# Patient Record
Sex: Male | Born: 1950 | Race: White | Hispanic: No | Smoking: Current some day smoker
Health system: Southern US, Community
[De-identification: ages and names within clinical notes are randomized; demographics above are authoritative.]

## PROBLEM LIST (undated history)

## (undated) DIAGNOSIS — I255 Ischemic cardiomyopathy: Secondary | ICD-10-CM

## (undated) DIAGNOSIS — F419 Anxiety disorder, unspecified: Secondary | ICD-10-CM

## (undated) DIAGNOSIS — E119 Type 2 diabetes mellitus without complications: Secondary | ICD-10-CM

## (undated) DIAGNOSIS — K420 Umbilical hernia with obstruction, without gangrene: Secondary | ICD-10-CM

## (undated) DIAGNOSIS — IMO0001 Reserved for inherently not codable concepts without codable children: Secondary | ICD-10-CM

## (undated) DIAGNOSIS — I252 Old myocardial infarction: Secondary | ICD-10-CM

## (undated) DIAGNOSIS — I2510 Atherosclerotic heart disease of native coronary artery without angina pectoris: Secondary | ICD-10-CM

## (undated) DIAGNOSIS — K08109 Complete loss of teeth, unspecified cause, unspecified class: Secondary | ICD-10-CM

## (undated) DIAGNOSIS — J449 Chronic obstructive pulmonary disease, unspecified: Secondary | ICD-10-CM

## (undated) DIAGNOSIS — I509 Heart failure, unspecified: Secondary | ICD-10-CM

## (undated) DIAGNOSIS — I480 Paroxysmal atrial fibrillation: Secondary | ICD-10-CM

## (undated) DIAGNOSIS — L03116 Cellulitis of left lower limb: Secondary | ICD-10-CM

## (undated) DIAGNOSIS — Z972 Presence of dental prosthetic device (complete) (partial): Secondary | ICD-10-CM

## (undated) DIAGNOSIS — E7800 Pure hypercholesterolemia, unspecified: Secondary | ICD-10-CM

## (undated) DIAGNOSIS — K4090 Unilateral inguinal hernia, without obstruction or gangrene, not specified as recurrent: Secondary | ICD-10-CM

## (undated) DIAGNOSIS — U071 COVID-19: Secondary | ICD-10-CM

## (undated) DIAGNOSIS — I10 Essential (primary) hypertension: Secondary | ICD-10-CM

## (undated) DIAGNOSIS — M069 Rheumatoid arthritis, unspecified: Secondary | ICD-10-CM

## (undated) DIAGNOSIS — Z794 Long term (current) use of insulin: Secondary | ICD-10-CM

## (undated) DIAGNOSIS — K219 Gastro-esophageal reflux disease without esophagitis: Secondary | ICD-10-CM

## (undated) DIAGNOSIS — M359 Systemic involvement of connective tissue, unspecified: Secondary | ICD-10-CM

## (undated) HISTORY — PX: LUMBAR LAMINECTOMY: SHX95

## (undated) HISTORY — PX: CORONARY ANGIOPLASTY: SHX604

## (undated) HISTORY — PX: CARDIAC CATHETERIZATION: SHX172

## (undated) HISTORY — PX: LAPAROSCOPIC CHOLECYSTECTOMY: SUR755

## (undated) HISTORY — DX: Ischemic cardiomyopathy: I25.5

---

## 2002-09-01 ENCOUNTER — Encounter: Payer: Self-pay | Admitting: Emergency Medicine

## 2002-09-01 ENCOUNTER — Emergency Department (HOSPITAL_COMMUNITY): Admission: EM | Admit: 2002-09-01 | Discharge: 2002-09-01 | Payer: Self-pay | Admitting: Emergency Medicine

## 2002-09-05 ENCOUNTER — Ambulatory Visit (HOSPITAL_COMMUNITY): Admission: RE | Admit: 2002-09-05 | Discharge: 2002-09-05 | Payer: Self-pay | Admitting: Cardiovascular Disease

## 2003-03-07 ENCOUNTER — Encounter: Payer: Self-pay | Admitting: *Deleted

## 2003-03-07 ENCOUNTER — Inpatient Hospital Stay (HOSPITAL_COMMUNITY): Admission: EM | Admit: 2003-03-07 | Discharge: 2003-03-10 | Payer: Self-pay | Admitting: *Deleted

## 2003-03-08 ENCOUNTER — Encounter: Payer: Self-pay | Admitting: *Deleted

## 2003-03-27 ENCOUNTER — Ambulatory Visit (HOSPITAL_COMMUNITY): Admission: RE | Admit: 2003-03-27 | Discharge: 2003-03-27 | Payer: Self-pay | Admitting: Cardiology

## 2005-04-10 ENCOUNTER — Ambulatory Visit (HOSPITAL_COMMUNITY): Admission: RE | Admit: 2005-04-10 | Discharge: 2005-04-10 | Payer: Self-pay | Admitting: Cardiology

## 2005-06-01 ENCOUNTER — Encounter: Payer: Self-pay | Admitting: Emergency Medicine

## 2005-06-02 ENCOUNTER — Inpatient Hospital Stay (HOSPITAL_COMMUNITY): Admission: AD | Admit: 2005-06-02 | Discharge: 2005-06-04 | Payer: Self-pay | Admitting: Cardiology

## 2006-04-17 ENCOUNTER — Ambulatory Visit: Payer: Self-pay | Admitting: Internal Medicine

## 2006-04-20 ENCOUNTER — Ambulatory Visit (HOSPITAL_COMMUNITY): Admission: RE | Admit: 2006-04-20 | Discharge: 2006-04-20 | Payer: Self-pay | Admitting: Cardiology

## 2006-05-09 ENCOUNTER — Inpatient Hospital Stay (HOSPITAL_COMMUNITY): Admission: RE | Admit: 2006-05-09 | Discharge: 2006-05-10 | Payer: Self-pay | Admitting: Cardiology

## 2006-09-11 HISTORY — PX: COLONOSCOPY: SHX174

## 2006-12-28 ENCOUNTER — Ambulatory Visit (HOSPITAL_COMMUNITY): Admission: RE | Admit: 2006-12-28 | Discharge: 2006-12-28 | Payer: Self-pay | Admitting: Family Medicine

## 2007-02-09 ENCOUNTER — Inpatient Hospital Stay (HOSPITAL_COMMUNITY): Admission: EM | Admit: 2007-02-09 | Discharge: 2007-02-11 | Payer: Self-pay | Admitting: Emergency Medicine

## 2007-02-26 ENCOUNTER — Ambulatory Visit (HOSPITAL_COMMUNITY): Admission: RE | Admit: 2007-02-26 | Discharge: 2007-02-26 | Payer: Self-pay | Admitting: *Deleted

## 2007-03-01 ENCOUNTER — Ambulatory Visit (HOSPITAL_COMMUNITY): Admission: RE | Admit: 2007-03-01 | Discharge: 2007-03-01 | Payer: Self-pay | Admitting: Cardiology

## 2007-03-12 ENCOUNTER — Encounter (HOSPITAL_COMMUNITY): Admission: RE | Admit: 2007-03-12 | Discharge: 2007-04-11 | Payer: Self-pay | Admitting: Cardiology

## 2007-04-12 ENCOUNTER — Encounter (HOSPITAL_COMMUNITY): Admission: RE | Admit: 2007-04-12 | Discharge: 2007-05-12 | Payer: Self-pay | Admitting: Cardiology

## 2007-04-29 ENCOUNTER — Ambulatory Visit (HOSPITAL_COMMUNITY): Admission: RE | Admit: 2007-04-29 | Discharge: 2007-04-29 | Payer: Self-pay | Admitting: Internal Medicine

## 2007-04-29 ENCOUNTER — Ambulatory Visit: Payer: Self-pay | Admitting: Internal Medicine

## 2007-04-29 ENCOUNTER — Encounter: Payer: Self-pay | Admitting: Internal Medicine

## 2009-08-30 HISTORY — PX: NM MYOCAR PERF WALL MOTION: HXRAD629

## 2010-01-27 ENCOUNTER — Ambulatory Visit (HOSPITAL_COMMUNITY): Admission: RE | Admit: 2010-01-27 | Discharge: 2010-01-27 | Payer: Self-pay | Admitting: Cardiology

## 2010-03-16 ENCOUNTER — Ambulatory Visit: Payer: Self-pay | Admitting: Orthopedic Surgery

## 2010-03-16 DIAGNOSIS — M67919 Unspecified disorder of synovium and tendon, unspecified shoulder: Secondary | ICD-10-CM

## 2010-03-16 DIAGNOSIS — M719 Bursopathy, unspecified: Secondary | ICD-10-CM

## 2010-04-04 ENCOUNTER — Encounter: Payer: Self-pay | Admitting: Orthopedic Surgery

## 2010-10-11 NOTE — Assessment & Plan Note (Signed)
Summary: left shoulder pain needs xr/medicaare/mcgough/bsf   Vital Signs:  Patient profile:   60 year old male Height:      73 inches Weight:      231 pounds Pulse rate:   78 / minute Resp:     16 per minute  Vitals Entered By: Fuller Canada MD (March 16, 2010 9:19 AM)  Visit Type:  new patient Referring Provider:  Dr. Regino Schultze Primary Provider:  Dr. Sherwood Gambler  CC:  left shoulder pain.  History of Present Illness: I saw Robert [name] in the office today for an initial visit.  He is a 60 years old man with the complaint of:  left shoulder pain.  xrays today in our office.  Meds: Metformin, Ticlopidine, Quinapril, Niaspan, Simvastatin, Meloxicam, Cilostazol, Metoprolol, Actos, Glipizide, Hydro 10/650, Nitro as needed, Omega 3, ASA.  Patient today complains of 3-4 months of pain in his LEFT shoulder with no major radiation below the elbow and associated weakness and pain with forward elevation.  His pain is unrelieved by hydrocodone 10 mg.  He denies any trauma.    Allergies (verified): 1)  ! * Ivp Dye  Past History:  Past Medical History: cardiovascular disease diabetes COPD cholesterol  Past Surgical History: L3-5 lumbar circumcision gallbladder 3 heart stents  Family History: FH of Cancer:  Family History of Diabetes Family History Coronary Heart Disease male < 76 Family History of Arthritis Hx, family, chronic respiratory condition  Social History: Patient is married.  disabled smokes 1ppd no alcohol 1 cup of coffee per day  Review of Systems Constitutional:  Denies weight loss, weight gain, fever, chills, and fatigue. Cardiovascular:  Complains of chest pain; denies palpitations, fainting, and murmurs. Respiratory:  Complains of short of breath, wheezing, couch, and snoring; denies tightness, pain on inspiration, and snoring . Gastrointestinal:  Complains of heartburn and diarrhea; denies nausea, vomiting, constipation, and blood in your  stools. Genitourinary:  Denies frequency, urgency, difficulty urinating, painful urination, flank pain, and bleeding in urine. Neurologic:  Complains of numbness, tingling, and unsteady gait; denies dizziness, tremors, and seizure. Musculoskeletal:  Complains of joint pain, instability, and stiffness; denies swelling, redness, heat, and muscle pain. Endocrine:  Denies excessive thirst, exessive urination, and heat or cold intolerance. Psychiatric:  Denies nervousness, depression, anxiety, and hallucinations. Skin:  Denies changes in the skin, poor healing, rash, itching, and redness. HEENT:  Denies blurred or double vision, eye pain, redness, and watering. Immunology:  Complains of seasonal allergies; denies sinus problems and allergic to bee stings. Hemoatologic:  Denies easy bleeding and brusing.  Physical Exam  Skin:  intact without lesions or rashes Cervical Nodes:  no significant adenopathy Psych:  alert and cooperative; normal mood and affect; normal attention span and concentration   Shoulder/Elbow Exam  General:    Well-developed, well-nourished, normal body habitus; no deformities, normal grooming.    Vascular:    Radial, ulnar, brachial, and axillary pulses 2+ and symmetric; capillary refill less than 2 seconds; no evidence of ischemia, clubbing, or cyanosis.    Motor:    Normal strength in the upper extremities.    Reflexes:    Normal reflexes in the upper extremities.    Shoulder Exam:    the patient has approximately 4 elevation of the RIGHT upper extremity and painful elevation of the LEFT upper extremity at the shoulder joint at 80 his passive range of motion is 150 and is painful arc is from 80 up to 150 has a positive impingement sign with Neer  negative with Hawkins strength is normal shoulder is stable no apprehension   Impression & Recommendations: shoulder x-rays LEFT shoulder AP lateral type I acromion normal glenohumeral joint normal arch between the  scapula and the proximal humerus  Shoulder injection LEFT shoulder subacromial space Verbal consent obtained/The shoulder was injected with depomedrol 40mg /cc and sensorcaine .25% . There were no complications  Other Orders: New Patient Level III (16109) Joint Aspirate / Injection, Large (20610) Depo- Medrol 40mg  (J1030) Shoulder x-ray,  minimum 2 views (60454)  Patient Instructions: 1)  You have received an injection of cortisone today. You may experience increased pain at the injection site. Apply ice pack to the area for 20 minutes every 2 hours and take 2 xtra strength tylenol every 8 hours. This increased pain will usually resolve in 24 hours. The injection will take effect in 3-10 days.

## 2010-10-11 NOTE — Letter (Signed)
Summary: No show for appointment  No show for appointment   Imported By: Cammie Sickle 04/12/2010 17:13:09  _____________________________________________________________________  External Attachment:    Type:   Image     Comment:   External Document

## 2010-10-11 NOTE — Letter (Signed)
Summary: History form  History form   Imported By: Jacklynn Ganong 03/18/2010 07:36:31  _____________________________________________________________________  External Attachment:    Type:   Image     Comment:   External Document

## 2011-01-24 NOTE — Cardiovascular Report (Signed)
NAMEDEMONE, LYLES NO.:  [id number]   MEDICAL RECORD NO.:  [id number]          PATIENT TYPE:  OIB   LOCATION:  2856                         FACILITY:  [facility]   PHYSICIAN:  Madaline Savage, M.D.DATE OF BIRTH:  05-06-1951   DATE OF PROCEDURE:  DATE OF DISCHARGE:                            CARDIAC CATHETERIZATION   PROCEDURES PERFORMED:  1. Selective coronary angiography by Judkins technique.  2. Retrograde left heart catheterization.  3. Left ventricular angiography.   COMPLICATIONS:  None.   ENTRY SITE:  Right femoral.   DYE USED:  Omnipaque.   PATIENT PROFILE:  The patient is a 60 year old gentleman who has had  previous coronary artery stenting of the circumflex coronary artery and  has an allergy to PLAVIX and he therefore takes ticlopidine.  He had  emergency cardiac catheterization on 02/09/2007 during acute myocardial  infarction and had acute thrombosis of the mid left circumflex coronary  artery which was in-stent.  Thrombolysis was performed and he was  ultimately discharged from the hospital improve in improved condition.  I saw him in the [place] office, [facility] a few days ago and he was complaining about more right-sided  chest pain.  The patient has a history of smoking.  He has been on  Chantix for his tobacco use and he has  about quit.  The patient is  brought to the cath lab today electively to re-study his coronary  anatomy in view of the recent of the recent problems he has had with  chest pain.  This case went well.  There were no complications and the  patient ultimately left the cath lab with stable vital signs to go to  the holding unit.   RESULTS:  Pressures:  Left ventricular pressure was 110/4.  End  diastolic pressure 10.  Central aortic pressure 105/60, mean of 85, no  aortic valve gradient by pullback technique.   ANGIOGRAPHIC RESULTS:  The left circumflex and left anterior descending  coronary arteries arise from different ostia.  The left Judkins catheter  fits into the circumflex well and selective visualization of that  circumflex is then noted.  There is an old patent stent in the proximal  circumflex before obtuse marginal branch #1.  There is a second stent in  the mid RCA between OM-1 and OM-2 that was the site of the patient's  recent thrombolysis and new stent deployment 2 weeks ago.  That area  looks pristine.  The distal vessel appears basically normal except for  the largest of 3 obtuse marginal branches which is the second branch  showing about a 50% ostial stenosis.  There is collateral flow from the  proximal circumflex to the distal right coronary artery.  There is also  collateralization of the RCA by way of LAD to RCA collaterals.   The left anterior descending coronary artery arising via separate ostia  can also be intubated with a Judkins catheter oriented superiorly and  rotated in a different axis.  The LAD fills well.  I do not see any  significant lesions.  There  is some haziness proximally which I do not  think is significant.  There is one major diagonal branch which appears  normal and the LAD itself looks very normal with large septal perforator  branches.   The right coronary artery is a severely diseased vessel.  The vessel is  essentially occluded at the ostium with homocollaterals that then fill  bits  and pieces of the distal RCA, pulmonary conus and acute marginal.  There is not much significant antegrade flow into the distal RCA in a  antegrade fashion, however.   Left ventricular angiography shows good contractility of all wall  segments, a very, very mild mid inferior wall hypokinesis area and no  evidence of mitral regurgitation.   FINAL IMPRESSIONS:  1. Two-vessel coronary disease      a.     50% obtuse marginal branch #2 stenosis proximally with       patent proximal circumflex and mid circumflex stents.      b.     100%  occluded nondominant right coronary artery at the       ostium with homocollateral flow to the distal RCA, collateral flow       from circumflex to RCA collaterals and also LAD to distal RCA       collaterals d      c.     Patent LAD and diagonal.  2. Mildly depressed LV systolic ejection fraction 50-60%.  3. Acute myocardial infarction 2 weeks ago already showing improvement      in ejection fraction.   PLAN:  Continued medical therapy, continued attempts to try to get the  patient completely off tobacco.  The patient is apparently in the  process of pursuing Social Security disability benefits.  We will see  the patient back in our office July 10 in [place],  [place].           ______________________________  Madaline Savage, M.D.     WHG/MEDQ  D:  03/01/2007  T:  03/01/2007  Job:  [id number]   cc:   in [facility]  [place], [facility],

## 2011-01-24 NOTE — Op Note (Signed)
NAMEJAMARIO, COLINA                ACCOUNT NO.:  [id number]   MEDICAL RECORD NO.:  [id number]          PATIENT TYPE:  AMB   LOCATION:  DAY                           FACILITY:  [facility]   PHYSICIAN:  R. Roetta Sessions, M.D. DATE OF BIRTH:  23-Jun-1951   DATE OF PROCEDURE:  04/29/2007  DATE OF DISCHARGE:                               OPERATIVE REPORT   PROCEDURE:  High-risk screening colonoscopy, colonoscopy with biopsy.   INDICATIONS FOR PROCEDURE:  A 60 year old gentleman with no lower GI  tract symptoms.  He comes for high-risk screening colonoscopy.  He has  never had his lower GI tract imaged, positive family history in both a  brother and mother (brother was diagnosed at age 33).  Colonoscopy is  now being done.  This approach has been discussed the patient at length.  The potential risks, benefits and alternatives have been reviewed,  questions answered.  Please see the documentation in the medical record.   PROCEDURE NOTE:  O2 saturation, blood pressure, pulse and respirations  were monitored throughout the entire procedure.   CONSCIOUS SEDATION:  Demerol 100 mg IV, Versed 5 mg IV in divided doses.   INSTRUMENT:  Pentax video chip system.   FINDINGS:  Digital rectal exam revealed no abnormalities.   ENDOSCOPIC FINDINGS:  The prep was good.   COLON:  The colonic mucosa was surveyed from the rectosigmoid junction  through the left transverse and right colon to the area of the  appendiceal orifice, ileocecal valve and cecum.  These structures were  well seen and photographed for the record.  From this level, the scope  was slowly withdrawn.  All previously mentioned mucosal surfaces were  again seen. The patient was noted to have a few scattered pan colonic  diverticula.  The remainder of the colonic mucosa appeared normal aside  from two diminutive rectosigmoid polyps which were cold  biopsied/removed. The scope was pulled down in the rectum where a  thorough examination of the  rectal mucosa including a retroflexed view  of the anal verge demonstrated no abnormalities.  The patient tolerated  the procedure well and was reacted in endoscopy.   IMPRESSION:  1. Normal rectum, two diminutive polyps of the rectosigmoid  junction (distal sigmoid) status post cold biopsy removal.  1. A few scattered pan colonic diverticula.  Colon mucosa appeared      normal.   RECOMMENDATIONS:  1. Resume Ticlid and aspirin today.  2. Follow-up on path.  3. Diverticulosis literature provided to Mr. Salemi.  4. Would consider family members undergo genetic testing to see if      there is some hereditary colon cancer syndrome gene lurking in the      family tree.  5. Will make further recommendations once the path becomes available.      Jonathon Bellows, M.D.  Electronically Signed     RMR/MEDQ  D:  04/29/2007  T:  04/29/2007  Job:  [id number]   cc:   Madaline Savage, M.D.  Fax: [phone]   Madelin Rear. Sherwood Gambler, MD  Fax: [phone]

## 2011-01-24 NOTE — Cardiovascular Report (Signed)
Robert [name], Robert [name] NO.:  [id number]   MEDICAL RECORD NO.:  [id number]          PATIENT TYPE:  INP   LOCATION:  1828                         FACILITY:  [facility]   PHYSICIAN:  Madaline Savage, M.D.DATE OF BIRTH:  05/16/1951   DATE OF PROCEDURE:  02/09/2007  DATE OF DISCHARGE:                            CARDIAC CATHETERIZATION   PROCEDURES PERFORMED:  1. Selective coronary angiography by Judkins technique.  2. Retrograde left heart catheterization.  3. Left ventricular angiography.  4. Acute thrombolysis with Fetch device.  5. Balloon angioplasty of mid-circumflex coronary artery for in-stent      thrombolysis, right percutaneous femoral artery Angio-Seal closure.   COMPLICATIONS:  None.   ENTRY SITE:  Right femoral arterial.   MEDICATIONS GIVEN:  Fentanyl 50 mg, Versed one, AngioMax infusion with  ACT of greater than 350 seconds.   PATIENT PROFILE:  Robert [name] is a 60 year old white married gentleman  who has had previous intracoronary artery stenting of his circumflex  coronary artery.  The patient has a bona fide allergy to Plavix and  takes Ticlid instead, but omitted taking his last dose of Ticlid.  He  also has continued to smoke and his diet is very poor.  He had his first  episode of chest discomfort around midnight and ended up coming to the  emergency room in the time frame between 4 and 5 a.m. and when  presenting to the emergency room, had acute ST-segment elevation MI with  a 12-lead showing to 3 mm of ST-segment elevations in the inferior  leads, 2-3 mm of ST-segment reciprocal depressions V2-V6 and a heart  rate of 60 in sinus rhythm.  His pain at that time was rated somewhere  between 6 and 7 on a scale from 1-10.  Dr. Doug Sou saw him in the  emergency room and called [place] and we began acute STEMI cath lab protocols  as we usually do.  No complications occurred.   FINDINGS:   PRESSURES:  Left ventricular pressure was 150/18,  end-diastolic pressure  31.  Central aortic pressure was 140/75 with a mean of 100.  There was  no significant aortic valve gradient by pullback technique.  It was  approximately 8 mm which is not considered a clinically significant  gradient.   ANGIOGRAPHIC RESULTS:  The patient has a short left main coronary artery  and the standard Judkins catheters fit the LAD well, but a left Amplatz  configuration catheter with a 1 cm tip was required to get selectively  into the circumflex.  Anatomically, the patient was shown to have a  normal left main with some mild calcification just prior to the first  diagonal branch.  There were lumpy bumpy irregularities throughout this  vessel, but nothing high grade in the LAD or diagonal.  There were 2  major septal perforator branches and one major diagonal branch.  Only  luminal irregularities were seen.   There was a radio-opaque stent in the proximal circumflex followed by a  skip area and then a second radio-opaque stent in the mid-portion of the  circumflex.  The  circumflex showed very, very poor filling with a convex  looking area of 100% occlusion at the inflow portion of the second stent  in the mid-portion of the vessel.  This was felt to be an acute  thrombosis in-stent of what was known to be a Cypher stent that was  approximately 3.5 to 4 mm in diameter.  There was TIMI zero flow beyond  that point.  There was retrograde collateral flow into a right coronary  artery by way of atrial circumflex branches coming off of the proximal  circumflex.   The right coronary artery was a 100% occluded vessel that was  recanalized with reconstitution of the pulmonary conus branch and sinus  node branch and very, very sluggish distal flow into the distal vessel.  There was as previously noted pretty impressive retrograde  collateralization of the distal RCA by way of LAD and circumflex  collaterals.   Left ventricular angiogram showed the lower one  half of the anterior  wall was severely hypokinetic as was the apex and the inferoapical  segment.  Along 2/3 of the inferior wall and along the anterobasal  segments, there was hyperdynamic wall motion and I would estimate  ejection fraction at 45%.  I saw no evidence of mitral regurgitation and  I saw no evidence of LV thrombus at the apex.   Percutaneous coronary intervention into the occluded second stent in the  circumflex was accomplished using an Amplatz left #1 guide catheter, a  Prowater wire, a fetch device and then balloon angioplasty with first a  3.5 mm DuraStar balloon, then a 4.0 x 15 mm DuraStar balloon.  Thrombolysis was accomplished by passage of the fetch device for a total  of three different times with retrieval of grumous-looking material and  plaque.  I would consider the amount of material aspirated was moderate!   After fetch device thrombolysis and then balloon angioplasty, there was  restoration of TIMI III distal flow and resolution of 7/10 chest pain to  1/10 chest pain.  A large obtuse marginal branch was then noted to fill  nicely as was a second smaller obtuse marginal branch and then the  distal circumflex was noted to fill thereafter.   Again, we saw retrograde collateral flow into the RCA system.   No complications occurred during the case.  The patient was given an  extra dose of Ticlid because of his Plavix allergy.  We observed no  urticaria or rashes with the patient's allergy to dye.  He had been  given Benadryl in the emergency room as well as Solu-Medrol.  The  patient will be moved to the CCU.  We will continue AngioMax for an  additional 3 hours and will continue Ticlid 250 mg twice a day  hereafter.           ______________________________  Madaline Savage, M.D.     WHG/MEDQ  D:  02/09/2007  T:  02/09/2007  Job:  [id number]   cc:   [facility]  [facility]

## 2011-01-27 NOTE — Cardiovascular Report (Signed)
Robert [name], Robert [name]                          ACCOUNT NO.:  [id number]   MEDICAL RECORD NO.:  [id number]                   PATIENT TYPE:  OIB   LOCATION:  2854                                 FACILITY:  [facility]   PHYSICIAN:  Madaline Savage, M.D.             DATE OF BIRTH:  06-07-1951   DATE OF PROCEDURE:  09/05/2002  DATE OF DISCHARGE:                              CARDIAC CATHETERIZATION   PROCEDURE PERFORMED:  1. Selective coronary angiography by Judkins technique.  2. Retrograde left heart catheterization.  3. Left ventricular angiography.  (This was an elective outpatient cardiac catheterization).   ENTRY SITE:  Right femoral.   DYE USED:  Omnipaque.   MEDICATIONS GIVEN:  Solu-Medrol 125 mg IV, Pepcid 20 mg p.o., Benadryl 25 mg  p.o.  The patient received those agents pre-catheterization.  During the  catheterization, the patient received Fentanyl 25 mg IV and Versed 1 mg IV.   COMPLICATIONS:  None.  No dye reactions occurred.   PATIENT PROFILE:  The patient is currently a 60 year old white married male  who presented to our office on September 02, 2002, as a work-in for recent  chest pain.   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  A cardiac catheterization then by Dr. Charlton Haws  revealed a total right coronary artery with minimal collateral flow and  basically normal left anterior descending and circumflex arteries.  Ejection  fraction was about 50%.  The patient also has a history of peripheral  vascular disease as well as tobacco use of about 120-pack years over 30  years, diabetes mellitus for the last three years non-insulin dependent.  He  has, as previously mentioned, peripheral vascular disease.  His mother has  had coronary artery disease and one sibling has had CAD.  When seen in our  office, he was encouraged to come right to the hospital on the 23rd but  refused to do so and now presents as an  outpatient for elective  catheterization.   LABORATORY DATA:  Laboratory data pre-catheterization was reviewed and felt  to be acceptable for proceeding with catheterization.  The patient does give  a possible history of some shellfish allergies, so he was premedicated prior  to catheterization.   RESULTS:  1. Pressures     A. The left ventricular pressure was found to be 130/8 with an end        diastolic pressure of 13.     B. Central aortic pressure was 130/85, mean of 105.     C. No significant aortic valve gradient by pullback technique.   ANGIOGRAPHIC RESULTS:  1. The left coronary arteries appear to arise by a functionally separate     ostia.  2. The left Judkins #4 diagnostic catheter entered the circumflex     electively.  There was noted to be a large caliber vessel with  multiple     luminal irregularities throughout the proximal and mid segments.  The     proximal area contained a 50% short segmental lesion.  Another segment of     the proximal vessel contained a discrete 50% stenosis.  The remainder of     the vessel was large in caliber and contained only luminal     irregularities.  Four obtuse marginal branches arise and those OMs appear     normal.  There was a well-developed collateral from an atrial circumflex     branch arising in the proximal circumflex that collateralized the distal     RCA including both PDA and PLA.  3. The left anterior descending coronary artery was a medium-sized vessel     giving rise to one major diagonal branch proximally and three tiny     additional diagonal branches very distally.  There were two to three     medium-sized septal perforator branches.  The LAD was basically normal     except for proximally where there was calcification.  There was felt to     be an ulceration proximally with minimal lesion ranging from 40-50%.     This does appear to represent an ulcerated plaque but no thrombus was     noted.  4. The right coronary  artery was a medium-sized vessel proximally.  Just     after an acute marginal branch, the vessel is occluded 100% with a     recanalized lumen that fills a second acute marginal branch and very     trivial flow to the distal RCA.  5. The left ventricle is at the upper limits of normal in terms of size.     Contractility is good.  Ejection fraction estimate is 55-65%.  There is     inferior basal and one-half of the inferior wall contains mild     hypokinesis.  There is no mitral regurgitation seen.  There is no LV     thrombus.  6. Abdominal aortography failed to show any evidence of abdominal aortic     pathology.  Both renal arteries appeared normal.  The right renal artery     required a selective visualization with a right coronary catheter because     of a pseudostenosis that was related to nonselective filling with the     pigtail catheter.  On selective visualization, the left renal artery     appeared normal.   FINAL DIAGNOSES:  1. Unstable angina with multiple coronary risk factors.  2. Coronary artery disease.     A. A 100% recanalized occlusion of the proximal to mid right coronary        artery.     B. Ulcerated plaque in left anterior descending artery, 40-50% severe.     C. Mild disease in the left circumflex proximal.  3. Good left ventricular systolic function with old inferobasal infarct.  4. Normal abdominal aorta and normal renal arteries.   RECOMMENDATIONS:  The patient has been thoroughly advised to stop smoking.  He should be on aspirin and Plavix for a year due to his unstable angina,  tobacco use, and mild ulcerated plaque without thrombus.  He will  follow up with Dr. Ouida Sills, his primary care physician, in [place] and will  see [place] also in the [place] or [facility] office for continued smoking  counseling and continued management of both his mild coronary disease and his left lower extremity claudication and known peripheral vascular disease.  Madaline Savage, M.D.    WHG/MEDQ  D:  09/05/2002  T:  09/05/2002  Job:  [id number]   cc:   Kingsley Callander. Ouida Sills, M.D.  [address]  [place]  [place] [address]  Fax: [phone]   [facility]   [facility] [place] [facility]

## 2011-01-27 NOTE — Discharge Summary (Addendum)
NAMESUEDE, Robert [name] NO.:  [id number]   MEDICAL RECORD NO.:  [id number]          PATIENT TYPE:  INP   LOCATION:  6531                         FACILITY:  [facility]   PHYSICIAN:  Raymon Mutton, P.A. DATE OF BIRTH:  05-09-1951   DATE OF ADMISSION:  05/09/2006  DATE OF DISCHARGE:  05/10/2006                                 DISCHARGE SUMMARY   DATE OF ADMISSION:  May 09, 2006   DATE OF DISCHARGE:  May 10, 2006   ATTENDING PHYSICIAN:  Chanda Busing, M.D.   DISCHARGE DIAGNOSES:  1. Endoscopic retrograde cholangiopancreatogram- status post percutaneous      coronary intervention to distal left circumflex during this admission.  2. Known coronary artery disease status post diaphragmatic myocardial      infarction in 1995, status past coronary artery bypass grating in      September 2006 with stenting of the circumflex (drug-eluting CYPHER      stent and plain old balloon angioplasty by Dr. Tresa Endo).  3. Diabetes mellitus type 2.  4. Hypertension.  5. Mild left ventricular dysfunction with ejection fraction 45%.  6. Chronic obstructive pulmonary disease.  7. Peripheral vascular disease of the left lower extremity.  8. Ongoing tobacco use.   HOSPITAL COURSE:  This is a  60 year old gentleman, who was admitted to  [facility] for intervention after he underwent a stress test  revealing abnormal result with potential blockage in one of the coronaries.  Patient was brought to the hospital through as short-stay unit and underwent  coronary angiography by Dr. Elsie Lincoln on May 09, 2006.  Cath revealed patent  stent in the proximal circumflex, but distal circumflex had a tight 99%  stenotic lesion.  Dr. Elsie Lincoln performed angioplasty and used Cypher stent  with the reduction of the lesion from 99% to 0%.  During the intervention,  POBA technique was used.   Patient tolerated procedure well, was transferred to the floor in stable  condition.  There were no  immediate or delayed complications post procedure.  The next morning, he was visited by Dr. Alanda Amass.  He had no signs of  ecchymosis or hematoma and patient was discharged home in stable condition.   DISCHARGE INSTRUCTIONS:  Not to drive, not to lift greater than 5 pounds for  3 days.   DISCHARGE DIET:  Low-fat, low-cholesterol diet.   DISCHARGE MEDICATIONS:  1. Accupril 10 mg daily.  2. Metoprolol 50 mg b.i.d.  3. ____ QA MARKER: 331 ____ 100 m  4. Lexapro 10 mg daily.  5. Glucophage 5000 mg b.i.d.  Patient was instructed to keep no hold and      resume on Saturday, September 1.  6. Aspirin 325 mg daily.  7. Ticlopidine 250 mg daily.  8. Chantix 1 mg b.i.d.  9. Nitroglycerin 0.4 mg sublingual p.r.n.  10.Xanax as needed 0.5 mg.   Patient was instructed to discontinue Ismo (isosorbide mononitrate).   DISCHARGE FOLLOWUP:  Dr. Elsie Lincoln will see patient in office in [place] on  May 29, 2006, at 12:30 p.m.      [place], [place].A.  MK/MEDQ  D:  05/10/2006  T:  05/10/2006  Job:  [id number]   cc:   Madaline Savage, M.D.

## 2011-01-27 NOTE — Op Note (Signed)
NAMETOIVO, BORDON                          ACCOUNT NO.:  [id number]   MEDICAL RECORD NO.:  [id number]                   PATIENT TYPE:  OIB   LOCATION:  2899                                 FACILITY:  [facility]   PHYSICIAN:  Cristy Hilts. Jacinto Halim, M.D.                  DATE OF BIRTH:  [birth year]   DATE OF PROCEDURE:  03/27/2003  DATE OF DISCHARGE:  03/27/2003                                 OPERATIVE REPORT   PROCEDURE PERFORMED:  Peripheral angiography.   INDICATIONS FOR PROCEDURE:  Mr. Gurvir Schrom is a 60 year old gentleman with  a history of diabetes, hypertension, smoking, coronary artery disease and  known occluded left superficial femoral artery by angiography in 1995. He  presents with increasing and worsening lower extremity claudication. During  this he was brought back to the catheterization laboratory  to reevaluate  his anatomy for possible revascularization.   DATA:  Abdominal aortogram. The abdominal aortogram revealed no evidence of  abdominal aortic aneurysm. There is no significant atherosclerotic changes  noted. The lumen is smooth. There are 2 renal arteries, 1 on either  side  and they are normal.   The aortoiliac bifurcation is widely patent.   Left femoral artery with femoral runoff revealed widely patent iliac artery  which is smooth.   The left common femoral artery before its bifurcation at the profunda and  the superficial femoral artery have a very small  aneurysmal dilatation  measuring approximately 1.3 cm in diameter. Otherwise the superficial  femoral artery itself is normal, until in the distal  segment it is just  outside of the Hunter's canal is completely occluded short segment which  reconstitutes at the knee. Below the knee there is 3 vessel runoff.   Right iliac artery angiography with runoff.  The right iliac angiography  with runoff revealed normal runoff with normal iliac arteries, normal  superficial femoral arteries with 3 vessel runoff noted  in the leg.   IMPRESSION:  1. 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  2. Occluded distal left superficial femoral artery which reconstitutes at     the knee.  3. Three-vessel runoff noted in both the legs.   RECOMMENDATIONS:  Medical therapy is advised. If the patient's symptoms  worsen, consideration can be made for femoral popliteal bypass surgery on  the left side. The aneurysm of the left common femoral artery appears to be  stable. Risk factor modification is again indicated.   DESCRIPTION OF PROCEDURE:  Under usual sterile precautions using the 5  [place] _______, a 5 French  pigtail catheter was advanced to the abdominal  aorta and abdominal  angiography  was performed. Then the catheter was  utilized to engage the left common iliac artery, and using a 0.035 inch  Wholey wire, the left common iliac artery was cannulated with a 5 [place]  endhole  catheter and selective angiography  was performed. This catheter was  readvanced into the left femoral artery and again angiography at the femoral  artery was performed. Then the catheters were withdrawn in the usual  fashion.   The patient tolerated the procedure well. There were no complications noted.                                               Cristy Hilts. Jacinto Halim, M.D.    Pilar Plate  D:  03/27/2003  T:  03/28/2003  Job:  [id number]   cc:   Madaline Savage, M.D.  [address]  [place]  [place] [address]  Fax: [phone]

## 2011-01-27 NOTE — Consult Note (Signed)
NAMEVINNIE, GOMBERT                ACCOUNT NO.:  [id number]   MEDICAL RECORD NO.:  [id number]           PATIENT TYPE:  AMB   LOCATION:                                FACILITY:  [facility]   PHYSICIAN:  R. Roetta Sessions, M.D. DATE OF BIRTH:  02/21/1951   DATE OF CONSULTATION:  DATE OF DISCHARGE:                                   CONSULTATION   REASON FOR CONSULTATION:  Positive family history of colon cancer, needs  screening.   Mr. Andri Prestia is a pleasant 60 year old Caucasian male sent over courtesy  of Dr. Artis Delay for consideration of colorectal cancer screening.  Mr.  Kakar has a tendency towards loose stools, has not passed any blood or had  any other GI symptoms.  Family history is significant in that I diagnosed  colorectal cancer in his 48 year old brother one year ago and his 75-year-  old mother a couple of years ago.  Mr. Hillis tells me he has had some type  of endoscopic procedure at [facility] about 10 years ago.  He was told he  had lesions but they were taken care of.  He has not lost any weight, no  upper GI tract symptoms such as odynophagia, dysphagia, no signs of reflux  symptoms, nausea or vomiting.  There has been no melena. He is never  constipated.  He is followed by Dr. Elsie Lincoln for coronary artery disease, had  a stress test yesterday.  He is to follow up with Dr. Elsie Lincoln for results.  He was having some vague symptoms concerning for angina previously for which  his regimen was modified according to Mr. Antonson, by Dr. Elsie Lincoln with  resolution of those symptoms.   PAST MEDICAL HISTORY:  Significant for type 2 diabetes mellitus,  hypertension, depression, anxiety and neurosis.   PAST SURGICAL HISTORY:  Back surgery x3.  He has a history of coronary  stents, status post cholecystectomy.   MEDICATIONS:  1. Metoprolol 50 mg twice daily.  2. Pletal 100 mg twice daily.  3. Lexapro 10 mg daily.  4. Ticlopidine 250 mg twice daily.  5. Imdur 20 mg twice daily.  6. Metformin 500 mg twice daily.  7. Accupril 20 mg daily.  8. ASA 325 mg daily.  9. Nitroglycerin as needed.  10.Xanax 0.5 mg four times daily.  11.Chantix.   ALLERGIES:  PLAVIX.   FAMILY HISTORY:  Mother is alive at age 71 with history of colon cancer,  status post resection.  Father died age 40 with coronary artery disease. He  has one brother as stated above.   SOCIAL HISTORY:  The patient is divorced and has three children.  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  He smokes two packs of  cigarettes per day and is trying to quit currently.  No alcohol, no illicit  drugs.   REVIEW OF SYSTEMS:  No recent chest pain, dyspnea on exertion, no fever,  chills, no change in weight.  Otherwise GI as above.   PHYSICAL EXAMINATION:  GENERAL:  The patient is a pleasant 60 year old  gentleman resting comfortably, weight 232, height 6 feet.  VITAL SIGNS:  Temperature 98.4, blood pressure 142/84, pulse 64, skin warm  and dry.   There is no jaundice, no stigma of chronic liver disease.  HEENT:  No scleral icterus.  Jugular vein is not prominent.  CHEST:  Lungs clear to auscultation.  CARDIOVASCULAR:  Regular rate and rhythm.  ABDOMEN:  Nondistended, positive bowel sounds, soft and nontender without  appreciable mass or organomegaly.  EXTREMITIES:  No edema.  RECTAL:  Deferred until time of colonoscopy.   IMPRESSION:  Mr. Tenzin Edelman is a very pleasant 60 year old gentleman sent  over by Madelin Rear. Fusco, MD for consideration of screening colonoscopy.  He has a marked family history of colon cancer in two first degree  relatives.  He is somewhat overdue for colon cancer screening.  Aside from  tendency towards loose stools at times, he is devoid of any GI tract  symptoms.   RECOMMENDATIONS:  Plan for screening colonoscopy as soon as this can be  arranged.  Potential risks, benefits and alternatives have been reviewed and  questions answered.  He is  agreeable.   I would like to thank Dr. Artis Delay for allowing me to see this nice  gentleman today.      Jonathon Bellows, M.D.  Electronically Signed     RMR/MEDQ  D:  04/17/2006  T:  04/17/2006  Job:  [id number]   cc:   Madelin Rear. Sherwood Gambler, MD  Fax: [phone]

## 2011-01-27 NOTE — Discharge Summary (Signed)
Robert [name], Robert [name]                          ACCOUNT NO.:  [id number]   MEDICAL RECORD NO.:  [id number]                   PATIENT TYPE:  INP   LOCATION:  6533                                 FACILITY:  [facility]   PHYSICIAN:  Kem Boroughs, M.D.                 DATE OF BIRTH:  [birth year]   DATE OF ADMISSION:  03/08/2003  DATE OF DISCHARGE:  03/10/2003                                 DISCHARGE SUMMARY   DISCHARGE DIAGNOSES:  1. Unstable angina, resolved, status post catheterization this admission.  2. Known coronary artery disease.  3. Known peripheral vascular disease with symptomatic claudication.  4. Adult onset diabetes mellitus.  5. Hypertension.  6. Hyperlipidemia.  7. Depression.  8. Continuous tobacco abuse.   HISTORY OF PRESENT ILLNESS:  This is a 60 year old married male, a patient  of Dr. Elsie Lincoln.  He presented to [facility] with chest pain.  He was  admitted on rule out MI protocol and scheduled at [facility] for  catheterization.  His troponins were positive on admission to [facility].  The patient had a history of chest pain for approximately a  couple of weeks.  The pain radiated to the left axilla and upper part of the  chest.   HOSPITAL COURSE:  He was assessed by Dr. Domingo Sep at the time of transfer to  [facility] and was found to be in stable condition for scheduled  cardiac catheterization the next day.  We changed his injections of Lovenox  to IV heparin so that it would easier to discontinue and call the cath lab.  He was maintained on beta blockers and IV nitroglycerin.  His Glucophage and  ACE inhibitors were on hold.  Cardiac catheterization was performed on March 09, 2003, by Dr. Jacinto Halim.  The estimated ejection fraction was 60%.  Normal  left ventricular function without any hypercontractility regions.  His RCA  had an old occlusion and was compensated by type 3 collaterals to RCA from  circumflex.  The circumflex  coronary artery had diffuse disease.  A 99%  ulcerated lesion was found in the mid portion of the circumflex with 40-50%  stenosis of the proximal portion.  Successful PTCA and stenting of the mid  circumflex was performed using the heparin-coated ______ stent.  The  stenosis was reduced from 99% to 0% without any evidence of dissection or  thrombus at the end of the procedure.  The patient tolerated the procedure  well and there were no complications post procedure.  The next morning he  was assessed by Dr. Jacinto Halim and was found to be in stable condition.  The  vital signs were stable with a blood pressure of 114/72, pulse 66, and he  was afebrile.  The BUN was 11, creatinine 1.0, sodium 137, and potassium  4.2.  The serum glucose was elevated to 117.  The CBC  showed white blood  cell count 16.7, hemoglobin 16.3, hematocrit 47.2, and platelet count 153.   Dr. Jacinto Halim deemed the patient stable for discharge home.  He was okay after  his ambulating.  His groin did not have any signs of bleeding, although soon  after the procedure he developed hematoma.  Pressure was applied and held  for 10-15 minutes and the hematoma was expressed, after which pressure was  reapplied and checked every 15 minutes.   DISCHARGE MEDICATIONS:  1. Ticlid 250 mg one p.o. b.i.d. indefinitely.  2. Toprol XL 50 mg daily.  3. Zocor 20 mg daily.  4. Aspirin 81 mg daily.  5. Glucophage 500 mg.  The patient was instructed to restart on March 12, 2003.  6. HCTZ 25 mg daily.  7. Nitroglycerin 0.4 mg p.r.n.   ACTIVITY:  No driving.  No lifting greater than 5 pounds for three days.   DISCHARGE DIET:  Low-fat, low-cholesterol, low-carbohydrate diet.   DISCHARGE FOLLOWUP:  Lower extremity ultrasound scheduled on March 17, 2003,  at 2 p.m.  Follow up with Dr. Elsie Lincoln.  Stress test Cardiolite to assess  myocardial perfusion and have a baseline for Cardiolite will be scheduled as  an outpatient.  The office will contact the  patient.     Raymon Mutton, P.A.                    Kem Boroughs, M.D.    MK/MEDQ  D:  03/20/2003  T:  03/21/2003  Job:  [id number]   cc:   Madaline Savage, M.D.  [address]  [place]  [place] [address]  Fax: [phone]   Kingsley Callander. Ouida Sills, M.D.  [address]  [place]  [place] [address]  Fax: [phone]    cc:   Madaline Savage, M.D.  [address]  [place]  [place] [address]  Fax: [phone]   Kingsley Callander. Ouida Sills, M.D.  [address]  [place]  [place] [address]  Fax: [phone]

## 2011-01-27 NOTE — Discharge Summary (Signed)
Robert [name], Robert [name]                          ACCOUNT NO.:  [id number]   MEDICAL RECORD NO.:  [id number]                   PATIENT TYPE:  INP   LOCATION:                                       FACILITY:   PHYSICIAN:  Mila Homer. Sudie Bailey, M.D.           DATE OF BIRTH:  Feb 22, 1951   DATE OF ADMISSION:  03/07/2003  DATE OF DISCHARGE:  03/08/2003                                 DISCHARGE SUMMARY   HISTORY OF PRESENT ILLNESS:  This 60 year old was admitted to the hospital  with a 2-week history of chest pain with exertion.  The pain radiated to the  left shoulder and started on and off according to his wife.  The pain got  worse the night of admission.  He felt weak and so he came to the hospital  for evaluation.   He had a 2-day hospitalization extending from June 26 to March 08, 2003.  Vital signs remained stable.  He was admitted to the hospital by Dr. Marcelino Duster, hospitalist.   His admission troponin was 0.8 peaked at 0.14.  On the morning of discharge  and slightly later it dropped to 0.10.  His admission CK/MB was 2.1.  His  CPK 103.  His B-MET was normal except for glucose of 211.  His INR was 1.0,  PTT 28.  CBC showed a white cell count slightly elevated at 10,700, H&H 17.1  and 49.7 with MCV of 88.   HOSPITAL COURSE:  Treatment in the hospital included Lovenox 1 mg/kg, 7 mg  increments (subcu daily), IMDUR 30 mg p.o. daily.  IV of normal saline, 20  mEq of KCl per liter at 50 cc an hour and ASA 325 mg daily.  Lipitor 10 mg  daily. Toprol XL 25 mg daily, HCTZ 25 mg daily, nitroglycerin 0.4 mg  sublingually p.r.n. chest pain, and sliding scale regular insulin.   He did well on this regimen.  The chest pain actually cleared and he felt  much better and was actually ready to go home the morning of admission, but  with the elevation of his troponin he and his family realized that it would  be smarter to make a trip down to [facility] to see his  cardiologist.   I did discuss his case at length with Dr. Letitia Libra the morning of admission  and later when all troponins were back with Dr. Kem Boroughs, cardiologist  with [facility].  It was felt prudent to transfer him by  [facility] given his cardiac condition.  Arrangements were made.   FINAL DISCHARGE DIAGNOSES:  1. Myocardial infarction.  2. Coronary artery disease.  3. Noninsulin-dependent diabetes mellitus.  4. Hypercholesterolemia.  5. Essential hypertension.  6. Depression.  7. Status post cholecystectomy.  8. Status post cervical laminectomy.  9. Tobacco use disorder.  10.     Family history for coronary artery disease (dad died at age  61 of CAD and     mom is currently age 66 and has CAD).  11.      Family history of lung cancer (in his mom).                                               Mila Homer. Sudie Bailey, M.D.    SDK/MEDQ  D:  03/08/2003  T:  03/08/2003  Job:  [id number]   cc:   Kingsley Callander. Ouida Sills, M.D.  [address]  [place]  [place] [address]  Fax: [phone]

## 2011-01-27 NOTE — H&P (Signed)
NAMEJASMIN, TRUMBULL                          ACCOUNT NO.:  [id number]   MEDICAL RECORD NO.:  [id number]                   PATIENT TYPE:  INP   LOCATION:  A212                                 FACILITY:  [facility]   PHYSICIAN:  Gracelyn Nurse, M.D.              DATE OF BIRTH:  01/19/1951   DATE OF ADMISSION:  03/07/2003  DATE OF DISCHARGE:                                HISTORY & PHYSICAL   CHIEF COMPLAINT:  Chest pain.   HISTORY OF PRESENT ILLNESS:  This is a 60 year old white male with a history  of coronary artery disease.  He presents tonight with a two week history of  chest pain with exertion.  The pain comes on with any type of exertion.  It  radiates to the left shoulder.  He does not get short of breath.  He does  have some nausea.  Last night the pain awoke him.  Today he felt weak and  had worse chest pain when he walked around his house.   PAST MEDICAL HISTORY:  1. Coronary artery disease.     a. History of inferior wall MI in 1995.     b. Cath in December 2003 which showed ulcerative pikes in the LAD 40-50%,        mild disease in the left circumflex and 100% re-cannulized occlusion        of the proximal to mid right coronary artery.  2. Non-insulin-dependent diabetes.  3. Hyperlipidemia.  4. Hypertension.  5. Depression.  6. Status post cholecystectomy.  7. Status post cervical laminectomy.   ALLERGIES:  1. PLAVIX.  2. Questionable CONTRAST DYE.   CURRENT MEDICATIONS:  1. Glucophage 500 mg b.i.d.  2. Wellbutrin 150 mg daily, currently not taking this.  3. Toprol XL 25 mg daily.  4. Hydrochlorothiazide 25 mg daily.  5. Enteric coated aspirin 325 mg daily.  6. A statin that he could not name daily but not taking anymore.   SOCIAL HISTORY:  He smokes two packs of cigarettes a day.  Does not drink  alcohol.  He is married with three children and three step-children.   FAMILY HISTORY:  Mother is age 50.  She has lung cancer, coronary artery  disease and  peripheral vascular disease.  Father died at age 66 of coronary  artery disease.   REVIEW OF SYSTEMS:  As per HPI.  Remainder of systems are negative.   PHYSICAL EXAMINATION:  VITAL SIGNS:  Temperature is 98.8, pulse 92,  respirations 18, blood pressure 154/89.  GENERAL:  This is a well nourished white male in no acute distress.  HEENT:  Pupils equal, round, reactive to light.  Extraocular movements  intact.  Oral mucosa is moist.  Oropharynx is clear.  CARDIOVASCULAR:  Regular, rate and rhythm.  No murmurs.  LUNGS:  Clear to auscultation.  ABDOMEN:  Soft, nontender, nondistended.  Bowel sounds positive.  EXTREMITIES:  No  edema.  NEUROLOGIC:  Cranial nerves II-XII are grossly intact.  No focal deficits.   EKG shows some mild T wave flattening in the inferior leads and also Q waves  in the inferior leads.   ADMITTING LABS:  White blood cells 10.7, hemoglobin is 17.1.  INR is 1.  CK  is 103, troponin I is 0.08.  Sodium 135, potassium 3.6, chloride 99, CO2 29,  BUN 12, creatinine 1.1, glucose 211.   ASSESSMENT/PLAN:  1. Chest pain.  This patient does have known coronary artery disease.  His     symptoms suggest that he could possibly be having ischemia with exertion.     The ER physician talked with Dr. Tresa Endo who was on call for Dr. Orvan Falconer     who recommended admission for rule out and then transfer on Monday for     possible heart catheterization to [facility].  The patient is currently     pain free and stable.  So we will go ahead and admit him here on     telemetry and we will start Lovenox and add back a statin also some     p.r.n. nitro since he is pain free we will continue his beta-blocker and     aspirin.  He does state that he was on some type of blood thinner for his     coronary artery disease but he could not name it.  2. Coronary artery disease.  As above.  He will need a catheterization for     further evaluation.  This was planned for Monday at [facility].      Patient will remain here as long as he is stable.  3. Non-insulin-dependent diabetes.  We will hold his Glucophage pending the     heart cath and start him on some sliding scale insulin for now.  4. Hypertension.  We will continue his current medications.  5. Hyperlipidemia.  We will go ahead and restart a statin.  6. Questionable contrast dye allergy.  I am not clear on this.  He was not     clear if it was the contrast dye or not but he said he began to swell and     break out in a rash and they had to give him Benadryl for it.  So, I do     recommend pre-medicating him before his heart cath.                                               Gracelyn Nurse, M.D.    JDJ/MEDQ  D:  03/07/2003  T:  03/07/2003  Job:  [id number]

## 2011-01-27 NOTE — Cardiovascular Report (Signed)
NAMEHARTLEY, URTON                          ACCOUNT NO.:  [id number]   MEDICAL RECORD NO.:  [id number]                   PATIENT TYPE:  INP   LOCATION:  6533                                 FACILITY:  [facility]   PHYSICIAN:  Cristy Hilts. Jacinto Halim, M.D.                  DATE OF BIRTH:  03/02/1951   DATE OF PROCEDURE:  DATE OF DISCHARGE:                              CARDIAC CATHETERIZATION   PROCEDURE PERFORMED:  1. Left ventriculography.  2. Selective left and right coronary arteriography.  3. Percutaneous transluminal coronary angioplasty and stenting of the mid-     circumflex coronary artery.  4. Intracoronary nitroglycerin administration.  5. Adjuvant __________.   INDICATIONS:  Mr. Alias Villagran is a 60 year old Caucasian male with a  history of coronary artery disease by cardiac catheterization on September 05, 2002, which had revealed 40-50% stenosis in his LAD and circumflex  coronary artery and known occluded right coronary artery at that time, who  is on aggressive medical therapy, who has continued to smoke.  Was  readmitted to the hospital with chest pain suggestive of unstable angina.  Given his significant cardiac history, he was brought back to the cardiac  catheterization lab to evaluate coronary anatomy.   HEMODYNAMIC DATA:  1. The left ventricular pressure was 108/9 with an end-diastolic pressure of     12 mmHg.  2. The aortic pressures were 110/72 with a mean of 90 mmHg.  There was no     pressure gradient across the aortic valve.   ANGIOGRAPHIC DATA:  1. Left ventricle:  Left ventricular systolic function was normal.  The     ejection fraction was estimated at 60%.  There was no wall motion     abnormality.  There was no significant mitral regurgitation.  2. Right coronary artery:  The right coronary artery is a large-caliber     vessel which is a dominant vessel.  It is occluded in its proximal     segment.  A distal branch is supplied by collaterals mostly from  the     circumflex coronary artery.  3. Left main coronary artery:  The left main coronary tree is nonexistent.  4. Circumflex coronary artery:  The circumflex has a separate ostial origin.     It gives a large type 3 collateral to the right coronary artery.  Just     after the collateral there is a high-grade 99% ulcerated stenosis.  There     is a large obtuse marginal 1, and the circumflex itself __________ large     obtuse marginal 2.  The proximal circumflex coronary had about 40-50%     stenosis and the distal had mild luminal irregularity.  5. Left anterior descending artery:  The LAD has a separate ostium.  The LAD     has mild diffuse disease constituting 20-30% stenosis, gives origin to a  moderate-sized diagonal 1.  The LAD wraps around the apex.   IMPRESSION:  1. Normal left ventricular systolic function, ejection fraction 60%.  2. Occluded right coronary artery, which is collateralized mostly from the     circumflex coronary artery.  It was noted on previous cardiac     catheterization on September 05, 2002.  3. Circumflex coronary artery has diffuse disease, proximal 40-50%, and the     mid segment after the collateral to the right had a 99% ulcerated     stenosis.  This is new compared to the December 2003 angiogram.  4. The left anterior descending coronary artery has mild disease in the     proximal and mid segment.  It has a separate ostium.   INTERVENTION DATA:  Successful PTCA and stenting of the midcircumflex  coronary artery.  A 4.0 x 18 mm BX heparin-coated Velocity stent was  deployed at 14 atmospheres pressure for 30 seconds.  This stent was post-  dilated with a 4.5 x 8 mm Quantum at 12 atmospheres of pressure, which would  yield a 4.25 mm vessel.  The stenosis was reduced from 99% to 0% with TIMI-3  to TIMI-3 flow maintained with no evidence of dissection or any evidence of  thrombus at the end of this procedure.   RECOMMENDATIONS:  Continue risk factor  modification (SMOKING CESSATION AND  LIPID-LOWERING THERAPY AS INDICATED).   TECHNIQUE OF THE PROCEDURE:  Under the usual sterile precautions using a 6  French right femoral artery access, a 6 [place] multipurpose pigtail catheter  was advanced to the ascending aorta over a 0.035 inch J-wire.  The catheter  was then advanced to the left ventricle, and the left ventricular pressures  were monitored.  Hand contrast injection of the left ventricle was performed  both in LAO and RAO projection.  The catheter was flushed saline and pulled  back into the ascending aorta and the right coronary artery was selectively  engaged and angiography was performed.  Then the circumflex coronary was  subselectively cannulated and angiography was performed.  Then the catheter  was withdrawn out of the body and a 6 [place] AL-1 diagnostic catheter was  advanced into the ascending aorta in a similar fashion and the left  circumflex coronary artery was selectively cannulated and angiography was  repeated.  The catheter was manipulated to engage the left anterior  descending artery, and angiography was again completed.  Then the catheter  was pulled out of the body in the usual fashion.   TECHNIQUE OF INTERVENTION:  A 7 French AL-1 guide catheter was advanced into  the ascending aorta and the left circumflex coronary artery was selectively  cannulated.  Then a  190 cm x 0.014 inch Asahi ProWater guidewire was  utilized to cross the circumflex coronary artery, which was easily done.  The tip of the wire was carefully positioned in the distal circumflex.  Then  a 4.0 x 20 mm CrossSail balloon was advanced into the circumflex coronary  artery, and 6 and 8 atmosphere pressure balloon dilatation was performed at  26 and 46 seconds, respectively.  The balloon was deflated, pulled back in  the guiding catheter, and arteriography was performed.  Intracoronary nitroglycerin was also administered.  Because of residual  stenosis and  haziness in the mid segment, a 4.0 x 18 mm heparin-coated stent was advanced  over this guidewire after removing the CrossSail balloon and after  confirming the position of the stent, the stent was deployed  at 14  atmospheres of pressure for 30 seconds.  The stent balloon was deflated,  pulled back into the guiding catheter.  Arteriography was performed.  Then a  4.5 x 8 mm Quantum Maverick was utilized and multiple in-stent balloon  dilatation was performed at 12 atmospheres of pressure from 11 to 32  seconds.  The balloon was deflated, pulled back into the guiding catheter,  and arteriography was repeated.  Excellent results were noted.  The side  branches and also the collaterals to the right coronary artery were not  compromised.  Then the guidewire was withdrawn, angiography was repeated,  and then the guide catheter disengaged and pulled out of the body in the  usual fashion.  The patient tolerated the procedure well.                                               Cristy Hilts. Jacinto Halim, M.D.    Pilar Plate  D:  03/09/2003  T:  03/10/2003  Job:  [id number]  Kem Boroughs, M.D.  [address]  [place]  [place] [address]  Fax: [phone]   Kirk Ruths, M.D.  [address]  [place]  [place] [address]  Fax: [phone]   Kingsley Callander. Ouida Sills, M.D.  [address]  [place]  [place] [address]  Fax: [phone]   cc:   Kem Boroughs, M.D.  [phone]  [place]  [place] [address]  Fax: [phone]   Kirk Ruths, M.D.  [address]  [place]  [place] [address]  Fax: [phone]   Kingsley Callander. Ouida Sills, M.D.  [address]  [place]  [place] [address]  Fax: [phone]

## 2011-01-27 NOTE — Discharge Summary (Signed)
Robert [name] NO.:  [id number]   MEDICAL RECORD NO.:  [id number]          PATIENT TYPE:  INP   LOCATION:  2914                         FACILITY:  [facility]   PHYSICIAN:  Madaline Savage, M.D.DATE OF BIRTH:  1950/10/12   DATE OF ADMISSION:  02/09/2007  DATE OF DISCHARGE:  02/11/2007                               DISCHARGE SUMMARY   [name] is a 60 year old male patient of Dr. Chanda Busing who  came in the hospital with an ST-elevation MI.  He had known previous  coronary artery disease.  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  He has been  intolerant to PLAVIX and is on Ticlid. However, he missed his doses of  Ticlid apparently the day before admission and then started having chest  pain.  He was taken emergently to the catheterization lab by Dr. Elsie Lincoln  and found to have thrombosis in his circumflex Cypher stent.  He  underwent fetch device thrombolysis and then PTCA of his mid circumflex.  He had no complications. The following day, he was feeling better. Plan  was transfer him to [address], and Dr. Elsie Lincoln felt he could go back to work  in 3 weeks after his discharge.   On February 11, 2007, he apparently remained in intensive care. Transfer  delayed because of bed availability.  He was seen by Dr. Elsie Lincoln. The  patient really wanted to go home. Dr. Elsie Lincoln felt he was okay for  discharge.  He recommended he return to work March 11, 2007, and have an  echocardiogram in 1 week.  His blood pressure on the day of discharge  was 126/72. Heart rate was 62.   LABORATORY DATA:  His hemoglobin was 16.5, hematocrit of 48.7, WBC 10,  platelets  170.  His sodium was 137, potassium 3.6, BUN 13, creatinine  0.92, glucose 168.  His AST was 40. ALT was 29. CK-MB #1 was 112/2.1,  troponin 0.02; number two was 1563/136 with troponin of  33.58. Total  cholesterol was 140, HDL 30, LDL 80, triglycerides 149. TSH was 1.374.   X-ray showed borderline cardiomegaly and mild pulmonary venous  congestion.   DISCHARGE MEDICATIONS:  1. Metformin 500 mg two times a day.  2. Protonix 40  mg a day/  3. Aspirin 325 mg day.  4. Metoprolol 50 mg twice a day.  5. Simvastatin 80 mg at bedtime.  6. Quinapril  20 mg a day.  7. Ticlid 250 mg 2 tablets per day.  8. Chantix Starter Pack and then 1 mg two times a day.  9. Glipizide 10 mg a day.  10.Nitroglycerin as needed p.r.n.   Our office will call for an appointment.  He will follow up with Dr.  Elsie Lincoln   DISCHARGE DIAGNOSES:  1. Non-ST elevation myocardial infarction, inferior .  Emergent      catheterization with in-stent thrombus and thrombolysis performed      with percutaneous transluminal cardiac angioplasty.  2.  Previous coronary artery disease.  3. Allergy to PLAVIX.  The patient has been on Ticlid, but missed some      doses.  4. Non-insulin-dependent diabetes mellitus.  5. Dyslipidemia.  6. Hypertension.      Lezlie Octave, N.P.    ______________________________  Madaline Savage, M.D.    BB/MEDQ  D:  03/26/2007  T:  03/26/2007  Job:  [id number]   cc:   Madelin Rear. Sherwood Gambler, MD

## 2011-01-27 NOTE — Discharge Summary (Signed)
NAMEATLEE, KLUTH NO.:  [id number]   MEDICAL RECORD NO.:  [id number]          PATIENT TYPE:  INP   LOCATION:  3743                         FACILITY:  [facility]   PHYSICIAN:  Madaline Savage, M.D.DATE OF BIRTH:  25-Oct-1950   DATE OF ADMISSION:  06/02/2005  DATE OF DISCHARGE:  06/04/2005                                 DISCHARGE SUMMARY   [name] is a 60 year old white male patient of Dr. Truett Perna, Dr.  Jacinto Halim and Dr. Sherwood Gambler who underwent cardiac catheterization in [facility] on June 02, 2005, revealing high grade stenosis in his  proximal circumflex, total RCA with an EF of 40 to 50%.  He was transferred  to [facility] for a PCI.  This was performed by Dr.  Nicki Guadalajara on June 02, 2005.  He underwent CYPHER stenting with a 3.5  x 23 in his circumflex.  He also had a cutting balloon to a distal portion  of his circumflex .  Post procedure, he did have some chest pain.  He was  continued on Integrilin and nitroglycerin.  His CK-MBs had a mild elevation  of 117/8.6 with troponin of 0.80.  As he was kept again overnight, the  morning of June 04, 2005, his troponin was 1.93.  He had had no further  chest pain.  His nitroglycerin, heparin and Integrilin had all been  discontinued.  He was up walking in the hall.  He was seen by cardiac rehab  and referred to phase II program.  It was decided  he was stable to be  discharged home.  His blood pressure was 113/81, heart rate 78, respirations  14.  We did titrate his medications for elevated blood pressure.   LABORATORY DATA:  Hemoglobin 17.1, hematocrit 50.5, wbc 9.8 and platelets  172.  Sodium 135, potassium 3.7, chloride 100, CO2 27, BUN 10, creatinine  1.1.  Glucose 227.  CK-MB starting June 02, 2005, show 70/2.5, troponin  of 0.13; #2 73/3.3; #3 117/8.6 with troponin of 0.80 on June 03, 2005,  at 6 a.m. June 04, 2005, at 3 a.m., 119/6.4 with  troponin of 1.93.   X-ray June 01, 2005, showed no active disease.   DISCHARGE MEDICATIONS:  1.  Ticlid 250 mg twice a day.  2.  Enteric coated aspirin 325 mg once a day.  3.  Metoprolol 25 mg twice a day.  4.  Imdur 30 mg once a day.  5.  Protonix 40 mg once a day.  6.  Lisinopril 20 mg once a day.  7.  Metformin 500 mg two tablets twice a day.  8.  Lexapro 10 mg once a day.  9.  Lipitor 10 mg once a day.  10. He is not to take his hydrochlorothiazide.   He will follow up with Dr. Elsie Lincoln in approximately one to two weeks.  He  already has an appointment.  He will stay out of work for one week.   DISCHARGE DIAGNOSES:  1.  Unstable angina.  2.  Status post catheterization at [facility]  with high grade      circumflex disease, treated with CYPHER stenting and a plain old balloon      angioplasty to his circumflex June 02, 2005, by Dr. Tresa Endo.      Original cath by Dr. Elsie Lincoln at [facility] center.  3.  Post procedure chest pain with minimally elevated troponins.  4.  Diabetes mellitus.  5.  Hypertension.  6.  Allergy to PLAVIX and DYE.  On Ticlid instead of Plavix and treated      prophylactically at cath for dye allergy.  7.  Ejection fraction of 40 to 50% at cardiac catheterization.      Lezlie Octave, N.P.    ______________________________  Madaline Savage, M.D.    BB/MEDQ  D:  06/04/2005  T:  06/05/2005  Job:  [id number]   cc:   Madelin Rear. Sherwood Gambler, MD  Fax: [phone]

## 2011-01-27 NOTE — Cardiovascular Report (Signed)
NAMECIPRIANO, Robert [name] NO.:  [id number]   MEDICAL RECORD NO.:  [id number]          PATIENT TYPE:  INP   LOCATION:  6531                         FACILITY:  [facility]   PHYSICIAN:  Robert [name], M.D.DATE OF BIRTH:  Apr 23, 1951   DATE OF PROCEDURE:  05/09/2006  DATE OF DISCHARGE:                              CARDIAC CATHETERIZATION   PROCEDURES PERFORMED:  1. Percutaneous balloon angioplasty of the distal circumflex coronary      artery.  2. Stenting of distal right coronary artery.  3. Stand-alone balloon angioplasty of the ostium of obtuse marginal branch      #2 of left circumflex coronary artery.   COMPLICATIONS:  None.   ENTRY SITE:  Right femoral.   DYE USED:  Omnipaque.   CATHETERS USED:  A 7-French sheath and 7-French Judkins catheters.  Guide  catheters used but not satisfactory included a 7-French 3.5 Voda Cordis  guide catheter, a left Amplatz #1 Cordis catheter.  The successful catheter  used in the case was a FL-4 Judkins guide by Cordis.   MEDICATIONS GIVEN:  Versed and fentanyl, heparin and Integrilin.   PATIENT PROFILE:  Robert [name] is a 60 year old white gentleman who is  followed in [place] by Robert [name] and by Dr. Madaline [name] at  [facility] in [place] as well.  He recently  had episodes of chest pain dating back to early August of 2007 and had had a  previous multilesion stenting of the circumflex proximally and then cutting  balloon atherectomy of a bifurcation stenosis in the mid AV groove branch of  the obtuse marginal within the last month of August.  He developed more pain  and a Persantine-Myoview showed an ejection fraction of 52% and inferior  lateral wall ischemia with a fixed inferior wall defect.  He underwent  cardiac cath at the [facility] and was shown to have a tight  stenosis in what I would call the distal circumflex.  It was beyond obtuse  marginal branch #1 and  just before obtuse marginal branch #2 well down the  atrioventricular groove portion of the cardiac anatomy.  There was perhaps  some involvement in the ostium of second OM but the 99% lesion was a very  focal and abrupt lesion about 8 mm in length in the distal circumflex.   The patient has an a Plavix allergy but he is on Ticlid 250 mg twice a day  and that was continued over the last weeks before today's procedure.  Today's procedure was performed electively as an outpatient without  complications.   RESULTS:  Angiography of the circumflex coronary artery was attempted with  two guide catheters prior to getting a Judkins 4 that intubated the separate  ostia to the circumflex adequately.  My working view was a 5 degrees RAO  projection with 1 degree of caudal angulation.  I witnessed a patent  overlapping stent in the proximal and mid-circumflex that extended from the  very proximal portion of the vessel down to just before the origin of obtuse  marginal branch #1.  The lesion in question that is distal just above OM #1,  #2 and #3.  I placed guidewires in both vessels after confirming that my ACT  was greater than 200.  I then balloon angioplastied the ongoing lumen of the  circumflex keeping the balloon out of obtuse marginal branch #2.  A balloon  angioplasty was performed twice and got an excellent result at about 6-8  atmospheres of balloon inflation pressure.  I next put in a Cypher stent 3.0  x 18 and strategically placed it and deployed that stent with two inflations  to between 40 and 60 seconds for approximately 14 atmospheres of inflation  pressure.  The result looked excellent with 99% stenosis reduced to 0%  residual, but there was haziness inside the ostium of obtuse marginal branch  #1 which previously had looked fairly pristine.   I thereafter took on the very labor-intensive task of then pulling back the  wire from the successful stenting of the ongoing OM and placing  it into the  central lumen and then out the OM #2 again this time through the recently  placed stent.  This was accomplished but with a lot of work.  There was  hangup of balloons and of guidewires just above obtuse marginal branch #1.  I suspect there was a strut from the previously placed stent from months and  years ago that gave a lot of resistance.  Despite the above-stated technical  problems, I was able to wire the OM #2 and then place a fresh Maverick 2.5 x  12 mm balloon through the stent strut and I was able to open the ostium of  the obtuse marginal branch #2 with two balloon inflations to approximately  14 atmospheres of pressure on a Maverick balloon. The result afterwards  looked pristine.  There was resolution of the 50% ostial jailing of OM #2 to  0% residual and the original stent placed in circumflex and blown up to 14  atmospheres also remained pristine.  There was TIMI III flow before any  interventional work was done and the result remained a III thereafter.  The  patient was removed the cath table and taken to the cath lab.  We will  continue Integrilin for 18 hours.  We plan an external patch for hemostasis.   FINAL DIAGNOSIS:  1. Successful two site percutaneous intervention. A percutaneous balloon      angioplasty followed by stenting of these distal circumflex coronary      artery just above obtuse marginal branch #2 and distal to that takeoff      point.  2. Successful balloon angioplasty of jailing of the ostium of obtuse      marginal #2 with balloon angioplasty only.           ______________________________  Robert [name], M.D.     WHG/MEDQ  D:  05/09/2006  T:  05/09/2006  Job:  [id number]   cc:   Robert Rear. Sherwood Gambler, MD  Robert [name] Medical Records

## 2011-01-27 NOTE — Cardiovascular Report (Signed)
NAMEGOEBEL, HELLUMS NO.:  [id number]   MEDICAL RECORD NO.:  [id number]          PATIENT TYPE:  OIB   LOCATION:  2899                         FACILITY:  [facility]   PHYSICIAN:  Cristy Hilts. Jacinto Halim, MD       DATE OF BIRTH:  May 25, 1951   DATE OF PROCEDURE:  04/10/2005  DATE OF DISCHARGE:                              CARDIAC CATHETERIZATION   REFERRING PHYSICIAN:  Elfredia Nevins, M.D.   PROCEDURES PERFORMED:  1.  Lower abdominal aortogram.  2.  Bilateral femoral angiography with femoral runoff.  3.  Attempted PTA of the left SFA.   INDICATIONS:  Mr. 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  Has been having  increasing lifestyle-limiting claudication.  He also had normal lower  extremity Dopplers.  Given this he was brought to the catheterization suite  to evaluate his peripheral anatomy for possible revascularization.   ANGIOGRAPHIC DATA:  Lower abdominal aortogram revealed aortoiliac  bifurcation to be widely patent.  The iliacs were widely patent bilaterally.   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  The SFA  continued smoothly with minimal luminal irregularity until the distal end  where it is occluded.  There is two large collaterals that comes off one  proximal to the occlusion and one at the site of occlusion.  They  reconstitute the leg and there is three vessel runoff noted in the left  lower extremity.   Right femoral angiogram with femoral runoff revealed minimal luminal  irregularity of the right superficial femoral artery.  The popliteal was  widely patent with three vessel runoff again noted in the right lower  extremity.   INTERVENTIONAL DATA:  Unsuccessful attempt at crossing into the occluded  left SFA.  A 0.035 inch Glidewire was utilized both from the angled end and  also the straight end and also a Cross-It 300 coronary wire was also  attempted to be utilized with the help of a catheter back-up but I was  unable to cross into the occluded segment.  Because of this and because the  risk of jeopardizing a large collateral that comes off right at the  occlusion site it was decided to abandon the procedure.   RECOMMENDATIONS:  Patient will be controlled on medical therapy.  I have  discussed with him regarding surgical revascularization.  Patient wants to  think about this.  He would be an ideal candidate for surgical  revascularization with excellent three vessel runoff.  Would expect long-  term good patency.   TECHNIQUE OF PROCEDURE:  Under the usual sterile precautions using a 5-  French right femoral artery access, 5-French crossover catheter was utilized  and a lower abdominal aortogram was performed.  Then the catheter was  utilized to engage the left iliac artery and a Glidewire was  used to cross  into the left iliac artery and angiography was repeated using an end hole  catheter.  Then angiography was repeated.  Then exchanging the end hole  catheter or a 0.035 [place] Wholey wire the right femoral sheath was  exchanged to a 7-French Terumo sheath and crossed over into the left iliac  artery.  Then after giving a total of 8000 units of IV heparin an attempt at  angioplasty was performed with the help of a Glidewire and also with the  help of a Cross-It wire.  Then the procedure was abandoned.  Then the end  hole catheter was withdrawn out of the body and right femoral angiography  was performed through the arterial access sheath.  The femoral follow  through was also performed through the arterial access sheath injection.  Then the sheath was pulled out of the body and exchanged for a short  Sanford  sheath and the sheath was sutured in place.  Patient tolerated the  procedure.  No immediate complication noted.  A total of 100 mL of contrast  was utilized for diagnostic and attempted interventional procedure.       JRG/MEDQ  D:  04/10/2005  T:  04/10/2005  Job:  [id number]

## 2011-01-27 NOTE — Cardiovascular Report (Signed)
NAMESADARIUS, Robert [name] NO.:  [id number]   MEDICAL RECORD NO.:  [id number]          PATIENT TYPE:  INP   LOCATION:  3743                         FACILITY:  [facility]   PHYSICIAN:  Nicki Guadalajara, M.D.     DATE OF BIRTH:  Aug 23, 1951   DATE OF PROCEDURE:  06/02/2005  DATE OF DISCHARGE:                              CARDIAC CATHETERIZATION   PROCEDURE:  Percutaneous coronary intervention.   INDICATIONS:  [name] is a 60 year old gentleman who has a history  of diabetes mellitus, hypertension, hyperlipidemia as well as known coronary  artery disease. He had suffered a myocardial infarction in 1995. He is  status post stenting of his left circumflex coronary artery in June 2004 by  Dr. Jacinto Halim which utilized to 4.0 x 18 mm BX velocity heparin-coated stent.  The patient continues to smoke cigarettes. He recently has been seen by Dr.  Elsie Lincoln where he was found to have increasing episodes of chest pain. Earlier  this morning, he underwent diagnostic catheterization at the [facility] by Dr. Elsie Lincoln. Please refer Dr. Truett Perna complete cardiac  catheterization report from today, June 02, 2005.   Catheterization revealed a widely patent previously placed stent but he now  had diffuse disease of 80-90% stenosis proximal to the stented segment and  also a 95-99% stenosis just at the bifurcation of the mid circumflex OM2  vessel. He also had collateralization of his distal RCA via a small AV  groove branch which arose just proximal to the stented segment. In the  distal aspect of the proximal lesion. Due to his high-grade stenoses, he was  ultimately transferred to [facility] for coronary intervention  today to be done by me.   PROCEDURE:  After premedication with Versed initially 2 milligrams  intravenously, the patient was prepped and draped in usual fashion. He was  still nervous and an additional 2 milligrams of Versed was administered. The  right femoral artery was punctured anteriorly and a 6-French sheath was  inserted. Initially, a Voda 3.5 guide was used but this seemed to only  selectively engage the LAD system. This was then changed to a Voda 4.0, FL-  4, but ultimately an Amplatz left guide was necessary for the interventional  procedure. The patient did receive double bolus Integrilin as well as weight-  adjusted heparinization. Due to Plavix allergy, he was also given Ticlid  prior to the procedure by Dr. Elsie Lincoln. ACTs were recorded during the  procedure to verify therapeutic anticoagulation. He received an additional  2000 units of heparin after the initial 5000 units. Initially, a Luge wire  was advanced down the circumflex into the OM2 vessel beyond the bifurcation  stenosis. Due to the diffuse proximal disease almost commencing shortly  after the ostium of the circumflex, it was felt that a 3.5 x 23 mm drug-  eluting Cypher stent would be best utilized to cover this entire proximal  segment and be placed in tandem to the previously placed BX velocity stent.  The 3.5 Cypher stent was dilated to 16 atmospheres. Poststent dilatation was  done utilizing  a 4.0 x 20 mm Quantum balloon with dilatation up to 4.0 mm.  With documentation of excellent angiographic result in the diffusely  diseased proximal segment, attention was then directed at the bifurcation  stenosis involving the mid AV groove circumflex and OM2 take off. An  additional wire, a Prowire, was then advanced down the AV groove circumflex  such that both large vessels were protected. Due to the bifurcation type  stenosis two large vessels, it was felt that this would not be ideally  stentable due to jailing a large vessel. A 3.5 x 6 mm cutting balloon  arthrotomy catheter was then inserted. Numerous cuts were made with  dilatation up to 8 atmospheres ultimately. Scout angiography confirmed an  excellent angiographic result. There was TIMI III flow. There was  no  evidence for dissection.   HEMODYNAMIC DATA:  Central aortic pressure was 103/74, mean 88. The patient  received several doses of 200 mcg of intracoronary nitroglycerin down the  circumflex system.   ANGIOGRAPHIC DATA:  The left circumflex vessel immediately arose from almost  a common ostium left main. The circumflex had diffuse disease of 80% in its  proximal segment extending to the previously placed stent with narrowing of  40% in the proximal portion of this previously placed BX velocity stent.  Following successful primary stenting with 3.5 x 23 mm Cypher balloon which  was postdilated to 4.0 mm size, the entire proximal circumflex was reduced  to 0%. There was TIMI III flow and no evidence for dissection. The mid  circumflex at the bifurcation of the OM2 vessel had a 95-99% stenosis.  Following successful cutting balloon arthrotomy, this was reduced to  approximately 0% again without evidence for dissection and with TIMI III  flow and without any luminal encroachment in the obtuse marginal vessel.  There also was no change in the brisk collaterals from the AV groove  circumflex to the distal RCA.   IMPRESSION:  Successful multilesion percutaneous coronary intervention  involving a long disease segment in the proximal circumflex of 80% being  reduced to 0% with a 3.5 x 23 mm Cypher stent postdilated to 4.0 mm, and  successful cutting balloon arthrotomy of 95-99% bifurcation stenosis in the  mid atrial ventricle groove circumflex/obtuse marginal-2 origin with the  percent stenosis being reduced to 0% done with double bolus  Integrilin/weight-adjusted heparinization and oral Ticlid for antiplatelet  therapy.           ______________________________  Nicki Guadalajara, M.D.     TK/MEDQ  D:  06/02/2005  T:  06/03/2005  Job:  [id number]   cc:   [facility]   Madelin Rear. Sherwood Gambler, MD  Fax: [phone]   Madaline Savage, M.D.  Fax: [phone]  [facility]

## 2011-06-28 LAB — CBC
HCT: 47.4
Hemoglobin: 16.3
MCHC: 34.3
MCV: 87.7
Platelets: 179
RBC: 5.41
RDW: 14.1 — ABNORMAL HIGH
WBC: 8.5

## 2011-06-28 LAB — BASIC METABOLIC PANEL WITH GFR
BUN: 9
CO2: 24
Calcium: 9.5
Chloride: 104
Creatinine, Ser: 1.08
GFR calc non Af Amer: >60
Glucose, Bld: 281 — ABNORMAL HIGH
Potassium: 3.9
Sodium: 135

## 2011-06-28 LAB — PROTIME-INR
INR: 1
Prothrombin Time: 13.5

## 2011-06-28 LAB — APTT: aPTT: 27

## 2011-06-29 LAB — CBC
HCT: 48.7
Hemoglobin: 16.5
MCV: 89.6
RBC: 5.42
RBC: 5.43
WBC: 10
WBC: 13.7 — ABNORMAL HIGH

## 2011-06-29 LAB — COMPREHENSIVE METABOLIC PANEL
AST: 40 — ABNORMAL HIGH
BUN: 13
CO2: 24
Chloride: 101
Creatinine, Ser: 0.92
GFR calc Af Amer: >60
GFR calc non Af Amer: >60
Glucose, Bld: 168 — ABNORMAL HIGH
Total Bilirubin: 0.6

## 2011-06-29 LAB — LIPID PANEL
HDL: 30 — ABNORMAL LOW
Triglycerides: 149
Triglycerides: 202 — ABNORMAL HIGH
VLDL: 30

## 2011-06-29 LAB — BASIC METABOLIC PANEL
Calcium: 8.9
Creatinine, Ser: 0.94
GFR calc Af Amer: >60
GFR calc non Af Amer: >60
Sodium: 137

## 2011-08-27 ENCOUNTER — Encounter: Payer: Self-pay | Admitting: Emergency Medicine

## 2011-08-27 ENCOUNTER — Emergency Department (HOSPITAL_COMMUNITY)
Admission: EM | Admit: 2011-08-27 | Discharge: 2011-08-27 | Disposition: A | Discharge status: Home / Self Care | Payer: Medicare Other | Attending: Emergency Medicine | Admitting: Emergency Medicine

## 2011-08-27 DIAGNOSIS — Z79899 Other long term (current) drug therapy: Secondary | ICD-10-CM | POA: Insufficient documentation

## 2011-08-27 DIAGNOSIS — M069 Rheumatoid arthritis, unspecified: Secondary | ICD-10-CM | POA: Insufficient documentation

## 2011-08-27 DIAGNOSIS — I10 Essential (primary) hypertension: Secondary | ICD-10-CM | POA: Insufficient documentation

## 2011-08-27 DIAGNOSIS — F17200 Nicotine dependence, unspecified, uncomplicated: Secondary | ICD-10-CM | POA: Insufficient documentation

## 2011-08-27 DIAGNOSIS — E119 Type 2 diabetes mellitus without complications: Secondary | ICD-10-CM | POA: Insufficient documentation

## 2011-08-27 DIAGNOSIS — M25549 Pain in joints of unspecified hand: Secondary | ICD-10-CM | POA: Insufficient documentation

## 2011-08-27 DIAGNOSIS — I252 Old myocardial infarction: Secondary | ICD-10-CM | POA: Insufficient documentation

## 2011-08-27 DIAGNOSIS — M25519 Pain in unspecified shoulder: Secondary | ICD-10-CM | POA: Insufficient documentation

## 2011-08-27 DIAGNOSIS — R6884 Jaw pain: Secondary | ICD-10-CM | POA: Insufficient documentation

## 2011-08-27 DIAGNOSIS — M542 Cervicalgia: Secondary | ICD-10-CM | POA: Insufficient documentation

## 2011-08-27 HISTORY — DX: Rheumatoid arthritis, unspecified: M06.9

## 2011-08-27 HISTORY — DX: Essential (primary) hypertension: I10

## 2011-08-27 MED ORDER — HYDROMORPHONE HCL PF 1 MG/ML IJ SOLN
2.0000 mg | Freq: Once | INTRAMUSCULAR | Status: AC
  Administered 2011-08-27: 2 mg via INTRAMUSCULAR
  Filled 2011-08-27: qty 2

## 2011-08-27 MED ORDER — ONDANSETRON 8 MG PO TBDP
8.0000 mg | ORAL_TABLET | Freq: Once | ORAL | Status: AC
  Administered 2011-08-27: 8 mg via ORAL
  Filled 2011-08-27: qty 1

## 2011-08-27 MED ORDER — OXYCODONE-ACETAMINOPHEN 5-325 MG PO TABS: 1.0000 | ORAL_TABLET | ORAL | Status: AC | PRN

## 2011-08-27 NOTE — ED Notes (Signed)
Pt a/ox4. Resp even and unlabored. NAD at this time. D/C instructions reviewed with pt. Pt verbalized understanding. Pt to POV via w/c. Son to transport home.

## 2011-08-27 NOTE — ED Provider Notes (Signed)
Medical screening examination/treatment/procedure(s) were performed by non-physician practitioner and as supervising physician I was immediately available for consultation/collaboration.  Donnetta Hutching, MD 08/27/11 2051

## 2011-08-27 NOTE — ED Provider Notes (Signed)
History     CSN: [id number] Arrival date & time: 08/27/2011  4:28 PM   First MD Initiated Contact with Patient 08/27/11 1636      Chief Complaint  Patient presents with  . Joint Pain    (Consider location/radiation/quality/duration/timing/severity/associated sxs/prior treatment) HPI Comments: Patient presents with increasing arthralgias related to his rheumatoid arthritis.  He had run out of his methotrexate but is now back on it, unfortunately not before developing a flair of his arthritis pain.  He just started a prednisone taper this week as well, but so far has not improved his symptoms  He presents for pain control.  He denies fevers and myalgias,  Denies any swelling of joints.  His jaw, left mcp joint of his thumb and his neck and shoulders are the most painful joints today.  He denies injury.  He has not tried any other pain relievers.  The history is provided by the patient.    Past Medical History  Diagnosis Date  . Rheumatoid arthritis   . MI (myocardial infarction)   . Diabetes mellitus   . Hypertension     Past Surgical History  Procedure Date  . Coronary stent placement   . Back surgery   . Cholecystectomy   . Abdominal surgery   . Cardiac surgery     No family history on file.  History  Substance Use Topics  . Smoking status: Current Everyday Smoker -- 1.0 packs/day    Types: Cigarettes  . Smokeless tobacco: Not on file  . Alcohol Use: No      Review of Systems  Constitutional: Negative for fever.  HENT: Negative for congestion, sore throat and neck pain.   Eyes: Negative.   Respiratory: Negative for chest tightness and shortness of breath.   Cardiovascular: Negative for chest pain.  Gastrointestinal: Negative for nausea and abdominal pain.  Genitourinary: Negative.   Musculoskeletal: Positive for arthralgias. Negative for myalgias, back pain and joint swelling.  Skin: Negative.  Negative for rash and wound.  Neurological: Negative for  dizziness, weakness, light-headedness, numbness and headaches.  Hematological: Negative.   Psychiatric/Behavioral: Negative.     Allergies  Ivp dye and Plavix  Home Medications   Current Outpatient Rx  Name Route Sig Dispense Refill  . OXYCODONE-ACETAMINOPHEN 5-325 MG PO TABS Oral Take 1 tablet by mouth every 4 (four) hours as needed for pain. 20 tablet 0    BP 166/68  Pulse 51  Temp(Src) 98.8 F (37.1 C) (Oral)  Resp 17  Ht 6' (1.829 m)  Wt 225 lb (102.059 kg)  BMI 30.52 kg/m2  SpO2 99%  Physical Exam  Nursing note and vitals reviewed. Constitutional: He is oriented to person, place, and time. He appears well-developed and well-nourished.  HENT:  Head: Normocephalic and atraumatic.  Eyes: Conjunctivae are normal.  Neck: Normal range of motion.  Cardiovascular: Normal rate, regular rhythm, normal heart sounds and intact distal pulses.   Pulmonary/Chest: Effort normal and breath sounds normal. He has no wheezes.  Abdominal: Soft. Bowel sounds are normal. There is no tenderness.  Musculoskeletal: Normal range of motion.       Generalized ttp neck,  Bilateral shoulders and left hand with no significant edema,  No erythema or effusion noted.  Neurological: He is alert and oriented to person, place, and time.  Skin: Skin is warm and dry.  Psychiatric: He has a normal mood and affect.    ED Course  Procedures (including critical care time)  Labs Reviewed - No data  to display No results found.   1. Rheumatoid arthritis       MDM  Dilaudid 2 mg IM given with moderate relief of pain.  Oxycodone prescribed.  Encourage to f/u with Dr Dierdre Forth if sx do not continue to improve.        Candis Musa, PA 08/27/11 1751

## 2011-08-27 NOTE — ED Notes (Signed)
Pt presents with generalized joint pain. Pt has Hx of RA and is on Prednisone taper dose but medication is not helping. NAD at this time. Pt has full ROM of all joints.

## 2011-08-27 NOTE — ED Notes (Signed)
Patient c/o joint pain for approximately one week. History of RA. States Dr Dierdre Forth placed him on prednisone 4 days ago. States pain is not improving.

## 2011-12-21 ENCOUNTER — Ambulatory Visit (HOSPITAL_COMMUNITY)
Admission: EM | Admit: 2011-12-21 | Discharge: 2011-12-23 | Disposition: A | Discharge status: Home / Self Care | Payer: Medicare Other | Attending: Cardiology | Admitting: Cardiology

## 2011-12-21 ENCOUNTER — Emergency Department (HOSPITAL_COMMUNITY): Payer: Medicare Other

## 2011-12-21 ENCOUNTER — Encounter (HOSPITAL_COMMUNITY): Payer: Self-pay | Admitting: *Deleted

## 2011-12-21 DIAGNOSIS — Z9861 Coronary angioplasty status: Secondary | ICD-10-CM | POA: Insufficient documentation

## 2011-12-21 DIAGNOSIS — E785 Hyperlipidemia, unspecified: Secondary | ICD-10-CM | POA: Insufficient documentation

## 2011-12-21 DIAGNOSIS — Y831 Surgical operation with implant of artificial internal device as the cause of abnormal reaction of the patient, or of later complication, without mention of misadventure at the time of the procedure: Secondary | ICD-10-CM | POA: Insufficient documentation

## 2011-12-21 DIAGNOSIS — Z720 Tobacco use: Secondary | ICD-10-CM | POA: Diagnosis present

## 2011-12-21 DIAGNOSIS — R0602 Shortness of breath: Secondary | ICD-10-CM | POA: Insufficient documentation

## 2011-12-21 DIAGNOSIS — Z91041 Radiographic dye allergy status: Secondary | ICD-10-CM | POA: Diagnosis present

## 2011-12-21 DIAGNOSIS — E119 Type 2 diabetes mellitus without complications: Secondary | ICD-10-CM | POA: Insufficient documentation

## 2011-12-21 DIAGNOSIS — I10 Essential (primary) hypertension: Secondary | ICD-10-CM | POA: Insufficient documentation

## 2011-12-21 DIAGNOSIS — I739 Peripheral vascular disease, unspecified: Secondary | ICD-10-CM | POA: Diagnosis present

## 2011-12-21 DIAGNOSIS — I200 Unstable angina: Secondary | ICD-10-CM | POA: Insufficient documentation

## 2011-12-21 DIAGNOSIS — I252 Old myocardial infarction: Secondary | ICD-10-CM | POA: Insufficient documentation

## 2011-12-21 DIAGNOSIS — T82897A Other specified complication of cardiac prosthetic devices, implants and grafts, initial encounter: Secondary | ICD-10-CM | POA: Insufficient documentation

## 2011-12-21 DIAGNOSIS — I2510 Atherosclerotic heart disease of native coronary artery without angina pectoris: Secondary | ICD-10-CM | POA: Insufficient documentation

## 2011-12-21 DIAGNOSIS — F17200 Nicotine dependence, unspecified, uncomplicated: Secondary | ICD-10-CM | POA: Insufficient documentation

## 2011-12-21 DIAGNOSIS — M069 Rheumatoid arthritis, unspecified: Secondary | ICD-10-CM | POA: Insufficient documentation

## 2011-12-21 DIAGNOSIS — R079 Chest pain, unspecified: Secondary | ICD-10-CM | POA: Insufficient documentation

## 2011-12-21 DIAGNOSIS — I2582 Chronic total occlusion of coronary artery: Secondary | ICD-10-CM | POA: Insufficient documentation

## 2011-12-21 HISTORY — DX: Heart failure, unspecified: I50.9

## 2011-12-21 HISTORY — DX: Atherosclerotic heart disease of native coronary artery without angina pectoris: I25.10

## 2011-12-21 LAB — COMPREHENSIVE METABOLIC PANEL
ALT: 20 U/L (ref 0–53)
BUN: 14 mg/dL (ref 6–23)
Calcium: 10.3 mg/dL (ref 8.4–10.5)
Creatinine, Ser: 0.9 mg/dL (ref 0.50–1.35)
GFR calc Af Amer: >90 mL/min (ref >90)
Glucose, Bld: 120 mg/dL — ABNORMAL HIGH (ref 70–99)
Sodium: 135 mEq/L (ref 135–145)
Total Protein: 8 g/dL (ref 6.0–8.3)

## 2011-12-21 LAB — CBC
HCT: 47.5 % (ref 39.0–52.0)
Hemoglobin: 17 g/dL (ref 13.0–17.0)
MCH: 31.4 pg (ref 26.0–34.0)
MCHC: 34.9 g/dL (ref 30.0–36.0)
MCV: 89 fL (ref 78.0–100.0)
MCV: 89.9 fL (ref 78.0–100.0)
RBC: 5.34 MIL/uL (ref 4.22–5.81)
RDW: 16.3 % — ABNORMAL HIGH (ref 11.5–15.5)
WBC: 10.7 10*3/uL — ABNORMAL HIGH (ref 4.0–10.5)

## 2011-12-21 LAB — URINALYSIS, ROUTINE W REFLEX MICROSCOPIC
Bilirubin Urine: NEGATIVE
Ketones, ur: NEGATIVE mg/dL
Leukocytes, UA: NEGATIVE
Nitrite: NEGATIVE
Protein, ur: NEGATIVE mg/dL
Urobilinogen, UA: 0.2 mg/dL (ref 0.0–1.0)

## 2011-12-21 LAB — APTT: aPTT: 27 seconds (ref 24–37)

## 2011-12-21 LAB — PROTIME-INR
INR: 0.94 (ref 0.00–1.49)
Prothrombin Time: 12.8 seconds (ref 11.6–15.2)

## 2011-12-21 MED ORDER — GLIPIZIDE ER 10 MG PO TB24
10.0000 mg | ORAL_TABLET | Freq: Two times a day (BID) | ORAL | Status: DC
  Administered 2011-12-22 – 2011-12-23 (×2): 10 mg via ORAL
  Filled 2011-12-21 (×5): qty 1

## 2011-12-21 MED ORDER — HYDROXYCHLOROQUINE SULFATE 200 MG PO TABS
200.0000 mg | ORAL_TABLET | Freq: Two times a day (BID) | ORAL | Status: DC
  Administered 2011-12-22 – 2011-12-23 (×3): 200 mg via ORAL
  Filled 2011-12-21 (×6): qty 1

## 2011-12-21 MED ORDER — ONDANSETRON HCL 4 MG/2ML IJ SOLN: 4.0000 mg | Freq: Four times a day (QID) | INTRAMUSCULAR | Status: DC | PRN

## 2011-12-21 MED ORDER — ACETAMINOPHEN 325 MG PO TABS: 650.0000 mg | ORAL_TABLET | Freq: Four times a day (QID) | ORAL | Status: DC | PRN

## 2011-12-21 MED ORDER — METOPROLOL TARTRATE 1 MG/ML IV SOLN
5.0000 mg | Freq: Once | INTRAVENOUS | Status: AC
  Administered 2011-12-21: 5 mg via INTRAVENOUS
  Filled 2011-12-21: qty 5

## 2011-12-21 MED ORDER — METHOTREXATE 2.5 MG PO TABS: 25.0000 mg | ORAL_TABLET | ORAL | Status: DC

## 2011-12-21 MED ORDER — METOPROLOL TARTRATE 50 MG PO TABS
50.0000 mg | ORAL_TABLET | Freq: Two times a day (BID) | ORAL | Status: DC
  Administered 2011-12-22 – 2011-12-23 (×3): 50 mg via ORAL
  Filled 2011-12-21 (×6): qty 1

## 2011-12-21 MED ORDER — QUINAPRIL HCL 10 MG PO TABS
20.0000 mg | ORAL_TABLET | Freq: Every day | ORAL | Status: DC
  Administered 2011-12-22: 20 mg via ORAL
  Filled 2011-12-21 (×3): qty 2

## 2011-12-21 MED ORDER — FOLIC ACID 1 MG PO TABS
1.0000 mg | ORAL_TABLET | Freq: Every day | ORAL | Status: DC
  Administered 2011-12-22: 1 mg via ORAL
  Filled 2011-12-21 (×3): qty 1

## 2011-12-21 MED ORDER — NITROGLYCERIN 0.4 MG SL SUBL
0.4000 mg | SUBLINGUAL_TABLET | SUBLINGUAL | Status: DC | PRN
  Administered 2011-12-21: 0.4 mg via SUBLINGUAL
  Filled 2011-12-21: qty 25

## 2011-12-21 MED ORDER — ASPIRIN EC 325 MG PO TBEC: 325.0000 mg | DELAYED_RELEASE_TABLET | Freq: Every day | ORAL | Status: DC

## 2011-12-21 MED ORDER — ONDANSETRON HCL 4 MG PO TABS: 4.0000 mg | ORAL_TABLET | Freq: Four times a day (QID) | ORAL | Status: DC | PRN

## 2011-12-21 MED ORDER — CILOSTAZOL 100 MG PO TABS
100.0000 mg | ORAL_TABLET | Freq: Two times a day (BID) | ORAL | Status: DC
  Administered 2011-12-22 – 2011-12-23 (×3): 100 mg via ORAL
  Filled 2011-12-21 (×6): qty 1

## 2011-12-21 MED ORDER — HEPARIN BOLUS VIA INFUSION
4000.0000 [IU] | Freq: Once | INTRAVENOUS | Status: AC
  Administered 2011-12-21: 4000 [IU] via INTRAVENOUS

## 2011-12-21 MED ORDER — INSULIN ASPART 100 UNIT/ML ~~LOC~~ SOLN
0.0000 [IU] | Freq: Three times a day (TID) | SUBCUTANEOUS | Status: DC
  Administered 2011-12-22: 3 [IU] via SUBCUTANEOUS

## 2011-12-21 MED ORDER — HEPARIN (PORCINE) IN NACL 100-0.45 UNIT/ML-% IJ SOLN
1550.0000 [IU]/h | INTRAMUSCULAR | Status: DC
  Administered 2011-12-21 – 2011-12-22 (×2): 1300 [IU]/h via INTRAVENOUS
  Administered 2011-12-22: 1550 [IU]/h via INTRAVENOUS
  Filled 2011-12-21 (×4): qty 250

## 2011-12-21 MED ORDER — ACETAMINOPHEN 650 MG RE SUPP: 650.0000 mg | Freq: Four times a day (QID) | RECTAL | Status: DC | PRN

## 2011-12-21 MED ORDER — SODIUM CHLORIDE 0.9 % IV SOLN
20.0000 mL | INTRAVENOUS | Status: DC
  Administered 2011-12-21: 20 mL via INTRAVENOUS

## 2011-12-21 MED ORDER — ENOXAPARIN SODIUM 40 MG/0.4ML ~~LOC~~ SOLN
40.0000 mg | SUBCUTANEOUS | Status: DC
  Filled 2011-12-21: qty 0.4

## 2011-12-21 MED ORDER — INSULIN GLARGINE 100 UNIT/ML ~~LOC~~ SOLN
20.0000 [IU] | Freq: Every day | SUBCUTANEOUS | Status: DC
  Administered 2011-12-22: 30 [IU] via SUBCUTANEOUS

## 2011-12-21 MED ORDER — MORPHINE SULFATE 2 MG/ML IJ SOLN
1.0000 mg | INTRAMUSCULAR | Status: DC | PRN
  Administered 2011-12-22: 1 mg via INTRAVENOUS
  Filled 2011-12-21: qty 1

## 2011-12-21 MED ORDER — SODIUM CHLORIDE 0.9 % IJ SOLN
3.0000 mL | Freq: Two times a day (BID) | INTRAMUSCULAR | Status: DC
  Administered 2011-12-22: 3 mL via INTRAVENOUS

## 2011-12-21 MED ORDER — NITROGLYCERIN 0.4 MG SL SUBL: 0.4000 mg | SUBLINGUAL_TABLET | SUBLINGUAL | Status: DC | PRN

## 2011-12-21 MED ORDER — ASPIRIN 81 MG PO CHEW
324.0000 mg | CHEWABLE_TABLET | Freq: Once | ORAL | Status: AC
  Administered 2011-12-21: 324 mg via ORAL
  Filled 2011-12-21: qty 4

## 2011-12-21 MED ORDER — SODIUM CHLORIDE 0.45 % IV SOLN
INTRAVENOUS | Status: DC
  Administered 2011-12-22: 06:00:00 via INTRAVENOUS

## 2011-12-21 MED ORDER — MELOXICAM 15 MG PO TABS
15.0000 mg | ORAL_TABLET | Freq: Every day | ORAL | Status: DC
  Administered 2011-12-22: 15 mg via ORAL
  Filled 2011-12-21 (×3): qty 1

## 2011-12-21 NOTE — H&P (Signed)
Robert [name] is an 61 y.o. male.   Chief Complaint: Chest pain times one day HPI: A 61 year old gentleman man with history of coronary artery disease status post previous stent placement by [facility] who was transferred from any pain hospital secondary to chest pain today. Patient has been doing fine when the chest pain came suddenly centrally located radiating to his left arm consistent with his previous type of chest pain when he had his MI. It lasted about 5 minutes was relieved by nitroglycerin x1. Associated with some diaphoresis denied nausea or vomiting. The pain was about 6/10. He has some associated shortness of breath with it but mild cough no fever no chills. Dr. [name] was notified and asked that the patient should transferred to [facility] for father workup. His initial enzymes and EKG were negative for anything acute. He is now chest pain-free since the nitroglycerin.  Past Medical History  Diagnosis Date  . Rheumatoid arthritis   . MI (myocardial infarction)   . Diabetes mellitus   . Hypertension     Past Surgical History  Procedure Date  . Coronary stent placement   . Back surgery   . Cholecystectomy   . Abdominal surgery   . Cardiac surgery     History reviewed. No pertinent family history. Social History:  reports that he has been smoking Cigarettes.  He has been smoking about 1 pack per day. He does not have any smokeless tobacco history on file. He reports that he does not drink alcohol or use illicit drugs.  Allergies:  Allergies  Allergen Reactions  . Ivp Dye (Iodinated Diagnostic Agents) Hives and Itching  . Plavix (Clopidogrel Bisulfate) Rash    Medications Prior to Admission  Medication Dose Route Frequency Provider Last Rate Last Dose  . 0.9 %  sodium chloride infusion  20 mL Intravenous Continuous Hilario Quarry, MD 20 mL/hr at 12/21/11 1406 20 mL at 12/21/11 1406  . aspirin chewable tablet 324 mg  324 mg Oral Once Hilario Quarry, MD   324 mg at 12/21/11 1406  . heparin ADULT infusion 100 units/mL (25000 units/250 mL)  1,300 Units/hr Intravenous Continuous Hilario Quarry, MD 13 mL/hr at 12/21/11 1542 1,300 Units/hr at 12/21/11 1542  . heparin bolus via infusion 4,000 Units  4,000 Units Intravenous Once Hilario Quarry, MD   4,000 Units at 12/21/11 1542  . insulin aspart (novoLOG) injection 0-9 Units  0-9 Units Subcutaneous TID WC Rometta Emery, MD      . metoprolol (LOPRESSOR) injection 5 mg  5 mg Intravenous Once Hilario Quarry, MD   5 mg at 12/21/11 1908  . nitroGLYCERIN (NITROSTAT) SL tablet 0.4 mg  0.4 mg Sublingual Q5 min PRN Hilario Quarry, MD   0.4 mg at 12/21/11 1407   Medications Prior to Admission  Medication Sig Dispense Refill  . cilostazol (PLETAL) 100 MG tablet Take 100 mg by mouth 2 (two) times daily.        . folic acid (FOLVITE) 1 MG tablet Take 1 mg by mouth at bedtime.       . insulin glargine (LANTUS) 100 UNIT/ML injection Inject 20-40 Units into the skin at bedtime. As directed per sliding scale      . meloxicam (MOBIC) 15 MG tablet Take 15 mg by mouth at bedtime.       . metFORMIN (GLUCOPHAGE) 1000 MG tablet Take 1,000 mg by mouth 2 (two) times daily with a meal.        .  methotrexate (RHEUMATREX) 2.5 MG tablet Take 25 mg by mouth once a week. Caution:Chemotherapy. Protect from light. ON THURSDAYS      . metoprolol (LOPRESSOR) 50 MG tablet Take 50 mg by mouth 2 (two) times daily.        . quinapril (ACCUPRIL) 20 MG tablet Take 20 mg by mouth at bedtime.        . nitroGLYCERIN (NITROSTAT) 0.4 MG SL tablet Place 0.4 mg under the tongue every 5 (five) minutes as needed.          Results for orders placed during the hospital encounter of 12/21/11 (from the past 48 hour(s))  CBC     Status: Abnormal   Collection Time   12/21/11  2:00 PM      Component Value Range Comment   WBC 11.4 (*) 4.0 - 10.5 (K/uL)    RBC 5.42  4.22 - 5.81 (MIL/uL)    Hemoglobin 17.0  13.0 - 17.0 (g/dL)    HCT 91.4   78.2 - 95.6 (%)    MCV 89.9  78.0 - 100.0 (fL)    MCH 31.4  26.0 - 34.0 (pg)    MCHC 34.9  30.0 - 36.0 (g/dL)    RDW 21.3 (*) 08.6 - 15.5 (%)    Platelets 196  150 - 400 (K/uL)   COMPREHENSIVE METABOLIC PANEL     Status: Abnormal   Collection Time   12/21/11  2:00 PM      Component Value Range Comment   Sodium 135  135 - 145 (mEq/L)    Potassium 3.9  3.5 - 5.1 (mEq/L)    Chloride 100  96 - 112 (mEq/L)    CO2 24  19 - 32 (mEq/L)    Glucose, Bld 120 (*) 70 - 99 (mg/dL)    BUN 14  6 - 23 (mg/dL)    Creatinine, Ser 5.78  0.50 - 1.35 (mg/dL)    Calcium 46.9  8.4 - 10.5 (mg/dL)    Total Protein 8.0  6.0 - 8.3 (g/dL)    Albumin 3.9  3.5 - 5.2 (g/dL)    AST 14  0 - 37 (U/L)    ALT 20  0 - 53 (U/L)    Alkaline Phosphatase 92  39 - 117 (U/L)    Total Bilirubin 0.1 (*) 0.3 - 1.2 (mg/dL)    GFR calc non Af Amer >90  >90 (mL/min)    GFR calc Af Amer >90  >90 (mL/min)   PROTIME-INR     Status: Normal   Collection Time   12/21/11  2:00 PM      Component Value Range Comment   Prothrombin Time 12.8  11.6 - 15.2 (seconds)    INR 0.94  0.00 - 1.49    APTT     Status: Normal   Collection Time   12/21/11  2:00 PM      Component Value Range Comment   aPTT 27  24 - 37 (seconds)   POCT I-STAT TROPONIN I     Status: Normal   Collection Time   12/21/11  2:13 PM      Component Value Range Comment   Troponin i, poc 0.02  0.00 - 0.08 (ng/mL)    Comment 3            URINALYSIS, ROUTINE W REFLEX MICROSCOPIC     Status: Abnormal   Collection Time   12/21/11  5:55 PM      Component Value Range Comment   Color, Urine  YELLOW  YELLOW     APPearance CLEAR  CLEAR     Specific Gravity, Urine >1.030 (*) 1.005 - 1.030     pH 5.5  5.0 - 8.0     Glucose, UA NEGATIVE  NEGATIVE (mg/dL)    Hgb urine dipstick NEGATIVE  NEGATIVE     Bilirubin Urine NEGATIVE  NEGATIVE     Ketones, ur NEGATIVE  NEGATIVE (mg/dL)    Protein, ur NEGATIVE  NEGATIVE (mg/dL)    Urobilinogen, UA 0.2  0.0 - 1.0 (mg/dL)    Nitrite NEGATIVE   NEGATIVE     Leukocytes, UA NEGATIVE  NEGATIVE  MICROSCOPIC NOT DONE ON URINES WITH NEGATIVE PROTEIN, BLOOD, LEUKOCYTES, NITRITE, OR GLUCOSE <1000 mg/dL.  POCT I-STAT TROPONIN I     Status: Normal   Collection Time   12/21/11  6:39 PM      Component Value Range Comment   Troponin i, poc 0.00  0.00 - 0.08 (ng/mL)    Comment 3            GLUCOSE, CAPILLARY     Status: Abnormal   Collection Time   12/21/11  6:54 PM      Component Value Range Comment   Glucose-Capillary 134 (*) 70 - 99 (mg/dL)    Dg Chest Portable 1 View  12/21/2011  *RADIOLOGY REPORT*  Clinical Data: Chest pain.  PORTABLE CHEST - 1 VIEW  Comparison: 02/25/2005  Findings: Heart is borderline in size.  There is peribronchial thickening.  Increased markings in the lung bases, likely scarring. No effusions.  No acute bony abnormality.  IMPRESSION: Bronchitic changes.  Probable bibasilar scarring.  Original Report Authenticated By: Cyndie Chime, M.D.    Review of Systems  Constitutional: Negative.   HENT: Negative.   Eyes: Negative.   Respiratory: Positive for shortness of breath. Negative for cough, hemoptysis, sputum production and wheezing.   Cardiovascular: Positive for chest pain. Negative for palpitations, orthopnea, claudication, leg swelling and PND.  Gastrointestinal: Negative.   Genitourinary: Negative.   Musculoskeletal: Negative.   Skin: Negative.   Neurological: Negative.   Endo/Heme/Allergies: Negative.   Psychiatric/Behavioral: Negative.     Blood pressure 142/73, pulse 96, temperature 97.6 F (36.4 C), temperature source Oral, resp. rate 20, SpO2 97.00%. Physical Exam  Constitutional: He is oriented to person, place, and time. He appears well-developed and well-nourished.  HENT:  Head: Normocephalic and atraumatic.  Right Ear: External ear normal.  Left Ear: External ear normal.  Nose: Nose normal.  Mouth/Throat: Oropharynx is clear and moist.  Eyes: Conjunctivae and EOM are normal. Pupils are  equal, round, and reactive to light.  Neck: Normal range of motion. Neck supple.  Cardiovascular: Normal rate, regular rhythm, normal heart sounds and intact distal pulses.   Respiratory: Effort normal and breath sounds normal.  GI: Soft. Bowel sounds are normal.  Musculoskeletal: Normal range of motion.  Neurological: He is alert and oriented to person, place, and time. He has normal reflexes.  Skin: Skin is warm and dry.  Psychiatric: He has a normal mood and affect. His behavior is normal. Judgment and thought content normal.     Assessment/Plan Assessment this is a 61 year old gentleman man with known coronary artery disease presenting with what appears to be an angina. His enzymes and EKG are currently  Unremarkable. However due to his risk factors humidify the evaluation including possibly a cardiac cath. Plan 1 chest pain: Patient will be admitted we'll check serial cardiac enzymes given nitroglycerin as needed morphine oxygen  aspirin and continue with his beta blocker and other medications. We will consult [facility] for further input. #2 diabetes: Patient will be on sliding scale insulin as well as his home therapy except for the metformin #3 hypertension: His blood pressure seems well controlled we will continue with his home medication. #4 tobacco abuse: Patient is tobacco cessation counseling and I offered him nicotine patch which he declined.  Breckin Zafar,LAWAL 12/21/2011, 11:29 PM

## 2011-12-21 NOTE — ED Notes (Signed)
Bed Control called again to check on status of bed.  Reported that he was up next for a bed and one was being cleaned.. Nurse informed.

## 2011-12-21 NOTE — ED Notes (Signed)
Chest pain onset this am. 

## 2011-12-21 NOTE — ED Notes (Signed)
Woke this am and was having left side chest pain that is achy, constant, one episode of nausea, no vomiting, having normal sinus drainage for several days.  Denies cough or fever.  H/o cardiac stents and mi x5.  Has taken no asa in last 12 hours and not on any male enhancement drugs. Rates pain 2/10 at current time, given 1 sl ntg.  Vss, wife at bedside

## 2011-12-21 NOTE — ED Notes (Signed)
Called to give report, nurse to call me back 

## 2011-12-21 NOTE — ED Notes (Signed)
Gave report to [facility].  ETA 20 mins.

## 2011-12-21 NOTE — ED Notes (Signed)
Pain free after 1 ntg.

## 2011-12-21 NOTE — ED Provider Notes (Signed)
History     CSN: [id number]  Arrival date & time 12/21/11  1325   First MD Initiated Contact with Patient 12/21/11 1345      Chief Complaint  Patient presents with  . Chest Pain    (Consider location/radiation/quality/duration/timing/severity/associated sxs/prior treatment) Patient is a 61 y.o. male presenting with chest pain. The history is provided by the patient.  Chest Pain The chest pain began 3 - 5 hours ago. Chest pain occurs constantly. The chest pain is improving. At its most intense, the pain is at 7/10. The pain is currently at 3/10. The quality of the pain is described as burning. The pain does not radiate. Primary symptoms include fatigue, cough, nausea and dizziness. Pertinent negatives for primary symptoms include no fever, no syncope, no shortness of breath, no wheezing, no palpitations, no abdominal pain and no vomiting.  Dizziness also occurs with nausea and diaphoresis. Dizziness does not occur with vomiting.   Associated symptoms include diaphoresis. He tried antacids for the symptoms. Risk factors include sedentary lifestyle, smoking/tobacco exposure and obesity.  His past medical history is significant for CAD and diabetes. Procedure history comments: cardiologist Dr. Flint Melter. Last cath 2008 with stent.     Past Medical History  Diagnosis Date  . Rheumatoid arthritis   . MI (myocardial infarction)   . Diabetes mellitus   . Hypertension     Past Surgical History  Procedure Date  . Coronary stent placement   . Back surgery   . Cholecystectomy   . Abdominal surgery   . Cardiac surgery     No family history on file.  History  Substance Use Topics  . Smoking status: Current Everyday Smoker -- 1.0 packs/day    Types: Cigarettes  . Smokeless tobacco: Not on file  . Alcohol Use: No      Review of Systems  Unable to perform ROS Constitutional: Positive for diaphoresis and fatigue. Negative for fever.  Respiratory: Positive for cough.  Negative for shortness of breath and wheezing.   Cardiovascular: Positive for chest pain. Negative for palpitations and syncope.  Gastrointestinal: Positive for nausea. Negative for vomiting and abdominal pain.  Neurological: Positive for dizziness.    Allergies  Ivp dye and Plavix  Home Medications   Current Outpatient Rx  Name Route Sig Dispense Refill  . ASPIRIN 81 MG PO TABS Oral Take 81 mg by mouth daily.      [name] CILOSTAZOL 100 MG PO TABS Oral Take 100 mg by mouth 2 (two) times daily.      [name] FOLIC ACID 1 MG PO TABS Oral Take 1 mg by mouth daily.      [name] GLIPIZIDE 5 MG PO TABS Oral Take 5 mg by mouth 2 (two) times daily before a meal.      . INSULIN GLARGINE 100 UNIT/ML [place] SOLN Subcutaneous Inject into the skin at bedtime.      . MELOXICAM 15 MG PO TABS Oral Take 15 mg by mouth daily.      [name] METFORMIN HCL 1000 MG PO TABS Oral Take 1,000 mg by mouth 2 (two) times daily with a meal.      . METHOTREXATE 2.5 MG PO TABS Oral Take 2.5 mg by mouth once a week. Caution:Chemotherapy. Protect from light.     [name] METOPROLOL TARTRATE 50 MG PO TABS Oral Take 50 mg by mouth 2 (two) times daily.      [name] NIACIN ER (ANTIHYPERLIPIDEMIC) 1000 MG PO TBCR Oral Take 1,000 mg by mouth at  bedtime.      [name] NITROGLYCERIN 0.4 MG SL SUBL Sublingual Place 0.4 mg under the tongue every 5 (five) minutes as needed.      . QUINAPRIL HCL 20 MG PO TABS Oral Take 20 mg by mouth at bedtime.      [name] SIMVASTATIN 80 MG PO TABS Oral Take 80 mg by mouth at bedtime.      . TICLOPIDINE HCL 250 MG PO TABS Oral Take 250 mg by mouth 2 (two) times daily.        BP 152/87  Pulse 88  Temp(Src) 97.6 F (36.4 C) (Oral)  Resp 20  SpO2 100%  Physical Exam  Nursing note and vitals reviewed. Constitutional: He is oriented to person, place, and time. He appears well-developed and well-nourished.  HENT:  Head: Normocephalic and atraumatic.  Right Ear: External ear normal.  Left Ear: External ear normal.  Nose: Nose normal.    Mouth/Throat: Oropharynx is clear and moist.  Eyes: Conjunctivae and EOM are normal. Pupils are equal, round, and reactive to light.  Neck: Normal range of motion. Neck supple.  Cardiovascular: Normal rate, regular rhythm, normal heart sounds and intact distal pulses.   Pulmonary/Chest: Effort normal and breath sounds normal.  Abdominal: Soft. Bowel sounds are normal.  Musculoskeletal: Normal range of motion.  Neurological: He is alert and oriented to person, place, and time. He has normal reflexes.  Skin: Skin is warm and dry.  Psychiatric: He has a normal mood and affect. His behavior is normal. Thought content normal.    ED Course  Procedures (including critical care time)  Labs Reviewed - No data to display No results found.   No diagnosis found.   Date: 12/21/2011  Rate: 87  Rhythm: normal sinus rhythm  QRS Axis: normal  Intervals: normal  ST/T Wave abnormalities: normal  Conduction Disutrbances:incomplete rbbb  Narrative Interpretation:   Old EKG Reviewed: changes noted    MDM  Patient's care discussed with Dr. [name] at [facility]. Patient is started on heparin. Dr. [name] has accepted the patient to a telemetry bed at [facility]. The patient is pain-free after receiving one supplemental nitroglycerin here. His EKG does not show any acute changes and his first troponin is negative. He is allergic to Plavix. He is taking Lopressor daily.  Patient with delay in transfer. He began having some diaphoresis but no additional chest pain. Repeat EKG continued to show normal sinus rhythm with no evidence of ST changes. Repeat troponin is normal. He was hypertensive and heart rate was in the low 90s. He was given Lopressor 5 mg IV.     Hilario Quarry, MD 12/21/11 [phone]

## 2011-12-21 NOTE — Consult Note (Signed)
ANTICOAGULATION CONSULT NOTE - Initial Consult  Pharmacy Consult for Heparin Indication: chest pain/ACS  Allergies  Allergen Reactions  . Ivp Dye (Iodinated Diagnostic Agents) Hives and Itching  . Plavix (Clopidogrel Bisulfate) Rash    Patient Measurements:   Heparin Dosing Weight: 83Kg  Vital Signs: Temp: 97.6 F (36.4 C) (04/11 1329) Temp src: Oral (04/11 1329) BP: 152/87 mmHg (04/11 1329) Pulse Rate: 88  (04/11 1329)  Labs:  Basename 12/21/11 1400  HGB 17.0  HCT 48.7  PLT 196  APTT 27  LABPROT 12.8  INR 0.94  HEPARINUNFRC --  CREATININE 0.90  CKTOTAL --  CKMB --  TROPONINI --   The CrCl is unknown because both a height and weight (above a minimum accepted value) are required for this calculation.  Medical History: Past Medical History  Diagnosis Date  . Rheumatoid arthritis   . MI (myocardial infarction)   . Diabetes mellitus   . Hypertension    Medications:  Scheduled:    . aspirin  324 mg Oral Once  . heparin  4,000 Units Intravenous Once   Assessment: Platelets and H/H OK  Goal of Therapy:  Heparin level 0.3-0.7 units/ml   Plan: Heparin 4000 unit bolus then 1300 units/hr Heparin level in 6 hours and then daily Labs per protocol, monitor CBC  Valrie Hart A 12/21/2011,3:20 PM

## 2011-12-22 ENCOUNTER — Encounter (HOSPITAL_COMMUNITY)
Admission: EM | Disposition: A | Discharge status: Home / Self Care | Payer: Self-pay | Source: Home / Self Care | Attending: Emergency Medicine

## 2011-12-22 ENCOUNTER — Encounter (HOSPITAL_COMMUNITY): Payer: Self-pay | Admitting: General Practice

## 2011-12-22 DIAGNOSIS — Z91041 Radiographic dye allergy status: Secondary | ICD-10-CM | POA: Diagnosis present

## 2011-12-22 DIAGNOSIS — I739 Peripheral vascular disease, unspecified: Secondary | ICD-10-CM | POA: Diagnosis present

## 2011-12-22 DIAGNOSIS — M069 Rheumatoid arthritis, unspecified: Secondary | ICD-10-CM | POA: Diagnosis present

## 2011-12-22 HISTORY — PX: LEFT HEART CATHETERIZATION WITH CORONARY ANGIOGRAM: SHX5451

## 2011-12-22 HISTORY — PX: PERCUTANEOUS CORONARY INTERVENTION-BALLOON ONLY: SHX6014

## 2011-12-22 LAB — BASIC METABOLIC PANEL
BUN: 13 mg/dL (ref 6–23)
CO2: 20 mEq/L (ref 19–32)
Calcium: 9.2 mg/dL (ref 8.4–10.5)
Chloride: 101 mEq/L (ref 96–112)
Creatinine, Ser: 0.86 mg/dL (ref 0.50–1.35)
GFR calc Af Amer: >90 mL/min (ref >90)
GFR calc non Af Amer: >90 mL/min (ref >90)
Glucose, Bld: 186 mg/dL — ABNORMAL HIGH (ref 70–99)
Potassium: 4.3 mEq/L (ref 3.5–5.1)
Sodium: 134 mEq/L — ABNORMAL LOW (ref 135–145)

## 2011-12-22 LAB — GLUCOSE, CAPILLARY
Glucose-Capillary: 115 mg/dL — ABNORMAL HIGH (ref 70–99)
Glucose-Capillary: 154 mg/dL — ABNORMAL HIGH (ref 70–99)
Glucose-Capillary: 248 mg/dL — ABNORMAL HIGH (ref 70–99)
Glucose-Capillary: 253 mg/dL — ABNORMAL HIGH (ref 70–99)

## 2011-12-22 LAB — CARDIAC PANEL(CRET KIN+CKTOT+MB+TROPI)
Relative Index: INVALID (ref 0.0–2.5)
Relative Index: INVALID (ref 0.0–2.5)
Total CK: 44 U/L (ref 7–232)
Troponin I: <0.3 ng/mL (ref <0.30)
Troponin I: <0.3 ng/mL (ref <0.30)

## 2011-12-22 LAB — CBC
HCT: 49 % (ref 39.0–52.0)
Hemoglobin: 17 g/dL (ref 13.0–17.0)
MCH: 31.1 pg (ref 26.0–34.0)
MCHC: 34.7 g/dL (ref 30.0–36.0)
MCV: 89.7 fL (ref 78.0–100.0)
RDW: 16.4 % — ABNORMAL HIGH (ref 11.5–15.5)

## 2011-12-22 LAB — LIPID PANEL
Cholesterol: 146 mg/dL (ref 0–200)
HDL: 34 mg/dL — ABNORMAL LOW (ref >39)
LDL Cholesterol: 77 mg/dL (ref 0–99)
Total CHOL/HDL Ratio: 4.3 RATIO
Triglycerides: 175 mg/dL — ABNORMAL HIGH (ref <150)
VLDL: 35 mg/dL (ref 0–40)

## 2011-12-22 LAB — COMPREHENSIVE METABOLIC PANEL
ALT: 23 U/L (ref 0–53)
AST: 19 U/L (ref 0–37)
Alkaline Phosphatase: 86 U/L (ref 39–117)
Calcium: 9.6 mg/dL (ref 8.4–10.5)
Glucose, Bld: 104 mg/dL — ABNORMAL HIGH (ref 70–99)
Potassium: 3.8 mEq/L (ref 3.5–5.1)
Sodium: 138 mEq/L (ref 135–145)
Total Protein: 7.2 g/dL (ref 6.0–8.3)

## 2011-12-22 LAB — CREATININE, SERUM
GFR calc Af Amer: >90 mL/min (ref >90)
GFR calc non Af Amer: >90 mL/min (ref >90)

## 2011-12-22 LAB — POCT ACTIVATED CLOTTING TIME
Activated Clotting Time: 127 seconds
Activated Clotting Time: 325 seconds

## 2011-12-22 LAB — URINALYSIS, ROUTINE W REFLEX MICROSCOPIC
Ketones, ur: 15 mg/dL — AB
Leukocytes, UA: NEGATIVE
Nitrite: NEGATIVE
Protein, ur: NEGATIVE mg/dL
Urobilinogen, UA: 0.2 mg/dL (ref 0.0–1.0)

## 2011-12-22 LAB — HEPARIN LEVEL (UNFRACTIONATED): Heparin Unfractionated: 0.16 IU/mL — ABNORMAL LOW (ref 0.30–0.70)

## 2011-12-22 LAB — PRO B NATRIURETIC PEPTIDE: Pro B Natriuretic peptide (BNP): 57.6 pg/mL (ref 0–125)

## 2011-12-22 SURGERY — LEFT HEART CATHETERIZATION WITH CORONARY ANGIOGRAM
Anesthesia: LOCAL

## 2011-12-22 MED ORDER — NITROGLYCERIN IN D5W 200-5 MCG/ML-% IV SOLN
INTRAVENOUS | Status: AC
  Filled 2011-12-22: qty 250

## 2011-12-22 MED ORDER — BIVALIRUDIN 250 MG IV SOLR
INTRAVENOUS | Status: AC
  Filled 2011-12-22: qty 250

## 2011-12-22 MED ORDER — HEPARIN (PORCINE) IN NACL 2-0.9 UNIT/ML-% IJ SOLN
INTRAMUSCULAR | Status: AC
  Filled 2011-12-22: qty 2000

## 2011-12-22 MED ORDER — METHOTREXATE 2.5 MG PO TABS: 25.0000 mg | ORAL_TABLET | ORAL | Status: DC

## 2011-12-22 MED ORDER — ZOLPIDEM TARTRATE 5 MG PO TABS
10.0000 mg | ORAL_TABLET | Freq: Every evening | ORAL | Status: DC | PRN
  Filled 2011-12-22: qty 2

## 2011-12-22 MED ORDER — ASPIRIN 81 MG PO CHEW
324.0000 mg | CHEWABLE_TABLET | ORAL | Status: AC
  Administered 2011-12-22: 324 mg via ORAL
  Filled 2011-12-22: qty 4

## 2011-12-22 MED ORDER — PRASUGREL HCL 10 MG PO TABS
ORAL_TABLET | ORAL | Status: AC
  Administered 2011-12-23: 10 mg via ORAL
  Filled 2011-12-22: qty 6

## 2011-12-22 MED ORDER — FAMOTIDINE IN NACL 20-0.9 MG/50ML-% IV SOLN
20.0000 mg | INTRAVENOUS | Status: AC
  Administered 2011-12-22: 20 mg via INTRAVENOUS
  Filled 2011-12-22: qty 50

## 2011-12-22 MED ORDER — NITROGLYCERIN 0.2 MG/ML ON CALL CATH LAB
INTRAVENOUS | Status: AC
  Filled 2011-12-22: qty 1

## 2011-12-22 MED ORDER — PRASUGREL HCL 10 MG PO TABS
10.0000 mg | ORAL_TABLET | Freq: Every day | ORAL | Status: DC
  Administered 2011-12-23: 10 mg via ORAL
  Filled 2011-12-22 (×2): qty 1

## 2011-12-22 MED ORDER — SODIUM CHLORIDE 0.9 % IV SOLN
INTRAVENOUS | Status: DC
  Administered 2011-12-22: 12:00:00 via INTRAVENOUS

## 2011-12-22 MED ORDER — DIAZEPAM 5 MG PO TABS
5.0000 mg | ORAL_TABLET | ORAL | Status: AC
  Administered 2011-12-22: 5 mg via ORAL
  Filled 2011-12-22: qty 1

## 2011-12-22 MED ORDER — ACETAMINOPHEN 325 MG PO TABS: 650.0000 mg | ORAL_TABLET | ORAL | Status: DC | PRN

## 2011-12-22 MED ORDER — LIDOCAINE HCL (PF) 1 % IJ SOLN
INTRAMUSCULAR | Status: AC
  Filled 2011-12-22: qty 30

## 2011-12-22 MED ORDER — HEPARIN BOLUS VIA INFUSION
2000.0000 [IU] | Freq: Once | INTRAVENOUS | Status: AC
  Administered 2011-12-22: 2000 [IU] via INTRAVENOUS
  Filled 2011-12-22: qty 2000

## 2011-12-22 MED ORDER — ONDANSETRON HCL 4 MG/2ML IJ SOLN: 4.0000 mg | Freq: Four times a day (QID) | INTRAMUSCULAR | Status: DC | PRN

## 2011-12-22 MED ORDER — MIDAZOLAM HCL 2 MG/2ML IJ SOLN
INTRAMUSCULAR | Status: AC
  Filled 2011-12-22: qty 2

## 2011-12-22 MED ORDER — TRAMADOL HCL 50 MG PO TABS
50.0000 mg | ORAL_TABLET | Freq: Four times a day (QID) | ORAL | Status: DC | PRN
  Administered 2011-12-22: 50 mg via ORAL
  Filled 2011-12-22: qty 1

## 2011-12-22 MED ORDER — SODIUM CHLORIDE 0.9 % IV SOLN
INTRAVENOUS | Status: DC
  Administered 2011-12-22: 09:00:00 via INTRAVENOUS

## 2011-12-22 MED ORDER — DIPHENHYDRAMINE HCL 50 MG/ML IJ SOLN
25.0000 mg | INTRAMUSCULAR | Status: AC
  Administered 2011-12-22: 25 mg via INTRAVENOUS
  Filled 2011-12-22: qty 1

## 2011-12-22 MED ORDER — SODIUM CHLORIDE 0.9 % IJ SOLN: 3.0000 mL | INTRAMUSCULAR | Status: DC | PRN

## 2011-12-22 MED ORDER — METHYLPREDNISOLONE SODIUM SUCC 125 MG IJ SOLR
125.0000 mg | INTRAMUSCULAR | Status: AC
  Administered 2011-12-22: 125 mg via INTRAVENOUS

## 2011-12-22 MED ORDER — NITROGLYCERIN IN D5W 200-5 MCG/ML-% IV SOLN: 2.0000 ug/min | INTRAVENOUS | Status: DC

## 2011-12-22 MED ORDER — SODIUM CHLORIDE 0.9 % IV SOLN
0.2500 mg/kg/h | INTRAVENOUS | Status: AC
  Administered 2011-12-22: 0.25 mg/kg/h via INTRAVENOUS
  Filled 2011-12-22: qty 250

## 2011-12-22 MED ORDER — SODIUM CHLORIDE 0.9 % IV SOLN: 250.0000 mL | INTRAVENOUS | Status: DC | PRN

## 2011-12-22 MED ORDER — LORATADINE 10 MG PO TABS
10.0000 mg | ORAL_TABLET | Freq: Every day | ORAL | Status: DC
  Administered 2011-12-22 – 2011-12-23 (×2): 10 mg via ORAL
  Filled 2011-12-22 (×3): qty 1

## 2011-12-22 MED ORDER — ASPIRIN 81 MG PO CHEW
81.0000 mg | CHEWABLE_TABLET | Freq: Every day | ORAL | Status: DC
  Administered 2011-12-23: 81 mg via ORAL
  Filled 2011-12-22: qty 1

## 2011-12-22 MED ORDER — ALPRAZOLAM 0.25 MG PO TABS
0.2500 mg | ORAL_TABLET | Freq: Three times a day (TID) | ORAL | Status: DC | PRN
  Administered 2011-12-22: 0.25 mg via ORAL
  Filled 2011-12-22: qty 1

## 2011-12-22 MED ORDER — FENTANYL CITRATE 0.05 MG/ML IJ SOLN
INTRAMUSCULAR | Status: AC
  Filled 2011-12-22: qty 2

## 2011-12-22 NOTE — Progress Notes (Signed)
ANTICOAGULATION CONSULT NOTE - Follow Up Consult  Pharmacy Consult for heparin Indication: chest pain/ACS  Allergies  Allergen Reactions  . Ivp Dye (Iodinated Diagnostic Agents) Hives and Itching  . Plavix (Clopidogrel Bisulfate) Rash    Patient Measurements: Height: 6' (182.9 cm) Weight: 236 lb 11.2 oz (107.366 kg) IBW/kg (Calculated) : 77.6  Heparin Dosing Weight: 83kg  Vital Signs: Temp: 98.1 F (36.7 C) (04/12 0500) Temp src: Oral (04/12 0500) BP: 110/74 mmHg (04/12 0500) Pulse Rate: 80  (04/12 0500)  Labs:  Basename 12/22/11 0700 12/22/11 0645 12/21/11 2343 12/21/11 2342 12/21/11 1400  HGB -- 17.0 16.7 -- --  HCT -- 49.0 47.5 -- 48.7  PLT -- 192 172 -- 196  APTT -- -- -- -- 27  LABPROT -- -- -- -- 12.8  INR -- -- -- -- 0.94  HEPARINUNFRC -- 0.16* -- -- --  CREATININE -- -- 0.92 -- 0.90  CKTOTAL PENDING -- -- 44 --  CKMB 1.4 -- -- 1.5 --  TROPONINI <0.30 -- -- <0.30 --   Estimated Creatinine Clearance: 110.8 ml/min (by C-G formula based on Cr of 0.92).  Medications:  Infusions:    . sodium chloride 100 mL/hr at 12/22/11 0615  . sodium chloride 20 mL (12/21/11 1406)  . sodium chloride    . heparin 1,300 Units/hr (12/22/11 0615)   Assessment: 60 yom admitted for chest pain with plan for cardiac cath today. Started on heparin and first heparin level is subtherapeutic at 0.16. No bleeding noted, CBC is stable.   Goal of Therapy:  Heparin level 0.3-0.7 units/ml   Plan:  1. Heparin bolus 2000 units IV x 1 2. Increase heparin gtt to 1550units/hr 3. Check a 6 hour heparin level or f/u after cath  Robert [name], Robert [name] 12/22/2011,8:25 AM

## 2011-12-22 NOTE — Consult Note (Signed)
Reason for Consult: Chest pain  Requesting Physician: [facility]  HPI: This is a 61 y.o. male with a past medical history significant for CAD. He had an MI in 1995 and was found to have a total RCA treated medically. He had an intervention in 2004 to the CFX. In 2006 he had a 2 site CFX DES. His last intervention was Sept 2007 with CFX DES and OM POBA. He has done well from a cardiac standpoint since. He is followed in [place] by Dr Sherwood Gambler and in our [facility]. Yesterday he developed discomfort across his upper chest which he initially thought was indigestion. His usual angina is arm pain. When his symptoms did not improve after antacid he went to [facility] ER. In th ER his symptoms resolved with NTG. He is transferred now for further evaluation. He is currently pain free on Heparin and NTG and Troponin is negative X 2. He does have a true contrast allergy and a Plavix allergy.   PMHx:  Past Medical History  Diagnosis Date  . Rheumatoid arthritis   . MI (myocardial infarction)   . Diabetes mellitus   . Hypertension   . Coronary artery disease   . Dysrhythmia   . CHF (congestive heart failure)   . Shortness of breath   . Unstable angina 12/22/2011  . PVD, chronic LLE 12/22/2011   Past Surgical History  Procedure Date  . Coronary stent placement   . Back surgery   . Cholecystectomy   . Abdominal surgery   . Cardiac surgery     FAMHx: History reviewed. No pertinent family history. Colon cancer  SOCHx:  reports that he has been smoking Cigarettes.  He has a 25 pack-year smoking history. He has never used smokeless tobacco. He reports that he does not drink alcohol or use illicit drugs.  ALLERGIES: Allergies  Allergen Reactions  . Ivp Dye (Iodinated Diagnostic Agents) Hives and Itching  . Plavix (Clopidogrel Bisulfate) Rash    ROS: He has had a negative colonoscopy in the past. He has chronic claudication in his Lt leg. He has had OP evaluation by Dr Allyson Sabal for this and was  offered intervention but declined as it was not clear if this would improve his symptoms. He has recently been diagnosed with Rheumatoid Arthritis. He complains of congestion in his Lt ear and chronic sinusitis. Otherwise a comprehensive review of systems was negative.   HOME MEDICATIONS: Prescriptions prior to admission  Medication Sig Dispense Refill  . aspirin EC 325 MG tablet Take 325 mg by mouth daily.      . cilostazol (PLETAL) 100 MG tablet Take 100 mg by mouth 2 (two) times daily.        . fish oil-omega-3 fatty acids 1000 MG capsule Take 1 g by mouth 2 (two) times daily.      . folic acid (FOLVITE) 1 MG tablet Take 1 mg by mouth at bedtime.       [name] glipiZIDE (GLUCOTROL XL) 10 MG 24 hr tablet Take 10 mg by mouth 2 (two) times daily.      . hydroxychloroquine (PLAQUENIL) 200 MG tablet Take 200 mg by mouth 2 (two) times daily.      . insulin glargine (LANTUS) 100 UNIT/ML injection Inject 20-40 Units into the skin at bedtime. As directed per sliding scale      . meloxicam (MOBIC) 15 MG tablet Take 15 mg by mouth at bedtime.       . metFORMIN (GLUCOPHAGE) 1000 MG tablet  Take 1,000 mg by mouth 2 (two) times daily with a meal.        . methotrexate (RHEUMATREX) 2.5 MG tablet Take 25 mg by mouth once a week. Caution:Chemotherapy. Protect from light. ON THURSDAYS      . metoprolol (LOPRESSOR) 50 MG tablet Take 50 mg by mouth 2 (two) times daily.        . quinapril (ACCUPRIL) 20 MG tablet Take 20 mg by mouth at bedtime.        . nitroGLYCERIN (NITROSTAT) 0.4 MG SL tablet Place 0.4 mg under the tongue every 5 (five) minutes as needed.          HOSPITAL MEDICATIONS: I have reviewed the patient's current medications.  VITALS: Blood pressure 110/74, pulse 80, temperature 98.1 F (36.7 C), temperature source Oral, resp. rate 18, height 6' (1.829 m), weight 107.366 kg (236 lb 11.2 oz), SpO2 94.00%.  PHYSICAL EXAM: General appearance: alert, cooperative and no distress Neck: no carotid bruit,  no JVD, supple, symmetrical, trachea midline and thyroid not enlarged, symmetric, no tenderness/mass/nodules Lungs: clear to auscultation bilaterally Heart: regular rate and rhythm, S1, S2 normal, no murmur, click, rub or gallop Abdomen: soft, non-tender; bowel sounds normal; no masses,  no organomegaly and small umbilical hernia Extremities: extremities normal, atraumatic, no cyanosis or edema Pulses: 2/4 on RLE, 0/4 on LLE Skin: Skin color, texture, turgor normal. No rashes or lesions Neurologic: Grossly normal  LABS: Results for orders placed during the hospital encounter of 12/21/11 (from the past 48 hour(s))  CBC     Status: Abnormal   Collection Time   12/21/11  2:00 PM      Component Value Range Comment   WBC 11.4 (*) 4.0 - 10.5 (K/uL)    RBC 5.42  4.22 - 5.81 (MIL/uL)    Hemoglobin 17.0  13.0 - 17.0 (g/dL)    HCT 09.8  11.9 - 14.7 (%)    MCV 89.9  78.0 - 100.0 (fL)    MCH 31.4  26.0 - 34.0 (pg)    MCHC 34.9  30.0 - 36.0 (g/dL)    RDW 82.9 (*) 56.2 - 15.5 (%)    Platelets 196  150 - 400 (K/uL)   COMPREHENSIVE METABOLIC PANEL     Status: Abnormal   Collection Time   12/21/11  2:00 PM      Component Value Range Comment   Sodium 135  135 - 145 (mEq/L)    Potassium 3.9  3.5 - 5.1 (mEq/L)    Chloride 100  96 - 112 (mEq/L)    CO2 24  19 - 32 (mEq/L)    Glucose, Bld 120 (*) 70 - 99 (mg/dL)    BUN 14  6 - 23 (mg/dL)    Creatinine, Ser 1.30  0.50 - 1.35 (mg/dL)    Calcium 86.5  8.4 - 10.5 (mg/dL)    Total Protein 8.0  6.0 - 8.3 (g/dL)    Albumin 3.9  3.5 - 5.2 (g/dL)    AST 14  0 - 37 (U/L)    ALT 20  0 - 53 (U/L)    Alkaline Phosphatase 92  39 - 117 (U/L)    Total Bilirubin 0.1 (*) 0.3 - 1.2 (mg/dL)    GFR calc non Af Amer >90  >90 (mL/min)    GFR calc Af Amer >90  >90 (mL/min)   PROTIME-INR     Status: Normal   Collection Time   12/21/11  2:00 PM      Component Value  Range Comment   Prothrombin Time 12.8  11.6 - 15.2 (seconds)    INR 0.94  0.00 - 1.49    APTT      Status: Normal   Collection Time   12/21/11  2:00 PM      Component Value Range Comment   aPTT 27  24 - 37 (seconds)   POCT I-STAT TROPONIN I     Status: Normal   Collection Time   12/21/11  2:13 PM      Component Value Range Comment   Troponin i, poc 0.02  0.00 - 0.08 (ng/mL)    Comment 3            URINALYSIS, ROUTINE W REFLEX MICROSCOPIC     Status: Abnormal   Collection Time   12/21/11  5:55 PM      Component Value Range Comment   Color, Urine YELLOW  YELLOW     APPearance CLEAR  CLEAR     Specific Gravity, Urine >1.030 (*) 1.005 - 1.030     pH 5.5  5.0 - 8.0     Glucose, UA NEGATIVE  NEGATIVE (mg/dL)    Hgb urine dipstick NEGATIVE  NEGATIVE     Bilirubin Urine NEGATIVE  NEGATIVE     Ketones, ur NEGATIVE  NEGATIVE (mg/dL)    Protein, ur NEGATIVE  NEGATIVE (mg/dL)    Urobilinogen, UA 0.2  0.0 - 1.0 (mg/dL)    Nitrite NEGATIVE  NEGATIVE     Leukocytes, UA NEGATIVE  NEGATIVE  MICROSCOPIC NOT DONE ON URINES WITH NEGATIVE PROTEIN, BLOOD, LEUKOCYTES, NITRITE, OR GLUCOSE <1000 mg/dL.  POCT I-STAT TROPONIN I     Status: Normal   Collection Time   12/21/11  6:39 PM      Component Value Range Comment   Troponin i, poc 0.00  0.00 - 0.08 (ng/mL)    Comment 3            GLUCOSE, CAPILLARY     Status: Abnormal   Collection Time   12/21/11  6:54 PM      Component Value Range Comment   Glucose-Capillary 134 (*) 70 - 99 (mg/dL)   CARDIAC PANEL(CRET KIN+CKTOT+MB+TROPI)     Status: Normal   Collection Time   12/21/11 11:42 PM      Component Value Range Comment   Total CK 44  7 - 232 (U/L)    CK, MB 1.5  0.3 - 4.0 (ng/mL)    Troponin I <0.30  <0.30 (ng/mL)    Relative Index RELATIVE INDEX IS INVALID  0.0 - 2.5    PRO B NATRIURETIC PEPTIDE     Status: Normal   Collection Time   12/21/11 11:42 PM      Component Value Range Comment   Pro B Natriuretic peptide (BNP) 57.6  0 - 125 (pg/mL)   HEMOGLOBIN A1C     Status: Abnormal   Collection Time   12/21/11 11:43 PM      Component Value Range  Comment   Hemoglobin A1C 8.7 (*) <5.7 (%)    Mean Plasma Glucose 203 (*) <117 (mg/dL)   CBC     Status: Abnormal   Collection Time   12/21/11 11:43 PM      Component Value Range Comment   WBC 10.7 (*) 4.0 - 10.5 (K/uL)    RBC 5.34  4.22 - 5.81 (MIL/uL)    Hemoglobin 16.7  13.0 - 17.0 (g/dL)    HCT 16.1  09.6 - 04.5 (%)  MCV 89.0  78.0 - 100.0 (fL)    MCH 31.3  26.0 - 34.0 (pg)    MCHC 35.2  30.0 - 36.0 (g/dL)    RDW 14.7 (*) 82.9 - 15.5 (%)    Platelets 172  150 - 400 (K/uL)   CREATININE, SERUM     Status: Normal   Collection Time   12/21/11 11:43 PM      Component Value Range Comment   Creatinine, Ser 0.92  0.50 - 1.35 (mg/dL)    GFR calc non Af Amer >90  >90 (mL/min)    GFR calc Af Amer >90  >90 (mL/min)   TSH     Status: Normal   Collection Time   12/21/11 11:43 PM      Component Value Range Comment   TSH 2.329  0.350 - 4.500 (uIU/mL)   CBC     Status: Abnormal   Collection Time   12/22/11  6:45 AM      Component Value Range Comment   WBC 11.8 (*) 4.0 - 10.5 (K/uL)    RBC 5.46  4.22 - 5.81 (MIL/uL)    Hemoglobin 17.0  13.0 - 17.0 (g/dL)    HCT 56.2  13.0 - 86.5 (%)    MCV 89.7  78.0 - 100.0 (fL)    MCH 31.1  26.0 - 34.0 (pg)    MCHC 34.7  30.0 - 36.0 (g/dL)    RDW 78.4 (*) 69.6 - 15.5 (%)    Platelets 192  150 - 400 (K/uL)   HEPARIN LEVEL (UNFRACTIONATED)     Status: Abnormal   Collection Time   12/22/11  6:45 AM      Component Value Range Comment   Heparin Unfractionated 0.16 (*) 0.30 - 0.70 (IU/mL)   CARDIAC PANEL(CRET KIN+CKTOT+MB+TROPI)     Status: Normal   Collection Time   12/22/11  7:00 AM      Component Value Range Comment   Total CK 48  7 - 232 (U/L)    CK, MB 1.4  0.3 - 4.0 (ng/mL)    Troponin I <0.30  <0.30 (ng/mL)    Relative Index RELATIVE INDEX IS INVALID  0.0 - 2.5    GLUCOSE, CAPILLARY     Status: Abnormal   Collection Time   12/22/11  7:34 AM      Component Value Range Comment   Glucose-Capillary 115 (*) 70 - 99 (mg/dL)    Comment 1 Notify RN        IMAGING: Dg Chest Portable 1 View  12/21/2011  *RADIOLOGY REPORT*  Clinical Data: Chest pain.  PORTABLE CHEST - 1 VIEW  Comparison: 02/25/2005  Findings: Heart is borderline in size.  There is peribronchial thickening.  Increased markings in the lung bases, likely scarring. No effusions.  No acute bony abnormality.  IMPRESSION: Bronchitic changes.  Probable bibasilar scarring.  Original Report Authenticated By: Cyndie Chime, M.D.    IMPRESSION:  Principal Problem:  *Unstable angina  Active Problems:  Type II IDDM  Tobacco abuse  CAD, MI '95, CFX stent '04, CFX DES 9/06, CFX DES 8/07 with OM POBA  Contrast media allergy  HTN (hypertension)  PVD, chronic LLE  Rheumatoid arthritis   RECOMMENDATION: Cardiac cath, will premedicate for contrast allergy. We will take on our service.  Time Spent Directly with Patient: 45 minutes  KILROY,LUKE K 12/22/2011, 8:29 AM     Patient seen and examined; chart reviewed.  Agree with assessment and plan.  Pt has history of remote IMI  1995 secondary to RCA occlusion with collaterals.  He has had PCI to LCX in 2004, 2006, and last 2007.  He was admitted in transfer from [facility] last night with nitrate responsive recurrent chest pain.  Enzymes negative. Discussed with patient and wife. Plan definitive cardiac cath this am.   [name], MD, [facility] 12/22/2011 8:56 AM

## 2011-12-22 NOTE — Consult Note (Signed)
Pt smokes 2 ppd and is very motivated to quit. Pt in action stage and needs help to quit. Recommended for pt to start with 42 mg patch. Discussed patch use instructions and how to taper gradually. Pt voices understanding. Referred to 1-800 quit now for f/u and support. Discussed oral fixation substitutes, second hand smoke and in home smoking policy. Reviewed and gave pt Written education/contact information.

## 2011-12-22 NOTE — Progress Notes (Signed)
UR Completed. Simmons, Renesmee Raine F [phone]  

## 2011-12-22 NOTE — Cardiovascular Report (Signed)
NAMEWALLACE, GAPPA NO.:  [id number]  MEDICAL RECORD NO.:  [id number]  LOCATION:  2505                         FACILITY:  [facility]  PHYSICIAN:  Nicki Guadalajara, M.D.     DATE OF BIRTH:  August 30, 1951  DATE OF PROCEDURE: DATE OF DISCHARGE:                           CARDIAC CATHETERIZATION   INDICATIONS:  [name] is a 61 year old gentleman who has a history of diabetes mellitus, hypertension, hyperlipidemia, and tobacco history.  The patient suffered an MI in 1995 and was found to have total RCA occlusion with collaterals treated medically.  He is status post multiple procedures to his proximal and mid left circumflex coronary artery and also has had restenosis with cutting balloon arthrotomy.  His studies have been in 2004, 2006, and last being in 2007.  The patient was admitted in transfer from [facility] last evening with nitrate responsive chest pain.  ECG does not show any acute ECG changes but symptoms were worrisome for unstable angina.  He now presents for cardiac catheterization.  PROCEDURE:  Cine coronary angiography; left ventriculography; distal aortography; percutaneous coronary intervention to the proximal left circumflex coronary artery with cutting balloon arthrotomy/PTCA.  PROCEDURE:  After premedication with Versed 2 mg plus fentanyl 50 mcg intravenously, the patient was prepped and draped in usual fashion.  His right femoral artery was punctured anteriorly and a 5-French sheath was inserted without difficulty.  Diagnostic cardiac catheterization was done utilizing 5-French Judkins 4 left and right coronary catheters.  A 5-French pigtail catheter was used for RAO ventriculography.  Distal aortography was performed to make certain he did not have any significant aortoiliac disease or renal artery stenosis.  With the demonstration of high-grade 85% in-stent restenosis in the proximal circumflex stented segment, decision was made to  proceed with percutaneous coronary intervention.  This was discussed in detail with the patient.  He received an additional 1 mg of Versed plus 25 mcg of fentanyl.  The 5-French sheath was upgraded to a 6-French system.  The 6- [place] XB 3.5 guide was used.  Bivalirudin bolus plus infusion was administered and ACT was documented to be therapeutic.  The patient apparently had a Plavix allergy and consequently 60 mg prasugrel was administered.  The patient was also premedicated for possible contrast allergy prior to procedure.  The patient was started on IV nitroglycerin and also received several doses of intracoronary nitroglycerin.  A Prowater wire was advanced down into the circumflex vessel.  Initially, a 3.5 x 15 mm cutting balloon arthrotomy catheter was attempted to be inserted but due to the angle of the circumflex, this was not able to reach the lesion and consequently was exchanged for a 3.5 x 10 mm cutting balloon.  Cutting balloon was then advanced to the proximal circumflex areas of stenosis.  Multiple dilatations were made in this proximally stented segment commencing at 2 atmospheres and ultimately dilating to 10 atmospheres throughout this region.  A 4.0 x 15 mm noncompliant Quantum balloon was then used with dilatation up to 4.04 mm in the proximal portion of the stent and 4.0 mm in the distal aspect of the stent.  [facility]out angiography confirmed an excellent angiographic result.  The patient tolerated the procedure well.  The arterial sheath was sutured in place with plans for sheath removal once the ACT is below 175.  HEMODYNAMIC DATA:  Central aortic pressure 130/73, left ventricle pressure 130/9.  ANGIOGRAPHIC DATA:  There was almost a common ostium, left main LAD circumflex system without any visualization of any significant left main.  The LAD was moderate-sized vessel.  After giving rise to the first prominent diagonal vessel, there was 40-50% narrowing in the  LAD followed by 30% narrowing before a prominent septal perforating artery. The LAD wrapped around the LV apex.  There also was collateralization to the PDA and PLA system of the right coronary artery.  The circumflex vessel is a large dominant vessel.  The entire proximal and mid segment was stented.  There was 85% in-stent restenosis in the proximal stented segment.  There was collaterals from this area supplying the distal right coronary artery, which were somewhat jeopardy due to the stenoses.  There was mild 20% narrowing in the distal stent at several segments.  The right coronary artery was totally occluded proximally and there was very faint antegrade collaterals but predominant left-to-right collaterals.  RAO ventriculography revealed an ejection fraction of 50-55%.  There was mid to basal inferior hypocontractility.  Next distal aortography revealed widely patent renal arteries without renal artery stenosis. There was no significant aortoiliac disease.  Following successful percutaneous coronary intervention to the proximal circumflex stented segment with cutting balloon arthrotomy and ultimate noncompliant balloon dilatation, the 85% stenosis was reduced to 0%. There was brisk TIMI-3 flow.  There was no evidence for dissection.  IMPRESSION: 1. Low normal left ventricular function with mild mid to basal     posterior hypocontractility. 2. Three-vessel coronary artery disease with 40-50% narrowing in the     left anterior descending artery after a prominent diagonal vessel     followed by 30% stenosis; diffuse proximal in-stent restenosis in     the proximal circumflex previously stented segment of 85% with 20%     narrowings in the distally placed mid AV groove circumflex stent. 3. Total occlusion of the proximal right coronary artery with     predominant left-to-right collaterals. 4. Successful percutaneous coronary intervention for in-stent     restenosis in the  previously placed proximal circumflex stent     utilizing cutting balloon arthrotomy (3.5 x 10 mm) and noncompliant     balloon dilatation up to 4.04 mm, tapering to 4.0 mm done with     Angiomax/60 mg oral prasugrel/IC and IV nitroglycerin in this     patient on aspirin therapy. 5. No significant aortoiliac disease.          ______________________________ Nicki Guadalajara, M.D.     TK/MEDQ  D:  12/22/2011  T:  12/22/2011  Job:  [id number]  cc:   Lonia Blood, M.D. Hilario Quarry, M.D.

## 2011-12-22 NOTE — Progress Notes (Signed)
Site area: right groin  Site Prior to Removal:  Level 0  Pressure Applied For 25 MINUTES    Minutes Beginning at 1330  Manual:   yes  Patient Status During Pull:  [id number]  Post Pull Groin Site:  Level 0  Post Pull Instructions Given:  yes  Post Pull Pulses Present:  yes  Dressing Applied:  yes  Comments:  Tolerated procedure well

## 2011-12-22 NOTE — CV Procedure (Signed)
CARDIAC CATHERIZATION/PERCUTANEOUS CORONARY INTERVENTION  Robert [name], 61 y.o., male  Full note dictated;  See diagram  DICTATION #  [id number], [id number]  Common ostium of LAD/LCx LAD: 40 - 50% post DX1 LCx: 85% instent restenosis in prox LCX stent;  20 % distal RCA: old occlusion with predominant L to R collaterals EF 50 - 55% with posterobasal hypokinesis No sig Aotoiliac disease  Successful cutting balloon atherotomy with 3.5x10 cutting balloon and 4.0 [place] Quantum; 85% to 0.  Tolerated well.  [name], MD, [facility] 12/22/2011 11:05 AM

## 2011-12-23 ENCOUNTER — Other Ambulatory Visit: Payer: Self-pay

## 2011-12-23 LAB — BASIC METABOLIC PANEL
CO2: 24 mEq/L (ref 19–32)
Calcium: 9.2 mg/dL (ref 8.4–10.5)
Chloride: 101 mEq/L (ref 96–112)
Glucose, Bld: 126 mg/dL — ABNORMAL HIGH (ref 70–99)
Potassium: 3.8 mEq/L (ref 3.5–5.1)
Sodium: 135 mEq/L (ref 135–145)

## 2011-12-23 LAB — CBC
Hemoglobin: 15.6 g/dL (ref 13.0–17.0)
MCH: 30.8 pg (ref 26.0–34.0)
Platelets: 186 10*3/uL (ref 150–400)
RBC: 5.07 MIL/uL (ref 4.22–5.81)
WBC: 12.4 10*3/uL — ABNORMAL HIGH (ref 4.0–10.5)

## 2011-12-23 LAB — GLUCOSE, CAPILLARY: Glucose-Capillary: 109 mg/dL — ABNORMAL HIGH (ref 70–99)

## 2011-12-23 MED ORDER — PRASUGREL HCL 10 MG PO TABS: 10.0000 mg | ORAL_TABLET | Freq: Every day | ORAL | Status: DC

## 2011-12-23 MED ORDER — NITROGLYCERIN 0.4 MG SL SUBL: 0.4000 mg | SUBLINGUAL_TABLET | SUBLINGUAL | Status: DC | PRN

## 2011-12-23 MED ORDER — SIMVASTATIN 20 MG PO TABS: 20.0000 mg | ORAL_TABLET | Freq: Every evening | ORAL | Status: DC

## 2011-12-23 MED ORDER — LORATADINE 10 MG PO TABS: 10.0000 mg | ORAL_TABLET | Freq: Every day | ORAL | Status: DC

## 2011-12-23 MED ORDER — ACETAMINOPHEN 650 MG RE SUPP: 650.0000 mg | Freq: Four times a day (QID) | RECTAL | Status: DC | PRN

## 2011-12-23 MED ORDER — ASPIRIN 81 MG PO CHEW: 81.0000 mg | CHEWABLE_TABLET | Freq: Every day | ORAL | Status: DC

## 2011-12-23 MED ORDER — METFORMIN HCL 1000 MG PO TABS: 1000.0000 mg | ORAL_TABLET | Freq: Two times a day (BID) | ORAL | Status: DC

## 2011-12-23 MED ORDER — ALPRAZOLAM 0.25 MG PO TABS: 0.2500 mg | ORAL_TABLET | Freq: Three times a day (TID) | ORAL | Status: AC | PRN

## 2011-12-23 NOTE — Progress Notes (Signed)
Subjective:  No chest pain, anxious  Objective:  Vital Signs in the last 24 hours: Temp:  [97.5 F (36.4 C)-98 F (36.7 C)] 97.8 F (36.6 C) (04/13 0755) Pulse Rate:  [73-105] 73  (04/13 0755) Resp:  [18-24] 18  (04/13 0443) BP: (120-137)/(74-90) 129/74 mmHg (04/13 0755) SpO2:  [94 %-99 %] 96 % (04/13 0755)  Intake/Output from previous day:  Intake/Output Summary (Last 24 hours) at 12/23/11 0800 Last data filed at 12/23/11 0447  Gross per 24 hour  Intake 1958.5 ml  Output   2000 ml  Net  -41.5 ml    Physical Exam: General appearance: alert, cooperative, no distress and  Lungs: clear to auscultation bilaterally Heart: regular rate and rhythm Rt groin without hematoma   Rate: 80  Rhythm: normal sinus rhythm  Lab Results:  Basename 12/23/11 0500 12/22/11 0645  WBC 12.4* 11.8*  HGB 15.6 17.0  PLT 186 192    Basename 12/23/11 0500 12/22/11 1330  NA 135 134*  K 3.8 4.3  CL 101 101  CO2 24 20  GLUCOSE 126* 186*  BUN 13 13  CREATININE 0.88 0.86    Basename 12/22/11 1330 12/22/11 0700  TROPONINI <0.30 <0.30   Hepatic Function Panel  Basename 12/22/11 0645  PROT 7.2  ALBUMIN 3.7  AST 19  ALT 23  ALKPHOS 86  BILITOT 0.3  BILIDIR --  IBILI --    Basename 12/22/11 0935  CHOL 146    Basename 12/21/11 1400  INR 0.94    Imaging: Imaging results have been reviewed  Cardiac Studies:  Assessment/Plan:   Principal Problem:  *Unstable angina Active Problems:  Type II IDDM  Tobacco abuse  CAD, MI '95, CFX stent '04, CFX DES 9/06, CFX DES 8/07 with OM POBA  Contrast media allergy  HTN (hypertension)  PVD, chronic LLE  Rheumatoid arthritis Cerumen impaction Lt ear Anxiety Plavix allergy   Plan- Pt not on a statin prior to admission because of cost. OK to discharge. Will add low dose Zocor with history of RA. F?U Dr Royann Shivers in [place] Melizza Kanode PA-C 12/23/2011, 8:00 AM

## 2011-12-23 NOTE — Progress Notes (Signed)
UA collected and EKG done as ordered. Pt slept well. No C/O Chest Pain.

## 2011-12-23 NOTE — Progress Notes (Signed)
    CARE MANAGEMENT NOTE 12/23/2011  Patient:  Robert [name], Robert [name]   Account Number:  [id number]  Date Initiated:  12/23/2011  Documentation initiated by:  [facility]  Subjective/Objective Assessment:   CAD, stent placement     Action/Plan:   lives at home with wife   Anticipated DC Date:  12/23/2011   Anticipated DC Plan:  HOME/SELF CARE      DC Planning Services  CM consult  Medication Assistance      Choice offered to / List presented to:             Status of service:  Completed, signed off Medicare Important Message given?   (If response is "NO", the following Medicare IM given date fields will be blank) Date Medicare IM given:   Date Additional Medicare IM given:    Discharge Disposition:  HOME/SELF CARE  Per UR Regulation:    If discussed at Long Length of Stay Meetings, dates discussed:    Comments:  12/23/2011 0915 Provided pt with 30 day free trial of Effient with medication info packet. Educated pt on importance of reviewing side effects of meds and report any side effects to his physician. Explained to pt he could not use Effient copay card due to Medicare coverage. Contacted pt's pharmacy [facility]  in [place]. They have Effient in stock. Isidoro Donning RN CCM Case Mgmt phone [phone]

## 2011-12-23 NOTE — Progress Notes (Signed)
Cardiac Rehab [phone] Pt had already walked 340 ft this am without CP. States this is as far as he can walk without legs hurting. Encouraged walking as tolerated but did not give ex ed. Pt's mobility limited by leg pain. Education completed with pt and wife. Encouraged smoking cessation. Denied CRP 2 referral due to leg pain. Pt stated he tried to do it in the past and could not tolerated.Syriana Croslin DunlapRN

## 2011-12-23 NOTE — Progress Notes (Signed)
Pt. Seen and examined. Agree with the NP/PA-C note as written. Will add statin to regimen. Plan follow-up with Dr. Royann Shivers. Ok for discharge today.  Chrystie Nose, MD, [facility] Attending Cardiologist [facility]

## 2011-12-23 NOTE — Discharge Instructions (Signed)
Angioplasty Angioplasty is a procedure to widen a narrow blood vessel. The procedure is usually done on the blood vessels of the heart (coronary arteries) but may help vessels to other parts of the body such as the legs. When a vessel in the heart becomes partially blocked there is decreased blood flow to that area. This may lead to chest pain or a heart attack (myocardial infarction).  Angioplasty may be done after a procedure that found a problem or as an emergency to treat a heart attack by opening the blocked arteries. The arteries are usually blocked by cholesterol buildup (plaque) in the lining or walls. LET YOUR CAREGIVER KNOW ABOUT:  Allergies.   Medicines taken, including herbs, eyedrops, over-the-counter medicines, and creams.   Use of steroids (by mouth or creams).   Previous problems with anesthetics or numbing medicines.   Possibility of pregnancy, if this applies.   History of blood clots (thrombophlebitis).   History of bleeding or blood problems.   Previous surgery.   Other health problems.  RISKS AND COMPLICATIONS  Damage to the artery.   A blockage may return.   Bleeding at the insertion site.   Blood clot to another part of the body.  BEFORE THE PROCEDURE  Let your caregiver know if you have had an allergy to dyes used in X-ray, or if you have ever had kidney problems or failure.   Do not eat or drink starting from midnight up to the time of the procedure, or as directed.   You may drink enough water to take your medicines the morning of the procedure if you were instructed to do so.   You should be at the hospital or outpatient facility where the procedure is to be done 60 minutes prior to the procedure or as directed.  PROCEDURE  You may be given a medicine to help you relax before and during the procedure through an intravenous (IV) access in your hand or arm.   Medicine that numbs the area (local anesthetic) may be used before inserting the long,  thin tube (catheter).   You will be prepared for the procedure by washing and shaving the area where the catheter will be inserted. This is usually done in the groin.   A catheter will be inserted into an artery using a guide wire. This is guided under a type of X-ray (fluoroscopy) to the opening of the blocked artery.   Dye is then injected and X-rays are taken.   Once positioned at the narrowed portion of the blood vessel, the balloon is inflated to make the artery wider. Expanding the balloon crushes the plaque into the wall of the vessel and improves the blood flow.   Sometimes the artery may be made wider using a laser or other tools to remove plaque.   When the blood flow is better, the balloon is deflated and the catheter is removed.  AFTER THE PROCEDURE  You will stay in bed for several hours.   The access site will be watched and you will be checked frequently.   Blood tests, X-rays, and an electrocardiogram (EKG) may be done.   You may stay in the hospital overnight for observation.  Document Released: 08/25/2000 Document Revised: 08/17/2011 Document Reviewed: 12/19/2007 [employer] Patient Information 2012 [employer]. 

## 2011-12-25 ENCOUNTER — Emergency Department (HOSPITAL_COMMUNITY): Payer: Medicare Other

## 2011-12-25 ENCOUNTER — Emergency Department (HOSPITAL_COMMUNITY)
Admission: EM | Admit: 2011-12-25 | Discharge: 2011-12-25 | Disposition: A | Discharge status: Home / Self Care | Payer: Medicare Other | Attending: Cardiovascular Disease | Admitting: Cardiovascular Disease

## 2011-12-25 ENCOUNTER — Encounter (HOSPITAL_COMMUNITY): Payer: Self-pay | Admitting: Physical Medicine and Rehabilitation

## 2011-12-25 DIAGNOSIS — Z794 Long term (current) use of insulin: Secondary | ICD-10-CM | POA: Insufficient documentation

## 2011-12-25 DIAGNOSIS — R0602 Shortness of breath: Secondary | ICD-10-CM | POA: Insufficient documentation

## 2011-12-25 DIAGNOSIS — E119 Type 2 diabetes mellitus without complications: Secondary | ICD-10-CM | POA: Insufficient documentation

## 2011-12-25 DIAGNOSIS — R079 Chest pain, unspecified: Secondary | ICD-10-CM | POA: Insufficient documentation

## 2011-12-25 DIAGNOSIS — I10 Essential (primary) hypertension: Secondary | ICD-10-CM | POA: Insufficient documentation

## 2011-12-25 DIAGNOSIS — I2510 Atherosclerotic heart disease of native coronary artery without angina pectoris: Secondary | ICD-10-CM | POA: Insufficient documentation

## 2011-12-25 LAB — BASIC METABOLIC PANEL
BUN: 14 mg/dL (ref 6–23)
Creatinine, Ser: 0.96 mg/dL (ref 0.50–1.35)
GFR calc Af Amer: >90 mL/min (ref >90)
GFR calc non Af Amer: 90 mL/min — ABNORMAL LOW (ref >90)
Glucose, Bld: 209 mg/dL — ABNORMAL HIGH (ref 70–99)
Potassium: 4.7 mEq/L (ref 3.5–5.1)

## 2011-12-25 LAB — TROPONIN I: Troponin I: <0.3 ng/mL (ref <0.30)

## 2011-12-25 LAB — CBC
HCT: 48 % (ref 39.0–52.0)
Hemoglobin: 16 g/dL (ref 13.0–17.0)
MCH: 29.9 pg (ref 26.0–34.0)
MCHC: 33.3 g/dL (ref 30.0–36.0)
MCV: 89.6 fL (ref 78.0–100.0)
RDW: 15.9 % — ABNORMAL HIGH (ref 11.5–15.5)

## 2011-12-25 MED ORDER — GI COCKTAIL ~~LOC~~
30.0000 mL | Freq: Once | ORAL | Status: AC
  Administered 2011-12-25: 30 mL via ORAL
  Filled 2011-12-25: qty 30

## 2011-12-25 MED ORDER — METOPROLOL TARTRATE 25 MG PO TABS: 75.0000 mg | ORAL_TABLET | Freq: Two times a day (BID) | ORAL | Status: DC

## 2011-12-25 MED ORDER — ISOSORBIDE MONONITRATE ER 30 MG PO TB24
30.0000 mg | ORAL_TABLET | Freq: Once | ORAL | Status: DC
  Filled 2011-12-25: qty 1

## 2011-12-25 MED ORDER — SODIUM CHLORIDE 0.9 % IV SOLN
INTRAVENOUS | Status: DC
  Administered 2011-12-25: 100 mL/h via INTRAVENOUS

## 2011-12-25 MED ORDER — ISOSORBIDE MONONITRATE ER 30 MG PO TB24: 30.0000 mg | ORAL_TABLET | Freq: Every day | ORAL | Status: DC

## 2011-12-25 MED ORDER — NITROGLYCERIN 0.4 MG SL SUBL
0.4000 mg | SUBLINGUAL_TABLET | SUBLINGUAL | Status: DC | PRN
  Administered 2011-12-25: 0.4 mg via SUBLINGUAL
  Filled 2011-12-25: qty 25

## 2011-12-25 MED FILL — Dextrose Inj 5%: INTRAVENOUS | Qty: 50 | Status: AC

## 2011-12-25 NOTE — ED Notes (Signed)
Pt presents to department for evaluation of non radiating chest pain. Onset this morning. Also states SOB and fatigue. Pt states 2/10 pain at the time, relieved by nitroglycerin. States recent cardiac cath on Saturday that showed blockage. He is alert and oriented x4. Skin warm and dry.

## 2011-12-25 NOTE — ED Notes (Signed)
Cardiology is at the bedside to examine the patient

## 2011-12-25 NOTE — ED Notes (Signed)
Spoke to Boyd from Cardiology and informed RN that he wrote a prescription for Isosorbide and Metoprolol for patient to fill when he gets discharged for when he goes home and that he did not need to have in the hospital

## 2011-12-25 NOTE — Discharge Summary (Signed)
Patient ID: Robert [name],  MRN: [id number], DOB/AGE: 59-Apr-1952 59 y.o.  Admit date: 12/21/2011 Discharge date: 12/25/2011  Primary Care Provider: Dr Sherwood Gambler Primary Cardiologist: Dr Royann Shivers  Discharge Diagnoses Principal Problem:  *Unstable angina Active Problems:  Type II IDDM  Tobacco abuse  CAD, MI '95, CFX stent '04, CFX DES 9/06, CFX DES 8/07 with OM POBA  Contrast media allergy  HTN (hypertension)  PVD, chronic LLE  Rheumatoid arthritis    Procedures: Cath/PCI ISR LCFX on 12/22/11   Hospital Course This is a 61 y.o. male with a past medical history significant for CAD. He had an MI in 1995 and was found to have a total RCA treated medically. He had an intervention in 2004 to the CFX. In 2006 he had a 2 site CFX DES. His last intervention was Sept 2007 with CFX DES and OM POBA. He has done well from a cardiac standpoint since. He is followed in [place] by Dr Sherwood Gambler and in our [facility] On the day prior to admission he developed discomfort across his upper chest which he initially thought was indigestion. His usual angina is arm pain. When his symptoms did not improve after antacid he went to [facility]. In th ER his symptoms resolved with NTG. He was transferred to [facility] for further evaluation. He was pain free on Heparin and NTG and Troponin was negative X 2. He does have a true contrast allergy and a Plavix allergy. Cath and PCI done 12/22/11 by Dr Tresa Endo, see his note for details. 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  Troponin remained negative. He did have some problems with ear pain on Lt which we felt was secondary to cerumen impaction. He will follow up with Dr Royann Shivers in [place] and will contact his primary care doctor about his ear. Medication cost is an issue for him, he was not on a statin prior to admission because he "couldn't afford one". We added low dose Zocor at discharge, he also has recently been diagnosed with Rheumatoid arthritis.   Discharge Vitals:  Blood  pressure 129/74, pulse 73, temperature 97.8 F (36.6 C), temperature source Oral, resp. rate 18, height 6' (1.829 m), weight 107.366 kg (236 lb 11.2 oz), SpO2 96.00%.    Labs: No results found for this or any previous visit (from the past 48 hour(s)).  Disposition:  Follow-up Information    Follow up with Cassell Smiles., MD. Call in 2 weeks.      Follow up with Thurmon Fair, MD. (office will call)    Contact information:   [address] [place] [address] [phone]          Discharge Medications:  Medication List  As of 12/25/2011  9:39 AM   STOP taking these medications         aspirin EC 325 MG tablet         TAKE these medications         acetaminophen 650 MG suppository   Commonly known as: TYLENOL   Place 1 suppository (650 mg total) rectally every 6 (six) hours as needed (or Fever >/= 101).      ALPRAZolam 0.25 MG tablet   Commonly known as: XANAX   Take 1 tablet (0.25 mg total) by mouth 3 (three) times daily as needed for anxiety.      aspirin 81 MG chewable tablet   Chew 1 tablet (81 mg total) by mouth daily.      cilostazol 100 MG  tablet   Commonly known as: PLETAL   Take 100 mg by mouth 2 (two) times daily.      fish oil-omega-3 fatty acids 1000 MG capsule   Take 1 g by mouth 2 (two) times daily.      folic acid 1 MG tablet   Commonly known as: FOLVITE   Take 1 mg by mouth at bedtime.      glipiZIDE 10 MG 24 hr tablet   Commonly known as: GLUCOTROL XL   Take 10 mg by mouth 2 (two) times daily.      hydroxychloroquine 200 MG tablet   Commonly known as: PLAQUENIL   Take 200 mg by mouth 2 (two) times daily.      insulin glargine 100 UNIT/ML injection   Commonly known as: LANTUS   Inject 20-40 Units into the skin at bedtime. As directed per sliding scale      loratadine 10 MG tablet   Commonly known as: CLARITIN   Take 1 tablet (10 mg total) by mouth daily.      meloxicam 15 MG tablet   Commonly known as:  MOBIC   Take 15 mg by mouth at bedtime.      metFORMIN 1000 MG tablet   Commonly known as: GLUCOPHAGE   Take 1 tablet (1,000 mg total) by mouth 2 (two) times daily with a meal.      methotrexate 2.5 MG tablet   Commonly known as: RHEUMATREX   Take 25 mg by mouth once a week. Caution:Chemotherapy. Protect from light. ON THURSDAYS      metoprolol 50 MG tablet   Commonly known as: LOPRESSOR   Take 50 mg by mouth 2 (two) times daily.      nitroGLYCERIN 0.4 MG SL tablet   Commonly known as: NITROSTAT   Place 1 tablet (0.4 mg total) under the tongue every 5 (five) minutes as needed.      prasugrel 10 MG Tabs   Commonly known as: EFFIENT   Take 1 tablet (10 mg total) by mouth daily.      quinapril 20 MG tablet   Commonly known as: ACCUPRIL   Take 20 mg by mouth at bedtime.      simvastatin 20 MG tablet   Commonly known as: ZOCOR   Take 1 tablet (20 mg total) by mouth every evening.            Outstanding Labs/Studies  Duration of Discharge Encounter: Greater than 30 minutes including physician time.  Jolene Provost PA-C 12/25/2011 9:39 AM

## 2011-12-25 NOTE — ED Provider Notes (Signed)
History     CSN: [id number]  Arrival date & time 12/25/11  1454   First MD Initiated Contact with Patient 12/25/11 1636      Chief Complaint  Patient presents with  . Chest Pain  . Shortness of Breath     HPI Pt was seen at 1735.  Per pt, c/o gradual onset and persistence of waxing and waning chest "pain" since approx 1300 today.  Pt states the CP is located across his chest, has been assoc with SOB and generalized fatigue, and began while walking.  Pt states he took his own sl ntg x1 with "mostly" relief of discomfort.  Pt describes this CP as different from his usual anginal pain which is usually a "burning" on the right side of his chest wall.  Endorses he was discharged on 12/21/11 for CP, s/p cath with PTCA for in-stent restenosis.  States he has been taking his ASA and Effient as prescribed, as took his doses for today.  Denies palpitations, no back pain, no N/V/D, no abd pain, no cough, no fevers.     Past Medical History  Diagnosis Date  . Rheumatoid arthritis   . MI (myocardial infarction)   . Diabetes mellitus   . Hypertension   . Coronary artery disease   . Dysrhythmia   . CHF (congestive heart failure)   . Shortness of breath   . Unstable angina 12/22/2011  . PVD, chronic LLE 12/22/2011    Past Surgical History  Procedure Date  . Coronary stent placement   . Back surgery   . Cholecystectomy   . Abdominal surgery   . Cardiac surgery   . Coronary angioplasty     Family History  Problem Relation Age of Onset  . Colon cancer Father     History  Substance Use Topics  . Smoking status: Current Everyday Smoker -- 2.0 packs/day for 25 years    Types: Cigarettes  . Smokeless tobacco: Never Used  . Alcohol Use: No    Review of Systems ROS: Statement: All systems negative except as marked or noted in the HPI; Constitutional: Negative for fever and chills. ; ; Eyes: Negative for eye pain, redness and discharge. ; ; ENMT: Negative for ear pain, hoarseness, nasal  congestion, sinus pressure and sore throat. ; ; Cardiovascular: +CP, SOB. Negative for  palpitations, diaphoresis, and peripheral edema. ; ; Respiratory: Negative for cough, wheezing and stridor. ; ; Gastrointestinal: Negative for nausea, vomiting, diarrhea, abdominal pain, blood in stool, hematemesis, jaundice and rectal bleeding. . ; ; Genitourinary: Negative for dysuria, flank pain and hematuria. ; ; Musculoskeletal: Negative for back pain and neck pain. Negative for swelling and trauma.; ; Skin: Negative for pruritus, rash, abrasions, blisters, bruising and skin lesion.; ; Neuro: Negative for headache, lightheadedness and neck stiffness. Negative for weakness, altered level of consciousness , altered mental status, extremity weakness, paresthesias, involuntary movement, seizure and syncope.       Allergies  Ivp dye and Plavix  Home Medications   Current Outpatient Rx  Name Route Sig Dispense Refill  . ALPRAZOLAM 0.25 MG PO TABS Oral Take 1 tablet (0.25 mg total) by mouth 3 (three) times daily as needed for anxiety. 30 tablet 0  . ASPIRIN 81 MG PO CHEW Oral Chew 1 tablet (81 mg total) by mouth daily.    [name] CILOSTAZOL 100 MG PO TABS Oral Take 100 mg by mouth 2 (two) times daily.      . OMEGA-3 FATTY ACIDS 1000 MG PO CAPS  Oral Take 1 g by mouth 2 (two) times daily.    [name] FOLIC ACID 1 MG PO TABS Oral Take 1 mg by mouth at bedtime.     [name] GLIPIZIDE ER 10 MG PO TB24 Oral Take 10 mg by mouth 2 (two) times daily.    [name] HYDROXYCHLOROQUINE SULFATE 200 MG PO TABS Oral Take 200 mg by mouth 2 (two) times daily.    . INSULIN GLARGINE 100 UNIT/ML [place] SOLN Subcutaneous Inject 20-40 Units into the skin at bedtime. As directed per sliding scale    . LORATADINE 10 MG PO TABS Oral Take 10 mg by mouth daily as needed. For allergies.    . MELOXICAM 7.5 MG PO TABS Oral Take 7.5 mg by mouth daily.    [name] METFORMIN HCL 1000 MG PO TABS Oral Take 1 tablet (1,000 mg total) by mouth 2 (two) times daily with a meal.      HOLD  TILL Monday 4/15  . METHOTREXATE 2.5 MG PO TABS Oral Take 25 mg by mouth once a week. Caution:Chemotherapy. Protect from light. ON THURSDAYS    . METOPROLOL TARTRATE 50 MG PO TABS Oral Take 50 mg by mouth 2 (two) times daily.      [name] NITROGLYCERIN 0.4 MG SL SUBL Sublingual Place 1 tablet (0.4 mg total) under the tongue every 5 (five) minutes as needed. 25 tablet 2  . PRASUGREL HCL 10 MG PO TABS Oral Take 1 tablet (10 mg total) by mouth daily. 30 tablet 5  . QUINAPRIL HCL 20 MG PO TABS Oral Take 20 mg by mouth at bedtime.      [name] SIMVASTATIN 20 MG PO TABS Oral Take 1 tablet (20 mg total) by mouth every evening. 30 tablet 11    BP 127/79  Pulse 83  Temp(Src) 98 F (36.7 C) (Oral)  Resp 20  SpO2 95%  Physical Exam 1740: Physical examination:  Nursing notes reviewed; Vital signs and O2 SAT reviewed;  Constitutional: Well developed, Well nourished, Well hydrated, In no acute distress; Head:  Normocephalic, atraumatic; Eyes: EOMI, PERRL, No scleral icterus; ENMT: Mouth and pharynx normal, Mucous membranes moist; Neck: Supple, Full range of motion, No lymphadenopathy; Cardiovascular: Regular rate and rhythm, No murmur, rub, or gallop; Respiratory: Breath sounds clear & equal bilaterally, No rales, rhonchi, wheezes, or rub, Normal respiratory effort/excursion; Chest: Nontender, Movement normal; Abdomen: Soft, Nontender, Nondistended, Normal bowel sounds; Extremities: Pulses normal, No tenderness, No edema, No calf edema or asymmetry.; Neuro: AA&Ox3, Major CN grossly intact.  No gross focal motor or sensory deficits in extremities.; Skin: Color normal, Warm, Dry   ED Course  Procedures    MDM  MDM Reviewed: previous chart, nursing note and vitals Reviewed previous: ECG Interpretation: ECG, labs and x-ray      Date: 12/25/2011  Rate: 93  Rhythm: normal sinus rhythm  QRS Axis: normal  Intervals: normal  ST/T Wave abnormalities: normal  Conduction Disutrbances:none  Narrative  Interpretation:   Old EKG Reviewed: unchanged; no significant changes from previous EKG dated 12/23/2011.   Results for orders placed during the hospital encounter of 12/25/11  CBC      Component Value Range   WBC 9.7  4.0 - 10.5 (K/uL)   RBC 5.36  4.22 - 5.81 (MIL/uL)   Hemoglobin 16.0  13.0 - 17.0 (g/dL)   HCT 11.9  14.7 - 82.9 (%)   MCV 89.6  78.0 - 100.0 (fL)   MCH 29.9  26.0 - 34.0 (pg)   MCHC 33.3  30.0 - 36.0 (g/dL)   RDW 16.1 (*) 09.6 - 15.5 (%)   Platelets 189  150 - 400 (K/uL)  BASIC METABOLIC PANEL      Component Value Range   Sodium 138  135 - 145 (mEq/L)   Potassium 4.7  3.5 - 5.1 (mEq/L)   Chloride 103  96 - 112 (mEq/L)   CO2 26  19 - 32 (mEq/L)   Glucose, Bld 209 (*) 70 - 99 (mg/dL)   BUN 14  6 - 23 (mg/dL)   Creatinine, Ser 0.45  0.50 - 1.35 (mg/dL)   Calcium 40.9  8.4 - 10.5 (mg/dL)   GFR calc non Af Amer 90 (*) >90 (mL/min)   GFR calc Af Amer >90  >90 (mL/min)  POCT I-STAT TROPONIN I      Component Value Range   Troponin i, poc 0.00  0.00 - 0.08 (ng/mL)   Comment 3             Dg Chest 2 View 12/25/2011  *RADIOLOGY REPORT*  Clinical Data: Chest pain  CHEST - 2 VIEW  Comparison: 12/21/2011  Findings: Cardiomediastinal silhouette is stable.  No acute infiltrate or pleural effusion.  No pulmonary edema.  Stable central mild bronchitic changes.  Bilateral basilar scarring.  IMPRESSION: No acute infiltrate or pulmonary edema.  Central mild bronchitic changes again noted.  Original Report Authenticated By: Natasha Mead, M.D.     6:07 PM:  T/C to [facility] MD, case discussed, including:  HPI, pertinent PM/SHx, VS/PE, dx testing, ED course and treatment:  Agreeable to eval in ED for possible admit.    7:34 PM:  [facility] Dr. Tresa Endo as decided to discharge pt to f/u in ofc.  Pt and family are agreeable with this.  Cards service will d/c.      Laray Anger, DO 12/26/11 1143

## 2011-12-25 NOTE — Discharge Instructions (Signed)
Take the prescriptions given to you by your Cardiologist as directed.  Call your regular Cardiologist tomorrow to schedule a follow up appointment within the next 2 days.  Return to the Emergency Department immediately sooner if worsening.

## 2011-12-25 NOTE — H&P (Signed)
Robert [name] is an 61 y.o. male.   Chief Complaint: chest pain HPI:  This is a 61 y.o. male with a past medical history significant for CAD. Patient was discharged on Saturday, 12/23/2011.  He had an MI in 1995 and was found to have a total RCA treated medically. He had an intervention in 2004 to the CFX. In 2006 he had a 2 site CFX DES. His last intervention was Sept 2007 with CFX DES and OM POBA. He has done well from a cardiac standpoint since. He is followed in [place] by Dr Sherwood Gambler and in our [facility]. On the day prior to admission he developed discomfort across his upper chest which he initially thought was indigestion. His usual angina is arm pain. When his symptoms did not improve after antacid he went to [facility] ER. In th ER his symptoms resolved with NTG. He was transferred to [facility] for further evaluation. He was pain free on Heparin and NTG and Troponin was negative X 2. He does have a true contrast allergy and a Plavix allergy. Cath and PCI done 12/22/11 by Dr Tresa Endo, see his note for details. He had cutting balloon for ISR CFX. Troponin remained negative. He did have some problems with ear pain on Lt which we felt was secondary to cerumen impaction. He will follow up with Dr Royann Shivers in [place] and will contact his primary care doctor about his ear. Medication cost is an issue for him, he was not on a statin prior to admission because he "couldn't afford one". We added low dose Zocor at discharge, he also has recently been diagnosed with Rheumatoid arthritis.  He presents today after developing 3/10 chest pain this morning which was similar to his pain last week at which time he required cutting balloon arthrotomy.   Patient does also complain of some shortness of breath which is isn't much greater than his normal baseline.  His history also includes tobacco use.  Patient states that he took 2 nitroglycerin and after about a 5-10 minutes he had some relief. Currently in the ED the chest pain  was one out of 10 in intensity he was given another nitroglycerin which provided no relief. He states it does feel like a gas. He denies orthopnea paroxysmal nocturnal dyspnea dizziness radiation of pain cough congestion nausea vomiting abdominal pain dysuria hematuria hematochezia melena lower extremity edema sore throat.  The EKG shows no acute changes. Normal sinus rhythm.  Troponin POC is a 0.00.   Past Medical History  Diagnosis Date  . Rheumatoid arthritis   . MI (myocardial infarction)   . Diabetes mellitus   . Hypertension   . Coronary artery disease   . Dysrhythmia   . CHF (congestive heart failure)   . Shortness of breath   . Unstable angina 12/22/2011  . PVD, chronic LLE 12/22/2011    Past Surgical History  Procedure Date  . Coronary stent placement   . Back surgery   . Cholecystectomy   . Abdominal surgery   . Cardiac surgery   . Coronary angioplasty     Family History  Problem Relation Age of Onset  . Colon cancer Father    Social History:  reports that he has been smoking Cigarettes.  He has a 50 pack-year smoking history. He has never used smokeless tobacco. He reports that he does not drink alcohol or use illicit drugs.  Allergies:  Allergies  Allergen Reactions  . Ivp Dye (Iodinated Diagnostic Agents) Hives and Itching  .  Plavix (Clopidogrel Bisulfate) Rash    Medications Prior to Admission  Medication Dose Route Frequency Provider Last Rate Last Dose  . 0.9 %  sodium chloride infusion   Intravenous Continuous Laray Anger, DO 100 mL/hr at 12/25/11 1740 100 mL/hr at 12/25/11 1740  . gi cocktail (Maalox,Lidocaine,Donnatal)  30 mL Oral Once Dwana Melena, PA      . nitroGLYCERIN (NITROSTAT) SL tablet 0.4 mg  0.4 mg Sublingual Q5 min PRN Laray Anger, DO   0.4 mg at 12/25/11 1745   Medications Prior to Admission  Medication Sig Dispense Refill  . ALPRAZolam (XANAX) 0.25 MG tablet Take 1 tablet (0.25 mg total) by mouth 3 (three) times daily as  needed for anxiety.  30 tablet  0  . aspirin 81 MG chewable tablet Chew 1 tablet (81 mg total) by mouth daily.      . cilostazol (PLETAL) 100 MG tablet Take 100 mg by mouth 2 (two) times daily.        . fish oil-omega-3 fatty acids 1000 MG capsule Take 1 g by mouth 2 (two) times daily.      . folic acid (FOLVITE) 1 MG tablet Take 1 mg by mouth at bedtime.       [name] glipiZIDE (GLUCOTROL XL) 10 MG 24 hr tablet Take 10 mg by mouth 2 (two) times daily.      . hydroxychloroquine (PLAQUENIL) 200 MG tablet Take 200 mg by mouth 2 (two) times daily.      . insulin glargine (LANTUS) 100 UNIT/ML injection Inject 20-40 Units into the skin at bedtime. As directed per sliding scale      . loratadine (CLARITIN) 10 MG tablet Take 10 mg by mouth daily as needed. For allergies.      . metFORMIN (GLUCOPHAGE) 1000 MG tablet Take 1 tablet (1,000 mg total) by mouth 2 (two) times daily with a meal.      . methotrexate (RHEUMATREX) 2.5 MG tablet Take 25 mg by mouth once a week. Caution:Chemotherapy. Protect from light. ON THURSDAYS      . metoprolol (LOPRESSOR) 50 MG tablet Take 50 mg by mouth 2 (two) times daily.        . nitroGLYCERIN (NITROSTAT) 0.4 MG SL tablet Place 1 tablet (0.4 mg total) under the tongue every 5 (five) minutes as needed.  25 tablet  2  . prasugrel (EFFIENT) 10 MG TABS Take 1 tablet (10 mg total) by mouth daily.  30 tablet  5  . quinapril (ACCUPRIL) 20 MG tablet Take 20 mg by mouth at bedtime.        . simvastatin (ZOCOR) 20 MG tablet Take 1 tablet (20 mg total) by mouth every evening.  30 tablet  11  . DISCONTD: loratadine (CLARITIN) 10 MG tablet Take 1 tablet (10 mg total) by mouth daily.        Results for orders placed during the hospital encounter of 12/25/11 (from the past 48 hour(s))  CBC     Status: Abnormal   Collection Time   12/25/11  4:53 PM      Component Value Range Comment   WBC 9.7  4.0 - 10.5 (K/uL)    RBC 5.36  4.22 - 5.81 (MIL/uL)    Hemoglobin 16.0  13.0 - 17.0 (g/dL)     HCT 69.6  29.5 - 28.4 (%)    MCV 89.6  78.0 - 100.0 (fL)    MCH 29.9  26.0 - 34.0 (pg)    MCHC 33.3  30.0 - 36.0 (g/dL)    RDW 40.9 (*) 81.1 - 15.5 (%)    Platelets 189  150 - 400 (K/uL)   BASIC METABOLIC PANEL     Status: Abnormal   Collection Time   12/25/11  4:53 PM      Component Value Range Comment   Sodium 138  135 - 145 (mEq/L)    Potassium 4.7  3.5 - 5.1 (mEq/L)    Chloride 103  96 - 112 (mEq/L)    CO2 26  19 - 32 (mEq/L)    Glucose, Bld 209 (*) 70 - 99 (mg/dL)    BUN 14  6 - 23 (mg/dL)    Creatinine, Ser 9.14  0.50 - 1.35 (mg/dL)    Calcium 78.2  8.4 - 10.5 (mg/dL)    GFR calc non Af Amer 90 (*) >90 (mL/min)    GFR calc Af Amer >90  >90 (mL/min)   POCT I-STAT TROPONIN I     Status: Normal   Collection Time   12/25/11  5:09 PM      Component Value Range Comment   Troponin i, poc 0.00  0.00 - 0.08 (ng/mL)    Comment 3             Dg Chest 2 View  12/25/2011  *RADIOLOGY REPORT*  Clinical Data: Chest pain  CHEST - 2 VIEW  Comparison: 12/21/2011  Findings: Cardiomediastinal silhouette is stable.  No acute infiltrate or pleural effusion.  No pulmonary edema.  Stable central mild bronchitic changes.  Bilateral basilar scarring.  IMPRESSION: No acute infiltrate or pulmonary edema.  Central mild bronchitic changes again noted.  Original Report Authenticated By: Natasha Mead, M.D.    Review of Systems  Constitutional: Negative for fever, chills and diaphoresis.  HENT: Negative for congestion, sore throat and neck pain.   Eyes: Negative for blurred vision and double vision.  Respiratory: Positive for shortness of breath. Negative for cough and hemoptysis.   Cardiovascular: Positive for chest pain and claudication (left leg). Negative for palpitations, orthopnea, leg swelling and PND.  Gastrointestinal: Negative for nausea, vomiting, abdominal pain, diarrhea, constipation, blood in stool and melena.  Genitourinary: Negative for dysuria and hematuria.  Neurological: Negative for  dizziness and headaches.    Blood pressure 127/79, pulse 83, temperature 98 F (36.7 C), temperature source Oral, resp. rate 20, SpO2 95.00%. Physical Exam  Constitutional: He is oriented to person, place, and time. No distress.       obese  HENT:  Head: Normocephalic and atraumatic.  Eyes: EOM are normal. Pupils are equal, round, and reactive to light. No scleral icterus.  Neck: Normal range of motion. Neck supple. No JVD present.  Cardiovascular: Normal rate and regular rhythm.  Exam reveals no friction rub.   No murmur heard. Respiratory: Effort normal and breath sounds normal. He has no wheezes. He has no rales. He exhibits no tenderness.  GI: Soft. Bowel sounds are normal. There is no tenderness.  Musculoskeletal: He exhibits no edema.  Lymphadenopathy:    He has no cervical adenopathy.  Neurological: He is alert and oriented to person, place, and time.  Skin: Skin is warm and dry.  Psychiatric: He has a normal mood and affect. Judgment normal.     Assessment/Plan Chest pain Coronary artery disease Tobacco use Hyperlipidemia  Plan: Given the patient's recent intervention last week and negative cardiac enzymes and EKG without acute changes, he'll be discharged home with increasing beta blocker and the addition of long-acting nitroglycerin. He  was also given a GI cocktail.   He already has followup scheduled for May .  HAGER,BRYAN W 12/25/2011, 6:25 PM   Patient seen and examined. Agree with assessment and plan.  Pt is S/P PCI to proximal LCX last week for in-stent restenosis, treated with 3.5 mm cutting balloon atherotomy and 4.0 [place] balloon dilatation. He has a chronically occluded RCA with collaterals.  He has been very anxious in attempting to reduce tobacco. POC markers are negative, ECG unchanged.  Currently pain free.  Will send home but add Imdur 30 mg and increase Lopressor to 75 mg bid for more optimal beta blockade.   [name], MD, [facility] 12/25/2011 6:38  PM

## 2012-02-28 ENCOUNTER — Other Ambulatory Visit (HOSPITAL_COMMUNITY): Payer: Self-pay | Admitting: Internal Medicine

## 2012-03-26 ENCOUNTER — Encounter: Payer: Self-pay | Admitting: Internal Medicine

## 2012-05-10 ENCOUNTER — Emergency Department (HOSPITAL_COMMUNITY): Payer: Medicare Other

## 2012-05-10 ENCOUNTER — Emergency Department (HOSPITAL_COMMUNITY)
Admission: EM | Admit: 2012-05-10 | Discharge: 2012-05-10 | Disposition: A | Discharge status: Home / Self Care | Payer: Medicare Other | Attending: Emergency Medicine | Admitting: Emergency Medicine

## 2012-05-10 ENCOUNTER — Encounter (HOSPITAL_COMMUNITY): Payer: Self-pay | Admitting: Family Medicine

## 2012-05-10 ENCOUNTER — Other Ambulatory Visit: Payer: Self-pay

## 2012-05-10 DIAGNOSIS — R0602 Shortness of breath: Secondary | ICD-10-CM | POA: Insufficient documentation

## 2012-05-10 DIAGNOSIS — I739 Peripheral vascular disease, unspecified: Secondary | ICD-10-CM | POA: Diagnosis present

## 2012-05-10 DIAGNOSIS — E119 Type 2 diabetes mellitus without complications: Secondary | ICD-10-CM | POA: Insufficient documentation

## 2012-05-10 DIAGNOSIS — I252 Old myocardial infarction: Secondary | ICD-10-CM | POA: Insufficient documentation

## 2012-05-10 DIAGNOSIS — Z79899 Other long term (current) drug therapy: Secondary | ICD-10-CM | POA: Insufficient documentation

## 2012-05-10 DIAGNOSIS — Z91041 Radiographic dye allergy status: Secondary | ICD-10-CM | POA: Diagnosis present

## 2012-05-10 DIAGNOSIS — R209 Unspecified disturbances of skin sensation: Secondary | ICD-10-CM | POA: Insufficient documentation

## 2012-05-10 DIAGNOSIS — I2510 Atherosclerotic heart disease of native coronary artery without angina pectoris: Secondary | ICD-10-CM | POA: Insufficient documentation

## 2012-05-10 DIAGNOSIS — M069 Rheumatoid arthritis, unspecified: Secondary | ICD-10-CM | POA: Insufficient documentation

## 2012-05-10 DIAGNOSIS — I509 Heart failure, unspecified: Secondary | ICD-10-CM | POA: Insufficient documentation

## 2012-05-10 DIAGNOSIS — Z794 Long term (current) use of insulin: Secondary | ICD-10-CM | POA: Insufficient documentation

## 2012-05-10 DIAGNOSIS — I10 Essential (primary) hypertension: Secondary | ICD-10-CM | POA: Insufficient documentation

## 2012-05-10 DIAGNOSIS — Z720 Tobacco use: Secondary | ICD-10-CM | POA: Diagnosis present

## 2012-05-10 DIAGNOSIS — R079 Chest pain, unspecified: Secondary | ICD-10-CM | POA: Insufficient documentation

## 2012-05-10 DIAGNOSIS — F17200 Nicotine dependence, unspecified, uncomplicated: Secondary | ICD-10-CM | POA: Insufficient documentation

## 2012-05-10 LAB — CBC WITH DIFFERENTIAL/PLATELET
Basophils Absolute: 0 10*3/uL (ref 0.0–0.1)
Basophils Relative: 0 % (ref 0–1)
Eosinophils Absolute: 0.1 10*3/uL (ref 0.0–0.7)
Eosinophils Relative: 1 % (ref 0–5)
HCT: 49 % (ref 39.0–52.0)
MCH: 31.9 pg (ref 26.0–34.0)
MCHC: 35.3 g/dL (ref 30.0–36.0)
Monocytes Absolute: 0.6 10*3/uL (ref 0.1–1.0)
Neutro Abs: 9.9 10*3/uL — ABNORMAL HIGH (ref 1.7–7.7)
RDW: 15.3 % (ref 11.5–15.5)

## 2012-05-10 LAB — APTT: aPTT: 28 seconds (ref 24–37)

## 2012-05-10 LAB — COMPREHENSIVE METABOLIC PANEL
AST: 19 U/L (ref 0–37)
Albumin: 3.6 g/dL (ref 3.5–5.2)
Calcium: 9.9 mg/dL (ref 8.4–10.5)
Chloride: 101 mEq/L (ref 96–112)
Creatinine, Ser: 0.93 mg/dL (ref 0.50–1.35)
Total Protein: 7.4 g/dL (ref 6.0–8.3)

## 2012-05-10 LAB — TROPONIN I: Troponin I: <0.3 ng/mL (ref <0.30)

## 2012-05-10 LAB — PROTIME-INR
INR: 0.97 (ref 0.00–1.49)
Prothrombin Time: 13.1 seconds (ref 11.6–15.2)

## 2012-05-10 LAB — PRO B NATRIURETIC PEPTIDE: Pro B Natriuretic peptide (BNP): 33.7 pg/mL (ref 0–125)

## 2012-05-10 LAB — CK TOTAL AND CKMB (NOT AT ARMC): Total CK: 46 U/L (ref 7–232)

## 2012-05-10 LAB — POCT I-STAT TROPONIN I: Troponin i, poc: 0 ng/mL (ref 0.00–0.08)

## 2012-05-10 MED ORDER — ISOSORBIDE MONONITRATE ER 60 MG PO TB24
60.0000 mg | ORAL_TABLET | Freq: Every day | ORAL | Status: DC
  Administered 2012-05-10: 60 mg via ORAL
  Filled 2012-05-10: qty 1

## 2012-05-10 MED ORDER — ASPIRIN 81 MG PO CHEW
324.0000 mg | CHEWABLE_TABLET | Freq: Once | ORAL | Status: AC
  Administered 2012-05-10: 324 mg via ORAL
  Filled 2012-05-10: qty 4

## 2012-05-10 MED ORDER — SODIUM CHLORIDE 0.9 % IV BOLUS (SEPSIS)
1000.0000 mL | Freq: Once | INTRAVENOUS | Status: AC
  Administered 2012-05-10: 1000 mL via INTRAVENOUS

## 2012-05-10 MED ORDER — ISOSORBIDE MONONITRATE ER 60 MG PO TB24: 60.0000 mg | ORAL_TABLET | Freq: Every day | ORAL | Status: DC

## 2012-05-10 NOTE — ED Provider Notes (Signed)
Medical screening examination/treatment/procedure(s) were conducted as a shared visit with non-physician practitioner(s) and myself.  I personally evaluated the patient during the encounter.  Pt with chest pain. Cardiology consulted. They are to get outpatient stress. IMDUR added. Pt's trops x 2 negative.  Derwood Kaplan, MD 05/10/12 1650

## 2012-05-10 NOTE — H&P (Signed)
Robert [name] is an 61 y.o. male.   Chief Complaint:  Chest Pain HPI:   This is a 61 year old overweight Caucasian male with history of coronary disease diabetes, hypertension, hyperlipidemia and rheumatoid arthritis. In April of this year he underwent left heart catheterization was somehow below normal left ventricular function with mild mid to basal posterior hypocontractility. His 4050% narrowing in the LAD after prominent diagonal vessel, 30% stenosis. He had a diffuse proximal in-stent restenosis in the proximal circumflex produce the stented segment he% 20% narrowing distally in the mid AV groove circumflex stent. 2 occlusion of the proximal right coronary artery with predominantly left to right collaterals. He underwent successful PCI and stent restenosis using cutting balloon arthrotomy. Patient was just seen by Dr. Royann Shivers on 04/17/12 at which time he was started on Ranexa.  Patient presents with chest pain which he said was pressure-like across his anterior chest. Was constant 8/10 in intensity lasting approximately 25 minutes. He was not doing anything in particular at the time and had just sat down.  Approximately 3 miles from home while on the way to the  ER, the pain completely resolved. He stated it did radiate to his back. There was associated with shortness of breath. Denies nausea, vomiting, fever, palpitations, cough, congestion, orthopnea, PND, Lotrimin edema, dizziness, abdominal pain dysuria, hematochezia.  No acute EKG changes.  Past Medical History  Diagnosis Date  . Rheumatoid arthritis   . MI (myocardial infarction)   . Diabetes mellitus   . Hypertension   . Coronary artery disease   . Dysrhythmia   . CHF (congestive heart failure)   . Shortness of breath   . Unstable angina 12/22/2011  . PVD, chronic LLE 12/22/2011    Past Surgical History  Procedure Date  . Coronary stent placement   . Back surgery   . Cholecystectomy   . Abdominal surgery   . Cardiac surgery   .  Coronary angioplasty     Family History  Problem Relation Age of Onset  . Colon cancer Father    Social History:  reports that he has been smoking Cigarettes.  He has a 50 pack-year smoking history. He has never used smokeless tobacco. He reports that he does not drink alcohol or use illicit drugs.  Allergies:  Allergies  Allergen Reactions  . Ivp Dye (Iodinated Diagnostic Agents) Hives and Itching  . Plavix (Clopidogrel Bisulfate) Rash     (Not in a hospital admission)  Results for orders placed during the hospital encounter of 05/10/12 (from the past 48 hour(s))  CBC WITH DIFFERENTIAL     Status: Abnormal   Collection Time   05/10/12 10:45 AM      Component Value Range Comment   WBC 12.1 (*) 4.0 - 10.5 K/uL    RBC 5.43  4.22 - 5.81 MIL/uL    Hemoglobin 17.3 (*) 13.0 - 17.0 g/dL    HCT 16.1  09.6 - 04.5 %    MCV 90.2  78.0 - 100.0 fL    MCH 31.9  26.0 - 34.0 pg    MCHC 35.3  30.0 - 36.0 g/dL    RDW 40.9  81.1 - 91.4 %    Platelets 163  150 - 400 K/uL    Neutrophils Relative 82 (*) 43 - 77 %    Neutro Abs 9.9 (*) 1.7 - 7.7 K/uL    Lymphocytes Relative 12  12 - 46 %    Lymphs Abs 1.5  0.7 - 4.0 K/uL  Monocytes Relative 5  3 - 12 %    Monocytes Absolute 0.6  0.1 - 1.0 K/uL    Eosinophils Relative 1  0 - 5 %    Eosinophils Absolute 0.1  0.0 - 0.7 K/uL    Basophils Relative 0  0 - 1 %    Basophils Absolute 0.0  0.0 - 0.1 K/uL   COMPREHENSIVE METABOLIC PANEL     Status: Abnormal   Collection Time   05/10/12 10:45 AM      Component Value Range Comment   Sodium 136  135 - 145 mEq/L    Potassium 4.0  3.5 - 5.1 mEq/L    Chloride 101  96 - 112 mEq/L    CO2 22  19 - 32 mEq/L    Glucose, Bld 285 (*) 70 - 99 mg/dL    BUN 14  6 - 23 mg/dL    Creatinine, Ser 1.61  0.50 - 1.35 mg/dL    Calcium 9.9  8.4 - 09.6 mg/dL    Total Protein 7.4  6.0 - 8.3 g/dL    Albumin 3.6  3.5 - 5.2 g/dL    AST 19  0 - 37 U/L    ALT 30  0 - 53 U/L    Alkaline Phosphatase 99  39 - 117 U/L     Total Bilirubin 0.2 (*) 0.3 - 1.2 mg/dL    GFR calc non Af Amer >90  >90 mL/min    GFR calc Af Amer >90  >90 mL/min   PRO B NATRIURETIC PEPTIDE     Status: Normal   Collection Time   05/10/12 10:45 AM      Component Value Range Comment   Pro B Natriuretic peptide (BNP) 33.7  0 - 125 pg/mL   APTT     Status: Normal   Collection Time   05/10/12 10:45 AM      Component Value Range Comment   aPTT 28  24 - 37 seconds   PROTIME-INR     Status: Normal   Collection Time   05/10/12 10:45 AM      Component Value Range Comment   Prothrombin Time 13.1  11.6 - 15.2 seconds    INR 0.97  0.00 - 1.49   POCT I-STAT TROPONIN I     Status: Normal   Collection Time   05/10/12 10:49 AM      Component Value Range Comment   Troponin i, poc 0.00  0.00 - 0.08 ng/mL    Comment 3             Dg Chest 2 View  05/10/2012  *RADIOLOGY REPORT*  Clinical Data: Chest pain.  Shortness of breath.  Tobacco use. Hypertension.  Diabetes.  CHEST - 2 VIEW  Comparison: 12/25/2011  Findings: Coronary stents noted.  Mild chronic interstitial accentuation may relate to smoking history.  No mass observed. Cardiac and mediastinal contours appear unremarkable.  No pleural effusion identified.  IMPRESSION: 1.  Mild chronic accentuation of lung markings.  No acute findings.   Original Report Authenticated By: Dellia Cloud, M.D.     Review of Systems  Constitutional: Negative for fever and diaphoresis.  HENT: Negative for congestion and sore throat.   Eyes: Negative for blurred vision and double vision.  Respiratory: Positive for shortness of breath. Negative for cough and wheezing.   Cardiovascular: Positive for chest pain and claudication (left leg, chronic.). Negative for palpitations, orthopnea, leg swelling and PND.  Gastrointestinal: Negative for nausea, vomiting, abdominal  pain, diarrhea, constipation, blood in stool and melena.  Genitourinary: Negative for dysuria and hematuria.  Musculoskeletal: Positive for back  pain (Associated with CP).  Neurological: Negative for dizziness.    Blood pressure 121/66, pulse 83, temperature 98.1 F (36.7 C), temperature source Oral, resp. rate 18, SpO2 95.00%. Physical Exam  Constitutional: He is oriented to person, place, and time. He appears well-developed and well-nourished. No distress.  HENT:  Head: Normocephalic and atraumatic.  Mouth/Throat: No oropharyngeal exudate.  Eyes: EOM are normal. Pupils are equal, round, and reactive to light. No scleral icterus.  Neck: Normal range of motion. No JVD present.  Cardiovascular: Normal rate, regular rhythm, S1 normal and S2 normal.   No murmur heard. Pulses:      Radial pulses are 2+ on the right side, and 2+ on the left side.       Dorsalis pedis pulses are 2+ on the right side, and 0 on the left side.       Posterior tibial pulses are 2+ on the right side, and 0 on the left side.  Respiratory: Effort normal.  GI: Soft. Bowel sounds are normal. He exhibits no distension. There is no tenderness.  Musculoskeletal: He exhibits no edema.  Lymphadenopathy:    He has no cervical adenopathy.  Neurological: He is alert and oriented to person, place, and time. He exhibits abnormal muscle tone.  Skin: Skin is warm and dry.  Psychiatric: He has a normal mood and affect.     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  We will recheck in now hours and if negative, discharge home with Imdur 60mg  and outpatient stress test.  He has an appointment with Dr. Royann Shivers next Wednesday.   HAGER, BRYAN 05/10/2012, 1:41 PM  I have seen and examined the patient along with [name], PA.  I  have reviewed the chart, notes and new data.  I agree with PA's note. Well known to me, see my note earlier this month. Chronically occluded RCA and previosly stented LCX with angioplasty for restenosis earlier this year.  Key new complaints: angina at rest resolved spontaneously Key examination changes: lung findings c/w COPD, otherwie4 normal chest and vascular exam Key new findings / data: low risk ecg and enzymes (one set)  PLAN: DC home if second set of enzymes negative. Outpatient nuclear study. May need to consider two vessel CABG if fails maximum medical Rx.  Thurmon Fair, MD, [facility] [phone] 05/10/2012, 2:37 PM

## 2012-05-10 NOTE — ED Provider Notes (Signed)
History     CSN: [id number]  Arrival date & time 05/10/12  1023   First MD Initiated Contact with Patient 05/10/12 1035      Chief Complaint  Patient presents with  . Chest Pain    (Consider location/radiation/quality/duration/timing/severity/associated sxs/prior treatment) HPI  Robert [name] is a 61 y.o. male diabetic, smoker with past medical history significant for coronary artery disease and MI, complaining of acute onset of shortness of breath with chest pain described as "discomfort" onset at 9 AM today while eating breakfast. Resolved spontaneously after 25 minutes. Patient denies any nausea or vomiting. Pain radiates to the back associated with bilateral upper extremity numbness and tingling. Pt states that this does not feel like prior MI.   Stent placement in 2008 last catheterization was 6 months ago. Cardiology: [facility]    Past Medical History  Diagnosis Date  . Rheumatoid arthritis   . MI (myocardial infarction)   . Diabetes mellitus   . Hypertension   . Coronary artery disease   . Dysrhythmia   . CHF (congestive heart failure)   . Shortness of breath   . Unstable angina 12/22/2011  . PVD, chronic LLE 12/22/2011    Past Surgical History  Procedure Date  . Coronary stent placement   . Back surgery   . Cholecystectomy   . Abdominal surgery   . Cardiac surgery   . Coronary angioplasty     Family History  Problem Relation Age of Onset  . Colon cancer Father     History  Substance Use Topics  . Smoking status: Current Everyday Smoker -- 2.0 packs/day for 25 years    Types: Cigarettes  . Smokeless tobacco: Never Used  . Alcohol Use: No      Review of Systems  Constitutional: Negative for fever.  Respiratory: Positive for shortness of breath.   Cardiovascular: Positive for chest pain. Negative for leg swelling.  All other systems reviewed and are negative.    Allergies  Ivp dye and Plavix  Home Medications   Current Outpatient Rx  Name  Route Sig Dispense Refill  . ALPRAZOLAM 0.5 MG PO TABS Oral Take 0.5 mg by mouth 3 (three) times daily as needed. For anxiety    . ASPIRIN 81 MG PO CHEW Oral Chew 1 tablet (81 mg total) by mouth daily.    [name] CILOSTAZOL 100 MG PO TABS Oral Take 100 mg by mouth 2 (two) times daily.      . OMEGA-3 FATTY ACIDS 1000 MG PO CAPS Oral Take 1 g by mouth 2 (two) times daily.    [name] FOLIC ACID 1 MG PO TABS Oral Take 1 mg by mouth at bedtime.     [name] GLIPIZIDE ER 10 MG PO TB24 Oral Take 10 mg by mouth 2 (two) times daily.    [name] HYDROXYCHLOROQUINE SULFATE 200 MG PO TABS Oral Take 200 mg by mouth 2 (two) times daily.    . INSULIN GLARGINE 100 UNIT/ML [place] SOLN Subcutaneous Inject 20 Units into the skin. As directed per sliding scale    . ISOSORBIDE MONONITRATE ER 30 MG PO TB24 Oral Take 30 mg by mouth daily.    [name] LORATADINE 10 MG PO TABS Oral Take 10 mg by mouth daily as needed. For allergies.    . MELOXICAM 7.5 MG PO TABS Oral Take 7.5 mg by mouth daily.    [name] METFORMIN HCL 1000 MG PO TABS Oral Take 1 tablet (1,000 mg total) by mouth 2 (two) times daily with  a meal.      HOLD TILL Monday 4/15  . METHOTREXATE 2.5 MG PO TABS Oral Take 25 mg by mouth once a week. Caution:Chemotherapy. Protect from light. ON THURSDAYS    . METOPROLOL TARTRATE 50 MG PO TABS Oral Take 50 mg by mouth 2 (two) times daily.      [name] NITROGLYCERIN 0.4 MG SL SUBL Sublingual Place 1 tablet (0.4 mg total) under the tongue every 5 (five) minutes as needed. 25 tablet 2  . PRASUGREL HCL 10 MG PO TABS Oral Take 1 tablet (10 mg total) by mouth daily. 30 tablet 5  . QUINAPRIL HCL 20 MG PO TABS Oral Take 20 mg by mouth at bedtime.      [name] RANOLAZINE ER 1000 MG PO TB12 Oral Take 1,000 mg by mouth 2 (two) times daily.      BP 159/86  Pulse 91  Temp 98.1 F (36.7 C) (Oral)  Resp 16  SpO2 93%  Physical Exam  Nursing note and vitals reviewed. Constitutional: He is oriented to person, place, and time. He appears well-developed and well-nourished. No  distress.  HENT:  Head: Normocephalic and atraumatic.  Eyes: Conjunctivae and EOM are normal. Pupils are equal, round, and reactive to light.  Neck: No JVD present.  Cardiovascular: Normal rate, regular rhythm and normal heart sounds.   Pulmonary/Chest: Effort normal and breath sounds normal. No respiratory distress. He has no wheezes. He has no rales. He exhibits no tenderness.  Abdominal: Soft. Bowel sounds are normal.  Musculoskeletal: Normal range of motion. He exhibits no edema and no tenderness.  Neurological: He is alert and oriented to person, place, and time.  Skin: Skin is warm.  Psychiatric: He has a normal mood and affect.    ED Course  Procedures (including critical care time)  Labs Reviewed  CBC WITH DIFFERENTIAL - Abnormal; Notable for the following:    WBC 12.1 (*)     Hemoglobin 17.3 (*)     Neutrophils Relative 82 (*)     Neutro Abs 9.9 (*)     All other components within normal limits  COMPREHENSIVE METABOLIC PANEL - Abnormal; Notable for the following:    Glucose, Bld 285 (*)     Total Bilirubin 0.2 (*)     All other components within normal limits  PRO B NATRIURETIC PEPTIDE  APTT  PROTIME-INR  POCT I-STAT TROPONIN I  CK TOTAL AND CKMB  TROPONIN I   Dg Chest 2 View  05/10/2012  *RADIOLOGY REPORT*  Clinical Data: Chest pain.  Shortness of breath.  Tobacco use. Hypertension.  Diabetes.  CHEST - 2 VIEW  Comparison: 12/25/2011  Findings: Coronary stents noted.  Mild chronic interstitial accentuation may relate to smoking history.  No mass observed. Cardiac and mediastinal contours appear unremarkable.  No pleural effusion identified.  IMPRESSION: 1.  Mild chronic accentuation of lung markings.  No acute findings.   Original Report Authenticated By: Dellia Cloud, M.D.      Date: 05/10/2012  Rate: 73  Rhythm: normal sinus rhythm  QRS Axis: normal  Intervals: normal  ST/T Wave abnormalities: normal  Conduction Disutrbances:none  Narrative  Interpretation:   Old EKG Reviewed: unchanged   1. Chest pain       MDM  61 y.o. male with history of coronary artery disease status post stent placement complaining of chest is comfort associated with shortness of breath numbness and tingling in both arms for 30 minutes at rest this morning. Pain resolved within 30 minutes  with no intervention. EKG first troponin chest x-ray are normal. Patient has a white blood cell count of 12.1  12:59 PM Pt remains asymptomatic, requests discharge. I would like to get an interval troponin. I will consult his cardologist.   Cardiology consults from [facility] appreciated: he will come to evaluate the pateint  As per cardiology consult second troponin will be drawn if that is negative patient will be discharged into her dosage will be increased to 60 mg and patient will have an outpatient stress test.  Intervals troponin is negative I will discharge the patient for followup with his cardiologist as an outpatient. Stress test is scheduled for September 3. Discussed case with attending who agrees with plan and stability to d/c to home.  Pt verbalized understanding and agrees with care plan. Outpatient follow-up and return precautions given.          Wynetta Emery, PA-C 05/10/12 1539

## 2012-05-10 NOTE — ED Notes (Signed)
Pt sts episode this am of SOB, chest pain, and numbess and tingling in both arms. Denies any pain currently. sts feels ok now and episode didn't last very long.

## 2012-05-10 NOTE — ED Notes (Signed)
Prescription given and reviewed with discharge instructions. Pt verbalized understanding. Alter and oriented x4. Denies chest pain at this time.

## 2012-05-15 ENCOUNTER — Other Ambulatory Visit: Payer: Self-pay | Admitting: Cardiovascular Disease

## 2012-05-15 ENCOUNTER — Encounter (HOSPITAL_COMMUNITY): Payer: Self-pay | Admitting: Pharmacy Technician

## 2012-05-16 ENCOUNTER — Encounter (HOSPITAL_COMMUNITY): Payer: Self-pay | Admitting: General Practice

## 2012-05-16 ENCOUNTER — Encounter (HOSPITAL_COMMUNITY)
Admission: RE | Disposition: A | Discharge status: Home / Self Care | Payer: Self-pay | Source: Ambulatory Visit | Attending: Cardiovascular Disease

## 2012-05-16 ENCOUNTER — Ambulatory Visit (HOSPITAL_COMMUNITY)
Admission: RE | Admit: 2012-05-16 | Discharge: 2012-05-17 | Disposition: A | Discharge status: Home / Self Care | Payer: Medicare Other | Source: Ambulatory Visit | Attending: Cardiovascular Disease | Admitting: Cardiovascular Disease

## 2012-05-16 DIAGNOSIS — F17200 Nicotine dependence, unspecified, uncomplicated: Secondary | ICD-10-CM | POA: Insufficient documentation

## 2012-05-16 DIAGNOSIS — I2510 Atherosclerotic heart disease of native coronary artery without angina pectoris: Secondary | ICD-10-CM | POA: Insufficient documentation

## 2012-05-16 DIAGNOSIS — Y831 Surgical operation with implant of artificial internal device as the cause of abnormal reaction of the patient, or of later complication, without mention of misadventure at the time of the procedure: Secondary | ICD-10-CM | POA: Insufficient documentation

## 2012-05-16 DIAGNOSIS — I200 Unstable angina: Secondary | ICD-10-CM | POA: Insufficient documentation

## 2012-05-16 DIAGNOSIS — R9439 Abnormal result of other cardiovascular function study: Secondary | ICD-10-CM | POA: Diagnosis present

## 2012-05-16 DIAGNOSIS — Z9861 Coronary angioplasty status: Secondary | ICD-10-CM | POA: Insufficient documentation

## 2012-05-16 DIAGNOSIS — T82897A Other specified complication of cardiac prosthetic devices, implants and grafts, initial encounter: Secondary | ICD-10-CM | POA: Insufficient documentation

## 2012-05-16 DIAGNOSIS — Z91041 Radiographic dye allergy status: Secondary | ICD-10-CM | POA: Diagnosis present

## 2012-05-16 DIAGNOSIS — Z720 Tobacco use: Secondary | ICD-10-CM | POA: Diagnosis present

## 2012-05-16 DIAGNOSIS — Z95820 Peripheral vascular angioplasty status with implants and grafts: Secondary | ICD-10-CM

## 2012-05-16 DIAGNOSIS — E119 Type 2 diabetes mellitus without complications: Secondary | ICD-10-CM | POA: Diagnosis present

## 2012-05-16 DIAGNOSIS — Z955 Presence of coronary angioplasty implant and graft: Secondary | ICD-10-CM

## 2012-05-16 DIAGNOSIS — I10 Essential (primary) hypertension: Secondary | ICD-10-CM | POA: Diagnosis present

## 2012-05-16 HISTORY — DX: Pure hypercholesterolemia, unspecified: E78.00

## 2012-05-16 HISTORY — PX: LEFT HEART CATHETERIZATION WITH CORONARY ANGIOGRAM: SHX5451

## 2012-05-16 HISTORY — PX: CORONARY ANGIOPLASTY WITH STENT PLACEMENT: SHX49

## 2012-05-16 HISTORY — PX: PERCUTANEOUS CORONARY STENT INTERVENTION (PCI-S): SHX5485

## 2012-05-16 LAB — GLUCOSE, CAPILLARY
Glucose-Capillary: 214 mg/dL — ABNORMAL HIGH (ref 70–99)
Glucose-Capillary: 287 mg/dL — ABNORMAL HIGH (ref 70–99)
Glucose-Capillary: 470 mg/dL — ABNORMAL HIGH (ref 70–99)

## 2012-05-16 SURGERY — LEFT HEART CATHETERIZATION WITH CORONARY ANGIOGRAM
Anesthesia: LOCAL | Laterality: Bilateral

## 2012-05-16 MED ORDER — ASPIRIN 81 MG PO CHEW: 324.0000 mg | CHEWABLE_TABLET | ORAL | Status: DC

## 2012-05-16 MED ORDER — LIDOCAINE HCL (PF) 1 % IJ SOLN
INTRAMUSCULAR | Status: AC
  Filled 2012-05-16: qty 30

## 2012-05-16 MED ORDER — ASPIRIN 81 MG PO CHEW
81.0000 mg | CHEWABLE_TABLET | Freq: Every day | ORAL | Status: DC
  Administered 2012-05-17: 81 mg via ORAL
  Filled 2012-05-16 (×2): qty 1

## 2012-05-16 MED ORDER — MORPHINE SULFATE 2 MG/ML IJ SOLN: 1.0000 mg | INTRAMUSCULAR | Status: DC | PRN

## 2012-05-16 MED ORDER — PRASUGREL HCL 10 MG PO TABS
10.0000 mg | ORAL_TABLET | Freq: Every day | ORAL | Status: DC
  Administered 2012-05-17: 11:00:00 10 mg via ORAL
  Filled 2012-05-16: qty 1

## 2012-05-16 MED ORDER — RANOLAZINE ER 500 MG PO TB12
1000.0000 mg | ORAL_TABLET | Freq: Two times a day (BID) | ORAL | Status: DC
  Administered 2012-05-16 – 2012-05-17 (×2): 1000 mg via ORAL
  Filled 2012-05-16 (×3): qty 2

## 2012-05-16 MED ORDER — FAMOTIDINE IN NACL 20-0.9 MG/50ML-% IV SOLN
20.0000 mg | INTRAVENOUS | Status: AC
  Administered 2012-05-16: 20 mg via INTRAVENOUS

## 2012-05-16 MED ORDER — ASPIRIN 325 MG PO TABS
325.0000 mg | ORAL_TABLET | Freq: Every day | ORAL | Status: DC
  Filled 2012-05-16: qty 1

## 2012-05-16 MED ORDER — HYDROXYCHLOROQUINE SULFATE 200 MG PO TABS
200.0000 mg | ORAL_TABLET | Freq: Two times a day (BID) | ORAL | Status: DC
  Administered 2012-05-16 – 2012-05-17 (×2): 200 mg via ORAL
  Filled 2012-05-16 (×3): qty 1

## 2012-05-16 MED ORDER — INSULIN ASPART 100 UNIT/ML ~~LOC~~ SOLN
10.0000 [IU] | SUBCUTANEOUS | Status: AC
  Administered 2012-05-16: 22:00:00 10 [IU] via SUBCUTANEOUS

## 2012-05-16 MED ORDER — DIAZEPAM 5 MG PO TABS
ORAL_TABLET | ORAL | Status: AC
  Administered 2012-05-16: 5 mg
  Filled 2012-05-16: qty 1

## 2012-05-16 MED ORDER — METHOTREXATE 2.5 MG PO TABS: 25.0000 mg | ORAL_TABLET | ORAL | Status: DC

## 2012-05-16 MED ORDER — INSULIN GLARGINE 100 UNIT/ML ~~LOC~~ SOLN
20.0000 [IU] | Freq: Two times a day (BID) | SUBCUTANEOUS | Status: DC
  Administered 2012-05-16 – 2012-05-17 (×2): 20 [IU] via SUBCUTANEOUS

## 2012-05-16 MED ORDER — ALPRAZOLAM 0.25 MG PO TABS: 0.5000 mg | ORAL_TABLET | Freq: Three times a day (TID) | ORAL | Status: DC | PRN

## 2012-05-16 MED ORDER — SODIUM CHLORIDE 0.9 % IV SOLN: INTRAVENOUS | Status: AC

## 2012-05-16 MED ORDER — HEPARIN (PORCINE) IN NACL 2-0.9 UNIT/ML-% IJ SOLN
INTRAMUSCULAR | Status: AC
  Filled 2012-05-16: qty 2000

## 2012-05-16 MED ORDER — DIPHENHYDRAMINE HCL 50 MG/ML IJ SOLN
INTRAMUSCULAR | Status: AC
  Filled 2012-05-16: qty 1

## 2012-05-16 MED ORDER — METOPROLOL TARTRATE 50 MG PO TABS
50.0000 mg | ORAL_TABLET | Freq: Two times a day (BID) | ORAL | Status: DC
  Administered 2012-05-16 – 2012-05-17 (×2): 50 mg via ORAL
  Filled 2012-05-16 (×3): qty 1

## 2012-05-16 MED ORDER — DIAZEPAM 5 MG PO TABS: 5.0000 mg | ORAL_TABLET | ORAL | Status: DC

## 2012-05-16 MED ORDER — ACETAMINOPHEN 325 MG PO TABS: 650.0000 mg | ORAL_TABLET | ORAL | Status: DC | PRN

## 2012-05-16 MED ORDER — FOLIC ACID 1 MG PO TABS
1.0000 mg | ORAL_TABLET | Freq: Every day | ORAL | Status: DC
  Administered 2012-05-16: 1 mg via ORAL
  Filled 2012-05-16 (×2): qty 1

## 2012-05-16 MED ORDER — NITROGLYCERIN 0.2 MG/ML ON CALL CATH LAB
INTRAVENOUS | Status: AC
  Filled 2012-05-16: qty 1

## 2012-05-16 MED ORDER — DIPHENHYDRAMINE HCL 50 MG/ML IJ SOLN
25.0000 mg | INTRAMUSCULAR | Status: AC
  Administered 2012-05-16: 25 mg via INTRAVENOUS

## 2012-05-16 MED ORDER — INSULIN ASPART 100 UNIT/ML ~~LOC~~ SOLN: 0.0000 [IU] | Freq: Every day | SUBCUTANEOUS | Status: DC

## 2012-05-16 MED ORDER — GLIPIZIDE ER 10 MG PO TB24
10.0000 mg | ORAL_TABLET | Freq: Two times a day (BID) | ORAL | Status: DC
  Administered 2012-05-16 – 2012-05-17 (×2): 10 mg via ORAL
  Filled 2012-05-16 (×3): qty 1

## 2012-05-16 MED ORDER — PRASUGREL HCL 10 MG PO TABS: 10.0000 mg | ORAL_TABLET | Freq: Every day | ORAL | Status: DC

## 2012-05-16 MED ORDER — GUAIFENESIN-DM 100-10 MG/5ML PO SYRP
5.0000 mL | ORAL_SOLUTION | ORAL | Status: DC
  Filled 2012-05-16 (×2): qty 5

## 2012-05-16 MED ORDER — ATORVASTATIN CALCIUM 20 MG PO TABS
20.0000 mg | ORAL_TABLET | Freq: Every day | ORAL | Status: DC
  Filled 2012-05-16: qty 1

## 2012-05-16 MED ORDER — GUAIFENESIN-DM 100-10 MG/5ML PO SYRP
5.0000 mL | ORAL_SOLUTION | ORAL | Status: DC | PRN
  Administered 2012-05-16 – 2012-05-17 (×2): 5 mL via ORAL
  Filled 2012-05-16 (×2): qty 5

## 2012-05-16 MED ORDER — ISOSORBIDE MONONITRATE ER 60 MG PO TB24
60.0000 mg | ORAL_TABLET | Freq: Every day | ORAL | Status: DC
  Administered 2012-05-16 – 2012-05-17 (×2): 60 mg via ORAL
  Filled 2012-05-16 (×2): qty 1

## 2012-05-16 MED ORDER — OMEGA-3 FATTY ACIDS 1000 MG PO CAPS: 1.0000 g | ORAL_CAPSULE | Freq: Two times a day (BID) | ORAL | Status: DC

## 2012-05-16 MED ORDER — QUINAPRIL HCL 10 MG PO TABS
20.0000 mg | ORAL_TABLET | Freq: Every day | ORAL | Status: DC
Start: 2012-05-16 — End: 2012-05-17
  Administered 2012-05-16: 20 mg via ORAL
  Filled 2012-05-16 (×3): qty 2

## 2012-05-16 MED ORDER — SODIUM CHLORIDE 0.9 % IJ SOLN: 3.0000 mL | INTRAMUSCULAR | Status: DC | PRN

## 2012-05-16 MED ORDER — METHYLPREDNISOLONE SODIUM SUCC 125 MG IJ SOLR
125.0000 mg | INTRAMUSCULAR | Status: AC
  Administered 2012-05-16: 125 mg via INTRAVENOUS
  Filled 2012-05-16: qty 2

## 2012-05-16 MED ORDER — INSULIN ASPART 100 UNIT/ML ~~LOC~~ SOLN
0.0000 [IU] | Freq: Three times a day (TID) | SUBCUTANEOUS | Status: DC
  Administered 2012-05-17: 3 [IU] via SUBCUTANEOUS

## 2012-05-16 MED ORDER — BIVALIRUDIN 250 MG IV SOLR
INTRAVENOUS | Status: AC
  Filled 2012-05-16: qty 250

## 2012-05-16 MED ORDER — CILOSTAZOL 100 MG PO TABS
100.0000 mg | ORAL_TABLET | Freq: Two times a day (BID) | ORAL | Status: DC
  Administered 2012-05-17: 100 mg via ORAL
  Filled 2012-05-16 (×2): qty 1

## 2012-05-16 MED ORDER — SIMVASTATIN 40 MG PO TABS
40.0000 mg | ORAL_TABLET | Freq: Every evening | ORAL | Status: DC
  Filled 2012-05-16: qty 1

## 2012-05-16 MED ORDER — OMEGA-3-ACID ETHYL ESTERS 1 G PO CAPS
1.0000 g | ORAL_CAPSULE | Freq: Two times a day (BID) | ORAL | Status: DC
  Administered 2012-05-16 – 2012-05-17 (×2): 1 g via ORAL
  Filled 2012-05-16 (×3): qty 1

## 2012-05-16 MED ORDER — ASPIRIN 81 MG PO CHEW
CHEWABLE_TABLET | ORAL | Status: AC
  Administered 2012-05-16: 324 mg
  Filled 2012-05-16: qty 4

## 2012-05-16 MED ORDER — SODIUM CHLORIDE 0.9 % IV SOLN
INTRAVENOUS | Status: DC
  Administered 2012-05-16: 12:00:00 via INTRAVENOUS

## 2012-05-16 MED ORDER — ONDANSETRON HCL 4 MG/2ML IJ SOLN: 4.0000 mg | Freq: Four times a day (QID) | INTRAMUSCULAR | Status: DC | PRN

## 2012-05-16 NOTE — Op Note (Signed)
Robert [name] is a 61 y.o. male    [id number] LOCATION:  FACILITY: [facility]  PHYSICIAN: Nanetta Batty, M.D. 1951-02-24   DATE OF PROCEDURE:  05/16/2012  DATE OF DISCHARGE:  [facility]  CARDIAC CATHETERIZATION     History obtained from chart review. Robert [name] is a 61 year old married Caucasian male with a long history of CAD having undergone multiple stents to his native circumflex coronary artery. He is a known occluded dominant RCA with left to right collaterals and mild to moderate left ventricular dysfunction. He underwent cutting balloon atherectomy by Dr. Daphene Jaeger in April of this year. Has had recurrent accelerated angina with a recent recent Myoview stress test was remarkable for new lateral wall ischemia. He saw Dr Royann Shivers  in the office yesterday who arranged for him to undergo cardiac catheterization today to define his anatomy and potentially re\re intervened.   PROCEDURE DESCRIPTION:    The patient was brought to the second floor  [facility] lab in the postabsorptive state. He was  premedicated with 5 mg by mouth Valium as well as IV Solu-Medrol, Benadryl, and Pepcid for contrast allergy prophylaxis.[name] His right groin was prepped and shaved in usual sterile fashion. Xylocaine 1% was used  for local anesthesia. A 5 upgraded to 6 French sheath was inserted into the right common femoral artery using standard Seldinger technique. 5 French right and left Judkins diagnostic catheters along with a 5 French pigtail catheter were used for selective coronary angiography and left ventriculography respectively. Visipaque dye was she is for the entirety of the case. Retrograde aorta, left ventricular and pullback pressures were recorded   HEMODYNAMICS:    AO SYSTOLIC/AO DIASTOLIC: 137/84   LV SYSTOLIC/LV DIASTOLIC: 115/12  ANGIOGRAPHIC RESULTS:   1. Left main; double-barreled  2. LAD; minimal irregularities 3. Left circumflex; non-dominant  with a long segment of stent, 95% proximal to mid in-stent restenosis.  4. Right coronary artery; dominant and occluded proximally 5. Left ventriculography; RAO left ventriculogram was performed using  25 mL of Visipaque dye at 12 mL/second. 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 This usually underwent cutting balloon atherectomy approximately 5 months ago by Dr. Daphene Jaeger. Will proceed with restenting using Abbott Xience  expedition drug-eluting stent.  Procedure description: The patient had already been on aspirin and Effient  prior to admission. Using a 6 [place] XP 3.5 cm guide catheter along with an 014/190 cm long prolonged or guidewire and a 2.5 x 15 mm long emerge balloon predilatation was performed. Following this stenting was performed with a 3.25 mm x 23 mm long expedition drug-eluting stent deployed at 16 atmospheres. This was then post dilated with a 3.5 x 20 mm long noncompliant balloon at 16 atmospheres followed by a 3.75 x 12 mm long noncompliant balloon at 1618 atmospheres (3.85 mm) resulting in reduction with a 95% in-stent restenosis to less than 10% residual.  Overall impression: Successful PCI and restenting using a expedition drug-eluting stent for aggressive "in-stent restenosis". The patient LAD and diagonal branch was free of significant disease making recommendation for coronary bypass grafting not appropriate. The guidewire and catheter were removed. She presents acutely in place. The patient left the Cath Lab in stable condition. He will remain on aspirin and Effient , will be hydrated overnight, and discharged home in  the morning.  Robert Gess MD, [facility] 05/16/2012 2:37 PM

## 2012-05-16 NOTE — H&P (Signed)
  H & P will be scanned in.  Pt was reexamined and existing H & P reviewed. No changes found.  Runell Gess, MD [facility] 05/16/2012 2:31 PM

## 2012-05-17 ENCOUNTER — Encounter (HOSPITAL_COMMUNITY): Payer: Self-pay | Admitting: Cardiology

## 2012-05-17 DIAGNOSIS — R9439 Abnormal result of other cardiovascular function study: Secondary | ICD-10-CM | POA: Diagnosis present

## 2012-05-17 DIAGNOSIS — Z95820 Peripheral vascular angioplasty status with implants and grafts: Secondary | ICD-10-CM

## 2012-05-17 LAB — CBC
Hemoglobin: 16.6 g/dL (ref 13.0–17.0)
MCHC: 36 g/dL (ref 30.0–36.0)
Platelets: 163 10*3/uL (ref 150–400)
RBC: 5.16 MIL/uL (ref 4.22–5.81)

## 2012-05-17 LAB — BASIC METABOLIC PANEL
GFR calc non Af Amer: >90 mL/min (ref >90)
Glucose, Bld: 229 mg/dL — ABNORMAL HIGH (ref 70–99)
Potassium: 3.9 mEq/L (ref 3.5–5.1)
Sodium: 135 mEq/L (ref 135–145)

## 2012-05-17 LAB — POCT ACTIVATED CLOTTING TIME: Activated Clotting Time: 359 seconds

## 2012-05-17 MED ORDER — NITROGLYCERIN 0.4 MG SL SUBL: 0.4000 mg | SUBLINGUAL_TABLET | SUBLINGUAL | Status: DC | PRN

## 2012-05-17 MED ORDER — ASPIRIN 81 MG PO CHEW: 81.0000 mg | CHEWABLE_TABLET | Freq: Every day | ORAL | Status: DC

## 2012-05-17 MED ORDER — NICOTINE 21 MG/24HR TD PT24: 1.0000 | MEDICATED_PATCH | TRANSDERMAL | Status: AC

## 2012-05-17 MED FILL — Dextrose Inj 5%: INTRAVENOUS | Qty: 50 | Status: AC

## 2012-05-17 NOTE — Progress Notes (Signed)
Assessed for utilization review 

## 2012-05-17 NOTE — Plan of Care (Signed)
Problem: Consults Goal: Tobacco Cessation referral if indicated Outcome: Completed/Met Date Met:  05/17/12 Discussed smoking cessation with patient, states he has tried to quit before using chantix, patch, and gum at different attempts. Pt states he just needs to "get his mind set."  Encouragement provided and tips for success given.  Given smoking cessation handout with support line #.  Identified motivation of being good example for his sons.

## 2012-05-17 NOTE — Progress Notes (Signed)
[id number] Cardiac Rehab Pt states that he walked in hall without cp or SOB. Completed discharge education with pt and wife. He voices understanding. Discussed smoking cessation with pt. He seems motivated to quit and he has tips for quitting information.

## 2012-05-17 NOTE — Progress Notes (Signed)
Subjective: No chest pain, even after walking the hall twice.  Also would like to stop smoking, will try patch.  Objective: Vital signs in last 24 hours: Temp:  [96.9 F (36.1 C)-97.7 F (36.5 C)] 97.5 F (36.4 C) (09/06 0809) Pulse Rate:  [78-112] 80  (09/06 0809) Resp:  [18-27] 24  (09/06 0809) BP: (119-139)/(69-89) 139/73 mmHg (09/06 0809) SpO2:  [92 %-98 %] 95 % (09/06 0809) Weight:  [106.1 kg (233 lb 14.5 oz)-108.863 kg (240 lb)] 106.1 kg (233 lb 14.5 oz) (09/05 2324) Weight change:  Last BM Date: 05/16/12 Intake/Output from previous day:  -1237 09/05 0701 - 09/06 0700 In: 1287.5 [P.O.:640; I.V.:647.5] Out: 2525 [Urine:2525] Intake/Output this shift:   PE: General:alert and oriented, NAD Heart:S1S2 RRR, no M/[name]/G Lungs:clear, without wheezes Abd:+ BS, non tender Ext:no edema. Cath site without hematoma; no bruit  Lab Results:  Basename 05/17/12 0545  WBC 15.0*  HGB 16.6  HCT 46.1  PLT 163   BMET  Basename 05/17/12 0545 05/16/12 2218  NA 135 --  K 3.9 --  CL 100 --  CO2 24 --  GLUCOSE 229* 460*  BUN 14 --  CREATININE 0.79 --  CALCIUM 9.8 --   No results found for this basename: TROPONINI:2,CK,MB:2 in the last 72 hours  Lab Results  Component Value Date   CHOL 146 12/22/2011   HDL 34* 12/22/2011   LDLCALC 77 12/22/2011   TRIG 175* 12/22/2011   CHOLHDL 4.3 12/22/2011   Lab Results  Component Value Date   HGBA1C 8.7* 12/21/2011     Lab Results  Component Value Date   TSH 2.329 12/21/2011   Studies/Results: Cardiac cath: 1. Left main; double-barreled  2. LAD; minimal irregularities  3. Left circumflex; non-dominant with a long segment of stent, 95% proximal to mid in-stent restenosis.  - s/p DES PCI 4. Right coronary artery; dominant and occluded proximally  5. Left ventriculography; The overall LVEF estimated 45-50 % With wall motion abnormalities notable for moderate to severe inferobasal hypokinesia   Successful PCI and restenting using a Xience  Expedition DES for aggressive "in-stent restenosis". The patient LAD and diagonal branch was free of significant disease making recommendation for coronary bypass grafting not appropriate  Medications: I have reviewed medications.   [name] aspirin      . aspirin  81 mg Oral Daily  . atorvastatin  20 mg Oral q1800  . bivalirudin      . cilostazol  100 mg Oral BID  . diazepam      . diphenhydrAMINE      . diphenhydrAMINE  25 mg Intravenous On Call  . famotidine (PEPCID) IV  20 mg Intravenous On Call  . folic acid  1 mg Oral QHS  . glipiZIDE  10 mg Oral BID  . guaiFENesin-dextromethorphan  5 mL Oral To Cath  . heparin      . hydroxychloroquine  200 mg Oral BID  . insulin aspart  0-5 Units Subcutaneous QHS  . insulin aspart  0-9 Units Subcutaneous TID WC  . insulin aspart  10 Units Subcutaneous NOW  . insulin glargine  20 Units Subcutaneous BID  . isosorbide mononitrate  60 mg Oral Daily  . lidocaine      . methotrexate  25 mg Oral Weekly  . methylPREDNISolone (SOLU-MEDROL) injection  125 mg Intravenous On Call  . metoprolol  50 mg Oral BID  . nitroGLYCERIN      . omega-3 acid ethyl esters  1 g Oral BID  .  prasugrel  10 mg Oral Daily  . quinapril  20 mg Oral QHS  . ranolazine  1,000 mg Oral BID   Assessment/Plan: Principal Problem:  *Unstable angina Active Problems:  Type II IDDM  HTN (hypertension)  Tobacco abuse  CAD, MI '95, CFX stent '04, CFX DES 9/06, CFX DES 8/07 with OM POBA  Contrast media allergy  Abnormal nuclear stress test  S/P angioplasty with stent of LCX 05/16/12 with expedition DES  PLAN: Ambulate, discharge home.  Glucose is elevated give meds now.   Resp. Elevated with activity and increased HR.  Room smells of tobacco smoke.  Pt. Would like to try Nicoderm patch.  Will order.  Off Glucophage for 48 hours. Will f/u with Dr. Royann Shivers in [place].  LOS: 1 day   Robert [name],Robert [name] 05/17/2012, 8:14 AM  I have seen & examined the patient this AM.  He feels notably  better, ambulating without Angina.  Expectedly SOB with brisk ambulating.  Groin site looks great.  Is already on DAPT.  Agree with Nicoderm patch for smoking cessation -- > 10 min spent btw NP & MP on smoking cessation counseling in addition to RN directed counseling. ROV with Dr. Salena Saner.  He is ready for discharge. On Statin, ACE-I & BB. Restart metformin after 48hr.  Marykay Lex, M.D., M.S. [facility] [address] [place], [place]  [address]  [phone] Pager # [phone]  05/17/2012 9:46 AM

## 2012-05-18 NOTE — Discharge Summary (Signed)
Physician Discharge Summary  Patient ID: Robert [name] MRN: [id number] DOB/AGE: 1951-06-03 61 y.o.  Admit date: 05/16/2012 Discharge date: 05/17/2012  Discharge Diagnoses:  Principal Problem:  *Unstable angina, resolved with PCI Active Problems:  Type II IDDM  HTN (hypertension)  Tobacco abuse  CAD, MI '95, CFX stent '04, CFX DES 9/06, CFX DES 8/07 with OM POBA  Contrast media allergy  Abnormal nuclear stress test  S/P angioplasty with stent of LCX 05/16/12 with expedition DES   Discharged Condition: good  PROCEDURES:Cardiac cath 05/16/2012 by Dr. Allyson Sabal 05/16/12  Successful PCI and restenting using a expedition drug-eluting stent for aggressive "in-stent restenosis to LCX by Dr. Crista Curb Course: 61 year old married Caucasian male with a long history of CAD having undergone multiple stents to his native circumflex coronary artery. He is a known occluded dominant RCA with left to right collaterals and mild to moderate left ventricular dysfunction. He underwent cutting balloon atherectomy by Dr. Daphene Jaeger in April of this year. Has had recurrent accelerated angina with a recent recent Myoview stress test was remarkable for new lateral wall ischemia. He saw Dr Royann Shivers in the office yesterday who arranged for him to undergo cardiac catheterization today to define his anatomy and potentially re\re intervene.    The patient presented electively for cardiac cath and possible intervention. The cardiac catheterization revealed Left circumflex; non-dominant with a long segment of stent, 95% proximal to mid in-stent restenosis.  Right coronary artery; dominant and occluded proximally.  He underwent PTCA and stent deployment for in-stent restenosis With a Xience expedition drug-eluting stent.  He tolerated the procedure without complications. The patient continues to use tobacco, his nurse and Dr. [name] and myself discussed tobacco cessation with the patient he has agreed to do NicoDerm patches.     By the next morning he was stable, he ambulated with cardiac rehabilitation and was actually able to walk much further than prior to admission.  No chest pain and minimal shortness of breath.  He was seen and evaluated by Dr. [name] and felt ready for discharge home.  He'll followup as an outpatient.  His glucose was elevated but once he was back on his Lantus glucose improved. He was instructed to hold his metformin for 48 hours due to possible interaction with cath dye.  EKG remained stable sinus rhythm.  Consults: None  Significant Diagnostic Studies:  BMET    Component Value Date/Time   NA 135 05/17/2012 0545   K 3.9 05/17/2012 0545   CL 100 05/17/2012 0545   CO2 24 05/17/2012 0545   GLUCOSE 229* 05/17/2012 0545   BUN 14 05/17/2012 0545   CREATININE 0.79 05/17/2012 0545   CALCIUM 9.8 05/17/2012 0545   GFRNONAA >90 05/17/2012 0545   GFRAA >90 05/17/2012 0545    CBC    Component Value Date/Time   WBC 15.0* 05/17/2012 0545   RBC 5.16 05/17/2012 0545   HGB 16.6 05/17/2012 0545   HCT 46.1 05/17/2012 0545   PLT 163 05/17/2012 0545   MCV 89.3 05/17/2012 0545   MCH 32.2 05/17/2012 0545   MCHC 36.0 05/17/2012 0545   RDW 15.1 05/17/2012 0545   LYMPHSABS 1.5 05/10/2012 1045   MONOABS 0.6 05/10/2012 1045   EOSABS 0.1 05/10/2012 1045   BASOSABS 0.0 05/10/2012 1045       Discharge Exam: Blood pressure 139/73, pulse 80, temperature 97.5 F (36.4 C), temperature source Oral, resp. rate 24, height 6' (1.829 m), weight 106.1 kg (233 lb 14.5 oz), SpO2 95.00%.  PE: General:alert and oriented, NAD  Heart:S1S2 RRR, no M/R/G  Lungs:clear, without wheezes  Abd:+ BS, non tender  Ext:no edema. Cath site without hematoma; no bruit     Disposition: 01-Home or Self Care  Discharge Orders    Future Orders Please Complete By Expires   Amb Referral to Cardiac Rehabilitation        Medication List  As of 05/18/2012  5:25 PM   STOP taking these medications         aspirin 325 MG tablet         TAKE these  medications         ALPRAZolam 0.5 MG tablet   Commonly known as: XANAX   Take 0.5 mg by mouth 3 (three) times daily as needed. For anxiety      aspirin 81 MG chewable tablet   Chew 1 tablet (81 mg total) by mouth daily.      cilostazol 100 MG tablet   Commonly known as: PLETAL   Take 100 mg by mouth 2 (two) times daily.      fish oil-omega-3 fatty acids 1000 MG capsule   Take 1 g by mouth 2 (two) times daily.      folic acid 1 MG tablet   Commonly known as: FOLVITE   Take 1 mg by mouth at bedtime.      glipiZIDE 10 MG 24 hr tablet   Commonly known as: GLUCOTROL XL   Take 10 mg by mouth 2 (two) times daily.      hydroxychloroquine 200 MG tablet   Commonly known as: PLAQUENIL   Take 200 mg by mouth 2 (two) times daily.      insulin glargine 100 UNIT/ML injection   Commonly known as: LANTUS   Inject 20 Units into the skin 2 (two) times daily.      isosorbide mononitrate 60 MG 24 hr tablet   Commonly known as: IMDUR   Take 60 mg by mouth daily.      metFORMIN 1000 MG tablet   Commonly known as: GLUCOPHAGE   Take 1 tablet (1,000 mg total) by mouth 2 (two) times daily with a meal.      methotrexate 2.5 MG tablet   Commonly known as: RHEUMATREX   Take 25 mg by mouth once a week. Caution:Chemotherapy. Protect from light. ON THURSDAYS      metoprolol 50 MG tablet   Commonly known as: LOPRESSOR   Take 50 mg by mouth 2 (two) times daily.      nicotine 21 mg/24hr patch   Commonly known as: NICODERM CQ - dosed in mg/24 hours   Place 1 patch onto the skin daily.      nitroGLYCERIN 0.4 MG SL tablet   Commonly known as: NITROSTAT   Place 1 tablet (0.4 mg total) under the tongue every 5 (five) minutes as needed for chest pain. For chest pain      prasugrel 10 MG Tabs   Commonly known as: EFFIENT   Take 1 tablet (10 mg total) by mouth daily.      quinapril 20 MG tablet   Commonly known as: ACCUPRIL   Take 20 mg by mouth at bedtime.      RANEXA 1000 MG SR tablet    Generic drug: ranolazine   Take 1,000 mg by mouth 2 (two) times daily.      simvastatin 40 MG tablet   Commonly known as: ZOCOR   Take 40 mg by mouth every evening.  Follow-up Information    Follow up with Thurmon Fair, MD on 06/05/2012. (at 3:15 pm in [place] office.)    Contact information:   [address] [place] [address] [phone]        DISCHARGE INSTRUCTIONS: Call [facility] if any bleeding, swelling or drainage at cath site.  May shower, no tub baths for 48 hours for groin sticks.   Do Not Smoke with Nicoderm patch in place.  Heart Healthy Diabetic Diet.  NO METFORMIN until 05/19/2012, it may interact with cath dye.  Do not stop Effient.  No lifting over 5 pounds for 5 days. No driving for 3 days.   SignedLeone Brand 05/18/2012, 5:25 PM  I saw & examined Robert [name] on the day of discharge.  He tolerated his procedure well and was ambulating in the halls with no further exertional angina.  We spent over combined discussing smoking cessation -- will try Nicoderm patches.  He is ready for discharge.  Marykay Lex, M.D., M.S. [facility] [address] [place], [place]  [address]  [phone] Pager # [phone]  05/19/2012 1:00 PM

## 2012-10-21 ENCOUNTER — Other Ambulatory Visit (HOSPITAL_COMMUNITY): Payer: Self-pay | Admitting: Cardiovascular Disease

## 2012-10-21 DIAGNOSIS — I2510 Atherosclerotic heart disease of native coronary artery without angina pectoris: Secondary | ICD-10-CM

## 2012-10-21 DIAGNOSIS — R0602 Shortness of breath: Secondary | ICD-10-CM

## 2012-10-21 DIAGNOSIS — I509 Heart failure, unspecified: Secondary | ICD-10-CM

## 2012-10-24 ENCOUNTER — Ambulatory Visit (HOSPITAL_COMMUNITY): Payer: Medicare Other

## 2012-10-30 ENCOUNTER — Ambulatory Visit (HOSPITAL_COMMUNITY)
Admission: RE | Admit: 2012-10-30 | Discharge: 2012-10-30 | Disposition: A | Discharge status: Home / Self Care | Payer: Medicare Other | Source: Ambulatory Visit | Attending: Cardiovascular Disease | Admitting: Cardiovascular Disease

## 2012-10-30 DIAGNOSIS — I739 Peripheral vascular disease, unspecified: Secondary | ICD-10-CM | POA: Insufficient documentation

## 2012-10-30 DIAGNOSIS — I2510 Atherosclerotic heart disease of native coronary artery without angina pectoris: Secondary | ICD-10-CM

## 2012-10-30 DIAGNOSIS — I509 Heart failure, unspecified: Secondary | ICD-10-CM

## 2012-10-30 DIAGNOSIS — I059 Rheumatic mitral valve disease, unspecified: Secondary | ICD-10-CM | POA: Insufficient documentation

## 2012-10-30 DIAGNOSIS — E119 Type 2 diabetes mellitus without complications: Secondary | ICD-10-CM | POA: Insufficient documentation

## 2012-10-30 DIAGNOSIS — R0602 Shortness of breath: Secondary | ICD-10-CM

## 2012-10-30 NOTE — Progress Notes (Signed)
[facility]   2D echo completed 10/30/2012.   Cindy Karessa Onorato, RDCS  

## 2012-11-13 ENCOUNTER — Other Ambulatory Visit: Payer: Self-pay | Admitting: Cardiovascular Disease

## 2012-11-13 ENCOUNTER — Encounter (HOSPITAL_COMMUNITY): Payer: Self-pay

## 2012-11-13 NOTE — H&P (Signed)
[facility]       HISTORY & PHYSICAL  Reason for Admission/Procedure: Unstable angina/CHF; suspect in-stent restenosis  Performing Physician: Nicki Guadalajara, MD  Cardiologist: Croitoru  HPI: This is a 62 y.o. male with a past medical history significant for CAD and repeated LCX  PCI, returns with symptoms similar to prior presentations: exertional dyspnea and exertional chest tightness relieved by rest, in a slowly accelerating pattern over last month. Echo shows normal LVEF with mild inferolateral hypokinesis and mild diastolic dysfunction. Diuretics have not led to any improvement.  -cath 2004 showed total occlusion of RCA and intermediate lesions LAD, high grade mid LCX treated with 4x18 mm BMS (Velocity).  - 2006 overlapping 3.5x23 mm Cypher inLCX - 2007 new downstream stenosis at OM bifurcation treated with 3x18 mm Cypher stent   - 2008 acute stent thrombosis (allergic to plavix, temporarily stopped Ticlid) - thrombolysis/aspiration only - April 2013 cutting balloon angioplasty for proximal LCX in stent restenosis (Dr. Tresa Endo) - September 2013 repeat restenosis treated with "stent in stent" Xience Expedition 3.25 x 23 mm (Dr. Allyson Sabal). No change in RCA or LAD anatomy during last several years.  Unfortunately he continues to smoke.  Mediocre glycemic control despite insulin. Good lipids except minimal elevation in TG. LDL in 70s.  Allergic to iodinated contrast, but did well with premedication. Allergic to clopidogrel but tolerates Effient well.  PMHx:  Past Medical History  Diagnosis Date  . Hypertension   . Coronary artery disease   . Dysrhythmia   . CHF (congestive heart failure)   . Unstable angina 12/22/2011  . PVD, chronic LLE 12/22/2011  . High cholesterol   . 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 Chronic bronchitis     "maybe once/yr; last time 04/2012" (05/16/2012)  .  Shortness of breath 05/16/2012    "@ rest, lying down, w/exertion"  . Type II diabetes mellitus   . Rheumatoid arthritis     "qwhere on my body"  . Abnormal nuclear stress test 05/17/2012  . S/P angioplasty with stent of LCX 05/16/12 with expedition DES 05/17/2012   Past Surgical History  Procedure Laterality Date  . Back surgery    . Coronary angioplasty    . Coronary angioplasty with stent placement  05/16/2012    "1; makes total ~ 4"  . Cholecystectomy  ~1995    FAMHx: Family History  Problem Relation Age of Onset  . Colon cancer Father     SOCHx:  reports that he has been smoking Cigarettes.  He has a 41 pack-year smoking history. He has never used smokeless tobacco. He reports that he does not drink alcohol or use illicit drugs.  ALLERGIES: Allergies  Allergen Reactions  . Ivp Dye (Iodinated Diagnostic Agents) Hives and Itching  . Plavix (Clopidogrel Bisulfate) Rash    ROS: Constitutional: positive for fatigue, negative for chills, fevers and night sweats Respiratory: positive for cough and dyspnea on exertion, negative for asthma, hemoptysis, pleurisy/chest pain, sputum and wheezing Cardiovascular: positive for dyspnea and exertional chest pressure/discomfort, negative for irregular heart beat, near-syncope, orthopnea, palpitations, paroxysmal nocturnal dyspnea and syncope Gastrointestinal: positive for  , negative for abdominal pain, change in bowel habits, melena, nausea, odynophagia, reflux symptoms and vomiting Hematologic/lymphatic: negative for bleeding and easy bruising Musculoskeletal:positive for arthralgias and stiff joints, negative for muscle weakness and myalgias Neurological: negative for coordination problems, dizziness, gait problems,  memory problems, seizures, speech problems and weakness Behavioral/Psych: negative for decreased appetite, excessive alcohol consumption and mood swings Endocrine: negative for diabetic symptoms including polydipsia, polyphagia and  polyuria and temperature intolerance  HOME MEDICATIONS: No prescriptions prior to admission    HOSPITAL MEDICATIONS: I have reviewed the patient's current medications. Prior to Admission:  No prescriptions prior to admission    VITALS: 130/70,  70,  16,  Afebrile,  239 lb, 6", BMi 32.5  PHYSICAL EXAM: General appearance: alert, cooperative and no distress Neck: no adenopathy, no carotid bruit, no JVD, supple, symmetrical, trachea midline and thyroid not enlarged, symmetric, no tenderness/mass/nodules Lungs: clear to auscultation bilaterally and normal percussion bilaterally Heart: regular rate and rhythm, S1, S2 normal, no murmur, click, rub or gallop Abdomen: soft, non-tender; bowel sounds normal; no masses,  no organomegaly Extremities: extremities normal, atraumatic, no cyanosis or edema and swelling of small joints in both hands and bursitis left elbow Pulses: 2+ and symmetric Skin: Skin color, texture, turgor normal. No rashes or lesions Neurologic: Grossly normal  LABS: No results found for this or any previous visit (from the past 48 hour(s)).  IMAGING: No results found.  IMPRESSION: 1. Accelerating angina and increasing dyspnea despite normal EF and minimal signs of diastolic dysfunction, 6 months after last PCI, strongly suggest in stent restenosis.   RECOMMENDATION: Recommend proceeding to coronary angio. Consider referral for brachytherapy or CABG.  Time Spent Directly with Patient: 60 minutes   CROITORU,MIHAI 11/13/2012, 1:48 PM

## 2012-11-14 ENCOUNTER — Encounter (HOSPITAL_COMMUNITY): Payer: Self-pay | Admitting: General Practice

## 2012-11-14 ENCOUNTER — Ambulatory Visit (HOSPITAL_COMMUNITY)
Admission: RE | Admit: 2012-11-14 | Discharge: 2012-11-15 | Disposition: A | Discharge status: Home / Self Care | Payer: Medicare Other | Source: Ambulatory Visit | Attending: Cardiovascular Disease | Admitting: Cardiovascular Disease

## 2012-11-14 ENCOUNTER — Encounter (HOSPITAL_COMMUNITY)
Admission: RE | Disposition: A | Discharge status: Home / Self Care | Payer: Self-pay | Source: Ambulatory Visit | Attending: Cardiovascular Disease

## 2012-11-14 DIAGNOSIS — I2582 Chronic total occlusion of coronary artery: Secondary | ICD-10-CM | POA: Insufficient documentation

## 2012-11-14 DIAGNOSIS — Z951 Presence of aortocoronary bypass graft: Secondary | ICD-10-CM | POA: Insufficient documentation

## 2012-11-14 DIAGNOSIS — I252 Old myocardial infarction: Secondary | ICD-10-CM | POA: Insufficient documentation

## 2012-11-14 DIAGNOSIS — T82897A Other specified complication of cardiac prosthetic devices, implants and grafts, initial encounter: Secondary | ICD-10-CM | POA: Insufficient documentation

## 2012-11-14 DIAGNOSIS — I739 Peripheral vascular disease, unspecified: Secondary | ICD-10-CM | POA: Insufficient documentation

## 2012-11-14 DIAGNOSIS — I200 Unstable angina: Secondary | ICD-10-CM | POA: Insufficient documentation

## 2012-11-14 DIAGNOSIS — Z91041 Radiographic dye allergy status: Secondary | ICD-10-CM | POA: Diagnosis present

## 2012-11-14 DIAGNOSIS — Z720 Tobacco use: Secondary | ICD-10-CM | POA: Diagnosis present

## 2012-11-14 DIAGNOSIS — Y831 Surgical operation with implant of artificial internal device as the cause of abnormal reaction of the patient, or of later complication, without mention of misadventure at the time of the procedure: Secondary | ICD-10-CM | POA: Insufficient documentation

## 2012-11-14 DIAGNOSIS — Z95820 Peripheral vascular angioplasty status with implants and grafts: Secondary | ICD-10-CM

## 2012-11-14 DIAGNOSIS — E119 Type 2 diabetes mellitus without complications: Secondary | ICD-10-CM | POA: Insufficient documentation

## 2012-11-14 DIAGNOSIS — I10 Essential (primary) hypertension: Secondary | ICD-10-CM | POA: Insufficient documentation

## 2012-11-14 DIAGNOSIS — F17200 Nicotine dependence, unspecified, uncomplicated: Secondary | ICD-10-CM | POA: Insufficient documentation

## 2012-11-14 DIAGNOSIS — I2510 Atherosclerotic heart disease of native coronary artery without angina pectoris: Secondary | ICD-10-CM | POA: Insufficient documentation

## 2012-11-14 HISTORY — PX: PERCUTANEOUS CORONARY INTERVENTION-BALLOON ONLY: SHX6014

## 2012-11-14 HISTORY — PX: LEFT HEART CATHETERIZATION WITH CORONARY ANGIOGRAM: SHX5451

## 2012-11-14 LAB — BASIC METABOLIC PANEL
BUN: 14 mg/dL (ref 6–23)
CO2: 21 mEq/L (ref 19–32)
Calcium: 9.8 mg/dL (ref 8.4–10.5)
GFR calc non Af Amer: >90 mL/min (ref >90)
Glucose, Bld: 315 mg/dL — ABNORMAL HIGH (ref 70–99)
Potassium: 4.4 mEq/L (ref 3.5–5.1)

## 2012-11-14 LAB — GLUCOSE, CAPILLARY

## 2012-11-14 LAB — PROTIME-INR: Prothrombin Time: 13.4 seconds (ref 11.6–15.2)

## 2012-11-14 LAB — CBC
Hemoglobin: 16.8 g/dL (ref 13.0–17.0)
MCH: 30.9 pg (ref 26.0–34.0)
MCHC: 35.6 g/dL (ref 30.0–36.0)
MCV: 86.9 fL (ref 78.0–100.0)

## 2012-11-14 SURGERY — LEFT HEART CATHETERIZATION WITH CORONARY ANGIOGRAM
Anesthesia: LOCAL

## 2012-11-14 MED ORDER — TRAMADOL HCL 50 MG PO TABS: 50.0000 mg | ORAL_TABLET | Freq: Four times a day (QID) | ORAL | Status: DC | PRN

## 2012-11-14 MED ORDER — ASPIRIN 81 MG PO CHEW
324.0000 mg | CHEWABLE_TABLET | ORAL | Status: AC
  Administered 2012-11-14: 324 mg via ORAL

## 2012-11-14 MED ORDER — OMEGA-3-ACID ETHYL ESTERS 1 G PO CAPS
1.0000 g | ORAL_CAPSULE | Freq: Two times a day (BID) | ORAL | Status: DC
  Administered 2012-11-14 – 2012-11-15 (×2): 1 g via ORAL
  Filled 2012-11-14 (×3): qty 1

## 2012-11-14 MED ORDER — GLIPIZIDE ER 10 MG PO TB24
10.0000 mg | ORAL_TABLET | Freq: Two times a day (BID) | ORAL | Status: DC
  Administered 2012-11-14 – 2012-11-15 (×2): 10 mg via ORAL
  Filled 2012-11-14 (×3): qty 1

## 2012-11-14 MED ORDER — PRASUGREL HCL 10 MG PO TABS
ORAL_TABLET | ORAL | Status: AC
  Administered 2012-11-15: 10:00:00 10 mg via ORAL
  Filled 2012-11-14: qty 1

## 2012-11-14 MED ORDER — INSULIN ASPART 100 UNIT/ML ~~LOC~~ SOLN
0.0000 [IU] | Freq: Three times a day (TID) | SUBCUTANEOUS | Status: DC
  Administered 2012-11-14: 15 [IU] via SUBCUTANEOUS
  Administered 2012-11-15: 11 [IU] via SUBCUTANEOUS
  Administered 2012-11-15: 5 [IU] via SUBCUTANEOUS

## 2012-11-14 MED ORDER — ZOLPIDEM TARTRATE 5 MG PO TABS: 10.0000 mg | ORAL_TABLET | Freq: Every evening | ORAL | Status: DC | PRN

## 2012-11-14 MED ORDER — INSULIN GLARGINE 100 UNIT/ML ~~LOC~~ SOLN
20.0000 [IU] | Freq: Two times a day (BID) | SUBCUTANEOUS | Status: DC
  Administered 2012-11-14 – 2012-11-15 (×2): 20 [IU] via SUBCUTANEOUS

## 2012-11-14 MED ORDER — NITROGLYCERIN 1 MG/10 ML FOR IR/CATH LAB
INTRA_ARTERIAL | Status: AC
  Filled 2012-11-14: qty 10

## 2012-11-14 MED ORDER — MIDAZOLAM HCL 2 MG/2ML IJ SOLN
INTRAMUSCULAR | Status: AC
  Filled 2012-11-14: qty 2

## 2012-11-14 MED ORDER — HEPARIN (PORCINE) IN NACL 2-0.9 UNIT/ML-% IJ SOLN
INTRAMUSCULAR | Status: AC
  Filled 2012-11-14: qty 1000

## 2012-11-14 MED ORDER — OMEGA-3 FATTY ACIDS 1000 MG PO CAPS: 1.0000 g | ORAL_CAPSULE | Freq: Two times a day (BID) | ORAL | Status: DC

## 2012-11-14 MED ORDER — SODIUM CHLORIDE 0.9 % IV SOLN: INTRAVENOUS | Status: DC

## 2012-11-14 MED ORDER — SODIUM CHLORIDE 0.9 % IV SOLN
INTRAVENOUS | Status: DC
  Administered 2012-11-14: 15:00:00 via INTRAVENOUS

## 2012-11-14 MED ORDER — ASPIRIN EC 81 MG PO TBEC
81.0000 mg | DELAYED_RELEASE_TABLET | Freq: Every day | ORAL | Status: DC
  Administered 2012-11-15: 81 mg via ORAL
  Filled 2012-11-14 (×2): qty 1

## 2012-11-14 MED ORDER — FUROSEMIDE 20 MG PO TABS
20.0000 mg | ORAL_TABLET | Freq: Two times a day (BID) | ORAL | Status: DC
Start: 2012-11-15 — End: 2012-11-15
  Administered 2012-11-15: 20 mg via ORAL
  Filled 2012-11-14 (×2): qty 1

## 2012-11-14 MED ORDER — ISOSORBIDE MONONITRATE ER 60 MG PO TB24
60.0000 mg | ORAL_TABLET | Freq: Every day | ORAL | Status: DC
  Administered 2012-11-14 – 2012-11-15 (×2): 60 mg via ORAL
  Filled 2012-11-14 (×2): qty 1

## 2012-11-14 MED ORDER — METOPROLOL TARTRATE 50 MG PO TABS
75.0000 mg | ORAL_TABLET | Freq: Two times a day (BID) | ORAL | Status: DC
  Administered 2012-11-14 – 2012-11-15 (×2): 75 mg via ORAL
  Filled 2012-11-14 (×3): qty 1

## 2012-11-14 MED ORDER — SIMVASTATIN 40 MG PO TABS
40.0000 mg | ORAL_TABLET | Freq: Every evening | ORAL | Status: DC
Start: 2012-11-14 — End: 2012-11-15
  Administered 2012-11-14: 18:00:00 40 mg via ORAL
  Filled 2012-11-14 (×2): qty 1

## 2012-11-14 MED ORDER — METFORMIN HCL 500 MG PO TABS: 1000.0000 mg | ORAL_TABLET | Freq: Two times a day (BID) | ORAL | Status: DC

## 2012-11-14 MED ORDER — FAMOTIDINE 20 MG PO TABS: 20.0000 mg | ORAL_TABLET | ORAL | Status: DC

## 2012-11-14 MED ORDER — PRASUGREL HCL 10 MG PO TABS
10.0000 mg | ORAL_TABLET | Freq: Every day | ORAL | Status: DC
  Filled 2012-11-14: qty 1

## 2012-11-14 MED ORDER — BIVALIRUDIN 250 MG IV SOLR
INTRAVENOUS | Status: AC
  Filled 2012-11-14: qty 250

## 2012-11-14 MED ORDER — METHYLPREDNISOLONE SODIUM SUCC 125 MG IJ SOLR
INTRAMUSCULAR | Status: AC
  Filled 2012-11-14: qty 2

## 2012-11-14 MED ORDER — SODIUM CHLORIDE 0.9 % IV SOLN: 250.0000 mL | INTRAVENOUS | Status: DC | PRN

## 2012-11-14 MED ORDER — BUPROPION HCL 75 MG PO TABS
75.0000 mg | ORAL_TABLET | Freq: Two times a day (BID) | ORAL | Status: DC
  Administered 2012-11-14 – 2012-11-15 (×2): 75 mg via ORAL
  Filled 2012-11-14 (×3): qty 1

## 2012-11-14 MED ORDER — ONDANSETRON HCL 4 MG/2ML IJ SOLN
4.0000 mg | Freq: Four times a day (QID) | INTRAMUSCULAR | Status: DC | PRN
Start: 2012-11-14 — End: 2012-11-15

## 2012-11-14 MED ORDER — GUAIFENESIN-DM 100-10 MG/5ML PO SYRP: 5.0000 mL | ORAL_SOLUTION | ORAL | Status: DC | PRN

## 2012-11-14 MED ORDER — ASPIRIN 81 MG PO CHEW
CHEWABLE_TABLET | ORAL | Status: AC
  Filled 2012-11-14: qty 4

## 2012-11-14 MED ORDER — METHYLPREDNISOLONE SODIUM SUCC 125 MG IJ SOLR
125.0000 mg | INTRAMUSCULAR | Status: AC
  Administered 2012-11-14: 125 mg via INTRAVENOUS

## 2012-11-14 MED ORDER — INSULIN ASPART 100 UNIT/ML ~~LOC~~ SOLN
0.0000 [IU] | Freq: Every day | SUBCUTANEOUS | Status: DC
  Administered 2012-11-14: 22:00:00 5 [IU] via SUBCUTANEOUS

## 2012-11-14 MED ORDER — DIPHENHYDRAMINE HCL 50 MG/ML IJ SOLN
INTRAMUSCULAR | Status: AC
  Filled 2012-11-14: qty 1

## 2012-11-14 MED ORDER — FOLIC ACID 1 MG PO TABS
1.0000 mg | ORAL_TABLET | Freq: Every day | ORAL | Status: DC
  Administered 2012-11-14: 1 mg via ORAL
  Filled 2012-11-14 (×2): qty 1

## 2012-11-14 MED ORDER — HYDROCOD POLST-CHLORPHEN POLST 10-8 MG/5ML PO LQCR
5.0000 mL | Freq: Two times a day (BID) | ORAL | Status: DC | PRN
  Administered 2012-11-14: 5 mL via ORAL
  Filled 2012-11-14: qty 5

## 2012-11-14 MED ORDER — QUINAPRIL HCL 10 MG PO TABS
20.0000 mg | ORAL_TABLET | Freq: Every day | ORAL | Status: DC
  Filled 2012-11-14: qty 2

## 2012-11-14 MED ORDER — FENTANYL CITRATE 0.05 MG/ML IJ SOLN
INTRAMUSCULAR | Status: AC
  Filled 2012-11-14: qty 2

## 2012-11-14 MED ORDER — ALPRAZOLAM 0.25 MG PO TABS: 0.5000 mg | ORAL_TABLET | Freq: Three times a day (TID) | ORAL | Status: DC | PRN

## 2012-11-14 MED ORDER — INSULIN ASPART 100 UNIT/ML ~~LOC~~ SOLN
5.0000 [IU] | Freq: Once | SUBCUTANEOUS | Status: AC
  Administered 2012-11-14: 22:00:00 5 [IU] via SUBCUTANEOUS

## 2012-11-14 MED ORDER — SODIUM CHLORIDE 0.9 % IJ SOLN: 3.0000 mL | Freq: Two times a day (BID) | INTRAMUSCULAR | Status: DC

## 2012-11-14 MED ORDER — LIDOCAINE HCL (PF) 1 % IJ SOLN
INTRAMUSCULAR | Status: AC
  Filled 2012-11-14: qty 30

## 2012-11-14 MED ORDER — ACETAMINOPHEN 325 MG PO TABS: 650.0000 mg | ORAL_TABLET | ORAL | Status: DC | PRN

## 2012-11-14 MED ORDER — NICOTINE 14 MG/24HR TD PT24
14.0000 mg | MEDICATED_PATCH | Freq: Every day | TRANSDERMAL | Status: DC
  Administered 2012-11-14 – 2012-11-15 (×2): 14 mg via TRANSDERMAL
  Filled 2012-11-14 (×2): qty 1

## 2012-11-14 MED ORDER — DIPHENHYDRAMINE HCL 50 MG/ML IJ SOLN: 25.0000 mg | INTRAMUSCULAR | Status: DC

## 2012-11-14 MED ORDER — SODIUM CHLORIDE 0.9 % IJ SOLN: 3.0000 mL | INTRAMUSCULAR | Status: DC | PRN

## 2012-11-14 MED ORDER — METHOTREXATE 2.5 MG PO TABS
25.0000 mg | ORAL_TABLET | ORAL | Status: DC
  Filled 2012-11-14: qty 10

## 2012-11-14 NOTE — CV Procedure (Signed)
Cardiac Catheterization/PCI with Cutting Balloon atherotomy/NCPTCA LCX  Robert [name], 62 y.o., male  Full note dictated; see diagram  DICTATION # [id number], [id number]  AO: 120/70 LV: 120/10 Double barrel ostium LAD: 30% proximal, 20 - 30% DX1 stenosis, large vessel with extensive collaterals to RCA. LCX: 80 - 95% focal in-stent restenosis at site of stent overlap RCA: old proximal occlusion  LV: EF 50 -55% with focal mid-basal inferior hypokinesis  PCI: 23F XB 3.5 guide, Intuition wire, 3.75 x 15 Flextone atherotomy, 4.0 x 21 [place] Sprinter post dilated to 4.08 mm to 0 - <10%.  Tolerated well. Smoking cessation discussed at length.  [name], MD, [facility] 11/14/2012 11:55 AM

## 2012-11-14 NOTE — Progress Notes (Signed)
Patient ID: Robert [name], male   DOB: Apr 20, 1951, 62 y.o.   MRN: [id number] Updated H&P: Admit note reviewed from Cr Croitoru. No change in Pex. Labs still pending from this am. Pt has received premedication for hives in past with contrast. Discussed cath and possible PCI with patient and wife as well as need for smoking cessation. Plan this am.

## 2012-11-15 LAB — GLUCOSE, CAPILLARY
Glucose-Capillary: 235 mg/dL — ABNORMAL HIGH (ref 70–99)
Glucose-Capillary: 303 mg/dL — ABNORMAL HIGH (ref 70–99)

## 2012-11-15 LAB — BASIC METABOLIC PANEL
CO2: 25 mEq/L (ref 19–32)
Calcium: 9.4 mg/dL (ref 8.4–10.5)
Creatinine, Ser: 0.81 mg/dL (ref 0.50–1.35)
Glucose, Bld: 244 mg/dL — ABNORMAL HIGH (ref 70–99)

## 2012-11-15 LAB — CBC
HCT: 44.7 % (ref 39.0–52.0)
Hemoglobin: 15.9 g/dL (ref 13.0–17.0)
MCH: 31.7 pg (ref 26.0–34.0)
MCV: 89 fL (ref 78.0–100.0)
RBC: 5.02 MIL/uL (ref 4.22–5.81)

## 2012-11-15 MED ORDER — PRASUGREL HCL 10 MG PO TABS: 10.0000 mg | ORAL_TABLET | Freq: Every day | ORAL | Status: DC

## 2012-11-15 MED ORDER — INSULIN GLARGINE 100 UNIT/ML ~~LOC~~ SOLN: 25.0000 [IU] | Freq: Two times a day (BID) | SUBCUTANEOUS | Status: DC

## 2012-11-15 MED ORDER — METFORMIN HCL 1000 MG PO TABS: 1000.0000 mg | ORAL_TABLET | Freq: Two times a day (BID) | ORAL | Status: DC

## 2012-11-15 MED ORDER — OMEGA-3-ACID ETHYL ESTERS 1 G PO CAPS: 1.0000 g | ORAL_CAPSULE | Freq: Two times a day (BID) | ORAL | Status: DC

## 2012-11-15 MED ORDER — NICOTINE 14 MG/24HR TD PT24: 1.0000 | MEDICATED_PATCH | Freq: Every day | TRANSDERMAL | Status: DC

## 2012-11-15 MED ORDER — ASPIRIN 81 MG PO TBEC: 81.0000 mg | DELAYED_RELEASE_TABLET | Freq: Every day | ORAL | Status: DC

## 2012-11-15 NOTE — Progress Notes (Addendum)
[facility]  Subjective: No complaints.  Objective: Vital signs in last 24 hours: Temp:  [97.2 F (36.2 C)-97.8 F (36.6 C)] 97.8 F (36.6 C) (03/07 0729) Pulse Rate:  [76-105] 77 (03/07 0729) Resp:  [18-19] 18 (03/07 0729) BP: (98-168)/(54-91) 134/70 mmHg (03/07 0729) SpO2:  [89 %-99 %] 95 % (03/07 0729) Weight:  [104 kg (229 lb 4.5 oz)] 104 kg (229 lb 4.5 oz) (03/07 0100) Last BM Date: 11/13/12  Intake/Output from previous day: 03/06 0701 - 03/07 0700 In: 1065 [P.O.:340; I.V.:725] Out: 1775 [Urine:1775] Intake/Output this shift: Total I/O In: 240 [P.O.:240] Out: -   Medications Current Facility-Administered Medications  Medication Dose Route Frequency Provider Last Rate Last Dose  . 0.9 %  sodium chloride infusion   Intravenous Continuous Robert Bihari, MD 150 mL/hr at 11/14/12 2000    . acetaminophen (TYLENOL) tablet 650 mg  650 mg Oral Q4H PRN Robert Bihari, MD      . ALPRAZolam Prudy Feeler) tablet 0.5 mg  0.5 mg Oral TID PRN Robert Derrick, PA-C      . aspirin EC tablet 81 mg  81 mg Oral Daily Robert Bihari, MD   81 mg at 11/15/12 0955  . buPROPion [facility]) tablet 75 mg  75 mg Oral BID Robert Paschal Kilroy, PA-C   75 mg at 11/15/12 0954  . chlorpheniramine-HYDROcodone (TUSSIONEX) 10-8 MG/5ML suspension 5 mL  5 mL Oral Q12H PRN Robert Derrick, PA-C   5 mL at 11/14/12 1828  . folic acid (FOLVITE) tablet 1 mg  1 mg Oral QHS Robert Paschal [place], PA-C   1 mg at 11/14/12 2157  . furosemide (LASIX) tablet 20 mg  20 mg Oral BID Robert Derrick, PA-C   20 mg at 11/15/12 0954  . glipiZIDE (GLUCOTROL XL) 24 hr tablet 10 mg  10 mg Oral BID Robert Derrick, PA-C   10 mg at 11/15/12 0954  . guaiFENesin-dextromethorphan (ROBITUSSIN DM) 100-10 MG/5ML syrup 5 mL  5 mL Oral Q4H PRN Robert Bihari, MD      . insulin aspart (novoLOG) injection 0-15 Units  0-15 Units Subcutaneous TID WC Robert Derrick, PA-C   5 Units at 11/15/12 0800  . insulin aspart (novoLOG) injection 0-5  Units  0-5 Units Subcutaneous QHS Robert Paschal [place], PA-C   5 Units at 11/14/12 2156  . insulin glargine (LANTUS) injection 20 Units  20 Units Subcutaneous BID Robert Derrick, PA-C   20 Units at 11/15/12 [phone]  . isosorbide mononitrate (IMDUR) 24 hr tablet 60 mg  60 mg Oral Daily Robert Derrick, PA-C   60 mg at 11/15/12 0954  . [START ON 11/18/2012] metFORMIN (GLUCOPHAGE) tablet 1,000 mg  1,000 mg Oral BID WC Robert K Kilroy, PA-C      . methotrexate (RHEUMATREX) tablet 25 mg  25 mg Oral Weekly Robert K Kilroy, PA-C      . metoprolol tartrate (LOPRESSOR) tablet 75 mg  75 mg Oral BID Robert Paschal Kilroy, PA-C   75 mg at 11/15/12 0954  . nicotine (NICODERM CQ - dosed in mg/24 hours) patch 14 mg  14 mg Transdermal Daily Robert Derrick, PA-C   14 mg at 11/15/12 0955  . omega-3 acid ethyl esters (LOVAZA) capsule 1 g  1 g Oral BID Robert Bihari, MD   1 g at 11/15/12 0954  . ondansetron (ZOFRAN) injection 4 mg  4 mg Intravenous Q6H PRN Robert Bihari, MD      .  prasugrel (EFFIENT) tablet 10 mg  10 mg Oral Daily Robert Bihari, MD   10 mg at 11/15/12 0954  . quinapril (ACCUPRIL) tablet 20 mg  20 mg Oral QHS Robert K Kilroy, PA-C      . simvastatin (ZOCOR) tablet 40 mg  40 mg Oral QPM Robert Derrick, PA-C   40 mg at 11/14/12 1824  . traMADol (ULTRAM) tablet 50 mg  50 mg Oral Q6H PRN Robert Derrick, PA-C      . zolpidem (AMBIEN) tablet 10 mg  10 mg Oral QHS PRN Robert Derrick, PA-C        PE: General appearance: alert, cooperative and no distress Lungs: clear to auscultation bilaterally Heart: regular rate and rhythm, S1, S2 normal, no murmur, click, rub or gallop Extremities: No LEE Pulses: 2+ and symmetric 0+ DPs and PT.  Feet warm. Skin: Minimal tenderness at cath site.  No hematoma or ecchymosis. Neurologic: Grossly normal  Lab Results:   Recent Labs  11/14/12 0856 11/15/12 0753  WBC 9.3 12.8*  HGB 16.8 15.9  HCT 47.2 44.7  PLT 175 159   BMET  Recent Labs  11/14/12 0856 11/15/12 0753  NA 135 136  K  4.4 4.1  CL 101 100  CO2 21 25  GLUCOSE 315* 244*  BUN 14 14  CREATININE 0.74 0.81  CALCIUM 9.8 9.4   PT/INR  Recent Labs  11/14/12 0856  LABPROT 13.4  INR 1.03   Cholesterol No results found for this basename: CHOL,  in the last 72 hours Cardiac Enzymes No components found with this basename: TROPONIN,  [facility],   Studies/Results: DICTATION # [id number], [id number]  AO: 120/70  LV: 120/10  Double barrel ostium  LAD: 30% proximal, 20 - 30% DX1 stenosis, large vessel with extensive collaterals to RCA.  LCX: 80 - 95% focal in-stent restenosis at site of stent overlap  RCA: old proximal occlusion  LV: EF 50 -55% with focal mid-basal inferior hypokinesis   PCI: 25F XB 3.5 guide, Intuition wire, 3.75 x 15 Flextone atherotomy, 4.0 x 21 [place] Sprinter post dilated to 4.08 mm to 0 - <10%.  Tolerated well. Smoking cessation discussed at length.  Robert Bihari, MD, [facility]  11/14/2012    Assessment/Plan  Principal Problem:   Unstable angina Active Problems:   Type II IDDM   HTN (hypertension)   Tobacco abuse   CAD, MI '95, CFX stent '04, CFX DES 9/06, CFX DES 8/07 with OM POBA   PVD, chronic LLE   Contrast media allergy   S/P angioplasty with stent of LCX 05/16/12 with expedition DES  Plan:  SP Flextone arthrotomy to ISRS in the Circumflex.   WBC elevation likely from steroids given due to contrast allergy.  PVCs on tele.  BP and HR controlled and stable.  OK for DC home today.  Tobacco cessation discussed.     LOS: 1 day    Robert [name], Robert [name] 11/15/2012 10:03 AM  I have seen and evaluated the patient this PM along with Robert Finlay, PA. I agree with his findings, examination as well as impression recommendations.  Doing well post PTCA of ISR for Unstable Angina.  No further Angina.    DAPT - ASA & Effient per Dr. Wonda [name] & Robert [name]. Ok for d/c on home medications, but with quite high CBGs will need f/u with PCP for medication adjustment - Metformin on hold x 48 hr post cath.  Will get DM  Education team to discuss options.  I suspect  with Prednisone & no Lantus leading into las PM, that his sugars may be elevated for a short term -- consider increasing home Lantus dose to at least 25 Units BID for at least a few days.  Will need hospital f/u with Advanced Practitioner in ~1-2 weeks, then with Dr. Salena Saner in 1-2 months.   Marykay Lex, M.D., M.S. [facility] [address] [place], [place]  [address]  [phone] Pager # [phone] 11/15/2012 12:40 PM

## 2012-11-15 NOTE — Cardiovascular Report (Signed)
Robert [name], Robert [name] NO.:  [id number]  MEDICAL RECORD NO.:  [id number]  LOCATION:  6523                         FACILITY:  [facility]  PHYSICIAN:  Nicki Guadalajara, M.D.     DATE OF BIRTH:  09-07-1951  DATE OF PROCEDURE:  11/14/2012 DATE OF DISCHARGE:                           CARDIAC CATHETERIZATION   INDICATIONS:  [name] is a 62 year old gentleman, who has established coronary artery disease with previous documentation of total RCA occlusion with left-to-right collaterals.  He has undergone multiple stents placed in his proximal to mid left circumflex coronary artery. His last intervention was done in September 2013, after he developed restenosis 6 months after cutting balloon and a 3.25 x 23 mm Xience expedition drug-eluting stent was inserted at this site.  With post stent dilatation with a 3.75 x 12 mm noncompliant balloon.  The patient had done well initially.  However, he again has resumed smoking cigarettes.  He does have a history of diabetes mellitus, hypertension. Recently has developed increasing episodes of exertional shortness of breath.  He was seen yesterday in our regional office by Dr. Royann Shivers and due to symptom complex suggesting again restenosis, he is now scheduled for catheterization with possible intervention.  PROCEDURE:  After premedication with Versed 2 mg plus fentanyl 50 mcg, the patient was prepped and draped in the usual fashion.  His right femoral artery was punctured anteriorly and a 5-French sheath was inserted without difficulty.  The patient has a "double-barrel" common ostium left main and an FL4, 5-French diagnostic catheter was used for selective angiography into the left circumflex coronary artery and a 5- [place] FL3.5 diagnostic catheter was used for selective angiography into the LAD.  A right catheter was used for selective angiography into the previously documented total RCA occlusion.  A 5-French pigtail  catheter was used for RAO ventriculography.  With the demonstration of again restenosis in what appeared to be a very large vessel, perhaps at least greater than 4 mm, the decision was made to attempt to reintervention. The sheath was upgraded to a 6-French system.  Angiomax double bolus plus infusion was administered and ACT was documented therapeutic.  The patient was given his daily dose of Effient 10 mg in the laboratory.  IC nitroglycerin was also administered on several occasions down selectively into the left circumflex vessel.  A 6-French XB 3.5 guide was used for the intervention, and an intuition wire was advanced down the distal circumflex.  A 3.75 x 15 mm Flextome Cutting Balloon atherotomy catheter was then used and multiple scoring cuts were made within the stented segment up to approximately up to 11 atmospheres.  A 4.0 x 21 mm noncompliant Sprinter balloon was then used for post stent dilatation with dilatation up to 4.08 mm.  With each balloon inflation the patient did experience his chest discomfort.  Scout angiography confirmed an excellent angiographic result.  The Angiomax just completed at the end of the procedure.  The arterial sheath was sutured in place with plans for sheath removal in 2 hours.  He tolerated the procedure well.  HEMODYNAMIC DATA:  Central aortic pressure 120/70.  Left ventricle pressure 120/10.  ANGIOGRAPHIC DATA:  The  left main vessel essentially was a double-barrel common ostium which immediately gave rise to the LAD and circumflex vessels.  The LAD had 30% irregularity proximally in the region of the diagonal vessel and also in the 20% to 30% in this proximal first diagonal vessel.  The LAD gave rise to several septal perforating arteries, extended around the LV apex, and significantly supplied collaterals to the distal right coronary artery  The circumflex vessel again had a common ostium.  There was a long area of stented segment with  obvious stent overlap in the mid segment at the segment proximally.  There again was 80% and followed by 95% focal in- stent restenosis.  The distal circumflex was large caliber and gave rise to several additional marginal vessels.  The right coronary artery was totally occluded proximally and had extensive left-to-right collaterals.  RAO ventriculography revealed an ejection fraction of approximately 50% to 55%.  Focal mid inferior hypocontractility.  Following percutaneous coronary intervention with aggressive cutting balloon atherotomy utilizing a 3.75 x 15 mm Flextome atherotomy Cutting Balloon catheter with noncompliant post stent dilatation up to 4.08 mm, the in-stent restenosis was reduced to 0 to less than 10%.  There was brisk TIMI-3 flow.  There was no evidence for dissection.  IMPRESSION: 1. Low normal LV contractility with an ejection fraction of 50% to 55%     with focal mild mid inferior hypocontractility. 2. Mild luminal irregularity with 30% narrowing in the LAD proximally     with 20% to 30% narrowing in the first diagonal vessel; in-stent     restenosis of 80% to 95% in the overlapped segment of the     previously placed proximal circumflex stent and known total     proximal RCA occlusion with extensive left-to-right collaterals. 3. Successful percutaneous intervention to the left circumflex     coronary artery, utilizing cutting balloon atherotomy/noncompliant     balloon dilatation up to 4.08 mm with the 80% to 95% stenosis being     reduced to 0-less than 10%. 4. Angiomax/Effient/IC nitroglycerin. 5. Smoking cessation consultation was again discussed in detail with     the patient.          ______________________________ Nicki Guadalajara, M.D.     TK/MEDQ  D:  11/14/2012  T:  11/15/2012  Job:  [id number]  cc:   Thurmon Fair, MD [facility]

## 2012-11-15 NOTE — Progress Notes (Addendum)
Walking independently. Sts he had some SOB but no chest tightness. Ed completed. Pt wants to quit smoking and he and his wife are trying hard with patches. Long discussion and encouragement in this. Gave 1800quitnow. Discussed carb counting. Pt sts he cannot ex formally due to RA but tries to be active.  [id number] Ethelda Chick CES, ACSM

## 2012-11-18 MED FILL — Dextrose Inj 5%: INTRAVENOUS | Qty: 50 | Status: AC

## 2012-11-18 NOTE — Discharge Summary (Signed)
Physician Discharge Summary  Patient ID: Robert [name] MRN: [id number] DOB/AGE: 1950-09-21 62 y.o.  Admit date: 11/14/2012 Discharge date: 11/18/2012  Admission Diagnoses:  Unstable angina  Discharge Diagnoses:  Principal Problem:   Unstable angina Active Problems:   Type II IDDM   HTN (hypertension)   Tobacco abuse   CAD, MI '95, CFX stent '04, CFX DES 9/06, CFX DES 8/07 with OM POBA   PVD, chronic LLE   Contrast media allergy   S/P angioplasty with stent of LCX 05/16/12 with expedition DES    Discharged Condition: stable  Hospital Course:   This is a 62 y.o. male with a past medical history significant for tobacco abuse, CAD and repeated LCX PCI, returns with symptoms similar to prior presentations: exertional dyspnea and exertional chest tightness relieved by rest, in a slowly accelerating pattern over last month. Echo shows normal LVEF with mild inferolateral hypokinesis and mild diastolic dysfunction. Diuretics have not led to any improvement.  -cath 2004 showed total occlusion of RCA and intermediate lesions LAD, high grade mid LCX treated with 4x18 mm BMS (Velocity).  - 2006 overlapping 3.5x23 mm Cypher inLCX  - 2007 new downstream stenosis at OM bifurcation treated with 3x18 mm Cypher stent  - 2008 acute stent thrombosis (allergic to plavix, temporarily stopped Ticlid) - thrombolysis/aspiration only  - April 2013 cutting balloon angioplasty for proximal LCX in stent restenosis (Dr. Tresa Endo)  - September 2013 repeat restenosis treated with "stent in stent" Xience Expedition 3.25 x 23 mm (Dr. Allyson Sabal). No change in RCA or LAD anatomy during last several years.  Unfortunately he continues to smoke.  Mediocre glycemic control despite insulin. Good lipids except minimal elevation in TG. LDL in 70s.  Allergic to iodinated contrast, but did well with premedication. Allergic to clopidogrel but tolerates Effient well.  The patient was admitted and scheduled for coronary angiogram.  IV  Solumedrol was given for contrast allergy.  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  This was treated with flextone atherotomy(see full cath report).   Smoking cessation was discussed in detail.  He was given ASA and Effient along with a 30 day prescription for free Effient.  He was discharged home in stable condition after being seen by Dr. [name].  The patient's Lantus dose was increased for two days following discharge with instructions to resume previous home dose and Metformin.  Follow-up arranged.  Consults: Cardiac Rehab  Significant Diagnostic Studies:   Robert [name], 62 y.o., male  Full note dictated; see diagram  DICTATION # [id number], [id number]  AO: 120/70  LV: 120/10  Double barrel ostium  LAD: 30% proximal, 20 - 30% DX1 stenosis, large vessel with extensive collaterals to RCA.  LCX: 80 - 95% focal in-stent restenosis at site of stent overlap  RCA: old proximal occlusion  LV: EF 50 -55% with focal mid-basal inferior hypokinesis  PCI: 8F XB 3.5 guide, Intuition wire, 3.75 x 15 Flextone atherotomy, 4.0 x 21  Sprinter post dilated to 4.08 mm to 0 - <10%.  Tolerated well. Smoking cessation discussed at length.  [name], MD, [facility]  11/14/2012  CBC    Component Value Date/Time   WBC 12.8* 11/15/2012 0753   RBC 5.02 11/15/2012 0753   HGB 15.9 11/15/2012 0753   HCT 44.7 11/15/2012 0753   PLT 159 11/15/2012 0753   MCV 89.0 11/15/2012 0753   MCH 31.7 11/15/2012 0753   MCHC 35.6 11/15/2012 0753   RDW 14.8 11/15/2012 0753  LYMPHSABS 1.5 05/10/2012 1045   MONOABS 0.6 05/10/2012 1045   EOSABS 0.1 05/10/2012 1045   BASOSABS 0.0 05/10/2012 1045    BMET    Component Value Date/Time   NA 136 11/15/2012 0753   K 4.1 11/15/2012 0753   CL 100 11/15/2012 0753   CO2 25 11/15/2012 0753   GLUCOSE 244* 11/15/2012 0753   BUN 14 11/15/2012 0753   CREATININE 0.81 11/15/2012 0753   CALCIUM 9.4 11/15/2012 0753   GFRNONAA >90 11/15/2012 0753   GFRAA >90 11/15/2012 0753   Lipid Panel      Component Value Date/Time   CHOL 146 12/22/2011 0935   TRIG 175* 12/22/2011 0935   HDL 34* 12/22/2011 0935   CHOLHDL 4.3 12/22/2011 0935   VLDL 35 12/22/2011 0935   LDLCALC 77 12/22/2011 0935   Treatments: See above.  Discharge Exam: Blood pressure 123/75, pulse 72, temperature 97.7 F (36.5 C), temperature source Oral, resp. rate 18, height 6' (1.829 m), weight 104 kg (229 lb 4.5 oz), SpO2 96.00%.   Disposition: 01-Home or Self Care  Discharge Orders   Future Orders Complete By Expires     Diet - low sodium heart healthy  As directed     Discharge instructions  As directed     Comments:      No lifting more than a half gallon of milk or driving until Monday.    Increase activity slowly  As directed         Medication List    STOP taking these medications       aspirin 325 MG tablet      TAKE these medications       ALPRAZolam 0.5 MG tablet  Commonly known as:  XANAX  Take 0.5 mg by mouth 3 (three) times daily as needed. For anxiety     aspirin 81 MG EC tablet  Take 1 tablet (81 mg total) by mouth daily.     buPROPion 75 MG tablet  Commonly known as:  WELLBUTRIN  Take 75 mg by mouth 2 (two) times daily.     cilostazol 100 MG tablet  Commonly known as:  PLETAL  Take 100 mg by mouth 2 (two) times daily.     fish oil-omega-3 fatty acids 1000 MG capsule  Take 1 g by mouth 2 (two) times daily.     folic acid 1 MG tablet  Commonly known as:  FOLVITE  Take 1 mg by mouth at bedtime.     furosemide 20 MG tablet  Commonly known as:  LASIX  Take 20 mg by mouth 2 (two) times daily.     glipiZIDE 10 MG 24 hr tablet  Commonly known as:  GLUCOTROL XL  Take 10 mg by mouth 2 (two) times daily.     insulin glargine 100 UNIT/ML injection  Commonly known as:  LANTUS SOLOSTAR  Inject 25 Units into the skin 2 (two) times daily.     isosorbide mononitrate 60 MG 24 hr tablet  Commonly known as:  IMDUR  Take 60 mg by mouth daily.     metFORMIN 1000 MG tablet   Commonly known as:  GLUCOPHAGE  Take 1 tablet (1,000 mg total) by mouth 2 (two) times daily with a meal.     methotrexate 2.5 MG tablet  Commonly known as:  RHEUMATREX  Take 25 mg by mouth once a week. Caution:Chemotherapy. Protect from light. ON THURSDAYS     metoprolol 50 MG tablet  Commonly known as:  LOPRESSOR  Take 75 mg by mouth 2 (two) times daily.     nicotine 14 mg/24hr patch  Commonly known as:  NICODERM CQ - dosed in mg/24 hours  Place 1 patch onto the skin daily.     omega-3 acid ethyl esters 1 G capsule  Commonly known as:  LOVAZA  Take 1 capsule (1 g total) by mouth 2 (two) times daily.     prasugrel 10 MG Tabs  Commonly known as:  EFFIENT  Take 1 tablet (10 mg total) by mouth daily.     quinapril 20 MG tablet  Commonly known as:  ACCUPRIL  Take 20 mg by mouth at bedtime.     simvastatin 40 MG tablet  Commonly known as:  ZOCOR  Take 40 mg by mouth every evening.         SignedWilburt [name] 11/18/2012, 9:29 AM  I saw & examined the patient on the morning of discharge.  I agree with the discharge summary above.  Marykay Lex, M.D., M.S. [facility] [address] [place], [place]  [address]  [phone] Pager # [phone] 11/18/2012 10:06 PM

## 2013-02-14 ENCOUNTER — Telehealth: Payer: Self-pay | Admitting: Cardiovascular Disease

## 2013-02-14 NOTE — Telephone Encounter (Signed)
Please call-need to discuss his visit with the doctor  he saw  in [place] yesterday!

## 2013-02-14 NOTE — Telephone Encounter (Signed)
Pt was seen by Dr. Greer Pickerel in [place] for a consultation about the intracoronary brachytherapy due to his left circumflex coronary "stent sandwich", and high risk to develop in-stent restenosis. Dr. Greer Pickerel stated that this procedure had not been performed in the [facility] since roughly 2010. He also told the patient that he wasn't sure of anyone in the area that performed this procedure, and that the physician who had been doing them relocated to [facility]. I assured the patient that Dr.Croitoru would be notified and then he will be notified of  any further suggestions.

## 2013-02-14 NOTE — Telephone Encounter (Signed)
Please make sure he has a followup appointment within the next couple of months

## 2013-03-21 ENCOUNTER — Other Ambulatory Visit: Payer: Self-pay | Admitting: Cardiovascular Disease

## 2013-03-21 NOTE — Telephone Encounter (Signed)
Rx printed for [facility] to sign. Once signed will fax over to [facility].

## 2013-03-24 ENCOUNTER — Other Ambulatory Visit: Payer: Self-pay

## 2013-03-24 MED ORDER — BUPROPION HCL 75 MG PO TABS: 75.0000 mg | ORAL_TABLET | Freq: Two times a day (BID) | ORAL | Status: DC

## 2013-03-24 NOTE — Telephone Encounter (Signed)
Rx was sent to pharmacy electronically. 

## 2013-04-03 ENCOUNTER — Encounter (HOSPITAL_COMMUNITY): Payer: Self-pay | Admitting: *Deleted

## 2013-04-03 ENCOUNTER — Emergency Department (HOSPITAL_COMMUNITY)
Admission: EM | Admit: 2013-04-03 | Discharge: 2013-04-03 | Disposition: A | Discharge status: Home / Self Care | Payer: Medicare Other | Attending: Emergency Medicine | Admitting: Emergency Medicine

## 2013-04-03 ENCOUNTER — Emergency Department (HOSPITAL_COMMUNITY): Payer: Medicare Other

## 2013-04-03 DIAGNOSIS — Z79899 Other long term (current) drug therapy: Secondary | ICD-10-CM | POA: Insufficient documentation

## 2013-04-03 DIAGNOSIS — Z8679 Personal history of other diseases of the circulatory system: Secondary | ICD-10-CM | POA: Insufficient documentation

## 2013-04-03 DIAGNOSIS — R42 Dizziness and giddiness: Secondary | ICD-10-CM

## 2013-04-03 DIAGNOSIS — I739 Peripheral vascular disease, unspecified: Secondary | ICD-10-CM | POA: Insufficient documentation

## 2013-04-03 DIAGNOSIS — I252 Old myocardial infarction: Secondary | ICD-10-CM | POA: Insufficient documentation

## 2013-04-03 DIAGNOSIS — Z7982 Long term (current) use of aspirin: Secondary | ICD-10-CM | POA: Insufficient documentation

## 2013-04-03 DIAGNOSIS — I509 Heart failure, unspecified: Secondary | ICD-10-CM | POA: Insufficient documentation

## 2013-04-03 DIAGNOSIS — I10 Essential (primary) hypertension: Secondary | ICD-10-CM | POA: Insufficient documentation

## 2013-04-03 DIAGNOSIS — E119 Type 2 diabetes mellitus without complications: Secondary | ICD-10-CM | POA: Insufficient documentation

## 2013-04-03 DIAGNOSIS — Z9861 Coronary angioplasty status: Secondary | ICD-10-CM | POA: Insufficient documentation

## 2013-04-03 DIAGNOSIS — I2510 Atherosclerotic heart disease of native coronary artery without angina pectoris: Secondary | ICD-10-CM | POA: Insufficient documentation

## 2013-04-03 DIAGNOSIS — F17200 Nicotine dependence, unspecified, uncomplicated: Secondary | ICD-10-CM | POA: Insufficient documentation

## 2013-04-03 DIAGNOSIS — E7800 Pure hypercholesterolemia, unspecified: Secondary | ICD-10-CM | POA: Insufficient documentation

## 2013-04-03 DIAGNOSIS — M069 Rheumatoid arthritis, unspecified: Secondary | ICD-10-CM | POA: Insufficient documentation

## 2013-04-03 DIAGNOSIS — Z8709 Personal history of other diseases of the respiratory system: Secondary | ICD-10-CM | POA: Insufficient documentation

## 2013-04-03 DIAGNOSIS — Z794 Long term (current) use of insulin: Secondary | ICD-10-CM | POA: Insufficient documentation

## 2013-04-03 LAB — BASIC METABOLIC PANEL
BUN: 9 mg/dL (ref 6–23)
CO2: 23 mEq/L (ref 19–32)
Calcium: 9.2 mg/dL (ref 8.4–10.5)
Chloride: 102 mEq/L (ref 96–112)
Creatinine, Ser: 0.7 mg/dL (ref 0.50–1.35)
GFR calc Af Amer: >90 mL/min (ref >90)
GFR calc non Af Amer: >90 mL/min (ref >90)
Glucose, Bld: 245 mg/dL — ABNORMAL HIGH (ref 70–99)
Potassium: 3.8 mEq/L (ref 3.5–5.1)
Sodium: 135 mEq/L (ref 135–145)

## 2013-04-03 LAB — CBC
HCT: 47.3 % (ref 39.0–52.0)
Hemoglobin: 16.6 g/dL (ref 13.0–17.0)
MCH: 32.5 pg (ref 26.0–34.0)
MCHC: 35.1 g/dL (ref 30.0–36.0)
MCV: 92.7 fL (ref 78.0–100.0)
Platelets: 104 10*3/uL — ABNORMAL LOW (ref 150–400)
RBC: 5.1 MIL/uL (ref 4.22–5.81)
RDW: 15.6 % — ABNORMAL HIGH (ref 11.5–15.5)
WBC: 8.3 10*3/uL (ref 4.0–10.5)

## 2013-04-03 LAB — TROPONIN I
Troponin I: <0.3 ng/mL (ref <0.30)
Troponin I: <0.3 ng/mL (ref <0.30)

## 2013-04-03 MED ORDER — SODIUM CHLORIDE 0.9 % IV BOLUS (SEPSIS)
1000.0000 mL | Freq: Once | INTRAVENOUS | Status: AC
  Administered 2013-04-03: 1000 mL via INTRAVENOUS

## 2013-04-03 NOTE — ED Notes (Signed)
Pt states he has left CP for brief moment, denies any pain at this time, pt did experience dizziness with sitting up from laying down today, denies N/V, SOB at times but pt thinks it may be from smoking-denies dx of COPD but admits to smoking cigarettes

## 2013-04-03 NOTE — ED Notes (Signed)
Per EMS - called out for cp, pt denies cp.  States is experiencing increased dizziness, worse with position changes.  Pt reports taking Nitro SL x 1 and ASA 325mg  prior to EMS arrival.  CBG en route 218, EMS orthostatics en route lying bp 145/86 HR 78, sitting BP 156/91 HR 82, standing BP 163/93 HR 84.

## 2013-04-03 NOTE — ED Notes (Signed)
MD at bedside. 

## 2013-04-07 NOTE — ED Provider Notes (Signed)
CSN: [id number]     Arrival date & time 04/03/13  1148 History     First MD Initiated Contact with Patient 04/03/13 1307     Chief Complaint  Patient presents with  . Dizziness   (Consider location/radiation/quality/duration/timing/severity/associated sxs/prior Treatment)  61yM with dizziness.HAs been going on for the past few days but noticeably worse today.  Sensation that may pass out. Noticed particularly with changed in position. Denies and CP or SOB. Does have a cardiac history and tried taking a nitroglycerin SL x1 which only made symptoms worse. Currently no complaints. No fever or chills. No cough. No unusual leg pain or swelling. Reports compliance with medications.   The history is provided by the patient. No language interpreter was used.    Past Medical History  Diagnosis Date  . Hypertension   . Coronary artery disease   . Dysrhythmia   . CHF (congestive heart failure)   . Unstable angina 12/22/2011  . PVD, chronic LLE 12/22/2011  . High cholesterol   . 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 Chronic bronchitis     "maybe once/yr; last time 04/2012" (05/16/2012)  . Shortness of breath 05/16/2012    "@ rest, lying down, w/exertion"  . Type II diabetes mellitus   . Rheumatoid arthritis(714.0)     "qwhere on my body"  . Abnormal nuclear stress test 05/17/2012  . S/P angioplasty with stent of LCX 05/16/12 with expedition DES 05/17/2012   Past Surgical History  Procedure Laterality Date  . Back surgery    . Cholecystectomy  ~1995  . Coronary angioplasty  11/14/2012  . Coronary angioplasty with stent placement  05/16/2012    "1; makes total ~ 4"   Family History  Problem Relation Age of Onset  . Colon cancer Father    History  Substance Use Topics  . Smoking status: Current Every Day Smoker -- 1.00 packs/day for 41 years    Types: Cigarettes  . Smokeless tobacco: Never Used  . Alcohol Use: No    Review of  Systems  All systems reviewed and negative, other than as noted in HPI.   Allergies  Ivp dye and Plavix  Home Medications   Current Outpatient Rx  Name  Route  Sig  Dispense  Refill  . ALPRAZolam (XANAX) 0.5 MG tablet   Oral   Take 0.5 mg by mouth 3 (three) times daily as needed. For anxiety         . aspirin EC 81 MG EC tablet   Oral   Take 1 tablet (81 mg total) by mouth daily.         [name] buPROPion (WELLBUTRIN) 75 MG tablet   Oral   Take 1 tablet (75 mg total) by mouth 2 (two) times daily.   60 tablet   9   . cilostazol (PLETAL) 100 MG tablet   Oral   Take 100 mg by mouth 2 (two) times daily.           . fish oil-omega-3 fatty acids 1000 MG capsule   Oral   Take 1 g by mouth daily.         . folic acid (FOLVITE) 1 MG tablet   Oral   Take 1 mg by mouth at bedtime.          . furosemide (LASIX) 20 MG tablet   Oral   Take 20 mg by mouth  2 (two) times daily as needed for fluid.          [name] glipiZIDE (GLUCOTROL XL) 10 MG 24 hr tablet   Oral   Take 10 mg by mouth 2 (two) times daily.         . insulin glargine (LANTUS) 100 UNIT/ML injection   Subcutaneous   Inject 20 Units into the skin 2 (two) times daily.         . isosorbide mononitrate (IMDUR) 60 MG 24 hr tablet   Oral   Take 60 mg by mouth daily.         . metFORMIN (GLUCOPHAGE) 1000 MG tablet   Oral   Take 1 tablet (1,000 mg total) by mouth 2 (two) times daily with a meal.           HOLD TILL Monday 4/15   . methotrexate (RHEUMATREX) 2.5 MG tablet   Oral   Take 25 mg by mouth once a week. Caution:Chemotherapy. Protect from light. ON THURSDAYS         . metoprolol (LOPRESSOR) 50 MG tablet   Oral   Take 75 mg by mouth 2 (two) times daily.          . prasugrel (EFFIENT) 10 MG TABS   Oral   Take 1 tablet (10 mg total) by mouth daily.   30 tablet   10   . quinapril (ACCUPRIL) 20 MG tablet   Oral   Take 20 mg by mouth at bedtime.          . simvastatin (ZOCOR) 40 MG  tablet   Oral   Take 40 mg by mouth every evening.         . Tocilizumab 162 MG/0.9ML SOSY   Subcutaneous   Inject 162 mg into the skin once a week. On tuesdays.          BP 126/74  Pulse 73  Temp(Src) 98.5 F (36.9 C) (Oral)  Resp 18  Ht 6' (1.829 m)  Wt 238 lb (107.956 kg)  BMI 32.27 kg/m2  SpO2 93% Physical Exam  Nursing note and vitals reviewed. Constitutional: He appears well-developed and well-nourished. No distress.  HENT:  Head: Normocephalic and atraumatic.  Symptoms not reproducible with dix hallpike maneuvers  Eyes: Conjunctivae and EOM are normal. Pupils are equal, round, and reactive to light. Right eye exhibits no discharge. Left eye exhibits no discharge.  No nystagmus.   Neck: Neck supple.  Cardiovascular: Normal rate, regular rhythm and normal heart sounds.  Exam reveals no gallop and no friction rub.   No murmur heard. Pulmonary/Chest: Effort normal and breath sounds normal. No respiratory distress.  Abdominal: Soft. He exhibits no distension. There is no tenderness.  Musculoskeletal: He exhibits no edema and no tenderness.  Lower extremities symmetric as compared to each other. No calf tenderness. Negative Homan's. No palpable cords.   Neurological: He is alert. No cranial nerve deficit. He exhibits normal muscle tone. Coordination normal.  Skin: Skin is warm and dry. He is not diaphoretic.  Psychiatric: He has a normal mood and affect. His behavior is normal. Thought content normal.    ED Course   Procedures (including critical care time)  Labs Reviewed  CBC - Abnormal; Notable for the following:    RDW 15.6 (*)    Platelets 104 (*)    All other components within normal limits  BASIC METABOLIC PANEL - Abnormal; Notable for the following:    Glucose, Bld 245 (*)    All  other components within normal limits  TROPONIN I  TROPONIN I   EKG:  Rhythm: normal sinus Vent. rate 78 BPM PR interval 162 ms QRS duration 96 ms QT/QTc 380/433 ms ST  segments: NS ST changes   No results found. 1. Dizziness     MDM    Raeford Razor, MD 04/09/13 1402

## 2013-05-11 ENCOUNTER — Encounter: Payer: Self-pay | Admitting: *Deleted

## 2013-05-15 ENCOUNTER — Encounter: Payer: Self-pay | Admitting: Cardiovascular Disease

## 2013-05-15 ENCOUNTER — Ambulatory Visit: Payer: Medicare Other | Admitting: Cardiovascular Disease

## 2013-05-16 ENCOUNTER — Ambulatory Visit (INDEPENDENT_AMBULATORY_CARE_PROVIDER_SITE_OTHER): Payer: Medicare Other | Admitting: Cardiovascular Disease

## 2013-05-16 ENCOUNTER — Encounter: Payer: Self-pay | Admitting: Cardiovascular Disease

## 2013-05-16 VITALS — BP 130/66 | Resp 20 | Ht 72.0 in | Wt 239.4 lb

## 2013-05-16 DIAGNOSIS — Z95820 Peripheral vascular angioplasty status with implants and grafts: Secondary | ICD-10-CM

## 2013-05-16 DIAGNOSIS — F17200 Nicotine dependence, unspecified, uncomplicated: Secondary | ICD-10-CM

## 2013-05-16 DIAGNOSIS — Z720 Tobacco use: Secondary | ICD-10-CM

## 2013-05-16 DIAGNOSIS — I701 Atherosclerosis of renal artery: Secondary | ICD-10-CM

## 2013-05-16 DIAGNOSIS — I2510 Atherosclerotic heart disease of native coronary artery without angina pectoris: Secondary | ICD-10-CM

## 2013-05-16 DIAGNOSIS — Z9889 Other specified postprocedural states: Secondary | ICD-10-CM

## 2013-05-16 DIAGNOSIS — I10 Essential (primary) hypertension: Secondary | ICD-10-CM

## 2013-05-16 DIAGNOSIS — E119 Type 2 diabetes mellitus without complications: Secondary | ICD-10-CM

## 2013-05-16 MED ORDER — PRASUGREL HCL 10 MG PO TABS: 10.0000 mg | ORAL_TABLET | Freq: Every day | ORAL | Status: DC

## 2013-05-16 MED ORDER — QUINAPRIL HCL 40 MG PO TABS: 40.0000 mg | ORAL_TABLET | Freq: Every day | ORAL | Status: DC

## 2013-05-16 NOTE — Patient Instructions (Addendum)
Your physician has recommended you make the following change in your medication:  DECREASE METOPROLOL TO 50 MG TWICE DAILY AND INCREASE ACCUPRIL TO 40 MG DAILY  Your physician has requested that you have a renal artery duplex. During this test, an ultrasound is used to evaluate blood flow to the kidneys. Allow one hour for this exam. Do not eat after midnight the day before and avoid carbonated beverages. Take your medications as you usually do.   Your physician recommends that you schedule a follow-up appointment in: 6 months

## 2013-05-25 ENCOUNTER — Encounter: Payer: Self-pay | Admitting: Cardiovascular Disease

## 2013-05-25 NOTE — Assessment & Plan Note (Signed)
Is at high-risk for renal artery stenosis. Recent erratic elevations in blood pressure might be related to volume shift secondary to severe hyperglycemia and glucosuria, but we should evaluate him for renal artery stenosis. The beta blocker seems to be causing worsening wheezing and dyspnea. We will reduce the dose especially since he complains of somewhat fatigued. Will try to compensate by increasing the dose of ACE inhibitor.

## 2013-05-25 NOTE — Assessment & Plan Note (Signed)
We again reviewed the critical importance of smoking cessation. He admits that what he is doing his a mistake but has had no luck his previous attempts at quitting. Think he needs to keep on trying. It is the single most important thing he can do for his health.

## 2013-05-25 NOTE — Progress Notes (Signed)
Patient ID: Robert [name], male   DOB: 04-Jul-1951, 62 y.o.   MRN: [id number]      Reason for office visit CAD/PAD follow up  This is a 62 y.o. male with a past medical history significant for CAD and repeated LCX PCI, most recently requiring angioplasty in March of this year. He usually presents with exertional dyspnea and exertional chest tightness relieved by rest, in a slowly accelerating pattern. He has preserved left ventricular systolic function. His past coronary history is quite complicated and summarized below  -cath 2004 showed total occlusion of RCA and intermediate lesions LAD, high grade mid LCX treated with 4x18 mm BMS (Velocity).  - 2006 overlapping 3.5x23 mm Cypher inLCX  - 2007 new downstream stenosis at OM bifurcation treated with 3x18 mm Cypher stent  - 2008 acute stent thrombosis (allergic to plavix, temporarily stopped Ticlid) - thrombolysis/aspiration only  - April 2013 cutting balloon angioplasty for proximal LCX in stent restenosis (Dr. Tresa Endo)  - September 2013 repeat restenosis treated with "stent in stent" Xience Expedition 3.25 x 23 mm (Dr. Allyson Sabal).  - March 2014 left circumflex coronary artery PCI for restenosis, utilizing cutting balloon atherotomy/noncompliant balloon dilatation up to 4.08 mm with the 80% to 95% stenosis being reduced to 0-less than 10%. - No change in RCA or LAD anatomy during last several years.  -Allergic to iodinated contrast, but did well with premedication. Allergic to clopidogrel but tolerates Effient well.  Unfortunately he continues to smoke.   Mediocre glycemic control despite insulin last hemoglobin A1c was 9.4%. Good lipids except minimal elevation in TG. LDL in 70s.   He has had several episodes of dizziness with severe elevation in his blood sugar as high as the 3 to 400s.  Has not been troubled by angina pectoris or dyspnea. Right hip arthritis pain slows him down more than angina. 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 Again his right hip slows him down and prevents him from getting intermittent claudication.   Recently his blood pressure has been very erratic. He complains of a lot of fatigue as well.     Allergies  Allergen Reactions  . Ivp Dye [Iodinated Diagnostic Agents] Hives and Itching  . Plavix [Clopidogrel Bisulfate] Rash    Current Outpatient Prescriptions  Medication Sig Dispense Refill  . ALPRAZolam (XANAX) 0.5 MG tablet Take 0.5 mg by mouth 3 (three) times daily as needed. For anxiety      . aspirin EC 81 MG EC tablet Take 1 tablet (81 mg total) by mouth daily.      [name] buPROPion (WELLBUTRIN) 75 MG tablet Take 1 tablet (75 mg total) by mouth 2 (two) times daily.  60 tablet  9  . cilostazol (PLETAL) 100 MG tablet Take 100 mg by mouth 2 (two) times daily.        . fish oil-omega-3 fatty acids 1000 MG capsule Take 1 g by mouth daily.      . folic acid (FOLVITE) 1 MG tablet Take 1 mg by mouth at bedtime.       . furosemide (LASIX) 20 MG tablet Take 20 mg by mouth 2 (two) times daily as needed for fluid.       [name] glipiZIDE (GLUCOTROL XL) 10 MG 24 hr tablet Take 10 mg by mouth 2 (two) times daily.      . insulin glargine (LANTUS) 100 UNIT/ML injection  Inject 20 Units into the skin 2 (two) times daily.      . isosorbide mononitrate (IMDUR) 60 MG 24 hr tablet Take 60 mg by mouth daily.      . metFORMIN (GLUCOPHAGE) 1000 MG tablet Take 1 tablet (1,000 mg total) by mouth 2 (two) times daily with a meal.      . methotrexate (RHEUMATREX) 2.5 MG tablet Take 25 mg by mouth once a week. Caution:Chemotherapy. Protect from light. ON THURSDAYS      . metoprolol (LOPRESSOR) 50 MG tablet Take 75 mg by mouth 2 (two) times daily.       . prasugrel (EFFIENT) 10 MG TABS tablet Take 1 tablet (10 mg total) by mouth daily.  28 tablet  0  . simvastatin (ZOCOR) 40 MG tablet Take 40 mg by mouth every  evening.      . Tocilizumab 162 MG/0.9ML SOSY Inject 162 mg into the skin once a week. On tuesdays.      . quinapril (ACCUPRIL) 40 MG tablet Take 1 tablet (40 mg total) by mouth at bedtime.  30 tablet  11   No current facility-administered medications for this visit.    Past Medical History  Diagnosis Date  . Hypertension   . Coronary artery disease   . Dysrhythmia   . CHF (congestive heart failure)   . Unstable angina 12/22/2011  . PVD, chronic LLE 12/22/2011  . High cholesterol   . 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 Chronic bronchitis     "maybe once/yr; last time 04/2012" (05/16/2012)  . Shortness of breath 05/16/2012    "@ rest, lying down, w/exertion"  . Type II diabetes mellitus   . Rheumatoid arthritis(714.0)     "qwhere on my body"  . Abnormal nuclear stress test 05/17/2012  . S/P angioplasty with stent of LCX 05/16/12 with expedition DES 05/17/2012  . Tobacco abuse     Past Surgical History  Procedure Laterality Date  . Back surgery    . Cholecystectomy  ~1995  . Coronary angioplasty  11/14/2012  . Coronary angioplasty with stent placement  05/16/2012    "1; makes total ~ 4"  . Nm myocar perf wall motion  08/30/2009    No significant ischemia    Family History  Problem Relation Age of Onset  . Colon cancer Father     History   Social History  . Marital Status: Married    Spouse Name: N/A    Number of Children: N/A  . Years of Education: N/A   Occupational History  . tractor Engineer, maintenance    Social History Main Topics  . Smoking status: Current Every Day Smoker -- 1.00 packs/day for 41 years    Types: Cigarettes  . Smokeless tobacco: Never Used  . Alcohol Use: No  . Drug Use: No  . Sexual Activity: Yes   Other Topics Concern  . Not on file   Social History Narrative  . No narrative on file    Review of systems: Worsening fatigue and class 2-3 dyspnea on exertion. Occasional  wheezing. Occasional dizziness without syncope. The episodes of dizziness appear to be associated with severe hyperglycemia. Chronic severe right hip pain. He denies new focal nodule deficits and currently is not troubled by chest tightness or other discomfort.   The patient also denies abdominal pain, nausea, vomiting, dysphagia, diarrhea, constipation, polyuria, polydipsia, dysuria, hematuria, frequency, urgency, abnormal bleeding  or bruising, fever, chills, unexpected weight changes, mood swings, change in skin or hair texture, change in voice quality, auditory or visual problems, allergic reactions or rashes, new musculoskeletal complaints other than usual "aches and pains".   PHYSICAL EXAM BP 130/66  Resp 20  Ht 6' (1.829 m)  Wt 239 lb 6.4 oz (108.591 kg)  BMI 32.46 kg/m2  General: Alert, oriented x3, no distress Head: no evidence of trauma, PERRL, EOMI, no exophtalmos or lid lag, no myxedema, no xanthelasma; normal ears, nose and oropharynx Neck: normal jugular venous pulsations and no hepatojugular reflux; brisk carotid pulses without delay and no carotid bruits Chest: clear to auscultation, no signs of consolidation by percussion or palpation, normal fremitus, symmetrical and full respiratory excursions Cardiovascular: normal position and quality of the apical impulse, regular rhythm, normal first and second heart sounds, no murmurs, rubs or gallops Abdomen: no tenderness or distention, no masses by palpation, no abnormal pulsatility or arterial bruits, normal bowel sounds, no hepatosplenomegaly Extremities: no clubbing, cyanosis or edema; 2+ radial, ulnar and brachial pulses bilaterally; 2+ right femoral, posterior tibial and dorsalis pedis pulses; 2+ left femoral, 1+ posterior tibial and dorsalis pedis pulses; no subclavian or femoral bruits Neurological: grossly nonfocal   EKG: Sinus rhythm, Q waves consistent with old inferior myocardial infarction, generalized ST segment/T-wave  flattening, nonspecific. A single PVC is seen.   Lipid Panel     Component Value Date/Time   CHOL 146 12/22/2011 0935   TRIG 175* 12/22/2011 0935   HDL 34* 12/22/2011 0935   CHOLHDL 4.3 12/22/2011 0935   VLDL 35 12/22/2011 0935   LDLCALC 77 12/22/2011 0935    BMET    Component Value Date/Time   NA 135 04/03/2013 1218   K 3.8 04/03/2013 1218   CL 102 04/03/2013 1218   CO2 23 04/03/2013 1218   GLUCOSE 245* 04/03/2013 1218   BUN 9 04/03/2013 1218   CREATININE 0.70 04/03/2013 1218   CALCIUM 9.2 04/03/2013 1218   GFRNONAA >90 04/03/2013 1218   GFRAA >90 04/03/2013 1218     ASSESSMENT AND PLAN S/P stent of LCX 05/16/12 with expedition DES, cutting balloon angioplasty for in-stent restenosis March 2014 It has been 6 months since his last procedure which is a good sign. In general Mr. Kinker has presented with instance restenosis within 3-4 months of her stent procedures. On the other hand I think it is only a question of time until he returns with a new coronary event. Most of his coronary risk factors with the exception of hyperlipidemia are not well treated. I am most concerned by the fact that he continues to smoke.  Tobacco abuse We again reviewed the critical importance of smoking cessation. He admits that what he is doing his a mistake but has had no luck his previous attempts at quitting. Think he needs to keep on trying. It is the single most important thing he can do for his health.  HTN (hypertension) Is at high-risk for renal artery stenosis. Recent erratic elevations in blood pressure might be related to volume shift secondary to severe hyperglycemia and glucosuria, but we should evaluate him for renal artery stenosis. The beta blocker seems to be causing worsening wheezing and dyspnea. We will reduce the dose especially since he complains of somewhat fatigued. Will try to compensate by increasing the dose of ACE inhibitor.  Type II IDDM Even though glycemic control remains poor, his  hemoglobin A1c is actually significant improvedt. It is now 9.4% but recently was as  high as 16.1% the   Orders Placed This Encounter  Procedures  . US Renal Artery Stenosis  . EKG 12-Lead   Meds ordered this encounter  Medications  . quinapril (ACCUPRIL) 40 MG tablet    Sig: Take 1 tablet (40 mg total) by mouth at bedtime.    Dispense:  30 tablet    Refill:  11  . prasugrel (EFFIENT) 10 MG TABS tablet    Sig: Take 1 tablet (10 mg total) by mouth daily.    Dispense:  28 tablet    Refill:  0    Order Specific Question:  Supervising Provider    Answer:  [email or web address], Angas Isabell [[id number]]    Junious Silk, MD, [facility] [phone] office [phone] pager

## 2013-05-25 NOTE — Assessment & Plan Note (Signed)
It has been 6 months since his last procedure which is a good sign. In general Robert [name] has presented with instance restenosis within 3-4 months of her stent procedures. On the other hand I think it is only a question of time until he returns with a new coronary event. Most of his coronary risk factors with the exception of hyperlipidemia are not well treated. I am most concerned by the fact that he continues to smoke.

## 2013-05-25 NOTE — Assessment & Plan Note (Signed)
Even though glycemic control remains poor, his hemoglobin A1c is actually significant improvedt. It is now 9.4% but recently was as high as 16.1% the

## 2013-05-27 ENCOUNTER — Other Ambulatory Visit: Payer: Self-pay | Admitting: Cardiovascular Disease

## 2013-05-27 NOTE — Telephone Encounter (Signed)
Rx was sent to pharmacy electronically. 

## 2013-06-19 ENCOUNTER — Ambulatory Visit (HOSPITAL_COMMUNITY)
Admission: RE | Admit: 2013-06-19 | Discharge: 2013-06-19 | Disposition: A | Discharge status: Home / Self Care | Payer: Medicare Other | Source: Ambulatory Visit | Attending: Cardiovascular Disease | Admitting: Cardiovascular Disease

## 2013-06-19 ENCOUNTER — Other Ambulatory Visit (HOSPITAL_COMMUNITY): Payer: Self-pay | Admitting: Cardiovascular Disease

## 2013-06-19 DIAGNOSIS — Q278 Other specified congenital malformations of peripheral vascular system: Secondary | ICD-10-CM | POA: Insufficient documentation

## 2013-06-19 DIAGNOSIS — I701 Atherosclerosis of renal artery: Secondary | ICD-10-CM

## 2013-06-19 DIAGNOSIS — I10 Essential (primary) hypertension: Secondary | ICD-10-CM | POA: Insufficient documentation

## 2013-06-19 NOTE — Progress Notes (Signed)
Renal Artery Duplex Completed. °Brianna L Mazza,RVT °

## 2013-06-20 ENCOUNTER — Encounter: Payer: Self-pay | Admitting: Cardiovascular Disease

## 2013-07-30 ENCOUNTER — Other Ambulatory Visit: Payer: Self-pay | Admitting: Cardiovascular Disease

## 2013-07-30 NOTE — Telephone Encounter (Signed)
Rx was sent to pharmacy electronically. 

## 2013-07-31 ENCOUNTER — Other Ambulatory Visit: Payer: Self-pay

## 2013-07-31 MED ORDER — SIMVASTATIN 40 MG PO TABS: 40.0000 mg | ORAL_TABLET | Freq: Every day | ORAL | Status: DC

## 2013-07-31 NOTE — Telephone Encounter (Signed)
Rx was sent to pharmacy electronically. 

## 2013-09-01 ENCOUNTER — Encounter (HOSPITAL_COMMUNITY)
Admission: EM | Disposition: A | Discharge status: Home / Self Care | Payer: Self-pay | Source: Home / Self Care | Attending: Cardiology

## 2013-09-01 ENCOUNTER — Ambulatory Visit (HOSPITAL_COMMUNITY): Admit: 2013-09-01 | Payer: Self-pay | Admitting: Cardiology

## 2013-09-01 ENCOUNTER — Inpatient Hospital Stay (HOSPITAL_COMMUNITY)
Admission: EM | Admit: 2013-09-01 | Discharge: 2013-09-03 | DRG: 247 | Disposition: A | Discharge status: Home / Self Care | Payer: Medicare Other | Attending: Cardiology | Admitting: Cardiology

## 2013-09-01 ENCOUNTER — Encounter (HOSPITAL_COMMUNITY): Payer: Self-pay | Admitting: Emergency Medicine

## 2013-09-01 DIAGNOSIS — Z79899 Other long term (current) drug therapy: Secondary | ICD-10-CM

## 2013-09-01 DIAGNOSIS — I509 Heart failure, unspecified: Secondary | ICD-10-CM | POA: Diagnosis present

## 2013-09-01 DIAGNOSIS — Z794 Long term (current) use of insulin: Secondary | ICD-10-CM

## 2013-09-01 DIAGNOSIS — I739 Peripheral vascular disease, unspecified: Secondary | ICD-10-CM | POA: Diagnosis present

## 2013-09-01 DIAGNOSIS — I200 Unstable angina: Secondary | ICD-10-CM

## 2013-09-01 DIAGNOSIS — I2119 ST elevation (STEMI) myocardial infarction involving other coronary artery of inferior wall: Principal | ICD-10-CM | POA: Diagnosis present

## 2013-09-01 DIAGNOSIS — E669 Obesity, unspecified: Secondary | ICD-10-CM | POA: Diagnosis present

## 2013-09-01 DIAGNOSIS — F17200 Nicotine dependence, unspecified, uncomplicated: Secondary | ICD-10-CM | POA: Diagnosis present

## 2013-09-01 DIAGNOSIS — I10 Essential (primary) hypertension: Secondary | ICD-10-CM | POA: Diagnosis present

## 2013-09-01 DIAGNOSIS — Z95820 Peripheral vascular angioplasty status with implants and grafts: Secondary | ICD-10-CM

## 2013-09-01 DIAGNOSIS — Z7982 Long term (current) use of aspirin: Secondary | ICD-10-CM

## 2013-09-01 DIAGNOSIS — Z888 Allergy status to other drugs, medicaments and biological substances status: Secondary | ICD-10-CM

## 2013-09-01 DIAGNOSIS — Z720 Tobacco use: Secondary | ICD-10-CM | POA: Diagnosis present

## 2013-09-01 DIAGNOSIS — I2510 Atherosclerotic heart disease of native coronary artery without angina pectoris: Secondary | ICD-10-CM | POA: Diagnosis present

## 2013-09-01 DIAGNOSIS — E119 Type 2 diabetes mellitus without complications: Secondary | ICD-10-CM | POA: Diagnosis present

## 2013-09-01 DIAGNOSIS — J449 Chronic obstructive pulmonary disease, unspecified: Secondary | ICD-10-CM | POA: Diagnosis present

## 2013-09-01 DIAGNOSIS — T82897A Other specified complication of cardiac prosthetic devices, implants and grafts, initial encounter: Secondary | ICD-10-CM | POA: Diagnosis present

## 2013-09-01 DIAGNOSIS — M069 Rheumatoid arthritis, unspecified: Secondary | ICD-10-CM | POA: Diagnosis present

## 2013-09-01 DIAGNOSIS — Z91041 Radiographic dye allergy status: Secondary | ICD-10-CM | POA: Diagnosis present

## 2013-09-01 DIAGNOSIS — I2582 Chronic total occlusion of coronary artery: Secondary | ICD-10-CM | POA: Diagnosis present

## 2013-09-01 DIAGNOSIS — J4489 Other specified chronic obstructive pulmonary disease: Secondary | ICD-10-CM | POA: Diagnosis present

## 2013-09-01 DIAGNOSIS — I252 Old myocardial infarction: Secondary | ICD-10-CM | POA: Diagnosis present

## 2013-09-01 DIAGNOSIS — Y831 Surgical operation with implant of artificial internal device as the cause of abnormal reaction of the patient, or of later complication, without mention of misadventure at the time of the procedure: Secondary | ICD-10-CM | POA: Diagnosis present

## 2013-09-01 DIAGNOSIS — E785 Hyperlipidemia, unspecified: Secondary | ICD-10-CM | POA: Diagnosis present

## 2013-09-01 HISTORY — PX: PERCUTANEOUS CORONARY STENT INTERVENTION (PCI-S): SHX5485

## 2013-09-01 HISTORY — PX: LEFT HEART CATHETERIZATION WITH CORONARY ANGIOGRAM: SHX5451

## 2013-09-01 LAB — COMPREHENSIVE METABOLIC PANEL
ALT: 34 U/L (ref 0–53)
Albumin: 3.7 g/dL (ref 3.5–5.2)
Alkaline Phosphatase: 100 U/L (ref 39–117)
BUN: 11 mg/dL (ref 6–23)
CO2: 21 mEq/L (ref 19–32)
Calcium: 8.8 mg/dL (ref 8.4–10.5)
Chloride: 96 mEq/L (ref 96–112)
Creatinine, Ser: 0.77 mg/dL (ref 0.50–1.35)
GFR calc Af Amer: >90 mL/min (ref >90)
Glucose, Bld: 354 mg/dL — ABNORMAL HIGH (ref 70–99)
Potassium: 3.7 mEq/L (ref 3.5–5.1)
Sodium: 129 mEq/L — ABNORMAL LOW (ref 135–145)
Total Bilirubin: 0.3 mg/dL (ref 0.3–1.2)
Total Protein: 6.5 g/dL (ref 6.0–8.3)

## 2013-09-01 LAB — CBC
HCT: 49.2 % (ref 39.0–52.0)
Hemoglobin: 18 g/dL — ABNORMAL HIGH (ref 13.0–17.0)
MCHC: 36.6 g/dL — ABNORMAL HIGH (ref 30.0–36.0)
RBC: 5.16 MIL/uL (ref 4.22–5.81)
WBC: 8.7 10*3/uL (ref 4.0–10.5)

## 2013-09-01 LAB — TROPONIN I: Troponin I: 2.25 ng/mL (ref <0.30)

## 2013-09-01 LAB — GLUCOSE, CAPILLARY: Glucose-Capillary: 464 mg/dL — ABNORMAL HIGH (ref 70–99)

## 2013-09-01 LAB — MRSA PCR SCREENING: MRSA by PCR: NEGATIVE

## 2013-09-01 SURGERY — LEFT HEART CATHETERIZATION WITH CORONARY ANGIOGRAM
Anesthesia: LOCAL

## 2013-09-01 MED ORDER — DIPHENHYDRAMINE HCL 50 MG/ML IJ SOLN: 50.0000 mg | Freq: Once | INTRAMUSCULAR | Status: DC

## 2013-09-01 MED ORDER — FENTANYL CITRATE 0.05 MG/ML IJ SOLN
INTRAMUSCULAR | Status: AC
  Filled 2013-09-01: qty 2

## 2013-09-01 MED ORDER — INSULIN ASPART 100 UNIT/ML ~~LOC~~ SOLN
0.0000 [IU] | Freq: Three times a day (TID) | SUBCUTANEOUS | Status: DC
  Administered 2013-09-02 (×2): 20 [IU] via SUBCUTANEOUS
  Administered 2013-09-02: 11 [IU] via SUBCUTANEOUS
  Administered 2013-09-03 (×2): 15 [IU] via SUBCUTANEOUS

## 2013-09-01 MED ORDER — VERAPAMIL HCL 2.5 MG/ML IV SOLN
INTRAVENOUS | Status: AC
  Filled 2013-09-01: qty 2

## 2013-09-01 MED ORDER — METHYLPREDNISOLONE SODIUM SUCC 125 MG IJ SOLR
INTRAMUSCULAR | Status: AC
  Filled 2013-09-01: qty 2

## 2013-09-01 MED ORDER — FOLIC ACID 1 MG PO TABS
1.0000 mg | ORAL_TABLET | Freq: Every day | ORAL | Status: DC
  Administered 2013-09-01 – 2013-09-02 (×2): 1 mg via ORAL
  Filled 2013-09-01 (×3): qty 1

## 2013-09-01 MED ORDER — METOPROLOL TARTRATE 50 MG PO TABS
75.0000 mg | ORAL_TABLET | Freq: Two times a day (BID) | ORAL | Status: DC
  Administered 2013-09-01 – 2013-09-03 (×4): 75 mg via ORAL
  Filled 2013-09-01 (×5): qty 1

## 2013-09-01 MED ORDER — ASPIRIN 81 MG PO CHEW
81.0000 mg | CHEWABLE_TABLET | Freq: Every day | ORAL | Status: DC
  Administered 2013-09-02 – 2013-09-03 (×2): 81 mg via ORAL
  Filled 2013-09-01: qty 1

## 2013-09-01 MED ORDER — ALPRAZOLAM 0.5 MG PO TABS
0.5000 mg | ORAL_TABLET | Freq: Three times a day (TID) | ORAL | Status: DC | PRN
  Administered 2013-09-01: 0.5 mg via ORAL
  Filled 2013-09-01 (×2): qty 1

## 2013-09-01 MED ORDER — ACETAMINOPHEN 325 MG PO TABS: 650.0000 mg | ORAL_TABLET | ORAL | Status: DC | PRN

## 2013-09-01 MED ORDER — OMEGA-3-ACID ETHYL ESTERS 1 G PO CAPS
1.0000 g | ORAL_CAPSULE | Freq: Every day | ORAL | Status: DC
  Administered 2013-09-02 – 2013-09-03 (×2): 1 g via ORAL
  Filled 2013-09-01 (×2): qty 1

## 2013-09-01 MED ORDER — NITROGLYCERIN IN D5W 200-5 MCG/ML-% IV SOLN
INTRAVENOUS | Status: AC
  Filled 2013-09-01: qty 250

## 2013-09-01 MED ORDER — FAMOTIDINE IN NACL 20-0.9 MG/50ML-% IV SOLN: 20.0000 mg | Freq: Once | INTRAVENOUS | Status: DC

## 2013-09-01 MED ORDER — FAMOTIDINE IN NACL 20-0.9 MG/50ML-% IV SOLN
INTRAVENOUS | Status: AC
  Filled 2013-09-01: qty 50

## 2013-09-01 MED ORDER — ONDANSETRON HCL 4 MG/2ML IJ SOLN: 4.0000 mg | Freq: Four times a day (QID) | INTRAMUSCULAR | Status: DC | PRN

## 2013-09-01 MED ORDER — OMEGA-3 FATTY ACIDS 1000 MG PO CAPS: 1.0000 g | ORAL_CAPSULE | Freq: Every day | ORAL | Status: DC

## 2013-09-01 MED ORDER — METOPROLOL TARTRATE 1 MG/ML IV SOLN
INTRAVENOUS | Status: AC
  Filled 2013-09-01: qty 5

## 2013-09-01 MED ORDER — MIDAZOLAM HCL 2 MG/2ML IJ SOLN
INTRAMUSCULAR | Status: AC
  Filled 2013-09-01: qty 2

## 2013-09-01 MED ORDER — BENZONATATE 100 MG PO CAPS
100.0000 mg | ORAL_CAPSULE | Freq: Three times a day (TID) | ORAL | Status: DC | PRN
  Administered 2013-09-02: 100 mg via ORAL
  Filled 2013-09-01: qty 1

## 2013-09-01 MED ORDER — LIDOCAINE HCL (PF) 1 % IJ SOLN
INTRAMUSCULAR | Status: AC
  Filled 2013-09-01: qty 30

## 2013-09-01 MED ORDER — SIMVASTATIN 40 MG PO TABS
40.0000 mg | ORAL_TABLET | Freq: Every day | ORAL | Status: DC
  Administered 2013-09-01 – 2013-09-02 (×2): 40 mg via ORAL
  Filled 2013-09-01 (×3): qty 1

## 2013-09-01 MED ORDER — HEPARIN (PORCINE) IN NACL 2-0.9 UNIT/ML-% IJ SOLN
INTRAMUSCULAR | Status: AC
  Filled 2013-09-01: qty 1000

## 2013-09-01 MED ORDER — BIVALIRUDIN 250 MG IV SOLR
INTRAVENOUS | Status: AC
  Filled 2013-09-01: qty 250

## 2013-09-01 MED ORDER — TICAGRELOR 90 MG PO TABS
ORAL_TABLET | ORAL | Status: AC
  Filled 2013-09-01: qty 1

## 2013-09-01 MED ORDER — TICAGRELOR 90 MG PO TABS
ORAL_TABLET | ORAL | Status: AC
  Administered 2013-09-02: 90 mg via ORAL
  Filled 2013-09-01: qty 1

## 2013-09-01 MED ORDER — NITROGLYCERIN 0.4 MG SL SUBL: 0.4000 mg | SUBLINGUAL_TABLET | SUBLINGUAL | Status: DC | PRN

## 2013-09-01 MED ORDER — METHYLPREDNISOLONE SODIUM SUCC 125 MG IJ SOLR: 125.0000 mg | Freq: Once | INTRAMUSCULAR | Status: DC

## 2013-09-01 MED ORDER — INSULIN GLARGINE 100 UNIT/ML ~~LOC~~ SOLN
20.0000 [IU] | Freq: Two times a day (BID) | SUBCUTANEOUS | Status: DC
  Administered 2013-09-01 – 2013-09-03 (×4): 20 [IU] via SUBCUTANEOUS
  Filled 2013-09-01 (×5): qty 0.2

## 2013-09-01 MED ORDER — TIROFIBAN HCL IV 12.5 MG/250 ML
INTRAVENOUS | Status: AC
  Filled 2013-09-01: qty 250

## 2013-09-01 MED ORDER — DIPHENHYDRAMINE HCL 50 MG/ML IJ SOLN
INTRAMUSCULAR | Status: AC
  Filled 2013-09-01: qty 1

## 2013-09-01 MED ORDER — TICAGRELOR 90 MG PO TABS
90.0000 mg | ORAL_TABLET | Freq: Two times a day (BID) | ORAL | Status: DC
  Administered 2013-09-02 – 2013-09-03 (×3): 90 mg via ORAL
  Filled 2013-09-01 (×5): qty 1

## 2013-09-01 MED ORDER — BUPROPION HCL 75 MG PO TABS
75.0000 mg | ORAL_TABLET | Freq: Two times a day (BID) | ORAL | Status: DC
  Administered 2013-09-01 – 2013-09-03 (×4): 75 mg via ORAL
  Filled 2013-09-01 (×5): qty 1

## 2013-09-01 NOTE — ED Notes (Signed)
Presents with left sided chest pain began this afternoon, associated with impending doom feelings, nausea, pale, diaphoresis, SOB. HX of multiple MIS and stent placements. Given total of 4 nitros, 10 mg  Morphine, pain from 9/10 to 4/10. Pt alert.

## 2013-09-01 NOTE — Interval H&P Note (Signed)
History and Physical Interval Note:  09/01/2013 8:16 PM  Robert [name]  has presented today for surgery, with the diagnosis of STEMI  The various methods of treatment have been discussed with the patient and family. After consideration of risks, benefits and other options for treatment, the patient has consented to  Procedure(s): LEFT HEART CATHETERIZATION WITH CORONARY ANGIOGRAM (N/A) /-as a surgical intervention .  The patient's history has been reviewed, patient examined, no change in status, stable for surgery.  I have reviewed the patient's chart and labs.  Questions were answered to the patient's satisfaction.     [name]

## 2013-09-01 NOTE — H&P (Signed)
I saw the patient while en route to the Cardiac Catheterization lab with ~1-1.5 mm Inferior & Lateral STEMI.  He has long-standing h/o CAD involving the Native LCx with 100% RCA occlusion.  Poorly controlled DM, HTN, HLD & continues to smoke.    Clearly Inferolateral STEMI - Will take Emergently to the CATH Lab via Radial approach.  Marykay Lex, M.D., M.S. [facility] [address] [place], [place]  [address]  [phone] Pager # [phone] 09/01/2013 1910 hr  Cath Lab Visit (complete for each Cath Lab visit)  Clinical Evaluation Leading to the Procedure:   ACS: yes; Inferolateral STEMI  Non-ACS:    Anginal Classification: CCS IV  Anti-ischemic medical therapy: Maximal Therapy (2 or more classes of medications)  Non-Invasive Test Results: No non-invasive testing performed  Prior CABG: No previous CABG

## 2013-09-01 NOTE — CV Procedure (Signed)
CARDIAC CATHETERIZATION AND PERCUTANEOUS CORONARY INTERVENTION REPORT  NAME:  VERNIS EID   MRN: [id number] DOB:  11-Oct-1950   ADMIT DATE: 09/01/2013 Procedure Date: 09/01/2013  INTERVENTIONAL CARDIOLOGIST: Marykay Lex, M.D., MS PRIMARY CARE PROVIDER: Cassell Smiles., MD PRIMARY CARDIOLOGIST: Thurmon Fair, MD  PATIENT:  Robert [name] is a 62 y.o. male with significant past medical history of coronary disease with repeated PCI as the circumflex as well as known occluded RCA. His last PCI was in March of 2014 where he had balloon angioplasty of in-stent restenosis of the circumflex stent. He continues to smoke, and is poorly controlled diabetes. Given the relatively well since March until this afternoon (09/01/2013) began noting significant onset dyspnea followed by chest discomfort, nausea and diaphoresis. He waited until his daughter and grandson and left common I., Lupita Leash significant of his discomfort and called EMS. Upon EMS arrival, he had roughly 1.5 mm ST elevations in the inferior and lateral leads. Code STEMI was called the patient was transferred To [facility] Room (arriving at 1845 hours) where he was stabilized and then brought to the cardiac catheterization lab upon arrival in the Cath Lab team. He arrived to the cardiac catheterization lab at 1900 hours with persistent 7-8/10 chest pain, on nonrebreather but hemodynamically stable.  Cardiac History: cath 2004 showed total occlusion of RCA and intermediate lesions LAD, high grade mid LCX treated with 4x18 mm BMS (Velocity).  - 2006 overlapping 3.5x23 mm Cypher inLCX  - 2007 new downstream stenosis at OM bifurcation treated with 3x18 mm Cypher stent  - 2008 acute stent thrombosis (allergic to plavix, temporarily stopped Ticlid) - thrombolysis/aspiration only  - April 2013 cutting balloon angioplasty for proximal LCX in stent restenosis (Dr. Tresa Endo)  - September 2013 repeat restenosis treated with "stent in stent"  Xience Expedition 3.25 x 23 mm (Dr. Allyson Sabal).  - March 2014 left circumflex coronary artery PCI for restenosis, utilizing cutting balloon atherotomy/noncompliant balloon dilatation up to 4.08 mm with the 80% to 95% stenosis being reduced to 0-less than 10%.  - No change in RCA or LAD anatomy during last several years.  -Allergic to iodinated contrast, but did well with premedication. Allergic to clopidogrel but tolerates Effient well.    PRE-OPERATIVE DIAGNOSIS:    INFEROLATERAL STEMI  PROCEDURES PERFORMED:    LEFT HEART CATHETERIZATION WITH CORONARY ANGIOGRAPHY  PERCUTANEOUS CORONARY INTERVENTION ON 100% THROMBOTIC IN-STENT RESTENOSIS OF PROXIMAL CIRCUMFLEX STENT -- PROMUS PREMIER DES PLACED IN INTERVENING SEGMENT FROM PROXIMAL TO DISTAL CIRCUMFLEX STENTS; PTCA OF PROXIMAL STENT  PROCEDURE:Consent:  Risks of procedure as well as the alternatives and risks of each were explained to the (patient/caregiver).  Verbal, consent for procedure obtained.  Written consent not obtained due to Emergency Procedure.  PROCEDURE: The patient was brought to the 2nd Floor [facility] in the fasting state and prepped and draped in the usual sterile fashion for Right groin or radial access. A modified Allen's test with plethysmography was performed, revealing excellent Ulnar artery collateral flow.  Sterile technique was used including antiseptics, cap, gloves, gown, hand hygiene, mask and sheet.  Skin prep: Chlorhexidine.  Time Out: Verified patient identification, verified procedure, site/side was marked, verified correct patient position, special equipment/implants available, medications/allergies/relevent history reviewed, required imaging and test results available.  Performed  Access: R RADIAL Artery; 6 Fr Sheath -- Seldinger technique (Angiocath Micropuncture Kit) -- 1910 hours  IA Radial Cocktail, IV Angiomax Bolus Diagnostic:  5 Fr TIG 4.0, 6 Fr  XB 3.5  Guide, 5Fr Angled  Pigtail catheters advanced & exchanged over lon-exchange Safety-J wire.  Left & Right Coronary Artery Angiography: TIG 4.0  PCI: XB 3.5 Guide  LV Hemodynamics (LV Gram): Angled Pigtail  TR Band:  2007 Hours, 14 mL air  MEDICATIONS:  Anesthesia:  Local Lidocaine 2 ml  Sedation:  1 mg IV Versed, 50 mcg IV fentanyl ;   Premedication: IV Solu-Medrol 125 mg, IV Benadryl 50 mg, IV Pepcid 20 mg  Omnipaque Contrast: 170 ml Radial Cocktail: 5 mg Verapamil, 400 mcg NTG, 2 ml 2% Lidocaine in 10 ml NS  Anticoagulation:  Angiomax Bolus & drip  Anti-Platelet Agent:  Aggrastat single bolus followed by infusion; 180 mg Brilinta (did not take his Effient today)  IC Nitroglycerin 20 mcg x2  Hemodynamics:  Central Aortic / Mean Pressures: 124/70 mmHg; 90 mmHg  Left Ventricular Pressures / EDP: 124/6 mmHg; 16 mmHg  Left Ventriculography:  EF: Roughly 40% with basal inferior/inferolateral hypokinesis.  Coronary Anatomy:  RCA: Known 100% occluded proximal. Fed via left to right collaterals from LAD.  Left Main: Very short, essentially double barrel ostium for LAD and Circumflex. LAD: Large-caliber vessel with proximal roughly 20-30% stenosis. The vessel wraps around the apex and gives collaterals to the RCA both from septal perforators and the apical LAD. It gives rise to one major diagonal branch that is relatively proximal. It has roughly 20-30% stenosis.   Left Circumflex: Large-caliber vessel with 2 stented segments, one in the proximal segment that has 100% thrombotic occlusion in the distal portion of the stent. There is also a distal stent noted on the downstream the circumflex.  Post angioplasty angiography revealed a small first obtuse marginal branch just beyond the first stent, the vessel then bifurcates into OM 2 and OM 3 distally. These vessels are relatively free of disease. There is a small AV groove circumflex he comes off within the proximal stented segment.  There was  significant residual thrombus in the proximal stent, but also noted in the intervening segment between the proximal and distal tendon segments.  Based on initial angiography on ECG the clear-cut culprit for his inferolateral STEMI is the occluded proximal circumflex stented segment. Plans were made to proceed with PCI. With the guide catheter in place additional angiographic views of the left wrist were obtained to visualize the LAD. Angiomax bolus administered at the time access and infusion was continued throughout the case. He was reduced to the renal dose rate to complete her for 2 hours post PCI. As the patient has been on Effient, and now has in-stent thrombosis, I made a decision to switch him to Brilinta therefore he was loaded with Brilinta 180 mg. Also, simply because of the significant amount of in-stent thrombosis present, the decision made to use adjunct of Aggrastat.   Percutaneous Coronary Intervention:   Guide: 6 Fr   XB 3.5 Guidewire: BMW Predilation Balloon: Trek 2.5 mm x 15 mm; at the initial occlusion site; 1936 hours  12 Atm x 30 Sec x 2 inflations distal and proximal.  Post balloon angioplasty it angiography revealed restoration of TIMI-3 flow down the RCA with a not noted above. In thrombosis noted in the intervening segment between the proximal stents and distal stented segment there is also significant thrombus within the proximal stent. This was when Aggrastat was initiated.  The initial plan had been to do balloon angioplasty along, however based on the presence of thrombus in the intervening segment with diffuse irregularities, the decision was made to  cover the intervening segment with a drug-eluting stent to  Stent: Promus Premier DES 3.5 mm x 24 mm; overlapping the proximal and distal stents  Deployed at 16 Atm x 45 Sec --> final distal diameter 3.75 mm  Post-deployment angiography revealed persistent thrombosis noted upstream of the stent, therefore the plan was to  proceed with post-dilation of the stent itself as well as the proximal stented segment. Post-dilation Balloon: [place] Trek 4.0 mm x 20 mm;   2 inflations at 14 Atm x 45 Sec -- within the proximal sports of the stent, then pulled back to cover the proximal stented segment in the overlapping segment.  Final Diameter: 4.1 mm proximal, 3.75 mm distal  Post deployment angiography in multiple views, with and without guidewire in place revealed excellent stent deployment and lesion coverage.  There was no evidence of dissection or perforation.  PATIENT DISPOSITION:    The patient was transferred to the PACU holding area in a hemodynamicaly stable, chest pain free condition.  The patient tolerated the procedure well, and there were no complications.  EBL:   < 10 ml  The patient was stable before, during, and after the procedure.  POST-OPERATIVE DIAGNOSIS:    Inferolateral STEMI from recurrent thrombotic in-stent restenosis of the proximal stented segment  Successful intervention on the occluded segment with balloon angioplasty followed by intervening segment PCI using a Promus Premier DES stent 3.5 mm x 24 mm (postdilated to 4.1 mm proximally including dilation of the proximal stent, and 3.75 mm at the distal overlap  Mild disease noted in the LAD, with known RCA occlusion that via left to right collaterals.  Normal LV function with mildly elevated EDP, and known inferolateral hypokinesis.  PLAN OF CARE:  Admit to CCU overnight. Standard post radial cath care.  Continue IV Angiomax at reduced rate for 2 hours, and IV Aggrastat a standard rate for 12 hours  Converted from Effient to Brilinta, continue dual antiplatelet therapy, essentially lifelong.  Depending on how stable he is in the next day or so, could consider early, but not fast-track discharge in roughly 3 days   Procedure results were discussed with the patient and family following the procedure.  Marykay Lex, M.D.,  M.S. [facility] [address] [place], [place]  [address]  [phone]  09/01/2013 8:17 PM

## 2013-09-01 NOTE — H&P (Signed)
Robert [name] is an 62 y.o. male.   Chief Complaint: Chest Pain/STEMI HPI:  This is a 62 y.o. Obese male with a past medical history significant for CAD and repeated LCX PCI, most recently requiring angioplasty in March of this year.  He continues to smoke.  He has preserved left ventricular systolic function. His past coronary history is quite complicated and summarized below:  -cath 2004 showed total occlusion of RCA and intermediate lesions LAD, high grade mid LCX treated with 4x18 mm BMS (Velocity).  - 2006 overlapping 3.5x23 mm Cypher inLCX  - 2007 new downstream stenosis at OM bifurcation treated with 3x18 mm Cypher stent  - 2008 acute stent thrombosis (allergic to plavix, temporarily stopped Ticlid) - thrombolysis/aspiration only  - April 2013 cutting balloon angioplasty for proximal LCX in stent restenosis (Dr. Tresa Endo)  - September 2013 repeat restenosis treated with "stent in stent" Xience Expedition 3.25 x 23 mm (Dr. Allyson Sabal).  - March 2014 left circumflex coronary artery PCI for restenosis, utilizing cutting balloon atherotomy/noncompliant balloon dilatation up to 4.08 mm with the 80% to 95% stenosis being reduced to 0-less than 10%.  - No change in RCA or LAD anatomy during last several years.  -Allergic to iodinated contrast, but did well with premedication. Allergic to clopidogrel but tolerates Effient well.   Last hemoglobin A1c was 9.4%. Good lipids except minimal elevation in TG. LDL in 70s.   The patient reports onset of SOB about two hours ago with subsequent CP, nausea and diaphoresis.  He took four baby ASA.  EMS EKG shows ST elevation in lateral leads with reciprocal changes.  Current CP level 3/10.   Past Medical History  Diagnosis Date  . Hypertension   . Coronary artery disease   . Dysrhythmia   . CHF (congestive heart failure)   . Unstable angina 12/22/2011  . PVD, chronic LLE 12/22/2011  . High cholesterol   . MI (myocardial infarction) ~ 2002 thru 2008    "I've  had 5  heart conditions; not sure all were MI"  . Chronic bronchitis     "maybe once/yr; last time 04/2012" (05/16/2012)  . Shortness of breath 05/16/2012    "@ rest, lying down, w/exertion"  . Type II diabetes mellitus   . Rheumatoid arthritis(714.0)     "qwhere on my body"  . Abnormal nuclear stress test 05/17/2012  . S/P angioplasty with stent of LCX 05/16/12 with expedition DES 05/17/2012  . Tobacco abuse     Past Surgical History  Procedure Laterality Date  . Back surgery    . Cholecystectomy  ~1995  . Coronary angioplasty  11/14/2012  . Coronary angioplasty with stent placement  05/16/2012    "1; makes total ~ 4"  . Nm myocar perf wall motion  08/30/2009    No significant ischemia    Family History  Problem Relation Age of Onset  . Colon cancer Father    Social History:  reports that he has been smoking Cigarettes.  He has a 41 pack-year smoking history. He has never used smokeless tobacco. He reports that he does not drink alcohol or use illicit drugs.  Allergies:  Allergies  Allergen Reactions  . Ivp Dye [Iodinated Diagnostic Agents] Hives and Itching  . Plavix [Clopidogrel Bisulfate] Rash    Medications Prior to Admission  Medication Sig Dispense Refill  . ALPRAZolam (XANAX) 0.5 MG tablet Take 0.5 mg by mouth 3 (three) times daily as needed. For anxiety      . aspirin EC  81 MG EC tablet Take 1 tablet (81 mg total) by mouth daily.      [name] buPROPion (WELLBUTRIN) 75 MG tablet Take 1 tablet (75 mg total) by mouth 2 (two) times daily.  60 tablet  9  . cilostazol (PLETAL) 100 MG tablet Take 100 mg by mouth 2 (two) times daily.        . fish oil-omega-3 fatty acids 1000 MG capsule Take 1 g by mouth daily.      . folic acid (FOLVITE) 1 MG tablet Take 1 mg by mouth at bedtime.       . furosemide (LASIX) 20 MG tablet Take 20 mg by mouth 2 (two) times daily as needed for fluid.       [name] glipiZIDE (GLUCOTROL XL) 10 MG 24 hr tablet Take 10 mg by mouth 2 (two) times daily.      . insulin  glargine (LANTUS) 100 UNIT/ML injection Inject 20 Units into the skin 2 (two) times daily.      . isosorbide mononitrate (IMDUR) 60 MG 24 hr tablet Take 1 tablet (60 mg total) by mouth daily.  30 tablet  11  . metFORMIN (GLUCOPHAGE) 1000 MG tablet Take 1 tablet (1,000 mg total) by mouth 2 (two) times daily with a meal.      . methotrexate (RHEUMATREX) 2.5 MG tablet Take 25 mg by mouth once a week. Caution:Chemotherapy. Protect from light. ON THURSDAYS      . metoprolol (LOPRESSOR) 50 MG tablet Take 75 mg by mouth 2 (two) times daily.       . prasugrel (EFFIENT) 10 MG TABS tablet Take 1 tablet (10 mg total) by mouth daily.  28 tablet  0  . quinapril (ACCUPRIL) 40 MG tablet Take 1 tablet (40 mg total) by mouth at bedtime.  30 tablet  11  . simvastatin (ZOCOR) 40 MG tablet Take 1 tablet (40 mg total) by mouth at bedtime.  30 tablet  5  . Tocilizumab 162 MG/0.9ML SOSY Inject 162 mg into the skin once a week. On tuesdays.        No results found for this or any previous visit (from the past 48 hour(s)). No results found.  Review of Systems  Constitutional: Positive for diaphoresis. Negative for fever.  Respiratory: Positive for shortness of breath.   Cardiovascular: Positive for chest pain. Negative for orthopnea and leg swelling.  All other systems reviewed and are negative.    Blood pressure 144/122, pulse 101, temperature 97.4 F (36.3 C), temperature source Axillary, resp. rate 22, height 6' (1.829 m), weight 240 lb (108.863 kg), SpO2 100.00%. Physical Exam  Constitutional: He appears well-developed. He appears distressed.  Obese  HENT:  Head: Normocephalic and atraumatic.  Eyes: EOM are normal.  Cardiovascular: Normal rate, regular rhythm, S1 normal and S2 normal.   No murmur heard. Pulses:      Radial pulses are 2+ on the right side.  Respiratory: Effort normal and breath sounds normal. He has no wheezes. He has no rales.  Musculoskeletal: He exhibits no edema.  Neurological: He  is alert.  Skin: Skin is warm.  Clammy  Psychiatric: He has a normal mood and affect.     Assessment/Plan Principal Problem:   STEMI (ST elevation myocardial infarction) Active Problems:   Type II IDDM   HTN (hypertension)   Tobacco abuse   CAD, MI '95, CFX stent '04, CFX DES 9/06, CFX DES 8/07 with OM POBA   PVD, chronic LLE   Contrast media allergy  Plan:  The patient was taken emergently to the cath lab.    Jereline Ticer 09/01/2013, 7:29 PM

## 2013-09-01 NOTE — Progress Notes (Signed)
Met pt in ed and offered to make calls to family. Pt said his wife "Robert [name]" was on the way and gave me his wallet to pass on to her when she arrived. Staff paged me when wife arrived. Gave her pt's wallet and escorted her to 2nd floor waiting area. Wife was a little alarmed at my presence and shared with me that her first husband passed away 14 years ago today. I frequently reported to wife of pt's condition as given to me by staff until the procedure was complete and Dr. [name] reported directly to wife and other family members. Pt's wife and family frequently expressed gratitude for presence until they could see the pt. Offered support and comfort during our visit. Marjory Lies Chaplain  09/01/13 2000  Clinical Encounter Type  Visited With Patient;Family

## 2013-09-01 NOTE — ED Provider Notes (Signed)
CSN: [id number]     Arrival date & time 09/01/13  1900 History   None    Chief Complaint  Patient presents with  . Code STEMI   (Consider location/radiation/quality/duration/timing/severity/associated sxs/prior Treatment) Patient is a 62 y.o. male presenting with chest pain.  Chest Pain Pain location:  Substernal area Pain quality: pressure   Pain radiates to:  Does not radiate Pain radiates to the back: no   Pain severity:  Moderate Onset quality:  Gradual Duration:  3 hours Timing:  Constant Progression:  Worsening Chronicity:  Recurrent Relieved by: given morphine, nitro. Associated symptoms: shortness of breath   Risk factors: coronary artery disease     Past Medical History  Diagnosis Date  . Hypertension   . Coronary artery disease   . Dysrhythmia   . CHF (congestive heart failure)   . Unstable angina 12/22/2011  . PVD, chronic LLE 12/22/2011  . High cholesterol   . 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 Chronic bronchitis     "maybe once/yr; last time 04/2012" (05/16/2012)  . Shortness of breath 05/16/2012    "@ rest, lying down, w/exertion"  . Type II diabetes mellitus   . Rheumatoid arthritis(714.0)     "qwhere on my body"  . Abnormal nuclear stress test 05/17/2012  . S/P angioplasty with stent of LCX 05/16/12 with expedition DES 05/17/2012  . Tobacco abuse    Past Surgical History  Procedure Laterality Date  . Back surgery    . Cholecystectomy  ~1995  . Coronary angioplasty  11/14/2012  . Coronary angioplasty with stent placement  05/16/2012    "1; makes total ~ 4"  . Nm myocar perf wall motion  08/30/2009    No significant ischemia   Family History  Problem Relation Age of Onset  . Colon cancer Father    History  Substance Use Topics  . Smoking status: Current Every Day Smoker -- 1.00 packs/day for 41 years    Types: Cigarettes  . Smokeless tobacco: Never Used  . Alcohol Use: No    Review  of Systems  Unable to perform ROS: Other  Respiratory: Positive for shortness of breath.   Cardiovascular: Positive for chest pain.    Allergies  Ivp dye and Plavix  Home Medications   No current outpatient prescriptions on file. BP 157/89  Pulse 88  Temp(Src) 97.3 F (36.3 C) (Oral)  Resp 27  Ht 6' (1.829 m)  Wt 234 lb 5.6 oz (106.3 kg)  BMI 31.78 kg/m2  SpO2 98% Physical Exam  Constitutional: He is oriented to person, place, and time. He appears well-developed and well-nourished. He appears distressed.  HENT:  Head: Normocephalic and atraumatic.  Eyes: Pupils are equal, round, and reactive to light.  Neck: Normal range of motion. Neck supple.  Cardiovascular: Tachycardia present.   Pulses:      Radial pulses are 2+ on the right side, and 2+ on the left side.  Pulmonary/Chest: Breath sounds normal. No accessory muscle usage. No respiratory distress. He has no decreased breath sounds. He has no wheezes.  Abdominal: Soft. Normal appearance.  Neurological: He is alert and oriented to person, place, and time. He is not disoriented. GCS eye subscore is 4. GCS verbal subscore is 5. GCS motor subscore is 6.  Skin: He is diaphoretic.    ED Course  Procedures (including critical care time) Labs Review Labs Reviewed  CBC - Abnormal;  Notable for the following:    Hemoglobin 18.0 (*)    MCH 34.9 (*)    MCHC 36.6 (*)    Platelets 132 (*)    All other components within normal limits  COMPREHENSIVE METABOLIC PANEL - Abnormal; Notable for the following:    Sodium 129 (*)    Glucose, Bld 354 (*)    All other components within normal limits  TROPONIN I - Abnormal; Notable for the following:    Troponin I 2.25 (*)    All other components within normal limits  GLUCOSE, CAPILLARY - Abnormal; Notable for the following:    Glucose-Capillary 464 (*)    All other components within normal limits  MRSA PCR SCREENING  TROPONIN I  TROPONIN I  HEMOGLOBIN A1C  LIPID PANEL  CBC  BASIC  METABOLIC PANEL   Imaging Review No results found.  EKG Interpretation    Date/Time:    Ventricular Rate:    PR Interval:    QRS Duration:   QT Interval:    QTC Calculation:   R Axis:     Text Interpretation:              MDM   1. CAD (coronary artery disease)   2. Contrast media allergy   3. DM (diabetes mellitus)   4. HTN (hypertension)   5. S/P angioplasty with stent   6. ST elevation myocardial infarction (STEMI) of inferolateral wall, initial episode of care   7. Tobacco abuse    Patient with hx of CAD presents as a Code STEMI. Patient arrived to ED as cath lab was being prepared. Patient diaphoretic, in pain, PE as above. 12-lead EKG performed, as above. Patient seen and evaluated, written for benadryl given patient's history of contrast dye allergy.   Full evaluation limited as patient was transferred to cath lab approximately 7 minutes after arrival. During stay, patient with good BP, speaking in full sentences, mildly tachy, stable BP. Patient had received 4 nitros in transit, 10 mg morphine with improved pain. Transferred to cath lab in critical condition. Patient seen and evaluated by myself and my attending, Dr. Romeo Apple.Imagene Sheller, MD 09/02/13 [phone]

## 2013-09-01 NOTE — Brief Op Note (Signed)
09/01/2013  8:17 PM  PATIENT:  Robert [name]  62 y.o. male with long-standing CAD & multiple PCIs to Cx, known RCA occlusion.  Last PCI 11/2011.  Was in USOH until this afternoon, developed dyspnea followed by severe 8-9/10 Angina.  Upon EMS arrival Inferolateral STE noted --> Code STEMI called.   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  PCI of intervening segment (overlapping proximal & distal stent) Promus Premier 3.5 mm x 24 mm (post dilated to 4.1 mm proximally & 3.75 mm distally)  PTCA of the existing Proximal stent to 4.1 mm  Minimal LAD disease with known RCA occlusion.  PROCEDURE:  Procedure(s): LEFT HEART CATHETERIZATION WITH CORONARY ANGIOGRAM (N/A) PCI OF NATIVE CIRCUMFLEX - 100% THROMBOTIC ISR  SURGEON:  Surgeon(s) and Role:    * Marykay Lex, MD - Primary  ANESTHESIA:   local and IV sedation; 1 MG vERSED, 50 MCG FENTANYL  EBL:    < 10 ML  PROCEDURE: R Radial ACCESS- 6 Fr; TIG 4.0 for R &L CA Angio --> 6FR XB 3.5 Guide; BMW Wire --> 2.5 mm x 15 mm Pre-dilation balloon x 2 -- Promus Premier DES 3.5 mm x 24 mm --> post-dilated with 4.0 mm [place] balloon.; Angled Pigtail - LV Gram.   LOCAL MEDICATIONS USED:  LIDOCAINE 2ml  TOURNIQUET:  TR BAND -- 2007 hrs, 12 ml  DICTATION: .Note written in EPIC  PLAN OF CARE: Admit to inpatient   PATIENT DISPOSITION:  ICU - extubated and stable.   Marykay Lex, M.D., M.S. [facility] [address] [place], [place]  [address]  [phone] Pager # [phone] 09/01/2013 8:25 PM

## 2013-09-02 ENCOUNTER — Encounter (HOSPITAL_COMMUNITY): Payer: Self-pay

## 2013-09-02 DIAGNOSIS — I2510 Atherosclerotic heart disease of native coronary artery without angina pectoris: Secondary | ICD-10-CM

## 2013-09-02 DIAGNOSIS — I200 Unstable angina: Secondary | ICD-10-CM

## 2013-09-02 DIAGNOSIS — M069 Rheumatoid arthritis, unspecified: Secondary | ICD-10-CM

## 2013-09-02 LAB — POCT I-STAT, CHEM 8
BUN: 11 mg/dL (ref 6–23)
Creatinine, Ser: 0.9 mg/dL (ref 0.50–1.35)
Glucose, Bld: 388 mg/dL — ABNORMAL HIGH (ref 70–99)
Hemoglobin: 19 g/dL — ABNORMAL HIGH (ref 13.0–17.0)
Potassium: 3.8 mEq/L (ref 3.5–5.1)
Sodium: 136 mEq/L (ref 135–145)

## 2013-09-02 LAB — HEMOGLOBIN A1C
Hgb A1c MFr Bld: 10.7 % — ABNORMAL HIGH (ref <5.7)
Mean Plasma Glucose: 260 mg/dL — ABNORMAL HIGH (ref <117)

## 2013-09-02 LAB — BASIC METABOLIC PANEL
CO2: 20 mEq/L (ref 19–32)
Calcium: 9.4 mg/dL (ref 8.4–10.5)
Creatinine, Ser: 0.88 mg/dL (ref 0.50–1.35)
GFR calc Af Amer: >90 mL/min (ref >90)
GFR calc non Af Amer: >90 mL/min (ref >90)
Glucose, Bld: 420 mg/dL — ABNORMAL HIGH (ref 70–99)
Potassium: 4.4 mEq/L (ref 3.5–5.1)

## 2013-09-02 LAB — TROPONIN I
Troponin I: >20 ng/mL (ref <0.30)
Troponin I: >20 ng/mL (ref <0.30)

## 2013-09-02 LAB — LIPID PANEL
Cholesterol: 138 mg/dL (ref 0–200)
HDL: 29 mg/dL — ABNORMAL LOW (ref >39)
Total CHOL/HDL Ratio: 4.8 RATIO
Triglycerides: 322 mg/dL — ABNORMAL HIGH (ref <150)

## 2013-09-02 LAB — CBC
MCH: 34.9 pg — ABNORMAL HIGH (ref 26.0–34.0)
MCV: 95.2 fL (ref 78.0–100.0)
Platelets: 125 10*3/uL — ABNORMAL LOW (ref 150–400)
RBC: 5.45 MIL/uL (ref 4.22–5.81)

## 2013-09-02 LAB — POCT ACTIVATED CLOTTING TIME: Activated Clotting Time: 370 seconds

## 2013-09-02 LAB — GLUCOSE, CAPILLARY
Glucose-Capillary: 355 mg/dL — ABNORMAL HIGH (ref 70–99)
Glucose-Capillary: 379 mg/dL — ABNORMAL HIGH (ref 70–99)

## 2013-09-02 MED ORDER — SODIUM CHLORIDE 0.9 % IV SOLN: INTRAVENOUS | Status: DC

## 2013-09-02 MED ORDER — GUAIFENESIN-DM 100-10 MG/5ML PO SYRP: 5.0000 mL | ORAL_SOLUTION | ORAL | Status: DC | PRN

## 2013-09-02 MED ORDER — NICOTINE 14 MG/24HR TD PT24
14.0000 mg | MEDICATED_PATCH | Freq: Every day | TRANSDERMAL | Status: DC
  Administered 2013-09-02 – 2013-09-03 (×2): 14 mg via TRANSDERMAL
  Filled 2013-09-02 (×3): qty 1

## 2013-09-02 MED ORDER — PNEUMOCOCCAL VAC POLYVALENT 25 MCG/0.5ML IJ INJ
0.5000 mL | INJECTION | INTRAMUSCULAR | Status: AC
  Administered 2013-09-03: 0.5 mL via INTRAMUSCULAR
  Filled 2013-09-02: qty 0.5

## 2013-09-02 MED ORDER — LISINOPRIL 40 MG PO TABS
40.0000 mg | ORAL_TABLET | Freq: Every day | ORAL | Status: DC
  Administered 2013-09-02 – 2013-09-03 (×2): 40 mg via ORAL
  Filled 2013-09-02 (×2): qty 1

## 2013-09-02 MED ORDER — ALPRAZOLAM 0.25 MG PO TABS
0.5000 mg | ORAL_TABLET | Freq: Three times a day (TID) | ORAL | Status: DC | PRN
  Administered 2013-09-02: 0.5 mg via ORAL

## 2013-09-02 MED ORDER — GLIPIZIDE 10 MG PO TABS
10.0000 mg | ORAL_TABLET | Freq: Every day | ORAL | Status: DC
  Administered 2013-09-03: 10 mg via ORAL
  Filled 2013-09-02 (×3): qty 1

## 2013-09-02 NOTE — ED Provider Notes (Signed)
Medical screening examination/treatment/procedure(s) were conducted as a shared visit with resident physician and myself.  I personally evaluated the patient during the encounter.   EKG Interpretation    Date/Time:  Monday September 01 2013 19:02:41 EST Ventricular Rate:  99 PR Interval:  148 QRS Duration: 103 QT Interval:  377 QTC Calculation: 484 R Axis:   -14 Text Interpretation:  Age not entered, assumed to be  62 years old for purpose of ECG interpretation Sinus rhythm Ventricular bigeminy Inferoposterior infarct, acute (RCA) Lateral infarct, acute Probable RV involvement, suggest recording right precordial leads Confirmed by Hazem Kenner  MD, Jewelene Mairena ([id number]) on 09/02/2013 12:05:22 AM           I interviewed and examined the patient. Lungs are CTAB. Cardiac exam wnl. Abdomen soft. Pt remains stable. Will be taken to the cath lab emergently.    Junius Argyle, MD 09/02/13 [phone]

## 2013-09-02 NOTE — Progress Notes (Signed)
CARDIAC REHAB PHASE I   PRE:  Rate/Rhythm: 80 SR  BP:  Supine:   Sitting: 150/81  Standing:    SaO2:   MODE:  Ambulation: 350 ft   POST:  Rate/Rhythm: 82SR  BP:  Supine:   Sitting: 140/81  Standing:    SaO2:  1335-1404 Pt walked 350 ft on RA with steady gait. No CP. Tolerated well. Gave stent booklet. Pt has brilinta booklet. Did not give MI booklet as pt states he has one at home. Discussed smoking cessation and gave handouts. Pt requesting nicotine patch. Will notify RN. To recliner after walk. Call bell in reach.   Luetta Nutting, RN BSN  09/02/2013 1:58 PM

## 2013-09-02 NOTE — Progress Notes (Signed)
Report called to [place], Charity fundraiser. Pt informed of room change.

## 2013-09-02 NOTE — Care Management Note (Signed)
    Page 1 of 1   09/02/2013     9:41:06 AM   CARE MANAGEMENT NOTE 09/02/2013  Patient:  Robert [name], Robert [name]   Account Number:  [id number]  Date Initiated:  09/02/2013  Documentation initiated by:  Junius Creamer  Subjective/Objective Assessment:   adm w mi     Action/Plan:   lives w wife, pcp dr larence fusco.   Anticipated DC Date:     Anticipated DC Plan:  HOME/SELF CARE      DC Planning Services  CM consult  Medication Assistance      Choice offered to / List presented to:             Status of service:   Medicare Important Message given?   (If response is "NO", the following Medicare IM given date fields will be blank) Date Medicare IM given:   Date Additional Medicare IM given:    Discharge Disposition:    Per UR Regulation:  Reviewed for med. necessity/level of care/duration of stay  If discussed at Long Length of Stay Meetings, dates discussed:    Comments:  12/23 0940 debbie Lawson [place] rn,bsn gave pt 30day free brilinta card.

## 2013-09-02 NOTE — Progress Notes (Signed)
Inpatient Diabetes Program Recommendations  AACE/ADA: New Consensus Statement on Inpatient Glycemic Control (2013)  Target Ranges:  Prepandial:   less than 140 mg/dL      Peak postprandial:   less than 180 mg/dL (1-2 hours)      Critically ill patients:  140 - 180 mg/dL   Reason for Visit: Z6X=09.6    Inpatient Diabetes Program Recommendations HgbA1C: =10.7  Diabetes Coordinator spoke with patient concerning A1C elevated.  Patient reports that he was supposed to see an endocrinologist but has not gone yet.  Reports that he will make the appointment after the first of the year because his insurance coverage will be changing.   Will follow. Thank you  Piedad Climes BSN, RN,CDE Inpatient Diabetes Coordinator [phone] (team pager)

## 2013-09-02 NOTE — Progress Notes (Signed)
   Subjective:  Feels relatively well, just has cough that has been bothersome and causing musculoskeletal type chest discomfort but otherwise no more anginal chest discomfort.  Objective:  Vital Signs in the last 24 hours: Temp:  [97.3 F (36.3 C)-98 F (36.7 C)] 97.4 F (36.3 C) (12/23 0800) Pulse Rate:  [76-101] 81 (12/23 1000) Resp:  [14-30] 25 (12/23 1100) BP: (143-169)/(72-122) 152/88 mmHg (12/23 1100) SpO2:  [95 %-100 %] 97 % (12/23 1100) Weight:  [233 lb 11 oz (106 kg)-240 lb (108.863 kg)] 233 lb 11 oz (106 kg) (12/23 0400)  Intake/Output from previous day: 12/22 0701 - 12/23 0700 In: 1540.2 [P.O.:480; I.V.:1060.2] Out: 2350 [Urine:2350] Intake/Output from this shift: Total I/O In: 360 [P.O.:360] Out: 600 [Urine:600]  Physical Exam: General appearance: alert, cooperative, appears stated age and no distress Neck: no adenopathy, no carotid bruit, no JVD and supple, symmetrical, trachea midline Lungs: clear to auscultation bilaterally, normal percussion bilaterally and Prolonged expiratory phase; deep inspiration lead to paroxysm of coughing Heart: regular rate and rhythm, S1, S2 normal, no murmur, click, rub or gallop and normal apical impulse Abdomen: soft, non-tender; bowel sounds normal; no masses,  no organomegaly Extremities: extremities normal, atraumatic, no cyanosis or edema Pulses: 2+ and symmetric Positive reverse Allen's on the right wrist Neurologic: Grossly normal  Lab Results:  Recent Labs  09/01/13 2000 09/02/13 0223  WBC 8.7 12.5*  HGB 18.0* 19.0*  PLT 132* 125*    Recent Labs  09/01/13 2000 09/02/13 0223  NA 129* 133*  K 3.7 4.4  CL 96 98  CO2 21 20  GLUCOSE 354* 420*  BUN 11 14  CREATININE 0.77 0.88    Recent Labs  09/02/13 0222 09/02/13 0903  TROPONINI >20.00* >20.00*   Hepatic Function Panel  Recent Labs  09/01/13 2000  PROT 6.5  ALBUMIN 3.7  AST 28  ALT 34  ALKPHOS 100  BILITOT 0.3    Recent Labs   09/02/13 0223  CHOL 138   No results found for this basename: PROTIME,  in the last 72 hours  Imaging: Imaging results have been reviewed  Cardiac Studies: Cardiac catheterization performed by me. Reviewed  Assessment/Plan:  Principal Problem:   STEMI (ST elevation myocardial infarction) Active Problems:   Type II IDDM   HTN (hypertension)   Tobacco abuse   CAD, MI '95, CFX stent '04, CFX DES 9/06, CFX DES 8/07 with OM POBA   PVD, chronic LLE   Contrast media allergy   ST elevation myocardial infarction (STEMI) of inferolateral wall, initial episode of care  Looks fairly good day on post recurrent inferior lateral STEMI. Had brisk restoration of flow. EF looked relatively preserved on LV gram, would assess an echo today.  Switch from Effient to Brilinta due to in-stent thrombosis. Completed Aggrastat infusion On statin, beta blocker. Blood pressure elevated, he is okay to restart ACE inhibitor. Low-dose diuretic currently being held, would restart on discharge.  He has significant cough, will provide cough medicine. Triggered discussion reference smoking cessation. The decision counseling offered. Will order additional consultation. For now consider using Nicoderm patch.  Restart metformin 48 hours post catheterization, continue on sliding scale insulin. Can restart glipizide tomorrow in this patient for possible discharge on Thursday.  Continue statin. Would restart methotrexate on discharge   LOS: 1 day    HARDING,Robert [name] 09/02/2013, 12:17 PM

## 2013-09-03 DIAGNOSIS — E785 Hyperlipidemia, unspecified: Secondary | ICD-10-CM | POA: Diagnosis present

## 2013-09-03 DIAGNOSIS — Z9889 Other specified postprocedural states: Secondary | ICD-10-CM

## 2013-09-03 DIAGNOSIS — J449 Chronic obstructive pulmonary disease, unspecified: Secondary | ICD-10-CM | POA: Diagnosis present

## 2013-09-03 LAB — GLUCOSE, CAPILLARY
Glucose-Capillary: 332 mg/dL — ABNORMAL HIGH (ref 70–99)
Glucose-Capillary: 316 mg/dL — ABNORMAL HIGH (ref 70–99)

## 2013-09-03 LAB — TROPONIN I: Troponin I: 5.92 ng/mL (ref <0.30)

## 2013-09-03 MED ORDER — METOPROLOL TARTRATE 25 MG PO TABS: 75.0000 mg | ORAL_TABLET | Freq: Two times a day (BID) | ORAL | Status: DC

## 2013-09-03 MED ORDER — GUAIFENESIN-DM 100-10 MG/5ML PO SYRP: 5.0000 mL | ORAL_SOLUTION | ORAL | Status: DC | PRN

## 2013-09-03 MED ORDER — NICOTINE 14 MG/24HR TD PT24: 14.0000 mg | MEDICATED_PATCH | Freq: Every day | TRANSDERMAL | Status: DC

## 2013-09-03 MED ORDER — ACETAMINOPHEN 325 MG PO TABS: 650.0000 mg | ORAL_TABLET | ORAL | Status: DC | PRN

## 2013-09-03 MED ORDER — TICAGRELOR 90 MG PO TABS: 90.0000 mg | ORAL_TABLET | Freq: Two times a day (BID) | ORAL | Status: DC

## 2013-09-03 MED ORDER — METFORMIN HCL 1000 MG PO TABS: 1000.0000 mg | ORAL_TABLET | Freq: Two times a day (BID) | ORAL | Status: DC

## 2013-09-03 MED ORDER — NITROGLYCERIN 0.4 MG SL SUBL: 0.4000 mg | SUBLINGUAL_TABLET | SUBLINGUAL | Status: DC | PRN

## 2013-09-03 NOTE — Progress Notes (Signed)
09/03/13 Nursing note Patient  Given AVS, discharge instructions, and medication list given to patient paper prescriptions given and also medications sent to patients pharmacy to be picked up. Will discharge home as ordered. Laiah Pouncey, [employer]

## 2013-09-03 NOTE — Progress Notes (Signed)
CARDIAC REHAB PHASE I   PRE:  Rate/Rhythm: 77SR  BP:  Supine:   Sitting: 98/70  Standing:    SaO2: 95%RA  MODE:  Ambulation: 550 ft   POST:  Rate/Rhythm: 79SR  BP:  Supine:   Sitting: 100/62  Standing:    SaO2: 95%RA 0938-1015 Pt walked 550 ft on RA with steady gait. Tolerated well. No CP. Education completed. Pt has not been counting carbs so reviewed carb counting and gave heart healthy and diabetic diets. Pt had requested fake cigarette yesterday and I was able to find one and give to him. He stated he thinks he will continue nicotine patches after discharge. BP lower today but no dizziness. Wants to go home.   Luetta Nutting, RN BSN  09/03/2013 10:11 AM

## 2013-09-03 NOTE — Progress Notes (Signed)
Subjective:  No chest pain- he wants to go home  Objective:  Vital Signs in the last 24 hours: Temp:  [97.7 F (36.5 C)-98 F (36.7 C)] 98 F (36.7 C) (12/24 0500) Pulse Rate:  [71-78] 71 (12/24 0500) Resp:  [18-25] 18 (12/24 0500) BP: (116-158)/(70-88) 116/70 mmHg (12/24 0500) SpO2:  [93 %-97 %] 97 % (12/24 0500) Weight:  [234 lb 9.1 oz (106.4 kg)] 234 lb 9.1 oz (106.4 kg) (12/24 0500)  Intake/Output from previous day:  Intake/Output Summary (Last 24 hours) at 09/03/13 1025 Last data filed at 09/02/13 1700  Gross per 24 hour  Intake    720 ml  Output   1100 ml  Net   -380 ml    Physical Exam: General appearance: alert, cooperative and no distress Lungs: clear to auscultation bilaterally Heart: regular rate and rhythm Extremities: Rt wrist without hematoma   Rate: 72  Rhythm: normal sinus rhythm  Lab Results:  Recent Labs  09/01/13 2000 09/02/13 0223  WBC 8.7 12.5*  HGB 18.0* 19.0*  PLT 132* 125*    Recent Labs  09/01/13 2000 09/02/13 0223  NA 129* 133*  K 3.7 4.4  CL 96 98  CO2 21 20  GLUCOSE 354* 420*  BUN 11 14  CREATININE 0.77 0.88    Recent Labs  09/02/13 0222 09/02/13 0903  TROPONINI >20.00* >20.00*   No results found for this basename: INR,  in the last 72 hours  Imaging: Imaging results have been reviewed  Cardiac Studies:  Assessment/Plan:   Principal Problem:   STEMI 09/01/13 Active Problems:   S/P stent of LCX 05/16/12,in-stent restenosis March 2014 POBA, ISR 09/01/13- overlapping DES placed   Type II IDDM   Tobacco abuse   Contrast media allergy   HTN (hypertension)   PVD, chronic LLE   Dyslipidemia- LDL 45 09/01/13   COPD (chronic obstructive pulmonary disease)   PLAN: MD to see- he is day #2  After STEMI, Troponin > 20, but doing well. Effient changed to Brillinta. Check echo as OP.  Corine Shelter PA-C Beeper [phone] 09/03/2013, 10:25 AM  I seen and examined patient along with Corine Shelter, PA. Agree with his  findings, examination as well as recommendations. He looks remarkably stable day 2 post inferolateral STEMI with PCI. His first medical contact or balloon time was roughly 90 minutes, that does make a relatively low risk despite this is a repeat MI for him. He has remained hemodynamically stable with no arrhythmias. He has had no heart failure symptoms we've adjusted some of his medications including switching antiplatelet agent.. He has had no bleeding.   He is ambulated without any problems with cardiac rehabilitation. Unfortunately, he does not think his rheumatoid arthritis symptoms will allow him to repeat the cardiac rehabilitation program. But he is willing to do whatever he is able to do on his own in order to rehabilitate.  I also spent a least 5 minutes talking about the importance of smoking cessation. He is agreed to use Nicoderm patches on discharge.  Overall, based on how stable he is been since his MI, I see no reason not to proceed with fast-track discharge, provided his labs for today are stable.  Labs are still pending at this time, making 11 AM discharge time impossible.  Marykay Lex, M.D., M.S. [facility] [address] [place], [place]  [address]  [phone] Pager # [phone] 09/03/2013 12:10 PM

## 2013-09-03 NOTE — Discharge Summary (Signed)
Patient ID: Robert [name],  MRN: [id number], DOB/AGE: August 31, 1951 62 y.o.  Admit date: 09/01/2013 Discharge date: 09/03/2013  Primary Care Provider: Dr Sherwood Gambler Primary Cardiologist: Dr Royann Shivers  Discharge Diagnoses Principal Problem:   STEMI 09/01/13 Active Problems:   S/P stent of LCX 05/16/12,in-stent restenosis March 2014 POBA, ISR 09/01/13- overlapping DES placed   Type II IDDM   Tobacco abuse   Contrast media allergy   HTN (hypertension)   PVD, chronic LLE   Dyslipidemia- LDL 45 09/01/13   COPD (chronic obstructive pulmonary disease)    Procedures: Cath/PCI 09/01/13   Hospital Course: 62 y/o male with a long history of CAD and prior PCIs.    Cardiac History:  cath 2004 showed total occlusion of RCA and intermediate lesions LAD, high grade mid LCX treated with 4x18 mm BMS (Velocity).  - 2006 overlapping 3.5x23 mm Cypher inLCX  - 2007 new downstream stenosis at OM bifurcation treated with 3x18 mm Cypher stent  - 2008 acute stent thrombosis (allergic to plavix, temporarily stopped Ticlid) - thrombolysis/aspiration only  - April 2013 cutting balloon angioplasty for proximal LCX in stent restenosis (Dr. Tresa Endo)  - September 2013 repeat restenosis treated with "stent in stent" Xience Expedition 3.25 x 23 mm (Dr. Allyson Sabal).  - March 2014 left circumflex coronary artery PCI for restenosis, utilizing cutting balloon atherotomy/noncompliant balloon dilatation up to 4.08 mm with the 80% to 95% stenosis being reduced to 0-less than 10%.  - No change in RCA or LAD anatomy during last several years.  -Allergic to iodinated contrast, but did well with premedication. Allergic to clopidogrel but tolerates Effient well.   He was admitted as a code STEMI 09/01/13. He had cath and subsequent CFX PCI/DES for ISR. See Dr Elissa Hefty complete procedure note for details. He tolerated this procedure well. His Troponin was > 20. On 12/24 he was anxious to go home. Follow up Troponin was down to 5. Dr [name]  felt he could be discharged. We did change his Effient to Brilinta. He'll follow up in a week or two as an OP.  Discharge Vitals:  Blood pressure 107/76, pulse 71, temperature 98 F (36.7 C), temperature source Oral, resp. rate 18, height 6' (1.829 m), weight 234 lb 9.1 oz (106.4 kg), SpO2 97.00%.    Labs: Results for orders placed during the hospital encounter of 09/01/13 (from the past 48 hour(s))  POCT I-STAT, CHEM 8     Status: Abnormal   Collection Time    09/01/13  7:30 PM      Result Value Range   Sodium 136  135 - 145 mEq/L   Potassium 3.8  3.5 - 5.1 mEq/L   Chloride 101  96 - 112 mEq/L   BUN 11  6 - 23 mg/dL   Creatinine, Ser 2.84  0.50 - 1.35 mg/dL   Glucose, Bld 132 (*) 70 - 99 mg/dL   Calcium, Ion 4.40  1.02 - 1.30 mmol/L   TCO2 21  0 - 100 mmol/L   Hemoglobin 19.0 (*) 13.0 - 17.0 g/dL   HCT 72.5 (*) 36.6 - 44.0 %  POCT ACTIVATED CLOTTING TIME     Status: None   Collection Time    09/01/13  7:33 PM      Result Value Range   Activated Clotting Time 370    CBC     Status: Abnormal   Collection Time    09/01/13  8:00 PM      Result Value Range   WBC 8.7  4.0 -  10.5 K/uL   RBC 5.16  4.22 - 5.81 MIL/uL   Hemoglobin 18.0 (*) 13.0 - 17.0 g/dL   HCT 95.6  21.3 - 08.6 %   MCV 95.3  78.0 - 100.0 fL   MCH 34.9 (*) 26.0 - 34.0 pg   MCHC 36.6 (*) 30.0 - 36.0 g/dL   RDW 57.8  46.9 - 62.9 %   Platelets 132 (*) 150 - 400 K/uL  COMPREHENSIVE METABOLIC PANEL     Status: Abnormal   Collection Time    09/01/13  8:00 PM      Result Value Range   Sodium 129 (*) 135 - 145 mEq/L   Potassium 3.7  3.5 - 5.1 mEq/L   Chloride 96  96 - 112 mEq/L   CO2 21  19 - 32 mEq/L   Glucose, Bld 354 (*) 70 - 99 mg/dL   BUN 11  6 - 23 mg/dL   Creatinine, Ser 5.28  0.50 - 1.35 mg/dL   Calcium 8.8  8.4 - 41.3 mg/dL   Total Protein 6.5  6.0 - 8.3 g/dL   Albumin 3.7  3.5 - 5.2 g/dL   AST 28  0 - 37 U/L   ALT 34  0 - 53 U/L   Alkaline Phosphatase 100  39 - 117 U/L   Total Bilirubin 0.3  0.3 -  1.2 mg/dL   GFR calc non Af Amer >90  >90 mL/min   GFR calc Af Amer >90  >90 mL/min   Comment: (NOTE)     The eGFR has been calculated using the CKD EPI equation.     This calculation has not been validated in all clinical situations.     eGFR's persistently <90 mL/min signify possible Chronic Kidney     Disease.  TROPONIN I     Status: Abnormal   Collection Time    09/01/13  8:00 PM      Result Value Range   Troponin I 2.25 (*) <0.30 ng/mL   Comment:            Due to the release kinetics of cTnI,     a negative result within the first hours     of the onset of symptoms does not rule out     myocardial infarction with certainty.     If myocardial infarction is still suspected,     repeat the test at appropriate intervals.     CRITICAL RESULT CALLED TO, READ BACK BY AND VERIFIED WITH:     GREEN,A RN 09/01/2013 [address]  HEMOGLOBIN A1C     Status: Abnormal   Collection Time    09/01/13  8:00 PM      Result Value Range   Hemoglobin A1C 10.7 (*) <5.7 %   Comment: (NOTE)                                                                               According to the ADA Clinical Practice Recommendations for 2011, when     HbA1c is used as a screening test:      >=6.5%   Diagnostic of Diabetes Mellitus               (  if abnormal result is confirmed)     5.7-6.4%   Increased risk of developing Diabetes Mellitus     References:Diagnosis and Classification of Diabetes Mellitus,Diabetes     Care,2011,34(Suppl 1):S62-S69 and Standards of Medical Care in             Diabetes - 2011,Diabetes Care,2011,34 (Suppl 1):S11-S61.   Mean Plasma Glucose 260 (*) <117 mg/dL   Comment: Performed at [facility]  MRSA PCR SCREENING     Status: None   Collection Time    09/01/13  8:39 PM      Result Value Range   MRSA by PCR NEGATIVE  NEGATIVE   Comment:            The GeneXpert MRSA Assay (FDA     approved for NASAL specimens     only), is one component of a     comprehensive MRSA  colonization     surveillance program. It is not     intended to diagnose MRSA     infection nor to guide or     monitor treatment for     MRSA infections.  GLUCOSE, CAPILLARY     Status: Abnormal   Collection Time    09/01/13  9:57 PM      Result Value Range   Glucose-Capillary 464 (*) 70 - 99 mg/dL  TROPONIN I     Status: Abnormal   Collection Time    09/02/13  2:22 AM      Result Value Range   Troponin I >20.00 (*) <0.30 ng/mL   Comment:            Due to the release kinetics of cTnI,     a negative result within the first hours     of the onset of symptoms does not rule out     myocardial infarction with certainty.     If myocardial infarction is still suspected,     repeat the test at appropriate intervals.     CRITICAL VALUE NOTED.  VALUE IS CONSISTENT WITH PREVIOUSLY REPORTED AND CALLED VALUE.  LIPID PANEL     Status: Abnormal   Collection Time    09/02/13  2:23 AM      Result Value Range   Cholesterol 138  0 - 200 mg/dL   Triglycerides 409 (*) <150 mg/dL   HDL 29 (*) >81 mg/dL   Total CHOL/HDL Ratio 4.8     VLDL 64 (*) 0 - 40 mg/dL   LDL Cholesterol 45  0 - 99 mg/dL   Comment:            Total Cholesterol/HDL:CHD Risk     Coronary Heart Disease Risk Table                         Men   Women      1/2 Average Risk   3.4   3.3      Average Risk       5.0   4.4      2 X Average Risk   9.6   7.1      3 X Average Risk  23.4   11.0                Use the calculated Patient Ratio     above and the CHD Risk Table     to determine the patient's CHD Risk.  ATP III CLASSIFICATION (LDL):      <100     mg/dL   Optimal      130-865  mg/dL   Near or Above                        Optimal      130-159  mg/dL   Borderline      784-696  mg/dL   High      >295     mg/dL   Very High  CBC     Status: Abnormal   Collection Time    09/02/13  2:23 AM      Result Value Range   WBC 12.5 (*) 4.0 - 10.5 K/uL   RBC 5.45  4.22 - 5.81 MIL/uL   Hemoglobin 19.0 (*) 13.0  - 17.0 g/dL   HCT 28.4  13.2 - 44.0 %   MCV 95.2  78.0 - 100.0 fL   MCH 34.9 (*) 26.0 - 34.0 pg   MCHC 36.6 (*) 30.0 - 36.0 g/dL   RDW 10.2  72.5 - 36.6 %   Platelets 125 (*) 150 - 400 K/uL  BASIC METABOLIC PANEL     Status: Abnormal   Collection Time    09/02/13  2:23 AM      Result Value Range   Sodium 133 (*) 135 - 145 mEq/L   Potassium 4.4  3.5 - 5.1 mEq/L   Chloride 98  96 - 112 mEq/L   CO2 20  19 - 32 mEq/L   Glucose, Bld 420 (*) 70 - 99 mg/dL   BUN 14  6 - 23 mg/dL   Creatinine, Ser 4.40  0.50 - 1.35 mg/dL   Calcium 9.4  8.4 - 34.7 mg/dL   GFR calc non Af Amer >90  >90 mL/min   GFR calc Af Amer >90  >90 mL/min   Comment: (NOTE)     The eGFR has been calculated using the CKD EPI equation.     This calculation has not been validated in all clinical situations.     eGFR's persistently <90 mL/min signify possible Chronic Kidney     Disease.  GLUCOSE, CAPILLARY     Status: Abnormal   Collection Time    09/02/13  8:09 AM      Result Value Range   Glucose-Capillary 355 (*) 70 - 99 mg/dL  TROPONIN I     Status: Abnormal   Collection Time    09/02/13  9:03 AM      Result Value Range   Troponin I >20.00 (*) <0.30 ng/mL   Comment:            Due to the release kinetics of cTnI,     a negative result within the first hours     of the onset of symptoms does not rule out     myocardial infarction with certainty.     If myocardial infarction is still suspected,     repeat the test at appropriate intervals.     CRITICAL VALUE NOTED.  VALUE IS CONSISTENT WITH PREVIOUSLY REPORTED AND CALLED VALUE.  GLUCOSE, CAPILLARY     Status: Abnormal   Collection Time    09/02/13 12:59 PM      Result Value Range   Glucose-Capillary 379 (*) 70 - 99 mg/dL  GLUCOSE, CAPILLARY     Status: Abnormal   Collection Time    09/02/13  4:48 PM      Result Value  Range   Glucose-Capillary 234 (*) 70 - 99 mg/dL   Comment 1 Notify RN     Comment 2 Documented in Chart    GLUCOSE, CAPILLARY     Status:  Abnormal   Collection Time    09/02/13  9:15 PM      Result Value Range   Glucose-Capillary 391 (*) 70 - 99 mg/dL  GLUCOSE, CAPILLARY     Status: Abnormal   Collection Time    09/03/13  6:03 AM      Result Value Range   Glucose-Capillary 332 (*) 70 - 99 mg/dL  TROPONIN I     Status: Abnormal   Collection Time    09/03/13 10:56 AM      Result Value Range   Troponin I 5.92 (*) <0.30 ng/mL   Comment:            Due to the release kinetics of cTnI,     a negative result within the first hours     of the onset of symptoms does not rule out     myocardial infarction with certainty.     If myocardial infarction is still suspected,     repeat the test at appropriate intervals.     CRITICAL VALUE NOTED.  VALUE IS CONSISTENT WITH PREVIOUSLY REPORTED AND CALLED VALUE.  GLUCOSE, CAPILLARY     Status: Abnormal   Collection Time    09/03/13 11:14 AM      Result Value Range   Glucose-Capillary 316 (*) 70 - 99 mg/dL    Disposition:  Follow-up Information   Follow up with HARDING,DAVID W, MD. (office will call you)    Specialty:  Cardiology   Contact information:   [address] [place] [address] [phone]       Discharge Medications:    Medication List    STOP taking these medications       cilostazol 100 MG tablet  Commonly known as:  PLETAL     furosemide 20 MG tablet  Commonly known as:  LASIX     prasugrel 10 MG Tabs tablet  Commonly known as:  EFFIENT     predniSONE 5 MG Tabs tablet  Commonly known as:  STERAPRED UNI-PAK      TAKE these medications       acetaminophen 325 MG tablet  Commonly known as:  TYLENOL  Take 2 tablets (650 mg total) by mouth every 4 (four) hours as needed for headache or mild pain.     ALPRAZolam 0.5 MG tablet  Commonly known as:  XANAX  Take 0.5 mg by mouth 3 (three) times daily as needed. For anxiety     aspirin 81 MG EC tablet  Take 1 tablet (81 mg total) by mouth daily.     buPROPion 75 MG tablet   Commonly known as:  WELLBUTRIN  Take 1 tablet (75 mg total) by mouth 2 (two) times daily.     fish oil-omega-3 fatty acids 1000 MG capsule  Take 1 g by mouth daily.     folic acid 1 MG tablet  Commonly known as:  FOLVITE  Take 5 mg by mouth daily.     glipiZIDE 10 MG 24 hr tablet  Commonly known as:  GLUCOTROL XL  Take 10 mg by mouth 2 (two) times daily.     guaiFENesin-dextromethorphan 100-10 MG/5ML syrup  Commonly known as:  ROBITUSSIN DM  Take 5 mLs by mouth every 4 (four) hours as needed for cough.  insulin glargine 100 UNIT/ML injection  Commonly known as:  LANTUS  Inject 20 Units into the skin 2 (two) times daily.     isosorbide mononitrate 60 MG 24 hr tablet  Commonly known as:  IMDUR  Take 1 tablet (60 mg total) by mouth daily.     metFORMIN 1000 MG tablet  Commonly known as:  GLUCOPHAGE  Take 1 tablet (1,000 mg total) by mouth 2 (two) times daily with a meal.  Start taking on:  09/04/2013     methotrexate 2.5 MG tablet  Commonly known as:  RHEUMATREX  Take 25 mg by mouth once a week. Caution:Chemotherapy. Protect from light. ON THURSDAYS     metoprolol tartrate 25 MG tablet  Commonly known as:  LOPRESSOR  Take 3 tablets (75 mg total) by mouth 2 (two) times daily.     nicotine 14 mg/24hr patch  Commonly known as:  NICODERM CQ - dosed in mg/24 hours  Place 1 patch (14 mg total) onto the skin daily.     nitroGLYCERIN 0.4 MG SL tablet  Commonly known as:  NITROSTAT  Place 1 tablet (0.4 mg total) under the tongue every 5 (five) minutes x 3 doses as needed for chest pain.     quinapril 40 MG tablet  Commonly known as:  ACCUPRIL  Take 1 tablet (40 mg total) by mouth at bedtime.     simvastatin 40 MG tablet  Commonly known as:  ZOCOR  Take 1 tablet (40 mg total) by mouth at bedtime.     Ticagrelor 90 MG Tabs tablet  Commonly known as:  BRILINTA  Take 1 tablet (90 mg total) by mouth 2 (two) times daily.     Tocilizumab 162 MG/0.9ML Sosy  Inject 162 mg  into the skin once a week. On tuesdays.         Duration of Discharge Encounter: Greater than 30 minutes including physician time.  Jolene Provost PA-C 09/03/2013 2:02 PM

## 2013-09-03 NOTE — Progress Notes (Signed)
Inpatient Diabetes Program Recommendations  AACE/ADA: New Consensus Statement on Inpatient Glycemic Control (2013)  Target Ranges:  Prepandial:   less than 140 mg/dL      Peak postprandial:   less than 180 mg/dL (1-2 hours)      Critically ill patients:  140 - 180 mg/dL   Results for Robert [name], Robert [name] (MRN [id number]) as of 09/03/2013 08:38  Ref. Range 09/02/2013 08:09 09/02/2013 12:59 09/02/2013 16:48 09/02/2013 21:15 09/03/2013 06:03  Glucose-Capillary Latest Range: 70-99 mg/dL 409 (H) 811 (H) 914 (H) 391 (H) 332 (H)   Inpatient Diabetes Program Recommendations Insulin - Basal: Please consider increasing Lantus to 25 units BID. Correction (SSI): Please consider adding Novolog bedtime correction scale.  Note: Blood glucose ranged from 234-391 mg/dl on 78/29 and fasting glucose this morning is 332 mg/dl.  Please increase Lantus to 25 units BID and add Novolog bedtime correction.  Will continue to follow.  Thanks, Orlando Penner, RN, MSN, CCRN Diabetes Coordinator Inpatient Diabetes Program [phone] (Team Pager) [phone] ([facility] office) [phone] [facility] office)

## 2013-09-03 NOTE — Discharge Summary (Signed)
Stable day 2 s/p Inf-Lat STEMI with appropriate drop in troponin.  Doing quite well post PCI [facility] to Balloon ~90 min).   Initial plan was standard d/c time, but with no CHF or arrhythmia OK for FAST Track Discharge today.   Will arrange f/u.  Medication adjustments made.  Marykay Lex, MD

## 2013-09-18 ENCOUNTER — Emergency Department (HOSPITAL_COMMUNITY): Payer: Medicare HMO

## 2013-09-18 ENCOUNTER — Encounter (HOSPITAL_COMMUNITY): Payer: Self-pay | Admitting: Emergency Medicine

## 2013-09-18 ENCOUNTER — Observation Stay (HOSPITAL_COMMUNITY)
Admission: EM | Admit: 2013-09-18 | Discharge: 2013-09-19 | Disposition: A | Discharge status: Home / Self Care | Payer: Medicare HMO | Attending: Internal Medicine | Admitting: Internal Medicine

## 2013-09-18 DIAGNOSIS — I200 Unstable angina: Secondary | ICD-10-CM | POA: Insufficient documentation

## 2013-09-18 DIAGNOSIS — Z794 Long term (current) use of insulin: Secondary | ICD-10-CM | POA: Insufficient documentation

## 2013-09-18 DIAGNOSIS — Z9889 Other specified postprocedural states: Secondary | ICD-10-CM | POA: Insufficient documentation

## 2013-09-18 DIAGNOSIS — F17200 Nicotine dependence, unspecified, uncomplicated: Secondary | ICD-10-CM | POA: Insufficient documentation

## 2013-09-18 DIAGNOSIS — R0789 Other chest pain: Principal | ICD-10-CM | POA: Insufficient documentation

## 2013-09-18 DIAGNOSIS — F0390 Unspecified dementia without behavioral disturbance: Secondary | ICD-10-CM | POA: Insufficient documentation

## 2013-09-18 DIAGNOSIS — Z888 Allergy status to other drugs, medicaments and biological substances status: Secondary | ICD-10-CM | POA: Insufficient documentation

## 2013-09-18 DIAGNOSIS — Z79899 Other long term (current) drug therapy: Secondary | ICD-10-CM | POA: Insufficient documentation

## 2013-09-18 DIAGNOSIS — I213 ST elevation (STEMI) myocardial infarction of unspecified site: Secondary | ICD-10-CM

## 2013-09-18 DIAGNOSIS — I739 Peripheral vascular disease, unspecified: Secondary | ICD-10-CM

## 2013-09-18 DIAGNOSIS — Z720 Tobacco use: Secondary | ICD-10-CM | POA: Diagnosis present

## 2013-09-18 DIAGNOSIS — I499 Cardiac arrhythmia, unspecified: Secondary | ICD-10-CM | POA: Insufficient documentation

## 2013-09-18 DIAGNOSIS — Z9861 Coronary angioplasty status: Secondary | ICD-10-CM | POA: Insufficient documentation

## 2013-09-18 DIAGNOSIS — M069 Rheumatoid arthritis, unspecified: Secondary | ICD-10-CM | POA: Insufficient documentation

## 2013-09-18 DIAGNOSIS — J449 Chronic obstructive pulmonary disease, unspecified: Secondary | ICD-10-CM | POA: Diagnosis present

## 2013-09-18 DIAGNOSIS — Z91041 Radiographic dye allergy status: Secondary | ICD-10-CM | POA: Insufficient documentation

## 2013-09-18 DIAGNOSIS — I10 Essential (primary) hypertension: Secondary | ICD-10-CM

## 2013-09-18 DIAGNOSIS — E119 Type 2 diabetes mellitus without complications: Secondary | ICD-10-CM | POA: Insufficient documentation

## 2013-09-18 DIAGNOSIS — I509 Heart failure, unspecified: Secondary | ICD-10-CM

## 2013-09-18 DIAGNOSIS — J45909 Unspecified asthma, uncomplicated: Secondary | ICD-10-CM | POA: Insufficient documentation

## 2013-09-18 DIAGNOSIS — Z95820 Peripheral vascular angioplasty status with implants and grafts: Secondary | ICD-10-CM

## 2013-09-18 DIAGNOSIS — R079 Chest pain, unspecified: Secondary | ICD-10-CM

## 2013-09-18 DIAGNOSIS — I2129 ST elevation (STEMI) myocardial infarction involving other sites: Secondary | ICD-10-CM | POA: Insufficient documentation

## 2013-09-18 DIAGNOSIS — E7800 Pure hypercholesterolemia, unspecified: Secondary | ICD-10-CM | POA: Insufficient documentation

## 2013-09-18 DIAGNOSIS — I2510 Atherosclerotic heart disease of native coronary artery without angina pectoris: Secondary | ICD-10-CM

## 2013-09-18 DIAGNOSIS — Z7982 Long term (current) use of aspirin: Secondary | ICD-10-CM | POA: Insufficient documentation

## 2013-09-18 LAB — POCT I-STAT, CHEM 8
BUN: 13 mg/dL (ref 6–23)
CALCIUM ION: 1.2 mmol/L (ref 1.13–1.30)
CHLORIDE: 102 meq/L (ref 96–112)
Creatinine, Ser: 1 mg/dL (ref 0.50–1.35)
Glucose, Bld: 300 mg/dL — ABNORMAL HIGH (ref 70–99)
HEMATOCRIT: 52 % (ref 39.0–52.0)
Hemoglobin: 17.7 g/dL — ABNORMAL HIGH (ref 13.0–17.0)
POTASSIUM: 4.2 meq/L (ref 3.7–5.3)
SODIUM: 138 meq/L (ref 137–147)
TCO2: 25 mmol/L (ref 0–100)

## 2013-09-18 LAB — POCT I-STAT TROPONIN I: Troponin i, poc: 0 ng/mL (ref 0.00–0.08)

## 2013-09-18 MED ORDER — ASPIRIN 81 MG PO CHEW
324.0000 mg | CHEWABLE_TABLET | Freq: Once | ORAL | Status: DC
Start: 2013-09-18 — End: 2013-09-19

## 2013-09-18 MED ORDER — HEPARIN SODIUM (PORCINE) 5000 UNIT/ML IJ SOLN: 60.0000 [IU]/kg | Freq: Once | INTRAMUSCULAR | Status: DC
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
## 2013-09-19 DIAGNOSIS — R079 Chest pain, unspecified: Secondary | ICD-10-CM

## 2013-09-19 DIAGNOSIS — I10 Essential (primary) hypertension: Secondary | ICD-10-CM

## 2013-09-19 LAB — CBC
HCT: 48.4 % (ref 39.0–52.0)
HCT: 47.7 % (ref 39.0–52.0)
HEMOGLOBIN: 17.4 g/dL — AB (ref 13.0–17.0)
Hemoglobin: 16.9 g/dL (ref 13.0–17.0)
MCH: 33.5 pg (ref 26.0–34.0)
MCH: 33.3 pg (ref 26.0–34.0)
MCHC: 36 g/dL (ref 30.0–36.0)
MCHC: 35.4 g/dL (ref 30.0–36.0)
MCV: 93.3 fL (ref 78.0–100.0)
MCV: 93.9 fL (ref 78.0–100.0)
PLATELETS: 122 10*3/uL — AB (ref 150–400)
PLATELETS: DECREASED 10*3/uL (ref 150–400)
RBC: 5.19 MIL/uL (ref 4.22–5.81)
RBC: 5.08 MIL/uL (ref 4.22–5.81)
RDW: 13.5 % (ref 11.5–15.5)
RDW: 13.5 % (ref 11.5–15.5)
WBC: 8.1 10*3/uL (ref 4.0–10.5)
WBC: 8 10*3/uL (ref 4.0–10.5)

## 2013-09-19 LAB — BASIC METABOLIC PANEL
BUN: 13 mg/dL (ref 6–23)
BUN: 13 mg/dL (ref 6–23)
CALCIUM: 9.2 mg/dL (ref 8.4–10.5)
CO2: 24 mEq/L (ref 19–32)
CO2: 24 mEq/L (ref 19–32)
Calcium: 9.7 mg/dL (ref 8.4–10.5)
Chloride: 99 mEq/L (ref 96–112)
Chloride: 103 mEq/L (ref 96–112)
Creatinine, Ser: 0.86 mg/dL (ref 0.50–1.35)
Creatinine, Ser: 0.99 mg/dL (ref 0.50–1.35)
GFR, EST NON AFRICAN AMERICAN: 87 mL/min — AB (ref >90)
Glucose, Bld: 297 mg/dL — ABNORMAL HIGH (ref 70–99)
Glucose, Bld: 176 mg/dL — ABNORMAL HIGH (ref 70–99)
POTASSIUM: 4 meq/L (ref 3.7–5.3)
Potassium: 3.8 mEq/L (ref 3.7–5.3)
SODIUM: 138 meq/L (ref 137–147)
SODIUM: 140 meq/L (ref 137–147)

## 2013-09-19 LAB — DIFFERENTIAL
Basophils Absolute: 0.1 10*3/uL (ref 0.0–0.1)
Basophils Relative: 1 % (ref 0–1)
Eosinophils Absolute: 0.1 10*3/uL (ref 0.0–0.7)
Eosinophils Relative: 2 % (ref 0–5)
LYMPHS ABS: 1.8 10*3/uL (ref 0.7–4.0)
LYMPHS PCT: 22 % (ref 12–46)
MONOS PCT: 8 % (ref 3–12)
Monocytes Absolute: 0.6 10*3/uL (ref 0.1–1.0)
NEUTROS PCT: 68 % (ref 43–77)
Neutro Abs: 5.5 10*3/uL (ref 1.7–7.7)

## 2013-09-19 LAB — GLUCOSE, CAPILLARY
GLUCOSE-CAPILLARY: 167 mg/dL — AB (ref 70–99)
Glucose-Capillary: 267 mg/dL — ABNORMAL HIGH (ref 70–99)

## 2013-09-19 LAB — PROTIME-INR
INR: 0.98 (ref 0.00–1.49)
Prothrombin Time: 12.8 seconds (ref 11.6–15.2)

## 2013-09-19 LAB — TROPONIN I: Troponin I: <0.3 ng/mL (ref <0.30)

## 2013-09-19 LAB — APTT: aPTT: 30 seconds (ref 24–37)

## 2013-09-19 MED ORDER — ONDANSETRON HCL 4 MG/2ML IJ SOLN: 4.0000 mg | Freq: Four times a day (QID) | INTRAMUSCULAR | Status: DC | PRN

## 2013-09-19 MED ORDER — ACETAMINOPHEN 325 MG PO TABS: 650.0000 mg | ORAL_TABLET | ORAL | Status: DC | PRN

## 2013-09-19 MED ORDER — ALPRAZOLAM 0.5 MG PO TABS: 0.5000 mg | ORAL_TABLET | Freq: Three times a day (TID) | ORAL | Status: DC | PRN

## 2013-09-19 MED ORDER — QUINAPRIL HCL 10 MG PO TABS: 40.0000 mg | ORAL_TABLET | Freq: Every day | ORAL | Status: DC

## 2013-09-19 MED ORDER — ASPIRIN EC 81 MG PO TBEC: 81.0000 mg | DELAYED_RELEASE_TABLET | Freq: Every day | ORAL | Status: DC

## 2013-09-19 MED ORDER — NITROGLYCERIN 0.4 MG SL SUBL: 0.4000 mg | SUBLINGUAL_TABLET | SUBLINGUAL | Status: DC | PRN

## 2013-09-19 MED ORDER — INSULIN ASPART 100 UNIT/ML ~~LOC~~ SOLN
0.0000 [IU] | Freq: Every day | SUBCUTANEOUS | Status: DC
  Administered 2013-09-19: 3 [IU] via SUBCUTANEOUS

## 2013-09-19 MED ORDER — OMEGA-3-ACID ETHYL ESTERS 1 G PO CAPS
1.0000 g | ORAL_CAPSULE | Freq: Every day | ORAL | Status: DC
  Filled 2013-09-19: qty 1

## 2013-09-19 MED ORDER — LISINOPRIL 40 MG PO TABS
40.0000 mg | ORAL_TABLET | Freq: Every day | ORAL | Status: DC
  Filled 2013-09-19 (×2): qty 1

## 2013-09-19 MED ORDER — QUINAPRIL HCL 40 MG PO TABS: 20.0000 mg | ORAL_TABLET | Freq: Every day | ORAL | Status: DC

## 2013-09-19 MED ORDER — BUPROPION HCL 75 MG PO TABS
75.0000 mg | ORAL_TABLET | Freq: Two times a day (BID) | ORAL | Status: DC
  Administered 2013-09-19: 75 mg via ORAL
  Filled 2013-09-19 (×3): qty 1

## 2013-09-19 MED ORDER — ASPIRIN EC 81 MG PO TBEC
81.0000 mg | DELAYED_RELEASE_TABLET | Freq: Every day | ORAL | Status: DC
  Filled 2013-09-19: qty 1

## 2013-09-19 MED ORDER — SIMVASTATIN 40 MG PO TABS
40.0000 mg | ORAL_TABLET | Freq: Every day | ORAL | Status: DC
  Filled 2013-09-19 (×2): qty 1

## 2013-09-19 MED ORDER — OMEGA-3 FATTY ACIDS 1000 MG PO CAPS: 1.0000 g | ORAL_CAPSULE | Freq: Every day | ORAL | Status: DC

## 2013-09-19 MED ORDER — INSULIN ASPART 100 UNIT/ML ~~LOC~~ SOLN
0.0000 [IU] | Freq: Three times a day (TID) | SUBCUTANEOUS | Status: DC
  Administered 2013-09-19: 3 [IU] via SUBCUTANEOUS

## 2013-09-19 MED ORDER — HEPARIN SODIUM (PORCINE) 5000 UNIT/ML IJ SOLN
5000.0000 [IU] | Freq: Three times a day (TID) | INTRAMUSCULAR | Status: DC
  Administered 2013-09-19: 5000 [IU] via SUBCUTANEOUS
  Filled 2013-09-19 (×4): qty 1

## 2013-09-19 MED ORDER — NICOTINE 14 MG/24HR TD PT24
14.0000 mg | MEDICATED_PATCH | Freq: Every day | TRANSDERMAL | Status: DC
Start: 2013-09-19 — End: 2013-09-19
  Filled 2013-09-19: qty 1

## 2013-09-19 MED ORDER — ISOSORBIDE MONONITRATE ER 60 MG PO TB24
60.0000 mg | ORAL_TABLET | Freq: Every day | ORAL | Status: DC
  Filled 2013-09-19: qty 1

## 2013-09-19 MED ORDER — INSULIN GLARGINE 100 UNIT/ML ~~LOC~~ SOLN
20.0000 [IU] | Freq: Two times a day (BID) | SUBCUTANEOUS | Status: DC
  Administered 2013-09-19: 20 [IU] via SUBCUTANEOUS
  Filled 2013-09-19 (×3): qty 0.2

## 2013-09-19 MED ORDER — ALUM & MAG HYDROXIDE-SIMETH 200-200-20 MG/5ML PO SUSP: 30.0000 mL | Freq: Four times a day (QID) | ORAL | Status: DC | PRN

## 2013-09-19 MED ORDER — TICAGRELOR 90 MG PO TABS
90.0000 mg | ORAL_TABLET | Freq: Two times a day (BID) | ORAL | Status: DC
  Filled 2013-09-19 (×3): qty 1

## 2013-09-19 MED ORDER — METOPROLOL TARTRATE 50 MG PO TABS
75.0000 mg | ORAL_TABLET | Freq: Two times a day (BID) | ORAL | Status: DC
  Filled 2013-09-19 (×2): qty 1

## 2013-09-19 NOTE — Progress Notes (Signed)
Pt discharged to home per MD order. Pt received and reviewed all discharge instructions and medication information including follow-up appointments and prescription information. Pt verbalized understanding. Pt IV and telemetry box removed before discharge. Pt alert and oriented at discharge with no complaints of pain. Pt escorted to private vehicle via wheelchair by guest services volunteer. Lenna Sciara
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
## 2013-09-19 NOTE — ED Provider Notes (Signed)
I saw and evaluated the patient, reviewed the resident's note and I agree with the findings and plan.  EKG Interpretation    Date/Time:    Ventricular Rate:    PR Interval:    QRS Duration:   QT Interval:    QTC Calculation:   R Axis:     Text Interpretation:               Leota Jacobsen, MD 09/19/13 [phone]

## 2013-09-19 NOTE — Discharge Summary (Signed)
See progress notes Emauri Krygier  

## 2013-09-19 NOTE — Progress Notes (Signed)
Subjective: No further CP.  He did not have any reduction in CP after taking nitro and asa for 20 minutes.   Objective: Vital signs in last 24 hours: Temp:  [98 F (36.7 C)-98.9 F (37.2 C)] 98 F (36.7 C) (01/09 0500) Pulse Rate:  [76-82] 76 (01/09 0500) Resp:  [15-24] 18 (01/09 0500) BP: (91-130)/(61-83) 91/61 mmHg (01/09 0500) SpO2:  [93 %-97 %] 93 % (01/09 0500) Weight:  [234 lb 9 oz (106.397 kg)] 234 lb 9 oz (106.397 kg) (01/08 2256) Last BM Date: 09/18/13  Intake/Output from previous day:   Intake/Output this shift:    Medications Current Facility-Administered Medications  Medication Dose Route Frequency Provider Last Rate Last Dose  . acetaminophen (TYLENOL) tablet 650 mg  650 mg Oral Q4H PRN Cletus Gash, MD      . ALPRAZolam Duanne Moron) tablet 0.5 mg  0.5 mg Oral TID PRN Cletus Gash, MD      . alum & mag hydroxide-simeth (MAALOX/MYLANTA) 200-200-20 MG/5ML suspension 30 mL  30 mL Oral Q6H PRN Pixie Casino, MD      . aspirin chewable tablet 324 mg  324 mg Oral Once Babette Relic, MD      . aspirin EC tablet 81 mg  81 mg Oral Daily Cletus Gash, MD      . buPROPion [facility]) tablet 75 mg  75 mg Oral BID WC Cletus Gash, MD      . heparin injection 5,000 Units  5,000 Units Subcutaneous Q8H Cletus Gash, MD   5,000 Units at 09/19/13 [phone]  . insulin aspart (novoLOG) injection 0-15 Units  0-15 Units Subcutaneous TID WC Cletus Gash, MD      . insulin aspart (novoLOG) injection 0-5 Units  0-5 Units Subcutaneous QHS Cletus Gash, MD   3 Units at 09/19/13 0201  . insulin glargine (LANTUS) injection 20 Units  20 Units Subcutaneous BID Cletus Gash, MD   20 Units at 09/19/13 0201  . isosorbide mononitrate (IMDUR) 24 hr tablet 60 mg  60 mg Oral Daily Cletus Gash, MD      . lisinopril (PRINIVIL,ZESTRIL) tablet 40 mg  40 mg Oral QHS Pixie Casino, MD      . metoprolol tartrate (LOPRESSOR) tablet 75 mg  75 mg Oral BID Cletus Gash, MD       . nicotine (NICODERM CQ - dosed in mg/24 hours) patch 14 mg  14 mg Transdermal Daily Cletus Gash, MD      . nitroGLYCERIN (NITROSTAT) SL tablet 0.4 mg  0.4 mg Sublingual Q5 Min x 3 PRN Cletus Gash, MD      . omega-3 acid ethyl esters (LOVAZA) capsule 1 g  1 g Oral Daily Pixie Casino, MD      . ondansetron [facility]) injection 4 mg  4 mg Intravenous Q6H PRN Cletus Gash, MD      . simvastatin (ZOCOR) tablet 40 mg  40 mg Oral QHS Cletus Gash, MD      . Ticagrelor [facility]) tablet 90 mg  90 mg Oral BID Cletus Gash, MD        PE: General appearance: alert, cooperative and no distress Lungs: clear to auscultation bilaterally Heart: regular rate and rhythm, S1, S2 normal, no murmur, click, rub or gallop Extremities: No LEE Pulses: 2+ and symmetric 1+ PTs Skin: Warm and dry Neurologic: Grossly normal  Lab Results:   Recent Labs  09/18/13 2300  WBC 8.1  HGB 17.4*  17.7*  HCT 48.4  52.0  PLT 122*  BMET  Recent Labs  09/18/13 2300  NA 138  138  K 4.0  4.2  CL 99  102  CO2 24  GLUCOSE 297*  300*  BUN 13  13  CREATININE 0.86  1.00  CALCIUM 9.7   PT/INR  Recent Labs  09/18/13 2300  LABPROT 12.8  INR 0.98    Assessment/Plan   Principal Problem:   Chest pain Active Problems:   Type II IDDM   HTN (hypertension)   Tobacco abuse   Contrast media allergy   S/P stent of LCX 05/16/12,in-stent restenosis March 2014 POBA, ISR 09/01/13- overlapping DES placed   COPD (chronic obstructive pulmonary disease)  Plan:  Two troponins negative so far and another in process.  EKG: SR with inf Q waves, TWI in V4-6.  Similar to 12/24.    Mildly hypotensive this morning.  ASA, brilinta, Imdur 60, Lopressor 75 bid, Lovaza, Zocor.   Glucose improved this morning.  MD onpion to follow.  Possible DC today.   LOS: 1 day    HAGER, BRYAN 09/19/2013 7:49 AM  As above, patient seen and examined; presently pain free and no dyspnea. His CP yesterday was  different than pain with MI; ECG not changed; enzymes negative; he has been compliant with DAPT. Plan DC today (change lisinopril to 20 mg daily as BP borderline); FU with Dr Recardo Evangelist 2 weeks; outpatient echo to reassess LV function. > 30 min PA and physician time D2 Kirk Ruths

## 2013-09-19 NOTE — ED Notes (Signed)
Patient requested ice chips.

## 2013-09-19 NOTE — Discharge Instructions (Signed)
Chest Pain Observation °It is often hard to give a specific diagnosis for the cause of chest pain. Among other possibilities your symptoms might be caused by inadequate oxygen delivery to your heart (angina). Angina that is not treated or evaluated can lead to a heart attack (myocardial infarction) or death. °Blood tests, electrocardiograms, and X-rays may have been done to help determine a possible cause of your chest pain. After evaluation and observation, your health care provider has determined that it is unlikely your pain was caused by an unstable condition that requires hospitalization. However, a full evaluation of your pain may need to be completed, with additional diagnostic testing as directed. It is very important to keep your follow-up appointments. Not keeping your follow-up appointments could result in permanent heart damage, disability, or death. If there is any problem keeping your follow-up appointments, you must call your health care provider. °HOME CARE INSTRUCTIONS  °Due to the slight chance that your pain could be angina, it is important to follow your health care provider's treatment plan and also maintain a healthy lifestyle: °· Maintain or work toward achieving a healthy weight. °· Stay physically active and exercise regularly. °· Decrease your salt intake. °· Eat a balanced, healthy diet. Talk to a dietician to learn about heart healthy foods. °· Increase your fiber intake by including whole grains, vegetables, fruits, and nuts in your diet. °· Avoid situations that cause stress, anger, or depression. °· Take medicines as advised by your health care provider. Report any side effects to your health care provider. Do not stop medicines or adjust the dosages on your own. °· Quit smoking. Do not use nicotine patches or gum until you check with your health care provider. °· Keep your blood pressure, blood sugar, and cholesterol levels within normal limits. °· Limit alcohol intake to no more than  1 drink per day for women that are not pregnant and 2 drinks per day for men. °· Do not abuse drugs. °SEEK IMMEDIATE MEDICAL CARE IF: °You have severe chest pain or pressure which may include symptoms such as: °· You feel pain or pressure in you arms, neck, jaw, or back. °· You have severe back or abdominal pain, feel sick to your stomach (nauseous), or throw up (vomit). °· You are sweating profusely. °· You are having a fast or irregular heartbeat. °· You feel short of breath while at rest. °· You notice increasing shortness of breath during rest, sleep, or with activity. °· You have chest pain that does not get better after rest or after taking your usual medicine. °· You wake from sleep with chest pain. °· You are unable to sleep because you cannot breathe. °· You develop a frequent cough or you are coughing up blood. °· You feel dizzy, faint, or experience extreme fatigue. °· You develop severe weakness, dizziness, fainting, or chills. °Any of these symptoms may represent a serious problem that is an emergency. Do not wait to see if the symptoms will go away. Call your local emergency services (911 in the [place] Do not drive yourself to the hospital. °MAKE SURE YOU: °· Understand these instructions. °· Will watch your condition. °· Will get help right away if you are not doing well or get worse. °Document Released: 09/30/2010 Document Revised: 04/30/2013 Document Reviewed: 02/27/2013 °ExitCare® Patient Information ©2014 [employer]. ° °

## 2013-10-03 ENCOUNTER — Ambulatory Visit: Payer: Medicare Other | Admitting: Physician Assistant

## 2013-10-07 ENCOUNTER — Telehealth: Payer: Self-pay | Admitting: *Deleted

## 2013-10-07 ENCOUNTER — Other Ambulatory Visit: Payer: Self-pay | Admitting: *Deleted

## 2013-10-07 DIAGNOSIS — I2510 Atherosclerotic heart disease of native coronary artery without angina pectoris: Secondary | ICD-10-CM

## 2013-10-07 MED ORDER — METOPROLOL TARTRATE 25 MG PO TABS
75.0000 mg | ORAL_TABLET | Freq: Two times a day (BID) | ORAL | Status: DC
Start: 2013-10-07 — End: 2014-01-02

## 2013-10-07 MED ORDER — ISOSORBIDE MONONITRATE ER 60 MG PO TB24: 60.0000 mg | ORAL_TABLET | Freq: Every day | ORAL | Status: DC

## 2013-10-07 MED ORDER — QUINAPRIL HCL 40 MG PO TABS: 20.0000 mg | ORAL_TABLET | Freq: Every day | ORAL | Status: DC

## 2013-10-07 MED ORDER — SIMVASTATIN 40 MG PO TABS: 40.0000 mg | ORAL_TABLET | Freq: Every day | ORAL | Status: DC

## 2013-10-07 NOTE — Telephone Encounter (Signed)
Rx was sent to pharmacy electronically. 

## 2013-10-20 ENCOUNTER — Ambulatory Visit (INDEPENDENT_AMBULATORY_CARE_PROVIDER_SITE_OTHER): Payer: Medicare HMO | Admitting: Physician Assistant

## 2013-10-20 ENCOUNTER — Encounter: Payer: Self-pay | Admitting: Physician Assistant

## 2013-10-20 VITALS — BP 110/76 | HR 80 | Ht 72.0 in | Wt 238.8 lb

## 2013-10-20 DIAGNOSIS — R0600 Dyspnea, unspecified: Secondary | ICD-10-CM | POA: Insufficient documentation

## 2013-10-20 DIAGNOSIS — Z720 Tobacco use: Secondary | ICD-10-CM

## 2013-10-20 DIAGNOSIS — Z9889 Other specified postprocedural states: Secondary | ICD-10-CM

## 2013-10-20 DIAGNOSIS — I2510 Atherosclerotic heart disease of native coronary artery without angina pectoris: Secondary | ICD-10-CM

## 2013-10-20 DIAGNOSIS — R0989 Other specified symptoms and signs involving the circulatory and respiratory systems: Secondary | ICD-10-CM

## 2013-10-20 DIAGNOSIS — Z95820 Peripheral vascular angioplasty status with implants and grafts: Secondary | ICD-10-CM

## 2013-10-20 DIAGNOSIS — R0609 Other forms of dyspnea: Secondary | ICD-10-CM

## 2013-10-20 DIAGNOSIS — F17200 Nicotine dependence, unspecified, uncomplicated: Secondary | ICD-10-CM

## 2013-10-20 DIAGNOSIS — I10 Essential (primary) hypertension: Secondary | ICD-10-CM

## 2013-10-20 NOTE — Assessment & Plan Note (Signed)
Blood pressure currently well-controlled. No change therapy
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
## 2013-10-20 NOTE — Patient Instructions (Signed)
1.  Keep up the effort with stopping smoking. 2.  Follow up with Dr. Loletha Grayer in 3 months.

## 2013-10-20 NOTE — Assessment & Plan Note (Signed)
Patient's dyspneic episodes last just a few seconds. This is likely related to Brilinta as he has never had these before. I told the patient that these will likely resolve. He is not overly concerned.

## 2013-10-20 NOTE — Assessment & Plan Note (Signed)
Patient has set a quit date 11/09/2013. Encouraged him in this endeavor.

## 2013-10-20 NOTE — Assessment & Plan Note (Signed)
No current chest pain or angina symptoms.  We'll check 2-D echocardiogram to reassess LV function.

## 2013-11-04 ENCOUNTER — Ambulatory Visit (HOSPITAL_COMMUNITY): Payer: Medicare Other

## 2013-11-04 ENCOUNTER — Telehealth (HOSPITAL_COMMUNITY): Payer: Self-pay | Admitting: *Deleted

## 2013-11-13 ENCOUNTER — Ambulatory Visit (HOSPITAL_COMMUNITY)
Admission: RE | Admit: 2013-11-13 | Discharge: 2013-11-13 | Disposition: A | Discharge status: Home / Self Care | Payer: Medicare HMO | Source: Ambulatory Visit | Attending: Cardiology | Admitting: Cardiology

## 2013-11-13 DIAGNOSIS — I2510 Atherosclerotic heart disease of native coronary artery without angina pectoris: Secondary | ICD-10-CM | POA: Insufficient documentation

## 2013-11-13 DIAGNOSIS — I517 Cardiomegaly: Secondary | ICD-10-CM

## 2013-12-10 ENCOUNTER — Ambulatory Visit (INDEPENDENT_AMBULATORY_CARE_PROVIDER_SITE_OTHER): Payer: Medicare HMO | Admitting: Cardiovascular Disease

## 2013-12-10 ENCOUNTER — Encounter: Payer: Self-pay | Admitting: Cardiovascular Disease

## 2013-12-10 VITALS — BP 134/68 | HR 80 | Resp 16 | Ht 72.0 in | Wt 239.1 lb

## 2013-12-10 DIAGNOSIS — E782 Mixed hyperlipidemia: Secondary | ICD-10-CM

## 2013-12-10 MED ORDER — TICAGRELOR 90 MG PO TABS: 90.0000 mg | ORAL_TABLET | Freq: Two times a day (BID) | ORAL | Status: DC

## 2013-12-10 NOTE — Patient Instructions (Signed)
Continue with current medication  Your physician wants you to follow-up in 3 month Dr Sallyanne Kuster.  You will receive a reminder letter in the mail two months in advance. If you don't receive a letter, please call our office to schedule the follow-up appointment.

## 2013-12-10 NOTE — Progress Notes (Signed)
Patient ID: Robert [name], male   DOB: 1951/02/25, 63 y.o.   MRN: [id number]      Reason for office visit CAD  Robert [name] is here to followup for coronary disease. He had inferolateral ST segment elevation myocardial infarction roughly 3 months ago. As before his problems were related to his left circumflex coronary artery.   He had a myocardial infarction in 1995, but I'm not sure which vessel was involved. In 2004 he received a bare-metal stent to the left circumflex coronary artery. An overlapping drug-eluting stent was placed for restenosis at the proximal end of the bare-metal stent in 2006. Restenosis was again documented in 2007 when another drug-eluting stent was placed overlapping the distal end of the bare-metal stent. In 2008 she had restenosis in the left circumflex coronary artery in the midportion of the stented segment and underwent balloon angioplasty. He received a stent there againin September 2013, developed in-stent restenosis in 2014 when he underwent angioplasty and then again a stent restenosis on 09/01/2013. He was seen in the emergency room on 09/18/2013 but did not undergo angiographic evaluation. LVEF is around 50-55%.  He is allergic to Plavix and he is now receiving Brilinta. This causes intermittent brief episodes of dyspnea that he has learned to live with. He has not had any further angina. He has not managed to quit smoking. His wife has successfully quit smoking.   Glycemic control has deteriorated and his last hemoglobin A1c was 12.1%. His triglycerides are very high and he was started on gemfibrozil in addition to simvastatin. He is due to start treatment with  Invokana.  Has rheumatoid arthritis and is receiving both a biological agent (tocilizumab) and methotrexate (or placebo) as part of a clinical trial. He has liver function tests performed monthly by his methodologies, Robert [name].   Allergies  Allergen Reactions  . Ivp Dye [Iodinated Diagnostic Agents]  Hives and Itching  . Plavix [Clopidogrel Bisulfate] Rash    Current Outpatient Prescriptions  Medication Sig Dispense Refill  . acetaminophen (TYLENOL) 325 MG tablet Take 2 tablets (650 mg total) by mouth every 4 (four) hours as needed for headache or mild pain.      [name] ALPRAZolam (XANAX) 0.5 MG tablet Take 0.5 mg by mouth 3 (three) times daily as needed. For anxiety      . aspirin EC 81 MG EC tablet Take 1 tablet (81 mg total) by mouth daily.      [name] buPROPion (WELLBUTRIN) 75 MG tablet Take 1 tablet (75 mg total) by mouth 2 (two) times daily.  60 tablet  9  . fish oil-omega-3 fatty acids 1000 MG capsule Take 2 g by mouth daily.       . folic acid (FOLVITE) 1 MG tablet Take 5 mg by mouth daily.      [name] gemfibrozil (LOPID) 600 MG tablet 600 mg 2 (two) times daily before a meal.       . glipiZIDE (GLUCOTROL XL) 10 MG 24 hr tablet Take 10 mg by mouth 2 (two) times daily.      . insulin glargine (LANTUS) 100 UNIT/ML injection Inject 80 Units into the skin daily.       . isosorbide mononitrate (IMDUR) 60 MG 24 hr tablet Take 1 tablet (60 mg total) by mouth daily.  90 tablet  2  . levofloxacin (LEVAQUIN) 500 MG tablet       . metFORMIN (GLUCOPHAGE) 1000 MG tablet Take 1 tablet (1,000 mg total) by mouth 2 (two) times  daily with a meal.      . methotrexate (RHEUMATREX) 2.5 MG tablet Take 25 mg by mouth once a week. Caution:Chemotherapy. Protect from light. ON THURSDAYS      . metoprolol tartrate (LOPRESSOR) 25 MG tablet Take 3 tablets (75 mg total) by mouth 2 (two) times daily.  270 tablet  2  . nitroGLYCERIN (NITROSTAT) 0.4 MG SL tablet Place 1 tablet (0.4 mg total) under the tongue every 5 (five) minutes x 3 doses as needed for chest pain.  25 tablet  2  . quinapril (ACCUPRIL) 40 MG tablet Take 0.5 tablets (20 mg total) by mouth at bedtime.  45 tablet  2  . simvastatin (ZOCOR) 40 MG tablet Take 1 tablet (40 mg total) by mouth at bedtime.  90 tablet  2  . Ticagrelor (BRILINTA) 90 MG TABS tablet Take 1  tablet (90 mg total) by mouth 2 (two) times daily.  56 tablet  0  . Tocilizumab 162 MG/0.9ML SOSY Inject 162 mg into the skin once a week. On tuesdays.       No current facility-administered medications for this visit.    Past Medical History  Diagnosis Date  . Hypertension   . Coronary artery disease   . Dysrhythmia   . CHF (congestive heart failure)   . Unstable angina 12/22/2011  . PVD, chronic LLE 12/22/2011  . High cholesterol   . 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 Chronic bronchitis     "maybe once/yr; last time 04/2012" (05/16/2012)  . Shortness of breath 05/16/2012    "@ rest, lying down, w/exertion"  . Type II diabetes mellitus   . Rheumatoid arthritis(714.0)     "qwhere on my body"  . Abnormal nuclear stress test 05/17/2012  . S/P angioplasty with stent of LCX 05/16/12 with expedition DES 05/17/2012  . Tobacco abuse     Past Surgical History  Procedure Laterality Date  . Back surgery    . Cholecystectomy  ~1995  . Coronary angioplasty  11/14/2012  . Coronary angioplasty with stent placement  05/16/2012    "1; makes total ~ 4"  . Nm myocar perf wall motion  08/30/2009    No significant ischemia    Family History  Problem Relation Age of Onset  . Colon cancer Father     History   Social History  . Marital Status: Married    Spouse Name: N/A    Number of Children: N/A  . Years of Education: N/A   Occupational History  . tractor Recruitment consultant    Social History Main Topics  . Smoking status: Current Every Day Smoker -- 0.25 packs/day for 41 years    Types: Cigarettes  . Smokeless tobacco: Never Used  . Alcohol Use: No  . Drug Use: No  . Sexual Activity: Yes   Other Topics Concern  . Not on file   Social History Narrative  . No narrative on file    Review of systems: The patient specifically denies any chest pain at rest or with exertion, orthopnea, paroxysmal nocturnal dyspnea, syncope,  palpitations, focal neurological deficits, intermittent claudication, lower extremity edema, unexplained weight gain, cough, hemoptysis. He has frequent dyspnea and wheezing but unfortunately cannot afford his inhalers.  The patient also denies abdominal pain, nausea, vomiting, dysphagia, diarrhea, constipation, polyuria, polydipsia, dysuria, hematuria, frequency, urgency, abnormal bleeding or bruising, fever, chills, unexpected weight changes, mood swings, change in skin  or hair texture, change in voice quality, auditory or visual problems, allergic reactions or rashes, new musculoskeletal complaints other than usual "aches and pains".   PHYSICAL EXAM BP 134/68  Pulse 80  Resp 16  Ht 6' (1.829 m)  Wt 108.455 kg (239 lb 1.6 oz)  BMI 32.42 kg/m2  General: Alert, oriented x3, no distress; he smells heavily of tobacco Head: no evidence of trauma, PERRL, EOMI, no exophtalmos or lid lag, no myxedema, no xanthelasma; normal ears, nose and oropharynx Neck: normal jugular venous pulsations and no hepatojugular reflux; brisk carotid pulses without delay and no carotid bruits Chest: clear to auscultation, no signs of consolidation by percussion or palpation, normal fremitus, symmetrical and full respiratory excursions Cardiovascular: normal position and quality of the apical impulse, regular rhythm, normal first and second heart sounds, no murmurs, rubs or gallops Abdomen: Dear abdominal adiposity, no tenderness or distention, no masses by palpation, no abnormal pulsatility or arterial bruits, normal bowel sounds, no hepatosplenomegaly Extremities: no clubbing, cyanosis or edema; 2+ radial, ulnar and brachial pulses bilaterally; 2+ right femoral, posterior tibial and dorsalis pedis pulses; 2+ left femoral, posterior tibial and dorsalis pedis pulses; no subclavian or femoral bruits Neurological: grossly nonfocal   EKG:  Sinus rhythm, left atrial enlargement, no clear evidence of Q waves or acute ST  segment changes  Lipid Panel     Component Value Date/Time   CHOL 138 09/02/2013 0223   TRIG 322* 09/02/2013 0223   HDL 29* 09/02/2013 0223   CHOLHDL 4.8 09/02/2013 0223   VLDL 64* 09/02/2013 0223   LDLCALC 45 09/02/2013 0223    BMET    Component Value Date/Time   NA 140 09/19/2013 0701   K 3.8 09/19/2013 0701   CL 103 09/19/2013 0701   CO2 24 09/19/2013 0701   GLUCOSE 176* 09/19/2013 0701   BUN 13 09/19/2013 0701   CREATININE 0.99 09/19/2013 0701   CALCIUM 9.2 09/19/2013 0701   GFRNONAA 87* 09/19/2013 0701   GFRAA >90 09/19/2013 0701     ASSESSMENT AND PLAN  At this point Mr. Vercher is asymptomatic but I think it is only a matter of time to eat once again develops restenosis in the territory of the left circumflex coronary artery. Bypass surgery has been repeatedly discussed as an option for therapy but since he has single-vessel disease we have been hesitant to refer him to surgery.  His cholesterol levels are acceptable but he has to very important coronary risk factors are remain unresolved. He continues to smoke relatively heavily and he has worsening glycemic control. I think smoking cessation is the most immediate need and as at previous appointments were discussed is in a lot of detail. Fact that his spouse has quit smoking should help.  We helped him with samples of his antiplatelet agent and are trying to enroll him in a patient assistance program.  Patient Instructions  Continue with current medication  Your physician wants you to follow-up in 3 month Dr Sallyanne Kuster.  You will receive a reminder letter in the mail two months in advance. If you don't receive a letter, please call our office to schedule the follow-up appointment.      Orders Placed This Encounter  Procedures  . Lipid panel   Meds ordered this encounter  Medications  . levofloxacin (LEVAQUIN) 500 MG tablet    Sig:   . Ticagrelor (BRILINTA) 90 MG TABS tablet    Sig: Take 1 tablet (90 mg total) by mouth 2 (two)  times  daily.    Dispense:  56 tablet    Refill:  0    Lot [id number];EXP 11/2015    Order Specific Question:  Supervising Provider    Answer:  Lorretta Harp [[id number]]    Holli Humbles, MD, [place] [phone] office [phone] pager

## 2014-01-02 ENCOUNTER — Other Ambulatory Visit: Payer: Self-pay | Admitting: Cardiovascular Disease

## 2014-01-02 NOTE — Telephone Encounter (Signed)
Rx was sent to pharmacy electronically. 

## 2014-01-06 ENCOUNTER — Telehealth: Payer: Self-pay | Admitting: *Deleted

## 2014-01-06 NOTE — Telephone Encounter (Signed)
Authorization to fill both Simvastatin and Gemfibrozil together faxed to [facility].

## 2014-01-19 ENCOUNTER — Ambulatory Visit: Payer: Medicare Other | Admitting: Cardiovascular Disease

## 2014-03-30 ENCOUNTER — Telehealth: Payer: Self-pay | Admitting: Cardiovascular Disease

## 2014-03-31 NOTE — Telephone Encounter (Signed)
Closed encounter °

## 2014-04-22 ENCOUNTER — Ambulatory Visit: Payer: Commercial Managed Care - HMO | Admitting: Cardiovascular Disease

## 2014-05-25 ENCOUNTER — Ambulatory Visit (INDEPENDENT_AMBULATORY_CARE_PROVIDER_SITE_OTHER): Payer: Commercial Managed Care - HMO | Admitting: Cardiovascular Disease

## 2014-05-25 ENCOUNTER — Encounter: Payer: Self-pay | Admitting: Cardiovascular Disease

## 2014-05-25 VITALS — BP 138/82 | HR 83 | Resp 22 | Ht 73.0 in | Wt 230.7 lb

## 2014-05-25 DIAGNOSIS — F17200 Nicotine dependence, unspecified, uncomplicated: Secondary | ICD-10-CM

## 2014-05-25 DIAGNOSIS — R0789 Other chest pain: Secondary | ICD-10-CM

## 2014-05-25 DIAGNOSIS — I739 Peripheral vascular disease, unspecified: Secondary | ICD-10-CM

## 2014-05-25 DIAGNOSIS — I798 Other disorders of arteries, arterioles and capillaries in diseases classified elsewhere: Secondary | ICD-10-CM

## 2014-05-25 DIAGNOSIS — E785 Hyperlipidemia, unspecified: Secondary | ICD-10-CM

## 2014-05-25 DIAGNOSIS — Z95820 Peripheral vascular angioplasty status with implants and grafts: Secondary | ICD-10-CM

## 2014-05-25 DIAGNOSIS — Z720 Tobacco use: Secondary | ICD-10-CM

## 2014-05-25 DIAGNOSIS — E1151 Type 2 diabetes mellitus with diabetic peripheral angiopathy without gangrene: Secondary | ICD-10-CM

## 2014-05-25 DIAGNOSIS — E1159 Type 2 diabetes mellitus with other circulatory complications: Secondary | ICD-10-CM

## 2014-05-25 DIAGNOSIS — Z9889 Other specified postprocedural states: Secondary | ICD-10-CM

## 2014-05-25 MED ORDER — NITROGLYCERIN 0.4 MG SL SUBL: 0.4000 mg | SUBLINGUAL_TABLET | SUBLINGUAL | Status: DC | PRN

## 2014-05-25 MED ORDER — ISOSORBIDE MONONITRATE ER 60 MG PO TB24: 90.0000 mg | ORAL_TABLET | Freq: Every day | ORAL | Status: DC

## 2014-05-25 MED ORDER — TICAGRELOR 90 MG PO TABS: 90.0000 mg | ORAL_TABLET | Freq: Two times a day (BID) | ORAL | Status: DC

## 2014-05-25 NOTE — Patient Instructions (Addendum)
INCREASE Isosorbide to 90 mg a day ( 1 1/2 tablets ).  A new Rx has been sent to [facility] for the NTG.  Dr. Sallyanne Kuster recommends that you schedule a follow-up appointment in: 6 months.

## 2014-05-29 DIAGNOSIS — E1151 Type 2 diabetes mellitus with diabetic peripheral angiopathy without gangrene: Secondary | ICD-10-CM | POA: Insufficient documentation

## 2014-05-29 NOTE — Assessment & Plan Note (Signed)
Excellent LDL cholesterol, low HDL and high triglycerides as a reflection of poor glycemic control. He is already a combination statin plus fibrate. The solution is better glycemic control

## 2014-05-29 NOTE — Assessment & Plan Note (Signed)
Again advised him that ongoing tobacco use is a big part of his tendency restenosis, together with poorly controlled diabetes. As always he is shamefaced but unable to kick the habit.

## 2014-05-29 NOTE — Progress Notes (Signed)
Reason for office visit CAD  Robert [name] is now roughly 9 months status post inferolateral myocardial infarction secondary to recurrent recent not equation is in the left circumflex coronary artery. He has not had problems with angina during light activity but has classic exertional angina she walks and carries something heavy at the same time, for example taking out the garbage and returning to his front door, roughly 300 feet distance. This also associates exertional dyspnea. He continues to smoke and smells heavily of smoke today.  He had his first myocardial infarction in 1995, but I'm not sure which vessel was involved. In 2004 he received a bare-metal stent to the left circumflex coronary artery. An overlapping drug-eluting stent was placed for restenosis at the proximal end of the bare-metal stent in 2006. Restenosis was again documented in 2007 when another drug-eluting stent was placed overlapping the distal end of the bare-metal stent. In 2008 she had restenosis in the left circumflex coronary artery in the midportion of the stented segment and underwent balloon angioplasty. He received a stent there again in September 2013, developed in-stent restenosis in 2014 when he underwent angioplasty and then again a stent restenosis on 09/01/2013. He was seen in the emergency room on 09/18/2013 but did not undergo angiographic evaluation. LVEF is around 50-55%. He is allergic to Plavix and he is now receiving Brilinta. He has diabetes mellitus which has generally been poorly controlled and has marked hypertriglyceridemia as well as hypercholesterolemia. He has rheumatoid arthritis and has received a variety of agents for this, most recently being enrolled in a clinical trial of a new biological.  Allergies  Allergen Reactions  . Ivp Dye [Iodinated Diagnostic Agents] Hives and Itching  . Clopidogrel Bisulfate Rash  . Iodine Rash    Contrast Dye  . Plavix [Clopidogrel Bisulfate] Rash     Current Outpatient Prescriptions  Medication Sig Dispense Refill  . acetaminophen (TYLENOL) 325 MG tablet Take 2 tablets (650 mg total) by mouth every 4 (four) hours as needed for headache or mild pain.      [name] ALPRAZolam (XANAX) 0.5 MG tablet Take 0.5 mg by mouth 3 (three) times daily as needed. For anxiety      . aspirin EC 81 MG EC tablet Take 1 tablet (81 mg total) by mouth daily.      [name] buPROPion (WELLBUTRIN) 75 MG tablet Take 1 tablet (75 mg total) by mouth 2 (two) times daily.  60 tablet  9  . fish oil-omega-3 fatty acids 1000 MG capsule Take 2 g by mouth daily.       . folic acid (FOLVITE) 1 MG tablet Take 1 mg by mouth daily.       [name] gemfibrozil (LOPID) 600 MG tablet 600 mg 2 (two) times daily before a meal.       . glipiZIDE (GLUCOTROL XL) 10 MG 24 hr tablet Take 10 mg by mouth 2 (two) times daily.      . insulin glargine (LANTUS) 100 UNIT/ML injection Inject 80 Units into the skin daily.       . isosorbide mononitrate (IMDUR) 60 MG 24 hr tablet Take 1.5 tablets (90 mg total) by mouth daily.  135 tablet  3  . levofloxacin (LEVAQUIN) 500 MG tablet       . metFORMIN (GLUCOPHAGE) 1000 MG tablet Take 1 tablet (1,000 mg total) by mouth 2 (two) times daily with a meal.      . methotrexate (RHEUMATREX) 2.5 MG tablet Take 25 mg  by mouth once a week. Caution:Chemotherapy. Protect from light. ON THURSDAYS      . metoprolol tartrate (LOPRESSOR) 25 MG tablet TAKE 3 TABLETS TWICE DAILY  540 tablet  3  . nitroGLYCERIN (NITROSTAT) 0.4 MG SL tablet Place 1 tablet (0.4 mg total) under the tongue every 5 (five) minutes x 3 doses as needed for chest pain.  25 tablet  2  . quinapril (ACCUPRIL) 40 MG tablet Take 0.5 tablets (20 mg total) by mouth at bedtime.  45 tablet  2  . simvastatin (ZOCOR) 40 MG tablet Take 1 tablet (40 mg total) by mouth at bedtime.  90 tablet  2  . ticagrelor (BRILINTA) 90 MG TABS tablet Take 1 tablet (90 mg total) by mouth 2 (two) times daily.  32 tablet  0  . predniSONE  (STERAPRED UNI-PAK) 5 MG TABS tablet        No current facility-administered medications for this visit.    Past Medical History  Diagnosis Date  . Hypertension   . Coronary artery disease   . Dysrhythmia   . CHF (congestive heart failure)   . Unstable angina 12/22/2011  . PVD, chronic LLE 12/22/2011  . High cholesterol   . 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 Chronic bronchitis     "maybe once/yr; last time 04/2012" (05/16/2012)  . Shortness of breath 05/16/2012    "@ rest, lying down, w/exertion"  . Type II diabetes mellitus   . Rheumatoid arthritis(714.0)     "qwhere on my body"  . Abnormal nuclear stress test 05/17/2012  . S/P angioplasty with stent of LCX 05/16/12 with expedition DES 05/17/2012  . Tobacco abuse     Past Surgical History  Procedure Laterality Date  . Back surgery    . Cholecystectomy  ~1995  . Coronary angioplasty  11/14/2012  . Coronary angioplasty with stent placement  05/16/2012    "1; makes total ~ 4"  . Nm myocar perf wall motion  08/30/2009    No significant ischemia    Family History  Problem Relation Age of Onset  . Colon cancer Father     History   Social History  . Marital Status: Married    Spouse Name: N/A    Number of Children: N/A  . Years of Education: N/A   Occupational History  . tractor Recruitment consultant    Social History Main Topics  . Smoking status: Current Every Day Smoker -- 0.25 packs/day for 41 years    Types: Cigarettes  . Smokeless tobacco: Never Used  . Alcohol Use: No  . Drug Use: No  . Sexual Activity: Yes   Other Topics Concern  . Not on file   Social History Narrative  . No narrative on file    Review of systems: The patient specifically denies any chest pain at rest or with usual exertion, orthopnea, paroxysmal nocturnal dyspnea, syncope, palpitations, focal neurological deficits, intermittent claudication, lower extremity edema, unexplained  weight gain, cough, hemoptysis. He has frequent dyspnea and wheezing but unfortunately cannot afford his inhalers.  The patient also denies abdominal pain, nausea, vomiting, dysphagia, diarrhea, constipation, polyuria, polydipsia, dysuria, hematuria, frequency, urgency, abnormal bleeding or bruising, fever, chills, unexpected weight changes, mood swings, change in skin or hair texture, change in voice quality, auditory or visual problems, allergic reactions or rashes, new musculoskeletal complaints other than usual "aches and pains".   PHYSICAL EXAM BP 138/82  Pulse 83  Resp 22  Ht 6\' 1"  (1.854 m)  Wt 104.645 kg (230 lb 11.2 oz)  BMI 30.44 kg/m2 General: Alert, oriented x3, no distress; he smells heavily of tobacco  Head: no evidence of trauma, PERRL, EOMI, no exophtalmos or lid lag, no myxedema, no xanthelasma; normal ears, nose and oropharynx  Neck: normal jugular venous pulsations and no hepatojugular reflux; brisk carotid pulses without delay and no carotid bruits  Chest: clear to auscultation, no signs of consolidation by percussion or palpation, normal fremitus, symmetrical and full respiratory excursions  Cardiovascular: normal position and quality of the apical impulse, regular rhythm, normal first and second heart sounds, no murmurs, rubs or gallops  Abdomen: Dear abdominal adiposity, no tenderness or distention, no masses by palpation, no abnormal pulsatility or arterial bruits, normal bowel sounds, no hepatosplenomegaly  Extremities: no clubbing, cyanosis or edema; 2+ radial, ulnar and brachial pulses bilaterally; 2+ right femoral, posterior tibial and dorsalis pedis pulses; 2+ left femoral, posterior tibial and dorsalis pedis pulses; no subclavian or femoral bruits  Neurological: grossly nonfocal   EKG: Sinus rhythm, left atrial enlargement, questionable inferior Q waves, no acute ST segment changes  Lipid Panel     Component Value Date/Time   CHOL 138 09/02/2013 0223   TRIG  322* 09/02/2013 0223   HDL 29* 09/02/2013 0223   CHOLHDL 4.8 09/02/2013 0223   VLDL 64* 09/02/2013 0223   LDLCALC 45 09/02/2013 0223    BMET    Component Value Date/Time   NA 140 09/19/2013 0701   K 3.8 09/19/2013 0701   CL 103 09/19/2013 0701   CO2 24 09/19/2013 0701   GLUCOSE 176* 09/19/2013 0701   BUN 13 09/19/2013 0701   CREATININE 0.99 09/19/2013 0701   CALCIUM 9.2 09/19/2013 0701   GFRNONAA 87* 09/19/2013 0701   GFRAA >90 09/19/2013 0701     ASSESSMENT AND PLAN S/P stent of LCX 05/16/12,in-stent restenosis March 2014 POBA, ISR 09/01/13- overlapping DES placed 9 months after his last ST segment elevation infarction, Robert [name] remains in the window for possible in-stent restenosis. In fact, it is more likely that this will happen and not. He currently has CCS class II exertional angina. Advised that he should increase his isosorbide mononitrate to 90 mg every morning for better symptom control. Ultimately the only solution would be bypassing the left circumflex coronary artery. We have avoided this since he has essentially single vessel disease.  Tobacco abuse Again advised him that ongoing tobacco use is a big part of his tendency restenosis, together with poorly controlled diabetes. As always he is shamefaced but unable to kick the habit.  Dyslipidemia- LDL 45 09/01/13 Excellent LDL cholesterol, low HDL and high triglycerides as a reflection of poor glycemic control. He is already a combination statin plus fibrate. The solution is better glycemic control  PVD, chronic LLE    DM type 2 causing vascular disease, not at goal      Orders Placed This Encounter  Procedures  . EKG 12-Lead   Meds ordered this encounter  Medications  . predniSONE (STERAPRED UNI-PAK) 5 MG TABS tablet    Sig:   . isosorbide mononitrate (IMDUR) 60 MG 24 hr tablet    Sig: Take 1.5 tablets (90 mg total) by mouth daily.    Dispense:  135 tablet    Refill:  3  . DISCONTD: nitroGLYCERIN (NITROSTAT) 0.4 MG SL tablet     Sig: Place 1 tablet (0.4 mg total) under the tongue every 5 (five) minutes  x 3 doses as needed for chest pain.    Dispense:  25 tablet    Refill:  2  . ticagrelor (BRILINTA) 90 MG TABS tablet    Sig: Take 1 tablet (90 mg total) by mouth 2 (two) times daily.    Dispense:  32 tablet    Refill:  0    Lot [id number];EXP 09/2016  . nitroGLYCERIN (NITROSTAT) 0.4 MG SL tablet    Sig: Place 1 tablet (0.4 mg total) under the tongue every 5 (five) minutes x 3 doses as needed for chest pain.    Dispense:  25 tablet    Refill:  2    Robert [name]  Sanda Klein, MD, [facility] [phone] office [phone] pager

## 2014-05-29 NOTE — Assessment & Plan Note (Signed)
9 months after his last ST segment elevation infarction, [place] remains in the window for possible in-stent restenosis. In fact, it is more likely that this will happen and not. He currently has CCS class II exertional angina. Advised that he should increase his isosorbide mononitrate to 90 mg every morning for better symptom control. Ultimately the only solution would be bypassing the left circumflex coronary artery. We have avoided this since he has essentially single vessel disease.

## 2014-08-20 ENCOUNTER — Encounter (HOSPITAL_COMMUNITY): Payer: Self-pay | Admitting: Cardiovascular Disease

## 2014-09-11 ENCOUNTER — Other Ambulatory Visit: Payer: Self-pay | Admitting: Cardiovascular Disease

## 2014-09-14 NOTE — Telephone Encounter (Signed)
Rx(s) sent to pharmacy electronically.  

## 2014-09-15 ENCOUNTER — Telehealth: Payer: Self-pay | Admitting: Cardiovascular Disease

## 2014-09-15 MED ORDER — QUINAPRIL HCL 40 MG PO TABS: 20.0000 mg | ORAL_TABLET | Freq: Every day | ORAL | Status: DC

## 2014-09-15 NOTE — Telephone Encounter (Signed)
Refill submitted to patient's preferred pharmacy. Informed patient. Pt voiced understanding, no other stated concerns at this time.  

## 2014-09-15 NOTE — Telephone Encounter (Signed)
Pt called in stating that a new prescription for his quinapril needs to be sent to [facility] for a refill. Please call  Thanks

## 2014-10-19 DIAGNOSIS — Z6829 Body mass index (BMI) 29.0-29.9, adult: Secondary | ICD-10-CM | POA: Diagnosis not present

## 2014-10-19 DIAGNOSIS — E1165 Type 2 diabetes mellitus with hyperglycemia: Secondary | ICD-10-CM | POA: Diagnosis not present

## 2014-10-19 DIAGNOSIS — F419 Anxiety disorder, unspecified: Secondary | ICD-10-CM | POA: Diagnosis not present

## 2014-10-19 DIAGNOSIS — I2510 Atherosclerotic heart disease of native coronary artery without angina pectoris: Secondary | ICD-10-CM | POA: Diagnosis not present

## 2014-11-23 ENCOUNTER — Ambulatory Visit: Payer: Commercial Managed Care - HMO | Admitting: Cardiovascular Disease

## 2014-12-08 ENCOUNTER — Other Ambulatory Visit: Payer: Self-pay | Admitting: Cardiovascular Disease

## 2014-12-09 NOTE — Telephone Encounter (Signed)
Rx(s) sent to pharmacy electronically.  

## 2014-12-31 DIAGNOSIS — I2510 Atherosclerotic heart disease of native coronary artery without angina pectoris: Secondary | ICD-10-CM | POA: Diagnosis not present

## 2014-12-31 DIAGNOSIS — R5383 Other fatigue: Secondary | ICD-10-CM | POA: Diagnosis not present

## 2014-12-31 DIAGNOSIS — E6609 Other obesity due to excess calories: Secondary | ICD-10-CM | POA: Diagnosis not present

## 2014-12-31 DIAGNOSIS — Z6830 Body mass index (BMI) 30.0-30.9, adult: Secondary | ICD-10-CM | POA: Diagnosis not present

## 2014-12-31 DIAGNOSIS — F17200 Nicotine dependence, unspecified, uncomplicated: Secondary | ICD-10-CM | POA: Diagnosis not present

## 2014-12-31 DIAGNOSIS — M069 Rheumatoid arthritis, unspecified: Secondary | ICD-10-CM | POA: Diagnosis not present

## 2014-12-31 DIAGNOSIS — I10 Essential (primary) hypertension: Secondary | ICD-10-CM | POA: Diagnosis not present

## 2015-01-07 ENCOUNTER — Encounter: Payer: Self-pay | Admitting: Cardiovascular Disease

## 2015-01-07 ENCOUNTER — Ambulatory Visit
Admission: RE | Admit: 2015-01-07 | Discharge: 2015-01-07 | Disposition: A | Discharge status: Home / Self Care | Payer: Commercial Managed Care - HMO | Source: Ambulatory Visit | Attending: Cardiovascular Disease | Admitting: Cardiovascular Disease

## 2015-01-07 ENCOUNTER — Ambulatory Visit (INDEPENDENT_AMBULATORY_CARE_PROVIDER_SITE_OTHER): Payer: Commercial Managed Care - HMO | Admitting: Cardiovascular Disease

## 2015-01-07 VITALS — BP 124/90 | HR 90 | Resp 22 | Ht 73.0 in | Wt 225.0 lb

## 2015-01-07 DIAGNOSIS — I10 Essential (primary) hypertension: Secondary | ICD-10-CM | POA: Diagnosis not present

## 2015-01-07 DIAGNOSIS — R0602 Shortness of breath: Secondary | ICD-10-CM

## 2015-01-07 DIAGNOSIS — J9811 Atelectasis: Secondary | ICD-10-CM | POA: Diagnosis not present

## 2015-01-07 DIAGNOSIS — Z9889 Other specified postprocedural states: Secondary | ICD-10-CM

## 2015-01-07 DIAGNOSIS — I739 Peripheral vascular disease, unspecified: Secondary | ICD-10-CM | POA: Diagnosis not present

## 2015-01-07 DIAGNOSIS — J449 Chronic obstructive pulmonary disease, unspecified: Secondary | ICD-10-CM | POA: Diagnosis not present

## 2015-01-07 DIAGNOSIS — Z95820 Peripheral vascular angioplasty status with implants and grafts: Secondary | ICD-10-CM

## 2015-01-07 DIAGNOSIS — I25118 Atherosclerotic heart disease of native coronary artery with other forms of angina pectoris: Secondary | ICD-10-CM

## 2015-01-07 DIAGNOSIS — Z720 Tobacco use: Secondary | ICD-10-CM

## 2015-01-07 MED ORDER — TICAGRELOR 90 MG PO TABS: 90.0000 mg | ORAL_TABLET | Freq: Two times a day (BID) | ORAL | Status: DC

## 2015-01-07 NOTE — Patient Instructions (Signed)
A chest x-ray takes a picture of the organs and structures inside the chest, including the heart, lungs, and blood vessels. This test can show several things, including, whether the heart is enlarges; whether fluid is building up in the lungs; and whether pacemaker / defibrillator leads are still in place.  [facility] [place]. [address]  1st floor.  Your physician has requested that you have an echocardiogram. Echocardiography is a painless test that uses sound waves to create images of your heart. It provides your doctor with information about the size and shape of your heart and how well your heart's chambers and valves are working. This procedure takes approximately one hour. There are no restrictions for this procedure.  Dr. Sallyanne Kuster recommends that you schedule a follow-up appointment in: 1st available.

## 2015-01-09 ENCOUNTER — Encounter: Payer: Self-pay | Admitting: Cardiovascular Disease

## 2015-01-09 DIAGNOSIS — I2510 Atherosclerotic heart disease of native coronary artery without angina pectoris: Secondary | ICD-10-CM | POA: Insufficient documentation

## 2015-01-09 NOTE — Progress Notes (Signed)
Patient ID: Robert [name], male   DOB: 09/20/1950, 64 y.o.   MRN: [id number]     Cardiology Office Note   Date:  01/09/2015   ID:  YUSIF GNAU, DOB 20-May-1951, MRN [id number]  PCP:  Glo Herring., MD  Cardiologist:   Sanda Klein, MD   chief complaint: Worsening exertional dyspnea   History of Present Illness: Robert [name] is a 64 y.o. male who presents for follow-up for coronary artery disease and reports worsening exertional dyspnea. He becomes short of breath taking a shower or putting his clothes on. This has been going on for roughly 2-1/2 months and seems to predate initiation of Enbrel therapy for rheumatoid arthritis. However he did seem to worsen since Enbrel was initiated. It is not helping his joint complaints. She had been previously enrolled in a controlled trial of a different biological agent for rheumatoid arthritis which provided excellent relief of symptoms. This agent is too expensive for him.  He is also grieving following the sudden death of his daughter who died of what sounds like a massive pulmonary embolism.  He had lab tests performed at [place] last week but I don't yet have those results.  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 He usually presents with exertional angina rather than dyspnea. His last procedure was performed in December 2014 and this is actually a record for Robert [name], who usually presents for repeat percutaneous intervention every 6-8 months.  - He had his first myocardial infarction in 1995, but I'm not sure which vessel was involved (suspect right coronary artery which is totally occluded) - 2004 showed total occlusion of RCA and intermediate lesions LAD, high grade mid LCX treated with 4x18 mm BMS (Velocity).  - 2006 overlapping 3.5x23 mm Cypher inLCX  - 2007 new downstream stenosis at OM bifurcation treated with 3x18 mm  Cypher stent  - 2008 acute stent thrombosis (allergic to plavix, temporarily stopped Ticlid) - thrombolysis/aspiration only  - April 2013 cutting balloon angioplasty for proximal LCX in stent restenosis (Dr. Claiborne Billings)  - September 2013 repeat restenosis treated with "stent in stent" Xience Expedition 3.25 x 23 mm (Dr. Gwenlyn Found).  - March 2014 left circumflex coronary artery PCI for restenosis, utilizing cutting balloon atherotomy/noncompliant balloon dilatation  - 09/01/2013 presents with ST segment elevation myocardial infarction and received another drug-eluting stent to the left circumflex artery for in-stent restenosis: Promus Premier DES stent 3.5 x 24 mm - No change in RCA or LAD anatomy during last several years.  -Allergic to iodinated contrast, but did well with premedication. Allergic to clopidogrel but tolerates Effient well.   LVEF is around 50-55%. He is allergic to Plavix and he is now receiving Brilinta. He has diabetes mellitus which has generally been poorly controlled and has marked hypertriglyceridemia as well as hypercholesterolemia.   He denies palpitations, lower extremity edema, worsening intermittent claudication, orthopnea or paroxysmal nocturnal dyspnea. He has a daily cough productive of scanty amounts of clear sputum. He continues to smoke a pack of cigarettes every day. His wife also still smokes.    Past Medical History  Diagnosis Date  . Hypertension   . Coronary artery disease   . Dysrhythmia   . CHF (congestive heart failure)   . Unstable angina 12/22/2011  . PVD, chronic LLE 12/22/2011  . High cholesterol   . 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 Chronic bronchitis     "maybe once/yr; last time 04/2012" (05/16/2012)  . Shortness of breath 05/16/2012    "@ rest, lying down, w/exertion"  . Type II diabetes mellitus   . Rheumatoid arthritis(714.0)     "qwhere on my body"  . Abnormal nuclear stress  test 05/17/2012  . S/P angioplasty with stent of LCX 05/16/12 with expedition DES 05/17/2012  . Tobacco abuse     Past Surgical History  Procedure Laterality Date  . Back surgery    . Cholecystectomy  ~1995  . Coronary angioplasty  11/14/2012  . Coronary angioplasty with stent placement  05/16/2012    "1; makes total ~ 4"  . Nm myocar perf wall motion  08/30/2009    No significant ischemia  . Left heart catheterization with coronary angiogram N/A 12/22/2011    Procedure: LEFT HEART CATHETERIZATION WITH CORONARY ANGIOGRAM;  Surgeon: Troy Sine, MD;  Location: [facility] CATH LAB;  Service: Cardiovascular;  Laterality: N/A;  . Percutaneous coronary intervention-balloon only  12/22/2011    Procedure: PERCUTANEOUS CORONARY INTERVENTION-BALLOON ONLY;  Surgeon: Troy Sine, MD;  Location: [facility] CATH LAB;  Service: Cardiovascular;;  . Left heart catheterization with coronary angiogram Bilateral 05/16/2012    Procedure: LEFT HEART CATHETERIZATION WITH CORONARY ANGIOGRAM;  Surgeon: Lorretta Harp, MD;  Location: [facility] CATH LAB;  Service: Cardiovascular;  Laterality: Bilateral;  . Percutaneous coronary stent intervention (pci-s)  05/16/2012    Procedure: PERCUTANEOUS CORONARY STENT INTERVENTION (PCI-S);  Surgeon: Lorretta Harp, MD;  Location: [facility] CATH LAB;  Service: Cardiovascular;;  . Left heart catheterization with coronary angiogram N/A 11/14/2012    Procedure: LEFT HEART CATHETERIZATION WITH CORONARY ANGIOGRAM;  Surgeon: Troy Sine, MD;  Location: [facility] CATH LAB;  Service: Cardiovascular;  Laterality: N/A;  . Percutaneous coronary intervention-balloon only  11/14/2012    Procedure: PERCUTANEOUS CORONARY INTERVENTION-BALLOON ONLY;  Surgeon: Troy Sine, MD;  Location: [facility] CATH LAB;  Service: Cardiovascular;;  . Left heart catheterization with coronary angiogram N/A 09/01/2013    Procedure: LEFT HEART CATHETERIZATION WITH CORONARY ANGIOGRAM;  Surgeon: Leonie Man, MD;  Location: [facility] CATH LAB;  Service:  Cardiovascular;  Laterality: N/A;  . Percutaneous coronary stent intervention (pci-s)  09/01/2013    Procedure: PERCUTANEOUS CORONARY STENT INTERVENTION (PCI-S);  Surgeon: Leonie Man, MD;  Location: [facility] CATH LAB;  Service: Cardiovascular;;     Current Outpatient Prescriptions  Medication Sig Dispense Refill  . acetaminophen (TYLENOL) 325 MG tablet Take 2 tablets (650 mg total) by mouth every 4 (four) hours as needed for headache or mild pain.    [name] ALPRAZolam (XANAX) 0.5 MG tablet Take 0.5 mg by mouth 3 (three) times daily as needed. For anxiety    . aspirin EC 81 MG EC tablet Take 1 tablet (81 mg total) by mouth daily.    . fish oil-omega-3 fatty acids 1000 MG capsule Take 2 g by mouth daily.     . folic acid (FOLVITE) 1 MG tablet Take 1 mg by mouth daily.     [name] gemfibrozil (LOPID) 600 MG tablet 600 mg 2 (two) times daily before a meal.     . glipiZIDE (GLUCOTROL XL) 10 MG 24 hr tablet Take 10 mg by mouth 2 (two) times daily.    . insulin glargine (LANTUS) 100 UNIT/ML injection Inject 80 Units into the skin daily.     . isosorbide mononitrate (IMDUR) 60 MG 24 hr tablet Take 1.5 tablets (90  mg total) by mouth daily. 135 tablet 3  . metFORMIN (GLUCOPHAGE) 1000 MG tablet Take 1 tablet (1,000 mg total) by mouth 2 (two) times daily with a meal.    . methotrexate (RHEUMATREX) 2.5 MG tablet Take 25 mg by mouth once a week. Caution:Chemotherapy. Protect from light. ON THURSDAYS    . metoprolol tartrate (LOPRESSOR) 25 MG tablet TAKE 3 TABLETS TWICE DAILY 540 tablet 3  . nitroGLYCERIN (NITROSTAT) 0.4 MG SL tablet Place 1 tablet (0.4 mg total) under the tongue every 5 (five) minutes x 3 doses as needed for chest pain. 25 tablet 2  . quinapril (ACCUPRIL) 40 MG tablet Take 0.5 tablets (20 mg total) by mouth at bedtime. 45 tablet 2  . simvastatin (ZOCOR) 40 MG tablet Take 1 tablet (40 mg total) by mouth at bedtime. 90 tablet 2  . ticagrelor (BRILINTA) 90 MG TABS tablet Take 1 tablet (90 mg total) by  mouth 2 (two) times daily. 48 tablet 0   No current facility-administered medications for this visit.    Allergies:   Ivp dye; Clopidogrel bisulfate; Iodine; and Plavix    Social History:  The patient  reports that he has been smoking Cigarettes.  He has a 10.25 pack-year smoking history. He has never used smokeless tobacco. He reports that he does not drink alcohol or use illicit drugs.   Family History:  The patient's family history includes Cancer in his mother; Colon cancer in his father; Heart disease in his mother; Hypertension in his mother.    ROS:  Please see the history of present illness.    Otherwise, review of systems positive for none.   All other systems are reviewed and negative.    PHYSICAL EXAM: VS:  BP 124/90 mmHg  Pulse 90  Resp 22  Ht 6\' 1"  (1.854 m)  Wt 225 lb (102.059 kg)  BMI 29.69 kg/m2 , BMI Body mass index is 29.69 kg/(m^2).  General: Alert, oriented x3, no distress; he smells heavily of tobacco  Head: no evidence of trauma, PERRL, EOMI, no exophtalmos or lid lag, no myxedema, no xanthelasma; normal ears, nose and oropharynx  Neck: normal jugular venous pulsations and no hepatojugular reflux; brisk carotid pulses without delay and no carotid bruits  Chest: clear to auscultation, no signs of consolidation by percussion or palpation, normal fremitus, symmetrical and full respiratory excursions  Cardiovascular: normal position and quality of the apical impulse, regular rhythm, normal first and second heart sounds, no murmurs, rubs or gallops  Abdomen: Dear abdominal adiposity, no tenderness or distention, no masses by palpation, no abnormal pulsatility or arterial bruits, normal bowel sounds, no hepatosplenomegaly  Extremities: no clubbing, cyanosis or edema; 2+ radial, ulnar and brachial pulses bilaterally; 2+ right femoral, posterior tibial and dorsalis pedis pulses; 2+ left femoral, posterior tibial and dorsalis pedis pulses; no subclavian or femoral  bruits  Neurological: grossly nonfocal  Psych: euthymic mood, full affect   EKG:  EKG is ordered today. The ekg ordered today demonstrates sinus rhythm, Q waves in leads 3, F and a tall R wave in lead V2 consistent with old inferoposterior infarction, no repolarization abnormalities    Lipid Panel    Component Value Date/Time   CHOL 138 09/02/2013 0223   TRIG 322* 09/02/2013 0223   HDL 29* 09/02/2013 0223   CHOLHDL 4.8 09/02/2013 0223   VLDL 64* 09/02/2013 0223   LDLCALC 45 09/02/2013 0223      Wt Readings from Last 3 Encounters:  01/07/15 225 lb (102.059 kg)  05/25/14 230 lb 11.2 oz (104.645 kg)  12/10/13 239 lb 1.6 oz (108.455 kg)     ASSESSMENT AND PLAN:  1. S/P stent of LCX 05/16/12,in-stent restenosis March 2014 POBA, ISR 09/01/13- overlapping DES placed 16 months after his last ST segment elevation infarction, [place] remains in the window for possible in-stent restenosis.  He currently has social dyspnea rather than exertional angina. It is possible that he has developed congestive heart failure following the inferoposterior myocardial infarction in December 2014. The infarction location with definitely predispose to development of ischemic mitral regurgitation, but I do not appreciate this on physical exam. Repeat echo for assessment of LVEF and mitral valve function. Will follow up in clinic after his echo The absence of angina also raises the possibility that his dyspnea is not cardiac in etiology. He is a lifelong heavy smoker and certainly has emphysema and chronic bronchitis. Have also recommended that he have a chest x-ray.  Tobacco abuse Again advised him that ongoing tobacco use is a big part of his tendency restenosis, together with poorly controlled diabetes. As always he is shamefaced but unable to kick the habit. His recent grief surrounding the loss of his daughter makes it hard to push the subject today.  Dyslipidemia- LDL 45 09/01/13 Excellent LDL  cholesterol, low HDL and high triglycerides as a reflection of poor glycemic control. He is already a combination statin plus fibrate. The solution is better glycemic control  PVD, chronic LLE Not currently symptomatic, although his breathing limits activity and may be covering up intermittent claudication.  DM type 2 causing vascular disease, not at goal   Current medicines are reviewed at length with the patient today.  The patient does not have concerns regarding medicines.  The following changes have been made:  no change  Labs/ tests ordered today include:  Orders Placed This Encounter  Procedures  . DG Chest 2 View  . EKG 12-Lead  . Echocardiogram    Patient Instructions  A chest x-ray takes a picture of the organs and structures inside the chest, including the heart, lungs, and blood vessels. This test can show several things, including, whether the heart is enlarges; whether fluid is building up in the lungs; and whether pacemaker / defibrillator leads are still in place.  [facility] [place]. [address]  1st floor.  Your physician has requested that you have an echocardiogram. Echocardiography is a painless test that uses sound waves to create images of your heart. It provides your doctor with information about the size and shape of your heart and how well your heart's chambers and valves are working. This procedure takes approximately one hour. There are no restrictions for this procedure.  Dr. Sallyanne Kuster recommends that you schedule a follow-up appointment in: 1st available.        Mikael Spray, MD  01/09/2015 4:26 PM    Sanda Klein, MD, [facility] [phone] office [phone] pager

## 2015-01-11 ENCOUNTER — Ambulatory Visit (HOSPITAL_COMMUNITY)
Admission: RE | Admit: 2015-01-11 | Discharge: 2015-01-11 | Disposition: A | Discharge status: Home / Self Care | Payer: Commercial Managed Care - HMO | Source: Ambulatory Visit | Attending: Cardiovascular Disease | Admitting: Cardiovascular Disease

## 2015-01-11 DIAGNOSIS — R0602 Shortness of breath: Secondary | ICD-10-CM

## 2015-01-11 DIAGNOSIS — R0600 Dyspnea, unspecified: Secondary | ICD-10-CM | POA: Diagnosis not present

## 2015-01-11 DIAGNOSIS — I10 Essential (primary) hypertension: Secondary | ICD-10-CM | POA: Diagnosis not present

## 2015-01-12 ENCOUNTER — Telehealth: Payer: Self-pay | Admitting: *Deleted

## 2015-01-12 DIAGNOSIS — R0600 Dyspnea, unspecified: Secondary | ICD-10-CM

## 2015-01-12 NOTE — Telephone Encounter (Signed)
Patient notified of echo and CXR results.  Informed Dr. Loletha Grayer wants him to have a Lexiscan Myoview.  Patient agreed.  Will schedule and have scheduling call patient with appointment.  Patient voiced understanding.

## 2015-01-12 NOTE — Telephone Encounter (Signed)
-----   Message from Sanda Klein, MD sent at 01/11/2015  4:16 PM EDT ----- Normal heart pumping function, no signs of CHF. Please schedule for [facility]

## 2015-01-13 DIAGNOSIS — M2550 Pain in unspecified joint: Secondary | ICD-10-CM | POA: Diagnosis not present

## 2015-01-13 DIAGNOSIS — M0589 Other rheumatoid arthritis with rheumatoid factor of multiple sites: Secondary | ICD-10-CM | POA: Diagnosis not present

## 2015-01-21 ENCOUNTER — Telehealth (HOSPITAL_COMMUNITY): Payer: Self-pay

## 2015-01-21 NOTE — Telephone Encounter (Signed)
Encounter complete. 

## 2015-01-26 ENCOUNTER — Ambulatory Visit (HOSPITAL_COMMUNITY)
Admission: RE | Admit: 2015-01-26 | Discharge: 2015-01-26 | Disposition: A | Discharge status: Home / Self Care | Payer: Commercial Managed Care - HMO | Source: Ambulatory Visit | Attending: Cardiovascular Disease | Admitting: Cardiovascular Disease

## 2015-01-26 ENCOUNTER — Telehealth: Payer: Self-pay | Admitting: *Deleted

## 2015-01-26 ENCOUNTER — Encounter: Payer: Self-pay | Admitting: Cardiology

## 2015-01-26 DIAGNOSIS — R5383 Other fatigue: Secondary | ICD-10-CM

## 2015-01-26 DIAGNOSIS — Z79899 Other long term (current) drug therapy: Secondary | ICD-10-CM

## 2015-01-26 DIAGNOSIS — R0600 Dyspnea, unspecified: Secondary | ICD-10-CM | POA: Diagnosis not present

## 2015-01-26 DIAGNOSIS — R9439 Abnormal result of other cardiovascular function study: Secondary | ICD-10-CM

## 2015-01-26 DIAGNOSIS — Z7901 Long term (current) use of anticoagulants: Secondary | ICD-10-CM

## 2015-01-26 LAB — MYOCARDIAL PERFUSION IMAGING
CHL CUP NUCLEAR SDS: 6
CHL CUP NUCLEAR SSS: 15
CHL CUP STRESS STAGE 1 DBP: 75 mmHg
CHL CUP STRESS STAGE 3 GRADE: 0 %
CHL CUP STRESS STAGE 3 HR: 89 {beats}/min
CHL CUP STRESS STAGE 3 SBP: 127 mmHg
CHL CUP STRESS STAGE 3 SPEED: 0 mph
CHL CUP STRESS STAGE 4 SBP: 111 mmHg
CSEPPBP: 127 mmHg
CSEPPHR: 89 {beats}/min
CSEPPMHR: 55 %
Estimated workload: 1 METS
LV dias vol: 96 mL
LV sys vol: 51 mL
Nuc Stress EF: 47 %
Rest HR: 83 {beats}/min
SRS: 12
Stage 1 Grade: 0 %
Stage 1 HR: 83 {beats}/min
Stage 1 SBP: 109 mmHg
Stage 1 Speed: 0 mph
Stage 2 Grade: 0 %
Stage 2 HR: 83 {beats}/min
Stage 2 Speed: 0 mph
Stage 3 DBP: 84 mmHg
Stage 4 DBP: 68 mmHg
Stage 4 Grade: 0 %
Stage 4 HR: 94 {beats}/min
Stage 4 Speed: 0 mph
TID: 0.98

## 2015-01-26 MED ORDER — TECHNETIUM TC 99M SESTAMIBI GENERIC - CARDIOLITE
30.2000 | Freq: Once | INTRAVENOUS | Status: AC | PRN
  Administered 2015-01-26: 30.2 via INTRAVENOUS

## 2015-01-26 MED ORDER — REGADENOSON 0.4 MG/5ML IV SOLN
0.4000 mg | Freq: Once | INTRAVENOUS | Status: AC
  Administered 2015-01-26: 0.4 mg via INTRAVENOUS

## 2015-01-26 MED ORDER — TECHNETIUM TC 99M SESTAMIBI GENERIC - CARDIOLITE
10.4000 | Freq: Once | INTRAVENOUS | Status: AC | PRN
  Administered 2015-01-26: 10 via INTRAVENOUS

## 2015-01-26 NOTE — Telephone Encounter (Signed)
Patient notified of Nuclear Stress test results.  Informed he needs a cardiac cath with possible PCI by Dr. Ellyn Hack.  Patient voiced understanding and agrees with plan.  Scheduled for Monday 02/01/15 and will have labs done tomorrow in [place].

## 2015-01-26 NOTE — Telephone Encounter (Signed)
-----   Message from Sanda Klein, MD sent at 01/26/2015  1:37 PM EDT ----- Looks like recurrent problem in same coronary territory. Would like to set him up for cath and probable PCI with Interventional Cardiologist. If OK, would request Dr. Glenetta Hew, who did his most recent PCI about 18 months ago.

## 2015-01-27 ENCOUNTER — Telehealth: Payer: Self-pay | Admitting: Cardiovascular Disease

## 2015-01-27 ENCOUNTER — Other Ambulatory Visit: Payer: Self-pay | Admitting: Cardiovascular Disease

## 2015-01-27 DIAGNOSIS — Z7901 Long term (current) use of anticoagulants: Secondary | ICD-10-CM | POA: Diagnosis not present

## 2015-01-27 DIAGNOSIS — R5383 Other fatigue: Secondary | ICD-10-CM | POA: Diagnosis not present

## 2015-01-27 DIAGNOSIS — Z79899 Other long term (current) drug therapy: Secondary | ICD-10-CM | POA: Diagnosis not present

## 2015-01-27 NOTE — Telephone Encounter (Signed)
Jeani Hawking is calling to get some lab orders on the pt who is currently in the office   Thanks

## 2015-01-27 NOTE — Telephone Encounter (Signed)
[facility] requesting pt labwork - this was ordered for [employer]. Orders were printed & faxed to number provided by caller.

## 2015-01-28 LAB — COMPREHENSIVE METABOLIC PANEL
ALT: 14 IU/L (ref 0–44)
AST: 12 IU/L (ref 0–40)
Albumin/Globulin Ratio: 1.5 (ref 1.1–2.5)
Albumin: 4.4 g/dL (ref 3.6–4.8)
Alkaline Phosphatase: 161 IU/L — ABNORMAL HIGH (ref 39–117)
BUN/Creatinine Ratio: 23 — ABNORMAL HIGH (ref 10–22)
BUN: 21 mg/dL (ref 8–27)
Bilirubin Total: <0.2 mg/dL (ref 0.0–1.2)
CALCIUM: 10.3 mg/dL — AB (ref 8.6–10.2)
CO2: 16 mmol/L — ABNORMAL LOW (ref 18–29)
Chloride: 96 mmol/L — ABNORMAL LOW (ref 97–108)
Creatinine, Ser: 0.91 mg/dL (ref 0.76–1.27)
GFR calc Af Amer: 105 mL/min/{1.73_m2} (ref >59)
GFR calc non Af Amer: 91 mL/min/{1.73_m2} (ref >59)
Globulin, Total: 3 g/dL (ref 1.5–4.5)
Glucose: 322 mg/dL — ABNORMAL HIGH (ref 65–99)
Potassium: 4.7 mmol/L (ref 3.5–5.2)
Sodium: 135 mmol/L (ref 134–144)
TOTAL PROTEIN: 7.4 g/dL (ref 6.0–8.5)

## 2015-01-28 LAB — CBC
HEMOGLOBIN: 16.1 g/dL (ref 12.6–17.7)
Hematocrit: 46 % (ref 37.5–51.0)
MCH: 32.4 pg (ref 26.6–33.0)
MCHC: 35 g/dL (ref 31.5–35.7)
MCV: 93 fL (ref 79–97)
PLATELETS: 278 10*3/uL (ref 150–379)
RBC: 4.97 x10E6/uL (ref 4.14–5.80)
RDW: 15.5 % — ABNORMAL HIGH (ref 12.3–15.4)
WBC: 10.2 10*3/uL (ref 3.4–10.8)

## 2015-01-28 LAB — PROTIME-INR
INR: 1 (ref 0.8–1.2)
Prothrombin Time: 10.4 s (ref 9.1–12.0)

## 2015-01-28 LAB — APTT: aPTT: 29 s (ref 24–33)

## 2015-01-29 ENCOUNTER — Other Ambulatory Visit: Payer: Self-pay | Admitting: *Deleted

## 2015-01-29 ENCOUNTER — Telehealth: Payer: Self-pay | Admitting: *Deleted

## 2015-01-29 DIAGNOSIS — R9439 Abnormal result of other cardiovascular function study: Secondary | ICD-10-CM

## 2015-01-29 MED ORDER — PREDNISONE 20 MG PO TABS: 60.0000 mg | ORAL_TABLET | Freq: Once | ORAL | Status: DC

## 2015-01-29 NOTE — Telephone Encounter (Signed)
Patient notified he needs to take 60mg  Prednisone Sunday PM prior to cath.  Patient voiced understanding.  Rx sent to [facility].

## 2015-01-31 NOTE — H&P (View-Only) (Signed)
Patient ID: Robert [name], male   DOB: 1950/10/06, 64 y.o.   MRN: [id number]     Cardiology Office Note   Date:  01/09/2015   ID:  Robert [name], DOB 1951-07-05, MRN [id number]  PCP:  Glo Herring., MD  Cardiologist:   Sanda Klein, MD   chief complaint: Worsening exertional dyspnea   History of Present Illness: Robert [name] is a 64 y.o. male who presents for follow-up for coronary artery disease and reports worsening exertional dyspnea. He becomes short of breath taking a shower or putting his clothes on. This has been going on for roughly 2-1/2 months and seems to predate initiation of Enbrel therapy for rheumatoid arthritis. However he did seem to worsen since Enbrel was initiated. It is not helping his joint complaints. She had been previously enrolled in a controlled trial of a different biological agent for rheumatoid arthritis which provided excellent relief of symptoms. This agent is too expensive for him.  He is also grieving following the sudden death of his daughter who died of what sounds like a massive pulmonary embolism.  He had lab tests performed at [place] last week but I don't yet have those results.  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 He usually presents with exertional angina rather than dyspnea. His last procedure was performed in December 2014 and this is actually a record for Symir, who usually presents for repeat percutaneous intervention every 6-8 months.  - He had his first myocardial infarction in 1995, but I'm not sure which vessel was involved (suspect right coronary artery which is totally occluded) - 2004 showed total occlusion of RCA and intermediate lesions LAD, high grade mid LCX treated with 4x18 mm BMS (Velocity).  - 2006 overlapping 3.5x23 mm Cypher inLCX  - 2007 new downstream stenosis at OM bifurcation treated with 3x18 mm  Cypher stent  - 2008 acute stent thrombosis (allergic to plavix, temporarily stopped Ticlid) - thrombolysis/aspiration only  - April 2013 cutting balloon angioplasty for proximal LCX in stent restenosis (Dr. Claiborne Billings)  - September 2013 repeat restenosis treated with "stent in stent" Xience Expedition 3.25 x 23 mm (Dr. Gwenlyn Found).  - March 2014 left circumflex coronary artery PCI for restenosis, utilizing cutting balloon atherotomy/noncompliant balloon dilatation  - 09/01/2013 presents with ST segment elevation myocardial infarction and received another drug-eluting stent to the left circumflex artery for in-stent restenosis: Promus Premier DES stent 3.5 x 24 mm - No change in RCA or LAD anatomy during last several years.  -Allergic to iodinated contrast, but did well with premedication. Allergic to clopidogrel but tolerates Effient well.   LVEF is around 50-55%. He is allergic to Plavix and he is now receiving Brilinta. He has diabetes mellitus which has generally been poorly controlled and has marked hypertriglyceridemia as well as hypercholesterolemia.   He denies palpitations, lower extremity edema, worsening intermittent claudication, orthopnea or paroxysmal nocturnal dyspnea. He has a daily cough productive of scanty amounts of clear sputum. He continues to smoke a pack of cigarettes every day. His wife also still smokes.    Past Medical History  Diagnosis Date  . Hypertension   . Coronary artery disease   . Dysrhythmia   . CHF (congestive heart failure)   . Unstable angina 12/22/2011  . PVD, chronic LLE 12/22/2011  . High cholesterol   . 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 Chronic bronchitis     "maybe once/yr; last time 04/2012" (05/16/2012)  . Shortness of breath 05/16/2012    "@ rest, lying down, w/exertion"  . Type II diabetes mellitus   . Rheumatoid arthritis(714.0)     "qwhere on my body"  . Abnormal nuclear stress  test 05/17/2012  . S/P angioplasty with stent of LCX 05/16/12 with expedition DES 05/17/2012  . Tobacco abuse     Past Surgical History  Procedure Laterality Date  . Back surgery    . Cholecystectomy  ~1995  . Coronary angioplasty  11/14/2012  . Coronary angioplasty with stent placement  05/16/2012    "1; makes total ~ 4"  . Nm myocar perf wall motion  08/30/2009    No significant ischemia  . Left heart catheterization with coronary angiogram N/A 12/22/2011    Procedure: LEFT HEART CATHETERIZATION WITH CORONARY ANGIOGRAM;  Surgeon: Troy Sine, MD;  Location: [facility] CATH LAB;  Service: Cardiovascular;  Laterality: N/A;  . Percutaneous coronary intervention-balloon only  12/22/2011    Procedure: PERCUTANEOUS CORONARY INTERVENTION-BALLOON ONLY;  Surgeon: Troy Sine, MD;  Location: [facility] CATH LAB;  Service: Cardiovascular;;  . Left heart catheterization with coronary angiogram Bilateral 05/16/2012    Procedure: LEFT HEART CATHETERIZATION WITH CORONARY ANGIOGRAM;  Surgeon: Lorretta Harp, MD;  Location: [facility] CATH LAB;  Service: Cardiovascular;  Laterality: Bilateral;  . Percutaneous coronary stent intervention (pci-s)  05/16/2012    Procedure: PERCUTANEOUS CORONARY STENT INTERVENTION (PCI-S);  Surgeon: Lorretta Harp, MD;  Location: [facility] CATH LAB;  Service: Cardiovascular;;  . Left heart catheterization with coronary angiogram N/A 11/14/2012    Procedure: LEFT HEART CATHETERIZATION WITH CORONARY ANGIOGRAM;  Surgeon: Troy Sine, MD;  Location: [facility] CATH LAB;  Service: Cardiovascular;  Laterality: N/A;  . Percutaneous coronary intervention-balloon only  11/14/2012    Procedure: PERCUTANEOUS CORONARY INTERVENTION-BALLOON ONLY;  Surgeon: Troy Sine, MD;  Location: [facility] CATH LAB;  Service: Cardiovascular;;  . Left heart catheterization with coronary angiogram N/A 09/01/2013    Procedure: LEFT HEART CATHETERIZATION WITH CORONARY ANGIOGRAM;  Surgeon: Leonie Man, MD;  Location: [facility] CATH LAB;  Service:  Cardiovascular;  Laterality: N/A;  . Percutaneous coronary stent intervention (pci-s)  09/01/2013    Procedure: PERCUTANEOUS CORONARY STENT INTERVENTION (PCI-S);  Surgeon: Leonie Man, MD;  Location: [facility] CATH LAB;  Service: Cardiovascular;;     Current Outpatient Prescriptions  Medication Sig Dispense Refill  . acetaminophen (TYLENOL) 325 MG tablet Take 2 tablets (650 mg total) by mouth every 4 (four) hours as needed for headache or mild pain.    [name] ALPRAZolam (XANAX) 0.5 MG tablet Take 0.5 mg by mouth 3 (three) times daily as needed. For anxiety    . aspirin EC 81 MG EC tablet Take 1 tablet (81 mg total) by mouth daily.    . fish oil-omega-3 fatty acids 1000 MG capsule Take 2 g by mouth daily.     . folic acid (FOLVITE) 1 MG tablet Take 1 mg by mouth daily.     [name] gemfibrozil (LOPID) 600 MG tablet 600 mg 2 (two) times daily before a meal.     . glipiZIDE (GLUCOTROL XL) 10 MG 24 hr tablet Take 10 mg by mouth 2 (two) times daily.    . insulin glargine (LANTUS) 100 UNIT/ML injection Inject 80 Units into the skin daily.     . isosorbide mononitrate (IMDUR) 60 MG 24 hr tablet Take 1.5 tablets (90  mg total) by mouth daily. 135 tablet 3  . metFORMIN (GLUCOPHAGE) 1000 MG tablet Take 1 tablet (1,000 mg total) by mouth 2 (two) times daily with a meal.    . methotrexate (RHEUMATREX) 2.5 MG tablet Take 25 mg by mouth once a week. Caution:Chemotherapy. Protect from light. ON THURSDAYS    . metoprolol tartrate (LOPRESSOR) 25 MG tablet TAKE 3 TABLETS TWICE DAILY 540 tablet 3  . nitroGLYCERIN (NITROSTAT) 0.4 MG SL tablet Place 1 tablet (0.4 mg total) under the tongue every 5 (five) minutes x 3 doses as needed for chest pain. 25 tablet 2  . quinapril (ACCUPRIL) 40 MG tablet Take 0.5 tablets (20 mg total) by mouth at bedtime. 45 tablet 2  . simvastatin (ZOCOR) 40 MG tablet Take 1 tablet (40 mg total) by mouth at bedtime. 90 tablet 2  . ticagrelor (BRILINTA) 90 MG TABS tablet Take 1 tablet (90 mg total) by  mouth 2 (two) times daily. 48 tablet 0   No current facility-administered medications for this visit.    Allergies:   Ivp dye; Clopidogrel bisulfate; Iodine; and Plavix    Social History:  The patient  reports that he has been smoking Cigarettes.  He has a 10.25 pack-year smoking history. He has never used smokeless tobacco. He reports that he does not drink alcohol or use illicit drugs.   Family History:  The patient's family history includes Cancer in his mother; Colon cancer in his father; Heart disease in his mother; Hypertension in his mother.    ROS:  Please see the history of present illness.    Otherwise, review of systems positive for none.   All other systems are reviewed and negative.    PHYSICAL EXAM: VS:  BP 124/90 mmHg  Pulse 90  Resp 22  Ht 6\' 1"  (1.854 m)  Wt 225 lb (102.059 kg)  BMI 29.69 kg/m2 , BMI Body mass index is 29.69 kg/(m^2).  General: Alert, oriented x3, no distress; he smells heavily of tobacco  Head: no evidence of trauma, PERRL, EOMI, no exophtalmos or lid lag, no myxedema, no xanthelasma; normal ears, nose and oropharynx  Neck: normal jugular venous pulsations and no hepatojugular reflux; brisk carotid pulses without delay and no carotid bruits  Chest: clear to auscultation, no signs of consolidation by percussion or palpation, normal fremitus, symmetrical and full respiratory excursions  Cardiovascular: normal position and quality of the apical impulse, regular rhythm, normal first and second heart sounds, no murmurs, rubs or gallops  Abdomen: Dear abdominal adiposity, no tenderness or distention, no masses by palpation, no abnormal pulsatility or arterial bruits, normal bowel sounds, no hepatosplenomegaly  Extremities: no clubbing, cyanosis or edema; 2+ radial, ulnar and brachial pulses bilaterally; 2+ right femoral, posterior tibial and dorsalis pedis pulses; 2+ left femoral, posterior tibial and dorsalis pedis pulses; no subclavian or femoral  bruits  Neurological: grossly nonfocal  Psych: euthymic mood, full affect   EKG:  EKG is ordered today. The ekg ordered today demonstrates sinus rhythm, Q waves in leads 3, F and a tall R wave in lead V2 consistent with old inferoposterior infarction, no repolarization abnormalities    Lipid Panel    Component Value Date/Time   CHOL 138 09/02/2013 0223   TRIG 322* 09/02/2013 0223   HDL 29* 09/02/2013 0223   CHOLHDL 4.8 09/02/2013 0223   VLDL 64* 09/02/2013 0223   LDLCALC 45 09/02/2013 0223      Wt Readings from Last 3 Encounters:  01/07/15 225 lb (102.059 kg)  05/25/14 230 lb 11.2 oz (104.645 kg)  12/10/13 239 lb 1.6 oz (108.455 kg)     ASSESSMENT AND PLAN:  1. S/P stent of LCX 05/16/12,in-stent restenosis March 2014 POBA, ISR 09/01/13- overlapping DES placed 16 months after his last ST segment elevation infarction, [place] remains in the window for possible in-stent restenosis.  He currently has social dyspnea rather than exertional angina. It is possible that he has developed congestive heart failure following the inferoposterior myocardial infarction in December 2014. The infarction location with definitely predispose to development of ischemic mitral regurgitation, but I do not appreciate this on physical exam. Repeat echo for assessment of LVEF and mitral valve function. Will follow up in clinic after his echo The absence of angina also raises the possibility that his dyspnea is not cardiac in etiology. He is a lifelong heavy smoker and certainly has emphysema and chronic bronchitis. Have also recommended that he have a chest x-ray.  Tobacco abuse Again advised him that ongoing tobacco use is a big part of his tendency restenosis, together with poorly controlled diabetes. As always he is shamefaced but unable to kick the habit. His recent grief surrounding the loss of his daughter makes it hard to push the subject today.  Dyslipidemia- LDL 45 09/01/13 Excellent LDL  cholesterol, low HDL and high triglycerides as a reflection of poor glycemic control. He is already a combination statin plus fibrate. The solution is better glycemic control  PVD, chronic LLE Not currently symptomatic, although his breathing limits activity and may be covering up intermittent claudication.  DM type 2 causing vascular disease, not at goal   Current medicines are reviewed at length with the patient today.  The patient does not have concerns regarding medicines.  The following changes have been made:  no change  Labs/ tests ordered today include:  Orders Placed This Encounter  Procedures  . DG Chest 2 View  . EKG 12-Lead  . Echocardiogram    Patient Instructions  A chest x-ray takes a picture of the organs and structures inside the chest, including the heart, lungs, and blood vessels. This test can show several things, including, whether the heart is enlarges; whether fluid is building up in the lungs; and whether pacemaker / defibrillator leads are still in place.  [facility]  1st floor.  Your physician has requested that you have an echocardiogram. Echocardiography is a painless test that uses sound waves to create images of your heart. It provides your doctor with information about the size and shape of your heart and how well your heart's chambers and valves are working. This procedure takes approximately one hour. There are no restrictions for this procedure.  Dr. Sallyanne Kuster recommends that you schedule a follow-up appointment in: 1st available.        Mikael Spray, MD  01/09/2015 4:26 PM    Sanda Klein, MD, [facility] [phone] office [phone] pager

## 2015-01-31 NOTE — Interval H&P Note (Signed)
History and Physical Interval Note:  01/31/2015 4:20 PM Noted contrast allergy. Noted appropriate therapy to prevent allergic response. Nuclear with intermediate risk and inferolateral ischemia. Cath Lab Visit (complete for each Cath Lab visit)  Clinical Evaluation Leading to the Procedure:    ACS: No.  Non-ACS:    Anginal Classification: CCS III  Anti-ischemic medical therapy: Maximal Therapy (2 or more classes of medications)  Non-Invasive Test Results: Intermediate-risk stress test findings: cardiac mortality 1-3%/year  Prior CABG: No previous CABG       Robert [name]  has presented today for surgery, with the diagnosis of abormal nuc  The various methods of treatment have been discussed with the patient and family. After consideration of risks, benefits and other options for treatment, the patient has consented to  Procedure(s): Left Heart Cath and Coronary Angiography (N/A) as a surgical intervention .  The patient's history has been reviewed, patient examined, no change in status, stable for surgery.  I have reviewed the patient's chart and labs.  Questions were answered to the patient's satisfaction.     Sinclair Grooms

## 2015-02-01 ENCOUNTER — Ambulatory Visit (HOSPITAL_COMMUNITY)
Admission: RE | Admit: 2015-02-01 | Discharge: 2015-02-02 | Disposition: A | Discharge status: Home / Self Care | Payer: Commercial Managed Care - HMO | Source: Ambulatory Visit | Attending: Interventional Cardiology | Admitting: Interventional Cardiology

## 2015-02-01 ENCOUNTER — Encounter (HOSPITAL_COMMUNITY)
Admission: RE | Disposition: A | Discharge status: Home / Self Care | Payer: Commercial Managed Care - HMO | Source: Ambulatory Visit | Attending: Interventional Cardiology

## 2015-02-01 ENCOUNTER — Encounter (HOSPITAL_COMMUNITY): Payer: Self-pay | Admitting: General Practice

## 2015-02-01 DIAGNOSIS — E1165 Type 2 diabetes mellitus with hyperglycemia: Secondary | ICD-10-CM | POA: Insufficient documentation

## 2015-02-01 DIAGNOSIS — Z794 Long term (current) use of insulin: Secondary | ICD-10-CM | POA: Diagnosis not present

## 2015-02-01 DIAGNOSIS — T82858A Stenosis of vascular prosthetic devices, implants and grafts, initial encounter: Secondary | ICD-10-CM | POA: Diagnosis not present

## 2015-02-01 DIAGNOSIS — R0600 Dyspnea, unspecified: Secondary | ICD-10-CM | POA: Diagnosis present

## 2015-02-01 DIAGNOSIS — I2510 Atherosclerotic heart disease of native coronary artery without angina pectoris: Secondary | ICD-10-CM | POA: Insufficient documentation

## 2015-02-01 DIAGNOSIS — E1159 Type 2 diabetes mellitus with other circulatory complications: Secondary | ICD-10-CM | POA: Diagnosis not present

## 2015-02-01 DIAGNOSIS — M069 Rheumatoid arthritis, unspecified: Secondary | ICD-10-CM | POA: Insufficient documentation

## 2015-02-01 DIAGNOSIS — Z720 Tobacco use: Secondary | ICD-10-CM | POA: Diagnosis present

## 2015-02-01 DIAGNOSIS — I2582 Chronic total occlusion of coronary artery: Secondary | ICD-10-CM | POA: Diagnosis not present

## 2015-02-01 DIAGNOSIS — E785 Hyperlipidemia, unspecified: Secondary | ICD-10-CM | POA: Insufficient documentation

## 2015-02-01 DIAGNOSIS — F17210 Nicotine dependence, cigarettes, uncomplicated: Secondary | ICD-10-CM | POA: Diagnosis not present

## 2015-02-01 DIAGNOSIS — I10 Essential (primary) hypertension: Secondary | ICD-10-CM | POA: Diagnosis present

## 2015-02-01 DIAGNOSIS — J449 Chronic obstructive pulmonary disease, unspecified: Secondary | ICD-10-CM | POA: Insufficient documentation

## 2015-02-01 DIAGNOSIS — I739 Peripheral vascular disease, unspecified: Secondary | ICD-10-CM | POA: Diagnosis not present

## 2015-02-01 DIAGNOSIS — I255 Ischemic cardiomyopathy: Secondary | ICD-10-CM | POA: Diagnosis not present

## 2015-02-01 DIAGNOSIS — Z955 Presence of coronary angioplasty implant and graft: Secondary | ICD-10-CM | POA: Insufficient documentation

## 2015-02-01 DIAGNOSIS — E119 Type 2 diabetes mellitus without complications: Secondary | ICD-10-CM

## 2015-02-01 DIAGNOSIS — I25119 Atherosclerotic heart disease of native coronary artery with unspecified angina pectoris: Secondary | ICD-10-CM

## 2015-02-01 DIAGNOSIS — Z91041 Radiographic dye allergy status: Secondary | ICD-10-CM | POA: Diagnosis present

## 2015-02-01 DIAGNOSIS — R9439 Abnormal result of other cardiovascular function study: Secondary | ICD-10-CM | POA: Diagnosis present

## 2015-02-01 DIAGNOSIS — Z95820 Peripheral vascular angioplasty status with implants and grafts: Secondary | ICD-10-CM

## 2015-02-01 HISTORY — PX: CARDIAC CATHETERIZATION: SHX172

## 2015-02-01 HISTORY — DX: Anxiety disorder, unspecified: F41.9

## 2015-02-01 LAB — GLUCOSE, CAPILLARY
GLUCOSE-CAPILLARY: 413 mg/dL — AB (ref 65–99)
GLUCOSE-CAPILLARY: 333 mg/dL — AB (ref 65–99)
Glucose-Capillary: 411 mg/dL — ABNORMAL HIGH (ref 65–99)
Glucose-Capillary: 383 mg/dL — ABNORMAL HIGH (ref 65–99)

## 2015-02-01 LAB — POCT ACTIVATED CLOTTING TIME
ACTIVATED CLOTTING TIME: 306 s
ACTIVATED CLOTTING TIME: 356 s

## 2015-02-01 SURGERY — LEFT HEART CATH AND CORONARY ANGIOGRAPHY

## 2015-02-01 MED ORDER — FOLIC ACID 1 MG PO TABS
1.0000 mg | ORAL_TABLET | Freq: Every day | ORAL | Status: DC
  Administered 2015-02-01 – 2015-02-02 (×2): 1 mg via ORAL
  Filled 2015-02-01 (×2): qty 1

## 2015-02-01 MED ORDER — SODIUM CHLORIDE 0.9 % WEIGHT BASED INFUSION
3.0000 mL/kg/h | INTRAVENOUS | Status: AC
  Administered 2015-02-01: 3 mL/kg/h via INTRAVENOUS

## 2015-02-01 MED ORDER — METOPROLOL TARTRATE 50 MG PO TABS
75.0000 mg | ORAL_TABLET | Freq: Two times a day (BID) | ORAL | Status: DC
  Administered 2015-02-01 – 2015-02-02 (×2): 75 mg via ORAL
  Filled 2015-02-01 (×5): qty 2

## 2015-02-01 MED ORDER — ISOSORBIDE MONONITRATE ER 60 MG PO TB24
90.0000 mg | ORAL_TABLET | Freq: Every day | ORAL | Status: DC
  Administered 2015-02-01 – 2015-02-02 (×2): 90 mg via ORAL
  Filled 2015-02-01 (×4): qty 1

## 2015-02-01 MED ORDER — OMEGA-3 FATTY ACIDS 1000 MG PO CAPS: 1.0000 g | ORAL_CAPSULE | Freq: Two times a day (BID) | ORAL | Status: DC

## 2015-02-01 MED ORDER — NITROGLYCERIN 0.4 MG SL SUBL: 0.4000 mg | SUBLINGUAL_TABLET | SUBLINGUAL | Status: DC | PRN

## 2015-02-01 MED ORDER — INSULIN ASPART 100 UNIT/ML ~~LOC~~ SOLN
0.0000 [IU] | Freq: Three times a day (TID) | SUBCUTANEOUS | Status: DC
  Administered 2015-02-01: 15 [IU] via SUBCUTANEOUS
  Administered 2015-02-01: 19:00:00 20 [IU] via SUBCUTANEOUS
  Administered 2015-02-02: 07:00:00 11 [IU] via SUBCUTANEOUS

## 2015-02-01 MED ORDER — LISINOPRIL 10 MG PO TABS
20.0000 mg | ORAL_TABLET | Freq: Every day | ORAL | Status: DC
  Administered 2015-02-01: 21:00:00 20 mg via ORAL
  Filled 2015-02-01: qty 2

## 2015-02-01 MED ORDER — SODIUM CHLORIDE 0.9 % IJ SOLN: 3.0000 mL | INTRAMUSCULAR | Status: DC | PRN

## 2015-02-01 MED ORDER — GEMFIBROZIL 600 MG PO TABS
600.0000 mg | ORAL_TABLET | Freq: Two times a day (BID) | ORAL | Status: DC
  Administered 2015-02-01 – 2015-02-02 (×2): 600 mg via ORAL
  Filled 2015-02-01 (×5): qty 1

## 2015-02-01 MED ORDER — IOHEXOL 350 MG/ML SOLN
INTRAVENOUS | Status: DC | PRN
  Administered 2015-02-01: 50 mL via INTRAVENOUS
  Administered 2015-02-01: 100 mL via INTRAVENOUS

## 2015-02-01 MED ORDER — DIPHENHYDRAMINE HCL 50 MG/ML IJ SOLN
25.0000 mg | INTRAMUSCULAR | Status: AC
  Administered 2015-02-01: 25 mg via INTRAVENOUS

## 2015-02-01 MED ORDER — GLIPIZIDE ER 10 MG PO TB24
10.0000 mg | ORAL_TABLET | Freq: Two times a day (BID) | ORAL | Status: DC
  Administered 2015-02-01 – 2015-02-02 (×2): 10 mg via ORAL
  Filled 2015-02-01 (×5): qty 1

## 2015-02-01 MED ORDER — ALPRAZOLAM 0.25 MG PO TABS
0.2500 mg | ORAL_TABLET | Freq: Three times a day (TID) | ORAL | Status: DC
  Administered 2015-02-01 – 2015-02-02 (×4): 0.25 mg via ORAL
  Filled 2015-02-01 (×4): qty 1

## 2015-02-01 MED ORDER — VERAPAMIL HCL 2.5 MG/ML IV SOLN
INTRAVENOUS | Status: AC
  Filled 2015-02-01: qty 2

## 2015-02-01 MED ORDER — METHYLPREDNISOLONE SODIUM SUCC 125 MG IJ SOLR
125.0000 mg | INTRAMUSCULAR | Status: AC
  Administered 2015-02-01: 125 mg via INTRAVENOUS

## 2015-02-01 MED ORDER — INSULIN ASPART 100 UNIT/ML ~~LOC~~ SOLN
0.0000 [IU] | Freq: Every day | SUBCUTANEOUS | Status: DC
  Administered 2015-02-01: 22:00:00 5 [IU] via SUBCUTANEOUS

## 2015-02-01 MED ORDER — TICAGRELOR 90 MG PO TABS
ORAL_TABLET | ORAL | Status: DC | PRN
  Administered 2015-02-01: 180 mg via ORAL

## 2015-02-01 MED ORDER — ASPIRIN 81 MG PO CHEW
81.0000 mg | CHEWABLE_TABLET | Freq: Every day | ORAL | Status: DC
  Administered 2015-02-02: 81 mg via ORAL
  Filled 2015-02-01: qty 1

## 2015-02-01 MED ORDER — DIPHENHYDRAMINE HCL 50 MG/ML IJ SOLN
INTRAMUSCULAR | Status: AC
  Filled 2015-02-01: qty 1

## 2015-02-01 MED ORDER — FENTANYL CITRATE (PF) 100 MCG/2ML IJ SOLN
INTRAMUSCULAR | Status: AC
  Filled 2015-02-01: qty 2

## 2015-02-01 MED ORDER — SIMVASTATIN 40 MG PO TABS
40.0000 mg | ORAL_TABLET | Freq: Every day | ORAL | Status: DC
  Administered 2015-02-01: 21:00:00 40 mg via ORAL
  Filled 2015-02-01: qty 2
  Filled 2015-02-01 (×2): qty 1

## 2015-02-01 MED ORDER — INSULIN ASPART 100 UNIT/ML ~~LOC~~ SOLN
6.0000 [IU] | Freq: Once | SUBCUTANEOUS | Status: AC
  Administered 2015-02-01: 6 [IU] via SUBCUTANEOUS
  Filled 2015-02-01: qty 0.06

## 2015-02-01 MED ORDER — SODIUM CHLORIDE 0.9 % WEIGHT BASED INFUSION
3.0000 mL/kg/h | INTRAVENOUS | Status: DC
  Administered 2015-02-01: 3 mL/kg/h via INTRAVENOUS

## 2015-02-01 MED ORDER — FENTANYL CITRATE (PF) 100 MCG/2ML IJ SOLN
INTRAMUSCULAR | Status: DC | PRN
  Administered 2015-02-01 (×2): 50 ug via INTRAVENOUS

## 2015-02-01 MED ORDER — MORPHINE SULFATE 2 MG/ML IJ SOLN: 2.0000 mg | INTRAMUSCULAR | Status: DC | PRN

## 2015-02-01 MED ORDER — MIDAZOLAM HCL 2 MG/2ML IJ SOLN
INTRAMUSCULAR | Status: DC | PRN
  Administered 2015-02-01 (×4): 1 mg via INTRAVENOUS

## 2015-02-01 MED ORDER — SODIUM CHLORIDE 0.9 % IV SOLN: 250.0000 mL | INTRAVENOUS | Status: DC | PRN

## 2015-02-01 MED ORDER — INSULIN ASPART 100 UNIT/ML ~~LOC~~ SOLN: 0.0000 [IU] | Freq: Three times a day (TID) | SUBCUTANEOUS | Status: DC

## 2015-02-01 MED ORDER — METFORMIN HCL 500 MG PO TABS
1000.0000 mg | ORAL_TABLET | Freq: Two times a day (BID) | ORAL | Status: DC
  Filled 2015-02-01: qty 2

## 2015-02-01 MED ORDER — INSULIN GLARGINE 100 UNIT/ML ~~LOC~~ SOLN
80.0000 [IU] | Freq: Every day | SUBCUTANEOUS | Status: DC
  Administered 2015-02-01: 80 [IU] via SUBCUTANEOUS
  Filled 2015-02-01 (×2): qty 0.8

## 2015-02-01 MED ORDER — TICAGRELOR 90 MG PO TABS
90.0000 mg | ORAL_TABLET | Freq: Two times a day (BID) | ORAL | Status: DC
  Administered 2015-02-01 – 2015-02-02 (×2): 90 mg via ORAL
  Filled 2015-02-01 (×2): qty 1

## 2015-02-01 MED ORDER — FAMOTIDINE IN NACL 20-0.9 MG/50ML-% IV SOLN
20.0000 mg | INTRAVENOUS | Status: AC
  Administered 2015-02-01: 20 mg via INTRAVENOUS

## 2015-02-01 MED ORDER — SODIUM CHLORIDE 0.9 % WEIGHT BASED INFUSION
1.0000 mL/kg/h | INTRAVENOUS | Status: DC
  Administered 2015-02-01: 1 mL/kg/h via INTRAVENOUS

## 2015-02-01 MED ORDER — FAMOTIDINE IN NACL 20-0.9 MG/50ML-% IV SOLN
INTRAVENOUS | Status: AC
  Filled 2015-02-01: qty 50

## 2015-02-01 MED ORDER — ACETAMINOPHEN 325 MG PO TABS: 650.0000 mg | ORAL_TABLET | ORAL | Status: DC | PRN

## 2015-02-01 MED ORDER — TICAGRELOR 90 MG PO TABS
ORAL_TABLET | ORAL | Status: AC
  Filled 2015-02-01: qty 2

## 2015-02-01 MED ORDER — LIDOCAINE HCL (PF) 1 % IJ SOLN
INTRAMUSCULAR | Status: AC
  Filled 2015-02-01: qty 30

## 2015-02-01 MED ORDER — MIDAZOLAM HCL 2 MG/2ML IJ SOLN
INTRAMUSCULAR | Status: AC
  Filled 2015-02-01: qty 2

## 2015-02-01 MED ORDER — ONDANSETRON HCL 4 MG/2ML IJ SOLN: 4.0000 mg | Freq: Four times a day (QID) | INTRAMUSCULAR | Status: DC | PRN

## 2015-02-01 MED ORDER — HEPARIN (PORCINE) IN NACL 2-0.9 UNIT/ML-% IJ SOLN
INTRAMUSCULAR | Status: AC
  Filled 2015-02-01: qty 1000

## 2015-02-01 MED ORDER — METHYLPREDNISOLONE SODIUM SUCC 125 MG IJ SOLR
INTRAMUSCULAR | Status: AC
  Filled 2015-02-01: qty 2

## 2015-02-01 MED ORDER — ASPIRIN 81 MG PO CHEW
81.0000 mg | CHEWABLE_TABLET | ORAL | Status: AC
  Administered 2015-02-01: 81 mg via ORAL

## 2015-02-01 MED ORDER — NITROGLYCERIN 1 MG/10 ML FOR IR/CATH LAB
INTRA_ARTERIAL | Status: AC
  Filled 2015-02-01: qty 10

## 2015-02-01 MED ORDER — INSULIN ASPART 100 UNIT/ML ~~LOC~~ SOLN
SUBCUTANEOUS | Status: AC
  Administered 2015-02-01: 6 [IU] via SUBCUTANEOUS
  Filled 2015-02-01: qty 1

## 2015-02-01 MED ORDER — IOHEXOL 300 MG/ML  SOLN
INTRAMUSCULAR | Status: DC | PRN
  Administered 2015-02-01: 100 mL via INTRAVENOUS

## 2015-02-01 MED ORDER — OMEGA-3-ACID ETHYL ESTERS 1 G PO CAPS
1.0000 g | ORAL_CAPSULE | Freq: Two times a day (BID) | ORAL | Status: DC
  Administered 2015-02-01 – 2015-02-02 (×3): 1 g via ORAL
  Filled 2015-02-01 (×3): qty 1

## 2015-02-01 MED ORDER — ASPIRIN 81 MG PO CHEW
CHEWABLE_TABLET | ORAL | Status: AC
  Filled 2015-02-01: qty 1

## 2015-02-01 MED ORDER — HEPARIN SODIUM (PORCINE) 1000 UNIT/ML IJ SOLN
INTRAMUSCULAR | Status: DC | PRN
  Administered 2015-02-01: 3000 [IU] via INTRAVENOUS
  Administered 2015-02-01 (×2): 5000 [IU] via INTRAVENOUS

## 2015-02-01 MED ORDER — HEPARIN SODIUM (PORCINE) 1000 UNIT/ML IJ SOLN
INTRAMUSCULAR | Status: AC
  Filled 2015-02-01: qty 1

## 2015-02-01 MED ORDER — HYDROXYCHLOROQUINE SULFATE 200 MG PO TABS
200.0000 mg | ORAL_TABLET | Freq: Two times a day (BID) | ORAL | Status: DC
  Administered 2015-02-01 – 2015-02-02 (×2): 200 mg via ORAL
  Filled 2015-02-01 (×3): qty 1

## 2015-02-01 MED ORDER — SODIUM CHLORIDE 0.9 % IJ SOLN: 3.0000 mL | Freq: Two times a day (BID) | INTRAMUSCULAR | Status: DC

## 2015-02-01 MED ORDER — SULFASALAZINE 500 MG PO TABS
500.0000 mg | ORAL_TABLET | Freq: Two times a day (BID) | ORAL | Status: DC
  Administered 2015-02-01 – 2015-02-02 (×2): 500 mg via ORAL
  Filled 2015-02-01 (×3): qty 1

## 2015-02-01 MED ORDER — QUINAPRIL HCL 10 MG PO TABS: 20.0000 mg | ORAL_TABLET | Freq: Every day | ORAL | Status: DC

## 2015-02-01 MED ORDER — METHOTREXATE 2.5 MG PO TABS: 25.0000 mg | ORAL_TABLET | ORAL | Status: DC

## 2015-02-01 SURGICAL SUPPLY — 20 items
BALLN ANGIOSCULPT RX 3.5X10 (BALLOONS) ×3
BALLN ~~LOC~~ TREK RX 4.0X20 (BALLOONS) ×2 IMPLANT
BALLOON ANGIOSCULPT RX 3.5X10 (BALLOONS) IMPLANT
CATH INFINITI 5 FR JL3.5 (CATHETERS) ×3 IMPLANT
CATH INFINITI JR4 5F (CATHETERS) ×3 IMPLANT
CATH LAUNCHER 5F EBU3.0 (CATHETERS) IMPLANT
CATH LAUNCHER 5F EBU3.5 (CATHETERS) ×2 IMPLANT
CATHETER LAUNCHER 5F EBU3.0 (CATHETERS) ×3
CUTTING BAL FLEXTOME RX 4.0X10 (BALLOONS) ×2 IMPLANT
DEVICE RAD COMP TR BAND LRG (VASCULAR PRODUCTS) ×3 IMPLANT
GLIDESHEATH SLEND A-KIT 6F 22G (SHEATH) ×3 IMPLANT
GUIDE CATH RUNWAY 6FR CLS3 (CATHETERS) ×2 IMPLANT
KIT ENCORE 26 ADVANTAGE (KITS) ×2 IMPLANT
KIT HEART LEFT (KITS) ×3 IMPLANT
PACK CARDIAC CATHETERIZATION (CUSTOM PROCEDURE TRAY) ×3 IMPLANT
TRANSDUCER W/STOPCOCK (MISCELLANEOUS) ×3 IMPLANT
TUBING CIL FLEX 10 FLL-RA (TUBING) ×3 IMPLANT
WIRE ASAHI PROWATER 300CM (WIRE) IMPLANT
WIRE HI TORQ BMW 190CM (WIRE) ×2 IMPLANT
WIRE SAFE-T 1.5MM-J .035X260CM (WIRE) ×3 IMPLANT

## 2015-02-01 NOTE — Progress Notes (Signed)
TR BAND REMOVAL  LOCATION:  right radial  DEFLATED PER PROTOCOL:  Yes.    TIME BAND OFF / DRESSING APPLIED:   1430   SITE UPON ARRIVAL:   Level 0  SITE AFTER BAND REMOVAL:  Level 0  REVERSE ALLEN'S TEST:    positive  CIRCULATION SENSATION AND MOVEMENT:  Within Normal Limits  Yes.    COMMENTS:    

## 2015-02-01 NOTE — Interval H&P Note (Signed)
History and Physical Interval Note:  02/01/2015 7:03 AM Multiple Cfx interventions with known RCA CTO and moderate LAD. If Cfx restenosis, may better served with CABG.Cath Lab Visit (complete for each Cath Lab visit)  Clinical Evaluation Leading to the Procedure:   ACS: No.  Non-ACS:    Anginal Classification: CCS III  Anti-ischemic medical therapy: Maximal Therapy (2 or more classes of medications)  Non-Invasive Test Results: No non-invasive testing performed  Prior CABG: No previous CABG       Burnard Hawthorne  has presented today for surgery, with the diagnosis of abormal nuc  The various methods of treatment have been discussed with the patient and family. After consideration of risks, benefits and other options for treatment, the patient has consented to  Procedure(s): Left Heart Cath and Coronary Angiography (N/A) as a surgical intervention .  The patient's history has been reviewed, patient examined, no change in status, stable for surgery.  I have reviewed the patient's chart and labs.  Questions were answered to the patient's satisfaction.     Sinclair Grooms

## 2015-02-01 NOTE — Progress Notes (Addendum)
  Whitman Hero RN,BSN,CM [phone] CM spoke with pt regarding Brilinta. Pt stated was taking Brilinta prior to hospitalization and  Is  aware of cost associated with it. Pt uses [phone], in [place] , [place].CM called pharmacy and confirmed brilinta is in stock. No other needs identified @ present time.

## 2015-02-01 NOTE — Progress Notes (Addendum)
Inpatient Diabetes Program Recommendations  AACE/ADA: New Consensus Statement on Inpatient Glycemic Control (2013)  Target Ranges:  Prepandial:   less than 140 mg/dL      Peak postprandial:   less than 180 mg/dL (1-2 hours)      Critically ill patients:  140 - 180 mg/dL   Results for NIKOLUS, MARCZAK (MRN [id number]) as of 02/01/2015 10:32  Ref. Range 02/01/2015 06:30  Glucose-Capillary Latest Ref Range: 65-99 mg/dL 413 (H)   Diabetes history: DM 2 Outpatient Diabetes medications: Lantus 80 units QHS, Metformin 1,000 mg BID, Glipizide 10 mg BID Current orders for Inpatient glycemic control: Lantus 80 units QHS, Metformin 1,000 mg BID, Glipizide 10 mg BID  Inpatient Diabetes Program Recommendations  Insulin - Basal: Glucose levels are in the 400's. Basal insulin orders are for QHS. Please consider ordering a dose for NOW. Insulin - Correction: Please consider ordering CBG's and Novolog sensitive correction (0-9 units) TID and HS.  Oral Agents: If patient came back from a cath. Please hold Metformin for 48 hours post procedure.  Note: Patient received 125 mg of IV solumedrol. This is effecting hyperglycemia.  Thanks,  Tama Headings RN, MSN, [facility] Inpatient Diabetes Coordinator Team Pager [phone]

## 2015-02-01 NOTE — Progress Notes (Signed)
CBG 411, will give the maximum dose on SSI of 20 units per Almyra Deforest, PA.

## 2015-02-02 ENCOUNTER — Encounter (HOSPITAL_COMMUNITY): Payer: Self-pay | Admitting: Interventional Cardiology

## 2015-02-02 DIAGNOSIS — I10 Essential (primary) hypertension: Secondary | ICD-10-CM

## 2015-02-02 DIAGNOSIS — E1159 Type 2 diabetes mellitus with other circulatory complications: Secondary | ICD-10-CM | POA: Diagnosis not present

## 2015-02-02 DIAGNOSIS — I25118 Atherosclerotic heart disease of native coronary artery with other forms of angina pectoris: Secondary | ICD-10-CM | POA: Diagnosis not present

## 2015-02-02 DIAGNOSIS — I255 Ischemic cardiomyopathy: Secondary | ICD-10-CM | POA: Diagnosis present

## 2015-02-02 DIAGNOSIS — T82858A Stenosis of vascular prosthetic devices, implants and grafts, initial encounter: Secondary | ICD-10-CM | POA: Diagnosis not present

## 2015-02-02 DIAGNOSIS — E785 Hyperlipidemia, unspecified: Secondary | ICD-10-CM | POA: Diagnosis not present

## 2015-02-02 DIAGNOSIS — J449 Chronic obstructive pulmonary disease, unspecified: Secondary | ICD-10-CM | POA: Diagnosis not present

## 2015-02-02 DIAGNOSIS — Z794 Long term (current) use of insulin: Secondary | ICD-10-CM | POA: Diagnosis not present

## 2015-02-02 DIAGNOSIS — I2510 Atherosclerotic heart disease of native coronary artery without angina pectoris: Secondary | ICD-10-CM | POA: Diagnosis not present

## 2015-02-02 DIAGNOSIS — I2582 Chronic total occlusion of coronary artery: Secondary | ICD-10-CM | POA: Diagnosis not present

## 2015-02-02 DIAGNOSIS — M069 Rheumatoid arthritis, unspecified: Secondary | ICD-10-CM | POA: Diagnosis not present

## 2015-02-02 DIAGNOSIS — R931 Abnormal findings on diagnostic imaging of heart and coronary circulation: Secondary | ICD-10-CM

## 2015-02-02 LAB — BASIC METABOLIC PANEL
ANION GAP: 5 (ref 5–15)
BUN: 17 mg/dL (ref 6–20)
CO2: 22 mmol/L (ref 22–32)
CREATININE: 0.93 mg/dL (ref 0.61–1.24)
Calcium: 9.1 mg/dL (ref 8.9–10.3)
Chloride: 104 mmol/L (ref 101–111)
GFR calc Af Amer: >60 mL/min (ref >60)
GFR calc non Af Amer: >60 mL/min (ref >60)
GLUCOSE: 275 mg/dL — AB (ref 65–99)
POTASSIUM: 3.7 mmol/L (ref 3.5–5.1)
Sodium: 131 mmol/L — ABNORMAL LOW (ref 135–145)

## 2015-02-02 LAB — GLUCOSE, CAPILLARY: Glucose-Capillary: 261 mg/dL — ABNORMAL HIGH (ref 65–99)

## 2015-02-02 LAB — CBC
HEMATOCRIT: 39.5 % (ref 39.0–52.0)
HEMOGLOBIN: 13.4 g/dL (ref 13.0–17.0)
MCH: 30.8 pg (ref 26.0–34.0)
MCHC: 33.9 g/dL (ref 30.0–36.0)
MCV: 90.8 fL (ref 78.0–100.0)
Platelets: 191 10*3/uL (ref 150–400)
RBC: 4.35 MIL/uL (ref 4.22–5.81)
RDW: 14.8 % (ref 11.5–15.5)
WBC: 13.2 10*3/uL — ABNORMAL HIGH (ref 4.0–10.5)

## 2015-02-02 LAB — HEMOGLOBIN A1C
Hgb A1c MFr Bld: 12.4 % — ABNORMAL HIGH (ref 4.8–5.6)
MEAN PLASMA GLUCOSE: 309 mg/dL

## 2015-02-02 MED ORDER — ALUM & MAG HYDROXIDE-SIMETH 200-200-20 MG/5ML PO SUSP
30.0000 mL | ORAL | Status: DC | PRN
  Administered 2015-02-02: 30 mL via ORAL
  Filled 2015-02-02: qty 30

## 2015-02-02 MED ORDER — METFORMIN HCL 1000 MG PO TABS: 1000.0000 mg | ORAL_TABLET | Freq: Two times a day (BID) | ORAL | Status: DC

## 2015-02-02 MED FILL — Lidocaine HCl Local Preservative Free (PF) Inj 1%: INTRAMUSCULAR | Qty: 30 | Status: AC

## 2015-02-02 MED FILL — Heparin Sodium (Porcine) 2 Unit/ML in Sodium Chloride 0.9%: INTRAMUSCULAR | Qty: 1000 | Status: AC

## 2015-02-02 NOTE — Discharge Instructions (Signed)
Coronary Angioplasty Coronary angioplasty is a procedure to widen a narrowed or blocked blood vessel of the heart (coronary arteries). The artery is usually blocked by cholesterol buildup (plaque) in the lining or walls. When a vessel in the heart becomes partially blocked, there is decreased blood flow to that area. This may lead to chest pain or a heart attack (myocardial infarction).  LET [facility] CARE PROVIDER KNOW ABOUT:  Any allergies you have, including allergies to shellfish or contrast dye.  All medicines you are taking, including vitamins, herbs, eye drops, creams, and over-the-counter medicines.  Previous problems you or members of your family have had with the use of anesthetics.  Any blood disorders you have.  Previous surgeries you have had.  Any previous kidney problems or failure you have had.  Medical conditions you have.  Possibility of pregnancy, if this applies. RISKS AND COMPLICATIONS Generally, this is a safe procedure. However, problems can occur and include:   Injury to the blood vessels, including rupture or bleeding.   Infection, bleeding, or bruising at the catheter site.   Allergic reaction to the dye or contrast used.   Kidney damage from the dye or contrast used.   Blood clots that can lead to a stroke or heart attack.   Bleeding into the abdomen (retroperitoneal bleeding). BEFORE THE PROCEDURE  Do not eat or drink anything after midnight on the night before the procedure or as directed by your health care provider.  Ask your health care provider if you can take a sip of water with any approved medicines the morning of the procedure.   Ask your health care provider about changing or stopping your regular medicines. This is especially important if you are taking diabetes medicines or blood thinners. PROCEDURE  You may be given a medicine through an IV tube to help you relax (sedative) before and during the procedure.   The area where  the catheter will be inserted will be washed and shaved. This is usually done in the groin but may be done in the fold of your arm (near your elbow) or in the wrist.   A medicine will be given to numb the area where the catheter will be inserted (local anesthetic).   The catheter will be inserted into an artery using a guide wire. The catheter will be guided to the location of the narrowed or blocked artery with a type of X-ray called fluoroscopy.   Dye will then be injected and X-rays taken. The dye will help to show where any narrowing or blockages are located.   Once positioned at the narrowed or blocked portion of the blood vessel, a balloon will be inflated to make the artery wider. Expanding the balloon will crush the plaque into the wall of the vessel and improve the blood flow.   Sometimes the artery may be made wider by removing plaque using a laser or other tools.   When the blood flow is better, the balloon will be deflated and the catheter will be removed.   After the catheter is removed, a special dressing will be placed over the insertion site. AFTER THE PROCEDURE  If the procedure is done through the leg, you will be kept in bed lying flat for 6 hours. You will be told to not bend or cross your legs.   The insertion site will be checked often.   The pulse in your feet or wrist will be checked often.   Additional blood tests, X-rays, and an  electrocardiogram (or electrocardiography) may be done.  Document Released: 08/25/2000 Document Revised: 01/12/2014 Document Reviewed: 05/05/2013 [facility] Patient Information 2015 [place], [place]. This information is not intended to replace advice given to you by your health care provider. Make sure you discuss any questions you have with your health care provider.

## 2015-02-02 NOTE — Progress Notes (Signed)
TELEMETRY: Reviewed telemetry pt in NSR with occ. PVC: Filed Vitals:   02/01/15 1936 02/01/15 2000 02/02/15 0026 02/02/15 0625  BP: 145/77  112/63 112/64  Pulse: 83  67 71  Temp: 97.5 F (36.4 C)  97.8 F (36.6 C) 98 F (36.7 C)  TempSrc: Oral  Oral Oral  Resp: 23 21 17 20   Height:      Weight:   102.8 kg (226 lb 10.1 oz)   SpO2: 96%  97% 95%    Intake/Output Summary (Last 24 hours) at 02/02/15 0707 Last data filed at 02/02/15 6812  Gross per 24 hour  Intake 2278.35 ml  Output   4325 ml  Net -2046.65 ml   Filed Weights   02/01/15 0618 02/02/15 0026  Weight: 103.42 kg (228 lb) 102.8 kg (226 lb 10.1 oz)    Subjective Feels well. No chest pain or dyspnea. No access site pain.  [name] ALPRAZolam  0.25 mg Oral TID  . aspirin  81 mg Oral Daily  . folic acid  1 mg Oral Daily  . gemfibrozil  600 mg Oral BID AC  . glipiZIDE  10 mg Oral BID AC  . hydroxychloroquine  200 mg Oral BID  . insulin aspart  0-20 Units Subcutaneous TID WC  . insulin aspart  0-5 Units Subcutaneous QHS  . insulin glargine  80 Units Subcutaneous QHS  . isosorbide mononitrate  90 mg Oral Daily  . lisinopril  20 mg Oral QHS  . [START ON 02/03/2015] metFORMIN  1,000 mg Oral BID WC  . [START ON 02/04/2015] methotrexate  25 mg Oral Weekly  . metoprolol tartrate  75 mg Oral BID  . omega-3 acid ethyl esters  1 g Oral BID  . simvastatin  40 mg Oral QHS  . sulfaSALAzine  500 mg Oral BID  . ticagrelor  90 mg Oral BID      LABS: Basic Metabolic Panel:  Recent Labs  02/02/15 0400  NA 131*  K 3.7  CL 104  CO2 22  GLUCOSE 275*  BUN 17  CREATININE 0.93  CALCIUM 9.1   Liver Function Tests: No results for input(s): AST, ALT, ALKPHOS, BILITOT, PROT, ALBUMIN in the last 72 hours. No results for input(s): LIPASE, AMYLASE in the last 72 hours. CBC:  Recent Labs  02/02/15 0400  WBC 13.2*  HGB 13.4  HCT 39.5  MCV 90.8  PLT 191   Cardiac Enzymes: No results for input(s): CKTOTAL, CKMB, CKMBINDEX,  TROPONINI in the last 72 hours. BNP: No results for input(s): PROBNP in the last 72 hours. D-Dimer: No results for input(s): DDIMER in the last 72 hours. Hemoglobin A1C: No results for input(s): HGBA1C in the last 72 hours. Fasting Lipid Panel: No results for input(s): CHOL, HDL, LDLCALC, TRIG, CHOLHDL, LDLDIRECT in the last 72 hours. Thyroid Function Tests: No results for input(s): TSH, T4TOTAL, T3FREE, THYROIDAB in the last 72 hours.  Invalid input(s): FREET3  Ecg today. NSR without acute change.   Radiology/Studies:  No results found.  PHYSICAL EXAM General: Well developed, well nourished, in no acute distress. Head: Normocephalic, atraumatic, sclera non-icteric, oropharynx is clear Neck: Negative for carotid bruits. JVD not elevated. No adenopathy Lungs: Clear bilaterally to auscultation without wheezes, rales, or rhonchi. Breathing is unlabored. Heart: RRR S1 S2 without murmurs, rubs, or gallops.  Abdomen: Soft, non-tender, non-distended with normoactive bowel sounds. No hepatomegaly. No rebound/guarding. No obvious abdominal masses. Msk:  Strength and tone appears normal for age. Extremities: No clubbing, cyanosis or edema.  Distal pedal pulses are 2+ and equal bilaterally. Neuro: Alert and oriented X 3. Moves all extremities spontaneously. Psych:  Responds to questions appropriately with a normal affect.  ASSESSMENT AND PLAN: 1. CAD s/p repeat PTCA of LCx for instent restenosis. Prior history of recurrent stenosis in this stent. CTO of RCA. On ASA and Brilinta. Stable for DC today. Labs OK except high BS. Encourage smoking cessation. 2. Tobacco abuse. Needs to stop 3. DM poorly controlled. On high dose lantus. Metformin on hold for cath. BS higher with steroids used to treat dye allergy. May need prn novolog until back on stable regimen. 4. HTN controlled.  5. PAD 6. Dyslipidemia. On combination of lopid and statin.   Present on Admission:  . Abnormal nuclear stress  test . CAD (coronary artery disease) . Chest pain . Dyspnea . HTN (hypertension)  Signed, Peter Martinique, [place] 02/02/2015 7:07 AM

## 2015-02-02 NOTE — Progress Notes (Signed)
CARDIAC REHAB PHASE I   PRE:  Rate/Rhythm: 81 SR PVC  BP:  Supine:   Sitting: 102/58  Standing:    SaO2:   MODE:  Ambulation: 600 ft   POST:  Rate/Rhythm: 89  BP:  Supine:   Sitting: 125/60  Standing:    SaO2:  [id number] Pt walked 600 ft with steady gait and no SOB. Tolerated well. Stated could not walk that far prior to PCI. Pt knows what to do as we have seen him in past and as he stated, just doing it. Has quit smoking once for 6 months and plans to use nicotine patches starting today. Has fake cigarette from 2014. Discussed NTG use, walking as tolerated and adhering to diabetic diet. Pt stated RA influences how much he can walk. Encouraged him to walk as he can. Stated he tried CRP 2 Kinsey but too much for him with his arthritis. Declined program. Pt seems determined to try to quit smoking.   Graylon Good, RN BSN  02/02/2015 8:18 AM

## 2015-02-02 NOTE — Discharge Summary (Signed)
Patient ID: Robert [name],  MRN: [id number], DOB/AGE: 13-Apr-1951 64 y.o.  Admit date: 02/01/2015 Discharge date: 02/02/2015  Primary Care Provider: Glo Herring., MD Primary Cardiologist: Dr Sallyanne Kuster  Discharge Diagnoses Principal Problem:   Chest pain with high risk of acute coronary syndrome Active Problems:   Abnormal nuclear stress test   S/P CFX PTCAI for ISR 02/01/15   Dyspnea   Type II IDDM   Contrast media allergy   ICM-EF 35% at cath 02/01/15   HTN (hypertension)   Tobacco abuse   PVD, chronic LLE   Rheumatoid arthritis   Dyslipidemia- LDL 45 09/01/13   COPD (chronic obstructive pulmonary disease)    Procedures:  Cath- CFX PTCA for ISR 02/01/15   Hospital Course:  64 y/o followed by Dr Sallyanne Kuster for CAD as described above. He has had multiple interventions, see his admission note for complete details. The pt had been having dyspnea and chest pain suspicious for angina. An OP Myoview was abnormal and he was admitted for an elective cath 02/01/15. He has a contrast allergy and he was pre medicated with steroids. This did cause his BS to be elevated and this will need to be followed up as an OP.This revealed ISR of the CFX. He was treated with PTCA- see Dr Thompson Caul cath note for details. The pt was seen by Dr Martinique 02/02/15 and felt to be stable for discharge. He has an appointment with Dr Sallyanne Kuster in July but should be seen in a week or two in the office by an APP.   Discharge Vitals:  Blood pressure 125/60, pulse 81, temperature 98 F (36.7 C), temperature source Oral, resp. rate 20, height 6' (1.829 m), weight 226 lb 10.1 oz (102.8 kg), SpO2 95 %.    Labs: Results for orders placed or performed during the hospital encounter of 02/01/15 (from the past 24 hour(s))  POCT Activated clotting time     Status: None   Collection Time: 02/01/15  9:19 AM  Result Value Ref Range   Activated Clotting Time 356 seconds  Glucose, capillary     Status: Abnormal   Collection  Time: 02/01/15 10:25 AM  Result Value Ref Range   Glucose-Capillary 333 (H) 65 - 99 mg/dL  Glucose, capillary     Status: Abnormal   Collection Time: 02/01/15  6:08 PM  Result Value Ref Range   Glucose-Capillary 411 (H) 65 - 99 mg/dL  Hemoglobin A1c     Status: Abnormal   Collection Time: 02/01/15  7:15 PM  Result Value Ref Range   Hgb A1c MFr Bld 12.4 (H) 4.8 - 5.6 %   Mean Plasma Glucose 309 mg/dL  Glucose, capillary     Status: Abnormal   Collection Time: 02/01/15  9:32 PM  Result Value Ref Range   Glucose-Capillary 383 (H) 65 - 99 mg/dL   Comment 1 Notify RN    Comment 2 Document in Chart   Basic metabolic panel     Status: Abnormal   Collection Time: 02/02/15  4:00 AM  Result Value Ref Range   Sodium 131 (L) 135 - 145 mmol/L   Potassium 3.7 3.5 - 5.1 mmol/L   Chloride 104 101 - 111 mmol/L   CO2 22 22 - 32 mmol/L   Glucose, Bld 275 (H) 65 - 99 mg/dL   BUN 17 6 - 20 mg/dL   Creatinine, Ser 0.93 0.61 - 1.24 mg/dL   Calcium 9.1 8.9 - 10.3 mg/dL   GFR calc non Af  Amer >60 >60 mL/min   GFR calc Af Amer >60 >60 mL/min   Anion gap 5 5 - 15  CBC     Status: Abnormal   Collection Time: 02/02/15  4:00 AM  Result Value Ref Range   WBC 13.2 (H) 4.0 - 10.5 K/uL   RBC 4.35 4.22 - 5.81 MIL/uL   Hemoglobin 13.4 13.0 - 17.0 g/dL   HCT 39.5 39.0 - 52.0 %   MCV 90.8 78.0 - 100.0 fL   MCH 30.8 26.0 - 34.0 pg   MCHC 33.9 30.0 - 36.0 g/dL   RDW 14.8 11.5 - 15.5 %   Platelets 191 150 - 400 K/uL  Glucose, capillary     Status: Abnormal   Collection Time: 02/02/15  6:28 AM  Result Value Ref Range   Glucose-Capillary 261 (H) 65 - 99 mg/dL   Comment 1 Notify RN    Comment 2 Document in Chart     Disposition:      Follow-up Information    Follow up with Sanda Klein, MD.   Specialty:  Cardiology   Why:  office will contact you   Contact information:   [address] [place] [address] [phone]       Discharge Medications:    Medication List    ASK  your doctor about these medications        acetaminophen 325 MG tablet  Commonly known as:  TYLENOL  Take 2 tablets (650 mg total) by mouth every 4 (four) hours as needed for headache or mild pain.     ALPRAZolam 0.5 MG tablet  Commonly known as:  XANAX  Take 0.5 mg by mouth 3 (three) times daily as needed. For anxiety     aspirin 81 MG EC tablet  Take 1 tablet (81 mg total) by mouth daily.     cetirizine 10 MG tablet  Commonly known as:  ZYRTEC  Take 10 mg by mouth daily as needed for allergies.     fish oil-omega-3 fatty acids 1000 MG capsule  Take 1 g by mouth daily.     folic acid 1 MG tablet  Commonly known as:  FOLVITE  Take 1 mg by mouth daily.     gemfibrozil 600 MG tablet  Commonly known as:  LOPID  Take 600 mg by mouth 2 (two) times daily before a meal.     glipiZIDE 10 MG 24 hr tablet  Commonly known as:  GLUCOTROL XL  Take 10 mg by mouth 2 (two) times daily.     hydroxychloroquine 200 MG tablet  Commonly known as:  PLAQUENIL  Take 200 mg by mouth 2 (two) times daily.     insulin glargine 100 UNIT/ML injection  Commonly known as:  LANTUS  Inject 80 Units into the skin at bedtime.     isosorbide mononitrate 60 MG 24 hr tablet  Commonly known as:  IMDUR  Take 1.5 tablets (90 mg total) by mouth daily.     metFORMIN 1000 MG tablet  Commonly known as:  GLUCOPHAGE  Take 1 tablet (1,000 mg total) by mouth 2 (two) times daily with a meal.     methotrexate 2.5 MG tablet  Commonly known as:  RHEUMATREX  Take 25 mg by mouth once a week. Caution:Chemotherapy. Protect from light. ON THURSDAYS     metoprolol tartrate 25 MG tablet  Commonly known as:  LOPRESSOR  TAKE 3 TABLETS TWICE DAILY     nitroGLYCERIN 0.4 MG SL tablet  Commonly  known as:  NITROSTAT  Place 1 tablet (0.4 mg total) under the tongue every 5 (five) minutes x 3 doses as needed for chest pain.     predniSONE 20 MG tablet  Commonly known as:  DELTASONE  Take 3 tablets (60 mg total) by mouth  once. Take the night before the cardiac cath     quinapril 40 MG tablet  Commonly known as:  ACCUPRIL  Take 0.5 tablets (20 mg total) by mouth at bedtime.     simvastatin 40 MG tablet  Commonly known as:  ZOCOR  Take 1 tablet (40 mg total) by mouth at bedtime.     sulfaSALAzine 500 MG tablet  Commonly known as:  AZULFIDINE  Take 500 mg by mouth 2 (two) times daily.     ticagrelor 90 MG Tabs tablet  Commonly known as:  BRILINTA  Take 1 tablet (90 mg total) by mouth 2 (two) times daily.         Duration of Discharge Encounter: Greater than 30 minutes including physician time.  Angelena Form PA-C 02/02/2015 9:06 AM

## 2015-02-10 ENCOUNTER — Encounter: Payer: Self-pay | Admitting: Physician Assistant

## 2015-02-10 ENCOUNTER — Ambulatory Visit (INDEPENDENT_AMBULATORY_CARE_PROVIDER_SITE_OTHER): Payer: Commercial Managed Care - HMO | Admitting: Physician Assistant

## 2015-02-10 VITALS — BP 110/64 | HR 89 | Ht 72.0 in | Wt 215.0 lb

## 2015-02-10 DIAGNOSIS — I10 Essential (primary) hypertension: Secondary | ICD-10-CM | POA: Diagnosis not present

## 2015-02-10 DIAGNOSIS — R079 Chest pain, unspecified: Secondary | ICD-10-CM | POA: Diagnosis not present

## 2015-02-10 DIAGNOSIS — E785 Hyperlipidemia, unspecified: Secondary | ICD-10-CM | POA: Diagnosis not present

## 2015-02-10 DIAGNOSIS — Z720 Tobacco use: Secondary | ICD-10-CM | POA: Diagnosis not present

## 2015-02-10 DIAGNOSIS — I255 Ischemic cardiomyopathy: Secondary | ICD-10-CM

## 2015-02-10 DIAGNOSIS — I2510 Atherosclerotic heart disease of native coronary artery without angina pectoris: Secondary | ICD-10-CM

## 2015-02-10 DIAGNOSIS — I2583 Coronary atherosclerosis due to lipid rich plaque: Secondary | ICD-10-CM

## 2015-02-10 MED ORDER — NITROGLYCERIN 0.4 MG SL SUBL: 0.4000 mg | SUBLINGUAL_TABLET | SUBLINGUAL | Status: DC | PRN

## 2015-02-10 MED ORDER — TICAGRELOR 90 MG PO TABS
90.0000 mg | ORAL_TABLET | Freq: Two times a day (BID) | ORAL | Status: DC
Start: 2015-02-10 — End: 2015-07-05

## 2015-02-10 NOTE — Patient Instructions (Signed)
Medication Instructions:   Your physician recommends that you continue on your current medications as directed. Please refer to the Current Medication list given to you today.   Labwork:  LIPIDS IN July   Testing/Procedures:   Follow-Up:   Any Other Special Instructions Will Be Listed Below (If Applicable).

## 2015-02-10 NOTE — Assessment & Plan Note (Signed)
Patient appears euvolemic. 

## 2015-02-10 NOTE — Progress Notes (Signed)
Patient ID: Robert [name], male   DOB: [birth year], 64 y.o.   MRN: [id number]    Date:  02/10/2015   ID:  Robert [name], DOB 1951/03/24, MRN [id number]  PCP:  Glo Herring., MD  Primary Cardiologist:  [facility]   Chief complaint:  Post hospital follow up    History of Present Illness: Robert [name] is a 64 y.o. male  followed by Dr Sallyanne Kuster for CAD. He has had multiple interventions, see his admission note for complete details. The pt had been having dyspnea and chest pain suspicious for angina. An OP Myoview was abnormal and he was admitted for an elective cath 02/01/15. He has a contrast allergy and he was pre medicated with steroids. This did cause his BS to be elevated and this will need to be followed up as an OP.This revealed ISR of the CFX which was treated with PTCA.  The patient presents for post hospital follow up.  He is taking his aspirin and Brilinta and has not missed any doses.  He reported some back pain on Monday afternoon for which he took nitroglycerin.  This provided no relief. He did take some Rolaids after and the pain resolved.  The patient currently denies nausea, vomiting, fever, chest pain, shortness of breath, orthopnea, dizziness, PND, cough, congestion, abdominal pain, hematochezia, melena, lower extremity edema, claudication.  His main complaint is that of fatigue and feeling tired all the time which is how he felt before his recent intervention. Rheumatoid arthritis is limiting his exercise ability.  I recommended swimming or water aerobics.  Wt Readings from Last 3 Encounters:  02/10/15 215 lb (97.523 kg)  02/02/15 226 lb 10.1 oz (102.8 kg)  01/07/15 225 lb (102.059 kg)     Past Medical History  Diagnosis Date  . Hypertension   . Coronary artery disease   . Dysrhythmia   . CHF (congestive heart failure)   . Unstable angina 12/22/2011  . PVD, chronic LLE 12/22/2011  . High cholesterol   . Chronic bronchitis     "maybe once/yr" (02/01/2015)  . Type II  diabetes mellitus   . Abnormal nuclear stress test 05/17/2012  . S/P angioplasty with stent of LCX 05/16/12 with expedition DES 05/17/2012  . Tobacco abuse   . 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 Shortness of breath 05/16/2012    "@ rest, lying down, w/exertion"  . Rheumatoid arthritis(714.0)     "qwhere on my body"  . Anxiety     Current Outpatient Prescriptions  Medication Sig Dispense Refill  . acetaminophen (TYLENOL) 325 MG tablet Take 2 tablets (650 mg total) by mouth every 4 (four) hours as needed for headache or mild pain.    [name] ALPRAZolam (XANAX) 0.5 MG tablet Take 0.5 mg by mouth 3 (three) times daily as needed. For anxiety    . aspirin EC 81 MG EC tablet Take 1 tablet (81 mg total) by mouth daily.    . cetirizine (ZYRTEC) 10 MG tablet Take 10 mg by mouth daily as needed for allergies.    . fish oil-omega-3 fatty acids 1000 MG capsule Take 1 g by mouth daily.     . folic acid (FOLVITE) 1 MG tablet Take 1 mg by mouth daily.     [name] gemfibrozil (LOPID) 600 MG tablet Take 600 mg by mouth 2 (two) times daily before a meal.     . glipiZIDE (GLUCOTROL  XL) 10 MG 24 hr tablet Take 10 mg by mouth 2 (two) times daily.    . hydroxychloroquine (PLAQUENIL) 200 MG tablet Take 200 mg by mouth 2 (two) times daily.    . insulin glargine (LANTUS) 100 UNIT/ML injection Inject 80 Units into the skin at bedtime.     . isosorbide mononitrate (IMDUR) 60 MG 24 hr tablet Take 1.5 tablets (90 mg total) by mouth daily. 135 tablet 3  . metFORMIN (GLUCOPHAGE) 1000 MG tablet Take 1 tablet (1,000 mg total) by mouth 2 (two) times daily with a meal.    . methotrexate (RHEUMATREX) 2.5 MG tablet Take 25 mg by mouth once a week. Caution:Chemotherapy. Protect from light. ON THURSDAYS    . metoprolol tartrate (LOPRESSOR) 25 MG tablet TAKE 3 TABLETS TWICE DAILY 540 tablet 3  . nitroGLYCERIN (NITROSTAT) 0.4 MG SL tablet Place 1 tablet (0.4 mg total) under the  tongue every 5 (five) minutes x 3 doses as needed for chest pain. 25 tablet 2  . quinapril (ACCUPRIL) 40 MG tablet Take 0.5 tablets (20 mg total) by mouth at bedtime. 45 tablet 2  . simvastatin (ZOCOR) 40 MG tablet Take 1 tablet (40 mg total) by mouth at bedtime. 90 tablet 2  . sulfaSALAzine (AZULFIDINE) 500 MG tablet Take 500 mg by mouth 2 (two) times daily.    . ticagrelor (BRILINTA) 90 MG TABS tablet Take 1 tablet (90 mg total) by mouth 2 (two) times daily. 60 tablet 6   No current facility-administered medications for this visit.    Allergies:    Allergies  Allergen Reactions  . Ivp Dye [Iodinated Diagnostic Agents] Hives and Itching  . Clopidogrel Bisulfate Rash  . Iodine Rash    Contrast Dye  . Plavix [Clopidogrel Bisulfate] Rash    Social History:  The patient  reports that he has been smoking Cigarettes.  He has a 30.75 pack-year smoking history. He has never used smokeless tobacco. He reports that he does not drink alcohol or use illicit drugs.   Family history:   Family History  Problem Relation Age of Onset  . Colon cancer Father   . Hypertension Mother   . Heart disease Mother   . Cancer Mother     ROS:  Please see the history of present illness.  All other systems reviewed and negative.   PHYSICAL EXAM: VS:  BP 110/64 mmHg  Pulse 89  Ht 6' (1.829 m)  Wt 215 lb (97.523 kg)  BMI 29.15 kg/m2 Well nourished, well developed, in no acute distress HEENT: Pupils are equal round react to light accommodation extraocular movements are intact. Conjunctiva are pink and well perfused. Neck: no JVDNo cervical lymphadenopathy. Cardiac: Regular rate and rhythm without murmurs rubs or gallops. Lungs:  clear to auscultation bilaterally, no wheezing, rhonchi or rales Abd: soft, nontender, positive bowel sounds all quadrants, no hepatosplenomegaly Ext: no lower extremity edema.  2+ radial and 1+ dorsalis pedis pulses. Skin: warm and dry.  Radial cath site healing well Neuro:   Grossly normal  EKG:  Normal sinus rhythm with inferior Q waves rate 89 bpm  ASSESSMENT AND PLAN:  Problem List Items Addressed This Visit    Tobacco abuse (Chronic)    He is trying to cut back. He does smoke a couple cigarettes periodically. We discussed additional ways to change his habits.      ICM-EF 35% at cath 02/01/15 - Primary    Patient appears euvolemic      Relevant Medications   nitroGLYCERIN (  NITROSTAT) 0.4 MG SL tablet   HTN (hypertension) (Chronic)    Well-controlled      Relevant Medications   nitroGLYCERIN (NITROSTAT) 0.4 MG SL tablet   Dyslipidemia- LDL 45 09/01/13 (Chronic)    LAST LIPID PANEL WAS 2014.  We will recheck prior to July apt.       Relevant Orders   Lipid Profile   EKG 12-Lead   Chest pain with high risk of acute coronary syndrome   CAD (coronary artery disease)    Patient is taking his aspirin and Brilinta.  He has not missed any doses.  She reported some back pain on Monday afternoon. He took nitroglycerin which provided no relief. He did take some Rolaids and the pain resolved. .      Relevant Medications   nitroGLYCERIN (NITROSTAT) 0.4 MG SL tablet

## 2015-02-10 NOTE — Assessment & Plan Note (Signed)
Well controlled 

## 2015-02-10 NOTE — Assessment & Plan Note (Signed)
He is trying to cut back. He does smoke a couple cigarettes periodically. We discussed additional ways to change his habits.

## 2015-02-10 NOTE — Assessment & Plan Note (Signed)
A1c was 12.4.  Asked patient to schedule appointment with primary care provider to address.

## 2015-02-10 NOTE — Assessment & Plan Note (Signed)
LAST LIPID PANEL WAS 2014.  We will recheck prior to July apt.

## 2015-02-10 NOTE — Assessment & Plan Note (Addendum)
Patient is taking his aspirin and Brilinta.  He has not missed any doses.  She reported some back pain on Monday afternoon. He took nitroglycerin which provided no relief. He did take some Rolaids and the pain resolved. [name]

## 2015-03-01 ENCOUNTER — Ambulatory Visit: Payer: Commercial Managed Care - HMO | Admitting: Cardiovascular Disease

## 2015-03-12 ENCOUNTER — Ambulatory Visit: Payer: Commercial Managed Care - HMO | Admitting: Cardiovascular Disease

## 2015-03-19 ENCOUNTER — Ambulatory Visit: Payer: Commercial Managed Care - HMO | Admitting: Cardiovascular Disease

## 2015-03-20 ENCOUNTER — Inpatient Hospital Stay (HOSPITAL_COMMUNITY)
Admission: EM | Admit: 2015-03-20 | Discharge: 2015-03-23 | DRG: 282 | Disposition: A | Discharge status: Home / Self Care | Payer: Commercial Managed Care - HMO | Attending: Cardiovascular Disease | Admitting: Cardiovascular Disease

## 2015-03-20 ENCOUNTER — Encounter (HOSPITAL_COMMUNITY): Payer: Self-pay | Admitting: Emergency Medicine

## 2015-03-20 ENCOUNTER — Emergency Department (HOSPITAL_COMMUNITY): Payer: Commercial Managed Care - HMO

## 2015-03-20 DIAGNOSIS — I200 Unstable angina: Secondary | ICD-10-CM | POA: Diagnosis present

## 2015-03-20 DIAGNOSIS — Z95820 Peripheral vascular angioplasty status with implants and grafts: Secondary | ICD-10-CM

## 2015-03-20 DIAGNOSIS — I255 Ischemic cardiomyopathy: Secondary | ICD-10-CM | POA: Diagnosis present

## 2015-03-20 DIAGNOSIS — I25110 Atherosclerotic heart disease of native coronary artery with unstable angina pectoris: Secondary | ICD-10-CM | POA: Diagnosis present

## 2015-03-20 DIAGNOSIS — Z7982 Long term (current) use of aspirin: Secondary | ICD-10-CM

## 2015-03-20 DIAGNOSIS — I10 Essential (primary) hypertension: Secondary | ICD-10-CM | POA: Diagnosis present

## 2015-03-20 DIAGNOSIS — I214 Non-ST elevation (NSTEMI) myocardial infarction: Secondary | ICD-10-CM | POA: Insufficient documentation

## 2015-03-20 DIAGNOSIS — I2510 Atherosclerotic heart disease of native coronary artery without angina pectoris: Secondary | ICD-10-CM | POA: Diagnosis not present

## 2015-03-20 DIAGNOSIS — Z91041 Radiographic dye allergy status: Secondary | ICD-10-CM

## 2015-03-20 DIAGNOSIS — R0602 Shortness of breath: Secondary | ICD-10-CM | POA: Diagnosis not present

## 2015-03-20 DIAGNOSIS — R079 Chest pain, unspecified: Secondary | ICD-10-CM

## 2015-03-20 DIAGNOSIS — Z720 Tobacco use: Secondary | ICD-10-CM

## 2015-03-20 DIAGNOSIS — Z794 Long term (current) use of insulin: Secondary | ICD-10-CM

## 2015-03-20 DIAGNOSIS — E1151 Type 2 diabetes mellitus with diabetic peripheral angiopathy without gangrene: Secondary | ICD-10-CM | POA: Diagnosis present

## 2015-03-20 DIAGNOSIS — I739 Peripheral vascular disease, unspecified: Secondary | ICD-10-CM | POA: Diagnosis present

## 2015-03-20 DIAGNOSIS — F17210 Nicotine dependence, cigarettes, uncomplicated: Secondary | ICD-10-CM | POA: Diagnosis present

## 2015-03-20 DIAGNOSIS — E1159 Type 2 diabetes mellitus with other circulatory complications: Secondary | ICD-10-CM | POA: Diagnosis present

## 2015-03-20 DIAGNOSIS — E119 Type 2 diabetes mellitus without complications: Secondary | ICD-10-CM

## 2015-03-20 DIAGNOSIS — M069 Rheumatoid arthritis, unspecified: Secondary | ICD-10-CM | POA: Diagnosis present

## 2015-03-20 DIAGNOSIS — R000 Tachycardia, unspecified: Secondary | ICD-10-CM | POA: Diagnosis present

## 2015-03-20 DIAGNOSIS — M719 Bursopathy, unspecified: Secondary | ICD-10-CM

## 2015-03-20 DIAGNOSIS — E785 Hyperlipidemia, unspecified: Secondary | ICD-10-CM | POA: Diagnosis not present

## 2015-03-20 DIAGNOSIS — M7550 Bursitis of unspecified shoulder: Secondary | ICD-10-CM | POA: Diagnosis not present

## 2015-03-20 DIAGNOSIS — R0789 Other chest pain: Secondary | ICD-10-CM | POA: Diagnosis not present

## 2015-03-20 DIAGNOSIS — Z955 Presence of coronary angioplasty implant and graft: Secondary | ICD-10-CM

## 2015-03-20 DIAGNOSIS — Z9889 Other specified postprocedural states: Secondary | ICD-10-CM

## 2015-03-20 DIAGNOSIS — E1165 Type 2 diabetes mellitus with hyperglycemia: Secondary | ICD-10-CM | POA: Diagnosis present

## 2015-03-20 DIAGNOSIS — M67919 Unspecified disorder of synovium and tendon, unspecified shoulder: Secondary | ICD-10-CM | POA: Diagnosis present

## 2015-03-20 DIAGNOSIS — Z9861 Coronary angioplasty status: Secondary | ICD-10-CM

## 2015-03-20 DIAGNOSIS — I252 Old myocardial infarction: Secondary | ICD-10-CM

## 2015-03-20 LAB — BASIC METABOLIC PANEL
Anion gap: 11 (ref 5–15)
BUN: 13 mg/dL (ref 6–20)
CHLORIDE: 104 mmol/L (ref 101–111)
CO2: 19 mmol/L — AB (ref 22–32)
Calcium: 9.2 mg/dL (ref 8.9–10.3)
Creatinine, Ser: 0.87 mg/dL (ref 0.61–1.24)
GFR calc Af Amer: >60 mL/min (ref >60)
GLUCOSE: 377 mg/dL — AB (ref 65–99)
POTASSIUM: 4.3 mmol/L (ref 3.5–5.1)
Sodium: 134 mmol/L — ABNORMAL LOW (ref 135–145)

## 2015-03-20 LAB — GLUCOSE, CAPILLARY
GLUCOSE-CAPILLARY: 398 mg/dL — AB (ref 65–99)
Glucose-Capillary: 213 mg/dL — ABNORMAL HIGH (ref 65–99)

## 2015-03-20 LAB — TROPONIN I
TROPONIN I: 7.44 ng/mL — AB (ref <0.031)
Troponin I: 4.81 ng/mL (ref <0.031)

## 2015-03-20 LAB — CBC
HEMATOCRIT: 41.5 % (ref 39.0–52.0)
Hemoglobin: 14.4 g/dL (ref 13.0–17.0)
MCH: 31.4 pg (ref 26.0–34.0)
MCHC: 34.7 g/dL (ref 30.0–36.0)
MCV: 90.6 fL (ref 78.0–100.0)
Platelets: 216 10*3/uL (ref 150–400)
RBC: 4.58 MIL/uL (ref 4.22–5.81)
RDW: 15.6 % — AB (ref 11.5–15.5)
WBC: 8 10*3/uL (ref 4.0–10.5)

## 2015-03-20 LAB — I-STAT TROPONIN, ED: Troponin i, poc: 0 ng/mL (ref 0.00–0.08)

## 2015-03-20 MED ORDER — METOPROLOL TARTRATE 25 MG PO TABS
25.0000 mg | ORAL_TABLET | Freq: Two times a day (BID) | ORAL | Status: DC
  Administered 2015-03-20 – 2015-03-23 (×6): 25 mg via ORAL
  Filled 2015-03-20 (×7): qty 1

## 2015-03-20 MED ORDER — SODIUM CHLORIDE 0.9 % IV SOLN: 250.0000 mL | INTRAVENOUS | Status: DC | PRN

## 2015-03-20 MED ORDER — ASPIRIN 300 MG RE SUPP: 300.0000 mg | RECTAL | Status: AC

## 2015-03-20 MED ORDER — HYDROXYCHLOROQUINE SULFATE 200 MG PO TABS
200.0000 mg | ORAL_TABLET | Freq: Two times a day (BID) | ORAL | Status: DC
  Administered 2015-03-20 – 2015-03-23 (×7): 200 mg via ORAL
  Filled 2015-03-20 (×7): qty 1

## 2015-03-20 MED ORDER — GLIPIZIDE ER 10 MG PO TB24
10.0000 mg | ORAL_TABLET | Freq: Two times a day (BID) | ORAL | Status: DC
  Administered 2015-03-20 – 2015-03-23 (×5): 10 mg via ORAL
  Filled 2015-03-20 (×8): qty 1

## 2015-03-20 MED ORDER — ISOSORBIDE MONONITRATE ER 60 MG PO TB24
120.0000 mg | ORAL_TABLET | Freq: Every day | ORAL | Status: DC
  Administered 2015-03-20 – 2015-03-23 (×4): 120 mg via ORAL
  Filled 2015-03-20 (×4): qty 2

## 2015-03-20 MED ORDER — ASPIRIN EC 81 MG PO TBEC
81.0000 mg | DELAYED_RELEASE_TABLET | Freq: Every day | ORAL | Status: DC
  Administered 2015-03-21 – 2015-03-23 (×2): 81 mg via ORAL
  Filled 2015-03-20 (×3): qty 1

## 2015-03-20 MED ORDER — ONDANSETRON HCL 4 MG/2ML IJ SOLN: 4.0000 mg | Freq: Four times a day (QID) | INTRAMUSCULAR | Status: DC | PRN

## 2015-03-20 MED ORDER — INSULIN GLARGINE 100 UNIT/ML ~~LOC~~ SOLN
50.0000 [IU] | Freq: Every day | SUBCUTANEOUS | Status: DC
  Administered 2015-03-20 – 2015-03-22 (×3): 50 [IU] via SUBCUTANEOUS
  Filled 2015-03-20 (×4): qty 0.5

## 2015-03-20 MED ORDER — ATORVASTATIN CALCIUM 80 MG PO TABS
80.0000 mg | ORAL_TABLET | Freq: Every day | ORAL | Status: DC
  Administered 2015-03-20 – 2015-03-22 (×3): 80 mg via ORAL
  Filled 2015-03-20 (×3): qty 1

## 2015-03-20 MED ORDER — INSULIN ASPART 100 UNIT/ML ~~LOC~~ SOLN
0.0000 [IU] | Freq: Three times a day (TID) | SUBCUTANEOUS | Status: DC
  Administered 2015-03-20: 15 [IU] via SUBCUTANEOUS
  Administered 2015-03-21: 5 [IU] via SUBCUTANEOUS
  Administered 2015-03-21: 3 [IU] via SUBCUTANEOUS
  Administered 2015-03-21: 8 [IU] via SUBCUTANEOUS

## 2015-03-20 MED ORDER — METHOTREXATE 2.5 MG PO TABS: 25.0000 mg | ORAL_TABLET | ORAL | Status: DC

## 2015-03-20 MED ORDER — ACETAMINOPHEN 325 MG PO TABS
650.0000 mg | ORAL_TABLET | ORAL | Status: DC | PRN
  Administered 2015-03-20: 650 mg via ORAL
  Filled 2015-03-20: qty 2

## 2015-03-20 MED ORDER — ENOXAPARIN SODIUM 100 MG/ML ~~LOC~~ SOLN
1.0000 mg/kg | Freq: Two times a day (BID) | SUBCUTANEOUS | Status: AC
  Administered 2015-03-20 – 2015-03-21 (×3): 100 mg via SUBCUTANEOUS
  Filled 2015-03-20 (×3): qty 1

## 2015-03-20 MED ORDER — GLIPIZIDE ER 10 MG PO TB24
10.0000 mg | ORAL_TABLET | Freq: Two times a day (BID) | ORAL | Status: DC
  Filled 2015-03-20: qty 1

## 2015-03-20 MED ORDER — QUINAPRIL HCL 10 MG PO TABS
20.0000 mg | ORAL_TABLET | Freq: Every day | ORAL | Status: DC
Start: 2015-03-20 — End: 2015-03-23
  Administered 2015-03-20 – 2015-03-22 (×3): 20 mg via ORAL
  Filled 2015-03-20 (×4): qty 2

## 2015-03-20 MED ORDER — SULFASALAZINE 500 MG PO TABS
500.0000 mg | ORAL_TABLET | Freq: Two times a day (BID) | ORAL | Status: DC
  Administered 2015-03-20 – 2015-03-23 (×6): 500 mg via ORAL
  Filled 2015-03-20 (×7): qty 1

## 2015-03-20 MED ORDER — SODIUM CHLORIDE 0.9 % IJ SOLN
3.0000 mL | Freq: Two times a day (BID) | INTRAMUSCULAR | Status: DC
  Administered 2015-03-20: 3 mL via INTRAVENOUS
  Administered 2015-03-22: 10 mL via INTRAVENOUS

## 2015-03-20 MED ORDER — SODIUM CHLORIDE 0.9 % IV SOLN
INTRAVENOUS | Status: DC
  Administered 2015-03-20 – 2015-03-21 (×3): via INTRAVENOUS

## 2015-03-20 MED ORDER — ALPRAZOLAM 0.5 MG PO TABS: 0.5000 mg | ORAL_TABLET | Freq: Three times a day (TID) | ORAL | Status: DC | PRN

## 2015-03-20 MED ORDER — INSULIN ASPART 100 UNIT/ML ~~LOC~~ SOLN: 0.0000 [IU] | Freq: Three times a day (TID) | SUBCUTANEOUS | Status: DC

## 2015-03-20 MED ORDER — ASPIRIN 81 MG PO CHEW: 324.0000 mg | CHEWABLE_TABLET | ORAL | Status: AC

## 2015-03-20 MED ORDER — MORPHINE SULFATE 4 MG/ML IJ SOLN
4.0000 mg | Freq: Once | INTRAMUSCULAR | Status: AC
  Administered 2015-03-20: 4 mg via INTRAVENOUS
  Filled 2015-03-20: qty 1

## 2015-03-20 MED ORDER — NITROGLYCERIN IN D5W 200-5 MCG/ML-% IV SOLN
0.0000 ug/min | Freq: Once | INTRAVENOUS | Status: AC
  Administered 2015-03-20: 5 ug/min via INTRAVENOUS
  Filled 2015-03-20: qty 250

## 2015-03-20 MED ORDER — TICAGRELOR 90 MG PO TABS
90.0000 mg | ORAL_TABLET | Freq: Two times a day (BID) | ORAL | Status: DC
Start: 2015-03-20 — End: 2015-03-23
  Administered 2015-03-20 – 2015-03-23 (×7): 90 mg via ORAL
  Filled 2015-03-20 (×7): qty 1

## 2015-03-20 MED ORDER — SODIUM CHLORIDE 0.9 % IJ SOLN: 3.0000 mL | INTRAMUSCULAR | Status: DC | PRN

## 2015-03-20 MED ORDER — NITROGLYCERIN 0.4 MG SL SUBL: 0.4000 mg | SUBLINGUAL_TABLET | SUBLINGUAL | Status: DC | PRN

## 2015-03-20 NOTE — ED Notes (Signed)
Placed pt on 2L O2 nasal cannula due to pt O2 sats dipping into high 80's.

## 2015-03-20 NOTE — ED Notes (Addendum)
Remains chest pain free.

## 2015-03-20 NOTE — ED Notes (Signed)
Pt chest pain free at this time. Nitro drip @ 25 mcg/min. Resting comfortably. Wife at bedside.

## 2015-03-20 NOTE — ED Notes (Signed)
Pt to ED via [facility] - per EMS- pt had new onset left upper chest pain this morning associated with shortness of breath. Pt took 2 nitro at home with no relief. EMS gave 324 aspirin. Pt has cardiac hx of 2 MI's with 5 stents placed; 5 weeks ago had "clean out". Pain 8/10. ST on monitor @ 110 bpm. 18G LAC. A/Ox4.

## 2015-03-20 NOTE — Consult Note (Addendum)
History and physical  Chief complaint chest pain  Admit date: 03/20/2015 Referring Physician  Dr. Doy Mince Primary Physician Glo Herring., MD Primary Cardiologist  CROITORU,MIHAI, MD  Reason for Consultation  Chest pain  HPI: 64 year old man with coronary artery disease status post multiple interventions who was admitted last for elective cardiac catheterization on 02/01/15 after abnormal Myoview with contrast allergy premedicated with steroid-induced which revealed in-stent restenosis of circumflex artery which was treated with PTCA here with chest pain.  Has underlying ischemic cardiomyopathy with ejection fraction of 35%, hypertension, dyslipidemia, diabetes, tobacco use.  Hemoglobin A1c was 12.4.  Earlier today he developed upper left-sided chest pain fairly constant for 3 hours which began after driving to communicate with family member who is helping with the process of moving. Earlier, he had moved a very heavy gun case at home. His wife carries around nitroglycerin for him and he tried 2 nitroglycerin's without much relief. He was given another nitroglycerin in the EMS truck. He is felt fairly poor over the last few days perhaps secondary to his rheumatoid arthritis. No nausea. Troponin is negative. Mild shortness of breath.   PMH:   Past Medical History  Diagnosis Date  . Hypertension   . Coronary artery disease   . Dysrhythmia   . CHF (congestive heart failure)   . Unstable angina 12/22/2011  . PVD, chronic LLE 12/22/2011  . High cholesterol   . Chronic bronchitis     "maybe once/yr" (02/01/2015)  . Type II diabetes mellitus   . Abnormal nuclear stress test 05/17/2012  . S/P angioplasty with stent of LCX 05/16/12 with expedition DES 05/17/2012  . Tobacco abuse   . 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 Shortness of breath 05/16/2012    "@ rest, lying down, w/exertion"  . Rheumatoid arthritis(714.0)     "qwhere  on my body"  . Anxiety     PSH:   Past Surgical History  Procedure Laterality Date  . Back surgery    . Nm myocar perf wall motion  08/30/2009    No significant ischemia  . Left heart catheterization with coronary angiogram N/A 12/22/2011    Procedure: LEFT HEART CATHETERIZATION WITH CORONARY ANGIOGRAM;  Surgeon: Troy Sine, MD;  Location: [facility] CATH LAB;  Service: Cardiovascular;  Laterality: N/A;  . Percutaneous coronary intervention-balloon only  12/22/2011    Procedure: PERCUTANEOUS CORONARY INTERVENTION-BALLOON ONLY;  Surgeon: Troy Sine, MD;  Location: [facility] CATH LAB;  Service: Cardiovascular;;  . Left heart catheterization with coronary angiogram Bilateral 05/16/2012    Procedure: LEFT HEART CATHETERIZATION WITH CORONARY ANGIOGRAM;  Surgeon: Lorretta Harp, MD;  Location: [facility] CATH LAB;  Service: Cardiovascular;  Laterality: Bilateral;  . Percutaneous coronary stent intervention (pci-s)  05/16/2012    Procedure: PERCUTANEOUS CORONARY STENT INTERVENTION (PCI-S);  Surgeon: Lorretta Harp, MD;  Location: [facility] CATH LAB;  Service: Cardiovascular;;  . Left heart catheterization with coronary angiogram N/A 11/14/2012    Procedure: LEFT HEART CATHETERIZATION WITH CORONARY ANGIOGRAM;  Surgeon: Troy Sine, MD;  Location: [facility] CATH LAB;  Service: Cardiovascular;  Laterality: N/A;  . Percutaneous coronary intervention-balloon only  11/14/2012    Procedure: PERCUTANEOUS CORONARY INTERVENTION-BALLOON ONLY;  Surgeon: Troy Sine, MD;  Location: [facility] CATH LAB;  Service: Cardiovascular;;  . Left heart catheterization with coronary angiogram N/A 09/01/2013    Procedure: LEFT HEART CATHETERIZATION WITH CORONARY ANGIOGRAM;  Surgeon: Leonie Man, MD;  Location: [place] CATH LAB;  Service: Cardiovascular;  Laterality: N/A;  . Percutaneous coronary stent intervention (pci-s)  09/01/2013    Procedure: PERCUTANEOUS CORONARY STENT INTERVENTION (PCI-S);  Surgeon: Leonie Man, MD;  Location: [facility] CATH LAB;  Service:  Cardiovascular;;  . Coronary angioplasty  11/14/2012; 02/01/2015  . Coronary angioplasty with stent placement  05/16/2012    "1; makes total ~ 4"  . Laparoscopic cholecystectomy  ~1995  . Lumbar laminectomy  1972; 1985  . Cardiac catheterization N/A 02/01/2015    Procedure: Left Heart Cath and Coronary Angiography;  Surgeon: Belva Crome, MD;  Location: [place] CV LAB;  Service: Cardiovascular;  Laterality: N/A;   Allergies:  Ivp dye; Clopidogrel bisulfate; Iodine; and Plavix Prior to Admit Meds:   Prior to Admission medications   Medication Sig Start Date End Date Taking? Authorizing Provider  acetaminophen (TYLENOL) 325 MG tablet Take 2 tablets (650 mg total) by mouth every 4 (four) hours as needed for headache or mild pain. 09/03/13  Yes Luke K Kilroy, PA-C  ALPRAZolam Duanne Moron) 0.5 MG tablet Take 0.5 mg by mouth 3 (three) times daily as needed. For anxiety   Yes Historical Provider, MD  aspirin EC 81 MG EC tablet Take 1 tablet (81 mg total) by mouth daily. 11/15/12  Yes Brett Canales, PA-C  cetirizine (ZYRTEC) 10 MG tablet Take 10 mg by mouth daily as needed for allergies.   Yes Historical Provider, MD  fish oil-omega-3 fatty acids 1000 MG capsule Take 1 g by mouth daily.    Yes Historical Provider, MD  folic acid (FOLVITE) 1 MG tablet Take 1 mg by mouth daily.    Yes Historical Provider, MD  gemfibrozil (LOPID) 600 MG tablet Take 600 mg by mouth 2 (two) times daily before a meal.  09/23/13  Yes Historical Provider, MD  glipiZIDE (GLUCOTROL XL) 10 MG 24 hr tablet Take 10 mg by mouth 2 (two) times daily.   Yes Historical Provider, MD  hydroxychloroquine (PLAQUENIL) 200 MG tablet Take 200 mg by mouth 2 (two) times daily.   Yes Historical Provider, MD  insulin glargine (LANTUS) 100 UNIT/ML injection Inject 50 Units into the skin at bedtime.    Yes Historical Provider, MD  isosorbide mononitrate (IMDUR) 60 MG 24 hr tablet Take 1.5 tablets (90 mg total) by mouth daily. 05/25/14  Yes Mihai Croitoru,  MD  metFORMIN (GLUCOPHAGE) 1000 MG tablet Take 1 tablet (1,000 mg total) by mouth 2 (two) times daily with a meal. 02/05/15  Yes Doreene Burke Kilroy, PA-C  metoprolol tartrate (LOPRESSOR) 25 MG tablet TAKE 3 TABLETS TWICE DAILY 01/02/14  Yes Mihai Croitoru, MD  nitroGLYCERIN (NITROSTAT) 0.4 MG SL tablet Place 1 tablet (0.4 mg total) under the tongue every 5 (five) minutes x 3 doses as needed for chest pain. 02/10/15  Yes Brett Canales, PA-C  quinapril (ACCUPRIL) 40 MG tablet Take 0.5 tablets (20 mg total) by mouth at bedtime. 09/15/14  Yes Mihai Croitoru, MD  simvastatin (ZOCOR) 40 MG tablet Take 1 tablet (40 mg total) by mouth at bedtime. 10/07/13  Yes Mihai Croitoru, MD  sulfaSALAzine (AZULFIDINE) 500 MG tablet Take 500 mg by mouth 2 (two) times daily.   Yes Historical Provider, MD  ticagrelor (BRILINTA) 90 MG TABS tablet Take 1 tablet (90 mg total) by mouth 2 (two) times daily. 02/10/15  Yes Brett Canales, PA-C  traMADol (ULTRAM) 50 MG tablet Take 50 mg by mouth every 6 (six) hours as needed for moderate pain.  Yes Historical Provider, MD  methotrexate (RHEUMATREX) 2.5 MG tablet Take 25 mg by mouth once a week. Caution:Chemotherapy. Protect from light. ON THURSDAYS    Historical Provider, MD   Fam HX:    Family History  Problem Relation Age of Onset  . Colon cancer Father   . Hypertension Mother   . Heart disease Mother   . Cancer Mother    Social HX:    History   Social History  . Marital Status: Married    Spouse Name: N/A  . Number of Children: N/A  . Years of Education: N/A   Occupational History  . tractor Recruitment consultant    Social History Main Topics  . Smoking status: Current Every Day Smoker -- 0.75 packs/day for 41 years    Types: Cigarettes  . Smokeless tobacco: Never Used  . Alcohol Use: No  . Drug Use: No  . Sexual Activity: Yes   Other Topics Concern  . Not on file   Social History Narrative     ROS:  Denies any bleeding, syncope, orthopnea, PND All 11 ROS were  addressed and are negative except what is stated in the HPI   Physical Exam: Blood pressure 117/70, pulse 91, temperature 98 F (36.7 C), temperature source Oral, resp. rate 21, SpO2 95 %.   General: Well developed, well nourished, in no acute distress Head: Eyes PERRLA, No xanthomas.   Normal cephalic and atramatic  Lungs:   Clear bilaterally to auscultation and percussion. Normal respiratory effort. No wheezes, no rales. Heart:   HRRR S1 S2 Pulses are 2+ & equal. No murmur, rubs, gallops.  No carotid bruit. No JVD.  No abdominal bruits.  Abdomen: Bowel sounds are positive, abdomen soft and non-tender without masses. No hepatosplenomegaly. Msk:  Back normal. Normal strength and tone for age. Extremities:  No clubbing, cyanosis or edema.  DP +1 Neuro: Alert and oriented X 3, non-focal, MAE x 4 GU: Deferred Rectal: Deferred Psych:  Good affect, responds appropriately      Labs: Lab Results  Component Value Date   WBC 8.0 03/20/2015   HGB 14.4 03/20/2015   HCT 41.5 03/20/2015   MCV 90.6 03/20/2015   PLT 216 03/20/2015     Recent Labs Lab 03/20/15 1020  NA 134*  K 4.3  CL 104  CO2 19*  BUN 13  CREATININE 0.87  CALCIUM 9.2  GLUCOSE 377*   No results for input(s): CKTOTAL, CKMB, TROPONINI in the last 72 hours. Lab Results  Component Value Date   CHOL 138 09/02/2013   HDL 29* 09/02/2013   LDLCALC 45 09/02/2013   TRIG 322* 09/02/2013   No results found for: DDIMER   Radiology:  Dg Chest Port 1 View  03/20/2015   CLINICAL DATA:  Left-sided chest pain.  Shortness of breath.  EXAM: PORTABLE CHEST - 1 VIEW  COMPARISON:  01/07/2015  FINDINGS: The heart size and mediastinal contours are within normal limits. Linear opacity in both lung bases shows no significant change in may be due to scarring or atelectasis. No evidence of pulmonary consolidation or edema. No evidence of pleural effusion.  IMPRESSION: No significant change in bibasilar scarring versus atelectasis. No acute  findings.   Electronically Signed   By: Earle Gell M.D.   On: 03/20/2015 11:22   Personally viewed.  EKG:  03/20/15-sinus rhythm, old inferior infarct pattern, borderline prolonged QT interval Personally viewed. No significant change from prior  ASSESSMENT/PLAN:    64 year old male with coronary artery disease,  multiple interventions with last cardiac catheterization 02/01/15, PTCA of circumflex in-stent restenosis here with chest pain.  1. Chest pain-significant native coronary artery disease. Total occlusion of RCA, in-stent restenosis of proximal to mid circumflex and 50% proximal to mid LAD with successful scoring balloon angioplasty of in-stent restenosis and the circumflex from 85% to less than 30% with final balloon diameter of 4 mm.  Currently troponin is normal, EKG unchanged. His chest pain has finally subsided after administration of IV nitroglycerin. Continue to wean IV nitroglycerin.  Plan will be to administer ACS dose Lovenox, continue to cycle cardiac biomarkers/troponin.  I will increase Imdur to 120 mg once a day. He is currently taking 90. Continue with beta blocker.  Could this be musculoskeletal? He is currently in the process of moving and has recently moved a very heavy gun case.  If troponins are normal and his chest pain resolves with use of medications, optimally, I would like to see how he does over the next several weeks with exercise and medical management. If angina continues, repeat cardiac catheterization.  2. Uncontrolled diabetes-continue with Lantus 50 units with insulin sliding scale. He is working with a primary physician on this.  3. Hyperlipidemia - I will stop gemfibrozil. Appeared to be on both simvastatin and gemfibrozil combination. LDL goal 70. Instead of simvastatin, I will place him on atorvastatin 80 mg.  4. Ischemic cardiomyopathy-ejection fraction 35% on catheterization. Beta blocker, ACE inhibitor. Echo EF on 01/11/15 was 50-55% with severe  hypokinesis of the inferior lateral myocardium. Nuclear EF was 47%. Does not appear to be fluid overloaded.  5. Tobacco use-cessation.  Candee Furbish, MD  03/20/2015  1:25 PM  Adden: Troponin elevated. +ACS, NSTEMI. Plan cath Monday.   Candee Furbish, MD

## 2015-03-20 NOTE — ED Notes (Signed)
[place] with admitting MD for pt to have PO food/fluid.

## 2015-03-20 NOTE — ED Provider Notes (Signed)
CSN: [id number]     Arrival date & time 03/20/15  1013 History   First MD Initiated Contact with Patient 03/20/15 1018     Chief Complaint  Patient presents with  . Chest Pain     (Consider location/radiation/quality/duration/timing/severity/associated sxs/prior Treatment) Patient is a 64 y.o. male presenting with chest pain.  Chest Pain Pain location:  L chest Pain quality comment:  Unable to describe, just "hurts" Pain radiates to:  Does not radiate Pain severity:  Moderate Onset quality:  Gradual Duration:  3 hours Timing:  Constant Progression:  Worsening Chronicity:  New Context comment:  Began after waking up today.  Also reports not feeling well for past few days - attributed this to his RA.  Relieved by:  Nothing Worsened by:  Nothing tried Ineffective treatments:  None tried Associated symptoms: diaphoresis and shortness of breath   Associated symptoms: no nausea and not vomiting     Past Medical History  Diagnosis Date  . Hypertension   . Coronary artery disease   . Dysrhythmia   . CHF (congestive heart failure)   . Unstable angina 12/22/2011  . PVD, chronic LLE 12/22/2011  . High cholesterol   . Chronic bronchitis     "maybe once/yr" (02/01/2015)  . Type II diabetes mellitus   . Abnormal nuclear stress test 05/17/2012  . S/P angioplasty with stent of LCX 05/16/12 with expedition DES 05/17/2012  . Tobacco abuse   . 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 Shortness of breath 05/16/2012    "@ rest, lying down, w/exertion"  . Rheumatoid arthritis(714.0)     "qwhere on my body"  . Anxiety    Past Surgical History  Procedure Laterality Date  . Back surgery    . Nm myocar perf wall motion  08/30/2009    No significant ischemia  . Left heart catheterization with coronary angiogram N/A 12/22/2011    Procedure: LEFT HEART CATHETERIZATION WITH CORONARY ANGIOGRAM;  Surgeon: Troy Sine, MD;  Location: [facility]  CATH LAB;  Service: Cardiovascular;  Laterality: N/A;  . Percutaneous coronary intervention-balloon only  12/22/2011    Procedure: PERCUTANEOUS CORONARY INTERVENTION-BALLOON ONLY;  Surgeon: Troy Sine, MD;  Location: [facility] CATH LAB;  Service: Cardiovascular;;  . Left heart catheterization with coronary angiogram Bilateral 05/16/2012    Procedure: LEFT HEART CATHETERIZATION WITH CORONARY ANGIOGRAM;  Surgeon: Lorretta Harp, MD;  Location: [facility] CATH LAB;  Service: Cardiovascular;  Laterality: Bilateral;  . Percutaneous coronary stent intervention (pci-s)  05/16/2012    Procedure: PERCUTANEOUS CORONARY STENT INTERVENTION (PCI-S);  Surgeon: Lorretta Harp, MD;  Location: [facility] CATH LAB;  Service: Cardiovascular;;  . Left heart catheterization with coronary angiogram N/A 11/14/2012    Procedure: LEFT HEART CATHETERIZATION WITH CORONARY ANGIOGRAM;  Surgeon: Troy Sine, MD;  Location: [facility] CATH LAB;  Service: Cardiovascular;  Laterality: N/A;  . Percutaneous coronary intervention-balloon only  11/14/2012    Procedure: PERCUTANEOUS CORONARY INTERVENTION-BALLOON ONLY;  Surgeon: Troy Sine, MD;  Location: [facility] CATH LAB;  Service: Cardiovascular;;  . Left heart catheterization with coronary angiogram N/A 09/01/2013    Procedure: LEFT HEART CATHETERIZATION WITH CORONARY ANGIOGRAM;  Surgeon: Leonie Man, MD;  Location: [facility] CATH LAB;  Service: Cardiovascular;  Laterality: N/A;  . Percutaneous coronary stent intervention (pci-s)  09/01/2013    Procedure: PERCUTANEOUS CORONARY STENT INTERVENTION (PCI-S);  Surgeon: Leonie Man, MD;  Location: [facility] CATH LAB;  Service: Cardiovascular;;  . Coronary angioplasty  11/14/2012; 02/01/2015  . Coronary angioplasty with stent placement  05/16/2012    "1; makes total ~ 4"  . Laparoscopic cholecystectomy  ~1995  . Lumbar laminectomy  1972; 1985  . Cardiac catheterization N/A 02/01/2015    Procedure: Left Heart Cath and Coronary Angiography;  Surgeon: Belva Crome, MD;  Location:  [place] CV LAB;  Service: Cardiovascular;  Laterality: N/A;   Family History  Problem Relation Age of Onset  . Colon cancer Father   . Hypertension Mother   . Heart disease Mother   . Cancer Mother    History  Substance Use Topics  . Smoking status: Current Every Day Smoker -- 0.75 packs/day for 41 years    Types: Cigarettes  . Smokeless tobacco: Never Used  . Alcohol Use: No    Review of Systems  Constitutional: Positive for diaphoresis.  Respiratory: Positive for shortness of breath.   Cardiovascular: Positive for chest pain.  Gastrointestinal: Negative for nausea and vomiting.  All other systems reviewed and are negative.     Allergies  Ivp dye; Clopidogrel bisulfate; Iodine; and Plavix  Home Medications   Prior to Admission medications   Medication Sig Start Date End Date Taking? Authorizing Provider  acetaminophen (TYLENOL) 325 MG tablet Take 2 tablets (650 mg total) by mouth every 4 (four) hours as needed for headache or mild pain. 09/03/13  Yes Luke K Kilroy, PA-C  ALPRAZolam Duanne Moron) 0.5 MG tablet Take 0.5 mg by mouth 3 (three) times daily as needed. For anxiety   Yes Historical Provider, MD  aspirin EC 81 MG EC tablet Take 1 tablet (81 mg total) by mouth daily. 11/15/12  Yes Brett Canales, PA-C  cetirizine (ZYRTEC) 10 MG tablet Take 10 mg by mouth daily as needed for allergies.   Yes Historical Provider, MD  fish oil-omega-3 fatty acids 1000 MG capsule Take 1 g by mouth daily.    Yes Historical Provider, MD  folic acid (FOLVITE) 1 MG tablet Take 1 mg by mouth daily.    Yes Historical Provider, MD  gemfibrozil (LOPID) 600 MG tablet Take 600 mg by mouth 2 (two) times daily before a meal.  09/23/13  Yes Historical Provider, MD  glipiZIDE (GLUCOTROL XL) 10 MG 24 hr tablet Take 10 mg by mouth 2 (two) times daily.   Yes Historical Provider, MD  hydroxychloroquine (PLAQUENIL) 200 MG tablet Take 200 mg by mouth 2 (two) times daily.   Yes Historical Provider, MD  insulin  glargine (LANTUS) 100 UNIT/ML injection Inject 50 Units into the skin at bedtime.    Yes Historical Provider, MD  isosorbide mononitrate (IMDUR) 60 MG 24 hr tablet Take 1.5 tablets (90 mg total) by mouth daily. 05/25/14  Yes Mihai Croitoru, MD  metFORMIN (GLUCOPHAGE) 1000 MG tablet Take 1 tablet (1,000 mg total) by mouth 2 (two) times daily with a meal. 02/05/15  Yes Doreene Burke Kilroy, PA-C  metoprolol tartrate (LOPRESSOR) 25 MG tablet TAKE 3 TABLETS TWICE DAILY 01/02/14  Yes Mihai Croitoru, MD  nitroGLYCERIN (NITROSTAT) 0.4 MG SL tablet Place 1 tablet (0.4 mg total) under the tongue every 5 (five) minutes x 3 doses as needed for chest pain. 02/10/15  Yes Brett Canales, PA-C  quinapril (ACCUPRIL) 40 MG tablet Take 0.5 tablets (20 mg total) by mouth at bedtime. 09/15/14  Yes Mihai Croitoru, MD  simvastatin (ZOCOR) 40 MG tablet Take 1 tablet (40 mg total) by mouth at bedtime. 10/07/13  Yes Mihai Croitoru,  MD  sulfaSALAzine (AZULFIDINE) 500 MG tablet Take 500 mg by mouth 2 (two) times daily.   Yes Historical Provider, MD  ticagrelor (BRILINTA) 90 MG TABS tablet Take 1 tablet (90 mg total) by mouth 2 (two) times daily. 02/10/15  Yes Brett Canales, PA-C  traMADol (ULTRAM) 50 MG tablet Take 50 mg by mouth every 6 (six) hours as needed for moderate pain.   Yes Historical Provider, MD  methotrexate (RHEUMATREX) 2.5 MG tablet Take 25 mg by mouth once a week. Caution:Chemotherapy. Protect from light. ON THURSDAYS    Historical Provider, MD   BP 137/75 mmHg  Pulse 95  Temp(Src) 98 F (36.7 C) (Oral)  Resp 24  SpO2 92% Physical Exam  Constitutional: He is oriented to person, place, and time. He appears well-developed and well-nourished. No distress.  Uncomfortable appearance  HENT:  Head: Normocephalic and atraumatic.  Mouth/Throat: Oropharynx is clear and moist.  Eyes: Conjunctivae are normal. Pupils are equal, round, and reactive to light. No scleral icterus.  Neck: Neck supple.  Cardiovascular: Normal rate,  regular rhythm, normal heart sounds and intact distal pulses.   No murmur heard. Pulmonary/Chest: Effort normal and breath sounds normal. No stridor. No respiratory distress. He has no wheezes. He has no rales.  Bibasilar crackles  Abdominal: Soft. He exhibits no distension. There is no tenderness.  Musculoskeletal: Normal range of motion. He exhibits no edema.  Neurological: He is alert and oriented to person, place, and time.  Skin: Skin is warm and dry. No rash noted. He is not diaphoretic.  Psychiatric: He has a normal mood and affect. His behavior is normal.  Nursing note and vitals reviewed.   ED Course  Procedures (including critical care time) Labs Review Labs Reviewed  CBC - Abnormal; Notable for the following:    RDW 15.6 (*)    All other components within normal limits  BASIC METABOLIC PANEL - Abnormal; Notable for the following:    Sodium 134 (*)    CO2 19 (*)    Glucose, Bld 377 (*)    All other components within normal limits  TROPONIN I  TROPONIN I  TROPONIN I  Randolm Idol, ED    Imaging Review Dg Chest Port 1 View  03/20/2015   CLINICAL DATA:  Left-sided chest pain.  Shortness of breath.  EXAM: PORTABLE CHEST - 1 VIEW  COMPARISON:  01/07/2015  FINDINGS: The heart size and mediastinal contours are within normal limits. Linear opacity in both lung bases shows no significant change in may be due to scarring or atelectasis. No evidence of pulmonary consolidation or edema. No evidence of pleural effusion.  IMPRESSION: No significant change in bibasilar scarring versus atelectasis. No acute findings.   Electronically Signed   By: Earle Gell M.D.   On: 03/20/2015 11:22     EKG Interpretation   Date/Time:  Saturday March 20 2015 10:17:53 EDT Ventricular Rate:  101 PR Interval:  149 QRS Duration: 96 QT Interval:  371 QTC Calculation: 481 R Axis:   -33 Text Interpretation:  Sinus tachycardia Left axis deviation Borderline low  voltage, extremity leads  Borderline prolonged QT interval Confirmed by  [facility]  MD, TREY ([id number]) on 03/20/2015 10:40:13 AM      MDM   Final diagnoses:  Chest pain, unspecified chest pain type    64 yo male with hx of severe CAD presenting with chest pain and SOB.  Nitro helped a little by EMS.  Got ASA 324 by EMS. EKG without acute ischemia.  Initial troponin negative.    Admitted by cardiology  Serita Grit, MD 03/20/15 1550

## 2015-03-20 NOTE — ED Notes (Signed)
Pt also received 2 nitro in route by EMS - also with no relief. CBG per EMS 363. Pt is diabetic.

## 2015-03-20 NOTE — Progress Notes (Signed)
ANTICOAGULATION CONSULT NOTE - Initial Consult  Pharmacy Consult for lovenox Indication: chest pain/ACS  Allergies  Allergen Reactions  . Ivp Dye [Iodinated Diagnostic Agents] Hives and Itching  . Clopidogrel Bisulfate Rash  . Iodine Rash    Contrast Dye  . Plavix [Clopidogrel Bisulfate] Rash    Patient Measurements: Height: 6' (182.9 cm) Weight: 218 lb 1.6 oz (98.93 kg) IBW/kg (Calculated) : 77.6 Heparin Dosing Weight:   Vital Signs: Temp: 98.6 F (37 C) (07/09 1509) Temp Source: Oral (07/09 1509) BP: 136/78 mmHg (07/09 1509) Pulse Rate: 94 (07/09 1415)  Labs:  Recent Labs  03/20/15 1020  HGB 14.4  HCT 41.5  PLT 216  CREATININE 0.87    Estimated Creatinine Clearance: 108.6 mL/min (by C-G formula based on Cr of 0.87).   Medical History: Past Medical History  Diagnosis Date  . Hypertension   . Coronary artery disease   . Dysrhythmia   . CHF (congestive heart failure)   . Unstable angina 12/22/2011  . PVD, chronic LLE 12/22/2011  . High cholesterol   . Chronic bronchitis     "maybe once/yr" (02/01/2015)  . Type II diabetes mellitus   . Abnormal nuclear stress test 05/17/2012  . S/P angioplasty with stent of LCX 05/16/12 with expedition DES 05/17/2012  . Tobacco abuse   . 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 Shortness of breath 05/16/2012    "@ rest, lying down, w/exertion"  . Rheumatoid arthritis(714.0)     "qwhere on my body"  . Anxiety     Medications:  Scheduled:  . aspirin  324 mg Oral NOW   Or  . aspirin  300 mg Rectal NOW  . aspirin EC  81 mg Oral Daily  . atorvastatin  80 mg Oral q1800  . glipiZIDE  10 mg Oral BID  . hydroxychloroquine  200 mg Oral BID  . insulin glargine  50 Units Subcutaneous QHS  . isosorbide mononitrate  120 mg Oral Daily  . methotrexate  25 mg Oral Weekly  . metoprolol tartrate  25 mg Oral BID  . quinapril  20 mg Oral QHS  . sodium chloride  3 mL  Intravenous Q12H  . sulfaSALAzine  500 mg Oral BID  . ticagrelor  90 mg Oral BID   Infusions:  . sodium chloride      Assessment: 64 yo male with ACS will be started on lovenox.  Not on anticoagulation prior to admission.  CrCl >100 Goal of Therapy:  Anti-Xa level 0.6-1 units/ml 4hrs after LMWH dose given Monitor platelets by anticoagulation protocol: Yes   Plan:  - lovenox 100 mg sq q12h - CBC every 72 hours  Taiquan Campanaro, Tsz-Yin 03/20/2015,3:18 PM

## 2015-03-21 ENCOUNTER — Encounter (HOSPITAL_COMMUNITY): Payer: Self-pay | Admitting: Cardiology

## 2015-03-21 LAB — GLUCOSE, CAPILLARY
GLUCOSE-CAPILLARY: 266 mg/dL — AB (ref 65–99)
GLUCOSE-CAPILLARY: 191 mg/dL — AB (ref 65–99)
Glucose-Capillary: 213 mg/dL — ABNORMAL HIGH (ref 65–99)
Glucose-Capillary: 190 mg/dL — ABNORMAL HIGH (ref 65–99)

## 2015-03-21 LAB — BASIC METABOLIC PANEL
ANION GAP: 6 (ref 5–15)
BUN: 11 mg/dL (ref 6–20)
CALCIUM: 8.8 mg/dL — AB (ref 8.9–10.3)
CO2: 23 mmol/L (ref 22–32)
Chloride: 104 mmol/L (ref 101–111)
Creatinine, Ser: 0.81 mg/dL (ref 0.61–1.24)
GFR calc non Af Amer: >60 mL/min (ref >60)
Glucose, Bld: 251 mg/dL — ABNORMAL HIGH (ref 65–99)
Potassium: 3.9 mmol/L (ref 3.5–5.1)
SODIUM: 133 mmol/L — AB (ref 135–145)

## 2015-03-21 LAB — CBC
HEMATOCRIT: 38.1 % — AB (ref 39.0–52.0)
HEMOGLOBIN: 13.1 g/dL (ref 13.0–17.0)
MCH: 31.6 pg (ref 26.0–34.0)
MCHC: 34.4 g/dL (ref 30.0–36.0)
MCV: 92 fL (ref 78.0–100.0)
Platelets: 200 10*3/uL (ref 150–400)
RBC: 4.14 MIL/uL — ABNORMAL LOW (ref 4.22–5.81)
RDW: 15.6 % — ABNORMAL HIGH (ref 11.5–15.5)
WBC: 6.7 10*3/uL (ref 4.0–10.5)

## 2015-03-21 LAB — TROPONIN I: TROPONIN I: 3.83 ng/mL — AB (ref <0.031)

## 2015-03-21 MED ORDER — SODIUM CHLORIDE 0.9 % IV SOLN: 250.0000 mL | INTRAVENOUS | Status: DC | PRN

## 2015-03-21 MED ORDER — ASPIRIN 81 MG PO CHEW
81.0000 mg | CHEWABLE_TABLET | ORAL | Status: AC
  Administered 2015-03-22: 81 mg via ORAL
  Filled 2015-03-21: qty 1

## 2015-03-21 MED ORDER — SODIUM CHLORIDE 0.9 % IJ SOLN
3.0000 mL | INTRAMUSCULAR | Status: DC | PRN
Start: 2015-03-21 — End: 2015-03-22

## 2015-03-21 MED ORDER — PREDNISONE 20 MG PO TABS
60.0000 mg | ORAL_TABLET | ORAL | Status: AC
  Administered 2015-03-22: 60 mg via ORAL
  Filled 2015-03-21: qty 3

## 2015-03-21 MED ORDER — SODIUM CHLORIDE 0.9 % IJ SOLN: 3.0000 mL | Freq: Two times a day (BID) | INTRAMUSCULAR | Status: DC

## 2015-03-21 MED ORDER — FAMOTIDINE IN NACL 20-0.9 MG/50ML-% IV SOLN
20.0000 mg | INTRAVENOUS | Status: AC
  Administered 2015-03-22: 20 mg via INTRAVENOUS
  Filled 2015-03-21: qty 50

## 2015-03-21 MED ORDER — SODIUM CHLORIDE 0.9 % WEIGHT BASED INFUSION
1.0000 mL/kg/h | INTRAVENOUS | Status: DC
  Administered 2015-03-22: 1 mL/kg/h via INTRAVENOUS

## 2015-03-21 MED ORDER — SODIUM CHLORIDE 0.9 % WEIGHT BASED INFUSION
3.0000 mL/kg/h | INTRAVENOUS | Status: DC
  Administered 2015-03-22: 3 mL/kg/h via INTRAVENOUS

## 2015-03-21 MED ORDER — DIPHENHYDRAMINE HCL 50 MG/ML IJ SOLN
25.0000 mg | INTRAMUSCULAR | Status: AC
  Administered 2015-03-22: 25 mg via INTRAVENOUS
  Filled 2015-03-21: qty 1

## 2015-03-21 NOTE — Progress Notes (Signed)
Troponins trending up from 4.81 to 7.44. Pt has been cp free.  Md on call paged. Will cont to monitor pt.

## 2015-03-21 NOTE — Progress Notes (Signed)
Subjective:  Currently pain-free.  Admitted with prolonged chest discomfort in and has significant troponin elevation last night.  Objective:  Vital Signs in the last 24 hours: BP 122/66 mmHg  Pulse 74  Temp(Src) 98.3 F (36.8 C) (Oral)  Resp 18  Ht 6' (1.829 m)  Wt 98.476 kg (217 lb 1.6 oz)  BMI 29.44 kg/m2  SpO2 97%  Physical Exam: Pleasant mildly obese white male in no acute distress Lungs:  Clear Cardiac:  Regular rhythm, normal S1 and S2, no S3 Extremities:  No edema present  Intake/Output from previous day: 07/09 0701 - 07/10 0700 In: 990 [P.O.:240; I.V.:750] Out: 500 [Urine:500]  Weight Filed Weights   03/20/15 1500 03/21/15 0400  Weight: 98.93 kg (218 lb 1.6 oz) 98.476 kg (217 lb 1.6 oz)    Lab Results: Basic Metabolic Panel:  Recent Labs  03/20/15 1020 03/21/15 0239  NA 134* 133*  K 4.3 3.9  CL 104 104  CO2 19* 23  GLUCOSE 377* 251*  BUN 13 11  CREATININE 0.87 0.81   CBC:  Recent Labs  03/20/15 1020 03/21/15 0239  WBC 8.0 6.7  HGB 14.4 13.1  HCT 41.5 38.1*  MCV 90.6 92.0  PLT 216 200   Cardiac Enzymes: Troponin (Point of Care Test)  Recent Labs  03/20/15 1030  TROPIPOC 0.00   Cardiac Panel (last 3 results)  Recent Labs  03/20/15 1612 03/20/15 2040 03/21/15 0239  TROPONINI 4.81* 7.44* 3.83*    Telemetry: Sinus rhythm  Assessment/Plan:  1.  Non-STEMI 2.  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  Ischemic cardiomyopathy 4.  Poorly controlled diabetes  Recommendations:  Place on heparin because of non-STEMI.  Repeat catheterization tomorrow. Cardiac catheterization procedure was discussed with the patient fully including risks of myocardial infarction, death, stroke, bleeding, arrhythmia, dye allergy, or renal insufficiency. The patient understands and is willing to proceed.  Premedication for previous contrast allergy.     Kerry Hough  MD  [facility]  03/21/2015, 9:33 AM

## 2015-03-22 ENCOUNTER — Encounter (HOSPITAL_COMMUNITY)
Admission: EM | Disposition: A | Discharge status: Home / Self Care | Payer: Commercial Managed Care - HMO | Source: Home / Self Care | Attending: Cardiovascular Disease

## 2015-03-22 ENCOUNTER — Encounter (HOSPITAL_COMMUNITY): Payer: Self-pay | Admitting: Cardiology

## 2015-03-22 DIAGNOSIS — Z9861 Coronary angioplasty status: Secondary | ICD-10-CM

## 2015-03-22 DIAGNOSIS — I2510 Atherosclerotic heart disease of native coronary artery without angina pectoris: Secondary | ICD-10-CM

## 2015-03-22 DIAGNOSIS — I214 Non-ST elevation (NSTEMI) myocardial infarction: Secondary | ICD-10-CM | POA: Insufficient documentation

## 2015-03-22 HISTORY — PX: CARDIAC CATHETERIZATION: SHX172

## 2015-03-22 LAB — HEMOGLOBIN A1C
Hgb A1c MFr Bld: 11.9 % — ABNORMAL HIGH (ref 4.8–5.6)
Mean Plasma Glucose: 295 mg/dL

## 2015-03-22 LAB — GLUCOSE, CAPILLARY
Glucose-Capillary: 332 mg/dL — ABNORMAL HIGH (ref 65–99)
Glucose-Capillary: 472 mg/dL — ABNORMAL HIGH (ref 65–99)
Glucose-Capillary: 368 mg/dL — ABNORMAL HIGH (ref 65–99)
Glucose-Capillary: 248 mg/dL — ABNORMAL HIGH (ref 65–99)

## 2015-03-22 LAB — PROTIME-INR
INR: 1.12 (ref 0.00–1.49)
PROTHROMBIN TIME: 14.6 s (ref 11.6–15.2)

## 2015-03-22 SURGERY — LEFT HEART CATH AND CORONARY ANGIOGRAPHY
Anesthesia: LOCAL

## 2015-03-22 MED ORDER — SODIUM CHLORIDE 0.9 % IJ SOLN: 3.0000 mL | INTRAMUSCULAR | Status: DC | PRN

## 2015-03-22 MED ORDER — LIDOCAINE HCL (PF) 1 % IJ SOLN
INTRAMUSCULAR | Status: AC
  Filled 2015-03-22: qty 30

## 2015-03-22 MED ORDER — INSULIN ASPART 100 UNIT/ML ~~LOC~~ SOLN
0.0000 [IU] | Freq: Three times a day (TID) | SUBCUTANEOUS | Status: DC
  Administered 2015-03-22: 7 [IU] via SUBCUTANEOUS
  Administered 2015-03-22: 20 [IU] via SUBCUTANEOUS
  Administered 2015-03-23 (×2): 7 [IU] via SUBCUTANEOUS

## 2015-03-22 MED ORDER — HEPARIN (PORCINE) IN NACL 2-0.9 UNIT/ML-% IJ SOLN
INTRAMUSCULAR | Status: AC
  Filled 2015-03-22: qty 1500

## 2015-03-22 MED ORDER — SODIUM CHLORIDE 0.9 % IV SOLN: 250.0000 mL | INTRAVENOUS | Status: DC | PRN

## 2015-03-22 MED ORDER — FENTANYL CITRATE (PF) 100 MCG/2ML IJ SOLN
INTRAMUSCULAR | Status: DC | PRN
  Administered 2015-03-22: 25 ug via INTRAVENOUS

## 2015-03-22 MED ORDER — ENOXAPARIN SODIUM 100 MG/ML ~~LOC~~ SOLN
100.0000 mg | Freq: Two times a day (BID) | SUBCUTANEOUS | Status: DC
  Administered 2015-03-22 – 2015-03-23 (×2): 100 mg via SUBCUTANEOUS
  Filled 2015-03-22 (×2): qty 1

## 2015-03-22 MED ORDER — ENOXAPARIN SODIUM 100 MG/ML ~~LOC~~ SOLN: 100.0000 mg | Freq: Two times a day (BID) | SUBCUTANEOUS | Status: DC

## 2015-03-22 MED ORDER — FENTANYL CITRATE (PF) 100 MCG/2ML IJ SOLN
INTRAMUSCULAR | Status: AC
Start: 2015-03-22 — End: 2015-03-22
  Filled 2015-03-22: qty 2

## 2015-03-22 MED ORDER — VERAPAMIL HCL 2.5 MG/ML IV SOLN
INTRAVENOUS | Status: AC
  Filled 2015-03-22: qty 2

## 2015-03-22 MED ORDER — MIDAZOLAM HCL 2 MG/2ML IJ SOLN
INTRAMUSCULAR | Status: DC | PRN
  Administered 2015-03-22: 2 mg via INTRAVENOUS

## 2015-03-22 MED ORDER — SODIUM CHLORIDE 0.9 % IJ SOLN
3.0000 mL | Freq: Two times a day (BID) | INTRAMUSCULAR | Status: DC
  Administered 2015-03-22: 10 mL via INTRAVENOUS
  Administered 2015-03-23: 3 mL via INTRAVENOUS

## 2015-03-22 MED ORDER — IOHEXOL 350 MG/ML SOLN
INTRAVENOUS | Status: DC | PRN
  Administered 2015-03-22: 90 mL via INTRACARDIAC

## 2015-03-22 MED ORDER — SODIUM CHLORIDE 0.9 % WEIGHT BASED INFUSION
3.0000 mL/kg/h | INTRAVENOUS | Status: AC
  Administered 2015-03-22 (×2): 3 mL/kg/h via INTRAVENOUS

## 2015-03-22 MED ORDER — NITROGLYCERIN 1 MG/10 ML FOR IR/CATH LAB
INTRA_ARTERIAL | Status: AC
  Filled 2015-03-22: qty 10

## 2015-03-22 MED ORDER — INSULIN ASPART 100 UNIT/ML ~~LOC~~ SOLN: 0.0000 [IU] | Freq: Every day | SUBCUTANEOUS | Status: DC

## 2015-03-22 MED ORDER — MIDAZOLAM HCL 2 MG/2ML IJ SOLN
INTRAMUSCULAR | Status: AC
  Filled 2015-03-22: qty 2

## 2015-03-22 MED ORDER — HEPARIN SODIUM (PORCINE) 1000 UNIT/ML IJ SOLN
INTRAMUSCULAR | Status: AC
  Filled 2015-03-22: qty 1

## 2015-03-22 MED ORDER — HEPARIN SODIUM (PORCINE) 1000 UNIT/ML IJ SOLN
INTRAMUSCULAR | Status: DC | PRN
  Administered 2015-03-22: 5000 [IU] via INTRAVENOUS

## 2015-03-22 MED ORDER — LIDOCAINE HCL (PF) 1 % IJ SOLN
INTRAMUSCULAR | Status: DC | PRN
  Administered 2015-03-22: 5 mL

## 2015-03-22 MED ORDER — VERAPAMIL HCL 2.5 MG/ML IV SOLN
INTRAVENOUS | Status: DC | PRN
  Administered 2015-03-22: 08:00:00 via INTRA_ARTERIAL

## 2015-03-22 MED ORDER — INSULIN ASPART 100 UNIT/ML ~~LOC~~ SOLN
15.0000 [IU] | Freq: Once | SUBCUTANEOUS | Status: AC
  Administered 2015-03-22: 15 [IU] via SUBCUTANEOUS

## 2015-03-22 SURGICAL SUPPLY — 16 items
CATH INFINITI 5 FR JL3.5 (CATHETERS) ×1 IMPLANT
CATH INFINITI 5FR ANG PIGTAIL (CATHETERS) ×2 IMPLANT
CATH INFINITI 5FR MULTPACK ANG (CATHETERS) IMPLANT
CATH INFINITI JR4 5F (CATHETERS) ×1 IMPLANT
CATH LAUNCHER 5F EBU3.5 (CATHETERS) ×1 IMPLANT
CATH OPTITORQUE TIG 4.0 5F (CATHETERS) ×2 IMPLANT
DEVICE RAD COMP TR BAND LRG (VASCULAR PRODUCTS) ×2 IMPLANT
GLIDESHEATH SLEND A-KIT 6F 22G (SHEATH) ×2 IMPLANT
KIT HEART LEFT (KITS) ×2 IMPLANT
PACK CARDIAC CATHETERIZATION (CUSTOM PROCEDURE TRAY) ×2 IMPLANT
SHEATH PINNACLE 5F 10CM (SHEATH) IMPLANT
SYR MEDRAD MARK V 150ML (SYRINGE) ×2 IMPLANT
TRANSDUCER W/STOPCOCK (MISCELLANEOUS) ×2 IMPLANT
TUBING CIL FLEX 10 FLL-RA (TUBING) ×2 IMPLANT
WIRE EMERALD 3MM-J .035X150CM (WIRE) IMPLANT
WIRE SAFE-T 1.5MM-J .035X260CM (WIRE) ×2 IMPLANT

## 2015-03-22 NOTE — Progress Notes (Signed)
UR Completed Bertie Mcconathy Graves-Bigelow, RN,BSN [phone]  

## 2015-03-22 NOTE — Progress Notes (Signed)
Waited one hour to begin deflation of TR band.  Immediately started to bleed after initial 3cc air removed.  Injected 3cc air back into band and it stopped bleeding.  Will continue to closely monitor.

## 2015-03-22 NOTE — Research (Signed)
DAL-GENE Informed Consent   Subject Name: Robert [name]  Subject met inclusion and exclusion criteria.  The informed consent form, study requirements and expectations were reviewed with the subject and questions and concerns were addressed prior to the signing of the consent form.  The subject verbalized understanding of the trail requirements.  The subject agreed to participate in the DAL-GENE trial and signed the informed consent.  The informed consent was obtained prior to performance of any protocol-specific procedures for the subject.  A copy of the signed informed consent was given to the subject and a copy was placed in the subject's medical record.  Hedrick,Mikel Pyon W 03/22/2015, 11:16 AM

## 2015-03-22 NOTE — H&P (View-Only) (Signed)
Subjective:  Currently pain-free.  Admitted with prolonged chest discomfort in and has significant troponin elevation last night.  Objective:  Vital Signs in the last 24 hours: BP 122/66 mmHg  Pulse 74  Temp(Src) 98.3 F (36.8 C) (Oral)  Resp 18  Ht 6' (1.829 m)  Wt 98.476 kg (217 lb 1.6 oz)  BMI 29.44 kg/m2  SpO2 97%  Physical Exam: Pleasant mildly obese white male in no acute distress Lungs:  Clear Cardiac:  Regular rhythm, normal S1 and S2, no S3 Extremities:  No edema present  Intake/Output from previous day: 07/09 0701 - 07/10 0700 In: 990 [P.O.:240; I.V.:750] Out: 500 [Urine:500]  Weight Filed Weights   03/20/15 1500 03/21/15 0400  Weight: 98.93 kg (218 lb 1.6 oz) 98.476 kg (217 lb 1.6 oz)    Lab Results: Basic Metabolic Panel:  Recent Labs  03/20/15 1020 03/21/15 0239  NA 134* 133*  K 4.3 3.9  CL 104 104  CO2 19* 23  GLUCOSE 377* 251*  BUN 13 11  CREATININE 0.87 0.81   CBC:  Recent Labs  03/20/15 1020 03/21/15 0239  WBC 8.0 6.7  HGB 14.4 13.1  HCT 41.5 38.1*  MCV 90.6 92.0  PLT 216 200   Cardiac Enzymes: Troponin (Point of Care Test)  Recent Labs  03/20/15 1030  TROPIPOC 0.00   Cardiac Panel (last 3 results)  Recent Labs  03/20/15 1612 03/20/15 2040 03/21/15 0239  TROPONINI 4.81* 7.44* 3.83*    Telemetry: Sinus rhythm  Assessment/Plan:  1.  Non-STEMI 2.  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  Ischemic cardiomyopathy 4.  Poorly controlled diabetes  Recommendations:  Place on heparin because of non-STEMI.  Repeat catheterization tomorrow. Cardiac catheterization procedure was discussed with the patient fully including risks of myocardial infarction, death, stroke, bleeding, arrhythmia, dye allergy, or renal insufficiency. The patient understands and is willing to proceed.  Premedication for previous contrast allergy.     Kerry Hough  MD  [facility] Cardiology  03/21/2015, 9:33 AM

## 2015-03-22 NOTE — Care Management (Signed)
Important Message  Patient Details  Name: Robert [name] MRN: [id number] Date of Birth: 1950-12-26   Medicare Important Message Given:  Yes-second notification given    Nathen May 03/22/2015, 2:03 PM

## 2015-03-22 NOTE — Interval H&P Note (Signed)
History and Physical Interval Note:  03/22/2015 7:07 AM  Robert [name]  has presented today for surgery, with the diagnosis of NON-STEMI - KNOWN CAD.   The various methods of treatment have been discussed with the patient and family. After consideration of risks, benefits and other options for treatment, the patient has consented to  Procedure(s): Left Heart Cath and Coronary Angiography (N/A) as a surgical intervention .  The patient's history has been reviewed, patient examined, no change in status, stable for surgery.  I have reviewed the patient's chart and labs.  Questions were answered to the patient's satisfaction.    Cath Lab Visit (complete for each Cath Lab visit)  Clinical Evaluation Leading to the Procedure:   ACS: Yes.    Non-ACS:    Anginal Classification: CCS IV  Anti-ischemic medical therapy: Maximal Therapy (2 or more classes of medications)  Non-Invasive Test Results: No non-invasive testing performed  Prior CABG: No previous CABG   TIMI Score  Patient Information:  TIMI Score is 5  Revascularization of the presumed culprit artery  A (9)  Indication: 11; Score: 9 TIMI Score  Patient Information:  TIMI Score is 5  Revascularization of multiple coronary arteries when the culprit artery cannot clearly be determined  A (9)  Indication: 12; Score: 9   Robert [name] W

## 2015-03-22 NOTE — Progress Notes (Signed)
[place] for lovenox Indication: chest pain/ACS  Allergies  Allergen Reactions  . Ivp Dye [Iodinated Diagnostic Agents] Hives and Itching  . Clopidogrel Bisulfate Rash  . Iodine Rash    Contrast Dye  . Plavix [Clopidogrel Bisulfate] Rash    Patient Measurements: Height: 6' (182.9 cm) Weight: 221 lb 3.2 oz (100.336 kg) IBW/kg (Calculated) : 77.6 Heparin Dosing Weight:   Vital Signs: Temp: 98.7 F (37.1 C) (07/11 0500) BP: 136/87 mmHg (07/11 0835) Pulse Rate: 0 (07/11 0840)  Labs:  Recent Labs  03/20/15 1020 03/20/15 1612 03/20/15 2040 03/21/15 0239 03/22/15 0545  HGB 14.4  --   --  13.1  --   HCT 41.5  --   --  38.1*  --   PLT 216  --   --  200  --   LABPROT  --   --   --   --  14.6  INR  --   --   --   --  1.12  CREATININE 0.87  --   --  0.81  --   TROPONINI  --  4.81* 7.44* 3.83*  --     Estimated Creatinine Clearance: 117.4 mL/min (by C-G formula based on Cr of 0.81).   Medical History: Past Medical History  Diagnosis Date  . Hypertension   . Coronary artery disease   . Unstable angina 12/22/2011  . PVD, chronic LLE 12/22/2011  . High cholesterol   . Chronic bronchitis   . Type II diabetes mellitus   . S/P angioplasty with stent of LCX 05/16/12 with expedition DES 05/17/2012  . Tobacco abuse   . 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 Rheumatoid arthritis(714.0)     "qwhere on my body"  . Anxiety     Medications:  Scheduled:  . aspirin EC  81 mg Oral Daily  . atorvastatin  80 mg Oral q1800  . glipiZIDE  10 mg Oral BID WC  . hydroxychloroquine  200 mg Oral BID  . insulin aspart  0-15 Units Subcutaneous TID WC  . insulin glargine  50 Units Subcutaneous QHS  . isosorbide mononitrate  120 mg Oral Daily  . [START ON 03/25/2015] methotrexate  25 mg Oral Weekly  . metoprolol tartrate  25 mg Oral BID  . quinapril  20 mg Oral QHS  . sodium chloride  3 mL  Intravenous Q12H  . sodium chloride  3 mL Intravenous Q12H  . sulfaSALAzine  500 mg Oral BID  . ticagrelor  90 mg Oral BID   Infusions:  . sodium chloride Stopped (03/22/15 0550)  . sodium chloride 3 mL/kg/hr (03/22/15 0857)    Assessment: 64 yo male s/p cath, will plan to resume full dose Lovenox 6 hours after TR band removal. BP/HR, cbc stable.  Goal of Therapy:  Anti-Xa level 0.6-1 units/ml 4hrs after LMWH dose given Monitor platelets by anticoagulation protocol: Yes   Plan:  - lovenox 100 mg sq q12h -- will begin approximately 1900 tonight - CBC every 72 hours    Hughes Better, PharmD, BCPS Clinical Pharmacist Pager: [phone] 03/22/2015 9:29 AM

## 2015-03-22 NOTE — Care Management Note (Addendum)
Case Management Note  Patient Details  Name: Robert [name] MRN: [id number] Date of Birth: 11-21-1950  Subjective/Objective: Pt admitted for Nstemi. Post cardiac cath- plan for medical management.                    Action/Plan: No needs identified by CM at this time.    Expected Discharge Date:                  Expected Discharge Plan:  Home/Self Care  In-House Referral:     Discharge planning Services  CM Consult  Post Acute Care Choice:    Choice offered to:     DME Arranged:    DME Agency:     HH Arranged:    HH Agency:     Status of Service:  In process, will continue to follow  Medicare Important Message Given:    Date Medicare IM Given:    Medicare IM give by:    Date Additional Medicare IM Given:    Additional Medicare Important Message give by:     If discussed at [place] of Stay Meetings, dates discussed:    Additional Comments:  Bethena Roys, RN 03/22/2015, 1:33 PM

## 2015-03-23 ENCOUNTER — Other Ambulatory Visit: Payer: Self-pay | Admitting: Cardiovascular Disease

## 2015-03-23 ENCOUNTER — Telehealth: Payer: Self-pay | Admitting: Nurse Practitioner

## 2015-03-23 ENCOUNTER — Other Ambulatory Visit: Payer: Self-pay | Admitting: *Deleted

## 2015-03-23 DIAGNOSIS — Z9861 Coronary angioplasty status: Secondary | ICD-10-CM

## 2015-03-23 LAB — BASIC METABOLIC PANEL
ANION GAP: 8 (ref 5–15)
BUN: 8 mg/dL (ref 6–20)
CO2: 24 mmol/L (ref 22–32)
CREATININE: 0.69 mg/dL (ref 0.61–1.24)
Calcium: 8.9 mg/dL (ref 8.9–10.3)
Chloride: 104 mmol/L (ref 101–111)
GFR calc Af Amer: >60 mL/min (ref >60)
GFR calc non Af Amer: >60 mL/min (ref >60)
Glucose, Bld: 237 mg/dL — ABNORMAL HIGH (ref 65–99)
Potassium: 3.4 mmol/L — ABNORMAL LOW (ref 3.5–5.1)
Sodium: 136 mmol/L (ref 135–145)

## 2015-03-23 LAB — GLUCOSE, CAPILLARY
GLUCOSE-CAPILLARY: 231 mg/dL — AB (ref 65–99)
Glucose-Capillary: 233 mg/dL — ABNORMAL HIGH (ref 65–99)
Glucose-Capillary: 229 mg/dL — ABNORMAL HIGH (ref 65–99)

## 2015-03-23 MED ORDER — ISOSORBIDE MONONITRATE ER 120 MG PO TB24: 120.0000 mg | ORAL_TABLET | Freq: Every day | ORAL | Status: DC

## 2015-03-23 MED ORDER — METOPROLOL TARTRATE 25 MG PO TABS: 25.0000 mg | ORAL_TABLET | Freq: Two times a day (BID) | ORAL | Status: DC

## 2015-03-23 MED ORDER — POTASSIUM CHLORIDE 20 MEQ/15ML (10%) PO SOLN: 40.0000 meq | ORAL | Status: DC

## 2015-03-23 MED ORDER — POTASSIUM CHLORIDE CRYS ER 20 MEQ PO TBCR
40.0000 meq | EXTENDED_RELEASE_TABLET | Freq: Once | ORAL | Status: AC
Start: 2015-03-23 — End: 2015-03-23
  Administered 2015-03-23: 40 meq via ORAL
  Filled 2015-03-23: qty 2

## 2015-03-23 MED ORDER — METFORMIN HCL 1000 MG PO TABS
1000.0000 mg | ORAL_TABLET | Freq: Two times a day (BID) | ORAL | Status: DC
Start: 2015-03-23 — End: 2019-06-07

## 2015-03-23 MED ORDER — ATORVASTATIN CALCIUM 80 MG PO TABS: 80.0000 mg | ORAL_TABLET | Freq: Every day | ORAL | Status: DC

## 2015-03-23 MED FILL — Heparin Sodium (Porcine) 2 Unit/ML in Sodium Chloride 0.9%: INTRAMUSCULAR | Qty: 1500 | Status: AC

## 2015-03-23 NOTE — Discharge Instructions (Signed)
Radial Site Care Refer to this sheet in the next few weeks. These instructions provide you with information on caring for yourself after your procedure. Your caregiver may also give you more specific instructions. Your treatment has been planned according to current medical practices, but problems sometimes occur. Call your caregiver if you have any problems or questions after your procedure. HOME CARE INSTRUCTIONS  You may shower the day after the procedure.Remove the bandage (dressing) and gently wash the site with plain soap and water.Gently pat the site dry.  Do not apply powder or lotion to the site.  Do not submerge the affected site in water for 3 to 5 days.  Inspect the site at least twice daily.  Do not flex or bend the affected arm for 24 hours.  No lifting over 5 pounds (2.3 kg) for 5 days after your procedure.  Do not drive home if you are discharged the same day of the procedure. Have someone else drive you.  You may drive 24 hours after the procedure unless otherwise instructed by your caregiver.  Do not operate machinery or power tools for 24 hours.  A responsible adult should be with you for the first 24 hours after you arrive home. What to expect:  Any bruising will usually fade within 1 to 2 weeks.  Blood that collects in the tissue (hematoma) may be painful to the touch. It should usually decrease in size and tenderness within 1 to 2 weeks. SEEK IMMEDIATE MEDICAL CARE IF:  You have unusual pain at the radial site.  You have redness, warmth, swelling, or pain at the radial site.  You have drainage (other than a small amount of blood on the dressing).  You have chills.  You have a fever or persistent symptoms for more than 72 hours.  You have a fever and your symptoms suddenly get worse.  Your arm becomes pale, cool, tingly, or numb.  You have heavy bleeding from the site. Hold pressure on the site. Document Released: 09/30/2010 Document Revised:  11/20/2011 Document Reviewed: 09/30/2010 [facility] Patient Information 2015 [place], [place]. This information is not intended to replace advice given to you by your health care provider. Make sure you discuss any questions you have with your health care provider.   Cardiac Diet This diet can help prevent heart disease and stroke. Many factors influence your heart health, including eating and exercise habits. Coronary risk rises a lot with abnormal blood fat (lipid) levels. Cardiac meal planning includes limiting unhealthy fats, increasing healthy fats, and making other small dietary changes. General guidelines are as follows:  Adjust calorie intake to reach and maintain desirable body weight.  Limit total fat intake to less than 30% of total calories. Saturated fat should be less than 7% of calories.  Saturated fats are found in animal products and in some vegetable products. Saturated vegetable fats are found in coconut oil, cocoa butter, palm oil, and palm kernel oil. Read labels carefully to avoid these products as much as possible. Use butter in moderation. Choose tub margarines and oils that have 2 grams of fat or less. Good cooking oils are canola and olive oils.  Practice low-fat cooking techniques. Do not fry food. Instead, broil, bake, boil, steam, grill, roast on a rack, stir-fry, or microwave it. Other fat reducing suggestions include:  Remove the skin from poultry.  Remove all visible fat from meats.  Skim the fat off stews, soups, and gravies before serving them.  Steam vegetables in water or broth  instead of sauting them in fat.  Avoid foods with trans fat (or hydrogenated oils), such as commercially fried foods and commercially baked goods. Commercial shortening and deep-frying fats will contain trans fat.  Increase intake of fruits, vegetables, whole grains, and legumes to replace foods high in fat.  Increase consumption of nuts, legumes, and seeds to at least 4 servings  weekly. One serving of a legume equals  cup, and 1 serving of nuts or seeds equals  cup.  Choose whole grains more often. Have 3 servings per day (a serving is 1 ounce [oz]).  Eat 4 to 5 servings of vegetables per day. A serving of vegetables is 1 cup of raw leafy vegetables;  cup of raw or cooked cut-up vegetables;  cup of vegetable juice.  Eat 4 to 5 servings of fruit per day. A serving of fruit is 1 medium whole fruit;  cup of dried fruit;  cup of fresh, frozen, or canned fruit;  cup of 100% fruit juice.  Increase your intake of dietary fiber to 20 to 30 grams per day. Insoluble fiber may help lower your risk of heart disease and may help curb your appetite.  Soluble fiber binds cholesterol to be removed from the blood. Foods high in soluble fiber are dried beans, citrus fruits, oats, apples, bananas, broccoli, Brussels sprouts, and eggplant.  Try to include foods fortified with plant sterols or stanols, such as yogurt, breads, juices, or margarines. Choose several fortified foods to achieve a daily intake of 2 to 3 grams of plant sterols or stanols.  Foods with omega-3 fats can help reduce your risk of heart disease. Aim to have a 3.5 oz portion of fatty fish twice per week, such as salmon, mackerel, albacore tuna, sardines, lake trout, or herring. If you wish to take a fish oil supplement, choose one that contains 1 gram of both DHA and EPA.  Limit processed meats to 2 servings (3 oz portion) weekly.  Limit the sodium in your diet to 1500 milligrams (mg) per day. If you have high blood pressure, talk to a registered dietitian about a DASH (Dietary Approaches to Stop Hypertension) eating plan.  Limit sweets and beverages with added sugar, such as soda, to no more than 5 servings per week. One serving is:   1 tablespoon sugar.  1 tablespoon jelly or jam.   cup sorbet.  1 cup lemonade.   cup regular soda. CHOOSING FOODS Starches  Allowed: Breads: All kinds (wheat, rye,  raisin, white, oatmeal, [place], [place], and English muffin bread). Low-fat rolls: English muffins, frankfurter and hamburger buns, bagels, pita bread, tortillas (not fried). Pancakes, waffles, biscuits, and muffins made with recommended oil.  Avoid: Products made with saturated or trans fats, oils, or whole milk products. Butter rolls, cheese breads, croissants. Commercial doughnuts, muffins, sweet rolls, biscuits, waffles, pancakes, store-bought mixes. Crackers  Allowed: Low-fat crackers and snacks: Animal, graham, rye, saltine (with recommended oil, no lard), oyster, and matzo crackers. Bread sticks, melba toast, rusks, flatbread, pretzels, and light popcorn.  Avoid: High-fat crackers: cheese crackers, butter crackers, and those made with coconut, palm oil, or trans fat (hydrogenated oils). Buttered popcorn. Cereals  Allowed: Hot or cold whole-grain cereals.  Avoid: Cereals containing coconut, hydrogenated vegetable fat, or animal fat. Potatoes / Pasta / Rice  Allowed: All kinds of potatoes, rice, and pasta (such as macaroni, spaghetti, and noodles).  Avoid: Pasta or rice prepared with cream sauce or high-fat cheese. Chow mein noodles, [place] fries. Vegetables  Allowed: All vegetables  and vegetable juices.  Avoid: Fried vegetables. Vegetables in cream, butter, or high-fat cheese sauces. Limit coconut. Fruit in cream or custard. Protein  Allowed: Limit your intake of meat, seafood, and poultry to no more than 6 oz (cooked weight) per day. All lean, well-trimmed beef, veal, pork, and lamb. All chicken and [place] without skin. All fish and shellfish. Wild game: wild duck, rabbit, pheasant, and venison. Egg whites or low-cholesterol egg substitutes may be used as desired. Meatless dishes: recipes with dried beans, peas, lentils, and tofu (soybean curd). Seeds and nuts: all seeds and most nuts.  Avoid: Prime grade and other heavily marbled and fatty meats, such as short ribs, spare ribs, rib  eye roast or steak, frankfurters, sausage, bacon, and high-fat luncheon meats, mutton. Caviar. Commercially fried fish. Domestic duck, goose, venison sausage. Organ meats: liver, gizzard, heart, chitterlings, brains, kidney, sweetbreads. Dairy  Allowed: Low-fat cheeses: nonfat or low-fat cottage cheese (1% or 2% fat), cheeses made with part skim milk, such as mozzarella, farmers, string, or ricotta. (Cheeses should be labeled no more than 2 to 6 grams fat per oz.). Skim (or 1%) milk: liquid, powdered, or evaporated. Buttermilk made with low-fat milk. Drinks made with skim or low-fat milk or cocoa. Chocolate milk or cocoa made with skim or low-fat (1%) milk. Nonfat or low-fat yogurt.  Avoid: Whole milk cheeses, including colby, cheddar, mu[place], Monterey Jack, [place], [place], [place], American, Swiss, and blue. Creamed cottage cheese, cream cheese. Whole milk and whole milk products, including buttermilk or yogurt made from whole milk, drinks made from whole milk. Condensed milk, evaporated whole milk, and 2% milk. Soups and Combination Foods  Allowed: Low-fat low-sodium soups: broth, dehydrated soups, homemade broth, soups with the fat removed, homemade cream soups made with skim or low-fat milk. Low-fat spaghetti, lasagna, chili, and Spanish rice if low-fat ingredients and low-fat cooking techniques are used.  Avoid: Cream soups made with whole milk, cream, or high-fat cheese. All other soups. Desserts and Sweets  Allowed: Sherbet, fruit ices, gelatins, meringues, and angel food cake. Homemade desserts with recommended fats, oils, and milk products. Jam, jelly, honey, marmalade, sugars, and syrups. Pure sugar candy, such as gum drops, hard candy, jelly beans, marshmallows, mints, and small amounts of dark chocolate.  Avoid: Commercially prepared cakes, pies, cookies, frosting, pudding, or mixes for these products. Desserts containing whole milk products, chocolate, coconut, lard, palm oil, or palm  kernel oil. Ice cream or ice cream drinks. Candy that contains chocolate, coconut, butter, hydrogenated fat, or unknown ingredients. Buttered syrups. Fats and Oils  Allowed: Vegetable oils: safflower, sunflower, corn, soybean, cottonseed, sesame, canola, olive, or peanut. Non-hydrogenated margarines. Salad dressing or mayonnaise: homemade or commercial, made with a recommended oil. Low or nonfat salad dressing or mayonnaise.  Limit added fats and oils to 6 to 8 tsp per day (includes fats used in cooking, baking, salads, and spreads on bread). Remember to count the "hidden fats" in foods.  Avoid: Solid fats and shortenings: butter, lard, salt pork, bacon drippings. Gravy containing meat fat, shortening, or suet. Cocoa butter, coconut. Coconut oil, palm oil, palm kernel oil, or hydrogenated oils: these ingredients are often used in bakery products, nondairy creamers, whipped toppings, candy, and commercially fried foods. Read labels carefully. Salad dressings made of unknown oils, sour cream, or cheese, such as blue cheese and [place]ef[place]. Cream, all kinds: half-and-half, light, heavy, or whipping. Sour cream or cream cheese (even if "light" or low-fat). Nondairy cream substitutes: coffee creamers and sour cream substitutes made with  palm, palm kernel, hydrogenated oils, or coconut oil. Beverages  Allowed: Coffee (regular or decaffeinated), tea. Diet carbonated beverages, mineral water. Alcohol: Check with your caregiver. Moderation is recommended.  Avoid: Whole milk, regular sodas, and juice drinks with added sugar. Condiments  Allowed: All seasonings and condiments. Cocoa powder. "Cream" sauces made with recommended ingredients.  Avoid: Carob powder made with hydrogenated fats. SAMPLE MENU Breakfast   cup orange juice   cup oatmeal  1 slice toast  1 tsp margarine  1 cup skim milk Lunch  [place] sandwich with 2 oz [place], 2 slices bread  Lettuce and tomato slices  Fresh  fruit  Carrot sticks  Coffee or tea Snack  Fresh fruit or low-fat crackers Dinner  3 oz lean ground beef  1 baked potato  1 tsp margarine   cup asparagus  Lettuce salad  1 tbs non-creamy dressing   cup peach slices  1 cup skim milk Document Released: 06/06/2008 Document Revised: 02/27/2012 Document Reviewed: 10/28/2013 [employer] Patient Information 2015 [place], [place]. This information is not intended to replace advice given to you by your health care provider. Make sure you discuss any questions you have with your health care provider.

## 2015-03-23 NOTE — Telephone Encounter (Signed)
TCM call for 03/24/15. Primary Cardiologist is Dr. Sallyanne Kuster. Will forward to [facility] for f/u.

## 2015-03-23 NOTE — Discharge Summary (Signed)
Discharge Summary   Patient ID: Robert [name],  MRN: [id number], DOB/AGE: 17-Nov-1950 64 y.o.  Admit date: 03/20/2015 Discharge date: 03/23/2015  Primary Care Provider: Glo Herring Primary Cardiologist: Dr. Sallyanne Kuster  Discharge Diagnoses Principal Problem:   Unstable angina Active Problems:   Type II IDDM   HTN (hypertension)   Tobacco abuse   Rheumatoid arthritis   Contrast media allergy   S/P CFX PTCAI for ISR 02/01/15   DM type 2 causing vascular disease, not at goal   Non-STEMI (non-ST elevated myocardial infarction)   CAD S/P percutaneous coronary angioplasty   Allergies Allergies  Allergen Reactions  . Ivp Dye [Iodinated Diagnostic Agents] Hives and Itching  . Clopidogrel Bisulfate Rash  . Iodine Rash    Contrast Dye  . Plavix [Clopidogrel Bisulfate] Rash    Procedures Cath 03/22/2015 Conclusion    1. Stable Multivessel CAD with known 100% CTO of prox RCA, mild-moderate diffuse ISR of extensively stented Cx & diffuse moderate LAD disease. Also noted (not previously reported) ~90% distal OM3 stenosis in a < 2 mm vessel. 2. Ost Cx to Mid Cx lesion, 35% stenosed. A bare metal stent and drug-eluting stent was placed. The lesion was previously treated with a bare metal stent, a drug-eluting stent and angioplasty between one and five months ago. 3. Ost 1st Mrg to 1st Mrg lesion, 50% stenosed. Ost 2nd Mrg lesion, 30% stenosed. 3rd Mrg lesion, 90% stenosed. 4. Mid LAD lesion, 50% stenosed. 5. Ost RCA to Prox RCA lesion, 100% stenosed. The lesion was not previously treated. 6. Low normal to mildly reduced LVEF with basal to mid Inferior hypokinesis. 7. There is mild left ventricular systolic dysfunction.  The patient basically has stable coronary disease with no obvious culprit lesion. Initial images suggested possibly that the 90% OM 3 lesion could be a potential culprit lesion, however this was actually seen on previous images from May. It is possible that there  was some mild thrombus in the stent segment that was cleared up with subcutaneous Lovenox injection.  Recommendation would be to continue treatment for non-ST elevation MI with adequate granulation. We will repeat start Lovenox 6 hours a TR band removal. Would monitor tonight and if stable overnight after 2 more injections of Lovenox would consider discharge with optimize medical therapy.  Plan for today:  Standard post radial cath TR band removal  Monitor for delayed signs of contrast reaction.  Continue aggressive risk factor modification  Smoking cessation counseling          History of Present Illness  64 year old man with coronary artery disease status post multiple interventions who was admitted last for elective cardiac catheterization on 02/01/15 after abnormal Myoview with contrast allergy premedicated with steroid-induced which revealed in-stent restenosis of circumflex artery which was treated with PTCA here with chest pain.  Has underlying ischemic cardiomyopathy with ejection fraction of 35%, hypertension, dyslipidemia, diabetes, tobacco use. Hemoglobin A1c was 12.4.  He developed constant, for about 3 hours, upper left-sided chest pain 03/20/15 which began after driving to communicate with family member who is helping with the process of moving. Earlier that day, he had moved a very heavy gun case at home. His wife carries around nitroglycerin for him and he tried 2 nitroglycerin's without much relief. He was given another nitroglycerin in the EMS truck. He was felt fairly poor over the last few days prior to admission perhaps secondary to his rheumatoid arthritis. No nausea. He was complaining of mild shortness of breath.  In ED  poc Trop was normal, EKG without acute change, pain subsided with IV nitroglycerine and discontinued.   Hospital Course  He was admitted with ACS protocol and his Imdur increased to 120mg  daily and his BB was continued. His home simvastatin and  gemfibrozil combination discontinued and he was started on atorvastatin 80mg . For uncontrolled DM, he was placed on Lantus 50 units with insulin sliding scale. Held metformin. His troponin trend was 4.81-->7.44-->3.83, however he remained chest pain free. Patient was premedicated for contrast allergy dye. Cath showed stable CAD with no obvious culprit lesion. Initial images suggested possibly that the 90% OM 3 lesion could be a potential culprit lesion, however this was actually seen on previous images from May. It is possible that there was some mild thrombus in the stent segment that was cleared up with subcutaneous Lovenox injection. Given 2 more injections of Lovenox post cath. No further chest pain. Had small bleeding after removal of air from TR band, however no further episode, no hematoma or bruit was noted. He was given supplement for low potassium.  HgbA1c of 11.9. He discharged stably. He was advice to resume metformin Wednesday 7/13 and f/u with PCP. Education given for smoking cessation.   During discharge noted that patient's last lipid panel was in 2014. He suppose to check his cholesterol level prior to July appointment per Tarri Fuller note 02/10/15. However this never done.  Discharge Vitals Blood pressure 129/74, pulse 76, temperature 98.5 F (36.9 C), temperature source Oral, resp. rate 18, height 6' (1.829 m), weight 223 lb 9.6 oz (101.424 kg), SpO2 97 %.  Filed Weights   03/21/15 0400 03/22/15 0500 03/23/15 0438  Weight: 217 lb 1.6 oz (98.476 kg) 221 lb 3.2 oz (100.336 kg) 223 lb 9.6 oz (101.424 kg)    Labs  CBC  Recent Labs  03/21/15 0239  WBC 6.7  HGB 13.1  HCT 38.1*  MCV 92.0  PLT 500   Basic Metabolic Panel  Recent Labs  03/21/15 0239 03/23/15 0820  NA 133* 136  K 3.9 3.4*  CL 104 104  CO2 23 24  GLUCOSE 251* 237*  BUN 11 8  CREATININE 0.81 0.69  CALCIUM 8.8* 8.9    Cardiac Enzymes  Recent Labs  03/20/15 1612 03/20/15 2040 03/21/15 0239    TROPONINI 4.81* 7.44* 3.83*     Recent Labs  03/20/15 1810  HGBA1C 11.9*    Disposition  Pt is being discharged home today in good condition.  Follow-up Plans & Appointments  Follow-up Information    Follow up with Murray Hodgkins, NP On 03/31/2015.   Specialties:  Nurse Practitioner, Cardiology, Radiology   Why:  @ 9:30   Contact information:   [address] [address] [phone]       Follow up with PCP.   Contact information:   please f/u with your PCP for DM, your blood suger was running high hospital.           Discharge Instructions    Diet - low sodium heart healthy    Complete by:  As directed      Discharge instructions    Complete by:  As directed   NO HEAVY LIFTING (>10lbs) X 2 WEEKS. NO SEXUAL ACTIVITY X 2 WEEKS. NO DRIVING X 1 WEEK. NO SOAKING BATHS, HOT TUBS, POOLS, ETC., X 7 DAYS.  Your F/u appointment at [facility] street office.     Increase activity slowly    Complete by:  As directed  F/u Labs/Studies: lipid panel and LFT.   Discharge Medications    Medication List    TAKE these medications        acetaminophen 325 MG tablet  Commonly known as:  TYLENOL  Take 2 tablets (650 mg total) by mouth every 4 (four) hours as needed for headache or mild pain.     ALPRAZolam 0.5 MG tablet  Commonly known as:  XANAX  Take 0.5 mg by mouth 3 (three) times daily as needed. For anxiety     aspirin 81 MG EC tablet  Take 1 tablet (81 mg total) by mouth daily.     atorvastatin 80 MG tablet  Commonly known as:  LIPITOR  Take 1 tablet (80 mg total) by mouth daily at 6 PM.     cetirizine 10 MG tablet  Commonly known as:  ZYRTEC  Take 10 mg by mouth daily as needed for allergies.     fish oil-omega-3 fatty acids 1000 MG capsule  Take 1 g by mouth daily.     folic acid 1 MG tablet  Commonly known as:  FOLVITE  Take 1 mg by mouth daily.     glipiZIDE 10 MG 24 hr tablet  Commonly known as:   GLUCOTROL XL  Take 10 mg by mouth 2 (two) times daily.  Notes to Patient:  TAKE ONE DOSE AT DINNER     hydroxychloroquine 200 MG tablet  Commonly known as:  PLAQUENIL  Take 200 mg by mouth 2 (two) times daily.  Notes to Patient:  TAKE ONE DOSE TONIGHT     insulin glargine 100 UNIT/ML injection  Commonly known as:  LANTUS  Inject 50 Units into the skin at bedtime.  Notes to Patient:  TAKE TAKE TONIGHT     isosorbide mononitrate 120 MG 24 hr tablet  Commonly known as:  IMDUR  Take 1 tablet (120 mg total) by mouth daily.     metFORMIN 1000 MG tablet  Commonly known as:  GLUCOPHAGE  Take 1 tablet (1,000 mg total) by mouth 2 (two) times daily with a meal.     methotrexate 2.5 MG tablet  Commonly known as:  RHEUMATREX  Take 25 mg by mouth once a week. Caution:Chemotherapy. Protect from light. ON THURSDAYS     metoprolol tartrate 25 MG tablet  Commonly known as:  LOPRESSOR  Take 1 tablet (25 mg total) by mouth 2 (two) times daily.  Notes to Patient:  TAKE ONE DOSE TONIGHT     nitroGLYCERIN 0.4 MG SL tablet  Commonly known as:  NITROSTAT  Place 1 tablet (0.4 mg total) under the tongue every 5 (five) minutes x 3 doses as needed for chest pain.     quinapril 40 MG tablet  Commonly known as:  ACCUPRIL  Take 0.5 tablets (20 mg total) by mouth at bedtime.  Notes to Patient:  TAKE TONIGHT     sulfaSALAzine 500 MG tablet  Commonly known as:  AZULFIDINE  Take 500 mg by mouth 2 (two) times daily.  Notes to Patient:  TAKE ONE DOSE TONIGHT     ticagrelor 90 MG Tabs tablet  Commonly known as:  BRILINTA  Take 1 tablet (90 mg total) by mouth 2 (two) times daily.  Notes to Patient:  TAKE ONE DOSE TONIGHT     traMADol 50 MG tablet  Commonly known as:  ULTRAM  Take 50 mg by mouth every 6 (six) hours as needed for moderate pain.        Duration of  Discharge Encounter   Greater than 30 minutes including physician time.  Signed, Kathey Simer PA-C 03/23/2015, 1:22 PM

## 2015-03-23 NOTE — Telephone Encounter (Signed)
Attempted to call pt. But unable to talk with pt. Voicemail has not been set up yet

## 2015-03-23 NOTE — Progress Notes (Signed)
Patient Name: Robert [name] Date of Encounter: 03/23/2015  Principal Problem:   Unstable angina Active Problems:   Type II IDDM   HTN (hypertension)   Tobacco abuse   Rheumatoid arthritis   Contrast media allergy   S/P CFX PTCAI for ISR 02/01/15   DM type 2 causing vascular disease, not at goal   Non-STEMI (non-ST elevated myocardial infarction)   CAD S/P percutaneous coronary angioplasty    SUBJECTIVE  Denies chest pain, sob or palpitation. Had small bleeding after removal of air from TR band, no further episode since then.   CURRENT MEDS . aspirin EC  81 mg Oral Daily  . atorvastatin  80 mg Oral q1800  . enoxaparin (LOVENOX) injection  100 mg Subcutaneous Q12H  . glipiZIDE  10 mg Oral BID WC  . hydroxychloroquine  200 mg Oral BID  . insulin aspart  0-20 Units Subcutaneous TID WC  . insulin aspart  0-5 Units Subcutaneous QHS  . insulin glargine  50 Units Subcutaneous QHS  . isosorbide mononitrate  120 mg Oral Daily  . [START ON 03/25/2015] methotrexate  25 mg Oral Weekly  . metoprolol tartrate  25 mg Oral BID  . quinapril  20 mg Oral QHS  . sodium chloride  3 mL Intravenous Q12H  . sodium chloride  3 mL Intravenous Q12H  . sulfaSALAzine  500 mg Oral BID  . ticagrelor  90 mg Oral BID    OBJECTIVE  Filed Vitals:   03/22/15 1335 03/22/15 2100 03/22/15 2139 03/23/15 0438  BP: 135/75 123/64  135/70  Pulse: 80 71 74   Temp:  98.4 F (36.9 C)  98.5 F (36.9 C)  TempSrc:    Oral  Resp: 24 19  18   Height:      Weight:    223 lb 9.6 oz (101.424 kg)  SpO2: 98% 96%  97%    Intake/Output Summary (Last 24 hours) at 03/23/15 0755 Last data filed at 03/23/15 0526  Gross per 24 hour  Intake   1440 ml  Output   3850 ml  Net  -2410 ml   Filed Weights   03/21/15 0400 03/22/15 0500 03/23/15 0438  Weight: 217 lb 1.6 oz (98.476 kg) 221 lb 3.2 oz (100.336 kg) 223 lb 9.6 oz (101.424 kg)    PHYSICAL EXAM  General: Pleasant, NAD. Neuro: Alert and oriented X 3. Moves all  extremities spontaneously. Psych: Normal affect. HEENT:  Normal  Neck: Supple without bruits or JVD. Lungs:  Resp regular and unlabored, CTA. Heart: RRR no s3, s4, or murmurs. Abdomen: Soft, non-tender, non-distended, BS + x 4.  Extremities: No clubbing, cyanosis or edema. DP/PT/Radials 2+ and equal bilaterally. R radial cath site without erythema, hematoma or bruit.   Accessory Clinical Findings  CBC  Recent Labs  03/20/15 1020 03/21/15 0239  WBC 8.0 6.7  HGB 14.4 13.1  HCT 41.5 38.1*  MCV 90.6 92.0  PLT 216 938   Basic Metabolic Panel  Recent Labs  03/20/15 1020 03/21/15 0239  NA 134* 133*  K 4.3 3.9  CL 104 104  CO2 19* 23  GLUCOSE 377* 251*  BUN 13 11  CREATININE 0.87 0.81  CALCIUM 9.2 8.8*   Cardiac Enzymes  Recent Labs  03/20/15 1612 03/20/15 2040 03/21/15 0239  TROPONINI 4.81* 7.44* 3.83*   Hemoglobin A1C  Recent Labs  03/20/15 1810  HGBA1C 11.9*    TELE  NSR with frequent PVCs.   Radiology/Studies  Dg [facility] [address]  03/20/2015   CLINICAL DATA:  Left-sided chest pain.  Shortness of breath.  EXAM: PORTABLE CHEST - 1 VIEW  COMPARISON:  01/07/2015  FINDINGS: The heart size and mediastinal contours are within normal limits. Linear opacity in both lung bases shows no significant change in may be due to scarring or atelectasis. No evidence of pulmonary consolidation or edema. No evidence of pleural effusion.  IMPRESSION: No significant change in bibasilar scarring versus atelectasis. No acute findings.   Electronically Signed   By: Earle Gell M.D.   On: 03/20/2015 11:22    Cath 03/22/2015 Conclusion    1. Stable Multivessel CAD with known 100% CTO of prox RCA, mild-moderate diffuse ISR of extensively stented Cx & diffuse moderate LAD disease. Also noted (not previously reported) ~90% distal OM3 stenosis in a < 2 mm vessel. 2. Ost Cx to Mid Cx lesion, 35% stenosed. A bare metal stent and drug-eluting stent was placed. The lesion was previously  treated with a bare metal stent, a drug-eluting stent and angioplasty between one and five months ago. 3. Ost 1st Mrg to 1st Mrg lesion, 50% stenosed. Ost 2nd Mrg lesion, 30% stenosed. 3rd Mrg lesion, 90% stenosed. 4. Mid LAD lesion, 50% stenosed. 5. Ost RCA to Prox RCA lesion, 100% stenosed. The lesion was not previously treated. 6. Low normal to mildly reduced LVEF with basal to mid Inferior hypokinesis. 7. There is mild left ventricular systolic dysfunction.  The patient basically has stable coronary disease with no obvious culprit lesion. Initial images suggested possibly that the 90% OM 3 lesion could be a potential culprit lesion, however this was actually seen on previous images from May. It is possible that there was some mild thrombus in the stent segment that was cleared up with subcutaneous Lovenox injection.  Recommendation would be to continue treatment for non-ST elevation MI with adequate granulation. We will repeat start Lovenox 6 hours a TR band removal. Would monitor tonight and if stable overnight after 2 more injections of Lovenox would consider discharge with optimize medical therapy.  Plan for today:  Standard post radial cath TR band removal  Monitor for delayed signs of contrast reaction.  Continue aggressive risk factor modification  Smoking cessation counseling     ASSESSMENT AND PLAN  1. NSTEMI - cath showed stable CAD with no obvious culprit lesion. Initial images suggested possibly that the 90% OM 3 lesion could be a potential culprit lesion, however this was actually seen on previous images from May. Given 2 more injections of Lovenox.  - No further chest pain. Pending Bmet. - Continue ASA, statin, imdur, brilinta, BB, ACE  2. Tobacco abuse - Encouraged cessation. Education given.   3. HTN - relatively stable  4. DM - HgbA1c of 11.9. C - Continue current medications. Plan to f/u with PCP, may need to be seen by  endocrinologist.  [name] PA-C Pager [phone]  Pt seen and examined  Agree with findings of B Bhagat above  Cath rsults as noted  Will continue medical Rx  Make sure pt has f/u as outpt.   [name]

## 2015-03-23 NOTE — Telephone Encounter (Signed)
Rx(s) sent to pharmacy electronically.  

## 2015-03-23 NOTE — Telephone Encounter (Signed)
REFILL 

## 2015-03-23 NOTE — Telephone Encounter (Signed)
New message      TCM appt on 03-31-15 per Vinn

## 2015-03-26 ENCOUNTER — Telehealth: Payer: Self-pay | Admitting: *Deleted

## 2015-03-26 NOTE — Telephone Encounter (Signed)
I spoke with patient and patient does qualify for Dal-Gene Study with genetic profile. The next step is to have patient  continue with screening and draw labs for HGBA1c and CPK . Patient verbalized understanding. I will draw labs for [place] study at post hospital visit on 03/30/17.

## 2015-03-31 ENCOUNTER — Encounter: Payer: Self-pay | Admitting: Nurse Practitioner

## 2015-03-31 ENCOUNTER — Ambulatory Visit (INDEPENDENT_AMBULATORY_CARE_PROVIDER_SITE_OTHER): Payer: Commercial Managed Care - HMO | Admitting: Nurse Practitioner

## 2015-03-31 VITALS — BP 100/58 | HR 86 | Ht 72.0 in | Wt 219.8 lb

## 2015-03-31 DIAGNOSIS — E118 Type 2 diabetes mellitus with unspecified complications: Secondary | ICD-10-CM

## 2015-03-31 DIAGNOSIS — I2510 Atherosclerotic heart disease of native coronary artery without angina pectoris: Secondary | ICD-10-CM

## 2015-03-31 DIAGNOSIS — E7800 Pure hypercholesterolemia, unspecified: Secondary | ICD-10-CM

## 2015-03-31 DIAGNOSIS — I214 Non-ST elevation (NSTEMI) myocardial infarction: Secondary | ICD-10-CM

## 2015-03-31 DIAGNOSIS — I222 Subsequent non-ST elevation (NSTEMI) myocardial infarction: Secondary | ICD-10-CM

## 2015-03-31 DIAGNOSIS — I10 Essential (primary) hypertension: Secondary | ICD-10-CM | POA: Diagnosis not present

## 2015-03-31 DIAGNOSIS — Z794 Long term (current) use of insulin: Secondary | ICD-10-CM

## 2015-03-31 DIAGNOSIS — E1151 Type 2 diabetes mellitus with diabetic peripheral angiopathy without gangrene: Secondary | ICD-10-CM | POA: Insufficient documentation

## 2015-03-31 DIAGNOSIS — Z9861 Coronary angioplasty status: Secondary | ICD-10-CM | POA: Diagnosis not present

## 2015-03-31 DIAGNOSIS — I255 Ischemic cardiomyopathy: Secondary | ICD-10-CM

## 2015-03-31 NOTE — Addendum Note (Signed)
Addended by: Lamar Laundry on: 03/31/2015 10:17 AM   Modules accepted: Orders

## 2015-03-31 NOTE — Progress Notes (Signed)
Patient Name: Robert [name] Date of Encounter: 03/31/2015  Primary Care Provider:  Glo Herring., MD Primary Cardiologist:  Jerilynn Mages. Croitoru, MD  Chief Complaint  64 year old male who presents for follow-up after recent non-ST elevation MI.  Past Medical History   Past Medical History  Diagnosis Date  . Essential hypertension   . Coronary artery disease     a. Multiple LCX interventions dating back to 2004;  b. 03/2015 NSTEMI/Cath: LM nl, LAD 50ost-m, LCX 35 ost-m, OM1 50ost (jailed), OM2 30ost (jailed), OM3 37 (old - ~ 54mm vessel), RCA 100 CTO w/ L->R collats, EF 40-45%.  . PVD, chronic LLE 12/22/2011  . High cholesterol   . Chronic bronchitis   . Type II diabetes mellitus   . Tobacco abuse   . Rheumatoid arthritis(714.0)   . Anxiety   . Ischemic cardiomyopathy     a. 03/2015 EF 40-45% by LV gram.   Past Surgical History  Procedure Laterality Date  . Back surgery    . Nm myocar perf wall motion  08/30/2009    No significant ischemia  . Left heart catheterization with coronary angiogram N/A 12/22/2011    Procedure: LEFT HEART CATHETERIZATION WITH CORONARY ANGIOGRAM;  Surgeon: Troy Sine, MD;  Location: [facility] CATH LAB;  Service: Cardiovascular;  Laterality: N/A;  . Percutaneous coronary intervention-balloon only  12/22/2011    Procedure: PERCUTANEOUS CORONARY INTERVENTION-BALLOON ONLY;  Surgeon: Troy Sine, MD;  Location: [facility] CATH LAB;  Service: Cardiovascular;;  . Left heart catheterization with coronary angiogram Bilateral 05/16/2012    Procedure: LEFT HEART CATHETERIZATION WITH CORONARY ANGIOGRAM;  Surgeon: Lorretta Harp, MD;  Location: [facility] CATH LAB;  Service: Cardiovascular;  Laterality: Bilateral;  . Percutaneous coronary stent intervention (pci-s)  05/16/2012    Procedure: PERCUTANEOUS CORONARY STENT INTERVENTION (PCI-S);  Surgeon: Lorretta Harp, MD;  Location: [facility] CATH LAB;  Service: Cardiovascular;;  . Left heart catheterization with coronary angiogram N/A 11/14/2012    Procedure: LEFT HEART CATHETERIZATION WITH CORONARY ANGIOGRAM;  Surgeon: Troy Sine, MD;  Location: [facility] CATH LAB;  Service: Cardiovascular;  Laterality: N/A;  . Percutaneous coronary intervention-balloon only  11/14/2012    Procedure: PERCUTANEOUS CORONARY INTERVENTION-BALLOON ONLY;  Surgeon: Troy Sine, MD;  Location: [facility] CATH LAB;  Service: Cardiovascular;;  . Left heart catheterization with coronary angiogram N/A 09/01/2013    Procedure: LEFT HEART CATHETERIZATION WITH CORONARY ANGIOGRAM;  Surgeon: Leonie Man, MD;  Location: [facility] CATH LAB;  Service: Cardiovascular;  Laterality: N/A;  . Percutaneous coronary stent intervention (pci-s)  09/01/2013    Procedure: PERCUTANEOUS CORONARY STENT INTERVENTION (PCI-S);  Surgeon: Leonie Man, MD;  Location: [facility] CATH LAB;  Service: Cardiovascular;;  . Coronary angioplasty  11/14/2012; 02/01/2015  . Coronary angioplasty with stent placement  05/16/2012    "1; makes total ~ 4"  . Laparoscopic cholecystectomy  ~1995  . Lumbar laminectomy  1972; 1985  . Cardiac catheterization N/A 02/01/2015    Procedure: Left Heart Cath and Coronary Angiography;  Surgeon: Belva Crome, MD;  Location: [place] CV LAB;  Service: Cardiovascular;  Laterality: N/A;  . Cardiac catheterization N/A 03/22/2015    Procedure: Left Heart Cath and Coronary Angiography;  Surgeon: Leonie Man, MD;  Location: [place] CV LAB;  Service: Cardiovascular;  Laterality: N/A;    Allergies  Allergies  Allergen Reactions  . Ivp Dye [Iodinated Diagnostic Agents] Hives and Itching  . Clopidogrel Bisulfate Rash  . Iodine Rash    Contrast Dye  . Plavix [  Clopidogrel Bisulfate] Rash    HPI  64 year old male with the above complex problem list. He has a long history of coronary artery disease with multiple left circumflex interventions and known chronic total occlusion of the right coronary artery. He also has a history of ischemic cardiomyopathy with an EF previously documented  as low as 35%. In May of this year, he had an abnormal Myoview and subsequent underwent catheterization and repeat percutaneous intervention upon the left circumflex. Unfortunately, he developed recurrent chest discomfort earlier this month and was readmitted to [facility] and ruled in for a non-ST elevation MI. Repeat catheterization demonstrated patency of the left circumflex with a known chronic total occlusion of the RCA and also known, severe third obtuse marginal disease. This was a 2 mm vessel and it was felt that it could be the culprit of his symptoms, it was not felt to be amenable for PCI. His long-acting nitrate dose was increased to 120 mg daily and he was otherwise maintained on aspirin, brilinta, beta blocker, and statin therapy. He was subsequently discharged. Since discharge, he has not had any chest pain or dyspnea on exertion. The only thing that limits his activity is his arthritis. He denies PND, orthopnea, dizziness, syncopal, edema, or early satiety. He has cut back from 2-1/2 packs a day of cigarettes to one and a half currently and has set his birth date, [birth year] as his quit date.  Home Medications  Prior to Admission medications   Medication Sig Start Date End Date Taking? Authorizing Provider  acetaminophen (TYLENOL) 325 MG tablet Take 2 tablets (650 mg total) by mouth every 4 (four) hours as needed for headache or mild pain. 09/03/13  Yes Luke K Kilroy, PA-C  ALPRAZolam Duanne Moron) 0.5 MG tablet Take 0.5 mg by mouth 3 (three) times daily as needed. For anxiety   Yes Historical Provider, MD  aspirin EC 81 MG EC tablet Take 1 tablet (81 mg total) by mouth daily. 11/15/12  Yes Brett Canales, PA-C  atorvastatin (LIPITOR) 80 MG tablet Take 1 tablet (80 mg total) by mouth daily at 6 PM. 03/23/15  Yes Bhavinkumar Bhagat, PA  cetirizine (ZYRTEC) 10 MG tablet Take 10 mg by mouth daily as needed for allergies.   Yes Historical Provider, MD  fish oil-omega-3 fatty acids 1000 MG capsule Take 1  g by mouth daily.    Yes Historical Provider, MD  folic acid (FOLVITE) 1 MG tablet Take 1 mg by mouth daily.    Yes Historical Provider, MD  glipiZIDE (GLUCOTROL XL) 10 MG 24 hr tablet Take 10 mg by mouth 2 (two) times daily.   Yes Historical Provider, MD  hydroxychloroquine (PLAQUENIL) 200 MG tablet Take 200 mg by mouth 2 (two) times daily.   Yes Historical Provider, MD  insulin glargine (LANTUS) 100 UNIT/ML injection Inject 50 Units into the skin at bedtime.    Yes Historical Provider, MD  isosorbide mononitrate (IMDUR) 120 MG 24 hr tablet Take 1 tablet (120 mg total) by mouth daily. 03/23/15  Yes Bhavinkumar Bhagat, PA  metFORMIN (GLUCOPHAGE) 1000 MG tablet Take 1 tablet (1,000 mg total) by mouth 2 (two) times daily with a meal. 03/23/15  Yes Bhavinkumar Bhagat, PA  methotrexate (RHEUMATREX) 2.5 MG tablet Take 25 mg by mouth once a week. Caution:Chemotherapy. Protect from light. ON THURSDAYS   Yes Historical Provider, MD  metoprolol tartrate (LOPRESSOR) 25 MG tablet Take 1 tablet (25 mg total) by mouth 2 (two) times daily. 03/23/15  Yes Mihai Croitoru,  MD  nitroGLYCERIN (NITROSTAT) 0.4 MG SL tablet Place 1 tablet (0.4 mg total) under the tongue every 5 (five) minutes x 3 doses as needed for chest pain. 02/10/15  Yes Brett Canales, PA-C  quinapril (ACCUPRIL) 40 MG tablet Take 0.5 tablets (20 mg total) by mouth at bedtime. 09/15/14  Yes Mihai Croitoru, MD  sulfaSALAzine (AZULFIDINE) 500 MG tablet Take 500 mg by mouth 2 (two) times daily.   Yes Historical Provider, MD  ticagrelor (BRILINTA) 90 MG TABS tablet Take 1 tablet (90 mg total) by mouth 2 (two) times daily. 02/10/15  Yes Brett Canales, PA-C  traMADol (ULTRAM) 50 MG tablet Take 50 mg by mouth every 6 (six) hours as needed for moderate pain.   Yes Historical Provider, MD    Review of Systems  As above, he has been doing well.  He denies chest pain, palpitations, dyspnea, pnd, orthopnea, n, v, dizziness, syncope, edema, weight gain, or early satiety.   All other systems reviewed and are otherwise negative except as noted above.  Physical Exam  VS:  BP 100/58 mmHg  Pulse 86  Ht 6' (1.829 m)  Wt 219 lb 12.8 oz (99.701 kg)  BMI 29.80 kg/m2  SpO2 95% , BMI Body mass index is 29.8 kg/(m^2). GEN: Well nourished, well developed, in no acute distress. HEENT: normal. Neck: Supple, no JVD, carotid bruits, or masses. Cardiac: RRR, no murmurs, rubs, or gallops. No clubbing, cyanosis, edema.  Radials/DP/PT 2+ and equal bilaterally. The right wrist catheterization site is without bleeding, bruit, or hematoma. Respiratory:  Respirations regular and unlabored, clear to auscultation bilaterally. GI: Soft, nontender, nondistended, BS + x 4. MS: no deformity or atrophy. Skin: warm and dry, no rash. Neuro:  Strength and sensation are intact. Psych: Normal affect.  Accessory Clinical Findings  ECG - regular sinus rhythm, 83, old inferior infarct, no acute ST or T changes.  Assessment & Plan  1.  Non-ST elevation MI, subsequent episode of care/coronary artery disease: Status post recent admission and catheterization revealing stable anatomy with known severe third obtuse marginal disease and mild in-stent restenosis within the left circumflex. Medical therapy was recommended as the third obtuse marginal is felt to be too small for intervention. He has not had any chest pain or dyspnea since discharge. He remains on aspirin, statin, nitrates, beta blocker, ACE inhibitor, and Brilinta therapy and is tolerating all well.  2. Ischemic cardiopathy: Most recent EF by LV gram was 40-45%. He is euvolemic on exam and remains on beta blocker and ACE inhibitor therapy.  3. Essential hypertension: Stable on beta blocker and ACE inhibitor therapy.  4. Hyperlipidemia: LDL of 45 in December 2014. He had normal LFTs in May 2016. I will arrange for follow-up fasting lipids.  5. Tobacco abuse: Complete cessation advice. He has cut back from 2-1/2 packs a day to one  half packs per day. He plans to quit on his birthday. I congratulated him for choosing a quit date.  6. Type 2 diabetes mellitus: Patient reports compliance with his medications and says that his sugars have been consistently less than 190 since improving his compliance. He plans to follow up with his primary care.  7. Disposition: Follow-up with Dr. Sallyanne Kuster in 3 months or sooner if necessary.   Murray Hodgkins, NP 03/31/2015, 9:59 AM

## 2015-03-31 NOTE — Patient Instructions (Addendum)
Medication Instructions:  Your physician recommends that you continue on your current medications as directed. Please refer to the Current Medication list given to you today.   Labwork: Your physician recommends that you return for a FASTING lipid profile: asap  Testing/Procedures: None   Follow-Up: Your physician wants you to follow-up in: 3 months with Dr.Croitoru You will receive a reminder letter in the mail two months in advance. If you don't receive a letter, please call our office to schedule the follow-up appointment.   Any Other Special Instructions Will Be Listed Below (If Applicable).

## 2015-04-01 ENCOUNTER — Telehealth: Payer: Self-pay | Admitting: *Deleted

## 2015-04-01 NOTE — Telephone Encounter (Signed)
I contacted patient about lab work for [employer]. CK was 35 and HGA1C was 11.1. I explained to patient the study requires HGA1C below 10. We have until Oct 2 to randomize patient. Patient is following up with primary doctor for diabetes control. We will recheck [facility] on August 25 at 9:30 and patient will have lipids drawn at same time.

## 2015-04-12 DIAGNOSIS — I10 Essential (primary) hypertension: Secondary | ICD-10-CM | POA: Diagnosis not present

## 2015-04-12 DIAGNOSIS — M069 Rheumatoid arthritis, unspecified: Secondary | ICD-10-CM | POA: Diagnosis not present

## 2015-04-12 DIAGNOSIS — Z1389 Encounter for screening for other disorder: Secondary | ICD-10-CM | POA: Diagnosis not present

## 2015-04-12 DIAGNOSIS — E782 Mixed hyperlipidemia: Secondary | ICD-10-CM | POA: Diagnosis not present

## 2015-04-12 DIAGNOSIS — E1165 Type 2 diabetes mellitus with hyperglycemia: Secondary | ICD-10-CM | POA: Diagnosis not present

## 2015-04-12 DIAGNOSIS — E669 Obesity, unspecified: Secondary | ICD-10-CM | POA: Diagnosis not present

## 2015-04-12 DIAGNOSIS — E6609 Other obesity due to excess calories: Secondary | ICD-10-CM | POA: Diagnosis not present

## 2015-04-12 DIAGNOSIS — Z6830 Body mass index (BMI) 30.0-30.9, adult: Secondary | ICD-10-CM | POA: Diagnosis not present

## 2015-04-23 DIAGNOSIS — Z1389 Encounter for screening for other disorder: Secondary | ICD-10-CM | POA: Diagnosis not present

## 2015-04-23 DIAGNOSIS — Z6830 Body mass index (BMI) 30.0-30.9, adult: Secondary | ICD-10-CM | POA: Diagnosis not present

## 2015-04-23 DIAGNOSIS — E6609 Other obesity due to excess calories: Secondary | ICD-10-CM | POA: Diagnosis not present

## 2015-04-23 DIAGNOSIS — M069 Rheumatoid arthritis, unspecified: Secondary | ICD-10-CM | POA: Diagnosis not present

## 2015-04-23 DIAGNOSIS — E119 Type 2 diabetes mellitus without complications: Secondary | ICD-10-CM | POA: Diagnosis not present

## 2015-05-12 ENCOUNTER — Encounter: Payer: Self-pay | Admitting: Cardiovascular Disease

## 2015-06-04 DIAGNOSIS — E1165 Type 2 diabetes mellitus with hyperglycemia: Secondary | ICD-10-CM | POA: Diagnosis not present

## 2015-06-04 DIAGNOSIS — Z6832 Body mass index (BMI) 32.0-32.9, adult: Secondary | ICD-10-CM | POA: Diagnosis not present

## 2015-06-04 DIAGNOSIS — I10 Essential (primary) hypertension: Secondary | ICD-10-CM | POA: Diagnosis not present

## 2015-06-04 DIAGNOSIS — E6609 Other obesity due to excess calories: Secondary | ICD-10-CM | POA: Diagnosis not present

## 2015-06-04 DIAGNOSIS — Z1389 Encounter for screening for other disorder: Secondary | ICD-10-CM | POA: Diagnosis not present

## 2015-06-04 DIAGNOSIS — E782 Mixed hyperlipidemia: Secondary | ICD-10-CM | POA: Diagnosis not present

## 2015-06-04 DIAGNOSIS — I213 ST elevation (STEMI) myocardial infarction of unspecified site: Secondary | ICD-10-CM | POA: Diagnosis not present

## 2015-06-04 DIAGNOSIS — Z23 Encounter for immunization: Secondary | ICD-10-CM | POA: Diagnosis not present

## 2015-06-09 DIAGNOSIS — E6609 Other obesity due to excess calories: Secondary | ICD-10-CM | POA: Diagnosis not present

## 2015-06-09 DIAGNOSIS — J069 Acute upper respiratory infection, unspecified: Secondary | ICD-10-CM | POA: Diagnosis not present

## 2015-06-09 DIAGNOSIS — Z6832 Body mass index (BMI) 32.0-32.9, adult: Secondary | ICD-10-CM | POA: Diagnosis not present

## 2015-06-09 DIAGNOSIS — Z1389 Encounter for screening for other disorder: Secondary | ICD-10-CM | POA: Diagnosis not present

## 2015-06-09 DIAGNOSIS — J441 Chronic obstructive pulmonary disease with (acute) exacerbation: Secondary | ICD-10-CM | POA: Diagnosis not present

## 2015-06-14 ENCOUNTER — Encounter: Payer: Self-pay | Admitting: *Deleted

## 2015-06-14 DIAGNOSIS — Z006 Encounter for examination for normal comparison and control in clinical research program: Secondary | ICD-10-CM

## 2015-06-14 NOTE — Progress Notes (Signed)
  Patient was randomized into the Dal-Gene Study.All elements of the informed consent ,study requirements, and expectations were reviewed with the patient,and all questions and concerns were identified and addressed prior to the signing of the consent. No procedures were performed prior to consenting the patient. The patient was given an adequate amount of time to make an informed decision. A copy of the consent was provided to the patient to take home. 05/11/15 10:30am

## 2015-07-05 ENCOUNTER — Ambulatory Visit (INDEPENDENT_AMBULATORY_CARE_PROVIDER_SITE_OTHER): Payer: Commercial Managed Care - HMO | Admitting: Cardiovascular Disease

## 2015-07-05 ENCOUNTER — Encounter: Payer: Self-pay | Admitting: Cardiovascular Disease

## 2015-07-05 ENCOUNTER — Telehealth: Payer: Self-pay | Admitting: *Deleted

## 2015-07-05 VITALS — BP 144/82 | HR 94 | Ht 72.0 in | Wt 237.0 lb

## 2015-07-05 DIAGNOSIS — E785 Hyperlipidemia, unspecified: Secondary | ICD-10-CM

## 2015-07-05 DIAGNOSIS — I255 Ischemic cardiomyopathy: Secondary | ICD-10-CM

## 2015-07-05 DIAGNOSIS — I25118 Atherosclerotic heart disease of native coronary artery with other forms of angina pectoris: Secondary | ICD-10-CM

## 2015-07-05 DIAGNOSIS — I214 Non-ST elevation (NSTEMI) myocardial infarction: Secondary | ICD-10-CM

## 2015-07-05 DIAGNOSIS — Z9861 Coronary angioplasty status: Secondary | ICD-10-CM

## 2015-07-05 DIAGNOSIS — I2510 Atherosclerotic heart disease of native coronary artery without angina pectoris: Secondary | ICD-10-CM

## 2015-07-05 DIAGNOSIS — I10 Essential (primary) hypertension: Secondary | ICD-10-CM

## 2015-07-05 DIAGNOSIS — J449 Chronic obstructive pulmonary disease, unspecified: Secondary | ICD-10-CM

## 2015-07-05 MED ORDER — TICAGRELOR 90 MG PO TABS: 90.0000 mg | ORAL_TABLET | Freq: Two times a day (BID) | ORAL | Status: DC

## 2015-07-05 NOTE — Telephone Encounter (Signed)
Patient notified I will be sending an application for help getting free Brilinta.  We will give samples (if available) until this is approved.

## 2015-07-05 NOTE — Patient Instructions (Addendum)
Dr. Sallyanne Kuster recommends that you schedule a follow-up appointment in: 6 months  [place].

## 2015-07-05 NOTE — Progress Notes (Signed)
Patient ID: Robert [name], male   DOB: 11/28/1950, 64 y.o.   MRN: [id number]      Cardiology Office Note   Date:  07/05/2015   ID:  Robert [name], DOB 1951/03/31, MRN [id number]  PCP:  Glo Herring., MD  Cardiologist:   Sanda Klein, MD   Chief Complaint  Patient presents with  . 3 months  . Dizziness  . Shortness of Breath    DUE TO DIABETES  . Edema    DUE TO ARTHRITIS      History of Present Illness: Robert [name] is a 64 y.o. male who presents for  Follow-up for CAD with frequent percutaneous revascularization for recurrent in-stent restenosis and de novo lesions in the left circumflex coronary artery.  His most recent intervention was in May 2016 when he underwent cutting balloon angioplasty for in-stent restenosis.  He was readmitted in July for non-STEMI , but repeat cardiac catheterization did not show any new targets for intervention. He has chronic total occlusion of the right coronary artery, multiple stents in the left circumflex coronary artery and severe disease in the third obtuse marginal branch, too small for intervention. He was very congratulatory for the care he received from Dr. Glenetta Hew. Since then he was randomized into the Dal-Gene Study (dalcetrapib vs. Placebo).  He does not have angina pectoris. Another very positive development is the fact that he has completely quit smoking for approximately 6 weeks. His wife and son who live in the same household have also quit smoking [employer].  He does not have any edema or other clear signs of congestive heart failure. He has moderately depressed left ventricular systolic function , most recently estimated EF equals 40-45% by LV angiography at repeat cardiac catheterization in July 2016 (no intervention performed at that time).  He has chronic shortness of breath , NYHA functional class II-III and a chronic productive cough consistent with COPD.   summary of coronary  interventions: -cath 2004 showed total occlusion of RCA and intermediate lesions LAD, high grade mid LCX treated with 4x18 mm BMS (Velocity).  - 2006 overlapping 3.5x23 mm Cypher inLCX  - 2007 new downstream stenosis at OM bifurcation treated with 3x18 mm Cypher stent  - 2008 acute stent thrombosis (allergic to plavix, temporarily stopped Ticlid) - thrombolysis/aspiration only  - April 2013 cutting balloon angioplasty for proximal LCX in stent restenosis (Dr. Claiborne Billings)  - September 2013 repeat restenosis treated with "stent in stent" Xience Expedition 3.25 x 23 mm (Dr. Gwenlyn Found).  - March 2014 left circumflex coronary artery PCI for restenosis, utilizing cutting balloon atherotomy/noncompliant balloon dilatation  - December 2014  Thrombotic occlusion of restenotic proximal left circumflex stent , new Promus Premier DES 3.5 mm x 24 mm; overlapping the proximal and distal stents -  May 2016 cutting balloon angioplasty for in-stent restenosis in the proximal left circumflex coronary artery -  July 2016 non STEMI ,  coronary angiogram unchanged,no intervention performed -  No change in RCA or LAD anatomy during last several years.  -  Allergic to iodinated contrast, but did well with premedication. Allergic to clopidogrel but tolerates Brilinta and Effient well.  Past Medical History  Diagnosis Date  . Essential hypertension   . Coronary artery disease     a. Multiple LCX interventions dating back to 2004;  b. 03/2015 NSTEMI/Cath: LM nl, LAD 50ost-m, LCX 35 ost-m, OM1 50ost (jailed), OM2 30ost (jailed), OM3 48 (old - ~ 59mm vessel), RCA 100 CTO w/  L->R collats, EF 40-45%.  . PVD, chronic LLE 12/22/2011  . High cholesterol   . Chronic bronchitis ([place])   . Type II diabetes mellitus ([place])   . Tobacco abuse   . Rheumatoid arthritis(714.0)   . Anxiety   . Ischemic cardiomyopathy     a. 03/2015 EF 40-45% by LV gram.    Past Surgical History  Procedure Laterality Date  . Back surgery    . Nm  myocar perf wall motion  08/30/2009    No significant ischemia  . Left heart catheterization with coronary angiogram N/A 12/22/2011    Procedure: LEFT HEART CATHETERIZATION WITH CORONARY ANGIOGRAM;  Surgeon: Troy Sine, MD;  Location: [facility] CATH LAB;  Service: Cardiovascular;  Laterality: N/A;  . Percutaneous coronary intervention-balloon only  12/22/2011    Procedure: PERCUTANEOUS CORONARY INTERVENTION-BALLOON ONLY;  Surgeon: Troy Sine, MD;  Location: [facility] CATH LAB;  Service: Cardiovascular;;  . Left heart catheterization with coronary angiogram Bilateral 05/16/2012    Procedure: LEFT HEART CATHETERIZATION WITH CORONARY ANGIOGRAM;  Surgeon: Lorretta Harp, MD;  Location: [facility] CATH LAB;  Service: Cardiovascular;  Laterality: Bilateral;  . Percutaneous coronary stent intervention (pci-s)  05/16/2012    Procedure: PERCUTANEOUS CORONARY STENT INTERVENTION (PCI-S);  Surgeon: Lorretta Harp, MD;  Location[facility] CATH LAB;  Service: Cardiovascular;;  . Left heart catheterization with coronary angiogram N/A 11/14/2012    Procedure: LEFT HEART CATHETERIZATION WITH CORONARY ANGIOGRAM;  Surgeon: Troy Sine, MD;  Location: [facility] CATH LAB;  Service: Cardiovascular;  Laterality: N/A;  . Percutaneous coronary intervention-balloon only  11/14/2012    Procedure: PERCUTANEOUS CORONARY INTERVENTION-BALLOON ONLY;  Surgeon: Troy Sine, MD;  Location: [facility] CATH LAB;  Service: Cardiovascular;;  . Left heart catheterization with coronary angiogram N/A 09/01/2013    Procedure: LEFT HEART CATHETERIZATION WITH CORONARY ANGIOGRAM;  Surgeon: Leonie Man, MD;  Location: [facility] CATH LAB;  Service: Cardiovascular;  Laterality: N/A;  . Percutaneous coronary stent intervention (pci-s)  09/01/2013    Procedure: PERCUTANEOUS CORONARY STENT INTERVENTION (PCI-S);  Surgeon: Leonie Man, MD;  Location: [facility] CATH LAB;  Service: Cardiovascular;;  . Coronary angioplasty  11/14/2012; 02/01/2015  . Coronary angioplasty with stent placement   05/16/2012    "1; makes total ~ 4"  . Laparoscopic cholecystectomy  ~1995  . Lumbar laminectomy  1972; 1985  . Cardiac catheterization N/A 02/01/2015    Procedure: Left Heart Cath and Coronary Angiography;  Surgeon: Belva Crome, MD;  Location: [place] CV LAB;  Service: Cardiovascular;  Laterality: N/A;  . Cardiac catheterization N/A 03/22/2015    Procedure: Left Heart Cath and Coronary Angiography;  Surgeon: Leonie Man, MD;  Location: [place] CV LAB;  Service: Cardiovascular;  Laterality: N/A;     Current Outpatient Prescriptions  Medication Sig Dispense Refill  . acetaminophen (TYLENOL) 325 MG tablet Take 2 tablets (650 mg total) by mouth every 4 (four) hours as needed for headache or mild pain.    [name] ALPRAZolam (XANAX) 0.5 MG tablet Take 0.5 mg by mouth 3 (three) times daily as needed. For anxiety    . aspirin EC 81 MG EC tablet Take 1 tablet (81 mg total) by mouth daily.    [name] atorvastatin (LIPITOR) 80 MG tablet Take 1 tablet (80 mg total) by mouth daily at 6 PM. 30 tablet 11  . cetirizine (ZYRTEC) 10 MG tablet Take 10 mg by mouth daily as needed for allergies.    . fish oil-omega-3 fatty acids 1000 MG capsule Take  1 g by mouth daily.     . folic acid (FOLVITE) 1 MG tablet Take 1 mg by mouth daily.     [name] glipiZIDE (GLUCOTROL XL) 10 MG 24 hr tablet Take 10 mg by mouth 2 (two) times daily.    . hydroxychloroquine (PLAQUENIL) 200 MG tablet Take 200 mg by mouth 2 (two) times daily.    . insulin glargine (LANTUS) 100 UNIT/ML injection Inject 50 Units into the skin at bedtime.     . isosorbide mononitrate (IMDUR) 120 MG 24 hr tablet Take 1 tablet (120 mg total) by mouth daily. 30 tablet 6  . metFORMIN (GLUCOPHAGE) 1000 MG tablet Take 1 tablet (1,000 mg total) by mouth 2 (two) times daily with a meal.    . methotrexate (RHEUMATREX) 2.5 MG tablet Take 25 mg by mouth once a week. Caution:Chemotherapy. Protect from light. ON THURSDAYS    . metoprolol tartrate (LOPRESSOR) 25 MG tablet Take  1 tablet (25 mg total) by mouth 2 (two) times daily. 180 tablet 0  . nitroGLYCERIN (NITROSTAT) 0.4 MG SL tablet Place 1 tablet (0.4 mg total) under the tongue every 5 (five) minutes x 3 doses as needed for chest pain. 25 tablet 2  . quinapril (ACCUPRIL) 40 MG tablet Take 0.5 tablets (20 mg total) by mouth at bedtime. 45 tablet 2  . sulfaSALAzine (AZULFIDINE) 500 MG tablet Take 500 mg by mouth 2 (two) times daily.    . ticagrelor (BRILINTA) 90 MG TABS tablet Take 1 tablet (90 mg total) by mouth 2 (two) times daily. 48 tablet 0  . traMADol (ULTRAM) 50 MG tablet Take 50 mg by mouth every 6 (six) hours as needed for moderate pain.     No current facility-administered medications for this visit.    Allergies:   Ivp dye; Clopidogrel bisulfate; Iodine; and Plavix    Social History:  The patient  reports that he has been smoking Cigarettes.  He has a 30.75 pack-year smoking history. He has never used smokeless tobacco. He reports that he does not drink alcohol or use illicit drugs.   Family History:  The patient's family history includes Cancer in his mother; Colon cancer in his father; Heart disease in his mother; Hypertension in his mother.    ROS:  Please see the history of present illness.    Otherwise, review of systems positive for pain in multiple joints, related to rheumatoid arthritis.   All other systems are reviewed and negative.    PHYSICAL EXAM: VS:  BP 144/82 mmHg  Pulse 94  Ht 6' (1.829 m)  Wt 237 lb (107.502 kg)  BMI 32.14 kg/m2 , BMI Body mass index is 32.14 kg/(m^2).  General: Alert, oriented x3, no distress Head: no evidence of trauma, PERRL, EOMI, no exophtalmos or lid lag, no myxedema, no xanthelasma; normal ears, nose and oropharynx Neck: normal jugular venous pulsations and no hepatojugular reflux; brisk carotid pulses without delay and no carotid bruits Chest:  Diffusely decreased breath sounds, otherwiseclear to auscultation, no signs of consolidation by percussion  or palpation, normal fremitus, symmetrical and full respiratory excursions Cardiovascular: normal position and quality of the apical impulse, regular rhythm, normal first and second heart sounds, no murmurs, rubs or gallops Abdomen: no tenderness or distention, no masses by palpation, no abnormal pulsatility or arterial bruits, normal bowel sounds, no hepatosplenomegaly Extremities: no clubbing, cyanosis or edema; 2+ radial, ulnar and brachial pulses bilaterally; 2+ right femoral, posterior tibial and dorsalis pedis pulses; 2+ left femoral, posterior tibial and dorsalis  pedis pulses; no subclavian or femoral bruits Neurological: grossly nonfocal Psych: euthymic mood, full affect   EKG:  EKG is not ordered today.   Recent Labs: 01/27/2015: ALT 14 03/21/2015: Hemoglobin 13.1; Platelets 200 03/23/2015: BUN 8; Creatinine, Ser 0.69; Potassium 3.4*; Sodium 136    Lipid Panel    Component Value Date/Time   CHOL 138 09/02/2013 0223   TRIG 322* 09/02/2013 0223   HDL 29* 09/02/2013 0223   CHOLHDL 4.8 09/02/2013 0223   VLDL 64* 09/02/2013 0223   LDLCALC 45 09/02/2013 0223      Wt Readings from Last 3 Encounters:  07/05/15 237 lb (107.502 kg)  03/31/15 219 lb 12.8 oz (99.701 kg)  03/23/15 223 lb 9.6 oz (101.424 kg)      ASSESSMENT AND PLAN:  CAD chronic total occlusion of the right coronary artery and multiple repeat interventions in the left circumflex coronary artery for in-stent restenosis , most recently May 2016  I'm hopeful that smoking cessation make a difference in the reoccurrence of in-stent restenosis, although the likelihood of this remains high. Currently no symptoms of coronary insufficiency. Plan to continue dual antiplatelet therapy indefinitely. We'll try to obtain financial assistance for Brilinta.  Tobacco abuse  for the first time in his life he has successfully quit smoking , now for about 6 weeks  Dyslipidemia- low HDL  recently switched to atorvastatin , plan to  recheck lipids in the next several weeks. Gemfibrozil was stopped. It had not had much of a positive impact on his HDL. He is now on a trial of Dalcetrapib (blinded).  PVD, chronic LLE Not currently symptomatic, although his breathing limits activity and may be covering up intermittent claudication.  DM type 2 causing vascular disease, not at goal  last hemoglobin A1c 11.9% July. Unfortunately smoking cessation may make this worse.   Current medicines are reviewed at length with the patient today.  The patient does not have concerns regarding medicines.  The following changes have been made:  no change  Labs/ tests ordered today include:  No orders of the defined types were placed in this encounter.    Patient Instructions  Dr. Sallyanne Kuster recommends that you schedule a follow-up appointment in: 6 months  [place].   Mikael Spray, MD  07/05/2015 8:07 PM    Sanda Klein, MD, [facility] [phone] office [phone] pager

## 2015-07-06 ENCOUNTER — Telehealth: Payer: Self-pay | Admitting: *Deleted

## 2015-07-06 DIAGNOSIS — E785 Hyperlipidemia, unspecified: Secondary | ICD-10-CM

## 2015-07-06 DIAGNOSIS — E119 Type 2 diabetes mellitus without complications: Secondary | ICD-10-CM

## 2015-07-06 NOTE — Telephone Encounter (Signed)
-----   Message from Sanda Klein, MD sent at 07/05/2015  8:30 PM EDT ----- Please order lipids and A1c in 4-6 weeks.

## 2015-07-06 NOTE — Telephone Encounter (Signed)
Order placed for Lipid and A1C to be done in 5 weeks.  Mailed order to patient.

## 2015-07-15 DIAGNOSIS — M0579 Rheumatoid arthritis with rheumatoid factor of multiple sites without organ or systems involvement: Secondary | ICD-10-CM | POA: Diagnosis not present

## 2015-08-03 DIAGNOSIS — Z6832 Body mass index (BMI) 32.0-32.9, adult: Secondary | ICD-10-CM | POA: Diagnosis not present

## 2015-08-03 DIAGNOSIS — Z1389 Encounter for screening for other disorder: Secondary | ICD-10-CM | POA: Diagnosis not present

## 2015-08-03 DIAGNOSIS — I2510 Atherosclerotic heart disease of native coronary artery without angina pectoris: Secondary | ICD-10-CM | POA: Diagnosis not present

## 2015-08-03 DIAGNOSIS — E782 Mixed hyperlipidemia: Secondary | ICD-10-CM | POA: Diagnosis not present

## 2015-08-03 DIAGNOSIS — E1165 Type 2 diabetes mellitus with hyperglycemia: Secondary | ICD-10-CM | POA: Diagnosis not present

## 2015-08-03 DIAGNOSIS — I10 Essential (primary) hypertension: Secondary | ICD-10-CM | POA: Diagnosis not present

## 2015-08-03 DIAGNOSIS — E6609 Other obesity due to excess calories: Secondary | ICD-10-CM | POA: Diagnosis not present

## 2015-08-03 DIAGNOSIS — I779 Disorder of arteries and arterioles, unspecified: Secondary | ICD-10-CM | POA: Diagnosis not present

## 2015-08-10 DIAGNOSIS — E6609 Other obesity due to excess calories: Secondary | ICD-10-CM | POA: Diagnosis not present

## 2015-08-10 DIAGNOSIS — Z6832 Body mass index (BMI) 32.0-32.9, adult: Secondary | ICD-10-CM | POA: Diagnosis not present

## 2015-08-10 DIAGNOSIS — E782 Mixed hyperlipidemia: Secondary | ICD-10-CM | POA: Diagnosis not present

## 2015-08-10 DIAGNOSIS — E1165 Type 2 diabetes mellitus with hyperglycemia: Secondary | ICD-10-CM | POA: Diagnosis not present

## 2015-08-10 DIAGNOSIS — I779 Disorder of arteries and arterioles, unspecified: Secondary | ICD-10-CM | POA: Diagnosis not present

## 2015-08-10 DIAGNOSIS — Z1389 Encounter for screening for other disorder: Secondary | ICD-10-CM | POA: Diagnosis not present

## 2015-08-10 DIAGNOSIS — I2510 Atherosclerotic heart disease of native coronary artery without angina pectoris: Secondary | ICD-10-CM | POA: Diagnosis not present

## 2015-08-10 DIAGNOSIS — I10 Essential (primary) hypertension: Secondary | ICD-10-CM | POA: Diagnosis not present

## 2015-09-13 ENCOUNTER — Other Ambulatory Visit: Payer: Self-pay | Admitting: Cardiovascular Disease

## 2015-09-14 NOTE — Telephone Encounter (Signed)
Rx request sent to pharmacy.  

## 2015-10-15 DIAGNOSIS — M0579 Rheumatoid arthritis with rheumatoid factor of multiple sites without organ or systems involvement: Secondary | ICD-10-CM | POA: Diagnosis not present

## 2015-10-15 DIAGNOSIS — M2550 Pain in unspecified joint: Secondary | ICD-10-CM | POA: Diagnosis not present

## 2015-10-15 DIAGNOSIS — Z79899 Other long term (current) drug therapy: Secondary | ICD-10-CM | POA: Diagnosis not present

## 2015-11-03 ENCOUNTER — Encounter: Payer: Self-pay | Admitting: *Deleted

## 2015-11-03 DIAGNOSIS — Z006 Encounter for examination for normal comparison and control in clinical research program: Secondary | ICD-10-CM

## 2015-11-03 NOTE — Progress Notes (Signed)
Patient was seen for 6 -month Dal-gene visit. Patient is doing well with study medications and is compliant with study meds. I issued new study medications and will see patient in 6 months. Patient is having exacerbation of rheumatoid arthritis and is following with Dr Sharrell Ku. Labs were drawn per study protocol.

## 2015-11-10 DIAGNOSIS — E119 Type 2 diabetes mellitus without complications: Secondary | ICD-10-CM | POA: Diagnosis not present

## 2015-11-10 DIAGNOSIS — Z1389 Encounter for screening for other disorder: Secondary | ICD-10-CM | POA: Diagnosis not present

## 2015-11-10 DIAGNOSIS — Z0000 Encounter for general adult medical examination without abnormal findings: Secondary | ICD-10-CM | POA: Diagnosis not present

## 2015-11-10 DIAGNOSIS — Z0001 Encounter for general adult medical examination with abnormal findings: Secondary | ICD-10-CM | POA: Diagnosis not present

## 2015-11-10 DIAGNOSIS — Z6832 Body mass index (BMI) 32.0-32.9, adult: Secondary | ICD-10-CM | POA: Diagnosis not present

## 2015-11-10 DIAGNOSIS — E6609 Other obesity due to excess calories: Secondary | ICD-10-CM | POA: Diagnosis not present

## 2015-11-12 ENCOUNTER — Telehealth: Payer: Self-pay | Admitting: *Deleted

## 2015-11-12 NOTE — Telephone Encounter (Signed)
I called patient to let him know his labs were abnormal. Hemoglobin A1c was 10.1 from 8.5 on 05/12/15. Triglycerides were 595 from 142 on 05/12/15. Dr. Irish Lack reviewed labs and wants patient to follow up with primary doctor for diabetes management. Patient said he just saw primary doctor yesterday and Januvia was started and insulin changed. I reminded patient to watch his carbohydrate intake. I will fax labs to Dr. Gerarda Fraction. Patient has just completed steroids for exacerbation of rheumatoid arthritis. Patient will continue to follow-up with primary doctor for management of diabetes.

## 2015-11-15 ENCOUNTER — Other Ambulatory Visit: Payer: Self-pay | Admitting: Cardiovascular Disease

## 2015-11-15 NOTE — Telephone Encounter (Signed)
Rx(s) sent to pharmacy electronically.  

## 2015-12-31 ENCOUNTER — Encounter: Payer: Self-pay | Admitting: Cardiovascular Disease

## 2016-01-12 DIAGNOSIS — Z79899 Other long term (current) drug therapy: Secondary | ICD-10-CM | POA: Diagnosis not present

## 2016-01-12 DIAGNOSIS — M0579 Rheumatoid arthritis with rheumatoid factor of multiple sites without organ or systems involvement: Secondary | ICD-10-CM | POA: Diagnosis not present

## 2016-01-12 DIAGNOSIS — M5441 Lumbago with sciatica, right side: Secondary | ICD-10-CM | POA: Diagnosis not present

## 2016-01-12 DIAGNOSIS — M2550 Pain in unspecified joint: Secondary | ICD-10-CM | POA: Diagnosis not present

## 2016-01-26 DIAGNOSIS — Z6833 Body mass index (BMI) 33.0-33.9, adult: Secondary | ICD-10-CM | POA: Diagnosis not present

## 2016-01-26 DIAGNOSIS — E782 Mixed hyperlipidemia: Secondary | ICD-10-CM | POA: Diagnosis not present

## 2016-01-26 DIAGNOSIS — I10 Essential (primary) hypertension: Secondary | ICD-10-CM | POA: Diagnosis not present

## 2016-01-26 DIAGNOSIS — Z1389 Encounter for screening for other disorder: Secondary | ICD-10-CM | POA: Diagnosis not present

## 2016-01-26 DIAGNOSIS — E119 Type 2 diabetes mellitus without complications: Secondary | ICD-10-CM | POA: Diagnosis not present

## 2016-01-31 ENCOUNTER — Other Ambulatory Visit: Payer: Self-pay | Admitting: *Deleted

## 2016-01-31 MED ORDER — ATORVASTATIN CALCIUM 80 MG PO TABS: 80.0000 mg | ORAL_TABLET | Freq: Every day | ORAL | Status: DC

## 2016-01-31 NOTE — Telephone Encounter (Signed)
Rx has been sent to the pharmacy electronically. ° °

## 2016-02-01 ENCOUNTER — Other Ambulatory Visit: Payer: Self-pay | Admitting: Cardiovascular Disease

## 2016-02-01 NOTE — Telephone Encounter (Signed)
Rx(s) sent to pharmacy electronically.  

## 2016-02-15 DIAGNOSIS — Z6832 Body mass index (BMI) 32.0-32.9, adult: Secondary | ICD-10-CM | POA: Diagnosis not present

## 2016-02-15 DIAGNOSIS — Z1389 Encounter for screening for other disorder: Secondary | ICD-10-CM | POA: Diagnosis not present

## 2016-02-15 DIAGNOSIS — I2510 Atherosclerotic heart disease of native coronary artery without angina pectoris: Secondary | ICD-10-CM | POA: Diagnosis not present

## 2016-02-15 DIAGNOSIS — M0689 Other specified rheumatoid arthritis, multiple sites: Secondary | ICD-10-CM | POA: Diagnosis not present

## 2016-02-15 DIAGNOSIS — E782 Mixed hyperlipidemia: Secondary | ICD-10-CM | POA: Diagnosis not present

## 2016-02-15 DIAGNOSIS — E1165 Type 2 diabetes mellitus with hyperglycemia: Secondary | ICD-10-CM | POA: Diagnosis not present

## 2016-02-15 DIAGNOSIS — J449 Chronic obstructive pulmonary disease, unspecified: Secondary | ICD-10-CM | POA: Diagnosis not present

## 2016-02-19 ENCOUNTER — Other Ambulatory Visit: Payer: Self-pay | Admitting: Physician Assistant

## 2016-03-15 DIAGNOSIS — Z6833 Body mass index (BMI) 33.0-33.9, adult: Secondary | ICD-10-CM | POA: Diagnosis not present

## 2016-03-15 DIAGNOSIS — G2581 Restless legs syndrome: Secondary | ICD-10-CM | POA: Diagnosis not present

## 2016-03-15 DIAGNOSIS — I10 Essential (primary) hypertension: Secondary | ICD-10-CM | POA: Diagnosis not present

## 2016-03-15 DIAGNOSIS — E119 Type 2 diabetes mellitus without complications: Secondary | ICD-10-CM | POA: Diagnosis not present

## 2016-03-29 DIAGNOSIS — K449 Diaphragmatic hernia without obstruction or gangrene: Secondary | ICD-10-CM | POA: Diagnosis not present

## 2016-03-29 DIAGNOSIS — K219 Gastro-esophageal reflux disease without esophagitis: Secondary | ICD-10-CM | POA: Diagnosis not present

## 2016-03-29 DIAGNOSIS — K224 Dyskinesia of esophagus: Secondary | ICD-10-CM | POA: Diagnosis not present

## 2016-04-03 DIAGNOSIS — D235 Other benign neoplasm of skin of trunk: Secondary | ICD-10-CM | POA: Diagnosis not present

## 2016-04-03 DIAGNOSIS — L821 Other seborrheic keratosis: Secondary | ICD-10-CM | POA: Diagnosis not present

## 2016-04-03 DIAGNOSIS — L570 Actinic keratosis: Secondary | ICD-10-CM | POA: Diagnosis not present

## 2016-04-03 DIAGNOSIS — L814 Other melanin hyperpigmentation: Secondary | ICD-10-CM | POA: Diagnosis not present

## 2016-04-10 ENCOUNTER — Other Ambulatory Visit: Payer: Self-pay | Admitting: Cardiovascular Disease

## 2016-04-11 NOTE — Telephone Encounter (Signed)
Rx sent to pharmacy   

## 2016-04-12 ENCOUNTER — Other Ambulatory Visit: Payer: Self-pay | Admitting: Cardiovascular Disease

## 2016-04-12 NOTE — Telephone Encounter (Signed)
Rx(s) sent to pharmacy electronically.  

## 2016-04-18 ENCOUNTER — Telehealth: Payer: Self-pay | Admitting: Cardiovascular Disease

## 2016-04-18 ENCOUNTER — Other Ambulatory Visit: Payer: Self-pay | Admitting: Surgery

## 2016-04-18 DIAGNOSIS — K420 Umbilical hernia with obstruction, without gangrene: Secondary | ICD-10-CM | POA: Diagnosis not present

## 2016-04-18 DIAGNOSIS — K4020 Bilateral inguinal hernia, without obstruction or gangrene, not specified as recurrent: Secondary | ICD-10-CM | POA: Diagnosis not present

## 2016-04-18 NOTE — Telephone Encounter (Signed)
Received records from [facility] for appointment with Dr Sallyanne Kuster on 04/24/16.  Records given to [facility] (medical records) for Dr Croitoru's schedule on 04/24/16. lp

## 2016-04-19 ENCOUNTER — Telehealth: Payer: Self-pay

## 2016-04-19 NOTE — Telephone Encounter (Signed)
Request for surgical clearance:   1. What type surgery is being performed? Bilateral laparoscopic inguinal hernia repair and open umbilical hernia repair  2. When is this surgery scheduled? pending  3. Are there any medications that need to be held prior to surgery and how long? ASA 81 QD, Brilinta 90 BID  4. Name of the physician performing surgery: Dr Coralie Keens at [facility]  5. What is the office phone and fax number?   Phone [phone]   Fax [phone]

## 2016-04-24 ENCOUNTER — Ambulatory Visit (INDEPENDENT_AMBULATORY_CARE_PROVIDER_SITE_OTHER): Payer: Commercial Managed Care - HMO | Admitting: Cardiovascular Disease

## 2016-04-24 ENCOUNTER — Encounter: Payer: Self-pay | Admitting: Cardiovascular Disease

## 2016-04-24 ENCOUNTER — Encounter (INDEPENDENT_AMBULATORY_CARE_PROVIDER_SITE_OTHER): Payer: Self-pay

## 2016-04-24 VITALS — BP 134/80 | HR 79 | Ht 72.0 in | Wt 245.0 lb

## 2016-04-24 DIAGNOSIS — I5042 Chronic combined systolic (congestive) and diastolic (congestive) heart failure: Secondary | ICD-10-CM

## 2016-04-24 DIAGNOSIS — J449 Chronic obstructive pulmonary disease, unspecified: Secondary | ICD-10-CM | POA: Diagnosis not present

## 2016-04-24 DIAGNOSIS — I2510 Atherosclerotic heart disease of native coronary artery without angina pectoris: Secondary | ICD-10-CM | POA: Diagnosis not present

## 2016-04-24 DIAGNOSIS — E785 Hyperlipidemia, unspecified: Secondary | ICD-10-CM | POA: Diagnosis not present

## 2016-04-24 DIAGNOSIS — Z01810 Encounter for preprocedural cardiovascular examination: Secondary | ICD-10-CM

## 2016-04-24 DIAGNOSIS — I10 Essential (primary) hypertension: Secondary | ICD-10-CM

## 2016-04-24 MED ORDER — ISOSORBIDE MONONITRATE ER 120 MG PO TB24: 120.0000 mg | ORAL_TABLET | Freq: Every day | ORAL | 3 refills | Status: DC

## 2016-04-24 MED ORDER — NITROGLYCERIN 0.4 MG SL SUBL: 0.4000 mg | SUBLINGUAL_TABLET | SUBLINGUAL | 2 refills | Status: AC | PRN

## 2016-04-24 NOTE — Progress Notes (Signed)
Cardiology Office Note    Date:  04/24/2016   ID:  Robert [name], DOB 19-Feb-1951, MRN [id number]  PCP:  Glo Herring., MD  Cardiologist:   Sanda Klein, MD   Chief complaint: CAD   History of Present Illness:  Robert [name] is a 65 y.o. male with premature onset coronary artery disease and frequent problems with in-stent restenosis after multiple interventions in the left circumflex coronary artery. His last Percocet intervention was in May 2016, now roughly 15 months, the longest period by far that he has gone without restenosis. The difference may be the fact that he has completely quit smoking for the first time in decades. He also has known chronic total occlusion of the right coronary artery. He is enrolled in the Dal-Gene study (dalcetrapib vs. Placebo).  He is free of angina pectoris. He denies exertional dyspnea with usual activity, but does have shortness of breath if he has to walk fast or walk uphill. He denies leg edema. He has rare wheezing and wheezing but does not sputum production or hemoptysis. He complains about the very high cost of Brilinta.  He is now taking Actemra for rheumatoid arthritis and this has had a remarkably positive effect on his joint symptoms. He has gained 9 pounds since quitting smoking. He is planning to undergo surgical repair of all 3 abdominal surgeries with Dr. Ninfa Linden.   summary of coronary interventions: -cath 2004 showed total occlusion of RCA and intermediate lesions LAD, high grade mid LCX treated with 4x18 mm BMS (Velocity).  - 2006 overlapping 3.5x23 mm Cypher inLCX  - 2007 new downstream stenosis at OM bifurcation treated with 3x18 mm Cypher stent  - 2008 acute stent thrombosis (allergic to plavix, temporarily stopped Ticlid) - thrombolysis/aspiration only  - April 2013 cutting balloon angioplasty for proximal LCX in stent restenosis (Dr. Claiborne Billings)  - September 2013 repeat restenosis treated with "stent in stent" Xience  Expedition 3.25 x 23 mm (Dr. Gwenlyn Found).  - March 2014 left circumflex coronary artery PCI for restenosis, utilizing cutting balloon atherotomy/noncompliant balloon dilatation  - December 2014  Thrombotic occlusion of restenotic proximal left circumflex stent , new Promus Premier DES 3.5 mm x 24 mm; overlapping the proximal and distal stents -  May 2016 cutting balloon angioplasty for in-stent restenosis in the proximal left circumflex coronary artery -  July 2016 non STEMI ,  coronary angiogram unchanged,no intervention performed -  No change in RCA or LAD anatomy during last several years.  -  Allergic to iodinated contrast, but did well with premedication. Allergic to clopidogrel but tolerates Brilinta and Effient well.   Past Medical History:  Diagnosis Date  . Anxiety   . Chronic bronchitis ([place])   . Coronary artery disease    a. Multiple LCX interventions dating back to 2004;  b. 03/2015 NSTEMI/Cath: LM nl, LAD 50ost-m, LCX 35 ost-m, OM1 50ost (jailed), OM2 30ost (jailed), OM3 33 (old - ~ 72mm vessel), RCA 100 CTO w/ L->R collats, EF 40-45%.  . Essential hypertension   . High cholesterol   . Ischemic cardiomyopathy    a. 03/2015 EF 40-45% by LV gram.  . PVD, chronic LLE 12/22/2011  . Rheumatoid arthritis(714.0)   . Tobacco abuse   . Type II diabetes mellitus ([place])     Past Surgical History:  Procedure Laterality Date  . BACK SURGERY    . CARDIAC CATHETERIZATION N/A 02/01/2015   Procedure: Left Heart Cath and Coronary Angiography;  Surgeon: Belva Crome, MD;  Location:  [facility];  Service: Cardiovascular;  Laterality: N/A;  . CARDIAC CATHETERIZATION N/A 03/22/2015   Procedure: Left Heart Cath and Coronary Angiography;  Surgeon: Leonie Man, MD;  Location: [facility];  Service: Cardiovascular;  Laterality: N/A;  . CORONARY ANGIOPLASTY  11/14/2012; 02/01/2015  . CORONARY ANGIOPLASTY WITH STENT PLACEMENT  05/16/2012   "1; makes total ~ 4"  . LAPAROSCOPIC  CHOLECYSTECTOMY  ~1995  . LEFT HEART CATHETERIZATION WITH CORONARY ANGIOGRAM N/A 12/22/2011   Procedure: LEFT HEART CATHETERIZATION WITH CORONARY ANGIOGRAM;  Surgeon: Troy Sine, MD;  Location: [facility] CATH LAB;  Service: Cardiovascular;  Laterality: N/A;  . LEFT HEART CATHETERIZATION WITH CORONARY ANGIOGRAM Bilateral 05/16/2012   Procedure: LEFT HEART CATHETERIZATION WITH CORONARY ANGIOGRAM;  Surgeon: Lorretta Harp, MD;  Location: [facility] CATH LAB;  Service: Cardiovascular;  Laterality: Bilateral;  . LEFT HEART CATHETERIZATION WITH CORONARY ANGIOGRAM N/A 11/14/2012   Procedure: LEFT HEART CATHETERIZATION WITH CORONARY ANGIOGRAM;  Surgeon: Troy Sine, MD;  Location: [facility] CATH LAB;  Service: Cardiovascular;  Laterality: N/A;  . LEFT HEART CATHETERIZATION WITH CORONARY ANGIOGRAM N/A 09/01/2013   Procedure: LEFT HEART CATHETERIZATION WITH CORONARY ANGIOGRAM;  Surgeon: Leonie Man, MD;  Location: [facility] CATH LAB;  Service: Cardiovascular;  Laterality: N/A;  . LUMBAR LAMINECTOMY  1972; 1985  . NM MYOCAR PERF WALL MOTION  08/30/2009   No significant ischemia  . PERCUTANEOUS CORONARY INTERVENTION-BALLOON ONLY  12/22/2011   Procedure: PERCUTANEOUS CORONARY INTERVENTION-BALLOON ONLY;  Surgeon: Troy Sine, MD;  Location: [facility] CATH LAB;  Service: Cardiovascular;;  . PERCUTANEOUS CORONARY INTERVENTION-BALLOON ONLY  11/14/2012   Procedure: PERCUTANEOUS CORONARY INTERVENTION-BALLOON ONLY;  Surgeon: Troy Sine, MD;  Location[facility] CATH LAB;  Service: Cardiovascular;;  . PERCUTANEOUS CORONARY STENT INTERVENTION (PCI-S)  05/16/2012   Procedure: PERCUTANEOUS CORONARY STENT INTERVENTION (PCI-S);  Surgeon: Lorretta Harp, MD;  Location: [facility] CATH LAB;  Service: Cardiovascular;;  . PERCUTANEOUS CORONARY STENT INTERVENTION (PCI-S)  09/01/2013   Procedure: PERCUTANEOUS CORONARY STENT INTERVENTION (PCI-S);  Surgeon: Leonie Man, MD;  Location: [facility] CATH LAB;  Service: Cardiovascular;;    Current Medications: Outpatient  Medications Prior to Visit  Medication Sig Dispense Refill  . acetaminophen (TYLENOL) 325 MG tablet Take 2 tablets (650 mg total) by mouth every 4 (four) hours as needed for headache or mild pain.    [name] ALPRAZolam (XANAX) 0.5 MG tablet Take 0.5 mg by mouth 3 (three) times daily as needed. For anxiety    . aspirin EC 81 MG EC tablet Take 1 tablet (81 mg total) by mouth daily.    [name] atorvastatin (LIPITOR) 80 MG tablet Take 1 tablet (80 mg total) by mouth daily at 6 PM. Need appointment before anymore refills 90 tablet 0  . fish oil-omega-3 fatty acids 1000 MG capsule Take 1 g by mouth daily.     . folic acid (FOLVITE) 1 MG tablet Take 1 mg by mouth daily.     [name] glipiZIDE (GLUCOTROL XL) 10 MG 24 hr tablet Take 10 mg by mouth 2 (two) times daily.    . hydroxychloroquine (PLAQUENIL) 200 MG tablet Take 200 mg by mouth 2 (two) times daily.    . metFORMIN (GLUCOPHAGE) 1000 MG tablet Take 1 tablet (1,000 mg total) by mouth 2 (two) times daily with a meal.    . metoprolol tartrate (LOPRESSOR) 25 MG tablet TAKE 1 TABLET (25 MG TOTAL) BY MOUTH 2 (TWO) TIMES DAILY. PLEASE CONTACT OFFICE FOR ADDITIONAL REFILLS 180 tablet 3  . quinapril (  ACCUPRIL) 40 MG tablet Take 0.5 tablets (20 mg total) by mouth at bedtime. 45 tablet 0  . sulfaSALAzine (AZULFIDINE) 500 MG tablet Take 500 mg by mouth 2 (two) times daily.    . traMADol (ULTRAM) 50 MG tablet Take 50 mg by mouth every 6 (six) hours as needed for moderate pain.    [name] BRILINTA 90 MG TABS tablet TAKE (1) TABLET BY MOUTH TWICE DAILY. 60 tablet 0  . cetirizine (ZYRTEC) 10 MG tablet Take 10 mg by mouth daily as needed for allergies.    [name] insulin glargine (LANTUS) 100 UNIT/ML injection Inject 50 Units into the skin at bedtime.     . isosorbide mononitrate (IMDUR) 120 MG 24 hr tablet Take 1 tablet (120 mg total) by mouth daily. 30 tablet 6  . methotrexate (RHEUMATREX) 2.5 MG tablet Take 25 mg by mouth once a week. Caution:Chemotherapy. Protect from light. ON THURSDAYS      . nitroGLYCERIN (NITROSTAT) 0.4 MG SL tablet Place 1 tablet (0.4 mg total) under the tongue every 5 (five) minutes x 3 doses as needed for chest pain. 25 tablet 2   No facility-administered medications prior to visit.      Allergies:   Ivp dye [iodinated diagnostic agents]; Clopidogrel bisulfate; Iodine; and Plavix [clopidogrel bisulfate]   Social History   Social History  . Marital status: Married    Spouse name: N/A  . Number of children: N/A  . Years of education: N/A   Occupational History  . tractor Recruitment consultant    Social History Main Topics  . Smoking status: Current Every Day Smoker    Packs/day: 0.75    Years: 41.00    Types: Cigarettes  . Smokeless tobacco: Never Used  . Alcohol use No  . Drug use: No  . Sexual activity: Yes   Other Topics Concern  . None   Social History Narrative  . None     Family History:  The patient's family history includes Cancer in his mother; Colon cancer in his father; Heart disease in his mother; Hypertension in his mother.   ROS:   Please see the history of present illness.    ROS All other systems reviewed and are negative.   PHYSICAL EXAM:   VS:  BP (!) 144/86   Pulse 79   Ht 6' (1.829 m)   Wt 245 lb (111.1 kg)   BMI 33.23 kg/m    GEN: Well nourished, well developed, in no acute distress  HEENT: normal  Neck: no JVD, carotid bruits, or masses Cardiac: RRR; no murmurs, rubs, or gallops,no edema  Respiratory:  Initial breath sounds throughout, otherwise clear to auscultation bilaterally, normal work of breathing GI: soft, nontender, nondistended, + BS, large periumbilical hernia and bilateral inguinal hernias MS: no deformity or atrophy  Skin: warm and dry, no rash Neuro:  Alert and Oriented x 3, Strength and sensation are intact Psych: euthymic mood, full affect  Wt Readings from Last 3 Encounters:  04/24/16 245 lb (111.1 kg)  11/03/15 236 lb (107 kg)  07/05/15 237 lb (107.5 kg)      Studies/Labs  Reviewed:   EKG:  EKG is ordered today.  The ekg ordered today demonstrates Sinus rhythm with a single PVC, old inferior wall MI, QTC 444 ms  Recent Labs: No results found for requested labs within last 8760 hours.   Lipid Panel    Component Value Date/Time   CHOL 138 09/02/2013 0223   TRIG 322 (H) 09/02/2013 [id number]  HDL 29 (L) 09/02/2013 0223   CHOLHDL 4.8 09/02/2013 0223   VLDL 64 (H) 09/02/2013 0223   LDLCALC 45 09/02/2013 0223     ASSESSMENT:    1. Coronary artery disease involving native coronary artery of native heart without angina pectoris   2. Essential hypertension   3. Chronic obstructive pulmonary disease, unspecified COPD type ([place])   4. Chronic combined systolic and diastolic CHF (congestive heart failure) ([place])   5. Hyperlipidemia   6. Preoperative cardiovascular examination      PLAN:  In order of problems listed above:  1. CAD: This is the longest period that Mr. Sellitti has ever had without developing in-stent restenosis, and hopefully the fact that he has quit smoking will lead to reduce progression of disease. His most recent procedure was more than a year ago and dual antiplatelet therapy is no longer mandatory. 2. HTN: Blood pressure was elevated when he first checked in, normal when rechecked 10 minutes later. No changes to medications. 3. COPD: I suspect this is the most important contributor to his exertional dyspnea, cannot exclude contribution of heart failure 4. CHF: He has mildly depressed left ventricular systolic function. Weight gain noted, but can be attributed to smoking cessation. No other peripheral signs of hypervolemia. I don't think diuretics are indicated. Reinforce sodium restriction. 5. HLP: LDL cholesterol is well within target range. HDL remains very low and is unlikely to improve without increased physical activity and substantial weight loss 6. Preop eval: We have not been able to interrupt his dual antiplatelet therapy in many years.  I think this is the optimal time to finally have him undergo his hernia surgery repair, low to moderate risk of major cardiovascular events.    Medication Adjustments/Labs and Tests Ordered: Current medicines are reviewed at length with the patient today.  Concerns regarding medicines are outlined above.  Medication changes, Labs and Tests ordered today are listed in the Patient Instructions below. Patient Instructions  Dr Sallyanne Kuster has recommended making the following medication changes: 1. STOP Brilinta  Dr Sallyanne Kuster recommends that you schedule a follow-up appointment in 6 months. You will receive a reminder letter in the mail two months in advance. If you don't receive a letter, please call our office to schedule the follow-up appointment.  If you need a refill on your cardiac medications before your next appointment, please call your pharmacy.    Signed, Sanda Klein, MD  04/24/2016 5:36 PM    [facility] [address], [place], [place]  [address] Phone: [phone]; Fax: [phone]

## 2016-04-24 NOTE — Patient Instructions (Signed)
Dr Sallyanne Kuster has recommended making the following medication changes: 1. STOP Brilinta  Dr Sallyanne Kuster recommends that you schedule a follow-up appointment in 6 months. You will receive a reminder letter in the mail two months in advance. If you don't receive a letter, please call our office to schedule the follow-up appointment.  If you need a refill on your cardiac medications before your next appointment, please call your pharmacy.

## 2016-04-25 DIAGNOSIS — I5042 Chronic combined systolic (congestive) and diastolic (congestive) heart failure: Secondary | ICD-10-CM | POA: Insufficient documentation

## 2016-04-26 ENCOUNTER — Encounter: Payer: Self-pay | Admitting: *Deleted

## 2016-04-26 DIAGNOSIS — Z006 Encounter for examination for normal comparison and control in clinical research program: Secondary | ICD-10-CM

## 2016-04-26 NOTE — Progress Notes (Signed)
I saw patient for 75-month Dal-Gene study visit. Patient is doing well overall and has not been re-hospitalized in last year. Patient is 96% compliant with study medication. I will see patient in 6 months for next visit. Patient had not taken blood pressure medication and blood pressure was 139/76. I reminded patient to take medications when he returned home.

## 2016-05-05 NOTE — Telephone Encounter (Signed)
Clearance letter faxed to April Staton, [place] at [facility] on 05/04/16.

## 2016-05-08 ENCOUNTER — Other Ambulatory Visit: Payer: Self-pay | Admitting: Cardiovascular Disease

## 2016-05-08 NOTE — Telephone Encounter (Signed)
Rx request sent to pharmacy.  

## 2016-05-12 DIAGNOSIS — K420 Umbilical hernia with obstruction, without gangrene: Secondary | ICD-10-CM

## 2016-05-12 DIAGNOSIS — K4090 Unilateral inguinal hernia, without obstruction or gangrene, not specified as recurrent: Secondary | ICD-10-CM

## 2016-05-12 HISTORY — DX: Umbilical hernia with obstruction, without gangrene: K42.0

## 2016-05-12 HISTORY — DX: Unilateral inguinal hernia, without obstruction or gangrene, not specified as recurrent: K40.90

## 2016-05-17 DIAGNOSIS — M0689 Other specified rheumatoid arthritis, multiple sites: Secondary | ICD-10-CM | POA: Diagnosis not present

## 2016-05-17 DIAGNOSIS — Z6834 Body mass index (BMI) 34.0-34.9, adult: Secondary | ICD-10-CM | POA: Diagnosis not present

## 2016-05-17 DIAGNOSIS — E1165 Type 2 diabetes mellitus with hyperglycemia: Secondary | ICD-10-CM | POA: Diagnosis not present

## 2016-05-17 DIAGNOSIS — J449 Chronic obstructive pulmonary disease, unspecified: Secondary | ICD-10-CM | POA: Diagnosis not present

## 2016-05-17 DIAGNOSIS — I7389 Other specified peripheral vascular diseases: Secondary | ICD-10-CM | POA: Diagnosis not present

## 2016-05-17 DIAGNOSIS — E6609 Other obesity due to excess calories: Secondary | ICD-10-CM | POA: Diagnosis not present

## 2016-05-17 DIAGNOSIS — I10 Essential (primary) hypertension: Secondary | ICD-10-CM | POA: Diagnosis not present

## 2016-05-18 ENCOUNTER — Encounter (HOSPITAL_BASED_OUTPATIENT_CLINIC_OR_DEPARTMENT_OTHER): Payer: Self-pay | Admitting: *Deleted

## 2016-05-18 NOTE — Pre-Procedure Instructions (Signed)
To come for BMET 

## 2016-05-19 ENCOUNTER — Encounter (HOSPITAL_BASED_OUTPATIENT_CLINIC_OR_DEPARTMENT_OTHER)
Admission: RE | Admit: 2016-05-19 | Discharge: 2016-05-19 | Disposition: A | Discharge status: Home / Self Care | Payer: Commercial Managed Care - HMO | Source: Ambulatory Visit | Attending: Surgery | Admitting: Surgery

## 2016-05-19 DIAGNOSIS — K429 Umbilical hernia without obstruction or gangrene: Secondary | ICD-10-CM | POA: Diagnosis not present

## 2016-05-19 DIAGNOSIS — Z01812 Encounter for preprocedural laboratory examination: Secondary | ICD-10-CM | POA: Insufficient documentation

## 2016-05-19 DIAGNOSIS — K4020 Bilateral inguinal hernia, without obstruction or gangrene, not specified as recurrent: Secondary | ICD-10-CM | POA: Diagnosis not present

## 2016-05-19 LAB — BASIC METABOLIC PANEL
Anion gap: 8 (ref 5–15)
BUN: 10 mg/dL (ref 6–20)
CALCIUM: 9.6 mg/dL (ref 8.9–10.3)
CHLORIDE: 105 mmol/L (ref 101–111)
CO2: 26 mmol/L (ref 22–32)
CREATININE: 0.92 mg/dL (ref 0.61–1.24)
Glucose, Bld: 295 mg/dL — ABNORMAL HIGH (ref 65–99)
Potassium: 4.6 mmol/L (ref 3.5–5.1)
SODIUM: 139 mmol/L (ref 135–145)

## 2016-05-23 NOTE — H&P (Signed)
Robert [name]. [facility]  Location: [facility] Patient #: [id number] DOB: [birth year] Married / Language: English / Race: White Male   History of Present Illness   The patient is a 65 year old male who presents for an evaluation of a hernia. This is a pleasant man referred to me by Dr. Redmond School for evaluation of an umbilical and inguinal hernia. The patient reports these have been for many years and they're getting larger and he is now having some discomfort at the umbilicus. He is otherwise without complaints. He has had some nausea and vomiting that has had a prior cholecystectomy and is being referred to a gastroenterologist. He currently denies any abdominal pain today.   Other Problems Elbert Ewings, CMA Arthritis Back Pain Chronic Obstructive Lung Disease Diabetes Mellitus High blood pressure Hypercholesterolemia Inguinal Hernia Myocardial infarction Umbilical Hernia Repair Vascular Disease Ventral Hernia Repair  Past Surgical History Elbert Ewings, CMA;  Gallbladder Surgery - Laparoscopic Spinal Surgery - Lower Back  Allergies Elbert Ewings, CMA; Contrast Media Ready-Box *MEDICAL DEVICES AND SUPPLIES* Dye FDC Red 40 (Carmine Red) *PHARMACEUTICAL ADJUVANTS*  Medication History Elbert Ewings, CMA; MetFORMIN HCl (1000MG  Tablet, Oral two times daily) Active. GlipiZIDE XL (10MG  Tablet ER 24HR, Oral two times daily) Active. Janumet XR (100-1000MG  Tablet ER 24HR, Oral) Active. Metoprolol Tartrate (25MG  Tablet, Oral two times daily) Active. Quinapril HCl (40MG  Tablet, Oral) Active. Brilinta (90MG  Tablet, Oral two times daily) Active. Isosorbide Mononitrate ER (120MG  Tablet ER 24HR, Oral) Active. SulfaSALAzine (500MG  Tablet, Oral two times daily) Active. Fish Oil Pearls (150MG  Capsule, Oral) Active. Tyler Aas FlexTouch (100UNIT/ML Soln Pen-inj, Subcutaneous 30 units daily) Active. Medications Reconciled  Social History Elbert Ewings,  [place]; Caffeine use Carbonated beverages, Coffee. No alcohol use No drug use Tobacco use Former smoker.  Family History Elbert Ewings, [place]; Alcohol Abuse Brother, Father. Arthritis Brother, Mother. Colon Cancer Brother, Mother. Diabetes Mellitus Brother, Father. Heart Disease Brother, Father. Heart disease in male family member before age 68 Heart disease in male family member before age 45 Hypertension Brother, Father, Mother. Ovarian Cancer Mother. Respiratory Condition Mother.    Review of Systems Elbert Ewings CMA General Not Present- Appetite Loss, Chills, Fatigue, Fever, Night Sweats, Weight Gain and Weight Loss. Skin Not Present- Change in Wart/Mole, Dryness, Hives, Jaundice, New Lesions, Non-Healing Wounds, Rash and Ulcer. HEENT Not Present- Earache, Hearing Loss, Hoarseness, Nose Bleed, Oral Ulcers, Ringing in the Ears, Seasonal Allergies, Sinus Pain, Sore Throat, Visual Disturbances, Wears glasses/contact lenses and Yellow Eyes. Breast Not Present- Breast Mass, Breast Pain, Nipple Discharge and Skin Changes. Cardiovascular Present- Leg Cramps and Shortness of Breath. Not Present- Chest Pain, Difficulty Breathing Lying Down, Palpitations, Rapid Heart Rate and Swelling of Extremities. Gastrointestinal Present- Abdominal Pain, Chronic diarrhea and Vomiting. Not Present- Bloating, Bloody Stool, Change in Bowel Habits, Constipation, Difficulty Swallowing, Excessive gas, Gets full quickly at meals, Hemorrhoids, Indigestion, Nausea and Rectal Pain. Male Genitourinary Present- Impotence. Not Present- Blood in Urine, Change in Urinary Stream, Frequency, Nocturia, Painful Urination, Urgency and Urine Leakage. Musculoskeletal Present- Joint Pain, Joint Stiffness and Swelling of Extremities. Not Present- Back Pain, Muscle Pain and Muscle Weakness. Neurological Not Present- Decreased Memory, Fainting, Headaches, Numbness, Seizures, Tingling, Tremor, Trouble walking and  Weakness. Psychiatric Not Present- Anxiety, Bipolar, Change in Sleep Pattern, Depression, Fearful and Frequent crying. Endocrine Not Present- Cold Intolerance, Excessive Hunger, Hair Changes, Heat Intolerance, Hot flashes and New Diabetes. Hematology Present- Blood Thinners. Not Present- Easy Bruising, Excessive bleeding, Gland problems, HIV and Persistent Infections.  Vitals  Weight: 246 lb Height: 72in Body Surface Area: 2.33 m Body Mass Index: 33.36 kg/m  Temp.: 98.30F  Pulse: 85 (Regular)  BP: 130/80 (Sitting, Left Arm, Standard)     Physical Exam (Tiarra Anastacio A. Ninfa Linden MD;  Mental Status-Alert. General Appearance-Consistent with stated age. Hydration-Well hydrated. Voice-Normal.  Head and Neck Head-normocephalic, atraumatic with no lesions or palpable masses. Trachea-midline.  Eye Eyeball - Bilateral-Extraocular movements intact. Sclera/Conjunctiva - Bilateral-No scleral icterus.  Chest and Lung Exam Chest and lung exam reveals -quiet, even and easy respiratory effort with no use of accessory muscles and on auscultation, normal breath sounds, no adventitious sounds and normal vocal resonance. Inspection Chest Wall - Normal. Back - normal.  Cardiovascular Cardiovascular examination reveals -normal heart sounds, regular rate and rhythm with no murmurs and normal pedal pulses bilaterally.  Abdomen Inspection Skin - Scar - no surgical scars. Hernias - Umbilical hernia - Incarcerated. Note: There is a moderate-sized, chronically incarcerated umbilical hernia which is nontender and contains omentum. Inguinal hernia - Left - Reducible. Right - Reducible. Palpation/Percussion Palpation and Percussion of the abdomen reveal - Soft, Non Tender, No Rebound tenderness, No Rigidity (guarding) and No hepatosplenomegaly. Auscultation Auscultation of the abdomen reveals - Bowel sounds normal.  Neurologic Neurologic evaluation reveals -alert and  oriented x 3 with no impairment of recent or remote memory. Mental Status-Normal.  Musculoskeletal Normal Exam - Left-Upper Extremity Strength Normal and Lower Extremity Strength Normal. Normal Exam - Right-Upper Extremity Strength Normal, Lower Extremity Weakness.    Assessment & Plan    UMBILICAL HERNIA, INCARCERATED (K42.0) BILATERAL INGUINAL HERNIA (K40.20)  Impression: Because his hernias are getting larger and he is on anticoagulation, I would recommend repair of all 3 hernias at one time. I would do this with a bilateral laparoscopic inguinal hernia repair with mesh and then closed the umbilical hernia with open surgery with mesh at the completion of the inguinal hernia repair. I discussed this with him in detail. I discussed the surgical procedure. I discussed the risk of surgery which includes but is not limited to bleeding, infection, chronic pain, nerve entrapment, injury to surrounding structures, recurrent hernia, etc. He will need cardiac clearance and a decision made on stopping his anticoagulation and whether or not he will need to be bridged with Lovenox. He reports that he'll be seeing his cardiologist next week

## 2016-05-24 ENCOUNTER — Ambulatory Visit (HOSPITAL_BASED_OUTPATIENT_CLINIC_OR_DEPARTMENT_OTHER): Payer: Commercial Managed Care - HMO | Admitting: Anesthesiology

## 2016-05-24 ENCOUNTER — Encounter (HOSPITAL_BASED_OUTPATIENT_CLINIC_OR_DEPARTMENT_OTHER): Payer: Self-pay | Admitting: *Deleted

## 2016-05-24 ENCOUNTER — Ambulatory Visit (HOSPITAL_BASED_OUTPATIENT_CLINIC_OR_DEPARTMENT_OTHER)
Admission: RE | Admit: 2016-05-24 | Discharge: 2016-05-24 | Disposition: A | Discharge status: Home / Self Care | Payer: Commercial Managed Care - HMO | Source: Ambulatory Visit | Attending: Surgery | Admitting: Surgery

## 2016-05-24 ENCOUNTER — Encounter (HOSPITAL_BASED_OUTPATIENT_CLINIC_OR_DEPARTMENT_OTHER)
Admission: RE | Disposition: A | Discharge status: Home / Self Care | Payer: Self-pay | Source: Ambulatory Visit | Attending: Surgery

## 2016-05-24 DIAGNOSIS — Z79899 Other long term (current) drug therapy: Secondary | ICD-10-CM | POA: Diagnosis not present

## 2016-05-24 DIAGNOSIS — Z8249 Family history of ischemic heart disease and other diseases of the circulatory system: Secondary | ICD-10-CM | POA: Insufficient documentation

## 2016-05-24 DIAGNOSIS — K4020 Bilateral inguinal hernia, without obstruction or gangrene, not specified as recurrent: Secondary | ICD-10-CM | POA: Diagnosis not present

## 2016-05-24 DIAGNOSIS — Z87891 Personal history of nicotine dependence: Secondary | ICD-10-CM | POA: Diagnosis not present

## 2016-05-24 DIAGNOSIS — K420 Umbilical hernia with obstruction, without gangrene: Secondary | ICD-10-CM | POA: Insufficient documentation

## 2016-05-24 DIAGNOSIS — I110 Hypertensive heart disease with heart failure: Secondary | ICD-10-CM | POA: Diagnosis not present

## 2016-05-24 DIAGNOSIS — I10 Essential (primary) hypertension: Secondary | ICD-10-CM | POA: Diagnosis not present

## 2016-05-24 DIAGNOSIS — J449 Chronic obstructive pulmonary disease, unspecified: Secondary | ICD-10-CM | POA: Insufficient documentation

## 2016-05-24 DIAGNOSIS — I509 Heart failure, unspecified: Secondary | ICD-10-CM | POA: Diagnosis not present

## 2016-05-24 DIAGNOSIS — I2510 Atherosclerotic heart disease of native coronary artery without angina pectoris: Secondary | ICD-10-CM | POA: Diagnosis not present

## 2016-05-24 DIAGNOSIS — M1990 Unspecified osteoarthritis, unspecified site: Secondary | ICD-10-CM | POA: Diagnosis not present

## 2016-05-24 DIAGNOSIS — I252 Old myocardial infarction: Secondary | ICD-10-CM | POA: Diagnosis not present

## 2016-05-24 DIAGNOSIS — E119 Type 2 diabetes mellitus without complications: Secondary | ICD-10-CM | POA: Diagnosis not present

## 2016-05-24 DIAGNOSIS — E7800 Pure hypercholesterolemia, unspecified: Secondary | ICD-10-CM | POA: Insufficient documentation

## 2016-05-24 DIAGNOSIS — Z7984 Long term (current) use of oral hypoglycemic drugs: Secondary | ICD-10-CM | POA: Insufficient documentation

## 2016-05-24 DIAGNOSIS — K429 Umbilical hernia without obstruction or gangrene: Secondary | ICD-10-CM | POA: Diagnosis not present

## 2016-05-24 HISTORY — DX: Umbilical hernia with obstruction, without gangrene: K42.0

## 2016-05-24 HISTORY — DX: Unilateral inguinal hernia, without obstruction or gangrene, not specified as recurrent: K40.90

## 2016-05-24 HISTORY — DX: Presence of dental prosthetic device (complete) (partial): Z97.2

## 2016-05-24 HISTORY — PX: LAPAROSCOPIC INGUINAL HERNIA WITH UMBILICAL HERNIA: SHX5658

## 2016-05-24 HISTORY — DX: Complete loss of teeth, unspecified cause, unspecified class: K08.109

## 2016-05-24 HISTORY — DX: Old myocardial infarction: I25.2

## 2016-05-24 HISTORY — DX: Gastro-esophageal reflux disease without esophagitis: K21.9

## 2016-05-24 HISTORY — DX: Reserved for inherently not codable concepts without codable children: IMO0001

## 2016-05-24 HISTORY — DX: Chronic obstructive pulmonary disease, unspecified: J44.9

## 2016-05-24 HISTORY — DX: Type 2 diabetes mellitus without complications: E11.9

## 2016-05-24 HISTORY — PX: INSERTION OF MESH: SHX5868

## 2016-05-24 HISTORY — DX: Long term (current) use of insulin: Z79.4

## 2016-05-24 LAB — GLUCOSE, CAPILLARY
GLUCOSE-CAPILLARY: 167 mg/dL — AB (ref 65–99)
Glucose-Capillary: 144 mg/dL — ABNORMAL HIGH (ref 65–99)

## 2016-05-24 SURGERY — LAPAROSCOPIC INGUINAL HERNIA WITH UMBILICAL HERNIA
Anesthesia: General | Site: Abdomen | Laterality: Bilateral

## 2016-05-24 MED ORDER — CEFAZOLIN SODIUM-DEXTROSE 2-4 GM/100ML-% IV SOLN
2.0000 g | INTRAVENOUS | Status: AC
  Administered 2016-05-24: 2 g via INTRAVENOUS

## 2016-05-24 MED ORDER — CHLORHEXIDINE GLUCONATE CLOTH 2 % EX PADS: 6.0000 | MEDICATED_PAD | Freq: Once | CUTANEOUS | Status: DC

## 2016-05-24 MED ORDER — ONDANSETRON HCL 4 MG/2ML IJ SOLN
INTRAMUSCULAR | Status: AC
  Filled 2016-05-24: qty 2

## 2016-05-24 MED ORDER — LIDOCAINE 2% (20 MG/ML) 5 ML SYRINGE
INTRAMUSCULAR | Status: AC
  Filled 2016-05-24: qty 5

## 2016-05-24 MED ORDER — MIDAZOLAM HCL 2 MG/2ML IJ SOLN
1.0000 mg | INTRAMUSCULAR | Status: DC | PRN
  Administered 2016-05-24: 1 mg via INTRAVENOUS

## 2016-05-24 MED ORDER — BUPIVACAINE-EPINEPHRINE (PF) 0.5% -1:200000 IJ SOLN
INTRAMUSCULAR | Status: DC | PRN
  Administered 2016-05-24: 20 mL

## 2016-05-24 MED ORDER — DEXAMETHASONE SODIUM PHOSPHATE 4 MG/ML IJ SOLN
INTRAMUSCULAR | Status: DC | PRN
Start: 2016-05-24 — End: 2016-05-24
  Administered 2016-05-24: 4 mg via INTRAVENOUS

## 2016-05-24 MED ORDER — MIDAZOLAM HCL 2 MG/2ML IJ SOLN
INTRAMUSCULAR | Status: AC
  Filled 2016-05-24: qty 2

## 2016-05-24 MED ORDER — SUGAMMADEX SODIUM 200 MG/2ML IV SOLN
INTRAVENOUS | Status: AC
  Filled 2016-05-24: qty 2

## 2016-05-24 MED ORDER — SCOPOLAMINE 1 MG/3DAYS TD PT72: 1.0000 | MEDICATED_PATCH | Freq: Once | TRANSDERMAL | Status: DC | PRN

## 2016-05-24 MED ORDER — PROPOFOL 10 MG/ML IV BOLUS
INTRAVENOUS | Status: DC | PRN
  Administered 2016-05-24: 150 mg via INTRAVENOUS

## 2016-05-24 MED ORDER — FENTANYL CITRATE (PF) 100 MCG/2ML IJ SOLN
25.0000 ug | INTRAMUSCULAR | Status: DC | PRN
  Administered 2016-05-24: 50 ug via INTRAVENOUS

## 2016-05-24 MED ORDER — FENTANYL CITRATE (PF) 100 MCG/2ML IJ SOLN
INTRAMUSCULAR | Status: AC
  Filled 2016-05-24: qty 2

## 2016-05-24 MED ORDER — LACTATED RINGERS IV SOLN
INTRAVENOUS | Status: DC
  Administered 2016-05-24 (×2): via INTRAVENOUS

## 2016-05-24 MED ORDER — LIDOCAINE HCL (CARDIAC) 20 MG/ML IV SOLN
INTRAVENOUS | Status: DC | PRN
  Administered 2016-05-24: 30 mg via INTRAVENOUS

## 2016-05-24 MED ORDER — ROCURONIUM BROMIDE 10 MG/ML (PF) SYRINGE
PREFILLED_SYRINGE | INTRAVENOUS | Status: AC
  Filled 2016-05-24: qty 10

## 2016-05-24 MED ORDER — OXYCODONE HCL 5 MG/5ML PO SOLN: 5.0000 mg | Freq: Once | ORAL | Status: DC | PRN

## 2016-05-24 MED ORDER — DEXAMETHASONE SODIUM PHOSPHATE 10 MG/ML IJ SOLN
INTRAMUSCULAR | Status: AC
  Filled 2016-05-24: qty 1

## 2016-05-24 MED ORDER — OXYCODONE HCL 5 MG PO TABS
5.0000 mg | ORAL_TABLET | Freq: Once | ORAL | Status: DC | PRN
Start: 2016-05-24 — End: 2016-05-24

## 2016-05-24 MED ORDER — EPHEDRINE 5 MG/ML INJ
INTRAVENOUS | Status: AC
  Filled 2016-05-24: qty 10

## 2016-05-24 MED ORDER — PROPOFOL 10 MG/ML IV BOLUS
INTRAVENOUS | Status: AC
  Filled 2016-05-24: qty 20

## 2016-05-24 MED ORDER — GLYCOPYRROLATE 0.2 MG/ML IJ SOLN: 0.2000 mg | Freq: Once | INTRAMUSCULAR | Status: DC | PRN

## 2016-05-24 MED ORDER — PROMETHAZINE HCL 25 MG/ML IJ SOLN: 6.2500 mg | INTRAMUSCULAR | Status: DC | PRN

## 2016-05-24 MED ORDER — CEFAZOLIN SODIUM-DEXTROSE 2-4 GM/100ML-% IV SOLN
INTRAVENOUS | Status: AC
  Filled 2016-05-24: qty 100

## 2016-05-24 MED ORDER — KETOROLAC TROMETHAMINE 30 MG/ML IJ SOLN
INTRAMUSCULAR | Status: AC
  Filled 2016-05-24: qty 1

## 2016-05-24 MED ORDER — OXYCODONE-ACETAMINOPHEN 5-325 MG PO TABS: 1.0000 | ORAL_TABLET | ORAL | 0 refills | Status: DC | PRN

## 2016-05-24 MED ORDER — FENTANYL CITRATE (PF) 100 MCG/2ML IJ SOLN
50.0000 ug | INTRAMUSCULAR | Status: DC | PRN
  Administered 2016-05-24 (×2): 50 ug via INTRAVENOUS

## 2016-05-24 MED ORDER — SUGAMMADEX SODIUM 500 MG/5ML IV SOLN
INTRAVENOUS | Status: DC | PRN
  Administered 2016-05-24: 500 mg via INTRAVENOUS

## 2016-05-24 MED ORDER — ONDANSETRON HCL 4 MG/2ML IJ SOLN
INTRAMUSCULAR | Status: DC | PRN
  Administered 2016-05-24: 4 mg via INTRAVENOUS

## 2016-05-24 MED ORDER — KETOROLAC TROMETHAMINE 30 MG/ML IJ SOLN: 30.0000 mg | Freq: Once | INTRAMUSCULAR | Status: DC | PRN

## 2016-05-24 MED ORDER — ROCURONIUM BROMIDE 100 MG/10ML IV SOLN
INTRAVENOUS | Status: DC | PRN
  Administered 2016-05-24: 50 mg via INTRAVENOUS

## 2016-05-24 SURGICAL SUPPLY — 39 items
APPLIER CLIP LOGIC TI 5 (MISCELLANEOUS) IMPLANT
APR CLP MED LRG 33X5 (MISCELLANEOUS)
BLADE CLIPPER SURG (BLADE) IMPLANT
CHLORAPREP W/TINT 26ML (MISCELLANEOUS) ×3 IMPLANT
DEVICE SECURE STRAP 25 ABSORB (INSTRUMENTS) ×3 IMPLANT
DISSECT BALLN SPACEMKR + OVL (BALLOONS) ×3
DISSECTOR BALLN SPACEMKR + OVL (BALLOONS) ×1 IMPLANT
DISSECTOR BLUNT TIP ENDO 5MM (MISCELLANEOUS) IMPLANT
ELECT REM PT RETURN 9FT ADLT (ELECTROSURGICAL) ×3
ELECTRODE REM PT RTRN 9FT ADLT (ELECTROSURGICAL) ×1 IMPLANT
GLOVE BIO SURGEON STRL SZ7 (GLOVE) ×4 IMPLANT
GLOVE BIOGEL PI IND STRL 7.5 (GLOVE) IMPLANT
GLOVE BIOGEL PI INDICATOR 7.5 (GLOVE) ×6
GLOVE SURG SIGNA 7.5 PF LTX (GLOVE) ×3 IMPLANT
GOWN STRL REUS W/ TWL LRG LVL3 (GOWN DISPOSABLE) ×1 IMPLANT
GOWN STRL REUS W/ TWL XL LVL3 (GOWN DISPOSABLE) ×1 IMPLANT
GOWN STRL REUS W/TWL LRG LVL3 (GOWN DISPOSABLE) ×6
GOWN STRL REUS W/TWL XL LVL3 (GOWN DISPOSABLE) ×3
LIQUID BAND (GAUZE/BANDAGES/DRESSINGS) ×6 IMPLANT
MESH 3DMAX 4X6 LT LRG (Mesh General) ×2 IMPLANT
MESH 3DMAX 4X6 RT LRG (Mesh General) ×2 IMPLANT
MESH VENTRALEX ST 2.5 CRC MED (Mesh General) ×2 IMPLANT
NDL INSUFFLATION 14GA 120MM (NEEDLE) IMPLANT
NEEDLE INSUFFLATION 14GA 120MM (NEEDLE) IMPLANT
PACK BASIN DAY SURGERY FS (CUSTOM PROCEDURE TRAY) ×3 IMPLANT
PENCIL BUTTON HOLSTER BLD 10FT (ELECTRODE) ×2 IMPLANT
SCISSORS LAP 5X35 DISP (ENDOMECHANICALS) IMPLANT
SET IRRIG TUBING LAPAROSCOPIC (IRRIGATION / IRRIGATOR) IMPLANT
SET TROCAR LAP APPLE-HUNT 5MM (ENDOMECHANICALS) ×3 IMPLANT
SLEEVE SCD COMPRESS KNEE MED (MISCELLANEOUS) ×3 IMPLANT
SUT MNCRL AB 4-0 PS2 18 (SUTURE) ×3 IMPLANT
SUT NOVA NAB DX-16 0-1 5-0 T12 (SUTURE) ×2 IMPLANT
SUT VIC AB 3-0 SH 27 (SUTURE) ×3
SUT VIC AB 3-0 SH 27X BRD (SUTURE) IMPLANT
TOWEL OR 17X24 6PK STRL BLUE (TOWEL DISPOSABLE) ×3 IMPLANT
TRAY FOLEY BAG SILVER LF 14FR (SET/KITS/TRAYS/PACK) IMPLANT
TRAY FOLEY BAG SILVER LF 16FR (SET/KITS/TRAYS/PACK) IMPLANT
TRAY LAPAROSCOPIC (CUSTOM PROCEDURE TRAY) ×3 IMPLANT
TUBING INSUFFLATION (TUBING) ×3 IMPLANT

## 2016-05-24 NOTE — Interval H&P Note (Signed)
History and Physical Interval Note: no change in H and P  05/24/2016 10:30 AM  Robert [name]  has presented today for surgery, with the diagnosis of Bilateral inguinal and Incarcerated umbilical hernias  The various methods of treatment have been discussed with the patient and family. After consideration of risks, benefits and other options for treatment, the patient has consented to  Procedure(s): BILATERAL LAPAROSCOPIC INGUINAL HERNIA REPAIR WITH MESH AND UMBILICAL HERNIA REPAIR WITH MESH (Bilateral) INSERTION OF MESH (Bilateral) as a surgical intervention .  The patient's history has been reviewed, patient examined, no change in status, stable for surgery.  I have reviewed the patient's chart and labs.  Questions were answered to the patient's satisfaction.     Nattaly Yebra A

## 2016-05-24 NOTE — Discharge Instructions (Signed)
[facility] _____[facility]  UMBILICAL OR INGUINAL HERNIA REPAIR: POST OP INSTRUCTIONS  Always review your discharge instruction sheet given to you by the facility where your surgery was performed. IF YOU HAVE DISABILITY OR FAMILY LEAVE FORMS, YOU MUST BRING THEM TO THE OFFICE FOR PROCESSING.   DO NOT GIVE THEM TO YOUR DOCTOR.  1. A  prescription for pain medication may be given to you upon discharge.  Take your pain medication as prescribed, if needed.  If narcotic pain medicine is not needed, then you may take acetaminophen (Tylenol) or ibuprofen (Advil) as needed. 2. Take your usually prescribed medications unless otherwise directed. 3. If you need a refill on your pain medication, please contact your pharmacy.  They will contact our office to request authorization. Prescriptions will not be filled after 5 pm or on week-ends. 4. You should follow a light diet the first 24 hours after arrival home, such as soup and crackers, etc.  Be sure to include lots of fluids daily.  Resume your normal diet the day after surgery. 5. Most patients will experience some swelling and bruising around the umbilicus or in the groin and scrotum.  Ice packs and reclining will help.  Swelling and bruising can take several days to resolve.  6. It is common to experience some constipation if taking pain medication after surgery.  Increasing fluid intake and taking a stool softener (such as Colace) will usually help or prevent this problem from occurring.  A mild laxative (Milk of Magnesia or Miralax) should be taken according to package directions if there are no bowel movements after 48 hours. 7. Unless discharge instructions indicate otherwise, you may remove your bandages 24-48 hours after surgery, and you may shower at that time.  You may have steri-strips (small skin tapes) in place directly over the incision.  These strips should be left on the skin for 7-10 days.  If your surgeon used skin glue on the  incision, you may shower in 24 hours.  The glue will flake off over the next 2-3 weeks.  Any sutures or staples will be removed at the office during your follow-up visit. 8. ACTIVITIES:  You may resume regular (light) daily activities beginning the next day--such as daily self-care, walking, climbing stairs--gradually increasing activities as tolerated.  You may have sexual intercourse when it is comfortable.  Refrain from any heavy lifting or straining until approved by your doctor. a. You may drive when you are no longer taking prescription pain medication, you can comfortably wear a seatbelt, and you can safely maneuver your car and apply brakes. b. RETURN TO WORK:  __________________________________________________________ 9. You should see your doctor in the office for a follow-up appointment approximately 2-3 weeks after your surgery.  Make sure that you call for this appointment within a day or two after you arrive home to insure a convenient appointment time. 10. OTHER INSTRUCTIONS: NO LIFTING MORE THAN 15 POUNDS FOR 4 WEEKS 11. OK TO SHOWER TOMORROW __________________________________________________________________________________________________________________________________________________________________________________________  WHEN TO CALL YOUR DOCTOR: 1. Fever over 101.0 2. Inability to urinate 3. Nausea and/or vomiting 4. Extreme swelling or bruising 5. Continued bleeding from incision. 6. Increased pain, redness, or drainage from the incision  The clinic staff is available to answer your questions during regular business hours.  Please dont hesitate to call and ask to speak to one of the nurses for clinical concerns.  If you have a medical emergency, go to the nearest emergency room or call 911.  A surgeon  from [facility] is always on call at the hospital   [address] [place], [place], [place]  [address] ?  [address], [place], [place]   [address] [phone] ? [phone] ? FAX [phone] Web site: [email or web address]   Post Anesthesia Home Care Instructions  Activity: Get plenty of rest for the remainder of the day. A responsible adult should stay with you for 24 hours following the procedure.  For the next 24 hours, DO NOT: -Drive a car -Paediatric nurse -Drink alcoholic beverages -Take any medication unless instructed by your physician -Make any legal decisions or sign important papers.  Meals: Start with liquid foods such as gelatin or soup. Progress to regular foods as tolerated. Avoid greasy, spicy, heavy foods. If nausea and/or vomiting occur, drink only clear liquids until the nausea and/or vomiting subsides. Call your physician if vomiting continues.  Special Instructions/Symptoms: Your throat may feel dry or sore from the anesthesia or the breathing tube placed in your throat during surgery. If this causes discomfort, gargle with warm salt water. The discomfort should disappear within 24 hours.  If you had a scopolamine patch placed behind your ear for the management of post- operative nausea and/or vomiting:  1. The medication in the patch is effective for 72 hours, after which it should be removed.  Wrap patch in a tissue and discard in the trash. Wash hands thoroughly with soap and water. 2. You may remove the patch earlier than 72 hours if you experience unpleasant side effects which may include dry mouth, dizziness or visual disturbances. 3. Avoid touching the patch. Wash your hands with soap and water after contact with the patch.

## 2016-05-24 NOTE — Anesthesia Preprocedure Evaluation (Signed)
Anesthesia Evaluation  Patient identified by MRN, date of birth, ID band Patient awake    Reviewed: Allergy & Precautions, NPO status , Patient's Chart, lab work & pertinent test results  Airway Mallampati: II  TM Distance: >3 FB Neck ROM: Full    Dental no notable dental hx.    Pulmonary neg pulmonary ROS, former smoker,    Pulmonary exam normal breath sounds clear to auscultation       Cardiovascular hypertension, + CAD, + Past MI, + Cardiac Stents and +CHF  Normal cardiovascular exam Rhythm:Regular Rate:Normal     Neuro/Psych negative neurological ROS  negative psych ROS   GI/Hepatic negative GI ROS, Neg liver ROS,   Endo/Other  diabetes  Renal/GU negative Renal ROS  negative genitourinary   Musculoskeletal negative musculoskeletal ROS (+)   Abdominal   Peds negative pediatric ROS (+)  Hematology negative hematology ROS (+)   Anesthesia Other Findings   Reproductive/Obstetrics negative OB ROS                             Anesthesia Physical Anesthesia Plan  ASA: III  Anesthesia Plan: General   Post-op Pain Management:    Induction: Intravenous  Airway Management Planned: Oral ETT  Additional Equipment:   Intra-op Plan:   Post-operative Plan: Extubation in OR  Informed Consent: I have reviewed the patients History and Physical, chart, labs and discussed the procedure including the risks, benefits and alternatives for the proposed anesthesia with the patient or authorized representative who has indicated his/her understanding and acceptance.   Dental advisory given  Plan Discussed with: CRNA and Surgeon  Anesthesia Plan Comments:         Anesthesia Quick Evaluation

## 2016-05-24 NOTE — Anesthesia Procedure Notes (Signed)
Procedure Name: Intubation Date/Time: 05/24/2016 11:05 AM Performed by: Hodge Stachnik D Pre-anesthesia Checklist: Patient identified, Emergency Drugs available, Suction available and Patient being monitored Patient Re-evaluated:Patient Re-evaluated prior to inductionOxygen Delivery Method: Circle system utilized Preoxygenation: Pre-oxygenation with 100% oxygen Intubation Type: IV induction Ventilation: Mask ventilation without difficulty Laryngoscope Size: Mac and 3 Grade View: Grade II Tube type: Oral Tube size: 7.0 mm Number of attempts: 1 Airway Equipment and Method: Stylet and Oral airway Placement Confirmation: ETT inserted through vocal cords under direct vision,  positive ETCO2 and breath sounds checked- equal and bilateral Secured at: 21 cm Tube secured with: Tape Dental Injury: Teeth and Oropharynx as per pre-operative assessment

## 2016-05-24 NOTE — Transfer of Care (Signed)
Immediate Anesthesia Transfer of Care Note  Patient: Robert [name]  Procedure(s) Performed: Procedure(s): BILATERAL LAPAROSCOPIC INGUINAL HERNIA REPAIR WITH MESH AND UMBILICAL HERNIA REPAIR WITH MESH (Bilateral) INSERTION OF MESH (Bilateral)  Patient Location: PACU  Anesthesia Type:General  Level of Consciousness: sedated  Airway & Oxygen Therapy: Patient Spontanous Breathing and Patient connected to face mask oxygen  Post-op Assessment: Report given to RN and Post -op Vital signs reviewed and stable  Post vital signs: Reviewed and stable  Last Vitals:  Vitals:   05/24/16 1019 05/24/16 1223  BP: (!) 146/75 (!) (P) 108/56  Pulse: 72 80  Resp: 20 (P) 18  Temp: 36.8 C (P) 36.9 C    Last Pain:  Vitals:   05/24/16 1019  TempSrc: Oral         Complications: No apparent anesthesia complications

## 2016-05-24 NOTE — Anesthesia Postprocedure Evaluation (Signed)
Anesthesia Post Note  Patient: Robert [name]  Procedure(s) Performed: Procedure(s) (LRB): BILATERAL LAPAROSCOPIC INGUINAL HERNIA REPAIR WITH MESH AND UMBILICAL HERNIA REPAIR WITH MESH (Bilateral) INSERTION OF MESH (Bilateral)  Patient location during evaluation: PACU Anesthesia Type: General Level of consciousness: awake and alert Pain management: pain level controlled Vital Signs Assessment: post-procedure vital signs reviewed and stable Respiratory status: spontaneous breathing, nonlabored ventilation, respiratory function stable and patient connected to nasal cannula oxygen Cardiovascular status: blood pressure returned to baseline and stable Postop Assessment: no signs of nausea or vomiting Anesthetic complications: no    Last Vitals:  Vitals:   05/24/16 1230 05/24/16 1245  BP: 124/63 (!) 144/88  Pulse: 81 83  Resp: (!) 25 (!) 24  Temp:      Last Pain:  Vitals:   05/24/16 1223  TempSrc:   PainSc: Asleep                 Chloeann Alfred S

## 2016-05-24 NOTE — Op Note (Signed)
BILATERAL LAPAROSCOPIC INGUINAL HERNIA REPAIR WITH MESH AND UMBILICAL HERNIA REPAIR WITH MESH, INSERTION OF MESH  Procedure Note  Robert [name] 05/24/2016   Pre-op Diagnosis: Bilateral inguinal hernia and Incarcerated umbilical hernia     Post-op Diagnosis: same  Procedure(s): BILATERAL LAPAROSCOPIC INGUINAL HERNIA REPAIR WITH MESH AND UMBILICAL HERNIA REPAIR WITH MESH INSERTION OF MESH  Surgeon(s): Coralie Keens, MD  Anesthesia: General  Staff:  Circulator: Izora Ribas, RN Scrub Person: Maurene Capes, RN  Estimated Blood Loss: Minimal                         Robert [name]   Date: 05/24/2016  Time: 12:12 PM

## 2016-05-25 NOTE — Op Note (Signed)
Robert [name], CARON NO.:  [id number]  MEDICAL RECORD NO.:  [id number]  LOCATION:                                 FACILITY:  PHYSICIAN:  Coralie Keens, M.D. DATE OF BIRTH:  June 04, 1951  DATE OF PROCEDURE:  05/24/2016 DATE OF DISCHARGE:                              OPERATIVE REPORT   PREOPERATIVE DIAGNOSES: 1. Bilateral inguinal hernias. 2. Incarcerated umbilical hernia.  POSTOPERATIVE DIAGNOSES: 1. Bilateral inguinal hernias. 2. Incarcerated umbilical hernia.  PROCEDURES: 1. Laparoscopic bilateral inguinal hernia repair with mesh. 2. Repair of an umbilical hernia with mesh.  SURGEON:  Coralie Keens, M.D.  ANESTHESIA:  General and 0.5% [place].  ESTIMATED BLOOD LOSS:  Minimal.  FINDINGS:  The patient was found to have direct bilateral inguinal hernias, which were repaired with two separate pieces of Bard 3DMax large Prolene mesh.  He had an incarcerated umbilical hernia containing omentum.  The fascial defect was repaired with a 6.4-cm round Ventralight ST Prolene patch from Bard.  PROCEDURE IN DETAIL:  The patient was brought to the operating room, identified as Robert [name].  He was placed supine on the operating table, general anesthesia was induced.  His abdomen was then prepped and draped in usual sterile fashion.  I made a small transverse incision below the umbilicus.  I carried this down to the fascia, same below the large incarcerated hernia.  I then opened the fascia up just to the right of the midline.  The rectus muscle was then manipulated and elevated.  I passed the dissecting balloon underneath the rectus sheath and manipulated toward the pubis.  The dissecting balloon was then insufflated under direct vision, dissecting out the preperitoneal space. I then removed the dissecting balloon and insufflated the preperitoneal space with carbon dioxide.  I then placed two 5-mm trocars in the patient's lower midline both under direct  vision.  I dissected out the right inguinal area first.  The patient had a moderate-sized direct hernia defect.  I isolated the cord structures and found no evidence of indirect hernia.  I then dissected out the left inguinal area and he had a much larger direct hernia on the left side.  Again, all contents and sac had been completely reduced and adhesions were taken down.  I again evaluated the cord structure on the left and found no evidence of indirect hernia.  I then brought a left-sided piece of Bard 3DMax Prolene mesh onto the field.  I placed it through the umbilical port and opened as [place] on the left inguinal floor.  I then tacked it to Cooper's ligament up the medial abdominal wall slightly laterally with the absorbable tacker.  Next, I brought a right-sided piece of Prolene 3DMax mesh onto the field.  I placed it through the umbilical trocar and opened on the right inguinal floor.  I then tacked it to Cooper's ligament up the medial abdominal wall and out laterally with the absorbable tacker.  Wide coverage of the direct defect and cord structures appeared to be achieved.  At this point, hemostasis appeared to be achieved.  I then removed all ports and the preperitoneal space collapsed appropriately with the mesh in place.  At this point, I made the transverse incision at the umbilicus larger.  I then dissected out the large hernia sac with incarcerated omentum from the overlying umbilical skin.  I then opened the sac and reduced the omentum back into the abdominal cavity and excised the sac completely.  Next, a 6.4-cm round Ventralight ST patch was brought onto the field.  I placed it through the fascial opening and pulled it against the peritoneum with stay ties.  I then sewed the mesh circumferentially with interrupted #1 Novafil sutures.  The stay ties were then cut and I closed the fascia over the top of the mesh with a figure-of-eight #1 Novafil suture.  I then closed  the previous fascial defects from the inguinal hernia repair with the 4-0 Vicryl figure-of-eight suture as well.  I then tacked the umbilical skin back in place with 2-0 Vicryl suture.  I then anesthetized the fascia and incisions with [place].  I then closed the subcutaneous tissue with interrupted 3-0 Vicryl suture, I then closed all skin incisions with 4-0 Monocryl.  Skin glue was then applied.  The patient tolerated the procedure well.  All sponge, needle and instrument counts were correct at the end of procedure.  The patient was then extubated in the operating room and taken in a stable condition to the recovery room.     Coralie Keens, M.D.   ______________________________ Coralie Keens, M.D.    DB/MEDQ  D:  05/24/2016  T:  05/24/2016  Job:  [id number]

## 2016-05-26 ENCOUNTER — Encounter (HOSPITAL_BASED_OUTPATIENT_CLINIC_OR_DEPARTMENT_OTHER): Payer: Self-pay | Admitting: Surgery

## 2016-06-05 DIAGNOSIS — E6609 Other obesity due to excess calories: Secondary | ICD-10-CM | POA: Diagnosis not present

## 2016-06-05 DIAGNOSIS — Z6833 Body mass index (BMI) 33.0-33.9, adult: Secondary | ICD-10-CM | POA: Diagnosis not present

## 2016-06-05 DIAGNOSIS — J209 Acute bronchitis, unspecified: Secondary | ICD-10-CM | POA: Diagnosis not present

## 2016-06-05 DIAGNOSIS — M0689 Other specified rheumatoid arthritis, multiple sites: Secondary | ICD-10-CM | POA: Diagnosis not present

## 2016-06-05 DIAGNOSIS — J9801 Acute bronchospasm: Secondary | ICD-10-CM | POA: Diagnosis not present

## 2016-06-05 DIAGNOSIS — J0181 Other acute recurrent sinusitis: Secondary | ICD-10-CM | POA: Diagnosis not present

## 2016-06-13 DIAGNOSIS — M5441 Lumbago with sciatica, right side: Secondary | ICD-10-CM | POA: Diagnosis not present

## 2016-06-13 DIAGNOSIS — Z79899 Other long term (current) drug therapy: Secondary | ICD-10-CM | POA: Diagnosis not present

## 2016-06-13 DIAGNOSIS — M2550 Pain in unspecified joint: Secondary | ICD-10-CM | POA: Diagnosis not present

## 2016-06-13 DIAGNOSIS — M0579 Rheumatoid arthritis with rheumatoid factor of multiple sites without organ or systems involvement: Secondary | ICD-10-CM | POA: Diagnosis not present

## 2016-07-05 DIAGNOSIS — M779 Enthesopathy, unspecified: Secondary | ICD-10-CM | POA: Diagnosis not present

## 2016-07-05 DIAGNOSIS — E119 Type 2 diabetes mellitus without complications: Secondary | ICD-10-CM | POA: Diagnosis not present

## 2016-07-05 DIAGNOSIS — Z23 Encounter for immunization: Secondary | ICD-10-CM | POA: Diagnosis not present

## 2016-07-05 DIAGNOSIS — Z6834 Body mass index (BMI) 34.0-34.9, adult: Secondary | ICD-10-CM | POA: Diagnosis not present

## 2016-07-27 DIAGNOSIS — Z6834 Body mass index (BMI) 34.0-34.9, adult: Secondary | ICD-10-CM | POA: Diagnosis not present

## 2016-07-27 DIAGNOSIS — J209 Acute bronchitis, unspecified: Secondary | ICD-10-CM | POA: Diagnosis not present

## 2016-07-27 DIAGNOSIS — E6609 Other obesity due to excess calories: Secondary | ICD-10-CM | POA: Diagnosis not present

## 2016-07-27 DIAGNOSIS — J069 Acute upper respiratory infection, unspecified: Secondary | ICD-10-CM | POA: Diagnosis not present

## 2016-08-02 ENCOUNTER — Other Ambulatory Visit: Payer: Self-pay | Admitting: Cardiovascular Disease

## 2016-08-08 DIAGNOSIS — E669 Obesity, unspecified: Secondary | ICD-10-CM | POA: Diagnosis not present

## 2016-08-08 DIAGNOSIS — Z1389 Encounter for screening for other disorder: Secondary | ICD-10-CM | POA: Diagnosis not present

## 2016-08-08 DIAGNOSIS — E6609 Other obesity due to excess calories: Secondary | ICD-10-CM | POA: Diagnosis not present

## 2016-08-08 DIAGNOSIS — Z6834 Body mass index (BMI) 34.0-34.9, adult: Secondary | ICD-10-CM | POA: Diagnosis not present

## 2016-08-08 DIAGNOSIS — L03116 Cellulitis of left lower limb: Secondary | ICD-10-CM | POA: Diagnosis not present

## 2016-08-08 DIAGNOSIS — I739 Peripheral vascular disease, unspecified: Secondary | ICD-10-CM | POA: Diagnosis not present

## 2016-08-08 DIAGNOSIS — M0689 Other specified rheumatoid arthritis, multiple sites: Secondary | ICD-10-CM | POA: Diagnosis not present

## 2016-08-08 DIAGNOSIS — I200 Unstable angina: Secondary | ICD-10-CM | POA: Diagnosis not present

## 2016-08-21 DIAGNOSIS — E1165 Type 2 diabetes mellitus with hyperglycemia: Secondary | ICD-10-CM | POA: Diagnosis not present

## 2016-08-21 DIAGNOSIS — R201 Hypoesthesia of skin: Secondary | ICD-10-CM | POA: Diagnosis not present

## 2016-08-21 DIAGNOSIS — L84 Corns and callosities: Secondary | ICD-10-CM | POA: Diagnosis not present

## 2016-08-21 DIAGNOSIS — I739 Peripheral vascular disease, unspecified: Secondary | ICD-10-CM | POA: Diagnosis not present

## 2016-08-21 DIAGNOSIS — I872 Venous insufficiency (chronic) (peripheral): Secondary | ICD-10-CM | POA: Diagnosis not present

## 2016-08-21 DIAGNOSIS — I2510 Atherosclerotic heart disease of native coronary artery without angina pectoris: Secondary | ICD-10-CM | POA: Diagnosis not present

## 2016-08-21 DIAGNOSIS — G894 Chronic pain syndrome: Secondary | ICD-10-CM | POA: Diagnosis not present

## 2016-08-21 DIAGNOSIS — E669 Obesity, unspecified: Secondary | ICD-10-CM | POA: Diagnosis not present

## 2016-08-21 DIAGNOSIS — Z6834 Body mass index (BMI) 34.0-34.9, adult: Secondary | ICD-10-CM | POA: Diagnosis not present

## 2016-09-13 DIAGNOSIS — E1165 Type 2 diabetes mellitus with hyperglycemia: Secondary | ICD-10-CM | POA: Diagnosis not present

## 2016-09-13 DIAGNOSIS — Z794 Long term (current) use of insulin: Secondary | ICD-10-CM | POA: Diagnosis not present

## 2016-09-13 DIAGNOSIS — H524 Presbyopia: Secondary | ICD-10-CM | POA: Diagnosis not present

## 2016-09-13 DIAGNOSIS — H52203 Unspecified astigmatism, bilateral: Secondary | ICD-10-CM | POA: Diagnosis not present

## 2016-09-13 DIAGNOSIS — E113292 Type 2 diabetes mellitus with mild nonproliferative diabetic retinopathy without macular edema, left eye: Secondary | ICD-10-CM | POA: Diagnosis not present

## 2016-09-13 DIAGNOSIS — H2513 Age-related nuclear cataract, bilateral: Secondary | ICD-10-CM | POA: Diagnosis not present

## 2016-09-13 DIAGNOSIS — H5213 Myopia, bilateral: Secondary | ICD-10-CM | POA: Diagnosis not present

## 2016-09-14 DIAGNOSIS — Z6833 Body mass index (BMI) 33.0-33.9, adult: Secondary | ICD-10-CM | POA: Diagnosis not present

## 2016-09-14 DIAGNOSIS — M2550 Pain in unspecified joint: Secondary | ICD-10-CM | POA: Diagnosis not present

## 2016-09-14 DIAGNOSIS — M0579 Rheumatoid arthritis with rheumatoid factor of multiple sites without organ or systems involvement: Secondary | ICD-10-CM | POA: Diagnosis not present

## 2016-09-14 DIAGNOSIS — E669 Obesity, unspecified: Secondary | ICD-10-CM | POA: Diagnosis not present

## 2016-09-14 DIAGNOSIS — M5441 Lumbago with sciatica, right side: Secondary | ICD-10-CM | POA: Diagnosis not present

## 2016-09-14 DIAGNOSIS — Z79899 Other long term (current) drug therapy: Secondary | ICD-10-CM | POA: Diagnosis not present

## 2016-09-18 DIAGNOSIS — I5042 Chronic combined systolic (congestive) and diastolic (congestive) heart failure: Secondary | ICD-10-CM | POA: Diagnosis not present

## 2016-09-18 DIAGNOSIS — M0689 Other specified rheumatoid arthritis, multiple sites: Secondary | ICD-10-CM | POA: Diagnosis not present

## 2016-09-18 DIAGNOSIS — I10 Essential (primary) hypertension: Secondary | ICD-10-CM | POA: Diagnosis not present

## 2016-09-18 DIAGNOSIS — Z6834 Body mass index (BMI) 34.0-34.9, adult: Secondary | ICD-10-CM | POA: Diagnosis not present

## 2016-09-18 DIAGNOSIS — E119 Type 2 diabetes mellitus without complications: Secondary | ICD-10-CM | POA: Diagnosis not present

## 2016-09-18 DIAGNOSIS — E6609 Other obesity due to excess calories: Secondary | ICD-10-CM | POA: Diagnosis not present

## 2016-09-18 DIAGNOSIS — R42 Dizziness and giddiness: Secondary | ICD-10-CM | POA: Diagnosis not present

## 2016-09-18 DIAGNOSIS — I739 Peripheral vascular disease, unspecified: Secondary | ICD-10-CM | POA: Diagnosis not present

## 2016-09-29 ENCOUNTER — Other Ambulatory Visit (HOSPITAL_COMMUNITY): Payer: Self-pay | Admitting: Internal Medicine

## 2016-10-08 ENCOUNTER — Encounter (HOSPITAL_COMMUNITY): Payer: Self-pay | Admitting: Emergency Medicine

## 2016-10-08 ENCOUNTER — Emergency Department (HOSPITAL_BASED_OUTPATIENT_CLINIC_OR_DEPARTMENT_OTHER)
Admit: 2016-10-08 | Discharge: 2016-10-08 | Disposition: A | Discharge status: Home / Self Care | Payer: Medicare HMO | Attending: Emergency Medicine | Admitting: Emergency Medicine

## 2016-10-08 ENCOUNTER — Observation Stay (HOSPITAL_COMMUNITY)
Admission: EM | Admit: 2016-10-08 | Discharge: 2016-10-09 | Disposition: A | Discharge status: Home / Self Care | Payer: Medicare HMO | Attending: Internal Medicine | Admitting: Internal Medicine

## 2016-10-08 DIAGNOSIS — L0390 Cellulitis, unspecified: Secondary | ICD-10-CM | POA: Diagnosis present

## 2016-10-08 DIAGNOSIS — M79605 Pain in left leg: Secondary | ICD-10-CM | POA: Diagnosis not present

## 2016-10-08 DIAGNOSIS — E1165 Type 2 diabetes mellitus with hyperglycemia: Secondary | ICD-10-CM | POA: Diagnosis not present

## 2016-10-08 DIAGNOSIS — Z794 Long term (current) use of insulin: Secondary | ICD-10-CM | POA: Diagnosis not present

## 2016-10-08 DIAGNOSIS — I252 Old myocardial infarction: Secondary | ICD-10-CM | POA: Diagnosis not present

## 2016-10-08 DIAGNOSIS — I255 Ischemic cardiomyopathy: Secondary | ICD-10-CM | POA: Insufficient documentation

## 2016-10-08 DIAGNOSIS — E1151 Type 2 diabetes mellitus with diabetic peripheral angiopathy without gangrene: Secondary | ICD-10-CM | POA: Insufficient documentation

## 2016-10-08 DIAGNOSIS — M069 Rheumatoid arthritis, unspecified: Secondary | ICD-10-CM | POA: Diagnosis not present

## 2016-10-08 DIAGNOSIS — Z7982 Long term (current) use of aspirin: Secondary | ICD-10-CM | POA: Diagnosis not present

## 2016-10-08 DIAGNOSIS — J449 Chronic obstructive pulmonary disease, unspecified: Secondary | ICD-10-CM | POA: Diagnosis present

## 2016-10-08 DIAGNOSIS — L03116 Cellulitis of left lower limb: Principal | ICD-10-CM | POA: Diagnosis present

## 2016-10-08 DIAGNOSIS — F419 Anxiety disorder, unspecified: Secondary | ICD-10-CM | POA: Diagnosis not present

## 2016-10-08 DIAGNOSIS — E785 Hyperlipidemia, unspecified: Secondary | ICD-10-CM | POA: Diagnosis present

## 2016-10-08 DIAGNOSIS — D696 Thrombocytopenia, unspecified: Secondary | ICD-10-CM | POA: Insufficient documentation

## 2016-10-08 DIAGNOSIS — Z79899 Other long term (current) drug therapy: Secondary | ICD-10-CM | POA: Diagnosis not present

## 2016-10-08 DIAGNOSIS — G252 Other specified forms of tremor: Secondary | ICD-10-CM | POA: Diagnosis not present

## 2016-10-08 DIAGNOSIS — M79609 Pain in unspecified limb: Secondary | ICD-10-CM | POA: Diagnosis not present

## 2016-10-08 DIAGNOSIS — E1140 Type 2 diabetes mellitus with diabetic neuropathy, unspecified: Secondary | ICD-10-CM | POA: Insufficient documentation

## 2016-10-08 DIAGNOSIS — Z955 Presence of coronary angioplasty implant and graft: Secondary | ICD-10-CM | POA: Insufficient documentation

## 2016-10-08 DIAGNOSIS — R739 Hyperglycemia, unspecified: Secondary | ICD-10-CM

## 2016-10-08 DIAGNOSIS — I5042 Chronic combined systolic (congestive) and diastolic (congestive) heart failure: Secondary | ICD-10-CM | POA: Diagnosis not present

## 2016-10-08 DIAGNOSIS — Z87891 Personal history of nicotine dependence: Secondary | ICD-10-CM | POA: Diagnosis not present

## 2016-10-08 DIAGNOSIS — M7989 Other specified soft tissue disorders: Secondary | ICD-10-CM

## 2016-10-08 DIAGNOSIS — I110 Hypertensive heart disease with heart failure: Secondary | ICD-10-CM | POA: Insufficient documentation

## 2016-10-08 DIAGNOSIS — Z9861 Coronary angioplasty status: Secondary | ICD-10-CM

## 2016-10-08 DIAGNOSIS — I2510 Atherosclerotic heart disease of native coronary artery without angina pectoris: Secondary | ICD-10-CM | POA: Diagnosis not present

## 2016-10-08 DIAGNOSIS — E119 Type 2 diabetes mellitus without complications: Secondary | ICD-10-CM

## 2016-10-08 DIAGNOSIS — I739 Peripheral vascular disease, unspecified: Secondary | ICD-10-CM | POA: Diagnosis present

## 2016-10-08 DIAGNOSIS — E7800 Pure hypercholesterolemia, unspecified: Secondary | ICD-10-CM | POA: Diagnosis not present

## 2016-10-08 DIAGNOSIS — I10 Essential (primary) hypertension: Secondary | ICD-10-CM | POA: Diagnosis present

## 2016-10-08 HISTORY — DX: Cellulitis of left lower limb: L03.116

## 2016-10-08 LAB — COMPREHENSIVE METABOLIC PANEL
ALBUMIN: 3.3 g/dL — AB (ref 3.5–5.0)
ALK PHOS: 79 U/L (ref 38–126)
ALT: 42 U/L (ref 17–63)
AST: 30 U/L (ref 15–41)
Anion gap: 9 (ref 5–15)
BILIRUBIN TOTAL: 0.5 mg/dL (ref 0.3–1.2)
BUN: 12 mg/dL (ref 6–20)
CALCIUM: 9.4 mg/dL (ref 8.9–10.3)
CO2: 23 mmol/L (ref 22–32)
CREATININE: 0.97 mg/dL (ref 0.61–1.24)
Chloride: 103 mmol/L (ref 101–111)
GFR calc Af Amer: >60 mL/min (ref >60)
GLUCOSE: 308 mg/dL — AB (ref 65–99)
POTASSIUM: 4.8 mmol/L (ref 3.5–5.1)
Sodium: 135 mmol/L (ref 135–145)
TOTAL PROTEIN: 6 g/dL — AB (ref 6.5–8.1)

## 2016-10-08 LAB — CBC WITH DIFFERENTIAL/PLATELET
BASOS ABS: 0 10*3/uL (ref 0.0–0.1)
BASOS PCT: 0 %
Eosinophils Absolute: 0.1 10*3/uL (ref 0.0–0.7)
Eosinophils Relative: 1 %
HEMATOCRIT: 47.5 % (ref 39.0–52.0)
HEMOGLOBIN: 16.7 g/dL (ref 13.0–17.0)
LYMPHS PCT: 11 %
Lymphs Abs: 1.1 10*3/uL (ref 0.7–4.0)
MCH: 32.9 pg (ref 26.0–34.0)
MCHC: 35.2 g/dL (ref 30.0–36.0)
MCV: 93.5 fL (ref 78.0–100.0)
Monocytes Absolute: 0.7 10*3/uL (ref 0.1–1.0)
Monocytes Relative: 7 %
NEUTROS ABS: 7.8 10*3/uL — AB (ref 1.7–7.7)
NEUTROS PCT: 81 %
Platelets: 131 10*3/uL — ABNORMAL LOW (ref 150–400)
RBC: 5.08 MIL/uL (ref 4.22–5.81)
RDW: 14.5 % (ref 11.5–15.5)
WBC: 9.8 10*3/uL (ref 4.0–10.5)

## 2016-10-08 LAB — GLUCOSE, CAPILLARY
Glucose-Capillary: 288 mg/dL — ABNORMAL HIGH (ref 65–99)
Glucose-Capillary: 326 mg/dL — ABNORMAL HIGH (ref 65–99)

## 2016-10-08 LAB — PROTIME-INR
INR: 1.02
PROTHROMBIN TIME: 13.4 s (ref 11.4–15.2)

## 2016-10-08 MED ORDER — CEFAZOLIN SODIUM-DEXTROSE 2-4 GM/100ML-% IV SOLN
2.0000 g | Freq: Three times a day (TID) | INTRAVENOUS | Status: DC
  Administered 2016-10-08 – 2016-10-09 (×3): 2 g via INTRAVENOUS
  Filled 2016-10-08 (×4): qty 100

## 2016-10-08 MED ORDER — QUINAPRIL HCL 10 MG PO TABS
30.0000 mg | ORAL_TABLET | Freq: Every day | ORAL | Status: DC
  Administered 2016-10-08: 30 mg via ORAL
  Filled 2016-10-08 (×2): qty 3

## 2016-10-08 MED ORDER — PRIMIDONE 50 MG PO TABS
50.0000 mg | ORAL_TABLET | Freq: Every day | ORAL | Status: DC
  Administered 2016-10-08 – 2016-10-09 (×2): 50 mg via ORAL
  Filled 2016-10-08 (×2): qty 1

## 2016-10-08 MED ORDER — ASPIRIN 81 MG PO CHEW
81.0000 mg | CHEWABLE_TABLET | Freq: Every day | ORAL | Status: DC
  Administered 2016-10-08 – 2016-10-09 (×2): 81 mg via ORAL
  Filled 2016-10-08 (×2): qty 1

## 2016-10-08 MED ORDER — INSULIN ASPART 100 UNIT/ML ~~LOC~~ SOLN
0.0000 [IU] | Freq: Three times a day (TID) | SUBCUTANEOUS | Status: DC
Start: 2016-10-08 — End: 2016-10-09
  Administered 2016-10-08 – 2016-10-09 (×2): 8 [IU] via SUBCUTANEOUS
  Administered 2016-10-09: 2 [IU] via SUBCUTANEOUS

## 2016-10-08 MED ORDER — OMEGA-3 FATTY ACIDS 1000 MG PO CAPS
1.0000 g | ORAL_CAPSULE | Freq: Every day | ORAL | Status: DC
  Administered 2016-10-08: 1 g via ORAL
  Filled 2016-10-08 (×2): qty 1

## 2016-10-08 MED ORDER — CEFAZOLIN IN D5W 1 GM/50ML IV SOLN
1.0000 g | Freq: Once | INTRAVENOUS | Status: AC
  Administered 2016-10-08: 1 g via INTRAVENOUS
  Filled 2016-10-08: qty 50

## 2016-10-08 MED ORDER — METOPROLOL TARTRATE 25 MG PO TABS
25.0000 mg | ORAL_TABLET | Freq: Two times a day (BID) | ORAL | Status: DC
  Administered 2016-10-08 – 2016-10-09 (×2): 25 mg via ORAL
  Filled 2016-10-08 (×2): qty 1

## 2016-10-08 MED ORDER — INSULIN ASPART 100 UNIT/ML ~~LOC~~ SOLN
0.0000 [IU] | Freq: Every day | SUBCUTANEOUS | Status: DC
Start: 2016-10-08 — End: 2016-10-09
  Administered 2016-10-08: 4 [IU] via SUBCUTANEOUS

## 2016-10-08 MED ORDER — ATORVASTATIN CALCIUM 80 MG PO TABS
80.0000 mg | ORAL_TABLET | Freq: Every day | ORAL | Status: DC
  Administered 2016-10-08: 80 mg via ORAL
  Filled 2016-10-08: qty 1

## 2016-10-08 MED ORDER — ACETAMINOPHEN 325 MG PO TABS: 650.0000 mg | ORAL_TABLET | Freq: Four times a day (QID) | ORAL | Status: DC | PRN

## 2016-10-08 MED ORDER — INSULIN GLARGINE 100 UNIT/ML ~~LOC~~ SOLN
50.0000 [IU] | Freq: Every day | SUBCUTANEOUS | Status: DC
  Administered 2016-10-08: 50 [IU] via SUBCUTANEOUS
  Filled 2016-10-08: qty 0.5

## 2016-10-08 MED ORDER — ACETAMINOPHEN 650 MG RE SUPP: 650.0000 mg | Freq: Four times a day (QID) | RECTAL | Status: DC | PRN

## 2016-10-08 MED ORDER — ENOXAPARIN SODIUM 40 MG/0.4ML ~~LOC~~ SOLN
40.0000 mg | SUBCUTANEOUS | Status: DC
  Administered 2016-10-08: 40 mg via SUBCUTANEOUS
  Filled 2016-10-08: qty 0.4

## 2016-10-08 MED ORDER — FOLIC ACID 1 MG PO TABS
1.0000 mg | ORAL_TABLET | Freq: Every day | ORAL | Status: DC
  Administered 2016-10-08 – 2016-10-09 (×2): 1 mg via ORAL
  Filled 2016-10-08 (×2): qty 1

## 2016-10-08 MED ORDER — ISOSORBIDE MONONITRATE ER 60 MG PO TB24
120.0000 mg | ORAL_TABLET | Freq: Every day | ORAL | Status: DC
  Administered 2016-10-08 – 2016-10-09 (×2): 120 mg via ORAL
  Filled 2016-10-08 (×2): qty 2

## 2016-10-08 NOTE — ED Provider Notes (Addendum)
[facility] DEPT Provider Note   CSN: [id number] Arrival date & time: 10/08/16  1112   By signing my name below, I, Avnee Patel, attest that this documentation has been prepared under the direction and in the presence of Pattricia Boss, MD  Electronically Signed: Delton Prairie, ED Scribe. 10/08/16. 12:51 PM.   History   Chief Complaint Chief Complaint  Patient presents with  . Leg Swelling    The history is provided by the patient. No language interpreter was used.   HPI Comments:  Robert [name] is a 65 y.o. male, with a hx of DM, diabetic neuropathy, COPD, CAD and MI, who presents to the Emergency Department complaining of acute onset left leg swelling x 1.5 week. He also reports redness to his left leg x 2 days and a cough which is his baseline. No alleviating factors noted. Pt denies a hx of blood clots in his legs, fevers, chills, nausea, vomiting, appetite change, decrease in fluid intake, congestion, SOB, chest pain, current blood thinner use and any other associated symptoms. Pt is a smoker. He is on metformin and insulin. Pt states he checks his BP in the AM everyday but did not today.   PCP: Glo Herring., MD  Cardiologist: Dr. Sanda Klein    Past Medical History:  Diagnosis Date  . Anxiety   . COPD (chronic obstructive pulmonary disease) ([place])   . Coronary artery disease   . Full dentures   . GERD (gastroesophageal reflux disease)    Rolaids as needed  . High cholesterol   . History of MI (myocardial infarction)   . Hypertension    med. dosage increased 04/2016; has been on BP med. x 10 yrs.  . Incarcerated umbilical hernia 0000000  . Inguinal hernia 05/2016   bilateral   . Insulin dependent diabetes mellitus ([place])   . Ischemic cardiomyopathy    a. 03/2015 EF 40-45% by LV gram.  . Rheumatoid arthritis(714.0)     Patient Active Problem List   Diagnosis Date Noted  . Chronic combined systolic and diastolic CHF (congestive heart failure) ([place])  04/25/2016  . Coronary artery disease   . High cholesterol   . Type II diabetes mellitus ([place])   . Essential hypertension   . Non-STEMI (non-ST elevated myocardial infarction) ([place])   . CAD S/P percutaneous coronary angioplasty   . Unstable angina ([place]) 03/20/2015  . ICM-EF 35% at cath 02/01/15 02/02/2015  . CAD (coronary artery disease) 01/09/2015  . DM type 2 causing vascular disease, not at goal [facility]) 05/29/2014  . Dyspnea 10/20/2013  . COPD (chronic obstructive pulmonary disease) ([place]) 09/03/2013  . Hyperlipidemia   . Old myocardial infarction 09/01/2013  . Abnormal nuclear stress test 05/17/2012  . S/P CFX PTCAI for ISR 02/01/15 05/17/2012  . PVD, chronic LLE 12/22/2011  . Rheumatoid arthritis ([place]) 12/22/2011  . Contrast media allergy 12/22/2011  . Type II IDDM 12/21/2011  . HTN (hypertension) 12/21/2011  . Tobacco abuse 12/21/2011    Past Surgical History:  Procedure Laterality Date  . CARDIAC CATHETERIZATION N/A 02/01/2015   Procedure: Left Heart Cath and Coronary Angiography;  Surgeon: Belva Crome, MD;  Location: [facility] CV LAB;  Service: Cardiovascular;  Laterality: N/A;  . CARDIAC CATHETERIZATION N/A 03/22/2015   Procedure: Left Heart Cath and Coronary Angiography;  Surgeon: Leonie Man, MD;  Location: [facility] LAB;  Service: Cardiovascular;  Laterality: N/A;  . CARDIAC CATHETERIZATION  09/05/2002; 03/09/2003; 04/10/2005;06/02/2005; 05/09/2006; 02/09/2007; 03/01/2007  . CORONARY ANGIOPLASTY  11/14/2012;  02/01/2015  . CORONARY ANGIOPLASTY WITH STENT PLACEMENT  05/16/2012   "1; makes total ~ 4"  . INSERTION OF MESH Bilateral 05/24/2016   Procedure: INSERTION OF MESH;  Surgeon: Coralie Keens, MD;  Location: [facility];  Service: General;  Laterality: Bilateral;  . LAPAROSCOPIC CHOLECYSTECTOMY    . LAPAROSCOPIC INGUINAL HERNIA WITH UMBILICAL HERNIA Bilateral 05/24/2016   Procedure: BILATERAL LAPAROSCOPIC INGUINAL HERNIA REPAIR WITH MESH AND UMBILICAL  HERNIA REPAIR WITH MESH;  Surgeon: Coralie Keens, MD;  Location: [facility];  Service: General;  Laterality: Bilateral;  . LEFT HEART CATHETERIZATION WITH CORONARY ANGIOGRAM N/A 12/22/2011   Procedure: LEFT HEART CATHETERIZATION WITH CORONARY ANGIOGRAM;  Surgeon: Troy Sine, MD;  Location: [facility] CATH LAB;  Service: Cardiovascular;  Laterality: N/A;  . LEFT HEART CATHETERIZATION WITH CORONARY ANGIOGRAM Bilateral 05/16/2012   Procedure: LEFT HEART CATHETERIZATION WITH CORONARY ANGIOGRAM;  Surgeon: Lorretta Harp, MD;  Location: [facility] CATH LAB;  Service: Cardiovascular;  Laterality: Bilateral;  . LEFT HEART CATHETERIZATION WITH CORONARY ANGIOGRAM N/A 11/14/2012   Procedure: LEFT HEART CATHETERIZATION WITH CORONARY ANGIOGRAM;  Surgeon: Troy Sine, MD;  Location: [facility] CATH LAB;  Service: Cardiovascular;  Laterality: N/A;  . LEFT HEART CATHETERIZATION WITH CORONARY ANGIOGRAM N/A 09/01/2013   Procedure: LEFT HEART CATHETERIZATION WITH CORONARY ANGIOGRAM;  Surgeon: Leonie Man, MD;  Location: [facility] CATH LAB;  Service: Cardiovascular;  Laterality: N/A;  . [place]; 1973; 1985   x 3  . NM MYOCAR PERF WALL MOTION  08/30/2009   No significant ischemia  . PERCUTANEOUS CORONARY INTERVENTION-BALLOON ONLY  12/22/2011   Procedure: PERCUTANEOUS CORONARY INTERVENTION-BALLOON ONLY;  Surgeon: Troy Sine, MD;  Location: [facility] CATH LAB;  Service: Cardiovascular;;  . PERCUTANEOUS CORONARY INTERVENTION-BALLOON ONLY  11/14/2012   Procedure: PERCUTANEOUS CORONARY INTERVENTION-BALLOON ONLY;  Surgeon: Troy Sine, MD;  Location: [facility] CATH LAB;  Service: Cardiovascular;;  . PERCUTANEOUS CORONARY STENT INTERVENTION (PCI-S)  05/16/2012   Procedure: PERCUTANEOUS CORONARY STENT INTERVENTION (PCI-S);  Surgeon: Lorretta Harp, MD;  Location: [facility] CATH LAB;  Service: Cardiovascular;;  . PERCUTANEOUS CORONARY STENT INTERVENTION (PCI-S)  09/01/2013   Procedure: PERCUTANEOUS CORONARY STENT INTERVENTION  (PCI-S);  Surgeon: Leonie Man, MD;  Location: [facility] CATH LAB;  Service: Cardiovascular;;     Home Medications    Prior to Admission medications   Medication Sig Start Date End Date Taking? Authorizing Provider  ALPRAZolam Duanne Moron) 0.5 MG tablet Take 0.5 mg by mouth 3 (three) times daily as needed. For anxiety    Historical Provider, MD  aspirin EC 81 MG EC tablet Take 1 tablet (81 mg total) by mouth daily. 11/15/12   Brett Canales, PA-C  atorvastatin (LIPITOR) 80 MG tablet Take 1 tablet (80 mg total) by mouth daily at 6 PM. 05/08/16   Mihai Croitoru, MD  fish oil-omega-3 fatty acids 1000 MG capsule Take 1 g by mouth daily.     Historical Provider, MD  folic acid (FOLVITE) 1 MG tablet Take 1 mg by mouth daily.     Historical Provider, MD  glipiZIDE (GLUCOTROL XL) 10 MG 24 hr tablet Take 10 mg by mouth 2 (two) times daily.    Historical Provider, MD  hydroxychloroquine (PLAQUENIL) 200 MG tablet Take 200 mg by mouth 2 (two) times daily.    Historical Provider, MD  Insulin Degludec (TRESIBA FLEXTOUCH Speculator) Inject 25 Units into the skin daily.     Historical Provider, MD  isosorbide mononitrate (IMDUR) 60 MG 24 hr tablet Take  90 mg by mouth daily.    Historical Provider, MD  metFORMIN (GLUCOPHAGE) 1000 MG tablet Take 1 tablet (1,000 mg total) by mouth 2 (two) times daily with a meal. 03/23/15   Bhavinkumar Bhagat, PA  metoprolol tartrate (LOPRESSOR) 25 MG tablet TAKE 1 TABLET (25 MG TOTAL) BY MOUTH 2 (TWO) TIMES DAILY. PLEASE CONTACT OFFICE FOR ADDITIONAL REFILLS 04/11/16   Sanda Klein, MD  nitroGLYCERIN (NITROSTAT) 0.4 MG SL tablet Place 1 tablet (0.4 mg total) under the tongue every 5 (five) minutes x 3 doses as needed for chest pain. 04/24/16   Mihai Croitoru, MD  oxyCODONE-acetaminophen (ROXICET) 5-325 MG tablet Take 1-2 tablets by mouth every 4 (four) hours as needed for moderate pain or severe pain. 05/24/16   Coralie Keens, MD  primidone (MYSOLINE) 50 MG tablet Take 50 mg by mouth daily.     Historical Provider, MD  quinapril (ACCUPRIL) 20 MG tablet Take 30 mg by mouth at bedtime.    Historical Provider, MD  quinapril (ACCUPRIL) 40 MG tablet TAKE 1/2 TABLET AT BEDTIME 08/02/16   Mihai Croitoru, MD  [name] HCl [phone] MG TB24 Take 1 tablet by mouth daily.    Historical Provider, MD  sulfaSALAzine (AZULFIDINE) 500 MG tablet Take 500 mg by mouth 2 (two) times daily.    Historical Provider, MD    Family History Family History  Problem Relation Age of Onset  . Colon cancer Father   . Hypertension Mother   . Heart disease Mother   . Cancer Mother     Social History Social History  Substance Use Topics  . Smoking status: Former Smoker    Packs/day: 0.75    Quit date: 06/11/2015  . Smokeless tobacco: Never Used  . Alcohol use No     Allergies   Ivp dye [iodinated diagnostic agents] and Plavix [clopidogrel bisulfate]   Review of Systems Review of Systems  Constitutional: Negative for chills and fever.  HENT: Negative for congestion.   Respiratory: Positive for cough. Negative for shortness of breath.   Cardiovascular: Positive for leg swelling. Negative for chest pain.  Gastrointestinal: Negative for nausea and vomiting.  Skin: Positive for color change.  All other systems reviewed and are negative.    Physical Exam Updated Vital Signs BP 152/76   Pulse 77   Temp 97.7 F (36.5 C)   Resp 18   Ht 6' (1.829 m)   Wt 245 lb (111.1 kg)   SpO2 96%   BMI 33.23 kg/m   Physical Exam  Constitutional: He is oriented to person, place, and time. He appears well-developed and well-nourished. No distress.  HENT:  Head: Normocephalic and atraumatic.  Eyes: Conjunctivae are normal.  Cardiovascular: Normal rate.   Pulmonary/Chest: Effort normal.  Abdominal: Soft. Bowel sounds are normal. He exhibits no distension.  Musculoskeletal:  Swelling erythema left lower extremity, with abrasion posterior heel, pedal edema with ttp, dp intact  Neurological: He  is alert and oriented to person, place, and time.  Skin: Skin is warm and dry.  Psychiatric: He has a normal mood and affect.  Nursing note and vitals reviewed.  ED Treatments / Results  DIAGNOSTIC STUDIES:  Oxygen Saturation is 96% on RA, normal by my interpretation.    COORDINATION OF CARE:  12:45 PM Will order lab work. Discussed treatment plan with pt at bedside and pt agreed to plan. 2:06 PM IV antibiotics and admit to hospital. No DVT 2:29 PM Consulted resident Jule Ser. Pt will be admitted for cellulitis, hyperglycemia and  DM without DKA.    Labs (all labs ordered are listed, but only abnormal results are displayed) Labs Reviewed - No data to display  EKG  EKG Interpretation None       Radiology No results found.  Procedures Procedures (including critical care time)  Medications Ordered in ED Medications - No data to display   Initial Impression / Assessment and Plan / ED Course  I have reviewed the triage vital signs and the nursing notes.  Pertinent labs & imaging results that were available during my care of the patient were reviewed by me and considered in my medical decision making (see chart for details).       Final Clinical Impressions(s) / ED Diagnoses   Final diagnoses:  Cellulitis, unspecified cellulitis site  Hyperglycemia    New Prescriptions New Prescriptions   No medications on file  I personally performed the services described in this documentation, which was scribed in my presence. The recorded information has been reviewed and considered.    Pattricia Boss, MD 10/08/16 1459    Pattricia Boss, MD 10/08/16 [phone]

## 2016-10-08 NOTE — H&P (Signed)
Date: 10/08/2016               Patient Name:  Robert [name] MRN: [id number]  DOB: [birth year] Age / Sex: 66 y.o., male   PCP: Redmond School, MD         Medical Service: Internal Medicine Teaching Service         Attending Physician: Dr. Lucious Groves, DO    First Contact: Dr. Hetty [name]  Pager: [phone]  Second Contact: Dr. Juleen [name] Pager: [phone]       After Hours (After 5p/  First Contact Pager: [phone]  weekends / holidays): Second Contact Pager: [phone]   Chief Complaint: left leg swelling   History of Present Illness: [name] is a 66 y.o. male with a PMH of type two diabetes, rheumatoid arthritis, CAD (s/p 5 stents on ASA) who presents with left lower extremity swelling, redness, and warmth. For the past week he has had left foot swelling, on Friday morning he noticed the swelling moved up his leg and  that his lower leg became red and warm to touch. At that time his wife also noticed that he had a open wound at the back of his left ankle. He has had diabetes for 15 years with associated peripheral neuropathy and does not have sensation in his legs up to the level of his mid calf. His wife helps him to check his feet regularly for sores. He denies chills, sweating, or palpitations. He denies any recent car or plane rides, he stays active throughout the day by visiting his neighbor across the street. He denies shortness of breath. He does not have a known cancer diagnosis.   Meds:  Current Meds  Medication Sig  . ALPRAZolam (XANAX) 0.5 MG tablet Take 0.5 mg by mouth 3 (three) times daily as needed. For anxiety  . aspirin EC 81 MG EC tablet Take 1 tablet (81 mg total) by mouth daily.  [name] atorvastatin (LIPITOR) 80 MG tablet Take 1 tablet (80 mg total) by mouth daily at 6 PM.  . fish oil-omega-3 fatty acids 1000 MG capsule Take 1 g by mouth daily.   . folic acid (FOLVITE) 1 MG tablet Take 1 mg by mouth daily.   [name] glipiZIDE (GLUCOTROL XL) 10 MG 24 hr tablet Take 10 mg by  mouth 2 (two) times daily.  . Insulin Glargine (LANTUS SOLOSTAR) 100 UNIT/ML Solostar Pen Inject 80 Units into the skin daily at 10 pm.  . isosorbide mononitrate (IMDUR) 120 MG 24 hr tablet Take 120 mg by mouth daily.  . metFORMIN (GLUCOPHAGE) 1000 MG tablet Take 1 tablet (1,000 mg total) by mouth 2 (two) times daily with a meal.  . metoprolol tartrate (LOPRESSOR) 25 MG tablet TAKE 1 TABLET (25 MG TOTAL) BY MOUTH 2 (TWO) TIMES DAILY. PLEASE CONTACT OFFICE FOR ADDITIONAL REFILLS  . primidone (MYSOLINE) 50 MG tablet Take 50 mg by mouth daily.  . quinapril (ACCUPRIL) 40 MG tablet TAKE 1/2 TABLET AT BEDTIME  . quinapril (ACCUPRIL) 40 MG tablet Take 20 mg by mouth at bedtime. Patient takes with 10 mg for 30 mg dose  . [name] HCl [phone] MG TB24 Take 1 tablet by mouth daily.  . [DISCONTINUED] isosorbide mononitrate (IMDUR) 60 MG 24 hr tablet Take 90 mg by mouth daily.   Allergies: Allergies as of 10/08/2016 - Review Complete 10/08/2016  Allergen Reaction Noted  . Ivp dye [iodinated diagnostic agents] Itching and Rash 08/27/2011  . Plavix [clopidogrel bisulfate] Rash 08/27/2011  Past Medical History:  Diagnosis Date  . Anxiety   . COPD (chronic obstructive pulmonary disease) ([place])   . Coronary artery disease   . Full dentures   . GERD (gastroesophageal reflux disease)    Rolaids as needed  . High cholesterol   . History of MI (myocardial infarction)   . Hypertension    med. dosage increased 04/2016; has been on BP med. x 10 yrs.  . Incarcerated umbilical hernia 0000000  . Inguinal hernia 05/2016   bilateral   . Insulin dependent diabetes mellitus ([place])   . Ischemic cardiomyopathy    a. 03/2015 EF 40-45% by LV gram.  . Rheumatoid arthritis(714.0)    Family History:  Mother- metastatic colon ca   Social History:  Quit smoking cigarettes 1 year ago before that he smoked 2.5 ppd for the past 30 years. He denies alcohol or illicit drug use.   Review of Systems: A  complete ROS was negative except as per HPI.   Physical Exam: Blood pressure 152/76, pulse 73, temperature 97.7 F (36.5 C), resp. rate 18, height 6' (1.829 m), weight 245 lb (111.1 kg), SpO2 92 %. Physical Exam  Constitutional: He is oriented to person, place, and time. He appears well-developed and well-nourished. No distress.  HENT:  Head: Normocephalic and atraumatic.  Eyes: Conjunctivae are normal. No scleral icterus.  Cardiovascular: Normal rate and regular rhythm.   No murmur heard. Peripheral pulses intact bilateral,  1+ b/l lower extremity pitting edema   Pulmonary/Chest: Effort normal and breath sounds normal. No respiratory distress. He has no wheezes. He has no rales.  Active wet cough   Abdominal: Soft. He exhibits no distension. There is no tenderness.  Neurological: He is alert and oriented to person, place, and time.  B/l lower extremity sensation not intact to the level of the mid calf   Skin: Skin is warm and dry. He is not diaphoretic.  Left lower extremity erythematous and warm to touch with a healing scabbed over wound over his left heal.   Psychiatric: He has a normal mood and affect. His behavior is normal.       Labs  BMP Na 135, K 4.8, CO2 23, BUN 12, Crt 0.97, glucose 308 CBC WBC 9.8, Hgb 16.7, Plt 131   Lower extremity ultrasound - negative for DVT, enlarged inguinal lymph node and left calf with increased interstitial fluid.   Assessment & Plan by Problem:    Mild non purulent Cellulitis 66 year old with PMH of type two diabetes, rheumatoid arthritis, CAD (s/p 5 stents on ASA) who presented with acute onset left lower extremity swelling after sustaining injury to his left heel. This presentation in the setting of his poorly controlled diabetes is most concerning for mild non purulent cellulitis. There was concern for DVT given his unilateral leg swelling. He has [place] for PE score = 3 (clinical signs of DVT) but cellulitis is more likely especially given  negative lower extremity doppler in the ED. He is afebrile without leukocytosis at the time of presentation.  - ordered ancef  - follow up CBC     Uncontrolled Type II IDDM with peripheral neuropathy (HCC) Last A1c was 10 a month and a half ago. Describes poorly controlled diabetes. Home medications include metformin 1000mg  BID, glipizide 10 mg BID, sitagliptin-metformin 100-1000mg  q24h, and lantus 50 units qHS.  -follow up A1c  - ordered moderate ISS with HS coverage  - ordered lantus 50 units qHS  -monitor CBGs  HTN (hypertension) Currently normotensive.  - ordered home med quinapril 30 mg qd  - imdur and lopressor for CAD     Rheumatoid arthritis ([place]) Managed with methotrexate inj on thursdays and actemra (tocilizumab) inj on mondays. Therapy may need to be held until his infection resolves.     COPD (chronic obstructive pulmonary disease) (HCC) No prior PFTs in this EMR system. Reports that he is not on any therapy at home for this.     CAD S/P percutaneous coronary angioplasty   Hyperlipidemia - ordered home med ASA 81 mg qd, atorvastatin 80 mg qd, imdur 120 mg qd, and lopressor 25 mg BID   Resting tremor  - ordered home med primidone 50 mg qd   DVT Ppx lovenox subq Code Status FULL   Dispo: Admit patient to Observation with expected length of stay less than 2 midnights.  Signed: Ledell Noss, MD 10/08/2016, 5:24 PM  Pager: [phone]

## 2016-10-08 NOTE — ED Notes (Signed)
Pt dressed in hospital gown

## 2016-10-08 NOTE — ED Notes (Signed)
Gave pt Diet Coke, per Dr. Jeanell Sparrow.

## 2016-10-08 NOTE — ED Triage Notes (Signed)
Pt reports pain, redness and swelling to left calf x1 week, reports hx of PE, denies any recent travel or sob. resp e/u, nad.

## 2016-10-08 NOTE — Progress Notes (Signed)
VASCULAR LAB PRELIMINARY  PRELIMINARY  PRELIMINARY  PRELIMINARY  Left lower extremity venous duplex completed.    Preliminary report:  There is no DVT or SVT noted in the left lower extremity. There is an enlarged inguinal lymph node noted. There is interstitial fluid noted throughout left calf.  Called report to Dr. Cecilie Lowers, [facility], RVT 10/08/2016, 1:24 PM

## 2016-10-09 DIAGNOSIS — B9689 Other specified bacterial agents as the cause of diseases classified elsewhere: Secondary | ICD-10-CM

## 2016-10-09 DIAGNOSIS — L03116 Cellulitis of left lower limb: Secondary | ICD-10-CM | POA: Diagnosis not present

## 2016-10-09 DIAGNOSIS — E785 Hyperlipidemia, unspecified: Secondary | ICD-10-CM | POA: Diagnosis not present

## 2016-10-09 DIAGNOSIS — J449 Chronic obstructive pulmonary disease, unspecified: Secondary | ICD-10-CM | POA: Diagnosis not present

## 2016-10-09 DIAGNOSIS — I10 Essential (primary) hypertension: Secondary | ICD-10-CM | POA: Diagnosis not present

## 2016-10-09 DIAGNOSIS — M069 Rheumatoid arthritis, unspecified: Secondary | ICD-10-CM

## 2016-10-09 DIAGNOSIS — I2510 Atherosclerotic heart disease of native coronary artery without angina pectoris: Secondary | ICD-10-CM | POA: Diagnosis not present

## 2016-10-09 DIAGNOSIS — D696 Thrombocytopenia, unspecified: Secondary | ICD-10-CM | POA: Diagnosis not present

## 2016-10-09 DIAGNOSIS — Z91041 Radiographic dye allergy status: Secondary | ICD-10-CM

## 2016-10-09 DIAGNOSIS — R251 Tremor, unspecified: Secondary | ICD-10-CM | POA: Diagnosis not present

## 2016-10-09 DIAGNOSIS — Z7982 Long term (current) use of aspirin: Secondary | ICD-10-CM

## 2016-10-09 DIAGNOSIS — Z79899 Other long term (current) drug therapy: Secondary | ICD-10-CM

## 2016-10-09 DIAGNOSIS — Z955 Presence of coronary angioplasty implant and graft: Secondary | ICD-10-CM

## 2016-10-09 DIAGNOSIS — Z87891 Personal history of nicotine dependence: Secondary | ICD-10-CM

## 2016-10-09 DIAGNOSIS — E1142 Type 2 diabetes mellitus with diabetic polyneuropathy: Secondary | ICD-10-CM | POA: Diagnosis not present

## 2016-10-09 DIAGNOSIS — E1151 Type 2 diabetes mellitus with diabetic peripheral angiopathy without gangrene: Secondary | ICD-10-CM

## 2016-10-09 DIAGNOSIS — Z794 Long term (current) use of insulin: Secondary | ICD-10-CM

## 2016-10-09 DIAGNOSIS — Z888 Allergy status to other drugs, medicaments and biological substances status: Secondary | ICD-10-CM

## 2016-10-09 LAB — BASIC METABOLIC PANEL
Anion gap: 6 (ref 5–15)
BUN: 8 mg/dL (ref 6–20)
CALCIUM: 8.7 mg/dL — AB (ref 8.9–10.3)
CO2: 26 mmol/L (ref 22–32)
CREATININE: 0.97 mg/dL (ref 0.61–1.24)
Chloride: 104 mmol/L (ref 101–111)
Glucose, Bld: 151 mg/dL — ABNORMAL HIGH (ref 65–99)
Potassium: 3.5 mmol/L (ref 3.5–5.1)
SODIUM: 136 mmol/L (ref 135–145)

## 2016-10-09 LAB — CBC
HCT: 43 % (ref 39.0–52.0)
Hemoglobin: 14.8 g/dL (ref 13.0–17.0)
MCH: 32.3 pg (ref 26.0–34.0)
MCHC: 34.4 g/dL (ref 30.0–36.0)
MCV: 93.9 fL (ref 78.0–100.0)
PLATELETS: 97 10*3/uL — AB (ref 150–400)
RBC: 4.58 MIL/uL (ref 4.22–5.81)
RDW: 14.6 % (ref 11.5–15.5)
WBC: 8.5 10*3/uL (ref 4.0–10.5)

## 2016-10-09 LAB — HEMOGLOBIN A1C
HEMOGLOBIN A1C: 9.6 % — AB (ref 4.8–5.6)
MEAN PLASMA GLUCOSE: 229 mg/dL

## 2016-10-09 LAB — GLUCOSE, CAPILLARY
GLUCOSE-CAPILLARY: 134 mg/dL — AB (ref 65–99)
Glucose-Capillary: 282 mg/dL — ABNORMAL HIGH (ref 65–99)

## 2016-10-09 LAB — HIV ANTIBODY (ROUTINE TESTING W REFLEX): HIV SCREEN 4TH GENERATION: NONREACTIVE

## 2016-10-09 MED ORDER — OMEGA-3-ACID ETHYL ESTERS 1 G PO CAPS
1.0000 g | ORAL_CAPSULE | Freq: Every day | ORAL | Status: DC
  Administered 2016-10-09: 1 g via ORAL
  Filled 2016-10-09: qty 1

## 2016-10-09 MED ORDER — CEPHALEXIN 500 MG PO CAPS: 500.0000 mg | ORAL_CAPSULE | Freq: Two times a day (BID) | ORAL | 0 refills | Status: DC

## 2016-10-09 NOTE — Progress Notes (Signed)
Discharge instructions reviewed with patient and wife. Patient and wife stated understanding, IV removed. Wife is at bedside for transportation

## 2016-10-09 NOTE — H&P (Signed)
Internal Medicine Attending Admission Note  I saw and evaluated the patient. I reviewed the resident's note and I agree with the resident's findings and plan as documented in the resident's note.  Assessment & Plan by Problem:   Cellulitis of left lower extremity - DVT ruled out, no concern for PE.  Appearance consistent with uncomplicated cellulitis however venous stasis is in the differential.  Given overnight improvement suspect this is cellulitis.  Will change to oral Keflex and prescribe 7 day course.  Recommended to follow up with PCP in 1 week.  Call/come in if worsening.  If dose not improve reconsider venous stasis, reevaluate PAD and consider compression stockings if good arterial flow.    Uncontrolled Type II IDDM -reasonably well controlled here but not at goal. Will need ongoing management with PCP but can be safely discharged on home regimen.    HTN (hypertension) - Continue home medications   PVD, chronic LLE - He reports previous blockage to his left lower extremity but has collateral flow.  He is on appropriate medical therapy,  I have recommended that he follow up with his PCP and consider repeat ABI.  He denies any symptoms of claudication    Rheumatoid arthritis ([place]) - No evidence of synovitis    Hyperlipidemia -Atrovastatin 80mg  daily    COPD (chronic obstructive pulmonary disease) (HCC) No evidence of exacerbation    CAD S/P percutaneous coronary angioplasty  - No chest pain, continue home therapy    Thrombocytopenia ([place]) - Can follow up as outpatient with repeat CBC  Chief Complaint(s):LLE redness  History - key components related to admission:Briefly Robert [name] is a 66 yo male with history of poorly controlled DM, HTN, HLD, COPD, RA, CAD, and PVD who presents with 1 week of left lower leg swelling and redness.  There is also associated warmth but no fevers, chills, chest pain, SOB.  Family was concerned for DVT and wanted to be evaluated.  An U/S duplex  ruled out DVT and IMTS was called for admission.  There is no drainage from the site and he was started on IV ancef for cellulitis, he has already seen improvement in the redness and warmth.   Lab results: Reviewed in Epic  Physical Exam - key components related to admission: General: resting in bed HEENT: PERRL, EOMI, no scleral icterus Cardiac: RRR, no rubs, murmurs or gallops Pulm: clear to auscultation bilaterally, moving normal volumes of air Abd: soft, nontender, nondistended, BS present Ext: LLE: redness decreased 3cm back from marked line, there is redness and warmth, minimal difference in calf circumference. He has decreased hair growth on bilateral lower extremities and mild darkening of skin bilaterally consistent with hemosiderin depostion. Neuro: alert and oriented X3, cranial nerves II-XII grossly intact   Vitals:   10/08/16 1700 10/08/16 1726 10/08/16 2033 10/09/16 0446  BP: (!) 155/56  115/70 (!) 113/54  Pulse: 68  76 67  Resp: 18  18 18   Temp: 97.6 F (36.4 C)  98.2 F (36.8 C) 98.2 F (36.8 C)  TempSrc: Oral  Oral Oral  SpO2: 95% 96% 95% 92%  Weight:      Height:

## 2016-10-09 NOTE — Discharge Summary (Signed)
Name: Robert [name] MRN: [id number] DOB: 1951-08-08 66 y.o. PCP: Redmond School, MD  Date of Admission: 10/08/2016 12:19 PM Date of Discharge: 10/09/2016 Attending Physician: Lucious Groves, DO  Discharge Diagnosis: 1.   Cellulitis   Thrombocytopenia ([place])  Discharge Medications: Allergies as of 10/09/2016      Reactions   Ivp Dye [iodinated Diagnostic Agents] Itching, Rash   Plavix [clopidogrel Bisulfate] Rash      Medication List    TAKE these medications   ALPRAZolam 0.5 MG tablet Commonly known as:  XANAX Take 0.5 mg by mouth 3 (three) times daily as needed. For anxiety   aspirin 81 MG EC tablet Take 1 tablet (81 mg total) by mouth daily.   atorvastatin 80 MG tablet Commonly known as:  LIPITOR Take 1 tablet (80 mg total) by mouth daily at 6 PM.   cephALEXin 500 MG capsule Commonly known as:  KEFLEX Take 1 capsule (500 mg total) by mouth 2 (two) times daily. Stop taking 10/17/2016   fish oil-omega-3 fatty acids 1000 MG capsule Take 1 g by mouth daily.   folic acid 1 MG tablet Commonly known as:  FOLVITE Take 1 mg by mouth daily.   glipiZIDE 10 MG 24 hr tablet Commonly known as:  GLUCOTROL XL Take 10 mg by mouth 2 (two) times daily.   isosorbide mononitrate 120 MG 24 hr tablet Commonly known as:  IMDUR Take 120 mg by mouth daily.   LANTUS SOLOSTAR 100 UNIT/ML Solostar Pen Generic drug:  Insulin Glargine Inject 80 Units into the skin daily at 10 pm.   metFORMIN 1000 MG tablet Commonly known as:  GLUCOPHAGE Take 1 tablet (1,000 mg total) by mouth 2 (two) times daily with a meal.   metoprolol tartrate 25 MG tablet Commonly known as:  LOPRESSOR TAKE 1 TABLET (25 MG TOTAL) BY MOUTH 2 (TWO) TIMES DAILY. PLEASE CONTACT OFFICE FOR ADDITIONAL REFILLS   nitroGLYCERIN 0.4 MG SL tablet Commonly known as:  NITROSTAT Place 1 tablet (0.4 mg total) under the tongue every 5 (five) minutes x 3 doses as needed for chest pain.   primidone 50 MG tablet Commonly known  as:  MYSOLINE Take 50 mg by mouth daily.   quinapril 40 MG tablet Commonly known as:  ACCUPRIL Take 20 mg by mouth at bedtime. Patient takes with 10 mg for 30 mg dose   quinapril 40 MG tablet Commonly known as:  ACCUPRIL TAKE 1/2 TABLET AT BEDTIME   [name] HCl [phone] MG Tb24 Take 1 tablet by mouth daily.       Disposition and follow-up:   Mr.Robert [name] was discharged from [facility] in Stable condition.  At the hospital follow up visit please address:  1.  Cellulitis- Has infection improved?  Thrombocytopenia - Does this need further investigation?  2.  Labs / imaging needed at time of follow-up: CBC  3.  Pending labs/ test needing follow-up: none   Follow-up Appointments:   Hospital Course by problem list:    Non purulent Cellulitis left lower extremity    PVD, chronic LLE 66 y.o. male with a PMH of type two diabetes, rheumatoid arthritis, CAD (s/p 5 stents on ASA) who presents with one week left lower extremity swelling, redness, and warmth. He was found to be afebrile with an erythematous and warm left lower extremity and a healing wound over his left heal. WBC count 9.8. Lower extremity doppler was negative for DVT. He was started on IV ancef and area  of erythema had receded somewhat the day following admission. Transitioned to Keflex 500 mg BID for 6 days to complete a 7 day total course.     Thrombocytopenia (HCC) Plt count at time of admission 131 then decreased to 97 on the following day. No signs of bleeding. Will need to be followed outpatient.     Type II IDDM A1c 1/28 was 9.6. Was managed with moderate sliding scale and lantus 50 units qHS. Glucose trended 130-150.     HTN (hypertension) Remained normotensive on home medication quinapril 30 mg qd, imdur, and lopressor.     Rheumatoid arthritis ([place]) Managed with methotrexate and actemra, these may need to be held until infection resolves.   Discharge Vitals:   BP (!)  113/54 (BP Location: Right Arm)   Pulse 67   Temp 98.2 F (36.8 C) (Oral)   Resp 18   Ht 6' (1.829 m)   Wt 245 lb (111.1 kg)   SpO2 92%   BMI 33.23 kg/m    Discharge Instructions: Discharge Instructions    Call MD for:  difficulty breathing, headache or visual disturbances    Complete by:  As directed    Call MD for:  redness, tenderness, or signs of infection (pain, swelling, redness, odor or green/yellow discharge around incision site)    Complete by:  As directed    Call MD for:  temperature >100.4    Complete by:  As directed    Diet - low sodium heart healthy    Complete by:  As directed    Discharge instructions    Complete by:  As directed    Thank you for trusting [place] with your medical care!  You were hospitalized for skin and soft tissue infection of your left leg and treated with antibiotics.   Please take note of the following changes to your medications: START taking keflex twice daily until you complete the course on 10/15/2016   To make sure you are getting better, please make it to the follow-up appointments listed on the first page.  If you have any questions, please call [phone].   Increase activity slowly    Complete by:  As directed       Signed: Ledell Noss, MD 10/18/2016, 7:08 PM   Pager: [phone]

## 2016-10-09 NOTE — Care Management Obs Status (Signed)
[place] NOTIFICATION   Patient Details  Name: Robert [name] MRN: [id number] Date of Birth: 1951/06/22   Medicare Observation Status Notification Given:  Yes    Ninfa Meeker, RN 10/09/2016, 12:14 PM

## 2016-10-09 NOTE — Progress Notes (Signed)
Inpatient Diabetes Program Recommendations  AACE/ADA: New Consensus Statement on Inpatient Glycemic Control (2015)  Target Ranges:  Prepandial:   less than 140 mg/dL      Peak postprandial:   less than 180 mg/dL (1-2 hours)      Critically ill patients:  140 - 180 mg/dL     Spoke with pt about home DM regimen.  Patient stated he takes Lantus 80 units QHS + Glipizide + Janumet.  States Janumet is expensive but all other meds are affordable.    Spoke with patient about his current A1c of 9.6%.  Reminded patient that his goal A1c is 7% or less per ADA standards to prevent both acute and long-term complications.   Encouraged patient to check his CBGs at least bid at home (fasting and another check within the day) and to record all CBGs in a logbook for his PCP to review.    Patient plans to follow up with PCP Dr. Gerarda Fraction after d/c.  Did not have any DM related questions for me.  Stated he needs to keep working on his diet to improve CBGs.     --Will follow patient during hospitalization--  Wyn Quaker RN, MSN, CDE Diabetes Coordinator Inpatient Glycemic Control Team Team Pager: [phone] (8a-5p)

## 2016-10-09 NOTE — Progress Notes (Signed)
  Subjective: [name] feels as if the redness and warmth to touch over his left leg has improved today. He feels ready to go home on oral antibiotics.   Objective:  Vital signs in last 24 hours: Vitals:   10/08/16 1700 10/08/16 1726 10/08/16 2033 10/09/16 0446  BP: (!) 155/56  115/70 (!) 113/54  Pulse: 68  76 67  Resp: 18  18 18   Temp: 97.6 F (36.4 C)  98.2 F (36.8 C) 98.2 F (36.8 C)  TempSrc: Oral  Oral Oral  SpO2: 95% 96% 95% 92%  Weight:      Height:       Physical Exam  Constitutional: He is oriented to person, place, and time. He appears well-developed and well-nourished. No distress.  HENT:  Head: Normocephalic and atraumatic.  Cardiovascular: Normal rate and regular rhythm.   No murmur heard. Neurological: He is alert and oriented to person, place, and time.  Skin: Skin is warm and dry. He is not diaphoretic.  Erythema over left lower extremity receding from line marked over that area yesterday afternoon. Slight warmth to touch.      Assessment/Plan:    Cellulitis Improved after 4 doses of IV ancef. Will transition to PO keflex. Has remained afebrile overnight without leukocytosis.  -Keflex 500 mg BID for 7 day total course (6 days remaining at the time of discharge)   Thrombocytopenia  Plt 131 yesterday, dropped to 97 today. No signs/ symptoms of bleeding. Will need outpatient follow up.     Type II IDDM   Diabetes ([place]) Self reported A1c 10 1 month prior to admission has improved to 9.6. CBG 130-150 on moderate ISS and lantus 50 units qHS.  -Resume home medications including glipizide 10 mg BID, sitagliptin-metformin 100-1000mg  q24h, and lantus 50 units qHS  HTN (hypertension) Remains normotensive on home meds quinapril 30 mg qd imdur and lopressor for CAD     PVD, chronic LLE Describes peripheral vascular disease in his left lower extremity not amendable to stenting. This is being followed outpatient.     Rheumatoid arthritis ([place]) Managed  with methotrexate inj on thursdays and actemra (tocilizumab) inj on mondays. Therapy may need to be held until his infection resolves.     CAD S/P percutaneous coronary angioplasty   Hyperlipidemia Resume home med ASA 81 mg qd, atorvastatin 80 mg qd, imdur 120 mg qd, and lopressor 25 mg BID   Resting tremor  - ordered home med primidone 50 mg qd   Dispo: Anticipated discharge today  Ledell Noss, MD 10/09/2016, 10:56 AM Pager: [phone]

## 2016-10-13 DIAGNOSIS — E1165 Type 2 diabetes mellitus with hyperglycemia: Secondary | ICD-10-CM | POA: Diagnosis not present

## 2016-10-13 DIAGNOSIS — L03119 Cellulitis of unspecified part of limb: Secondary | ICD-10-CM | POA: Diagnosis not present

## 2016-10-13 DIAGNOSIS — Z6834 Body mass index (BMI) 34.0-34.9, adult: Secondary | ICD-10-CM | POA: Diagnosis not present

## 2016-10-13 DIAGNOSIS — I872 Venous insufficiency (chronic) (peripheral): Secondary | ICD-10-CM | POA: Diagnosis not present

## 2016-10-16 ENCOUNTER — Other Ambulatory Visit (HOSPITAL_COMMUNITY): Payer: Self-pay | Admitting: Internal Medicine

## 2016-10-16 DIAGNOSIS — R42 Dizziness and giddiness: Secondary | ICD-10-CM

## 2016-10-17 DIAGNOSIS — M0579 Rheumatoid arthritis with rheumatoid factor of multiple sites without organ or systems involvement: Secondary | ICD-10-CM | POA: Diagnosis not present

## 2016-10-19 ENCOUNTER — Ambulatory Visit (HOSPITAL_COMMUNITY): Payer: Medicare HMO

## 2016-10-19 ENCOUNTER — Encounter (HOSPITAL_COMMUNITY): Payer: Self-pay

## 2016-10-19 ENCOUNTER — Ambulatory Visit (HOSPITAL_COMMUNITY)
Admission: RE | Admit: 2016-10-19 | Discharge: 2016-10-19 | Disposition: A | Discharge status: Home / Self Care | Payer: Medicare HMO | Source: Ambulatory Visit | Attending: Internal Medicine | Admitting: Internal Medicine

## 2016-10-19 DIAGNOSIS — R42 Dizziness and giddiness: Secondary | ICD-10-CM | POA: Diagnosis not present

## 2016-10-19 DIAGNOSIS — I6523 Occlusion and stenosis of bilateral carotid arteries: Secondary | ICD-10-CM | POA: Insufficient documentation

## 2016-10-23 ENCOUNTER — Ambulatory Visit (INDEPENDENT_AMBULATORY_CARE_PROVIDER_SITE_OTHER): Payer: Medicare HMO | Admitting: Cardiovascular Disease

## 2016-10-23 ENCOUNTER — Encounter: Payer: Self-pay | Admitting: Cardiovascular Disease

## 2016-10-23 VITALS — BP 160/91 | HR 84 | Ht 72.0 in | Wt 256.0 lb

## 2016-10-23 DIAGNOSIS — E1151 Type 2 diabetes mellitus with diabetic peripheral angiopathy without gangrene: Secondary | ICD-10-CM

## 2016-10-23 DIAGNOSIS — I10 Essential (primary) hypertension: Secondary | ICD-10-CM | POA: Diagnosis not present

## 2016-10-23 DIAGNOSIS — E782 Mixed hyperlipidemia: Secondary | ICD-10-CM

## 2016-10-23 DIAGNOSIS — Z794 Long term (current) use of insulin: Secondary | ICD-10-CM

## 2016-10-23 DIAGNOSIS — I25118 Atherosclerotic heart disease of native coronary artery with other forms of angina pectoris: Secondary | ICD-10-CM

## 2016-10-23 DIAGNOSIS — I739 Peripheral vascular disease, unspecified: Secondary | ICD-10-CM | POA: Diagnosis not present

## 2016-10-23 DIAGNOSIS — J449 Chronic obstructive pulmonary disease, unspecified: Secondary | ICD-10-CM

## 2016-10-23 DIAGNOSIS — I5042 Chronic combined systolic (congestive) and diastolic (congestive) heart failure: Secondary | ICD-10-CM

## 2016-10-23 NOTE — Progress Notes (Signed)
Cardiology Office Note    Date:  10/23/2016   ID:  Robert [name], DOB 1951-05-22, MRN [id number]  PCP:  Glo Herring., MD  Cardiologist:   Sanda Klein, MD   Chief complaint: CAD   History of Present Illness:  Robert [name] is a 66 y.o. male with premature onset coronary artery disease and frequent problems with in-stent restenosis after multiple interventions in the left circumflex coronary artery. His last Percutaneous intervention was in May 2016, now roughly 20 months ago, the longest period by far that he has gone without restenosis. The difference may be the fact that he has completely quit smoking for the first time in decades. He also has known chronic total occlusion of the right coronary artery, PAD, insulin requiring diabetes mellitus, dyslipidemia with very low HDL and rheumatoid arthritis. He is enrolled in the Dal-Gene study (dalcetrapib vs. Placebo).  In late January, he had an episode of cellulitis of the left pretibial area, likely related to a scratch when he went hunting. He has just completed treatment with antibiotics and all the erythema has gone, although he still has a little residual swelling in that limb. He has been started on low-dose diuretics by Dr. Gerarda Fraction. He is free of angina pectoris. He denies exertional dyspnea with usual activity, but does have shortness of breath if he has to walk fast or walks uphill. He denies leg edema. He has rare wheezing and cough, but does not have sputum production or hemoptysis. He has significant neuropathy in both lower extremities. He has had some issues with worsening balance. Dr. Gerarda Fraction sent him for carotid duplex ultrasound which was normal and is planning for him to have a CT of the head, scheduled for tomorrow.  He is still taking Actemra for rheumatoid arthritis and is now also taking methotrexate intravenously. He has gained almost 20 pounds since quitting smoking. He successfully underwent surgical repair of all 3  abdominal hernias with Dr. Ninfa Linden last September.  Esa and his wife both quit smoking simultaneously. His wife restarted smoking for a few weeks after her father passed away, but has managed to quit again. Thankfully he has not restarted smoking although he still has cravings for cigarettes.   summary of coronary interventions: -cath 2004 showed total occlusion of RCA and intermediate lesions LAD, high grade mid LCX treated with 4x18 mm BMS (Velocity).  - 2006 overlapping 3.5x23 mm Cypher inLCX  - 2007 new downstream stenosis at OM bifurcation treated with 3x18 mm Cypher stent  - 2008 acute stent thrombosis (allergic to plavix, temporarily stopped Ticlid) - thrombolysis/aspiration only  - April 2013 cutting balloon angioplasty for proximal LCX in stent restenosis (Dr. Claiborne Billings)  - September 2013 repeat restenosis treated with "stent in stent" Xience Expedition 3.25 x 23 mm (Dr. Gwenlyn Found).  - March 2014 left circumflex coronary artery PCI for restenosis, utilizing cutting balloon atherotomy/noncompliant balloon dilatation  - December 2014  Thrombotic occlusion of restenotic proximal left circumflex stent , new Promus Premier DES 3.5 mm x 24 mm; overlapping the proximal and distal stents -  May 2016 cutting balloon angioplasty for in-stent restenosis in the proximal left circumflex coronary artery -  July 2016 non STEMI ,  coronary angiogram unchanged,no intervention performed -  No change in RCA or LAD anatomy during last several years.  -  Allergic to iodinated contrast, but did well with premedication. Allergic to clopidogrel but tolerates Brilinta and Effient well.   Past Medical History:  Diagnosis Date  . Anxiety   .  COPD (chronic obstructive pulmonary disease) ([place])   . Coronary artery disease   . Full dentures   . GERD (gastroesophageal reflux disease)    Rolaids as needed  . High cholesterol   . History of MI (myocardial infarction)   . Hypertension    med. dosage increased  04/2016; has been on BP med. x 10 yrs.  . Incarcerated umbilical hernia 0000000  . Inguinal hernia 05/2016   bilateral   . Insulin dependent diabetes mellitus ([place])   . Ischemic cardiomyopathy    a. 03/2015 EF 40-45% by LV gram.  . Rheumatoid arthritis(714.0)     Past Surgical History:  Procedure Laterality Date  . CARDIAC CATHETERIZATION N/A 02/01/2015   Procedure: Left Heart Cath and Coronary Angiography;  Surgeon: Belva Crome, MD;  Location: [place] [facility];  Service: Cardiovascular;  Laterality: N/A;  . CARDIAC CATHETERIZATION N/A 03/22/2015   Procedure: Left Heart Cath and Coronary Angiography;  Surgeon: Leonie Man, MD;  Location: [place] [facility];  Service: Cardiovascular;  Laterality: N/A;  . CARDIAC CATHETERIZATION  09/05/2002; 03/09/2003; 04/10/2005;06/02/2005; 05/09/2006; 02/09/2007; 03/01/2007  . CORONARY ANGIOPLASTY  11/14/2012; 02/01/2015  . CORONARY ANGIOPLASTY WITH STENT PLACEMENT  05/16/2012   "1; makes total ~ 4"  . INSERTION OF MESH Bilateral 05/24/2016   Procedure: INSERTION OF MESH;  Surgeon: Coralie Keens, MD;  Location: [place];  Service: General;  Laterality: Bilateral;  . LAPAROSCOPIC CHOLECYSTECTOMY    . LAPAROSCOPIC INGUINAL HERNIA WITH UMBILICAL HERNIA Bilateral 05/24/2016   Procedure: BILATERAL LAPAROSCOPIC INGUINAL HERNIA REPAIR WITH MESH AND UMBILICAL HERNIA REPAIR WITH MESH;  Surgeon: Coralie Keens, MD;  Location: [place];  Service: General;  Laterality: Bilateral;  . LEFT HEART CATHETERIZATION WITH CORONARY ANGIOGRAM N/A 12/22/2011   Procedure: LEFT HEART CATHETERIZATION WITH CORONARY ANGIOGRAM;  Surgeon: Troy Sine, MD;  Location[facility] CATH LAB;  Service: Cardiovascular;  Laterality: N/A;  . LEFT HEART CATHETERIZATION WITH CORONARY ANGIOGRAM Bilateral 05/16/2012   Procedure: LEFT HEART CATHETERIZATION WITH CORONARY ANGIOGRAM;  Surgeon: Lorretta Harp, MD;  Location: [facility] CATH LAB;  Service: Cardiovascular;  Laterality:  Bilateral;  . LEFT HEART CATHETERIZATION WITH CORONARY ANGIOGRAM N/A 11/14/2012   Procedure: LEFT HEART CATHETERIZATION WITH CORONARY ANGIOGRAM;  Surgeon: Troy Sine, MD;  Location: [facility] CATH LAB;  Service: Cardiovascular;  Laterality: N/A;  . LEFT HEART CATHETERIZATION WITH CORONARY ANGIOGRAM N/A 09/01/2013   Procedure: LEFT HEART CATHETERIZATION WITH CORONARY ANGIOGRAM;  Surgeon: Leonie Man, MD;  Location: [facility] CATH LAB;  Service: Cardiovascular;  Laterality: N/A;  . [place]; 1973; 1985   x 3  . NM MYOCAR PERF WALL MOTION  08/30/2009   No significant ischemia  . PERCUTANEOUS CORONARY INTERVENTION-BALLOON ONLY  12/22/2011   Procedure: PERCUTANEOUS CORONARY INTERVENTION-BALLOON ONLY;  Surgeon: Troy Sine, MD;  Location: [facility] CATH LAB;  Service: Cardiovascular;;  . PERCUTANEOUS CORONARY INTERVENTION-BALLOON ONLY  11/14/2012   Procedure: PERCUTANEOUS CORONARY INTERVENTION-BALLOON ONLY;  Surgeon: Troy Sine, MD;  Location: [facility] CATH LAB;  Service: Cardiovascular;;  . PERCUTANEOUS CORONARY STENT INTERVENTION (PCI-S)  05/16/2012   Procedure: PERCUTANEOUS CORONARY STENT INTERVENTION (PCI-S);  Surgeon: Lorretta Harp, MD;  Location: [facility] CATH LAB;  Service: Cardiovascular;;  . PERCUTANEOUS CORONARY STENT INTERVENTION (PCI-S)  09/01/2013   Procedure: PERCUTANEOUS CORONARY STENT INTERVENTION (PCI-S);  Surgeon: Leonie Man, MD;  Location: [facility] CATH LAB;  Service: Cardiovascular;;    Current Medications: Outpatient Medications Prior to Visit  Medication Sig Dispense Refill  .  ALPRAZolam (XANAX) 0.5 MG tablet Take 0.5 mg by mouth 3 (three) times daily as needed. For anxiety    . aspirin EC 81 MG EC tablet Take 1 tablet (81 mg total) by mouth daily.    [name] atorvastatin (LIPITOR) 80 MG tablet Take 1 tablet (80 mg total) by mouth daily at 6 PM. 90 tablet 3  . cephALEXin (KEFLEX) 500 MG capsule Take 1 capsule (500 mg total) by mouth 2 (two) times daily. Stop taking 10/17/2016 12 capsule 0    . fish oil-omega-3 fatty acids 1000 MG capsule Take 1 g by mouth daily.     . folic acid (FOLVITE) 1 MG tablet Take 1 mg by mouth daily.     [name] glipiZIDE (GLUCOTROL XL) 10 MG 24 hr tablet Take 10 mg by mouth 2 (two) times daily.    . Insulin Glargine (LANTUS SOLOSTAR) 100 UNIT/ML Solostar Pen Inject 80 Units into the skin daily at 10 pm.    . isosorbide mononitrate (IMDUR) 120 MG 24 hr tablet Take 120 mg by mouth daily.    . metFORMIN (GLUCOPHAGE) 1000 MG tablet Take 1 tablet (1,000 mg total) by mouth 2 (two) times daily with a meal.    . metoprolol tartrate (LOPRESSOR) 25 MG tablet TAKE 1 TABLET (25 MG TOTAL) BY MOUTH 2 (TWO) TIMES DAILY. PLEASE CONTACT OFFICE FOR ADDITIONAL REFILLS 180 tablet 3  . nitroGLYCERIN (NITROSTAT) 0.4 MG SL tablet Place 1 tablet (0.4 mg total) under the tongue every 5 (five) minutes x 3 doses as needed for chest pain. 25 tablet 2  . primidone (MYSOLINE) 50 MG tablet Take 50 mg by mouth daily.    . quinapril (ACCUPRIL) 40 MG tablet Take 20 mg by mouth at bedtime. Patient takes with 10 mg for 30 mg dose    . quinapril (ACCUPRIL) 40 MG tablet TAKE 1/2 TABLET AT BEDTIME (Patient not taking: Reported on 10/23/2016) 45 tablet 3  . [name] HCl [phone] MG TB24 Take 1 tablet by mouth daily.     No facility-administered medications prior to visit.      Allergies:   Ivp dye [iodinated diagnostic agents] and Plavix [clopidogrel bisulfate]   Social History   Social History  . Marital status: Married    Spouse name: N/A  . Number of children: N/A  . Years of education: N/A   Occupational History  . tractor Recruitment consultant    Social History Main Topics  . Smoking status: Former Smoker    Packs/day: 0.75    Quit date: 06/11/2015  . Smokeless tobacco: Never Used  . Alcohol use No  . Drug use: No  . Sexual activity: Yes   Other Topics Concern  . None   Social History Narrative  . None     Family History:  The patient's family history includes  Cancer in his mother; Colon cancer in his father; Heart disease in his mother; Hypertension in his mother.   ROS:   Please see the history of present illness.    ROS All other systems reviewed and are negative.   PHYSICAL EXAM:   VS:  BP (!) 160/91 (BP Location: Right Arm, Patient Position: Sitting, Cuff Size: Normal)   Pulse 84   Ht 6' (1.829 m)   Wt 116.1 kg (256 lb)   BMI 34.72 kg/m     BP rechecked 146/81  GEN: Well nourished, well developed, in no acute distress  HEENT: normal  Neck: no JVD, carotid bruits, or masses Cardiac: RRR; no murmurs,  rubs, or gallops,no edema  Respiratory:  Initial breath sounds throughout, otherwise clear to auscultation bilaterally, normal work of breathing GI: soft, nontender, nondistended, + BS, large periumbilical hernia and bilateral inguinal hernias MS: no deformity or atrophy  Skin: warm and dry, no rash Neuro:  Alert and Oriented x 3, Strength and sensation are intact Psych: euthymic mood, full affect  Wt Readings from Last 3 Encounters:  10/23/16 116.1 kg (256 lb)  10/08/16 111.1 kg (245 lb)  05/24/16 111.6 kg (246 lb)      Studies/Labs Reviewed:   EKG:  EKG is ordered today.  The ekg ordered today demonstrates Sinus rhythm with a Couple of PVCs, old inferior wall MI, no acute repolarization changes, QTC 435 ms  Recent Labs: 10/08/2016: ALT 42 10/09/2016: BUN 8; Creatinine, Ser 0.97; Hemoglobin 14.8; Platelets 97; Potassium 3.5; Sodium 136   Lipid Panel    Component Value Date/Time   CHOL 138 09/02/2013 0223   TRIG 322 (H) 09/02/2013 0223   HDL 29 (L) 09/02/2013 0223   CHOLHDL 4.8 09/02/2013 0223   VLDL 64 (H) 09/02/2013 0223   LDLCALC 45 09/02/2013 0223   April 2016 total cholesterol 121, HDL 23, LDL 28, triglycerides 595. Hemoglobin A1c 9.6% on 10/08/2016, creatinine 0.97  ASSESSMENT:    1. Chronic combined systolic and diastolic CHF (congestive heart failure) (HCC)      PLAN:  In order of problems listed  above:  1. CAD: This is the longest period that Mr. Arrizon has ever had without developing in-stent restenosis, and hopefully the fact that he has quit smoking will lead to reduce progression of disease. 20 months after his last intervention, it would be quite unlikely for him to develop restenosis in that same distribution. I think he can try to wean down the dose of isosorbide mononitrate from 120 to 60 mg daily. 2. CHF: He has mildly depressed left ventricular systolic function. Weight gain noted, but can probably be attributed to smoking cessation. Recently started on low dose loop diuretic by PCP. Swelling may be related to recent infection since it is unilateral. Note normal venous ultrasound. No other peripheral signs of hypervolemia. I don't think diuretics are indicated. Reinforce sodium restriction. 3. PAD: He has known chronic occlusion of the left popliteal artery with collateral formation and three-vessel runoff. This was evaluated by one of our vascular specialist and felt to be best suited for medical therapy. He does not appear to describe intermittent claudication. He does have diabetes and the importance of careful monitoring for complications was reinforced. Congratulated on smoking cessation. Recently performed bilateral carotid artery duplex ultrasound was normal.. 4. HTN: Blood pressure was elevated when he first checked in, almost normal when rechecked 10 minutes later. Blood pressure was 113/54 in late January No changes to medications. 5. COPD: I suspect this is the most important contributor to his exertional dyspnea, cannot exclude contribution of heart failure. Seems to be a stable abnormality. No clinical findings to suggest hypervolemia. Not wheezing today. 6. HLP: LDL cholesterol is well within target range when last checked, more than a year ago. Need to get updated values.[name] HDL remains very low and is unlikely to improve without increased physical activity and substantial  weight loss. Elevated triglycerides are related to poorly controlled diabetes. Due to have a repeat lipid profile in March with PCP. Strongly recommended aggressive attempts to prevent additional weight gain. 7. DM: Complicated by neuropathy and peripheral angiopathy, not well controlled. This puts him at risk for progression of  coronary and peripheral artery disease.   Medication Adjustments/Labs and Tests Ordered: Current medicines are reviewed at length with the patient today.  Concerns regarding medicines are outlined above.  Medication changes, Labs and Tests ordered today are listed in the Patient Instructions below. Patient Instructions  Dr Sallyanne Kuster recommends that you schedule a follow-up appointment in 12 months. You will receive a reminder letter in the mail two months in advance. If you don't receive a letter, please call our office to schedule the follow-up appointment.  If you need a refill on your cardiac medications before your next appointment, please call your pharmacy.    Signed, Sanda Klein, MD  10/23/2016 5:38 PM    [address], [place], [place]  [address] Phone: [phone]; Fax: [phone]

## 2016-10-23 NOTE — Patient Instructions (Signed)
Dr Croitoru recommends that you schedule a follow-up appointment in 12 months. You will receive a reminder letter in the mail two months in advance. If you don't receive a letter, please call our office to schedule the follow-up appointment.  If you need a refill on your cardiac medications before your next appointment, please call your pharmacy. 

## 2016-10-26 ENCOUNTER — Ambulatory Visit (HOSPITAL_COMMUNITY): Payer: Medicare HMO

## 2016-11-16 ENCOUNTER — Encounter: Payer: Self-pay | Admitting: *Deleted

## 2016-11-16 DIAGNOSIS — Z006 Encounter for examination for normal comparison and control in clinical research program: Secondary | ICD-10-CM

## 2016-11-16 NOTE — Progress Notes (Signed)
Dal-Gene Study  I saw patient for 66-month study visit. Patient is doing well. Patient is compliant with study medication. I dispensed new study medications to patient and I will see him back in 6 months.

## 2016-11-17 ENCOUNTER — Other Ambulatory Visit: Payer: Self-pay | Admitting: *Deleted

## 2016-11-17 MED ORDER — AMBULATORY NON FORMULARY MEDICATION: 600.0000 mg | Freq: Once | Status: AC

## 2016-11-20 DIAGNOSIS — E669 Obesity, unspecified: Secondary | ICD-10-CM | POA: Diagnosis not present

## 2016-11-20 DIAGNOSIS — Z0001 Encounter for general adult medical examination with abnormal findings: Secondary | ICD-10-CM | POA: Diagnosis not present

## 2016-11-20 DIAGNOSIS — M069 Rheumatoid arthritis, unspecified: Secondary | ICD-10-CM | POA: Diagnosis not present

## 2016-11-20 DIAGNOSIS — E119 Type 2 diabetes mellitus without complications: Secondary | ICD-10-CM | POA: Diagnosis not present

## 2016-11-20 DIAGNOSIS — E6609 Other obesity due to excess calories: Secondary | ICD-10-CM | POA: Diagnosis not present

## 2016-11-20 DIAGNOSIS — E782 Mixed hyperlipidemia: Secondary | ICD-10-CM | POA: Diagnosis not present

## 2016-11-20 DIAGNOSIS — E1165 Type 2 diabetes mellitus with hyperglycemia: Secondary | ICD-10-CM | POA: Diagnosis not present

## 2016-11-20 DIAGNOSIS — Z6834 Body mass index (BMI) 34.0-34.9, adult: Secondary | ICD-10-CM | POA: Diagnosis not present

## 2016-11-20 DIAGNOSIS — Z1389 Encounter for screening for other disorder: Secondary | ICD-10-CM | POA: Diagnosis not present

## 2016-12-09 ENCOUNTER — Inpatient Hospital Stay (HOSPITAL_COMMUNITY)
Admission: EM | Admit: 2016-12-09 | Discharge: 2016-12-13 | DRG: 501 | Disposition: A | Discharge status: Home Health | Payer: Medicare HMO | Attending: General Surgery | Admitting: General Surgery

## 2016-12-09 ENCOUNTER — Emergency Department (HOSPITAL_COMMUNITY): Payer: Medicare HMO

## 2016-12-09 ENCOUNTER — Inpatient Hospital Stay (HOSPITAL_COMMUNITY): Payer: Medicare HMO

## 2016-12-09 ENCOUNTER — Encounter (HOSPITAL_COMMUNITY): Payer: Self-pay | Admitting: Cardiology

## 2016-12-09 DIAGNOSIS — M546 Pain in thoracic spine: Secondary | ICD-10-CM | POA: Diagnosis not present

## 2016-12-09 DIAGNOSIS — I25118 Atherosclerotic heart disease of native coronary artery with other forms of angina pectoris: Secondary | ICD-10-CM | POA: Diagnosis not present

## 2016-12-09 DIAGNOSIS — I252 Old myocardial infarction: Secondary | ICD-10-CM | POA: Diagnosis not present

## 2016-12-09 DIAGNOSIS — Z794 Long term (current) use of insulin: Secondary | ICD-10-CM | POA: Diagnosis not present

## 2016-12-09 DIAGNOSIS — I5042 Chronic combined systolic (congestive) and diastolic (congestive) heart failure: Secondary | ICD-10-CM | POA: Diagnosis not present

## 2016-12-09 DIAGNOSIS — E7800 Pure hypercholesterolemia, unspecified: Secondary | ICD-10-CM | POA: Diagnosis not present

## 2016-12-09 DIAGNOSIS — I739 Peripheral vascular disease, unspecified: Secondary | ICD-10-CM

## 2016-12-09 DIAGNOSIS — M25531 Pain in right wrist: Secondary | ICD-10-CM | POA: Diagnosis not present

## 2016-12-09 DIAGNOSIS — S52501A Unspecified fracture of the lower end of right radius, initial encounter for closed fracture: Secondary | ICD-10-CM

## 2016-12-09 DIAGNOSIS — S22039A Unspecified fracture of third thoracic vertebra, initial encounter for closed fracture: Secondary | ICD-10-CM | POA: Diagnosis not present

## 2016-12-09 DIAGNOSIS — Z79899 Other long term (current) drug therapy: Secondary | ICD-10-CM | POA: Diagnosis not present

## 2016-12-09 DIAGNOSIS — S020XXA Fracture of vault of skull, initial encounter for closed fracture: Secondary | ICD-10-CM | POA: Diagnosis not present

## 2016-12-09 DIAGNOSIS — K219 Gastro-esophageal reflux disease without esophagitis: Secondary | ICD-10-CM | POA: Diagnosis present

## 2016-12-09 DIAGNOSIS — S5291XA Unspecified fracture of right forearm, initial encounter for closed fracture: Secondary | ICD-10-CM

## 2016-12-09 DIAGNOSIS — Z8249 Family history of ischemic heart disease and other diseases of the circulatory system: Secondary | ICD-10-CM

## 2016-12-09 DIAGNOSIS — S80211A Abrasion, right knee, initial encounter: Secondary | ICD-10-CM | POA: Diagnosis present

## 2016-12-09 DIAGNOSIS — Z01810 Encounter for preprocedural cardiovascular examination: Secondary | ICD-10-CM

## 2016-12-09 DIAGNOSIS — R079 Chest pain, unspecified: Secondary | ICD-10-CM | POA: Diagnosis not present

## 2016-12-09 DIAGNOSIS — Y92009 Unspecified place in unspecified non-institutional (private) residence as the place of occurrence of the external cause: Secondary | ICD-10-CM

## 2016-12-09 DIAGNOSIS — E1142 Type 2 diabetes mellitus with diabetic polyneuropathy: Secondary | ICD-10-CM | POA: Diagnosis present

## 2016-12-09 DIAGNOSIS — I2510 Atherosclerotic heart disease of native coronary artery without angina pectoris: Secondary | ICD-10-CM | POA: Diagnosis present

## 2016-12-09 DIAGNOSIS — S2091XA Abrasion of unspecified parts of thorax, initial encounter: Secondary | ICD-10-CM | POA: Diagnosis not present

## 2016-12-09 DIAGNOSIS — J449 Chronic obstructive pulmonary disease, unspecified: Secondary | ICD-10-CM

## 2016-12-09 DIAGNOSIS — F17210 Nicotine dependence, cigarettes, uncomplicated: Secondary | ICD-10-CM | POA: Diagnosis present

## 2016-12-09 DIAGNOSIS — E1151 Type 2 diabetes mellitus with diabetic peripheral angiopathy without gangrene: Secondary | ICD-10-CM | POA: Diagnosis present

## 2016-12-09 DIAGNOSIS — W19XXXA Unspecified fall, initial encounter: Secondary | ICD-10-CM | POA: Diagnosis present

## 2016-12-09 DIAGNOSIS — S52571A Other intraarticular fracture of lower end of right radius, initial encounter for closed fracture: Secondary | ICD-10-CM | POA: Diagnosis not present

## 2016-12-09 DIAGNOSIS — M069 Rheumatoid arthritis, unspecified: Secondary | ICD-10-CM | POA: Diagnosis not present

## 2016-12-09 DIAGNOSIS — Z6834 Body mass index (BMI) 34.0-34.9, adult: Secondary | ICD-10-CM | POA: Diagnosis not present

## 2016-12-09 DIAGNOSIS — Z7982 Long term (current) use of aspirin: Secondary | ICD-10-CM

## 2016-12-09 DIAGNOSIS — I255 Ischemic cardiomyopathy: Secondary | ICD-10-CM | POA: Diagnosis present

## 2016-12-09 DIAGNOSIS — S52502A Unspecified fracture of the lower end of left radius, initial encounter for closed fracture: Secondary | ICD-10-CM | POA: Diagnosis not present

## 2016-12-09 DIAGNOSIS — S199XXA Unspecified injury of neck, initial encounter: Secondary | ICD-10-CM | POA: Diagnosis not present

## 2016-12-09 DIAGNOSIS — R9431 Abnormal electrocardiogram [ECG] [EKG]: Secondary | ICD-10-CM | POA: Diagnosis not present

## 2016-12-09 DIAGNOSIS — Z955 Presence of coronary angioplasty implant and graft: Secondary | ICD-10-CM | POA: Diagnosis not present

## 2016-12-09 DIAGNOSIS — Z800 Family history of malignant neoplasm of digestive organs: Secondary | ICD-10-CM

## 2016-12-09 DIAGNOSIS — I10 Essential (primary) hypertension: Secondary | ICD-10-CM | POA: Diagnosis not present

## 2016-12-09 DIAGNOSIS — I110 Hypertensive heart disease with heart failure: Secondary | ICD-10-CM | POA: Diagnosis present

## 2016-12-09 DIAGNOSIS — W108XXA Fall (on) (from) other stairs and steps, initial encounter: Secondary | ICD-10-CM | POA: Diagnosis not present

## 2016-12-09 DIAGNOSIS — T148XXA Other injury of unspecified body region, initial encounter: Secondary | ICD-10-CM | POA: Diagnosis not present

## 2016-12-09 DIAGNOSIS — S22038A Other fracture of third thoracic vertebra, initial encounter for closed fracture: Secondary | ICD-10-CM

## 2016-12-09 DIAGNOSIS — S80212A Abrasion, left knee, initial encounter: Secondary | ICD-10-CM | POA: Diagnosis not present

## 2016-12-09 DIAGNOSIS — M542 Cervicalgia: Secondary | ICD-10-CM | POA: Diagnosis not present

## 2016-12-09 DIAGNOSIS — S0291XA Unspecified fracture of skull, initial encounter for closed fracture: Secondary | ICD-10-CM | POA: Diagnosis not present

## 2016-12-09 DIAGNOSIS — I509 Heart failure, unspecified: Secondary | ICD-10-CM | POA: Diagnosis not present

## 2016-12-09 DIAGNOSIS — G8918 Other acute postprocedural pain: Secondary | ICD-10-CM | POA: Diagnosis not present

## 2016-12-09 DIAGNOSIS — R51 Headache: Secondary | ICD-10-CM | POA: Diagnosis not present

## 2016-12-09 DIAGNOSIS — E119 Type 2 diabetes mellitus without complications: Secondary | ICD-10-CM

## 2016-12-09 DIAGNOSIS — Y92018 Other place in single-family (private) house as the place of occurrence of the external cause: Secondary | ICD-10-CM

## 2016-12-09 DIAGNOSIS — T07XXXA Unspecified multiple injuries, initial encounter: Secondary | ICD-10-CM | POA: Diagnosis not present

## 2016-12-09 DIAGNOSIS — S299XXA Unspecified injury of thorax, initial encounter: Secondary | ICD-10-CM | POA: Diagnosis not present

## 2016-12-09 LAB — CBC WITH DIFFERENTIAL/PLATELET
Basophils Absolute: 0 10*3/uL (ref 0.0–0.1)
Basophils Relative: 0 %
EOS PCT: 0 %
Eosinophils Absolute: 0 10*3/uL (ref 0.0–0.7)
HEMATOCRIT: 50.5 % (ref 39.0–52.0)
Hemoglobin: 17.6 g/dL — ABNORMAL HIGH (ref 13.0–17.0)
LYMPHS ABS: 1.1 10*3/uL (ref 0.7–4.0)
LYMPHS PCT: 9 %
MCH: 33.8 pg (ref 26.0–34.0)
MCHC: 34.9 g/dL (ref 30.0–36.0)
MCV: 97.1 fL (ref 78.0–100.0)
MONO ABS: 0.8 10*3/uL (ref 0.1–1.0)
Monocytes Relative: 6 %
NEUTROS ABS: 10.4 10*3/uL — AB (ref 1.7–7.7)
Neutrophils Relative %: 85 %
PLATELETS: 93 10*3/uL — AB (ref 150–400)
RBC: 5.2 MIL/uL (ref 4.22–5.81)
RDW: 14.9 % (ref 11.5–15.5)
WBC: 12.3 10*3/uL — AB (ref 4.0–10.5)

## 2016-12-09 LAB — COMPREHENSIVE METABOLIC PANEL
ALK PHOS: 71 U/L (ref 38–126)
ALT: 38 U/L (ref 17–63)
AST: 35 U/L (ref 15–41)
Albumin: 4 g/dL (ref 3.5–5.0)
Anion gap: 10 (ref 5–15)
BILIRUBIN TOTAL: 0.7 mg/dL (ref 0.3–1.2)
BUN: 15 mg/dL (ref 6–20)
CALCIUM: 9.2 mg/dL (ref 8.9–10.3)
CHLORIDE: 102 mmol/L (ref 101–111)
CO2: 23 mmol/L (ref 22–32)
Creatinine, Ser: 0.84 mg/dL (ref 0.61–1.24)
GFR calc non Af Amer: >60 mL/min (ref >60)
Glucose, Bld: 278 mg/dL — ABNORMAL HIGH (ref 65–99)
Potassium: 4.4 mmol/L (ref 3.5–5.1)
Sodium: 135 mmol/L (ref 135–145)
Total Protein: 6.7 g/dL (ref 6.5–8.1)

## 2016-12-09 LAB — GLUCOSE, CAPILLARY
GLUCOSE-CAPILLARY: 215 mg/dL — AB (ref 65–99)
Glucose-Capillary: 171 mg/dL — ABNORMAL HIGH (ref 65–99)

## 2016-12-09 MED ORDER — CEFAZOLIN SODIUM-DEXTROSE 2-4 GM/100ML-% IV SOLN
2.0000 g | INTRAVENOUS | Status: DC
  Filled 2016-12-09: qty 100

## 2016-12-09 MED ORDER — ORAL CARE MOUTH RINSE
15.0000 mL | Freq: Two times a day (BID) | OROMUCOSAL | Status: DC
  Administered 2016-12-09 – 2016-12-13 (×6): 15 mL via OROMUCOSAL

## 2016-12-09 MED ORDER — PANTOPRAZOLE SODIUM 40 MG IV SOLR: 40.0000 mg | Freq: Every day | INTRAVENOUS | Status: DC

## 2016-12-09 MED ORDER — DOCUSATE SODIUM 100 MG PO CAPS
100.0000 mg | ORAL_CAPSULE | Freq: Two times a day (BID) | ORAL | Status: DC
  Administered 2016-12-09 – 2016-12-11 (×3): 100 mg via ORAL
  Filled 2016-12-09 (×3): qty 1

## 2016-12-09 MED ORDER — OXYCODONE HCL 5 MG PO TABS
10.0000 mg | ORAL_TABLET | ORAL | Status: DC | PRN
  Administered 2016-12-09 – 2016-12-13 (×10): 10 mg via ORAL
  Filled 2016-12-09 (×11): qty 2

## 2016-12-09 MED ORDER — ISOSORBIDE MONONITRATE ER 60 MG PO TB24: 120.0000 mg | ORAL_TABLET | Freq: Every day | ORAL | Status: DC

## 2016-12-09 MED ORDER — POTASSIUM CHLORIDE IN NACL 20-0.9 MEQ/L-% IV SOLN
INTRAVENOUS | Status: DC
  Administered 2016-12-09 – 2016-12-11 (×3): via INTRAVENOUS
  Filled 2016-12-09 (×3): qty 1000

## 2016-12-09 MED ORDER — CHLORHEXIDINE GLUCONATE 4 % EX LIQD
60.0000 mL | Freq: Once | CUTANEOUS | Status: AC
  Administered 2016-12-10: 4 via TOPICAL

## 2016-12-09 MED ORDER — OXYCODONE HCL 5 MG PO TABS
5.0000 mg | ORAL_TABLET | ORAL | Status: DC | PRN
  Administered 2016-12-12 – 2016-12-13 (×3): 5 mg via ORAL
  Filled 2016-12-09 (×3): qty 1

## 2016-12-09 MED ORDER — QUINAPRIL HCL 10 MG PO TABS: 20.0000 mg | ORAL_TABLET | Freq: Every day | ORAL | Status: DC

## 2016-12-09 MED ORDER — METOPROLOL TARTRATE 25 MG PO TABS
25.0000 mg | ORAL_TABLET | Freq: Two times a day (BID) | ORAL | Status: DC
  Administered 2016-12-09 – 2016-12-13 (×7): 25 mg via ORAL
  Filled 2016-12-09 (×7): qty 1

## 2016-12-09 MED ORDER — MORPHINE SULFATE (PF) 4 MG/ML IV SOLN
4.0000 mg | Freq: Once | INTRAVENOUS | Status: AC
  Administered 2016-12-09: 4 mg via INTRAVENOUS
  Filled 2016-12-09: qty 1

## 2016-12-09 MED ORDER — PANTOPRAZOLE SODIUM 40 MG PO TBEC
40.0000 mg | DELAYED_RELEASE_TABLET | Freq: Every day | ORAL | Status: DC
  Administered 2016-12-09 – 2016-12-13 (×4): 40 mg via ORAL
  Filled 2016-12-09 (×4): qty 1

## 2016-12-09 MED ORDER — ONDANSETRON HCL 4 MG PO TABS: 4.0000 mg | ORAL_TABLET | Freq: Four times a day (QID) | ORAL | Status: DC | PRN

## 2016-12-09 MED ORDER — PROCHLORPERAZINE EDISYLATE 5 MG/ML IJ SOLN
5.0000 mg | Freq: Once | INTRAMUSCULAR | Status: AC
  Administered 2016-12-09: 5 mg via INTRAVENOUS
  Filled 2016-12-09: qty 2

## 2016-12-09 MED ORDER — ISOSORBIDE MONONITRATE ER 60 MG PO TB24
120.0000 mg | ORAL_TABLET | Freq: Every day | ORAL | Status: DC
  Administered 2016-12-09 – 2016-12-13 (×4): 120 mg via ORAL
  Filled 2016-12-09 (×4): qty 2

## 2016-12-09 MED ORDER — ALPRAZOLAM 0.5 MG PO TABS
0.5000 mg | ORAL_TABLET | Freq: Three times a day (TID) | ORAL | Status: DC | PRN
  Administered 2016-12-10 (×2): 0.5 mg via ORAL
  Filled 2016-12-09: qty 1

## 2016-12-09 MED ORDER — ONDANSETRON HCL 4 MG/2ML IJ SOLN: 4.0000 mg | Freq: Four times a day (QID) | INTRAMUSCULAR | Status: DC | PRN

## 2016-12-09 MED ORDER — HYDROMORPHONE HCL 1 MG/ML IJ SOLN
1.0000 mg | Freq: Once | INTRAMUSCULAR | Status: AC
  Administered 2016-12-09: 1 mg via INTRAVENOUS
  Filled 2016-12-09: qty 1

## 2016-12-09 MED ORDER — PRIMIDONE 50 MG PO TABS
50.0000 mg | ORAL_TABLET | Freq: Every day | ORAL | Status: DC
  Administered 2016-12-09 – 2016-12-13 (×4): 50 mg via ORAL
  Filled 2016-12-09 (×6): qty 1

## 2016-12-09 MED ORDER — NITROGLYCERIN 0.4 MG SL SUBL: 0.4000 mg | SUBLINGUAL_TABLET | SUBLINGUAL | Status: DC | PRN

## 2016-12-09 MED ORDER — MORPHINE SULFATE (PF) 2 MG/ML IV SOLN
1.0000 mg | INTRAVENOUS | Status: DC | PRN
  Administered 2016-12-09: 2 mg via INTRAVENOUS
  Administered 2016-12-09 – 2016-12-10 (×3): 3 mg via INTRAVENOUS
  Administered 2016-12-10: 2 mg via INTRAVENOUS
  Administered 2016-12-10 – 2016-12-11 (×2): 3 mg via INTRAVENOUS
  Administered 2016-12-12 – 2016-12-13 (×3): 2 mg via INTRAVENOUS
  Filled 2016-12-09 (×2): qty 1
  Filled 2016-12-09: qty 2
  Filled 2016-12-09 (×2): qty 1
  Filled 2016-12-09 (×2): qty 2
  Filled 2016-12-09: qty 1
  Filled 2016-12-09 (×2): qty 2

## 2016-12-09 MED ORDER — ONDANSETRON HCL 4 MG PO TABS
4.0000 mg | ORAL_TABLET | Freq: Once | ORAL | Status: AC
  Administered 2016-12-09: 4 mg via ORAL
  Filled 2016-12-09: qty 1

## 2016-12-09 MED ORDER — LISINOPRIL 20 MG PO TABS
20.0000 mg | ORAL_TABLET | Freq: Every day | ORAL | Status: DC
  Administered 2016-12-09 – 2016-12-12 (×4): 20 mg via ORAL
  Filled 2016-12-09 (×4): qty 1

## 2016-12-09 MED ORDER — FOLIC ACID 1 MG PO TABS
1.0000 mg | ORAL_TABLET | Freq: Every day | ORAL | Status: DC
  Administered 2016-12-11 – 2016-12-13 (×3): 1 mg via ORAL
  Filled 2016-12-09 (×3): qty 1

## 2016-12-09 MED ORDER — ACETAMINOPHEN 325 MG PO TABS: 650.0000 mg | ORAL_TABLET | ORAL | Status: DC | PRN

## 2016-12-09 MED ORDER — ENOXAPARIN SODIUM 40 MG/0.4ML ~~LOC~~ SOLN: 40.0000 mg | SUBCUTANEOUS | Status: DC

## 2016-12-09 MED ORDER — INSULIN ASPART 100 UNIT/ML ~~LOC~~ SOLN
0.0000 [IU] | SUBCUTANEOUS | Status: DC
  Administered 2016-12-09: 7 [IU] via SUBCUTANEOUS
  Administered 2016-12-09 – 2016-12-10 (×2): 4 [IU] via SUBCUTANEOUS
  Administered 2016-12-10 (×2): 7 [IU] via SUBCUTANEOUS
  Administered 2016-12-10 – 2016-12-11 (×5): 4 [IU] via SUBCUTANEOUS
  Administered 2016-12-11: 11 [IU] via SUBCUTANEOUS
  Administered 2016-12-11: 7 [IU] via SUBCUTANEOUS
  Administered 2016-12-12: 11 [IU] via SUBCUTANEOUS
  Administered 2016-12-12: 4 [IU] via SUBCUTANEOUS
  Administered 2016-12-12: 7 [IU] via SUBCUTANEOUS
  Administered 2016-12-12: 4 [IU] via SUBCUTANEOUS

## 2016-12-09 NOTE — Consult Note (Signed)
CHART REVIEWED PT WITH COMMINUTED RIGHT DISTAL RADIUS FRACTURE WILL GET CT SCAN TO ASSESS LUNATE FACET NPO AT MIDNIGHT SURGERY TOMORROW WILL DISCUSS WITH PRIMARY SERVICE ABOUT NEED FOR CARDIOLOGY EVAL PREOP GIVEN DOCUMENTED HISTORY

## 2016-12-09 NOTE — H&P (Signed)
History   Robert [name] is an 66 y.o. male.   Chief Complaint:  Chief Complaint  Patient presents with  . Fall    HPI 66 year old morbidly obese male with hypertension, coronary artery disease, GERD, previous mellitus type II, rheumatoid arthritis fell down his basement stairs which was about 8 feet with no loss of consciousness. He immediate had back pain and right wrist pain. He did land on his left side. He was taken to [facility] and evaluated and found to have a T3 compression fracture, skull fracture, and right wrist fracture. Transfer was requested to our trauma center  He complains of back pain and right wrist pain. Otherwise he denies neck pain, chest pain, abdominal pain or lower extremity pain. He denies any knee pain. He has chronic arthritis for which he is on several medications. He has chronic peripheral neuropathy from his diabetes. He states his numbness and tingling is no worse than usual. He denies any vision changes. He denies any shortness of breath.  He continues to smoke. A pack will last 2 days. He denies drugs and alcohol. Past Medical History:  Diagnosis Date  . Anxiety   . COPD (chronic obstructive pulmonary disease) ([place])   . Coronary artery disease   . Full dentures   . GERD (gastroesophageal reflux disease)    Rolaids as needed  . High cholesterol   . History of MI (myocardial infarction)   . Hypertension    med. dosage increased 04/2016; has been on BP med. x 10 yrs.  . Incarcerated umbilical hernia [id number]  . Inguinal hernia 05/2016   bilateral   . Insulin dependent diabetes mellitus ([place])   . Ischemic cardiomyopathy    a. 03/2015 EF 40-45% by LV gram.  . Rheumatoid arthritis(714.0)     Past Surgical History:  Procedure Laterality Date  . CARDIAC CATHETERIZATION N/A 02/01/2015   Procedure: Left Heart Cath and Coronary Angiography;  Surgeon: Belva Crome, MD;  Location: [place] [facility];  Service: Cardiovascular;  Laterality: N/A;  .  CARDIAC CATHETERIZATION N/A 03/22/2015   Procedure: Left Heart Cath and Coronary Angiography;  Surgeon: Leonie Man, MD;  Location: [place] [facility];  Service: Cardiovascular;  Laterality: N/A;  . CARDIAC CATHETERIZATION  09/05/2002; 03/09/2003; 04/10/2005;06/02/2005; 05/09/2006; 02/09/2007; 03/01/2007  . CORONARY ANGIOPLASTY  11/14/2012; 02/01/2015  . CORONARY ANGIOPLASTY WITH STENT PLACEMENT  05/16/2012   "1; makes total ~ 4"  . INSERTION OF MESH Bilateral 05/24/2016   Procedure: INSERTION OF MESH;  Surgeon: Coralie Keens, MD;  Location: [facility];  Service: General;  Laterality: Bilateral;  . LAPAROSCOPIC CHOLECYSTECTOMY    . LAPAROSCOPIC INGUINAL HERNIA WITH UMBILICAL HERNIA Bilateral 05/24/2016   Procedure: BILATERAL LAPAROSCOPIC INGUINAL HERNIA REPAIR WITH MESH AND UMBILICAL HERNIA REPAIR WITH MESH;  Surgeon: Coralie Keens, MD;  Location: [place];  Service: General;  Laterality: Bilateral;  . LEFT HEART CATHETERIZATION WITH CORONARY ANGIOGRAM N/A 12/22/2011   Procedure: LEFT HEART CATHETERIZATION WITH CORONARY ANGIOGRAM;  Surgeon: Troy Sine, MD;  Location: [facility] CATH LAB;  Service: Cardiovascular;  Laterality: N/A;  . LEFT HEART CATHETERIZATION WITH CORONARY ANGIOGRAM Bilateral 05/16/2012   Procedure: LEFT HEART CATHETERIZATION WITH CORONARY ANGIOGRAM;  Surgeon: Lorretta Harp, MD;  Location: [facility] CATH LAB;  Service: Cardiovascular;  Laterality: Bilateral;  . LEFT HEART CATHETERIZATION WITH CORONARY ANGIOGRAM N/A 11/14/2012   Procedure: LEFT HEART CATHETERIZATION WITH CORONARY ANGIOGRAM;  Surgeon: Troy Sine, MD;  Location: [facility] CATH LAB;  Service:  Cardiovascular;  Laterality: N/A;  . LEFT HEART CATHETERIZATION WITH CORONARY ANGIOGRAM N/A 09/01/2013   Procedure: LEFT HEART CATHETERIZATION WITH CORONARY ANGIOGRAM;  Surgeon: Leonie Man, MD;  Location: [facility] CATH LAB;  Service: Cardiovascular;  Laterality: N/A;  . [place]; 1973; 1985   x 3  .  NM MYOCAR PERF WALL MOTION  08/30/2009   No significant ischemia  . PERCUTANEOUS CORONARY INTERVENTION-BALLOON ONLY  12/22/2011   Procedure: PERCUTANEOUS CORONARY INTERVENTION-BALLOON ONLY;  Surgeon: Troy Sine, MD;  Location: [facility] CATH LAB;  Service: Cardiovascular;;  . PERCUTANEOUS CORONARY INTERVENTION-BALLOON ONLY  11/14/2012   Procedure: PERCUTANEOUS CORONARY INTERVENTION-BALLOON ONLY;  Surgeon: Troy Sine, MD;  Location: [facility] CATH LAB;  Service: Cardiovascular;;  . PERCUTANEOUS CORONARY STENT INTERVENTION (PCI-S)  05/16/2012   Procedure: PERCUTANEOUS CORONARY STENT INTERVENTION (PCI-S);  Surgeon: Lorretta Harp, MD;  Location: [facility] CATH LAB;  Service: Cardiovascular;;  . PERCUTANEOUS CORONARY STENT INTERVENTION (PCI-S)  09/01/2013   Procedure: PERCUTANEOUS CORONARY STENT INTERVENTION (PCI-S);  Surgeon: Leonie Man, MD;  Location: [facility] CATH LAB;  Service: Cardiovascular;;    Family History  Problem Relation Age of Onset  . Colon cancer Father   . Hypertension Mother   . Heart disease Mother   . Cancer Mother    Social History: He continues to smoke. A pack will last 2 days. He has never used smokeless tobacco. He reports that he does not drink alcohol or use drugs.  Allergies   Allergies  Allergen Reactions  . Ivp Dye [Iodinated Diagnostic Agents] Itching and Rash  . Plavix [Clopidogrel Bisulfate] Rash    Home Medications   (Not in a hospital admission)  Trauma Course   Results for orders placed or performed during the hospital encounter of 12/09/16 (from the past 48 hour(s))  CBC with Differential     Status: Abnormal   Collection Time: 12/09/16 11:14 AM  Result Value Ref Range   WBC 12.3 (H) 4.0 - 10.5 K/uL   RBC 5.20 4.22 - 5.81 MIL/uL   Hemoglobin 17.6 (H) 13.0 - 17.0 g/dL   HCT 50.5 39.0 - 52.0 %   MCV 97.1 78.0 - 100.0 fL   MCH 33.8 26.0 - 34.0 pg   MCHC 34.9 30.0 - 36.0 g/dL   RDW 14.9 11.5 - 15.5 %   Platelets 93 (L) 150 - 400 K/uL    Comment: SPECIMEN  CHECKED FOR CLOTS PLATELET COUNT CONFIRMED BY SMEAR    Neutrophils Relative % 85 %   Neutro Abs 10.4 (H) 1.7 - 7.7 K/uL   Lymphocytes Relative 9 %   Lymphs Abs 1.1 0.7 - 4.0 K/uL   Monocytes Relative 6 %   Monocytes Absolute 0.8 0.1 - 1.0 K/uL   Eosinophils Relative 0 %   Eosinophils Absolute 0.0 0.0 - 0.7 K/uL   Basophils Relative 0 %   Basophils Absolute 0.0 0.0 - 0.1 K/uL  Comprehensive metabolic panel     Status: Abnormal   Collection Time: 12/09/16 11:14 AM  Result Value Ref Range   Sodium 135 135 - 145 mmol/L   Potassium 4.4 3.5 - 5.1 mmol/L   Chloride 102 101 - 111 mmol/L   CO2 23 22 - 32 mmol/L   Glucose, Bld 278 (H) 65 - 99 mg/dL   BUN 15 6 - 20 mg/dL   Creatinine, Ser 0.84 0.61 - 1.24 mg/dL   Calcium 9.2 8.9 - 10.3 mg/dL   Total Protein 6.7 6.5 - 8.1 g/dL   Albumin 4.0  3.5 - 5.0 g/dL   AST 35 15 - 41 U/L   ALT 38 17 - 63 U/L   Alkaline Phosphatase 71 38 - 126 U/L   Total Bilirubin 0.7 0.3 - 1.2 mg/dL   GFR calc non Af Amer >60 >60 mL/min   GFR calc Af Amer >60 >60 mL/min    Comment: (NOTE) The eGFR has been calculated using the CKD EPI equation. This calculation has not been validated in all clinical situations. eGFR's persistently <60 mL/min signify possible Chronic Kidney Disease.    Anion gap 10 5 - 15   Dg Wrist Complete Right  Result Date: 12/09/2016 CLINICAL DATA:  Fall onto outstretched right hand this morning. EXAM: RIGHT WRIST - COMPLETE 3+ VIEW COMPARISON:  None. FINDINGS: There is a comminuted fracture of the distal radius. The primary fracture is transverse with secondary fractures extending to the radial and lunate process of the distal radial articular surface. The fracture is impacted dorsally leading to dorsal angulation of the articular surface of 21 degrees. No other fractures.  No dislocation. There is surrounding soft tissue swelling. IMPRESSION: 1. Comminuted fracture of the distal radius with dorsal impaction and dorsal angulation of the  articular surface. Electronically Signed   By: Lajean Manes M.D.   On: 12/09/2016 09:39   Ct Head Wo Contrast  Result Date: 12/09/2016 CLINICAL DATA:  fell down 8 to 10 steps. No LOC. c/o headache, pain between shoulder blades, Pain with deep breath or cough. EXAM: CT HEAD WITHOUT CONTRAST CT CERVICAL SPINE WITHOUT CONTRAST TECHNIQUE: Multidetector CT imaging of the head and cervical spine was performed following the standard protocol without intravenous contrast. Multiplanar CT image reconstructions of the cervical spine were also generated. COMPARISON:  None. FINDINGS: CT HEAD FINDINGS Brain: No evidence of acute infarction, hemorrhage, hydrocephalus, extra-axial collection or mass lesion/mass effect. There is ventricular and sulcal enlargement reflecting mild to moderate atrophy, advanced for age. Vascular: No hyperdense vessel or unexpected calcification. Skull: The well-defined lucency crosses the left lateral frontal bone extending to the left sphenoid bone. There is also a lucency across the mid left zygoma. These have the appearance of nondisplaced fractures, but may be chronic. There is no associated soft tissue swelling. No skull lesion. Sinuses/Orbits: Globes and orbits are unremarkable. There is dependent hyperattenuating fluid in the right sphenoid sinus. Mucosal thickening lines the maxillary sinuses with mild mucosal thickening lining the ethmoid air cells and inferior right frontal sinus. Clear mastoid air cells. Other: None. CT CERVICAL SPINE FINDINGS Alignment: Normal. Skull base and vertebrae: No acute fracture. No primary bone lesion or focal pathologic process. Soft tissues and spinal canal: No prevertebral fluid or swelling. No visible canal hematoma. Disc levels: Moderate loss of disc height at Celsius 5-C6 with mild loss of disc height at C6-C7. Spondylitis bulging with uncovertebral spurring causes moderate right and mild left neural foraminal narrowing at this level. No significant  central stenosis. No convincing disc herniation. Upper chest: Unremarkable. Other: None. IMPRESSION: HEAD CT 1. No acute intracranial abnormalities.  No intracranial hemorrhage. 2. Atrophy advanced for age. 3. Apparent nondisplaced left lateral frontal bone skull fracture and mid zygoma fracture. These are likely chronic given the lack of associated soft tissue swelling. CERVICAL CT 1. No fracture or acute finding. Electronically Signed   By: Lajean Manes M.D.   On: 12/09/2016 10:17   Ct Chest Wo Contrast  Result Date: 12/09/2016 CLINICAL DATA:  Pain after fall. EXAM: CT CHEST WITHOUT CONTRAST TECHNIQUE: Multidetector CT imaging of  the chest was performed following the standard protocol without IV contrast. COMPARISON:  Chest x-ray from earlier today FINDINGS: Cardiovascular: Atherosclerotic changes are seen in the thoracic aorta without aneurysmal dilatation. No effusions. The central pulmonary arteries are normal. Coronary artery calcifications are identified. The heart size is mildly enlarged. Mediastinum/Nodes: The right thyroid lobe is larger than the left but within normal limits. No thyroid nodules are identified. The esophagus is normal. No adenopathy. Lungs/Pleura: The central airways are within normal limits. No pneumothorax. Scattered atelectasis in the left lung. Paraseptal emphysematous changes seen in the apices, right greater than left. No suspicious pulmonary nodules, masses, or focal infiltrates are identified. Upper Abdomen: Evaluation of the upper abdomen is limited but unremarkable. Previous cholecystectomy. Musculoskeletal: There is a comminuted compression fracture of T3 with approximately 50% loss of vertebral body height. The posterior border of the T3 vertebral body is convex posteriorly with likely flattening of the thecal sac. There appears to be a small mildly retropulsed fragment as seen on sagittal image 99 and axial image 39. The T3 fracture also involves the right T3 inferior  facet as seen on sagittal image 94. No definitive involvement of the left facet. Lucency projected through the left T3 facet on sagittal image 105 is not seen on adjacent images and may represent a nutrient foramen or artifact. The pedicles are intact. The spinous process at this level is also intact. There is no listhesis seen at this level. No other fractures are seen in the visualized spine. The remainder of the bones are intact. Specifically, no rib fractures are identified. IMPRESSION: 1. Comminuted T3 compression fracture with approximately 50% loss of vertebral height. There is convexity to the posterior border of T3 with an apparent small mildly retropulsed fragment and probable flattening of the the thecal sac. There is also a fracture through the right T3 inferior facet. Lucency through the left T3 facet only seen on 1 image may be a nutrient foramen or artifact. 2. Scatter atelectasis in the left lung. These findings were called to Dr. Lacinda Axon in the emergency room. Electronically Signed   By: Dorise Bullion III M.D   On: 12/09/2016 10:37   Ct Cervical Spine Wo Contrast  Result Date: 12/09/2016 CLINICAL DATA:  fell down 8 to 10 steps. No LOC. c/o headache, pain between shoulder blades, Pain with deep breath or cough. EXAM: CT HEAD WITHOUT CONTRAST CT CERVICAL SPINE WITHOUT CONTRAST TECHNIQUE: Multidetector CT imaging of the head and cervical spine was performed following the standard protocol without intravenous contrast. Multiplanar CT image reconstructions of the cervical spine were also generated. COMPARISON:  None. FINDINGS: CT HEAD FINDINGS Brain: No evidence of acute infarction, hemorrhage, hydrocephalus, extra-axial collection or mass lesion/mass effect. There is ventricular and sulcal enlargement reflecting mild to moderate atrophy, advanced for age. Vascular: No hyperdense vessel or unexpected calcification. Skull: The well-defined lucency crosses the left lateral frontal bone extending to the  left sphenoid bone. There is also a lucency across the mid left zygoma. These have the appearance of nondisplaced fractures, but may be chronic. There is no associated soft tissue swelling. No skull lesion. Sinuses/Orbits: Globes and orbits are unremarkable. There is dependent hyperattenuating fluid in the right sphenoid sinus. Mucosal thickening lines the maxillary sinuses with mild mucosal thickening lining the ethmoid air cells and inferior right frontal sinus. Clear mastoid air cells. Other: None. CT CERVICAL SPINE FINDINGS Alignment: Normal. Skull base and vertebrae: No acute fracture. No primary bone lesion or focal pathologic process. Soft tissues and  spinal canal: No prevertebral fluid or swelling. No visible canal hematoma. Disc levels: Moderate loss of disc height at Celsius 5-C6 with mild loss of disc height at C6-C7. Spondylitis bulging with uncovertebral spurring causes moderate right and mild left neural foraminal narrowing at this level. No significant central stenosis. No convincing disc herniation. Upper chest: Unremarkable. Other: None. IMPRESSION: HEAD CT 1. No acute intracranial abnormalities.  No intracranial hemorrhage. 2. Atrophy advanced for age. 3. Apparent nondisplaced left lateral frontal bone skull fracture and mid zygoma fracture. These are likely chronic given the lack of associated soft tissue swelling. CERVICAL CT 1. No fracture or acute finding. Electronically Signed   By: Lajean Manes M.D.   On: 12/09/2016 10:17   Dg Chest Portable 1 View  Result Date: 12/09/2016 CLINICAL DATA:  Dyspnea after falling down 8-9 steps today. Abrasion and bleeding on the anterior left chest. EXAM: PORTABLE CHEST 1 VIEW COMPARISON:  03/20/2015. FINDINGS: Interval enlargement of the cardiac silhouette with increased prominence of the pulmonary vasculature and interstitial markings. Progressive aortic arch calcification. Coronary artery stent, better seen on the previous lateral view dated 12/30/2014.  No visible fracture or pneumothorax. No pleural fluid. IMPRESSION: Interval cardiomegaly and minimal changes of congestive heart failure. Electronically Signed   By: Claudie Revering M.D.   On: 12/09/2016 09:35    Review of Systems  Constitutional: Negative for diaphoresis.  HENT: Negative for hearing loss and nosebleeds.   Eyes: Negative for blurred vision and double vision.  Respiratory: Negative for shortness of breath and wheezing.   Cardiovascular: Negative for chest pain.  Gastrointestinal: Negative for abdominal pain, nausea and vomiting.  Genitourinary: Negative for flank pain.  Musculoskeletal: Positive for back pain and joint pain (chronic - has RA). Negative for neck pain.  Skin: Negative for itching and rash.  Neurological: Negative for dizziness, seizures, loss of consciousness, weakness and headaches.  Psychiatric/Behavioral: Negative for substance abuse and suicidal ideas.  All other systems reviewed and are negative.   Blood pressure (!) 143/80, pulse (!) 102, temperature 98.5 F (36.9 C), temperature source Oral, resp. rate 20, SpO2 93 %. Physical Exam  Vitals reviewed. Constitutional: He is oriented to person, place, and time. He appears well-developed and well-nourished. He is cooperative. No distress. Cervical collar and nasal cannula in place.  Ruddy complexion  HENT:  Head: Normocephalic. Head is with abrasion. Head is without raccoon's eyes, without Battle's sign, without contusion and without laceration.    Right Ear: Hearing, tympanic membrane, external ear and ear canal normal. No lacerations. No drainage or tenderness. No foreign bodies. Tympanic membrane is not perforated. No hemotympanum.  Left Ear: Hearing, tympanic membrane, external ear and ear canal normal. No lacerations. No drainage or tenderness. No foreign bodies. Tympanic membrane is not perforated. No hemotympanum.  Nose: Nose normal. No nose lacerations, sinus tenderness, nasal deformity or nasal  septal hematoma. No epistaxis.  Mouth/Throat: Uvula is midline, oropharynx is clear and moist and mucous membranes are normal. No lacerations.  Bruising L cheekbone; abrasion L lateral frontal bone  Eyes: Conjunctivae, EOM and lids are normal. Pupils are equal, round, and reactive to light. No scleral icterus.  Neck: Trachea normal and normal range of motion. Neck supple. No JVD present. No spinous process tenderness and no muscular tenderness present. Carotid bruit is not present. No thyromegaly present.  Cardiovascular: Normal rate, regular rhythm, normal heart sounds, intact distal pulses and normal pulses.   Respiratory: Effort normal and breath sounds normal. No respiratory distress. He has no  wheezes. He exhibits no tenderness, no bony tenderness, no laceration and no crepitus.  GI: Soft. Normal appearance. He exhibits no distension. Bowel sounds are decreased. There is no tenderness. There is no rigidity, no rebound, no guarding and no CVA tenderness. No hernia. Hernia confirmed negative in the ventral area.    Old infraumbilical incision  Musculoskeletal: Normal range of motion. He exhibits edema (trace b/l LE).       Right wrist: He exhibits tenderness.       Thoracic back: He exhibits bony tenderness.  Right forearm splinted. Good cap refill  Lymphadenopathy:    He has no cervical adenopathy.  Neurological: He is alert and oriented to person, place, and time. He has normal strength. No cranial nerve deficit or sensory deficit. GCS eye subscore is 4. GCS verbal subscore is 5. GCS motor subscore is 6.  Skin: Skin is warm, dry and intact. He is not diaphoretic.     b/l knee abrasions, L 1st MCP joint abrasion, L great toe abrasion; brawny LE skin  Psychiatric: He has a normal mood and affect. His speech is normal and behavior is normal.     Assessment/Plan s/p fall L frontal skull fracture T3 comminuted Fx with loss of height Comminuted Rt radius fx Multiple  abrasions HTN CAD DM2 obese on insulin Tobacco use RA Morbid obesity  Admit Consult hand surgery Consult neurosurgery - spoke with Dr Forde Dandy wound care Home meds Chemical vte prophylaxis Npo x meds until know recs from specialists  [facility] M 12/09/2016, 2:00 PM   Procedures

## 2016-12-09 NOTE — Anesthesia Preprocedure Evaluation (Addendum)
Anesthesia Evaluation  Patient identified by MRN, date of birth, ID band Patient awake    Reviewed: Allergy & Precautions, NPO status , Patient's Chart, lab work & pertinent test results  History of Anesthesia Complications Negative for: history of anesthetic complications  Airway Mallampati: II  TM Distance: >3 FB Neck ROM: Full    Dental no notable dental hx. (+) Dental Advisory Given   Pulmonary neg pulmonary ROS, former smoker,    Pulmonary exam normal        Cardiovascular hypertension, Pt. on home beta blockers + CAD, + Past MI, + Cardiac Stents and +CHF  Normal cardiovascular exam     Neuro/Psych negative neurological ROS  negative psych ROS   GI/Hepatic Neg liver ROS, GERD  ,  Endo/Other  diabetesMorbid obesity  Renal/GU negative Renal ROS  negative genitourinary   Musculoskeletal negative musculoskeletal ROS (+)   Abdominal   Peds negative pediatric ROS (+)  Hematology negative hematology ROS (+)   Anesthesia Other Findings   Reproductive/Obstetrics negative OB ROS                           Anesthesia Physical  Anesthesia Plan  ASA: III  Anesthesia Plan: MAC and Regional   Post-op Pain Management:    Induction:   Airway Management Planned: Natural Airway and Simple Face Mask  Additional Equipment:   Intra-op Plan:   Post-operative Plan:   Informed Consent: I have reviewed the patients History and Physical, chart, labs and discussed the procedure including the risks, benefits and alternatives for the proposed anesthesia with the patient or authorized representative who has indicated his/her understanding and acceptance.   Dental advisory given  Plan Discussed with: CRNA and Anesthesiologist  Anesthesia Plan Comments:      Anesthesia Quick Evaluation

## 2016-12-09 NOTE — Consult Note (Signed)
Reason for Consult:T 3 fracture Referring Physician: [name] is a 66 y.o. male.  Patient is a 66 year old  male who presents to the emergency department by EMS following a fall down 8 or 10 steps.  The patient states that he was going down some steps. He states that he lost his footing, and fell down 8 or 10 steps earlier this morning. He denies any loss of consciousness. He states he did hit his head. His wife states that there was a large clump of hair that was noted on the steps from his fall. The patient complains of headache, left greater than right. He has some mild facial pain on the left. He complains of severe pain of the area between his shoulder blades and extending down the midportion of his back. He also complains of pain of the right wrist. He denies being on any anticoagulation medications. There's no history of any bleeding disorders. He states that he has some sensation of being short of breath. He states that he has some breathing issues from time to time, but they seem to be a little worse this morning after his fall. He denies any pelvis area pain. He has some abrasions of his knees, but no difficulty with standing or walking. The patient states that he walked from the EMS stretcher to the emergency department stretcher. Patient states he is up-to-date on his tetanus status.   CT scan of head reveals linear non-displaced left frontal bone fracture.  He also has a comminuted T 3 fracture with 50 % height loss and a small amount of bone in spinal canal and mild kyphosis.     Past Medical History:  Diagnosis Date  . Anxiety   . COPD (chronic obstructive pulmonary disease) ([place])   . Coronary artery disease   . Full dentures   . GERD (gastroesophageal reflux disease)    Rolaids as needed  . High cholesterol   . History of MI (myocardial infarction)   . Hypertension    med. dosage increased 04/2016; has been on BP med. x 10 yrs.  . Incarcerated umbilical hernia 81/4481    . Inguinal hernia 05/2016   bilateral   . Insulin dependent diabetes mellitus ([place])   . Ischemic cardiomyopathy    a. 03/2015 EF 40-45% by LV gram.  . Rheumatoid arthritis(714.0)     Past Surgical History:  Procedure Laterality Date  . CARDIAC CATHETERIZATION N/A 02/01/2015   Procedure: Left Heart Cath and Coronary Angiography;  Surgeon: Belva Crome, MD;  Location: [facility];  Service: Cardiovascular;  Laterality: N/A;  . CARDIAC CATHETERIZATION N/A 03/22/2015   Procedure: Left Heart Cath and Coronary Angiography;  Surgeon: Leonie Man, MD;  Location: [facility];  Service: Cardiovascular;  Laterality: N/A;  . CARDIAC CATHETERIZATION  09/05/2002; 03/09/2003; 04/10/2005;06/02/2005; 05/09/2006; 02/09/2007; 03/01/2007  . CORONARY ANGIOPLASTY  11/14/2012; 02/01/2015  . CORONARY ANGIOPLASTY WITH STENT PLACEMENT  05/16/2012   "1; makes total ~ 4"  . INSERTION OF MESH Bilateral 05/24/2016   Procedure: INSERTION OF MESH;  Surgeon: Coralie Keens, MD;  Location: [facility];  Service: General;  Laterality: Bilateral;  . LAPAROSCOPIC CHOLECYSTECTOMY    . LAPAROSCOPIC INGUINAL HERNIA WITH UMBILICAL HERNIA Bilateral 05/24/2016   Procedure: BILATERAL LAPAROSCOPIC INGUINAL HERNIA REPAIR WITH MESH AND UMBILICAL HERNIA REPAIR WITH MESH;  Surgeon: Coralie Keens, MD;  Location: [facility];  Service: General;  Laterality: Bilateral;  . LEFT HEART CATHETERIZATION WITH CORONARY ANGIOGRAM N/A 12/22/2011  Procedure: LEFT HEART CATHETERIZATION WITH CORONARY ANGIOGRAM;  Surgeon: Troy Sine, MD;  Location: [facility] CATH LAB;  Service: Cardiovascular;  Laterality: N/A;  . LEFT HEART CATHETERIZATION WITH CORONARY ANGIOGRAM Bilateral 05/16/2012   Procedure: LEFT HEART CATHETERIZATION WITH CORONARY ANGIOGRAM;  Surgeon: Lorretta Harp, MD;  Location: [facility] CATH LAB;  Service: Cardiovascular;  Laterality: Bilateral;  . LEFT HEART CATHETERIZATION WITH CORONARY ANGIOGRAM N/A 11/14/2012    Procedure: LEFT HEART CATHETERIZATION WITH CORONARY ANGIOGRAM;  Surgeon: Troy Sine, MD;  Location: [facility] CATH LAB;  Service: Cardiovascular;  Laterality: N/A;  . LEFT HEART CATHETERIZATION WITH CORONARY ANGIOGRAM N/A 09/01/2013   Procedure: LEFT HEART CATHETERIZATION WITH CORONARY ANGIOGRAM;  Surgeon: Leonie Man, MD;  Location: [facility] CATH LAB;  Service: Cardiovascular;  Laterality: N/A;  . [place]; 1973; 1985   x 3  . NM MYOCAR PERF WALL MOTION  08/30/2009   No significant ischemia  . PERCUTANEOUS CORONARY INTERVENTION-BALLOON ONLY  12/22/2011   Procedure: PERCUTANEOUS CORONARY INTERVENTION-BALLOON ONLY;  Surgeon: Troy Sine, MD;  Location: [facility] CATH LAB;  Service: Cardiovascular;;  . PERCUTANEOUS CORONARY INTERVENTION-BALLOON ONLY  11/14/2012   Procedure: PERCUTANEOUS CORONARY INTERVENTION-BALLOON ONLY;  Surgeon: Troy Sine, MD;  Location: [facility] CATH LAB;  Service: Cardiovascular;;  . PERCUTANEOUS CORONARY STENT INTERVENTION (PCI-S)  05/16/2012   Procedure: PERCUTANEOUS CORONARY STENT INTERVENTION (PCI-S);  Surgeon: Lorretta Harp, MD;  Location: [facility] C[facility] LAB;  Service: Cardiovascular;;  . PERCUTANEOUS CORONARY STENT INTERVENTION (PCI-S)  09/01/2013   Procedure: PERCUTANEOUS CORONARY STENT INTERVENTION (PCI-S);  Surgeon: Leonie Man, MD;  Location: [facility] CATH LAB;  Service: Cardiovascular;;    Family History  Problem Relation Age of Onset  . Colon cancer Father   . Hypertension Mother   . Heart disease Mother   . Cancer Mother     Social History:  reports that he quit smoking about 17 months ago. He smoked 0.75 packs per day. He has never used smokeless tobacco. He reports that he does not drink alcohol or use drugs.  Allergies:  Allergies  Allergen Reactions  . Ivp Dye [Iodinated Diagnostic Agents] Itching and Rash  . Plavix [Clopidogrel Bisulfate] Rash    Medications: I have reviewed the patient's current medications.  Results for orders placed or  performed during the hospital encounter of 12/09/16 (from the past 48 hour(s))  CBC with Differential     Status: Abnormal   Collection Time: 12/09/16 11:14 AM  Result Value Ref Range   WBC 12.3 (H) 4.0 - 10.5 K/uL   RBC 5.20 4.22 - 5.81 MIL/uL   Hemoglobin 17.6 (H) 13.0 - 17.0 g/dL   HCT 50.5 39.0 - 52.0 %   MCV 97.1 78.0 - 100.0 fL   MCH 33.8 26.0 - 34.0 pg   MCHC 34.9 30.0 - 36.0 g/dL   RDW 14.9 11.5 - 15.5 %   Platelets 93 (L) 150 - 400 K/uL    Comment: SPECIMEN CHECKED FOR CLOTS PLATELET COUNT CONFIRMED BY SMEAR    Neutrophils Relative % 85 %   Neutro Abs 10.4 (H) 1.7 - 7.7 K/uL   Lymphocytes Relative 9 %   Lymphs Abs 1.1 0.7 - 4.0 K/uL   Monocytes Relative 6 %   Monocytes Absolute 0.8 0.1 - 1.0 K/uL   Eosinophils Relative 0 %   Eosinophils Absolute 0.0 0.0 - 0.7 K/uL   Basophils Relative 0 %   Basophils Absolute 0.0 0.0 - 0.1 K/uL  Comprehensive metabolic panel     Status:  Abnormal   Collection Time: 12/09/16 11:14 AM  Result Value Ref Range   Sodium 135 135 - 145 mmol/L   Potassium 4.4 3.5 - 5.1 mmol/L   Chloride 102 101 - 111 mmol/L   CO2 23 22 - 32 mmol/L   Glucose, Bld 278 (H) 65 - 99 mg/dL   BUN 15 6 - 20 mg/dL   Creatinine, Ser 0.84 0.61 - 1.24 mg/dL   Calcium 9.2 8.9 - 10.3 mg/dL   Total Protein 6.7 6.5 - 8.1 g/dL   Albumin 4.0 3.5 - 5.0 g/dL   AST 35 15 - 41 U/L   ALT 38 17 - 63 U/L   Alkaline Phosphatase 71 38 - 126 U/L   Total Bilirubin 0.7 0.3 - 1.2 mg/dL   GFR calc non Af Amer >60 >60 mL/min   GFR calc Af Amer >60 >60 mL/min    Comment: (NOTE) The eGFR has been calculated using the CKD EPI equation. This calculation has not been validated in all clinical situations. eGFR's persistently <60 mL/min signify possible Chronic Kidney Disease.    Anion gap 10 5 - 15    Dg Wrist Complete Right  Result Date: 12/09/2016 CLINICAL DATA:  Fall onto outstretched right hand this morning. EXAM: RIGHT WRIST - COMPLETE 3+ VIEW COMPARISON:  None. FINDINGS: There  is a comminuted fracture of the distal radius. The primary fracture is transverse with secondary fractures extending to the radial and lunate process of the distal radial articular surface. The fracture is impacted dorsally leading to dorsal angulation of the articular surface of 21 degrees. No other fractures.  No dislocation. There is surrounding soft tissue swelling. IMPRESSION: 1. Comminuted fracture of the distal radius with dorsal impaction and dorsal angulation of the articular surface. Electronically Signed   By: Lajean Manes M.D.   On: 12/09/2016 09:39   Ct Head Wo Contrast  Result Date: 12/09/2016 CLINICAL DATA:  fell down 8 to 10 steps. No LOC. c/o headache, pain between shoulder blades, Pain with deep breath or cough. EXAM: CT HEAD WITHOUT CONTRAST CT CERVICAL SPINE WITHOUT CONTRAST TECHNIQUE: Multidetector CT imaging of the head and cervical spine was performed following the standard protocol without intravenous contrast. Multiplanar CT image reconstructions of the cervical spine were also generated. COMPARISON:  None. FINDINGS: CT HEAD FINDINGS Brain: No evidence of acute infarction, hemorrhage, hydrocephalus, extra-axial collection or mass lesion/mass effect. There is ventricular and sulcal enlargement reflecting mild to moderate atrophy, advanced for age. Vascular: No hyperdense vessel or unexpected calcification. Skull: The well-defined lucency crosses the left lateral frontal bone extending to the left sphenoid bone. There is also a lucency across the mid left zygoma. These have the appearance of nondisplaced fractures, but may be chronic. There is no associated soft tissue swelling. No skull lesion. Sinuses/Orbits: Globes and orbits are unremarkable. There is dependent hyperattenuating fluid in the right sphenoid sinus. Mucosal thickening lines the maxillary sinuses with mild mucosal thickening lining the ethmoid air cells and inferior right frontal sinus. Clear mastoid air cells. Other:  None. CT CERVICAL SPINE FINDINGS Alignment: Normal. Skull base and vertebrae: No acute fracture. No primary bone lesion or focal pathologic process. Soft tissues and spinal canal: No prevertebral fluid or swelling. No visible canal hematoma. Disc levels: Moderate loss of disc height at Celsius 5-C6 with mild loss of disc height at C6-C7. Spondylitis bulging with uncovertebral spurring causes moderate right and mild left neural foraminal narrowing at this level. No significant central stenosis. No convincing disc herniation. Upper  chest: Unremarkable. Other: None. IMPRESSION: HEAD CT 1. No acute intracranial abnormalities.  No intracranial hemorrhage. 2. Atrophy advanced for age. 3. Apparent nondisplaced left lateral frontal bone skull fracture and mid zygoma fracture. These are likely chronic given the lack of associated soft tissue swelling. CERVICAL CT 1. No fracture or acute finding. Electronically Signed   By: Lajean Manes M.D.   On: 12/09/2016 10:17   Ct Chest Wo Contrast  Result Date: 12/09/2016 CLINICAL DATA:  Pain after fall. EXAM: CT CHEST WITHOUT CONTRAST TECHNIQUE: Multidetector CT imaging of the chest was performed following the standard protocol without IV contrast. COMPARISON:  Chest x-ray from earlier today FINDINGS: Cardiovascular: Atherosclerotic changes are seen in the thoracic aorta without aneurysmal dilatation. No effusions. The central pulmonary arteries are normal. Coronary artery calcifications are identified. The heart size is mildly enlarged. Mediastinum/Nodes: The right thyroid lobe is larger than the left but within normal limits. No thyroid nodules are identified. The esophagus is normal. No adenopathy. Lungs/Pleura: The central airways are within normal limits. No pneumothorax. Scattered atelectasis in the left lung. Paraseptal emphysematous changes seen in the apices, right greater than left. No suspicious pulmonary nodules, masses, or focal infiltrates are identified. Upper  Abdomen: Evaluation of the upper abdomen is limited but unremarkable. Previous cholecystectomy. Musculoskeletal: There is a comminuted compression fracture of T3 with approximately 50% loss of vertebral body height. The posterior border of the T3 vertebral body is convex posteriorly with likely flattening of the thecal sac. There appears to be a small mildly retropulsed fragment as seen on sagittal image 99 and axial image 39. The T3 fracture also involves the right T3 inferior facet as seen on sagittal image 94. No definitive involvement of the left facet. Lucency projected through the left T3 facet on sagittal image 105 is not seen on adjacent images and may represent a nutrient foramen or artifact. The pedicles are intact. The spinous process at this level is also intact. There is no listhesis seen at this level. No other fractures are seen in the visualized spine. The remainder of the bones are intact. Specifically, no rib fractures are identified. IMPRESSION: 1. Comminuted T3 compression fracture with approximately 50% loss of vertebral height. There is convexity to the posterior border of T3 with an apparent small mildly retropulsed fragment and probable flattening of the the thecal sac. There is also a fracture through the right T3 inferior facet. Lucency through the left T3 facet only seen on 1 image may be a nutrient foramen or artifact. 2. Scatter atelectasis in the left lung. These findings were called to Dr. Lacinda Axon in the emergency room. Electronically Signed   By: Dorise Bullion III M.D   On: 12/09/2016 10:37   Ct Cervical Spine Wo Contrast  Result Date: 12/09/2016 CLINICAL DATA:  fell down 8 to 10 steps. No LOC. c/o headache, pain between shoulder blades, Pain with deep breath or cough. EXAM: CT HEAD WITHOUT CONTRAST CT CERVICAL SPINE WITHOUT CONTRAST TECHNIQUE: Multidetector CT imaging of the head and cervical spine was performed following the standard protocol without intravenous contrast.  Multiplanar CT image reconstructions of the cervical spine were also generated. COMPARISON:  None. FINDINGS: CT HEAD FINDINGS Brain: No evidence of acute infarction, hemorrhage, hydrocephalus, extra-axial collection or mass lesion/mass effect. There is ventricular and sulcal enlargement reflecting mild to moderate atrophy, advanced for age. Vascular: No hyperdense vessel or unexpected calcification. Skull: The well-defined lucency crosses the left lateral frontal bone extending to the left sphenoid bone. There is also  a lucency across the mid left zygoma. These have the appearance of nondisplaced fractures, but may be chronic. There is no associated soft tissue swelling. No skull lesion. Sinuses/Orbits: Globes and orbits are unremarkable. There is dependent hyperattenuating fluid in the right sphenoid sinus. Mucosal thickening lines the maxillary sinuses with mild mucosal thickening lining the ethmoid air cells and inferior right frontal sinus. Clear mastoid air cells. Other: None. CT CERVICAL SPINE FINDINGS Alignment: Normal. Skull base and vertebrae: No acute fracture. No primary bone lesion or focal pathologic process. Soft tissues and spinal canal: No prevertebral fluid or swelling. No visible canal hematoma. Disc levels: Moderate loss of disc height at Celsius 5-C6 with mild loss of disc height at C6-C7. Spondylitis bulging with uncovertebral spurring causes moderate right and mild left neural foraminal narrowing at this level. No significant central stenosis. No convincing disc herniation. Upper chest: Unremarkable. Other: None. IMPRESSION: HEAD CT 1. No acute intracranial abnormalities.  No intracranial hemorrhage. 2. Atrophy advanced for age. 3. Apparent nondisplaced left lateral frontal bone skull fracture and mid zygoma fracture. These are likely chronic given the lack of associated soft tissue swelling. CERVICAL CT 1. No fracture or acute finding. Electronically Signed   By: Lajean Manes M.D.   On:  12/09/2016 10:17   Dg Chest Portable 1 View  Result Date: 12/09/2016 CLINICAL DATA:  Dyspnea after falling down 8-9 steps today. Abrasion and bleeding on the anterior left chest. EXAM: PORTABLE CHEST 1 VIEW COMPARISON:  03/20/2015. FINDINGS: Interval enlargement of the cardiac silhouette with increased prominence of the pulmonary vasculature and interstitial markings. Progressive aortic arch calcification. Coronary artery stent, better seen on the previous lateral view dated 12/30/2014. No visible fracture or pneumothorax. No pleural fluid. IMPRESSION: Interval cardiomegaly and minimal changes of congestive heart failure. Electronically Signed   By: Claudie Revering M.D.   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 rate (!) 27, SpO2 92 %. Physical Exam  Constitutional: He is oriented to person, place, and time. He appears well-developed and well-nourished.  Obese, deconditioned  HENT:  Head: Head is with abrasion and with contusion. Hair is abnormal.    Eyes: Conjunctivae, EOM and lids are normal. Pupils are equal, round, and reactive to light.  Neck: Normal range of motion. Neck supple.  Neurological: He is alert and oriented to person, place, and time. He has normal strength and normal reflexes. No cranial nerve deficit or sensory deficit. GCS eye subscore is 4. GCS verbal subscore is 5. GCS motor subscore is 6.  Decreased sensation in both legs from neuropathy with bilateral lower extremity stasis changes     Assessment/Plan: Patient has Thoracic 3 fracture with mild kyphosis and minimal bony retropulsion.  I recommend immobilization in Cervical collar with thoracic extension (SOMI) brace.  This should heal and not require surgery.  I will follow patient for this as an outpatient with serial X rays.  He  will likely require immobilization for there next 6 - 8 weeks.  Frontal skull fracture should heal without intervention and will require no additional imaging.  Peggyann Shoals, MD 12/09/2016, 2:50 PM

## 2016-12-09 NOTE — ED Notes (Signed)
Dr. Redmond Pulling advised he remove patient's C-Collar, and is consulting neuro surgery and orthopedics.

## 2016-12-09 NOTE — ED Provider Notes (Signed)
[facility] DEPT Provider Note   CSN: [id number] Arrival date & time: 12/09/16  2637     History   Chief Complaint Chief Complaint  Patient presents with  . Fall    HPI Robert [name] is a 66 y.o. male.  Patient is a 66 year old  male who presents to the emergency department by EMS following a fall down 8 or 10 steps.  The patient states that he was going down some steps. He states that he lost his footing, and fell down 8 or 10 steps earlier this morning. He denies any loss of consciousness. He states he did hit his head. His wife states that there was a large clump of hair that was noted on the steps from his fall. The patient complains of headache, left greater than right. He has some mild facial pain on the left. He complains of severe pain of the area between his shoulder blades and extending down the midportion of his back. He also complains of pain of the right wrist. He denies being on any anticoagulation medications. There's no history of any bleeding disorders. He states that he has some sensation of being short of breath. He states that he has some breathing issues from time to time, but they seem to be a little worse this morning after his fall. He denies any pelvis area pain. He has some abrasions of his knees, but no difficulty with standing or walking. The patient states that he walked from the EMS stretcher to the emergency department stretcher. Patient states he is up-to-date on his tetanus status.       Past Medical History:  Diagnosis Date  . Anxiety   . COPD (chronic obstructive pulmonary disease) ([place])   . Coronary artery disease   . Full dentures   . GERD (gastroesophageal reflux disease)    Rolaids as needed  . High cholesterol   . History of MI (myocardial infarction)   . Hypertension    med. dosage increased 04/2016; has been on BP med. x 10 yrs.  . Incarcerated umbilical hernia 85/8850  . Inguinal hernia 05/2016   bilateral   . Insulin dependent  diabetes mellitus ([place])   . Ischemic cardiomyopathy    a. 03/2015 EF 40-45% by LV gram.  . Rheumatoid arthritis(714.0)     Patient Active Problem List   Diagnosis Date Noted  . Thrombocytopenia ([place]) 10/09/2016  . Cellulitis 10/08/2016  . Diabetes ([place]) 10/08/2016  . Chronic combined systolic and diastolic CHF (congestive heart failure) ([place]) 04/25/2016  . Coronary artery disease   . High cholesterol   . Type II diabetes mellitus ([place])   . Essential hypertension   . Non-STEMI (non-ST elevated myocardial infarction) ([place])   . CAD S/P percutaneous coronary angioplasty   . Unstable angina ([place]) 03/20/2015  . ICM-EF 35% at cath 02/01/15 02/02/2015  . CAD (coronary artery disease) 01/09/2015  . DM type 2 causing vascular disease, not at goal [facility]) 05/29/2014  . Dyspnea 10/20/2013  . COPD (chronic obstructive pulmonary disease) ([place]) 09/03/2013  . Hyperlipidemia   . Old myocardial infarction 09/01/2013  . Abnormal nuclear stress test 05/17/2012  . S/P CFX PTCAI for ISR 02/01/15 05/17/2012  . PVD, chronic LLE 12/22/2011  . Rheumatoid arthritis ([place]) 12/22/2011  . Contrast media allergy 12/22/2011  . Type II IDDM 12/21/2011  . HTN (hypertension) 12/21/2011  . Tobacco abuse 12/21/2011    Past Surgical History:  Procedure Laterality Date  . CARDIAC CATHETERIZATION N/A 02/01/2015   Procedure: Left  Heart Cath and Coronary Angiography;  Surgeon: Belva Crome, MD;  Location: [facility] CV LAB;  Service: Cardiovascular;  Laterality: N/A;  . CARDIAC CATHETERIZATION N/A 03/22/2015   Procedure: Left Heart Cath and Coronary Angiography;  Surgeon: Leonie Man, MD;  Location: [facility] CV LAB;  Service: Cardiovascular;  Laterality: N/A;  . CARDIAC CATHETERIZATION  09/05/2002; 03/09/2003; 04/10/2005;06/02/2005; 05/09/2006; 02/09/2007; 03/01/2007  . CORONARY ANGIOPLASTY  11/14/2012; 02/01/2015  . CORONARY ANGIOPLASTY WITH STENT PLACEMENT  05/16/2012   "1; makes total ~ 4"  . INSERTION OF MESH  Bilateral 05/24/2016   Procedure: INSERTION OF MESH;  Surgeon: Coralie Keens, MD;  Location: [facility];  Service: General;  Laterality: Bilateral;  . LAPAROSCOPIC CHOLECYSTECTOMY    . LAPAROSCOPIC INGUINAL HERNIA WITH UMBILICAL HERNIA Bilateral 05/24/2016   Procedure: BILATERAL LAPAROSCOPIC INGUINAL HERNIA REPAIR WITH MESH AND UMBILICAL HERNIA REPAIR WITH MESH;  Surgeon: Coralie Keens, MD;  Location: [facility];  Service: General;  Laterality: Bilateral;  . LEFT HEART CATHETERIZATION WITH CORONARY ANGIOGRAM N/A 12/22/2011   Procedure: LEFT HEART CATHETERIZATION WITH CORONARY ANGIOGRAM;  Surgeon: Troy Sine, MD;  Location: [facility] CATH LAB;  Service: Cardiovascular;  Laterality: N/A;  . LEFT HEART CATHETERIZATION WITH CORONARY ANGIOGRAM Bilateral 05/16/2012   Procedure: LEFT HEART CATHETERIZATION WITH CORONARY ANGIOGRAM;  Surgeon: Lorretta Harp, MD;  Location: [facility] CATH LAB;  Service: Cardiovascular;  Laterality: Bilateral;  . LEFT HEART CATHETERIZATION WITH CORONARY ANGIOGRAM N/A 11/14/2012   Procedure: LEFT HEART CATHETERIZATION WITH CORONARY ANGIOGRAM;  Surgeon: Troy Sine, MD;  Location: [facility] CATH LAB;  Service: Cardiovascular;  Laterality: N/A;  . LEFT HEART CATHETERIZATION WITH CORONARY ANGIOGRAM N/A 09/01/2013   Procedure: LEFT HEART CATHETERIZATION WITH CORONARY ANGIOGRAM;  Surgeon: Leonie Man, MD;  Location: [facility] CATH LAB;  Service: Cardiovascular;  Laterality: N/A;  . [place]; 1973; 1985   x 3  . NM MYOCAR PERF WALL MOTION  08/30/2009   No significant ischemia  . PERCUTANEOUS CORONARY INTERVENTION-BALLOON ONLY  12/22/2011   Procedure: PERCUTANEOUS CORONARY INTERVENTION-BALLOON ONLY;  Surgeon: Troy Sine, MD;  Location: [facility] CATH LAB;  Service: Cardiovascular;;  . PERCUTANEOUS CORONARY INTERVENTION-BALLOON ONLY  11/14/2012   Procedure: PERCUTANEOUS CORONARY INTERVENTION-BALLOON ONLY;  Surgeon: Troy Sine, MD;  Location: [facility] CATH LAB;   Service: Cardiovascular;;  . PERCUTANEOUS CORONARY STENT INTERVENTION (PCI-S)  05/16/2012   Procedure: PERCUTANEOUS CORONARY STENT INTERVENTION (PCI-S);  Surgeon: Lorretta Harp, MD;  Location: [facility] CATH LAB;  Service: Cardiovascular;;  . PERCUTANEOUS CORONARY STENT INTERVENTION (PCI-S)  09/01/2013   Procedure: PERCUTANEOUS CORONARY STENT INTERVENTION (PCI-S);  Surgeon: Leonie Man, MD;  Location: [facility] CATH LAB;  Service: Cardiovascular;;       Home Medications    Prior to Admission medications   Medication Sig Start Date End Date Taking? Authorizing Provider  ALPRAZolam Duanne Moron) 0.5 MG tablet Take 0.5 mg by mouth 3 (three) times daily as needed. For anxiety    Historical Provider, MD  aspirin EC 81 MG EC tablet Take 1 tablet (81 mg total) by mouth daily. 11/15/12   Brett Canales, PA-C  atorvastatin (LIPITOR) 80 MG tablet Take 1 tablet (80 mg total) by mouth daily at 6 PM. 05/08/16   Mihai Croitoru, MD  cephALEXin (KEFLEX) 500 MG capsule Take 1 capsule (500 mg total) by mouth 2 (two) times daily. Stop taking 10/17/2016 10/09/16   Ledell Noss, MD  fish oil-omega-3 fatty acids 1000 MG capsule Take 1 g by mouth daily.  Historical Provider, MD  folic acid (FOLVITE) 1 MG tablet Take 1 mg by mouth daily.     Historical Provider, MD  glipiZIDE (GLUCOTROL XL) 10 MG 24 hr tablet Take 10 mg by mouth 2 (two) times daily.    Historical Provider, MD  Insulin Glargine (LANTUS SOLOSTAR) 100 UNIT/ML Solostar Pen Inject 80 Units into the skin daily at 10 pm.    Historical Provider, MD  isosorbide mononitrate (IMDUR) 120 MG 24 hr tablet Take 120 mg by mouth daily.    Historical Provider, MD  metFORMIN (GLUCOPHAGE) 1000 MG tablet Take 1 tablet (1,000 mg total) by mouth 2 (two) times daily with a meal. 03/23/15   Bhavinkumar Bhagat, PA  metoprolol tartrate (LOPRESSOR) 25 MG tablet TAKE 1 TABLET (25 MG TOTAL) BY MOUTH 2 (TWO) TIMES DAILY. PLEASE CONTACT OFFICE FOR ADDITIONAL REFILLS 04/11/16   Sanda Klein, MD   nitroGLYCERIN (NITROSTAT) 0.4 MG SL tablet Place 1 tablet (0.4 mg total) under the tongue every 5 (five) minutes x 3 doses as needed for chest pain. 04/24/16   Mihai Croitoru, MD  primidone (MYSOLINE) 50 MG tablet Take 50 mg by mouth daily.    Historical Provider, MD  quinapril (ACCUPRIL) 40 MG tablet Take 20 mg by mouth at bedtime. Patient takes with 10 mg for 30 mg dose    Historical Provider, MD    Family History Family History  Problem Relation Age of Onset  . Colon cancer Father   . Hypertension Mother   . Heart disease Mother   . Cancer Mother     Social History Social History  Substance Use Topics  . Smoking status: Former Smoker    Packs/day: 0.75    Quit date: 06/11/2015  . Smokeless tobacco: Never Used  . Alcohol use No     Allergies   Ivp dye [iodinated diagnostic agents] and Plavix [clopidogrel bisulfate]   Review of Systems Review of Systems  HENT: Negative for nosebleeds.   Respiratory: Positive for shortness of breath.   Musculoskeletal: Positive for arthralgias and back pain.  Neurological: Positive for headaches. Negative for facial asymmetry.  Hematological: Does not bruise/bleed easily.  All other systems reviewed and are negative.    Physical Exam Updated Vital Signs BP (!) 174/93 (BP Location: Left Arm)   Pulse 81   Temp 97.9 F (36.6 C) (Oral)   Resp 20   SpO2 92%   Physical Exam  Constitutional: He is oriented to person, place, and time. He appears well-developed and well-nourished.  Non-toxic appearance.  HENT:  Head: Normocephalic.    Right Ear: Tympanic membrane and external ear normal.  Left Ear: Tympanic membrane and external ear normal.  Eyes: EOM and lids are normal. Pupils are equal, round, and reactive to light.  Neck: Normal range of motion. Neck supple. Carotid bruit is not present. No tracheal deviation present.  Cervical collar in place.  Cardiovascular: Normal rate, regular rhythm, normal heart sounds, intact distal  pulses and normal pulses.   Pulmonary/Chest: Breath sounds normal. No stridor. No respiratory distress.  Shallow respirations. Tachypnea of20-22/min.Rhonchi and rales left greater than right.  Pain with deep breathing and cough. Pt speaks in complete sentences. Abrasion of the left upper chest, at the left breast, and lower left anterior chest.  Abdominal: Soft. Bowel sounds are normal. There is no tenderness. There is no guarding.  Musculoskeletal:       Right wrist: He exhibits decreased range of motion, swelling and deformity.       Thoracic  back: He exhibits tenderness, bony tenderness and pain.       Back:  Deformity of the right wrist . Radial pulse 2+. Abrasion both knees. FROM of right and left shoulder, elbow, hips, knees, and ankles.  Lymphadenopathy:       Head (right side): No submandibular adenopathy present.       Head (left side): No submandibular adenopathy present.    He has no cervical adenopathy.  Neurological: He is alert and oriented to person, place, and time. He has normal strength. No cranial nerve deficit or sensory deficit.  Skin: Skin is warm and dry.  Psychiatric: He has a normal mood and affect. His speech is normal.  Nursing note and vitals reviewed.    ED Treatments / Results  Labs (all labs ordered are listed, but only abnormal results are displayed) Labs Reviewed - No data to display  EKG  EKG Interpretation None       Radiology No results found.  Procedures Procedures (including critical care time) CRITICAL CARE Performed by: Lenox Ahr Total critical care time: **40* minutes Critical care time was exclusive of separately billable procedures and treating other patients. Critical care was necessary to treat or prevent imminent or life-threatening deterioration. Critical care was time spent personally by me on the following activities: development of treatment plan with patient and/or surrogate as well as nursing, discussions with  consultants, evaluation of patient's response to treatment, examination of patient, obtaining history from patient or surrogate, ordering and performing treatments and interventions, ordering and review of laboratory studies, ordering and review of radiographic studies, pulse oximetry and re-evaluation of patient's condition.  Fracture care right wrist. Patient sustained a comminuted fracture of the right radius following a fall down 8-10 steps. I reviewed the x-ray with the patient in terms which he understands. I reviewed the process of immobilization in terms which he understands. The patient gives permission for the procedure.  Patient was identified by arm band. The patient was measured from the MP joint area to the elbow area. Volar short arm splint was then applied. After the application of the splint, the capillary refill was less than 2 seconds. There no temperature changes of the entire upper extremity. The patient reports the splint feels comfortable, he states the pain is actually improved after the area was splint. The patient was treated with intravenous on Dilaudid for assistance with his pain. Patient tolerated the procedure without problem. Medications Ordered in ED Medications  morphine 4 MG/ML injection 4 mg (not administered)  ondansetron (ZOFRAN) tablet 4 mg (not administered)     Initial Impression / Assessment and Plan / ED Course Pt seen by Dr Lacinda Axon.  I have reviewed the triage vital signs and the nursing notes.  Pertinent labs & imaging results that were available during my care of the patient were reviewed by me and considered in my medical decision making (see chart for details).     **I have reviewed nursing notes, vital signs, and all appropriate lab and imaging results for this patient.*  Final Clinical Impressions(s) / ED Diagnoses MDM Patient in a cervical collar. He arrived in the emergency department by EMS. The initial blood pressure was elevated at 174/93.  The pulse oximetry was 92% on room air. The blood pressure has improved to 155/82. The pulse oximetry is ranging from 92-95% on room air. The patient has pain with taking a deep breath. He has pain with cough. The patient was given oxygen at 2 L/m as he was  also having some tachypnea of 20-22 breaths per minute. The patient states that after pain medication and oxygen he feels like he is breathing easier.  The electrocardiogram shows a normal sinus rhythm at 82 bpm. There is an occasional premature ventricular complex present. There is some left atrial enlargement present. There is good R-wave progression throughout the electrocardiogram. No high degree blocks appreciated.  The patient has a comminuted fracture of the distal radius. The fracture is impacted dorsally with dorsal angulation. The portable chest x-ray shows some mild cardiomegaly present. There are also some minimal changes of congestive failure. There is no visible fracture or pneumothorax appreciated at this time no pleural fluid appreciated at this time. Additional pain medication given.  CT scan of the head shows no evidence of acute infarction, hemorrhage, hydrocephalus, or mass effect. There is noted a well-defined lucency that crosses the left lateral frontal bone extending to the left xiphoid bone.  There no acute fractures noted on the cervical spine x-ray, there are noted however degenerative disc disease changes present.  CT scan of the chest without contrast is negative for any pulmonary nodules, mass, or local infiltrates. There is no noted rib fractures. There is noted a comminuted T3 compression fracture with approximately 50% loss of the vertebral height. There is a small mildly retropulsed fragment that could possibly be flattening the thecal sac on. Is also a fracture through the right T3 inferior facet. Atelectasis on noted, but no other significant abnormality involving the lung. I've discussed these findings with the  patient and his wife in terms which they understand. Case discussed with Dr Lacinda Axon. Call placed to Trauma MD. Dr Redmond Pulling will accept pt at the [facility] ED. [facility] will transport.   Final diagnoses:  Closed fracture of frontal bone, initial encounter ([place])  Other closed fracture of third thoracic vertebra, initial encounter [facility])  Closed fracture of distal end of right radius, unspecified fracture morphology, initial encounter  Fall in home, initial encounter    New Prescriptions New Prescriptions   No medications on file     Lily Kocher, PA-C 12/09/16 [address] PA-C 12/10/16 [place], MD 12/10/16 [phone]

## 2016-12-09 NOTE — ED Notes (Signed)
Short arm Volar Splint applied to right wrist by Lily Kocher, PA-C.  CMS in tact.

## 2016-12-09 NOTE — ED Notes (Signed)
Pt. Given ice chips, ok per Dr. Redmond Pulling

## 2016-12-09 NOTE — ED Notes (Signed)
Report given to Lenna Sciara, RN at [facility] ED.

## 2016-12-09 NOTE — Consult Note (Signed)
Reason for Consult: Preoperative cardiovascular risk assessment  Requesting Physician: Caralyn Guile  Cardiologist: Shakelia Scrivner  HPI: This is a 66 y.o. male admitted with multiple musculoskeletal injuries following a fall down his basement steps. He has a comminuted T3 thoracic vertebral fracture, a nondisplaced fracture of the left frontal bone and a fracture of the right wrist. Surgery is planned for the latter.   Cardiovascular examination is requested due to his history of coronary artery disease and congestive heart failure (mildly depressed left ventricular systolic function, most recent EF roughly 45%).  Robert [name] has a long history of coronary problems, but has been a very stable pattern for the last 2 years. He has not required any revascularization procedures since May 2016 and is currently taking only aspirin, no other antiplatelet agents. He has not had recent problems with angina pectoris either at rest or with activity, denies shortness of breath at rest or with activity and has good functional status. There has been no need for adjustment in his chronic heart failure medications. He had successful and uncomplicated surgical repair of 3 abdominal hernias with Dr. Ninfa Linden roughly 6 months ago. He is not smoking cigarettes, having quit for roughly 2 years.  Significant noncardiac comorbid conditions include mild diabetes mellitus and extensive problems with rheumatoid arthritis on biological agents and methotrexate.  His fall today was due to a slipped foot; he denies any preceding dizziness or loss of consciousness. Although he did have head impact I think he lost consciousness even after the fall. He is having substantial pain from his back fracture and wrist fracture.  PMHx:  Past Medical History:  Diagnosis Date  . Anxiety   . COPD (chronic obstructive pulmonary disease) ([place])   . Coronary artery disease   . Full dentures   . GERD (gastroesophageal reflux disease)    Rolaids  as needed  . High cholesterol   . History of MI (myocardial infarction)   . Hypertension    med. dosage increased 04/2016; has been on BP med. x 10 yrs.  . Incarcerated umbilical hernia [id number]  . Inguinal hernia 05/2016   bilateral   . Insulin dependent diabetes mellitus ([place])   . Ischemic cardiomyopathy    a. 03/2015 EF 40-45% by LV gram.  . Rheumatoid arthritis(714.0)    Past Surgical History:  Procedure Laterality Date  . CARDIAC CATHETERIZATION N/A 02/01/2015   Procedure: Left Heart Cath and Coronary Angiography;  Surgeon: Belva Crome, MD;  Location: [facility] [facility];  Service: Cardiovascular;  Laterality: N/A;  . CARDIAC CATHETERIZATION N/A 03/22/2015   Procedure: Left Heart Cath and Coronary Angiography;  Surgeon: Leonie Man, MD;  Location: [facility];  Service: Cardiovascular;  Laterality: N/A;  . CARDIAC CATHETERIZATION  09/05/2002; 03/09/2003; 04/10/2005;06/02/2005; 05/09/2006; 02/09/2007; 03/01/2007  . CORONARY ANGIOPLASTY  11/14/2012; 02/01/2015  . CORONARY ANGIOPLASTY WITH STENT PLACEMENT  05/16/2012   "1; makes total ~ 4"  . INSERTION OF MESH Bilateral 05/24/2016   Procedure: INSERTION OF MESH;  Surgeon: Coralie Keens, MD;  Location: [facility];  Service: General;  Laterality: Bilateral;  . LAPAROSCOPIC CHOLECYSTECTOMY    . LAPAROSCOPIC INGUINAL HERNIA WITH UMBILICAL HERNIA Bilateral 05/24/2016   Procedure: BILATERAL LAPAROSCOPIC INGUINAL HERNIA REPAIR WITH MESH AND UMBILICAL HERNIA REPAIR WITH MESH;  Surgeon: Coralie Keens, MD;  Location: [place];  Service: General;  Laterality: Bilateral;  . LEFT HEART CATHETERIZATION WITH CORONARY ANGIOGRAM N/A 12/22/2011   Procedure: LEFT HEART CATHETERIZATION WITH CORONARY ANGIOGRAM;  Surgeon: Troy Sine, MD;  Location: [facility] CATH LAB;  Service: Cardiovascular;  Laterality: N/A;  . LEFT HEART CATHETERIZATION WITH CORONARY ANGIOGRAM Bilateral 05/16/2012   Procedure: LEFT HEART CATHETERIZATION WITH  CORONARY ANGIOGRAM;  Surgeon: Lorretta Harp, MD;  Location: [facility] CATH LAB;  Service: Cardiovascular;  Laterality: Bilateral;  . LEFT HEART CATHETERIZATION WITH CORONARY ANGIOGRAM N/A 11/14/2012   Procedure: LEFT HEART CATHETERIZATION WITH CORONARY ANGIOGRAM;  Surgeon: Troy Sine, MD;  Location: [facility] CATH LAB;  Service: Cardiovascular;  Laterality: N/A;  . LEFT HEART CATHETERIZATION WITH CORONARY ANGIOGRAM N/A 09/01/2013   Procedure: LEFT HEART CATHETERIZATION WITH CORONARY ANGIOGRAM;  Surgeon: Leonie Man, MD;  Location: [facility] CATH LAB;  Service: Cardiovascular;  Laterality: N/A;  . [place]; 1973; 1985   x 3  . NM MYOCAR PERF WALL MOTION  08/30/2009   No significant ischemia  . PERCUTANEOUS CORONARY INTERVENTION-BALLOON ONLY  12/22/2011   Procedure: PERCUTANEOUS CORONARY INTERVENTION-BALLOON ONLY;  Surgeon: Troy Sine, MD;  Location: [facility] CATH LAB;  Service: Cardiovascular;;  . PERCUTANEOUS CORONARY INTERVENTION-BALLOON ONLY  11/14/2012   Procedure: PERCUTANEOUS CORONARY INTERVENTION-BALLOON ONLY;  Surgeon: Troy Sine, MD;  Location: [facility] CATH LAB;  Service: Cardiovascular;;  . PERCUTANEOUS CORONARY STENT INTERVENTION (PCI-S)  05/16/2012   Procedure: PERCUTANEOUS CORONARY STENT INTERVENTION (PCI-S);  Surgeon: Lorretta Harp, MD;  Location: [facility] CATH LAB;  Service: Cardiovascular;;  . PERCUTANEOUS CORONARY STENT INTERVENTION (PCI-S)  09/01/2013   Procedure: PERCUTANEOUS CORONARY STENT INTERVENTION (PCI-S);  Surgeon: Leonie Man, MD;  Location: [facility] CATH LAB;  Service: Cardiovascular;;    FAMHx: Family History  Problem Relation Age of Onset  . Colon cancer Father   . Hypertension Mother   . Heart disease Mother   . Cancer Mother     SOCHx:  reports that he quit smoking about 17 months ago. He smoked 0.75 packs per day. He has never used smokeless tobacco. He reports that he does not drink alcohol or use drugs.  ALLERGIES: Allergies  Allergen Reactions  . Ivp Dye  [Iodinated Diagnostic Agents] Itching and Rash  . Plavix [Clopidogrel Bisulfate] Rash    ROS: Pertinent items noted in HPI and remainder of comprehensive ROS otherwise negative.  HOME MEDICATIONS: No current facility-administered medications on file prior to encounter.    Current Outpatient Prescriptions on File Prior to Encounter  Medication Sig Dispense Refill  . ALPRAZolam (XANAX) 0.5 MG tablet Take 0.5 mg by mouth 3 (three) times daily as needed. For anxiety    . aspirin EC 81 MG EC tablet Take 1 tablet (81 mg total) by mouth daily.    [name] atorvastatin (LIPITOR) 80 MG tablet Take 1 tablet (80 mg total) by mouth daily at 6 PM. 90 tablet 3  . fish oil-omega-3 fatty acids 1000 MG capsule Take 1 g by mouth daily.     . folic acid (FOLVITE) 1 MG tablet Take 1 mg by mouth daily.     [name] glipiZIDE (GLUCOTROL XL) 10 MG 24 hr tablet Take 10 mg by mouth 2 (two) times daily.    . Insulin Glargine (LANTUS SOLOSTAR) 100 UNIT/ML Solostar Pen Inject 80 Units into the skin daily at 10 pm.    . isosorbide mononitrate (IMDUR) 120 MG 24 hr tablet Take 120 mg by mouth daily.    . metFORMIN (GLUCOPHAGE) 1000 MG tablet Take 1 tablet (1,000 mg total) by mouth 2 (two) times daily with a meal.    . metoprolol tartrate (LOPRESSOR) 25 MG tablet  TAKE 1 TABLET (25 MG TOTAL) BY MOUTH 2 (TWO) TIMES DAILY. PLEASE CONTACT OFFICE FOR ADDITIONAL REFILLS 180 tablet 3  . nitroGLYCERIN (NITROSTAT) 0.4 MG SL tablet Place 1 tablet (0.4 mg total) under the tongue every 5 (five) minutes x 3 doses as needed for chest pain. 25 tablet 2  . primidone (MYSOLINE) 50 MG tablet Take 50 mg by mouth daily.    . quinapril (ACCUPRIL) 40 MG tablet Take 20 mg by mouth at bedtime. Patient takes with 10 mg for 30 mg dose    . cephALEXin (KEFLEX) 500 MG capsule Take 1 capsule (500 mg total) by mouth 2 (two) times daily. Stop taking 10/17/2016 (Patient not taking: Reported on 12/09/2016) 12 capsule 0    HOSPITAL MEDICATIONS: I have reviewed the  patient's current medications.  VITALS: Blood pressure 138/80, pulse 93, temperature 98.5 F (36.9 C), temperature source Oral, resp. rate (!) 27, SpO2 92 %.  PHYSICAL EXAM:  General: Alert, oriented x3, no distress Head: Ecchymosis and mild excoriation left forehead, PERRL, EOMI, no exophtalmos or lid lag, no myxedema, no xanthelasma; normal ears, nose and oropharynx Neck: Normal jugular venous pulsations and no hepatojugular reflux; brisk carotid pulses without delay and no carotid bruits Chest: clear to auscultation, no signs of consolidation by percussion or palpation, normal fremitus, symmetrical and full respiratory excursions Cardiovascular: normal position and quality of the apical impulse, regular rhythm, normal first heart sound and normal second heart sound, no rubs or gallops, no murmur Abdomen: no tenderness or distention, no masses by palpation, no abnormal pulsatility or arterial bruits, normal bowel sounds, no hepatosplenomegaly Extremities: Splinted/dressed right wrist ; several superficial skin lacerations on all limbs no clubbing, cyanosis;  no edema; 2+ radial, ulnar and brachial pulses bilaterally; 2+ right femoral, posterior tibial and dorsalis pedis pulses; 2+ left femoral, posterior tibial and dorsalis pedis pulses; no subclavian or femoral bruits Neurological: grossly nonfocal   LABS  CBC  Recent Labs  12/09/16 1114  WBC 12.3*  NEUTROABS 10.4*  HGB 17.6*  HCT 50.5  MCV 97.1  PLT 93*   Basic Metabolic Panel  Recent Labs  12/09/16 1114  NA 135  K 4.4  CL 102  CO2 23  GLUCOSE 278*  BUN 15  CREATININE 0.84  CALCIUM 9.2   Liver Function Tests  Recent Labs  12/09/16 1114  AST 35  ALT 38  ALKPHOS 71  BILITOT 0.7  PROT 6.7  ALBUMIN 4.0    Dg Wrist Complete Right  Result Date: 12/09/2016 CLINICAL DATA:  Fall onto outstretched right hand this morning. EXAM: RIGHT WRIST - COMPLETE 3+ VIEW COMPARISON:  None. FINDINGS: There is a comminuted  fracture of the distal radius. The primary fracture is transverse with secondary fractures extending to the radial and lunate process of the distal radial articular surface. The fracture is impacted dorsally leading to dorsal angulation of the articular surface of 21 degrees. No other fractures.  No dislocation. There is surrounding soft tissue swelling. IMPRESSION: 1. Comminuted fracture of the distal radius with dorsal impaction and dorsal angulation of the articular surface. Electronically Signed   By: Lajean Manes M.D.   On: 12/09/2016 09:39   Ct Head Wo Contrast  Result Date: 12/09/2016 CLINICAL DATA:  fell down 8 to 10 steps. No LOC. c/o headache, pain between shoulder blades, Pain with deep breath or cough. EXAM: CT HEAD WITHOUT CONTRAST CT CERVICAL SPINE WITHOUT CONTRAST TECHNIQUE: Multidetector CT imaging of the head and cervical spine was performed following the standard  protocol without intravenous contrast. Multiplanar CT image reconstructions of the cervical spine were also generated. COMPARISON:  None. FINDINGS: CT HEAD FINDINGS Brain: No evidence of acute infarction, hemorrhage, hydrocephalus, extra-axial collection or mass lesion/mass effect. There is ventricular and sulcal enlargement reflecting mild to moderate atrophy, advanced for age. Vascular: No hyperdense vessel or unexpected calcification. Skull: The well-defined lucency crosses the left lateral frontal bone extending to the left sphenoid bone. There is also a lucency across the mid left zygoma. These have the appearance of nondisplaced fractures, but may be chronic. There is no associated soft tissue swelling. No skull lesion. Sinuses/Orbits: Globes and orbits are unremarkable. There is dependent hyperattenuating fluid in the right sphenoid sinus. Mucosal thickening lines the maxillary sinuses with mild mucosal thickening lining the ethmoid air cells and inferior right frontal sinus. Clear mastoid air cells. Other: None. CT CERVICAL  SPINE FINDINGS Alignment: Normal. Skull base and vertebrae: No acute fracture. No primary bone lesion or focal pathologic process. Soft tissues and spinal canal: No prevertebral fluid or swelling. No visible canal hematoma. Disc levels: Moderate loss of disc height at Celsius 5-C6 with mild loss of disc height at C6-C7. Spondylitis bulging with uncovertebral spurring causes moderate right and mild left neural foraminal narrowing at this level. No significant central stenosis. No convincing disc herniation. Upper chest: Unremarkable. Other: None. IMPRESSION: HEAD CT 1. No acute intracranial abnormalities.  No intracranial hemorrhage. 2. Atrophy advanced for age. 3. Apparent nondisplaced left lateral frontal bone skull fracture and mid zygoma fracture. These are likely chronic given the lack of associated soft tissue swelling. CERVICAL CT 1. No fracture or acute finding. Electronically Signed   By: Lajean Manes M.D.   On: 12/09/2016 10:17   Ct Chest Wo Contrast  Result Date: 12/09/2016 CLINICAL DATA:  Pain after fall. EXAM: CT CHEST WITHOUT CONTRAST TECHNIQUE: Multidetector CT imaging of the chest was performed following the standard protocol without IV contrast. COMPARISON:  Chest x-ray from earlier today FINDINGS: Cardiovascular: Atherosclerotic changes are seen in the thoracic aorta without aneurysmal dilatation. No effusions. The central pulmonary arteries are normal. Coronary artery calcifications are identified. The heart size is mildly enlarged. Mediastinum/Nodes: The right thyroid lobe is larger than the left but within normal limits. No thyroid nodules are identified. The esophagus is normal. No adenopathy. Lungs/Pleura: The central airways are within normal limits. No pneumothorax. Scattered atelectasis in the left lung. Paraseptal emphysematous changes seen in the apices, right greater than left. No suspicious pulmonary nodules, masses, or focal infiltrates are identified. Upper Abdomen: Evaluation of  the upper abdomen is limited but unremarkable. Previous cholecystectomy. Musculoskeletal: There is a comminuted compression fracture of T3 with approximately 50% loss of vertebral body height. The posterior border of the T3 vertebral body is convex posteriorly with likely flattening of the thecal sac. There appears to be a small mildly retropulsed fragment as seen on sagittal image 99 and axial image 39. The T3 fracture also involves the right T3 inferior facet as seen on sagittal image 94. No definitive involvement of the left facet. Lucency projected through the left T3 facet on sagittal image 105 is not seen on adjacent images and may represent a nutrient foramen or artifact. The pedicles are intact. The spinous process at this level is also intact. There is no listhesis seen at this level. No other fractures are seen in the visualized spine. The remainder of the bones are intact. Specifically, no rib fractures are identified. IMPRESSION: 1. Comminuted T3 compression fracture with approximately 50%  loss of vertebral height. There is convexity to the posterior border of T3 with an apparent small mildly retropulsed fragment and probable flattening of the the thecal sac. There is also a fracture through the right T3 inferior facet. Lucency through the left T3 facet only seen on 1 image may be a nutrient foramen or artifact. 2. Scatter atelectasis in the left lung. These findings were called to Dr. Lacinda Axon in the emergency room. Electronically Signed   By: Dorise Bullion III M.D   On: 12/09/2016 10:37   Ct Cervical Spine Wo Contrast  Result Date: 12/09/2016 CLINICAL DATA:  fell down 8 to 10 steps. No LOC. c/o headache, pain between shoulder blades, Pain with deep breath or cough. EXAM: CT HEAD WITHOUT CONTRAST CT CERVICAL SPINE WITHOUT CONTRAST TECHNIQUE: Multidetector CT imaging of the head and cervical spine was performed following the standard protocol without intravenous contrast. Multiplanar CT image  reconstructions of the cervical spine were also generated. COMPARISON:  None. FINDINGS: CT HEAD FINDINGS Brain: No evidence of acute infarction, hemorrhage, hydrocephalus, extra-axial collection or mass lesion/mass effect. There is ventricular and sulcal enlargement reflecting mild to moderate atrophy, advanced for age. Vascular: No hyperdense vessel or unexpected calcification. Skull: The well-defined lucency crosses the left lateral frontal bone extending to the left sphenoid bone. There is also a lucency across the mid left zygoma. These have the appearance of nondisplaced fractures, but may be chronic. There is no associated soft tissue swelling. No skull lesion. Sinuses/Orbits: Globes and orbits are unremarkable. There is dependent hyperattenuating fluid in the right sphenoid sinus. Mucosal thickening lines the maxillary sinuses with mild mucosal thickening lining the ethmoid air cells and inferior right frontal sinus. Clear mastoid air cells. Other: None. CT CERVICAL SPINE FINDINGS Alignment: Normal. Skull base and vertebrae: No acute fracture. No primary bone lesion or focal pathologic process. Soft tissues and spinal canal: No prevertebral fluid or swelling. No visible canal hematoma. Disc levels: Moderate loss of disc height at Celsius 5-C6 with mild loss of disc height at C6-C7. Spondylitis bulging with uncovertebral spurring causes moderate right and mild left neural foraminal narrowing at this level. No significant central stenosis. No convincing disc herniation. Upper chest: Unremarkable. Other: None. IMPRESSION: HEAD CT 1. No acute intracranial abnormalities.  No intracranial hemorrhage. 2. Atrophy advanced for age. 3. Apparent nondisplaced left lateral frontal bone skull fracture and mid zygoma fracture. These are likely chronic given the lack of associated soft tissue swelling. CERVICAL CT 1. No fracture or acute finding. Electronically Signed   By: Lajean Manes M.D.   On: 12/09/2016 10:17   Dg  Chest Portable 1 View  Result Date: 12/09/2016 CLINICAL DATA:  Dyspnea after falling down 8-9 steps today. Abrasion and bleeding on the anterior left chest. EXAM: PORTABLE CHEST 1 VIEW COMPARISON:  03/20/2015. FINDINGS: Interval enlargement of the cardiac silhouette with increased prominence of the pulmonary vasculature and interstitial markings. Progressive aortic arch calcification. Coronary artery stent, better seen on the previous lateral view dated 12/30/2014. No visible fracture or pneumothorax. No pleural fluid. IMPRESSION: Interval cardiomegaly and minimal changes of congestive heart failure. Electronically Signed   By: Claudie Revering M.D.   On: 12/09/2016 09:35    ECG: Normal sinus rhythm, left axis deviation almost meeting criteria for left anterior fascicular block, no acute repolarization abnormalities, possible left atrial abnormality. A single PVC is seen  TELEMETRY:  sinus rhythm with rare PVCs  IMPRESSION: 1. Low risk for major cardiovascular complications with planned wrist surgery. Suspect there  will be conservative management of his cranial and spine injuries, but he would also be at low risk for major complications with neurosurgery as well. It is important that his chronic treatment with metoprolol 25 mg twice daily should be continued without interruption throughout the entire perioperative period, to reduce the risk of rebound complications and to reduce surgical risk.  2. CAD: Despite a very rich and long-standing history of problems with coronary disease he has had a remarkable reduction in the frequency of problems over the last 24 months. This maybe related to the fact that he finally permanent quit smoking. If aspirin needs to be stopped temporarily, this can be performed, but aspirin should be restarted as soon as possible postoperatively. 3. CHF: Lying fully flat in bed without any respiratory difficulty despite his injuries. Has not required aggressive diuretic therapy or  any recent change in his medications. Avoid excessive intravenous fluid administration. Restart ACE inhibitor as soon as possible postop, unless he has hypotension or renal insufficiency. 4. PAD: Known chronic occlusion of the left popliteal artery with collateral formation, asymptomatic 5. COPD: No recent problems with wheezing. He has quit smoking. 6. DM: Poorly controlled. Last hemoglobin A1c 9.6%. Increases his risk of delayed healing and infection complications.  7. RA: The chronic treatment with methotrexate may also have similar negative effects, but thankfully he is not taking steroids at this time.  RECOMMENDATION: There is no need for additional cardiac testing or treatment prior to the planned surgery. Please make sure that metoprolol is continued without interruption throughout the entire perioperative period. We'll follow from a distance, please call if there are any additional questions or new cardiovascular developments during his hospital stay.  Time Spent Directly with Patient: 30 minutes  Sanda Klein, MD, [facility] [phone] office [phone] pager   12/09/2016, 3:12 PM

## 2016-12-09 NOTE — ED Triage Notes (Signed)
Fall this morning down approximately 8- 10 steps.  States he slipped.  Denies LOC.  c/o pain to neck,  Upper back,  Right wrist.  Multiple abrasions.  Large abrasion to left side of chest and abdomen.

## 2016-12-10 ENCOUNTER — Encounter (HOSPITAL_COMMUNITY): Payer: Self-pay | Admitting: Anesthesiology

## 2016-12-10 ENCOUNTER — Encounter (HOSPITAL_COMMUNITY): Admission: EM | Disposition: A | Discharge status: Home Health | Payer: Self-pay | Source: Home / Self Care

## 2016-12-10 ENCOUNTER — Inpatient Hospital Stay (HOSPITAL_COMMUNITY): Payer: Medicare HMO | Admitting: Anesthesiology

## 2016-12-10 HISTORY — PX: OPEN REDUCTION INTERNAL FIXATION (ORIF) DISTAL RADIAL FRACTURE: SHX5989

## 2016-12-10 LAB — COMPREHENSIVE METABOLIC PANEL
ALT: 34 U/L (ref 17–63)
AST: 42 U/L — ABNORMAL HIGH (ref 15–41)
Albumin: 3.6 g/dL (ref 3.5–5.0)
Alkaline Phosphatase: 62 U/L (ref 38–126)
Anion gap: 8 (ref 5–15)
BILIRUBIN TOTAL: 1 mg/dL (ref 0.3–1.2)
BUN: 13 mg/dL (ref 6–20)
CHLORIDE: 102 mmol/L (ref 101–111)
CO2: 25 mmol/L (ref 22–32)
CREATININE: 0.93 mg/dL (ref 0.61–1.24)
Calcium: 8.6 mg/dL — ABNORMAL LOW (ref 8.9–10.3)
Glucose, Bld: 180 mg/dL — ABNORMAL HIGH (ref 65–99)
POTASSIUM: 3.9 mmol/L (ref 3.5–5.1)
Sodium: 135 mmol/L (ref 135–145)
TOTAL PROTEIN: 6 g/dL — AB (ref 6.5–8.1)

## 2016-12-10 LAB — GLUCOSE, CAPILLARY
GLUCOSE-CAPILLARY: 192 mg/dL — AB (ref 65–99)
GLUCOSE-CAPILLARY: 214 mg/dL — AB (ref 65–99)
Glucose-Capillary: 168 mg/dL — ABNORMAL HIGH (ref 65–99)
Glucose-Capillary: 209 mg/dL — ABNORMAL HIGH (ref 65–99)

## 2016-12-10 LAB — CBC
HEMATOCRIT: 47.7 % (ref 39.0–52.0)
Hemoglobin: 16 g/dL (ref 13.0–17.0)
MCH: 32.4 pg (ref 26.0–34.0)
MCHC: 33.5 g/dL (ref 30.0–36.0)
MCV: 96.6 fL (ref 78.0–100.0)
PLATELETS: 105 10*3/uL — AB (ref 150–400)
RBC: 4.94 MIL/uL (ref 4.22–5.81)
RDW: 15.4 % (ref 11.5–15.5)
WBC: 12.8 10*3/uL — AB (ref 4.0–10.5)

## 2016-12-10 LAB — TYPE AND SCREEN
ABO/RH(D): O POS
ANTIBODY SCREEN: NEGATIVE

## 2016-12-10 LAB — SURGICAL PCR SCREEN
MRSA, PCR: NEGATIVE
Staphylococcus aureus: NEGATIVE

## 2016-12-10 LAB — ABO/RH: ABO/RH(D): O POS

## 2016-12-10 SURGERY — OPEN REDUCTION INTERNAL FIXATION (ORIF) DISTAL RADIUS FRACTURE
Anesthesia: General | Laterality: Right

## 2016-12-10 MED ORDER — ALBUTEROL SULFATE (2.5 MG/3ML) 0.083% IN NEBU
2.5000 mg | INHALATION_SOLUTION | Freq: Once | RESPIRATORY_TRACT | Status: AC
  Administered 2016-12-10: 2.5 mg via RESPIRATORY_TRACT

## 2016-12-10 MED ORDER — HYDROMORPHONE HCL 1 MG/ML IJ SOLN
INTRAMUSCULAR | Status: AC
  Administered 2016-12-10: 11:00:00
  Filled 2016-12-10: qty 0.5

## 2016-12-10 MED ORDER — CEFAZOLIN SODIUM-DEXTROSE 2-3 GM-% IV SOLR
INTRAVENOUS | Status: DC | PRN
  Administered 2016-12-10: 2 g via INTRAVENOUS

## 2016-12-10 MED ORDER — CEFAZOLIN IN D5W 1 GM/50ML IV SOLN
1.0000 g | Freq: Three times a day (TID) | INTRAVENOUS | Status: AC
  Administered 2016-12-10 – 2016-12-11 (×3): 1 g via INTRAVENOUS
  Filled 2016-12-10 (×3): qty 50

## 2016-12-10 MED ORDER — 0.9 % SODIUM CHLORIDE (POUR BTL) OPTIME
TOPICAL | Status: DC | PRN
  Administered 2016-12-10: 400 mL

## 2016-12-10 MED ORDER — CEFAZOLIN SODIUM 1 G IJ SOLR
INTRAMUSCULAR | Status: AC
  Filled 2016-12-10: qty 20

## 2016-12-10 MED ORDER — LACTATED RINGERS IV SOLN
INTRAVENOUS | Status: DC | PRN
  Administered 2016-12-10: 07:00:00 via INTRAVENOUS

## 2016-12-10 MED ORDER — HYDROMORPHONE HCL 1 MG/ML IJ SOLN
0.2500 mg | INTRAMUSCULAR | Status: DC | PRN
  Administered 2016-12-10: 0.5 mg via INTRAVENOUS

## 2016-12-10 MED ORDER — PROMETHAZINE HCL 25 MG/ML IJ SOLN: 6.2500 mg | INTRAMUSCULAR | Status: DC | PRN

## 2016-12-10 MED ORDER — ENOXAPARIN SODIUM 40 MG/0.4ML ~~LOC~~ SOLN
40.0000 mg | SUBCUTANEOUS | Status: DC
  Administered 2016-12-11 – 2016-12-13 (×3): 40 mg via SUBCUTANEOUS
  Filled 2016-12-10 (×3): qty 0.4

## 2016-12-10 MED ORDER — METOPROLOL TARTRATE 5 MG/5ML IV SOLN
INTRAVENOUS | Status: DC | PRN
  Administered 2016-12-10: 5 mg via INTRAVENOUS

## 2016-12-10 MED ORDER — LIDOCAINE 2% (20 MG/ML) 5 ML SYRINGE
INTRAMUSCULAR | Status: AC
  Filled 2016-12-10: qty 5

## 2016-12-10 MED ORDER — ONDANSETRON HCL 4 MG/2ML IJ SOLN
INTRAMUSCULAR | Status: AC
  Filled 2016-12-10: qty 2

## 2016-12-10 MED ORDER — ALPRAZOLAM 0.25 MG PO TABS
ORAL_TABLET | ORAL | Status: AC
  Administered 2016-12-10: 11:00:00
  Filled 2016-12-10: qty 2

## 2016-12-10 MED ORDER — PROPOFOL 500 MG/50ML IV EMUL
INTRAVENOUS | Status: DC | PRN
Start: 2016-12-10 — End: 2016-12-10
  Administered 2016-12-10: 75 ug/kg/min via INTRAVENOUS

## 2016-12-10 MED ORDER — ALBUTEROL SULFATE (2.5 MG/3ML) 0.083% IN NEBU
INHALATION_SOLUTION | RESPIRATORY_TRACT | Status: AC
  Administered 2016-12-10: 11:00:00
  Filled 2016-12-10: qty 3

## 2016-12-10 MED ORDER — FENTANYL CITRATE (PF) 250 MCG/5ML IJ SOLN
INTRAMUSCULAR | Status: AC
  Filled 2016-12-10: qty 5

## 2016-12-10 MED ORDER — MIDAZOLAM HCL 2 MG/2ML IJ SOLN
INTRAMUSCULAR | Status: AC
  Filled 2016-12-10: qty 2

## 2016-12-10 MED ORDER — ONDANSETRON HCL 4 MG/2ML IJ SOLN
INTRAMUSCULAR | Status: DC | PRN
  Administered 2016-12-10: 4 mg via INTRAVENOUS

## 2016-12-10 MED ORDER — CEFAZOLIN IN D5W 1 GM/50ML IV SOLN: 1.0000 g | Freq: Three times a day (TID) | INTRAVENOUS | Status: DC

## 2016-12-10 MED ORDER — METOPROLOL TARTRATE 5 MG/5ML IV SOLN
INTRAVENOUS | Status: AC
  Filled 2016-12-10: qty 5

## 2016-12-10 MED ORDER — ROPIVACAINE HCL 5 MG/ML IJ SOLN
INTRAMUSCULAR | Status: DC | PRN
  Administered 2016-12-10: 30 mL via PERINEURAL

## 2016-12-10 MED ORDER — FENTANYL CITRATE (PF) 100 MCG/2ML IJ SOLN
INTRAMUSCULAR | Status: DC | PRN
Start: 2016-12-10 — End: 2016-12-10
  Administered 2016-12-10 (×5): 50 ug via INTRAVENOUS

## 2016-12-10 SURGICAL SUPPLY — 62 items
BANDAGE ACE 4X5 VEL STRL LF (GAUZE/BANDAGES/DRESSINGS) ×3 IMPLANT
BANDAGE ELASTIC 3 VELCRO ST LF (GAUZE/BANDAGES/DRESSINGS) ×3 IMPLANT
BIT DRILL 2.2 SS TIBIAL (BIT) ×2 IMPLANT
BLADE CLIPPER SURG (BLADE) IMPLANT
BNDG CMPR 9X4 STRL LF SNTH (GAUZE/BANDAGES/DRESSINGS) ×1
BNDG ESMARK 4X9 LF (GAUZE/BANDAGES/DRESSINGS) ×3 IMPLANT
BNDG GAUZE ELAST 4 BULKY (GAUZE/BANDAGES/DRESSINGS) ×3 IMPLANT
CANISTER SUCT 3000ML PPV (MISCELLANEOUS) ×3 IMPLANT
CORDS BIPOLAR (ELECTRODE) ×3 IMPLANT
COVER SURGICAL LIGHT HANDLE (MISCELLANEOUS) ×3 IMPLANT
CUFF TOURNIQUET SINGLE 18IN (TOURNIQUET CUFF) ×3 IMPLANT
CUFF TOURNIQUET SINGLE 24IN (TOURNIQUET CUFF) IMPLANT
DECANTER SPIKE VIAL GLASS SM (MISCELLANEOUS) ×3 IMPLANT
DRAPE OEC MINIVIEW 54X84 (DRAPES) ×3 IMPLANT
DRAPE SURG 17X11 SM STRL (DRAPES) ×3 IMPLANT
DRSG ADAPTIC 3X8 NADH LF (GAUZE/BANDAGES/DRESSINGS) ×3 IMPLANT
GAUZE SPONGE 4X4 12PLY STRL (GAUZE/BANDAGES/DRESSINGS) ×3 IMPLANT
GAUZE SPONGE 4X4 12PLY STRL LF (GAUZE/BANDAGES/DRESSINGS) ×2 IMPLANT
GAUZE SPONGE 4X4 16PLY XRAY LF (GAUZE/BANDAGES/DRESSINGS) ×3 IMPLANT
GLOVE BIOGEL PI IND STRL 8.5 (GLOVE) ×1 IMPLANT
GLOVE BIOGEL PI INDICATOR 8.5 (GLOVE) ×2
GLOVE SURG ORTHO 8.0 STRL STRW (GLOVE) ×3 IMPLANT
GOWN STRL REUS W/ TWL LRG LVL3 (GOWN DISPOSABLE) ×1 IMPLANT
GOWN STRL REUS W/ TWL XL LVL3 (GOWN DISPOSABLE) ×1 IMPLANT
GOWN STRL REUS W/TWL LRG LVL3 (GOWN DISPOSABLE) ×3
GOWN STRL REUS W/TWL XL LVL3 (GOWN DISPOSABLE) ×3
K-WIRE 1.6 (WIRE) ×3
K-WIRE FX5X1.6XNS BN SS (WIRE) ×1
KIT BASIN OR (CUSTOM PROCEDURE TRAY) ×3 IMPLANT
KIT ROOM TURNOVER OR (KITS) ×3 IMPLANT
KWIRE FX5X1.6XNS BN SS (WIRE) IMPLANT
NDL HYPO 25X1 1.5 SAFETY (NEEDLE) ×1 IMPLANT
NEEDLE HYPO 25X1 1.5 SAFETY (NEEDLE) ×3 IMPLANT
NS IRRIG 1000ML POUR BTL (IV SOLUTION) ×3 IMPLANT
PACK ORTHO EXTREMITY (CUSTOM PROCEDURE TRAY) ×3 IMPLANT
PAD ARMBOARD 7.5X6 YLW CONV (MISCELLANEOUS) ×6 IMPLANT
PAD CAST 4YDX4 CTTN HI CHSV (CAST SUPPLIES) ×1 IMPLANT
PADDING CAST COTTON 4X4 STRL (CAST SUPPLIES) ×3
PLATE RIGHT VOLAR RIM DVR (Plate) ×2 IMPLANT
SCREW LOCK 16X2.7X 3 LD TPR (Screw) IMPLANT
SCREW LOCK 18X2.7X 3 LD TPR (Screw) IMPLANT
SCREW LOCK 22X2.7X 3 LD TPR (Screw) IMPLANT
SCREW LOCKING 2.7X16 (Screw) ×6 IMPLANT
SCREW LOCKING 2.7X18 (Screw) ×6 IMPLANT
SCREW LOCKING 2.7X22MM (Screw) ×3 IMPLANT
SCREW MULTI DIRECTIONAL 2.7X22 (Screw) ×2 IMPLANT
SCREW MULTI DIRECTIONAL 2.7X24 (Screw) ×4 IMPLANT
SCREW MULTI DIRECTIONAL 2.7X26 (Screw) ×4 IMPLANT
SCREW NONLOCK 2.7X30MM (Screw) ×4 IMPLANT
SOAP 2 % CHG 4 OZ (WOUND CARE) ×3 IMPLANT
SPLINT FIBERGLASS 3X35 (CAST SUPPLIES) ×2 IMPLANT
SPONGE LAP 4X18 X RAY DECT (DISPOSABLE) ×3 IMPLANT
SUT PROLENE 4 0 PS 2 18 (SUTURE) ×4 IMPLANT
SUT VIC AB 2-0 FS1 27 (SUTURE) ×2 IMPLANT
SUT VICRYL 4-0 PS2 18IN ABS (SUTURE) ×2 IMPLANT
SYR CONTROL 10ML LL (SYRINGE) IMPLANT
TOWEL OR 17X24 6PK STRL BLUE (TOWEL DISPOSABLE) ×3 IMPLANT
TOWEL OR 17X26 10 PK STRL BLUE (TOWEL DISPOSABLE) ×3 IMPLANT
TUBE CONNECTING 12'X1/4 (SUCTIONS) ×1
TUBE CONNECTING 12X1/4 (SUCTIONS) ×2 IMPLANT
WATER STERILE IRR 1000ML POUR (IV SOLUTION) ×3 IMPLANT
YANKAUER SUCT BULB TIP NO VENT (SUCTIONS) IMPLANT

## 2016-12-10 NOTE — Op Note (Signed)
PREOPERATIVE DIAGNOSIS: Comminuted right wrist intra-articular distal radius fracture 3 more fragments Rheumatoid arthritis Diabetes mellitus  POSTOPERATIVE DIAGNOSIS: Same  ATTENDING SURGEON: Dr. Iran Planas who was scrubbed and present for the entire procedure  ASSISTANT SURGEON: None   ANESTHESIA: Supraclavicular block with monitored anesthesia care  OPERATIVE PROCEDURE: #1. Open reduction and internal fixation of displaced intra-articular distal radius fracture 3 or more fragments  #2. Right wrist brachia radialis tendon release and lengthening  #3. Radiographs 3 views right wrist  IMPLANTS: [employer] DVR cross lock plate, volar rim, standard  RADIOGRAPHIC INTERPRETATION: AP lateral oblique views of the right wrist interpreted by me do show the volar plate fixation with good restoration of the radial height inclination and tilt with the dorsal ulnar comminution noted  SURGICAL INDICATIONS: [name] is a right-hand dominant gentleman who fell going down some stairs at home. Patient sustained a comminuted intra-articular distal radius fracture is recommended he undergo the above procedure. Risks benefits and alternatives discussed in detail with the patient and signed informed consent was obtained. Risks include but not limited to bleeding infection damage to nearby nerves or arteries or tendons nonunion malunion and hardware failure and need for further surgical intervention.  SURGICAL TECHNIQUE: Patient is probably identified in the preoperative holding area marked with a permanent marker made on the right wrist indicates correct operative site. Patient brought back to the operating room where the IV sedation was administered. Patient tolerates well preoperative antibiotics were given. A well-padded tourniquet was then placed on the right brachium and stable to 1000 drape. The right upper extremity was then prepped and draped in normal sterile fashion. A timeout was called a correct  site was identified and the procedure was then begun. Attention was then turned to the right wrist. A large longitudinal incision was then made directly over the FCR sheath. Dissection carried down through the FCR sheath for the FPL was then swept out of the way. The pronator quadratus was then elevated in an L-shaped fashion. The fracture site was then exposed along the ulnar column. In order to expose the radial column the brachial radialis was then carefully release and tendon tenotomy and lengthening was then carried out of the brachia radialis. After form flexor tenotomy the wound was thoroughly irrigated the fracture site was reduced and held in place with reduction clamps. Following this position was then confirmed using the mini C-arm. The volar rim plate was then applied along the ulnar column. It was held distally with K wires and position was then confirmed using the mini C-arm. Once this is carried out a combination of locking screws and multidirectional screws were then placed distally to support the lunate and central and radial columns. The shaft fixation was then carried out with accommodation of locking and nonlocking screws. The wound was then thoroughly irrigated this was an intra-articular fracture 3 more fragments. The final radiographs are then obtained. The pronator quadratus was then closed with 2-0 Vicryl. Subcutaneous tissues closed with 4-0 Vicryl and the skin closed with simple 4-0 Prolene. Adaptic dressing sterile compressive bandage applied. The patient was then placed in a well-padded sugar tong splint taken to recovery a sugar tong splint and taken recovery room in good condition. Patient tolerated the procedure well.  POSTOPERATIVE PLAN: Patient be admitted back to the trauma service. Ice and elevation nonweightbearing the right upper extremity. He will need to follow up with me in my office in approximately 2 weeks. At that point we'll take his splint off re-x-ray  his wrist likely  place him into a short arm cast. We'll put an outpatient therapy order him an order at the first postoperative visit and then begin a postoperative ORIF protocol to 4 week mark. Radiographs of the wrist at each visit.

## 2016-12-10 NOTE — Anesthesia Procedure Notes (Addendum)
Anesthesia Regional Block: Interscalene brachial plexus block   Pre-Anesthetic Checklist: ,, timeout performed, Correct Patient, Correct Site, Correct Laterality, Correct Procedure, Correct Position, site marked, Risks and benefits discussed,  Surgical consent,  Pre-op evaluation,  At surgeon's request and post-op pain management  Laterality: Right  Prep: chloraprep       Needles:  Injection technique: Single-shot  Needle Type: Echogenic Stimulator Needle     Needle Length: 5cm  Needle Gauge: 22     Additional Needles:   Procedures: ultrasound guided, nerve stimulator,,,,,,   Nerve Stimulator or Paresthesia:  Response: bicep contraction, 0.45 mA,   Additional Responses:   Narrative:  Start time: 12/10/2016 7:29 AM End time: 12/10/2016 8:39 AM Injection made incrementally with aspirations every 5 mL.  Performed by: Personally  Anesthesiologist: Duane Boston  Additional Notes: Functioning IV was confirmed and monitors applied.  A 47mm 22ga echogenic arrow stimulator was used. Sterile prep and drape,hand hygiene and sterile gloves were used.Ultrasound guidance: relevant anatomy identified, needle position confirmed, local anesthetic spread visualized around nerve(s)., vascular puncture avoided.  Image printed for medical record.  Negative aspiration and negative test dose prior to incremental administration of local anesthetic. The patient tolerated the procedure well.

## 2016-12-10 NOTE — Transfer of Care (Signed)
Immediate Anesthesia Transfer of Care Note  Patient: KEANTHONY POOLE  Procedure(s) Performed: Procedure(s): OPEN REDUCTION INTERNAL FIXATION (ORIF) DISTAL RADIAL FRACTURE (Right)  Patient Location: PACU  Anesthesia Type:MAC, Regional and MAC combined with regional for post-op pain  Level of Consciousness: awake, sedated, patient cooperative and responds to stimulation  Airway & Oxygen Therapy: Patient Spontanous Breathing and Patient connected to nasal cannula oxygen  Post-op Assessment: Report given to RN, Post -op Vital signs reviewed and stable, Patient moving all extremities and Patient moving all extremities X 4  Post vital signs: Reviewed and stable  Last Vitals:  Vitals:   12/09/16 2011 12/10/16 0540  BP: 135/77 (!) 141/70  Pulse: (!) 102 93  Resp:    Temp: 36.8 C 36.8 C    Last Pain:  Vitals:   12/10/16 0540  TempSrc: Oral  PainSc:       Patients Stated Pain Goal: 2 (11/57/26 2035)  Complications: No apparent anesthesia complications

## 2016-12-10 NOTE — Progress Notes (Addendum)
Pt in surgery at this time. Reviewed notes from earlier, pt appears to be doing well from neuro standpoint. Please call neurosurgery when he is back in room for update.

## 2016-12-10 NOTE — Anesthesia Procedure Notes (Signed)
Procedure Name: MAC Date/Time: 12/10/2016 8:07 AM Performed by: Jacquiline Doe A Pre-anesthesia Checklist: Patient identified, Emergency Drugs available, Suction available and Patient being monitored Patient Re-evaluated:Patient Re-evaluated prior to inductionOxygen Delivery Method: Simple face mask and Nasal cannula Intubation Type: IV induction Airway Equipment and Method: Oral airway Placement Confirmation: positive ETCO2 Dental Injury: Teeth and Oropharynx as per pre-operative assessment

## 2016-12-10 NOTE — Progress Notes (Signed)
Patient ID: Robert [name], male   DOB: 1950-12-06, 66 y.o.   MRN: [id number]   Day of Surgery   Subjective: Just back from ORIF of right wrist fracture. Alert and comfortable. Denies pain or shortness of breath.  Objective: Vital signs in last 24 hours: Temp:  [97.3 F (36.3 C)-98.5 F (36.9 C)] 97.3 F (36.3 C) (04/01 1050) Pulse Rate:  [73-103] 90 (04/01 1050) Resp:  [15-27] 19 (04/01 1050) BP: (118-171)/(70-96) 171/72 (04/01 1045) SpO2:  [88 %-94 %] 91 % (04/01 1050) Weight:  [117 kg (258 lb)] 117 kg (258 lb) (04/01 0513) Last BM Date: 12/09/16  Intake/Output from previous day: 03/31 0701 - 04/01 0700 In: 173.3 [P.O.:75; I.V.:98.3] Out: 1650 [Urine:1650] Intake/Output this shift: Total I/O In: 500 [I.V.:500] Out: 5 [Blood:5]  General appearance: alert, cooperative and no distress Head: No swelling or bruising or other obvious abnormality Neck: Extended brace in place Resp: clear to auscultation bilaterally and Without increased work of breathing GI: normal findings: soft, non-tender Extremities: Cast in place right upper extremity. Well perfused Neurologic: Alert and oriented X 3, normal strength in extremities 4 with right upper extremity limited by surgery/injury  Lab Results:   Recent Labs  12/09/16 1114 12/10/16 0500  WBC 12.3* 12.8*  HGB 17.6* 16.0  HCT 50.5 47.7  PLT 93* 105*   BMET  Recent Labs  12/09/16 1114 12/10/16 0500  NA 135 135  K 4.4 3.9  CL 102 102  CO2 23 25  GLUCOSE 278* 180*  BUN 15 13  CREATININE 0.84 0.93  CALCIUM 9.2 8.6*     Studies/Results: Dg Wrist Complete Right  Result Date: 12/09/2016 CLINICAL DATA:  Fall onto outstretched right hand this morning. EXAM: RIGHT WRIST - COMPLETE 3+ VIEW COMPARISON:  None. FINDINGS: There is a comminuted fracture of the distal radius. The primary fracture is transverse with secondary fractures extending to the radial and lunate process of the distal radial articular surface. The fracture  is impacted dorsally leading to dorsal angulation of the articular surface of 21 degrees. No other fractures.  No dislocation. There is surrounding soft tissue swelling. IMPRESSION: 1. Comminuted fracture of the distal radius with dorsal impaction and dorsal angulation of the articular surface. Electronically Signed   By: Lajean Manes M.D.   On: 12/09/2016 09:39   Ct Head Wo Contrast  Result Date: 12/09/2016 CLINICAL DATA:  fell down 8 to 10 steps. No LOC. c/o headache, pain between shoulder blades, Pain with deep breath or cough. EXAM: CT HEAD WITHOUT CONTRAST CT CERVICAL SPINE WITHOUT CONTRAST TECHNIQUE: Multidetector CT imaging of the head and cervical spine was performed following the standard protocol without intravenous contrast. Multiplanar CT image reconstructions of the cervical spine were also generated. COMPARISON:  None. FINDINGS: CT HEAD FINDINGS Brain: No evidence of acute infarction, hemorrhage, hydrocephalus, extra-axial collection or mass lesion/mass effect. There is ventricular and sulcal enlargement reflecting mild to moderate atrophy, advanced for age. Vascular: No hyperdense vessel or unexpected calcification. Skull: The well-defined lucency crosses the left lateral frontal bone extending to the left sphenoid bone. There is also a lucency across the mid left zygoma. These have the appearance of nondisplaced fractures, but may be chronic. There is no associated soft tissue swelling. No skull lesion. Sinuses/Orbits: Globes and orbits are unremarkable. There is dependent hyperattenuating fluid in the right sphenoid sinus. Mucosal thickening lines the maxillary sinuses with mild mucosal thickening lining the ethmoid air cells and inferior right frontal sinus. Clear mastoid air cells. Other: None.  CT CERVICAL SPINE FINDINGS Alignment: Normal. Skull base and vertebrae: No acute fracture. No primary bone lesion or focal pathologic process. Soft tissues and spinal canal: No prevertebral fluid or  swelling. No visible canal hematoma. Disc levels: Moderate loss of disc height at Celsius 5-C6 with mild loss of disc height at C6-C7. Spondylitis bulging with uncovertebral spurring causes moderate right and mild left neural foraminal narrowing at this level. No significant central stenosis. No convincing disc herniation. Upper chest: Unremarkable. Other: None. IMPRESSION: HEAD CT 1. No acute intracranial abnormalities.  No intracranial hemorrhage. 2. Atrophy advanced for age. 3. Apparent nondisplaced left lateral frontal bone skull fracture and mid zygoma fracture. These are likely chronic given the lack of associated soft tissue swelling. CERVICAL CT 1. No fracture or acute finding. Electronically Signed   By: Lajean Manes M.D.   On: 12/09/2016 10:17   Ct Chest Wo Contrast  Result Date: 12/09/2016 CLINICAL DATA:  Pain after fall. EXAM: CT CHEST WITHOUT CONTRAST TECHNIQUE: Multidetector CT imaging of the chest was performed following the standard protocol without IV contrast. COMPARISON:  Chest x-ray from earlier today FINDINGS: Cardiovascular: Atherosclerotic changes are seen in the thoracic aorta without aneurysmal dilatation. No effusions. The central pulmonary arteries are normal. Coronary artery calcifications are identified. The heart size is mildly enlarged. Mediastinum/Nodes: The right thyroid lobe is larger than the left but within normal limits. No thyroid nodules are identified. The esophagus is normal. No adenopathy. Lungs/Pleura: The central airways are within normal limits. No pneumothorax. Scattered atelectasis in the left lung. Paraseptal emphysematous changes seen in the apices, right greater than left. No suspicious pulmonary nodules, masses, or focal infiltrates are identified. Upper Abdomen: Evaluation of the upper abdomen is limited but unremarkable. Previous cholecystectomy. Musculoskeletal: There is a comminuted compression fracture of T3 with approximately 50% loss of vertebral body  height. The posterior border of the T3 vertebral body is convex posteriorly with likely flattening of the thecal sac. There appears to be a small mildly retropulsed fragment as seen on sagittal image 99 and axial image 39. The T3 fracture also involves the right T3 inferior facet as seen on sagittal image 94. No definitive involvement of the left facet. Lucency projected through the left T3 facet on sagittal image 105 is not seen on adjacent images and may represent a nutrient foramen or artifact. The pedicles are intact. The spinous process at this level is also intact. There is no listhesis seen at this level. No other fractures are seen in the visualized spine. The remainder of the bones are intact. Specifically, no rib fractures are identified. IMPRESSION: 1. Comminuted T3 compression fracture with approximately 50% loss of vertebral height. There is convexity to the posterior border of T3 with an apparent small mildly retropulsed fragment and probable flattening of the the thecal sac. There is also a fracture through the right T3 inferior facet. Lucency through the left T3 facet only seen on 1 image may be a nutrient foramen or artifact. 2. Scatter atelectasis in the left lung. These findings were called to Dr. Lacinda Axon in the emergency room. Electronically Signed   By: Dorise Bullion III M.D   On: 12/09/2016 10:37   Ct Cervical Spine Wo Contrast  Result Date: 12/09/2016 CLINICAL DATA:  fell down 8 to 10 steps. No LOC. c/o headache, pain between shoulder blades, Pain with deep breath or cough. EXAM: CT HEAD WITHOUT CONTRAST CT CERVICAL SPINE WITHOUT CONTRAST TECHNIQUE: Multidetector CT imaging of the head and cervical spine was performed  following the standard protocol without intravenous contrast. Multiplanar CT image reconstructions of the cervical spine were also generated. COMPARISON:  None. FINDINGS: CT HEAD FINDINGS Brain: No evidence of acute infarction, hemorrhage, hydrocephalus, extra-axial collection  or mass lesion/mass effect. There is ventricular and sulcal enlargement reflecting mild to moderate atrophy, advanced for age. Vascular: No hyperdense vessel or unexpected calcification. Skull: The well-defined lucency crosses the left lateral frontal bone extending to the left sphenoid bone. There is also a lucency across the mid left zygoma. These have the appearance of nondisplaced fractures, but may be chronic. There is no associated soft tissue swelling. No skull lesion. Sinuses/Orbits: Globes and orbits are unremarkable. There is dependent hyperattenuating fluid in the right sphenoid sinus. Mucosal thickening lines the maxillary sinuses with mild mucosal thickening lining the ethmoid air cells and inferior right frontal sinus. Clear mastoid air cells. Other: None. CT CERVICAL SPINE FINDINGS Alignment: Normal. Skull base and vertebrae: No acute fracture. No primary bone lesion or focal pathologic process. Soft tissues and spinal canal: No prevertebral fluid or swelling. No visible canal hematoma. Disc levels: Moderate loss of disc height at Celsius 5-C6 with mild loss of disc height at C6-C7. Spondylitis bulging with uncovertebral spurring causes moderate right and mild left neural foraminal narrowing at this level. No significant central stenosis. No convincing disc herniation. Upper chest: Unremarkable. Other: None. IMPRESSION: HEAD CT 1. No acute intracranial abnormalities.  No intracranial hemorrhage. 2. Atrophy advanced for age. 3. Apparent nondisplaced left lateral frontal bone skull fracture and mid zygoma fracture. These are likely chronic given the lack of associated soft tissue swelling. CERVICAL CT 1. No fracture or acute finding. Electronically Signed   By: Lajean Manes M.D.   On: 12/09/2016 10:17   Ct Wrist Right Wo Contrast  Result Date: 12/09/2016 CLINICAL DATA:  Status post fall down stairs, with known comminuted fracture of the distal radius. Further evaluation requested. Initial  encounter. EXAM: CT OF THE RIGHT WRIST WITHOUT CONTRAST TECHNIQUE: Multidetector CT imaging of the right wrist was performed according to the standard protocol. Multiplanar CT image reconstructions were also generated. COMPARISON:  Right wrist radiographs performed earlier today at 9:21 a.m. FINDINGS: Bones/Joint/Cartilage There is a comminuted impacted fracture involving the distal radial metaphysis, with up to 7 mm of depression at the joint space. Mild dorsal angulation is again noted, with dorsal displacement of most fragments. There also appears to be a minimally displaced fracture through the tip of the trapezial ridge, at the insertion of the retinaculum. No additional fractures are seen.  The ulnar styloid appears intact. Mild subcortical cystic change is noted at the distal radius and ulna, and at the lunate. A large cyst is noted involving much of the scaphoid, with mild associated sclerosis. The cartilage is not well assessed on CT. An underlying joint effusion at the wrist is difficult to fully characterize. Ligaments Suboptimally assessed by CT. Muscles and Tendons The visualized musculature and tendon structures are grossly unremarkable. The carpal tunnel appears grossly intact. Flexor and extensor tendons are grossly unremarkable in appearance. Soft tissues Mild soft tissue edema is noted about the fracture site. No significant soft tissue hematoma is seen. IMPRESSION: 1. Comminuted impacted fracture involving the distal radial metaphysis, with up to 7 mm of depression at the joint space. Mild dorsal angulation again noted, with dorsal displacement of most fragments. 2. Minimally displaced fracture through the tip of the trapezial ridge, at the insertion of the retinaculum. 3. Large cyst involving much of the scaphoid, with mild  associated sclerosis. Mild subcortical cystic change at the distal radius and ulna, and at the lunate. 4. Mild soft tissue edema about the fracture site. Electronically Signed    By: Garald Balding M.D.   On: 12/09/2016 20:07   Dg Chest Portable 1 View  Result Date: 12/09/2016 CLINICAL DATA:  Dyspnea after falling down 8-9 steps today. Abrasion and bleeding on the anterior left chest. EXAM: PORTABLE CHEST 1 VIEW COMPARISON:  03/20/2015. FINDINGS: Interval enlargement of the cardiac silhouette with increased prominence of the pulmonary vasculature and interstitial markings. Progressive aortic arch calcification. Coronary artery stent, better seen on the previous lateral view dated 12/30/2014. No visible fracture or pneumothorax. No pleural fluid. IMPRESSION: Interval cardiomegaly and minimal changes of congestive heart failure. Electronically Signed   By: Claudie Revering M.D.   On: 12/09/2016 09:35    Anti-infectives: Anti-infectives    Start     Dose/Rate Route Frequency Ordered Stop   12/10/16 1600  ceFAZolin (ANCEF) IVPB 1 g/50 mL premix     1 g 100 mL/hr over 30 Minutes Intravenous Every 8 hours 12/10/16 1112 12/11/16 1559   12/10/16 1400  ceFAZolin (ANCEF) IVPB 1 g/50 mL premix  Status:  Discontinued     1 g 100 mL/hr over 30 Minutes Intravenous Every 8 hours 12/10/16 1110 12/10/16 1112   12/10/16 0600  ceFAZolin (ANCEF) IVPB 2g/100 mL premix  Status:  Discontinued     2 g 200 mL/hr over 30 Minutes Intravenous On call to O.R. 12/09/16 1624 12/10/16 1055      Assessment/Plan:  s/p fall L frontal skull fracture T3 comminuted Fx with loss of height-now in extended neck brace, no surgery planned Comminuted Rt radius fx-status post ORIF Multiple abrasions HTN CAD DM2 obese on insulin Tobacco use RA Morbid obesity  s/p Procedure(s): OPEN REDUCTION INTERNAL FIXATION (ORIF) DISTAL RADIAL FRACTURE  Neuro following. Start full liquid diet OT/PT    LOS: 1 day    Lashaundra Lehrmann T 12/10/2016

## 2016-12-10 NOTE — Consult Note (Signed)
Reason for Consult:RIGHT DISTAL RADIUS FRACTURE Referring Physician: DR. [name] is an 66 y.o. male.  HPI: [name] is a right-hand dominant gentleman with hypertension coronary artery disease reflux disease type 2 diabetes and rheumatoid arthritis who fell down his basement stairs which was about 8 feet. Denies any loss of consciousness. Patient had immediate back and wrist pain. Patient was taken [facility] and evaluated and transferred to [facility] for definitive treatment. Patient was noted to have the thoracic spine fracture skull fracture and a right wrist fracture. I was consult to for evaluation of the right wrist fracture. No previous injury to the right wrist.  Past Medical History:  Diagnosis Date  . Anxiety   . COPD (chronic obstructive pulmonary disease) ([place])   . Coronary artery disease   . Full dentures   . GERD (gastroesophageal reflux disease)    Rolaids as needed  . High cholesterol   . History of MI (myocardial infarction)   . Hypertension    med. dosage increased 04/2016; has been on BP med. x 10 yrs.  . Incarcerated umbilical hernia 16/1096  . Inguinal hernia 05/2016   bilateral   . Insulin dependent diabetes mellitus ([place])   . Ischemic cardiomyopathy    a. 03/2015 EF 40-45% by LV gram.  . Rheumatoid arthritis(714.0)     Past Surgical History:  Procedure Laterality Date  . CARDIAC CATHETERIZATION N/A 02/01/2015   Procedure: Left Heart Cath and Coronary Angiography;  Surgeon: Belva Crome, MD;  Location: [place] [facility];  Service: Cardiovascular;  Laterality: N/A;  . CARDIAC CATHETERIZATION N/A 03/22/2015   Procedure: Left Heart Cath and Coronary Angiography;  Surgeon: Leonie Man, MD;  Location: [place] [facility];  Service: Cardiovascular;  Laterality: N/A;  . CARDIAC CATHETERIZATION  09/05/2002; 03/09/2003; 04/10/2005;06/02/2005; 05/09/2006; 02/09/2007; 03/01/2007  . CORONARY ANGIOPLASTY  11/14/2012; 02/01/2015  . CORONARY ANGIOPLASTY  WITH STENT PLACEMENT  05/16/2012   "1; makes total ~ 4"  . INSERTION OF MESH Bilateral 05/24/2016   Procedure: INSERTION OF MESH;  Surgeon: Coralie Keens, MD;  Location: [place];  Service: General;  Laterality: Bilateral;  . LAPAROSCOPIC CHOLECYSTECTOMY    . LAPAROSCOPIC INGUINAL HERNIA WITH UMBILICAL HERNIA Bilateral 05/24/2016   Procedure: BILATERAL LAPAROSCOPIC INGUINAL HERNIA REPAIR WITH MESH AND UMBILICAL HERNIA REPAIR WITH MESH;  Surgeon: Coralie Keens, MD;  Location: [place];  Service: General;  Laterality: Bilateral;  . LEFT HEART CATHETERIZATION WITH CORONARY ANGIOGRAM N/A 12/22/2011   Procedure: LEFT HEART CATHETERIZATION WITH CORONARY ANGIOGRAM;  Surgeon: Troy Sine, MD;  Location: [place] [facility] CATH LAB;  Service: Cardiovascular;  Laterality: N/A;  . LEFT HEART CATHETERIZATION WITH CORONARY ANGIOGRAM Bilateral 05/16/2012   Procedure: LEFT HEART CATHETERIZATION WITH CORONARY ANGIOGRAM;  Surgeon: Lorretta Harp, MD;  Location: [facility] CATH LAB;  Service: Cardiovascular;  Laterality: Bilateral;  . LEFT HEART CATHETERIZATION WITH CORONARY ANGIOGRAM N/A 11/14/2012   Procedure: LEFT HEART CATHETERIZATION WITH CORONARY ANGIOGRAM;  Surgeon: Troy Sine, MD;  Location: [facility] CATH LAB;  Service: Cardiovascular;  Laterality: N/A;  . LEFT HEART CATHETERIZATION WITH CORONARY ANGIOGRAM N/A 09/01/2013   Procedure: LEFT HEART CATHETERIZATION WITH CORONARY ANGIOGRAM;  Surgeon: Leonie Man, MD;  Location: [facility] CATH LAB;  Service: Cardiovascular;  Laterality: N/A;  . [place]; 1973; 1985   x 3  . NM MYOCAR PERF WALL MOTION  08/30/2009   No significant ischemia  . PERCUTANEOUS CORONARY INTERVENTION-BALLOON ONLY  12/22/2011  Procedure: PERCUTANEOUS CORONARY INTERVENTION-BALLOON ONLY;  Surgeon: Troy Sine, MD;  Location: [facility] CATH LAB;  Service: Cardiovascular;;  . PERCUTANEOUS CORONARY INTERVENTION-BALLOON ONLY  11/14/2012   Procedure: PERCUTANEOUS CORONARY  INTERVENTION-BALLOON ONLY;  Surgeon: Troy Sine, MD;  Location: [facility] CATH LAB;  Service: Cardiovascular;;  . PERCUTANEOUS CORONARY STENT INTERVENTION (PCI-S)  05/16/2012   Procedure: PERCUTANEOUS CORONARY STENT INTERVENTION (PCI-S);  Surgeon: Lorretta Harp, MD;  Location: [facility] CATH LAB;  Service: Cardiovascular;;  . PERCUTANEOUS CORONARY STENT INTERVENTION (PCI-S)  09/01/2013   Procedure: PERCUTANEOUS CORONARY STENT INTERVENTION (PCI-S);  Surgeon: Leonie Man, MD;  Location: [facility] CATH LAB;  Service: Cardiovascular;;    Family History  Problem Relation Age of Onset  . Colon cancer Father   . Hypertension Mother   . Heart disease Mother   . Cancer Mother     Social History:  reports that he quit smoking about 18 months ago. He smoked 0.75 packs per day. He has never used smokeless tobacco. He reports that he does not drink alcohol or use drugs.  Allergies:  Allergies  Allergen Reactions  . Ivp Dye [Iodinated Diagnostic Agents] Itching and Rash  . Plavix [Clopidogrel Bisulfate] Rash    Medications: I have reviewed the patient's current medications.  Results for orders placed or performed during the hospital encounter of 12/09/16 (from the past 48 hour(s))  CBC with Differential     Status: Abnormal   Collection Time: 12/09/16 11:14 AM  Result Value Ref Range   WBC 12.3 (H) 4.0 - 10.5 K/uL   RBC 5.20 4.22 - 5.81 MIL/uL   Hemoglobin 17.6 (H) 13.0 - 17.0 g/dL   HCT 50.5 39.0 - 52.0 %   MCV 97.1 78.0 - 100.0 fL   MCH 33.8 26.0 - 34.0 pg   MCHC 34.9 30.0 - 36.0 g/dL   RDW 14.9 11.5 - 15.5 %   Platelets 93 (L) 150 - 400 K/uL    Comment: SPECIMEN CHECKED FOR CLOTS PLATELET COUNT CONFIRMED BY SMEAR    Neutrophils Relative % 85 %   Neutro Abs 10.4 (H) 1.7 - 7.7 K/uL   Lymphocytes Relative 9 %   Lymphs Abs 1.1 0.7 - 4.0 K/uL   Monocytes Relative 6 %   Monocytes Absolute 0.8 0.1 - 1.0 K/uL   Eosinophils Relative 0 %   Eosinophils Absolute 0.0 0.0 - 0.7 K/uL   Basophils Relative  0 %   Basophils Absolute 0.0 0.0 - 0.1 K/uL  Comprehensive metabolic panel     Status: Abnormal   Collection Time: 12/09/16 11:14 AM  Result Value Ref Range   Sodium 135 135 - 145 mmol/L   Potassium 4.4 3.5 - 5.1 mmol/L   Chloride 102 101 - 111 mmol/L   CO2 23 22 - 32 mmol/L   Glucose, Bld 278 (H) 65 - 99 mg/dL   BUN 15 6 - 20 mg/dL   Creatinine, Ser 0.84 0.61 - 1.24 mg/dL   Calcium 9.2 8.9 - 10.3 mg/dL   Total Protein 6.7 6.5 - 8.1 g/dL   Albumin 4.0 3.5 - 5.0 g/dL   AST 35 15 - 41 U/L   ALT 38 17 - 63 U/L   Alkaline Phosphatase 71 38 - 126 U/L   Total Bilirubin 0.7 0.3 - 1.2 mg/dL   GFR calc non Af Amer >60 >60 mL/min   GFR calc Af Amer >60 >60 mL/min    Comment: (NOTE) The eGFR has been calculated using the CKD EPI equation. This calculation has  not been validated in all clinical situations. eGFR's persistently <60 mL/min signify possible Chronic Kidney Disease.    Anion gap 10 5 - 15  Glucose, capillary     Status: Abnormal   Collection Time: 12/09/16  5:20 PM  Result Value Ref Range   Glucose-Capillary 215 (H) 65 - 99 mg/dL  Glucose, capillary     Status: Abnormal   Collection Time: 12/09/16  8:13 PM  Result Value Ref Range   Glucose-Capillary 171 (H) 65 - 99 mg/dL   Comment 1 Document in Chart   Glucose, capillary     Status: Abnormal   Collection Time: 12/10/16 12:59 AM  Result Value Ref Range   Glucose-Capillary 168 (H) 65 - 99 mg/dL   Comment 1 Document in Chart   Comprehensive metabolic panel     Status: Abnormal   Collection Time: 12/10/16  5:00 AM  Result Value Ref Range   Sodium 135 135 - 145 mmol/L   Potassium 3.9 3.5 - 5.1 mmol/L   Chloride 102 101 - 111 mmol/L   CO2 25 22 - 32 mmol/L   Glucose, Bld 180 (H) 65 - 99 mg/dL   BUN 13 6 - 20 mg/dL   Creatinine, Ser 0.93 0.61 - 1.24 mg/dL   Calcium 8.6 (L) 8.9 - 10.3 mg/dL   Total Protein 6.0 (L) 6.5 - 8.1 g/dL   Albumin 3.6 3.5 - 5.0 g/dL   AST 42 (H) 15 - 41 U/L   ALT 34 17 - 63 U/L   Alkaline  Phosphatase 62 38 - 126 U/L   Total Bilirubin 1.0 0.3 - 1.2 mg/dL   GFR calc non Af Amer >60 >60 mL/min   GFR calc Af Amer >60 >60 mL/min    Comment: (NOTE) The eGFR has been calculated using the CKD EPI equation. This calculation has not been validated in all clinical situations. eGFR's persistently <60 mL/min signify possible Chronic Kidney Disease.    Anion gap 8 5 - 15  CBC     Status: Abnormal   Collection Time: 12/10/16  5:00 AM  Result Value Ref Range   WBC 12.8 (H) 4.0 - 10.5 K/uL   RBC 4.94 4.22 - 5.81 MIL/uL   Hemoglobin 16.0 13.0 - 17.0 g/dL   HCT 47.7 39.0 - 52.0 %   MCV 96.6 78.0 - 100.0 fL   MCH 32.4 26.0 - 34.0 pg   MCHC 33.5 30.0 - 36.0 g/dL   RDW 15.4 11.5 - 15.5 %   Platelets 105 (L) 150 - 400 K/uL    Comment: PLATELET COUNT CONFIRMED BY SMEAR  Type and screen [facility]     Status: None (Preliminary result)   Collection Time: 12/10/16  5:10 AM  Result Value Ref Range   ABO/RH(D) O POS    Antibody Screen PENDING    Sample Expiration 12/13/2016   ABO/Rh     Status: None (Preliminary result)   Collection Time: 12/10/16  5:10 AM  Result Value Ref Range   ABO/RH(D) O POS   Glucose, capillary     Status: Abnormal   Collection Time: 12/10/16  5:42 AM  Result Value Ref Range   Glucose-Capillary 192 (H) 65 - 99 mg/dL    Dg Wrist Complete Right  Result Date: 12/09/2016 CLINICAL DATA:  Fall onto outstretched right hand this morning. EXAM: RIGHT WRIST - COMPLETE 3+ VIEW COMPARISON:  None. FINDINGS: There is a comminuted fracture of the distal radius. The primary fracture is transverse with secondary fractures  extending to the radial and lunate process of the distal radial articular surface. The fracture is impacted dorsally leading to dorsal angulation of the articular surface of 21 degrees. No other fractures.  No dislocation. There is surrounding soft tissue swelling. IMPRESSION: 1. Comminuted fracture of the distal radius with dorsal impaction and  dorsal angulation of the articular surface. Electronically Signed   By: Lajean Manes M.D.   On: 12/09/2016 09:39   Ct Head Wo Contrast  Result Date: 12/09/2016 CLINICAL DATA:  fell down 8 to 10 steps. No LOC. c/o headache, pain between shoulder blades, Pain with deep breath or cough. EXAM: CT HEAD WITHOUT CONTRAST CT CERVICAL SPINE WITHOUT CONTRAST TECHNIQUE: Multidetector CT imaging of the head and cervical spine was performed following the standard protocol without intravenous contrast. Multiplanar CT image reconstructions of the cervical spine were also generated. COMPARISON:  None. FINDINGS: CT HEAD FINDINGS Brain: No evidence of acute infarction, hemorrhage, hydrocephalus, extra-axial collection or mass lesion/mass effect. There is ventricular and sulcal enlargement reflecting mild to moderate atrophy, advanced for age. Vascular: No hyperdense vessel or unexpected calcification. Skull: The well-defined lucency crosses the left lateral frontal bone extending to the left sphenoid bone. There is also a lucency across the mid left zygoma. These have the appearance of nondisplaced fractures, but may be chronic. There is no associated soft tissue swelling. No skull lesion. Sinuses/Orbits: Globes and orbits are unremarkable. There is dependent hyperattenuating fluid in the right sphenoid sinus. Mucosal thickening lines the maxillary sinuses with mild mucosal thickening lining the ethmoid air cells and inferior right frontal sinus. Clear mastoid air cells. Other: None. CT CERVICAL SPINE FINDINGS Alignment: Normal. Skull base and vertebrae: No acute fracture. No primary bone lesion or focal pathologic process. Soft tissues and spinal canal: No prevertebral fluid or swelling. No visible canal hematoma. Disc levels: Moderate loss of disc height at Celsius 5-C6 with mild loss of disc height at C6-C7. Spondylitis bulging with uncovertebral spurring causes moderate right and mild left neural foraminal narrowing at this  level. No significant central stenosis. No convincing disc herniation. Upper chest: Unremarkable. Other: None. IMPRESSION: HEAD CT 1. No acute intracranial abnormalities.  No intracranial hemorrhage. 2. Atrophy advanced for age. 3. Apparent nondisplaced left lateral frontal bone skull fracture and mid zygoma fracture. These are likely chronic given the lack of associated soft tissue swelling. CERVICAL CT 1. No fracture or acute finding. Electronically Signed   By: Lajean Manes M.D.   On: 12/09/2016 10:17   Ct Chest Wo Contrast  Result Date: 12/09/2016 CLINICAL DATA:  Pain after fall. EXAM: CT CHEST WITHOUT CONTRAST TECHNIQUE: Multidetector CT imaging of the chest was performed following the standard protocol without IV contrast. COMPARISON:  Chest x-ray from earlier today FINDINGS: Cardiovascular: Atherosclerotic changes are seen in the thoracic aorta without aneurysmal dilatation. No effusions. The central pulmonary arteries are normal. Coronary artery calcifications are identified. The heart size is mildly enlarged. Mediastinum/Nodes: The right thyroid lobe is larger than the left but within normal limits. No thyroid nodules are identified. The esophagus is normal. No adenopathy. Lungs/Pleura: The central airways are within normal limits. No pneumothorax. Scattered atelectasis in the left lung. Paraseptal emphysematous changes seen in the apices, right greater than left. No suspicious pulmonary nodules, masses, or focal infiltrates are identified. Upper Abdomen: Evaluation of the upper abdomen is limited but unremarkable. Previous cholecystectomy. Musculoskeletal: There is a comminuted compression fracture of T3 with approximately 50% loss of vertebral body height. The posterior border of the T3  vertebral body is convex posteriorly with likely flattening of the thecal sac. There appears to be a small mildly retropulsed fragment as seen on sagittal image 99 and axial image 39. The T3 fracture also involves the  right T3 inferior facet as seen on sagittal image 94. No definitive involvement of the left facet. Lucency projected through the left T3 facet on sagittal image 105 is not seen on adjacent images and may represent a nutrient foramen or artifact. The pedicles are intact. The spinous process at this level is also intact. There is no listhesis seen at this level. No other fractures are seen in the visualized spine. The remainder of the bones are intact. Specifically, no rib fractures are identified. IMPRESSION: 1. Comminuted T3 compression fracture with approximately 50% loss of vertebral height. There is convexity to the posterior border of T3 with an apparent small mildly retropulsed fragment and probable flattening of the the thecal sac. There is also a fracture through the right T3 inferior facet. Lucency through the left T3 facet only seen on 1 image may be a nutrient foramen or artifact. 2. Scatter atelectasis in the left lung. These findings were called to Dr. Adriana Simas in the emergency room. Electronically Signed   By: Gerome Sam III M.D   On: 12/09/2016 10:37   Ct Cervical Spine Wo Contrast  Result Date: 12/09/2016 CLINICAL DATA:  fell down 8 to 10 steps. No LOC. c/o headache, pain between shoulder blades, Pain with deep breath or cough. EXAM: CT HEAD WITHOUT CONTRAST CT CERVICAL SPINE WITHOUT CONTRAST TECHNIQUE: Multidetector CT imaging of the head and cervical spine was performed following the standard protocol without intravenous contrast. Multiplanar CT image reconstructions of the cervical spine were also generated. COMPARISON:  None. FINDINGS: CT HEAD FINDINGS Brain: No evidence of acute infarction, hemorrhage, hydrocephalus, extra-axial collection or mass lesion/mass effect. There is ventricular and sulcal enlargement reflecting mild to moderate atrophy, advanced for age. Vascular: No hyperdense vessel or unexpected calcification. Skull: The well-defined lucency crosses the left lateral frontal bone  extending to the left sphenoid bone. There is also a lucency across the mid left zygoma. These have the appearance of nondisplaced fractures, but may be chronic. There is no associated soft tissue swelling. No skull lesion. Sinuses/Orbits: Globes and orbits are unremarkable. There is dependent hyperattenuating fluid in the right sphenoid sinus. Mucosal thickening lines the maxillary sinuses with mild mucosal thickening lining the ethmoid air cells and inferior right frontal sinus. Clear mastoid air cells. Other: None. CT CERVICAL SPINE FINDINGS Alignment: Normal. Skull base and vertebrae: No acute fracture. No primary bone lesion or focal pathologic process. Soft tissues and spinal canal: No prevertebral fluid or swelling. No visible canal hematoma. Disc levels: Moderate loss of disc height at Celsius 5-C6 with mild loss of disc height at C6-C7. Spondylitis bulging with uncovertebral spurring causes moderate right and mild left neural foraminal narrowing at this level. No significant central stenosis. No convincing disc herniation. Upper chest: Unremarkable. Other: None. IMPRESSION: HEAD CT 1. No acute intracranial abnormalities.  No intracranial hemorrhage. 2. Atrophy advanced for age. 3. Apparent nondisplaced left lateral frontal bone skull fracture and mid zygoma fracture. These are likely chronic given the lack of associated soft tissue swelling. CERVICAL CT 1. No fracture or acute finding. Electronically Signed   By: Amie Portland M.D.   On: 12/09/2016 10:17   Ct Wrist Right Wo Contrast  Result Date: 12/09/2016 CLINICAL DATA:  Status post fall down stairs, with known comminuted fracture of the  distal radius. Further evaluation requested. Initial encounter. EXAM: CT OF THE RIGHT WRIST WITHOUT CONTRAST TECHNIQUE: Multidetector CT imaging of the right wrist was performed according to the standard protocol. Multiplanar CT image reconstructions were also generated. COMPARISON:  Right wrist radiographs performed  earlier today at 9:21 a.m. FINDINGS: Bones/Joint/Cartilage There is a comminuted impacted fracture involving the distal radial metaphysis, with up to 7 mm of depression at the joint space. Mild dorsal angulation is again noted, with dorsal displacement of most fragments. There also appears to be a minimally displaced fracture through the tip of the trapezial ridge, at the insertion of the retinaculum. No additional fractures are seen.  The ulnar styloid appears intact. Mild subcortical cystic change is noted at the distal radius and ulna, and at the lunate. A large cyst is noted involving much of the scaphoid, with mild associated sclerosis. The cartilage is not well assessed on CT. An underlying joint effusion at the wrist is difficult to fully characterize. Ligaments Suboptimally assessed by CT. Muscles and Tendons The visualized musculature and tendon structures are grossly unremarkable. The carpal tunnel appears grossly intact. Flexor and extensor tendons are grossly unremarkable in appearance. Soft tissues Mild soft tissue edema is noted about the fracture site. No significant soft tissue hematoma is seen. IMPRESSION: 1. Comminuted impacted fracture involving the distal radial metaphysis, with up to 7 mm of depression at the joint space. Mild dorsal angulation again noted, with dorsal displacement of most fragments. 2. Minimally displaced fracture through the tip of the trapezial ridge, at the insertion of the retinaculum. 3. Large cyst involving much of the scaphoid, with mild associated sclerosis. Mild subcortical cystic change at the distal radius and ulna, and at the lunate. 4. Mild soft tissue edema about the fracture site. Electronically Signed   By: Garald Balding M.D.   On: 12/09/2016 20:07   Dg Chest Portable 1 View  Result Date: 12/09/2016 CLINICAL DATA:  Dyspnea after falling down 8-9 steps today. Abrasion and bleeding on the anterior left chest. EXAM: PORTABLE CHEST 1 VIEW COMPARISON:   03/20/2015. FINDINGS: Interval enlargement of the cardiac silhouette with increased prominence of the pulmonary vasculature and interstitial markings. Progressive aortic arch calcification. Coronary artery stent, better seen on the previous lateral view dated 12/30/2014. No visible fracture or pneumothorax. No pleural fluid. IMPRESSION: Interval cardiomegaly and minimal changes of congestive heart failure. Electronically Signed   By: Claudie Revering M.D.   On: 12/09/2016 09:35    ROS: No recent illnesses or hospitalizations Blood pressure (!) 141/70, pulse 93, temperature 98.2 F (36.8 C), temperature source Oral, resp. rate 17, height 6' (1.829 m), weight 258 lb (117 kg), SpO2 92 %. Physical Exam  General Appearance:  Alert, cooperative, no distress, appears stated age  Head:  Normocephalic,CERVICOTHORACIC COLLAR IN PLACE  Eyes:  Pupils equal, conjunctiva/corneas clear,         Throat:   Neck:      Lungs:   respirations unlabored  Chest Wall:  No tenderness or deformity  Heart:  Regular rate and rhythm,  Abdomen:   Soft, non-tender,         Extremities: Right upper extremity the patient does have the obvious deformity of the distal radius he is able to extend his thumb and extend his digits he has limitations in his wrist flexion and extension as well as forearm rotation good capillary refill good blood flow.   Pulses: 2+ and symmetric  Skin: Skin color, texture, turgor normal, no rashes or lesions  Neurologic: Normal    Assessment/Plan: Comminuted intra-articular right distal radius fracture, displaced  Rheumatoid arthritis  Diabetes  Plan:  Patient does have a displaced intra-articular distal radius fracture. He has other comorbidities that affect the healing and use of the wrist. It is my recommendation of the patient undergo operative intervention to restore the articular congruity for the displaced intra-articular distal radius fracture. Patient seen and evaluated in the  hospital. Risks of surgery included but not limited to bleeding infection damage to nearby nerves arteries or tendons nonunion malunion and hardware failure or loss of motion of wrists and digits incomplete relief of symptoms and need for further surgical intervention.  Plan to proceed with the inpatient intervention.  R/B/A DISCUSSED WITH PT IN hospital.  PT VOICED UNDERSTANDING OF PLAN CONSENT SIGNED DAY OF SURGERY PT SEEN AND EXAMINED PRIOR TO OPERATIVE PROCEDURE/DAY OF SURGERY SITE MARKED. QUESTIONS ANSWERED WILL REMAIN AN INPATIENT FOLLOWING SURGERY  WE ARE PLANNING SURGERY FOR YOUR UPPER EXTREMITY. THE RISKS AND BENEFITS OF SURGERY INCLUDE BUT NOT LIMITED TO BLEEDING INFECTION, DAMAGE TO NEARBY NERVES ARTERIES TENDONS, FAILURE OF SURGERY TO ACCOMPLISH ITS INTENDED GOALS, PERSISTENT SYMPTOMS AND NEED FOR FURTHER SURGICAL INTERVENTION. WITH THIS IN MIND WE WILL PROCEED. I HAVE DISCUSSED WITH THE PATIENT THE PRE AND POSTOPERATIVE REGIMEN AND THE DOS AND DON'TS. PT VOICED UNDERSTANDING AND INFORMED CONSENT SIGNED.  Linna Hoff 12/10/2016, 7:36 AM

## 2016-12-10 NOTE — Anesthesia Postprocedure Evaluation (Addendum)
Anesthesia Post Note  Patient: Robert [name]  Procedure(s) Performed: Procedure(s) (LRB): OPEN REDUCTION INTERNAL FIXATION (ORIF) DISTAL RADIAL FRACTURE (Right)  Patient location during evaluation: PACU Anesthesia Type: General Level of consciousness: sedated Pain management: pain level controlled Vital Signs Assessment: post-procedure vital signs reviewed and stable Respiratory status: spontaneous breathing and respiratory function stable Cardiovascular status: stable Anesthetic complications: no       Last Vitals:  Vitals:   12/10/16 1015 12/10/16 1030  BP: (!) 159/96 (!) 171/87  Pulse: 98 91  Resp: (!) 24 19  Temp:      Last Pain:  Vitals:   12/10/16 1030  TempSrc:   PainSc: Asleep                 Adaria Hole DANIEL

## 2016-12-11 ENCOUNTER — Encounter (HOSPITAL_COMMUNITY): Payer: Self-pay | Admitting: Orthopedic Surgery

## 2016-12-11 DIAGNOSIS — S22039A Unspecified fracture of third thoracic vertebra, initial encounter for closed fracture: Secondary | ICD-10-CM

## 2016-12-11 DIAGNOSIS — S5291XA Unspecified fracture of right forearm, initial encounter for closed fracture: Secondary | ICD-10-CM

## 2016-12-11 LAB — GLUCOSE, CAPILLARY
GLUCOSE-CAPILLARY: 161 mg/dL — AB (ref 65–99)
GLUCOSE-CAPILLARY: 181 mg/dL — AB (ref 65–99)
GLUCOSE-CAPILLARY: 231 mg/dL — AB (ref 65–99)
Glucose-Capillary: 169 mg/dL — ABNORMAL HIGH (ref 65–99)
Glucose-Capillary: 272 mg/dL — ABNORMAL HIGH (ref 65–99)

## 2016-12-11 MED ORDER — DOCUSATE SODIUM 100 MG PO CAPS: 100.0000 mg | ORAL_CAPSULE | Freq: Every day | ORAL | Status: DC

## 2016-12-11 MED ORDER — POLYETHYLENE GLYCOL 3350 17 G PO PACK
17.0000 g | PACK | Freq: Every day | ORAL | Status: DC
  Administered 2016-12-11 – 2016-12-13 (×3): 17 g via ORAL
  Filled 2016-12-11 (×2): qty 1

## 2016-12-11 NOTE — Evaluation (Signed)
Occupational Therapy Evaluation Patient Details Name: Robert [name] MRN: [id number] DOB: [birth year] Today's Date: 12/11/2016    History of Present Illness 66 year old morbidly obese male with hypertension, coronary artery disease, GERD, previous mellitus type II, rheumatoid arthritis fell down his basement stairs. He was taken to [facility] and evaluated and found to have a T3 compression fracture, skull fracture, and right wrist fracture. Transfer was requested to our trauma center. Now s/p ORIF distal radius fx.    Clinical Impression   Pt admitted with the above diagnoses and presents with below problem list. Pt will benefit from continued acute OT to address the below listed deficits and maximize independence with basic ADLs prior to d/c home with family assisting. PTA pt was mostly independent with ADLs, occasional use of cane. Pt is currently +2 min physical A to s/e with transfers and in-room ambulation. Spouse present and included in session. Pt and spouse want to d/c home, have family members who can assist. Of note, pt on RA during in-room ambulation to recliner with O2 sats dropping to as low as 77. Did have episode of congested coughing upon standing just prior to walking. Supplemental O2 of 3.5LPM reapplied at end of session with O2 sat recovering to 95.       Follow Up Recommendations  Home health OT;Supervision/Assistance - 24 hour    Equipment Recommendations  3 in 1 bedside commode    Recommendations for Other Services PT consult     Precautions / Restrictions Precautions Precautions: Fall;Other (comment) (T3 fx, in extended neck brace at all times) Precaution Booklet Issued: No Required Braces or Orthoses: Other Brace/Splint (extended neck brace ) Restrictions Weight Bearing Restrictions: Yes RUE Weight Bearing: Weight bear through elbow only      Mobility Bed Mobility Overal bed mobility: Needs Assistance             General bed mobility comments:  Pt EOB with nurse upon OT arrival. He reports needing some assistance with bed mobility. Edcuated on logroll technique.  Transfers Overall transfer level: Needs assistance Equipment used: 2 person hand held assist Transfers: Sit to/from Stand Sit to Stand: Min assist;+2 physical assistance;From elevated surface         General transfer comment: 2x from EOB at elevated bed height, 1x to sit in recliner. Assist at R elbow and LUE.     Balance Overall balance assessment: History of Falls;Needs assistance Sitting-balance support: Single extremity supported;Feet supported Sitting balance-Leahy Scale: Fair     Standing balance support: Bilateral upper extremity supported;During functional activity Standing balance-Leahy Scale: Poor Standing balance comment: assist at R elbow and LUE, +2 assist.                           ADL either performed or assessed with clinical judgement   ADL Overall ADL's : Needs assistance/impaired Eating/Feeding: Set up;Sitting   Grooming: Sitting;Minimal assistance;Set up   Upper Body Bathing: Moderate assistance;Sitting   Lower Body Bathing: Minimal assistance;+2 for physical assistance;Sit to/from stand;Cueing for compensatory techniques;Cueing for back precautions   Upper Body Dressing : Moderate assistance;Sitting   Lower Body Dressing: Moderate assistance;+2 for physical assistance;Sit to/from stand   Toilet Transfer: Minimal assistance;+2 for physical assistance;Ambulation;+2 for safety/equipment;BSC   Toileting- Clothing Manipulation and Hygiene: Moderate assistance;+2 for physical assistance;+2 for safety/equipment;Sit to/from stand;Cueing for back precautions       Functional mobility during ADLs: Minimal assistance;+2 for physical assistance;+2 for safety/equipment (+1 assist at R  elbow level while pt pushed IV pole ) General ADL Comments: Pt completed 2 sit<>stands from EOB min +2 physical assist. Ambulated around bed to  recliner on opposite side of room +2 min physical assist (assist at R elbow with spouse stabilizing IV pole on left side while pt pushed IV pole.) Pt reports feeling "not great" about his in-room walk in terms of balance. Reports needing assist with bed mobility. Spouse reports she was present for bed mobility and feels that she can manage pt's level of assist at home. Reports 2 grandchildren living in the home that can also assist as well as multiple family members.      Vision Baseline Vision/History: Wears glasses Patient Visual Report: No change from baseline;Other (comment) (did not have glasses during OT eval)       Perception     Praxis      Pertinent Vitals/Pain Pain Assessment: Faces Faces Pain Scale: Hurts even more Pain Location: R wrist>upper back Pain Descriptors / Indicators: Aching;Grimacing;Sore Pain Intervention(s): Limited activity within patient's tolerance;Monitored during session;Repositioned     Hand Dominance Right   Extremity/Trunk Assessment Upper Extremity Assessment Upper Extremity Assessment: RUE deficits/detail RUE Deficits / Details: edema in R digits noted. Wrist immobilized. RUE: Unable to fully assess due to immobilization   Lower Extremity Assessment Lower Extremity Assessment: Defer to PT evaluation   Cervical / Trunk Assessment Cervical / Trunk Assessment: Other exceptions (in extended neck brace)   Communication Communication Communication: No difficulties   Cognition Arousal/Alertness: Awake/alert Behavior During Therapy: WFL for tasks assessed/performed Overall Cognitive Status: Within Functional Limits for tasks assessed                                     General Comments  Pt received on 3.5L O2 via [place]. Pt on RA during ambulation to recliner with sats dropping from 89-90 EOB to as low as 77 during ambulation. Discussed breathing techniques prior to walking and encouraged in those techniques during session. Pt noted to  have an episode of congested coughing upon standing. Supplemental O2 via [place] reapplied at 3.5L with O2 recovering to 95 sitting in recliner at end of session.     Exercises     Shoulder Instructions      Home Living Family/patient expects to be discharged to:: Private residence Living Arrangements: Spouse/significant other Available Help at Discharge: Family;Available 24 hours/day Type of Home: House Home Access: Stairs to enter [employer] of Steps: 3 Entrance Stairs-Rails: Left Home Layout: Multi-level;Able to live on main level with bedroom/bathroom     Bathroom Shower/Tub: Teacher, early years/pre: Handicapped height Bathroom Accessibility: Yes How Accessible: Accessible via walker Home Equipment: [employer] - single point          Prior Functioning/Environment Level of Independence: Independent        Comments: occasional use of cane        OT Problem List: Decreased activity tolerance;Impaired balance (sitting and/or standing);Decreased knowledge of use of DME or AE;Decreased knowledge of precautions;Cardiopulmonary status limiting activity;Obesity;Impaired UE functional use;Pain;Increased edema      OT Treatment/Interventions: Self-care/ADL training;DME and/or AE instruction;Therapeutic activities;Patient/family education;Balance training    OT Goals(Current goals can be found in the care plan section) Acute Rehab OT Goals Patient Stated Goal: home when ready OT Goal Formulation: With patient/family Time For Goal Achievement: 12/18/16 Potential to Achieve Goals: Good ADL Goals Pt Will Perform Grooming: with set-up;sitting  Pt Will Perform Upper Body Bathing: with set-up;sitting Pt Will Perform Lower Body Bathing: with adaptive equipment;sit to/from stand;with min assist Pt Will Perform Upper Body Dressing: with set-up;sitting Pt Will Perform Lower Body Dressing: with min assist;with adaptive equipment;sit to/from stand Pt Will Transfer to  Toilet: with min assist;ambulating Pt Will Perform Toileting - Clothing Manipulation and hygiene: with min assist;sit to/from stand;with adaptive equipment Pt Will Perform Tub/Shower Transfer: Tub transfer;with mod assist;ambulating;3 in 1 Pt/caregiver will Perform Home Exercise Program: Independently (edema control ROM R digits) Additional ADL Goal #1: Pt will complete bed mobility at min A level to prepare for OOB ADLs.   OT Frequency: Min 3X/week   Barriers to D/C:    Currently +2 assist, lives with spouse. Has family members nearby who can help out.       Co-evaluation              End of Session Equipment Utilized During Treatment: Gait belt;Other (comment);Oxygen (extended neck brace; O2 off during ambulation to chair) Nurse Communication: Other (comment) (O2 sats during session. Reapplied [place]. )  Activity Tolerance: Other (comment);Patient limited by pain (O2 sats dropping to 77 on RA while walking to chair.) Patient left: in chair;with call bell/phone within reach;with family/visitor present  OT Visit Diagnosis: Unsteadiness on feet (R26.81);History of falling (Z91.81);Pain Pain - Right/Left: Right Pain - part of body: Arm                Time: 1007-1045 OT Time Calculation (min): 38 min Charges:  OT General Charges $OT Visit: 1 Procedure OT Evaluation $OT Eval Moderate Complexity: 1 Procedure OT Treatments $Self Care/Home Management : 8-22 mins G-Codes:       Hortencia Pilar 12/11/2016, 12:19 PM

## 2016-12-11 NOTE — Progress Notes (Signed)
Subjective: Patient reports brace uncomfortable  Objective: Vital signs in last 24 hours: Temp:  [97.3 F (36.3 C)-99.3 F (37.4 C)] 99.3 F (37.4 C) (04/02 0441) Pulse Rate:  [90-106] 90 (04/02 0441) Resp:  [15-24] 17 (04/02 0441) BP: (151-181)/(69-96) 162/85 (04/02 0441) SpO2:  [88 %-95 %] 94 % (04/02 0441)  Intake/Output from previous day: 04/01 0701 - 04/02 0700 In: 850 [P.O.:200; I.V.:600; IV Piggyback:50] Out: 655 [Urine:650; Blood:5] Intake/Output this shift: No intake/output data recorded.  Physical Exam: MAEW with good strength.  Back not terribly uncomfortable.  Lab Results:  Recent Labs  12/09/16 1114 12/10/16 0500  WBC 12.3* 12.8*  HGB 17.6* 16.0  HCT 50.5 47.7  PLT 93* 105*   BMET  Recent Labs  12/09/16 1114 12/10/16 0500  NA 135 135  K 4.4 3.9  CL 102 102  CO2 23 25  GLUCOSE 278* 180*  BUN 15 13  CREATININE 0.84 0.93  CALCIUM 9.2 8.6*    Studies/Results: Dg Wrist Complete Right  Result Date: 12/09/2016 CLINICAL DATA:  Fall onto outstretched right hand this morning. EXAM: RIGHT WRIST - COMPLETE 3+ VIEW COMPARISON:  None. FINDINGS: There is a comminuted fracture of the distal radius. The primary fracture is transverse with secondary fractures extending to the radial and lunate process of the distal radial articular surface. The fracture is impacted dorsally leading to dorsal angulation of the articular surface of 21 degrees. No other fractures.  No dislocation. There is surrounding soft tissue swelling. IMPRESSION: 1. Comminuted fracture of the distal radius with dorsal impaction and dorsal angulation of the articular surface. Electronically Signed   By: Lajean Manes M.D.   On: 12/09/2016 09:39   Ct Head Wo Contrast  Result Date: 12/09/2016 CLINICAL DATA:  fell down 8 to 10 steps. No LOC. c/o headache, pain between shoulder blades, Pain with deep breath or cough. EXAM: CT HEAD WITHOUT CONTRAST CT CERVICAL SPINE WITHOUT CONTRAST TECHNIQUE:  Multidetector CT imaging of the head and cervical spine was performed following the standard protocol without intravenous contrast. Multiplanar CT image reconstructions of the cervical spine were also generated. COMPARISON:  None. FINDINGS: CT HEAD FINDINGS Brain: No evidence of acute infarction, hemorrhage, hydrocephalus, extra-axial collection or mass lesion/mass effect. There is ventricular and sulcal enlargement reflecting mild to moderate atrophy, advanced for age. Vascular: No hyperdense vessel or unexpected calcification. Skull: The well-defined lucency crosses the left lateral frontal bone extending to the left sphenoid bone. There is also a lucency across the mid left zygoma. These have the appearance of nondisplaced fractures, but may be chronic. There is no associated soft tissue swelling. No skull lesion. Sinuses/Orbits: Globes and orbits are unremarkable. There is dependent hyperattenuating fluid in the right sphenoid sinus. Mucosal thickening lines the maxillary sinuses with mild mucosal thickening lining the ethmoid air cells and inferior right frontal sinus. Clear mastoid air cells. Other: None. CT CERVICAL SPINE FINDINGS Alignment: Normal. Skull base and vertebrae: No acute fracture. No primary bone lesion or focal pathologic process. Soft tissues and spinal canal: No prevertebral fluid or swelling. No visible canal hematoma. Disc levels: Moderate loss of disc height at Celsius 5-C6 with mild loss of disc height at C6-C7. Spondylitis bulging with uncovertebral spurring causes moderate right and mild left neural foraminal narrowing at this level. No significant central stenosis. No convincing disc herniation. Upper chest: Unremarkable. Other: None. IMPRESSION: HEAD CT 1. No acute intracranial abnormalities.  No intracranial hemorrhage. 2. Atrophy advanced for age. 3. Apparent nondisplaced left lateral frontal bone skull  fracture and mid zygoma fracture. These are likely chronic given the lack of  associated soft tissue swelling. CERVICAL CT 1. No fracture or acute finding. Electronically Signed   By: Lajean Manes M.D.   On: 12/09/2016 10:17   Ct Chest Wo Contrast  Result Date: 12/09/2016 CLINICAL DATA:  Pain after fall. EXAM: CT CHEST WITHOUT CONTRAST TECHNIQUE: Multidetector CT imaging of the chest was performed following the standard protocol without IV contrast. COMPARISON:  Chest x-ray from earlier today FINDINGS: Cardiovascular: Atherosclerotic changes are seen in the thoracic aorta without aneurysmal dilatation. No effusions. The central pulmonary arteries are normal. Coronary artery calcifications are identified. The heart size is mildly enlarged. Mediastinum/Nodes: The right thyroid lobe is larger than the left but within normal limits. No thyroid nodules are identified. The esophagus is normal. No adenopathy. Lungs/Pleura: The central airways are within normal limits. No pneumothorax. Scattered atelectasis in the left lung. Paraseptal emphysematous changes seen in the apices, right greater than left. No suspicious pulmonary nodules, masses, or focal infiltrates are identified. Upper Abdomen: Evaluation of the upper abdomen is limited but unremarkable. Previous cholecystectomy. Musculoskeletal: There is a comminuted compression fracture of T3 with approximately 50% loss of vertebral body height. The posterior border of the T3 vertebral body is convex posteriorly with likely flattening of the thecal sac. There appears to be a small mildly retropulsed fragment as seen on sagittal image 99 and axial image 39. The T3 fracture also involves the right T3 inferior facet as seen on sagittal image 94. No definitive involvement of the left facet. Lucency projected through the left T3 facet on sagittal image 105 is not seen on adjacent images and may represent a nutrient foramen or artifact. The pedicles are intact. The spinous process at this level is also intact. There is no listhesis seen at this level.  No other fractures are seen in the visualized spine. The remainder of the bones are intact. Specifically, no rib fractures are identified. IMPRESSION: 1. Comminuted T3 compression fracture with approximately 50% loss of vertebral height. There is convexity to the posterior border of T3 with an apparent small mildly retropulsed fragment and probable flattening of the the thecal sac. There is also a fracture through the right T3 inferior facet. Lucency through the left T3 facet only seen on 1 image may be a nutrient foramen or artifact. 2. Scatter atelectasis in the left lung. These findings were called to Dr. Lacinda Axon in the emergency room. Electronically Signed   By: Dorise Bullion III M.D   On: 12/09/2016 10:37   Ct Cervical Spine Wo Contrast  Result Date: 12/09/2016 CLINICAL DATA:  fell down 8 to 10 steps. No LOC. c/o headache, pain between shoulder blades, Pain with deep breath or cough. EXAM: CT HEAD WITHOUT CONTRAST CT CERVICAL SPINE WITHOUT CONTRAST TECHNIQUE: Multidetector CT imaging of the head and cervical spine was performed following the standard protocol without intravenous contrast. Multiplanar CT image reconstructions of the cervical spine were also generated. COMPARISON:  None. FINDINGS: CT HEAD FINDINGS Brain: No evidence of acute infarction, hemorrhage, hydrocephalus, extra-axial collection or mass lesion/mass effect. There is ventricular and sulcal enlargement reflecting mild to moderate atrophy, advanced for age. Vascular: No hyperdense vessel or unexpected calcification. Skull: The well-defined lucency crosses the left lateral frontal bone extending to the left sphenoid bone. There is also a lucency across the mid left zygoma. These have the appearance of nondisplaced fractures, but may be chronic. There is no associated soft tissue swelling. No skull lesion. Sinuses/Orbits:  Globes and orbits are unremarkable. There is dependent hyperattenuating fluid in the right sphenoid sinus. Mucosal  thickening lines the maxillary sinuses with mild mucosal thickening lining the ethmoid air cells and inferior right frontal sinus. Clear mastoid air cells. Other: None. CT CERVICAL SPINE FINDINGS Alignment: Normal. Skull base and vertebrae: No acute fracture. No primary bone lesion or focal pathologic process. Soft tissues and spinal canal: No prevertebral fluid or swelling. No visible canal hematoma. Disc levels: Moderate loss of disc height at Celsius 5-C6 with mild loss of disc height at C6-C7. Spondylitis bulging with uncovertebral spurring causes moderate right and mild left neural foraminal narrowing at this level. No significant central stenosis. No convincing disc herniation. Upper chest: Unremarkable. Other: None. IMPRESSION: HEAD CT 1. No acute intracranial abnormalities.  No intracranial hemorrhage. 2. Atrophy advanced for age. 3. Apparent nondisplaced left lateral frontal bone skull fracture and mid zygoma fracture. These are likely chronic given the lack of associated soft tissue swelling. CERVICAL CT 1. No fracture or acute finding. Electronically Signed   By: Lajean Manes M.D.   On: 12/09/2016 10:17   Ct Wrist Right Wo Contrast  Result Date: 12/09/2016 CLINICAL DATA:  Status post fall down stairs, with known comminuted fracture of the distal radius. Further evaluation requested. Initial encounter. EXAM: CT OF THE RIGHT WRIST WITHOUT CONTRAST TECHNIQUE: Multidetector CT imaging of the right wrist was performed according to the standard protocol. Multiplanar CT image reconstructions were also generated. COMPARISON:  Right wrist radiographs performed earlier today at 9:21 a.m. FINDINGS: Bones/Joint/Cartilage There is a comminuted impacted fracture involving the distal radial metaphysis, with up to 7 mm of depression at the joint space. Mild dorsal angulation is again noted, with dorsal displacement of most fragments. There also appears to be a minimally displaced fracture through the tip of the  trapezial ridge, at the insertion of the retinaculum. No additional fractures are seen.  The ulnar styloid appears intact. Mild subcortical cystic change is noted at the distal radius and ulna, and at the lunate. A large cyst is noted involving much of the scaphoid, with mild associated sclerosis. The cartilage is not well assessed on CT. An underlying joint effusion at the wrist is difficult to fully characterize. Ligaments Suboptimally assessed by CT. Muscles and Tendons The visualized musculature and tendon structures are grossly unremarkable. The carpal tunnel appears grossly intact. Flexor and extensor tendons are grossly unremarkable in appearance. Soft tissues Mild soft tissue edema is noted about the fracture site. No significant soft tissue hematoma is seen. IMPRESSION: 1. Comminuted impacted fracture involving the distal radial metaphysis, with up to 7 mm of depression at the joint space. Mild dorsal angulation again noted, with dorsal displacement of most fragments. 2. Minimally displaced fracture through the tip of the trapezial ridge, at the insertion of the retinaculum. 3. Large cyst involving much of the scaphoid, with mild associated sclerosis. Mild subcortical cystic change at the distal radius and ulna, and at the lunate. 4. Mild soft tissue edema about the fracture site. Electronically Signed   By: Garald Balding M.D.   On: 12/09/2016 20:07   Dg Chest Portable 1 View  Result Date: 12/09/2016 CLINICAL DATA:  Dyspnea after falling down 8-9 steps today. Abrasion and bleeding on the anterior left chest. EXAM: PORTABLE CHEST 1 VIEW COMPARISON:  03/20/2015. FINDINGS: Interval enlargement of the cardiac silhouette with increased prominence of the pulmonary vasculature and interstitial markings. Progressive aortic arch calcification. Coronary artery stent, better seen on the previous lateral view dated  12/30/2014. No visible fracture or pneumothorax. No pleural fluid. IMPRESSION: Interval cardiomegaly  and minimal changes of congestive heart failure. Electronically Signed   By: Claudie Revering M.D.   On: 12/09/2016 09:35    Assessment/Plan: Refit brace.  Mobilize with PT.  D/C when stable.  F/U in office in 4 weeks with X rays.    LOS: 2 days    Peggyann Shoals, MD 12/11/2016, 8:09 AM

## 2016-12-11 NOTE — Evaluation (Signed)
Physical Therapy Evaluation Patient Details Name: Robert [name] MRN: [id number] DOB: September 15, 1950 Today's Date: 12/11/2016   History of Present Illness  66 year old morbidly obese male with hypertension, coronary artery disease, GERD, previous mellitus type II, rheumatoid arthritis fell down his basement stairs. He was taken to [facility] and evaluated and found to have a T3 compression fracture, skull fracture, and right wrist fracture. Transfer was requested to our trauma center. Now s/p   Clinical Impression  Pt admitted with above diagnosis. Pt currently with functional limitations due to the deficits listed below (see PT Problem List). Pt was able to ambulate with right PFRW with some assist. Should progress well and be able to go home with wife with equipment and f/u as below.  Pt will benefit from skilled PT to increase their independence and safety with mobility to allow discharge to the venue listed below.      Follow Up Recommendations Home health PT;Supervision/Assistance - 24 hour    Equipment Recommendations  Rolling walker with 5" wheels;3in1 (PT) (right platform)    Recommendations for Other Services       Precautions / Restrictions Precautions Precautions: Fall;Other (comment) (T3 fx, in extended neck brace at all times) Precaution Booklet Issued: No Required Braces or Orthoses: Other Brace/Splint (extended neck brace ) Restrictions Weight Bearing Restrictions: Yes RUE Weight Bearing: Weight bear through elbow only      Mobility  Bed Mobility Overal bed mobility: Needs Assistance Bed Mobility: Sit to Sidelying         Sit to sidelying: Mod assist General bed mobility comments: Needed assit with LEs to lie to side.  Rolled with min cues.   Transfers Overall transfer level: Needs assistance Equipment used: Right platform walker Transfers: Sit to/from Stand Sit to Stand: Min assist;+2 physical assistance;Mod assist         General transfer comment:  Incr assist to stand from recliner. Assist at R elbow to place on the platform.    Ambulation/Gait Ambulation/Gait assistance: Min assist;+2 safety/equipment Ambulation Distance (Feet): 30 Feet Assistive device: Right platform walker Gait Pattern/deviations: Step-to pattern;Decreased step length - right;Decreased step length - left;Decreased stride length;Drifts right/left;Trunk flexed;Wide base of support   Gait velocity interpretation: Below normal speed for age/gender General Gait Details: Pt needed cues to stay close to RW to ambulate needing assit to steer RW as well.  Overall pt did well just needing incr help for lines.    Stairs            Wheelchair Mobility    Modified Rankin (Stroke Patients Only)       Balance Overall balance assessment: History of Falls;Needs assistance Sitting-balance support: Single extremity supported;Feet supported Sitting balance-Leahy Scale: Fair     Standing balance support: Bilateral upper extremity supported;During functional activity Standing balance-Leahy Scale: Poor Standing balance comment: +2 assist for safety with right PFRW which pt is reliant on for balance.                              Pertinent Vitals/Pain Pain Assessment: Faces Faces Pain Scale: Hurts even more Pain Location: R wrist>upper back Pain Descriptors / Indicators: Aching;Grimacing;Sore Pain Intervention(s): Limited activity within patient's tolerance;Monitored during session;Premedicated before session;Repositioned  See sats below.  Home Living Family/patient expects to be discharged to:: Private residence Living Arrangements: Spouse/significant other Available Help at Discharge: Family;Available 24 hours/day Type of Home: House Home Access: Stairs to enter Entrance Stairs-Rails: Left Entrance Stairs-Number  of Steps: 3 Home Layout: Multi-level;Able to live on main level with bedroom/bathroom Home Equipment: Kasandra Knudsen - single point      Prior  Function Level of Independence: Independent         Comments: occasional use of cane     Hand Dominance   Dominant Hand: Right    Extremity/Trunk Assessment   Upper Extremity Assessment Upper Extremity Assessment: Defer to OT evaluation    Lower Extremity Assessment Lower Extremity Assessment: Generalized weakness    Cervical / Trunk Assessment Cervical / Trunk Assessment: Other exceptions (in extended neck brace)  Communication   Communication: No difficulties  Cognition Arousal/Alertness: Awake/alert Behavior During Therapy: WFL for tasks assessed/performed Overall Cognitive Status: Within Functional Limits for tasks assessed                                        General Comments General comments (skin integrity, edema, etc.): desat to 85% on RA.  92-95 % on 3.5LO2.  Left on the 3.5LO2.      Exercises     Assessment/Plan    PT Assessment Patient needs continued PT services  PT Problem List Decreased activity tolerance;Decreased balance;Decreased mobility;Decreased knowledge of use of DME;Decreased safety awareness;Decreased knowledge of precautions;Pain       PT Treatment Interventions DME instruction;Gait training;Functional mobility training;Therapeutic activities;Therapeutic exercise;Stair training;Balance training;Patient/family education    PT Goals (Current goals can be found in the Care Plan section)  Acute Rehab PT Goals Patient Stated Goal: home when ready PT Goal Formulation: With patient/family Time For Goal Achievement: 12/25/16 Potential to Achieve Goals: Good    Frequency Min 6X/week   Barriers to discharge        Co-evaluation               End of Session Equipment Utilized During Treatment: Gait belt;Oxygen;Back brace;Cervical collar Activity Tolerance: Patient limited by fatigue;Patient limited by pain Patient left: in bed;with call bell/phone within reach;with bed alarm set;with family/visitor present;with  SCD's reapplied Nurse Communication: Mobility status PT Visit Diagnosis: Unsteadiness on feet (R26.81);Muscle weakness (generalized) (M62.81);Pain Pain - Right/Left: Right Pain - part of body: Arm    Time: 1200-1230 PT Time Calculation (min) (ACUTE ONLY): 30 min   Charges:   PT Evaluation $PT Eval Moderate Complexity: 1 Procedure PT Treatments $Gait Training: 8-22 mins   PT G Codes:        Teresea Donley,PT Acute Rehabilitation [phone] (pager)   Denice Paradise 12/11/2016, 2:24 PM

## 2016-12-11 NOTE — Progress Notes (Signed)
Inpatient Diabetes Program Recommendations  AACE/ADA: New Consensus Statement on Inpatient Glycemic Control (2015)  Target Ranges:  Prepandial:   less than 140 mg/dL      Peak postprandial:   less than 180 mg/dL (1-2 hours)      Critically ill patients:  140 - 180 mg/dL   Results for LUCCIANO, VITALI (MRN [id number]) as of 12/11/2016 13:27  Ref. Range 12/10/2016 00:59 12/10/2016 05:42 12/10/2016 15:38 12/10/2016 20:28 12/11/2016 00:10 12/11/2016 04:34 12/11/2016 13:06  Glucose-Capillary Latest Ref Range: 65 - 99 mg/dL 168 (H) 192 (H) 214 (H) 209 (H) 169 (H) 161 (H) 272 (H)   Review of Glycemic Control  Diabetes history: DM2 Outpatient Diabetes medications: Lantus 80 units QHS, Glipizide XL 10 mg BID, Metformin 1000 mg BID Current orders for Inpatient glycemic control: Novolog 0-20 units Q4H  Inpatient Diabetes Program Recommendations: Insulin - Basal: Please consider ordering Lantus 18 units Q24H starting now (based on 117 kg x 0.15 units). Correction (SSI): If patient is eating and tolerating diet, please consider changing frequency of CBGs and Novolog to ACHS.  Thanks, Barnie Alderman, RN, MSN, CDE Diabetes Coordinator Inpatient Diabetes Program [phone] (Team Pager from 8am to 5pm)

## 2016-12-11 NOTE — Progress Notes (Signed)
[facility] Progress Note Patient Details:  Robert [name] [birth year] [id number]  Patient ID: Robert [name], male   DOB: Oct 09, 1950, 66 y.o.   MRN: [id number]   [name] 12/11/2016, 9:14 AMCalled [employer] to check braces fit.

## 2016-12-11 NOTE — Progress Notes (Signed)
[facility] Progress Note  1 Day Post-Op  Subjective: Pain is controlled with pain regiment. No new numbness or tingling. Does not like brace.  Objective: Vital signs in last 24 hours: Temp:  [97.3 F (36.3 C)-99.3 F (37.4 C)] 99.3 F (37.4 C) (04/02 0441) Pulse Rate:  [90-106] 90 (04/02 0441) Resp:  [15-24] 17 (04/02 0441) BP: (151-181)/(69-96) 162/85 (04/02 0441) SpO2:  [88 %-95 %] 94 % (04/02 0441) Last BM Date: 12/09/16  Intake/Output from previous day: 04/01 0701 - 04/02 0700 In: 850 [P.O.:200; I.V.:600; IV Piggyback:50] Out: 655 [Urine:650; Blood:5] Intake/Output this shift: No intake/output data recorded.  PE: Gen:  Alert, NAD, pleasant, cooperative Neck: extended brace in place Card:  RRR, no M/G/R heard Pulm:  CTA, no increased work of breathing, rate and effort normal Skin: no rashes noted, warm and dry Ext: RUE with case in place, sensation intact, brisk cap refill Neuro: A&O x 3, appropriate mood  Lab Results:   Recent Labs  12/09/16 1114 12/10/16 0500  WBC 12.3* 12.8*  HGB 17.6* 16.0  HCT 50.5 47.7  PLT 93* 105*   BMET  Recent Labs  12/09/16 1114 12/10/16 0500  NA 135 135  K 4.4 3.9  CL 102 102  CO2 23 25  GLUCOSE 278* 180*  BUN 15 13  CREATININE 0.84 0.93  CALCIUM 9.2 8.6*   PT/INR No results for input(s): LABPROT, INR in the last 72 hours. CMP     Component Value Date/Time   NA 135 12/10/2016 0500   NA 135 01/27/2015 0936   K 3.9 12/10/2016 0500   CL 102 12/10/2016 0500   CO2 25 12/10/2016 0500   GLUCOSE 180 (H) 12/10/2016 0500   BUN 13 12/10/2016 0500   BUN 21 01/27/2015 0936   CREATININE 0.93 12/10/2016 0500   CALCIUM 8.6 (L) 12/10/2016 0500   PROT 6.0 (L) 12/10/2016 0500   PROT 7.4 01/27/2015 0936   ALBUMIN 3.6 12/10/2016 0500   ALBUMIN 4.4 01/27/2015 0936   AST 42 (H) 12/10/2016 0500   ALT 34 12/10/2016 0500   ALKPHOS 62 12/10/2016 0500   BILITOT 1.0 12/10/2016 0500   BILITOT <0.2 01/27/2015 0936    GFRNONAA >60 12/10/2016 0500   GFRAA >60 12/10/2016 0500   Lipase  No results found for: LIPASE     Studies/Results: Dg Wrist Complete Right  Result Date: 12/09/2016 CLINICAL DATA:  Fall onto outstretched right hand this morning. EXAM: RIGHT WRIST - COMPLETE 3+ VIEW COMPARISON:  None. FINDINGS: There is a comminuted fracture of the distal radius. The primary fracture is transverse with secondary fractures extending to the radial and lunate process of the distal radial articular surface. The fracture is impacted dorsally leading to dorsal angulation of the articular surface of 21 degrees. No other fractures.  No dislocation. There is surrounding soft tissue swelling. IMPRESSION: 1. Comminuted fracture of the distal radius with dorsal impaction and dorsal angulation of the articular surface. Electronically Signed   By: Lajean Manes M.D.   On: 12/09/2016 09:39   Ct Head Wo Contrast  Result Date: 12/09/2016 CLINICAL DATA:  fell down 8 to 10 steps. No LOC. c/o headache, pain between shoulder blades, Pain with deep breath or cough. EXAM: CT HEAD WITHOUT CONTRAST CT CERVICAL SPINE WITHOUT CONTRAST TECHNIQUE: Multidetector CT imaging of the head and cervical spine was performed following the standard protocol without intravenous contrast. Multiplanar CT image reconstructions of the cervical spine were also generated. COMPARISON:  None. FINDINGS: CT HEAD FINDINGS Brain:  No evidence of acute infarction, hemorrhage, hydrocephalus, extra-axial collection or mass lesion/mass effect. There is ventricular and sulcal enlargement reflecting mild to moderate atrophy, advanced for age. Vascular: No hyperdense vessel or unexpected calcification. Skull: The well-defined lucency crosses the left lateral frontal bone extending to the left sphenoid bone. There is also a lucency across the mid left zygoma. These have the appearance of nondisplaced fractures, but may be chronic. There is no associated soft tissue swelling.  No skull lesion. Sinuses/Orbits: Globes and orbits are unremarkable. There is dependent hyperattenuating fluid in the right sphenoid sinus. Mucosal thickening lines the maxillary sinuses with mild mucosal thickening lining the ethmoid air cells and inferior right frontal sinus. Clear mastoid air cells. Other: None. CT CERVICAL SPINE FINDINGS Alignment: Normal. Skull base and vertebrae: No acute fracture. No primary bone lesion or focal pathologic process. Soft tissues and spinal canal: No prevertebral fluid or swelling. No visible canal hematoma. Disc levels: Moderate loss of disc height at Celsius 5-C6 with mild loss of disc height at C6-C7. Spondylitis bulging with uncovertebral spurring causes moderate right and mild left neural foraminal narrowing at this level. No significant central stenosis. No convincing disc herniation. Upper chest: Unremarkable. Other: None. IMPRESSION: HEAD CT 1. No acute intracranial abnormalities.  No intracranial hemorrhage. 2. Atrophy advanced for age. 3. Apparent nondisplaced left lateral frontal bone skull fracture and mid zygoma fracture. These are likely chronic given the lack of associated soft tissue swelling. CERVICAL CT 1. No fracture or acute finding. Electronically Signed   By: Lajean Manes M.D.   On: 12/09/2016 10:17   Ct Chest Wo Contrast  Result Date: 12/09/2016 CLINICAL DATA:  Pain after fall. EXAM: CT CHEST WITHOUT CONTRAST TECHNIQUE: Multidetector CT imaging of the chest was performed following the standard protocol without IV contrast. COMPARISON:  Chest x-ray from earlier today FINDINGS: Cardiovascular: Atherosclerotic changes are seen in the thoracic aorta without aneurysmal dilatation. No effusions. The central pulmonary arteries are normal. Coronary artery calcifications are identified. The heart size is mildly enlarged. Mediastinum/Nodes: The right thyroid lobe is larger than the left but within normal limits. No thyroid nodules are identified. The esophagus  is normal. No adenopathy. Lungs/Pleura: The central airways are within normal limits. No pneumothorax. Scattered atelectasis in the left lung. Paraseptal emphysematous changes seen in the apices, right greater than left. No suspicious pulmonary nodules, masses, or focal infiltrates are identified. Upper Abdomen: Evaluation of the upper abdomen is limited but unremarkable. Previous cholecystectomy. Musculoskeletal: There is a comminuted compression fracture of T3 with approximately 50% loss of vertebral body height. The posterior border of the T3 vertebral body is convex posteriorly with likely flattening of the thecal sac. There appears to be a small mildly retropulsed fragment as seen on sagittal image 99 and axial image 39. The T3 fracture also involves the right T3 inferior facet as seen on sagittal image 94. No definitive involvement of the left facet. Lucency projected through the left T3 facet on sagittal image 105 is not seen on adjacent images and may represent a nutrient foramen or artifact. The pedicles are intact. The spinous process at this level is also intact. There is no listhesis seen at this level. No other fractures are seen in the visualized spine. The remainder of the bones are intact. Specifically, no rib fractures are identified. IMPRESSION: 1. Comminuted T3 compression fracture with approximately 50% loss of vertebral height. There is convexity to the posterior border of T3 with an apparent small mildly retropulsed fragment and probable flattening  of the the thecal sac. There is also a fracture through the right T3 inferior facet. Lucency through the left T3 facet only seen on 1 image may be a nutrient foramen or artifact. 2. Scatter atelectasis in the left lung. These findings were called to Dr. Lacinda Axon in the emergency room. Electronically Signed   By: Dorise Bullion III M.D   On: 12/09/2016 10:37   Ct Cervical Spine Wo Contrast  Result Date: 12/09/2016 CLINICAL DATA:  fell down 8 to 10  steps. No LOC. c/o headache, pain between shoulder blades, Pain with deep breath or cough. EXAM: CT HEAD WITHOUT CONTRAST CT CERVICAL SPINE WITHOUT CONTRAST TECHNIQUE: Multidetector CT imaging of the head and cervical spine was performed following the standard protocol without intravenous contrast. Multiplanar CT image reconstructions of the cervical spine were also generated. COMPARISON:  None. FINDINGS: CT HEAD FINDINGS Brain: No evidence of acute infarction, hemorrhage, hydrocephalus, extra-axial collection or mass lesion/mass effect. There is ventricular and sulcal enlargement reflecting mild to moderate atrophy, advanced for age. Vascular: No hyperdense vessel or unexpected calcification. Skull: The well-defined lucency crosses the left lateral frontal bone extending to the left sphenoid bone. There is also a lucency across the mid left zygoma. These have the appearance of nondisplaced fractures, but may be chronic. There is no associated soft tissue swelling. No skull lesion. Sinuses/Orbits: Globes and orbits are unremarkable. There is dependent hyperattenuating fluid in the right sphenoid sinus. Mucosal thickening lines the maxillary sinuses with mild mucosal thickening lining the ethmoid air cells and inferior right frontal sinus. Clear mastoid air cells. Other: None. CT CERVICAL SPINE FINDINGS Alignment: Normal. Skull base and vertebrae: No acute fracture. No primary bone lesion or focal pathologic process. Soft tissues and spinal canal: No prevertebral fluid or swelling. No visible canal hematoma. Disc levels: Moderate loss of disc height at Celsius 5-C6 with mild loss of disc height at C6-C7. Spondylitis bulging with uncovertebral spurring causes moderate right and mild left neural foraminal narrowing at this level. No significant central stenosis. No convincing disc herniation. Upper chest: Unremarkable. Other: None. IMPRESSION: HEAD CT 1. No acute intracranial abnormalities.  No intracranial hemorrhage.  2. Atrophy advanced for age. 3. Apparent nondisplaced left lateral frontal bone skull fracture and mid zygoma fracture. These are likely chronic given the lack of associated soft tissue swelling. CERVICAL CT 1. No fracture or acute finding. Electronically Signed   By: Lajean Manes M.D.   On: 12/09/2016 10:17   Ct Wrist Right Wo Contrast  Result Date: 12/09/2016 CLINICAL DATA:  Status post fall down stairs, with known comminuted fracture of the distal radius. Further evaluation requested. Initial encounter. EXAM: CT OF THE RIGHT WRIST WITHOUT CONTRAST TECHNIQUE: Multidetector CT imaging of the right wrist was performed according to the standard protocol. Multiplanar CT image reconstructions were also generated. COMPARISON:  Right wrist radiographs performed earlier today at 9:21 a.m. FINDINGS: Bones/Joint/Cartilage There is a comminuted impacted fracture involving the distal radial metaphysis, with up to 7 mm of depression at the joint space. Mild dorsal angulation is again noted, with dorsal displacement of most fragments. There also appears to be a minimally displaced fracture through the tip of the trapezial ridge, at the insertion of the retinaculum. No additional fractures are seen.  The ulnar styloid appears intact. Mild subcortical cystic change is noted at the distal radius and ulna, and at the lunate. A large cyst is noted involving much of the scaphoid, with mild associated sclerosis. The cartilage is not well assessed on  CT. An underlying joint effusion at the wrist is difficult to fully characterize. Ligaments Suboptimally assessed by CT. Muscles and Tendons The visualized musculature and tendon structures are grossly unremarkable. The carpal tunnel appears grossly intact. Flexor and extensor tendons are grossly unremarkable in appearance. Soft tissues Mild soft tissue edema is noted about the fracture site. No significant soft tissue hematoma is seen. IMPRESSION: 1. Comminuted impacted fracture  involving the distal radial metaphysis, with up to 7 mm of depression at the joint space. Mild dorsal angulation again noted, with dorsal displacement of most fragments. 2. Minimally displaced fracture through the tip of the trapezial ridge, at the insertion of the retinaculum. 3. Large cyst involving much of the scaphoid, with mild associated sclerosis. Mild subcortical cystic change at the distal radius and ulna, and at the lunate. 4. Mild soft tissue edema about the fracture site. Electronically Signed   By: Garald Balding M.D.   On: 12/09/2016 20:07   Dg Chest Portable 1 View  Result Date: 12/09/2016 CLINICAL DATA:  Dyspnea after falling down 8-9 steps today. Abrasion and bleeding on the anterior left chest. EXAM: PORTABLE CHEST 1 VIEW COMPARISON:  03/20/2015. FINDINGS: Interval enlargement of the cardiac silhouette with increased prominence of the pulmonary vasculature and interstitial markings. Progressive aortic arch calcification. Coronary artery stent, better seen on the previous lateral view dated 12/30/2014. No visible fracture or pneumothorax. No pleural fluid. IMPRESSION: Interval cardiomegaly and minimal changes of congestive heart failure. Electronically Signed   By: Claudie Revering M.D.   On: 12/09/2016 09:35    Anti-infectives: Anti-infectives    Start     Dose/Rate Route Frequency Ordered Stop   12/10/16 1600  ceFAZolin (ANCEF) IVPB 1 g/50 mL premix     1 g 100 mL/hr over 30 Minutes Intravenous Every 8 hours 12/10/16 1112 12/11/16 1559   12/10/16 1400  ceFAZolin (ANCEF) IVPB 1 g/50 mL premix  Status:  Discontinued     1 g 100 mL/hr over 30 Minutes Intravenous Every 8 hours 12/10/16 1110 12/10/16 1112   12/10/16 0600  ceFAZolin (ANCEF) IVPB 2g/100 mL premix  Status:  Discontinued     2 g 200 mL/hr over 30 Minutes Intravenous On call to O.R. 12/09/16 1624 12/10/16 1055       Assessment/Plan s/p fall L frontal skull fracture T3 comminuted Fx with loss of height-now in extended  neck brace, no surgery planned, neuro following Comminuted Rt radius fx - S/P ORIF 12/10/16, Dr. Caralyn Guile Multiple abrasions HTN CAD DM2 obese on insulin Tobacco use RA Morbid obesity  FEN: carb modified diet VTE: lovenox ID: pre-op only  Dispo: OT/PT pending   LOS: 2 days    Kalman Drape , [facility] Surgery 12/11/2016, 9:21 AM Pager: [phone] Consults: [phone] Mon-Fri 7:00 am-4:30 pm Sat-Sun 7:00 am-11:30 am

## 2016-12-12 LAB — GLUCOSE, CAPILLARY
Glucose-Capillary: 188 mg/dL — ABNORMAL HIGH (ref 65–99)
Glucose-Capillary: 213 mg/dL — ABNORMAL HIGH (ref 65–99)
Glucose-Capillary: 187 mg/dL — ABNORMAL HIGH (ref 65–99)
Glucose-Capillary: 272 mg/dL — ABNORMAL HIGH (ref 65–99)
Glucose-Capillary: 317 mg/dL — ABNORMAL HIGH (ref 65–99)
Glucose-Capillary: 276 mg/dL — ABNORMAL HIGH (ref 65–99)

## 2016-12-12 MED ORDER — INSULIN GLARGINE 100 UNIT/ML ~~LOC~~ SOLN
23.0000 [IU] | SUBCUTANEOUS | Status: AC
  Administered 2016-12-12: 23 [IU] via SUBCUTANEOUS
  Filled 2016-12-12: qty 0.23

## 2016-12-12 MED ORDER — INSULIN ASPART 100 UNIT/ML ~~LOC~~ SOLN
0.0000 [IU] | Freq: Every day | SUBCUTANEOUS | Status: DC
  Administered 2016-12-12: 3 [IU] via SUBCUTANEOUS

## 2016-12-12 MED ORDER — INSULIN ASPART 100 UNIT/ML ~~LOC~~ SOLN
0.0000 [IU] | Freq: Three times a day (TID) | SUBCUTANEOUS | Status: DC
Start: 2016-12-12 — End: 2016-12-13
  Administered 2016-12-12: 15 [IU] via SUBCUTANEOUS
  Administered 2016-12-13: 20 [IU] via SUBCUTANEOUS
  Administered 2016-12-13: 11 [IU] via SUBCUTANEOUS

## 2016-12-12 MED ORDER — LORATADINE 10 MG PO TABS
10.0000 mg | ORAL_TABLET | Freq: Every day | ORAL | Status: DC
  Administered 2016-12-12 – 2016-12-13 (×2): 10 mg via ORAL
  Filled 2016-12-12 (×2): qty 1

## 2016-12-12 MED ORDER — IPRATROPIUM-ALBUTEROL 0.5-2.5 (3) MG/3ML IN SOLN
3.0000 mL | Freq: Four times a day (QID) | RESPIRATORY_TRACT | Status: DC
  Administered 2016-12-12 (×2): 3 mL via RESPIRATORY_TRACT
  Filled 2016-12-12 (×2): qty 3

## 2016-12-12 MED ORDER — INSULIN GLARGINE 100 UNIT/ML ~~LOC~~ SOLN: 23.0000 [IU] | Freq: Every day | SUBCUTANEOUS | Status: DC

## 2016-12-12 MED ORDER — INSULIN GLARGINE 100 UNIT/ML ~~LOC~~ SOLN
23.0000 [IU] | Freq: Every day | SUBCUTANEOUS | Status: DC
  Administered 2016-12-13: 23 [IU] via SUBCUTANEOUS
  Filled 2016-12-12: qty 0.23

## 2016-12-12 NOTE — Progress Notes (Addendum)
qPhysical Therapy Treatment Patient Details Name: Robert [name] MRN: [id number] DOB: 1950-12-13 Today's Date: 12/12/2016    History of Present Illness 66 year old morbidly obese male with hypertension, coronary artery disease, GERD, previous mellitus type II, rheumatoid arthritis fell down his basement stairs. He was taken to [facility] and evaluated and found to have a T3 compression fracture, skull fracture, and right wrist fracture. Transfer was requested to our trauma center. Now s/p     PT Comments    Patient continues to progress toward mobility goals. SpO2 desat on RA to 84% at rest, 88-89% on 2L O2 while ambulating, and 96% end of session on 4L O2 via [place]. Continue to progress as tolerated with anticipated d/c home with HHPT.    Follow Up Recommendations  Home health PT;Supervision/Assistance - 24 hour     Equipment Recommendations  Rolling walker with 5" wheels;3in1 (PT) (right platform)    Recommendations for Other Services       Precautions / Restrictions Precautions Precautions: Fall;Other (comment) (T3 fx, in extended neck brace at all times) Precaution Booklet Issued: No Required Braces or Orthoses: Other Brace/Splint (extended neck brace ) Restrictions Weight Bearing Restrictions: Yes RUE Weight Bearing: Weight bear through elbow only    Mobility  Bed Mobility               General bed mobility comments: pt OOB in chair upon arrival; discussed log roll technique and pt and wife verbalized understanding  Transfers Overall transfer level: Needs assistance Equipment used: Right platform walker Transfers: Sit to/from Stand Sit to Stand: Min assist;Mod assist         General transfer comment: mod first trial and min second trial from recliner with cues for technique and hand placement  Ambulation/Gait Ambulation/Gait assistance: Min assist;+2 safety/equipment Ambulation Distance (Feet): 100 Feet Assistive device: Right platform walker Gait  Pattern/deviations: Decreased stride length;Step-through pattern Gait velocity: decreased   General Gait Details: pt with step through pattern and safe use of AD; cues for pursed lip breathing technique; SpO2 88-89% on 2L O2 while ambulating   Stairs Stairs: Yes   Stair Management: Two rails;Step to pattern;Forwards Number of Stairs: 2 General stair comments: cues for step to pattern; min guard for safety  Wheelchair Mobility    Modified Rankin (Stroke Patients Only)       Balance Overall balance assessment: History of Falls;Needs assistance Sitting-balance support: Single extremity supported;Feet supported Sitting balance-Leahy Scale: Fair     Standing balance support: Bilateral upper extremity supported;During functional activity Standing balance-Leahy Scale: Poor                              Cognition Arousal/Alertness: Awake/alert Behavior During Therapy: WFL for tasks assessed/performed Overall Cognitive Status: Within Functional Limits for tasks assessed                                        Exercises      General Comments General comments (skin integrity, edema, etc.): SpO2 84% on RA beginning of session; with mobility pt on 2L O2 however unable to maintain SpO2 at 90% and end of session pt back on 4L O2 with SpO2 96%      Pertinent Vitals/Pain Pain Assessment: Faces Faces Pain Scale: Hurts even more Pain Location: when coughing Pain Descriptors / Indicators: Aching;Grimacing;Sore;Guarding Pain Intervention(s): Limited activity within  patient's tolerance;Monitored during session;Repositioned;Patient requesting pain meds-RN notified    Home Living                      Prior Function            PT Goals (current goals can now be found in the care plan section) Acute Rehab PT Goals PT Goal Formulation: With patient/family Time For Goal Achievement: 12/25/16 Potential to Achieve Goals: Good Progress towards PT  goals: Progressing toward goals    Frequency    Min 6X/week      PT Plan Current plan remains appropriate    Co-evaluation             End of Session Equipment Utilized During Treatment: Gait belt;Oxygen;Back brace;Cervical collar (2-4L O2 via ) Activity Tolerance: Patient tolerated treatment well Patient left: with call bell/phone within reach;with family/visitor present;in chair Nurse Communication: Mobility status PT Visit Diagnosis: Unsteadiness on feet (R26.81);Muscle weakness (generalized) (M62.81);Pain Pain - Right/Left: Right Pain - part of body: Arm     Time: 9449-6759 PT Time Calculation (min) (ACUTE ONLY): 34 min  Charges:  $Gait Training: 8-22 mins $Therapeutic Activity: 8-22 mins                    G Codes:       Earney Navy, PTA Pager: [phone]     Darliss Cheney 12/12/2016, 2:28 PM

## 2016-12-12 NOTE — Progress Notes (Addendum)
Subjective: Patient reports "I feel ok. Pain comes and goes in my hand and back"  Objective: Vital signs in last 24 hours: Temp:  [98 F (36.7 C)-99.8 F (37.7 C)] 98.2 F (36.8 C) (04/03 0656) Pulse Rate:  [85-97] 85 (04/03 0656) Resp:  [18] 18 (04/02 1631) BP: (132-159)/(48-85) 159/85 (04/03 0656) SpO2:  [92 %-94 %] 92 % (04/03 0656)  Intake/Output from previous day: 04/02 0701 - 04/03 0700 In: 720 [P.O.:720] Out: 400 [Urine:400] Intake/Output this shift: No intake/output data recorded.  Alert, conversant, sitting on side of bed. Voices no complaints, no pain at present. MAEW, with exception of braces right forearm splint, [place] collar with thoracic extension. Good strength. Hopeful of d/c to home.   Lab Results:  Recent Labs  12/09/16 1114 12/10/16 0500  WBC 12.3* 12.8*  HGB 17.6* 16.0  HCT 50.5 47.7  PLT 93* 105*   BMET  Recent Labs  12/09/16 1114 12/10/16 0500  NA 135 135  K 4.4 3.9  CL 102 102  CO2 23 25  GLUCOSE 278* 180*  BUN 15 13  CREATININE 0.84 0.93  CALCIUM 9.2 8.6*    Studies/Results: No results found.  Assessment/Plan: Improving  LOS: 3 days  Office f/u with DrStern in 1 month with x-rays.    Verdis Prime 12/12/2016, 8:03 AM   Mobilize as tolerated.  OK to D/C home.  F/U 1 month with X rays.

## 2016-12-12 NOTE — Progress Notes (Signed)
Occupational Therapy Treatment Patient Details Name: Robert [name] MRN: [id number] DOB: 03/04/1951 Today's Date: 12/12/2016    History of present illness 66 year old morbidly obese male with hypertension, coronary artery disease, GERD, previous mellitus type II, rheumatoid arthritis fell down his basement stairs. He was taken to [facility] and evaluated and found to have a T3 compression fracture, skull fracture, and right wrist fracture. Transfer was requested to our trauma center. Now s/p    OT comments  Pt demonstrated progress towards goals and increased activity tolerance. Pt performed grooming at sink with Min guard A and maintaining standing for ~5-10 min. Pt required Min A for transfers and functional mobility. Provided education on platform walker management and fall prevention. Discussed LB dressing and sponge bathing with pt and wife; both confirm that wife will A. Will continue to follow acutely. Continue to recommend dc home once medically stable with HHOT to increase independence and safety during ADLs.    Follow Up Recommendations  Home health OT;Supervision/Assistance - 24 hour    Equipment Recommendations  3 in 1 bedside commode    Recommendations for Other Services PT consult    Precautions / Restrictions Precautions Precautions: Fall;Other (comment) (T3 fx, in extended neck brace at all times) Precaution Booklet Issued: No Precaution Comments: Went over no BLT Required Braces or Orthoses: Other Brace/Splint (extended neck brace ) Restrictions Weight Bearing Restrictions: Yes RUE Weight Bearing: Weight bear through elbow only       Mobility Bed Mobility               General bed mobility comments: In recliner upon arrival  Transfers Overall transfer level: Needs assistance Equipment used: Right platform walker Transfers: Sit to/from Stand Sit to Stand: Min assist (from recliner (lower) surface)         General transfer comment: Incr assist  to stand from recliner. Assist at R elbow to place on the platform.      Balance Overall balance assessment: History of Falls;Needs assistance Sitting-balance support: Single extremity supported;Feet supported Sitting balance-Leahy Scale: Fair     Standing balance support: Bilateral upper extremity supported;During functional activity Standing balance-Leahy Scale: Poor Standing balance comment: Required Right platform walker and Minguard for functional mobility                           ADL either performed or assessed with clinical judgement   ADL Overall ADL's : Needs assistance/impaired     Grooming: Wash/dry face;Oral care;Wash/dry hands;Brushing hair;Min guard;Standing Grooming Details (indicate cue type and reason): Pt maintained standing at sink for grooming for ~5-10 min demonstrating increased activity tolerance               Lower Body Dressing Details (indicate cue type and reason): Wife confirmed that she plans to A with LB dressing             Functional mobility during ADLs: Minimal assistance;Rolling walker General ADL Comments: Pt completed grooming at sink and sit<>stand from reclienr x2. educated pt on BLT precautions and Right platform walker management. Pt required O2 for acitvity and was on 4L O2 during ADLs.     Vision       Perception     Praxis      Cognition Arousal/Alertness: Awake/alert Behavior During Therapy: WFL for tasks assessed/performed Overall Cognitive Status: Within Functional Limits for tasks assessed  Exercises     Shoulder Instructions       General Comments  Test pt tolerance on room air while at rest and his SpO2 droped to 84. Pt completed ADLs and functional mobility on 4L and stayed in 90s. Pt had blood on L sock at big toe. Provided him with new socks and reported to RN    Pertinent Vitals/ Pain       Pain Assessment: 0-10 Pain Score: 6  Pain  Location: R wrist>upper back Pain Descriptors / Indicators: Aching;Grimacing;Sore Pain Intervention(s): Monitored during session  Home Living                                          Prior Functioning/Environment              Frequency  Min 3X/week        Progress Toward Goals  OT Goals(current goals can now be found in the care plan section)  Progress towards OT goals: Progressing toward goals  Acute Rehab OT Goals Patient Stated Goal: home when ready OT Goal Formulation: With patient/family Time For Goal Achievement: 12/18/16 Potential to Achieve Goals: Good ADL Goals Pt Will Perform Grooming: with set-up;sitting Pt Will Perform Upper Body Bathing: with set-up;sitting Pt Will Perform Lower Body Bathing: with adaptive equipment;sit to/from stand;with min assist Pt Will Perform Upper Body Dressing: with set-up;sitting Pt Will Perform Lower Body Dressing: with min assist;with adaptive equipment;sit to/from stand Pt Will Transfer to Toilet: with min assist;ambulating Pt Will Perform Toileting - Clothing Manipulation and hygiene: with min assist;sit to/from stand;with adaptive equipment Pt Will Perform Tub/Shower Transfer: Tub transfer;with mod assist;ambulating;3 in 1 Pt/caregiver will Perform Home Exercise Program: Independently Additional ADL Goal #1: Pt will complete bed mobility at min A level to prepare for OOB ADLs.   Plan Discharge plan remains appropriate    Co-evaluation                 End of Session Equipment Utilized During Treatment: Oxygen (Right platform walker)  OT Visit Diagnosis: Unsteadiness on feet (R26.81);History of falling (Z91.81);Pain Pain - Right/Left: Right Pain - part of body: Arm   Activity Tolerance Patient tolerated treatment well   Patient Left in chair;with call bell/phone within reach;with family/visitor present   Nurse Communication Mobility status;Other (comment) (Request for banadage for L foot which  has dried blood on L sock)        Time: 3500-9381 OT Time Calculation (min): 35 min  Charges: OT General Charges $OT Visit: 1 Procedure OT Treatments $Self Care/Home Management : 23-37 mins  [place], OTR/L [phone]   [place] 12/12/2016, 9:47 AM

## 2016-12-12 NOTE — Progress Notes (Addendum)
{  PTSATURATION QUALIFICATIONS: (This note is used to comply with regulatory documentation for home oxygen)  Patient Saturations on Room Air at Rest = 84%  Patient Saturations on Room Air while Ambulating = 81%  Patient Saturations on 2 Liters of oxygen while Ambulating = 89%  Please briefly explain why patient needs home oxygen: SpO2 desat on RA  Earney Navy, PTA Pager: [phone]

## 2016-12-12 NOTE — Progress Notes (Signed)
[facility] Progress Note  2 Days Post-Op  Subjective: Pt states he is feeling good. No new complaints today. Pain is mild and well controlled on the pain regiment.   Objective: Vital signs in last 24 hours: Temp:  [98 F (36.7 C)-99.8 F (37.7 C)] 98.2 F (36.8 C) (04/03 0656) Pulse Rate:  [85-97] 85 (04/03 0656) Resp:  [18] 18 (04/02 1631) BP: (132-159)/(48-85) 159/85 (04/03 0656) SpO2:  [92 %-94 %] 92 % (04/03 0656) Last BM Date: 12/08/16  Intake/Output from previous day: 04/02 0701 - 04/03 0700 In: 720 [P.O.:720] Out: 400 [Urine:400] Intake/Output this shift: No intake/output data recorded.  PE: Gen:  Alert, NAD, pleasant, cooperative Neck: extended brace in place Card:  RRR, no M/G/R heard Pulm:  CTA, no increased work of breathing, rate and effort normal Skin: no rashes noted, warm and dry Ext: RUE with splint in place, sensation intact, brisk cap refill Neuro: A&O x 3, appropriate mood  Lab Results:   Recent Labs  12/09/16 1114 12/10/16 0500  WBC 12.3* 12.8*  HGB 17.6* 16.0  HCT 50.5 47.7  PLT 93* 105*   BMET  Recent Labs  12/09/16 1114 12/10/16 0500  NA 135 135  K 4.4 3.9  CL 102 102  CO2 23 25  GLUCOSE 278* 180*  BUN 15 13  CREATININE 0.84 0.93  CALCIUM 9.2 8.6*   PT/INR No results for input(s): LABPROT, INR in the last 72 hours. CMP     Component Value Date/Time   NA 135 12/10/2016 0500   NA 135 01/27/2015 0936   K 3.9 12/10/2016 0500   CL 102 12/10/2016 0500   CO2 25 12/10/2016 0500   GLUCOSE 180 (H) 12/10/2016 0500   BUN 13 12/10/2016 0500   BUN 21 01/27/2015 0936   CREATININE 0.93 12/10/2016 0500   CALCIUM 8.6 (L) 12/10/2016 0500   PROT 6.0 (L) 12/10/2016 0500   PROT 7.4 01/27/2015 0936   ALBUMIN 3.6 12/10/2016 0500   ALBUMIN 4.4 01/27/2015 0936   AST 42 (H) 12/10/2016 0500   ALT 34 12/10/2016 0500   ALKPHOS 62 12/10/2016 0500   BILITOT 1.0 12/10/2016 0500   BILITOT <0.2 01/27/2015 0936   GFRNONAA >60  12/10/2016 0500   GFRAA >60 12/10/2016 0500   Lipase  No results found for: LIPASE     Studies/Results: No results found.  Anti-infectives: Anti-infectives    Start     Dose/Rate Route Frequency Ordered Stop   12/10/16 1600  ceFAZolin (ANCEF) IVPB 1 g/50 mL premix     1 g 100 mL/hr over 30 Minutes Intravenous Every 8 hours 12/10/16 1112 12/11/16 0900   12/10/16 1400  ceFAZolin (ANCEF) IVPB 1 g/50 mL premix  Status:  Discontinued     1 g 100 mL/hr over 30 Minutes Intravenous Every 8 hours 12/10/16 1110 12/10/16 1112   12/10/16 0600  ceFAZolin (ANCEF) IVPB 2g/100 mL premix  Status:  Discontinued     2 g 200 mL/hr over 30 Minutes Intravenous On call to O.R. 12/09/16 1624 12/10/16 1055       Assessment/Plan s/p fall L frontal skull fracture T3 comminuted Fx with loss of height- immobilization in Cervical collar with thoracic extension (SOMI) brace.  This should heal and not require surgery. Outpt neurosurgery f/u with serial X rays. Immobilization for there next 6 - 8 weeks.  neurosurgery following Comminuted Rt radius fx - S/P ORIF 12/10/16, Dr. Caralyn Guile Multiple abrasions HTN CAD DM2 obese on insulin Tobacco use RA Morbid obesity  FEN: carb modified diet VTE: lovenox ID: pre-op only  Dispo: OT/PT recommending HHPT, 24 hour supervision. Pt can likely be discharged today pending ortho recommendations.    LOS: 3 days    Kalman Drape , [facility] 12/12/2016, 7:46 AM Pager: [phone] Consults: [phone] Mon-Fri 7:00 am-4:30 pm Sat-Sun 7:00 am-11:30 am

## 2016-12-12 NOTE — Progress Notes (Addendum)
Inpatient Diabetes Program Recommendations  AACE/ADA: New Consensus Statement on Inpatient Glycemic Control (2015)  Target Ranges:  Prepandial:   less than 140 mg/dL      Peak postprandial:   less than 180 mg/dL (1-2 hours)      Critically ill patients:  140 - 180 mg/dL   Lab Results  Component Value Date   GLUCAP 272 (H) 12/12/2016   HGBA1C 9.6 (H) 10/08/2016   Results for ADANTE, COURINGTON (MRN [id number]) as of 12/12/2016 13:54  Ref. Range 12/11/2016 04:34 12/11/2016 13:06 12/11/2016 16:53 12/11/2016 22:06 12/12/2016 01:06 12/12/2016 05:06 12/12/2016 08:04 12/12/2016 12:03  Glucose-Capillary Latest Ref Range: 65 - 99 mg/dL 161 (H) 272 (H) 181 (H) 231 (H) 188 (H) 213 (H) 187 (H) 272 (H)    Review of Glycemic Control  Diabetes history: DM2  Outpatient Diabetes medications: Lantus 80 units QHS, Glipizide XL 10 mg BID, Metformin 1000 mg BID  Current orders for Inpatient glycemic control: Novolog 0-20 units Q4H  Inpatient Diabetes Program Recommendations:   Insulin - Basal: Please consider ordering Lantus 23 units Q24H starting now (based on 117 kg x 0.2 units).  Correction (SSI): If patient is eating and tolerating diet, please consider changing frequency of CBGs and Novolog to ACHS.  Paged Jackson Latino.  Thank you,  Windy Carina, RN, MSN Diabetes Coordinator Inpatient Diabetes Program [phone] (Team Pager)

## 2016-12-13 LAB — GLUCOSE, CAPILLARY
GLUCOSE-CAPILLARY: 259 mg/dL — AB (ref 65–99)
Glucose-Capillary: 374 mg/dL — ABNORMAL HIGH (ref 65–99)

## 2016-12-13 MED ORDER — IPRATROPIUM-ALBUTEROL 0.5-2.5 (3) MG/3ML IN SOLN
3.0000 mL | Freq: Three times a day (TID) | RESPIRATORY_TRACT | Status: DC
  Filled 2016-12-13 (×2): qty 3

## 2016-12-13 MED ORDER — OXYCODONE HCL 5 MG PO TABS
5.0000 mg | ORAL_TABLET | Freq: Four times a day (QID) | ORAL | 0 refills | Status: DC | PRN
Start: 2016-12-13 — End: 2017-03-05

## 2016-12-13 NOTE — Progress Notes (Signed)
Occupational Therapy Treatment Patient Details Name: Robert [name] MRN: [id number] DOB: [birth year] Today's Date: 12/13/2016    History of present illness 66 year old morbidly obese male with hypertension, coronary artery disease, GERD, previous mellitus type II, rheumatoid arthritis fell down his basement stairs. He was taken to [facility] and evaluated and found to have a T3 compression fracture, skull fracture, and right wrist fracture. Transfer was requested to our trauma center. Now s/p    OT comments  Pt making progress with functional goals. Educated pt and his wife on DME and ADL A/E for home use  Follow Up Recommendations       Equipment Recommendations  3 in 1 bedside commode;Other (comment) (reacher, LH sponge, LH shoe horn)    Recommendations for Other Services      Precautions / Restrictions Precautions Precautions: Fall;Other (comment) (neck brace on at all times) Precaution Comments: reviewed cervical precautions with pt Required Braces or Orthoses: Other Brace/Splint (extended neck brace) Other Brace/Splint: soft cast R UE Restrictions Weight Bearing Restrictions: Yes RUE Weight Bearing: Weight bear through elbow only       Mobility Bed Mobility               General bed mobility comments: pt OOB in chair upon arrival  Transfers Overall transfer level: Needs assistance Equipment used: Right platform walker Transfers: Sit to/from Stand Sit to Stand: Min assist              Balance Overall balance assessment: History of Falls;Needs assistance Sitting-balance support: Single extremity supported;Feet supported Sitting balance-Leahy Scale: Fair     Standing balance support: Bilateral upper extremity supported;During functional activity Standing balance-Leahy Scale: Poor Standing balance comment: Required Right platform walker and Minguard for functional mobility                           ADL either performed or assessed with  clinical judgement   ADL Overall ADL's : Needs assistance/impaired     Grooming: Wash/dry face;Wash/dry hands;Standing;Min guard                   Armed forces technical officer: Minimal assistance;Ambulation;+2 for safety/equipment;RW;Comfort height toilet   Toileting- Clothing Manipulation and Hygiene: Moderate assistance;Sit to/from stand;Cueing for back precautions       Functional mobility during ADLs: Minimal assistance;Rolling walker General ADL Comments: Pt and his wife educated on ADL A/E for home use     Vision Baseline Vision/History: Wears glasses Patient Visual Report: No change from baseline                Cognition Arousal/Alertness: Awake/alert Behavior During Therapy: WFL for tasks assessed/performed Overall Cognitive Status: Within Functional Limits for tasks assessed                                                      General Comments  pt very pleasant and cooperative    Pertinent Vitals/ Pain       Pain Assessment: Faces Faces Pain Scale: Hurts even more Pain Location: R UE, neck Pain Descriptors / Indicators: Throbbing;Sore;Grimacing;Guarding Pain Intervention(s): Limited activity within patient's tolerance;Monitored during session;Premedicated before session;Repositioned  Home Living  Prior Functioning/Environment              Frequency  Min 3X/week        Progress Toward Goals  OT Goals(current goals can now be found in the care plan section)  Progress towards OT goals: Progressing toward goals  Acute Rehab OT Goals Patient Stated Goal: home today  Plan Discharge plan remains appropriate                     End of Session Equipment Utilized During Treatment: Oxygen;Rolling walker;Gait belt (PFRW)  OT Visit Diagnosis: Unsteadiness on feet (R26.81);History of falling (Z91.81);Pain Pain - Right/Left: Right Pain - part of body: Arm (neck)    Activity Tolerance Patient tolerated treatment well   Patient Left in chair;with call bell/phone within reach;with family/visitor present   Nurse Communication      Functional Assessment Tool Used: AM-PAC 6 Clicks Daily Activity   Time: 1840-3754 OT Time Calculation (min): 25 min  Charges: OT G-codes **NOT FOR INPATIENT CLASS** Functional Assessment Tool Used: AM-PAC 6 Clicks Daily Activity OT General Charges $OT Visit: 1 Procedure OT Treatments $Self Care/Home Management : 8-22 mins $Therapeutic Activity: 8-22 mins     Britt Bottom 12/13/2016, 12:59 PM

## 2016-12-13 NOTE — Discharge Summary (Signed)
[facility] Discharge Summary   Patient ID: Robert [name] MRN: [id number] DOB/AGE: 64-10-1950 64 y.o.  Admit date: 12/09/2016 Discharge date: 12/13/2016  Admitting Diagnosis: Fall Right radial fracture T3 fracture  Discharge Diagnosis Patient Active Problem List   Diagnosis Date Noted  . Right radial fracture 12/11/2016  . T3 vertebral fracture ([place]) 12/11/2016  . Fall 12/09/2016  . Thrombocytopenia ([place]) 10/09/2016  . Cellulitis 10/08/2016  . Diabetes ([place]) 10/08/2016  . Chronic combined systolic and diastolic CHF (congestive heart failure) ([place]) 04/25/2016  . Coronary artery disease   . High cholesterol   . Type II diabetes mellitus ([place])   . Essential hypertension   . Non-STEMI (non-ST elevated myocardial infarction) ([place])   . CAD S/P percutaneous coronary angioplasty   . Unstable angina ([place]) 03/20/2015  . ICM-EF 35% at cath 02/01/15 02/02/2015  . CAD (coronary artery disease) 01/09/2015  . DM type 2 causing vascular disease, not at goal [facility]) 05/29/2014  . Dyspnea 10/20/2013  . COPD (chronic obstructive pulmonary disease) ([place]) 09/03/2013  . Hyperlipidemia   . Old myocardial infarction 09/01/2013  . Abnormal nuclear stress test 05/17/2012  . S/P CFX PTCAI for ISR 02/01/15 05/17/2012  . PVD, chronic LLE 12/22/2011  . Rheumatoid arthritis ([place]) 12/22/2011  . Contrast media allergy 12/22/2011  . Type II IDDM 12/21/2011  . HTN (hypertension) 12/21/2011  . Tobacco abuse 12/21/2011    Consultants Dr. Vertell Limber, Neurosurgery Dr. Caralyn Guile, Orthopedics  Imaging: No results found.  Procedures Dr. Caralyn Guile (12/10/16) - Open reduction and internal fixation of displaced intra-articular distal radius fracture 3 or more fragments Right wrist brachia radialis tendon release and lengthening  HPI: 66 year old morbidly obese male with hypertension, coronary artery disease, GERD, previous mellitus type II, rheumatoid arthritis fell down his basement stairs which was  about 8 feet with no loss of consciousness. He immediate had back pain and right wrist pain. He did land on his left side. He was taken to [facility] and evaluated and found to have a T3 compression fracture, skull fracture, and right wrist fracture.   Hospital Course:  Pt was transferred to [facility] and admitted to the trauma service.  Patient was admitted and underwent procedure listed above.  Tolerated procedure well. Pt was placed in a cervical collar with thoracic extension (SOMI) brace recommended by neurosurgery.  Diet was advanced as tolerated.  On hospital day 4, the patient was voiding well, tolerating diet, ambulating well, pain well controlled, vital signs stable, and felt stable for discharge home. He was found to need O2 to keep his saturations up. He was set up with home O2. Patient will follow up in our office in 2 weeks and knows to call with questions or concerns.  He will call to confirm appointment date/time.    Patient was discharged in good condition.  The [place] Substance controlled database was reviewed prior to prescribing narcotic pain medication to this patient.    Allergies as of 12/13/2016      Reactions   Ivp Dye [iodinated Diagnostic Agents] Itching, Rash   Plavix [clopidogrel Bisulfate] Rash      Medication List    STOP taking these medications   cephALEXin 500 MG capsule Commonly known as:  KEFLEX     TAKE these medications   acetaminophen 650 MG CR tablet Commonly known as:  TYLENOL Take 650 mg by mouth every 8 (eight) hours as needed for pain.   ALPRAZolam 0.5 MG tablet Commonly known as:  XANAX Take 0.5  mg by mouth 3 (three) times daily as needed for anxiety.   aspirin 81 MG EC tablet Take 1 tablet (81 mg total) by mouth daily.   atorvastatin 80 MG tablet Commonly known as:  LIPITOR Take 1 tablet (80 mg total) by mouth daily at 6 PM.   fish oil-omega-3 fatty acids 1000 MG capsule Take 1 g by mouth daily.   folic acid 1 MG  tablet Commonly known as:  FOLVITE Take 1 mg by mouth daily.   glipiZIDE 10 MG 24 hr tablet Commonly known as:  GLUCOTROL XL Take 10 mg by mouth 2 (two) times daily.   isosorbide mononitrate 120 MG 24 hr tablet Commonly known as:  IMDUR Take 120 mg by mouth daily.   LANTUS SOLOSTAR 100 UNIT/ML Solostar Pen Generic drug:  Insulin Glargine Inject 80 Units into the skin daily at 10 pm.   magnesium oxide 400 MG tablet Commonly known as:  MAG-OX Take 400 mg by mouth 2 (two) times daily.   metFORMIN 1000 MG tablet Commonly known as:  GLUCOPHAGE Take 1 tablet (1,000 mg total) by mouth 2 (two) times daily with a meal.   Methotrexate 2.5 MG/ML Soln Take 1 mL by mouth once a week. THURSDAYS   metoprolol tartrate 25 MG tablet Commonly known as:  LOPRESSOR TAKE 1 TABLET (25 MG TOTAL) BY MOUTH 2 (TWO) TIMES DAILY. PLEASE CONTACT OFFICE FOR ADDITIONAL REFILLS What changed:  additional instructions   nitroGLYCERIN 0.4 MG SL tablet Commonly known as:  NITROSTAT Place 1 tablet (0.4 mg total) under the tongue every 5 (five) minutes x 3 doses as needed for chest pain.   oxyCODONE 5 MG immediate release tablet Commonly known as:  Oxy IR/ROXICODONE Take 1 tablet (5 mg total) by mouth every 6 (six) hours as needed for moderate pain.   primidone 50 MG tablet Commonly known as:  MYSOLINE Take 50 mg by mouth daily.   quinapril 40 MG tablet Commonly known as:  ACCUPRIL Take 20 mg by mouth at bedtime. IN CONJUNCTION WITH ONE 10 MG TABLET TO EQUAL A TOTAL OF 30 MILLIGRAMS   quinapril 10 MG tablet Commonly known as:  ACCUPRIL Take 10 mg by mouth at bedtime. IN CONJUNCTION WITH ONE-HALF (20 MG) OF A 40 MG TABLET TO EQUAL A TOTAL OF 30 MILLIGRAMS   Tocilizumab 162 MG/0.9ML Sosy Inject 162 mg into the skin once a week. ACTEMRA            Durable Medical Equipment        Start     Ordered   12/13/16 1343  For home use only DME 3 n 1  Once     12/13/16 1344   12/13/16 1343  For home  use only DME Walker platform  Once    Question:  Patient needs a walker to treat with the following condition  Answer:  Traumatic compression fracture of T3 thoracic vertebra ([place])   12/13/16 1344   12/13/16 1341  For home use only DME oxygen  Once    Question Answer Comment  Mode or (Route) Nasal cannula   Liters per Minute 2   Frequency Continuous (stationary and portable oxygen unit needed)   Oxygen conserving device Yes   Oxygen delivery system Gas      12/13/16 1344       Follow-up Information    STERN,JOSEPH D, MD Follow up in 1 month(s).   Specialty:  Neurosurgery Why:  for follow up with xrays Contact information: [address]  [place] [address] [phone]        [place]. Call.   Why:  as needed Contact information: [place] [address] [place], [place]. Schedule an appointment as soon as possible for a visit in 2 week(s).   Specialty:  Orthopedic Surgery Contact information: [address] [place] [address] [phone]           Signed: [facility] 12/13/2016, 2:46 PM Pager: [phone] Consults: [phone] Mon-Fri 7:00 am-4:30 pm Sat-Sun 7:00 am-11:30 am

## 2016-12-13 NOTE — Progress Notes (Signed)
Results for NITISH, ROES (MRN [id number]) as of 12/13/2016 10:14  Ref. Range 12/12/2016 12:03 12/12/2016 16:43 12/12/2016 22:07 12/13/2016 06:22  Glucose-Capillary Latest Ref Range: 65 - 99 mg/dL 272 (H) 317 (H) 276 (H) 259 (H)  Noted that blood sugars continue to be greater than 180 mg/dl. Recommend increasing Lantus dosage to 28-30  units daily if blood sugars continue to be elevated. Continue Novolog RESISTANT correction scale as ordered.   Harvel Ricks RN BSN CDE Diabetes Coordinator Pager: [phone]  8am-5pm

## 2016-12-13 NOTE — Progress Notes (Signed)
SATURATION QUALIFICATIONS: (This note is used to comply with regulatory documentation for home oxygen)  Patient Saturations on Room Air at Rest = 87%  Patient Saturations on Room Air while Ambulating = 79-84%  Patient Saturations on 1L Liters of oxygen while Ambulating = 88-90%  Please briefly explain why patient needs home oxygen: pt experiences SpO2 desat on RA and especially with mobility  Earney Navy, PTA Pager: [phone]

## 2016-12-13 NOTE — Discharge Instructions (Signed)
Office f/u with Dr Vertell Limber in 1 month with x-rays. Mobilize as tolerated  Ice and elevation and nonweightbearing of your the right upper extremity. You will need to follow up with Dr. Caralyn Guile in approximately 2 weeks. At that point they will take your splint off, re-x-ray the wrist and likely place him into a short arm cast. We'll put an outpatient therapy order for you at the first postoperative visit and then begin a postoperative ORIF protocol to 4 week mark. Radiographs of the wrist at each visit.   1. Take your usually prescribed home medications unless otherwise directed. 2. PAIN CONTROL:  1. Pain is best controlled by a usual combination of three different methods TOGETHER:  1. Ice/Heat 2. Over the counter pain medication 3. Prescription pain medication 2. It is helpful to take an over-the-counter pain medication regularly for the first few weeks. Choose one of the following that works best for you:  1. Naproxen (Aleve, etc) Two 220mg  tabs twice a day 2. Ibuprofen (Advil, etc) Three 200mg  tabs four times a day (every meal & bedtime) 3. Acetaminophen (Tylenol, etc) 500-650mg  four times a day (every meal & bedtime) 3. A prescription for pain medication (such as oxycodone, hydrocodone, etc) should be given to you upon discharge. Take your pain medication as prescribed.  1. If you are having problems/concerns with the prescription medicine (does not control pain, nausea, vomiting, rash, itching, etc), please call us [phone] to see if we need to switch you to a different pain medicine that will work better for you and/or control your side effect better. 2. If you need a refill on your pain medication, please contact your pharmacy. They will contact our office to request authorization. Prescriptions will not be filled after 5 pm or on week-ends. 4. Avoid getting constipated. Between the surgery and the pain medications, it is common to experience some constipation. Increasing fluid intake and  taking a fiber supplement (such as Metamucil, Citrucel, FiberCon, MiraLax, etc) 1-2 times a day regularly will usually help prevent this problem from occurring. A mild laxative (prune juice, Milk of Magnesia, MiraLax, etc) should be taken according to package directions if there are no bowel movements after 48 hours.   WHEN TO CALL us [phone]:  1. Poor pain control 2. Reactions / problems with new medications (rash/itching, nausea, etc)  3. Fever over 101.5 F (38.5 C) 4. Inability to urinate 5. Nausea and/or vomiting 6. Worsening swelling or bruising  The clinic staff is available to answer your questions during regular business hours (8:30am-5pm). Please dont hesitate to call and ask to speak to one of our nurses for clinical concerns.  If you have a medical emergency, go to the nearest emergency room or call 911.  A surgeon from [facility] is always on call at the [facility], [place], [place], [place], [place] [address] ?  MAIN: [phone] ? TOLL FREE: [phone] ?  FAX [phone]  [email or web address]

## 2016-12-13 NOTE — Care Management Important Message (Signed)
Important Message  Patient Details  Name: Robert [name] MRN: [id number] Date of Birth: 1951/02/25   Medicare Important Message Given:  Yes    Orbie Pyo 12/13/2016, 12:23 PM

## 2016-12-13 NOTE — Progress Notes (Signed)
[facility] Progress Note  3 Days Post-Op  Subjective: Pt states back pain this morning but is now improved. He states he has a hx of COPD, CHF and has been sent home on O2 in the past when hospitalized with previous cardio issues. He is not currently on home O2. He states he slipped and did not have LOC prior to his fall. He has not been having dizziness at home. No SOB here and no leg swelling. Doesn't take his diuretics unless he has LE edema.    Objective: Vital signs in last 24 hours: Temp:  [97.6 F (36.4 C)-98.1 F (36.7 C)] 97.6 F (36.4 C) (04/04 0518) Pulse Rate:  [80-96] 80 (04/04 0518) Resp:  [18] 18 (04/04 0518) BP: (136-144)/(61-82) 136/61 (04/04 0518) SpO2:  [92 %-96 %] 95 % (04/04 0518) Last BM Date: 12/09/16  Intake/Output from previous day: 04/03 0701 - 04/04 0700 In: 720 [P.O.:720] Out: 1001 [Urine:1001] Intake/Output this shift: No intake/output data recorded.  PE: Gen: Alert, NAD, pleasant, cooperative Neck: extended brace in place Card: RRR, no M/G/R heard Pulm: CTA, no increased work of breathing, rate and effort normal, [place] in place Skin: no rashes noted, warm and dry Ext: RUE with splint in place, sensation intact, brisk cap refill Neuro: A&O x 3, appropriate mood  Lab Results:  No results for input(s): WBC, HGB, HCT, PLT in the last 72 hours. BMET No results for input(s): NA, K, CL, CO2, GLUCOSE, BUN, CREATININE, CALCIUM in the last 72 hours. PT/INR No results for input(s): LABPROT, INR in the last 72 hours. CMP     Component Value Date/Time   NA 135 12/10/2016 0500   NA 135 01/27/2015 0936   K 3.9 12/10/2016 0500   CL 102 12/10/2016 0500   CO2 25 12/10/2016 0500   GLUCOSE 180 (H) 12/10/2016 0500   BUN 13 12/10/2016 0500   BUN 21 01/27/2015 0936   CREATININE 0.93 12/10/2016 0500   CALCIUM 8.6 (L) 12/10/2016 0500   PROT 6.0 (L) 12/10/2016 0500   PROT 7.4 01/27/2015 0936   ALBUMIN 3.6 12/10/2016 0500   ALBUMIN 4.4  01/27/2015 0936   AST 42 (H) 12/10/2016 0500   ALT 34 12/10/2016 0500   ALKPHOS 62 12/10/2016 0500   BILITOT 1.0 12/10/2016 0500   BILITOT <0.2 01/27/2015 0936   GFRNONAA >60 12/10/2016 0500   GFRAA >60 12/10/2016 0500   Lipase  No results found for: LIPASE     Studies/Results: No results found.  Anti-infectives: Anti-infectives    Start     Dose/Rate Route Frequency Ordered Stop   12/10/16 1600  ceFAZolin (ANCEF) IVPB 1 g/50 mL premix     1 g 100 mL/hr over 30 Minutes Intravenous Every 8 hours 12/10/16 1112 12/11/16 0900   12/10/16 1400  ceFAZolin (ANCEF) IVPB 1 g/50 mL premix  Status:  Discontinued     1 g 100 mL/hr over 30 Minutes Intravenous Every 8 hours 12/10/16 1110 12/10/16 1112   12/10/16 0600  ceFAZolin (ANCEF) IVPB 2g/100 mL premix  Status:  Discontinued     2 g 200 mL/hr over 30 Minutes Intravenous On call to O.R. 12/09/16 1624 12/10/16 1055       Assessment/Plan s/p fall L frontal skull fracture T3 comminuted Fxwith loss of height- immobilization in Cervical collar with thoracic extension (SOMI) brace. This should heal and not require surgery. Outpt neurosurgery f/u with serial X rays. Immobilization for there next 6 - 8 weeks.  neurosurgery following Comminuted Rt radius  fx- S/PORIF 12/10/16, Dr. Caralyn Guile: nonweightbearing, follow up in 2 weeks for possible cast.  Multiple abrasions HTN CAD CHF COPD DM2 obese on insulin Hx of Tobacco use RA Morbid obesity O2 requirement: pt desating to low 80's on RA when ambulating. PT recommending home O2  FEN: carb modified diet VTE: lovenox ID: pre-op only  Dispo: OT/PT recommending HHPT, 24 hour supervision. Home O2?    LOS: 4 days    Kalman Drape , [facility] 12/13/2016, 8:22 AM Pager: [phone] Consults: [phone] Mon-Fri 7:00 am-4:30 pm Sat-Sun 7:00 am-11:30 am

## 2016-12-13 NOTE — Progress Notes (Signed)
qPhysical Therapy Treatment Patient Details Name: Robert [name] MRN: [id number] DOB: [birth year] Today's Date: 12/13/2016    History of Present Illness 66 year old morbidly obese male with hypertension, coronary artery disease, GERD, previous mellitus type II, rheumatoid arthritis fell down his basement stairs. He was taken to [facility] and evaluated and found to have a T3 compression fracture, skull fracture, and right wrist fracture. Transfer was requested to our trauma center. Now s/p     PT Comments    Patient continues to do well with mobility and overall required min guard/min A for transfers and safe ambulation. Pt continues to experience SpO2 desat: at rest on RA in upper 80s, on RA while ambulating down to 79%, on 1L O2 while ambulating up to 90%, and at rest on 1L O2 up to 95%. Current plan remains appropriate.    Follow Up Recommendations  Home health PT;Supervision/Assistance - 24 hour     Equipment Recommendations  Rolling walker with 5" wheels;3in1 (PT) (right platform)    Recommendations for Other Services       Precautions / Restrictions Precautions Precautions: Fall;Other (comment) (T3 fx, in extended neck brace at all times) Precaution Comments: Went over no BLT Required Braces or Orthoses: Other Brace/Splint (extended neck brace ) Other Brace/Splint: soft cast R UE Restrictions Weight Bearing Restrictions: Yes RUE Weight Bearing: Weight bear through elbow only    Mobility  Bed Mobility               General bed mobility comments: pt OOB in chair upon arrival  Transfers Overall transfer level: Needs assistance Equipment used: Right platform walker Transfers: Sit to/from Stand Sit to Stand: Min assist         General transfer comment: assist to steady when transitioning hand placement from armrests to platform RW; carry over of safe hand placement and technique  Ambulation/Gait Ambulation/Gait assistance: Min guard Ambulation Distance  (Feet): 160 Feet Assistive device: Right platform walker Gait Pattern/deviations: Decreased stride length;Step-through pattern Gait velocity: decreased   General Gait Details: pt with safe use of AD; slow, steady gait; SpO2 desat on RA to 79-84% and up 88-90% on 1L O2 via [place] while ambulating   Stairs            Wheelchair Mobility    Modified Rankin (Stroke Patients Only)       Balance Overall balance assessment: History of Falls;Needs assistance Sitting-balance support: Single extremity supported;Feet supported Sitting balance-Leahy Scale: Fair     Standing balance support: Bilateral upper extremity supported;During functional activity Standing balance-Leahy Scale: Poor Standing balance comment: Required Right platform walker and Minguard for functional mobility                            Cognition Arousal/Alertness: Awake/alert Behavior During Therapy: WFL for tasks assessed/performed Overall Cognitive Status: Within Functional Limits for tasks assessed                                        Exercises      General Comments General comments (skin integrity, edema, etc.): SpO2 at rest on RA in 80s and at rest with 1L O2 up to 95%      Pertinent Vitals/Pain Pain Assessment: Faces (Simultaneous filing. User may not have seen previous data.) Faces Pain Scale: Hurts a little bit (Simultaneous filing. User may not have seen  previous data.) Pain Location: with transitional movements and coughing (Simultaneous filing. User may not have seen previous data.) Pain Descriptors / Indicators: Grimacing;Sore;Guarding (Simultaneous filing. User may not have seen previous data.) Pain Intervention(s): Limited activity within patient's tolerance;Monitored during session;Premedicated before session;Repositioned (Simultaneous filing. User may not have seen previous data.)    Home Living                      Prior Function            PT  Goals (current goals can now be found in the care plan section) Acute Rehab PT Goals Patient Stated Goal: home today PT Goal Formulation: With patient/family Time For Goal Achievement: 12/25/16 Potential to Achieve Goals: Good Progress towards PT goals: Progressing toward goals    Frequency    Min 6X/week      PT Plan Current plan remains appropriate    Co-evaluation             End of Session Equipment Utilized During Treatment: Gait belt;Oxygen;Back brace;Cervical collar (1L O2 via [place]) Activity Tolerance: Patient tolerated treatment well Patient left: with call bell/phone within reach;with family/visitor present;in chair Nurse Communication: Mobility status PT Visit Diagnosis: Unsteadiness on feet (R26.81);Muscle weakness (generalized) (M62.81);Pain Pain - Right/Left: Right Pain - part of body: Arm     Time: 7681-1572 PT Time Calculation (min) (ACUTE ONLY): 22 min  Charges:  $Gait Training: 8-22 mins                    G Codes:       Earney Navy, PTA Pager: [phone]     Darliss Cheney 12/13/2016, 1:07 PM

## 2016-12-13 NOTE — Care Management Note (Signed)
Case Management Note  Patient Details  Name: Robert [name] MRN: [id number] Date of Birth: [birth year]  Subjective/Objective:    Pt admitted on 12/09/16 s/p fall down basement stairs with T3 compression fx, skull fx, and Rt wrist fx.  PTA, pt independent and living at home with spouse.                  Action/Plan: PT/OT recommending HH follow up and DME.  Pt will also need home oxygen, as desats on room air.  Referral to [facility] for [facility] and DME needs, per pt/wife choice.  Start of care 24-48h post dc date.    Expected Discharge Date:  12/13/16               Expected Discharge Plan:  [place]  In-House Referral:     Discharge planning Services  CM Consult  Post Acute Care Choice:  Durable Medical Equipment, Home Health Choice offered to:  Patient, Spouse  DME Arranged:  3-N-1, Walker platform, Oxygen DME Agency:  [place] Arranged:  PT, OT [place] Agency:  [place]  Status of Service:  Completed, signed off  If discussed at [place] of Stay Meetings, dates discussed:    Additional Comments:  Reinaldo Raddle, RN, BSN  Trauma/Neuro ICU Case Manager [phone]

## 2016-12-15 ENCOUNTER — Other Ambulatory Visit: Payer: Self-pay | Admitting: *Deleted

## 2016-12-15 ENCOUNTER — Ambulatory Visit (HOSPITAL_COMMUNITY)
Admission: RE | Admit: 2016-12-15 | Discharge: 2016-12-15 | Disposition: A | Discharge status: Home / Self Care | Payer: Medicare HMO | Source: Ambulatory Visit | Attending: Family Medicine | Admitting: Family Medicine

## 2016-12-15 ENCOUNTER — Other Ambulatory Visit (HOSPITAL_COMMUNITY): Payer: Self-pay | Admitting: Family Medicine

## 2016-12-15 DIAGNOSIS — S92422A Displaced fracture of distal phalanx of left great toe, initial encounter for closed fracture: Secondary | ICD-10-CM | POA: Diagnosis not present

## 2016-12-15 DIAGNOSIS — S90932A Unspecified superficial injury of left great toe, initial encounter: Secondary | ICD-10-CM | POA: Diagnosis not present

## 2016-12-15 DIAGNOSIS — S92425A Nondisplaced fracture of distal phalanx of left great toe, initial encounter for closed fracture: Secondary | ICD-10-CM | POA: Diagnosis not present

## 2016-12-15 DIAGNOSIS — Z681 Body mass index (BMI) 19 or less, adult: Secondary | ICD-10-CM | POA: Diagnosis not present

## 2016-12-15 DIAGNOSIS — S90932D Unspecified superficial injury of left great toe, subsequent encounter: Secondary | ICD-10-CM | POA: Diagnosis not present

## 2016-12-15 DIAGNOSIS — E1140 Type 2 diabetes mellitus with diabetic neuropathy, unspecified: Secondary | ICD-10-CM | POA: Diagnosis not present

## 2016-12-15 DIAGNOSIS — I10 Essential (primary) hypertension: Secondary | ICD-10-CM | POA: Diagnosis not present

## 2016-12-15 DIAGNOSIS — M069 Rheumatoid arthritis, unspecified: Secondary | ICD-10-CM | POA: Diagnosis not present

## 2016-12-15 DIAGNOSIS — W19XXXA Unspecified fall, initial encounter: Secondary | ICD-10-CM | POA: Insufficient documentation

## 2016-12-15 DIAGNOSIS — S91122A Laceration with foreign body of left great toe without damage to nail, initial encounter: Secondary | ICD-10-CM | POA: Diagnosis not present

## 2016-12-15 MED ORDER — AMBULATORY NON FORMULARY MEDICATION: 600.0000 mg | Freq: Every day | Status: DC

## 2016-12-22 DIAGNOSIS — S62021D Displaced fracture of middle third of navicular [scaphoid] bone of right wrist, subsequent encounter for fracture with routine healing: Secondary | ICD-10-CM | POA: Diagnosis not present

## 2017-01-03 DIAGNOSIS — S22030D Wedge compression fracture of third thoracic vertebra, subsequent encounter for fracture with routine healing: Secondary | ICD-10-CM | POA: Diagnosis not present

## 2017-01-03 DIAGNOSIS — I10 Essential (primary) hypertension: Secondary | ICD-10-CM | POA: Diagnosis not present

## 2017-01-03 DIAGNOSIS — Z6834 Body mass index (BMI) 34.0-34.9, adult: Secondary | ICD-10-CM | POA: Diagnosis not present

## 2017-01-05 DIAGNOSIS — S62021D Displaced fracture of middle third of navicular [scaphoid] bone of right wrist, subsequent encounter for fracture with routine healing: Secondary | ICD-10-CM | POA: Diagnosis not present

## 2017-01-10 DIAGNOSIS — M2550 Pain in unspecified joint: Secondary | ICD-10-CM | POA: Diagnosis not present

## 2017-01-10 DIAGNOSIS — E669 Obesity, unspecified: Secondary | ICD-10-CM | POA: Diagnosis not present

## 2017-01-10 DIAGNOSIS — Z79899 Other long term (current) drug therapy: Secondary | ICD-10-CM | POA: Diagnosis not present

## 2017-01-10 DIAGNOSIS — Z6834 Body mass index (BMI) 34.0-34.9, adult: Secondary | ICD-10-CM | POA: Diagnosis not present

## 2017-01-10 DIAGNOSIS — M5441 Lumbago with sciatica, right side: Secondary | ICD-10-CM | POA: Diagnosis not present

## 2017-01-10 DIAGNOSIS — M0579 Rheumatoid arthritis with rheumatoid factor of multiple sites without organ or systems involvement: Secondary | ICD-10-CM | POA: Diagnosis not present

## 2017-01-12 DIAGNOSIS — J449 Chronic obstructive pulmonary disease, unspecified: Secondary | ICD-10-CM | POA: Diagnosis not present

## 2017-01-29 DIAGNOSIS — S62021D Displaced fracture of middle third of navicular [scaphoid] bone of right wrist, subsequent encounter for fracture with routine healing: Secondary | ICD-10-CM | POA: Diagnosis not present

## 2017-01-31 DIAGNOSIS — S22030D Wedge compression fracture of third thoracic vertebra, subsequent encounter for fracture with routine healing: Secondary | ICD-10-CM | POA: Diagnosis not present

## 2017-01-31 DIAGNOSIS — I10 Essential (primary) hypertension: Secondary | ICD-10-CM | POA: Diagnosis not present

## 2017-01-31 DIAGNOSIS — Z6834 Body mass index (BMI) 34.0-34.9, adult: Secondary | ICD-10-CM | POA: Diagnosis not present

## 2017-02-10 NOTE — Addendum Note (Signed)
Addendum  created 02/10/17 0759 by Duane Boston, MD   Sign clinical note

## 2017-02-12 DIAGNOSIS — J449 Chronic obstructive pulmonary disease, unspecified: Secondary | ICD-10-CM | POA: Diagnosis not present

## 2017-02-14 DIAGNOSIS — Z1389 Encounter for screening for other disorder: Secondary | ICD-10-CM | POA: Diagnosis not present

## 2017-02-14 DIAGNOSIS — M0689 Other specified rheumatoid arthritis, multiple sites: Secondary | ICD-10-CM | POA: Diagnosis not present

## 2017-02-14 DIAGNOSIS — I10 Essential (primary) hypertension: Secondary | ICD-10-CM | POA: Diagnosis not present

## 2017-02-14 DIAGNOSIS — E119 Type 2 diabetes mellitus without complications: Secondary | ICD-10-CM | POA: Diagnosis not present

## 2017-02-14 DIAGNOSIS — J9801 Acute bronchospasm: Secondary | ICD-10-CM | POA: Diagnosis not present

## 2017-02-14 DIAGNOSIS — R600 Localized edema: Secondary | ICD-10-CM | POA: Diagnosis not present

## 2017-02-14 DIAGNOSIS — J449 Chronic obstructive pulmonary disease, unspecified: Secondary | ICD-10-CM | POA: Diagnosis not present

## 2017-02-14 DIAGNOSIS — Z6834 Body mass index (BMI) 34.0-34.9, adult: Secondary | ICD-10-CM | POA: Diagnosis not present

## 2017-02-19 ENCOUNTER — Other Ambulatory Visit: Payer: Self-pay | Admitting: Cardiovascular Disease

## 2017-03-03 ENCOUNTER — Encounter (HOSPITAL_COMMUNITY): Payer: Self-pay | Admitting: Emergency Medicine

## 2017-03-03 ENCOUNTER — Inpatient Hospital Stay (HOSPITAL_COMMUNITY)
Admission: EM | Admit: 2017-03-03 | Discharge: 2017-03-05 | DRG: 603 | Disposition: A | Discharge status: Home Health | Payer: Medicare HMO | Attending: Internal Medicine | Admitting: Internal Medicine

## 2017-03-03 DIAGNOSIS — M069 Rheumatoid arthritis, unspecified: Secondary | ICD-10-CM | POA: Diagnosis present

## 2017-03-03 DIAGNOSIS — Z8249 Family history of ischemic heart disease and other diseases of the circulatory system: Secondary | ICD-10-CM | POA: Diagnosis not present

## 2017-03-03 DIAGNOSIS — Z955 Presence of coronary angioplasty implant and graft: Secondary | ICD-10-CM | POA: Diagnosis not present

## 2017-03-03 DIAGNOSIS — K219 Gastro-esophageal reflux disease without esophagitis: Secondary | ICD-10-CM | POA: Diagnosis present

## 2017-03-03 DIAGNOSIS — L03116 Cellulitis of left lower limb: Principal | ICD-10-CM | POA: Diagnosis present

## 2017-03-03 DIAGNOSIS — I2510 Atherosclerotic heart disease of native coronary artery without angina pectoris: Secondary | ICD-10-CM | POA: Diagnosis present

## 2017-03-03 DIAGNOSIS — Z972 Presence of dental prosthetic device (complete) (partial): Secondary | ICD-10-CM

## 2017-03-03 DIAGNOSIS — Z888 Allergy status to other drugs, medicaments and biological substances status: Secondary | ICD-10-CM | POA: Diagnosis not present

## 2017-03-03 DIAGNOSIS — E785 Hyperlipidemia, unspecified: Secondary | ICD-10-CM | POA: Diagnosis present

## 2017-03-03 DIAGNOSIS — I252 Old myocardial infarction: Secondary | ICD-10-CM | POA: Diagnosis not present

## 2017-03-03 DIAGNOSIS — Z9049 Acquired absence of other specified parts of digestive tract: Secondary | ICD-10-CM | POA: Diagnosis not present

## 2017-03-03 DIAGNOSIS — Z794 Long term (current) use of insulin: Secondary | ICD-10-CM

## 2017-03-03 DIAGNOSIS — J449 Chronic obstructive pulmonary disease, unspecified: Secondary | ICD-10-CM | POA: Diagnosis present

## 2017-03-03 DIAGNOSIS — F17210 Nicotine dependence, cigarettes, uncomplicated: Secondary | ICD-10-CM | POA: Diagnosis present

## 2017-03-03 DIAGNOSIS — Z91041 Radiographic dye allergy status: Secondary | ICD-10-CM

## 2017-03-03 DIAGNOSIS — E119 Type 2 diabetes mellitus without complications: Secondary | ICD-10-CM

## 2017-03-03 DIAGNOSIS — E1159 Type 2 diabetes mellitus with other circulatory complications: Secondary | ICD-10-CM

## 2017-03-03 DIAGNOSIS — I739 Peripheral vascular disease, unspecified: Secondary | ICD-10-CM | POA: Diagnosis not present

## 2017-03-03 DIAGNOSIS — Z79899 Other long term (current) drug therapy: Secondary | ICD-10-CM | POA: Diagnosis not present

## 2017-03-03 DIAGNOSIS — F419 Anxiety disorder, unspecified: Secondary | ICD-10-CM | POA: Diagnosis present

## 2017-03-03 DIAGNOSIS — I878 Other specified disorders of veins: Secondary | ICD-10-CM | POA: Diagnosis not present

## 2017-03-03 DIAGNOSIS — D696 Thrombocytopenia, unspecified: Secondary | ICD-10-CM | POA: Diagnosis not present

## 2017-03-03 DIAGNOSIS — I10 Essential (primary) hypertension: Secondary | ICD-10-CM | POA: Diagnosis not present

## 2017-03-03 DIAGNOSIS — Z7982 Long term (current) use of aspirin: Secondary | ICD-10-CM

## 2017-03-03 DIAGNOSIS — I255 Ischemic cardiomyopathy: Secondary | ICD-10-CM | POA: Diagnosis present

## 2017-03-03 DIAGNOSIS — M79605 Pain in left leg: Secondary | ICD-10-CM

## 2017-03-03 DIAGNOSIS — Z9861 Coronary angioplasty status: Secondary | ICD-10-CM

## 2017-03-03 DIAGNOSIS — E1151 Type 2 diabetes mellitus with diabetic peripheral angiopathy without gangrene: Secondary | ICD-10-CM | POA: Diagnosis not present

## 2017-03-03 HISTORY — DX: Cellulitis of left lower limb: L03.116

## 2017-03-03 LAB — BASIC METABOLIC PANEL
ANION GAP: 8 (ref 5–15)
BUN: 15 mg/dL (ref 6–20)
CHLORIDE: 103 mmol/L (ref 101–111)
CO2: 26 mmol/L (ref 22–32)
Calcium: 9 mg/dL (ref 8.9–10.3)
Creatinine, Ser: 0.88 mg/dL (ref 0.61–1.24)
GFR calc Af Amer: >60 mL/min (ref >60)
Glucose, Bld: 225 mg/dL — ABNORMAL HIGH (ref 65–99)
POTASSIUM: 3.9 mmol/L (ref 3.5–5.1)
SODIUM: 137 mmol/L (ref 135–145)

## 2017-03-03 LAB — CBC WITH DIFFERENTIAL/PLATELET
BASOS ABS: 0 10*3/uL (ref 0.0–0.1)
BASOS PCT: 1 %
Eosinophils Absolute: 0.2 10*3/uL (ref 0.0–0.7)
Eosinophils Relative: 3 %
HEMATOCRIT: 49.3 % (ref 39.0–52.0)
Hemoglobin: 17 g/dL (ref 13.0–17.0)
LYMPHS PCT: 23 %
Lymphs Abs: 1.5 10*3/uL (ref 0.7–4.0)
MCH: 33.3 pg (ref 26.0–34.0)
MCHC: 34.5 g/dL (ref 30.0–36.0)
MCV: 96.7 fL (ref 78.0–100.0)
Monocytes Absolute: 0.5 10*3/uL (ref 0.1–1.0)
Monocytes Relative: 8 %
Neutro Abs: 4.4 10*3/uL (ref 1.7–7.7)
Neutrophils Relative %: 66 %
Platelets: 99 10*3/uL — ABNORMAL LOW (ref 150–400)
RBC: 5.1 MIL/uL (ref 4.22–5.81)
RDW: 15.3 % (ref 11.5–15.5)
WBC: 6.7 10*3/uL (ref 4.0–10.5)

## 2017-03-03 LAB — LACTIC ACID, PLASMA: LACTIC ACID, VENOUS: 1.2 mmol/L (ref 0.5–1.9)

## 2017-03-03 LAB — GLUCOSE, CAPILLARY: GLUCOSE-CAPILLARY: 254 mg/dL — AB (ref 65–99)

## 2017-03-03 MED ORDER — ACETAMINOPHEN 325 MG PO TABS: 650.0000 mg | ORAL_TABLET | Freq: Four times a day (QID) | ORAL | Status: DC | PRN

## 2017-03-03 MED ORDER — ASPIRIN 81 MG PO TBEC: 81.0000 mg | DELAYED_RELEASE_TABLET | Freq: Every day | ORAL | Status: DC

## 2017-03-03 MED ORDER — INSULIN ASPART 100 UNIT/ML ~~LOC~~ SOLN
0.0000 [IU] | Freq: Every day | SUBCUTANEOUS | Status: DC
  Administered 2017-03-03: 3 [IU] via SUBCUTANEOUS
  Administered 2017-03-04: 5 [IU] via SUBCUTANEOUS

## 2017-03-03 MED ORDER — ENOXAPARIN SODIUM 40 MG/0.4ML ~~LOC~~ SOLN: 40.0000 mg | SUBCUTANEOUS | Status: DC

## 2017-03-03 MED ORDER — VANCOMYCIN HCL IN DEXTROSE 1-5 GM/200ML-% IV SOLN
1000.0000 mg | Freq: Once | INTRAVENOUS | Status: AC
  Administered 2017-03-03: 1000 mg via INTRAVENOUS
  Filled 2017-03-03: qty 200

## 2017-03-03 MED ORDER — ISOSORBIDE MONONITRATE ER 60 MG PO TB24
120.0000 mg | ORAL_TABLET | Freq: Every day | ORAL | Status: DC
  Administered 2017-03-03 – 2017-03-05 (×3): 120 mg via ORAL
  Filled 2017-03-03 (×3): qty 2

## 2017-03-03 MED ORDER — ATORVASTATIN CALCIUM 40 MG PO TABS
80.0000 mg | ORAL_TABLET | Freq: Every day | ORAL | Status: DC
  Administered 2017-03-03 – 2017-03-04 (×2): 80 mg via ORAL
  Filled 2017-03-03 (×2): qty 1
  Filled 2017-03-03 (×2): qty 2

## 2017-03-03 MED ORDER — ACETAMINOPHEN 650 MG RE SUPP: 650.0000 mg | Freq: Four times a day (QID) | RECTAL | Status: DC | PRN

## 2017-03-03 MED ORDER — INSULIN GLARGINE 100 UNIT/ML SOLOSTAR PEN: 80.0000 [IU] | PEN_INJECTOR | Freq: Every day | SUBCUTANEOUS | Status: DC

## 2017-03-03 MED ORDER — ALPRAZOLAM 0.5 MG PO TABS: 0.5000 mg | ORAL_TABLET | Freq: Three times a day (TID) | ORAL | Status: DC | PRN

## 2017-03-03 MED ORDER — INSULIN GLARGINE 100 UNIT/ML ~~LOC~~ SOLN
80.0000 [IU] | Freq: Every day | SUBCUTANEOUS | Status: DC
  Administered 2017-03-03 – 2017-03-04 (×2): 80 [IU] via SUBCUTANEOUS
  Filled 2017-03-03 (×3): qty 0.8

## 2017-03-03 MED ORDER — ASPIRIN EC 81 MG PO TBEC
81.0000 mg | DELAYED_RELEASE_TABLET | Freq: Every day | ORAL | Status: DC
  Administered 2017-03-03 – 2017-03-05 (×3): 81 mg via ORAL
  Filled 2017-03-03 (×3): qty 1

## 2017-03-03 MED ORDER — ONDANSETRON HCL 4 MG PO TABS: 4.0000 mg | ORAL_TABLET | Freq: Four times a day (QID) | ORAL | Status: DC | PRN

## 2017-03-03 MED ORDER — ONDANSETRON HCL 4 MG/2ML IJ SOLN: 4.0000 mg | Freq: Four times a day (QID) | INTRAMUSCULAR | Status: DC | PRN

## 2017-03-03 MED ORDER — METOPROLOL TARTRATE 25 MG PO TABS
25.0000 mg | ORAL_TABLET | Freq: Two times a day (BID) | ORAL | Status: DC
  Administered 2017-03-03 – 2017-03-05 (×4): 25 mg via ORAL
  Filled 2017-03-03 (×4): qty 1

## 2017-03-03 MED ORDER — METFORMIN HCL 500 MG PO TABS
1000.0000 mg | ORAL_TABLET | Freq: Two times a day (BID) | ORAL | Status: DC
  Administered 2017-03-04: 1000 mg via ORAL
  Filled 2017-03-03: qty 2

## 2017-03-03 MED ORDER — LISINOPRIL 10 MG PO TABS
30.0000 mg | ORAL_TABLET | Freq: Every day | ORAL | Status: DC
  Administered 2017-03-03 – 2017-03-05 (×3): 30 mg via ORAL
  Filled 2017-03-03 (×3): qty 3

## 2017-03-03 MED ORDER — FUROSEMIDE 40 MG PO TABS
20.0000 mg | ORAL_TABLET | Freq: Every day | ORAL | Status: DC
  Administered 2017-03-04 – 2017-03-05 (×2): 20 mg via ORAL
  Filled 2017-03-03 (×2): qty 1

## 2017-03-03 MED ORDER — CEFAZOLIN SODIUM-DEXTROSE 2-4 GM/100ML-% IV SOLN
2.0000 g | Freq: Three times a day (TID) | INTRAVENOUS | Status: DC
  Administered 2017-03-03 – 2017-03-05 (×5): 2 g via INTRAVENOUS
  Filled 2017-03-03 (×10): qty 100

## 2017-03-03 MED ORDER — CEFAZOLIN SODIUM-DEXTROSE 2-4 GM/100ML-% IV SOLN
2.0000 g | Freq: Three times a day (TID) | INTRAVENOUS | Status: DC
  Filled 2017-03-03 (×6): qty 100

## 2017-03-03 MED ORDER — INSULIN ASPART 100 UNIT/ML ~~LOC~~ SOLN
0.0000 [IU] | Freq: Three times a day (TID) | SUBCUTANEOUS | Status: DC
  Administered 2017-03-04 – 2017-03-05 (×3): 5 [IU] via SUBCUTANEOUS

## 2017-03-03 MED ORDER — CEFAZOLIN SODIUM-DEXTROSE 2-4 GM/100ML-% IV SOLN
INTRAVENOUS | Status: AC
  Filled 2017-03-03: qty 100

## 2017-03-03 MED ORDER — GLIPIZIDE 10 MG PO TABS
10.0000 mg | ORAL_TABLET | Freq: Two times a day (BID) | ORAL | Status: DC
  Administered 2017-03-03: 10 mg via ORAL
  Filled 2017-03-03: qty 2
  Filled 2017-03-03 (×3): qty 1
  Filled 2017-03-03: qty 2
  Filled 2017-03-03: qty 1

## 2017-03-03 MED ORDER — QUINAPRIL HCL 10 MG PO TABS: 20.0000 mg | ORAL_TABLET | Freq: Every day | ORAL | Status: DC

## 2017-03-03 MED ORDER — PRIMIDONE 50 MG PO TABS
50.0000 mg | ORAL_TABLET | Freq: Every day | ORAL | Status: DC
  Administered 2017-03-03 – 2017-03-05 (×3): 50 mg via ORAL
  Filled 2017-03-03 (×3): qty 1

## 2017-03-03 MED ORDER — LISINOPRIL 10 MG PO TABS: 10.0000 mg | ORAL_TABLET | Freq: Every day | ORAL | Status: DC

## 2017-03-03 MED ORDER — OXYCODONE HCL 5 MG PO TABS
5.0000 mg | ORAL_TABLET | Freq: Four times a day (QID) | ORAL | Status: DC | PRN
  Administered 2017-03-04 – 2017-03-05 (×3): 5 mg via ORAL
  Filled 2017-03-03 (×4): qty 1

## 2017-03-03 NOTE — ED Triage Notes (Signed)
PT c/o cellulitis to left lower leg unrelieved by antibiotics prescribed by PCP this past week. PT denies any fevers.

## 2017-03-03 NOTE — H&P (Signed)
History and Physical  OSHUA [name] BJY:[id number] DOB: 1951/04/30 DOA: 03/03/2017  Referring physician: Dr Thurnell Garbe, ED physician PCP: Redmond School, MD  Outpatient Specialists:   Caralyn Guile Manson [facility]sey)  Vertell [name] (Neurosurgery)  Patient Coming From: Home  Chief Complaint: Left leg redness  HPI: Robert [name] is a 66 y.o. male with a history of type 2 diabetes on insulin, GERD, coronary artery disease is treated by MI and ischemic cardiomyopathy with EF of 50-55% on echocardiogram 01/2015, COPD, hypertension. Patient presents to the emergency department with redness to left lower extremity. The patient was treated by his PCP with a 10 day course of doxycycline, which the patient has finished, with minimal improvement to his left lower extremity. Patient also developed 2 blisters on his left lower extremity, approximately 3-4 cm in diameter. These have burst and leaking clear fluid. No palliating or provoking factors. Patient denies fevers, chills, nausea, vomiting, chest pain, abdominal pain. He did have a history of similar infections in the same place the beginning of the year, which was treated with Keflex with good results.  Emergency Department Course: White count and lactic acid normal. Patient given vancomycin in the emergency department.  Review of Systems:   Pt denies any fevers, chills, nausea, vomiting, diarrhea, constipation, abdominal pain, shortness of breath, dyspnea on exertion, orthopnea, cough, wheezing, palpitations, headache, vision changes, lightheadedness, dizziness, melena, rectal bleeding.  Review of systems are otherwise negative  Past Medical History:  Diagnosis Date  . Anxiety   . COPD (chronic obstructive pulmonary disease) ([place])   . Coronary artery disease   . Full dentures   . GERD (gastroesophageal reflux disease)    Rolaids as needed  . High cholesterol   . History of MI (myocardial infarction)   . Hypertension    med. dosage increased 04/2016; has been on BP  med. x 10 yrs.  . Incarcerated umbilical hernia 04/6577  . Inguinal hernia 05/2016   bilateral   . Insulin dependent diabetes mellitus ([place])   . Ischemic cardiomyopathy    a. 03/2015 EF 40-45% by LV gram.  . Rheumatoid arthritis(714.0)    Past Surgical History:  Procedure Laterality Date  . CARDIAC CATHETERIZATION N/A 02/01/2015   Procedure: Left Heart Cath and Coronary Angiography;  Surgeon: Belva Crome, MD;  Location: [place] [facility];  Service: Cardiovascular;  Laterality: N/A;  . CARDIAC CATHETERIZATION N/A 03/22/2015   Procedure: Left Heart Cath and Coronary Angiography;  Surgeon: Leonie Man, MD;  Location: [place] [facility];  Service: Cardiovascular;  Laterality: N/A;  . CARDIAC CATHETERIZATION  09/05/2002; 03/09/2003; 04/10/2005;06/02/2005; 05/09/2006; 02/09/2007; 03/01/2007  . CORONARY ANGIOPLASTY  11/14/2012; 02/01/2015  . CORONARY ANGIOPLASTY WITH STENT PLACEMENT  05/16/2012   "1; makes total ~ 4"  . INSERTION OF MESH Bilateral 05/24/2016   Procedure: INSERTION OF MESH;  Surgeon: Coralie Keens, MD;  Location: [place];  Service: General;  Laterality: Bilateral;  . LAPAROSCOPIC CHOLECYSTECTOMY    . LAPAROSCOPIC INGUINAL HERNIA WITH UMBILICAL HERNIA Bilateral 05/24/2016   Procedure: BILATERAL LAPAROSCOPIC INGUINAL HERNIA REPAIR WITH MESH AND UMBILICAL HERNIA REPAIR WITH MESH;  Surgeon: Coralie Keens, MD;  Location: [place];  Service: General;  Laterality: Bilateral;  . LEFT HEART CATHETERIZATION WITH CORONARY ANGIOGRAM N/A 12/22/2011   Procedure: LEFT HEART CATHETERIZATION WITH CORONARY ANGIOGRAM;  Surgeon: Troy Sine, MD;  Location: [facility] CATH LAB;  Service: Cardiovascular;  Laterality: N/A;  . LEFT HEART CATHETERIZATION WITH CORONARY ANGIOGRAM Bilateral 05/16/2012   Procedure: LEFT HEART CATHETERIZATION  WITH CORONARY ANGIOGRAM;  Surgeon: Lorretta Harp, MD;  Location: [facility] LAB;  Service: Cardiovascular;  Laterality: Bilateral;  . LEFT HEART  CATHETERIZATION WITH CORONARY ANGIOGRAM N/A 11/14/2012   Procedure: LEFT HEART CATHETERIZATION WITH CORONARY ANGIOGRAM;  Surgeon: Troy Sine, MD;  Location: [facility] CATH LAB;  Service: Cardiovascular;  Laterality: N/A;  . LEFT HEART CATHETERIZATION WITH CORONARY ANGIOGRAM N/A 09/01/2013   Procedure: LEFT HEART CATHETERIZATION WITH CORONARY ANGIOGRAM;  Surgeon: Leonie Man, MD;  Location: [facility] CATH LAB;  Service: Cardiovascular;  Laterality: N/A;  . [place]; 1973; 1985   x 3  . NM MYOCAR PERF WALL MOTION  08/30/2009   No significant ischemia  . OPEN REDUCTION INTERNAL FIXATION (ORIF) DISTAL RADIAL FRACTURE Right 12/10/2016   Procedure: OPEN REDUCTION INTERNAL FIXATION (ORIF) DISTAL RADIAL FRACTURE;  Surgeon: Iran Planas, MD;  Location: [place];  Service: Orthopedics;  Laterality: Right;  . PERCUTANEOUS CORONARY INTERVENTION-BALLOON ONLY  12/22/2011   Procedure: PERCUTANEOUS CORONARY INTERVENTION-BALLOON ONLY;  Surgeon: Troy Sine, MD;  Location: [facility] CATH LAB;  Service: Cardiovascular;;  . PERCUTANEOUS CORONARY INTERVENTION-BALLOON ONLY  11/14/2012   Procedure: PERCUTANEOUS CORONARY INTERVENTION-BALLOON ONLY;  Surgeon: Troy Sine, MD;  Location: [facility] CATH LAB;  Service: Cardiovascular;;  . PERCUTANEOUS CORONARY STENT INTERVENTION (PCI-S)  05/16/2012   Procedure: PERCUTANEOUS CORONARY STENT INTERVENTION (PCI-S);  Surgeon: Lorretta Harp, MD;  Location: [facility] CATH LAB;  Service: Cardiovascular;;  . PERCUTANEOUS CORONARY STENT INTERVENTION (PCI-S)  09/01/2013   Procedure: PERCUTANEOUS CORONARY STENT INTERVENTION (PCI-S);  Surgeon: Leonie Man, MD;  Location: [facility] CATH LAB;  Service: Cardiovascular;;   Social History:  reports that he has been smoking.  He has been smoking about 0.50 packs per day. He has never used smokeless tobacco. He reports that he does not drink alcohol or use drugs. Patient lives at [place]  . Ivp Dye [Iodinated Diagnostic Agents]  Itching and Rash  . Plavix [Clopidogrel Bisulfate] Rash    Family History  Problem Relation Age of Onset  . Colon cancer Father   . Hypertension Mother   . Heart disease Mother   . Cancer Mother       Prior to Admission medications   Medication Sig Start Date End Date Taking? Authorizing Provider  acetaminophen (TYLENOL) 650 MG CR tablet Take 650 mg by mouth every 8 (eight) hours as needed for pain.   Yes [provider]  AMBULATORY NON FORMULARY MEDICATION Take 600 mg by mouth daily at 12 noon. Medication Name: Dalcetrapib vs placebo Patient taking differently: Take 600 mg by mouth every evening. Medication Name: Dalcetrapib vs placebo: Cholesterol 12/15/16  Yes Jettie Booze, MD  aspirin EC 81 MG EC tablet Take 1 tablet (81 mg total) by mouth daily. 11/15/12  Yes Brett Canales, PA-C  atorvastatin (LIPITOR) 80 MG tablet Take 1 tablet (80 mg total) by mouth daily at 6 PM. 05/08/16  Yes Croitoru, Mihai, MD  fish oil-omega-3 fatty acids 1000 MG capsule Take 1 g by mouth daily.    Yes [provider]  folic acid (FOLVITE) 1 MG tablet Take 1 mg by mouth daily.    Yes [provider]  glipiZIDE (GLUCOTROL) 10 MG tablet Take 10 mg by mouth 2 (two) times daily. 02/12/17  Yes [provider]  Insulin Glargine (LANTUS SOLOSTAR) 100 UNIT/ML Solostar Pen Inject 80 Units into the skin daily at 10 pm.   Yes [provider]  isosorbide mononitrate (IMDUR)  120 MG 24 hr tablet Take 120 mg by mouth daily.   Yes [provider]  magnesium oxide (MAG-OX) 400 MG tablet Take 400 mg by mouth 2 (two) times daily.   Yes [provider]  metFORMIN (GLUCOPHAGE) 1000 MG tablet Take 1 tablet (1,000 mg total) by mouth 2 (two) times daily with a meal. 03/23/15  Yes Bhagat, Bhavinkumar, PA  ALPRAZolam (XANAX) 0.5 MG tablet Take 0.5 mg by mouth 3 (three) times daily as needed for anxiety.     [provider]  doxycycline (VIBRA-TABS) 100 MG tablet  Take 100 mg by mouth 2 (two) times daily. 10 day course starting on 02/14/2017 COMPLETED 02/14/17   [provider]  furosemide (LASIX) 20 MG tablet  02/01/17   [provider]  metoprolol tartrate (LOPRESSOR) 25 MG tablet TAKE 1 TABLET TWICE DAILY 02/20/17   Croitoru, Mihai, MD  nitroGLYCERIN (NITROSTAT) 0.4 MG SL tablet Place 1 tablet (0.4 mg total) under the tongue every 5 (five) minutes x 3 doses as needed for chest pain. 04/24/16   Croitoru, Mihai, MD  oxyCODONE (OXY IR/ROXICODONE) 5 MG immediate release tablet Take 1 tablet (5 mg total) by mouth every 6 (six) hours as needed for moderate pain. 12/13/16   Focht, Fraser Din, PA  primidone (MYSOLINE) 50 MG tablet Take 50 mg by mouth daily.    [provider]  quinapril (ACCUPRIL) 10 MG tablet Take 10 mg by mouth at bedtime. IN CONJUNCTION WITH ONE-HALF (20 MG) OF A 40 MG TABLET TO EQUAL A TOTAL OF 30 MILLIGRAMS    [provider]  quinapril (ACCUPRIL) 40 MG tablet Take 20 mg by mouth at bedtime. IN CONJUNCTION WITH ONE 10 MG TABLET TO EQUAL A TOTAL OF 30 MILLIGRAMS    [provider]  Tocilizumab 162 MG/0.9ML SOSY Inject 162 mg into the skin once a week. ACTEMRA    [provider]    Physical Exam: BP (!) 164/76 (BP Location: Right Arm)   Pulse 86   Temp 98.5 F (36.9 C) (Oral)   Resp 18   Ht 6' (1.829 m)   Wt 115.7 kg (255 lb)   SpO2 93%   BMI 34.58 kg/m   General: Middle-aged Caucasian male who appears in fairly good health. Awake and alert and oriented x3. No acute cardiopulmonary distress.  HEENT: Normocephalic atraumatic.  Right and left ears normal in appearance.  Pupils equal, round, reactive to light. Extraocular muscles are intact. Sclerae anicteric and noninjected.  Moist mucosal membranes. No mucosal lesions.  Neck: Neck supple without lymphadenopathy. No carotid bruits. No masses palpated.  Cardiovascular: Regular rate with normal S1-S2 sounds. No murmurs, rubs, gallops auscultated.  No JVD.  Respiratory: Good respiratory effort with no wheezes, rales, rhonchi. Lungs clear to auscultation bilaterally.  No accessory muscle use. Abdomen: Soft, nontender, nondistended. Active bowel sounds. No masses or hepatosplenomegaly  Skin: There is erythema to the anterior and medial aspects of the left lower leg. There is minimal edema. There are 2 areas measuring approximately 5 cm in diameter with the listers have ruptured, one on the anterior aspect and the other on the medial aspect of his left lower leg. No other rashes, lesions, or ulcerations.  Dry, warm to touch. 2+ dorsalis pedis and radial pulses. Musculoskeletal: No calf or leg pain. All major joints not erythematous nontender.  No upper or lower joint deformation.  Good ROM.  No contractures  Psychiatric: Intact judgment and insight. Pleasant and cooperative. Neurologic: No focal neurological  deficits. Strength is 5/5 and symmetric in upper and lower extremities.  Cranial nerves II through XII are grossly intact.           Labs on Admission: I have personally reviewed following labs and imaging studies  CBC:  Recent Labs Lab 03/03/17 1633  WBC 6.7  NEUTROABS 4.4  HGB 17.0  HCT 49.3  MCV 96.7  PLT 99*   Basic Metabolic Panel:  Recent Labs Lab 03/03/17 1633  NA 137  K 3.9  CL 103  CO2 26  GLUCOSE 225*  BUN 15  CREATININE 0.88  CALCIUM 9.0   GFR: Estimated Creatinine Clearance: 112.8 mL/min (by C-G formula based on SCr of 0.88 mg/dL). Liver Function Tests: No results for input(s): AST, ALT, ALKPHOS, BILITOT, PROT, ALBUMIN in the last 168 hours. No results for input(s): LIPASE, AMYLASE in the last 168 hours. No results for input(s): AMMONIA in the last 168 hours. Coagulation Profile: No results for input(s): INR, PROTIME in the last 168 hours. Cardiac Enzymes: No results for input(s): CKTOTAL, CKMB, CKMBINDEX, TROPONINI in the last 168 hours. BNP (last 3 results) No results for input(s): PROBNP in the  last 8760 hours. HbA1C: No results for input(s): HGBA1C in the last 72 hours. CBG: No results for input(s): GLUCAP in the last 168 hours. Lipid Profile: No results for input(s): CHOL, HDL, LDLCALC, TRIG, CHOLHDL, LDLDIRECT in the last 72 hours. Thyroid Function Tests: No results for input(s): TSH, T4TOTAL, FREET4, T3FREE, THYROIDAB in the last 72 hours. Anemia Panel: No results for input(s): VITAMINB12, FOLATE, FERRITIN, TIBC, IRON, RETICCTPCT in the last 72 hours. Urine analysis:    Component Value Date/Time   COLORURINE YELLOW 12/22/2011 2130   APPEARANCEUR CLEAR 12/22/2011 2130   LABSPEC 1.038 (H) 12/22/2011 2130   PHURINE 5.5 12/22/2011 2130   GLUCOSEU >1000 (A) 12/22/2011 2130   HGBUR NEGATIVE 12/22/2011 2130   BILIRUBINUR NEGATIVE 12/22/2011 2130   KETONESUR 15 (A) 12/22/2011 2130   PROTEINUR NEGATIVE 12/22/2011 2130   UROBILINOGEN 0.2 12/22/2011 2130   NITRITE NEGATIVE 12/22/2011 2130   LEUKOCYTESUR NEGATIVE 12/22/2011 2130   Sepsis Labs: @LABRCNTIP (procalcitonin:4,lacticidven:4) )No results found for this or any previous visit (from the past 240 hour(s)).   Radiological Exams on Admission: No results found.  Assessment/Plan: Principal Problem:   Cellulitis Active Problems:   Type II IDDM   HTN (hypertension)   PVD, chronic LLE   Coronary artery disease    This patient was discussed with the ED physician, including pertinent vitals, physical exam findings, labs, and imaging.  We also discussed care given by the ED provider.  #1 cellulitis  Admit  Patient had a negative MRSA screen to half months ago. Patient has no risk factors for obtaining MRSA, therefore will change the patient to Ancef 2 g IV every 8 hours  No risk factors for DVT  Questionable component of chronic venous stasis - will give extra dose of Lasix 20 mg tonight #2 insulin-dependent diabetes  Continue home medications  CBGs before meals and daily at bedtime  Sliding scale insulin #3  hypertension  We'll give patient home medication #4 peripheral vascular disease   #5 coronary artery disease   Continue home medication   DVT prophylaxis: Lovenox Consultants: None Code Status:  full code Family Communication:  wife present for entire conversation  Disposition Plan:  admission   Anterio Scheel [place] [facility] Pager [phone]  If 7PM-7AM, please contact night-coverage [email or web address] Password [email or web address]

## 2017-03-03 NOTE — ED Provider Notes (Signed)
[facility] DEPT Provider Note   CSN: [id number] Arrival date & time: 03/03/17  1543     History   Chief Complaint Chief Complaint  Patient presents with  . Cellulitis    HPI Robert [name] is a 66 y.o. male.     Pt was seen at 1610. Per pt and his family, c/o gradual onset and worsening of persistent left leg "blister" and "rash" that began 2 weeks ago. Pt states he was evaluated by his PMD for same, dx cellulitis, and rx doxycycline. Pt states he completed the course of doxycycline without improvement in rash. Pt states the "blister popped" a few days ago. Denies fevers, no other areas of rash, no purulent drainage, no focal motor weakness, no tingling/numbness in extremities, no injury.    Past Medical History:  Diagnosis Date  . Anxiety   . COPD (chronic obstructive pulmonary disease) ([place])   . Coronary artery disease   . Full dentures   . GERD (gastroesophageal reflux disease)    Rolaids as needed  . High cholesterol   . History of MI (myocardial infarction)   . Hypertension    med. dosage increased 04/2016; has been on BP med. x 10 yrs.  . Incarcerated umbilical hernia 36/6440  . Inguinal hernia 05/2016   bilateral   . Insulin dependent diabetes mellitus ([place])   . Ischemic cardiomyopathy    a. 03/2015 EF 40-45% by LV gram.  . Rheumatoid arthritis(714.0)     Patient Active Problem List   Diagnosis Date Noted  . Right radial fracture 12/11/2016  . T3 vertebral fracture ([place]) 12/11/2016  . Fall 12/09/2016  . Thrombocytopenia ([place]) 10/09/2016  . Cellulitis 10/08/2016  . Diabetes ([place]) 10/08/2016  . Chronic combined systolic and diastolic CHF (congestive heart failure) ([place]) 04/25/2016  . Coronary artery disease   . High cholesterol   . Type II diabetes mellitus ([place])   . Essential hypertension   . Non-STEMI (non-ST elevated myocardial infarction) ([place])   . CAD S/P percutaneous coronary angioplasty   . Unstable angina ([place]) 03/20/2015  . ICM-EF 35% at cath  02/01/15 02/02/2015  . CAD (coronary artery disease) 01/09/2015  . DM type 2 causing vascular disease, not at goal [facility]) 05/29/2014  . Dyspnea 10/20/2013  . COPD (chronic obstructive pulmonary disease) ([place]) 09/03/2013  . Hyperlipidemia   . Old myocardial infarction 09/01/2013  . Abnormal nuclear stress test 05/17/2012  . S/P CFX PTCAI for ISR 02/01/15 05/17/2012  . PVD, chronic LLE 12/22/2011  . Rheumatoid arthritis ([place]) 12/22/2011  . Contrast media allergy 12/22/2011  . Type II IDDM 12/21/2011  . HTN (hypertension) 12/21/2011  . Tobacco abuse 12/21/2011    Past Surgical History:  Procedure Laterality Date  . CARDIAC CATHETERIZATION N/A 02/01/2015   Procedure: Left Heart Cath and Coronary Angiography;  Surgeon: Belva Crome, MD;  Location: [place] CV LAB;  Service: Cardiovascular;  Laterality: N/A;  . CARDIAC CATHETERIZATION N/A 03/22/2015   Procedure: Left Heart Cath and Coronary Angiography;  Surgeon: Leonie Man, MD;  Location: [place] CV LAB;  Service: Cardiovascular;  Laterality: N/A;  . CARDIAC CATHETERIZATION  09/05/2002; 03/09/2003; 04/10/2005;06/02/2005; 05/09/2006; 02/09/2007; 03/01/2007  . CORONARY ANGIOPLASTY  11/14/2012; 02/01/2015  . CORONARY ANGIOPLASTY WITH STENT PLACEMENT  05/16/2012   "1; makes total ~ 4"  . INSERTION OF MESH Bilateral 05/24/2016   Procedure: INSERTION OF MESH;  Surgeon: Coralie Keens, MD;  Location: [place];  Service: General;  Laterality: Bilateral;  . LAPAROSCOPIC CHOLECYSTECTOMY    .  LAPAROSCOPIC INGUINAL HERNIA WITH UMBILICAL HERNIA Bilateral 05/24/2016   Procedure: BILATERAL LAPAROSCOPIC INGUINAL HERNIA REPAIR WITH MESH AND UMBILICAL HERNIA REPAIR WITH MESH;  Surgeon: Coralie Keens, MD;  Location: [place];  Service: General;  Laterality: Bilateral;  . LEFT HEART CATHETERIZATION WITH CORONARY ANGIOGRAM N/A 12/22/2011   Procedure: LEFT HEART CATHETERIZATION WITH CORONARY ANGIOGRAM;  Surgeon: Troy Sine, MD;  Location: [facility] CATH LAB;  Service: Cardiovascular;  Laterality: N/A;  . LEFT HEART CATHETERIZATION WITH CORONARY ANGIOGRAM Bilateral 05/16/2012   Procedure: LEFT HEART CATHETERIZATION WITH CORONARY ANGIOGRAM;  Surgeon: Lorretta Harp, MD;  Location: [facility] CATH LAB;  Service: Cardiovascular;  Laterality: Bilateral;  . LEFT HEART CATHETERIZATION WITH CORONARY ANGIOGRAM N/A 11/14/2012   Procedure: LEFT HEART CATHETERIZATION WITH CORONARY ANGIOGRAM;  Surgeon: Troy Sine, MD;  Location: [facility] CATH LAB;  Service: Cardiovascular;  Laterality: N/A;  . LEFT HEART CATHETERIZATION WITH CORONARY ANGIOGRAM N/A 09/01/2013   Procedure: LEFT HEART CATHETERIZATION WITH CORONARY ANGIOGRAM;  Surgeon: Leonie Man, MD;  Location: [facility] CATH LAB;  Service: Cardiovascular;  Laterality: N/A;  . [place]; 1973; 1985   x 3  . NM MYOCAR PERF WALL MOTION  08/30/2009   No significant ischemia  . OPEN REDUCTION INTERNAL FIXATION (ORIF) DISTAL RADIAL FRACTURE Right 12/10/2016   Procedure: OPEN REDUCTION INTERNAL FIXATION (ORIF) DISTAL RADIAL FRACTURE;  Surgeon: Iran Planas, MD;  Location: [place];  Service: Orthopedics;  Laterality: Right;  . PERCUTANEOUS CORONARY INTERVENTION-BALLOON ONLY  12/22/2011   Procedure: PERCUTANEOUS CORONARY INTERVENTION-BALLOON ONLY;  Surgeon: Troy Sine, MD;  Location: [facility] CATH LAB;  Service: Cardiovascular;;  . PERCUTANEOUS CORONARY INTERVENTION-BALLOON ONLY  11/14/2012   Procedure: PERCUTANEOUS CORONARY INTERVENTION-BALLOON ONLY;  Surgeon: Troy Sine, MD;  Location: [facility] CATH LAB;  Service: Cardiovascular;;  . PERCUTANEOUS CORONARY STENT INTERVENTION (PCI-S)  05/16/2012   Procedure: PERCUTANEOUS CORONARY STENT INTERVENTION (PCI-S);  Surgeon: Lorretta Harp, MD;  Location: [facility] CATH LAB;  Service: Cardiovascular;;  . PERCUTANEOUS CORONARY STENT INTERVENTION (PCI-S)  09/01/2013   Procedure: PERCUTANEOUS CORONARY STENT INTERVENTION (PCI-S);  Surgeon: Leonie Man, MD;   Location: [facility] CATH LAB;  Service: Cardiovascular;;       Home Medications    Prior to Admission medications   Medication Sig Start Date End Date Taking? Authorizing Provider  acetaminophen (TYLENOL) 650 MG CR tablet Take 650 mg by mouth every 8 (eight) hours as needed for pain.    [provider]  ALPRAZolam Duanne Moron) 0.5 MG tablet Take 0.5 mg by mouth 3 (three) times daily as needed for anxiety.     [provider]  AMBULATORY NON FORMULARY MEDICATION Take 600 mg by mouth daily at 12 noon. Medication Name: Dalcetrapib vs placebo 12/15/16   Jettie Booze, MD  aspirin EC 81 MG EC tablet Take 1 tablet (81 mg total) by mouth daily. 11/15/12   Brett Canales, PA-C  atorvastatin (LIPITOR) 80 MG tablet Take 1 tablet (80 mg total) by mouth daily at 6 PM. 05/08/16   Croitoru, Mihai, MD  fish oil-omega-3 fatty acids 1000 MG capsule Take 1 g by mouth daily.     [provider]  folic acid (FOLVITE) 1 MG tablet Take 1 mg by mouth daily.     [provider]  glipiZIDE (GLUCOTROL XL) 10 MG 24 hr tablet Take 10 mg by mouth 2 (two) times daily.    [provider]  Insulin Glargine (LANTUS SOLOSTAR) 100 UNIT/ML Solostar Pen Inject 80 Units into  the skin daily at 10 pm.    [provider]  isosorbide mononitrate (IMDUR) 120 MG 24 hr tablet Take 120 mg by mouth daily.    [provider]  magnesium oxide (MAG-OX) 400 MG tablet Take 400 mg by mouth 2 (two) times daily.    [provider]  metFORMIN (GLUCOPHAGE) 1000 MG tablet Take 1 tablet (1,000 mg total) by mouth 2 (two) times daily with a meal. 03/23/15   Bhagat, Bhavinkumar, PA  Methotrexate 2.5 MG/ML SOLN Take 1 mL by mouth once a week. [place]    [provider]  metoprolol tartrate (LOPRESSOR) 25 MG tablet TAKE 1 TABLET TWICE DAILY 02/20/17   Croitoru, Dani Gobble, MD  nitroGLYCERIN (NITROSTAT) 0.4 MG SL tablet Place 1 tablet (0.4 mg total) under the tongue every 5 (five) minutes x  3 doses as needed for chest pain. 04/24/16   Croitoru, Mihai, MD  oxyCODONE (OXY IR/ROXICODONE) 5 MG immediate release tablet Take 1 tablet (5 mg total) by mouth every 6 (six) hours as needed for moderate pain. 12/13/16   Focht, Fraser Din, PA  primidone (MYSOLINE) 50 MG tablet Take 50 mg by mouth daily.    [provider]  quinapril (ACCUPRIL) 10 MG tablet Take 10 mg by mouth at bedtime. IN CONJUNCTION WITH ONE-HALF (20 MG) OF A 40 MG TABLET TO EQUAL A TOTAL OF 30 MILLIGRAMS    [provider]  quinapril (ACCUPRIL) 40 MG tablet Take 20 mg by mouth at bedtime. IN CONJUNCTION WITH ONE 10 MG TABLET TO EQUAL A TOTAL OF 30 MILLIGRAMS    [provider]  Tocilizumab 162 MG/0.9ML SOSY Inject 162 mg into the skin once a week. ACTEMRA    [provider]    Family History Family History  Problem Relation Age of Onset  . Colon cancer Father   . Hypertension Mother   . Heart disease Mother   . Cancer Mother     Social History Social History  Substance Use Topics  . Smoking status: Current Every Day Smoker    Packs/day: 0.50    Last attempt to quit: 06/11/2015  . Smokeless tobacco: Never Used  . Alcohol use No     Allergies   Ivp dye [iodinated diagnostic agents] and Plavix [clopidogrel bisulfate]   Review of Systems Review of Systems ROS: Statement: All systems negative except as marked or noted in the HPI; Constitutional: Negative for fever and chills. ; ; Eyes: Negative for eye pain, redness and discharge. ; ; ENMT: Negative for ear pain, hoarseness, nasal congestion, sinus pressure and sore throat. ; ; Cardiovascular: Negative for chest pain, palpitations, diaphoresis, dyspnea and peripheral edema. ; ; Respiratory: Negative for cough, wheezing and stridor. ; ; Gastrointestinal: Negative for nausea, vomiting, diarrhea, abdominal pain, blood in stool, hematemesis, jaundice and rectal bleeding. . ; ; Genitourinary: Negative for dysuria, flank pain and hematuria.  ; ; Musculoskeletal: Negative for back pain and neck pain. Negative for swelling and trauma.; ; Skin: +rash, blisters. Negative for pruritus, abrasions, bruising and skin lesion.; ; Neuro: Negative for headache, lightheadedness and neck stiffness. Negative for weakness, altered level of consciousness, altered mental status, extremity weakness, paresthesias, involuntary movement, seizure and syncope.       Physical Exam Updated Vital Signs BP (!) 164/76 (BP Location: Right Arm)   Pulse 86   Temp 98.5 F (36.9 C) (Oral)   Resp 18   Ht 6' (1.829 m)   Wt 115.7 kg (255 lb)   SpO2 93%  BMI 34.58 kg/m   Physical Exam 1615: Physical examination:  Nursing notes reviewed; Vital signs and O2 SAT reviewed;  Constitutional: Well developed, Well nourished, Well hydrated, In no acute distress; Head:  Normocephalic, atraumatic; Eyes: EOMI, PERRL, No scleral icterus; ENMT: Mouth and pharynx normal, Mucous membranes moist; Neck: Supple, Full range of motion, No lymphadenopathy; Cardiovascular: Regular rate and rhythm, No gallop; Respiratory: Breath sounds clear & equal bilaterally, No wheezes.  Speaking full sentences with ease, Normal respiratory effort/excursion; Chest: Nontender, Movement normal; Abdomen: Soft, Nontender, Nondistended, Normal bowel sounds; Genitourinary: No CVA tenderness; Extremities: Pedal pulses palpable. +erythema to left anterior and medial lower leg with 2 large shallow open ulcers, no purulent drainage, no bleeding. No calf tenderness, edema or asymmetry.; Neuro: AA&Ox3, Major CN grossly intact.  Speech clear. No gross focal motor or sensory deficits in extremities.; Skin: Color normal, Warm, Dry.   ED Treatments / Results  Labs (all labs ordered are listed, but only abnormal results are displayed)   EKG  EKG Interpretation None       Radiology   Procedures Procedures (including critical care time)  Medications Ordered in ED Medications - No data to  display   Initial Impression / Assessment and Plan / ED Course  I have reviewed the triage vital signs and the nursing notes.  Pertinent labs & imaging results that were available during my care of the patient were reviewed by me and considered in my medical decision making (see chart for details).  MDM Reviewed: previous chart, nursing note and vitals Reviewed previous: labs Interpretation: labs   Results for orders placed or performed during the hospital encounter of 40/98/11  Basic metabolic panel  Result Value Ref Range   Sodium 137 135 - 145 mmol/L   Potassium 3.9 3.5 - 5.1 mmol/L   Chloride 103 101 - 111 mmol/L   CO2 26 22 - 32 mmol/L   Glucose, Bld 225 (H) 65 - 99 mg/dL   BUN 15 6 - 20 mg/dL   Creatinine, Ser 0.88 0.61 - 1.24 mg/dL   Calcium 9.0 8.9 - 10.3 mg/dL   GFR calc non Af Amer >60 >60 mL/min   GFR calc Af Amer >60 >60 mL/min   Anion gap 8 5 - 15  Lactic acid, plasma  Result Value Ref Range   Lactic Acid, Venous 1.2 0.5 - 1.9 mmol/L  CBC with Differential  Result Value Ref Range   WBC 6.7 4.0 - 10.5 K/uL   RBC 5.10 4.22 - 5.81 MIL/uL   Hemoglobin 17.0 13.0 - 17.0 g/dL   HCT 49.3 39.0 - 52.0 %   MCV 96.7 78.0 - 100.0 fL   MCH 33.3 26.0 - 34.0 pg   MCHC 34.5 30.0 - 36.0 g/dL   RDW 15.3 11.5 - 15.5 %   Platelets 99 (L) 150 - 400 K/uL   Neutrophils Relative % 66 %   Neutro Abs 4.4 1.7 - 7.7 K/uL   Lymphocytes Relative 23 %   Lymphs Abs 1.5 0.7 - 4.0 K/uL   Monocytes Relative 8 %   Monocytes Absolute 0.5 0.1 - 1.0 K/uL   Eosinophils Relative 3 %   Eosinophils Absolute 0.2 0.0 - 0.7 K/uL   Basophils Relative 1 %   Basophils Absolute 0.0 0.0 - 0.1 K/uL     1745:  Failure of outpatient therapy; will dose IV vancomycin. Dx and testing d/w pt and family.  Questions answered.  Verb understanding, agreeable to admit.  T/C to [facility] Dr. Nehemiah Settle,  case discussed, including:  HPI, pertinent PM/SHx, VS/PE, dx testing, ED course and treatment:  Agreeable to admit.     Final Clinical Impressions(s) / ED Diagnoses   Final diagnoses:  None    New Prescriptions New Prescriptions   No medications on file      Francine Graven, DO 03/06/17 1638

## 2017-03-04 ENCOUNTER — Encounter (HOSPITAL_COMMUNITY): Payer: Self-pay | Admitting: Internal Medicine

## 2017-03-04 LAB — BASIC METABOLIC PANEL
Anion gap: 5 (ref 5–15)
BUN: 14 mg/dL (ref 6–20)
CHLORIDE: 104 mmol/L (ref 101–111)
CO2: 28 mmol/L (ref 22–32)
Calcium: 8.8 mg/dL — ABNORMAL LOW (ref 8.9–10.3)
Creatinine, Ser: 0.87 mg/dL (ref 0.61–1.24)
GFR calc Af Amer: >60 mL/min (ref >60)
GFR calc non Af Amer: >60 mL/min (ref >60)
GLUCOSE: 69 mg/dL (ref 65–99)
Potassium: 3.8 mmol/L (ref 3.5–5.1)
SODIUM: 137 mmol/L (ref 135–145)

## 2017-03-04 LAB — CBC
HEMATOCRIT: 49.4 % (ref 39.0–52.0)
Hemoglobin: 16.8 g/dL (ref 13.0–17.0)
MCH: 33.1 pg (ref 26.0–34.0)
MCHC: 34 g/dL (ref 30.0–36.0)
MCV: 97.4 fL (ref 78.0–100.0)
Platelets: 106 10*3/uL — ABNORMAL LOW (ref 150–400)
RBC: 5.07 MIL/uL (ref 4.22–5.81)
RDW: 15.3 % (ref 11.5–15.5)
WBC: 7.5 10*3/uL (ref 4.0–10.5)

## 2017-03-04 LAB — GLUCOSE, CAPILLARY
GLUCOSE-CAPILLARY: 206 mg/dL — AB (ref 65–99)
GLUCOSE-CAPILLARY: 239 mg/dL — AB (ref 65–99)
GLUCOSE-CAPILLARY: 352 mg/dL — AB (ref 65–99)
Glucose-Capillary: 81 mg/dL (ref 65–99)

## 2017-03-04 MED ORDER — IPRATROPIUM-ALBUTEROL 0.5-2.5 (3) MG/3ML IN SOLN: 3.0000 mL | Freq: Four times a day (QID) | RESPIRATORY_TRACT | Status: DC | PRN

## 2017-03-04 NOTE — Progress Notes (Signed)
PROGRESS NOTE    Robert [name]  OFB:[id number] DOB: 1951/01/02 DOA: 03/03/2017 PCP: Redmond School, MD    Brief Narrative:  Patient is a 66 y.o. male with a history of type 2 diabetes on insulin, GERD, coronary artery disease, ischemic cardiomyopathy with EF of 40-45% per cath on 7//2016, COPD, HTN, and RA on methotrexate and tocilizumab.[name] He presented to the ED with redness to left lower extremity. He was treated by his PCP with a 10 day course of doxycycline, which he finished, with minimal improvement. Patient also developed 2 blisters on his left lower extremity, approximately 3-4 cm in diameter. They have burst and started leaking clear fluid. He denied palliating or provoking factors. He denies fever, chills, nausea, vomiting, chest pain, or abdominal pain. He has a history of similar infection on the same leg earlier this year which was treated with Keflex with good results. In the ED, he was afebrile and hemodynamically stable. His white blood cell count was within normal limits. Other lab data were significant for a platelet count of 99 and glucose of 225.   Assessment & Plan:   Principal Problem:   Left leg cellulitis Active Problems:   PVD, chronic LLE   Rheumatoid arthritis (HCC)   COPD (chronic obstructive pulmonary disease) (HCC)   DM type 2 causing vascular disease, not at goal [facility])   CAD S/P percutaneous coronary angioplasty   Essential hypertension   Thrombocytopenia ([place])   1. Left lower extremity cellulitis, query recurrent. The patient was given vancomycin in the ED. He has a history of a negative MRSA screen. -Ancef was started for treatment. -Dressing changes were ordered daily. Will ask for wound care consult due to the peeling skin.  Probable chronic LLE venous stasis changes. -Patient was given 1 dose of Lasix. -Continue dressing and supportive treatment.  CAD, status post angioplasty/ischemic cardiomyopathy; essential hypertension. Patient has a  history of multivessel CAD, with a history of angioplasty, bare-metal stent and drug-eluting stent in the Ost Cx to Mid Cx lesion in 2016. His EF was 40-45%. - His chronic medications including isosorbide mononitrate, lisinopril, metoprolol, Lasix, Lipitor, and aspirin were continued. -Monitor his blood pressure closely to avoid hypotension.  Insulin-dependent type 2 diabetes with vascular disease. Patient is treated chronically with Lantus, metformin, and glipizide.  -Due to low-normal blood CBG this morning, will hold metformin and glipizide. -We will continue SSI. -We'll order hemoglobin A1c.  COPD. He has a few wheezes on exam, but no outright exacerbation. -We'll start when necessary DuoNeb.  Rheumatoid arthritis. The patient is treated with methotrexate and tocilizumab chronically. -Both were withheld on admission. We'll continue to monitor.  Thrombocytopenia. Patient has a history of thrombocytopenia. His place line platelet count per chart review ranges from 93-105. It was  99 on admission. -The etiology is likely from RA and/or RA medications. -We will assess TSH and vitamin B12 to rule out deficiencies.   DVT prophylaxis: SCDs Code Status: Full code Family Communication: Family not available Disposition Plan: Discharge to home when clinically appropriate, possibly in 2-3 days.   Consultants:   None  Procedures:   None  Antimicrobials:   Ancef 6/23>>   Subjective: Patient denies leg pain. He denies chest pain or shortness of breath. He denies having any side effects from low-normal blood sugar this morning.  Objective: Vitals:   03/03/17 1557 03/03/17 1558 03/03/17 2100 03/04/17 0640  BP: (!) 164/76  (!) 158/81 (!) 110/59  Pulse: 86  76 73  Resp: 18  18 18  Temp: 98.5 F (36.9 C)  97.8 F (36.6 C) 98.4 F (36.9 C)  TempSrc: Oral  Oral Oral  SpO2: 93%  95% 93%  Weight:  115.7 kg (255 lb)  110 kg (242 lb 9.6 oz)  Height:  6' (1.829 m)       Intake/Output Summary (Last 24 hours) at 03/04/17 1026 Last data filed at 03/04/17 7564  Gross per 24 hour  Intake              670 ml  Output              975 ml  Net             -305 ml   Filed Weights   03/03/17 1558 03/04/17 0640  Weight: 115.7 kg (255 lb) 110 kg (242 lb 9.6 oz)    Examination:  General exam: Appears calm and comfortable  Respiratory system: Occasional wheezes. Respiratory effort normal. Cardiovascular system: S1 & S2 with soft systolic murmur. Trace pedal edema left greater than right. Gastrointestinal system: Abdomen is nondistended, soft and nontender. No organomegaly or masses felt. Normal bowel sounds heard. Central nervous system: Alert and oriented. No focal neurological deficits. Extremities/musculoskeletal: No acute hot red joints. Skin: Left lower extremity: Erythema below the knee to the ankle; superficial ulceration on the pretibial area and posterior/medial calf following ruptured bulla; peeling skin; scant serous strain edge; mildly tender. Psychiatry: Judgement and insight appear normal. Mood & affect appropriate.     Data Reviewed: I have personally reviewed following labs and imaging studies  CBC:  Recent Labs Lab 03/03/17 1633 03/04/17 0448  WBC 6.7 7.5  NEUTROABS 4.4  --   HGB 17.0 16.8  HCT 49.3 49.4  MCV 96.7 97.4  PLT 99* 332*   Basic Metabolic Panel:  Recent Labs Lab 03/03/17 1633 03/04/17 0448  NA 137 137  K 3.9 3.8  CL 103 104  CO2 26 28  GLUCOSE 225* 69  BUN 15 14  CREATININE 0.88 0.87  CALCIUM 9.0 8.8*   GFR: Estimated Creatinine Clearance: 111.4 mL/min (by C-G formula based on SCr of 0.87 mg/dL). Liver Function Tests: No results for input(s): AST, ALT, ALKPHOS, BILITOT, PROT, ALBUMIN in the last 168 hours. No results for input(s): LIPASE, AMYLASE in the last 168 hours. No results for input(s): AMMONIA in the last 168 hours. Coagulation Profile: No results for input(s): INR, PROTIME in the last 168  hours. Cardiac Enzymes: No results for input(s): CKTOTAL, CKMB, CKMBINDEX, TROPONINI in the last 168 hours. BNP (last 3 results) No results for input(s): PROBNP in the last 8760 hours. HbA1C: No results for input(s): HGBA1C in the last 72 hours. CBG:  Recent Labs Lab 03/03/17 2114 03/04/17 0813  GLUCAP 254* 81   Lipid Profile: No results for input(s): CHOL, HDL, LDLCALC, TRIG, CHOLHDL, LDLDIRECT in the last 72 hours. Thyroid Function Tests: No results for input(s): TSH, T4TOTAL, FREET4, T3FREE, THYROIDAB in the last 72 hours. Anemia Panel: No results for input(s): VITAMINB12, FOLATE, FERRITIN, TIBC, IRON, RETICCTPCT in the last 72 hours. Sepsis Labs:  Recent Labs Lab 03/03/17 1633  LATICACIDVEN 1.2    No results found for this or any previous visit (from the past 240 hour(s)).       Radiology Studies: No results found.      Scheduled Meds: . aspirin EC  81 mg Oral Daily  . atorvastatin  80 mg Oral q1800  . furosemide  20 mg Oral Daily  . insulin  aspart  0-15 Units Subcutaneous TID WC  . insulin aspart  0-5 Units Subcutaneous QHS  . insulin glargine  80 Units Subcutaneous QHS  . isosorbide mononitrate  120 mg Oral Daily  . lisinopril  30 mg Oral Daily  . metFORMIN  1,000 mg Oral BID WC  . metoprolol tartrate  25 mg Oral BID  . primidone  50 mg Oral Daily   Continuous Infusions: .  ceFAZolin (ANCEF) IV Stopped (03/04/17 0532)     LOS: 1 day    Time spent: 35 minutes.     Rexene Alberts, MD [facility] Pager [phone]  If 7PM-7AM, please contact night-coverage [email or web address] Password [facility] 03/04/2017, 10:26 AM

## 2017-03-05 ENCOUNTER — Inpatient Hospital Stay (HOSPITAL_COMMUNITY): Payer: Medicare HMO

## 2017-03-05 DIAGNOSIS — I739 Peripheral vascular disease, unspecified: Secondary | ICD-10-CM

## 2017-03-05 DIAGNOSIS — I2510 Atherosclerotic heart disease of native coronary artery without angina pectoris: Secondary | ICD-10-CM

## 2017-03-05 DIAGNOSIS — I10 Essential (primary) hypertension: Secondary | ICD-10-CM

## 2017-03-05 DIAGNOSIS — E1151 Type 2 diabetes mellitus with diabetic peripheral angiopathy without gangrene: Secondary | ICD-10-CM

## 2017-03-05 DIAGNOSIS — Z9861 Coronary angioplasty status: Secondary | ICD-10-CM

## 2017-03-05 DIAGNOSIS — L03116 Cellulitis of left lower limb: Principal | ICD-10-CM

## 2017-03-05 DIAGNOSIS — D696 Thrombocytopenia, unspecified: Secondary | ICD-10-CM

## 2017-03-05 LAB — BASIC METABOLIC PANEL
ANION GAP: 5 (ref 5–15)
BUN: 13 mg/dL (ref 6–20)
CALCIUM: 9 mg/dL (ref 8.9–10.3)
CO2: 30 mmol/L (ref 22–32)
Chloride: 100 mmol/L — ABNORMAL LOW (ref 101–111)
Creatinine, Ser: 0.99 mg/dL (ref 0.61–1.24)
Glucose, Bld: 130 mg/dL — ABNORMAL HIGH (ref 65–99)
Potassium: 4.8 mmol/L (ref 3.5–5.1)
SODIUM: 135 mmol/L (ref 135–145)

## 2017-03-05 LAB — CBC
HCT: 49.6 % (ref 39.0–52.0)
Hemoglobin: 17 g/dL (ref 13.0–17.0)
MCH: 33 pg (ref 26.0–34.0)
MCHC: 34.3 g/dL (ref 30.0–36.0)
MCV: 96.3 fL (ref 78.0–100.0)
PLATELETS: 98 10*3/uL — AB (ref 150–400)
RBC: 5.15 MIL/uL (ref 4.22–5.81)
RDW: 15 % (ref 11.5–15.5)
WBC: 6.4 10*3/uL (ref 4.0–10.5)

## 2017-03-05 LAB — TSH: TSH: 2.904 u[IU]/mL (ref 0.350–4.500)

## 2017-03-05 LAB — GLUCOSE, CAPILLARY
GLUCOSE-CAPILLARY: 113 mg/dL — AB (ref 65–99)
GLUCOSE-CAPILLARY: 204 mg/dL — AB (ref 65–99)

## 2017-03-05 MED ORDER — CEPHALEXIN 500 MG PO CAPS
500.0000 mg | ORAL_CAPSULE | Freq: Four times a day (QID) | ORAL | Status: DC
  Administered 2017-03-05: 500 mg via ORAL
  Filled 2017-03-05: qty 1

## 2017-03-05 MED ORDER — OXYCODONE HCL 5 MG PO TABS: 5.0000 mg | ORAL_TABLET | ORAL | 0 refills | Status: DC | PRN

## 2017-03-05 MED ORDER — CEPHALEXIN 500 MG PO CAPS: 500.0000 mg | ORAL_CAPSULE | Freq: Four times a day (QID) | ORAL | 0 refills | Status: DC

## 2017-03-05 MED ORDER — OXYCODONE HCL 5 MG PO TABS
5.0000 mg | ORAL_TABLET | ORAL | Status: DC | PRN
  Administered 2017-03-05: 5 mg via ORAL

## 2017-03-05 NOTE — Discharge Summary (Addendum)
Physician Discharge Summary  Robert [name] RKY:[id number] DOB: Aug 28, 1951 DOA: 03/03/2017  PCP: Redmond School, MD  Admit date: 03/03/2017 Discharge date: 03/05/2017  Admitted From: Home Disposition:  Home  Recommendations for Outpatient Follow-up:  1. Follow up with PCP in 1-2 weeks 2. Would benefit with referral to vascular surgery  Home Health:no Equipment/Devices: N/A  Discharge Condition: Stable CODE STATUS: FULL Diet recommendation: Heart Healthy / Carb Modified   Brief/Interim Summary: 66 y.o.malewith a history of type 2 diabetes on insulin, GERD, coronary artery disease, ischemic cardiomyopathy with EF of 40-45% per cath on 7//2016, COPD, HTN, and RA on methotrexate and tocilizumab.[name] He presented to the ED with redness to left lower extremity. He was treated by his PCP with a 10 day course of doxycycline, which he finished, with minimal improvement. Patient also developed 2 blisters on his left lower extremity, approximately 3-4 cm in diameter. They have burst and started leaking clear fluid. He denied palliating or provoking factors. He denies fever, chills, nausea, vomiting, chest pain, or abdominal pain. He has a history of similar infection on the same leg earlier this year which was treated with Keflex with good results. In the ED, he was afebrile and hemodynamically stable. His white blood cell count was within normal limits. Other lab data were significant for a platelet count of 99 and glucose of 225.  Discharge Diagnoses:  Left lower extremity cellulitis, query recurrent. The patient was given vancomycin in the ED. He has a history of a negative MRSA screen. -Ancef was started for treatment-->improved clinically -home with cephalexin x 5 more days to complete one week of therapy -Dressing changes were ordered daily.  -oxycontin 5 mg, #12, one po q 4 hrs prn pain -The [place] Controlled Substance Reporting System has been queried for this patient for the  64 months prior to prescribing any opioids.  chronic LLE venous stasis changes. -Patient was given 1 dose of Lasix. -Continue dressing and supportive treatment. -seen by wound care -home with calcium alginate to nonintact wounds for absorption.  Top with ABD pad and kerilx.  Change daily. Cleanse with soap and water -concerned about underlying PVD--would benefit with referral to vascular surgery as outpt  CAD, status post angioplasty/ischemic cardiomyopathy; essential hypertension. Patient has a history of multivessel CAD, with a history of angioplasty, bare-metal stent and drug-eluting stent in the Ost Cx to Mid Cx lesion in 2016. His EF was 40-45%. - His chronic medications including isosorbide mononitrate, lisinopril, metoprolol, Lasix, Lipitor, and aspirin were continued. -Monitor his blood pressure closely to avoid hypotension.  Insulin-dependent type 2 diabetes with vascular disease. Patient is treated chronically with Lantus, metformin, and glipizide.  -Due to low-normal blood CBG this morning, holding metformin and glipizide during the hospitalization. -We will continue SSI. -6/25 HbA1C pending at time of discharge  COPD. -We'll start when necessary DuoNeb during hospitalization -stable on RA  Rheumatoid arthritis. The patient is treated with methotrexate and tocilizumab chronically. -Both were withheld on admission.  -follow up with rheumatology after d/c  Thrombocytopenia. Patient has a history of thrombocytopenia. His place line platelet count per chart review ranges from 93-105. It was  99 on admission. -The etiology is likely from RA and/or RA medications. -TSH 2.904  03/05/17--left leg lateral blister    6/25--anterior left shin     Discharge Instructions  Discharge Instructions    Diet Carb Modified    Complete by:  As directed    Increase activity slowly    Complete by:  As directed      Allergies as of 03/05/2017      Reactions   Ivp  Dye [iodinated Diagnostic Agents] Itching, Rash   Plavix [clopidogrel Bisulfate] Rash      Medication List    TAKE these medications   acetaminophen 650 MG CR tablet Commonly known as:  TYLENOL Take 650 mg by mouth every 8 (eight) hours as needed for pain.   ALPRAZolam 0.5 MG tablet Commonly known as:  XANAX Take 0.5 mg by mouth 3 (three) times daily as needed for anxiety.   AMBULATORY NON FORMULARY MEDICATION Take 600 mg by mouth daily at 12 noon. Medication Name: Dalcetrapib vs placebo What changed:  when to take this  additional instructions   aspirin 81 MG EC tablet Take 1 tablet (81 mg total) by mouth daily.   atorvastatin 80 MG tablet Commonly known as:  LIPITOR Take 1 tablet (80 mg total) by mouth daily at 6 PM.   cephALEXin 500 MG capsule Commonly known as:  KEFLEX Take 1 capsule (500 mg total) by mouth every 6 (six) hours.   fish oil-omega-3 fatty acids 1000 MG capsule Take 1 g by mouth daily.   folic acid 1 MG tablet Commonly known as:  FOLVITE Take 1 mg by mouth daily.   furosemide 20 MG tablet Commonly known as:  LASIX Take 20 mg by mouth daily.   glipiZIDE 10 MG tablet Commonly known as:  GLUCOTROL Take 10 mg by mouth 2 (two) times daily.   isosorbide mononitrate 120 MG 24 hr tablet Commonly known as:  IMDUR Take 120 mg by mouth daily.   LANTUS SOLOSTAR 100 UNIT/ML Solostar Pen Generic drug:  Insulin Glargine Inject 80 Units into the skin daily at 10 pm.   magnesium oxide 400 MG tablet Commonly known as:  MAG-OX Take 400 mg by mouth 2 (two) times daily.   metFORMIN 1000 MG tablet Commonly known as:  GLUCOPHAGE Take 1 tablet (1,000 mg total) by mouth 2 (two) times daily with a meal.   methotrexate 50 MG/2ML injection Inject 25 mg into the vein every Thursday.   metoprolol tartrate 25 MG tablet Commonly known as:  LOPRESSOR TAKE 1 TABLET TWICE DAILY   nitroGLYCERIN 0.4 MG SL tablet Commonly known as:  NITROSTAT Place 1 tablet (0.4  mg total) under the tongue every 5 (five) minutes x 3 doses as needed for chest pain.   oxyCODONE 5 MG immediate release tablet Commonly known as:  Oxy IR/ROXICODONE Take 1 tablet (5 mg total) by mouth every 4 (four) hours as needed for moderate pain. What changed:  when to take this   primidone 50 MG tablet Commonly known as:  MYSOLINE Take 50 mg by mouth daily.   quinapril 40 MG tablet Commonly known as:  ACCUPRIL Take 20 mg by mouth at bedtime. IN CONJUNCTION WITH ONE 10 MG TABLET TO EQUAL A TOTAL OF 30 MILLIGRAMS   quinapril 10 MG tablet Commonly known as:  ACCUPRIL Take 10 mg by mouth at bedtime. IN CONJUNCTION WITH ONE-HALF (20 MG) OF A 40 MG TABLET TO EQUAL A TOTAL OF 30 MILLIGRAMS   Tocilizumab 162 MG/0.9ML Sosy Inject 162 mg into the skin once a week. ACTEMRA      Follow-up Information    Serafina Mitchell, MD Follow up in 1 month(s).   Specialties:  Vascular Surgery, Cardiology Contact information: [place] [id number] [phone]        Redmond School, MD Follow up.  Specialty:  Internal Medicine Why:  Follow-up appointment on Monday, March 12, 2017 at 11:00 a.m.  Please arrive at 10:30 a.m.  Appointment will be with Nurse Practitioner. Contact information: [address] [place] [address] [phone]          Allergies  Allergen Reactions  . Ivp Dye [Iodinated Diagnostic Agents] Itching and Rash  . Plavix [Clopidogrel Bisulfate] Rash    Consultations:  none   Procedures/Studies: US Venous Img Lower Unilateral Left  Result Date: 03/05/2017 CLINICAL DATA:  66 year old with current history of peripheral vascular disease, presenting with chronic left lower extremity pain and edema and acute cellulitis. EXAM: LEFT LOWER EXTREMITY VENOUS DOPPLER ULTRASOUND TECHNIQUE: Gray-scale sonography with graded compression, as well as color Doppler and duplex ultrasound were performed to evaluate the lower extremity deep venous systems  from the level of the common femoral vein and including the common femoral, femoral, profunda femoral, popliteal and calf veins including the posterior tibial, peroneal and gastrocnemius veins when visible. The superficial great saphenous vein was also interrogated. Spectral Doppler was utilized to evaluate flow at rest and with distal augmentation maneuvers in the common femoral, femoral and popliteal veins. COMPARISON:  None. FINDINGS: Contralateral Right Common Femoral Vein: Respiratory phasicity is normal and symmetric with the symptomatic side. No evidence of thrombus. Normal compressibility. Left Common Femoral Vein: No evidence of thrombus. Normal compressibility, respiratory phasicity and response to augmentation. Saphenofemoral Junction: No evidence of thrombus. Normal compressibility and flow on color Doppler imaging. Profunda Femoral Vein: No evidence of thrombus. Normal compressibility and flow on color Doppler imaging. Femoral Vein: No evidence of thrombus. Normal compressibility, respiratory phasicity and response to augmentation. Popliteal Vein: No evidence of thrombus. Normal compressibility, respiratory phasicity and response to augmentation. Calf Veins: No evidence of thrombus. Normal compressibility and flow on color Doppler imaging. Superficial Great Saphenous Vein: No evidence of thrombus. Normal compressibility and flow on color Doppler imaging. Venous Reflux:  Not evaluated. Other Findings:  None. IMPRESSION: No evidence of DVT involving the left lower extremity. Electronically Signed   By: Evangeline Dakin M.D.   On: 03/05/2017 10:21        Discharge Exam: Vitals:   03/04/17 2100 03/05/17 0500  BP: 116/76 (!) 147/74  Pulse: 77 88  Resp: (!) 22 18  Temp: 97.6 F (36.4 C) 97.6 F (36.4 C)   Vitals:   03/04/17 0640 03/04/17 1300 03/04/17 2100 03/05/17 0500  BP: (!) 110/59 123/75 116/76 (!) 147/74  Pulse: 73 74 77 88  Resp: 18 19 (!) 22 18  Temp: 98.4 F (36.9 C) 97.9 F  (36.6 C) 97.6 F (36.4 C) 97.6 F (36.4 C)  TempSrc: Oral Oral Oral Oral  SpO2: 93% 94% 94% 97%  Weight: 110 kg (242 lb 9.6 oz)     Height:        General: Pt is alert, awake, not in acute distress Cardiovascular: RRR, S1/S2 +, no rubs, no gallops Respiratory: CTA bilaterally, no wheezing, no rhonchi Abdominal: Soft, NT, ND, bowel sounds + Extremities: no edema, no cyanosis   The results of significant diagnostics from this hospitalization (including imaging, microbiology, ancillary and laboratory) are listed below for reference.    Significant Diagnostic Studies: US Venous Img Lower Unilateral Left  Result Date: 03/05/2017 CLINICAL DATA:  66 year old with current history of peripheral vascular disease, presenting with chronic left lower extremity pain and edema and acute cellulitis. EXAM: LEFT LOWER EXTREMITY VENOUS DOPPLER ULTRASOUND TECHNIQUE: Gray-scale sonography with graded compression, as well as color  Doppler and duplex ultrasound were performed to evaluate the lower extremity deep venous systems from the level of the common femoral vein and including the common femoral, femoral, profunda femoral, popliteal and calf veins including the posterior tibial, peroneal and gastrocnemius veins when visible. The superficial great saphenous vein was also interrogated. Spectral Doppler was utilized to evaluate flow at rest and with distal augmentation maneuvers in the common femoral, femoral and popliteal veins. COMPARISON:  None. FINDINGS: Contralateral Right Common Femoral Vein: Respiratory phasicity is normal and symmetric with the symptomatic side. No evidence of thrombus. Normal compressibility. Left Common Femoral Vein: No evidence of thrombus. Normal compressibility, respiratory phasicity and response to augmentation. Saphenofemoral Junction: No evidence of thrombus. Normal compressibility and flow on color Doppler imaging. Profunda Femoral Vein: No evidence of thrombus. Normal  compressibility and flow on color Doppler imaging. Femoral Vein: No evidence of thrombus. Normal compressibility, respiratory phasicity and response to augmentation. Popliteal Vein: No evidence of thrombus. Normal compressibility, respiratory phasicity and response to augmentation. Calf Veins: No evidence of thrombus. Normal compressibility and flow on color Doppler imaging. Superficial Great Saphenous Vein: No evidence of thrombus. Normal compressibility and flow on color Doppler imaging. Venous Reflux:  Not evaluated. Other Findings:  None. IMPRESSION: No evidence of DVT involving the left lower extremity. Electronically Signed   By: Evangeline Dakin M.D.   On: 03/05/2017 10:21     Microbiology: No results found for this or any previous visit (from the past 240 hour(s)).   Labs: Basic Metabolic Panel:  Recent Labs Lab 03/03/17 1633 03/04/17 0448 03/05/17 0634  NA 137 137 135  K 3.9 3.8 4.8  CL 103 104 100*  CO2 26 28 30   GLUCOSE 225* 69 130*  BUN 15 14 13   CREATININE 0.88 0.87 0.99  CALCIUM 9.0 8.8* 9.0   Liver Function Tests: No results for input(s): AST, ALT, ALKPHOS, BILITOT, PROT, ALBUMIN in the last 168 hours. No results for input(s): LIPASE, AMYLASE in the last 168 hours. No results for input(s): AMMONIA in the last 168 hours. CBC:  Recent Labs Lab 03/03/17 1633 03/04/17 0448 03/05/17 0634  WBC 6.7 7.5 6.4  NEUTROABS 4.4  --   --   HGB 17.0 16.8 17.0  HCT 49.3 49.4 49.6  MCV 96.7 97.4 96.3  PLT 99* 106* 98*   Cardiac Enzymes: No results for input(s): CKTOTAL, CKMB, CKMBINDEX, TROPONINI in the last 168 hours. BNP: Invalid input(s): POCBNP CBG:  Recent Labs Lab 03/04/17 1132 03/04/17 1629 03/04/17 2108 03/05/17 0749 03/05/17 1138  GLUCAP 206* 239* 352* 113* 204*    Time coordinating discharge:  Greater than 30 minutes  Signed:  Keatin Benham, DO [facility] Pager: [phone] 03/05/2017, 1:29 PM

## 2017-03-05 NOTE — Progress Notes (Signed)
Inpatient Diabetes Program Recommendations  AACE/ADA: New Consensus Statement on Inpatient Glycemic Control (2015)  Target Ranges:  Prepandial:   less than 140 mg/dL      Peak postprandial:   less than 180 mg/dL (1-2 hours)      Critically ill patients:  140 - 180 mg/dL  Results for CORDERA, STINEMAN (MRN [id number]) as of 03/05/2017 07:51  Ref. Range 03/05/2017 06:34  Glucose Latest Ref Range: 65 - 99 mg/dL 130 (H)   Results for MYER, BOHLMAN (MRN [id number]) as of 03/05/2017 07:51  Ref. Range 03/04/2017 08:13 03/04/2017 11:32 03/04/2017 16:29 03/04/2017 21:08  Glucose-Capillary Latest Ref Range: 65 - 99 mg/dL 81 206 (H) 239 (H) 352 (H)   Review of Glycemic Control  Diabetes history: DM2 Outpatient Diabetes medications: Lantus 80 units QHS, Glipizide 10 mg BID, Metformin 1000 mg BID Current orders for Inpatient glycemic control: Novolog 0-15 units TID with meals, Novolog 0-5 units QHS, Lantus 80 units QHS  Inpatient Diabetes Program Recommendations: Insulin - Meal Coverage: Please consider ordering Novolog 5 units TID with meals for meal coverage if patient eats at least 50% of meals.  Thanks, Barnie Alderman, RN, MSN, CDE Diabetes Coordinator Inpatient Diabetes Program [phone] (Team Pager from 8am to 5pm)

## 2017-03-05 NOTE — Progress Notes (Signed)
Patient's dressing changed prior to discharge.  Dressing CDI on discharge.  IV removed.  Site WNL.  AVS reviewed with patient.  Verbalized understanding of discharge instructions, physician follow-up, medications.  Prescription for pain medication given to patient.  Patient transported by NT via w/c to main entrance at discharge.  Patient stable at time of discharge.

## 2017-03-05 NOTE — Consult Note (Signed)
[facility] Nurse wound consult note Reason for Consult: Left anterior lower leg, rupture serum filled blisters.  Cellulitis to left lower extremity.  Wound type: Infectious Pressure Injury POA: N/A Measurement: 2 nonintact areas  3 cm x 2 cm x 0.2 cm  Wound MWN:UUVO and moist Drainage (amount, consistency, odor) moderate serosanguinous  No odor Periwound:Erythema Dressing procedure/placement/frequency:Cleanse left lower leg with soap and water.  Apply calcium alginate to nonintact wounds for absorption.  Top with ABD pad and kerilx.  Change daily.  Will not follow at this time.  Please re-consult if needed.  Domenic Moras RN BSN [place] Pager [phone]

## 2017-03-06 LAB — HEMOGLOBIN A1C
HEMOGLOBIN A1C: 8.8 % — AB (ref 4.8–5.6)
MEAN PLASMA GLUCOSE: 206 mg/dL

## 2017-03-08 DIAGNOSIS — Z6833 Body mass index (BMI) 33.0-33.9, adult: Secondary | ICD-10-CM | POA: Diagnosis not present

## 2017-03-08 DIAGNOSIS — E1151 Type 2 diabetes mellitus with diabetic peripheral angiopathy without gangrene: Secondary | ICD-10-CM | POA: Diagnosis not present

## 2017-03-08 DIAGNOSIS — L03116 Cellulitis of left lower limb: Secondary | ICD-10-CM | POA: Diagnosis not present

## 2017-03-08 DIAGNOSIS — I739 Peripheral vascular disease, unspecified: Secondary | ICD-10-CM | POA: Diagnosis not present

## 2017-03-12 DIAGNOSIS — S22030D Wedge compression fracture of third thoracic vertebra, subsequent encounter for fracture with routine healing: Secondary | ICD-10-CM | POA: Diagnosis not present

## 2017-03-12 DIAGNOSIS — S62021D Displaced fracture of middle third of navicular [scaphoid] bone of right wrist, subsequent encounter for fracture with routine healing: Secondary | ICD-10-CM | POA: Diagnosis not present

## 2017-03-13 ENCOUNTER — Telehealth: Payer: Self-pay | Admitting: *Deleted

## 2017-03-13 NOTE — Telephone Encounter (Signed)
I called patient for follow-up in [employer] Study. Patient has been treated for cellulitis of left lower leg. Patient stated he was feeling better. Patient stated he was doing well with study medication.. I scheduled patient for 63-month study visit on August 21,2018 at 10:00am for Dal-Gene Study.

## 2017-03-14 DIAGNOSIS — J449 Chronic obstructive pulmonary disease, unspecified: Secondary | ICD-10-CM | POA: Diagnosis not present

## 2017-03-22 DIAGNOSIS — E782 Mixed hyperlipidemia: Secondary | ICD-10-CM | POA: Diagnosis not present

## 2017-03-22 DIAGNOSIS — E6609 Other obesity due to excess calories: Secondary | ICD-10-CM | POA: Diagnosis not present

## 2017-03-22 DIAGNOSIS — I10 Essential (primary) hypertension: Secondary | ICD-10-CM | POA: Diagnosis not present

## 2017-03-22 DIAGNOSIS — E119 Type 2 diabetes mellitus without complications: Secondary | ICD-10-CM | POA: Diagnosis not present

## 2017-03-22 DIAGNOSIS — L98499 Non-pressure chronic ulcer of skin of other sites with unspecified severity: Secondary | ICD-10-CM | POA: Diagnosis not present

## 2017-03-22 DIAGNOSIS — I70221 Atherosclerosis of native arteries of extremities with rest pain, right leg: Secondary | ICD-10-CM | POA: Diagnosis not present

## 2017-03-22 DIAGNOSIS — Z87891 Personal history of nicotine dependence: Secondary | ICD-10-CM | POA: Diagnosis not present

## 2017-03-22 DIAGNOSIS — M79605 Pain in left leg: Secondary | ICD-10-CM | POA: Diagnosis not present

## 2017-03-22 DIAGNOSIS — Z6833 Body mass index (BMI) 33.0-33.9, adult: Secondary | ICD-10-CM | POA: Diagnosis not present

## 2017-03-22 DIAGNOSIS — I739 Peripheral vascular disease, unspecified: Secondary | ICD-10-CM | POA: Diagnosis not present

## 2017-03-26 ENCOUNTER — Encounter: Payer: Self-pay | Admitting: Vascular Surgery

## 2017-03-26 ENCOUNTER — Other Ambulatory Visit: Payer: Self-pay

## 2017-03-26 DIAGNOSIS — I739 Peripheral vascular disease, unspecified: Secondary | ICD-10-CM

## 2017-03-27 ENCOUNTER — Encounter: Payer: Self-pay | Admitting: Vascular Surgery

## 2017-03-27 ENCOUNTER — Ambulatory Visit (HOSPITAL_COMMUNITY)
Admission: RE | Admit: 2017-03-27 | Discharge: 2017-03-27 | Disposition: A | Discharge status: Home / Self Care | Payer: Medicare HMO | Source: Ambulatory Visit | Attending: Vascular Surgery | Admitting: Vascular Surgery

## 2017-03-27 ENCOUNTER — Ambulatory Visit (INDEPENDENT_AMBULATORY_CARE_PROVIDER_SITE_OTHER): Payer: Medicare HMO | Admitting: Vascular Surgery

## 2017-03-27 VITALS — BP 139/79 | HR 73 | Temp 98.7°F | Resp 18 | Ht 72.0 in | Wt 251.0 lb

## 2017-03-27 DIAGNOSIS — L97221 Non-pressure chronic ulcer of left calf limited to breakdown of skin: Secondary | ICD-10-CM | POA: Diagnosis not present

## 2017-03-27 DIAGNOSIS — I872 Venous insufficiency (chronic) (peripheral): Secondary | ICD-10-CM

## 2017-03-27 DIAGNOSIS — I739 Peripheral vascular disease, unspecified: Secondary | ICD-10-CM | POA: Insufficient documentation

## 2017-03-27 LAB — VAS US LOWER EXTREMITY ARTERIAL DUPLEX
LATIBDISTSYS: 35 cm/s
LSFDPSV: -350 cm/s
LSFMPSV: 69 cm/s
LSFPPSV: 161 cm/s
RIGHT ANT DIST TIBAL SYS PSV: 33 cm/s
RSFPPSV: 143 cm/s
RTIBDISTSYS: 86 cm/s
Right super femoral dist sys PSV: -136 cm/s
Right super femoral mid sys PSV: -114 cm/s
left post tibial dist sys: 27 cm/s

## 2017-03-27 MED ORDER — SILVER SULFADIAZINE 1 % EX CREA: 1.0000 "application " | TOPICAL_CREAM | Freq: Every day | CUTANEOUS | 0 refills | Status: DC

## 2017-03-27 NOTE — Progress Notes (Signed)
[facility] [place]  Patient name: Robert [name] MRN: [id number] DOB: November 07, 1950 Sex: male  REASON FOR VISIT: PAD, left leg wound  HPI:    Robert [name] is a 66 y.o. male, who presents with 8 month history of left leg wounds to the medial gaiter area and pretibial area. He reports having fallen in November 2017 hunting and scraping his left leg. He denies any significant swelling to his legs until he had a fall at the end of March 2018 resulting in a left frontal skull fracture, T3 fracture and right radius fracture. His left legs wounds are not painful. His wounds have been the same without getting better or worse. He only keeps a bandage to his left leg. No other wound care. Denies any prior history of wounds. [place] blister to his wound that burst, prompting him to have his leg evaluated at [facility] facility.   He reports some claudication to his left calf after ambulating a block. He reports some cramping in his left foot at night that is relieved with standing up. He denies any issues with his right leg. He has seen Dr. Gwenlyn Found in the past (6-8 years ago) where he had arteriography and possible angioplasty to his left leg. No records of this are available.   Past medical history includes CAD s/p stenting, hypertension on multiple antihypertensives, hyperlipidemia managed on a statin, diabetes mellitus managed on insulin and oral hypoglycemics and rheumatoid arthritis. Takes a daily aspirin.   He is a former smoker quitting in 2016.   PAST MEDICAL HISTORY:   Past Medical History:  Diagnosis Date  . Anxiety   . COPD (chronic obstructive pulmonary disease) ([place])   . Coronary artery disease   . Full dentures   . GERD (gastroesophageal reflux disease)    Rolaids as needed  . High cholesterol   . History of MI (myocardial infarction)   . Hypertension    med. dosage increased 04/2016; has been on BP med. x 10 yrs.  . Incarcerated  umbilical hernia 37/6283  . Inguinal hernia 05/2016   bilateral   . Insulin dependent diabetes mellitus ([place])   . Ischemic cardiomyopathy    a. 03/2015 EF 40-45% by LV gram.  . Left leg cellulitis 10/08/2016  . Rheumatoid arthritis(714.0)     Family History  Problem Relation Age of Onset  . Colon cancer Father   . Hypertension Mother   . Heart disease Mother   . Cancer Mother     SOCIAL HISTORY:   Social History   Social History  . Marital status: Married    Spouse name: N/A  . Number of children: N/A  . Years of education: N/A   Occupational History  . tractor Recruitment consultant    Social History Main Topics  . Smoking status: Current Every Day Smoker    Packs/day: 0.50    Last attempt to quit: 06/11/2015  . Smokeless tobacco: Never Used  . Alcohol use No  . Drug use: No  . Sexual activity: Yes   Other Topics Concern  . Not on file   Social History Narrative  . No narrative on file    Allergies  Allergen Reactions  . Ivp Dye [Iodinated Diagnostic Agents] Itching and Rash  . Plavix [Clopidogrel Bisulfate] Rash    Current Outpatient Prescriptions  Medication Sig Dispense Refill  . acetaminophen (TYLENOL) 650 MG CR tablet Take 650 mg by mouth  every 8 (eight) hours as needed for pain.    [name] ALPRAZolam (XANAX) 0.5 MG tablet Take 0.5 mg by mouth 3 (three) times daily as needed for anxiety.     . AMBULATORY NON FORMULARY MEDICATION Take 600 mg by mouth daily at 12 noon. Medication Name: Dalcetrapib vs placebo (Patient taking differently: Take 600 mg by mouth every evening. Medication Name: Dalcetrapib vs placebo: Cholesterol)    . aspirin EC 81 MG EC tablet Take 1 tablet (81 mg total) by mouth daily.    [name] atorvastatin (LIPITOR) 80 MG tablet Take 1 tablet (80 mg total) by mouth daily at 6 PM. 90 tablet 3  . fish oil-omega-3 fatty acids 1000 MG capsule Take 1 g by mouth daily.     . folic acid (FOLVITE) 1 MG tablet Take 1 mg by mouth daily.     . furosemide (LASIX) 20  MG tablet Take 20 mg by mouth daily.     [name] gabapentin (NEURONTIN) 300 MG capsule     . glipiZIDE (GLUCOTROL) 10 MG tablet Take 10 mg by mouth 2 (two) times daily.    . Insulin Glargine (LANTUS SOLOSTAR) 100 UNIT/ML Solostar Pen Inject 80 Units into the skin daily at 10 pm.    . isosorbide mononitrate (IMDUR) 120 MG 24 hr tablet Take 120 mg by mouth daily.    . magnesium oxide (MAG-OX) 400 MG tablet Take 400 mg by mouth 2 (two) times daily.    . metFORMIN (GLUCOPHAGE) 1000 MG tablet Take 1 tablet (1,000 mg total) by mouth 2 (two) times daily with a meal.    . methotrexate 50 MG/2ML injection Inject 25 mg into the vein every Thursday.    . metoprolol tartrate (LOPRESSOR) 25 MG tablet TAKE 1 TABLET TWICE DAILY 180 tablet 3  . nitroGLYCERIN (NITROSTAT) 0.4 MG SL tablet Place 1 tablet (0.4 mg total) under the tongue every 5 (five) minutes x 3 doses as needed for chest pain. 25 tablet 2  . primidone (MYSOLINE) 50 MG tablet Take 50 mg by mouth daily.    . quinapril (ACCUPRIL) 10 MG tablet Take 10 mg by mouth at bedtime. IN CONJUNCTION WITH ONE-HALF (20 MG) OF A 40 MG TABLET TO EQUAL A TOTAL OF 30 MILLIGRAMS    . quinapril (ACCUPRIL) 40 MG tablet Take 20 mg by mouth at bedtime. IN CONJUNCTION WITH ONE 10 MG TABLET TO EQUAL A TOTAL OF 30 MILLIGRAMS    . Tocilizumab 162 MG/0.9ML SOSY Inject 162 mg into the skin once a week. ACTEMRA    . acarbose (PRECOSE) 50 MG tablet     . cephALEXin (KEFLEX) 500 MG capsule Take 1 capsule (500 mg total) by mouth every 6 (six) hours. (Patient not taking: Reported on 03/27/2017) 20 capsule 0  . doxycycline (VIBRA-TABS) 100 MG tablet     . oxyCODONE (OXY IR/ROXICODONE) 5 MG immediate release tablet Take 1 tablet (5 mg total) by mouth every 4 (four) hours as needed for moderate pain. (Patient not taking: Reported on 03/27/2017) 12 tablet 0   No current facility-administered medications for this visit.     REVIEW OF SYSTEMS:   REVIEW OF SYSTEMS (negative unless checked):    Cardiac:  []  Chest pain or chest pressure? []  Shortness of breath upon activity? []  Shortness of breath when lying flat? []  Irregular heart rhythm?  Vascular:  [x]  Pain in calf, thigh, or hip brought on by walking? [x]  Pain in feet at night that wakes you up from your sleep? []   Blood clot in your veins? [x]  Leg swelling?  Pulmonary:  []  Oxygen at home? []  Productive cough? []  Wheezing?  Neurologic:  []  Sudden weakness in arms or legs? []  Sudden numbness in arms or legs? []  Sudden onset of difficult speaking or slurred speech? []  Temporary loss of vision in one eye? []  Problems with dizziness?  Gastrointestinal:  []  Blood in stool? []  Vomited blood?  Genitourinary:  []  Burning when urinating? []  Blood in urine?  Psychiatric:  []  Major depression  Hematologic:  []  Bleeding problems? []  Problems with blood clotting?  Dermatologic:  []  Rashes or ulcers?  Constitutional:  []  Fever or chills?  Ear/Nose/Throat:  []  Change in hearing? []  Nose bleeds? []  Sore throat?  Musculoskeletal:  []  Back pain? []  Joint pain? []  Muscle pain?  PHYSICAL EXAM:   Vitals:   03/27/17 1353  BP: 139/79  Pulse: 73  Resp: 18  Temp: 98.7 F (37.1 C)  SpO2: 93%  Weight: 251 lb (113.9 kg)  Height: 6' (1.829 m)    GENERAL: The patient is a well-nourished male, in no acute distress. The vital signs are documented above. CARDIAC: There is a regular rate and rhythm. No carotid bruits.  VASCULAR: Palpable radial pulses bilaterally. Palpable left femoral pulse. Non palpable right femoral pulse. Non palpable popliteal and pedal pulses bilaterally. Large superficial ulceration to left medial gaiter area and left pretibial area with excoriation. No gangrene. Sonosite exam left leg revealed dilated great saphenous vein.  PULMONARY: There is good air exchange bilaterally without wheezing or rales. ABDOMEN: Soft and non-tender. Obese.   MUSCULOSKELETAL: 2+ pitting edema bilaterally.   NEUROLOGIC: No focal weakness or paresthesias are detected. SKIN: Venous stasis changes left leg.  PSYCHIATRIC: The patient has a normal affect.  DATA:    ABIs 03/27/17  R: 1.21 with triphasic waveforms L: 0.61; TBI: 0.33 monophasic superficial femoral, popliteal and tibial arteries. Moderate left SFA stenosis.  Left CFA is 1.9 cm AP by 1.9 cm transverse  ASSESSMENT/PLAN:   Lower extremity venous stasis ulcer Moderate peripheral arterial disease left leg  The patient does have some evidence of PAD as demonstrated by claudication symptoms and his ABIs. However his left leg wound appears to be more consistent with venous disease. He does have some venous stasis changes and swelling to his left leg. Have recommended wound care with silvadene to left leg open sores and compression with ACE wrap daily. Will have him follow-up in two weeks to evaluate wound and to obtain a venous reflux study.   He may require future laser ablation and/or workup of arterial insufficiency based on how his wound progresses.   Virgina Jock, PA-C [facility] MD: Early  I have examined the patient, reviewed and agree with above. Appears to have mixed arterial and venous incompetence in his left leg. His open ulceration is more consistent with venous stasis disease. Does have known arterial insufficiency as well. Will begin compression and topical treatment with Silvadene. See him in several weeks with formal venous duplex evaluation.  Curt Jews, MD 03/27/2017 2:57 PM

## 2017-03-29 ENCOUNTER — Encounter: Payer: Self-pay | Admitting: Internal Medicine

## 2017-04-02 ENCOUNTER — Encounter: Payer: Self-pay | Admitting: Vascular Surgery

## 2017-04-03 ENCOUNTER — Other Ambulatory Visit: Payer: Self-pay | Admitting: Cardiovascular Disease

## 2017-04-10 ENCOUNTER — Ambulatory Visit (HOSPITAL_COMMUNITY)
Admission: RE | Admit: 2017-04-10 | Discharge: 2017-04-10 | Disposition: A | Discharge status: Home / Self Care | Payer: Medicare HMO | Source: Ambulatory Visit | Attending: Vascular Surgery | Admitting: Vascular Surgery

## 2017-04-10 DIAGNOSIS — L97221 Non-pressure chronic ulcer of left calf limited to breakdown of skin: Secondary | ICD-10-CM | POA: Insufficient documentation

## 2017-04-10 DIAGNOSIS — I872 Venous insufficiency (chronic) (peripheral): Secondary | ICD-10-CM | POA: Insufficient documentation

## 2017-04-10 DIAGNOSIS — R609 Edema, unspecified: Secondary | ICD-10-CM | POA: Diagnosis present

## 2017-04-11 ENCOUNTER — Other Ambulatory Visit: Payer: Self-pay | Admitting: *Deleted

## 2017-04-14 DIAGNOSIS — J449 Chronic obstructive pulmonary disease, unspecified: Secondary | ICD-10-CM | POA: Diagnosis not present

## 2017-04-16 ENCOUNTER — Other Ambulatory Visit: Payer: Self-pay | Admitting: Cardiovascular Disease

## 2017-04-17 ENCOUNTER — Encounter: Payer: Self-pay | Admitting: Vascular Surgery

## 2017-04-17 ENCOUNTER — Ambulatory Visit (INDEPENDENT_AMBULATORY_CARE_PROVIDER_SITE_OTHER): Payer: Medicare HMO | Admitting: Vascular Surgery

## 2017-04-17 VITALS — BP 143/82 | HR 77 | Temp 97.7°F | Resp 20 | Ht 72.0 in | Wt 254.0 lb

## 2017-04-17 DIAGNOSIS — L97221 Non-pressure chronic ulcer of left calf limited to breakdown of skin: Secondary | ICD-10-CM | POA: Diagnosis not present

## 2017-04-17 DIAGNOSIS — I872 Venous insufficiency (chronic) (peripheral): Secondary | ICD-10-CM | POA: Diagnosis not present

## 2017-04-17 NOTE — Progress Notes (Signed)
V[facility] and Vein Specialist of [place]  Patient name: Robert [name] MRN: [id number] DOB: 01-Jul-1951 Sex: male  REASON FOR VISIT: Returns today for continued follow-up of his left leg.  HPI: Robert [name] is a 66 y.o. male has had marked improvement with compression Silvadene now completely healed the open ulcerations been present for many months. He is quite pleased with this.  Past Medical History:  Diagnosis Date  . Anxiety   . COPD (chronic obstructive pulmonary disease) ([place])   . Coronary artery disease   . Full dentures   . GERD (gastroesophageal reflux disease)    Rolaids as needed  . High cholesterol   . History of MI (myocardial infarction)   . Hypertension    med. dosage increased 04/2016; has been on BP med. x 10 yrs.  . Incarcerated umbilical hernia 19/6222  . Inguinal hernia 05/2016   bilateral   . Insulin dependent diabetes mellitus ([place])   . Ischemic cardiomyopathy    a. 03/2015 EF 40-45% by LV gram.  . Left leg cellulitis 10/08/2016  . Rheumatoid arthritis(714.0)     Family History  Problem Relation Age of Onset  . Colon cancer Father   . Hypertension Mother   . Heart disease Mother   . Cancer Mother     SOCIAL HISTORY: Social History  Substance Use Topics  . Smoking status: Current Every Day Smoker    Packs/day: 0.50    Last attempt to quit: 06/11/2015  . Smokeless tobacco: Never Used  . Alcohol use No    Allergies  Allergen Reactions  . Ivp Dye [Iodinated Diagnostic Agents] Itching and Rash  . Plavix [Clopidogrel Bisulfate] Rash    Current Outpatient Prescriptions  Medication Sig Dispense Refill  . acarbose (PRECOSE) 50 MG tablet     . acetaminophen (TYLENOL) 650 MG CR tablet Take 650 mg by mouth every 8 (eight) hours as needed for pain.    [name] ALPRAZolam (XANAX) 0.5 MG tablet Take 0.5 mg by mouth 3 (three) times daily as needed for anxiety.     . AMBULATORY NON FORMULARY MEDICATION Take 600 mg by mouth  daily at 12 noon. Medication Name: Dalcetrapib vs placebo (Patient taking differently: Take 600 mg by mouth every evening. Medication Name: Dalcetrapib vs placebo: Cholesterol)    . aspirin EC 81 MG EC tablet Take 1 tablet (81 mg total) by mouth daily.    [name] atorvastatin (LIPITOR) 80 MG tablet TAKE 1 TABLET DAILY AT 6 PM. 90 tablet 1  . doxycycline (VIBRA-TABS) 100 MG tablet     . fish oil-omega-3 fatty acids 1000 MG capsule Take 1 g by mouth daily.     . folic acid (FOLVITE) 1 MG tablet Take 1 mg by mouth daily.     . furosemide (LASIX) 20 MG tablet Take 20 mg by mouth daily.     [name] gabapentin (NEURONTIN) 300 MG capsule     . glipiZIDE (GLUCOTROL) 10 MG tablet Take 10 mg by mouth 2 (two) times daily.    . Insulin Glargine (LANTUS SOLOSTAR) 100 UNIT/ML Solostar Pen Inject 80 Units into the skin daily at 10 pm.    . isosorbide mononitrate (IMDUR) 120 MG 24 hr tablet Take 120 mg by mouth daily.    . magnesium oxide (MAG-OX) 400 MG tablet Take 400 mg by mouth 2 (two) times daily.    . metFORMIN (GLUCOPHAGE) 1000 MG tablet Take 1 tablet (1,000 mg total) by mouth 2 (two) times daily with a meal.    .  methotrexate 50 MG/2ML injection Inject 25 mg into the vein every Thursday.    . metoprolol tartrate (LOPRESSOR) 25 MG tablet TAKE 1 TABLET TWICE DAILY 180 tablet 3  . nitroGLYCERIN (NITROSTAT) 0.4 MG SL tablet Place 1 tablet (0.4 mg total) under the tongue every 5 (five) minutes x 3 doses as needed for chest pain. 25 tablet 2  . oxyCODONE (OXY IR/ROXICODONE) 5 MG immediate release tablet Take 1 tablet (5 mg total) by mouth every 4 (four) hours as needed for moderate pain. 12 tablet 0  . primidone (MYSOLINE) 50 MG tablet Take 50 mg by mouth daily.    . quinapril (ACCUPRIL) 10 MG tablet Take 10 mg by mouth at bedtime. IN CONJUNCTION WITH ONE-HALF (20 MG) OF A 40 MG TABLET TO EQUAL A TOTAL OF 30 MILLIGRAMS    . quinapril (ACCUPRIL) 40 MG tablet Take 20 mg by mouth at bedtime. IN CONJUNCTION WITH ONE 10 MG  TABLET TO EQUAL A TOTAL OF 30 MILLIGRAMS    . silver sulfADIAZINE (SILVADENE) 1 % cream Apply 1 application topically daily. Apply to open wounds on left leg daily. Cover with gauze. Wrap left leg snugly with ACE wrap daily. 50 g 0  . Tocilizumab 162 MG/0.9ML SOSY Inject 162 mg into the skin once a week. ACTEMRA    . cephALEXin (KEFLEX) 500 MG capsule Take 1 capsule (500 mg total) by mouth every 6 (six) hours. (Patient not taking: Reported on 03/27/2017) 20 capsule 0   No current facility-administered medications for this visit.     REVIEW OF SYSTEMS:  [X]  denotes positive finding, [ ]  denotes negative finding Cardiac  Comments:  Chest pain or chest pressure:    Shortness of breath upon exertion:    Short of breath when lying flat:    Irregular heart rhythm:        Vascular    Pain in calf, thigh, or hip brought on by ambulation:    Pain in feet at night that wakes you up from your sleep:     Blood clot in your veins:    Leg swelling:  x         PHYSICAL EXAM: Vitals:   04/17/17 0829 04/17/17 0832  BP: (!) 146/78 (!) 143/82  Pulse: 77   Resp: 20   Temp: 97.7 F (36.5 C)   TempSrc: Oral   SpO2: 96%   Weight: 254 lb (115.2 kg)   Height: 6' (1.829 m)     GENERAL: The patient is a well-nourished male, in no acute distress. The vital signs are documented above. CARDIOVASCULAR: No palpable distal pulses PULMONARY: There is good air exchange  MUSCULOSKELETAL: There are no major deformities or cyanosis. NEUROLOGIC: No focal weakness or paresthesias are detected. SKIN: Circumferential skin changes consistent with chronic venous stasis disease in his left leg. Bilateral lower from the swelling PSYCHIATRIC: The patient has a normal affect.  DATA:  He underwent noninvasive venous duplex evaluation on 03/23/2017. I have reviewed this with the patient and his wife this morning. This does show reflux throughout his enlarged great saphenous vein. Also has some deep venous  reflux.  MEDICAL ISSUES: Severe venous stasis disease with healed ulceration. NowCEAP5 . Have recommended laser ablation of his left great saphenous vein. He has been extremely compliant with his compression and developed prolonged ulceration despite this. Did also explained the lifelong need of a compression garments following the procedure related to his deep venous insufficiency. He wishes to schedule this as soon as possible  Rosetta Posner, MD FACS [facility] Tel [phone] Pager [phone]

## 2017-04-25 ENCOUNTER — Other Ambulatory Visit: Payer: Self-pay | Admitting: *Deleted

## 2017-04-25 ENCOUNTER — Other Ambulatory Visit: Payer: Self-pay | Admitting: Cardiovascular Disease

## 2017-04-25 DIAGNOSIS — I83812 Varicose veins of left lower extremities with pain: Secondary | ICD-10-CM

## 2017-04-30 ENCOUNTER — Encounter (HOSPITAL_COMMUNITY): Payer: Medicare HMO

## 2017-05-01 ENCOUNTER — Encounter: Payer: Self-pay | Admitting: *Deleted

## 2017-05-01 ENCOUNTER — Encounter: Payer: Self-pay | Admitting: Vascular Surgery

## 2017-05-01 DIAGNOSIS — Z006 Encounter for examination for normal comparison and control in clinical research program: Secondary | ICD-10-CM

## 2017-05-01 NOTE — Progress Notes (Signed)
I saw patient for 24 month Dal-Gene study visit. Patient is doing well and tolerating study drug well. I dispensed new box of study medication and patient returned old box of study medication with 94% compliance rate. I reconsented patient with latest version of consent dated April 25,2018. I explained new consent to patient and allowed patient a chance to ask questions. I gave patient a new copy of consent. I will see patient back in 6 month for next Dal-Gene study visit.

## 2017-05-03 ENCOUNTER — Ambulatory Visit (INDEPENDENT_AMBULATORY_CARE_PROVIDER_SITE_OTHER): Payer: Medicare HMO | Admitting: Vascular Surgery

## 2017-05-03 ENCOUNTER — Encounter: Payer: Self-pay | Admitting: Vascular Surgery

## 2017-05-03 ENCOUNTER — Encounter: Payer: Medicare HMO | Admitting: Vascular Surgery

## 2017-05-03 VITALS — BP 136/73 | HR 78 | Temp 97.6°F | Resp 18 | Ht 72.0 in | Wt 246.0 lb

## 2017-05-03 DIAGNOSIS — I872 Venous insufficiency (chronic) (peripheral): Secondary | ICD-10-CM

## 2017-05-03 DIAGNOSIS — L97221 Non-pressure chronic ulcer of left calf limited to breakdown of skin: Secondary | ICD-10-CM | POA: Diagnosis not present

## 2017-05-03 DIAGNOSIS — I868 Varicose veins of other specified sites: Secondary | ICD-10-CM

## 2017-05-03 NOTE — Progress Notes (Signed)
     Laser Ablation Procedure    Date: 05/03/2017   Robert [name] DOB:[birth year]  Consent signed: Yes    Surgeon:  Dr. Sherren Mocha Maat Kafer  Procedure: Laser Ablation: left Greater Saphenous Vein  BP 136/73   Pulse 78   Temp 97.6 F (36.4 C)   Resp 18   Ht 6' (1.829 m)   Wt 246 lb (111.6 kg)   SpO2 96%   BMI 33.36 kg/m   Tumescent Anesthesia: 300 cc 0.9% NaCl with 50 cc Lidocaine HCL with 1% Epi and 15 cc 8.4% NaHCO3  Local Anesthesia: 3 cc Lidocaine HCL and NaHCO3 (ratio 2:1)  15 watts continuous mode        Total energy: 2492   Total time: 2:44    Patient tolerated procedure well  Notes:   Description of Procedure:  After marking the course of the secondary varicosities, the patient was placed on the operating table in the supine position, and the left leg was prepped and draped in sterile fashion.   Local anesthetic was administered and under ultrasound guidance the saphenous vein was accessed with a micro needle and guide wire; then the mirco puncture sheath was placed.  A guide wire was inserted saphenofemoral junction , followed by a 5 french sheath.  The position of the sheath and then the laser fiber below the junction was confirmed using the ultrasound.  Tumescent anesthesia was administered along the course of the saphenous vein using ultrasound guidance. The patient was placed in Trendelenburg position and protective laser glasses were placed on patient and staff, and the laser was fired at 15 watts continuous mode advancing 1-12mm/second for a total of 2492 joules.     Steri strips were applied to the stab wounds and ABD pads and thigh high compression stockings were applied.  Ace wrap bandages were applied over the phlebectomy sites and at the top of the saphenofemoral junction. Blood loss was less than 15 cc.  The patient ambulated out of the operating room having tolerated the procedure well.  Uneventful ablation from mid calf to just below saphenofemoral junction.

## 2017-05-07 ENCOUNTER — Encounter: Payer: Self-pay | Admitting: Vascular Surgery

## 2017-05-09 ENCOUNTER — Encounter: Payer: Self-pay | Admitting: Vascular Surgery

## 2017-05-15 ENCOUNTER — Ambulatory Visit (INDEPENDENT_AMBULATORY_CARE_PROVIDER_SITE_OTHER): Payer: Medicare HMO | Admitting: Vascular Surgery

## 2017-05-15 ENCOUNTER — Ambulatory Visit (HOSPITAL_COMMUNITY)
Admission: RE | Admit: 2017-05-15 | Discharge: 2017-05-15 | Disposition: A | Discharge status: Home / Self Care | Payer: Medicare HMO | Source: Ambulatory Visit | Attending: Vascular Surgery | Admitting: Vascular Surgery

## 2017-05-15 ENCOUNTER — Encounter: Payer: Self-pay | Admitting: Vascular Surgery

## 2017-05-15 VITALS — BP 125/75 | HR 82 | Temp 97.8°F | Resp 20 | Ht 72.0 in | Wt 246.0 lb

## 2017-05-15 DIAGNOSIS — Z9889 Other specified postprocedural states: Secondary | ICD-10-CM | POA: Insufficient documentation

## 2017-05-15 DIAGNOSIS — I83892 Varicose veins of left lower extremities with other complications: Secondary | ICD-10-CM | POA: Diagnosis not present

## 2017-05-15 DIAGNOSIS — I83812 Varicose veins of left lower extremities with pain: Secondary | ICD-10-CM | POA: Insufficient documentation

## 2017-05-15 NOTE — Progress Notes (Signed)
Subjective:     Patient ID: Robert [name], male   DOB: [birth year], 66 y.o.   MRN: [id number]  HPI This 66 year old male returns 1 week post-laser ablation left great saphenous vein for gross reflux and swelling with recently healed venous stasis ulcer left ankle and pretibial region. He has had some mild-to-moderate discomfort along the course of the great saphenous vein which is improving. He did take ibuprofen as instructed and where his long leg elastic compression stocking. He edema in the ankle is about the same as before. He has no complaints.  Past Medical History:  Diagnosis Date  . Anxiety   . COPD (chronic obstructive pulmonary disease) ([place])   . Coronary artery disease   . Full dentures   . GERD (gastroesophageal reflux disease)    Rolaids as needed  . High cholesterol   . History of MI (myocardial infarction)   . Hypertension    med. dosage increased 04/2016; has been on BP med. x 10 yrs.  . Incarcerated umbilical hernia [id number]/[id number]  . Inguinal hernia 05/2016   bilateral   . Insulin dependent diabetes mellitus ([place])   . Ischemic cardiomyopathy    a. 03/2015 EF 40-45% by LV gram.  . Left leg cellulitis 10/08/2016  . Rheumatoid arthritis(714.0)     Social History  Substance Use Topics  . Smoking status: Current Every Day Smoker    Packs/day: 0.50    Last attempt to quit: 06/11/2015  . Smokeless tobacco: Never Used  . Alcohol use No    Family History  Problem Relation Age of Onset  . Colon cancer Father   . Hypertension Mother   . Heart disease Mother   . Cancer Mother     Allergies  Allergen Reactions  . Ivp Dye [Iodinated Diagnostic Agents] Itching and Rash  . Plavix [Clopidogrel Bisulfate] Rash     Current Outpatient Prescriptions:  .  acarbose (PRECOSE) 50 MG tablet, , Disp: , Rfl:  .  acetaminophen (TYLENOL) 650 MG CR tablet, Take 650 mg by mouth every 8 (eight) hours as needed for pain., Disp: , Rfl:  .  ALPRAZolam (XANAX) 0.5 MG tablet, Take 0.5 mg by  mouth 3 (three) times daily as needed for anxiety. , Disp: , Rfl:  .  AMBULATORY NON FORMULARY MEDICATION, Take 600 mg by mouth daily at 12 noon. Medication Name: Dalcetrapib vs placebo (Patient taking differently: Take 600 mg by mouth every evening. Medication Name: Dalcetrapib vs placebo: Cholesterol), Disp: , Rfl:  .  aspirin EC 81 MG EC tablet, Take 1 tablet (81 mg total) by mouth daily., Disp: , Rfl:  .  atorvastatin (LIPITOR) 80 MG tablet, TAKE 1 TABLET DAILY AT 6 PM., Disp: 90 tablet, Rfl: 1 .  doxycycline (VIBRA-TABS) 100 MG tablet, , Disp: , Rfl:  .  fish oil-omega-3 fatty acids 1000 MG capsule, Take 1 g by mouth daily. , Disp: , Rfl:  .  folic acid (FOLVITE) 1 MG tablet, Take 1 mg by mouth daily. , Disp: , Rfl:  .  furosemide (LASIX) 20 MG tablet, Take 20 mg by mouth daily. , Disp: , Rfl:  .  gabapentin (NEURONTIN) 300 MG capsule, , Disp: , Rfl:  .  glipiZIDE (GLUCOTROL) 10 MG tablet, Take 10 mg by mouth 2 (two) times daily., Disp: , Rfl:  .  Insulin Glargine (LANTUS SOLOSTAR) 100 UNIT/ML Solostar Pen, Inject 80 Units into the skin daily at 10 pm., Disp: , Rfl:  .  isosorbide mononitrate (IMDUR) 120 MG 24  hr tablet, TAKE 1 TABLET (120 MG TOTAL) BY MOUTH DAILY., Disp: 90 tablet, Rfl: 1 .  magnesium oxide (MAG-OX) 400 MG tablet, Take 400 mg by mouth 2 (two) times daily., Disp: , Rfl:  .  metFORMIN (GLUCOPHAGE) 1000 MG tablet, Take 1 tablet (1,000 mg total) by mouth 2 (two) times daily with a meal., Disp: , Rfl:  .  methotrexate 50 MG/2ML injection, Inject 25 mg into the vein every Thursday., Disp: , Rfl:  .  metoprolol tartrate (LOPRESSOR) 25 MG tablet, TAKE 1 TABLET TWICE DAILY, Disp: 180 tablet, Rfl: 3 .  nitroGLYCERIN (NITROSTAT) 0.4 MG SL tablet, Place 1 tablet (0.4 mg total) under the tongue every 5 (five) minutes x 3 doses as needed for chest pain., Disp: 25 tablet, Rfl: 2 .  oxyCODONE (OXY IR/ROXICODONE) 5 MG immediate release tablet, Take 1 tablet (5 mg total) by mouth every 4 (four)  hours as needed for moderate pain., Disp: 12 tablet, Rfl: 0 .  primidone (MYSOLINE) 50 MG tablet, Take 50 mg by mouth daily., Disp: , Rfl:  .  quinapril (ACCUPRIL) 10 MG tablet, Take 10 mg by mouth at bedtime. IN CONJUNCTION WITH ONE-HALF (20 MG) OF A 40 MG TABLET TO EQUAL A TOTAL OF 30 MILLIGRAMS, Disp: , Rfl:  .  quinapril (ACCUPRIL) 40 MG tablet, TAKE 1/2 TABLET AT BEDTIME, Disp: 45 tablet, Rfl: 3 .  silver sulfADIAZINE (SILVADENE) 1 % cream, Apply 1 application topically daily. Apply to open wounds on left leg daily. Cover with gauze. Wrap left leg snugly with ACE wrap daily., Disp: 50 g, Rfl: 0 .  Tocilizumab 162 MG/0.9ML SOSY, Inject 162 mg into the skin once a week. ACTEMRA, Disp: , Rfl:  .  cephALEXin (KEFLEX) 500 MG capsule, Take 1 capsule (500 mg total) by mouth every 6 (six) hours. (Patient not taking: Reported on 03/27/2017), Disp: 20 capsule, Rfl: 0  Vitals:   05/15/17 1042  BP: 125/75  Pulse: 82  Resp: 20  Temp: 97.8 F (36.6 C)  TempSrc: Oral  SpO2: 93%  Weight: 246 lb (111.6 kg)  Height: 6' (1.829 m)    Body mass index is 33.36 kg/m.         Review of Systems Denies chest pain, dyspnea on exertion, PND, orthopnea, hemoptysis, claudication    Objective:   Physical Exam BP 125/75 (BP Location: Left Arm, Patient Position: Sitting, Cuff Size: Normal)   Pulse 82   Temp 97.8 F (36.6 C) (Oral)   Resp 20   Ht 6' (1.829 m)   Wt 246 lb (111.6 kg)   SpO2 93%   BMI 33.36 kg/m   Gen. well-developed well-nourished male no apparent distress alert and oriented 3 Lungs no rhonchi or wheezing Cardiovascular regular rhythm no murmurs Left leg with mild discomfort to deep palpation over great saphenous vein in thigh and angle area. Chronic erythema lower third left leg with 1+ edema. No active ulcer noted. 3+ dorsalis pedis pulse palpable.  Today I ordered a venous duplex exam the left leg which I reviewed and interpreted. There is no DVT. There is total closure of the  left great saphenous vein from the calf to near the saphenofemoral junction     Assessment:     Successful laser ablation left great saphenous vein for gross reflux with history of recently healed venous stasis ulcer left leg and chronic edema    Plan:     #1 continue to wear short leg elastic compression stocking on a chronic basis Return  to see Dr. early on a when necessary basis

## 2017-05-28 DIAGNOSIS — Z6833 Body mass index (BMI) 33.0-33.9, adult: Secondary | ICD-10-CM | POA: Diagnosis not present

## 2017-05-28 DIAGNOSIS — E669 Obesity, unspecified: Secondary | ICD-10-CM | POA: Diagnosis not present

## 2017-05-28 DIAGNOSIS — E119 Type 2 diabetes mellitus without complications: Secondary | ICD-10-CM | POA: Diagnosis not present

## 2017-05-28 DIAGNOSIS — J449 Chronic obstructive pulmonary disease, unspecified: Secondary | ICD-10-CM | POA: Diagnosis not present

## 2017-05-28 DIAGNOSIS — Z1389 Encounter for screening for other disorder: Secondary | ICD-10-CM | POA: Diagnosis not present

## 2017-05-28 DIAGNOSIS — J329 Chronic sinusitis, unspecified: Secondary | ICD-10-CM | POA: Diagnosis not present

## 2017-06-06 ENCOUNTER — Encounter (HOSPITAL_COMMUNITY): Payer: Self-pay | Admitting: *Deleted

## 2017-06-06 ENCOUNTER — Emergency Department (HOSPITAL_COMMUNITY): Admit: 2017-06-06 | Payer: Medicare HMO | Admitting: Interventional Cardiology

## 2017-06-06 ENCOUNTER — Inpatient Hospital Stay (HOSPITAL_COMMUNITY)
Admission: EM | Disposition: A | Discharge status: Home / Self Care | Payer: Self-pay | Source: Home / Self Care | Attending: Interventional Cardiology

## 2017-06-06 ENCOUNTER — Inpatient Hospital Stay (HOSPITAL_COMMUNITY)
Admission: EM | Admit: 2017-06-06 | Discharge: 2017-06-11 | DRG: 250 | Disposition: A | Discharge status: Home / Self Care | Payer: Medicare HMO | Attending: Interventional Cardiology | Admitting: Interventional Cardiology

## 2017-06-06 DIAGNOSIS — J449 Chronic obstructive pulmonary disease, unspecified: Secondary | ICD-10-CM | POA: Diagnosis present

## 2017-06-06 DIAGNOSIS — I110 Hypertensive heart disease with heart failure: Secondary | ICD-10-CM | POA: Diagnosis not present

## 2017-06-06 DIAGNOSIS — I2121 ST elevation (STEMI) myocardial infarction involving left circumflex coronary artery: Secondary | ICD-10-CM | POA: Diagnosis not present

## 2017-06-06 DIAGNOSIS — Y838 Other surgical procedures as the cause of abnormal reaction of the patient, or of later complication, without mention of misadventure at the time of the procedure: Secondary | ICD-10-CM | POA: Diagnosis present

## 2017-06-06 DIAGNOSIS — I2119 ST elevation (STEMI) myocardial infarction involving other coronary artery of inferior wall: Secondary | ICD-10-CM | POA: Diagnosis not present

## 2017-06-06 DIAGNOSIS — Z972 Presence of dental prosthetic device (complete) (partial): Secondary | ICD-10-CM

## 2017-06-06 DIAGNOSIS — Z9049 Acquired absence of other specified parts of digestive tract: Secondary | ICD-10-CM

## 2017-06-06 DIAGNOSIS — F17210 Nicotine dependence, cigarettes, uncomplicated: Secondary | ICD-10-CM | POA: Diagnosis present

## 2017-06-06 DIAGNOSIS — I252 Old myocardial infarction: Secondary | ICD-10-CM | POA: Diagnosis not present

## 2017-06-06 DIAGNOSIS — I5042 Chronic combined systolic (congestive) and diastolic (congestive) heart failure: Secondary | ICD-10-CM | POA: Diagnosis present

## 2017-06-06 DIAGNOSIS — Z800 Family history of malignant neoplasm of digestive organs: Secondary | ICD-10-CM | POA: Diagnosis not present

## 2017-06-06 DIAGNOSIS — J329 Chronic sinusitis, unspecified: Secondary | ICD-10-CM | POA: Diagnosis present

## 2017-06-06 DIAGNOSIS — E1159 Type 2 diabetes mellitus with other circulatory complications: Secondary | ICD-10-CM | POA: Diagnosis not present

## 2017-06-06 DIAGNOSIS — Z91041 Radiographic dye allergy status: Secondary | ICD-10-CM | POA: Diagnosis not present

## 2017-06-06 DIAGNOSIS — E785 Hyperlipidemia, unspecified: Secondary | ICD-10-CM | POA: Diagnosis present

## 2017-06-06 DIAGNOSIS — Z888 Allergy status to other drugs, medicaments and biological substances status: Secondary | ICD-10-CM

## 2017-06-06 DIAGNOSIS — I2510 Atherosclerotic heart disease of native coronary artery without angina pectoris: Secondary | ICD-10-CM | POA: Diagnosis not present

## 2017-06-06 DIAGNOSIS — Z6833 Body mass index (BMI) 33.0-33.9, adult: Secondary | ICD-10-CM

## 2017-06-06 DIAGNOSIS — I25110 Atherosclerotic heart disease of native coronary artery with unstable angina pectoris: Secondary | ICD-10-CM | POA: Diagnosis not present

## 2017-06-06 DIAGNOSIS — Z8249 Family history of ischemic heart disease and other diseases of the circulatory system: Secondary | ICD-10-CM | POA: Diagnosis not present

## 2017-06-06 DIAGNOSIS — E1100 Type 2 diabetes mellitus with hyperosmolarity without nonketotic hyperglycemic-hyperosmolar coma (NKHHC): Secondary | ICD-10-CM | POA: Diagnosis not present

## 2017-06-06 DIAGNOSIS — I213 ST elevation (STEMI) myocardial infarction of unspecified site: Secondary | ICD-10-CM | POA: Diagnosis not present

## 2017-06-06 DIAGNOSIS — Z01810 Encounter for preprocedural cardiovascular examination: Secondary | ICD-10-CM | POA: Diagnosis not present

## 2017-06-06 DIAGNOSIS — I5043 Acute on chronic combined systolic (congestive) and diastolic (congestive) heart failure: Secondary | ICD-10-CM | POA: Diagnosis present

## 2017-06-06 DIAGNOSIS — Z955 Presence of coronary angioplasty implant and graft: Secondary | ICD-10-CM | POA: Diagnosis not present

## 2017-06-06 DIAGNOSIS — I255 Ischemic cardiomyopathy: Secondary | ICD-10-CM | POA: Diagnosis not present

## 2017-06-06 DIAGNOSIS — F419 Anxiety disorder, unspecified: Secondary | ICD-10-CM | POA: Diagnosis present

## 2017-06-06 DIAGNOSIS — T82897A Other specified complication of cardiac prosthetic devices, implants and grafts, initial encounter: Secondary | ICD-10-CM

## 2017-06-06 DIAGNOSIS — I739 Peripheral vascular disease, unspecified: Secondary | ICD-10-CM | POA: Diagnosis present

## 2017-06-06 DIAGNOSIS — Z7982 Long term (current) use of aspirin: Secondary | ICD-10-CM

## 2017-06-06 DIAGNOSIS — Z9861 Coronary angioplasty status: Secondary | ICD-10-CM

## 2017-06-06 DIAGNOSIS — Z87891 Personal history of nicotine dependence: Secondary | ICD-10-CM | POA: Diagnosis not present

## 2017-06-06 DIAGNOSIS — I2584 Coronary atherosclerosis due to calcified coronary lesion: Secondary | ICD-10-CM | POA: Diagnosis present

## 2017-06-06 DIAGNOSIS — Z7984 Long term (current) use of oral hypoglycemic drugs: Secondary | ICD-10-CM

## 2017-06-06 DIAGNOSIS — E669 Obesity, unspecified: Secondary | ICD-10-CM | POA: Diagnosis present

## 2017-06-06 DIAGNOSIS — R0789 Other chest pain: Secondary | ICD-10-CM | POA: Diagnosis not present

## 2017-06-06 DIAGNOSIS — K219 Gastro-esophageal reflux disease without esophagitis: Secondary | ICD-10-CM | POA: Diagnosis present

## 2017-06-06 DIAGNOSIS — M069 Rheumatoid arthritis, unspecified: Secondary | ICD-10-CM | POA: Diagnosis not present

## 2017-06-06 DIAGNOSIS — E7800 Pure hypercholesterolemia, unspecified: Secondary | ICD-10-CM | POA: Diagnosis not present

## 2017-06-06 DIAGNOSIS — E1165 Type 2 diabetes mellitus with hyperglycemia: Secondary | ICD-10-CM | POA: Diagnosis present

## 2017-06-06 DIAGNOSIS — T82855A Stenosis of coronary artery stent, initial encounter: Secondary | ICD-10-CM | POA: Diagnosis not present

## 2017-06-06 DIAGNOSIS — Z794 Long term (current) use of insulin: Secondary | ICD-10-CM

## 2017-06-06 DIAGNOSIS — I10 Essential (primary) hypertension: Secondary | ICD-10-CM | POA: Diagnosis present

## 2017-06-06 DIAGNOSIS — Z720 Tobacco use: Secondary | ICD-10-CM | POA: Diagnosis present

## 2017-06-06 DIAGNOSIS — E1151 Type 2 diabetes mellitus with diabetic peripheral angiopathy without gangrene: Secondary | ICD-10-CM

## 2017-06-06 DIAGNOSIS — I83892 Varicose veins of left lower extremities with other complications: Secondary | ICD-10-CM | POA: Diagnosis present

## 2017-06-06 DIAGNOSIS — D696 Thrombocytopenia, unspecified: Secondary | ICD-10-CM | POA: Diagnosis not present

## 2017-06-06 DIAGNOSIS — Z79891 Long term (current) use of opiate analgesic: Secondary | ICD-10-CM

## 2017-06-06 DIAGNOSIS — Z79899 Other long term (current) drug therapy: Secondary | ICD-10-CM

## 2017-06-06 DIAGNOSIS — I5030 Unspecified diastolic (congestive) heart failure: Secondary | ICD-10-CM | POA: Diagnosis not present

## 2017-06-06 HISTORY — PX: CORONARY STENT INTERVENTION: CATH118234

## 2017-06-06 HISTORY — PX: LEFT HEART CATH AND CORONARY ANGIOGRAPHY: CATH118249

## 2017-06-06 SURGERY — LEFT HEART CATH AND CORONARY ANGIOGRAPHY
Anesthesia: LOCAL

## 2017-06-06 MED ORDER — SODIUM CHLORIDE 0.9% FLUSH: 3.0000 mL | INTRAVENOUS | Status: DC | PRN

## 2017-06-06 MED ORDER — HEPARIN SODIUM (PORCINE) 5000 UNIT/ML IJ SOLN: 5000.0000 [IU] | Freq: Three times a day (TID) | INTRAMUSCULAR | Status: DC

## 2017-06-06 MED ORDER — NITROGLYCERIN IN D5W 200-5 MCG/ML-% IV SOLN
INTRAVENOUS | Status: AC | PRN
  Administered 2017-06-06: 10 ug/min via INTRAVENOUS

## 2017-06-06 MED ORDER — OXYCODONE HCL 5 MG PO TABS
5.0000 mg | ORAL_TABLET | ORAL | Status: DC | PRN
  Administered 2017-06-10: 5 mg via ORAL
  Filled 2017-06-06: qty 1

## 2017-06-06 MED ORDER — IOPAMIDOL (ISOVUE-370) INJECTION 76%
INTRAVENOUS | Status: DC | PRN
  Administered 2017-06-06: 130 mL via INTRAVENOUS

## 2017-06-06 MED ORDER — SODIUM CHLORIDE 0.9 % IV SOLN
INTRAVENOUS | Status: AC
  Administered 2017-06-07: via INTRAVENOUS

## 2017-06-06 MED ORDER — NITROGLYCERIN IN D5W 200-5 MCG/ML-% IV SOLN
INTRAVENOUS | Status: AC
  Filled 2017-06-06: qty 250

## 2017-06-06 MED ORDER — METOPROLOL TARTRATE 25 MG PO TABS
25.0000 mg | ORAL_TABLET | Freq: Two times a day (BID) | ORAL | Status: DC
  Administered 2017-06-07 (×2): 25 mg via ORAL
  Filled 2017-06-06 (×2): qty 1

## 2017-06-06 MED ORDER — HEPARIN SODIUM (PORCINE) 1000 UNIT/ML IJ SOLN
INTRAMUSCULAR | Status: DC | PRN
  Administered 2017-06-06: 8000 [IU] via INTRAVENOUS
  Administered 2017-06-06: 2000 [IU] via INTRAVENOUS

## 2017-06-06 MED ORDER — IOPAMIDOL (ISOVUE-370) INJECTION 76%
INTRAVENOUS | Status: AC
Start: 2017-06-06 — End: 2017-06-06
  Filled 2017-06-06: qty 100

## 2017-06-06 MED ORDER — ASPIRIN EC 81 MG PO TBEC
81.0000 mg | DELAYED_RELEASE_TABLET | Freq: Every day | ORAL | Status: DC
  Administered 2017-06-07 – 2017-06-11 (×5): 81 mg via ORAL
  Filled 2017-06-06 (×5): qty 1

## 2017-06-06 MED ORDER — ACETAMINOPHEN 325 MG PO TABS: 650.0000 mg | ORAL_TABLET | ORAL | Status: DC | PRN

## 2017-06-06 MED ORDER — METHYLPREDNISOLONE SODIUM SUCC 125 MG IJ SOLR
100.0000 mg | Freq: Once | INTRAMUSCULAR | Status: AC
  Administered 2017-06-06: 100 mg via INTRAVENOUS

## 2017-06-06 MED ORDER — VERAPAMIL HCL 2.5 MG/ML IV SOLN
INTRAVENOUS | Status: AC
  Filled 2017-06-06: qty 2

## 2017-06-06 MED ORDER — HEPARIN SODIUM (PORCINE) 1000 UNIT/ML IJ SOLN
INTRAMUSCULAR | Status: AC
  Filled 2017-06-06: qty 1

## 2017-06-06 MED ORDER — ATORVASTATIN CALCIUM 80 MG PO TABS
80.0000 mg | ORAL_TABLET | Freq: Every day | ORAL | Status: DC
  Administered 2017-06-07 – 2017-06-10 (×4): 80 mg via ORAL
  Filled 2017-06-06 (×4): qty 1

## 2017-06-06 MED ORDER — LIDOCAINE HCL (PF) 1 % IJ SOLN
INTRAMUSCULAR | Status: DC | PRN
  Administered 2017-06-06: 2 mL

## 2017-06-06 MED ORDER — HEPARIN SODIUM (PORCINE) 5000 UNIT/ML IJ SOLN
4000.0000 [IU] | Freq: Once | INTRAMUSCULAR | Status: AC
  Administered 2017-06-06: 4000 [IU] via INTRAVENOUS

## 2017-06-06 MED ORDER — TIROFIBAN HCL IN NACL 5-0.9 MG/100ML-% IV SOLN
INTRAVENOUS | Status: AC
Start: 2017-06-06 — End: 2017-06-06
  Filled 2017-06-06: qty 100

## 2017-06-06 MED ORDER — PRIMIDONE 50 MG PO TABS
50.0000 mg | ORAL_TABLET | Freq: Every day | ORAL | Status: DC
  Administered 2017-06-07 – 2017-06-11 (×5): 50 mg via ORAL
  Filled 2017-06-06 (×5): qty 1

## 2017-06-06 MED ORDER — FUROSEMIDE 10 MG/ML IJ SOLN
INTRAMUSCULAR | Status: DC | PRN
  Administered 2017-06-06: 40 mg via INTRAVENOUS

## 2017-06-06 MED ORDER — HEPARIN (PORCINE) IN NACL 2-0.9 UNIT/ML-% IJ SOLN
INTRAMUSCULAR | Status: AC
Start: 2017-06-06 — End: 2017-06-06
  Filled 2017-06-06: qty 1000

## 2017-06-06 MED ORDER — FENTANYL CITRATE (PF) 100 MCG/2ML IJ SOLN
INTRAMUSCULAR | Status: AC
  Filled 2017-06-06: qty 2

## 2017-06-06 MED ORDER — NITROGLYCERIN 1 MG/10 ML FOR IR/CATH LAB
INTRA_ARTERIAL | Status: AC
  Filled 2017-06-06: qty 10

## 2017-06-06 MED ORDER — OXYCODONE HCL 5 MG PO TABS: 5.0000 mg | ORAL_TABLET | ORAL | Status: DC | PRN

## 2017-06-06 MED ORDER — FENTANYL CITRATE (PF) 100 MCG/2ML IJ SOLN
INTRAMUSCULAR | Status: DC | PRN
  Administered 2017-06-06 (×2): 50 ug via INTRAVENOUS

## 2017-06-06 MED ORDER — MIDAZOLAM HCL 2 MG/2ML IJ SOLN
INTRAMUSCULAR | Status: DC | PRN
  Administered 2017-06-06: 2 mg via INTRAVENOUS

## 2017-06-06 MED ORDER — TIROFIBAN (AGGRASTAT) BOLUS VIA INFUSION
INTRAVENOUS | Status: DC | PRN
  Administered 2017-06-06: 2775 ug via INTRAVENOUS

## 2017-06-06 MED ORDER — MIDAZOLAM HCL 2 MG/2ML IJ SOLN
INTRAMUSCULAR | Status: AC
Start: 2017-06-06 — End: 2017-06-06
  Filled 2017-06-06: qty 2

## 2017-06-06 MED ORDER — SODIUM CHLORIDE 0.9 % IV SOLN: 250.0000 mL | INTRAVENOUS | Status: DC | PRN

## 2017-06-06 MED ORDER — HEPARIN (PORCINE) IN NACL 2-0.9 UNIT/ML-% IJ SOLN
INTRAMUSCULAR | Status: AC | PRN
  Administered 2017-06-06: 1000 mL

## 2017-06-06 MED ORDER — HEPARIN (PORCINE) IN NACL 100-0.45 UNIT/ML-% IJ SOLN
1400.0000 [IU]/h | INTRAMUSCULAR | Status: DC
  Filled 2017-06-06: qty 250

## 2017-06-06 MED ORDER — NITROGLYCERIN 0.4 MG SL SUBL: 0.4000 mg | SUBLINGUAL_TABLET | SUBLINGUAL | Status: DC | PRN

## 2017-06-06 MED ORDER — INSULIN GLARGINE 100 UNIT/ML ~~LOC~~ SOLN
80.0000 [IU] | Freq: Every day | SUBCUTANEOUS | Status: DC
  Administered 2017-06-07 (×2): 80 [IU] via SUBCUTANEOUS
  Filled 2017-06-06 (×3): qty 0.8

## 2017-06-06 MED ORDER — ASPIRIN 81 MG PO CHEW: 81.0000 mg | CHEWABLE_TABLET | Freq: Every day | ORAL | Status: DC

## 2017-06-06 MED ORDER — ONDANSETRON HCL 4 MG/2ML IJ SOLN: 4.0000 mg | Freq: Four times a day (QID) | INTRAMUSCULAR | Status: DC | PRN

## 2017-06-06 MED ORDER — VERAPAMIL HCL 2.5 MG/ML IV SOLN
INTRAVENOUS | Status: DC | PRN
  Administered 2017-06-06: 23:00:00 via INTRA_ARTERIAL

## 2017-06-06 MED ORDER — LIDOCAINE HCL 2 % IJ SOLN
INTRAMUSCULAR | Status: AC
Start: 2017-06-06 — End: 2017-06-06
  Filled 2017-06-06: qty 10

## 2017-06-06 MED ORDER — SODIUM CHLORIDE 0.9% FLUSH
3.0000 mL | Freq: Two times a day (BID) | INTRAVENOUS | Status: DC
  Administered 2017-06-08 – 2017-06-11 (×5): 3 mL via INTRAVENOUS

## 2017-06-06 MED ORDER — TIROFIBAN HCL IN NACL 5-0.9 MG/100ML-% IV SOLN
INTRAVENOUS | Status: AC | PRN
  Administered 2017-06-06: 0.15 ug/kg/min via INTRAVENOUS

## 2017-06-06 MED ORDER — FUROSEMIDE 10 MG/ML IJ SOLN
INTRAMUSCULAR | Status: AC
  Filled 2017-06-06: qty 4

## 2017-06-06 MED ORDER — ALPRAZOLAM 0.5 MG PO TABS
0.5000 mg | ORAL_TABLET | Freq: Three times a day (TID) | ORAL | Status: DC | PRN
  Administered 2017-06-09: 0.5 mg via ORAL
  Filled 2017-06-06: qty 1

## 2017-06-06 SURGICAL SUPPLY — 21 items
BALLN EMERGE MR 2.5X20 (BALLOONS) ×2
BALLN WOLVERINE 3.50X15 (BALLOONS) ×2
BALLN ~~LOC~~ EMERGE MR 3.5X20 (BALLOONS) ×2
BALLOON EMERGE MR 2.5X20 (BALLOONS) IMPLANT
BALLOON WOLVERINE 3.50X15 (BALLOONS) IMPLANT
BALLOON ~~LOC~~ EMERGE MR 3.5X20 (BALLOONS) IMPLANT
CATH INFINITI JR4 5F (CATHETERS) ×1 IMPLANT
CATH LAUNCHER 5F EBU3.0 (CATHETERS) IMPLANT
CATH VISTA GUIDE 6FR XB3.5 (CATHETERS) ×1 IMPLANT
CATHETER LAUNCHER 5F EBU3.0 (CATHETERS) ×2
DEVICE RAD COMP TR BAND LRG (VASCULAR PRODUCTS) ×1 IMPLANT
GLIDESHEATH SLEND A-KIT 6F 22G (SHEATH) ×1 IMPLANT
GUIDEWIRE INQWIRE 1.5J.035X260 (WIRE) IMPLANT
INQWIRE 1.5J .035X260CM (WIRE) ×2
KIT ENCORE 26 ADVANTAGE (KITS) ×1 IMPLANT
KIT HEART LEFT (KITS) ×2 IMPLANT
PACK CARDIAC CATHETERIZATION (CUSTOM PROCEDURE TRAY) ×2 IMPLANT
TRANSDUCER W/STOPCOCK (MISCELLANEOUS) ×2 IMPLANT
TUBING CIL FLEX 10 FLL-RA (TUBING) ×2 IMPLANT
WIRE ASAHI PROWATER 180CM (WIRE) ×1 IMPLANT
WIRE ASAHI PROWATER 300CM (WIRE) IMPLANT

## 2017-06-06 NOTE — ED Provider Notes (Signed)
[facility] DEPT Provider Note   CSN: [id number] Arrival date & time: 06/06/17  2214     History   Chief Complaint Chief Complaint  Patient presents with  . Code STEMI    HPI Robert [name] is a 66 y.o. male.  66 year old male history of CAD S/P stents, ischemic cardiomyopathy, HTN, HLD who presents via EMS for chest pain and found to have inferior STEMI.  Patient states onset of diffuse sharp chest pain 1 hour PTA with bilateral arm heaviness. Wife called EMS. EKG concerning for inferior STEMI. PTA, he received 4x81mg  ASA, 4x nitroglycerin, and morphine 10mg . States chest pain is slightly improved. Cardiology at bedside on arrival.  The history is provided by the patient, medical records and the EMS personnel. No language interpreter was used.    Past Medical History:  Diagnosis Date  . Anxiety   . COPD (chronic obstructive pulmonary disease) ([place])   . Coronary artery disease   . Full dentures   . GERD (gastroesophageal reflux disease)    Rolaids as needed  . High cholesterol   . History of MI (myocardial infarction)   . Hypertension    med. dosage increased 04/2016; has been on BP med. x 10 yrs.  . Incarcerated umbilical hernia 93/2671  . Inguinal hernia 05/2016   bilateral   . Insulin dependent diabetes mellitus ([place])   . Ischemic cardiomyopathy    a. 03/2015 EF 40-45% by LV gram.  . Left leg cellulitis 10/08/2016  . Rheumatoid arthritis(714.0)     Patient Active Problem List   Diagnosis Date Noted  . Acute ST elevation myocardial infarction (STEMI) of inferior wall ([place]) 06/06/2017  . Varicose veins of left lower extremity with complications 24/58/0998  . Cellulitis of left leg without foot   . Right radial fracture 12/11/2016  . T3 vertebral fracture ([place]) 12/11/2016  . Fall 12/09/2016  . Thrombocytopenia ([place]) 10/09/2016  . Left leg cellulitis 10/08/2016  . Diabetes ([place]) 10/08/2016  . Chronic combined systolic and diastolic CHF (congestive heart failure)  ([place]) 04/25/2016  . High cholesterol   . Type II diabetes mellitus ([place])   . Essential hypertension   . Non-STEMI (non-ST elevated myocardial infarction) ([place])   . CAD S/P percutaneous coronary angioplasty   . Unstable angina ([place]) 03/20/2015  . ICM-EF 35% at cath 02/01/15 02/02/2015  . CAD (coronary artery disease) 01/09/2015  . DM type 2 causing vascular disease, not at goal [facility]) 05/29/2014  . Dyspnea 10/20/2013  . COPD (chronic obstructive pulmonary disease) ([place]) 09/03/2013  . Hyperlipidemia   . Old myocardial infarction 09/01/2013  . Abnormal nuclear stress test 05/17/2012  . S/P CFX PTCAI for ISR 02/01/15 05/17/2012  . PVD, chronic LLE 12/22/2011  . Rheumatoid arthritis ([place]) 12/22/2011  . Contrast media allergy 12/22/2011  . Tobacco abuse 12/21/2011    Past Surgical History:  Procedure Laterality Date  . CARDIAC CATHETERIZATION N/A 02/01/2015   Procedure: Left Heart Cath and Coronary Angiography;  Surgeon: Belva Crome, MD;  Location: [place] CV LAB;  Service: Cardiovascular;  Laterality: N/A;  . CARDIAC CATHETERIZATION N/A 03/22/2015   Procedure: Left Heart Cath and Coronary Angiography;  Surgeon: Leonie Man, MD;  Location: [place] CV LAB;  Service: Cardiovascular;  Laterality: N/A;  . CARDIAC CATHETERIZATION  09/05/2002; 03/09/2003; 04/10/2005;06/02/2005; 05/09/2006; 02/09/2007; 03/01/2007  . CORONARY ANGIOPLASTY  11/14/2012; 02/01/2015  . CORONARY ANGIOPLASTY WITH STENT PLACEMENT  05/16/2012   "1; makes total ~ 4"  . INSERTION OF MESH Bilateral 05/24/2016  Procedure: INSERTION OF MESH;  Surgeon: Coralie Keens, MD;  Location: [facility];  Service: General;  Laterality: Bilateral;  . LAPAROSCOPIC CHOLECYSTECTOMY    . LAPAROSCOPIC INGUINAL HERNIA WITH UMBILICAL HERNIA Bilateral 05/24/2016   Procedure: BILATERAL LAPAROSCOPIC INGUINAL HERNIA REPAIR WITH MESH AND UMBILICAL HERNIA REPAIR WITH MESH;  Surgeon: Coralie Keens, MD;  Location: [facility];  Service: General;  Laterality: Bilateral;  . LEFT HEART CATHETERIZATION WITH CORONARY ANGIOGRAM N/A 12/22/2011   Procedure: LEFT HEART CATHETERIZATION WITH CORONARY ANGIOGRAM;  Surgeon: Troy Sine, MD;  Location: [facility] CATH LAB;  Service: Cardiovascular;  Laterality: N/A;  . LEFT HEART CATHETERIZATION WITH CORONARY ANGIOGRAM Bilateral 05/16/2012   Procedure: LEFT HEART CATHETERIZATION WITH CORONARY ANGIOGRAM;  Surgeon: Lorretta Harp, MD;  Location: [facility] CATH LAB;  Service: Cardiovascular;  Laterality: Bilateral;  . LEFT HEART CATHETERIZATION WITH CORONARY ANGIOGRAM N/A 11/14/2012   Procedure: LEFT HEART CATHETERIZATION WITH CORONARY ANGIOGRAM;  Surgeon: Troy Sine, MD;  Location: [facility] CATH LAB;  Service: Cardiovascular;  Laterality: N/A;  . LEFT HEART CATHETERIZATION WITH CORONARY ANGIOGRAM N/A 09/01/2013   Procedure: LEFT HEART CATHETERIZATION WITH CORONARY ANGIOGRAM;  Surgeon: Leonie Man, MD;  Location: [facility] CATH LAB;  Service: Cardiovascular;  Laterality: N/A;  . [place]; 1973; 1985   x 3  . NM MYOCAR PERF WALL MOTION  08/30/2009   No significant ischemia  . OPEN REDUCTION INTERNAL FIXATION (ORIF) DISTAL RADIAL FRACTURE Right 12/10/2016   Procedure: OPEN REDUCTION INTERNAL FIXATION (ORIF) DISTAL RADIAL FRACTURE;  Surgeon: Iran Planas, MD;  Location: [place];  Service: Orthopedics;  Laterality: Right;  . PERCUTANEOUS CORONARY INTERVENTION-BALLOON ONLY  12/22/2011   Procedure: PERCUTANEOUS CORONARY INTERVENTION-BALLOON ONLY;  Surgeon: Troy Sine, MD;  Location: [facility] CATH LAB;  Service: Cardiovascular;;  . PERCUTANEOUS CORONARY INTERVENTION-BALLOON ONLY  11/14/2012   Procedure: PERCUTANEOUS CORONARY INTERVENTION-BALLOON ONLY;  Surgeon: Troy Sine, MD;  Location: [facility] CATH LAB;  Service: Cardiovascular;;  . PERCUTANEOUS CORONARY STENT INTERVENTION (PCI-S)  05/16/2012   Procedure: PERCUTANEOUS CORONARY STENT INTERVENTION (PCI-S);  Surgeon: Lorretta Harp, MD;  Location: [facility]  CATH LAB;  Service: Cardiovascular;;  . PERCUTANEOUS CORONARY STENT INTERVENTION (PCI-S)  09/01/2013   Procedure: PERCUTANEOUS CORONARY STENT INTERVENTION (PCI-S);  Surgeon: Leonie Man, MD;  Location: [facility] CATH LAB;  Service: Cardiovascular;;       Home Medications    Prior to Admission medications   Medication Sig Start Date End Date Taking? Authorizing Provider  acarbose (PRECOSE) 50 MG tablet  03/08/17   [provider]  acetaminophen (TYLENOL) 650 MG CR tablet Take 650 mg by mouth every 8 (eight) hours as needed for pain.    [provider]  ALPRAZolam Duanne Moron) 0.5 MG tablet Take 0.5 mg by mouth 3 (three) times daily as needed for anxiety.     [provider]  AMBULATORY NON FORMULARY MEDICATION Take 600 mg by mouth daily at 12 noon. Medication Name: Dalcetrapib vs placebo Patient taking differently: Take 600 mg by mouth every evening. Medication Name: Dalcetrapib vs placebo: Cholesterol 12/15/16   Jettie Booze, MD  aspirin EC 81 MG EC tablet Take 1 tablet (81 mg total) by mouth daily. 11/15/12   Brett Canales, PA-C  atorvastatin (LIPITOR) 80 MG tablet TAKE 1 TABLET DAILY AT 6 PM. 04/04/17   Croitoru, Dani Gobble, MD  cephALEXin (KEFLEX) 500 MG capsule Take 1 capsule (500 mg total) by mouth every 6 (six) hours. Patient not taking: Reported on 03/27/2017 03/05/17  Orson Eva, MD  doxycycline (VIBRA-TABS) 100 MG tablet  02/14/17   [provider]  fish oil-omega-3 fatty acids 1000 MG capsule Take 1 g by mouth daily.     [provider]  folic acid (FOLVITE) 1 MG tablet Take 1 mg by mouth daily.     [provider]  furosemide (LASIX) 20 MG tablet Take 20 mg by mouth daily.  02/01/17   [provider]  gabapentin (NEURONTIN) 300 MG capsule  03/10/17   [provider]  glipiZIDE (GLUCOTROL) 10 MG tablet Take 10 mg by mouth 2 (two) times daily. 02/12/17   [provider]  Insulin Glargine (LANTUS SOLOSTAR) 100 UNIT/ML  Solostar Pen Inject 80 Units into the skin daily at 10 pm.    [provider]  isosorbide mononitrate (IMDUR) 120 MG 24 hr tablet TAKE 1 TABLET (120 MG TOTAL) BY MOUTH DAILY. 04/17/17   Croitoru, Mihai, MD  magnesium oxide (MAG-OX) 400 MG tablet Take 400 mg by mouth 2 (two) times daily.    [provider]  metFORMIN (GLUCOPHAGE) 1000 MG tablet Take 1 tablet (1,000 mg total) by mouth 2 (two) times daily with a meal. 03/23/15   Bhagat, McCarr, PA  methotrexate 50 MG/2ML injection Inject 25 mg into the vein every Thursday.    [provider]  metoprolol tartrate (LOPRESSOR) 25 MG tablet TAKE 1 TABLET TWICE DAILY 02/20/17   Croitoru, Dani Gobble, MD  nitroGLYCERIN (NITROSTAT) 0.4 MG SL tablet Place 1 tablet (0.4 mg total) under the tongue every 5 (five) minutes x 3 doses as needed for chest pain. 04/24/16   Croitoru, Mihai, MD  oxyCODONE (OXY IR/ROXICODONE) 5 MG immediate release tablet Take 1 tablet (5 mg total) by mouth every 4 (four) hours as needed for moderate pain. 03/05/17   Orson Eva, MD  primidone (MYSOLINE) 50 MG tablet Take 50 mg by mouth daily.    [provider]  quinapril (ACCUPRIL) 10 MG tablet Take 10 mg by mouth at bedtime. IN CONJUNCTION WITH ONE-HALF (20 MG) OF A 40 MG TABLET TO EQUAL A TOTAL OF 30 MILLIGRAMS    [provider]  quinapril (ACCUPRIL) 40 MG tablet TAKE 1/2 TABLET AT BEDTIME 04/26/17   Croitoru, Mihai, MD  silver sulfADIAZINE (SILVADENE) 1 % cream Apply 1 application topically daily. Apply to open wounds on left leg daily. Cover with gauze. Wrap left leg snugly with ACE wrap daily. 03/27/17   Alvia Grove, PA-C  Tocilizumab 162 MG/0.9ML SOSY Inject 162 mg into the skin once a week. ACTEMRA    [provider]    Family History Family History  Problem Relation Age of Onset  . Colon cancer Father   . Hypertension Mother   . Heart disease Mother   . Cancer Mother     Social History Social History  Substance Use  Topics  . Smoking status: Current Every Day Smoker    Packs/day: 0.50    Last attempt to quit: 06/11/2015  . Smokeless tobacco: Never Used  . Alcohol use No     Allergies   Ivp dye [iodinated diagnostic agents] and Plavix [clopidogrel bisulfate]   Review of Systems Review of Systems  Constitutional: Negative for chills and fever.  HENT: Negative for ear pain and sore throat.   Eyes: Negative for pain and visual disturbance.  Respiratory: Positive for shortness of breath. Negative for cough.   Cardiovascular: Positive for chest pain. Negative for palpitations.  Gastrointestinal: Negative for abdominal pain and vomiting.  Genitourinary: Negative for dysuria and hematuria.  Musculoskeletal: Negative for arthralgias and back pain.  Skin: Negative for color change and rash.  Neurological: Negative for seizures and syncope.  All other systems reviewed and are negative.    Physical Exam Updated Vital Signs BP (!) 167/84   Pulse (!) 118   Temp (!) 96.5 F (35.8 C) (Axillary)   Resp (!) 25   Ht 6' (1.829 m)   Wt 110.5 kg (243 lb 9.7 oz)   SpO2 94%   BMI 33.04 kg/m   Physical Exam  Constitutional: He appears well-developed. He appears ill.  HENT:  Head: Normocephalic and atraumatic.  Eyes: Conjunctivae are normal.  Neck: Neck supple.  Cardiovascular: Regular rhythm.  Tachycardia present.   No murmur heard. Pulmonary/Chest: Effort normal and breath sounds normal. No respiratory distress.  Abdominal: Soft. There is no tenderness.  Musculoskeletal: He exhibits no edema.  Neurological: He is alert. No cranial nerve deficit. Coordination normal.  5/5 motor strength and intact sensation in all extremities. Intact bilateral finger-to-nose coordination  Skin: Skin is warm and dry.  Nursing note and vitals reviewed.    ED Treatments / Results  Labs (all labs ordered are listed, but only abnormal results are displayed) Labs Reviewed  COMPREHENSIVE METABOLIC PANEL -  Abnormal; Notable for the following:       Result Value   Sodium 134 (*)    Chloride 98 (*)    CO2 21 (*)    Glucose, Bld 287 (*)    BUN 21 (*)    AST 75 (*)    All other components within normal limits  LIPID PANEL - Abnormal; Notable for the following:    HDL 29 (*)    All other components within normal limits  HEMOGLOBIN A1C - Abnormal; Notable for the following:    Hgb A1c MFr Bld 9.1 (*)    All other components within normal limits  CBC - Abnormal; Notable for the following:    WBC 13.0 (*)    RBC 5.83 (*)    Hemoglobin 19.1 (*)    HCT 54.3 (*)    Platelets 109 (*)    All other components within normal limits  TROPONIN I - Abnormal; Notable for the following:    Troponin I 6.42 (*)    All other components within normal limits  GLUCOSE, CAPILLARY - Abnormal; Notable for the following:    Glucose-Capillary 313 (*)    All other components within normal limits  SURGICAL PCR SCREEN  CBC  BASIC METABOLIC PANEL    EKG  EKG Interpretation None       Radiology No results found.  Procedures Procedures (including critical care time)  Medications Ordered in ED Medications  oxyCODONE (Oxy IR/ROXICODONE) immediate release tablet 5 mg (not administered)  metoprolol tartrate (LOPRESSOR) tablet 25 mg (25 mg Oral Given 06/07/17 0105)  insulin glargine (LANTUS) injection 80 Units (80 Units Subcutaneous Given 06/07/17 0106)  nitroGLYCERIN (NITROSTAT) SL tablet 0.4 mg (not administered)  primidone (MYSOLINE) tablet 50 mg (not administered)  aspirin EC tablet 81 mg (not administered)  ALPRAZolam (XANAX) tablet 0.5 mg (not administered)  atorvastatin (LIPITOR) tablet 80 mg (not administered)  acetaminophen (TYLENOL) tablet 650 mg (not administered)  ondansetron (ZOFRAN) injection 4 mg (not administered)  heparin injection 5,000 Units (not administered)  0.9 %  sodium chloride infusion ( Intravenous New Bag/Given 06/07/17 0029)  sodium chloride flush (NS) 0.9 % injection 3 mL  (not administered)  sodium chloride flush (NS) 0.9 %  injection 3 mL (not administered)  0.9 %  sodium chloride infusion (not administered)  tirofiban (AGGRASTAT) infusion 50 mcg/mL 100 mL (0.15 mcg/kg/min  111 kg Intravenous New Bag/Given 06/07/17 0017)  HYDROcodone-acetaminophen (NORCO/VICODIN) 5-325 MG per tablet 1 tablet (not administered)  heparin injection 4,000 Units (4,000 Units Intravenous Given 06/06/17 2223)  methylPREDNISolone sodium succinate (SOLU-MEDROL) 125 mg/2 mL injection 100 mg (100 mg Intravenous Given 06/06/17 2224)  tirofiban (AGGRASTAT) infusion 50 mcg/mL 100 mL (0.15 mcg/kg/min  111 kg Intravenous New Bag/Given 06/06/17 2257)  nitroGLYCERIN 50 mg in dextrose 5 % 250 mL (0.2 mg/mL) infusion (10 mcg/min Intravenous New Bag/Given 06/06/17 2316)  heparin infusion 2 units/mL in 0.9 % sodium chloride (1,000 mLs Other New Bag/Given 06/06/17 2320)     Initial Impression / Assessment and Plan / ED Course  I have reviewed the triage vital signs and the nursing notes.  Pertinent labs & imaging results that were available during my care of the patient were reviewed by me and considered in my medical decision making (see chart for details).     61 yoM h/o CAD s/p stents who presents via EMS for chest pain and EKG concerning for inferior STEMI. ASA 4x81mg  given PTA. Cardiology at bedside. Chest pain improved after morphine. Repeat EKG showing continued inferior STEMI.  Concern for re-occlusion of previous cardiac stents. Pt taken emergently to cath lab for PCI. Pt stable at time of transfer.  Pt care d/w Dr. Thomasene Lot  Final Clinical Impressions(s) / ED Diagnoses   Final diagnoses:  Acute ST elevation myocardial infarction (STEMI) of inferior wall [facility])  Coronary stent occlusion, initial encounter    New Prescriptions Current Discharge Medication List       Payton Emerald, MD 06/07/17 0157    Macarthur Critchley, MD 06/07/17 1549

## 2017-06-06 NOTE — ED Triage Notes (Signed)
Pt arrives to [facility] ED via [place] for activated code STEMI Pt reports CP x 1 hr PTA with hx of 5 previous MIs Pt states he took 81mg  ASA x 3 and 0.4mg  SL NTG x 1 prior to calling 911 EMS states they gave an additional 81mg  ASA for total 324mg  and 3 additional SL NTG for a total of 0.16mg  NTG, also given 10mg  of morphine (5mg  doses x 2); pts pain went from 9/10 to 6/10; pt states pain feels same as prior MIs EMS vital signs : 160/115, 97% on 2L O2 via [place] Bil[place] 16g PIV in the [facility]

## 2017-06-06 NOTE — ED Notes (Signed)
Pt ready cath lab ready

## 2017-06-06 NOTE — H&P (Signed)
Patient ID: Robert [name] MRN: [id number], DOB/AGE: Apr 05, 1951   Admit date: 06/06/2017   Primary Physician: Redmond School, MD Primary Cardiologist: Croitoru  Pt. Profile: 66 y.o. male with a PMH of CAD (multiple PCIs, w/ in-stent restenosis, last PCI in 01/2015), DMII, RA and COPD who presents with inferiorposterior STEMI.  Problem List  Past Medical History:  Diagnosis Date  . Anxiety   . COPD (chronic obstructive pulmonary disease) ([place])   . Coronary artery disease   . Full dentures   . GERD (gastroesophageal reflux disease)    Rolaids as needed  . High cholesterol   . History of MI (myocardial infarction)   . Hypertension    med. dosage increased 04/2016; has been on BP med. x 10 yrs.  . Incarcerated umbilical hernia 40/9811  . Inguinal hernia 05/2016   bilateral   . Insulin dependent diabetes mellitus ([place])   . Ischemic cardiomyopathy    a. 03/2015 EF 40-45% by LV gram.  . Left leg cellulitis 10/08/2016  . Rheumatoid arthritis(714.0)     Past Surgical History:  Procedure Laterality Date  . CARDIAC CATHETERIZATION N/A 02/01/2015   Procedure: Left Heart Cath and Coronary Angiography;  Surgeon: Belva Crome, MD;  Location: [place] CV LAB;  Service: Cardiovascular;  Laterality: N/A;  . CARDIAC CATHETERIZATION N/A 03/22/2015   Procedure: Left Heart Cath and Coronary Angiography;  Surgeon: Leonie Man, MD;  Location: [place] [facility];  Service: Cardiovascular;  Laterality: N/A;  . CARDIAC CATHETERIZATION  09/05/2002; 03/09/2003; 04/10/2005;06/02/2005; 05/09/2006; 02/09/2007; 03/01/2007  . CORONARY ANGIOPLASTY  11/14/2012; 02/01/2015  . CORONARY ANGIOPLASTY WITH STENT PLACEMENT  05/16/2012   "1; makes total ~ 4"  . INSERTION OF MESH Bilateral 05/24/2016   Procedure: INSERTION OF MESH;  Surgeon: Coralie Keens, MD;  Location: [facility];  Service: General;  Laterality: Bilateral;  . LAPAROSCOPIC CHOLECYSTECTOMY    . LAPAROSCOPIC INGUINAL HERNIA WITH  UMBILICAL HERNIA Bilateral 05/24/2016   Procedure: BILATERAL LAPAROSCOPIC INGUINAL HERNIA REPAIR WITH MESH AND UMBILICAL HERNIA REPAIR WITH MESH;  Surgeon: Coralie Keens, MD;  Location: [place];  Service: General;  Laterality: Bilateral;  . LEFT HEART CATHETERIZATION WITH CORONARY ANGIOGRAM N/A 12/22/2011   Procedure: LEFT HEART CATHETERIZATION WITH CORONARY ANGIOGRAM;  Surgeon: Troy Sine, MD;  Location: [facility] CATH LAB;  Service: Cardiovascular;  Laterality: N/A;  . LEFT HEART CATHETERIZATION WITH CORONARY ANGIOGRAM Bilateral 05/16/2012   Procedure: LEFT HEART CATHETERIZATION WITH CORONARY ANGIOGRAM;  Surgeon: Lorretta Harp, MD;  Location: [facility] CATH LAB;  Service: Cardiovascular;  Laterality: Bilateral;  . LEFT HEART CATHETERIZATION WITH CORONARY ANGIOGRAM N/A 11/14/2012   Procedure: LEFT HEART CATHETERIZATION WITH CORONARY ANGIOGRAM;  Surgeon: Troy Sine, MD;  Location: [facility] CATH LAB;  Service: Cardiovascular;  Laterality: N/A;  . LEFT HEART CATHETERIZATION WITH CORONARY ANGIOGRAM N/A 09/01/2013   Procedure: LEFT HEART CATHETERIZATION WITH CORONARY ANGIOGRAM;  Surgeon: Leonie Man, MD;  Location: [facility] CATH LAB;  Service: Cardiovascular;  Laterality: N/A;  . [place]; 1973; 1985   x 3  . NM MYOCAR PERF WALL MOTION  08/30/2009   No significant ischemia  . OPEN REDUCTION INTERNAL FIXATION (ORIF) DISTAL RADIAL FRACTURE Right 12/10/2016   Procedure: OPEN REDUCTION INTERNAL FIXATION (ORIF) DISTAL RADIAL FRACTURE;  Surgeon: Iran Planas, MD;  Location: [place];  Service: Orthopedics;  Laterality: Right;  . PERCUTANEOUS CORONARY INTERVENTION-BALLOON ONLY  12/22/2011   Procedure: PERCUTANEOUS CORONARY INTERVENTION-BALLOON ONLY;  Surgeon: Troy Sine, MD;  Location: [place] CATH LAB;  Service: Cardiovascular;;  . PERCUTANEOUS CORONARY INTERVENTION-BALLOON ONLY  11/14/2012   Procedure: PERCUTANEOUS CORONARY INTERVENTION-BALLOON ONLY;  Surgeon: Troy Sine, MD;  Location: [facility]  CATH LAB;  Service: Cardiovascular;;  . PERCUTANEOUS CORONARY STENT INTERVENTION (PCI-S)  05/16/2012   Procedure: PERCUTANEOUS CORONARY STENT INTERVENTION (PCI-S);  Surgeon: Lorretta Harp, MD;  Location: [facility] CATH LAB;  Service: Cardiovascular;;  . PERCUTANEOUS CORONARY STENT INTERVENTION (PCI-S)  09/01/2013   Procedure: PERCUTANEOUS CORONARY STENT INTERVENTION (PCI-S);  Surgeon: Leonie Man, MD;  Location: [facility] CATH LAB;  Service: Cardiovascular;;     Allergies  Allergies  Allergen Reactions  . Ivp Dye [Iodinated Diagnostic Agents] Itching and Rash  . Plavix [Clopidogrel Bisulfate] Rash    HPI 66 y.o. male with a PMH of CAD (multiple PCIs, w/ in-stent restenosis, last PCI in 01/2015), DMII, RA and COPD who presents with inferiorposterior STEMI.  Reports being in normal state of health. Last seen by cardiology in clinic on 10/2016. Able to ambulate freely, no chest pain previously.  However today after returning from church he developed acute onset of chest pain, with some diaphoresis. He came to the ED soon after and found to have a inferior/posterior STEMI.   Taken to the cath lab and found to have CTO of right, Lcx with disease. No PCI given extensive disease and prior stenting - will refer for CT surgery.   Home Medications  Prior to Admission medications   Medication Sig Start Date End Date Taking? Authorizing Provider  acarbose (PRECOSE) 50 MG tablet  03/08/17   [provider]  acetaminophen (TYLENOL) 650 MG CR tablet Take 650 mg by mouth every 8 (eight) hours as needed for pain.    [provider]  ALPRAZolam Duanne Moron) 0.5 MG tablet Take 0.5 mg by mouth 3 (three) times daily as needed for anxiety.     [provider]  AMBULATORY NON FORMULARY MEDICATION Take 600 mg by mouth daily at 12 noon. Medication Name: Dalcetrapib vs placebo Patient taking differently: Take 600 mg by mouth every evening. Medication Name: Dalcetrapib vs placebo: Cholesterol 12/15/16    Jettie Booze, MD  aspirin EC 81 MG EC tablet Take 1 tablet (81 mg total) by mouth daily. 11/15/12   Brett Canales, PA-C  atorvastatin (LIPITOR) 80 MG tablet TAKE 1 TABLET DAILY AT 6 PM. 04/04/17   Croitoru, Dani Gobble, MD  cephALEXin (KEFLEX) 500 MG capsule Take 1 capsule (500 mg total) by mouth every 6 (six) hours. Patient not taking: Reported on 03/27/2017 03/05/17   Orson Eva, MD  doxycycline (VIBRA-TABS) 100 MG tablet  02/14/17   [provider]  fish oil-omega-3 fatty acids 1000 MG capsule Take 1 g by mouth daily.     [provider]  folic acid (FOLVITE) 1 MG tablet Take 1 mg by mouth daily.     [provider]  furosemide (LASIX) 20 MG tablet Take 20 mg by mouth daily.  02/01/17   [provider]  gabapentin (NEURONTIN) 300 MG capsule  03/10/17   [provider]  glipiZIDE (GLUCOTROL) 10 MG tablet Take 10 mg by mouth 2 (two) times daily. 02/12/17   [provider]  Insulin Glargine (LANTUS SOLOSTAR) 100 UNIT/ML Solostar Pen Inject 80 Units into the skin daily at 10 pm.    [provider]  isosorbide mononitrate (IMDUR) 120 MG 24 hr tablet TAKE 1 TABLET (120 MG TOTAL) BY MOUTH DAILY. 04/17/17   Croitoru, Dani Gobble, MD  magnesium oxide (  MAG-OX) 400 MG tablet Take 400 mg by mouth 2 (two) times daily.    [provider]  metFORMIN (GLUCOPHAGE) 1000 MG tablet Take 1 tablet (1,000 mg total) by mouth 2 (two) times daily with a meal. 03/23/15   Bhagat, [place], PA  methotrexate 50 MG/2ML injection Inject 25 mg into the vein every Thursday.    [provider]  metoprolol tartrate (LOPRESSOR) 25 MG tablet TAKE 1 TABLET TWICE DAILY 02/20/17   Croitoru, Dani Gobble, MD  nitroGLYCERIN (NITROSTAT) 0.4 MG SL tablet Place 1 tablet (0.4 mg total) under the tongue every 5 (five) minutes x 3 doses as needed for chest pain. 04/24/16   Croitoru, Mihai, MD  oxyCODONE (OXY IR/ROXICODONE) 5 MG immediate release tablet Take 1 tablet (5 mg total) by  mouth every 4 (four) hours as needed for moderate pain. 03/05/17   Orson Eva, MD  primidone (MYSOLINE) 50 MG tablet Take 50 mg by mouth daily.    [provider]  quinapril (ACCUPRIL) 10 MG tablet Take 10 mg by mouth at bedtime. IN CONJUNCTION WITH ONE-HALF (20 MG) OF A 40 MG TABLET TO EQUAL A TOTAL OF 30 MILLIGRAMS    [provider]  quinapril (ACCUPRIL) 40 MG tablet TAKE 1/2 TABLET AT BEDTIME 04/26/17   Croitoru, Mihai, MD  silver sulfADIAZINE (SILVADENE) 1 % cream Apply 1 application topically daily. Apply to open wounds on left leg daily. Cover with gauze. Wrap left leg snugly with ACE wrap daily. 03/27/17   Alvia Grove, PA-C  Tocilizumab 162 MG/0.9ML SOSY Inject 162 mg into the skin once a week. ACTEMRA    [provider]    Family History  Family History  Problem Relation Age of Onset  . Colon cancer Father   . Hypertension Mother   . Heart disease Mother   . Cancer Mother     Social History  Social History   Social History  . Marital status: Married    Spouse name: N/A  . Number of children: N/A  . Years of education: N/A   Occupational History  . tractor Recruitment consultant    Social History Main Topics  . Smoking status: Current Every Day Smoker    Packs/day: 0.50    Last attempt to quit: 06/11/2015  . Smokeless tobacco: Never Used  . Alcohol use No  . Drug use: No  . Sexual activity: Yes   Other Topics Concern  . Not on file   Social History Narrative  . No narrative on file     Review of Systems General:  No chills, fever, night sweats or weight changes.  Cardiovascular:  +chest pain, dyspnea on exertion, no edema, orthopnea, palpitations, paroxysmal nocturnal dyspnea. Dermatological: No rash, lesions/masses Respiratory: No cough, dyspnea Urologic: No hematuria, dysuria Abdominal:   No nausea, vomiting, diarrhea, bright red blood per rectum, melena, or hematemesis Neurologic:  No visual changes, wkns, changes in mental  status. All other systems reviewed and are otherwise negative except as noted above.  Physical Exam  Blood pressure (!) 180/121, pulse (!) 113, temperature (!) 96.5 F (35.8 C), temperature source Axillary, resp. rate (!) 24, weight 111 kg (244 lb 11.4 oz), SpO2 96 %.  General: Pleasant, NAD Psych: Normal affect. Neuro: Alert and oriented X 3. Moves all extremities spontaneously. HEENT: Normal  Neck: Supple without bruits or JVD. Lungs:  Resp regular and unlabored, CTA. Heart: RRR no s3, s4, or murmurs. Abdomen: Soft, non-tender, non-distended, BS + x 4.  Extremities: No clubbing, cyanosis  or edema. DP/PT/Radials 2+ and equal bilaterally.  Labs  Troponin (Point of Care Test) No results for input(s): TROPIPOC in the last 72 hours. No results for input(s): CKTOTAL, CKMB, TROPONINI in the last 72 hours. Lab Results  Component Value Date   WBC 6.4 03/05/2017   HGB 17.0 03/05/2017   HCT 49.6 03/05/2017   MCV 96.3 03/05/2017   PLT 98 (L) 03/05/2017   No results for input(s): NA, K, CL, CO2, BUN, CREATININE, CALCIUM, PROT, BILITOT, ALKPHOS, ALT, AST, GLUCOSE in the last 168 hours.  Invalid input(s): LABALBU Lab Results  Component Value Date   CHOL 138 09/02/2013   HDL 29 (L) 09/02/2013   LDLCALC 45 09/02/2013   TRIG 322 (H) 09/02/2013   No results found for: DDIMER   Radiology/Studies summary of coronary interventions: -cath 2004 showed total occlusion of RCA and intermediate lesions LAD, high grade mid LCX treated with 4x18 mm BMS (Velocity).  - 2006 overlapping 3.5x23 mm Cypher inLCX  - 2007 new downstream stenosis at OM bifurcation treated with 3x18 mm Cypher stent  - 2008 acute stent thrombosis (allergic to plavix, temporarily stopped Ticlid) - thrombolysis/aspiration only  - April 2013 cutting balloon angioplasty for proximal LCX in stent restenosis (Dr. Claiborne Billings)  - September 2013 repeat restenosis treated with "stent in stent" Xience Expedition 3.25 x 23 mm (Dr.  Gwenlyn Found).  - March 2014 left circumflex coronary artery PCI for restenosis, utilizing cutting balloon atherotomy/noncompliant balloon dilatation  - December 2014 Thrombotic occlusion of restenotic proximal left circumflex stent , new Promus Premier DES 3.5 mm x 24 mm; overlapping the proximal and distal stents - May 2016 cutting balloon angioplasty for in-stent restenosis in the proximal left circumflex coronary artery - July 2016 non STEMI , coronary angiogram unchanged,no intervention performed - No change in RCA or LAD anatomy during last several years.  - Allergic to iodinated contrast, but did well with premedication. Allergic to clopidogrel but tolerates Brilinta and Effient well.  ECG inferiorposterior STEMI  Echocardiogram  Pending   LHC  Acute infero-lateral ST elevation myocardial infarction due to late repeat stent thrombosis involving the circumflex coronary artery. The circumflex has multiple overlapping stents including a proximal bare-metal stent and overlapping Cypher stent and 3 additional second generation drug-eluting stents placed over the past 10 years. Each presentation in the right coronary has been an acute infarct. The last episode was 2016.  Successful angioplasty with restoration of antegrade TIMI grade 3 flow and reducing the 100% thrombotic stenosis to 50%. Repeat stenting was not performed.  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 The distal right coronary fills by collaterals from the LAD.  The LAD is widely patent with 60% segmental proximal to mid stenosis. The first agonal contains 60% stenosis.  Elevated left ventricular end-diastolic pressure, 19 mmHg. ======================================== ASSESSMENT AND PLAN 66 y.o. male with a PMH of CAD (multiple PCIs, w/ in-stent restenosis, last PCI in 01/2015), DMII, RA and COPD who presents with inferiorposterior STEMI.  # STEMI: Taken to the cath lab and found to have CTO of  right, Lcx with disease. Ballooning of the Lcx with TIMI 3 flow. Will refer for CT surgery.  - aggrastat x 24 hour at least - aspirin 81mg  - hold off plavix/brilinta as he will need to be seen by CT surgery for possible bypass - f/u TTE - atorva 80mg   - metop 25mg  BID - lasix IV x 1 for LVEDP 20mmHg - IV nitro PRN - holding ACEi but consider restarting  within 24hr  # PAD - continue ASA  # HTN - metop  # COPD: no PFTs in system - may need PFTs prior to surgery  # HLD -  Statin  # DMII - ISS  FULL CODE  Signed, Charlies Silvers, MD

## 2017-06-06 NOTE — Progress Notes (Signed)
ANTICOAGULATION CONSULT NOTE - Initial Consult  Pharmacy Consult for heparin Indication: chest pain/ACS  Allergies  Allergen Reactions  . Ivp Dye [Iodinated Diagnostic Agents] Itching and Rash  . Plavix [Clopidogrel Bisulfate] Rash    Patient Measurements: Weight: 244 lb 11.4 oz (111 kg) Heparin Dosing Weight: 101 kg  Vital Signs: BP: 185/99 (09/26 2223) Pulse Rate: 108 (09/26 2223)  Labs: No results for input(s): HGB, HCT, PLT, APTT, LABPROT, INR, HEPARINUNFRC, HEPRLOWMOCWT, CREATININE, CKTOTAL, CKMB, TROPONINI in the last 72 hours.  CrCl cannot be calculated (Patient's most recent lab result is older than the maximum 21 days allowed.).   Medical History: Past Medical History:  Diagnosis Date  . Anxiety   . COPD (chronic obstructive pulmonary disease) ([place])   . Coronary artery disease   . Full dentures   . GERD (gastroesophageal reflux disease)    Rolaids as needed  . High cholesterol   . History of MI (myocardial infarction)   . Hypertension    med. dosage increased 04/2016; has been on BP med. x 10 yrs.  . Incarcerated umbilical hernia 38/1017  . Inguinal hernia 05/2016   bilateral   . Insulin dependent diabetes mellitus ([place])   . Ischemic cardiomyopathy    a. 03/2015 EF 40-45% by LV gram.  . Left leg cellulitis 10/08/2016  . Rheumatoid arthritis(714.0)     Assessment: Code stemi No AC pta Historically low plts Starting heparin  Goal of Therapy:  Heparin level 0.3-0.7 units/ml Monitor platelets by anticoagulation protocol: Yes   Plan:  -heparin bolus 4000 units x1 ( given in ED) followed by infusion 1400 units/hr -Daily HL, CBC -F/u after cath  Harvel Quale 06/06/2017,10:29 PM

## 2017-06-07 ENCOUNTER — Inpatient Hospital Stay (HOSPITAL_COMMUNITY): Payer: Medicare HMO

## 2017-06-07 ENCOUNTER — Encounter (HOSPITAL_COMMUNITY): Payer: Self-pay | Admitting: Interventional Cardiology

## 2017-06-07 ENCOUNTER — Encounter (HOSPITAL_COMMUNITY): Payer: Medicare HMO

## 2017-06-07 ENCOUNTER — Other Ambulatory Visit: Payer: Self-pay | Admitting: *Deleted

## 2017-06-07 DIAGNOSIS — I213 ST elevation (STEMI) myocardial infarction of unspecified site: Secondary | ICD-10-CM

## 2017-06-07 DIAGNOSIS — I2510 Atherosclerotic heart disease of native coronary artery without angina pectoris: Secondary | ICD-10-CM

## 2017-06-07 DIAGNOSIS — D696 Thrombocytopenia, unspecified: Secondary | ICD-10-CM

## 2017-06-07 DIAGNOSIS — J449 Chronic obstructive pulmonary disease, unspecified: Secondary | ICD-10-CM

## 2017-06-07 DIAGNOSIS — I25110 Atherosclerotic heart disease of native coronary artery with unstable angina pectoris: Secondary | ICD-10-CM

## 2017-06-07 DIAGNOSIS — I5030 Unspecified diastolic (congestive) heart failure: Secondary | ICD-10-CM

## 2017-06-07 DIAGNOSIS — Z720 Tobacco use: Secondary | ICD-10-CM

## 2017-06-07 DIAGNOSIS — I10 Essential (primary) hypertension: Secondary | ICD-10-CM

## 2017-06-07 LAB — CBC
HCT: 54.3 % — ABNORMAL HIGH (ref 39.0–52.0)
HCT: 53.8 % — ABNORMAL HIGH (ref 39.0–52.0)
Hemoglobin: 19.1 g/dL — ABNORMAL HIGH (ref 13.0–17.0)
Hemoglobin: 18.4 g/dL — ABNORMAL HIGH (ref 13.0–17.0)
MCH: 31.9 pg (ref 26.0–34.0)
MCH: 32.8 pg (ref 26.0–34.0)
MCHC: 34.2 g/dL (ref 30.0–36.0)
MCHC: 35.2 g/dL (ref 30.0–36.0)
MCV: 93.4 fL (ref 78.0–100.0)
MCV: 93.1 fL (ref 78.0–100.0)
PLATELETS: 44 10*3/uL — AB (ref 150–400)
PLATELETS: 109 10*3/uL — AB (ref 150–400)
RBC: 5.83 MIL/uL — ABNORMAL HIGH (ref 4.22–5.81)
RBC: 5.76 MIL/uL (ref 4.22–5.81)
RDW: 13.5 % (ref 11.5–15.5)
RDW: 13.7 % (ref 11.5–15.5)
WBC: 13 10*3/uL — ABNORMAL HIGH (ref 4.0–10.5)
WBC: 13.3 10*3/uL — ABNORMAL HIGH (ref 4.0–10.5)

## 2017-06-07 LAB — COMPREHENSIVE METABOLIC PANEL
ALBUMIN: 3.9 g/dL (ref 3.5–5.0)
ALT: 30 U/L (ref 17–63)
ANION GAP: 15 (ref 5–15)
AST: 75 U/L — ABNORMAL HIGH (ref 15–41)
Alkaline Phosphatase: 103 U/L (ref 38–126)
BUN: 21 mg/dL — ABNORMAL HIGH (ref 6–20)
CO2: 21 mmol/L — AB (ref 22–32)
Calcium: 9 mg/dL (ref 8.9–10.3)
Chloride: 98 mmol/L — ABNORMAL LOW (ref 101–111)
Creatinine, Ser: 1.06 mg/dL (ref 0.61–1.24)
GFR calc non Af Amer: >60 mL/min (ref >60)
GLUCOSE: 287 mg/dL — AB (ref 65–99)
Potassium: 4.4 mmol/L (ref 3.5–5.1)
SODIUM: 134 mmol/L — AB (ref 135–145)
Total Bilirubin: 0.8 mg/dL (ref 0.3–1.2)
Total Protein: 7.3 g/dL (ref 6.5–8.1)

## 2017-06-07 LAB — POCT ACTIVATED CLOTTING TIME
ACTIVATED CLOTTING TIME: 279 s
ACTIVATED CLOTTING TIME: 439 s
ACTIVATED CLOTTING TIME: 494 s

## 2017-06-07 LAB — BASIC METABOLIC PANEL
Anion gap: 12 (ref 5–15)
Anion gap: 10 (ref 5–15)
BUN: 25 mg/dL — ABNORMAL HIGH (ref 6–20)
BUN: 27 mg/dL — ABNORMAL HIGH (ref 6–20)
CALCIUM: 9 mg/dL (ref 8.9–10.3)
CALCIUM: 9 mg/dL (ref 8.9–10.3)
CO2: 22 mmol/L (ref 22–32)
CO2: 25 mmol/L (ref 22–32)
CREATININE: 1.12 mg/dL (ref 0.61–1.24)
CREATININE: 1.1 mg/dL (ref 0.61–1.24)
Chloride: 99 mmol/L — ABNORMAL LOW (ref 101–111)
Chloride: 98 mmol/L — ABNORMAL LOW (ref 101–111)
GFR calc Af Amer: >60 mL/min (ref >60)
GFR calc non Af Amer: >60 mL/min (ref >60)
GLUCOSE: 331 mg/dL — AB (ref 65–99)
Glucose, Bld: 307 mg/dL — ABNORMAL HIGH (ref 65–99)
Potassium: 5.9 mmol/L — ABNORMAL HIGH (ref 3.5–5.1)
Potassium: 4.6 mmol/L (ref 3.5–5.1)
Sodium: 133 mmol/L — ABNORMAL LOW (ref 135–145)
Sodium: 133 mmol/L — ABNORMAL LOW (ref 135–145)

## 2017-06-07 LAB — ECHOCARDIOGRAM COMPLETE
HEIGHTINCHES: 72 in
Weight: 3657.87 oz

## 2017-06-07 LAB — GLUCOSE, CAPILLARY
GLUCOSE-CAPILLARY: 313 mg/dL — AB (ref 65–99)
GLUCOSE-CAPILLARY: 318 mg/dL — AB (ref 65–99)
Glucose-Capillary: 338 mg/dL — ABNORMAL HIGH (ref 65–99)
Glucose-Capillary: 286 mg/dL — ABNORMAL HIGH (ref 65–99)
Glucose-Capillary: 391 mg/dL — ABNORMAL HIGH (ref 65–99)

## 2017-06-07 LAB — POCT I-STAT, CHEM 8
BUN: 25 mg/dL — ABNORMAL HIGH (ref 6–20)
CHLORIDE: 100 mmol/L — AB (ref 101–111)
Calcium, Ion: 1.24 mmol/L (ref 1.15–1.40)
Creatinine, Ser: 0.9 mg/dL (ref 0.61–1.24)
Glucose, Bld: 256 mg/dL — ABNORMAL HIGH (ref 65–99)
HEMATOCRIT: 52 % (ref 39.0–52.0)
Hemoglobin: 17.7 g/dL — ABNORMAL HIGH (ref 13.0–17.0)
POTASSIUM: 4.3 mmol/L (ref 3.5–5.1)
SODIUM: 139 mmol/L (ref 135–145)
TCO2: 25 mmol/L (ref 22–32)

## 2017-06-07 LAB — LIPID PANEL
CHOL/HDL RATIO: 2.9 ratio
Cholesterol: 85 mg/dL (ref 0–200)
HDL: 29 mg/dL — ABNORMAL LOW (ref >40)
LDL CALC: 28 mg/dL (ref 0–99)
TRIGLYCERIDES: 142 mg/dL (ref <150)
VLDL: 28 mg/dL (ref 0–40)

## 2017-06-07 LAB — HEMOGLOBIN A1C
Hgb A1c MFr Bld: 9.1 % — ABNORMAL HIGH (ref 4.8–5.6)
Mean Plasma Glucose: 214.47 mg/dL

## 2017-06-07 LAB — SURGICAL PCR SCREEN
MRSA, PCR: NEGATIVE
Staphylococcus aureus: NEGATIVE

## 2017-06-07 LAB — APTT: aPTT: 52 seconds — ABNORMAL HIGH (ref 24–36)

## 2017-06-07 LAB — TROPONIN I: Troponin I: 6.42 ng/mL (ref <0.03)

## 2017-06-07 MED ORDER — SODIUM CHLORIDE 0.9% FLUSH
3.0000 mL | Freq: Two times a day (BID) | INTRAVENOUS | Status: DC
  Administered 2017-06-07 – 2017-06-11 (×6): 3 mL via INTRAVENOUS

## 2017-06-07 MED ORDER — METOPROLOL TARTRATE 25 MG PO TABS
37.5000 mg | ORAL_TABLET | Freq: Two times a day (BID) | ORAL | Status: DC
  Administered 2017-06-07 – 2017-06-11 (×8): 37.5 mg via ORAL
  Filled 2017-06-07 (×8): qty 1

## 2017-06-07 MED ORDER — INSULIN ASPART 100 UNIT/ML ~~LOC~~ SOLN
5.0000 [IU] | Freq: Three times a day (TID) | SUBCUTANEOUS | Status: DC
  Administered 2017-06-07 – 2017-06-08 (×3): 5 [IU] via SUBCUTANEOUS

## 2017-06-07 MED ORDER — INSULIN ASPART 100 UNIT/ML ~~LOC~~ SOLN
0.0000 [IU] | Freq: Three times a day (TID) | SUBCUTANEOUS | Status: DC
  Administered 2017-06-07 – 2017-06-08 (×2): 11 [IU] via SUBCUTANEOUS
  Administered 2017-06-08 (×2): 20 [IU] via SUBCUTANEOUS
  Administered 2017-06-09: 11 [IU] via SUBCUTANEOUS
  Administered 2017-06-09: 20 [IU] via SUBCUTANEOUS
  Administered 2017-06-09: 3 [IU] via SUBCUTANEOUS
  Administered 2017-06-10: 11 [IU] via SUBCUTANEOUS
  Administered 2017-06-10: 15 [IU] via SUBCUTANEOUS
  Administered 2017-06-10 – 2017-06-11 (×3): 7 [IU] via SUBCUTANEOUS

## 2017-06-07 MED ORDER — TIROFIBAN HCL IN NACL 5-0.9 MG/100ML-% IV SOLN
0.1500 ug/kg/min | INTRAVENOUS | Status: DC
  Administered 2017-06-07 (×3): 0.15 ug/kg/min via INTRAVENOUS
  Filled 2017-06-07 (×3): qty 100

## 2017-06-07 MED ORDER — SODIUM CHLORIDE 0.9% FLUSH: 3.0000 mL | INTRAVENOUS | Status: DC | PRN

## 2017-06-07 MED ORDER — INSULIN ASPART 100 UNIT/ML ~~LOC~~ SOLN
0.0000 [IU] | Freq: Every day | SUBCUTANEOUS | Status: DC
  Administered 2017-06-07: 5 [IU] via SUBCUTANEOUS
  Administered 2017-06-08: 2 [IU] via SUBCUTANEOUS
  Administered 2017-06-09 – 2017-06-10 (×2): 5 [IU] via SUBCUTANEOUS

## 2017-06-07 MED ORDER — SODIUM CHLORIDE 0.9 % IV SOLN
0.1800 mg/kg/h | INTRAVENOUS | Status: DC
  Administered 2017-06-07 – 2017-06-08 (×2): 0.15 mg/kg/h via INTRAVENOUS
  Filled 2017-06-07 (×3): qty 250

## 2017-06-07 MED ORDER — INSULIN ASPART 100 UNIT/ML ~~LOC~~ SOLN
0.0000 [IU] | Freq: Three times a day (TID) | SUBCUTANEOUS | Status: DC
  Administered 2017-06-07: 15 [IU] via SUBCUTANEOUS

## 2017-06-07 MED ORDER — ISOSORBIDE MONONITRATE ER 30 MG PO TB24
30.0000 mg | ORAL_TABLET | Freq: Every day | ORAL | Status: DC
  Administered 2017-06-07 – 2017-06-11 (×5): 30 mg via ORAL
  Filled 2017-06-07 (×5): qty 1

## 2017-06-07 MED ORDER — HYDROCODONE-ACETAMINOPHEN 5-325 MG PO TABS: 1.0000 | ORAL_TABLET | Freq: Four times a day (QID) | ORAL | Status: DC | PRN

## 2017-06-07 MED ORDER — SODIUM CHLORIDE 0.9 % IV SOLN: 250.0000 mL | INTRAVENOUS | Status: DC | PRN

## 2017-06-07 MED FILL — Nitroglycerin IV Soln 100 MCG/ML in D5W: INTRA_ARTERIAL | Qty: 10 | Status: AC

## 2017-06-07 MED FILL — Lidocaine HCl Local Inj 2%: INTRAMUSCULAR | Qty: 10 | Status: AC

## 2017-06-07 NOTE — Progress Notes (Signed)
I went to pts room to do bedside PFT. Pt stated that he was exhausted and would rather do them In the morning. I will check back with him then.

## 2017-06-07 NOTE — Progress Notes (Signed)
  Echocardiogram 2D Echocardiogram has been performed.  [name] 06/07/2017, 12:22 PM

## 2017-06-07 NOTE — Progress Notes (Addendum)
Progress Note  Patient Name: Robert [name] Date of Encounter: 06/07/2017  Primary Cardiologist: Croitoru  Subjective   The patient has no current chest discomfort. 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 He has 6 stents in the right coronary. Most recent intervention was May 2016 when additional overlapping stents were placed further distal in the circumflex artery. He has been off of dual antiplatelet therapy for approximately 8 months. Presented Sunday last night with onset of chest pain and found to have recurrent circumflex late stent thrombosis. Underwent PCI but without repeat stenting.  In Cath Lab heparin and Aggrastat were used. P2 Y 12 therapy was not started to avoid prolonged delay in case surgery becomes an option for more definitive therapy. Blood work this morning demonstrated a significant drop in platelet count from 102,000 on admission to 40,000.  Inpatient Medications    Scheduled Meds: . aspirin EC  81 mg Oral Daily  . atorvastatin  80 mg Oral q1800  . insulin aspart  0-20 Units Subcutaneous TID WC  . insulin glargine  80 Units Subcutaneous Q2200  . metoprolol tartrate  25 mg Oral BID  . primidone  50 mg Oral Daily  . sodium chloride flush  3 mL Intravenous Q12H  . sodium chloride flush  3 mL Intravenous Q12H   Continuous Infusions: . sodium chloride    . sodium chloride Stopped (06/07/17 1142)   PRN Meds: sodium chloride, sodium chloride, acetaminophen, ALPRAZolam, HYDROcodone-acetaminophen, nitroGLYCERIN, ondansetron (ZOFRAN) IV, oxyCODONE, sodium chloride flush, sodium chloride flush   Vital Signs    Vitals:   06/07/17 1000 06/07/17 1100 06/07/17 1200 06/07/17 1215  BP: 135/72 135/72 131/69   Pulse: 97 85 74   Resp: (!) 21 18 (!) 24   Temp:    98.4 F (36.9 C)  TempSrc:    Oral  SpO2: 93% 96% 94%   Weight:      Height:        Intake/Output Summary (Last 24 hours) at  06/07/17 1313 Last data filed at 06/07/17 1200  Gross per 24 hour  Intake           553.89 ml  Output             29 25 ml  Net         -2371.11 ml   Filed Weights   06/06/17 2223 06/07/17 0000 06/07/17 0600  Weight: 244 lb 11.4 oz (111 kg) 243 lb 9.7 oz (110.5 kg) 228 lb 9.9 oz (103.7 kg)    Telemetry    Sinus rhythm with occasional disease- Personally Reviewed  ECG    Performed at 12:54 AM reveals sinus tachycardia with resolving inferior lateral ST elevation. - Personally Reviewed  Physical Exam  Obese, lying comfortably in bed. GEN: No acute distress.   Neck: No JVD Cardiac: RRR, no murmurs, rubs, or gallops. Mild mid right forearm ecchymosis without hematoma. Radial cath site reveals normal pulse and no hematoma. Respiratory: Clear to auscultation bilaterally. GI: Soft, nontender, non-distended  MS: No edema; No deformity. Neuro:  Nonfocal  Psych: Normal affect   Labs    Chemistry Recent Labs Lab 06/07/17 0002 06/07/17 0535 06/07/17 1201  NA 134* 133* 133*  K 4.4 5.9* 4.6  CL 98* 99* 98*  CO2 21* 22 25  GLUCOSE 287* 307* 331*  BUN 21* 25* 27*  CREATININE 1.06 1.12 1.10  CALCIUM 9.0 9.0 9.0  PROT 7.3  --   --  ALBUMIN 3.9  --   --   AST 75*  --   --   ALT 30  --   --   ALKPHOS 103  --   --   BILITOT 0.8  --   --   GFRNONAA >60 >60 >60  GFRAA >60 >60 >60  ANIONGAP 15 12 10      Hematology Recent Labs Lab 06/07/17 0002 06/07/17 0535  WBC 13.0* 13.3*  RBC 5.83* 5.76  HGB 19.1* 18.4*  HCT 54.3* 53.8*  MCV 93.1 93.4  MCH 32.8 31.9  MCHC 35.2 34.2  RDW 13.7 13.5  PLT 109* 44*    Cardiac Enzymes Recent Labs Lab 06/07/17 0002  TROPONINI 6.42*   No results for input(s): TROPIPOC in the last 168 hours.   BNPNo results for input(s): BNP, PROBNP in the last 168 hours.   DDimer No results for input(s): DDIMER in the last 168 hours.   Radiology    No results found.  Cardiac Studies   Coronary angiography and PTCA 06/06/2017: Coronary  Diagrams   Diagnostic Diagram       Post-Intervention Diagram           Patient Profile     66 y.o. male with history of coronary artery disease and multiple acute ST elevation MI presentations involving the circumflex including 2004 bare-metal stent/2006 Cypher stent overlapping/2008 acute stent thrombosis/2013 cutting balloon angioplasty/2014 cutting balloon angioplasty/2014 subacute stent thrombosis treated with Promus Premier DES/May 2016 in-stent restenosis treated with cutting balloon/June 2016 DES overlapping distally. We presented with stent thrombosis 9/26/  Assessment & Plan    1. 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 5 or 6 previously placed stents within the vessel. Angioplasty performed last night reestablish TIMI grade 3 flow. New stents were not placed. P2 Y 12 therapy was not started. Will ask a surgical opinion to see if surgical revascularization as an option. Continue aspirin. Start Angiomax anticoagulation. Hit panel has been ordered. Aggrastat has been discontinued. 2. Thrombocytopenia with decrease in platelet count from 102 K on admission to 40 K this morning. Probably related to Aggrastat. Rule out HIT. Start Angiomax anticoagulation until we hit panel returns and defines whether or not heparin is safe. 3. Tobacco abuse 4. Acute on chronic combined systolic and diastolic heart failure with improvement in dyspnea following diuresis of greater than 2 L post PCI. Echocardiogram is pending 5. Hypertension will be managed with LA nitrates, diuresis, and beta blocker therapy.  For questions or updates, please contact [place] Please consult [email or web address] for contact info under Cardiology/STEMI.      Signed, Sinclair Grooms, MD  06/07/2017, 1:13 PM

## 2017-06-07 NOTE — Consult Note (Signed)
[address]       [place] [address]             [phone]        Hernando D Gladue [place] Medical Record #[id number] Date of Birth: 28-Apr-1951  Referring: No ref. provider found Primary Care: Redmond School, MD  Chief Complaint:    Chief Complaint  Patient presents with  . Code STEMI    History of Present Illness:      Robert [name] is a 66 year old male with past medical history of coronary artery disease with multiple PCI's ( most recent was 01/2015) and in-stent restenosis, diabetes mellitus type 2, RA, hyperlipidemia, COPD, GERD, current smoker, hypertension, ischemic cardiomyopathy, and anxiety who presents to the emergency department with chest pain, cold sweats, and shortness of breath. An EKG was performed which confirmed inferior STEMI. Cardiology was consulted at this time for cardiac cath. The patient received aspirin, sublingual nitroglycerin 4 and morphine. His chest pain was slightly improved.  On 06/06/2017 he underwent cardiac catheterization. He had known chronic total occlusion of the proximal right coronary. The distal right coronary fills by collaterals from LAD. The LAD is widely patent with 60% segmental proximal to mid stenosis. The circumflex has multiple overlapping stents including proximal bare metal stent and overlapping Cypher stent and 3 additional second-generation drug-eluting stents placed over the last 10 years. There was no P2 Y 12 therapy administered at the time of catheterization. IV nitroglycerin, IV Aggrastat, and IV Lasix initiated after catheterization. Current on no IV drips and chest pain free. The patient also mentioned that his activity level at home is limited severely by his rheumatoid arthritis. He recently had bronchitis and went through a full course of antibiotics. He has had vein stripping on the left lower leg. We are consulted for surgical revascularization.    Current Activity/ Functional Status: Patient was  independent with mobility/ambulation, transfers, ADL's, IADL's.   Zubrod Score: At the time of surgery this patient's most appropriate activity status/level should be described as: []     0    Normal activity, no symptoms []     1    Restricted in physical strenuous activity but ambulatory, able to do out light work [x]     2    Ambulatory and capable of self care, unable to do work activities, up and about                 more than 50%  Of the time                            []     3    Only limited self care, in bed greater than 50% of waking hours []     4    Completely disabled, no self care, confined to bed or chair []     5    Moribund  Past Medical History:  Diagnosis Date  . Anxiety   . COPD (chronic obstructive pulmonary disease) ([place])   . Coronary artery disease   . Full dentures   . GERD (gastroesophageal reflux disease)    Rolaids as needed  . High cholesterol   . History of MI (myocardial infarction)   . Hypertension    med. dosage increased 04/2016; has been on BP med. x 10 yrs.  . Incarcerated umbilical hernia 61/9509  . Inguinal hernia 05/2016   bilateral   . Insulin dependent diabetes mellitus (  [place])   . Ischemic cardiomyopathy    a. 03/2015 EF 40-45% by LV gram.  . Left leg cellulitis 10/08/2016  . Rheumatoid arthritis(714.0)     Past Surgical History:  Procedure Laterality Date  . CARDIAC CATHETERIZATION N/A 02/01/2015   Procedure: Left Heart Cath and Coronary Angiography;  Surgeon: Belva Crome, MD;  Location: [place] [facility];  Service: Cardiovascular;  Laterality: N/A;  . CARDIAC CATHETERIZATION N/A 03/22/2015   Procedure: Left Heart Cath and Coronary Angiography;  Surgeon: Leonie Man, MD;  Location: [place] [facility];  Service: Cardiovascular;  Laterality: N/A;  . CARDIAC CATHETERIZATION  09/05/2002; 03/09/2003; 04/10/2005;06/02/2005; 05/09/2006; 02/09/2007; 03/01/2007  . CORONARY ANGIOPLASTY  11/14/2012; 02/01/2015  . CORONARY ANGIOPLASTY WITH STENT PLACEMENT   05/16/2012   "1; makes total ~ 4"  . CORONARY STENT INTERVENTION N/A 06/06/2017   Procedure: CORONARY STENT INTERVENTION;  Surgeon: Belva Crome, MD;  Location: [place] [facility];  Service: Cardiovascular;  Laterality: N/A;  . INSERTION OF MESH Bilateral 05/24/2016   Procedure: INSERTION OF MESH;  Surgeon: Coralie Keens, MD;  Location: [place];  Service: General;  Laterality: Bilateral;  . LAPAROSCOPIC CHOLECYSTECTOMY    . LAPAROSCOPIC INGUINAL HERNIA WITH UMBILICAL HERNIA Bilateral 05/24/2016   Procedure: BILATERAL LAPAROSCOPIC INGUINAL HERNIA REPAIR WITH MESH AND UMBILICAL HERNIA REPAIR WITH MESH;  Surgeon: Coralie Keens, MD;  Location: [place];  Service: General;  Laterality: Bilateral;  . LEFT HEART CATH AND CORONARY ANGIOGRAPHY N/A 06/06/2017   Procedure: LEFT HEART CATH AND CORONARY ANGIOGRAPHY;  Surgeon: Belva Crome, MD;  Location: [place] [facility];  Service: Cardiovascular;  Laterality: N/A;  . LEFT HEART CATHETERIZATION WITH CORONARY ANGIOGRAM N/A 12/22/2011   Procedure: LEFT HEART CATHETERIZATION WITH CORONARY ANGIOGRAM;  Surgeon: Troy Sine, MD;  Location[facility] LAB;  Service: Cardiovascular;  Laterality: N/A;  . LEFT HEART CATHETERIZATION WITH CORONARY ANGIOGRAM Bilateral 05/16/2012   Procedure: LEFT HEART CATHETERIZATION WITH CORONARY ANGIOGRAM;  Surgeon: Lorretta Harp, MD;  Location: [facility] CATH LAB;  Service: Cardiovascular;  Laterality: Bilateral;  . LEFT HEART CATHETERIZATION WITH CORONARY ANGIOGRAM N/A 11/14/2012   Procedure: LEFT HEART CATHETERIZATION WITH CORONARY ANGIOGRAM;  Surgeon: Troy Sine, MD;  Location: [facility] CATH LAB;  Service: Cardiovascular;  Laterality: N/A;  . LEFT HEART CATHETERIZATION WITH CORONARY ANGIOGRAM N/A 09/01/2013   Procedure: LEFT HEART CATHETERIZATION WITH CORONARY ANGIOGRAM;  Surgeon: Leonie Man, MD;  Location: [facility] CATH LAB;  Service: Cardiovascular;  Laterality: N/A;  . [place]; 1973;  1985   x 3  . NM MYOCAR PERF WALL MOTION  08/30/2009   No significant ischemia  . OPEN REDUCTION INTERNAL FIXATION (ORIF) DISTAL RADIAL FRACTURE Right 12/10/2016   Procedure: OPEN REDUCTION INTERNAL FIXATION (ORIF) DISTAL RADIAL FRACTURE;  Surgeon: Iran Planas, MD;  Location: [place];  Service: Orthopedics;  Laterality: Right;  . PERCUTANEOUS CORONARY INTERVENTION-BALLOON ONLY  12/22/2011   Procedure: PERCUTANEOUS CORONARY INTERVENTION-BALLOON ONLY;  Surgeon: Troy Sine, MD;  Location: [facility] CATH LAB;  Service: Cardiovascular;;  . PERCUTANEOUS CORONARY INTERVENTION-BALLOON ONLY  11/14/2012   Procedure: PERCUTANEOUS CORONARY INTERVENTION-BALLOON ONLY;  Surgeon: Troy Sine, MD;  Location: [facility] CATH LAB;  Service: Cardiovascular;;  . PERCUTANEOUS CORONARY STENT INTERVENTION (PCI-S)  05/16/2012   Procedure: PERCUTANEOUS CORONARY STENT INTERVENTION (PCI-S);  Surgeon: Lorretta Harp, MD;  Location: [facility] CATH LAB;  Service: Cardiovascular;;  . PERCUTANEOUS CORONARY STENT INTERVENTION (PCI-S)  09/01/2013   Procedure: PERCUTANEOUS CORONARY STENT INTERVENTION (PCI-S);  Surgeon: Leonie Man, MD;  Location: [facility] CATH LAB;  Service: Cardiovascular;;    History  Smoking Status  . Current Every Day Smoker  . Packs/day: 0.50  . Last attempt to quit: 06/11/2015  Smokeless Tobacco  . Never Used    History  Alcohol Use No    Social History   Social History  . Marital status: Married    Spouse name: N/A  . Number of children: N/A  . Years of education: N/A   Occupational History  . tractor Recruitment consultant    Social History Main Topics  . Smoking status: Current Every Day Smoker    Packs/day: 0.50    Last attempt to quit: 06/11/2015  . Smokeless tobacco: Never Used  . Alcohol use No  . Drug use: No  . Sexual activity: Yes   Other Topics Concern  . Not on file   Social History Narrative  . No narrative on file    Allergies  Allergen Reactions  . Ivp Dye [Iodinated Diagnostic Agents]  Itching and Rash  . Plavix [Clopidogrel Bisulfate] Rash    Current Facility-Administered Medications  Medication Dose Route Frequency Provider Last Rate Last Dose  . 0.9 %  sodium chloride infusion  250 mL Intravenous PRN Belva Crome, MD      . 0.9 %  sodium chloride infusion  250 mL Intravenous PRN Belva Crome, MD   Stopped at 06/07/17 1142  . acetaminophen (TYLENOL) tablet 650 mg  650 mg Oral Q4H PRN Belva Crome, MD      . ALPRAZolam Duanne Moron) tablet 0.5 mg  0.5 mg Oral TID PRN Belva Crome, MD      . aspirin EC tablet 81 mg  81 mg Oral Daily Belva Crome, MD   81 mg at 06/07/17 1002  . atorvastatin (LIPITOR) tablet 80 mg  80 mg Oral q1800 Belva Crome, MD      . HYDROcodone-acetaminophen (NORCO/VICODIN) 5-325 MG per tablet 1 tablet  1 tablet Oral Q6H PRN Durenda Age, MD      . insulin aspart (novoLOG) injection 0-20 Units  0-20 Units Subcutaneous TID WC Belva Crome, MD   15 Units at 06/07/17 1222  . insulin glargine (LANTUS) injection 80 Units  80 Units Subcutaneous Q2200 Belva Crome, MD   80 Units at 06/07/17 0106  . metoprolol tartrate (LOPRESSOR) tablet 25 mg  25 mg Oral BID Belva Crome, MD   25 mg at 06/07/17 1002  . nitroGLYCERIN (NITROSTAT) SL tablet 0.4 mg  0.4 mg Sublingual Q5 Min x 3 PRN Belva Crome, MD      . ondansetron [facility]) injection 4 mg  4 mg Intravenous Q6H PRN Belva Crome, MD      . oxyCODONE (Oxy IR/ROXICODONE) immediate release tablet 5 mg  5 mg Oral Q4H PRN Belva Crome, MD      . primidone (MYSOLINE) tablet 50 mg  50 mg Oral Daily Belva Crome, MD   50 mg at 06/07/17 1003  . sodium chloride flush (NS) 0.9 % injection 3 mL  3 mL Intravenous Q12H Belva Crome, MD      . sodium chloride flush (NS) 0.9 % injection 3 mL  3 mL Intravenous PRN Belva Crome, MD      . sodium chloride flush (NS) 0.9 % injection 3 mL  3 mL Intravenous Q12H Belva Crome, MD   3 mL at 06/07/17 1223  . sodium  chloride flush (NS) 0.9 % injection 3 mL  3 mL  Intravenous PRN Belva Crome, MD        Prescriptions Prior to Admission  Medication Sig Dispense Refill Last Dose  . acarbose (PRECOSE) 50 MG tablet Take 50 mg by mouth 3 (three) times daily with meals.    06/06/2017 at Unknown time  . acetaminophen (TYLENOL) 650 MG CR tablet Take 650 mg by mouth every 8 (eight) hours as needed for pain.   PRN  . ALPRAZolam (XANAX) 0.5 MG tablet Take 0.5 mg by mouth 3 (three) times daily as needed for anxiety.    PRN  . AMBULATORY NON FORMULARY MEDICATION Take 600 mg by mouth daily at 12 noon. Medication Name: Dalcetrapib vs placebo (Patient taking differently: Take 600 mg by mouth every evening. Medication Name: Dalcetrapib vs placebo: Cholesterol)   06/06/2017 at Unknown time  . aspirin EC 81 MG EC tablet Take 1 tablet (81 mg total) by mouth daily.   06/06/2017 at Unknown time  . atorvastatin (LIPITOR) 80 MG tablet TAKE 1 TABLET DAILY AT 6 PM. (Patient taking differently: Take 80 mg by mouth in the evening) 90 tablet 1 06/06/2017 at Unknown time  . fish oil-omega-3 fatty acids 1000 MG capsule Take 1 g by mouth daily.    06/06/2017 at Unknown time  . folic acid (FOLVITE) 1 MG tablet Take 1 mg by mouth daily.    Past Week at Unknown time  . furosemide (LASIX) 20 MG tablet Take 20 mg by mouth daily.    06/06/2017 at Unknown time  . gabapentin (NEURONTIN) 300 MG capsule Take 300 mg by mouth 3 (three) times daily as needed (for pain).    06/06/2017 at Unknown time  . glipiZIDE (GLUCOTROL) 10 MG tablet Take 10 mg by mouth 2 (two) times daily.   06/06/2017 at Unknown time  . Insulin Glargine (LANTUS SOLOSTAR) 100 UNIT/ML Solostar Pen Inject 80 Units into the skin daily at 10 pm.   Past Week at Unknown time  . isosorbide mononitrate (IMDUR) 120 MG 24 hr tablet TAKE 1 TABLET (120 MG TOTAL) BY MOUTH DAILY. 90 tablet 1 Past Week at Unknown time  . magnesium oxide (MAG-OX) 400 MG tablet Take 400 mg by mouth 2 (two) times daily.   Past Week at Unknown time  . metFORMIN  (GLUCOPHAGE) 1000 MG tablet Take 1 tablet (1,000 mg total) by mouth 2 (two) times daily with a meal.   06/06/2017 at Unknown time  . methotrexate 50 MG/2ML injection Inject 25 mg into the vein every Thursday.   Past Week at Unknown time  . metoprolol tartrate (LOPRESSOR) 25 MG tablet TAKE 1 TABLET TWICE DAILY (Patient taking differently: Take 25 mg by mouth twice daily) 180 tablet 3 06/06/2017 at 0900  . nitroGLYCERIN (NITROSTAT) 0.4 MG SL tablet Place 1 tablet (0.4 mg total) under the tongue every 5 (five) minutes x 3 doses as needed for chest pain. 25 tablet 2 06/06/2017 at Unknown time  . oxyCODONE (OXY IR/ROXICODONE) 5 MG immediate release tablet Take 1 tablet (5 mg total) by mouth every 4 (four) hours as needed for moderate pain. 12 tablet 0 Past Week at Unknown time  . primidone (MYSOLINE) 50 MG tablet Take 100 mg by mouth daily.    Past Week at Unknown time  . quinapril (ACCUPRIL) 10 MG tablet Take 10 mg by mouth at bedtime. IN CONJUNCTION WITH ONE-HALF (20 MG) OF A 40 MG TABLET TO EQUAL A TOTAL OF 30 MILLIGRAMS  Past Week at Unknown time  . quinapril (ACCUPRIL) 40 MG tablet TAKE 1/2 TABLET AT BEDTIME (Patient taking differently: TAKE 20 MG TABLET AT BEDTIME, TAKE WITH 10 MG TABLET TO EQUAL 30 MG DAILY) 45 tablet 3 Past Week at Unknown time  . Tocilizumab 162 MG/0.9ML SOSY Inject 162 mg into the skin every Monday. ACTEMRA   Past Week at Unknown time  . silver sulfADIAZINE (SILVADENE) 1 % cream Apply 1 application topically daily. Apply to open wounds on left leg daily. Cover with gauze. Wrap left leg snugly with ACE wrap daily. (Patient not taking: Reported on 06/07/2017) 50 g 0 Completed Course at Unknown time    Family History  Problem Relation Age of Onset  . Colon cancer Father   . Hypertension Mother   . Heart disease Mother   . Cancer Mother      Review of Systems:  Pertinent items are noted in HPI.     Cardiac Review of Systems: Y or N  Chest Pain [ Y   ]  Resting SOB [ Y   ] Exertional SOB  [ Y ]  Orthopnea [  ]   Pedal Edema [ N  ]    Palpitations [ N ] Syncope  [ N ]   Presyncope [   ]  General Review of Systems: [Y] = yes [  ]=no Constitional: recent weight change [ N ]; anorexia [  ]; fatigue [ N ]; nausea [  ]; night sweats [  ]; fever [  ]; or chills [  ]                                                               Dental: poor dentition[  ]; Last Dentist visit:   Eye : blurred vision [  ]; diplopia [   ]; vision changes [  ];  Amaurosis fugax[  ]; Resp: cough [Y  ];  wheezing[N  ];  hemoptysis[  ]; shortness of breath[Y  ]; paroxysmal nocturnal dyspnea[  ]; dyspnea on exertion[ Y ]; or orthopnea[  ];  GI:  gallstones[  ], vomiting[  ];  dysphagia[  ]; melena[  ];  hematochezia [  ]; heartburn[ Y ];   Hx of  Colonoscopy[  ]; GU: kidney stones [  ]; hematuria[  ];   dysuria [  ];  nocturia[  ];  history of obstruction [ N ]; urinary frequency [  ]             Skin: rash, swelling[ N ];, hair loss[  ];  peripheral edema[N  ];  or itching[  ]; Musculosketetal: myalgias[  ];  joint swelling[  ];  joint erythema[  ];  joint pain[Y  ];  back pain[  ];  Heme/Lymph: bruising[  ];  bleeding[  ];  anemia[  ];  Neuro: TIA[N  ];  headaches[  ];  stroke[ N ];  vertigo[  ];  seizures[  ];   paresthesias[  ];  difficulty walking[ Y ];  Psych:depression[ N ]; anxiety[  Y];  Endocrine: diabetes[ Y ];  thyroid dysfunction[N  ];  Immunizations: Flu [  ]; Pneumococcal[  ];  Other:  Physical Exam: BP 131/69 (BP Location: Left Arm)   Pulse 74   Temp  98.4 F (36.9 C) (Oral)   Resp (!) 24   Ht 6' (1.829 m)   Wt 228 lb 9.9 oz (103.7 kg)   SpO2 94%   BMI 31.01 kg/m    General appearance: alert, cooperative and no distress Resp: rhonchi LLL Cardio: regular rate and rhythm, S1, S2 normal, no murmur, click, rub or gallop GI: soft, non-tender; bowel sounds normal; no masses,  no organomegaly Extremities: left lower extremity with venous stasis changes, no  edema Neurologic: Grossly normal  Diagnostic Studies & Laboratory data: Conclusion    Acute infero-lateral ST elevation myocardial infarction due to late repeat stent thrombosis involving the circumflex coronary artery. The circumflex has multiple overlapping stents including a proximal bare-metal stent and overlapping Cypher stent and 3 additional second generation drug-eluting stents placed over the past 10 years. Each presentation in the right coronary has been an acute infarct. The last episode was 2016.  Successful angioplasty with restoration of antegrade TIMI grade 3 flow and reducing the 100% thrombotic stenosis to 50%. Repeat stenting was not performed.  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 The distal right coronary fills by collaterals from the LAD.  The LAD is widely patent with 60% segmental proximal to mid stenosis. The first agonal contains 60% stenosis.  Elevated left ventricular end-diastolic pressure, 19 mmHg.  RECOMMENDATIONS:   Discussed whether the patient is a surgical candidate for management of the chronic total RCA and the circumflex.  P2 Y 12 therapy was not administered.  Continue IV Aggrastat for at least 24 hours.  IV Lasix given to treat dyspnea that developed during the procedure.  IV nitroglycerin  Beta blocker therapy  High intensity statin therapy  Risk factor modification: Continues to smoke cigarettes   Await Echocardiogram results.       Recent Lab Findings: Lab Results  Component Value Date   WBC 13.3 (H) 06/07/2017   HGB 18.4 (H) 06/07/2017   HCT 53.8 (H) 06/07/2017   PLT 44 (L) 06/07/2017   GLUCOSE 331 (H) 06/07/2017   CHOL 85 06/07/2017   TRIG 142 06/07/2017   HDL 29 (L) 06/07/2017   LDLCALC 28 06/07/2017   ALT 30 06/07/2017   AST 75 (H) 06/07/2017   NA 133 (L) 06/07/2017   K 4.6 06/07/2017   CL 98 (L) 06/07/2017   CREATININE 1.10 06/07/2017   BUN 27 (H) 06/07/2017   CO2 25 06/07/2017   TSH  2.904 03/05/2017   INR 1.02 10/08/2016   HGBA1C 9.1 (H) 06/07/2017      Assessment / Plan:    I have explained the procedure to both the patient and patient's wife at the bedside in great detail. I answered all questions from the patient and patient's wife to their satisfaction. I provided smoking cessation information. He will likely need vein mapping.      I  spent 30 minutes counseling the patient face to face and 50% or more the  time was spent in counseling and coordination of care. The total time spent in the appointment was 60 minutes.    Robert Rough, PA-C 06/07/2017 12:48 PM  patient examined and coronary angiogram and ecocardiogram personally reviewed His RCA is a poor target, graft would not help His LAD is not significantly diseased He has low platelets, no saphenous vein in one leg after laser ablation Will see what vein mapping shows Unsure how much benefit he would obtain from single graft to a completed infarct vessel. patient examined and medical record reviewed,agree  with above note. Tharon Aquas Trigt III 06/07/2017

## 2017-06-07 NOTE — Progress Notes (Signed)
ANTICOAGULATION CONSULT NOTE - FOLLOW UP    aPTT = 52 (goal 50-85 sec)   Assessment: 73 YOM presented with STEMI, underwent PCI and found to have restenosis of current stents. No new stents placed and was taken off Aggrastat due to drop in platelets.  HIT panel in process.  Pharmacy consulted to dose bivalirudin while awaiting CABG next week.   First aPTT therapeutic and toward the low end of range.  No bleeding reported.   Plan: Continue bivalirudin at 0.15 mg/kg/hr Recheck aPTT in 2 hrs   Laurieanne Galloway D. Mina Marble, PharmD, BCPS 06/07/2017, 8:02 PM

## 2017-06-07 NOTE — Progress Notes (Signed)
Angiomax started per order. After discussion with pharmacist, baseline labs discontinued to prevent delay in care. Labs ordered for 2 hours after angiomax started.

## 2017-06-07 NOTE — Progress Notes (Addendum)
ANTICOAGULATION CONSULT NOTE - Initial Consult   Pharmacy Consult for Bivalrudin Indication: chest pain/ACS/r/o HIT  Allergies  Allergen Reactions  . Ivp Dye [Iodinated Diagnostic Agents] Itching and Rash  . Plavix [Clopidogrel Bisulfate] Rash    Patient Measurements: Height: 6' (182.9 cm) Weight: 228 lb 9.9 oz (103.7 kg) IBW/kg (Calculated) : 77.6 Heparin Dosing Weight:   Vital Signs: Temp: 98.4 F (36.9 C) (09/27 1215) Temp Source: Oral (09/27 1215) BP: 143/93 (09/27 1300) Pulse Rate: 82 (09/27 1300)  Labs:  Recent Labs  06/07/17 0002 06/07/17 0535 06/07/17 1201  HGB 19.1* 18.4*  --   HCT 54.3* 53.8*  --   PLT 109* 44*  --   CREATININE 1.06 1.12 1.10  TROPONINI 6.42*  --   --     Estimated Creatinine Clearance: 84.4 mL/min (by C-G formula based on SCr of 1.1 mg/dL).   Medical History: Past Medical History:  Diagnosis Date  . Anxiety   . COPD (chronic obstructive pulmonary disease) ([place])   . Coronary artery disease   . Full dentures   . GERD (gastroesophageal reflux disease)    Rolaids as needed  . High cholesterol   . History of MI (myocardial infarction)   . Hypertension    med. dosage increased 04/2016; has been on BP med. x 10 yrs.  . Incarcerated umbilical hernia 12/5995  . Inguinal hernia 05/2016   bilateral   . Insulin dependent diabetes mellitus ([place])   . Ischemic cardiomyopathy    a. 03/2015 EF 40-45% by LV gram.  . Left leg cellulitis 10/08/2016  . Rheumatoid arthritis(714.0)     Medications:  Infusions:  . sodium chloride    . sodium chloride Stopped (06/07/17 1142)  . bivalirudin (ANGIOMAX) infusion 0.5 mg/mL (Non-ACS indications)      Assessment  66 yo male presented with STEMI, underwent PCI and found to have restenosis of current stents in circ. No new stents placed, was placed on Aggrastat in anticipation for possible CABG. Patients plts 44 from 109. Discontinued Aggrastat, and MD ordered HIT panel since patient received heparin  yesterday. Pharmacy consulted to dose direct thrombin inhibitor. CABG likely next Tuesday.   No bleeding reported.     Goal of Therapy:  APTT goal 50-85 Monitor platelets by anticoagulation protocol: Yes   Plan:  Bilvalirudin 0.15 mg/kg/hr Baseline aPTT, CBC, CMET, and INR Check aPTT 2 hours after initiation, then every 2 hours until 2 therapeutic levels obtained Monitor CBC and aPTT daily  Leroy Libman, PharmD Pharmacy Resident Pager: [phone]

## 2017-06-07 NOTE — Progress Notes (Signed)
Inpatient Diabetes Program Recommendations  AACE/ADA: New Consensus Statement on Inpatient Glycemic Control (2015)  Target Ranges:  Prepandial:   less than 140 mg/dL      Peak postprandial:   less than 180 mg/dL (1-2 hours)      Critically ill patients:  140 - 180 mg/dL   Lab Results  Component Value Date   GLUCAP 318 (H) 06/07/2017   HGBA1C 9.1 (H) 06/07/2017    Review of Glycemic Control Results for Robert [name], Robert [name] (MRN [id number]) as of 06/07/2017 11:31  Ref. Range 06/06/2017 23:56 06/07/2017 08:31  Glucose-Capillary Latest Ref Range: 65 - 99 mg/dL 313 (H) 318 (H)   Diabetes history: DM2 Outpatient Diabetes medications:  Lantus 80 units QHS, Glipizide 10 mg BID, Metformin 1000 mg BID Current orders for Inpatient glycemic control: Lantus 80 units daily  Inpatient Diabetes Program Recommendations:    -Glycemic control orders with Novolog correction moderate scale tid + 0-5 units hs -Start Lantus when available from pharmacy  -Novolog 5 units tid meal coverage if eats 50% meals Will follow during hospitalization.  Thank you, Nani Gasser. Paz Winsett, RN, MSN, CDE  Diabetes Coordinator Inpatient Glycemic Control Team Team Pager [phone] (8am-5pm) 06/07/2017 11:39 AM

## 2017-06-08 ENCOUNTER — Inpatient Hospital Stay (HOSPITAL_COMMUNITY): Payer: Medicare HMO

## 2017-06-08 ENCOUNTER — Encounter (HOSPITAL_COMMUNITY): Payer: Medicare HMO

## 2017-06-08 DIAGNOSIS — Z01810 Encounter for preprocedural cardiovascular examination: Secondary | ICD-10-CM

## 2017-06-08 DIAGNOSIS — I2119 ST elevation (STEMI) myocardial infarction involving other coronary artery of inferior wall: Principal | ICD-10-CM

## 2017-06-08 DIAGNOSIS — I255 Ischemic cardiomyopathy: Secondary | ICD-10-CM

## 2017-06-08 DIAGNOSIS — I5042 Chronic combined systolic (congestive) and diastolic (congestive) heart failure: Secondary | ICD-10-CM

## 2017-06-08 LAB — CBC
HEMATOCRIT: 50.1 % (ref 39.0–52.0)
Hemoglobin: 16.9 g/dL (ref 13.0–17.0)
MCH: 31.6 pg (ref 26.0–34.0)
MCHC: 33.7 g/dL (ref 30.0–36.0)
MCV: 93.6 fL (ref 78.0–100.0)
PLATELETS: 43 10*3/uL — AB (ref 150–400)
RBC: 5.35 MIL/uL (ref 4.22–5.81)
RDW: 13.5 % (ref 11.5–15.5)
WBC: 12.1 10*3/uL — ABNORMAL HIGH (ref 4.0–10.5)

## 2017-06-08 LAB — BLOOD GAS, ARTERIAL
Acid-Base Excess: 1.1 mmol/L (ref 0.0–2.0)
Bicarbonate: 26 mmol/L (ref 20.0–28.0)
Drawn by: 414221
O2 Content: 2 L/min
O2 Saturation: 94.6 %
Patient temperature: 98.6
pCO2 arterial: 46.8 mmHg (ref 32.0–48.0)
pH, Arterial: 7.362 (ref 7.350–7.450)
pO2, Arterial: 76.7 mmHg — ABNORMAL LOW (ref 83.0–108.0)

## 2017-06-08 LAB — SPIROMETRY WITH GRAPH
FEF 25-75 Post: 0.93 L/sec
FEF 25-75 Pre: 0.81 L/sec
FEF2575-%Change-Post: 14 %
FEF2575-%Pred-Post: 31 %
FEF2575-%Pred-Pre: 27 %
FEV1-%Change-Post: 4 %
FEV1-%Pred-Post: 40 %
FEV1-%Pred-Pre: 38 %
FEV1-Post: 1.5 L
FEV1-Pre: 1.44 L
FEV1FVC-%Change-Post: 3 %
FEV1FVC-%Pred-Pre: 86 %
FEV6-%Change-Post: 1 %
FEV6-%Pred-Post: 46 %
FEV6-%Pred-Pre: 45 %
FEV6-Post: 2.21 L
FEV6-Pre: 2.17 L
FEV6FVC-%Change-Post: 1 %
FEV6FVC-%Pred-Post: 104 %
FEV6FVC-%Pred-Pre: 103 %
FVC-%Change-Post: 0 %
FVC-%Pred-Post: 44 %
FVC-%Pred-Pre: 44 %
FVC-Post: 2.22 L
FVC-Pre: 2.2 L
Post FEV1/FVC ratio: 68 %
Post FEV6/FVC ratio: 100 %
Pre FEV1/FVC ratio: 65 %
Pre FEV6/FVC Ratio: 98 %

## 2017-06-08 LAB — VAS US DOPPLER PRE CABG
LEFT ECA DIAS: -6 cm/s
LEFT VERTEBRAL DIAS: 13 cm/s
Left CCA dist dias: -17 cm/s
Left CCA dist sys: -82 cm/s
Left CCA prox dias: 9 cm/s
Left CCA prox sys: 112 cm/s
Left ICA dist dias: -17 cm/s
Left ICA dist sys: -71 cm/s
Left ICA prox dias: -11 cm/s
Left ICA prox sys: -56 cm/s
RIGHT ECA DIAS: -2 cm/s
RIGHT VERTEBRAL DIAS: 4 cm/s
Right CCA prox dias: 14 cm/s
Right CCA prox sys: 80 cm/s
Right cca dist sys: -50 cm/s

## 2017-06-08 LAB — BASIC METABOLIC PANEL
ANION GAP: 5 (ref 5–15)
BUN: 22 mg/dL — ABNORMAL HIGH (ref 6–20)
CHLORIDE: 102 mmol/L (ref 101–111)
CO2: 26 mmol/L (ref 22–32)
CREATININE: 0.96 mg/dL (ref 0.61–1.24)
Calcium: 8.5 mg/dL — ABNORMAL LOW (ref 8.9–10.3)
GFR calc non Af Amer: >60 mL/min (ref >60)
Glucose, Bld: 301 mg/dL — ABNORMAL HIGH (ref 65–99)
Potassium: 4.1 mmol/L (ref 3.5–5.1)
SODIUM: 133 mmol/L — AB (ref 135–145)

## 2017-06-08 LAB — HEPARIN INDUCED PLATELET AB (HIT ANTIBODY): Heparin Induced Plt Ab: 0.178 OD (ref 0.000–0.400)

## 2017-06-08 LAB — GLUCOSE, CAPILLARY
GLUCOSE-CAPILLARY: 337 mg/dL — AB (ref 65–99)
GLUCOSE-CAPILLARY: 357 mg/dL — AB (ref 65–99)
Glucose-Capillary: 261 mg/dL — ABNORMAL HIGH (ref 65–99)
Glucose-Capillary: 236 mg/dL — ABNORMAL HIGH (ref 65–99)

## 2017-06-08 LAB — APTT
aPTT: 53 seconds — ABNORMAL HIGH (ref 24–36)
aPTT: 49 seconds — ABNORMAL HIGH (ref 24–36)
aPTT: 58 seconds — ABNORMAL HIGH (ref 24–36)
aPTT: 54 seconds — ABNORMAL HIGH (ref 24–36)

## 2017-06-08 MED ORDER — SODIUM CHLORIDE 0.9 % IV SOLN
0.1900 mg/kg/h | INTRAVENOUS | Status: DC
  Administered 2017-06-08 – 2017-06-11 (×4): 0.19 mg/kg/h via INTRAVENOUS
  Filled 2017-06-08 (×5): qty 250

## 2017-06-08 MED ORDER — AZITHROMYCIN 500 MG PO TABS
500.0000 mg | ORAL_TABLET | Freq: Every day | ORAL | Status: AC
  Administered 2017-06-08: 500 mg via ORAL
  Filled 2017-06-08 (×2): qty 1

## 2017-06-08 MED ORDER — AZITHROMYCIN 250 MG PO TABS
250.0000 mg | ORAL_TABLET | Freq: Every day | ORAL | Status: DC
  Administered 2017-06-09 – 2017-06-11 (×3): 250 mg via ORAL
  Filled 2017-06-08 (×3): qty 1

## 2017-06-08 MED ORDER — ALBUTEROL SULFATE (2.5 MG/3ML) 0.083% IN NEBU
2.5000 mg | INHALATION_SOLUTION | Freq: Once | RESPIRATORY_TRACT | Status: AC
  Administered 2017-06-08: 2.5 mg via RESPIRATORY_TRACT

## 2017-06-08 MED ORDER — GLIPIZIDE 10 MG PO TABS
10.0000 mg | ORAL_TABLET | Freq: Two times a day (BID) | ORAL | Status: DC
  Administered 2017-06-08 – 2017-06-11 (×6): 10 mg via ORAL
  Filled 2017-06-08 (×9): qty 1

## 2017-06-08 MED ORDER — INSULIN ASPART 100 UNIT/ML ~~LOC~~ SOLN
7.0000 [IU] | Freq: Three times a day (TID) | SUBCUTANEOUS | Status: DC
  Administered 2017-06-08 – 2017-06-11 (×8): 7 [IU] via SUBCUTANEOUS

## 2017-06-08 MED ORDER — LORATADINE 10 MG PO TABS
10.0000 mg | ORAL_TABLET | Freq: Every day | ORAL | Status: DC
  Administered 2017-06-08 – 2017-06-11 (×4): 10 mg via ORAL
  Filled 2017-06-08 (×4): qty 1

## 2017-06-08 MED ORDER — INSULIN GLARGINE 100 UNIT/ML ~~LOC~~ SOLN
85.0000 [IU] | Freq: Every day | SUBCUTANEOUS | Status: DC
  Administered 2017-06-08 – 2017-06-10 (×3): 85 [IU] via SUBCUTANEOUS
  Filled 2017-06-08 (×5): qty 0.85

## 2017-06-08 NOTE — Significant Event (Signed)
Spoke with Dr. Tamala Julian about patient continued hyperglycemia >300. Discuss recommendations made by diabetes coordinator and/or possibly placing patient on insulin drip. Per MD, at this time, to follow with DC recommendations. RN to enter these orders in [facility] as MD is in a surgical case.    Robert [name]

## 2017-06-08 NOTE — Progress Notes (Signed)
ANTICOAGULATION CONSULT NOTE - FOLLOW UP  aPTT = 49 (goal 50-85 sec)   Assessment: 79 YOM presented with STEMI, underwent PCI and found to have restenosis of current stents. No new stents placed and was taken off Aggrastat due to drop in platelets.  HIT panel in process.  Pharmacy consulted to dose bivalirudin while awaiting CABG next week.   aPTT subtherapeutic. No bleeding reported.  Plan: Increase bivalirudin at 0.18 mg/kg/hr Recheck aPTT in 2 hrs  Leroy Libman, PharmD Pharmacy Resident Pager: [phone]

## 2017-06-08 NOTE — Progress Notes (Addendum)
ANTICOAGULATION CONSULT NOTE - FOLLOW UP    aPTT = 58 (goal 50-85 sec)   Assessment: 70 YOM presented with STEMI, underwent PCI and found to have restenosis of current stents. No new stents placed and was taken off Aggrastat due to drop in platelets.  HIT panel in process.  Pharmacy consulted to dose bivalirudin while awaiting CABG next week.   First aPTT post rate increase therapeutic.  No bleeding reported.   Plan: Continue bivalirudin at 0.18 mg/kg/hr Recheck aPTT   Jariah Jarmon D. Mina Marble, PharmD, BCPS 06/08/2017, 7:03 PM    ===============================  Addendum: Repeat aPTT therapeutic but toward the low end of normal Increase bivalirudin slightly to 0.18 mg/kg/hr Given aPTT therapeutic x 2, reduce to daily aPTT F/U with transitioning back to IV heparin given negative HIT Ab, plts remain low at 43K so bleeding risk is still present.   Raidon Swanner D. Mina Marble, PharmD, BCPS Pager:  [phone] 06/08/2017, 8:37 PM

## 2017-06-08 NOTE — Progress Notes (Signed)
Pre-op Cardiac Surgery  Carotid Findings:  Bilateral: No significant (1-39%) ICA stenosis. Antegrade vertebral flow.    Upper Extremity Right Left  Brachial Pressures 112 128  Radial Waveforms Tri Tri  Ulnar Waveforms Tri Tri  Palmar Arch (Allen's Test) Normal Decreases >50% with radial compression, normal with ulnar compression     ABI performed 03/22/2017. Right 1.21 Left 0.61. Full results plus arterial duplex in EPIC.    Landry Mellow, RDMS, RVT 06/08/2017

## 2017-06-08 NOTE — Progress Notes (Signed)
ANTICOAGULATION CONSULT NOTE - Follow Up Consult  Pharmacy Consult for Bivalirudin  Indication: chest pain/ACS  Allergies  Allergen Reactions  . Ivp Dye [Iodinated Diagnostic Agents] Itching and Rash  . Plavix [Clopidogrel Bisulfate] Rash    Patient Measurements: Height: 6' (182.9 cm) Weight: 228 lb 9.9 oz (103.7 kg) IBW/kg (Calculated) : 77.6  Vital Signs: Temp: 98.2 F (36.8 C) (09/27 1900) Temp Source: Oral (09/27 1900) BP: 139/78 (09/27 2300) Pulse Rate: 85 (09/27 2300)  Labs:  Recent Labs  06/06/17 2243 06/07/17 0002 06/07/17 0535 06/07/17 1201 06/07/17 1840 06/07/17 2303  HGB 17.7* 19.1* 18.4*  --   --   --   HCT 52.0 54.3* 53.8*  --   --   --   PLT  --  109* 44*  --   --   --   APTT  --   --   --   --  52* 53*  CREATININE 0.90 1.06 1.12 1.10  --   --   TROPONINI  --  6.42*  --   --   --   --     Estimated Creatinine Clearance: 84.4 mL/min (by C-G formula based on SCr of 1.1 mg/dL).    Assessment: Transitioned from Aggrastat to Bivalirudin due to low platelets, currently undergoing work-up for CABG, aPTT is therapeutic x 2   Goal of Therapy:  aPTT 50-85 seconds Monitor platelets by anticoagulation protocol: Yes   Plan:  -Cont Bivalirudin at 0.15 mg/kg/hr -Daily aPTT -F/U CVTS plans  Narda Bonds 06/08/2017,12:34 AM

## 2017-06-08 NOTE — Progress Notes (Signed)
The patient has been seen in conjunction with [name], PA-C. All aspects of care have been considered and discussed. The patient has been personally interviewed, examined, and all clinical data has been reviewed.   Still awaiting the results of the HIT panel.  Resume P2Y12 therapy if he is not felt to be a surgical candidate. Weight found decision  Intensify anti-ischemic regimen - Will increase Imdur to 60 mg daily  Platelet count still less than 50,000. Continue to use bivalirudin until hit panel indicates heparin is safe to use. Once antiplatelet therapy resumed, anticoagulation can be discontinued.   Progress Note  Patient Name: Robert [name] Date of Encounter: 06/08/2017  Primary Cardiologist: Croitoru  Subjective   The patient has no current chest discomfort. 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 He has 6 stents in the right coronary. Most recent intervention was May 2016 when additional overlapping stents were placed further distal in the circumflex artery. He has been off of dual antiplatelet therapy for approximately 8 months. Presented Sunday last night with onset of chest pain and found to have recurrent circumflex late stent thrombosis. Underwent PCI but without repeat stenting.  In Cath Lab heparin and Aggrastat were used. P2 Y 12 therapy was not started to avoid prolonged delay in case surgery becomes an option for more definitive therapy. Blood work this morning demonstrated a significant drop in platelet count from 102,000 on admission to 40,000.  Inpatient Medications    Scheduled Meds: . aspirin EC  81 mg Oral Daily  . atorvastatin  80 mg Oral q1800  . insulin aspart  0-20 Units Subcutaneous TID WC  . insulin aspart  0-5 Units Subcutaneous QHS  . insulin aspart  5 Units Subcutaneous TID WC  . insulin glargine  80 Units Subcutaneous Q2200  . isosorbide mononitrate  30 mg Oral Daily    . metoprolol tartrate  37.5 mg Oral BID  . primidone  50 mg Oral Daily  . sodium chloride flush  3 mL Intravenous Q12H  . sodium chloride flush  3 mL Intravenous Q12H   Continuous Infusions: . sodium chloride    . sodium chloride Stopped (06/07/17 1142)  . bivalirudin (ANGIOMAX) infusion 0.5 mg/mL (Non-ACS indications) 0.18 mg/kg/hr (06/08/17 1054)   PRN Meds: sodium chloride, sodium chloride, acetaminophen, ALPRAZolam, HYDROcodone-acetaminophen, nitroGLYCERIN, ondansetron (ZOFRAN) IV, oxyCODONE, sodium chloride flush, sodium chloride flush   Vital Signs    Vitals:   06/08/17 0900 06/08/17 1000 06/08/17 1006 06/08/17 1100  BP:   120/70   Pulse: 89 90 85 79  Resp: 19 19 (!) 21 (!) 21  Temp:      TempSrc:      SpO2: 96% 95% 93% (!) 89%  Weight:      Height:        Intake/Output Summary (Last 24 hours) at 06/08/17 1152 Last data filed at 06/08/17 1100  Gross per 24 hour  Intake          1914.11 ml  Output             34 25 ml  Net         -1510.89 ml   Filed Weights   06/07/17 0000 06/07/17 0600 06/08/17 0500  Weight: 243 lb 9.7 oz (110.5 kg) 228 lb 9.9 oz (103.7 kg) 234 lb 9.1 oz (106.4 kg)    Telemetry    Sinus rhythm with occasional disease- Personally Reviewed  ECG    Performed at 12:54 AM reveals sinus tachycardia with resolving inferior lateral ST elevation. - Personally Reviewed  Physical Exam  Obese, lying comfortably in bed. GEN: No acute distress.   Neck: No JVD Cardiac: RRR, no murmurs, rubs, or gallops. Mild mid right forearm ecchymosis without hematoma. Radial cath site reveals normal pulse and no hematoma. Respiratory: Clear to auscultation bilaterally. GI: Soft, nontender, non-distended  MS: No edema; No deformity. Neuro:  Nonfocal  Psych: Normal affect   Labs    Chemistry  Recent Labs Lab 06/07/17 0002 06/07/17 0535 06/07/17 1201  NA 134* 133* 133*  K 4.4 5.9* 4.6  CL 98* 99* 98*  CO2 21* 22 25  GLUCOSE 287* 307* 331*  BUN 21* 25*  27*  CREATININE 1.06 1.12 1.10  CALCIUM 9.0 9.0 9.0  PROT 7.3  --   --   ALBUMIN 3.9  --   --   AST 75*  --   --   ALT 30  --   --   ALKPHOS 103  --   --   BILITOT 0.8  --   --   GFRNONAA >60 >60 >60  GFRAA >60 >60 >60  ANIONGAP 15 12 10      Hematology  Recent Labs Lab 06/07/17 0002 06/07/17 0535 06/08/17 0632  WBC 13.0* 13.3* 12.1*  RBC 5.83* 5.76 5.35  HGB 19.1* 18.4* 16.9  HCT 54.3* 53.8* 50.1  MCV 93.1 93.4 93.6  MCH 32.8 31.9 31.6  MCHC 35.2 34.2 33.7  RDW 13.7 13.5 13.5  PLT 109* 44* 43*    Cardiac Enzymes  Recent Labs Lab 06/07/17 0002  TROPONINI 6.42*   No results for input(s): TROPIPOC in the last 168 hours.   BNPNo results for input(s): BNP, PROBNP in the last 168 hours.   DDimer No results for input(s): DDIMER in the last 168 hours.   Radiology    No results found.  Cardiac Studies   Coronary angiography and PTCA 06/06/2017: Coronary Diagrams   Diagnostic Diagram       Post-Intervention Diagram           Patient Profile     66 y.o. male with history of coronary artery disease and multiple acute ST elevation MI presentations involving the circumflex including 2004 bare-metal stent/2006 Cypher stent overlapping/2008 acute stent thrombosis/2013 cutting balloon angioplasty/2014 cutting balloon angioplasty/2014 subacute stent thrombosis treated with Promus Premier DES/May 2016 in-stent restenosis treated with cutting balloon/June 2016 DES overlapping distally. We presented with stent thrombosis 9/26/  Assessment & Plan    1. 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 5 or 6 previously placed stents within the vessel. Angioplasty performed last night reestablish TIMI grade 3 flow. New stents were not placed. P2 Y 12 therapy was not started pending surgical revascularization opinion.  Continue aspirin. Start Angiomax anticoagulation. Hit panel has been ordered. Aggrastat has been discontinued.  2. Thrombocytopenia with decrease in platelet count from 102 K on admission to 43 K this morning. Probably related to Aggrastat. Rule out HIT. Start Angiomax anticoagulation until we hit panel returns and defines whether or not heparin is safe.   3. Tobacco abuse -PFTs done today  4. Acute on chronic combined systolic and diastolic heart failure with improvement in dyspnea following diuresis of greater than 2 L post PCI. Echocardiogram -EF 40-45%  5. Hypertension will be managed with LA  nitrates, diuresis, and beta blocker therapy.  6. Sinusitis- pt c/o sinusitis. He was treated with Doxycycline and Depo Medrol two weeks ago but his symptoms have recurred.   7. 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 IDDM- uncontrolled. Will resume home Glucotrol and ask for input from diabetic RN   Plan: CVTS work up in progress. PFTs done today. Will add decongestant-  Z pack. HIT pedning.    Angelena Form, PA-C  06/08/2017, 11:52 AM

## 2017-06-08 NOTE — Care Management Note (Signed)
Case Management Note  Patient Details  Name: FREEDOM PEDDY MRN: [id number] Date of Birth: 10-16-1950  Subjective/Objective:   From home, presents with  STEMI, underwent PCI and found to have restenosis of currentstents , CVTS consulted for surgical revascularization.               Action/Plan: NCM will follow for dc needs.  Expected Discharge Date:                  Expected Discharge Plan:  Home/Self Care  In-House Referral:     Discharge planning Services  CM Consult  Post Acute Care Choice:    Choice offered to:     DME Arranged:    DME Agency:     HH Arranged:    HH Agency:     Status of Service:  In process, will continue to follow  If discussed at Long Length of Stay Meetings, dates discussed:    Additional Comments:  Zenon Mayo, RN 06/08/2017, 10:52 AM

## 2017-06-08 NOTE — Significant Event (Signed)
Notified and spoke with [name], regarding elevated CBGs. Per PA, stated will adjust his medicines.     Robert [name]

## 2017-06-08 NOTE — Progress Notes (Signed)
Inpatient Diabetes Program Recommendations  AACE/ADA: New Consensus Statement on Inpatient Glycemic Control (2015)  Target Ranges:  Prepandial:   less than 140 mg/dL      Peak postprandial:   less than 180 mg/dL (1-2 hours)      Critically ill patients:  140 - 180 mg/dL   Lab Results  Component Value Date   GLUCAP 337 (H) 06/08/2017   HGBA1C 9.1 (H) 06/07/2017    Review of Glycemic Control  Diabetes history: DM2 Outpatient Diabetes medications: Lantus 80 units QHS, Glipizide 10 mg BID, Metformin 1000 mg BID Current orders for Inpatient glycemic control: Lantus 80 units daily + Novolog 5 tid meal coverage tid + Novolog correction 0-20 units tid + 0-5 units hs  Inpatient Diabetes Program Recommendations:    -Increase Lantus to 85 units daily -Increase Novolog meal coverage to 7 units tid if eats 50% and adjust as needed (noted Glypizide restarted)  Thank you, Nani Gasser. Shann Lewellyn, RN, MSN, CDE  Diabetes Coordinator Inpatient Glycemic Control Team Team Pager [phone] (8am-5pm) 06/08/2017 1:24 PM

## 2017-06-08 NOTE — Progress Notes (Signed)
Right Lower Extremity Vein Map    Right Great Saphenous Vein   Segment Diameter Comment  1. Origin 5.46mm   2. High Thigh 5.26mm   3. Mid Thigh 5.63mm branch  4. Low Thigh 5.29mm   5. At Knee 5.69mm   6. High Calf 4.5mm branch  7. Low Calf 2.82mm Multiple branches  8. Ankle 2.38mm    mm    mm    mm     Landry Mellow, RDMS, RVT 06/08/2017

## 2017-06-09 DIAGNOSIS — E1100 Type 2 diabetes mellitus with hyperosmolarity without nonketotic hyperglycemic-hyperosmolar coma (NKHHC): Secondary | ICD-10-CM

## 2017-06-09 LAB — BASIC METABOLIC PANEL
Anion gap: 5 (ref 5–15)
BUN: 15 mg/dL (ref 6–20)
CO2: 27 mmol/L (ref 22–32)
Calcium: 8.4 mg/dL — ABNORMAL LOW (ref 8.9–10.3)
Chloride: 103 mmol/L (ref 101–111)
Creatinine, Ser: 0.72 mg/dL (ref 0.61–1.24)
GFR calc Af Amer: >60 mL/min (ref >60)
GFR calc non Af Amer: >60 mL/min (ref >60)
Glucose, Bld: 126 mg/dL — ABNORMAL HIGH (ref 65–99)
Potassium: 3.9 mmol/L (ref 3.5–5.1)
Sodium: 135 mmol/L (ref 135–145)

## 2017-06-09 LAB — GLUCOSE, CAPILLARY
GLUCOSE-CAPILLARY: 133 mg/dL — AB (ref 65–99)
GLUCOSE-CAPILLARY: 377 mg/dL — AB (ref 65–99)
GLUCOSE-CAPILLARY: 350 mg/dL — AB (ref 65–99)
Glucose-Capillary: 277 mg/dL — ABNORMAL HIGH (ref 65–99)
Glucose-Capillary: 403 mg/dL — ABNORMAL HIGH (ref 65–99)

## 2017-06-09 LAB — CBC
HEMATOCRIT: 44.2 % (ref 39.0–52.0)
HEMOGLOBIN: 15 g/dL (ref 13.0–17.0)
MCH: 31.5 pg (ref 26.0–34.0)
MCHC: 33.9 g/dL (ref 30.0–36.0)
MCV: 92.9 fL (ref 78.0–100.0)
Platelets: 36 10*3/uL — ABNORMAL LOW (ref 150–400)
RBC: 4.76 MIL/uL (ref 4.22–5.81)
RDW: 13 % (ref 11.5–15.5)
WBC: 7.9 10*3/uL (ref 4.0–10.5)

## 2017-06-09 LAB — APTT: aPTT: 59 seconds — ABNORMAL HIGH (ref 24–36)

## 2017-06-09 NOTE — Progress Notes (Signed)
Pt. Platelets 36 this am.  Cardiology paged.  Awaiting response. Pt. Is calm and resting.

## 2017-06-09 NOTE — Progress Notes (Signed)
Progress Note  Patient Name: Robert [name] Date of Encounter: 06/09/2017  Primary Cardiologist: Croitoru  Subjective   Seen by Dr. Prescott Gum who felt CABG would not benefit him much with single SVG to previously infarcted vessel.   Denies CP. Remains on bival. Platelets 44->43->36k. No bleeding   HIT panel negative   Inpatient Medications    Scheduled Meds: . aspirin EC  81 mg Oral Daily  . atorvastatin  80 mg Oral q1800  . azithromycin  250 mg Oral Daily  . glipiZIDE  10 mg Oral BID AC  . insulin aspart  0-20 Units Subcutaneous TID WC  . insulin aspart  0-5 Units Subcutaneous QHS  . insulin aspart  7 Units Subcutaneous TID WC  . insulin glargine  85 Units Subcutaneous Q2200  . isosorbide mononitrate  30 mg Oral Daily  . loratadine  10 mg Oral Daily  . metoprolol tartrate  37.5 mg Oral BID  . primidone  50 mg Oral Daily  . sodium chloride flush  3 mL Intravenous Q12H  . sodium chloride flush  3 mL Intravenous Q12H   Continuous Infusions: . sodium chloride    . sodium chloride Stopped (06/07/17 1142)  . bivalirudin (ANGIOMAX) infusion 0.5 mg/mL (Non-ACS indications) 0.19 mg/kg/hr (06/09/17 0800)   PRN Meds: sodium chloride, sodium chloride, acetaminophen, ALPRAZolam, HYDROcodone-acetaminophen, nitroGLYCERIN, ondansetron (ZOFRAN) IV, oxyCODONE, sodium chloride flush, sodium chloride flush   Vital Signs    Vitals:   06/09/17 0600 06/09/17 0700 06/09/17 0800 06/09/17 0835  BP: (!) 100/58 (!) 113/55 125/81 125/81  Pulse: 63 62  71  Resp: 16 16 (!) 24   Temp:      TempSrc:      SpO2: 95% 96%    Weight:      Height:        Intake/Output Summary (Last 24 hours) at 06/09/17 1126 Last data filed at 06/09/17 0839  Gross per 24 hour  Intake          2082.69 ml  Output             1750 ml  Net           332.69 ml   Filed Weights   06/07/17 0000 06/07/17 0600 06/08/17 0500  Weight: 110.5 kg (243 lb 9.7 oz) 103.7 kg (228 lb 9.9 oz) 106.4 kg (234 lb 9.1 oz)     Telemetry    Sinus rhythm occasional PVCs. Personally reviewed   ECG    Performed at 12:54 AM reveals sinus tachycardia with resolving inferior lateral ST elevation. - Personally Reviewed  Physical Exam  General:  Well appearing. No resp difficulty HEENT: normal Neck: supple. no JVD. Carotids 2+ bilat; no bruits. No lymphadenopathy or thryomegaly appreciated. Cor: PMI nondisplaced. Regular rate & rhythm. No rubs, gallops or murmurs. Lungs: clear Abdomen: obese soft, nontender, nondistended. No hepatosplenomegaly. No bruits or masses. Good bowel sounds. Extremities: no cyanosis, clubbing, rash, edema Neuro: alert & orientedx3, cranial nerves grossly intact. moves all 4 extremities w/o difficulty. Affect pleasant  Labs    Chemistry  Recent Labs Lab 06/07/17 0002  06/07/17 1201 06/08/17 1605 06/09/17 0455  NA 134*  < > 133* 133* 135  K 4.4  < > 4.6 4.1 3.9  CL 98*  < > 98* 102 103  CO2 21*  < > 25 26 27   GLUCOSE 287*  < > 331* 301* 126*  BUN 21*  < > 27* 22* 15  CREATININE 1.06  < >  1.10 0.96 0.72  CALCIUM 9.0  < > 9.0 8.5* 8.4*  PROT 7.3  --   --   --   --   ALBUMIN 3.9  --   --   --   --   AST 75*  --   --   --   --   ALT 30  --   --   --   --   ALKPHOS 103  --   --   --   --   BILITOT 0.8  --   --   --   --   GFRNONAA >60  < > >60 >60 >60  GFRAA >60  < > >60 >60 >60  ANIONGAP 15  < > 10 5 5   < > = values in this interval not displayed.   Hematology  Recent Labs Lab 06/07/17 0535 06/08/17 0632 06/09/17 0455  WBC 13.3* 12.1* 7.9  RBC 5.76 5.35 4.76  HGB 18.4* 16.9 15.0  HCT 53.8* 50.1 44.2  MCV 93.4 93.6 92.9  MCH 31.9 31.6 31.5  MCHC 34.2 33.7 33.9  RDW 13.5 13.5 13.0  PLT 44* 43* 36*    Cardiac Enzymes  Recent Labs Lab 06/07/17 0002  TROPONINI 6.42*   No results for input(s): TROPIPOC in the last 168 hours.   BNPNo results for input(s): BNP, PROBNP in the last 168 hours.   DDimer No results for input(s): DDIMER in the last 168 hours.    Radiology    No results found.  Cardiac Studies   Coronary angiography and PTCA 06/06/2017: Coronary Diagrams   Diagnostic Diagram       Post-Intervention Diagram           Patient Profile     66 y.o. male with history of coronary artery disease and multiple acute ST elevation MI presentations involving the circumflex including 2004 bare-metal stent/2006 Cypher stent overlapping/2008 acute stent thrombosis/2013 cutting balloon angioplasty/2014 cutting balloon angioplasty/2014 subacute stent thrombosis treated with Promus Premier DES/May 2016 in-stent restenosis treated with cutting balloon/June 2016 DES overlapping distally. He presented with stent thrombosis 9/26. Underwent POBA  Assessment & Plan    1. 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 5 or 6 previously placed stents within the vessel.  -s/p POBA 9/26 (no stent) TIMI grade 3 flow. Not on P2Y12 due to possibility of CABG -remains on bival. -no CP.  -Platelets remain 30-40k. No bleeding. HIT panel negative  -Seen by TCTS who felt he would likely not benefit much from CABG. Final decision pending. Hold P2y12 agents at this point - Continue ASA/statin/bb - CR seeing  2. Thrombocytopenia - PLT count 102 on admit. ? Due to aggrastat - Platelets remain 30-40k. No bleeding. Hgb stable.  HIT panel negative  - Continue bival for now.    3. Tobacco abuse  -PFTs done yesterday -FEV1 1.44 (38%). FVC 2.20 (44%0  4. Acute on chronic combined systolic and diastolic heart failure with improvement in dyspnea following diuresis of greater than 2 L post PCI. Echocardiogram  -EF 40-45% - Volume status stable  5. Hypertension  - Blood pressure well controlled. Continue current regimen.  6. Chronic venous edema- s/p Lt SV ablation Aug 2018  7 IDDM-  - has been  seen by diabetes RN. Appreciate recs, - He is back on glucotrol. Given severe CAD would consider stopping sulfonylurea to metformin at d/c.  Also consider Jardiance.   Can go to Tele. Will restart Brillinta when PLTs > 50K      Signed, Glori Bickers, MD  06/09/2017, 11:26 AM

## 2017-06-09 NOTE — Progress Notes (Signed)
Patient CBG 403, on call PA notified, stated to give 20U novolog. Marcille Blanco, RN

## 2017-06-09 NOTE — Progress Notes (Signed)
ANTICOAGULATION CONSULT NOTE - Follow Up Consult  Pharmacy Consult for Bivalirudin  Indication: chest pain/ACS  Allergies  Allergen Reactions  . Ivp Dye [Iodinated Diagnostic Agents] Itching and Rash  . Plavix [Clopidogrel Bisulfate] Rash    Patient Measurements: Height: 6' (182.9 cm) Weight: 234 lb 9.1 oz (106.4 kg) IBW/kg (Calculated) : 77.6  Vital Signs: Temp: 97.8 F (36.6 C) (09/29 0323) Temp Source: Oral (09/29 0323) BP: 113/55 (09/29 0700) Pulse Rate: 62 (09/29 0700)  Labs:  Recent Labs  06/07/17 0002 06/07/17 0535 06/07/17 1201  06/08/17 0632 06/08/17 1605 06/08/17 1929 06/09/17 0455  HGB 19.1* 18.4*  --   --  16.9  --   --  15.0  HCT 54.3* 53.8*  --   --  50.1  --   --  44.2  PLT 109* 44*  --   --  43*  --   --  36*  APTT  --   --   --   < > 49* 58* 54* 59*  CREATININE 1.06 1.12 1.10  --   --  0.96  --  0.72  TROPONINI 6.42*  --   --   --   --   --   --   --   < > = values in this interval not displayed.  Estimated Creatinine Clearance: 117.6 mL/min (by C-G formula based on SCr of 0.72 mg/dL).  Assessment: 39 YOM presented with STEMI, underwent PCI and found to have restenosis of currentstents. No new stents placed and was taken off Aggrastat due to drop in platelets. HIT panel in process. Pharmacy consulted to dose bivalirudin while awaiting CABG next week.   Aptt within goal range at current bival rate. Hgb trending down now at 15, plt count also continues to trend down at 36. No bleeding issues noted.  HIT ab negative.   Goal of Therapy:  aPTT 50-85 seconds Monitor platelets by anticoagulation protocol: Yes   Plan:  -Cont Bivalirudin at 0.19 mg/kg/hr for now -Daily aPTT -F/U CVTS plans  Erin Hearing PharmD., BCPS Clinical Pharmacist Pager [phone] 06/09/2017 1:26 PM

## 2017-06-10 LAB — CBC
HCT: 43.3 % (ref 39.0–52.0)
HEMOGLOBIN: 14.8 g/dL (ref 13.0–17.0)
MCH: 31.8 pg (ref 26.0–34.0)
MCHC: 34.2 g/dL (ref 30.0–36.0)
MCV: 93.1 fL (ref 78.0–100.0)
Platelets: 42 10*3/uL — ABNORMAL LOW (ref 150–400)
RBC: 4.65 MIL/uL (ref 4.22–5.81)
RDW: 13.1 % (ref 11.5–15.5)
WBC: 8.1 10*3/uL (ref 4.0–10.5)

## 2017-06-10 LAB — GLUCOSE, CAPILLARY
GLUCOSE-CAPILLARY: 271 mg/dL — AB (ref 65–99)
GLUCOSE-CAPILLARY: 271 mg/dL — AB (ref 65–99)
Glucose-Capillary: 240 mg/dL — ABNORMAL HIGH (ref 65–99)
Glucose-Capillary: 345 mg/dL — ABNORMAL HIGH (ref 65–99)
Glucose-Capillary: 481 mg/dL — ABNORMAL HIGH (ref 65–99)
Glucose-Capillary: 402 mg/dL — ABNORMAL HIGH (ref 65–99)

## 2017-06-10 LAB — APTT: APTT: 54 s — AB (ref 24–36)

## 2017-06-10 MED ORDER — LISINOPRIL 10 MG PO TABS: 10.0000 mg | ORAL_TABLET | Freq: Every day | ORAL | Status: DC

## 2017-06-10 MED ORDER — LOSARTAN POTASSIUM 25 MG PO TABS
12.5000 mg | ORAL_TABLET | Freq: Every day | ORAL | Status: DC
  Administered 2017-06-10 – 2017-06-11 (×2): 12.5 mg via ORAL
  Filled 2017-06-10 (×2): qty 1

## 2017-06-10 MED ORDER — ALBUTEROL SULFATE (2.5 MG/3ML) 0.083% IN NEBU
2.5000 mg | INHALATION_SOLUTION | Freq: Four times a day (QID) | RESPIRATORY_TRACT | Status: AC
  Administered 2017-06-10: 2.5 mg via RESPIRATORY_TRACT
  Filled 2017-06-10: qty 3

## 2017-06-10 NOTE — Progress Notes (Signed)
ANTICOAGULATION CONSULT NOTE - Follow Up Consult  Pharmacy Consult for Bival Indication: CAD  Allergies  Allergen Reactions  . Ivp Dye [Iodinated Diagnostic Agents] Itching and Rash  . Plavix [Clopidogrel Bisulfate] Rash    Patient Measurements: Height: 6' (182.9 cm) Weight: 240 lb 1.3 oz (108.9 kg) IBW/kg (Calculated) : 77.6  Vital Signs: Temp: 97.6 F (36.4 C) (09/30 0531) Temp Source: Oral (09/30 0531) BP: 128/67 (09/30 0531) Pulse Rate: 73 (09/30 0531)  Labs:  Recent Labs  06/08/17 3299 06/08/17 1605 06/08/17 1929 06/09/17 0455 06/10/17 0234  HGB 16.9  --   --  15.0 14.8  HCT 50.1  --   --  44.2 43.3  PLT 43*  --   --  36* 42*  APTT 49* 58* 54* 59* 54*  CREATININE  --  0.96  --  0.72  --     Estimated Creatinine Clearance: 118.9 mL/min (by C-G formula based on SCr of 0.72 mg/dL).   Assessment:  Anticoag: Hep SQ for VTE ppx - d/c d/t low plts. Plts 109>42 today. D/C Aggrastat, MD ordered HIT panel since patient received heparin yesterday. Likely not HIT (4T score low).  - Hit ab neg Pharmacy consulted for Bival. APTT 54 in goal. Hgb ok, Plts 42 (goal 50-85)   Goal of Therapy:  aPTT 50-85 seconds Monitor platelets by anticoagulation protocol: Yes   Plan:  Continue Angiomax at 0.19mg /kg/hr Daily aPTT and CBC  Ivan Lacher S. Alford Highland, PharmD, BCPS Clinical Staff Pharmacist Pager [phone]  [place], [place] 06/10/2017,12:03 PM

## 2017-06-10 NOTE — Progress Notes (Signed)
Progress Note  Patient Name: Robert [name] Date of Encounter: 06/10/2017  Subjective   Cough today. No chest pain or SOB   Inpatient Medications    Scheduled Meds: . aspirin EC  81 mg Oral Daily  . atorvastatin  80 mg Oral q1800  . azithromycin  250 mg Oral Daily  . glipiZIDE  10 mg Oral BID AC  . insulin aspart  0-20 Units Subcutaneous TID WC  . insulin aspart  0-5 Units Subcutaneous QHS  . insulin aspart  7 Units Subcutaneous TID WC  . insulin glargine  85 Units Subcutaneous Q2200  . isosorbide mononitrate  30 mg Oral Daily  . loratadine  10 mg Oral Daily  . metoprolol tartrate  37.5 mg Oral BID  . primidone  50 mg Oral Daily  . sodium chloride flush  3 mL Intravenous Q12H  . sodium chloride flush  3 mL Intravenous Q12H   Continuous Infusions: . sodium chloride    . sodium chloride Stopped (06/07/17 1142)  . bivalirudin (ANGIOMAX) infusion 0.5 mg/mL (Non-ACS indications) 0.19 mg/kg/hr (06/09/17 1300)   PRN Meds: sodium chloride, sodium chloride, acetaminophen, ALPRAZolam, HYDROcodone-acetaminophen, nitroGLYCERIN, ondansetron (ZOFRAN) IV, oxyCODONE, sodium chloride flush, sodium chloride flush   Vital Signs    Vitals:   06/09/17 1659 06/09/17 1935 06/10/17 0051 06/10/17 0531  BP: 138/79 (!) 143/79 (!) 141/72 128/67  Pulse: 84 93 74 73  Resp: 18 18 18 18   Temp: (!) 97.5 F (36.4 C) 98.2 F (36.8 C) 98 F (36.7 C) 97.6 F (36.4 C)  TempSrc: Oral Oral Oral Oral  SpO2: 97% 93% 91% 98%  Weight: 240 lb 6.4 oz (109 kg)   240 lb 1.3 oz (108.9 kg)  Height: 6' (1.829 m)       Intake/Output Summary (Last 24 hours) at 06/10/17 1047 Last data filed at 06/10/17 1003  Gross per 24 hour  Intake             1242 ml  Output             2175 ml  Net             -933 ml   Filed Weights   06/08/17 0500 06/09/17 1659 06/10/17 0531  Weight: 234 lb 9.1 oz (106.4 kg) 240 lb 6.4 oz (109 kg) 240 lb 1.3 oz (108.9 kg)    Telemetry    SR - Personally Reviewed  ECG     n/a  Physical Exam   GEN: No acute distress.   Neck: No JVD Cardiac: RRR, no murmurs, rubs, or gallops.  Respiratory: Clear to auscultation bilaterally. GI: Soft, nontender, non-distended  MS: No edema; No deformity. Neuro:  Nonfocal  Psych: Normal affect   Labs    Chemistry Recent Labs Lab 06/07/17 0002  06/07/17 1201 06/08/17 1605 06/09/17 0455  NA 134*  < > 133* 133* 135  K 4.4  < > 4.6 4.1 3.9  CL 98*  < > 98* 102 103  CO2 21*  < > 25 26 27   GLUCOSE 287*  < > 331* 301* 126*  BUN 21*  < > 27* 22* 15  CREATININE 1.06  < > 1.10 0.96 0.72  CALCIUM 9.0  < > 9.0 8.5* 8.4*  PROT 7.3  --   --   --   --   ALBUMIN 3.9  --   --   --   --   AST 75*  --   --   --   --  ALT 30  --   --   --   --   ALKPHOS 103  --   --   --   --   BILITOT 0.8  --   --   --   --   GFRNONAA >60  < > >60 >60 >60  GFRAA >60  < > >60 >60 >60  ANIONGAP 15  < > 10 5 5   < > = values in this interval not displayed.   Hematology Recent Labs Lab 06/08/17 7048 06/09/17 0455 06/10/17 0234  WBC 12.1* 7.9 8.1  RBC 5.35 4.76 4.65  HGB 16.9 15.0 14.8  HCT 50.1 44.2 43.3  MCV 93.6 92.9 93.1  MCH 31.6 31.5 31.8  MCHC 33.7 33.9 34.2  RDW 13.5 13.0 13.1  PLT 43* 36* 42*    Cardiac Enzymes Recent Labs Lab 06/07/17 0002  TROPONINI 6.42*   No results for input(s): TROPIPOC in the last 168 hours.   BNPNo results for input(s): BNP, PROBNP in the last 168 hours.   DDimer No results for input(s): DDIMER in the last 168 hours.   Radiology    No results found.  Cardiac Studies     Patient Profile   Assessment & Plan    1. Recurring ACS/Inferior STEMI -  related to the circumflex coronary artery which is heavily stented and with at least 3 presentations related to subacute/late stent thrombosis. 5 or 6 previously placed stents within the vessel.  - angioplasty performed this admit reestablish TIMI grade 3 flow. New stents were not placed.  - medical therapy with ASA, atorva 80, imdur 30,  lopressor. Restart low dose home quinapril (formulary substitute lisinopril 10mg ) - seen by CT surgery, CABG not recommended at this time, overall few targets.  - will continue bivalirudin, holding P2Y12 until platelets further improve. Once on DAPT can stop anticoag. Platents trending up today  2. Thrombocytopenia - HIT panel negative, started on bivalirudin in interim.Perhaps caused by aggrastat? - continue bivalirudin at this time, once platelets improve can start P2Y12 inhibitor and stop bival  3. 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 Continue to monitor  4. Cough - dry cough x 2 weeks. Some improvement previously with doxy and steroid shot but not resolved.  - start ARB as opposed to home ACE-I. Continue azithromycin. Try some nebs today.  For questions or updates, please contact [place] Please consult [email or web address] for contact info under Cardiology/STEMI.      Merrily Pew, MD  06/10/2017, 10:47 AM

## 2017-06-11 ENCOUNTER — Telehealth: Payer: Self-pay | Admitting: Physician Assistant

## 2017-06-11 DIAGNOSIS — I739 Peripheral vascular disease, unspecified: Secondary | ICD-10-CM

## 2017-06-11 DIAGNOSIS — I2510 Atherosclerotic heart disease of native coronary artery without angina pectoris: Secondary | ICD-10-CM

## 2017-06-11 DIAGNOSIS — E1159 Type 2 diabetes mellitus with other circulatory complications: Secondary | ICD-10-CM

## 2017-06-11 LAB — CBC
HEMATOCRIT: 40.2 % (ref 39.0–52.0)
HEMOGLOBIN: 13.7 g/dL (ref 13.0–17.0)
MCH: 31.9 pg (ref 26.0–34.0)
MCHC: 34.1 g/dL (ref 30.0–36.0)
MCV: 93.7 fL (ref 78.0–100.0)
Platelets: 58 10*3/uL — ABNORMAL LOW (ref 150–400)
RBC: 4.29 MIL/uL (ref 4.22–5.81)
RDW: 13.3 % (ref 11.5–15.5)
WBC: 6.1 10*3/uL (ref 4.0–10.5)

## 2017-06-11 LAB — GLUCOSE, CAPILLARY
GLUCOSE-CAPILLARY: 222 mg/dL — AB (ref 65–99)
GLUCOSE-CAPILLARY: 218 mg/dL — AB (ref 65–99)

## 2017-06-11 LAB — APTT: APTT: 55 s — AB (ref 24–36)

## 2017-06-11 MED ORDER — METOPROLOL TARTRATE 25 MG PO TABS: 37.5000 mg | ORAL_TABLET | Freq: Two times a day (BID) | ORAL | 3 refills | Status: DC

## 2017-06-11 MED ORDER — TICAGRELOR 90 MG PO TABS: 180.0000 mg | ORAL_TABLET | Freq: Once | ORAL | Status: DC

## 2017-06-11 MED ORDER — AZITHROMYCIN 250 MG PO TABS: 250.0000 mg | ORAL_TABLET | Freq: Every day | ORAL | 0 refills | Status: DC

## 2017-06-11 MED ORDER — TICAGRELOR 90 MG PO TABS: 90.0000 mg | ORAL_TABLET | Freq: Two times a day (BID) | ORAL | Status: DC

## 2017-06-11 MED ORDER — LORATADINE 10 MG PO TABS: 10.0000 mg | ORAL_TABLET | Freq: Every day | ORAL | 0 refills | Status: DC

## 2017-06-11 MED ORDER — LOSARTAN POTASSIUM 25 MG PO TABS: 12.5000 mg | ORAL_TABLET | Freq: Every day | ORAL | 6 refills | Status: DC

## 2017-06-11 NOTE — Telephone Encounter (Signed)
TOC - still admitted

## 2017-06-11 NOTE — Progress Notes (Signed)
Orders received for pt discharge.  Discharge summary printed and reviewed with pt.  Explained medication regimen, and pt had no further questions at this time.  IV removed and site remains clean, dry, intact.  Telemetry removed.  Pt in stable condition and awaiting transport. 

## 2017-06-11 NOTE — Progress Notes (Addendum)
CARDIAC REHAB PHASE I   PRE:  Rate/Rhythm: 75 SR  BP:  Sitting: 148/75        SaO2: 96 RA  MODE:  Ambulation: 470 ft   POST:  Rate/Rhythm: 77 SR  BP:  Sitting: 144/77         SaO2: 94 RA  Pt ambulated 470 ft on RA, IV, independent, steady gait, tolerated well with no complaints (second walk today). Completed MI/PCI education with pt and wife. Reviewed risk factors, tobacco cessation (gave pt fake cigarette), MI book, PCI, anti-platelet therapy, activity restrictions, ntg, exercise, heart healthy and diabetes diet handouts and phase 2 cardiac rehab. Pt verbalized understanding, receptive to education. Pt agrees to phase 2 cardiac rehab referral, will send to [place] per pt request. Pt to edge of bed per pt request after walk, call bell within reach. Will follow.     [id number] Lenna Sciara, RN, BSN 06/11/2017 12:11 PM

## 2017-06-11 NOTE — Progress Notes (Addendum)
Progress Note  Patient Name: Robert [name] Date of Encounter: 06/11/2017  Primary Cardiologist: Dr. Sallyanne Kuster  Subjective   Feeling well. No chest pain, sob or palpitations.   Inpatient Medications    Scheduled Meds: . aspirin EC  81 mg Oral Daily  . atorvastatin  80 mg Oral q1800  . azithromycin  250 mg Oral Daily  . glipiZIDE  10 mg Oral BID AC  . insulin aspart  0-20 Units Subcutaneous TID WC  . insulin aspart  0-5 Units Subcutaneous QHS  . insulin aspart  7 Units Subcutaneous TID WC  . insulin glargine  85 Units Subcutaneous Q2200  . isosorbide mononitrate  30 mg Oral Daily  . loratadine  10 mg Oral Daily  . losartan  12.5 mg Oral Daily  . metoprolol tartrate  37.5 mg Oral BID  . primidone  50 mg Oral Daily  . sodium chloride flush  3 mL Intravenous Q12H  . sodium chloride flush  3 mL Intravenous Q12H   Continuous Infusions: . sodium chloride    . sodium chloride Stopped (06/07/17 1142)  . bivalirudin (ANGIOMAX) infusion 0.5 mg/mL (Non-ACS indications) 0.19 mg/kg/hr (06/11/17 0648)   PRN Meds: sodium chloride, sodium chloride, acetaminophen, ALPRAZolam, HYDROcodone-acetaminophen, nitroGLYCERIN, ondansetron (ZOFRAN) IV, oxyCODONE, sodium chloride flush, sodium chloride flush   Vital Signs    Vitals:   06/10/17 1300 06/10/17 1952 06/10/17 2017 06/11/17 0500  BP: 116/64  (!) 141/61 135/68  Pulse: 75  91 78  Resp: 18  18 18   Temp: 97.8 F (36.6 C)   98.1 F (36.7 C)  TempSrc: Oral   Oral  SpO2: 97% 97% 94% 93%  Weight:    243 lb 1.6 oz (110.3 kg)  Height:        Intake/Output Summary (Last 24 hours) at 06/11/17 1116 Last data filed at 06/11/17 0820  Gross per 24 hour  Intake          3027.12 ml  Output             2825 ml  Net           202.12 ml   Filed Weights   06/09/17 1659 06/10/17 0531 06/11/17 0500  Weight: 240 lb 6.4 oz (109 kg) 240 lb 1.3 oz (108.9 kg) 243 lb 1.6 oz (110.3 kg)    Telemetry    SR - Personally Reviewed  ECG    N/a -  Personally Reviewed  Physical Exam   GEN: No acute distress.   Neck: No JVD Cardiac: RRR, no murmurs, rubs, or gallops.  Respiratory: Clear to auscultation bilaterally. GI: Soft, nontender, non-distended  MS: No edema; No deformity. Neuro:  Nonfocal  Psych: Normal affect   Labs    Chemistry Recent Labs Lab 06/07/17 0002  06/07/17 1201 06/08/17 1605 06/09/17 0455  NA 134*  < > 133* 133* 135  K 4.4  < > 4.6 4.1 3.9  CL 98*  < > 98* 102 103  CO2 21*  < > 25 26 27   GLUCOSE 287*  < > 331* 301* 126*  BUN 21*  < > 27* 22* 15  CREATININE 1.06  < > 1.10 0.96 0.72  CALCIUM 9.0  < > 9.0 8.5* 8.4*  PROT 7.3  --   --   --   --   ALBUMIN 3.9  --   --   --   --   AST 75*  --   --   --   --  ALT 30  --   --   --   --   ALKPHOS 103  --   --   --   --   BILITOT 0.8  --   --   --   --   GFRNONAA >60  < > >60 >60 >60  GFRAA >60  < > >60 >60 >60  ANIONGAP 15  < > 10 5 5   < > = values in this interval not displayed.   Hematology Recent Labs Lab 06/09/17 0455 06/10/17 0234 06/11/17 0520  WBC 7.9 8.1 6.1  RBC 4.76 4.65 4.29  HGB 15.0 14.8 13.7  HCT 44.2 43.3 40.2  MCV 92.9 93.1 93.7  MCH 31.5 31.8 31.9  MCHC 33.9 34.2 34.1  RDW 13.0 13.1 13.3  PLT 36* 42* 58*    Cardiac Enzymes Recent Labs Lab 06/07/17 0002  TROPONINI 6.42*   No results for input(s): TROPIPOC in the last 168 hours.   BNPNo results for input(s): BNP, PROBNP in the last 168 hours.   DDimer No results for input(s): DDIMER in the last 168 hours.   Radiology    No results found.  Cardiac Studies    LEFT HEART CATH AND CORONARY ANGIOGRAPHY  06/06/17  Conclusion    Acute infero-lateral ST elevation myocardial infarction due to late repeat stent thrombosis involving the circumflex coronary artery. The circumflex has multiple overlapping stents including a proximal bare-metal stent and overlapping Cypher stent and 3 additional second generation drug-eluting stents placed over the past 10 years. Each  presentation in the right coronary has been an acute infarct. The last episode was 2016.  Successful angioplasty with restoration of antegrade TIMI grade 3 flow and reducing the 100% thrombotic stenosis to 50%. Repeat stenting was not performed.  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 The distal right coronary fills by collaterals from the LAD.  The LAD is widely patent with 60% segmental proximal to mid stenosis. The first agonal contains 60% stenosis.  Elevated left ventricular end-diastolic pressure, 19 mmHg.  RECOMMENDATIONS:   Discuss whether the patient is a surgical candidate for management of chronic total RCA and the circumflex.  P2 Y 12 therapy was not administered.  Continue IV Aggrastat for at least 24 hours.  IV Lasix given to treat dyspnea that developed during the procedure.  IV nitroglycerin  Beta blocker therapy  High intensity statin therapy  Risk factor modification: Continues to smoke cigarettes   Diagnostic Diagram       Post-Intervention Diagram         Echo 06/07/17 Study Conclusions  - Left ventricle: The cavity size was mildly dilated. Wall   thickness was increased in a pattern of mild LVH. Anterolateral   severe hypokinesis. Basal to mid inferolateral akinesis. Basal   inferior akinesis. Systolic function was mildly to moderately   reduced. The estimated ejection fraction was in the range of 40%   to 45%. Doppler parameters are consistent with abnormal left   ventricular relaxation (grade 1 diastolic dysfunction). - Aortic valve: There was no stenosis. - Mitral valve: Mildly calcified annulus. There was trivial   regurgitation. - Left atrium: The atrium was mildly dilated. - Right ventricle: The cavity size was normal. Systolic function   was normal. - Pulmonary arteries: No complete TR doppler jet so unable to   estimate PA systolic pressure. - Inferior vena cava: The vessel was normal in size. The    respirophasic diameter changes were in the normal range (>=  50%),   consistent with normal central venous pressure.  Impressions:  - Mildly dilated LV with mild LV hypertrophy. Wall motion   abnormalities as noted above. EF 40-45%. Normal RV size and   systolic function. No significant valvular abnormalities.  Patient Profile     66 y.o. male 66 y.o. male with history of coronary artery disease and multiple acute ST elevation MI presentations involving the circumflex including 2004 bare-metal stent/2006 Cypher stent overlapping/2008 acute stent thrombosis/2013 cutting balloon angioplasty/2014 cutting balloon angioplasty/2014 subacute stent thrombosis treated with Promus Premier DES/May 2016 in-stent restenosis treated with cutting balloon/June 2016 DES overlapping distally presented with STEMI.  Assessment & Plan    1. 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  - Detailed cath report as above s/p POBA 9/26 (no stent) TIMI grade 3 flow. Did not felt CABG candidate currently.  - Continue Bivalirudin. HIT panel negative.  - Continue current medical therapy. Remained chest pain free.   2. Thrombocytopenia - PLT count 102 on admit. ? Due to aggrastate.  HIT panel negative  - Continue bival for now. Platelets improving slowly. Once platelets improve can start P2Y12 inhibitor and stop bival.   3. 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 Net I & O negative 4.3L.  - Volume status stable. Currently not on diuretics.   4. Hypertension  - Blood pressure well controlled. Continue current regimen.  5. Chronic venous edema- s/p Lt SV ablation Aug 2018  6 IDDM-  - has been seen by diabetes RN. Appreciate recs, - He is back on glucotrol. Given severe CAD  would consider stopping sulfonylurea to metformin at d/c. Also consider Jardiance.   7. Tobacco abuse - Encouraged cessation  For questions or updates, please contact [place] Please consult [email or web address] for contact info under Cardiology/STEMI.      Signed, Leanor Kail, PA  06/11/2017, 11:16 AM    I have examined the patient and reviewed assessment and plan and discussed with patient.  Agree with above as stated.  Stop angiomax.  D/w Dr. Tamala Julian.  I personally reviewed the angiogram.  LAD of moderate severity.  No need for PCI at this time.  He has been ambulating in the halls without difficulty.  Plan for medical therapy with DAPT.  Appears euvolemic.  Plan discharge today.  If platelets increase at OP visit, consider Brilinta as outpatient since he did not tolerate Plavix in the past due to rash.  Larae Grooms

## 2017-06-11 NOTE — Progress Notes (Signed)
Inpatient Diabetes Program Recommendations  AACE/ADA: New Consensus Statement on Inpatient Glycemic Control (2015)  Target Ranges:  Prepandial:   less than 140 mg/dL      Peak postprandial:   less than 180 mg/dL (1-2 hours)      Critically ill patients:  140 - 180 mg/dL   Results for Robert [name], Robert [name] (MRN [id number]) as of 06/11/2017 11:39  Ref. Range 06/10/2017 04:51 06/10/2017 07:27 06/10/2017 11:11 06/10/2017 16:24 06/10/2017 21:08 06/10/2017 23:05 06/11/2017 07:30  Glucose-Capillary Latest Ref Range: 65 - 99 mg/dL 271 (H) 240 (H) 345 (H) 271 (H) 481 (H) 402 (H) 222 (H)   Review of Glycemic Control  Current orders for Inpatient glycemic control: Lantus 85 units QHS, Novolog 7 units TID with meals for meal coverage, Novolog 0-20 units TID with meals, Novolog 0-5 units QHS, Glipizide 10 mg BID  Inpatient Diabetes Program Recommendations:  Insulin - Basal: Please consider increasing Lantus to 88 units QHS. Insulin - Meal Coverage: Please consider increasing meal coverage to Novolog 12 units TID with meals.  Thanks, Barnie Alderman, RN, MSN, CDE Diabetes Coordinator Inpatient Diabetes Program [phone] (Team Pager from 8am to 5pm)

## 2017-06-11 NOTE — Progress Notes (Signed)
ANTICOAGULATION CONSULT NOTE - Follow Up Consult  Pharmacy Consult for Bival Indication: CAD  Allergies  Allergen Reactions  . Ivp Dye [Iodinated Diagnostic Agents] Itching and Rash  . Plavix [Clopidogrel Bisulfate] Rash    Patient Measurements: Height: 6' (182.9 cm) Weight: 243 lb 1.6 oz (110.3 kg) IBW/kg (Calculated) : 77.6  Vital Signs: Temp: 98.1 F (36.7 C) (10/01 0500) Temp Source: Oral (10/01 0500) BP: 135/68 (10/01 0500) Pulse Rate: 78 (10/01 0500)  Labs:  Recent Labs  06/08/17 1605  06/09/17 0455 06/10/17 0234 06/11/17 0520  HGB  --   < > 15.0 14.8 13.7  HCT  --   --  44.2 43.3 40.2  PLT  --   --  36* 42* 58*  APTT 58*  < > 59* 54* 55*  CREATININE 0.96  --  0.72  --   --   < > = values in this interval not displayed.  Estimated Creatinine Clearance: 119.7 mL/min (by C-G formula based on SCr of 0.72 mg/dL).  Assessment:  Anticoag: Hep SQ for VTE ppx - d/c d/t low plts. Plts up 42>58 today. D/C Aggrastat HIT panel ordered by MD(4T score low) - Hit ab neg  Pharmacy consulted for Bival. aPTT 55 in goal. Hgb ok, Plts up 42>> 58 (goal 50-85)  CT surgery NOT recommending CABG at this time!  Goal of Therapy:  aPTT 50-85 seconds Monitor platelets by anticoagulation protocol: Yes   Plan:  Continue bivalirudin 0.19 mg/kg/hr  Daily aPTT and CBC F/U DAPT and starting P2Y12 inhibitor   Georga Bora, PharmD Clinical Pharmacist 06/11/2017 10:19 AM

## 2017-06-11 NOTE — Discharge Summary (Signed)
Discharge Summary    Patient ID: Robert [name],  MRN: [id number], DOB/AGE: [birth year] 64 y.o.  Admit date: 06/06/2017 Discharge date: 06/11/2017  Primary Care Provider: Redmond [employer] Primary Cardiologist: Dr. Sallyanne Kuster  Discharge Diagnoses    Principal Problem:   Acute ST elevation myocardial infarction (STEMI) of inferior wall [facility]) Active Problems:   Tobacco abuse   PVD, chronic LLE   Rheumatoid arthritis ([place])   CAD (coronary artery disease)   ICM-EF 35% at cath 02/01/15   Type II diabetes mellitus ([place])   Essential hypertension   Chronic combined systolic and diastolic CHF (congestive heart failure) (HCC)   Thrombocytopenia (HCC)   Varicose veins of left lower extremity with complications   Allergies Allergies  Allergen Reactions  . Ivp Dye [Iodinated Diagnostic Agents] Itching and Rash  . Plavix [Clopidogrel Bisulfate] Rash    Diagnostic Studies/Procedures    LEFT HEART CATH AND CORONARY ANGIOGRAPHY  06/06/17  Conclusion    Acute infero-lateral ST elevation myocardial infarction due to late repeat stent thrombosis involving the circumflex coronary artery. The circumflex has multiple overlapping stents including a proximal bare-metal stent and overlapping Cypher stent and 3 additional second generation drug-eluting stents placed over the past 10 years. Each presentation in the right coronary has been an acute infarct. The last episode was 2016.  Successful angioplasty with restoration of antegrade TIMI grade 3 flow and reducing the 100% thrombotic stenosis to 50%. Repeat stenting was not performed.  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 The distal right coronary fills by collaterals from the LAD.  The LAD is widely patent with 60% segmental proximal to mid stenosis. The first agonal contains 60% stenosis.  Elevated left ventricular end-diastolic pressure, 19 mmHg.  RECOMMENDATIONS:   Discuss whether the patient is a surgical  candidate for management of chronic total RCA and the circumflex.  P2 Y 12 therapy was not administered.  Continue IV Aggrastat for at least 24 hours.  IV Lasix given to treat dyspnea that developed during the procedure.  IV nitroglycerin  Beta blocker therapy  High intensity statin therapy  Risk factor modification: Continues to smoke cigarettes   Diagnostic Diagram       Post-Intervention Diagram         Echo 06/07/17 Study Conclusions  - Left ventricle: The cavity size was mildly dilated. Wall thickness was increased in a pattern of mild LVH. Anterolateral severe hypokinesis. Basal to mid inferolateral akinesis. Basal inferior akinesis. Systolic function was mildly to moderately reduced. The estimated ejection fraction was in the range of 40% to 45%. Doppler parameters are consistent with abnormal left ventricular relaxation (grade 1 diastolic dysfunction). - Aortic valve: There was no stenosis. - Mitral valve: Mildly calcified annulus. There was trivial regurgitation. - Left atrium: The atrium was mildly dilated. - Right ventricle: The cavity size was normal. Systolic function was normal. - Pulmonary arteries: No complete TR doppler jet so unable to estimate PA systolic pressure. - Inferior vena cava: The vessel was normal in size. The respirophasic diameter changes were in the normal range (>= 50%), consistent with normal central venous pressure.  Impressions:  - Mildly dilated LV with mild LV hypertrophy. Wall motion abnormalities as noted above. EF 40-45%. Normal RV size and systolic function. No significant valvular abnormalities.    History of Present Illness     66 y.o. male 66 y.o.malewith history of coronary artery disease and multiple acute ST elevation MI presentations involving the circumflex including 2004 bare-metal stent/2006  Cypher stent overlapping/2008 acute stent thrombosis/2013 cutting balloon  angioplasty/2014 cutting balloon angioplasty/2014 subacute stent thrombosis treated with Promus Premier DES/May 2016 in-stent restenosis treated with cutting balloon/June 2016 DES overlapping distally presented with STEMI.  Hospital Course     Consultants: CTCA  1. Recurring acute coronary syndrome/inferior ST elevation MIrelated to the circumflex coronary artery which is heavily stented and with at least 3 presentations related to subacute/late stent thrombosis. - Detailed cath report as above s/p POBA 9/26 (no stent) TIMI grade 3 flow. Did not felt CABG candidate currently.  - Treated with Bivalirudin. HIT panel negative.  - Continue current medical therapy. Remained chest pain free with ambulation. Plan for DAPT. If platelets increase at OP visit, consider Brilinta as outpatient since he did not tolerate Plavix in the past due to rash.  2. Thrombocytopenia - PLT count 102 on admit. ? Due to aggrastate. HIT panel negative  Platelets improving slowly. Once platelets improve can start P2Y12 inhibitor. Observed for 2 hours after stopping Bivalirudin.   3. Acute on chronic combined systolic and diastolic heart failure/ICM - Echo showed LVEF of 40-45% (reduced from prior). Net I & O negative 4.3L.  - Volume status stable. Resumed home dose of lasix at discharge.   4. Hypertension - Blood pressure well controlled. Continue current regimen.  5. 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 Follow up with PCP.  Given severe CAD would consider stopping sulfonylurea to metformin.  7. Tobacco abuse - Encouraged cessation  8. Sinusitis- pt c/o sinusitis. He was treated with Doxycycline and Depo Medrol two weeks ago but his symptoms have recurred. Treated with Z-pack. Will complete 5 days of course.   The patient has been seen by Dr. Irish Lack today and deemed ready for discharge home. All follow-up appointments have been scheduled. Discharge  medications are listed below.    Discharge Vitals Blood pressure 135/68, pulse 78, temperature 98.1 F (36.7 C), temperature source Oral, resp. rate 18, height 6' (1.829 m), weight 243 lb 1.6 oz (110.3 kg), SpO2 93 %.  Filed Weights   06/09/17 1659 06/10/17 0531 06/11/17 0500  Weight: 240 lb 6.4 oz (109 kg) 240 lb 1.3 oz (108.9 kg) 243 lb 1.6 oz (110.3 kg)    Labs & Radiologic Studies     CBC  Recent Labs  06/10/17 0234 06/11/17 0520  WBC 8.1 6.1  HGB 14.8 13.7  HCT 43.3 40.2  MCV 93.1 93.7  PLT 42* 58*   Basic Metabolic Panel  Recent Labs  06/08/17 1605 06/09/17 0455  NA 133* 135  K 4.1 3.9  CL 102 103  CO2 26 27  GLUCOSE 301* 126*  BUN 22* 15  CREATININE 0.96 0.72  CALCIUM 8.5* 8.4*     No results found.  Disposition   Pt is being discharged home today in good condition.  Follow-up Plans & Appointments    Follow-up Information    Barrett, Evelene Croon, PA-C. Go on 06/20/2017.   Specialties:  Cardiology, Radiology Why:  @9am  TCM follow up. please arrive 15 minutes early.  Contact information: [address] [place] [address] [phone]        Redmond School, MD. Schedule an appointment as soon as possible for a visit in 5 day(s).   Specialty:  Internal Medicine Why:  for post hospital  Contact information: [address] [place] [address] [phone]          Discharge Instructions  Amb Referral to Cardiac Rehabilitation    Complete by:  As directed    Diagnosis:   PTCA STEMI     Diet - low sodium heart healthy    Complete by:  As directed    Discharge instructions    Complete by:  As directed    No driving for 2 weeks. No lifting over 10 lbs for 4 weeks. No sexual activity for 4 weeks. You may not return to work until cleared by your cardiologist. Keep procedure site clean & dry. If you notice increased pain, swelling, bleeding or pus, call/return!  You may shower, but no soaking baths/hot tubs/pools  for 1 week.   Increase activity slowly    Complete by:  As directed       Discharge Medications   Current Discharge Medication List    START taking these medications   Details  azithromycin (ZITHROMAX) 250 MG tablet Take 1 tablet (250 mg total) by mouth daily. Qty: 1 each, Refills: 0    loratadine (CLARITIN) 10 MG tablet Take 1 tablet (10 mg total) by mouth daily. Qty: 30 tablet, Refills: 0    losartan (COZAAR) 25 MG tablet Take 0.5 tablets (12.5 mg total) by mouth daily. Qty: 30 tablet, Refills: 6      CONTINUE these medications which have CHANGED   Details  metoprolol tartrate (LOPRESSOR) 25 MG tablet Take 1.5 tablets (37.5 mg total) by mouth 2 (two) times daily. Qty: 60 tablet, Refills: 3      CONTINUE these medications which have NOT CHANGED   Details  acarbose (PRECOSE) 50 MG tablet Take 50 mg by mouth 3 (three) times daily with meals.     acetaminophen (TYLENOL) 650 MG CR tablet Take 650 mg by mouth every 8 (eight) hours as needed for pain.    ALPRAZolam (XANAX) 0.5 MG tablet Take 0.5 mg by mouth 3 (three) times daily as needed for anxiety.     AMBULATORY NON FORMULARY MEDICATION Take 600 mg by mouth daily at 12 noon. Medication Name: Dalcetrapib vs placebo    aspirin EC 81 MG EC tablet Take 1 tablet (81 mg total) by mouth daily.    atorvastatin (LIPITOR) 80 MG tablet TAKE 1 TABLET DAILY AT 6 PM. Qty: 90 tablet, Refills: 1    fish oil-omega-3 fatty acids 1000 MG capsule Take 1 g by mouth daily.     folic acid (FOLVITE) 1 MG tablet Take 1 mg by mouth daily.     furosemide (LASIX) 20 MG tablet Take 20 mg by mouth daily.     gabapentin (NEURONTIN) 300 MG capsule Take 300 mg by mouth 3 (three) times daily as needed (for pain).     glipiZIDE (GLUCOTROL) 10 MG tablet Take 10 mg by mouth 2 (two) times daily.    Insulin Glargine (LANTUS SOLOSTAR) 100 UNIT/ML Solostar Pen Inject 80 Units into the skin daily at 10 pm.    isosorbide mononitrate (IMDUR) 120 MG 24 hr  tablet TAKE 1 TABLET (120 MG TOTAL) BY MOUTH DAILY. Qty: 90 tablet, Refills: 1    magnesium oxide (MAG-OX) 400 MG tablet Take 400 mg by mouth 2 (two) times daily.    metFORMIN (GLUCOPHAGE) 1000 MG tablet Take 1 tablet (1,000 mg total) by mouth 2 (two) times daily with a meal.    methotrexate 50 MG/2ML injection Inject 25 mg into the vein every Thursday.    nitroGLYCERIN (NITROSTAT) 0.4 MG SL tablet Place 1 tablet (0.4 mg total) under the tongue every 5 (five)  minutes x 3 doses as needed for chest pain. Qty: 25 tablet, Refills: 2    oxyCODONE (OXY IR/ROXICODONE) 5 MG immediate release tablet Take 1 tablet (5 mg total) by mouth every 4 (four) hours as needed for moderate pain. Qty: 12 tablet, Refills: 0    primidone (MYSOLINE) 50 MG tablet Take 100 mg by mouth daily.     Tocilizumab 162 MG/0.9ML SOSY Inject 162 mg into the skin every Monday. ACTEMRA      STOP taking these medications     quinapril (ACCUPRIL) 10 MG tablet      quinapril (ACCUPRIL) 40 MG tablet      silver sulfADIAZINE (SILVADENE) 1 % cream          Aspirin prescribed at discharge?  Yes High Intensity Statin Prescribed? (Lipitor 40-80mg  or Crestor 20-40mg ): Yes Beta Blocker Prescribed? Yes For EF 45% or less, Was ACEI/ARB Prescribed? Yes ADP Receptor Inhibitor Prescribed? (i.e. Plavix etc.-Includes Medically Managed Patients): Plan to start Brillinta as outpatient once platelets improves For EF <40%, Aldosterone Inhibitor Prescribed? N/A Was EF assessed during THIS hospitalization? Yes Was Cardiac Rehab II ordered? (Included Medically managed Patients): Yes   Outstanding Labs/Studies   CBC  Duration of Discharge Encounter   Greater than 30 minutes including physician time.  Signed, Bhagat,Bhavinkumar PA-C 06/11/2017, 2:03 PM  I have examined the patient and reviewed assessment and plan and discussed with patient.  Agree with above as stated.  Platelet count increasing.  Plan to restart Brilinta when plt  > 100.  OK to discharge today.  Please see my note from earlier today.  He needs to avoid tobacco.  Continue aggressive medical therapy.  Larae Grooms

## 2017-06-11 NOTE — Care Management Important Message (Signed)
Important Message  Patient Details  Name: Robert [name] MRN: [id number] Date of Birth: 05/28/1951   Medicare Important Message Given:  Yes    [place] 06/11/2017, 1:16 PM

## 2017-06-11 NOTE — Consult Note (Signed)
   [facility] CM Inpatient Consult   06/11/2017  Robert [name] 08-Mar-1951 [id number]  Patient assessed for admission in the [place].  Admitted with an Acute STEMI s/p PTCA.  Patient's primary care provider is Dr. Redmond School, [facility]. Patient was busy receiving discharge instructions.  No needs noted at this time.   Natividad Brood, RN BSN [facility] Liaison  [phone] business mobile phone Toll free office [phone]

## 2017-06-11 NOTE — Telephone Encounter (Signed)
TOC Pt-Please call-Pt has an appointment 06-20-17 with Rosaria Ferries.

## 2017-06-11 NOTE — Progress Notes (Signed)
5 Days Post-Op Procedure(s) (LRB): LEFT HEART CATH AND CORONARY ANGIOGRAPHY (N/A) CORONARY STENT INTERVENTION (N/A) Subjective: Severe thrombocytopenia continues, DM poor control No chest pain rec medical therapy   Objective: Vital signs in last 24 hours: Temp:  [97.8 F (36.6 C)-98.1 F (36.7 C)] 98.1 F (36.7 C) (10/01 0500) Pulse Rate:  [75-91] 78 (10/01 0500) Cardiac Rhythm: Normal sinus rhythm (10/01 0700) Resp:  [18] 18 (10/01 0500) BP: (116-141)/(61-68) 135/68 (10/01 0500) SpO2:  [93 %-97 %] 93 % (10/01 0500) Weight:  [243 lb 1.6 oz (110.3 kg)] 243 lb 1.6 oz (110.3 kg) (10/01 0500)  Hemodynamic parameters for last 24 hours:    Intake/Output from previous day: 09/30 0701 - 10/01 0700 In: 2842.1 [P.O.:1274; I.V.:1568.1] Out: 1093 [Urine:3275] Intake/Output this shift: No intake/output data recorded.    Lab Results:  Recent Labs  06/10/17 0234 06/11/17 0520  WBC 8.1 6.1  HGB 14.8 13.7  HCT 43.3 40.2  PLT 42* 58*   BMET:  Recent Labs  06/08/17 1605 06/09/17 0455  NA 133* 135  K 4.1 3.9  CL 102 103  CO2 26 27  GLUCOSE 301* 126*  BUN 22* 15  CREATININE 0.96 0.72  CALCIUM 8.5* 8.4*    PT/INR: No results for input(s): LABPROT, INR in the last 72 hours. ABG    Component Value Date/Time   PHART 7.362 06/08/2017 0355   HCO3 26.0 06/08/2017 0355   TCO2 25 06/06/2017 2243   O2SAT 94.6 06/08/2017 0355   CBG (last 3)   Recent Labs  06/10/17 2108 06/10/17 2305 06/11/17 0730  GLUCAP 481* 402* 222*    Assessment/Plan: S/P Procedure(s) (LRB): LEFT HEART CATH AND CORONARY ANGIOGRAPHY (N/A) CORONARY STENT INTERVENTION (N/A) cabg would not benefit patient at this time   LOS: 5 days    Robert [name] 06/11/2017

## 2017-06-12 SURGERY — CORONARY ARTERY BYPASS GRAFTING (CABG)
Anesthesia: General | Site: Chest

## 2017-06-12 NOTE — Telephone Encounter (Signed)
Patient contacted regarding discharge from 06/06/2017 - 06/11/2017 (5 days), [place]  Patient understands to follow up with provider appointment 06-20-17 @9am  with Rosaria Ferries on here at our [facility] office.. Patient understands discharge instructions? yes Patient understands medications and regiment? yes Patient understands to bring all medications to this visit? Yes and to arrive early to check in and fill out paperwork.  Pt states that he feels fine but a little nervous, will discuss at appt scheduled.

## 2017-06-13 DIAGNOSIS — I219 Acute myocardial infarction, unspecified: Secondary | ICD-10-CM | POA: Diagnosis not present

## 2017-06-13 DIAGNOSIS — Z23 Encounter for immunization: Secondary | ICD-10-CM | POA: Diagnosis not present

## 2017-06-13 DIAGNOSIS — I2510 Atherosclerotic heart disease of native coronary artery without angina pectoris: Secondary | ICD-10-CM | POA: Diagnosis not present

## 2017-06-13 DIAGNOSIS — I509 Heart failure, unspecified: Secondary | ICD-10-CM | POA: Diagnosis not present

## 2017-06-13 DIAGNOSIS — Z6832 Body mass index (BMI) 32.0-32.9, adult: Secondary | ICD-10-CM | POA: Diagnosis not present

## 2017-06-13 DIAGNOSIS — R0601 Orthopnea: Secondary | ICD-10-CM | POA: Diagnosis not present

## 2017-06-13 DIAGNOSIS — I214 Non-ST elevation (NSTEMI) myocardial infarction: Secondary | ICD-10-CM | POA: Diagnosis not present

## 2017-06-13 DIAGNOSIS — I200 Unstable angina: Secondary | ICD-10-CM | POA: Diagnosis not present

## 2017-06-13 DIAGNOSIS — E1165 Type 2 diabetes mellitus with hyperglycemia: Secondary | ICD-10-CM | POA: Diagnosis not present

## 2017-06-13 DIAGNOSIS — R201 Hypoesthesia of skin: Secondary | ICD-10-CM | POA: Diagnosis not present

## 2017-06-20 ENCOUNTER — Ambulatory Visit (INDEPENDENT_AMBULATORY_CARE_PROVIDER_SITE_OTHER): Payer: Medicare HMO | Admitting: Physician Assistant

## 2017-06-20 ENCOUNTER — Encounter: Payer: Self-pay | Admitting: Physician Assistant

## 2017-06-20 VITALS — BP 146/80 | HR 81 | Ht 72.0 in | Wt 242.8 lb

## 2017-06-20 DIAGNOSIS — I255 Ischemic cardiomyopathy: Secondary | ICD-10-CM

## 2017-06-20 DIAGNOSIS — I10 Essential (primary) hypertension: Secondary | ICD-10-CM | POA: Diagnosis not present

## 2017-06-20 DIAGNOSIS — I2119 ST elevation (STEMI) myocardial infarction involving other coronary artery of inferior wall: Secondary | ICD-10-CM

## 2017-06-20 NOTE — Patient Instructions (Signed)
Medication Instructions:  NO CHANGES If you need a refill on your cardiac medications before your next appointment, please call your pharmacy.  Follow-Up: Your physician wants you to follow-up in: [place].   Special Instructions: MAKE SURE TO STICK TO 2 LITERS/QUART FLUID RESTRICTION  INCREASE ACTIVITY AS TOLERATED NOT OVER DO   2,000MG DAILY LOW SODIUM DIET  MAKE SURE TO DISCUSS VERTIGO WITH YOUR PRIMARY MD  MAKE SURE THAT PRIMARY MD IS DOING YOUR FASTING CHOLESTEROL LAB-PLEASE HAM THEM FAX THE RESULTS TO [phone] FOR [place] TO REVIEW  Thank you for choosing [facility] at [facility]!!    Fluid Restriction Some health conditions may require you to restrict your fluid intake. This means that you need to limit the amount of fluid you drink each day. When you have a fluid restriction, you must carefully measure and keep track of the amount of fluid you drink. Your health care provider will identify the specific amount of fluid you are allowed each day. This amount may depend on several things, such as:  The amount of urine you produce in a day.  How much fluid you are keeping (retaining) in your body.  Your blood pressure.  What is my plan? Your health care provider recommends that you limit your fluid intake to __________ per day. What counts toward my fluid intake? Your fluid intake includes all liquids that you drink, as well as any foods that become liquid at room temperature. The following are examples of some fluids that you will have to restrict:  Tea, coffee, soda, lemonade, milk, water, juice, sport drinks, and nutritional supplement beverages.  Alcoholic beverages.  Cream.  Gravy.  Ice cubes.  Soup and broth.  The following are examples of foods that become liquid at room temperature. These foods will also count toward your fluid intake.  Ice cream and ice milk.  Frozen yogurt and sherbet.  Frozen ice pops.  Flavored gelatin.  How  do I keep track of my fluid intake? Each morning, fill a jug with the amount of water that equals the amount of fluid you are allowed for the day. You can use this water as a guideline for fluid allowance. Each time you take in any form of fluid, including ice cubes and foods that become liquid at room temperature, pour an equal amount of water out of the container. This helps you to see how much fluid you are taking in. It also helps you to see how much of your fluid intake is left for the rest of the day. The following conversions may also be helpful in measuring your fluid intake:  1 cup equals 8 oz (240 mL).   cup equals 6 oz (180 mL).  ? cup equals 5? oz (160 mL).   cup equals 4 oz (120 mL).  ? cup equals 2? oz (80 mL).   cup equals 2 oz (60 mL).  2 Tbsp equals 1 oz (30 mL).  What home care instructions should I follow while restricting fluids?  Make sure that you stay within the recommended limit each day. Always measure and keep track of your fluids, as well as any foods that turn liquid at room temperature.  Use small cups and glasses and learn to sip fluids slowly.  Add a slice of fresh lemon or lemon juice to water or ice. This helps to satisfy your thirst.  Freeze fruit juice or water in an ice cube tray. Use this as part of your fluid allowance.  These cubes are useful for quenching your thirst. Measure the amount of liquid in each ice cube prior to freezing so you can subtract this amount from your day's allowance when you consume each frozen cube.  Try frozen fruits between meals, such as grapes or strawberries.  Swallow your pills along with meals or soft foods, such as applesauce or mashed potatoes. This helps you to save your fluid allowance for something that you enjoy.  Weigh yourself every day. Keeping track of your daily weight can help you and your health care provider to notice as soon as possible if you are retaining too much fluid in your body. ? Weigh  yourself every morning after you urinate but before you eat breakfast. ? Wear the same amount of clothing each time you weigh yourself. ? Write down your daily weight. Give this weight record to your health care provider. If your weight is going up, you may be retaining too much fluid. Every 2 cups (480 mL) of fluid retained in the body becomes an extra 1 lb (0.45 kg) of body weight.  Avoid salty foods. These foods make you thirsty and make fluid control more difficult.  Brush your teeth often or rinse your mouth with mouthwash to help your dry mouth. Lemon wedges, hard sour candies, chewing gum, or breath spray may also help to moisten your mouth.  Keep the temperature in your home at a cooler level. Dry air increases thirst, so keep the air in your home as humid as possible.  Avoid being out in the hot sun, which can cause you to sweat and become thirsty. What are some signs that I may be taking in too much fluid? You may be taking in too much fluid if:  Your weight increases. Contact your health care provider if your weight increases 3 lb or more in a day or if it increases 5 lb or more in a week.  Your face, hands, legs, feet, and belly (abdomen) start to swell.  You have trouble breathing.  This information is not intended to replace advice given to you by your health care provider. Make sure you discuss any questions you have with your health care provider. Document Released: 06/25/2007 Document Revised: 02/03/2016 Document Reviewed: 01/27/2014 Elsevier Interactive Patient Education  2018 [employer].   Low-Sodium Eating Plan Sodium, which is an element that makes up salt, helps you maintain a healthy balance of fluids in your body. Too much sodium can increase your blood pressure and cause fluid and waste to be held in your body. Your health care provider or dietitian may recommend following this plan if you have high blood pressure (hypertension), kidney disease, liver disease,  or heart failure. Eating less sodium can help lower your blood pressure, reduce swelling, and protect your heart, liver, and kidneys. What are tips for following this plan? General guidelines  Most people on this plan should limit their sodium intake to 1,500-2,000 mg (milligrams) of sodium each day. Reading food labels  The Nutrition Facts label lists the amount of sodium in one serving of the food. If you eat more than one serving, you must multiply the listed amount of sodium by the number of servings.  Choose foods with less than 140 mg of sodium per serving.  Avoid foods with 300 mg of sodium or more per serving. Shopping  Look for lower-sodium products, often labeled as "low-sodium" or "no salt added."  Always check the sodium content even if foods are labeled as "unsalted"  or "no salt added".  Buy fresh foods. ? Avoid canned foods and premade or frozen meals. ? Avoid canned, cured, or processed meats  Buy breads that have less than 80 mg of sodium per slice. Cooking  Eat more home-cooked food and less restaurant, buffet, and fast food.  Avoid adding salt when cooking. Use salt-free seasonings or herbs instead of table salt or sea salt. Check with your health care provider or pharmacist before using salt substitutes.  Cook with plant-based oils, such as canola, sunflower, or olive oil. Meal planning  When eating at a restaurant, ask that your food be prepared with less salt or no salt, if possible.  Avoid foods that contain MSG (monosodium glutamate). MSG is sometimes added to [place] food, bouillon, and some canned foods. What foods are recommended? The items listed may not be a complete list. Talk with your dietitian about what dietary choices are best for you. Grains Low-sodium cereals, including oats, puffed wheat and rice, and shredded wheat. Low-sodium crackers. Unsalted rice. Unsalted pasta. Low-sodium bread. Whole-grain breads and whole-grain  pasta. Vegetables Fresh or frozen vegetables. "No salt added" canned vegetables. "No salt added" tomato sauce and paste. Low-sodium or reduced-sodium tomato and vegetable juice. Fruits Fresh, frozen, or canned fruit. Fruit juice. Meats and other protein foods Fresh or frozen (no salt added) meat, poultry, seafood, and fish. Low-sodium canned tuna and salmon. Unsalted nuts. Dried peas, beans, and lentils without added salt. Unsalted canned beans. Eggs. Unsalted nut butters. Dairy Milk. Soy milk. Cheese that is naturally low in sodium, such as ricotta cheese, fresh mozzarella, or Swiss cheese Low-sodium or reduced-sodium cheese. Cream cheese. Yogurt. Fats and oils Unsalted butter. Unsalted margarine with no trans fat. Vegetable oils such as canola or olive oils. Seasonings and other foods Fresh and dried herbs and spices. Salt-free seasonings. Low-sodium mustard and ketchup. Sodium-free salad dressing. Sodium-free light mayonnaise. Fresh or refrigerated horseradish. Lemon juice. Vinegar. Homemade, reduced-sodium, or low-sodium soups. Unsalted popcorn and pretzels. Low-salt or salt-free chips. What foods are not recommended? The items listed may not be a complete list. Talk with your dietitian about what dietary choices are best for you. Grains Instant hot cereals. Bread stuffing, pancake, and biscuit mixes. Croutons. Seasoned rice or pasta mixes. Noodle soup cups. Boxed or frozen macaroni and cheese. Regular salted crackers. Self-rising flour. Vegetables Sauerkraut, pickled vegetables, and relishes. Olives. [place] fries. Onion rings. Regular canned vegetables (not low-sodium or reduced-sodium). Regular canned tomato sauce and paste (not low-sodium or reduced-sodium). Regular tomato and vegetable juice (not low-sodium or reduced-sodium). Frozen vegetables in sauces. Meats and other protein foods Meat or fish that is salted, canned, smoked, spiced, or pickled. Bacon, ham, sausage, hotdogs, corned  beef, chipped beef, packaged lunch meats, salt pork, jerky, pickled herring, anchovies, regular canned tuna, sardines, salted nuts. Dairy Processed cheese and cheese spreads. Cheese curds. Blue cheese. Feta cheese. String cheese. Regular cottage cheese. Buttermilk. Canned milk. Fats and oils Salted butter. Regular margarine. Ghee. Bacon fat. Seasonings and other foods Onion salt, garlic salt, seasoned salt, table salt, and sea salt. Canned and packaged gravies. Worcestershire sauce. Tartar sauce. Barbecue sauce. Teriyaki sauce. Soy sauce, including reduced-sodium. Steak sauce. Fish sauce. Oyster sauce. Cocktail sauce. Horseradish that you find on the shelf. Regular ketchup and mustard. Meat flavorings and tenderizers. Bouillon cubes. Hot sauce and Tabasco sauce. Premade or packaged marinades. Premade or packaged taco seasonings. Relishes. Regular salad dressings. Salsa. Potato and tortilla chips. Corn chips and puffs. Salted popcorn and pretzels. Canned or dried  soups. Pizza. Frozen entrees and pot pies. Summary  Eating less sodium can help lower your blood pressure, reduce swelling, and protect your heart, liver, and kidneys.  Most people on this plan should limit their sodium intake to 1,500-2,000 mg (milligrams) of sodium each day.  Canned, boxed, and frozen foods are high in sodium. Restaurant foods, fast foods, and pizza are also very high in sodium. You also get sodium by adding salt to food.  Try to cook at home, eat more fresh fruits and vegetables, and eat less fast food, canned, processed, or prepared foods. This information is not intended to replace advice given to you by your health care provider. Make sure you discuss any questions you have with your health care provider. Document Released: 02/17/2002 Document Revised: 08/21/2016 Document Reviewed: 08/21/2016 Elsevier Interactive Patient Education  2017 [employer].

## 2017-06-20 NOTE — Progress Notes (Addendum)
Cardiology Office Note   Date:  06/20/2017   ID:  Robert [name], DOB [birth year], MRN [id number]  PCP:  Redmond School, MD  Cardiologist:  Dr Sallyanne Kuster 12/09/2016 Rosaria Ferries, PA-C   Chief Complaint  Patient presents with  . CATH f/u with Intervention    pt states he fell in March and had fracture in neck--c/o worsening dizziness when he lays down, occasionally when sitting up.    History of Present Illness: Robert [name] is a 66 y.o. male with a history of STEMIs 2nd CFX and mult PCIs, HLD, COPD, HTN, RA  Admitted 09/26-10/01 for STEMI, s/p PTCA CFX for ISR, RCA CTO, EF 40-45% by echo  Robert [name] presents for cardiology follow up.   He has done well since d/c. He is very limited by his RA in his knees. [facility] [place]. He will check his insurance. He has not had any chest pain. He has not had any DOE. He denies orthopnea or PND.  He has problems with the room spinning when he lays down. This has been a problem since he fell and had a skull fx. Has never been evaluated for vertigo.  He has occasional daytime LE edema, goes away with the Lasix. He has frequent nocturia. When he first gets up, he is SOB, but it resolves in a few minutes. He drinks > 2 L/day. His wife cooks mostly fresh food, very little hidden sodium. His wife is sure that he drinks more than 2 L per day.  No palpitations.   His BP is high, but he has not had his meds today. He does not routinely check his blood pressure, but can.   Past Medical History:  Diagnosis Date  . Anxiety   . COPD (chronic obstructive pulmonary disease) ([place])   . Coronary artery disease   . Full dentures   . GERD (gastroesophageal reflux disease)    Rolaids as needed  . High cholesterol   . History of MI (myocardial infarction)   . Hypertension    med. dosage increased 04/2016; has been on BP med. x 10 yrs.  . Incarcerated umbilical hernia 85/4627  . Inguinal hernia 05/2016   bilateral   . Insulin dependent diabetes mellitus ([place])   . Ischemic cardiomyopathy    a. 03/2015 EF 40-45% by LV gram.  . Left leg cellulitis 10/08/2016  . Rheumatoid arthritis(714.0)     Past Surgical History:  Procedure Laterality Date  . CARDIAC CATHETERIZATION N/A 02/01/2015   Procedure: Left Heart Cath and Coronary Angiography;  Surgeon: Belva Crome, MD;  Location: [place] [facility];  Service: Cardiovascular;  Laterality: N/A;  . CARDIAC CATHETERIZATION N/A 03/22/2015   Procedure: Left Heart Cath and Coronary Angiography;  Surgeon: Leonie Man, MD;  Location: [place] [facility];  Service: Cardiovascular;  Laterality: N/A;  . CARDIAC CATHETERIZATION  09/05/2002; 03/09/2003; 04/10/2005;06/02/2005; 05/09/2006; 02/09/2007; 03/01/2007  . CORONARY ANGIOPLASTY  11/14/2012; 02/01/2015  . CORONARY ANGIOPLASTY WITH STENT PLACEMENT  05/16/2012   "1; makes total ~ 4"  . CORONARY STENT INTERVENTION N/A 06/06/2017   Procedure: CORONARY STENT INTERVENTION;  Surgeon: Belva Crome, MD;  Location: [place] [facility];  Service: Cardiovascular;  Laterality: N/A;  . INSERTION OF MESH Bilateral 05/24/2016   Procedure: INSERTION OF MESH;  Surgeon: Coralie Keens, MD;  Location: [place];  Service: General;  Laterality: Bilateral;  . LAPAROSCOPIC CHOLECYSTECTOMY    .  LAPAROSCOPIC INGUINAL HERNIA WITH UMBILICAL HERNIA Bilateral 05/24/2016   Procedure: BILATERAL LAPAROSCOPIC INGUINAL HERNIA REPAIR WITH MESH AND UMBILICAL HERNIA REPAIR WITH MESH;  Surgeon: Coralie Keens, MD;  Location: [place];  Service: General;  Laterality: Bilateral;  . LEFT HEART CATH AND CORONARY ANGIOGRAPHY N/A 06/06/2017   Procedure: LEFT HEART CATH AND CORONARY ANGIOGRAPHY;  Surgeon: Belva Crome, MD;  Location: [place] [facility];  Service: Cardiovascular;  Laterality: N/A;  . LEFT HEART CATHETERIZATION WITH CORONARY ANGIOGRAM N/A 12/22/2011   Procedure: LEFT HEART CATHETERIZATION WITH CORONARY  ANGIOGRAM;  Surgeon: Troy Sine, MD;  Location: [facility] CATH LAB;  Service: Cardiovascular;  Laterality: N/A;  . LEFT HEART CATHETERIZATION WITH CORONARY ANGIOGRAM Bilateral 05/16/2012   Procedure: LEFT HEART CATHETERIZATION WITH CORONARY ANGIOGRAM;  Surgeon: Lorretta Harp, MD;  Location: [facility] CATH LAB;  Service: Cardiovascular;  Laterality: Bilateral;  . LEFT HEART CATHETERIZATION WITH CORONARY ANGIOGRAM N/A 11/14/2012   Procedure: LEFT HEART CATHETERIZATION WITH CORONARY ANGIOGRAM;  Surgeon: Troy Sine, MD;  Location: [facility] CATH LAB;  Service: Cardiovascular;  Laterality: N/A;  . LEFT HEART CATHETERIZATION WITH CORONARY ANGIOGRAM N/A 09/01/2013   Procedure: LEFT HEART CATHETERIZATION WITH CORONARY ANGIOGRAM;  Surgeon: Leonie Man, MD;  Location: [facility] CATH LAB;  Service: Cardiovascular;  Laterality: N/A;  . [place]; 1973; 1985   x 3  . NM MYOCAR PERF WALL MOTION  08/30/2009   No significant ischemia  . OPEN REDUCTION INTERNAL FIXATION (ORIF) DISTAL RADIAL FRACTURE Right 12/10/2016   Procedure: OPEN REDUCTION INTERNAL FIXATION (ORIF) DISTAL RADIAL FRACTURE;  Surgeon: Iran Planas, MD;  Location: [place];  Service: Orthopedics;  Laterality: Right;  . PERCUTANEOUS CORONARY INTERVENTION-BALLOON ONLY  12/22/2011   Procedure: PERCUTANEOUS CORONARY INTERVENTION-BALLOON ONLY;  Surgeon: Troy Sine, MD;  Location: [facility] CATH LAB;  Service: Cardiovascular;;  . PERCUTANEOUS CORONARY INTERVENTION-BALLOON ONLY  11/14/2012   Procedure: PERCUTANEOUS CORONARY INTERVENTION-BALLOON ONLY;  Surgeon: Troy Sine, MD;  Location: [facility] CATH LAB;  Service: Cardiovascular;;  . PERCUTANEOUS CORONARY STENT INTERVENTION (PCI-S)  05/16/2012   Procedure: PERCUTANEOUS CORONARY STENT INTERVENTION (PCI-S);  Surgeon: Lorretta Harp, MD;  Location: [facility] CATH LAB;  Service: Cardiovascular;;  . PERCUTANEOUS CORONARY STENT INTERVENTION (PCI-S)  09/01/2013   Procedure: PERCUTANEOUS CORONARY STENT INTERVENTION (PCI-S);   Surgeon: Leonie Man, MD;  Location: [facility] CATH LAB;  Service: Cardiovascular;;    Current Outpatient Prescriptions  Medication Sig Dispense Refill  . acarbose (PRECOSE) 50 MG tablet Take 50 mg by mouth 3 (three) times daily with meals.     [name] acetaminophen (TYLENOL) 650 MG CR tablet Take 650 mg by mouth every 8 (eight) hours as needed for pain.    [name] ALPRAZolam (XANAX) 0.5 MG tablet Take 0.5 mg by mouth 3 (three) times daily as needed for anxiety.     . AMBULATORY NON FORMULARY MEDICATION Take 600 mg by mouth daily at 12 noon. Medication Name: Dalcetrapib vs placebo (Patient taking differently: Take 600 mg by mouth every evening. Medication Name: Dalcetrapib vs placebo: Cholesterol)    . aspirin EC 81 MG EC tablet Take 1 tablet (81 mg total) by mouth daily.    [name] atorvastatin (LIPITOR) 80 MG tablet TAKE 1 TABLET DAILY AT 6 PM. (Patient taking differently: Take 80 mg by mouth in the evening) 90 tablet 1  . fish oil-omega-3 fatty acids 1000 MG capsule Take 1 g by mouth daily.     . folic acid (FOLVITE) 1 MG tablet Take 1 mg  by mouth daily.     . furosemide (LASIX) 20 MG tablet Take 20 mg by mouth daily.     [name] gabapentin (NEURONTIN) 300 MG capsule Take 300 mg by mouth 3 (three) times daily as needed (for pain).     [name] glipiZIDE (GLUCOTROL) 10 MG tablet Take 10 mg by mouth 2 (two) times daily.    . Insulin Glargine (LANTUS SOLOSTAR) 100 UNIT/ML Solostar Pen Inject 80 Units into the skin daily at 10 pm.    . isosorbide mononitrate (IMDUR) 120 MG 24 hr tablet TAKE 1 TABLET (120 MG TOTAL) BY MOUTH DAILY. 90 tablet 1  . loratadine (CLARITIN) 10 MG tablet Take 1 tablet (10 mg total) by mouth daily. 30 tablet 0  . losartan (COZAAR) 25 MG tablet Take 0.5 tablets (12.5 mg total) by mouth daily. 30 tablet 6  . magnesium oxide (MAG-OX) 400 MG tablet Take 400 mg by mouth 2 (two) times daily.    . metFORMIN (GLUCOPHAGE) 1000 MG tablet Take 1 tablet (1,000 mg total) by mouth 2 (two) times daily with a meal.    .  methotrexate 50 MG/2ML injection Inject 25 mg into the vein every Thursday.    . metoprolol tartrate (LOPRESSOR) 25 MG tablet Take 1.5 tablets (37.5 mg total) by mouth 2 (two) times daily. 60 tablet 3  . nitroGLYCERIN (NITROSTAT) 0.4 MG SL tablet Place 1 tablet (0.4 mg total) under the tongue every 5 (five) minutes x 3 doses as needed for chest pain. 25 tablet 2  . oxyCODONE (OXY IR/ROXICODONE) 5 MG immediate release tablet Take 1 tablet (5 mg total) by mouth every 4 (four) hours as needed for moderate pain. 12 tablet 0  . primidone (MYSOLINE) 50 MG tablet Take 100 mg by mouth daily.     . Tocilizumab 162 MG/0.9ML SOSY Inject 162 mg into the skin every Monday. ACTEMRA     No current facility-administered medications for this visit.     Allergies:   Ivp dye [iodinated diagnostic agents] and Plavix [clopidogrel bisulfate]    Social History:  The patient  reports that he has been smoking Cigarettes.  He has been smoking about 0.50 packs per day. He has never used smokeless tobacco. He reports that he does not drink alcohol or use drugs.   Family History:  The patient's family history includes Cancer in his mother; Colon cancer in his father; Heart disease in his mother; Hypertension in his mother.    ROS:  Please see the history of present illness. All other systems are reviewed and negative.    PHYSICAL EXAM: VS:  BP (!) 146/80 (BP Location: Left Arm, Patient Position: Sitting, Cuff Size: Normal)   Pulse 81   Ht 6' (1.829 m)   Wt 242 lb 12.8 oz (110.1 kg)   BMI 32.93 kg/m  , BMI Body mass index is 32.93 kg/m. GEN: Well nourished, well developed, male in no acute distress  HEENT: normal for age  Neck: minimal JVD, no carotid bruit, no masses Cardiac: RRR; no murmur, no rubs, or gallops Respiratory:  clear to auscultation bilaterally, normal work of breathing GI: soft, nontender, nondistended, + BS MS: no deformity or atrophy; no edema; distal pulses are 2+ in all 4 extremities     Skin: warm and dry, no rash. Chronic discoloration of his left lower extremity ever since he fell and had a fracture that was associated with lower extremity edema and then got cellulitis. Neuro:  Strength and sensation are intact Psych: euthymic mood,  full affect   EKG:  EKG is ordered today. ECG shows sinus rhythm, heart rate 81, inferior Q waves are unchanged. No new acute ischemic changes, normal intervals  ECHO: 06/07/2017 - Left ventricle: The cavity size was mildly dilated. Wall   thickness was increased in a pattern of mild LVH. Anterolateral   severe hypokinesis. Basal to mid inferolateral akinesis. Basal   inferior akinesis. Systolic function was mildly to moderately   reduced. The estimated ejection fraction was in the range of 40%   to 45%. Doppler parameters are consistent with abnormal left   ventricular relaxation (grade 1 diastolic dysfunction). - Aortic valve: There was no stenosis. - Mitral valve: Mildly calcified annulus. There was trivial   regurgitation. - Left atrium: The atrium was mildly dilated. - Right ventricle: The cavity size was normal. Systolic function   was normal. - Pulmonary arteries: No complete TR doppler jet so unable to   estimate PA systolic pressure. - Inferior vena cava: The vessel was normal in size. The   respirophasic diameter changes were in the normal range (>= 50%),   consistent with normal central venous pressure.  Impressions:  - Mildly dilated LV with mild LV hypertrophy. Wall motion   abnormalities as noted above. EF 40-45%. Normal RV size and   systolic function. No significant valvular abnormalities.  CATH: 06/06/2017  Acute infero-lateral ST elevation myocardial infarction due to late repeat stent thrombosis involving the circumflex coronary artery. The circumflex has multiple overlapping stents including a proximal bare-metal stent and overlapping Cypher stent and 3 additional second generation drug-eluting stents placed  over the past 10 years. Each presentation in the right coronary has been an acute infarct. The last episode was 2016.  Successful angioplasty with restoration of antegrade TIMI grade 3 flow and reducing the 100% thrombotic stenosis to 50%. Repeat stenting was not performed.  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 The distal right coronary fills by collaterals from the LAD.  The LAD is widely patent with 60% segmental proximal to mid stenosis. The first agonal contains 60% stenosis.  Elevated left ventricular end-diastolic pressure, 19 mmHg.  RECOMMENDATIONS:   Discuss whether the patient is a surgical candidate for management of chronic total RCA and the circumflex.  P2 Y 12 therapy was not administered.  Continue IV Aggrastat for at least 24 hours.  IV Lasix given to treat dyspnea that developed during the procedure.  IV nitroglycerin  Beta blocker therapy  High intensity statin therapy  Risk factor modification: Continues to smoke cigarettes Post-Intervention Diagram         Recent Labs: 03/05/2017: TSH 2.904 06/07/2017: ALT 30 06/09/2017: BUN 15; Creatinine, Ser 0.72; Potassium 3.9; Sodium 135 06/11/2017: Hemoglobin 13.7; Platelets 58    Lipid Panel    Component Value Date/Time   CHOL 85 06/07/2017 0002   TRIG 142 06/07/2017 0002   HDL 29 (L) 06/07/2017 0002   CHOLHDL 2.9 06/07/2017 0002   VLDL 28 06/07/2017 0002   LDLCALC 28 06/07/2017 0002     Wt Readings from Last 3 Encounters:  06/20/17 242 lb 12.8 oz (110.1 kg)  06/11/17 243 lb 1.6 oz (110.3 kg)  05/15/17 246 lb (111.6 kg)     Other studies Reviewed: Additional studies/ records that were reviewed today include: office notes, hospital records and testing.  ASSESSMENT AND PLAN:  1.  S/p STEMI w/ PTCA CFX and CTO RCA: He has not had any anginal symptoms since discharge. He is on aspirin, high-dose statin, beta  blocker, and ARB. The beta blocker and ARB are at low doses. He is  not on dual antiplatelet therapy because of thrombocytopenia. For the CTO of the RCA, continue the Imdur at current dose 120 mg daily. He is encouraged to increase his activity as tolerated.   2. Hypertension: His blood pressure is elevated today but he has not taken his medications. He is encouraged to check his blood pressure after he takes his meds and let us know if he runs more than 130/80.  3. Ischemic cardiomyopathy: His EF was 40% recent cath. He is on appropriate medications. I explained to he and his wife that I would like to go up on the beta blocker or the losartan, but his blood pressure in the hospital was too low. They're encouraged to check his blood pressure and let us know if it runs high enough that we could increase the losartan or the metoprolol.  4. Thrombocytopenia: His platelets were 109 on admission, and dropped to 36 during his stay. They had come back up to 58 by discharge. He is not having any bleeding issues. Follow-up with PCP.   Current medicines are reviewed at length with the patient today.  The patient does not have concerns regarding medicines.  The following changes have been made:  no change  Labs/ tests ordered today include:   Orders Placed This Encounter  Procedures  . EKG 12-Lead     Disposition:   FU with Dr Sallyanne Kuster  Signed, Rosaria Ferries, PA-C  06/20/2017 11:16 AM    [place] Phone: [phone]; Fax: [phone]  This note was written with the assistance of speech recognition software. Please excuse any transcriptional errors.

## 2017-07-13 DIAGNOSIS — E669 Obesity, unspecified: Secondary | ICD-10-CM | POA: Diagnosis not present

## 2017-07-13 DIAGNOSIS — I2510 Atherosclerotic heart disease of native coronary artery without angina pectoris: Secondary | ICD-10-CM | POA: Diagnosis not present

## 2017-07-13 DIAGNOSIS — G894 Chronic pain syndrome: Secondary | ICD-10-CM | POA: Diagnosis not present

## 2017-07-13 DIAGNOSIS — M0689 Other specified rheumatoid arthritis, multiple sites: Secondary | ICD-10-CM | POA: Diagnosis not present

## 2017-07-13 DIAGNOSIS — I259 Chronic ischemic heart disease, unspecified: Secondary | ICD-10-CM | POA: Diagnosis not present

## 2017-07-13 DIAGNOSIS — Z6833 Body mass index (BMI) 33.0-33.9, adult: Secondary | ICD-10-CM | POA: Diagnosis not present

## 2017-07-13 DIAGNOSIS — J449 Chronic obstructive pulmonary disease, unspecified: Secondary | ICD-10-CM | POA: Diagnosis not present

## 2017-07-13 DIAGNOSIS — I10 Essential (primary) hypertension: Secondary | ICD-10-CM | POA: Diagnosis not present

## 2017-07-13 DIAGNOSIS — H8113 Benign paroxysmal vertigo, bilateral: Secondary | ICD-10-CM | POA: Diagnosis not present

## 2017-07-18 DIAGNOSIS — E669 Obesity, unspecified: Secondary | ICD-10-CM | POA: Diagnosis not present

## 2017-07-18 DIAGNOSIS — Z6833 Body mass index (BMI) 33.0-33.9, adult: Secondary | ICD-10-CM | POA: Diagnosis not present

## 2017-07-18 DIAGNOSIS — Z79899 Other long term (current) drug therapy: Secondary | ICD-10-CM | POA: Diagnosis not present

## 2017-07-18 DIAGNOSIS — M5441 Lumbago with sciatica, right side: Secondary | ICD-10-CM | POA: Diagnosis not present

## 2017-07-18 DIAGNOSIS — M0579 Rheumatoid arthritis with rheumatoid factor of multiple sites without organ or systems involvement: Secondary | ICD-10-CM | POA: Diagnosis not present

## 2017-07-18 DIAGNOSIS — M2550 Pain in unspecified joint: Secondary | ICD-10-CM | POA: Diagnosis not present

## 2017-07-19 DIAGNOSIS — Z6833 Body mass index (BMI) 33.0-33.9, adult: Secondary | ICD-10-CM | POA: Diagnosis not present

## 2017-07-19 DIAGNOSIS — E6609 Other obesity due to excess calories: Secondary | ICD-10-CM | POA: Diagnosis not present

## 2017-07-19 DIAGNOSIS — E1140 Type 2 diabetes mellitus with diabetic neuropathy, unspecified: Secondary | ICD-10-CM | POA: Diagnosis not present

## 2017-07-19 DIAGNOSIS — S81802A Unspecified open wound, left lower leg, initial encounter: Secondary | ICD-10-CM | POA: Diagnosis not present

## 2017-09-03 ENCOUNTER — Other Ambulatory Visit: Payer: Self-pay | Admitting: Cardiovascular Disease

## 2017-09-10 DIAGNOSIS — IMO0001 Reserved for inherently not codable concepts without codable children: Secondary | ICD-10-CM | POA: Insufficient documentation

## 2017-09-10 DIAGNOSIS — E119 Type 2 diabetes mellitus without complications: Secondary | ICD-10-CM

## 2017-09-10 DIAGNOSIS — I255 Ischemic cardiomyopathy: Secondary | ICD-10-CM | POA: Insufficient documentation

## 2017-09-10 DIAGNOSIS — K08109 Complete loss of teeth, unspecified cause, unspecified class: Secondary | ICD-10-CM | POA: Insufficient documentation

## 2017-09-10 DIAGNOSIS — Z515 Encounter for palliative care: Secondary | ICD-10-CM | POA: Insufficient documentation

## 2017-09-10 DIAGNOSIS — I10 Essential (primary) hypertension: Secondary | ICD-10-CM | POA: Insufficient documentation

## 2017-09-10 DIAGNOSIS — F419 Anxiety disorder, unspecified: Secondary | ICD-10-CM | POA: Insufficient documentation

## 2017-09-10 DIAGNOSIS — K219 Gastro-esophageal reflux disease without esophagitis: Secondary | ICD-10-CM | POA: Insufficient documentation

## 2017-09-10 DIAGNOSIS — Z972 Presence of dental prosthetic device (complete) (partial): Secondary | ICD-10-CM

## 2017-09-10 DIAGNOSIS — I2510 Atherosclerotic heart disease of native coronary artery without angina pectoris: Secondary | ICD-10-CM | POA: Insufficient documentation

## 2017-09-10 DIAGNOSIS — Z794 Long term (current) use of insulin: Secondary | ICD-10-CM

## 2017-09-10 DIAGNOSIS — I252 Old myocardial infarction: Secondary | ICD-10-CM | POA: Insufficient documentation

## 2017-09-13 DIAGNOSIS — E119 Type 2 diabetes mellitus without complications: Secondary | ICD-10-CM | POA: Diagnosis not present

## 2017-09-13 DIAGNOSIS — M069 Rheumatoid arthritis, unspecified: Secondary | ICD-10-CM | POA: Diagnosis not present

## 2017-09-13 DIAGNOSIS — I10 Essential (primary) hypertension: Secondary | ICD-10-CM | POA: Diagnosis not present

## 2017-09-13 DIAGNOSIS — J449 Chronic obstructive pulmonary disease, unspecified: Secondary | ICD-10-CM | POA: Diagnosis not present

## 2017-09-13 DIAGNOSIS — Z1389 Encounter for screening for other disorder: Secondary | ICD-10-CM | POA: Diagnosis not present

## 2017-09-13 DIAGNOSIS — I5043 Acute on chronic combined systolic (congestive) and diastolic (congestive) heart failure: Secondary | ICD-10-CM | POA: Diagnosis not present

## 2017-09-13 DIAGNOSIS — E782 Mixed hyperlipidemia: Secondary | ICD-10-CM | POA: Diagnosis not present

## 2017-09-13 DIAGNOSIS — I259 Chronic ischemic heart disease, unspecified: Secondary | ICD-10-CM | POA: Diagnosis not present

## 2017-09-13 DIAGNOSIS — J069 Acute upper respiratory infection, unspecified: Secondary | ICD-10-CM | POA: Diagnosis not present

## 2017-09-13 DIAGNOSIS — Z6833 Body mass index (BMI) 33.0-33.9, adult: Secondary | ICD-10-CM | POA: Diagnosis not present

## 2017-09-20 DIAGNOSIS — E6609 Other obesity due to excess calories: Secondary | ICD-10-CM | POA: Diagnosis not present

## 2017-09-20 DIAGNOSIS — J309 Allergic rhinitis, unspecified: Secondary | ICD-10-CM | POA: Diagnosis not present

## 2017-09-20 DIAGNOSIS — Z6833 Body mass index (BMI) 33.0-33.9, adult: Secondary | ICD-10-CM | POA: Diagnosis not present

## 2017-09-25 ENCOUNTER — Encounter (INDEPENDENT_AMBULATORY_CARE_PROVIDER_SITE_OTHER): Payer: Self-pay

## 2017-09-25 ENCOUNTER — Encounter: Payer: Self-pay | Admitting: Cardiovascular Disease

## 2017-09-25 ENCOUNTER — Ambulatory Visit (INDEPENDENT_AMBULATORY_CARE_PROVIDER_SITE_OTHER): Payer: Medicare HMO | Admitting: Cardiovascular Disease

## 2017-09-25 VITALS — BP 118/62 | HR 77 | Ht 72.0 in | Wt 240.0 lb

## 2017-09-25 DIAGNOSIS — Z9861 Coronary angioplasty status: Secondary | ICD-10-CM | POA: Diagnosis not present

## 2017-09-25 DIAGNOSIS — I2510 Atherosclerotic heart disease of native coronary artery without angina pectoris: Secondary | ICD-10-CM

## 2017-09-25 DIAGNOSIS — M069 Rheumatoid arthritis, unspecified: Secondary | ICD-10-CM | POA: Diagnosis not present

## 2017-09-25 DIAGNOSIS — I10 Essential (primary) hypertension: Secondary | ICD-10-CM | POA: Diagnosis not present

## 2017-09-25 DIAGNOSIS — D696 Thrombocytopenia, unspecified: Secondary | ICD-10-CM | POA: Diagnosis not present

## 2017-09-25 DIAGNOSIS — E782 Mixed hyperlipidemia: Secondary | ICD-10-CM | POA: Diagnosis not present

## 2017-09-25 DIAGNOSIS — Z794 Long term (current) use of insulin: Secondary | ICD-10-CM

## 2017-09-25 DIAGNOSIS — IMO0001 Reserved for inherently not codable concepts without codable children: Secondary | ICD-10-CM

## 2017-09-25 DIAGNOSIS — I739 Peripheral vascular disease, unspecified: Secondary | ICD-10-CM | POA: Diagnosis not present

## 2017-09-25 DIAGNOSIS — J449 Chronic obstructive pulmonary disease, unspecified: Secondary | ICD-10-CM

## 2017-09-25 DIAGNOSIS — E119 Type 2 diabetes mellitus without complications: Secondary | ICD-10-CM | POA: Diagnosis not present

## 2017-09-25 DIAGNOSIS — I5042 Chronic combined systolic (congestive) and diastolic (congestive) heart failure: Secondary | ICD-10-CM

## 2017-09-25 NOTE — Progress Notes (Signed)
Cardiology Office Note    Date:  09/25/2017   ID:  Robert [name], DOB Feb 06, 1951, MRN [id number]  PCP:  Redmond School, MD  Cardiologist:   Sanda Klein, MD   Chief complaint: CAD   History of Present Illness:  Robert [name] is a 67 y.o. male with premature onset coronary artery disease and frequent problems with in-stent restenosis after multiple interventions in the left circumflex coronary artery, most recently presenting with late stent thrombosis in September 2018 with non-ST segment elevation myocardial infarction, treated with plain angioplasty without placement of new stent.  He is also known to have chronic total occlusion of the right coronary artery.  He has a 60% stenosis in the proximal LAD and a 60% stenosis in the ostial first diagonal artery.  Bypass surgery was considered during this most recent hospitalization.  It was felt that he would likely not benefit of this procedure since the distal right coronary artery was a poor target, the left circumflex coronary artery territory is mostly infarcted and the LAD stenosis is not critical.  In addition he developed severe thrombocytopenia during that hospital stay (platelets down to 38,000), possibly due to the use of Aggrastat.  By the time of hospital discharge his platelet count had recovered slightly to 58,000.  An additional disincentive to bypass surgery the fact that he has undergone left leg vein stripping, although he still appears to have adequate veins for bypass in his right lower extremity.  Left ventricular ejection fraction has dropped as a result of his most recent infarction, now down to 40-45%.  At the time of left heart catheterization LVEDP was mildly increased to 19 mmHg.  Additional medical problems include PAD, insulin requiring diabetes mellitus, dyslipidemia with very low HDL and rheumatoid arthritis.  He has been managed for years with methotrexate, which may also be partly responsible for tendency to  pancytopenia), but now is on Actemra monotherapy with excellent clinical response.  Current complaints include a persistent cough and dyspnea on exertion.  Recent CBC performed in [place] showed a normal white blood cell count unfortunately the platelets were not checked.  His hemoglobin is normal at 63.  He has not had fever, chills or pleurisy.  He denies lower extremity edema.  There is no clear evidence of orthopnea or PND.  His cough is not productive.  He denies intermittent claudication but is quite sedentary.  He has not had focal neurological complaints and denies syncope or palpitations.  His lipid profile showed an excellent reduction in LDL cholesterol at 31, but as before he has relatively poor HDL level at 33 and mild hypertriglyceridemia 199.  His diabetes control has deteriorated with a most recent hemoglobin A1c of 9.3%.  He has not restarted smoking, after quitting about a year ago.   summary of coronary interventions: -cath 2004 showed total occlusion of RCA and intermediate lesions LAD, high grade mid LCX treated with 4x18 mm BMS (Velocity).  - 2006 overlapping 3.5x23 mm Cypher inLCX  - 2007 new downstream stenosis at OM bifurcation treated with 3x18 mm Cypher stent  - 2008 acute stent thrombosis (allergic to plavix, temporarily stopped Ticlid) - thrombolysis/aspiration only  - April 2013 cutting balloon angioplasty for proximal LCX in stent restenosis (Dr. Claiborne Billings)  - September 2013 repeat restenosis treated with "stent in stent" Xience Expedition 3.25 x 23 mm (Dr. Gwenlyn Found).  - March 2014 left circumflex coronary artery PCI for restenosis, utilizing cutting balloon atherotomy/noncompliant balloon dilatation  - December 2014  Thrombotic occlusion of restenotic proximal left circumflex stent , new Promus Premier DES 3.5 mm x 24 mm; overlapping the proximal and distal stents -  May 2016 cutting balloon angioplasty for in-stent restenosis in the proximal left circumflex coronary  artery -  July 2016 non STEMI ,  coronary angiogram unchanged,no intervention performed -  September 2018 non-STEMI, acute late thrombosis of the left circumflex stent, treated with angioplasty alone (Dr. Tamala Julian) -  No change in RCA or LAD anatomy during last several years.  -  Allergic to iodinated contrast, but did well with premedication. Allergic to clopidogrel but tolerates Brilinta and Effient well.   Past Medical History:  Diagnosis Date  . Anxiety   . COPD (chronic obstructive pulmonary disease) ([place])   . Coronary artery disease   . Full dentures   . GERD (gastroesophageal reflux disease)    Rolaids as needed  . High cholesterol   . History of MI (myocardial infarction)   . Hypertension    med. dosage increased 04/2016; has been on BP med. x 10 yrs.  . Incarcerated umbilical hernia 57/3220  . Inguinal hernia 05/2016   bilateral   . Insulin dependent diabetes mellitus ([place])   . Ischemic cardiomyopathy    a. 03/2015 EF 40-45% by LV gram.  . Left leg cellulitis 10/08/2016  . Rheumatoid arthritis(714.0)     Past Surgical History:  Procedure Laterality Date  . CARDIAC CATHETERIZATION N/A 02/01/2015   Procedure: Left Heart Cath and Coronary Angiography;  Surgeon: Belva Crome, MD;  Location: [place] [facility];  Service: Cardiovascular;  Laterality: N/A;  . CARDIAC CATHETERIZATION N/A 03/22/2015   Procedure: Left Heart Cath and Coronary Angiography;  Surgeon: Leonie Man, MD;  Location: [place] [facility];  Service: Cardiovascular;  Laterality: N/A;  . CARDIAC CATHETERIZATION  09/05/2002; 03/09/2003; 04/10/2005;06/02/2005; 05/09/2006; 02/09/2007; 03/01/2007  . CORONARY ANGIOPLASTY  11/14/2012; 02/01/2015  . CORONARY ANGIOPLASTY WITH STENT PLACEMENT  05/16/2012   "1; makes total ~ 4"  . CORONARY STENT INTERVENTION N/A 06/06/2017   Procedure: CORONARY STENT INTERVENTION;  Surgeon: Belva Crome, MD;  Location: [place] [facility];  Service: Cardiovascular;  Laterality: N/A;  .  INSERTION OF MESH Bilateral 05/24/2016   Procedure: INSERTION OF MESH;  Surgeon: Coralie Keens, MD;  Location: [place];  Service: General;  Laterality: Bilateral;  . LAPAROSCOPIC CHOLECYSTECTOMY    . LAPAROSCOPIC INGUINAL HERNIA WITH UMBILICAL HERNIA Bilateral 05/24/2016   Procedure: BILATERAL LAPAROSCOPIC INGUINAL HERNIA REPAIR WITH MESH AND UMBILICAL HERNIA REPAIR WITH MESH;  Surgeon: Coralie Keens, MD;  Location: [facility];  Service: General;  Laterality: Bilateral;  . LEFT HEART CATH AND CORONARY ANGIOGRAPHY N/A 06/06/2017   Procedure: LEFT HEART CATH AND CORONARY ANGIOGRAPHY;  Surgeon: Belva Crome, MD;  Location: [place] [facility];  Service: Cardiovascular;  Laterality: N/A;  . LEFT HEART CATHETERIZATION WITH CORONARY ANGIOGRAM N/A 12/22/2011   Procedure: LEFT HEART CATHETERIZATION WITH CORONARY ANGIOGRAM;  Surgeon: Troy Sine, MD;  Location: [facility] C[facility] LAB;  Service: Cardiovascular;  Laterality: N/A;  . LEFT HEART CATHETERIZATION WITH CORONARY ANGIOGRAM Bilateral 05/16/2012   Procedure: LEFT HEART CATHETERIZATION WITH CORONARY ANGIOGRAM;  Surgeon: Lorretta Harp, MD;  Location: [facility] CATH LAB;  Service: Cardiovascular;  Laterality: Bilateral;  . LEFT HEART CATHETERIZATION WITH CORONARY ANGIOGRAM N/A 11/14/2012   Procedure: LEFT HEART CATHETERIZATION WITH CORONARY ANGIOGRAM;  Surgeon: Troy Sine, MD;  Location: [facility] CATH LAB;  Service: Cardiovascular;  Laterality: N/A;  . LEFT  HEART CATHETERIZATION WITH CORONARY ANGIOGRAM N/A 09/01/2013   Procedure: LEFT HEART CATHETERIZATION WITH CORONARY ANGIOGRAM;  Surgeon: Leonie Man, MD;  Location: [facility] CATH LAB;  Service: Cardiovascular;  Laterality: N/A;  . [place]; 1973; 1985   x 3  . NM MYOCAR PERF WALL MOTION  08/30/2009   No significant ischemia  . OPEN REDUCTION INTERNAL FIXATION (ORIF) DISTAL RADIAL FRACTURE Right 12/10/2016   Procedure: OPEN REDUCTION INTERNAL FIXATION (ORIF) DISTAL RADIAL  FRACTURE;  Surgeon: Iran Planas, MD;  Location: [place];  Service: Orthopedics;  Laterality: Right;  . PERCUTANEOUS CORONARY INTERVENTION-BALLOON ONLY  12/22/2011   Procedure: PERCUTANEOUS CORONARY INTERVENTION-BALLOON ONLY;  Surgeon: Troy Sine, MD;  Location: [facility] CATH LAB;  Service: Cardiovascular;;  . PERCUTANEOUS CORONARY INTERVENTION-BALLOON ONLY  11/14/2012   Procedure: PERCUTANEOUS CORONARY INTERVENTION-BALLOON ONLY;  Surgeon: Troy Sine, MD;  Location: [facility] CATH LAB;  Service: Cardiovascular;;  . PERCUTANEOUS CORONARY STENT INTERVENTION (PCI-S)  05/16/2012   Procedure: PERCUTANEOUS CORONARY STENT INTERVENTION (PCI-S);  Surgeon: Lorretta Harp, MD;  Location: [facility] CATH LAB;  Service: Cardiovascular;;  . PERCUTANEOUS CORONARY STENT INTERVENTION (PCI-S)  09/01/2013   Procedure: PERCUTANEOUS CORONARY STENT INTERVENTION (PCI-S);  Surgeon: Leonie Man, MD;  Location: [facility] CATH LAB;  Service: Cardiovascular;;    Current Medications: Outpatient Medications Prior to Visit  Medication Sig Dispense Refill  . acarbose (PRECOSE) 50 MG tablet Take 50 mg by mouth 3 (three) times daily with meals.     [name] acetaminophen (TYLENOL) 650 MG CR tablet Take 650 mg by mouth every 8 (eight) hours as needed for pain.    [name] ALPRAZolam (XANAX) 0.5 MG tablet Take 0.5 mg by mouth 3 (three) times daily as needed for anxiety.     . AMBULATORY NON FORMULARY MEDICATION Take 600 mg by mouth daily at 12 noon. Medication Name: Dalcetrapib vs placebo (Patient taking differently: Take 600 mg by mouth every evening. Medication Name: Dalcetrapib vs placebo: Cholesterol)    . aspirin EC 81 MG EC tablet Take 1 tablet (81 mg total) by mouth daily.    [name] atorvastatin (LIPITOR) 80 MG tablet TAKE 1 TABLET DAILY AT 6 PM. (Patient taking differently: Take 80 mg by mouth in the evening) 90 tablet 1  . fish oil-omega-3 fatty acids 1000 MG capsule Take 1 g by mouth daily.     . folic acid (FOLVITE) 1 MG tablet Take 1 mg by mouth daily.       . furosemide (LASIX) 20 MG tablet Take 20 mg by mouth 3 (three) times daily.     [name] gabapentin (NEURONTIN) 300 MG capsule Take 300 mg by mouth 3 (three) times daily as needed (for pain).     [name] glipiZIDE (GLUCOTROL) 10 MG tablet Take 10 mg by mouth 2 (two) times daily.    . Insulin Glargine (LANTUS SOLOSTAR) 100 UNIT/ML Solostar Pen Inject 80 Units into the skin daily at 10 pm.    . isosorbide mononitrate (IMDUR) 120 MG 24 hr tablet TAKE 1 TABLET EVERY DAY 90 tablet 0  . loratadine (CLARITIN) 10 MG tablet Take 1 tablet (10 mg total) by mouth daily. 30 tablet 0  . losartan (COZAAR) 25 MG tablet Take 0.5 tablets (12.5 mg total) by mouth daily. 30 tablet 6  . magnesium oxide (MAG-OX) 400 MG tablet Take 400 mg by mouth 2 (two) times daily.    . metFORMIN (GLUCOPHAGE) 1000 MG tablet Take 1 tablet (1,000 mg total) by mouth 2 (two) times daily with a  meal.    . methotrexate 50 MG/2ML injection Inject 25 mg into the vein every Thursday.    . metoprolol tartrate (LOPRESSOR) 25 MG tablet Take 1.5 tablets (37.5 mg total) by mouth 2 (two) times daily. 60 tablet 3  . nitroGLYCERIN (NITROSTAT) 0.4 MG SL tablet Place 1 tablet (0.4 mg total) under the tongue every 5 (five) minutes x 3 doses as needed for chest pain. 25 tablet 2  . oxyCODONE (OXY IR/ROXICODONE) 5 MG immediate release tablet Take 1 tablet (5 mg total) by mouth every 4 (four) hours as needed for moderate pain. 12 tablet 0  . primidone (MYSOLINE) 50 MG tablet Take 100 mg by mouth daily.     . Tocilizumab 162 MG/0.9ML SOSY Inject 162 mg into the skin every Monday. ACTEMRA     No facility-administered medications prior to visit.      Allergies:   Ivp dye [iodinated diagnostic agents] and Plavix [clopidogrel bisulfate]   Social History   Socioeconomic History  . Marital status: Married    Spouse name: None  . Number of children: None  . Years of education: None  . Highest education level: None  Social Needs  . Financial resource strain: None   . Food insecurity - worry: None  . Food insecurity - inability: None  . Transportation needs - medical: None  . Transportation needs - non-medical: None  Occupational History  . Occupation: Clinical biochemist  Tobacco Use  . Smoking status: Current Some Day Smoker    Packs/day: 0.50    Types: Cigarettes    Last attempt to quit: 06/11/2015    Years since quitting: 2.2  . Smokeless tobacco: Never Used  . Tobacco comment: pt states smoking less than half a pack  Substance and Sexual Activity  . Alcohol use: No  . Drug use: No  . Sexual activity: Yes  Other Topics Concern  . None  Social History Narrative  . None     Family History:  The patient's family history includes Cancer in his mother; Colon cancer in his father; Heart disease in his mother; Hypertension in his mother.   ROS:   Please see the history of present illness.    ROS All other systems reviewed and are negative.   PHYSICAL EXAM:   VS:  BP 118/62   Pulse 77   Ht 6' (1.829 m)   Wt 240 lb (108.9 kg)   BMI 32.55 kg/m      General: Alert, oriented x3, no distress, mildly obese Head: no evidence of trauma, PERRL, EOMI, no exophtalmos or lid lag, no myxedema, no xanthelasma; normal ears, nose and oropharynx Neck: normal jugular venous pulsations and no hepatojugular reflux; brisk carotid pulses without delay and no carotid bruits Chest: clear to auscultation, no signs of consolidation by percussion or palpation, normal fremitus, symmetrical and full respiratory excursions Cardiovascular: normal position and quality of the apical impulse, regular rhythm, normal first and second heart sounds, no murmurs, rubs or gallops Abdomen: no tenderness or distention, no masses by palpation, no abnormal pulsatility or arterial bruits, normal bowel sounds, no hepatosplenomegaly Extremities: no clubbing, cyanosis or edema; 2+ radial, ulnar and brachial pulses bilaterally; 2+ right femoral, posterior tibial and dorsalis  pedis pulses; 2+ left femoral, posterior tibial and dorsalis pedis pulses; no subclavian or femoral bruits Neurological: grossly nonfocal Psych: Normal mood and affect   Wt Readings from Last 3 Encounters:  09/25/17 240 lb (108.9 kg)  06/20/17 242 lb 12.8 oz (110.1 kg)  06/11/17 243 lb 1.6 oz (110.3 kg)      Studies/Labs Reviewed:   EKG:  EKG is ordered today.  The ekg ordered today demonstrates Sinus rhythm with a Couple of PVCs, old inferior wall MI, no acute repolarization changes, QTC 435 ms  Recent Labs: 03/05/2017: TSH 2.904 06/07/2017: ALT 30 06/09/2017: BUN 15; Creatinine, Ser 0.72; Potassium 3.9; Sodium 135 06/11/2017: Hemoglobin 13.7; Platelets 58   Lipid Panel    Component Value Date/Time   CHOL 85 06/07/2017 0002   TRIG 142 06/07/2017 0002   HDL 29 (L) 06/07/2017 0002   CHOLHDL 2.9 06/07/2017 0002   VLDL 28 06/07/2017 0002   LDLCALC 28 06/07/2017 0002   April 2016 total cholesterol 121, HDL 23, LDL 28, triglycerides 595. Hemoglobin A1c 9.6% on 10/08/2016, creatinine 0.97  ASSESSMENT:    1. CAD S/P percutaneous coronary angioplasty   2. Chronic combined systolic and diastolic CHF (congestive heart failure) ([place])   3. PVD, chronic LLE   4. Essential hypertension   5. Chronic obstructive pulmonary disease, unspecified COPD type ([place])   6. Mixed hyperlipidemia   7. Insulin dependent diabetes mellitus ([place])   8. Thrombocytopenia ([place])   9. Rheumatoid arthritis involving multiple sites, unspecified rheumatoid factor presence ([facility])      PLAN:  In order of problems listed above:  1. CAD: He does not have angina pectoris.  He has probably infarcted most of the posterior wall.  We discussed the disadvantages of performing open heart surgery due to the low yield, likely low chance of revascularizing the right coronary artery and the risk of a atresia of the mammary artery if it is placed to a noncritically occluded LAD.  Continue with medical therapy.  Recheck his  platelet count and resume Brilinta if his platelets have normalized.  (History of rash with Plavix). 2. CHF: He does not have any overt evidence of hypervolemia, but his EF has fallen and he did have a mildly elevated EDP at the time of heart catheterization.  It is possible that his worsening dyspnea and his cough might be related to heart failure.  Will check BMP today. 3. PAD: Currently does not appear to have intermittent claudication.  He has known chronic occlusion of the left popliteal artery with collateral formation and three-vessel runoff. This was evaluated by one of our vascular specialist and felt to be best suited for medical therapy. Congratulated on smoking cessation. Recently performed bilateral carotid artery duplex ultrasound was normal.. 4. HTN: well controlled 5. COPD: I suspect this is the most important contributor to his exertional dyspnea, but cannot exclude contribution of heart failure.  Not wheezing today. 6. HLP: Excellent LDL, needs better glucose control and weight loss to help HDL and TG levels. 7. DM: Complicated by neuropathy and peripheral angiopathy, not well controlled. This puts him at risk for progression of coronary and peripheral artery disease. Consider use of SGLT2 inhibitors for improved CV outcomes. 8. RA: on actemra, MTX may have been partly responsible for thrombocytopenia. 9. Thrombocytopenia: Recheck labs today   Medication Adjustments/Labs and Tests Ordered: Current medicines are reviewed at length with the patient today.  Concerns regarding medicines are outlined above.  Medication changes, Labs and Tests ordered today are listed in the Patient Instructions below. There are no Patient Instructions on file for this visit.   Signed, Sanda Klein, MD  09/25/2017 10:22 AM    [facility] [address], [place], [place]  [address] Phone: [phone]; Fax: [phone]   

## 2017-09-25 NOTE — Patient Instructions (Signed)
Dr Sallyanne Kuster recommends that you continue on your current medications as directed. Please refer to the Current Medication list given to you today.  Your physician recommends that you return for lab work TODAY.  Dr Sallyanne Kuster recommends that you schedule a follow-up appointment in 3 months.  If you need a refill on your cardiac medications before your next appointment, please call your pharmacy.

## 2017-09-26 LAB — CBC
HEMATOCRIT: 47.1 % (ref 37.5–51.0)
HEMOGLOBIN: 16.3 g/dL (ref 13.0–17.7)
MCH: 31.6 pg (ref 26.6–33.0)
MCHC: 34.6 g/dL (ref 31.5–35.7)
MCV: 91 fL (ref 79–97)
Platelets: 159 10*3/uL (ref 150–379)
RBC: 5.16 x10E6/uL (ref 4.14–5.80)
RDW: 14.5 % (ref 12.3–15.4)
WBC: 10.2 10*3/uL (ref 3.4–10.8)

## 2017-09-26 LAB — PRO B NATRIURETIC PEPTIDE: NT-Pro BNP: 396 pg/mL — ABNORMAL HIGH (ref 0–210)

## 2017-09-26 LAB — BASIC METABOLIC PANEL
BUN/Creatinine Ratio: 18 (ref 10–24)
BUN: 17 mg/dL (ref 8–27)
CALCIUM: 9.5 mg/dL (ref 8.6–10.2)
CHLORIDE: 96 mmol/L (ref 96–106)
CO2: 25 mmol/L (ref 20–29)
Creatinine, Ser: 0.95 mg/dL (ref 0.76–1.27)
GFR calc non Af Amer: 84 mL/min/{1.73_m2} (ref >59)
GFR, EST AFRICAN AMERICAN: 97 mL/min/{1.73_m2} (ref >59)
Glucose: 232 mg/dL — ABNORMAL HIGH (ref 65–99)
Potassium: 4.6 mmol/L (ref 3.5–5.2)
Sodium: 137 mmol/L (ref 134–144)

## 2017-09-28 ENCOUNTER — Telehealth: Payer: Self-pay | Admitting: Cardiovascular Disease

## 2017-09-28 MED ORDER — FUROSEMIDE 40 MG PO TABS: 80.0000 mg | ORAL_TABLET | Freq: Every day | ORAL | 3 refills | Status: DC

## 2017-09-28 MED ORDER — TICAGRELOR 90 MG PO TABS: 90.0000 mg | ORAL_TABLET | Freq: Two times a day (BID) | ORAL | Status: DC

## 2017-09-28 NOTE — Telephone Encounter (Signed)
-----   Message from Sanda Klein, MD sent at 09/26/2017  8:20 AM EST ----- BNP is mildly elevated, although it is hard to know what his baseline is at this time.  It is possible he has some extra fluid on board causing his cough and shortness of breath.  I would suggest increasing the furosemide to a total of 80 mg daily.

## 2017-09-28 NOTE — Telephone Encounter (Signed)
New Message ° ° ° °Patient is returning call in reference to labs. Please call.  °

## 2017-09-28 NOTE — Telephone Encounter (Signed)
Spoke with pt, aware to increase furosemide, New script sent to the pharmacy. Patient also aware to restart brilinta 90 mg bid, samples, savings card and patient assistance form placed at the front desk for pick up.

## 2017-10-05 ENCOUNTER — Telehealth: Payer: Self-pay | Admitting: Cardiovascular Disease

## 2017-10-05 DIAGNOSIS — Z79899 Other long term (current) drug therapy: Secondary | ICD-10-CM

## 2017-10-05 MED ORDER — POTASSIUM CHLORIDE CRYS ER 20 MEQ PO TBCR: EXTENDED_RELEASE_TABLET | ORAL | 5 refills | Status: DC

## 2017-10-05 NOTE — Telephone Encounter (Signed)
Returned call to patient of Dr. Loletha Grayer who is c/o cramping in his arms/sides since increasing lasix on 1/18. He is taking magnesium 800mg  OTC - helps w/symptoms some. His cramping is throughout the day. Suggested bananas, mustard vinegar for quick relief but his symptoms are throughout the day.  He reports he is doing [facility], dispensing since beginning increased lasix dose of 80mg  QD  Will route to MD for recommendations

## 2017-10-05 NOTE — Telephone Encounter (Signed)
Please prescribe KCl 20 mEq daily. Have him take 2 x 20 mEq today, then once a day. Check BMET in 1 week Thanks MCr

## 2017-10-05 NOTE — Telephone Encounter (Signed)
Patient aware of MD recommendations. Rx(s) sent to pharmacy electronically. He will use [facility] in [place] for BMET on Feb 1 or Feb 4  Routed to [place], [place] as FYI

## 2017-10-05 NOTE — Telephone Encounter (Signed)
New Message   Pt c/o medication issue:  1. Name of Medication: furosemide 2. How are you currently taking this medication (dosage and times per day)? 80mg    3. Are you having a reaction (difficulty breathing--STAT)?cramps    4. What is your medication issue? Patient states that the lasix is causing him to have terrible cramps. He wants to know can any medication be called in that will help with the cramps.

## 2017-10-16 DIAGNOSIS — Z6832 Body mass index (BMI) 32.0-32.9, adult: Secondary | ICD-10-CM | POA: Diagnosis not present

## 2017-10-16 DIAGNOSIS — I10 Essential (primary) hypertension: Secondary | ICD-10-CM | POA: Diagnosis not present

## 2017-10-16 DIAGNOSIS — M0689 Other specified rheumatoid arthritis, multiple sites: Secondary | ICD-10-CM | POA: Diagnosis not present

## 2017-10-16 DIAGNOSIS — B353 Tinea pedis: Secondary | ICD-10-CM | POA: Diagnosis not present

## 2017-10-16 DIAGNOSIS — E6609 Other obesity due to excess calories: Secondary | ICD-10-CM | POA: Diagnosis not present

## 2017-10-16 DIAGNOSIS — I2510 Atherosclerotic heart disease of native coronary artery without angina pectoris: Secondary | ICD-10-CM | POA: Diagnosis not present

## 2017-10-16 DIAGNOSIS — E669 Obesity, unspecified: Secondary | ICD-10-CM | POA: Diagnosis not present

## 2017-10-16 DIAGNOSIS — E1140 Type 2 diabetes mellitus with diabetic neuropathy, unspecified: Secondary | ICD-10-CM | POA: Diagnosis not present

## 2017-10-16 DIAGNOSIS — R201 Hypoesthesia of skin: Secondary | ICD-10-CM | POA: Diagnosis not present

## 2017-10-18 DIAGNOSIS — Z6832 Body mass index (BMI) 32.0-32.9, adult: Secondary | ICD-10-CM | POA: Diagnosis not present

## 2017-10-18 DIAGNOSIS — M0579 Rheumatoid arthritis with rheumatoid factor of multiple sites without organ or systems involvement: Secondary | ICD-10-CM | POA: Diagnosis not present

## 2017-10-18 DIAGNOSIS — Z79899 Other long term (current) drug therapy: Secondary | ICD-10-CM | POA: Diagnosis not present

## 2017-10-18 DIAGNOSIS — M2550 Pain in unspecified joint: Secondary | ICD-10-CM | POA: Diagnosis not present

## 2017-10-18 DIAGNOSIS — M5441 Lumbago with sciatica, right side: Secondary | ICD-10-CM | POA: Diagnosis not present

## 2017-10-18 DIAGNOSIS — E669 Obesity, unspecified: Secondary | ICD-10-CM | POA: Diagnosis not present

## 2017-11-01 ENCOUNTER — Encounter (INDEPENDENT_AMBULATORY_CARE_PROVIDER_SITE_OTHER): Payer: Self-pay | Admitting: *Deleted

## 2017-11-05 ENCOUNTER — Other Ambulatory Visit: Payer: Self-pay | Admitting: Cardiovascular Disease

## 2017-11-07 ENCOUNTER — Encounter: Payer: Self-pay | Admitting: Internal Medicine

## 2017-11-14 DIAGNOSIS — Z0100 Encounter for examination of eyes and vision without abnormal findings: Secondary | ICD-10-CM | POA: Diagnosis not present

## 2017-11-14 DIAGNOSIS — I10 Essential (primary) hypertension: Secondary | ICD-10-CM | POA: Diagnosis not present

## 2017-11-14 DIAGNOSIS — E109 Type 1 diabetes mellitus without complications: Secondary | ICD-10-CM | POA: Diagnosis not present

## 2017-11-14 DIAGNOSIS — E1165 Type 2 diabetes mellitus with hyperglycemia: Secondary | ICD-10-CM | POA: Diagnosis not present

## 2017-11-23 ENCOUNTER — Other Ambulatory Visit: Payer: Self-pay | Admitting: Cardiovascular Disease

## 2017-12-20 ENCOUNTER — Other Ambulatory Visit: Payer: Self-pay | Admitting: Cardiovascular Disease

## 2017-12-21 NOTE — Telephone Encounter (Signed)
REFILL 

## 2017-12-31 ENCOUNTER — Ambulatory Visit: Payer: Medicare HMO | Admitting: Gastroenterology

## 2018-01-02 ENCOUNTER — Encounter: Payer: Self-pay | Admitting: Gastroenterology

## 2018-01-02 ENCOUNTER — Ambulatory Visit: Payer: Medicare HMO | Admitting: Gastroenterology

## 2018-01-02 DIAGNOSIS — R197 Diarrhea, unspecified: Secondary | ICD-10-CM

## 2018-01-02 DIAGNOSIS — Z800 Family history of malignant neoplasm of digestive organs: Secondary | ICD-10-CM | POA: Diagnosis not present

## 2018-01-02 DIAGNOSIS — R1310 Dysphagia, unspecified: Secondary | ICD-10-CM | POA: Diagnosis not present

## 2018-01-02 NOTE — Progress Notes (Signed)
Primary Care Physician:  Redmond School, MD  Primary Gastroenterologist:  Garfield Cornea, MD   Chief Complaint  Patient presents with  . Colonoscopy    consult; had MI 05/2017    HPI:  Robert [name] is a 67 y.o. male here at the request of Dr. Gerarda Fraction for consideration of screening colonoscopy. Patient was brought in for OV due to h/o recent MI in 05/2017. PMH CAD, DM, RA on chronic methotrexate in the past and now on Actemra.   He has h/o CAD complicated by in-stent restenosis after multiple interventions in the left circumflex coronary artery, most recently presenting with late stent thrombosis in 05/2017 with non-ST segment elevation MI, treated with angioplasty without placement of new stent. Bypass not felt to be beneficial in his case. He also developed severe thrombocytopenia (38,000) possibly due to Aggrastat. Last new stenting in 2014.   Patient has FH CRC, mother and brother (brother at age less than 45). Patient's last colonoscopy was in 2008. He reports three days a week, bad diarrhea. 3 imodium on those days. Present over the past six weeks. No cramps. No melena, brbpr. No fever. Nocturnal stools at times. On other days he has normal stool. He also has issues swallowing since he fell last March and had compound fx of vertebrae. Take med with applesauce. Choking with liquids, strangling. No solid food dysphagia. Wore a brace for three months from waist up to his ears. Wonders if he should have EGD. Also complains of hoarse, looses voice. No heartburn.   Review of careeverywhere: UGI SBFT 2017 with mild narrowing at GEJ, mild intermittent GERD. Esophageal dysmotility most c/w presbyesophagus.   He has been on Brilinta for three months. Prior to that he was on ASA. Allergic to plavis.   Patient is also in a clinical trial. He reports the medication is a Cholesterol reducing medication.   Current Outpatient Medications  Medication Sig Dispense Refill  . acarbose (PRECOSE) 50 MG tablet  Take 50 mg by mouth 3 (three) times daily with meals.     [name] acetaminophen (TYLENOL) 650 MG CR tablet Take 650 mg by mouth every 8 (eight) hours as needed for pain.    [name] ALPRAZolam (XANAX) 0.5 MG tablet Take 0.5 mg by mouth 3 (three) times daily as needed for anxiety.     . AMBULATORY NON FORMULARY MEDICATION Take 600 mg by mouth daily at 12 noon. Medication Name: Dalcetrapib vs placebo (Patient taking differently: Take 600 mg by mouth every evening. Medication Name: Dalcetrapib vs placebo: Cholesterol)    . aspirin EC 81 MG EC tablet Take 1 tablet (81 mg total) by mouth daily.    [name] atorvastatin (LIPITOR) 80 MG tablet TAKE 1 TABLET DAILY AT 6 PM. 90 tablet 3  . BRILINTA 90 MG TABS tablet TAKE (1) TABLET BY MOUTH TWICE DAILY. 120 tablet 0  . fish oil-omega-3 fatty acids 1000 MG capsule Take 1 g by mouth daily.     . folic acid (FOLVITE) 1 MG tablet Take 1 mg by mouth daily.     . furosemide (LASIX) 40 MG tablet Take 2 tablets (80 mg total) by mouth daily. 180 tablet 3  . gabapentin (NEURONTIN) 300 MG capsule Take 300 mg by mouth 3 (three) times daily as needed (for pain).     [name] glipiZIDE (GLUCOTROL) 10 MG tablet Take 10 mg by mouth 2 (two) times daily.    . Insulin Glargine (LANTUS SOLOSTAR) 100 UNIT/ML Solostar Pen Inject 80 Units into the skin  daily at 10 pm.    . isosorbide mononitrate (IMDUR) 120 MG 24 hr tablet Take 1 tablet (120 mg total) by mouth daily. 90 tablet 0  . loratadine (CLARITIN) 10 MG tablet Take 1 tablet (10 mg total) by mouth daily. 30 tablet 0  . losartan (COZAAR) 25 MG tablet Take 0.5 tablets (12.5 mg total) by mouth daily. 30 tablet 6  . magnesium oxide (MAG-OX) 400 MG tablet Take 400 mg by mouth 2 (two) times daily.    . metFORMIN (GLUCOPHAGE) 1000 MG tablet Take 1 tablet (1,000 mg total) by mouth 2 (two) times daily with a meal.    . methotrexate 50 MG/2ML injection Inject 2 mLs into the skin once a week. Takes every Thursday    . metoprolol tartrate (LOPRESSOR) 25 MG tablet  Take 1.5 tablets (37.5 mg total) by mouth 2 (two) times daily. 60 tablet 3  . nitroGLYCERIN (NITROSTAT) 0.4 MG SL tablet Place 1 tablet (0.4 mg total) under the tongue every 5 (five) minutes x 3 doses as needed for chest pain. 25 tablet 2  . oxyCODONE (OXY IR/ROXICODONE) 5 MG immediate release tablet Take 1 tablet (5 mg total) by mouth every 4 (four) hours as needed for moderate pain. 12 tablet 0  . potassium chloride SA (K-DUR,KLOR-CON) 20 MEQ tablet Take 2 tablets on day one and then 1 tablet by mouth daily thereafter. (Patient taking differently: Take 2 tablets on day one and then 1 tablet by mouth daily thereafter. Doesn't take everyday) 30 tablet 5  . primidone (MYSOLINE) 50 MG tablet Take 100 mg by mouth daily.     . Tocilizumab 162 MG/0.9ML SOSY Inject 162 mg into the skin every Monday. ACTEMRA     No current facility-administered medications for this visit.     Allergies as of 01/02/2018 - Review Complete 01/02/2018  Allergen Reaction Noted  . Ivp dye [iodinated diagnostic agents] Itching and Rash 08/27/2011  . Plavix [clopidogrel bisulfate] Rash 08/27/2011    Past Medical History:  Diagnosis Date  . Anxiety   . COPD (chronic obstructive pulmonary disease) ([place])   . Coronary artery disease   . Full dentures   . GERD (gastroesophageal reflux disease)    Rolaids as needed  . High cholesterol   . History of MI (myocardial infarction)   . Hypertension    med. dosage increased 04/2016; has been on BP med. x 10 yrs.  . Incarcerated umbilical hernia 78/2423  . Inguinal hernia 05/2016   bilateral   . Insulin dependent diabetes mellitus ([place])   . Ischemic cardiomyopathy    a. 03/2015 EF 40-45% by LV gram.  . Left leg cellulitis 10/08/2016  . Rheumatoid arthritis(714.0)     Past Surgical History:  Procedure Laterality Date  . CARDIAC CATHETERIZATION N/A 02/01/2015   Procedure: Left Heart Cath and Coronary Angiography;  Surgeon: Belva Crome, MD;  Location: [place] [facility];   Service: Cardiovascular;  Laterality: N/A;  . CARDIAC CATHETERIZATION N/A 03/22/2015   Procedure: Left Heart Cath and Coronary Angiography;  Surgeon: Leonie Man, MD;  Location: [place] [facility];  Service: Cardiovascular;  Laterality: N/A;  . CARDIAC CATHETERIZATION  09/05/2002; 03/09/2003; 04/10/2005;06/02/2005; 05/09/2006; 02/09/2007; 03/01/2007  . CORONARY ANGIOPLASTY  11/14/2012; 02/01/2015  . CORONARY ANGIOPLASTY WITH STENT PLACEMENT  05/16/2012   "1; makes total ~ 4"  . CORONARY STENT INTERVENTION N/A 06/06/2017   Procedure: CORONARY STENT INTERVENTION;  Surgeon: Belva Crome, MD;  Location: [place] [facility];  Service: Cardiovascular;  Laterality: N/A;  . INSERTION OF MESH Bilateral 05/24/2016   Procedure: INSERTION OF MESH;  Surgeon: Coralie Keens, MD;  Location: [address];  Service: General;  Laterality: Bilateral;  . LAPAROSCOPIC CHOLECYSTECTOMY    . LAPAROSCOPIC INGUINAL HERNIA WITH UMBILICAL HERNIA Bilateral 05/24/2016   Procedure: BILATERAL LAPAROSCOPIC INGUINAL HERNIA REPAIR WITH MESH AND UMBILICAL HERNIA REPAIR WITH MESH;  Surgeon: Coralie Keens, MD;  Location: [place];  Service: General;  Laterality: Bilateral;  . LEFT HEART CATH AND CORONARY ANGIOGRAPHY N/A 06/06/2017   Procedure: LEFT HEART CATH AND CORONARY ANGIOGRAPHY;  Surgeon: Belva Crome, MD;  Location: [place] [facility];  Service: Cardiovascular;  Laterality: N/A;  . LEFT HEART CATHETERIZATION WITH CORONARY ANGIOGRAM N/A 12/22/2011   Procedure: LEFT HEART CATHETERIZATION WITH CORONARY ANGIOGRAM;  Surgeon: Troy Sine, MD;  Location[facility] CATH LAB;  Service: Cardiovascular;  Laterality: N/A;  . LEFT HEART CATHETERIZATION WITH CORONARY ANGIOGRAM Bilateral 05/16/2012   Procedure: LEFT HEART CATHETERIZATION WITH CORONARY ANGIOGRAM;  Surgeon: Lorretta Harp, MD;  Location: [facility] CATH LAB;  Service: Cardiovascular;  Laterality: Bilateral;  . LEFT HEART CATHETERIZATION WITH CORONARY ANGIOGRAM  N/A 11/14/2012   Procedure: LEFT HEART CATHETERIZATION WITH CORONARY ANGIOGRAM;  Surgeon: Troy Sine, MD;  Location: [facility] CATH LAB;  Service: Cardiovascular;  Laterality: N/A;  . LEFT HEART CATHETERIZATION WITH CORONARY ANGIOGRAM N/A 09/01/2013   Procedure: LEFT HEART CATHETERIZATION WITH CORONARY ANGIOGRAM;  Surgeon: Leonie Man, MD;  Location: [facility] CATH LAB;  Service: Cardiovascular;  Laterality: N/A;  . [place]; 1973; 1985   x 3  . NM MYOCAR PERF WALL MOTION  08/30/2009   No significant ischemia  . OPEN REDUCTION INTERNAL FIXATION (ORIF) DISTAL RADIAL FRACTURE Right 12/10/2016   Procedure: OPEN REDUCTION INTERNAL FIXATION (ORIF) DISTAL RADIAL FRACTURE;  Surgeon: Iran Planas, MD;  Location: [place];  Service: Orthopedics;  Laterality: Right;  . PERCUTANEOUS CORONARY INTERVENTION-BALLOON ONLY  12/22/2011   Procedure: PERCUTANEOUS CORONARY INTERVENTION-BALLOON ONLY;  Surgeon: Troy Sine, MD;  Location: [facility] CATH LAB;  Service: Cardiovascular;;  . PERCUTANEOUS CORONARY INTERVENTION-BALLOON ONLY  11/14/2012   Procedure: PERCUTANEOUS CORONARY INTERVENTION-BALLOON ONLY;  Surgeon: Troy Sine, MD;  Location: [facility] CATH LAB;  Service: Cardiovascular;;  . PERCUTANEOUS CORONARY STENT INTERVENTION (PCI-S)  05/16/2012   Procedure: PERCUTANEOUS CORONARY STENT INTERVENTION (PCI-S);  Surgeon: Lorretta Harp, MD;  [facility] CATH LAB;  Service: Cardiovascular;;  . PERCUTANEOUS CORONARY STENT INTERVENTION (PCI-S)  09/01/2013   Procedure: PERCUTANEOUS CORONARY STENT INTERVENTION (PCI-S);  Surgeon: Leonie Man, MD;  Location: [facility] CATH LAB;  Service: Cardiovascular;;    Family History  Problem Relation Age of Onset  . Heart disease Father   . Colon cancer Brother        early 49s  . Hypertension Mother   . Heart disease Mother   . Colon cancer Mother        died age 59. late 51s early 3s at diagnosis.   . Lung cancer Mother     Social History   Socioeconomic History  . Marital  status: Married    Spouse name: Not on file  . Number of children: Not on file  . Years of education: Not on file  . Highest education level: Not on file  Occupational History  . Occupation: Clinical biochemist  Social Needs  . Financial resource strain: Not on file  . Food insecurity:    Worry: Not on file    Inability: Not  on file  . Transportation needs:    Medical: Not on file    Non-medical: Not on file  Tobacco Use  . Smoking status: Current Some Day Smoker    Packs/day: 0.50    Types: Cigarettes    Last attempt to quit: 06/11/2015    Years since quitting: 2.5  . Smokeless tobacco: Never Used  . Tobacco comment: pt states smoking less than half a pack  Substance and Sexual Activity  . Alcohol use: No  . Drug use: No  . Sexual activity: Yes  Lifestyle  . Physical activity:    Days per week: Not on file    Minutes per session: Not on file  . Stress: Not on file  Relationships  . Social connections:    Talks on phone: Not on file    Gets together: Not on file    Attends religious service: Not on file    Active member of club or organization: Not on file    Attends meetings of clubs or organizations: Not on file    Relationship status: Not on file  . Intimate partner violence:    Fear of current or ex partner: Not on file    Emotionally abused: Not on file    Physically abused: Not on file    Forced sexual activity: Not on file  Other Topics Concern  . Not on file  Social History Narrative  . Not on file      ROS:  General: Negative for anorexia, weight loss, fever, chills, fatigue, weakness. Eyes: Negative for vision changes.  ENT: see hpi. CV: Negative for chest pain, angina, palpitations, dyspnea on exertion, peripheral edema.  Respiratory: Negative for dyspnea at rest, dyspnea on exertion, cough, sputum, wheezing.  GI: See history of present illness. GU:  Negative for dysuria, hematuria, urinary incontinence, urinary frequency, nocturnal  urination.  MS: + for joint pain, low back pain.  Derm: Negative for rash or itching.  Neuro: Negative for weakness, abnormal sensation, seizure, frequent headaches, memory loss, confusion.  Psych: Negative for anxiety, depression, suicidal ideation, hallucinations.  Endo: Negative for unusual weight change.  Heme: Negative for bruising or bleeding. Allergy: Negative for rash or hives.    Physical Examination:  BP 126/77   Pulse 88   Temp (!) 97 F (36.1 C) (Oral)   Ht 6' (1.829 m)   Wt 242 lb 9.6 oz (110 kg)   BMI 32.90 kg/m    General: Well-nourished, well-developed in no acute distress.  Head: Normocephalic, atraumatic.   Eyes: Conjunctiva pink, no icterus. Mouth: Oropharyngeal mucosa moist and pink , no lesions erythema or exudate. Neck: Supple without thyromegaly, masses, or lymphadenopathy.  Lungs: Clear to auscultation bilaterally.  Heart: Regular rate and rhythm, no murmurs rubs or gallops.  Abdomen: Bowel sounds are normal, nontender, nondistended, no hepatosplenomegaly or masses, no abdominal bruits or    hernia , no rebound or guarding.   Rectal: not performed Extremities: No lower extremity edema. No clubbing or deformities.  Neuro: Alert and oriented x 4 , grossly normal neurologically.  Skin: Warm and dry, no rash or jaundice.   Psych: Alert and cooperative, normal mood and affect.  Labs: Lab Results  Component Value Date   WBC 10.2 09/25/2017   HGB 16.3 09/25/2017   HCT 47.1 09/25/2017   MCV 91 09/25/2017   PLT 159 09/25/2017   Lab Results  Component Value Date   CREATININE 0.95 09/25/2017   BUN 17 09/25/2017   NA 137 09/25/2017  K 4.6 09/25/2017   CL 96 09/25/2017   CO2 25 09/25/2017    No results found for: IRON, TIBC, FERRITIN  Imaging Studies: No results found.

## 2018-01-02 NOTE — Patient Instructions (Signed)
1. I will touch base with your cardiologist regarding management of Brilinta and if we can proceed with endoscopy/colonoscopy. If you have not heard from me in one week, please call our office.

## 2018-01-05 ENCOUNTER — Other Ambulatory Visit: Payer: Self-pay | Admitting: Cardiovascular Disease

## 2018-01-06 ENCOUNTER — Telehealth: Payer: Self-pay | Admitting: Gastroenterology

## 2018-01-06 ENCOUNTER — Encounter: Payer: Self-pay | Admitting: Gastroenterology

## 2018-01-06 NOTE — Assessment & Plan Note (Signed)
Couple of months of intermittent diarrhea, several days a week with normal bowel movements in between. Using imodium several days per week. FH CRC. Last TCS 11 years ago. Given recent MI, Brilinta use in setting of late stent thrombosis, we will ask cardiology for clearance for procedure. He would require being off Brilinta for colonoscopy with possible EGD. Further recommendations to follow.

## 2018-01-06 NOTE — Telephone Encounter (Signed)
Dr. Sallyanne Kuster,   Patient has office visit on May 3rd with cardiology. We recently saw patient for possible colonoscopy/upper endoscopy for dysphagia and diarrhea.   Given recent MI, use of Brilinta, we would like cardiology clearance prior to consideration of GI procedures as he would require interruption of Brilinta.   Laureen Ochs. Bernarda Caffey [facility] [phone] 4/28/20199:33 PM

## 2018-01-06 NOTE — Assessment & Plan Note (Signed)
Swallowing difficulties since compound vertebral fracture one year ago. Pill and liquid dysphagia. No solid food dysphagia. Complains of hoarse, loss of voice intermittently but no significant heartburn. No PPI. Discussed possible noninvasive work up in near future ie BPE especially if unable to undergo invasive procedures given recent MI. Will await cardiology input.

## 2018-01-07 ENCOUNTER — Other Ambulatory Visit: Payer: Self-pay | Admitting: Cardiovascular Disease

## 2018-01-07 NOTE — Telephone Encounter (Signed)
Understood. Barring any recent complaints of angina at his upcoming appointment, it will be OK to temporarily stop Brilinta for 5 days for the endoscopic procedures and restart it once safe after the procedures. MCr

## 2018-01-07 NOTE — Progress Notes (Signed)
cc'd to pcp 

## 2018-01-07 NOTE — Telephone Encounter (Signed)
REFILL 

## 2018-01-11 ENCOUNTER — Ambulatory Visit: Payer: Medicare HMO | Admitting: Cardiovascular Disease

## 2018-01-21 ENCOUNTER — Ambulatory Visit: Payer: Medicare HMO | Admitting: Cardiovascular Disease

## 2018-01-22 DIAGNOSIS — M2550 Pain in unspecified joint: Secondary | ICD-10-CM | POA: Diagnosis not present

## 2018-01-22 DIAGNOSIS — M25551 Pain in right hip: Secondary | ICD-10-CM | POA: Diagnosis not present

## 2018-01-22 DIAGNOSIS — Z6832 Body mass index (BMI) 32.0-32.9, adult: Secondary | ICD-10-CM | POA: Diagnosis not present

## 2018-01-22 DIAGNOSIS — M5441 Lumbago with sciatica, right side: Secondary | ICD-10-CM | POA: Diagnosis not present

## 2018-01-22 DIAGNOSIS — M0579 Rheumatoid arthritis with rheumatoid factor of multiple sites without organ or systems involvement: Secondary | ICD-10-CM | POA: Diagnosis not present

## 2018-01-22 DIAGNOSIS — Z79899 Other long term (current) drug therapy: Secondary | ICD-10-CM | POA: Diagnosis not present

## 2018-01-22 DIAGNOSIS — E669 Obesity, unspecified: Secondary | ICD-10-CM | POA: Diagnosis not present

## 2018-01-29 DIAGNOSIS — E6609 Other obesity due to excess calories: Secondary | ICD-10-CM | POA: Diagnosis not present

## 2018-01-29 DIAGNOSIS — Z6832 Body mass index (BMI) 32.0-32.9, adult: Secondary | ICD-10-CM | POA: Diagnosis not present

## 2018-01-29 DIAGNOSIS — Z1389 Encounter for screening for other disorder: Secondary | ICD-10-CM | POA: Diagnosis not present

## 2018-01-29 DIAGNOSIS — I2510 Atherosclerotic heart disease of native coronary artery without angina pectoris: Secondary | ICD-10-CM | POA: Diagnosis not present

## 2018-01-29 DIAGNOSIS — E119 Type 2 diabetes mellitus without complications: Secondary | ICD-10-CM | POA: Diagnosis not present

## 2018-01-29 DIAGNOSIS — J449 Chronic obstructive pulmonary disease, unspecified: Secondary | ICD-10-CM | POA: Diagnosis not present

## 2018-01-29 DIAGNOSIS — I10 Essential (primary) hypertension: Secondary | ICD-10-CM | POA: Diagnosis not present

## 2018-01-29 DIAGNOSIS — E1140 Type 2 diabetes mellitus with diabetic neuropathy, unspecified: Secondary | ICD-10-CM | POA: Diagnosis not present

## 2018-01-29 DIAGNOSIS — I5042 Chronic combined systolic (congestive) and diastolic (congestive) heart failure: Secondary | ICD-10-CM | POA: Diagnosis not present

## 2018-01-29 DIAGNOSIS — E1165 Type 2 diabetes mellitus with hyperglycemia: Secondary | ICD-10-CM | POA: Diagnosis not present

## 2018-01-29 NOTE — Telephone Encounter (Signed)
Patient's appointment with cardiology was rescheduled originally by cardiology office but then the patient had to reschedule again and currently not scheduled to go back until July.  Unfortunately we cannot proceed with EGD or colonoscopy until he has been evaluated by cardiology per Dr. Sallyanne Kuster.   In the interim, if he is still having diarrhea, I would recommend stool for GI pathogen panel and Cdiff GDH with toxins.   Plan to see patient back after he sees cardiology and is cleared. He sees them on 03/28/18 so please plan for follow up closely after that.

## 2018-01-29 NOTE — Telephone Encounter (Signed)
Spoke with pt and he is aware that his procedures will be postponed until after he sees cardiology 03/28/18. Pt doesn't want to do stool testing and said he would think about doing test. Pt does have diarrhea and stops when pt takes otc imodium.   Pt will need a follow up after 03/28/18.

## 2018-01-30 ENCOUNTER — Encounter: Payer: Self-pay | Admitting: Internal Medicine

## 2018-01-30 NOTE — Telephone Encounter (Signed)
PATIENT SCHEDULED AND LETTER SENT  °

## 2018-02-14 ENCOUNTER — Ambulatory Visit (HOSPITAL_COMMUNITY): Payer: Medicare HMO | Attending: Physician Assistant

## 2018-02-14 ENCOUNTER — Other Ambulatory Visit: Payer: Self-pay

## 2018-02-14 DIAGNOSIS — M25551 Pain in right hip: Secondary | ICD-10-CM | POA: Insufficient documentation

## 2018-02-14 DIAGNOSIS — R2689 Other abnormalities of gait and mobility: Secondary | ICD-10-CM | POA: Insufficient documentation

## 2018-02-14 DIAGNOSIS — M25651 Stiffness of right hip, not elsewhere classified: Secondary | ICD-10-CM | POA: Diagnosis not present

## 2018-02-14 NOTE — Therapy (Signed)
[place], [place], [address] Phone: [phone]   Fax:  [phone]  Physical Therapy Evaluation  Patient Details  Name: Robert [name] MRN: [id number] Date of Birth: [birth year] Referring Provider: Leafy Kindle, PA   Encounter Date: 02/14/2018  PT End of Session - 02/14/18 1820    Visit Number  1    Number of Visits  7    Date for PT Re-Evaluation  03/28/18    Authorization Type  [employer] HMO (no auth required, visit limit)    Authorization Time Period  02/14/18-03/28/18    Authorization - Visit Number  1    Authorization - Number of Visits  10    PT Start Time  1302    PT Stop Time  4166    PT Time Calculation (min)  45 min    Activity Tolerance  Patient tolerated treatment well    Behavior During Therapy  [facility] for tasks assessed/performed       Past Medical History:  Diagnosis Date  . Anxiety   . COPD (chronic obstructive pulmonary disease) ([place])   . Coronary artery disease   . Full dentures   . GERD (gastroesophageal reflux disease)    Rolaids as needed  . High cholesterol   . History of MI (myocardial infarction)   . Hypertension    med. dosage increased 04/2016; has been on BP med. x 10 yrs.  . Incarcerated umbilical hernia 02/3015  . Inguinal hernia 05/2016   bilateral   . Insulin dependent diabetes mellitus ([place])   . Ischemic cardiomyopathy    a. 03/2015 EF 40-45% by LV gram.  . Left leg cellulitis 10/08/2016  . Rheumatoid arthritis(714.0)     Past Surgical History:  Procedure Laterality Date  . CARDIAC CATHETERIZATION N/A 02/01/2015   Procedure: Left Heart Cath and Coronary Angiography;  Surgeon: Belva Crome, MD;  Location: [place] [facility];  Service: Cardiovascular;  Laterality: N/A;  . CARDIAC CATHETERIZATION N/A 03/22/2015   Procedure: Left Heart Cath and Coronary Angiography;  Surgeon: Leonie Man, MD;  Location: [place] [facility];  Service: Cardiovascular;  Laterality: N/A;   . CARDIAC CATHETERIZATION  09/05/2002; 03/09/2003; 04/10/2005;06/02/2005; 05/09/2006; 02/09/2007; 03/01/2007  . COLONOSCOPY  2008   Dr. Gala Romney: diverticulosis, hyperplastic polyp.  . CORONARY ANGIOPLASTY  11/14/2012; 02/01/2015  . CORONARY ANGIOPLASTY WITH STENT PLACEMENT  05/16/2012   "1; makes total ~ 4"  . CORONARY STENT INTERVENTION N/A 06/06/2017   Procedure: CORONARY STENT INTERVENTION;  Surgeon: Belva Crome, MD;  Location: [place] [facility];  Service: Cardiovascular;  Laterality: N/A;  . INSERTION OF MESH Bilateral 05/24/2016   Procedure: INSERTION OF MESH;  Surgeon: Coralie Keens, MD;  Location: [place];  Service: General;  Laterality: Bilateral;  . LAPAROSCOPIC CHOLECYSTECTOMY    . LAPAROSCOPIC INGUINAL HERNIA WITH UMBILICAL HERNIA Bilateral 05/24/2016   Procedure: BILATERAL LAPAROSCOPIC INGUINAL HERNIA REPAIR WITH MESH AND UMBILICAL HERNIA REPAIR WITH MESH;  Surgeon: Coralie Keens, MD;  Location: [place];  Service: General;  Laterality: Bilateral;  . LEFT HEART CATH AND CORONARY ANGIOGRAPHY N/A 06/06/2017   Procedure: LEFT HEART CATH AND CORONARY ANGIOGRAPHY;  Surgeon: Belva Crome, MD;  Location: [place] [facility];  Service: Cardiovascular;  Laterality: N/A;  . LEFT HEART CATHETERIZATION WITH CORONARY ANGIOGRAM N/A 12/22/2011   Procedure: LEFT HEART CATHETERIZATION WITH CORONARY ANGIOGRAM;  Surgeon: Troy Sine, MD;  Location: [facility] C[facility] LAB;  Service: Cardiovascular;  Laterality: N/A;  . LEFT HEART CATHETERIZATION WITH CORONARY ANGIOGRAM Bilateral 05/16/2012   Procedure: LEFT HEART CATHETERIZATION WITH CORONARY ANGIOGRAM;  Surgeon: Lorretta Harp, MD;  Location: [facility] CATH LAB;  Service: Cardiovascular;  Laterality: Bilateral;  . LEFT HEART CATHETERIZATION WITH CORONARY ANGIOGRAM N/A 11/14/2012   Procedure: LEFT HEART CATHETERIZATION WITH CORONARY ANGIOGRAM;  Surgeon: Troy Sine, MD;  Location: [facility] CATH LAB;  Service: Cardiovascular;  Laterality: N/A;   . LEFT HEART CATHETERIZATION WITH CORONARY ANGIOGRAM N/A 09/01/2013   Procedure: LEFT HEART CATHETERIZATION WITH CORONARY ANGIOGRAM;  Surgeon: Leonie Man, MD;  Location: [facility] CATH LAB;  Service: Cardiovascular;  Laterality: N/A;  . [place]; 1973; 1985   x 3  . NM MYOCAR PERF WALL MOTION  08/30/2009   No significant ischemia  . OPEN REDUCTION INTERNAL FIXATION (ORIF) DISTAL RADIAL FRACTURE Right 12/10/2016   Procedure: OPEN REDUCTION INTERNAL FIXATION (ORIF) DISTAL RADIAL FRACTURE;  Surgeon: Iran Planas, MD;  Location: [place];  Service: Orthopedics;  Laterality: Right;  . PERCUTANEOUS CORONARY INTERVENTION-BALLOON ONLY  12/22/2011   Procedure: PERCUTANEOUS CORONARY INTERVENTION-BALLOON ONLY;  Surgeon: Troy Sine, MD;  Location: [facility] C[facility] LAB;  Service: Cardiovascular;;  . PERCUTANEOUS CORONARY INTERVENTION-BALLOON ONLY  11/14/2012   Procedure: PERCUTANEOUS CORONARY INTERVENTION-BALLOON ONLY;  Surgeon: Troy Sine, MD;  Location: [facility] CATH LAB;  Service: Cardiovascular;;  . PERCUTANEOUS CORONARY STENT INTERVENTION (PCI-S)  05/16/2012   Procedure: PERCUTANEOUS CORONARY STENT INTERVENTION (PCI-S);  Surgeon: Lorretta Harp, MD;  Location: [facility] CATH LAB;  Service: Cardiovascular;;  . PERCUTANEOUS CORONARY STENT INTERVENTION (PCI-S)  09/01/2013   Procedure: PERCUTANEOUS CORONARY STENT INTERVENTION (PCI-S);  Surgeon: Leonie Man, MD;  Location: [facility] CATH LAB;  Service: Cardiovascular;;    There were no vitals filed for this visit.   Subjective Assessment - 02/14/18 1820    Subjective  Patient reports a 1-2 year history of worsening Rt hip pain. He reports it has gotten gradually more severe in the last few months and he has great difficulty walking. He has a history of rheumatoid arthritis and has some bil hip/knee pain but the greatest is in his Rt hip. He denies numbness and tingling down his legs and denies pain initiating in his low back that spreads into his buttock/hip. He  reports his pain is localized to his right hip bone and does not travel into his groin either. He states he enjoys cmaping, walking in the woods, and hunting but has not gone on a big trip in ~ 2 years. He is hoping that with therapy he will be able to reduce his pain so he can start walking further again.    Pertinent History  Significant Cardiac History: CAD (7 stents), 9 MI's, unstable angina, HTN, DM, CHF    Limitations  Standing;Walking;House hold activities    How long can you sit comfortably?  unlimited    How long can you stand comfortably?  several minutes    How long can you walk comfortably?  maybe 25 yards before pain incresaes    Patient Stated Goals  to be able to walk farther without pain    Currently in Pain?  Yes    Pain Score  2     Pain Location  Hip    Pain Orientation  Right    Pain Descriptors / Indicators  Sore    Pain Type  Chronic pain    Pain Onset  More than a month ago    Pain Frequency  Constant  Aggravating Factors   walking, standing             02/14/18 0001  Assessment  Medical Diagnosis Right Hip Pain  Referring Provider Leafy Kindle, PA  Onset Date/Surgical Date  (1-2 years ago)  Next MD Visit 04/24/2018 with Rheumatologist (03/28/18 with Cardiologist)  Precautions  Precautions None  Precaution Comments Patient has Unstable Angina - carries Nitroglycerin with him  Restrictions  Weight Bearing Restrictions No  Balance Screen  Has the patient fallen in the past 6 months No  Has the patient had a decrease in activity level because of a fear of falling?  No  Is the patient reluctant to leave their home because of a fear of falling?  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 was involved  Leisure enjoys hunting and camping and walking in the woods, limited by the hip  Cognition  Overall Cognitive Status Within Functional Limits for tasks assessed  Observation/Other Assessments  Other Surveys  Other Surveys  Lower Extremity Functional Scale  20/80 = functioning at 25% of maximal abilities  Functional Tests  Functional tests Squat;Step down;Single leg stance  Squat  Comments Decreased weight shift to Rt hip and pain with 5 reps  Step Down  Comments Patient performs on 4" step bil LE, incresaed reliance on Bil UE to perform on Rt LE and increased pain  Single Leg Stance  Comments Unable to perform bil LE  Posture/Postural Control  Posture/Postural Control Postural limitations  Postural Limitations Forward head;Rounded Shoulders;Increased lumbar lordosis  ROM / Strength  AROM / PROM / Strength Strength;AROM  AROM  AROM Assessment Site Hip  Right/Left Hip Left;Right  Right Hip Extension 5  Right Hip Flexion 98  Right Hip External Rotation  35  Right Hip Internal Rotation  15  Left Hip Extension 8  Left Hip Flexion 93  Left Hip External Rotation  36  Left Hip Internal Rotation  28  Strength  Strength Assessment Site Hip;Knee;Ankle  Right/Left Knee Right;Left  Right Hip Flexion 4/5  Right Hip Extension 4/5  Right Hip ABduction 4/5 (pain)  Left Hip Flexion 4/5  Left Hip Extension 4/5  Left Hip ABduction 4/5  Right Knee Flexion 4-/5  Right Knee Extension 4+/5  Left Knee Flexion 3+/5  Left Knee Extension 4+/5  Right Ankle Dorsiflexion 4+/5  Left Ankle Dorsiflexion 4+/5  Flexibility  Soft Tissue Assessment /Muscle Length y  Hamstrings Bil LE: 90/145  Palpation  Palpation  comment tenderness to palpation along Rt greater tuberosity  Transfers  Five time sit to stand comments  18.5 without UE support  Ambulation/Gait  Ambulation/Gait Yes  Ambulation/Gait Assistance 7: Independent (pt sometimes uses SPC)  Ambulation Distance (Feet) 326 Feet (2MWT)  Assistive device None  Gait Pattern Step-through pattern;Decreased stride length;Decreased stance time - right;Decreased step length - right;Decreased weight shift to right;Antalgic;Lateral trunk lean to right;Trunk flexed;Wide base of support  Ambulation Surface Level  Gait velocity 0.8 m/s  Stairs Yes  Stairs Assistance 6: Modified independent (Device/Increase time)  Stair Management  Technique Two rails;Step to pattern;Forwards  Number of Stairs 4  Height of Stairs 6     Objective measurements completed on examination: See above findings.      02/14/18 0001  Transfers  Five time sit to stand comments  18.5 without UE support  Ambulation/Gait  Ambulation/Gait Yes  Ambulation/Gait Assistance 7: Independent (pt sometimes uses SPC)  Ambulation Distance (Feet) 326 Feet (2MWT)  Assistive device None  Gait Pattern Step-through pattern;Decreased stride length;Decreased stance time - right;Decreased step length - right;Decreased weight shift to right;Antalgic;Lateral trunk lean to right;Trunk flexed;Wide base of support  Ambulation Surface Level  Gait velocity 0.8 m/s  Stairs Yes  Stairs Assistance 6: Modified independent (Device/Increase time)  Stair Management Technique Two rails;Step to pattern;Forwards  Number of Stairs 4  Height of Stairs 6  Posture/Postural Control  Posture/Postural Control Postural limitations  Postural Limitations Forward head;Rounded Shoulders;Increased lumbar lordosis  Exercises  Exercises Knee/Hip  Knee/Hip Exercises: Standing  Other Standing Knee Exercises Hip hike, 1x 10 reps on step  Knee/Hip Exercises: Supine  Bridges Strengthening;Both;1 set;10 reps     PT  Education - 02/14/18 1820    Education Details  Educated on exam findings and intial HEP.    Person(s) Educated  Patient    Methods  Explanation;Handout    Comprehension  Verbalized understanding       PT Short Term Goals - 02/14/18 1830      PT SHORT TERM GOAL #1   Title  Patient will be independent with HEP, updated PRN, to improve functional mobility, strength to improve gait    Time  3    Period  Weeks    Status  New    Target Date  03/07/18      PT SHORT TERM GOAL #2   Title  Patient will improve Rt hip ROM for internal rotation by 8 degrees or more to demonstrate significant improvement in functional ROM.    Time  3    Period  Weeks    Status  New      PT SHORT TERM GOAL #3   Title  Patient will improve 5x sit to stand time by 4 seconds to demonstrate significant improvement in bil LE strength to be able to perform functional mobility with improved ease/quality.    Time  3    Period  Weeks    Status  New      PT SHORT TERM GOAL #4   Title  Patient will perform SLS for 10 seonds on Bil LE to demonstrate improved balance    Time  3    Period  Weeks    Status  New        PT Long Term Goals - 02/15/18 1924      PT LONG TERM GOAL #1   Title  Patient will perform SLS for 15 seonds on Bil LE to demonstrate improved balance    Time  6    Period  Weeks    Status  New    Target Date  03/28/18      PT LONG TERM GOAL #2   Title  Patient will perform step down test from 6" step, 5x with 1x UE support and no pain to demonstrate improved functional LE strength and tolerance to endurance activity    Time  6    Period  Weeks    Status  New      PT LONG TERM GOAL #3   Title  Patient will perform 2  MWT at 0.8 m/s or faster with no increase in Rt hip pain and normalized gait pattern.    Time  6    Period  Weeks    Status  New        Plan - 02/14/18 1823    Clinical Impression Statement  Mr. Gee present for physical therapy evaluation for increasing Rt hip pain and  difficulty walking. Objective testing reveals decreased bil LE strength, impaired balance, decreased endurance, decreased Rt hip ROM, impaired flexibility, and pain. His pain is localized to the Rt greater trochanter and does not radiate distal or proximal. He is limited with walking greatly due to pain and demonstrates an antalgic gait pattern with Rt trunk lean during Rt stance, likely to compensate for Rt gluteus medius weakness and pain. He also has pain with Rt hip abduction indicating there may be gluteal tendinopathy. He will benefit from skilled PT interventions to improve functional mobility, reduce pain, and improve QOL.    History and Personal Factors relevant to plan of care:  unstable angina, 9 MI's, & stents (cardiac), DM, CHF, CAD    Clinical Presentation  Stable    Clinical Presentation due to:  MMT, 2MWT, 5x Sit to Stand, ROM, clinical judgement, LEFS    Clinical Decision Making  Low    Clinical Impairments Affecting Rehab Potential  (-) financial limitations    PT Frequency  1x / week    PT Duration  6 weeks    PT Treatment/Interventions  ADLs/Self Care Home Management;Aquatic Therapy;Electrical Stimulation;Cryotherapy;DME Instruction;Gait training;Stair training;Functional mobility training;Therapeutic activities;Therapeutic exercise;Balance training;Neuromuscular re-education;Patient/family education;Manual techniques;Passive range of motion    PT Next Visit Plan  Reveiw eval and goals. Initiate hip strengthening and balance activities. Perform manual therapy to mobilize Rt hip joint. Provide hip stretch for hamstring and internal/external hip rotaters.    PT Home Exercise Plan  Eval: bridges, hip hike on step    Consulted and Agree with Plan of Care  Patient       Patient will benefit from skilled therapeutic intervention in order to improve the following deficits and impairments:  Abnormal gait, Increased fascial restricitons, Impaired sensation, Improper body mechanics, Pain,  Decreased mobility, Postural dysfunction, Decreased activity tolerance, Decreased endurance, Decreased strength, Decreased balance, Difficulty walking, Decreased range of motion, Hypomobility, Impaired flexibility, Obesity  Visit Diagnosis: Pain in right hip  Stiffness of right hip, not elsewhere classified  Other abnormalities of gait and mobility     Problem List Patient Active Problem List   Diagnosis Date Noted  . Dysphagia 01/02/2018  . Diarrhea 01/02/2018  . Family history of colon cancer 01/02/2018  . Ischemic cardiomyopathy   . Insulin dependent diabetes mellitus ([place])   . Hypertension   . History of MI (myocardial infarction)   . GERD (gastroesophageal reflux disease)   . Full dentures   . Coronary artery disease   . Anxiety   . Acute ST elevation myocardial infarction (STEMI) of inferior wall ([place]) 06/06/2017  . Varicose veins of left lower extremity with complications 54/27/0623  . Cellulitis of left leg without foot   . Right radial fracture 12/11/2016  . T3 vertebral fracture ([place]) 12/11/2016  . Fall 12/09/2016  . Thrombocytopenia ([place]) 10/09/2016  . Left leg cellulitis 10/08/2016  . Diabetes ([place]) 10/08/2016  . Inguinal hernia 05/12/2016  . Incarcerated umbilical hernia 76/28/3151  . Chronic combined systolic and diastolic CHF (congestive heart failure) ([place]) 04/25/2016  . High cholesterol   . Type II diabetes mellitus ([place])   .  Essential hypertension   . Non-STEMI (non-ST elevated myocardial infarction) ([place])   . CAD S/P percutaneous coronary angioplasty   . Unstable angina ([place]) 03/20/2015  . ICM-EF 35% at cath 02/01/15 02/02/2015  . CAD (coronary artery disease) 01/09/2015  . DM type 2 causing vascular disease, not at goal [facility]) 05/29/2014  . Dyspnea 10/20/2013  . COPD (chronic obstructive pulmonary disease) ([place]) 09/03/2013  . Hyperlipidemia   . Old myocardial infarction 09/01/2013  . Abnormal nuclear stress test 05/17/2012  . S/P CFX PTCAI for ISR  02/01/15 05/17/2012  . PVD, chronic LLE 12/22/2011  . Rheumatoid arthritis ([place]) 12/22/2011  . Contrast media allergy 12/22/2011  . Tobacco abuse 12/21/2011    Kipp Brood, PT, DPT Physical Therapist with [facility]  02/15/2018 8:07 PM    [place] [address] [place], [place], [address] Phone: [phone]   Fax:  [phone]  Name: KAMERYN TISDEL MRN: [id number] Date of Birth: 03/08/1951

## 2018-02-21 ENCOUNTER — Telehealth (HOSPITAL_COMMUNITY): Payer: Self-pay

## 2018-02-21 ENCOUNTER — Ambulatory Visit (HOSPITAL_COMMUNITY): Payer: Medicare HMO

## 2018-02-21 NOTE — Telephone Encounter (Signed)
Patient left a mess to cancel his appt for today

## 2018-02-27 ENCOUNTER — Telehealth (HOSPITAL_COMMUNITY): Payer: Self-pay

## 2018-02-27 NOTE — Telephone Encounter (Signed)
Patient called to cancel all of his appt saying he can't afford the copay. Mickel Baas let him know that he can still come and be billed for it

## 2018-02-28 ENCOUNTER — Other Ambulatory Visit: Payer: Self-pay

## 2018-02-28 ENCOUNTER — Encounter (HOSPITAL_COMMUNITY): Payer: Self-pay

## 2018-02-28 ENCOUNTER — Ambulatory Visit (HOSPITAL_COMMUNITY): Payer: Medicare HMO

## 2018-02-28 DIAGNOSIS — R2689 Other abnormalities of gait and mobility: Secondary | ICD-10-CM | POA: Diagnosis not present

## 2018-02-28 DIAGNOSIS — M25651 Stiffness of right hip, not elsewhere classified: Secondary | ICD-10-CM | POA: Diagnosis not present

## 2018-02-28 DIAGNOSIS — M25551 Pain in right hip: Secondary | ICD-10-CM | POA: Diagnosis not present

## 2018-02-28 NOTE — Therapy (Signed)
[place], [place], [address] Phone: [phone]   Fax:  [phone]  Physical Therapy Treatment  Patient Details  Name: Robert [name] MRN: [id number] Date of Birth: 1950-09-14 Referring Provider: Leafy Kindle, PA   Encounter Date: 02/28/2018  PT End of Session - 02/28/18 1417    Visit Number  2    Number of Visits  7    Date for PT Re-Evaluation  03/28/18    Authorization Type  [employer] H[employer] (no auth required, visit limit)    Authorization Time Period  02/14/18-03/28/18    Authorization - Visit Number  2    Authorization - Number of Visits  10    PT Start Time  5462    PT Stop Time  1434    PT Time Calculation (min)  40 min    Activity Tolerance  Patient tolerated treatment well    Behavior During Therapy  [facility] for tasks assessed/performed       Past Medical History:  Diagnosis Date  . Anxiety   . COPD (chronic obstructive pulmonary disease) ([place])   . Coronary artery disease   . Full dentures   . GERD (gastroesophageal reflux disease)    Rolaids as needed  . High cholesterol   . History of MI (myocardial infarction)   . Hypertension    med. dosage increased 04/2016; has been on BP med. x 10 yrs.  . Incarcerated umbilical hernia 70/3500  . Inguinal hernia 05/2016   bilateral   . Insulin dependent diabetes mellitus ([place])   . Ischemic cardiomyopathy    a. 03/2015 EF 40-45% by LV gram.  . Left leg cellulitis 10/08/2016  . Rheumatoid arthritis(714.0)     Past Surgical History:  Procedure Laterality Date  . CARDIAC CATHETERIZATION N/A 02/01/2015   Procedure: Left Heart Cath and Coronary Angiography;  Surgeon: Belva Crome, MD;  Location: [place] [facility];  Service: Cardiovascular;  Laterality: N/A;  . CARDIAC CATHETERIZATION N/A 03/22/2015   Procedure: Left Heart Cath and Coronary Angiography;  Surgeon: Leonie Man, MD;  Location: [place] [facility];  Service: Cardiovascular;  Laterality: N/A;   . CARDIAC CATHETERIZATION  09/05/2002; 03/09/2003; 04/10/2005;06/02/2005; 05/09/2006; 02/09/2007; 03/01/2007  . COLONOSCOPY  2008   Dr. Gala Romney: diverticulosis, hyperplastic polyp.  . CORONARY ANGIOPLASTY  11/14/2012; 02/01/2015  . CORONARY ANGIOPLASTY WITH STENT PLACEMENT  05/16/2012   "1; makes total ~ 4"  . CORONARY STENT INTERVENTION N/A 06/06/2017   Procedure: CORONARY STENT INTERVENTION;  Surgeon: Belva Crome, MD;  Location: [place] [facility];  Service: Cardiovascular;  Laterality: N/A;  . INSERTION OF MESH Bilateral 05/24/2016   Procedure: INSERTION OF MESH;  Surgeon: Coralie Keens, MD;  Location: [place];  Service: General;  Laterality: Bilateral;  . LAPAROSCOPIC CHOLECYSTECTOMY    . LAPAROSCOPIC INGUINAL HERNIA WITH UMBILICAL HERNIA Bilateral 05/24/2016   Procedure: BILATERAL LAPAROSCOPIC INGUINAL HERNIA REPAIR WITH MESH AND UMBILICAL HERNIA REPAIR WITH MESH;  Surgeon: Coralie Keens, MD;  Location: [place];  Service: General;  Laterality: Bilateral;  . LEFT HEART CATH AND CORONARY ANGIOGRAPHY N/A 06/06/2017   Procedure: LEFT HEART CATH AND CORONARY ANGIOGRAPHY;  Surgeon: Belva Crome, MD;  Location: [place] [facility];  Service: Cardiovascular;  Laterality: N/A;  . LEFT HEART CATHETERIZATION WITH CORONARY ANGIOGRAM N/A 12/22/2011   Procedure: LEFT HEART CATHETERIZATION WITH CORONARY ANGIOGRAM;  Surgeon: [place] Sine, MD;  Location: [facility] CATH LAB;  Service: Cardiovascular;  Laterality: N/A;  . LEFT HEART CATHETERIZATION WITH CORONARY ANGIOGRAM Bilateral 05/16/2012   Procedure: LEFT HEART CATHETERIZATION WITH CORONARY ANGIOGRAM;  Surgeon: Lorretta Harp, MD;  Location: [facility] CATH LAB;  Service: Cardiovascular;  Laterality: Bilateral;  . LEFT HEART CATHETERIZATION WITH CORONARY ANGIOGRAM N/A 11/14/2012   Procedure: LEFT HEART CATHETERIZATION WITH CORONARY ANGIOGRAM;  Surgeon: [place] Sine, MD;  Location: [facility] CATH LAB;  Service: Cardiovascular;  Laterality: N/A;   . LEFT HEART CATHETERIZATION WITH CORONARY ANGIOGRAM N/A 09/01/2013   Procedure: LEFT HEART CATHETERIZATION WITH CORONARY ANGIOGRAM;  Surgeon: Leonie Man, MD;  Location: [facility] CATH LAB;  Service: Cardiovascular;  Laterality: N/A;  . [place]; 1973; 1985   x 3  . NM MYOCAR PERF WALL MOTION  08/30/2009   No significant ischemia  . OPEN REDUCTION INTERNAL FIXATION (ORIF) DISTAL RADIAL FRACTURE Right 12/10/2016   Procedure: OPEN REDUCTION INTERNAL FIXATION (ORIF) DISTAL RADIAL FRACTURE;  Surgeon: Iran Planas, MD;  Location: [place];  Service: Orthopedics;  Laterality: Right;  . PERCUTANEOUS CORONARY INTERVENTION-BALLOON ONLY  12/22/2011   Procedure: PERCUTANEOUS CORONARY INTERVENTION-BALLOON ONLY;  Surgeon: [place] Sine, MD;  Location: [facility] CATH LAB;  Service: Cardiovascular;;  . PERCUTANEOUS CORONARY INTERVENTION-BALLOON ONLY  11/14/2012   Procedure: PERCUTANEOUS CORONARY INTERVENTION-BALLOON ONLY;  Surgeon: [place] Sine, MD;  Location: [facility] CATH LAB;  Service: Cardiovascular;;  . PERCUTANEOUS CORONARY STENT INTERVENTION (PCI-S)  05/16/2012   Procedure: PERCUTANEOUS CORONARY STENT INTERVENTION (PCI-S);  Surgeon: Lorretta Harp, MD;  Location: [facility] CATH LAB;  Service: Cardiovascular;;  . PERCUTANEOUS CORONARY STENT INTERVENTION (PCI-S)  09/01/2013   Procedure: PERCUTANEOUS CORONARY STENT INTERVENTION (PCI-S);  Surgeon: Leonie Man, MD;  Location: [facility] CATH LAB;  Service: Cardiovascular;;    There were no vitals filed for this visit.  Subjective Assessment - 02/28/18 1416    Subjective  Patient reports he has been doign his HEP except for the bridge because they really hurt his back. He states he has not done a lot today and is not in much pain.    Pertinent History  Significant Cardiac History: CAD (7 stents), 9 MI's, unstable angina, HTN, DM, CHF    Limitations  Standing;Walking;House hold activities    How long can you sit comfortably?  unlimited    How long can you stand  comfortably?  several minutes    How long can you walk comfortably?  maybe 25 yards before pain incresaes    Patient Stated Goals  to be able to walk farther without pain    Pain Score  2     Pain Location  Hip    Pain Orientation  Right    Pain Descriptors / Indicators  Aching    Pain Type  Chronic pain    Pain Onset  More than a month ago    Pain Frequency  Constant    Aggravating Factors   walking, standing       [facility] Adult PT Treatment/Exercise - 02/28/18 0001      Knee/Hip Exercises: Stretches   Other Knee/Hip Stretches  supine External rotation stretch: bil LE, 2x 30 seconds      Knee/Hip Exercises: Standing   Hip Extension  Stengthening;Both;2 sets;15 reps;Knee straight;Limitations    Extension Limitations  red theraband at ankles    Forward Step Up  Both;1 set;15 reps;Hand Hold: 1;Step Height: 6"    Other Standing Knee Exercises  Hip hike, 2x 15 reps on step    Other Standing Knee Exercises  Side stepping  wtih Red T at knees, 3x 15" RT      Manual Therapy   Manual Therapy  Joint mobilization    Manual therapy comments  manual completed seperate from other interventions    Joint Mobilization  3x 30-45 seconds grade III oscilations, with laterl glide to Rt hip and Inferior glide at end range flexion.       Balance Exercises - 02/28/18 1443      Balance Exercises: Standing   SLS  Eyes open;Solid surface;Intermittent upper extremity support;15 secs;4 reps 4 reps Bil LE        PT Education - 02/28/18 1446    Education Details  Reviewed evaluation goals with patient. Educated on exercises throughout session and updated HEP.    Person(s) Educated  Patient    Methods  Explanation;Handout;Verbal cues;Tactile cues    Comprehension  Verbalized understanding;Returned demonstration       PT Short Term Goals - 02/28/18 1418      PT SHORT TERM GOAL #1   Title  Patient will be independent with HEP, updated PRN, to improve functional mobility, strength to improve gait     Time  3    Period  Weeks    Status  On-going      PT SHORT TERM GOAL #2   Title  Patient will improve Rt hip ROM for internal rotation by 8 degrees or more to demonstrate significant improvement in functional ROM.    Time  3    Period  Weeks    Status  On-going      PT SHORT TERM GOAL #3   Title  Patient will improve 5x sit to stand time by 4 seconds to demonstrate significant improvement in bil LE strength to be able to perform functional mobility with improved ease/quality.    Time  3    Period  Weeks    Status  On-going      PT SHORT TERM GOAL #4   Title  Patient will perform SLS for 10 seonds on Bil LE to demonstrate improved balance    Time  3    Period  Weeks    Status  On-going        PT Long Term Goals - 02/28/18 1418      PT LONG TERM GOAL #1   Title  Patient will perform SLS for 15 seonds on Bil LE to demonstrate improved balance    Time  6    Period  Weeks    Status  On-going      PT LONG TERM GOAL #2   Title  Patient will perform step down test from 6" step, 5x with 1x UE support and no pain to demonstrate improved functional LE strength and tolerance to endurance activity    Time  6    Period  Weeks    Status  On-going      PT LONG TERM GOAL #3   Title  Patient will perform 2 MWT at 1.0 m/s or faster with no increase in Rt hip pain and normalized gait pattern.    Time  6    Period  Weeks    Status  On-going      PT LONG TERM GOAL #4   Title  Patient will improve LEFS score by 9 points or greater to demonstrate significant improvement in self reported function.    Time  6    Period  Weeks    Status  On-going      PT  LONG TERM GOAL #5   Title  Patient will improve MMT for bil LE by 1/2 grade or more to demonstrate improved LE strength to improve mobility and gait quality    Time  6    Period  Weeks    Status  On-going        Plan - 02/28/18 1418    Clinical Impression Statement  Start of session reviewed evaluation and goals. Initiated manual  therapy with Rt hip joint mobilization for limitations, patient reported no change in pain with this intervention. He did report discomfort with bridge for HEP and hip extension with theraband was initiated and added to HEP as alternative. He demonstrated good posture with no trunk flexion as a compensation. SLS was performed with intermittent UE support as well and patient has greater difficulty with Lt LE SLS compared to Rt. He will continue to benefit from skilled PT interventions to improve functional mobility, reduce pain, and improve QOL.     Clinical Impairments Affecting Rehab Potential  (-) financial limitations    PT Frequency  1x / week    PT Duration  6 weeks    PT Treatment/Interventions  ADLs/Self Care Home Management;Aquatic Therapy;Electrical Stimulation;Cryotherapy;DME Instruction;Gait training;Stair training;Functional mobility training;Therapeutic activities;Therapeutic exercise;Balance training;Neuromuscular re-education;Patient/family education;Manual techniques;Passive range of motion    PT Next Visit Plan  Continue hip strengthening and balance activities. Perform manual therapy to mobilize Rt hip joint. Provide hip stretch for hamstring and internal/external hip rotators.    PT Home Exercise Plan  Eval: bridges, hip hike on step; 02/28/18 - discontinue bridge, added hip ext wtih band, SLS at counter;     Consulted and Agree with Plan of Care  Patient       Patient will benefit from skilled therapeutic intervention in order to improve the following deficits and impairments:  Abnormal gait, Increased fascial restricitons, Impaired sensation, Improper body mechanics, Pain, Decreased mobility, Postural dysfunction, Decreased activity tolerance, Decreased endurance, Decreased strength, Decreased balance, Difficulty walking, Decreased range of motion, Hypomobility, Impaired flexibility, Obesity  Visit Diagnosis: Pain in right hip  Stiffness of right hip, not elsewhere  classified  Other abnormalities of gait and mobility     Problem List Patient Active Problem List   Diagnosis Date Noted  . Dysphagia 01/02/2018  . Diarrhea 01/02/2018  . Family history of colon cancer 01/02/2018  . Ischemic cardiomyopathy   . Insulin dependent diabetes mellitus ([place])   . Hypertension   . History of MI (myocardial infarction)   . GERD (gastroesophageal reflux disease)   . Full dentures   . Coronary artery disease   . Anxiety   . Acute ST elevation myocardial infarction (STEMI) of inferior wall ([place]) 06/06/2017  . Varicose veins of left lower extremity with complications 02/40/9735  . Cellulitis of left leg without foot   . Right radial fracture 12/11/2016  . T3 vertebral fracture ([place]) 12/11/2016  . Fall 12/09/2016  . Thrombocytopenia ([place]) 10/09/2016  . Left leg cellulitis 10/08/2016  . Diabetes ([place]) 10/08/2016  . Inguinal hernia 05/12/2016  . Incarcerated umbilical hernia 32/99/2426  . Chronic combined systolic and diastolic CHF (congestive heart failure) ([place]) 04/25/2016  . High cholesterol   . Type II diabetes mellitus ([place])   . Essential hypertension   . Non-STEMI (non-ST elevated myocardial infarction) ([place])   . CAD S/P percutaneous coronary angioplasty   . Unstable angina ([place]) 03/20/2015  . ICM-EF 35% at cath 02/01/15 02/02/2015  . CAD (coronary artery disease) 01/09/2015  . DM type 2  causing vascular disease, not at goal [facility]) 05/29/2014  . Dyspnea 10/20/2013  . COPD (chronic obstructive pulmonary disease) ([place]) 09/03/2013  . Hyperlipidemia   . Old myocardial infarction 09/01/2013  . Abnormal nuclear stress test 05/17/2012  . S/P CFX PTCAI for ISR 02/01/15 05/17/2012  . PVD, chronic LLE 12/22/2011  . Rheumatoid arthritis ([place]) 12/22/2011  . Contrast media allergy 12/22/2011  . Tobacco abuse 12/21/2011    Kipp Brood, PT, DPT Physical Therapist with [facility]  02/28/2018 2:55 PM    [facility] [address] [place], [place], [address] Phone: [phone]   Fax:  [phone]  Name: Robert [name] MRN: [id number] Date of Birth: May 18, 1951

## 2018-03-07 ENCOUNTER — Ambulatory Visit (HOSPITAL_COMMUNITY): Payer: Medicare HMO | Admitting: Physical Therapy

## 2018-03-07 ENCOUNTER — Encounter (HOSPITAL_COMMUNITY): Payer: Self-pay | Admitting: Physical Therapy

## 2018-03-07 DIAGNOSIS — R2689 Other abnormalities of gait and mobility: Secondary | ICD-10-CM | POA: Diagnosis not present

## 2018-03-07 DIAGNOSIS — M25651 Stiffness of right hip, not elsewhere classified: Secondary | ICD-10-CM | POA: Diagnosis not present

## 2018-03-07 DIAGNOSIS — M25551 Pain in right hip: Secondary | ICD-10-CM | POA: Diagnosis not present

## 2018-03-07 NOTE — Therapy (Signed)
[place], [place], [address] Phone: [phone]   Fax:  [phone]  Physical Therapy Treatment  Patient Details  Name: Robert [name] MRN: [id number] Date of Birth: Nov 04, 1950 Referring Provider: Leafy Kindle, PA   Encounter Date: 03/07/2018  PT End of Session - 03/07/18 1358    Visit Number  3    Number of Visits  7    Date for PT Re-Evaluation  03/28/18    Authorization Type  [employer] HMO (no auth required, visit limit)    Authorization Time Period  02/14/18-03/28/18    Authorization - Visit Number  3    Authorization - Number of Visits  10    PT Start Time  1350    PT Stop Time  1429    PT Time Calculation (min)  39 min    Equipment Utilized During Treatment  Gait belt    Activity Tolerance  Patient tolerated treatment well    Behavior During Therapy  [facility] for tasks assessed/performed       Past Medical History:  Diagnosis Date  . Anxiety   . COPD (chronic obstructive pulmonary disease) ([place])   . Coronary artery disease   . Full dentures   . GERD (gastroesophageal reflux disease)    Rolaids as needed  . High cholesterol   . History of MI (myocardial infarction)   . Hypertension    med. dosage increased 04/2016; has been on BP med. x 10 yrs.  . Incarcerated umbilical hernia 43/1540  . Inguinal hernia 05/2016   bilateral   . Insulin dependent diabetes mellitus ([place])   . Ischemic cardiomyopathy    a. 03/2015 EF 40-45% by LV gram.  . Left leg cellulitis 10/08/2016  . Rheumatoid arthritis(714.0)     Past Surgical History:  Procedure Laterality Date  . CARDIAC CATHETERIZATION N/A 02/01/2015   Procedure: Left Heart Cath and Coronary Angiography;  Surgeon: Belva Crome, MD;  Location: [place] CV LAB;  Service: Cardiovascular;  Laterality: N/A;  . CARDIAC CATHETERIZATION N/A 03/22/2015   Procedure: Left Heart Cath and Coronary Angiography;  Surgeon: Leonie Man, MD;  Location: [place]  CV LAB;  Service: Cardiovascular;  Laterality: N/A;  . CARDIAC CATHETERIZATION  09/05/2002; 03/09/2003; 04/10/2005;06/02/2005; 05/09/2006; 02/09/2007; 03/01/2007  . COLONOSCOPY  2008   Dr. Gala Romney: diverticulosis, hyperplastic polyp.  . CORONARY ANGIOPLASTY  11/14/2012; 02/01/2015  . CORONARY ANGIOPLASTY WITH STENT PLACEMENT  05/16/2012   "1; makes total ~ 4"  . CORONARY STENT INTERVENTION N/A 06/06/2017   Procedure: CORONARY STENT INTERVENTION;  Surgeon: Belva Crome, MD;  Location: [place] CV LAB;  Service: Cardiovascular;  Laterality: N/A;  . INSERTION OF MESH Bilateral 05/24/2016   Procedure: INSERTION OF MESH;  Surgeon: Coralie Keens, MD;  Location: [place];  Service: General;  Laterality: Bilateral;  . LAPAROSCOPIC CHOLECYSTECTOMY    . LAPAROSCOPIC INGUINAL HERNIA WITH UMBILICAL HERNIA Bilateral 05/24/2016   Procedure: BILATERAL LAPAROSCOPIC INGUINAL HERNIA REPAIR WITH MESH AND UMBILICAL HERNIA REPAIR WITH MESH;  Surgeon: Coralie Keens, MD;  Location: [place];  Service: General;  Laterality: Bilateral;  . LEFT HEART CATH AND CORONARY ANGIOGRAPHY N/A 06/06/2017   Procedure: LEFT HEART CATH AND CORONARY ANGIOGRAPHY;  Surgeon: Belva Crome, MD;  Location: [place] CV LAB;  Service: Cardiovascular;  Laterality: N/A;  . LEFT HEART CATHETERIZATION WITH CORONARY ANGIOGRAM N/A 12/22/2011   Procedure: LEFT HEART CATHETERIZATION WITH CORONARY ANGIOGRAM;  Surgeon: Troy Sine,  MD;  Location: [place] CATH LAB;  Service: Cardiovascular;  Laterality: N/A;  . LEFT HEART CATHETERIZATION WITH CORONARY ANGIOGRAM Bilateral 05/16/2012   Procedure: LEFT HEART CATHETERIZATION WITH CORONARY ANGIOGRAM;  Surgeon: Lorretta Harp, MD;  Location: [facility] CATH LAB;  Service: Cardiovascular;  Laterality: Bilateral;  . LEFT HEART CATHETERIZATION WITH CORONARY ANGIOGRAM N/A 11/14/2012   Procedure: LEFT HEART CATHETERIZATION WITH CORONARY ANGIOGRAM;  Surgeon: Troy Sine, MD;  Location: [facility]  CATH LAB;  Service: Cardiovascular;  Laterality: N/A;  . LEFT HEART CATHETERIZATION WITH CORONARY ANGIOGRAM N/A 09/01/2013   Procedure: LEFT HEART CATHETERIZATION WITH CORONARY ANGIOGRAM;  Surgeon: Leonie Man, MD;  Location: [facility] CATH LAB;  Service: Cardiovascular;  Laterality: N/A;  . [place]; 1973; 1985   x 3  . NM MYOCAR PERF WALL MOTION  08/30/2009   No significant ischemia  . OPEN REDUCTION INTERNAL FIXATION (ORIF) DISTAL RADIAL FRACTURE Right 12/10/2016   Procedure: OPEN REDUCTION INTERNAL FIXATION (ORIF) DISTAL RADIAL FRACTURE;  Surgeon: Iran Planas, MD;  Location: [place];  Service: Orthopedics;  Laterality: Right;  . PERCUTANEOUS CORONARY INTERVENTION-BALLOON ONLY  12/22/2011   Procedure: PERCUTANEOUS CORONARY INTERVENTION-BALLOON ONLY;  Surgeon: Troy Sine, MD;  Location: [facility] CATH LAB;  Service: Cardiovascular;;  . PERCUTANEOUS CORONARY INTERVENTION-BALLOON ONLY  11/14/2012   Procedure: PERCUTANEOUS CORONARY INTERVENTION-BALLOON ONLY;  Surgeon: Troy Sine, MD;  Location: [facility] CATH LAB;  Service: Cardiovascular;;  . PERCUTANEOUS CORONARY STENT INTERVENTION (PCI-S)  05/16/2012   Procedure: PERCUTANEOUS CORONARY STENT INTERVENTION (PCI-S);  Surgeon: Lorretta Harp, MD;  Location: [facility] CATH LAB;  Service: Cardiovascular;;  . PERCUTANEOUS CORONARY STENT INTERVENTION (PCI-S)  09/01/2013   Procedure: PERCUTANEOUS CORONARY STENT INTERVENTION (PCI-S);  Surgeon: Leonie Man, MD;  Location: [facility] CATH LAB;  Service: Cardiovascular;;    There were no vitals filed for this visit.  Subjective Assessment - 03/07/18 1353    Subjective  Patient denied any pain currently and stated that he has been doing his exercises at home.     Pertinent History  Significant Cardiac History: CAD (7 stents), 9 MI's, unstable angina, HTN, DM, CHF    Limitations  Standing;Walking;House hold activities    How long can you sit comfortably?  unlimited    How long can you stand comfortably?  several  minutes    How long can you walk comfortably?  maybe 25 yards before pain incresaes    Patient Stated Goals  to be able to walk farther without pain    Currently in Pain?  No/denies                       [facility] Adult PT Treatment/Exercise - 03/07/18 0001      Knee/Hip Exercises: Stretches   Other Knee/Hip Stretches  supine External rotation stretch: bil LE, 3x 30 seconds      Knee/Hip Exercises: Standing   Hip Extension  Stengthening;Both;2 sets;15 reps;Knee straight;Limitations    Extension Limitations  red theraband at ankles    Forward Step Up  Both;1 set;15 reps;Hand Hold: 1;Step Height: 6"    Other Standing Knee Exercises  Toe raises 2 x 10 both lower extremities. Hip hike, 2x 15 reps on step with mirror for visual cue    Other Standing Knee Exercises  Side stepping wtih Red theraband at knees, 3x 15" roundtrips      Manual Therapy   Manual Therapy  Joint mobilization    Manual therapy comments  manual completed seperate from other interventions  Joint Mobilization  3x 30-45 seconds grade III oscilations, with laterl glide to Rt hip and Inferior glide at end range flexion.             PT Education - 03/07/18 1354    Education Details  Patient was educated on purpose and technique of interventions throughout session.     Person(s) Educated  Patient    Methods  Explanation    Comprehension  Verbalized understanding       PT Short Term Goals - 02/28/18 1418      PT SHORT TERM GOAL #1   Title  Patient will be independent with HEP, updated PRN, to improve functional mobility, strength to improve gait    Time  3    Period  Weeks    Status  On-going      PT SHORT TERM GOAL #2   Title  Patient will improve Rt hip ROM for internal rotation by 8 degrees or more to demonstrate significant improvement in functional ROM.    Time  3    Period  Weeks    Status  On-going      PT SHORT TERM GOAL #3   Title  Patient will improve 5x sit to stand time by 4  seconds to demonstrate significant improvement in bil LE strength to be able to perform functional mobility with improved ease/quality.    Time  3    Period  Weeks    Status  On-going      PT SHORT TERM GOAL #4   Title  Patient will perform SLS for 10 seonds on Bil LE to demonstrate improved balance    Time  3    Period  Weeks    Status  On-going        PT Long Term Goals - 02/28/18 1418      PT LONG TERM GOAL #1   Title  Patient will perform SLS for 15 seonds on Bil LE to demonstrate improved balance    Time  6    Period  Weeks    Status  On-going      PT LONG TERM GOAL #2   Title  Patient will perform step down test from 6" step, 5x with 1x UE support and no pain to demonstrate improved functional LE strength and tolerance to endurance activity    Time  6    Period  Weeks    Status  On-going      PT LONG TERM GOAL #3   Title  Patient will perform 2 MWT at 1.0 m/s or faster with no increase in Rt hip pain and normalized gait pattern.    Time  6    Period  Weeks    Status  On-going      PT LONG TERM GOAL #4   Title  Patient will improve LEFS score by 9 points or greater to demonstrate significant improvement in self reported function.    Time  6    Period  Weeks    Status  On-going      PT LONG TERM GOAL #5   Title  Patient will improve MMT for bil LE by 1/2 grade or more to demonstrate improved LE strength to improve mobility and gait quality    Time  6    Period  Weeks    Status  On-going            Plan - 03/07/18 1441    Clinical Impression Statement  This  session continued to progress patient with hip mobility and lower extremity strengthening. This session added toe raises to improve dorsiflexion strength. Patient required verbal and tactile cues to properly perform hip extension standing exercises. Ended session with manual therapy with joint mobility with noted increased mobility in the right hip joint following manual therapy.     Clinical Impairments  Affecting Rehab Potential  (-) financial limitations    PT Frequency  1x / week    PT Duration  6 weeks    PT Treatment/Interventions  ADLs/Self Care Home Management;Aquatic Therapy;Electrical Stimulation;Cryotherapy;DME Instruction;Gait training;Stair training;Functional mobility training;Therapeutic activities;Therapeutic exercise;Balance training;Neuromuscular re-education;Patient/family education;Manual techniques;Passive range of motion    PT Next Visit Plan  Continue hip strengthening and balance activities. Perform manual therapy to mobilize Rt hip joint. Provide hip stretch for hamstring and internal/external hip rotators.    PT Home Exercise Plan  Eval: bridges, hip hike on step; 02/28/18 - discontinue bridge, added hip ext wtih band, SLS at counter;     Consulted and Agree with Plan of Care  Patient       Patient will benefit from skilled therapeutic intervention in order to improve the following deficits and impairments:  Abnormal gait, Increased fascial restricitons, Impaired sensation, Improper body mechanics, Pain, Decreased mobility, Postural dysfunction, Decreased activity tolerance, Decreased endurance, Decreased strength, Decreased balance, Difficulty walking, Decreased range of motion, Hypomobility, Impaired flexibility, Obesity  Visit Diagnosis: Pain in right hip  Stiffness of right hip, not elsewhere classified  Other abnormalities of gait and mobility     Problem List Patient Active Problem List   Diagnosis Date Noted  . Dysphagia 01/02/2018  . Diarrhea 01/02/2018  . Family history of colon cancer 01/02/2018  . Ischemic cardiomyopathy   . Insulin dependent diabetes mellitus ([place])   . Hypertension   . History of MI (myocardial infarction)   . GERD (gastroesophageal reflux disease)   . Full dentures   . Coronary artery disease   . Anxiety   . Acute ST elevation myocardial infarction (STEMI) of inferior wall ([place]) 06/06/2017  . Varicose veins of left lower  extremity with complications 81/27/5170  . Cellulitis of left leg without foot   . Right radial fracture 12/11/2016  . T3 vertebral fracture ([place]) 12/11/2016  . Fall 12/09/2016  . Thrombocytopenia ([place]) 10/09/2016  . Left leg cellulitis 10/08/2016  . Diabetes ([place]) 10/08/2016  . Inguinal hernia 05/12/2016  . Incarcerated umbilical hernia 01/74/9449  . Chronic combined systolic and diastolic CHF (congestive heart failure) ([place]) 04/25/2016  . High cholesterol   . Type II diabetes mellitus ([place])   . Essential hypertension   . Non-STEMI (non-ST elevated myocardial infarction) ([place])   . CAD S/P percutaneous coronary angioplasty   . Unstable angina ([place]) 03/20/2015  . ICM-EF 35% at cath 02/01/15 02/02/2015  . CAD (coronary artery disease) 01/09/2015  . DM type 2 causing vascular disease, not at goal [facility]) 05/29/2014  . Dyspnea 10/20/2013  . COPD (chronic obstructive pulmonary disease) ([place]) 09/03/2013  . Hyperlipidemia   . Old myocardial infarction 09/01/2013  . Abnormal nuclear stress test 05/17/2012  . S/P CFX PTCAI for ISR 02/01/15 05/17/2012  . PVD, chronic LLE 12/22/2011  . Rheumatoid arthritis ([place]) 12/22/2011  . Contrast media allergy 12/22/2011  . Tobacco abuse 12/21/2011   Clarene Critchley PT, DPT 2:57 PM, 03/07/18 [place] [address] [place], [place], [address] Phone: [phone]   Fax:  [phone]  Name: Robert [name] MRN: [id number] Date  of Birth: 29-Apr-1951

## 2018-03-13 ENCOUNTER — Ambulatory Visit (HOSPITAL_COMMUNITY): Payer: Medicare HMO | Attending: Physician Assistant

## 2018-03-13 ENCOUNTER — Telehealth (HOSPITAL_COMMUNITY): Payer: Self-pay

## 2018-03-13 DIAGNOSIS — M25551 Pain in right hip: Secondary | ICD-10-CM | POA: Insufficient documentation

## 2018-03-13 DIAGNOSIS — M25651 Stiffness of right hip, not elsewhere classified: Secondary | ICD-10-CM | POA: Insufficient documentation

## 2018-03-13 DIAGNOSIS — R2689 Other abnormalities of gait and mobility: Secondary | ICD-10-CM | POA: Insufficient documentation

## 2018-03-13 NOTE — Telephone Encounter (Signed)
No Show # 1: I called Mr. Nguyen to check in with him since he missed his 1:45 PM appointment. He answered and stated he forgot and did not look at his schedule. I reminded him of his next appointment on Thursday 03/21/18 at 1:45 PM and asked that if he cannot make this appointment to call our front office or cancel through the phone tree.  Kipp Brood, PT, DPT Physical Therapist with [facility]  03/13/2018 2:40 PM

## 2018-03-21 ENCOUNTER — Encounter (HOSPITAL_COMMUNITY): Payer: Self-pay

## 2018-03-21 ENCOUNTER — Ambulatory Visit (HOSPITAL_COMMUNITY): Payer: Medicare HMO

## 2018-03-21 DIAGNOSIS — R2689 Other abnormalities of gait and mobility: Secondary | ICD-10-CM | POA: Diagnosis not present

## 2018-03-21 DIAGNOSIS — M25551 Pain in right hip: Secondary | ICD-10-CM

## 2018-03-21 DIAGNOSIS — M25651 Stiffness of right hip, not elsewhere classified: Secondary | ICD-10-CM | POA: Diagnosis not present

## 2018-03-21 NOTE — Therapy (Addendum)
[place], [place], [address] Phone: [phone]   Fax:  [phone]  Physical Therapy Treatment / Re-assessment  Patient Details  Name: Robert [name] MRN: [id number] Date of Birth: [birth year] Referring Provider: Leafy Kindle, PA   Encounter Date: 5/45/6256   As a licensed physical therapist I have read and approve of the following note.  Clarene Critchley PT, DPT 8:49 AM, 03/22/18 [phone]  Progress Note Reporting Period 02/14/18 to 03/21/18  See note below for Objective Data and Assessment of Progress/Goals.       PT End of Session - 03/21/18 1515    Visit Number  4    Number of Visits  7    Date for PT Re-Evaluation  03/28/18 Reviewed goals 03/21/2018    Authorization Type  [employer] HMO (no auth required, visit limit)    Authorization Time Period  02/14/18-03/28/18    Authorization - Visit Number  4    Authorization - Number of Visits  10    PT Start Time  6811    PT Stop Time  1432    PT Time Calculation (min)  44 min    Equipment Utilized During Treatment  Gait belt    Activity Tolerance  Patient tolerated treatment well    Behavior During Therapy  WFL for tasks assessed/performed       Past Medical History:  Diagnosis Date  . Anxiety   . COPD (chronic obstructive pulmonary disease) ([place])   . Coronary artery disease   . Full dentures   . GERD (gastroesophageal reflux disease)    Rolaids as needed  . High cholesterol   . History of MI (myocardial infarction)   . Hypertension    med. dosage increased 04/2016; has been on BP med. x 10 yrs.  . Incarcerated umbilical hernia 57/2620  . Inguinal hernia 05/2016   bilateral   . Insulin dependent diabetes mellitus ([place])   . Ischemic cardiomyopathy    a. 03/2015 EF 40-45% by LV gram.  . Left leg cellulitis 10/08/2016  . Rheumatoid arthritis(714.0)     Past Surgical History:  Procedure Laterality Date  . CARDIAC CATHETERIZATION N/A 02/01/2015    Procedure: Left Heart Cath and Coronary Angiography;  Surgeon: Belva Crome, MD;  Location: [facility];  Service: Cardiovascular;  Laterality: N/A;  . CARDIAC CATHETERIZATION N/A 03/22/2015   Procedure: Left Heart Cath and Coronary Angiography;  Surgeon: Leonie Man, MD;  Location: [facility];  Service: Cardiovascular;  Laterality: N/A;  . CARDIAC CATHETERIZATION  09/05/2002; 03/09/2003; 04/10/2005;06/02/2005; 05/09/2006; 02/09/2007; 03/01/2007  . COLONOSCOPY  2008   Dr. Gala Romney: diverticulosis, hyperplastic polyp.  . CORONARY ANGIOPLASTY  11/14/2012; 02/01/2015  . CORONARY ANGIOPLASTY WITH STENT PLACEMENT  05/16/2012   "1; makes total ~ 4"  . CORONARY STENT INTERVENTION N/A 06/06/2017   Procedure: CORONARY STENT INTERVENTION;  Surgeon: Belva Crome, MD;  Location: [facility];  Service: Cardiovascular;  Laterality: N/A;  . INSERTION OF MESH Bilateral 05/24/2016   Procedure: INSERTION OF MESH;  Surgeon: Coralie Keens, MD;  Location: [facility];  Service: General;  Laterality: Bilateral;  . LAPAROSCOPIC CHOLECYSTECTOMY    . LAPAROSCOPIC INGUINAL HERNIA WITH UMBILICAL HERNIA Bilateral 05/24/2016   Procedure: BILATERAL LAPAROSCOPIC INGUINAL HERNIA REPAIR WITH MESH AND UMBILICAL HERNIA REPAIR WITH MESH;  Surgeon: Coralie Keens, MD;  Location: [facility];  Service: General;  Laterality: Bilateral;  . LEFT HEART CATH AND CORONARY  ANGIOGRAPHY N/A 06/06/2017   Procedure: LEFT HEART CATH AND CORONARY ANGIOGRAPHY;  Surgeon: Belva Crome, MD;  Location: [facility];  Service: Cardiovascular;  Laterality: N/A;  . LEFT HEART CATHETERIZATION WITH CORONARY ANGIOGRAM N/A 12/22/2011   Procedure: LEFT HEART CATHETERIZATION WITH CORONARY ANGIOGRAM;  Surgeon: Troy Sine, MD;  Location: [facility] CATH LAB;  Service: Cardiovascular;  Laterality: N/A;  . LEFT HEART CATHETERIZATION WITH CORONARY ANGIOGRAM Bilateral 05/16/2012   Procedure: LEFT HEART CATHETERIZATION  WITH CORONARY ANGIOGRAM;  Surgeon: Lorretta Harp, MD;  Location: [facility] CATH LAB;  Service: Cardiovascular;  Laterality: Bilateral;  . LEFT HEART CATHETERIZATION WITH CORONARY ANGIOGRAM N/A 11/14/2012   Procedure: LEFT HEART CATHETERIZATION WITH CORONARY ANGIOGRAM;  Surgeon: Troy Sine, MD;  Location: [facility] CATH LAB;  Service: Cardiovascular;  Laterality: N/A;  . LEFT HEART CATHETERIZATION WITH CORONARY ANGIOGRAM N/A 09/01/2013   Procedure: LEFT HEART CATHETERIZATION WITH CORONARY ANGIOGRAM;  Surgeon: Leonie Man, MD;  Location: [facility] CATH LAB;  Service: Cardiovascular;  Laterality: N/A;  . [place]; 1973; 1985   x 3  . NM MYOCAR PERF WALL MOTION  08/30/2009   No significant ischemia  . OPEN REDUCTION INTERNAL FIXATION (ORIF) DISTAL RADIAL FRACTURE Right 12/10/2016   Procedure: OPEN REDUCTION INTERNAL FIXATION (ORIF) DISTAL RADIAL FRACTURE;  Surgeon: Iran Planas, MD;  Location: [facility];  Service: Orthopedics;  Laterality: Right;  . PERCUTANEOUS CORONARY INTERVENTION-BALLOON ONLY  12/22/2011   Procedure: PERCUTANEOUS CORONARY INTERVENTION-BALLOON ONLY;  Surgeon: Troy Sine, MD;  Location: [facility] CATH LAB;  Service: Cardiovascular;;  . PERCUTANEOUS CORONARY INTERVENTION-BALLOON ONLY  11/14/2012   Procedure: PERCUTANEOUS CORONARY INTERVENTION-BALLOON ONLY;  Surgeon: Troy Sine, MD;  Location: [facility] CATH LAB;  Service: Cardiovascular;;  . PERCUTANEOUS CORONARY STENT INTERVENTION (PCI-S)  05/16/2012   Procedure: PERCUTANEOUS CORONARY STENT INTERVENTION (PCI-S);  Surgeon: Lorretta Harp, MD;  Location: [facility] CATH LAB;  Service: Cardiovascular;;  . PERCUTANEOUS CORONARY STENT INTERVENTION (PCI-S)  09/01/2013   Procedure: PERCUTANEOUS CORONARY STENT INTERVENTION (PCI-S);  Surgeon: Leonie Man, MD;  Location: [facility] CATH LAB;  Service: Cardiovascular;;    There were no vitals filed for this visit.  Subjective Assessment - 03/21/18 1354    Subjective  Pt stated he is okay today, no reports of  pain.  Pt stated he has difficulty completing HEP completely though due to time and seems as though he always has company.  Reports most difficulty currently with stairs due to knee pain.      How long can you sit comfortably?  unlimited    How long can you stand comfortably?  Able to stand for 10 (was several minutes)    How long can you walk comfortably?  Able to walk through [facility] hardware all the way to the back and outdoors, feels improvement (maybe 25 yards before pain incresaes)    Patient Stated Goals  to be able to walk farther without pain    Currently in Pain?  No/denies         [facility] PT Assessment - 03/21/18 0001      Assessment   Medical Diagnosis  Right Hip Pain    Referring Provider  Leafy Kindle, PA    Onset Date/Surgical Date  -- 1-2 years ago    Next MD Visit  04/24/2018 with Rheumatologist 03/28/18 with Cardiologist      Precautions   Precautions  None    Precaution Comments  Patient has Unstable Angina - carries Nitroglycerin with him  Observation/Other Assessments   Other Surveys   Other Surveys    Lower Extremity Functional Scale   26/80 was 20/80 = functioning at 25% of maximal abilities      Functional Tests   Functional tests  Squat;Step down;Single leg stance      Squat   Comments  Decreased weight shift to Rt hip and pain with 5 reps      Single Leg Stance   Comments  Max of 3 Lt: 5, 2 and 3"; Rt 11, 9", and 9"      Posture/Postural Control   Posture/Postural Control  Postural limitations    Postural Limitations  Forward head;Rounded Shoulders;Increased lumbar lordosis      ROM / Strength   AROM / PROM / Strength  Strength;AROM      AROM   AROM Assessment Site  Hip    Right/Left Hip  Right;Left    Right Hip Extension  8 was 5    Right Hip Flexion  105 was 98    Right Hip External Rotation   42 was 35    Right Hip Internal Rotation   35 was 15    Left Hip Extension  10 was 8    Left Hip Flexion  100 was 93    Left Hip External Rotation    44 was 36    Left Hip Internal Rotation   30 was 28      Strength   Strength Assessment Site  Hip;Knee    Right/Left Hip  Right;Left    Right Hip Flexion  4+/5 was 4/5    Right Hip Extension  4/5 was 4/5    Right Hip ABduction  4+/5 was 4/5 able to complete 7/11 pain free    Left Hip Flexion  4+/5 was 4/5    Left Hip Extension  4/5 was 4/5    Left Hip ABduction  4+/5 was 4/5    Right/Left Knee  Right;Left    Right Knee Flexion  4+/5 was 4-/5    Right Knee Extension  -- was 4+/5    Left Knee Flexion  4/5 was 3+/5    Left Knee Extension  -- was 4+/5    Right Ankle Dorsiflexion  -- was 4+/5    Left Ankle Dorsiflexion  -- was 4+/5      Flexibility   Soft Tissue Assessment /Muscle Length  yes    Hamstrings  Rt: 90/155; Lt 90/145 was Bil LE: 90/145      Transfers   Five time sit to stand comments   12.81" was 18.5 without UE support      Ambulation/Gait   Ambulation Distance (Feet)  345 Feet 2MWT    Gait velocity  .8763 m/s                   [facility] Adult PT Treatment/Exercise - 03/21/18 0001      Knee/Hip Exercises: Stretches   Piriformis Stretch  Both;3 reps;30 seconds;Limitations    Piriformis Stretch Limitations  supine       Knee/Hip Exercises: Standing   Heel Raises  Limitations;2 sets;10 reps    Heel Raises Limitations  Toe raises    Hip Extension  Stengthening;Both;2 sets;15 reps;Knee straight;Limitations    Forward Step Up  Both;10 reps;Hand Hold: 0;Step Height: 6"    SLS  Rt 11", Lt 5" max     Other Standing Knee Exercises  Side stepping wtih Red theraband at knees, 3x 15" roundtrips  PT Short Term Goals - 03/21/18 1408      PT SHORT TERM GOAL #1   Title  Patient will be independent with HEP, updated PRN, to improve functional mobility, strength to improve gait    Baseline  03/21/2018: reports partial compliance with HEP       PT SHORT TERM GOAL #2   Title  Patient will improve Rt hip ROM for internal rotation by 8 degrees or  more to demonstrate significant improvement in functional ROM.    Baseline  03/21/2018: see ROM measurements    Status  On-going      PT SHORT TERM GOAL #3   Title  Patient will improve 5x sit to stand time by 4 seconds to demonstrate significant improvement in bil LE strength to be able to perform functional mobility with improved ease/quality.    Baseline  Eval: 18.5"; 03/21/18: 12.81"    Status  Achieved      PT SHORT TERM GOAL #4   Title  Patient will perform SLS for 10 seonds on Bil LE to demonstrate improved balance    Baseline  7/11: max of 3 Lt 5", 2", 3"; Rt 11", 9", 9"     Status  On-going        PT Long Term Goals - 03/21/18 1849      PT LONG TERM GOAL #1   Title  Patient will perform SLS for 15 seonds on Bil LE to demonstrate improved balance    Time  6    Period  Weeks    Status  On-going      PT LONG TERM GOAL #2   Title  Patient will perform step down test from 6" step, 5x with 1x UE support and no pain to demonstrate improved functional LE strength and tolerance to endurance activity    Baseline  7/11: not assessed this session; reports of discomfort and fatigue with step up training this session      PT [place] #3   Title  Patient will perform 2 MWT at 1.0 m/s or faster with no increase in Rt hip pain and normalized gait pattern.    Baseline  7/11: .8763 m/s    Status  On-going      PT LONG TERM GOAL #4   Title  Patient will improve LEFS score by 9 points or greater to demonstrate significant improvement in self reported function.    Baseline  eval: 20/80; 7/11 26/80    Status  On-going      PT LONG TERM GOAL #5   Title  Patient will improve MMT for bil LE by 1/2 grade or more to demonstrate improved LE strength to improve mobility and gait quality    Status  On-going            Plan - 03/21/18 1841    Clinical Impression Statement  Reviewed goals this session with the following findings:  Pt presents with some improvements with hip mobility  and strengthening.  Does continue to have deficits with strength, ROM, gait velocity and balance.  EOS pt limted by fatigue, no reports of pain.  Improved self perceived functional ability with LEFS 26/80 (was 20/80 eval).      Clinical Impairments Affecting Rehab Potential  (-) financial limitations    PT Frequency  1x / week    PT Duration  6 weeks    PT Treatment/Interventions  ADLs/Self Care Home Management;Aquatic Therapy;Electrical Stimulation;Cryotherapy;DME Instruction;Gait training;Stair training;Functional mobility training;Therapeutic activities;Therapeutic exercise;Balance training;Neuromuscular  re-education;Patient/family education;Manual techniques;Passive range of motion    PT Next Visit Plan  Continue hip strengthening and balance activities. Perform manual therapy to mobilize Rt hip joint. Provide hip stretch for hamstring and internal/external hip rotators.    PT Home Exercise Plan  Eval: bridges, hip hike on step; 02/28/18 - discontinue bridge, added hip ext wtih band, SLS at counter;        Patient will benefit from skilled therapeutic intervention in order to improve the following deficits and impairments:  Abnormal gait, Increased fascial restricitons, Impaired sensation, Improper body mechanics, Pain, Decreased mobility, Postural dysfunction, Decreased activity tolerance, Decreased endurance, Decreased strength, Decreased balance, Difficulty walking, Decreased range of motion, Hypomobility, Impaired flexibility, Obesity  Visit Diagnosis: Pain in right hip  Stiffness of right hip, not elsewhere classified  Other abnormalities of gait and mobility     Problem List Patient Active Problem List   Diagnosis Date Noted  . Dysphagia 01/02/2018  . Diarrhea 01/02/2018  . Family history of colon cancer 01/02/2018  . Ischemic cardiomyopathy   . Insulin dependent diabetes mellitus ([place])   . Hypertension   . History of MI (myocardial infarction)   . GERD (gastroesophageal  reflux disease)   . Full dentures   . Coronary artery disease   . Anxiety   . Acute ST elevation myocardial infarction (STEMI) of inferior wall ([place]) 06/06/2017  . Varicose veins of left lower extremity with complications 18/84/1660  . Cellulitis of left leg without foot   . Right radial fracture 12/11/2016  . T3 vertebral fracture ([place]) 12/11/2016  . Fall 12/09/2016  . Thrombocytopenia ([place]) 10/09/2016  . Left leg cellulitis 10/08/2016  . Diabetes ([place]) 10/08/2016  . Inguinal hernia 05/12/2016  . Incarcerated umbilical hernia 63/09/6008  . Chronic combined systolic and diastolic CHF (congestive heart failure) ([place]) 04/25/2016  . High cholesterol   . Type II diabetes mellitus ([place])   . Essential hypertension   . Non-STEMI (non-ST elevated myocardial infarction) ([place])   . CAD S/P percutaneous coronary angioplasty   . Unstable angina ([place]) 03/20/2015  . ICM-EF 35% at cath 02/01/15 02/02/2015  . CAD (coronary artery disease) 01/09/2015  . DM type 2 causing vascular disease, not at goal [facility]) 05/29/2014  . Dyspnea 10/20/2013  . COPD (chronic obstructive pulmonary disease) ([place]) 09/03/2013  . Hyperlipidemia   . Old myocardial infarction 09/01/2013  . Abnormal nuclear stress test 05/17/2012  . S/P CFX PTCAI for ISR 02/01/15 05/17/2012  . PVD, chronic LLE 12/22/2011  . Rheumatoid arthritis ([place]) 12/22/2011  . Contrast media allergy 12/22/2011  . Tobacco abuse 12/21/2011   Ihor Austin, [place]; [place]  Aldona Lento 03/21/2018, 6:56 PM  [place] [address] [place], [place], [address] Phone: [phone]   Fax:  [phone]  Name: Robert [name] MRN: [id number] Date of Birth: 06-Oct-1950

## 2018-03-27 ENCOUNTER — Telehealth (HOSPITAL_COMMUNITY): Payer: Self-pay | Admitting: Internal Medicine

## 2018-03-27 NOTE — Telephone Encounter (Signed)
03/27/18  pt called to cx and I told him it was his last appt and he said that he would call back to reschedule

## 2018-03-28 ENCOUNTER — Encounter (HOSPITAL_COMMUNITY): Payer: Medicare HMO

## 2018-03-28 ENCOUNTER — Encounter: Payer: Self-pay | Admitting: Cardiovascular Disease

## 2018-03-28 ENCOUNTER — Ambulatory Visit: Payer: Medicare HMO | Admitting: Cardiovascular Disease

## 2018-03-28 VITALS — BP 120/82 | HR 74 | Ht 72.0 in | Wt 246.0 lb

## 2018-03-28 DIAGNOSIS — J449 Chronic obstructive pulmonary disease, unspecified: Secondary | ICD-10-CM | POA: Diagnosis not present

## 2018-03-28 DIAGNOSIS — Z794 Long term (current) use of insulin: Secondary | ICD-10-CM

## 2018-03-28 DIAGNOSIS — I739 Peripheral vascular disease, unspecified: Secondary | ICD-10-CM

## 2018-03-28 DIAGNOSIS — I5042 Chronic combined systolic (congestive) and diastolic (congestive) heart failure: Secondary | ICD-10-CM

## 2018-03-28 DIAGNOSIS — E1151 Type 2 diabetes mellitus with diabetic peripheral angiopathy without gangrene: Secondary | ICD-10-CM | POA: Diagnosis not present

## 2018-03-28 DIAGNOSIS — M069 Rheumatoid arthritis, unspecified: Secondary | ICD-10-CM

## 2018-03-28 DIAGNOSIS — E785 Hyperlipidemia, unspecified: Secondary | ICD-10-CM | POA: Diagnosis not present

## 2018-03-28 DIAGNOSIS — I10 Essential (primary) hypertension: Secondary | ICD-10-CM | POA: Diagnosis not present

## 2018-03-28 DIAGNOSIS — I25118 Atherosclerotic heart disease of native coronary artery with other forms of angina pectoris: Secondary | ICD-10-CM | POA: Diagnosis not present

## 2018-03-28 MED ORDER — CLOPIDOGREL BISULFATE 75 MG PO TABS: 75.0000 mg | ORAL_TABLET | Freq: Every day | ORAL | 3 refills | Status: DC

## 2018-03-28 NOTE — Patient Instructions (Signed)
Dr Sallyanne Kuster has recommended making the following medication changes: 1. STOP Brilinta 2. START Clopidogrel 75 mg daily  Your physician recommends that you schedule a follow-up appointment in 6 months. You will receive a reminder letter in the mail two months in advance. If you don't receive a letter, please call our office to schedule the follow-up appointment.  If you need a refill on your cardiac medications before your next appointment, please call your pharmacy.

## 2018-03-28 NOTE — Progress Notes (Signed)
Cardiology Office Note    Date:  03/29/2018   ID:  Robert [name], DOB Oct 28, 1950, MRN [id number]  PCP:  Redmond School, MD  Cardiologist:   Sanda Klein, MD   Chief complaint: CAD   History of Present Illness:  Robert [name] is a 67 y.o. male with premature onset coronary artery disease and frequent problems with in-stent restenosis after multiple interventions in the left circumflex coronary artery, most recently presenting with late stent thrombosis in September 2018 with non-ST segment elevation myocardial infarction, treated with plain angioplasty without placement of new stent.  He is also known to have chronic total occlusion of the right coronary artery.  He has a 60% stenosis in the proximal LAD and a 60% stenosis in the ostial first diagonal artery.  He has not had any problems with angina pectoris since his last appointment.  He has mild exertional dyspnea, functional class II, unchanged he has some bilateral lower extremity swelling towards the end of the day, usually a little worse on the left side where he has had previous vein stripping and cellulitis.  He complains about the high cost of Brilinta.  He really cannot afford it.  In the past he took clopidogrel and developed a mild pruritic rash.  He is not entirely sure that the clopidogrel was necessarily the culprit.  He would like to try to switch again.  He is not smoking.    Diabetes control is better according to his report.  His last hemoglobin A1c was 9.1% but this was back in September.  Since then he states that his sugars have generally been in the 110-120s.  He did try to take Invokana but developed a "riproaring urinary tract infection".    His blood pressure is always in normal range.    His most recent lipid profile was also from last September.  He has an exceptionally low LDL at 28, but his HDL is also stubbornly low at 29.  He is still enrolled in a clinical trial with dalcetrapib.  Bypass surgery was  considered during this most recent hospitalization.  It was felt that he would likely not benefit of this procedure since the distal right coronary artery was a poor target, the left circumflex coronary artery territory is mostly infarcted and the LAD stenosis is not critical.  In addition he developed severe thrombocytopenia during that hospital stay (platelets down to 38,000), possibly due to the use of Aggrastat.  By the time of hospital discharge his platelet count had recovered slightly to 58,000.  An additional disincentive to bypass surgery the fact that he has undergone left leg vein stripping, although he still appears to have adequate veins for bypass in his right lower extremity. Left ventricular ejection fraction has dropped as a result of his most recent infarction, now down to 40-45%.  At the time of left heart catheterization LVEDP was mildly increased to 19 mmHg. Additional medical problems include PAD, insulin requiring diabetes mellitus, dyslipidemia with very low HDL and rheumatoid arthritis.  He has been managed for years with methotrexate, which may also be partly responsible for tendency to pancytopenia), but now is on Actemra monotherapy with excellent clinical response.   summary of coronary interventions: -cath 2004 showed total occlusion of RCA and intermediate lesions LAD, high grade mid LCX treated with 4x18 mm BMS (Velocity).  - 2006 overlapping 3.5x23 mm Cypher inLCX  - 2007 new downstream stenosis at OM bifurcation treated with 3x18 mm Cypher stent  - 2008  acute stent thrombosis (allergic to plavix, temporarily stopped Ticlid) - thrombolysis/aspiration only  - April 2013 cutting balloon angioplasty for proximal LCX in stent restenosis (Dr. Claiborne Billings)  - September 2013 repeat restenosis treated with "stent in stent" Xience Expedition 3.25 x 23 mm (Dr. Gwenlyn Found).  - March 2014 left circumflex coronary artery PCI for restenosis, utilizing cutting balloon atherotomy/noncompliant  balloon dilatation  - December 2014  Thrombotic occlusion of restenotic proximal left circumflex stent , new Promus Premier DES 3.5 mm x 24 mm; overlapping the proximal and distal stents -  May 2016 cutting balloon angioplasty for in-stent restenosis in the proximal left circumflex coronary artery -  July 2016 non STEMI ,  coronary angiogram unchanged,no intervention performed -  September 2018 non-STEMI, acute late thrombosis of the left circumflex stent, treated with angioplasty alone (Dr. Tamala Julian) -  No change in RCA or LAD anatomy during last several years.  -  Allergic to iodinated contrast, but did well with premedication. Allergic to clopidogrel but tolerates Brilinta and Effient well.   Past Medical History:  Diagnosis Date  . Anxiety   . COPD (chronic obstructive pulmonary disease) ([place])   . Coronary artery disease   . Full dentures   . GERD (gastroesophageal reflux disease)    Rolaids as needed  . High cholesterol   . History of MI (myocardial infarction)   . Hypertension    med. dosage increased 04/2016; has been on BP med. x 10 yrs.  . Incarcerated umbilical hernia 61/6073  . Inguinal hernia 05/2016   bilateral   . Insulin dependent diabetes mellitus ([place])   . Ischemic cardiomyopathy    a. 03/2015 EF 40-45% by LV gram.  . Left leg cellulitis 10/08/2016  . Rheumatoid arthritis(714.0)     Past Surgical History:  Procedure Laterality Date  . CARDIAC CATHETERIZATION N/A 02/01/2015   Procedure: Left Heart Cath and Coronary Angiography;  Surgeon: Belva Crome, MD;  Location: [place] [facility];  Service: Cardiovascular;  Laterality: N/A;  . CARDIAC CATHETERIZATION N/A 03/22/2015   Procedure: Left Heart Cath and Coronary Angiography;  Surgeon: Leonie Man, MD;  Location: [place] [facility];  Service: Cardiovascular;  Laterality: N/A;  . CARDIAC CATHETERIZATION  09/05/2002; 03/09/2003; 04/10/2005;06/02/2005; 05/09/2006; 02/09/2007; 03/01/2007  . COLONOSCOPY  2008   Dr. Gala Romney:  diverticulosis, hyperplastic polyp.  . CORONARY ANGIOPLASTY  11/14/2012; 02/01/2015  . CORONARY ANGIOPLASTY WITH STENT PLACEMENT  05/16/2012   "1; makes total ~ 4"  . CORONARY STENT INTERVENTION N/A 06/06/2017   Procedure: CORONARY STENT INTERVENTION;  Surgeon: Belva Crome, MD;  Location: [place] [facility];  Service: Cardiovascular;  Laterality: N/A;  . INSERTION OF MESH Bilateral 05/24/2016   Procedure: INSERTION OF MESH;  Surgeon: Coralie Keens, MD;  Location: [place];  Service: General;  Laterality: Bilateral;  . LAPAROSCOPIC CHOLECYSTECTOMY    . LAPAROSCOPIC INGUINAL HERNIA WITH UMBILICAL HERNIA Bilateral 05/24/2016   Procedure: BILATERAL LAPAROSCOPIC INGUINAL HERNIA REPAIR WITH MESH AND UMBILICAL HERNIA REPAIR WITH MESH;  Surgeon: Coralie Keens, MD;  Location: [place];  Service: General;  Laterality: Bilateral;  . LEFT HEART CATH AND CORONARY ANGIOGRAPHY N/A 06/06/2017   Procedure: LEFT HEART CATH AND CORONARY ANGIOGRAPHY;  Surgeon: Belva Crome, MD;  Location: [place] [facility];  Service: Cardiovascular;  Laterality: N/A;  . LEFT HEART CATHETERIZATION WITH CORONARY ANGIOGRAM N/A 12/22/2011   Procedure: LEFT HEART CATHETERIZATION WITH CORONARY ANGIOGRAM;  Surgeon: Troy Sine, MD;  Location: [facility] CATH LAB;  Service: Cardiovascular;  Laterality: N/A;  . LEFT HEART CATHETERIZATION WITH CORONARY ANGIOGRAM Bilateral 05/16/2012   Procedure: LEFT HEART CATHETERIZATION WITH CORONARY ANGIOGRAM;  Surgeon: Lorretta Harp, MD;  Location: [facility] CATH LAB;  Service: Cardiovascular;  Laterality: Bilateral;  . LEFT HEART CATHETERIZATION WITH CORONARY ANGIOGRAM N/A 11/14/2012   Procedure: LEFT HEART CATHETERIZATION WITH CORONARY ANGIOGRAM;  Surgeon: Troy Sine, MD;  Location: [facility] CATH LAB;  Service: Cardiovascular;  Laterality: N/A;  . LEFT HEART CATHETERIZATION WITH CORONARY ANGIOGRAM N/A 09/01/2013   Procedure: LEFT HEART CATHETERIZATION WITH CORONARY ANGIOGRAM;   Surgeon: Leonie Man, MD;  Location: [facility] CATH LAB;  Service: Cardiovascular;  Laterality: N/A;  . [place]; 1973; 1985   x 3  . NM MYOCAR PERF WALL MOTION  08/30/2009   No significant ischemia  . OPEN REDUCTION INTERNAL FIXATION (ORIF) DISTAL RADIAL FRACTURE Right 12/10/2016   Procedure: OPEN REDUCTION INTERNAL FIXATION (ORIF) DISTAL RADIAL FRACTURE;  Surgeon: Iran Planas, MD;  Location: [place];  Service: Orthopedics;  Laterality: Right;  . PERCUTANEOUS CORONARY INTERVENTION-BALLOON ONLY  12/22/2011   Procedure: PERCUTANEOUS CORONARY INTERVENTION-BALLOON ONLY;  Surgeon: Troy Sine, MD;  Location: [facility] CATH LAB;  Service: Cardiovascular;;  . PERCUTANEOUS CORONARY INTERVENTION-BALLOON ONLY  11/14/2012   Procedure: PERCUTANEOUS CORONARY INTERVENTION-BALLOON ONLY;  Surgeon: Troy Sine, MD;  Location: [facility] CATH LAB;  Service: Cardiovascular;;  . PERCUTANEOUS CORONARY STENT INTERVENTION (PCI-S)  05/16/2012   Procedure: PERCUTANEOUS CORONARY STENT INTERVENTION (PCI-S);  Surgeon: Lorretta Harp, MD;  Location: [facility] CATH LAB;  Service: Cardiovascular;;  . PERCUTANEOUS CORONARY STENT INTERVENTION (PCI-S)  09/01/2013   Procedure: PERCUTANEOUS CORONARY STENT INTERVENTION (PCI-S);  Surgeon: Leonie Man, MD;  Location: [facility] CATH LAB;  Service: Cardiovascular;;    Current Medications: Outpatient Medications Prior to Visit  Medication Sig Dispense Refill  . acarbose (PRECOSE) 50 MG tablet Take 50 mg by mouth 3 (three) times daily with meals.     [name] acetaminophen (TYLENOL) 650 MG CR tablet Take 650 mg by mouth every 8 (eight) hours as needed for pain.    [name] ALPRAZolam (XANAX) 0.5 MG tablet Take 0.5 mg by mouth 3 (three) times daily as needed for anxiety.     . AMBULATORY NON FORMULARY MEDICATION Take 600 mg by mouth daily at 12 noon. Medication Name: Dalcetrapib vs placebo (Patient taking differently: Take 600 mg by mouth every evening. Medication Name: Dalcetrapib vs placebo: Cholesterol)      . aspirin EC 81 MG EC tablet Take 1 tablet (81 mg total) by mouth daily.    [name] atorvastatin (LIPITOR) 80 MG tablet TAKE 1 TABLET DAILY AT 6 PM. 90 tablet 3  . fish oil-omega-3 fatty acids 1000 MG capsule Take 1 g by mouth daily.     . folic acid (FOLVITE) 1 MG tablet Take 1 mg by mouth daily.     . furosemide (LASIX) 40 MG tablet Take 2 tablets (80 mg total) by mouth daily. 180 tablet 3  . gabapentin (NEURONTIN) 300 MG capsule Take 300 mg by mouth 3 (three) times daily as needed (for pain).     [name] glipiZIDE (GLUCOTROL) 10 MG tablet Take 10 mg by mouth 2 (two) times daily.    . Insulin Glargine (LANTUS SOLOSTAR) 100 UNIT/ML Solostar Pen Inject 80 Units into the skin daily at 10 pm.    . isosorbide mononitrate (IMDUR) 120 MG 24 hr tablet TAKE 1 TABLET EVERY DAY 90 tablet 3  . loratadine (CLARITIN) 10 MG tablet Take 1  tablet (10 mg total) by mouth daily. 30 tablet 0  . losartan (COZAAR) 25 MG tablet Take 0.5 tablets (12.5 mg total) by mouth daily. 30 tablet 6  . magnesium oxide (MAG-OX) 400 MG tablet Take 400 mg by mouth 2 (two) times daily.    . metFORMIN (GLUCOPHAGE) 1000 MG tablet Take 1 tablet (1,000 mg total) by mouth 2 (two) times daily with a meal. (Patient taking differently: Take 1,000 mg by mouth 2 (two) times daily with a meal. )    . methotrexate 50 MG/2ML injection Inject 2 mLs into the skin once a week. Takes every Thursday    . metoprolol tartrate (LOPRESSOR) 25 MG tablet TAKE 1 TABLET TWICE DAILY 180 tablet 3  . nitroGLYCERIN (NITROSTAT) 0.4 MG SL tablet Place 1 tablet (0.4 mg total) under the tongue every 5 (five) minutes x 3 doses as needed for chest pain. 25 tablet 2  . oxyCODONE (OXY IR/ROXICODONE) 5 MG immediate release tablet Take 1 tablet (5 mg total) by mouth every 4 (four) hours as needed for moderate pain. 12 tablet 0  . potassium chloride SA (K-DUR,KLOR-CON) 20 MEQ tablet Take 2 tablets on day one and then 1 tablet by mouth daily thereafter. (Patient taking differently: Take  2 tablets on day one and then 1 tablet by mouth daily thereafter. Doesn't take everyday) 30 tablet 5  . primidone (MYSOLINE) 50 MG tablet Take 100 mg by mouth daily.     . Tocilizumab 162 MG/0.9ML SOSY Inject 162 mg into the skin every Monday. ACTEMRA    . BRILINTA 90 MG TABS tablet TAKE (1) TABLET BY MOUTH TWICE DAILY. 120 tablet 0   No facility-administered medications prior to visit.      Allergies:   Ivp dye [iodinated diagnostic agents]   Social History   Socioeconomic History  . Marital status: Married    Spouse name: Not on file  . Number of children: Not on file  . Years of education: Not on file  . Highest education level: Not on file  Occupational History  . Occupation: Clinical biochemist  Social Needs  . Financial resource strain: Not on file  . Food insecurity:    Worry: Not on file    Inability: Not on file  . Transportation needs:    Medical: Not on file    Non-medical: Not on file  Tobacco Use  . Smoking status: Current Some Day Smoker    Packs/day: 0.50    Types: Cigarettes    Last attempt to quit: 06/11/2015    Years since quitting: 2.8  . Smokeless tobacco: Never Used  . Tobacco comment: pt states smoking less than half a pack  Substance and Sexual Activity  . Alcohol use: No  . Drug use: No  . Sexual activity: Yes  Lifestyle  . Physical activity:    Days per week: Not on file    Minutes per session: Not on file  . Stress: Not on file  Relationships  . Social connections:    Talks on phone: Not on file    Gets together: Not on file    Attends religious service: Not on file    Active member of club or organization: Not on file    Attends meetings of clubs or organizations: Not on file    Relationship status: Not on file  Other Topics Concern  . Not on file  Social History Narrative  . Not on file     Family History:  The patient's  family history includes Colon cancer in his brother and mother; Heart disease in his father and mother;  Hypertension in his mother; Lung cancer in his mother.   ROS:   Please see the history of present illness.    ROS All other systems reviewed and are negative.   PHYSICAL EXAM:   VS:  BP 120/82 (BP Location: Left Arm, Patient Position: Sitting)   Pulse 74   Ht 6' (1.829 m)   Wt 246 lb (111.6 kg)   BMI 33.36 kg/m     General: Alert, oriented x3, no distress, mildly obese Head: no evidence of trauma, PERRL, EOMI, no exophtalmos or lid lag, no myxedema, no xanthelasma; normal ears, nose and oropharynx Neck: normal jugular venous pulsations and no hepatojugular reflux; brisk carotid pulses without delay and no carotid bruits Chest: Diminished breath sounds throughout, but otherwise clear to auscultation, no signs of consolidation by percussion or palpation, normal fremitus, symmetrical and full respiratory excursions Cardiovascular: normal position and quality of the apical impulse, regular rhythm, normal first and second heart sounds, no murmurs, rubs or gallops Abdomen: no tenderness or distention, no masses by palpation, no abnormal pulsatility or arterial bruits, normal bowel sounds, no hepatosplenomegaly Extremities: no clubbing, cyanosis or edema; 2+ radial, ulnar and brachial pulses bilaterally; 2+ right femoral, posterior tibial and dorsalis pedis pulses; 2+ left femoral, posterior tibial and dorsalis pedis pulses; no subclavian or femoral bruits Neurological: grossly nonfocal Psych: Normal mood and affect    Wt Readings from Last 3 Encounters:  03/28/18 246 lb (111.6 kg)  01/02/18 242 lb 9.6 oz (110 kg)  09/25/17 240 lb (108.9 kg)      Studies/Labs Reviewed:   EKG:  EKG is ordered today.  Shows normal sinus rhythm, old inferoposterior MI (tall R in V2), minor T wave inversion in leads V6, 2, 3, aVF.  QTc 468 ms  Recent Labs: 06/07/2017: ALT 30 09/25/2017: BUN 17; Creatinine, Ser 0.95; Hemoglobin 16.3; NT-Pro BNP 396; Platelets 159; Potassium 4.6; Sodium 137   Lipid Panel     Component Value Date/Time   CHOL 85 06/07/2017 0002   TRIG 142 06/07/2017 0002   HDL 29 (L) 06/07/2017 0002   CHOLHDL 2.9 06/07/2017 0002   VLDL 28 06/07/2017 0002   LDLCALC 28 06/07/2017 0002   ASSESSMENT:    1. Coronary artery disease of native artery of native heart with stable angina pectoris ([place])   2. Chronic combined systolic and diastolic heart failure ([place])   3. PAD (peripheral artery disease) ([place])   4. Essential hypertension   5. Chronic obstructive pulmonary disease, unspecified COPD type ([place])   6. Dyslipidemia (high LDL; low HDL)   7. Controlled type 2 diabetes mellitus with diabetic peripheral angiopathy without gangrene, with long-term current use of insulin ([place])   8. Rheumatoid arthritis, involving unspecified site, unspecified rheumatoid factor presence ([place])      PLAN:  In order of problems listed above:  1. CAD: Asymptomatic.  He does not have angina pectoris while taking long-acting nitrates in a fairly high dose as well as beta-blockers.  He has probably infarcted most of the posterior wall.  Technically, we have not yet reached beyond the possibility of restenosis following his angioplasty in September.  We again discussed the disadvantages of performing open heart surgery due to perceived low benefit (likely low chance of revascularizing the right coronary artery; risk of atresia of the mammary artery if it is placed to a noncritically occluded LAD).  So far doing well  with medical therapy.  After we discussed it, we will try to switch him back to clopidogrel, hoping that he does not develop a rash again.  If this occurs, we will have to find some financial assistance for Brilinta. 2. CHF: Appears clinically euvolemic.  The swelling in his ankles could well be related to COPD and cor pulmonale, not to left heart failure he has always had some degree of shortness of breath related to smoking and COPD.  I see no reason to adjust his diuretic dose today.  Pro BNP was  approximately 400 in January 2019, when he appeared to be euvolemic.  Is concerned as a baseline. 3. PAD: Denies problems with intermittent claudication.  He has known chronic occlusion of the left popliteal artery with collateral formation and three-vessel runoff.  Normal right ABI.  Left ABI 0.61 (September 2018).  This was evaluated by one of our vascular specialists and felt to be best suited for medical therapy.  September 2018 bilateral carotid artery duplex ultrasound was normal.. 4. HTN: well controlled 5. COPD: I suspect this is the most important contributor to his exertional dyspnea, but cannot exclude contribution of heart failure.  Not wheezing today.  Breath sounds are rather distant 6. HLP: Excellent LDL.  HDL low.  In clinical trial. 7. DM: Complicated by neuropathy and peripheral angiopathy. This puts him at risk for progression of coronary and peripheral artery disease.  He had complications when taking Invokana.  It would still be desirable to have him on 1 of the more modern diabetes agents that improve cardiovascular outcomes.  If he cannot tolerate an SGLT2 inhibitor, consider using Victoza or another GLP-1 agonist. 8. RA: on Actemra, MTX may have been partly responsible for thrombocytopenia. 9. Thrombocytopenia: Resolved on last labs   Medication Adjustments/Labs and Tests Ordered: Current medicines are reviewed at length with the patient today.  Concerns regarding medicines are outlined above.  Medication changes, Labs and Tests ordered today are listed in the Patient Instructions below. Patient Instructions  Dr Sallyanne Kuster has recommended making the following medication changes: 1. STOP Brilinta 2. START Clopidogrel 75 mg daily  Your physician recommends that you schedule a follow-up appointment in 6 months. You will receive a reminder letter in the mail two months in advance. If you don't receive a letter, please call our office to schedule the follow-up appointment.  If  you need a refill on your cardiac medications before your next appointment, please call your pharmacy.    Signed, Sanda Klein, MD  03/29/2018 4:39 PM    [address], [place], [place]  [address] Phone: [phone]; Fax: [phone]

## 2018-03-29 DIAGNOSIS — E785 Hyperlipidemia, unspecified: Secondary | ICD-10-CM | POA: Insufficient documentation

## 2018-04-24 DIAGNOSIS — Z79899 Other long term (current) drug therapy: Secondary | ICD-10-CM | POA: Diagnosis not present

## 2018-04-24 DIAGNOSIS — M2550 Pain in unspecified joint: Secondary | ICD-10-CM | POA: Diagnosis not present

## 2018-04-24 DIAGNOSIS — M25551 Pain in right hip: Secondary | ICD-10-CM | POA: Diagnosis not present

## 2018-04-24 DIAGNOSIS — Z6832 Body mass index (BMI) 32.0-32.9, adult: Secondary | ICD-10-CM | POA: Diagnosis not present

## 2018-04-24 DIAGNOSIS — M5441 Lumbago with sciatica, right side: Secondary | ICD-10-CM | POA: Diagnosis not present

## 2018-04-24 DIAGNOSIS — E669 Obesity, unspecified: Secondary | ICD-10-CM | POA: Diagnosis not present

## 2018-04-24 DIAGNOSIS — M0579 Rheumatoid arthritis with rheumatoid factor of multiple sites without organ or systems involvement: Secondary | ICD-10-CM | POA: Diagnosis not present

## 2018-04-26 ENCOUNTER — Ambulatory Visit: Payer: Medicare HMO | Admitting: Gastroenterology

## 2018-05-01 DIAGNOSIS — I10 Essential (primary) hypertension: Secondary | ICD-10-CM | POA: Diagnosis not present

## 2018-05-01 DIAGNOSIS — E1165 Type 2 diabetes mellitus with hyperglycemia: Secondary | ICD-10-CM | POA: Diagnosis not present

## 2018-05-01 DIAGNOSIS — Z6832 Body mass index (BMI) 32.0-32.9, adult: Secondary | ICD-10-CM | POA: Diagnosis not present

## 2018-05-01 DIAGNOSIS — M0689 Other specified rheumatoid arthritis, multiple sites: Secondary | ICD-10-CM | POA: Diagnosis not present

## 2018-05-01 DIAGNOSIS — E669 Obesity, unspecified: Secondary | ICD-10-CM | POA: Diagnosis not present

## 2018-05-17 DIAGNOSIS — M25551 Pain in right hip: Secondary | ICD-10-CM | POA: Diagnosis not present

## 2018-05-17 DIAGNOSIS — M7061 Trochanteric bursitis, right hip: Secondary | ICD-10-CM | POA: Diagnosis not present

## 2018-06-07 DIAGNOSIS — Z1389 Encounter for screening for other disorder: Secondary | ICD-10-CM | POA: Diagnosis not present

## 2018-06-07 DIAGNOSIS — J449 Chronic obstructive pulmonary disease, unspecified: Secondary | ICD-10-CM | POA: Diagnosis not present

## 2018-06-07 DIAGNOSIS — Z23 Encounter for immunization: Secondary | ICD-10-CM | POA: Diagnosis not present

## 2018-06-07 DIAGNOSIS — E782 Mixed hyperlipidemia: Secondary | ICD-10-CM | POA: Diagnosis not present

## 2018-06-07 DIAGNOSIS — Z6832 Body mass index (BMI) 32.0-32.9, adult: Secondary | ICD-10-CM | POA: Diagnosis not present

## 2018-06-07 DIAGNOSIS — I10 Essential (primary) hypertension: Secondary | ICD-10-CM | POA: Diagnosis not present

## 2018-06-07 DIAGNOSIS — Z0001 Encounter for general adult medical examination with abnormal findings: Secondary | ICD-10-CM | POA: Diagnosis not present

## 2018-06-13 DIAGNOSIS — E109 Type 1 diabetes mellitus without complications: Secondary | ICD-10-CM | POA: Diagnosis not present

## 2018-06-13 DIAGNOSIS — M7061 Trochanteric bursitis, right hip: Secondary | ICD-10-CM | POA: Diagnosis not present

## 2018-06-13 DIAGNOSIS — M25551 Pain in right hip: Secondary | ICD-10-CM | POA: Diagnosis not present

## 2018-06-20 ENCOUNTER — Encounter (HOSPITAL_COMMUNITY): Payer: Self-pay

## 2018-06-20 NOTE — Therapy (Signed)
[place], [place], [address] Phone: [phone]   Fax:  [phone]  Patient Details  Name: Robert [name] MRN: [id number] Date of Birth: 1950/09/26 Referring Provider:  No ref. provider found  Encounter Date: 06/20/2018   PHYSICAL THERAPY DISCHARGE SUMMARY  Visits from Start of Care: 4  Current functional level related to goals / functional outcomes: Patient is being discharged from this episode of therapy as he has not returned since his last visit. He cancelled his last visit and declined to reschedule, stating he would call back to do so. He was seen on 03/21/18 for a re-assessment and below are the goal status and clinical impression from that visit.         Plan - 03/21/18 1841    Clinical Impression Statement  Reviewed goals this session with the following findings:  Pt presents with some improvements with hip mobility and strengthening.  Does continue to have deficits with strength, ROM, gait velocity and balance.  EOS pt limted by fatigue, no reports of pain.  Improved self perceived functional ability with LEFS 26/80 (was 20/80 eval).      - Ihor Austin, LPTA, CBIS Remaining deficits: PT SHORT TERM GOAL #2    Title  Patient will improve Rt hip ROM for internal rotation by 8 degrees or more to demonstrate significant improvement in functional ROM.    Baseline  03/21/2018: see ROM measurements    Status  On-going        PT SHORT TERM GOAL #3   Title  Patient will improve 5x sit to stand time by 4 seconds to demonstrate significant improvement in bil LE strength to be able to perform functional mobility with improved ease/quality.    Baseline  Eval: 18.5"; 03/21/18: 12.81"    Status  Achieved        PT SHORT TERM GOAL #4   Title  Patient will perform SLS for 10 seonds on Bil LE to demonstrate improved balance    Baseline  7/11: max of 3 Lt 5", 2", 3"; Rt 11", 9", 9"     Status  On-going            PT Long Term Goals - 03/21/18 1849            PT LONG TERM GOAL #1   Title  Patient will perform SLS for 15 seonds on Bil LE to demonstrate improved balance    Time  6    Period  Weeks    Status  On-going        PT LONG TERM GOAL #2   Title  Patient will perform step down test from 6" step, 5x with 1x UE support and no pain to demonstrate improved functional LE strength and tolerance to endurance activity    Baseline  7/11: not assessed this session; reports of discomfort and fatigue with step up training this session        PT [place] #3   Title  Patient will perform 2 MWT at 1.0 m/s or faster with no increase in Rt hip pain and normalized gait pattern.    Baseline  7/11: .8763 m/s    Status  On-going        PT LONG TERM GOAL #4   Title  Patient will improve LEFS score by 9 points or greater to demonstrate significant improvement in self reported function.    Baseline  eval: 20/80; 7/11 26/80  Status  On-going        PT LONG TERM GOAL #5   Title  Patient will improve MMT for bil LE by 1/2 grade or more to demonstrate improved LE strength to improve mobility and gait quality    Status  On-going         Education / Equipment: Patient had been educated on attendance policy, payment options and HEP throughout therapy.  Plan: Patient agrees to discharge.  Patient goals were not met. Patient is being discharged due to not returning since the last visit.  ?????      Kipp Brood, PT, DPT Physical Therapist with [facility]  06/20/2018 1:07 PM    [place], [place], [address] Phone: [phone]   Fax:  [phone]

## 2018-07-25 DIAGNOSIS — Z79899 Other long term (current) drug therapy: Secondary | ICD-10-CM | POA: Diagnosis not present

## 2018-07-25 DIAGNOSIS — M25551 Pain in right hip: Secondary | ICD-10-CM | POA: Diagnosis not present

## 2018-07-25 DIAGNOSIS — Z6832 Body mass index (BMI) 32.0-32.9, adult: Secondary | ICD-10-CM | POA: Diagnosis not present

## 2018-07-25 DIAGNOSIS — M0579 Rheumatoid arthritis with rheumatoid factor of multiple sites without organ or systems involvement: Secondary | ICD-10-CM | POA: Diagnosis not present

## 2018-07-25 DIAGNOSIS — M2550 Pain in unspecified joint: Secondary | ICD-10-CM | POA: Diagnosis not present

## 2018-07-25 DIAGNOSIS — E669 Obesity, unspecified: Secondary | ICD-10-CM | POA: Diagnosis not present

## 2018-07-25 DIAGNOSIS — M5441 Lumbago with sciatica, right side: Secondary | ICD-10-CM | POA: Diagnosis not present

## 2018-07-31 ENCOUNTER — Encounter: Payer: Self-pay | Admitting: Gastroenterology

## 2018-07-31 ENCOUNTER — Other Ambulatory Visit: Payer: Self-pay | Admitting: *Deleted

## 2018-07-31 ENCOUNTER — Ambulatory Visit (INDEPENDENT_AMBULATORY_CARE_PROVIDER_SITE_OTHER): Payer: Medicare HMO | Admitting: Gastroenterology

## 2018-07-31 ENCOUNTER — Encounter: Payer: Self-pay | Admitting: *Deleted

## 2018-07-31 VITALS — BP 145/81 | HR 87 | Temp 97.7°F | Ht 72.0 in | Wt 239.2 lb

## 2018-07-31 DIAGNOSIS — Z800 Family history of malignant neoplasm of digestive organs: Secondary | ICD-10-CM

## 2018-07-31 DIAGNOSIS — R1319 Other dysphagia: Secondary | ICD-10-CM

## 2018-07-31 DIAGNOSIS — R197 Diarrhea, unspecified: Secondary | ICD-10-CM | POA: Diagnosis not present

## 2018-07-31 DIAGNOSIS — R1310 Dysphagia, unspecified: Secondary | ICD-10-CM

## 2018-07-31 NOTE — Progress Notes (Signed)
Primary Care Physician:  Redmond School, MD  Primary Gastroenterologist:  Garfield Cornea, MD   Chief Complaint  Patient presents with  . Dysphagia    occ    HPI:  Robert [name] is a 67 y.o. male here for follow up. He was seen in 12/2017 for dysphagia and due for colonoscopy with FH of CRC.   His procedures were delayed due to recent MI 05/2017 and need for cardiac clearance. He was on Brilinta at that time.   He has since come off Brilinta. Now back on ASA/Plavix. He reports cardiology saw him in 03/2018 and felt he could have colonoscopy. Does not follow up with them until 09/2018.   Patient has FH CRC, mother and brother (brother at age less than 17). Patient's last colonoscopy in 2008. He has chronic intermittent loose stools. Not all the time. Some solid stools. No abd pain, melena, brbpr. He complains of issues swallowing since fall 11/2016 and had compound fx of vertebrae. Difficulty swallowing pills, liquids (strangling). No solid food dysphagia. Has a lot of hoarseness. No heartburn. H/o mild narrowing at GEJ, intermittent GERD, esophageal dysmotility most c/w presbyesophagus on barium study in 04/2016.    Recent uri infection. Cough/congestion. Sees PCP in the morning.   Current Outpatient Medications  Medication Sig Dispense Refill  . acarbose (PRECOSE) 50 MG tablet Take 50 mg by mouth 3 (three) times daily with meals.     [name] acetaminophen (TYLENOL) 650 MG CR tablet Take 650 mg by mouth every 8 (eight) hours as needed for pain.    [name] ALPRAZolam (XANAX) 0.5 MG tablet Take 0.5 mg by mouth 3 (three) times daily as needed for anxiety.     . AMBULATORY NON FORMULARY MEDICATION Take 600 mg by mouth daily at 12 noon. Medication Name: Dalcetrapib vs placebo (Patient taking differently: Take 600 mg by mouth every evening. Medication Name: Dalcetrapib vs placebo: Cholesterol)    . aspirin EC 81 MG EC tablet Take 1 tablet (81 mg total) by mouth daily.    [name] atorvastatin (LIPITOR) 80 MG tablet  TAKE 1 TABLET DAILY AT 6 PM. 90 tablet 3  . clopidogrel (PLAVIX) 75 MG tablet Take 1 tablet (75 mg total) by mouth daily. 90 tablet 3  . fish oil-omega-3 fatty acids 1000 MG capsule Take 1 g by mouth daily.     . folic acid (FOLVITE) 1 MG tablet Take 1 mg by mouth daily.     . furosemide (LASIX) 40 MG tablet Take 2 tablets (80 mg total) by mouth daily. 180 tablet 3  . gabapentin (NEURONTIN) 300 MG capsule Take 300 mg by mouth 3 (three) times daily as needed (for pain).     [name] glipiZIDE (GLUCOTROL) 10 MG tablet Take 10 mg by mouth 2 (two) times daily.    . Insulin Glargine (TOUJEO SOLOSTAR ) Inject 80 Units into the skin daily.    . isosorbide mononitrate (IMDUR) 120 MG 24 hr tablet TAKE 1 TABLET EVERY DAY 90 tablet 3  . losartan (COZAAR) 25 MG tablet Take 0.5 tablets (12.5 mg total) by mouth daily. 30 tablet 6  . metFORMIN (GLUCOPHAGE) 1000 MG tablet Take 1 tablet (1,000 mg total) by mouth 2 (two) times daily with a meal. (Patient taking differently: Take 1,000 mg by mouth 2 (two) times daily with a meal. 1.5 tablets in am and 1 tablet at night)    . methotrexate 50 MG/2ML injection Inject 2 mLs into the skin once a week. Takes every Thursday    .  metoprolol tartrate (LOPRESSOR) 25 MG tablet TAKE 1 TABLET TWICE DAILY 180 tablet 3  . nitroGLYCERIN (NITROSTAT) 0.4 MG SL tablet Place 1 tablet (0.4 mg total) under the tongue every 5 (five) minutes x 3 doses as needed for chest pain. 25 tablet 2  . potassium chloride SA (K-DUR,KLOR-CON) 20 MEQ tablet Take 2 tablets on day one and then 1 tablet by mouth daily thereafter. (Patient taking differently: as needed. Take 2 tablets on day one and then 1 tablet by mouth daily thereafter. Doesn't take everyday) 30 tablet 5  . primidone (MYSOLINE) 50 MG tablet Take 100 mg by mouth daily.     . Tocilizumab 162 MG/0.9ML SOSY Inject 162 mg into the skin every Monday. ACTEMRA    . Insulin Glargine (LANTUS SOLOSTAR) 100 UNIT/ML Solostar Pen Inject 80 Units into the  skin daily at 10 pm.    . loratadine (CLARITIN) 10 MG tablet Take 1 tablet (10 mg total) by mouth daily. (Patient not taking: Reported on 07/31/2018) 30 tablet 0  . magnesium oxide (MAG-OX) 400 MG tablet Take 400 mg by mouth 2 (two) times daily.    [name] oxyCODONE (OXY IR/ROXICODONE) 5 MG immediate release tablet Take 1 tablet (5 mg total) by mouth every 4 (four) hours as needed for moderate pain. (Patient not taking: Reported on 07/31/2018) 12 tablet 0   No current facility-administered medications for this visit.     Allergies as of 07/31/2018 - Review Complete 07/31/2018  Allergen Reaction Noted  . Ivp dye [iodinated diagnostic agents] Itching and Rash 08/27/2011    Past Medical History:  Diagnosis Date  . Anxiety   . COPD (chronic obstructive pulmonary disease) ([place])   . Coronary artery disease   . Full dentures   . GERD (gastroesophageal reflux disease)    Rolaids as needed  . High cholesterol   . History of MI (myocardial infarction)   . Hypertension    med. dosage increased 04/2016; has been on BP med. x 10 yrs.  . Incarcerated umbilical hernia [id number]/9379  . Inguinal hernia 05/2016   bilateral   . Insulin dependent diabetes mellitus ([place])   . Ischemic cardiomyopathy    a. 03/2015 EF 40-45% by LV gram.  . Left leg cellulitis 10/08/2016  . Rheumatoid arthritis(714.0)     Past Surgical History:  Procedure Laterality Date  . CARDIAC CATHETERIZATION N/A 02/01/2015   Procedure: Left Heart Cath and Coronary Angiography;  Surgeon: Belva Crome, MD;  Location: [place] [facility];  Service: Cardiovascular;  Laterality: N/A;  . CARDIAC CATHETERIZATION N/A 03/22/2015   Procedure: Left Heart Cath and Coronary Angiography;  Surgeon: Leonie Man, MD;  Location: [place] [facility];  Service: Cardiovascular;  Laterality: N/A;  . CARDIAC CATHETERIZATION  09/05/2002; 03/09/2003; 04/10/2005;06/02/2005; 05/09/2006; 02/09/2007; 03/01/2007  . COLONOSCOPY  2008   Dr. Gala Romney: diverticulosis, hyperplastic  polyp.  . CORONARY ANGIOPLASTY  11/14/2012; 02/01/2015  . CORONARY ANGIOPLASTY WITH STENT PLACEMENT  05/16/2012   "1; makes total ~ 4"  . CORONARY STENT INTERVENTION N/A 06/06/2017   Procedure: CORONARY STENT INTERVENTION;  Surgeon: Belva Crome, MD;  Location: [place] [facility];  Service: Cardiovascular;  Laterality: N/A;  . INSERTION OF MESH Bilateral 05/24/2016   Procedure: INSERTION OF MESH;  Surgeon: Coralie Keens, MD;  Location: [place];  Service: General;  Laterality: Bilateral;  . LAPAROSCOPIC CHOLECYSTECTOMY    . LAPAROSCOPIC INGUINAL HERNIA WITH UMBILICAL HERNIA Bilateral 05/24/2016   Procedure: BILATERAL LAPAROSCOPIC INGUINAL HERNIA REPAIR WITH MESH  AND UMBILICAL HERNIA REPAIR WITH MESH;  Surgeon: Coralie Keens, MD;  Location: [place];  Service: General;  Laterality: Bilateral;  . LEFT HEART CATH AND CORONARY ANGIOGRAPHY N/A 06/06/2017   Procedure: LEFT HEART CATH AND CORONARY ANGIOGRAPHY;  Surgeon: Belva Crome, MD;  Location: [place] [facility];  Service: Cardiovascular;  Laterality: N/A;  . LEFT HEART CATHETERIZATION WITH CORONARY ANGIOGRAM N/A 12/22/2011   Procedure: LEFT HEART CATHETERIZATION WITH CORONARY ANGIOGRAM;  Surgeon: Troy Sine, MD;  Location: [facility] CATH LAB;  Service: Cardiovascular;  Laterality: N/A;  . LEFT HEART CATHETERIZATION WITH CORONARY ANGIOGRAM Bilateral 05/16/2012   Procedure: LEFT HEART CATHETERIZATION WITH CORONARY ANGIOGRAM;  Surgeon: Lorretta Harp, MD;  Location[facility] C[facility] LAB;  Service: Cardiovascular;  Laterality: Bilateral;  . LEFT HEART CATHETERIZATION WITH CORONARY ANGIOGRAM N/A 11/14/2012   Procedure: LEFT HEART CATHETERIZATION WITH CORONARY ANGIOGRAM;  Surgeon: Troy Sine, MD;  Location: [facility] CATH LAB;  Service: Cardiovascular;  Laterality: N/A;  . LEFT HEART CATHETERIZATION WITH CORONARY ANGIOGRAM N/A 09/01/2013   Procedure: LEFT HEART CATHETERIZATION WITH CORONARY ANGIOGRAM;  Surgeon: Leonie Man, MD;   Location: [facility] CATH LAB;  Service: Cardiovascular;  Laterality: N/A;  . [place]; 1973; 1985   x 3  . NM MYOCAR PERF WALL MOTION  08/30/2009   No significant ischemia  . OPEN REDUCTION INTERNAL FIXATION (ORIF) DISTAL RADIAL FRACTURE Right 12/10/2016   Procedure: OPEN REDUCTION INTERNAL FIXATION (ORIF) DISTAL RADIAL FRACTURE;  Surgeon: Iran Planas, MD;  Location: [place];  Service: Orthopedics;  Laterality: Right;  . PERCUTANEOUS CORONARY INTERVENTION-BALLOON ONLY  12/22/2011   Procedure: PERCUTANEOUS CORONARY INTERVENTION-BALLOON ONLY;  Surgeon: Troy Sine, MD;  Location: [facility] CATH LAB;  Service: Cardiovascular;;  . PERCUTANEOUS CORONARY INTERVENTION-BALLOON ONLY  11/14/2012   Procedure: PERCUTANEOUS CORONARY INTERVENTION-BALLOON ONLY;  Surgeon: Troy Sine, MD;  Location: [facility] C[facility] LAB;  Service: Cardiovascular;;  . PERCUTANEOUS CORONARY STENT INTERVENTION (PCI-S)  05/16/2012   Procedure: PERCUTANEOUS CORONARY STENT INTERVENTION (PCI-S);  Surgeon: Lorretta Harp, MD;  Location: [facility] CATH LAB;  Service: Cardiovascular;;  . PERCUTANEOUS CORONARY STENT INTERVENTION (PCI-S)  09/01/2013   Procedure: PERCUTANEOUS CORONARY STENT INTERVENTION (PCI-S);  Surgeon: Leonie Man, MD;  Location: [facility] CATH LAB;  Service: Cardiovascular;;    Family History  Problem Relation Age of Onset  . Heart disease Father   . Colon cancer Brother        early 31s  . Hypertension Mother   . Heart disease Mother   . Colon cancer Mother        died age 1. late 66s early 44s at diagnosis.   . Lung cancer Mother     Social History   Socioeconomic History  . Marital status: Married    Spouse name: Not on file  . Number of children: Not on file  . Years of education: Not on file  . Highest education level: Not on file  Occupational History  . Occupation: Clinical biochemist  Social Needs  . Financial resource strain: Not on file  . Food insecurity:    Worry: Not on file    Inability: Not on  file  . Transportation needs:    Medical: Not on file    Non-medical: Not on file  Tobacco Use  . Smoking status: Current Some Day Smoker    Packs/day: 0.50    Types: Cigarettes    Last attempt to quit: 06/11/2015    Years since quitting: 3.1  .  Smokeless tobacco: Never Used  . Tobacco comment: pt states smoking less than half a pack  Substance and Sexual Activity  . Alcohol use: No  . Drug use: No  . Sexual activity: Yes  Lifestyle  . Physical activity:    Days per week: Not on file    Minutes per session: Not on file  . Stress: Not on file  Relationships  . Social connections:    Talks on phone: Not on file    Gets together: Not on file    Attends religious service: Not on file    Active member of club or organization: Not on file    Attends meetings of clubs or organizations: Not on file    Relationship status: Not on file  . Intimate partner violence:    Fear of current or ex partner: Not on file    Emotionally abused: Not on file    Physically abused: Not on file    Forced sexual activity: Not on file  Other Topics Concern  . Not on file  Social History Narrative  . Not on file      ROS:  General: Negative for anorexia, weight loss, fever, chills, fatigue, weakness. Eyes: Negative for vision changes.  ENT: Negative for hoarseness, difficulty swallowing , nasal congestion. CV: Negative for chest pain, angina, palpitations, dyspnea on exertion, peripheral edema.  Respiratory: Negative for dyspnea at rest, dyspnea on exertion, ++cough, sputum, wheezing.  GI: See history of present illness. GU:  Negative for dysuria, hematuria, urinary incontinence, urinary frequency, nocturnal urination.  MS: ++ for joint pain, low back pain.  Derm: Negative for rash or itching.  Neuro: Negative for weakness, abnormal sensation, seizure, frequent headaches, memory loss, confusion.  Psych: Negative for anxiety, depression, suicidal ideation, hallucinations.  Endo: Negative for  unusual weight change.  Heme: Negative for bruising or bleeding. Allergy: Negative for rash or hives.    Physical Examination:  BP (!) 145/81   Pulse 87   Temp 97.7 F (36.5 C) (Oral)   Ht 6' (1.829 m)   Wt 239 lb 3.2 oz (108.5 kg)   BMI 32.44 kg/m    General: Well-nourished, well-developed in no acute distress.  Head: Normocephalic, atraumatic.   Eyes: Conjunctiva pink, no icterus. Mouth: Oropharyngeal mucosa moist and pink , no lesions erythema or exudate. Neck: Supple without thyromegaly, masses, or lymphadenopathy.  Lungs: Clear to auscultation bilaterally.  Heart: Regular rate and rhythm, no murmurs rubs or gallops.  Abdomen: Bowel sounds are normal, nontender, nondistended, no hepatosplenomegaly or masses, no abdominal bruits or    hernia , no rebound or guarding.   Rectal: not performed Extremities: No lower extremity edema. No clubbing or deformities.  Neuro: Alert and oriented x 4 , grossly normal neurologically.  Skin: Warm and dry, no rash or jaundice.   Psych: Alert and cooperative, normal mood and affect.  Imaging Studies: No results found.

## 2018-07-31 NOTE — Patient Instructions (Signed)
1. Colonoscopy and upper endoscopy as scheduled. Please see separate instructions. 

## 2018-07-31 NOTE — Assessment & Plan Note (Signed)
Intermittent diarrhea, less frequent than previously. FH CRC. Plan on colonoscopy with deep sedation and assistance with anesthesiology given cardiopulmonary issues. He is no longer on Brilinta. Takes ASA/Plavix which he will continue for procedures.  I have discussed the risks, alternatives, benefits with regards to but not limited to the risk of reaction to medication, bleeding, infection, perforation and the patient is agreeable to proceed. Written consent to be obtained.

## 2018-07-31 NOTE — Assessment & Plan Note (Signed)
Pill and liquid dysphagia. No solid food dysphagia. Hoarseness. Prior barium swallowing 2017 with mild narrowing at GEJ. Offered EGD/ED with deep sedation and assistance with anesthesiology given cardiopulmonary issues.  I have discussed the risks, alternatives, benefits with regards to but not limited to the risk of reaction to medication, bleeding, infection, perforation and the patient is agreeable to proceed. Written consent to be obtained.

## 2018-08-01 ENCOUNTER — Telehealth: Payer: Self-pay | Admitting: *Deleted

## 2018-08-01 DIAGNOSIS — J449 Chronic obstructive pulmonary disease, unspecified: Secondary | ICD-10-CM | POA: Diagnosis not present

## 2018-08-01 DIAGNOSIS — Z6832 Body mass index (BMI) 32.0-32.9, adult: Secondary | ICD-10-CM | POA: Diagnosis not present

## 2018-08-01 DIAGNOSIS — E6609 Other obesity due to excess calories: Secondary | ICD-10-CM | POA: Diagnosis not present

## 2018-08-01 DIAGNOSIS — J069 Acute upper respiratory infection, unspecified: Secondary | ICD-10-CM | POA: Diagnosis not present

## 2018-08-01 DIAGNOSIS — E1165 Type 2 diabetes mellitus with hyperglycemia: Secondary | ICD-10-CM | POA: Diagnosis not present

## 2018-08-01 NOTE — Progress Notes (Signed)
CC'D TO PCP °

## 2018-08-01 NOTE — Telephone Encounter (Signed)
Pre-op scheduled for 09/12/18 at 11:00am. Patient aware. Letter mailed.

## 2018-08-05 DIAGNOSIS — J449 Chronic obstructive pulmonary disease, unspecified: Secondary | ICD-10-CM | POA: Diagnosis not present

## 2018-08-05 DIAGNOSIS — R600 Localized edema: Secondary | ICD-10-CM | POA: Diagnosis not present

## 2018-08-05 DIAGNOSIS — I10 Essential (primary) hypertension: Secondary | ICD-10-CM | POA: Diagnosis not present

## 2018-08-05 DIAGNOSIS — I872 Venous insufficiency (chronic) (peripheral): Secondary | ICD-10-CM | POA: Diagnosis not present

## 2018-08-05 DIAGNOSIS — R201 Hypoesthesia of skin: Secondary | ICD-10-CM | POA: Diagnosis not present

## 2018-08-05 DIAGNOSIS — J329 Chronic sinusitis, unspecified: Secondary | ICD-10-CM | POA: Diagnosis not present

## 2018-08-05 DIAGNOSIS — Z6832 Body mass index (BMI) 32.0-32.9, adult: Secondary | ICD-10-CM | POA: Diagnosis not present

## 2018-08-05 DIAGNOSIS — E1140 Type 2 diabetes mellitus with diabetic neuropathy, unspecified: Secondary | ICD-10-CM | POA: Diagnosis not present

## 2018-08-05 DIAGNOSIS — F17290 Nicotine dependence, other tobacco product, uncomplicated: Secondary | ICD-10-CM | POA: Diagnosis not present

## 2018-08-26 ENCOUNTER — Emergency Department (HOSPITAL_COMMUNITY): Payer: Medicare HMO

## 2018-08-26 ENCOUNTER — Encounter (HOSPITAL_COMMUNITY): Payer: Self-pay | Admitting: *Deleted

## 2018-08-26 ENCOUNTER — Inpatient Hospital Stay (HOSPITAL_COMMUNITY)
Admission: EM | Admit: 2018-08-26 | Discharge: 2018-08-28 | DRG: 291 | Disposition: A | Discharge status: Home Health | Payer: Medicare HMO | Attending: Internal Medicine | Admitting: Internal Medicine

## 2018-08-26 ENCOUNTER — Other Ambulatory Visit: Payer: Self-pay

## 2018-08-26 DIAGNOSIS — Z800 Family history of malignant neoplasm of digestive organs: Secondary | ICD-10-CM | POA: Diagnosis not present

## 2018-08-26 DIAGNOSIS — Z9049 Acquired absence of other specified parts of digestive tract: Secondary | ICD-10-CM | POA: Diagnosis not present

## 2018-08-26 DIAGNOSIS — I2510 Atherosclerotic heart disease of native coronary artery without angina pectoris: Secondary | ICD-10-CM | POA: Diagnosis present

## 2018-08-26 DIAGNOSIS — R05 Cough: Secondary | ICD-10-CM | POA: Diagnosis not present

## 2018-08-26 DIAGNOSIS — K219 Gastro-esophageal reflux disease without esophagitis: Secondary | ICD-10-CM | POA: Diagnosis present

## 2018-08-26 DIAGNOSIS — I252 Old myocardial infarction: Secondary | ICD-10-CM | POA: Diagnosis not present

## 2018-08-26 DIAGNOSIS — I5043 Acute on chronic combined systolic (congestive) and diastolic (congestive) heart failure: Secondary | ICD-10-CM | POA: Diagnosis present

## 2018-08-26 DIAGNOSIS — R079 Chest pain, unspecified: Secondary | ICD-10-CM | POA: Diagnosis not present

## 2018-08-26 DIAGNOSIS — R0602 Shortness of breath: Secondary | ICD-10-CM | POA: Diagnosis not present

## 2018-08-26 DIAGNOSIS — Z9114 Patient's other noncompliance with medication regimen: Secondary | ICD-10-CM | POA: Diagnosis not present

## 2018-08-26 DIAGNOSIS — J9601 Acute respiratory failure with hypoxia: Secondary | ICD-10-CM | POA: Diagnosis present

## 2018-08-26 DIAGNOSIS — Z8249 Family history of ischemic heart disease and other diseases of the circulatory system: Secondary | ICD-10-CM | POA: Diagnosis not present

## 2018-08-26 DIAGNOSIS — F17210 Nicotine dependence, cigarettes, uncomplicated: Secondary | ICD-10-CM | POA: Diagnosis present

## 2018-08-26 DIAGNOSIS — E785 Hyperlipidemia, unspecified: Secondary | ICD-10-CM | POA: Diagnosis present

## 2018-08-26 DIAGNOSIS — R000 Tachycardia, unspecified: Secondary | ICD-10-CM | POA: Diagnosis not present

## 2018-08-26 DIAGNOSIS — E1151 Type 2 diabetes mellitus with diabetic peripheral angiopathy without gangrene: Secondary | ICD-10-CM | POA: Diagnosis present

## 2018-08-26 DIAGNOSIS — J811 Chronic pulmonary edema: Secondary | ICD-10-CM | POA: Diagnosis present

## 2018-08-26 DIAGNOSIS — I5042 Chronic combined systolic (congestive) and diastolic (congestive) heart failure: Secondary | ICD-10-CM | POA: Diagnosis present

## 2018-08-26 DIAGNOSIS — Z91041 Radiographic dye allergy status: Secondary | ICD-10-CM

## 2018-08-26 DIAGNOSIS — Z7902 Long term (current) use of antithrombotics/antiplatelets: Secondary | ICD-10-CM | POA: Diagnosis not present

## 2018-08-26 DIAGNOSIS — I10 Essential (primary) hypertension: Secondary | ICD-10-CM | POA: Diagnosis present

## 2018-08-26 DIAGNOSIS — I255 Ischemic cardiomyopathy: Secondary | ICD-10-CM | POA: Diagnosis present

## 2018-08-26 DIAGNOSIS — Z7982 Long term (current) use of aspirin: Secondary | ICD-10-CM | POA: Diagnosis not present

## 2018-08-26 DIAGNOSIS — Z794 Long term (current) use of insulin: Secondary | ICD-10-CM

## 2018-08-26 DIAGNOSIS — R0600 Dyspnea, unspecified: Secondary | ICD-10-CM | POA: Diagnosis not present

## 2018-08-26 DIAGNOSIS — J449 Chronic obstructive pulmonary disease, unspecified: Secondary | ICD-10-CM | POA: Diagnosis present

## 2018-08-26 DIAGNOSIS — Z801 Family history of malignant neoplasm of trachea, bronchus and lung: Secondary | ICD-10-CM

## 2018-08-26 DIAGNOSIS — R0689 Other abnormalities of breathing: Secondary | ICD-10-CM | POA: Diagnosis not present

## 2018-08-26 DIAGNOSIS — I509 Heart failure, unspecified: Secondary | ICD-10-CM

## 2018-08-26 DIAGNOSIS — Z955 Presence of coronary angioplasty implant and graft: Secondary | ICD-10-CM

## 2018-08-26 DIAGNOSIS — I110 Hypertensive heart disease with heart failure: Principal | ICD-10-CM | POA: Diagnosis present

## 2018-08-26 DIAGNOSIS — I160 Hypertensive urgency: Secondary | ICD-10-CM | POA: Diagnosis present

## 2018-08-26 DIAGNOSIS — R0902 Hypoxemia: Secondary | ICD-10-CM | POA: Diagnosis not present

## 2018-08-26 DIAGNOSIS — E7800 Pure hypercholesterolemia, unspecified: Secondary | ICD-10-CM | POA: Diagnosis present

## 2018-08-26 DIAGNOSIS — Z79899 Other long term (current) drug therapy: Secondary | ICD-10-CM

## 2018-08-26 DIAGNOSIS — F419 Anxiety disorder, unspecified: Secondary | ICD-10-CM | POA: Diagnosis present

## 2018-08-26 DIAGNOSIS — M069 Rheumatoid arthritis, unspecified: Secondary | ICD-10-CM | POA: Diagnosis present

## 2018-08-26 NOTE — ED Provider Notes (Signed)
[facility] EMERGENCY DEPARTMENT Provider Note   CSN: [id number] Arrival date & time: 08/26/18  2346     History   Chief Complaint Chief Complaint  Patient presents with  . Shortness of Breath    HPI Robert [name] is a 67 y.o. male.  Patient with previous history of COPD and CHF presents to the emergency department for evaluation of chest pain and shortness of breath.  Patient presents by EMS from home.  Patient reports onset of chest pain earlier tonight with worsening difficulty breathing.  Patient administered aspirin and nitroglycerin by EMS.  Chest pain has improved.  Patient hypertensive during transport, improving with nitroglycerin.  EMS report finding the patient in the tripod position, gasping for air upon their arrival.  Room air oxygen saturation was 80%.  There was no improvement with nasal cannula oxygen, patient placed on CPAP with improvement.     Past Medical History:  Diagnosis Date  . Anxiety   . COPD (chronic obstructive pulmonary disease) ([place])   . Coronary artery disease   . Full dentures   . GERD (gastroesophageal reflux disease)    Rolaids as needed  . High cholesterol   . History of MI (myocardial infarction)   . Hypertension    med. dosage increased 04/2016; has been on BP med. x 10 yrs.  . Incarcerated umbilical hernia 94/1740  . Inguinal hernia 05/2016   bilateral   . Insulin dependent diabetes mellitus ([place])   . Ischemic cardiomyopathy    a. 03/2015 EF 40-45% by LV gram.  . Left leg cellulitis 10/08/2016  . Rheumatoid arthritis(714.0)     Patient Active Problem List   Diagnosis Date Noted  . Dyslipidemia (high LDL; low HDL) 03/29/2018  . Dysphagia 01/02/2018  . Diarrhea 01/02/2018  . Family history of colon cancer 01/02/2018  . Ischemic cardiomyopathy   . Insulin dependent diabetes mellitus ([place])   . Hypertension   . History of MI (myocardial infarction)   . GERD (gastroesophageal reflux disease)   . Full dentures   . Coronary  artery disease   . Anxiety   . Acute ST elevation myocardial infarction (STEMI) of inferior wall ([place]) 06/06/2017  . Varicose veins of left lower extremity with complications 81/44/8185  . Cellulitis of left leg without foot   . Right radial fracture 12/11/2016  . T3 vertebral fracture ([place]) 12/11/2016  . Fall 12/09/2016  . Thrombocytopenia ([place]) 10/09/2016  . Left leg cellulitis 10/08/2016  . Diabetes ([place]) 10/08/2016  . Inguinal hernia 05/12/2016  . Incarcerated umbilical hernia 63/14/9702  . Chronic combined systolic and diastolic CHF (congestive heart failure) ([place]) 04/25/2016  . High cholesterol   . Controlled type 2 diabetes mellitus with diabetic peripheral angiopathy without gangrene, with long-term current use of insulin ([place])   . Essential hypertension   . Non-STEMI (non-ST elevated myocardial infarction) ([place])   . CAD S/P percutaneous coronary angioplasty   . Unstable angina ([place]) 03/20/2015  . ICM-EF 35% at cath 02/01/15 02/02/2015  . CAD (coronary artery disease) 01/09/2015  . DM type 2 causing vascular disease, not at goal [facility]) 05/29/2014  . Dyspnea 10/20/2013  . COPD (chronic obstructive pulmonary disease) ([place]) 09/03/2013  . Hyperlipidemia   . Old myocardial infarction 09/01/2013  . Abnormal nuclear stress test 05/17/2012  . S/P CFX PTCAI for ISR 02/01/15 05/17/2012  . PVD, chronic LLE 12/22/2011  . Rheumatoid arthritis ([place]) 12/22/2011  . Contrast media allergy 12/22/2011  . Tobacco abuse 12/21/2011    Past Surgical History:  Procedure Laterality Date  . CARDIAC CATHETERIZATION N/A 02/01/2015   Procedure: Left Heart Cath and Coronary Angiography;  Surgeon: Belva Crome, MD;  Location: [place] CV LAB;  Service: Cardiovascular;  Laterality: N/A;  . CARDIAC CATHETERIZATION N/A 03/22/2015   Procedure: Left Heart Cath and Coronary Angiography;  Surgeon: Leonie Man, MD;  Location: [place] CV LAB;  Service: Cardiovascular;  Laterality: N/A;  . CARDIAC  CATHETERIZATION  09/05/2002; 03/09/2003; 04/10/2005;06/02/2005; 05/09/2006; 02/09/2007; 03/01/2007  . COLONOSCOPY  2008   Dr. Gala Romney: diverticulosis, hyperplastic polyp.  . CORONARY ANGIOPLASTY  11/14/2012; 02/01/2015  . CORONARY ANGIOPLASTY WITH STENT PLACEMENT  05/16/2012   "1; makes total ~ 4"  . CORONARY STENT INTERVENTION N/A 06/06/2017   Procedure: CORONARY STENT INTERVENTION;  Surgeon: Belva Crome, MD;  Location: [place] [facility] LAB;  Service: Cardiovascular;  Laterality: N/A;  . INSERTION OF MESH Bilateral 05/24/2016   Procedure: INSERTION OF MESH;  Surgeon: Coralie Keens, MD;  Location: [place];  Service: General;  Laterality: Bilateral;  . LAPAROSCOPIC CHOLECYSTECTOMY    . LAPAROSCOPIC INGUINAL HERNIA WITH UMBILICAL HERNIA Bilateral 05/24/2016   Procedure: BILATERAL LAPAROSCOPIC INGUINAL HERNIA REPAIR WITH MESH AND UMBILICAL HERNIA REPAIR WITH MESH;  Surgeon: Coralie Keens, MD;  Location: [place];  Service: General;  Laterality: Bilateral;  . LEFT HEART CATH AND CORONARY ANGIOGRAPHY N/A 06/06/2017   Procedure: LEFT HEART CATH AND CORONARY ANGIOGRAPHY;  Surgeon: Belva Crome, MD;  Location: [place] CV LAB;  Service: Cardiovascular;  Laterality: N/A;  . LEFT HEART CATHETERIZATION WITH CORONARY ANGIOGRAM N/A 12/22/2011   Procedure: LEFT HEART CATHETERIZATION WITH CORONARY ANGIOGRAM;  Surgeon: Troy Sine, MD;  Location: [facility] CATH LAB;  Service: Cardiovascular;  Laterality: N/A;  . LEFT HEART CATHETERIZATION WITH CORONARY ANGIOGRAM Bilateral 05/16/2012   Procedure: LEFT HEART CATHETERIZATION WITH CORONARY ANGIOGRAM;  Surgeon: Lorretta Harp, MD;  Location: [facility] CATH LAB;  Service: Cardiovascular;  Laterality: Bilateral;  . LEFT HEART CATHETERIZATION WITH CORONARY ANGIOGRAM N/A 11/14/2012   Procedure: LEFT HEART CATHETERIZATION WITH CORONARY ANGIOGRAM;  Surgeon: Troy Sine, MD;  Location: [facility] CATH LAB;  Service: Cardiovascular;  Laterality: N/A;  . LEFT  HEART CATHETERIZATION WITH CORONARY ANGIOGRAM N/A 09/01/2013   Procedure: LEFT HEART CATHETERIZATION WITH CORONARY ANGIOGRAM;  Surgeon: Leonie Man, MD;  Location: [facility] CATH LAB;  Service: Cardiovascular;  Laterality: N/A;  . [place]; 1973; 1985   x 3  . NM MYOCAR PERF WALL MOTION  08/30/2009   No significant ischemia  . OPEN REDUCTION INTERNAL FIXATION (ORIF) DISTAL RADIAL FRACTURE Right 12/10/2016   Procedure: OPEN REDUCTION INTERNAL FIXATION (ORIF) DISTAL RADIAL FRACTURE;  Surgeon: Iran Planas, MD;  Location: [place];  Service: Orthopedics;  Laterality: Right;  . PERCUTANEOUS CORONARY INTERVENTION-BALLOON ONLY  12/22/2011   Procedure: PERCUTANEOUS CORONARY INTERVENTION-BALLOON ONLY;  Surgeon: Troy Sine, MD;  Location: [facility] CATH LAB;  Service: Cardiovascular;;  . PERCUTANEOUS CORONARY INTERVENTION-BALLOON ONLY  11/14/2012   Procedure: PERCUTANEOUS CORONARY INTERVENTION-BALLOON ONLY;  Surgeon: Troy Sine, MD;  Location: [facility] CATH LAB;  Service: Cardiovascular;;  . PERCUTANEOUS CORONARY STENT INTERVENTION (PCI-S)  05/16/2012   Procedure: PERCUTANEOUS CORONARY STENT INTERVENTION (PCI-S);  Surgeon: Lorretta Harp, MD;  Location: [facility] CATH LAB;  Service: Cardiovascular;;  . PERCUTANEOUS CORONARY STENT INTERVENTION (PCI-S)  09/01/2013   Procedure: PERCUTANEOUS CORONARY STENT INTERVENTION (PCI-S);  Surgeon: Leonie Man, MD;  Location: [facility] CATH LAB;  Service: Cardiovascular;;        Home  Medications    Prior to Admission medications   Medication Sig Start Date End Date Taking? Authorizing Provider  acarbose (PRECOSE) 50 MG tablet Take 50 mg by mouth 3 (three) times daily with meals.  03/08/17   [provider]  acetaminophen (TYLENOL) 650 MG CR tablet Take 650 mg by mouth every 8 (eight) hours as needed for pain.    [provider]  ALPRAZolam Duanne Moron) 0.5 MG tablet Take 0.5 mg by mouth 3 (three) times daily as needed for anxiety.     [provider]    AMBULATORY NON FORMULARY MEDICATION Take 600 mg by mouth daily at 12 noon. Medication Name: Dalcetrapib vs placebo Patient taking differently: Take 600 mg by mouth every evening. Medication Name: Dalcetrapib vs placebo: Cholesterol 12/15/16   Jettie Booze, MD  aspirin EC 81 MG EC tablet Take 1 tablet (81 mg total) by mouth daily. 11/15/12   Brett Canales, PA-C  atorvastatin (LIPITOR) 80 MG tablet TAKE 1 TABLET DAILY AT 6 PM. 12/21/17   Croitoru, Dani Gobble, MD  clopidogrel (PLAVIX) 75 MG tablet Take 1 tablet (75 mg total) by mouth daily. 03/28/18   Croitoru, Mihai, MD  fish oil-omega-3 fatty acids 1000 MG capsule Take 1 g by mouth daily.     [provider]  folic acid (FOLVITE) 1 MG tablet Take 1 mg by mouth daily.     [provider]  furosemide (LASIX) 40 MG tablet Take 2 tablets (80 mg total) by mouth daily. 09/28/17   Croitoru, Mihai, MD  gabapentin (NEURONTIN) 300 MG capsule Take 300 mg by mouth 3 (three) times daily as needed (for pain).  03/10/17   [provider]  glipiZIDE (GLUCOTROL) 10 MG tablet Take 10 mg by mouth 2 (two) times daily. 02/12/17   [provider]  Insulin Glargine (TOUJEO SOLOSTAR [place]) Inject 80 Units into the skin daily.    [provider]  isosorbide mononitrate (IMDUR) 120 MG 24 hr tablet TAKE 1 TABLET EVERY DAY 01/08/18   Croitoru, Mihai, MD  losartan (COZAAR) 25 MG tablet Take 0.5 tablets (12.5 mg total) by mouth daily. 06/12/17   Leanor Kail, PA  metFORMIN (GLUCOPHAGE) 1000 MG tablet Take 1 tablet (1,000 mg total) by mouth 2 (two) times daily with a meal. Patient taking differently: Take 1,000 mg by mouth 2 (two) times daily with a meal. 1.5 tablets in am and 1 tablet at night 03/23/15   Bhagat, [place], PA  methotrexate 50 MG/2ML injection Inject 2 mLs into the skin once a week. Takes every Thursday 12/01/17   [provider]  metoprolol tartrate (LOPRESSOR) 25 MG tablet TAKE 1 TABLET TWICE DAILY 01/07/18    Croitoru, Dani Gobble, MD  nitroGLYCERIN (NITROSTAT) 0.4 MG SL tablet Place 1 tablet (0.4 mg total) under the tongue every 5 (five) minutes x 3 doses as needed for chest pain. 04/24/16   Croitoru, Mihai, MD  potassium chloride SA (K-DUR,KLOR-CON) 20 MEQ tablet Take 2 tablets on day one and then 1 tablet by mouth daily thereafter. Patient taking differently: as needed. Take 2 tablets on day one and then 1 tablet by mouth daily thereafter. Doesn't take everyday 10/05/17   Croitoru, Dani Gobble, MD  primidone (MYSOLINE) 50 MG tablet Take 100 mg by mouth daily.     [provider]  Tocilizumab 162 MG/0.9ML SOSY Inject 162 mg into the skin every Monday. ACTEMRA    [provider]    Family History Family History  Problem Relation Age of Onset  .  Heart disease Father   . Colon cancer Brother        early 38s  . Hypertension Mother   . Heart disease Mother   . Colon cancer Mother        died age 75. late 57s early 70s at diagnosis.   . Lung cancer Mother     Social History Social History   Tobacco Use  . Smoking status: Current Some Day Smoker    Packs/day: 1.00    Types: Cigarettes    Last attempt to quit: 06/11/2015    Years since quitting: 3.2  . Smokeless tobacco: Never Used  . Tobacco comment: pt states smoking less than half a pack  Substance Use Topics  . Alcohol use: No  . Drug use: No     Allergies   Ivp dye [iodinated diagnostic agents]   Review of Systems Review of Systems  Respiratory: Positive for shortness of breath.   Cardiovascular: Positive for chest pain and leg swelling.  All other systems reviewed and are negative.    Physical Exam Updated Vital Signs Pulse 98   Resp (!) 30   Ht 6' (1.829 m)   Wt 106.6 kg   SpO2 100%   BMI 31.87 kg/m   Physical Exam Vitals signs and nursing note reviewed.  Constitutional:      General: He is not in acute distress.    Appearance: Normal appearance. He is well-developed.  HENT:     Head: Normocephalic  and atraumatic.     Right Ear: Hearing normal.     Left Ear: Hearing normal.     Nose: Nose normal.  Eyes:     Conjunctiva/sclera: Conjunctivae normal.     Pupils: Pupils are equal, round, and reactive to light.  Neck:     Musculoskeletal: Normal range of motion and neck supple.  Cardiovascular:     Rate and Rhythm: Regular rhythm.     Heart sounds: S1 normal and S2 normal. No murmur. No friction rub. No gallop.   Pulmonary:     Effort: Pulmonary effort is normal. No respiratory distress.     Breath sounds: Decreased breath sounds present.  Chest:     Chest wall: No tenderness.  Abdominal:     General: Bowel sounds are normal.     Palpations: Abdomen is soft.     Tenderness: There is no abdominal tenderness. There is no guarding or rebound. Negative signs include Murphy's sign and McBurney's sign.     Hernia: No hernia is present.  Musculoskeletal: Normal range of motion.     Right lower leg: Edema present.     Left lower leg: Edema present.  Skin:    General: Skin is warm and dry.     Findings: No rash.  Neurological:     Mental Status: He is alert and oriented to person, place, and time.     GCS: GCS eye subscore is 4. GCS verbal subscore is 5. GCS motor subscore is 6.     Cranial Nerves: No cranial nerve deficit.     Sensory: No sensory deficit.     Coordination: Coordination normal.  Psychiatric:        Speech: Speech normal.        Behavior: Behavior normal.        Thought Content: Thought content normal.      ED Treatments / Results  Labs (all labs ordered are listed, but only abnormal results are displayed) Labs Reviewed  CBC WITH DIFFERENTIAL/PLATELET -  Abnormal; Notable for the following components:      Result Value   RBC 5.83 (*)    Hemoglobin 18.2 (*)    HCT 55.0 (*)    Platelets 130 (*)    All other components within normal limits  COMPREHENSIVE METABOLIC PANEL - Abnormal; Notable for the following components:   Glucose, Bld 201 (*)    All other  components within normal limits  BRAIN NATRIURETIC PEPTIDE - Abnormal; Notable for the following components:   B Natriuretic Peptide 247.0 (*)    All other components within normal limits  TROPONIN I  BLOOD GAS, ARTERIAL    EKG EKG Interpretation  Date/Time:  Monday August 26 2018 23:58:23 EST Ventricular Rate:  97 PR Interval:    QRS Duration: 106 QT Interval:  368 QTC Calculation: 468 R Axis:   2 Text Interpretation:  Sinus rhythm Ventricular premature complex Inferior infarct, age indeterminate No significant change since last tracing Confirmed by Orpah Greek [phone]) on 08/27/2018 12:50:37 AM   Radiology Dg Chest Port 1 View  Result Date: 08/27/2018 CLINICAL DATA:  Sudden onset of shortness of breath. Cough. EXAM: PORTABLE CHEST 1 VIEW COMPARISON:  Radiographs and CT 12/09/2016 FINDINGS: Cardiomegaly is similar. Unchanged mediastinal contours with aortic atherosclerosis. Increased peribronchial thickening and interstitial opacities with Kerley B-lines consistent pulmonary edema. Small left and possibly trace right pleural effusion. Streaky bibasilar atelectasis. No pneumothorax. IMPRESSION: Findings consistent with CHF with moderate pulmonary edema. Electronically Signed   By: Keith Rake M.D.   On: 08/27/2018 00:43    Procedures Procedures (including critical care time)  Medications Ordered in ED Medications  furosemide (LASIX) injection 80 mg (has no administration in time range)     Initial Impression / Assessment and Plan / ED Course  I have reviewed the triage vital signs and the nursing notes.  Pertinent labs & imaging results that were available during my care of the patient were reviewed by me and considered in my medical decision making (see chart for details).     Patient presents to the emergency department for evaluation of difficulty breathing.  Patient does have a history of COPD as well as CHF.  Patient was very hypertensive and hypoxic  upon arrival of EMS.  They administered Lasix and CPAP with improvement of blood pressure as well as saturations.  Patient did have chest pain initially, this has resolved.  He does have a cardiac history.  EKG does not show any suspicious changes, first troponin negative.  Chest x-ray does show evidence of pulmonary edema.  Patient administered Lasix, will be admitted for further management of acute pulmonary edema secondary to decompensated congestive heart failure.  CRITICAL CARE Performed by: Orpah Greek   Total critical care time: 35 minutes  Critical care time was exclusive of separately billable procedures and treating other patients.  Critical care was necessary to treat or prevent imminent or life-threatening deterioration.  Critical care was time spent personally by me on the following activities: development of treatment plan with patient and/or surrogate as well as nursing, discussions with consultants, evaluation of patient's response to treatment, examination of patient, obtaining history from patient or surrogate, ordering and performing treatments and interventions, ordering and review of laboratory studies, ordering and review of radiographic studies, pulse oximetry and re-evaluation of patient's condition.   Final Clinical Impressions(s) / ED Diagnoses   Final diagnoses:  Acute on chronic congestive heart failure, unspecified heart failure type [facility])    ED Discharge Orders  None       Orpah Greek, MD 08/27/18 [phone]

## 2018-08-26 NOTE — ED Triage Notes (Signed)
Pt brought in by [facility] for c/o sob that started tonight; ems arrived on scene with pt's O2 sats in the lower 80's; pt placed on cpap with ems and O2 sats increased to 89%; pt has bilateral diminished lung sounds; pt administered nitroglycerin SL at home with no relief, ems administered one SL nitroglycerin with some relief in the sob

## 2018-08-27 ENCOUNTER — Other Ambulatory Visit: Payer: Self-pay

## 2018-08-27 ENCOUNTER — Encounter (HOSPITAL_COMMUNITY): Payer: Self-pay

## 2018-08-27 DIAGNOSIS — Z91041 Radiographic dye allergy status: Secondary | ICD-10-CM | POA: Diagnosis not present

## 2018-08-27 DIAGNOSIS — F17210 Nicotine dependence, cigarettes, uncomplicated: Secondary | ICD-10-CM | POA: Diagnosis present

## 2018-08-27 DIAGNOSIS — E785 Hyperlipidemia, unspecified: Secondary | ICD-10-CM | POA: Diagnosis present

## 2018-08-27 DIAGNOSIS — J449 Chronic obstructive pulmonary disease, unspecified: Secondary | ICD-10-CM | POA: Diagnosis present

## 2018-08-27 DIAGNOSIS — Z8249 Family history of ischemic heart disease and other diseases of the circulatory system: Secondary | ICD-10-CM | POA: Diagnosis not present

## 2018-08-27 DIAGNOSIS — Z9049 Acquired absence of other specified parts of digestive tract: Secondary | ICD-10-CM | POA: Diagnosis not present

## 2018-08-27 DIAGNOSIS — I5043 Acute on chronic combined systolic (congestive) and diastolic (congestive) heart failure: Secondary | ICD-10-CM | POA: Diagnosis present

## 2018-08-27 DIAGNOSIS — Z794 Long term (current) use of insulin: Secondary | ICD-10-CM | POA: Diagnosis not present

## 2018-08-27 DIAGNOSIS — M069 Rheumatoid arthritis, unspecified: Secondary | ICD-10-CM | POA: Diagnosis present

## 2018-08-27 DIAGNOSIS — J811 Chronic pulmonary edema: Secondary | ICD-10-CM | POA: Diagnosis present

## 2018-08-27 DIAGNOSIS — F419 Anxiety disorder, unspecified: Secondary | ICD-10-CM | POA: Diagnosis present

## 2018-08-27 DIAGNOSIS — Z955 Presence of coronary angioplasty implant and graft: Secondary | ICD-10-CM | POA: Diagnosis not present

## 2018-08-27 DIAGNOSIS — Z9114 Patient's other noncompliance with medication regimen: Secondary | ICD-10-CM | POA: Diagnosis not present

## 2018-08-27 DIAGNOSIS — I252 Old myocardial infarction: Secondary | ICD-10-CM | POA: Diagnosis not present

## 2018-08-27 DIAGNOSIS — Z800 Family history of malignant neoplasm of digestive organs: Secondary | ICD-10-CM | POA: Diagnosis not present

## 2018-08-27 DIAGNOSIS — I110 Hypertensive heart disease with heart failure: Secondary | ICD-10-CM | POA: Diagnosis present

## 2018-08-27 DIAGNOSIS — E7800 Pure hypercholesterolemia, unspecified: Secondary | ICD-10-CM | POA: Diagnosis present

## 2018-08-27 DIAGNOSIS — R0602 Shortness of breath: Secondary | ICD-10-CM | POA: Diagnosis not present

## 2018-08-27 DIAGNOSIS — I509 Heart failure, unspecified: Secondary | ICD-10-CM | POA: Diagnosis present

## 2018-08-27 DIAGNOSIS — J9601 Acute respiratory failure with hypoxia: Secondary | ICD-10-CM | POA: Diagnosis present

## 2018-08-27 DIAGNOSIS — Z7902 Long term (current) use of antithrombotics/antiplatelets: Secondary | ICD-10-CM | POA: Diagnosis not present

## 2018-08-27 DIAGNOSIS — Z7982 Long term (current) use of aspirin: Secondary | ICD-10-CM | POA: Diagnosis not present

## 2018-08-27 DIAGNOSIS — Z801 Family history of malignant neoplasm of trachea, bronchus and lung: Secondary | ICD-10-CM | POA: Diagnosis not present

## 2018-08-27 DIAGNOSIS — K219 Gastro-esophageal reflux disease without esophagitis: Secondary | ICD-10-CM | POA: Diagnosis present

## 2018-08-27 DIAGNOSIS — E1151 Type 2 diabetes mellitus with diabetic peripheral angiopathy without gangrene: Secondary | ICD-10-CM | POA: Diagnosis present

## 2018-08-27 DIAGNOSIS — I5042 Chronic combined systolic (congestive) and diastolic (congestive) heart failure: Secondary | ICD-10-CM | POA: Diagnosis present

## 2018-08-27 DIAGNOSIS — I2510 Atherosclerotic heart disease of native coronary artery without angina pectoris: Secondary | ICD-10-CM | POA: Diagnosis present

## 2018-08-27 DIAGNOSIS — I255 Ischemic cardiomyopathy: Secondary | ICD-10-CM | POA: Diagnosis present

## 2018-08-27 LAB — BLOOD GAS, ARTERIAL
Acid-Base Excess: 1.6 mmol/L (ref 0.0–2.0)
Bicarbonate: 25.4 mmol/L (ref 20.0–28.0)
Delivery systems: POSITIVE
Drawn by: 213101
EXPIRATORY PAP: 6
FIO2: 50
Inspiratory PAP: 14
O2 Saturation: 97 %
PH ART: 7.392 (ref 7.350–7.450)
Patient temperature: 37
pCO2 arterial: 43.6 mmHg (ref 32.0–48.0)
pO2, Arterial: 91.4 mmHg (ref 83.0–108.0)

## 2018-08-27 LAB — BRAIN NATRIURETIC PEPTIDE: B Natriuretic Peptide: 247 pg/mL — ABNORMAL HIGH (ref 0.0–100.0)

## 2018-08-27 LAB — COMPREHENSIVE METABOLIC PANEL
ALT: 21 U/L (ref 0–44)
ALT: 22 U/L (ref 0–44)
AST: 17 U/L (ref 15–41)
AST: 20 U/L (ref 15–41)
Albumin: 3.4 g/dL — ABNORMAL LOW (ref 3.5–5.0)
Albumin: 3.7 g/dL (ref 3.5–5.0)
Alkaline Phosphatase: 63 U/L (ref 38–126)
Alkaline Phosphatase: 69 U/L (ref 38–126)
Anion gap: 7 (ref 5–15)
Anion gap: 8 (ref 5–15)
BUN: 15 mg/dL (ref 8–23)
BUN: 15 mg/dL (ref 8–23)
CALCIUM: 8.7 mg/dL — AB (ref 8.9–10.3)
CO2: 24 mmol/L (ref 22–32)
CO2: 27 mmol/L (ref 22–32)
Calcium: 8.9 mg/dL (ref 8.9–10.3)
Chloride: 104 mmol/L (ref 98–111)
Chloride: 105 mmol/L (ref 98–111)
Creatinine, Ser: 0.97 mg/dL (ref 0.61–1.24)
Creatinine, Ser: 1.17 mg/dL (ref 0.61–1.24)
GFR calc Af Amer: >60 mL/min (ref >60)
GFR calc non Af Amer: >60 mL/min (ref >60)
GFR calc non Af Amer: >60 mL/min (ref >60)
Glucose, Bld: 189 mg/dL — ABNORMAL HIGH (ref 70–99)
Glucose, Bld: 201 mg/dL — ABNORMAL HIGH (ref 70–99)
Potassium: 3.8 mmol/L (ref 3.5–5.1)
Potassium: 3.8 mmol/L (ref 3.5–5.1)
Sodium: 137 mmol/L (ref 135–145)
Sodium: 138 mmol/L (ref 135–145)
Total Bilirubin: 0.6 mg/dL (ref 0.3–1.2)
Total Bilirubin: 0.7 mg/dL (ref 0.3–1.2)
Total Protein: 6.3 g/dL — ABNORMAL LOW (ref 6.5–8.1)
Total Protein: 6.9 g/dL (ref 6.5–8.1)

## 2018-08-27 LAB — TROPONIN I
Troponin I: 0.1 ng/mL (ref <0.03)
Troponin I: 0.13 ng/mL (ref <0.03)
Troponin I: 0.1 ng/mL (ref <0.03)

## 2018-08-27 LAB — CBC WITH DIFFERENTIAL/PLATELET
ABS IMMATURE GRANULOCYTES: 0.02 10*3/uL (ref 0.00–0.07)
Basophils Absolute: 0.1 10*3/uL (ref 0.0–0.1)
Basophils Relative: 1 %
EOS PCT: 3 %
Eosinophils Absolute: 0.2 10*3/uL (ref 0.0–0.5)
HCT: 55 % — ABNORMAL HIGH (ref 39.0–52.0)
HEMOGLOBIN: 18.2 g/dL — AB (ref 13.0–17.0)
Immature Granulocytes: 0 %
LYMPHS ABS: 1.6 10*3/uL (ref 0.7–4.0)
LYMPHS PCT: 24 %
MCH: 31.2 pg (ref 26.0–34.0)
MCHC: 33.1 g/dL (ref 30.0–36.0)
MCV: 94.3 fL (ref 80.0–100.0)
MONO ABS: 0.5 10*3/uL (ref 0.1–1.0)
Monocytes Relative: 8 %
NEUTROS ABS: 4.4 10*3/uL (ref 1.7–7.7)
Neutrophils Relative %: 64 %
Platelets: 130 10*3/uL — ABNORMAL LOW (ref 150–400)
RBC: 5.83 MIL/uL — AB (ref 4.22–5.81)
RDW: 13.8 % (ref 11.5–15.5)
WBC: 6.7 10*3/uL (ref 4.0–10.5)
nRBC: 0 % (ref 0.0–0.2)

## 2018-08-27 LAB — CBC
HCT: 52.3 % — ABNORMAL HIGH (ref 39.0–52.0)
Hemoglobin: 17.1 g/dL — ABNORMAL HIGH (ref 13.0–17.0)
MCH: 30.5 pg (ref 26.0–34.0)
MCHC: 32.7 g/dL (ref 30.0–36.0)
MCV: 93.2 fL (ref 80.0–100.0)
Platelets: 118 10*3/uL — ABNORMAL LOW (ref 150–400)
RBC: 5.61 MIL/uL (ref 4.22–5.81)
RDW: 13.9 % (ref 11.5–15.5)
WBC: 7.3 10*3/uL (ref 4.0–10.5)
nRBC: 0 % (ref 0.0–0.2)

## 2018-08-27 LAB — MRSA PCR SCREENING: MRSA by PCR: NEGATIVE

## 2018-08-27 MED ORDER — FUROSEMIDE 10 MG/ML IJ SOLN
80.0000 mg | Freq: Once | INTRAMUSCULAR | Status: AC
  Administered 2018-08-27: 80 mg via INTRAVENOUS
  Filled 2018-08-27: qty 8

## 2018-08-27 MED ORDER — SODIUM CHLORIDE 0.9 % IV SOLN: 250.0000 mL | INTRAVENOUS | Status: DC | PRN

## 2018-08-27 MED ORDER — SODIUM CHLORIDE 0.9% FLUSH: 3.0000 mL | INTRAVENOUS | Status: DC | PRN

## 2018-08-27 MED ORDER — ONDANSETRON HCL 4 MG/2ML IJ SOLN: 4.0000 mg | Freq: Four times a day (QID) | INTRAMUSCULAR | Status: DC | PRN

## 2018-08-27 MED ORDER — POTASSIUM CHLORIDE CRYS ER 20 MEQ PO TBCR
20.0000 meq | EXTENDED_RELEASE_TABLET | Freq: Every day | ORAL | Status: DC
  Administered 2018-08-27 – 2018-08-28 (×2): 20 meq via ORAL
  Filled 2018-08-27 (×2): qty 1

## 2018-08-27 MED ORDER — ENOXAPARIN SODIUM 40 MG/0.4ML ~~LOC~~ SOLN
40.0000 mg | SUBCUTANEOUS | Status: DC
  Administered 2018-08-27 – 2018-08-28 (×2): 40 mg via SUBCUTANEOUS
  Filled 2018-08-27 (×2): qty 0.4

## 2018-08-27 MED ORDER — PRIMIDONE 50 MG PO TABS
100.0000 mg | ORAL_TABLET | Freq: Every day | ORAL | Status: DC
  Administered 2018-08-27 – 2018-08-28 (×2): 100 mg via ORAL
  Filled 2018-08-27 (×5): qty 2

## 2018-08-27 MED ORDER — OMEGA-3-ACID ETHYL ESTERS 1 G PO CAPS
1.0000 g | ORAL_CAPSULE | Freq: Every day | ORAL | Status: DC
  Administered 2018-08-27 – 2018-08-28 (×2): 1 g via ORAL
  Filled 2018-08-27 (×2): qty 1

## 2018-08-27 MED ORDER — SODIUM CHLORIDE 0.9% FLUSH
3.0000 mL | Freq: Two times a day (BID) | INTRAVENOUS | Status: DC
  Administered 2018-08-27 – 2018-08-28 (×4): 3 mL via INTRAVENOUS

## 2018-08-27 MED ORDER — INSULIN GLARGINE 100 UNIT/ML ~~LOC~~ SOLN
80.0000 [IU] | Freq: Every day | SUBCUTANEOUS | Status: DC
  Administered 2018-08-27 – 2018-08-28 (×2): 80 [IU] via SUBCUTANEOUS
  Filled 2018-08-27 (×4): qty 0.8

## 2018-08-27 MED ORDER — HYDRALAZINE HCL 25 MG PO TABS: 25.0000 mg | ORAL_TABLET | Freq: Four times a day (QID) | ORAL | Status: DC | PRN

## 2018-08-27 MED ORDER — FUROSEMIDE 10 MG/ML IJ SOLN
80.0000 mg | Freq: Two times a day (BID) | INTRAMUSCULAR | Status: DC
  Administered 2018-08-27 – 2018-08-28 (×3): 80 mg via INTRAVENOUS
  Filled 2018-08-27 (×3): qty 8

## 2018-08-27 MED ORDER — ASPIRIN EC 81 MG PO TBEC
81.0000 mg | DELAYED_RELEASE_TABLET | Freq: Every day | ORAL | Status: DC
  Administered 2018-08-27 – 2018-08-28 (×2): 81 mg via ORAL
  Filled 2018-08-27 (×2): qty 1

## 2018-08-27 MED ORDER — LOSARTAN POTASSIUM 25 MG PO TABS
12.5000 mg | ORAL_TABLET | Freq: Every day | ORAL | Status: DC
  Administered 2018-08-27 – 2018-08-28 (×2): 12.5 mg via ORAL
  Filled 2018-08-27 (×2): qty 1

## 2018-08-27 MED ORDER — ACETAMINOPHEN 325 MG PO TABS: 650.0000 mg | ORAL_TABLET | Freq: Three times a day (TID) | ORAL | Status: DC | PRN

## 2018-08-27 MED ORDER — ONDANSETRON HCL 4 MG PO TABS: 4.0000 mg | ORAL_TABLET | Freq: Four times a day (QID) | ORAL | Status: DC | PRN

## 2018-08-27 MED ORDER — GABAPENTIN 300 MG PO CAPS: 300.0000 mg | ORAL_CAPSULE | Freq: Three times a day (TID) | ORAL | Status: DC | PRN

## 2018-08-27 MED ORDER — ATORVASTATIN CALCIUM 40 MG PO TABS
80.0000 mg | ORAL_TABLET | Freq: Every day | ORAL | Status: DC
  Administered 2018-08-27: 80 mg via ORAL
  Filled 2018-08-27: qty 2

## 2018-08-27 MED ORDER — OMEGA-3 FATTY ACIDS 1000 MG PO CAPS: 1.0000 g | ORAL_CAPSULE | Freq: Every day | ORAL | Status: DC

## 2018-08-27 MED ORDER — CLOPIDOGREL BISULFATE 75 MG PO TABS
75.0000 mg | ORAL_TABLET | Freq: Every day | ORAL | Status: DC
  Administered 2018-08-27 – 2018-08-28 (×2): 75 mg via ORAL
  Filled 2018-08-27 (×2): qty 1

## 2018-08-27 MED ORDER — FUROSEMIDE 10 MG/ML IJ SOLN
40.0000 mg | Freq: Two times a day (BID) | INTRAMUSCULAR | Status: DC
  Administered 2018-08-27: 40 mg via INTRAVENOUS
  Filled 2018-08-27: qty 4

## 2018-08-27 MED ORDER — ALPRAZOLAM 0.5 MG PO TABS: 0.5000 mg | ORAL_TABLET | Freq: Three times a day (TID) | ORAL | Status: DC | PRN

## 2018-08-27 MED ORDER — METOPROLOL TARTRATE 25 MG PO TABS
25.0000 mg | ORAL_TABLET | Freq: Two times a day (BID) | ORAL | Status: DC
  Administered 2018-08-27 – 2018-08-28 (×4): 25 mg via ORAL
  Filled 2018-08-27 (×4): qty 1

## 2018-08-27 MED ORDER — ISOSORBIDE MONONITRATE ER 60 MG PO TB24
120.0000 mg | ORAL_TABLET | Freq: Every day | ORAL | Status: DC
  Administered 2018-08-27 – 2018-08-28 (×2): 120 mg via ORAL
  Filled 2018-08-27 (×2): qty 2

## 2018-08-27 NOTE — Progress Notes (Signed)
CRITICAL VALUE ALERT  Critical Value:  Troponin 0.10  Date & Time Notied:  08/27/18 @ 5320  Provider Notified: Dr. Darrick Meigs  Orders Received/Actions taken: Waiting for call back/orders

## 2018-08-27 NOTE — ED Notes (Signed)
Pt taken off of BI-PAP, oxygen dropped to 87% on room air. Pt put on 3L nasal canula, MD notified.

## 2018-08-27 NOTE — Progress Notes (Signed)
Patient admitted to the hospital earlier this morning by Dr. Darrick Meigs  Patient seen and examined.  He is now off BiPAP.  He has some crackles at his bases and 1+ lower extremity edema.  Patient admitted to the hospital earlier this morning with shortness of breath.  He was found to be in decompensated CHF.  He has been started on intravenous Lasix.  He briefly required BiPAP on admission, but has since been weaned to nasal cannula.  We will continue to wean down oxygen as tolerated.  Continue intravenous Lasix for now.  Echocardiogram from 05/2017 showed an EF of 40 to 45% and grade 1 diastolic dysfunction.  Will repeat echo.  [employer]

## 2018-08-27 NOTE — H&P (Signed)
TRH H&P    Patient Demographics:    Robert [name], is a 67 y.o. male  MRN: [id number]  DOB - [birth year]  Admit Date - 08/26/2018  Referring MD/NP/PA: Adriana Mccallum  Outpatient Primary MD for the patient is Redmond School, MD  Patient coming from: Home  Chief complaint-shortness of breath   HPI:    Robert [name]  is a 67 y.o. 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  Patient takes Lasix 80 mg at home daily, he did not take his usual dose yesterday. Patient became short of breath, EMS was called and they found that patient was sitting in tripod position gasping for air.  O2 sats on room air was 80%.  There was no improvement on nasal cannula oxygen so he was placed on CPAP with some improvement. In the ED at this time patient is on BiPAP He initially had chest pain but no chest pain at this time. Denies nausea vomiting or diarrhea. Denies coughing up any phlegm. Denies fever or chills. Denies dizziness or blurred vision. Chest x-ray showed findings consistent with CHF and moderate pulmonary edema. Lasix 80 mg IV x1 given in the ED.    Review of systems:    In addition to the HPI above,   All other systems reviewed and are negative.    Past History of the following :    Past Medical History:  Diagnosis Date  . Anxiety   . COPD (chronic obstructive pulmonary disease) ([place])   . Coronary artery disease   . Full dentures   . GERD (gastroesophageal reflux disease)    Rolaids as needed  . High cholesterol   . History of MI (myocardial infarction)   . Hypertension    med. dosage increased 04/2016; has been on BP med. x 10 yrs.  .  Incarcerated umbilical hernia 62/9528  . Inguinal hernia 05/2016   bilateral   . Insulin dependent diabetes mellitus ([place])   . Ischemic cardiomyopathy    a. 03/2015 EF 40-45% by LV gram.  . Left leg cellulitis 10/08/2016  . Rheumatoid arthritis(714.0)       Past Surgical History:  Procedure Laterality Date  . CARDIAC CATHETERIZATION N/A 02/01/2015   Procedure: Left Heart Cath and Coronary Angiography;  Surgeon: Belva Crome, MD;  Location: [place] [facility];  Service: Cardiovascular;  Laterality: N/A;  . CARDIAC CATHETERIZATION N/A 03/22/2015   Procedure: Left Heart Cath and Coronary Angiography;  Surgeon: Leonie Man, MD;  Location: [place] [facility];  Service: Cardiovascular;  Laterality: N/A;  . CARDIAC CATHETERIZATION  09/05/2002; 03/09/2003; 04/10/2005;06/02/2005; 05/09/2006; 02/09/2007; 03/01/2007  . COLONOSCOPY  2008   Dr. Gala Romney: diverticulosis, hyperplastic polyp.  . CORONARY ANGIOPLASTY  11/14/2012; 02/01/2015  . CORONARY ANGIOPLASTY WITH STENT PLACEMENT  05/16/2012   "1; makes total ~ 4"  . CORONARY  STENT INTERVENTION N/A 06/06/2017   Procedure: CORONARY STENT INTERVENTION;  Surgeon: Belva Crome, MD;  Location: [place] [facility];  Service: Cardiovascular;  Laterality: N/A;  . INSERTION OF MESH Bilateral 05/24/2016   Procedure: INSERTION OF MESH;  Surgeon: Coralie Keens, MD;  Location: [place];  Service: General;  Laterality: Bilateral;  . LAPAROSCOPIC CHOLECYSTECTOMY    . LAPAROSCOPIC INGUINAL HERNIA WITH UMBILICAL HERNIA Bilateral 05/24/2016   Procedure: BILATERAL LAPAROSCOPIC INGUINAL HERNIA REPAIR WITH MESH AND UMBILICAL HERNIA REPAIR WITH MESH;  Surgeon: Coralie Keens, MD;  Location: [place];  Service: General;  Laterality: Bilateral;  . LEFT HEART CATH AND CORONARY ANGIOGRAPHY N/A 06/06/2017   Procedure: LEFT HEART CATH AND CORONARY ANGIOGRAPHY;  Surgeon: Belva Crome, MD;  Location: [place] [facility];  Service: Cardiovascular;   Laterality: N/A;  . LEFT HEART CATHETERIZATION WITH CORONARY ANGIOGRAM N/A 12/22/2011   Procedure: LEFT HEART CATHETERIZATION WITH CORONARY ANGIOGRAM;  Surgeon: Troy Sine, MD;  Location: [facility] CATH LAB;  Service: Cardiovascular;  Laterality: N/A;  . LEFT HEART CATHETERIZATION WITH CORONARY ANGIOGRAM Bilateral 05/16/2012   Procedure: LEFT HEART CATHETERIZATION WITH CORONARY ANGIOGRAM;  Surgeon: Lorretta Harp, MD;  Location: [facility] CATH LAB;  Service: Cardiovascular;  Laterality: Bilateral;  . LEFT HEART CATHETERIZATION WITH CORONARY ANGIOGRAM N/A 11/14/2012   Procedure: LEFT HEART CATHETERIZATION WITH CORONARY ANGIOGRAM;  Surgeon: Troy Sine, MD;  Location: [facility] CATH LAB;  Service: Cardiovascular;  Laterality: N/A;  . LEFT HEART CATHETERIZATION WITH CORONARY ANGIOGRAM N/A 09/01/2013   Procedure: LEFT HEART CATHETERIZATION WITH CORONARY ANGIOGRAM;  Surgeon: Leonie Man, MD;  Location: [facility] CATH LAB;  Service: Cardiovascular;  Laterality: N/A;  . [place]; 1973; 1985   x 3  . NM MYOCAR PERF WALL MOTION  08/30/2009   No significant ischemia  . OPEN REDUCTION INTERNAL FIXATION (ORIF) DISTAL RADIAL FRACTURE Right 12/10/2016   Procedure: OPEN REDUCTION INTERNAL FIXATION (ORIF) DISTAL RADIAL FRACTURE;  Surgeon: Iran Planas, MD;  Location: [place];  Service: Orthopedics;  Laterality: Right;  . PERCUTANEOUS CORONARY INTERVENTION-BALLOON ONLY  12/22/2011   Procedure: PERCUTANEOUS CORONARY INTERVENTION-BALLOON ONLY;  Surgeon: Troy Sine, MD;  Location: [facility] CATH LAB;  Service: Cardiovascular;;  . PERCUTANEOUS CORONARY INTERVENTION-BALLOON ONLY  11/14/2012   Procedure: PERCUTANEOUS CORONARY INTERVENTION-BALLOON ONLY;  Surgeon: Troy Sine, MD;  Location: [facility] CATH LAB;  Service: Cardiovascular;;  . PERCUTANEOUS CORONARY STENT INTERVENTION (PCI-S)  05/16/2012   Procedure: PERCUTANEOUS CORONARY STENT INTERVENTION (PCI-S);  Surgeon: Lorretta Harp, MD;  Location: [facility] CATH LAB;  Service:  Cardiovascular;;  . PERCUTANEOUS CORONARY STENT INTERVENTION (PCI-S)  09/01/2013   Procedure: PERCUTANEOUS CORONARY STENT INTERVENTION (PCI-S);  Surgeon: Leonie Man, MD;  Location: [facility] CATH LAB;  Service: Cardiovascular;;      Social History:      Social History   Tobacco Use  . Smoking status: Current Some Day Smoker    Packs/day: 1.00    Types: Cigarettes    Last attempt to quit: 06/11/2015    Years since quitting: 3.2  . Smokeless tobacco: Never Used  . Tobacco comment: pt states smoking less than half a pack  Substance Use Topics  . Alcohol use: No       Family History :     Family History  Problem Relation Age of Onset  . Heart disease Father   . Colon cancer Brother        early 72s  . Hypertension Mother   .  Heart disease Mother   . Colon cancer Mother        died age 49. late 27s early 47s at diagnosis.   . Lung cancer Mother       Home Medications:   Prior to Admission medications   Medication Sig Start Date End Date Taking? Authorizing Provider  acarbose (PRECOSE) 50 MG tablet Take 50 mg by mouth 3 (three) times daily with meals.  03/08/17   [provider]  acetaminophen (TYLENOL) 650 MG CR tablet Take 650 mg by mouth every 8 (eight) hours as needed for pain.    [provider]  ALPRAZolam Duanne Moron) 0.5 MG tablet Take 0.5 mg by mouth 3 (three) times daily as needed for anxiety.     [provider]  AMBULATORY NON FORMULARY MEDICATION Take 600 mg by mouth daily at 12 noon. Medication Name: Dalcetrapib vs placebo Patient taking differently: Take 600 mg by mouth every evening. Medication Name: Dalcetrapib vs placebo: Cholesterol 12/15/16   Jettie Booze, MD  aspirin EC 81 MG EC tablet Take 1 tablet (81 mg total) by mouth daily. 11/15/12   Brett Canales, PA-C  atorvastatin (LIPITOR) 80 MG tablet TAKE 1 TABLET DAILY AT 6 PM. 12/21/17   Croitoru, Dani Gobble, MD  clopidogrel (PLAVIX) 75 MG tablet Take 1 tablet (75 mg total) by mouth  daily. 03/28/18   Croitoru, Mihai, MD  fish oil-omega-3 fatty acids 1000 MG capsule Take 1 g by mouth daily.     [provider]  folic acid (FOLVITE) 1 MG tablet Take 1 mg by mouth daily.     [provider]  furosemide (LASIX) 40 MG tablet Take 2 tablets (80 mg total) by mouth daily. 09/28/17   Croitoru, Mihai, MD  gabapentin (NEURONTIN) 300 MG capsule Take 300 mg by mouth 3 (three) times daily as needed (for pain).  03/10/17   [provider]  glipiZIDE (GLUCOTROL) 10 MG tablet Take 10 mg by mouth 2 (two) times daily. 02/12/17   [provider]  Insulin Glargine (TOUJEO SOLOSTAR [place]) Inject 80 Units into the skin daily.    [provider]  isosorbide mononitrate (IMDUR) 120 MG 24 hr tablet TAKE 1 TABLET EVERY DAY 01/08/18   Croitoru, Mihai, MD  losartan (COZAAR) 25 MG tablet Take 0.5 tablets (12.5 mg total) by mouth daily. 06/12/17   Leanor Kail, PA  metFORMIN (GLUCOPHAGE) 1000 MG tablet Take 1 tablet (1,000 mg total) by mouth 2 (two) times daily with a meal. Patient taking differently: Take 1,000 mg by mouth 2 (two) times daily with a meal. 1.5 tablets in am and 1 tablet at night 03/23/15   Bhagat, [place], PA  methotrexate 50 MG/2ML injection Inject 2 mLs into the skin once a week. Takes every Thursday 12/01/17   [provider]  metoprolol tartrate (LOPRESSOR) 25 MG tablet TAKE 1 TABLET TWICE DAILY 01/07/18   Croitoru, Dani Gobble, MD  nitroGLYCERIN (NITROSTAT) 0.4 MG SL tablet Place 1 tablet (0.4 mg total) under the tongue every 5 (five) minutes x 3 doses as needed for chest pain. 04/24/16   Croitoru, Mihai, MD  potassium chloride SA (K-DUR,KLOR-CON) 20 MEQ tablet Take 2 tablets on day one and then 1 tablet by mouth daily thereafter. Patient taking differently: as needed. Take 2 tablets on day one and then 1 tablet by mouth daily thereafter. Doesn't take everyday 10/05/17   Croitoru, Dani Gobble, MD  primidone (MYSOLINE) 50 MG tablet Take 100 mg by  mouth daily.  [provider]  Tocilizumab 162 MG/0.9ML SOSY Inject 162 mg into the skin every Monday. ACTEMRA    [provider]     Allergies:     Allergies  Allergen Reactions  . Ivp Dye [Iodinated Diagnostic Agents] Itching and Rash     Physical Exam:   Vitals  Blood pressure (!) 160/85, pulse 86, resp. rate (!) 26, height 6' (1.829 m), weight 106.6 kg, SpO2 99 %.  1.  General: Patient on BiPAP  2. Psychiatric:  Intact judgement and  insight, awake alert, oriented x 3.  3. Neurologic: No focal neurological deficits, all cranial nerves intact.Strength 5/5 all 4 extremities, sensation intact all 4 extremities, plantars down going.  4. Eyes :  anicteric sclerae, moist conjunctivae with no lid lag. PERRLA.  5. ENMT:  Oropharynx clear with moist mucous membranes and good dentition  6. Neck:  supple, no cervical lymphadenopathy appriciated, No thyromegaly  7. Respiratory : Normal respiratory effort, faint crackles at lung bases  8. Cardiovascular : RRR, no gallops, rubs or murmurs, no leg edema  9. Gastrointestinal:  Positive bowel sounds, abdomen soft, non-tender to palpation,no hepatosplenomegaly, no rigidity or guarding       10. Skin:  No cyanosis, normal texture and turgor, no rash, lesions or ulcers  11.Musculoskeletal:  Good muscle tone,  joints appear normal , no effusions,  normal range of motion    Data Review:    CBC Recent Labs  Lab 08/26/18 2359  WBC 6.7  HGB 18.2*  HCT 55.0*  PLT 130*  MCV 94.3  MCH 31.2  MCHC 33.1  RDW 13.8  LYMPHSABS 1.6  MONOABS 0.5  EOSABS 0.2  BASOSABS 0.1   ------------------------------------------------------------------------------------------------------------------  Results for orders placed or performed during the hospital encounter of 08/26/18 (from the past 48 hour(s))  Blood gas, arterial     Status: None   Collection Time: 08/26/18 11:55 PM  Result Value Ref Range   FIO2  50.00    Delivery systems BILEVEL POSITIVE AIRWAY PRESSURE    Inspiratory PAP 14.0    Expiratory PAP 6.0    pH, Arterial 7.392 7.350 - 7.450   pCO2 arterial 43.6 32.0 - 48.0 mmHg   pO2, Arterial 91.4 83.0 - 108.0 mmHg   Bicarbonate 25.4 20.0 - 28.0 mmol/L   Acid-Base Excess 1.6 0.0 - 2.0 mmol/L   O2 Saturation 97.0 %   Patient temperature 37.0    Collection site LEFT RADIAL    Drawn by [id number]    Sample type ARTERIAL    Allens test (pass/fail) PASS PASS    Comment: Performed at [facility], [address], [place], [place] [address]  CBC with Differential/Platelet     Status: Abnormal   Collection Time: 08/26/18 11:59 PM  Result Value Ref Range   WBC 6.7 4.0 - 10.5 K/uL   RBC 5.83 (H) 4.22 - 5.81 MIL/uL   Hemoglobin 18.2 (H) 13.0 - 17.0 g/dL   HCT 55.0 (H) 39.0 - 52.0 %   MCV 94.3 80.0 - 100.0 fL   MCH 31.2 26.0 - 34.0 pg   MCHC 33.1 30.0 - 36.0 g/dL   RDW 13.8 11.5 - 15.5 %   Platelets 130 (L) 150 - 400 K/uL    Comment: SPECIMEN CHECKED FOR CLOTS   nRBC 0.0 0.0 - 0.2 %   Neutrophils Relative % 64 %   Neutro Abs 4.4 1.7 - 7.7 K/uL   Lymphocytes Relative 24 %   Lymphs Abs 1.6 0.7 - 4.0 K/uL  Monocytes Relative 8 %   Monocytes Absolute 0.5 0.1 - 1.0 K/uL   Eosinophils Relative 3 %   Eosinophils Absolute 0.2 0.0 - 0.5 K/uL   Basophils Relative 1 %   Basophils Absolute 0.1 0.0 - 0.1 K/uL   Immature Granulocytes 0 %   Abs Immature Granulocytes 0.02 0.00 - 0.07 K/uL    Comment: Performed at [facility], [address], [place], [place] [address]  Comprehensive metabolic panel     Status: Abnormal   Collection Time: 08/26/18 11:59 PM  Result Value Ref Range   Sodium 137 135 - 145 mmol/L   Potassium 3.8 3.5 - 5.1 mmol/L   Chloride 105 98 - 111 mmol/L   CO2 24 22 - 32 mmol/L   Glucose, Bld 201 (H) 70 - 99 mg/dL   BUN 15 8 - 23 mg/dL   Creatinine, Ser 1.17 0.61 - 1.24 mg/dL   Calcium 8.9 8.9 - 10.3 mg/dL   Total Protein 6.9 6.5 - 8.1 g/dL   Albumin 3.7 3.5 - 5.0 g/dL    AST 20 15 - 41 U/L   ALT 22 0 - 44 U/L   Alkaline Phosphatase 69 38 - 126 U/L   Total Bilirubin 0.7 0.3 - 1.2 mg/dL   GFR calc non Af Amer >60 >60 mL/min   GFR calc Af Amer >60 >60 mL/min   Anion gap 8 5 - 15    Comment: Performed at [facility], [address], [place], [place] [address]  Troponin I - ONCE - STAT     Status: None   Collection Time: 08/26/18 11:59 PM  Result Value Ref Range   Troponin I <0.03 <0.03 ng/mL    Comment: Performed at [facility], [address] [place], [place] [address]  Brain natriuretic peptide     Status: Abnormal   Collection Time: 08/26/18 11:59 PM  Result Value Ref Range   B Natriuretic Peptide 247.0 (H) 0.0 - 100.0 pg/mL    Comment: Performed at [facility], [address] [place], [place] [address]    Chemistries  Recent Labs  Lab 08/26/18 2359  NA 137  K 3.8  CL 105  CO2 24  GLUCOSE 201*  BUN 15  CREATININE 1.17  CALCIUM 8.9  AST 20  ALT 22  ALKPHOS 69  BILITOT 0.7   ------------------------------------------------------------------------------------------------------------------  ------------------------------------------------------------------------------------------------------------------ GFR: Estimated Creatinine Clearance: 79.4 mL/min (by C-G formula based on SCr of 1.17 mg/dL). Liver Function Tests: Recent Labs  Lab 08/26/18 2359  AST 20  ALT 22  ALKPHOS 69  BILITOT 0.7  PROT 6.9  ALBUMIN 3.7   No results for input(s): LIPASE, AMYLASE in the last 168 hours. No results for input(s): AMMONIA in the last 168 hours. Coagulation Profile: No results for input(s): INR, PROTIME in the last 168 hours. Cardiac Enzymes: Recent Labs  Lab 08/26/18 2359  TROPONINI <0.03   BNP (last 3 results) Recent Labs    09/25/17 1135  PROBNP 396*   HbA1C: No results for input(s): HGBA1C in the last 72 hours. CBG: No results for input(s): GLUCAP in the last 168 hours. Lipid Profile: No results for input(s): CHOL, HDL,  LDLCALC, TRIG, CHOLHDL, LDLDIRECT in the last 72 hours. Thyroid Function Tests: No results for input(s): TSH, T4TOTAL, FREET4, T3FREE, THYROIDAB in the last 72 hours. Anemia Panel: No results for input(s): VITAMINB12, FOLATE, FERRITIN, TIBC, IRON, RETICCTPCT in the last 72 hours.  --------------------------------------------------------------------------------------------------------------- Urine analysis:    Component Value Date/Time   COLORURINE YELLOW 12/22/2011  2130   APPEARANCEUR CLEAR 12/22/2011 2130   LABSPEC 1.038 (H) 12/22/2011 2130   PHURINE 5.5 12/22/2011 2130   GLUCOSEU >1000 (A) 12/22/2011 2130   HGBUR NEGATIVE 12/22/2011 2130   BILIRUBINUR NEGATIVE 12/22/2011 2130   KETONESUR 15 (A) 12/22/2011 2130   PROTEINUR NEGATIVE 12/22/2011 2130   UROBILINOGEN 0.2 12/22/2011 2130   NITRITE NEGATIVE 12/22/2011 2130   LEUKOCYTESUR NEGATIVE 12/22/2011 2130      Imaging Results:    Dg Chest Port 1 View  Result Date: 08/27/2018 CLINICAL DATA:  Sudden onset of shortness of breath. Cough. EXAM: PORTABLE CHEST 1 VIEW COMPARISON:  Radiographs and CT 12/09/2016 FINDINGS: Cardiomegaly is similar. Unchanged mediastinal contours with aortic atherosclerosis. Increased peribronchial thickening and interstitial opacities with Kerley B-lines consistent pulmonary edema. Small left and possibly trace right pleural effusion. Streaky bibasilar atelectasis. No pneumothorax. IMPRESSION: Findings consistent with CHF with moderate pulmonary edema. Electronically Signed   By: Keith Rake M.D.   On: 08/27/2018 00:43    My personal review of EKG: Rhythm NSR, no ST-T changes   Assessment & Plan:    Active Problems:   Pulmonary edema   1. Acute hypoxic respiratory failure-secondary to pulmonary edema from underlying CHF.  Patient is placed on BiPAP, started on IV Lasix.  Will monitor closely in stepdown unit.  2. Acute on chronic combined systolic and diastolic CHF-patient has combined systolic  and diastolic CHF, echocardiogram in September 2018 showed mild LVH, grade 1 diastolic dysfunction, EF 40 to 45%.  BNP today is 247.  Patient given Lasix 80 mg IV in the ED.  We will start with Lasix a 40 mg every 12 hours from tomorrow morning.  Strict intake and output.  Follow BMP in a.m.  3. Hypertensive urgency-blood pressure was elevated at the time of presentation in the ED.  Blood pressure is stable now, continue home medications we will also start hydralazine 25 mg p.o. every 6 hours PRN for BP greater than 160/100.  4. Diabetes mellitus type 2-hold oral hypoglycemic agents, will start Lantus 80 units subcu daily.  Start sliding scale insulin with NovoLog.  5. COPD-appear stable, no exacerbation.    6. Rheumatoid arthritis-patient takes Actemra, methotrexate weekly.  7. Peripheral arterial disease-stable he has normal right ABI, left ABI 0.61.  Managed medically.    DVT Prophylaxis-   Lovenox   AM Labs Ordered, also please review Full Orders  Family Communication: Admission, patients condition and plan of care including tests being ordered have been discussed with the patient  who indicate understanding and agree with the plan and Code Status.  Code Status: Full code  Admission status: Inpatient: Based on patients clinical presentation and evaluation of above clinical data, I have made determination that patient meets Inpatient criteria at this time.  Patient will need more than 2 midnight stay in the hospital.  Started on IV Lasix for pulmonary edema.  Required BiPAP in the ED.  Will be admitted to stepdown unit.  Time spent in minutes : 60 minutes   Oswald Hillock M.D on 08/27/2018 at 1:36 AM  Between 7am to 7pm - Pager - [phone]. After 7pm go to [email or web address] - password [facility]   [facility] - Office  [phone]

## 2018-08-28 ENCOUNTER — Inpatient Hospital Stay (HOSPITAL_COMMUNITY): Payer: Medicare HMO

## 2018-08-28 DIAGNOSIS — M069 Rheumatoid arthritis, unspecified: Secondary | ICD-10-CM

## 2018-08-28 DIAGNOSIS — J9601 Acute respiratory failure with hypoxia: Secondary | ICD-10-CM

## 2018-08-28 DIAGNOSIS — R0602 Shortness of breath: Secondary | ICD-10-CM

## 2018-08-28 DIAGNOSIS — I5043 Acute on chronic combined systolic (congestive) and diastolic (congestive) heart failure: Secondary | ICD-10-CM

## 2018-08-28 DIAGNOSIS — E785 Hyperlipidemia, unspecified: Secondary | ICD-10-CM

## 2018-08-28 DIAGNOSIS — Z794 Long term (current) use of insulin: Secondary | ICD-10-CM

## 2018-08-28 DIAGNOSIS — I10 Essential (primary) hypertension: Secondary | ICD-10-CM

## 2018-08-28 DIAGNOSIS — E1151 Type 2 diabetes mellitus with diabetic peripheral angiopathy without gangrene: Secondary | ICD-10-CM

## 2018-08-28 LAB — BASIC METABOLIC PANEL
Anion gap: 9 (ref 5–15)
BUN: 14 mg/dL (ref 8–23)
CALCIUM: 8.9 mg/dL (ref 8.9–10.3)
CHLORIDE: 100 mmol/L (ref 98–111)
CO2: 28 mmol/L (ref 22–32)
CREATININE: 0.81 mg/dL (ref 0.61–1.24)
GFR calc Af Amer: >60 mL/min (ref >60)
GFR calc non Af Amer: >60 mL/min (ref >60)
Glucose, Bld: 74 mg/dL (ref 70–99)
Potassium: 3.3 mmol/L — ABNORMAL LOW (ref 3.5–5.1)
Sodium: 137 mmol/L (ref 135–145)

## 2018-08-28 LAB — ECHOCARDIOGRAM COMPLETE
Height: 72 in
WEIGHTICAEL: 3689.62 [oz_av]

## 2018-08-28 LAB — HIV ANTIBODY (ROUTINE TESTING W REFLEX): HIV Screen 4th Generation wRfx: NONREACTIVE

## 2018-08-28 MED ORDER — FUROSEMIDE 40 MG PO TABS: ORAL_TABLET | ORAL | 3 refills | Status: DC

## 2018-08-28 MED ORDER — POTASSIUM CHLORIDE CRYS ER 20 MEQ PO TBCR
40.0000 meq | EXTENDED_RELEASE_TABLET | Freq: Once | ORAL | Status: AC
  Administered 2018-08-28: 40 meq via ORAL
  Filled 2018-08-28: qty 2

## 2018-08-28 NOTE — Progress Notes (Signed)
Patient states understanding of discharge instructions.  

## 2018-08-28 NOTE — Progress Notes (Signed)
*  PRELIMINARY RESULTS* Echocardiogram 2D Echocardiogram has been performed.  Robert [name] 08/28/2018, 9:51 AM

## 2018-08-28 NOTE — Discharge Summary (Signed)
Physician Discharge Summary  Robert [name] LNL:[id number] DOB: 11/15/1950 DOA: 08/26/2018  PCP: Redmond School, MD  Admit date: 08/26/2018 Discharge date: 08/28/2018  Admitted From: Home Disposition: Home  Recommendations for Outpatient Follow-up:  1. Follow up with PCP in 1-2 weeks 2. Please obtain BMP/CBC in one week 3. Follow-up with primary cardiologist in the next 1 to 2 weeks  Home Health: Home health RN Equipment/Devices:  Discharge Condition: Stable CODE STATUS: Full code Diet recommendation: Heart healthy, carb modified  Brief/Interim Summary: 67 year old male with a history of hypertension, diabetes, chronic diastolic congestive heart failure, was admitted to the hospital with progressive shortness of breath.  He was found to be in decompensated CHF and initially required BiPAP.  He was admitted to the stepdown unit for further management.  After receiving intravenous Lasix, he significantly improved.  His weight is down approximately 5 pounds since admission.  The patient is feeling back to his baseline and is anxious to discharge home.  His discharge weight is 104.6 kg.  When asked about medication compliance, he reports that he does not take his Lasix on a regular basis.  Even though it is prescribed daily, he will take it approximately 4 times a week.  He has been counseled on the importance of medication compliance and to take his medications daily.  He is also been advised to weigh himself daily.  Home health RN has been ordered to help the patient manage his CHF and for further education.  Will increase Lasix dose to 80 mg twice daily for the next 5 days and then return to once daily.  The remainder of his medications were continued.  Echocardiogram was repeated and ejection fraction has improved from study done last year.  The remainder of his medical problems remained stable.  Discharge Diagnoses:  Active Problems:   Rheumatoid arthritis ([place])   CAD (coronary artery  disease)   Controlled type 2 diabetes mellitus with diabetic peripheral angiopathy without gangrene, with long-term current use of insulin (HCC)   Essential hypertension   Dyslipidemia (high LDL; low HDL)   Pulmonary edema   Acute respiratory failure with hypoxia (HCC)   Acute on chronic combined systolic and diastolic CHF (congestive heart failure) [facility])    Discharge Instructions  Discharge Instructions    Diet - low sodium heart healthy   Complete by:  As directed    Increase activity slowly   Complete by:  As directed      Allergies as of 08/28/2018      Reactions   Ivp Dye [iodinated Diagnostic Agents] Itching, Rash      Medication List    TAKE these medications   acarbose 50 MG tablet Commonly known as:  PRECOSE Take 50 mg by mouth 3 (three) times daily with meals.   acetaminophen 650 MG CR tablet Commonly known as:  TYLENOL Take 650 mg by mouth every 8 (eight) hours as needed for pain.   ALPRAZolam 0.5 MG tablet Commonly known as:  XANAX Take 0.5 mg by mouth 3 (three) times daily as needed for anxiety.   AMBULATORY NON FORMULARY MEDICATION Take 600 mg by mouth daily at 12 noon. Medication Name: Dalcetrapib vs placebo What changed:    when to take this  additional instructions   aspirin 81 MG EC tablet Take 1 tablet (81 mg total) by mouth daily.   atorvastatin 80 MG tablet Commonly known as:  LIPITOR TAKE 1 TABLET DAILY AT 6 PM.   clopidogrel 75 MG tablet Commonly known  as:  PLAVIX Take 1 tablet (75 mg total) by mouth daily.   fish oil-omega-3 fatty acids 1000 MG capsule Take 1 g by mouth daily.   folic acid 1 MG tablet Commonly known as:  FOLVITE Take 1 mg by mouth daily.   furosemide 40 MG tablet Commonly known as:  LASIX Take 80mg  po bid for 5 days then 80mg  po daily What changed:    how much to take  how to take this  when to take this  additional instructions   gabapentin 300 MG capsule Commonly known as:  NEURONTIN Take 300 mg  by mouth 3 (three) times daily as needed (for pain).   glipiZIDE 10 MG tablet Commonly known as:  GLUCOTROL Take 10 mg by mouth 2 (two) times daily.   isosorbide mononitrate 120 MG 24 hr tablet Commonly known as:  IMDUR TAKE 1 TABLET EVERY DAY   losartan 25 MG tablet Commonly known as:  COZAAR Take 0.5 tablets (12.5 mg total) by mouth daily.   metFORMIN 1000 MG tablet Commonly known as:  GLUCOPHAGE Take 1 tablet (1,000 mg total) by mouth 2 (two) times daily with a meal. What changed:  additional instructions   methotrexate 50 MG/2ML injection Inject 2 mLs into the skin once a week. Takes every Thursday   metoprolol tartrate 25 MG tablet Commonly known as:  LOPRESSOR TAKE 1 TABLET TWICE DAILY   nitroGLYCERIN 0.4 MG SL tablet Commonly known as:  NITROSTAT Place 1 tablet (0.4 mg total) under the tongue every 5 (five) minutes x 3 doses as needed for chest pain.   potassium chloride SA 20 MEQ tablet Commonly known as:  K-DUR,KLOR-CON Take 2 tablets on day one and then 1 tablet by mouth daily thereafter. What changed:    when to take this  reasons to take this  additional instructions   primidone 50 MG tablet Commonly known as:  MYSOLINE Take 100 mg by mouth daily.   Tocilizumab 162 MG/0.9ML [place] Inject 162 mg into the skin every Monday. ACTEMRA   TOUJEO SOLOSTAR [place] Inject 80 Units into the skin daily.       Allergies  Allergen Reactions  . Ivp Dye [Iodinated Diagnostic Agents] Itching and Rash    Consultations:     Procedures/Studies: Dg Chest Port 1 View  Result Date: 08/27/2018 CLINICAL DATA:  Sudden onset of shortness of breath. Cough. EXAM: PORTABLE CHEST 1 VIEW COMPARISON:  Radiographs and CT 12/09/2016 FINDINGS: Cardiomegaly is similar. Unchanged mediastinal contours with aortic atherosclerosis. Increased peribronchial thickening and interstitial opacities with Kerley B-lines consistent pulmonary edema. Small left and possibly trace right pleural  effusion. Streaky bibasilar atelectasis. No pneumothorax. IMPRESSION: Findings consistent with CHF with moderate pulmonary edema. Electronically Signed   By: Keith Rake M.D.   On: 08/27/2018 00:43    Echo: - Left ventricle: The cavity size was normal. Wall thickness was   increased in a pattern of moderate LVH. Systolic function was   normal. The estimated ejection fraction was in the range of 50%   to 55%. Features are consistent with a pseudonormal left   ventricular filling pattern, with concomitant abnormal relaxation   and increased filling pressure (grade 2 diastolic dysfunction). - Aortic valve: Valve area (VTI): 2.23 cm^2. Valve area (Vmax):   2.36 cm^2. - Left atrium: The atrium was moderately dilated.   Subjective: Feeling better today.  Shortness of breath is better.  Able to ambulate without difficulty.  Feels that her breathing is back to baseline.  Slept  with his bed flat last night and did not have any orthopnea.  Wants to go home  Discharge Exam: Vitals:   08/27/18 1600 08/27/18 2104 08/28/18 0620 08/28/18 1329  BP: (!) 125/93 117/68 119/73 125/71  Pulse: 69 65 63 69  Resp: 20 17 18 18   Temp: 98.3 F (36.8 C) 98 F (36.7 C) 98.1 F (36.7 C) 98.5 F (36.9 C)  TempSrc:  Oral Oral Oral  SpO2: 95% 96% 98% 93%  Weight:   104.6 kg   Height:        General: Pt is alert, awake, not in acute distress Cardiovascular: RRR, S1/S2 +, no rubs, no gallops Respiratory: CTA bilaterally, no wheezing, no rhonchi Abdominal: Soft, NT, ND, bowel sounds + Extremities: 1+ edema, no cyanosis    The results of significant diagnostics from this hospitalization (including imaging, microbiology, ancillary and laboratory) are listed below for reference.     Microbiology: Recent Results (from the past 240 hour(s))  MRSA PCR Screening     Status: None   Collection Time: 08/27/18  2:43 AM  Result Value Ref Range Status   MRSA by PCR NEGATIVE NEGATIVE Final    Comment:         The GeneXpert MRSA Assay (FDA approved for NASAL specimens only), is one component of a comprehensive MRSA colonization surveillance program. It is not intended to diagnose MRSA infection nor to guide or monitor treatment for MRSA infections. Performed at [facility], [address], [place], [place] [address]      Labs: BNP (last 3 results) Recent Labs    08/26/18 2359  BNP 532.9*   Basic Metabolic Panel: Recent Labs  Lab 08/26/18 2359 08/27/18 0543 08/28/18 0447  NA 137 138 137  K 3.8 3.8 3.3*  CL 105 104 100  CO2 24 27 28   GLUCOSE 201* 189* 74  BUN 15 15 14   CREATININE 1.17 0.97 0.81  CALCIUM 8.9 8.7* 8.9   Liver Function Tests: Recent Labs  Lab 08/26/18 2359 08/27/18 0543  AST 20 17  ALT 22 21  ALKPHOS 69 63  BILITOT 0.7 0.6  PROT 6.9 6.3*  ALBUMIN 3.7 3.4*   No results for input(s): LIPASE, AMYLASE in the last 168 hours. No results for input(s): AMMONIA in the last 168 hours. CBC: Recent Labs  Lab 08/26/18 2359 08/27/18 0543  WBC 6.7 7.3  NEUTROABS 4.4  --   HGB 18.2* 17.1*  HCT 55.0* 52.3*  MCV 94.3 93.2  PLT 130* 118*   Cardiac Enzymes: Recent Labs  Lab 08/26/18 2359 08/27/18 0543 08/27/18 1226 08/27/18 1744  TROPONINI <0.03 0.10* 0.13* 0.10*   BNP: Invalid input(s): POCBNP CBG: No results for input(s): GLUCAP in the last 168 hours. D-Dimer No results for input(s): DDIMER in the last 72 hours. Hgb A1c No results for input(s): HGBA1C in the last 72 hours. Lipid Profile No results for input(s): CHOL, HDL, LDLCALC, TRIG, CHOLHDL, LDLDIRECT in the last 72 hours. Thyroid function studies No results for input(s): TSH, T4TOTAL, T3FREE, THYROIDAB in the last 72 hours.  Invalid input(s): FREET3 Anemia work up No results for input(s): VITAMINB12, FOLATE, FERRITIN, TIBC, IRON, RETICCTPCT in the last 72 hours. Urinalysis    Component Value Date/Time   COLORURINE YELLOW 12/22/2011 2130   APPEARANCEUR CLEAR 12/22/2011 2130    LABSPEC 1.038 (H) 12/22/2011 2130   PHURINE 5.5 12/22/2011 2130   GLUCOSEU >1000 (A) 12/22/2011 2130   HGBUR NEGATIVE 12/22/2011 2130   BILIRUBINUR NEGATIVE 12/22/2011 2130   KETONESUR  15 (A) 12/22/2011 2130   PROTEINUR NEGATIVE 12/22/2011 2130   UROBILINOGEN 0.2 12/22/2011 2130   NITRITE NEGATIVE 12/22/2011 2130   LEUKOCYTESUR NEGATIVE 12/22/2011 2130   Sepsis Labs Invalid input(s): PROCALCITONIN,  WBC,  LACTICIDVEN Microbiology Recent Results (from the past 240 hour(s))  MRSA PCR Screening     Status: None   Collection Time: 08/27/18  2:43 AM  Result Value Ref Range Status   MRSA by PCR NEGATIVE NEGATIVE Final    Comment:        The GeneXpert MRSA Assay (FDA approved for NASAL specimens only), is one component of a comprehensive MRSA colonization surveillance program. It is not intended to diagnose MRSA infection nor to guide or monitor treatment for MRSA infections. Performed at [facility], [address] [place], [place] [address]      Time coordinating discharge: 67mins  SIGNED:   Kathie Dike, MD  [facility] 08/28/2018, 2:15 PM Pager   If 7PM-7AM, please contact night-coverage [email or web address] Password [email or web address]

## 2018-08-28 NOTE — Care Management Note (Signed)
Case Management Note  Patient Details  Name: Robert [name] MRN: [id number] Date of Birth: [birth year]  Subjective/Objective:    Admitted with CHF. From home with wife, ind with ALD's. Has insurance and PCP. No DME or HH pta. Pt has weaned from supplemental oxygen. Referred to CM for [facility]. Pt has chosen [facility] from list of [facility] providers. He is aware HH has 48 hrs to make first visit.                Action/Plan: DC home today with HH. Referral given to [facility] rep.  Expected Discharge Date:  08/28/18               Expected Discharge Plan:  [place]  In-House Referral:  NA  Discharge planning Services  CM Consult  Post Acute Care Choice:  Home Health Choice offered to:  Patient  HH Arranged:  RN [facility] Agency:  [place]  Status of Service:  Completed, signed off  Sherald Barge, RN 08/28/2018, 2:44 PM

## 2018-08-30 DIAGNOSIS — I10 Essential (primary) hypertension: Secondary | ICD-10-CM | POA: Diagnosis not present

## 2018-08-30 DIAGNOSIS — E669 Obesity, unspecified: Secondary | ICD-10-CM | POA: Diagnosis not present

## 2018-08-30 DIAGNOSIS — I110 Hypertensive heart disease with heart failure: Secondary | ICD-10-CM | POA: Diagnosis not present

## 2018-08-30 DIAGNOSIS — E119 Type 2 diabetes mellitus without complications: Secondary | ICD-10-CM | POA: Diagnosis not present

## 2018-08-30 DIAGNOSIS — I252 Old myocardial infarction: Secondary | ICD-10-CM | POA: Diagnosis not present

## 2018-08-30 DIAGNOSIS — F17210 Nicotine dependence, cigarettes, uncomplicated: Secondary | ICD-10-CM | POA: Diagnosis not present

## 2018-08-30 DIAGNOSIS — F419 Anxiety disorder, unspecified: Secondary | ICD-10-CM | POA: Diagnosis not present

## 2018-08-30 DIAGNOSIS — I2510 Atherosclerotic heart disease of native coronary artery without angina pectoris: Secondary | ICD-10-CM | POA: Diagnosis not present

## 2018-08-30 DIAGNOSIS — Z6832 Body mass index (BMI) 32.0-32.9, adult: Secondary | ICD-10-CM | POA: Diagnosis not present

## 2018-08-30 DIAGNOSIS — E1151 Type 2 diabetes mellitus with diabetic peripheral angiopathy without gangrene: Secondary | ICD-10-CM | POA: Diagnosis not present

## 2018-08-30 DIAGNOSIS — I5042 Chronic combined systolic (congestive) and diastolic (congestive) heart failure: Secondary | ICD-10-CM | POA: Diagnosis not present

## 2018-08-30 DIAGNOSIS — I872 Venous insufficiency (chronic) (peripheral): Secondary | ICD-10-CM | POA: Diagnosis not present

## 2018-08-30 DIAGNOSIS — I509 Heart failure, unspecified: Secondary | ICD-10-CM | POA: Diagnosis not present

## 2018-08-30 DIAGNOSIS — Z23 Encounter for immunization: Secondary | ICD-10-CM | POA: Diagnosis not present

## 2018-08-30 DIAGNOSIS — M069 Rheumatoid arthritis, unspecified: Secondary | ICD-10-CM | POA: Diagnosis not present

## 2018-08-30 DIAGNOSIS — J449 Chronic obstructive pulmonary disease, unspecified: Secondary | ICD-10-CM | POA: Diagnosis not present

## 2018-09-03 DIAGNOSIS — F17210 Nicotine dependence, cigarettes, uncomplicated: Secondary | ICD-10-CM | POA: Diagnosis not present

## 2018-09-03 DIAGNOSIS — I252 Old myocardial infarction: Secondary | ICD-10-CM | POA: Diagnosis not present

## 2018-09-03 DIAGNOSIS — I2510 Atherosclerotic heart disease of native coronary artery without angina pectoris: Secondary | ICD-10-CM | POA: Diagnosis not present

## 2018-09-03 DIAGNOSIS — I5042 Chronic combined systolic (congestive) and diastolic (congestive) heart failure: Secondary | ICD-10-CM | POA: Diagnosis not present

## 2018-09-03 DIAGNOSIS — F419 Anxiety disorder, unspecified: Secondary | ICD-10-CM | POA: Diagnosis not present

## 2018-09-03 DIAGNOSIS — E1151 Type 2 diabetes mellitus with diabetic peripheral angiopathy without gangrene: Secondary | ICD-10-CM | POA: Diagnosis not present

## 2018-09-03 DIAGNOSIS — M069 Rheumatoid arthritis, unspecified: Secondary | ICD-10-CM | POA: Diagnosis not present

## 2018-09-03 DIAGNOSIS — J449 Chronic obstructive pulmonary disease, unspecified: Secondary | ICD-10-CM | POA: Diagnosis not present

## 2018-09-03 DIAGNOSIS — I110 Hypertensive heart disease with heart failure: Secondary | ICD-10-CM | POA: Diagnosis not present

## 2018-09-06 DIAGNOSIS — F17210 Nicotine dependence, cigarettes, uncomplicated: Secondary | ICD-10-CM | POA: Diagnosis not present

## 2018-09-06 DIAGNOSIS — I2510 Atherosclerotic heart disease of native coronary artery without angina pectoris: Secondary | ICD-10-CM | POA: Diagnosis not present

## 2018-09-06 DIAGNOSIS — E1151 Type 2 diabetes mellitus with diabetic peripheral angiopathy without gangrene: Secondary | ICD-10-CM | POA: Diagnosis not present

## 2018-09-06 DIAGNOSIS — I110 Hypertensive heart disease with heart failure: Secondary | ICD-10-CM | POA: Diagnosis not present

## 2018-09-06 DIAGNOSIS — I252 Old myocardial infarction: Secondary | ICD-10-CM | POA: Diagnosis not present

## 2018-09-06 DIAGNOSIS — I5042 Chronic combined systolic (congestive) and diastolic (congestive) heart failure: Secondary | ICD-10-CM | POA: Diagnosis not present

## 2018-09-06 DIAGNOSIS — M069 Rheumatoid arthritis, unspecified: Secondary | ICD-10-CM | POA: Diagnosis not present

## 2018-09-06 DIAGNOSIS — F419 Anxiety disorder, unspecified: Secondary | ICD-10-CM | POA: Diagnosis not present

## 2018-09-06 DIAGNOSIS — J449 Chronic obstructive pulmonary disease, unspecified: Secondary | ICD-10-CM | POA: Diagnosis not present

## 2018-09-10 DIAGNOSIS — I5042 Chronic combined systolic (congestive) and diastolic (congestive) heart failure: Secondary | ICD-10-CM | POA: Diagnosis not present

## 2018-09-10 DIAGNOSIS — E1151 Type 2 diabetes mellitus with diabetic peripheral angiopathy without gangrene: Secondary | ICD-10-CM | POA: Diagnosis not present

## 2018-09-10 DIAGNOSIS — J449 Chronic obstructive pulmonary disease, unspecified: Secondary | ICD-10-CM | POA: Diagnosis not present

## 2018-09-10 DIAGNOSIS — I252 Old myocardial infarction: Secondary | ICD-10-CM | POA: Diagnosis not present

## 2018-09-10 DIAGNOSIS — F17210 Nicotine dependence, cigarettes, uncomplicated: Secondary | ICD-10-CM | POA: Diagnosis not present

## 2018-09-10 DIAGNOSIS — I2510 Atherosclerotic heart disease of native coronary artery without angina pectoris: Secondary | ICD-10-CM | POA: Diagnosis not present

## 2018-09-10 DIAGNOSIS — M069 Rheumatoid arthritis, unspecified: Secondary | ICD-10-CM | POA: Diagnosis not present

## 2018-09-10 DIAGNOSIS — F419 Anxiety disorder, unspecified: Secondary | ICD-10-CM | POA: Diagnosis not present

## 2018-09-10 DIAGNOSIS — I110 Hypertensive heart disease with heart failure: Secondary | ICD-10-CM | POA: Diagnosis not present

## 2018-09-12 ENCOUNTER — Telehealth: Payer: Self-pay | Admitting: *Deleted

## 2018-09-12 ENCOUNTER — Encounter (HOSPITAL_COMMUNITY)
Admission: RE | Admit: 2018-09-12 | Discharge: 2018-09-12 | Disposition: A | Discharge status: Home / Self Care | Payer: Medicare HMO | Source: Ambulatory Visit | Attending: Internal Medicine | Admitting: Internal Medicine

## 2018-09-12 NOTE — Pre-Procedure Instructions (Signed)
Patient in for PAT. According to last admission of 08/28/2018, patient had severe CHF on hospitalization. According to his discharge instructions he was supposed to follow up with his cardiologist 2 weeks after discharge and he has not done this yet. He states," No one told me about that". I reviewed chart with Dr Rick Duff and he concurs that patient should be cleared by cardiologist prior to his procedure, especially due to the amount of fluids that will be ingested for prep. Patient verbalizes understanding of this. I called [facility] and spoke with Mindy and cancelled procedure with OR scheduler.

## 2018-09-12 NOTE — Telephone Encounter (Signed)
Kim called back and is aware

## 2018-09-12 NOTE — Telephone Encounter (Signed)
Patient aware.

## 2018-09-12 NOTE — Telephone Encounter (Signed)
Received a call from Endo. Patient is on R[facility] schedule for 09/16/18 and will need to be cancelled. Patient was hospitalized 08/26/18 with CHF and was suppose to f/u with cardiology and did not do so. Patient advised them he hasn't scheduled an appt with cards yet. Patient will need cardiac clearance before TCS/EGD can be performed. Called Kim in endo and LMTCB to cancel. FYI to LSL

## 2018-09-12 NOTE — Telephone Encounter (Signed)
Noted. Make sure patient lets [place] know once he has been cleared by cardiology and then we can discuss reschedule.

## 2018-09-13 DIAGNOSIS — F419 Anxiety disorder, unspecified: Secondary | ICD-10-CM | POA: Diagnosis not present

## 2018-09-13 DIAGNOSIS — I110 Hypertensive heart disease with heart failure: Secondary | ICD-10-CM | POA: Diagnosis not present

## 2018-09-13 DIAGNOSIS — F17210 Nicotine dependence, cigarettes, uncomplicated: Secondary | ICD-10-CM | POA: Diagnosis not present

## 2018-09-13 DIAGNOSIS — J449 Chronic obstructive pulmonary disease, unspecified: Secondary | ICD-10-CM | POA: Diagnosis not present

## 2018-09-13 DIAGNOSIS — I252 Old myocardial infarction: Secondary | ICD-10-CM | POA: Diagnosis not present

## 2018-09-13 DIAGNOSIS — I5042 Chronic combined systolic (congestive) and diastolic (congestive) heart failure: Secondary | ICD-10-CM | POA: Diagnosis not present

## 2018-09-13 DIAGNOSIS — E1151 Type 2 diabetes mellitus with diabetic peripheral angiopathy without gangrene: Secondary | ICD-10-CM | POA: Diagnosis not present

## 2018-09-13 DIAGNOSIS — M069 Rheumatoid arthritis, unspecified: Secondary | ICD-10-CM | POA: Diagnosis not present

## 2018-09-13 DIAGNOSIS — I2510 Atherosclerotic heart disease of native coronary artery without angina pectoris: Secondary | ICD-10-CM | POA: Diagnosis not present

## 2018-09-16 ENCOUNTER — Ambulatory Visit (HOSPITAL_COMMUNITY): Admission: RE | Admit: 2018-09-16 | Payer: Medicare HMO | Source: Home / Self Care | Admitting: Internal Medicine

## 2018-09-16 ENCOUNTER — Encounter (HOSPITAL_COMMUNITY): Admission: RE | Payer: Self-pay | Source: Home / Self Care

## 2018-09-16 SURGERY — COLONOSCOPY WITH PROPOFOL
Anesthesia: Monitor Anesthesia Care

## 2018-09-16 NOTE — Telephone Encounter (Signed)
Noted fyi to [place]

## 2018-09-16 NOTE — Telephone Encounter (Signed)
Has appointment January 14 MCr

## 2018-09-17 DIAGNOSIS — F419 Anxiety disorder, unspecified: Secondary | ICD-10-CM | POA: Diagnosis not present

## 2018-09-17 DIAGNOSIS — J449 Chronic obstructive pulmonary disease, unspecified: Secondary | ICD-10-CM | POA: Diagnosis not present

## 2018-09-17 DIAGNOSIS — I2510 Atherosclerotic heart disease of native coronary artery without angina pectoris: Secondary | ICD-10-CM | POA: Diagnosis not present

## 2018-09-17 DIAGNOSIS — I252 Old myocardial infarction: Secondary | ICD-10-CM | POA: Diagnosis not present

## 2018-09-17 DIAGNOSIS — E1151 Type 2 diabetes mellitus with diabetic peripheral angiopathy without gangrene: Secondary | ICD-10-CM | POA: Diagnosis not present

## 2018-09-17 DIAGNOSIS — M069 Rheumatoid arthritis, unspecified: Secondary | ICD-10-CM | POA: Diagnosis not present

## 2018-09-17 DIAGNOSIS — I110 Hypertensive heart disease with heart failure: Secondary | ICD-10-CM | POA: Diagnosis not present

## 2018-09-17 DIAGNOSIS — F17210 Nicotine dependence, cigarettes, uncomplicated: Secondary | ICD-10-CM | POA: Diagnosis not present

## 2018-09-17 DIAGNOSIS — I5042 Chronic combined systolic (congestive) and diastolic (congestive) heart failure: Secondary | ICD-10-CM | POA: Diagnosis not present

## 2018-09-24 ENCOUNTER — Ambulatory Visit: Payer: Medicare HMO | Admitting: Adult Health

## 2018-09-24 ENCOUNTER — Encounter: Payer: Self-pay | Admitting: Adult Health

## 2018-09-24 VITALS — BP 128/76 | HR 74 | Ht 72.0 in | Wt 240.8 lb

## 2018-09-24 DIAGNOSIS — Z01811 Encounter for preprocedural respiratory examination: Secondary | ICD-10-CM | POA: Diagnosis not present

## 2018-09-24 DIAGNOSIS — Z01810 Encounter for preprocedural cardiovascular examination: Secondary | ICD-10-CM

## 2018-09-24 DIAGNOSIS — I5032 Chronic diastolic (congestive) heart failure: Secondary | ICD-10-CM

## 2018-09-24 DIAGNOSIS — I2510 Atherosclerotic heart disease of native coronary artery without angina pectoris: Secondary | ICD-10-CM | POA: Diagnosis not present

## 2018-09-24 DIAGNOSIS — Z720 Tobacco use: Secondary | ICD-10-CM | POA: Diagnosis not present

## 2018-09-24 DIAGNOSIS — Z9861 Coronary angioplasty status: Secondary | ICD-10-CM | POA: Diagnosis not present

## 2018-09-24 DIAGNOSIS — J449 Chronic obstructive pulmonary disease, unspecified: Secondary | ICD-10-CM | POA: Diagnosis not present

## 2018-09-24 NOTE — Progress Notes (Signed)
Cardiology Office Note   Date:  09/24/2018   ID:  Robert [name], DOB [birth year], MRN [id number]  PCP:  Redmond School, MD  Cardiologist:  Dr.Croitoru  Chief Complaint  Patient presents with  . Follow-up    CARDIAC CLEARANCE FOR COLONSCOPY     History of Present Illness: Robert [name] is a 68 y.o. male who presents for ongoing assessment and management of CAD, with frequent instent stenosis, multiple interventions in the left Cx, presented with late instent stenosis in 05/2017 with NSTEMI and treated with plain angioplasty with placement of new stent. He is also known to have chronic total occlusion of the right coronary artery.  He has a 60% stenosis in the proximal LAD and a 60% stenosis in the ostial first diagonal artery  Other history includes COPD, Chronic venous insufficiency, HL, GERD, Tobacco abuse.   (copied for accuracy) Bypass surgery was considered during this most recent hospitalization.  It was felt that he would likely not benefit of this procedure since the distal right coronary artery was a poor target, the left circumflex coronary artery territory is mostly infarcted and the LAD stenosis is not critical.  In addition he developed severe thrombocytopenia during that hospital stay (platelets down to 38,000), possibly due to the use of Aggrastat.  By the time of hospital discharge his platelet count had recovered slightly to 58,000.  An additional disincentive to bypass surgery the fact that he has undergone left leg vein stripping, although he still appears to have adequate veins for bypass in his right lower extremity. Left ventricular ejection fraction has dropped as a result of his most recent infarction, now down to 40-45%.  At the time of left heart catheterization LVEDP was mildly increased to 19 mmHg. Additional medical problems include PAD, insulin requiring diabetes mellitus, dyslipidemia with very low HDL and rheumatoid arthritis.  He has been managed for years with  methotrexate, which may also be partly responsible for tendency to pancytopenia), but now is on Actemra monotherapy with excellent clinical response.  He was recently discharged on 08/28/2018 after decompensated CHF which initlally required BiPAP. He was discharged on lasix 80 mg daily which he divides into two doses of 40 mg as he diureses too much with the higher dose all at once. He is being followed by [facility] who continues to monitor is weight and his fluids. He has chronic venous statis erythema on the right with some open healing sores.  [facility] is treating those as well.     He is here for cardiac clearance for TCS/EDG planned for unknown date as he was scheduled before his recent admission. He is to have the procedure done by Dr. Sydell Axon at [facility], in [place].  .  Past Medical History:  Diagnosis Date  . Anxiety   . COPD (chronic obstructive pulmonary disease) ([place])   . Coronary artery disease   . Full dentures   . GERD (gastroesophageal reflux disease)    Rolaids as needed  . High cholesterol   . History of MI (myocardial infarction)   . Hypertension    med. dosage increased 04/2016; has been on BP med. x 10 yrs.  . Incarcerated umbilical hernia 67/6720  . Inguinal hernia 05/2016   bilateral   . Insulin dependent diabetes mellitus ([place])   . Ischemic cardiomyopathy    a. 03/2015 EF 40-45% by LV gram.  . Left leg cellulitis 10/08/2016  . Rheumatoid arthritis(714.0)     Past Surgical History:  Procedure Laterality Date  .  CARDIAC CATHETERIZATION N/A 02/01/2015   Procedure: Left Heart Cath and Coronary Angiography;  Surgeon: Belva Crome, MD;  Location: [place] CV [facility];  Service: Cardiovascular;  Laterality: N/A;  . CARDIAC CATHETERIZATION N/A 03/22/2015   Procedure: Left Heart Cath and Coronary Angiography;  Surgeon: Leonie Man, MD;  Location: [place] [facility];  Service: Cardiovascular;  Laterality: N/A;  . CARDIAC CATHETERIZATION  09/05/2002; 03/09/2003; 04/10/2005;06/02/2005;  05/09/2006; 02/09/2007; 03/01/2007  . COLONOSCOPY  2008   Dr. Gala Romney: diverticulosis, hyperplastic polyp.  . CORONARY ANGIOPLASTY  11/14/2012; 02/01/2015  . CORONARY ANGIOPLASTY WITH STENT PLACEMENT  05/16/2012   "1; makes total ~ 4"  . CORONARY STENT INTERVENTION N/A 06/06/2017   Procedure: CORONARY STENT INTERVENTION;  Surgeon: Belva Crome, MD;  Location: [place] [facility];  Service: Cardiovascular;  Laterality: N/A;  . INSERTION OF MESH Bilateral 05/24/2016   Procedure: INSERTION OF MESH;  Surgeon: Coralie Keens, MD;  Location: [place];  Service: General;  Laterality: Bilateral;  . LAPAROSCOPIC CHOLECYSTECTOMY    . LAPAROSCOPIC INGUINAL HERNIA WITH UMBILICAL HERNIA Bilateral 05/24/2016   Procedure: BILATERAL LAPAROSCOPIC INGUINAL HERNIA REPAIR WITH MESH AND UMBILICAL HERNIA REPAIR WITH MESH;  Surgeon: Coralie Keens, MD;  Location: [place];  Service: General;  Laterality: Bilateral;  . LEFT HEART CATH AND CORONARY ANGIOGRAPHY N/A 06/06/2017   Procedure: LEFT HEART CATH AND CORONARY ANGIOGRAPHY;  Surgeon: Belva Crome, MD;  Location: [place] [facility];  Service: Cardiovascular;  Laterality: N/A;  . LEFT HEART CATHETERIZATION WITH CORONARY ANGIOGRAM N/A 12/22/2011   Procedure: LEFT HEART CATHETERIZATION WITH CORONARY ANGIOGRAM;  Surgeon: Troy Sine, MD;  Location: [facility] CATH LAB;  Service: Cardiovascular;  Laterality: N/A;  . LEFT HEART CATHETERIZATION WITH CORONARY ANGIOGRAM Bilateral 05/16/2012   Procedure: LEFT HEART CATHETERIZATION WITH CORONARY ANGIOGRAM;  Surgeon: Lorretta Harp, MD;  Location: [facility] CATH LAB;  Service: Cardiovascular;  Laterality: Bilateral;  . LEFT HEART CATHETERIZATION WITH CORONARY ANGIOGRAM N/A 11/14/2012   Procedure: LEFT HEART CATHETERIZATION WITH CORONARY ANGIOGRAM;  Surgeon: Troy Sine, MD;  Location: [facility] CATH LAB;  Service: Cardiovascular;  Laterality: N/A;  . LEFT HEART CATHETERIZATION WITH CORONARY ANGIOGRAM N/A 09/01/2013    Procedure: LEFT HEART CATHETERIZATION WITH CORONARY ANGIOGRAM;  Surgeon: Leonie Man, MD;  Location: [facility] CATH LAB;  Service: Cardiovascular;  Laterality: N/A;  . [place]; 1973; 1985   x 3  . NM MYOCAR PERF WALL MOTION  08/30/2009   No significant ischemia  . OPEN REDUCTION INTERNAL FIXATION (ORIF) DISTAL RADIAL FRACTURE Right 12/10/2016   Procedure: OPEN REDUCTION INTERNAL FIXATION (ORIF) DISTAL RADIAL FRACTURE;  Surgeon: Iran Planas, MD;  Location: [place];  Service: Orthopedics;  Laterality: Right;  . PERCUTANEOUS CORONARY INTERVENTION-BALLOON ONLY  12/22/2011   Procedure: PERCUTANEOUS CORONARY INTERVENTION-BALLOON ONLY;  Surgeon: Troy Sine, MD;  Location: [facility] C[facility] LAB;  Service: Cardiovascular;;  . PERCUTANEOUS CORONARY INTERVENTION-BALLOON ONLY  11/14/2012   Procedure: PERCUTANEOUS CORONARY INTERVENTION-BALLOON ONLY;  Surgeon: Troy Sine, MD;  Location: [facility] CATH LAB;  Service: Cardiovascular;;  . PERCUTANEOUS CORONARY STENT INTERVENTION (PCI-S)  05/16/2012   Procedure: PERCUTANEOUS CORONARY STENT INTERVENTION (PCI-S);  Surgeon: Lorretta Harp, MD;  Location: [facility] CATH LAB;  Service: Cardiovascular;;  . PERCUTANEOUS CORONARY STENT INTERVENTION (PCI-S)  09/01/2013   Procedure: PERCUTANEOUS CORONARY STENT INTERVENTION (PCI-S);  Surgeon: Leonie Man, MD;  Location: [facility] CATH LAB;  Service: Cardiovascular;;     Current Outpatient Medications  Medication Sig Dispense Refill  .  acarbose (PRECOSE) 50 MG tablet Take 50 mg by mouth 3 (three) times daily with meals.     [name] acetaminophen (TYLENOL) 650 MG CR tablet Take 650 mg by mouth every 8 (eight) hours as needed for pain.    [name] ALPRAZolam (XANAX) 0.5 MG tablet Take 0.5 mg by mouth 3 (three) times daily as needed for anxiety.     . AMBULATORY NON FORMULARY MEDICATION Take 600 mg by mouth daily at 12 noon. Medication Name: Dalcetrapib vs placebo (Patient taking differently: Take 600 mg by mouth every evening. Medication Name:  Dalcetrapib vs placebo: Cholesterol)    . aspirin EC 81 MG EC tablet Take 1 tablet (81 mg total) by mouth daily.    [name] atorvastatin (LIPITOR) 80 MG tablet TAKE 1 TABLET DAILY AT 6 PM. (Patient taking differently: Take 80 mg by mouth daily at 6 PM. ) 90 tablet 3  . clopidogrel (PLAVIX) 75 MG tablet Take 1 tablet (75 mg total) by mouth daily. 90 tablet 3  . fish oil-omega-3 fatty acids 1000 MG capsule Take 1 g by mouth daily.     . folic acid (FOLVITE) 1 MG tablet Take 1 mg by mouth daily.     . furosemide (LASIX) 40 MG tablet Take 80mg  po bid for 5 days then 80mg  po daily 180 tablet 3  . gabapentin (NEURONTIN) 300 MG capsule Take 300 mg by mouth 3 (three) times daily as needed (for pain).     [name] glipiZIDE (GLUCOTROL) 10 MG tablet Take 10 mg by mouth 2 (two) times daily.    . Insulin Glargine (TOUJEO SOLOSTAR [place]) Inject 80 Units into the skin daily.    . isosorbide mononitrate (IMDUR) 120 MG 24 hr tablet TAKE 1 TABLET EVERY DAY 90 tablet 3  . losartan (COZAAR) 25 MG tablet Take 0.5 tablets (12.5 mg total) by mouth daily. 30 tablet 6  . metFORMIN (GLUCOPHAGE) 1000 MG tablet Take 1 tablet (1,000 mg total) by mouth 2 (two) times daily with a meal. (Patient taking differently: Take 1,000-1,250 mg by mouth See admin instructions. Take 1250 mg by mouth in the morning and 1000 mg in the evening)    . methotrexate 50 MG/2ML injection Inject 2 mLs into the skin once a week. Takes every Thursday    . metoprolol tartrate (LOPRESSOR) 25 MG tablet TAKE 1 TABLET TWICE DAILY 180 tablet 3  . nitroGLYCERIN (NITROSTAT) 0.4 MG SL tablet Place 1 tablet (0.4 mg total) under the tongue every 5 (five) minutes x 3 doses as needed for chest pain. 25 tablet 2  . potassium chloride SA (K-DUR,KLOR-CON) 20 MEQ tablet Take 2 tablets on day one and then 1 tablet by mouth daily thereafter. (Patient taking differently: Take 20-40 mEq by mouth daily as needed. Take 2 tablets on day one and then 1 tablet by mouth daily thereafter. Doesn't  take everyday) 30 tablet 5  . primidone (MYSOLINE) 50 MG tablet Take 100 mg by mouth daily.     . Tocilizumab 162 MG/0.9ML SOSY Inject 162 mg into the skin every Monday. ACTEMRA     No current facility-administered medications for this visit.     Allergies:   Ivp dye [iodinated diagnostic agents]    Social History:  The patient  reports that he has been smoking cigarettes. He has been smoking about 1.00 pack per day. He has never used smokeless tobacco. He reports that he does not drink alcohol or use drugs.   Family History:  The patient's family history includes  Colon cancer in his brother and mother; Heart disease in his father and mother; Hypertension in his mother; Lung cancer in his mother.    ROS: All other systems are reviewed and negative. Unless otherwise mentioned in H&P    PHYSICAL EXAM: VS:  BP 128/76 (BP Location: Right Arm, Patient Position: Sitting)   Pulse 74   Ht 6' (1.829 m)   Wt 240 lb 12.8 oz (109.2 kg)   BMI 32.66 kg/m  , BMI Body mass index is 32.66 kg/m. GEN: Well nourished, well developed, in no acute distress HEENT: normal Neck: no JVD, carotid bruits, or masses Cardiac: IRRR; no murmurs, rubs, or gallops,no edema  Respiratory:  Some bibasilar crackles, no wheezes,, normal work of breathing GI: soft, nontender, nondistended, + BS MS: Erythema with venous statis skin changes and healing lesions with scabbing. Doppler pulses are auscultated.  Skin: warm and dry, no rash Neuro:  Strength and sensation are intact Psych: euthymic mood, full affect   EKG:  Sinus rhythm with PAC;s. Rate of 78 bpm.   Recent Labs: 09/25/2017: NT-Pro BNP 396 08/26/2018: B Natriuretic Peptide 247.0 08/27/2018: ALT 21; Hemoglobin 17.1; Platelets 118 09/01/18: BUN 14; Creatinine, Ser 0.81; Potassium 3.3; Sodium 137    Lipid Panel    Component Value Date/Time   CHOL 85 06/07/2017 0002   TRIG 142 06/07/2017 0002   HDL 29 (L) 06/07/2017 0002   CHOLHDL 2.9 06/07/2017  0002   VLDL 28 06/07/2017 0002   LDLCALC 28 06/07/2017 0002      Wt Readings from Last 3 Encounters:  09/24/18 240 lb 12.8 oz (109.2 kg)  09/01/18 230 lb 9.6 oz (104.6 kg)  07/31/18 239 lb 3.2 oz (108.5 kg)      Other studies Reviewed: Echocardiogram 09-01-18 Left ventricle: The cavity size was normal. Wall thickness was   increased in a pattern of moderate LVH. Systolic function was   normal. The estimated ejection fraction was in the range of 50%   to 55%. Features are consistent with a pseudonormal left   ventricular filling pattern, with concomitant abnormal relaxation   and increased filling pressure (grade 2 diastolic dysfunction). - Aortic valve: Valve area (VTI): 2.23 cm^2. Valve area (Vmax):   2.36 cm^2. - Left atrium: The atrium was moderately dilated.  ASSESSMENT AND PLAN:  1.  Pre-Operative Cardiac Evaluation:  Chart reviewed as part of pre-operative protocol coverage. Given past medical history and time since last visit, based on ACC/AHA guidelines, Robert [name] would be at acceptable risk for the planned procedure without further cardiovascular testing. He will need to hold Plavix for 5 days prior to procedure and restart the following day.   2. CAD: Hx of frequent instent stenosis, chronic total occlusion of the right coronary artery. He has a 60% stenosis in the proximal LAD and a 60% stenosis in the ostial first diagonal artery. He remains on DAPT with Plavix and ASA. Hold Plavix for procedure as above.   3.  Chronic Diastolic CHF:  He was recently admitted for decompensation. He remains on lasix 80 mg daily, but takes it in two separate doses. He is remain on potassium supplement as well. He denies fluid overload. He appears compensated at this time.   4. COPD: Followed by PCP.   5. Tobacco abuse: Recommended Cessation. No plans to quit.     Current medicines are reviewed at length with the patient today.    Labs/ tests ordered today include:  None  Phill Myron. [place] Pugh, ANP,  AACC   09/24/2018 1:59 PM    [facility] [address] Office [phone] Fax [phone]

## 2018-09-24 NOTE — Patient Instructions (Signed)
CLEARED FOR COLONSCOPY Follow-Up: You will need a follow up appointment in [facility].  Please call our office 2 months in advance to schedule this appointment.  You may see Sanda Klein, MD or one of the following Advanced Practice Providers on your designated Care Team:  Almyra Deforest, PA-C  Fabian Sharp, [place]  Medication Instructions:  NO CHANGES- Your physician recommends that you continue on your current medications as directed. Please refer to the Current Medication list given to you today. If you need a refill on your cardiac medications before your next appointment, please call your pharmacy. Labwork: When you have labs (blood work) and your tests are completely normal, you will receive your results ONLY by [place] (if you have MyChart) -OR- A paper copy in the mail. At [facility], you and your health needs are our priority.  As part of our continuing mission to provide you with exceptional heart care, we have created designated Provider Care Teams.  These Care Teams include your primary Cardiologist (physician) and Advanced Practice Providers (APPs -  Physician Assistants and Nurse Practitioners) who all work together to provide you with the care you need, when you need it.  Thank you for choosing [facility] at [facility]!!

## 2018-09-24 NOTE — Progress Notes (Signed)
Agree, thank you MCr 

## 2018-09-26 DIAGNOSIS — I2510 Atherosclerotic heart disease of native coronary artery without angina pectoris: Secondary | ICD-10-CM | POA: Diagnosis not present

## 2018-09-26 DIAGNOSIS — I252 Old myocardial infarction: Secondary | ICD-10-CM | POA: Diagnosis not present

## 2018-09-26 DIAGNOSIS — J449 Chronic obstructive pulmonary disease, unspecified: Secondary | ICD-10-CM | POA: Diagnosis not present

## 2018-09-26 DIAGNOSIS — E1151 Type 2 diabetes mellitus with diabetic peripheral angiopathy without gangrene: Secondary | ICD-10-CM | POA: Diagnosis not present

## 2018-09-26 DIAGNOSIS — F419 Anxiety disorder, unspecified: Secondary | ICD-10-CM | POA: Diagnosis not present

## 2018-09-26 DIAGNOSIS — M069 Rheumatoid arthritis, unspecified: Secondary | ICD-10-CM | POA: Diagnosis not present

## 2018-09-26 DIAGNOSIS — F17210 Nicotine dependence, cigarettes, uncomplicated: Secondary | ICD-10-CM | POA: Diagnosis not present

## 2018-09-26 DIAGNOSIS — I5042 Chronic combined systolic (congestive) and diastolic (congestive) heart failure: Secondary | ICD-10-CM | POA: Diagnosis not present

## 2018-09-26 DIAGNOSIS — I110 Hypertensive heart disease with heart failure: Secondary | ICD-10-CM | POA: Diagnosis not present

## 2018-09-27 DIAGNOSIS — J449 Chronic obstructive pulmonary disease, unspecified: Secondary | ICD-10-CM | POA: Diagnosis not present

## 2018-09-27 DIAGNOSIS — M069 Rheumatoid arthritis, unspecified: Secondary | ICD-10-CM | POA: Diagnosis not present

## 2018-09-27 DIAGNOSIS — I5042 Chronic combined systolic (congestive) and diastolic (congestive) heart failure: Secondary | ICD-10-CM | POA: Diagnosis not present

## 2018-09-27 DIAGNOSIS — I110 Hypertensive heart disease with heart failure: Secondary | ICD-10-CM | POA: Diagnosis not present

## 2018-10-01 DIAGNOSIS — F419 Anxiety disorder, unspecified: Secondary | ICD-10-CM | POA: Diagnosis not present

## 2018-10-01 DIAGNOSIS — I2510 Atherosclerotic heart disease of native coronary artery without angina pectoris: Secondary | ICD-10-CM | POA: Diagnosis not present

## 2018-10-01 DIAGNOSIS — I110 Hypertensive heart disease with heart failure: Secondary | ICD-10-CM | POA: Diagnosis not present

## 2018-10-01 DIAGNOSIS — I252 Old myocardial infarction: Secondary | ICD-10-CM | POA: Diagnosis not present

## 2018-10-01 DIAGNOSIS — F17210 Nicotine dependence, cigarettes, uncomplicated: Secondary | ICD-10-CM | POA: Diagnosis not present

## 2018-10-01 DIAGNOSIS — I5042 Chronic combined systolic (congestive) and diastolic (congestive) heart failure: Secondary | ICD-10-CM | POA: Diagnosis not present

## 2018-10-01 DIAGNOSIS — E1151 Type 2 diabetes mellitus with diabetic peripheral angiopathy without gangrene: Secondary | ICD-10-CM | POA: Diagnosis not present

## 2018-10-01 DIAGNOSIS — M069 Rheumatoid arthritis, unspecified: Secondary | ICD-10-CM | POA: Diagnosis not present

## 2018-10-01 DIAGNOSIS — J449 Chronic obstructive pulmonary disease, unspecified: Secondary | ICD-10-CM | POA: Diagnosis not present

## 2018-10-11 DIAGNOSIS — F419 Anxiety disorder, unspecified: Secondary | ICD-10-CM | POA: Diagnosis not present

## 2018-10-11 DIAGNOSIS — I110 Hypertensive heart disease with heart failure: Secondary | ICD-10-CM | POA: Diagnosis not present

## 2018-10-11 DIAGNOSIS — I252 Old myocardial infarction: Secondary | ICD-10-CM | POA: Diagnosis not present

## 2018-10-11 DIAGNOSIS — F17210 Nicotine dependence, cigarettes, uncomplicated: Secondary | ICD-10-CM | POA: Diagnosis not present

## 2018-10-11 DIAGNOSIS — E1151 Type 2 diabetes mellitus with diabetic peripheral angiopathy without gangrene: Secondary | ICD-10-CM | POA: Diagnosis not present

## 2018-10-11 DIAGNOSIS — M069 Rheumatoid arthritis, unspecified: Secondary | ICD-10-CM | POA: Diagnosis not present

## 2018-10-11 DIAGNOSIS — I2510 Atherosclerotic heart disease of native coronary artery without angina pectoris: Secondary | ICD-10-CM | POA: Diagnosis not present

## 2018-10-11 DIAGNOSIS — J449 Chronic obstructive pulmonary disease, unspecified: Secondary | ICD-10-CM | POA: Diagnosis not present

## 2018-10-11 DIAGNOSIS — I5042 Chronic combined systolic (congestive) and diastolic (congestive) heart failure: Secondary | ICD-10-CM | POA: Diagnosis not present

## 2018-10-22 ENCOUNTER — Encounter: Payer: Medicare HMO | Admitting: *Deleted

## 2018-10-22 VITALS — BP 132/66 | HR 81 | Wt 238.6 lb

## 2018-10-22 DIAGNOSIS — Z006 Encounter for examination for normal comparison and control in clinical research program: Secondary | ICD-10-CM

## 2018-10-22 NOTE — Research (Signed)
[place] study patient doing well. He had 1 hospitalization at [facility] for CHF back in December Drug dispensed. Patient updated on the study, questions encouraged and answered. Patient very will to continue the follow up as requested. Will call patient and schedule next follow up visit.

## 2018-10-30 ENCOUNTER — Other Ambulatory Visit: Payer: Self-pay | Admitting: Cardiovascular Disease

## 2018-11-18 DIAGNOSIS — I5043 Acute on chronic combined systolic (congestive) and diastolic (congestive) heart failure: Secondary | ICD-10-CM | POA: Diagnosis not present

## 2018-11-18 DIAGNOSIS — E1140 Type 2 diabetes mellitus with diabetic neuropathy, unspecified: Secondary | ICD-10-CM | POA: Diagnosis not present

## 2018-11-18 DIAGNOSIS — Z6832 Body mass index (BMI) 32.0-32.9, adult: Secondary | ICD-10-CM | POA: Diagnosis not present

## 2018-11-18 DIAGNOSIS — Z79899 Other long term (current) drug therapy: Secondary | ICD-10-CM | POA: Diagnosis not present

## 2018-11-18 DIAGNOSIS — M25551 Pain in right hip: Secondary | ICD-10-CM | POA: Diagnosis not present

## 2018-11-18 DIAGNOSIS — I10 Essential (primary) hypertension: Secondary | ICD-10-CM | POA: Diagnosis not present

## 2018-11-18 DIAGNOSIS — E1151 Type 2 diabetes mellitus with diabetic peripheral angiopathy without gangrene: Secondary | ICD-10-CM | POA: Diagnosis not present

## 2018-11-18 DIAGNOSIS — Z1389 Encounter for screening for other disorder: Secondary | ICD-10-CM | POA: Diagnosis not present

## 2018-11-18 DIAGNOSIS — M5441 Lumbago with sciatica, right side: Secondary | ICD-10-CM | POA: Diagnosis not present

## 2018-11-18 DIAGNOSIS — M0689 Other specified rheumatoid arthritis, multiple sites: Secondary | ICD-10-CM | POA: Diagnosis not present

## 2018-11-18 DIAGNOSIS — E669 Obesity, unspecified: Secondary | ICD-10-CM | POA: Diagnosis not present

## 2018-11-18 DIAGNOSIS — M2550 Pain in unspecified joint: Secondary | ICD-10-CM | POA: Diagnosis not present

## 2018-11-18 DIAGNOSIS — Z719 Counseling, unspecified: Secondary | ICD-10-CM | POA: Diagnosis not present

## 2018-11-18 DIAGNOSIS — M0579 Rheumatoid arthritis with rheumatoid factor of multiple sites without organ or systems involvement: Secondary | ICD-10-CM | POA: Diagnosis not present

## 2018-11-18 DIAGNOSIS — Z6833 Body mass index (BMI) 33.0-33.9, adult: Secondary | ICD-10-CM | POA: Diagnosis not present

## 2018-12-12 ENCOUNTER — Other Ambulatory Visit: Payer: Self-pay | Admitting: Cardiovascular Disease

## 2018-12-13 ENCOUNTER — Telehealth: Payer: Self-pay

## 2018-12-13 NOTE — Telephone Encounter (Signed)
   Primary Cardiologist:  Sanda Klein, MD   Patient contacted.  History reviewed.  No symptoms to suggest any unstable cardiac conditions.  Based on discussion, with current pandemic situation, we will be postponing this appointment for Robert [name] with a plan for f/u in >12 wks or sooner if feasible/necessary.  If symptoms change, he has been instructed to contact our office.   Routing to C19 CANCEL pool for tracking (P CV DIV CV19 CANCEL - reason for visit "other.") and assigning priority (1 = 4-6 wks, 2 = 6-12 wks, 3 = >12 wks).   Robert [name], [place]  12/13/2018 11:07 AM

## 2018-12-17 ENCOUNTER — Telehealth: Payer: Self-pay | Admitting: *Deleted

## 2018-12-17 ENCOUNTER — Ambulatory Visit: Payer: Medicare HMO | Admitting: Cardiovascular Disease

## 2018-12-17 NOTE — Telephone Encounter (Signed)
Robert [name] research study phone call to inform patient that the study would prefer that we sent out enough study drug to last until the end of the year due to the [id number] and the uncertainties.   Patient was given the option of unscheduled visit since he isn't due for follow up until august. Or I can fed-ex the study drug to him. At this time he would prefer fed-ex. I will let the patient know when I am shipping. Patient is doing well and staying home. He verbalized understanding.

## 2018-12-30 ENCOUNTER — Other Ambulatory Visit: Payer: Self-pay | Admitting: Cardiovascular Disease

## 2018-12-31 ENCOUNTER — Telehealth: Payer: Self-pay | Admitting: *Deleted

## 2018-12-31 NOTE — Telephone Encounter (Signed)
Lopressor 25 mg refilled 

## 2018-12-31 NOTE — Telephone Encounter (Addendum)
[place] study. Called patient and informed that I was shipping IP (study drug) 1 box. I confirmed address and that he would be home. Kit # [id number] dispensed. [facility] tracking # [id number]  Patient verbalized understanding .

## 2019-01-01 ENCOUNTER — Telehealth: Payer: Self-pay | Admitting: *Deleted

## 2019-01-01 NOTE — Telephone Encounter (Signed)
[place] study call to confirm that he received shipped study drug, and he did. I instructed him to continue with current study drug until competed all bottles then start in new kit and write start date on box. Also to hold on to all old bottles until end of study and site will collect. Patient verbalized understanding

## 2019-01-17 ENCOUNTER — Other Ambulatory Visit: Payer: Self-pay | Admitting: Cardiovascular Disease

## 2019-01-17 NOTE — Telephone Encounter (Signed)
Clopidogrel refilled

## 2019-02-06 ENCOUNTER — Other Ambulatory Visit: Payer: Self-pay | Admitting: Cardiovascular Disease

## 2019-02-17 ENCOUNTER — Other Ambulatory Visit: Payer: Self-pay

## 2019-02-17 MED ORDER — FUROSEMIDE 40 MG PO TABS: ORAL_TABLET | ORAL | 3 refills | Status: DC

## 2019-02-17 MED ORDER — POTASSIUM CHLORIDE CRYS ER 20 MEQ PO TBCR: EXTENDED_RELEASE_TABLET | ORAL | 0 refills | Status: DC

## 2019-02-17 MED ORDER — CLOPIDOGREL BISULFATE 75 MG PO TABS: ORAL_TABLET | ORAL | 0 refills | Status: DC

## 2019-02-18 DIAGNOSIS — Z6832 Body mass index (BMI) 32.0-32.9, adult: Secondary | ICD-10-CM | POA: Diagnosis not present

## 2019-02-18 DIAGNOSIS — M2550 Pain in unspecified joint: Secondary | ICD-10-CM | POA: Diagnosis not present

## 2019-02-18 DIAGNOSIS — E669 Obesity, unspecified: Secondary | ICD-10-CM | POA: Diagnosis not present

## 2019-02-18 DIAGNOSIS — M25551 Pain in right hip: Secondary | ICD-10-CM | POA: Diagnosis not present

## 2019-02-18 DIAGNOSIS — M0579 Rheumatoid arthritis with rheumatoid factor of multiple sites without organ or systems involvement: Secondary | ICD-10-CM | POA: Diagnosis not present

## 2019-02-18 DIAGNOSIS — Z79899 Other long term (current) drug therapy: Secondary | ICD-10-CM | POA: Diagnosis not present

## 2019-02-18 DIAGNOSIS — M5441 Lumbago with sciatica, right side: Secondary | ICD-10-CM | POA: Diagnosis not present

## 2019-02-19 DIAGNOSIS — Z6832 Body mass index (BMI) 32.0-32.9, adult: Secondary | ICD-10-CM | POA: Diagnosis not present

## 2019-02-19 DIAGNOSIS — I5042 Chronic combined systolic (congestive) and diastolic (congestive) heart failure: Secondary | ICD-10-CM | POA: Diagnosis not present

## 2019-02-19 DIAGNOSIS — E6609 Other obesity due to excess calories: Secondary | ICD-10-CM | POA: Diagnosis not present

## 2019-02-19 DIAGNOSIS — Z1389 Encounter for screening for other disorder: Secondary | ICD-10-CM | POA: Diagnosis not present

## 2019-02-19 DIAGNOSIS — Z0000 Encounter for general adult medical examination without abnormal findings: Secondary | ICD-10-CM | POA: Diagnosis not present

## 2019-02-19 DIAGNOSIS — I10 Essential (primary) hypertension: Secondary | ICD-10-CM | POA: Diagnosis not present

## 2019-02-19 DIAGNOSIS — Z129 Encounter for screening for malignant neoplasm, site unspecified: Secondary | ICD-10-CM | POA: Diagnosis not present

## 2019-02-19 DIAGNOSIS — E119 Type 2 diabetes mellitus without complications: Secondary | ICD-10-CM | POA: Diagnosis not present

## 2019-02-19 DIAGNOSIS — J449 Chronic obstructive pulmonary disease, unspecified: Secondary | ICD-10-CM | POA: Diagnosis not present

## 2019-02-19 DIAGNOSIS — E782 Mixed hyperlipidemia: Secondary | ICD-10-CM | POA: Diagnosis not present

## 2019-02-19 DIAGNOSIS — F419 Anxiety disorder, unspecified: Secondary | ICD-10-CM | POA: Diagnosis not present

## 2019-02-19 DIAGNOSIS — E1140 Type 2 diabetes mellitus with diabetic neuropathy, unspecified: Secondary | ICD-10-CM | POA: Diagnosis not present

## 2019-02-22 ENCOUNTER — Other Ambulatory Visit: Payer: Self-pay | Admitting: Cardiovascular Disease

## 2019-02-28 ENCOUNTER — Telehealth: Payer: Self-pay | Admitting: *Deleted

## 2019-02-28 NOTE — Telephone Encounter (Signed)
Open in error

## 2019-03-13 ENCOUNTER — Other Ambulatory Visit: Payer: Self-pay

## 2019-03-13 MED ORDER — POTASSIUM CHLORIDE CRYS ER 20 MEQ PO TBCR: EXTENDED_RELEASE_TABLET | ORAL | 1 refills | Status: DC

## 2019-03-24 ENCOUNTER — Telehealth: Payer: Self-pay | Admitting: *Deleted

## 2019-03-24 NOTE — Telephone Encounter (Signed)
Attempted to reach the patient to confirm his appointment tomorrow with Dr. Sallyanne Kuster.  Was unable to leave a message.

## 2019-03-25 ENCOUNTER — Ambulatory Visit (INDEPENDENT_AMBULATORY_CARE_PROVIDER_SITE_OTHER): Payer: Medicare HMO | Admitting: Cardiovascular Disease

## 2019-03-25 ENCOUNTER — Other Ambulatory Visit: Payer: Self-pay

## 2019-03-25 VITALS — BP 132/72 | HR 84 | Temp 98.4°F | Ht 72.0 in | Wt 235.0 lb

## 2019-03-25 DIAGNOSIS — J449 Chronic obstructive pulmonary disease, unspecified: Secondary | ICD-10-CM | POA: Diagnosis not present

## 2019-03-25 DIAGNOSIS — E1151 Type 2 diabetes mellitus with diabetic peripheral angiopathy without gangrene: Secondary | ICD-10-CM

## 2019-03-25 DIAGNOSIS — Z794 Long term (current) use of insulin: Secondary | ICD-10-CM

## 2019-03-25 DIAGNOSIS — I5042 Chronic combined systolic (congestive) and diastolic (congestive) heart failure: Secondary | ICD-10-CM

## 2019-03-25 DIAGNOSIS — I739 Peripheral vascular disease, unspecified: Secondary | ICD-10-CM | POA: Diagnosis not present

## 2019-03-25 DIAGNOSIS — E785 Hyperlipidemia, unspecified: Secondary | ICD-10-CM | POA: Diagnosis not present

## 2019-03-25 DIAGNOSIS — I25118 Atherosclerotic heart disease of native coronary artery with other forms of angina pectoris: Secondary | ICD-10-CM | POA: Diagnosis not present

## 2019-03-25 DIAGNOSIS — M069 Rheumatoid arthritis, unspecified: Secondary | ICD-10-CM | POA: Diagnosis not present

## 2019-03-25 DIAGNOSIS — I509 Heart failure, unspecified: Secondary | ICD-10-CM | POA: Diagnosis not present

## 2019-03-25 DIAGNOSIS — I10 Essential (primary) hypertension: Secondary | ICD-10-CM

## 2019-03-25 MED ORDER — ISOSORBIDE MONONITRATE ER 60 MG PO TB24: 60.0000 mg | ORAL_TABLET | Freq: Every day | ORAL | 1 refills | Status: DC

## 2019-03-25 NOTE — Progress Notes (Signed)
Cardiology Office Note    Date:  03/25/2019   ID:  Robert [name], DOB September 07, 1951, MRN [id number]  PCP:  Robert School, MD  Cardiologist:   Robert Klein, MD   Chief complaint: CAD, CHF   History of Present Illness:  Robert [name] is a 68 y.o. male with premature onset coronary artery disease and frequent problems with in-stent restenosis after multiple interventions in the left circumflex coronary artery, most recently presenting with late stent thrombosis in September 2018 with non-ST segment elevation myocardial infarction, treated with plain angioplasty without placement of new stent.  He is also known to have chronic total occlusion of the right coronary artery.  He has a 60% stenosis in the proximal LAD and a 60% stenosis in the ostial first diagonal artery.  He was briefly hospitalized in December with heart failure exacerbation.  He is generally doing well since then and denies any problems with angina at rest or with activity.  He continues to have NYHA functional class II exertional dyspnea that does not interfere with daily activity and has chronic mild swelling in his left foot.    Rheumatoid arthritis symptoms are well controlled on Actemra and methotrexate.  He is tolerating the clopidogrel without a rash.    Unfortunately, his wife passed away in 2023-02-10.  He is still clearly grieving from that.  She had complications of an abdominal infection and was treated at [facility].  Fortunately this happened during the coronavirus pandemic, which made it hard for him to be by her side during her hospitalization.  She eventually died at home.  After that he started smoking again, but wants to quit.  He is not smoking.  He is in close touch with his children.   Diabetes control has improved and his last hemoglobin A1c was close to target at 7.3% (much better than 9% last September).  His hemoglobin is normal and his creatinine is 0.6. He is on maximum dose  atorvastatin.  His HDL is still low at 23.  He is enrolled in the dalcetrapib DALGENE trial.                      Bypass surgery was considered , but it was felt that he would likely not benefit of this procedure since the distal right coronary artery was a poor target, the left circumflex coronary artery territory is mostly infarcted and the LAD stenosis is not critical.  In addition he developed severe thrombocytopenia during that hospital stay (platelets down to 38,000), possibly due to the use of Aggrastat.  By the time of hospital discharge his platelet count had recovered slightly to 58,000.  An additional disincentive to bypass surgery the fact that he has undergone left leg vein stripping, although he still appears to have adequate veins for bypass in his right lower extremity. Left ventricular ejection fraction has dropped as a result of his most recent infarction, now down to 40-45%.  At the time of left heart catheterization LVEDP was mildly increased to 19 mmHg. Additional medical problems include PAD, insulin requiring diabetes mellitus, dyslipidemia with very low HDL and rheumatoid arthritis.  He has been managed for years with methotrexate, which may also be partly responsible for tendency to pancytopenia), but now is on Actemra monotherapy with excellent clinical response.   summary of coronary interventions: -cath 2004 showed total occlusion of RCA and intermediate lesions LAD, high grade mid LCX treated with 4x18 mm BMS (Velocity).  -  2006 overlapping 3.5x23 mm Cypher inLCX  - 2007 new downstream stenosis at OM bifurcation treated with 3x18 mm Cypher stent  - 2008 acute stent thrombosis (allergic to plavix, temporarily stopped Ticlid) - thrombolysis/aspiration only  - April 2013 cutting balloon angioplasty for proximal LCX in stent restenosis (Dr. Claiborne Billings)  - September 2013 repeat restenosis treated with "stent in stent" Xience Expedition 3.25 x 23 mm (Dr. Gwenlyn Found).  - March 2014 left  circumflex coronary artery PCI for restenosis, utilizing cutting balloon atherotomy/noncompliant balloon dilatation  - December 2014  Thrombotic occlusion of restenotic proximal left circumflex stent , new Promus Premier DES 3.5 mm x 24 mm; overlapping the proximal and distal stents -  May 2016 cutting balloon angioplasty for in-stent restenosis in the proximal left circumflex coronary artery -  July 2016 non STEMI ,  coronary angiogram unchanged,no intervention performed -  September 2018 non-STEMI, acute late thrombosis of the left circumflex stent, treated with angioplasty alone (Dr. Tamala Julian) -  No change in RCA or LAD anatomy during last several years.  -  Allergic to iodinated contrast, but did well with premedication. Allergic to clopidogrel but tolerates Brilinta and Effient well.   Past Medical History:  Diagnosis Date   Anxiety    COPD (chronic obstructive pulmonary disease) (HCC)    Coronary artery disease    Full dentures    GERD (gastroesophageal reflux disease)    Rolaids as needed   High cholesterol    History of MI (myocardial infarction)    Hypertension    med. dosage increased 04/2016; has been on BP med. x 10 yrs.   Incarcerated umbilical hernia [id number]/[id number]   Inguinal hernia 05/2016   bilateral    Insulin dependent diabetes mellitus ([place])    Ischemic cardiomyopathy    a. 03/2015 EF 40-45% by LV gram.   Left leg cellulitis 10/08/2016   Rheumatoid arthritis(714.0)     Past Surgical History:  Procedure Laterality Date   CARDIAC CATHETERIZATION N/A 02/01/2015   Procedure: Left Heart Cath and Coronary Angiography;  Surgeon: Belva Crome, MD;  Location: [place] [facility];  Service: Cardiovascular;  Laterality: N/A;   CARDIAC CATHETERIZATION N/A 03/22/2015   Procedure: Left Heart Cath and Coronary Angiography;  Surgeon: Leonie Man, MD;  Location: [place] [facility];  Service: Cardiovascular;  Laterality: N/A;   CARDIAC CATHETERIZATION  09/05/2002;  03/09/2003; 04/10/2005;06/02/2005; 05/09/2006; 02/09/2007; 03/01/2007   COLONOSCOPY  2008   Dr. Gala [name]: diverticulosis, hyperplastic polyp.   CORONARY ANGIOPLASTY  11/14/2012; 02/01/2015   CORONARY ANGIOPLASTY WITH STENT PLACEMENT  05/16/2012   "1; makes total ~ 4"   CORONARY STENT INTERVENTION N/A 06/06/2017   Procedure: CORONARY STENT INTERVENTION;  Surgeon: Belva Crome, MD;  Location: [place] [facility];  Service: Cardiovascular;  Laterality: N/A;   INSERTION OF MESH Bilateral 05/24/2016   Procedure: INSERTION OF MESH;  Surgeon: Coralie Keens, MD;  Location: [place];  Service: General;  Laterality: Bilateral;   LAPAROSCOPIC CHOLECYSTECTOMY     LAPAROSCOPIC INGUINAL HERNIA WITH UMBILICAL HERNIA Bilateral 05/24/2016   Procedure: BILATERAL LAPAROSCOPIC INGUINAL HERNIA REPAIR WITH MESH AND UMBILICAL HERNIA REPAIR WITH MESH;  Surgeon: Coralie Keens, MD;  Location: [place];  Service: General;  Laterality: Bilateral;   LEFT HEART CATH AND CORONARY ANGIOGRAPHY N/A 06/06/2017   Procedure: LEFT HEART CATH AND CORONARY ANGIOGRAPHY;  Surgeon: Belva Crome, MD;  Location: [place] [facility];  Service: Cardiovascular;  Laterality: N/A;   LEFT HEART CATHETERIZATION WITH CORONARY  ANGIOGRAM N/A 12/22/2011   Procedure: LEFT HEART CATHETERIZATION WITH CORONARY ANGIOGRAM;  Surgeon: Troy Sine, MD;  Location: [facility] CATH LAB;  Service: Cardiovascular;  Laterality: N/A;   LEFT HEART CATHETERIZATION WITH CORONARY ANGIOGRAM Bilateral 05/16/2012   Procedure: LEFT HEART CATHETERIZATION WITH CORONARY ANGIOGRAM;  Surgeon: Lorretta Harp, MD;  Location: [facility] CATH LAB;  Service: Cardiovascular;  Laterality: Bilateral;   LEFT HEART CATHETERIZATION WITH CORONARY ANGIOGRAM N/A 11/14/2012   Procedure: LEFT HEART CATHETERIZATION WITH CORONARY ANGIOGRAM;  Surgeon: Troy Sine, MD;  Location: [facility] CATH LAB;  Service: Cardiovascular;  Laterality: N/A;   LEFT HEART CATHETERIZATION WITH  CORONARY ANGIOGRAM N/A 09/01/2013   Procedure: LEFT HEART CATHETERIZATION WITH CORONARY ANGIOGRAM;  Surgeon: Leonie Man, MD;  Location: [facility] CATH LAB;  Service: Cardiovascular;  Laterality: N/A;   LUMBAR LAMINECTOMY  1972; 1973; 1985   x [place]  08/30/2009   No significant ischemia   OPEN REDUCTION INTERNAL FIXATION (ORIF) DISTAL RADIAL FRACTURE Right 12/10/2016   Procedure: OPEN REDUCTION INTERNAL FIXATION (ORIF) DISTAL RADIAL FRACTURE;  Surgeon: Iran Planas, MD;  Location: [place];  Service: Orthopedics;  Laterality: Right;   PERCUTANEOUS CORONARY INTERVENTION-BALLOON ONLY  12/22/2011   Procedure: PERCUTANEOUS CORONARY INTERVENTION-BALLOON ONLY;  Surgeon: Troy Sine, MD;  Location: [facility] CATH LAB;  Service: Cardiovascular;;   PERCUTANEOUS CORONARY INTERVENTION-BALLOON ONLY  11/14/2012   Procedure: PERCUTANEOUS CORONARY INTERVENTION-BALLOON ONLY;  Surgeon: Troy Sine, MD;  Location: [facility] CATH LAB;  Service: Cardiovascular;;   PERCUTANEOUS CORONARY STENT INTERVENTION (PCI-S)  05/16/2012   Procedure: PERCUTANEOUS CORONARY STENT INTERVENTION (PCI-S);  Surgeon: Lorretta Harp, MD;  Location: [facility] CATH LAB;  Service: Cardiovascular;;   PERCUTANEOUS CORONARY STENT INTERVENTION (PCI-S)  09/01/2013   Procedure: PERCUTANEOUS CORONARY STENT INTERVENTION (PCI-S);  Surgeon: Leonie Man, MD;  Location: [facility] CATH LAB;  Service: Cardiovascular;;    Current Medications: Outpatient Medications Prior to Visit  Medication Sig Dispense Refill   acarbose (PRECOSE) 100 MG tablet Take 100 mg by mouth 3 (three) times daily with meals.      acetaminophen (TYLENOL) 650 MG CR tablet Take 650 mg by mouth every 8 (eight) hours as needed for pain.     ALPRAZolam (XANAX) 0.5 MG tablet Take 0.5 mg by mouth 3 (three) times daily as needed for anxiety.      AMBULATORY NON FORMULARY MEDICATION Take 600 mg by mouth daily at 12 noon. Medication Name: Dalcetrapib vs placebo (Patient taking  differently: Take 600 mg by mouth every evening. Medication Name: Dalcetrapib vs placebo: Cholesterol)     aspirin EC 81 MG EC tablet Take 1 tablet (81 mg total) by mouth daily.     atorvastatin (LIPITOR) 80 MG tablet TAKE 1 TABLET DAILY AT 6 PM. 90 tablet 3   clopidogrel (PLAVIX) 75 MG tablet TAKE (1) TABLET BY MOUTH ONCE DAILY. 90 tablet 0   fish oil-omega-3 fatty acids 1000 MG capsule Take 1 g by mouth daily.      folic acid (FOLVITE) 1 MG tablet Take 1 mg by mouth daily.      furosemide (LASIX) 40 MG tablet Take 80mg  po bid for 5 days then 80mg  po daily 180 tablet 3   gabapentin (NEURONTIN) 300 MG capsule Take 300 mg by mouth 3 (three) times daily as needed (for pain).      glipiZIDE (GLUCOTROL) 10 MG tablet Take 10 mg by mouth 2 (two) times daily.     Insulin Glargine (TOUJEO SOLOSTAR [place])  Inject 80 Units into the skin daily.     isosorbide mononitrate (IMDUR) 120 MG 24 hr tablet TAKE 1 TABLET EVERY DAY. KEEP JULY APPOINTMENT. 90 tablet 0   losartan (COZAAR) 25 MG tablet Take 0.5 tablets (12.5 mg total) by mouth daily. 30 tablet 6   metFORMIN (GLUCOPHAGE) 1000 MG tablet Take 1 tablet (1,000 mg total) by mouth 2 (two) times daily with a meal. (Patient taking differently: Take 1,000-1,250 mg by mouth See admin instructions. Take 1250 mg by mouth in the morning and 1000 mg in the evening)     methotrexate 50 MG/2ML injection Inject 2 mLs into the skin once a week. Takes every Thursday     metoprolol tartrate (LOPRESSOR) 25 MG tablet TAKE 1 TABLET TWICE DAILY. KEEP JULY APPOINTMENT. 180 tablet 0   nitroGLYCERIN (NITROSTAT) 0.4 MG SL tablet Place 1 tablet (0.4 mg total) under the tongue every 5 (five) minutes x 3 doses as needed for chest pain. 25 tablet 2   potassium chloride SA (K-DUR) 20 MEQ tablet 1 TABLET BY MOUTH DAILY. 30 tablet 1   primidone (MYSOLINE) 50 MG tablet Take 100 mg by mouth daily.      Tocilizumab 162 MG/0.9ML SOSY Inject 162 mg into the skin every Monday.  ACTEMRA     No facility-administered medications prior to visit.      Allergies:   Ivp dye [iodinated diagnostic agents]   Social History   Socioeconomic History   Marital status: Married    Spouse name: Not on file   Number of children: Not on file   Years of education: Not on file   Highest education level: Not on file  Occupational History   Occupation: Clinical biochemist  Social Needs   Financial resource strain: Not on file   Food insecurity    Worry: Not on file    Inability: Not on file   Transportation needs    Medical: Not on file    Non-medical: Not on file  Tobacco Use   Smoking status: Current Some Day Smoker    Packs/day: 1.00    Types: Cigarettes    Last attempt to quit: 06/11/2015    Years since quitting: 3.7   Smokeless tobacco: Never Used   Tobacco comment: pt states smoking less than half a pack  Substance and Sexual Activity   Alcohol use: No   Drug use: No   Sexual activity: Yes  Lifestyle   Physical activity    Days per week: Not on file    Minutes per session: Not on file   Stress: Not on file  Relationships   Social connections    Talks on phone: Not on file    Gets together: Not on file    Attends religious service: Not on file    Active member of club or organization: Not on file    Attends meetings of clubs or organizations: Not on file    Relationship status: Not on file  Other Topics Concern   Not on file  Social History Narrative   Not on file     Family History:  The patient's family history includes Colon cancer in his brother and mother; Heart disease in his father and mother; Hypertension in his mother; Lung cancer in his mother.   ROS:   Please see the history of present illness.    ROS All other systems reviewed and are negative.   PHYSICAL EXAM:   VS:  Pulse 84    Temp  98.4 F (36.9 C) (Temporal)    Ht 6' (1.829 m)    Wt 235 lb (106.6 kg)    SpO2 94%    BMI 31.87 kg/m      General: Alert,  oriented x3, no distress, mildly obese Head: no evidence of trauma, PERRL, EOMI, no exophtalmos or lid lag, no myxedema, no xanthelasma; normal ears, nose and oropharynx Neck: normal jugular venous pulsations and no hepatojugular reflux; brisk carotid pulses without delay and no carotid bruits Chest: clear to auscultation, no signs of consolidation by percussion or palpation, normal fremitus, symmetrical and full respiratory excursions Cardiovascular: normal position and quality of the apical impulse, regular rhythm, normal first and second heart sounds, no murmurs, rubs or gallops Abdomen: no tenderness or distention, no masses by palpation, no abnormal pulsatility or arterial bruits, normal bowel sounds, no hepatosplenomegaly Extremities: no clubbing, cyanosis; trivial edema left ankle; scars of venous stripping left calf.  2+ radial, ulnar and brachial pulses bilaterally; 2+ right femoral, posterior tibial and dorsalis pedis pulses; 2+ left femoral, posterior tibial and dorsalis pedis pulses; no subclavian or femoral bruits Neurological: grossly nonfocal Psych: Normal mood and affect   Wt Readings from Last 3 Encounters:  03/25/19 235 lb (106.6 kg)  10/22/18 238 lb 9.6 oz (108.2 kg)  09/24/18 240 lb 12.8 oz (109.2 kg)      Studies/Labs Reviewed:   EKG:  EKG is not ordered today.  Last tracing from September 24, 2018 shows sinus rhythm, old inferoposterior infarction, no acute repolarization abnormalities Recent Labs: 08/26/2018: B Natriuretic Peptide 247.0 08/27/2018: ALT 21; Hemoglobin 17.1; Platelets 118 08/28/2018: BUN 14; Creatinine, Ser 0.81; Potassium 3.3; Sodium 137   Lipid Panel    Component Value Date/Time   CHOL 85 06/07/2017 0002   TRIG 142 06/07/2017 0002   HDL 29 (L) 06/07/2017 0002   CHOLHDL 2.9 06/07/2017 0002   VLDL 28 06/07/2017 0002   LDLCALC 28 06/07/2017 0002   ASSESSMENT:    No diagnosis found.   PLAN:  In order of problems listed above:  1. CAD:  Asymptomatic on current antianginal regimen.  We will try to wean down the isosorbide mononitrate to 60 mg once daily.  He has probably infarcted most of the posterior wall.   On chronic aspirin and clopidogrel due to multiple percutaneous interventions.. 2. CHF: Appears to be clinically euvolemic.  Has chronic mild left ankle swelling but no other signs of fluid excess.  NYHA functional class II dyspnea is primarily related to COPD. 3. PAD: Despite extensive disease he denies intermittent claudication..  He has known chronic occlusion of the left popliteal artery with collateral formation and three-vessel runoff.  Normal right ABI.  Left ABI 0.61 (September 2018).  This was evaluated by one of our vascular specialists and felt to be best suited for medical therapy.  September 2018 bilateral carotid artery duplex ultrasound was normal.. 4. HTN: Very well controlled. 5. COPD: Does not have any wheezing today. 6. HLP: Excellent LDL.  HDL low.  In clinical trial. 7. DM: Complicated by neuropathy and peripheral angiopathy.  On metformin and sulfonylurea.  He is a good candidate for SGLT2 inhibitors. 8. RA: on Actemra. MTX may have been partly responsible for previous problems with thrombocytopenia.  Most recent platelet count 168,000    Medication Adjustments/Labs and Tests Ordered: Current medicines are reviewed at length with the patient today.  Concerns regarding medicines are outlined above.  Medication changes, Labs and Tests ordered today are listed in the Patient Instructions  below. There are no Patient Instructions on file for this visit.   Signed, Robert Klein, MD  03/25/2019 2:26 PM    [address], [place], [place]  [address] Phone: [phone]; Fax: [phone]

## 2019-03-25 NOTE — Patient Instructions (Signed)
Medication Instructions:  DECREASE the Isosorbide (Imdur) to 60 mg once daily  If you need a refill on your cardiac medications before your next appointment, please call your pharmacy.   Lab work: None ordered If you have labs (blood work) drawn today and your tests are completely normal, you will receive your results only by: [name] MyChart Message (if you have MyChart) OR . A paper copy in the mail If you have any lab test that is abnormal or we need to change your treatment, we will call you to review the results.  Testing/Procedures: None ordered  Follow-Up: At [facility], you and your health needs are our priority.  As part of our continuing mission to provide you with exceptional heart care, we have created designated Provider Care Teams.  These Care Teams include your primary Cardiologist (physician) and Advanced Practice Providers (APPs -  Physician Assistants and Nurse Practitioners) who all work together to provide you with the care you need, when you need it. You will need a follow up appointment in 6 months.  Please call our office 2 months in advance to schedule this appointment.  You may see Sanda Klein, MD or one of the following Advanced Practice Providers on your designated Care Team: [place], [place] . Fabian Sharp, PA-C

## 2019-03-27 ENCOUNTER — Encounter: Payer: Self-pay | Admitting: Cardiovascular Disease

## 2019-03-27 DIAGNOSIS — I739 Peripheral vascular disease, unspecified: Secondary | ICD-10-CM | POA: Insufficient documentation

## 2019-04-11 ENCOUNTER — Other Ambulatory Visit: Payer: Medicare HMO

## 2019-04-11 ENCOUNTER — Encounter (HOSPITAL_COMMUNITY): Payer: Self-pay

## 2019-04-11 ENCOUNTER — Inpatient Hospital Stay (HOSPITAL_COMMUNITY)
Admission: EM | Admit: 2019-04-11 | Discharge: 2019-05-22 | DRG: 870 | Disposition: A | Discharge status: Rehabilitation Facility | Payer: Medicare HMO | Attending: Internal Medicine | Admitting: Internal Medicine

## 2019-04-11 ENCOUNTER — Emergency Department (HOSPITAL_COMMUNITY): Payer: Medicare HMO

## 2019-04-11 ENCOUNTER — Other Ambulatory Visit: Payer: Self-pay

## 2019-04-11 DIAGNOSIS — Z978 Presence of other specified devices: Secondary | ICD-10-CM

## 2019-04-11 DIAGNOSIS — I5033 Acute on chronic diastolic (congestive) heart failure: Secondary | ICD-10-CM | POA: Diagnosis not present

## 2019-04-11 DIAGNOSIS — A4102 Sepsis due to Methicillin resistant Staphylococcus aureus: Secondary | ICD-10-CM | POA: Diagnosis not present

## 2019-04-11 DIAGNOSIS — D696 Thrombocytopenia, unspecified: Secondary | ICD-10-CM | POA: Diagnosis present

## 2019-04-11 DIAGNOSIS — R509 Fever, unspecified: Secondary | ICD-10-CM

## 2019-04-11 DIAGNOSIS — R0603 Acute respiratory distress: Secondary | ICD-10-CM | POA: Diagnosis not present

## 2019-04-11 DIAGNOSIS — J1289 Other viral pneumonia: Secondary | ICD-10-CM | POA: Diagnosis present

## 2019-04-11 DIAGNOSIS — E1140 Type 2 diabetes mellitus with diabetic neuropathy, unspecified: Secondary | ICD-10-CM | POA: Diagnosis present

## 2019-04-11 DIAGNOSIS — I480 Paroxysmal atrial fibrillation: Secondary | ICD-10-CM | POA: Diagnosis present

## 2019-04-11 DIAGNOSIS — E878 Other disorders of electrolyte and fluid balance, not elsewhere classified: Secondary | ICD-10-CM | POA: Diagnosis present

## 2019-04-11 DIAGNOSIS — J449 Chronic obstructive pulmonary disease, unspecified: Secondary | ICD-10-CM | POA: Diagnosis present

## 2019-04-11 DIAGNOSIS — Z4682 Encounter for fitting and adjustment of non-vascular catheter: Secondary | ICD-10-CM | POA: Diagnosis not present

## 2019-04-11 DIAGNOSIS — N179 Acute kidney failure, unspecified: Secondary | ICD-10-CM | POA: Diagnosis not present

## 2019-04-11 DIAGNOSIS — L89152 Pressure ulcer of sacral region, stage 2: Secondary | ICD-10-CM | POA: Diagnosis not present

## 2019-04-11 DIAGNOSIS — N183 Chronic kidney disease, stage 3 (moderate): Secondary | ICD-10-CM | POA: Diagnosis not present

## 2019-04-11 DIAGNOSIS — Z20822 Contact with and (suspected) exposure to covid-19: Secondary | ICD-10-CM

## 2019-04-11 DIAGNOSIS — E669 Obesity, unspecified: Secondary | ICD-10-CM | POA: Diagnosis not present

## 2019-04-11 DIAGNOSIS — E785 Hyperlipidemia, unspecified: Secondary | ICD-10-CM | POA: Diagnosis not present

## 2019-04-11 DIAGNOSIS — Y92239 Unspecified place in hospital as the place of occurrence of the external cause: Secondary | ICD-10-CM | POA: Diagnosis not present

## 2019-04-11 DIAGNOSIS — E1165 Type 2 diabetes mellitus with hyperglycemia: Secondary | ICD-10-CM | POA: Diagnosis not present

## 2019-04-11 DIAGNOSIS — I252 Old myocardial infarction: Secondary | ICD-10-CM | POA: Diagnosis not present

## 2019-04-11 DIAGNOSIS — L89323 Pressure ulcer of left buttock, stage 3: Secondary | ICD-10-CM | POA: Diagnosis not present

## 2019-04-11 DIAGNOSIS — I2510 Atherosclerotic heart disease of native coronary artery without angina pectoris: Secondary | ICD-10-CM | POA: Diagnosis present

## 2019-04-11 DIAGNOSIS — L8990 Pressure ulcer of unspecified site, unspecified stage: Secondary | ICD-10-CM | POA: Insufficient documentation

## 2019-04-11 DIAGNOSIS — R339 Retention of urine, unspecified: Secondary | ICD-10-CM | POA: Diagnosis not present

## 2019-04-11 DIAGNOSIS — M05712 Rheumatoid arthritis with rheumatoid factor of left shoulder without organ or systems involvement: Secondary | ICD-10-CM | POA: Diagnosis not present

## 2019-04-11 DIAGNOSIS — R5081 Fever presenting with conditions classified elsewhere: Secondary | ICD-10-CM | POA: Diagnosis not present

## 2019-04-11 DIAGNOSIS — T17990A Other foreign object in respiratory tract, part unspecified in causing asphyxiation, initial encounter: Secondary | ICD-10-CM | POA: Diagnosis not present

## 2019-04-11 DIAGNOSIS — Z8249 Family history of ischemic heart disease and other diseases of the circulatory system: Secondary | ICD-10-CM

## 2019-04-11 DIAGNOSIS — I214 Non-ST elevation (NSTEMI) myocardial infarction: Secondary | ICD-10-CM | POA: Diagnosis present

## 2019-04-11 DIAGNOSIS — I5043 Acute on chronic combined systolic (congestive) and diastolic (congestive) heart failure: Secondary | ICD-10-CM | POA: Diagnosis not present

## 2019-04-11 DIAGNOSIS — E870 Hyperosmolality and hypernatremia: Secondary | ICD-10-CM | POA: Diagnosis not present

## 2019-04-11 DIAGNOSIS — E7849 Other hyperlipidemia: Secondary | ICD-10-CM | POA: Diagnosis not present

## 2019-04-11 DIAGNOSIS — I248 Other forms of acute ischemic heart disease: Secondary | ICD-10-CM | POA: Diagnosis not present

## 2019-04-11 DIAGNOSIS — Y95 Nosocomial condition: Secondary | ICD-10-CM | POA: Diagnosis not present

## 2019-04-11 DIAGNOSIS — G9340 Encephalopathy, unspecified: Secondary | ICD-10-CM | POA: Diagnosis present

## 2019-04-11 DIAGNOSIS — I110 Hypertensive heart disease with heart failure: Secondary | ICD-10-CM | POA: Diagnosis present

## 2019-04-11 DIAGNOSIS — F419 Anxiety disorder, unspecified: Secondary | ICD-10-CM | POA: Diagnosis present

## 2019-04-11 DIAGNOSIS — Z801 Family history of malignant neoplasm of trachea, bronchus and lung: Secondary | ICD-10-CM

## 2019-04-11 DIAGNOSIS — I509 Heart failure, unspecified: Secondary | ICD-10-CM

## 2019-04-11 DIAGNOSIS — R410 Disorientation, unspecified: Secondary | ICD-10-CM | POA: Diagnosis not present

## 2019-04-11 DIAGNOSIS — E7800 Pure hypercholesterolemia, unspecified: Secondary | ICD-10-CM | POA: Diagnosis not present

## 2019-04-11 DIAGNOSIS — J15212 Pneumonia due to Methicillin resistant Staphylococcus aureus: Secondary | ICD-10-CM | POA: Diagnosis present

## 2019-04-11 DIAGNOSIS — L89302 Pressure ulcer of unspecified buttock, stage 2: Secondary | ICD-10-CM | POA: Diagnosis not present

## 2019-04-11 DIAGNOSIS — D638 Anemia in other chronic diseases classified elsewhere: Secondary | ICD-10-CM | POA: Diagnosis not present

## 2019-04-11 DIAGNOSIS — J15211 Pneumonia due to Methicillin susceptible Staphylococcus aureus: Secondary | ICD-10-CM | POA: Diagnosis not present

## 2019-04-11 DIAGNOSIS — I5042 Chronic combined systolic (congestive) and diastolic (congestive) heart failure: Secondary | ICD-10-CM | POA: Diagnosis not present

## 2019-04-11 DIAGNOSIS — Z781 Physical restraint status: Secondary | ICD-10-CM

## 2019-04-11 DIAGNOSIS — Z4659 Encounter for fitting and adjustment of other gastrointestinal appliance and device: Secondary | ICD-10-CM | POA: Diagnosis not present

## 2019-04-11 DIAGNOSIS — T82838A Hemorrhage of vascular prosthetic devices, implants and grafts, initial encounter: Secondary | ICD-10-CM | POA: Diagnosis present

## 2019-04-11 DIAGNOSIS — J181 Lobar pneumonia, unspecified organism: Secondary | ICD-10-CM | POA: Diagnosis not present

## 2019-04-11 DIAGNOSIS — Z8619 Personal history of other infectious and parasitic diseases: Secondary | ICD-10-CM | POA: Diagnosis not present

## 2019-04-11 DIAGNOSIS — R6521 Severe sepsis with septic shock: Secondary | ICD-10-CM | POA: Diagnosis present

## 2019-04-11 DIAGNOSIS — I10 Essential (primary) hypertension: Secondary | ICD-10-CM | POA: Diagnosis not present

## 2019-04-11 DIAGNOSIS — E118 Type 2 diabetes mellitus with unspecified complications: Secondary | ICD-10-CM | POA: Diagnosis not present

## 2019-04-11 DIAGNOSIS — A419 Sepsis, unspecified organism: Secondary | ICD-10-CM

## 2019-04-11 DIAGNOSIS — J810 Acute pulmonary edema: Secondary | ICD-10-CM | POA: Diagnosis not present

## 2019-04-11 DIAGNOSIS — Z7902 Long term (current) use of antithrombotics/antiplatelets: Secondary | ICD-10-CM

## 2019-04-11 DIAGNOSIS — N139 Obstructive and reflux uropathy, unspecified: Secondary | ICD-10-CM | POA: Diagnosis present

## 2019-04-11 DIAGNOSIS — Z452 Encounter for adjustment and management of vascular access device: Secondary | ICD-10-CM | POA: Diagnosis not present

## 2019-04-11 DIAGNOSIS — R069 Unspecified abnormalities of breathing: Secondary | ICD-10-CM | POA: Diagnosis not present

## 2019-04-11 DIAGNOSIS — I472 Ventricular tachycardia: Secondary | ICD-10-CM | POA: Diagnosis present

## 2019-04-11 DIAGNOSIS — J9601 Acute respiratory failure with hypoxia: Secondary | ICD-10-CM

## 2019-04-11 DIAGNOSIS — I484 Atypical atrial flutter: Secondary | ICD-10-CM | POA: Diagnosis not present

## 2019-04-11 DIAGNOSIS — R0902 Hypoxemia: Secondary | ICD-10-CM | POA: Diagnosis present

## 2019-04-11 DIAGNOSIS — J90 Pleural effusion, not elsewhere classified: Secondary | ICD-10-CM | POA: Diagnosis not present

## 2019-04-11 DIAGNOSIS — E1122 Type 2 diabetes mellitus with diabetic chronic kidney disease: Secondary | ICD-10-CM | POA: Diagnosis not present

## 2019-04-11 DIAGNOSIS — J80 Acute respiratory distress syndrome: Secondary | ICD-10-CM | POA: Diagnosis present

## 2019-04-11 DIAGNOSIS — J441 Chronic obstructive pulmonary disease with (acute) exacerbation: Secondary | ICD-10-CM | POA: Diagnosis not present

## 2019-04-11 DIAGNOSIS — L8992 Pressure ulcer of unspecified site, stage 2: Secondary | ICD-10-CM | POA: Diagnosis present

## 2019-04-11 DIAGNOSIS — R1110 Vomiting, unspecified: Secondary | ICD-10-CM | POA: Diagnosis not present

## 2019-04-11 DIAGNOSIS — U071 COVID-19: Secondary | ICD-10-CM | POA: Diagnosis not present

## 2019-04-11 DIAGNOSIS — R05 Cough: Secondary | ICD-10-CM | POA: Diagnosis not present

## 2019-04-11 DIAGNOSIS — E119 Type 2 diabetes mellitus without complications: Secondary | ICD-10-CM | POA: Diagnosis not present

## 2019-04-11 DIAGNOSIS — A4189 Other specified sepsis: Principal | ICD-10-CM | POA: Diagnosis present

## 2019-04-11 DIAGNOSIS — R0600 Dyspnea, unspecified: Secondary | ICD-10-CM

## 2019-04-11 DIAGNOSIS — R404 Transient alteration of awareness: Secondary | ICD-10-CM | POA: Diagnosis not present

## 2019-04-11 DIAGNOSIS — I4891 Unspecified atrial fibrillation: Secondary | ICD-10-CM | POA: Diagnosis present

## 2019-04-11 DIAGNOSIS — I2582 Chronic total occlusion of coronary artery: Secondary | ICD-10-CM | POA: Diagnosis present

## 2019-04-11 DIAGNOSIS — E876 Hypokalemia: Secondary | ICD-10-CM | POA: Diagnosis not present

## 2019-04-11 DIAGNOSIS — R338 Other retention of urine: Secondary | ICD-10-CM | POA: Diagnosis not present

## 2019-04-11 DIAGNOSIS — R5381 Other malaise: Secondary | ICD-10-CM | POA: Diagnosis not present

## 2019-04-11 DIAGNOSIS — R918 Other nonspecific abnormal finding of lung field: Secondary | ICD-10-CM | POA: Diagnosis not present

## 2019-04-11 DIAGNOSIS — J431 Panlobular emphysema: Secondary | ICD-10-CM | POA: Diagnosis not present

## 2019-04-11 DIAGNOSIS — Z7952 Long term (current) use of systemic steroids: Secondary | ICD-10-CM

## 2019-04-11 DIAGNOSIS — J439 Emphysema, unspecified: Secondary | ICD-10-CM | POA: Diagnosis present

## 2019-04-11 DIAGNOSIS — E8770 Fluid overload, unspecified: Secondary | ICD-10-CM | POA: Diagnosis not present

## 2019-04-11 DIAGNOSIS — Z9911 Dependence on respirator [ventilator] status: Secondary | ICD-10-CM | POA: Diagnosis not present

## 2019-04-11 DIAGNOSIS — J9602 Acute respiratory failure with hypercapnia: Secondary | ICD-10-CM | POA: Diagnosis not present

## 2019-04-11 DIAGNOSIS — M069 Rheumatoid arthritis, unspecified: Secondary | ICD-10-CM | POA: Diagnosis present

## 2019-04-11 DIAGNOSIS — M05721 Rheumatoid arthritis with rheumatoid factor of right elbow without organ or systems involvement: Secondary | ICD-10-CM | POA: Diagnosis not present

## 2019-04-11 DIAGNOSIS — I340 Nonrheumatic mitral (valve) insufficiency: Secondary | ICD-10-CM | POA: Diagnosis not present

## 2019-04-11 DIAGNOSIS — Z794 Long term (current) use of insulin: Secondary | ICD-10-CM

## 2019-04-11 DIAGNOSIS — Z9289 Personal history of other medical treatment: Secondary | ICD-10-CM

## 2019-04-11 DIAGNOSIS — J189 Pneumonia, unspecified organism: Secondary | ICD-10-CM | POA: Diagnosis not present

## 2019-04-11 DIAGNOSIS — Z955 Presence of coronary angioplasty implant and graft: Secondary | ICD-10-CM | POA: Diagnosis not present

## 2019-04-11 DIAGNOSIS — I2583 Coronary atherosclerosis due to lipid rich plaque: Secondary | ICD-10-CM | POA: Diagnosis not present

## 2019-04-11 DIAGNOSIS — J129 Viral pneumonia, unspecified: Secondary | ICD-10-CM | POA: Diagnosis not present

## 2019-04-11 DIAGNOSIS — F17210 Nicotine dependence, cigarettes, uncomplicated: Secondary | ICD-10-CM | POA: Diagnosis present

## 2019-04-11 DIAGNOSIS — E875 Hyperkalemia: Secondary | ICD-10-CM | POA: Diagnosis not present

## 2019-04-11 DIAGNOSIS — R0602 Shortness of breath: Secondary | ICD-10-CM | POA: Diagnosis not present

## 2019-04-11 DIAGNOSIS — R7989 Other specified abnormal findings of blood chemistry: Secondary | ICD-10-CM | POA: Diagnosis not present

## 2019-04-11 DIAGNOSIS — T502X5A Adverse effect of carbonic-anhydrase inhibitors, benzothiadiazides and other diuretics, initial encounter: Secondary | ICD-10-CM | POA: Diagnosis not present

## 2019-04-11 DIAGNOSIS — I5023 Acute on chronic systolic (congestive) heart failure: Secondary | ICD-10-CM | POA: Diagnosis not present

## 2019-04-11 DIAGNOSIS — J690 Pneumonitis due to inhalation of food and vomit: Secondary | ICD-10-CM | POA: Diagnosis not present

## 2019-04-11 DIAGNOSIS — Z79899 Other long term (current) drug therapy: Secondary | ICD-10-CM

## 2019-04-11 DIAGNOSIS — F411 Generalized anxiety disorder: Secondary | ICD-10-CM | POA: Diagnosis not present

## 2019-04-11 DIAGNOSIS — E1151 Type 2 diabetes mellitus with diabetic peripheral angiopathy without gangrene: Secondary | ICD-10-CM | POA: Diagnosis not present

## 2019-04-11 DIAGNOSIS — E871 Hypo-osmolality and hyponatremia: Secondary | ICD-10-CM | POA: Diagnosis present

## 2019-04-11 DIAGNOSIS — R7309 Other abnormal glucose: Secondary | ICD-10-CM | POA: Diagnosis not present

## 2019-04-11 DIAGNOSIS — Y848 Other medical procedures as the cause of abnormal reaction of the patient, or of later complication, without mention of misadventure at the time of the procedure: Secondary | ICD-10-CM | POA: Diagnosis not present

## 2019-04-11 DIAGNOSIS — K219 Gastro-esophageal reflux disease without esophagitis: Secondary | ICD-10-CM | POA: Diagnosis present

## 2019-04-11 DIAGNOSIS — G4700 Insomnia, unspecified: Secondary | ICD-10-CM | POA: Diagnosis not present

## 2019-04-11 DIAGNOSIS — I517 Cardiomegaly: Secondary | ICD-10-CM | POA: Diagnosis not present

## 2019-04-11 DIAGNOSIS — J1520 Pneumonia due to staphylococcus, unspecified: Secondary | ICD-10-CM | POA: Diagnosis present

## 2019-04-11 DIAGNOSIS — Z209 Contact with and (suspected) exposure to unspecified communicable disease: Secondary | ICD-10-CM | POA: Diagnosis not present

## 2019-04-11 DIAGNOSIS — I70202 Unspecified atherosclerosis of native arteries of extremities, left leg: Secondary | ICD-10-CM | POA: Diagnosis present

## 2019-04-11 DIAGNOSIS — Z7982 Long term (current) use of aspirin: Secondary | ICD-10-CM

## 2019-04-11 DIAGNOSIS — R1310 Dysphagia, unspecified: Secondary | ICD-10-CM | POA: Diagnosis not present

## 2019-04-11 DIAGNOSIS — E1169 Type 2 diabetes mellitus with other specified complication: Secondary | ICD-10-CM | POA: Diagnosis not present

## 2019-04-11 DIAGNOSIS — R451 Restlessness and agitation: Secondary | ICD-10-CM | POA: Diagnosis not present

## 2019-04-11 DIAGNOSIS — R29818 Other symptoms and signs involving the nervous system: Secondary | ICD-10-CM | POA: Diagnosis not present

## 2019-04-11 DIAGNOSIS — IMO0002 Reserved for concepts with insufficient information to code with codable children: Secondary | ICD-10-CM | POA: Diagnosis present

## 2019-04-11 DIAGNOSIS — I361 Nonrheumatic tricuspid (valve) insufficiency: Secondary | ICD-10-CM | POA: Diagnosis not present

## 2019-04-11 DIAGNOSIS — F064 Anxiety disorder due to known physiological condition: Secondary | ICD-10-CM | POA: Diagnosis not present

## 2019-04-11 DIAGNOSIS — R6889 Other general symptoms and signs: Secondary | ICD-10-CM | POA: Diagnosis not present

## 2019-04-11 DIAGNOSIS — Z800 Family history of malignant neoplasm of digestive organs: Secondary | ICD-10-CM

## 2019-04-11 DIAGNOSIS — I255 Ischemic cardiomyopathy: Secondary | ICD-10-CM | POA: Diagnosis present

## 2019-04-11 DIAGNOSIS — T41295A Adverse effect of other general anesthetics, initial encounter: Secondary | ICD-10-CM | POA: Diagnosis not present

## 2019-04-11 DIAGNOSIS — I5031 Acute diastolic (congestive) heart failure: Secondary | ICD-10-CM | POA: Diagnosis not present

## 2019-04-11 DIAGNOSIS — H9190 Unspecified hearing loss, unspecified ear: Secondary | ICD-10-CM | POA: Diagnosis not present

## 2019-04-11 DIAGNOSIS — M05719 Rheumatoid arthritis with rheumatoid factor of unspecified shoulder without organ or systems involvement: Secondary | ICD-10-CM | POA: Diagnosis not present

## 2019-04-11 DIAGNOSIS — I130 Hypertensive heart and chronic kidney disease with heart failure and stage 1 through stage 4 chronic kidney disease, or unspecified chronic kidney disease: Secondary | ICD-10-CM | POA: Diagnosis not present

## 2019-04-11 DIAGNOSIS — G4733 Obstructive sleep apnea (adult) (pediatric): Secondary | ICD-10-CM | POA: Diagnosis present

## 2019-04-11 DIAGNOSIS — L89153 Pressure ulcer of sacral region, stage 3: Secondary | ICD-10-CM | POA: Diagnosis not present

## 2019-04-11 DIAGNOSIS — I952 Hypotension due to drugs: Secondary | ICD-10-CM | POA: Diagnosis not present

## 2019-04-11 LAB — URINALYSIS, ROUTINE W REFLEX MICROSCOPIC
Bacteria, UA: NONE SEEN
Bilirubin Urine: NEGATIVE
Glucose, UA: 50 mg/dL — AB
Ketones, ur: 5 mg/dL — AB
Leukocytes,Ua: NEGATIVE
Nitrite: NEGATIVE
Protein, ur: >=300 mg/dL — AB
Specific Gravity, Urine: 1.025 (ref 1.005–1.030)
pH: 5 (ref 5.0–8.0)

## 2019-04-11 LAB — COMPREHENSIVE METABOLIC PANEL
ALT: 33 U/L (ref 0–44)
AST: 33 U/L (ref 15–41)
Albumin: 3 g/dL — ABNORMAL LOW (ref 3.5–5.0)
Alkaline Phosphatase: 66 U/L (ref 38–126)
Anion gap: 10 (ref 5–15)
BUN: 20 mg/dL (ref 8–23)
CO2: 22 mmol/L (ref 22–32)
Calcium: 8.4 mg/dL — ABNORMAL LOW (ref 8.9–10.3)
Chloride: 100 mmol/L (ref 98–111)
Creatinine, Ser: 0.8 mg/dL (ref 0.61–1.24)
GFR calc Af Amer: >60 mL/min (ref >60)
GFR calc non Af Amer: >60 mL/min (ref >60)
Glucose, Bld: 220 mg/dL — ABNORMAL HIGH (ref 70–99)
Potassium: 4.2 mmol/L (ref 3.5–5.1)
Sodium: 132 mmol/L — ABNORMAL LOW (ref 135–145)
Total Bilirubin: 0.4 mg/dL (ref 0.3–1.2)
Total Protein: 6.7 g/dL (ref 6.5–8.1)

## 2019-04-11 LAB — CBC WITH DIFFERENTIAL/PLATELET
Abs Immature Granulocytes: 0.01 10*3/uL (ref 0.00–0.07)
Basophils Absolute: 0 10*3/uL (ref 0.0–0.1)
Basophils Relative: 0 %
Eosinophils Absolute: 0 10*3/uL (ref 0.0–0.5)
Eosinophils Relative: 0 %
HCT: 42.9 % (ref 39.0–52.0)
Hemoglobin: 14.5 g/dL (ref 13.0–17.0)
Immature Granulocytes: 0 %
Lymphocytes Relative: 8 %
Lymphs Abs: 0.3 10*3/uL — ABNORMAL LOW (ref 0.7–4.0)
MCH: 29.7 pg (ref 26.0–34.0)
MCHC: 33.8 g/dL (ref 30.0–36.0)
MCV: 87.7 fL (ref 80.0–100.0)
Monocytes Absolute: 0.3 10*3/uL (ref 0.1–1.0)
Monocytes Relative: 6 %
Neutro Abs: 3.5 10*3/uL (ref 1.7–7.7)
Neutrophils Relative %: 86 %
Platelets: 102 10*3/uL — ABNORMAL LOW (ref 150–400)
RBC: 4.89 MIL/uL (ref 4.22–5.81)
RDW: 14.8 % (ref 11.5–15.5)
WBC: 4 10*3/uL (ref 4.0–10.5)
nRBC: 0 % (ref 0.0–0.2)

## 2019-04-11 LAB — SARS CORONAVIRUS 2 BY RT PCR (HOSPITAL ORDER, PERFORMED IN ~~LOC~~ HOSPITAL LAB): SARS Coronavirus 2: POSITIVE — AB

## 2019-04-11 LAB — PROTIME-INR
INR: 1.1 (ref 0.8–1.2)
Prothrombin Time: 14.5 seconds (ref 11.4–15.2)

## 2019-04-11 LAB — APTT: aPTT: 32 seconds (ref 24–36)

## 2019-04-11 LAB — LACTIC ACID, PLASMA: Lactic Acid, Venous: 1.2 mmol/L (ref 0.5–1.9)

## 2019-04-11 MED ORDER — METRONIDAZOLE IN NACL 5-0.79 MG/ML-% IV SOLN
500.0000 mg | Freq: Once | INTRAVENOUS | Status: AC
  Administered 2019-04-11: 500 mg via INTRAVENOUS
  Filled 2019-04-11: qty 100

## 2019-04-11 MED ORDER — SODIUM CHLORIDE 0.9 % IV SOLN
INTRAVENOUS | Status: DC
  Administered 2019-04-12 – 2019-04-13 (×2): via INTRAVENOUS

## 2019-04-11 MED ORDER — VANCOMYCIN HCL IN DEXTROSE 750-5 MG/150ML-% IV SOLN
750.0000 mg | Freq: Two times a day (BID) | INTRAVENOUS | Status: DC
  Administered 2019-04-12: 03:00:00 750 mg via INTRAVENOUS
  Filled 2019-04-11 (×2): qty 150

## 2019-04-11 MED ORDER — SODIUM CHLORIDE 0.9 % IV SOLN
2.0000 g | Freq: Once | INTRAVENOUS | Status: AC
  Administered 2019-04-11: 2 g via INTRAVENOUS
  Filled 2019-04-11: qty 2

## 2019-04-11 MED ORDER — SODIUM CHLORIDE 0.9 % IV SOLN
2.0000 g | Freq: Three times a day (TID) | INTRAVENOUS | Status: DC
  Administered 2019-04-12 – 2019-04-15 (×11): 2 g via INTRAVENOUS
  Filled 2019-04-11 (×11): qty 2

## 2019-04-11 MED ORDER — VANCOMYCIN HCL IN DEXTROSE 1-5 GM/200ML-% IV SOLN
1000.0000 mg | Freq: Once | INTRAVENOUS | Status: AC
  Administered 2019-04-12: 1000 mg via INTRAVENOUS
  Filled 2019-04-11: qty 200

## 2019-04-11 MED ORDER — SODIUM CHLORIDE 0.9 % IV BOLUS
1000.0000 mL | Freq: Once | INTRAVENOUS | Status: AC
  Administered 2019-04-12: 01:00:00 1000 mL via INTRAVENOUS

## 2019-04-11 NOTE — ED Notes (Signed)
Pt given ice water per request.

## 2019-04-11 NOTE — ED Notes (Signed)
Pt has consumed three cups of water (8 oz each)

## 2019-04-11 NOTE — Progress Notes (Signed)
Pharmacy Antibiotic Note  Robert [name] is a 68 y.o. male admitted on 04/11/2019 with infection of unknown source Pharmacy has been consulted for vancomycin and cefepime  dosing.  Plan: Loading dose:  Vancomycin 1g x1 dose Maintenance dose:  Vancomycin 750mg  IV q12h, starting at 0200 on 8/1 (starting early since pt didn't get   full loading dose) Goal vancomycin  trough range: 15-20   mcg/mL Pharmacy will continue to monitor renal function, vancomycin troughs as clinically indicated, cultures and patient progress.   Height: 6' (182.9 cm) Weight: 225 lb (102.1 kg) IBW/kg (Calculated) : 77.6  Temp (24hrs), Avg:100.5 F (38.1 C), Min:100.5 F (38.1 C), Max:100.5 F (38.1 C)  Recent Labs  Lab 04/11/19 2125  WBC 4.0  CREATININE 0.80  LATICACIDVEN 1.2    Estimated Creatinine Clearance: 113.8 mL/min (by C-G formula based on SCr of 0.8 mg/dL).    Allergies  Allergen Reactions  . Ivp Dye [Iodinated Diagnostic Agents] Itching and Rash    Antimicrobials this admission: vancomycin 7/31 >>  cefepime 7/31 >>  metronidazole 7/31>>  Microbiology results: 7/31 [facility] x2:  pend 7/31 UCx:  pend 7/31 SARS CoV-2: pend  Thank you for allowing pharmacy to be a part of this patient's care.  Despina Pole 04/11/2019 10:54 PM

## 2019-04-11 NOTE — ED Provider Notes (Addendum)
[facility] EMERGENCY DEPARTMENT Provider Note   CSN: [id number] Arrival date & time: 04/11/19  2113     History   Chief Complaint Chief Complaint  Patient presents with  . Shortness of Breath    HPI Robert [name] is a 68 y.o. male.     Patient brought in by EMS.  Patient is had fever and shortness of breath for 2 days.  Patient had COVID-19 testing done yesterday as an outpatient but results are not back yet.  Patient diaphoretic on arrival.  Patient with an oxygen requirement.  On oxygen patient sats are 91 to 94%.  Patient does not use oxygen at home.  No known exposure to COVID-19.  Past medical history is significant for high cholesterol ischemic cardiomyopathy coronary artery disease COPD.  Hypertension and diabetes.  Past history of myocardial infarction.  Cardiac catheterization in 2013.     Past Medical History:  Diagnosis Date  . Anxiety   . COPD (chronic obstructive pulmonary disease) ([place])   . Coronary artery disease   . Full dentures   . GERD (gastroesophageal reflux disease)    Rolaids as needed  . High cholesterol   . History of MI (myocardial infarction)   . Hypertension    med. dosage increased 04/2016; has been on BP med. x 10 yrs.  . Incarcerated umbilical hernia 66/5993  . Inguinal hernia 05/2016   bilateral   . Insulin dependent diabetes mellitus ([place])   . Ischemic cardiomyopathy    a. 03/2015 EF 40-45% by LV gram.  . Left leg cellulitis 10/08/2016  . Rheumatoid arthritis(714.0)     Patient Active Problem List   Diagnosis Date Noted  . PAD (peripheral artery disease) ([place]) 03/27/2019  . Pulmonary edema 08/27/2018  . Acute respiratory failure with hypoxia ([place]) 08/27/2018  . Chronic combined systolic and diastolic heart failure ([place]) 08/27/2018  . Dyslipidemia (high LDL; low HDL) 03/29/2018  . Dysphagia 01/02/2018  . Diarrhea 01/02/2018  . Family history of colon cancer 01/02/2018  . Ischemic cardiomyopathy   . Insulin dependent diabetes  mellitus ([place])   . Hypertension   . History of MI (myocardial infarction)   . GERD (gastroesophageal reflux disease)   . Full dentures   . Coronary artery disease   . Anxiety   . Acute ST elevation myocardial infarction (STEMI) of inferior wall ([place]) 06/06/2017  . Varicose veins of left lower extremity with complications 57/09/7791  . Cellulitis of left leg without foot   . Right radial fracture 12/11/2016  . T3 vertebral fracture ([place]) 12/11/2016  . Fall 12/09/2016  . Thrombocytopenia ([place]) 10/09/2016  . Left leg cellulitis 10/08/2016  . Diabetes ([place]) 10/08/2016  . Inguinal hernia 05/12/2016  . Incarcerated umbilical hernia 90/30/0923  . Chronic combined systolic and diastolic CHF (congestive heart failure) ([place]) 04/25/2016  . High cholesterol   . Controlled type 2 diabetes mellitus with diabetic peripheral angiopathy without gangrene, with long-term current use of insulin ([place])   . Essential hypertension   . Non-STEMI (non-ST elevated myocardial infarction) ([place])   . CAD S/P percutaneous coronary angioplasty   . Unstable angina ([place]) 03/20/2015  . ICM-EF 35% at cath 02/01/15 02/02/2015  . CAD (coronary artery disease) 01/09/2015  . DM type 2 causing vascular disease, not at goal [facility]) 05/29/2014  . Dyspnea 10/20/2013  . COPD (chronic obstructive pulmonary disease) ([place]) 09/03/2013  . Hyperlipidemia   . Old myocardial infarction 09/01/2013  . Abnormal nuclear stress test 05/17/2012  . S/P CFX PTCAI for  ISR 02/01/15 05/17/2012  . PVD, chronic LLE 12/22/2011  . Rheumatoid arthritis ([place]) 12/22/2011  . Contrast media allergy 12/22/2011  . Tobacco abuse 12/21/2011    Past Surgical History:  Procedure Laterality Date  . CARDIAC CATHETERIZATION N/A 02/01/2015   Procedure: Left Heart Cath and Coronary Angiography;  Surgeon: Belva Crome, MD;  Location: [place] CV LAB;  Service: Cardiovascular;  Laterality: N/A;  . CARDIAC CATHETERIZATION N/A 03/22/2015   Procedure: Left Heart  Cath and Coronary Angiography;  Surgeon: Leonie Man, MD;  Location: [place] CV LAB;  Service: Cardiovascular;  Laterality: N/A;  . CARDIAC CATHETERIZATION  09/05/2002; 03/09/2003; 04/10/2005;06/02/2005; 05/09/2006; 02/09/2007; 03/01/2007  . COLONOSCOPY  2008   Dr. Gala Romney: diverticulosis, hyperplastic polyp.  . CORONARY ANGIOPLASTY  11/14/2012; 02/01/2015  . CORONARY ANGIOPLASTY WITH STENT PLACEMENT  05/16/2012   "1; makes total ~ 4"  . CORONARY STENT INTERVENTION N/A 06/06/2017   Procedure: CORONARY STENT INTERVENTION;  Surgeon: Belva Crome, MD;  Location: [place] CV LAB;  Service: Cardiovascular;  Laterality: N/A;  . INSERTION OF MESH Bilateral 05/24/2016   Procedure: INSERTION OF MESH;  Surgeon: Coralie Keens, MD;  Location: [place];  Service: General;  Laterality: Bilateral;  . LAPAROSCOPIC CHOLECYSTECTOMY    . LAPAROSCOPIC INGUINAL HERNIA WITH UMBILICAL HERNIA Bilateral 05/24/2016   Procedure: BILATERAL LAPAROSCOPIC INGUINAL HERNIA REPAIR WITH MESH AND UMBILICAL HERNIA REPAIR WITH MESH;  Surgeon: Coralie Keens, MD;  Location: [place];  Service: General;  Laterality: Bilateral;  . LEFT HEART CATH AND CORONARY ANGIOGRAPHY N/A 06/06/2017   Procedure: LEFT HEART CATH AND CORONARY ANGIOGRAPHY;  Surgeon: Belva Crome, MD;  Location: [place] CV LAB;  Service: Cardiovascular;  Laterality: N/A;  . LEFT HEART CATHETERIZATION WITH CORONARY ANGIOGRAM N/A 12/22/2011   Procedure: LEFT HEART CATHETERIZATION WITH CORONARY ANGIOGRAM;  Surgeon: Troy Sine, MD;  Location: [facility] CATH LAB;  Service: Cardiovascular;  Laterality: N/A;  . LEFT HEART CATHETERIZATION WITH CORONARY ANGIOGRAM Bilateral 05/16/2012   Procedure: LEFT HEART CATHETERIZATION WITH CORONARY ANGIOGRAM;  Surgeon: Lorretta Harp, MD;  Location: [facility] CATH LAB;  Service: Cardiovascular;  Laterality: Bilateral;  . LEFT HEART CATHETERIZATION WITH CORONARY ANGIOGRAM N/A 11/14/2012   Procedure: LEFT HEART  CATHETERIZATION WITH CORONARY ANGIOGRAM;  Surgeon: Troy Sine, MD;  Location: [facility] CATH LAB;  Service: Cardiovascular;  Laterality: N/A;  . LEFT HEART CATHETERIZATION WITH CORONARY ANGIOGRAM N/A 09/01/2013   Procedure: LEFT HEART CATHETERIZATION WITH CORONARY ANGIOGRAM;  Surgeon: Leonie Man, MD;  Location: [facility] CATH LAB;  Service: Cardiovascular;  Laterality: N/A;  . [place]; 1973; 1985   x 3  . NM MYOCAR PERF WALL MOTION  08/30/2009   No significant ischemia  . OPEN REDUCTION INTERNAL FIXATION (ORIF) DISTAL RADIAL FRACTURE Right 12/10/2016   Procedure: OPEN REDUCTION INTERNAL FIXATION (ORIF) DISTAL RADIAL FRACTURE;  Surgeon: Iran Planas, MD;  Location: [place];  Service: Orthopedics;  Laterality: Right;  . PERCUTANEOUS CORONARY INTERVENTION-BALLOON ONLY  12/22/2011   Procedure: PERCUTANEOUS CORONARY INTERVENTION-BALLOON ONLY;  Surgeon: Troy Sine, MD;  Location: [facility] CATH LAB;  Service: Cardiovascular;;  . PERCUTANEOUS CORONARY INTERVENTION-BALLOON ONLY  11/14/2012   Procedure: PERCUTANEOUS CORONARY INTERVENTION-BALLOON ONLY;  Surgeon: Troy Sine, MD;  Location: [facility] CATH LAB;  Service: Cardiovascular;;  . PERCUTANEOUS CORONARY STENT INTERVENTION (PCI-S)  05/16/2012   Procedure: PERCUTANEOUS CORONARY STENT INTERVENTION (PCI-S);  Surgeon: Lorretta Harp, MD;  Location: [facility] CATH LAB;  Service: Cardiovascular;;  . PERCUTANEOUS CORONARY STENT INTERVENTION (  PCI-S)  09/01/2013   Procedure: PERCUTANEOUS CORONARY STENT INTERVENTION (PCI-S);  Surgeon: Leonie Man, MD;  Location: [facility] CATH LAB;  Service: Cardiovascular;;        Home Medications    Prior to Admission medications   Medication Sig Start Date End Date Taking? Authorizing Provider  acarbose (PRECOSE) 100 MG tablet Take 100 mg by mouth 3 (three) times daily with meals.  03/08/17   [provider]  acetaminophen (TYLENOL) 650 MG CR tablet Take 650 mg by mouth every 8 (eight) hours as needed for pain.     [provider]  ALPRAZolam Duanne Moron) 0.5 MG tablet Take 0.5 mg by mouth 3 (three) times daily as needed for anxiety.     [provider]  AMBULATORY NON FORMULARY MEDICATION Take 600 mg by mouth daily at 12 noon. Medication Name: Dalcetrapib vs placebo Patient taking differently: Take 600 mg by mouth every evening. Medication Name: Dalcetrapib vs placebo: Cholesterol 12/15/16   Jettie Booze, MD  aspirin EC 81 MG EC tablet Take 1 tablet (81 mg total) by mouth daily. 11/15/12   Brett Canales, PA-C  atorvastatin (LIPITOR) 80 MG tablet TAKE 1 TABLET DAILY AT 6 PM. 10/30/18   Croitoru, Mihai, MD  clopidogrel (PLAVIX) 75 MG tablet TAKE (1) TABLET BY MOUTH ONCE DAILY. 02/17/19   Croitoru, Mihai, MD  fish oil-omega-3 fatty acids 1000 MG capsule Take 1 g by mouth daily.     [provider]  folic acid (FOLVITE) 1 MG tablet Take 1 mg by mouth daily.     [provider]  furosemide (LASIX) 40 MG tablet Take 80mg  po bid for 5 days then 80mg  po daily 02/17/19   Croitoru, Mihai, MD  gabapentin (NEURONTIN) 300 MG capsule Take 300 mg by mouth 3 (three) times daily as needed (for pain).  03/10/17   [provider]  glipiZIDE (GLUCOTROL) 10 MG tablet Take 10 mg by mouth 2 (two) times daily. 02/12/17   [provider]  Insulin Glargine (TOUJEO SOLOSTAR [place]) Inject 80 Units into the skin daily.    [provider]  isosorbide mononitrate (IMDUR) 60 MG 24 hr tablet Take 1 tablet (60 mg total) by mouth daily. 03/25/19   Croitoru, Mihai, MD  losartan (COZAAR) 25 MG tablet Take 0.5 tablets (12.5 mg total) by mouth daily. 06/12/17   Bhagat, Crista Luria, PA  metFORMIN (GLUCOPHAGE) 1000 MG tablet Take 1 tablet (1,000 mg total) by mouth 2 (two) times daily with a meal. Patient taking differently: Take 1,000-1,250 mg by mouth See admin instructions. Take 1250 mg by mouth in the morning and 1000 mg in the evening 03/23/15   Bhagat, Hawi, PA  methotrexate 50 MG/2ML  injection Inject 2 mLs into the skin once a week. Takes every Thursday 12/01/17   [provider]  metoprolol tartrate (LOPRESSOR) 25 MG tablet TAKE 1 TABLET TWICE DAILY. KEEP JULY APPOINTMENT. 02/24/19   Croitoru, Mihai, MD  nitroGLYCERIN (NITROSTAT) 0.4 MG SL tablet Place 1 tablet (0.4 mg total) under the tongue every 5 (five) minutes x 3 doses as needed for chest pain. 04/24/16   Croitoru, Mihai, MD  potassium chloride SA (K-DUR) 20 MEQ tablet 1 TABLET BY MOUTH DAILY. 03/13/19   Croitoru, Mihai, MD  primidone (MYSOLINE) 50 MG tablet Take 100 mg by mouth daily.     [provider]  Tocilizumab 162 MG/0.9ML SOSY Inject 162 mg into the skin every Monday. ACTEMRA    [provider]    Family  History Family History  Problem Relation Age of Onset  . Heart disease Father   . Colon cancer Brother        early 76s  . Hypertension Mother   . Heart disease Mother   . Colon cancer Mother        died age 44. late 53s early 33s at diagnosis.   . Lung cancer Mother     Social History Social History   Tobacco Use  . Smoking status: Current Some Day Smoker    Packs/day: 1.00    Types: Cigarettes    Last attempt to quit: 06/11/2015    Years since quitting: 3.8  . Smokeless tobacco: Never Used  . Tobacco comment: pt states smoking less than half a pack  Substance Use Topics  . Alcohol use: No  . Drug use: No     Allergies   Ivp dye [iodinated diagnostic agents]   Review of Systems Review of Systems  Constitutional: Positive for diaphoresis and fever. Negative for chills.  HENT: Negative for rhinorrhea and sore throat.   Eyes: Negative for visual disturbance.  Respiratory: Positive for shortness of breath. Negative for cough.   Cardiovascular: Negative for chest pain and leg swelling.  Gastrointestinal: Negative for abdominal pain, diarrhea, nausea and vomiting.  Genitourinary: Negative for dysuria.  Musculoskeletal: Negative for back pain and neck pain.  Skin:  Negative for rash.  Neurological: Negative for dizziness, light-headedness and headaches.  Hematological: Does not bruise/bleed easily.  Psychiatric/Behavioral: Negative for confusion.     Physical Exam Updated Vital Signs BP (!) 157/87   Pulse (!) 117   Temp (!) 100.5 F (38.1 C) (Oral)   Resp (!) 25   Ht 1.829 m (6')   Wt 102.1 kg   SpO2 94%   BMI 30.52 kg/m   Physical Exam Vitals signs and nursing note reviewed.  Constitutional:      General: He is in acute distress.     Appearance: He is well-developed. He is toxic-appearing and diaphoretic.  HENT:     Head: Normocephalic and atraumatic.  Eyes:     Extraocular Movements: Extraocular movements intact.     Conjunctiva/sclera: Conjunctivae normal.     Pupils: Pupils are equal, round, and reactive to light.  Neck:     Musculoskeletal: Normal range of motion and neck supple.  Cardiovascular:     Rate and Rhythm: Normal rate and regular rhythm.     Heart sounds: No murmur.  Pulmonary:     Effort: Pulmonary effort is normal. No respiratory distress.     Breath sounds: Normal breath sounds.  Abdominal:     Palpations: Abdomen is soft.     Tenderness: There is no abdominal tenderness.  Musculoskeletal: Normal range of motion.        General: Swelling present.  Skin:    General: Skin is warm.  Neurological:     General: No focal deficit present.     Mental Status: He is alert and oriented to person, place, and time.      ED Treatments / Results  Labs (all labs ordered are listed, but only abnormal results are displayed) Labs Reviewed  COMPREHENSIVE METABOLIC PANEL - Abnormal; Notable for the following components:      Result Value   Sodium 132 (*)    Glucose, Bld 220 (*)    Calcium 8.4 (*)    Albumin 3.0 (*)    All other components within normal limits  CBC WITH DIFFERENTIAL/PLATELET - Abnormal; Notable for  the following components:   Platelets 102 (*)    Lymphs Abs 0.3 (*)    All other components within  normal limits  CULTURE, BLOOD (ROUTINE X 2)  CULTURE, BLOOD (ROUTINE X 2)  URINE CULTURE  SARS CORONAVIRUS 2 (HOSPITAL ORDER, [place] LAB)  LACTIC ACID, PLASMA  LACTIC ACID, PLASMA  APTT  PROTIME-INR  URINALYSIS, ROUTINE W REFLEX MICROSCOPIC    EKG EKG Interpretation  Date/Time:  Friday April 11 2019 21:23:27 EDT Ventricular Rate:  118 PR Interval:    QRS Duration: 99 QT Interval:  327 QTC Calculation: 459 R Axis:   -13 Text Interpretation:  Sinus tachycardia Borderline low voltage, extremity leads Borderline ST depression, anterolateral leads Baseline wander in lead(s) I II III aVL aVF V1 V4 V5 V6 Confirmed by Fredia Sorrow [phone]) on 04/11/2019 9:32:16 PM   Radiology Dg Chest Port 1 View  Result Date: 04/11/2019 CLINICAL DATA:  Dry cough and shortness of breath over the last 2 days. EXAM: PORTABLE CHEST 1 VIEW COMPARISON:  08/26/2018 FINDINGS: Bilateral bronchopneumonia, worse on the left than the right. Left disease is more pronounced in the mid and lower lung and right lung disease is more pronounced in the lower lung. Small amount of effusion on the left. This could be a pattern of bacterial or viral pneumonia. Heart size is normal with left ventricular prominence. There are coronary artery stents. Aortic atherosclerosis is present. IMPRESSION: Bilateral bronchopneumonia left worse than right. This could be due to bacterial or viral pneumonia. Electronically Signed   By: Nelson Chimes M.D.   On: 04/11/2019 21:55    Procedures Procedures (including critical care time)   CRITICAL CARE Performed by: Fredia Sorrow Total critical care time: 45 minutes Critical care time was exclusive of separately billable procedures and treating other patients. Critical care was necessary to treat or prevent imminent or life-threatening deterioration. Critical care was time spent personally by me on the following activities: development of treatment plan with  patient and/or surrogate as well as nursing, discussions with consultants, evaluation of patient's response to treatment, examination of patient, obtaining history from patient or surrogate, ordering and performing treatments and interventions, ordering and review of laboratory studies, ordering and review of radiographic studies, pulse oximetry and re-evaluation of patient's condition.  Medications Ordered in ED Medications  ceFEPIme (MAXIPIME) 2 g in sodium chloride 0.9 % 100 mL IVPB (2 g Intravenous New Bag/Given 04/11/19 2214)  metroNIDAZOLE (FLAGYL) IVPB 500 mg (has no administration in time range)  vancomycin (VANCOCIN) IVPB 1000 mg/200 mL premix (has no administration in time range)  sodium chloride 0.9 % bolus 1,000 mL (has no administration in time range)  0.9 %  sodium chloride infusion (has no administration in time range)     Initial Impression / Assessment and Plan / ED Course  I have reviewed the triage vital signs and the nursing notes.  Pertinent labs & imaging results that were available during my care of the patient were reviewed by me and considered in my medical decision making (see chart for details).       Patient tachycardic febrile meets sepsis criteria.  Also significant concerns for possible COVID-19 infection.  Patient with an oxygen requirement.  Patient's oxygen saturations on the nasal cannula oxygen come into the low 90s.  Patient's lactic acid not elevated.  Chest x-ray shows a bilateral pneumonia also raising concerns for COVID-19.  Tested is pending.  Patient started on broad-spectrum antibiotics also given 1 L of fluid  as a bolus.  Patient does not meet criteria since he is not hypotensive for the full 30 cc/kg fluid amount.  EMS brought patient in 100% nonrebreather.  They stated that his oxygen sats at home were 85% on room air.  The nonrebreather was removed here.  On room air over a 5-minute.  Oxygen sats got down to around 90 but did not go below.  Patient  is currently on 2 L of oxygen and his sats are around 94%.  COVID-19 testing is important to final disposition.  Patient will require admission.  Final Clinical Impressions(s) / ED Diagnoses   Final diagnoses:  Sepsis, due to unspecified organism, unspecified whether acute organ dysfunction present [facility])  Hypoxia    ED Discharge Orders    None       Fredia Sorrow, MD 04/11/19 [id number]    Fredia Sorrow, MD 04/11/19 2243

## 2019-04-11 NOTE — ED Triage Notes (Signed)
Pt in by [facility] for sob. Pt states he had a covid 19 test yesterday but does not have results.

## 2019-04-11 NOTE — ED Notes (Addendum)
Lab at bedside drawing blood cultures Nurse starting IV getting one set of blood cultures

## 2019-04-12 ENCOUNTER — Inpatient Hospital Stay (HOSPITAL_COMMUNITY): Payer: Medicare HMO

## 2019-04-12 DIAGNOSIS — A4189 Other specified sepsis: Secondary | ICD-10-CM | POA: Diagnosis present

## 2019-04-12 DIAGNOSIS — I10 Essential (primary) hypertension: Secondary | ICD-10-CM | POA: Diagnosis not present

## 2019-04-12 DIAGNOSIS — I4891 Unspecified atrial fibrillation: Secondary | ICD-10-CM | POA: Diagnosis not present

## 2019-04-12 DIAGNOSIS — R7309 Other abnormal glucose: Secondary | ICD-10-CM | POA: Diagnosis not present

## 2019-04-12 DIAGNOSIS — I5043 Acute on chronic combined systolic (congestive) and diastolic (congestive) heart failure: Secondary | ICD-10-CM | POA: Diagnosis not present

## 2019-04-12 DIAGNOSIS — J80 Acute respiratory distress syndrome: Secondary | ICD-10-CM | POA: Diagnosis present

## 2019-04-12 DIAGNOSIS — A419 Sepsis, unspecified organism: Secondary | ICD-10-CM | POA: Insufficient documentation

## 2019-04-12 DIAGNOSIS — Z781 Physical restraint status: Secondary | ICD-10-CM | POA: Diagnosis not present

## 2019-04-12 DIAGNOSIS — R0902 Hypoxemia: Secondary | ICD-10-CM | POA: Diagnosis present

## 2019-04-12 DIAGNOSIS — I252 Old myocardial infarction: Secondary | ICD-10-CM | POA: Diagnosis not present

## 2019-04-12 DIAGNOSIS — J439 Emphysema, unspecified: Secondary | ICD-10-CM | POA: Diagnosis not present

## 2019-04-12 DIAGNOSIS — R6521 Severe sepsis with septic shock: Secondary | ICD-10-CM | POA: Diagnosis present

## 2019-04-12 DIAGNOSIS — E1169 Type 2 diabetes mellitus with other specified complication: Secondary | ICD-10-CM | POA: Diagnosis not present

## 2019-04-12 DIAGNOSIS — I5031 Acute diastolic (congestive) heart failure: Secondary | ICD-10-CM | POA: Diagnosis not present

## 2019-04-12 DIAGNOSIS — E871 Hypo-osmolality and hyponatremia: Secondary | ICD-10-CM | POA: Diagnosis present

## 2019-04-12 DIAGNOSIS — I484 Atypical atrial flutter: Secondary | ICD-10-CM | POA: Diagnosis not present

## 2019-04-12 DIAGNOSIS — E118 Type 2 diabetes mellitus with unspecified complications: Secondary | ICD-10-CM | POA: Diagnosis not present

## 2019-04-12 DIAGNOSIS — E1151 Type 2 diabetes mellitus with diabetic peripheral angiopathy without gangrene: Secondary | ICD-10-CM | POA: Diagnosis not present

## 2019-04-12 DIAGNOSIS — D696 Thrombocytopenia, unspecified: Secondary | ICD-10-CM | POA: Diagnosis present

## 2019-04-12 DIAGNOSIS — J1520 Pneumonia due to staphylococcus, unspecified: Secondary | ICD-10-CM | POA: Diagnosis not present

## 2019-04-12 DIAGNOSIS — J1289 Other viral pneumonia: Secondary | ICD-10-CM | POA: Diagnosis present

## 2019-04-12 DIAGNOSIS — E870 Hyperosmolality and hypernatremia: Secondary | ICD-10-CM | POA: Diagnosis not present

## 2019-04-12 DIAGNOSIS — Z794 Long term (current) use of insulin: Secondary | ICD-10-CM | POA: Diagnosis not present

## 2019-04-12 DIAGNOSIS — I110 Hypertensive heart disease with heart failure: Secondary | ICD-10-CM | POA: Diagnosis present

## 2019-04-12 DIAGNOSIS — I480 Paroxysmal atrial fibrillation: Secondary | ICD-10-CM | POA: Diagnosis not present

## 2019-04-12 DIAGNOSIS — R5081 Fever presenting with conditions classified elsewhere: Secondary | ICD-10-CM | POA: Diagnosis not present

## 2019-04-12 DIAGNOSIS — G9340 Encephalopathy, unspecified: Secondary | ICD-10-CM | POA: Diagnosis present

## 2019-04-12 DIAGNOSIS — M069 Rheumatoid arthritis, unspecified: Secondary | ICD-10-CM

## 2019-04-12 DIAGNOSIS — N179 Acute kidney failure, unspecified: Secondary | ICD-10-CM | POA: Diagnosis not present

## 2019-04-12 DIAGNOSIS — J9601 Acute respiratory failure with hypoxia: Secondary | ICD-10-CM | POA: Diagnosis not present

## 2019-04-12 DIAGNOSIS — R7989 Other specified abnormal findings of blood chemistry: Secondary | ICD-10-CM | POA: Diagnosis not present

## 2019-04-12 DIAGNOSIS — E1165 Type 2 diabetes mellitus with hyperglycemia: Secondary | ICD-10-CM | POA: Diagnosis not present

## 2019-04-12 DIAGNOSIS — E669 Obesity, unspecified: Secondary | ICD-10-CM | POA: Diagnosis not present

## 2019-04-12 DIAGNOSIS — I255 Ischemic cardiomyopathy: Secondary | ICD-10-CM | POA: Diagnosis present

## 2019-04-12 DIAGNOSIS — J9602 Acute respiratory failure with hypercapnia: Secondary | ICD-10-CM | POA: Diagnosis not present

## 2019-04-12 DIAGNOSIS — I361 Nonrheumatic tricuspid (valve) insufficiency: Secondary | ICD-10-CM | POA: Diagnosis not present

## 2019-04-12 DIAGNOSIS — N183 Chronic kidney disease, stage 3 (moderate): Secondary | ICD-10-CM | POA: Diagnosis not present

## 2019-04-12 DIAGNOSIS — I214 Non-ST elevation (NSTEMI) myocardial infarction: Secondary | ICD-10-CM | POA: Diagnosis not present

## 2019-04-12 DIAGNOSIS — I2510 Atherosclerotic heart disease of native coronary artery without angina pectoris: Secondary | ICD-10-CM | POA: Diagnosis present

## 2019-04-12 DIAGNOSIS — I5042 Chronic combined systolic (congestive) and diastolic (congestive) heart failure: Secondary | ICD-10-CM | POA: Diagnosis not present

## 2019-04-12 DIAGNOSIS — Y92239 Unspecified place in hospital as the place of occurrence of the external cause: Secondary | ICD-10-CM | POA: Diagnosis not present

## 2019-04-12 DIAGNOSIS — Y95 Nosocomial condition: Secondary | ICD-10-CM | POA: Diagnosis not present

## 2019-04-12 DIAGNOSIS — U071 COVID-19: Secondary | ICD-10-CM

## 2019-04-12 DIAGNOSIS — R1310 Dysphagia, unspecified: Secondary | ICD-10-CM | POA: Diagnosis not present

## 2019-04-12 DIAGNOSIS — J15212 Pneumonia due to Methicillin resistant Staphylococcus aureus: Secondary | ICD-10-CM | POA: Diagnosis not present

## 2019-04-12 DIAGNOSIS — F411 Generalized anxiety disorder: Secondary | ICD-10-CM | POA: Diagnosis not present

## 2019-04-12 DIAGNOSIS — T82838A Hemorrhage of vascular prosthetic devices, implants and grafts, initial encounter: Secondary | ICD-10-CM | POA: Diagnosis present

## 2019-04-12 DIAGNOSIS — A4102 Sepsis due to Methicillin resistant Staphylococcus aureus: Secondary | ICD-10-CM | POA: Diagnosis not present

## 2019-04-12 DIAGNOSIS — J690 Pneumonitis due to inhalation of food and vomit: Secondary | ICD-10-CM | POA: Diagnosis not present

## 2019-04-12 DIAGNOSIS — J449 Chronic obstructive pulmonary disease, unspecified: Secondary | ICD-10-CM | POA: Diagnosis not present

## 2019-04-12 DIAGNOSIS — Y848 Other medical procedures as the cause of abnormal reaction of the patient, or of later complication, without mention of misadventure at the time of the procedure: Secondary | ICD-10-CM | POA: Diagnosis not present

## 2019-04-12 DIAGNOSIS — J15211 Pneumonia due to Methicillin susceptible Staphylococcus aureus: Secondary | ICD-10-CM | POA: Diagnosis not present

## 2019-04-12 DIAGNOSIS — E8770 Fluid overload, unspecified: Secondary | ICD-10-CM | POA: Diagnosis not present

## 2019-04-12 DIAGNOSIS — F064 Anxiety disorder due to known physiological condition: Secondary | ICD-10-CM | POA: Diagnosis not present

## 2019-04-12 DIAGNOSIS — I340 Nonrheumatic mitral (valve) insufficiency: Secondary | ICD-10-CM | POA: Diagnosis not present

## 2019-04-12 DIAGNOSIS — I5033 Acute on chronic diastolic (congestive) heart failure: Secondary | ICD-10-CM | POA: Diagnosis not present

## 2019-04-12 DIAGNOSIS — J441 Chronic obstructive pulmonary disease with (acute) exacerbation: Secondary | ICD-10-CM | POA: Diagnosis not present

## 2019-04-12 DIAGNOSIS — R451 Restlessness and agitation: Secondary | ICD-10-CM | POA: Diagnosis not present

## 2019-04-12 DIAGNOSIS — L89323 Pressure ulcer of left buttock, stage 3: Secondary | ICD-10-CM | POA: Diagnosis not present

## 2019-04-12 DIAGNOSIS — I248 Other forms of acute ischemic heart disease: Secondary | ICD-10-CM | POA: Diagnosis not present

## 2019-04-12 DIAGNOSIS — E876 Hypokalemia: Secondary | ICD-10-CM | POA: Diagnosis not present

## 2019-04-12 DIAGNOSIS — J431 Panlobular emphysema: Secondary | ICD-10-CM | POA: Diagnosis not present

## 2019-04-12 DIAGNOSIS — R339 Retention of urine, unspecified: Secondary | ICD-10-CM | POA: Diagnosis not present

## 2019-04-12 DIAGNOSIS — I472 Ventricular tachycardia: Secondary | ICD-10-CM | POA: Diagnosis present

## 2019-04-12 DIAGNOSIS — R338 Other retention of urine: Secondary | ICD-10-CM | POA: Diagnosis not present

## 2019-04-12 DIAGNOSIS — I5023 Acute on chronic systolic (congestive) heart failure: Secondary | ICD-10-CM | POA: Diagnosis not present

## 2019-04-12 DIAGNOSIS — R5381 Other malaise: Secondary | ICD-10-CM | POA: Diagnosis not present

## 2019-04-12 DIAGNOSIS — Z955 Presence of coronary angioplasty implant and graft: Secondary | ICD-10-CM | POA: Diagnosis not present

## 2019-04-12 LAB — POCT I-STAT 7, (LYTES, BLD GAS, ICA,H+H)
Acid-base deficit: 6 mmol/L — ABNORMAL HIGH (ref 0.0–2.0)
Acid-base deficit: 5 mmol/L — ABNORMAL HIGH (ref 0.0–2.0)
Bicarbonate: 20.2 mmol/L (ref 20.0–28.0)
Bicarbonate: 20.1 mmol/L (ref 20.0–28.0)
Calcium, Ion: 1.13 mmol/L — ABNORMAL LOW (ref 1.15–1.40)
Calcium, Ion: 1.1 mmol/L — ABNORMAL LOW (ref 1.15–1.40)
HCT: 37 % — ABNORMAL LOW (ref 39.0–52.0)
HCT: 40 % (ref 39.0–52.0)
Hemoglobin: 12.6 g/dL — ABNORMAL LOW (ref 13.0–17.0)
Hemoglobin: 13.6 g/dL (ref 13.0–17.0)
O2 Saturation: 96 %
O2 Saturation: 99 %
Patient temperature: 98.6
Patient temperature: 37.4
Potassium: 4 mmol/L (ref 3.5–5.1)
Potassium: 4.3 mmol/L (ref 3.5–5.1)
Sodium: 133 mmol/L — ABNORMAL LOW (ref 135–145)
Sodium: 133 mmol/L — ABNORMAL LOW (ref 135–145)
TCO2: 21 mmol/L — ABNORMAL LOW (ref 22–32)
TCO2: 21 mmol/L — ABNORMAL LOW (ref 22–32)
pCO2 arterial: 39.2 mmHg (ref 32.0–48.0)
pCO2 arterial: 37.4 mmHg (ref 32.0–48.0)
pH, Arterial: 7.319 — ABNORMAL LOW (ref 7.350–7.450)
pH, Arterial: 7.341 — ABNORMAL LOW (ref 7.350–7.450)
pO2, Arterial: 86 mmHg (ref 83.0–108.0)
pO2, Arterial: 125 mmHg — ABNORMAL HIGH (ref 83.0–108.0)

## 2019-04-12 LAB — CBC
HCT: 38 % — ABNORMAL LOW (ref 39.0–52.0)
Hemoglobin: 12.8 g/dL — ABNORMAL LOW (ref 13.0–17.0)
MCH: 30.5 pg (ref 26.0–34.0)
MCHC: 33.7 g/dL (ref 30.0–36.0)
MCV: 90.5 fL (ref 80.0–100.0)
Platelets: 102 10*3/uL — ABNORMAL LOW (ref 150–400)
RBC: 4.2 MIL/uL — ABNORMAL LOW (ref 4.22–5.81)
RDW: 15.1 % (ref 11.5–15.5)
WBC: 6.1 10*3/uL (ref 4.0–10.5)
nRBC: 0 % (ref 0.0–0.2)

## 2019-04-12 LAB — BLOOD GAS, ARTERIAL
Acid-base deficit: 2.8 mmol/L — ABNORMAL HIGH (ref 0.0–2.0)
Bicarbonate: 19.4 mmol/L — ABNORMAL LOW (ref 20.0–28.0)
FIO2: 100
MECHVT: 620 mL
O2 Saturation: 94.2 %
Patient temperature: 36.8
pCO2 arterial: 72.9 mmHg (ref 32.0–48.0)
pH, Arterial: 7.159 — CL (ref 7.350–7.450)
pO2, Arterial: 99.8 mmHg (ref 83.0–108.0)

## 2019-04-12 LAB — PROCALCITONIN: Procalcitonin: 0.32 ng/mL

## 2019-04-12 LAB — SEDIMENTATION RATE: Sed Rate: 46 mm/h — ABNORMAL HIGH (ref 0–16)

## 2019-04-12 LAB — MRSA PCR SCREENING: MRSA by PCR: NEGATIVE

## 2019-04-12 LAB — FIBRINOGEN: Fibrinogen: 564 mg/dL — ABNORMAL HIGH (ref 210–475)

## 2019-04-12 LAB — TRIGLYCERIDES: Triglycerides: 184 mg/dL — ABNORMAL HIGH (ref <150)

## 2019-04-12 LAB — CREATININE, SERUM
Creatinine, Ser: 1.44 mg/dL — ABNORMAL HIGH (ref 0.61–1.24)
GFR calc Af Amer: 59 mL/min — ABNORMAL LOW
GFR calc non Af Amer: 51 mL/min — ABNORMAL LOW

## 2019-04-12 LAB — GLUCOSE, CAPILLARY
Glucose-Capillary: 242 mg/dL — ABNORMAL HIGH (ref 70–99)
Glucose-Capillary: 247 mg/dL — ABNORMAL HIGH (ref 70–99)
Glucose-Capillary: 250 mg/dL — ABNORMAL HIGH (ref 70–99)
Glucose-Capillary: 254 mg/dL — ABNORMAL HIGH (ref 70–99)
Glucose-Capillary: 255 mg/dL — ABNORMAL HIGH (ref 70–99)

## 2019-04-12 LAB — HEMOGLOBIN A1C
Hgb A1c MFr Bld: 7.4 % — ABNORMAL HIGH (ref 4.8–5.6)
Mean Plasma Glucose: 165.68 mg/dL

## 2019-04-12 LAB — MAGNESIUM
Magnesium: 1.4 mg/dL — ABNORMAL LOW (ref 1.7–2.4)
Magnesium: 1.6 mg/dL — ABNORMAL LOW (ref 1.7–2.4)

## 2019-04-12 LAB — FERRITIN: Ferritin: 495 ng/mL — ABNORMAL HIGH (ref 24–336)

## 2019-04-12 LAB — EXPECTORATED SPUTUM ASSESSMENT W GRAM STAIN, RFLX TO RESP C

## 2019-04-12 LAB — TROPONIN I (HIGH SENSITIVITY): Troponin I (High Sensitivity): 34 ng/L — ABNORMAL HIGH (ref <18)

## 2019-04-12 LAB — PHOSPHORUS
Phosphorus: 4.1 mg/dL (ref 2.5–4.6)
Phosphorus: 4.2 mg/dL (ref 2.5–4.6)

## 2019-04-12 LAB — LACTATE DEHYDROGENASE: LDH: 248 U/L — ABNORMAL HIGH (ref 98–192)

## 2019-04-12 MED ORDER — MORPHINE SULFATE (PF) 4 MG/ML IV SOLN
INTRAVENOUS | Status: AC
  Filled 2019-04-12: qty 1

## 2019-04-12 MED ORDER — POTASSIUM CHLORIDE CRYS ER 20 MEQ PO TBCR: 10.0000 meq | EXTENDED_RELEASE_TABLET | Freq: Every day | ORAL | Status: DC

## 2019-04-12 MED ORDER — FENTANYL CITRATE (PF) 100 MCG/2ML IJ SOLN: 50.0000 ug | Freq: Once | INTRAMUSCULAR | Status: DC

## 2019-04-12 MED ORDER — FENTANYL CITRATE (PF) 100 MCG/2ML IJ SOLN: 25.0000 ug | Freq: Once | INTRAMUSCULAR | Status: DC

## 2019-04-12 MED ORDER — FENTANYL BOLUS VIA INFUSION
25.0000 ug | INTRAVENOUS | Status: DC | PRN
  Filled 2019-04-12: qty 25

## 2019-04-12 MED ORDER — PROPOFOL 1000 MG/100ML IV EMUL
INTRAVENOUS | Status: AC
  Administered 2019-04-12: 03:00:00 15 ug/kg/min via INTRAVENOUS
  Filled 2019-04-12: qty 100

## 2019-04-12 MED ORDER — ENOXAPARIN SODIUM 60 MG/0.6ML ~~LOC~~ SOLN: 0.5000 mg/kg | SUBCUTANEOUS | Status: DC

## 2019-04-12 MED ORDER — SODIUM CHLORIDE 0.9 % IV SOLN
200.0000 mg | Freq: Once | INTRAVENOUS | Status: AC
  Administered 2019-04-12: 200 mg via INTRAVENOUS
  Filled 2019-04-12: qty 40

## 2019-04-12 MED ORDER — CHLORHEXIDINE GLUCONATE 0.12% ORAL RINSE (MEDLINE KIT)
15.0000 mL | Freq: Two times a day (BID) | OROMUCOSAL | Status: DC
  Administered 2019-04-12 – 2019-04-13 (×3): 15 mL via OROMUCOSAL

## 2019-04-12 MED ORDER — HYDRALAZINE HCL 20 MG/ML IJ SOLN
10.0000 mg | Freq: Once | INTRAMUSCULAR | Status: AC
  Administered 2019-04-12: 10 mg via INTRAVENOUS
  Filled 2019-04-12: qty 1

## 2019-04-12 MED ORDER — ASPIRIN 81 MG PO CHEW
81.0000 mg | CHEWABLE_TABLET | Freq: Every day | ORAL | Status: DC
  Administered 2019-04-12 – 2019-04-24 (×13): 81 mg via ORAL
  Filled 2019-04-12 (×13): qty 1

## 2019-04-12 MED ORDER — ISOSORBIDE MONONITRATE ER 60 MG PO TB24: 60.0000 mg | ORAL_TABLET | Freq: Every day | ORAL | Status: DC

## 2019-04-12 MED ORDER — ACETAMINOPHEN ER 650 MG PO TBCR: 650.0000 mg | EXTENDED_RELEASE_TABLET | Freq: Three times a day (TID) | ORAL | Status: DC | PRN

## 2019-04-12 MED ORDER — FOLIC ACID 1 MG PO TABS: 1.0000 mg | ORAL_TABLET | Freq: Every day | ORAL | Status: DC

## 2019-04-12 MED ORDER — SUCCINYLCHOLINE CHLORIDE 20 MG/ML IJ SOLN
150.0000 mg | Freq: Once | INTRAMUSCULAR | Status: AC
  Administered 2019-04-12: 150 mg via INTRAVENOUS

## 2019-04-12 MED ORDER — SODIUM CHLORIDE 0.9 % IV SOLN
100.0000 mg | INTRAVENOUS | Status: AC
  Administered 2019-04-13 – 2019-04-16 (×4): 100 mg via INTRAVENOUS
  Filled 2019-04-12 (×4): qty 20

## 2019-04-12 MED ORDER — ETOMIDATE 2 MG/ML IV SOLN
20.0000 mg | Freq: Once | INTRAVENOUS | Status: AC
  Administered 2019-04-12: 20 mg via INTRAVENOUS

## 2019-04-12 MED ORDER — METOPROLOL TARTRATE 25 MG PO TABS: 25.0000 mg | ORAL_TABLET | Freq: Two times a day (BID) | ORAL | Status: DC

## 2019-04-12 MED ORDER — MIDAZOLAM 50MG/50ML (1MG/ML) PREMIX INFUSION
INTRAVENOUS | Status: AC
  Filled 2019-04-12: qty 50

## 2019-04-12 MED ORDER — INSULIN GLARGINE (1 UNIT DIAL) 300 UNIT/ML ~~LOC~~ SOPN: 60.0000 [IU] | PEN_INJECTOR | Freq: Every day | SUBCUTANEOUS | Status: DC

## 2019-04-12 MED ORDER — INSULIN ASPART 100 UNIT/ML ~~LOC~~ SOLN
0.0000 [IU] | SUBCUTANEOUS | Status: DC
  Administered 2019-04-12: 11 [IU] via SUBCUTANEOUS
  Administered 2019-04-12: 7 [IU] via SUBCUTANEOUS
  Administered 2019-04-12: 21:00:00 11 [IU] via SUBCUTANEOUS
  Administered 2019-04-13: 4 [IU] via SUBCUTANEOUS
  Administered 2019-04-13: 15 [IU] via SUBCUTANEOUS
  Administered 2019-04-13: 4 [IU] via SUBCUTANEOUS
  Administered 2019-04-13: 11 [IU] via SUBCUTANEOUS
  Administered 2019-04-13 – 2019-04-14 (×3): 4 [IU] via SUBCUTANEOUS
  Administered 2019-04-14 (×2): 7 [IU] via SUBCUTANEOUS
  Administered 2019-04-14: 11 [IU] via SUBCUTANEOUS
  Administered 2019-04-15: 12:00:00 15 [IU] via SUBCUTANEOUS
  Administered 2019-04-15: 23:00:00 11 [IU] via SUBCUTANEOUS
  Administered 2019-04-15: 15:00:00 20 [IU] via SUBCUTANEOUS
  Administered 2019-04-15: 20:00:00 8 [IU] via SUBCUTANEOUS
  Administered 2019-04-15: 15 [IU] via SUBCUTANEOUS
  Administered 2019-04-15: 08:00:00 11 [IU] via SUBCUTANEOUS
  Administered 2019-04-16 (×2): 15 [IU] via SUBCUTANEOUS
  Administered 2019-04-16: 11 [IU] via SUBCUTANEOUS
  Administered 2019-04-16: 17:00:00 20 [IU] via SUBCUTANEOUS
  Administered 2019-04-16 (×2): 15 [IU] via SUBCUTANEOUS
  Administered 2019-04-17: 20 [IU] via SUBCUTANEOUS
  Administered 2019-04-17: 20:00:00 15 [IU] via SUBCUTANEOUS
  Administered 2019-04-17: 20 [IU] via SUBCUTANEOUS
  Administered 2019-04-17 (×2): 15 [IU] via SUBCUTANEOUS
  Administered 2019-04-18: 20 [IU] via SUBCUTANEOUS
  Administered 2019-04-18: 15 [IU] via SUBCUTANEOUS
  Administered 2019-04-18: 20 [IU] via SUBCUTANEOUS
  Administered 2019-04-18 (×2): 15 [IU] via SUBCUTANEOUS

## 2019-04-12 MED ORDER — ENOXAPARIN SODIUM 40 MG/0.4ML ~~LOC~~ SOLN
40.0000 mg | Freq: Two times a day (BID) | SUBCUTANEOUS | Status: DC
  Administered 2019-04-12 – 2019-04-14 (×5): 40 mg via SUBCUTANEOUS
  Filled 2019-04-12 (×5): qty 0.4

## 2019-04-12 MED ORDER — ALBUTEROL (5 MG/ML) CONTINUOUS INHALATION SOLN
INHALATION_SOLUTION | RESPIRATORY_TRACT | Status: AC
  Administered 2019-04-12: 02:00:00
  Filled 2019-04-12: qty 20

## 2019-04-12 MED ORDER — DEXAMETHASONE SODIUM PHOSPHATE 10 MG/ML IJ SOLN
6.0000 mg | INTRAMUSCULAR | Status: DC
  Administered 2019-04-12 – 2019-04-17 (×6): 6 mg via INTRAVENOUS
  Filled 2019-04-12 (×6): qty 1

## 2019-04-12 MED ORDER — ASPIRIN 81 MG PO TBEC: 81.0000 mg | DELAYED_RELEASE_TABLET | Freq: Every day | ORAL | Status: DC

## 2019-04-12 MED ORDER — FENTANYL 2500MCG IN NS 250ML (10MCG/ML) PREMIX INFUSION
25.0000 ug/h | INTRAVENOUS | Status: DC
  Administered 2019-04-12: 50 ug/h via INTRAVENOUS

## 2019-04-12 MED ORDER — FENTANYL 2500MCG IN NS 250ML (10MCG/ML) PREMIX INFUSION
0.0000 ug/h | INTRAVENOUS | Status: DC
  Administered 2019-04-12: 50 ug/h via INTRAVENOUS
  Administered 2019-04-13: 100 ug/h via INTRAVENOUS
  Filled 2019-04-12: qty 250

## 2019-04-12 MED ORDER — TRAMADOL HCL 50 MG PO TABS: 50.0000 mg | ORAL_TABLET | Freq: Four times a day (QID) | ORAL | Status: DC | PRN

## 2019-04-12 MED ORDER — MIDAZOLAM BOLUS VIA INFUSION
1.0000 mg | INTRAVENOUS | Status: DC | PRN
  Filled 2019-04-12: qty 2

## 2019-04-12 MED ORDER — ORAL CARE MOUTH RINSE
15.0000 mL | OROMUCOSAL | Status: DC
  Administered 2019-04-12 – 2019-04-13 (×14): 15 mL via OROMUCOSAL

## 2019-04-12 MED ORDER — MORPHINE SULFATE (PF) 4 MG/ML IV SOLN
4.0000 mg | Freq: Once | INTRAVENOUS | Status: AC
  Administered 2019-04-12: 4 mg via INTRAVENOUS

## 2019-04-12 MED ORDER — FUROSEMIDE 40 MG PO TABS: 80.0000 mg | ORAL_TABLET | Freq: Every day | ORAL | Status: DC

## 2019-04-12 MED ORDER — MIDAZOLAM 50MG/50ML (1MG/ML) PREMIX INFUSION
0.0000 mg/h | INTRAVENOUS | Status: DC
  Administered 2019-04-12 – 2019-04-13 (×3): 4 mg/h via INTRAVENOUS
  Filled 2019-04-12 (×3): qty 50

## 2019-04-12 MED ORDER — PANTOPRAZOLE SODIUM 40 MG IV SOLR
40.0000 mg | Freq: Every day | INTRAVENOUS | Status: DC
  Administered 2019-04-12: 40 mg via INTRAVENOUS
  Filled 2019-04-12: qty 40

## 2019-04-12 MED ORDER — ATORVASTATIN CALCIUM 40 MG PO TABS: 80.0000 mg | ORAL_TABLET | Freq: Every day | ORAL | Status: DC

## 2019-04-12 MED ORDER — OMEGA-3 FATTY ACIDS 1000 MG PO CAPS: 1.0000 g | ORAL_CAPSULE | Freq: Every day | ORAL | Status: DC

## 2019-04-12 MED ORDER — CHLORHEXIDINE GLUCONATE CLOTH 2 % EX PADS
6.0000 | MEDICATED_PAD | Freq: Every day | CUTANEOUS | Status: DC
  Administered 2019-04-12 – 2019-04-27 (×16): 6 via TOPICAL

## 2019-04-12 MED ORDER — ACARBOSE 100 MG PO TABS: 100.0000 mg | ORAL_TABLET | Freq: Three times a day (TID) | ORAL | Status: DC

## 2019-04-12 MED ORDER — ENOXAPARIN SODIUM 40 MG/0.4ML ~~LOC~~ SOLN: 40.0000 mg | Freq: Two times a day (BID) | SUBCUTANEOUS | Status: DC

## 2019-04-12 MED ORDER — PRO-STAT SUGAR FREE PO LIQD
30.0000 mL | Freq: Two times a day (BID) | ORAL | Status: DC
  Administered 2019-04-12 – 2019-04-13 (×3): 30 mL
  Filled 2019-04-12 (×2): qty 30

## 2019-04-12 MED ORDER — PROPOFOL 1000 MG/100ML IV EMUL
5.0000 ug/kg/min | INTRAVENOUS | Status: DC
  Administered 2019-04-12: 08:00:00 19.915 ug/kg/min via INTRAVENOUS
  Administered 2019-04-12: 03:00:00 15 ug/kg/min via INTRAVENOUS
  Filled 2019-04-12: qty 100

## 2019-04-12 MED ORDER — ALPRAZOLAM 0.5 MG PO TABS: 0.5000 mg | ORAL_TABLET | Freq: Three times a day (TID) | ORAL | Status: DC | PRN

## 2019-04-12 MED ORDER — FENTANYL 2500MCG IN NS 250ML (10MCG/ML) PREMIX INFUSION
INTRAVENOUS | Status: AC
  Filled 2019-04-12: qty 250

## 2019-04-12 MED ORDER — SODIUM CHLORIDE 0.9 % IV BOLUS
1000.0000 mL | Freq: Once | INTRAVENOUS | Status: AC
  Administered 2019-04-12: 1000 mL via INTRAVENOUS

## 2019-04-12 MED ORDER — INSULIN ASPART 100 UNIT/ML ~~LOC~~ SOLN
0.0000 [IU] | Freq: Three times a day (TID) | SUBCUTANEOUS | Status: DC
  Administered 2019-04-12 (×2): 5 [IU] via SUBCUTANEOUS

## 2019-04-12 MED ORDER — FENTANYL BOLUS VIA INFUSION
50.0000 ug | INTRAVENOUS | Status: DC | PRN
  Administered 2019-04-12: 100 ug via INTRAVENOUS
  Filled 2019-04-12: qty 50

## 2019-04-12 MED ORDER — SUCCINYLCHOLINE CHLORIDE 20 MG/ML IJ SOLN: 100.0000 mg | Freq: Once | INTRAMUSCULAR | Status: DC

## 2019-04-12 MED ORDER — PHENYLEPHRINE HCL-NACL 10-0.9 MG/250ML-% IV SOLN
0.0000 ug/min | INTRAVENOUS | Status: DC
  Administered 2019-04-12: 20 ug/min via INTRAVENOUS
  Administered 2019-04-12 (×2): 25 ug/min via INTRAVENOUS
  Administered 2019-04-18: 100 ug/min via INTRAVENOUS
  Administered 2019-04-19: 35 ug/min via INTRAVENOUS
  Administered 2019-04-19: 07:00:00 60 ug/min via INTRAVENOUS
  Administered 2019-04-19: 100 ug/min via INTRAVENOUS
  Administered 2019-04-19: 20 ug/min via INTRAVENOUS
  Administered 2019-04-19: 40 ug/min via INTRAVENOUS
  Administered 2019-04-19: 30 ug/min via INTRAVENOUS
  Filled 2019-04-12 (×6): qty 250
  Filled 2019-04-12: qty 500
  Filled 2019-04-12: qty 250

## 2019-04-12 MED ORDER — CLOPIDOGREL BISULFATE 75 MG PO TABS
75.0000 mg | ORAL_TABLET | Freq: Every day | ORAL | Status: DC
  Administered 2019-04-12 – 2019-04-23 (×12): 75 mg
  Filled 2019-04-12 (×13): qty 1

## 2019-04-12 MED ORDER — CLOPIDOGREL BISULFATE 75 MG PO TABS: 75.0000 mg | ORAL_TABLET | Freq: Every day | ORAL | Status: DC

## 2019-04-12 MED ORDER — TOCILIZUMAB 400 MG/20ML IV SOLN
800.0000 mg | Freq: Once | INTRAVENOUS | Status: AC
  Administered 2019-04-12: 800 mg via INTRAVENOUS
  Filled 2019-04-12: qty 40

## 2019-04-12 MED ORDER — GABAPENTIN 300 MG PO CAPS: 300.0000 mg | ORAL_CAPSULE | Freq: Three times a day (TID) | ORAL | Status: DC | PRN

## 2019-04-12 MED ORDER — ACETAMINOPHEN 325 MG PO TABS
650.0000 mg | ORAL_TABLET | Freq: Four times a day (QID) | ORAL | Status: DC | PRN
  Administered 2019-04-12 – 2019-05-20 (×25): 650 mg via ORAL
  Filled 2019-04-12 (×24): qty 2

## 2019-04-12 MED ORDER — INSULIN GLARGINE 100 UNIT/ML ~~LOC~~ SOLN
15.0000 [IU] | Freq: Two times a day (BID) | SUBCUTANEOUS | Status: DC
  Administered 2019-04-12 – 2019-04-15 (×7): 15 [IU] via SUBCUTANEOUS
  Filled 2019-04-12 (×8): qty 0.15

## 2019-04-12 MED ORDER — MIDAZOLAM 50MG/50ML (1MG/ML) PREMIX INFUSION
0.5000 mg/h | INTRAVENOUS | Status: DC
  Administered 2019-04-12: 2 mg/h via INTRAVENOUS

## 2019-04-12 MED ORDER — FOLIC ACID 1 MG PO TABS
1.0000 mg | ORAL_TABLET | Freq: Every day | ORAL | Status: DC
  Administered 2019-04-12 – 2019-05-05 (×24): 1 mg
  Filled 2019-04-12 (×25): qty 1

## 2019-04-12 MED ORDER — VITAL HIGH PROTEIN PO LIQD
1000.0000 mL | ORAL | Status: DC
  Administered 2019-04-12 – 2019-04-13 (×2): 1000 mL

## 2019-04-12 MED ORDER — ENOXAPARIN SODIUM 40 MG/0.4ML ~~LOC~~ SOLN
40.0000 mg | SUBCUTANEOUS | Status: DC
  Administered 2019-04-12: 40 mg via SUBCUTANEOUS
  Filled 2019-04-12: qty 0.4

## 2019-04-12 MED ORDER — LOSARTAN POTASSIUM 25 MG PO TABS: 12.5000 mg | ORAL_TABLET | Freq: Every day | ORAL | Status: DC

## 2019-04-12 NOTE — ED Notes (Signed)
Xray at bedside to confirm ET/OG placement

## 2019-04-12 NOTE — ED Notes (Signed)
Pt pulled out ET tube- nurse to room- pt placed on non-re breather- pt breathing on his own- not verbal- RT and DR Tomi Bamberger made aware.

## 2019-04-12 NOTE — ED Notes (Signed)
Date and time results received: 04/12/19 2:47 AM (use smartphrase ".now" to insert current time)  Test: ABG Critical Value: pH- 7.159 PCO2- 72.9 Name of Provider Notified:Dr Zierle-Ghosh Orders Received? Or Actions Taken?: see chart for details

## 2019-04-12 NOTE — Procedures (Signed)
Central Venous Catheter Insertion Procedure Note CORBYN WILDEY [id number] 1950-09-20  Procedure: Insertion of Central Venous Catheter Indications: Assessment of intravascular volume  Procedure Details Consent: Unable to obtain consent because of emergent medical necessity. Time Out: Verified patient identification, verified procedure, site/side was marked, verified correct patient position, special equipment/implants available, medications/allergies/relevent history reviewed, required imaging and test results available.  Performed  Maximum sterile technique was used including antiseptics, cap, gloves, gown, hand hygiene, mask and sheet. Skin prep: Chlorhexidine; local anesthetic administered A antimicrobial bonded/coated triple lumen catheter was placed in the left subclavian vein using the Seldinger technique.  Ultrasound was used to verify the patency of the vein and for real time needle guidance.  Evaluation Blood flow good Complications: No apparent complications Patient did tolerate procedure well. Chest X-ray ordered to verify placement.  CXR: pending.  Simonne Maffucci 04/12/2019, 12:00 PM

## 2019-04-12 NOTE — Progress Notes (Addendum)
eLink Physician-Brief Progress Note Patient Name: Robert [name] DOB: 1951/06/14 MRN: [id number]   Date of Service  04/12/2019  HPI/Events of Note  68 yo male with PMH of COPD, CAD - s/p MI, ischemic cardiomyopathy, GERD, HLD and IDDM. Now with VDRF d/t COVID-19 pneumonia. BP = 73/52 with MAP = 61. Last LVEF = 50-55%.  eICU Interventions  Will order: 1. Bolus with 0.9 NaCl 1 liter IV over 1 hour now.  2. If no response to fluid challenge, Phenylephrine IV infusion. Titrate to MAP >= 65.      Intervention Category Evaluation Type: New Patient Evaluation  Lysle Dingwall 04/12/2019, 6:30 AM

## 2019-04-12 NOTE — ED Notes (Signed)
Pt disconnected from hospital monitor and placed on CArelink monitor and stretcher.

## 2019-04-12 NOTE — H&P (Signed)
TRH H&P    Patient Demographics:    Robert [name], is a 68 y.o. male  MRN: [id number]  DOB - 1951-08-10  Admit Date - 04/11/2019  Referring MD/NP/PA: Tomi Bamberger  Outpatient Primary MD for the patient is Redmond School, MD  Patient coming from: Home  Chief complaint-shortness of breath   HPI:    Robert [name]  is a 68 y.o. male, with past medical history of anxiety, COPD, CAD, GERD, HLD, history of MI, hypertension, hernia, insulin-dependent diabetes mellitus, ischemic cardiomyopathy-last echo December 2019 with a LVEF of 50 to 11% (grade 2 diastolic dysfunction), and rheumatoid arthritis presents with 2 days of gradually worsening shortness of breath.  Patient reports that 2 days ago he started to notice he was short of breath, it was present at rest and with exertion.  It progressively became worse.  He was tested for COVID in the community yesterday, but his result is not back yet.  Today, he felt like he could not breathe at home and called EMS to come into the ED.  Patient reports that he has had an associated fever with a T-max 100.5.  Patient took Tylenol for the fever and reports that that helped.  Patient reports that he has had an associated cough, productive of minimal yellow sputum.  Patient is a smoker, but reports that he quit as soon as he came up to the hospital today.   ED course  Blood cultures ordered Lactic acid below 4 1 L bolus normal saline-patient was not hypotensive so 30 mls per cake protocol was not initiated. Patient was started on cefepime metronidazole and Vanco Chest x-ray was done that showed bilateral pneumonia. Blood work was done that showed a thrombocytopenia with platelets at 102, borderline leukopenia with white blood cell of 4.0, mild hyponatremia with a sodium of 132, hyperglycemia with a glucose of 220 (gap 10, bicarb 22).  EKG was done that showed sinus tachycardia at 118, with a  QTC of 459.  No significant ST-T changes.  Lactic acid was 1.2.  COVID-19 test was positive.  Patient was set up for an admission, but after my initial interview with him, his status deteriorated in the ED.  His saturations dropped to 70% on the nasal cannula, high flow oxygen could not bring the saturations back up.  Patient was intubated and required FiO2 of 100% to bring saturations up to 90.  During intubation patient had pink frothy sputum.  Deep suctioning of ET tube was done.  ABG ordered.  Patient continued to be tachycardic, with rates up into the 140s.  On the monitor it appears sinus tachycardia.  Twelve-lead EKG was ordered, troponin was ordered, ABG was ordered.  I personally spoke with the hospitalist at [facility] to discuss this case.  Patient will be transferred to the ICU at [facility].    Review of systems:    In addition to the HPI above,  Pos Fever-chills, No Headache, No changes with Vision or hearing, No problems swallowing food or Liquids, Positive for Cough or Shortness of Breath,  No Abdominal pain, No Nausea or Vomiting, bowel movements are regular, No Blood in stool or Urine, No dysuria, No new skin rashes or bruises, Positive for myalgias No new weakness, tingling, numbness in any extremity,   All other systems reviewed and are negative.    Past History of the following :    Past Medical History:  Diagnosis Date   Anxiety    COPD (chronic obstructive pulmonary disease) (HCC)    Coronary artery disease    Full dentures    GERD (gastroesophageal reflux disease)    Rolaids as needed   High cholesterol    History of MI (myocardial infarction)    Hypertension    med. dosage increased 04/2016; has been on BP med. x 10 yrs.   Incarcerated umbilical hernia 97/0263   Inguinal hernia 05/2016   bilateral    Insulin dependent diabetes mellitus ([place])    Ischemic cardiomyopathy    a. 03/2015 EF 40-45% by LV gram.   Left leg cellulitis 10/08/2016    Rheumatoid arthritis(714.0)       Past Surgical History:  Procedure Laterality Date   CARDIAC CATHETERIZATION N/A 02/01/2015   Procedure: Left Heart Cath and Coronary Angiography;  Surgeon: Belva Crome, MD;  Location: [place] [facility];  Service: Cardiovascular;  Laterality: N/A;   CARDIAC CATHETERIZATION N/A 03/22/2015   Procedure: Left Heart Cath and Coronary Angiography;  Surgeon: Leonie Man, MD;  Location: [place] [facility];  Service: Cardiovascular;  Laterality: N/A;   CARDIAC CATHETERIZATION  09/05/2002; 03/09/2003; 04/10/2005;06/02/2005; 05/09/2006; 02/09/2007; 03/01/2007   COLONOSCOPY  2008   Dr. Gala Romney: diverticulosis, hyperplastic polyp.   CORONARY ANGIOPLASTY  11/14/2012; 02/01/2015   CORONARY ANGIOPLASTY WITH STENT PLACEMENT  05/16/2012   "1; makes total ~ 4"   CORONARY STENT INTERVENTION N/A 06/06/2017   Procedure: CORONARY STENT INTERVENTION;  Surgeon: Belva Crome, MD;  Location: [place] [facility];  Service: Cardiovascular;  Laterality: N/A;   INSERTION OF MESH Bilateral 05/24/2016   Procedure: INSERTION OF MESH;  Surgeon: Coralie Keens, MD;  Location: [place];  Service: General;  Laterality: Bilateral;   LAPAROSCOPIC CHOLECYSTECTOMY     LAPAROSCOPIC INGUINAL HERNIA WITH UMBILICAL HERNIA Bilateral 05/24/2016   Procedure: BILATERAL LAPAROSCOPIC INGUINAL HERNIA REPAIR WITH MESH AND UMBILICAL HERNIA REPAIR WITH MESH;  Surgeon: Coralie Keens, MD;  Location: [place];  Service: General;  Laterality: Bilateral;   LEFT HEART CATH AND CORONARY ANGIOGRAPHY N/A 06/06/2017   Procedure: LEFT HEART CATH AND CORONARY ANGIOGRAPHY;  Surgeon: Belva Crome, MD;  Location: [place] [facility];  Service: Cardiovascular;  Laterality: N/A;   LEFT HEART CATHETERIZATION WITH CORONARY ANGIOGRAM N/A 12/22/2011   Procedure: LEFT HEART CATHETERIZATION WITH CORONARY ANGIOGRAM;  Surgeon: Troy Sine, MD;  Location: [facility] CATH LAB;  Service:  Cardiovascular;  Laterality: N/A;   LEFT HEART CATHETERIZATION WITH CORONARY ANGIOGRAM Bilateral 05/16/2012   Procedure: LEFT HEART CATHETERIZATION WITH CORONARY ANGIOGRAM;  Surgeon: Lorretta Harp, MD;  Location: [facility] CATH LAB;  Service: Cardiovascular;  Laterality: Bilateral;   LEFT HEART CATHETERIZATION WITH CORONARY ANGIOGRAM N/A 11/14/2012   Procedure: LEFT HEART CATHETERIZATION WITH CORONARY ANGIOGRAM;  Surgeon: Troy Sine, MD;  Location: [facility] CATH LAB;  Service: Cardiovascular;  Laterality: N/A;   LEFT HEART CATHETERIZATION WITH CORONARY ANGIOGRAM N/A 09/01/2013   Procedure: LEFT HEART CATHETERIZATION WITH CORONARY ANGIOGRAM;  Surgeon: Leonie Man, MD;  Location: [facility] CATH LAB;  Service: Cardiovascular;  Laterality: N/A;   LUMBAR LAMINECTOMY  1972; 1973; 1985   x [place]  08/30/2009   No significant ischemia   OPEN REDUCTION INTERNAL FIXATION (ORIF) DISTAL RADIAL FRACTURE Right 12/10/2016   Procedure: OPEN REDUCTION INTERNAL FIXATION (ORIF) DISTAL RADIAL FRACTURE;  Surgeon: Iran Planas, MD;  Location: [place];  Service: Orthopedics;  Laterality: Right;   PERCUTANEOUS CORONARY INTERVENTION-BALLOON ONLY  12/22/2011   Procedure: PERCUTANEOUS CORONARY INTERVENTION-BALLOON ONLY;  Surgeon: Troy Sine, MD;  Location: [facility] CATH LAB;  Service: Cardiovascular;;   PERCUTANEOUS CORONARY INTERVENTION-BALLOON ONLY  11/14/2012   Procedure: PERCUTANEOUS CORONARY INTERVENTION-BALLOON ONLY;  Surgeon: Troy Sine, MD;  Location: [facility] CATH LAB;  Service: Cardiovascular;;   PERCUTANEOUS CORONARY STENT INTERVENTION (PCI-S)  05/16/2012   Procedure: PERCUTANEOUS CORONARY STENT INTERVENTION (PCI-S);  Surgeon: Lorretta Harp, MD;  Location: [facility] CATH LAB;  Service: Cardiovascular;;   PERCUTANEOUS CORONARY STENT INTERVENTION (PCI-S)  09/01/2013   Procedure: PERCUTANEOUS CORONARY STENT INTERVENTION (PCI-S);  Surgeon: Leonie Man, MD;  Location: [facility] CATH LAB;  Service: Cardiovascular;;        Social History:      Social History   Tobacco Use   Smoking status: Current Some Day Smoker    Packs/day: 1.00    Types: Cigarettes    Last attempt to quit: 06/11/2015    Years since quitting: 3.8   Smokeless tobacco: Never Used   Tobacco comment: pt states smoking less than half a pack  Substance Use Topics   Alcohol use: No       Family History :     Family History  Problem Relation Age of Onset   Heart disease Father    Colon cancer Brother        early 52s   Hypertension Mother    Heart disease Mother    Colon cancer Mother        died age 40. late 73s early 88s at diagnosis.    Lung cancer Mother       Home Medications:   Prior to Admission medications   Medication Sig Start Date End Date Taking? Authorizing Provider  acarbose (PRECOSE) 100 MG tablet Take 100 mg by mouth 3 (three) times daily with meals.  03/08/17   [provider]  acetaminophen (TYLENOL) 650 MG CR tablet Take 650 mg by mouth every 8 (eight) hours as needed for pain.    [provider]  ALPRAZolam Duanne Moron) 0.5 MG tablet Take 0.5 mg by mouth 3 (three) times daily as needed for anxiety.     [provider]  AMBULATORY NON FORMULARY MEDICATION Take 600 mg by mouth daily at 12 noon. Medication Name: Dalcetrapib vs placebo Patient taking differently: Take 600 mg by mouth every evening. Medication Name: Dalcetrapib vs placebo: Cholesterol 12/15/16   Jettie Booze, MD  aspirin EC 81 MG EC tablet Take 1 tablet (81 mg total) by mouth daily. 11/15/12   Brett Canales, PA-C  atorvastatin (LIPITOR) 80 MG tablet TAKE 1 TABLET DAILY AT 6 PM. 10/30/18   Croitoru, Mihai, MD  clopidogrel (PLAVIX) 75 MG tablet TAKE (1) TABLET BY MOUTH ONCE DAILY. 02/17/19   Croitoru, Mihai, MD  fish oil-omega-3 fatty acids 1000 MG capsule Take 1 g by mouth daily.     [provider]  folic acid (FOLVITE) 1 MG tablet Take 1 mg by mouth daily.     [provider]   furosemide (LASIX) 40 MG tablet Take 80mg  po bid for 5 days then 80mg  po  daily 02/17/19   Croitoru, Mihai, MD  gabapentin (NEURONTIN) 300 MG capsule Take 300 mg by mouth 3 (three) times daily as needed (for pain).  03/10/17   [provider]  glipiZIDE (GLUCOTROL) 10 MG tablet Take 10 mg by mouth 2 (two) times daily. 02/12/17   [provider]  Insulin Glargine (TOUJEO SOLOSTAR [place]) Inject 80 Units into the skin daily.    [provider]  isosorbide mononitrate (IMDUR) 60 MG 24 hr tablet Take 1 tablet (60 mg total) by mouth daily. 03/25/19   Croitoru, Mihai, MD  losartan (COZAAR) 25 MG tablet Take 0.5 tablets (12.5 mg total) by mouth daily. 06/12/17   Bhagat, Crista Luria, PA  metFORMIN (GLUCOPHAGE) 1000 MG tablet Take 1 tablet (1,000 mg total) by mouth 2 (two) times daily with a meal. Patient taking differently: Take 1,000-1,250 mg by mouth See admin instructions. Take 1250 mg by mouth in the morning and 1000 mg in the evening 03/23/15   Bhagat, [place], PA  methotrexate 50 MG/2ML injection Inject 2 mLs into the skin once a week. Takes every Thursday 12/01/17   [provider]  metoprolol tartrate (LOPRESSOR) 25 MG tablet TAKE 1 TABLET TWICE DAILY. KEEP JULY APPOINTMENT. 02/24/19   Croitoru, Mihai, MD  nitroGLYCERIN (NITROSTAT) 0.4 MG SL tablet Place 1 tablet (0.4 mg total) under the tongue every 5 (five) minutes x 3 doses as needed for chest pain. 04/24/16   Croitoru, Mihai, MD  potassium chloride SA (K-DUR) 20 MEQ tablet 1 TABLET BY MOUTH DAILY. 03/13/19   Croitoru, Mihai, MD  primidone (MYSOLINE) 50 MG tablet Take 100 mg by mouth daily.     [provider]  Tocilizumab 162 MG/0.9ML SOSY Inject 162 mg into the skin every Monday. ACTEMRA    [provider]     Allergies:     Allergies  Allergen Reactions   Ivp Dye [Iodinated Diagnostic Agents] Itching and Rash     Physical Exam:   Vitals  Blood pressure (!) 171/94, pulse (!) 114, temperature  97.9 F (36.6 C), resp. rate (!) 36, height 6' (1.829 m), weight 102.1 kg, SpO2 99 %.  1.  General: Patient sitting up in bed, appears to be distressed, repositioning frequently to try to become comfortable, labored breathing.  2. Psychiatric: Intact insight and judgment  3. Neurologic: Moves all 4 extremities voluntarily, no focal deficits on limited exam, cranial nerves II through XII grossly intact.  4. HEENMT:  Normocephalic atraumatic, sclera are without icterus, nasal cannula in place at initial exam, mucous membranes dry.  5. Respiratory : Labored breathing with use of accessory muscles, diffuse wheezing-no breathing treatment given due to COVID status, tachypneic, no crackles  6. Cardiovascular : Tachycardic rate, no murmurs rubs or gallops auscultated  7. Gastrointestinal:  Abdomen is soft nontender and obese  8. Skin:  Purplish red skin on left lower extremity, patient reports that it is chronic for him       Data Review:    CBC Recent Labs  Lab 04/11/19 2125  WBC 4.0  HGB 14.5  HCT 42.9  PLT 102*  MCV 87.7  MCH 29.7  MCHC 33.8  RDW 14.8  LYMPHSABS 0.3*  MONOABS 0.3  EOSABS 0.0  BASOSABS 0.0   ------------------------------------------------------------------------------------------------------------------  Results for orders placed or performed during the hospital encounter of 04/11/19 (from the past 48 hour(s))  Lactic acid, plasma     Status: None   Collection Time: 04/11/19  9:25 PM  Result Value Ref Range  Lactic Acid, Venous 1.2 0.5 - 1.9 mmol/L    Comment: Performed at [facility], [address], [place], [place] [address]  Comprehensive metabolic panel     Status: Abnormal   Collection Time: 04/11/19  9:25 PM  Result Value Ref Range   Sodium 132 (L) 135 - 145 mmol/L   Potassium 4.2 3.5 - 5.1 mmol/L   Chloride 100 98 - 111 mmol/L   CO2 22 22 - 32 mmol/L   Glucose, Bld 220 (H) 70 - 99 mg/dL   BUN 20 8 - 23 mg/dL   Creatinine, Ser  0.80 0.61 - 1.24 mg/dL   Calcium 8.4 (L) 8.9 - 10.3 mg/dL   Total Protein 6.7 6.5 - 8.1 g/dL   Albumin 3.0 (L) 3.5 - 5.0 g/dL   AST 33 15 - 41 U/L   ALT 33 0 - 44 U/L   Alkaline Phosphatase 66 38 - 126 U/L   Total Bilirubin 0.4 0.3 - 1.2 mg/dL   GFR calc non Af Amer >60 >60 mL/min   GFR calc Af Amer >60 >60 mL/min   Anion gap 10 5 - 15    Comment: Performed at [facility], [address] [place], [place] [address]  CBC WITH DIFFERENTIAL     Status: Abnormal   Collection Time: 04/11/19  9:25 PM  Result Value Ref Range   WBC 4.0 4.0 - 10.5 K/uL   RBC 4.89 4.22 - 5.81 MIL/uL   Hemoglobin 14.5 13.0 - 17.0 g/dL   HCT 42.9 39.0 - 52.0 %   MCV 87.7 80.0 - 100.0 fL   MCH 29.7 26.0 - 34.0 pg   MCHC 33.8 30.0 - 36.0 g/dL   RDW 14.8 11.5 - 15.5 %   Platelets 102 (L) 150 - 400 K/uL    Comment: Immature Platelet Fraction may be clinically indicated, consider ordering this additional test [id number]    nRBC 0.0 0.0 - 0.2 %   Neutrophils Relative % 86 %   Neutro Abs 3.5 1.7 - 7.7 K/uL   Lymphocytes Relative 8 %   Lymphs Abs 0.3 (L) 0.7 - 4.0 K/uL   Monocytes Relative 6 %   Monocytes Absolute 0.3 0.1 - 1.0 K/uL   Eosinophils Relative 0 %   Eosinophils Absolute 0.0 0.0 - 0.5 K/uL   Basophils Relative 0 %   Basophils Absolute 0.0 0.0 - 0.1 K/uL   Immature Granulocytes 0 %   Abs Immature Granulocytes 0.01 0.00 - 0.07 K/uL    Comment: Performed at [facility], [address] [place], [place] [address]  APTT     Status: None   Collection Time: 04/11/19  9:25 PM  Result Value Ref Range   aPTT 32 24 - 36 seconds    Comment: Performed at [facility], [address], [place], [place] [address]  Protime-INR     Status: None   Collection Time: 04/11/19  9:25 PM  Result Value Ref Range   Prothrombin Time 14.5 11.4 - 15.2 seconds   INR 1.1 0.8 - 1.2    Comment: (NOTE) INR goal varies based on device and disease states. Performed at [facility], [address] [place], [place] [address]     Urinalysis, Routine w reflex microscopic     Status: Abnormal   Collection Time: 04/11/19  9:25 PM  Result Value Ref Range   Color, Urine YELLOW YELLOW   APPearance CLEAR CLEAR   Specific Gravity, Urine 1.025 1.005 - 1.030   pH 5.0 5.0 -  8.0   Glucose, UA 50 (A) NEGATIVE mg/dL   Hgb urine dipstick SMALL (A) NEGATIVE   Bilirubin Urine NEGATIVE NEGATIVE   Ketones, ur 5 (A) NEGATIVE mg/dL   Protein, ur >=300 (A) NEGATIVE mg/dL   Nitrite NEGATIVE NEGATIVE   Leukocytes,Ua NEGATIVE NEGATIVE   RBC / HPF 6-10 0 - 5 RBC/hpf   WBC, UA 0-5 0 - 5 WBC/hpf   Bacteria, UA NONE SEEN NONE SEEN   Squamous Epithelial / LPF 0-5 0 - 5   Granular Casts, UA PRESENT     Comment: Performed at [facility], [address], [place], [place] [address]  SARS Coronavirus 2 [facility] order, Performed in [facility] hospital lab) Nasopharyngeal Nasopharyngeal Swab     Status: Abnormal   Collection Time: 04/11/19  9:50 PM   Specimen: Nasopharyngeal Swab  Result Value Ref Range   SARS Coronavirus 2 POSITIVE (A) NEGATIVE    Comment: RESULT CALLED TO, READ BACK BY AND VERIFIED WITH: TURNER,C. AT 2356 ON 04/11/2019 (NOTE) If result is NEGATIVE SARS-CoV-2 target nucleic acids are NOT DETECTED. The SARS-CoV-2 RNA is generally detectable in upper and lower  respiratory specimens during the acute phase of infection. The lowest  concentration of SARS-CoV-2 viral copies this assay can detect is 250  copies / mL. A negative result does not preclude SARS-CoV-2 infection  and should not be used as the sole basis for treatment or other  patient management decisions.  A negative result may occur with  improper specimen collection / handling, submission of specimen other  than nasopharyngeal swab, presence of viral mutation(s) within the  areas targeted by this assay, and inadequate number of viral copies  (<250 copies / mL). A negative result must be combined with clinical  observations, patient history, and  epidemiological information. If result is POSITIVE SARS-CoV-2 target nucleic acids are DETECTED. The S ARS-CoV-2 RNA is generally detectable in upper and lower  respiratory specimens during the acute phase of infection.  Positive  results are indicative of active infection with SARS-CoV-2.  Clinical  correlation with patient history and other diagnostic information is  necessary to determine patient infection status.  Positive results do  not rule out bacterial infection or co-infection with other viruses. If result is PRESUMPTIVE POSTIVE SARS-CoV-2 nucleic acids MAY BE PRESENT.   A presumptive positive result was obtained on the submitted specimen  and confirmed on repeat testing.  While 2019 novel coronavirus  (SARS-CoV-2) nucleic acids may be present in the submitted sample  additional confirmatory testing may be necessary for epidemiological  and / or clinical management purposes  to differentiate between  SARS-CoV-2 and other Sarbecovirus currently known to infect humans.  If clinically indicated additional testing with an alternate test  methodology [phone]) is adv ised. The SARS-CoV-2 RNA is generally  detectable in upper and lower respiratory specimens during the acute  phase of infection. The expected result is Negative. Fact Sheet for Patients:  StrictlyIdeas.no Fact Sheet for Healthcare Providers: BankingDealers.co.za This test is not yet approved or cleared by the [place] FDA and has been authorized for detection and/or diagnosis of SARS-CoV-2 by FDA under an Emergency Use Authorization (EUA).  This EUA will remain in effect (meaning this test can be used) for the duration of the COVID-19 declaration under Section 564(b)(1) of the Act, 21 U.S.C. section 360bbb-3(b)(1), unless the authorization is terminated or revoked sooner. Performed at [facility], [address], [place],  [address]     Chemistries   Recent Labs  Lab 04/11/19 2125  NA 132*  K 4.2  CL 100  CO2 22  GLUCOSE 220*  BUN 20  CREATININE 0.80  CALCIUM 8.4*  AST 33  ALT 33  ALKPHOS 66  BILITOT 0.4   ------------------------------------------------------------------------------------------------------------------  ------------------------------------------------------------------------------------------------------------------ GFR: Estimated Creatinine Clearance: 113.8 mL/min (by C-G formula based on SCr of 0.8 mg/dL). Liver Function Tests: Recent Labs  Lab 04/11/19 2125  AST 33  ALT 33  ALKPHOS 66  BILITOT 0.4  PROT 6.7  ALBUMIN 3.0*   No results for input(s): LIPASE, AMYLASE in the last 168 hours. No results for input(s): AMMONIA in the last 168 hours. Coagulation Profile: Recent Labs  Lab 04/11/19 2125  INR 1.1   Cardiac Enzymes: No results for input(s): CKTOTAL, CKMB, CKMBINDEX, TROPONINI in the last 168 hours. BNP (last 3 results) No results for input(s): PROBNP in the last 8760 hours. HbA1C: No results for input(s): HGBA1C in the last 72 hours. CBG: No results for input(s): GLUCAP in the last 168 hours. Lipid Profile: No results for input(s): CHOL, HDL, LDLCALC, TRIG, CHOLHDL, LDLDIRECT in the last 72 hours. Thyroid Function Tests: No results for input(s): TSH, T4TOTAL, FREET4, T3FREE, THYROIDAB in the last 72 hours. Anemia Panel: No results for input(s): VITAMINB12, FOLATE, FERRITIN, TIBC, IRON, RETICCTPCT in the last 72 hours.  --------------------------------------------------------------------------------------------------------------- Urine analysis:    Component Value Date/Time   COLORURINE YELLOW 04/11/2019 2125   APPEARANCEUR CLEAR 04/11/2019 2125   LABSPEC 1.025 04/11/2019 2125   PHURINE 5.0 04/11/2019 2125   GLUCOSEU 50 (A) 04/11/2019 2125   HGBUR SMALL (A) 04/11/2019 2125   BILIRUBINUR NEGATIVE 04/11/2019 2125   KETONESUR 5 (A) 04/11/2019 2125   PROTEINUR >=300 (A)  04/11/2019 2125   UROBILINOGEN 0.2 12/22/2011 2130   NITRITE NEGATIVE 04/11/2019 2125   LEUKOCYTESUR NEGATIVE 04/11/2019 2125      Imaging Results:    Dg Chest Port 1 View  Result Date: 04/11/2019 CLINICAL DATA:  Dry cough and shortness of breath over the last 2 days. EXAM: PORTABLE CHEST 1 VIEW COMPARISON:  08/26/2018 FINDINGS: Bilateral bronchopneumonia, worse on the left than the right. Left disease is more pronounced in the mid and lower lung and right lung disease is more pronounced in the lower lung. Small amount of effusion on the left. This could be a pattern of bacterial or viral pneumonia. Heart size is normal with left ventricular prominence. There are coronary artery stents. Aortic atherosclerosis is present. IMPRESSION: Bilateral bronchopneumonia left worse than right. This could be due to bacterial or viral pneumonia. Electronically Signed   By: Nelson Chimes M.D.   On: 04/11/2019 21:55    My personal review of EKG: Rhythm NSR, Rate 118/min, QTc 459,no Acute ST changes  At time of my subsequent exam, heart rate monitor showed a rate of 141 that appeared to be sinus tachycardia.   Assessment & Plan:    Active Problems:   COVID-19 virus detected   1. Acute respiratory failure secondary to bilateral pneumonia secondary to COVID-19-present on admission 1. Patient intubated requiring FiO2 100% to maintain saturations at 90. 2. Pneumonia being treated as below 3. ABG post intubation shows a PCO2 in the 70s and a pH of 7.1-patient getting 15 mg of albuterol through ventilation system.  Repeat ABG in 30 minutes.  Patient currently satting at 100% on PEEP of 6, currently tapering down FiO2 4. Chest x-ray for tube placement pending 2. Sepsis secondary to COVID-19 and bilateral pneumonia -present on admission 1. Patient treated with broad-spectrum antibiotics  in the ER including metronidazole cefepime and vancomycin 2. Blood cultures pending 3. 1 L fluid bolus given 4. Patient meet  sepsis criteria with a fever of 100.5 and tachycardia of 118 on initial presentation, patient is borderline leukopenic with a white blood cell count of 4.0 5. Will defer to treatment team at [facility] regarding gram does of year and Actemra treatment. 6. Clotting studies ordered as part of the assessment for COVID-19 3. Sinus tachycardia 1. Likely secondary to acute respiratory failure and will likely correct while on vent. EKG and troponin ordered to assess for ACS. 4. Thrombocytopenia 1. Chronic 7 months ago platelets were 118.  No signs or symptoms of active bleeding.  Continue to monitor with daily CBC. 5. Pseudohyponatremia 1. Correction for hyperglycemia equals a sodium of 134 2. Continue to monitor with daily CMP 6. Hyperglycemia 1. In the setting of type 2 diabetes 2. Oral hypoglycemics held 3. Long-acting insulin regimen from home ordered -patient takes 80 units of insulin daily at home, since he will be n.p.o. on the ventilator we will reduce his long-acting insulin to 60 units. 4. Sliding scale coverage ordered    DVT Prophylaxis-   Lovenox - SCDs   AM Labs Ordered, also please review Full Orders  Family Communication: Admission, patients condition and plan of care including tests being ordered have been discussed with the patient and he verbalized understanding and agrees with the plan and Code Status.  Code Status: Full  Admission status: Inpatient: Based on patients clinical presentation and evaluation of above clinical data, I have made determination that patient meets Inpatient criteria at this time.  Time spent in minutes: 65 minutes   Rolla Plate M.D on 04/12/2019 at 2:30 AM

## 2019-04-12 NOTE — Progress Notes (Signed)
Talked with patient point of contact, Daughter [name]. Update given and questions answered, very appreciative of care.

## 2019-04-12 NOTE — ED Notes (Signed)
ED TO INPATIENT HANDOFF REPORT  ED Nurse Name and Phone #: Antony Blackbird RN [phone]  S Name/Age/Gender Robert [name] 68 y.o. male Room/Bed: APA02/APA02  Code Status   Code Status: Prior  Home/SNF/Other Home Patient oriented to: self,place, time, situation upon arrival- pt intubated now Is this baseline? Yes   Triage Complete: Triage complete  Chief Complaint sob  Triage Note Pt in by [facility] for sob. Pt states he had a covid 19 test yesterday but does not have results.    Allergies Allergies  Allergen Reactions  . Ivp Dye [Iodinated Diagnostic Agents] Itching and Rash    Level of Care/Admitting Diagnosis ED Disposition    ED Disposition Condition [facility] Area: [place] [[id number]]  Level of Care: ICU [6]  Covid Evaluation: Confirmed COVID Positive  Diagnosis: COVID-19 virus detected [[id number]]  Admitting Physician: Rolla Plate [[id number]]  Attending Physician: Rolla Plate [[id number]]  Estimated length of stay: 3 - 4 days  Certification:: I certify this patient will need inpatient services for at least 2 midnights  PT Class (Do Not Modify): Inpatient [101]  PT Acc Code (Do Not Modify): Private [1]       B Medical/Surgery History Past Medical History:  Diagnosis Date  . Anxiety   . COPD (chronic obstructive pulmonary disease) ([place])   . Coronary artery disease   . Full dentures   . GERD (gastroesophageal reflux disease)    Rolaids as needed  . High cholesterol   . History of MI (myocardial infarction)   . Hypertension    med. dosage increased 04/2016; has been on BP med. x 10 yrs.  . Incarcerated umbilical hernia 04/6760  . Inguinal hernia 05/2016   bilateral   . Insulin dependent diabetes mellitus ([place])   . Ischemic cardiomyopathy    a. 03/2015 EF 40-45% by LV gram.  . Left leg cellulitis 10/08/2016  . Rheumatoid arthritis(714.0)    Past Surgical History:  Procedure Laterality Date  . CARDIAC  CATHETERIZATION N/A 02/01/2015   Procedure: Left Heart Cath and Coronary Angiography;  Surgeon: Belva Crome, MD;  Location: [facility] [facility];  Service: Cardiovascular;  Laterality: N/A;  . CARDIAC CATHETERIZATION N/A 03/22/2015   Procedure: Left Heart Cath and Coronary Angiography;  Surgeon: Leonie Man, MD;  Location: [place];  Service: Cardiovascular;  Laterality: N/A;  . CARDIAC CATHETERIZATION  09/05/2002; 03/09/2003; 04/10/2005;06/02/2005; 05/09/2006; 02/09/2007; 03/01/2007  . COLONOSCOPY  2008   Dr. Gala Romney: diverticulosis, hyperplastic polyp.  . CORONARY ANGIOPLASTY  11/14/2012; 02/01/2015  . CORONARY ANGIOPLASTY WITH STENT PLACEMENT  05/16/2012   "1; makes total ~ 4"  . CORONARY STENT INTERVENTION N/A 06/06/2017   Procedure: CORONARY STENT INTERVENTION;  Surgeon: Belva Crome, MD;  Location: [facility];  Service: Cardiovascular;  Laterality: N/A;  . INSERTION OF MESH Bilateral 05/24/2016   Procedure: INSERTION OF MESH;  Surgeon: Coralie Keens, MD;  Location: [facility];  Service: General;  Laterality: Bilateral;  . LAPAROSCOPIC CHOLECYSTECTOMY    . LAPAROSCOPIC INGUINAL HERNIA WITH UMBILICAL HERNIA Bilateral 05/24/2016   Procedure: BILATERAL LAPAROSCOPIC INGUINAL HERNIA REPAIR WITH MESH AND UMBILICAL HERNIA REPAIR WITH MESH;  Surgeon: Coralie Keens, MD;  Location: [place];  Service: General;  Laterality: Bilateral;  . LEFT HEART CATH AND CORONARY ANGIOGRAPHY N/A 06/06/2017   Procedure: LEFT HEART CATH AND CORONARY ANGIOGRAPHY;  Surgeon: Belva Crome, MD;  Location: [facility];  Service:  Cardiovascular;  Laterality: N/A;  . LEFT HEART CATHETERIZATION WITH CORONARY ANGIOGRAM N/A 12/22/2011   Procedure: LEFT HEART CATHETERIZATION WITH CORONARY ANGIOGRAM;  Surgeon: Troy Sine, MD;  Location: [facility] CATH LAB;  Service: Cardiovascular;  Laterality: N/A;  . LEFT HEART CATHETERIZATION WITH CORONARY ANGIOGRAM Bilateral 05/16/2012    Procedure: LEFT HEART CATHETERIZATION WITH CORONARY ANGIOGRAM;  Surgeon: Lorretta Harp, MD;  Location: [facility] CATH LAB;  Service: Cardiovascular;  Laterality: Bilateral;  . LEFT HEART CATHETERIZATION WITH CORONARY ANGIOGRAM N/A 11/14/2012   Procedure: LEFT HEART CATHETERIZATION WITH CORONARY ANGIOGRAM;  Surgeon: Troy Sine, MD;  Location: [facility] CATH LAB;  Service: Cardiovascular;  Laterality: N/A;  . LEFT HEART CATHETERIZATION WITH CORONARY ANGIOGRAM N/A 09/01/2013   Procedure: LEFT HEART CATHETERIZATION WITH CORONARY ANGIOGRAM;  Surgeon: Leonie Man, MD;  Location: [facility] CATH LAB;  Service: Cardiovascular;  Laterality: N/A;  . [place]; 1973; 1985   x 3  . NM MYOCAR PERF WALL MOTION  08/30/2009   No significant ischemia  . OPEN REDUCTION INTERNAL FIXATION (ORIF) DISTAL RADIAL FRACTURE Right 12/10/2016   Procedure: OPEN REDUCTION INTERNAL FIXATION (ORIF) DISTAL RADIAL FRACTURE;  Surgeon: Iran Planas, MD;  Location: [place];  Service: Orthopedics;  Laterality: Right;  . PERCUTANEOUS CORONARY INTERVENTION-BALLOON ONLY  12/22/2011   Procedure: PERCUTANEOUS CORONARY INTERVENTION-BALLOON ONLY;  Surgeon: Troy Sine, MD;  Location: [facility] CATH LAB;  Service: Cardiovascular;;  . PERCUTANEOUS CORONARY INTERVENTION-BALLOON ONLY  11/14/2012   Procedure: PERCUTANEOUS CORONARY INTERVENTION-BALLOON ONLY;  Surgeon: Troy Sine, MD;  Location: [facility] CATH LAB;  Service: Cardiovascular;;  . PERCUTANEOUS CORONARY STENT INTERVENTION (PCI-S)  05/16/2012   Procedure: PERCUTANEOUS CORONARY STENT INTERVENTION (PCI-S);  Surgeon: Lorretta Harp, MD;  Location: [facility] CATH LAB;  Service: Cardiovascular;;  . PERCUTANEOUS CORONARY STENT INTERVENTION (PCI-S)  09/01/2013   Procedure: PERCUTANEOUS CORONARY STENT INTERVENTION (PCI-S);  Surgeon: Leonie Man, MD;  Location: [facility] CATH LAB;  Service: Cardiovascular;;     A IV Location/Drains/Wounds Patient Lines/Drains/Airways Status   Active Line/Drains/Airways    Name:    Placement date:   Placement time:   Site:   Days:   Peripheral IV 04/12/19 Left Hand   04/12/19    0206    Hand   less than 1   Peripheral IV 04/12/19 Right;Posterior Forearm   04/12/19    0330    Forearm   less than 1   Post Cath / Sheath 03/22/15 Right Radial   03/22/15    1900    Radial   1482   NG/OG Tube Orogastric 16 Fr. Center mouth Xray;Aucultation Documented cm marking at nare/ corner of mouth 25 cm   04/12/19    0400    Center mouth   less than 1   Urethral Catheter Reniya Mcclees E, RN Temperature probe 16 Fr.   04/12/19    0225    Temperature probe   less than 1   Airway 7.5 mm   04/12/19    0206     less than 1   Incision (Closed) 12/10/16 Arm Right   12/10/16    0909     853   Incision - 3 Ports Abdomen 1: Umbilicus 2: Mid 3: Lower   05/24/16    1141     1053   Wound / Incision (Open or Dehisced) 12/09/16 Laceration Head Left;Other (Comment) Left upper scalp deep abrasion    12/09/16    1700    Head   854   Wound /  Incision (Open or Dehisced) 12/09/16 Laceration Knee Right abrasion   12/09/16    2055    Knee   854   Wound / Incision (Open or Dehisced) 12/09/16 Laceration Knee Left abrasion   12/09/16    2055    Knee   854   Wound / Incision (Open or Dehisced) 12/09/16 Laceration Toe (Comment  which one) Left abrasion   12/09/16    2059    Toe (Comment  which one)   854   Wound / Incision (Open or Dehisced) 03/03/17 Non-pressure wound;Other (Comment) Leg Left;Lower Wound to anterior and posterior lower leg.   03/03/17    1950    Leg   770          Intake/Output Last 24 hours  Intake/Output Summary (Last 24 hours) at 04/12/2019 0420 Last data filed at 04/12/2019 0403 Gross per 24 hour  Intake 1083.1 ml  Output 620 ml  Net 463.1 ml    Labs/Imaging Results for orders placed or performed during the hospital encounter of 04/11/19 (from the past 48 hour(s))  Lactic acid, plasma     Status: None   Collection Time: 04/11/19  9:25 PM  Result Value Ref Range   Lactic Acid, Venous 1.2 0.5  - 1.9 mmol/L    Comment: Performed at [facility], [address], [place], [place] [address]  Comprehensive metabolic panel     Status: Abnormal   Collection Time: 04/11/19  9:25 PM  Result Value Ref Range   Sodium 132 (L) 135 - 145 mmol/L   Potassium 4.2 3.5 - 5.1 mmol/L   Chloride 100 98 - 111 mmol/L   CO2 22 22 - 32 mmol/L   Glucose, Bld 220 (H) 70 - 99 mg/dL   BUN 20 8 - 23 mg/dL   Creatinine, Ser 0.80 0.61 - 1.24 mg/dL   Calcium 8.4 (L) 8.9 - 10.3 mg/dL   Total Protein 6.7 6.5 - 8.1 g/dL   Albumin 3.0 (L) 3.5 - 5.0 g/dL   AST 33 15 - 41 U/L   ALT 33 0 - 44 U/L   Alkaline Phosphatase 66 38 - 126 U/L   Total Bilirubin 0.4 0.3 - 1.2 mg/dL   GFR calc non Af Amer >60 >60 mL/min   GFR calc Af Amer >60 >60 mL/min   Anion gap 10 5 - 15    Comment: Performed at [facility], [address], [place],  [address]  CBC WITH DIFFERENTIAL     Status: Abnormal   Collection Time: 04/11/19  9:25 PM  Result Value Ref Range   WBC 4.0 4.0 - 10.5 K/uL   RBC 4.89 4.22 - 5.81 MIL/uL   Hemoglobin 14.5 13.0 - 17.0 g/dL   HCT 42.9 39.0 - 52.0 %   MCV 87.7 80.0 - 100.0 fL   MCH 29.7 26.0 - 34.0 pg   MCHC 33.8 30.0 - 36.0 g/dL   RDW 14.8 11.5 - 15.5 %   Platelets 102 (L) 150 - 400 K/uL    Comment: Immature Platelet Fraction may be clinically indicated, consider ordering this additional test [id number]    nRBC 0.0 0.0 - 0.2 %   Neutrophils Relative % 86 %   Neutro Abs 3.5 1.7 - 7.7 K/uL   Lymphocytes Relative 8 %   Lymphs Abs 0.3 (L) 0.7 - 4.0 K/uL   Monocytes Relative 6 %   Monocytes Absolute 0.3 0.1 - 1.0 K/uL   Eosinophils Relative 0 %   Eosinophils Absolute 0.0  0.0 - 0.5 K/uL   Basophils Relative 0 %   Basophils Absolute 0.0 0.0 - 0.1 K/uL   Immature Granulocytes 0 %   Abs Immature Granulocytes 0.01 0.00 - 0.07 K/uL    Comment: Performed at [facility], [address] [place], [place] [address]  APTT     Status: None   Collection Time: 04/11/19  9:25 PM  Result Value Ref  Range   aPTT 32 24 - 36 seconds    Comment: Performed at [facility], [address], [place], [place] [address]  Protime-INR     Status: None   Collection Time: 04/11/19  9:25 PM  Result Value Ref Range   Prothrombin Time 14.5 11.4 - 15.2 seconds   INR 1.1 0.8 - 1.2    Comment: (NOTE) INR goal varies based on device and disease states. Performed at [facility], [address] [place], [place] [address]   Urinalysis, Routine w reflex microscopic     Status: Abnormal   Collection Time: 04/11/19  9:25 PM  Result Value Ref Range   Color, Urine YELLOW YELLOW   APPearance CLEAR CLEAR   Specific Gravity, Urine 1.025 1.005 - 1.030   pH 5.0 5.0 - 8.0   Glucose, UA 50 (A) NEGATIVE mg/dL   Hgb urine dipstick SMALL (A) NEGATIVE   Bilirubin Urine NEGATIVE NEGATIVE   Ketones, ur 5 (A) NEGATIVE mg/dL   Protein, ur >=300 (A) NEGATIVE mg/dL   Nitrite NEGATIVE NEGATIVE   Leukocytes,Ua NEGATIVE NEGATIVE   RBC / HPF 6-10 0 - 5 RBC/hpf   WBC, UA 0-5 0 - 5 WBC/hpf   Bacteria, UA NONE SEEN NONE SEEN   Squamous Epithelial / LPF 0-5 0 - 5   Granular Casts, UA PRESENT     Comment: Performed at [facility], [address] [place], [place] [address]  SARS Coronavirus 2 [facility] order, Performed in [facility] hospital lab) Nasopharyngeal Nasopharyngeal Swab     Status: Abnormal   Collection Time: 04/11/19  9:50 PM   Specimen: Nasopharyngeal Swab  Result Value Ref Range   SARS Coronavirus 2 POSITIVE (A) NEGATIVE    Comment: RESULT CALLED TO, READ BACK BY AND VERIFIED WITH: TURNER,C. AT 2356 ON 04/11/2019 (NOTE) If result is NEGATIVE SARS-CoV-2 target nucleic acids are NOT DETECTED. The SARS-CoV-2 RNA is generally detectable in upper and lower  respiratory specimens during the acute phase of infection. The lowest  concentration of SARS-CoV-2 viral copies this assay can detect is 250  copies / mL. A negative result does not preclude SARS-CoV-2 infection  and should not be used as the sole basis  for treatment or other  patient management decisions.  A negative result may occur with  improper specimen collection / handling, submission of specimen other  than nasopharyngeal swab, presence of viral mutation(s) within the  areas targeted by this assay, and inadequate number of viral copies  (<250 copies / mL). A negative result must be combined with clinical  observations, patient history, and epidemiological information. If result is POSITIVE SARS-CoV-2 target nucleic acids are DETECTED. The S ARS-CoV-2 RNA is generally detectable in upper and lower  respiratory specimens during the acute phase of infection.  Positive  results are indicative of active infection with SARS-CoV-2.  Clinical  correlation with patient history and other diagnostic information is  necessary to determine patient infection status.  Positive results do  not rule out bacterial infection or co-infection with other viruses. If result is PRESUMPTIVE POSTIVE SARS-CoV-2 nucleic acids  MAY BE PRESENT.   A presumptive positive result was obtained on the submitted specimen  and confirmed on repeat testing.  While 2019 novel coronavirus  (SARS-CoV-2) nucleic acids may be present in the submitted sample  additional confirmatory testing may be necessary for epidemiological  and / or clinical management purposes  to differentiate between  SARS-CoV-2 and other Sarbecovirus currently known to infect humans.  If clinically indicated additional testing with an alternate test  methodology [phone]) is adv ised. The SARS-CoV-2 RNA is generally  detectable in upper and lower respiratory specimens during the acute  phase of infection. The expected result is Negative. Fact Sheet for Patients:  StrictlyIdeas.no Fact Sheet for Healthcare Providers: BankingDealers.co.za This test is not yet approved or cleared by the [place] FDA and has been authorized for detection and/or  diagnosis of SARS-CoV-2 by FDA under an Emergency Use Authorization (EUA).  This EUA will remain in effect (meaning this test can be used) for the duration of the COVID-19 declaration under Section 564(b)(1) of the Act, 21 U.S.C. section 360bbb-3(b)(1), unless the authorization is terminated or revoked sooner. Performed at [facility], [address], [place], [place] [address]   Blood gas, arterial     Status: Abnormal   Collection Time: 04/12/19  2:16 AM  Result Value Ref Range   FIO2 100.00    VT 620 mL   pH, Arterial 7.159 (LL) 7.350 - 7.450    Comment: CRITICAL RESULT CALLED TO, READ BACK BY AND VERIFIED WITH: TURNER,C @ 0245 ON 8.1.20 BY LBB    pCO2 arterial 72.9 (HH) 32.0 - 48.0 mmHg    Comment: CRITICAL RESULT CALLED TO, READ BACK BY AND VERIFIED WITH: TURNER,C @ 0245 ON 8.1.20 BY LBB    pO2, Arterial 99.8 83.0 - 108.0 mmHg   Bicarbonate 19.4 (L) 20.0 - 28.0 mmol/L   Acid-base deficit 2.8 (H) 0.0 - 2.0 mmol/L   O2 Saturation 94.2 %   Patient temperature 36.8    Allens test (pass/fail) BRACHIAL ARTERY (A) PASS    Comment: Performed at [facility], [address], [place], [place] [address]  Troponin I (High Sensitivity)     Status: Abnormal   Collection Time: 04/12/19  2:49 AM  Result Value Ref Range   Troponin I (High Sensitivity) 34 (H) <18 ng/L    Comment: (NOTE) Elevated high sensitivity troponin I (hsTnI) values and significant  changes across serial measurements may suggest ACS but many other  chronic and acute conditions are known to elevate hsTnI results.  Refer to the "Links" section for chest pain algorithms and additional  guidance. Performed at [facility], [address], [place], [place] [address]   Triglycerides     Status: Abnormal   Collection Time: 04/12/19  2:49 AM  Result Value Ref Range   Triglycerides 184 (H) <150 mg/dL    Comment: Performed at [facility], [address], [place], [place] [address]   Dg Chest Portable 1 View  Result Date:  04/12/2019 CLINICAL DATA:  Post intubation EXAM: PORTABLE CHEST 1 VIEW COMPARISON:  April 11, 2019 FINDINGS: The endotracheal tube terminates approximately 4 cm above the carina. Bilateral multifocal airspace opacities are again noted. There is no pneumothorax. No large pleural effusion. Heart size is unchanged from prior. Aortic calcifications are again noted. IMPRESSION: 1. Endotracheal tube as above. 2. Multifocal airspace opacities consistent with a history of viral pneumonia. Electronically Signed   By: Constance Holster M.D.   On: 04/12/2019 02:45   Dg Chest  Port 1 View  Result Date: 04/11/2019 CLINICAL DATA:  Dry cough and shortness of breath over the last 2 days. EXAM: PORTABLE CHEST 1 VIEW COMPARISON:  08/26/2018 FINDINGS: Bilateral bronchopneumonia, worse on the left than the right. Left disease is more pronounced in the mid and lower lung and right lung disease is more pronounced in the lower lung. Small amount of effusion on the left. This could be a pattern of bacterial or viral pneumonia. Heart size is normal with left ventricular prominence. There are coronary artery stents. Aortic atherosclerosis is present. IMPRESSION: Bilateral bronchopneumonia left worse than right. This could be due to bacterial or viral pneumonia. Electronically Signed   By: Nelson Chimes M.D.   On: 04/11/2019 21:55    Pending Labs Unresulted Labs (From admission, onward)    Start     Ordered   04/14/19 0500  CBC  Every Mon-Wed-Fri (0500),   R     04/11/19 2253   04/14/19 3335  Basic metabolic panel  Every Mon-Wed-Fri (0500),   R     04/11/19 2253   04/12/19 0303  Hemoglobin A1c  Once,   STAT    Comments: To assess prior glycemic control    04/12/19 0303   04/12/19 0221  Triglycerides  (propofol (DIPRIVAN) infusion)  Every 72 hours,   R    Comments: while on propofol (DIPRIVAN)    04/12/19 0221   04/11/19 2125  Blood Culture (routine x 2)  BLOOD CULTURE X 2,   STAT     04/11/19 2125   04/11/19 2125  Urine  culture  ONCE - STAT,   STAT     04/11/19 2125   Signed and Held  CBC  (enoxaparin (LOVENOX)    CrCl >/= 30 ml/min)  Once,   R    Comments: Baseline for enoxaparin therapy IF NOT ALREADY DRAWN.  Notify MD if PLT < 100 K.    Signed and Held   Signed and Held  Creatinine, serum  (enoxaparin (LOVENOX)    CrCl >/= 30 ml/min)  Once,   R    Comments: Baseline for enoxaparin therapy IF NOT ALREADY DRAWN.    Signed and Held   Signed and Held  Creatinine, serum  (enoxaparin (LOVENOX)    CrCl >/= 30 ml/min)  Weekly,   R    Comments: while on enoxaparin therapy    Signed and Held   Signed and Held  Blood gas, arterial  Once,   R     Signed and Held   Signed and Held  Ferritin  Once,   R     Signed and Held   Signed and [employer]  Once,   R     Signed and Held   Signed and Held  Interleukin-6, Plasma  Once,   R     Signed and Held   Signed and Held  Lactate dehydrogenase  Once,   R     Signed and Held   Visual merchandiser and Held  Sedimentation rate  Once,   R     Signed and Held   Signed and Held  Procalcitonin  Once,   R     Signed and Held   Signed and Held  C-reactive protein  Daily,   R     Signed and Held   Signed and Held  CBC with Differential/Platelet  Daily,   R     Signed and Held   Signed and Held  Magnesium  Daily,  R     Signed and Held   Signed and Held  Comprehensive metabolic panel  Daily,   R     Signed and Held   Signed and Held  Fibrinogen  Once,   R     Signed and Held          Vitals/Pain Today's Vitals   04/12/19 0340 04/12/19 0350 04/12/19 0400 04/12/19 0410  BP: (!) 170/94 120/81 99/65 136/80  Pulse: (!) 138 (!) 127 (!) 120 (!) 127  Resp: (!) 35 (!) 22 (!) 25 (!) 24  Temp: (!) 100.6 F (38.1 C) 100.2 F (37.9 C) (!) 100.4 F (38 C) 100.2 F (37.9 C)  TempSrc:      SpO2: 100% 100% 100% 99%  Weight:      Height:      PainSc:        Isolation Precautions Airborne and Contact precautions  Medications Medications  0.9 %  sodium chloride infusion  ( Intravenous Transfusing/Transfer 04/12/19 0412)  ceFEPIme (MAXIPIME) 2 g in sodium chloride 0.9 % 100 mL IVPB (has no administration in time range)  vancomycin (VANCOCIN) IVPB 750 mg/150 ml premix (0 mg Intravenous Stopped 04/12/19 0359)  propofol (DIPRIVAN) 1000 MG/100ML infusion (25.008 mcg/kg/min  102.1 kg Intravenous Transfusing/Transfer 04/12/19 0412)  insulin aspart (novoLOG) injection 0-15 Units (has no administration in time range)  midazolam (VERSED) 50 mg/50 mL (1 mg/mL) premix infusion (2 mg/hr Intravenous Transfusing/Transfer 04/12/19 0413)  fentaNYL (SUBLIMAZE) injection 25 mcg (has no administration in time range)  fentaNYL 254mcg in NS 230mL (12mcg/ml) infusion-PREMIX (has no administration in time range)  fentaNYL (SUBLIMAZE) bolus via infusion 25 mcg (has no administration in time range)  ceFEPIme (MAXIPIME) 2 g in sodium chloride 0.9 % 100 mL IVPB (0 g Intravenous Stopped 04/11/19 2257)  metroNIDAZOLE (FLAGYL) IVPB 500 mg (0 mg Intravenous Stopped 04/11/19 2356)  vancomycin (VANCOCIN) IVPB 1000 mg/200 mL premix (0 mg Intravenous Stopped 04/12/19 0105)  sodium chloride 0.9 % bolus 1,000 mL (0 mLs Intravenous Stopped 04/12/19 0138)  etomidate (AMIDATE) injection 20 mg (20 mg Intravenous Given 04/12/19 0206)  succinylcholine (ANECTINE) injection 150 mg (150 mg Intravenous Given 04/12/19 0206)  albuterol (VENTOLIN) (5 MG/ML) 0.5% continuous inhalation solution (  Given 04/12/19 0224)  hydrALAZINE (APRESOLINE) injection 10 mg (10 mg Intravenous Given 04/12/19 0302)  morphine 4 MG/ML injection 4 mg (4 mg Intravenous Given 04/12/19 0325)  succinylcholine (ANECTINE) injection 150 mg (150 mg Intravenous Given 04/12/19 0321)  etomidate (AMIDATE) injection 20 mg (20 mg Intravenous Given 04/12/19 0321)  MIDAZOLAM 50MG Drexel Iha (1MG /ML) PREMIX INFUSION 5 mg/ml (has no administration in time range)    Mobility walks High fall risk   Focused Assessments    R Recommendations: See Admitting Provider  Note  Report given to:   Additional Notes:

## 2019-04-12 NOTE — ED Provider Notes (Addendum)
Nurse reports patient got acutely short of breath when he was trying to use the bathroom.  He had been on a nonrebreather mask and his pulse ox now is 77%.  Patient is diaphoretic, his color is poor.  He seems to have some poor mentation.  He is not really responding verbally.  Patient also appears to have some wheezing.  INTUBATION Performed by: Janice Norrie  Required items: required blood products, implants, devices, and special equipment available Patient identity confirmed: provided demographic data and hospital-assigned identification number Time out: Immediately prior to procedure a "time out" was called to verify the correct patient, procedure, equipment, support staff and site/side marked as required.  Indications: Hypoxia with respiratory distress  Intubation method: Glidescope Laryngoscopy   Preoxygenation: NRM  Sedatives: 20 mg Etomidate Paralytic: 150 mgSuccinylcholine  Tube Size: 8.0 cuffed with subglottic balloon  Post-procedure assessment: chest rise and ETCO2 monitor Breath sounds: equal and absent over the epigastrium Tube secured with: ETT holder Chest x-ray interpreted by radiologist and me.  Chest x-ray findings: endotracheal tube in appropriate position  Patient tolerated the procedure well with no immediate complications.   Dg Chest Portable 1 View  Result Date: 04/12/2019 CLINICAL DATA:  Post intubation EXAM: PORTABLE CHEST 1 VIEW COMPARISON:  April 11, 2019 FINDINGS: The endotracheal tube terminates approximately 4 cm above the carina. Bilateral multifocal airspace opacities are again noted. There is no pneumothorax. No large pleural effusion. Heart size is unchanged from prior. Aortic calcifications are again noted. IMPRESSION: 1. Endotracheal tube as above. 2. Multifocal airspace opacities consistent with a history of viral pneumonia. Electronically Signed   By: Constance Holster M.D.   On: 04/12/2019 02:45      Rolland Porter, MD 04/12/19 5945    Rolland Porter, MD 04/12/19 [phone]

## 2019-04-12 NOTE — ED Notes (Signed)
Dr Tomi Bamberger made aware of pt results- positive covid test

## 2019-04-12 NOTE — Progress Notes (Signed)
Pharmacy Antibiotic Note  Robert [name] is a 68 y.o. male admitted on 04/11/2019 with COVID-19 pneumonia.  Empiric abx started at [facility] with pharmacy consulted for vancomycin and cefepime dosing for unknown source.  WBC 6 Procalcitonin 0.32 Rising SCr 0.8 > 1.44 Tm 102  Plan: Continue Cefepime 2g IV q8h D/C Vanc per MD. Follow up renal function, culture results, and clinical course.   Height: 6' (182.9 cm) Weight: 225 lb (102.1 kg) IBW/kg (Calculated) : 77.6  Temp (24hrs), Avg:100.3 F (37.9 C), Min:97.9 F (36.6 C), Max:102 F (38.9 C)  Recent Labs  Lab 04/11/19 2125 04/12/19 0708  WBC 4.0 6.1  CREATININE 0.80 1.44*  LATICACIDVEN 1.2  --     Estimated Creatinine Clearance: 63.2 mL/min (A) (by C-G formula based on SCr of 1.44 mg/dL (H)).    Allergies  Allergen Reactions  . Ivp Dye [Iodinated Diagnostic Agents] Itching and Rash    Antimicrobials this admission: 7/31 vancomycin >>8/1 7/31 cefepime >> 7/31 metronidazole x1 8/1 Remdesivir >> 8/5 8/1 Actemra    Microbiology results: 7/31 BC x2:  ngtd 7/31 UCx:  pend 7/31 SARS CoV-2: positve 8/1 MRSA PCR: negative  Thank you for allowing pharmacy to be a part of this patient's care.  Gretta Arab PharmD, BCPS Clinical pharmacist phone 7am- 5pm: [phone] 04/12/2019 2:44 PM

## 2019-04-12 NOTE — Progress Notes (Signed)
PROGRESS NOTE    Robert [name]  WER:[id number] DOB: Jan 04, 1951 DOA: 04/11/2019 PCP: Redmond School, MD    Brief Narrative:  68 year old male with a history of COPD, coronary artery disease, diabetes, ischemic cardiomyopathy, rheumatoid arthritis, presents to the hospital with complaints of progressive worsening shortness of breath.  He was mildly febrile and had worsening shortness of breath so he called EMS.  In the ER, he was noted to be substantially hypoxic, had mental status changes.  He tested positive for COVID-19.  He was intubated in the emergency room for persistent hypoxia.  He has been transferred to [facility] for further management.   Assessment & Plan:   Active Problems:   Rheumatoid arthritis ([place])   COPD (chronic obstructive pulmonary disease) (HCC)   Controlled type 2 diabetes mellitus with diabetic peripheral angiopathy without gangrene, with long-term current use of insulin (HCC)   Essential hypertension   Ischemic cardiomyopathy   Coronary artery disease   Dyslipidemia (high LDL; low HDL)   COVID-19 virus detected   Acute respiratory distress syndrome (ARDS) due to COVID-19 virus   1. Acute respiratory failure with hypoxia secondary to COVID-19 pneumonia.  Patient is currently intubated and mechanically ventilated.  He has been started on a course Remdesivir.  He received a dose of Actemra today.  He is also been started on dexamethasone.  Chest x-ray shows bilateral infiltrates.  Procalcitonin is mildly elevated.  He is currently on cefepime.  Check sputum culture. 2. Hypotension.  Currently on vasopressors.  Possibly related to propofol which is being weaned off.  He is on multiple antihypertensives for cardiomyopathy which are currently held.  Continue to follow. 3. Rheumatoid arthritis.  Home medications indicate that he is chronically on methotrexate and Actemra. 4. Ischemic cardiomyopathy.  Last ejection fraction from 2019 was reportedly normal.  He  is on nitrates, ARB, beta blockers, and daily Lasix all of which are on hold due to hypotension. 5. Diabetes.  Continue decreased dose of basal insulin.  Follow blood sugars and sliding scale.  Hold oral agents. 6. Coronary artery disease with history of stents.  Continue on aspirin and Plavix for now. 7. Thrombocytopenia.  Appears to be present since 2018.  Near baseline.  Continue to follow. 8. COPD.  No evidence of wheezing at this time.  He is on steroids.  Continue to monitor.   DVT prophylaxis: Lovenox 40 mg every 12 hours Code Status: Full code Family Communication: discussed with daughter, Maudie Mercury 8/1 Disposition Plan: Continue monitoring in the ICU   Consultants:   PCCM  Procedures:   Intubation 7/31 >  Left subclavian central line 8/1 >  Antimicrobials:   Cefepime 7/31 >   Subjective: Intubated and sedated  Objective: Vitals:   04/12/19 1445 04/12/19 1500 04/12/19 1515 04/12/19 1530  BP: 97/66 97/65 96/64  94/63  Pulse: 78 78 77 75  Resp: (!) 22 (!) 22 (!) 23 (!) 24  Temp: 100 F (37.8 C) 99.9 F (37.7 C) 99.9 F (37.7 C) 99.9 F (37.7 C)  TempSrc:      SpO2: 100% 100% 100% 100%  Weight:      Height:        Intake/Output Summary (Last 24 hours) at 04/12/2019 1544 Last data filed at 04/12/2019 1400 Gross per 24 hour  Intake 4967.97 ml  Output 845 ml  Net 4122.97 ml   Filed Weights   04/11/19 2115  Weight: 102.1 kg    Examination:  General exam: Intubated and sedated Respiratory system:  Clear to auscultation. Respiratory effort normal. Cardiovascular system: S1 & S2 heard, RRR. No JVD, murmurs, rubs, gallops or clicks. No pedal edema. Gastrointestinal system: Abdomen is nondistended, soft and nontender. No organomegaly or masses felt. Normal bowel sounds heard. Central nervous system: Unable to assess due to sedation Extremities: Symmetric 5 x 5 power. Skin: No rashes, lesions or ulcers Psychiatry:sedated.     Data Reviewed: I have personally  reviewed following labs and imaging studies  CBC: Recent Labs  Lab 04/11/19 2125 04/12/19 0639 04/12/19 0708 04/12/19 1151  WBC 4.0  --  6.1  --   NEUTROABS 3.5  --   --   --   HGB 14.5 12.6* 12.8* 13.6  HCT 42.9 37.0* 38.0* 40.0  MCV 87.7  --  90.5  --   PLT 102*  --  102*  --    Basic Metabolic Panel: Recent Labs  Lab 04/11/19 2125 04/12/19 0639 04/12/19 0708 04/12/19 1151  NA 132* 133*  --  133*  K 4.2 4.0  --  4.3  CL 100  --   --   --   CO2 22  --   --   --   GLUCOSE 220*  --   --   --   BUN 20  --   --   --   CREATININE 0.80  --  1.44*  --   CALCIUM 8.4*  --   --   --   MG  --   --  1.4*  --   PHOS  --   --  4.1  --    GFR: Estimated Creatinine Clearance: 63.2 mL/min (A) (by C-G formula based on SCr of 1.44 mg/dL (H)). Liver Function Tests: Recent Labs  Lab 04/11/19 2125  AST 33  ALT 33  ALKPHOS 66  BILITOT 0.4  PROT 6.7  ALBUMIN 3.0*   No results for input(s): LIPASE, AMYLASE in the last 168 hours. No results for input(s): AMMONIA in the last 168 hours. Coagulation Profile: Recent Labs  Lab 04/11/19 2125  INR 1.1   Cardiac Enzymes: No results for input(s): CKTOTAL, CKMB, CKMBINDEX, TROPONINI in the last 168 hours. BNP (last 3 results) No results for input(s): PROBNP in the last 8760 hours. HbA1C: Recent Labs    04/11/19 2150  HGBA1C 7.4*   CBG: Recent Labs  Lab 04/12/19 0806 04/12/19 1215 04/12/19 1528  GLUCAP 250* 247* 254*   Lipid Profile: Recent Labs    04/12/19 0249  TRIG 184*   Thyroid Function Tests: No results for input(s): TSH, T4TOTAL, FREET4, T3FREE, THYROIDAB in the last 72 hours. Anemia Panel: Recent Labs    04/12/19 0708  FERRITIN 495*   Sepsis Labs: Recent Labs  Lab 04/11/19 2125 04/12/19 0708  PROCALCITON  --  0.32  LATICACIDVEN 1.2  --     Recent Results (from the past 240 hour(s))  Blood Culture (routine x 2)     Status: None (Preliminary result)   Collection Time: 04/11/19  9:25 PM   Specimen:  BLOOD  Result Value Ref Range Status   Specimen Description BLOOD  Final   Special Requests NONE  Final   Culture   Final    NO GROWTH < 12 HOURS Performed at [facility], [address] [place], [place] [address]    Report Status PENDING  Incomplete  Blood Culture (routine x 2)     Status: None (Preliminary result)   Collection Time: 04/11/19  9:45 PM   Specimen: BLOOD  Result  Value Ref Range Status   Specimen Description BLOOD  Final   Special Requests NONE  Final   Culture   Final    NO GROWTH < 12 HOURS Performed at [facility], [address], [place], [place] [address]    Report Status PENDING  Incomplete  SARS Coronavirus 2 [facility] order, Performed in [facility] hospital lab) Nasopharyngeal Nasopharyngeal Swab     Status: Abnormal   Collection Time: 04/11/19  9:50 PM   Specimen: Nasopharyngeal Swab  Result Value Ref Range Status   SARS Coronavirus 2 POSITIVE (A) NEGATIVE Final    Comment: RESULT CALLED TO, READ BACK BY AND VERIFIED WITH: TURNER,C. AT 2356 ON 04/11/2019 (NOTE) If result is NEGATIVE SARS-CoV-2 target nucleic acids are NOT DETECTED. The SARS-CoV-2 RNA is generally detectable in upper and lower  respiratory specimens during the acute phase of infection. The lowest  concentration of SARS-CoV-2 viral copies this assay can detect is 250  copies / mL. A negative result does not preclude SARS-CoV-2 infection  and should not be used as the sole basis for treatment or other  patient management decisions.  A negative result may occur with  improper specimen collection / handling, submission of specimen other  than nasopharyngeal swab, presence of viral mutation(s) within the  areas targeted by this assay, and inadequate number of viral copies  (<250 copies / mL). A negative result must be combined with clinical  observations, patient history, and epidemiological information. If result is POSITIVE SARS-CoV-2 target nucleic acids are DETECTED. The S ARS-CoV-2  RNA is generally detectable in upper and lower  respiratory specimens during the acute phase of infection.  Positive  results are indicative of active infection with SARS-CoV-2.  Clinical  correlation with patient history and other diagnostic information is  necessary to determine patient infection status.  Positive results do  not rule out bacterial infection or co-infection with other viruses. If result is PRESUMPTIVE POSTIVE SARS-CoV-2 nucleic acids MAY BE PRESENT.   A presumptive positive result was obtained on the submitted specimen  and confirmed on repeat testing.  While 2019 novel coronavirus  (SARS-CoV-2) nucleic acids may be present in the submitted sample  additional confirmatory testing may be necessary for epidemiological  and / or clinical management purposes  to differentiate between  SARS-CoV-2 and other Sarbecovirus currently known to infect humans.  If clinically indicated additional testing with an alternate test  methodology [phone]) is adv ised. The SARS-CoV-2 RNA is generally  detectable in upper and lower respiratory specimens during the acute  phase of infection. The expected result is Negative. Fact Sheet for Patients:  StrictlyIdeas.no Fact Sheet for Healthcare Providers: BankingDealers.co.za This test is not yet approved or cleared by the [place] FDA and has been authorized for detection and/or diagnosis of SARS-CoV-2 by FDA under an Emergency Use Authorization (EUA).  This EUA will remain in effect (meaning this test can be used) for the duration of the COVID-19 declaration under Section 564(b)(1) of the Act, 21 U.S.C. section 360bbb-3(b)(1), unless the authorization is terminated or revoked sooner. Performed at [facility], [address], [place], [place] [address]   MRSA PCR Screening     Status: None   Collection Time: 04/12/19  6:56 AM   Specimen: Nasal Mucosa; Nasopharyngeal  Result Value Ref  Range Status   MRSA by PCR NEGATIVE NEGATIVE Final    Comment:        The GeneXpert MRSA Assay (FDA approved for NASAL specimens only), is one component  of a comprehensive MRSA colonization surveillance program. It is not intended to diagnose MRSA infection nor to guide or monitor treatment for MRSA infections. Performed at [facility], [place] [address], [place], [place] [address]          Radiology Studies: Dg Chest Port 1 View  Result Date: 04/12/2019 CLINICAL DATA:  Central line placement EXAM: PORTABLE CHEST 1 VIEW COMPARISON:  April 12, 2019 4 a.m. FINDINGS: The mediastinal contour and cardiac silhouette are stable. Endotracheal tube is identified with distal tip 5.1 cm from carina. Nasogastric tube is identified distal tip not included on film. A left central venous line is identified distal tip in the superior vena cava. There is no pneumothorax. Patchy consolidation of throughout the left lung and to a much lesser degree in the right lung base are decreased compared to prior exam. IMPRESSION: Left central venous line with distal tip in the superior vena cava. There is no pneumothorax. Consolidation of bilateral lungs are decreased compared prior exam. Electronically Signed   By: Abelardo Diesel M.D.   On: 04/12/2019 11:52   Dg Chest Portable 1 View  Result Date: 04/12/2019 CLINICAL DATA:  Status post intubation. EXAM: PORTABLE CHEST 1 VIEW COMPARISON:  Chest radiograph 04/12/2019 FINDINGS: Monitoring leads overlie the patient. ET tube terminates in the distal trachea. Enteric tube courses inferior to the diaphragm. Stable cardiac and mediastinal contours. Similar-appearing diffuse bilateral areas of consolidation. Probable small left pleural effusion. IMPRESSION: ET tube terminates in the distal trachea. Similar multifocal areas of consolidation. Electronically Signed   By: Lovey Newcomer M.D.   On: 04/12/2019 04:45   Dg Chest Portable 1 View  Result Date: 04/12/2019  CLINICAL DATA:  Post intubation EXAM: PORTABLE CHEST 1 VIEW COMPARISON:  April 11, 2019 FINDINGS: The endotracheal tube terminates approximately 4 cm above the carina. Bilateral multifocal airspace opacities are again noted. There is no pneumothorax. No large pleural effusion. Heart size is unchanged from prior. Aortic calcifications are again noted. IMPRESSION: 1. Endotracheal tube as above. 2. Multifocal airspace opacities consistent with a history of viral pneumonia. Electronically Signed   By: Constance Holster M.D.   On: 04/12/2019 02:45   Dg Chest Port 1 View  Result Date: 04/11/2019 CLINICAL DATA:  Dry cough and shortness of breath over the last 2 days. EXAM: PORTABLE CHEST 1 VIEW COMPARISON:  08/26/2018 FINDINGS: Bilateral bronchopneumonia, worse on the left than the right. Left disease is more pronounced in the mid and lower lung and right lung disease is more pronounced in the lower lung. Small amount of effusion on the left. This could be a pattern of bacterial or viral pneumonia. Heart size is normal with left ventricular prominence. There are coronary artery stents. Aortic atherosclerosis is present. IMPRESSION: Bilateral bronchopneumonia left worse than right. This could be due to bacterial or viral pneumonia. Electronically Signed   By: Nelson Chimes M.D.   On: 04/11/2019 21:55        Scheduled Meds: . aspirin  81 mg Oral Daily  . chlorhexidine gluconate (MEDLINE KIT)  15 mL Mouth Rinse BID  . Chlorhexidine Gluconate Cloth  6 each Topical Daily  . clopidogrel  75 mg Per Tube Daily  . dexamethasone (DECADRON) injection  6 mg Intravenous Q24H  . enoxaparin (LOVENOX) injection  40 mg Subcutaneous Q12H  . feeding supplement (PRO-STAT SUGAR FREE 64)  30 mL Per Tube BID  . feeding supplement (VITAL HIGH PROTEIN)  1,000 mL Per Tube Q24H  . fentaNYL (SUBLIMAZE) injection  50 mcg Intravenous Once  . folic acid  1 mg Per Tube Daily  . insulin aspart  0-20 Units Subcutaneous Q4H  . insulin  glargine  15 Units Subcutaneous BID  . mouth rinse  15 mL Mouth Rinse 10 times per day  . pantoprazole (PROTONIX) IV  40 mg Intravenous Daily   Continuous Infusions: . sodium chloride 75 mL/hr at 04/12/19 0142  . ceFEPime (MAXIPIME) IV 2 g (04/12/19 1438)  . fentaNYL infusion INTRAVENOUS 100 mcg/hr (04/12/19 1400)  . midazolam 4 mg/hr (04/12/19 1400)  . phenylephrine (NEO-SYNEPHRINE) Adult infusion 25 mcg/min (04/12/19 1400)  . propofol (DIPRIVAN) infusion 5 mcg/kg/min (04/12/19 1400)  . [START ON 04/13/2019] remdesivir 100 mg in NS 250 mL       LOS: 0 days    Critical care time: 35 minutes    Kathie Dike, MD [facility]   If 7PM-7AM, please contact night-coverage [email or web address]  04/12/2019, 3:44 PM

## 2019-04-12 NOTE — Care Management (Signed)
Case manager will continue to monitor patient for appropriate disposition when He medically improves, may he be blessed to do so.      Ricki Miller, RN BSN Case Manager [phone]

## 2019-04-12 NOTE — ED Notes (Signed)
[facility] requested order for Fentanyl drip- DR Geanie Logan notified- new orders received- Octavia Bruckner, [facility] made aware of need for medication from upstairs pharmacy.

## 2019-04-12 NOTE — ED Notes (Signed)
Pt daughter, Maudie Mercury notified that [facility] has arrived to transport pt to [facility] as promised during our last conversation.  Also, explained the importance of pt son self quarantining for 14 days as he lives with pt and is at high risk for having Covid-19. Informed that testing is free and available to anyone across the street from [facility].

## 2019-04-12 NOTE — ED Notes (Signed)
[facility] remains at bedside-  Pt being prepared to transport.

## 2019-04-12 NOTE — ED Notes (Signed)
Dr Earnest Conroy at bedside for pt admission

## 2019-04-12 NOTE — ED Provider Notes (Addendum)
Patient extubated himself.  Patient was reintubated.  He now has soft hand restraints and Versed was added for his sedation.  He was already on propofol.  INTUBATION Performed by: Janice Norrie  Required items: required blood products, implants, devices, and special equipment available Patient identity confirmed: provided demographic data and hospital-assigned identification number Time out: Immediately prior to procedure a "time out" was called to verify the correct patient, procedure, equipment, support staff and site/side marked as required.  Indications: Hypoxic respiratory failure  Intubation method: Glidescope Laryngoscopy   Preoxygenation: BVM  Sedatives: 20 mg Etomidate Paralytic: 150 mg Succinylcholine  Tube Size: 8.0 cuffed with subglottic balloon.  Post-procedure assessment: chest rise and ETCO2 monitor Breath sounds: equal and absent over the epigastrium Tube secured with: ETT holder Chest x-ray interpreted by radiologist and m after prior intubation and was at good position.  Tube was placed at the same which was 24 cm at the lip.  The tube was visualized going through the cords.  Patient tolerated the procedure well with no immediate complications.    Rolland Porter, MD 04/12/19 Alroy Bailiff    Rolland Porter, MD 04/12/19 [phone]

## 2019-04-12 NOTE — Progress Notes (Signed)
**Note De-identified  Obfuscation** Sputum collected and sent to lab 

## 2019-04-12 NOTE — ED Notes (Signed)
[name] and [place] RT and DR Tomi Bamberger at bedside.

## 2019-04-12 NOTE — Consult Note (Signed)
NAME:  Robert [name], MRN:  [id number], DOB:  October 26, 1950, LOS: 0 ADMISSION DATE:  04/11/2019, CONSULTATION DATE:  8/1 REFERRING MD: Dr. Roderic Palau , CHIEF COMPLAINT:  Dyspnea   Brief History   68 year old male admitted on April 12, 2019 in the setting of ARDS from COVID-19 pneumonia.  Required intubation and mechanical ventilation at an outside hospital, has high sedative needs.  History of present illness   This is a 67 year old male with an extensive past medical history including vascular and pulmonary disease who presented to [facility] on April 11, 2019 complaining of shortness of breath and fever.  He had been tested for COVID the day before.  He was noted to have bilateral infiltrates, severe hypoxemia and required intubation.  By report he became awake at the outside hospital and self extubated there in the emergency room requiring reintubation.  No further history could be obtained because the patient was sedated and intubated.  Past Medical History  Anxiety COPD GERD CAD Hypertension Hyperlipidemia Rheumatoid arthritis History of systolic heart failure, however 08/2018 Echo showed LVEF 50-55%, left atrium dilated  Significant Hospital Events   8/1 admission to [facility] ICU  Consults:  PCCM  Procedures:  8/1 ETT >  8/1 L subclavian CVL >   Significant Diagnostic Tests:  08/2018 Echo> LVEF 50-55%, left atrium dilated  Micro Data:  7/31 blood >  7/31 sars-cov-2 > positive  Antimicrobials:  7/31 Cefepime >  7/31 Flagyl >  7/31 Vanc >  8/1 remdesivir > 8/1 actemra >  8/1 decadron >    Interim history/subjective:  Admitted overnight, details as above  Objective   Blood pressure (!) 78/54, pulse 93, temperature 99.5 F (37.5 C), resp. rate (!) 26, height 6' (1.829 m), weight 102.1 kg, SpO2 99 %.    Vent Mode: PRVC FiO2 (%):  [100 %] 100 % Set Rate:  [16 bmp-26 bmp] 26 bmp Vt Set:  [884 mL] 620 mL PEEP:  [6 cmH20] 6 cmH20 Plateau Pressure:  [19 cmH20-26  cmH20] 19 cmH20   Intake/Output Summary (Last 24 hours) at 04/12/2019 0817 Last data filed at 04/12/2019 0403 Gross per 24 hour  Intake 1083.1 ml  Output 620 ml  Net 463.1 ml   Filed Weights   04/11/19 2115  Weight: 102.1 kg    Examination:  General:  In bed on vent HENT: NCAT ETT in place PULM: CTA B, vent supported breathing CV: RRR, no mgr GI: BS+, soft, nontender MSK: normal bulk and tone Neuro: sedated on vent  August 1 chest x-ray images independently reviewed showing severe bilateral airspace disease left greater than right, cardiomegaly, endotracheal tube in place, left subclavian line in place  Resolved Hospital Problem list     Assessment & Plan:  Severe acute respiratory failure with hypoxemia secondary to COVID-19 pneumonia: At this time infiltrates are worse than hypoxemia and oxygen need, at high risk for worsening given comorbid illnesses. Continue mechanical ventilation per ARDS protocol Target TVol 6-8cc/kgIBW Target Plateau Pressure < 30cm H20 Target driving pressure less than 15 cm of water Target PaO2 55-65: titrate PEEP/FiO2 per protocol As long as PaO2 to FiO2 ratio is less than 1:150 position in prone position for 16 hours a day Check CVP daily if CVL in place Target CVP less than 4, diurese as necessary Ventilator associated pneumonia prevention protocol 8/1 plan: check CVP  Need for sedation/mechanical ventilation RASS target -3 PAD protocol: fentanyl, versed, wean off propofol  Hypotension due to propofol Place CVL Check  CVP Wean off propofol Wean off neosynephrine for MAP > 65  Best practice:  Diet: start tube feeding Pain/Anxiety/Delirium protocol (if indicated): as above VAP protocol (if indicated): yes DVT prophylaxis: adjust dose of lovenox to 0.5mg /kg  GI prophylaxis: Pantoprazole for stress ulcer prophylaxis Glucose control: SSI Mobility: bed rest Code Status: full Family Communication: will update today Disposition: remain in  ICU  Labs   CBC: Recent Labs  Lab 04/11/19 2125 04/12/19 0639  WBC 4.0  --   NEUTROABS 3.5  --   HGB 14.5 12.6*  HCT 42.9 37.0*  MCV 87.7  --   PLT 102*  --     Basic Metabolic Panel: Recent Labs  Lab 04/11/19 2125 04/12/19 0639 04/12/19 0708  NA 132* 133*  --   K 4.2 4.0  --   CL 100  --   --   CO2 22  --   --   GLUCOSE 220*  --   --   BUN 20  --   --   CREATININE 0.80  --  1.44*  CALCIUM 8.4*  --   --    GFR: Estimated Creatinine Clearance: 63.2 mL/min (A) (by C-G formula based on SCr of 1.44 mg/dL (H)). Recent Labs  Lab 04/11/19 2125  WBC 4.0  LATICACIDVEN 1.2    Liver Function Tests: Recent Labs  Lab 04/11/19 2125  AST 33  ALT 33  ALKPHOS 66  BILITOT 0.4  PROT 6.7  ALBUMIN 3.0*   No results for input(s): LIPASE, AMYLASE in the last 168 hours. No results for input(s): AMMONIA in the last 168 hours.  ABG    Component Value Date/Time   PHART 7.319 (L) 04/12/2019 0639   PCO2ART 39.2 04/12/2019 0639   PO2ART 86.0 04/12/2019 0639   HCO3 20.2 04/12/2019 0639   TCO2 21 (L) 04/12/2019 0639   ACIDBASEDEF 6.0 (H) 04/12/2019 0639   O2SAT 96.0 04/12/2019 0639     Coagulation Profile: Recent Labs  Lab 04/11/19 2125  INR 1.1    Cardiac Enzymes: No results for input(s): CKTOTAL, CKMB, CKMBINDEX, TROPONINI in the last 168 hours.  HbA1C: Hgb A1c MFr Bld  Date/Time Value Ref Range Status  06/07/2017 12:02 AM 9.1 (H) 4.8 - 5.6 % Final    Comment:    (NOTE) Pre diabetes:          5.7%-6.4% Diabetes:              >6.4% Glycemic control for   <7.0% adults with diabetes   03/05/2017 06:34 AM 8.8 (H) 4.8 - 5.6 % Final    Comment:    (NOTE)         Pre-diabetes: 5.7 - 6.4         Diabetes: >6.4         Glycemic control for adults with diabetes: <7.0     CBG: No results for input(s): GLUCAP in the last 168 hours.  Review of Systems:   Could not obtain due to inubation  Past Medical History  He,  has a past medical history of Anxiety,  COPD (chronic obstructive pulmonary disease) ([place]), Coronary artery disease, Full dentures, GERD (gastroesophageal reflux disease), High cholesterol, History of MI (myocardial infarction), Hypertension, Incarcerated umbilical hernia (22/9798), Inguinal hernia (05/2016), Insulin dependent diabetes mellitus ([place]), Ischemic cardiomyopathy, Left leg cellulitis (10/08/2016), and Rheumatoid arthritis(714.0).   Surgical History    Past Surgical History:  Procedure Laterality Date  . CARDIAC CATHETERIZATION N/A 02/01/2015   Procedure: Left Heart Cath and Coronary  Angiography;  Surgeon: Belva Crome, MD;  Location: [place] CV LAB;  Service: Cardiovascular;  Laterality: N/A;  . CARDIAC CATHETERIZATION N/A 03/22/2015   Procedure: Left Heart Cath and Coronary Angiography;  Surgeon: Leonie Man, MD;  Location: [place] CV LAB;  Service: Cardiovascular;  Laterality: N/A;  . CARDIAC CATHETERIZATION  09/05/2002; 03/09/2003; 04/10/2005;06/02/2005; 05/09/2006; 02/09/2007; 03/01/2007  . COLONOSCOPY  2008   Dr. Gala Romney: diverticulosis, hyperplastic polyp.  . CORONARY ANGIOPLASTY  11/14/2012; 02/01/2015  . CORONARY ANGIOPLASTY WITH STENT PLACEMENT  05/16/2012   "1; makes total ~ 4"  . CORONARY STENT INTERVENTION N/A 06/06/2017   Procedure: CORONARY STENT INTERVENTION;  Surgeon: Belva Crome, MD;  Location: [place] CV LAB;  Service: Cardiovascular;  Laterality: N/A;  . INSERTION OF MESH Bilateral 05/24/2016   Procedure: INSERTION OF MESH;  Surgeon: Coralie Keens, MD;  Location: [place];  Service: General;  Laterality: Bilateral;  . LAPAROSCOPIC CHOLECYSTECTOMY    . LAPAROSCOPIC INGUINAL HERNIA WITH UMBILICAL HERNIA Bilateral 05/24/2016   Procedure: BILATERAL LAPAROSCOPIC INGUINAL HERNIA REPAIR WITH MESH AND UMBILICAL HERNIA REPAIR WITH MESH;  Surgeon: Coralie Keens, MD;  Location: [place];  Service: General;  Laterality: Bilateral;  . LEFT HEART CATH AND CORONARY  ANGIOGRAPHY N/A 06/06/2017   Procedure: LEFT HEART CATH AND CORONARY ANGIOGRAPHY;  Surgeon: Belva Crome, MD;  Location: [place] CV LAB;  Service: Cardiovascular;  Laterality: N/A;  . LEFT HEART CATHETERIZATION WITH CORONARY ANGIOGRAM N/A 12/22/2011   Procedure: LEFT HEART CATHETERIZATION WITH CORONARY ANGIOGRAM;  Surgeon: Troy Sine, MD;  Location: [facility] CATH LAB;  Service: Cardiovascular;  Laterality: N/A;  . LEFT HEART CATHETERIZATION WITH CORONARY ANGIOGRAM Bilateral 05/16/2012   Procedure: LEFT HEART CATHETERIZATION WITH CORONARY ANGIOGRAM;  Surgeon: Lorretta Harp, MD;  Location: [facility] CATH LAB;  Service: Cardiovascular;  Laterality: Bilateral;  . LEFT HEART CATHETERIZATION WITH CORONARY ANGIOGRAM N/A 11/14/2012   Procedure: LEFT HEART CATHETERIZATION WITH CORONARY ANGIOGRAM;  Surgeon: Troy Sine, MD;  Location: [facility] CATH LAB;  Service: Cardiovascular;  Laterality: N/A;  . LEFT HEART CATHETERIZATION WITH CORONARY ANGIOGRAM N/A 09/01/2013   Procedure: LEFT HEART CATHETERIZATION WITH CORONARY ANGIOGRAM;  Surgeon: Leonie Man, MD;  Location: [facility] CATH LAB;  Service: Cardiovascular;  Laterality: N/A;  . [place]; 1973; 1985   x 3  . NM MYOCAR PERF WALL MOTION  08/30/2009   No significant ischemia  . OPEN REDUCTION INTERNAL FIXATION (ORIF) DISTAL RADIAL FRACTURE Right 12/10/2016   Procedure: OPEN REDUCTION INTERNAL FIXATION (ORIF) DISTAL RADIAL FRACTURE;  Surgeon: Iran Planas, MD;  Location: [place];  Service: Orthopedics;  Laterality: Right;  . PERCUTANEOUS CORONARY INTERVENTION-BALLOON ONLY  12/22/2011   Procedure: PERCUTANEOUS CORONARY INTERVENTION-BALLOON ONLY;  Surgeon: Troy Sine, MD;  Location: [facility] CATH LAB;  Service: Cardiovascular;;  . PERCUTANEOUS CORONARY INTERVENTION-BALLOON ONLY  11/14/2012   Procedure: PERCUTANEOUS CORONARY INTERVENTION-BALLOON ONLY;  Surgeon: Troy Sine, MD;  Location: [facility] CATH LAB;  Service: Cardiovascular;;  . PERCUTANEOUS CORONARY STENT  INTERVENTION (PCI-S)  05/16/2012   Procedure: PERCUTANEOUS CORONARY STENT INTERVENTION (PCI-S);  Surgeon: Lorretta Harp, MD;  Location: [facility] CATH LAB;  Service: Cardiovascular;;  . PERCUTANEOUS CORONARY STENT INTERVENTION (PCI-S)  09/01/2013   Procedure: PERCUTANEOUS CORONARY STENT INTERVENTION (PCI-S);  Surgeon: Leonie Man, MD;  Location: [facility] CATH LAB;  Service: Cardiovascular;;     Social History   reports that he has been smoking cigarettes. He has been smoking about 1.00 pack per day. He  has never used smokeless tobacco. He reports that he does not drink alcohol or use drugs.   Family History   His family history includes Colon cancer in his brother and mother; Heart disease in his father and mother; Hypertension in his mother; Lung cancer in his mother.   Allergies Allergies  Allergen Reactions  . Ivp Dye [Iodinated Diagnostic Agents] Itching and Rash     Home Medications  Prior to Admission medications   Medication Sig Start Date End Date Taking? Authorizing Provider  acarbose (PRECOSE) 100 MG tablet Take 100 mg by mouth 3 (three) times daily with meals.  03/08/17   [provider]  acetaminophen (TYLENOL) 650 MG CR tablet Take 650 mg by mouth every 8 (eight) hours as needed for pain.    [provider]  ALPRAZolam Duanne Moron) 0.5 MG tablet Take 0.5 mg by mouth 3 (three) times daily as needed for anxiety.     [provider]  AMBULATORY NON FORMULARY MEDICATION Take 600 mg by mouth daily at 12 noon. Medication Name: Dalcetrapib vs placebo Patient taking differently: Take 600 mg by mouth every evening. Medication Name: Dalcetrapib vs placebo: Cholesterol 12/15/16   Jettie Booze, MD  aspirin EC 81 MG EC tablet Take 1 tablet (81 mg total) by mouth daily. 11/15/12   Brett Canales, PA-C  atorvastatin (LIPITOR) 80 MG tablet TAKE 1 TABLET DAILY AT 6 PM. 10/30/18   Croitoru, Mihai, MD  clopidogrel (PLAVIX) 75 MG tablet TAKE (1) TABLET BY MOUTH ONCE DAILY. 02/17/19    Croitoru, Mihai, MD  fish oil-omega-3 fatty acids 1000 MG capsule Take 1 g by mouth daily.     [provider]  folic acid (FOLVITE) 1 MG tablet Take 1 mg by mouth daily.     [provider]  furosemide (LASIX) 40 MG tablet Take 80mg  po bid for 5 days then 80mg  po daily 02/17/19   Croitoru, Mihai, MD  gabapentin (NEURONTIN) 300 MG capsule Take 300 mg by mouth 3 (three) times daily as needed (for pain).  03/10/17   [provider]  glipiZIDE (GLUCOTROL) 10 MG tablet Take 10 mg by mouth 2 (two) times daily. 02/12/17   [provider]  Insulin Glargine (TOUJEO SOLOSTAR [facility]) Inject 80 Units into the skin daily.    [provider]  isosorbide mononitrate (IMDUR) 60 MG 24 hr tablet Take 1 tablet (60 mg total) by mouth daily. 03/25/19   Croitoru, Mihai, MD  losartan (COZAAR) 25 MG tablet Take 0.5 tablets (12.5 mg total) by mouth daily. 06/12/17   Bhagat, Crista Luria, PA  metFORMIN (GLUCOPHAGE) 1000 MG tablet Take 1 tablet (1,000 mg total) by mouth 2 (two) times daily with a meal. Patient taking differently: Take 1,000-1,250 mg by mouth See admin instructions. Take 1250 mg by mouth in the morning and 1000 mg in the evening 03/23/15   Bhagat, Willard, PA  methotrexate 50 MG/2ML injection Inject 2 mLs into the skin once a week. Takes every Thursday 12/01/17   [provider]  metoprolol tartrate (LOPRESSOR) 25 MG tablet TAKE 1 TABLET TWICE DAILY. KEEP JULY APPOINTMENT. 02/24/19   Croitoru, Mihai, MD  nitroGLYCERIN (NITROSTAT) 0.4 MG SL tablet Place 1 tablet (0.4 mg total) under the tongue every 5 (five) minutes x 3 doses as needed for chest pain. 04/24/16   Croitoru, Mihai, MD  potassium chloride SA (K-DUR) 20 MEQ tablet 1 TABLET BY MOUTH DAILY. 03/13/19   Croitoru, Mihai, MD  primidone (MYSOLINE) 50 MG tablet Take 100  mg by mouth daily.     [provider]  Tocilizumab 162 MG/0.9ML SOSY Inject 162 mg into the skin every Monday. ACTEMRA    [provider]     Critical care time: 40 minutes     Roselie Awkward, MD [place] PCCM Pager: [phone] Cell: [phone] If no response, call [phone]

## 2019-04-12 NOTE — ED Notes (Signed)
Spoke with Maudie Mercury (pt daughter) and updated her on pt status. Daughter said her father texted her earlier saying he was going to be transferred and that he was covid positive.  This nurse verified that pt is covid positive and is now intubated a/w ICU bed at [facility]. Daughter was thankful for update.

## 2019-04-12 NOTE — ED Notes (Addendum)
Pt intubated at this time. Positive color change. Bilateral breath sounds noted. ETT8.0@ 24 lip.

## 2019-04-12 NOTE — Progress Notes (Signed)
Remdesivir - Pharmacy Brief Note   O:  ALT: 33 CXR: diffuse bilateral areas of consolidation SpO2: 92% on 2L >> Intubated   A/P:  Patient meets criteria for remdesivir. Will initiate remdesivir 200 mg once followed by 100 mg daily x 4 days.   Gretta Arab PharmD, BCPS Clinical pharmacist phone 7am- 5pm: [phone] 04/12/2019 8:31 AM

## 2019-04-13 ENCOUNTER — Inpatient Hospital Stay (HOSPITAL_COMMUNITY): Payer: Medicare HMO

## 2019-04-13 DIAGNOSIS — I10 Essential (primary) hypertension: Secondary | ICD-10-CM

## 2019-04-13 DIAGNOSIS — E1151 Type 2 diabetes mellitus with diabetic peripheral angiopathy without gangrene: Secondary | ICD-10-CM

## 2019-04-13 DIAGNOSIS — Z794 Long term (current) use of insulin: Secondary | ICD-10-CM

## 2019-04-13 DIAGNOSIS — J449 Chronic obstructive pulmonary disease, unspecified: Secondary | ICD-10-CM

## 2019-04-13 DIAGNOSIS — E785 Hyperlipidemia, unspecified: Secondary | ICD-10-CM

## 2019-04-13 LAB — CBC WITH DIFFERENTIAL/PLATELET
Abs Immature Granulocytes: 0.02 10*3/uL (ref 0.00–0.07)
Basophils Absolute: 0 10*3/uL (ref 0.0–0.1)
Basophils Relative: 0 %
Eosinophils Absolute: 0 10*3/uL (ref 0.0–0.5)
Eosinophils Relative: 0 %
HCT: 39.2 % (ref 39.0–52.0)
Hemoglobin: 12.9 g/dL — ABNORMAL LOW (ref 13.0–17.0)
Immature Granulocytes: 1 %
Lymphocytes Relative: 15 %
Lymphs Abs: 0.6 10*3/uL — ABNORMAL LOW (ref 0.7–4.0)
MCH: 29.9 pg (ref 26.0–34.0)
MCHC: 32.9 g/dL (ref 30.0–36.0)
MCV: 90.7 fL (ref 80.0–100.0)
Monocytes Absolute: 0.3 10*3/uL (ref 0.1–1.0)
Monocytes Relative: 8 %
Neutro Abs: 2.9 10*3/uL (ref 1.7–7.7)
Neutrophils Relative %: 76 %
Platelets: 104 10*3/uL — ABNORMAL LOW (ref 150–400)
RBC: 4.32 MIL/uL (ref 4.22–5.81)
RDW: 15.6 % — ABNORMAL HIGH (ref 11.5–15.5)
WBC Morphology: INCREASED
WBC: 3.8 10*3/uL — ABNORMAL LOW (ref 4.0–10.5)
nRBC: 0 % (ref 0.0–0.2)

## 2019-04-13 LAB — COMPREHENSIVE METABOLIC PANEL
ALT: 48 U/L — ABNORMAL HIGH (ref 0–44)
AST: 63 U/L — ABNORMAL HIGH (ref 15–41)
Albumin: 2.3 g/dL — ABNORMAL LOW (ref 3.5–5.0)
Alkaline Phosphatase: 60 U/L (ref 38–126)
Anion gap: 9 (ref 5–15)
BUN: 44 mg/dL — ABNORMAL HIGH (ref 8–23)
CO2: 22 mmol/L (ref 22–32)
Calcium: 7.2 mg/dL — ABNORMAL LOW (ref 8.9–10.3)
Chloride: 104 mmol/L (ref 98–111)
Creatinine, Ser: 1.74 mg/dL — ABNORMAL HIGH (ref 0.61–1.24)
GFR calc Af Amer: 47 mL/min — ABNORMAL LOW (ref >60)
GFR calc non Af Amer: 40 mL/min — ABNORMAL LOW (ref >60)
Glucose, Bld: 196 mg/dL — ABNORMAL HIGH (ref 70–99)
Potassium: 4 mmol/L (ref 3.5–5.1)
Sodium: 135 mmol/L (ref 135–145)
Total Bilirubin: 0.5 mg/dL (ref 0.3–1.2)
Total Protein: 5.7 g/dL — ABNORMAL LOW (ref 6.5–8.1)

## 2019-04-13 LAB — PHOSPHORUS
Phosphorus: 4 mg/dL (ref 2.5–4.6)
Phosphorus: 3.5 mg/dL (ref 2.5–4.6)

## 2019-04-13 LAB — C-REACTIVE PROTEIN: CRP: 15.7 mg/dL — ABNORMAL HIGH (ref <1.0)

## 2019-04-13 LAB — URINE CULTURE: Culture: NO GROWTH

## 2019-04-13 LAB — MAGNESIUM
Magnesium: 1.9 mg/dL (ref 1.7–2.4)
Magnesium: 2 mg/dL (ref 1.7–2.4)

## 2019-04-13 LAB — GLUCOSE, CAPILLARY
Glucose-Capillary: 180 mg/dL — ABNORMAL HIGH (ref 70–99)
Glucose-Capillary: 181 mg/dL — ABNORMAL HIGH (ref 70–99)
Glucose-Capillary: 331 mg/dL — ABNORMAL HIGH (ref 70–99)
Glucose-Capillary: 273 mg/dL — ABNORMAL HIGH (ref 70–99)
Glucose-Capillary: 190 mg/dL — ABNORMAL HIGH (ref 70–99)

## 2019-04-13 LAB — NOVEL CORONAVIRUS, NAA: SARS-CoV-2, NAA: DETECTED — AB

## 2019-04-13 MED ORDER — PRO-STAT SUGAR FREE PO LIQD: 60.0000 mL | Freq: Three times a day (TID) | ORAL | Status: DC

## 2019-04-13 MED ORDER — FREE WATER
100.0000 mL | Freq: Four times a day (QID) | Status: DC
  Administered 2019-04-13 (×2): 100 mL

## 2019-04-13 MED ORDER — FENTANYL CITRATE (PF) 100 MCG/2ML IJ SOLN: 25.0000 ug | INTRAMUSCULAR | Status: DC | PRN

## 2019-04-13 MED ORDER — ADULT MULTIVITAMIN LIQUID CH
15.0000 mL | Freq: Every day | ORAL | Status: DC
  Administered 2019-04-13 – 2019-05-05 (×23): 15 mL
  Filled 2019-04-13 (×24): qty 15

## 2019-04-13 MED ORDER — LORAZEPAM 2 MG/ML IJ SOLN
0.5000 mg | INTRAMUSCULAR | Status: DC | PRN
  Filled 2019-04-13: qty 1

## 2019-04-13 MED ORDER — PANTOPRAZOLE SODIUM 40 MG PO PACK
40.0000 mg | PACK | Freq: Every day | ORAL | Status: DC
  Administered 2019-04-13 – 2019-05-04 (×22): 40 mg
  Filled 2019-04-13 (×21): qty 20

## 2019-04-13 NOTE — Progress Notes (Signed)
Updated patient daughter on pt post extubation, facilitated conversation over phone with patient and [name].

## 2019-04-13 NOTE — Progress Notes (Signed)
NAME:  Robert [name], MRN:  [id number], DOB:  1951-02-22, LOS: 1 ADMISSION DATE:  04/11/2019, CONSULTATION DATE:  8/1 REFERRING MD:  Dr. Roderic Palau, CHIEF COMPLAINT:  dyspnea   Brief History   68 y/o male admitted on 8/1 in the setting of ARDS from COVID-19 pneumonia.  Required intubation and mechanical ventilation at an outside hospital, has high sedative needs   Past Medical History  Anxiety COPD GERD CAD Hypertension Hyperlipidemia Rheumatoid arthritis History of systolic heart failure, however 08/2018 Echo showed LVEF 50-55%, left atrium dilated  Significant Hospital Events   8/1 admission to [facility] ICU  Consults:  PCCM  Procedures:  8/1 ETT >  8/1 L subclavian CVL >   Significant Diagnostic Tests:  08/2018 Echo> LVEF 50-55%, left atrium dilated  Micro Data:  7/31 blood >  7/31 sars-cov-2 > positive  Antimicrobials:  7/31 Cefepime >  7/31 Flagyl > 7/31 7/31 Vanc > 7/31 8/1 remdesivir > 8/1 actemra >  8/1 decadron >    Interim history/subjective:   Vent needs relatively minimal Hasn't weaned yet   Objective   Blood pressure 113/65, pulse 79, temperature 99.3 F (37.4 C), resp. rate 14, height 6' (1.829 m), weight 109 kg, SpO2 95 %. CVP:  [7 mmHg-11 mmHg] 8 mmHg  Vent Mode: PRVC FiO2 (%):  [40 %-60 %] 40 % Set Rate:  [26 bmp-35 bmp] 26 bmp Vt Set:  [460 mL-540 mL] 460 mL PEEP:  [8 cmH20-10 cmH20] 8 cmH20 Plateau Pressure:  [18 cmH20-20 cmH20] 19 cmH20   Intake/Output Summary (Last 24 hours) at 04/13/2019 0746 Last data filed at 04/13/2019 0600 Gross per 24 hour  Intake 5410.02 ml  Output 790 ml  Net 4620.02 ml   Filed Weights   04/11/19 2115 04/13/19 0500  Weight: 102.1 kg 109 kg    Examination: General:  In bed on vent HENT: NCAT ETT in place PULM: CTA B, vent supported breathing CV: RRR, no mgr GI: BS+, soft, nontender MSK: normal bulk and tone Neuro: sedated on vent  8/1 CXR images reviewed: consolidation/infiltrate left lung more than R,  ETT in place  Resolved Hospital Problem list     Assessment & Plan:  Severe acute respiratory failure with hypoxemia secondary to COVID-19 pneumonia: At this time infiltrates are worse than hypoxemia and oxygen need, at high risk for worsening given comorbid illnesses. Continue mechanical ventilation per ARDS protocol Target TVol 6-8cc/kgIBW Target Plateau Pressure < 30cm H20 Target driving pressure less than 15 cm of water Target PaO2 55-65: titrate PEEP/FiO2 per protocol As long as PaO2 to FiO2 ratio is less than 1:150 position in prone position for 16 hours a day Check CVP daily if CVL in place Target CVP less than 4, diurese as necessary Ventilator associated pneumonia prevention protocol 8/2 plan: attempt pressure support wean  Need for sedation/mechanical ventilation Change RASS target to -1 to -2  PAD protocol: wean off fentanyl, versed, propofol  Hypotension due to propofol: resolved Monitor hemodynamics   Best practice:  Diet: tube feeding Pain/Anxiety/Delirium protocol (if indicated): as above VAP protocol (if indicated): yes DVT prophylaxis: lovenox GI prophylaxis: Pantoprazole for stress ulcer prophylaxis Glucose control: SSI Mobility: bed rest Code Status: full Family Communication: will discuss with TR[facility] Disposition: remain in ICU  Labs   CBC: Recent Labs  Lab 04/11/19 2125 04/12/19 0639 04/12/19 0708 04/12/19 1151 04/13/19 0435  WBC 4.0  --  6.1  --  3.8*  NEUTROABS 3.5  --   --   --  2.9  HGB 14.5 12.6* 12.8* 13.6 12.9*  HCT 42.9 37.0* 38.0* 40.0 39.2  MCV 87.7  --  90.5  --  90.7  PLT 102*  --  102*  --  104*    Basic Metabolic Panel: Recent Labs  Lab 04/11/19 2125 04/12/19 0639 04/12/19 0708 04/12/19 1151 04/12/19 1610 04/13/19 0435  NA 132* 133*  --  133*  --  135  K 4.2 4.0  --  4.3  --  4.0  CL 100  --   --   --   --  104  CO2 22  --   --   --   --  22  GLUCOSE 220*  --   --   --   --  196*  BUN 20  --   --   --   --  44*   CREATININE 0.80  --  1.44*  --   --  1.74*  CALCIUM 8.4*  --   --   --   --  7.2*  MG  --   --  1.4*  --  1.6* 1.9  PHOS  --   --  4.1  --  4.2 4.0   GFR: Estimated Creatinine Clearance: 54 mL/min (A) (by C-G formula based on SCr of 1.74 mg/dL (H)). Recent Labs  Lab 04/11/19 2125 04/12/19 0708 04/13/19 0435  PROCALCITON  --  0.32  --   WBC 4.0 6.1 3.8*  LATICACIDVEN 1.2  --   --     Liver Function Tests: Recent Labs  Lab 04/11/19 2125 04/13/19 0435  AST 33 63*  ALT 33 48*  ALKPHOS 66 60  BILITOT 0.4 0.5  PROT 6.7 5.7*  ALBUMIN 3.0* 2.3*   No results for input(s): LIPASE, AMYLASE in the last 168 hours. No results for input(s): AMMONIA in the last 168 hours.  ABG    Component Value Date/Time   PHART 7.341 (L) 04/12/2019 1151   PCO2ART 37.4 04/12/2019 1151   PO2ART 125.0 (H) 04/12/2019 1151   HCO3 20.1 04/12/2019 1151   TCO2 21 (L) 04/12/2019 1151   ACIDBASEDEF 5.0 (H) 04/12/2019 1151   O2SAT 99.0 04/12/2019 1151     Coagulation Profile: Recent Labs  Lab 04/11/19 2125  INR 1.1    Cardiac Enzymes: No results for input(s): CKTOTAL, CKMB, CKMBINDEX, TROPONINI in the last 168 hours.  HbA1C: Hgb A1c MFr Bld  Date/Time Value Ref Range Status  04/11/2019 09:50 PM 7.4 (H) 4.8 - 5.6 % Final    Comment:    (NOTE) Pre diabetes:          5.7%-6.4% Diabetes:              >6.4% Glycemic control for   <7.0% adults with diabetes   06/07/2017 12:02 AM 9.1 (H) 4.8 - 5.6 % Final    Comment:    (NOTE) Pre diabetes:          5.7%-6.4% Diabetes:              >6.4% Glycemic control for   <7.0% adults with diabetes     CBG: Recent Labs  Lab 04/12/19 1528 04/12/19 1934 04/12/19 2338 04/13/19 0429 04/13/19 0730  GLUCAP 254* 255* 242* 180* 181*     Critical care time: 33 minutes    Roselie Awkward, MD [place] PCCM Pager: [phone] Cell: [phone] If no response, call [phone]

## 2019-04-13 NOTE — Procedures (Signed)
**Note De-Identified [place] Obfuscation** Extubation Procedure Note  Patient Details:   Name: JADARRIUS MASELLI DOB: [birth year] MRN: [id number]   Airway Documentation:    Vent end date: 04/13/19 Vent end time: 1623   Evaluation  O2 sats: stable throughout Complications: No apparent complications Patient did tolerate procedure well. Bilateral Breath Sounds: Diminished   Yes + leak, no stridor noted, VS and parameters WNL Wonder Donaway, Penni Bombard 04/13/2019, 4:24 PM

## 2019-04-13 NOTE — Progress Notes (Signed)
Spoke with daughter Maudie Mercury. Update given and questions answered.

## 2019-04-13 NOTE — Progress Notes (Signed)
[facility] PCCM  Passing SBT Follows commands Extubate  Roselie Awkward, MD [place] PCCM Pager: [phone] Cell: [phone] If no response, call [phone]

## 2019-04-13 NOTE — Progress Notes (Signed)
PROGRESS NOTE    Robert [name]  QMG:[id number] DOB: [birth year] DOA: 04/11/2019 PCP: Redmond School, MD    Brief Narrative:  68 year old male with a history of COPD, coronary artery disease, diabetes, ischemic cardiomyopathy, rheumatoid arthritis, presents to the hospital with complaints of progressive worsening shortness of breath.  He was mildly febrile and had worsening shortness of breath so he called EMS.  In the ER, he was noted to be substantially hypoxic, had mental status changes.  He tested positive for COVID-19.  He was intubated in the emergency room for persistent hypoxia.  He has been transferred to [facility] for further management.   Assessment & Plan:   Active Problems:   Rheumatoid arthritis ([place])   COPD (chronic obstructive pulmonary disease) (HCC)   Controlled type 2 diabetes mellitus with diabetic peripheral angiopathy without gangrene, with long-term current use of insulin (HCC)   Essential hypertension   Ischemic cardiomyopathy   Coronary artery disease   Dyslipidemia (high LDL; low HDL)   COVID-19 virus detected   Acute respiratory distress syndrome (ARDS) due to COVID-19 virus   1. Acute respiratory failure with hypoxia secondary to COVID-19 pneumonia.  Patient is currently intubated and mechanically ventilated.  He has been started on a course Remdesivir.  He received a dose of Actemra on 8/1.  He is also been started on dexamethasone.  Chest x-ray shows bilateral infiltrates.  Procalcitonin is mildly elevated.  He is currently on cefepime.  Sputum culture in process 2. Hypotension.  Briefly required vasopressors, but have since been weaned off.  Possibly related to propofol which has been weaned off.  He is on multiple antihypertensives for cardiomyopathy which are currently held.  Continue to follow. 3. Acute kidney injury.  Suspect this is secondary to hypotension as well as recent ARB/Lasix use.  Continue gentle hydration.  Follow urine output. 4.  Rheumatoid arthritis.  Home medications indicate that he is chronically on methotrexate and Actemra. 5. Ischemic cardiomyopathy.  Last ejection fraction from 2019 was reportedly normal.  He is on nitrates, ARB, beta blockers, and daily Lasix all of which are on hold due to hypotension. 6. Diabetes.  Continue decreased dose of basal insulin.  Follow blood sugars and sliding scale.  Hold oral agents. Currently blood sugars are stable 7. Coronary artery disease with history of stents.  Continue on aspirin and Plavix for now. 8. Thrombocytopenia.  Appears to be present since 2018.  Near baseline.  Continue to follow. 9. COPD.  No evidence of wheezing at this time.  He is on steroids.  Continue to monitor.   DVT prophylaxis: Lovenox 40 mg every 12 hours Code Status: Full code Family Communication: discussed with daughter, Maudie Mercury 8/2 Disposition Plan: Continue monitoring in the ICU   Consultants:   PCCM  Procedures:   Intubation 7/31 >  Left subclavian central line 8/1 >  Antimicrobials:   Cefepime 7/31 >   Subjective: No acute events overnight. Remains on vent  Objective: Vitals:   04/13/19 1130 04/13/19 1200 04/13/19 1230 04/13/19 1300  BP: 126/73 123/76 117/69 121/70  Pulse: 93 89 88 88  Resp: (!) 28 11 11 12   Temp: 99.7 F (37.6 C) 99.5 F (37.5 C) 99.5 F (37.5 C) 99.5 F (37.5 C)  TempSrc:  Bladder    SpO2: 99% 99% 98% 98%  Weight:      Height:        Intake/Output Summary (Last 24 hours) at 04/13/2019 1433 Last data filed at 04/13/2019 1400 Gross  per 24 hour  Intake 3720.53 ml  Output 1040 ml  Net 2680.53 ml   Filed Weights   04/11/19 2115 04/13/19 0500  Weight: 102.1 kg 109 kg    Examination:  General exam: intubated and sedated Respiratory system: Clear to auscultation. Respiratory effort normal. Cardiovascular system:RRR. No murmurs, rubs, gallops. Gastrointestinal system: Abdomen is nondistended, soft and nontender. No organomegaly or masses felt.  Normal bowel sounds heard. Central nervous system: unable to assess due to sedation Extremities: No C/C/E, +pedal pulses Skin: No rashes, lesions or ulcers Psychiatry: sedated.    Data Reviewed: I have personally reviewed following labs and imaging studies  CBC: Recent Labs  Lab 04/11/19 2125 04/12/19 0639 04/12/19 0708 04/12/19 1151 04/13/19 0435  WBC 4.0  --  6.1  --  3.8*  NEUTROABS 3.5  --   --   --  2.9  HGB 14.5 12.6* 12.8* 13.6 12.9*  HCT 42.9 37.0* 38.0* 40.0 39.2  MCV 87.7  --  90.5  --  90.7  PLT 102*  --  102*  --  540*   Basic Metabolic Panel: Recent Labs  Lab 04/11/19 2125 04/12/19 0639 04/12/19 0708 04/12/19 1151 04/12/19 1610 04/13/19 0435  NA 132* 133*  --  133*  --  135  K 4.2 4.0  --  4.3  --  4.0  CL 100  --   --   --   --  104  CO2 22  --   --   --   --  22  GLUCOSE 220*  --   --   --   --  196*  BUN 20  --   --   --   --  44*  CREATININE 0.80  --  1.44*  --   --  1.74*  CALCIUM 8.4*  --   --   --   --  7.2*  MG  --   --  1.4*  --  1.6* 1.9  PHOS  --   --  4.1  --  4.2 4.0   GFR: Estimated Creatinine Clearance: 54 mL/min (A) (by C-G formula based on SCr of 1.74 mg/dL (H)). Liver Function Tests: Recent Labs  Lab 04/11/19 2125 04/13/19 0435  AST 33 63*  ALT 33 48*  ALKPHOS 66 60  BILITOT 0.4 0.5  PROT 6.7 5.7*  ALBUMIN 3.0* 2.3*   No results for input(s): LIPASE, AMYLASE in the last 168 hours. No results for input(s): AMMONIA in the last 168 hours. Coagulation Profile: Recent Labs  Lab 04/11/19 2125  INR 1.1   Cardiac Enzymes: No results for input(s): CKTOTAL, CKMB, CKMBINDEX, TROPONINI in the last 168 hours. BNP (last 3 results) No results for input(s): PROBNP in the last 8760 hours. HbA1C: Recent Labs    04/11/19 2150  HGBA1C 7.4*   CBG: Recent Labs  Lab 04/12/19 1528 04/12/19 1934 04/12/19 2338 04/13/19 0429 04/13/19 0730  GLUCAP 254* 255* 242* 180* 181*   Lipid Profile: Recent Labs    04/12/19 0249  TRIG  184*   Thyroid Function Tests: No results for input(s): TSH, T4TOTAL, FREET4, T3FREE, THYROIDAB in the last 72 hours. Anemia Panel: Recent Labs    04/12/19 0708  FERRITIN 495*   Sepsis Labs: Recent Labs  Lab 04/11/19 2125 04/12/19 0708  PROCALCITON  --  0.32  LATICACIDVEN 1.2  --     Recent Results (from the past 240 hour(s))  Novel Coronavirus, NAA ([facility])     Status: Abnormal   Collection  Time: 04/11/19  8:21 AM   Specimen: Nasal Swab  Result Value Ref Range Status   SARS-CoV-2, NAA Detected (A) Not Detected Final    Comment: This test was developed and its performance characteristics determined by [employer]. This test has not been FDA cleared or approved. This test has been authorized by FDA under an Emergency Use Authorization (EUA). This test is only authorized for the duration of time the declaration that circumstances exist justifying the authorization of the emergency use of in vitro diagnostic tests for detection of SARS-CoV-2 virus and/or diagnosis of COVID-19 infection under section 564(b)(1) of the Act, 21 U.S.C. 628BTD-1(V)(6), unless the authorization is terminated or revoked sooner. When diagnostic testing is negative, the possibility of a false negative result should be considered in the context of a patient's recent exposures and the presence of clinical signs and symptoms consistent with COVID-19. An individual without symptoms of COVID-19 and who is not shedding SARS-CoV-2 virus would expect to have a negative (not detected) result in this assay.   Blood Culture (routine x 2)     Status: None (Preliminary result)   Collection Time: 04/11/19  9:25 PM   Specimen: BLOOD  Result Value Ref Range Status   Specimen Description BLOOD  Final   Special Requests NONE  Final   Culture   Final    NO GROWTH 2 DAYS Performed at [facility], [address], [place], [place] [address]    Report Status PENDING  Incomplete  Urine culture     Status:  None   Collection Time: 04/11/19  9:25 PM   Specimen: In/Out Cath Urine  Result Value Ref Range Status   Specimen Description   Final    IN/OUT CATH URINE Performed at [facility], [address], [place], [place] [address]    Special Requests   Final    NONE Performed at [facility], [address], [place], [place] [address]    Culture   Final    NO GROWTH Performed at [facility], [place] [address], [place], [place] [address]    Report Status 04/13/2019 FINAL  Final  Blood Culture (routine x 2)     Status: None (Preliminary result)   Collection Time: 04/11/19  9:45 PM   Specimen: BLOOD  Result Value Ref Range Status   Specimen Description BLOOD  Final   Special Requests NONE  Final   Culture   Final    NO GROWTH 2 DAYS Performed at [facility], [address] [place], [place] [address]    Report Status PENDING  Incomplete  [facility] order, Performed in [facility] hospital lab) Nasopharyngeal Nasopharyngeal Swab     Status: Abnormal   Collection Time: 04/11/19  9:50 PM   Specimen: Nasopharyngeal Swab  Result Value Ref Range Status   SARS Coronavirus 2 POSITIVE (A) NEGATIVE Final    Comment: RESULT CALLED TO, READ BACK BY AND VERIFIED WITH: TURNER,C. AT 2356 ON 04/11/2019 (NOTE) If result is NEGATIVE SARS-CoV-2 target nucleic acids are NOT DETECTED. The SARS-CoV-2 RNA is generally detectable in upper and lower  respiratory specimens during the acute phase of infection. The lowest  concentration of SARS-CoV-2 viral copies this assay can detect is 250  copies / mL. A negative result does not preclude SARS-CoV-2 infection  and should not be used as the sole basis for treatment or other  patient management decisions.  A negative result may occur with  improper specimen collection / handling, submission  of specimen other  than nasopharyngeal swab, presence of viral mutation(s) within the  areas targeted by this assay, and inadequate number of viral  copies  (<250 copies / mL). A negative result must be combined with clinical  observations, patient history, and epidemiological information. If result is POSITIVE SARS-CoV-2 target nucleic acids are DETECTED. The S ARS-CoV-2 RNA is generally detectable in upper and lower  respiratory specimens during the acute phase of infection.  Positive  results are indicative of active infection with SARS-CoV-2.  Clinical  correlation with patient history and other diagnostic information is  necessary to determine patient infection status.  Positive results do  not rule out bacterial infection or co-infection with other viruses. If result is PRESUMPTIVE POSTIVE SARS-CoV-2 nucleic acids MAY BE PRESENT.   A presumptive positive result was obtained on the submitted specimen  and confirmed on repeat testing.  While 2019 novel coronavirus  (SARS-CoV-2) nucleic acids may be present in the submitted sample  additional confirmatory testing may be necessary for epidemiological  and / or clinical management purposes  to differentiate between  SARS-CoV-2 and other Sarbecovirus currently known to infect humans.  If clinically indicated additional testing with an alternate test  methodology [phone]) is adv ised. The SARS-CoV-2 RNA is generally  detectable in upper and lower respiratory specimens during the acute  phase of infection. The expected result is Negative. Fact Sheet for Patients:  StrictlyIdeas.no Fact Sheet for Healthcare Providers: BankingDealers.co.za This test is not yet approved or cleared by the [place] FDA and has been authorized for detection and/or diagnosis of SARS-CoV-2 by FDA under an Emergency Use Authorization (EUA).  This EUA will remain in effect (meaning this test can be used) for the duration of the COVID-19 declaration under Section 564(b)(1) of the Act, 21 U.S.C. section 360bbb-3(b)(1), unless the authorization is terminated  or revoked sooner. Performed at [facility], [address], [place], [place] [address]   MRSA PCR Screening     Status: None   Collection Time: 04/12/19  6:56 AM   Specimen: Nasal Mucosa; Nasopharyngeal  Result Value Ref Range Status   MRSA by PCR NEGATIVE NEGATIVE Final    Comment:        The GeneXpert MRSA Assay (FDA approved for NASAL specimens only), is one component of a comprehensive MRSA colonization surveillance program. It is not intended to diagnose MRSA infection nor to guide or monitor treatment for MRSA infections. Performed at [facility], [place] [address] [place], [place] [address]   Expectorated sputum assessment w rflx to resp cult     Status: None   Collection Time: 04/12/19  3:56 PM   Specimen: Tracheal Aspirate; Sputum  Result Value Ref Range Status   Specimen Description TRACHEAL ASPIRATE  Final   Special Requests NONE  Final   Sputum evaluation   Final    THIS SPECIMEN IS ACCEPTABLE FOR SPUTUM CULTURE Performed at [facility], [place] [address] [place], [place] [address]    Report Status 04/12/2019 FINAL  Final  Culture, respiratory     Status: None (Preliminary result)   Collection Time: 04/12/19  3:56 PM   Specimen: Tracheal Aspirate  Result Value Ref Range Status   Specimen Description   Final    TRACHEAL ASPIRATE Performed at [place] [address], [place], [place] [address]    Special Requests   Final    NONE Reflexed from [phone] Performed at [facility], [place] [address]  [place], [place] [address]    Gram Stain   Final    MODERATE WBC PRESENT, PREDOMINANTLY MONONUCLEAR RARE SQUAMOUS EPITHELIAL CELLS PRESENT RARE GRAM POSITIVE COCCI IN CLUSTERS RARE BUDDING YEAST SEEN    Culture   Final    NO GROWTH < 24 HOURS Performed at [facility], [place] [address], [place], [place] [address]    Report Status PENDING  Incomplete         Radiology Studies:  Dg Chest Port 1 View  Result Date: 04/12/2019 CLINICAL DATA:  Central line placement EXAM: PORTABLE CHEST 1 VIEW COMPARISON:  April 12, 2019 4 a.m. FINDINGS: The mediastinal contour and cardiac silhouette are stable. Endotracheal tube is identified with distal tip 5.1 cm from carina. Nasogastric tube is identified distal tip not included on film. A left central venous line is identified distal tip in the superior vena cava. There is no pneumothorax. Patchy consolidation of throughout the left lung and to a much lesser degree in the right lung base are decreased compared to prior exam. IMPRESSION: Left central venous line with distal tip in the superior vena cava. There is no pneumothorax. Consolidation of bilateral lungs are decreased compared prior exam. Electronically Signed   By: Abelardo Diesel M.D.   On: 04/12/2019 11:52   Dg Chest Portable 1 View  Result Date: 04/12/2019 CLINICAL DATA:  Status post intubation. EXAM: PORTABLE CHEST 1 VIEW COMPARISON:  Chest radiograph 04/12/2019 FINDINGS: Monitoring leads overlie the patient. ET tube terminates in the distal trachea. Enteric tube courses inferior to the diaphragm. Stable cardiac and mediastinal contours. Similar-appearing diffuse bilateral areas of consolidation. Probable small left pleural effusion. IMPRESSION: ET tube terminates in the distal trachea. Similar multifocal areas of consolidation. Electronically Signed   By: Lovey Newcomer M.D.   On: 04/12/2019 04:45   Dg Chest Portable 1 View  Result Date: 04/12/2019 CLINICAL DATA:  Post intubation EXAM: PORTABLE CHEST 1 VIEW COMPARISON:  April 11, 2019 FINDINGS: The endotracheal tube terminates approximately 4 cm above the carina. Bilateral multifocal airspace opacities are again noted. There is no pneumothorax. No large pleural effusion. Heart size is unchanged from prior. Aortic calcifications are again noted. IMPRESSION: 1. Endotracheal tube as above. 2. Multifocal airspace opacities consistent with a  history of viral pneumonia. Electronically Signed   By: Constance Holster M.D.   On: 04/12/2019 02:45   Dg Chest Port 1 View  Result Date: 04/11/2019 CLINICAL DATA:  Dry cough and shortness of breath over the last 2 days. EXAM: PORTABLE CHEST 1 VIEW COMPARISON:  08/26/2018 FINDINGS: Bilateral bronchopneumonia, worse on the left than the right. Left disease is more pronounced in the mid and lower lung and right lung disease is more pronounced in the lower lung. Small amount of effusion on the left. This could be a pattern of bacterial or viral pneumonia. Heart size is normal with left ventricular prominence. There are coronary artery stents. Aortic atherosclerosis is present. IMPRESSION: Bilateral bronchopneumonia left worse than right. This could be due to bacterial or viral pneumonia. Electronically Signed   By: Nelson Chimes M.D.   On: 04/11/2019 21:55        Scheduled Meds: . aspirin  81 mg Oral Daily  . chlorhexidine gluconate (MEDLINE KIT)  15 mL Mouth Rinse BID  . Chlorhexidine Gluconate Cloth  6 each Topical Daily  . clopidogrel  75 mg Per Tube Daily  . dexamethasone (DECADRON) injection  6 mg Intravenous Q24H  . enoxaparin (LOVENOX) injection  40 mg Subcutaneous  Q12H  . feeding supplement (PRO-STAT SUGAR FREE 64)  60 mL Per Tube TID  . feeding supplement (VITAL HIGH PROTEIN)  1,000 mL Per Tube Q24H  . fentaNYL (SUBLIMAZE) injection  50 mcg Intravenous Once  . folic acid  1 mg Per Tube Daily  . free water  100 mL Per Tube Q6H  . insulin aspart  0-20 Units Subcutaneous Q4H  . insulin glargine  15 Units Subcutaneous BID  . mouth rinse  15 mL Mouth Rinse 10 times per day  . multivitamin  15 mL Per Tube Daily  . pantoprazole sodium  40 mg Per Tube Daily   Continuous Infusions: . sodium chloride 75 mL/hr at 04/12/19 0142  . ceFEPime (MAXIPIME) IV 2 g (04/13/19 1408)  . fentaNYL infusion INTRAVENOUS 100 mcg/hr (04/13/19 1400)  . midazolam 4 mg/hr (04/13/19 1400)  . phenylephrine  (NEO-SYNEPHRINE) Adult infusion Stopped (04/13/19 0503)  . propofol (DIPRIVAN) infusion Stopped (04/12/19 2000)  . remdesivir 100 mg in NS 250 mL Stopped (04/13/19 0939)     LOS: 1 day    Critical care time: 46 minutes    Kathie Dike, MD [facility]   If 7PM-7AM, please contact night-coverage [email or web address]  04/13/2019, 2:33 PM

## 2019-04-13 NOTE — Progress Notes (Signed)
Initial Nutrition Assessment  RD working remotely.   DOCUMENTATION CODES:   Obesity unspecified  INTERVENTION:  - will adjust TF regimen: Vital High Protein @ 40 ml/hr with 60 ml prostat TID and 100 ml free water QID. - this regimen will provide 1560 kcal (102% estimated kcal need), 174 grams protien, and 1202 ml free water.  - will order liquid multivitamin per OGT for TF rate <42 ml/hr.    NUTRITION DIAGNOSIS:   Inadequate oral intake related to inability to eat as evidenced by NPO status.  GOAL:   Provide needs based on ASPEN/SCCM guidelines  MONITOR:   Vent status, TF tolerance, Labs, Weight trends  REASON FOR ASSESSMENT:   Ventilator, Consult Enteral/tube feeding initiation and management  ASSESSMENT:   68 year old male with a history of COPD, CAD, DM, ischemic cardiomyopathy, and rheumatoid arthritis. He presented to the hospital with complaints of progressive worsening shortness of breath. He was mildly febrile and had worsening shortness of breath so he called EMS. In the ED, he was noted to be substantially hypoxic, had mental status changes, and he tested positive for COVID-19. He was intubated in the ED d/t persistent hypoxia and was then transferred to [facility] for further management.  Patient has been intubated since admission to [facility]. OGT in place and he is receiving Vital High Protein @ 40 ml/hr with 30 ml prostat BID which provides 1160 kcal, 114 grams protein, and 802 ml free water. Will adjust TF regimen as outlined above.   Per chart review, current weight is 240 lb and despite a few fluctuations, it appears that weight has been mainly stable since 06/20/17.  Per notes: - COVID-19 PNA with CXR showing bilateral infiltrates - hypotension--thought possibly 2/2 propofol (which is now off)   Patient is currently intubated on ventilator support MV: 8.9 L/min Temp (24hrs), Avg:99.4 F (37.4 C), Min:98.4 F (36.9 C), Max:100.6 F (38.1  C) Propofol: none  Labs reviewed; CBGs: 180 and 181 mg/dl today, BUN: 44 mg/dl, creatinine: 1.74 mg/dl, Ca: 7.2 mg/dl, LFTs slightly elevated, GFR: 40 ml/min. Medications reviewed; 1 mg folvite/day, sliding scale novolog, 15 units lantus BID, 200 mg IV remdesivir x1 dose 8/1, 100 mg IV remdesivir x1 dose/day x4 days starting 8/2, 800 mg IV actemra x1 dose 8/1. IVF; NS @ 75 ml/hr. Drips; fentanyl @ 100 mcg/hr, versed @ 4 mg/hr.     NUTRITION - FOCUSED PHYSICAL EXAM:  unable to complete while patient is at [facility].  Diet Order:   Diet Order    None      EDUCATION NEEDS:   No education needs have been identified at this time  Skin:  Skin Assessment: Reviewed RN Assessment  Last BM:  8/1  Height:   Ht Readings from Last 1 Encounters:  04/12/19 6' (1.829 m)    Weight:   Wt Readings from Last 1 Encounters:  04/13/19 109 kg    Ideal Body Weight:  80.9 kg  BMI:  Body mass index is 32.59 kg/m.  Estimated Nutritional Needs:   Kcal:  1200-1526 kcal  Protein:  >/= 162 grams  Fluid:  >/= 2 L/day     Jarome Matin, MS, RD, LDN, [facility] Inpatient Clinical Dietitian Pager # [phone] After hours/weekend pager # [phone]

## 2019-04-14 ENCOUNTER — Inpatient Hospital Stay (HOSPITAL_COMMUNITY): Payer: Medicare HMO

## 2019-04-14 LAB — POCT I-STAT 7, (LYTES, BLD GAS, ICA,H+H)
Acid-base deficit: 5 mmol/L — ABNORMAL HIGH (ref 0.0–2.0)
Bicarbonate: 23.9 mmol/L (ref 20.0–28.0)
Calcium, Ion: 1.14 mmol/L — ABNORMAL LOW (ref 1.15–1.40)
HCT: 44 % (ref 39.0–52.0)
Hemoglobin: 15 g/dL (ref 13.0–17.0)
O2 Saturation: 89 %
Patient temperature: 98.6
Potassium: 4.2 mmol/L (ref 3.5–5.1)
Sodium: 142 mmol/L (ref 135–145)
TCO2: 26 mmol/L (ref 22–32)
pCO2 arterial: 61.1 mmHg — ABNORMAL HIGH (ref 32.0–48.0)
pH, Arterial: 7.2 — ABNORMAL LOW (ref 7.350–7.450)
pO2, Arterial: 70 mmHg — ABNORMAL LOW (ref 83.0–108.0)

## 2019-04-14 LAB — CBC WITH DIFFERENTIAL/PLATELET
Abs Immature Granulocytes: 0.04 10*3/uL (ref 0.00–0.07)
Basophils Absolute: 0 10*3/uL (ref 0.0–0.1)
Basophils Relative: 1 %
Eosinophils Absolute: 0 10*3/uL (ref 0.0–0.5)
Eosinophils Relative: 0 %
HCT: 41.6 % (ref 39.0–52.0)
Hemoglobin: 13.8 g/dL (ref 13.0–17.0)
Immature Granulocytes: 1 %
Lymphocytes Relative: 16 %
Lymphs Abs: 0.8 10*3/uL (ref 0.7–4.0)
MCH: 30.1 pg (ref 26.0–34.0)
MCHC: 33.2 g/dL (ref 30.0–36.0)
MCV: 90.8 fL (ref 80.0–100.0)
Monocytes Absolute: 0.4 10*3/uL (ref 0.1–1.0)
Monocytes Relative: 9 %
Neutro Abs: 3.6 10*3/uL (ref 1.7–7.7)
Neutrophils Relative %: 73 %
Platelets: 120 10*3/uL — ABNORMAL LOW (ref 150–400)
RBC: 4.58 MIL/uL (ref 4.22–5.81)
RDW: 15.7 % — ABNORMAL HIGH (ref 11.5–15.5)
WBC: 4.9 10*3/uL (ref 4.0–10.5)
nRBC: 0 % (ref 0.0–0.2)

## 2019-04-14 LAB — GLUCOSE, CAPILLARY
Glucose-Capillary: 106 mg/dL — ABNORMAL HIGH (ref 70–99)
Glucose-Capillary: 110 mg/dL — ABNORMAL HIGH (ref 70–99)
Glucose-Capillary: 172 mg/dL — ABNORMAL HIGH (ref 70–99)
Glucose-Capillary: 184 mg/dL — ABNORMAL HIGH (ref 70–99)
Glucose-Capillary: 228 mg/dL — ABNORMAL HIGH (ref 70–99)
Glucose-Capillary: 254 mg/dL — ABNORMAL HIGH (ref 70–99)
Glucose-Capillary: 267 mg/dL — ABNORMAL HIGH (ref 70–99)

## 2019-04-14 LAB — PHOSPHORUS: Phosphorus: 3.2 mg/dL (ref 2.5–4.6)

## 2019-04-14 LAB — COMPREHENSIVE METABOLIC PANEL
ALT: 87 U/L — ABNORMAL HIGH (ref 0–44)
AST: 104 U/L — ABNORMAL HIGH (ref 15–41)
Albumin: 2.4 g/dL — ABNORMAL LOW (ref 3.5–5.0)
Alkaline Phosphatase: 65 U/L (ref 38–126)
Anion gap: 8 (ref 5–15)
BUN: 48 mg/dL — ABNORMAL HIGH (ref 8–23)
CO2: 23 mmol/L (ref 22–32)
Calcium: 7.4 mg/dL — ABNORMAL LOW (ref 8.9–10.3)
Chloride: 112 mmol/L — ABNORMAL HIGH (ref 98–111)
Creatinine, Ser: 1.54 mg/dL — ABNORMAL HIGH (ref 0.61–1.24)
GFR calc Af Amer: 54 mL/min — ABNORMAL LOW (ref >60)
GFR calc non Af Amer: 47 mL/min — ABNORMAL LOW (ref >60)
Glucose, Bld: 128 mg/dL — ABNORMAL HIGH (ref 70–99)
Potassium: 4 mmol/L (ref 3.5–5.1)
Sodium: 143 mmol/L (ref 135–145)
Total Bilirubin: 0.4 mg/dL (ref 0.3–1.2)
Total Protein: 6 g/dL — ABNORMAL LOW (ref 6.5–8.1)

## 2019-04-14 LAB — C-REACTIVE PROTEIN: CRP: 9.4 mg/dL — ABNORMAL HIGH (ref <1.0)

## 2019-04-14 LAB — PROCALCITONIN: Procalcitonin: 0.21 ng/mL

## 2019-04-14 LAB — MAGNESIUM: Magnesium: 2.2 mg/dL (ref 1.7–2.4)

## 2019-04-14 MED ORDER — FENTANYL 2500MCG IN NS 250ML (10MCG/ML) PREMIX INFUSION
50.0000 ug/h | INTRAVENOUS | Status: DC
  Administered 2019-04-14: 50 ug/h via INTRAVENOUS
  Administered 2019-04-14: 150 ug/h via INTRAVENOUS
  Filled 2019-04-14 (×2): qty 250

## 2019-04-14 MED ORDER — MIDAZOLAM HCL 2 MG/2ML IJ SOLN
2.0000 mg | Freq: Once | INTRAMUSCULAR | Status: AC
  Administered 2019-04-14: 10:00:00 2 mg via INTRAVENOUS

## 2019-04-14 MED ORDER — HEPARIN (PORCINE) 25000 UT/250ML-% IV SOLN
1150.0000 [IU]/h | INTRAVENOUS | Status: DC
  Administered 2019-04-15: 02:00:00 1400 [IU]/h via INTRAVENOUS
  Administered 2019-04-15: 19:00:00 1300 [IU]/h via INTRAVENOUS
  Administered 2019-04-16: 18:00:00 1150 [IU]/h via INTRAVENOUS
  Filled 2019-04-14 (×3): qty 250

## 2019-04-14 MED ORDER — FENTANYL BOLUS VIA INFUSION
50.0000 ug | INTRAVENOUS | Status: DC | PRN
  Administered 2019-04-14 – 2019-04-15 (×2): 50 ug via INTRAVENOUS
  Filled 2019-04-14: qty 50

## 2019-04-14 MED ORDER — BUDESONIDE 0.5 MG/2ML IN SUSP
0.5000 mg | Freq: Two times a day (BID) | RESPIRATORY_TRACT | Status: DC
  Administered 2019-04-14 – 2019-05-03 (×39): 0.5 mg via RESPIRATORY_TRACT
  Filled 2019-04-14 (×39): qty 2

## 2019-04-14 MED ORDER — ARFORMOTEROL TARTRATE 15 MCG/2ML IN NEBU
15.0000 ug | INHALATION_SOLUTION | Freq: Two times a day (BID) | RESPIRATORY_TRACT | Status: DC
  Administered 2019-04-15 – 2019-05-03 (×36): 15 ug via RESPIRATORY_TRACT
  Filled 2019-04-14 (×40): qty 2

## 2019-04-14 MED ORDER — IPRATROPIUM-ALBUTEROL 0.5-2.5 (3) MG/3ML IN SOLN
3.0000 mL | RESPIRATORY_TRACT | Status: DC
  Administered 2019-04-14 (×3): 3 mL via RESPIRATORY_TRACT
  Filled 2019-04-14 (×3): qty 3

## 2019-04-14 MED ORDER — PRO-STAT SUGAR FREE PO LIQD
30.0000 mL | Freq: Two times a day (BID) | ORAL | Status: DC
  Administered 2019-04-14: 30 mL
  Filled 2019-04-14: qty 30

## 2019-04-14 MED ORDER — DILTIAZEM HCL 25 MG/5ML IV SOLN
15.0000 mg | Freq: Once | INTRAVENOUS | Status: AC
  Administered 2019-04-14: 23:00:00 15 mg via INTRAVENOUS
  Filled 2019-04-14: qty 5

## 2019-04-14 MED ORDER — FUROSEMIDE 10 MG/ML IJ SOLN
40.0000 mg | Freq: Once | INTRAMUSCULAR | Status: AC
  Administered 2019-04-14: 40 mg via INTRAVENOUS
  Filled 2019-04-14: qty 4

## 2019-04-14 MED ORDER — FENTANYL CITRATE (PF) 100 MCG/2ML IJ SOLN
50.0000 ug | Freq: Once | INTRAMUSCULAR | Status: AC
  Administered 2019-04-14: 10:00:00 50 ug via INTRAVENOUS

## 2019-04-14 MED ORDER — FENTANYL CITRATE (PF) 100 MCG/2ML IJ SOLN: 50.0000 ug | Freq: Once | INTRAMUSCULAR | Status: AC

## 2019-04-14 MED ORDER — ETOMIDATE 2 MG/ML IV SOLN
20.0000 mg | Freq: Once | INTRAVENOUS | Status: AC
  Administered 2019-04-14: 10:00:00 20 mg via INTRAVENOUS

## 2019-04-14 MED ORDER — CLONAZEPAM 1 MG PO TABS
2.0000 mg | ORAL_TABLET | Freq: Two times a day (BID) | ORAL | Status: DC
  Administered 2019-04-14 – 2019-04-21 (×14): 2 mg
  Filled 2019-04-14 (×16): qty 2

## 2019-04-14 MED ORDER — ETOMIDATE 2 MG/ML IV SOLN
INTRAVENOUS | Status: AC
  Filled 2019-04-14: qty 20

## 2019-04-14 MED ORDER — FUROSEMIDE 10 MG/ML IJ SOLN
60.0000 mg | Freq: Once | INTRAMUSCULAR | Status: AC
  Administered 2019-04-14: 60 mg via INTRAVENOUS
  Filled 2019-04-14: qty 6

## 2019-04-14 MED ORDER — ROCURONIUM BROMIDE 50 MG/5ML IV SOLN
100.0000 mg | Freq: Once | INTRAVENOUS | Status: AC
  Administered 2019-04-14: 10:00:00 100 mg via INTRAVENOUS
  Filled 2019-04-14: qty 10

## 2019-04-14 MED ORDER — IPRATROPIUM BROMIDE 0.02 % IN SOLN
0.5000 mg | Freq: Four times a day (QID) | RESPIRATORY_TRACT | Status: DC
  Administered 2019-04-15 – 2019-04-18 (×13): 0.5 mg via RESPIRATORY_TRACT
  Filled 2019-04-14 (×13): qty 2.5

## 2019-04-14 MED ORDER — HYDROMORPHONE HCL 2 MG PO TABS
2.0000 mg | ORAL_TABLET | Freq: Four times a day (QID) | ORAL | Status: DC
  Administered 2019-04-14 – 2019-04-20 (×22): 2 mg via ORAL
  Filled 2019-04-14 (×22): qty 1

## 2019-04-14 MED ORDER — PRO-STAT SUGAR FREE PO LIQD
30.0000 mL | Freq: Every day | ORAL | Status: DC
  Administered 2019-04-15 – 2019-04-20 (×6): 30 mL
  Filled 2019-04-14 (×6): qty 30

## 2019-04-14 MED ORDER — MIDAZOLAM HCL 2 MG/2ML IJ SOLN
INTRAMUSCULAR | Status: AC
  Filled 2019-04-14: qty 4

## 2019-04-14 MED ORDER — DEXMEDETOMIDINE HCL IN NACL 400 MCG/100ML IV SOLN
0.4000 ug/kg/h | INTRAVENOUS | Status: DC
  Administered 2019-04-14: 05:00:00 0.4 ug/kg/h via INTRAVENOUS
  Filled 2019-04-14: qty 100

## 2019-04-14 MED ORDER — VITAL AF 1.2 CAL PO LIQD
1000.0000 mL | ORAL | Status: DC
  Administered 2019-04-14 – 2019-04-23 (×6): 1000 mL
  Filled 2019-04-14 (×2): qty 1000

## 2019-04-14 MED ORDER — ORAL CARE MOUTH RINSE
15.0000 mL | OROMUCOSAL | Status: DC
  Administered 2019-04-14 – 2019-05-03 (×186): 15 mL via OROMUCOSAL

## 2019-04-14 MED ORDER — ROCURONIUM BROMIDE 10 MG/ML (PF) SYRINGE
PREFILLED_SYRINGE | INTRAVENOUS | Status: AC
  Filled 2019-04-14: qty 10

## 2019-04-14 MED ORDER — MAGNESIUM SULFATE 2 GM/50ML IV SOLN
2.0000 g | Freq: Once | INTRAVENOUS | Status: AC
  Administered 2019-04-14: 2 g via INTRAVENOUS
  Filled 2019-04-14: qty 50

## 2019-04-14 MED ORDER — CHLORHEXIDINE GLUCONATE 0.12% ORAL RINSE (MEDLINE KIT)
15.0000 mL | Freq: Two times a day (BID) | OROMUCOSAL | Status: DC
  Administered 2019-04-14 – 2019-05-03 (×39): 15 mL via OROMUCOSAL

## 2019-04-14 MED ORDER — DEXMEDETOMIDINE HCL IN NACL 400 MCG/100ML IV SOLN
0.0000 ug/kg/h | INTRAVENOUS | Status: AC
  Administered 2019-04-14 (×2): 0.8 ug/kg/h via INTRAVENOUS
  Administered 2019-04-14: 1 ug/kg/h via INTRAVENOUS
  Administered 2019-04-14: 10:00:00 0.6 ug/kg/h via INTRAVENOUS
  Administered 2019-04-14: 0.8 ug/kg/h via INTRAVENOUS
  Administered 2019-04-15 (×2): 0.4 ug/kg/h via INTRAVENOUS
  Administered 2019-04-15: 0.8 ug/kg/h via INTRAVENOUS
  Administered 2019-04-16: 0.6 ug/kg/h via INTRAVENOUS
  Administered 2019-04-16 (×2): 0.8 ug/kg/h via INTRAVENOUS
  Administered 2019-04-16: 0.6 ug/kg/h via INTRAVENOUS
  Administered 2019-04-17: 0.8 ug/kg/h via INTRAVENOUS
  Administered 2019-04-17: 0.3 ug/kg/h via INTRAVENOUS
  Filled 2019-04-14 (×14): qty 100

## 2019-04-14 MED ORDER — CLONAZEPAM 0.1 MG/ML ORAL SUSPENSION
2.0000 mg | Freq: Two times a day (BID) | ORAL | Status: DC
  Filled 2019-04-14: qty 20

## 2019-04-14 MED ORDER — VITAL HIGH PROTEIN PO LIQD: 1000.0000 mL | ORAL | Status: DC

## 2019-04-14 MED ORDER — ALBUTEROL SULFATE (2.5 MG/3ML) 0.083% IN NEBU: 2.5000 mg | INHALATION_SOLUTION | RESPIRATORY_TRACT | Status: DC | PRN

## 2019-04-14 MED ORDER — LEVALBUTEROL HCL 1.25 MG/0.5ML IN NEBU
1.2500 mg | INHALATION_SOLUTION | Freq: Four times a day (QID) | RESPIRATORY_TRACT | Status: DC
  Administered 2019-04-15 (×2): 1.25 mg via RESPIRATORY_TRACT
  Filled 2019-04-14 (×3): qty 0.5

## 2019-04-14 MED ORDER — FENTANYL CITRATE (PF) 100 MCG/2ML IJ SOLN
INTRAMUSCULAR | Status: AC
  Filled 2019-04-14: qty 2

## 2019-04-14 MED ORDER — ALBUTEROL SULFATE HFA 108 (90 BASE) MCG/ACT IN AERS
2.0000 | INHALATION_SPRAY | RESPIRATORY_TRACT | Status: DC | PRN
  Filled 2019-04-14: qty 6.7

## 2019-04-14 NOTE — Progress Notes (Signed)
SLP Cancellation Note  Patient Details Name: [name] MRN: [id number] DOB: 06-05-1951   Cancelled treatment:       Reason Eval/Treat Not Completed: Medical issues which prohibited therapy. Order received for swallow evaluation post-extubation, but pt has now been reintubated. Will f/u for readiness to try POs.    Venita Sheffield Kassidi Elza 04/14/2019, 1:17 PM  Pollyann Glen, M.A. [place] Acute Environmental education officer [phone] Office [phone]

## 2019-04-14 NOTE — Progress Notes (Signed)
Pt    PT Cancellation Note  Patient Details Name: Robert [name] MRN: [id number] DOB: 01/07/1951   Cancelled Treatment:     Patient has been reintubated. Please reorder PT when medically ready.   Claretha Cooper 04/14/2019, 1:22 PM

## 2019-04-14 NOTE — Progress Notes (Signed)
ANTICOAGULATION CONSULT NOTE - Initial Consult  Pharmacy Consult for heparin Indication: atrial fibrillation  Allergies  Allergen Reactions  . Ivp Dye [Iodinated Diagnostic Agents] Itching and Rash    Patient Measurements: Height: 6' (182.9 cm) Weight: 240 lb 4.8 oz (109 kg) IBW/kg (Calculated) : 77.6 Heparin Dosing Weight: 98.5 kg  Vital Signs: Temp: 98.1 F (36.7 C) (08/03 1600) Temp Source: Axillary (08/03 1600) BP: 130/71 (08/03 2000) Pulse Rate: 75 (08/03 2000)  Labs: Recent Labs    04/12/19 0249  04/12/19 0708  04/13/19 0435 04/14/19 0343 04/14/19 0520  HGB  --    < > 12.8*   < > 12.9* 15.0 13.8  HCT  --    < > 38.0*   < > 39.2 44.0 41.6  PLT  --   --  102*  --  104*  --  120*  CREATININE  --   --  1.44*  --  1.74*  --  1.54*  TROPONINIHS 34*  --   --   --   --   --   --    < > = values in this interval not displayed.    Estimated Creatinine Clearance: 61 mL/min (A) (by C-G formula based on SCr of 1.54 mg/dL (H)).   Medical History: Past Medical History:  Diagnosis Date  . Anxiety   . COPD (chronic obstructive pulmonary disease) ([place])   . Coronary artery disease   . Full dentures   . GERD (gastroesophageal reflux disease)    Rolaids as needed  . High cholesterol   . History of MI (myocardial infarction)   . Hypertension    med. dosage increased 04/2016; has been on BP med. x 10 yrs.  . Incarcerated umbilical hernia 58/5277  . Inguinal hernia 05/2016   bilateral   . Insulin dependent diabetes mellitus ([place])   . Ischemic cardiomyopathy    a. 03/2015 EF 40-45% by LV gram.  . Left leg cellulitis 10/08/2016  . Rheumatoid arthritis(714.0)    Assessment: 68 yo M with new onset Afib. Has been on Lovenox for DVT px. Now to switch to heparin. Last Lovenox 40mg  dose was at 2100. Hgb stable, plts low but stable at 120.  Goal of Therapy:  Heparin level 0.3-0.7 units/ml Monitor platelets by anticoagulation protocol: Yes   Plan:  No heparin bolus Start  heparin gtt at 1,400 units/hr at 0200 on 8/4 Check 6 hr heparin level Monitor daily heparin level, CBC, s/s of bleed  Elenor Quinones, PharmD, BCPS, BCIDP Clinical Pharmacist 04/14/2019 11:06 PM

## 2019-04-14 NOTE — Procedures (Signed)
Intubation Procedure Note Robert [name] [id number] 10-29-1950  Procedure: Intubation Indications: Respiratory insufficiency  Procedure Details Consent: Risks of procedure as well as the alternatives and risks of each were explained to the (patient/caregiver).  Consent for procedure obtained. Time Out: Verified patient identification, verified procedure, site/side was marked, verified correct patient position, special equipment/implants available, medications/allergies/relevent history reviewed, required imaging and test results available.  Performed  Drugs Versed 44m, Fentanyl 521m, Etomidate 2068mRocuronium 100m10m DL x 1 with MAC 4 blade Grade 1 view 8.0 ET tube passed through cords under direct visualization Placement confirmed with bilateral breath sounds, positive EtCO2 change and smoke in tube   Evaluation Hemodynamic Status: BP stable throughout; O2 sats: stable throughout Patient's Current Condition: stable Complications: No apparent complications Patient did tolerate procedure well. Chest X-ray ordered to verify placement.  CXR: pending.   DougSimonne Maffucci/2020

## 2019-04-14 NOTE — Progress Notes (Signed)
NAME:  Robert [name], MRN:  [id number], DOB:  09-Jun-1951, LOS: 2 ADMISSION DATE:  04/11/2019, CONSULTATION DATE:  8/1 REFERRING MD:  Dr. Roderic Palau, CHIEF COMPLAINT:  dyspnea   Brief History   68 y/o male admitted on 8/1 in the setting of ARDS from COVID-19 pneumonia.  Required intubation and mechanical ventilation at an outside hospital, has high sedative needs   Past Medical History  Anxiety COPD GERD CAD Hypertension Hyperlipidemia Rheumatoid arthritis History of systolic heart failure, however 08/2018 Echo showed LVEF 50-55%, left atrium dilated  Significant Hospital Events   8/1 admission to [facility] ICU 8/2 extubated 8/3 early AM agitated, placed on BIPAP  Consults:  PCCM  Procedures:  8/1 ETT >  8/1 L subclavian CVL >   Significant Diagnostic Tests:  08/2018 Echo> LVEF 50-55%, left atrium dilated  Micro Data:  7/31 blood >  7/31 sars-cov-2 > positive  Antimicrobials:  7/31 Cefepime >  7/31 Flagyl > 7/31 7/31 Vanc > 7/31 8/1 remdesivir > 8/1 actemra >  8/1 decadron >    Interim history/subjective:   Extubated yesterday, agitated, placed on BIPAP overngiht Increased work of breathing this morning   Objective   Blood pressure (!) 132/92, pulse 80, temperature 98.6 F (37 C), temperature source Oral, resp. rate (!) 26, height 6' (1.829 m), weight 109 kg, SpO2 99 %. CVP:  [6 mmHg-16 mmHg] 11 mmHg  Vent Mode: BIPAP FiO2 (%):  [40 %] 40 % Set Rate:  [15 bmp-26 bmp] 15 bmp Vt Set:  [460 mL] 460 mL PEEP:  [5 cmH20-8 cmH20] 5 cmH20 Pressure Support:  [5 cmH20-10 cmH20] 5 cmH20 Plateau Pressure:  [18 cmH20] 18 cmH20   Intake/Output Summary (Last 24 hours) at 04/14/2019 3810 Last data filed at 04/14/2019 0600 Gross per 24 hour  Intake 4278.01 ml  Output 1375 ml  Net 2903.01 ml   Filed Weights   04/11/19 2115 04/13/19 0500  Weight: 102.1 kg 109 kg    Examination:  General:  Increased work of breathing overnight HENT: NCAT OP clear PULM: Rhonchi, wheezing  bilaterally CV: Tachycardic, no mgr GI: BS+, soft, nontender MSK: normal bulk and tone Neuro: awake, alert, oriented, answering questions, MAEW  8/1 CXR images reviewed: consolidation/infiltrate left lung more than R, ETT in place  Resolved Hospital Problem list     Assessment & Plan:  Severe acute respiratory failure with hypoxemia secondary to COVID-19 pneumonia: worsening 8/3, multi-factorial, required re-intubation Continue mechanical ventilation per ARDS protocol Target TVol 6-8cc/kgIBW Target Plateau Pressure < 30cm H20 Target driving pressure less than 15 cm of water Target PaO2 55-65: titrate PEEP/FiO2 per protocol As long as PaO2 to FiO2 ratio is less than 1:150 position in prone position for 16 hours a day Check CVP daily if CVL in place Target CVP less than 4, diurese as necessary Ventilator associated pneumonia prevention protocol 8/3 plan: re-intubate, place back on ARDS vent settings for now, diuresis, continue decadron and remdesivir  Acute pulmonary edema: Lasix x1 dose  COPD with acute exacerbation Continue decadron Add duoneb q4h  Need for sedation/mechanical ventilation RASS target -2 PAD protocol: precedex and fentanyl infusion   Best practice:  Diet: tube feeding Pain/Anxiety/Delirium protocol (if indicated): as above VAP protocol (if indicated): yes DVT prophylaxis: lovenox GI prophylaxis: Pantoprazole for stress ulcer prophylaxis Glucose control: SSI Mobility: bed rest Code Status: full Family Communication: will discuss with TRH Disposition: remain in ICU  Labs   CBC: Recent Labs  Lab 04/11/19 2125 04/12/19 0639 04/12/19 0708 04/12/19  1151 04/13/19 0435 04/14/19 0343  WBC 4.0  --  6.1  --  3.8*  --   NEUTROABS 3.5  --   --   --  2.9  --   HGB 14.5 12.6* 12.8* 13.6 12.9* 15.0  HCT 42.9 37.0* 38.0* 40.0 39.2 44.0  MCV 87.7  --  90.5  --  90.7  --   PLT 102*  --  102*  --  104*  --     Basic Metabolic Panel: Recent Labs  Lab  04/11/19 2125 04/12/19 0639 04/12/19 0708 04/12/19 1151 04/12/19 1610 04/13/19 0435 04/13/19 1625 04/14/19 0343 04/14/19 0520  NA 132* 133*  --  133*  --  135  --  142 143  K 4.2 4.0  --  4.3  --  4.0  --  4.2 4.0  CL 100  --   --   --   --  104  --   --  112*  CO2 22  --   --   --   --  22  --   --  23  GLUCOSE 220*  --   --   --   --  196*  --   --  128*  BUN 20  --   --   --   --  44*  --   --  48*  CREATININE 0.80  --  1.44*  --   --  1.74*  --   --  1.54*  CALCIUM 8.4*  --   --   --   --  7.2*  --   --  7.4*  MG  --   --  1.4*  --  1.6* 1.9 2.0  --  2.2  PHOS  --   --  4.1  --  4.2 4.0 3.5  --   --    GFR: Estimated Creatinine Clearance: 61 mL/min (A) (by C-G formula based on SCr of 1.54 mg/dL (H)). Recent Labs  Lab 04/11/19 2125 04/12/19 0708 04/13/19 0435  PROCALCITON  --  0.32  --   WBC 4.0 6.1 3.8*  LATICACIDVEN 1.2  --   --     Liver Function Tests: Recent Labs  Lab 04/11/19 2125 04/13/19 0435 04/14/19 0520  AST 33 63* 104*  ALT 33 48* 87*  ALKPHOS 66 60 65  BILITOT 0.4 0.5 0.4  PROT 6.7 5.7* 6.0*  ALBUMIN 3.0* 2.3* 2.4*   No results for input(s): LIPASE, AMYLASE in the last 168 hours. No results for input(s): AMMONIA in the last 168 hours.  ABG    Component Value Date/Time   PHART 7.200 (L) 04/14/2019 0343   PCO2ART 61.1 (H) 04/14/2019 0343   PO2ART 70.0 (L) 04/14/2019 0343   HCO3 23.9 04/14/2019 0343   TCO2 26 04/14/2019 0343   ACIDBASEDEF 5.0 (H) 04/14/2019 0343   O2SAT 89.0 04/14/2019 0343     Coagulation Profile: Recent Labs  Lab 04/11/19 2125  INR 1.1    Cardiac Enzymes: No results for input(s): CKTOTAL, CKMB, CKMBINDEX, TROPONINI in the last 168 hours.  HbA1C: Hgb A1c MFr Bld  Date/Time Value Ref Range Status  04/11/2019 09:50 PM 7.4 (H) 4.8 - 5.6 % Final    Comment:    (NOTE) Pre diabetes:          5.7%-6.4% Diabetes:              >6.4% Glycemic control for   <7.0% adults with diabetes   06/07/2017 12:02 AM 9.1 (H)  4.8 - 5.6 % Final    Comment:    (NOTE) Pre diabetes:          5.7%-6.4% Diabetes:              >6.4% Glycemic control for   <7.0% adults with diabetes     CBG: Recent Labs  Lab 04/13/19 0730 04/13/19 1548 04/13/19 1941 04/13/19 2318 04/14/19 0337  GLUCAP 181* 331* 273* 190* 110*     Critical care time: 39 minutes    Roselie Awkward, MD [place] [facility] Pager: [phone] Cell: [phone] If no response, call [phone]

## 2019-04-14 NOTE — Progress Notes (Addendum)
Notified of increased WOB, increased RR, increased HR.   Patient has PMHx of COPD, HFpEF, CAD, HTN, and RA, admitted early am of 8/1 with acute respiratory failure secondary to COVID-19 PNA. He was successfully extubated on 8/2, had been doing well from respiratory standpoint last night, but then became restless early this am and had progressive increase in WOB and increase O2-requirements.   Vitals:  RR low-mid 30's, HR 130's, SBP 170's. Sat low 90's on 15 Lpm.   Exam: Somnolent, accessory muscle recruitment, tachypnea, prolonged expiratory phase, rhonci b/l. No pallor or cyanosis.   ABG 7.20/61/70/26.    Placed on BiPAP. WOB, RR, and HR all improved on BiPAP, pt tolerating well. Can give albuterol, low-dose Ativan prn. Continue close monitoring.

## 2019-04-14 NOTE — Progress Notes (Signed)
Spoke with daughter [name], update given and questions answered, appreciative of care.

## 2019-04-14 NOTE — Progress Notes (Signed)
Pt with increased work of breathing and AMS.  titrated from 4L to 15L over last hour to maintain spo2 > 90. MD to bedside ABG ordered. Patient placed on BIPAP.

## 2019-04-14 NOTE — Progress Notes (Signed)
Night shift ICU coverage note.  The patient has shown sinus arrhythmia with ectopic rhythms including tracings consistent with atrial flutter. EKG shows sinus arrhythmia with ectopy and prolonged QT segment. This seems to be exacerbated by his febrile episodes. A dose of diltiazem 15 mg IVP was given earlier along with magnesium sulfate 2 grams IVPB. He was started on heparin infusion and will need close monitoring of platelets, which most recently were 120k.   Tennis Must, MD

## 2019-04-14 NOTE — Progress Notes (Signed)
OT Cancellation Note  Patient Details Name: Robert [name] MRN: [id number] DOB: [birth year]   Cancelled Treatment:    Reason Eval/Treat Not Completed: Patient not medically ready(Pt reintubated. Will return as schedule allows and when medically stable. Thank you.)  [place], OTR/L Acute Rehab Pager: [phone] Office: [phone] 04/14/2019, 1:26 PM

## 2019-04-14 NOTE — Progress Notes (Signed)
PROGRESS NOTE    Robert [name]  TZG:[id number] DOB: April 01, 1951 DOA: 04/11/2019 PCP: Redmond School, MD    Brief Narrative:  68 year old male with a history of COPD, coronary artery disease, diabetes, ischemic cardiomyopathy, rheumatoid arthritis, presents to the hospital with complaints of progressive worsening shortness of breath.  He was mildly febrile and had worsening shortness of breath so he called EMS.  In the ER, he was noted to be substantially hypoxic, had mental status changes.  He tested positive for COVID-19.  He was intubated in the emergency room for persistent hypoxia.  He has been transferred to [facility] for further management.   Assessment & Plan:   Active Problems:   Rheumatoid arthritis ([place])   COPD (chronic obstructive pulmonary disease) (HCC)   Controlled type 2 diabetes mellitus with diabetic peripheral angiopathy without gangrene, with long-term current use of insulin (HCC)   Essential hypertension   Ischemic cardiomyopathy   Coronary artery disease   Dyslipidemia (high LDL; low HDL)   COVID-19 virus detected   Acute respiratory distress syndrome (ARDS) due to COVID-19 virus   1. Acute respiratory failure with hypoxia secondary to COVID-19 pneumonia.  Patient was initially intubated in the emergency room, and was extubated on 8/2.  He initially performed well, but subsequently became short of breath and started wheezing.  He was reintubated on 8/3.  He is currently on a course of Remdesivir.  He received a dose of Actemra on 8/1.  He has also been started on dexamethasone.  Chest x-ray shows bilateral infiltrates.  Procalcitonin is mildly elevated, will repeat.  He is currently on cefepime.  Sputum culture in process.  Will give a dose of Lasix today. 2. Hypotension.  Briefly required vasopressors, but have since been weaned off.  Possibly related to propofol which has been weaned off.  He is on multiple antihypertensives for cardiomyopathy which are  currently held.  Continue to follow. 3. Acute kidney injury.  Suspect this is secondary to hypotension as well as recent ARB/Lasix use.  Started on gentle hydration with mild improvement of renal function.  Continue to monitor. 4. Rheumatoid arthritis.  Home medications indicate that he is chronically on methotrexate and Actemra. 5. Ischemic cardiomyopathy.  Last ejection fraction from 2019 was reportedly normal.  He is on nitrates, ARB, beta blockers, and daily Lasix all of which are on hold due to hypotension. 6. Diabetes.  Continue decreased dose of basal insulin.  Follow blood sugars and sliding scale.  Hold oral agents. Currently blood sugars are stable 7. Coronary artery disease with history of stents.  Continue on aspirin and Plavix for now. 8. Thrombocytopenia.  Appears to be present since 2018.  Near baseline.  Continue to follow. 9. COPD.  Has some wheezing this morning.  Started on Pulmicort and Brovana.  He is on steroids.  Continue to monitor.   DVT prophylaxis: Lovenox 40 mg every 12 hours Code Status: Full code Family Communication: discussed with daughter, Maudie Mercury 8/3 Disposition Plan: Continue monitoring in the ICU   Consultants:   PCCM  Procedures:   Intubation 7/31 >8/2, 8/3>  Left subclavian central line 8/1 >  Antimicrobials:   Cefepime 7/31 >   Subjective: Patient was extubated yesterday afternoon.  He initially did well, but became more short of breath overnight.  Blood gases showed PCO2 was starting to climb.  This morning he was noted to be short of breath and wheezing.  He was reintubated.  Objective: Vitals:   04/14/19 1500 04/14/19  1600 04/14/19 1700 04/14/19 1800  BP: 124/77 125/72 109/65 113/65  Pulse: 72 82 80 78  Resp: (!) 23 12 (!) 26 (!) 26  Temp:  98.1 F (36.7 C)    TempSrc:  Axillary    SpO2: 99% 98% 99% 100%  Weight:      Height:        Intake/Output Summary (Last 24 hours) at 04/14/2019 1844 Last data filed at 04/14/2019 1800 Gross per  24 hour  Intake 2752.43 ml  Output 3100 ml  Net -347.57 ml   Filed Weights   04/11/19 2115 04/13/19 0500  Weight: 102.1 kg 109 kg    Examination:  General exam: Intubated and sedated Respiratory system: Mild wheeze bilaterally. Respiratory effort normal. Cardiovascular system:RRR. No murmurs, rubs, gallops. Gastrointestinal system: Abdomen is nondistended, soft and nontender. No organomegaly or masses felt. Normal bowel sounds heard. Central nervous system: Cannot assess due to sedation Extremities: No C/C/E, +pedal pulses Skin: No rashes, lesions or ulcers Psychiatry: Sedated   Data Reviewed: I have personally reviewed following labs and imaging studies  CBC: Recent Labs  Lab 04/11/19 2125  04/12/19 0708 04/12/19 1151 04/13/19 0435 04/14/19 0343 04/14/19 0520  WBC 4.0  --  6.1  --  3.8*  --  4.9  NEUTROABS 3.5  --   --   --  2.9  --  3.6  HGB 14.5   < > 12.8* 13.6 12.9* 15.0 13.8  HCT 42.9   < > 38.0* 40.0 39.2 44.0 41.6  MCV 87.7  --  90.5  --  90.7  --  90.8  PLT 102*  --  102*  --  104*  --  120*   < > = values in this interval not displayed.   Basic Metabolic Panel: Recent Labs  Lab 04/11/19 2125 04/12/19 0639 04/12/19 0708 04/12/19 1151 04/12/19 1610 04/13/19 0435 04/13/19 1625 04/14/19 0343 04/14/19 0520  NA 132* 133*  --  133*  --  135  --  142 143  K 4.2 4.0  --  4.3  --  4.0  --  4.2 4.0  CL 100  --   --   --   --  104  --   --  112*  CO2 22  --   --   --   --  22  --   --  23  GLUCOSE 220*  --   --   --   --  196*  --   --  128*  BUN 20  --   --   --   --  44*  --   --  48*  CREATININE 0.80  --  1.44*  --   --  1.74*  --   --  1.54*  CALCIUM 8.4*  --   --   --   --  7.2*  --   --  7.4*  MG  --   --  1.4*  --  1.6* 1.9 2.0  --  2.2  PHOS  --   --  4.1  --  4.2 4.0 3.5  --  3.2   GFR: Estimated Creatinine Clearance: 61 mL/min (A) (by C-G formula based on SCr of 1.54 mg/dL (H)). Liver Function Tests: Recent Labs  Lab 04/11/19 2125 04/13/19  0435 04/14/19 0520  AST 33 63* 104*  ALT 33 48* 87*  ALKPHOS 66 60 65  BILITOT 0.4 0.5 0.4  PROT 6.7 5.7* 6.0*  ALBUMIN 3.0* 2.3* 2.4*  No results for input(s): LIPASE, AMYLASE in the last 168 hours. No results for input(s): AMMONIA in the last 168 hours. Coagulation Profile: Recent Labs  Lab 04/11/19 2125  INR 1.1   Cardiac Enzymes: No results for input(s): CKTOTAL, CKMB, CKMBINDEX, TROPONINI in the last 168 hours. BNP (last 3 results) No results for input(s): PROBNP in the last 8760 hours. HbA1C: Recent Labs    04/11/19 2150  HGBA1C 7.4*   CBG: Recent Labs  Lab 04/13/19 2318 04/14/19 0337 04/14/19 0731 04/14/19 1136 04/14/19 1519  GLUCAP 190* 110* 106* 172* 228*   Lipid Profile: Recent Labs    04/12/19 0249  TRIG 184*   Thyroid Function Tests: No results for input(s): TSH, T4TOTAL, FREET4, T3FREE, THYROIDAB in the last 72 hours. Anemia Panel: Recent Labs    04/12/19 0708  FERRITIN 495*   Sepsis Labs: Recent Labs  Lab 04/11/19 2125 04/12/19 0708  PROCALCITON  --  0.32  LATICACIDVEN 1.2  --     Recent Results (from the past 240 hour(s))  Novel Coronavirus, NAA ([facility])     Status: Abnormal   Collection Time: 04/11/19  8:21 AM   Specimen: Nasal Swab  Result Value Ref Range Status   SARS-CoV-2, NAA Detected (A) Not Detected Final    Comment: This test was developed and its performance characteristics determined by [employer]. This test has not been FDA cleared or approved. This test has been authorized by FDA under an Emergency Use Authorization (EUA). This test is only authorized for the duration of time the declaration that circumstances exist justifying the authorization of the emergency use of in vitro diagnostic tests for detection of SARS-CoV-2 virus and/or diagnosis of COVID-19 infection under section 564(b)(1) of the Act, 21 U.S.C. 353GDJ-2(E)(2), unless the authorization is terminated or revoked sooner. When diagnostic  testing is negative, the possibility of a false negative result should be considered in the context of a patient's recent exposures and the presence of clinical signs and symptoms consistent with COVID-19. An individual without symptoms of COVID-19 and who is not shedding SARS-CoV-2 virus would expect to have a negative (not detected) result in this assay.   Blood Culture (routine x 2)     Status: None (Preliminary result)   Collection Time: 04/11/19  9:25 PM   Specimen: BLOOD  Result Value Ref Range Status   Specimen Description BLOOD  Final   Special Requests NONE  Final   Culture   Final    NO GROWTH 3 DAYS Performed at [facility], [address], [place], [place] [address]    Report Status PENDING  Incomplete  Urine culture     Status: None   Collection Time: 04/11/19  9:25 PM   Specimen: In/Out Cath Urine  Result Value Ref Range Status   Specimen Description   Final    IN/OUT CATH URINE Performed at [facility], [address], [place], [place] [address]    Special Requests   Final    NONE Performed at [facility], [address], [place], [place] [address]    Culture   Final    NO GROWTH Performed at [facility], [place] [address] [place], [place] [address]    Report Status 04/13/2019 FINAL  Final  Blood Culture (routine x 2)     Status: None (Preliminary result)   Collection Time: 04/11/19  9:45 PM   Specimen: BLOOD  Result Value Ref Range Status   Specimen Description BLOOD  Final   Special Requests  NONE  Final   Culture   Final    NO GROWTH 3 DAYS Performed at [facility], [address], [place], [place] [address]    Report Status PENDING  Incomplete  SARS Coronavirus 2 [facility] order, Performed in [facility] hospital lab) Nasopharyngeal Nasopharyngeal Swab     Status: Abnormal   Collection Time: 04/11/19  9:50 PM   Specimen: Nasopharyngeal Swab  Result Value Ref Range Status   SARS Coronavirus 2 POSITIVE (A) NEGATIVE Final    Comment: RESULT  CALLED TO, READ BACK BY AND VERIFIED WITH: TURNER,C. AT 2356 ON 04/11/2019 (NOTE) If result is NEGATIVE SARS-CoV-2 target nucleic acids are NOT DETECTED. The SARS-CoV-2 RNA is generally detectable in upper and lower  respiratory specimens during the acute phase of infection. The lowest  concentration of SARS-CoV-2 viral copies this assay can detect is 250  copies / mL. A negative result does not preclude SARS-CoV-2 infection  and should not be used as the sole basis for treatment or other  patient management decisions.  A negative result may occur with  improper specimen collection / handling, submission of specimen other  than nasopharyngeal swab, presence of viral mutation(s) within the  areas targeted by this assay, and inadequate number of viral copies  (<250 copies / mL). A negative result must be combined with clinical  observations, patient history, and epidemiological information. If result is POSITIVE SARS-CoV-2 target nucleic acids are DETECTED. The S ARS-CoV-2 RNA is generally detectable in upper and lower  respiratory specimens during the acute phase of infection.  Positive  results are indicative of active infection with SARS-CoV-2.  Clinical  correlation with patient history and other diagnostic information is  necessary to determine patient infection status.  Positive results do  not rule out bacterial infection or co-infection with other viruses. If result is PRESUMPTIVE POSTIVE SARS-CoV-2 nucleic acids MAY BE PRESENT.   A presumptive positive result was obtained on the submitted specimen  and confirmed on repeat testing.  While 2019 novel coronavirus  (SARS-CoV-2) nucleic acids may be present in the submitted sample  additional confirmatory testing may be necessary for epidemiological  and / or clinical management purposes  to differentiate between  SARS-CoV-2 and other Sarbecovirus currently known to infect humans.  If clinically indicated additional testing with an  alternate test  methodology [phone]) is adv ised. The SARS-CoV-2 RNA is generally  detectable in upper and lower respiratory specimens during the acute  phase of infection. The expected result is Negative. Fact Sheet for Patients:  StrictlyIdeas.no Fact Sheet for Healthcare Providers: BankingDealers.co.za This test is not yet approved or cleared by the [place] FDA and has been authorized for detection and/or diagnosis of SARS-CoV-2 by FDA under an Emergency Use Authorization (EUA).  This EUA will remain in effect (meaning this test can be used) for the duration of the COVID-19 declaration under Section 564(b)(1) of the Act, 21 U.S.C. section 360bbb-3(b)(1), unless the authorization is terminated or revoked sooner. Performed at [facility], [address] [place], [place] [address]   MRSA PCR Screening     Status: None   Collection Time: 04/12/19  6:56 AM   Specimen: Nasal Mucosa; Nasopharyngeal  Result Value Ref Range Status   MRSA by PCR NEGATIVE NEGATIVE Final    Comment:        The GeneXpert MRSA Assay (FDA approved for NASAL specimens only), is one component of a comprehensive MRSA colonization surveillance program. It is not intended to diagnose MRSA infection nor  to guide or monitor treatment for MRSA infections. Performed at [facility], [place] [address], [place], [place] [address]   Expectorated sputum assessment w rflx to resp cult     Status: None   Collection Time: 04/12/19  3:56 PM   Specimen: Tracheal Aspirate; Sputum  Result Value Ref Range Status   Specimen Description TRACHEAL ASPIRATE  Final   Special Requests NONE  Final   Sputum evaluation   Final    THIS SPECIMEN IS ACCEPTABLE FOR SPUTUM CULTURE Performed at [facility], [place] [address], [place], [place] [address]    Report Status 04/12/2019 FINAL  Final  Culture, respiratory     Status: None (Preliminary  result)   Collection Time: 04/12/19  3:56 PM   Specimen: Tracheal Aspirate  Result Value Ref Range Status   Specimen Description   Final    TRACHEAL ASPIRATE Performed at [place] [address], [place], [place] [address]    Special Requests   Final    NONE Reflexed from [phone] Performed at [facility], [place] [address], [place], [place] [address]    Gram Stain   Final    MODERATE WBC PRESENT, PREDOMINANTLY MONONUCLEAR RARE SQUAMOUS EPITHELIAL CELLS PRESENT RARE GRAM POSITIVE COCCI IN CLUSTERS RARE BUDDING YEAST SEEN    Culture   Final    CULTURE REINCUBATED FOR BETTER GROWTH Performed at [facility], [place] [address], [place], [place] [address]    Report Status PENDING  Incomplete         Radiology Studies: Dg Abd 1 View  Result Date: 04/14/2019 CLINICAL DATA:  OG tube placement. EXAM: ABDOMEN - 1 VIEW COMPARISON:  Radiograph dated 04/13/2019 FINDINGS: The OG tube tip is in the upper body of the stomach, unchanged since the prior exam. Visualized bowel gas pattern is normal. Interstitial accentuation and infiltrates at the lung bases are stable. IMPRESSION: OG tube tip in the upper body of the stomach.  No change. Electronically Signed   By: Lorriane Shire M.D.   On: 04/14/2019 10:52   Dg Abd 1 View  Result Date: 04/13/2019 CLINICAL DATA:  NG tube placement. EXAM: ABDOMEN - 1 VIEW COMPARISON:  Chest radiograph, 04/12/2019. FINDINGS: Nasal/orogastric tube passes below the diaphragm. Tip projects in the mid stomach. Coarsely thickened interstitial markings noted at the lung bases. IMPRESSION: Well-positioned nasal/orogastric tube. Electronically Signed   By: Lajean Manes M.D.   On: 04/13/2019 18:07   Dg Chest Port 1 View  Result Date: 04/14/2019 CLINICAL DATA:  Pulmonary infiltrates. Intubation. EXAM: PORTABLE CHEST 1 VIEW COMPARISON:  04/12/2019 and 04/11/2019 FINDINGS: Endotracheal tube is 4.8 cm above the carina. OG tube tip is  in the stomach. Central catheter tip is in the SVC at the level of the carina. The bilateral pulmonary infiltrates demonstrate progression in the right mid and lower lung zones and in the left lower lung zone. Heart size and vascularity are within normal limits. Aortic atherosclerosis. Coronary artery stents. No significant bone abnormality. IMPRESSION: 1. Progressive bilateral pulmonary infiltrates. 2. Endotracheal tube is 4.8 cm above the carina. Electronically Signed   By: Lorriane Shire M.D.   On: 04/14/2019 10:55        Scheduled Meds: . arformoterol  15 mcg Nebulization BID  . aspirin  81 mg Oral Daily  . budesonide (PULMICORT) nebulizer solution  0.5 mg Nebulization BID  . chlorhexidine gluconate (MEDLINE KIT)  15 mL Mouth Rinse BID  . Chlorhexidine Gluconate Cloth  6 each Topical Daily  . clonazePAM  2 mg Per Tube BID  . clopidogrel  75 mg Per Tube Daily  . dexamethasone (DECADRON) injection  6 mg Intravenous Q24H  . enoxaparin (LOVENOX) injection  40 mg Subcutaneous Q12H  . [START ON 04/15/2019] feeding supplement (PRO-STAT SUGAR FREE 64)  30 mL Per Tube Daily  . folic acid  1 mg Per Tube Daily  . HYDROmorphone  2 mg Oral Q6H  . insulin aspart  0-20 Units Subcutaneous Q4H  . insulin glargine  15 Units Subcutaneous BID  . ipratropium-albuterol  3 mL Nebulization Q4H  . mouth rinse  15 mL Mouth Rinse 10 times per day  . multivitamin  15 mL Per Tube Daily  . pantoprazole sodium  40 mg Per Tube Daily   Continuous Infusions: . ceFEPime (MAXIPIME) IV 200 mL/hr at 04/14/19 1400  . dexmedetomidine (PRECEDEX) IV infusion 0.8 mcg/kg/hr (04/14/19 1745)  . feeding supplement (VITAL AF 1.2 CAL) 1,000 mL (04/14/19 1423)  . fentaNYL infusion INTRAVENOUS 150 mcg/hr (04/14/19 1400)  . phenylephrine (NEO-SYNEPHRINE) Adult infusion Stopped (04/13/19 0503)  . propofol (DIPRIVAN) infusion Stopped (04/12/19 2000)  . remdesivir 100 mg in NS 250 mL Stopped (04/14/19 1243)     LOS: 2 days     Critical care time: 40 minutes    Kathie Dike, MD [facility]   If 7PM-7AM, please contact night-coverage [email or web address]  04/14/2019, 6:44 PM

## 2019-04-14 NOTE — Progress Notes (Signed)
ANTICOAGULATION CONSULT NOTE - Initial Consult  Pharmacy Consult for heparin Indication: atrial fibrillation  Allergies  Allergen Reactions  . Ivp Dye [Iodinated Diagnostic Agents] Itching and Rash    Patient Measurements: Height: 6' (182.9 cm) Weight: 240 lb 4.8 oz (109 kg) IBW/kg (Calculated) : 77.6 Heparin Dosing Weight: 98.5 kg  Vital Signs: Temp: 98.1 F (36.7 C) (08/03 1600) Temp Source: Axillary (08/03 1600) BP: 130/71 (08/03 2000) Pulse Rate: 75 (08/03 2000)  Labs: Recent Labs    04/12/19 0249  04/12/19 0708  04/13/19 0435 04/14/19 0343 04/14/19 0520  HGB  --    < > 12.8*   < > 12.9* 15.0 13.8  HCT  --    < > 38.0*   < > 39.2 44.0 41.6  PLT  --   --  102*  --  104*  --  120*  CREATININE  --   --  1.44*  --  1.74*  --  1.54*  TROPONINIHS 34*  --   --   --   --   --   --    < > = values in this interval not displayed.    Estimated Creatinine Clearance: 61 mL/min (A) (by C-G formula based on SCr of 1.54 mg/dL (H)).   Medical History: Past Medical History:  Diagnosis Date  . Anxiety   . COPD (chronic obstructive pulmonary disease) ([place])   . Coronary artery disease   . Full dentures   . GERD (gastroesophageal reflux disease)    Rolaids as needed  . High cholesterol   . History of MI (myocardial infarction)   . Hypertension    med. dosage increased 04/2016; has been on BP med. x 10 yrs.  . Incarcerated umbilical hernia 82/9937  . Inguinal hernia 05/2016   bilateral   . Insulin dependent diabetes mellitus ([place])   . Ischemic cardiomyopathy    a. 03/2015 EF 40-45% by LV gram.  . Left leg cellulitis 10/08/2016  . Rheumatoid arthritis(714.0)    Assessment: 68 yo M with new onset Afib. Has been on Lovenox for DVT px. Now to switch to heparin. Last Lovenox 40mg  dose was at 2100. Hgb stable, plts low but stable at 120.  Goal of Therapy:  Heparin level 0.3-0.7 units/ml Monitor platelets by anticoagulation protocol: Yes   Plan:  Stop enoxaparin 40mg  [place]  Q12h No heparin bolus Start heparin gtt at 1,400 units/hr at 0200 on 8/4 Check 6 hr heparin level Monitor daily heparin level, CBC, s/s of bleed  Elenor Quinones, PharmD, BCPS, BCIDP Clinical Pharmacist 04/14/2019 11:11 PM

## 2019-04-14 NOTE — Progress Notes (Signed)
[place] Progress Note Patient Name: Robert [name] DOB: 11-19-1950 MRN: [id number]   Date of Service  04/14/2019  HPI/Events of Note  Notified of patient being agitated trying to pull off BiPap.  eICU Interventions  Low dose Precedex ordered for compliance with BiPap and avoid reintubation     Intervention Category Major Interventions: Delirium, psychosis, severe agitation - evaluation and management  Shona Needles Adelina Collard 04/14/2019, 4:48 AM

## 2019-04-14 NOTE — Progress Notes (Signed)
Nutrition Follow-up / Consult RD working remotely.  DOCUMENTATION CODES:   Obesity unspecified  INTERVENTION:    Vital AF 1.2 at 75 ml/h (1800 ml per day)   Pro-stat 30 ml once daily   Provides 2260 kcal, 150 gm protein, 1460 ml free water daily  NUTRITION DIAGNOSIS:   Inadequate oral intake related to inability to eat as evidenced by NPO status.  Ongoing   GOAL:   Provide needs based on ASPEN/SCCM guidelines  Met with TF  MONITOR:   Vent status, TF tolerance, Labs, Weight trends  REASON FOR ASSESSMENT:   Ventilator, Consult Enteral/tube feeding initiation and management  ASSESSMENT:   69 yo male admitted with ARDS from COVID-19 pneumonia. PMH includes HLD, anxiety, ischemic cardiomyopathy, CAD, COPD, RA, HTN, DM-on insulin.  Patient was extubated 8/2, TF stopped, placed on BiPAP overnight, then required re-intubation this morning. Received MD Consult for TF re-initiation and management. OG tube in place.   On ARDS vent settings. Diuresis ongoing for acute pulmonary edema.   Patient is currently intubated on ventilator support MV: 12.3 L/min Temp (24hrs), Avg:98.9 F (37.2 C), Min:98 F (36.7 C), Max:99.5 F (37.5 C)   Labs reviewed.  CBG's: 110-106-172  Medications reviewed and include decadron, folic acid, novolog, lasix, lantus, MVI.  Per RN documentation, patient has generalized non-pitting edema today.  NUTRITION - FOCUSED PHYSICAL EXAM:  unable to complete, working remotely  Diet Order:   Diet Order            Diet NPO time specified  Diet effective now              EDUCATION NEEDS:   No education needs have been identified at this time  Skin:  Skin Assessment: Reviewed RN Assessment  Last BM:  8/1 (type 6)  Height:   Ht Readings from Last 1 Encounters:  04/12/19 6' (1.829 m)    Weight:   Wt Readings from Last 1 Encounters:  04/13/19 109 kg   04/11/19 102.1 kg (Admission weight, BMI 30.5)  Ideal Body Weight:  80.9  kg  BMI:  Body mass index is 32.59 kg/m.  Estimated Nutritional Needs:   Kcal:  2200  Protein:  150-175 gm  Fluid:  2 L    Molli Barrows, RD, LDN, [place] Pager [phone] After Hours Pager [phone]

## 2019-04-15 DIAGNOSIS — I4891 Unspecified atrial fibrillation: Secondary | ICD-10-CM | POA: Diagnosis present

## 2019-04-15 LAB — POCT I-STAT 7, (LYTES, BLD GAS, ICA,H+H)
Acid-base deficit: 6 mmol/L — ABNORMAL HIGH (ref 0.0–2.0)
Bicarbonate: 20 mmol/L (ref 20.0–28.0)
Calcium, Ion: 1.11 mmol/L — ABNORMAL LOW (ref 1.15–1.40)
HCT: 37 % — ABNORMAL LOW (ref 39.0–52.0)
Hemoglobin: 12.6 g/dL — ABNORMAL LOW (ref 13.0–17.0)
O2 Saturation: 95 %
Patient temperature: 99.1
Potassium: 4.5 mmol/L (ref 3.5–5.1)
Sodium: 143 mmol/L (ref 135–145)
TCO2: 21 mmol/L — ABNORMAL LOW (ref 22–32)
pCO2 arterial: 40.8 mmHg (ref 32.0–48.0)
pH, Arterial: 7.299 — ABNORMAL LOW (ref 7.350–7.450)
pO2, Arterial: 86 mmHg (ref 83.0–108.0)

## 2019-04-15 LAB — COMPREHENSIVE METABOLIC PANEL
ALT: 82 U/L — ABNORMAL HIGH (ref 0–44)
AST: 74 U/L — ABNORMAL HIGH (ref 15–41)
Albumin: 2.3 g/dL — ABNORMAL LOW (ref 3.5–5.0)
Alkaline Phosphatase: 60 U/L (ref 38–126)
Anion gap: 8 (ref 5–15)
BUN: 63 mg/dL — ABNORMAL HIGH (ref 8–23)
CO2: 20 mmol/L — ABNORMAL LOW (ref 22–32)
Calcium: 7.3 mg/dL — ABNORMAL LOW (ref 8.9–10.3)
Chloride: 114 mmol/L — ABNORMAL HIGH (ref 98–111)
Creatinine, Ser: 1.77 mg/dL — ABNORMAL HIGH (ref 0.61–1.24)
GFR calc Af Amer: 46 mL/min — ABNORMAL LOW (ref >60)
GFR calc non Af Amer: 39 mL/min — ABNORMAL LOW (ref >60)
Glucose, Bld: 360 mg/dL — ABNORMAL HIGH (ref 70–99)
Potassium: 4.6 mmol/L (ref 3.5–5.1)
Sodium: 142 mmol/L (ref 135–145)
Total Bilirubin: 0.3 mg/dL (ref 0.3–1.2)
Total Protein: 5.8 g/dL — ABNORMAL LOW (ref 6.5–8.1)

## 2019-04-15 LAB — CBC WITH DIFFERENTIAL/PLATELET
Abs Immature Granulocytes: 0.01 10*3/uL (ref 0.00–0.07)
Basophils Absolute: 0 10*3/uL (ref 0.0–0.1)
Basophils Relative: 0 %
Eosinophils Absolute: 0 10*3/uL (ref 0.0–0.5)
Eosinophils Relative: 0 %
HCT: 40 % (ref 39.0–52.0)
Hemoglobin: 12.8 g/dL — ABNORMAL LOW (ref 13.0–17.0)
Immature Granulocytes: 0 %
Lymphocytes Relative: 28 %
Lymphs Abs: 1 10*3/uL (ref 0.7–4.0)
MCH: 29.4 pg (ref 26.0–34.0)
MCHC: 32 g/dL (ref 30.0–36.0)
MCV: 91.7 fL (ref 80.0–100.0)
Monocytes Absolute: 0.4 10*3/uL (ref 0.1–1.0)
Monocytes Relative: 10 %
Neutro Abs: 2.3 10*3/uL (ref 1.7–7.7)
Neutrophils Relative %: 62 %
Platelets: 112 10*3/uL — ABNORMAL LOW (ref 150–400)
RBC: 4.36 MIL/uL (ref 4.22–5.81)
RDW: 15.7 % — ABNORMAL HIGH (ref 11.5–15.5)
WBC: 3.7 10*3/uL — ABNORMAL LOW (ref 4.0–10.5)
nRBC: 0 % (ref 0.0–0.2)

## 2019-04-15 LAB — HEPARIN LEVEL (UNFRACTIONATED)
Heparin Unfractionated: 0.83 IU/mL — ABNORMAL HIGH (ref 0.30–0.70)
Heparin Unfractionated: 0.65 IU/mL (ref 0.30–0.70)

## 2019-04-15 LAB — CULTURE, RESPIRATORY W GRAM STAIN: Culture: NORMAL

## 2019-04-15 LAB — GLUCOSE, CAPILLARY
Glucose-Capillary: 337 mg/dL — ABNORMAL HIGH (ref 70–99)
Glucose-Capillary: 272 mg/dL — ABNORMAL HIGH (ref 70–99)
Glucose-Capillary: 331 mg/dL — ABNORMAL HIGH (ref 70–99)
Glucose-Capillary: 396 mg/dL — ABNORMAL HIGH (ref 70–99)
Glucose-Capillary: 337 mg/dL — ABNORMAL HIGH (ref 70–99)
Glucose-Capillary: 281 mg/dL — ABNORMAL HIGH (ref 70–99)

## 2019-04-15 LAB — C-REACTIVE PROTEIN: CRP: 5.3 mg/dL — ABNORMAL HIGH (ref <1.0)

## 2019-04-15 LAB — TRIGLYCERIDES: Triglycerides: 212 mg/dL — ABNORMAL HIGH (ref <150)

## 2019-04-15 LAB — INTERLEUKIN-6, PLASMA: Interleukin-6, Plasma: 817.9 pg/mL — ABNORMAL HIGH (ref 0.0–12.2)

## 2019-04-15 MED ORDER — METOPROLOL TARTRATE 25 MG PO TABS: 25.0000 mg | ORAL_TABLET | Freq: Two times a day (BID) | ORAL | Status: DC

## 2019-04-15 MED ORDER — INSULIN GLARGINE 100 UNIT/ML ~~LOC~~ SOLN
25.0000 [IU] | Freq: Two times a day (BID) | SUBCUTANEOUS | Status: DC
  Administered 2019-04-15 – 2019-04-16 (×2): 25 [IU] via SUBCUTANEOUS
  Filled 2019-04-15 (×3): qty 0.25

## 2019-04-15 MED ORDER — LEVALBUTEROL HCL 1.25 MG/0.5ML IN NEBU
1.2500 mg | INHALATION_SOLUTION | Freq: Four times a day (QID) | RESPIRATORY_TRACT | Status: DC
  Administered 2019-04-15 – 2019-04-18 (×8): 1.25 mg via RESPIRATORY_TRACT
  Filled 2019-04-15 (×4): qty 0.5

## 2019-04-15 MED ORDER — LEVALBUTEROL HCL 1.25 MG/0.5ML IN NEBU
1.2500 mg | INHALATION_SOLUTION | Freq: Four times a day (QID) | RESPIRATORY_TRACT | Status: DC
  Administered 2019-04-15: 1.25 mg via RESPIRATORY_TRACT
  Filled 2019-04-15 (×5): qty 0.5

## 2019-04-15 MED ORDER — METOPROLOL TARTRATE 25 MG/10 ML ORAL SUSPENSION
25.0000 mg | Freq: Two times a day (BID) | ORAL | Status: DC
  Administered 2019-04-15 – 2019-04-27 (×24): 25 mg
  Filled 2019-04-15 (×24): qty 10

## 2019-04-15 NOTE — Care Management Important Message (Signed)
Important Message  Patient Details  Name: Robert [name] MRN: [id number] Date of Birth: June 15, 1951   Medicare Important Message Given:  Yes - Important Message mailed due to current National Emergency   Verbal consent obtained due to current National Emergency  Relationship to patient: Child Contact Name: Detron Carras Call Date: 04/15/19  Time: 1514 Phone: [phone] Outcome: Spoke with contact Important Message mailed to: Other (must enter [address] [place] [address])      Orbie Pyo 04/15/2019, 3:15 PM

## 2019-04-15 NOTE — Progress Notes (Signed)
Patient's daughter called and updated. 

## 2019-04-15 NOTE — Progress Notes (Signed)
ANTICOAGULATION & ANTIBIOTIC CONSULT NOTE - Follow Up Consult  Pharmacy Consult for heparin; Cefepime  Indication: atrial fibrillation; HCAP   Allergies  Allergen Reactions  . Ivp Dye [Iodinated Diagnostic Agents] Itching and Rash    Patient Measurements: Height: 6' (182.9 cm) Weight: 233 lb 14.5 oz (106.1 kg) IBW/kg (Calculated) : 77.6 Heparin Dosing Weight: 98.5 kg  Vital Signs: Temp: 100.1 F (37.8 C) (08/04 0800) Temp Source: Axillary (08/04 0800) BP: 118/65 (08/04 0900) Pulse Rate: 80 (08/04 0900)  Labs: Recent Labs    04/13/19 0435  04/14/19 0520 04/15/19 0401 04/15/19 0500 04/15/19 0850  HGB 12.9*   < > 13.8 12.6* 12.8*  --   HCT 39.2   < > 41.6 37.0* 40.0  --   PLT 104*  --  120*  --  112*  --   HEPARINUNFRC  --   --   --   --   --  0.83*  CREATININE 1.74*  --  1.54*  --  1.77*  --    < > = values in this interval not displayed.    Estimated Creatinine Clearance: 52.4 mL/min (A) (by C-G formula based on SCr of 1.77 mg/dL (H)).   Medical History: Past Medical History:  Diagnosis Date  . Anxiety   . COPD (chronic obstructive pulmonary disease) ([place])   . Coronary artery disease   . Full dentures   . GERD (gastroesophageal reflux disease)    Rolaids as needed  . High cholesterol   . History of MI (myocardial infarction)   . Hypertension    med. dosage increased 04/2016; has been on BP med. x 10 yrs.  . Incarcerated umbilical hernia 88/3254  . Inguinal hernia 05/2016   bilateral   . Insulin dependent diabetes mellitus ([place])   . Ischemic cardiomyopathy    a. 03/2015 EF 40-45% by LV gram.  . Left leg cellulitis 10/08/2016  . Rheumatoid arthritis(714.0)    Assessment: 68 yo M on IV heparin for new onset Afib.   AC: Initial HL is slightly supratherapeutic at 0.83 on 1400 units/hr. Hgb down slightly. Plt 112k   ID: Currently on D#4 of Cefepime for r/o pneumonia. PCT 0.2. WBC 3.2, afebrile. Remains culture negative   Goal of Therapy:  Heparin level  0.3-0.7 units/ml Monitor platelets by anticoagulation protocol: Yes   Plan:  Continue Cefepime 2 gm IV Q 8 hours. F/u LOT  Decrease IV heparin to 1300 units/hr  Check 6 hr heparin level Monitor daily heparin level, CBC, s/s of bleed  Albertina Parr, PharmD., BCPS Clinical Pharmacist Clinical phone for 04/15/19 until 5pm: [phone]

## 2019-04-15 NOTE — Progress Notes (Addendum)
PROGRESS NOTE    Robert [name]  OAC:[id number] DOB: March 15, 1951 DOA: 04/11/2019 PCP: Redmond School, MD    Brief Narrative:  68 year old male with a history of COPD, coronary artery disease, diabetes, ischemic cardiomyopathy, rheumatoid arthritis, presents to the hospital with complaints of progressive worsening shortness of breath.  He was mildly febrile and had worsening shortness of breath so he called EMS.  In the ER, he was noted to be substantially hypoxic, had mental status changes.  He tested positive for COVID-19.  He was intubated in the emergency room for persistent hypoxia.  He has been transferred to [facility] for further management.   Assessment & Plan:   Active Problems:   Rheumatoid arthritis ([place])   COPD (chronic obstructive pulmonary disease) (H[facility])   Controlled type 2 diabetes mellitus with diabetic peripheral angiopathy without gangrene, with long-term current use of insulin ([facility])   Essential hypertension   Ischemic cardiomyopathy   Coronary artery disease   Dyslipidemia (high LDL; low HDL)   COVID-19 virus detected   Acute respiratory distress syndrome (ARDS) due to COVID-19 virus   1. Acute respiratory failure with hypoxia secondary to COVID-19 pneumonia.  Patient was initially intubated in the emergency room, and was extubated on 8/2.  He initially performed well, but subsequently became short of breath and started wheezing.  He was reintubated on 8/3.  He is currently on a course of Remdesivir.  He received a dose of Actemra on 8/1.  He was also started on dexamethasone.  Chest x-ray shows bilateral infiltrates.  Respiratory culture showed normal flora.  Procalcitonin was only minimally elevated.  He was initially started on cefepime, but will discontinue antibacterials since there is no convincing evidence of bacterial pneumonia.  Persistent fevers are likely related to COVID-19. 2. A. fib with RVR.  Developed rapid atrial fibrillation overnight.  Likely  precipitated by underlying respiratory issues.  Heart rate is currently stable.  Will resume low-dose metoprolol.  Blood pressure currently stable.  Due to CHADSVASc score of 5, he was started on intravenous heparin. 3. Hypotension.  Briefly required vasopressors, but have since been weaned off.  Possibly related to propofol which has been weaned off.  He is on multiple antihypertensives for cardiomyopathy which are currently held.  Continue to follow. 4. Acute kidney injury.  Suspect this is secondary to hypotension as well as recent ARB/Lasix use.  Creatinine currently stable at 1.7.  Will hold off on further diuresis today..  Continue to monitor urine output and renal function. 5. Rheumatoid arthritis.  Home medications indicate that he is chronically on methotrexate and Actemra. 6. Ischemic cardiomyopathy.  Last ejection fraction from 2019 was reportedly normal.  Resume metoprolol.  He is also on nitrates, ARB,and daily Lasix all of which are on hold due to hypotension. 7. Diabetes.  Holding oral agents.  Blood sugars continue to run high.  Basal insulin dose will be adjusted.  Continue to follow on sliding scale. 8. Coronary artery disease with history of stents.  Continue on aspirin and Plavix for now. 9. Thrombocytopenia.  Appears to be present since 2018.  Near baseline.  Continue to follow, especially in the setting of anticoagulation. 10. COPD.  Overall wheezing is better.  Continue on Pulmicort and Brovana.  He is on steroids.  Continue to monitor.   DVT prophylaxis: Lovenox 40 mg every 12 hours Code Status: Full code Family Communication: discussed with daughter, Maudie Mercury 8/4 Disposition Plan: Continue monitoring in the ICU   Consultants:   PCCM  Procedures:   Intubation 7/31 >8/2, 8/3>  Left subclavian central line 8/1 >  Antimicrobials:   Cefepime 7/31 >8/4   Subjective: It was noted overnight the patient became tachycardic and was noted to be in atrial fibrillation.   Started on heparin infusion.  Heart rate is currently controlled.  Objective: Vitals:   04/15/19 1141 04/15/19 1200 04/15/19 1300 04/15/19 1400  BP: 136/60 (!) 137/59 138/62 138/88  Pulse: 91 92 93 94  Resp:  (!) 21 (!) 21 (!) 22  Temp:  (!) 101.4 F (38.6 C)    TempSrc:  Axillary    SpO2: 99% 99% 99% 98%  Weight:      Height:        Intake/Output Summary (Last 24 hours) at 04/15/2019 1445 Last data filed at 04/15/2019 1400 Gross per 24 hour  Intake 3377.25 ml  Output 2985 ml  Net 392.25 ml   Filed Weights   04/11/19 2115 04/13/19 0500 04/15/19 0230  Weight: 102.1 kg 109 kg 106.1 kg    Examination:  General exam: intubated and sedated Respiratory system: clear bilaterally. Respiratory effort normal. Cardiovascular system:irregular rate and rhythm. No murmurs, rubs, gallops. Gastrointestinal system: Abdomen is nondistended, soft and nontender. No organomegaly or masses felt. Normal bowel sounds heard. Central nervous system: unable to assess due to sedation Extremities: No C/C/E, +pedal pulses Skin: No rashes, lesions or ulcers Psychiatry: sedated.    Data Reviewed: I have personally reviewed following labs and imaging studies  CBC: Recent Labs  Lab 04/11/19 2125  04/12/19 0708  04/13/19 0435 04/14/19 0343 04/14/19 0520 04/15/19 0401 04/15/19 0500  WBC 4.0  --  6.1  --  3.8*  --  4.9  --  3.7*  NEUTROABS 3.5  --   --   --  2.9  --  3.6  --  2.3  HGB 14.5   < > 12.8*   < > 12.9* 15.0 13.8 12.6* 12.8*  HCT 42.9   < > 38.0*   < > 39.2 44.0 41.6 37.0* 40.0  MCV 87.7  --  90.5  --  90.7  --  90.8  --  91.7  PLT 102*  --  102*  --  104*  --  120*  --  112*   < > = values in this interval not displayed.   Basic Metabolic Panel: Recent Labs  Lab 04/11/19 2125  04/12/19 0708  04/12/19 1610 04/13/19 0435 04/13/19 1625 04/14/19 0343 04/14/19 0520 04/15/19 0401 04/15/19 0500  NA 132*   < >  --    < >  --  135  --  142 143 143 142  K 4.2   < >  --    < >  --   4.0  --  4.2 4.0 4.5 4.6  CL 100  --   --   --   --  104  --   --  112*  --  114*  CO2 22  --   --   --   --  22  --   --  23  --  20*  GLUCOSE 220*  --   --   --   --  196*  --   --  128*  --  360*  BUN 20  --   --   --   --  44*  --   --  48*  --  63*  CREATININE 0.80  --  1.44*  --   --  1.74*  --   --  1.54*  --  1.77*  CALCIUM 8.4*  --   --   --   --  7.2*  --   --  7.4*  --  7.3*  MG  --   --  1.4*  --  1.6* 1.9 2.0  --  2.2  --   --   PHOS  --   --  4.1  --  4.2 4.0 3.5  --  3.2  --   --    < > = values in this interval not displayed.   GFR: Estimated Creatinine Clearance: 52.4 mL/min (A) (by C-G formula based on SCr of 1.77 mg/dL (H)). Liver Function Tests: Recent Labs  Lab 04/11/19 2125 04/13/19 0435 04/14/19 0520 04/15/19 0500  AST 33 63* 104* 74*  ALT 33 48* 87* 82*  ALKPHOS 66 60 65 60  BILITOT 0.4 0.5 0.4 0.3  PROT 6.7 5.7* 6.0* 5.8*  ALBUMIN 3.0* 2.3* 2.4* 2.3*   No results for input(s): LIPASE, AMYLASE in the last 168 hours. No results for input(s): AMMONIA in the last 168 hours. Coagulation Profile: Recent Labs  Lab 04/11/19 2125  INR 1.1   Cardiac Enzymes: No results for input(s): CKTOTAL, CKMB, CKMBINDEX, TROPONINI in the last 168 hours. BNP (last 3 results) No results for input(s): PROBNP in the last 8760 hours. HbA1C: No results for input(s): HGBA1C in the last 72 hours. CBG: Recent Labs  Lab 04/14/19 2057 04/14/19 2332 04/15/19 0443 04/15/19 0745 04/15/19 1129  GLUCAP 267* 254* 337* 272* 331*   Lipid Profile: Recent Labs    04/15/19 0500  TRIG 212*   Thyroid Function Tests: No results for input(s): TSH, T4TOTAL, FREET4, T3FREE, THYROIDAB in the last 72 hours. Anemia Panel: No results for input(s): VITAMINB12, FOLATE, FERRITIN, TIBC, IRON, RETICCTPCT in the last 72 hours. Sepsis Labs: Recent Labs  Lab 04/11/19 2125 04/12/19 0708 04/14/19 1900  PROCALCITON  --  0.32 0.21  LATICACIDVEN 1.2  --   --     Recent Results (from the  past 240 hour(s))  Novel Coronavirus, NAA ([facility])     Status: Abnormal   Collection Time: 04/11/19  8:21 AM   Specimen: Nasal Swab  Result Value Ref Range Status   SARS-CoV-2, NAA Detected (A) Not Detected Final    Comment: This test was developed and its performance characteristics determined by [employer]. This test has not been FDA cleared or approved. This test has been authorized by FDA under an Emergency Use Authorization (EUA). This test is only authorized for the duration of time the declaration that circumstances exist justifying the authorization of the emergency use of in vitro diagnostic tests for detection of SARS-CoV-2 virus and/or diagnosis of COVID-19 infection under section 564(b)(1) of the Act, 21 U.S.C. 952WUX-3(K)(4), unless the authorization is terminated or revoked sooner. When diagnostic testing is negative, the possibility of a false negative result should be considered in the context of a patient's recent exposures and the presence of clinical signs and symptoms consistent with COVID-19. An individual without symptoms of COVID-19 and who is not shedding SARS-CoV-2 virus would expect to have a negative (not detected) result in this assay.   Blood Culture (routine x 2)     Status: None (Preliminary result)   Collection Time: 04/11/19  9:25 PM   Specimen: BLOOD  Result Value Ref Range Status   Specimen Description BLOOD  Final   Special Requests NONE  Final   Culture   Final    NO GROWTH 4 DAYS  Performed at [facility], [address] [place], [place] [address]    Report Status PENDING  Incomplete  Urine culture     Status: None   Collection Time: 04/11/19  9:25 PM   Specimen: In/Out Cath Urine  Result Value Ref Range Status   Specimen Description   Final    IN/OUT CATH URINE Performed at [facility], [address] [place], [place] [address]    Special Requests   Final    NONE Performed at [facility], [address], [place],  [place] [address]    Culture   Final    NO GROWTH Performed at [facility], [place] [address], [place], [place] [address]    Report Status 04/13/2019 FINAL  Final  Blood Culture (routine x 2)     Status: None (Preliminary result)   Collection Time: 04/11/19  9:45 PM   Specimen: BLOOD  Result Value Ref Range Status   Specimen Description BLOOD  Final   Special Requests NONE  Final   Culture   Final    NO GROWTH 4 DAYS Performed at [facility], [address], [place], [place] [address]    Report Status PENDING  Incomplete  SARS Coronavirus 2 [facility] order, Performed in [facility] hospital lab) Nasopharyngeal Nasopharyngeal Swab     Status: Abnormal   Collection Time: 04/11/19  9:50 PM   Specimen: Nasopharyngeal Swab  Result Value Ref Range Status   SARS Coronavirus 2 POSITIVE (A) NEGATIVE Final    Comment: RESULT CALLED TO, READ BACK BY AND VERIFIED WITH: TURNER,C. AT 2356 ON 04/11/2019 (NOTE) If result is NEGATIVE SARS-CoV-2 target nucleic acids are NOT DETECTED. The SARS-CoV-2 RNA is generally detectable in upper and lower  respiratory specimens during the acute phase of infection. The lowest  concentration of SARS-CoV-2 viral copies this assay can detect is 250  copies / mL. A negative result does not preclude SARS-CoV-2 infection  and should not be used as the sole basis for treatment or other  patient management decisions.  A negative result may occur with  improper specimen collection / handling, submission of specimen other  than nasopharyngeal swab, presence of viral mutation(s) within the  areas targeted by this assay, and inadequate number of viral copies  (<250 copies / mL). A negative result must be combined with clinical  observations, patient history, and epidemiological information. If result is POSITIVE SARS-CoV-2 target nucleic acids are DETECTED. The S ARS-CoV-2 RNA is generally detectable in upper and lower  respiratory specimens during the acute phase of  infection.  Positive  results are indicative of active infection with SARS-CoV-2.  Clinical  correlation with patient history and other diagnostic information is  necessary to determine patient infection status.  Positive results do  not rule out bacterial infection or co-infection with other viruses. If result is PRESUMPTIVE POSTIVE SARS-CoV-2 nucleic acids MAY BE PRESENT.   A presumptive positive result was obtained on the submitted specimen  and confirmed on repeat testing.  While 2019 novel coronavirus  (SARS-CoV-2) nucleic acids may be present in the submitted sample  additional confirmatory testing may be necessary for epidemiological  and / or clinical management purposes  to differentiate between  SARS-CoV-2 and other Sarbecovirus currently known to infect humans.  If clinically indicated additional testing with an alternate test  methodology [phone]) is adv ised. The SARS-CoV-2 RNA is generally  detectable in upper and lower respiratory specimens during the acute  phase of infection. The expected result is  Negative. Fact Sheet for Patients:  StrictlyIdeas.no Fact Sheet for Healthcare Providers: BankingDealers.co.za This test is not yet approved or cleared by the [place] FDA and has been authorized for detection and/or diagnosis of SARS-CoV-2 by FDA under an Emergency Use Authorization (EUA).  This EUA will remain in effect (meaning this test can be used) for the duration of the COVID-19 declaration under Section 564(b)(1) of the Act, 21 U.S.C. section 360bbb-3(b)(1), unless the authorization is terminated or revoked sooner. Performed at [facility], [address], [place], [place] [address]   MRSA PCR Screening     Status: None   Collection Time: 04/12/19  6:56 AM   Specimen: Nasal Mucosa; Nasopharyngeal  Result Value Ref Range Status   MRSA by PCR NEGATIVE NEGATIVE Final    Comment:        The GeneXpert MRSA Assay  (FDA approved for NASAL specimens only), is one component of a comprehensive MRSA colonization surveillance program. It is not intended to diagnose MRSA infection nor to guide or monitor treatment for MRSA infections. Performed at [facility], [place] [address], [place], [place] [address]   Expectorated sputum assessment w rflx to resp cult     Status: None   Collection Time: 04/12/19  3:56 PM   Specimen: Tracheal Aspirate; Sputum  Result Value Ref Range Status   Specimen Description TRACHEAL ASPIRATE  Final   Special Requests NONE  Final   Sputum evaluation   Final    THIS SPECIMEN IS ACCEPTABLE FOR SPUTUM CULTURE Performed at [facility], [place] [address] [place], [place] [address]    Report Status 04/12/2019 FINAL  Final  Culture, respiratory     Status: None   Collection Time: 04/12/19  3:56 PM   Specimen: Tracheal Aspirate  Result Value Ref Range Status   Specimen Description   Final    TRACHEAL ASPIRATE Performed at [place] [address], [place], [place] [address]    Special Requests   Final    NONE Reflexed from [id number] Performed at [facility], [place] [address], [place], [place] [address]    Gram Stain   Final    MODERATE WBC PRESENT, PREDOMINANTLY MONONUCLEAR RARE SQUAMOUS EPITHELIAL CELLS PRESENT RARE GRAM POSITIVE COCCI IN CLUSTERS RARE BUDDING YEAST SEEN    Culture   Final    RARE Consistent with normal respiratory flora. Performed at [facility], [place] [address], [place], [place] [address]    Report Status 04/15/2019 FINAL  Final         Radiology Studies: Dg Abd 1 View  Result Date: 04/14/2019 CLINICAL DATA:  OG tube placement. EXAM: ABDOMEN - 1 VIEW COMPARISON:  Radiograph dated 04/13/2019 FINDINGS: The OG tube tip is in the upper body of the stomach, unchanged since the prior exam. Visualized bowel gas pattern is normal. Interstitial accentuation and infiltrates  at the lung bases are stable. IMPRESSION: OG tube tip in the upper body of the stomach.  No change. Electronically Signed   By: Lorriane Shire M.D.   On: 04/14/2019 10:52   Dg Abd 1 View  Result Date: 04/13/2019 CLINICAL DATA:  NG tube placement. EXAM: ABDOMEN - 1 VIEW COMPARISON:  Chest radiograph, 04/12/2019. FINDINGS: Nasal/orogastric tube passes below the diaphragm. Tip projects in the mid stomach. Coarsely thickened interstitial markings noted at the lung bases. IMPRESSION: Well-positioned nasal/orogastric tube. Electronically Signed   By: Lajean Manes M.D.   On: 04/13/2019 18:07   Dg  Chest Port 1 View  Result Date: 04/14/2019 CLINICAL DATA:  Pulmonary infiltrates. Intubation. EXAM: PORTABLE CHEST 1 VIEW COMPARISON:  04/12/2019 and 04/11/2019 FINDINGS: Endotracheal tube is 4.8 cm above the carina. OG tube tip is in the stomach. Central catheter tip is in the SVC at the level of the carina. The bilateral pulmonary infiltrates demonstrate progression in the right mid and lower lung zones and in the left lower lung zone. Heart size and vascularity are within normal limits. Aortic atherosclerosis. Coronary artery stents. No significant bone abnormality. IMPRESSION: 1. Progressive bilateral pulmonary infiltrates. 2. Endotracheal tube is 4.8 cm above the carina. Electronically Signed   By: Lorriane Shire M.D.   On: 04/14/2019 10:55        Scheduled Meds: . arformoterol  15 mcg Nebulization BID  . aspirin  81 mg Oral Daily  . budesonide (PULMICORT) nebulizer solution  0.5 mg Nebulization BID  . chlorhexidine gluconate (MEDLINE KIT)  15 mL Mouth Rinse BID  . Chlorhexidine Gluconate Cloth  6 each Topical Daily  . clonazePAM  2 mg Per Tube BID  . clopidogrel  75 mg Per Tube Daily  . dexamethasone (DECADRON) injection  6 mg Intravenous Q24H  . feeding supplement (PRO-STAT SUGAR FREE 64)  30 mL Per Tube Daily  . folic acid  1 mg Per Tube Daily  . HYDROmorphone  2 mg Oral Q6H  . insulin aspart   0-20 Units Subcutaneous Q4H  . insulin glargine  15 Units Subcutaneous BID  . ipratropium  0.5 mg Nebulization Q6H  . levalbuterol  1.25 mg Nebulization Q6H  . mouth rinse  15 mL Mouth Rinse 10 times per day  . multivitamin  15 mL Per Tube Daily  . pantoprazole sodium  40 mg Per Tube Daily   Continuous Infusions: . ceFEPime (MAXIPIME) IV Stopped (04/15/19 1354)  . dexmedetomidine (PRECEDEX) IV infusion 0.4 mcg/kg/hr (04/15/19 1400)  . feeding supplement (VITAL AF 1.2 CAL) 1,000 mL (04/14/19 1423)  . fentaNYL infusion INTRAVENOUS Stopped (04/15/19 1338)  . heparin 1,300 Units/hr (04/15/19 1400)  . phenylephrine (NEO-SYNEPHRINE) Adult infusion Stopped (04/13/19 0503)  . propofol (DIPRIVAN) infusion Stopped (04/12/19 2000)  . remdesivir 100 mg in NS 250 mL Stopped (04/15/19 0949)     LOS: 3 days    Critical care time: 35 minutes    Kathie Dike, MD [facility]   If 7PM-7AM, please contact night-coverage [email or web address]  04/15/2019, 2:45 PM

## 2019-04-15 NOTE — Progress Notes (Signed)
OT Cancellation Note  Patient Details Name: Robert [name] MRN: [id number] DOB: 04-17-1951   Cancelled Treatment:    Reason Eval/Treat Not Completed: Patient not medically ready (Pt intubated and sedated. Will sign off. Please reconsult as medically ready. Thank you.)  [place], OTR/L Acute Rehab Pager: [phone] Office: [phone] 04/15/2019, 12:54 PM

## 2019-04-15 NOTE — Progress Notes (Signed)
Inpatient Diabetes Program Recommendations  AACE/ADA: New Consensus Statement on Inpatient Glycemic Control (2015)  Target Ranges:  Prepandial:   less than 140 mg/dL      Peak postprandial:   less than 180 mg/dL (1-2 hours)      Critically ill patients:  140 - 180 mg/dL   Results for AMIL, BOUWMAN (MRN [id number]) as of 04/15/2019 09:39  Ref. Range 04/14/2019 07:31 04/14/2019 11:36 04/14/2019 15:19 04/14/2019 20:57 04/14/2019 23:32 04/15/2019 04:43 04/15/2019 07:45  Glucose-Capillary Latest Ref Range: 70 - 99 mg/dL 106 (H) 172 (H) 228 (H) 267 (H) 254 (H) 337 (H) 272 (H)   Review of Glycemic Control  Diabetes history: DM 2 Outpatient Diabetes medications: Glipizide 10 mg bid, Toujeo 80 units Daily, Metformin 1,500 mg qam, 1000 mg qpm  Current orders for Inpatient glycemic control:  Lantus 15 units bid, Novolog Resistant 0-20 units q4  A1c 7.4% on 7/31  Decadron 6 mg Daily   Inpatient Diabetes Program Recommendations:    Glucose trends increased into the 200's. Vital AF 75 ml/hour.  Consider Novolog 5 units Q4 hours Tube Feed Coverage.  Thanks,  Tama Headings RN, MSN, BC-ADM Inpatient Diabetes Coordinator Team Pager [phone] (8a-5p)

## 2019-04-15 NOTE — Progress Notes (Signed)
NAME:  Robert [name], MRN:  [id number], DOB:  1950-12-22, LOS: 3 ADMISSION DATE:  04/11/2019, CONSULTATION DATE:  8/1 REFERRING MD:  Dr. Roderic Palau, CHIEF COMPLAINT:  dyspnea   Brief History   68 y/o male admitted on 8/1 in the setting of ARDS from COVID-19 pneumonia.  Required intubation and mechanical ventilation at an outside hospital, has high sedative needs   Past Medical History  Anxiety COPD GERD CAD Hypertension Hyperlipidemia Rheumatoid arthritis History of systolic heart failure, however 08/2018 Echo showed LVEF 50-55%, left atrium dilated  Significant Hospital Events   8/1 admission to [facility] ICU 8/2 extubated 8/3 early AM agitated, placed on BIPAP  Consults:  PCCM  Procedures:  8/1 ETT > 8/2, 8/3 >  8/1 L subclavian CVL >   Significant Diagnostic Tests:  08/2018 Echo> LVEF 50-55%, left atrium dilated  Micro Data:  7/31 blood >  7/31 sars-cov-2 > positive  Antimicrobials:  7/31 Cefepime >  7/31 Flagyl > 7/31 7/31 Vanc > 7/31 8/1 remdesivir > 8/1 actemra >  8/1 decadron >    Interim history/subjective:   Re-intubated 8/3 Quiet night Some wheezing overnight  Didn't wean this morning  Hasn't had WUA yet Fever overnight  Objective   Blood pressure 126/66, pulse 79, temperature 100 F (37.8 C), temperature source Oral, resp. rate 18, height 6' (1.829 m), weight 106.1 kg, SpO2 98 %. CVP:  [6 mmHg-20 mmHg] 9 mmHg  Vent Mode: PRVC FiO2 (%):  [40 %-60 %] 40 % Set Rate:  [26 bmp] 26 bmp Vt Set:  [460 mL] 460 mL PEEP:  [8 cmH20] 8 cmH20 Plateau Pressure:  [13 cmH20-18 cmH20] 13 cmH20   Intake/Output Summary (Last 24 hours) at 04/15/2019 0744 Last data filed at 04/15/2019 0700 Gross per 24 hour  Intake 2916.87 ml  Output 3580 ml  Net -663.13 ml   Filed Weights   04/11/19 2115 04/13/19 0500 04/15/19 0230  Weight: 102.1 kg 109 kg 106.1 kg    Examination:  General:  In bed on vent HENT: NCAT ETT in place PULM: CTA B, vent supported breathing CV:  RRR, no mgr GI: BS+, soft, nontender MSK: normal bulk and tone Neuro: sedated on vent  8/3 CXR images independently reviewed: worsening dense left lower lobe consolidation, worsening bibasilar air space disease  Resolved Hospital Problem list     Assessment & Plan:  Severe acute respiratory failure with hypoxemia secondary to COVID-19 pneumonia: worsening 8/3, multi-factorial, required re-intubation Continue mechanical ventilation per ARDS protocol Target TVol 6-8cc/kgIBW Target Plateau Pressure < 30cm H20 Target driving pressure less than 15 cm of water Target PaO2 55-65: titrate PEEP/FiO2 per protocol As long as PaO2 to FiO2 ratio is less than 1:150 position in prone position for 16 hours a day Check CVP daily if CVL in place Target CVP less than 4, diurese as necessary Ventilator associated pneumonia prevention protocol 8/3 plan WUA/SBT, diurese with lasix, continue decadron and remdesivir  Acute pulmonary edema: Lasix again today  COPD with acute exacerbation Continue decadron and duoneb  Need for sedation/mechanical ventilation RASS target -2 PAD protocol: precedex and fentanyl infusion   Best practice:  Diet: tube feeding Pain/Anxiety/Delirium protocol (if indicated): as above VAP protocol (if indicated): yes DVT prophylaxis: lovenox GI prophylaxis: Pantoprazole for stress ulcer prophylaxis Glucose control: SSI Mobility: bed rest Code Status: full Family Communication: will discuss with TRH Disposition: remain in ICU  Labs   CBC: Recent Labs  Lab 04/11/19 2125  04/12/19 0708  04/13/19 0435 04/14/19 0102  04/14/19 0520 04/15/19 0401 04/15/19 0500  WBC 4.0  --  6.1  --  3.8*  --  4.9  --  3.7*  NEUTROABS 3.5  --   --   --  2.9  --  3.6  --  2.3  HGB 14.5   < > 12.8*   < > 12.9* 15.0 13.8 12.6* 12.8*  HCT 42.9   < > 38.0*   < > 39.2 44.0 41.6 37.0* 40.0  MCV 87.7  --  90.5  --  90.7  --  90.8  --  91.7  PLT 102*  --  102*  --  104*  --  120*  --  112*    < > = values in this interval not displayed.    Basic Metabolic Panel: Recent Labs  Lab 04/11/19 2125  04/12/19 0708  04/12/19 1610 04/13/19 0435 04/13/19 1625 04/14/19 0343 04/14/19 0520 04/15/19 0401 04/15/19 0500  NA 132*   < >  --    < >  --  135  --  142 143 143 142  K 4.2   < >  --    < >  --  4.0  --  4.2 4.0 4.5 4.6  CL 100  --   --   --   --  104  --   --  112*  --  114*  CO2 22  --   --   --   --  22  --   --  23  --  20*  GLUCOSE 220*  --   --   --   --  196*  --   --  128*  --  360*  BUN 20  --   --   --   --  44*  --   --  48*  --  63*  CREATININE 0.80  --  1.44*  --   --  1.74*  --   --  1.54*  --  1.77*  CALCIUM 8.4*  --   --   --   --  7.2*  --   --  7.4*  --  7.3*  MG  --   --  1.4*  --  1.6* 1.9 2.0  --  2.2  --   --   PHOS  --   --  4.1  --  4.2 4.0 3.5  --  3.2  --   --    < > = values in this interval not displayed.   GFR: Estimated Creatinine Clearance: 52.4 mL/min (A) (by C-G formula based on SCr of 1.77 mg/dL (H)). Recent Labs  Lab 04/11/19 2125 04/12/19 0708 04/13/19 0435 04/14/19 0520 04/14/19 1900 04/15/19 0500  PROCALCITON  --  0.32  --   --  0.21  --   WBC 4.0 6.1 3.8* 4.9  --  3.7*  LATICACIDVEN 1.2  --   --   --   --   --     Liver Function Tests: Recent Labs  Lab 04/11/19 2125 04/13/19 0435 04/14/19 0520 04/15/19 0500  AST 33 63* 104* 74*  ALT 33 48* 87* 82*  ALKPHOS 66 60 65 60  BILITOT 0.4 0.5 0.4 0.3  PROT 6.7 5.7* 6.0* 5.8*  ALBUMIN 3.0* 2.3* 2.4* 2.3*   No results for input(s): LIPASE, AMYLASE in the last 168 hours. No results for input(s): AMMONIA in the last 168 hours.  ABG    Component Value Date/Time   PHART 7.299 (L)  04/15/2019 0401   PCO2ART 40.8 04/15/2019 0401   PO2ART 86.0 04/15/2019 0401   HCO3 20.0 04/15/2019 0401   TCO2 21 (L) 04/15/2019 0401   ACIDBASEDEF 6.0 (H) 04/15/2019 0401   O2SAT 95.0 04/15/2019 0401     Coagulation Profile: Recent Labs  Lab 04/11/19 2125  INR 1.1    Cardiac  Enzymes: No results for input(s): CKTOTAL, CKMB, CKMBINDEX, TROPONINI in the last 168 hours.  HbA1C: Hgb A1c MFr Bld  Date/Time Value Ref Range Status  04/11/2019 09:50 PM 7.4 (H) 4.8 - 5.6 % Final    Comment:    (NOTE) Pre diabetes:          5.7%-6.4% Diabetes:              >6.4% Glycemic control for   <7.0% adults with diabetes   06/07/2017 12:02 AM 9.1 (H) 4.8 - 5.6 % Final    Comment:    (NOTE) Pre diabetes:          5.7%-6.4% Diabetes:              >6.4% Glycemic control for   <7.0% adults with diabetes     CBG: Recent Labs  Lab 04/14/19 1136 04/14/19 1519 04/14/19 2057 04/14/19 2332 04/15/19 0443  GLUCAP 172* 228* 267* 254* 337*     Critical care time: 35 minutes    Roselie Awkward, MD [place] [facility] Pager: [phone] Cell: [phone] If no response, call [phone]

## 2019-04-15 NOTE — Progress Notes (Signed)
ANTICOAGULATION & ANTIBIOTIC CONSULT NOTE - Follow Up Consult  Pharmacy Consult for heparin Indication: atrial fibrillation  Allergies  Allergen Reactions  . Ivp Dye [Iodinated Diagnostic Agents] Itching and Rash    Patient Measurements: Height: 6' (182.9 cm) Weight: 233 lb 14.5 oz (106.1 kg) IBW/kg (Calculated) : 77.6 Heparin Dosing Weight: 98.5 kg  Vital Signs: Temp: 102 F (38.9 C) (08/04 1600) Temp Source: Oral (08/04 1600) BP: 167/86 (08/04 1900) Pulse Rate: 93 (08/04 1900)  Labs: Recent Labs    04/13/19 0435  04/14/19 0520 04/15/19 0401 04/15/19 0500 04/15/19 0850 04/15/19 1700  HGB 12.9*   < > 13.8 12.6* 12.8*  --   --   HCT 39.2   < > 41.6 37.0* 40.0  --   --   PLT 104*  --  120*  --  112*  --   --   HEPARINUNFRC  --   --   --   --   --  0.83* 0.65  CREATININE 1.74*  --  1.54*  --  1.77*  --   --    < > = values in this interval not displayed.    Estimated Creatinine Clearance: 52.4 mL/min (A) (by C-G formula based on SCr of 1.77 mg/dL (H)).   Assessment: 68 yo M on IV heparin for new onset Afib. Heparin level therapeutic (0.65) on 1300 units/hr. No bleeding noted.   Goal of Therapy:  Heparin level 0.3-0.7 units/ml Monitor platelets by anticoagulation protocol: Yes   Plan:  Continue IV heparin at 1300 units/hr  Monitor daily heparin level, CBC, s/s of bleed  Sherlon Handing, PharmD, BCPS [facility] Clinical pharmacist phone [phone] 04/15/2019 7:46 PM

## 2019-04-15 NOTE — Care Management Important Message (Signed)
Important Message  Patient Details  Name: Robert [name] MRN: [id number] Date of Birth: [birth year]   Medicare Important Message Given:  Yes - Important Message mailed due to current National Emergency   Verbal consent obtained due to current National Emergency  Relationship to patient: Child Contact Name: Yasmin Dibello Call Date: 04/15/19  Time: 1511 Phone: [phone] Outcome: Spoke with contact Important Message mailed to: ([address] [place] [address])     Orbie Pyo 04/15/2019, 3:13 PM

## 2019-04-16 DIAGNOSIS — M05719 Rheumatoid arthritis with rheumatoid factor of unspecified shoulder without organ or systems involvement: Secondary | ICD-10-CM

## 2019-04-16 DIAGNOSIS — I2510 Atherosclerotic heart disease of native coronary artery without angina pectoris: Secondary | ICD-10-CM

## 2019-04-16 DIAGNOSIS — E118 Type 2 diabetes mellitus with unspecified complications: Secondary | ICD-10-CM

## 2019-04-16 DIAGNOSIS — E1165 Type 2 diabetes mellitus with hyperglycemia: Secondary | ICD-10-CM

## 2019-04-16 DIAGNOSIS — I4891 Unspecified atrial fibrillation: Secondary | ICD-10-CM

## 2019-04-16 LAB — COMPREHENSIVE METABOLIC PANEL WITH GFR
ALT: 87 U/L — ABNORMAL HIGH (ref 0–44)
AST: 73 U/L — ABNORMAL HIGH (ref 15–41)
Albumin: 2.2 g/dL — ABNORMAL LOW (ref 3.5–5.0)
Alkaline Phosphatase: 56 U/L (ref 38–126)
Anion gap: 4 — ABNORMAL LOW (ref 5–15)
BUN: 63 mg/dL — ABNORMAL HIGH (ref 8–23)
CO2: 23 mmol/L (ref 22–32)
Calcium: 7.1 mg/dL — ABNORMAL LOW (ref 8.9–10.3)
Chloride: 118 mmol/L — ABNORMAL HIGH (ref 98–111)
Creatinine, Ser: 1.69 mg/dL — ABNORMAL HIGH (ref 0.61–1.24)
GFR calc Af Amer: 48 mL/min — ABNORMAL LOW
GFR calc non Af Amer: 42 mL/min — ABNORMAL LOW
Glucose, Bld: 367 mg/dL — ABNORMAL HIGH (ref 70–99)
Potassium: 4.2 mmol/L (ref 3.5–5.1)
Sodium: 145 mmol/L (ref 135–145)
Total Bilirubin: 0.3 mg/dL (ref 0.3–1.2)
Total Protein: 5.7 g/dL — ABNORMAL LOW (ref 6.5–8.1)

## 2019-04-16 LAB — C-REACTIVE PROTEIN: CRP: 2.7 mg/dL — ABNORMAL HIGH

## 2019-04-16 LAB — HEPARIN LEVEL (UNFRACTIONATED)
Heparin Unfractionated: 0.82 IU/mL — ABNORMAL HIGH (ref 0.30–0.70)
Heparin Unfractionated: 0.63 IU/mL (ref 0.30–0.70)

## 2019-04-16 LAB — CULTURE, BLOOD (ROUTINE X 2): Culture: NO GROWTH

## 2019-04-16 LAB — CBC WITH DIFFERENTIAL/PLATELET
Abs Immature Granulocytes: 0.01 10*3/uL (ref 0.00–0.07)
Basophils Absolute: 0 10*3/uL (ref 0.0–0.1)
Basophils Relative: 1 %
Eosinophils Absolute: 0 10*3/uL (ref 0.0–0.5)
Eosinophils Relative: 0 %
HCT: 40 % (ref 39.0–52.0)
Hemoglobin: 12.7 g/dL — ABNORMAL LOW (ref 13.0–17.0)
Immature Granulocytes: 0 %
Lymphocytes Relative: 29 %
Lymphs Abs: 1.3 10*3/uL (ref 0.7–4.0)
MCH: 29.3 pg (ref 26.0–34.0)
MCHC: 31.8 g/dL (ref 30.0–36.0)
MCV: 92.2 fL (ref 80.0–100.0)
Monocytes Absolute: 0.4 10*3/uL (ref 0.1–1.0)
Monocytes Relative: 9 %
Neutro Abs: 2.6 10*3/uL (ref 1.7–7.7)
Neutrophils Relative %: 61 %
Platelets: 111 10*3/uL — ABNORMAL LOW (ref 150–400)
RBC: 4.34 MIL/uL (ref 4.22–5.81)
RDW: 15.9 % — ABNORMAL HIGH (ref 11.5–15.5)
WBC: 4.3 10*3/uL (ref 4.0–10.5)
nRBC: 0 % (ref 0.0–0.2)

## 2019-04-16 LAB — GLUCOSE, CAPILLARY
Glucose-Capillary: 349 mg/dL — ABNORMAL HIGH (ref 70–99)
Glucose-Capillary: 300 mg/dL — ABNORMAL HIGH (ref 70–99)
Glucose-Capillary: 305 mg/dL — ABNORMAL HIGH (ref 70–99)
Glucose-Capillary: 426 mg/dL — ABNORMAL HIGH (ref 70–99)
Glucose-Capillary: 336 mg/dL — ABNORMAL HIGH (ref 70–99)
Glucose-Capillary: 332 mg/dL — ABNORMAL HIGH (ref 70–99)

## 2019-04-16 MED ORDER — FENTANYL CITRATE (PF) 100 MCG/2ML IJ SOLN
25.0000 ug | INTRAMUSCULAR | Status: DC | PRN
  Administered 2019-04-16: 21:00:00 100 ug via INTRAVENOUS
  Administered 2019-04-16: 50 ug via INTRAVENOUS
  Administered 2019-04-16 – 2019-05-02 (×20): 100 ug via INTRAVENOUS
  Filled 2019-04-16 (×22): qty 2

## 2019-04-16 MED ORDER — FUROSEMIDE 10 MG/ML IJ SOLN
40.0000 mg | Freq: Once | INTRAMUSCULAR | Status: AC
  Administered 2019-04-16: 40 mg via INTRAVENOUS
  Filled 2019-04-16: qty 4

## 2019-04-16 MED ORDER — INSULIN ASPART 100 UNIT/ML ~~LOC~~ SOLN
5.0000 [IU] | Freq: Four times a day (QID) | SUBCUTANEOUS | Status: DC
  Administered 2019-04-16 – 2019-04-17 (×4): 5 [IU] via SUBCUTANEOUS

## 2019-04-16 MED ORDER — INSULIN GLARGINE 100 UNIT/ML ~~LOC~~ SOLN
30.0000 [IU] | Freq: Two times a day (BID) | SUBCUTANEOUS | Status: DC
  Administered 2019-04-16 – 2019-04-17 (×2): 30 [IU] via SUBCUTANEOUS
  Filled 2019-04-16 (×3): qty 0.3

## 2019-04-16 MED ORDER — INSULIN ASPART 100 UNIT/ML ~~LOC~~ SOLN: 5.0000 [IU] | Freq: Four times a day (QID) | SUBCUTANEOUS | Status: DC | PRN

## 2019-04-16 NOTE — Progress Notes (Addendum)
CBG 426. Notified Woods, MD. Orders for increase in Lantus and scheduled Novalog.

## 2019-04-16 NOTE — Progress Notes (Signed)
SLP Cancellation Note  Patient Details Name: JETTIE LAZARE MRN: [id number] DOB: 1951-05-31   Cancelled treatment:       Reason Eval/Treat Not Completed: Medical issues which prohibited therapy - pt remains intubated, drowsy. SLP to sign off for now. Please reorder when able.    Venita Sheffield Deicy Rusk 04/16/2019, 1:54 PM  Pollyann Glen, M.A. [facility] Environmental education officer [phone] Office [phone]

## 2019-04-16 NOTE — Progress Notes (Signed)
NAME:  Robert [name], MRN:  [id number], DOB:  1951-04-14, LOS: 4 ADMISSION DATE:  04/11/2019, CONSULTATION DATE:  8/1 REFERRING MD:  Dr. Roderic Palau, CHIEF COMPLAINT:  dyspnea   Brief History   68 y/o male admitted on 8/1 in the setting of ARDS from COVID-19 pneumonia.  Required intubation and mechanical ventilation at an outside hospital, has high sedative needs   Past Medical History  Anxiety COPD GERD CAD Hypertension Hyperlipidemia Rheumatoid arthritis History of systolic heart failure, however 08/2018 Echo showed LVEF 50-55%, left atrium dilated  Significant Hospital Events   8/1 admission to [facility] ICU 8/2 extubated 8/3 early AM agitated, placed on BIPAP  Consults:  PCCM  Procedures:  8/1 ETT > 8/2, 8/3 >  8/1 L subclavian CVL >   Significant Diagnostic Tests:  08/2018 Echo> LVEF 50-55%, left atrium dilated  Micro Data:  7/31 blood >  7/31 sars-cov-2 > positive  Antimicrobials:  7/31 Cefepime >  7/31 Flagyl > 7/31 7/31 Vanc > 7/31 8/1 remdesivir > 8/1 actemra >  8/1 decadron >    Interim history/subjective:   Weaning on vent but drowsy Remains on vent Otherwise no acute events In sinus rhythm  Objective   Blood pressure (!) 111/57, pulse 76, temperature 98.3 F (36.8 C), temperature source Oral, resp. rate 16, height 6' (1.829 m), weight 106.1 kg, SpO2 97 %. CVP:  [1 mmHg-8 mmHg] 7 mmHg  Vent Mode: PRVC FiO2 (%):  [30 %-40 %] 30 % Set Rate:  [26 bmp] 26 bmp Vt Set:  [460 mL] 460 mL PEEP:  [5 cmH20] 5 cmH20 Pressure Support:  [10 cmH20] 10 cmH20 Plateau Pressure:  [14 cmH20-16 cmH20] 16 cmH20   Intake/Output Summary (Last 24 hours) at 04/16/2019 0801 Last data filed at 04/16/2019 0700 Gross per 24 hour  Intake 2843.79 ml  Output 2635 ml  Net 208.79 ml   Filed Weights   04/11/19 2115 04/13/19 0500 04/15/19 0230  Weight: 102.1 kg 109 kg 106.1 kg    Examination:  General:  In bed on vent HENT: NCAT ETT in place PULM: CTA B, vent supported  breathing CV: RRR, no mgr GI: BS+, soft, nontender MSK: normal bulk and tone Neuro: sedated on vent  8/3 CXR images independently reviewed: worsening dense left lower lobe consolidation, worsening bibasilar air space disease  Resolved Hospital Problem list     Assessment & Plan:  Severe acute respiratory failure with hypoxemia secondary to COVID-19 pneumonia: improved 8/5, weaning on vent today 8/5 plan: continue WUA/SBT as long as tolerated, furosemide again If not awake enough for extubation, when resuming mechanical ventilation per ARDS protocol: Target TVol 6-8cc/kgIBW Target Plateau Pressure < 30cm H20 Target driving pressure less than 15 cm of water Target PaO2 55-65: titrate PEEP/FiO2 per protocol As long as PaO2 to FiO2 ratio is less than 1:150 position in prone position for 16 hours a day Check CVP daily if CVL in place Target CVP less than 4, diurese as necessary Ventilator associated pneumonia prevention protocol  Acute pulmonary edema: Lasix again today  COPD with acute exacerbation Continue decadron and duoneb  Need for sedation/mechanical ventilation Change RASS target to 0 to -1 Change fentanyl to prn Continue precedex   Best practice:  Diet: tube feeding Pain/Anxiety/Delirium protocol (if indicated): as above VAP protocol (if indicated): yes DVT prophylaxis: lovenox GI prophylaxis: Pantoprazole for stress ulcer prophylaxis Glucose control: SSI Mobility: bed rest Code Status: full Family Communication: will discuss with TRH Disposition: remain in ICU  Labs  CBC: Recent Labs  Lab 04/11/19 2125  04/12/19 0708  04/13/19 0435 04/14/19 0343 04/14/19 0520 04/15/19 0401 04/15/19 0500 04/16/19 0505  WBC 4.0  --  6.1  --  3.8*  --  4.9  --  3.7* 4.3  NEUTROABS 3.5  --   --   --  2.9  --  3.6  --  2.3 2.6  HGB 14.5   < > 12.8*   < > 12.9* 15.0 13.8 12.6* 12.8* 12.7*  HCT 42.9   < > 38.0*   < > 39.2 44.0 41.6 37.0* 40.0 40.0  MCV 87.7  --  90.5   --  90.7  --  90.8  --  91.7 92.2  PLT 102*  --  102*  --  104*  --  120*  --  112* 111*   < > = values in this interval not displayed.    Basic Metabolic Panel: Recent Labs  Lab 04/11/19 2125  04/12/19 0708  04/12/19 1610 04/13/19 0435 04/13/19 1625 04/14/19 0343 04/14/19 0520 04/15/19 0401 04/15/19 0500 04/16/19 0505  NA 132*   < >  --    < >  --  135  --  142 143 143 142 145  K 4.2   < >  --    < >  --  4.0  --  4.2 4.0 4.5 4.6 4.2  CL 100  --   --   --   --  104  --   --  112*  --  114* 118*  CO2 22  --   --   --   --  22  --   --  23  --  20* 23  GLUCOSE 220*  --   --   --   --  196*  --   --  128*  --  360* 367*  BUN 20  --   --   --   --  44*  --   --  48*  --  63* 63*  CREATININE 0.80  --  1.44*  --   --  1.74*  --   --  1.54*  --  1.77* 1.69*  CALCIUM 8.4*  --   --   --   --  7.2*  --   --  7.4*  --  7.3* 7.1*  MG  --   --  1.4*  --  1.6* 1.9 2.0  --  2.2  --   --   --   PHOS  --   --  4.1  --  4.2 4.0 3.5  --  3.2  --   --   --    < > = values in this interval not displayed.   GFR: Estimated Creatinine Clearance: 54.9 mL/min (A) (by C-G formula based on SCr of 1.69 mg/dL (H)). Recent Labs  Lab 04/11/19 2125 04/12/19 0708 04/13/19 0435 04/14/19 0520 04/14/19 1900 04/15/19 0500 04/16/19 0505  PROCALCITON  --  0.32  --   --  0.21  --   --   WBC 4.0 6.1 3.8* 4.9  --  3.7* 4.3  LATICACIDVEN 1.2  --   --   --   --   --   --     Liver Function Tests: Recent Labs  Lab 04/11/19 2125 04/13/19 0435 04/14/19 0520 04/15/19 0500 04/16/19 0505  AST 33 63* 104* 74* 73*  ALT 33 48* 87* 82* 87*  ALKPHOS 66 60 65 60 56  BILITOT 0.4 0.5 0.4 0.3 0.3  PROT 6.7 5.7* 6.0* 5.8* 5.7*  ALBUMIN 3.0* 2.3* 2.4* 2.3* 2.2*   No results for input(s): LIPASE, AMYLASE in the last 168 hours. No results for input(s): AMMONIA in the last 168 hours.  ABG    Component Value Date/Time   PHART 7.299 (L) 04/15/2019 0401   PCO2ART 40.8 04/15/2019 0401   PO2ART 86.0 04/15/2019 0401    HCO3 20.0 04/15/2019 0401   TCO2 21 (L) 04/15/2019 0401   ACIDBASEDEF 6.0 (H) 04/15/2019 0401   O2SAT 95.0 04/15/2019 0401     Coagulation Profile: Recent Labs  Lab 04/11/19 2125  INR 1.1    Cardiac Enzymes: No results for input(s): CKTOTAL, CKMB, CKMBINDEX, TROPONINI in the last 168 hours.  HbA1C: Hgb A1c MFr Bld  Date/Time Value Ref Range Status  04/11/2019 09:50 PM 7.4 (H) 4.8 - 5.6 % Final    Comment:    (NOTE) Pre diabetes:          5.7%-6.4% Diabetes:              >6.4% Glycemic control for   <7.0% adults with diabetes   06/07/2017 12:02 AM 9.1 (H) 4.8 - 5.6 % Final    Comment:    (NOTE) Pre diabetes:          5.7%-6.4% Diabetes:              >6.4% Glycemic control for   <7.0% adults with diabetes     CBG: Recent Labs  Lab 04/15/19 1517 04/15/19 1935 04/15/19 2307 04/16/19 0343 04/16/19 0738  GLUCAP 396* 337* 281* 349* 300*     Critical care time: 45 minutes    Roselie Awkward, MD [place] [facility] Pager: [phone] Cell: [phone] If no response, call [phone]

## 2019-04-16 NOTE — Progress Notes (Signed)
PROGRESS NOTE    Robert [name]  TSV:[id number] DOB: 1950-11-18 DOA: 04/11/2019 PCP: Redmond School, MD   Brief Narrative:  68 year old WM PMHx COPD, coronary artery disease, diabetes, ischemic cardiomyopathy, rheumatoid arthritis,   Presents to the hospital with complaints of progressive worsening shortness of breath.  He was mildly febrile and had worsening shortness of breath so he called EMS.  In the ER, he was noted to be substantially hypoxic, had mental status changes.  He tested positive for COVID-19.  He was intubated in the emergency room for persistent hypoxia.  He has been transferred to [facility] for further management.   Subjective: A/O x0, sedated   Assessment & Plan:   Active Problems:   Rheumatoid arthritis ([place])   COPD (chronic obstructive pulmonary disease) (HCC)   Controlled type 2 diabetes mellitus with diabetic peripheral angiopathy without gangrene, with long-term current use of insulin (HCC)   Essential hypertension   Ischemic cardiomyopathy   Coronary artery disease   Dyslipidemia (high LDL; low HDL)   COVID-19 virus detected   Acute respiratory distress syndrome (ARDS) due to COVID-19 virus   Atrial fibrillation with RVR (HCC)  Acute respiratory failure with hypoxia/COVID pneumonia - 8/1 Remdesivir, Actemra, Decadron - Extubated 8/2--> on 8/3 required reintubation - Reintubation was thought secondary to a bacterial superinfection overlaid on his COVID pneumonia -Appropriate antibiotics started see below -Titrate sedating medication down;  COPD - See acute respiratory failure  A. fib with RVR - Currently NSR -CHADSVASc score of 5, currently on heparin - 8/5 Lasix 40 mg x 1 -Metoprolol 25 mg twice daily - Strict in and out -Daily weight  Ischemic cardiomyopathy - See A. Fib -Patient had episode of hypotension and was placed on phenyl ephedrine has now been weaned off. -8/5 CVP= 7  Essential HTN -Metoprolol 25 mg twice daily   CAD/stents -Aspirin + Plavix  AKI (baseline Cr~0.8) -Mostly multifactorial to include infection, hypotension/hypoperfusion Recent Labs  Lab 04/12/19 0708 04/13/19 0435 04/14/19 0520 04/15/19 0500 04/16/19 0505  CREATININE 1.44* 1.74* 1.54* 1.77* 1.69*  -Monitor closely  Rheumatoid arthritis -Chronically on methotrexate and Actemra  Diabetes type 2 uncontrolled -7/3 1 hemoglobin A1c= 7.4 - 8/5 increase Lantus 30 units twice daily  - 8/5 NovoLog 5 units QID - Resistant SSI     DVT prophylaxis: Heparin Code Status: Full Family Communication:  Disposition Plan: TBD   Consultants:  PCCM  Procedures/Significant Events:     I have personally reviewed and interpreted all radiology studies and my findings are as above.  VENTILATOR SETTINGS: Ventilator mode: PSV, CPAP FiO2, 30% PS: 10 PEEP: 5   Cultures   Antimicrobials: Anti-infectives (From admission, onward)   Start     Stop   04/13/19 1000  remdesivir 100 mg in sodium chloride 0.9 % 250 mL IVPB     04/16/19 1103   04/12/19 1000  remdesivir 200 mg in sodium chloride 0.9 % 250 mL IVPB     04/12/19 1317   04/12/19 0600  ceFEPIme (MAXIPIME) 2 g in sodium chloride 0.9 % 100 mL IVPB  Status:  Discontinued     04/15/19 1452   04/12/19 0200  vancomycin (VANCOCIN) IVPB 750 mg/150 ml premix  Status:  Discontinued     04/12/19 1455   04/11/19 2130  ceFEPIme (MAXIPIME) 2 g in sodium chloride 0.9 % 100 mL IVPB     04/11/19 2257   04/11/19 2130  metroNIDAZOLE (FLAGYL) IVPB 500 mg     04/11/19 2356  04/11/19 2130  vancomycin (VANCOCIN) IVPB 1000 mg/200 mL premix     04/12/19 0105       Devices    LINES / TUBES:      Continuous Infusions: . dexmedetomidine (PRECEDEX) IV infusion 0.6 mcg/kg/hr (04/16/19 0700)  . feeding supplement (VITAL AF 1.2 CAL) 1,000 mL (04/14/19 1423)  . fentaNYL infusion INTRAVENOUS 100 mcg/hr (04/16/19 0700)  . heparin 1,150 Units/hr (04/16/19 0700)  . phenylephrine  (NEO-SYNEPHRINE) Adult infusion Stopped (04/13/19 0503)  . propofol (DIPRIVAN) infusion Stopped (04/12/19 2000)  . remdesivir 100 mg in NS 250 mL Stopped (04/15/19 0949)     Objective: Vitals:   04/16/19 0400 04/16/19 0500 04/16/19 0600 04/16/19 0700  BP: 124/62 111/69 (!) 128/58 (!) 111/57  Pulse: 75 73 73 76  Resp: '17 19 19 16  '$ Temp: 98.3 F (36.8 C)     TempSrc: Oral     SpO2: 97% 97% 97% 97%  Weight:      Height:        Intake/Output Summary (Last 24 hours) at 04/16/2019 0757 Last data filed at 04/16/2019 0700 Gross per 24 hour  Intake 3044.53 ml  Output 2950 ml  Net 94.53 ml   Filed Weights   04/11/19 2115 04/13/19 0500 04/15/19 0230  Weight: 102.1 kg 109 kg 106.1 kg    Examination:  General: A/O x0 (sedated), positive acute respiratory distress Eyes: negative scleral hemorrhage, negative anisocoria, negative icterus ENT: Negative Runny nose, negative gingival bleeding, Neck:  Negative scars, masses, torticollis, lymphadenopathy, JVD Lungs: Clear to auscultation bilaterally without wheezes or crackles Cardiovascular: Irregular irregular rhythm and rate without murmur gallop or rub normal S1 and S2 Abdomen: negative abdominal pain, nondistended, positive soft, bowel sounds, no rebound, no ascites, no appreciable mass Extremities: No significant cyanosis, clubbing, or edema bilateral lower extremities Skin: Negative rashes, lesions, ulcers Psychiatric: Unable to assess secondary to sedation and intubation Central nervous system: Unable to assess secondary to sedation and intubation  .     Data Reviewed: Care during the described time interval was provided by me .  I have reviewed this patient's available data, including medical history, events of note, physical examination, and all test results as part of my evaluation.   CBC: Recent Labs  Lab 04/11/19 2125  04/12/19 0708  04/13/19 0435 04/14/19 0343 04/14/19 0520 04/15/19 0401 04/15/19 0500 04/16/19 0505   WBC 4.0  --  6.1  --  3.8*  --  4.9  --  3.7* 4.3  NEUTROABS 3.5  --   --   --  2.9  --  3.6  --  2.3 2.6  HGB 14.5   < > 12.8*   < > 12.9* 15.0 13.8 12.6* 12.8* 12.7*  HCT 42.9   < > 38.0*   < > 39.2 44.0 41.6 37.0* 40.0 40.0  MCV 87.7  --  90.5  --  90.7  --  90.8  --  91.7 92.2  PLT 102*  --  102*  --  104*  --  120*  --  112* 111*   < > = values in this interval not displayed.   Basic Metabolic Panel: Recent Labs  Lab 04/11/19 2125  04/12/19 0708  04/12/19 1610 04/13/19 0435 04/13/19 1625 04/14/19 0343 04/14/19 0520 04/15/19 0401 04/15/19 0500 04/16/19 0505  NA 132*   < >  --    < >  --  135  --  142 143 143 142 145  K 4.2   < >  --    < >  --  4.0  --  4.2 4.0 4.5 4.6 4.2  CL 100  --   --   --   --  104  --   --  112*  --  114* 118*  CO2 22  --   --   --   --  22  --   --  23  --  20* 23  GLUCOSE 220*  --   --   --   --  196*  --   --  128*  --  360* 367*  BUN 20  --   --   --   --  44*  --   --  48*  --  63* 63*  CREATININE 0.80  --  1.44*  --   --  1.74*  --   --  1.54*  --  1.77* 1.69*  CALCIUM 8.4*  --   --   --   --  7.2*  --   --  7.4*  --  7.3* 7.1*  MG  --   --  1.4*  --  1.6* 1.9 2.0  --  2.2  --   --   --   PHOS  --   --  4.1  --  4.2 4.0 3.5  --  3.2  --   --   --    < > = values in this interval not displayed.   GFR: Estimated Creatinine Clearance: 54.9 mL/min (A) (by C-G formula based on SCr of 1.69 mg/dL (H)). Liver Function Tests: Recent Labs  Lab 04/11/19 2125 04/13/19 0435 04/14/19 0520 04/15/19 0500 04/16/19 0505  AST 33 63* 104* 74* 73*  ALT 33 48* 87* 82* 87*  ALKPHOS 66 60 65 60 56  BILITOT 0.4 0.5 0.4 0.3 0.3  PROT 6.7 5.7* 6.0* 5.8* 5.7*  ALBUMIN 3.0* 2.3* 2.4* 2.3* 2.2*   No results for input(s): LIPASE, AMYLASE in the last 168 hours. No results for input(s): AMMONIA in the last 168 hours. Coagulation Profile: Recent Labs  Lab 04/11/19 2125  INR 1.1   Cardiac Enzymes: No results for input(s): CKTOTAL, CKMB, CKMBINDEX, TROPONINI  in the last 168 hours. BNP (last 3 results) No results for input(s): PROBNP in the last 8760 hours. HbA1C: No results for input(s): HGBA1C in the last 72 hours. CBG: Recent Labs  Lab 04/15/19 1517 04/15/19 1935 04/15/19 2307 04/16/19 0343 04/16/19 0738  GLUCAP 396* 337* 281* 349* 300*   Lipid Profile: Recent Labs    04/15/19 0500  TRIG 212*   Thyroid Function Tests: No results for input(s): TSH, T4TOTAL, FREET4, T3FREE, THYROIDAB in the last 72 hours. Anemia Panel: No results for input(s): VITAMINB12, FOLATE, FERRITIN, TIBC, IRON, RETICCTPCT in the last 72 hours. Urine analysis:    Component Value Date/Time   COLORURINE YELLOW 04/11/2019 2125   APPEARANCEUR CLEAR 04/11/2019 2125   LABSPEC 1.025 04/11/2019 2125   PHURINE 5.0 04/11/2019 2125   GLUCOSEU 50 (A) 04/11/2019 2125   HGBUR SMALL (A) 04/11/2019 2125   BILIRUBINUR NEGATIVE 04/11/2019 2125   KETONESUR 5 (A) 04/11/2019 2125   PROTEINUR >=300 (A) 04/11/2019 2125   UROBILINOGEN 0.2 12/22/2011 2130   NITRITE NEGATIVE 04/11/2019 2125   LEUKOCYTESUR NEGATIVE 04/11/2019 2125   Sepsis Labs: '@LABRCNTIP'$ (procalcitonin:4,lacticidven:4)  ) Recent Results (from the past 240 hour(s))  Novel Coronavirus, NAA ([facility])     Status: Abnormal   Collection Time: 04/11/19  8:21 AM   Specimen: Nasal Swab  Result Value Ref Range Status   SARS-CoV-2,  NAA Detected (A) Not Detected Final    Comment: This test was developed and its performance characteristics determined by [employer]. This test has not been FDA cleared or approved. This test has been authorized by FDA under an Emergency Use Authorization (EUA). This test is only authorized for the duration of time the declaration that circumstances exist justifying the authorization of the emergency use of in vitro diagnostic tests for detection of SARS-CoV-2 virus and/or diagnosis of COVID-19 infection under section 564(b)(1) of the Act, 21 U.S.C. 761YWV-3(X)(1), unless  the authorization is terminated or revoked sooner. When diagnostic testing is negative, the possibility of a false negative result should be considered in the context of a patient's recent exposures and the presence of clinical signs and symptoms consistent with COVID-19. An individual without symptoms of COVID-19 and who is not shedding SARS-CoV-2 virus would expect to have a negative (not detected) result in this assay.   Blood Culture (routine x 2)     Status: None (Preliminary result)   Collection Time: 04/11/19  9:25 PM   Specimen: BLOOD  Result Value Ref Range Status   Specimen Description BLOOD  Final   Special Requests NONE  Final   Culture   Final    NO GROWTH 4 DAYS Performed at [facility], [address], [place], [place] [address]    Report Status PENDING  Incomplete  Urine culture     Status: None   Collection Time: 04/11/19  9:25 PM   Specimen: In/Out Cath Urine  Result Value Ref Range Status   Specimen Description   Final    IN/OUT CATH URINE Performed at [facility], [address], [place], [place] [address]    Special Requests   Final    NONE Performed at [facility], [address] [place], [place] [address]    Culture   Final    NO GROWTH Performed at [facility], [place] [address] [place], [place] [address]    Report Status 04/13/2019 FINAL  Final  Blood Culture (routine x 2)     Status: None (Preliminary result)   Collection Time: 04/11/19  9:45 PM   Specimen: BLOOD  Result Value Ref Range Status   Specimen Description BLOOD  Final   Special Requests NONE  Final   Culture   Final    NO GROWTH 4 DAYS Performed at [facility], [address], [place], [place] [address]    Report Status PENDING  Incomplete  SARS Coronavirus 2 [facility] order, Performed in [facility] hospital lab) Nasopharyngeal Nasopharyngeal Swab     Status: Abnormal   Collection Time: 04/11/19  9:50 PM   Specimen: Nasopharyngeal Swab  Result Value Ref Range Status    SARS Coronavirus 2 POSITIVE (A) NEGATIVE Final    Comment: RESULT CALLED TO, READ BACK BY AND VERIFIED WITH: TURNER,C. AT 2356 ON 04/11/2019 (NOTE) If result is NEGATIVE SARS-CoV-2 target nucleic acids are NOT DETECTED. The SARS-CoV-2 RNA is generally detectable in upper and lower  respiratory specimens during the acute phase of infection. The lowest  concentration of SARS-CoV-2 viral copies this assay can detect is 250  copies / mL. A negative result does not preclude SARS-CoV-2 infection  and should not be used as the sole basis for treatment or other  patient management decisions.  A negative result may occur with  improper specimen collection / handling, submission of specimen other  than nasopharyngeal swab, presence of viral mutation(s) within the  areas targeted by this  assay, and inadequate number of viral copies  (<250 copies / mL). A negative result must be combined with clinical  observations, patient history, and epidemiological information. If result is POSITIVE SARS-CoV-2 target nucleic acids are DETECTED. The S ARS-CoV-2 RNA is generally detectable in upper and lower  respiratory specimens during the acute phase of infection.  Positive  results are indicative of active infection with SARS-CoV-2.  Clinical  correlation with patient history and other diagnostic information is  necessary to determine patient infection status.  Positive results do  not rule out bacterial infection or co-infection with other viruses. If result is PRESUMPTIVE POSTIVE SARS-CoV-2 nucleic acids MAY BE PRESENT.   A presumptive positive result was obtained on the submitted specimen  and confirmed on repeat testing.  While 2019 novel coronavirus  (SARS-CoV-2) nucleic acids may be present in the submitted sample  additional confirmatory testing may be necessary for epidemiological  and / or clinical management purposes  to differentiate between  SARS-CoV-2 and other Sarbecovirus currently known to  infect humans.  If clinically indicated additional testing with an alternate test  methodology [id number]) is adv ised. The SARS-CoV-2 RNA is generally  detectable in upper and lower respiratory specimens during the acute  phase of infection. The expected result is Negative. Fact Sheet for Patients:  StrictlyIdeas.no Fact Sheet for Healthcare Providers: BankingDealers.co.za This test is not yet approved or cleared by the [place] FDA and has been authorized for detection and/or diagnosis of SARS-CoV-2 by FDA under an Emergency Use Authorization (EUA).  This EUA will remain in effect (meaning this test can be used) for the duration of the COVID-19 declaration under Section 564(b)(1) of the Act, 21 U.S.C. section 360bbb-3(b)(1), unless the authorization is terminated or revoked sooner. Performed at [facility], [address] [place], [place] [address]   MRSA PCR Screening     Status: None   Collection Time: 04/12/19  6:56 AM   Specimen: Nasal Mucosa; Nasopharyngeal  Result Value Ref Range Status   MRSA by PCR NEGATIVE NEGATIVE Final    Comment:        The GeneXpert MRSA Assay (FDA approved for NASAL specimens only), is one component of a comprehensive MRSA colonization surveillance program. It is not intended to diagnose MRSA infection nor to guide or monitor treatment for MRSA infections. Performed at [facility], [place] [address], [place], [place] [address]   Expectorated sputum assessment w rflx to resp cult     Status: None   Collection Time: 04/12/19  3:56 PM   Specimen: Tracheal Aspirate; Sputum  Result Value Ref Range Status   Specimen Description TRACHEAL ASPIRATE  Final   Special Requests NONE  Final   Sputum evaluation   Final    THIS SPECIMEN IS ACCEPTABLE FOR SPUTUM CULTURE Performed at [facility], [place] [address] [place], [place] [address]    Report Status 04/12/2019  FINAL  Final  Culture, respiratory     Status: None   Collection Time: 04/12/19  3:56 PM   Specimen: Tracheal Aspirate  Result Value Ref Range Status   Specimen Description   Final    TRACHEAL ASPIRATE Performed at [place] [address], [place], [place] [address]    Special Requests   Final    NONE Reflexed from [id number] Performed at [facility], [place] [address], [place], [place] [address]    Gram Stain   Final    MODERATE WBC PRESENT, PREDOMINANTLY MONONUCLEAR RARE  SQUAMOUS EPITHELIAL CELLS PRESENT RARE GRAM POSITIVE COCCI IN CLUSTERS RARE BUDDING YEAST SEEN    Culture   Final    RARE Consistent with normal respiratory flora. Performed at [facility], [place] [address] [place], [place] [address]    Report Status 04/15/2019 FINAL  Final         Radiology Studies: Dg Abd 1 View  Result Date: 04/14/2019 CLINICAL DATA:  OG tube placement. EXAM: ABDOMEN - 1 VIEW COMPARISON:  Radiograph dated 04/13/2019 FINDINGS: The OG tube tip is in the upper body of the stomach, unchanged since the prior exam. Visualized bowel gas pattern is normal. Interstitial accentuation and infiltrates at the lung bases are stable. IMPRESSION: OG tube tip in the upper body of the stomach.  No change. Electronically Signed   By: Lorriane Shire M.D.   On: 04/14/2019 10:52   Dg Chest Port 1 View  Result Date: 04/14/2019 CLINICAL DATA:  Pulmonary infiltrates. Intubation. EXAM: PORTABLE CHEST 1 VIEW COMPARISON:  04/12/2019 and 04/11/2019 FINDINGS: Endotracheal tube is 4.8 cm above the carina. OG tube tip is in the stomach. Central catheter tip is in the SVC at the level of the carina. The bilateral pulmonary infiltrates demonstrate progression in the right mid and lower lung zones and in the left lower lung zone. Heart size and vascularity are within normal limits. Aortic atherosclerosis. Coronary artery stents. No significant bone abnormality. IMPRESSION: 1.  Progressive bilateral pulmonary infiltrates. 2. Endotracheal tube is 4.8 cm above the carina. Electronically Signed   By: Lorriane Shire M.D.   On: 04/14/2019 10:55        Scheduled Meds: . arformoterol  15 mcg Nebulization BID  . aspirin  81 mg Oral Daily  . budesonide (PULMICORT) nebulizer solution  0.5 mg Nebulization BID  . chlorhexidine gluconate (MEDLINE KIT)  15 mL Mouth Rinse BID  . Chlorhexidine Gluconate Cloth  6 each Topical Daily  . clonazePAM  2 mg Per Tube BID  . clopidogrel  75 mg Per Tube Daily  . dexamethasone (DECADRON) injection  6 mg Intravenous Q24H  . feeding supplement (PRO-STAT SUGAR FREE 64)  30 mL Per Tube Daily  . folic acid  1 mg Per Tube Daily  . HYDROmorphone  2 mg Oral Q6H  . insulin aspart  0-20 Units Subcutaneous Q4H  . insulin glargine  25 Units Subcutaneous BID  . ipratropium  0.5 mg Nebulization Q6H  . levalbuterol  1.25 mg Nebulization Q6H WA  . mouth rinse  15 mL Mouth Rinse 10 times per day  . metoprolol tartrate  25 mg Per Tube BID  . multivitamin  15 mL Per Tube Daily  . pantoprazole sodium  40 mg Per Tube Daily   Continuous Infusions: . dexmedetomidine (PRECEDEX) IV infusion 0.6 mcg/kg/hr (04/16/19 0700)  . feeding supplement (VITAL AF 1.2 CAL) 1,000 mL (04/14/19 1423)  . fentaNYL infusion INTRAVENOUS 100 mcg/hr (04/16/19 0700)  . heparin 1,150 Units/hr (04/16/19 0700)  . phenylephrine (NEO-SYNEPHRINE) Adult infusion Stopped (04/13/19 0503)  . propofol (DIPRIVAN) infusion Stopped (04/12/19 2000)  . remdesivir 100 mg in NS 250 mL Stopped (04/15/19 0949)     LOS: 4 days   The patient is critically ill with multiple organ systems failure and requires high complexity decision making for assessment and support, frequent evaluation and titration of therapies, application of advanced monitoring technologies and extensive interpretation of multiple databases. Critical Care Time devoted to patient care services described in this note  Time  spent: 40 minutes  Allie Bossier, MD [facility] Pager [phone]  If 7PM-7AM, please contact night-coverage [email or web address] Password [email or web address] 04/16/2019, 7:57 AM

## 2019-04-16 NOTE — Progress Notes (Signed)
ANTICOAGULATION & ANTIBIOTIC CONSULT NOTE - Follow Up Consult  Pharmacy Consult for heparin Indication: atrial fibrillation  Allergies  Allergen Reactions  . Ivp Dye [Iodinated Diagnostic Agents] Itching and Rash    Patient Measurements: Height: 6' (182.9 cm) Weight: 233 lb 14.5 oz (106.1 kg) IBW/kg (Calculated) : 77.6 Heparin Dosing Weight: 98.5 kg  Vital Signs: Temp: 102.7 F (39.3 C) (08/05 1200) Temp Source: Oral (08/05 1200) BP: 155/51 (08/05 1400) Pulse Rate: 89 (08/05 1400)  Labs: Recent Labs    04/14/19 0520 04/15/19 0401 04/15/19 0500  04/15/19 1700 04/16/19 0505 04/16/19 1348  HGB 13.8 12.6* 12.8*  --   --  12.7*  --   HCT 41.6 37.0* 40.0  --   --  40.0  --   PLT 120*  --  112*  --   --  111*  --   HEPARINUNFRC  --   --   --    < > 0.65 0.82* 0.63  CREATININE 1.54*  --  1.77*  --   --  1.69*  --    < > = values in this interval not displayed.    Estimated Creatinine Clearance: 54.9 mL/min (A) (by C-G formula based on SCr of 1.69 mg/dL (H)).  Assessment: 68 yo M on IV heparin for new onset Afib. Heparin level is now therapeutic at 0.63. No bleeding noted. Hgb stable, plts low but stable.  Goal of Therapy:  Heparin level 0.3-0.7 units/ml Monitor platelets by anticoagulation protocol: Yes   Plan:  Continue IV heparin at 1150 units/hr   Monitor daily heparin level, CBC, s/s of bleed  Albertina Parr, PharmD., BCPS Clinical Pharmacist Clinical phone for 01/11/19 until 5pm: [phone]

## 2019-04-16 NOTE — Progress Notes (Signed)
Updated pt's daughter on plan of care tonight. Answered all questions and concerns.  Margaret Pyle, RN

## 2019-04-16 NOTE — Progress Notes (Signed)
Called and updated patient's daughter.

## 2019-04-16 NOTE — Progress Notes (Signed)
ANTICOAGULATION & ANTIBIOTIC CONSULT NOTE - Follow Up Consult  Pharmacy Consult for heparin Indication: atrial fibrillation  Allergies  Allergen Reactions  . Ivp Dye [Iodinated Diagnostic Agents] Itching and Rash    Patient Measurements: Height: 6' (182.9 cm) Weight: 233 lb 14.5 oz (106.1 kg) IBW/kg (Calculated) : 77.6 Heparin Dosing Weight: 98.5 kg  Vital Signs: Temp: 99.5 F (37.5 C) (08/05 0000) Temp Source: Oral (08/05 0000) BP: 124/62 (08/05 0400) Pulse Rate: 75 (08/05 0400)  Labs: Recent Labs    04/14/19 0520 04/15/19 0401 04/15/19 0500 04/15/19 0850 04/15/19 1700 04/16/19 0505  HGB 13.8 12.6* 12.8*  --   --  12.7*  HCT 41.6 37.0* 40.0  --   --  40.0  PLT 120*  --  112*  --   --  111*  HEPARINUNFRC  --   --   --  0.83* 0.65 0.82*  CREATININE 1.54*  --  1.77*  --   --   --     Estimated Creatinine Clearance: 52.4 mL/min (A) (by C-G formula based on SCr of 1.77 mg/dL (H)).  Assessment: 68 yo M on IV heparin for new onset Afib. Heparin level therapeutic trended up to 0.82 on 1300 units/hr. No bleeding noted. Hgb stable, plts low but stable.  Goal of Therapy:  Heparin level 0.3-0.7 units/ml Monitor platelets by anticoagulation protocol: Yes   Plan:  Decrease IV heparin gtt to 1,150 units/hr  Recheck 6 hr heparin level Monitor daily heparin level, CBC, s/s of bleed  Consider switch to therapeutic enoxaparin?  Elenor Quinones, PharmD, BCPS, BCIDP Clinical Pharmacist 04/16/2019 6:01 AM

## 2019-04-16 NOTE — Progress Notes (Signed)
Inpatient Diabetes Program Recommendations  AACE/ADA: New Consensus Statement on Inpatient Glycemic Control (2015)  Target Ranges:  Prepandial:   less than 140 mg/dL      Peak postprandial:   less than 180 mg/dL (1-2 hours)      Critically ill patients:  140 - 180 mg/dL   Results for ZYMIERE, TROSTLE (MRN [id number]) as of 04/16/2019 07:14  Ref. Range 04/14/2019 23:32 04/15/2019 04:43 04/15/2019 07:45 04/15/2019 11:29 04/15/2019 15:17 04/15/2019 19:35  Glucose-Capillary Latest Ref Range: 70 - 99 mg/dL 254 (H)  11 units NOVOLOG  337 (H)  15 units NOVOLOG  272 (H)  11 units NOVOLOG +  15 units LANTUS  331 (H)  15 units NOVOLOG  396 (H)  20 units NOVOLOG  337 (H)  8 units NOVOLOG +  25 units LANTUS    Results for JERMALE, CRASS (MRN [id number]) as of 04/16/2019 07:14  Ref. Range 04/15/2019 23:07 04/16/2019 03:43  Glucose-Capillary Latest Ref Range: 70 - 99 mg/dL 281 (H)  11 units NOVOLOG  349 (H)  15 units NOVOLOG       Home DM Meds: Acarbose 100 mg TID       Glipizide 10 mg BID       Toujeo 80 units Daily       Metformin 1500 mg AM/ 1000 mg PM  Current Orders: Lantus 25 units BID      Novolog Resistant Correction Scale/ SSI (0-20 units) Q4 hours     Remains Intubated.  Getting Decadron 6 mg Daily.  Also getting Tube Feeds 75cc/hr.    MD- Note that Lantus increased to 25 units BID yesterday afternoon (pt received a total of 40 units Lantus yesterday 08/04--Will get total of 50 units Lantus today)  May also consider adding Novolog Tube Feed Coverage:  Novolog 8 units Q4 hours  HOLD if Tube Feeds HELD for any reason     --Will follow patient during hospitalization--  Wyn Quaker RN, MSN, CDE Diabetes Coordinator Inpatient Glycemic Control Team Team Pager: [phone] (8a-5p)

## 2019-04-17 DIAGNOSIS — J431 Panlobular emphysema: Secondary | ICD-10-CM

## 2019-04-17 DIAGNOSIS — M05721 Rheumatoid arthritis with rheumatoid factor of right elbow without organ or systems involvement: Secondary | ICD-10-CM

## 2019-04-17 LAB — COMPREHENSIVE METABOLIC PANEL
ALT: 103 U/L — ABNORMAL HIGH (ref 0–44)
AST: 82 U/L — ABNORMAL HIGH (ref 15–41)
Albumin: 2.3 g/dL — ABNORMAL LOW (ref 3.5–5.0)
Alkaline Phosphatase: 56 U/L (ref 38–126)
Anion gap: 7 (ref 5–15)
BUN: 72 mg/dL — ABNORMAL HIGH (ref 8–23)
CO2: 23 mmol/L (ref 22–32)
Calcium: 7.3 mg/dL — ABNORMAL LOW (ref 8.9–10.3)
Chloride: 119 mmol/L — ABNORMAL HIGH (ref 98–111)
Creatinine, Ser: 1.69 mg/dL — ABNORMAL HIGH (ref 0.61–1.24)
GFR calc Af Amer: 48 mL/min — ABNORMAL LOW (ref >60)
GFR calc non Af Amer: 42 mL/min — ABNORMAL LOW (ref >60)
Glucose, Bld: 435 mg/dL — ABNORMAL HIGH (ref 70–99)
Potassium: 4.8 mmol/L (ref 3.5–5.1)
Sodium: 149 mmol/L — ABNORMAL HIGH (ref 135–145)
Total Bilirubin: 0.6 mg/dL (ref 0.3–1.2)
Total Protein: 5.7 g/dL — ABNORMAL LOW (ref 6.5–8.1)

## 2019-04-17 LAB — CBC WITH DIFFERENTIAL/PLATELET
Abs Immature Granulocytes: 0.02 10*3/uL (ref 0.00–0.07)
Basophils Absolute: 0 10*3/uL (ref 0.0–0.1)
Basophils Relative: 1 %
Eosinophils Absolute: 0 10*3/uL (ref 0.0–0.5)
Eosinophils Relative: 0 %
HCT: 42.2 % (ref 39.0–52.0)
Hemoglobin: 13.5 g/dL (ref 13.0–17.0)
Immature Granulocytes: 0 %
Lymphocytes Relative: 23 %
Lymphs Abs: 1.2 10*3/uL (ref 0.7–4.0)
MCH: 29.7 pg (ref 26.0–34.0)
MCHC: 32 g/dL (ref 30.0–36.0)
MCV: 93 fL (ref 80.0–100.0)
Monocytes Absolute: 0.5 10*3/uL (ref 0.1–1.0)
Monocytes Relative: 10 %
Neutro Abs: 3.6 10*3/uL (ref 1.7–7.7)
Neutrophils Relative %: 66 %
Platelets: 104 10*3/uL — ABNORMAL LOW (ref 150–400)
RBC: 4.54 MIL/uL (ref 4.22–5.81)
RDW: 16.4 % — ABNORMAL HIGH (ref 11.5–15.5)
WBC: 5.5 10*3/uL (ref 4.0–10.5)
nRBC: 0 % (ref 0.0–0.2)

## 2019-04-17 LAB — MAGNESIUM: Magnesium: 2.8 mg/dL — ABNORMAL HIGH (ref 1.7–2.4)

## 2019-04-17 LAB — CULTURE, BLOOD (ROUTINE X 2)
Culture: NO GROWTH
Special Requests: ADEQUATE

## 2019-04-17 LAB — GLUCOSE, CAPILLARY
Glucose-Capillary: 316 mg/dL — ABNORMAL HIGH (ref 70–99)
Glucose-Capillary: 332 mg/dL — ABNORMAL HIGH (ref 70–99)
Glucose-Capillary: 341 mg/dL — ABNORMAL HIGH (ref 70–99)
Glucose-Capillary: 358 mg/dL — ABNORMAL HIGH (ref 70–99)
Glucose-Capillary: 359 mg/dL — ABNORMAL HIGH (ref 70–99)
Glucose-Capillary: 372 mg/dL — ABNORMAL HIGH (ref 70–99)

## 2019-04-17 LAB — HEPARIN LEVEL (UNFRACTIONATED)
Heparin Unfractionated: 0.75 IU/mL — ABNORMAL HIGH (ref 0.30–0.70)
Heparin Unfractionated: 0.57 IU/mL (ref 0.30–0.70)

## 2019-04-17 LAB — C-REACTIVE PROTEIN: CRP: 2.3 mg/dL — ABNORMAL HIGH (ref <1.0)

## 2019-04-17 MED ORDER — "THROMBI-PAD 3""X3"" EX PADS"
1.0000 | MEDICATED_PAD | Freq: Once | CUTANEOUS | Status: AC
  Administered 2019-04-17: 1 via TOPICAL
  Filled 2019-04-17: qty 1

## 2019-04-17 MED ORDER — INSULIN ASPART 100 UNIT/ML ~~LOC~~ SOLN
10.0000 [IU] | Freq: Four times a day (QID) | SUBCUTANEOUS | Status: DC
  Administered 2019-04-17 – 2019-04-18 (×4): 10 [IU] via SUBCUTANEOUS

## 2019-04-17 MED ORDER — INSULIN GLARGINE 100 UNIT/ML ~~LOC~~ SOLN
40.0000 [IU] | Freq: Two times a day (BID) | SUBCUTANEOUS | Status: DC
  Administered 2019-04-17 – 2019-04-18 (×2): 40 [IU] via SUBCUTANEOUS
  Filled 2019-04-17 (×3): qty 0.4

## 2019-04-17 MED ORDER — DEXMEDETOMIDINE HCL IN NACL 400 MCG/100ML IV SOLN
0.0000 ug/kg/h | INTRAVENOUS | Status: AC
  Administered 2019-04-18 (×2): 0.7 ug/kg/h via INTRAVENOUS
  Administered 2019-04-18: 0.3 ug/kg/h via INTRAVENOUS
  Administered 2019-04-19: 07:00:00 0.5 ug/kg/h via INTRAVENOUS
  Administered 2019-04-19 – 2019-04-20 (×2): 0.3 ug/kg/h via INTRAVENOUS
  Filled 2019-04-17 (×7): qty 100

## 2019-04-17 MED ORDER — HEPARIN (PORCINE) 25000 UT/250ML-% IV SOLN
1050.0000 [IU]/h | INTRAVENOUS | Status: DC
  Administered 2019-04-17 – 2019-04-20 (×4): 1050 [IU]/h via INTRAVENOUS
  Filled 2019-04-17 (×4): qty 250

## 2019-04-17 MED ORDER — HYDRALAZINE HCL 20 MG/ML IJ SOLN
10.0000 mg | INTRAMUSCULAR | Status: DC | PRN
  Administered 2019-04-17: 10 mg via INTRAVENOUS
  Administered 2019-04-18 – 2019-05-19 (×3): 20 mg via INTRAVENOUS
  Filled 2019-04-17 (×3): qty 1

## 2019-04-17 MED ORDER — METHYLPREDNISOLONE SODIUM SUCC 40 MG IJ SOLR
40.0000 mg | Freq: Two times a day (BID) | INTRAMUSCULAR | Status: DC
  Administered 2019-04-17 – 2019-04-19 (×5): 40 mg via INTRAVENOUS
  Filled 2019-04-17 (×5): qty 1

## 2019-04-17 MED ORDER — FUROSEMIDE 10 MG/ML IJ SOLN
60.0000 mg | Freq: Four times a day (QID) | INTRAMUSCULAR | Status: AC
  Administered 2019-04-17 (×2): 60 mg via INTRAVENOUS
  Filled 2019-04-17 (×2): qty 6

## 2019-04-17 NOTE — Progress Notes (Signed)
ANTICOAGULATION & ANTIBIOTIC CONSULT NOTE - Follow Up Consult  Pharmacy Consult for heparin Indication: atrial fibrillation  Allergies  Allergen Reactions  . Ivp Dye [Iodinated Diagnostic Agents] Itching and Rash    Patient Measurements: Height: 6' (182.9 cm) Weight: 235 lb 0.2 oz (106.6 kg) IBW/kg (Calculated) : 77.6 Heparin Dosing Weight: 98.5 kg  Vital Signs: Temp: 98 F (36.7 C) (08/06 0300) Temp Source: Oral (08/06 0300) BP: 122/70 (08/06 0500) Pulse Rate: 74 (08/06 0500)  Labs: Recent Labs    04/15/19 0500  04/16/19 0505 04/16/19 1348 04/17/19 0445  HGB 12.8*  --  12.7*  --  13.5  HCT 40.0  --  40.0  --  42.2  PLT 112*  --  111*  --  104*  HEPARINUNFRC  --    < > 0.82* 0.63 0.75*  CREATININE 1.77*  --  1.69*  --   --    < > = values in this interval not displayed.    Estimated Creatinine Clearance: 55 mL/min (A) (by C-G formula based on SCr of 1.69 mg/dL (H)).  Assessment: 68 yo M on IV heparin for new onset Afib. Heparin level is now therapeutic at 0.63. No bleeding noted. Hgb stable, plts low but stable.  Today, 04/17/19   HL 0.75 slightly supratherapeutic  Hgb 13.5, WNL  plt 104, low but stable and consistent with previous labs   No line or bleeding issues per RN    Goal of Therapy:  Heparin level 0.3-0.7 units/ml Monitor platelets by anticoagulation protocol: Yes   Plan:  Decrease IV heparin to 1050 units/hr   Obtain HL 6 hours after rate change  Monitor daily heparin level, CBC, s/s of bleed   Royetta Asal, PharmD, BCPS 04/17/2019 6:19 AM

## 2019-04-17 NOTE — Progress Notes (Signed)
Daughter called and update provided.

## 2019-04-17 NOTE — Progress Notes (Signed)
ANTICOAGULATION & ANTIBIOTIC CONSULT NOTE - Follow Up Consult  Pharmacy Consult for heparin Indication: atrial fibrillation  Allergies  Allergen Reactions  . Ivp Dye [Iodinated Diagnostic Agents] Itching and Rash    Patient Measurements: Height: 6' (182.9 cm) Weight: 235 lb 0.2 oz (106.6 kg) IBW/kg (Calculated) : 77.6 Heparin Dosing Weight: 98.5 kg  Vital Signs: Temp: 100.8 F (38.2 C) (08/06 1115) Temp Source: Oral (08/06 0300) BP: 175/79 (08/06 1218) Pulse Rate: 104 (08/06 1218)  Labs: Recent Labs    04/15/19 0500  04/16/19 0505 04/16/19 1348 04/17/19 0445 04/17/19 1230  HGB 12.8*  --  12.7*  --  13.5  --   HCT 40.0  --  40.0  --  42.2  --   PLT 112*  --  111*  --  104*  --   HEPARINUNFRC  --    < > 0.82* 0.63 0.75* 0.57  CREATININE 1.77*  --  1.69*  --  1.69*  --    < > = values in this interval not displayed.    Estimated Creatinine Clearance: 55 mL/min (A) (by C-G formula based on SCr of 1.69 mg/dL (H)).  Assessment: 68 yo M on IV heparin for new onset Afib. Heparin level is now therapeutic at 0.57 on 1050 units/hr of IV heparin. Hgb stable, plts low but stable. Of note, patient is experience some bleeding near the central line site. Thrombi-pad ordered for now but heparin continues to run. MD aware.    Goal of Therapy:  Heparin level 0.3-0.7 units/ml Monitor platelets by anticoagulation protocol: Yes   Plan:  Continue IV heparin at 1050 units/hr   Monitor daily heparin level, CBC, s/s of bleed   Albertina Parr, PharmD., BCPS Clinical Pharmacist Clinical phone for 04/17/19 until 5pm: [phone]

## 2019-04-17 NOTE — Progress Notes (Signed)
Inpatient Diabetes Program Recommendations  AACE/ADA: New Consensus Statement on Inpatient Glycemic Control (2015)  Target Ranges:  Prepandial:   less than 140 mg/dL      Peak postprandial:   less than 180 mg/dL (1-2 hours)      Critically ill patients:  140 - 180 mg/dL        Home DM Meds: Acarbose 100 mg TID       Glipizide 10 mg BID       Toujeo 80 units Daily       Metformin 1500 mg AM/ 1000 mg PM  Current Orders: Lantus 30 units BID      Novolog Resistant Correction Scale/ SSI (0-20 units) Q4 hours      Novolog 5 units Q4 hours Tube Feed Coverage     Remains Intubated.  Getting Decadron 6 mg Daily.  Also getting Tube Feeds 75cc/hr.    MD- Note that Lantus increased to 30 units BID yesterday afternoon   Total of Novolog 131 units in a 24 hour period  Consider increasing Lantus to 40 units bid  Increasing Novolog Tube Feed Coverage, Novolog 12 units Q4 hours  HOLD if Tube Feeds HELD for any reason    --Will follow patient during hospitalization--  Tama Headings RN, MSN, BC-ADM Inpatient Diabetes Coordinator Team Pager [phone] (8a-5p)

## 2019-04-17 NOTE — Progress Notes (Signed)
Nutrition Follow-up RD working remotely.  DOCUMENTATION CODES:   Obesity unspecified  INTERVENTION:   Continue TF via OGT / Cortrak:  Vital AF 1.2 at 75 ml/h   Pro-stat 30 ml once daily  Provides: 2260 kcal, 150 gm protein, 1460 ml free water daily  Recommend adjust DM medications for improved glucose control.  NUTRITION DIAGNOSIS:   Inadequate oral intake related to inability to eat as evidenced by NPO status.  Ongoing   GOAL:   Provide needs based on ASPEN/SCCM guidelines  Met with TF  MONITOR:   Vent status, TF tolerance, Labs, Weight trends  ASSESSMENT:   68 yo male admitted with ARDS from COVID-19 pneumonia. PMH includes HLD, anxiety, ischemic cardiomyopathy, CAD, COPD, RA, HTN, DM-on insulin.  Patient remains intubated on ventilator support MV: 16.5 L/min Temp (24hrs), Avg:100.9 F (38.3 C), Min:98 F (36.7 C), Max:102.7 F (39.3 C)   Receiving TF via OGT: Vital AF 1.2 at 75 ml/h with Pro-stat 30 ml once daily, providing 2260 kcal, 150 gm protein, 1460 ml free water daily. Patient is tolerating TF well at this time.  Cortrak has been ordered, to be placed Friday, 8/7.   Labs reviewed. Sodium 149 (H) CBG's: [phone]  Medications reviewed and include decadron, folic acid, lasix, novolog, lantus, MVI.   Weight up by 4.5 kg since admission I/O net +5.7 L Patient with generalized non-pitting edema and +1 mild pitting perineal edema per RN documentation.   Diet Order:   Diet Order            Diet NPO time specified  Diet effective now              EDUCATION NEEDS:   No education needs have been identified at this time  Skin:  Skin Assessment: Skin Integrity Issues: Skin Integrity Issues:: DTI, Unstageable DTI: sacrum, buttocks Unstageable: buttocks  Last BM:  8/6 type 7  Height:   Ht Readings from Last 1 Encounters:  04/12/19 6' (1.829 m)    Weight:   Wt Readings from Last 1 Encounters:  04/17/19 106.6 kg   04/11/19  102.1  kg   BMI=30.5    Ideal Body Weight:  80.9 kg  BMI:  Body mass index is 31.87 kg/m.  Estimated Nutritional Needs:   Kcal:  2200  Protein:  150-175 gm  Fluid:  2 L    Molli Barrows, RD, LDN, [place] Pager [phone] After Hours Pager [phone]

## 2019-04-17 NOTE — Progress Notes (Signed)
1000 - central line site bleeding.  Pressure held for 10 mins and oozing decreased.  1200 - central line site continues to ooze.  Discussed with MD McQuaid and thrombi pad ordered  1400 - thrombi pad placed.    1700 - patient continues to ooze from central line site and patient also has blood tinged secretions from ETT.  Called and discussed with pharmacy, patient is on heparin drip but has a history of A-fib.  Pharmacy advised to continue to watch patient's bleeding and if bleeding increases, RN will need to discuss heparin drip with MD.     1800 - pressure held on central line site for 10 mins.

## 2019-04-17 NOTE — Progress Notes (Signed)
NAME:  Robert [name], MRN:  [id number], DOB:  May 01, 1951, LOS: 5 ADMISSION DATE:  04/11/2019, CONSULTATION DATE:  8/1 REFERRING MD:  Dr. Roderic Palau, CHIEF COMPLAINT:  dyspnea   Brief History   68 y/o male admitted on 8/1 in the setting of ARDS from COVID-19 pneumonia.  Required intubation and mechanical ventilation at an outside hospital, has high sedative needs   Past Medical History  Anxiety COPD GERD CAD Hypertension Hyperlipidemia Rheumatoid arthritis History of systolic heart failure, however 08/2018 Echo showed LVEF 50-55%, left atrium dilated  Significant Hospital Events   8/1 admission to [facility] ICU 8/2 extubated 8/3 early AM agitated, placed on BIPAP  Consults:  PCCM  Procedures:  8/1 ETT > 8/2, 8/3 >  8/1 L subclavian CVL >   Significant Diagnostic Tests:  08/2018 Echo> LVEF 50-55%, left atrium dilated  Micro Data:  7/31 blood >  7/31 sars-cov-2 > positive  Antimicrobials:  7/31 Cefepime >  7/31 Flagyl > 7/31 7/31 Vanc > 7/31 8/1 remdesivir > 8/1 actemra >  8/1 decadron >    Interim history/subjective:   Didn't wean this morning More awake today Required prn fentanyl, precedex overnight  Objective   Blood pressure 121/71, pulse 80, temperature 100.2 F (37.9 C), resp. rate (!) 25, height 6' (1.829 m), weight 106.6 kg, SpO2 98 %. CVP:  [0 mmHg-8 mmHg] 0 mmHg  Vent Mode: PRVC FiO2 (%):  [30 %] 30 % Set Rate:  [26 bmp] 26 bmp Vt Set:  [460 mL] 460 mL PEEP:  [5 cmH20] 5 cmH20 Pressure Support:  [10 cmH20] 10 cmH20 Plateau Pressure:  [15 cmH20] 15 cmH20   Intake/Output Summary (Last 24 hours) at 04/17/2019 0804 Last data filed at 04/17/2019 0700 Gross per 24 hour  Intake 3014.01 ml  Output 3950 ml  Net -935.99 ml   Filed Weights   04/13/19 0500 04/15/19 0230 04/17/19 0118  Weight: 109 kg 106.1 kg 106.6 kg    Examination:  General:  In bed on vent HENT: NCAT ETT in place PULM: CTA B, vent supported breathing CV: RRR, no mgr GI: BS+, soft,  nontender MSK: normal bulk and tone Neuro: sedated on vent  8/3 CXR images independently reviewed: worsening dense left lower lobe consolidation, worsening bibasilar air space disease  Resolved Hospital Problem list     Assessment & Plan:  Severe acute respiratory failure with hypoxemia secondary to COVID-19 pneumonia: Likely COPD exacerbation Improved 8/5, weaning on vent today, not strong enough or awake enough to wean, steroids: solumedrol for COPD exacerbation  8/6 attempt to wean to pressure support today Continue mechanical ventilation per ARDS protocol Target TVol 6-8cc/kgIBW Target Plateau Pressure < 30cm H20 Target driving pressure less than 15 cm of water Target PaO2 55-65: titrate PEEP/FiO2 per protocol As long as PaO2 to FiO2 ratio is less than 1:150 position in prone position for 16 hours a day Check CVP daily if CVL in place Target CVP less than 4, diurese as necessary Ventilator associated pneumonia prevention protocol  Acute pulmonary edema: Lasix again today  COPD with acute exacerbation Continue decadron and duoneb  Need for sedation/mechanical ventilation RASS target to 0 to -1 Fentanyl prn Continue precedex   Best practice:  Diet: tube feeding Pain/Anxiety/Delirium protocol (if indicated): as above VAP protocol (if indicated): yes DVT prophylaxis: lovenox GI prophylaxis: Pantoprazole for stress ulcer prophylaxis Glucose control: SSI Mobility: bed rest Code Status: full Family Communication: will discuss with [facility] Disposition: remain in ICU  Labs   CBC: Recent  Labs  Lab 04/13/19 0435  04/14/19 0520 04/15/19 0401 04/15/19 0500 04/16/19 0505 04/17/19 0445  WBC 3.8*  --  4.9  --  3.7* 4.3 5.5  NEUTROABS 2.9  --  3.6  --  2.3 2.6 3.6  HGB 12.9*   < > 13.8 12.6* 12.8* 12.7* 13.5  HCT 39.2   < > 41.6 37.0* 40.0 40.0 42.2  MCV 90.7  --  90.8  --  91.7 92.2 93.0  PLT 104*  --  120*  --  112* 111* 104*   < > = values in this interval not  displayed.    Basic Metabolic Panel: Recent Labs  Lab 04/12/19 0708  04/12/19 1610 04/13/19 0435 04/13/19 1625  04/14/19 0520 04/15/19 0401 04/15/19 0500 04/16/19 0505 04/17/19 0445  NA  --    < >  --  135  --    < > 143 143 142 145 149*  K  --    < >  --  4.0  --    < > 4.0 4.5 4.6 4.2 4.8  CL  --   --   --  104  --   --  112*  --  114* 118* 119*  CO2  --   --   --  22  --   --  23  --  20* 23 23  GLUCOSE  --   --   --  196*  --   --  128*  --  360* 367* 435*  BUN  --   --   --  44*  --   --  48*  --  63* 63* 72*  CREATININE 1.44*  --   --  1.74*  --   --  1.54*  --  1.77* 1.69* 1.69*  CALCIUM  --   --   --  7.2*  --   --  7.4*  --  7.3* 7.1* 7.3*  MG 1.4*  --  1.6* 1.9 2.0  --  2.2  --   --   --  2.8*  PHOS 4.1  --  4.2 4.0 3.5  --  3.2  --   --   --   --    < > = values in this interval not displayed.   GFR: Estimated Creatinine Clearance: 55 mL/min (A) (by C-G formula based on SCr of 1.69 mg/dL (H)). Recent Labs  Lab 04/11/19 2125 04/12/19 0708  04/14/19 0520 04/14/19 1900 04/15/19 0500 04/16/19 0505 04/17/19 0445  PROCALCITON  --  0.32  --   --  0.21  --   --   --   WBC 4.0 6.1   < > 4.9  --  3.7* 4.3 5.5  LATICACIDVEN 1.2  --   --   --   --   --   --   --    < > = values in this interval not displayed.    Liver Function Tests: Recent Labs  Lab 04/13/19 0435 04/14/19 0520 04/15/19 0500 04/16/19 0505 04/17/19 0445  AST 63* 104* 74* 73* 82*  ALT 48* 87* 82* 87* 103*  ALKPHOS 60 65 60 56 56  BILITOT 0.5 0.4 0.3 0.3 0.6  PROT 5.7* 6.0* 5.8* 5.7* 5.7*  ALBUMIN 2.3* 2.4* 2.3* 2.2* 2.3*   No results for input(s): LIPASE, AMYLASE in the last 168 hours. No results for input(s): AMMONIA in the last 168 hours.  ABG    Component Value Date/Time   PHART 7.299 (L) 04/15/2019  0401   PCO2ART 40.8 04/15/2019 0401   PO2ART 86.0 04/15/2019 0401   HCO3 20.0 04/15/2019 0401   TCO2 21 (L) 04/15/2019 0401   ACIDBASEDEF 6.0 (H) 04/15/2019 0401   O2SAT 95.0  04/15/2019 0401     Coagulation Profile: Recent Labs  Lab 04/11/19 2125  INR 1.1    Cardiac Enzymes: No results for input(s): CKTOTAL, CKMB, CKMBINDEX, TROPONINI in the last 168 hours.  HbA1C: Hgb A1c MFr Bld  Date/Time Value Ref Range Status  04/11/2019 09:50 PM 7.4 (H) 4.8 - 5.6 % Final    Comment:    (NOTE) Pre diabetes:          5.7%-6.4% Diabetes:              >6.4% Glycemic control for   <7.0% adults with diabetes   06/07/2017 12:02 AM 9.1 (H) 4.8 - 5.6 % Final    Comment:    (NOTE) Pre diabetes:          5.7%-6.4% Diabetes:              >6.4% Glycemic control for   <7.0% adults with diabetes     CBG: Recent Labs  Lab 04/16/19 1544 04/16/19 1939 04/16/19 2317 04/17/19 0328 04/17/19 0733  GLUCAP 426* 336* 332* 359* 372*     Critical care time: 35 minutes    Roselie Awkward, MD [place] [facility] Pager: [phone] Cell: [phone] If no response, call [phone]

## 2019-04-17 NOTE — Progress Notes (Addendum)
PROGRESS NOTE    Robert [name]  CHE:[id number] DOB: 09/16/1950 DOA: 04/11/2019 PCP: Redmond School, MD   Brief Narrative:  68 year old WM PMHx COPD, coronary artery disease, diabetes, ischemic cardiomyopathy, rheumatoid arthritis,   Presents to the hospital with complaints of progressive worsening shortness of breath.  He was mildly febrile and had worsening shortness of breath so he called EMS.  In the ER, he was noted to be substantially hypoxic, had mental status changes.  He tested positive for COVID-19.  He was intubated in the emergency room for persistent hypoxia.  He has been transferred to [facility] for further management.   Subjective: 8/6 A/O x0 sedated/intubated   Assessment & Plan:   Active Problems:   Rheumatoid arthritis ([place])   COPD (chronic obstructive pulmonary disease) (HCC)   Controlled type 2 diabetes mellitus with diabetic peripheral angiopathy without gangrene, with long-term current use of insulin (HCC)   Essential hypertension   Ischemic cardiomyopathy   Coronary artery disease   Dyslipidemia (high LDL; low HDL)   COVID-19 virus detected   Acute respiratory distress syndrome (ARDS) due to COVID-19 virus   Atrial fibrillation with RVR (HCC)  Acute respiratory failure with hypoxia/COVID pneumonia - 8/1 Remdesivir, Actemra, Decadron - Extubated 8/2--> on 8/3 required reintubation - Reintubation was thought secondary to a bacterial superinfection overlaid on his COVID pneumonia -Appropriate antibiotics started see below -Titrate sedating medication down; - 8/6 we will give patient 3-day burst of high-dose steroids.  Solu-Medrol 40 mg BID  COPD - See acute respiratory failure  A. fib with RVR - Currently NSR -CHADSVASc score of 5, currently on heparin - 8/5 Lasix 40 mg x 1 -Metoprolol 25 mg twice daily - Strict in and out +5.6 L -Daily weight Filed Weights   04/13/19 0500 04/15/19 0230 04/17/19 0118  Weight: 109 kg 106.1 kg 106.6 kg   - Daily CVP  8/6 CVP= 2-3  Ischemic cardiomyopathy - See A. Fib -Patient had episode of hypotension and was placed on phenyl ephedrine has now been weaned off. - See A. fib RVR  Essential HTN -See A. fib RVR  CAD/stents -Aspirin + Plavix  AKI (baseline Cr~0.8) -Mostly multifactorial to include infection, hypotension/hypoperfusion Recent Labs  Lab 04/13/19 0435 04/14/19 0520 04/15/19 0500 04/16/19 0505 04/17/19 0445  CREATININE 1.74* 1.54* 1.77* 1.69* 1.69*  -Monitor closely  Rheumatoid arthritis -Chronically on methotrexate and Actemra  Diabetes type 2 uncontrolled -7/3 1 hemoglobin A1c= 7.4 - 8/6 increase Lantus 40 unitsBID  - 8/6 increase NovoLog 10 units QID - Resistant SSI     DVT prophylaxis: Heparin Code Status: Full Family Communication: Spoke with Dortha Kern daughter and discussed plan of care, answered all questions. Disposition Plan: TBD   Consultants:  PCCM  Procedures/Significant Events:     I have personally reviewed and interpreted all radiology studies and my findings are as above.  VENTILATOR SETTINGS: Ventilator mode: PSV, CPAP FiO2, 30% PS: 12 PEEP: 5   Cultures   Antimicrobials: Anti-infectives (From admission, onward)   Start     Stop   04/13/19 1000  remdesivir 100 mg in sodium chloride 0.9 % 250 mL IVPB     04/16/19 1103   04/12/19 1000  remdesivir 200 mg in sodium chloride 0.9 % 250 mL IVPB     04/12/19 1317   04/12/19 0600  ceFEPIme (MAXIPIME) 2 g in sodium chloride 0.9 % 100 mL IVPB  Status:  Discontinued     04/15/19 1452   04/12/19 0200  vancomycin (  VANCOCIN) IVPB 750 mg/150 ml premix  Status:  Discontinued     04/12/19 1455   04/11/19 2130  ceFEPIme (MAXIPIME) 2 g in sodium chloride 0.9 % 100 mL IVPB     04/11/19 2257   04/11/19 2130  metroNIDAZOLE (FLAGYL) IVPB 500 mg     04/11/19 2356   04/11/19 2130  vancomycin (VANCOCIN) IVPB 1000 mg/200 mL premix     04/12/19 0105       Devices    LINES /  TUBES:      Continuous Infusions: . dexmedetomidine (PRECEDEX) IV infusion 0.6 mcg/kg/hr (04/17/19 0700)  . feeding supplement (VITAL AF 1.2 CAL) 1,000 mL (04/17/19 0147)  . heparin 1,050 Units/hr (04/17/19 0700)  . phenylephrine (NEO-SYNEPHRINE) Adult infusion Stopped (04/13/19 0503)     Objective: Vitals:   04/17/19 0500 04/17/19 0600 04/17/19 0700 04/17/19 0730  BP: 122/70 134/78 121/71   Pulse: 74 87 80   Resp: (!) 24 (!) 24 (!) 25   Temp:    100.2 F (37.9 C)  TempSrc:      SpO2: 96% 97% 98%   Weight:      Height:        Intake/Output Summary (Last 24 hours) at 04/17/2019 0758 Last data filed at 04/17/2019 0700 Gross per 24 hour  Intake 3122.75 ml  Output 4350 ml  Net -1227.25 ml   Filed Weights   04/13/19 0500 04/15/19 0230 04/17/19 0118  Weight: 109 kg 106.1 kg 106.6 kg   Physical Exam:  General: A/O x0 (sedated/intubated), positive acute respiratory distress Eyes: negative scleral hemorrhage, negative anisocoria, negative icterus ENT: Negative Runny nose, negative gingival bleeding, Neck:  Negative scars, masses, torticollis, lymphadenopathy, JVD Lungs: Tachypneic, decreased bibasilar breath sounds, without wheezes or crackles #7.5 ETT tube placed negative sign of infection, bleeding Cardiovascular: Regular rate and rhythm without murmur gallop or rub normal S1 and S2 Abdomen: negative abdominal pain, nondistended, positive soft, bowel sounds, no rebound, no ascites, no appreciable mass Extremities: No significant cyanosis, clubbing, or edema bilateral lower extremities Skin: Negative rashes, lesions, ulcers Psychiatric: Unable to assess secondary to intubation/sedation Central nervous system: Does not respond to painful stimuli     Data Reviewed: Care during the described time interval was provided by me .  I have reviewed this patient's available data, including medical history, events of note, physical examination, and all test results as part of my  evaluation.   CBC: Recent Labs  Lab 04/13/19 0435  04/14/19 0520 04/15/19 0401 04/15/19 0500 04/16/19 0505 04/17/19 0445  WBC 3.8*  --  4.9  --  3.7* 4.3 5.5  NEUTROABS 2.9  --  3.6  --  2.3 2.6 3.6  HGB 12.9*   < > 13.8 12.6* 12.8* 12.7* 13.5  HCT 39.2   < > 41.6 37.0* 40.0 40.0 42.2  MCV 90.7  --  90.8  --  91.7 92.2 93.0  PLT 104*  --  120*  --  112* 111* 104*   < > = values in this interval not displayed.   Basic Metabolic Panel: Recent Labs  Lab 04/12/19 0708  04/12/19 1610 04/13/19 0435 04/13/19 1625  04/14/19 0520 04/15/19 0401 04/15/19 0500 04/16/19 0505 04/17/19 0445  NA  --    < >  --  135  --    < > 143 143 142 145 149*  K  --    < >  --  4.0  --    < > 4.0 4.5 4.6 4.2 4.8  CL  --   --   --  104  --   --  112*  --  114* 118* 119*  CO2  --   --   --  22  --   --  23  --  20* 23 23  GLUCOSE  --   --   --  196*  --   --  128*  --  360* 367* 435*  BUN  --   --   --  44*  --   --  48*  --  63* 63* 72*  CREATININE 1.44*  --   --  1.74*  --   --  1.54*  --  1.77* 1.69* 1.69*  CALCIUM  --   --   --  7.2*  --   --  7.4*  --  7.3* 7.1* 7.3*  MG 1.4*  --  1.6* 1.9 2.0  --  2.2  --   --   --  2.8*  PHOS 4.1  --  4.2 4.0 3.5  --  3.2  --   --   --   --    < > = values in this interval not displayed.   GFR: Estimated Creatinine Clearance: 55 mL/min (A) (by C-G formula based on SCr of 1.69 mg/dL (H)). Liver Function Tests: Recent Labs  Lab 04/13/19 0435 04/14/19 0520 04/15/19 0500 04/16/19 0505 04/17/19 0445  AST 63* 104* 74* 73* 82*  ALT 48* 87* 82* 87* 103*  ALKPHOS 60 65 60 56 56  BILITOT 0.5 0.4 0.3 0.3 0.6  PROT 5.7* 6.0* 5.8* 5.7* 5.7*  ALBUMIN 2.3* 2.4* 2.3* 2.2* 2.3*   No results for input(s): LIPASE, AMYLASE in the last 168 hours. No results for input(s): AMMONIA in the last 168 hours. Coagulation Profile: Recent Labs  Lab 04/11/19 2125  INR 1.1   Cardiac Enzymes: No results for input(s): CKTOTAL, CKMB, CKMBINDEX, TROPONINI in the last 168  hours. BNP (last 3 results) No results for input(s): PROBNP in the last 8760 hours. HbA1C: No results for input(s): HGBA1C in the last 72 hours. CBG: Recent Labs  Lab 04/16/19 1544 04/16/19 1939 04/16/19 2317 04/17/19 0328 04/17/19 0733  GLUCAP 426* 336* 332* 359* 372*   Lipid Profile: Recent Labs    04/15/19 0500  TRIG 212*   Thyroid Function Tests: No results for input(s): TSH, T4TOTAL, FREET4, T3FREE, THYROIDAB in the last 72 hours. Anemia Panel: No results for input(s): VITAMINB12, FOLATE, FERRITIN, TIBC, IRON, RETICCTPCT in the last 72 hours. Urine analysis:    Component Value Date/Time   COLORURINE YELLOW 04/11/2019 2125   APPEARANCEUR CLEAR 04/11/2019 2125   LABSPEC 1.025 04/11/2019 2125   PHURINE 5.0 04/11/2019 2125   GLUCOSEU 50 (A) 04/11/2019 2125   HGBUR SMALL (A) 04/11/2019 2125   BILIRUBINUR NEGATIVE 04/11/2019 2125   KETONESUR 5 (A) 04/11/2019 2125   PROTEINUR >=300 (A) 04/11/2019 2125   UROBILINOGEN 0.2 12/22/2011 2130   NITRITE NEGATIVE 04/11/2019 2125   LEUKOCYTESUR NEGATIVE 04/11/2019 2125   Sepsis Labs: '@LABRCNTIP'$ (procalcitonin:4,lacticidven:4)  ) Recent Results (from the past 240 hour(s))  Novel Coronavirus, NAA ([facility])     Status: Abnormal   Collection Time: 04/11/19  8:21 AM   Specimen: Nasal Swab  Result Value Ref Range Status   SARS-CoV-2, NAA Detected (A) Not Detected Final    Comment: This test was developed and its performance characteristics determined by [employer]. This test has not been FDA cleared or approved. This test has been authorized  by FDA under an Emergency Use Authorization (EUA). This test is only authorized for the duration of time the declaration that circumstances exist justifying the authorization of the emergency use of in vitro diagnostic tests for detection of SARS-CoV-2 virus and/or diagnosis of COVID-19 infection under section 564(b)(1) of the Act, 21 U.S.C. 846NGE-9(B)(2), unless the  authorization is terminated or revoked sooner. When diagnostic testing is negative, the possibility of a false negative result should be considered in the context of a patient's recent exposures and the presence of clinical signs and symptoms consistent with COVID-19. An individual without symptoms of COVID-19 and who is not shedding SARS-CoV-2 virus would expect to have a negative (not detected) result in this assay.   Blood Culture (routine x 2)     Status: None   Collection Time: 04/11/19  9:25 PM   Specimen: BLOOD  Result Value Ref Range Status   Specimen Description BLOOD  Final   Special Requests NONE  Final   Culture   Final    NO GROWTH 5 DAYS Performed at [facility], [address] [place], [place] [address]    Report Status 04/16/2019 FINAL  Final  Urine culture     Status: None   Collection Time: 04/11/19  9:25 PM   Specimen: In/Out Cath Urine  Result Value Ref Range Status   Specimen Description   Final    IN/OUT CATH URINE Performed at [facility], [address], [place], [place] [address]    Special Requests   Final    NONE Performed at [facility], [address] [place], [place] [address]    Culture   Final    NO GROWTH Performed at [facility], [place] [address], [place], [place] [address]    Report Status 04/13/2019 FINAL  Final  Blood Culture (routine x 2)     Status: None   Collection Time: 04/11/19  9:45 PM   Specimen: BLOOD  Result Value Ref Range Status   Specimen Description BLOOD  Final   Special Requests NONE  Final   Culture   Final    NO GROWTH 5 DAYS Performed at [facility], [address], [place], [place] [address]    Report Status 04/16/2019 FINAL  Final  SARS Coronavirus 2 [facility] order, Performed in [facility] hospital lab) Nasopharyngeal Nasopharyngeal Swab     Status: Abnormal   Collection Time: 04/11/19  9:50 PM   Specimen: Nasopharyngeal Swab  Result Value Ref Range Status   SARS Coronavirus 2 POSITIVE (A) NEGATIVE  Final    Comment: RESULT CALLED TO, READ BACK BY AND VERIFIED WITH: TURNER,C. AT 2356 ON 04/11/2019 (NOTE) If result is NEGATIVE SARS-CoV-2 target nucleic acids are NOT DETECTED. The SARS-CoV-2 RNA is generally detectable in upper and lower  respiratory specimens during the acute phase of infection. The lowest  concentration of SARS-CoV-2 viral copies this assay can detect is 250  copies / mL. A negative result does not preclude SARS-CoV-2 infection  and should not be used as the sole basis for treatment or other  patient management decisions.  A negative result may occur with  improper specimen collection / handling, submission of specimen other  than nasopharyngeal swab, presence of viral mutation(s) within the  areas targeted by this assay, and inadequate number of viral copies  (<250 copies / mL). A negative result must be combined with clinical  observations, patient history, and epidemiological information. If result is POSITIVE SARS-CoV-2 target nucleic acids are DETECTED. The  S ARS-CoV-2 RNA is generally detectable in upper and lower  respiratory specimens during the acute phase of infection.  Positive  results are indicative of active infection with SARS-CoV-2.  Clinical  correlation with patient history and other diagnostic information is  necessary to determine patient infection status.  Positive results do  not rule out bacterial infection or co-infection with other viruses. If result is PRESUMPTIVE POSTIVE SARS-CoV-2 nucleic acids MAY BE PRESENT.   A presumptive positive result was obtained on the submitted specimen  and confirmed on repeat testing.  While 2019 novel coronavirus  (SARS-CoV-2) nucleic acids may be present in the submitted sample  additional confirmatory testing may be necessary for epidemiological  and / or clinical management purposes  to differentiate between  SARS-CoV-2 and other Sarbecovirus currently known to infect humans.  If clinically indicated  additional testing with an alternate test  methodology [phone]) is adv ised. The SARS-CoV-2 RNA is generally  detectable in upper and lower respiratory specimens during the acute  phase of infection. The expected result is Negative. Fact Sheet for Patients:  StrictlyIdeas.no Fact Sheet for Healthcare Providers: BankingDealers.co.za This test is not yet approved or cleared by the [place] FDA and has been authorized for detection and/or diagnosis of SARS-CoV-2 by FDA under an Emergency Use Authorization (EUA).  This EUA will remain in effect (meaning this test can be used) for the duration of the COVID-19 declaration under Section 564(b)(1) of the Act, 21 U.S.C. section 360bbb-3(b)(1), unless the authorization is terminated or revoked sooner. Performed at [facility], [address], [place], [place] [address]   MRSA PCR Screening     Status: None   Collection Time: 04/12/19  6:56 AM   Specimen: Nasal Mucosa; Nasopharyngeal  Result Value Ref Range Status   MRSA by PCR NEGATIVE NEGATIVE Final    Comment:        The GeneXpert MRSA Assay (FDA approved for NASAL specimens only), is one component of a comprehensive MRSA colonization surveillance program. It is not intended to diagnose MRSA infection nor to guide or monitor treatment for MRSA infections. Performed at [facility], [place] [address], [place], [place] [address]   Expectorated sputum assessment w rflx to resp cult     Status: None   Collection Time: 04/12/19  3:56 PM   Specimen: Tracheal Aspirate; Sputum  Result Value Ref Range Status   Specimen Description TRACHEAL ASPIRATE  Final   Special Requests NONE  Final   Sputum evaluation   Final    THIS SPECIMEN IS ACCEPTABLE FOR SPUTUM CULTURE Performed at [facility], [place] [address] [place], [place] [address]    Report Status 04/12/2019 FINAL  Final  Culture, respiratory      Status: None   Collection Time: 04/12/19  3:56 PM   Specimen: Tracheal Aspirate  Result Value Ref Range Status   Specimen Description   Final    TRACHEAL ASPIRATE Performed at [place] [address], [place], [place] [address]    Special Requests   Final    NONE Reflexed from [id number] Performed at [facility], [place] [address] [place], [place] [address]    Gram Stain   Final    MODERATE WBC PRESENT, PREDOMINANTLY MONONUCLEAR RARE SQUAMOUS EPITHELIAL CELLS PRESENT RARE GRAM POSITIVE COCCI IN CLUSTERS RARE BUDDING YEAST SEEN    Culture   Final    RARE Consistent with normal respiratory flora. Performed at [facility], [place]  [address], [place], [place] [address]    Report Status 04/15/2019 FINAL  Final         Radiology Studies: No results found.      Scheduled Meds: . arformoterol  15 mcg Nebulization BID  . aspirin  81 mg Oral Daily  . budesonide (PULMICORT) nebulizer solution  0.5 mg Nebulization BID  . chlorhexidine gluconate (MEDLINE KIT)  15 mL Mouth Rinse BID  . Chlorhexidine Gluconate Cloth  6 each Topical Daily  . clonazePAM  2 mg Per Tube BID  . clopidogrel  75 mg Per Tube Daily  . dexamethasone (DECADRON) injection  6 mg Intravenous Q24H  . feeding supplement (PRO-STAT SUGAR FREE 64)  30 mL Per Tube Daily  . folic acid  1 mg Per Tube Daily  . HYDROmorphone  2 mg Oral Q6H  . insulin aspart  0-20 Units Subcutaneous Q4H  . insulin aspart  5 Units Subcutaneous Q6H  . insulin glargine  30 Units Subcutaneous BID  . ipratropium  0.5 mg Nebulization Q6H  . levalbuterol  1.25 mg Nebulization Q6H WA  . mouth rinse  15 mL Mouth Rinse 10 times per day  . metoprolol tartrate  25 mg Per Tube BID  . multivitamin  15 mL Per Tube Daily  . pantoprazole sodium  40 mg Per Tube Daily   Continuous Infusions: . dexmedetomidine (PRECEDEX) IV infusion 0.6 mcg/kg/hr (04/17/19 0700)  . feeding supplement (VITAL AF 1.2 CAL) 1,000  mL (04/17/19 0147)  . heparin 1,050 Units/hr (04/17/19 0700)  . phenylephrine (NEO-SYNEPHRINE) Adult infusion Stopped (04/13/19 0503)     LOS: 5 days   The patient is critically ill with multiple organ systems failure and requires high complexity decision making for assessment and support, frequent evaluation and titration of therapies, application of advanced monitoring technologies and extensive interpretation of multiple databases. Critical Care Time devoted to patient care services described in this note  Time spent: 40 minutes    Ersie Savino, Geraldo Docker, MD [facility] Pager [phone]  If 7PM-7AM, please contact night-coverage [email or web address] Password [email or web address] 04/17/2019, 7:58 AM

## 2019-04-18 DIAGNOSIS — L8990 Pressure ulcer of unspecified site, unspecified stage: Secondary | ICD-10-CM | POA: Insufficient documentation

## 2019-04-18 DIAGNOSIS — E1165 Type 2 diabetes mellitus with hyperglycemia: Secondary | ICD-10-CM | POA: Diagnosis present

## 2019-04-18 DIAGNOSIS — U071 COVID-19: Secondary | ICD-10-CM | POA: Diagnosis present

## 2019-04-18 DIAGNOSIS — J439 Emphysema, unspecified: Secondary | ICD-10-CM

## 2019-04-18 DIAGNOSIS — IMO0002 Reserved for concepts with insufficient information to code with codable children: Secondary | ICD-10-CM | POA: Diagnosis present

## 2019-04-18 DIAGNOSIS — J1282 Pneumonia due to coronavirus disease 2019: Secondary | ICD-10-CM | POA: Diagnosis present

## 2019-04-18 DIAGNOSIS — N179 Acute kidney failure, unspecified: Secondary | ICD-10-CM | POA: Diagnosis present

## 2019-04-18 DIAGNOSIS — R5081 Fever presenting with conditions classified elsewhere: Secondary | ICD-10-CM

## 2019-04-18 DIAGNOSIS — J441 Chronic obstructive pulmonary disease with (acute) exacerbation: Secondary | ICD-10-CM

## 2019-04-18 DIAGNOSIS — E7800 Pure hypercholesterolemia, unspecified: Secondary | ICD-10-CM

## 2019-04-18 LAB — GLUCOSE, CAPILLARY
Glucose-Capillary: 315 mg/dL — ABNORMAL HIGH (ref 70–99)
Glucose-Capillary: 304 mg/dL — ABNORMAL HIGH (ref 70–99)
Glucose-Capillary: 350 mg/dL — ABNORMAL HIGH (ref 70–99)
Glucose-Capillary: 396 mg/dL — ABNORMAL HIGH (ref 70–99)
Glucose-Capillary: 458 mg/dL — ABNORMAL HIGH (ref 70–99)
Glucose-Capillary: 415 mg/dL — ABNORMAL HIGH (ref 70–99)
Glucose-Capillary: 416 mg/dL — ABNORMAL HIGH (ref 70–99)
Glucose-Capillary: 469 mg/dL — ABNORMAL HIGH (ref 70–99)

## 2019-04-18 LAB — CBC WITH DIFFERENTIAL/PLATELET
Abs Immature Granulocytes: 0.06 10*3/uL (ref 0.00–0.07)
Basophils Absolute: 0 10*3/uL (ref 0.0–0.1)
Basophils Relative: 0 %
Eosinophils Absolute: 0 10*3/uL (ref 0.0–0.5)
Eosinophils Relative: 0 %
HCT: 44.9 % (ref 39.0–52.0)
Hemoglobin: 14.1 g/dL (ref 13.0–17.0)
Immature Granulocytes: 1 %
Lymphocytes Relative: 11 %
Lymphs Abs: 1 10*3/uL (ref 0.7–4.0)
MCH: 29.6 pg (ref 26.0–34.0)
MCHC: 31.4 g/dL (ref 30.0–36.0)
MCV: 94.1 fL (ref 80.0–100.0)
Monocytes Absolute: 0.5 10*3/uL (ref 0.1–1.0)
Monocytes Relative: 6 %
Neutro Abs: 7.3 10*3/uL (ref 1.7–7.7)
Neutrophils Relative %: 82 %
Platelets: 184 10*3/uL (ref 150–400)
RBC: 4.77 MIL/uL (ref 4.22–5.81)
RDW: 17 % — ABNORMAL HIGH (ref 11.5–15.5)
WBC: 8.9 10*3/uL (ref 4.0–10.5)
nRBC: 0 % (ref 0.0–0.2)

## 2019-04-18 LAB — BASIC METABOLIC PANEL
Anion gap: 7 (ref 5–15)
Anion gap: 29 — ABNORMAL HIGH (ref 5–15)
BUN: 70 mg/dL — ABNORMAL HIGH (ref 8–23)
BUN: 88 mg/dL — ABNORMAL HIGH (ref 8–23)
CO2: 27 mmol/L (ref 22–32)
CO2: 23 mmol/L (ref 22–32)
Calcium: 8.2 mg/dL — ABNORMAL LOW (ref 8.9–10.3)
Calcium: 7.3 mg/dL — ABNORMAL LOW (ref 8.9–10.3)
Chloride: 119 mmol/L — ABNORMAL HIGH (ref 98–111)
Chloride: 111 mmol/L (ref 98–111)
Creatinine, Ser: 1.49 mg/dL — ABNORMAL HIGH (ref 0.61–1.24)
Creatinine, Ser: 1.26 mg/dL — ABNORMAL HIGH (ref 0.61–1.24)
GFR calc Af Amer: 56 mL/min — ABNORMAL LOW (ref >60)
GFR calc Af Amer: >60 mL/min (ref >60)
GFR calc non Af Amer: 49 mL/min — ABNORMAL LOW (ref >60)
GFR calc non Af Amer: 59 mL/min — ABNORMAL LOW (ref >60)
Glucose, Bld: 325 mg/dL — ABNORMAL HIGH (ref 70–99)
Glucose, Bld: 483 mg/dL — ABNORMAL HIGH (ref 70–99)
Potassium: 4.8 mmol/L (ref 3.5–5.1)
Potassium: 5.7 mmol/L — ABNORMAL HIGH (ref 3.5–5.1)
Sodium: 153 mmol/L — ABNORMAL HIGH (ref 135–145)
Sodium: 163 mmol/L (ref 135–145)

## 2019-04-18 LAB — HEPARIN LEVEL (UNFRACTIONATED): Heparin Unfractionated: 0.44 IU/mL (ref 0.30–0.70)

## 2019-04-18 LAB — POCT I-STAT 7, (LYTES, BLD GAS, ICA,H+H)
Acid-Base Excess: 1 mmol/L (ref 0.0–2.0)
Bicarbonate: 25.7 mmol/L (ref 20.0–28.0)
Calcium, Ion: 1.26 mmol/L (ref 1.15–1.40)
HCT: 41 % (ref 39.0–52.0)
Hemoglobin: 13.9 g/dL (ref 13.0–17.0)
O2 Saturation: 93 %
Patient temperature: 37.9
Potassium: 4.9 mmol/L (ref 3.5–5.1)
Sodium: 154 mmol/L — ABNORMAL HIGH (ref 135–145)
TCO2: 27 mmol/L (ref 22–32)
pCO2 arterial: 41.1 mmHg (ref 32.0–48.0)
pH, Arterial: 7.408 (ref 7.350–7.450)
pO2, Arterial: 70 mmHg — ABNORMAL LOW (ref 83.0–108.0)

## 2019-04-18 LAB — TROPONIN I (HIGH SENSITIVITY)
Troponin I (High Sensitivity): 111 ng/L (ref <18)
Troponin I (High Sensitivity): 118 ng/L (ref <18)

## 2019-04-18 LAB — HEPATIC FUNCTION PANEL
ALT: 93 U/L — ABNORMAL HIGH (ref 0–44)
AST: 44 U/L — ABNORMAL HIGH (ref 15–41)
Albumin: 2.5 g/dL — ABNORMAL LOW (ref 3.5–5.0)
Alkaline Phosphatase: 59 U/L (ref 38–126)
Bilirubin, Direct: 0.1 mg/dL (ref 0.0–0.2)
Indirect Bilirubin: 0.4 mg/dL (ref 0.3–0.9)
Total Bilirubin: 0.5 mg/dL (ref 0.3–1.2)
Total Protein: 6.2 g/dL — ABNORMAL LOW (ref 6.5–8.1)

## 2019-04-18 LAB — MAGNESIUM: Magnesium: 2.8 mg/dL — ABNORMAL HIGH (ref 1.7–2.4)

## 2019-04-18 LAB — C-REACTIVE PROTEIN: CRP: 1.2 mg/dL — ABNORMAL HIGH (ref <1.0)

## 2019-04-18 LAB — D-DIMER, QUANTITATIVE: D-Dimer, Quant: 3.15 ug/mL-FEU — ABNORMAL HIGH (ref 0.00–0.50)

## 2019-04-18 LAB — TSH: TSH: 0.927 u[IU]/mL (ref 0.350–4.500)

## 2019-04-18 MED ORDER — IPRATROPIUM-ALBUTEROL 0.5-2.5 (3) MG/3ML IN SOLN
3.0000 mL | Freq: Four times a day (QID) | RESPIRATORY_TRACT | Status: DC
  Administered 2019-04-18 – 2019-04-24 (×26): 3 mL via RESPIRATORY_TRACT
  Filled 2019-04-18 (×27): qty 3

## 2019-04-18 MED ORDER — DEXTROSE-NACL 5-0.45 % IV SOLN: INTRAVENOUS | Status: DC

## 2019-04-18 MED ORDER — INSULIN REGULAR(HUMAN) IN NACL 100-0.9 UT/100ML-% IV SOLN
INTRAVENOUS | Status: DC
  Administered 2019-04-18: 3.6 [IU]/h via INTRAVENOUS
  Administered 2019-04-19: 12:00:00 19.4 [IU]/h via INTRAVENOUS
  Administered 2019-04-19: 06:00:00 15.8 [IU]/h via INTRAVENOUS
  Filled 2019-04-18: qty 100

## 2019-04-18 MED ORDER — AZITHROMYCIN 250 MG PO TABS
250.0000 mg | ORAL_TABLET | Freq: Every day | ORAL | Status: DC
  Administered 2019-04-19 – 2019-04-20 (×2): 250 mg
  Filled 2019-04-18 (×3): qty 1

## 2019-04-18 MED ORDER — INSULIN GLARGINE 100 UNIT/ML ~~LOC~~ SOLN
10.0000 [IU] | Freq: Once | SUBCUTANEOUS | Status: AC
  Administered 2019-04-18: 10 [IU] via SUBCUTANEOUS
  Filled 2019-04-18: qty 0.1

## 2019-04-18 MED ORDER — DEXTROSE 50 % IV SOLN: 25.0000 mL | INTRAVENOUS | Status: DC | PRN

## 2019-04-18 MED ORDER — INSULIN ASPART 100 UNIT/ML ~~LOC~~ SOLN
5.0000 [IU] | Freq: Once | SUBCUTANEOUS | Status: AC
  Administered 2019-04-18: 5 [IU] via SUBCUTANEOUS

## 2019-04-18 MED ORDER — INSULIN REGULAR BOLUS VIA INFUSION
0.0000 [IU] | Freq: Three times a day (TID) | INTRAVENOUS | Status: DC
  Filled 2019-04-18: qty 10

## 2019-04-18 MED ORDER — DEXTROSE 5 % IV SOLN
INTRAVENOUS | Status: DC
  Administered 2019-04-18: 23:00:00 via INTRAVENOUS

## 2019-04-18 MED ORDER — SODIUM CHLORIDE 0.9 % IV SOLN
INTRAVENOUS | Status: DC
  Administered 2019-04-18: 22:00:00 via INTRAVENOUS

## 2019-04-18 MED ORDER — AZITHROMYCIN 500 MG PO TABS
500.0000 mg | ORAL_TABLET | Freq: Every day | ORAL | Status: AC
  Administered 2019-04-18: 500 mg
  Filled 2019-04-18: qty 1

## 2019-04-18 MED ORDER — FREE WATER
250.0000 mL | Status: DC
  Administered 2019-04-18: 250 mL

## 2019-04-18 MED ORDER — INSULIN GLARGINE 100 UNIT/ML ~~LOC~~ SOLN
50.0000 [IU] | Freq: Two times a day (BID) | SUBCUTANEOUS | Status: DC
  Filled 2019-04-18: qty 0.5

## 2019-04-18 MED ORDER — FREE WATER
300.0000 mL | Status: DC
  Administered 2019-04-19 (×2): 300 mL

## 2019-04-18 MED ORDER — SODIUM CHLORIDE 0.45 % IV SOLN
INTRAVENOUS | Status: DC
  Administered 2019-04-18 – 2019-04-19 (×3): via INTRAVENOUS

## 2019-04-18 MED ORDER — INSULIN ASPART 100 UNIT/ML ~~LOC~~ SOLN
15.0000 [IU] | Freq: Four times a day (QID) | SUBCUTANEOUS | Status: DC
  Administered 2019-04-18: 15 [IU] via SUBCUTANEOUS

## 2019-04-18 NOTE — Progress Notes (Signed)
8 second run of v-tach observed on monitor, pt has had frequent PVCs all night. EKG obtained via [place] monitor. MD notified, see orders

## 2019-04-18 NOTE — Progress Notes (Addendum)
BMP reveals sodium 163, which given a BGL of 483 means a corrected sodium of 172!  Starting D5W at 100cc/hr, and FWF at 250cc Q4H, will give PCCM a heads up call as well to see if they have any further recs.  Anion gap 29.  BMP switched to Q4H.  Ordering lactate and BHB labs.  K 5.7, expect this will come down between the D5W and the insulin gtt.  Spoke with Dr. Jimmy Footman, she recd: dont do D5W, instead do half NS at 75 + FWF at 300 Q4H.  So ill go ahead and order this and let the RN know.

## 2019-04-18 NOTE — Progress Notes (Signed)
Called pt's daughter to update of pt condition. Pt's daughter appreciative of update

## 2019-04-18 NOTE — Progress Notes (Signed)
Tachycardic and tachypneic when precedex turned off, no change in level of responsiveness. PRN Fentanyl administered, Precedex restarted

## 2019-04-18 NOTE — Progress Notes (Addendum)
Patient BGL remains out of control, (hadnt started the lantus yet), though despite 15 QID novolog and 20u novolog SSI a few hours ago, still above 400.  Will just start glucostabilizer insulin gtt for now.  With IVF rates at only 10cc/hr for KVO at the moment (looks like they dont have him on any cont IVF, and were even doing lasix yesterday).  Will order BMP now just to make sure we dont have any signs of DKA.  Repeat K at 0100 and the usual daily CMP at 0500

## 2019-04-18 NOTE — Procedures (Signed)
Cortrak  Person Inserting Tube:  Maylon Peppers C, RD Tube Type:  Cortrak - 43 inches Tube Location:  Left nare Initial Placement:  Postpyloric Secured by: Bridle Technique Used to Measure Tube Placement:  Documented cm marking at nare/ corner of mouth Cortrak Secured At:  90 cm    Cortrak Tube Team Note:  Consult received to place a Cortrak feeding tube.   No x-ray is required. RN may begin using tube.   If the tube becomes dislodged please keep the tube and contact the Cortrak team at [email or web address] (password [email or web address]) for replacement.  If after hours and replacement cannot be delayed, place a NG tube and confirm placement with an abdominal x-ray.    [place], [place], [place] Pager [phone] After Hours Pager

## 2019-04-18 NOTE — Progress Notes (Addendum)
Patient with 8 beat run of V.Tach:  Had been running 10-12 PVCs/min all night.  1) check BMP 2) ABG 3) Mg 4) troponin 5) 12 lead EKG 6) TSH  Update: EKG reviewed, no STEMI on EKG, does have an S1Q3 with flat T waves in 3, that wasn't apparent a couple of days ago; however, patient is already on full dose heparin, and this is non-specific anyhow.

## 2019-04-18 NOTE — Progress Notes (Signed)
PROGRESS NOTE    Robert [name]  XIP:[id number] DOB: Jul 22, 1951 DOA: 04/11/2019 PCP: Redmond School, MD   Brief Narrative:  68 year old WM PMHx COPD, coronary artery disease, diabetes, ischemic cardiomyopathy, rheumatoid arthritis,   Presents to the hospital with complaints of progressive worsening shortness of breath.  He was mildly febrile and had worsening shortness of breath so he called EMS.  In the ER, he was noted to be substantially hypoxic, had mental status changes.  He tested positive for COVID-19.  He was intubated in the emergency room for persistent hypoxia.  He has been transferred to [facility] for further management.   Subjective: 8/7 A/O x0, sedated/intubated.  Per RN does bite down when mouth care provided.   Assessment & Plan:   Principal Problem:   Pneumonia due to COVID-19 virus Active Problems:   Rheumatoid arthritis ([place])   Hyperlipidemia   COPD (chronic obstructive pulmonary disease) (HCC)   Controlled type 2 diabetes mellitus with diabetic peripheral angiopathy without gangrene, with long-term current use of insulin (HCC)   Essential hypertension   Ischemic cardiomyopathy   Coronary artery disease   Dyslipidemia (high LDL; low HDL)   COVID-19 virus detected   Acute respiratory distress syndrome (ARDS) due to COVID-19 virus   Atrial fibrillation with RVR (HCC)   COPD (chronic obstructive pulmonary disease) with emphysema (HCC)   AKI (acute kidney injury) ([place])   Diabetes mellitus type 2, uncontrolled, with complications (HCC)  Acute respiratory failure with hypoxia/COVID pneumonia - 8/1 Remdesivir, Actemra, Decadron - Extubated 8/2--> on 8/3 required reintubation - Reintubation was thought secondary to a bacterial superinfection overlaid on his COVID pneumonia -Appropriate antibiotics started see below -Titrate sedating medication down; - 8/6 we will give patient 3-day burst of high-dose steroids.  Solu-Medrol 40 mg BID - Increased  secretions, overnight T-max 38.5 C.  Possible superinfection brewing. - Tracheal aspirate pending - Start Z-Pak  COPD - See acute respiratory failure  A. fib with RVR - Currently NSR -CHADSVASc score of 5, currently on heparin - 8/5 Lasix 40 mg x 1 -Metoprolol 25 mg twice daily - Strict in and out +2.9 L -Daily weight Filed Weights   04/13/19 0500 04/15/19 0230 04/17/19 0118  Weight: 109 kg 106.1 kg 106.6 kg  - Daily CVP  8/7 CVP =??  Ischemic cardiomyopathy - See A. Fib -Patient had episode of hypotension and was placed on phenyl ephedrine has now been weaned off. - See A. fib RVR  Essential HTN -See A. fib RVR  CAD/stents -Aspirin + Plavix  AKI (baseline Cr~0.8) -Mostly multifactorial to include infection, hypotension/hypoperfusion Recent Labs  Lab 04/14/19 0520 04/15/19 0500 04/16/19 0505 04/17/19 0445 04/18/19 0420  CREATININE 1.54* 1.77* 1.69* 1.69* 1.49*  -Slowly improving   Rheumatoid arthritis -Chronically on methotrexate and Actemra  Diabetes type 2 uncontrolled -7/3 1 hemoglobin A1c= 7.4 - 8/7 increase Lantus 50 units BID  - 8/7 increase NovoLog 15 units QID  - Resistant SSI     DVT prophylaxis: Heparin Code Status: Full Family Communication:  Disposition Plan: TBD   Consultants:  PCCM  Procedures/Significant Events:     I have personally reviewed and interpreted all radiology studies and my findings are as above.  VENTILATOR SETTINGS: Ventilator mode: PSV, CPAP FiO2, 30% PS: 12 PEEP: 5   Cultures   Antimicrobials: Anti-infectives (From admission, onward)   Start     Stop   04/19/19 1000  azithromycin (ZITHROMAX) tablet 250 mg     04/23/19 0959   04/18/19  1300  azithromycin (ZITHROMAX) tablet 500 mg     04/18/19 1351   04/13/19 1000  remdesivir 100 mg in sodium chloride 0.9 % 250 mL IVPB     04/16/19 1103   04/12/19 1000  remdesivir 200 mg in sodium chloride 0.9 % 250 mL IVPB     04/12/19 1317   04/12/19 0600   ceFEPIme (MAXIPIME) 2 g in sodium chloride 0.9 % 100 mL IVPB  Status:  Discontinued     04/15/19 1452   04/12/19 0200  vancomycin (VANCOCIN) IVPB 750 mg/150 ml premix  Status:  Discontinued     04/12/19 1455   04/11/19 2130  ceFEPIme (MAXIPIME) 2 g in sodium chloride 0.9 % 100 mL IVPB     04/11/19 2257   04/11/19 2130  metroNIDAZOLE (FLAGYL) IVPB 500 mg     04/11/19 2356   04/11/19 2130  vancomycin (VANCOCIN) IVPB 1000 mg/200 mL premix     04/12/19 0105        Devices    LINES / TUBES:  #7.5 cuffed ETT 8/3>>    Continuous Infusions: . dexmedetomidine (PRECEDEX) IV infusion 0.6 mcg/kg/hr (04/18/19 1400)  . feeding supplement (VITAL AF 1.2 CAL) 1,000 mL (04/18/19 0836)  . heparin 1,050 Units/hr (04/18/19 1400)  . phenylephrine (NEO-SYNEPHRINE) Adult infusion Stopped (04/13/19 0503)     Objective: Vitals:   04/18/19 1200 04/18/19 1222 04/18/19 1300 04/18/19 1400  BP: (!) 100/58 (!) 100/58 (!) 79/52 125/67  Pulse: (!) 101 (!) 106 92 (!) 102  Resp: (!) 29 (!) 30 (!) 29 (!) 31  Temp: (!) 101.7 F (38.7 C) (!) 101.8 F (38.8 C) (!) 101.7 F (38.7 C) (!) 102.2 F (39 C)  TempSrc:      SpO2: 95% 95% 96% 92%  Weight:      Height:        Intake/Output Summary (Last 24 hours) at 04/18/2019 1507 Last data filed at 04/18/2019 1500 Gross per 24 hour  Intake 3103.1 ml  Output 5170 ml  Net -2066.9 ml   Filed Weights   04/13/19 0500 04/15/19 0230 04/17/19 0118  Weight: 109 kg 106.1 kg 106.6 kg    Physical Exam:  General: A/O x0 (sedated/intubated), positive acute respiratory distress Eyes: negative scleral hemorrhage, negative anisocoria, negative icterus ENT: Negative Runny nose, negative gingival bleeding, #7.5 ETT tube placed negative sign of infection, bleeding Neck:  Negative scars, masses, torticollis, lymphadenopathy, JVD Lungs: Tachypneic, clear to auscultation bilaterally without wheezes or crackles Cardiovascular: Tachycardic without murmur gallop or rub normal  S1 and S2 Abdomen: negative abdominal pain, nondistended, positive soft, bowel sounds, no rebound, no ascites, no appreciable mass Extremities: No significant cyanosis, clubbing, or edema bilateral lower extremities Skin: Negative rashes, lesions, ulcers Psychiatric: Unable to assess secondary to patient's intubation/sedation   Central nervous system: Does not respond to painful stimuli     Data Reviewed: Care during the described time interval was provided by me .  I have reviewed this patient's available data, including medical history, events of note, physical examination, and all test results as part of my evaluation.   CBC: Recent Labs  Lab 04/14/19 0520  04/15/19 0500 04/16/19 0505 04/17/19 0445 04/18/19 0444 04/18/19 1147  WBC 4.9  --  3.7* 4.3 5.5  --  8.9  NEUTROABS 3.6  --  2.3 2.6 3.6  --  7.3  HGB 13.8   < > 12.8* 12.7* 13.5 13.9 14.1  HCT 41.6   < > 40.0 40.0 42.2 41.0 44.9  MCV  90.8  --  91.7 92.2 93.0  --  94.1  PLT 120*  --  112* 111* 104*  --  184   < > = values in this interval not displayed.   Basic Metabolic Panel: Recent Labs  Lab 04/12/19 0708  04/12/19 1610 04/13/19 0435 04/13/19 1625  04/14/19 0520  04/15/19 0500 04/16/19 0505 04/17/19 0445 04/18/19 0420 04/18/19 0426 04/18/19 0444  NA  --    < >  --  135  --    < > 143   < > 142 145 149* 153*  --  154*  K  --    < >  --  4.0  --    < > 4.0   < > 4.6 4.2 4.8 4.8  --  4.9  CL  --   --   --  104  --   --  112*  --  114* 118* 119* 119*  --   --   CO2  --   --   --  22  --   --  23  --  20* _0 --   --   GLUCOSE  --   --   --  196*  --   --  128*  --  360* 367* 435* 325*  --   --   BUN  --   --   --  44*  --   --  48*  --  63* 63* 72* 70*  --   --   CREATININE 1.44*  --   --  1.74*  --   --  1.54*  --  1.77* 1.69* 1.69* 1.49*  --   --   CALCIUM  --   --   --  7.2*  --   --  7.4*  --  7.3* 7.1* 7.3* 8.2*  --   --   MG 1.4*  --  1.6* 1.9 2.0  --  2.2  --   --   --  2.8*  --  2.8*  --   PHOS  4.1  --  4.2 4.0 3.5  --  3.2  --   --   --   --   --   --   --    < > = values in this interval not displayed.   GFR: Estimated Creatinine Clearance: 62.4 mL/min (A) (by C-G formula based on SCr of 1.49 mg/dL (H)). Liver Function Tests: Recent Labs  Lab 04/14/19 0520 04/15/19 0500 04/16/19 0505 04/17/19 0445 04/18/19 0420  AST 104* 74* 73* 82* 44*  ALT 87* 82* 87* 103* 93*  ALKPHOS 65 60 56 56 59  BILITOT 0.4 0.3 0.3 0.6 0.5  PROT 6.0* 5.8* 5.7* 5.7* 6.2*  ALBUMIN 2.4* 2.3* 2.2* 2.3* 2.5*   No results for input(s): LIPASE, AMYLASE in the last 168 hours. No results for input(s): AMMONIA in the last 168 hours. Coagulation Profile: Recent Labs  Lab 04/11/19 2125  INR 1.1   Cardiac Enzymes: No results for input(s): CKTOTAL, CKMB, CKMBINDEX, TROPONINI in the last 168 hours. BNP (last 3 results) No results for input(s): PROBNP in the last 8760 hours. HbA1C: No results for input(s): HGBA1C in the last 72 hours. CBG: Recent Labs  Lab 04/17/19 2347 04/18/19 0317 04/18/19 0558 04/18/19 0754 04/18/19 1232  GLUCAP 316* 315* 304* 350* 396*   Lipid Profile: No results for input(s): CHOL, HDL, LDLCALC, TRIG, CHOLHDL, LDLDIRECT in the last 72 hours. Thyroid Function Tests: Recent  Labs    04/18/19 0420  TSH 0.927   Anemia Panel: No results for input(s): VITAMINB12, FOLATE, FERRITIN, TIBC, IRON, RETICCTPCT in the last 72 hours. Urine analysis:    Component Value Date/Time   COLORURINE YELLOW 04/11/2019 2125   APPEARANCEUR CLEAR 04/11/2019 2125   LABSPEC 1.025 04/11/2019 2125   PHURINE 5.0 04/11/2019 2125   GLUCOSEU 50 (A) 04/11/2019 2125   HGBUR SMALL (A) 04/11/2019 2125   BILIRUBINUR NEGATIVE 04/11/2019 2125   KETONESUR 5 (A) 04/11/2019 2125   PROTEINUR >=300 (A) 04/11/2019 2125   UROBILINOGEN 0.2 12/22/2011 2130   NITRITE NEGATIVE 04/11/2019 2125   LEUKOCYTESUR NEGATIVE 04/11/2019 2125   Sepsis Labs: '@LABRCNTIP'$ (procalcitonin:4,lacticidven:4)  ) Recent  Results (from the past 240 hour(s))  Novel Coronavirus, NAA ([facility])     Status: Abnormal   Collection Time: 04/11/19  8:21 AM   Specimen: Nasal Swab  Result Value Ref Range Status   SARS-CoV-2, NAA Detected (A) Not Detected Final    Comment: This test was developed and its performance characteristics determined by [employer]. This test has not been FDA cleared or approved. This test has been authorized by FDA under an Emergency Use Authorization (EUA). This test is only authorized for the duration of time the declaration that circumstances exist justifying the authorization of the emergency use of in vitro diagnostic tests for detection of SARS-CoV-2 virus and/or diagnosis of COVID-19 infection under section 564(b)(1) of the Act, 21 U.S.C. 962XBM-8(U)(1), unless the authorization is terminated or revoked sooner. When diagnostic testing is negative, the possibility of a false negative result should be considered in the context of a patient's recent exposures and the presence of clinical signs and symptoms consistent with COVID-19. An individual without symptoms of COVID-19 and who is not shedding SARS-CoV-2 virus would expect to have a negative (not detected) result in this assay.   Blood Culture (routine x 2)     Status: None   Collection Time: 04/11/19  9:25 PM   Specimen: BLOOD  Result Value Ref Range Status   Specimen Description BLOOD  Final   Special Requests NONE  Final   Culture   Final    NO GROWTH 5 DAYS Performed at [facility], [address] [place], [place] [address]    Report Status 04/16/2019 FINAL  Final  Urine culture     Status: None   Collection Time: 04/11/19  9:25 PM   Specimen: In/Out Cath Urine  Result Value Ref Range Status   Specimen Description   Final    IN/OUT CATH URINE Performed at [facility], [address] [place], [place] [address]    Special Requests   Final    NONE Performed at [facility], [address], [place],  [place] [address]    Culture   Final    NO GROWTH Performed at [facility], [place] [address] [place], [place] [address]    Report Status 04/13/2019 FINAL  Final  Blood Culture (routine x 2)     Status: None   Collection Time: 04/11/19  9:45 PM   Specimen: BLOOD RIGHT ARM  Result Value Ref Range Status   Specimen Description BLOOD RIGHT ARM  Final   Special Requests   Final    BOTTLES DRAWN AEROBIC AND ANAEROBIC Blood Culture adequate volume   Culture   Final    NO GROWTH 5 DAYS Performed at [facility], [address] [place],  [address]    Report Status 04/16/2019 FINAL  Final  SARS Coronavirus 2 [facility] order, Performed in [facility] hospital lab) Nasopharyngeal Nasopharyngeal Swab     Status: Abnormal   Collection Time: 04/11/19  9:50 PM   Specimen: Nasopharyngeal Swab  Result Value Ref Range Status   SARS Coronavirus 2 POSITIVE (A) NEGATIVE Final    Comment: RESULT CALLED TO, READ BACK BY AND VERIFIED WITH: TURNER,C. AT 2356 ON 04/11/2019 (NOTE) If result is NEGATIVE SARS-CoV-2 target nucleic acids are NOT DETECTED. The SARS-CoV-2 RNA is generally detectable in upper and lower  respiratory specimens during the acute phase of infection. The lowest  concentration of SARS-CoV-2 viral copies this assay can detect is 250  copies / mL. A negative result does not preclude SARS-CoV-2 infection  and should not be used as the sole basis for treatment or other  patient management decisions.  A negative result may occur with  improper specimen collection / handling, submission of specimen other  than nasopharyngeal swab, presence of viral mutation(s) within the  areas targeted by this assay, and inadequate number of viral copies  (<250 copies / mL). A negative result must be combined with clinical  observations, patient history, and epidemiological information. If result is POSITIVE SARS-CoV-2 target nucleic acids are DETECTED. The S ARS-CoV-2 RNA is generally detectable in  upper and lower  respiratory specimens during the acute phase of infection.  Positive  results are indicative of active infection with SARS-CoV-2.  Clinical  correlation with patient history and other diagnostic information is  necessary to determine patient infection status.  Positive results do  not rule out bacterial infection or co-infection with other viruses. If result is PRESUMPTIVE POSTIVE SARS-CoV-2 nucleic acids MAY BE PRESENT.   A presumptive positive result was obtained on the submitted specimen  and confirmed on repeat testing.  While 2019 novel coronavirus  (SARS-CoV-2) nucleic acids may be present in the submitted sample  additional confirmatory testing may be necessary for epidemiological  and / or clinical management purposes  to differentiate between  SARS-CoV-2 and other Sarbecovirus currently known to infect humans.  If clinically indicated additional testing with an alternate test  methodology [phone]) is adv ised. The SARS-CoV-2 RNA is generally  detectable in upper and lower respiratory specimens during the acute  phase of infection. The expected result is Negative. Fact Sheet for Patients:  StrictlyIdeas.no Fact Sheet for Healthcare Providers: BankingDealers.co.za This test is not yet approved or cleared by the [place] FDA and has been authorized for detection and/or diagnosis of SARS-CoV-2 by FDA under an Emergency Use Authorization (EUA).  This EUA will remain in effect (meaning this test can be used) for the duration of the COVID-19 declaration under Section 564(b)(1) of the Act, 21 U.S.C. section 360bbb-3(b)(1), unless the authorization is terminated or revoked sooner. Performed at [facility], [address], [place], [place] [address]   MRSA PCR Screening     Status: None   Collection Time: 04/12/19  6:56 AM   Specimen: Nasal Mucosa; Nasopharyngeal  Result Value Ref Range Status   MRSA by PCR  NEGATIVE NEGATIVE Final    Comment:        The GeneXpert MRSA Assay (FDA approved for NASAL specimens only), is one component of a comprehensive MRSA colonization surveillance program. It is not intended to diagnose MRSA infection nor to guide or monitor treatment for MRSA infections. Performed at [facility], [place] [address], [place], [place] [address]   Expectorated sputum assessment w rflx to resp cult     Status:  None   Collection Time: 04/12/19  3:56 PM   Specimen: Tracheal Aspirate; Sputum  Result Value Ref Range Status   Specimen Description TRACHEAL ASPIRATE  Final   Special Requests NONE  Final   Sputum evaluation   Final    THIS SPECIMEN IS ACCEPTABLE FOR SPUTUM CULTURE Performed at [facility], [place] [address], [place], [place] [address]    Report Status 04/12/2019 FINAL  Final  Culture, respiratory     Status: None   Collection Time: 04/12/19  3:56 PM   Specimen: Tracheal Aspirate  Result Value Ref Range Status   Specimen Description   Final    TRACHEAL ASPIRATE Performed at [place] [address], [place], [place] [address]    Special Requests   Final    NONE Reflexed from [id number] Performed at [facility], [place] [address], [place], [place] [address]    Gram Stain   Final    MODERATE WBC PRESENT, PREDOMINANTLY MONONUCLEAR RARE SQUAMOUS EPITHELIAL CELLS PRESENT RARE GRAM POSITIVE COCCI IN CLUSTERS RARE BUDDING YEAST SEEN    Culture   Final    RARE Consistent with normal respiratory flora. Performed at [facility], [place] [address] [place], [place] [address]    Report Status 04/15/2019 FINAL  Final         Radiology Studies: No results found.      Scheduled Meds: . arformoterol  15 mcg Nebulization BID  . aspirin  81 mg Oral Daily  . [START ON 04/19/2019] azithromycin  250 mg Per Tube Daily  . budesonide (PULMICORT) nebulizer solution  0.5 mg Nebulization BID   . chlorhexidine gluconate (MEDLINE KIT)  15 mL Mouth Rinse BID  . Chlorhexidine Gluconate Cloth  6 each Topical Daily  . clonazePAM  2 mg Per Tube BID  . clopidogrel  75 mg Per Tube Daily  . feeding supplement (PRO-STAT SUGAR FREE 64)  30 mL Per Tube Daily  . folic acid  1 mg Per Tube Daily  . HYDROmorphone  2 mg Oral Q6H  . insulin aspart  0-20 Units Subcutaneous Q4H  . insulin aspart  15 Units Subcutaneous Q6H  . insulin glargine  50 Units Subcutaneous BID  . ipratropium-albuterol  3 mL Nebulization Q6H  . mouth rinse  15 mL Mouth Rinse 10 times per day  . methylPREDNISolone (SOLU-MEDROL) injection  40 mg Intravenous Q12H  . metoprolol tartrate  25 mg Per Tube BID  . multivitamin  15 mL Per Tube Daily  . pantoprazole sodium  40 mg Per Tube Daily   Continuous Infusions: . dexmedetomidine (PRECEDEX) IV infusion 0.6 mcg/kg/hr (04/18/19 1400)  . feeding supplement (VITAL AF 1.2 CAL) 1,000 mL (04/18/19 0836)  . heparin 1,050 Units/hr (04/18/19 1400)  . phenylephrine (NEO-SYNEPHRINE) Adult infusion Stopped (04/13/19 0503)     LOS: 6 days   The patient is critically ill with multiple organ systems failure and requires high complexity decision making for assessment and support, frequent evaluation and titration of therapies, application of advanced monitoring technologies and extensive interpretation of multiple databases. Critical Care Time devoted to patient care services described in this note  Time spent: 40 minutes    Arwa Yero, Geraldo Docker, MD [facility] Pager [phone]  If 7PM-7AM, please contact night-coverage [email or web address] Password [email or web address] 04/18/2019, 3:07 PM

## 2019-04-18 NOTE — Progress Notes (Signed)
ANTICOAGULATION & ANTIBIOTIC CONSULT NOTE - Follow Up Consult  Pharmacy Consult for heparin Indication: atrial fibrillation  Allergies  Allergen Reactions  . Ivp Dye [Iodinated Diagnostic Agents] Itching and Rash    Patient Measurements: Height: 6' (182.9 cm) Weight: 235 lb 0.2 oz (106.6 kg) IBW/kg (Calculated) : 77.6 Heparin Dosing Weight: 98.5 kg  Vital Signs: Temp: 100.4 F (38 C) (08/07 0630) Temp Source: Esophageal (08/07 0400) BP: 113/70 (08/07 0630) Pulse Rate: 130 (08/07 0630)  Labs: Recent Labs    04/16/19 0505  04/17/19 0445 04/17/19 1230 04/18/19 0420 04/18/19 0426 04/18/19 0444  HGB 12.7*  --  13.5  --   --   --  13.9  HCT 40.0  --  42.2  --   --   --  41.0  PLT 111*  --  104*  --   --   --   --   HEPARINUNFRC 0.82*   < > 0.75* 0.57  --  0.44  --   CREATININE 1.69*  --  1.69*  --  1.49*  --   --    < > = values in this interval not displayed.    Estimated Creatinine Clearance: 62.4 mL/min (A) (by C-G formula based on SCr of 1.49 mg/dL (H)).  Assessment: 68 yo M on IV heparin for new onset Afib. Heparin level is now therapeutic at 0.57 on 1050 units/hr of IV heparin. Hgb stable, plts low but stable. Of note, patient is experience some bleeding near the central line site. Thrombi-pad ordered for now but heparin continues to run. MD aware.   Today, 04/18/19   HL 0.44, therapeutic  Hgb 13.9,   plt 104 ( 8/6)   No further bleeding issues per RN, no line issues     Goal of Therapy:  Heparin level 0.3-0.7 units/ml Monitor platelets by anticoagulation protocol: Yes   Plan:  Continue IV heparin at 1050 units/hr   Monitor daily heparin level, CBC, s/s of bleed    Royetta Asal, PharmD, BCPS 04/18/2019 6:42 AM

## 2019-04-18 NOTE — Progress Notes (Signed)
NAME:  Robert [name], MRN:  [id number], DOB:  1951-09-04, LOS: 6 ADMISSION DATE:  04/11/2019, CONSULTATION DATE:  8/1 REFERRING MD:  Dr. Roderic Palau, CHIEF COMPLAINT:  dyspnea   Brief History   68 y/o male admitted on 8/1 in the setting of ARDS from COVID-19 pneumonia.  Required intubation and mechanical ventilation at an outside hospital, has high sedative needs  Past Medical History  Anxiety COPD GERD CAD Hypertension Hyperlipidemia Rheumatoid arthritis History of systolic heart failure, however 08/2018 Echo showed LVEF 50-55%, left atrium dilated  Significant Hospital Events   8/1 admission to [facility] ICU 8/2 extubated 8/3 early AM agitated, placed on BIPAP  Consults:  PCCM  Procedures:  8/1 ETT > 8/2, 8/3 >  8/1 L subclavian CVL >   Significant Diagnostic Tests:  08/2018 Echo> LVEF 50-55%, left atrium dilated  Micro Data:  7/31 blood >  7/31 sars-cov-2 > positive  Antimicrobials:  7/31 Cefepime >  7/31 Flagyl > 7/31 7/31 Vanc > 7/31 8/1 remdesivir > 8/1 actemra >  8/1 decadron >    Interim history/subjective:   Febrile with reported 8 beats of VT on telemetry that resolved without intervention. Weaning on vent, difficult to awaken. Started on precedex overnight due to agitation and vent dyssynchrony. Per nursing, thick purulent sputum via trach  Objective   Blood pressure 131/74, pulse (!) 120, temperature (!) 102 F (38.9 C), resp. rate (!) 35, height 6' (1.829 m), weight 106.6 kg, SpO2 95 %.    Vent Mode: CPAP;PSV FiO2 (%):  [30 %] 30 % Set Rate:  [26 bmp] 26 bmp Vt Set:  [460 mL] 460 mL PEEP:  [5 cmH20] 5 cmH20 Pressure Support:  [12 cmH20] 12 cmH20 Plateau Pressure:  [16 cmH20] 16 cmH20   Intake/Output Summary (Last 24 hours) at 04/18/2019 1648 Last data filed at 04/18/2019 1500 Gross per 24 hour  Intake 3023.1 ml  Output 4370 ml  Net -1346.9 ml   Filed Weights   04/13/19 0500 04/15/19 0230 04/17/19 0118  Weight: 109 kg 106.1 kg 106.6 kg    Examination: General:  In bed on vent HENT: NCAT ETT in place PULM: CTA B, vent supported breathing CV: RRR, no mgr GI: BS+, soft, nontender MSK: normal bulk and tone Neuro: sedated on vent  8/3 CXR images independently reviewed: worsening dense left lower lobe consolidation, worsening bibasilar air space disease  Resolved Hospital Problem list     Assessment & Plan:   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: C/b COPD Exacerbation, pulmonary edema Self-extubated and re-intubated within 24 hours Pressure support as tolerated Extubation precluded by mental status Continue mechanical ventilation per ARDS protocol Target TVol 6-8cc/kgIBW Target Plateau Pressure < 30cm H20 Target driving pressure less than 15 cm of water Target PaO2 55-65: titrate PEEP/FiO2 per protocol Check CVP daily if CVL in place Target CVP less than 4, diurese as necessary Ventilator associated pneumonia prevention protocol  COPD with acute exacerbation Continue solumedrol Continue bronchodilators: Brovana and Pulmicort BID, Duonebs TID Start azithromycin  Fever: likely respiratory infection, ?concern for developing cellulitis per RN Obtain blood and sputum CXR in am Re-examine extremities at bedside Remove unnecessary lines >7 days: PIV x 2 currently day 6 Low threshold to broaden antibiotics with clinical decompensation  Need for sedation/mechanical ventilation RASS target to 0 to -1 Continue precedex Fentanyl prn  VTach Monitor electrolytes K and Mg, goal 4 and 2 respectively Telemetry   Best practice:  Diet: tube feeding Pain/Anxiety/Delirium protocol (if indicated): as above VAP protocol (  if indicated): yes DVT prophylaxis: lovenox GI prophylaxis: Pantoprazole for stress ulcer prophylaxis Glucose control: SSI Mobility: bed rest Code Status: full Family Communication: Updated daughter, [name] 8/7 Disposition: remain in ICU  Labs   CBC: Recent Labs  Lab 04/14/19 0520  04/15/19 0500  04/16/19 0505 04/17/19 0445 04/18/19 0444 04/18/19 1147  WBC 4.9  --  3.7* 4.3 5.5  --  8.9  NEUTROABS 3.6  --  2.3 2.6 3.6  --  7.3  HGB 13.8   < > 12.8* 12.7* 13.5 13.9 14.1  HCT 41.6   < > 40.0 40.0 42.2 41.0 44.9  MCV 90.8  --  91.7 92.2 93.0  --  94.1  PLT 120*  --  112* 111* 104*  --  184   < > = values in this interval not displayed.    Basic Metabolic Panel: Recent Labs  Lab 04/12/19 0708  04/12/19 1610 04/13/19 0435 04/13/19 1625  04/14/19 0520  04/15/19 0500 04/16/19 0505 04/17/19 0445 04/18/19 0420 04/18/19 0426 04/18/19 0444  NA  --    < >  --  135  --    < > 143   < > 142 145 149* 153*  --  154*  K  --    < >  --  4.0  --    < > 4.0   < > 4.6 4.2 4.8 4.8  --  4.9  CL  --   --   --  104  --   --  112*  --  114* 118* 119* 119*  --   --   CO2  --   --   --  22  --   --  23  --  20* 23 23 27   --   --   GLUCOSE  --   --   --  196*  --   --  128*  --  360* 367* 435* 325*  --   --   BUN  --   --   --  44*  --   --  48*  --  63* 63* 72* 70*  --   --   CREATININE 1.44*  --   --  1.74*  --   --  1.54*  --  1.77* 1.69* 1.69* 1.49*  --   --   CALCIUM  --   --   --  7.2*  --   --  7.4*  --  7.3* 7.1* 7.3* 8.2*  --   --   MG 1.4*  --  1.6* 1.9 2.0  --  2.2  --   --   --  2.8*  --  2.8*  --   PHOS 4.1  --  4.2 4.0 3.5  --  3.2  --   --   --   --   --   --   --    < > = values in this interval not displayed.   GFR: Estimated Creatinine Clearance: 62.4 mL/min (A) (by C-G formula based on SCr of 1.49 mg/dL (H)). Recent Labs  Lab 04/11/19 2125 04/12/19 0708  04/14/19 1900 04/15/19 0500 04/16/19 0505 04/17/19 0445 04/18/19 1147  PROCALCITON  --  0.32  --  0.21  --   --   --   --   WBC 4.0 6.1   < >  --  3.7* 4.3 5.5 8.9  LATICACIDVEN 1.2  --   --   --   --   --   --   --    < > =  values in this interval not displayed.    Liver Function Tests: Recent Labs  Lab 04/14/19 0520 04/15/19 0500 04/16/19 0505 04/17/19 0445 04/18/19 0420  AST 104* 74* 73* 82* 44*  ALT  87* 82* 87* 103* 93*  ALKPHOS 65 60 56 56 59  BILITOT 0.4 0.3 0.3 0.6 0.5  PROT 6.0* 5.8* 5.7* 5.7* 6.2*  ALBUMIN 2.4* 2.3* 2.2* 2.3* 2.5*   No results for input(s): LIPASE, AMYLASE in the last 168 hours. No results for input(s): AMMONIA in the last 168 hours.  ABG    Component Value Date/Time   PHART 7.408 04/18/2019 0444   PCO2ART 41.1 04/18/2019 0444   PO2ART 70.0 (L) 04/18/2019 0444   HCO3 25.7 04/18/2019 0444   TCO2 27 04/18/2019 0444   ACIDBASEDEF 6.0 (H) 04/15/2019 0401   O2SAT 93.0 04/18/2019 0444     Coagulation Profile: Recent Labs  Lab 04/11/19 2125  INR 1.1    Cardiac Enzymes: No results for input(s): CKTOTAL, CKMB, CKMBINDEX, TROPONINI in the last 168 hours.  HbA1C: Hgb A1c MFr Bld  Date/Time Value Ref Range Status  04/11/2019 09:50 PM 7.4 (H) 4.8 - 5.6 % Final    Comment:    (NOTE) Pre diabetes:          5.7%-6.4% Diabetes:              >6.4% Glycemic control for   <7.0% adults with diabetes   06/07/2017 12:02 AM 9.1 (H) 4.8 - 5.6 % Final    Comment:    (NOTE) Pre diabetes:          5.7%-6.4% Diabetes:              >6.4% Glycemic control for   <7.0% adults with diabetes     CBG: Recent Labs  Lab 04/18/19 0317 04/18/19 0558 04/18/19 0754 04/18/19 1232 04/18/19 1547  GLUCAP 315* 304* 350* 396* 458*     Critical care time: 32 minutes    The patient is critically ill with multiple organ systems failure and requires high complexity decision making for assessment and support, frequent evaluation and titration of therapies, application of advanced monitoring technologies and extensive interpretation of multiple databases.   Discussed and co-managed patient care with PCCM-Hospitalist. Coordinated care with RT, RN and pharmacist.  [name], M.D. [facility] Pulmonary/Critical Care Medicine 04/18/2019 4:49 PM  Pager: [phone] After hours pager: [phone]

## 2019-04-18 NOTE — Progress Notes (Signed)
Inpatient Diabetes Program Recommendations  AACE/ADA: New Consensus Statement on Inpatient Glycemic Control (2015)  Target Ranges:  Prepandial:   less than 140 mg/dL      Peak postprandial:   less than 180 mg/dL (1-2 hours)      Critically ill patients:  140 - 180 mg/dL        Home DM Meds: Acarbose 100 mg TID       Glipizide 10 mg BID       Toujeo 80 units Daily       Metformin 1500 mg AM/ 1000 mg PM  Current Orders: Lantus 30 units BID      Novolog Resistant Correction Scale/ SSI (0-20 units) Q4 hours      Novolog 5 units Q4 hours Tube Feed Coverage     Remains Intubated.  Getting Decadron 6 mg Daily.  Also getting Tube Feeds 75cc/hr.   MD- Note that Lantus increased to 40 units BID yesterday afternoon. Glucose trends in the 300's. To get second dose of Lantus 40 units this am, however, expect glucose trends to still be high.  Total of Novolog 135 units in a 24 hour period given yesterday.   -   Consider increasing Lantus to 50 units bid  -   Increasing Novolog Tube Feed Coverage, Novolog 12 units Q4 hours  HOLD if Tube Feeds HELD for any reason   --Will follow patient during hospitalization--  Tama Headings RN, MSN, BC-ADM Inpatient Diabetes Coordinator Team Pager [phone] (8a-5p)

## 2019-04-18 NOTE — Progress Notes (Signed)
Pt's latest blood sugar is 458. Dr. Sherral Hammers notified via secure messaging.

## 2019-04-18 NOTE — Progress Notes (Signed)
Critical troponin of 111 called to Dr. Alcario Drought, notified MD that most recent collection has not resulted yet

## 2019-04-19 ENCOUNTER — Inpatient Hospital Stay (HOSPITAL_COMMUNITY): Payer: Medicare HMO

## 2019-04-19 DIAGNOSIS — R6521 Severe sepsis with septic shock: Secondary | ICD-10-CM

## 2019-04-19 DIAGNOSIS — A419 Sepsis, unspecified organism: Secondary | ICD-10-CM

## 2019-04-19 LAB — BASIC METABOLIC PANEL
Anion gap: 4 — ABNORMAL LOW (ref 5–15)
Anion gap: 6 (ref 5–15)
Anion gap: 7 (ref 5–15)
Anion gap: 8 (ref 5–15)
BUN: 86 mg/dL — ABNORMAL HIGH (ref 8–23)
BUN: 85 mg/dL — ABNORMAL HIGH (ref 8–23)
BUN: 81 mg/dL — ABNORMAL HIGH (ref 8–23)
BUN: 76 mg/dL — ABNORMAL HIGH (ref 8–23)
CO2: 26 mmol/L (ref 22–32)
CO2: 24 mmol/L (ref 22–32)
CO2: 23 mmol/L (ref 22–32)
CO2: 24 mmol/L (ref 22–32)
Calcium: 7.9 mg/dL — ABNORMAL LOW (ref 8.9–10.3)
Calcium: 7.8 mg/dL — ABNORMAL LOW (ref 8.9–10.3)
Calcium: 7.2 mg/dL — ABNORMAL LOW (ref 8.9–10.3)
Calcium: 7.5 mg/dL — ABNORMAL LOW (ref 8.9–10.3)
Chloride: 122 mmol/L — ABNORMAL HIGH (ref 98–111)
Chloride: 123 mmol/L — ABNORMAL HIGH (ref 98–111)
Chloride: 123 mmol/L — ABNORMAL HIGH (ref 98–111)
Chloride: 122 mmol/L — ABNORMAL HIGH (ref 98–111)
Creatinine, Ser: 1.64 mg/dL — ABNORMAL HIGH (ref 0.61–1.24)
Creatinine, Ser: 1.47 mg/dL — ABNORMAL HIGH (ref 0.61–1.24)
Creatinine, Ser: 1.38 mg/dL — ABNORMAL HIGH (ref 0.61–1.24)
Creatinine, Ser: 1.3 mg/dL — ABNORMAL HIGH (ref 0.61–1.24)
GFR calc Af Amer: 50 mL/min — ABNORMAL LOW (ref >60)
GFR calc Af Amer: 57 mL/min — ABNORMAL LOW (ref >60)
GFR calc Af Amer: >60 mL/min (ref >60)
GFR calc Af Amer: >60 mL/min (ref >60)
GFR calc non Af Amer: 43 mL/min — ABNORMAL LOW (ref >60)
GFR calc non Af Amer: 49 mL/min — ABNORMAL LOW (ref >60)
GFR calc non Af Amer: 53 mL/min — ABNORMAL LOW (ref >60)
GFR calc non Af Amer: 57 mL/min — ABNORMAL LOW (ref >60)
Glucose, Bld: 375 mg/dL — ABNORMAL HIGH (ref 70–99)
Glucose, Bld: 242 mg/dL — ABNORMAL HIGH (ref 70–99)
Glucose, Bld: 191 mg/dL — ABNORMAL HIGH (ref 70–99)
Glucose, Bld: 110 mg/dL — ABNORMAL HIGH (ref 70–99)
Potassium: 5.3 mmol/L — ABNORMAL HIGH (ref 3.5–5.1)
Potassium: 4.9 mmol/L (ref 3.5–5.1)
Potassium: 4.4 mmol/L (ref 3.5–5.1)
Potassium: 4.6 mmol/L (ref 3.5–5.1)
Sodium: 152 mmol/L — ABNORMAL HIGH (ref 135–145)
Sodium: 153 mmol/L — ABNORMAL HIGH (ref 135–145)
Sodium: 153 mmol/L — ABNORMAL HIGH (ref 135–145)
Sodium: 154 mmol/L — ABNORMAL HIGH (ref 135–145)

## 2019-04-19 LAB — CBC WITH DIFFERENTIAL/PLATELET
Abs Immature Granulocytes: 0.14 10*3/uL — ABNORMAL HIGH (ref 0.00–0.07)
Basophils Absolute: 0.1 10*3/uL (ref 0.0–0.1)
Basophils Relative: 0 %
Eosinophils Absolute: 0 10*3/uL (ref 0.0–0.5)
Eosinophils Relative: 0 %
HCT: 46 % (ref 39.0–52.0)
Hemoglobin: 14.2 g/dL (ref 13.0–17.0)
Immature Granulocytes: 1 %
Lymphocytes Relative: 9 %
Lymphs Abs: 1.5 10*3/uL (ref 0.7–4.0)
MCH: 29.8 pg (ref 26.0–34.0)
MCHC: 30.9 g/dL (ref 30.0–36.0)
MCV: 96.6 fL (ref 80.0–100.0)
Monocytes Absolute: 1.1 10*3/uL — ABNORMAL HIGH (ref 0.1–1.0)
Monocytes Relative: 6 %
Neutro Abs: 14.8 10*3/uL — ABNORMAL HIGH (ref 1.7–7.7)
Neutrophils Relative %: 84 %
Platelets: 279 10*3/uL (ref 150–400)
RBC: 4.76 MIL/uL (ref 4.22–5.81)
RDW: 17.3 % — ABNORMAL HIGH (ref 11.5–15.5)
WBC: 17.6 10*3/uL — ABNORMAL HIGH (ref 4.0–10.5)
nRBC: 0 % (ref 0.0–0.2)

## 2019-04-19 LAB — LACTIC ACID, PLASMA
Lactic Acid, Venous: 1.1 mmol/L (ref 0.5–1.9)
Lactic Acid, Venous: 1.2 mmol/L (ref 0.5–1.9)
Lactic Acid, Venous: 1.6 mmol/L (ref 0.5–1.9)

## 2019-04-19 LAB — COMPREHENSIVE METABOLIC PANEL
ALT: 75 U/L — ABNORMAL HIGH (ref 0–44)
AST: 45 U/L — ABNORMAL HIGH (ref 15–41)
Albumin: 2.5 g/dL — ABNORMAL LOW (ref 3.5–5.0)
Alkaline Phosphatase: 52 U/L (ref 38–126)
Anion gap: 7 (ref 5–15)
BUN: 93 mg/dL — ABNORMAL HIGH (ref 8–23)
CO2: 25 mmol/L (ref 22–32)
Calcium: 8 mg/dL — ABNORMAL LOW (ref 8.9–10.3)
Chloride: 122 mmol/L — ABNORMAL HIGH (ref 98–111)
Creatinine, Ser: 1.7 mg/dL — ABNORMAL HIGH (ref 0.61–1.24)
GFR calc Af Amer: 48 mL/min — ABNORMAL LOW (ref >60)
GFR calc non Af Amer: 41 mL/min — ABNORMAL LOW (ref >60)
Glucose, Bld: 303 mg/dL — ABNORMAL HIGH (ref 70–99)
Potassium: 4.8 mmol/L (ref 3.5–5.1)
Sodium: 154 mmol/L — ABNORMAL HIGH (ref 135–145)
Total Bilirubin: 0.4 mg/dL (ref 0.3–1.2)
Total Protein: 5.7 g/dL — ABNORMAL LOW (ref 6.5–8.1)

## 2019-04-19 LAB — GLUCOSE, CAPILLARY
Glucose-Capillary: 142 mg/dL — ABNORMAL HIGH (ref 70–99)
Glucose-Capillary: 153 mg/dL — ABNORMAL HIGH (ref 70–99)
Glucose-Capillary: 154 mg/dL — ABNORMAL HIGH (ref 70–99)
Glucose-Capillary: 177 mg/dL — ABNORMAL HIGH (ref 70–99)
Glucose-Capillary: 188 mg/dL — ABNORMAL HIGH (ref 70–99)
Glucose-Capillary: 202 mg/dL — ABNORMAL HIGH (ref 70–99)
Glucose-Capillary: 209 mg/dL — ABNORMAL HIGH (ref 70–99)
Glucose-Capillary: 218 mg/dL — ABNORMAL HIGH (ref 70–99)
Glucose-Capillary: 232 mg/dL — ABNORMAL HIGH (ref 70–99)
Glucose-Capillary: 233 mg/dL — ABNORMAL HIGH (ref 70–99)
Glucose-Capillary: 251 mg/dL — ABNORMAL HIGH (ref 70–99)
Glucose-Capillary: 285 mg/dL — ABNORMAL HIGH (ref 70–99)
Glucose-Capillary: 310 mg/dL — ABNORMAL HIGH (ref 70–99)
Glucose-Capillary: 337 mg/dL — ABNORMAL HIGH (ref 70–99)
Glucose-Capillary: 401 mg/dL — ABNORMAL HIGH (ref 70–99)
Glucose-Capillary: 449 mg/dL — ABNORMAL HIGH (ref 70–99)

## 2019-04-19 LAB — D-DIMER, QUANTITATIVE: D-Dimer, Quant: 3.01 ug/mL-FEU — ABNORMAL HIGH (ref 0.00–0.50)

## 2019-04-19 LAB — MAGNESIUM: Magnesium: 3 mg/dL — ABNORMAL HIGH (ref 1.7–2.4)

## 2019-04-19 LAB — C-REACTIVE PROTEIN: CRP: 0.8 mg/dL (ref <1.0)

## 2019-04-19 LAB — BETA-HYDROXYBUTYRIC ACID: Beta-Hydroxybutyric Acid: 0.22 mmol/L (ref 0.05–0.27)

## 2019-04-19 LAB — HEPARIN LEVEL (UNFRACTIONATED): Heparin Unfractionated: 0.42 IU/mL (ref 0.30–0.70)

## 2019-04-19 MED ORDER — FREE WATER
400.0000 mL | Status: DC
  Administered 2019-04-19 (×2): 400 mL

## 2019-04-19 MED ORDER — ALBUMIN HUMAN 25 % IV SOLN
25.0000 g | Freq: Once | INTRAVENOUS | Status: AC
  Administered 2019-04-19: 17:00:00 25 g via INTRAVENOUS
  Filled 2019-04-19: qty 50

## 2019-04-19 MED ORDER — VANCOMYCIN HCL 10 G IV SOLR
2000.0000 mg | Freq: Once | INTRAVENOUS | Status: AC
  Administered 2019-04-19: 16:00:00 2000 mg via INTRAVENOUS
  Filled 2019-04-19: qty 2000

## 2019-04-19 MED ORDER — DEXTROSE-NACL 5-0.45 % IV SOLN
INTRAVENOUS | Status: DC
  Administered 2019-04-19 – 2019-04-20 (×2): via INTRAVENOUS

## 2019-04-19 MED ORDER — FREE WATER
300.0000 mL | Status: DC
  Administered 2019-04-19 – 2019-04-20 (×6): 300 mL

## 2019-04-19 MED ORDER — INSULIN GLARGINE 100 UNIT/ML ~~LOC~~ SOLN
25.0000 [IU] | Freq: Once | SUBCUTANEOUS | Status: AC
  Administered 2019-04-19: 17:00:00 25 [IU] via SUBCUTANEOUS
  Filled 2019-04-19: qty 0.25

## 2019-04-19 MED ORDER — PIPERACILLIN-TAZOBACTAM 3.375 G IVPB
3.3750 g | Freq: Three times a day (TID) | INTRAVENOUS | Status: DC
  Administered 2019-04-19 – 2019-04-21 (×5): 3.375 g via INTRAVENOUS
  Filled 2019-04-19 (×7): qty 50

## 2019-04-19 MED ORDER — FUROSEMIDE 10 MG/ML IJ SOLN
40.0000 mg | Freq: Once | INTRAMUSCULAR | Status: AC
  Administered 2019-04-19: 16:00:00 40 mg via INTRAVENOUS
  Filled 2019-04-19: qty 4

## 2019-04-19 MED ORDER — INSULIN ASPART 100 UNIT/ML ~~LOC~~ SOLN
0.0000 [IU] | SUBCUTANEOUS | Status: DC
  Administered 2019-04-19: 21:00:00 4 [IU] via SUBCUTANEOUS
  Administered 2019-04-20 (×3): 7 [IU] via SUBCUTANEOUS
  Administered 2019-04-20: 02:00:00 11 [IU] via SUBCUTANEOUS
  Administered 2019-04-20: 08:00:00 4 [IU] via SUBCUTANEOUS

## 2019-04-19 MED ORDER — PIPERACILLIN-TAZOBACTAM 3.375 G IVPB 30 MIN
3.3750 g | Freq: Once | INTRAVENOUS | Status: AC
  Administered 2019-04-19: 15:00:00 3.375 g via INTRAVENOUS
  Filled 2019-04-19: qty 50

## 2019-04-19 MED ORDER — VANCOMYCIN HCL 10 G IV SOLR
1250.0000 mg | INTRAVENOUS | Status: DC
  Filled 2019-04-19: qty 1250

## 2019-04-19 NOTE — Progress Notes (Signed)
Pharmacy Antibiotic Note  IZIAH CATES is a 68 y.o. male admitted on 04/11/2019 with pneumonia.  Pharmacy has been consulted for vancomycin and zosyn dosing. He is currently on IV azithromycin but has worsening leukocytosis and ongoing fevers.   Plan: -Zosyn 3.3375 gm IV Q 8 hours -Vancomycin 2 gm IV once, then start Vancomycin 1250 mg IV Q 24 hrs. Goal AUC 400-550. Expected AUC: 485 SCr used: 1.47 -Monitor levels as indicated  -F/u cultures    Height: 6' (182.9 cm) Weight: 227 lb 11.8 oz (103.3 kg) IBW/kg (Calculated) : 77.6  Temp (24hrs), Avg:99.8 F (37.7 C), Min:97.9 F (36.6 C), Max:102.2 F (39 C)  Recent Labs  Lab 04/15/19 0500 04/16/19 0505 04/17/19 0445 04/18/19 0420 04/18/19 1147 04/18/19 2100 04/18/19 2338 04/19/19 0340 04/19/19 0515 04/19/19 0830  WBC 3.7* 4.3 5.5  --  8.9  --   --   --  17.6*  --   CREATININE 1.77* 1.69* 1.69* 1.49*  --  1.26*  --  1.64* 1.70* 1.47*  LATICACIDVEN  --   --   --   --   --   --  1.6 1.2  --   --     Estimated Creatinine Clearance: 62.3 mL/min (A) (by C-G formula based on SCr of 1.47 mg/dL (H)).    Allergies  Allergen Reactions  . Ivp Dye [Iodinated Diagnostic Agents] Itching and Rash    7/31 vancomycin >> 8/1 7/31 cefepime >> 8/5 7/31 metronidazole x1 8/1 Remdesivir >> 8/5 8/1 Actemra  (PTA tocilizumab 162mg  SQ weekly on Mondays for RA)  8/7 Azithro >>   8/1 TA >> negF 7/31 BC x2:  ngtd 7/31 UCx:  neg 7/31 SARS CoV-2: positive 8/7 TA >> pending  8/7 BCx >> ngtd   Thank you for allowing pharmacy to be a part of this patient's care.  Albertina Parr, PharmD., BCPS Clinical Pharmacist Clinical phone for 04/19/19 until 5pm: [phone]

## 2019-04-19 NOTE — Progress Notes (Signed)
ANTICOAGULATION & ANTIBIOTIC CONSULT NOTE - Follow Up Consult  Pharmacy Consult for heparin Indication: atrial fibrillation  Allergies  Allergen Reactions  . Ivp Dye [Iodinated Diagnostic Agents] Itching and Rash    Patient Measurements: Height: 6' (182.9 cm) Weight: 227 lb 11.8 oz (103.3 kg) IBW/kg (Calculated) : 77.6 Heparin Dosing Weight: 98.5 kg  Vital Signs: Temp: 98.8 F (37.1 C) (08/08 0515) Temp Source: Esophageal (08/08 0400) BP: 96/58 (08/08 0515) Pulse Rate: 88 (08/08 0515)  Labs: Recent Labs    04/17/19 0445 04/17/19 1230 04/18/19 0420 04/18/19 0426 04/18/19 0444 04/18/19 0631 04/18/19 1147 04/18/19 2100 04/19/19 0340 04/19/19 0515  HGB 13.5  --   --   --  13.9  --  14.1  --   --  14.2  HCT 42.2  --   --   --  41.0  --  44.9  --   --  46.0  PLT 104*  --   --   --   --   --  184  --   --  279  HEPARINUNFRC 0.75* 0.57  --  0.44  --   --   --   --   --  0.42  CREATININE 1.69*  --  1.49*  --   --   --   --  1.26* 1.64*  --   TROPONINIHS  --   --  111*  --   --  118*  --   --   --   --     Estimated Creatinine Clearance: 55.8 mL/min (A) (by C-G formula based on SCr of 1.64 mg/dL (H)).  Assessment: 68 yo M on IV heparin for new onset Afib. Heparin level is now therapeutic at 0.57 on 1050 units/hr of IV heparin. Hgb stable, plts low but stable. Of note, patient is experience some bleeding near the central line site. Thrombi-pad ordered for on 8/6 but heparin continues to run. MD aware.   Today, 04/19/19   HL 0.42, therapeutic  Hgb 14.2  plt 279  No bleeding issues per RN, no line issues     Goal of Therapy:  Heparin level 0.3-0.7 units/ml Monitor platelets by anticoagulation protocol: Yes   Plan:   Continue IV heparin at 1050 units/hr    Monitor daily heparin level, CBC, s/s of bleed    Royetta Asal, PharmD, BCPS 04/19/2019 6:50 AM

## 2019-04-19 NOTE — Progress Notes (Signed)
NAME:  Robert [name], MRN:  [id number], DOB:  [birth year], LOS: 7 ADMISSION DATE:  04/11/2019, CONSULTATION DATE:  8/1 REFERRING MD:  Dr. Roderic Palau, CHIEF COMPLAINT:  dyspnea   Brief History   68 y/o male admitted on 8/1 in the setting of ARDS from COVID-19 pneumonia.  Required intubation and mechanical ventilation at an outside hospital, has high sedative needs  Past Medical History  Anxiety COPD GERD CAD Hypertension Hyperlipidemia Rheumatoid arthritis History of systolic heart failure, however 08/2018 Echo showed LVEF 50-55%, left atrium dilated  Significant Hospital Events   8/1 admission to [facility] ICU 8/2 extubated 8/3 early AM agitated, placed on BIPAP  Consults:  PCCM  Procedures:  8/1 ETT > 8/2, 8/3 >  8/1 L subclavian CVL >   Significant Diagnostic Tests:  08/2018 Echo> LVEF 50-55%, left atrium dilated  Micro Data:  7/31 blood >  7/31 sars-cov-2 > positive  Antimicrobials:  7/31 Cefepime >  7/31 Flagyl > 7/31 7/31 Vanc > 7/31 8/1 remdesivir > 8/1 actemra >  8/1 decadron >    Interim history/subjective:   Overnight, febrile and hypotensive. Started on Neo. Tolerating PS. Opens eyes to voice. No purposeful movements.  Objective   Blood pressure 133/68, pulse (!) 102, temperature 100 F (37.8 C), resp. rate (!) 30, height 6' (1.829 m), weight 103.3 kg, SpO2 98 %.    Vent Mode: PSV;BIPAP FiO2 (%):  [30 %-40 %] 40 % Set Rate:  [26 bmp] 26 bmp Vt Set:  [460 mL] 460 mL PEEP:  [5 cmH20] 5 cmH20 Pressure Support:  [12 cmH20] 12 cmH20 Plateau Pressure:  [15 cmH20-21 cmH20] 15 cmH20   Intake/Output Summary (Last 24 hours) at 04/19/2019 1428 Last data filed at 04/19/2019 1300 Gross per 24 hour  Intake 5798.85 ml  Output 2155 ml  Net 3643.85 ml   Filed Weights   04/15/19 0230 04/17/19 0118 04/19/19 0500  Weight: 106.1 kg 106.6 kg 103.3 kg   Physical Exam: General: Critically-ill male, vented, sedated HENT: [place], AT, ETT in place Eyes: EOMI, no scleral  icterus Respiratory: Clear to auscultation bilaterally.  No crackles, wheezing or rales Cardiovascular: RRR, -M/R/G, no JVD GI: BS+, soft, nontender Extremities:-Edema,-tenderness Skin: Hyperpigmentation over anterior aspect of LLE/peripheral vascular changes Neuro: Opens eyes to voice, does not follows commands GU: Condom cath in place  8/8 CXR reviewed personally by me: Improved bilateral airspace disease with improved aeration. No effusion or edema.  Resolved Hospital Problem list     Assessment & Plan:   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: C/b COPD Exacerbation, pulmonary edema Self-extubated and re-intubated within 24 hours Pressure support as tolerated Continue mechanical ventilation per ARDS protocol Target TVol 6-8cc/kgIBW Target Plateau Pressure < 30cm H20 Target driving pressure less than 15 cm of water Target PaO2 55-65: titrate PEEP/FiO2 per protocol Ventilator associated pneumonia prevention protocol  Septic shock: unclear etiology. Initially though respiratory source however improved lung aeration on imaging. No evidence of cellulitis Started on broad spectrum antibiotics F/u blood and respiratory cultures Obtain LA and trend if elevated Transition neo to levophed to maintain MAP >65 Remove old lines: PIV x 2 >7 days  COPD with acute exacerbation Complete solumedrol Continue bronchodilators: Brovana and Pulmicort BID, Duonebs TID Antibiotics as above  Need for sedation/mechanical ventilation RASS target to 0 to -1 Continue precedex Fentanyl prn  VTach Monitor electrolytes K and Mg, goal 4 and 2 respectively Telemetry  Hypernatremia FWF per TRH 1/2NS Trend BMP  Best practice:  Diet: tube feeding Pain/Anxiety/Delirium  protocol (if indicated): as above VAP protocol (if indicated): yes DVT prophylaxis: lovenox GI prophylaxis: Pantoprazole for stress ulcer prophylaxis Glucose control: SSI Mobility: bed rest Code Status: full Family Communication:  Updated daughter and son on 8/8 Disposition: remain in ICU  Labs   CBC: Recent Labs  Lab 04/15/19 0500 04/16/19 0505 04/17/19 0445 04/18/19 0444 04/18/19 1147 04/19/19 0515  WBC 3.7* 4.3 5.5  --  8.9 17.6*  NEUTROABS 2.3 2.6 3.6  --  7.3 14.8*  HGB 12.8* 12.7* 13.5 13.9 14.1 14.2  HCT 40.0 40.0 42.2 41.0 44.9 46.0  MCV 91.7 92.2 93.0  --  94.1 96.6  PLT 112* 111* 104*  --  184 833    Basic Metabolic Panel: Recent Labs  Lab 04/12/19 1610  04/13/19 0435 04/13/19 1625  04/14/19 0520  04/17/19 0445  04/18/19 0426  04/18/19 2100 04/19/19 0340 04/19/19 0515 04/19/19 0830 04/19/19 1254  NA  --   --  135  --    < > 143   < > 149*   < >  --    < > 163* 152* 154* 153* 153*  K  --   --  4.0  --    < > 4.0   < > 4.8   < >  --    < > 5.7* 5.3* 4.8 4.9 4.4  CL  --    < > 104  --   --  112*   < > 119*   < >  --   --  111 122* 122* 123* 123*  CO2  --    < > 22  --   --  23   < > 23   < >  --   --  23 26 25 24 23   GLUCOSE  --    < > 196*  --   --  128*   < > 435*   < >  --   --  483* 375* 303* 242* 191*  BUN  --    < > 44*  --   --  48*   < > 72*   < >  --   --  88* 86* 93* 85* 81*  CREATININE  --    < > 1.74*  --   --  1.54*   < > 1.69*   < >  --   --  1.26* 1.64* 1.70* 1.47* 1.38*  CALCIUM  --    < > 7.2*  --   --  7.4*   < > 7.3*   < >  --   --  7.3* 7.9* 8.0* 7.8* 7.2*  MG 1.6*  --  1.9 2.0  --  2.2  --  2.8*  --  2.8*  --   --   --  3.0*  --   --   PHOS 4.2  --  4.0 3.5  --  3.2  --   --   --   --   --   --   --   --   --   --    < > = values in this interval not displayed.   GFR: Estimated Creatinine Clearance: 66.3 mL/min (A) (by C-G formula based on SCr of 1.38 mg/dL (H)). Recent Labs  Lab 04/14/19 1900  04/16/19 0505 04/17/19 0445 04/18/19 1147 04/18/19 2338 04/19/19 0340 04/19/19 0515  PROCALCITON 0.21  --   --   --   --   --   --   --  WBC  --    < > 4.3 5.5 8.9  --   --  17.6*  LATICACIDVEN  --   --   --   --   --  1.6 1.2  --    < > = values in this interval  not displayed.    Liver Function Tests: Recent Labs  Lab 04/15/19 0500 04/16/19 0505 04/17/19 0445 04/18/19 0420 04/19/19 0515  AST 74* 73* 82* 44* 45*  ALT 82* 87* 103* 93* 75*  ALKPHOS 60 56 56 59 52  BILITOT 0.3 0.3 0.6 0.5 0.4  PROT 5.8* 5.7* 5.7* 6.2* 5.7*  ALBUMIN 2.3* 2.2* 2.3* 2.5* 2.5*   No results for input(s): LIPASE, AMYLASE in the last 168 hours. No results for input(s): AMMONIA in the last 168 hours.  ABG    Component Value Date/Time   PHART 7.408 04/18/2019 0444   PCO2ART 41.1 04/18/2019 0444   PO2ART 70.0 (L) 04/18/2019 0444   HCO3 25.7 04/18/2019 0444   TCO2 27 04/18/2019 0444   ACIDBASEDEF 6.0 (H) 04/15/2019 0401   O2SAT 93.0 04/18/2019 0444     Coagulation Profile: No results for input(s): INR, PROTIME in the last 168 hours.  Cardiac Enzymes: No results for input(s): CKTOTAL, CKMB, CKMBINDEX, TROPONINI in the last 168 hours.  HbA1C: Hgb A1c MFr Bld  Date/Time Value Ref Range Status  04/11/2019 09:50 PM 7.4 (H) 4.8 - 5.6 % Final    Comment:    (NOTE) Pre diabetes:          5.7%-6.4% Diabetes:              >6.4% Glycemic control for   <7.0% adults with diabetes   06/07/2017 12:02 AM 9.1 (H) 4.8 - 5.6 % Final    Comment:    (NOTE) Pre diabetes:          5.7%-6.4% Diabetes:              >6.4% Glycemic control for   <7.0% adults with diabetes     CBG: Recent Labs  Lab 04/19/19 0905 04/19/19 1010 04/19/19 1126 04/19/19 1241 04/19/19 1345  GLUCAP 218* 202* 209* 188* 177*     Critical care time: 33 minutes    The patient is critically ill with multiple organ systems failure and requires high complexity decision making for assessment and support, frequent evaluation and titration of therapies, application of advanced monitoring technologies and extensive interpretation of multiple databases.   Discussed and co-managed patient care with PCCM-Hospitalist. Coordinated care with RT, RN and pharmacist.  [name], M.D. [facility]  Pulmonary/Critical Care Medicine 04/19/2019 2:28 PM  Pager: [phone] After hours pager: [phone]

## 2019-04-19 NOTE — Progress Notes (Signed)
Repeat BMP shows sodium of 152 with similar appearing other numbers.  He has only been on half NS at 75 cc/hr and FWF 300cc Q4H for treatment.  At this point I believe the 163 on prior BMP was a lab error as 152, while still high and give a corrected sodium of 159, would be significantly more in line with his prior sodium results from earlier in the day.  Also the anion gap of 29 would then be a lab error as well explained by the erroneous sodium, explaining the rapid normalization and why both his lactate and BHB were normal.  Still, he remains hypernatremic, and will leave him on the half NS and FWF.  BMPs are ordered Q4H for the moment to follow the sodium.

## 2019-04-19 NOTE — Progress Notes (Signed)
[place]ott[place] noted to patient's skin from BLE to mid abdomen. Notified Sherral Hammers, MD. To bedside to assess patient.

## 2019-04-19 NOTE — Progress Notes (Signed)
RN spoke to family and answered their questions.

## 2019-04-19 NOTE — Progress Notes (Signed)
PROGRESS NOTE    ANTAEUS [name]  DVV:[id number] DOB: 06/14/1951 DOA: 04/11/2019 PCP: Redmond School, MD   Brief Narrative:  69 year old WM PMHx COPD, coronary artery disease, diabetes, ischemic cardiomyopathy, rheumatoid arthritis,   Presents to the hospital with complaints of progressive worsening shortness of breath.  Robert [name] was mildly febrile and had worsening shortness of breath so Robert [name] called EMS.  In the ER, Robert [name] was noted to be substantially hypoxic, had mental status changes.  Robert [name] tested positive for COVID-19.  Robert [name] was intubated in the emergency room for persistent hypoxia.  Robert [name] has been transferred to [facility] for further management.   Subjective: 8/8   T-max overnight 39 C   A/O x0, sedated/intubated.  Per RN does bite down when mouth care provided.   Assessment & Plan:   Principal Problem:   Pneumonia due to COVID-19 virus Active Problems:   Rheumatoid arthritis ([place])   Hyperlipidemia   COPD (chronic obstructive pulmonary disease) (HCC)   Controlled type 2 diabetes mellitus with diabetic peripheral angiopathy without gangrene, with long-term current use of insulin (HCC)   Essential hypertension   Ischemic cardiomyopathy   Coronary artery disease   Dyslipidemia (high LDL; low HDL)   COVID-19 virus detected   Acute respiratory distress syndrome (ARDS) due to COVID-19 virus   Atrial fibrillation with RVR (HCC)   COPD (chronic obstructive pulmonary disease) with emphysema (HCC)   AKI (acute kidney injury) ([place])   Diabetes mellitus type 2, uncontrolled, with complications (HCC)   Pressure injury of skin  Acute respiratory failure with hypoxia/COVID pneumonia - 8/1 Remdesivir, Actemra, Decadron - Extubated 8/2--> on 8/3 required reintubation - Reintubation was thought secondary to a bacterial superinfection overlaid on his COVID pneumonia -Appropriate antibiotics started see below -Titrate sedating medication down; - 8/6 we will give patient 3-day burst of  high-dose steroids.  Solu-Medrol 40 mg BID - Increased secretions, overnight T-max 38.5 C.  Possible superinfection brewing. - Tracheal aspirate pending - Start Z-Pak--> DC'd see antibiotics below  Sepsis --8/8 T-max overnight 39 C while on antibiotic - Panculture pending - Change antibiotics to broader spectrum  COPD - See acute respiratory failure  A. fib with RVR - Currently NSR -CHADSVASc score of 5, currently on heparin -Metoprolol 25 mg twice daily - Strict in and out +7.2 L -Daily weight Filed Weights   04/15/19 0230 04/17/19 0118 04/19/19 0500  Weight: 106.1 kg 106.6 kg 103.3 kg  - Daily CVP  8/8 CVP = 15 -8/8 albumin 25 g + Lasix 40 mg  Ischemic cardiomyopathy - See A. Fib -Patient had episode of hypotension and was placed on phenyl ephedrine has now been weaned off. - See A. fib RVR  Essential HTN -See A. fib RVR  Hypernatremia - 0.45% saline at 52m/hr - 8/8 increase free water 3073mq 4hr  CAD/stents -Aspirin + Plavix  AKI (baseline Cr~0.8) -Mostly multifactorial to include infection, hypotension/hypoperfusion Recent Labs  Lab 04/17/19 0445 04/18/19 0420 04/18/19 2100 04/19/19 0340 04/19/19 0515  CREATININE 1.69* 1.49* 1.26* 1.64* 1.70*  -Slowly improving   Rheumatoid arthritis -Chronically on methotrexate and Actemra  Diabetes type 2 uncontrolled -7/3 1 hemoglobin A1c= 7.4 -8/7 patient placed on glucose stabilizer protocol overnight -8/8 after patient CBG<200 for 3 checks start D5-0.45% saline -Lantus 25 units: 2 hours after administration discontinue glucose stabilizer protocol -Resistant SSI      DVT prophylaxis: Heparin Code Status: Full Family Communication:  Disposition Plan: TBD   Consultants:  PCCM  Procedures/Significant Events:  8/8  PCXR; significant improvement of bilateral patchy opacifications when compared to previous x-ray    I have personally reviewed and interpreted all radiology studies and my findings  are as above.  VENTILATOR SETTINGS: Ventilator mode: PSVT: BiPAP  FiO2, 40% Pressure control: 12 PEEP: 5   Cultures 8/7 blood RIGHT AC NGTD 8/7 blood RIGHT hand NGTD 8/7 tracheal aspirate pending 8/8 urine pending    Antimicrobials: Anti-infectives (From admission, onward)   Start     Stop   04/20/19 1400  vancomycin (VANCOCIN) 1,250 mg in sodium chloride 0.9 % 250 mL IVPB         04/19/19 2200  piperacillin-tazobactam (ZOSYN) IVPB 3.375 g         04/19/19 1430  vancomycin (VANCOCIN) 2,000 mg in sodium chloride 0.9 % 500 mL IVPB         04/19/19 1400  piperacillin-tazobactam (ZOSYN) IVPB 3.375 g     04/19/19 1530   04/19/19 1000  azithromycin (ZITHROMAX) tablet 250 mg     04/23/19 0959   04/18/19 1300  azithromycin (ZITHROMAX) tablet 500 mg     04/18/19 1351   04/13/19 1000  remdesivir 100 mg in sodium chloride 0.9 % 250 mL IVPB     04/16/19 1103   04/12/19 1000  remdesivir 200 mg in sodium chloride 0.9 % 250 mL IVPB     04/12/19 1317   04/12/19 0600  ceFEPIme (MAXIPIME) 2 g in sodium chloride 0.9 % 100 mL IVPB  Status:  Discontinued     04/15/19 1452   04/12/19 0200  vancomycin (VANCOCIN) IVPB 750 mg/150 ml premix  Status:  Discontinued     04/12/19 1455   04/11/19 2130  ceFEPIme (MAXIPIME) 2 g in sodium chloride 0.9 % 100 mL IVPB     04/11/19 2257   04/11/19 2130  metroNIDAZOLE (FLAGYL) IVPB 500 mg     04/11/19 2356   04/11/19 2130  vancomycin (VANCOCIN) IVPB 1000 mg/200 mL premix     04/12/19 0105        Devices    LINES / TUBES:  #7.5 cuffed ETT 8/3>>    Continuous Infusions: . sodium chloride 75 mL/hr at 04/19/19 0500  . dexmedetomidine (PRECEDEX) IV infusion 0.5 mcg/kg/hr (04/19/19 0706)  . feeding supplement (VITAL AF 1.2 CAL) 1,000 mL (04/18/19 0836)  . heparin 1,050 Units/hr (04/19/19 0500)  . insulin 15.5 Units/hr (04/19/19 0658)  . phenylephrine (NEO-SYNEPHRINE) Adult infusion 60 mcg/min (04/19/19 0707)     Objective: Vitals:    04/19/19 0500 04/19/19 0515 04/19/19 0600 04/19/19 0700  BP: (!) 102/59 (!) 96/58 (!) 125/57 (!) 109/58  Pulse: 90 88 88 85  Resp: (!) 27 (!) 26 (!) 28 (!) 26  Temp: 98.8 F (37.1 C) 98.8 F (37.1 C) 98.1 F (36.7 C) 97.9 F (36.6 C)  TempSrc:      SpO2: 95% 94% 95% 95%  Weight: 103.3 kg     Height:        Intake/Output Summary (Last 24 hours) at 04/19/2019 0748 Last data filed at 04/19/2019 0500 Gross per 24 hour  Intake 3208.22 ml  Output 1925 ml  Net 1283.22 ml   Filed Weights   04/15/19 0230 04/17/19 0118 04/19/19 0500  Weight: 106.1 kg 106.6 kg 103.3 kg   Physical Exam:  General: A/O x0 (sedated/intubated), positive acute respiratory distress Eyes: negative scleral hemorrhage, negative anisocoria, negative icterus ENT: Negative Runny nose, negative gingival bleeding, Neck:  Negative scars, masses, torticollis, lymphadenopathy, JVD #7.5 ETT tube placed negative  sign of infection or bleeding Lungs: Tachypneic clear to auscultation bilaterally without wheezes or crackles Cardiovascular: Tachycardic without murmur gallop or rub normal S1 and S2 Abdomen: negative abdominal pain, nondistended, positive soft, bowel sounds, no rebound, no ascites, no appreciable mass Extremities: No significant cyanosis, clubbing, or edema bilateral lower extremities Skin: Negative rashes, lesions, ulcers Psychiatric: Unable to obtain secondary to patient's intubation/sedation   Central nervous system: Does not respond to painful stimuli     Data Reviewed: Care during the described time interval was provided by me .  I have reviewed this patient's available data, including medical history, events of note, physical examination, and all test results as part of my evaluation.   CBC: Recent Labs  Lab 04/15/19 0500 04/16/19 0505 04/17/19 0445 04/18/19 0444 04/18/19 1147 04/19/19 0515  WBC 3.7* 4.3 5.5  --  8.9 17.6*  NEUTROABS 2.3 2.6 3.6  --  7.3 14.8*  HGB 12.8* 12.7* 13.5 13.9 14.1 14.2   HCT 40.0 40.0 42.2 41.0 44.9 46.0  MCV 91.7 92.2 93.0  --  94.1 96.6  PLT 112* 111* 104*  --  184 628   Basic Metabolic Panel: Recent Labs  Lab 04/12/19 1610  04/13/19 0435 04/13/19 1625  04/14/19 0520  04/17/19 0445 04/18/19 0420 04/18/19 0426 04/18/19 0444 04/18/19 2100 04/19/19 0340 04/19/19 0515  NA  --   --  135  --    < > 143   < > 149* 153*  --  154* 163* 152* 154*  K  --   --  4.0  --    < > 4.0   < > 4.8 4.8  --  4.9 5.7* 5.3* 4.8  CL  --    < > 104  --   --  112*   < > 119* 119*  --   --  111 122* 122*  CO2  --    < > 22  --   --  23   < > 23 27  --   --  _0 GLUCOSE  --    < > 196*  --   --  128*   < > 435* 325*  --   --  483* 375* 303*  BUN  --    < > 44*  --   --  48*   < > 72* 70*  --   --  88* 86* 93*  CREATININE  --    < > 1.74*  --   --  1.54*   < > 1.69* 1.49*  --   --  1.26* 1.64* 1.70*  CALCIUM  --    < > 7.2*  --   --  7.4*   < > 7.3* 8.2*  --   --  7.3* 7.9* 8.0*  MG 1.6*  --  1.9 2.0  --  2.2  --  2.8*  --  2.8*  --   --   --  3.0*  PHOS 4.2  --  4.0 3.5  --  3.2  --   --   --   --   --   --   --   --    < > = values in this interval not displayed.   GFR: Estimated Creatinine Clearance: 53.9 mL/min (A) (by C-G formula based on SCr of 1.7 mg/dL (H)). Liver Function Tests: Recent Labs  Lab 04/15/19 0500 04/16/19 0505 04/17/19 0445 04/18/19 0420 04/19/19 0515  AST 74* 73* 82* 44* 45*  ALT 82* 87* 103* 93* 75*  ALKPHOS 60 56 56 59 52  BILITOT 0.3 0.3 0.6 0.5 0.4  PROT 5.8* 5.7* 5.7* 6.2* 5.7*  ALBUMIN 2.3* 2.2* 2.3* 2.5* 2.5*   No results for input(s): LIPASE, AMYLASE in the last 168 hours. No results for input(s): AMMONIA in the last 168 hours. Coagulation Profile: No results for input(s): INR, PROTIME in the last 168 hours. Cardiac Enzymes: No results for input(s): CKTOTAL, CKMB, CKMBINDEX, TROPONINI in the last 168 hours. BNP (last 3 results) No results for input(s): PROBNP in the last 8760 hours. HbA1C: No results for input(s):  HGBA1C in the last 72 hours. CBG: Recent Labs  Lab 04/19/19 0244 04/19/19 0347 04/19/19 0450 04/19/19 0548 04/19/19 0700  GLUCAP 337* 310* 285* 251* 232*   Lipid Profile: No results for input(s): CHOL, HDL, LDLCALC, TRIG, CHOLHDL, LDLDIRECT in the last 72 hours. Thyroid Function Tests: Recent Labs    04/18/19 0420  TSH 0.927   Anemia Panel: No results for input(s): VITAMINB12, FOLATE, FERRITIN, TIBC, IRON, RETICCTPCT in the last 72 hours. Urine analysis:    Component Value Date/Time   COLORURINE YELLOW 04/11/2019 2125   APPEARANCEUR CLEAR 04/11/2019 2125   LABSPEC 1.025 04/11/2019 2125   PHURINE 5.0 04/11/2019 2125   GLUCOSEU 50 (A) 04/11/2019 2125   HGBUR SMALL (A) 04/11/2019 2125   BILIRUBINUR NEGATIVE 04/11/2019 2125   KETONESUR 5 (A) 04/11/2019 2125   PROTEINUR >=300 (A) 04/11/2019 2125   UROBILINOGEN 0.2 12/22/2011 2130   NITRITE NEGATIVE 04/11/2019 2125   LEUKOCYTESUR NEGATIVE 04/11/2019 2125   Sepsis Labs: _0 (procalcitonin:4,lacticidven:4)  ) Recent Results (from the past 240 hour(s))  Novel Coronavirus, NAA ([facility])     Status: Abnormal   Collection Time: 04/11/19  8:21 AM   Specimen: Nasal Swab  Result Value Ref Range Status   SARS-CoV-2, NAA Detected (A) Not Detected Final    Comment: This test was developed and its performance characteristics determined by [employer]. This test has not been FDA cleared or approved. This test has been authorized by FDA under an Emergency Use Authorization (EUA). This test is only authorized for the duration of time the declaration that circumstances exist justifying the authorization of the emergency use of in vitro diagnostic tests for detection of SARS-CoV-2 virus and/or diagnosis of COVID-19 infection under section 564(b)(1) of the Act, 21 U.S.C. 867EHM-0(N)(4), unless the authorization is terminated or revoked sooner. When diagnostic testing is negative, the possibility of a false negative  result should be considered in the context of a patient's recent exposures and the presence of clinical signs and symptoms consistent with COVID-19. An individual without symptoms of COVID-19 and who is not shedding SARS-CoV-2 virus would expect to have a negative (not detected) result in this assay.   Blood Culture (routine x 2)     Status: None   Collection Time: 04/11/19  9:25 PM   Specimen: BLOOD  Result Value Ref Range Status   Specimen Description BLOOD  Final   Special Requests NONE  Final   Culture   Final    NO GROWTH 5 DAYS Performed at [facility], [address], [place], [place] [address]    Report Status 04/16/2019 FINAL  Final  Urine culture     Status: None   Collection Time: 04/11/19  9:25 PM   Specimen: In/Out Cath Urine  Result Value Ref Range Status   Specimen Description   Final    IN/OUT CATH URINE Performed at [facility], [place]  [address] [place], [place] [address]    Special Requests   Final    NONE Performed at [facility], [address], [place], [place] [address]    Culture   Final    NO GROWTH Performed at [facility], [place] [address], [place], [place] [address]    Report Status 04/13/2019 FINAL  Final  Blood Culture (routine x 2)     Status: None   Collection Time: 04/11/19  9:45 PM   Specimen: BLOOD RIGHT ARM  Result Value Ref Range Status   Specimen Description BLOOD RIGHT ARM  Final   Special Requests   Final    BOTTLES DRAWN AEROBIC AND ANAEROBIC Blood Culture adequate volume   Culture   Final    NO GROWTH 5 DAYS Performed at [facility], [address], [place], [place] [address]    Report Status 04/16/2019 FINAL  Final  SARS Coronavirus 2 [facility] order, Performed in [facility] hospital lab) Nasopharyngeal Nasopharyngeal Swab     Status: Abnormal   Collection Time: 04/11/19  9:50 PM   Specimen: Nasopharyngeal Swab  Result Value Ref Range Status   SARS Coronavirus 2 POSITIVE (A) NEGATIVE Final    Comment: RESULT CALLED TO,  READ BACK BY AND VERIFIED WITH: TURNER,C. AT 2356 ON 04/11/2019 (NOTE) If result is NEGATIVE SARS-CoV-2 target nucleic acids are NOT DETECTED. The SARS-CoV-2 RNA is generally detectable in upper and lower  respiratory specimens during the acute phase of infection. The lowest  concentration of SARS-CoV-2 viral copies this assay can detect is 250  copies / mL. A negative result does not preclude SARS-CoV-2 infection  and should not be used as the sole basis for treatment or other  patient management decisions.  A negative result may occur with  improper specimen collection / handling, submission of specimen other  than nasopharyngeal swab, presence of viral mutation(s) within the  areas targeted by this assay, and inadequate number of viral copies  (<250 copies / mL). A negative result must be combined with clinical  observations, patient history, and epidemiological information. If result is POSITIVE SARS-CoV-2 target nucleic acids are DETECTED. The S ARS-CoV-2 RNA is generally detectable in upper and lower  respiratory specimens during the acute phase of infection.  Positive  results are indicative of active infection with SARS-CoV-2.  Clinical  correlation with patient history and other diagnostic information is  necessary to determine patient infection status.  Positive results do  not rule out bacterial infection or co-infection with other viruses. If result is PRESUMPTIVE POSTIVE SARS-CoV-2 nucleic acids MAY BE PRESENT.   A presumptive positive result was obtained on the submitted specimen  and confirmed on repeat testing.  While 2019 novel coronavirus  (SARS-CoV-2) nucleic acids may be present in the submitted sample  additional confirmatory testing may be necessary for epidemiological  and / or clinical management purposes  to differentiate between  SARS-CoV-2 and other Sarbecovirus currently known to infect humans.  If clinically indicated additional testing with an alternate  test  methodology [phone]) is adv ised. The SARS-CoV-2 RNA is generally  detectable in upper and lower respiratory specimens during the acute  phase of infection. The expected result is Negative. Fact Sheet for Patients:  StrictlyIdeas.no Fact Sheet for Healthcare Providers: BankingDealers.co.za This test is not yet approved or cleared by the [place] FDA and has been authorized for detection and/or diagnosis of SARS-CoV-2 by FDA under an Emergency Use Authorization (EUA).  This EUA will remain in effect (meaning this  test can be used) for the duration of the COVID-19 declaration under Section 564(b)(1) of the Act, 21 U.S.C. section 360bbb-3(b)(1), unless the authorization is terminated or revoked sooner. Performed at [facility], [address], [place], [place] [address]   MRSA PCR Screening     Status: None   Collection Time: 04/12/19  6:56 AM   Specimen: Nasal Mucosa; Nasopharyngeal  Result Value Ref Range Status   MRSA by PCR NEGATIVE NEGATIVE Final    Comment:        The GeneXpert MRSA Assay (FDA approved for NASAL specimens only), is one component of a comprehensive MRSA colonization surveillance program. It is not intended to diagnose MRSA infection nor to guide or monitor treatment for MRSA infections. Performed at [facility], [place] [address] [place], [place] [address]   Expectorated sputum assessment w rflx to resp cult     Status: None   Collection Time: 04/12/19  3:56 PM   Specimen: Tracheal Aspirate; Sputum  Result Value Ref Range Status   Specimen Description TRACHEAL ASPIRATE  Final   Special Requests NONE  Final   Sputum evaluation   Final    THIS SPECIMEN IS ACCEPTABLE FOR SPUTUM CULTURE Performed at [facility], [place] [address] [place], [place] [address]    Report Status 04/12/2019 FINAL  Final  Culture, respiratory     Status: None   Collection Time: 04/12/19   3:56 PM   Specimen: Tracheal Aspirate  Result Value Ref Range Status   Specimen Description   Final    TRACHEAL ASPIRATE Performed at [place] [address] [place], [place] [address]    Special Requests   Final    NONE Reflexed from [id number] Performed at [facility], [place] [address], [place], [place] [address]    Gram Stain   Final    MODERATE WBC PRESENT, PREDOMINANTLY MONONUCLEAR RARE SQUAMOUS EPITHELIAL CELLS PRESENT RARE GRAM POSITIVE COCCI IN CLUSTERS RARE BUDDING YEAST SEEN    Culture   Final    RARE Consistent with normal respiratory flora. Performed at [facility], [place] [address], [place], [place] [address]    Report Status 04/15/2019 FINAL  Final  Culture, respiratory (non-expectorated)     Status: None (Preliminary result)   Collection Time: 04/18/19 11:35 PM   Specimen: Tracheal Aspirate; Respiratory  Result Value Ref Range Status   Specimen Description   Final    TRACHEAL ASPIRATE Performed at [place] [address] [place], [place] [address]    Special Requests   Final    NONE Performed at [facility], [place] [address] [place], [place] [address]    Gram Stain   Final    RARE WBC PRESENT, PREDOMINANTLY PMN RARE SQUAMOUS EPITHELIAL CELLS PRESENT MODERATE GRAM POSITIVE COCCI RARE GRAM NEGATIVE RODS RARE GRAM POSITIVE RODS RARE YEAST Performed at [facility], [place] [address], [place], [place] [address]    Culture PENDING  Incomplete   Report Status PENDING  Incomplete         Radiology Studies: No results found.      Scheduled Meds: . arformoterol  15 mcg Nebulization BID  . aspirin  81 mg Oral Daily  . azithromycin  250 mg Per Tube Daily  . budesonide (PULMICORT) nebulizer solution  0.5 mg Nebulization BID  . chlorhexidine gluconate (MEDLINE KIT)  15 mL Mouth Rinse BID  . Chlorhexidine Gluconate Cloth  6 each Topical  Daily  . clonazePAM  2 mg  Per Tube BID  . clopidogrel  75 mg Per Tube Daily  . feeding supplement (PRO-STAT SUGAR FREE 64)  30 mL Per Tube Daily  . folic acid  1 mg Per Tube Daily  . free water  300 mL Per Tube Q4H  . HYDROmorphone  2 mg Oral Q6H  . insulin regular  0-10 Units Intravenous TID WC  . ipratropium-albuterol  3 mL Nebulization Q6H  . mouth rinse  15 mL Mouth Rinse 10 times per day  . methylPREDNISolone (SOLU-MEDROL) injection  40 mg Intravenous Q12H  . metoprolol tartrate  25 mg Per Tube BID  . multivitamin  15 mL Per Tube Daily  . pantoprazole sodium  40 mg Per Tube Daily   Continuous Infusions: . sodium chloride 75 mL/hr at 04/19/19 0500  . dexmedetomidine (PRECEDEX) IV infusion 0.5 mcg/kg/hr (04/19/19 0706)  . feeding supplement (VITAL AF 1.2 CAL) 1,000 mL (04/18/19 0836)  . heparin 1,050 Units/hr (04/19/19 0500)  . insulin 15.5 Units/hr (04/19/19 0658)  . phenylephrine (NEO-SYNEPHRINE) Adult infusion 60 mcg/min (04/19/19 0707)     LOS: 7 days   The patient is critically ill with multiple organ systems failure and requires high complexity decision making for assessment and support, frequent evaluation and titration of therapies, application of advanced monitoring technologies and extensive interpretation of multiple databases. Critical Care Time devoted to patient care services described in this note  Time spent: 40 minutes    Aiva Miskell, Geraldo Docker, MD [facility] Pager [phone]  If 7PM-7AM, please contact night-coverage [email or web address] Password [facility] 04/19/2019, 7:48 AM

## 2019-04-20 DIAGNOSIS — J1520 Pneumonia due to staphylococcus, unspecified: Secondary | ICD-10-CM | POA: Diagnosis present

## 2019-04-20 DIAGNOSIS — J189 Pneumonia, unspecified organism: Secondary | ICD-10-CM | POA: Diagnosis not present

## 2019-04-20 DIAGNOSIS — N179 Acute kidney failure, unspecified: Secondary | ICD-10-CM

## 2019-04-20 DIAGNOSIS — Z4659 Encounter for fitting and adjustment of other gastrointestinal appliance and device: Secondary | ICD-10-CM

## 2019-04-20 DIAGNOSIS — E870 Hyperosmolality and hypernatremia: Secondary | ICD-10-CM

## 2019-04-20 DIAGNOSIS — Z978 Presence of other specified devices: Secondary | ICD-10-CM

## 2019-04-20 DIAGNOSIS — J15211 Pneumonia due to Methicillin susceptible Staphylococcus aureus: Secondary | ICD-10-CM

## 2019-04-20 DIAGNOSIS — J9601 Acute respiratory failure with hypoxia: Secondary | ICD-10-CM

## 2019-04-20 DIAGNOSIS — J9602 Acute respiratory failure with hypercapnia: Secondary | ICD-10-CM

## 2019-04-20 LAB — BASIC METABOLIC PANEL
Anion gap: 7 (ref 5–15)
BUN: 72 mg/dL — ABNORMAL HIGH (ref 8–23)
CO2: 25 mmol/L (ref 22–32)
Calcium: 8.1 mg/dL — ABNORMAL LOW (ref 8.9–10.3)
Chloride: 118 mmol/L — ABNORMAL HIGH (ref 98–111)
Creatinine, Ser: 1.57 mg/dL — ABNORMAL HIGH (ref 0.61–1.24)
GFR calc Af Amer: 53 mL/min — ABNORMAL LOW (ref >60)
GFR calc non Af Amer: 46 mL/min — ABNORMAL LOW (ref >60)
Glucose, Bld: 246 mg/dL — ABNORMAL HIGH (ref 70–99)
Potassium: 4.6 mmol/L (ref 3.5–5.1)
Sodium: 150 mmol/L — ABNORMAL HIGH (ref 135–145)

## 2019-04-20 LAB — URINE CULTURE: Culture: NO GROWTH

## 2019-04-20 LAB — CBC WITH DIFFERENTIAL/PLATELET
Abs Immature Granulocytes: 0.07 10*3/uL (ref 0.00–0.07)
Basophils Absolute: 0 10*3/uL (ref 0.0–0.1)
Basophils Relative: 0 %
Eosinophils Absolute: 0 10*3/uL (ref 0.0–0.5)
Eosinophils Relative: 0 %
HCT: 43.3 % (ref 39.0–52.0)
Hemoglobin: 13.3 g/dL (ref 13.0–17.0)
Immature Granulocytes: 1 %
Lymphocytes Relative: 13 %
Lymphs Abs: 1.7 10*3/uL (ref 0.7–4.0)
MCH: 29.8 pg (ref 26.0–34.0)
MCHC: 30.7 g/dL (ref 30.0–36.0)
MCV: 97.1 fL (ref 80.0–100.0)
Monocytes Absolute: 0.9 10*3/uL (ref 0.1–1.0)
Monocytes Relative: 7 %
Neutro Abs: 10.1 10*3/uL — ABNORMAL HIGH (ref 1.7–7.7)
Neutrophils Relative %: 79 %
Platelets: 171 10*3/uL (ref 150–400)
RBC: 4.46 MIL/uL (ref 4.22–5.81)
RDW: 17.2 % — ABNORMAL HIGH (ref 11.5–15.5)
WBC: 12.7 10*3/uL — ABNORMAL HIGH (ref 4.0–10.5)
nRBC: 0 % (ref 0.0–0.2)

## 2019-04-20 LAB — COMPREHENSIVE METABOLIC PANEL
ALT: 83 U/L — ABNORMAL HIGH (ref 0–44)
AST: 47 U/L — ABNORMAL HIGH (ref 15–41)
Albumin: 3 g/dL — ABNORMAL LOW (ref 3.5–5.0)
Alkaline Phosphatase: 46 U/L (ref 38–126)
Anion gap: 7 (ref 5–15)
BUN: 79 mg/dL — ABNORMAL HIGH (ref 8–23)
CO2: 27 mmol/L (ref 22–32)
Calcium: 7.9 mg/dL — ABNORMAL LOW (ref 8.9–10.3)
Chloride: 119 mmol/L — ABNORMAL HIGH (ref 98–111)
Creatinine, Ser: 1.47 mg/dL — ABNORMAL HIGH (ref 0.61–1.24)
GFR calc Af Amer: 57 mL/min — ABNORMAL LOW (ref >60)
GFR calc non Af Amer: 49 mL/min — ABNORMAL LOW (ref >60)
Glucose, Bld: 247 mg/dL — ABNORMAL HIGH (ref 70–99)
Potassium: 5 mmol/L (ref 3.5–5.1)
Sodium: 153 mmol/L — ABNORMAL HIGH (ref 135–145)
Total Bilirubin: 0.7 mg/dL (ref 0.3–1.2)
Total Protein: 5.9 g/dL — ABNORMAL LOW (ref 6.5–8.1)

## 2019-04-20 LAB — POCT I-STAT 7, (LYTES, BLD GAS, ICA,H+H)
Acid-base deficit: 1 mmol/L (ref 0.0–2.0)
Bicarbonate: 25.8 mmol/L (ref 20.0–28.0)
Calcium, Ion: 1.21 mmol/L (ref 1.15–1.40)
HCT: 38 % — ABNORMAL LOW (ref 39.0–52.0)
Hemoglobin: 12.9 g/dL — ABNORMAL LOW (ref 13.0–17.0)
O2 Saturation: 91 %
Patient temperature: 36.8
Potassium: 4.8 mmol/L (ref 3.5–5.1)
Sodium: 153 mmol/L — ABNORMAL HIGH (ref 135–145)
TCO2: 27 mmol/L (ref 22–32)
pCO2 arterial: 50.2 mmHg — ABNORMAL HIGH (ref 32.0–48.0)
pH, Arterial: 7.318 — ABNORMAL LOW (ref 7.350–7.450)
pO2, Arterial: 67 mmHg — ABNORMAL LOW (ref 83.0–108.0)

## 2019-04-20 LAB — GLUCOSE, CAPILLARY
Glucose-Capillary: 178 mg/dL — ABNORMAL HIGH (ref 70–99)
Glucose-Capillary: 197 mg/dL — ABNORMAL HIGH (ref 70–99)
Glucose-Capillary: 221 mg/dL — ABNORMAL HIGH (ref 70–99)
Glucose-Capillary: 230 mg/dL — ABNORMAL HIGH (ref 70–99)
Glucose-Capillary: 237 mg/dL — ABNORMAL HIGH (ref 70–99)
Glucose-Capillary: 242 mg/dL — ABNORMAL HIGH (ref 70–99)
Glucose-Capillary: 281 mg/dL — ABNORMAL HIGH (ref 70–99)

## 2019-04-20 LAB — MAGNESIUM: Magnesium: 2.7 mg/dL — ABNORMAL HIGH (ref 1.7–2.4)

## 2019-04-20 LAB — D-DIMER, QUANTITATIVE: D-Dimer, Quant: 2.09 ug/mL-FEU — ABNORMAL HIGH (ref 0.00–0.50)

## 2019-04-20 LAB — C-REACTIVE PROTEIN: CRP: <0.8 mg/dL (ref <1.0)

## 2019-04-20 LAB — HEPARIN LEVEL (UNFRACTIONATED): Heparin Unfractionated: 0.38 IU/mL (ref 0.30–0.70)

## 2019-04-20 MED ORDER — DEXTROSE 5 % IV SOLN
INTRAVENOUS | Status: DC
  Administered 2019-04-20 – 2019-04-22 (×3): via INTRAVENOUS

## 2019-04-20 MED ORDER — FREE WATER
300.0000 mL | Status: DC
  Administered 2019-04-20 – 2019-04-23 (×16): 300 mL

## 2019-04-20 MED ORDER — INSULIN ASPART 100 UNIT/ML ~~LOC~~ SOLN
0.0000 [IU] | SUBCUTANEOUS | Status: DC
  Administered 2019-04-20: 20:00:00 7 [IU] via SUBCUTANEOUS
  Administered 2019-04-20: 4 [IU] via SUBCUTANEOUS
  Administered 2019-04-21: 3 [IU] via SUBCUTANEOUS
  Administered 2019-04-21: 7 [IU] via SUBCUTANEOUS
  Administered 2019-04-21: 3 [IU] via SUBCUTANEOUS
  Administered 2019-04-21 (×3): 4 [IU] via SUBCUTANEOUS
  Administered 2019-04-22 (×2): 7 [IU] via SUBCUTANEOUS
  Administered 2019-04-22 (×3): 4 [IU] via SUBCUTANEOUS
  Administered 2019-04-23 (×2): 7 [IU] via SUBCUTANEOUS
  Administered 2019-04-23: 15 [IU] via SUBCUTANEOUS
  Administered 2019-04-23 (×2): 4 [IU] via SUBCUTANEOUS
  Administered 2019-04-23: 3 [IU] via SUBCUTANEOUS
  Administered 2019-04-23: 09:00:00 4 [IU] via SUBCUTANEOUS
  Administered 2019-04-24: 20 [IU] via SUBCUTANEOUS
  Administered 2019-04-24: 15 [IU] via SUBCUTANEOUS
  Administered 2019-04-24: 08:00:00 11 [IU] via SUBCUTANEOUS
  Administered 2019-04-24: 20 [IU] via SUBCUTANEOUS
  Administered 2019-04-24 – 2019-04-25 (×4): 11 [IU] via SUBCUTANEOUS
  Administered 2019-04-25 (×2): 15 [IU] via SUBCUTANEOUS
  Administered 2019-04-25: 4 [IU] via SUBCUTANEOUS
  Administered 2019-04-25: 11 [IU] via SUBCUTANEOUS
  Administered 2019-04-26: 3 [IU] via SUBCUTANEOUS
  Administered 2019-04-26 (×4): 7 [IU] via SUBCUTANEOUS
  Administered 2019-04-27: 11 [IU] via SUBCUTANEOUS
  Administered 2019-04-27: 7 [IU] via SUBCUTANEOUS
  Administered 2019-04-27: 11 [IU] via SUBCUTANEOUS
  Administered 2019-04-27: 15 [IU] via SUBCUTANEOUS
  Administered 2019-04-27: 7 [IU] via SUBCUTANEOUS
  Administered 2019-04-27: 20 [IU] via SUBCUTANEOUS
  Administered 2019-04-27: 7 [IU] via SUBCUTANEOUS
  Administered 2019-04-28 (×2): 4 [IU] via SUBCUTANEOUS
  Administered 2019-04-28 (×3): 7 [IU] via SUBCUTANEOUS
  Administered 2019-04-29 – 2019-04-30 (×10): 4 [IU] via SUBCUTANEOUS
  Administered 2019-04-30: 05:00:00 7 [IU] via SUBCUTANEOUS
  Administered 2019-04-30 (×2): 4 [IU] via SUBCUTANEOUS
  Administered 2019-05-01 (×3): 3 [IU] via SUBCUTANEOUS

## 2019-04-20 MED ORDER — INSULIN GLARGINE 100 UNIT/ML ~~LOC~~ SOLN
25.0000 [IU] | Freq: Every day | SUBCUTANEOUS | Status: DC
  Administered 2019-04-20 – 2019-04-21 (×2): 25 [IU] via SUBCUTANEOUS
  Filled 2019-04-20 (×2): qty 0.25

## 2019-04-20 MED ORDER — INSULIN ASPART 100 UNIT/ML ~~LOC~~ SOLN
8.0000 [IU] | Freq: Every day | SUBCUTANEOUS | Status: AC
  Administered 2019-04-20 – 2019-04-21 (×4): 8 [IU] via SUBCUTANEOUS

## 2019-04-20 MED ORDER — HYDROMORPHONE HCL 2 MG PO TABS
2.0000 mg | ORAL_TABLET | Freq: Four times a day (QID) | ORAL | Status: DC | PRN
  Administered 2019-04-29 (×2): 2 mg via ORAL
  Filled 2019-04-20 (×2): qty 1

## 2019-04-20 MED ORDER — VANCOMYCIN HCL 1000 MG IV SOLR
INTRAVENOUS | Status: DC
  Filled 2019-04-20 (×8): qty 250

## 2019-04-20 MED ORDER — VANCOMYCIN HCL IN DEXTROSE 1.25-5 GM/250ML-% IV SOLN: 1250.0000 mg | INTRAVENOUS | Status: DC

## 2019-04-20 MED ORDER — VANCOMYCIN HCL 10 G IV SOLR: 1250.0000 mg | INTRAVENOUS | Status: DC

## 2019-04-20 MED ORDER — VANCOMYCIN HCL 1000 MG IV SOLR
INTRAVENOUS | Status: DC
  Administered 2019-04-20 – 2019-04-22 (×3): via INTRAVENOUS
  Filled 2019-04-20 (×3): qty 250

## 2019-04-20 MED ORDER — PRO-STAT SUGAR FREE PO LIQD
30.0000 mL | Freq: Every day | ORAL | Status: DC
  Administered 2019-04-21 – 2019-04-23 (×3): 30 mL
  Filled 2019-04-20 (×3): qty 30

## 2019-04-20 NOTE — Progress Notes (Signed)
Assisted family with FaceTime. Updated them on patient's status and answered all questions.

## 2019-04-20 NOTE — Progress Notes (Signed)
ANTICOAGULATION & ANTIBIOTIC CONSULT NOTE - Follow Up Consult  Pharmacy Consult for heparin Indication: atrial fibrillation  Allergies  Allergen Reactions  . Ivp Dye [Iodinated Diagnostic Agents] Itching and Rash    Patient Measurements: Height: 6' (182.9 cm) Weight: 229 lb 4.5 oz (104 kg) IBW/kg (Calculated) : 77.6 Heparin Dosing Weight: 98.5 kg  Vital Signs: Temp: 98.1 F (36.7 C) (08/09 1200) Temp Source: Esophageal (08/09 1200) BP: 137/68 (08/09 1200) Pulse Rate: 91 (08/09 1200)  Labs: Recent Labs    04/18/19 0420 04/18/19 0426  04/18/19 0631 04/18/19 1147  04/19/19 0515  04/19/19 1254 04/19/19 1820 04/20/19 0422 04/20/19 0515  HGB  --   --    < >  --  14.1  --  14.2  --   --   --  12.9* 13.3  HCT  --   --    < >  --  44.9  --  46.0  --   --   --  38.0* 43.3  PLT  --   --   --   --  184  --  279  --   --   --   --  171  HEPARINUNFRC  --  0.44  --   --   --   --  0.42  --   --   --   --  0.38  CREATININE 1.49*  --   --   --   --    < > 1.70*   < > 1.38* 1.30*  --  1.47*  TROPONINIHS 111*  --   --  118*  --   --   --   --   --   --   --   --    < > = values in this interval not displayed.    Estimated Creatinine Clearance: 62.5 mL/min (A) (by C-G formula based on SCr of 1.47 mg/dL (H)).  Assessment: 68 yo M on IV heparin for new onset Afib. Heparin level is now therapeutic at 0.57 on 1050 units/hr of IV heparin. Hgb stable, plts low but stable. Of note, patient experienced some bleeding near the central line site which has since resolved with thrombi-pad placement.   Heparin level this AM remains therapeutic at 0.38. H/H and Plt wnl.     Goal of Therapy:  Heparin level 0.3-0.7 units/ml Monitor platelets by anticoagulation protocol: Yes   Plan:   Continue IV heparin at 1050 units/hr    Monitor daily heparin level, CBC, s/s of bleed   Albertina Parr, PharmD., BCPS Clinical Pharmacist

## 2019-04-20 NOTE — Progress Notes (Addendum)
NAME:  Robert [name], MRN:  [id number], DOB:  02/05/1951, LOS: 8 ADMISSION DATE:  04/11/2019, CONSULTATION DATE:  8/1 REFERRING MD:  Dr. Roderic Palau, CHIEF COMPLAINT:  dyspnea   Brief History   68 y/o male admitted on 8/1 in the setting of ARDS from COVID-19 pneumonia.  Required intubation and mechanical ventilation at an outside hospital, has high sedative needs  Past Medical History  Anxiety COPD GERD CAD Hypertension Hyperlipidemia Rheumatoid arthritis History of systolic heart failure, however 08/2018 Echo showed LVEF 50-55%, left atrium dilated  Significant Hospital Events   8/1 admission to [facility] ICU 8/2 extubated 8/3 early AM agitated, placed on BIPAP  Consults:  PCCM  Procedures:  8/1 ETT > 8/2, 8/3 >  8/1 L subclavian CVL >   Significant Diagnostic Tests:  08/2018 Echo> LVEF 50-55%, left atrium dilated  Micro Data:  7/31 blood >  7/31 sars-cov-2 > positive  Antimicrobials:  7/31 Cefepime >  7/31 Flagyl > 7/31 7/31 Vanc > 7/31 8/1 remdesivir > 8/1 actemra >  8/1 decadron >    Interim history/subjective:   Weaned off pressors Opens eyes to voice. Follows simple commands.   Objective   Blood pressure 135/63, pulse (!) 103, temperature (!) 97.5 F (36.4 C), resp. rate (!) 25, height 6' (1.829 m), weight 104 kg, SpO2 (!) 89 %. CVP:  [10 mmHg] 10 mmHg  Vent Mode: PRVC FiO2 (%):  [30 %-40 %] 30 % Set Rate:  [26 bmp] 26 bmp Vt Set:  [460 mL] 460 mL PEEP:  [5 cmH20] 5 cmH20 Pressure Support:  [15 cmH20] 15 cmH20 Plateau Pressure:  [17 cmH20-25 cmH20] (P) 17 cmH20   Intake/Output Summary (Last 24 hours) at 04/20/2019 1542 Last data filed at 04/20/2019 1400 Gross per 24 hour  Intake 5200.42 ml  Output 2520 ml  Net 2680.42 ml   Filed Weights   04/17/19 0118 04/19/19 0500 04/20/19 0500  Weight: 106.6 kg 103.3 kg 104 kg   Physical Exam: General: Critically ill-appearing, vented and sedated HENT: [place], AT, ETT in place Eyes: EOMI, no scleral icterus  Respiratory: Clear to auscultation bilaterally.  No crackles, wheezing or rales Cardiovascular: RRR, -M/R/G, no JVD GI: BS+, soft, nontender Extremities: Anasarca Neuro: Opens eyes to voice, sticks out tongue on commands, moves UE, overall profound weakness GU: Condom cath in place  8/8 CXR reviewed personally by me: Improved bilateral airspace disease with improved aeration. No effusion or edema.  Resolved Hospital Problem list     Assessment & Plan:   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: C/b COPD Exacerbation, pulmonary edema Self-extubated and re-intubated within 24 hours Pressure support as tolerated. Extubation precluded by mental status Continue mechanical ventilation per ARDS protocol Target TVol 6-8cc/kgIBW Target Plateau Pressure < 30cm H20 Target driving pressure less than 15 cm of water Target PaO2 55-65: titrate PEEP/FiO2 per protocol Ventilator associated pneumonia prevention protocol Antibiotics as below Hold on diuresis. Resume tomorrow if pressures ok  Septic shock secondary to S. Aureus PNA - shock resolved Continue Vanc and Zosyn F/u final blood and respiratory cultures  COPD with acute exacerbation Complete solumedrol Continue bronchodilators: Brovana and Pulmicort BID, Duonebs TID Antibiotics as above  AKI Monitor UOP/Cr Avoid nephrotoxic agents  Need for sedation/mechanical ventilation RASS target to 0 to -1 Wean Precedex PRN pain medications  VTach Monitor electrolytes K and Mg, goal 4 and 2 respectively Telemetry  Hypernatremia FWF and IVF per TRH Trend BMP  Best practice:  Diet: tube feeding Pain/Anxiety/Delirium protocol (if indicated): as  above VAP protocol (if indicated): yes DVT prophylaxis: lovenox GI prophylaxis: Pantoprazole for stress ulcer prophylaxis Glucose control: SSI Mobility: bed rest Code Status: full Family Communication: Updated daughter and son on 8/8 Disposition: remain in ICU  Labs   CBC: Recent Labs  Lab  04/16/19 0505 04/17/19 0445 04/18/19 0444 04/18/19 1147 04/19/19 0515 04/20/19 0422 04/20/19 0515  WBC 4.3 5.5  --  8.9 17.6*  --  12.7*  NEUTROABS 2.6 3.6  --  7.3 14.8*  --  10.1*  HGB 12.7* 13.5 13.9 14.1 14.2 12.9* 13.3  HCT 40.0 42.2 41.0 44.9 46.0 38.0* 43.3  MCV 92.2 93.0  --  94.1 96.6  --  97.1  PLT 111* 104*  --  184 279  --  537    Basic Metabolic Panel: Recent Labs  Lab 04/13/19 1625  04/14/19 0520  04/17/19 0445  04/18/19 0426  04/19/19 0515 04/19/19 0830 04/19/19 1254 04/19/19 1820 04/20/19 0422 04/20/19 0515  NA  --    < > 143   < > 149*   < >  --    < > 154* 153* 153* 154* 153* 153*  K  --    < > 4.0   < > 4.8   < >  --    < > 4.8 4.9 4.4 4.6 4.8 5.0  CL  --   --  112*   < > 119*   < >  --    < > 122* 123* 123* 122*  --  119*  CO2  --   --  23   < > 23   < >  --    < > 25 24 23 24   --  27  GLUCOSE  --   --  128*   < > 435*   < >  --    < > 303* 242* 191* 110*  --  247*  BUN  --   --  48*   < > 72*   < >  --    < > 93* 85* 81* 76*  --  79*  CREATININE  --   --  1.54*   < > 1.69*   < >  --    < > 1.70* 1.47* 1.38* 1.30*  --  1.47*  CALCIUM  --   --  7.4*   < > 7.3*   < >  --    < > 8.0* 7.8* 7.2* 7.5*  --  7.9*  MG 2.0  --  2.2  --  2.8*  --  2.8*  --  3.0*  --   --   --   --  2.7*  PHOS 3.5  --  3.2  --   --   --   --   --   --   --   --   --   --   --    < > = values in this interval not displayed.   GFR: Estimated Creatinine Clearance: 62.5 mL/min (A) (by C-G formula based on SCr of 1.47 mg/dL (H)). Recent Labs  Lab 04/14/19 1900  04/17/19 0445 04/18/19 1147 04/18/19 2338 04/19/19 0340 04/19/19 0515 04/19/19 1449 04/20/19 0515  PROCALCITON 0.21  --   --   --   --   --   --   --   --   WBC  --    < > 5.5 8.9  --   --  17.6*  --  12.7*  LATICACIDVEN  --   --   --   --  1.6 1.2  --  1.1  --    < > = values in this interval not displayed.    Liver Function Tests: Recent Labs  Lab 04/16/19 0505 04/17/19 0445 04/18/19 0420 04/19/19 0515  04/20/19 0515  AST 73* 82* 44* 45* 47*  ALT 87* 103* 93* 75* 83*  ALKPHOS 56 56 59 52 46  BILITOT 0.3 0.6 0.5 0.4 0.7  PROT 5.7* 5.7* 6.2* 5.7* 5.9*  ALBUMIN 2.2* 2.3* 2.5* 2.5* 3.0*   No results for input(s): LIPASE, AMYLASE in the last 168 hours. No results for input(s): AMMONIA in the last 168 hours.  ABG    Component Value Date/Time   PHART 7.318 (L) 04/20/2019 0422   PCO2ART 50.2 (H) 04/20/2019 0422   PO2ART 67.0 (L) 04/20/2019 0422   HCO3 25.8 04/20/2019 0422   TCO2 27 04/20/2019 0422   ACIDBASEDEF 1.0 04/20/2019 0422   O2SAT 91.0 04/20/2019 0422     Coagulation Profile: No results for input(s): INR, PROTIME in the last 168 hours.  Cardiac Enzymes: No results for input(s): CKTOTAL, CKMB, CKMBINDEX, TROPONINI in the last 168 hours.  HbA1C: Hgb A1c MFr Bld  Date/Time Value Ref Range Status  04/11/2019 09:50 PM 7.4 (H) 4.8 - 5.6 % Final    Comment:    (NOTE) Pre diabetes:          5.7%-6.4% Diabetes:              >6.4% Glycemic control for   <7.0% adults with diabetes   06/07/2017 12:02 AM 9.1 (H) 4.8 - 5.6 % Final    Comment:    (NOTE) Pre diabetes:          5.7%-6.4% Diabetes:              >6.4% Glycemic control for   <7.0% adults with diabetes     CBG: Recent Labs  Lab 04/19/19 2007 04/20/19 0154 04/20/19 0512 04/20/19 0749 04/20/19 1136  GLUCAP 154* 281* 242* 197* 237*     Critical care time: 35 minutes    The patient is critically ill with multiple organ systems failure and requires high complexity decision making for assessment and support, frequent evaluation and titration of therapies, application of advanced monitoring technologies and extensive interpretation of multiple databases.   Discussed and co-managed patient care with PCCM-Hospitalist. Coordinated care with RT, RN and pharmacist.  [name], M.D. [facility] Pulmonary/Critical Care Medicine 04/20/2019 3:43 PM  Pager: [phone] After hours pager: [phone]

## 2019-04-20 NOTE — Progress Notes (Signed)
PROGRESS NOTE    Robert [name]  OJJ:[id number] DOB: 04-14-1951 DOA: 04/11/2019 PCP: Redmond School, MD   Brief Narrative:  68 year old WM PMHx COPD, coronary artery disease, diabetes, ischemic cardiomyopathy, rheumatoid arthritis,   Presents to the hospital with complaints of progressive worsening shortness of breath.  He was mildly febrile and had worsening shortness of breath so he called EMS.  In the ER, he was noted to be substantially hypoxic, had mental status changes.  He tested positive for COVID-19.  He was intubated in the emergency room for persistent hypoxia.  He has been transferred to [facility] for further management.   Subjective: 8/9 intubated and sedated.  Unresponsive to painful stimuli T-max overnight 38.2 C      Assessment & Plan:   Principal Problem:   Pneumonia due to COVID-19 virus Active Problems:   Rheumatoid arthritis ([place])   Hyperlipidemia   COPD (chronic obstructive pulmonary disease) (HCC)   Controlled type 2 diabetes mellitus with diabetic peripheral angiopathy without gangrene, with long-term current use of insulin (HCC)   Essential hypertension   Ischemic cardiomyopathy   Coronary artery disease   Dyslipidemia (high LDL; low HDL)   COVID-19 virus detected   Acute respiratory distress syndrome (ARDS) due to COVID-19 virus   Atrial fibrillation with RVR (HCC)   COPD (chronic obstructive pulmonary disease) with emphysema (HCC)   AKI (acute kidney injury) ([place])   Diabetes mellitus type 2, uncontrolled, with complications (HCC)   Pressure injury of skin  Acute respiratory failure with hypoxia/COVID pneumonia - 8/1 Remdesivir, Actemra, Decadron - Extubated 8/2--> on 8/3 required reintubation - Reintubation was thought secondary to a bacterial superinfection overlaid on his COVID pneumonia -Appropriate antibiotics started see below -Titrate sedating medication down; - 8/6 we will give patient 3-day burst of high-dose steroids.   Solu-Medrol 40 mg BID - Increased secretions, overnight T-max 38.5 C.  Possible superinfection brewing. -8/8 started Z-Pak - 8/9 start Z-Pak--> DC'd see antibiotics below  Sepsis/HCAP positive staph aureus -T-max overnight 38.2 C, while on Z-Pak. - Panculture pending - Change antibiotics to broader spectrum  COPD - See acute respiratory failure  A. fib with RVR - Currently NSR -CHADSVASc score of 5, currently on heparin -Metoprolol 25 mg twice daily - Strict in and out +10.6 L -Daily weight Filed Weights   04/17/19 0118 04/19/19 0500 04/20/19 0500  Weight: 106.6 kg 103.3 kg 104 kg  - Daily CVP  8/9 CVP = 10    Ischemic cardiomyopathy - See A. Fib -Patient had episode of hypotension and was placed on phenyl ephedrine has now been weaned off. - See A. fib RVR  Essential HTN -See A. fib RVR  Hypernatremia - Free water deficit 3 L - 8/9 continue  free water 316m q 4hr Recent Labs  Lab 04/19/19 0830 04/19/19 1254 04/19/19 1820 04/20/19 0422 04/20/19 0515  NA 153* 153* 154* 153* 153*  - 8/9 start D5W 771mhr.  Given patient's free water deficit will need to give less fluid using D5W. - We will need to watch patient's CBG closely  CAD/stents -Aspirin + Plavix  AKI (baseline Cr~0.8) -Mostly multifactorial to include infection, hypotension/hypoperfusion Recent Labs  Lab 04/19/19 0515 04/19/19 0830 04/19/19 1254 04/19/19 1820 04/20/19 0515  CREATININE 1.70* 1.47* 1.38* 1.30* 1.47*  -Slowly improving   Rheumatoid arthritis -Chronically on methotrexate and Actemra  Diabetes type 2 uncontrolled -7/3 1 hemoglobin A1c= 7.4 -8/7 patient placed on glucose stabilizer protocol overnight -8/8 glucose stabilizer protocol discontinued. - Lantus 25  units daily - NovoLog 8 units q 4 hr - Resistant SSI      DVT prophylaxis: Heparin Code Status: Full Family Communication:  Disposition Plan: TBD   Consultants:  PCCM  Procedures/Significant Events:  8/8  PCXR; significant improvement of bilateral patchy opacifications when compared to previous x-ray    I have personally reviewed and interpreted all radiology studies and my findings are as above.  VENTILATOR SETTINGS: Ventilator mode: PRVC Vt Set: 460 Set rate: 26 FiO2: 30 I time: 0.8 PEEP: 5    Cultures 8/7 blood RIGHT AC NGTD 8/7 blood RIGHT hand NGTD 8/7 tracheal aspirate positive staph aureus 8/8 urine negative    Antimicrobials: Anti-infectives (From admission, onward)   Start     Stop   04/20/19 1400  vancomycin (VANCOCIN) 1,250 mg in sodium chloride 0.9 % 250 mL IVPB  Status:  Discontinued     04/20/19 1215   04/20/19 1400  vancomycin (VANCOCIN) 1,250 mg in sodium chloride 0.9 % 250 mL IVPB  Status:  Discontinued     04/20/19 1220   04/20/19 1400  Vancomycin HCl in Dextrose 1.25-5 GM/250ML-% SOLN 1,250 mg  Status:  Discontinued     04/20/19 1221   04/20/19 1400  Vancomycin 1244m in D5W  Status:  Discontinued     04/20/19 1233   04/20/19 1400  Vancomycin 12556min D5W         04/19/19 2200  piperacillin-tazobactam (ZOSYN) IVPB 3.375 g         04/19/19 1430  vancomycin (VANCOCIN) 2,000 mg in sodium chloride 0.9 % 500 mL IVPB     04/19/19 1806   04/19/19 1400  piperacillin-tazobactam (ZOSYN) IVPB 3.375 g     04/19/19 1530   04/19/19 1000  azithromycin (ZITHROMAX) tablet 250 mg  Status:  Discontinued     04/20/19 1655   04/18/19 1300  azithromycin (ZITHROMAX) tablet 500 mg     04/18/19 1351   04/13/19 1000  remdesivir 100 mg in sodium chloride 0.9 % 250 mL IVPB     04/16/19 1103   04/12/19 1000  remdesivir 200 mg in sodium chloride 0.9 % 250 mL IVPB     04/12/19 1317   04/12/19 0600  ceFEPIme (MAXIPIME) 2 g in sodium chloride 0.9 % 100 mL IVPB  Status:  Discontinued     04/15/19 1452   04/12/19 0200  vancomycin (VANCOCIN) IVPB 750 mg/150 ml premix  Status:  Discontinued     04/12/19 1455   04/11/19 2130  ceFEPIme (MAXIPIME) 2 g in sodium chloride 0.9 % 100 mL  IVPB     04/11/19 2257   04/11/19 2130  metroNIDAZOLE (FLAGYL) IVPB 500 mg     04/11/19 2356   04/11/19 2130  vancomycin (VANCOCIN) IVPB 1000 mg/200 mL premix     04/12/19 0105        Devices    LINES / TUBES:  #7.5 cuffed ETT 8/3>>    Continuous Infusions: . dexmedetomidine (PRECEDEX) IV infusion 0.3 mcg/kg/hr (04/20/19 0300)  . dextrose 5 % and 0.45% NaCl 50 mL/hr at 04/20/19 0300  . feeding supplement (VITAL AF 1.2 CAL) Stopped (04/19/19 1600)  . heparin 1,050 Units/hr (04/20/19 0300)  . insulin Stopped (04/19/19 1845)  . phenylephrine (NEO-SYNEPHRINE) Adult infusion Stopped (04/20/19 0248)  . piperacillin-tazobactam (ZOSYN)  IV 3.375 g (04/20/19 0540)  . vancomycin       Objective: Vitals:   04/20/19 0500 04/20/19 0545 04/20/19 0600 04/20/19 0700  BP: (!) 144/75 111/65 98/62 97/62  Pulse: (!) 114 (!) 105 94 83  Resp: (!) 28 (!) 26 (!) 25 (!) 23  Temp: 98.1 F (36.7 C) (!) 97.5 F (36.4 C) 97.7 F (36.5 C) 97.7 F (36.5 C)  TempSrc:      SpO2: 93% 93% 92% 91%  Weight: 104 kg     Height:        Intake/Output Summary (Last 24 hours) at 04/20/2019 0801 Last data filed at 04/20/2019 0300 Gross per 24 hour  Intake 4500.18 ml  Output 2350 ml  Net 2150.18 ml   Filed Weights   04/17/19 0118 04/19/19 0500 04/20/19 0500  Weight: 106.6 kg 103.3 kg 104 kg   Physical Exam:  General: Intubated and sedated positive acute respiratory distress Eyes: negative scleral hemorrhage, negative anisocoria, negative icterus ENT: Negative Runny nose, negative gingival bleeding, #7.5 ETT placed negative sign of infection or bleeding Neck:  Negative scars, masses, torticollis, lymphadenopathy, JVD Lungs: Clear to auscultation bilaterally without wheezes or crackles Cardiovascular: Regular rate and rhythm without murmur gallop or rub normal S1 and S2 Abdomen: negative abdominal pain, nondistended, positive soft, bowel sounds, no rebound, no ascites, no appreciable mass  Extremities: No significant cyanosis, clubbing, or edema bilateral lower extremities Skin: Negative rashes, lesions, ulcers Psychiatric: Unable to evaluate secondary.  Patient being intubated/sedated   Central nervous system: Does not respond to painful stimuli      Data Reviewed: Care during the described time interval was provided by me .  I have reviewed this patient's available data, including medical history, events of note, physical examination, and all test results as part of my evaluation.   CBC: Recent Labs  Lab 04/16/19 0505 04/17/19 0445 04/18/19 0444 04/18/19 1147 04/19/19 0515 04/20/19 0422 04/20/19 0515  WBC 4.3 5.5  --  8.9 17.6*  --  12.7*  NEUTROABS 2.6 3.6  --  7.3 14.8*  --  10.1*  HGB 12.7* 13.5 13.9 14.1 14.2 12.9* 13.3  HCT 40.0 42.2 41.0 44.9 46.0 38.0* 43.3  MCV 92.2 93.0  --  94.1 96.6  --  97.1  PLT 111* 104*  --  184 279  --  314   Basic Metabolic Panel: Recent Labs  Lab 04/13/19 1625  04/14/19 0520  04/17/19 0445  04/18/19 0426  04/19/19 0515 04/19/19 0830 04/19/19 1254 04/19/19 1820 04/20/19 0422 04/20/19 0515  NA  --    < > 143   < > 149*   < >  --    < > 154* 153* 153* 154* 153* 153*  K  --    < > 4.0   < > 4.8   < >  --    < > 4.8 4.9 4.4 4.6 4.8 5.0  CL  --   --  112*   < > 119*   < >  --    < > 122* 123* 123* 122*  --  119*  CO2  --   --  23   < > 23   < >  --    < > _0 --  27  GLUCOSE  --   --  128*   < > 435*   < >  --    < > 303* 242* 191* 110*  --  247*  BUN  --   --  48*   < > 72*   < >  --    < > 93* 85* 81* 76*  --  79*  CREATININE  --   --  1.54*   < > 1.69*   < >  --    < > 1.70* 1.47* 1.38* 1.30*  --  1.47*  CALCIUM  --   --  7.4*   < > 7.3*   < >  --    < > 8.0* 7.8* 7.2* 7.5*  --  7.9*  MG 2.0  --  2.2  --  2.8*  --  2.8*  --  3.0*  --   --   --   --  2.7*  PHOS 3.5  --  3.2  --   --   --   --   --   --   --   --   --   --   --    < > = values in this interval not displayed.   GFR: Estimated Creatinine  Clearance: 62.5 mL/min (A) (by C-G formula based on SCr of 1.47 mg/dL (H)). Liver Function Tests: Recent Labs  Lab 04/16/19 0505 04/17/19 0445 04/18/19 0420 04/19/19 0515 04/20/19 0515  AST 73* 82* 44* 45* 47*  ALT 87* 103* 93* 75* 83*  ALKPHOS 56 56 59 52 46  BILITOT 0.3 0.6 0.5 0.4 0.7  PROT 5.7* 5.7* 6.2* 5.7* 5.9*  ALBUMIN 2.2* 2.3* 2.5* 2.5* 3.0*   No results for input(s): LIPASE, AMYLASE in the last 168 hours. No results for input(s): AMMONIA in the last 168 hours. Coagulation Profile: No results for input(s): INR, PROTIME in the last 168 hours. Cardiac Enzymes: No results for input(s): CKTOTAL, CKMB, CKMBINDEX, TROPONINI in the last 168 hours. BNP (last 3 results) No results for input(s): PROBNP in the last 8760 hours. HbA1C: No results for input(s): HGBA1C in the last 72 hours. CBG: Recent Labs  Lab 04/19/19 1649 04/19/19 2007 04/20/19 0154 04/20/19 0512 04/20/19 0749  GLUCAP 153* 154* 281* 242* 197*   Lipid Profile: No results for input(s): CHOL, HDL, LDLCALC, TRIG, CHOLHDL, LDLDIRECT in the last 72 hours. Thyroid Function Tests: Recent Labs    04/18/19 0420  TSH 0.927   Anemia Panel: No results for input(s): VITAMINB12, FOLATE, FERRITIN, TIBC, IRON, RETICCTPCT in the last 72 hours. Urine analysis:    Component Value Date/Time   COLORURINE YELLOW 04/11/2019 2125   APPEARANCEUR CLEAR 04/11/2019 2125   LABSPEC 1.025 04/11/2019 2125   PHURINE 5.0 04/11/2019 2125   GLUCOSEU 50 (A) 04/11/2019 2125   HGBUR SMALL (A) 04/11/2019 2125   BILIRUBINUR NEGATIVE 04/11/2019 2125   KETONESUR 5 (A) 04/11/2019 2125   PROTEINUR >=300 (A) 04/11/2019 2125   UROBILINOGEN 0.2 12/22/2011 2130   NITRITE NEGATIVE 04/11/2019 2125   LEUKOCYTESUR NEGATIVE 04/11/2019 2125   Sepsis Labs: _0 (procalcitonin:4,lacticidven:4)  ) Recent Results (from the past 240 hour(s))  Novel Coronavirus, NAA ([facility])     Status: Abnormal   Collection Time: 04/11/19  8:21 AM    Specimen: Nasal Swab  Result Value Ref Range Status   SARS-CoV-2, NAA Detected (A) Not Detected Final    Comment: This test was developed and its performance characteristics determined by [employer]. This test has not been FDA cleared or approved. This test has been authorized by FDA under an Emergency Use Authorization (EUA). This test is only authorized for the duration of time the declaration that circumstances exist justifying the authorization of the emergency use of in vitro diagnostic tests for detection of SARS-CoV-2 virus and/or diagnosis of COVID-19 infection under section 564(b)(1) of the Act, 21 U.S.C. 841YSA-6(T)(0), unless the authorization is terminated or revoked sooner. When  diagnostic testing is negative, the possibility of a false negative result should be considered in the context of a patient's recent exposures and the presence of clinical signs and symptoms consistent with COVID-19. An individual without symptoms of COVID-19 and who is not shedding SARS-CoV-2 virus would expect to have a negative (not detected) result in this assay.   Blood Culture (routine x 2)     Status: None   Collection Time: 04/11/19  9:25 PM   Specimen: BLOOD  Result Value Ref Range Status   Specimen Description BLOOD  Final   Special Requests NONE  Final   Culture   Final    NO GROWTH 5 DAYS Performed at [facility], [address], [place], [place] [address]    Report Status 04/16/2019 FINAL  Final  Urine culture     Status: None   Collection Time: 04/11/19  9:25 PM   Specimen: In/Out Cath Urine  Result Value Ref Range Status   Specimen Description   Final    IN/OUT CATH URINE Performed at [facility], [address] [place], [place] [address]    Special Requests   Final    NONE Performed at [facility], [address], [place], [place] [address]    Culture   Final    NO GROWTH Performed at [facility], [place] [address], [place], [place] [address]    Report  Status 04/13/2019 FINAL  Final  Blood Culture (routine x 2)     Status: None   Collection Time: 04/11/19  9:45 PM   Specimen: BLOOD RIGHT ARM  Result Value Ref Range Status   Specimen Description BLOOD RIGHT ARM  Final   Special Requests   Final    BOTTLES DRAWN AEROBIC AND ANAEROBIC Blood Culture adequate volume   Culture   Final    NO GROWTH 5 DAYS Performed at [facility], [address], [place], [place] [address]    Report Status 04/16/2019 FINAL  Final  SARS Coronavirus 2 [facility] order, Performed in [facility] hospital lab) Nasopharyngeal Nasopharyngeal Swab     Status: Abnormal   Collection Time: 04/11/19  9:50 PM   Specimen: Nasopharyngeal Swab  Result Value Ref Range Status   SARS Coronavirus 2 POSITIVE (A) NEGATIVE Final    Comment: RESULT CALLED TO, READ BACK BY AND VERIFIED WITH: TURNER,C. AT 2356 ON 04/11/2019 (NOTE) If result is NEGATIVE SARS-CoV-2 target nucleic acids are NOT DETECTED. The SARS-CoV-2 RNA is generally detectable in upper and lower  respiratory specimens during the acute phase of infection. The lowest  concentration of SARS-CoV-2 viral copies this assay can detect is 250  copies / mL. A negative result does not preclude SARS-CoV-2 infection  and should not be used as the sole basis for treatment or other  patient management decisions.  A negative result may occur with  improper specimen collection / handling, submission of specimen other  than nasopharyngeal swab, presence of viral mutation(s) within the  areas targeted by this assay, and inadequate number of viral copies  (<250 copies / mL). A negative result must be combined with clinical  observations, patient history, and epidemiological information. If result is POSITIVE SARS-CoV-2 target nucleic acids are DETECTED. The S ARS-CoV-2 RNA is generally detectable in upper and lower  respiratory specimens during the acute phase of infection.  Positive  results are indicative of active infection  with SARS-CoV-2.  Clinical  correlation with patient history and other diagnostic information is  necessary to determine patient  infection status.  Positive results do  not rule out bacterial infection or co-infection with other viruses. If result is PRESUMPTIVE POSTIVE SARS-CoV-2 nucleic acids MAY BE PRESENT.   A presumptive positive result was obtained on the submitted specimen  and confirmed on repeat testing.  While 2019 novel coronavirus  (SARS-CoV-2) nucleic acids may be present in the submitted sample  additional confirmatory testing may be necessary for epidemiological  and / or clinical management purposes  to differentiate between  SARS-CoV-2 and other Sarbecovirus currently known to infect humans.  If clinically indicated additional testing with an alternate test  methodology [phone]) is adv ised. The SARS-CoV-2 RNA is generally  detectable in upper and lower respiratory specimens during the acute  phase of infection. The expected result is Negative. Fact Sheet for Patients:  StrictlyIdeas.no Fact Sheet for Healthcare Providers: BankingDealers.co.za This test is not yet approved or cleared by the [place] FDA and has been authorized for detection and/or diagnosis of SARS-CoV-2 by FDA under an Emergency Use Authorization (EUA).  This EUA will remain in effect (meaning this test can be used) for the duration of the COVID-19 declaration under Section 564(b)(1) of the Act, 21 U.S.C. section 360bbb-3(b)(1), unless the authorization is terminated or revoked sooner. Performed at [facility], [address], [place], [place] [address]   MRSA PCR Screening     Status: None   Collection Time: 04/12/19  6:56 AM   Specimen: Nasal Mucosa; Nasopharyngeal  Result Value Ref Range Status   MRSA by PCR NEGATIVE NEGATIVE Final    Comment:        The GeneXpert MRSA Assay (FDA approved for NASAL specimens only), is one component of a  comprehensive MRSA colonization surveillance program. It is not intended to diagnose MRSA infection nor to guide or monitor treatment for MRSA infections. Performed at [facility], [place] [address], [place], [place] [address]   Expectorated sputum assessment w rflx to resp cult     Status: None   Collection Time: 04/12/19  3:56 PM   Specimen: Tracheal Aspirate; Sputum  Result Value Ref Range Status   Specimen Description TRACHEAL ASPIRATE  Final   Special Requests NONE  Final   Sputum evaluation   Final    THIS SPECIMEN IS ACCEPTABLE FOR SPUTUM CULTURE Performed at [facility], [place] [address], [place], [place] [address]    Report Status 04/12/2019 FINAL  Final  Culture, respiratory     Status: None   Collection Time: 04/12/19  3:56 PM   Specimen: Tracheal Aspirate  Result Value Ref Range Status   Specimen Description   Final    TRACHEAL ASPIRATE Performed at [place] [address] [place], [place] [address]    Special Requests   Final    NONE Reflexed from [phone] Performed at [facility], [place] [address] [place], [place] [address]    Gram Stain   Final    MODERATE WBC PRESENT, PREDOMINANTLY MONONUCLEAR RARE SQUAMOUS EPITHELIAL CELLS PRESENT RARE GRAM POSITIVE COCCI IN CLUSTERS RARE BUDDING YEAST SEEN    Culture   Final    RARE Consistent with normal respiratory flora. Performed at [facility], [place] [address], [place], [place] [address]    Report Status 04/15/2019 FINAL  Final  Culture, blood (routine x 2)     Status: None (Preliminary result)   Collection Time: 04/18/19  6:00 PM   Specimen: BLOOD  Result Value Ref Range Status  Specimen Description   Final    BLOOD RIGHT AC Performed at [place] [address], [place], [place] [address]    Special Requests   Final    BOTTLES DRAWN AEROBIC AND ANAEROBIC Blood Culture adequate volume Performed at  [place] [address] [place], [place] [address]    Culture   Final    NO GROWTH < 24 HOURS Performed at [place] [address] [place], [place] [address]    Report Status PENDING  Incomplete  Culture, blood (routine x 2)     Status: None (Preliminary result)   Collection Time: 04/18/19  6:06 PM   Specimen: BLOOD  Result Value Ref Range Status   Specimen Description   Final    BLOOD RIGHT HAND Performed at [place] [address], [place], [place] [address]    Special Requests   Final    BOTTLES DRAWN AEROBIC AND ANAEROBIC Blood Culture adequate volume Performed at [place] [address] [place], [place] [address]    Culture   Final    NO GROWTH < 24 HOURS Performed at [place] [address], [place], [place] [address]    Report Status PENDING  Incomplete  Culture, respiratory (non-expectorated)     Status: None (Preliminary result)   Collection Time: 04/18/19 11:35 PM   Specimen: Tracheal Aspirate; Respiratory  Result Value Ref Range Status   Specimen Description   Final    TRACHEAL ASPIRATE Performed at [place] [address], [place], [place] [address]    Special Requests   Final    NONE Performed at [facility], [place] [address] [place], [place] [address]    Gram Stain   Final    RARE WBC PRESENT, PREDOMINANTLY PMN RARE SQUAMOUS EPITHELIAL CELLS PRESENT MODERATE GRAM POSITIVE COCCI RARE GRAM NEGATIVE RODS RARE GRAM POSITIVE RODS RARE YEAST Performed at [facility], [place] [address] [place], [place] [address]    Culture PENDING  Incomplete   Report Status PENDING  Incomplete         Radiology Studies: Dg Chest Port 1 View  Result Date: 04/19/2019 CLINICAL DATA:  68 year old male with history of fever. EXAM: PORTABLE CHEST 1 VIEW COMPARISON:  Chest x-ray 04/14/2019. FINDINGS: An endotracheal tube is in place  with tip 3.9 cm above the carina. There is a left-sided subclavian central venous catheter with tip terminating in the mid superior vena cava. A feeding tube is seen extending into the abdomen, however, the tip of the feeding tube extends below the lower margin of the image. There continues to be some patchy multifocal interstitial and airspace disease throughout the mid to lower lungs bilaterally (left greater than right), however, aeration has dramatically improved compared to the prior study. Trace left pleural effusion. No right pleural effusion. No evidence of pulmonary edema. Heart size is normal. Upper mediastinal contours are within normal limits. Aortic atherosclerosis. IMPRESSION: 1. Support apparatus, as above. 2. Persistent multilobar pneumonia with marked improved aeration throughout the lungs bilaterally compared to the recent prior study. 3. Trace left pleural effusion. Electronically Signed   By: Vinnie Langton M.D.   On: 04/19/2019 09:18        Scheduled Meds: . arformoterol  15 mcg Nebulization BID  . aspirin  81 mg Oral Daily  . azithromycin  250 mg Per Tube Daily  . budesonide (PULMICORT) nebulizer solution  0.5  mg Nebulization BID  . chlorhexidine gluconate (MEDLINE KIT)  15 mL Mouth Rinse BID  . Chlorhexidine Gluconate Cloth  6 each Topical Daily  . clonazePAM  2 mg Per Tube BID  . clopidogrel  75 mg Per Tube Daily  . feeding supplement (PRO-STAT SUGAR FREE 64)  30 mL Per Tube Daily  . folic acid  1 mg Per Tube Daily  . free water  300 mL Per Tube Q4H  . HYDROmorphone  2 mg Oral Q6H  . insulin aspart  0-20 Units Subcutaneous Q4H  . insulin regular  0-10 Units Intravenous TID WC  . ipratropium-albuterol  3 mL Nebulization Q6H  . mouth rinse  15 mL Mouth Rinse 10 times per day  . metoprolol tartrate  25 mg Per Tube BID  . multivitamin  15 mL Per Tube Daily  . pantoprazole sodium  40 mg Per Tube Daily   Continuous Infusions: . dexmedetomidine (PRECEDEX) IV infusion  0.3 mcg/kg/hr (04/20/19 0300)  . dextrose 5 % and 0.45% NaCl 50 mL/hr at 04/20/19 0300  . feeding supplement (VITAL AF 1.2 CAL) Stopped (04/19/19 1600)  . heparin 1,050 Units/hr (04/20/19 0300)  . insulin Stopped (04/19/19 1845)  . phenylephrine (NEO-SYNEPHRINE) Adult infusion Stopped (04/20/19 0248)  . piperacillin-tazobactam (ZOSYN)  IV 3.375 g (04/20/19 0540)  . vancomycin       LOS: 8 days   The patient is critically ill with multiple organ systems failure and requires high complexity decision making for assessment and support, frequent evaluation and titration of therapies, application of advanced monitoring technologies and extensive interpretation of multiple databases. Critical Care Time devoted to patient care services described in this note  Time spent: 40 minutes    , Geraldo Docker, MD [facility] Pager [phone]  If 7PM-7AM, please contact night-coverage [email or web address] Password [email or web address] 04/20/2019, 8:01 AM

## 2019-04-21 ENCOUNTER — Inpatient Hospital Stay (HOSPITAL_COMMUNITY): Payer: Medicare HMO

## 2019-04-21 DIAGNOSIS — J1520 Pneumonia due to staphylococcus, unspecified: Secondary | ICD-10-CM

## 2019-04-21 DIAGNOSIS — M05712 Rheumatoid arthritis with rheumatoid factor of left shoulder without organ or systems involvement: Secondary | ICD-10-CM

## 2019-04-21 LAB — POCT I-STAT 7, (LYTES, BLD GAS, ICA,H+H)
Bicarbonate: 26.6 mmol/L (ref 20.0–28.0)
Calcium, Ion: 1.27 mmol/L (ref 1.15–1.40)
HCT: 36 % — ABNORMAL LOW (ref 39.0–52.0)
Hemoglobin: 12.2 g/dL — ABNORMAL LOW (ref 13.0–17.0)
O2 Saturation: 88 %
Patient temperature: 98.2
Potassium: 4.6 mmol/L (ref 3.5–5.1)
Sodium: 149 mmol/L — ABNORMAL HIGH (ref 135–145)
TCO2: 28 mmol/L (ref 22–32)
pCO2 arterial: 48.3 mmHg — ABNORMAL HIGH (ref 32.0–48.0)
pH, Arterial: 7.348 — ABNORMAL LOW (ref 7.350–7.450)
pO2, Arterial: 58 mmHg — ABNORMAL LOW (ref 83.0–108.0)

## 2019-04-21 LAB — COMPREHENSIVE METABOLIC PANEL
ALT: 92 U/L — ABNORMAL HIGH (ref 0–44)
AST: 42 U/L — ABNORMAL HIGH (ref 15–41)
Albumin: 2.8 g/dL — ABNORMAL LOW (ref 3.5–5.0)
Alkaline Phosphatase: 58 U/L (ref 38–126)
Anion gap: 7 (ref 5–15)
BUN: 67 mg/dL — ABNORMAL HIGH (ref 8–23)
CO2: 26 mmol/L (ref 22–32)
Calcium: 8.1 mg/dL — ABNORMAL LOW (ref 8.9–10.3)
Chloride: 114 mmol/L — ABNORMAL HIGH (ref 98–111)
Creatinine, Ser: 1.47 mg/dL — ABNORMAL HIGH (ref 0.61–1.24)
GFR calc Af Amer: 57 mL/min — ABNORMAL LOW (ref >60)
GFR calc non Af Amer: 49 mL/min — ABNORMAL LOW (ref >60)
Glucose, Bld: 173 mg/dL — ABNORMAL HIGH (ref 70–99)
Potassium: 4.3 mmol/L (ref 3.5–5.1)
Sodium: 147 mmol/L — ABNORMAL HIGH (ref 135–145)
Total Bilirubin: 0.6 mg/dL (ref 0.3–1.2)
Total Protein: 5.7 g/dL — ABNORMAL LOW (ref 6.5–8.1)

## 2019-04-21 LAB — CBC WITH DIFFERENTIAL/PLATELET
Abs Immature Granulocytes: 0.05 10*3/uL (ref 0.00–0.07)
Basophils Absolute: 0.1 10*3/uL (ref 0.0–0.1)
Basophils Relative: 0 %
Eosinophils Absolute: 0.2 10*3/uL (ref 0.0–0.5)
Eosinophils Relative: 1 %
HCT: 43.2 % (ref 39.0–52.0)
Hemoglobin: 13 g/dL (ref 13.0–17.0)
Immature Granulocytes: 0 %
Lymphocytes Relative: 9 %
Lymphs Abs: 1.2 10*3/uL (ref 0.7–4.0)
MCH: 29.3 pg (ref 26.0–34.0)
MCHC: 30.1 g/dL (ref 30.0–36.0)
MCV: 97.3 fL (ref 80.0–100.0)
Monocytes Absolute: 0.6 10*3/uL (ref 0.1–1.0)
Monocytes Relative: 5 %
Neutro Abs: 10.8 10*3/uL — ABNORMAL HIGH (ref 1.7–7.7)
Neutrophils Relative %: 85 %
Platelets: 162 10*3/uL (ref 150–400)
RBC: 4.44 MIL/uL (ref 4.22–5.81)
RDW: 17.3 % — ABNORMAL HIGH (ref 11.5–15.5)
WBC: 12.8 10*3/uL — ABNORMAL HIGH (ref 4.0–10.5)
nRBC: 0 % (ref 0.0–0.2)

## 2019-04-21 LAB — CULTURE, RESPIRATORY W GRAM STAIN

## 2019-04-21 LAB — GLUCOSE, CAPILLARY
Glucose-Capillary: 154 mg/dL — ABNORMAL HIGH (ref 70–99)
Glucose-Capillary: 189 mg/dL — ABNORMAL HIGH (ref 70–99)
Glucose-Capillary: 223 mg/dL — ABNORMAL HIGH (ref 70–99)
Glucose-Capillary: 197 mg/dL — ABNORMAL HIGH (ref 70–99)
Glucose-Capillary: 171 mg/dL — ABNORMAL HIGH (ref 70–99)
Glucose-Capillary: 140 mg/dL — ABNORMAL HIGH (ref 70–99)

## 2019-04-21 LAB — HEPARIN LEVEL (UNFRACTIONATED)
Heparin Unfractionated: 0.16 IU/mL — ABNORMAL LOW (ref 0.30–0.70)
Heparin Unfractionated: 0.22 IU/mL — ABNORMAL LOW (ref 0.30–0.70)
Heparin Unfractionated: 0.27 IU/mL — ABNORMAL LOW (ref 0.30–0.70)

## 2019-04-21 LAB — C-REACTIVE PROTEIN: CRP: <0.8 mg/dL (ref <1.0)

## 2019-04-21 LAB — MAGNESIUM: Magnesium: 2.7 mg/dL — ABNORMAL HIGH (ref 1.7–2.4)

## 2019-04-21 LAB — D-DIMER, QUANTITATIVE: D-Dimer, Quant: 3.19 ug/mL-FEU — ABNORMAL HIGH (ref 0.00–0.50)

## 2019-04-21 MED ORDER — INSULIN GLARGINE 100 UNIT/ML ~~LOC~~ SOLN
32.0000 [IU] | Freq: Every day | SUBCUTANEOUS | Status: DC
  Administered 2019-04-22 – 2019-04-24 (×3): 32 [IU] via SUBCUTANEOUS
  Filled 2019-04-21 (×3): qty 0.32

## 2019-04-21 MED ORDER — INSULIN GLARGINE 100 UNIT/ML ~~LOC~~ SOLN
7.0000 [IU] | Freq: Once | SUBCUTANEOUS | Status: AC
  Administered 2019-04-21: 20:00:00 7 [IU] via SUBCUTANEOUS
  Filled 2019-04-21: qty 0.07

## 2019-04-21 MED ORDER — HEPARIN (PORCINE) 25000 UT/250ML-% IV SOLN: 1500.0000 [IU]/h | INTRAVENOUS | Status: DC

## 2019-04-21 MED ORDER — HEPARIN (PORCINE) 25000 UT/250ML-% IV SOLN
1300.0000 [IU]/h | INTRAVENOUS | Status: DC
  Administered 2019-04-21: 1300 [IU]/h via INTRAVENOUS
  Filled 2019-04-21: qty 250

## 2019-04-21 NOTE — Progress Notes (Signed)
NAME:  Robert [name], MRN:  [id number], DOB:  1950-11-27, LOS: 9 ADMISSION DATE:  04/11/2019, CONSULTATION DATE:  8/1 REFERRING MD:  Dr. Roderic Palau, CHIEF COMPLAINT:  dyspnea   Brief History   68 y/o male admitted on 8/1 in the setting of ARDS from COVID-19 pneumonia.  Required intubation and mechanical ventilation at an outside hospital, has high sedative needs  Past Medical History  Anxiety COPD GERD CAD Hypertension Hyperlipidemia Rheumatoid arthritis History of systolic heart failure, however 08/2018 Echo showed LVEF 50-55%, left atrium dilated  Significant Hospital Events   8/1 admission to [facility] ICU 8/2 extubated 8/3 early AM agitated, placed on BIPAP  Consults:  PCCM  Procedures:  8/1 ETT > 8/2, 8/3 >  8/1 L subclavian CVL >   Significant Diagnostic Tests:  08/2018 Echo> LVEF 50-55%, left atrium dilated  Micro Data:  7/31 blood >  7/31 sars-cov-2 > positive  Antimicrobials:  7/31 Cefepime >  7/31 Flagyl > 7/31 7/31 Vanc > 7/31 8/1 remdesivir > 8/1 actemra >  8/1 decadron >    Interim history/subjective:   Off pressors since yesterday. Eight beats of NSVT, resolved without intervention. No sedation required overnight.  Objective   Blood pressure (!) 146/76, pulse 92, temperature 98.6 F (37 C), resp. rate (!) 30, height 6' (1.829 m), weight 104.5 kg, SpO2 95 %.    Vent Mode: PRVC FiO2 (%):  [30 %-40 %] 40 % Set Rate:  [26 bmp] 26 bmp Vt Set:  [460 mL] 460 mL PEEP:  [5 cmH20] 5 cmH20 Plateau Pressure:  [21 cmH20-24 cmH20] 21 cmH20   Intake/Output Summary (Last 24 hours) at 04/21/2019 1622 Last data filed at 04/21/2019 1400 Gross per 24 hour  Intake 2540.16 ml  Output 1710 ml  Net 830.16 ml   Filed Weights   04/19/19 0500 04/20/19 0500 04/21/19 0500  Weight: 103.3 kg 104 kg 104.5 kg   Physical Exam: General: Critically-appearing, on mechanically vented HENT: , AT, ETT in place Eyes: EOMI, no scleral icterus Respiratory: Clear to auscultation  bilaterally.  No crackles, wheezing or rales Cardiovascular: RRR, -M/R/G, no JVD GI: BS+, soft, nontender Extremities: Anasarca Neuro: Opens eyes to voice, PERRL, sticks out tongue with right-sided deviation GU: Condom cath in place  Resolved Hospital Problem list   Septic shock 2/2 S. Aureus pna  Assessment & Plan:   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: C/b COPD Exacerbation, pulmonary edema Self-extubated and re-intubated within 24 hours Pressure support as tolerated. Extubation precluded by mental status Continue mechanical ventilation per ARDS protocol Target TVol 6-8cc/kgIBW Target Plateau Pressure < 30cm H20 Target driving pressure less than 15 cm of water Target PaO2 55-65: titrate PEEP/FiO2 per protocol Ventilator associated pneumonia prevention protocol Antibiotics as below 8/10: Reviewed ABG. Increased FIO2  S. Aureus PNA  Discontinue Zosyn Continue Vanc F/u final blood and respiratory cultures  COPD with acute exacerbation S/p solumedrol Continue bronchodilators: Brovana and Pulmicort BID, Duonebs TID Antibiotics as above  AKI Monitor UOP/Cr Avoid nephrotoxic agents  Acute encephalopathy: off sedation Discontinue klonopin PRN pain medications for agitation STAT CT head  Non-sustained VT Monitor electrolytes K and Mg, goal 4 and 2 respectively Telemetry  Hypernatremia FWF and IVF per TRH Trend BMP  Best practice:  Diet: tube feeding Pain/Anxiety/Delirium protocol (if indicated): as above VAP protocol (if indicated): yes DVT prophylaxis: lovenox GI prophylaxis: Pantoprazole for stress ulcer prophylaxis Glucose control: SSI Mobility: bed rest Code Status: full Family Communication: Updated daughter and son on 8/8. Called 8/10 with  no answer Disposition: remain in ICU  Labs   CBC: Recent Labs  Lab 04/17/19 0445  04/18/19 1147 04/19/19 0515 04/20/19 0422 04/20/19 0515 04/21/19 0500 04/21/19 0835  WBC 5.5  --  8.9 17.6*  --  12.7* 12.8*  --    NEUTROABS 3.6  --  7.3 14.8*  --  10.1* 10.8*  --   HGB 13.5   < > 14.1 14.2 12.9* 13.3 13.0 12.2*  HCT 42.2   < > 44.9 46.0 38.0* 43.3 43.2 36.0*  MCV 93.0  --  94.1 96.6  --  97.1 97.3  --   PLT 104*  --  184 279  --  171 162  --    < > = values in this interval not displayed.    Basic Metabolic Panel: Recent Labs  Lab 04/17/19 0445  04/18/19 0426  04/19/19 0515  04/19/19 1254 04/19/19 1820 04/20/19 0422 04/20/19 0515 04/20/19 2000 04/21/19 0500 04/21/19 0835  NA 149*   < >  --    < > 154*   < > 153* 154* 153* 153* 150* 147* 149*  K 4.8   < >  --    < > 4.8   < > 4.4 4.6 4.8 5.0 4.6 4.3 4.6  CL 119*   < >  --    < > 122*   < > 123* 122*  --  119* 118* 114*  --   CO2 23   < >  --    < > 25   < > 23 24  --  27 25 26   --   GLUCOSE 435*   < >  --    < > 303*   < > 191* 110*  --  247* 246* 173*  --   BUN 72*   < >  --    < > 93*   < > 81* 76*  --  79* 72* 67*  --   CREATININE 1.69*   < >  --    < > 1.70*   < > 1.38* 1.30*  --  1.47* 1.57* 1.47*  --   CALCIUM 7.3*   < >  --    < > 8.0*   < > 7.2* 7.5*  --  7.9* 8.1* 8.1*  --   MG 2.8*  --  2.8*  --  3.0*  --   --   --   --  2.7*  --  2.7*  --    < > = values in this interval not displayed.   GFR: Estimated Creatinine Clearance: 62.6 mL/min (A) (by C-G formula based on SCr of 1.47 mg/dL (H)). Recent Labs  Lab 04/14/19 1900  04/18/19 1147 04/18/19 2338 04/19/19 0340 04/19/19 0515 04/19/19 1449 04/20/19 0515 04/21/19 0500  PROCALCITON 0.21  --   --   --   --   --   --   --   --   WBC  --    < > 8.9  --   --  17.6*  --  12.7* 12.8*  LATICACIDVEN  --   --   --  1.6 1.2  --  1.1  --   --    < > = values in this interval not displayed.    Liver Function Tests: Recent Labs  Lab 04/17/19 0445 04/18/19 0420 04/19/19 0515 04/20/19 0515 04/21/19 0500  AST 82* 44* 45* 47* 42*  ALT 103* 93* 75* 83* 92*  ALKPHOS 56 59  52 46 58  BILITOT 0.6 0.5 0.4 0.7 0.6  PROT 5.7* 6.2* 5.7* 5.9* 5.7*  ALBUMIN 2.3* 2.5* 2.5* 3.0* 2.8*    No results for input(s): LIPASE, AMYLASE in the last 168 hours. No results for input(s): AMMONIA in the last 168 hours.  ABG    Component Value Date/Time   PHART 7.348 (L) 04/21/2019 0835   PCO2ART 48.3 (H) 04/21/2019 0835   PO2ART 58.0 (L) 04/21/2019 0835   HCO3 26.6 04/21/2019 0835   TCO2 28 04/21/2019 0835   ACIDBASEDEF 1.0 04/20/2019 0422   O2SAT 88.0 04/21/2019 0835     Coagulation Profile: No results for input(s): INR, PROTIME in the last 168 hours.  Cardiac Enzymes: No results for input(s): CKTOTAL, CKMB, CKMBINDEX, TROPONINI in the last 168 hours.  HbA1C: Hgb A1c MFr Bld  Date/Time Value Ref Range Status  04/11/2019 09:50 PM 7.4 (H) 4.8 - 5.6 % Final    Comment:    (NOTE) Pre diabetes:          5.7%-6.4% Diabetes:              >6.4% Glycemic control for   <7.0% adults with diabetes   06/07/2017 12:02 AM 9.1 (H) 4.8 - 5.6 % Final    Comment:    (NOTE) Pre diabetes:          5.7%-6.4% Diabetes:              >6.4% Glycemic control for   <7.0% adults with diabetes     CBG: Recent Labs  Lab 04/20/19 1954 04/20/19 2343 04/21/19 0408 04/21/19 0805 04/21/19 1221  GLUCAP 221* 178* 154* 189* 223*     Critical care time: 32 minutes    The patient is critically ill with multiple organ systems failure and requires high complexity decision making for assessment and support, frequent evaluation and titration of therapies, application of advanced monitoring technologies and extensive interpretation of multiple databases.   Discussed and co-managed patient care with PCCM-Hospitalist. Coordinated care with RT, RN and pharmacist.  [name], M.D. [facility] Pulmonary/Critical Care Medicine 04/21/2019 4:22 PM  Pager: [phone] After hours pager: [phone]

## 2019-04-21 NOTE — Progress Notes (Signed)
ANTICOAGULATION & ANTIBIOTIC CONSULT NOTE - Follow Up Consult  Pharmacy Consult for heparin Indication: atrial fibrillation  Allergies  Allergen Reactions  . Ivp Dye [Iodinated Diagnostic Agents] Itching and Rash    Patient Measurements: Height: 6' (182.9 cm) Weight: 230 lb 6.1 oz (104.5 kg) IBW/kg (Calculated) : 77.6 Heparin Dosing Weight: 98.5 kg  Vital Signs: Temp: 98.6 F (37 C) (08/10 1100) Temp Source: Esophageal (08/10 0400) BP: 146/76 (08/10 1110) Pulse Rate: 92 (08/10 1100)  Labs: Recent Labs    04/19/19 0515  04/20/19 0515 04/20/19 2000 04/21/19 0500 04/21/19 0835  HGB 14.2   < > 13.3  --  13.0 12.2*  HCT 46.0   < > 43.3  --  43.2 36.0*  PLT 279  --  171  --  162  --   HEPARINUNFRC 0.42  --  0.38  --  0.16*  --   CREATININE 1.70*   < > 1.47* 1.57* 1.47*  --    < > = values in this interval not displayed.    Estimated Creatinine Clearance: 62.6 mL/min (A) (by C-G formula based on SCr of 1.47 mg/dL (H)).  Assessment: 68 yo M on IV heparin for new onset Afib. Heparin level is now therapeutic at 0.57 on 1050 units/hr of IV heparin. Hgb stable, plts low but stable. Of note, patient experienced some bleeding near the central line site which has since resolved with thrombi-pad placement.   Today, 04/21/19  HL 0.22, increased but remains subtherapeutic on heparin at 1300 units/hr  CBC:  Hgb remains low/stable.  Plt decreased to 162.  No line issues per RN.  Central line site bleeding is resolved, but RN states that an old peripheral line site on the left forearm is oozing and requires dressing changes.  RN to contact MD about applying a thrombipad.  Continue to monitor closely for bleeding.  Goal of Therapy:  Heparin level 0.3-0.7 units/ml Monitor platelets by anticoagulation protocol: Yes   Plan:   Increase IV heparin to 1500 units/hr    Obtain HL 6 hours after rate change   Monitor daily heparin level, CBC, s/s of bleed   Gretta Arab PharmD,  BCPS Clinical pharmacist phone 7am- 5pm: [phone] 04/21/2019 3:18 PM

## 2019-04-21 NOTE — Progress Notes (Signed)
PROGRESS NOTE    Robert [name]  LEX:[id number] DOB: [birth year] DOA: 04/11/2019 PCP: Redmond School, MD   Brief Narrative:  68 year old WM PMHx COPD, coronary artery disease, diabetes, ischemic cardiomyopathy, rheumatoid arthritis,   Presents to the hospital with complaints of progressive worsening shortness of breath.  He was mildly febrile and had worsening shortness of breath so he called EMS.  In the ER, he was noted to be substantially hypoxic, had mental status changes.  He tested positive for COVID-19.  He was intubated in the emergency room for persistent hypoxia.  He has been transferred to [facility] for further management.   Subjective: 8/10 afebrile overnight intubated and sedated.    Assessment & Plan:   Principal Problem:   Pneumonia due to COVID-19 virus Active Problems:   Rheumatoid arthritis ([place])   Hyperlipidemia   COPD (chronic obstructive pulmonary disease) (HCC)   Controlled type 2 diabetes mellitus with diabetic peripheral angiopathy without gangrene, with long-term current use of insulin (HCC)   Essential hypertension   Ischemic cardiomyopathy   Coronary artery disease   Dyslipidemia (high LDL; low HDL)   COVID-19 virus detected   Acute respiratory distress syndrome (ARDS) due to COVID-19 virus   Atrial fibrillation with RVR (HCC)   COPD (chronic obstructive pulmonary disease) with emphysema (HCC)   AKI (acute kidney injury) ([place])   Diabetes mellitus type 2, uncontrolled, with complications (HCC)   Pressure injury of skin   Staphylococcal pneumonia ([place])   HCAP (healthcare-associated pneumonia)  Acute respiratory failure with hypoxia/COVID pneumonia - 8/1 Remdesivir, Actemra, Decadron - Extubated 8/2--> on 8/3 required reintubation - Reintubation was thought secondary to a bacterial superinfection overlaid on his COVID pneumonia -Appropriate antibiotics started see below -Titrate sedating medication down; - 8/6 we will give patient 3-day  burst of high-dose steroids.  Solu-Medrol 40 mg BID - 8/8 start Z-Pak--> DC'd see antibiotics below.  Empirically started secondary to fever and increased secretions -Brovana twice daily - Pulmicort twice daily -DuoNeb QID  Sepsis/HCAP positive MRSA -Afebrile overnight. - Blood culture NGTD - Continue broad-spectrum antibiotic, patient appears to be defervesce ing.  Afebrile overnight Recent Labs  Lab 04/17/19 0445 04/18/19 1147 04/19/19 0515 04/20/19 0515 04/21/19 0500  WBC 5.5 8.9 17.6* 12.7* 12.8*   COPD - See acute respiratory failure  A. fib with RVR - Currently NSR -CHADSVASc score of 5, currently on heparin -Metoprolol 25 mg twice daily - Strict in and out +12.4 L -Daily weight Filed Weights   04/19/19 0500 04/20/19 0500 04/21/19 0500  Weight: 103.3 kg 104 kg 104.5 kg  - Daily CVP  8/10 CVP = ??   Ischemic cardiomyopathy - See A. Fib -Patient had episode of hypotension and was placed on phenyl ephedrine has now been weaned off. - See A. fib RVR  Essential HTN -See A. fib RVR  Hypernatremia - Free water deficit 3 L - 8/9 continue  free water 322m q 4hr Recent Labs  Lab 04/20/19 0422 04/20/19 0515 04/20/19 2000 04/21/19 0500 04/21/19 0835  NA 153* 153* 150* 147* 149*  - 8/9 start D5W 775mhr.  Given patient's free water deficit will need to give less fluid using D5W. - We will need to watch patient's CBG closely  CAD/stents -Aspirin + Plavix  AKI (baseline Cr~0.8) -Mostly multifactorial to include infection, hypotension/hypoperfusion Recent Labs  Lab 04/19/19 1254 04/19/19 1820 04/20/19 0515 04/20/19 2000 04/21/19 0500  CREATININE 1.38* 1.30* 1.47* 1.57* 1.47*  -Slowly improving   Rheumatoid arthritis -Chronically on methotrexate  and Actemra  Diabetes type 2 uncontrolled -7/3 1 hemoglobin A1c= 7.4 -8/7 patient placed on glucose stabilizer protocol overnight -8/8 glucose stabilizer protocol discontinued. - 8/10 increase Lantus 32  units daily  - NovoLog 8 units q 4 hr - Resistant SSI      DVT prophylaxis: Heparin Code Status: Full Family Communication:  Disposition Plan: TBD   Consultants:  PCCM  Procedures/Significant Events:  8/8 PCXR; significant improvement of bilateral patchy opacifications when compared to previous x-ray    I have personally reviewed and interpreted all radiology studies and my findings are as above.  VENTILATOR SETTINGS: Ventilator mode: PRVC Vt Set: 460 Set rate: 24 FiO2: 40 I time: 0.8 PEEP: 5    Cultures 8/7 blood RIGHT AC NGTD 8/7 blood RIGHT hand NGTD 8/7 tracheal aspirate positive MRSA 8/8 urine negative    Antimicrobials: Anti-infectives (From admission, onward)   Start     Stop   04/20/19 1400  vancomycin (VANCOCIN) 1,250 mg in sodium chloride 0.9 % 250 mL IVPB  Status:  Discontinued     04/20/19 1215   04/20/19 1400  vancomycin (VANCOCIN) 1,250 mg in sodium chloride 0.9 % 250 mL IVPB  Status:  Discontinued     04/20/19 1220   04/20/19 1400  Vancomycin HCl in Dextrose 1.25-5 GM/250ML-% SOLN 1,250 mg  Status:  Discontinued     04/20/19 1221   04/20/19 1400  Vancomycin 1226m in D5W  Status:  Discontinued     04/20/19 1233   04/20/19 1400  Vancomycin 12565min D5W         04/19/19 2200  piperacillin-tazobactam (ZOSYN) IVPB 3.375 g         04/19/19 1430  vancomycin (VANCOCIN) 2,000 mg in sodium chloride 0.9 % 500 mL IVPB     04/19/19 1806   04/19/19 1400  piperacillin-tazobactam (ZOSYN) IVPB 3.375 g     04/19/19 1530   04/19/19 1000  azithromycin (ZITHROMAX) tablet 250 mg  Status:  Discontinued     04/20/19 1655   04/18/19 1300  azithromycin (ZITHROMAX) tablet 500 mg     04/18/19 1351   04/13/19 1000  remdesivir 100 mg in sodium chloride 0.9 % 250 mL IVPB     04/16/19 1103   04/12/19 1000  remdesivir 200 mg in sodium chloride 0.9 % 250 mL IVPB     04/12/19 1317   04/12/19 0600  ceFEPIme (MAXIPIME) 2 g in sodium chloride 0.9 % 100 mL IVPB  Status:   Discontinued     04/15/19 1452   04/12/19 0200  vancomycin (VANCOCIN) IVPB 750 mg/150 ml premix  Status:  Discontinued     04/12/19 1455   04/11/19 2130  ceFEPIme (MAXIPIME) 2 g in sodium chloride 0.9 % 100 mL IVPB     04/11/19 2257   04/11/19 2130  metroNIDAZOLE (FLAGYL) IVPB 500 mg     04/11/19 2356   04/11/19 2130  vancomycin (VANCOCIN) IVPB 1000 mg/200 mL premix     04/12/19 0105        Devices    LINES / TUBES:  #7.5 cuffed ETT 8/3>>    Continuous Infusions: . dextrose 75 mL/hr at 04/21/19 0700  . feeding supplement (VITAL AF 1.2 CAL) Stopped (04/19/19 1600)  . heparin 1,300 Units/hr (04/21/19 0655)  . phenylephrine (NEO-SYNEPHRINE) Adult infusion Stopped (04/20/19 0248)  . piperacillin-tazobactam (ZOSYN)  IV 12.5 mL/hr at 04/21/19 0700  . Vancomycin 125053mn D5W Stopped (04/20/19 1605)     Objective: Vitals:   04/21/19 0500  04/21/19 0600 04/21/19 0700 04/21/19 0752  BP: 137/82 (!) 148/79 (!) 153/81 (!) 155/80  Pulse: (!) 108 (!) 109 (!) 110   Resp: (!) 29 (!) 30 (!) 28 (!) 31  Temp: (!) 97.3 F (36.3 C) (!) 97.3 F (36.3 C)    TempSrc:      SpO2: 90% (!) 89% 91% (!) 89%  Weight: 104.5 kg     Height:        Intake/Output Summary (Last 24 hours) at 04/21/2019 1029 Last data filed at 04/21/2019 0800 Gross per 24 hour  Intake 3512.26 ml  Output 1695 ml  Net 1817.26 ml   Filed Weights   04/19/19 0500 04/20/19 0500 04/21/19 0500  Weight: 103.3 kg 104 kg 104.5 kg   Physical Exam:  General: Intubated and sedated positive acute respiratory distress Eyes: negative scleral hemorrhage, negative anisocoria, negative icterus ENT: Negative Runny nose, negative gingival bleeding, #7.5 ETT in place negative sign of infection or bleeding, tongue deviated to RIGHT Neck:  Negative scars, masses, torticollis, lymphadenopathy, JVD Lungs: Tachypneic, clear to auscultation bilaterally without wheezes or crackles Cardiovascular: Tachycardic without murmur gallop or rub  normal S1 and S2 Abdomen: negative abdominal pain, nondistended, positive soft, bowel sounds, no rebound, no ascites, no appreciable mass Extremities: No significant cyanosis, clubbing, or edema bilateral lower extremities Skin: Negative rashes, lesions, ulcers Psychiatric: Unable to evaluate secondary to intubation  Central nervous system: Does not respond to painful stimuli     Data Reviewed: Care during the described time interval was provided by me .  I have reviewed this patient's available data, including medical history, events of note, physical examination, and all test results as part of my evaluation.   CBC: Recent Labs  Lab 04/17/19 0445  04/18/19 1147 04/19/19 0515 04/20/19 0422 04/20/19 0515 04/21/19 0500 04/21/19 0835  WBC 5.5  --  8.9 17.6*  --  12.7* 12.8*  --   NEUTROABS 3.6  --  7.3 14.8*  --  10.1* 10.8*  --   HGB 13.5   < > 14.1 14.2 12.9* 13.3 13.0 12.2*  HCT 42.2   < > 44.9 46.0 38.0* 43.3 43.2 36.0*  MCV 93.0  --  94.1 96.6  --  97.1 97.3  --   PLT 104*  --  184 279  --  171 162  --    < > = values in this interval not displayed.   Basic Metabolic Panel: Recent Labs  Lab 04/17/19 0445  04/18/19 0426  04/19/19 0515  04/19/19 1254 04/19/19 1820 04/20/19 0422 04/20/19 0515 04/20/19 2000 04/21/19 0500 04/21/19 0835  NA 149*   < >  --    < > 154*   < > 153* 154* 153* 153* 150* 147* 149*  K 4.8   < >  --    < > 4.8   < > 4.4 4.6 4.8 5.0 4.6 4.3 4.6  CL 119*   < >  --    < > 122*   < > 123* 122*  --  119* 118* 114*  --   CO2 23   < >  --    < > 25   < > 23 24  --  _0 --   GLUCOSE 435*   < >  --    < > 303*   < > 191* 110*  --  247* 246* 173*  --   BUN 72*   < >  --    < >  93*   < > 81* 76*  --  79* 72* 67*  --   CREATININE 1.69*   < >  --    < > 1.70*   < > 1.38* 1.30*  --  1.47* 1.57* 1.47*  --   CALCIUM 7.3*   < >  --    < > 8.0*   < > 7.2* 7.5*  --  7.9* 8.1* 8.1*  --   MG 2.8*  --  2.8*  --  3.0*  --   --   --   --  2.7*  --  2.7*  --     < > = values in this interval not displayed.   GFR: Estimated Creatinine Clearance: 62.6 mL/min (A) (by C-G formula based on SCr of 1.47 mg/dL (H)). Liver Function Tests: Recent Labs  Lab 04/17/19 0445 04/18/19 0420 04/19/19 0515 04/20/19 0515 04/21/19 0500  AST 82* 44* 45* 47* 42*  ALT 103* 93* 75* 83* 92*  ALKPHOS 56 59 52 46 58  BILITOT 0.6 0.5 0.4 0.7 0.6  PROT 5.7* 6.2* 5.7* 5.9* 5.7*  ALBUMIN 2.3* 2.5* 2.5* 3.0* 2.8*   No results for input(s): LIPASE, AMYLASE in the last 168 hours. No results for input(s): AMMONIA in the last 168 hours. Coagulation Profile: No results for input(s): INR, PROTIME in the last 168 hours. Cardiac Enzymes: No results for input(s): CKTOTAL, CKMB, CKMBINDEX, TROPONINI in the last 168 hours. BNP (last 3 results) No results for input(s): PROBNP in the last 8760 hours. HbA1C: No results for input(s): HGBA1C in the last 72 hours. CBG: Recent Labs  Lab 04/20/19 1601 04/20/19 1954 04/20/19 2343 04/21/19 0408 04/21/19 0805  GLUCAP 230* 221* 178* 154* 189*   Lipid Profile: No results for input(s): CHOL, HDL, LDLCALC, TRIG, CHOLHDL, LDLDIRECT in the last 72 hours. Thyroid Function Tests: No results for input(s): TSH, T4TOTAL, FREET4, T3FREE, THYROIDAB in the last 72 hours. Anemia Panel: No results for input(s): VITAMINB12, FOLATE, FERRITIN, TIBC, IRON, RETICCTPCT in the last 72 hours. Urine analysis:    Component Value Date/Time   COLORURINE YELLOW 04/11/2019 2125   APPEARANCEUR CLEAR 04/11/2019 2125   LABSPEC 1.025 04/11/2019 2125   PHURINE 5.0 04/11/2019 2125   GLUCOSEU 50 (A) 04/11/2019 2125   HGBUR SMALL (A) 04/11/2019 2125   BILIRUBINUR NEGATIVE 04/11/2019 2125   KETONESUR 5 (A) 04/11/2019 2125   PROTEINUR >=300 (A) 04/11/2019 2125   UROBILINOGEN 0.2 12/22/2011 2130   NITRITE NEGATIVE 04/11/2019 2125   LEUKOCYTESUR NEGATIVE 04/11/2019 2125   Sepsis Labs: _0 (procalcitonin:4,lacticidven:4)  ) Recent Results (from the  past 240 hour(s))  Blood Culture (routine x 2)     Status: None   Collection Time: 04/11/19  9:25 PM   Specimen: BLOOD  Result Value Ref Range Status   Specimen Description BLOOD  Final   Special Requests NONE  Final   Culture   Final    NO GROWTH 5 DAYS Performed at [facility], [address] [place], [place] [address]    Report Status 04/16/2019 FINAL  Final  Urine culture     Status: None   Collection Time: 04/11/19  9:25 PM   Specimen: In/Out Cath Urine  Result Value Ref Range Status   Specimen Description   Final    IN/OUT CATH URINE Performed at [facility], [address], [place], [place] [address]    Special Requests   Final    NONE Performed at [facility], [address], [place], [place] [address]  Culture   Final    NO GROWTH Performed at [facility], [place] [address] [place], [place] [address]    Report Status 04/13/2019 FINAL  Final  Blood Culture (routine x 2)     Status: None   Collection Time: 04/11/19  9:45 PM   Specimen: BLOOD RIGHT ARM  Result Value Ref Range Status   Specimen Description BLOOD RIGHT ARM  Final   Special Requests   Final    BOTTLES DRAWN AEROBIC AND ANAEROBIC Blood Culture adequate volume   Culture   Final    NO GROWTH 5 DAYS Performed at [facility], [address], [place], [place] [address]    Report Status 04/16/2019 FINAL  Final  SARS Coronavirus 2 [facility] order, Performed in [facility] hospital lab) Nasopharyngeal Nasopharyngeal Swab     Status: Abnormal   Collection Time: 04/11/19  9:50 PM   Specimen: Nasopharyngeal Swab  Result Value Ref Range Status   SARS Coronavirus 2 POSITIVE (A) NEGATIVE Final    Comment: RESULT CALLED TO, READ BACK BY AND VERIFIED WITH: TURNER,C. AT 2356 ON 04/11/2019 (NOTE) If result is NEGATIVE SARS-CoV-2 target nucleic acids are NOT DETECTED. The SARS-CoV-2 RNA is generally detectable in upper and lower  respiratory specimens during the acute phase of infection. The lowest   concentration of SARS-CoV-2 viral copies this assay can detect is 250  copies / mL. A negative result does not preclude SARS-CoV-2 infection  and should not be used as the sole basis for treatment or other  patient management decisions.  A negative result may occur with  improper specimen collection / handling, submission of specimen other  than nasopharyngeal swab, presence of viral mutation(s) within the  areas targeted by this assay, and inadequate number of viral copies  (<250 copies / mL). A negative result must be combined with clinical  observations, patient history, and epidemiological information. If result is POSITIVE SARS-CoV-2 target nucleic acids are DETECTED. The S ARS-CoV-2 RNA is generally detectable in upper and lower  respiratory specimens during the acute phase of infection.  Positive  results are indicative of active infection with SARS-CoV-2.  Clinical  correlation with patient history and other diagnostic information is  necessary to determine patient infection status.  Positive results do  not rule out bacterial infection or co-infection with other viruses. If result is PRESUMPTIVE POSTIVE SARS-CoV-2 nucleic acids MAY BE PRESENT.   A presumptive positive result was obtained on the submitted specimen  and confirmed on repeat testing.  While 2019 novel coronavirus  (SARS-CoV-2) nucleic acids may be present in the submitted sample  additional confirmatory testing may be necessary for epidemiological  and / or clinical management purposes  to differentiate between  SARS-CoV-2 and other Sarbecovirus currently known to infect humans.  If clinically indicated additional testing with an alternate test  methodology [phone]) is adv ised. The SARS-CoV-2 RNA is generally  detectable in upper and lower respiratory specimens during the acute  phase of infection. The expected result is Negative. Fact Sheet for Patients:  StrictlyIdeas.no Fact Sheet  for Healthcare Providers: BankingDealers.co.za This test is not yet approved or cleared by the [place] FDA and has been authorized for detection and/or diagnosis of SARS-CoV-2 by FDA under an Emergency Use Authorization (EUA).  This EUA will remain in effect (meaning this test can be used) for the duration of the COVID-19 declaration under Section 564(b)(1) of the Act, 21 U.S.C. section 360bbb-3(b)(1), unless the authorization is terminated or revoked sooner. Performed  at [facility], [address], [place], [place] [address]   MRSA PCR Screening     Status: None   Collection Time: 04/12/19  6:56 AM   Specimen: Nasal Mucosa; Nasopharyngeal  Result Value Ref Range Status   MRSA by PCR NEGATIVE NEGATIVE Final    Comment:        The GeneXpert MRSA Assay (FDA approved for NASAL specimens only), is one component of a comprehensive MRSA colonization surveillance program. It is not intended to diagnose MRSA infection nor to guide or monitor treatment for MRSA infections. Performed at [facility], [place] [address] [place], [place] [address]   Expectorated sputum assessment w rflx to resp cult     Status: None   Collection Time: 04/12/19  3:56 PM   Specimen: Tracheal Aspirate; Sputum  Result Value Ref Range Status   Specimen Description TRACHEAL ASPIRATE  Final   Special Requests NONE  Final   Sputum evaluation   Final    THIS SPECIMEN IS ACCEPTABLE FOR SPUTUM CULTURE Performed at [facility], [place] [address], [place], [place] [address]    Report Status 04/12/2019 FINAL  Final  Culture, respiratory     Status: None   Collection Time: 04/12/19  3:56 PM   Specimen: Tracheal Aspirate  Result Value Ref Range Status   Specimen Description   Final    TRACHEAL ASPIRATE Performed at [facility] [address] [place], [place] [address]    Special Requests   Final    NONE Reflexed from [phone]  Performed at [facility], [place] [address], [place], [place] [address]    Gram Stain   Final    MODERATE WBC PRESENT, PREDOMINANTLY MONONUCLEAR RARE SQUAMOUS EPITHELIAL CELLS PRESENT RARE GRAM POSITIVE COCCI IN CLUSTERS RARE BUDDING YEAST SEEN    Culture   Final    RARE Consistent with normal respiratory flora. Performed at [facility], [place] [address] [place], [place] [address]    Report Status 04/15/2019 FINAL  Final  Culture, blood (routine x 2)     Status: None (Preliminary result)   Collection Time: 04/18/19  6:00 PM   Specimen: BLOOD  Result Value Ref Range Status   Specimen Description   Final    BLOOD RIGHT AC Performed at [facility] [address], [place], [place] [address]    Special Requests   Final    BOTTLES DRAWN AEROBIC AND ANAEROBIC Blood Culture adequate volume Performed at [place] [address] [place], [place] [address]    Culture   Final    NO GROWTH 2 DAYS Performed at [facility] [address] [place], [place] [address]    Report Status PENDING  Incomplete  Culture, blood (routine x 2)     Status: None (Preliminary result)   Collection Time: 04/18/19  6:06 PM   Specimen: BLOOD  Result Value Ref Range Status   Specimen Description   Final    BLOOD RIGHT HAND Performed at [facility] [address], [place], [place] [address]    Special Requests   Final    BOTTLES DRAWN AEROBIC AND ANAEROBIC Blood Culture adequate volume Performed at [facility] [address] [place], [place] [address]    Culture   Final    NO GROWTH 2 DAYS Performed at [facility] [address] [place],  [address]    Report Status  PENDING  Incomplete  Culture, respiratory (non-expectorated)     Status: None   Collection Time: 04/18/19 11:35 PM   Specimen: Tracheal Aspirate; Respiratory  Result Value Ref Range Status   Specimen  Description   Final    TRACHEAL ASPIRATE Performed at [facility] [place] [address], [place], [place] [address]    Special Requests   Final    NONE Performed at [facility], [place] [address] [place], [place] [address]    Gram Stain   Final    RARE WBC PRESENT, PREDOMINANTLY PMN RARE SQUAMOUS EPITHELIAL CELLS PRESENT MODERATE GRAM POSITIVE COCCI RARE GRAM NEGATIVE RODS RARE GRAM POSITIVE RODS RARE YEAST Performed at [facility], [place] [address], [place], [place] [address]    Culture   Final    MODERATE METHICILLIN RESISTANT STAPHYLOCOCCUS AUREUS   Report Status 04/21/2019 FINAL  Final   Organism ID, Bacteria METHICILLIN RESISTANT STAPHYLOCOCCUS AUREUS  Final      Susceptibility   Methicillin resistant staphylococcus aureus - MIC*    CIPROFLOXACIN <=0.5 SENSITIVE Sensitive     ERYTHROMYCIN >=8 RESISTANT Resistant     GENTAMICIN <=0.5 SENSITIVE Sensitive     OXACILLIN >=4 RESISTANT Resistant     TETRACYCLINE <=1 SENSITIVE Sensitive     VANCOMYCIN 1 SENSITIVE Sensitive     TRIMETH/SULFA <=10 SENSITIVE Sensitive     CLINDAMYCIN <=0.25 SENSITIVE Sensitive     RIFAMPIN <=0.5 SENSITIVE Sensitive     Inducible Clindamycin NEGATIVE Sensitive     * MODERATE METHICILLIN RESISTANT STAPHYLOCOCCUS AUREUS  Culture, Urine     Status: None   Collection Time: 04/19/19  4:30 PM   Specimen: Urine, Random  Result Value Ref Range Status   Specimen Description   Final    URINE, RANDOM Performed at [facility] [address], [place], [place] [address]    Special Requests   Final    NONE Performed at [facility], [place] [address] [place], [place] [address]    Culture   Final    NO GROWTH Performed at [facility], [place] [address], [place], [place] [address]    Report Status 04/20/2019 FINAL  Final         Radiology Studies: No results found.      Scheduled Meds: . arformoterol  15 mcg  Nebulization BID  . aspirin  81 mg Oral Daily  . budesonide (PULMICORT) nebulizer solution  0.5 mg Nebulization BID  . chlorhexidine gluconate (MEDLINE KIT)  15 mL Mouth Rinse BID  . Chlorhexidine Gluconate Cloth  6 each Topical Daily  . clonazePAM  2 mg Per Tube BID  . clopidogrel  75 mg Per Tube Daily  . feeding supplement (PRO-STAT SUGAR FREE 64)  30 mL Per Tube Daily  . folic acid  1 mg Per Tube Daily  . free water  300 mL Per Tube Q4H  . insulin aspart  0-20 Units Subcutaneous Q4H  . insulin glargine  25 Units Subcutaneous Daily  . ipratropium-albuterol  3 mL Nebulization Q6H  . mouth rinse  15 mL Mouth Rinse 10 times per day  . metoprolol tartrate  25 mg Per Tube BID  . multivitamin  15 mL Per Tube Daily  . pantoprazole sodium  40 mg Per Tube Daily   Continuous Infusions: . dextrose 75 mL/hr at 04/21/19 0700  . feeding supplement (VITAL AF 1.2 CAL) Stopped (04/19/19 1600)  . heparin 1,300 Units/hr (04/21/19 0655)  .  phenylephrine (NEO-SYNEPHRINE) Adult infusion Stopped (04/20/19 0248)  . piperacillin-tazobactam (ZOSYN)  IV 12.5 mL/hr at 04/21/19 0700  . Vancomycin 1224m in D5W Stopped (04/20/19 1605)     LOS: 9 days   The patient is critically ill with multiple organ systems failure and requires high complexity decision making for assessment and support, frequent evaluation and titration of therapies, application of advanced monitoring technologies and extensive interpretation of multiple databases. Critical Care Time devoted to patient care services described in this note  Time spent: 40 minutes    [place], CGeraldo Docker MD [facility] Pager [phone] If 7PM-7AM, please contact night-coverage [email or web address] Password [email or web address]8/06/2019, 10:29 AM

## 2019-04-21 NOTE — Progress Notes (Signed)
Pt's daughter called to unit. Updated of pt condition. Pt's daughter appreciative of update.

## 2019-04-21 NOTE — Progress Notes (Signed)
ANTICOAGULATION & ANTIBIOTIC CONSULT NOTE - Follow Up Consult  Pharmacy Consult for heparin Indication: atrial fibrillation  Allergies  Allergen Reactions  . Ivp Dye [Iodinated Diagnostic Agents] Itching and Rash    Patient Measurements: Height: 6' (182.9 cm) Weight: 230 lb 6.1 oz (104.5 kg) IBW/kg (Calculated) : 77.6 Heparin Dosing Weight: 98.5 kg  Vital Signs: Temp: 97.3 F (36.3 C) (08/10 0600) Temp Source: Esophageal (08/10 0400) BP: 148/79 (08/10 0600) Pulse Rate: 109 (08/10 0600)  Labs: Recent Labs    04/19/19 0515  04/20/19 0422 04/20/19 0515 04/20/19 2000 04/21/19 0500  HGB 14.2  --  12.9* 13.3  --  13.0  HCT 46.0  --  38.0* 43.3  --  43.2  PLT 279  --   --  171  --  162  HEPARINUNFRC 0.42  --   --  0.38  --  0.16*  CREATININE 1.70*   < >  --  1.47* 1.57* 1.47*   < > = values in this interval not displayed.    Estimated Creatinine Clearance: 62.6 mL/min (A) (by C-G formula based on SCr of 1.47 mg/dL (H)).  Assessment: 68 yo M on IV heparin for new onset Afib. Heparin level is now therapeutic at 0.57 on 1050 units/hr of IV heparin. Hgb stable, plts low but stable. Of note, patient experienced some bleeding near the central line site which has since resolved with thrombi-pad placement.   Today, 04/21/19  HL 0.16, subtherapeutic  Hgb 13, plt 162  No line or bleeding issues per RN     Goal of Therapy:  Heparin level 0.3-0.7 units/ml Monitor platelets by anticoagulation protocol: Yes   Plan:   Increase IV heparin to 1300 units/hr    Obtain HL 6 hours after rate change   Monitor daily heparin level, CBC, s/s of bleed    Royetta Asal, PharmD, BCPS 04/21/2019 6:51 AM

## 2019-04-22 ENCOUNTER — Inpatient Hospital Stay (HOSPITAL_COMMUNITY): Payer: Medicare HMO

## 2019-04-22 DIAGNOSIS — J15212 Pneumonia due to Methicillin resistant Staphylococcus aureus: Secondary | ICD-10-CM

## 2019-04-22 LAB — CBC WITH DIFFERENTIAL/PLATELET
Abs Immature Granulocytes: 0.14 10*3/uL — ABNORMAL HIGH (ref 0.00–0.07)
Basophils Absolute: 0.1 10*3/uL (ref 0.0–0.1)
Basophils Relative: 0 %
Eosinophils Absolute: 0.2 10*3/uL (ref 0.0–0.5)
Eosinophils Relative: 1 %
HCT: 40.4 % (ref 39.0–52.0)
Hemoglobin: 12.2 g/dL — ABNORMAL LOW (ref 13.0–17.0)
Immature Granulocytes: 1 %
Lymphocytes Relative: 7 %
Lymphs Abs: 1.1 10*3/uL (ref 0.7–4.0)
MCH: 29.8 pg (ref 26.0–34.0)
MCHC: 30.2 g/dL (ref 30.0–36.0)
MCV: 98.5 fL (ref 80.0–100.0)
Monocytes Absolute: 0.7 10*3/uL (ref 0.1–1.0)
Monocytes Relative: 4 %
Neutro Abs: 13 10*3/uL — ABNORMAL HIGH (ref 1.7–7.7)
Neutrophils Relative %: 87 %
Platelets: 133 10*3/uL — ABNORMAL LOW (ref 150–400)
RBC: 4.1 MIL/uL — ABNORMAL LOW (ref 4.22–5.81)
RDW: 17.2 % — ABNORMAL HIGH (ref 11.5–15.5)
WBC: 15.2 10*3/uL — ABNORMAL HIGH (ref 4.0–10.5)
nRBC: 0 % (ref 0.0–0.2)

## 2019-04-22 LAB — COMPREHENSIVE METABOLIC PANEL
ALT: 67 U/L — ABNORMAL HIGH (ref 0–44)
AST: 27 U/L (ref 15–41)
Albumin: 2.7 g/dL — ABNORMAL LOW (ref 3.5–5.0)
Alkaline Phosphatase: 66 U/L (ref 38–126)
Anion gap: 7 (ref 5–15)
BUN: 54 mg/dL — ABNORMAL HIGH (ref 8–23)
CO2: 27 mmol/L (ref 22–32)
Calcium: 8.1 mg/dL — ABNORMAL LOW (ref 8.9–10.3)
Chloride: 111 mmol/L (ref 98–111)
Creatinine, Ser: 1.15 mg/dL (ref 0.61–1.24)
GFR calc Af Amer: >60 mL/min (ref >60)
GFR calc non Af Amer: >60 mL/min (ref >60)
Glucose, Bld: 195 mg/dL — ABNORMAL HIGH (ref 70–99)
Potassium: 4.4 mmol/L (ref 3.5–5.1)
Sodium: 145 mmol/L (ref 135–145)
Total Bilirubin: 0.7 mg/dL (ref 0.3–1.2)
Total Protein: 5.7 g/dL — ABNORMAL LOW (ref 6.5–8.1)

## 2019-04-22 LAB — MAGNESIUM: Magnesium: 2.8 mg/dL — ABNORMAL HIGH (ref 1.7–2.4)

## 2019-04-22 LAB — HEPARIN LEVEL (UNFRACTIONATED): Heparin Unfractionated: 0.23 IU/mL — ABNORMAL LOW (ref 0.30–0.70)

## 2019-04-22 LAB — GLUCOSE, CAPILLARY
Glucose-Capillary: 161 mg/dL — ABNORMAL HIGH (ref 70–99)
Glucose-Capillary: 193 mg/dL — ABNORMAL HIGH (ref 70–99)
Glucose-Capillary: 218 mg/dL — ABNORMAL HIGH (ref 70–99)
Glucose-Capillary: 205 mg/dL — ABNORMAL HIGH (ref 70–99)
Glucose-Capillary: 195 mg/dL — ABNORMAL HIGH (ref 70–99)

## 2019-04-22 LAB — C-REACTIVE PROTEIN: CRP: <0.8 mg/dL (ref <1.0)

## 2019-04-22 LAB — D-DIMER, QUANTITATIVE: D-Dimer, Quant: 2.18 ug/mL-FEU — ABNORMAL HIGH (ref 0.00–0.50)

## 2019-04-22 MED ORDER — VANCOMYCIN HCL 1000 MG IV SOLR
Freq: Two times a day (BID) | INTRAVENOUS | Status: DC
  Administered 2019-04-22 – 2019-04-23 (×2): via INTRAVENOUS
  Filled 2019-04-22 (×4): qty 250

## 2019-04-22 MED ORDER — HEPARIN (PORCINE) 25000 UT/250ML-% IV SOLN
1650.0000 [IU]/h | INTRAVENOUS | Status: DC
  Administered 2019-04-22: 1650 [IU]/h via INTRAVENOUS
  Filled 2019-04-22: qty 250

## 2019-04-22 MED ORDER — FUROSEMIDE 10 MG/ML IJ SOLN
40.0000 mg | Freq: Once | INTRAMUSCULAR | Status: AC
  Administered 2019-04-22: 40 mg via INTRAVENOUS
  Filled 2019-04-22: qty 4

## 2019-04-22 MED ORDER — ENOXAPARIN SODIUM 100 MG/ML ~~LOC~~ SOLN
100.0000 mg | Freq: Two times a day (BID) | SUBCUTANEOUS | Status: DC
  Administered 2019-04-22 – 2019-04-23 (×3): 100 mg via SUBCUTANEOUS
  Filled 2019-04-22 (×5): qty 1

## 2019-04-22 NOTE — Progress Notes (Signed)
PROGRESS NOTE    Robert [name]  ENI:[id number] DOB: Dec 21, 1950 DOA: 04/11/2019 PCP: Redmond School, MD   Brief Narrative:  68 year old WM PMHx COPD, coronary artery disease, diabetes, ischemic cardiomyopathy, rheumatoid arthritis,   Presents to the hospital with complaints of progressive worsening shortness of breath.  He was mildly febrile and had worsening shortness of breath so he called EMS.  In the ER, he was noted to be substantially hypoxic, had mental status changes.  He tested positive for COVID-19.  He was intubated in the emergency room for persistent hypoxia.  He has been transferred to [facility] for further management.   Subjective: 8/11 afebrile overnight stuck his tongue out to command    Assessment & Plan:   Principal Problem:   Pneumonia due to COVID-19 virus Active Problems:   Rheumatoid arthritis ([place])   Hyperlipidemia   COPD (chronic obstructive pulmonary disease) (HCC)   Controlled type 2 diabetes mellitus with diabetic peripheral angiopathy without gangrene, with long-term current use of insulin (HCC)   Essential hypertension   Ischemic cardiomyopathy   Coronary artery disease   Dyslipidemia (high LDL; low HDL)   COVID-19 virus detected   Acute respiratory distress syndrome (ARDS) due to COVID-19 virus   Atrial fibrillation with RVR (HCC)   COPD (chronic obstructive pulmonary disease) with emphysema (HCC)   AKI (acute kidney injury) ([place])   Diabetes mellitus type 2, uncontrolled, with complications (HCC)   Pressure injury of skin   Staphylococcal pneumonia ([place])   HCAP (healthcare-associated pneumonia)  Acute respiratory failure with hypoxia/COVID pneumonia - 8/1 Remdesivir, Actemra, Decadron - Extubated 8/2--> on 8/3 required reintubation - Reintubation was thought secondary to a bacterial superinfection overlaid on his COVID pneumonia -Appropriate antibiotics started see below -Titrate sedating medication down; - 8/6 we will give  patient 3-day burst of high-dose steroids.  Solu-Medrol 40 mg BID - 8/8 start Z-Pak--> DC'd see antibiotics below.  Empirically started secondary to fever and increased secretions -Brovana twice daily - Pulmicort twice daily -DuoNeb QID  Sepsis/HCAP positive MRSA -Afebrile overnight. - Blood culture NGTD - Continue broad-spectrum antibiotic, patient appears to be defervesce ing.  Afebrile overnight Recent Labs  Lab 04/18/19 1147 04/19/19 0515 04/20/19 0515 04/21/19 0500 04/22/19 0503  WBC 8.9 17.6* 12.7* 12.8* 15.2*   COPD - See acute respiratory failure  A. fib with RVR - Currently NSR -CHADSVASc score of 5, currently on heparin -Metoprolol 25 mg twice daily - Strict in and out +11.9 L -Daily weight Filed Weights   04/19/19 0500 04/20/19 0500 04/21/19 0500  Weight: 103.3 kg 104 kg 104.5 kg  - Daily CVP  8/10 CVP = ??   Ischemic cardiomyopathy - See A. Fib -Patient had episode of hypotension and was placed on phenyl ephedrine has now been weaned off. - See A. fib RVR  Essential HTN -See A. fib RVR  Hypernatremia - Free water deficit 3 L - 8/9 continue  free water 364m q 4hr Recent Labs  Lab 04/20/19 0515 04/20/19 2000 04/21/19 0500 04/21/19 0835 04/22/19 0503  NA 153* 150* 147* 149* 145  - 8/9 start D5W 718mhr.  Given patient's free water deficit will need to give less fluid using D5W. - 8/11 discontinue D5W: Hypernatremia resolved  CAD/stents -Aspirin + Plavix  AKI (baseline Cr~0.8) -Mostly multifactorial to include infection, hypotension/hypoperfusion Recent Labs  Lab 04/19/19 1820 04/20/19 0515 04/20/19 2000 04/21/19 0500 04/22/19 0503  CREATININE 1.30* 1.47* 1.57* 1.47* 1.15  -Slowly improving   Rheumatoid arthritis -Chronically on methotrexate  and Actemra  Diabetes type 2 uncontrolled -7/3 1 hemoglobin A1c= 7.4 -8/7 patient placed on glucose stabilizer protocol overnight -8/8 glucose stabilizer protocol discontinued. - 8/10 increase  Lantus 32 units daily  -Resistant SSI      DVT prophylaxis: Heparin Code Status: Full Family Communication:  Disposition Plan: TBD   Consultants:  PCCM  Procedures/Significant Events:  8/8 PCXR; significant improvement of bilateral patchy opacifications when compared to previous x-ray    I have personally reviewed and interpreted all radiology studies and my findings are as above.  VENTILATOR SETTINGS: Ventilator mode: PRVC Vt Set: 460 Set rate: 26 FiO2: 35% I time: 0.8 PEEP: 5    Cultures 8/7 blood RIGHT AC NGTD 8/7 blood RIGHT hand NGTD 8/7 tracheal aspirate positive MRSA 8/8 urine negative    Antimicrobials: Anti-infectives (From admission, onward)   Start     Stop   04/22/19 2200  dextrose 5 % 250 mL with vancomycin (VANCOCIN) 1,250 mg infusion     04/26/19 0959   04/20/19 1400  vancomycin (VANCOCIN) 1,250 mg in sodium chloride 0.9 % 250 mL IVPB  Status:  Discontinued     04/20/19 1215   04/20/19 1400  vancomycin (VANCOCIN) 1,250 mg in sodium chloride 0.9 % 250 mL IVPB  Status:  Discontinued     04/20/19 1220   04/20/19 1400  Vancomycin HCl in Dextrose 1.25-5 GM/250ML-% SOLN 1,250 mg  Status:  Discontinued     04/20/19 1221   04/20/19 1400  Vancomycin '1250mg'$  in D5W  Status:  Discontinued     04/20/19 1233   04/20/19 1400  Vancomycin '1250mg'$  in D5W  Status:  Discontinued     04/22/19 1324   04/19/19 2200  piperacillin-tazobactam (ZOSYN) IVPB 3.375 g  Status:  Discontinued     04/21/19 1533   04/19/19 1430  vancomycin (VANCOCIN) 2,000 mg in sodium chloride 0.9 % 500 mL IVPB     04/19/19 1806   04/19/19 1400  piperacillin-tazobactam (ZOSYN) IVPB 3.375 g     04/19/19 1530   04/19/19 1000  azithromycin (ZITHROMAX) tablet 250 mg  Status:  Discontinued     04/20/19 1655   04/18/19 1300  azithromycin (ZITHROMAX) tablet 500 mg     04/18/19 1351   04/13/19 1000  remdesivir 100 mg in sodium chloride 0.9 % 250 mL IVPB     04/16/19 1103   04/12/19 1000   remdesivir 200 mg in sodium chloride 0.9 % 250 mL IVPB     04/12/19 1317   04/12/19 0600  ceFEPIme (MAXIPIME) 2 g in sodium chloride 0.9 % 100 mL IVPB  Status:  Discontinued     04/15/19 1452   04/12/19 0200  vancomycin (VANCOCIN) IVPB 750 mg/150 ml premix  Status:  Discontinued     04/12/19 1455   04/11/19 2130  ceFEPIme (MAXIPIME) 2 g in sodium chloride 0.9 % 100 mL IVPB     04/11/19 2257   04/11/19 2130  metroNIDAZOLE (FLAGYL) IVPB 500 mg     04/11/19 2356   04/11/19 2130  vancomycin (VANCOCIN) IVPB 1000 mg/200 mL premix     04/12/19 0105        Devices    LINES / TUBES:  #7.5 cuffed ETT 8/3>>    Continuous Infusions: . dextrose 5 % 250 mL with vancomycin (VANCOCIN) 1,250 mg infusion    . dextrose 75 mL/hr at 04/22/19 1800  . feeding supplement (VITAL AF 1.2 CAL) Stopped (04/19/19 1600)     Objective: Vitals:   04/22/19  1600 04/22/19 1700 04/22/19 1800 04/22/19 1900  BP: 140/69 (!) 150/72 106/60 140/64  Pulse: (!) 102 (!) 105 95 95  Resp: (!) 30 (!) 32 (!) 26 (!) 31  Temp: 99.3 F (37.4 C) 98.8 F (37.1 C) 99.1 F (37.3 C) 99.3 F (37.4 C)  TempSrc:      SpO2: 96% 93% 93% 94%  Weight:      Height:        Intake/Output Summary (Last 24 hours) at 04/22/2019 1939 Last data filed at 04/22/2019 1800 Gross per 24 hour  Intake 1664.86 ml  Output 2110 ml  Net -445.14 ml   Filed Weights   04/19/19 0500 04/20/19 0500 04/21/19 0500  Weight: 103.3 kg 104 kg 104.5 kg   Physical Exam:  General: Stuck his tongue out when commanded, positive acute respiratory distress Eyes: negative scleral hemorrhage, negative anisocoria, negative icterus ENT: Negative Runny nose, negative gingival bleeding, #7.5 ETT 2 in place negative sign of infection or bleeding, tongue deviated to the right Neck:  Negative scars, masses, torticollis, lymphadenopathy, JVD Lungs: Tachypnea, clear to auscultation bilaterally without wheezes or crackles Cardiovascular: Tachycardic without murmur  gallop or rub normal S1 and S2 Abdomen: negative abdominal pain, nondistended, positive soft, bowel sounds, no rebound, no ascites, no appreciable mass Extremities: No significant cyanosis, clubbing, or edema bilateral lower extremities Skin: Negative rashes, lesions, ulcers Psychiatric: Unable to evaluate secondary to intubation Central nervous system: Patient stuck his tongue out to command however did not react otherwise to painful stimuli.     Data Reviewed: Care during the described time interval was provided by me .  I have reviewed this patient's available data, including medical history, events of note, physical examination, and all test results as part of my evaluation.   CBC: Recent Labs  Lab 04/18/19 1147 04/19/19 0515 04/20/19 0422 04/20/19 0515 04/21/19 0500 04/21/19 0835 04/22/19 0503  WBC 8.9 17.6*  --  12.7* 12.8*  --  15.2*  NEUTROABS 7.3 14.8*  --  10.1* 10.8*  --  13.0*  HGB 14.1 14.2 12.9* 13.3 13.0 12.2* 12.2*  HCT 44.9 46.0 38.0* 43.3 43.2 36.0* 40.4  MCV 94.1 96.6  --  97.1 97.3  --  98.5  PLT 184 279  --  171 162  --  445*   Basic Metabolic Panel: Recent Labs  Lab 04/18/19 0426  04/19/19 0515  04/19/19 1820  04/20/19 0515 04/20/19 2000 04/21/19 0500 04/21/19 0835 04/22/19 0503  NA  --    < > 154*   < > 154*   < > 153* 150* 147* 149* 145  K  --    < > 4.8   < > 4.6   < > 5.0 4.6 4.3 4.6 4.4  CL  --    < > 122*   < > 122*  --  119* 118* 114*  --  111  CO2  --    < > 25   < > 24  --  27 25 26   --  27  GLUCOSE  --    < > 303*   < > 110*  --  247* 246* 173*  --  195*  BUN  --    < > 93*   < > 76*  --  79* 72* 67*  --  54*  CREATININE  --    < > 1.70*   < > 1.30*  --  1.47* 1.57* 1.47*  --  1.15  CALCIUM  --    < >  8.0*   < > 7.5*  --  7.9* 8.1* 8.1*  --  8.1*  MG 2.8*  --  3.0*  --   --   --  2.7*  --  2.7*  --  2.8*   < > = values in this interval not displayed.   GFR: Estimated Creatinine Clearance: 80.1 mL/min (by C-G formula based on SCr of  1.15 mg/dL). Liver Function Tests: Recent Labs  Lab 04/18/19 0420 04/19/19 0515 04/20/19 0515 04/21/19 0500 04/22/19 0503  AST 44* 45* 47* 42* 27  ALT 93* 75* 83* 92* 67*  ALKPHOS 59 52 46 58 66  BILITOT 0.5 0.4 0.7 0.6 0.7  PROT 6.2* 5.7* 5.9* 5.7* 5.7*  ALBUMIN 2.5* 2.5* 3.0* 2.8* 2.7*   No results for input(s): LIPASE, AMYLASE in the last 168 hours. No results for input(s): AMMONIA in the last 168 hours. Coagulation Profile: No results for input(s): INR, PROTIME in the last 168 hours. Cardiac Enzymes: No results for input(s): CKTOTAL, CKMB, CKMBINDEX, TROPONINI in the last 168 hours. BNP (last 3 results) No results for input(s): PROBNP in the last 8760 hours. HbA1C: No results for input(s): HGBA1C in the last 72 hours. CBG: Recent Labs  Lab 04/21/19 2325 04/22/19 0315 04/22/19 0731 04/22/19 1230 04/22/19 1544  GLUCAP 140* 161* 193* 218* 205*   Lipid Profile: No results for input(s): CHOL, HDL, LDLCALC, TRIG, CHOLHDL, LDLDIRECT in the last 72 hours. Thyroid Function Tests: No results for input(s): TSH, T4TOTAL, FREET4, T3FREE, THYROIDAB in the last 72 hours. Anemia Panel: No results for input(s): VITAMINB12, FOLATE, FERRITIN, TIBC, IRON, RETICCTPCT in the last 72 hours. Urine analysis:    Component Value Date/Time   COLORURINE YELLOW 04/11/2019 2125   APPEARANCEUR CLEAR 04/11/2019 2125   LABSPEC 1.025 04/11/2019 2125   PHURINE 5.0 04/11/2019 2125   GLUCOSEU 50 (A) 04/11/2019 2125   HGBUR SMALL (A) 04/11/2019 2125   BILIRUBINUR NEGATIVE 04/11/2019 2125   KETONESUR 5 (A) 04/11/2019 2125   PROTEINUR >=300 (A) 04/11/2019 2125   UROBILINOGEN 0.2 12/22/2011 2130   NITRITE NEGATIVE 04/11/2019 2125   LEUKOCYTESUR NEGATIVE 04/11/2019 2125   Sepsis Labs: '@LABRCNTIP'$ (procalcitonin:4,lacticidven:4)  ) Recent Results (from the past 240 hour(s))  Culture, blood (routine x 2)     Status: None (Preliminary result)   Collection Time: 04/18/19  6:00 PM   Specimen:  BLOOD  Result Value Ref Range Status   Specimen Description   Final    BLOOD RIGHT AC Performed at [facility], [place] [address] [place], [place] [address]    Special Requests   Final    BOTTLES DRAWN AEROBIC AND ANAEROBIC Blood Culture adequate volume Performed at [place] [address] [place], [place] [address]    Culture   Final    NO GROWTH 4 DAYS Performed at [facility], [place] [address] [place], [place] [address]    Report Status PENDING  Incomplete  Culture, blood (routine x 2)     Status: None (Preliminary result)   Collection Time: 04/18/19  6:06 PM   Specimen: BLOOD  Result Value Ref Range Status   Specimen Description   Final    BLOOD RIGHT HAND Performed at [place] [address], [place], [place] [address]    Special Requests   Final    BOTTLES DRAWN AEROBIC AND ANAEROBIC Blood Culture adequate volume Performed at [place] [address], [place], [place] [address]    Culture  Final    NO GROWTH 4 DAYS Performed at [facility], [place] [address], [place], [place] [address]    Report Status PENDING  Incomplete  Culture, respiratory (non-expectorated)     Status: None   Collection Time: 04/18/19 11:35 PM   Specimen: Tracheal Aspirate; Respiratory  Result Value Ref Range Status   Specimen Description   Final    TRACHEAL ASPIRATE Performed at [facility] [address] [place], [place] [address]    Special Requests   Final    NONE Performed at [facility], [place] [address], [place], [place] [address]    Gram Stain   Final    RARE WBC PRESENT, PREDOMINANTLY PMN RARE SQUAMOUS EPITHELIAL CELLS PRESENT MODERATE GRAM POSITIVE COCCI RARE GRAM NEGATIVE RODS RARE GRAM POSITIVE RODS RARE YEAST Performed at [facility], [place] [address], [place], [place] [address]    Culture   Final    MODERATE METHICILLIN  RESISTANT STAPHYLOCOCCUS AUREUS   Report Status 04/21/2019 FINAL  Final   Organism ID, Bacteria METHICILLIN RESISTANT STAPHYLOCOCCUS AUREUS  Final      Susceptibility   Methicillin resistant staphylococcus aureus - MIC*    CIPROFLOXACIN <=0.5 SENSITIVE Sensitive     ERYTHROMYCIN >=8 RESISTANT Resistant     GENTAMICIN <=0.5 SENSITIVE Sensitive     OXACILLIN >=4 RESISTANT Resistant     TETRACYCLINE <=1 SENSITIVE Sensitive     VANCOMYCIN 1 SENSITIVE Sensitive     TRIMETH/SULFA <=10 SENSITIVE Sensitive     CLINDAMYCIN <=0.25 SENSITIVE Sensitive     RIFAMPIN <=0.5 SENSITIVE Sensitive     Inducible Clindamycin NEGATIVE Sensitive     * MODERATE METHICILLIN RESISTANT STAPHYLOCOCCUS AUREUS  Culture, Urine     Status: None   Collection Time: 04/19/19  4:30 PM   Specimen: Urine, Random  Result Value Ref Range Status   Specimen Description   Final    URINE, RANDOM Performed at [facility] [address] [place], [place] [address]    Special Requests   Final    NONE Performed at [facility], [place] [address] [place], [place] [address]    Culture   Final    NO GROWTH Performed at [facility], [place] [address], [place], [place] [address]    Report Status 04/20/2019 FINAL  Final         Radiology Studies: Ct Head Wo Contrast  Result Date: 04/22/2019 CLINICAL DATA:  68 year old male with focal neurologic deficit on exam. EXAM: CT HEAD WITHOUT CONTRAST TECHNIQUE: Contiguous axial images were obtained from the base of the skull through the vertex without intravenous contrast. COMPARISON:  Head CT dated 12/09/2016 FINDINGS: Brain: There is mild age-related atrophy and chronic microvascular ischemic changes. 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 There is no acute intracranial hemorrhage. No mass effect or midline shift. No extra-axial fluid collection. Vascular: No hyperdense vessel or unexpected calcification. Skull: Normal. Negative for  fracture or focal lesion. Sinuses/Orbits: There is complete opacification of the left maxillary sinus with chronic right maxillary sinus mucoperiosteal thickening. No air-fluid level. The mastoid air cells are clear. Other: An endotracheal and an enteric tube are partially visualized. IMPRESSION: 1. No acute intracranial hemorrhage. 2. Mild age-related atrophy and chronic microvascular ischemic changes. 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 Electronically Signed   By: Anner Crete M.D.   On: 04/22/2019 02:09        Scheduled Meds: . arformoterol  15 mcg  Nebulization BID  . aspirin  81 mg Oral Daily  . budesonide (PULMICORT) nebulizer solution  0.5 mg Nebulization BID  . chlorhexidine gluconate (MEDLINE KIT)  15 mL Mouth Rinse BID  . Chlorhexidine Gluconate Cloth  6 each Topical Daily  . clopidogrel  75 mg Per Tube Daily  . enoxaparin (LOVENOX) injection  100 mg Subcutaneous Q12H  . feeding supplement (PRO-STAT SUGAR FREE 64)  30 mL Per Tube Daily  . folic acid  1 mg Per Tube Daily  . free water  300 mL Per Tube Q4H  . insulin aspart  0-20 Units Subcutaneous Q4H  . insulin glargine  32 Units Subcutaneous Daily  . ipratropium-albuterol  3 mL Nebulization Q6H  . mouth rinse  15 mL Mouth Rinse 10 times per day  . metoprolol tartrate  25 mg Per Tube BID  . multivitamin  15 mL Per Tube Daily  . pantoprazole sodium  40 mg Per Tube Daily   Continuous Infusions: . dextrose 5 % 250 mL with vancomycin (VANCOCIN) 1,250 mg infusion    . dextrose 75 mL/hr at 04/22/19 1800  . feeding supplement (VITAL AF 1.2 CAL) Stopped (04/19/19 1600)     LOS: 10 days   The patient is critically ill with multiple organ systems failure and requires high complexity decision making for assessment and support, frequent evaluation and titration of therapies, application of advanced monitoring technologies and extensive interpretation of multiple databases. Critical Care Time devoted to patient care services  described in this note  Time spent: 40 minutes    Woodward Klem, Geraldo Docker, MD [facility] Pager [phone]  If 7PM-7AM, please contact night-coverage [email or web address] Password [facility] 04/22/2019, 7:39 PM

## 2019-04-22 NOTE — Progress Notes (Signed)
ANTICOAGULATION & ANTIBIOTIC CONSULT NOTE - Follow Up Consult  Pharmacy Consult for heparin Indication: atrial fibrillation  Allergies  Allergen Reactions  . Ivp Dye [Iodinated Diagnostic Agents] Itching and Rash    Patient Measurements: Height: 6' (182.9 cm) Weight: 230 lb 6.1 oz (104.5 kg) IBW/kg (Calculated) : 77.6 Heparin Dosing Weight: 98.5 kg  Vital Signs: Temp: 97.9 F (36.6 C) (08/11 0600) BP: 118/73 (08/11 0600) Pulse Rate: 88 (08/11 0600)  Labs: Recent Labs    04/20/19 0515 04/20/19 2000 04/21/19 0500 04/21/19 0835 04/21/19 1525 04/21/19 2300 04/22/19 0503  HGB 13.3  --  13.0 12.2*  --   --  12.2*  HCT 43.3  --  43.2 36.0*  --   --  40.4  PLT 171  --  162  --   --   --  133*  HEPARINUNFRC 0.38  --  0.16*  --  0.22* 0.27*  --   CREATININE 1.47* 1.57* 1.47*  --   --   --  1.15    Estimated Creatinine Clearance: 80.1 mL/min (by C-G formula based on SCr of 1.15 mg/dL).  Assessment: 68 yo M on IV heparin for new onset Afib. Heparin level is now therapeutic at 0.57 on 1050 units/hr of IV heparin. Hgb stable, plts low but stable. Of note, patient experienced some bleeding near the central line site which has since resolved with thrombi-pad placement. No further bleeding from peripheral IV site today.  Pharmacy is consulted to change from Heparin IV to Lovenox.  Today, 04/22/19  HL 0.23, remains subtherapeutic despite increased heparin at 1650 units/hr  CBC:  Hgb remains low/stable.  Plt decreased to 133  SCr 1.15, CrCl > 30 ml/min  No line issues per RN.  Old peripheral line site on the left forearm oozing has not reoccurred per RN. Continue to monitor closely for bleeding.  Goal of Therapy:  Heparin level 0.3-0.7 units/ml Monitor platelets by anticoagulation protocol: Yes   Plan:   Stop Heparin infusion  Start Lovenox 100 mg SQ q12h  Monitor CBC, s/s of bleed   Gretta Arab PharmD, BCPS Clinical pharmacist phone 7am- 5pm: [phone] 04/22/2019  8:00 AM

## 2019-04-22 NOTE — Progress Notes (Signed)
ANTICOAGULATION & ANTIBIOTIC CONSULT NOTE - Follow Up Consult  Pharmacy Consult for heparin Indication: atrial fibrillation  Allergies  Allergen Reactions  . Ivp Dye [Iodinated Diagnostic Agents] Itching and Rash    Patient Measurements: Height: 6' (182.9 cm) Weight: 230 lb 6.1 oz (104.5 kg) IBW/kg (Calculated) : 77.6 Heparin Dosing Weight: 98.5 kg  Vital Signs: Temp: 98.4 F (36.9 C) (08/10 1800) BP: 162/74 (08/10 1800) Pulse Rate: 104 (08/10 1945)  Labs: Recent Labs    04/19/19 0515  04/20/19 0515 04/20/19 2000 04/21/19 0500 04/21/19 0835 04/21/19 1525 04/21/19 2300  HGB 14.2   < > 13.3  --  13.0 12.2*  --   --   HCT 46.0   < > 43.3  --  43.2 36.0*  --   --   PLT 279  --  171  --  162  --   --   --   HEPARINUNFRC 0.42  --  0.38  --  0.16*  --  0.22* 0.27*  CREATININE 1.70*   < > 1.47* 1.57* 1.47*  --   --   --    < > = values in this interval not displayed.    Estimated Creatinine Clearance: 62.6 mL/min (A) (by C-G formula based on SCr of 1.47 mg/dL (H)).  Assessment: 68 yo M on IV heparin for new onset Afib. Heparin level is now therapeutic at 0.57 on 1050 units/hr of IV heparin. Hgb stable, plts low but stable. Of note, patient experienced some bleeding near the central line site which has since resolved with thrombi-pad placement.   Today, 04/22/19  HL 0.27, increased but remains subtherapeutic on heparin at 1500 units/hr  CBC:  Hgb remains low/stable.  Plt decreased to 162.  No line issues per RN.  Old peripheral line site on the left forearm  oozing has not reoccurred per RN. Continue to monitor closely for bleeding.  Goal of Therapy:  Heparin level 0.3-0.7 units/ml Monitor platelets by anticoagulation protocol: Yes   Plan:   Increase IV heparin to 1650 units/hr    Obtain HL 6 hours after rate change   Monitor daily heparin level, CBC, s/s of bleed   Gretta Arab PharmD, BCPS Clinical pharmacist phone 7am- 5pm: [phone] 04/22/2019 12:10  AM

## 2019-04-22 NOTE — Progress Notes (Signed)
RT transported patient to CT and back to ICU without any complications.

## 2019-04-22 NOTE — TOC Progression Note (Signed)
Transition of Care [facility]) - Progression Note    Patient Details  Name: Robert [name] MRN: [id number] Date of Birth: 16-Feb-1951  [facility]) CM/SW Contact  Anokhi Shannon, Abelino Derrick, RN Phone Number: 04/22/2019, 12:33 PM  Clinical Narrative:    PTA independent from home with son.  Per pts daughter; pt did have cane I the home however only used it when necessary.  Pt remains on ventilator and critical ill - discharge disposition will depend on progression.  TOC will continue to follow.     Barriers to Discharge: Other (comment)(COVID positive and continued medical workup)  Expected Discharge Plan and Services         Living arrangements for the past 2 months: Single Family Home                                       Social Determinants of Health (SDOH) Interventions    Readmission Risk Interventions No flowsheet data found.

## 2019-04-22 NOTE — Progress Notes (Signed)
Pharmacy Antibiotic Note  Robert [name] is a 68 y.o. male admitted on 04/11/2019 with pneumonia.  Pharmacy has been consulted for vancomycin dosing for MRSA pneumonia.  Plan: - Increase to Vancomycin 1250 mg IV q12h. - Goal AUC 400-550.  Expected AUC: 530 using SCr 1.15 - Monitor levels as indicated  - Stop date 8/14 to complete 7 days.   Height: 6' (182.9 cm) Weight: 230 lb 6.1 oz (104.5 kg) IBW/kg (Calculated) : 77.6  Temp (24hrs), Avg:98.4 F (36.9 C), Min:97.7 F (36.5 C), Max:99.3 F (37.4 C)  Recent Labs  Lab 04/18/19 1147  04/18/19 2338 04/19/19 0340 04/19/19 0515  04/19/19 1449 04/19/19 1820 04/20/19 0515 04/20/19 2000 04/21/19 0500 04/22/19 0503  WBC 8.9  --   --   --  17.6*  --   --   --  12.7*  --  12.8* 15.2*  CREATININE  --    < >  --  1.64* 1.70*   < >  --  1.30* 1.47* 1.57* 1.47* 1.15  LATICACIDVEN  --   --  1.6 1.2  --   --  1.1  --   --   --   --   --    < > = values in this interval not displayed.    Estimated Creatinine Clearance: 80.1 mL/min (by C-G formula based on SCr of 1.15 mg/dL).    Allergies  Allergen Reactions  . Ivp Dye [Iodinated Diagnostic Agents] Itching and Rash   Antimicrobials this admission:  7/31 vancomycin >> 8/1, resumed 8/8 >> (8/14) 7/31 cefepime >> 8/5 7/31 metronidazole x1 8/1 Remdesivir >> 8/5 8/1 Actemra  (PTA tocilizumab 162mg  SQ weekly on Mondays for RA)  8/7 Azithro >> 8/9 8/8 Zosyn >> 8/10  Dose adjustments this admission:  8/11 empiric renal adjustment.  Microbiology results:  7/31 [facility] x2:  NGF 7/31 UCx:  NGF 7/31 SARS CoV-2: positive 8/1 Ta: rare normal flora 8/1 MRSA PCR: neg 8/7 TA:  Moderate MRSA   8/7 BCx: ngtd 8/8 UCx: NGF    Thank you for allowing pharmacy to be a part of this patient's care.  Gretta Arab PharmD, BCPS Clinical pharmacist phone 7am- 5pm: [phone] 04/22/2019 10:18 AM

## 2019-04-22 NOTE — Progress Notes (Addendum)
Spoke with patients daughter on the phone and gave updates. She asked to video chat with her dad but advised that it would have to be done from patients phone. Accessed patients phone and attempted to chat with patient through [place] as she advised but no answer. Attempted 5 times.   Attempted 2 more times with no success. Daughter called back and I advised that I was attempting to video chat. Attempted while the daughter was on the phone with no success. Put daughter on speaker phone and held phone to patients ear. He nodded to their questions. Daughter has questions for the doctors. Advised her to make a list and call back in morning after they round. Daughter was pleased with conversation

## 2019-04-22 NOTE — Progress Notes (Signed)
NAME:  Robert [name], MRN:  [id number], DOB:  27-Apr-1951, LOS: 27 ADMISSION DATE:  04/11/2019, CONSULTATION DATE:  8/1 REFERRING MD:  Dr. Roderic Palau, CHIEF COMPLAINT:  dyspnea   Brief History   68 y/o male admitted on 8/1 in the setting of ARDS from COVID-19 pneumonia.  Required intubation and mechanical ventilation at an outside hospital, has high sedative needs  Past Medical History  Anxiety COPD GERD CAD Hypertension Hyperlipidemia Rheumatoid arthritis History of systolic heart failure, however 08/2018 Echo showed LVEF 50-55%, left atrium dilated  Significant Hospital Events   8/1 admission to [facility] ICU 8/2 extubated 8/3 early AM agitated, placed on BIPAP  Consults:  PCCM  Procedures:  8/1 ETT > 8/2, 8/3 >  8/1 L subclavian CVL >   Significant Diagnostic Tests:  08/2018 Echo> LVEF 50-55%, left atrium dilated  Micro Data:  7/31 blood >  7/31 sars-cov-2 > positive  Antimicrobials:  7/31 Cefepime >  7/31 Flagyl > 7/31 7/31 Vanc > 7/31 8/1 remdesivir > 8/1 actemra >  8/1 decadron >    Interim history/subjective:    No events overnight. Opens eyes to voice and follows simple commands. Does not move upper and lower extremities  Objective   Blood pressure 140/69, pulse (!) 102, temperature 99.3 F (37.4 C), resp. rate (!) 30, height 6' (1.829 m), weight 104.5 kg, SpO2 96 %.    Vent Mode: PRVC FiO2 (%):  [35 %-40 %] 35 % Set Rate:  [26 bmp] 26 bmp Vt Set:  [460 mL] 460 mL PEEP:  [5 cmH20] 5 cmH20 Plateau Pressure:  [18 cmH20-24 cmH20] 18 cmH20   Intake/Output Summary (Last 24 hours) at 04/22/2019 1720 Last data filed at 04/22/2019 1700 Gross per 24 hour  Intake 1881.43 ml  Output 1910 ml  Net -28.57 ml   Filed Weights   04/19/19 0500 04/20/19 0500 04/21/19 0500  Weight: 103.3 kg 104 kg 104.5 kg   Physical Exam: General: Critically ill-appearing, on minima vent settings HENT: [place], AT, ETT in place Eyes: EOMI, no scleral icterus Respiratory: Clear to  auscultation bilaterally.  No crackles, wheezing or rales Cardiovascular: RRR, -M/R/G, no JVD GI: BS+, soft, nontender Extremities:Anasarca Neuro: Opens eyes to voice, follows simple commands, does not move extremities x 4 on commands GU: Foley in place  Resolved Hospital Problem list   Septic shock 2/2 S. Aureus pna  Assessment & Plan:   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: C/b COPD Exacerbation, pulmonary edema Self-extubated and re-intubated within 24 hours Pressure support as tolerated. Extubation precluded by mental status Continue mechanical ventilation per ARDS protocol Target TVol 6-8cc/kgIBW Target Plateau Pressure < 30cm H20 Target driving pressure less than 15 cm of water Target PaO2 55-65: titrate PEEP/FiO2 per protocol Ventilator associated pneumonia prevention protocol Antibiotics as below 8/11: Diurese  S. Aureus PNA  Continue Vanc F/u final blood and respiratory cultures  COPD with acute exacerbation S/p solumedrol Continue bronchodilators: Brovana and Pulmicort BID, Duonebs TID Antibiotics as above  AKI Monitor UOP/Cr Avoid nephrotoxic agents  Acute encephalopathy: off sedation. Improved. CT NAICA PRN pain medications for agitation  Non-sustained VT Monitor electrolytes K and Mg, goal 4 and 2 respectively Telemetry  Hypernatremia FWF and IVF per TRH Trend BMP  Atrial fibrillation Metoprolol Change heparin to lovenox  Best practice:  Diet: tube feeding Pain/Anxiety/Delirium protocol (if indicated): as above VAP protocol (if indicated): yes DVT prophylaxis: lovenox GI prophylaxis: Pantoprazole for stress ulcer prophylaxis Glucose control: SSI Mobility: bed rest Code Status: full Family Communication:  Updated daughter and son on 8/8. Called 8/10 with no answer Disposition: remain in ICU  Labs   CBC: Recent Labs  Lab 04/18/19 1147 04/19/19 0515 04/20/19 0422 04/20/19 0515 04/21/19 0500 04/21/19 0835 04/22/19 0503  WBC 8.9 17.6*  --   12.7* 12.8*  --  15.2*  NEUTROABS 7.3 14.8*  --  10.1* 10.8*  --  13.0*  HGB 14.1 14.2 12.9* 13.3 13.0 12.2* 12.2*  HCT 44.9 46.0 38.0* 43.3 43.2 36.0* 40.4  MCV 94.1 96.6  --  97.1 97.3  --  98.5  PLT 184 279  --  171 162  --  133*    Basic Metabolic Panel: Recent Labs  Lab 04/18/19 0426  04/19/19 0515  04/19/19 1820  04/20/19 0515 04/20/19 2000 04/21/19 0500 04/21/19 0835 04/22/19 0503  NA  --    < > 154*   < > 154*   < > 153* 150* 147* 149* 145  K  --    < > 4.8   < > 4.6   < > 5.0 4.6 4.3 4.6 4.4  CL  --    < > 122*   < > 122*  --  119* 118* 114*  --  111  CO2  --    < > 25   < > 24  --  27 25 26   --  27  GLUCOSE  --    < > 303*   < > 110*  --  247* 246* 173*  --  195*  BUN  --    < > 93*   < > 76*  --  79* 72* 67*  --  54*  CREATININE  --    < > 1.70*   < > 1.30*  --  1.47* 1.57* 1.47*  --  1.15  CALCIUM  --    < > 8.0*   < > 7.5*  --  7.9* 8.1* 8.1*  --  8.1*  MG 2.8*  --  3.0*  --   --   --  2.7*  --  2.7*  --  2.8*   < > = values in this interval not displayed.   GFR: Estimated Creatinine Clearance: 80.1 mL/min (by C-G formula based on SCr of 1.15 mg/dL). Recent Labs  Lab 04/18/19 2338 04/19/19 0340 04/19/19 0515 04/19/19 1449 04/20/19 0515 04/21/19 0500 04/22/19 0503  WBC  --   --  17.6*  --  12.7* 12.8* 15.2*  LATICACIDVEN 1.6 1.2  --  1.1  --   --   --     Liver Function Tests: Recent Labs  Lab 04/18/19 0420 04/19/19 0515 04/20/19 0515 04/21/19 0500 04/22/19 0503  AST 44* 45* 47* 42* 27  ALT 93* 75* 83* 92* 67*  ALKPHOS 59 52 46 58 66  BILITOT 0.5 0.4 0.7 0.6 0.7  PROT 6.2* 5.7* 5.9* 5.7* 5.7*  ALBUMIN 2.5* 2.5* 3.0* 2.8* 2.7*   No results for input(s): LIPASE, AMYLASE in the last 168 hours. No results for input(s): AMMONIA in the last 168 hours.  ABG    Component Value Date/Time   PHART 7.348 (L) 04/21/2019 0835   PCO2ART 48.3 (H) 04/21/2019 0835   PO2ART 58.0 (L) 04/21/2019 0835   HCO3 26.6 04/21/2019 0835   TCO2 28 04/21/2019 0835    ACIDBASEDEF 1.0 04/20/2019 0422   O2SAT 88.0 04/21/2019 0835     Coagulation Profile: No results for input(s): INR, PROTIME in the last 168 hours.  Cardiac Enzymes: No results  for input(s): CKTOTAL, CKMB, CKMBINDEX, TROPONINI in the last 168 hours.  HbA1C: Hgb A1c MFr Bld  Date/Time Value Ref Range Status  04/11/2019 09:50 PM 7.4 (H) 4.8 - 5.6 % Final    Comment:    (NOTE) Pre diabetes:          5.7%-6.4% Diabetes:              >6.4% Glycemic control for   <7.0% adults with diabetes   06/07/2017 12:02 AM 9.1 (H) 4.8 - 5.6 % Final    Comment:    (NOTE) Pre diabetes:          5.7%-6.4% Diabetes:              >6.4% Glycemic control for   <7.0% adults with diabetes     CBG: Recent Labs  Lab 04/21/19 2325 04/22/19 0315 04/22/19 0731 04/22/19 1230 04/22/19 1544  GLUCAP 140* 161* 193* 218* 205*   COVID-19 Labs  Recent Labs    04/20/19 0515 04/21/19 0500 04/22/19 0503  DDIMER 2.09* 3.19* 2.18*  CRP <0.8 <0.8 <0.8    Lab Results  Component Value Date   SARSCOV2NAA POSITIVE (A) 04/11/2019   SARSCOV2NAA Detected (A) 04/11/2019     Critical care time: 35 minutes    The patient is critically ill with multiple organ systems failure and requires high complexity decision making for assessment and support, frequent evaluation and titration of therapies, application of advanced monitoring technologies and extensive interpretation of multiple databases.   Discussed and co-managed patient care with PCCM-Hospitalist. Coordinated care with RT, RN and pharmacist.  [name], M.D. [facility] Pulmonary/Critical Care Medicine 04/22/2019 5:20 PM  Pager: [phone] After hours pager: [phone]

## 2019-04-23 ENCOUNTER — Inpatient Hospital Stay: Payer: Self-pay

## 2019-04-23 ENCOUNTER — Inpatient Hospital Stay (HOSPITAL_COMMUNITY): Payer: Medicare HMO

## 2019-04-23 DIAGNOSIS — E8770 Fluid overload, unspecified: Secondary | ICD-10-CM

## 2019-04-23 DIAGNOSIS — R451 Restlessness and agitation: Secondary | ICD-10-CM

## 2019-04-23 LAB — COMPREHENSIVE METABOLIC PANEL
ALT: 56 U/L — ABNORMAL HIGH (ref 0–44)
AST: 27 U/L (ref 15–41)
Albumin: 2.6 g/dL — ABNORMAL LOW (ref 3.5–5.0)
Alkaline Phosphatase: 75 U/L (ref 38–126)
Anion gap: 8 (ref 5–15)
BUN: 51 mg/dL — ABNORMAL HIGH (ref 8–23)
CO2: 27 mmol/L (ref 22–32)
Calcium: 8.4 mg/dL — ABNORMAL LOW (ref 8.9–10.3)
Chloride: 112 mmol/L — ABNORMAL HIGH (ref 98–111)
Creatinine, Ser: 1.19 mg/dL (ref 0.61–1.24)
GFR calc Af Amer: >60 mL/min (ref >60)
GFR calc non Af Amer: >60 mL/min (ref >60)
Glucose, Bld: 154 mg/dL — ABNORMAL HIGH (ref 70–99)
Potassium: 3.8 mmol/L (ref 3.5–5.1)
Sodium: 147 mmol/L — ABNORMAL HIGH (ref 135–145)
Total Bilirubin: 0.8 mg/dL (ref 0.3–1.2)
Total Protein: 6 g/dL — ABNORMAL LOW (ref 6.5–8.1)

## 2019-04-23 LAB — BASIC METABOLIC PANEL
Anion gap: 12 (ref 5–15)
BUN: 66 mg/dL — ABNORMAL HIGH (ref 8–23)
CO2: 22 mmol/L (ref 22–32)
Calcium: 8.2 mg/dL — ABNORMAL LOW (ref 8.9–10.3)
Chloride: 112 mmol/L — ABNORMAL HIGH (ref 98–111)
Creatinine, Ser: 1.77 mg/dL — ABNORMAL HIGH (ref 0.61–1.24)
GFR calc Af Amer: 46 mL/min — ABNORMAL LOW (ref >60)
GFR calc non Af Amer: 39 mL/min — ABNORMAL LOW (ref >60)
Glucose, Bld: 256 mg/dL — ABNORMAL HIGH (ref 70–99)
Potassium: 4 mmol/L (ref 3.5–5.1)
Sodium: 146 mmol/L — ABNORMAL HIGH (ref 135–145)

## 2019-04-23 LAB — CBC WITH DIFFERENTIAL/PLATELET
Abs Immature Granulocytes: 0.04 10*3/uL (ref 0.00–0.07)
Basophils Absolute: 0.1 10*3/uL (ref 0.0–0.1)
Basophils Relative: 0 %
Eosinophils Absolute: 0.2 10*3/uL (ref 0.0–0.5)
Eosinophils Relative: 2 %
HCT: 38.4 % — ABNORMAL LOW (ref 39.0–52.0)
Hemoglobin: 11.9 g/dL — ABNORMAL LOW (ref 13.0–17.0)
Immature Granulocytes: 0 %
Lymphocytes Relative: 8 %
Lymphs Abs: 0.9 10*3/uL (ref 0.7–4.0)
MCH: 29.5 pg (ref 26.0–34.0)
MCHC: 31 g/dL (ref 30.0–36.0)
MCV: 95 fL (ref 80.0–100.0)
Monocytes Absolute: 0.6 10*3/uL (ref 0.1–1.0)
Monocytes Relative: 6 %
Neutro Abs: 9.3 10*3/uL — ABNORMAL HIGH (ref 1.7–7.7)
Neutrophils Relative %: 84 %
Platelets: 122 10*3/uL — ABNORMAL LOW (ref 150–400)
RBC: 4.04 MIL/uL — ABNORMAL LOW (ref 4.22–5.81)
RDW: 16.6 % — ABNORMAL HIGH (ref 11.5–15.5)
WBC: 11.1 10*3/uL — ABNORMAL HIGH (ref 4.0–10.5)
nRBC: 0 % (ref 0.0–0.2)

## 2019-04-23 LAB — CULTURE, BLOOD (ROUTINE X 2)
Culture: NO GROWTH
Culture: NO GROWTH
Special Requests: ADEQUATE
Special Requests: ADEQUATE

## 2019-04-23 LAB — GLUCOSE, CAPILLARY
Glucose-Capillary: 144 mg/dL — ABNORMAL HIGH (ref 70–99)
Glucose-Capillary: 179 mg/dL — ABNORMAL HIGH (ref 70–99)
Glucose-Capillary: 200 mg/dL — ABNORMAL HIGH (ref 70–99)
Glucose-Capillary: 219 mg/dL — ABNORMAL HIGH (ref 70–99)
Glucose-Capillary: 243 mg/dL — ABNORMAL HIGH (ref 70–99)
Glucose-Capillary: 252 mg/dL — ABNORMAL HIGH (ref 70–99)
Glucose-Capillary: 308 mg/dL — ABNORMAL HIGH (ref 70–99)

## 2019-04-23 LAB — D-DIMER, QUANTITATIVE: D-Dimer, Quant: 1.98 ug/mL-FEU — ABNORMAL HIGH (ref 0.00–0.50)

## 2019-04-23 LAB — VANCOMYCIN, TROUGH: Vancomycin Tr: 36 ug/mL (ref 15–20)

## 2019-04-23 LAB — C-REACTIVE PROTEIN: CRP: 1.5 mg/dL — ABNORMAL HIGH (ref <1.0)

## 2019-04-23 LAB — MAGNESIUM: Magnesium: 2.7 mg/dL — ABNORMAL HIGH (ref 1.7–2.4)

## 2019-04-23 MED ORDER — DEXMEDETOMIDINE HCL IN NACL 400 MCG/100ML IV SOLN
0.0000 ug/kg/h | INTRAVENOUS | Status: AC
  Administered 2019-04-23 – 2019-04-25 (×4): 0.3 ug/kg/h via INTRAVENOUS
  Administered 2019-04-25: 0.2 ug/kg/h via INTRAVENOUS
  Administered 2019-04-26: 0.5 ug/kg/h via INTRAVENOUS
  Filled 2019-04-23 (×7): qty 100

## 2019-04-23 MED ORDER — FUROSEMIDE 10 MG/ML IJ SOLN
40.0000 mg | Freq: Once | INTRAMUSCULAR | Status: AC
  Administered 2019-04-23: 40 mg via INTRAVENOUS
  Filled 2019-04-23: qty 4

## 2019-04-23 MED ORDER — VANCOMYCIN VARIABLE DOSE PER UNSTABLE RENAL FUNCTION (PHARMACIST DOSING): Status: DC

## 2019-04-23 MED ORDER — "THROMBI-PAD 3""X3"" EX PADS"
1.0000 | MEDICATED_PAD | Freq: Once | CUTANEOUS | Status: AC
  Administered 2019-04-24: 1 via TOPICAL
  Filled 2019-04-23: qty 1

## 2019-04-23 MED ORDER — PRO-STAT SUGAR FREE PO LIQD
30.0000 mL | Freq: Three times a day (TID) | ORAL | Status: DC
  Administered 2019-04-23 – 2019-05-05 (×35): 30 mL
  Filled 2019-04-23 (×33): qty 30

## 2019-04-23 MED ORDER — FREE WATER
200.0000 mL | Freq: Three times a day (TID) | Status: DC
  Administered 2019-04-23 – 2019-04-25 (×6): 200 mL

## 2019-04-23 MED ORDER — VITAL AF 1.2 CAL PO LIQD
1000.0000 mL | ORAL | Status: DC
  Administered 2019-04-23 – 2019-05-05 (×13): 1000 mL
  Filled 2019-04-23: qty 1000

## 2019-04-23 NOTE — Progress Notes (Signed)
Spoke with patient's daughter, [name] and provided full update.

## 2019-04-23 NOTE — Progress Notes (Signed)
[facility] Progress Note  Patient: Robert [name] PJA:[id number]   PCP: Redmond School, MD DOB: Apr 10, 1951   DOA: 04/11/2019   DOS: 04/23/2019   Date of Service: the patient was seen and examined on 04/23/2019  Brief hospital course: Pt. with PMH of  COPD, coronary artery disease, diabetes, ischemic cardiomyopathy, rheumatoid arthritis; admitted on 04/11/2019, Presents to the hospital with complaints of progressive worsening shortness of breath. He was mildly febrile and had worsening shortness of breath so he called EMS. In the ER, he was noted to be substantially hypoxic, had mental status changes. He tested positive for COVID-19. He was intubated in the emergency room for persistent hypoxia. He has been transferred to [facility] for further management. Currently further plan is continue with diuresis and SBT.  Subjective: No acute events.  No nausea no vomiting.  Assessment and Plan: 1. Vent Mode: PRVC FiO2 (%):  [35 %-40 %] 35 % Set Rate:  [26 bmp] 26 bmp Vt Set:  [460 mL] 460 mL PEEP:  [5 cmH20] 5 cmH20 Plateau Pressure:  [15 HAL93-79 cmH20] 15 cmH20  Acute COVID-19 Viral illness Lab Results  Component Value Date   SARSCOV2NAA POSITIVE (A) 04/11/2019   SARSCOV2NAA Detected (A) 04/11/2019   CXR: hazy bilateral peripheral opacities  Recent Labs    04/21/19 0500 04/22/19 0503 04/23/19 0501  DDIMER 3.19* 2.18* 1.98*  CRP <0.8 <0.8 1.5*    Tmax last 24 hours: Afebrile Oxygen requirements: On vent  Antibiotics: On IV vancomycin for MRSA Diuretics: IV Lasix Vitamin C and Zinc: Continue DVT Prophylaxis: Subcutaneous Lovenox currently on hold Remdesivir: Completed Steroids: Continue Actemra: X1 off-label use of Actemra: This patient has confirmed COVID-19 in the setting of the ongoing 2020 coronavirus pandemic.  He has hypoxia and is high-risk for intubation, but expected to survive >48 hours and has good baseline functional status.  he is not known to be  on immunomodulators, anti-rejection medications, or cancer chemotherapy, has no history of TB or latent TB, and no history of diverticulitis or intestinal perforation.  Platelets are >50K, ANC is >500, and ALT/AST are below 5x ULN with no known hepatitis B infection. The investigational nature of this medication was discussed with the patient/HCPOA and they choose to proceed as the potential benefits are felt to outweigh risks at this time.  -Tocilizumab 8 mg/kg now -Monitor for infusion reaction  Prone positioning: Patient encouraged to stay in prone position as much as possible.  PPE During this encounter: Patient Isolation: Airborne + Droplet + Contact HCP PPE: CAPR, gown. gloves Patient PPE: None  The treatment plan and use of medications and known side effects were discussed with patient/family. It was clearly explained that there is no proven definitive treatment for COVID-19 infection yet. Any medications used here are based on case reports/anecdotal data which are not peer-reviewed and has not been studied using randomized control trials.  Complete risks and long-term side effects are unknown, however in the best clinical judgment they seem to be of some clinical benefit rather than medical risks.  Patient/family agree with the treatment plan and want to receive these treatments as indicated.   2.  MRSA healthcare associated pneumonia. Septic shock. Required pressors now off. Started on IV vancomycin and Zosyn and azithromycin from 04/19/2019. Sputum culture came back positive for MRSA. Blood cultures so far negative. Currently only on IV vancomycin. Continue for now.  3.  Paroxysmal A. fib with RVR. Currently in normal sinus rhythm. On metoprolol 25 mg twice daily.  May increase it further if rate and blood pressure still elevated tomorrow. [place] Vascor of 5.  Patient was on IV heparin.  Currently anticoagulation is on hold due to ET bleeding. Continue to monitor.  4.  Acute on  chronic diastolic CHF. Essential hypertension. History of CAD Continue Lopressor. Continue IV diuresis. PCI 2018. Resuming aspirin and Plavix.  5.  Endotracheal tube clotting. Patient was on therapeutic heparin. Transition to prophylactic heparin. ET tube was clotted.  Exchange on 04/23/2019. Currently no anticoagulation. Continue SCDs  6.  Hypernatremia.  Hyperchloremia Sodium still 147. Reduce free water and continue with D5. Monitor. At risk for worsening of hyponatremia secondary to IV diuresis.  7.  Pressure ulcer medial sacrum bilateral buttocks.  Identified on 04/17/2019 not POA per documentation. Continue frequent dressing changes.  Pressure Injury 04/17/19 Sacrum Medial Deep Tissue Injury - Purple or maroon localized area of discolored intact skin or blood-filled blister due to damage of underlying soft tissue from pressure and/or shear. dark purple with some skin sloughing on lowe (Active)  04/17/19 0730  Location: Sacrum  Location Orientation: Medial  Staging: Deep Tissue Injury - Purple or maroon localized area of discolored intact skin or blood-filled blister due to damage of underlying soft tissue from pressure and/or shear.  Wound Description (Comments): dark purple with some skin sloughing on lower sacral area  Present on Admission: No     Pressure Injury 04/17/19 Buttocks Right;Lower Unstageable - Full thickness tissue loss in which the base of the ulcer is covered by slough (yellow, tan, gray, green or brown) and/or eschar (tan, brown or black) in the wound bed. (Active)  04/17/19 0730  Location: Buttocks  Location Orientation: Right;Lower  Staging: Unstageable - Full thickness tissue loss in which the base of the ulcer is covered by slough (yellow, tan, gray, green or brown) and/or eschar (tan, brown or black) in the wound bed.  Wound Description (Comments):   Present on Admission: No     Pressure Injury 04/17/19 Buttocks Left;Lower Deep Tissue Injury - Purple  or maroon localized area of discolored intact skin or blood-filled blister due to damage of underlying soft tissue from pressure and/or shear. (Active)  04/17/19 0730  Location: Buttocks  Location Orientation: Left;Lower  Staging: Deep Tissue Injury - Purple or maroon localized area of discolored intact skin or blood-filled blister due to damage of underlying soft tissue from pressure and/or shear.  Wound Description (Comments):   Present on Admission: No     Diet: NPO  DVT Prophylaxis: SCD, pharmacological prophylaxis contraindicated due to ET tube bleeding  Advance goals of care discussion: Full code  Family Communication: no family was present at bedside, at the time of interview.  Discussed with family on 04/23/2019, opportunity was given to ask question and all questions were answered satisfactorily.   Disposition:  Discharge to to be determined .  Consultants: PCCM  Procedures: none  Scheduled Meds:  arformoterol  15 mcg Nebulization BID   aspirin  81 mg Oral Daily   budesonide (PULMICORT) nebulizer solution  0.5 mg Nebulization BID   chlorhexidine gluconate (MEDLINE KIT)  15 mL Mouth Rinse BID   Chlorhexidine Gluconate Cloth  6 each Topical Daily   clopidogrel  75 mg Per Tube Daily   feeding supplement (PRO-STAT SUGAR FREE 64)  30 mL Per Tube TID   folic acid  1 mg Per Tube Daily   free water  200 mL Per Tube Q8H   insulin aspart  0-20 Units Subcutaneous Q4H  insulin glargine  32 Units Subcutaneous Daily   ipratropium-albuterol  3 mL Nebulization Q6H   mouth rinse  15 mL Mouth Rinse 10 times per day   metoprolol tartrate  25 mg Per Tube BID   multivitamin  15 mL Per Tube Daily   pantoprazole sodium  40 mg Per Tube Daily   Thrombi-Pad  1 each Topical Once   Continuous Infusions:  dexmedetomidine (PRECEDEX) IV infusion 0.3 mcg/kg/hr (04/23/19 1500)   dextrose 5 % 250 mL with vancomycin (VANCOCIN) 1,250 mg infusion 166.7 mL/hr at 04/23/19 0929    feeding supplement (VITAL AF 1.2 CAL) 70 mL/hr at 04/23/19 1728   PRN Meds: acetaminophen, dextrose, fentaNYL (SUBLIMAZE) injection, hydrALAZINE, HYDROmorphone Antibiotics: Anti-infectives (From admission, onward)   Start     Dose/Rate Route Frequency Ordered Stop   04/22/19 2200  dextrose 5 % 250 mL with vancomycin (VANCOCIN) 1,250 mg infusion     166.7 mL/hr  Intravenous Every 12 hours 04/22/19 1324 04/26/19 0959   04/20/19 1400  vancomycin (VANCOCIN) 1,250 mg in sodium chloride 0.9 % 250 mL IVPB  Status:  Discontinued     1,250 mg 166.7 mL/hr over 90 Minutes Intravenous Every 24 hours 04/19/19 1352 04/20/19 1215   04/20/19 1400  vancomycin (VANCOCIN) 1,250 mg in sodium chloride 0.9 % 250 mL IVPB  Status:  Discontinued     1,250 mg 166.7 mL/hr over 90 Minutes Intravenous Every 24 hours 04/20/19 1215 04/20/19 1220   04/20/19 1400  Vancomycin HCl in Dextrose 1.25-5 GM/250ML-% SOLN 1,250 mg  Status:  Discontinued     1,250 mg 166.7 mL/hr  Intravenous Every 24 hours 04/20/19 1220 04/20/19 1221   04/20/19 1400  Vancomycin '1250mg'$  in D5W  Status:  Discontinued     166.7 mL/hr  Intravenous Continuous 04/20/19 1230 04/20/19 1233   04/20/19 1400  Vancomycin '1250mg'$  in D5W  Status:  Discontinued     166.7 mL/hr  Intravenous Every 24 hours 04/20/19 1233 04/22/19 1324   04/19/19 2200  piperacillin-tazobactam (ZOSYN) IVPB 3.375 g  Status:  Discontinued     3.375 g 12.5 mL/hr over 240 Minutes Intravenous Every 8 hours 04/19/19 1344 04/21/19 1533   04/19/19 1430  vancomycin (VANCOCIN) 2,000 mg in sodium chloride 0.9 % 500 mL IVPB     2,000 mg 250 mL/hr over 120 Minutes Intravenous  Once 04/19/19 1344 04/19/19 1806   04/19/19 1400  piperacillin-tazobactam (ZOSYN) IVPB 3.375 g     3.375 g 100 mL/hr over 30 Minutes Intravenous  Once 04/19/19 1344 04/19/19 1530   04/19/19 1000  azithromycin (ZITHROMAX) tablet 250 mg  Status:  Discontinued     250 mg Per Tube Daily 04/18/19 1246 04/20/19 1655   04/18/19  1300  azithromycin (ZITHROMAX) tablet 500 mg     500 mg Per Tube Daily 04/18/19 1246 04/18/19 1351   04/13/19 1000  remdesivir 100 mg in sodium chloride 0.9 % 250 mL IVPB     100 mg 500 mL/hr over 30 Minutes Intravenous Every 24 hours 04/12/19 0833 04/16/19 1103   04/12/19 1000  remdesivir 200 mg in sodium chloride 0.9 % 250 mL IVPB     200 mg 500 mL/hr over 30 Minutes Intravenous Once 04/12/19 0833 04/12/19 1317   04/12/19 0600  ceFEPIme (MAXIPIME) 2 g in sodium chloride 0.9 % 100 mL IVPB  Status:  Discontinued     2 g 200 mL/hr over 30 Minutes Intravenous Every 8 hours 04/11/19 2253 04/15/19 1452   04/12/19 0200  vancomycin (VANCOCIN)  IVPB 750 mg/150 ml premix  Status:  Discontinued     750 mg 150 mL/hr over 60 Minutes Intravenous Every 12 hours 04/11/19 2253 04/12/19 1455   04/11/19 2130  ceFEPIme (MAXIPIME) 2 g in sodium chloride 0.9 % 100 mL IVPB     2 g 200 mL/hr over 30 Minutes Intravenous  Once 04/11/19 2125 04/11/19 2257   04/11/19 2130  metroNIDAZOLE (FLAGYL) IVPB 500 mg     500 mg 100 mL/hr over 60 Minutes Intravenous  Once 04/11/19 2125 04/11/19 2356   04/11/19 2130  vancomycin (VANCOCIN) IVPB 1000 mg/200 mL premix     1,000 mg 200 mL/hr over 60 Minutes Intravenous  Once 04/11/19 2125 04/12/19 0105       Objective: Physical Exam: Vitals:   04/23/19 1517 04/23/19 1600 04/23/19 1700 04/23/19 1800  BP: (!) 145/69 138/76  139/71  Pulse: (!) 102 (!) 101 (!) 103 (!) 108  Resp: (!) 33 (!) 31 (!) 32 (!) 31  Temp:      TempSrc:      SpO2: 98% 97% 97% 97%  Weight:      Height:        Intake/Output Summary (Last 24 hours) at 04/23/2019 1827 Last data filed at 04/23/2019 1800 Gross per 24 hour  Intake 672.77 ml  Output 975 ml  Net -302.23 ml   Filed Weights   04/20/19 0500 04/21/19 0500 04/23/19 0400  Weight: 104 kg 104.5 kg 104.9 kg   General: obtunded and nonverbal. Appear in moderate distress, affect unresponsive Eyes: PERRL, Conjunctiva normal ENT: Oral Mucosa  Clear, moist  Neck: difficult to assess  JVD, no Abnormal Mass Or lumps Cardiovascular: S1 and S2 Present, no Murmur, peripheral pulses symmetrical Respiratory: increased  respiratory effort, Bilateral Air entry equal and Decreased, no use of accessory muscle, bilateral  Crackles, no wheezes Abdomen: Bowel Sound present, Soft and no tenderness, no hernia Skin: none Extremities: no Pedal edema, no calf tenderness Neurologic: Nonfocal Gait not checked due to patient safety concerns  Data Reviewed: CBC: Recent Labs  Lab 04/19/19 0515  04/20/19 0515 04/21/19 0500 04/21/19 0835 04/22/19 0503 04/23/19 0501  WBC 17.6*  --  12.7* 12.8*  --  15.2* 11.1*  NEUTROABS 14.8*  --  10.1* 10.8*  --  13.0* 9.3*  HGB 14.2   < > 13.3 13.0 12.2* 12.2* 11.9*  HCT 46.0   < > 43.3 43.2 36.0* 40.4 38.4*  MCV 96.6  --  97.1 97.3  --  98.5 95.0  PLT 279  --  171 162  --  133* 122*   < > = values in this interval not displayed.   Basic Metabolic Panel: Recent Labs  Lab 04/19/19 0515  04/20/19 0515 04/20/19 2000 04/21/19 0500 04/21/19 0835 04/22/19 0503 04/23/19 0501  NA 154*   < > 153* 150* 147* 149* 145 147*  K 4.8   < > 5.0 4.6 4.3 4.6 4.4 3.8  CL 122*   < > 119* 118* 114*  --  111 112*  CO2 25   < > 27 25 26   --  27 27  GLUCOSE 303*   < > 247* 246* 173*  --  195* 154*  BUN 93*   < > 79* 72* 67*  --  54* 51*  CREATININE 1.70*   < > 1.47* 1.57* 1.47*  --  1.15 1.19  CALCIUM 8.0*   < > 7.9* 8.1* 8.1*  --  8.1* 8.4*  MG 3.0*  --  2.7*  --  2.7*  --  2.8* 2.7*   < > = values in this interval not displayed.    Liver Function Tests: Recent Labs  Lab 04/19/19 0515 04/20/19 0515 04/21/19 0500 04/22/19 0503 04/23/19 0501  AST 45* 47* 42* 27 27  ALT 75* 83* 92* 67* 56*  ALKPHOS 52 46 58 66 75  BILITOT 0.4 0.7 0.6 0.7 0.8  PROT 5.7* 5.9* 5.7* 5.7* 6.0*  ALBUMIN 2.5* 3.0* 2.8* 2.7* 2.6*   No results for input(s): LIPASE, AMYLASE in the last 168 hours. No results for input(s): AMMONIA in  the last 168 hours. Coagulation Profile: No results for input(s): INR, PROTIME in the last 168 hours. Cardiac Enzymes: No results for input(s): CKTOTAL, CKMB, CKMBINDEX, TROPONINI in the last 168 hours. BNP (last 3 results) No results for input(s): PROBNP in the last 8760 hours. CBG: Recent Labs  Lab 04/22/19 2350 04/23/19 0351 04/23/19 0754 04/23/19 1149 04/23/19 1602  GLUCAP 200* 144* 179* 252* 219*   Studies: Dg Chest Port 1 View  Result Date: 04/23/2019 CLINICAL DATA:  Intubated.  COVID-19 pneumonia. EXAM: PORTABLE CHEST 1 VIEW COMPARISON:  04/19/2019 chest radiograph. FINDINGS: Endotracheal tube tip is 5.0 cm above the carina. Enteric tube enters stomach with the tip not seen on this image. Left subclavian central venous catheter terminates in the middle third of the SVC. Stable cardiomediastinal silhouette with normal heart size. No pneumothorax. No pleural effusion. Patchy hazy mid to lower lung opacities bilaterally not appreciably changed. IMPRESSION: 1. Well-positioned support structures.  No pneumothorax. 2. No significant change in hazy patchy bilateral mid to lower lung opacities compatible with multilobar pneumonia. Electronically Signed   By: Ilona Sorrel M.D.   On: 04/23/2019 15:37   [place] Ekg Site Rite  Result Date: 04/23/2019 If Site Rite image not attached, placement could not be confirmed due to current cardiac rhythm.    Time spent: 35 minutes  Author: Berle Mull, MD [facility] 04/23/2019 6:27 PM  To reach On-call, see care teams to locate the attending and reach out to them via [email or web address]. If 7PM-7AM, please contact night-coverage If you still have difficulty reaching the attending provider, please page the [facility] (Director on Call) for [facility] on amion for assistance.

## 2019-04-23 NOTE — Progress Notes (Addendum)
Nutrition Follow-up  DOCUMENTATION CODES:   Obesity unspecified  INTERVENTION:   Resume TF via Cortrak tube:   Vital AF 1.2 at 40 ml/h, increase by 10 ml every 4 hours to goal rate of 70 ml/h (1680 ml per day).   Pro-stat 30 ml TID.   Provides 2316 kcal, 171 gm protein, 1362 ml free water daily.    NUTRITION DIAGNOSIS:   Inadequate oral intake related to inability to eat as evidenced by NPO status.  Ongoing   GOAL:   Provide needs based on ASPEN/SCCM guidelines  Unmet, TF off  MONITOR:   Vent status, TF tolerance, Labs, Weight trends  ASSESSMENT:   68 yo male admitted with ARDS from COVID-19 pneumonia. PMH includes HLD, anxiety, ischemic cardiomyopathy, CAD, COPD, RA, HTN, DM-on insulin.  Patient remains intubated on ventilator support. ETT and tube holder were both replaced this afternoon.  MV: 15.3 L/min Temp (24hrs), Avg:98.8 F (37.1 C), Min:97.7 F (36.5 C), Max:99.5 F (37.5 C)   Cortrak was placed on 8/7, tip is post-pyloric.  Tube feeding has been off since 8/8. RN just resumed TF today. Free water flushes 200 ml every 8 hours  Labs reviewed. Sodium 147 (H), BUN 51 (H) CBG's: [phone]  Medications reviewed and include folic acid, novolog, lantus, MVI.  I/O Net + 11.1 L Weight is above admission weight by 2.8 kg   Diet Order:   Diet Order            Diet NPO time specified  Diet effective now              EDUCATION NEEDS:   No education needs have been identified at this time  Skin:  Skin Assessment: Skin Integrity Issues: Skin Integrity Issues:: DTI, Unstageable DTI: sacrum, buttocks Unstageable: buttocks  Last BM:  8/12 type 7  Height:   Ht Readings from Last 1 Encounters:  04/12/19 6' (1.829 m)    Weight:   Wt Readings from Last 1 Encounters:  04/23/19 104.9 kg    Ideal Body Weight:  80.9 kg  BMI:  Body mass index is 31.36 kg/m.  Estimated Nutritional Needs:   Kcal:  2300  Protein:  150-175 gm  Fluid:   2 L    Molli Barrows, RD, LDN, [place] Pager [phone] After Hours Pager [phone]

## 2019-04-23 NOTE — Progress Notes (Signed)
CRITICAL VALUE ALERT  Critical Value:  Vanc Trough 36  Date & Time Notied:  04-23-2019 2300  Provider Notified: D. Olevia Bowens  Orders Received/Actions taken: waiting for orders

## 2019-04-23 NOTE — Procedures (Signed)
Intubation (Endotracheal Tube Exchange) Procedure Note Robert [name] [id number] Sep 15, 1950  Procedure: Endotracheal tube exchange Indications: ETT needed to be changed due to blocked tubing  Procedure Details Consent: Unable to obtain consent because of emergent medical necessity. Time Out: Verified patient identification, verified procedure, site/side was marked, verified correct patient position, special equipment/implants available, medications/allergies/relevent history reviewed, required imaging and test results available.  Performed  Maximum sterile technique was used including cap, gloves, gown and hand hygiene.   Using bougie, current ETT withdrawn and 7.5 ETT replaced. No post-procedure complications. Old ETT noted to have significant clot burden secondary to dried bloody secretions. Vitals remained stable. ETCO2 monitor with color change.  Evaluation Hemodynamic Status: BP stable throughout; O2 sats: stable throughout Patient's Current Condition: stable Complications: No apparent complications Patient did tolerate procedure well. Chest X-ray ordered to verify placement.  CXR: tube position acceptable.   Robert [name], M.D. [facility] Pulmonary/Critical Care Medicine 04/23/2019 6:40 PM

## 2019-04-23 NOTE — Progress Notes (Signed)
NAME:  Robert [name], MRN:  [id number], DOB:  1951-05-06, LOS: 40 ADMISSION DATE:  04/11/2019, CONSULTATION DATE:  8/1 REFERRING MD:  Dr. Roderic Palau, CHIEF COMPLAINT:  dyspnea   Brief History   68 year old male admitted on 8/1 in the setting of ARDS from COVID-19 pneumonia. Initially presented to [facility] on 7/31 and required intubation. Post-procedure in the ED, patient awakened and self-extubated and required re-intubation. At [facility], tolerated PS and subsequently extubated on 8/2 however required intubation within 24 hours. Hospital course complicated by septic shock 2/2 MRSA pneumonia and volume overload.   Past Medical History  Anxiety COPD GERD CAD Hypertension Hyperlipidemia Rheumatoid arthritis History of systolic heart failure, however 08/2018 Echo showed LVEF 50-55%, left atrium dilated  Significant Hospital Events   8/1 admission to [facility] ICU 8/2 extubated 8/3 early AM agitated, placed on BIPAP, required re-intubation 8/4 started on steroids for COPD exacerbation 8/8 septic shock requiring pressor support 8/9 off pressors, respiratory cultures +s.aureus 8/10 persistent encephalopathy off sedation, CT head ordered 8/11 encephalopathy improved 8/12 difficulty weaning due to tachypnea, ETT exchanged due to clot burden  Consults:  PCCM  Procedures:  8/1 ETT > 8/2, 8/3 >  8/1 L subclavian CVL >   Significant Diagnostic Tests:  08/2018 Echo> LVEF 50-55%, left atrium dilated  Micro Data:  7/31 blood > NGTD 7/31 sars-cov-2 > positive 8/1 Resp CX > Normal flora 8/7 Resp CX > MRSA 8/7 BCX > NGTD 8/8 UCX > NGTD  Antimicrobials:  Cefepime 7/31 > 8/4  Flagyl 7/31  Vanc 7/31, 8/8>  Remdesivir 8/1 > 8/5  Actemra 8/1  Decadron/solumedrol 8/1 >8/8  Zosyn 8/8>8/9 Azithro 8/7>8/9    Interim history/subjective:    This morning tolerated weaning for one hour on PS 10/8. Discontinued due to tachypnea. On RT exam of ETT, blood clot noted at proximal end. ETT exchange  performed. Refer to procedure note for full details. RR improved post-procedure.  Objective   Blood pressure (!) 145/69, pulse (!) 102, temperature 100 F (37.8 C), resp. rate (!) 33, height 6' (1.829 m), weight 104.9 kg, SpO2 98 %. CVP:  [4 mmHg-5 mmHg] 5 mmHg  Vent Mode: PRVC FiO2 (%):  [35 %-40 %] 35 % Set Rate:  [26 bmp] 26 bmp Vt Set:  [460 mL] 460 mL PEEP:  [5 cmH20] 5 cmH20 Plateau Pressure:  [15 cmH20-21 cmH20] 15 cmH20   Intake/Output Summary (Last 24 hours) at 04/23/2019 1801 Last data filed at 04/23/2019 1500 Gross per 24 hour  Intake 513.27 ml  Output 775 ml  Net -261.73 ml   Filed Weights   04/20/19 0500 04/21/19 0500 04/23/19 0400  Weight: 104 kg 104.5 kg 104.9 kg   Physical Exam: General: Chronically ill-appearing, on minimal vent settings HENT: [place], AT, ETT in place Eyes: EOMI, no scleral icterus Respiratory: Clear to auscultation bilaterally.  No crackles, wheezing or rales Cardiovascular: RRR, -M/R/G, no JVD GI: BS+, soft, nontender Extremities: Anasarca Neuro: Opens eyes to voice, follows simple commands (sticks out tongue, nods head), does not move extremities x 4 GU: Condom cath in place  Resolved Hospital Problem list   Septic shock 2/2 S. Aureus pna  Assessment & Plan:   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: C/b MRSA pneumonia, COPD exacerbation and CFB S/p ETT exchange with improved RR. Mental status remains barrier for extubation.  Plan Continue Vanc Pressure support as tolerated When on full vent support, ARDsnet protocol Titrate PEEP and FiO2 for goal SpO2 88-95% or pO2 55-80 Pulmonary  hygiene including bronchodilator and flutter valve. AVOID manual percussion. Mobilize as tolerated Lasix 40 mg BID today  Acute encephalopathy: Improved. 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, last thrombosis event in 2018 Atrial fibrillation Metoprolol Continue Plavix  Hold treatment dose lovenox in setting of central line bleed  Persistent left subclavian line bleed Administer Thrombipad Remove line after obtaining PICC access  Best practice:  Diet: tube feeding Pain/Anxiety/Delirium protocol (if indicated): as above VAP protocol (if indicated): yes DVT prophylaxis: lovenox GI prophylaxis: Pantoprazole for stress ulcer prophylaxis Glucose control: SSI Mobility: bed rest Code Status: full Family Communication: Per [facility] Disposition: remain in ICU  Labs   CBC: Recent Labs  Lab 04/19/19 0515  04/20/19 0515 04/21/19 0500 04/21/19 0835 04/22/19 0503 04/23/19 0501  WBC 17.6*  --  12.7* 12.8*  --  15.2* 11.1*  NEUTROABS 14.8*  --  10.1* 10.8*  --  13.0* 9.3*  HGB 14.2   < > 13.3 13.0 12.2* 12.2* 11.9*  HCT 46.0   < > 43.3 43.2 36.0* 40.4 38.4*  MCV 96.6  --  97.1 97.3  --  98.5 95.0  PLT 279  --  171 162  --  133* 122*   < > = values in this interval not displayed.    Basic Metabolic Panel: Recent Labs  Lab 04/19/19 0515  04/20/19 0515 04/20/19 2000 04/21/19 0500 04/21/19 0835 04/22/19 0503 04/23/19 0501  NA 154*   < > 153* 150* 147* 149* 145 147*  K 4.8   < > 5.0 4.6 4.3 4.6 4.4 3.8  CL 122*   < > 119* 118* 114*  --  111 112*  CO2 25   < > 27 25 26   --  27 27  GLUCOSE 303*   < > 247* 246* 173*  --  195* 154*  BUN 93*   < > 79* 72* 67*  --  54* 51*  CREATININE 1.70*   < > 1.47* 1.57* 1.47*  --  1.15 1.19  CALCIUM 8.0*   < > 7.9* 8.1* 8.1*  --  8.1* 8.4*  MG 3.0*  --  2.7*  --  2.7*  --  2.8* 2.7*   < > = values in this interval not displayed.   GFR: Estimated Creatinine Clearance: 77.5 mL/min (by C-G formula based on SCr of 1.19 mg/dL). Recent Labs  Lab 04/18/19 2338 04/19/19 0340  04/19/19 1449 04/20/19 0515 04/21/19 0500  04/22/19 0503 04/23/19 0501  WBC  --   --    < >  --  12.7* 12.8* 15.2* 11.1*  LATICACIDVEN 1.6 1.2  --  1.1  --   --   --   --    < > = values in this interval not displayed.    Liver Function Tests: Recent Labs  Lab 04/19/19 0515 04/20/19 0515 04/21/19 0500 04/22/19 0503 04/23/19 0501  AST 45* 47* 42* 27 27  ALT 75* 83* 92* 67* 56*  ALKPHOS 52 46 58 66 75  BILITOT 0.4 0.7 0.6 0.7 0.8  PROT 5.7* 5.9* 5.7* 5.7* 6.0*  ALBUMIN 2.5* 3.0* 2.8* 2.7* 2.6*   No results for input(s): LIPASE, AMYLASE in the last 168 hours. No results for input(s): AMMONIA in  the last 168 hours.  ABG    Component Value Date/Time   PHART 7.348 (L) 04/21/2019 0835   PCO2ART 48.3 (H) 04/21/2019 0835   PO2ART 58.0 (L) 04/21/2019 0835   HCO3 26.6 04/21/2019 0835   TCO2 28 04/21/2019 0835   ACIDBASEDEF 1.0 04/20/2019 0422   O2SAT 88.0 04/21/2019 0835     Coagulation Profile: No results for input(s): INR, PROTIME in the last 168 hours.  Cardiac Enzymes: No results for input(s): CKTOTAL, CKMB, CKMBINDEX, TROPONINI in the last 168 hours.  HbA1C: Hgb A1c MFr Bld  Date/Time Value Ref Range Status  04/11/2019 09:50 PM 7.4 (H) 4.8 - 5.6 % Final    Comment:    (NOTE) Pre diabetes:          5.7%-6.4% Diabetes:              >6.4% Glycemic control for   <7.0% adults with diabetes   06/07/2017 12:02 AM 9.1 (H) 4.8 - 5.6 % Final    Comment:    (NOTE) Pre diabetes:          5.7%-6.4% Diabetes:              >6.4% Glycemic control for   <7.0% adults with diabetes     CBG: Recent Labs  Lab 04/22/19 2350 04/23/19 0351 04/23/19 0754 04/23/19 1149 04/23/19 1602  GLUCAP 200* 144* 179* 252* 219*   COVID-19 Labs  Recent Labs    04/21/19 0500 04/22/19 0503 04/23/19 0501  DDIMER 3.19* 2.18* 1.98*  CRP <0.8 <0.8 1.5*    Lab Results  Component Value Date   SARSCOV2NAA POSITIVE (A) 04/11/2019   SARSCOV2NAA Detected (A) 04/11/2019     Critical care time: 39 minutes    The patient is  critically ill with multiple organ systems failure and requires high complexity decision making for assessment and support, frequent evaluation and titration of therapies, application of advanced monitoring technologies and extensive interpretation of multiple databases.   Discussed and co-managed patient care with PCCM-Hospitalist. Coordinated care with RT, RN and pharmacist.  [name], M.D. [facility] Pulmonary/Critical Care Medicine 04/23/2019 6:35 PM  Pager: [phone] After hours pager: [phone]

## 2019-04-23 NOTE — Progress Notes (Signed)
RT and CCM MD used bougie and exchanged ett due to copious amount of mucous plugs lining ett. VS within normal limits. No distress. Positive color changed noted on etco2, bilateral BS. Tube holder exchanged as well. CXR pending

## 2019-04-23 NOTE — Progress Notes (Signed)
Pharmacy Antibiotic Note  Robert [name] is a 68 y.o. male admitted on 04/11/2019 with pneumonia.  Pharmacy has been consulted for vancomycin dosing.  SCr increased today to 1.77 so vancomycin trough was collected. It came back elevated at 36. Would suspect current half life is ~18-24 hrs.  Plan: Hold vancomycin for now Recheck vancomycin trough tomorrow evening at 2000 Monitor clinical picture, renal function, vanc levels prn  Height: 6' (182.9 cm) Weight: 231 lb 4.2 oz (104.9 kg) IBW/kg (Calculated) : 77.6  Temp (24hrs), Avg:99 F (37.2 C), Min:97.7 F (36.5 C), Max:101.6 F (38.7 C)  Recent Labs  Lab 04/18/19 2338 04/19/19 0340 04/19/19 0515  04/19/19 1449  04/20/19 0515 04/20/19 2000 04/21/19 0500 04/22/19 0503 04/23/19 0501 04/23/19 1739 04/23/19 2100  WBC  --   --  17.6*  --   --   --  12.7*  --  12.8* 15.2* 11.1*  --   --   CREATININE  --  1.64* 1.70*   < >  --    < > 1.47* 1.57* 1.47* 1.15 1.19 1.77*  --   LATICACIDVEN 1.6 1.2  --   --  1.1  --   --   --   --   --   --   --   --   VANCOTROUGH  --   --   --   --   --   --   --   --   --   --   --   --  36*   < > = values in this interval not displayed.    Estimated Creatinine Clearance: 52.1 mL/min (A) (by C-G formula based on SCr of 1.77 mg/dL (H)).    Allergies  Allergen Reactions  . Ivp Dye [Iodinated Diagnostic Agents] Itching and Rash   Thank you for allowing pharmacy to be a part of this patient's care.  Elenor Quinones, PharmD, BCPS, BCIDP Clinical Pharmacist 04/23/2019 11:04 PM

## 2019-04-23 NOTE — Progress Notes (Addendum)
Per pharmacy; will discontinue order for Vancomycin and redraw trough tomorrow evening.

## 2019-04-24 DIAGNOSIS — J1289 Other viral pneumonia: Secondary | ICD-10-CM

## 2019-04-24 LAB — CBC WITH DIFFERENTIAL/PLATELET
Abs Immature Granulocytes: 0.05 10*3/uL (ref 0.00–0.07)
Basophils Absolute: 0 10*3/uL (ref 0.0–0.1)
Basophils Relative: 0 %
Eosinophils Absolute: 0.1 10*3/uL (ref 0.0–0.5)
Eosinophils Relative: 1 %
HCT: 34.6 % — ABNORMAL LOW (ref 39.0–52.0)
Hemoglobin: 11.2 g/dL — ABNORMAL LOW (ref 13.0–17.0)
Immature Granulocytes: 1 %
Lymphocytes Relative: 7 %
Lymphs Abs: 0.7 10*3/uL (ref 0.7–4.0)
MCH: 30.5 pg (ref 26.0–34.0)
MCHC: 32.4 g/dL (ref 30.0–36.0)
MCV: 94.3 fL (ref 80.0–100.0)
Monocytes Absolute: 0.8 10*3/uL (ref 0.1–1.0)
Monocytes Relative: 8 %
Neutro Abs: 7.9 10*3/uL — ABNORMAL HIGH (ref 1.7–7.7)
Neutrophils Relative %: 83 %
Platelets: 115 10*3/uL — ABNORMAL LOW (ref 150–400)
RBC: 3.67 MIL/uL — ABNORMAL LOW (ref 4.22–5.81)
RDW: 16.5 % — ABNORMAL HIGH (ref 11.5–15.5)
WBC: 9.5 10*3/uL (ref 4.0–10.5)
nRBC: 0 % (ref 0.0–0.2)

## 2019-04-24 LAB — POCT I-STAT 7, (LYTES, BLD GAS, ICA,H+H)
Acid-base deficit: 1 mmol/L (ref 0.0–2.0)
Bicarbonate: 23.2 mmol/L (ref 20.0–28.0)
Calcium, Ion: 1.19 mmol/L (ref 1.15–1.40)
HCT: 30 % — ABNORMAL LOW (ref 39.0–52.0)
Hemoglobin: 10.2 g/dL — ABNORMAL LOW (ref 13.0–17.0)
O2 Saturation: 95 %
Patient temperature: 101.7
Potassium: 4.3 mmol/L (ref 3.5–5.1)
Sodium: 147 mmol/L — ABNORMAL HIGH (ref 135–145)
TCO2: 24 mmol/L (ref 22–32)
pCO2 arterial: 36.9 mmHg (ref 32.0–48.0)
pH, Arterial: 7.412 (ref 7.350–7.450)
pO2, Arterial: 84 mmHg (ref 83.0–108.0)

## 2019-04-24 LAB — COMPREHENSIVE METABOLIC PANEL
ALT: 50 U/L — ABNORMAL HIGH (ref 0–44)
AST: 32 U/L (ref 15–41)
Albumin: 2.2 g/dL — ABNORMAL LOW (ref 3.5–5.0)
Alkaline Phosphatase: 75 U/L (ref 38–126)
Anion gap: 10 (ref 5–15)
BUN: 81 mg/dL — ABNORMAL HIGH (ref 8–23)
CO2: 24 mmol/L (ref 22–32)
Calcium: 7.9 mg/dL — ABNORMAL LOW (ref 8.9–10.3)
Chloride: 111 mmol/L (ref 98–111)
Creatinine, Ser: 2.39 mg/dL — ABNORMAL HIGH (ref 0.61–1.24)
GFR calc Af Amer: 32 mL/min — ABNORMAL LOW (ref >60)
GFR calc non Af Amer: 27 mL/min — ABNORMAL LOW (ref >60)
Glucose, Bld: 310 mg/dL — ABNORMAL HIGH (ref 70–99)
Potassium: 4.4 mmol/L (ref 3.5–5.1)
Sodium: 145 mmol/L (ref 135–145)
Total Bilirubin: 0.5 mg/dL (ref 0.3–1.2)
Total Protein: 5 g/dL — ABNORMAL LOW (ref 6.5–8.1)

## 2019-04-24 LAB — GLUCOSE, CAPILLARY
Glucose-Capillary: 277 mg/dL — ABNORMAL HIGH (ref 70–99)
Glucose-Capillary: 300 mg/dL — ABNORMAL HIGH (ref 70–99)
Glucose-Capillary: 362 mg/dL — ABNORMAL HIGH (ref 70–99)
Glucose-Capillary: 335 mg/dL — ABNORMAL HIGH (ref 70–99)
Glucose-Capillary: 352 mg/dL — ABNORMAL HIGH (ref 70–99)

## 2019-04-24 LAB — C-REACTIVE PROTEIN: CRP: 1.4 mg/dL — ABNORMAL HIGH (ref <1.0)

## 2019-04-24 LAB — MAGNESIUM: Magnesium: 3.1 mg/dL — ABNORMAL HIGH (ref 1.7–2.4)

## 2019-04-24 LAB — D-DIMER, QUANTITATIVE: D-Dimer, Quant: 2.3 ug/mL-FEU — ABNORMAL HIGH (ref 0.00–0.50)

## 2019-04-24 MED ORDER — INSULIN ASPART 100 UNIT/ML ~~LOC~~ SOLN
4.0000 [IU] | SUBCUTANEOUS | Status: DC
  Administered 2019-04-24 – 2019-04-26 (×11): 4 [IU] via SUBCUTANEOUS

## 2019-04-24 MED ORDER — CHLORHEXIDINE GLUCONATE CLOTH 2 % EX PADS
6.0000 | MEDICATED_PAD | Freq: Every day | CUTANEOUS | Status: DC
  Administered 2019-04-24 – 2019-05-21 (×27): 6 via TOPICAL

## 2019-04-24 MED ORDER — INSULIN GLARGINE 100 UNIT/ML ~~LOC~~ SOLN
40.0000 [IU] | Freq: Every day | SUBCUTANEOUS | Status: DC
  Filled 2019-04-24: qty 0.4

## 2019-04-24 MED ORDER — "THROMBI-PAD 3""X3"" EX PADS"
1.0000 | MEDICATED_PAD | Freq: Once | CUTANEOUS | Status: DC | PRN
  Filled 2019-04-24: qty 1

## 2019-04-24 MED ORDER — SODIUM CHLORIDE 0.9% FLUSH: 10.0000 mL | INTRAVENOUS | Status: DC | PRN

## 2019-04-24 MED ORDER — INSULIN GLARGINE 100 UNIT/ML ~~LOC~~ SOLN
8.0000 [IU] | Freq: Once | SUBCUTANEOUS | Status: AC
  Administered 2019-04-24: 8 [IU] via SUBCUTANEOUS
  Filled 2019-04-24: qty 0.08

## 2019-04-24 MED ORDER — ACETAMINOPHEN 160 MG/5ML PO SOLN
650.0000 mg | Freq: Once | ORAL | Status: AC
  Administered 2019-04-24: 21:00:00 650 mg via ORAL
  Filled 2019-04-24: qty 20.3

## 2019-04-24 MED ORDER — INSULIN ASPART 100 UNIT/ML ~~LOC~~ SOLN
4.0000 [IU] | Freq: Four times a day (QID) | SUBCUTANEOUS | Status: DC
  Administered 2019-04-24: 4 [IU] via SUBCUTANEOUS

## 2019-04-24 MED ORDER — SODIUM CHLORIDE 0.9% FLUSH
10.0000 mL | Freq: Two times a day (BID) | INTRAVENOUS | Status: DC
  Administered 2019-04-24 (×2): 10 mL
  Administered 2019-04-25: 30 mL
  Administered 2019-04-25 – 2019-05-01 (×11): 10 mL
  Administered 2019-05-01: 20 mL
  Administered 2019-05-02: 10 mL
  Administered 2019-05-03: 20 mL
  Administered 2019-05-03 – 2019-05-21 (×36): 10 mL

## 2019-04-24 NOTE — Progress Notes (Signed)
Spoke with patient's daughter, Zeki Bedrosian, to give update. She really appreciated the call and care. She asked to set up Birthday video call for him at 1830. Will let dayshift RN know.

## 2019-04-24 NOTE — Progress Notes (Signed)
Peripherally Inserted Central Catheter/Midline Placement  The IV Nurse has discussed with the patient and/or persons authorized to consent for the patient, the purpose of this procedure and the potential benefits and risks involved with this procedure.  The benefits include less needle sticks, lab draws from the catheter, and the patient may be discharged home with the catheter. Risks include, but not limited to, infection, bleeding, blood clot (thrombus formation), and puncture of an artery; nerve damage and irregular heartbeat and possibility to perform a PICC exchange if needed/ordered by physician.  Alternatives to this procedure were also discussed.  Bard Power PICC patient education guide, fact sheet on infection prevention and patient information card has been provided to patient /or left at bedside.    PICC/Midline Placement Documentation  PICC Triple Lumen 73/41/93 PICC Right Basilic 39 cm 0 cm (Active)  Indication for Insertion or Continuance of Line Vasoactive infusions 04/24/19 1226  Exposed Catheter (cm) 0 cm 04/24/19 1226  Site Assessment Clean;Dry;Intact 04/24/19 1226  Lumen #1 Status Flushed;Blood return noted 04/24/19 1226  Lumen #2 Status Flushed;Blood return noted 04/24/19 1226  Lumen #3 Status Flushed;Blood return noted 04/24/19 1226  Dressing Type Transparent 04/24/19 1226  Dressing Status Clean;Dry;Intact;Antimicrobial disc in place 04/24/19 1226  Dressing Intervention New dressing 04/24/19 1226  Dressing Change Due 05/01/19 04/24/19 1226   Telephone consent signed by Daughter    Robert [name] 04/24/2019, 12:27 PM

## 2019-04-24 NOTE — Progress Notes (Addendum)
[facility] Progress Note  Patient: Robert [name] UJW:[id number]   PCP: Redmond School, MD DOB: 05-08-1951   DOA: 04/11/2019   DOS: 04/24/2019   Date of Service: the patient was seen and examined on 04/24/2019  Brief hospital course: Pt. with PMH of  COPD, coronary artery disease, diabetes, ischemic cardiomyopathy, rheumatoid arthritis; admitted on 04/11/2019, Presents to the hospital with complaints of progressive worsening shortness of breath. He was mildly febrile and had worsening shortness of breath so he called EMS. In the ER, he was noted to be substantially hypoxic, had mental status changes. He tested positive for COVID-19. He was intubated in the emergency room for persistent hypoxia. He has been transferred to [facility] for further management. Currently further plan is continue with diuresis and SBT.  Subjective: Required an ET tube exchange yesterday due to mucous plugging and clotting.  Continues to have oozing from the subclavian line.  Still febrile.  Weaning.  Assessment and Plan: 1. Vent Mode: PSV;CPAP FiO2 (%):  [35 %] 35 % Set Rate:  [26 bmp] 26 bmp Vt Set:  [460 mL] 460 mL PEEP:  [5 cmH20] 5 cmH20 Pressure Support:  [14 cmH20] 14 cmH20 Plateau Pressure:  [15 cmH20-17 cmH20] 16 cmH20  Sedation with Precedex, Dilaudid, as needed fentanyl.  Acute COVID-19 Viral illness Lab Results  Component Value Date   SARSCOV2NAA POSITIVE (A) 04/11/2019   SARSCOV2NAA Detected (A) 04/11/2019   CXR: hazy bilateral peripheral opacities  Recent Labs    04/22/19 0503 04/23/19 0501 04/24/19 0508 04/24/19 0512  DDIMER 2.18* 1.98*  --  2.30*  CRP <0.8 1.5* 1.4*  --     Tmax last 24 hours: Febrile again T-max 101.7. Oxygen requirements: On vent, on Brovana and Pulmicort.  Antibiotics: On IV vancomycin for MRSA, day 5, started on 04/19/2019. Diuretics: None due to AKI Vitamin C and Zinc: Continue DVT Prophylaxis: Subcutaneous Lovenox currently on hold due to  bleeding and AKI Remdesivir: Completed Steroids: Completed Actemra: X1 off-label use of Actemra: On 04/12/2019. This patient has confirmed COVID-19 in the setting of the ongoing 2020 coronavirus pandemic.  He has hypoxia and is high-risk for intubation, but expected to survive >48 hours and has good baseline functional status.  he is not known to be on immunomodulators, anti-rejection medications, or cancer chemotherapy, has no history of TB or latent TB, and no history of diverticulitis or intestinal perforation.  Platelets are >50K, ANC is >500, and ALT/AST are below 5x ULN with no known hepatitis B infection. The investigational nature of this medication was discussed with the patient/HCPOA and they choose to proceed as the potential benefits are felt to outweigh risks at this time.   Prone positioning: Patient encouraged to stay in prone position as much as possible.  PPE During this encounter: Patient Isolation: Airborne + Droplet + Contact HCP PPE: CAPR, gown. gloves Patient PPE: None  The treatment plan and use of medications and known side effects were discussed with patient/family. It was clearly explained that there is no proven definitive treatment for COVID-19 infection yet. Any medications used here are based on case reports/anecdotal data which are not peer-reviewed and has not been studied using randomized control trials.  Complete risks and long-term side effects are unknown, however in the best clinical judgment they seem to be of some clinical benefit rather than medical risks.  Patient/family agree with the treatment plan and want to receive these treatments as indicated.   2.  MRSA healthcare associated pneumonia. Septic shock.  POA Required pressors now off. Started on IV vancomycin and Zosyn and azithromycin from 04/19/2019. Sputum culture came back positive for MRSA. Blood cultures so far negative. Currently only on IV vancomycin.  Currently on day 5.  Will complete for 2 more  days. Continue for now.  3.  Paroxysmal A. fib with RVR. Currently in normal sinus rhythm. On metoprolol 25 mg twice daily. [place] Vasc score of 5.   Patient was on IV heparin.  Currently anticoagulation is on hold due to ET bleeding. Continue to monitor.  4.  Acute on chronic diastolic CHF. Essential hypertension. History of CAD Continue Lopressor. Continue IV diuresis. PCI 2018.  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 Discussed with cardiology on 04/24/2019.  Given active bleeding from subclavian line as well as ET tube currently holding off on anticoagulation. Resume aspirin and if no further bleeding add Plavix in 2 to 3 days.  5.  Endotracheal tube clotting. Bleeding from the subclavian line. Patient was on therapeutic heparin. ET tube was clotted.  Exchange on 04/23/2019. Currently no anticoagulation. Continue SCDs H&H relatively stable for now.  6.  Hypernatremia.  Hyperchloremia Currently resolved. Continue free water.  Stop D5. Monitor.  7.  Acute kidney injury. Likely combination of diuresis as well as vancomycin induced injury. Baseline serum creatinine 0.80. Progressively worsened during the hospital course. Normalized again around 04/22/2019. Worsened to 2.39 with BUN of 81 on 04/24/2019. Currently holding diuresis. Also monitoring vancomycin dose.  8.  Type 2 diabetes uncontrolled with hyperglycemia. With vascular disease and neuropathy at baseline. On gabapentin at home. Glipizide acarbose metformin 1000 mg twice daily at home. Currently hyperglycemic in the hospital. Added insulin 4 units every 4 hours. Lantus 40 units daily. Insulin resistance every 4 hours.  9.  Rheumatoid arthritis. Patient is on 6 Actemra and methotrexate at home. Currently on hold.  10. Pressure ulcer medial sacrum bilateral buttocks.  Identified on 04/17/2019 not POA per documentation. Continue frequent dressing changes.  Pressure Injury 04/17/19 Sacrum Medial Deep Tissue  Injury - Purple or maroon localized area of discolored intact skin or blood-filled blister due to damage of underlying soft tissue from pressure and/or shear. dark purple with some skin sloughing on lowe (Active)  04/17/19 0730  Location: Sacrum  Location Orientation: Medial  Staging: Deep Tissue Injury - Purple or maroon localized area of discolored intact skin or blood-filled blister due to damage of underlying soft tissue from pressure and/or shear.  Wound Description (Comments): dark purple with some skin sloughing on lower sacral area  Present on Admission: No     Pressure Injury 04/17/19 Buttocks Right;Lower Unstageable - Full thickness tissue loss in which the base of the ulcer is covered by slough (yellow, tan, gray, green or brown) and/or eschar (tan, brown or black) in the wound bed. (Active)  04/17/19 0730  Location: Buttocks  Location Orientation: Right;Lower  Staging: Unstageable - Full thickness tissue loss in which the base of the ulcer is covered by slough (yellow, tan, gray, green or brown) and/or eschar (tan, brown or black) in the wound bed.  Wound Description (Comments):   Present on Admission: No     Pressure Injury 04/17/19 Buttocks Left;Lower Deep Tissue Injury - Purple or maroon localized area of discolored intact skin or blood-filled blister due to damage of underlying soft tissue from pressure and/or shear. (Active)  04/17/19 0730  Location: Buttocks  Location Orientation: Left;Lower  Staging: Deep Tissue Injury - Purple or maroon localized area of discolored intact skin or blood-filled blister  due to damage of underlying soft tissue from pressure and/or shear.  Wound Description (Comments):   Present on Admission: No     Diet: NPO  DVT Prophylaxis: SCD, pharmacological prophylaxis contraindicated due to ET tube bleeding  Advance goals of care discussion: Full code  Family Communication: no family was present at bedside, at the time of interview.  Discussed  with family on 04/23/2019, opportunity was given to ask question and all questions were answered satisfactorily.   Disposition:  Discharge to to be determined .  Consultants: PCCM  Procedures: none  Scheduled Meds: . arformoterol  15 mcg Nebulization BID  . budesonide (PULMICORT) nebulizer solution  0.5 mg Nebulization BID  . chlorhexidine gluconate (MEDLINE KIT)  15 mL Mouth Rinse BID  . Chlorhexidine Gluconate Cloth  6 each Topical Daily  . Chlorhexidine Gluconate Cloth  6 each Topical Daily  . feeding supplement (PRO-STAT SUGAR FREE 64)  30 mL Per Tube TID  . folic acid  1 mg Per Tube Daily  . free water  200 mL Per Tube Q8H  . insulin aspart  0-20 Units Subcutaneous Q4H  . insulin aspart  4 Units Subcutaneous Q4H  . [START ON 04/25/2019] insulin glargine  40 Units Subcutaneous Daily  . ipratropium-albuterol  3 mL Nebulization Q6H  . mouth rinse  15 mL Mouth Rinse 10 times per day  . metoprolol tartrate  25 mg Per Tube BID  . multivitamin  15 mL Per Tube Daily  . pantoprazole sodium  40 mg Per Tube Daily  . sodium chloride flush  10-40 mL Intracatheter Q12H  . vancomycin variable dose per unstable renal function (pharmacist dosing)   Does not apply See admin instructions   Continuous Infusions: . dexmedetomidine (PRECEDEX) IV infusion 0.3 mcg/kg/hr (04/24/19 1400)  . feeding supplement (VITAL AF 1.2 CAL) 70 mL/hr at 04/24/19 1400   PRN Meds: acetaminophen, dextrose, fentaNYL (SUBLIMAZE) injection, hydrALAZINE, HYDROmorphone, sodium chloride flush, Thrombi-Pad Antibiotics: Anti-infectives (From admission, onward)   Start     Dose/Rate Route Frequency Ordered Stop   04/23/19 1921  vancomycin variable dose per unstable renal function (pharmacist dosing)      Does not apply See admin instructions 04/23/19 1921     04/22/19 2200  dextrose 5 % 250 mL with vancomycin (VANCOCIN) 1,250 mg infusion  Status:  Discontinued     166.7 mL/hr  Intravenous Every 12 hours 04/22/19 1324 04/23/19  1921   04/20/19 1400  vancomycin (VANCOCIN) 1,250 mg in sodium chloride 0.9 % 250 mL IVPB  Status:  Discontinued     1,250 mg 166.7 mL/hr over 90 Minutes Intravenous Every 24 hours 04/19/19 1352 04/20/19 1215   04/20/19 1400  vancomycin (VANCOCIN) 1,250 mg in sodium chloride 0.9 % 250 mL IVPB  Status:  Discontinued     1,250 mg 166.7 mL/hr over 90 Minutes Intravenous Every 24 hours 04/20/19 1215 04/20/19 1220   04/20/19 1400  Vancomycin HCl in Dextrose 1.25-5 GM/250ML-% SOLN 1,250 mg  Status:  Discontinued     1,250 mg 166.7 mL/hr  Intravenous Every 24 hours 04/20/19 1220 04/20/19 1221   04/20/19 1400  Vancomycin 1266m in D5W  Status:  Discontinued     166.7 mL/hr  Intravenous Continuous 04/20/19 1230 04/20/19 1233   04/20/19 1400  Vancomycin 12518min D5W  Status:  Discontinued     166.7 mL/hr  Intravenous Every 24 hours 04/20/19 1233 04/22/19 1324   04/19/19 2200  piperacillin-tazobactam (ZOSYN) IVPB 3.375 g  Status:  Discontinued  3.375 g 12.5 mL/hr over 240 Minutes Intravenous Every 8 hours 04/19/19 1344 04/21/19 1533   04/19/19 1430  vancomycin (VANCOCIN) 2,000 mg in sodium chloride 0.9 % 500 mL IVPB     2,000 mg 250 mL/hr over 120 Minutes Intravenous  Once 04/19/19 1344 04/19/19 1806   04/19/19 1400  piperacillin-tazobactam (ZOSYN) IVPB 3.375 g     3.375 g 100 mL/hr over 30 Minutes Intravenous  Once 04/19/19 1344 04/19/19 1530   04/19/19 1000  azithromycin (ZITHROMAX) tablet 250 mg  Status:  Discontinued     250 mg Per Tube Daily 04/18/19 1246 04/20/19 1655   04/18/19 1300  azithromycin (ZITHROMAX) tablet 500 mg     500 mg Per Tube Daily 04/18/19 1246 04/18/19 1351   04/13/19 1000  remdesivir 100 mg in sodium chloride 0.9 % 250 mL IVPB     100 mg 500 mL/hr over 30 Minutes Intravenous Every 24 hours 04/12/19 0833 04/16/19 1103   04/12/19 1000  remdesivir 200 mg in sodium chloride 0.9 % 250 mL IVPB     200 mg 500 mL/hr over 30 Minutes Intravenous Once 04/12/19 0833 04/12/19  1317   04/12/19 0600  ceFEPIme (MAXIPIME) 2 g in sodium chloride 0.9 % 100 mL IVPB  Status:  Discontinued     2 g 200 mL/hr over 30 Minutes Intravenous Every 8 hours 04/11/19 2253 04/15/19 1452   04/12/19 0200  vancomycin (VANCOCIN) IVPB 750 mg/150 ml premix  Status:  Discontinued     750 mg 150 mL/hr over 60 Minutes Intravenous Every 12 hours 04/11/19 2253 04/12/19 1455   04/11/19 2130  ceFEPIme (MAXIPIME) 2 g in sodium chloride 0.9 % 100 mL IVPB     2 g 200 mL/hr over 30 Minutes Intravenous  Once 04/11/19 2125 04/11/19 2257   04/11/19 2130  metroNIDAZOLE (FLAGYL) IVPB 500 mg     500 mg 100 mL/hr over 60 Minutes Intravenous  Once 04/11/19 2125 04/11/19 2356   04/11/19 2130  vancomycin (VANCOCIN) IVPB 1000 mg/200 mL premix     1,000 mg 200 mL/hr over 60 Minutes Intravenous  Once 04/11/19 2125 04/12/19 0105       Objective: Physical Exam: Vitals:   04/24/19 1000 04/24/19 1100 04/24/19 1200 04/24/19 1252  BP: 135/77 131/73 134/76   Pulse: 96 91 92 94  Resp: (!) 22 (!) 26 (!) 27 (!) 26  Temp:      TempSrc:      SpO2: 97% 97% 97% 96%  Weight:      Height:        Intake/Output Summary (Last 24 hours) at 04/24/2019 1449 Last data filed at 04/24/2019 1400 Gross per 24 hour  Intake 1958.9 ml  Output 1255 ml  Net 703.9 ml   Filed Weights   04/21/19 0500 04/23/19 0400 04/24/19 0500  Weight: 104.5 kg 104.9 kg 104.5 kg   General: obtunded and nonverbal. Appear in moderate distress, affect unresponsive Eyes: PERRL, Conjunctiva normal ENT: Oral Mucosa Clear, moist  Neck: difficult to assess  JVD, no Abnormal Mass Or lumps Cardiovascular: S1 and S2 Present, no Murmur, peripheral pulses symmetrical Respiratory: increased  respiratory effort, Bilateral Air entry equal and Decreased, no use of accessory muscle, bilateral  Crackles, no wheezes Abdomen: Bowel Sound present, Soft and no tenderness, no hernia Skin: none Extremities: no Pedal edema, no calf tenderness Neurologic: Nonfocal  Gait not checked due to patient safety concerns  Data Reviewed: CBC: Recent Labs  Lab 04/20/19 0515 04/21/19 0500 04/21/19 0835 04/22/19 0503  04/23/19 0501 04/24/19 0424 04/24/19 0512  WBC 12.7* 12.8*  --  15.2* 11.1*  --  9.5  NEUTROABS 10.1* 10.8*  --  13.0* 9.3*  --  7.9*  HGB 13.3 13.0 12.2* 12.2* 11.9* 10.2* 11.2*  HCT 43.3 43.2 36.0* 40.4 38.4* 30.0* 34.6*  MCV 97.1 97.3  --  98.5 95.0  --  94.3  PLT 171 162  --  133* 122*  --  170*   Basic Metabolic Panel: Recent Labs  Lab 04/20/19 0515  04/21/19 0500  04/22/19 0503 04/23/19 0501 04/23/19 1739 04/24/19 0424 04/24/19 0512  NA 153*   < > 147*   < > 145 147* 146* 147* 145  K 5.0   < > 4.3   < > 4.4 3.8 4.0 4.3 4.4  CL 119*   < > 114*  --  111 112* 112*  --  111  CO2 27   < > 26  --  27 27 22   --  24  GLUCOSE 247*   < > 173*  --  195* 154* 256*  --  310*  BUN 79*   < > 67*  --  54* 51* 66*  --  81*  CREATININE 1.47*   < > 1.47*  --  1.15 1.19 1.77*  --  2.39*  CALCIUM 7.9*   < > 8.1*  --  8.1* 8.4* 8.2*  --  7.9*  MG 2.7*  --  2.7*  --  2.8* 2.7*  --   --  3.1*   < > = values in this interval not displayed.    Liver Function Tests: Recent Labs  Lab 04/20/19 0515 04/21/19 0500 04/22/19 0503 04/23/19 0501 04/24/19 0512  AST 47* 42* 27 27 32  ALT 83* 92* 67* 56* 50*  ALKPHOS 46 58 66 75 75  BILITOT 0.7 0.6 0.7 0.8 0.5  PROT 5.9* 5.7* 5.7* 6.0* 5.0*  ALBUMIN 3.0* 2.8* 2.7* 2.6* 2.2*   No results for input(s): LIPASE, AMYLASE in the last 168 hours. No results for input(s): AMMONIA in the last 168 hours. Coagulation Profile: No results for input(s): INR, PROTIME in the last 168 hours. Cardiac Enzymes: No results for input(s): CKTOTAL, CKMB, CKMBINDEX, TROPONINI in the last 168 hours. BNP (last 3 results) No results for input(s): PROBNP in the last 8760 hours. CBG: Recent Labs  Lab 04/23/19 1943 04/23/19 2316 04/24/19 0422 04/24/19 0740 04/24/19 1214  GLUCAP 243* 308* 277* 300* 362*   Studies:  Dg Chest Port 1 View  Result Date: 04/23/2019 CLINICAL DATA:  Intubated.  COVID-19 pneumonia. EXAM: PORTABLE CHEST 1 VIEW COMPARISON:  04/19/2019 chest radiograph. FINDINGS: Endotracheal tube tip is 5.0 cm above the carina. Enteric tube enters stomach with the tip not seen on this image. Left subclavian central venous catheter terminates in the middle third of the SVC. Stable cardiomediastinal silhouette with normal heart size. No pneumothorax. No pleural effusion. Patchy hazy mid to lower lung opacities bilaterally not appreciably changed. IMPRESSION: 1. Well-positioned support structures.  No pneumothorax. 2. No significant change in hazy patchy bilateral mid to lower lung opacities compatible with multilobar pneumonia. Electronically Signed   By: Ilona Sorrel M.D.   On: 04/23/2019 15:37   [place] Ekg Site Rite  Result Date: 04/23/2019 If Site Rite image not attached, placement could not be confirmed due to current cardiac rhythm.    Time spent: 35 minutes  Author: Berle Mull, MD [facility] 04/24/2019 2:49 PM  To reach On-call, see care teams to  locate the attending and reach out to them via [email or web address]. If 7PM-7AM, please contact night-coverage If you still have difficulty reaching the attending provider, please page the [facility] (Director on Call) for [facility] on amion for assistance.

## 2019-04-24 NOTE — Progress Notes (Signed)
NAME:  Robert [name], MRN:  [id number], DOB:  Sep 28, 1950, LOS: 71 ADMISSION DATE:  04/11/2019, CONSULTATION DATE:  8/1 REFERRING MD:  Dr. Roderic Palau, CHIEF COMPLAINT:  dyspnea   Brief History   68 year old male admitted on 8/1 in the setting of ARDS from COVID-19 pneumonia. Initially presented to [facility] on 7/31 and required intubation. Post-procedure in the ED, patient awakened and self-extubated and required re-intubation. At [facility], tolerated PS and subsequently extubated on 8/2 however required intubation within 24 hours. Hospital course complicated by septic shock 2/2 MRSA pneumonia and volume overload.   Past Medical History  Anxiety COPD GERD CAD Hypertension Hyperlipidemia Rheumatoid arthritis History of systolic heart failure, however 08/2018 Echo showed LVEF 50-55%, left atrium dilated  Significant Hospital Events   8/1 admission to [facility] ICU 8/2 extubated 8/3 early AM agitated, placed on BIPAP, required re-intubation 8/4 started on steroids for COPD exacerbation 8/8 septic shock requiring pressor support 8/9 off pressors, respiratory cultures +s.aureus 8/10 persistent encephalopathy off sedation, CT head ordered 8/11 encephalopathy improved 8/12 difficulty weaning due to tachypnea, ETT exchanged due to clot burden  Consults:  PCCM  Procedures:  8/1 ETT > 8/2, 8/3 >  8/1 L subclavian CVL >   Significant Diagnostic Tests:  08/2018 Echo> LVEF 50-55%, left atrium dilated  Micro Data:  7/31 blood > NGTD 7/31 sars-cov-2 > positive 8/1 Resp CX > Normal flora 8/7 Resp CX > MRSA 8/7 BCX > NGTD 8/8 UCX > NGTD  Antimicrobials:  Cefepime 7/31 > 8/4  Flagyl 7/31  Vanc 7/31, 8/8>  Remdesivir 8/1 > 8/5  Actemra 8/1  Decadron/solumedrol 8/1 >8/8  Zosyn 8/8>8/9 Azithro 8/7>8/9   Interim history/subjective:  ET tube exchanged yesterday.  On pressure support weans today morning.  Objective   Blood pressure 140/75, pulse (!) 102, temperature (!) 101.7 F (38.7 C),  temperature source Axillary, resp. rate (!) 29, height 6' (1.829 m), weight 104.5 kg, SpO2 95 %. CVP:  [5 mmHg-12 mmHg] 5 mmHg  Vent Mode: PRVC FiO2 (%):  [35 %] 35 % Set Rate:  [26 bmp] 26 bmp Vt Set:  [460 mL] 460 mL PEEP:  [5 cmH20] 5 cmH20 Plateau Pressure:  [15 cmH20-18 cmH20] 15 cmH20   Intake/Output Summary (Last 24 hours) at 04/24/2019 7989 Last data filed at 04/24/2019 0600 Gross per 24 hour  Intake 1701.38 ml  Output 975 ml  Net 726.38 ml   Filed Weights   04/21/19 0500 04/23/19 0400 04/24/19 0500  Weight: 104.5 kg 104.9 kg 104.5 kg   Physical Exam: Gen:      No acute distress HEENT:  EOMI, sclera anicteric Neck:     No masses; no thyromegaly, ET tube Lungs:    Clear to auscultation bilaterally; normal respiratory effort CV:         Regular rate and rhythm; no murmurs Abd:      + bowel sounds; soft, non-tender; no palpable masses, no distension Ext:    2+ edema; adequate peripheral perfusion Skin:      Warm and dry; no rash Neuro: Somnolent, unresponsive  Resolved Hospital Problem list   Septic shock 2/2 S. Aureus pna  Assessment & Plan:   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: C/b MRSA pneumonia, COPD exacerbation and CFB S/p ETT exchange with improved RR. Mental status remains barrier for extubation.  Plan Continue vancomycin Pressure support weans as tolerated Bronchodilators, flutter valve Hold Lasix as creatinine is higher.  Acute encephalopathy: Improved. CT NAICA Continue Precedex PRN fentanyl, Dilaudid.  Non-sustained VT: no episodes x 72 hours Telemetry monitoring  Hypernatremia AKI Free water Monitor creatinine.   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  Best practice:   Diet: tube feeding Pain/Anxiety/Delirium protocol (if indicated): as above VAP protocol (if indicated): yes DVT prophylaxis: lovenox (held) GI prophylaxis: Pantoprazole for stress ulcer prophylaxis Glucose control: SSI Mobility: bed rest Code Status: full Family Communication: Per TRH Disposition: remain in ICU  Labs   CBC: Recent Labs  Lab 04/20/19 0515 04/21/19 0500 04/21/19 0835 04/22/19 0503 04/23/19 0501 04/24/19 0424 04/24/19 0512  WBC 12.7* 12.8*  --  15.2* 11.1*  --  9.5  NEUTROABS 10.1* 10.8*  --  13.0* 9.3*  --  7.9*  HGB 13.3 13.0 12.2* 12.2* 11.9* 10.2* 11.2*  HCT 43.3 43.2 36.0* 40.4 38.4* 30.0* 34.6*  MCV 97.1 97.3  --  98.5 95.0  --  94.3  PLT 171 162  --  133* 122*  --  115*    Basic Metabolic Panel: Recent Labs  Lab 04/20/19 0515 04/20/19 2000 04/21/19 0500 04/21/19 0835 04/22/19 0503 04/23/19 0501 04/23/19 1739 04/24/19 0424 04/24/19 0512  NA 153* 150* 147* 149* 145 147* 146* 147*  --   K 5.0 4.6 4.3 4.6 4.4 3.8 4.0 4.3  --   CL 119* 118* 114*  --  111 112* 112*  --   --   CO2 27 25 26   --  27 27 22   --   --   GLUCOSE 247* 246* 173*  --  195* 154* 256*  --   --   BUN 79* 72* 67*  --  54* 51* 66*  --   --   CREATININE 1.47* 1.57* 1.47*  --  1.15 1.19 1.77*  --   --   CALCIUM 7.9* 8.1* 8.1*  --  8.1* 8.4* 8.2*  --   --   MG 2.7*  --  2.7*  --  2.8* 2.7*  --   --  3.1*   GFR: Estimated Creatinine Clearance: 52 mL/min (A) (by C-G formula based on SCr of 1.77 mg/dL (H)). Recent Labs  Lab 04/18/19 2338 04/19/19 0340  04/19/19 1449  04/21/19 0500 04/22/19 0503 04/23/19 0501 04/24/19 0512  WBC  --   --    < >  --    < > 12.8* 15.2* 11.1* 9.5  LATICACIDVEN 1.6 1.2  --  1.1  --   --   --   --   --    < > = values in this interval not displayed.    Liver Function Tests: Recent Labs  Lab 04/19/19 0515 04/20/19 0515 04/21/19 0500 04/22/19 0503 04/23/19 0501  AST 45* 47* 42* 27 27  ALT 75* 83* 92* 67* 56*  ALKPHOS 52 46 58 66 75   BILITOT 0.4 0.7 0.6 0.7 0.8  PROT 5.7* 5.9* 5.7* 5.7* 6.0*  ALBUMIN 2.5* 3.0* 2.8* 2.7* 2.6*   No results for input(s): LIPASE, AMYLASE in the last 168 hours. No results for input(s): AMMONIA in the last 168 hours.  ABG    Component Value Date/Time   PHART 7.412 04/24/2019 0424   PCO2ART 36.9 04/24/2019 0424   PO2ART 84.0 04/24/2019 0424   HCO3 23.2 04/24/2019 0424   TCO2 24 04/24/2019 0424  ACIDBASEDEF 1.0 04/24/2019 0424   O2SAT 95.0 04/24/2019 0424     Coagulation Profile: No results for input(s): INR, PROTIME in the last 168 hours.  Cardiac Enzymes: No results for input(s): CKTOTAL, CKMB, CKMBINDEX, TROPONINI in the last 168 hours.  HbA1C: Hgb A1c MFr Bld  Date/Time Value Ref Range Status  04/11/2019 09:50 PM 7.4 (H) 4.8 - 5.6 % Final    Comment:    (NOTE) Pre diabetes:          5.7%-6.4% Diabetes:              >6.4% Glycemic control for   <7.0% adults with diabetes   06/07/2017 12:02 AM 9.1 (H) 4.8 - 5.6 % Final    Comment:    (NOTE) Pre diabetes:          5.7%-6.4% Diabetes:              >6.4% Glycemic control for   <7.0% adults with diabetes     CBG: Recent Labs  Lab 04/23/19 1602 04/23/19 1943 04/23/19 2316 04/24/19 0422 04/24/19 0740  GLUCAP 219* 243* 308* 277* 300*   COVID-19 Labs  Recent Labs    04/22/19 0503 04/23/19 0501 04/24/19 0508 04/24/19 0512  DDIMER 2.18* 1.98*  --  2.30*  CRP <0.8 1.5* 1.4*  --     Lab Results  Component Value Date   SARSCOV2NAA POSITIVE (A) 04/11/2019   SARSCOV2NAA Detected (A) 04/11/2019    The patient is critically ill with multiple organ system failure and requires high complexity decision making for assessment and support, frequent evaluation and titration of therapies, advanced monitoring, review of radiographic studies and interpretation of complex data.   Critical Care Time devoted to patient care services, exclusive of separately billable procedures, described in this note is 35  minutes.    Marshell Garfinkel MD [place] [facility] Pager [phone] If no answer call [phone] 04/24/2019, 8:16 AM

## 2019-04-24 NOTE — Progress Notes (Addendum)
Temp 101.2. Last tylenol given 1700 - too early to given another dose. Paged Dr. Olevia Bowens. One-time extra dose 650mg  tylenol ordered and given. Will continue to monitor.

## 2019-04-25 ENCOUNTER — Inpatient Hospital Stay (HOSPITAL_COMMUNITY): Payer: Medicare HMO

## 2019-04-25 LAB — COMPREHENSIVE METABOLIC PANEL
ALT: 54 U/L — ABNORMAL HIGH (ref 0–44)
AST: 31 U/L (ref 15–41)
Albumin: 2.2 g/dL — ABNORMAL LOW (ref 3.5–5.0)
Alkaline Phosphatase: 58 U/L (ref 38–126)
Anion gap: 8 (ref 5–15)
BUN: 89 mg/dL — ABNORMAL HIGH (ref 8–23)
CO2: 25 mmol/L (ref 22–32)
Calcium: 8 mg/dL — ABNORMAL LOW (ref 8.9–10.3)
Chloride: 118 mmol/L — ABNORMAL HIGH (ref 98–111)
Creatinine, Ser: 2.06 mg/dL — ABNORMAL HIGH (ref 0.61–1.24)
GFR calc Af Amer: 38 mL/min — ABNORMAL LOW (ref >60)
GFR calc non Af Amer: 33 mL/min — ABNORMAL LOW (ref >60)
Glucose, Bld: 259 mg/dL — ABNORMAL HIGH (ref 70–99)
Potassium: 4.1 mmol/L (ref 3.5–5.1)
Sodium: 151 mmol/L — ABNORMAL HIGH (ref 135–145)
Total Bilirubin: 0.4 mg/dL (ref 0.3–1.2)
Total Protein: 5.2 g/dL — ABNORMAL LOW (ref 6.5–8.1)

## 2019-04-25 LAB — CBC WITH DIFFERENTIAL/PLATELET
Abs Immature Granulocytes: 0.06 10*3/uL (ref 0.00–0.07)
Basophils Absolute: 0 10*3/uL (ref 0.0–0.1)
Basophils Relative: 0 %
Eosinophils Absolute: 0.1 10*3/uL (ref 0.0–0.5)
Eosinophils Relative: 2 %
HCT: 32.5 % — ABNORMAL LOW (ref 39.0–52.0)
Hemoglobin: 10.2 g/dL — ABNORMAL LOW (ref 13.0–17.0)
Immature Granulocytes: 1 %
Lymphocytes Relative: 9 %
Lymphs Abs: 0.6 10*3/uL — ABNORMAL LOW (ref 0.7–4.0)
MCH: 30.1 pg (ref 26.0–34.0)
MCHC: 31.4 g/dL (ref 30.0–36.0)
MCV: 95.9 fL (ref 80.0–100.0)
Monocytes Absolute: 0.6 10*3/uL (ref 0.1–1.0)
Monocytes Relative: 9 %
Neutro Abs: 5.4 10*3/uL (ref 1.7–7.7)
Neutrophils Relative %: 79 %
Platelets: 110 10*3/uL — ABNORMAL LOW (ref 150–400)
RBC: 3.39 MIL/uL — ABNORMAL LOW (ref 4.22–5.81)
RDW: 17 % — ABNORMAL HIGH (ref 11.5–15.5)
WBC: 6.8 10*3/uL (ref 4.0–10.5)
nRBC: 0 % (ref 0.0–0.2)

## 2019-04-25 LAB — D-DIMER, QUANTITATIVE: D-Dimer, Quant: 2.65 ug/mL-FEU — ABNORMAL HIGH (ref 0.00–0.50)

## 2019-04-25 LAB — URINALYSIS, COMPLETE (UACMP) WITH MICROSCOPIC
Bilirubin Urine: NEGATIVE
Glucose, UA: 150 mg/dL — AB
Ketones, ur: NEGATIVE mg/dL
Leukocytes,Ua: NEGATIVE
Nitrite: NEGATIVE
Protein, ur: 30 mg/dL — AB
RBC / HPF: >50 RBC/hpf — ABNORMAL HIGH (ref 0–5)
Specific Gravity, Urine: 1.015 (ref 1.005–1.030)
pH: 5 (ref 5.0–8.0)

## 2019-04-25 LAB — GLUCOSE, CAPILLARY
Glucose-Capillary: 218 mg/dL — ABNORMAL HIGH (ref 70–99)
Glucose-Capillary: 257 mg/dL — ABNORMAL HIGH (ref 70–99)
Glucose-Capillary: 259 mg/dL — ABNORMAL HIGH (ref 70–99)
Glucose-Capillary: 261 mg/dL — ABNORMAL HIGH (ref 70–99)
Glucose-Capillary: 279 mg/dL — ABNORMAL HIGH (ref 70–99)
Glucose-Capillary: 312 mg/dL — ABNORMAL HIGH (ref 70–99)
Glucose-Capillary: 327 mg/dL — ABNORMAL HIGH (ref 70–99)

## 2019-04-25 LAB — MAGNESIUM: Magnesium: 2.8 mg/dL — ABNORMAL HIGH (ref 1.7–2.4)

## 2019-04-25 LAB — PROCALCITONIN: Procalcitonin: 0.7 ng/mL

## 2019-04-25 LAB — C-REACTIVE PROTEIN: CRP: 0.8 mg/dL (ref <1.0)

## 2019-04-25 LAB — VANCOMYCIN, RANDOM: Vancomycin Rm: 18

## 2019-04-25 MED ORDER — VANCOMYCIN HCL IN DEXTROSE 1-5 GM/200ML-% IV SOLN
1000.0000 mg | Freq: Once | INTRAVENOUS | Status: AC
  Administered 2019-04-25: 1000 mg via INTRAVENOUS
  Filled 2019-04-25: qty 200

## 2019-04-25 MED ORDER — IPRATROPIUM-ALBUTEROL 0.5-2.5 (3) MG/3ML IN SOLN
3.0000 mL | Freq: Three times a day (TID) | RESPIRATORY_TRACT | Status: DC
  Administered 2019-04-25 – 2019-04-29 (×13): 3 mL via RESPIRATORY_TRACT
  Filled 2019-04-25 (×13): qty 3

## 2019-04-25 MED ORDER — ASPIRIN 81 MG PO CHEW
81.0000 mg | CHEWABLE_TABLET | Freq: Every day | ORAL | Status: DC
  Administered 2019-04-25 – 2019-05-05 (×11): 81 mg
  Filled 2019-04-25 (×11): qty 1

## 2019-04-25 MED ORDER — SODIUM CHLORIDE 0.9 % IV SOLN
2.0000 g | Freq: Two times a day (BID) | INTRAVENOUS | Status: DC
  Administered 2019-04-25 – 2019-04-27 (×5): 2 g via INTRAVENOUS
  Filled 2019-04-25 (×5): qty 2

## 2019-04-25 MED ORDER — INSULIN GLARGINE 100 UNIT/ML ~~LOC~~ SOLN
30.0000 [IU] | Freq: Two times a day (BID) | SUBCUTANEOUS | Status: DC
  Administered 2019-04-25 (×2): 30 [IU] via SUBCUTANEOUS
  Filled 2019-04-25 (×4): qty 0.3

## 2019-04-25 MED ORDER — FREE WATER
200.0000 mL | Status: DC
  Administered 2019-04-25 – 2019-04-26 (×6): 200 mL

## 2019-04-25 NOTE — Progress Notes (Signed)
Pharmacy Antibiotic Note  Robert [name] is a 68 y.o. male with PMH of COPD, CAD, DM, ischemic cardiomyopathy, RA who was admitted on 04/11/2019 with COVID-19 pneumonia. Pt developed an MRSA pneumonia being treated with vancomycin. Due to persistent fevers, gram negative coverage will be added with cefepime. Pharmacy has been consulted for cefepime and vancomycin dosing.   Patient had a tmax of 101.2. Blood cultures collected. WBCs wnls, MAPs stable ~ 80s. SCr decreased today from 2.39 to 2.06. Vancomycin random level was on 08/14 @ 0535 was 18. Patient is on the last day of therapy for MRSA pneumonia today. Will give one more dose of vancomycin to complete therapy for MRSA pneumoia. Cefepime to be started. Length of therapy TBD   Plan: Vancomycin IV 1g x 1 (theraphy complete after dose) Initiate cefepime 2g q12h (dose adjusted per renal function with crcl ~ 40). Monitor clinical picture, renal function, WBCs, and cultures.  Height: 6' (182.9 cm) Weight: 227 lb 8.2 oz (103.2 kg) IBW/kg (Calculated) : 77.6  Temp (24hrs), Avg:100.3 F (37.9 C), Min:98.9 F (37.2 C), Max:101.2 F (38.4 C)  Recent Labs  Lab 04/18/19 2338 04/19/19 0340  04/19/19 1449  04/21/19 0500 04/22/19 0503 04/23/19 0501 04/23/19 1739 04/23/19 2100 04/24/19 0512 04/25/19 0535  WBC  --   --    < >  --    < > 12.8* 15.2* 11.1*  --   --  9.5 6.8  CREATININE  --  1.64*   < >  --    < > 1.47* 1.15 1.19 1.77*  --  2.39* 2.06*  LATICACIDVEN 1.6 1.2  --  1.1  --   --   --   --   --   --   --   --   VANCOTROUGH  --   --   --   --   --   --   --   --   --  17*  --   --   VANCORANDOM  --   --   --   --   --   --   --   --   --   --   --  18   < > = values in this interval not displayed.    Estimated Creatinine Clearance: 43.8 mL/min (A) (by C-G formula based on SCr of 2.06 mg/dL (H)).    Allergies  Allergen Reactions  . Ivp Dye [Iodinated Diagnostic Agents] Itching and Rash   Antimicrobials this admission: 7/31  vancomycin >> 8/1, resumed 8/8 >> 8/14 7/31 cefepime >> 8/5 8/14>> 7/31 metronidazole x1 8/1 Remdesivir >> 8/5 8/1 Actemra (PTA tocilizumab 162mg  SQ weekly on Mondays for RA)  8/7 Azithro >> 8/9 8/8 Zosyn >> 8/10  Dose adjustments this admission: 8/11 empiric renal adjustment. 8/12 VT - 36; Hold vanc  8/14 VR -18; Give one dose  Microbiology results: 7/31 BC x2: NGF 7/31 UCx: NGF 7/31 SARS CoV-2: positive 8/1 Ta: rare normal flora 8/1 MRSA PCR: neg 8/7 [id number]  8/7 BCx: NGF 8/8 UCx: NGF 8/14 Bcx: Collected   Thank you for allowing pharmacy to be a part of this patient's care.  Sherren Kerns, PharmD PGY1 Acute Care Pharmacy Resident [phone] 04/25/2019 12:53 PM

## 2019-04-25 NOTE — Progress Notes (Signed)
Assisted [name], patients daughter, to facetime. Family present to wish patient a happy birthday. This RN answered all questions.

## 2019-04-25 NOTE — Progress Notes (Addendum)
Inpatient Diabetes Program Recommendations  AACE/ADA: New Consensus Statement on Inpatient Glycemic Control (2015)  Target Ranges:  Prepandial:   less than 140 mg/dL      Peak postprandial:   less than 180 mg/dL (1-2 hours)      Critically ill patients:  140 - 180 mg/dL   Lab Results  Component Value Date   GLUCAP 218 (H) 04/25/2019   HGBA1C 7.4 (H) 04/11/2019    Review of Glycemic Control Results for Robert [name], Robert [name] (MRN [id number]) as of 04/25/2019 10:04  Ref. Range 04/24/2019 07:40 04/24/2019 12:14 04/24/2019 15:21 04/24/2019 20:32 04/25/2019 00:00 04/25/2019 04:37 04/25/2019 07:56  Glucose-Capillary Latest Ref Range: 70 - 99 mg/dL 300 (H)  Novolog 11 units  Lantus 32 units 362 (H)  Novolog 24 units  Lantus 8 units 335 (H)  Novolog 19 units 352 (H)  Novolog 24 units 327 (H)  Novolog 19 units 257 (H)  Novolog 15 units 218 (H)  Novolog 8 units   Diabetes history: DM 2 Outpatient Diabetes medications: Glipizide 10 mg bid, Toujeo 80 units Daily, Metformin 1,500 mg qam, 1000 mg qpm   Current orders for Inpatient glycemic control:  Lantus 30 units bid Novolog 0-20 units Q4 hours Novolog 4 units Q4 hours Tube Feed Coverage  Not on steroids  Inpatient Diabetes Program Recommendations:    Glucose trends lower this am however received a total of Lantus 40 units yesterday. Noted increase in Lantus to 30 units bid. Tube Feeds still running Vital AF 1.2 at 70 ml/hour  Will monitor trends  Thanks,  Tama Headings RN, MSN, BC-ADM Inpatient Diabetes Coordinator Team Pager [phone] (8a-5p)

## 2019-04-25 NOTE — Progress Notes (Signed)
NAME:  Robert [name], MRN:  [id number], DOB:  04-Mar-1951, LOS: 22 ADMISSION DATE:  04/11/2019, CONSULTATION DATE:  8/1 REFERRING MD:  Dr. Roderic Palau, CHIEF COMPLAINT:  dyspnea   Brief History   68 year old male admitted on 8/1 in the setting of ARDS from COVID-19 pneumonia. Initially presented to [facility] on 7/31 and required intubation. Post-procedure in the ED, patient awakened and self-extubated and required re-intubation. At [facility], tolerated PS and subsequently extubated on 8/2 however required intubation within 24 hours. Hospital course complicated by septic shock 2/2 MRSA pneumonia and volume overload.   Past Medical History  Anxiety COPD GERD CAD Hypertension Hyperlipidemia Rheumatoid arthritis History of systolic heart failure, however 08/2018 Echo showed LVEF 50-55%, left atrium dilated  Significant Hospital Events   8/1 admission to [facility] ICU 8/2 extubated 8/3 early AM agitated, placed on BIPAP, required re-intubation 8/4 started on steroids for COPD exacerbation 8/8 septic shock requiring pressor support 8/9 off pressors, respiratory cultures +s.aureus 8/10 persistent encephalopathy off sedation, CT head ordered 8/11 encephalopathy improved 8/12 difficulty weaning due to tachypnea, ETT exchanged due to clot burden  Consults:  PCCM  Procedures:  8/1 ETT > 8/2, 8/3 >  8/1 L subclavian CVL > 8/13 8/13 Rt PICC  Significant Diagnostic Tests:  08/2018 Echo> LVEF 50-55%, left atrium dilated  CT head 8/11- mild age-related atrophy and chronic microvascular ischemic changes.  Old lacunar infarct.  Micro Data:  7/31 blood > NGTD 7/31 sars-cov-2 > positive 8/1 Resp CX > Normal flora 8/7 Resp CX > MRSA 8/7 BCX > NGTD 8/8 UCX > NGTD  Antimicrobials:  Cefepime 7/31 > 8/4  Flagyl 7/31  Vanc 7/31, 8/8>  Remdesivir 8/1 > 8/5  Actemra 8/1  Decadron/solumedrol 8/1 >8/8  Zosyn 8/8>8/9 Azithro 8/7>8/9   Interim history/subjective:  PICC line placed and subclavian removed.  Pressure support weans.Foleys placed for urinary obstruction  Objective   Blood pressure (!) 145/68, pulse (!) 101, temperature (!) 100.4 F (38 C), temperature source Oral, resp. rate (!) 23, height 6' (1.829 m), weight 103.2 kg, SpO2 99 %. CVP:  [8 mmHg-21 mmHg] 21 mmHg  Vent Mode: PRVC FiO2 (%):  [35 %] 35 % Set Rate:  [26 bmp] 26 bmp Vt Set:  [460 mL] 460 mL PEEP:  [5 cmH20] 5 cmH20 Pressure Support:  [14 cmH20] 14 cmH20 Plateau Pressure:  [12 cmH20-16 cmH20] 16 cmH20   Intake/Output Summary (Last 24 hours) at 04/25/2019 0819 Last data filed at 04/25/2019 2482 Gross per 24 hour  Intake 1082.28 ml  Output 4640 ml  Net -3557.72 ml   Filed Weights   04/23/19 0400 04/24/19 0500 04/25/19 0500  Weight: 104.9 kg 104.5 kg 103.2 kg   Physical Exam: Gen:      No acute distress HEENT:  EOMI, sclera anicteric, ET tube Neck:     No masses; no thyromegaly Lungs:    Clear to auscultation bilaterally; normal respiratory effort CV:         Regular rate and rhythm; no murmurs Abd:      + bowel sounds; soft, non-tender; no palpable masses, no distension Ext:    2+ edema; adequate peripheral perfusion Skin:      Warm and dry; no rash Neuro: Sedated, unresponsive  Resolved Hospital Problem list   Septic shock 2/2 S. Aureus pna Non-sustained VT Persistent left subclavian line bleed  Assessment & Plan:   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: C/b MRSA pneumonia, COPD exacerbation and CFB S/p ETT exchange with improved  RR. Mental status remains barrier for extubation.  Plan Continue vancomycin Pressure support weans as tolerated Continue bronchodilators, flutter valve Will observe off Lasix as he is auto diuresing after foleys placement  Acute encephalopathy Wean off sedation. PRN fentanyl, Dilaudid.   Hypernatremia AKI Free water Monitor creatinine.   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, last thrombosis event in 2018 Atrial  fibrillation Continue Lopressor, Plavix Resume Lovenox  Best practice:  Diet: tube feeding Pain/Anxiety/Delirium protocol (if indicated): as above VAP protocol (if indicated): yes DVT prophylaxis: Lovenox  GI prophylaxis: Pantoprazole for stress ulcer prophylaxis Glucose control: SSI Mobility: bed rest Code Status: full Family Communication: Per TRH Disposition: remain in ICU   Critical care time: 35 mins   The patient is critically ill with multiple organ system failure and requires high complexity decision making for assessment and support, frequent evaluation and titration of therapies, advanced monitoring, review of radiographic studies and interpretation of complex data.   Marshell Garfinkel MD [place] [facility] Pager [phone] If no answer call [phone] 04/25/2019, 8:19 AM   .

## 2019-04-25 NOTE — Progress Notes (Signed)
[facility] Progress Note  Patient: Robert [name] MVH:[id number]   PCP: Redmond School, MD DOB: [birth year]   DOA: 04/11/2019   DOS: 04/25/2019   Date of Service: the patient was seen and examined on 04/25/2019  Brief hospital course: Pt. with PMH of  COPD, coronary artery disease, diabetes, ischemic cardiomyopathy, rheumatoid arthritis; admitted on 04/11/2019, Presents to the hospital with complaints of progressive worsening shortness of breath. He was mildly febrile and had worsening shortness of breath so he called EMS. In the ER, he was noted to be substantially hypoxic, had mental status changes. He tested positive for COVID-19. He was intubated in the emergency room for persistent hypoxia. He has been transferred to [facility] for further management. Currently further plan is continue with diuresis and SBT.  Subjective: fevers last night, needed foley catheter, needing more vent support.   Assessment and Plan: 1. Vent Mode: PSV;CPAP FiO2 (%):  [35 %] 35 % Set Rate:  [26 bmp] 26 bmp Vt Set:  [460 mL] 460 mL PEEP:  [5 cmH20] 5 cmH20 Pressure Support:  [10 cmH20] 10 cmH20 Plateau Pressure:  [12 cmH20-16 cmH20] 15 cmH20  Sedation with Precedex, Dilaudid, as needed fentanyl.  Acute COVID-19 Viral illness Lab Results  Component Value Date   SARSCOV2NAA POSITIVE (A) 04/11/2019   SARSCOV2NAA Detected (A) 04/11/2019   CXR: hazy bilateral peripheral opacities  Recent Labs    04/23/19 0501 04/24/19 0508 04/24/19 0512 04/25/19 0535  DDIMER 1.98*  --  2.30* 2.65*  CRP 1.5* 1.4*  --  0.8    Tmax last 24 hours: 100.4 Oxygen requirements: On vent, on Brovana and Pulmicort.  Antibiotics: On IV vancomycin for MRSA, day 5, started on 04/19/2019. Broadening coverage 04/25/2019  Diuretics: None due to AKI Vitamin C and Zinc: Continue DVT Prophylaxis: Subcutaneous Lovenox currently on hold due to bleeding and AKI Remdesivir: Completed Steroids: Completed Actemra: X1  off-label use of Actemra: On 04/12/2019. This patient has confirmed COVID-19 in the setting of the ongoing 2020 coronavirus pandemic.  He has hypoxia and is high-risk for intubation, but expected to survive >48 hours and has good baseline functional status.  he is not known to be on immunomodulators, anti-rejection medications, or cancer chemotherapy, has no history of TB or latent TB, and no history of diverticulitis or intestinal perforation.  Platelets are >50K, ANC is >500, and ALT/AST are below 5x ULN with no known hepatitis B infection. The investigational nature of this medication was discussed with the patient/HCPOA and they choose to proceed as the potential benefits are felt to outweigh risks at this time.   Prone positioning: Patient encouraged to stay in prone position as much as possible.  PPE During this encounter: Patient Isolation: Airborne + Droplet + Contact HCP PPE: CAPR, gown. gloves Patient PPE: None  The treatment plan and use of medications and known side effects were discussed with patient/family. It was clearly explained that there is no proven definitive treatment for COVID-19 infection yet. Any medications used here are based on case reports/anecdotal data which are not peer-reviewed and has not been studied using randomized control trials.  Complete risks and long-term side effects are unknown, however in the best clinical judgment they seem to be of some clinical benefit rather than medical risks.  Patient/family agree with the treatment plan and want to receive these treatments as indicated.   2.  MRSA healthcare associated pneumonia. Septic shock.  POA Required pressors now off. Started on IV vancomycin and Zosyn and azithromycin  from 04/19/2019. Sputum culture came back positive for MRSA. Blood cultures so far negative. Currently only on IV vancomycin.  Broadening the coverage due to persistent fevers.04/25/2019   Continue for now.  3.  Paroxysmal A. fib with RVR.  Currently in normal sinus rhythm. On metoprolol 25 mg twice daily. [place] Vasc score of 5.   Patient was on IV heparin.  Currently anticoagulation is on hold due to ET bleeding. Continue to monitor.  4.  Acute on chronic diastolic CHF. Essential hypertension. History of CAD Continue Lopressor. Continue IV diuresis. PCI 2018.  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 Discussed with cardiology on 04/24/2019.  Given active bleeding from subclavian line as well as ET tube currently holding off on anticoagulation. Resume aspirin and if no further bleeding add Plavix in 2 to 3 days. Resume aspirin today, and heparin s/c 04/26/19, plavix can be resumed on Monday.,    5.  Endotracheal tube clotting. Resolved  Bleeding from the subclavian line. Patient was on therapeutic heparin. ET tube was clotted.  Exchange on 04/23/2019. Currently no anticoagulation. Continue SCDs H&H relatively stable for now.  6.  Hypernatremia.  Hyperchloremia Currently resolved. Continue free water.  Stop D5. Monitor.  7.  Acute kidney injury. Urinary retention.  Likely combination of diuresis as well as vancomycin induced injury. Baseline serum creatinine 0.80. Progressively worsened during the hospital course. Normalized again around 04/22/2019. Worsened to 2.39 with BUN of 81 on 04/24/2019. Currently holding diuresis. Retention was seen on 08/13, needed foley catheter.  Also monitoring vancomycin dose.  8.  Type 2 diabetes uncontrolled with hyperglycemia. With vascular disease and neuropathy at baseline. On gabapentin at home. Glipizide acarbose metformin 1000 mg twice daily at home. Currently hyperglycemic in the hospital. Added insulin 4 units every 4 hours. Lantus 40 units daily. Insulin resistance every 4 hours.  9.  Rheumatoid arthritis. Patient is on 6 Actemra and methotrexate at home. Currently on hold.  10. Pressure ulcer medial sacrum bilateral buttocks.  Identified on 04/17/2019  not POA  per documentation. Continue frequent dressing changes.  Pressure Injury 04/17/19 Sacrum Medial Deep Tissue Injury - Purple or maroon localized area of discolored intact skin or blood-filled blister due to damage of underlying soft tissue from pressure and/or shear. dark purple with some skin sloughing on lowe (Active)  04/17/19 0730  Location: Sacrum  Location Orientation: Medial  Staging: Deep Tissue Injury - Purple or maroon localized area of discolored intact skin or blood-filled blister due to damage of underlying soft tissue from pressure and/or shear.  Wound Description (Comments): dark purple with some skin sloughing on lower sacral area  Present on Admission: No     Pressure Injury 04/17/19 Buttocks Right;Lower Unstageable - Full thickness tissue loss in which the base of the ulcer is covered by slough (yellow, tan, gray, green or brown) and/or eschar (tan, brown or black) in the wound bed. (Active)  04/17/19 0730  Location: Buttocks  Location Orientation: Right;Lower  Staging: Unstageable - Full thickness tissue loss in which the base of the ulcer is covered by slough (yellow, tan, gray, green or brown) and/or eschar (tan, brown or black) in the wound bed.  Wound Description (Comments):   Present on Admission: No     Pressure Injury 04/17/19 Buttocks Left;Lower Deep Tissue Injury - Purple or maroon localized area of discolored intact skin or blood-filled blister due to damage of underlying soft tissue from pressure and/or shear. (Active)  04/17/19 0730  Location: Buttocks  Location Orientation: Left;Lower  Staging: Deep  Tissue Injury - Purple or maroon localized area of discolored intact skin or blood-filled blister due to damage of underlying soft tissue from pressure and/or shear.  Wound Description (Comments):   Present on Admission: No     Diet: NPO  DVT Prophylaxis: SCD, pharmacological prophylaxis contraindicated due to ET tube bleeding  Advance goals of care discussion:  Full code  Family Communication: no family was present at bedside, at the time of interview.  Discussed with family on 04/23/2019, opportunity was given to ask question and all questions were answered satisfactorily.   Disposition:  Discharge to to be determined .  Consultants: PCCM  Procedures: none  Scheduled Meds: . arformoterol  15 mcg Nebulization BID  . aspirin  81 mg Per Tube Daily  . budesonide (PULMICORT) nebulizer solution  0.5 mg Nebulization BID  . chlorhexidine gluconate (MEDLINE KIT)  15 mL Mouth Rinse BID  . Chlorhexidine Gluconate Cloth  6 each Topical Daily  . Chlorhexidine Gluconate Cloth  6 each Topical Daily  . feeding supplement (PRO-STAT SUGAR FREE 64)  30 mL Per Tube TID  . folic acid  1 mg Per Tube Daily  . free water  200 mL Per Tube Q4H  . insulin aspart  0-20 Units Subcutaneous Q4H  . insulin aspart  4 Units Subcutaneous Q4H  . insulin glargine  30 Units Subcutaneous BID  . ipratropium-albuterol  3 mL Nebulization TID  . mouth rinse  15 mL Mouth Rinse 10 times per day  . metoprolol tartrate  25 mg Per Tube BID  . multivitamin  15 mL Per Tube Daily  . pantoprazole sodium  40 mg Per Tube Daily  . sodium chloride flush  10-40 mL Intracatheter Q12H   Continuous Infusions: . ceFEPime (MAXIPIME) IV Stopped (04/25/19 1356)  . dexmedetomidine (PRECEDEX) IV infusion 0.2 mcg/kg/hr (04/25/19 1600)  . feeding supplement (VITAL AF 1.2 CAL) 70 mL/hr at 04/25/19 1600   PRN Meds: acetaminophen, dextrose, fentaNYL (SUBLIMAZE) injection, hydrALAZINE, HYDROmorphone, sodium chloride flush, Thrombi-Pad Antibiotics: Anti-infectives (From admission, onward)   Start     Dose/Rate Route Frequency Ordered Stop   04/25/19 1400  vancomycin (VANCOCIN) IVPB 1000 mg/200 mL premix     1,000 mg 200 mL/hr over 60 Minutes Intravenous  Once 04/25/19 1251 04/25/19 1456   04/25/19 1300  ceFEPIme (MAXIPIME) 2 g in sodium chloride 0.9 % 100 mL IVPB     2 g 200 mL/hr over 30 Minutes  Intravenous Every 12 hours 04/25/19 1247     04/23/19 1921  vancomycin variable dose per unstable renal function (pharmacist dosing)  Status:  Discontinued      Does not apply See admin instructions 04/23/19 1921 04/25/19 1252   04/22/19 2200  dextrose 5 % 250 mL with vancomycin (VANCOCIN) 1,250 mg infusion  Status:  Discontinued     166.7 mL/hr  Intravenous Every 12 hours 04/22/19 1324 04/23/19 1921   04/20/19 1400  vancomycin (VANCOCIN) 1,250 mg in sodium chloride 0.9 % 250 mL IVPB  Status:  Discontinued     1,250 mg 166.7 mL/hr over 90 Minutes Intravenous Every 24 hours 04/19/19 1352 04/20/19 1215   04/20/19 1400  vancomycin (VANCOCIN) 1,250 mg in sodium chloride 0.9 % 250 mL IVPB  Status:  Discontinued     1,250 mg 166.7 mL/hr over 90 Minutes Intravenous Every 24 hours 04/20/19 1215 04/20/19 1220   04/20/19 1400  Vancomycin HCl in Dextrose 1.25-5 GM/250ML-% SOLN 1,250 mg  Status:  Discontinued     1,250 mg 166.7  mL/hr  Intravenous Every 24 hours 04/20/19 1220 04/20/19 1221   04/20/19 1400  Vancomycin 1222m in D5W  Status:  Discontinued     166.7 mL/hr  Intravenous Continuous 04/20/19 1230 04/20/19 1233   04/20/19 1400  Vancomycin 12531min D5W  Status:  Discontinued     166.7 mL/hr  Intravenous Every 24 hours 04/20/19 1233 04/22/19 1324   04/19/19 2200  piperacillin-tazobactam (ZOSYN) IVPB 3.375 g  Status:  Discontinued     3.375 g 12.5 mL/hr over 240 Minutes Intravenous Every 8 hours 04/19/19 1344 04/21/19 1533   04/19/19 1430  vancomycin (VANCOCIN) 2,000 mg in sodium chloride 0.9 % 500 mL IVPB     2,000 mg 250 mL/hr over 120 Minutes Intravenous  Once 04/19/19 1344 04/19/19 1806   04/19/19 1400  piperacillin-tazobactam (ZOSYN) IVPB 3.375 g     3.375 g 100 mL/hr over 30 Minutes Intravenous  Once 04/19/19 1344 04/19/19 1530   04/19/19 1000  azithromycin (ZITHROMAX) tablet 250 mg  Status:  Discontinued     250 mg Per Tube Daily 04/18/19 1246 04/20/19 1655   04/18/19 1300  azithromycin  (ZITHROMAX) tablet 500 mg     500 mg Per Tube Daily 04/18/19 1246 04/18/19 1351   04/13/19 1000  remdesivir 100 mg in sodium chloride 0.9 % 250 mL IVPB     100 mg 500 mL/hr over 30 Minutes Intravenous Every 24 hours 04/12/19 0833 04/16/19 1103   04/12/19 1000  remdesivir 200 mg in sodium chloride 0.9 % 250 mL IVPB     200 mg 500 mL/hr over 30 Minutes Intravenous Once 04/12/19 0833 04/12/19 1317   04/12/19 0600  ceFEPIme (MAXIPIME) 2 g in sodium chloride 0.9 % 100 mL IVPB  Status:  Discontinued     2 g 200 mL/hr over 30 Minutes Intravenous Every 8 hours 04/11/19 2253 04/15/19 1452   04/12/19 0200  vancomycin (VANCOCIN) IVPB 750 mg/150 ml premix  Status:  Discontinued     750 mg 150 mL/hr over 60 Minutes Intravenous Every 12 hours 04/11/19 2253 04/12/19 1455   04/11/19 2130  ceFEPIme (MAXIPIME) 2 g in sodium chloride 0.9 % 100 mL IVPB     2 g 200 mL/hr over 30 Minutes Intravenous  Once 04/11/19 2125 04/11/19 2257   04/11/19 2130  metroNIDAZOLE (FLAGYL) IVPB 500 mg     500 mg 100 mL/hr over 60 Minutes Intravenous  Once 04/11/19 2125 04/11/19 2356   04/11/19 2130  vancomycin (VANCOCIN) IVPB 1000 mg/200 mL premix     1,000 mg 200 mL/hr over 60 Minutes Intravenous  Once 04/11/19 2125 04/12/19 0105       Objective: Physical Exam: Vitals:   04/25/19 1300 04/25/19 1400 04/25/19 1500 04/25/19 1600  BP: 120/61 126/61 128/67 134/69  Pulse: 80 91 91 94  Resp: (!) 24 (!) 33 (!) 34 (!) 29  Temp:    100 F (37.8 C)  TempSrc:    Oral  SpO2: 98% 97% 96% 98%  Weight:      Height:        Intake/Output Summary (Last 24 hours) at 04/25/2019 1710 Last data filed at 04/25/2019 1600 Gross per 24 hour  Intake 1018.09 ml  Output 3385 ml  Net -2366.91 ml   Filed Weights   04/23/19 0400 04/24/19 0500 04/25/19 0500  Weight: 104.9 kg 104.5 kg 103.2 kg   General: obtunded and nonverbal. Appear in moderate distress, affect unresponsive Eyes: PERRL, Conjunctiva normal ENT: Oral Mucosa Clear, moist   Neck: difficult  to assess  JVD, no Abnormal Mass Or lumps Cardiovascular: S1 and S2 Present, no Murmur, peripheral pulses symmetrical Respiratory: increased  respiratory effort, Bilateral Air entry equal and Decreased, no use of accessory muscle, bilateral  Crackles, no wheezes Abdomen: Bowel Sound present, Soft and no tenderness, no hernia Skin: none Extremities: no Pedal edema, no calf tenderness Neurologic: Nonfocal Gait not checked due to patient safety concerns  Data Reviewed: CBC: Recent Labs  Lab 04/21/19 0500  04/22/19 0503 04/23/19 0501 04/24/19 0424 04/24/19 0512 04/25/19 0535  WBC 12.8*  --  15.2* 11.1*  --  9.5 6.8  NEUTROABS 10.8*  --  13.0* 9.3*  --  7.9* 5.4  HGB 13.0   < > 12.2* 11.9* 10.2* 11.2* 10.2*  HCT 43.2   < > 40.4 38.4* 30.0* 34.6* 32.5*  MCV 97.3  --  98.5 95.0  --  94.3 95.9  PLT 162  --  133* 122*  --  115* 110*   < > = values in this interval not displayed.   Basic Metabolic Panel: Recent Labs  Lab 04/21/19 0500  04/22/19 0503 04/23/19 0501 04/23/19 1739 04/24/19 0424 04/24/19 0512 04/25/19 0535  NA 147*   < > 145 147* 146* 147* 145 151*  K 4.3   < > 4.4 3.8 4.0 4.3 4.4 4.1  CL 114*  --  111 112* 112*  --  111 118*  CO2 26  --  _0 --  24 25  GLUCOSE 173*  --  195* 154* 256*  --  310* 259*  BUN 67*  --  54* 51* 66*  --  81* 89*  CREATININE 1.47*  --  1.15 1.19 1.77*  --  2.39* 2.06*  CALCIUM 8.1*  --  8.1* 8.4* 8.2*  --  7.9* 8.0*  MG 2.7*  --  2.8* 2.7*  --   --  3.1* 2.8*   < > = values in this interval not displayed.    Liver Function Tests: Recent Labs  Lab 04/21/19 0500 04/22/19 0503 04/23/19 0501 04/24/19 0512 04/25/19 0535  AST 42* 27 27 32 31  ALT 92* 67* 56* 50* 54*  ALKPHOS 58 66 75 75 58  BILITOT 0.6 0.7 0.8 0.5 0.4  PROT 5.7* 5.7* 6.0* 5.0* 5.2*  ALBUMIN 2.8* 2.7* 2.6* 2.2* 2.2*   No results for input(s): LIPASE, AMYLASE in the last 168 hours. No results for input(s): AMMONIA in the last 168 hours.  Coagulation Profile: No results for input(s): INR, PROTIME in the last 168 hours. Cardiac Enzymes: No results for input(s): CKTOTAL, CKMB, CKMBINDEX, TROPONINI in the last 168 hours. BNP (last 3 results) No results for input(s): PROBNP in the last 8760 hours. CBG: Recent Labs  Lab 04/25/19 0000 04/25/19 0437 04/25/19 0756 04/25/19 1307 04/25/19 1539  GLUCAP 327* 257* 218* 279* 312*   Studies: Dg Chest Port 1 View  Result Date: 04/25/2019 CLINICAL DATA:  Fever. EXAM: PORTABLE CHEST 1 VIEW COMPARISON:  04/23/2019.  04/19/2019.  CT 11/29/2016. FINDINGS: Right PICC line noted with tip at cavoatrial junction. Endotracheal tube and feeding tube in stable position. Heart size stable. Diffuse bilateral pulmonary interstitial prominence unchanged from prior exam. Findings again consistent pneumonitis. No pleural effusion or pneumothorax. Gastric distention noted. IMPRESSION: 1. Interim placement right PICC line, its tip is in the cavoatrial junction. Endotracheal tube and feeding tube in stable position. 2. Persistent bilateral interstitial prominence consistent pneumonitis again noted. No interim change. 3.  Gastric distention. Electronically Signed  By: [place]   On: 04/25/2019 09:34     Time spent: 35 minutes  Author: Berle Mull, MD [facility] 04/25/2019 5:10 PM  To reach On-call, see care teams to locate the attending and reach out to them via [email or web address]. If 7PM-7AM, please contact night-coverage If you still have difficulty reaching the attending provider, please page the [facility] (Director on Call) for [facility] on amion for assistance.

## 2019-04-26 LAB — CBC WITH DIFFERENTIAL/PLATELET
Abs Immature Granulocytes: 0.02 10*3/uL (ref 0.00–0.07)
Basophils Absolute: 0 10*3/uL (ref 0.0–0.1)
Basophils Relative: 1 %
Eosinophils Absolute: 0.2 10*3/uL (ref 0.0–0.5)
Eosinophils Relative: 2 %
HCT: 31.3 % — ABNORMAL LOW (ref 39.0–52.0)
Hemoglobin: 9.7 g/dL — ABNORMAL LOW (ref 13.0–17.0)
Immature Granulocytes: 0 %
Lymphocytes Relative: 10 %
Lymphs Abs: 0.7 10*3/uL (ref 0.7–4.0)
MCH: 30.2 pg (ref 26.0–34.0)
MCHC: 31 g/dL (ref 30.0–36.0)
MCV: 97.5 fL (ref 80.0–100.0)
Monocytes Absolute: 0.6 10*3/uL (ref 0.1–1.0)
Monocytes Relative: 9 %
Neutro Abs: 5 10*3/uL (ref 1.7–7.7)
Neutrophils Relative %: 78 %
Platelets: 102 10*3/uL — ABNORMAL LOW (ref 150–400)
RBC: 3.21 MIL/uL — ABNORMAL LOW (ref 4.22–5.81)
RDW: 17.2 % — ABNORMAL HIGH (ref 11.5–15.5)
WBC: 6.4 10*3/uL (ref 4.0–10.5)
nRBC: 0 % (ref 0.0–0.2)

## 2019-04-26 LAB — COMPREHENSIVE METABOLIC PANEL
ALT: 88 U/L — ABNORMAL HIGH (ref 0–44)
AST: 59 U/L — ABNORMAL HIGH (ref 15–41)
Albumin: 2.2 g/dL — ABNORMAL LOW (ref 3.5–5.0)
Alkaline Phosphatase: 63 U/L (ref 38–126)
Anion gap: 5 (ref 5–15)
BUN: 86 mg/dL — ABNORMAL HIGH (ref 8–23)
CO2: 26 mmol/L (ref 22–32)
Calcium: 8.2 mg/dL — ABNORMAL LOW (ref 8.9–10.3)
Chloride: 122 mmol/L — ABNORMAL HIGH (ref 98–111)
Creatinine, Ser: 1.7 mg/dL — ABNORMAL HIGH (ref 0.61–1.24)
GFR calc Af Amer: 48 mL/min — ABNORMAL LOW (ref >60)
GFR calc non Af Amer: 41 mL/min — ABNORMAL LOW (ref >60)
Glucose, Bld: 239 mg/dL — ABNORMAL HIGH (ref 70–99)
Potassium: 4.4 mmol/L (ref 3.5–5.1)
Sodium: 153 mmol/L — ABNORMAL HIGH (ref 135–145)
Total Bilirubin: 0.3 mg/dL (ref 0.3–1.2)
Total Protein: 5.1 g/dL — ABNORMAL LOW (ref 6.5–8.1)

## 2019-04-26 LAB — BASIC METABOLIC PANEL
Anion gap: 6 (ref 5–15)
BUN: 77 mg/dL — ABNORMAL HIGH (ref 8–23)
CO2: 25 mmol/L (ref 22–32)
Calcium: 7.9 mg/dL — ABNORMAL LOW (ref 8.9–10.3)
Chloride: 116 mmol/L — ABNORMAL HIGH (ref 98–111)
Creatinine, Ser: 1.55 mg/dL — ABNORMAL HIGH (ref 0.61–1.24)
GFR calc Af Amer: 53 mL/min — ABNORMAL LOW (ref >60)
GFR calc non Af Amer: 46 mL/min — ABNORMAL LOW (ref >60)
Glucose, Bld: 415 mg/dL — ABNORMAL HIGH (ref 70–99)
Potassium: 4.2 mmol/L (ref 3.5–5.1)
Sodium: 147 mmol/L — ABNORMAL HIGH (ref 135–145)

## 2019-04-26 LAB — CBC
HCT: 30.5 % — ABNORMAL LOW (ref 39.0–52.0)
Hemoglobin: 9.5 g/dL — ABNORMAL LOW (ref 13.0–17.0)
MCH: 30.4 pg (ref 26.0–34.0)
MCHC: 31.1 g/dL (ref 30.0–36.0)
MCV: 97.4 fL (ref 80.0–100.0)
Platelets: 92 10*3/uL — ABNORMAL LOW (ref 150–400)
RBC: 3.13 MIL/uL — ABNORMAL LOW (ref 4.22–5.81)
RDW: 17.2 % — ABNORMAL HIGH (ref 11.5–15.5)
WBC: 6.7 10*3/uL (ref 4.0–10.5)
nRBC: 0 % (ref 0.0–0.2)

## 2019-04-26 LAB — C-REACTIVE PROTEIN: CRP: 0.9 mg/dL (ref <1.0)

## 2019-04-26 LAB — GLUCOSE, CAPILLARY
Glucose-Capillary: 226 mg/dL — ABNORMAL HIGH (ref 70–99)
Glucose-Capillary: 126 mg/dL — ABNORMAL HIGH (ref 70–99)
Glucose-Capillary: 238 mg/dL — ABNORMAL HIGH (ref 70–99)
Glucose-Capillary: 235 mg/dL — ABNORMAL HIGH (ref 70–99)
Glucose-Capillary: 222 mg/dL — ABNORMAL HIGH (ref 70–99)
Glucose-Capillary: 278 mg/dL — ABNORMAL HIGH (ref 70–99)

## 2019-04-26 LAB — TYPE AND SCREEN
ABO/RH(D): O POS
Antibody Screen: NEGATIVE

## 2019-04-26 LAB — MAGNESIUM: Magnesium: 2.7 mg/dL — ABNORMAL HIGH (ref 1.7–2.4)

## 2019-04-26 LAB — PROCALCITONIN: Procalcitonin: 0.5 ng/mL

## 2019-04-26 LAB — D-DIMER, QUANTITATIVE: D-Dimer, Quant: 2.94 ug/mL-FEU — ABNORMAL HIGH (ref 0.00–0.50)

## 2019-04-26 MED ORDER — VANCOMYCIN HCL 10 G IV SOLR
1750.0000 mg | INTRAVENOUS | Status: DC
  Administered 2019-04-26 – 2019-04-28 (×3): 1750 mg via INTRAVENOUS
  Filled 2019-04-26 (×4): qty 1750

## 2019-04-26 MED ORDER — INSULIN ASPART 100 UNIT/ML ~~LOC~~ SOLN
8.0000 [IU] | SUBCUTANEOUS | Status: DC
  Administered 2019-04-26 – 2019-04-28 (×11): 8 [IU] via SUBCUTANEOUS

## 2019-04-26 MED ORDER — DEXMEDETOMIDINE HCL IN NACL 400 MCG/100ML IV SOLN
0.0000 ug/kg/h | INTRAVENOUS | Status: AC
  Administered 2019-04-26 – 2019-04-28 (×4): 0.3 ug/kg/h via INTRAVENOUS
  Administered 2019-04-29: 0.2 ug/kg/h via INTRAVENOUS
  Filled 2019-04-26 (×5): qty 100

## 2019-04-26 MED ORDER — VITAMIN C 500 MG PO TABS
500.0000 mg | ORAL_TABLET | Freq: Every day | ORAL | Status: DC
  Administered 2019-04-26 – 2019-05-05 (×10): 500 mg
  Filled 2019-04-26 (×10): qty 1

## 2019-04-26 MED ORDER — FREE WATER
200.0000 mL | Freq: Three times a day (TID) | Status: DC
  Administered 2019-04-26 – 2019-04-28 (×6): 200 mL

## 2019-04-26 MED ORDER — DEXTROSE 5 % IV SOLN
INTRAVENOUS | Status: DC
  Administered 2019-04-26 – 2019-05-01 (×6): via INTRAVENOUS

## 2019-04-26 MED ORDER — ZINC SULFATE 220 (50 ZN) MG PO CAPS
220.0000 mg | ORAL_CAPSULE | Freq: Every day | ORAL | Status: DC
  Administered 2019-04-26 – 2019-05-05 (×10): 220 mg
  Filled 2019-04-26 (×10): qty 1

## 2019-04-26 MED ORDER — INSULIN GLARGINE 100 UNIT/ML ~~LOC~~ SOLN
35.0000 [IU] | Freq: Two times a day (BID) | SUBCUTANEOUS | Status: DC
  Administered 2019-04-26 (×2): 35 [IU] via SUBCUTANEOUS
  Filled 2019-04-26 (×4): qty 0.35

## 2019-04-26 NOTE — Progress Notes (Signed)
Pharmacy Antibiotic Note  Robert [name] is a 68 y.o. male with PMH of COPD, CAD, DM, ischemic cardiomyopathy, RA who was admitted on 04/11/2019 with COVID-19 pneumonia. Pt developed an MRSA pneumonia being treated with vancomycin. Due to persistent fevers, gram negative coverage will be added with cefepime. Pharmacy has been consulted for cefepime and vancomycin dosing.   Patient had a tmax of 101.2. Blood cultures collected. WBCs wnls, MAPs stable ~ 80s. SCr decreased today from 2.39 to 2.06. Vancomycin random level was on 08/14 @ 0535 was 18. Patient completed day #7/7 of Vanc for MRSA pneumonia, but will continue vanc and start cefepime for sepsis.   Plan:  Vancomycin IV 1750 mg IV q24h  Goal AUC = 400 - 550.  Est AUC 500 using SCr 1.55  Continue cefepime 2g q12h  Monitor clinical picture, renal function, WBCs, and cultures.  Height: 6' (182.9 cm) Weight: 227 lb 8.2 oz (103.2 kg) IBW/kg (Calculated) : 77.6  Temp (24hrs), Avg:100.4 F (38 C), Min:99.1 F (37.3 C), Max:101.2 F (38.4 C)  Recent Labs  Lab 04/19/19 1449  04/22/19 0503 04/23/19 0501 04/23/19 1739 04/23/19 2100 04/24/19 0512 04/25/19 0535 04/26/19 0520  WBC  --    < > 15.2* 11.1*  --   --  9.5 6.8 6.4  CREATININE  --    < > 1.15 1.19 1.77*  --  2.39* 2.06* 1.70*  LATICACIDVEN 1.1  --   --   --   --   --   --   --   --   VANCOTROUGH  --   --   --   --   --  3*  --   --   --   VANCORANDOM  --   --   --   --   --   --   --  18  --    < > = values in this interval not displayed.    Estimated Creatinine Clearance: 53.1 mL/min (A) (by C-G formula based on SCr of 1.7 mg/dL (H)).    Allergies  Allergen Reactions  . Ivp Dye [Iodinated Diagnostic Agents] Itching and Rash   Antimicrobials this admission: 7/31 vancomycin >> 8/1, resumed 8/8 >>  7/31 cefepime >> 8/5, resumed 8/14>> 7/31 metronidazole x1 8/1 Remdesivir >> 8/5 8/1 Actemra (PTA tocilizumab 162mg  SQ weekly on Mondays for RA)  8/7 Azithro >>  8/9 8/8 Zosyn >> 8/10  Dose adjustments this admission: 8/11 empiric renal adjustment. 8/12 VT - 36; Hold vanc  8/14 VR -53; Vanc x1  Microbiology results: 7/31 [facility] x2: NGF 7/31 UCx: NGF 7/31 SARS CoV-2: positive 8/1 Ta: rare normal flora 8/1 MRSA PCR: neg 8/7 OI:[id number]  8/7 BCx: NGF 8/8 UCx: NGF 8/14 Bcx: ngtd 8/15 TA:    Thank you for allowing pharmacy to be a part of this patient's care.  Gretta Arab PharmD, BCPS Clinical pharmacist phone 7am- 5pm: [phone] 04/26/2019 12:22 PM

## 2019-04-26 NOTE — Progress Notes (Signed)
NAME:  Robert [name], MRN:  [id number], DOB:  24-Oct-1950, LOS: 47 ADMISSION DATE:  04/11/2019, CONSULTATION DATE:  8/1 REFERRING MD:  Dr. Roderic Palau, CHIEF COMPLAINT:  dyspnea   Brief History   68 year old male admitted on 8/1 in the setting of ARDS from COVID-19 pneumonia. Initially presented to [facility] on 7/31 and required intubation. Post-procedure in the ED, patient awakened and self-extubated and required re-intubation. At [facility], tolerated PS and subsequently extubated on 8/2 however required intubation within 24 hours. Hospital course complicated by septic shock 2/2 MRSA pneumonia and volume overload.   Past Medical History  Anxiety COPD GERD CAD Hypertension Hyperlipidemia Rheumatoid arthritis History of systolic heart failure, however 08/2018 Echo showed LVEF 50-55%, left atrium dilated  Significant Hospital Events   8/1 admission to [facility] ICU 8/2 extubated 8/3 early AM agitated, placed on BIPAP, required re-intubation 8/4 started on steroids for COPD exacerbation 8/8 septic shock requiring pressor support 8/9 off pressors, respiratory cultures +s.aureus 8/10 persistent encephalopathy off sedation, CT head ordered 8/11 encephalopathy improved 8/12 difficulty weaning due to tachypnea, ETT exchanged due to clot burden  Consults:  PCCM  Procedures:  8/1 ETT > 8/2, 8/3 >  8/1 L subclavian CVL > 8/13 8/13 Rt PICC  Significant Diagnostic Tests:  08/2018 Echo> LVEF 50-55%, left atrium dilated  CT head 8/11- mild age-related atrophy and chronic microvascular ischemic changes.  Old lacunar infarct.  Micro Data:  7/31 blood > NGTD 7/31 sars-cov-2 > positive 8/1 Resp CX > Normal flora 8/7 Resp CX > MRSA 8/7 BCX > NGTD 8/8 UCX > NGTD  Antimicrobials:  Cefepime 7/31 > 8/4  Flagyl 7/31  Vanc 7/31, 8/8>  Remdesivir 8/1 > 8/5  Actemra 8/1  Decadron/solumedrol 8/1 >8/8  Zosyn 8/8>8/9 Azithro 8/7>8/9   Interim history/subjective:  On pressure support weans 8/5 Urine  output and creatinine are improving.  Objective   Blood pressure 110/60, pulse 83, temperature (!) 100.8 F (38.2 C), temperature source Axillary, resp. rate (!) 32, height 6' (1.829 m), weight 103.2 kg, SpO2 98 %. CVP:  [1 mmHg-12 mmHg] 1 mmHg  Vent Mode: PSV;CPAP FiO2 (%):  [30 %-35 %] 30 % Set Rate:  [26 bmp] 26 bmp Vt Set:  [460 mL] 460 mL PEEP:  [5 cmH20] 5 cmH20 Pressure Support:  [8 cmH20-10 cmH20] 8 cmH20 Plateau Pressure:  [13 cmH20-20 cmH20] 13 cmH20   Intake/Output Summary (Last 24 hours) at 04/26/2019 0854 Last data filed at 04/26/2019 0700 Gross per 24 hour  Intake 7985.69 ml  Output 2300 ml  Net 5685.69 ml   Filed Weights   04/23/19 0400 04/24/19 0500 04/25/19 0500  Weight: 104.9 kg 104.5 kg 103.2 kg   Physical Exam: Blood pressure (!) 132/58, pulse 88, temperature 99.1 F (37.3 C), resp. rate (!) 34, height 6' (1.829 m), weight 103.2 kg, SpO2 99 %. Gen:      No acute distress HEENT:  EOMI, sclera anicteric, ETT Neck:     No masses; no thyromegaly Lungs:    Clear to auscultation bilaterally; normal respiratory effort CV:         Regular rate and rhythm; no murmurs Abd:      + bowel sounds; soft, non-tender; no palpable masses, no distension Ext:    1+ edema; adequate peripheral perfusion Skin:      Warm and dry; no rash Neuro: Awake, responds to questions.   Resolved Hospital Problem list   Septic shock 2/2 S. Aureus pna Non-sustained VT Persistent left subclavian  line bleed  Assessment & Plan:   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: C/b MRSA pneumonia, COPD exacerbation and CFB S/p ETT exchange with improved RR. Mental status remains barrier for extubation.  Plan Continue vancomycin Pressure support weans as tolerated Continue bronchodilators, flutter valve Resume Lasix as she is positive.  Acute encephalopathy Wean off sedation. PRN fentanyl, Dilaudid.   Hypernatremia AKI Free water Monitor creatinine.   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, last thrombosis event in 2018 Atrial fibrillation Continue Lopressor, Plavix Resume Lovenox  Best practice:  Diet: tube feeding Pain/Anxiety/Delirium protocol (if indicated): as above VAP protocol (if indicated): yes DVT prophylaxis: Lovenox  GI prophylaxis: Pantoprazole for stress ulcer prophylaxis Glucose control: SSI Mobility: bed rest Code Status: full Family Communication: Per TRH Disposition: remain in ICU  Critical care time: 35 mins   The patient is critically ill with multiple organ system failure and requires high complexity decision making for assessment and support, frequent evaluation and titration of therapies, advanced monitoring, review of radiographic studies and interpretation of complex data.   Marshell Garfinkel MD [facility] Pager [phone] If no answer call [phone] 04/26/2019, 8:54 AM   .

## 2019-04-26 NOTE — Progress Notes (Signed)
Patient had 72 beat run SVT - Dr. Vaughan Browner made aware. NSR resumed and maintained at this time.

## 2019-04-26 NOTE — Progress Notes (Signed)
[facility] Progress Note  Patient: Robert [name]   PCP: Redmond School, MD DOB: [birth year]   DOA: 04/11/2019   DOS: 04/26/2019   Date of Service: the patient was seen and examined on 04/26/2019  Brief hospital course: Pt. with PMH of COPD, coronary artery disease, diabetes, ischemic cardiomyopathy, rheumatoid arthritis; admitted on 04/11/2019, Presents to the hospital with complaints of progressive worsening shortness of breath. He was mildly febrile and had worsening shortness of breath so he called EMS. In the ER, he was noted to be substantially hypoxic, had mental status changes. He tested positive for COVID-19. He was intubated in the emergency room for persistent hypoxia. He has been transferred to [facility] for further management.  Currently further plan is continue to support breathing.  Significant events: 8/1 admission to [facility] ICU 8/2 extubated 8/3 early AM agitated, placed on BIPAP, required re-intubation 8/4 started on steroids for COPD exacerbation 8/8 septic shock requiring pressor support 8/9 off pressors, respiratory cultures +s.aureus 8/10 persistent encephalopathy off sedation, CT head ordered 8/11 encephalopathy improved 8/12 difficulty weaning due to tachypnea, ETT exchanged due to clot burden 8/13 PICC line inserted and subclavian central line removed 8/14 Antibiotics coverage broaden due to persistent fever and resp distress, pan cultured   COVID 19 specific treatment: Vitamin C and Zinc: started on 04/26/2019 DVT Prophylaxis: SCD, pharmacological prophylaxis contraindicated due to active bleeding issues last 31 huors resume 04/27/19  Remdesivir: completed 04/16/2019 Steroids: off since 04/19/2019 Actemra: received off-label Actemra on 04/12/2019  Subjective: febrile last night temp 101.2, the charting of free water was wrong, at one time there was documentation of 5700 ml of free water.no active bleeding reported today.    Assessment and Plan: 1. Acute COVID-19 Viral illness  Lab Results  Component Value Date   SARSCOV2NAA POSITIVE (A) 04/11/2019   SARSCOV2NAA Detected (A) 04/11/2019   CXR: hazy bilateral peripheral opacities  Recent Labs    04/24/19 0508 04/24/19 0512 04/25/19 0535 04/26/19 0519 04/26/19 0520  DDIMER  --  2.30* 2.65*  --  2.94*  CRP 1.4*  --  0.8 0.9  --     Tmax last 24 hours:  Temp (24hrs), Avg:100 F (37.8 C), Min:98.8 F (37.1 C), Max:101.2 F (38.4 C)  Oxygen requirements: Vent Mode: PSV;CPAP FiO2 (%):  [30 %-35 %] 30 % Set Rate:  [26 bmp] 26 bmp Vt Set:  [460 mL] 460 mL PEEP:  [5 cmH20] 5 cmH20 Pressure Support:  [5 cmH20-10 cmH20] 8 cmH20 Plateau Pressure:  [13 cmH20-20 cmH20] 13 cmH20   Antibiotics:on vancomycin and cefepime again, completed course for MRSA pneumonia Diuretics: PRN lasix, none 04/26/19, net negative in last 24 hours, error in documentation  Prone positioning: Patient encouraged to stay in prone position as much as possible.  This patient has confirmed COVID-19 in the setting of the ongoing 2020 coronavirus pandemic.  He has hypoxia and is high-risk for intubation, (due to age/BMI >35/CAD/CRP > 14 mg/dL, or troponin >=0.1 ng/dL) but expected to survive >48 hours and has good baseline functional status.  he is not known to be on immunomodulators, anti-rejection medications, or cancer chemotherapy, has no history of TB or latent TB, and no history of diverticulitis or intestinal perforation.  Platelets are >50K, ANC is >500, and ALT/AST are below 5x ULN with no known hepatitis B infection. The investigational nature of this medication was discussed with the patient/HCPOA and they choose to proceed as the potential benefits are felt to outweigh risks at this  time.  PPE During this encounter: Patient Isolation: Airborne + Droplet + Contact HCP PPE: CAPR, gown. gloves Patient PPE: None  The treatment plan and use of medications and known side effects  were discussed with patient/family. It was clearly explained that there is no proven definitive treatment for COVID-19 infection yet. Any medications used here are based on case reports/anecdotal data which are not peer-reviewed and has not been studied using randomized control trials.  Complete risks and long-term side effects are unknown, however in the best clinical judgment they seem to be of some clinical benefit rather than medical risks.  Patient/family agree with the treatment plan and want to receive these treatments as indicated.   2.  MRSA healthcare associated pneumonia. Septic shock.  POA Required pressors now off. Started on IV vancomycin and Zosyn and azithromycin from 04/19/2019. Sputum culture came back positive for MRSA. Blood cultures so far negative. Broadening the coverage due to persistent fevers 04/25/2019. Cultures so far negative. Continue for now.  3.  Paroxysmal A. fib with RVR. Currently in normal sinus rhythm. On metoprolol 25 mg twice daily. [place] Vasc score of 5.   Patient was on IV heparin.  Currently anticoagulation is on hold due to bleeding. Continue to monitor.  4.  Acute on chronic diastolic CHF. Essential hypertension. History of CAD Continue Lopressor. Continue PRN IV diuresis. PCI 2018.  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 Discussed with cardiology on 04/24/2019.  Given active bleeding from subclavian line as well as ET tube currently holding off on anticoagulation. Resume aspirin and if no further bleeding add Plavix in 2 to 3 days. Resume aspirin today, and heparin s/c 04/27/19, plavix can be resumed on Monday  5.  Endotracheal tube clotting. Resolved  Bleeding from the subclavian line. Anemia clinically undetermined etiology  Patient was on therapeutic heparin. ET tube was clotted.  Exchange on 04/23/2019. Currently no anticoagulation. Continue SCDs H&H with slow down trend in last 2 days.  Recheck today and hold resuming DVT Px  tomorrow.  6.  Hypernatremia.  Hyperchloremia Currently resolved. Continue free water, was on q4 and now q8. Resume D5. Monitor. Recheck later.   7.  Acute kidney injury. Urinary retention.  Likely combination of diuresis as well as vancomycin induced injury. Baseline serum creatinine 0.80. Progressively worsened during the hospital course. Normalized again around 04/22/2019. Worsened to 2.39 with BUN of 81 on 04/24/2019. Currently holding diuresis. Retention was seen on 08/13, needed foley catheter.  Also monitoring vancomycin dose. Net negative in last 24 hours, there was 5.7litre error in documentation on free water.  8.  Type 2 diabetes uncontrolled with hyperglycemia. With vascular disease and neuropathy at baseline. On gabapentin at home. Glipizide acarbose metformin 1000 mg twice daily at home. Currently hyperglycemic in the hospital. Added insulin 4 units every 4 hours. Lantus 40 units daily. Insulin resistance every 4 hours.  9.  Rheumatoid arthritis. Patient is on 6 Actemra and methotrexate at home. Currently on hold.  10. Pressure ulcer medial sacrum bilateral buttocks.  Identified on 04/17/2019  not POA per documentation. Continue frequent dressing changes.  Pressure Injury 04/17/19 Sacrum Medial Deep Tissue Injury - Purple or maroon localized area of discolored intact skin or blood-filled blister due to damage of underlying soft tissue from pressure and/or shear. dark purple with some skin sloughing on lowe (Active)  04/17/19 0730  Location: Sacrum  Location Orientation: Medial  Staging: Deep Tissue Injury - Purple or maroon localized area of discolored intact skin or blood-filled blister due  to damage of underlying soft tissue from pressure and/or shear.  Wound Description (Comments): dark purple with some skin sloughing on lower sacral area  Present on Admission: No     Pressure Injury 04/17/19 Buttocks Right;Lower Unstageable - Full thickness tissue loss  in which the base of the ulcer is covered by slough (yellow, tan, gray, green or brown) and/or eschar (tan, brown or black) in the wound bed. (Active)  04/17/19 0730  Location: Buttocks  Location Orientation: Right;Lower  Staging: Unstageable - Full thickness tissue loss in which the base of the ulcer is covered by slough (yellow, tan, gray, green or brown) and/or eschar (tan, brown or black) in the wound bed.  Wound Description (Comments):   Present on Admission: No     Pressure Injury 04/17/19 Buttocks Left;Lower Deep Tissue Injury - Purple or maroon localized area of discolored intact skin or blood-filled blister due to damage of underlying soft tissue from pressure and/or shear. (Active)  04/17/19 0730  Location: Buttocks  Location Orientation: Left;Lower  Staging: Deep Tissue Injury - Purple or maroon localized area of discolored intact skin or blood-filled blister due to damage of underlying soft tissue from pressure and/or shear.  Wound Description (Comments):   Present on Admission: No     LDA Central line: left subclavian removed on 04/24/2019, PICC line placed on 04/24/2019 Foley catheter: 04/24/2019 Nutrition: cortrack Interventions: Tube feeding, Prostat  Diet: NPO tube feed DVT Prophylaxis: SCD, pharmacological prophylaxis contraindicated due to bleeding  Advance goals of care discussion: full code  Family Communication: No family was present at bedside, at the time of interview.  D/w daughter on 04/26/2019, Opportunity was given to ask question and all questions were answered satisfactorily.   Disposition:  Discharge to be determined .  Consultants: PCCM  Procedures: noe  Scheduled Meds: . arformoterol  15 mcg Nebulization BID  . aspirin  81 mg Per Tube Daily  . budesonide (PULMICORT) nebulizer solution  0.5 mg Nebulization BID  . chlorhexidine gluconate (MEDLINE KIT)  15 mL Mouth Rinse BID  . Chlorhexidine Gluconate Cloth  6 each Topical Daily  . Chlorhexidine  Gluconate Cloth  6 each Topical Daily  . feeding supplement (PRO-STAT SUGAR FREE 64)  30 mL Per Tube TID  . folic acid  1 mg Per Tube Daily  . free water  200 mL Per Tube Q8H  . insulin aspart  0-20 Units Subcutaneous Q4H  . insulin aspart  8 Units Subcutaneous Q4H  . insulin glargine  35 Units Subcutaneous BID  . ipratropium-albuterol  3 mL Nebulization TID  . mouth rinse  15 mL Mouth Rinse 10 times per day  . metoprolol tartrate  25 mg Per Tube BID  . multivitamin  15 mL Per Tube Daily  . pantoprazole sodium  40 mg Per Tube Daily  . sodium chloride flush  10-40 mL Intracatheter Q12H   Continuous Infusions: . ceFEPime (MAXIPIME) IV 2 g (04/26/19 1041)  . dexmedetomidine (PRECEDEX) IV infusion 0.3 mcg/kg/hr (04/26/19 1338)  . dextrose 50 mL/hr at 04/26/19 1044  . feeding supplement (VITAL AF 1.2 CAL) 70 mL/hr at 04/25/19 1800   PRN Meds: acetaminophen, dextrose, fentaNYL (SUBLIMAZE) injection, hydrALAZINE, HYDROmorphone, sodium chloride flush, Thrombi-Pad Antibiotics: Anti-infectives (From admission, onward)   Start     Dose/Rate Route Frequency Ordered Stop   04/25/19 1400  vancomycin (VANCOCIN) IVPB 1000 mg/200 mL premix     1,000 mg 200 mL/hr over 60 Minutes Intravenous  Once 04/25/19 1251 04/25/19 1456   04/25/19  1300  ceFEPIme (MAXIPIME) 2 g in sodium chloride 0.9 % 100 mL IVPB     2 g 200 mL/hr over 30 Minutes Intravenous Every 12 hours 04/25/19 1247     04/23/19 1921  vancomycin variable dose per unstable renal function (pharmacist dosing)  Status:  Discontinued      Does not apply See admin instructions 04/23/19 1921 04/25/19 1252   04/22/19 2200  dextrose 5 % 250 mL with vancomycin (VANCOCIN) 1,250 mg infusion  Status:  Discontinued     166.7 mL/hr  Intravenous Every 12 hours 04/22/19 1324 04/23/19 1921   04/20/19 1400  vancomycin (VANCOCIN) 1,250 mg in sodium chloride 0.9 % 250 mL IVPB  Status:  Discontinued     1,250 mg 166.7 mL/hr over 90 Minutes Intravenous Every 24  hours 04/19/19 1352 04/20/19 1215   04/20/19 1400  vancomycin (VANCOCIN) 1,250 mg in sodium chloride 0.9 % 250 mL IVPB  Status:  Discontinued     1,250 mg 166.7 mL/hr over 90 Minutes Intravenous Every 24 hours 04/20/19 1215 04/20/19 1220   04/20/19 1400  Vancomycin HCl in Dextrose 1.25-5 GM/250ML-% SOLN 1,250 mg  Status:  Discontinued     1,250 mg 166.7 mL/hr  Intravenous Every 24 hours 04/20/19 1220 04/20/19 1221   04/20/19 1400  Vancomycin 1291m in D5W  Status:  Discontinued     166.7 mL/hr  Intravenous Continuous 04/20/19 1230 04/20/19 1233   04/20/19 1400  Vancomycin 12552min D5W  Status:  Discontinued     166.7 mL/hr  Intravenous Every 24 hours 04/20/19 1233 04/22/19 1324   04/19/19 2200  piperacillin-tazobactam (ZOSYN) IVPB 3.375 g  Status:  Discontinued     3.375 g 12.5 mL/hr over 240 Minutes Intravenous Every 8 hours 04/19/19 1344 04/21/19 1533   04/19/19 1430  vancomycin (VANCOCIN) 2,000 mg in sodium chloride 0.9 % 500 mL IVPB     2,000 mg 250 mL/hr over 120 Minutes Intravenous  Once 04/19/19 1344 04/19/19 1806   04/19/19 1400  piperacillin-tazobactam (ZOSYN) IVPB 3.375 g     3.375 g 100 mL/hr over 30 Minutes Intravenous  Once 04/19/19 1344 04/19/19 1530   04/19/19 1000  azithromycin (ZITHROMAX) tablet 250 mg  Status:  Discontinued     250 mg Per Tube Daily 04/18/19 1246 04/20/19 1655   04/18/19 1300  azithromycin (ZITHROMAX) tablet 500 mg     500 mg Per Tube Daily 04/18/19 1246 04/18/19 1351   04/13/19 1000  remdesivir 100 mg in sodium chloride 0.9 % 250 mL IVPB     100 mg 500 mL/hr over 30 Minutes Intravenous Every 24 hours 04/12/19 0833 04/16/19 1103   04/12/19 1000  remdesivir 200 mg in sodium chloride 0.9 % 250 mL IVPB     200 mg 500 mL/hr over 30 Minutes Intravenous Once 04/12/19 0833 04/12/19 1317   04/12/19 0600  ceFEPIme (MAXIPIME) 2 g in sodium chloride 0.9 % 100 mL IVPB  Status:  Discontinued     2 g 200 mL/hr over 30 Minutes Intravenous Every 8 hours 04/11/19  2253 04/15/19 1452   04/12/19 0200  vancomycin (VANCOCIN) IVPB 750 mg/150 ml premix  Status:  Discontinued     750 mg 150 mL/hr over 60 Minutes Intravenous Every 12 hours 04/11/19 2253 04/12/19 1455   04/11/19 2130  ceFEPIme (MAXIPIME) 2 g in sodium chloride 0.9 % 100 mL IVPB     2 g 200 mL/hr over 30 Minutes Intravenous  Once 04/11/19 2125 04/11/19 2257   04/11/19 2130  metroNIDAZOLE (FLAGYL) IVPB 500 mg     500 mg 100 mL/hr over 60 Minutes Intravenous  Once 04/11/19 2125 04/11/19 2356   04/11/19 2130  vancomycin (VANCOCIN) IVPB 1000 mg/200 mL premix     1,000 mg 200 mL/hr over 60 Minutes Intravenous  Once 04/11/19 2125 04/12/19 0105       Objective: Physical Exam: Vitals:   04/26/19 1200 04/26/19 1243 04/26/19 1300 04/26/19 1400  BP: 122/61 (!) 126/56 (!) 132/58 128/63  Pulse: 88 88 88 89  Resp: (!) 30 (!) 34 (!) 34 (!) 32  Temp:  99.1 F (37.3 C)    TempSrc:      SpO2: 100% 99% 99% 97%  Weight:      Height:        Intake/Output Summary (Last 24 hours) at 04/26/2019 1435 Last data filed at 04/26/2019 1400 Gross per 24 hour  Intake 8480.83 ml  Output 2750 ml  Net 5730.83 ml   Filed Weights   04/23/19 0400 04/24/19 0500 04/25/19 0500  Weight: 104.9 kg 104.5 kg 103.2 kg   General: intubated and sedated Appear in moderate distress, affect flat in affect Eyes: PERRL, Conjunctiva normal ENT: Oral Mucosa Clear, dry  Neck: difficult to assess  JVD, no Abnormal Mass Or lumps Cardiovascular: S1 and S2 Present, no Murmur, peripheral pulses symmetrical Respiratory: normal respiratory effort, Bilateral Air entry equal and Decreased, no use of accessory muscle, bilateral  Crackles, no wheezes Abdomen: Bowel Sound present, Soft and no tenderness, no hernia Skin: no rashes  Extremities: no Pedal edema, no calf tenderness Neurologic: normal without focal findings, Gait not checked due to patient safety concerns  Data Reviewed: CBC: Recent Labs  Lab 04/22/19 0503 04/23/19 0501  04/24/19 0424 04/24/19 0512 04/25/19 0535 04/26/19 0520  WBC 15.2* 11.1*  --  9.5 6.8 6.4  NEUTROABS 13.0* 9.3*  --  7.9* 5.4 5.0  HGB 12.2* 11.9* 10.2* 11.2* 10.2* 9.7*  HCT 40.4 38.4* 30.0* 34.6* 32.5* 31.3*  MCV 98.5 95.0  --  94.3 95.9 97.5  PLT 133* 122*  --  115* 110* 245*   Basic Metabolic Panel: Recent Labs  Lab 04/22/19 0503 04/23/19 0501 04/23/19 1739 04/24/19 0424 04/24/19 0512 04/25/19 0535 04/26/19 0520  NA 145 147* 146* 147* 145 151* 153*  K 4.4 3.8 4.0 4.3 4.4 4.1 4.4  CL 111 112* 112*  --  111 118* 122*  CO2 _0 --  _1 GLUCOSE 195* 154* 256*  --  310* 259* 239*  BUN 54* 51* 66*  --  81* 89* 86*  CREATININE 1.15 1.19 1.77*  --  2.39* 2.06* 1.70*  CALCIUM 8.1* 8.4* 8.2*  --  7.9* 8.0* 8.2*  MG 2.8* 2.7*  --   --  3.1* 2.8* 2.7*    Liver Function Tests: Recent Labs  Lab 04/22/19 0503 04/23/19 0501 04/24/19 0512 04/25/19 0535 04/26/19 0520  AST 27 27 32 31 59*  ALT 67* 56* 50* 54* 88*  ALKPHOS 66 75 75 58 63  BILITOT 0.7 0.8 0.5 0.4 0.3  PROT 5.7* 6.0* 5.0* 5.2* 5.1*  ALBUMIN 2.7* 2.6* 2.2* 2.2* 2.2*   No results for input(s): LIPASE, AMYLASE in the last 168 hours. No results for input(s): AMMONIA in the last 168 hours. Coagulation Profile: No results for input(s): INR, PROTIME in the last 168 hours. Cardiac Enzymes: No results for input(s): CKTOTAL, CKMB, CKMBINDEX, TROPONINI in the last 168 hours. BNP (last 3 results) No results for input(s):  PROBNP in the last 8760 hours. CBG: Recent Labs  Lab 04/25/19 1943 04/25/19 2346 04/26/19 0335 04/26/19 0746 04/26/19 1224  GLUCAP 261* 259* 226* 126* 238*   Studies: No results found.   Time spent: 35 minutes  Author: Berle Mull, MD [facility] 04/26/2019 2:35 PM  To reach On-call, see care teams to locate the attending and reach out to them via [email or web address]. If 7PM-7AM, please contact night-coverage If you still have difficulty reaching the attending provider,  please page the [facility] (Director on Call) for [facility] on amion for assistance.

## 2019-04-26 NOTE — Progress Notes (Signed)
Updated patient's daughter, [name] via phone call. Put on speaker and able to speak to patient, stated she did not have access to facetime right now, but appreciated opportunity to speak to patient.

## 2019-04-27 LAB — CBC WITH DIFFERENTIAL/PLATELET
Abs Immature Granulocytes: 0.03 10*3/uL (ref 0.00–0.07)
Basophils Absolute: 0 10*3/uL (ref 0.0–0.1)
Basophils Relative: 1 %
Eosinophils Absolute: 0.3 10*3/uL (ref 0.0–0.5)
Eosinophils Relative: 4 %
HCT: 32.1 % — ABNORMAL LOW (ref 39.0–52.0)
Hemoglobin: 9.8 g/dL — ABNORMAL LOW (ref 13.0–17.0)
Immature Granulocytes: 0 %
Lymphocytes Relative: 12 %
Lymphs Abs: 0.8 10*3/uL (ref 0.7–4.0)
MCH: 29.6 pg (ref 26.0–34.0)
MCHC: 30.5 g/dL (ref 30.0–36.0)
MCV: 97 fL (ref 80.0–100.0)
Monocytes Absolute: 0.5 10*3/uL (ref 0.1–1.0)
Monocytes Relative: 7 %
Neutro Abs: 5.4 10*3/uL (ref 1.7–7.7)
Neutrophils Relative %: 76 %
Platelets: 123 10*3/uL — ABNORMAL LOW (ref 150–400)
RBC: 3.31 MIL/uL — ABNORMAL LOW (ref 4.22–5.81)
RDW: 17.2 % — ABNORMAL HIGH (ref 11.5–15.5)
WBC: 7 10*3/uL (ref 4.0–10.5)
nRBC: 0 % (ref 0.0–0.2)

## 2019-04-27 LAB — BASIC METABOLIC PANEL WITH GFR
Anion gap: 7 (ref 5–15)
BUN: 71 mg/dL — ABNORMAL HIGH (ref 8–23)
CO2: 26 mmol/L (ref 22–32)
Calcium: 8.2 mg/dL — ABNORMAL LOW (ref 8.9–10.3)
Chloride: 120 mmol/L — ABNORMAL HIGH (ref 98–111)
Creatinine, Ser: 1.38 mg/dL — ABNORMAL HIGH (ref 0.61–1.24)
GFR calc Af Amer: >60 mL/min
GFR calc non Af Amer: 53 mL/min — ABNORMAL LOW
Glucose, Bld: 293 mg/dL — ABNORMAL HIGH (ref 70–99)
Potassium: 4.7 mmol/L (ref 3.5–5.1)
Sodium: 153 mmol/L — ABNORMAL HIGH (ref 135–145)

## 2019-04-27 LAB — ABO/RH: ABO/RH(D): O POS

## 2019-04-27 LAB — C-REACTIVE PROTEIN: CRP: <0.8 mg/dL (ref <1.0)

## 2019-04-27 LAB — GLUCOSE, CAPILLARY
Glucose-Capillary: 241 mg/dL — ABNORMAL HIGH (ref 70–99)
Glucose-Capillary: 221 mg/dL — ABNORMAL HIGH (ref 70–99)
Glucose-Capillary: 244 mg/dL — ABNORMAL HIGH (ref 70–99)
Glucose-Capillary: 314 mg/dL — ABNORMAL HIGH (ref 70–99)
Glucose-Capillary: 342 mg/dL — ABNORMAL HIGH (ref 70–99)

## 2019-04-27 LAB — COMPREHENSIVE METABOLIC PANEL
ALT: 122 U/L — ABNORMAL HIGH (ref 0–44)
AST: 78 U/L — ABNORMAL HIGH (ref 15–41)
Albumin: 2.1 g/dL — ABNORMAL LOW (ref 3.5–5.0)
Alkaline Phosphatase: 68 U/L (ref 38–126)
Anion gap: 5 (ref 5–15)
BUN: 75 mg/dL — ABNORMAL HIGH (ref 8–23)
CO2: 27 mmol/L (ref 22–32)
Calcium: 8.3 mg/dL — ABNORMAL LOW (ref 8.9–10.3)
Chloride: 121 mmol/L — ABNORMAL HIGH (ref 98–111)
Creatinine, Ser: 1.41 mg/dL — ABNORMAL HIGH (ref 0.61–1.24)
GFR calc Af Amer: 60 mL/min — ABNORMAL LOW (ref >60)
GFR calc non Af Amer: 52 mL/min — ABNORMAL LOW (ref >60)
Glucose, Bld: 253 mg/dL — ABNORMAL HIGH (ref 70–99)
Potassium: 4.4 mmol/L (ref 3.5–5.1)
Sodium: 153 mmol/L — ABNORMAL HIGH (ref 135–145)
Total Bilirubin: 0.4 mg/dL (ref 0.3–1.2)
Total Protein: 5.3 g/dL — ABNORMAL LOW (ref 6.5–8.1)

## 2019-04-27 LAB — D-DIMER, QUANTITATIVE: D-Dimer, Quant: 4.92 ug/mL-FEU — ABNORMAL HIGH (ref 0.00–0.50)

## 2019-04-27 LAB — MAGNESIUM: Magnesium: 2.5 mg/dL — ABNORMAL HIGH (ref 1.7–2.4)

## 2019-04-27 LAB — PROCALCITONIN: Procalcitonin: 0.26 ng/mL

## 2019-04-27 MED ORDER — FUROSEMIDE 10 MG/ML IJ SOLN
40.0000 mg | Freq: Once | INTRAMUSCULAR | Status: AC
  Administered 2019-04-27: 40 mg via INTRAVENOUS
  Filled 2019-04-27: qty 4

## 2019-04-27 MED ORDER — GABAPENTIN 250 MG/5ML PO SOLN
100.0000 mg | Freq: Three times a day (TID) | ORAL | Status: DC
  Administered 2019-04-27 – 2019-05-05 (×24): 100 mg
  Filled 2019-04-27 (×29): qty 2

## 2019-04-27 MED ORDER — INSULIN GLARGINE 100 UNIT/ML ~~LOC~~ SOLN
45.0000 [IU] | Freq: Two times a day (BID) | SUBCUTANEOUS | Status: DC
  Administered 2019-04-27 – 2019-05-01 (×9): 45 [IU] via SUBCUTANEOUS
  Filled 2019-04-27 (×9): qty 0.45

## 2019-04-27 MED ORDER — SODIUM CHLORIDE 0.9 % IV SOLN
2.0000 g | Freq: Three times a day (TID) | INTRAVENOUS | Status: DC
  Administered 2019-04-27 – 2019-04-29 (×6): 2 g via INTRAVENOUS
  Filled 2019-04-27 (×6): qty 2

## 2019-04-27 MED ORDER — METOPROLOL TARTRATE 25 MG/10 ML ORAL SUSPENSION
25.0000 mg | Freq: Three times a day (TID) | ORAL | Status: DC
  Administered 2019-04-27 – 2019-05-05 (×23): 25 mg
  Filled 2019-04-27 (×23): qty 10

## 2019-04-27 MED ORDER — HEPARIN SODIUM (PORCINE) 10000 UNIT/ML IJ SOLN
7500.0000 [IU] | Freq: Three times a day (TID) | INTRAMUSCULAR | Status: DC
  Administered 2019-04-27 – 2019-05-05 (×24): 7500 [IU] via SUBCUTANEOUS
  Filled 2019-04-27 (×24): qty 1

## 2019-04-27 NOTE — Progress Notes (Signed)
Called patients daughter to update. She had no questions at this time.

## 2019-04-27 NOTE — Progress Notes (Signed)
Pharmacy Antibiotic Note  Robert [name] is a 68 y.o. male with PMH of COPD, CAD, DM, ischemic cardiomyopathy, RA who was admitted on 04/11/2019 with COVID-19 pneumonia. Pt developed an MRSA pneumonia being treated with vancomycin. Due to persistent fevers, gram negative coverage will be added with cefepime. Pharmacy has been consulted for cefepime and to continue vancomycin (beyond day 7 for MRSA pneumonia) dosing for ongoing sepsis.    WBC have improved to WNL, Tm 100, cultures are still in process.  SCr continues to improve 1.4.  Plan:  Continue Vancomycin IV 1750 mg IV q24h  Goal AUC = 400 - 550.  Est AUC 455 using SCr 1.4  Increase to cefepime 2g q8h  Monitor clinical picture, renal function, WBCs, and cultures.  Height: 6' (182.9 cm) Weight: 227 lb 8.2 oz (103.2 kg) IBW/kg (Calculated) : 77.6  Temp (24hrs), Avg:99.7 F (37.6 C), Min:99.1 F (37.3 C), Max:100 F (37.8 C)  Recent Labs  Lab 04/23/19 2100 04/24/19 0512 04/25/19 0535 04/26/19 0520 04/26/19 1355 04/27/19 0610  WBC  --  9.5 6.8 6.4 6.7 7.0  CREATININE  --  2.39* 2.06* 1.70* 1.55* 1.41*  VANCOTROUGH 36*  --   --   --   --   --   VANCORANDOM  --   --  18  --   --   --     Estimated Creatinine Clearance: 64 mL/min (A) (by C-G formula based on SCr of 1.41 mg/dL (H)).    Allergies  Allergen Reactions  . Ivp Dye [Iodinated Diagnostic Agents] Itching and Rash   Antimicrobials this admission: 7/31 vancomycin >> 8/1, resumed 8/8 >>  7/31 cefepime >> 8/5, resumed 8/14>> 7/31 metronidazole x1 8/1 Remdesivir >> 8/5 8/1 Actemra (PTA tocilizumab 162mg  SQ weekly on Mondays for RA)  8/7 Azithro >> 8/9 8/8 Zosyn >> 8/10  Dose adjustments this admission: 8/11 empiric renal adjustment. 8/12 VT - 36; Hold vanc  8/14 VR -59; Vanc x1  Microbiology results: 7/31 [facility] x2: NGF 7/31 UCx: NGF 7/31 SARS CoV-2: positive 8/1 Ta: rare normal flora 8/1 MRSA PCR: neg 8/7 [id number]  8/7 BCx: NGF 8/8 UCx:  NGF 8/14 Bcx: ngtd  8/14 UCx: ngtd 8/15 TA:  few GPC cluster, few GNR, rare GPR, rare yeast.  Cxt pending   Thank you for allowing pharmacy to be a part of this patient's care.  Gretta Arab PharmD, BCPS Clinical pharmacist phone 7am- 5pm: [phone] 04/27/2019 9:55 AM

## 2019-04-27 NOTE — Progress Notes (Signed)
NAME:  Robert [name], MRN:  [id number], DOB:  [birth year], LOS: 94 ADMISSION DATE:  04/11/2019, CONSULTATION DATE:  8/1 REFERRING MD:  Dr. Roderic Palau, CHIEF COMPLAINT:  dyspnea   Brief History   68 year old male admitted on 8/1 in the setting of ARDS from COVID-19 pneumonia. Initially presented to [facility] on 7/31 and required intubation. Post-procedure in the ED, patient awakened and self-extubated and required re-intubation. At [facility], tolerated PS and subsequently extubated on 8/2 however required intubation within 24 hours. Hospital course complicated by septic shock 2/2 MRSA pneumonia and volume overload.   Past Medical History  Anxiety COPD GERD CAD Hypertension Hyperlipidemia Rheumatoid arthritis History of systolic heart failure, however 08/2018 Echo showed LVEF 50-55%, left atrium dilated  Significant Hospital Events   8/1 admission to [facility] ICU 8/2 extubated 8/3 early AM agitated, placed on BIPAP, required re-intubation 8/4 started on steroids for COPD exacerbation 8/8 septic shock requiring pressor support 8/9 off pressors, respiratory cultures +s.aureus 8/10 persistent encephalopathy off sedation, CT head ordered 8/11 encephalopathy improved 8/12 difficulty weaning due to tachypnea, ETT exchanged due to dried blood clot burden  Consults:  PCCM  Procedures:  8/1 ETT > 8/2, 8/3 >  8/1 L subclavian CVL > 8/13 8/13 Rt PICC  Significant Diagnostic Tests:  08/2018 Echo> LVEF 50-55%, left atrium dilated  CT head 8/11- mild age-related atrophy and chronic microvascular ischemic changes.  Old lacunar infarct.  Micro Data:  7/31 blood > NGTD 7/31 sars-cov-2 > positive 8/1 Resp CX > Normal flora 8/7 Resp CX > MRSA 8/7 BCX > NGTD 8/8 UCX > NGTD  Antimicrobials:  Cefepime 7/31 > 8/4  Flagyl 7/31  Vanc 7/31, 8/8>  Remdesivir 8/1 > 8/5  Actemra 8/1  Decadron/solumedrol 8/1 >8/8  Zosyn 8/8>8/9 Azithro 8/7>8/9   Interim history/subjective:  Weaning 10/5.  No acute  events.  Objective   Blood pressure (!) 148/68, pulse (!) 109, temperature 99.8 F (37.7 C), temperature source Oral, resp. rate (!) 33, height 6' (1.829 m), weight 103.2 kg, SpO2 96 %. CVP:  [0 mmHg-18 mmHg] 2 mmHg  Vent Mode: CPAP;PSV FiO2 (%):  [30 %] 30 % Set Rate:  [26 bmp] 26 bmp Vt Set:  [460 mL] 460 mL PEEP:  [5 cmH20] 5 cmH20 Pressure Support:  [5 cmH20-10 cmH20] 10 cmH20 Plateau Pressure:  [16 cmH20-20 cmH20] 16 cmH20   Intake/Output Summary (Last 24 hours) at 04/27/2019 0848 Last data filed at 04/27/2019 0800 Gross per 24 hour  Intake 4303.12 ml  Output 2625 ml  Net 1678.12 ml   Filed Weights   04/23/19 0400 04/24/19 0500 04/25/19 0500  Weight: 104.9 kg 104.5 kg 103.2 kg   Physical Exam: Gen:      No acute distress HEENT:  EOMI, sclera anicteric, ETT Neck:     No masses; no thyromegaly Lungs:    Clear to auscultation bilaterally; normal respiratory effort CV:         Regular rate and rhythm; no murmurs Abd:      + bowel sounds; soft, non-tender; no palpable masses, no distension Ext:    No edema; adequate peripheral perfusion Skin:      Warm and dry; no rash Neuro: Nonresponsive, looks uncomfortable on the vent  Resolved Hospital Problem list   Septic shock 2/2 S. 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 Mental status  remains barrier for extubation.  Plan Continue vancomycin Pressure support weans as tolerated Continue bronchodilators, flutter valve  Acute encephalopathy Wean off sedation. PRN fentanyl, Dilaudid.   Hypernatremia AKI Free water Monitor creatinine.   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  Best practice:  Diet: tube feeding  Pain/Anxiety/Delirium protocol (if indicated): as above VAP protocol (if indicated): yes DVT prophylaxis: Lovenox  GI prophylaxis: Pantoprazole for stress ulcer prophylaxis Glucose control: SSI Mobility: bed rest Code Status: full Family Communication: Per TRH Disposition: remain in ICU  Critical care time: 35 mins   The patient is critically ill with multiple organ system failure and requires high complexity decision making for assessment and support, frequent evaluation and titration of therapies, advanced monitoring, review of radiographic studies and interpretation of complex data.   Marshell Garfinkel MD [facility] Pager [phone] If no answer call [phone] 04/27/2019, 8:48 AM   .

## 2019-04-27 NOTE — Progress Notes (Addendum)
[facility] Progress Note  Patient: Robert [name] XNA:[id number]   PCP: Redmond School, MD DOB: 1950/12/30   DOA: 04/11/2019   DOS: 04/27/2019   Date of Service: the patient was seen and examined on 04/27/2019  Brief hospital course: Pt. with PMH of COPD, coronary artery disease, diabetes, ischemic cardiomyopathy, rheumatoid arthritis; admitted on 04/11/2019, Presents to the hospital with complaints of progressive worsening shortness of breath. He was mildly febrile and had worsening shortness of breath so he called EMS. In the ER, he was noted to be substantially hypoxic, had mental status changes. He tested positive for COVID-19. He was intubated in the emergency room for persistent hypoxia. He has been transferred to [facility] for further management.  Currently further plan is continue to support breathing.  Significant events: 8/1 admission to [facility] ICU 8/2 extubated 8/3 early AM agitated, placed on BIPAP, required re-intubation 8/4 started on steroids for COPD exacerbation 8/8 septic shock requiring pressor support 8/9 off pressors, respiratory cultures +s.aureus 8/10 persistent encephalopathy off sedation, CT head ordered 8/11 encephalopathy improved 8/12 difficulty weaning due to tachypnea, ETT exchanged due to clot burden 8/13 PICC line inserted and subclavian central line removed 8/14 Antibiotics coverage broaden due to persistent fever and resp distress, pan cultured   COVID 19 specific treatment: Vitamin C and Zinc: started on 04/26/2019 DVT Prophylaxis: SCD, pharmacological prophylaxis contraindicated due to active bleeding issues last 65 huors resume 04/27/19  Remdesivir: completed 04/16/2019 Steroids: off since 04/19/2019 Actemra: received off-label Actemra on 04/12/2019  Subjective: Low-grade temp of 100.  Blood sugars running high.  Weaned for whole day yesterday.  No other acute event.  Assessment and Plan: 1. Acute COVID-19 Viral illness  Lab  Results  Component Value Date   SARSCOV2NAA POSITIVE (A) 04/11/2019   SARSCOV2NAA Detected (A) 04/11/2019   CXR: hazy bilateral peripheral opacities  Recent Labs    04/25/19 0535 04/26/19 0519 04/26/19 0520 04/27/19 0610  DDIMER 2.65*  --  2.94* 4.92*  CRP 0.8 0.9  --  <0.8    Tmax last 24 hours:  Temp (24hrs), Avg:99.5 F (37.5 C), Min:98.8 F (37.1 C), Max:100 F (37.8 C)  Oxygen requirements: Vent Mode: PRVC FiO2 (%):  [30 %] 30 % Set Rate:  [26 bmp] 26 bmp Vt Set:  [460 mL] 460 mL PEEP:  [5 cmH20] 5 cmH20 Pressure Support:  [5 cmH20-8 cmH20] 8 cmH20 Plateau Pressure:  [13 cmH20-20 cmH20] 20 cmH20   Antibiotics:on vancomycin and cefepime again starting 04/25/2019, completed course for MRSA pneumonia Diuretics: PRN lasix, none 04/26/19, net negative in last 24 hours, error in documentation  Prone positioning: Patient encouraged to stay in prone position as much as possible.  This patient has confirmed COVID-19 in the setting of the ongoing 2020 coronavirus pandemic.  He has hypoxia and is high-risk for intubation, (due to age/BMI >35/CAD/CRP > 14 mg/dL, or troponin >=0.1 ng/dL) but expected to survive >48 hours and has good baseline functional status.  he is not known to be on immunomodulators, anti-rejection medications, or cancer chemotherapy, has no history of TB or latent TB, and no history of diverticulitis or intestinal perforation.  Platelets are >50K, ANC is >500, and ALT/AST are below 5x ULN with no known hepatitis B infection. The investigational nature of this medication was discussed with the patient/HCPOA and they choose to proceed as the potential benefits are felt to outweigh risks at this time.  PPE During this encounter: Patient Isolation: Airborne + Droplet + Contact HCP PPE: CAPR,  gown. gloves Patient PPE: None  The treatment plan and use of medications and known side effects were discussed with patient/family. It was clearly explained that there is no  proven definitive treatment for COVID-19 infection yet. Any medications used here are based on case reports/anecdotal data which are not peer-reviewed and has not been studied using randomized control trials.  Complete risks and long-term side effects are unknown, however in the best clinical judgment they seem to be of some clinical benefit rather than medical risks.  Patient/family agree with the treatment plan and want to receive these treatments as indicated.   2.  MRSA healthcare associated pneumonia. Septic shock.  POA Concern for active infection on 04/25/2019. Required pressors now off. Started on IV vancomycin and Zosyn and azithromycin from 04/19/2019. Sputum culture came back positive for MRSA.  Completed treatment for 7 days IV vancomycin Blood cultures so far negative.  Broadening the coverage due to persistent fevers 04/25/2019. Blood cultures so far negative.  ET aspirate Gram stain FEW GRAM POSITIVE COCCI IN CLUSTERS FEW GRAM NEGATIVE RODS RARE GRAM POSITIVE RODS RARE YEAST  Continue for now.  We will decide on the duration once ET aspirate culture finalized. Procalcitonin level trending from 0.7> 0.2  3.  Paroxysmal A. fib with RVR. Currently in normal sinus rhythm. On metoprolol 25 mg twice daily. [place] Vasc score of 5.   Patient was on IV heparin.  Currently anticoagulation is on hold due to bleeding.  We will resume once we verify no active bleeding after resumption of aspirin and Plavix. Continue to monitor.  4.  Acute on chronic diastolic CHF. Essential hypertension. History of CAD Continue Lopressor. Continue PRN IV diuresis. PCI 2018.  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 Discussed with cardiology on 04/24/2019.  Given active bleeding from subclavian line as well as ET tube currently holding off on anticoagulation. Resume aspirin and if no further bleeding add Plavix in 2 to 3 days. Resume aspirin today, and heparin s/c 04/27/19, plavix can be resumed on  Monday  5.  Endotracheal tube clotting. Resolved  Bleeding from the subclavian line. Anemia clinically undetermined etiology  Patient was on therapeutic heparin. ET tube was clotted.  Exchange on 04/23/2019. Currently no anticoagulation. Continue SCDs H&H with slow down trend in last 2 days.  6.  Hypernatremia.  Hyperchloremia .  Current issue. Continue free water, 200 q8. Resume D5. Monitor. Recheck later.   7.  Acute kidney injury. Urinary retention.  Likely combination of diuresis as well as vancomycin induced injury. Baseline serum creatinine 0.80. Progressively worsened during the hospital course. Normalized again around 04/22/2019. Worsened to 2.39 with BUN of 81 on 04/24/2019. Retention was seen on 08/13, needed foley catheter.  Also monitoring vancomycin dose. there was 5.7 litre error in documentation on free water on 04/26/2019.  8.  Type 2 diabetes uncontrolled with hyperglycemia. With vascular disease and neuropathy at baseline. On gabapentin at home. Glipizide acarbose metformin 1000 mg twice daily at home. Currently hyperglycemic in the hospital. Tube feed coverage insulin 8 units every 4 hours. Lantus 45 units twice daily. Resistant insulin sliding scale every 4 hours.  9.  Rheumatoid arthritis. Patient is on 6 Actemra and methotrexate at home. Currently on hold.  10. Pressure ulcer medial sacrum bilateral buttocks.  Identified on 04/17/2019  not POA per documentation. Continue frequent dressing changes.  Pressure Injury 04/17/19 Sacrum Medial Deep Tissue Injury - Purple or maroon localized area of discolored intact skin or blood-filled blister due to damage of underlying  soft tissue from pressure and/or shear. dark purple with some skin sloughing on lowe (Active)  04/17/19 0730  Location: Sacrum  Location Orientation: Medial  Staging: Deep Tissue Injury - Purple or maroon localized area of discolored intact skin or blood-filled blister due to damage of  underlying soft tissue from pressure and/or shear.  Wound Description (Comments): dark purple with some skin sloughing on lower sacral area  Present on Admission: No     Pressure Injury 04/17/19 Buttocks Right;Lower Unstageable - Full thickness tissue loss in which the base of the ulcer is covered by slough (yellow, tan, gray, green or brown) and/or eschar (tan, brown or black) in the wound bed. (Active)  04/17/19 0730  Location: Buttocks  Location Orientation: Right;Lower  Staging: Unstageable - Full thickness tissue loss in which the base of the ulcer is covered by slough (yellow, tan, gray, green or brown) and/or eschar (tan, brown or black) in the wound bed.  Wound Description (Comments):   Present on Admission: No     Pressure Injury 04/17/19 Buttocks Left;Lower Deep Tissue Injury - Purple or maroon localized area of discolored intact skin or blood-filled blister due to damage of underlying soft tissue from pressure and/or shear. (Active)  04/17/19 0730  Location: Buttocks  Location Orientation: Left;Lower  Staging: Deep Tissue Injury - Purple or maroon localized area of discolored intact skin or blood-filled blister due to damage of underlying soft tissue from pressure and/or shear.  Wound Description (Comments):   Present on Admission: No     LDA Central line: left subclavian removed on 04/24/2019, PICC line placed on 04/24/2019 Foley catheter: 04/24/2019 Nutrition: cortrack  Interventions: Tube feeding, Prostat  Diet: NPO tube feed DVT Prophylaxis: SCD, pharmacological prophylaxis contraindicated due to bleeding  Advance goals of care discussion: full code  Family Communication: No family was present at bedside, at the time of interview.  D/w daughter on 04/27/2019, Opportunity was given to ask question and all questions were answered satisfactorily.   Disposition:  Discharge to be determined .  Consultants: PCCM  Procedures: noe  Scheduled Meds: . arformoterol  15  mcg Nebulization BID  . aspirin  81 mg Per Tube Daily  . budesonide (PULMICORT) nebulizer solution  0.5 mg Nebulization BID  . chlorhexidine gluconate (MEDLINE KIT)  15 mL Mouth Rinse BID  . Chlorhexidine Gluconate Cloth  6 each Topical Daily  . Chlorhexidine Gluconate Cloth  6 each Topical Daily  . feeding supplement (PRO-STAT SUGAR FREE 64)  30 mL Per Tube TID  . folic acid  1 mg Per Tube Daily  . free water  200 mL Per Tube Q8H  . insulin aspart  0-20 Units Subcutaneous Q4H  . insulin aspart  8 Units Subcutaneous Q4H  . insulin glargine  45 Units Subcutaneous BID  . ipratropium-albuterol  3 mL Nebulization TID  . mouth rinse  15 mL Mouth Rinse 10 times per day  . metoprolol tartrate  25 mg Per Tube BID  . multivitamin  15 mL Per Tube Daily  . pantoprazole sodium  40 mg Per Tube Daily  . sodium chloride flush  10-40 mL Intracatheter Q12H  . vitamin C  500 mg Per Tube Daily  . zinc sulfate  220 mg Per Tube Daily   Continuous Infusions: . ceFEPime (MAXIPIME) IV Stopped (04/26/19 2206)  . dexmedetomidine (PRECEDEX) IV infusion 0.3 mcg/kg/hr (04/27/19 0700)  . dextrose 50 mL/hr at 04/27/19 0700  . feeding supplement (VITAL AF 1.2 CAL) 70 mL/hr at 04/25/19 1800  .  vancomycin Stopped (04/26/19 1857)   PRN Meds: acetaminophen, dextrose, fentaNYL (SUBLIMAZE) injection, hydrALAZINE, HYDROmorphone, sodium chloride flush, Thrombi-Pad Antibiotics: Anti-infectives (From admission, onward)   Start     Dose/Rate Route Frequency Ordered Stop   04/26/19 1630  vancomycin (VANCOCIN) 1,750 mg in sodium chloride 0.9 % 500 mL IVPB     1,750 mg 250 mL/hr over 120 Minutes Intravenous Every 24 hours 04/26/19 1623     04/25/19 1400  vancomycin (VANCOCIN) IVPB 1000 mg/200 mL premix     1,000 mg 200 mL/hr over 60 Minutes Intravenous  Once 04/25/19 1251 04/25/19 1456   04/25/19 1300  ceFEPIme (MAXIPIME) 2 g in sodium chloride 0.9 % 100 mL IVPB     2 g 200 mL/hr over 30 Minutes Intravenous Every 12 hours  04/25/19 1247     04/23/19 1921  vancomycin variable dose per unstable renal function (pharmacist dosing)  Status:  Discontinued      Does not apply See admin instructions 04/23/19 1921 04/25/19 1252   04/22/19 2200  dextrose 5 % 250 mL with vancomycin (VANCOCIN) 1,250 mg infusion  Status:  Discontinued     166.7 mL/hr  Intravenous Every 12 hours 04/22/19 1324 04/23/19 1921   04/20/19 1400  vancomycin (VANCOCIN) 1,250 mg in sodium chloride 0.9 % 250 mL IVPB  Status:  Discontinued     1,250 mg 166.7 mL/hr over 90 Minutes Intravenous Every 24 hours 04/19/19 1352 04/20/19 1215   04/20/19 1400  vancomycin (VANCOCIN) 1,250 mg in sodium chloride 0.9 % 250 mL IVPB  Status:  Discontinued     1,250 mg 166.7 mL/hr over 90 Minutes Intravenous Every 24 hours 04/20/19 1215 04/20/19 1220   04/20/19 1400  Vancomycin HCl in Dextrose 1.25-5 GM/250ML-% SOLN 1,250 mg  Status:  Discontinued     1,250 mg 166.7 mL/hr  Intravenous Every 24 hours 04/20/19 1220 04/20/19 1221   04/20/19 1400  Vancomycin 1275m in D5W  Status:  Discontinued     166.7 mL/hr  Intravenous Continuous 04/20/19 1230 04/20/19 1233   04/20/19 1400  Vancomycin 12545min D5W  Status:  Discontinued     166.7 mL/hr  Intravenous Every 24 hours 04/20/19 1233 04/22/19 1324   04/19/19 2200  piperacillin-tazobactam (ZOSYN) IVPB 3.375 g  Status:  Discontinued     3.375 g 12.5 mL/hr over 240 Minutes Intravenous Every 8 hours 04/19/19 1344 04/21/19 1533   04/19/19 1430  vancomycin (VANCOCIN) 2,000 mg in sodium chloride 0.9 % 500 mL IVPB     2,000 mg 250 mL/hr over 120 Minutes Intravenous  Once 04/19/19 1344 04/19/19 1806   04/19/19 1400  piperacillin-tazobactam (ZOSYN) IVPB 3.375 g     3.375 g 100 mL/hr over 30 Minutes Intravenous  Once 04/19/19 1344 04/19/19 1530   04/19/19 1000  azithromycin (ZITHROMAX) tablet 250 mg  Status:  Discontinued     250 mg Per Tube Daily 04/18/19 1246 04/20/19 1655   04/18/19 1300  azithromycin (ZITHROMAX) tablet 500 mg      500 mg Per Tube Daily 04/18/19 1246 04/18/19 1351   04/13/19 1000  remdesivir 100 mg in sodium chloride 0.9 % 250 mL IVPB     100 mg 500 mL/hr over 30 Minutes Intravenous Every 24 hours 04/12/19 0833 04/16/19 1103   04/12/19 1000  remdesivir 200 mg in sodium chloride 0.9 % 250 mL IVPB     200 mg 500 mL/hr over 30 Minutes Intravenous Once 04/12/19 0833 04/12/19 1317   04/12/19 0600  ceFEPIme (MAXIPIME) 2 g  in sodium chloride 0.9 % 100 mL IVPB  Status:  Discontinued     2 g 200 mL/hr over 30 Minutes Intravenous Every 8 hours 04/11/19 2253 04/15/19 1452   04/12/19 0200  vancomycin (VANCOCIN) IVPB 750 mg/150 ml premix  Status:  Discontinued     750 mg 150 mL/hr over 60 Minutes Intravenous Every 12 hours 04/11/19 2253 04/12/19 1455   04/11/19 2130  ceFEPIme (MAXIPIME) 2 g in sodium chloride 0.9 % 100 mL IVPB     2 g 200 mL/hr over 30 Minutes Intravenous  Once 04/11/19 2125 04/11/19 2257   04/11/19 2130  metroNIDAZOLE (FLAGYL) IVPB 500 mg     500 mg 100 mL/hr over 60 Minutes Intravenous  Once 04/11/19 2125 04/11/19 2356   04/11/19 2130  vancomycin (VANCOCIN) IVPB 1000 mg/200 mL premix     1,000 mg 200 mL/hr over 60 Minutes Intravenous  Once 04/11/19 2125 04/12/19 0105       Objective: Physical Exam: Vitals:   04/27/19 0500 04/27/19 0530 04/27/19 0600 04/27/19 0630  BP: 122/60 130/63 (!) 142/69 133/68  Pulse: 87 87 89 90  Resp: (!) 25 20 (!) 22 (!) 28  Temp:      TempSrc:      SpO2: 98% 97% 98% 100%  Weight:      Height:        Intake/Output Summary (Last 24 hours) at 04/27/2019 7711 Last data filed at 04/27/2019 0700 Gross per 24 hour  Intake 3375.18 ml  Output 2625 ml  Net 750.18 ml   Filed Weights   04/23/19 0400 04/24/19 0500 04/25/19 0500  Weight: 104.9 kg 104.5 kg 103.2 kg   General: intubated and sedated Appear in moderate distress, affect flat in affect Eyes: PERRL, Conjunctiva normal ENT: Oral Mucosa Clear, dry  Neck: difficult to assess  JVD, no Abnormal Mass  Or lumps Cardiovascular: S1 and S2 Present, no Murmur, peripheral pulses symmetrical Respiratory: normal respiratory effort, Bilateral Air entry equal and Decreased, no use of accessory muscle, bilateral  Crackles, no wheezes Abdomen: Bowel Sound present, Soft and no tenderness, no hernia Skin: no rashes  Extremities: no Pedal edema, no calf tenderness Neurologic: normal without focal findings, Gait not checked due to patient safety concerns  Data Reviewed: CBC: Recent Labs  Lab 04/23/19 0501  04/24/19 0512 04/25/19 0535 04/26/19 0520 04/26/19 1355 04/27/19 0610  WBC 11.1*  --  9.5 6.8 6.4 6.7 7.0  NEUTROABS 9.3*  --  7.9* 5.4 5.0  --  5.4  HGB 11.9*   < > 11.2* 10.2* 9.7* 9.5* 9.8*  HCT 38.4*   < > 34.6* 32.5* 31.3* 30.5* 32.1*  MCV 95.0  --  94.3 95.9 97.5 97.4 97.0  PLT 122*  --  115* 110* 102* 92* 123*   < > = values in this interval not displayed.   Basic Metabolic Panel: Recent Labs  Lab 04/23/19 0501  04/24/19 0512 04/25/19 0535 04/26/19 0520 04/26/19 1355 04/27/19 0610  NA 147*   < > 145 151* 153* 147* 153*  K 3.8   < > 4.4 4.1 4.4 4.2 4.4  CL 112*   < > 111 118* 122* 116* 121*  CO2 27   < > _0 GLUCOSE 154*   < > 310* 259* 239* 415* 253*  BUN 51*   < > 81* 89* 86* 77* 75*  CREATININE 1.19   < > 2.39* 2.06* 1.70* 1.55* 1.41*  CALCIUM 8.4*   < > 7.9*  8.0* 8.2* 7.9* 8.3*  MG 2.7*  --  3.1* 2.8* 2.7*  --  2.5*   < > = values in this interval not displayed.    Liver Function Tests: Recent Labs  Lab 04/23/19 0501 04/24/19 0512 04/25/19 0535 04/26/19 0520 04/27/19 0610  AST 27 32 31 59* 78*  ALT 56* 50* 54* 88* 122*  ALKPHOS 75 75 58 63 68  BILITOT 0.8 0.5 0.4 0.3 0.4  PROT 6.0* 5.0* 5.2* 5.1* 5.3*  ALBUMIN 2.6* 2.2* 2.2* 2.2* 2.1*   No results for input(s): LIPASE, AMYLASE in the last 168 hours. No results for input(s): AMMONIA in the last 168 hours. Coagulation Profile: No results for input(s): INR, PROTIME in the last 168 hours.  Cardiac Enzymes: No results for input(s): CKTOTAL, CKMB, CKMBINDEX, TROPONINI in the last 168 hours. BNP (last 3 results) No results for input(s): PROBNP in the last 8760 hours. CBG: Recent Labs  Lab 04/26/19 1224 04/26/19 1607 04/26/19 1934 04/26/19 2312 04/27/19 0322  GLUCAP 238* 235* 222* 278* 241*   Studies: No results found.   Time spent: 35 minutes  Author: Berle Mull, MD [facility] 04/27/2019 8:24 AM  To reach On-call, see care teams to locate the attending and reach out to them via [email or web address]. If 7PM-7AM, please contact night-coverage If you still have difficulty reaching the attending provider, please page the [facility] (Director on Call) for [facility] on amion for assistance.

## 2019-04-28 LAB — CBC WITH DIFFERENTIAL/PLATELET
Abs Immature Granulocytes: 0.04 10*3/uL (ref 0.00–0.07)
Basophils Absolute: 0 10*3/uL (ref 0.0–0.1)
Basophils Relative: 1 %
Eosinophils Absolute: 0.3 10*3/uL (ref 0.0–0.5)
Eosinophils Relative: 4 %
HCT: 32.1 % — ABNORMAL LOW (ref 39.0–52.0)
Hemoglobin: 9.7 g/dL — ABNORMAL LOW (ref 13.0–17.0)
Immature Granulocytes: 1 %
Lymphocytes Relative: 12 %
Lymphs Abs: 0.9 10*3/uL (ref 0.7–4.0)
MCH: 29.9 pg (ref 26.0–34.0)
MCHC: 30.2 g/dL (ref 30.0–36.0)
MCV: 99.1 fL (ref 80.0–100.0)
Monocytes Absolute: 0.5 10*3/uL (ref 0.1–1.0)
Monocytes Relative: 7 %
Neutro Abs: 5.3 10*3/uL (ref 1.7–7.7)
Neutrophils Relative %: 75 %
Platelets: 126 10*3/uL — ABNORMAL LOW (ref 150–400)
RBC: 3.24 MIL/uL — ABNORMAL LOW (ref 4.22–5.81)
RDW: 17.5 % — ABNORMAL HIGH (ref 11.5–15.5)
WBC: 7 10*3/uL (ref 4.0–10.5)
nRBC: 0 % (ref 0.0–0.2)

## 2019-04-28 LAB — GLUCOSE, CAPILLARY
Glucose-Capillary: 282 mg/dL — ABNORMAL HIGH (ref 70–99)
Glucose-Capillary: 225 mg/dL — ABNORMAL HIGH (ref 70–99)
Glucose-Capillary: 169 mg/dL — ABNORMAL HIGH (ref 70–99)
Glucose-Capillary: 227 mg/dL — ABNORMAL HIGH (ref 70–99)
Glucose-Capillary: 241 mg/dL — ABNORMAL HIGH (ref 70–99)
Glucose-Capillary: 179 mg/dL — ABNORMAL HIGH (ref 70–99)

## 2019-04-28 LAB — COMPREHENSIVE METABOLIC PANEL
ALT: 117 U/L — ABNORMAL HIGH (ref 0–44)
AST: 60 U/L — ABNORMAL HIGH (ref 15–41)
Albumin: 2.2 g/dL — ABNORMAL LOW (ref 3.5–5.0)
Alkaline Phosphatase: 72 U/L (ref 38–126)
Anion gap: 6 (ref 5–15)
BUN: 71 mg/dL — ABNORMAL HIGH (ref 8–23)
CO2: 27 mmol/L (ref 22–32)
Calcium: 8.3 mg/dL — ABNORMAL LOW (ref 8.9–10.3)
Chloride: 121 mmol/L — ABNORMAL HIGH (ref 98–111)
Creatinine, Ser: 1.31 mg/dL — ABNORMAL HIGH (ref 0.61–1.24)
GFR calc Af Amer: >60 mL/min (ref >60)
GFR calc non Af Amer: 56 mL/min — ABNORMAL LOW (ref >60)
Glucose, Bld: 226 mg/dL — ABNORMAL HIGH (ref 70–99)
Potassium: 4.7 mmol/L (ref 3.5–5.1)
Sodium: 154 mmol/L — ABNORMAL HIGH (ref 135–145)
Total Bilirubin: 0.4 mg/dL (ref 0.3–1.2)
Total Protein: 5.5 g/dL — ABNORMAL LOW (ref 6.5–8.1)

## 2019-04-28 LAB — BASIC METABOLIC PANEL
Anion gap: 7 (ref 5–15)
Anion gap: 5 (ref 5–15)
BUN: 70 mg/dL — ABNORMAL HIGH (ref 8–23)
BUN: 57 mg/dL — ABNORMAL HIGH (ref 8–23)
CO2: 25 mmol/L (ref 22–32)
CO2: 21 mmol/L — ABNORMAL LOW (ref 22–32)
Calcium: 8.2 mg/dL — ABNORMAL LOW (ref 8.9–10.3)
Calcium: 6.9 mg/dL — ABNORMAL LOW (ref 8.9–10.3)
Chloride: 120 mmol/L — ABNORMAL HIGH (ref 98–111)
Chloride: 116 mmol/L — ABNORMAL HIGH (ref 98–111)
Creatinine, Ser: 1.33 mg/dL — ABNORMAL HIGH (ref 0.61–1.24)
Creatinine, Ser: 1.09 mg/dL (ref 0.61–1.24)
GFR calc Af Amer: >60 mL/min (ref >60)
GFR calc Af Amer: >60 mL/min (ref >60)
GFR calc non Af Amer: 55 mL/min — ABNORMAL LOW (ref >60)
GFR calc non Af Amer: >60 mL/min (ref >60)
Glucose, Bld: 371 mg/dL — ABNORMAL HIGH (ref 70–99)
Glucose, Bld: 480 mg/dL — ABNORMAL HIGH (ref 70–99)
Potassium: 4.6 mmol/L (ref 3.5–5.1)
Potassium: 3.8 mmol/L (ref 3.5–5.1)
Sodium: 152 mmol/L — ABNORMAL HIGH (ref 135–145)
Sodium: 142 mmol/L (ref 135–145)

## 2019-04-28 LAB — C-REACTIVE PROTEIN: CRP: <0.8 mg/dL (ref <1.0)

## 2019-04-28 LAB — MAGNESIUM: Magnesium: 2.4 mg/dL (ref 1.7–2.4)

## 2019-04-28 LAB — D-DIMER, QUANTITATIVE: D-Dimer, Quant: 4.56 ug/mL-FEU — ABNORMAL HIGH (ref 0.00–0.50)

## 2019-04-28 MED ORDER — INSULIN ASPART 100 UNIT/ML ~~LOC~~ SOLN
12.0000 [IU] | SUBCUTANEOUS | Status: DC
  Administered 2019-04-28 – 2019-05-01 (×19): 12 [IU] via SUBCUTANEOUS

## 2019-04-28 MED ORDER — EPINEPHRINE 1 MG/10ML IJ SOSY
PREFILLED_SYRINGE | INTRAMUSCULAR | Status: AC
  Filled 2019-04-28: qty 10

## 2019-04-28 MED ORDER — CLOPIDOGREL BISULFATE 75 MG PO TABS
75.0000 mg | ORAL_TABLET | Freq: Every day | ORAL | Status: DC
  Administered 2019-04-28 – 2019-04-29 (×2): 75 mg
  Filled 2019-04-28 (×2): qty 1

## 2019-04-28 NOTE — Progress Notes (Signed)
NAME:  FRANCISCA HARBUCK, MRN:  [id number], DOB:  [birth year], LOS: 64 ADMISSION DATE:  04/11/2019, CONSULTATION DATE:  8/1 REFERRING MD:  Dr. Roderic Palau, CHIEF COMPLAINT:  dyspnea   Brief History   68 year old male admitted on 8/1 in the setting of ARDS from COVID-19 pneumonia. Initially presented to [facility] on 7/31 and required intubation. Post-procedure in the ED, patient awakened and self-extubated and required re-intubation. At [place], tolerated PS and subsequently extubated on 8/2 however required intubation within 24 hours. Hospital course complicated by septic shock 2/2 MRSA pneumonia and volume overload.   Past Medical History  Anxiety COPD GERD CAD Hypertension Hyperlipidemia Rheumatoid arthritis History of systolic heart failure, however 08/2018 Echo showed LVEF 50-55%, left atrium dilated  Significant Hospital Events   8/1 admission to [facility] ICU 8/2 extubated 8/3 early AM agitated, placed on BIPAP, required re-intubation 8/4 started on steroids for COPD exacerbation 8/8 septic shock requiring pressor support 8/9 off pressors, respiratory cultures +s.aureus 8/10 persistent encephalopathy off sedation, CT head ordered 8/11 encephalopathy improved 8/12 difficulty weaning due to tachypnea, ETT exchanged due to dried blood clot burden 8/13 Bleeding from subclavian CVL.  Exchanged for PICC line.  Consults:  PCCM  Procedures:  8/1 ETT > 8/2, 8/3 >  8/1 L subclavian CVL > 8/13 8/13 Rt PICC  Significant Diagnostic Tests:  08/2018 Echo> LVEF 50-55%, left atrium dilated  CT head 8/11- mild age-related atrophy and chronic microvascular ischemic changes.  Old lacunar infarct.  Micro Data:  7/31 blood > NGTD 7/31 sars-cov-2 > positive 8/1 Resp CX > Normal flora 8/7 Resp CX > MRSA 8/7 BCX > NGTD 8/8 UCX > NGTD  Antimicrobials/COVID rx  Cefepime 7/31 > 8/4  Flagyl 7/31  Remdesivir 8/1 > 8/5  Actemra 8/1  Decadron/solumedrol 8/1 >8/8  Zosyn 8/8>8/9 Azithro 8/7>8/9    Cefepime 8/14 >  Vanc 7/31, 8/8>   Interim history/subjective:  Weaning on PSV.  No acute events.  Objective   Blood pressure (!) 92/51, pulse 86, temperature 99.4 F (37.4 C), temperature source Oral, resp. rate (!) 26, height 6' (1.829 m), weight 103 kg, SpO2 100 %. CVP:  [2 mmHg-14 mmHg] 5 mmHg  Vent Mode: PSV;CPAP FiO2 (%):  [30 %] 30 % Set Rate:  [26 bmp] 26 bmp Vt Set:  [460 mL] 460 mL PEEP:  [5 cmH20] 5 cmH20 Pressure Support:  [12 cmH20-14 cmH20] 14 cmH20 Plateau Pressure:  [16 cmH20-26 cmH20] 16 cmH20   Intake/Output Summary (Last 24 hours) at 04/28/2019 0849 Last data filed at 04/28/2019 0800 Gross per 24 hour  Intake 4364.15 ml  Output 4025 ml  Net 339.15 ml   Filed Weights   04/24/19 0500 04/25/19 0500 04/28/19 0459  Weight: 104.5 kg 103.2 kg 103 kg   Physical Exam: Gen:      No acute distress HEENT:  EOMI, sclera anicteric Neck:     No masses; no thyromegaly, ETT  Lungs:    Clear to auscultation bilaterally; normal respiratory effort CV:         Regular rate and rhythm; no murmurs Abd:      + bowel sounds; soft, non-tender; no palpable masses, no distension Ext:    No edema; adequate peripheral perfusion Skin:      Warm and dry; no rash Neuro: Somnolent, non responsive  Resolved Hospital Problem list   Septic shock 2/2 S. 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 Plan Pressure support weans as tolerated Continue bronchodilators, flutter valve   Acute encephalopathy Wean off sedation. PRN fentanyl, Dilaudid.   Hypernatremia AKI Free water Monitor creatinine.   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  Best practice:  Diet: tube feeding  Pain/Anxiety/Delirium protocol (if indicated): as above VAP protocol (if indicated): yes DVT prophylaxis: Lovenox  GI prophylaxis: Pantoprazole for stress ulcer prophylaxis Glucose control: SSI Mobility: bed rest Code Status: full Family Communication: Per TRH Disposition: remain in ICU  Critical care time: 35 mins   The patient is critically ill with multiple organ system failure and requires high complexity decision making for assessment and support, frequent evaluation and titration of therapies, advanced monitoring, review of radiographic studies and interpretation of complex data.   Marshell Garfinkel MD [facility] Pager [phone] If no answer call 336 [phone] 04/28/2019, 8:49 AM   .

## 2019-04-28 NOTE — Progress Notes (Signed)
Updated pt's daughter, Kester Stimpson. All questions answered.

## 2019-04-28 NOTE — Progress Notes (Signed)
[facility] Progress Note  Patient: Robert [name] XTA:[id number]   PCP: Redmond School, MD DOB: November 05, 1950   DOA: 04/11/2019   DOS: 04/28/2019   Date of Service: the patient was seen and examined on 04/28/2019  Brief hospital course: Pt. with PMH of COPD, coronary artery disease, diabetes, ischemic cardiomyopathy, rheumatoid arthritis; admitted on 04/11/2019, Presents to the hospital with complaints of progressive worsening shortness of breath. He was mildly febrile and had worsening shortness of breath so he called EMS. In the ER, he was noted to be substantially hypoxic, had mental status changes. He tested positive for COVID-19. He was intubated in the emergency room for persistent hypoxia. He has been transferred to [facility] for further management.  Currently further plan is continue to support breathing.  Significant events: 8/1 admission to [facility] ICU 8/2 extubated 8/3 early AM agitated, placed on BIPAP, required re-intubation 8/4 started on steroids for COPD exacerbation 8/8 septic shock requiring pressor support 8/9 off pressors, respiratory cultures +s.aureus 8/10 persistent encephalopathy off sedation, CT head ordered 8/11 encephalopathy improved 8/12 difficulty weaning due to tachypnea, ETT exchanged due to clot burden 8/13 PICC line inserted and subclavian central line removed 8/14 Antibiotics coverage broaden due to persistent fever and resp distress, pan cultured   COVID 19 specific treatment: Vitamin C and Zinc: started on 04/26/2019 DVT Prophylaxis: SCD, pharmacological prophylaxis contraindicated due to active bleeding issues last 55 huors resume 04/27/19  Remdesivir: completed 04/16/2019 Steroids: off since 04/19/2019 Actemra: received off-label Actemra on 04/12/2019  Subjective: T-max 100.4.  Blood pressure stable.  Tachypneic. Sugars in 200s. Urine output 3.6 L.  Net 1.2 L positive. Received 40 mg Lasix yesterday.  ET aspirate growing abundant  staph aureus also has gram-negative rod.  Still on Vanco and cefepime.  Assessment and Plan: 1. Acute COVID-19 Viral illness  Lab Results  Component Value Date   SARSCOV2NAA POSITIVE (A) 04/11/2019   SARSCOV2NAA Detected (A) 04/11/2019   CXR: hazy bilateral peripheral opacities  Recent Labs    04/26/19 0519 04/26/19 0520 04/27/19 0610 04/28/19 0440  DDIMER  --  2.94* 4.92* 4.56*  CRP 0.9  --  <0.8 <0.8    Tmax last 24 hours:  Temp (24hrs), Avg:99.8 F (37.7 C), Min:98.9 F (37.2 C), Max:100.4 F (38 C)  Oxygen requirements: Vent Mode: PRVC FiO2 (%):  [30 %] 30 % Set Rate:  [26 bmp] 26 bmp Vt Set:  [460 mL] 460 mL PEEP:  [5 cmH20] 5 cmH20 Pressure Support:  [12 cmH20] 12 cmH20 Plateau Pressure:  [20 cmH20-26 cmH20] 20 cmH20   Antibiotics:on vancomycin and cefepime again starting 04/25/2019, completed course for MRSA pneumonia Diuretics: PRN lasix, none 04/26/19, net negative in last 24 hours, error in documentation  Prone positioning: Patient encouraged to stay in prone position as much as possible.  This patient has confirmed COVID-19 in the setting of the ongoing 2020 coronavirus pandemic.  He has hypoxia and is high-risk for intubation, (due to age/BMI >35/CAD/CRP > 14 mg/dL, or troponin >=0.1 ng/dL) but expected to survive >48 hours and has good baseline functional status.  he is not known to be on immunomodulators, anti-rejection medications, or cancer chemotherapy, has no history of TB or latent TB, and no history of diverticulitis or intestinal perforation.  Platelets are >50K, ANC is >500, and ALT/AST are below 5x ULN with no known hepatitis B infection. The investigational nature of this medication was discussed with the patient/HCPOA and they choose to proceed as the potential benefits are felt  to outweigh risks at this time.  PPE During this encounter: Patient Isolation: Airborne + Droplet + Contact HCP PPE: CAPR, gown. gloves Patient PPE: None  The treatment  plan and use of medications and known side effects were discussed with patient/family. It was clearly explained that there is no proven definitive treatment for COVID-19 infection yet. Any medications used here are based on case reports/anecdotal data which are not peer-reviewed and has not been studied using randomized control trials.  Complete risks and long-term side effects are unknown, however in the best clinical judgment they seem to be of some clinical benefit rather than medical risks.  Patient/family agree with the treatment plan and want to receive these treatments as indicated.   2.  MRSA healthcare associated pneumonia. Septic shock.  POA Concern for active infection on 04/25/2019. Required pressors now off. Started on IV vancomycin and Zosyn and azithromycin from 04/19/2019. Sputum culture came back positive for MRSA.  Completed treatment for 7 days IV vancomycin Blood cultures so far negative.  Broadening the coverage due to persistent fevers 04/25/2019. Blood cultures so far negative.  ET aspirate Gram stain abundant staph aureus, FEW GRAM NEGATIVE RODS RARE GRAM POSITIVE RODS RARE YEAST  Continue for now.  We will decide on the duration once ET aspirate culture finalized. Procalcitonin level trending from 0.7> 0.2  3.  Paroxysmal A. fib with RVR. Currently in normal sinus rhythm. On metoprolol 25 mg twice daily. [place] Vasc score of 5.   Patient was on IV heparin.  Currently anticoagulation is on hold due to bleeding.  We will resume once we verify no active bleeding after resumption of aspirin and Plavix. Continue to monitor.  4.  Acute on chronic diastolic CHF. Essential hypertension. History of CAD Continue Lopressor. Continue PRN IV diuresis. PCI 2018.  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 Discussed with cardiology on 04/24/2019.  Given active bleeding from subclavian line as well as ET tube currently holding off on anticoagulation. Resume aspirin and if no further  bleeding add Plavix in 2 to 3 days. Resume aspirin today, and heparin s/c 04/27/19, plavix can be resumed on Monday  5.  Endotracheal tube clotting. Resolved  Bleeding from the subclavian line. Anemia clinically undetermined etiology  Patient was on therapeutic heparin. ET tube was clotted.  Exchange on 04/23/2019. Currently no anticoagulation. Continue SCDs H&H with slow down trend in last 2 days.  6.  Hypernatremia.  Hyperchloremia Current issue. Not responding to free water and D5 at 50.  Will increase D5 at 125.  Stop free water. Monitor. Recheck later.   7.  Acute kidney injury. Urinary retention.  Likely combination of diuresis as well as vancomycin induced injury. Baseline serum creatinine 0.80. Progressively worsened during the hospital course. Normalized again around 04/22/2019. Worsened to 2.39 with BUN of 81 on 04/24/2019. Retention was seen on 08/13, needed foley catheter.  Also monitoring vancomycin dose. there was 5.7 litre error in documentation on free water on 04/26/2019.  8.  Type 2 diabetes uncontrolled with hyperglycemia. With vascular disease and neuropathy at baseline. On gabapentin at home. Glipizide acarbose metformin 1000 mg twice daily at home. Currently hyperglycemic in the hospital. Tube feed coverage insulin 12 units every 4 hours. Lantus 45 units twice daily. Resistant insulin sliding scale every 4 hours.  9.  Rheumatoid arthritis. Patient is on Actemra and methotrexate at home. Currently on hold.  10. Pressure ulcer medial sacrum bilateral buttocks.  Identified on 04/17/2019  not POA per documentation. Continue frequent dressing changes.  Pressure Injury 04/17/19 Sacrum Medial Deep Tissue Injury - Purple or maroon localized area of discolored intact skin or blood-filled blister due to damage of underlying soft tissue from pressure and/or shear. dark purple with some skin sloughing on lowe (Active)  04/17/19 0730  Location: Sacrum    Location Orientation: Medial  Staging: Deep Tissue Injury - Purple or maroon localized area of discolored intact skin or blood-filled blister due to damage of underlying soft tissue from pressure and/or shear.  Wound Description (Comments): dark purple with some skin sloughing on lower sacral area  Present on Admission: No     Pressure Injury 04/17/19 Buttocks Right;Lower Unstageable - Full thickness tissue loss in which the base of the ulcer is covered by slough (yellow, tan, gray, green or brown) and/or eschar (tan, brown or black) in the wound bed. (Active)  04/17/19 0730  Location: Buttocks  Location Orientation: Right;Lower  Staging: Unstageable - Full thickness tissue loss in which the base of the ulcer is covered by slough (yellow, tan, gray, green or brown) and/or eschar (tan, brown or black) in the wound bed.  Wound Description (Comments):   Present on Admission: No     Pressure Injury 04/17/19 Buttocks Left;Lower Deep Tissue Injury - Purple or maroon localized area of discolored intact skin or blood-filled blister due to damage of underlying soft tissue from pressure and/or shear. (Active)  04/17/19 0730  Location: Buttocks  Location Orientation: Left;Lower  Staging: Deep Tissue Injury - Purple or maroon localized area of discolored intact skin or blood-filled blister due to damage of underlying soft tissue from pressure and/or shear.  Wound Description (Comments):   Present on Admission: No     LDA Central line: left subclavian removed on 04/24/2019, PICC line placed on 04/24/2019 Foley catheter: 04/24/2019 Nutrition: cortrack  Interventions: Tube feeding, Prostat  Diet: NPO tube feed DVT Prophylaxis: SCD, pharmacological prophylaxis contraindicated due to bleeding  Advance goals of care discussion: full code  Family Communication: No family was present at bedside, at the time of interview.  D/w daughter on 04/28/2019, Opportunity was given to ask question and all  questions were answered satisfactorily.   Disposition:  Discharge to be determined .  Consultants: PCCM  Procedures: noe  Scheduled Meds:  arformoterol  15 mcg Nebulization BID   aspirin  81 mg Per Tube Daily   budesonide (PULMICORT) nebulizer solution  0.5 mg Nebulization BID   chlorhexidine gluconate (MEDLINE KIT)  15 mL Mouth Rinse BID   Chlorhexidine Gluconate Cloth  6 each Topical Daily   feeding supplement (PRO-STAT SUGAR FREE 64)  30 mL Per Tube TID   folic acid  1 mg Per Tube Daily   free water  200 mL Per Tube Q8H   gabapentin  100 mg Per Tube Q8H   heparin injection (subcutaneous)  7,500 Units Subcutaneous Q8H   insulin aspart  0-20 Units Subcutaneous Q4H   insulin aspart  8 Units Subcutaneous Q4H   insulin glargine  45 Units Subcutaneous BID   ipratropium-albuterol  3 mL Nebulization TID   mouth rinse  15 mL Mouth Rinse 10 times per day   metoprolol tartrate  25 mg Per Tube TID   multivitamin  15 mL Per Tube Daily   pantoprazole sodium  40 mg Per Tube Daily   sodium chloride flush  10-40 mL Intracatheter Q12H   vitamin C  500 mg Per Tube Daily   zinc sulfate  220 mg Per Tube Daily   Continuous Infusions:  ceFEPime (MAXIPIME)  IV Stopped (04/28/19 0300)   dexmedetomidine (PRECEDEX) IV infusion 0.3 mcg/kg/hr (04/27/19 1800)   dextrose 75 mL/hr at 04/27/19 2243   feeding supplement (VITAL AF 1.2 CAL) 1,000 mL (04/28/19 1610)   vancomycin Stopped (04/27/19 1750)   PRN Meds: acetaminophen, dextrose, fentaNYL (SUBLIMAZE) injection, hydrALAZINE, HYDROmorphone, sodium chloride flush, Thrombi-Pad Antibiotics: Anti-infectives (From admission, onward)   Start     Dose/Rate Route Frequency Ordered Stop   04/27/19 1800  ceFEPIme (MAXIPIME) 2 g in sodium chloride 0.9 % 100 mL IVPB     2 g 200 mL/hr over 30 Minutes Intravenous Every 8 hours 04/27/19 1319     04/26/19 1630  vancomycin (VANCOCIN) 1,750 mg in sodium chloride 0.9 % 500 mL IVPB     1,750  mg 250 mL/hr over 120 Minutes Intravenous Every 24 hours 04/26/19 1623     04/25/19 1400  vancomycin (VANCOCIN) IVPB 1000 mg/200 mL premix     1,000 mg 200 mL/hr over 60 Minutes Intravenous  Once 04/25/19 1251 04/25/19 1456   04/25/19 1300  ceFEPIme (MAXIPIME) 2 g in sodium chloride 0.9 % 100 mL IVPB  Status:  Discontinued     2 g 200 mL/hr over 30 Minutes Intravenous Every 12 hours 04/25/19 1247 04/27/19 1319   04/23/19 1921  vancomycin variable dose per unstable renal function (pharmacist dosing)  Status:  Discontinued      Does not apply See admin instructions 04/23/19 1921 04/25/19 1252   04/22/19 2200  dextrose 5 % 250 mL with vancomycin (VANCOCIN) 1,250 mg infusion  Status:  Discontinued     166.7 mL/hr  Intravenous Every 12 hours 04/22/19 1324 04/23/19 1921   04/20/19 1400  vancomycin (VANCOCIN) 1,250 mg in sodium chloride 0.9 % 250 mL IVPB  Status:  Discontinued     1,250 mg 166.7 mL/hr over 90 Minutes Intravenous Every 24 hours 04/19/19 1352 04/20/19 1215   04/20/19 1400  vancomycin (VANCOCIN) 1,250 mg in sodium chloride 0.9 % 250 mL IVPB  Status:  Discontinued     1,250 mg 166.7 mL/hr over 90 Minutes Intravenous Every 24 hours 04/20/19 1215 04/20/19 1220   04/20/19 1400  Vancomycin HCl in Dextrose 1.25-5 GM/250ML-% SOLN 1,250 mg  Status:  Discontinued     1,250 mg 166.7 mL/hr  Intravenous Every 24 hours 04/20/19 1220 04/20/19 1221   04/20/19 1400  Vancomycin 1237m in D5W  Status:  Discontinued     166.7 mL/hr  Intravenous Continuous 04/20/19 1230 04/20/19 1233   04/20/19 1400  Vancomycin 12581min D5W  Status:  Discontinued     166.7 mL/hr  Intravenous Every 24 hours 04/20/19 1233 04/22/19 1324   04/19/19 2200  piperacillin-tazobactam (ZOSYN) IVPB 3.375 g  Status:  Discontinued     3.375 g 12.5 mL/hr over 240 Minutes Intravenous Every 8 hours 04/19/19 1344 04/21/19 1533   04/19/19 1430  vancomycin (VANCOCIN) 2,000 mg in sodium chloride 0.9 % 500 mL IVPB     2,000 mg 250 mL/hr  over 120 Minutes Intravenous  Once 04/19/19 1344 04/19/19 1806   04/19/19 1400  piperacillin-tazobactam (ZOSYN) IVPB 3.375 g     3.375 g 100 mL/hr over 30 Minutes Intravenous  Once 04/19/19 1344 04/19/19 1530   04/19/19 1000  azithromycin (ZITHROMAX) tablet 250 mg  Status:  Discontinued     250 mg Per Tube Daily 04/18/19 1246 04/20/19 1655   04/18/19 1300  azithromycin (ZITHROMAX) tablet 500 mg     500 mg Per Tube Daily 04/18/19 1246 04/18/19 1351  04/13/19 1000  remdesivir 100 mg in sodium chloride 0.9 % 250 mL IVPB     100 mg 500 mL/hr over 30 Minutes Intravenous Every 24 hours 04/12/19 0833 04/16/19 1103   04/12/19 1000  remdesivir 200 mg in sodium chloride 0.9 % 250 mL IVPB     200 mg 500 mL/hr over 30 Minutes Intravenous Once 04/12/19 0833 04/12/19 1317   04/12/19 0600  ceFEPIme (MAXIPIME) 2 g in sodium chloride 0.9 % 100 mL IVPB  Status:  Discontinued     2 g 200 mL/hr over 30 Minutes Intravenous Every 8 hours 04/11/19 2253 04/15/19 1452   04/12/19 0200  vancomycin (VANCOCIN) IVPB 750 mg/150 ml premix  Status:  Discontinued     750 mg 150 mL/hr over 60 Minutes Intravenous Every 12 hours 04/11/19 2253 04/12/19 1455   04/11/19 2130  ceFEPIme (MAXIPIME) 2 g in sodium chloride 0.9 % 100 mL IVPB     2 g 200 mL/hr over 30 Minutes Intravenous  Once 04/11/19 2125 04/11/19 2257   04/11/19 2130  metroNIDAZOLE (FLAGYL) IVPB 500 mg     500 mg 100 mL/hr over 60 Minutes Intravenous  Once 04/11/19 2125 04/11/19 2356   04/11/19 2130  vancomycin (VANCOCIN) IVPB 1000 mg/200 mL premix     1,000 mg 200 mL/hr over 60 Minutes Intravenous  Once 04/11/19 2125 04/12/19 0105       Objective: Physical Exam: Vitals:   04/28/19 0459 04/28/19 0500 04/28/19 0524 04/28/19 0600  BP:  (!) 93/56 (!) 92/49 (!) 145/65  Pulse:  78 81 86  Resp:  (!) 21 (!) 28 (!) 28  Temp:      TempSrc:      SpO2:  98% 98% 98%  Weight: 103 kg     Height:        Intake/Output Summary (Last 24 hours) at 04/28/2019  0737 Last data filed at 04/28/2019 0600 Gross per 24 hour  Intake 5056.63 ml  Output 3800 ml  Net 1256.63 ml   Filed Weights   04/24/19 0500 04/25/19 0500 04/28/19 0459  Weight: 104.5 kg 103.2 kg 103 kg   General: intubated and sedated Appear in moderate distress, affect flat in affect Eyes: PERRL, Conjunctiva normal ENT: Oral Mucosa Clear, dry  Neck: difficult to assess  JVD, no Abnormal Mass Or lumps Cardiovascular: S1 and S2 Present, no Murmur, peripheral pulses symmetrical Respiratory: normal respiratory effort, Bilateral Air entry equal and Decreased, no use of accessory muscle, bilateral  Crackles, no wheezes Abdomen: Bowel Sound present, Soft and no tenderness, no hernia Skin: no rashes  Extremities: no Pedal edema, no calf tenderness Neurologic: normal without focal findings, Gait not checked due to patient safety concerns  Data Reviewed: CBC: Recent Labs  Lab 04/24/19 0512 04/25/19 0535 04/26/19 0520 04/26/19 1355 04/27/19 0610 04/28/19 0440  WBC 9.5 6.8 6.4 6.7 7.0 7.0  NEUTROABS 7.9* 5.4 5.0  --  5.4 5.3  HGB 11.2* 10.2* 9.7* 9.5* 9.8* 9.7*  HCT 34.6* 32.5* 31.3* 30.5* 32.1* 32.1*  MCV 94.3 95.9 97.5 97.4 97.0 99.1  PLT 115* 110* 102* 92* 123* 889*   Basic Metabolic Panel: Recent Labs  Lab 04/24/19 0512 04/25/19 0535 04/26/19 0520 04/26/19 1355 04/27/19 0610 04/27/19 1440 04/27/19 2315 04/28/19 0440  NA 145 151* 153* 147* 153* 153* 152* 154*  K 4.4 4.1 4.4 4.2 4.4 4.7 4.6 4.7  CL 111 118* 122* 116* 121* 120* 120* 121*  CO2 24 25 26 25 27 26 25 27   GLUCOSE 310*  259* 239* 415* 253* 293* 371* 226*  BUN 81* 89* 86* 77* 75* 71* 70* 71*  CREATININE 2.39* 2.06* 1.70* 1.55* 1.41* 1.38* 1.33* 1.31*  CALCIUM 7.9* 8.0* 8.2* 7.9* 8.3* 8.2* 8.2* 8.3*  MG 3.1* 2.8* 2.7*  --  2.5*  --   --  2.4    Liver Function Tests: Recent Labs  Lab 04/24/19 0512 04/25/19 0535 04/26/19 0520 04/27/19 0610 04/28/19 0440  AST 32 31 59* 78* 60*  ALT 50* 54* 88* 122*  117*  ALKPHOS 75 58 63 68 72  BILITOT 0.5 0.4 0.3 0.4 0.4  PROT 5.0* 5.2* 5.1* 5.3* 5.5*  ALBUMIN 2.2* 2.2* 2.2* 2.1* 2.2*   No results for input(s): LIPASE, AMYLASE in the last 168 hours. No results for input(s): AMMONIA in the last 168 hours. Coagulation Profile: No results for input(s): INR, PROTIME in the last 168 hours. Cardiac Enzymes: No results for input(s): CKTOTAL, CKMB, CKMBINDEX, TROPONINI in the last 168 hours. BNP (last 3 results) No results for input(s): PROBNP in the last 8760 hours. CBG: Recent Labs  Lab 04/27/19 0827 04/27/19 1133 04/27/19 1929 04/27/19 2326 04/28/19 0352  GLUCAP 221* 244* 314* 342* 225*   Studies: No results found.   Time spent: 35 minutes  Author: Berle Mull, MD [facility] 04/28/2019 7:37 AM  To reach On-call, see care teams to locate the attending and reach out to them via [email or web address]. If 7PM-7AM, please contact night-coverage If you still have difficulty reaching the attending provider, please page the [facility] (Director on Call) for [facility] on amion for assistance.

## 2019-04-29 LAB — GLUCOSE, CAPILLARY
Glucose-Capillary: 159 mg/dL — ABNORMAL HIGH (ref 70–99)
Glucose-Capillary: 167 mg/dL — ABNORMAL HIGH (ref 70–99)
Glucose-Capillary: 167 mg/dL — ABNORMAL HIGH (ref 70–99)
Glucose-Capillary: 168 mg/dL — ABNORMAL HIGH (ref 70–99)
Glucose-Capillary: 171 mg/dL — ABNORMAL HIGH (ref 70–99)
Glucose-Capillary: 172 mg/dL — ABNORMAL HIGH (ref 70–99)

## 2019-04-29 LAB — COMPREHENSIVE METABOLIC PANEL
ALT: 115 U/L — ABNORMAL HIGH (ref 0–44)
AST: 60 U/L — ABNORMAL HIGH (ref 15–41)
Albumin: 2.3 g/dL — ABNORMAL LOW (ref 3.5–5.0)
Alkaline Phosphatase: 78 U/L (ref 38–126)
Anion gap: 5 (ref 5–15)
BUN: 64 mg/dL — ABNORMAL HIGH (ref 8–23)
CO2: 26 mmol/L (ref 22–32)
Calcium: 8.6 mg/dL — ABNORMAL LOW (ref 8.9–10.3)
Chloride: 120 mmol/L — ABNORMAL HIGH (ref 98–111)
Creatinine, Ser: 1.18 mg/dL (ref 0.61–1.24)
GFR calc Af Amer: >60 mL/min (ref >60)
GFR calc non Af Amer: >60 mL/min (ref >60)
Glucose, Bld: 183 mg/dL — ABNORMAL HIGH (ref 70–99)
Potassium: 4.8 mmol/L (ref 3.5–5.1)
Sodium: 151 mmol/L — ABNORMAL HIGH (ref 135–145)
Total Bilirubin: 0.3 mg/dL (ref 0.3–1.2)
Total Protein: 5.8 g/dL — ABNORMAL LOW (ref 6.5–8.1)

## 2019-04-29 LAB — CBC WITH DIFFERENTIAL/PLATELET
Abs Immature Granulocytes: 0.05 10*3/uL (ref 0.00–0.07)
Basophils Absolute: 0.1 10*3/uL (ref 0.0–0.1)
Basophils Relative: 1 %
Eosinophils Absolute: 0.3 10*3/uL (ref 0.0–0.5)
Eosinophils Relative: 3 %
HCT: 35.1 % — ABNORMAL LOW (ref 39.0–52.0)
Hemoglobin: 10.5 g/dL — ABNORMAL LOW (ref 13.0–17.0)
Immature Granulocytes: 1 %
Lymphocytes Relative: 8 %
Lymphs Abs: 0.8 10*3/uL (ref 0.7–4.0)
MCH: 30 pg (ref 26.0–34.0)
MCHC: 29.9 g/dL — ABNORMAL LOW (ref 30.0–36.0)
MCV: 100.3 fL — ABNORMAL HIGH (ref 80.0–100.0)
Monocytes Absolute: 0.5 10*3/uL (ref 0.1–1.0)
Monocytes Relative: 5 %
Neutro Abs: 8.5 10*3/uL — ABNORMAL HIGH (ref 1.7–7.7)
Neutrophils Relative %: 82 %
Platelets: 131 10*3/uL — ABNORMAL LOW (ref 150–400)
RBC: 3.5 MIL/uL — ABNORMAL LOW (ref 4.22–5.81)
RDW: 17.3 % — ABNORMAL HIGH (ref 11.5–15.5)
WBC: 10.2 10*3/uL (ref 4.0–10.5)
nRBC: 0 % (ref 0.0–0.2)

## 2019-04-29 LAB — CULTURE, RESPIRATORY W GRAM STAIN

## 2019-04-29 LAB — D-DIMER, QUANTITATIVE: D-Dimer, Quant: 3.3 ug/mL-FEU — ABNORMAL HIGH (ref 0.00–0.50)

## 2019-04-29 LAB — C-REACTIVE PROTEIN: CRP: <0.8 mg/dL (ref <1.0)

## 2019-04-29 LAB — MAGNESIUM: Magnesium: 2.3 mg/dL (ref 1.7–2.4)

## 2019-04-29 MED ORDER — IPRATROPIUM-ALBUTEROL 0.5-2.5 (3) MG/3ML IN SOLN: 3.0000 mL | Freq: Four times a day (QID) | RESPIRATORY_TRACT | Status: DC | PRN

## 2019-04-29 MED ORDER — VANCOMYCIN HCL 10 G IV SOLR
1500.0000 mg | INTRAVENOUS | Status: AC
  Administered 2019-04-30 – 2019-05-03 (×3): 1500 mg via INTRAVENOUS
  Filled 2019-04-29 (×3): qty 1500

## 2019-04-29 MED ORDER — VANCOMYCIN HCL 10 G IV SOLR
1500.0000 mg | Freq: Two times a day (BID) | INTRAVENOUS | Status: DC
  Administered 2019-04-29 (×2): 1500 mg via INTRAVENOUS
  Filled 2019-04-29 (×2): qty 1500

## 2019-04-29 MED ORDER — TAMSULOSIN HCL 0.4 MG PO CAPS
0.4000 mg | ORAL_CAPSULE | Freq: Every day | ORAL | Status: DC
  Administered 2019-04-29 – 2019-05-04 (×6): 0.4 mg via ORAL
  Filled 2019-04-29 (×7): qty 1

## 2019-04-29 NOTE — Progress Notes (Signed)
Daughter, [name], updated via telephone.

## 2019-04-29 NOTE — Progress Notes (Signed)
NAME:  Robert [name], MRN:  [id number], DOB:  06-26-1951, LOS: 70 ADMISSION DATE:  04/11/2019, CONSULTATION DATE:  8/1 REFERRING MD:  Roderic Palau, CHIEF COMPLAINT:  Dyspnea   Brief History   68 year old male admitted on 8/1 in the setting of ARDS from COVID-19 pneumonia. Initially presented to [facility] on 7/31 and required intubation. Post-procedure in the ED, patient awakened and self-extubated and required re-intubation. At [facility], tolerated PS and subsequently extubated on 8/2 however required intubation within 24 hours. Hospital course complicated by septic shock 2/2 MRSA pneumonia and volume overload.   Past Medical History  Anxiety COPD GERD CAD Hypertension Hyperlipidemia Rheumatoid arthritis History of systolic heart failure, however 08/2018 Echo showed LVEF 50-55%, left atrium dilated  Significant Hospital Events   8/1 admission to [facility] ICU 8/2 extubated 8/3 early AM agitated, placed on BIPAP, required re-intubation 8/4 started on steroids for COPD exacerbation 8/8 septic shock requiring pressor support 8/9 off pressors, respiratory cultures +s.aureus 8/10 persistent encephalopathy off sedation, CT head ordered 8/11 encephalopathy improved 8/12 difficulty weaning due to tachypnea, ETT exchanged due to dried blood clot burden 8/13 Bleeding from subclavian CVL.  Exchanged for PICC line  Consults:  PCCM  Procedures:  8/1 ETT >8/2, 8/3 >  8/1 L subclavian CVL >8/13 8/13 Rt PICC  Significant Diagnostic Tests:  08/2018 Echo> LVEF 50-55%, left atrium dilated  CT head 8/11- mild age-related atrophy and chronic microvascular ischemic changes.  Old lacunar infarct.  Micro Data:  7/31 blood >NGTD 7/31 sars-cov-2 > positive 8/1 Resp CX > Normal flora 8/7 Resp CX > MRSA 8/7 BCX > NGTD 8/8 UCX > NGTD 8/15 Resp culture>  MRSA   Antimicrobials:  Cefepime 7/31 > 8/4  Flagyl 7/31  Remdesivir 8/1 > 8/5  Actemra 8/1  Decadron/solumedrol 8/1 >8/8  Zosyn 8/8>8/9 Azithro  8/7>8/9   Cefepime 8/14 >  Vanc 7/31, 8/8>    Interim history/subjective:  Weaning some on PSV 14/5 Awake, follows commands  Objective   Blood pressure 130/64, pulse (!) 101, temperature 99.1 F (37.3 C), temperature source Oral, resp. rate (!) 29, height 6' (1.829 m), weight 105 kg, SpO2 98 %. CVP:  [5 mmHg-17 mmHg] 16 mmHg  Vent Mode: PRVC FiO2 (%):  [30 %] 30 % Set Rate:  [26 bmp] 26 bmp Vt Set:  [460 mL] 460 mL PEEP:  [5 cmH20] 5 cmH20 Pressure Support:  [14 cmH20] 14 cmH20 Plateau Pressure:  [16 cmH20-23 cmH20] 23 cmH20   Intake/Output Summary (Last 24 hours) at 04/29/2019 0813 Last data filed at 04/29/2019 0800 Gross per 24 hour  Intake 4413.83 ml  Output 2740 ml  Net 1673.83 ml   Filed Weights   04/28/19 0459 04/29/19 0000 04/29/19 0350  Weight: 103 kg 105 kg 105 kg    Examination: General:  In bed on vent HENT: NCAT ETT in place PULM: CTA B, vent supported breathing CV: RRR, no mgr GI: BS+, soft, nontender MSK: normal bulk and tone Neuro: sedated on vent  8/14 CXR> ETT in place, lungs fairly clear compared to admission   Resolved Hospital Problem list     Assessment & Plan:  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: improving Continue to wean on pressure support during the daytime Ventilator assisted pneumonia prevention protocol Resume full support at night  MRSA pneumonia Continue vancomycin  COPD exacerbation: resolved Continue brovana/pulmicort  Need for sedation/acute encephalopathy Wean off sedation RASS target 0 to -1  Best practice:  Diet: Tube feeding Pain/Anxiety/Delirium protocol (if indicated):  As above VAP protocol (if indicated): Yes DVT prophylaxis: Lovenox GI prophylaxis: Pantoprazole for stress ulcer prophylaxis Glucose control: SSI Mobility: bed rest Code Status: full Family Communication: per [facility] Disposition: remain in ICU  Labs   CBC: Recent Labs  Lab 04/25/19 0535 04/26/19 0520 04/26/19 1355 04/27/19 0610 04/28/19  0440 04/29/19 0500  WBC 6.8 6.4 6.7 7.0 7.0 10.2  NEUTROABS 5.4 5.0  --  5.4 5.3 8.5*  HGB 10.2* 9.7* 9.5* 9.8* 9.7* 10.5*  HCT 32.5* 31.3* 30.5* 32.1* 32.1* 35.1*  MCV 95.9 97.5 97.4 97.0 99.1 100.3*  PLT 110* 102* 92* 123* 126* 131*    Basic Metabolic Panel: Recent Labs  Lab 04/25/19 0535 04/26/19 0520  04/27/19 0610 04/27/19 1440 04/27/19 2315 04/28/19 0440 04/28/19 1636 04/29/19 0500  NA 151* 153*   < > 153* 153* 152* 154* 142 151*  K 4.1 4.4   < > 4.4 4.7 4.6 4.7 3.8 4.8  CL 118* 122*   < > 121* 120* 120* 121* 116* 120*  CO2 25 26   < > 27 26 25 27  21* 26  GLUCOSE 259* 239*   < > 253* 293* 371* 226* 480* 183*  BUN 89* 86*   < > 75* 71* 70* 71* 57* 64*  CREATININE 2.06* 1.70*   < > 1.41* 1.38* 1.33* 1.31* 1.09 1.18  CALCIUM 8.0* 8.2*   < > 8.3* 8.2* 8.2* 8.3* 6.9* 8.6*  MG 2.8* 2.7*  --  2.5*  --   --  2.4  --  2.3   < > = values in this interval not displayed.   GFR: Estimated Creatinine Clearance: 77.2 mL/min (by C-G formula based on SCr of 1.18 mg/dL). Recent Labs  Lab 04/25/19 0542 04/26/19 0520 04/26/19 1355 04/27/19 0610 04/28/19 0440 04/29/19 0500  PROCALCITON 0.70 0.50  --  0.26  --   --   WBC  --  6.4 6.7 7.0 7.0 10.2    Liver Function Tests: Recent Labs  Lab 04/25/19 0535 04/26/19 0520 04/27/19 0610 04/28/19 0440 04/29/19 0500  AST 31 59* 78* 60* 60*  ALT 54* 88* 122* 117* 115*  ALKPHOS 58 63 68 72 78  BILITOT 0.4 0.3 0.4 0.4 0.3  PROT 5.2* 5.1* 5.3* 5.5* 5.8*  ALBUMIN 2.2* 2.2* 2.1* 2.2* 2.3*   No results for input(s): LIPASE, AMYLASE in the last 168 hours. No results for input(s): AMMONIA in the last 168 hours.  ABG    Component Value Date/Time   PHART 7.412 04/24/2019 0424   PCO2ART 36.9 04/24/2019 0424   PO2ART 84.0 04/24/2019 0424   HCO3 23.2 04/24/2019 0424   TCO2 24 04/24/2019 0424   ACIDBASEDEF 1.0 04/24/2019 0424   O2SAT 95.0 04/24/2019 0424     Coagulation Profile: No results for input(s): INR, PROTIME in the last  168 hours.  Cardiac Enzymes: No results for input(s): CKTOTAL, CKMB, CKMBINDEX, TROPONINI in the last 168 hours.  HbA1C: Hgb A1c MFr Bld  Date/Time Value Ref Range Status  04/11/2019 09:50 PM 7.4 (H) 4.8 - 5.6 % Final    Comment:    (NOTE) Pre diabetes:          5.7%-6.4% Diabetes:              >6.4% Glycemic control for   <7.0% adults with diabetes   06/07/2017 12:02 AM 9.1 (H) 4.8 - 5.6 % Final    Comment:    (NOTE) Pre diabetes:          5.7%-6.4%  Diabetes:              >6.4% Glycemic control for   <7.0% adults with diabetes     CBG: Recent Labs  Lab 04/28/19 1140 04/28/19 1549 04/28/19 1920 04/28/19 2350 04/29/19 0327  GLUCAP 227* 241* 179* 159* 167*     Critical care time: 35 minutes      Roselie Awkward, MD [place] [facility] Pager: [phone] Cell: [phone] If no response, call [phone]

## 2019-04-29 NOTE — Progress Notes (Signed)
[facility] Progress Note  Patient: Robert [name] PQZ:[id number]   PCP: Redmond School, MD DOB: 1951-06-11   DOA: 04/11/2019   DOS: 04/29/2019   Date of Service: the patient was seen and examined on 04/29/2019  Brief hospital course: Pt. with PMH of COPD, coronary artery disease, diabetes, ischemic cardiomyopathy, rheumatoid arthritis; admitted on 04/11/2019, Presents to the hospital with complaints of progressive worsening shortness of breath. He was mildly febrile and had worsening shortness of breath so he called EMS. In the ER, he was noted to be substantially hypoxic, had mental status changes. He tested positive for COVID-19. He was intubated in the emergency room for persistent hypoxia. He has been transferred to [facility] for further management.  Currently further plan is continue to support breathing.  Significant events: 8/1 admission to [facility] ICU 8/2 extubated 8/3 early AM agitated, placed on BIPAP, required re-intubation 8/4 started on steroids for COPD exacerbation 8/8 septic shock requiring pressor support 8/9 off pressors, respiratory cultures +s.aureus 8/10 persistent encephalopathy off sedation, CT head ordered 8/11 encephalopathy improved 8/12 difficulty weaning due to tachypnea, ETT exchanged due to clot burden 8/13 PICC line inserted and subclavian central line removed 8/14 Antibiotics coverage broaden due to persistent fever and resp distress, pan cultured   COVID 19 specific treatment: Vitamin C and Zinc: started on 04/26/2019 DVT Prophylaxis: SCD, pharmacological prophylaxis contraindicated due to active bleeding issues last 49 huors resume 04/27/19  Remdesivir: completed 04/16/2019 Steroids: off since 04/19/2019 Actemra: received off-label Actemra on 04/12/2019  Subjective: No further fevers.  Still tachypneic.  Blood pressure stable.  Heart rate stable.  30% FiO2.  On Precedex and cefepime.  Urine output 2.5 L.  Net 1.5 L positive.  ET  aspirate.  Moderate thick tan color.  Sugars under control.  Sodium 151.  Chloride 120.  LFTs about the same.  Platelets better.  Still on Vanco and cefepime.  Assessment and Plan: 1. Acute COVID-19 Viral illness  Lab Results  Component Value Date   SARSCOV2NAA POSITIVE (A) 04/11/2019   SARSCOV2NAA Detected (A) 04/11/2019   CXR: hazy bilateral peripheral opacities  Recent Labs    04/27/19 0610 04/28/19 0440 04/29/19 0500  DDIMER 4.92* 4.56* 3.30*  CRP <0.8 <0.8 <0.8    Tmax last 24 hours:  Temp (24hrs), Avg:98.8 F (37.1 C), Min:98.2 F (36.8 C), Max:99.4 F (37.4 C)  Oxygen requirements: Vent Mode: PRVC FiO2 (%):  [30 %] 30 % Set Rate:  [26 bmp] 26 bmp Vt Set:  [460 mL] 460 mL PEEP:  [5 cmH20] 5 cmH20 Pressure Support:  [14 cmH20] 14 cmH20 Plateau Pressure:  [16 cmH20-23 cmH20] 23 cmH20   Antibiotics:on vancomycin and cefepime again starting 04/25/2019, completed course for MRSA pneumonia Diuretics: PRN lasix, none 04/26/19, net negative in last 24 hours, error in documentation  Prone positioning: Patient encouraged to stay in prone position as much as possible.  This patient has confirmed COVID-19 in the setting of the ongoing 2020 coronavirus pandemic.  He has hypoxia and is high-risk for intubation, (due to age/BMI >35/CAD/CRP > 14 mg/dL, or troponin >=0.1 ng/dL) but expected to survive >48 hours and has good baseline functional status.  he is not known to be on immunomodulators, anti-rejection medications, or cancer chemotherapy, has no history of TB or latent TB, and no history of diverticulitis or intestinal perforation.  Platelets are >50K, ANC is >500, and ALT/AST are below 5x ULN with no known hepatitis B infection. The investigational nature of this medication was discussed  with the patient/HCPOA and they choose to proceed as the potential benefits are felt to outweigh risks at this time.  PPE During this encounter: Patient Isolation: Airborne + Droplet +  Contact HCP PPE: CAPR, gown. gloves Patient PPE: None  The treatment plan and use of medications and known side effects were discussed with patient/family. It was clearly explained that there is no proven definitive treatment for COVID-19 infection yet. Any medications used here are based on case reports/anecdotal data which are not peer-reviewed and has not been studied using randomized control trials.  Complete risks and long-term side effects are unknown, however in the best clinical judgment they seem to be of some clinical benefit rather than medical risks.  Patient/family agree with the treatment plan and want to receive these treatments as indicated.   2. MRSA healthcare associated pneumonia. Septic shock.  POA Concern for active infection on 04/25/2019. Required pressors now off. Started on IV vancomycin and Zosyn and azithromycin from 04/19/2019. Sputum culture came back positive for MRSA.  Completed treatment for 7 days IV vancomycin Blood cultures so far negative.  Broaden the coverage due to persistent fevers 04/25/2019. Blood cultures so far negative.  ET aspirate Gram stain abundant staph aureus, FEW GRAM NEGATIVE RODS RARE GRAM POSITIVE RODS RARE YEAST, final culture only shows MRSA.  Continue vanco for now. Cefepime completed for 5 days. Procalcitonin level trending from 0.7> 0.2  3. Paroxysmal A. fib with RVR. Currently in normal sinus rhythm. On metoprolol 25 mg twice daily. [place] Vasc score of 5.   Patient was on IV heparin.  Currently anticoagulation is on hold due to bleeding.  We will resume once we verify no active bleeding after resumption of aspirin and Plavix. Continue to monitor.  4. Acute on chronic diastolic CHF. Essential hypertension. History of CAD Continue Lopressor. Continue PRN IV diuresis. PCI 2018.  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 Discussed with cardiology on 04/24/2019.  Given active bleeding from subclavian line as well as ET tube  currently holding off on anticoagulation. Resume aspirin and DVT Px. Plavix was started, but stopped due to concern that the pt may need tracheostomy.   5.  Endotracheal tube clotting. Resolved  Bleeding from the subclavian line. Anemia clinically undetermined etiology  Patient was on therapeutic heparin. ET tube was clotted.  Exchange on 04/23/2019. Currently no anticoagulation. H&H with slow down trend in last 2 days.  6.  Hypernatremia.  Hyperchloremia Current issue. free water and D5 at 50.   7.  Acute kidney injury. Urinary retention.  Likely combination of diuresis as well as vancomycin induced injury. Baseline serum creatinine 0.80. Progressively worsened during the hospital course. Normalized again around 04/22/2019. Worsened to 2.39 with BUN of 81 on 04/24/2019. Retention was seen on 08/13, needed foley catheter.  Also monitoring vancomycin dose. there was 5.7 litre error in documentation on free water on 04/26/2019.  8.  Type 2 diabetes uncontrolled with hyperglycemia. With vascular disease and neuropathy at baseline. On gabapentin at home. Glipizide acarbose metformin 1000 mg twice daily at home. Currently hyperglycemic in the hospital. Tube feed coverage insulin 12 units every 4 hours. Lantus 45 units twice daily. Resistant insulin sliding scale every 4 hours.  9.  Rheumatoid arthritis. Patient is on Actemra and methotrexate at home. Currently on hold.  10. Pressure ulcer medial sacrum bilateral buttocks.  Identified on 04/17/2019 not POA per documentation. Continue frequent dressing changes.  Pressure Injury 04/17/19 Sacrum Medial Deep Tissue Injury - Purple or maroon localized area of  discolored intact skin or blood-filled blister due to damage of underlying soft tissue from pressure and/or shear. dark purple with some skin sloughing on lowe (Active)  04/17/19 0730  Location: Sacrum  Location Orientation: Medial  Staging: Deep Tissue Injury - Purple or  maroon localized area of discolored intact skin or blood-filled blister due to damage of underlying soft tissue from pressure and/or shear.  Wound Description (Comments): dark purple with some skin sloughing on lower sacral area  Present on Admission: No     Pressure Injury 04/17/19 Buttocks Right;Lower Unstageable - Full thickness tissue loss in which the base of the ulcer is covered by slough (yellow, tan, gray, green or brown) and/or eschar (tan, brown or black) in the wound bed. (Active)  04/17/19 0730  Location: Buttocks  Location Orientation: Right;Lower  Staging: Unstageable - Full thickness tissue loss in which the base of the ulcer is covered by slough (yellow, tan, gray, green or brown) and/or eschar (tan, brown or black) in the wound bed.  Wound Description (Comments):   Present on Admission: No     Pressure Injury 04/17/19 Buttocks Left;Lower Deep Tissue Injury - Purple or maroon localized area of discolored intact skin or blood-filled blister due to damage of underlying soft tissue from pressure and/or shear. (Active)  04/17/19 0730  Location: Buttocks  Location Orientation: Left;Lower  Staging: Deep Tissue Injury - Purple or maroon localized area of discolored intact skin or blood-filled blister due to damage of underlying soft tissue from pressure and/or shear.  Wound Description (Comments):   Present on Admission: No    LDA Central line: left subclavian removed on 04/24/2019, PICC line placed on 04/24/2019 Foley catheter: 04/24/2019 Nutrition: cortrack  Interventions: Tube feeding, Prostat  Diet: NPO tube feed DVT Prophylaxis: Heparin   Advance goals of care discussion: full code  Family Communication: No family was present at bedside, at the time of interview. D/w daughter on 04/29/2019, Opportunity was given to ask question and all questions were answered satisfactorily.  Disposition:  Discharge to be determined.  Consultants: PCCM  Procedures: see  above  Scheduled Meds:  arformoterol  15 mcg Nebulization BID   aspirin  81 mg Per Tube Daily   budesonide (PULMICORT) nebulizer solution  0.5 mg Nebulization BID   chlorhexidine gluconate (MEDLINE KIT)  15 mL Mouth Rinse BID   Chlorhexidine Gluconate Cloth  6 each Topical Daily   clopidogrel  75 mg Per Tube Daily   feeding supplement (PRO-STAT SUGAR FREE 64)  30 mL Per Tube TID   folic acid  1 mg Per Tube Daily   gabapentin  100 mg Per Tube Q8H   heparin injection (subcutaneous)  7,500 Units Subcutaneous Q8H   insulin aspart  0-20 Units Subcutaneous Q4H   insulin aspart  12 Units Subcutaneous Q4H   insulin glargine  45 Units Subcutaneous BID   ipratropium-albuterol  3 mL Nebulization TID   mouth rinse  15 mL Mouth Rinse 10 times per day   metoprolol tartrate  25 mg Per Tube TID   multivitamin  15 mL Per Tube Daily   pantoprazole sodium  40 mg Per Tube Daily   sodium chloride flush  10-40 mL Intracatheter Q12H   vitamin C  500 mg Per Tube Daily   zinc sulfate  220 mg Per Tube Daily   Continuous Infusions:  ceFEPime (MAXIPIME) IV 2 g (04/29/19 0107)   dexmedetomidine (PRECEDEX) IV infusion 0.2 mcg/kg/hr (04/29/19 0655)   dextrose 50 mL/hr at 04/29/19 0112  feeding supplement (VITAL AF 1.2 CAL) 1,000 mL (04/28/19 2311)   vancomycin 250 mL/hr at 04/28/19 1800   PRN Meds: acetaminophen, dextrose, fentaNYL (SUBLIMAZE) injection, hydrALAZINE, HYDROmorphone, sodium chloride flush, Thrombi-Pad Antibiotics: Anti-infectives (From admission, onward)   Start     Dose/Rate Route Frequency Ordered Stop   04/27/19 1800  ceFEPIme (MAXIPIME) 2 g in sodium chloride 0.9 % 100 mL IVPB     2 g 200 mL/hr over 30 Minutes Intravenous Every 8 hours 04/27/19 1319     04/26/19 1630  vancomycin (VANCOCIN) 1,750 mg in sodium chloride 0.9 % 500 mL IVPB     1,750 mg 250 mL/hr over 120 Minutes Intravenous Every 24 hours 04/26/19 1623     04/25/19 1400  vancomycin (VANCOCIN) IVPB  1000 mg/200 mL premix     1,000 mg 200 mL/hr over 60 Minutes Intravenous  Once 04/25/19 1251 04/25/19 1456   04/25/19 1300  ceFEPIme (MAXIPIME) 2 g in sodium chloride 0.9 % 100 mL IVPB  Status:  Discontinued     2 g 200 mL/hr over 30 Minutes Intravenous Every 12 hours 04/25/19 1247 04/27/19 1319   04/23/19 1921  vancomycin variable dose per unstable renal function (pharmacist dosing)  Status:  Discontinued      Does not apply See admin instructions 04/23/19 1921 04/25/19 1252   04/22/19 2200  dextrose 5 % 250 mL with vancomycin (VANCOCIN) 1,250 mg infusion  Status:  Discontinued     166.7 mL/hr  Intravenous Every 12 hours 04/22/19 1324 04/23/19 1921   04/20/19 1400  vancomycin (VANCOCIN) 1,250 mg in sodium chloride 0.9 % 250 mL IVPB  Status:  Discontinued     1,250 mg 166.7 mL/hr over 90 Minutes Intravenous Every 24 hours 04/19/19 1352 04/20/19 1215   04/20/19 1400  vancomycin (VANCOCIN) 1,250 mg in sodium chloride 0.9 % 250 mL IVPB  Status:  Discontinued     1,250 mg 166.7 mL/hr over 90 Minutes Intravenous Every 24 hours 04/20/19 1215 04/20/19 1220   04/20/19 1400  Vancomycin HCl in Dextrose 1.25-5 GM/250ML-% SOLN 1,250 mg  Status:  Discontinued     1,250 mg 166.7 mL/hr  Intravenous Every 24 hours 04/20/19 1220 04/20/19 1221   04/20/19 1400  Vancomycin 1220m in D5W  Status:  Discontinued     166.7 mL/hr  Intravenous Continuous 04/20/19 1230 04/20/19 1233   04/20/19 1400  Vancomycin 12572min D5W  Status:  Discontinued     166.7 mL/hr  Intravenous Every 24 hours 04/20/19 1233 04/22/19 1324   04/19/19 2200  piperacillin-tazobactam (ZOSYN) IVPB 3.375 g  Status:  Discontinued     3.375 g 12.5 mL/hr over 240 Minutes Intravenous Every 8 hours 04/19/19 1344 04/21/19 1533   04/19/19 1430  vancomycin (VANCOCIN) 2,000 mg in sodium chloride 0.9 % 500 mL IVPB     2,000 mg 250 mL/hr over 120 Minutes Intravenous  Once 04/19/19 1344 04/19/19 1806   04/19/19 1400  piperacillin-tazobactam (ZOSYN) IVPB  3.375 g     3.375 g 100 mL/hr over 30 Minutes Intravenous  Once 04/19/19 1344 04/19/19 1530   04/19/19 1000  azithromycin (ZITHROMAX) tablet 250 mg  Status:  Discontinued     250 mg Per Tube Daily 04/18/19 1246 04/20/19 1655   04/18/19 1300  azithromycin (ZITHROMAX) tablet 500 mg     500 mg Per Tube Daily 04/18/19 1246 04/18/19 1351   04/13/19 1000  remdesivir 100 mg in sodium chloride 0.9 % 250 mL IVPB     100 mg 500  mL/hr over 30 Minutes Intravenous Every 24 hours 04/12/19 0833 04/16/19 1103   04/12/19 1000  remdesivir 200 mg in sodium chloride 0.9 % 250 mL IVPB     200 mg 500 mL/hr over 30 Minutes Intravenous Once 04/12/19 0833 04/12/19 1317   04/12/19 0600  ceFEPIme (MAXIPIME) 2 g in sodium chloride 0.9 % 100 mL IVPB  Status:  Discontinued     2 g 200 mL/hr over 30 Minutes Intravenous Every 8 hours 04/11/19 2253 04/15/19 1452   04/12/19 0200  vancomycin (VANCOCIN) IVPB 750 mg/150 ml premix  Status:  Discontinued     750 mg 150 mL/hr over 60 Minutes Intravenous Every 12 hours 04/11/19 2253 04/12/19 1455   04/11/19 2130  ceFEPIme (MAXIPIME) 2 g in sodium chloride 0.9 % 100 mL IVPB     2 g 200 mL/hr over 30 Minutes Intravenous  Once 04/11/19 2125 04/11/19 2257   04/11/19 2130  metroNIDAZOLE (FLAGYL) IVPB 500 mg     500 mg 100 mL/hr over 60 Minutes Intravenous  Once 04/11/19 2125 04/11/19 2356   04/11/19 2130  vancomycin (VANCOCIN) IVPB 1000 mg/200 mL premix     1,000 mg 200 mL/hr over 60 Minutes Intravenous  Once 04/11/19 2125 04/12/19 0105       Objective: Physical Exam: Vitals:   04/29/19 0450 04/29/19 0500 04/29/19 0514 04/29/19 0600  BP: 127/72 127/72  (!) 141/76  Pulse: 97 99  (!) 101  Resp: (!) 26 (!) 27  (!) 27  Temp:      TempSrc:      SpO2: 97% 98% 97% 96%  Weight:      Height:        Intake/Output Summary (Last 24 hours) at 04/29/2019 0738 Last data filed at 04/29/2019 0600 Gross per 24 hour  Intake 4245.74 ml  Output 2815 ml  Net 1430.74 ml   Filed  Weights   04/28/19 0459 04/29/19 0000 04/29/19 0350  Weight: 103 kg 105 kg 105 kg   General: intubated and sedated Appear in moderate distress, affect flat in affect Eyes: PERRL, Conjunctiva normal ENT: Oral Mucosa Clear, dry  Neck: difficult to assess  JVD, no Abnormal Mass Or lumps Cardiovascular: S1 and S2 Present, no Murmur, peripheral pulses symmetrical Respiratory: normal respiratory effort, Bilateral Air entry equal and Decreased, no use of accessory muscle, bilateral  Crackles, no wheezes Abdomen: Bowel Sound present, Soft and no tenderness, no hernia Skin: no rashes  Extremities: no Pedal edema, no calf tenderness Neurologic: normal without focal findings, Gait not checked due to patient safety concerns  Data Reviewed: CBC: Recent Labs  Lab 04/25/19 0535 04/26/19 0520 04/26/19 1355 04/27/19 0610 04/28/19 0440 04/29/19 0500  WBC 6.8 6.4 6.7 7.0 7.0 10.2  NEUTROABS 5.4 5.0  --  5.4 5.3 8.5*  HGB 10.2* 9.7* 9.5* 9.8* 9.7* 10.5*  HCT 32.5* 31.3* 30.5* 32.1* 32.1* 35.1*  MCV 95.9 97.5 97.4 97.0 99.1 100.3*  PLT 110* 102* 92* 123* 126* 830*   Basic Metabolic Panel: Recent Labs  Lab 04/25/19 0535 04/26/19 0520  04/27/19 0610 04/27/19 1440 04/27/19 2315 04/28/19 0440 04/28/19 1636 04/29/19 0500  NA 151* 153*   < > 153* 153* 152* 154* 142 151*  K 4.1 4.4   < > 4.4 4.7 4.6 4.7 3.8 4.8  CL 118* 122*   < > 121* 120* 120* 121* 116* 120*  CO2 25 26   < > 27 26 25 27  21* 26  GLUCOSE 259* 239*   < > 253*  293* 371* 226* 480* 183*  BUN 89* 86*   < > 75* 71* 70* 71* 57* 64*  CREATININE 2.06* 1.70*   < > 1.41* 1.38* 1.33* 1.31* 1.09 1.18  CALCIUM 8.0* 8.2*   < > 8.3* 8.2* 8.2* 8.3* 6.9* 8.6*  MG 2.8* 2.7*  --  2.5*  --   --  2.4  --  2.3   < > = values in this interval not displayed.    Liver Function Tests: Recent Labs  Lab 04/25/19 0535 04/26/19 0520 04/27/19 0610 04/28/19 0440 04/29/19 0500  AST 31 59* 78* 60* 60*  ALT 54* 88* 122* 117* 115*  ALKPHOS 58 63 68  72 78  BILITOT 0.4 0.3 0.4 0.4 0.3  PROT 5.2* 5.1* 5.3* 5.5* 5.8*  ALBUMIN 2.2* 2.2* 2.1* 2.2* 2.3*   No results for input(s): LIPASE, AMYLASE in the last 168 hours. No results for input(s): AMMONIA in the last 168 hours. Coagulation Profile: No results for input(s): INR, PROTIME in the last 168 hours. Cardiac Enzymes: No results for input(s): CKTOTAL, CKMB, CKMBINDEX, TROPONINI in the last 168 hours. BNP (last 3 results) No results for input(s): PROBNP in the last 8760 hours. CBG: Recent Labs  Lab 04/28/19 1140 04/28/19 1549 04/28/19 1920 04/28/19 2350 04/29/19 0327  GLUCAP 227* 241* 179* 159* 167*   Studies: No results found.   Time spent: 35 minutes  Author: Berle Mull, MD [facility] 04/29/2019 7:38 AM  To reach On-call, see care teams to locate the attending and reach out to them via [email or web address]. If 7PM-7AM, please contact night-coverage If you still have difficulty reaching the attending provider, please page the [facility] (Director on Call) for [facility] on amion for assistance.

## 2019-04-29 NOTE — Progress Notes (Addendum)
Pharmacy Antibiotic Note  UNNAMED Robert [name] is a 68 y.o. male with PMH of COPD, CAD, DM, ischemic cardiomyopathy, RA who was admitted on 04/11/2019 with COVID-19 pneumonia. Pt developed an MRSA pneumonia being treated with vancomycin. Due to persistent fevers, gram negative coverage will be added with cefepime. Pharmacy has been consulted for cefepime and to continue vancomycin (beyond day 7 for MRSA pneumonia) dosing for sepsis.   WBC remains wnl. Afebrile. Repeat TA on on 8/15 has grown out abundant MRSA. SCr has improved to 1.18 today   Plan:  Change vancomycin to 1500 mg IV Q 24 hours   Goal AUC = 400 - 550.  Est AUC 472 using SCr 1.18  Continue cefepime 2g q8h. LOT?  Monitor clinical picture, renal function, WBCs, and cultures.  Height: 6' (182.9 cm) Weight: 231 lb 7.7 oz (105 kg) IBW/kg (Calculated) : 77.6  Temp (24hrs), Avg:98.7 F (37.1 C), Min:98.2 F (36.8 C), Max:99.1 F (37.3 C)  Recent Labs  Lab 04/23/19 2100  04/25/19 0535 04/26/19 0520 04/26/19 1355 04/27/19 0610 04/27/19 1440 04/27/19 2315 04/28/19 0440 04/28/19 1636 04/29/19 0500  WBC  --    < > 6.8 6.4 6.7 7.0  --   --  7.0  --  10.2  CREATININE  --    < > 2.06* 1.70* 1.55* 1.41* 1.38* 1.33* 1.31* 1.09 1.18  VANCOTROUGH 36*  --   --   --   --   --   --   --   --   --   --   VANCORANDOM  --   --  18  --   --   --   --   --   --   --   --    < > = values in this interval not displayed.    Estimated Creatinine Clearance: 77.2 mL/min (by C-G formula based on SCr of 1.18 mg/dL).    Allergies  Allergen Reactions  . Ivp Dye [Iodinated Diagnostic Agents] Itching and Rash   Antimicrobials this admission: 7/31 vancomycin >> 8/1, resumed 8/8 >>  7/31 cefepime >> 8/5, resumed 8/14>> 7/31 metronidazole x1 8/1 Remdesivir >> 8/5 8/1 Actemra (PTA tocilizumab 162mg  SQ weekly on Mondays for RA)  8/7 Azithro >> 8/9 8/8 Zosyn >> 8/10  Dose adjustments this admission: 8/11 empiric renal adjustment. 8/12 VT -  36; Hold vanc  8/14 VR -37; Vanc x1  Microbiology results: 7/31 [facility] x2: NGF 7/31 UCx: NGF 7/31 SARS CoV-2: positive 8/1 Ta: rare normal flora 8/1 MRSA PCR: neg 8/7 [id number]  8/7 BCx: NGF 8/8 UCx: NGF 8/14 Bcx: ngtd  8/14 UCx: NGF 8/15 TA:  abundant MRSA    Thank you for allowing pharmacy to be a part of this patient's care.  Albertina Parr, PharmD., BCPS Clinical Pharmacist Clinical phone for 04/29/19 until 5pm: [phone]

## 2019-04-30 ENCOUNTER — Inpatient Hospital Stay (HOSPITAL_COMMUNITY): Payer: Medicare HMO

## 2019-04-30 DIAGNOSIS — I2583 Coronary atherosclerosis due to lipid rich plaque: Secondary | ICD-10-CM

## 2019-04-30 LAB — CULTURE, BLOOD (ROUTINE X 2)
Culture: NO GROWTH
Culture: NO GROWTH
Special Requests: ADEQUATE

## 2019-04-30 LAB — CBC WITH DIFFERENTIAL/PLATELET
Abs Immature Granulocytes: 0.06 10*3/uL (ref 0.00–0.07)
Basophils Absolute: 0 10*3/uL (ref 0.0–0.1)
Basophils Relative: 0 %
Eosinophils Absolute: 0.2 10*3/uL (ref 0.0–0.5)
Eosinophils Relative: 2 %
HCT: 33 % — ABNORMAL LOW (ref 39.0–52.0)
Hemoglobin: 9.9 g/dL — ABNORMAL LOW (ref 13.0–17.0)
Immature Granulocytes: 1 %
Lymphocytes Relative: 6 %
Lymphs Abs: 0.6 10*3/uL — ABNORMAL LOW (ref 0.7–4.0)
MCH: 30.2 pg (ref 26.0–34.0)
MCHC: 30 g/dL (ref 30.0–36.0)
MCV: 100.6 fL — ABNORMAL HIGH (ref 80.0–100.0)
Monocytes Absolute: 0.4 10*3/uL (ref 0.1–1.0)
Monocytes Relative: 5 %
Neutro Abs: 7.7 10*3/uL (ref 1.7–7.7)
Neutrophils Relative %: 86 %
Platelets: 108 10*3/uL — ABNORMAL LOW (ref 150–400)
RBC: 3.28 MIL/uL — ABNORMAL LOW (ref 4.22–5.81)
RDW: 17.4 % — ABNORMAL HIGH (ref 11.5–15.5)
WBC: 9 10*3/uL (ref 4.0–10.5)
nRBC: 0 % (ref 0.0–0.2)

## 2019-04-30 LAB — BASIC METABOLIC PANEL
Anion gap: 5 (ref 5–15)
BUN: 60 mg/dL — ABNORMAL HIGH (ref 8–23)
CO2: 23 mmol/L (ref 22–32)
Calcium: 8.3 mg/dL — ABNORMAL LOW (ref 8.9–10.3)
Chloride: 123 mmol/L — ABNORMAL HIGH (ref 98–111)
Creatinine, Ser: 1.16 mg/dL (ref 0.61–1.24)
GFR calc Af Amer: >60 mL/min (ref >60)
GFR calc non Af Amer: >60 mL/min (ref >60)
Glucose, Bld: 199 mg/dL — ABNORMAL HIGH (ref 70–99)
Potassium: 4.6 mmol/L (ref 3.5–5.1)
Sodium: 151 mmol/L — ABNORMAL HIGH (ref 135–145)

## 2019-04-30 LAB — COMPREHENSIVE METABOLIC PANEL
ALT: 96 U/L — ABNORMAL HIGH (ref 0–44)
AST: 45 U/L — ABNORMAL HIGH (ref 15–41)
Albumin: 2.2 g/dL — ABNORMAL LOW (ref 3.5–5.0)
Alkaline Phosphatase: 79 U/L (ref 38–126)
Anion gap: 3 — ABNORMAL LOW (ref 5–15)
BUN: 61 mg/dL — ABNORMAL HIGH (ref 8–23)
CO2: 26 mmol/L (ref 22–32)
Calcium: 8.6 mg/dL — ABNORMAL LOW (ref 8.9–10.3)
Chloride: 121 mmol/L — ABNORMAL HIGH (ref 98–111)
Creatinine, Ser: 1.25 mg/dL — ABNORMAL HIGH (ref 0.61–1.24)
GFR calc Af Amer: >60 mL/min (ref >60)
GFR calc non Af Amer: 60 mL/min — ABNORMAL LOW (ref >60)
Glucose, Bld: 228 mg/dL — ABNORMAL HIGH (ref 70–99)
Potassium: 5.2 mmol/L — ABNORMAL HIGH (ref 3.5–5.1)
Sodium: 150 mmol/L — ABNORMAL HIGH (ref 135–145)
Total Bilirubin: 0.5 mg/dL (ref 0.3–1.2)
Total Protein: 5.7 g/dL — ABNORMAL LOW (ref 6.5–8.1)

## 2019-04-30 LAB — MAGNESIUM: Magnesium: 2.2 mg/dL (ref 1.7–2.4)

## 2019-04-30 LAB — GLUCOSE, CAPILLARY
Glucose-Capillary: 173 mg/dL — ABNORMAL HIGH (ref 70–99)
Glucose-Capillary: 185 mg/dL — ABNORMAL HIGH (ref 70–99)
Glucose-Capillary: 185 mg/dL — ABNORMAL HIGH (ref 70–99)
Glucose-Capillary: 187 mg/dL — ABNORMAL HIGH (ref 70–99)
Glucose-Capillary: 188 mg/dL — ABNORMAL HIGH (ref 70–99)
Glucose-Capillary: 193 mg/dL — ABNORMAL HIGH (ref 70–99)
Glucose-Capillary: 223 mg/dL — ABNORMAL HIGH (ref 70–99)

## 2019-04-30 LAB — TROPONIN I (HIGH SENSITIVITY)
Troponin I (High Sensitivity): 42 ng/L — ABNORMAL HIGH (ref <18)
Troponin I (High Sensitivity): 41 ng/L — ABNORMAL HIGH (ref <18)

## 2019-04-30 LAB — D-DIMER, QUANTITATIVE: D-Dimer, Quant: 2.62 ug/mL-FEU — ABNORMAL HIGH (ref 0.00–0.50)

## 2019-04-30 LAB — C-REACTIVE PROTEIN: CRP: 1.1 mg/dL — ABNORMAL HIGH (ref <1.0)

## 2019-04-30 MED ORDER — SODIUM POLYSTYRENE SULFONATE 15 GM/60ML PO SUSP
30.0000 g | Freq: Once | ORAL | Status: AC
  Administered 2019-04-30: 30 g
  Filled 2019-04-30: qty 120

## 2019-04-30 MED ORDER — TRAZODONE HCL 50 MG PO TABS
50.0000 mg | ORAL_TABLET | Freq: Every day | ORAL | Status: DC
  Administered 2019-04-30 – 2019-05-03 (×4): 50 mg via ORAL
  Filled 2019-04-30 (×4): qty 1

## 2019-04-30 MED ORDER — FREE WATER
200.0000 mL | Status: DC
  Administered 2019-04-30 – 2019-05-05 (×29): 200 mL

## 2019-04-30 NOTE — Progress Notes (Signed)
eLink Physician-Brief Progress Note Patient Name: Robert [name] DOB: 1951-03-19 MRN: [id number]   Date of Service  04/30/2019  HPI/Events of Note  Multiple issues: 1. Episode of 27 beat run of NSVT - Troponin #1 = 42. EKG reveals Sinus tachycardia. Borderline repolarization abnormality. Already on ASA and Metoprolol PO.  2. K+ = 5.2.   eICU Interventions  Will order: 1. Continue to trend Troponin. 2. Kayexalate 30 gm per tube now.  3. Repeat BMP at 12 noon.      Intervention Category Major Interventions: Arrhythmia - evaluation and management  Jayd Forrey Eugene 04/30/2019, 5:39 AM

## 2019-04-30 NOTE — Progress Notes (Signed)
PROGRESS NOTE  Robert [name] NAT:[id number] DOB: February 27, 1951 DOA: 04/11/2019 PCP: Redmond School, MD   LOS: 18 days   Brief Narrative / Interim history: 68 year old male with history of COPD, CAD, diabetes mellitus, ischemic cardiomyopathy, RA, presented to the hospital and was admitted on 04/11/2019 with shortness of breath, he was found to be significantly hypoxic, tested positive for COVID-19, was intubated and transferred to [facility] for further management.  Significant events: 8/1 admission to [facility] ICU 8/2 extubated 8/3 early AM agitated, placed on BIPAP, required re-intubation 8/4 started on steroids for COPD exacerbation 8/8 septic shock requiring pressor support 8/9 off pressors, respiratory cultures +s.aureus 8/10 persistent encephalopathy off sedation, CT head ordered 8/11 encephalopathy improved 8/12 difficulty weaning due to tachypnea, ETT exchanged due to clot burden 8/13 PICC line inserted and subclavian central line removed 8/14 Antibiotics coverage broaden due to persistent fever and resp distress, pan cultured  8/19 more alert, tolerating pressure support  Subjective: Alert, tracks with his eyes, interactive, nods appropriately to questions  Assessment & Plan: Principal Problem:   Pneumonia due to COVID-19 virus Active Problems:   Rheumatoid arthritis ([place])   Hyperlipidemia   COPD (chronic obstructive pulmonary disease) ([place])   Controlled type 2 diabetes mellitus with diabetic peripheral angiopathy without gangrene, with long-term current use of insulin (HCC)   Essential hypertension   Ischemic cardiomyopathy   Coronary artery disease   Dyslipidemia (high LDL; low HDL)   COVID-19 virus detected   Acute respiratory distress syndrome (ARDS) due to COVID-19 virus   Atrial fibrillation with RVR (HCC)   COPD (chronic obstructive pulmonary disease) with emphysema (HCC)   AKI (acute kidney injury) ([place])   Diabetes mellitus type 2, uncontrolled, with complications (HCC)    Pressure injury of skin   Staphylococcal pneumonia ([place])   HCAP (healthcare-associated pneumonia)   Principal Problem Acute Hypoxic Respiratory Failure due to Covid-19 Viral Illness, COPD exacerbation -Remains on pressure support this morning, attempt weaning as tolerated  Vent Mode: PSV;CPAP FiO2 (%):  [30 %] 30 % Set Rate:  [26 bmp] 26 bmp Vt Set:  [460 mL] 460 mL PEEP:  [5 cmH20] 5 cmH20 Pressure Support:  [12 cmH20-14 cmH20] 12 cmH20 Plateau Pressure:  [18 cmH20-24 cmH20] 18 cmH20   Fever: afebrile  Temp (24hrs), Avg:99.1 F (37.3 C), Min:98.7 F (37.1 C), Max:99.7 F (37.6 C)  Antibiotics:  Cefepime 7/31 >8/4  Flagyl 7/31  Remdesivir 8/1 >8/5  Actemra 8/1  Decadron/solumedrol 8/1 >8/8  Zosyn 8/8>8/9 Azithro 8/7>8/9 Cefepime 8/14 >8/19 Vanc 7/31, 8/8>  Remdesivir: 8/1 >> 8/5 Steroids: decadron / solumedrol 8/1 >> 8/5 Diuretics: 8/1 >> 8/5 Actemra: 8/1 Convalescent Plasma: none  Vitamin C and Zinc: yes   COVID-19 Labs  Recent Labs    04/28/19 0440 04/29/19 0500 04/30/19 0350  DDIMER 4.56* 3.30* 2.62*  CRP <0.8 <0.8 1.1*    Lab Results  Component Value Date   SARSCOV2NAA POSITIVE (A) 04/11/2019   SARSCOV2NAA Detected (A) 04/11/2019    Active Problems MRSA HCAP /septic shock -Off pressors now -Currently on vancomycin, afebrile, plan to end vancomycin on 8/22 which would give him a total of 3 weeks  Paroxysmal A. fib with RVR -Currently in sinus, on metoprolol 25 3 times daily -He was on IV heparin but no anticoagulation is on hold due to bleeding with ETT clotting as well as bleeding from the subclavian line -Currently on heparin 7500 units 3 times daily  Acute on chronic diastolic CHF, essential hypertension, history of CAD -History of  PCI in 2018 with in-stent restenosis, currently on aspirin and DVT prophylaxis and his Plavix is on hold as there are concerns that the patient may need tracheostomy -Initially diuresed now on hold due to  hypernatremia -Volume status stable  Hypernatremia -Increased free water per tube today, core track kinked will need to be replaced   Acute kidney injury -Creatinine peaked as high as 2.39 on 04/24/2019, possibly multifactorial in the setting of diuresis and use of vancomycin -He was also found to have retention on 8/13 requiring Foley catheter placement -Now on tamsulosin  ETT clotting/bleeding from the subclavian line -Patient was on therapeutic heparin, ET tube was clotted and exchange on 04/23/2019.  Currently on heparin prophylaxis -Hemoglobin 9.9 this morning, continue to closely monitor  Thrombocytopenia -Chronic, for a number of years, intermittent, overall stable  Type 2 diabetes mellitus, uncontrolled with hyperglycemia with vascular disease and neuropathy at baseline -Continue Lantus, tube feed coverage, sliding scale.  Continue gabapentin.  Currently on resistant sliding scale every 4, plus NovoLog 12 units every 4 along with Lantus 45 units twice daily.  He is on dextrose infusion as well for his hyper natremia  RA -He is on Actemra and methotrexate at home, currently on hold  Pressure ulcer of medial sacrum bilateral buttocks Pressure Injury 04/17/19 Sacrum Medial Deep Tissue Injury - Purple or maroon localized area of discolored intact skin or blood-filled blister due to damage of underlying soft tissue from pressure and/or shear. dark purple with some skin sloughing on lowe (Active)  04/17/19 0730  Location: Sacrum  Location Orientation: Medial  Staging: Deep Tissue Injury - Purple or maroon localized area of discolored intact skin or blood-filled blister due to damage of underlying soft tissue from pressure and/or shear.  Wound Description (Comments): dark purple with some skin sloughing on lower sacral area  Present on Admission: No     Pressure Injury 04/17/19 Buttocks Right;Lower Unstageable - Full thickness tissue loss in which the base of the ulcer is covered by  slough (yellow, tan, gray, green or brown) and/or eschar (tan, brown or black) in the wound bed. (Active)  04/17/19 0730  Location: Buttocks  Location Orientation: Right;Lower  Staging: Unstageable - Full thickness tissue loss in which the base of the ulcer is covered by slough (yellow, tan, gray, green or brown) and/or eschar (tan, brown or black) in the wound bed.  Wound Description (Comments):   Present on Admission: No     Pressure Injury 04/17/19 Buttocks Left;Lower Deep Tissue Injury - Purple or maroon localized area of discolored intact skin or blood-filled blister due to damage of underlying soft tissue from pressure and/or shear. (Active)  04/17/19 0730  Location: Buttocks  Location Orientation: Left;Lower  Staging: Deep Tissue Injury - Purple or maroon localized area of discolored intact skin or blood-filled blister due to damage of underlying soft tissue from pressure and/or shear.  Wound Description (Comments):   Present on Admission: No        Scheduled Meds: . arformoterol  15 mcg Nebulization BID  . aspirin  81 mg Per Tube Daily  . budesonide (PULMICORT) nebulizer solution  0.5 mg Nebulization BID  . chlorhexidine gluconate (MEDLINE KIT)  15 mL Mouth Rinse BID  . Chlorhexidine Gluconate Cloth  6 each Topical Daily  . feeding supplement (PRO-STAT SUGAR FREE 64)  30 mL Per Tube TID  . folic acid  1 mg Per Tube Daily  . gabapentin  100 mg Per Tube Q8H  . heparin injection (subcutaneous)  7,500 Units Subcutaneous Q8H  . insulin aspart  0-20 Units Subcutaneous Q4H  . insulin aspart  12 Units Subcutaneous Q4H  . insulin glargine  45 Units Subcutaneous BID  . mouth rinse  15 mL Mouth Rinse 10 times per day  . metoprolol tartrate  25 mg Per Tube TID  . multivitamin  15 mL Per Tube Daily  . pantoprazole sodium  40 mg Per Tube Daily  . sodium chloride flush  10-40 mL Intracatheter Q12H  . tamsulosin  0.4 mg Oral QPC supper  . vitamin C  500 mg Per Tube Daily  . zinc  sulfate  220 mg Per Tube Daily   Continuous Infusions: . dextrose 75 mL/hr at 04/30/19 1015  . feeding supplement (VITAL AF 1.2 CAL) 70 mL/hr at 04/29/19 1800  . vancomycin     PRN Meds:.acetaminophen, dextrose, fentaNYL (SUBLIMAZE) injection, hydrALAZINE, HYDROmorphone, ipratropium-albuterol, sodium chloride flush, Thrombi-Pad   DVT prophylaxis: heparin Code Status: Full code Family Communication:  Disposition Plan: remain in ICU   Consultants:   PCCM  Procedures:   None   Micro data: 7/31 blood >NGTD 7/31 sars-cov-2 >positive 8/1 Resp CX > Normal flora 8/7 Resp CX >MRSA 8/7 BCX > NGTD 8/8 UCX > NGTD 28-Apr-2023 Resp culture>  MRSA    Objective: Vitals:   04/30/19 0800 04/30/19 0825 04/30/19 0900 04/30/19 0926  BP: (!) 124/59 (!) 124/59 135/66   Pulse: 93 94 98   Resp: (!) 21 (!) 27 (!) 28   Temp: 98.8 F (37.1 C)     TempSrc: Oral     SpO2: 96% 98% 100% 99%  Weight:      Height:        Intake/Output Summary (Last 24 hours) at 04/30/2019 1025 Last data filed at 04/30/2019 0805 Gross per 24 hour  Intake 3880.09 ml  Output 2175 ml  Net 1705.09 ml   Filed Weights   04/29/19 0000 04/29/19 0350 04/30/19 0400  Weight: 105 kg 105 kg 104.2 kg    Examination:  Constitutional: NAD Eyes: PERRL, lids and conjunctivae normal ENMT: Mucous membranes are moist. No oropharyngeal exudates Neck: normal, supple, no masses, no thyromegaly Respiratory: clear to auscultation bilaterally, no wheezing, no crackles. Normal respiratory effort. No accessory muscle use.  Cardiovascular: Regular rate and rhythm, no murmurs / rubs / gallops. No LE edema. 2+ pedal pulses. No carotid bruits.  Abdomen: no tenderness. Bowel sounds positive.  Musculoskeletal: no clubbing / cyanosis. No joint deformity upper and lower extremities. No contractures. Normal muscle tone.  Skin: no rashes, lesions, ulcers. No induration Neurologic: CN 2-12 grossly intact. Strength 5/5 in all 4.  Psychiatric:  Normal judgment and insight. Alert and oriented x 3. Normal mood.    Data Reviewed: I have independently reviewed following labs and imaging studies    CBC: Recent Labs  Lab 2019-04-28 0520 04-28-19 1355 04/27/19 0610 04/28/19 0440 04/29/19 0500 04/30/19 0350  WBC 6.4 6.7 7.0 7.0 10.2 9.0  NEUTROABS 5.0  --  5.4 5.3 8.5* 7.7  HGB 9.7* 9.5* 9.8* 9.7* 10.5* 9.9*  HCT 31.3* 30.5* 32.1* 32.1* 35.1* 33.0*  MCV 97.5 97.4 97.0 99.1 100.3* 100.6*  PLT 102* 92* 123* 126* 131* 010*   Basic Metabolic Panel: Recent Labs  Lab April 28, 2019 0520  04/27/19 0610  04/27/19 2315 04/28/19 0440 04/28/19 1636 04/29/19 0500 04/30/19 0350  NA 153*   < > 153*   < > 152* 154* 142 151* 150*  K 4.4   < > 4.4   < >  4.6 4.7 3.8 4.8 5.2*  CL 122*   < > 121*   < > 120* 121* 116* 120* 121*  CO2 26   < > 27   < > 25 27 21* 26 26  GLUCOSE 239*   < > 253*   < > 371* 226* 480* 183* 228*  BUN 86*   < > 75*   < > 70* 71* 57* 64* 61*  CREATININE 1.70*   < > 1.41*   < > 1.33* 1.31* 1.09 1.18 1.25*  CALCIUM 8.2*   < > 8.3*   < > 8.2* 8.3* 6.9* 8.6* 8.6*  MG 2.7*  --  2.5*  --   --  2.4  --  2.3 2.2   < > = values in this interval not displayed.   GFR: Estimated Creatinine Clearance: 72.5 mL/min (A) (by C-G formula based on SCr of 1.25 mg/dL (H)). Liver Function Tests: Recent Labs  Lab 04/26/19 0520 04/27/19 0610 04/28/19 0440 04/29/19 0500 04/30/19 0350  AST 59* 78* 60* 60* 45*  ALT 88* 122* 117* 115* 96*  ALKPHOS 63 68 72 78 79  BILITOT 0.3 0.4 0.4 0.3 0.5  PROT 5.1* 5.3* 5.5* 5.8* 5.7*  ALBUMIN 2.2* 2.1* 2.2* 2.3* 2.2*   No results for input(s): LIPASE, AMYLASE in the last 168 hours. No results for input(s): AMMONIA in the last 168 hours. Coagulation Profile: No results for input(s): INR, PROTIME in the last 168 hours. Cardiac Enzymes: No results for input(s): CKTOTAL, CKMB, CKMBINDEX, TROPONINI in the last 168 hours. BNP (last 3 results) No results for input(s): PROBNP in the last 8760 hours.  HbA1C: No results for input(s): HGBA1C in the last 72 hours. CBG: Recent Labs  Lab 04/29/19 1521 04/29/19 2047 04/29/19 2350 04/30/19 0408 04/30/19 0819  GLUCAP 172* 171* 185* 223* 185*   Lipid Profile: No results for input(s): CHOL, HDL, LDLCALC, TRIG, CHOLHDL, LDLDIRECT in the last 72 hours. Thyroid Function Tests: No results for input(s): TSH, T4TOTAL, FREET4, T3FREE, THYROIDAB in the last 72 hours. Anemia Panel: No results for input(s): VITAMINB12, FOLATE, FERRITIN, TIBC, IRON, RETICCTPCT in the last 72 hours. Urine analysis:    Component Value Date/Time   COLORURINE YELLOW 04/25/2019 0826   APPEARANCEUR HAZY (A) 04/25/2019 0826   LABSPEC 1.015 04/25/2019 0826   PHURINE 5.0 04/25/2019 0826   GLUCOSEU 150 (A) 04/25/2019 0826   HGBUR LARGE (A) 04/25/2019 0826   BILIRUBINUR NEGATIVE 04/25/2019 0826   KETONESUR NEGATIVE 04/25/2019 0826   PROTEINUR 30 (A) 04/25/2019 0826   UROBILINOGEN 0.2 12/22/2011 2130   NITRITE NEGATIVE 04/25/2019 0826   LEUKOCYTESUR NEGATIVE 04/25/2019 0826   Sepsis Labs: Invalid input(s): PROCALCITONIN, LACTICIDVEN  Recent Results (from the past 240 hour(s))  Culture, blood (routine x 2)     Status: None   Collection Time: 04/25/19  9:40 AM   Specimen: BLOOD LEFT HAND  Result Value Ref Range Status   Specimen Description   Final    BLOOD LEFT HAND Performed at [facility] [address] [place], [place] [address]    Special Requests   Final    BOTTLES DRAWN AEROBIC ONLY Blood Culture results may not be optimal due to an inadequate volume of blood received in culture bottles Performed at [facility] [address] [place], [place] [address]    Culture   Final    NO GROWTH 5 DAYS Performed at [facility], [place] [address] [place], [place] [address]  Report Status 04/30/2019 FINAL  Final  Culture, blood (routine x 2)     Status: None   Collection Time: 04/25/19  9:45 AM   Specimen: BLOOD  RIGHT HAND  Result Value Ref Range Status   Specimen Description   Final    BLOOD RIGHT HAND Performed at [facility] [address] [place], [place] [address]    Special Requests   Final    BOTTLES DRAWN AEROBIC ONLY Blood Culture adequate volume Performed at [facility] [address], [place], [place] [address]    Culture   Final    NO GROWTH 5 DAYS Performed at [facility], [place] [address], [place], [place] [address]    Report Status 04/30/2019 FINAL  Final  Culture, respiratory (non-expectorated)     Status: None   Collection Time: 04/26/19  8:46 AM   Specimen: Tracheal Aspirate; Respiratory  Result Value Ref Range Status   Specimen Description   Final    TRACHEAL ASPIRATE Performed at [facility] [address] [place], [place] [address]    Special Requests   Final    NONE Performed at [facility], [place] [address] [place], [place] [address]    Gram Stain   Final    FEW WBC PRESENT, PREDOMINANTLY PMN FEW GRAM POSITIVE COCCI IN CLUSTERS FEW GRAM NEGATIVE RODS RARE GRAM POSITIVE RODS RARE YEAST Performed at [facility], [place] [address] [place], [place] [address]    Culture   Final    ABUNDANT METHICILLIN RESISTANT STAPHYLOCOCCUS AUREUS   Report Status 04/29/2019 FINAL  Final   Organism ID, Bacteria METHICILLIN RESISTANT STAPHYLOCOCCUS AUREUS  Final      Susceptibility   Methicillin resistant staphylococcus aureus - MIC*    CIPROFLOXACIN <=0.5 SENSITIVE Sensitive     ERYTHROMYCIN >=8 RESISTANT Resistant     GENTAMICIN <=0.5 SENSITIVE Sensitive     OXACILLIN >=4 RESISTANT Resistant     TETRACYCLINE <=1 SENSITIVE Sensitive     VANCOMYCIN 1 SENSITIVE Sensitive     TRIMETH/SULFA <=10 SENSITIVE Sensitive     CLINDAMYCIN <=0.25 SENSITIVE Sensitive     RIFAMPIN <=0.5 SENSITIVE Sensitive     Inducible Clindamycin NEGATIVE Sensitive     * ABUNDANT METHICILLIN RESISTANT  STAPHYLOCOCCUS AUREUS      Radiology Studies: Dg Abd 1 View  Result Date: 04/30/2019 CLINICAL DATA:  NG tube placement EXAM: ABDOMEN - 1 VIEW COMPARISON:  None. FINDINGS: NG tube and feeding tube are in place, with the tip are both in the distal stomach. IMPRESSION: NG tube and feeding tube tip in the distal stomach. Electronically Signed   By: Rolm Baptise M.D.   On: 04/30/2019 08:22   Marzetta Board, MD, PhD [facility]  Contact via  [email or web address]  [place] P: [phone] F: [phone]

## 2019-04-30 NOTE — Progress Notes (Signed)
Nutrition Follow-up  DOCUMENTATION CODES:   Obesity unspecified  INTERVENTION:   Continue TF via Cortrak:   Vital AF 1.2 at 70 ml/h  Pro-stat 30 ml TID  Provides 2316 kcal, 171 gm protein, 1362 ml free water daily  NUTRITION DIAGNOSIS:   Inadequate oral intake related to inability to eat as evidenced by NPO status.  Ongoing   GOAL:   Provide needs based on ASPEN/SCCM guidelines  Met with TF  MONITOR:   Vent status, TF tolerance, Labs, Weight trends  ASSESSMENT:   68 yo male admitted with ARDS from COVID-19 pneumonia. PMH includes HLD, anxiety, ischemic cardiomyopathy, CAD, COPD, RA, HTN, DM-on insulin.  Patient remains intubated on ventilator support for ARDS, improving.  MV: 10.2 L/min Temp (24hrs), Avg:99.1 F (37.3 C), Min:98.7 F (37.1 C), Max:99.7 F (37.6 C)   Cortrak found to be clogged this morning, Cortrak team was able to dislodge a large particle found in the tube and tube feeding has been resumed.   TF order: Vital AF 1.2 at 70 ml/h with Pro-stat 30 ml TID to provide 2316 kcal, 171 gm protein, 1362 ml free water daily. Free water flushes increased to 200 ml every 4 hours.  Labs reviewed. Sodium 150 (H), potassium 5.2 (H) CBG's: 223-185  Medications reviewed and include folic acid, novolog, lantus, MVI, flomax, vitamin C, zinc sulfate.  Weight stable.  Diet Order:   Diet Order    None      EDUCATION NEEDS:   No education needs have been identified at this time  Skin:  Skin Assessment: Skin Integrity Issues: Skin Integrity Issues:: DTI, Unstageable DTI: sacrum, buttocks Unstageable: buttocks  Last BM:  8/19, type 7, rectal tube  Height:   Ht Readings from Last 1 Encounters:  04/12/19 6' (1.829 m)    Weight:   Wt Readings from Last 1 Encounters:  04/30/19 104.2 kg    Ideal Body Weight:  80.9 kg  BMI:  Body mass index is 31.16 kg/m.  Estimated Nutritional Needs:   Kcal:  2175  Protein:  150-175 gm  Fluid:  2  L    Molli Barrows, RD, LDN, [place] Pager [phone] After Hours Pager [phone]

## 2019-04-30 NOTE — Progress Notes (Signed)
Spoke with patients children by phone and updated them. They have several questions and concerns regarding possibility of placing a trach and whether this would be long term. Advised them those were questions for the doctor. No other questions at the time.

## 2019-04-30 NOTE — Progress Notes (Signed)
Patient has not slept for the last two nights. Advised patient that he really needs to sleep well in an effort to wean in the mornings. May need to consider something to help him sleep?

## 2019-04-30 NOTE — Progress Notes (Signed)
NAME:  Robert [name], MRN:  [id number], DOB:  May 29, 1951, LOS: 66 ADMISSION DATE:  04/11/2019, CONSULTATION DATE:  8/1 REFERRING MD:  Roderic Palau, CHIEF COMPLAINT:  Dyspnea   Brief History   68 year old male admitted on 8/1 in the setting of ARDS from COVID-19 pneumonia. Initially presented to [facility] on 7/31 and required intubation. Post-procedure in the ED, patient awakened and self-extubated and required re-intubation. At [facility], tolerated PS and subsequently extubated on 8/2 however required intubation within 24 hours. Hospital course complicated by septic shock 2/2 MRSA pneumonia and volume overload.   Past Medical History  Anxiety COPD GERD CAD Hypertension Hyperlipidemia Rheumatoid arthritis History of systolic heart failure, however 08/2018 Echo showed LVEF 50-55%, left atrium dilated  Significant Hospital Events   8/1 admission to [facility] ICU 8/2 extubated 8/3 early AM agitated, placed on BIPAP, required re-intubation 8/4 started on steroids for COPD exacerbation 8/8 septic shock requiring pressor support 8/9 off pressors, respiratory cultures +s.aureus 8/10 persistent encephalopathy off sedation, CT head ordered 8/11 encephalopathy improved 8/12 difficulty weaning due to tachypnea, ETT exchanged due to dried blood clot burden 8/13 Bleeding from subclavian CVL.  Exchanged for PICC line 8/17 started weaning on PSV 14/5  Consults:  PCCM  Procedures:  8/1 ETT >8/2, 8/3 >  8/1 L subclavian CVL >8/13 8/13 Rt PICC  Significant Diagnostic Tests:  08/2018 Echo> LVEF 50-55%, left atrium dilated  CT head 8/11- mild age-related atrophy and chronic microvascular ischemic changes.  Old lacunar infarct.  Micro Data:  7/31 blood >NGTD 7/31 sars-cov-2 > positive 8/1 Resp CX > Normal flora 8/7 Resp CX > MRSA 8/7 BCX > NGTD 8/8 UCX > NGTD 8/15 Resp culture>  MRSA   Antimicrobials:  Cefepime 7/31 > 8/4  Flagyl 7/31  Remdesivir 8/1 > 8/5  Actemra 8/1  Decadron/solumedrol 8/1  >8/8  Zosyn 8/8>8/9 Azithro 8/7>8/9   Cefepime 8/14 >  Vanc 7/31, 8/8>    Interim history/subjective:   Doing some weaning on pressure support 14/5 again, lasted 3 hours yesterday, potassium slightly elevated this morning  Objective   Blood pressure 135/66, pulse 98, temperature 98.8 F (37.1 C), temperature source Oral, resp. rate (!) 28, height 6' (1.829 m), weight 104.2 kg, SpO2 99 %. CVP:  [11 mmHg-15 mmHg] 11 mmHg  Vent Mode: PSV;CPAP FiO2 (%):  [30 %] 30 % Set Rate:  [26 bmp] 26 bmp Vt Set:  [460 mL] 460 mL PEEP:  [5 cmH20] 5 cmH20 Pressure Support:  [12 cmH20-14 cmH20] 12 cmH20 Plateau Pressure:  [18 cmH20-24 cmH20] 18 cmH20   Intake/Output Summary (Last 24 hours) at 04/30/2019 1042 Last data filed at 04/30/2019 1000 Gross per 24 hour  Intake 4096.68 ml  Output 2175 ml  Net 1921.68 ml   Filed Weights   04/29/19 0000 04/29/19 0350 04/30/19 0400  Weight: 105 kg 105 kg 104.2 kg    Examination:  General:  In bed on vent HENT: NCAT ETT in place PULM: Few crackels, wheezes bilaterally, vent supported breathing CV: RRR, no mgr GI: BS+, soft, nontender MSK: normal bulk and tone Neuro: awake, able to follow commands   8/14 CXR> ETT in place, lungs fairly clear compared to admission   Resolved Hospital Problem list     Assessment & Plan:  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: improving Continue pressure support wean as long as tolerated Ventilator associated pneumonia prevention protocol Resume full support from ventilator at night Need to consider tracheostomy  MRSA pneumonia Continue vancomycin  COPD exacerbation:  resolved Continue brovana/pulmicort  Need for sedation/acute encephalopathy Wean off sedation for RASS target 0 to -1  Best practice:  Diet: Tube feeding Pain/Anxiety/Delirium protocol (if indicated): As above VAP protocol (if indicated): Yes DVT prophylaxis: Lovenox GI prophylaxis: Pantoprazole for stress ulcer prophylaxis Glucose control:  SSI Mobility: bed rest Code Status: full Family Communication: per [facility] Disposition: remain in ICU  Labs   CBC: Recent Labs  Lab 04/26/19 0520 04/26/19 1355 04/27/19 0610 04/28/19 0440 04/29/19 0500 04/30/19 0350  WBC 6.4 6.7 7.0 7.0 10.2 9.0  NEUTROABS 5.0  --  5.4 5.3 8.5* 7.7  HGB 9.7* 9.5* 9.8* 9.7* 10.5* 9.9*  HCT 31.3* 30.5* 32.1* 32.1* 35.1* 33.0*  MCV 97.5 97.4 97.0 99.1 100.3* 100.6*  PLT 102* 92* 123* 126* 131* 108*    Basic Metabolic Panel: Recent Labs  Lab 04/26/19 0520  04/27/19 0610  04/27/19 2315 04/28/19 0440 04/28/19 1636 04/29/19 0500 04/30/19 0350  NA 153*   < > 153*   < > 152* 154* 142 151* 150*  K 4.4   < > 4.4   < > 4.6 4.7 3.8 4.8 5.2*  CL 122*   < > 121*   < > 120* 121* 116* 120* 121*  CO2 26   < > 27   < > 25 27 21* 26 26  GLUCOSE 239*   < > 253*   < > 371* 226* 480* 183* 228*  BUN 86*   < > 75*   < > 70* 71* 57* 64* 61*  CREATININE 1.70*   < > 1.41*   < > 1.33* 1.31* 1.09 1.18 1.25*  CALCIUM 8.2*   < > 8.3*   < > 8.2* 8.3* 6.9* 8.6* 8.6*  MG 2.7*  --  2.5*  --   --  2.4  --  2.3 2.2   < > = values in this interval not displayed.   GFR: Estimated Creatinine Clearance: 72.5 mL/min (A) (by C-G formula based on SCr of 1.25 mg/dL (H)). Recent Labs  Lab 04/25/19 0542 04/26/19 0520  04/27/19 0610 04/28/19 0440 04/29/19 0500 04/30/19 0350  PROCALCITON 0.70 0.50  --  0.26  --   --   --   WBC  --  6.4   < > 7.0 7.0 10.2 9.0   < > = values in this interval not displayed.    Liver Function Tests: Recent Labs  Lab 04/26/19 0520 04/27/19 0610 04/28/19 0440 04/29/19 0500 04/30/19 0350  AST 59* 78* 60* 60* 45*  ALT 88* 122* 117* 115* 96*  ALKPHOS 63 68 72 78 79  BILITOT 0.3 0.4 0.4 0.3 0.5  PROT 5.1* 5.3* 5.5* 5.8* 5.7*  ALBUMIN 2.2* 2.1* 2.2* 2.3* 2.2*   No results for input(s): LIPASE, AMYLASE in the last 168 hours. No results for input(s): AMMONIA in the last 168 hours.  ABG    Component Value Date/Time   PHART 7.412  04/24/2019 0424   PCO2ART 36.9 04/24/2019 0424   PO2ART 84.0 04/24/2019 0424   HCO3 23.2 04/24/2019 0424   TCO2 24 04/24/2019 0424   ACIDBASEDEF 1.0 04/24/2019 0424   O2SAT 95.0 04/24/2019 0424     Coagulation Profile: No results for input(s): INR, PROTIME in the last 168 hours.  Cardiac Enzymes: No results for input(s): CKTOTAL, CKMB, CKMBINDEX, TROPONINI in the last 168 hours.  HbA1C: Hgb A1c MFr Bld  Date/Time Value Ref Range Status  04/11/2019 09:50 PM 7.4 (H) 4.8 - 5.6 % Final    Comment:    (  NOTE) Pre diabetes:          5.7%-6.4% Diabetes:              >6.4% Glycemic control for   <7.0% adults with diabetes   06/07/2017 12:02 AM 9.1 (H) 4.8 - 5.6 % Final    Comment:    (NOTE) Pre diabetes:          5.7%-6.4% Diabetes:              >6.4% Glycemic control for   <7.0% adults with diabetes     CBG: Recent Labs  Lab 04/29/19 1521 04/29/19 2047 04/29/19 2350 04/30/19 0408 04/30/19 0819  GLUCAP 172* 171* 185* 223* 185*     Critical care time: 33 minutes      Roselie Awkward, MD [place] PCCM Pager: [phone] Cell: [phone] If no response, call [phone]

## 2019-04-30 NOTE — Progress Notes (Signed)
[place] Progress Note Patient Name: Robert [name] DOB: 02-05-1951 MRN: [id number]   Date of Service  04/30/2019  HPI/Events of Note  NSVT - 27 beat run. BP = 138/69 with HR = 102    eICU Interventions  Will order: 1. 12 Lead EKG STAT. 2. Cycle Troponin. 3. Please send AM labs now.      Intervention Category Major Interventions: Arrhythmia - evaluation and management  Lusero Nordlund [place] 04/30/2019, 3:53 AM

## 2019-04-30 NOTE — Progress Notes (Addendum)
Dr Emmit Alexanders ordered patient to have Kayexalate per tube however patients core track is now clogged. Attempted with several methods to dislodge the tube with no success.

## 2019-04-30 NOTE — Progress Notes (Signed)
Cortrak Tube Team Note:  Cortrak team notified of clogged Cortrak tube. When stylet inserted noted large particle lodged in tube proximal to bridle clip. Tube massaged and particle broken up. Stylet able to be inserted. Tube flushed well.  Discussed above with RN.   No x-ray is required. RN may begin using tube.   If the tube becomes dislodged please keep the tube and contact the Cortrak team at [email or web address] (password [email or web address]) for replacement.  If after hours and replacement cannot be delayed, place a NG tube and confirm placement with an abdominal x-ray.    [place]night, [place], [place] Pager [phone] After Hours Pager

## 2019-04-30 NOTE — Progress Notes (Signed)
[facility] PCCM  I tried calling his daughter, had to leave a message.  Roselie Awkward, MD [place] P[facility]M Pager: [phone] Cell: [phone] If no response, call [phone]

## 2019-04-30 NOTE — Progress Notes (Signed)
Daughter, Maudie Mercury, updated via telephone; placed on speaker phone to encourage patient.

## 2019-05-01 LAB — CBC WITH DIFFERENTIAL/PLATELET
Abs Immature Granulocytes: 0.04 10*3/uL (ref 0.00–0.07)
Basophils Absolute: 0 10*3/uL (ref 0.0–0.1)
Basophils Relative: 1 %
Eosinophils Absolute: 0.2 10*3/uL (ref 0.0–0.5)
Eosinophils Relative: 3 %
HCT: 28.8 % — ABNORMAL LOW (ref 39.0–52.0)
Hemoglobin: 8.8 g/dL — ABNORMAL LOW (ref 13.0–17.0)
Immature Granulocytes: 1 %
Lymphocytes Relative: 10 %
Lymphs Abs: 0.7 10*3/uL (ref 0.7–4.0)
MCH: 30.2 pg (ref 26.0–34.0)
MCHC: 30.6 g/dL (ref 30.0–36.0)
MCV: 99 fL (ref 80.0–100.0)
Monocytes Absolute: 0.4 10*3/uL (ref 0.1–1.0)
Monocytes Relative: 6 %
Neutro Abs: 6 10*3/uL (ref 1.7–7.7)
Neutrophils Relative %: 79 %
Platelets: 106 10*3/uL — ABNORMAL LOW (ref 150–400)
RBC: 2.91 MIL/uL — ABNORMAL LOW (ref 4.22–5.81)
RDW: 17.4 % — ABNORMAL HIGH (ref 11.5–15.5)
WBC: 7.5 10*3/uL (ref 4.0–10.5)
nRBC: 0 % (ref 0.0–0.2)

## 2019-05-01 LAB — COMPREHENSIVE METABOLIC PANEL
ALT: 90 U/L — ABNORMAL HIGH (ref 0–44)
AST: 47 U/L — ABNORMAL HIGH (ref 15–41)
Albumin: 2.1 g/dL — ABNORMAL LOW (ref 3.5–5.0)
Alkaline Phosphatase: 84 U/L (ref 38–126)
Anion gap: 4 — ABNORMAL LOW (ref 5–15)
BUN: 60 mg/dL — ABNORMAL HIGH (ref 8–23)
CO2: 26 mmol/L (ref 22–32)
Calcium: 8.2 mg/dL — ABNORMAL LOW (ref 8.9–10.3)
Chloride: 118 mmol/L — ABNORMAL HIGH (ref 98–111)
Creatinine, Ser: 1.18 mg/dL (ref 0.61–1.24)
GFR calc Af Amer: >60 mL/min (ref >60)
GFR calc non Af Amer: >60 mL/min (ref >60)
Glucose, Bld: 162 mg/dL — ABNORMAL HIGH (ref 70–99)
Potassium: 4.3 mmol/L (ref 3.5–5.1)
Sodium: 148 mmol/L — ABNORMAL HIGH (ref 135–145)
Total Bilirubin: 0.3 mg/dL (ref 0.3–1.2)
Total Protein: 5.3 g/dL — ABNORMAL LOW (ref 6.5–8.1)

## 2019-05-01 LAB — MAGNESIUM: Magnesium: 2.3 mg/dL (ref 1.7–2.4)

## 2019-05-01 LAB — GLUCOSE, CAPILLARY
Glucose-Capillary: 135 mg/dL — ABNORMAL HIGH (ref 70–99)
Glucose-Capillary: 139 mg/dL — ABNORMAL HIGH (ref 70–99)
Glucose-Capillary: 147 mg/dL — ABNORMAL HIGH (ref 70–99)
Glucose-Capillary: 111 mg/dL — ABNORMAL HIGH (ref 70–99)
Glucose-Capillary: 86 mg/dL (ref 70–99)

## 2019-05-01 LAB — C-REACTIVE PROTEIN: CRP: 1.1 mg/dL — ABNORMAL HIGH (ref <1.0)

## 2019-05-01 LAB — D-DIMER, QUANTITATIVE: D-Dimer, Quant: 2.52 ug/mL-FEU — ABNORMAL HIGH (ref 0.00–0.50)

## 2019-05-01 MED ORDER — INSULIN GLARGINE 100 UNIT/ML ~~LOC~~ SOLN
35.0000 [IU] | Freq: Two times a day (BID) | SUBCUTANEOUS | Status: DC
  Administered 2019-05-01 – 2019-05-02 (×2): 35 [IU] via SUBCUTANEOUS
  Filled 2019-05-01 (×3): qty 0.35

## 2019-05-01 MED ORDER — INSULIN ASPART 100 UNIT/ML ~~LOC~~ SOLN
10.0000 [IU] | SUBCUTANEOUS | Status: DC
  Administered 2019-05-01 – 2019-05-03 (×5): 10 [IU] via SUBCUTANEOUS

## 2019-05-01 MED ORDER — BETHANECHOL CHLORIDE 10 MG PO TABS
10.0000 mg | ORAL_TABLET | Freq: Every day | ORAL | Status: DC
  Administered 2019-05-01 – 2019-05-02 (×2): 10 mg
  Filled 2019-05-01 (×3): qty 1

## 2019-05-01 MED ORDER — SODIUM CHLORIDE 0.9 % IV SOLN
INTRAVENOUS | Status: DC | PRN
  Administered 2019-05-01 – 2019-05-04 (×3): 250 mL via INTRAVENOUS
  Administered 2019-05-19: 21:00:00 1000 mL via INTRAVENOUS

## 2019-05-01 NOTE — Progress Notes (Signed)
NAME:  Robert [name], MRN:  [id number], DOB:  Aug 28, 1951, LOS: 4 ADMISSION DATE:  04/11/2019, CONSULTATION DATE:  8/1 REFERRING MD:  Roderic Palau, CHIEF COMPLAINT:  Dyspnea   Brief History   68 year old male admitted on 8/1 in the setting of ARDS from COVID-19 pneumonia. Initially presented to [facility] on 7/31 and required intubation. Post-procedure in the ED, patient awakened and self-extubated and required re-intubation. At [facility], tolerated PS and subsequently extubated on 8/2 however required intubation within 24 hours. Hospital course complicated by septic shock 2/2 MRSA pneumonia and volume overload.   Past Medical History  Anxiety COPD GERD CAD Hypertension Hyperlipidemia Rheumatoid arthritis History of systolic heart failure, however 08/2018 Echo showed LVEF 50-55%, left atrium dilated  Significant Hospital Events   8/1 admission to [facility] ICU 8/2 extubated 8/3 early AM agitated, placed on BIPAP, required re-intubation 8/4 started on steroids for COPD exacerbation 8/8 septic shock requiring pressor support 8/9 off pressors, respiratory cultures +s.aureus 8/10 persistent encephalopathy off sedation, CT head ordered 8/11 encephalopathy improved 8/12 difficulty weaning due to tachypnea, ETT exchanged due to dried blood clot burden 8/13 Bleeding from subclavian CVL.  Exchanged for PICC line 8/17 started weaning on PSV 14/5  Consults:  PCCM  Procedures:  8/1 ETT >8/2, 8/3 >  8/1 L subclavian CVL >8/13 8/13 Rt PICC  Significant Diagnostic Tests:  08/2018 Echo> LVEF 50-55%, left atrium dilated  CT head 8/11- mild age-related atrophy and chronic microvascular ischemic changes.  Old lacunar infarct.  Micro Data:  7/31 blood >NGTD 7/31 sars-cov-2 > positive 8/1 Resp CX > Normal flora 8/7 Resp CX > MRSA 8/7 BCX > NGTD 8/8 UCX > NGTD 8/15 Resp culture>  MRSA   Antimicrobials:  Cefepime 7/31 > 8/4  Flagyl 7/31  Remdesivir 8/1 > 8/5  Actemra 8/1  Decadron/solumedrol 8/1  >8/8  Zosyn 8/8>8/9 Azithro 8/7>8/9   Cefepime 8/14 >  Vanc 7/31, 8/8>    Interim history/subjective:   Weaned on 12/5 pressure support all day yesterday, then back on PRVC overnight Strength improving some  Objective   Blood pressure (!) 136/59, pulse 90, temperature 99.4 F (37.4 C), temperature source Oral, resp. rate (!) 34, height 6' (1.829 m), weight 106.5 kg, SpO2 99 %. CVP:  [8 mmHg-11 mmHg] 11 mmHg  Vent Mode: PRVC FiO2 (%):  [30 %] 30 % Set Rate:  [26 bmp] 26 bmp Vt Set:  [460 mL] 460 mL PEEP:  [5 cmH20] 5 cmH20 Pressure Support:  [12 cmH20] 12 cmH20 Plateau Pressure:  [15 cmH20-20 cmH20] 15 cmH20   Intake/Output Summary (Last 24 hours) at 05/01/2019 0746 Last data filed at 05/01/2019 0543 Gross per 24 hour  Intake 3592.37 ml  Output 2125 ml  Net 1467.37 ml   Filed Weights   04/29/19 0350 04/30/19 0400 05/01/19 0400  Weight: 105 kg 104.2 kg 106.5 kg    Examination:  General:  In bed on vent HENT: NCAT ETT in place PULM: CTA B, vent supported breathing CV: RRR, no mgr GI: BS+, soft, nontender MSK: normal bulk and tone Neuro: awake, alert, very weak but can move hands, feet   8/14 CXR> ETT in place, lungs fairly clear compared to admission   Resolved Hospital Problem list     Assessment & Plan:  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: improving Generalized weakness contributing to inability to wean from vent, slowly improving Continue pressure support wean as long as tolerated: today 12/5 again Ventilator associated pneumonia prevention protocol Resume full support at  night If not extubated in next 4-5 days will need to consider tracheostomy PT consult  MRSA pneumonia Continue vancomycin through 8/22  COPD exacerbation: resolved Continue brovana/pulmicort  Need for sedation/acute encephalopathy Wean off sedation for RASS target 0 to -1   Best practice:  Diet: Tube feeding Pain/Anxiety/Delirium protocol (if indicated): As above VAP protocol  (if indicated): Yes DVT prophylaxis: Lovenox GI prophylaxis: Pantoprazole for stress ulcer prophylaxis Glucose control: SSI Mobility: bed rest Code Status: full Family Communication:  Disposition: remain in ICU  Labs   CBC: Recent Labs  Lab 04/27/19 0610 04/28/19 0440 04/29/19 0500 04/30/19 0350 05/01/19 0445  WBC 7.0 7.0 10.2 9.0 7.5  NEUTROABS 5.4 5.3 8.5* 7.7 6.0  HGB 9.8* 9.7* 10.5* 9.9* 8.8*  HCT 32.1* 32.1* 35.1* 33.0* 28.8*  MCV 97.0 99.1 100.3* 100.6* 99.0  PLT 123* 126* 131* 108* 106*    Basic Metabolic Panel: Recent Labs  Lab 04/27/19 0610  04/28/19 0440 04/28/19 1636 04/29/19 0500 04/30/19 0350 04/30/19 1200 05/01/19 0445  NA 153*   < > 154* 142 151* 150* 151* 148*  K 4.4   < > 4.7 3.8 4.8 5.2* 4.6 4.3  CL 121*   < > 121* 116* 120* 121* 123* 118*  CO2 27   < > 27 21* 26 26 23 26   GLUCOSE 253*   < > 226* 480* 183* 228* 199* 162*  BUN 75*   < > 71* 57* 64* 61* 60* 60*  CREATININE 1.41*   < > 1.31* 1.09 1.18 1.25* 1.16 1.18  CALCIUM 8.3*   < > 8.3* 6.9* 8.6* 8.6* 8.3* 8.2*  MG 2.5*  --  2.4  --  2.3 2.2  --  2.3   < > = values in this interval not displayed.   GFR: Estimated Creatinine Clearance: 77.7 mL/min (by C-G formula based on SCr of 1.18 mg/dL). Recent Labs  Lab 04/25/19 0542 04/26/19 0520  04/27/19 0610 04/28/19 0440 04/29/19 0500 04/30/19 0350 05/01/19 0445  PROCALCITON 0.70 0.50  --  0.26  --   --   --   --   WBC  --  6.4   < > 7.0 7.0 10.2 9.0 7.5   < > = values in this interval not displayed.    Liver Function Tests: Recent Labs  Lab 04/27/19 0610 04/28/19 0440 04/29/19 0500 04/30/19 0350 05/01/19 0445  AST 78* 60* 60* 45* 47*  ALT 122* 117* 115* 96* 90*  ALKPHOS 68 72 78 79 84  BILITOT 0.4 0.4 0.3 0.5 0.3  PROT 5.3* 5.5* 5.8* 5.7* 5.3*  ALBUMIN 2.1* 2.2* 2.3* 2.2* 2.1*   No results for input(s): LIPASE, AMYLASE in the last 168 hours. No results for input(s): AMMONIA in the last 168 hours.  ABG    Component Value  Date/Time   PHART 7.412 04/24/2019 0424   PCO2ART 36.9 04/24/2019 0424   PO2ART 84.0 04/24/2019 0424   HCO3 23.2 04/24/2019 0424   TCO2 24 04/24/2019 0424   ACIDBASEDEF 1.0 04/24/2019 0424   O2SAT 95.0 04/24/2019 0424     Coagulation Profile: No results for input(s): INR, PROTIME in the last 168 hours.  Cardiac Enzymes: No results for input(s): CKTOTAL, CKMB, CKMBINDEX, TROPONINI in the last 168 hours.  HbA1C: Hgb A1c MFr Bld  Date/Time Value Ref Range Status  04/11/2019 09:50 PM 7.4 (H) 4.8 - 5.6 % Final    Comment:    (NOTE) Pre diabetes:          5.7%-6.4% Diabetes:              >  6.4% Glycemic control for   <7.0% adults with diabetes   06/07/2017 12:02 AM 9.1 (H) 4.8 - 5.6 % Final    Comment:    (NOTE) Pre diabetes:          5.7%-6.4% Diabetes:              >6.4% Glycemic control for   <7.0% adults with diabetes     CBG: Recent Labs  Lab 04/30/19 1210 04/30/19 1633 04/30/19 1957 04/30/19 2335 05/01/19 0424  GLUCAP 188* 187* 173* 193* 135*     Critical care time: 33 minutes      Roselie Awkward, MD [place] P[facility]M Pager: [phone] Cell: [phone] If no response, call [phone]

## 2019-05-01 NOTE — Plan of Care (Signed)
  Problem: Clinical Measurements: Goal: Respiratory complications will improve Outcome: Progressing   Problem: Pain Managment: Goal: General experience of comfort will improve Outcome: Progressing   Problem: Safety: Goal: Ability to remain free from injury will improve Outcome: Progressing   Problem: Respiratory: Goal: Will maintain a patent airway Outcome: Progressing

## 2019-05-01 NOTE — Progress Notes (Signed)
[facility] PCCM  Daughter updated by phone, questions answered  Roselie Awkward, MD [place] PCCM Pager: [phone] Cell: [phone] If no response, call [phone]

## 2019-05-01 NOTE — Progress Notes (Signed)
Pt foley was removed yesterday afternoon. Pt had not voided since. Bladder scan showed greater than 750 mL. Peri care performed before performing in and out cath. Sterile technique was used during the procedure. Removed 1150 amber colored urine. Peri care performed after. New condom cath and bag placed.

## 2019-05-01 NOTE — Progress Notes (Signed)
Daughter called unit for updates, all questions answered.

## 2019-05-01 NOTE — Progress Notes (Signed)
PROGRESS NOTE  Robert [name] TKZ:[id number] DOB: 17-Jul-1951 DOA: 04/11/2019 PCP: Redmond School, MD   LOS: 19 days   Brief Narrative / Interim history: 68 year old male with history of COPD, CAD, diabetes mellitus, ischemic cardiomyopathy, RA, presented to the hospital and was admitted on 04/11/2019 with shortness of breath, he was found to be significantly hypoxic, tested positive for COVID-19, was intubated and transferred to [facility] for further management.  Significant events: 8/1 admission to [facility] ICU 8/2 extubated 8/3 early AM agitated, placed on BIPAP, required re-intubation 8/4 started on steroids for COPD exacerbation 8/8 septic shock requiring pressor support 8/9 off pressors, respiratory cultures +s.aureus 8/10 persistent encephalopathy off sedation, CT head ordered 8/11 encephalopathy improved 8/12 difficulty weaning due to tachypnea, ETT exchanged due to clot burden 8/13 PICC line inserted and subclavian central line removed 8/14 Antibiotics coverage broaden due to persistent fever and resp distress, pan cultured  8/19 more alert, tolerating pressure support  Subjective: Alert, interactive, nods appropriately to questions.  Assessment & Plan: Principal Problem:   Pneumonia due to COVID-19 virus Active Problems:   Rheumatoid arthritis ([place])   Hyperlipidemia   COPD (chronic obstructive pulmonary disease) (HCC)   Controlled type 2 diabetes mellitus with diabetic peripheral angiopathy without gangrene, with long-term current use of insulin (HCC)   Essential hypertension   Ischemic cardiomyopathy   Coronary artery disease   Dyslipidemia (high LDL; low HDL)   COVID-19 virus detected   Acute respiratory distress syndrome (ARDS) due to COVID-19 virus   Atrial fibrillation with RVR (HCC)   COPD (chronic obstructive pulmonary disease) with emphysema (HCC)   AKI (acute kidney injury) ([place])   Diabetes mellitus type 2, uncontrolled, with complications (HCC)   Pressure injury of  skin   Staphylococcal pneumonia ([place])   HCAP (healthcare-associated pneumonia)   Principal Problem Acute Hypoxic Respiratory Failure due to Covid-19 Viral Illness, COPD exacerbation -Remains on pressure support this morning, weaning trials daily.  Is on pressure support 12/5 this morning  Vent Mode: PRVC FiO2 (%):  [30 %] 30 % Set Rate:  [26 bmp] 26 bmp Vt Set:  [460 mL] 460 mL PEEP:  [5 cmH20] 5 cmH20 Pressure Support:  [12 cmH20] 12 cmH20 Plateau Pressure:  [15 cmH20-20 cmH20] 15 cmH20   Fever: afebrile  Temp (24hrs), Avg:99.2 F (37.3 C), Min:98.7 F (37.1 C), Max:99.5 F (37.5 C)  Antibiotics:  Cefepime 7/31 >8/4  Flagyl 7/31  Remdesivir 8/1 >8/5  Actemra 8/1  Decadron/solumedrol 8/1 >8/8  Zosyn 8/8>8/9 Azithro 8/7>8/9 Cefepime 8/14 >8/19 Vanc 7/31, 8/8>  Remdesivir: 8/1 >> 8/5 Steroids: decadron / solumedrol 8/1 >> 8/5 Diuretics: 8/1 >> 8/5 Actemra: 8/1 Convalescent Plasma: none  Vitamin C and Zinc: yes   COVID-19 Labs  Recent Labs    04/29/19 0500 04/30/19 0350 05/01/19 0445  DDIMER 3.30* 2.62* 2.52*  CRP <0.8 1.1* 1.1*    Lab Results  Component Value Date   SARSCOV2NAA POSITIVE (A) 04/11/2019   SARSCOV2NAA Detected (A) 04/11/2019    Active Problems MRSA HCAP /septic shock -Off pressors now -Currently on vancomycin, afebrile, plan to end vancomycin on 8/22 which would give him a total of 3 weeks.  Continue to monitor fever curve/secretions  Paroxysmal A. fib with RVR -Currently in sinus, on metoprolol 25 3 times daily -He was on IV heparin but no anticoagulation is on hold due to bleeding with ETT clotting as well as bleeding from the subclavian line -Currently on heparin prophylaxis  Acute on chronic diastolic CHF, essential hypertension, history  of CAD -History of PCI in 2018 with in-stent restenosis, currently on aspirin and DVT prophylaxis and his Plavix is on hold as there are concerns that the patient may need tracheostomy  -Initially diuresed now on hold due to hypernatremia -Volume status stable on exam, allow free water  Hypernatremia -Sodium improving at 148, continue to monitor  Acute kidney injury -Creatinine peaked as high as 2.39 on 04/24/2019, possibly multifactorial in the setting of diuresis and use of vancomycin -He was also found to have retention on 8/13 requiring Foley catheter placement -Now on tamsulosin, however was unable to void in the past 24 hours.  Continue to monitor with bladder scans, may need a Foley catheter inserted back if he continues to retain  ETT clotting/bleeding from the subclavian line -Patient was on therapeutic heparin, ET tube was clotted and exchange on 04/23/2019.  Currently on heparin prophylaxis -Hemoglobin overall stable, continue to monitor  Thrombocytopenia -Chronic, for a number of years, intermittent, stable  Type 2 diabetes mellitus, uncontrolled with hyperglycemia with vascular disease and neuropathy at baseline -Continue Lantus, tube feed coverage, sliding scale.  Continue gabapentin.  Currently on resistant sliding scale every 4, plus NovoLog 12 units every 4 along with Lantus 45 units twice daily.  He is on dextrose infusion as well for his hyper natremia -CBGs stable  RA -He is on Actemra and methotrexate at home, currently on hold  Pressure ulcer of medial sacrum bilateral buttocks Pressure Injury 04/17/19 Sacrum Medial Deep Tissue Injury - Purple or maroon localized area of discolored intact skin or blood-filled blister due to damage of underlying soft tissue from pressure and/or shear. dark purple with some skin sloughing on lowe (Active)  04/17/19 0730  Location: Sacrum  Location Orientation: Medial  Staging: Deep Tissue Injury - Purple or maroon localized area of discolored intact skin or blood-filled blister due to damage of underlying soft tissue from pressure and/or shear.  Wound Description (Comments): dark purple with some skin sloughing on  lower sacral area  Present on Admission: No     Pressure Injury 04/17/19 Buttocks Right;Lower Unstageable - Full thickness tissue loss in which the base of the ulcer is covered by slough (yellow, tan, gray, green or brown) and/or eschar (tan, brown or black) in the wound bed. (Active)  04/17/19 0730  Location: Buttocks  Location Orientation: Right;Lower  Staging: Unstageable - Full thickness tissue loss in which the base of the ulcer is covered by slough (yellow, tan, gray, green or brown) and/or eschar (tan, brown or black) in the wound bed.  Wound Description (Comments):   Present on Admission: No     Pressure Injury 04/17/19 Buttocks Left;Lower Deep Tissue Injury - Purple or maroon localized area of discolored intact skin or blood-filled blister due to damage of underlying soft tissue from pressure and/or shear. (Active)  04/17/19 0730  Location: Buttocks  Location Orientation: Left;Lower  Staging: Deep Tissue Injury - Purple or maroon localized area of discolored intact skin or blood-filled blister due to damage of underlying soft tissue from pressure and/or shear.  Wound Description (Comments):   Present on Admission: No        Scheduled Meds: . arformoterol  15 mcg Nebulization BID  . aspirin  81 mg Per Tube Daily  . budesonide (PULMICORT) nebulizer solution  0.5 mg Nebulization BID  . chlorhexidine gluconate (MEDLINE KIT)  15 mL Mouth Rinse BID  . Chlorhexidine Gluconate Cloth  6 each Topical Daily  . feeding supplement (PRO-STAT SUGAR FREE 64)  30  mL Per Tube TID  . folic acid  1 mg Per Tube Daily  . free water  200 mL Per Tube Q4H  . gabapentin  100 mg Per Tube Q8H  . heparin injection (subcutaneous)  7,500 Units Subcutaneous Q8H  . insulin aspart  0-20 Units Subcutaneous Q4H  . insulin aspart  12 Units Subcutaneous Q4H  . insulin glargine  45 Units Subcutaneous BID  . mouth rinse  15 mL Mouth Rinse 10 times per day  . metoprolol tartrate  25 mg Per Tube TID  .  multivitamin  15 mL Per Tube Daily  . pantoprazole sodium  40 mg Per Tube Daily  . sodium chloride flush  10-40 mL Intracatheter Q12H  . tamsulosin  0.4 mg Oral QPC supper  . traZODone  50 mg Oral QHS  . vitamin C  500 mg Per Tube Daily  . zinc sulfate  220 mg Per Tube Daily   Continuous Infusions: . dextrose 75 mL/hr at 05/01/19 0034  . feeding supplement (VITAL AF 1.2 CAL) 1,000 mL (05/01/19 0255)  . vancomycin 1,500 mg (04/30/19 2245)   PRN Meds:.acetaminophen, dextrose, fentaNYL (SUBLIMAZE) injection, hydrALAZINE, HYDROmorphone, ipratropium-albuterol, sodium chloride flush, Thrombi-Pad   DVT prophylaxis: heparin Code Status: Full code Family Communication: Per PCCM Disposition Plan: remain in ICU   Consultants:   PCCM  Procedures:   None   Micro data: 7/31 blood >NGTD 7/31 sars-cov-2 >positive 8/1 Resp CX > Normal flora 8/7 Resp CX >MRSA 8/7 BCX > NGTD 8/8 UCX > NGTD 8/15 Resp culture>  MRSA    Objective: Vitals:   05/01/19 0543 05/01/19 0600 05/01/19 0700 05/01/19 0758  BP: (!) 144/56 140/65 (!) 136/59   Pulse: 91 90 90   Resp: (!) 32 (!) 29 (!) 34   Temp:    98.7 F (37.1 C)  TempSrc:    Oral  SpO2:  100% 99%   Weight:      Height:        Intake/Output Summary (Last 24 hours) at 05/01/2019 1042 Last data filed at 05/01/2019 0543 Gross per 24 hour  Intake 2955.73 ml  Output 1750 ml  Net 1205.73 ml   Filed Weights   04/29/19 0350 04/30/19 0400 05/01/19 0400  Weight: 105 kg 104.2 kg 106.5 kg    Examination:  Constitutional: No distress, awake Eyes: No icterus ENMT: Moist mucous membranes Neck: normal, supple Respiratory: Breathing with the vent, overall clear, no wheezing, no crackles Cardiovascular: Regular rate and rhythm, no murmurs.  No edema Abdomen: Positive bowel sounds Musculoskeletal: no clubbing / cyanosis. Skin: No rashes Neurologic: Overall weak but follows commands  Data Reviewed: I have independently reviewed following  labs and imaging studies    CBC: Recent Labs  Lab 05/27/2019 0610 04/28/19 0440 04/29/19 0500 04/30/19 0350 05/01/19 0445  WBC 7.0 7.0 10.2 9.0 7.5  NEUTROABS 5.4 5.3 8.5* 7.7 6.0  HGB 9.8* 9.7* 10.5* 9.9* 8.8*  HCT 32.1* 32.1* 35.1* 33.0* 28.8*  MCV 97.0 99.1 100.3* 100.6* 99.0  PLT 123* 126* 131* 108* 151*   Basic Metabolic Panel: Recent Labs  Lab 05-27-2019 0610  04/28/19 0440 04/28/19 1636 04/29/19 0500 04/30/19 0350 04/30/19 1200 05/01/19 0445  NA 153*   < > 154* 142 151* 150* 151* 148*  K 4.4   < > 4.7 3.8 4.8 5.2* 4.6 4.3  CL 121*   < > 121* 116* 120* 121* 123* 118*  CO2 27   < > 27 21* 26 26 23 26   GLUCOSE  253*   < > 226* 480* 183* 228* 199* 162*  BUN 75*   < > 71* 57* 64* 61* 60* 60*  CREATININE 1.41*   < > 1.31* 1.09 1.18 1.25* 1.16 1.18  CALCIUM 8.3*   < > 8.3* 6.9* 8.6* 8.6* 8.3* 8.2*  MG 2.5*  --  2.4  --  2.3 2.2  --  2.3   < > = values in this interval not displayed.   GFR: Estimated Creatinine Clearance: 77.7 mL/min (by C-G formula based on SCr of 1.18 mg/dL). Liver Function Tests: Recent Labs  Lab 04/27/19 0610 04/28/19 0440 04/29/19 0500 04/30/19 0350 05/01/19 0445  AST 78* 60* 60* 45* 47*  ALT 122* 117* 115* 96* 90*  ALKPHOS 68 72 78 79 84  BILITOT 0.4 0.4 0.3 0.5 0.3  PROT 5.3* 5.5* 5.8* 5.7* 5.3*  ALBUMIN 2.1* 2.2* 2.3* 2.2* 2.1*   No results for input(s): LIPASE, AMYLASE in the last 168 hours. No results for input(s): AMMONIA in the last 168 hours. Coagulation Profile: No results for input(s): INR, PROTIME in the last 168 hours. Cardiac Enzymes: No results for input(s): CKTOTAL, CKMB, CKMBINDEX, TROPONINI in the last 168 hours. BNP (last 3 results) No results for input(s): PROBNP in the last 8760 hours. HbA1C: No results for input(s): HGBA1C in the last 72 hours. CBG: Recent Labs  Lab 04/30/19 1633 04/30/19 1957 04/30/19 2335 05/01/19 0424 05/01/19 0810  GLUCAP 187* 173* 193* 135* 139*   Lipid Profile: No results for  input(s): CHOL, HDL, LDLCALC, TRIG, CHOLHDL, LDLDIRECT in the last 72 hours. Thyroid Function Tests: No results for input(s): TSH, T4TOTAL, FREET4, T3FREE, THYROIDAB in the last 72 hours. Anemia Panel: No results for input(s): VITAMINB12, FOLATE, FERRITIN, TIBC, IRON, RETICCTPCT in the last 72 hours. Urine analysis:    Component Value Date/Time   COLORURINE YELLOW 04/25/2019 0826   APPEARANCEUR HAZY (A) 04/25/2019 0826   LABSPEC 1.015 04/25/2019 0826   PHURINE 5.0 04/25/2019 0826   GLUCOSEU 150 (A) 04/25/2019 0826   HGBUR LARGE (A) 04/25/2019 0826   BILIRUBINUR NEGATIVE 04/25/2019 0826   KETONESUR NEGATIVE 04/25/2019 0826   PROTEINUR 30 (A) 04/25/2019 0826   UROBILINOGEN 0.2 12/22/2011 2130   NITRITE NEGATIVE 04/25/2019 0826   LEUKOCYTESUR NEGATIVE 04/25/2019 0826   Sepsis Labs: Invalid input(s): PROCALCITONIN, LACTICIDVEN  Recent Results (from the past 240 hour(s))  Culture, blood (routine x 2)     Status: None   Collection Time: 04/25/19  9:40 AM   Specimen: BLOOD LEFT HAND  Result Value Ref Range Status   Specimen Description   Final    BLOOD LEFT HAND Performed at [facility] [address] [place], [place] [address]    Special Requests   Final    BOTTLES DRAWN AEROBIC ONLY Blood Culture results may not be optimal due to an inadequate volume of blood received in culture bottles Performed at [facility] [address], [place], [place] [address]    Culture   Final    NO GROWTH 5 DAYS Performed at [facility], [place] [address], [place], [place] [address]    Report Status 04/30/2019 FINAL  Final  Culture, blood (routine x 2)     Status: None   Collection Time: 04/25/19  9:45 AM   Specimen: BLOOD RIGHT HAND  Result Value Ref Range Status   Specimen Description   Final    BLOOD RIGHT HAND Performed at [facility] [address]  [place], [place] [address]    Special Requests   Final    BOTTLES DRAWN  AEROBIC ONLY Blood Culture adequate volume Performed at [facility] [address] [place], [place] [address]    Culture   Final    NO GROWTH 5 DAYS Performed at [facility], [place] [address] [place], [place] [address]    Report Status 04/30/2019 FINAL  Final  Culture, respiratory (non-expectorated)     Status: None   Collection Time: 04/26/19  8:46 AM   Specimen: Tracheal Aspirate; Respiratory  Result Value Ref Range Status   Specimen Description   Final    TRACHEAL ASPIRATE Performed at [facility] [address], [place], [place] [address]    Special Requests   Final    NONE Performed at [facility], [place] [address], [place], [place] [address]    Gram Stain   Final    FEW WBC PRESENT, PREDOMINANTLY PMN FEW GRAM POSITIVE COCCI IN CLUSTERS FEW GRAM NEGATIVE RODS RARE GRAM POSITIVE RODS RARE YEAST Performed at [facility], [place] [address] [place],  [address]    Culture   Final    ABUNDANT METHICILLIN RESISTANT STAPHYLOCOCCUS AUREUS   Report Status 04/29/2019 FINAL  Final   Organism ID, Bacteria METHICILLIN RESISTANT STAPHYLOCOCCUS AUREUS  Final      Susceptibility   Methicillin resistant staphylococcus aureus - MIC*    CIPROFLOXACIN <=0.5 SENSITIVE Sensitive     ERYTHROMYCIN >=8 RESISTANT Resistant     GENTAMICIN <=0.5 SENSITIVE Sensitive     OXACILLIN >=4 RESISTANT Resistant     TETRACYCLINE <=1 SENSITIVE Sensitive     VANCOMYCIN 1 SENSITIVE Sensitive     TRIMETH/SULFA <=10 SENSITIVE Sensitive     CLINDAMYCIN <=0.25 SENSITIVE Sensitive     RIFAMPIN <=0.5 SENSITIVE Sensitive     Inducible Clindamycin NEGATIVE Sensitive     * ABUNDANT METHICILLIN RESISTANT STAPHYLOCOCCUS AUREUS      Radiology Studies: Dg Abd 1 View  Result Date: 04/30/2019 CLINICAL DATA:  NG tube placement EXAM: ABDOMEN - 1 VIEW COMPARISON:  None. FINDINGS: NG tube and feeding tube are in place, with the tip are both in  the distal stomach. IMPRESSION: NG tube and feeding tube tip in the distal stomach. Electronically Signed   By: Rolm Baptise M.D.   On: 04/30/2019 08:22   Marzetta Board, MD, PhD [facility]  Contact via  [email or web address]  [place] P: [phone] F: [phone]

## 2019-05-02 DIAGNOSIS — U071 COVID-19: Secondary | ICD-10-CM

## 2019-05-02 LAB — C-REACTIVE PROTEIN: CRP: 1.1 mg/dL — ABNORMAL HIGH (ref <1.0)

## 2019-05-02 LAB — COMPREHENSIVE METABOLIC PANEL
ALT: 80 U/L — ABNORMAL HIGH (ref 0–44)
AST: 47 U/L — ABNORMAL HIGH (ref 15–41)
Albumin: 2 g/dL — ABNORMAL LOW (ref 3.5–5.0)
Alkaline Phosphatase: 83 U/L (ref 38–126)
Anion gap: 4 — ABNORMAL LOW (ref 5–15)
BUN: 62 mg/dL — ABNORMAL HIGH (ref 8–23)
CO2: 25 mmol/L (ref 22–32)
Calcium: 8.5 mg/dL — ABNORMAL LOW (ref 8.9–10.3)
Chloride: 121 mmol/L — ABNORMAL HIGH (ref 98–111)
Creatinine, Ser: 1.27 mg/dL — ABNORMAL HIGH (ref 0.61–1.24)
GFR calc Af Amer: >60 mL/min (ref >60)
GFR calc non Af Amer: 58 mL/min — ABNORMAL LOW (ref >60)
Glucose, Bld: 113 mg/dL — ABNORMAL HIGH (ref 70–99)
Potassium: 4.5 mmol/L (ref 3.5–5.1)
Sodium: 150 mmol/L — ABNORMAL HIGH (ref 135–145)
Total Bilirubin: 0.2 mg/dL — ABNORMAL LOW (ref 0.3–1.2)
Total Protein: 5.5 g/dL — ABNORMAL LOW (ref 6.5–8.1)

## 2019-05-02 LAB — GLUCOSE, CAPILLARY
Glucose-Capillary: 100 mg/dL — ABNORMAL HIGH (ref 70–99)
Glucose-Capillary: 100 mg/dL — ABNORMAL HIGH (ref 70–99)
Glucose-Capillary: 103 mg/dL — ABNORMAL HIGH (ref 70–99)
Glucose-Capillary: 124 mg/dL — ABNORMAL HIGH (ref 70–99)
Glucose-Capillary: 93 mg/dL (ref 70–99)
Glucose-Capillary: 99 mg/dL (ref 70–99)
Glucose-Capillary: 99 mg/dL (ref 70–99)

## 2019-05-02 LAB — CBC WITH DIFFERENTIAL/PLATELET
Abs Immature Granulocytes: 0.05 10*3/uL (ref 0.00–0.07)
Basophils Absolute: 0.1 10*3/uL (ref 0.0–0.1)
Basophils Relative: 1 %
Eosinophils Absolute: 0.2 10*3/uL (ref 0.0–0.5)
Eosinophils Relative: 3 %
HCT: 28.7 % — ABNORMAL LOW (ref 39.0–52.0)
Hemoglobin: 8.9 g/dL — ABNORMAL LOW (ref 13.0–17.0)
Immature Granulocytes: 1 %
Lymphocytes Relative: 13 %
Lymphs Abs: 0.8 10*3/uL (ref 0.7–4.0)
MCH: 30.4 pg (ref 26.0–34.0)
MCHC: 31 g/dL (ref 30.0–36.0)
MCV: 98 fL (ref 80.0–100.0)
Monocytes Absolute: 0.3 10*3/uL (ref 0.1–1.0)
Monocytes Relative: 5 %
Neutro Abs: 5 10*3/uL (ref 1.7–7.7)
Neutrophils Relative %: 77 %
Platelets: 98 10*3/uL — ABNORMAL LOW (ref 150–400)
RBC: 2.93 MIL/uL — ABNORMAL LOW (ref 4.22–5.81)
RDW: 17.6 % — ABNORMAL HIGH (ref 11.5–15.5)
WBC: 6.5 10*3/uL (ref 4.0–10.5)
nRBC: 0 % (ref 0.0–0.2)

## 2019-05-02 LAB — D-DIMER, QUANTITATIVE: D-Dimer, Quant: 2.16 ug/mL-FEU — ABNORMAL HIGH (ref 0.00–0.50)

## 2019-05-02 LAB — MAGNESIUM: Magnesium: 2.2 mg/dL (ref 1.7–2.4)

## 2019-05-02 MED ORDER — FUROSEMIDE 10 MG/ML IJ SOLN
40.0000 mg | Freq: Two times a day (BID) | INTRAMUSCULAR | Status: DC
  Administered 2019-05-02 – 2019-05-06 (×9): 40 mg via INTRAVENOUS
  Filled 2019-05-02 (×9): qty 4

## 2019-05-02 MED ORDER — BETHANECHOL CHLORIDE 10 MG PO TABS
10.0000 mg | ORAL_TABLET | Freq: Two times a day (BID) | ORAL | Status: DC
  Administered 2019-05-03 – 2019-05-05 (×6): 10 mg
  Filled 2019-05-02 (×10): qty 1

## 2019-05-02 MED ORDER — INSULIN ASPART 100 UNIT/ML ~~LOC~~ SOLN
0.0000 [IU] | SUBCUTANEOUS | Status: DC
  Administered 2019-05-03 (×3): 2 [IU] via SUBCUTANEOUS
  Administered 2019-05-03 – 2019-05-04 (×2): 3 [IU] via SUBCUTANEOUS
  Administered 2019-05-04 (×2): 5 [IU] via SUBCUTANEOUS
  Administered 2019-05-04: 3 [IU] via SUBCUTANEOUS
  Administered 2019-05-04 (×2): 2 [IU] via SUBCUTANEOUS
  Administered 2019-05-05 (×2): 3 [IU] via SUBCUTANEOUS
  Administered 2019-05-05 (×2): 5 [IU] via SUBCUTANEOUS
  Administered 2019-05-05: 11 [IU] via SUBCUTANEOUS
  Administered 2019-05-06: 15 [IU] via SUBCUTANEOUS
  Administered 2019-05-06: 3 [IU] via SUBCUTANEOUS
  Administered 2019-05-06: 13:00:00 8 [IU] via SUBCUTANEOUS
  Administered 2019-05-06: 15 [IU] via SUBCUTANEOUS
  Administered 2019-05-06: 2 [IU] via SUBCUTANEOUS
  Administered 2019-05-06: 5 [IU] via SUBCUTANEOUS
  Administered 2019-05-07: 09:00:00 2 [IU] via SUBCUTANEOUS
  Administered 2019-05-07: 3 [IU] via SUBCUTANEOUS
  Administered 2019-05-07: 2 [IU] via SUBCUTANEOUS
  Administered 2019-05-07 (×2): 3 [IU] via SUBCUTANEOUS
  Administered 2019-05-07 – 2019-05-08 (×2): 5 [IU] via SUBCUTANEOUS
  Administered 2019-05-08: 04:00:00 3 [IU] via SUBCUTANEOUS
  Administered 2019-05-08: 18:00:00 15 [IU] via SUBCUTANEOUS
  Administered 2019-05-08: 5 [IU] via SUBCUTANEOUS
  Administered 2019-05-08: 2 [IU] via SUBCUTANEOUS
  Administered 2019-05-08: 12:00:00 8 [IU] via SUBCUTANEOUS
  Administered 2019-05-09 (×2): 3 [IU] via SUBCUTANEOUS
  Administered 2019-05-09: 08:00:00 2 [IU] via SUBCUTANEOUS
  Administered 2019-05-09 (×2): 3 [IU] via SUBCUTANEOUS
  Administered 2019-05-09: 5 [IU] via SUBCUTANEOUS
  Administered 2019-05-10: 11 [IU] via SUBCUTANEOUS
  Administered 2019-05-10: 3 [IU] via SUBCUTANEOUS
  Administered 2019-05-10: 5 [IU] via SUBCUTANEOUS
  Administered 2019-05-10 – 2019-05-11 (×2): 8 [IU] via SUBCUTANEOUS
  Administered 2019-05-11: 3 [IU] via SUBCUTANEOUS
  Administered 2019-05-11 (×3): 2 [IU] via SUBCUTANEOUS
  Administered 2019-05-11: 5 [IU] via SUBCUTANEOUS
  Administered 2019-05-12 (×3): 3 [IU] via SUBCUTANEOUS
  Administered 2019-05-12: 5 [IU] via SUBCUTANEOUS
  Administered 2019-05-12: 3 [IU] via SUBCUTANEOUS
  Administered 2019-05-12: 09:00:00 2 [IU] via SUBCUTANEOUS
  Administered 2019-05-13: 22:00:00 11 [IU] via SUBCUTANEOUS
  Administered 2019-05-13: 13:00:00 3 [IU] via SUBCUTANEOUS
  Administered 2019-05-13: 05:00:00 2 [IU] via SUBCUTANEOUS
  Administered 2019-05-14: 11 [IU] via SUBCUTANEOUS
  Administered 2019-05-14: 21:00:00 5 [IU] via SUBCUTANEOUS
  Administered 2019-05-14: 04:00:00 8 [IU] via SUBCUTANEOUS
  Administered 2019-05-14 – 2019-05-15 (×3): 2 [IU] via SUBCUTANEOUS
  Administered 2019-05-15 (×3): 5 [IU] via SUBCUTANEOUS

## 2019-05-02 MED ORDER — INSULIN GLARGINE 100 UNIT/ML ~~LOC~~ SOLN
15.0000 [IU] | Freq: Two times a day (BID) | SUBCUTANEOUS | Status: DC
  Administered 2019-05-03 (×2): 15 [IU] via SUBCUTANEOUS
  Filled 2019-05-02: qty 0.15

## 2019-05-02 NOTE — Plan of Care (Signed)

## 2019-05-02 NOTE — Progress Notes (Signed)
NAME:  Robert [name], MRN:  [id number], DOB:  1951-06-06, LOS: 59 ADMISSION DATE:  04/11/2019, CONSULTATION DATE:  8/1 REFERRING MD:  Roderic Palau, CHIEF COMPLAINT:  Dyspnea   Brief History   68 year old male admitted on 8/1 in the setting of ARDS from COVID-19 pneumonia. Initially presented to [facility] on 7/31 and required intubation. Post-procedure in the ED, patient awakened and self-extubated and required re-intubation. At [facility], tolerated PS and subsequently extubated on 8/2 however required intubation within 24 hours. Hospital course complicated by septic shock 2/2 MRSA pneumonia and volume overload.   Past Medical History  Anxiety COPD GERD CAD Hypertension Hyperlipidemia Rheumatoid arthritis History of systolic heart failure, however 08/2018 Echo showed LVEF 50-55%, left atrium dilated  Significant Hospital Events   8/1 admission to [facility] ICU 8/2 extubated 8/3 early AM agitated, placed on BIPAP, required re-intubation 8/4 started on steroids for COPD exacerbation 8/8 septic shock requiring pressor support 8/9 off pressors, respiratory cultures +s.aureus 8/10 persistent encephalopathy off sedation, CT head ordered 8/11 encephalopathy improved 8/12 difficulty weaning due to tachypnea, ETT exchanged due to dried blood clot burden 8/13 Bleeding from subclavian CVL.  Exchanged for PICC line 8/17 started weaning on PSV 14/5 8/22 extubated  Consults:  PCCM  Procedures:  8/1 ETT >8/2, 8/3 > 8/22 8/1 L subclavian CVL >8/13 8/13 Rt PICC  Significant Diagnostic Tests:  08/2018 Echo> LVEF 50-55%, left atrium dilated  CT head 8/11- mild age-related atrophy and chronic microvascular ischemic changes.  Old lacunar infarct.  Micro Data:  7/31 blood >NGTD 7/31 sars-cov-2 > positive 8/1 Resp CX > Normal flora 8/7 Resp CX > MRSA 8/7 BCX > NGTD 8/8 UCX > NGTD 8/15 Resp culture>  MRSA   Antimicrobials:  Cefepime 7/31 > 8/4  Flagyl 7/31  Remdesivir 8/1 > 8/5  Actemra 8/1   Decadron/solumedrol 8/1 >8/8  Zosyn 8/8>8/9 Azithro 8/7>8/9   Cefepime 8/14 >  Vanc 7/31, 8/8>    Interim history/subjective:   Weaned on pressure support 5/5 for 8 hours yesterday  Objective   Blood pressure 128/66, pulse 83, temperature 98.8 F (37.1 C), temperature source Axillary, resp. rate 20, height 6' (1.829 m), weight 107.7 kg, SpO2 99 %. CVP:  [8 mmHg-11 mmHg] 11 mmHg  Vent Mode: PRVC FiO2 (%):  [30 %] 30 % Set Rate:  [26 bmp] 26 bmp Vt Set:  [460 mL] 460 mL PEEP:  [5 cmH20] 5 cmH20 Pressure Support:  [10 cmH20] 10 cmH20 Plateau Pressure:  [14 cmH20-23 cmH20] 14 cmH20   Intake/Output Summary (Last 24 hours) at 05/02/2019 0802 Last data filed at 05/02/2019 0700 Gross per 24 hour  Intake 5323.98 ml  Output 2169 ml  Net 3154.98 ml   Filed Weights   04/30/19 0400 05/01/19 0400 05/02/19 0500  Weight: 104.2 kg 106.5 kg 107.7 kg    General:  In bed on vent HENT: NCAT ETT in place PULM: CTA B, vent supported breathing CV: RRR, no mgr GI: BS+, soft, nontender MSK: normal bulk and tone Neuro: awake, following commands  8/14 CXR> ETT in place, lungs fairly clear compared to admission   Resolved Hospital Problem list     Assessment & Plan:  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: improving Generalized weakness contributing to inability to wean from vent, slowly improving extutbate today Monitor carefully in ICU Aspiration precautions Ice chips OK  MRSA pneumonia Complete vancomycin today  COPD exacerbation: resolved As needed albuterol after extubation (HFA)  Need for sedation/acute encephalopathy: resolved Stop sedation protocol  Best practice:  Diet: Tube feeding Pain/Anxiety/Delirium protocol (if indicated): n/a VAP protocol (if indicated): n/a DVT prophylaxis: Lovenox GI prophylaxis: Pantoprazole for stress ulcer prophylaxis Glucose control: SSI Mobility: bed rest Code Status: full Family Communication:  Disposition: remain in ICU  Labs    CBC: Recent Labs  Lab 04/28/19 0440 04/29/19 0500 04/30/19 0350 05/01/19 0445 05/02/19 0510  WBC 7.0 10.2 9.0 7.5 6.5  NEUTROABS 5.3 8.5* 7.7 6.0 5.0  HGB 9.7* 10.5* 9.9* 8.8* 8.9*  HCT 32.1* 35.1* 33.0* 28.8* 28.7*  MCV 99.1 100.3* 100.6* 99.0 98.0  PLT 126* 131* 108* 106* 98*    Basic Metabolic Panel: Recent Labs  Lab 04/28/19 0440  04/29/19 0500 04/30/19 0350 04/30/19 1200 05/01/19 0445 05/02/19 0510  NA 154*   < > 151* 150* 151* 148* 150*  K 4.7   < > 4.8 5.2* 4.6 4.3 4.5  CL 121*   < > 120* 121* 123* 118* 121*  CO2 27   < > 26 26 23 26 25   GLUCOSE 226*   < > 183* 228* 199* 162* 113*  BUN 71*   < > 64* 61* 60* 60* 62*  CREATININE 1.31*   < > 1.18 1.25* 1.16 1.18 1.27*  CALCIUM 8.3*   < > 8.6* 8.6* 8.3* 8.2* 8.5*  MG 2.4  --  2.3 2.2  --  2.3 2.2   < > = values in this interval not displayed.   GFR: Estimated Creatinine Clearance: 72.5 mL/min (A) (by C-G formula based on SCr of 1.27 mg/dL (H)). Recent Labs  Lab 04/26/19 0520  04/27/19 0610  04/29/19 0500 04/30/19 0350 05/01/19 0445 05/02/19 0510  PROCALCITON 0.50  --  0.26  --   --   --   --   --   WBC 6.4   < > 7.0   < > 10.2 9.0 7.5 6.5   < > = values in this interval not displayed.    Liver Function Tests: Recent Labs  Lab 04/28/19 0440 04/29/19 0500 04/30/19 0350 05/01/19 0445 05/02/19 0510  AST 60* 60* 45* 47* 47*  ALT 117* 115* 96* 90* 80*  ALKPHOS 72 78 79 84 83  BILITOT 0.4 0.3 0.5 0.3 0.2*  PROT 5.5* 5.8* 5.7* 5.3* 5.5*  ALBUMIN 2.2* 2.3* 2.2* 2.1* 2.0*   No results for input(s): LIPASE, AMYLASE in the last 168 hours. No results for input(s): AMMONIA in the last 168 hours.  ABG    Component Value Date/Time   PHART 7.412 04/24/2019 0424   PCO2ART 36.9 04/24/2019 0424   PO2ART 84.0 04/24/2019 0424   HCO3 23.2 04/24/2019 0424   TCO2 24 04/24/2019 0424   ACIDBASEDEF 1.0 04/24/2019 0424   O2SAT 95.0 04/24/2019 0424     Coagulation Profile: No results for input(s): INR, PROTIME  in the last 168 hours.  Cardiac Enzymes: No results for input(s): CKTOTAL, CKMB, CKMBINDEX, TROPONINI in the last 168 hours.  HbA1C: Hgb A1c MFr Bld  Date/Time Value Ref Range Status  04/11/2019 09:50 PM 7.4 (H) 4.8 - 5.6 % Final    Comment:    (NOTE) Pre diabetes:          5.7%-6.4% Diabetes:              >6.4% Glycemic control for   <7.0% adults with diabetes   06/07/2017 12:02 AM 9.1 (H) 4.8 - 5.6 % Final    Comment:    (NOTE) Pre diabetes:  5.7%-6.4% Diabetes:              >6.4% Glycemic control for   <7.0% adults with diabetes     CBG: Recent Labs  Lab 05/01/19 1142 05/01/19 1535 05/01/19 1938 05/02/19 0008 05/02/19 0356  GLUCAP 147* 111* 86 99 100*     Critical care time: 35 minutes      Roselie Awkward, MD [facility] Pager: [phone] Cell: [phone] If no response, call [phone]

## 2019-05-02 NOTE — Progress Notes (Signed)
PROGRESS NOTE  Robert [name]:[id number] DOB: [birth year] DOA: 04/11/2019 PCP: Redmond School, MD   LOS: 20 days   Brief Narrative / Interim history: 68 year old male with history of COPD, CAD, diabetes mellitus, ischemic cardiomyopathy, RA, presented to the hospital and was admitted on 04/11/2019 with shortness of breath, he was found to be significantly hypoxic, tested positive for COVID-19, was intubated and transferred to [facility] for further management.  Significant events: 8/1 admission to [facility] ICU 8/2 extubated 8/3 early AM agitated, placed on BIPAP, required re-intubation 8/4 started on steroids for COPD exacerbation 8/8 septic shock requiring pressor support 8/9 off pressors, respiratory cultures +s.aureus 8/10 persistent encephalopathy off sedation, CT head ordered 8/11 encephalopathy improved 8/12 difficulty weaning due to tachypnea, ETT exchanged due to clot burden 8/13 PICC line inserted and subclavian central line removed 8/14 Antibiotics coverage broaden due to persistent fever and resp distress, pan cultured  8/19 more alert, tolerating pressure support  Subjective: Awake this morning, on pressure support, appears comfortable  Assessment & Plan: Principal Problem:   Pneumonia due to COVID-19 virus Active Problems:   Rheumatoid arthritis ([place])   Hyperlipidemia   COPD (chronic obstructive pulmonary disease) ([place])   Controlled type 2 diabetes mellitus with diabetic peripheral angiopathy without gangrene, with long-term current use of insulin (HCC)   Essential hypertension   Ischemic cardiomyopathy   Coronary artery disease   Dyslipidemia (high LDL; low HDL)   COVID-19 virus detected   Acute respiratory distress syndrome (ARDS) due to COVID-19 virus   Atrial fibrillation with RVR (HCC)   COPD (chronic obstructive pulmonary disease) with emphysema (HCC)   AKI (acute kidney injury) ([place])   Diabetes mellitus type 2, uncontrolled, with complications (HCC)   Pressure  injury of skin   Staphylococcal pneumonia ([place])   HCAP (healthcare-associated pneumonia)   Principal Problem Acute Hypoxic Respiratory Failure due to Covid-19 Viral Illness, COPD exacerbation -Pressure support this morning, weaning trials daily.  Seems to be improving  Vent Mode: PSV;CPAP FiO2 (%):  [30 %] 30 % Set Rate:  [26 bmp] 26 bmp Vt Set:  [460 mL] 460 mL PEEP:  [5 cmH20] 5 cmH20 Pressure Support:  [8 cmH20] 8 cmH20 Plateau Pressure:  [14 cmH20-23 cmH20] 23 cmH20   Fever: afebrile  Temp (24hrs), Avg:98.6 F (37 C), Min:98.3 F (36.8 C), Max:98.9 F (37.2 C)  Antibiotics:  Cefepime 7/31 >8/4  Flagyl 7/31  Remdesivir 8/1 >8/5  Actemra 8/1  Decadron/solumedrol 8/1 >8/8  Zosyn 8/8>8/9 Azithro 8/7>8/9 Cefepime 8/14 >8/19 Vanc 7/31, 8/8>  Remdesivir: 8/1 >> 8/5 Steroids: decadron / solumedrol 8/1 >> 8/5 Diuretics: 8/1 >> 8/5 Actemra: 8/1 Convalescent Plasma: none  Vitamin C and Zinc: yes   COVID-19 Labs  Recent Labs    04/30/19 0350 05/01/19 0445 05/02/19 0510 05/02/19 0511  DDIMER 2.62* 2.52* 2.16*  --   CRP 1.1* 1.1*  --  1.1*    Lab Results  Component Value Date   SARSCOV2NAA POSITIVE (A) 04/11/2019   SARSCOV2NAA Detected (A) 04/11/2019    Active Problems MRSA HCAP /septic shock -Off pressors now -Currently on vancomycin, afebrile, plan to end vancomycin on 8/22 which would give him a total of 3 weeks.  Afebrile  Paroxysmal A. fib with RVR -Currently in sinus, on metoprolol 25 3 times daily -He was on IV heparin but no anticoagulation is on hold due to bleeding with ETT clotting as well as bleeding from the subclavian line -Continue heparin prophylaxis, eventually will need to go back on anticoagulation  Acute on chronic diastolic CHF, essential hypertension, history of CAD -History of PCI in 2018 with in-stent restenosis, currently on aspirin and DVT prophylaxis and his Plavix is on hold as there are concerns that the patient may need  tracheostomy -Initially diuresed now on hold due to hypernatremia -Looks a bit fluid up today, he is net +20 L, start Lasix  Hypernatremia -Sodium 151, closely monitor while on Lasix.  He is receiving free water  Acute kidney injury -Creatinine peaked as high as 2.39 on 04/24/2019, possibly multifactorial in the setting of diuresis and use of vancomycin -He was also found to have retention on 8/13 requiring Foley catheter placement -Failed voiding trial on 8/20 and Foley was placed back.  Continue Flomax  ETT clotting/bleeding from the subclavian line -Patient was on therapeutic heparin, ET tube was clotted and exchange on 04/23/2019.  Currently on heparin prophylaxis -Hemoglobin overall stable, continue to monitor  Thrombocytopenia -Chronic, stable  Type 2 diabetes mellitus, uncontrolled with hyperglycemia with vascular disease and neuropathy at baseline -Continue Lantus, tube feed coverage, sliding scale.  Continue gabapentin.  CBGs stable  CBG (last 3)  Recent Labs    05/02/19 0008 05/02/19 0356 05/02/19 0801  GLUCAP 99 100* 103*   RA -He is on Actemra and methotrexate at home, currently on hold  Pressure ulcer of medial sacrum bilateral buttocks Pressure Injury 04/17/19 Sacrum Medial Deep Tissue Injury - Purple or maroon localized area of discolored intact skin or blood-filled blister due to damage of underlying soft tissue from pressure and/or shear. dark purple with some skin sloughing on lowe (Active)  04/17/19 0730  Location: Sacrum  Location Orientation: Medial  Staging: Deep Tissue Injury - Purple or maroon localized area of discolored intact skin or blood-filled blister due to damage of underlying soft tissue from pressure and/or shear.  Wound Description (Comments): dark purple with some skin sloughing on lower sacral area  Present on Admission: No     Pressure Injury 04/17/19 Buttocks Right;Lower Unstageable - Full thickness tissue loss in which the base of the  ulcer is covered by slough (yellow, tan, gray, green or brown) and/or eschar (tan, brown or black) in the wound bed. (Active)  04/17/19 0730  Location: Buttocks  Location Orientation: Right;Lower  Staging: Unstageable - Full thickness tissue loss in which the base of the ulcer is covered by slough (yellow, tan, gray, green or brown) and/or eschar (tan, brown or black) in the wound bed.  Wound Description (Comments):   Present on Admission: No     Pressure Injury 04/17/19 Buttocks Left;Lower Deep Tissue Injury - Purple or maroon localized area of discolored intact skin or blood-filled blister due to damage of underlying soft tissue from pressure and/or shear. (Active)  04/17/19 0730  Location: Buttocks  Location Orientation: Left;Lower  Staging: Deep Tissue Injury - Purple or maroon localized area of discolored intact skin or blood-filled blister due to damage of underlying soft tissue from pressure and/or shear.  Wound Description (Comments):   Present on Admission: No        Scheduled Meds:  arformoterol  15 mcg Nebulization BID   aspirin  81 mg Per Tube Daily   bethanechol  10 mg Per Tube Daily   budesonide (PULMICORT) nebulizer solution  0.5 mg Nebulization BID   chlorhexidine gluconate (MEDLINE KIT)  15 mL Mouth Rinse BID   Chlorhexidine Gluconate Cloth  6 each Topical Daily   feeding supplement (PRO-STAT SUGAR FREE 64)  30 mL Per Tube TID   folic  acid  1 mg Per Tube Daily   free water  200 mL Per Tube Q4H   furosemide  40 mg Intravenous BID   gabapentin  100 mg Per Tube Q8H   heparin injection (subcutaneous)  7,500 Units Subcutaneous Q8H   insulin aspart  0-20 Units Subcutaneous Q4H   insulin aspart  10 Units Subcutaneous Q4H   insulin glargine  35 Units Subcutaneous BID   mouth rinse  15 mL Mouth Rinse 10 times per day   metoprolol tartrate  25 mg Per Tube TID   multivitamin  15 mL Per Tube Daily   pantoprazole sodium  40 mg Per Tube Daily   sodium  chloride flush  10-40 mL Intracatheter Q12H   tamsulosin  0.4 mg Oral QPC supper   traZODone  50 mg Oral QHS   vitamin C  500 mg Per Tube Daily   zinc sulfate  220 mg Per Tube Daily   Continuous Infusions:  sodium chloride 10 mL/hr at 05/02/19 0700   feeding supplement (VITAL AF 1.2 CAL) 1,000 mL (05/01/19 2100)   vancomycin Stopped (05/02/19 0053)   PRN Meds:.sodium chloride, acetaminophen, dextrose, fentaNYL (SUBLIMAZE) injection, hydrALAZINE, HYDROmorphone, ipratropium-albuterol, sodium chloride flush, Thrombi-Pad   DVT prophylaxis: heparin Code Status: Full code Family Communication: Per PCCM Disposition Plan: remain in ICU   Consultants:   PCCM  Procedures:   None   Micro data: 7/31 blood >NGTD 7/31 sars-cov-2 >positive 8/1 Resp CX > Normal flora 8/7 Resp CX >MRSA 8/7 BCX > NGTD 8/8 UCX > NGTD 8/15 Resp culture>  MRSA    Objective: Vitals:   05/02/19 0700 05/02/19 0737 05/02/19 0745 05/02/19 0800  BP:  131/68  130/88  Pulse: 83 84  79  Resp: 20 (!) 32  (!) 30  Temp:    98.5 F (36.9 C)  TempSrc:    Axillary  SpO2: 100% 99% 98% 97%  Weight:      Height:        Intake/Output Summary (Last 24 hours) at 05/02/2019 1127 Last data filed at 05/02/2019 0900 Gross per 24 hour  Intake 5569.03 ml  Output 2324 ml  Net 3245.03 ml   Filed Weights   04/30/19 0400 05/01/19 0400 05/02/19 0500  Weight: 104.2 kg 106.5 kg 107.7 kg    Examination:  Constitutional: awake on the vent, breathing comfortably Eyes: No scleral icterus ENMT: mmm Neck: normal, supple Respiratory: Clear, no wheezing, no crackles, moves air well Cardiovascular: Regular rate and rhythm, no murmurs.  No edema.  Mild swelling on bilateral hands  Abdomen: Positive bowel sounds Musculoskeletal: no clubbing / cyanosis. Skin: No rashes appreciated Neurologic: Follows commands, appears equal  Data Reviewed: I have independently reviewed following labs and imaging studies     CBC: Recent Labs  Lab 04/28/19 0440 04/29/19 0500 04/30/19 0350 05/01/19 0445 05/02/19 0510  WBC 7.0 10.2 9.0 7.5 6.5  NEUTROABS 5.3 8.5* 7.7 6.0 5.0  HGB 9.7* 10.5* 9.9* 8.8* 8.9*  HCT 32.1* 35.1* 33.0* 28.8* 28.7*  MCV 99.1 100.3* 100.6* 99.0 98.0  PLT 126* 131* 108* 106* 98*   Basic Metabolic Panel: Recent Labs  Lab 04/28/19 0440  04/29/19 0500 04/30/19 0350 04/30/19 1200 05/01/19 0445 05/02/19 0510  NA 154*   < > 151* 150* 151* 148* 150*  K 4.7   < > 4.8 5.2* 4.6 4.3 4.5  CL 121*   < > 120* 121* 123* 118* 121*  CO2 27   < > 26 26 23 26  25  GLUCOSE 226*   < > 183* 228* 199* 162* 113*  BUN 71*   < > 64* 61* 60* 60* 62*  CREATININE 1.31*   < > 1.18 1.25* 1.16 1.18 1.27*  CALCIUM 8.3*   < > 8.6* 8.6* 8.3* 8.2* 8.5*  MG 2.4  --  2.3 2.2  --  2.3 2.2   < > = values in this interval not displayed.   GFR: Estimated Creatinine Clearance: 72.5 mL/min (A) (by C-G formula based on SCr of 1.27 mg/dL (H)). Liver Function Tests: Recent Labs  Lab 04/28/19 0440 04/29/19 0500 04/30/19 0350 05/01/19 0445 05/02/19 0510  AST 60* 60* 45* 47* 47*  ALT 117* 115* 96* 90* 80*  ALKPHOS 72 78 79 84 83  BILITOT 0.4 0.3 0.5 0.3 0.2*  PROT 5.5* 5.8* 5.7* 5.3* 5.5*  ALBUMIN 2.2* 2.3* 2.2* 2.1* 2.0*   No results for input(s): LIPASE, AMYLASE in the last 168 hours. No results for input(s): AMMONIA in the last 168 hours. Coagulation Profile: No results for input(s): INR, PROTIME in the last 168 hours. Cardiac Enzymes: No results for input(s): CKTOTAL, CKMB, CKMBINDEX, TROPONINI in the last 168 hours. BNP (last 3 results) No results for input(s): PROBNP in the last 8760 hours. HbA1C: No results for input(s): HGBA1C in the last 72 hours. CBG: Recent Labs  Lab 05/01/19 1535 05/01/19 1938 05/02/19 0008 05/02/19 0356 05/02/19 0801  GLUCAP 111* 86 99 100* 103*   Lipid Profile: No results for input(s): CHOL, HDL, LDLCALC, TRIG, CHOLHDL, LDLDIRECT in the last 72  hours. Thyroid Function Tests: No results for input(s): TSH, T4TOTAL, FREET4, T3FREE, THYROIDAB in the last 72 hours. Anemia Panel: No results for input(s): VITAMINB12, FOLATE, FERRITIN, TIBC, IRON, RETICCTPCT in the last 72 hours. Urine analysis:    Component Value Date/Time   COLORURINE YELLOW 04/25/2019 0826   APPEARANCEUR HAZY (A) 04/25/2019 0826   LABSPEC 1.015 04/25/2019 0826   PHURINE 5.0 04/25/2019 0826   GLUCOSEU 150 (A) 04/25/2019 0826   HGBUR LARGE (A) 04/25/2019 0826   BILIRUBINUR NEGATIVE 04/25/2019 0826   KETONESUR NEGATIVE 04/25/2019 0826   PROTEINUR 30 (A) 04/25/2019 0826   UROBILINOGEN 0.2 12/22/2011 2130   NITRITE NEGATIVE 04/25/2019 0826   LEUKOCYTESUR NEGATIVE 04/25/2019 0826   Sepsis Labs: Invalid input(s): PROCALCITONIN, LACTICIDVEN  Recent Results (from the past 240 hour(s))  Culture, blood (routine x 2)     Status: None   Collection Time: 04/25/19  9:40 AM   Specimen: BLOOD LEFT HAND  Result Value Ref Range Status   Specimen Description   Final    BLOOD LEFT HAND Performed at [facility] [address] [place], [place] [address]    Special Requests   Final    BOTTLES DRAWN AEROBIC ONLY Blood Culture results may not be optimal due to an inadequate volume of blood received in culture bottles Performed at [facility] [address], [place], [place] [address]    Culture   Final    NO GROWTH 5 DAYS Performed at [facility], [place] [address] [place], [place] [address]    Report Status 04/30/2019 FINAL  Final  Culture, blood (routine x 2)     Status: None   Collection Time: 04/25/19  9:45 AM   Specimen: BLOOD RIGHT HAND  Result Value Ref Range Status   Specimen Description   Final    BLOOD RIGHT HAND Performed at [facility] [address], [place], [place] [address]  Special Requests   Final    BOTTLES DRAWN AEROBIC ONLY Blood Culture adequate volume Performed at [facility] [address] [place], [place] [address]    Culture   Final    NO GROWTH 5 DAYS Performed at [facility], [place] [address], [place], [place] [address]    Report Status 04/30/2019 FINAL  Final  Culture, respiratory (non-expectorated)     Status: None   Collection Time: 04/26/19  8:46 AM   Specimen: Tracheal Aspirate; Respiratory  Result Value Ref Range Status   Specimen Description   Final    TRACHEAL ASPIRATE Performed at [facility] [address] [place], [place] [address]    Special Requests   Final    NONE Performed at [facility], [place] [address] [place], [place] [address]    Gram Stain   Final    FEW WBC PRESENT, PREDOMINANTLY PMN FEW GRAM POSITIVE COCCI IN CLUSTERS FEW GRAM NEGATIVE RODS RARE GRAM POSITIVE RODS RARE YEAST Performed at [facility], [place] [address] [place],  [address]    Culture   Final    ABUNDANT METHICILLIN RESISTANT STAPHYLOCOCCUS AUREUS   Report Status 04/29/2019 FINAL  Final   Organism ID, Bacteria METHICILLIN RESISTANT STAPHYLOCOCCUS AUREUS  Final      Susceptibility   Methicillin resistant staphylococcus aureus - MIC*    CIPROFLOXACIN <=0.5 SENSITIVE Sensitive     ERYTHROMYCIN >=8 RESISTANT Resistant     GENTAMICIN <=0.5 SENSITIVE Sensitive     OXACILLIN >=4 RESISTANT Resistant     TETRACYCLINE <=1 SENSITIVE Sensitive     VANCOMYCIN 1 SENSITIVE Sensitive     TRIMETH/SULFA <=10 SENSITIVE Sensitive     CLINDAMYCIN <=0.25 SENSITIVE Sensitive     RIFAMPIN <=0.5 SENSITIVE Sensitive     Inducible Clindamycin NEGATIVE Sensitive     * ABUNDANT METHICILLIN RESISTANT STAPHYLOCOCCUS AUREUS      Radiology Studies: No results found. Marzetta Board, MD, PhD [facility]  Contact via  [email or web address]  [place] P: [phone] F: [phone]

## 2019-05-02 NOTE — Evaluation (Signed)
Physical Therapy Evaluation Patient Details Name: Robert [name] MRN: [id number] DOB: 1950/12/30 Today's Date: 05/02/2019   History of Present Illness  68 yo male [place] HTN,/CAD,GERD, DM,RA, T3 compression fx, wrist fx, COPD,ischemic myopathy presented to the hospital and was admitted on 04/11/2019 with shortness of breath, he was found to be significantly hypoxic, tested positive for COVID-19, was intubated and transferred to [facility] for further management  Clinical Impression  PT eval limited today to [facility], remains on vent. Patient was awake most of session and did assist  With [facility] for all extremities. Patient is being weaned from ventilatorso should be able to begin increasing mobility . Pt admitted with above diagnosis.  Pt currently with functional limitations due to the deficits listed below (see PT Problem List). Pt will benefit from skilled PT to increase their independence and safety with mobility to allow discharge to the venue listed below.   Patient on 30%, 8 Peep, SPO2 100%,    Follow Up Recommendations SNF;CIR- TBD as pt. progresses    Equipment Recommendations  (tbd)    Recommendations for Other Services   OT    Precautions / Restrictions Precautions Precautions: Fall Precaution Comments: on vent, significant  scrotal edema Restrictions Weight Bearing Restrictions: No      Mobility  Bed Mobility Overal bed mobility: Needs Assistance Bed Mobility: Rolling Rolling: +2 for physical assistance;+2 for safety/equipment         General bed mobility comments: total assist of 2 for repositioning, leaning to the right  Transfers                    Ambulation/Gait                Stairs            Wheelchair Mobility    Modified Rankin (Stroke Patients Only)       Balance                                             Pertinent Vitals/Pain Pain Assessment: No/denies pain    Home Living Family/patient expects to be  discharged to:: Private residence Living Arrangements: Spouse/significant other;Children(2 dogs) Available Help at Discharge: Family Type of Home: House Home Access: Stairs to enter Entrance Stairs-Rails: Left Entrance Stairs-Number of Steps: 3 Home Layout: Multi-level;Able to live on main level with bedroom/bathroom Home Equipment: Kasandra Knudsen - single point Additional Comments: info  from previous encounter    Prior Function Level of Independence: Independent               Hand Dominance        Extremity/Trunk Assessment   Upper Extremity Assessment Upper Extremity Assessment: RUE deficits/detail;LUE deficits/detail RUE Deficits / Details: very edematous, has movement all muscle groups grossly, unable to test  shoulder due to ETT tube and position in bed LUE Deficits / Details: grossly ROM WFL, weak movements in all major muscle groups, sign. edema    Lower Extremity Assessment Lower Extremity Assessment: RLE deficits/detail;LLE deficits/detail RLE Deficits / Details: active movements, very weak , decreasd hip internal rotation. LLE Deficits / Details: similar to right in active movement, no hip tightness    Cervical / Trunk Assessment Cervical / Trunk Assessment: Other exceptions Cervical / Trunk Exceptions: head more rotated to right but able to turn towards the left. ETT tube to the right  Communication  Communication: (ETT tube)  Cognition Arousal/Alertness: Awake/alert Behavior During Therapy: WFL for tasks assessed/performed Overall Cognitive Status: Difficult to assess                                 General Comments: VEnt, nods head, gave 2 fingers for how many dogs and thumbs up when told this PT has a dog,nodded to being in hospital, no other info obtained      General Comments      Exercises General Exercises - Upper Extremity Shoulder Flexion: PROM;AAROM;Both;10 reps(right limited by ETT) Shoulder ABduction: PROM;Left;10 reps Elbow  Flexion: AAROM;Both;10 reps Elbow Extension: AAROM;Both;10 reps Wrist Flexion: AAROM;Both;10 reps Wrist Extension: 10 reps;AAROM;Both General Exercises - Lower Extremity Ankle Circles/Pumps: AAROM;Both;10 reps Heel Slides: AAROM;Both;10 reps;PROM Hip ABduction/ADduction: AAROM;PROM;Both;10 reps   Assessment/Plan    PT Assessment Patient needs continued PT services  PT Problem List Decreased strength;Decreased mobility;Decreased range of motion;Decreased knowledge of precautions;Decreased coordination;Decreased activity tolerance;Decreased cognition;Cardiopulmonary status limiting activity;Decreased balance;Decreased knowledge of use of DME       PT Treatment Interventions Therapeutic activities;Therapeutic exercise;Patient/family education;Functional mobility training    PT Goals (Current goals can be found in the Care Plan section)  Acute Rehab PT Goals Patient Stated Goal: pt. nodded to exercises PT Goal Formulation: With patient Time For Goal Achievement: 05/16/19 Potential to Achieve Goals: Fair    Frequency Min 2X/week   Barriers to discharge        Co-evaluation               AM-PAC PT "6 Clicks" Mobility  Outcome Measure Help needed turning from your back to your side while in a flat bed without using bedrails?: Total Help needed moving from lying on your back to sitting on the side of a flat bed without using bedrails?: Total Help needed moving to and from a bed to a chair (including a wheelchair)?: Total Help needed standing up from a chair using your arms (e.g., wheelchair or bedside chair)?: Total Help needed to walk in hospital room?: Total Help needed climbing 3-5 steps with a railing? : Total 6 Click Score: 6    End of Session   Activity Tolerance: Patient tolerated treatment well Patient left: in bed;with call bell/phone within reach;with nursing/sitter in room Nurse Communication: Mobility status;Need for lift equipment PT Visit Diagnosis: Muscle  weakness (generalized) (M62.81);Difficulty in walking, not elsewhere classified (R26.2)    Time: 0821-0850 PT Time Calculation (min) (ACUTE ONLY): 29 min   Charges:   PT Evaluation $PT Eval Moderate Complexity: 1 Mod PT Treatments $Therapeutic Exercise: 8-22 mins        Tresa Endo PT Acute Rehabilitation Services  Office [phone]   Claretha Cooper 05/02/2019, 2:25 PM

## 2019-05-02 NOTE — Progress Notes (Signed)
Pharmacy Antibiotic Note  Robert [name] is a 68 y.o. male with PMH of COPD, CAD, DM, ischemic cardiomyopathy, RA who was admitted on 04/11/2019 with COVID-19 pneumonia. Pt developed an MRSA pneumonia being treated with vancomycin. Due to persistent fevers, gram negative coverage will be added with cefepime. Pharmacy has been consulted for cefepime and to continue vancomycin (beyond day 7 for MRSA pneumonia) dosing for sepsis.   Today is D#14 of vancomycin. WBC remains wnl. Afebrile. SCr has bumped up slightly.   Plan:  Continue vancomycin 1500 mg IV Q 24 hours. Today is last dose   Goal AUC = 400 - 550.  Est AUC 472 using SCr 1.18  Monitor clinical picture, renal function, WBCs, and cultures.  Height: 6' (182.9 cm) Weight: 237 lb 7 oz (107.7 kg) IBW/kg (Calculated) : 77.6  Temp (24hrs), Avg:98.6 F (37 C), Min:98.3 F (36.8 C), Max:98.9 F (37.2 C)  Recent Labs  Lab 04/28/19 0440  04/29/19 0500 04/30/19 0350 04/30/19 1200 05/01/19 0445 05/02/19 0510  WBC 7.0  --  10.2 9.0  --  7.5 6.5  CREATININE 1.31*   < > 1.18 1.25* 1.16 1.18 1.27*   < > = values in this interval not displayed.    Estimated Creatinine Clearance: 72.5 mL/min (A) (by C-G formula based on SCr of 1.27 mg/dL (H)).    Allergies  Allergen Reactions  . Ivp Dye [Iodinated Diagnostic Agents] Itching and Rash   Antimicrobials this admission: 7/31 vancomycin >> 8/1, resumed 8/8 >> 8/21 7/31 cefepime >> 8/5, resumed 8/14>> 7/31 metronidazole x1 8/1 Remdesivir >> 8/5 8/1 Actemra (PTA tocilizumab 162mg  SQ weekly on Mondays for RA)  8/7 Azithro >> 8/9 8/8 Zosyn >> 8/10  Dose adjustments this admission: 8/11 empiric renal adjustment. 8/12 VT - 36; Hold vanc  8/14 VR -45; Vanc x1  Microbiology results: 7/31 [facility] x2: NGF 7/31 UCx: NGF 7/31 SARS CoV-2: positive 8/1 Ta: rare normal flora 8/1 MRSA PCR: neg 8/7 [id number]  8/7 BCx: NGF 8/8 UCx: NGF 8/14 Bcx: negF 8/14 UCx: NGF 8/15 TA:   abundant MRSA    Thank you for allowing pharmacy to be a part of this patient's care.  Albertina Parr, PharmD., BCPS Clinical Pharmacist Clinical phone for 05/02/19 until 5pm: [phone]

## 2019-05-02 NOTE — Plan of Care (Signed)
Discussed POC with pt, Pt nods in agreement with current POC.

## 2019-05-03 LAB — COMPREHENSIVE METABOLIC PANEL
ALT: 79 U/L — ABNORMAL HIGH (ref 0–44)
AST: 47 U/L — ABNORMAL HIGH (ref 15–41)
Albumin: 2.1 g/dL — ABNORMAL LOW (ref 3.5–5.0)
Alkaline Phosphatase: 92 U/L (ref 38–126)
Anion gap: 7 (ref 5–15)
BUN: 65 mg/dL — ABNORMAL HIGH (ref 8–23)
CO2: 25 mmol/L (ref 22–32)
Calcium: 8.7 mg/dL — ABNORMAL LOW (ref 8.9–10.3)
Chloride: 118 mmol/L — ABNORMAL HIGH (ref 98–111)
Creatinine, Ser: 1.28 mg/dL — ABNORMAL HIGH (ref 0.61–1.24)
GFR calc Af Amer: >60 mL/min (ref >60)
GFR calc non Af Amer: 58 mL/min — ABNORMAL LOW (ref >60)
Glucose, Bld: 108 mg/dL — ABNORMAL HIGH (ref 70–99)
Potassium: 4.4 mmol/L (ref 3.5–5.1)
Sodium: 150 mmol/L — ABNORMAL HIGH (ref 135–145)
Total Bilirubin: 0.4 mg/dL (ref 0.3–1.2)
Total Protein: 5.7 g/dL — ABNORMAL LOW (ref 6.5–8.1)

## 2019-05-03 LAB — CBC WITH DIFFERENTIAL/PLATELET
Abs Immature Granulocytes: 0.08 10*3/uL — ABNORMAL HIGH (ref 0.00–0.07)
Basophils Absolute: 0 10*3/uL (ref 0.0–0.1)
Basophils Relative: 0 %
Eosinophils Absolute: 0.2 10*3/uL (ref 0.0–0.5)
Eosinophils Relative: 3 %
HCT: 28.7 % — ABNORMAL LOW (ref 39.0–52.0)
Hemoglobin: 8.8 g/dL — ABNORMAL LOW (ref 13.0–17.0)
Immature Granulocytes: 1 %
Lymphocytes Relative: 11 %
Lymphs Abs: 0.8 10*3/uL (ref 0.7–4.0)
MCH: 29.8 pg (ref 26.0–34.0)
MCHC: 30.7 g/dL (ref 30.0–36.0)
MCV: 97.3 fL (ref 80.0–100.0)
Monocytes Absolute: 0.3 10*3/uL (ref 0.1–1.0)
Monocytes Relative: 5 %
Neutro Abs: 5.7 10*3/uL (ref 1.7–7.7)
Neutrophils Relative %: 80 %
Platelets: 102 10*3/uL — ABNORMAL LOW (ref 150–400)
RBC: 2.95 MIL/uL — ABNORMAL LOW (ref 4.22–5.81)
RDW: 17.5 % — ABNORMAL HIGH (ref 11.5–15.5)
WBC: 7.1 10*3/uL (ref 4.0–10.5)
nRBC: 0 % (ref 0.0–0.2)

## 2019-05-03 LAB — GLUCOSE, CAPILLARY
Glucose-Capillary: 124 mg/dL — ABNORMAL HIGH (ref 70–99)
Glucose-Capillary: 118 mg/dL — ABNORMAL HIGH (ref 70–99)
Glucose-Capillary: 86 mg/dL (ref 70–99)
Glucose-Capillary: 161 mg/dL — ABNORMAL HIGH (ref 70–99)
Glucose-Capillary: 136 mg/dL — ABNORMAL HIGH (ref 70–99)
Glucose-Capillary: 148 mg/dL — ABNORMAL HIGH (ref 70–99)

## 2019-05-03 LAB — C-REACTIVE PROTEIN: CRP: 1.8 mg/dL — ABNORMAL HIGH (ref <1.0)

## 2019-05-03 LAB — MAGNESIUM: Magnesium: 2.2 mg/dL (ref 1.7–2.4)

## 2019-05-03 LAB — D-DIMER, QUANTITATIVE: D-Dimer, Quant: 2.73 ug/mL-FEU — ABNORMAL HIGH (ref 0.00–0.50)

## 2019-05-03 MED ORDER — ALBUTEROL SULFATE HFA 108 (90 BASE) MCG/ACT IN AERS
2.0000 | INHALATION_SPRAY | Freq: Four times a day (QID) | RESPIRATORY_TRACT | Status: DC
  Administered 2019-05-03 – 2019-05-19 (×59): 2 via RESPIRATORY_TRACT
  Filled 2019-05-03 (×3): qty 6.7

## 2019-05-03 MED ORDER — INSULIN GLARGINE 100 UNIT/ML ~~LOC~~ SOLN
15.0000 [IU] | Freq: Every day | SUBCUTANEOUS | Status: DC
  Administered 2019-05-04 – 2019-05-15 (×12): 15 [IU] via SUBCUTANEOUS
  Filled 2019-05-03 (×12): qty 0.15

## 2019-05-03 MED ORDER — RESOURCE THICKENUP CLEAR PO POWD
ORAL | Status: DC | PRN
  Filled 2019-05-03 (×7): qty 125

## 2019-05-03 NOTE — Procedures (Signed)
Extubation Procedure Note  Patient Details:   Name: Robert [name] DOB: 29-May-1951 MRN: [id number]   Airway Documentation:    Vent end date: 05/03/19 Vent end time: 0930   Evaluation  O2 sats: stable throughout Complications: No apparent complications Patient did tolerate procedure well. Bilateral Breath Sounds: Clear, Diminished   Yes   Positive cuff leak noted. Patient placed on [place] HFNC 5 L with humidity. No stridor noted.   Bayard Beaver 05/03/2019, 9:38 AM

## 2019-05-03 NOTE — Progress Notes (Signed)
[facility] PCCM  I called his daughter for an update and let her know he was extubated  Roselie Awkward, MD [place] PCCM Pager: [phone] Cell: [phone] If no response, call [phone]

## 2019-05-03 NOTE — Progress Notes (Signed)
Order for swallow eval received. I will be over in the afternoon. Page me or secure chat if pts status changes.  Thanks,  [name], MA [place]  Acute Rehabilitation Services Pager [phone] Office [phone]

## 2019-05-03 NOTE — Evaluation (Signed)
Clinical/Bedside Swallow Evaluation Patient Details  Name: Robert [name] MRN: [id number] Date of Birth: 1951-04-25  Today's Date: 05/03/2019 Time: SLP Start Time (ACUTE ONLY): 1520 SLP Stop Time (ACUTE ONLY): 1544 SLP Time Calculation (min) (ACUTE ONLY): 24 min  Past Medical History:  Past Medical History:  Diagnosis Date  . Anxiety   . COPD (chronic obstructive pulmonary disease) ([place])   . Coronary artery disease   . Full dentures   . GERD (gastroesophageal reflux disease)    Rolaids as needed  . High cholesterol   . History of MI (myocardial infarction)   . Hypertension    med. dosage increased 04/2016; has been on BP med. x 10 yrs.  . Incarcerated umbilical hernia 0000000  . Inguinal hernia 05/2016   bilateral   . Insulin dependent diabetes mellitus ([place])   . Ischemic cardiomyopathy    a. 03/2015 EF 40-45% by LV gram.  . Left leg cellulitis 10/08/2016  . Rheumatoid arthritis(714.0)    Past Surgical History:  Past Surgical History:  Procedure Laterality Date  . CARDIAC CATHETERIZATION N/A 02/01/2015   Procedure: Left Heart Cath and Coronary Angiography;  Surgeon: Belva Crome, MD;  Location: [facility];  Service: Cardiovascular;  Laterality: N/A;  . CARDIAC CATHETERIZATION N/A 03/22/2015   Procedure: Left Heart Cath and Coronary Angiography;  Surgeon: Leonie Man, MD;  Location: [facility];  Service: Cardiovascular;  Laterality: N/A;  . CARDIAC CATHETERIZATION  09/05/2002; 03/09/2003; 04/10/2005;06/02/2005; 05/09/2006; 02/09/2007; 03/01/2007  . COLONOSCOPY  2008   Dr. Gala Romney: diverticulosis, hyperplastic polyp.  . CORONARY ANGIOPLASTY  11/14/2012; 02/01/2015  . CORONARY ANGIOPLASTY WITH STENT PLACEMENT  05/16/2012   "1; makes total ~ 4"  . CORONARY STENT INTERVENTION N/A 06/06/2017   Procedure: CORONARY STENT INTERVENTION;  Surgeon: Belva Crome, MD;  Location: [facility];  Service: Cardiovascular;  Laterality: N/A;  . INSERTION OF MESH Bilateral 05/24/2016    Procedure: INSERTION OF MESH;  Surgeon: Coralie Keens, MD;  Location: [facility];  Service: General;  Laterality: Bilateral;  . LAPAROSCOPIC CHOLECYSTECTOMY    . LAPAROSCOPIC INGUINAL HERNIA WITH UMBILICAL HERNIA Bilateral 05/24/2016   Procedure: BILATERAL LAPAROSCOPIC INGUINAL HERNIA REPAIR WITH MESH AND UMBILICAL HERNIA REPAIR WITH MESH;  Surgeon: Coralie Keens, MD;  Location: [facility];  Service: General;  Laterality: Bilateral;  . LEFT HEART CATH AND CORONARY ANGIOGRAPHY N/A 06/06/2017   Procedure: LEFT HEART CATH AND CORONARY ANGIOGRAPHY;  Surgeon: Belva Crome, MD;  Location: [facility];  Service: Cardiovascular;  Laterality: N/A;  . LEFT HEART CATHETERIZATION WITH CORONARY ANGIOGRAM N/A 12/22/2011   Procedure: LEFT HEART CATHETERIZATION WITH CORONARY ANGIOGRAM;  Surgeon: Troy Sine, MD;  Location: [facility] CATH LAB;  Service: Cardiovascular;  Laterality: N/A;  . LEFT HEART CATHETERIZATION WITH CORONARY ANGIOGRAM Bilateral 05/16/2012   Procedure: LEFT HEART CATHETERIZATION WITH CORONARY ANGIOGRAM;  Surgeon: Lorretta Harp, MD;  Location: [facility] CATH LAB;  Service: Cardiovascular;  Laterality: Bilateral;  . LEFT HEART CATHETERIZATION WITH CORONARY ANGIOGRAM N/A 11/14/2012   Procedure: LEFT HEART CATHETERIZATION WITH CORONARY ANGIOGRAM;  Surgeon: Troy Sine, MD;  Location: [facility] CATH LAB;  Service: Cardiovascular;  Laterality: N/A;  . LEFT HEART CATHETERIZATION WITH CORONARY ANGIOGRAM N/A 09/01/2013   Procedure: LEFT HEART CATHETERIZATION WITH CORONARY ANGIOGRAM;  Surgeon: Leonie Man, MD;  Location: [facility] CATH LAB;  Service: Cardiovascular;  Laterality: N/A;  . [place]; 1973; 1985   x 3  . [facility]  PERF WALL MOTION  08/30/2009   No significant ischemia  . OPEN REDUCTION INTERNAL FIXATION (ORIF) DISTAL RADIAL FRACTURE Right 12/10/2016   Procedure: OPEN REDUCTION INTERNAL FIXATION (ORIF) DISTAL RADIAL FRACTURE;  Surgeon: Iran Planas, MD;   Location: [facility];  Service: Orthopedics;  Laterality: Right;  . PERCUTANEOUS CORONARY INTERVENTION-BALLOON ONLY  12/22/2011   Procedure: PERCUTANEOUS CORONARY INTERVENTION-BALLOON ONLY;  Surgeon: Troy Sine, MD;  Location: [facility] CATH LAB;  Service: Cardiovascular;;  . PERCUTANEOUS CORONARY INTERVENTION-BALLOON ONLY  11/14/2012   Procedure: PERCUTANEOUS CORONARY INTERVENTION-BALLOON ONLY;  Surgeon: Troy Sine, MD;  Location: [facility] CATH LAB;  Service: Cardiovascular;;  . PERCUTANEOUS CORONARY STENT INTERVENTION (PCI-S)  05/16/2012   Procedure: PERCUTANEOUS CORONARY STENT INTERVENTION (PCI-S);  Surgeon: Lorretta Harp, MD;  Location: [facility] CATH LAB;  Service: Cardiovascular;;  . PERCUTANEOUS CORONARY STENT INTERVENTION (PCI-S)  09/01/2013   Procedure: PERCUTANEOUS CORONARY STENT INTERVENTION (PCI-S);  Surgeon: Leonie Man, MD;  Location: [facility] CATH LAB;  Service: Cardiovascular;;   HPI:  68 year old male with history of COPD, CAD, diabetes mellitus, ischemic cardiomyopathy, RA, presented to the hospital and was admitted on 04/11/2019 with shortness of breath, he was found to be significantly hypoxic, tested positive for COVID-19, was intubated and transferred to [facility] for further management. Intubated 8/1-8/2, reintubated 8/3-8/22.    Assessment / Plan / Recommendation Clinical Impression  Pt demonstrates acute dysphagia following 22 day intubation. Pt is alert and requesting water, has been given ice chips since extubation. He is dysphonic but has a strong cough. Immediate coughing occurs after ice and teaspoons of nectar thick liquids. Able to give several oz of honey thick liquids via spoon with one hard cough. Pt benefitted from supporting head in a forward position. Recommend pt remain NPO, will reassess on 8/24. Pt may have teaspoons of honey thick water in the meantime. Discussed with RN.  SLP Visit Diagnosis: Dysphagia, oropharyngeal phase (R13.12)    Aspiration Risk  Severe aspiration risk     Diet Recommendation NPO;Other (Comment)(teaspoons of honey thick water)   Liquid Administration via: Spoon Medication Administration: Via alternative means Compensations: Chin tuck Postural Changes: Seated upright at 90 degrees    Other  Recommendations Oral Care Recommendations: Oral care BID Other Recommendations: Order thickener from pharmacy   Follow up Recommendations Inpatient Rehab      Frequency and Duration min 2x/week  2 weeks       Prognosis Prognosis for Safe Diet Advancement: Good      Swallow Study   General HPI: 68 year old male with history of COPD, CAD, diabetes mellitus, ischemic cardiomyopathy, RA, presented to the hospital and was admitted on 04/11/2019 with shortness of breath, he was found to be significantly hypoxic, tested positive for COVID-19, was intubated and transferred to [facility] for further management. Intubated 8/1-8/2, reintubated 8/3-8/22.  Type of Study: Bedside Swallow Evaluation Previous Swallow Assessment: none Diet Prior to this Study: NPO Temperature Spikes Noted: No Respiratory Status: Nasal cannula History of Recent Intubation: Yes Length of Intubations (days): 22 days Date extubated: 05/03/19 Behavior/Cognition: Alert;Cooperative;Confused Oral Cavity Assessment: Within Functional Limits Oral Care Completed by SLP: Recent completion by staff Oral Cavity - Dentition: Edentulous Self-Feeding Abilities: Total assist Patient Positioning: Upright in bed Baseline Vocal Quality: Hoarse;Low vocal intensity Volitional Cough: Strong Volitional Swallow: Able to elicit    Oral/Motor/Sensory Function Overall Oral Motor/Sensory Function: Within functional limits   Ice Chips Ice chips: Impaired Pharyngeal Phase Impairments: Cough - Immediate   Thin Liquid Thin Liquid: Not tested  Nectar Thick Nectar Thick Liquid: Impaired Presentation: Spoon Pharyngeal Phase Impairments: Cough - Immediate   Honey Thick Honey Thick Liquid:  Impaired Pharyngeal Phase Impairments: Cough - Immediate   Puree Puree: Not tested   Solid     Solid: Not tested     [name], MA CCC-SLP  Acute Rehabilitation Services Pager [phone] Office [phone]  Enzo Treu, Katherene Ponto 05/03/2019,4:38 PM

## 2019-05-03 NOTE — Progress Notes (Signed)
PROGRESS NOTE  Robert [name] [id number] DOB: 01/15/1951 DOA: 04/11/2019 PCP: Redmond School, MD   LOS: 21 days   Brief Narrative / Interim history: 68 year old male with history of COPD, CAD, diabetes mellitus, ischemic cardiomyopathy, RA, presented to the hospital and was admitted on 04/11/2019 with shortness of breath, he was found to be significantly hypoxic, tested positive for COVID-19, was intubated and transferred to [facility] for further management.  Significant events: 8/1 admission to [facility] ICU 8/2 extubated 8/3 early AM agitated, placed on BIPAP, required re-intubation 8/4 started on steroids for COPD exacerbation 8/8 septic shock requiring pressor support 8/9 off pressors, respiratory cultures +s.aureus 8/10 persistent encephalopathy off sedation, CT head ordered 8/11 encephalopathy improved 8/12 difficulty weaning due to tachypnea, ETT exchanged due to clot burden 8/13 PICC line inserted and subclavian central line removed 8/14 Antibiotics coverage broaden due to persistent fever and resp distress, pan cultured  8/19 more alert, tolerating pressure support 8/20 failed voiding trial, Foley needed to be reinserted 8/22 successfully extubated  Subjective: Awake this morning alert, no shortness of breath, to be extubated shortly  Assessment & Plan: Principal Problem:   Pneumonia due to COVID-19 virus Active Problems:   Rheumatoid arthritis ([place])   Hyperlipidemia   COPD (chronic obstructive pulmonary disease) ([place])   Controlled type 2 diabetes mellitus with diabetic peripheral angiopathy without gangrene, with long-term current use of insulin (HCC)   Essential hypertension   Ischemic cardiomyopathy   Coronary artery disease   Dyslipidemia (high LDL; low HDL)   COVID-19 virus detected   Acute respiratory distress syndrome (ARDS) due to COVID-19 virus   Atrial fibrillation with RVR (HCC)   COPD (chronic obstructive pulmonary disease) with emphysema (HCC)   AKI (acute  kidney injury) ([place])   Diabetes mellitus type 2, uncontrolled, with complications (HCC)   Pressure injury of skin   Staphylococcal pneumonia ([place])   HCAP (healthcare-associated pneumonia)   Principal Problem Acute Hypoxic Respiratory Failure due to Covid-19 Viral Illness, COPD exacerbation -Extubated this morning, continue to closely monitor in the ICU   Vent Mode: PSV;CPAP FiO2 (%):  [30 %] 30 % Set Rate:  [26 bmp] 26 bmp Vt Set:  [460 mL] 460 mL PEEP:  [5 cmH20] 5 cmH20 Pressure Support:  [5 cmH20] 5 cmH20 Plateau Pressure:  [13 cmH20-14 cmH20] 13 cmH20   Fever: afebrile  Temp (24hrs), Avg:98.5 F (36.9 C), Min:97.7 F (36.5 C), Max:99.4 F (37.4 C)  Antibiotics:  Cefepime 7/31 >8/4  Flagyl 7/31  Remdesivir 8/1 >8/5  Actemra 8/1  Decadron/solumedrol 8/1 >8/8  Zosyn 8/8>8/9 Azithro 8/7>8/9 Cefepime 8/14 >8/19 Vanc 7/31, 8/8>8/22  Remdesivir: 8/1 >> 8/5 Steroids: decadron / solumedrol 8/1 >> 8/5 Diuretics: 8/1 >> 8/5 Actemra: 8/1 Convalescent Plasma: none  Vitamin C and Zinc: yes   COVID-19 Labs  Recent Labs    05/01/19 0445 05/02/19 0510 05/02/19 0511 05/03/19 0435  DDIMER 2.52* 2.16*  --  2.73*  CRP 1.1*  --  1.1* 1.8*    Lab Results  Component Value Date   SARSCOV2NAA POSITIVE (A) 04/11/2019   SARSCOV2NAA Detected (A) 04/11/2019    Active Problems MRSA HCAP /septic shock -Off pressors now -Completed treatment with vancomycin for 3 weeks, last day was 8/22 -Respiratory status improved as above, extubated this morning.  Paroxysmal A. fib with RVR -Currently in sinus, on metoprolol 25 3 times daily -He was on IV heparin but no anticoagulation is on hold due to bleeding with ETT clotting as well as bleeding from the subclavian  line -Currently in sinus rhythm  Acute on chronic diastolic CHF, essential hypertension, history of CAD -History of PCI in 2018 with in-stent restenosis, currently on aspirin and DVT prophylaxis -will reintroduce  Plavix if he remains successfully extubated -He is still slightly fluid overloaded, +18 L, continue Lasix  Hypernatremia -Still elevated but stable while on Lasix.  Continue  Acute kidney injury -Creatinine peaked as high as 2.39 on 04/24/2019, possibly multifactorial in the setting of diuresis and use of vancomycin -He was also found to have retention requiring Foley catheter placement -His creatinine is stable  Acute urinary obstruction -In the setting of ICU stay. Failed voiding trial on 8/20 and Foley was placed back.  Continue Flomax -Once he is more mobile and working with physical therapy will have to have a repeat voiding trial  ETT clotting/bleeding from the subclavian line -Patient was on therapeutic heparin, ET tube was clotted and exchange on 04/23/2019.  Currently on heparin prophylaxis -Hemoglobin overall stable, continue to monitor  Thrombocytopenia -Chronic, stable  Type 2 diabetes mellitus, uncontrolled with hyperglycemia with vascular disease and neuropathy at baseline -Now that he is extubated speech to see, CBGs are currently stable.  Not sure about p.o. intake over the next 34 hours, will decrease Lantus  CBG (last 3)  Recent Labs    05/03/19 0121 05/03/19 0403 05/03/19 0746  GLUCAP 124* 118* 86   RA -He is on Actemra and methotrexate at home, currently on hold  Pressure ulcer of medial sacrum bilateral buttocks Pressure Injury 04/17/19 Sacrum Medial Deep Tissue Injury - Purple or maroon localized area of discolored intact skin or blood-filled blister due to damage of underlying soft tissue from pressure and/or shear. dark purple with some skin sloughing on lowe (Active)  04/17/19 0730  Location: Sacrum  Location Orientation: Medial  Staging: Deep Tissue Injury - Purple or maroon localized area of discolored intact skin or blood-filled blister due to damage of underlying soft tissue from pressure and/or shear.  Wound Description (Comments): dark purple  with some skin sloughing on lower sacral area  Present on Admission: No     Pressure Injury 04/17/19 Buttocks Right;Lower Unstageable - Full thickness tissue loss in which the base of the ulcer is covered by slough (yellow, tan, gray, green or brown) and/or eschar (tan, brown or black) in the wound bed. (Active)  04/17/19 0730  Location: Buttocks  Location Orientation: Right;Lower  Staging: Unstageable - Full thickness tissue loss in which the base of the ulcer is covered by slough (yellow, tan, gray, green or brown) and/or eschar (tan, brown or black) in the wound bed.  Wound Description (Comments):   Present on Admission: No     Pressure Injury 04/17/19 Buttocks Left;Lower Deep Tissue Injury - Purple or maroon localized area of discolored intact skin or blood-filled blister due to damage of underlying soft tissue from pressure and/or shear. (Active)  04/17/19 0730  Location: Buttocks  Location Orientation: Left;Lower  Staging: Deep Tissue Injury - Purple or maroon localized area of discolored intact skin or blood-filled blister due to damage of underlying soft tissue from pressure and/or shear.  Wound Description (Comments):   Present on Admission: No     Scheduled Meds: . aspirin  81 mg Per Tube Daily  . bethanechol  10 mg Per Tube BID  . Chlorhexidine Gluconate Cloth  6 each Topical Daily  . feeding supplement (PRO-STAT SUGAR FREE 64)  30 mL Per Tube TID  . folic acid  1 mg Per Tube Daily  .  free water  200 mL Per Tube Q4H  . furosemide  40 mg Intravenous BID  . gabapentin  100 mg Per Tube Q8H  . heparin injection (subcutaneous)  7,500 Units Subcutaneous Q8H  . insulin aspart  0-15 Units Subcutaneous Q4H  . insulin aspart  10 Units Subcutaneous Q4H  . insulin glargine  15 Units Subcutaneous BID  . metoprolol tartrate  25 mg Per Tube TID  . multivitamin  15 mL Per Tube Daily  . pantoprazole sodium  40 mg Per Tube Daily  . sodium chloride flush  10-40 mL Intracatheter Q12H  .  tamsulosin  0.4 mg Oral QPC supper  . traZODone  50 mg Oral QHS  . vitamin C  500 mg Per Tube Daily  . zinc sulfate  220 mg Per Tube Daily   Continuous Infusions: . sodium chloride 10 mL/hr at 05/03/19 0600  . feeding supplement (VITAL AF 1.2 CAL) 70 mL/hr at 05/03/19 0630   PRN Meds:.sodium chloride, acetaminophen, dextrose, fentaNYL (SUBLIMAZE) injection, hydrALAZINE, HYDROmorphone, ipratropium-albuterol, sodium chloride flush, Thrombi-Pad   DVT prophylaxis: heparin Code Status: Full code Family Communication: Per PCCM Disposition Plan: remain in ICU   Consultants:   PCCM  Procedures:   None   Micro data: 7/31 blood >NGTD 7/31 sars-cov-2 >positive 8/1 Resp CX > Normal flora 8/7 Resp CX >MRSA 8/7 BCX > NGTD 8/8 UCX > NGTD 8/15 Resp culture>  MRSA    Objective: Vitals:   05/03/19 0500 05/03/19 0600 05/03/19 0818 05/03/19 0936  BP: (!) 142/74 118/66 126/70 126/72  Pulse: 81 84 92 86  Resp: (!) 25 (!) 26 (!) 31 (!) 28  Temp:      TempSrc:      SpO2: 100% 99% 100% 100%  Weight:      Height:        Intake/Output Summary (Last 24 hours) at 05/03/2019 1021 Last data filed at 05/03/2019 0800 Gross per 24 hour  Intake 2925.54 ml  Output 4585 ml  Net -1659.46 ml   Filed Weights   05/01/19 0400 05/02/19 0500 05/03/19 0400  Weight: 106.5 kg 107.7 kg 107.4 kg    Examination:  Constitutional: Awake, about to be extubated, appears comfortable Eyes: No scleral icterus ENMT: Moist mucous membranes Neck: normal, supple Respiratory: Clear, no wheezing, no crackles, moving air well Cardiovascular: Regular rate and rhythm, no murmurs.  No peripheral edema Abdomen: Soft, no guarding, positive bowel sounds Musculoskeletal: no clubbing / cyanosis. Skin: No rashes appreciated Neurologic: Nonfocal, equal strength  Data Reviewed: I have independently reviewed following labs and imaging studies    CBC: Recent Labs  Lab May 12, 2019 0500 04/30/19 0350 05/01/19 0445  05/02/19 0510 05/03/19 0435  WBC 10.2 9.0 7.5 6.5 7.1  NEUTROABS 8.5* 7.7 6.0 5.0 5.7  HGB 10.5* 9.9* 8.8* 8.9* 8.8*  HCT 35.1* 33.0* 28.8* 28.7* 28.7*  MCV 100.3* 100.6* 99.0 98.0 97.3  PLT 131* 108* 106* 98* [id number]*   Basic Metabolic Panel: Recent Labs  Lab 12-May-2019 0500 04/30/19 0350 04/30/19 1200 05/01/19 0445 05/02/19 0510 05/03/19 0435  NA 151* 150* 151* 148* 150* 150*  K 4.8 5.2* 4.6 4.3 4.5 4.4  CL 120* 121* 123* 118* 121* 118*  CO2 26 26 23 26 25 25   GLUCOSE 183* 228* 199* 162* 113* 108*  BUN 64* 61* 60* 60* 62* 65*  CREATININE 1.18 1.25* 1.16 1.18 1.27* 1.28*  CALCIUM 8.6* 8.6* 8.3* 8.2* 8.5* 8.7*  MG 2.3 2.2  --  2.3 2.2 2.2   GFR: Estimated  Creatinine Clearance: 71.9 mL/min (A) (by C-G formula based on SCr of 1.28 mg/dL (H)). Liver Function Tests: Recent Labs  Lab 04/29/19 0500 04/30/19 0350 05/01/19 0445 05/02/19 0510 05/03/19 0435  AST 60* 45* 47* 47* 47*  ALT 115* 96* 90* 80* 79*  ALKPHOS 78 79 84 83 92  BILITOT 0.3 0.5 0.3 0.2* 0.4  PROT 5.8* 5.7* 5.3* 5.5* 5.7*  ALBUMIN 2.3* 2.2* 2.1* 2.0* 2.1*   No results for input(s): LIPASE, AMYLASE in the last 168 hours. No results for input(s): AMMONIA in the last 168 hours. Coagulation Profile: No results for input(s): INR, PROTIME in the last 168 hours. Cardiac Enzymes: No results for input(s): CKTOTAL, CKMB, CKMBINDEX, TROPONINI in the last 168 hours. BNP (last 3 results) No results for input(s): PROBNP in the last 8760 hours. HbA1C: No results for input(s): HGBA1C in the last 72 hours. CBG: Recent Labs  Lab 05/02/19 1532 05/02/19 2116 05/03/19 0121 05/03/19 0403 05/03/19 0746  GLUCAP 100* 93 124* 118* 86   Lipid Profile: No results for input(s): CHOL, HDL, LDLCALC, TRIG, CHOLHDL, LDLDIRECT in the last 72 hours. Thyroid Function Tests: No results for input(s): TSH, T4TOTAL, FREET4, T3FREE, THYROIDAB in the last 72 hours. Anemia Panel: No results for input(s): VITAMINB12, FOLATE, FERRITIN,  TIBC, IRON, RETICCTPCT in the last 72 hours. Urine analysis:    Component Value Date/Time   COLORURINE YELLOW 04/25/2019 0826   APPEARANCEUR HAZY (A) 04/25/2019 0826   LABSPEC 1.015 04/25/2019 0826   PHURINE 5.0 04/25/2019 0826   GLUCOSEU 150 (A) 04/25/2019 0826   HGBUR LARGE (A) 04/25/2019 0826   BILIRUBINUR NEGATIVE 04/25/2019 0826   KETONESUR NEGATIVE 04/25/2019 0826   PROTEINUR 30 (A) 04/25/2019 0826   UROBILINOGEN 0.2 12/22/2011 2130   NITRITE NEGATIVE 04/25/2019 0826   LEUKOCYTESUR NEGATIVE 04/25/2019 0826   Sepsis Labs: Invalid input(s): PROCALCITONIN, LACTICIDVEN  Recent Results (from the past 240 hour(s))  Culture, blood (routine x 2)     Status: None   Collection Time: 04/25/19  9:40 AM   Specimen: BLOOD LEFT HAND  Result Value Ref Range Status   Specimen Description   Final    BLOOD LEFT HAND Performed at [facility] [address] [place], [place] [address]    Special Requests   Final    BOTTLES DRAWN AEROBIC ONLY Blood Culture results may not be optimal due to an inadequate volume of blood received in culture bottles Performed at [facility] [address] [place], [place] [address]    Culture   Final    NO GROWTH 5 DAYS Performed at [facility], [place] [address], [place], [place] [address]    Report Status 04/30/2019 FINAL  Final  Culture, blood (routine x 2)     Status: None   Collection Time: 04/25/19  9:45 AM   Specimen: BLOOD RIGHT HAND  Result Value Ref Range Status   Specimen Description   Final    BLOOD RIGHT HAND Performed at [facility] [address] [place], [place] [address]    Special Requests   Final    BOTTLES DRAWN AEROBIC ONLY Blood Culture adequate volume Performed at [facility] [address], [place], [place] [address]    Culture   Final    NO GROWTH 5 DAYS Performed at [facility], [place] [address] [place], [place] [address]     Report Status 04/30/2019 FINAL  Final  Culture, respiratory (non-expectorated)  Status: None   Collection Time: 04/26/19  8:46 AM   Specimen: Tracheal Aspirate; Respiratory  Result Value Ref Range Status   Specimen Description   Final    TRACHEAL ASPIRATE Performed at [facility] [address] [place], [place] [address]    Special Requests   Final    NONE Performed at [facility], [place] [address] [place], [place] [address]    Gram Stain   Final    FEW WBC PRESENT, PREDOMINANTLY PMN FEW GRAM POSITIVE COCCI IN CLUSTERS FEW GRAM NEGATIVE RODS RARE GRAM POSITIVE RODS RARE YEAST Performed at [facility], [place] [address], [place], [place] [address]    Culture   Final    ABUNDANT METHICILLIN RESISTANT STAPHYLOCOCCUS AUREUS   Report Status 04/29/2019 FINAL  Final   Organism ID, Bacteria METHICILLIN RESISTANT STAPHYLOCOCCUS AUREUS  Final      Susceptibility   Methicillin resistant staphylococcus aureus - MIC*    CIPROFLOXACIN <=0.5 SENSITIVE Sensitive     ERYTHROMYCIN >=8 RESISTANT Resistant     GENTAMICIN <=0.5 SENSITIVE Sensitive     OXACILLIN >=4 RESISTANT Resistant     TETRACYCLINE <=1 SENSITIVE Sensitive     VANCOMYCIN 1 SENSITIVE Sensitive     TRIMETH/SULFA <=10 SENSITIVE Sensitive     CLINDAMYCIN <=0.25 SENSITIVE Sensitive     RIFAMPIN <=0.5 SENSITIVE Sensitive     Inducible Clindamycin NEGATIVE Sensitive     * ABUNDANT METHICILLIN RESISTANT STAPHYLOCOCCUS AUREUS      Radiology Studies: No results found. Marzetta Board, MD, PhD [facility]  Contact via  [email or web address]  [place] P: [phone] F: [phone]

## 2019-05-03 NOTE — Progress Notes (Signed)
Patient instructed on the use of Incentive spirometer. Patient reached 1250 mL with his first attempt.

## 2019-05-03 NOTE — Progress Notes (Signed)
Nursing staff offered to trim or shave patients beard earlier in the shift, but  patient declines/nods head 'no' when offer made. After assessment and mouth care completed, it is noted that his upper mustache hangs over his upper lip and mouth. For mouth care to be completed, mustache has to be raised to thoroughly view oral cavity while mouth care is being performed. Nursing staff offered to trim patients mustache only. Patient consented, nodding head 'yes' when asked if it was okay to just trim his mustache. Since patient has now consented to mustache to be trim, offer made to trim patients whole beard. Patient nods 'no' consistently to having beard trimmed. Mustache trimmed with wash cloth underneath patients mouth. Nursing staff showed patient as confirmation that mustache was only trimmed and not shaven, with minimal trimmings on wash cloth. Patient seems pleased with having mustache trimmed.

## 2019-05-04 LAB — COMPREHENSIVE METABOLIC PANEL
ALT: 72 U/L — ABNORMAL HIGH (ref 0–44)
AST: 41 U/L (ref 15–41)
Albumin: 2.2 g/dL — ABNORMAL LOW (ref 3.5–5.0)
Alkaline Phosphatase: 90 U/L (ref 38–126)
Anion gap: 5 (ref 5–15)
BUN: 66 mg/dL — ABNORMAL HIGH (ref 8–23)
CO2: 28 mmol/L (ref 22–32)
Calcium: 8.7 mg/dL — ABNORMAL LOW (ref 8.9–10.3)
Chloride: 118 mmol/L — ABNORMAL HIGH (ref 98–111)
Creatinine, Ser: 1.26 mg/dL — ABNORMAL HIGH (ref 0.61–1.24)
GFR calc Af Amer: >60 mL/min (ref >60)
GFR calc non Af Amer: 59 mL/min — ABNORMAL LOW (ref >60)
Glucose, Bld: 148 mg/dL — ABNORMAL HIGH (ref 70–99)
Potassium: 4.1 mmol/L (ref 3.5–5.1)
Sodium: 151 mmol/L — ABNORMAL HIGH (ref 135–145)
Total Bilirubin: 0.4 mg/dL (ref 0.3–1.2)
Total Protein: 5.8 g/dL — ABNORMAL LOW (ref 6.5–8.1)

## 2019-05-04 LAB — CBC WITH DIFFERENTIAL/PLATELET
Abs Immature Granulocytes: 0.06 10*3/uL (ref 0.00–0.07)
Basophils Absolute: 0 10*3/uL (ref 0.0–0.1)
Basophils Relative: 0 %
Eosinophils Absolute: 0.2 10*3/uL (ref 0.0–0.5)
Eosinophils Relative: 3 %
HCT: 29.1 % — ABNORMAL LOW (ref 39.0–52.0)
Hemoglobin: 9 g/dL — ABNORMAL LOW (ref 13.0–17.0)
Immature Granulocytes: 1 %
Lymphocytes Relative: 11 %
Lymphs Abs: 0.8 10*3/uL (ref 0.7–4.0)
MCH: 30 pg (ref 26.0–34.0)
MCHC: 30.9 g/dL (ref 30.0–36.0)
MCV: 97 fL (ref 80.0–100.0)
Monocytes Absolute: 0.4 10*3/uL (ref 0.1–1.0)
Monocytes Relative: 5 %
Neutro Abs: 5.7 10*3/uL (ref 1.7–7.7)
Neutrophils Relative %: 80 %
Platelets: 99 10*3/uL — ABNORMAL LOW (ref 150–400)
RBC: 3 MIL/uL — ABNORMAL LOW (ref 4.22–5.81)
RDW: 17.7 % — ABNORMAL HIGH (ref 11.5–15.5)
WBC: 7.1 10*3/uL (ref 4.0–10.5)
nRBC: 0 % (ref 0.0–0.2)

## 2019-05-04 LAB — POCT I-STAT 7, (LYTES, BLD GAS, ICA,H+H)
Acid-Base Excess: 2 mmol/L (ref 0.0–2.0)
Bicarbonate: 27.2 mmol/L (ref 20.0–28.0)
Calcium, Ion: 1.33 mmol/L (ref 1.15–1.40)
HCT: 24 % — ABNORMAL LOW (ref 39.0–52.0)
Hemoglobin: 8.2 g/dL — ABNORMAL LOW (ref 13.0–17.0)
O2 Saturation: 95 %
Patient temperature: 98.9
Potassium: 3.9 mmol/L (ref 3.5–5.1)
Sodium: 151 mmol/L — ABNORMAL HIGH (ref 135–145)
TCO2: 29 mmol/L (ref 22–32)
pCO2 arterial: 46.7 mmHg (ref 32.0–48.0)
pH, Arterial: 7.374 (ref 7.350–7.450)
pO2, Arterial: 77 mmHg — ABNORMAL LOW (ref 83.0–108.0)

## 2019-05-04 LAB — GLUCOSE, CAPILLARY
Glucose-Capillary: 107 mg/dL — ABNORMAL HIGH (ref 70–99)
Glucose-Capillary: 132 mg/dL — ABNORMAL HIGH (ref 70–99)
Glucose-Capillary: 148 mg/dL — ABNORMAL HIGH (ref 70–99)
Glucose-Capillary: 185 mg/dL — ABNORMAL HIGH (ref 70–99)
Glucose-Capillary: 193 mg/dL — ABNORMAL HIGH (ref 70–99)
Glucose-Capillary: 201 mg/dL — ABNORMAL HIGH (ref 70–99)
Glucose-Capillary: 210 mg/dL — ABNORMAL HIGH (ref 70–99)

## 2019-05-04 LAB — AMMONIA: Ammonia: 22 umol/L (ref 9–35)

## 2019-05-04 LAB — C-REACTIVE PROTEIN: CRP: 2.1 mg/dL — ABNORMAL HIGH (ref <1.0)

## 2019-05-04 LAB — MAGNESIUM: Magnesium: 2.1 mg/dL (ref 1.7–2.4)

## 2019-05-04 LAB — D-DIMER, QUANTITATIVE: D-Dimer, Quant: 3.03 ug/mL-FEU — ABNORMAL HIGH (ref 0.00–0.50)

## 2019-05-04 MED ORDER — MOMETASONE FURO-FORMOTEROL FUM 200-5 MCG/ACT IN AERO
2.0000 | INHALATION_SPRAY | Freq: Two times a day (BID) | RESPIRATORY_TRACT | Status: DC
  Administered 2019-05-04 – 2019-05-19 (×31): 2 via RESPIRATORY_TRACT
  Filled 2019-05-04: qty 8.8

## 2019-05-04 MED ORDER — CHLORHEXIDINE GLUCONATE 0.12 % MT SOLN
15.0000 mL | Freq: Two times a day (BID) | OROMUCOSAL | Status: DC
  Administered 2019-05-04 – 2019-05-14 (×21): 15 mL via OROMUCOSAL
  Filled 2019-05-04 (×18): qty 15

## 2019-05-04 MED ORDER — ORAL CARE MOUTH RINSE
15.0000 mL | Freq: Two times a day (BID) | OROMUCOSAL | Status: DC
  Administered 2019-05-04 – 2019-05-13 (×20): 15 mL via OROMUCOSAL

## 2019-05-04 NOTE — Progress Notes (Signed)
[facility] Progress Note Patient Name: Robert [name] DOB: 1951-05-20 MRN: [id number]   Date of Service  05/04/2019  HPI/Events of Note  Blood glucose = 101.   eICU Interventions  Will hold the Novolog 10 unit dose scheduled for now.      Intervention Category Major Interventions: Hyperglycemia - active titration of insulin therapy  Levert Heslop [place] 05/04/2019, 1:26 AM

## 2019-05-04 NOTE — Progress Notes (Signed)
[facility] PCCM  I called his daughter to give her an update. She voiced understanding.  Roselie Awkward, MD [place] [facility]M Pager: [phone] Cell: [phone] If no response, call [phone]

## 2019-05-04 NOTE — Progress Notes (Signed)
Daughter, Robert [name], updated via telephone. Patient spoke with daughter for several minutes via speaker phone.

## 2019-05-04 NOTE — Progress Notes (Signed)
NAME:  Robert [name], MRN:  [id number], DOB:  [birth year], LOS: 33 ADMISSION DATE:  04/11/2019, CONSULTATION DATE:  8/1 REFERRING MD:  Roderic Palau, CHIEF COMPLAINT:  Dyspnea   Brief History   68 year old male admitted on 8/1 in the setting of ARDS from COVID-19 pneumonia. Initially presented to [facility] on 7/31 and required intubation. Post-procedure in the ED, patient awakened and self-extubated and required re-intubation. At [facility], tolerated PS and subsequently extubated on 8/2 however required intubation within 24 hours. Hospital course complicated by septic shock 2/2 MRSA pneumonia and volume overload.   Past Medical History  Anxiety COPD GERD CAD Hypertension Hyperlipidemia Rheumatoid arthritis History of systolic heart failure, however 08/2018 Echo showed LVEF 50-55%, left atrium dilated  Significant Hospital Events   8/1 admission to [facility] ICU 8/2 extubated 8/3 early AM agitated, placed on BIPAP, required re-intubation 8/4 started on steroids for COPD exacerbation 8/8 septic shock requiring pressor support 8/9 off pressors, respiratory cultures +s.aureus 8/10 persistent encephalopathy off sedation, CT head ordered 8/11 encephalopathy improved 8/12 difficulty weaning due to tachypnea, ETT exchanged due to dried blood clot burden 8/13 Bleeding from subclavian CVL.  Exchanged for PICC line 8/17 started weaning on PSV 14/5 8/22 extubated  Consults:  PCCM  Procedures:  8/1 ETT >8/2, 8/3 > 8/22 8/1 L subclavian CVL >8/13 8/13 Rt PICC  Significant Diagnostic Tests:  08/2018 Echo> LVEF 50-55%, left atrium dilated  CT head 8/11- mild age-related atrophy and chronic microvascular ischemic changes.  Old lacunar infarct.  Micro Data:  7/31 blood >NGTD 7/31 sars-cov-2 > positive 8/1 Resp CX > Normal flora 8/7 Resp CX > MRSA 8/7 BCX > NGTD 8/8 UCX > NGTD 8/15 Resp culture>  MRSA   Antimicrobials:  Cefepime 7/31 > 8/4  Flagyl 7/31  Remdesivir 8/1 > 8/5  Actemra 8/1   Decadron/solumedrol 8/1 >8/8  Zosyn 8/8>8/9 Azithro 8/7>8/9   Cefepime 8/14 >  Vanc 7/31, 8/8> 8/22   Interim history/subjective:   Extubated yesterday Didn't sleep much overnight  Objective   Blood pressure (!) 147/61, pulse 83, temperature 98.7 F (37.1 C), temperature source Oral, resp. rate (!) 22, height 6' (1.829 m), weight 104.5 kg, SpO2 99 %. CVP:  [3 mmHg] 3 mmHg  Vent Mode: PSV;CPAP FiO2 (%):  [30 %] 30 % PEEP:  [5 cmH20] 5 cmH20 Pressure Support:  [5 cmH20] 5 cmH20   Intake/Output Summary (Last 24 hours) at 05/04/2019 0751 Last data filed at 05/04/2019 [id number] Gross per 24 hour  Intake 1979.95 ml  Output 4370 ml  Net -2390.05 ml   Filed Weights   05/02/19 0500 05/03/19 0400 05/04/19 0400  Weight: 107.7 kg 107.4 kg 104.5 kg    General:  Drowsy, in ved HENT: NCAT OP clear PULM: CTA B, normal effort CV: RRR, no mgr GI: BS+, soft, nontender MSK: normal bulk and tone Neuro: very sleepy, will arouse to sternal rub, say his name and follow commands, moves all four ext   8/14 CXR> ETT in place, lungs fairly clear compared to admission  ABG    Component Value Date/Time   PHART 7.374 05/04/2019 0941   PCO2ART 46.7 05/04/2019 0941   PO2ART 77.0 (L) 05/04/2019 0941   HCO3 27.2 05/04/2019 0941   TCO2 29 05/04/2019 0941   ACIDBASEDEF 1.0 04/24/2019 0424   O2SAT 95.0 05/04/2019 0941     Resolved Hospital Problem list     Assessment & Plan:  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: improved Continue to monitor carefully in ICU  Out of bed to chair Aspiration precautions ICE Chips  MRSA pneumonia Monitor off of vancomycin  COPD exacerbation: resolved Continue albuterol as ordered (HFA) Add Dulera 2 puff bid  Drowsy 8/23, ABG OK, will arouse Stop trazodone 8/23, consider re-start at 25mg  qHS if insomnia returns Lights on during daytime Frequent orientation  Physical deconditioning PT consult  Best practice:  Diet: Tube feeding Pain/Anxiety/Delirium  protocol (if indicated): n/a VAP protocol (if indicated): n/a DVT prophylaxis: Lovenox GI prophylaxis: OK to d/c Pantoprazole Glucose control: SSI Mobility: bed rest Code Status: full Family Communication:  Disposition: remain in ICU  Labs   CBC: Recent Labs  Lab 04/30/19 0350 05/01/19 0445 05/02/19 0510 05/03/19 0435 05/04/19 0515  WBC 9.0 7.5 6.5 7.1 7.1  NEUTROABS 7.7 6.0 5.0 5.7 5.7  HGB 9.9* 8.8* 8.9* 8.8* 9.0*  HCT 33.0* 28.8* 28.7* 28.7* 29.1*  MCV 100.6* 99.0 98.0 97.3 97.0  PLT 108* 106* 98* 102* 99*    Basic Metabolic Panel: Recent Labs  Lab 04/30/19 0350 04/30/19 1200 05/01/19 0445 05/02/19 0510 05/03/19 0435 05/04/19 0515  NA 150* 151* 148* 150* 150* 151*  K 5.2* 4.6 4.3 4.5 4.4 4.1  CL 121* 123* 118* 121* 118* 118*  CO2 26 23 26 25 25 28   GLUCOSE 228* 199* 162* 113* 108* 148*  BUN 61* 60* 60* 62* 65* 66*  CREATININE 1.25* 1.16 1.18 1.27* 1.28* 1.26*  CALCIUM 8.6* 8.3* 8.2* 8.5* 8.7* 8.7*  MG 2.2  --  2.3 2.2 2.2 2.1   GFR: Estimated Creatinine Clearance: 72.1 mL/min (A) (by C-G formula based on SCr of 1.26 mg/dL (H)). Recent Labs  Lab 05/01/19 0445 05/02/19 0510 05/03/19 0435 05/04/19 0515  WBC 7.5 6.5 7.1 7.1    Liver Function Tests: Recent Labs  Lab 04/30/19 0350 05/01/19 0445 05/02/19 0510 05/03/19 0435 05/04/19 0515  AST 45* 47* 47* 47* 41  ALT 96* 90* 80* 79* 72*  ALKPHOS 79 84 83 92 90  BILITOT 0.5 0.3 0.2* 0.4 0.4  PROT 5.7* 5.3* 5.5* 5.7* 5.8*  ALBUMIN 2.2* 2.1* 2.0* 2.1* 2.2*   No results for input(s): LIPASE, AMYLASE in the last 168 hours. No results for input(s): AMMONIA in the last 168 hours.  ABG    Component Value Date/Time   PHART 7.412 04/24/2019 0424   PCO2ART 36.9 04/24/2019 0424   PO2ART 84.0 04/24/2019 0424   HCO3 23.2 04/24/2019 0424   TCO2 24 04/24/2019 0424   ACIDBASEDEF 1.0 04/24/2019 0424   O2SAT 95.0 04/24/2019 0424     Coagulation Profile: No results for input(s): INR, PROTIME in the last  168 hours.  Cardiac Enzymes: No results for input(s): CKTOTAL, CKMB, CKMBINDEX, TROPONINI in the last 168 hours.  HbA1C: Hgb A1c MFr Bld  Date/Time Value Ref Range Status  04/11/2019 09:50 PM 7.4 (H) 4.8 - 5.6 % Final    Comment:    (NOTE) Pre diabetes:          5.7%-6.4% Diabetes:              >6.4% Glycemic control for   <7.0% adults with diabetes   06/07/2017 12:02 AM 9.1 (H) 4.8 - 5.6 % Final    Comment:    (NOTE) Pre diabetes:          5.7%-6.4% Diabetes:              >6.4% Glycemic control for   <7.0% adults with diabetes     CBG: Recent Labs  Lab 05/03/19 1244 05/03/19  1620 05/03/19 2043 05/04/19 0106 05/04/19 0411  GLUCAP 161* 136* 148* 107* 132*     Critical care time: n/a      Roselie Awkward, MD [place] PCCM Pager: [phone] Cell: [phone] If no response, call [phone]

## 2019-05-04 NOTE — Progress Notes (Signed)
PROGRESS NOTE  Robert [name] [id number] DOB: 1951/05/08 DOA: 04/11/2019 PCP: Redmond School, MD   LOS: 22 days   Brief Narrative / Interim history: 68 year old male with history of COPD, CAD, diabetes mellitus, ischemic cardiomyopathy, RA, presented to the hospital and was admitted on 04/11/2019 with shortness of breath, he was found to be significantly hypoxic, tested positive for COVID-19, was intubated and transferred to [facility] for further management.  Significant events: 8/1 admission to [facility] ICU 8/2 extubated 8/3 early AM agitated, placed on BIPAP, required re-intubation 8/4 started on steroids for COPD exacerbation 8/8 septic shock requiring pressor support 8/9 off pressors, respiratory cultures +s.aureus 8/10 persistent encephalopathy off sedation, CT head ordered 8/11 encephalopathy improved 8/12 difficulty weaning due to tachypnea, ETT exchanged due to clot burden 8/13 PICC line inserted and subclavian central line removed 8/14 Antibiotics coverage broaden due to persistent fever and resp distress, pan cultured  8/19 more alert, tolerating pressure support 8/20 failed voiding trial, Foley needed to be reinserted 8/22 successfully extubated  Subjective: Quite lethargic this morning, is able to open his eyes when called but drifts back asleep  Assessment & Plan: Principal Problem:   Pneumonia due to COVID-19 virus Active Problems:   Rheumatoid arthritis ([place])   Hyperlipidemia   COPD (chronic obstructive pulmonary disease) ([place])   Controlled type 2 diabetes mellitus with diabetic peripheral angiopathy without gangrene, with long-term current use of insulin ([place])   Essential hypertension   Ischemic cardiomyopathy   Coronary artery disease   Dyslipidemia (high LDL; low HDL)   Acute respiratory failure with hypoxemia (HCC)   COVID-19 virus detected   Acute respiratory distress syndrome (ARDS) due to COVID-19 virus   Atrial fibrillation with RVR (HCC)   COPD (chronic  obstructive pulmonary disease) with emphysema (HCC)   AKI (acute kidney injury) ([place])   Diabetes mellitus type 2, uncontrolled, with complications ([place])   Pressure injury of skin   Staphylococcal pneumonia ([place])   HCAP (healthcare-associated pneumonia)   Principal Problem Acute Hypoxic Respiratory Failure due to Covid-19 Viral Illness, COPD exacerbation -Extubated on 8/22, seems to be stable from respiratory standpoint.  He is quite lethargic this morning, initial concerns for medications versus CO2 retention however he has not received any narcotics and ABG looks reassuring  ABG    Component Value Date/Time   PHART 7.374 05/04/2019 0941   PCO2ART 46.7 05/04/2019 0941   PO2ART 77.0 (L) 05/04/2019 0941   HCO3 27.2 05/04/2019 0941   TCO2 29 05/04/2019 0941   ACIDBASEDEF 1.0 04/24/2019 0424   O2SAT 95.0 05/04/2019 0941   -Obtain ammonia level for completeness, continue to closely monitor.  Fever: afebrile  Temp (24hrs), Avg:98.7 F (37.1 C), Min:97.9 F (36.6 C), Max:99.3 F (37.4 C)  Antibiotics:  Cefepime 7/31 >8/4  Flagyl 7/31  Remdesivir 8/1 >8/5  Actemra 8/1  Decadron/solumedrol 8/1 >8/8  Zosyn 8/8>8/9 Azithro 8/7>8/9 Cefepime 8/14 >8/19 Vanc 7/31, 8/8>8/22  Remdesivir: 8/1 >> 8/5 Steroids: decadron / solumedrol 8/1 >> 8/5 Diuretics: 8/1 >> 8/5 Actemra: 8/1 Convalescent Plasma: none  Vitamin C and Zinc: yes   COVID-19 Labs  Recent Labs    05/02/19 0510 05/02/19 0511 05/03/19 0435 05/04/19 0515  DDIMER 2.16*  --  2.73* 3.03*  CRP  --  1.1* 1.8* 2.1*    Lab Results  Component Value Date   SARSCOV2NAA POSITIVE (A) 04/11/2019   SARSCOV2NAA Detected (A) 04/11/2019    Active Problems MRSA HCAP /septic shock -Off pressors now -Completed treatment with vancomycin for 3 weeks,  last day was 8/22 -Respiratory status improving, extubated on 8/22 AM  Paroxysmal A. fib with RVR -Currently in sinus, on metoprolol 25 3 times daily -He was on IV  heparin but no anticoagulation is on hold due to bleeding with ETT clotting as well as bleeding from the subclavian line -Rate controlled  Acute on chronic diastolic CHF, essential hypertension, history of CAD -History of PCI in 2018 with in-stent restenosis, currently on aspirin and DVT prophylaxis -will reintroduce Plavix if he remains successfully extubated -Still fluid overloaded, continue Lasix  Hypernatremia -Still elevated but stable while on Lasix.  Continue  Acute kidney injury -Creatinine peaked as high as 2.39 on 04/24/2019, possibly multifactorial in the setting of diuresis and use of vancomycin -He was also found to have retention requiring Foley catheter placement -His creatinine is stable  Acute urinary obstruction -In the setting of ICU stay. Failed voiding trial on 8/20 and Foley was placed back.  Continue Flomax -Once he is more mobile and working with physical therapy will have to have a repeat voiding trial  ETT clotting/bleeding from the subclavian line -Patient was on therapeutic heparin, ET tube was clotted and exchange on 04/23/2019.  Currently on heparin prophylaxis -Hemoglobin is stable  Thrombocytopenia -Chronic, stable  Type 2 diabetes mellitus, uncontrolled with hyperglycemia with vascular disease and neuropathy at baseline -CBGs well controlled, currently still n.p.o. as he did not pass swallow eval on 8/22.  CBG (last 3)  Recent Labs    05/04/19 0106 05/04/19 0411 05/04/19 0747  GLUCAP 107* 132* 148*   RA -He is on Actemra and methotrexate at home, currently on hold  Pressure ulcer of medial sacrum bilateral buttocks Pressure Injury 04/17/19 Sacrum Medial Deep Tissue Injury - Purple or maroon localized area of discolored intact skin or blood-filled blister due to damage of underlying soft tissue from pressure and/or shear. dark purple with some skin sloughing on lowe (Active)  04/17/19 0730  Location: Sacrum  Location Orientation: Medial   Staging: Deep Tissue Injury - Purple or maroon localized area of discolored intact skin or blood-filled blister due to damage of underlying soft tissue from pressure and/or shear.  Wound Description (Comments): dark purple with some skin sloughing on lower sacral area  Present on Admission: No     Pressure Injury 04/17/19 Buttocks Right;Lower Unstageable - Full thickness tissue loss in which the base of the ulcer is covered by slough (yellow, tan, gray, green or brown) and/or eschar (tan, brown or black) in the wound bed. (Active)  04/17/19 0730  Location: Buttocks  Location Orientation: Right;Lower  Staging: Unstageable - Full thickness tissue loss in which the base of the ulcer is covered by slough (yellow, tan, gray, green or brown) and/or eschar (tan, brown or black) in the wound bed.  Wound Description (Comments):   Present on Admission: No     Pressure Injury 04/17/19 Buttocks Left;Lower Deep Tissue Injury - Purple or maroon localized area of discolored intact skin or blood-filled blister due to damage of underlying soft tissue from pressure and/or shear. (Active)  04/17/19 0730  Location: Buttocks  Location Orientation: Left;Lower  Staging: Deep Tissue Injury - Purple or maroon localized area of discolored intact skin or blood-filled blister due to damage of underlying soft tissue from pressure and/or shear.  Wound Description (Comments):   Present on Admission: No     Scheduled Meds: . albuterol  2 puff Inhalation Q6H  . aspirin  81 mg Per Tube Daily  . bethanechol  10 mg Per  Tube BID  . chlorhexidine  15 mL Mouth Rinse BID  . Chlorhexidine Gluconate Cloth  6 each Topical Daily  . feeding supplement (PRO-STAT SUGAR FREE 64)  30 mL Per Tube TID  . folic acid  1 mg Per Tube Daily  . free water  200 mL Per Tube Q4H  . furosemide  40 mg Intravenous BID  . gabapentin  100 mg Per Tube Q8H  . heparin injection (subcutaneous)  7,500 Units Subcutaneous Q8H  . insulin aspart  0-15  Units Subcutaneous Q4H  . insulin glargine  15 Units Subcutaneous Daily  . mouth rinse  15 mL Mouth Rinse q12n4p  . metoprolol tartrate  25 mg Per Tube TID  . mometasone-formoterol  2 puff Inhalation BID  . multivitamin  15 mL Per Tube Daily  . sodium chloride flush  10-40 mL Intracatheter Q12H  . tamsulosin  0.4 mg Oral QPC supper  . vitamin C  500 mg Per Tube Daily  . zinc sulfate  220 mg Per Tube Daily   Continuous Infusions: . sodium chloride 10 mL/hr at 05/04/19 0800  . feeding supplement (VITAL AF 1.2 CAL) 70 mL/hr at 05/03/19 1800   PRN Meds:.sodium chloride, acetaminophen, dextrose, fentaNYL (SUBLIMAZE) injection, hydrALAZINE, HYDROmorphone, Resource ThickenUp Clear, sodium chloride flush, Thrombi-Pad   DVT prophylaxis: heparin Code Status: Full code Family Communication: Per PCCM Disposition Plan: remain in ICU   Consultants:   PCCM  Procedures:   None   Micro data: 7/31 blood >NGTD 7/31 sars-cov-2 >positive 8/1 Resp CX > Normal flora 8/7 Resp CX >MRSA 8/7 BCX > NGTD 8/8 UCX > NGTD 8/15 Resp culture>  MRSA    Objective: Vitals:   05/04/19 0600 05/04/19 0700 05/04/19 0800 05/04/19 0838  BP: (!) 136/98 121/69 (!) 150/63 (!) 127/103  Pulse: 84 81 83 84  Resp: 12 (!) 22 (!) 22 15  Temp:   97.9 F (36.6 C)   TempSrc:   Oral   SpO2: 99% 100% 98% 100%  Weight:      Height:        Intake/Output Summary (Last 24 hours) at 05/04/2019 1101 Last data filed at 05/04/2019 0800 Gross per 24 hour  Intake 2968.44 ml  Output 3570 ml  Net -601.56 ml   Filed Weights   05/02/19 0500 05/03/19 0400 05/04/19 0400  Weight: 107.7 kg 107.4 kg 104.5 kg    Examination:  Constitutional: Very lethargic, wakes up on name and sternal rub but drifts back asleep.  Very weak voice Eyes: No scleral icterus ENMT: Moist mucous membranes Neck: Normal, supple Respiratory: Clear, no wheezing, no crackles, shallow respirations Cardiovascular: Regular rate and rhythm, no  murmurs.  No peripheral edema Abdomen: Soft, no guarding, positive bowel sounds Musculoskeletal: no clubbing / cyanosis. Skin: No new rashes Neurologic: Extremely weak throughout  Data Reviewed: I have independently reviewed following labs and imaging studies    CBC: Recent Labs  Lab 05-28-2019 0350 05/01/19 0445 05/02/19 0510 05/03/19 0435 05/04/19 0515 05/04/19 0941  WBC 9.0 7.5 6.5 7.1 7.1  --   NEUTROABS 7.7 6.0 5.0 5.7 5.7  --   HGB 9.9* 8.8* 8.9* 8.8* 9.0* 8.2*  HCT 33.0* 28.8* 28.7* 28.7* 29.1* 24.0*  MCV 100.6* 99.0 98.0 97.3 97.0  --   PLT 108* 106* 98* 102* 99*  --    Basic Metabolic Panel: Recent Labs  Lab 05/28/19 0350 2019-05-28 1200 05/01/19 0445 05/02/19 0510 05/03/19 0435 05/04/19 0515 05/04/19 0941  NA 150* 151* 148* 150* 150* 151*  151*  K 5.2* 4.6 4.3 4.5 4.4 4.1 3.9  CL 121* 123* 118* 121* 118* 118*  --   CO2 26 23 26 25 25 28   --   GLUCOSE 228* 199* 162* 113* 108* 148*  --   BUN 61* 60* 60* 62* 65* 66*  --   CREATININE 1.25* 1.16 1.18 1.27* 1.28* 1.26*  --   CALCIUM 8.6* 8.3* 8.2* 8.5* 8.7* 8.7*  --   MG 2.2  --  2.3 2.2 2.2 2.1  --    GFR: Estimated Creatinine Clearance: 72.1 mL/min (A) (by C-G formula based on SCr of 1.26 mg/dL (H)). Liver Function Tests: Recent Labs  Lab 04/30/19 0350 05/01/19 0445 05/02/19 0510 05/03/19 0435 05/04/19 0515  AST 45* 47* 47* 47* 41  ALT 96* 90* 80* 79* 72*  ALKPHOS 79 84 83 92 90  BILITOT 0.5 0.3 0.2* 0.4 0.4  PROT 5.7* 5.3* 5.5* 5.7* 5.8*  ALBUMIN 2.2* 2.1* 2.0* 2.1* 2.2*   No results for input(s): LIPASE, AMYLASE in the last 168 hours. No results for input(s): AMMONIA in the last 168 hours. Coagulation Profile: No results for input(s): INR, PROTIME in the last 168 hours. Cardiac Enzymes: No results for input(s): CKTOTAL, CKMB, CKMBINDEX, TROPONINI in the last 168 hours. BNP (last 3 results) No results for input(s): PROBNP in the last 8760 hours. HbA1C: No results for input(s): HGBA1C in the  last 72 hours. CBG: Recent Labs  Lab 05/03/19 1620 05/03/19 2043 05/04/19 0106 05/04/19 0411 05/04/19 0747  GLUCAP 136* 148* 107* 132* 148*   Lipid Profile: No results for input(s): CHOL, HDL, LDLCALC, TRIG, CHOLHDL, LDLDIRECT in the last 72 hours. Thyroid Function Tests: No results for input(s): TSH, T4TOTAL, FREET4, T3FREE, THYROIDAB in the last 72 hours. Anemia Panel: No results for input(s): VITAMINB12, FOLATE, FERRITIN, TIBC, IRON, RETICCTPCT in the last 72 hours. Urine analysis:    Component Value Date/Time   COLORURINE YELLOW 04/25/2019 0826   APPEARANCEUR HAZY (A) 04/25/2019 0826   LABSPEC 1.015 04/25/2019 0826   PHURINE 5.0 04/25/2019 0826   GLUCOSEU 150 (A) 04/25/2019 0826   HGBUR LARGE (A) 04/25/2019 0826   BILIRUBINUR NEGATIVE 04/25/2019 0826   KETONESUR NEGATIVE 04/25/2019 0826   PROTEINUR 30 (A) 04/25/2019 0826   UROBILINOGEN 0.2 12/22/2011 2130   NITRITE NEGATIVE 04/25/2019 0826   LEUKOCYTESUR NEGATIVE 04/25/2019 0826   Sepsis Labs: Invalid input(s): PROCALCITONIN, LACTICIDVEN  Recent Results (from the past 240 hour(s))  Culture, blood (routine x 2)     Status: None   Collection Time: 04/25/19  9:40 AM   Specimen: BLOOD LEFT HAND  Result Value Ref Range Status   Specimen Description   Final    BLOOD LEFT HAND Performed at [facility] [address], [place], [place] [address]    Special Requests   Final    BOTTLES DRAWN AEROBIC ONLY Blood Culture results may not be optimal due to an inadequate volume of blood received in culture bottles Performed at [facility] [address], [place], [place] [address]    Culture   Final    NO GROWTH 5 DAYS Performed at [facility], [place] [address] [place], [place] [address]    Report Status 04/30/2019 FINAL  Final  Culture, blood (routine x 2)     Status: None   Collection Time: 04/25/19  9:45 AM   Specimen: BLOOD RIGHT HAND  Result Value Ref Range Status    Specimen Description   Final  BLOOD RIGHT HAND Performed at [facility], [place] [address] [place], [place] [address]    Special Requests   Final    BOTTLES DRAWN AEROBIC ONLY Blood Culture adequate volume Performed at [facility] [address], [place], [place] [address]    Culture   Final    NO GROWTH 5 DAYS Performed at [facility], [place] [address], [place], [place] [address]    Report Status 04/30/2019 FINAL  Final  Culture, respiratory (non-expectorated)     Status: None   Collection Time: 04/26/19  8:46 AM   Specimen: Tracheal Aspirate; Respiratory  Result Value Ref Range Status   Specimen Description   Final    TRACHEAL ASPIRATE Performed at [facility] [address] [place], [place] [address]    Special Requests   Final    NONE Performed at [facility], [place] [address], [place], [place] [address]    Gram Stain   Final    FEW WBC PRESENT, PREDOMINANTLY PMN FEW GRAM POSITIVE COCCI IN CLUSTERS FEW GRAM NEGATIVE RODS RARE GRAM POSITIVE RODS RARE YEAST Performed at [facility], [place] [address] [place], [place] [address]    Culture   Final    ABUNDANT METHICILLIN RESISTANT STAPHYLOCOCCUS AUREUS   Report Status 04/29/2019 FINAL  Final   Organism ID, Bacteria METHICILLIN RESISTANT STAPHYLOCOCCUS AUREUS  Final      Susceptibility   Methicillin resistant staphylococcus aureus - MIC*    CIPROFLOXACIN <=0.5 SENSITIVE Sensitive     ERYTHROMYCIN >=8 RESISTANT Resistant     GENTAMICIN <=0.5 SENSITIVE Sensitive     OXACILLIN >=4 RESISTANT Resistant     TETRACYCLINE <=1 SENSITIVE Sensitive     VANCOMYCIN 1 SENSITIVE Sensitive     TRIMETH/SULFA <=10 SENSITIVE Sensitive     CLINDAMYCIN <=0.25 SENSITIVE Sensitive     RIFAMPIN <=0.5 SENSITIVE Sensitive     Inducible Clindamycin NEGATIVE Sensitive     * ABUNDANT METHICILLIN RESISTANT STAPHYLOCOCCUS AUREUS      Radiology Studies: No  results found. Marzetta Board, MD, PhD [facility]  Contact via  [email or web address]  [place] P: [phone] F: [phone]

## 2019-05-04 NOTE — Progress Notes (Signed)
[facility] Progress Note Patient Name: ALEPH SPARHAWK DOB: Nov 28, 1950 MRN: [id number]   Date of Service  05/04/2019  HPI/Events of Note  Hyperglycemia - Blood glucose = 132.   eICU Interventions  Will order: 1. D/C Novolog 10 units Q 4 hour tube feeding coverage.      Intervention Category Major Interventions: Hyperglycemia - active titration of insulin therapy  Lysle Dingwall 05/04/2019, 4:38 AM

## 2019-05-05 ENCOUNTER — Inpatient Hospital Stay (HOSPITAL_COMMUNITY): Payer: Medicare HMO

## 2019-05-05 LAB — CBC WITH DIFFERENTIAL/PLATELET
Abs Immature Granulocytes: 0.04 10*3/uL (ref 0.00–0.07)
Basophils Absolute: 0 10*3/uL (ref 0.0–0.1)
Basophils Relative: 1 %
Eosinophils Absolute: 0.2 10*3/uL (ref 0.0–0.5)
Eosinophils Relative: 4 %
HCT: 28 % — ABNORMAL LOW (ref 39.0–52.0)
Hemoglobin: 8.8 g/dL — ABNORMAL LOW (ref 13.0–17.0)
Immature Granulocytes: 1 %
Lymphocytes Relative: 12 %
Lymphs Abs: 0.8 10*3/uL (ref 0.7–4.0)
MCH: 30.2 pg (ref 26.0–34.0)
MCHC: 31.4 g/dL (ref 30.0–36.0)
MCV: 96.2 fL (ref 80.0–100.0)
Monocytes Absolute: 0.3 10*3/uL (ref 0.1–1.0)
Monocytes Relative: 5 %
Neutro Abs: 5.1 10*3/uL (ref 1.7–7.7)
Neutrophils Relative %: 77 %
Platelets: 92 10*3/uL — ABNORMAL LOW (ref 150–400)
RBC: 2.91 MIL/uL — ABNORMAL LOW (ref 4.22–5.81)
RDW: 17.6 % — ABNORMAL HIGH (ref 11.5–15.5)
WBC: 6.6 10*3/uL (ref 4.0–10.5)
nRBC: 0 % (ref 0.0–0.2)

## 2019-05-05 LAB — COMPREHENSIVE METABOLIC PANEL
ALT: 62 U/L — ABNORMAL HIGH (ref 0–44)
AST: 29 U/L (ref 15–41)
Albumin: 2.2 g/dL — ABNORMAL LOW (ref 3.5–5.0)
Alkaline Phosphatase: 94 U/L (ref 38–126)
Anion gap: 7 (ref 5–15)
BUN: 68 mg/dL — ABNORMAL HIGH (ref 8–23)
CO2: 27 mmol/L (ref 22–32)
Calcium: 8.7 mg/dL — ABNORMAL LOW (ref 8.9–10.3)
Chloride: 115 mmol/L — ABNORMAL HIGH (ref 98–111)
Creatinine, Ser: 1.22 mg/dL (ref 0.61–1.24)
GFR calc Af Amer: >60 mL/min (ref >60)
GFR calc non Af Amer: >60 mL/min (ref >60)
Glucose, Bld: 222 mg/dL — ABNORMAL HIGH (ref 70–99)
Potassium: 4.1 mmol/L (ref 3.5–5.1)
Sodium: 149 mmol/L — ABNORMAL HIGH (ref 135–145)
Total Bilirubin: 0.3 mg/dL (ref 0.3–1.2)
Total Protein: 5.7 g/dL — ABNORMAL LOW (ref 6.5–8.1)

## 2019-05-05 LAB — GLUCOSE, CAPILLARY
Glucose-Capillary: 205 mg/dL — ABNORMAL HIGH (ref 70–99)
Glucose-Capillary: 186 mg/dL — ABNORMAL HIGH (ref 70–99)
Glucose-Capillary: 224 mg/dL — ABNORMAL HIGH (ref 70–99)
Glucose-Capillary: 265 mg/dL — ABNORMAL HIGH (ref 70–99)
Glucose-Capillary: 305 mg/dL — ABNORMAL HIGH (ref 70–99)

## 2019-05-05 LAB — D-DIMER, QUANTITATIVE: D-Dimer, Quant: 2.51 ug/mL-FEU — ABNORMAL HIGH (ref 0.00–0.50)

## 2019-05-05 LAB — MAGNESIUM: Magnesium: 2.1 mg/dL (ref 1.7–2.4)

## 2019-05-05 LAB — C-REACTIVE PROTEIN: CRP: 3.8 mg/dL — ABNORMAL HIGH (ref <1.0)

## 2019-05-05 MED ORDER — ADULT MULTIVITAMIN LIQUID CH: 15.0000 mL | Freq: Every day | ORAL | Status: DC

## 2019-05-05 MED ORDER — GABAPENTIN 250 MG/5ML PO SOLN
100.0000 mg | Freq: Three times a day (TID) | ORAL | Status: DC
  Administered 2019-05-05 – 2019-05-16 (×32): 100 mg via ORAL
  Filled 2019-05-05 (×38): qty 2

## 2019-05-05 MED ORDER — ADULT MULTIVITAMIN W/MINERALS CH
1.0000 | ORAL_TABLET | Freq: Every day | ORAL | Status: DC
  Administered 2019-05-06 – 2019-05-22 (×15): 1 via ORAL
  Filled 2019-05-05 (×16): qty 1

## 2019-05-05 MED ORDER — ENSURE ENLIVE PO LIQD
237.0000 mL | Freq: Three times a day (TID) | ORAL | Status: DC
  Administered 2019-05-05 – 2019-05-22 (×38): 237 mL via ORAL
  Filled 2019-05-05 (×24): qty 237

## 2019-05-05 MED ORDER — TAMSULOSIN HCL 0.4 MG PO CAPS
0.8000 mg | ORAL_CAPSULE | Freq: Every day | ORAL | Status: DC
  Administered 2019-05-05 – 2019-05-22 (×17): 0.8 mg via ORAL
  Filled 2019-05-05 (×19): qty 2

## 2019-05-05 MED ORDER — ENOXAPARIN SODIUM 60 MG/0.6ML ~~LOC~~ SOLN
50.0000 mg | SUBCUTANEOUS | Status: DC
  Administered 2019-05-05 – 2019-05-07 (×3): 50 mg via SUBCUTANEOUS
  Filled 2019-05-05 (×3): qty 0.6

## 2019-05-05 MED ORDER — ASPIRIN 81 MG PO CHEW
81.0000 mg | CHEWABLE_TABLET | Freq: Every day | ORAL | Status: DC
  Administered 2019-05-06 – 2019-05-13 (×8): 81 mg via ORAL
  Filled 2019-05-05 (×8): qty 1

## 2019-05-05 MED ORDER — METOPROLOL TARTRATE 25 MG/10 ML ORAL SUSPENSION
25.0000 mg | Freq: Three times a day (TID) | ORAL | Status: DC
  Administered 2019-05-05 – 2019-05-13 (×26): 25 mg via ORAL
  Filled 2019-05-05 (×29): qty 10

## 2019-05-05 MED ORDER — CLOPIDOGREL BISULFATE 75 MG PO TABS
75.0000 mg | ORAL_TABLET | Freq: Every day | ORAL | Status: DC
  Administered 2019-05-06 – 2019-05-22 (×15): 75 mg via ORAL
  Filled 2019-05-05 (×16): qty 1

## 2019-05-05 MED ORDER — FOLIC ACID 1 MG PO TABS
1.0000 mg | ORAL_TABLET | Freq: Every day | ORAL | Status: DC
  Administered 2019-05-06 – 2019-05-13 (×8): 1 mg via ORAL
  Filled 2019-05-05 (×8): qty 1

## 2019-05-05 MED ORDER — BETHANECHOL CHLORIDE 10 MG PO TABS
10.0000 mg | ORAL_TABLET | Freq: Two times a day (BID) | ORAL | Status: DC
  Administered 2019-05-05 – 2019-05-22 (×32): 10 mg via ORAL
  Filled 2019-05-05 (×40): qty 1

## 2019-05-05 NOTE — Progress Notes (Signed)
Rehab Admissions Coordinator Note:  Per OT recommendation, this patient was screened by [name] for appropriateness for an Inpatient Acute Rehab Consult.    Note that pt's most recent (+) test was on 7/31.  Will continue to follow at a distance until patient meets current CDC guidelines for discharge to a facility.   [name] 05/05/2019, 4:55 PM  I can be reached at [phone].

## 2019-05-05 NOTE — Progress Notes (Signed)
  Speech Language Pathology Treatment: Dysphagia  Patient Details Name: Robert [name] MRN: [id number] DOB: 1951-04-10 Today's Date: 05/05/2019 Time: [id number] SLP Time Calculation (min) (ACUTE ONLY): 23 min  Assessment / Plan / Recommendation Clinical Impression  Pt demonstrates much improved vocal quality, is fully alert, desires PO, off supplemental oxygen. Pt has been tolerating honey thick teaspoons with RN, but when given self administered sips of thin or honey thick water he continues to demonstrate immediate coughing. Will proceed with MBS in order to advance diet as pt is planning to transition to a stepdown room today.   HPI HPI: 68 year old male with history of COPD, CAD, diabetes mellitus, ischemic cardiomyopathy, RA, presented to the hospital and was admitted on 04/11/2019 with shortness of breath, he was found to be significantly hypoxic, tested positive for COVID-19, was intubated and transferred to [facility] for further management. Intubated 8/1-8/2, reintubated 8/3-8/22.       SLP Plan  MBS;Continue with current plan of care       Recommendations  Diet recommendations: Honey-thick liquid                Oral Care Recommendations: Oral care BID Follow up Recommendations: Inpatient Rehab SLP Visit Diagnosis: Dysphagia, oropharyngeal phase (R13.12) Plan: MBS;Continue with current plan of care       GO               [name], MA [place] Pager [phone] Office [phone]  [name] 05/05/2019, 9:53 AM

## 2019-05-05 NOTE — Progress Notes (Signed)
Patient transferred to progressive Rm 156. Cental tele called and made aware. Patient taken to room via RN and bed with all his personal belongings.

## 2019-05-05 NOTE — Progress Notes (Signed)
Pt is being transferred out of the ICU today. His d-dimer has been <5. Ok to change over to moderate dose lovenox due to his wt per Dr. Cruzita Lederer.  Dc SQ heparin Lovenox 50mg  SQ qday  Onnie Boer, PharmD, BCIDP, AAHIVP, CPP Infectious Disease Pharmacist 05/05/2019 10:40 AM

## 2019-05-05 NOTE — Progress Notes (Signed)
Patient has had in and out catheterizations X3 since foley was removed 05/04/2019. Current bladder scan volume reveals greater than 999 ml.  Due to patient being on lV Lasix and requiring frequent I&O (Q4-5h), I discussed with Dr. Cruzita Lederer if he is okay with [place] doing I&O more frequently or if we should put a foley back in. At this time Cruzita Lederer says to put the foley back in and reassess in a couple of days after patient works more with PT.   Foley catheter reinserted with a second RN witness, sterile technique performed.

## 2019-05-05 NOTE — Progress Notes (Signed)
PROGRESS NOTE  Robert [name] [id number] DOB: 1951/03/30 DOA: 04/11/2019 PCP: Redmond School, MD   LOS: 23 days   Brief Narrative / Interim history: 68 year old male with history of COPD, CAD, diabetes mellitus, ischemic cardiomyopathy, RA, presented to the hospital and was admitted on 04/11/2019 with shortness of breath, he was found to be significantly hypoxic, tested positive for COVID-19, was intubated and transferred to [facility] for further management.  Hospital course complicated by persistent encephalopathy and difficulty weaning due to tachypnea and agitation, he failed extubation once on 8/2.  Hospital course was also complicated by MRSA pneumonia with septic shock at the beginning of August status post completed treatment. Patient eventually was eventually successfully extubated on 8/22 and transferred to the floor on 8/24  Significant events: 8/1 admission to [facility] ICU 8/2 extubated 8/3 early AM agitated, placed on BIPAP, required re-intubation 8/4 started on steroids for COPD exacerbation 8/8 septic shock requiring pressor support 8/9 off pressors, respiratory cultures +s.aureus 8/10 persistent encephalopathy off sedation, CT head ordered 8/11 encephalopathy improved 8/12 difficulty weaning due to tachypnea, ETT exchanged due to clot burden 8/13 PICC line inserted and subclavian central line removed 8/14 Antibiotics coverage broaden due to persistent fever and resp distress, pan cultured  8/19 more alert, tolerating pressure support 8/20 failed voiding trial, Foley needed to be reinserted 8/22 successfully extubated  8/24 transfer to the floor  Subjective: Alert, appropriate, denies any shortness of breath, complains of generalized weakness but otherwise he is feeling well.  Assessment & Plan: Principal Problem:   Pneumonia due to COVID-19 virus Active Problems:   Rheumatoid arthritis ([place])   Hyperlipidemia   COPD (chronic obstructive pulmonary disease) (HCC)   Controlled  type 2 diabetes mellitus with diabetic peripheral angiopathy without gangrene, with long-term current use of insulin (HCC)   Essential hypertension   Ischemic cardiomyopathy   Coronary artery disease   Dyslipidemia (high LDL; low HDL)   Acute respiratory failure with hypoxemia (HCC)   COVID-19 virus detected   Acute respiratory distress syndrome (ARDS) due to COVID-19 virus   Atrial fibrillation with RVR (HCC)   COPD (chronic obstructive pulmonary disease) with emphysema (HCC)   AKI (acute kidney injury) ([place])   Diabetes mellitus type 2, uncontrolled, with complications (HCC)   Pressure injury of skin   Staphylococcal pneumonia ([place])   HCAP (healthcare-associated pneumonia)   Principal Problem Acute Hypoxic Respiratory Failure due to Covid-19 Viral Illness, COPD exacerbation -Extubated on 8/22, seems to be stable from respiratory standpoint and currently he is on room air -Continue his inhalers for COPD, no longer wheezing -Stable to transfer out of the ICU and onto the floor  Fever: Temp (24hrs), Avg:98.1 F (36.7 C), Min:97.9 F (36.6 C), Max:98.1 F (36.7 C)  Antibiotics:  Cefepime 7/31 >8/4  Flagyl 7/31  Remdesivir 8/1 >8/5  Actemra 8/1  Decadron/solumedrol 8/1 >8/8  Zosyn 8/8>8/9 Azithro 8/7>8/9 Cefepime 8/14 >8/19 Vanc 7/31, 8/8>8/22  Remdesivir: 8/1 >> 8/5 Steroids: decadron / solumedrol 8/1 >> 8/5 Actemra: 8/1 Convalescent Plasma: none  Vitamin C and Zinc: yes   COVID-19 Labs  Recent Labs    05/03/19 0435 05/04/19 0515 05/05/19 0455  DDIMER 2.73* 3.03* 2.51*  CRP 1.8* 2.1* 3.8*    Lab Results  Component Value Date   SARSCOV2NAA POSITIVE (A) 04/11/2019   SARSCOV2NAA Detected (A) 04/11/2019    Active Problems MRSA HCAP /septic shock -Off pressors now -Completed treatment with vancomycin for 3 weeks, last day was 8/22 -Respiratory status improving, extubated on 8/22 AM,  currently on room air  Paroxysmal A. fib with RVR -Currently in  sinus, on metoprolol 25mg  3 times daily -He was on IV heparin but no anticoagulation is on hold due to bleeding with ETT clotting as well as bleeding from the subclavian line -Personally reviewed the saved telemetry strips in the computer and EKGs, I am not seeing any runs of A. fib, suspect may have been very short in the setting of acute illness and has not recurred.  Will need anticoagulation if A. fib recurs.  Acute on chronic diastolic CHF, essential hypertension, history of CAD -History of PCI in 2018 with in-stent restenosis, currently on aspirin and DVT prophylaxis -Restart Plavix once passes the speech eval -Still fluid overloaded, continue Lasix.  Closely monitor creatinine, if BUN/Cr increase tomorrow we will transition to p.o.  Deconditioning/ICU induced muscle weakness -MBS to evaluate per SLP pending, PT to evaluate as well.  He is extremely weak and deconditioned.  Suspect may need SNF/CIR prior to discharge home  Hypernatremia -Still elevated but stable while on Lasix.  Continue  Acute kidney injury -Creatinine peaked as high as 2.39 on 04/24/2019, possibly multifactorial in the setting of diuresis and use of vancomycin -He was also found to have retention requiring Foley catheter placement. -continue to monitor his creatinine, overall stable  Acute urinary obstruction -In the setting of ICU stay. Failed voiding trial on 8/20 and Foley had to be placed back.  Continue Flomax -Once he is more mobile and working with physical therapy will have to have a repeat voiding trial  ETT clotting/bleeding from the subclavian line -Patient was on therapeutic heparin, ET tube was clotted and exchange on 04/23/2019.  Currently on heparin prophylaxis -Hemoglobin stable  Thrombocytopenia -Chronic, stable  Type 2 diabetes mellitus, uncontrolled with hyperglycemia with vascular disease and neuropathy at baseline -CBGs well controlled, currently still n.p.o. as he did not pass swallow eval  on 8/22, MBS pending  CBG (last 3)  Recent Labs    05/04/19 2346 05/05/19 0438 05/05/19 0738  GLUCAP 201* 205* 186*   RA -He is on Actemra and methotrexate at home, currently on hold  Pressure ulcer of medial sacrum bilateral buttocks Pressure Injury 04/17/19 Sacrum Medial Deep Tissue Injury - Purple or maroon localized area of discolored intact skin or blood-filled blister due to damage of underlying soft tissue from pressure and/or shear. dark purple with some skin sloughing on lowe (Active)  04/17/19 0730  Location: Sacrum  Location Orientation: Medial  Staging: Deep Tissue Injury - Purple or maroon localized area of discolored intact skin or blood-filled blister due to damage of underlying soft tissue from pressure and/or shear.  Wound Description (Comments): dark purple with some skin sloughing on lower sacral area  Present on Admission: No     Pressure Injury 04/17/19 Buttocks Right;Lower Unstageable - Full thickness tissue loss in which the base of the ulcer is covered by slough (yellow, tan, gray, green or brown) and/or eschar (tan, brown or black) in the wound bed. (Active)  04/17/19 0730  Location: Buttocks  Location Orientation: Right;Lower  Staging: Unstageable - Full thickness tissue loss in which the base of the ulcer is covered by slough (yellow, tan, gray, green or brown) and/or eschar (tan, brown or black) in the wound bed.  Wound Description (Comments):   Present on Admission: No     Pressure Injury 04/17/19 Buttocks Left;Lower Deep Tissue Injury - Purple or maroon localized area of discolored intact skin or blood-filled blister due to damage of underlying  soft tissue from pressure and/or shear. (Active)  04/17/19 0730  Location: Buttocks  Location Orientation: Left;Lower  Staging: Deep Tissue Injury - Purple or maroon localized area of discolored intact skin or blood-filled blister due to damage of underlying soft tissue from pressure and/or shear.  Wound  Description (Comments):   Present on Admission: No     Scheduled Meds: . albuterol  2 puff Inhalation Q6H  . aspirin  81 mg Per Tube Daily  . bethanechol  10 mg Per Tube BID  . chlorhexidine  15 mL Mouth Rinse BID  . Chlorhexidine Gluconate Cloth  6 each Topical Daily  . enoxaparin (LOVENOX) injection  50 mg Subcutaneous Q24H  . feeding supplement (PRO-STAT SUGAR FREE 64)  30 mL Per Tube TID  . folic acid  1 mg Per Tube Daily  . free water  200 mL Per Tube Q4H  . furosemide  40 mg Intravenous BID  . gabapentin  100 mg Per Tube Q8H  . insulin aspart  0-15 Units Subcutaneous Q4H  . insulin glargine  15 Units Subcutaneous Daily  . mouth rinse  15 mL Mouth Rinse q12n4p  . metoprolol tartrate  25 mg Per Tube TID  . mometasone-formoterol  2 puff Inhalation BID  . multivitamin  15 mL Per Tube Daily  . sodium chloride flush  10-40 mL Intracatheter Q12H  . tamsulosin  0.4 mg Oral QPC supper  . vitamin C  500 mg Per Tube Daily  . zinc sulfate  220 mg Per Tube Daily   Continuous Infusions: . sodium chloride 10 mL/hr at 05/05/19 0600  . feeding supplement (VITAL AF 1.2 CAL) 1,000 mL (05/05/19 0545)   PRN Meds:.sodium chloride, acetaminophen, dextrose, fentaNYL (SUBLIMAZE) injection, hydrALAZINE, HYDROmorphone, Resource ThickenUp Clear, sodium chloride flush, Thrombi-Pad   DVT prophylaxis: Lovenox Code Status: Full code Family Communication: Discussed with daughter over the phone 8/24 Disposition Plan: Transfer to progressive  Consultants:   PCCM  Procedures:   None   Micro data: 7/31 blood >NGTD 7/31 sars-cov-2 >positive 8/1 Resp CX > Normal flora 8/7 Resp CX >MRSA 8/7 BCX > NGTD 8/8 UCX > NGTD 8/15 Resp culture>  MRSA    Objective: Vitals:   05/05/19 0400 05/05/19 0500 05/05/19 0600 05/05/19 0831  BP: (!) 91/55 (!) 121/59 120/62   Pulse: 74 81 77 81  Resp: 13 (!) 25 (!) 22 (!) 24  Temp: 98.1 F (36.7 C)     TempSrc: Oral     SpO2: 94% 94% 93% 92%  Weight:       Height:        Intake/Output Summary (Last 24 hours) at 05/05/2019 1059 Last data filed at 05/05/2019 0600 Gross per 24 hour  Intake 3512.84 ml  Output 3755 ml  Net -242.16 ml   Filed Weights   05/03/19 0400 05/04/19 0400 05/05/19 0300  Weight: 107.4 kg 104.5 kg 104.2 kg    Examination:  Constitutional: No distress, laying in bed, overall weak but comfortable. Eyes: No scleral icterus ENMT: Moist mucous membranes Neck: Normal, supple Respiratory: Clear to auscultation bilaterally, slightly diminished at the bases but no wheezing, no crackles.  Good air movement Cardiovascular: Regular rate and rhythm, no murmurs appreciated.  Trace edema Abdomen: Soft, nontender, nondistended, bowel sounds positive Musculoskeletal: no clubbing / cyanosis. Skin: No new rashes Neurologic: Nonfocal, overall weak but equal strength.  Data Reviewed: I have independently reviewed following labs and imaging studies    CBC: Recent Labs  Lab 05-15-2019 0445 05/02/19 0510 05/03/19  [id number] 05/04/19 0515 05/04/19 0941 05/05/19 0455  WBC 7.5 6.5 7.1 7.1  --  6.6  NEUTROABS 6.0 5.0 5.7 5.7  --  5.1  HGB 8.8* 8.9* 8.8* 9.0* 8.2* 8.8*  HCT 28.8* 28.7* 28.7* 29.1* 24.0* 28.0*  MCV 99.0 98.0 97.3 97.0  --  96.2  PLT 106* 98* 102* 99*  --  92*   Basic Metabolic Panel: Recent Labs  Lab 05/01/19 0445 05/02/19 0510 05/03/19 0435 05/04/19 0515 05/04/19 0941 05/05/19 0455  NA 148* 150* 150* 151* 151* 149*  K 4.3 4.5 4.4 4.1 3.9 4.1  CL 118* 121* 118* 118*  --  115*  CO2 26 25 25 28   --  27  GLUCOSE 162* 113* 108* 148*  --  222*  BUN 60* 62* 65* 66*  --  68*  CREATININE 1.18 1.27* 1.28* 1.26*  --  1.22  CALCIUM 8.2* 8.5* 8.7* 8.7*  --  8.7*  MG 2.3 2.2 2.2 2.1  --  2.1   GFR: Estimated Creatinine Clearance: 74.3 mL/min (by C-G formula based on SCr of 1.22 mg/dL). Liver Function Tests: Recent Labs  Lab 05/01/19 0445 05/02/19 0510 05/03/19 0435 05/04/19 0515 05/05/19 0455  AST 47* 47* 47*  41 29  ALT 90* 80* 79* 72* 62*  ALKPHOS 84 83 92 90 94  BILITOT 0.3 0.2* 0.4 0.4 0.3  PROT 5.3* 5.5* 5.7* 5.8* 5.7*  ALBUMIN 2.1* 2.0* 2.1* 2.2* 2.2*   No results for input(s): LIPASE, AMYLASE in the last 168 hours. Recent Labs  Lab 05/04/19 1043  AMMONIA 22   Coagulation Profile: No results for input(s): INR, PROTIME in the last 168 hours. Cardiac Enzymes: No results for input(s): CKTOTAL, CKMB, CKMBINDEX, TROPONINI in the last 168 hours. BNP (last 3 results) No results for input(s): PROBNP in the last 8760 hours. HbA1C: No results for input(s): HGBA1C in the last 72 hours. CBG: Recent Labs  Lab 05/04/19 1649 05/04/19 1953 05/04/19 2346 05/05/19 0438 05/05/19 0738  GLUCAP 210* 185* 201* 205* 186*   Lipid Profile: No results for input(s): CHOL, HDL, LDLCALC, TRIG, CHOLHDL, LDLDIRECT in the last 72 hours. Thyroid Function Tests: No results for input(s): TSH, T4TOTAL, FREET4, T3FREE, THYROIDAB in the last 72 hours. Anemia Panel: No results for input(s): VITAMINB12, FOLATE, FERRITIN, TIBC, IRON, RETICCTPCT in the last 72 hours. Urine analysis:    Component Value Date/Time   COLORURINE YELLOW 04/25/2019 0826   APPEARANCEUR HAZY (A) 04/25/2019 0826   LABSPEC 1.015 04/25/2019 0826   PHURINE 5.0 04/25/2019 0826   GLUCOSEU 150 (A) 04/25/2019 0826   HGBUR LARGE (A) 04/25/2019 0826   BILIRUBINUR NEGATIVE 04/25/2019 0826   KETONESUR NEGATIVE 04/25/2019 0826   PROTEINUR 30 (A) 04/25/2019 0826   UROBILINOGEN 0.2 12/22/2011 2130   NITRITE NEGATIVE 04/25/2019 0826   LEUKOCYTESUR NEGATIVE 04/25/2019 0826   Sepsis Labs: Invalid input(s): PROCALCITONIN, LACTICIDVEN  Recent Results (from the past 240 hour(s))  Culture, respiratory (non-expectorated)     Status: None   Collection Time: 04/26/19  8:46 AM   Specimen: Tracheal Aspirate; Respiratory  Result Value Ref Range Status   Specimen Description   Final    TRACHEAL ASPIRATE Performed at [facility] [address] [place], [place] [address]    Special Requests   Final    NONE Performed at [facility], [place] [address] [place], [place] [address]    Gram Stain   Final    FEW WBC PRESENT, PREDOMINANTLY PMN FEW GRAM POSITIVE COCCI  IN CLUSTERS FEW GRAM NEGATIVE RODS RARE GRAM POSITIVE RODS RARE YEAST Performed at [facility], [place] [address], [place], [place] [address]    Culture   Final    ABUNDANT METHICILLIN RESISTANT STAPHYLOCOCCUS AUREUS   Report Status 04/29/2019 FINAL  Final   Organism ID, Bacteria METHICILLIN RESISTANT STAPHYLOCOCCUS AUREUS  Final      Susceptibility   Methicillin resistant staphylococcus aureus - MIC*    CIPROFLOXACIN <=0.5 SENSITIVE Sensitive     ERYTHROMYCIN >=8 RESISTANT Resistant     GENTAMICIN <=0.5 SENSITIVE Sensitive     OXACILLIN >=4 RESISTANT Resistant     TETRACYCLINE <=1 SENSITIVE Sensitive     VANCOMYCIN 1 SENSITIVE Sensitive     TRIMETH/SULFA <=10 SENSITIVE Sensitive     CLINDAMYCIN <=0.25 SENSITIVE Sensitive     RIFAMPIN <=0.5 SENSITIVE Sensitive     Inducible Clindamycin NEGATIVE Sensitive     * ABUNDANT METHICILLIN RESISTANT STAPHYLOCOCCUS AUREUS     Radiology Studies: No results found.    Marzetta Board, MD, PhD [facility]  Contact via  [email or web address]  [place] P: [phone] F: [phone]

## 2019-05-05 NOTE — Progress Notes (Signed)
Pt arrived from ICU 1850. No Signs of acute distress.

## 2019-05-05 NOTE — Progress Notes (Addendum)
Nutrition Follow-up  DOCUMENTATION CODES:   Obesity unspecified  INTERVENTION:   Ensure Enlive po TID, each supplement provides 350 kcal and 20 grams of protein  Pt receiving Hormel Shake daily with Breakfast which provides 520 kcals and 22 g of protein and Magic cup BID with lunch and dinner, each supplement provides 290 kcal and 9 grams of protein, automatically on meal trays to optimize nutritional intake.   Encourage PO intake   NUTRITION DIAGNOSIS:   Inadequate oral intake related to inability to eat as evidenced by NPO status.  Progressing   GOAL:   Patient will meet greater than or equal to 90% of their needs  Progressing   MONITOR:   PO intake, Supplement acceptance, Diet advancement, Skin  ASSESSMENT:   68 yo male admitted with ARDS from COVID-19 pneumonia. PMH includes HLD, anxiety, ischemic cardiomyopathy, CAD, COPD, RA, HTN, DM-on insulin.  8/2 - 8/22 intubated 8/24 pt on room air, passed MBS just ordered Dysphagia 3 with nectar thick liquids, Cortrak removed by RN for [facility] Per MD notes pt is very deconditioned   Labs reviewed. Sodium 149 (H) CBG's: 201-205-186  Medications reviewed and include folic acid, lasix,  novolog, lantus, MVI, flomax  Weight stable.  Diet Order:   Diet Order            DIET DYS 3 Room service appropriate? Yes; Fluid consistency: Nectar Thick  Diet effective now              EDUCATION NEEDS:   No education needs have been identified at this time  Skin:  Skin Assessment: Skin Integrity Issues: Skin Integrity Issues:: DTI, Unstageable DTI: sacrum, buttocks Unstageable: buttocks  Last BM:  8/23 rectal tube  Height:   Ht Readings from Last 1 Encounters:  05/02/19 6' (1.829 m)    Weight:   Wt Readings from Last 1 Encounters:  05/05/19 104.2 kg    Ideal Body Weight:  80.9 kg  BMI:  Body mass index is 31.16 kg/m.  Estimated Nutritional Needs:   Kcal:  2400-2600  Protein:  130-160 grams  Fluid:  2  L  [place], LDN, CNSC [phone] Pager [phone] After Hours Pager

## 2019-05-05 NOTE — Progress Notes (Signed)
Update given to patient's daughter, Robert [name] via the patient's telephone. All questions were answered. Kim asked if she can bring the patient some denture cream later today. I told her I would have to check and see if that is allowed.

## 2019-05-05 NOTE — Progress Notes (Signed)
Modified Barium Swallow Progress Note  Patient Details  Name: Robert [name] MRN: [id number] Date of Birth: 08-07-1951  Today's Date: 05/05/2019  Modified Barium Swallow completed.  Full report located under Chart Review in the Imaging Section.  Brief recommendations include the following:  Clinical Impression  Pt demonstrates a moderate pharyngeal dysphagia with primary deficits associated with laryngeal closure and pharyngeal strength. Pt initially tested with Cortrak in place, and then removed mid test. Pt demonstrates decreased vestibular closure resulting in consistent penetration across consistencies. With thin liquid, there was also mild sensed aspiration (without ejection) as glottic closure released while significant penetrates were still present. Performance was similar between honey thick liquids and nectar, though nectar residue transited a bit easier. In these instances, even with consecutive swallows with large boluses pt penetrated to the cords and had mild to moderate vallecular residue post swallow. Subsequent swallows and throat clears (sensed but cueing needed for appropriate effort) ejected penetrate and cleared residue. Again, this also improved somewhat after Cortrak removal. A chin tuck was attempted but provided no significant benefit. Pt is recommended to initaite a dys 3 (mech soft) diet with nectar thick liquids with reminders for intermittent throat clearing and second swallows. Expect function to improve as vocal quality also improves but RMT may be beneficial for pharyngeal and respiratory strength.    Swallow Evaluation Recommendations       SLP Diet Recommendations: Dysphagia 3 (Mech soft) solids;Nectar thick liquid   Liquid Administration via: Cup;Straw   Medication Administration: Whole meds with liquid   Supervision: Patient able to self feed   Compensations: Multiple dry swallows after each bite/sip;Clear throat intermittently   Postural Changes: Seated  upright at 90 degrees;Remain semi-upright after after feeds/meals (Comment)           Robert Baltimore, MA CCC-SLP  Acute Rehabilitation Services Pager [phone] Office [phone]  [name] 05/05/2019,11:51 AM

## 2019-05-05 NOTE — Progress Notes (Signed)
Wound care consult placed. Patient has black un-stagable pressure ulcer to left buttocks. Right buttocks wound is pink and inner fissure wound is a stage II. Need wound care orders.

## 2019-05-05 NOTE — TOC Progression Note (Addendum)
Transition of Care [facility]) - Progression Note    Patient Details  Name: GARRICK [name] MRN: [id number] Date of Birth: 02/25/1951  Transition of Care [facility]) CM/SW Contact  Maryclare Labrador, RN Phone Number: 05/05/2019, 11:57 AM  Clinical Narrative:   Pt is now extubated - however lethargic. NG tube removed today to perform swallow eval - pt passed swallow study this am.  Pt will need 2 negative COVID test before being considered for CIR.  Pt will need NG discontinued and tolerating adequate PO diet  prior to being considered for SNF.  Pt also on IV lasix today.       Barriers to Discharge: Other (comment)(COVID positive and continued medical workup)  Expected Discharge Plan and Services         Living arrangements for the past 2 months: Single Family Home                                       Social Determinants of Health (SDOH) Interventions    Readmission Risk Interventions No flowsheet data found.

## 2019-05-05 NOTE — Progress Notes (Signed)
Occupational Therapy Evaluation Patient Details Name: Robert [name] MRN: [id number] DOB: [birth year] Today's Date: 05/05/2019    History of Present Illness 68 yo male [place] HTN,/CAD,GERD, DM,RA, T3 compression fx, wrist fx, COPD,ischemic myopathy presented to the hospital and was admitted on 04/11/2019 with shortness of breath, he was found to be significantly hypoxic, tested positive for COVID-19, was intubated and transferred to [facility] for further management   Clinical Impression   PTA, pt lived with his "special needs step son" and was independent with ADL and mobility. Pt currently requires Max A +2 with bed mobility and ADL tasks. Able to dangle EOB with VSS x 15 min. Session completed on 3L with SpO2 @ 89; BP 119/55 supine before session; 146/68 sitting after activity. Pt will benefit from acute OT due to deficits listed below. Pt with scrotal edema and may benefit from use of a scrotal sling to reduce pain/discomfort during mobility - will further assess. Recommend rehab at [place].     Follow Up Recommendations  CIR;Supervision/Assistance - 24 hour    Equipment Recommendations  3 in 1 bedside commode    Recommendations for Other Services Rehab consult     Precautions / Restrictions Precautions Precautions: Fall Precaution Comments:  significant  scrotal edema Required Braces or Orthoses: Other Brace Other Brace: B Prevalon boots      Mobility Bed Mobility Overal bed mobility: Needs Assistance Bed Mobility: Rolling;Sidelying to Sit;Sit to Sidelying Rolling: +2 for physical assistance;Max assist Sidelying to sit: Max assist;+2 for physical assistance     Sit to sidelying: Total assist;+2 for physical assistance General bed mobility comments: attempting to help by using rail  Transfers                 General transfer comment: NA this session    Balance                                           ADL either performed or assessed with clinical  judgement   ADL Overall ADL's : Needs assistance/impaired Eating/Feeding: Moderate assistance;Cueing for safety Eating/Feeding Details (indicate cue type and reason): honey thickened  liquid; difficulty with managing utensils Grooming: Moderate assistance;Wash/dry face;Wash/dry hands Grooming Details (indicate cue type and reason): difficulty reaching hair Upper Body Bathing: Moderate assistance;Sitting   Lower Body Bathing: Maximal assistance;Sitting/lateral leans;Bed level   Upper Body Dressing : Maximal assistance;Sitting   Lower Body Dressing: Total assistance;Bed level               Functional mobility during ADLs: (NA this session)       Vision         Perception     Praxis      Pertinent Vitals/Pain Pain Assessment: Faces Faces Pain Scale: Hurts even more Pain Location: hips with mobility; penis and scrotal area Pain Descriptors / Indicators: Discomfort;Grimacing Pain Intervention(s): Limited activity within patient's tolerance;Repositioned     Hand Dominance Right   Extremity/Trunk Assessment Upper Extremity Assessment Upper Extremity Assessment: Generalized weakness;RUE deficits/detail;LUE deficits/detail RUE Deficits / Details: hand,elbow, wrist @ 3/5; shoulder @ 2/5 with weaker shoulder flexors and stronger abductors RUE Coordination: decreased fine motor;decreased gross motor LUE Deficits / Details: PROM WFL; hand/wrist/elbow @ 3/5; shoulder @ 2/5; weaker shoulder flexors   Lower Extremity Assessment Lower Extremity Assessment: Defer to PT evaluation   Cervical / Trunk Assessment Cervical / Trunk Assessment: Kyphotic;Other exceptions(forward head)  Communication Communication Communication: No difficulties   Cognition Arousal/Alertness: Awake/alert Behavior During Therapy: WFL for tasks assessed/performed Overall Cognitive Status: Impaired/Different from baseline Area of Impairment: Attention;Awareness;Problem solving                    Current Attention Level: Selective       Awareness: Emergent Problem Solving: Slow processing General Comments: will further assess; poor awareness of body in space; increased processing time   General Comments       Exercises Exercises: General Upper Extremity General Exercises - Upper Extremity Shoulder Flexion: Both;10 reps;Supine;AAROM;AROM Shoulder ABduction: AAROM;AROM;10 reps;Supine Elbow Flexion: AROM;AAROM;Both;10 reps;Supine Elbow Extension: AROM;AAROM;Both;10 reps;Supine Digit Composite Flexion: AROM;Strengthening;Both;10 reps;Supine Composite Extension: AROM;Strengthening;Both;10 reps;Supine   Shoulder Instructions      Home Living Family/patient expects to be discharged to:: Private residence Living Arrangements: Spouse/significant other;Children Available Help at Discharge: Family Type of Home: House Home Access: Stairs to enter Technical brewer of Steps: 3 Entrance Stairs-Rails: Left Home Layout: Multi-level;Able to live on main level with bedroom/bathroom     Bathroom Shower/Tub: Teacher, early years/pre: Handicapped height                Prior Functioning/Environment Level of Independence: Independent                 OT Problem List: Decreased strength;Decreased activity tolerance;Impaired balance (sitting and/or standing);Decreased coordination;Decreased safety awareness;Decreased cognition;Decreased knowledge of use of DME or AE;Cardiopulmonary status limiting activity;Obesity;Pain;Impaired UE functional use      OT Treatment/Interventions: Self-care/ADL training;Therapeutic exercise;Neuromuscular education;Energy conservation;DME and/or AE instruction;Therapeutic activities;Patient/family education;Cognitive remediation/compensation;Balance training    OT Goals(Current goals can be found in the care plan section) Acute Rehab OT Goals Patient Stated Goal: to get stronger OT Goal Formulation: With patient Time For Goal  Achievement: 05/19/19 Potential to Achieve Goals: Good  OT Frequency: Min 3X/week   Barriers to D/C:            Co-evaluation PT/OT/SLP Co-Evaluation/Treatment: Yes Reason for Co-Treatment: Complexity of the patient's impairments (multi-system involvement);For patient/therapist safety   OT goals addressed during session: ADL's and self-care;Strengthening/ROM      AM-PAC OT "6 Clicks" Daily Activity     Outcome Measure Help from another person eating meals?: A Little Help from another person taking care of personal grooming?: A Lot Help from another person toileting, which includes using toliet, bedpan, or urinal?: Total Help from another person bathing (including washing, rinsing, drying)?: A Lot Help from another person to put on and taking off regular upper body clothing?: A Lot Help from another person to put on and taking off regular lower body clothing?: Total 6 Click Score: 11   End of Session Equipment Utilized During Treatment: Oxygen(3L) Nurse Communication: Mobility status  Activity Tolerance: Patient tolerated treatment well Patient left: in bed;with call bell/phone within reach;with nursing/sitter in room  OT Visit Diagnosis: Other abnormalities of gait and mobility (R26.89);Muscle weakness (generalized) (M62.81);Other symptoms and signs involving cognitive function;Pain Pain - part of body: (hips; scrotum; penis)                Time: [id number] OT Time Calculation (min): 28 min Charges:  OT General Charges $OT Visit: 1 Visit OT Evaluation $OT Eval Moderate Complexity: [place], OT/L   Acute OT Clinical Specialist [place] Pager [phone] Office [phone]   [facility] 05/05/2019, 4:23 PM

## 2019-05-05 NOTE — Progress Notes (Signed)
Physical Therapy Treatment Patient Details Name: Robert [name] MRN: [id number] DOB: 1950/11/01 Today's Date: 05/05/2019    History of Present Illness 68 yo male [place] HTN,/CAD,GERD, DM,RA, T3 compression fx, wrist fx, COPD,ischemic myopathy presented to the hospital and was admitted on 04/11/2019 with shortness of breath, he was found to be significantly hypoxic, tested positive for COVID-19, was intubated and transferred to [facility] for further management    PT Comments    The patient was mobilized to sitting bed edge. At times able to self support. Sat at bed edge x 15 mins. Patient alert and communicates, answers questions. Patient does demonstrate slightly increased active movement of Legs but continues with profound weakness. Continue mobility and strengthening as tolerated. HR 86, SPO2 3 L 88%, 119/55 supine, sitting 146.68.   Follow Up Recommendations  SNF;CIR     Equipment Recommendations       Recommendations for Other Services       Precautions / Restrictions Precautions Precautions: Fall Precaution Comments:  significant  scrotal edema Required Braces or Orthoses: Other Brace Other Brace: B Prevalon boots Restrictions Weight Bearing Restrictions: (P) No    Mobility  Bed Mobility Overal bed mobility: Needs Assistance Bed Mobility: Rolling;Sidelying to Sit;Sit to Sidelying Rolling: +2 for physical assistance;Max assist Sidelying to sit: Max assist;+2 for physical assistance     Sit to sidelying: Total assist;+2 for physical assistance General bed mobility comments: attempting to help by using rail  Transfers                 General transfer comment: NA this session  Ambulation/Gait                 Stairs             Wheelchair Mobility    Modified Rankin (Stroke Patients Only)       Balance                                            Cognition Arousal/Alertness: Awake/alert Behavior During Therapy: WFL for  tasks assessed/performed Overall Cognitive Status: Impaired/Different from baseline Area of Impairment: Attention;Awareness;Problem solving                   Current Attention Level: Selective       Awareness: Emergent Problem Solving: Slow processing General Comments: will further assess; poor awareness of body in space; increased processing time, oriented to hospital, covid-19, month and year      Exercises General Exercises - Upper Extremity Shoulder Flexion: Both;10 reps;Supine;AAROM;AROM Shoulder ABduction: AAROM;AROM;10 reps;Supine Elbow Flexion: AROM;AAROM;Both;10 reps;Supine Elbow Extension: AROM;AAROM;Both;10 reps;Supine Digit Composite Flexion: AROM;Strengthening;Both;10 reps;Supine Composite Extension: AROM;Strengthening;Both;10 reps;Supine    General Comments        Pertinent Vitals/Pain Pain Assessment: Faces Faces Pain Scale: Hurts even more Pain Location: hips with mobility; penis and scrotal area Pain Descriptors / Indicators: Discomfort;Grimacing Pain Intervention(s): Monitored during session;Repositioned    Home Living Family/patient expects to be discharged to:: Private residence Living Arrangements: Spouse/significant other;Children Available Help at Discharge: Family Type of Home: House Home Access: Stairs to enter Entrance Stairs-Rails: Left Home Layout: Multi-level;Able to live on main level with bedroom/bathroom        Prior Function Level of Independence: Independent          PT Goals (current goals can now be found in the care plan section) Acute Rehab PT  Goals Patient Stated Goal: to get stronger Progress towards PT goals: Progressing toward goals    Frequency    Min 2X/week      PT Plan Current plan remains appropriate    Co-evaluation PT/OT/SLP Co-Evaluation/Treatment: Yes Reason for Co-Treatment: For patient/therapist safety PT goals addressed during session: Mobility/safety with mobility OT goals addressed  during session: ADL's and self-care      AM-PAC PT "6 Clicks" Mobility   Outcome Measure  Help needed turning from your back to your side while in a flat bed without using bedrails?: Total Help needed moving from lying on your back to sitting on the side of a flat bed without using bedrails?: Total Help needed moving to and from a bed to a chair (including a wheelchair)?: Total Help needed standing up from a chair using your arms (e.g., wheelchair or bedside chair)?: Total Help needed to walk in hospital room?: Total Help needed climbing 3-5 steps with a railing? : Total 6 Click Score: 6    End of Session   Activity Tolerance: Patient tolerated treatment well Patient left: in bed;with call bell/phone within reach;with nursing/sitter in room Nurse Communication: Mobility status;Need for lift equipment PT Visit Diagnosis: Muscle weakness (generalized) (M62.81);Difficulty in walking, not elsewhere classified (R26.2)     Time: [id number] PT Time Calculation (min) (ACUTE ONLY): 28 min  Charges:  $Therapeutic Activity: 8-22 mins                     Tresa Endo PT Acute Rehabilitation Services  Office [phone]    Claretha Cooper 05/05/2019, 5:24 PM

## 2019-05-06 LAB — GLUCOSE, CAPILLARY
Glucose-Capillary: 131 mg/dL — ABNORMAL HIGH (ref 70–99)
Glucose-Capillary: 142 mg/dL — ABNORMAL HIGH (ref 70–99)
Glucose-Capillary: 167 mg/dL — ABNORMAL HIGH (ref 70–99)
Glucose-Capillary: 217 mg/dL — ABNORMAL HIGH (ref 70–99)
Glucose-Capillary: 297 mg/dL — ABNORMAL HIGH (ref 70–99)
Glucose-Capillary: 373 mg/dL — ABNORMAL HIGH (ref 70–99)
Glucose-Capillary: 398 mg/dL — ABNORMAL HIGH (ref 70–99)

## 2019-05-06 LAB — CBC
HCT: 33 % — ABNORMAL LOW (ref 39.0–52.0)
Hemoglobin: 10.4 g/dL — ABNORMAL LOW (ref 13.0–17.0)
MCH: 29.8 pg (ref 26.0–34.0)
MCHC: 31.5 g/dL (ref 30.0–36.0)
MCV: 94.6 fL (ref 80.0–100.0)
Platelets: 90 10*3/uL — ABNORMAL LOW (ref 150–400)
RBC: 3.49 MIL/uL — ABNORMAL LOW (ref 4.22–5.81)
RDW: 18 % — ABNORMAL HIGH (ref 11.5–15.5)
WBC: 5.6 10*3/uL (ref 4.0–10.5)
nRBC: 0 % (ref 0.0–0.2)

## 2019-05-06 LAB — BASIC METABOLIC PANEL
Anion gap: 9 (ref 5–15)
BUN: 55 mg/dL — ABNORMAL HIGH (ref 8–23)
CO2: 26 mmol/L (ref 22–32)
Calcium: 8.6 mg/dL — ABNORMAL LOW (ref 8.9–10.3)
Chloride: 107 mmol/L (ref 98–111)
Creatinine, Ser: 1.16 mg/dL (ref 0.61–1.24)
GFR calc Af Amer: >60 mL/min (ref >60)
GFR calc non Af Amer: >60 mL/min (ref >60)
Glucose, Bld: 176 mg/dL — ABNORMAL HIGH (ref 70–99)
Potassium: 3.9 mmol/L (ref 3.5–5.1)
Sodium: 142 mmol/L (ref 135–145)

## 2019-05-06 MED ORDER — FUROSEMIDE 10 MG/ML IJ SOLN
40.0000 mg | Freq: Two times a day (BID) | INTRAMUSCULAR | Status: AC
  Administered 2019-05-06: 40 mg via INTRAVENOUS
  Filled 2019-05-06: qty 4

## 2019-05-06 MED ORDER — FUROSEMIDE 20 MG PO TABS
40.0000 mg | ORAL_TABLET | Freq: Every day | ORAL | Status: DC
  Administered 2019-05-07 – 2019-05-09 (×3): 40 mg via ORAL
  Filled 2019-05-06 (×3): qty 2

## 2019-05-06 MED ORDER — COLLAGENASE 250 UNIT/GM EX OINT
TOPICAL_OINTMENT | Freq: Every day | CUTANEOUS | Status: DC
  Administered 2019-05-06 – 2019-05-18 (×13): via TOPICAL
  Filled 2019-05-06 (×3): qty 30

## 2019-05-06 NOTE — Progress Notes (Signed)
Patient turned to show wounds to [place], rectal tube out with balloon inflated.  Discussed tube with wound nurse and she states that as long as patient not frequently incontinent would be better to leave out for patient rectal tone.  Dr. Renne Crigler notified rectal tube out. Patient verbalized understanding of need to call for assistance if having BM.

## 2019-05-06 NOTE — Progress Notes (Signed)
  Speech Language Pathology Treatment: Dysphagia  Patient Details Name: Robert [name] MRN: [id number] DOB: 12/13/1950 Today's Date: 05/06/2019 Time: 31-1410 SLP Time Calculation (min) (ACUTE ONLY): 27 min  Assessment / Plan / Recommendation Clinical Impression  Pt demonstrates stable appearing swallow function. He is moderately hoarse, RN reports intermittent coughing with nectar thick liquids. Pt observed to be very thirsty, starting to dislike nectar thick liquids, but aware of their benefit. Pt does like ice in his drinks and since he drinks them so quickly it is ok for him to have ice in thickened liquids. No immediate coughing over about 18 oz of intake with large consecutive straw sips. He did have 2 instances of delayed coughing and so increased cueing for a preventative throat clear given with success. Pt also now with dentures that he is wearing and reports satisfaction with meals. Will continue to monitor for tolerance and potential for upgrade.    HPI HPI: 68 year old male with history of COPD, CAD, diabetes mellitus, ischemic cardiomyopathy, RA, presented to the hospital and was admitted on 04/11/2019 with shortness of breath, he was found to be significantly hypoxic, tested positive for COVID-19, was intubated and transferred to [facility] for further management. Intubated 8/1-8/2, reintubated 8/3-8/22.       SLP Plan  Continue with current plan of care       Recommendations  Diet recommendations: Dysphagia 3 (mechanical soft);Nectar-thick liquid Liquids provided via: Cup;Straw Medication Administration: Whole meds with puree Supervision: Patient able to self feed Compensations: Multiple dry swallows after each bite/sip;Clear throat intermittently Postural Changes and/or Swallow Maneuvers: Seated upright 90 degrees                Oral Care Recommendations: Oral care BID Follow up Recommendations: Inpatient Rehab SLP Visit Diagnosis: Dysphagia, oropharyngeal phase  (R13.12) Plan: Continue with current plan of care       GO                Rylen Hou, Katherene Ponto 05/06/2019, 2:15 PM

## 2019-05-06 NOTE — Consult Note (Signed)
[place] Nurse wound consult note Consultation was completed by review of records, telehealth visit  and assistance from the bedside nurse/clinical staff.   Reason for Consult:sacral and buttock pressure injuries Wound type: 1. Right buttock; Unstageable Pressure Injury; 75% black;dark purple/ 25% pink 2. Left buttock; Stage 3 Pressure Injury; 100% pink 3. Gluteal cleft/sacrum; Stage 3 Pressure Injury;  pink/some yellow but unable to visualize, reported by the nurse at the bedside  Pressure Injury POA: No Measurement: see nursing flow sheets left buttock and sacrum appear accurate. I have asked the bedside nurse to remeasure the right buttock. Last measurements are in from 04/17/19 Wound bed: see above  Drainage (amount, consistency, odor) no able to assess no dressings in place and leaking stool from [place] procedure/placement/frequency: 1. Add low air loss mattress for pressure redistribution and moisture management 2. Add enzymatic debridement ointment to the right buttock wound daily, top with saline moist gauze and dry dressing 3. Silicone foam into the gluteal cleft, making sure that the foam touches the base of the gluteal crease and foam to the left buttock. Change every 3 days, lift q shift for assessment for changes in the wound  4. Report any acute changes in the left buttock and the gluteal cleft to the [facility] nurse 5. Add chair pressure redistribution pad for chair now that patient is beginning to mobilize 6. Discussed with bedside nurse to leave FMS out, this had fallen out just prior to my assessment. Now that the patient is awake and able to communicate needs will be able to use bedpan or BSC once more mobile and lessen burden to the anal sphincter.  [place] Nurse team will follow with you and see patient within 10 days for wound assessments.  Please notify [place] nurses of any acute changes in the wounds or any new areas of concern [place] MSN, [place], [place],  [place]

## 2019-05-06 NOTE — Progress Notes (Signed)
PROGRESS NOTE  Robert [name] [id number] DOB: 1951-01-15 DOA: 04/11/2019 PCP: Redmond School, MD   LOS: 24 days   Brief Narrative / Interim history: 68 year old male with history of COPD, CAD, diabetes mellitus, ischemic cardiomyopathy, RA, presented to the hospital and was admitted on 04/11/2019 with shortness of breath, he was found to be significantly hypoxic, tested positive for COVID-19, was intubated and transferred to [facility] for further management.  Hospital course complicated by persistent encephalopathy and difficulty weaning due to tachypnea and agitation, he failed extubation once on 8/2.  Hospital course was also complicated by MRSA pneumonia with septic shock at the beginning of August status post completed treatment. Patient eventually was eventually successfully extubated on 8/22 and transferred to the floor on 8/24  Significant events: 8/1 admission to [facility] ICU 8/2 extubated 8/3 early AM agitated, placed on BIPAP, required re-intubation 8/4 started on steroids for COPD exacerbation 8/8 septic shock requiring pressor support 8/9 off pressors, respiratory cultures +s.aureus 8/10 persistent encephalopathy off sedation, CT head ordered 8/11 encephalopathy improved 8/12 difficulty weaning due to tachypnea, ETT exchanged due to clot burden 8/13 PICC line inserted and subclavian central line removed 8/14 Antibiotics coverage broaden due to persistent fever and resp distress, pan cultured  8/19 more alert, tolerating pressure support 8/20 failed voiding trial, Foley needed to be reinserted 8/22 successfully extubated  8/24 transfer to the floor  Subjective: -Eating breakfast, this morning denies any shortness of breath, denies any chest pain.  He feels well but does complain of ongoing weakness  Assessment & Plan: Principal Problem:   Pneumonia due to COVID-19 virus Active Problems:   Rheumatoid arthritis ([place])   Hyperlipidemia   COPD (chronic obstructive pulmonary disease)  (HCC)   Controlled type 2 diabetes mellitus with diabetic peripheral angiopathy without gangrene, with long-term current use of insulin (HCC)   Essential hypertension   Ischemic cardiomyopathy   Coronary artery disease   Dyslipidemia (high LDL; low HDL)   Acute respiratory failure with hypoxemia (HCC)   COVID-19 virus detected   Acute respiratory distress syndrome (ARDS) due to COVID-19 virus   Atrial fibrillation with RVR (HCC)   COPD (chronic obstructive pulmonary disease) with emphysema (HCC)   AKI (acute kidney injury) ([place])   Diabetes mellitus type 2, uncontrolled, with complications (HCC)   Pressure injury of skin   Staphylococcal pneumonia ([place])   HCAP (healthcare-associated pneumonia)   Principal Problem Acute Hypoxic Respiratory Failure due to Covid-19 Viral Illness, COPD exacerbation -Extubated on 8/22, remains stable from respiratory standpoint, 2 L nasal cannula this morning -Continue his inhalers for COPD, no longer wheezing -Repeat COVID-19 testing today as he may need SNF/CIR  Fever: Temp (24hrs), Avg:97.9 F (36.6 C), Min:97.5 F (36.4 C), Max:98.5 F (36.9 C)  Antibiotics:  Cefepime 7/31 >8/4  Flagyl 7/31  Remdesivir 8/1 >8/5  Actemra 8/1  Decadron/solumedrol 8/1 >8/8  Zosyn 8/8>8/9 Azithro 8/7>8/9 Cefepime 8/14 >8/19 Vanc 7/31, 8/8>8/22  COVID-19 interventions: Remdesivir: 8/1 >> 8/5 Steroids: decadron / solumedrol 8/1 >> 8/5 Actemra: 8/1 Convalescent Plasma: none  Vitamin C and Zinc: yes   COVID-19 Labs  Recent Labs    05/04/19 0515 05/05/19 0455  DDIMER 3.03* 2.51*  CRP 2.1* 3.8*    Lab Results  Component Value Date   SARSCOV2NAA POSITIVE (A) 04/11/2019   SARSCOV2NAA Detected (A) 04/11/2019    Active Problems MRSA HCAP /septic shock -Off pressors now -Completed treatment with vancomycin for 3 weeks, last day was 8/22 -Respiratory status improving, extubated on 8/22 AM, currently  stable on the floor  Paroxysmal A. fib with  RVR -Currently in sinus, on metoprolol 25mg  3 times daily -He was on IV heparin but no anticoagulation is on hold due to bleeding with ETT clotting as well as bleeding from the subclavian line -Personally reviewed the saved telemetry strips in the computer and EKGs, I am not seeing any runs of A. fib, suspect may have been very short in the setting of acute illness and has not recurred.  Will need anticoagulation if A. fib recurs.  Acute on chronic diastolic CHF, essential hypertension, history of CAD -History of PCI in 2018 with in-stent restenosis, currently on aspirin and DVT prophylaxis -Resume Plavix -Continue Lasix, closely monitor renal function, change to oral Lasix starting tomorrow  Deconditioning/ICU induced muscle weakness -Aggressive PT/OT -On dysphagia 3 with thick liquids per speech  Diet Orders (From admission, onward)    Start     Ordered   05/05/19 1123  DIET DYS 3 Room service appropriate? Yes; Fluid consistency: Nectar Thick  Diet effective now    Question Answer Comment  Room service appropriate? Yes   Fluid consistency: Nectar Thick      05/05/19 1122         Hypernatremia -Resolved, sodium now normal  Acute kidney injury -Creatinine peaked as high as 2.39 on 04/24/2019, possibly multifactorial in the setting of diuresis and use of vancomycin -He was also found to have retention requiring Foley catheter placement. -Creatinine 1.16 this morning, monitor  Acute urinary obstruction -In the setting of ICU stay. Failed voiding trial on 8/20 and Foley had to be placed back.  Continue Flomax -Once he is more mobile and working with physical therapy will have to have a repeat voiding trial  ETT clotting/bleeding from the subclavian line -Patient was on therapeutic heparin, ET tube was clotted and exchange on 04/23/2019.  Currently on heparin prophylaxis -Hemoglobin stable  Thrombocytopenia -Chronic, stable  Type 2 diabetes mellitus, uncontrolled with  hyperglycemia with vascular disease and neuropathy at baseline -CBGs fair, continue to monitor  CBG (last 3)  Recent Labs    05/05/19 2027 05/06/19 0419 05/06/19 0443  GLUCAP 305* 142* 131*   RA -He is on Actemra and methotrexate at home, currently on hold  Pressure ulcer of medial sacrum bilateral buttocks Pressure Injury 04/17/19 Sacrum Medial Deep Tissue Injury - Purple or maroon localized area of discolored intact skin or blood-filled blister due to damage of underlying soft tissue from pressure and/or shear. dark purple with some skin sloughing on lowe (Active)  04/17/19 0730  Location: Sacrum  Location Orientation: Medial  Staging: Deep Tissue Injury - Purple or maroon localized area of discolored intact skin or blood-filled blister due to damage of underlying soft tissue from pressure and/or shear.  Wound Description (Comments): dark purple with some skin sloughing on lower sacral area  Present on Admission: No     Pressure Injury 04/17/19 Buttocks Right;Lower Unstageable - Full thickness tissue loss in which the base of the ulcer is covered by slough (yellow, tan, gray, green or brown) and/or eschar (tan, brown or black) in the wound bed. (Active)  04/17/19 0730  Location: Buttocks  Location Orientation: Right;Lower  Staging: Unstageable - Full thickness tissue loss in which the base of the ulcer is covered by slough (yellow, tan, gray, green or brown) and/or eschar (tan, brown or black) in the wound bed.  Wound Description (Comments):   Present on Admission: No     Pressure Injury 04/17/19 Buttocks Left;Lower Deep Tissue  Injury - Purple or maroon localized area of discolored intact skin or blood-filled blister due to damage of underlying soft tissue from pressure and/or shear. (Active)  04/17/19 0730  Location: Buttocks  Location Orientation: Left;Lower  Staging: Deep Tissue Injury - Purple or maroon localized area of discolored intact skin or blood-filled blister due to  damage of underlying soft tissue from pressure and/or shear.  Wound Description (Comments):   Present on Admission: No     Scheduled Meds: . albuterol  2 puff Inhalation Q6H  . aspirin  81 mg Oral Daily  . bethanechol  10 mg Oral BID  . chlorhexidine  15 mL Mouth Rinse BID  . Chlorhexidine Gluconate Cloth  6 each Topical Daily  . clopidogrel  75 mg Oral Daily  . enoxaparin (LOVENOX) injection  50 mg Subcutaneous Q24H  . feeding supplement (ENSURE ENLIVE)  237 mL Oral TID BM  . folic acid  1 mg Oral Daily  . furosemide  40 mg Intravenous BID  . gabapentin  100 mg Oral Q8H  . insulin aspart  0-15 Units Subcutaneous Q4H  . insulin glargine  15 Units Subcutaneous Daily  . mouth rinse  15 mL Mouth Rinse q12n4p  . metoprolol tartrate  25 mg Oral TID  . mometasone-formoterol  2 puff Inhalation BID  . multivitamin with minerals  1 tablet Oral Daily  . sodium chloride flush  10-40 mL Intracatheter Q12H  . tamsulosin  0.8 mg Oral QPC supper   Continuous Infusions: . sodium chloride Stopped (05/05/19 1136)   PRN Meds:.sodium chloride, acetaminophen, dextrose, hydrALAZINE, Resource ThickenUp Clear, sodium chloride flush, Thrombi-Pad   DVT prophylaxis: Lovenox Code Status: Full code Family Communication: Discussed with daughter over the phone 8/24 Disposition Plan: Transfer to progressive  Consultants:   PCCM  Procedures:   None   Micro data: 7/31 blood >NGTD 7/31 sars-cov-2 >positive 8/1 Resp CX > Normal flora 8/7 Resp CX >MRSA 8/7 BCX > NGTD 8/8 UCX > NGTD 8/15 Resp culture>  MRSA    Objective: Vitals:   05/06/19 0000 05/06/19 0400 05/06/19 0500 05/06/19 0800  BP: 127/71 126/67  140/72  Pulse: 78 78  77  Resp: (!) 22 20  (!) 24  Temp:    (!) 97.5 F (36.4 C)  TempSrc:    Oral  SpO2: 97% 92%  93%  Weight:   111 kg   Height:        Intake/Output Summary (Last 24 hours) at 05/06/2019 1111 Last data filed at 05/06/2019 0600 Gross per 24 hour  Intake 1360 ml   Output 3000 ml  Net -1640 ml   Filed Weights   05/04/19 0400 05/05/19 0300 05/06/19 0500  Weight: 104.5 kg 104.2 kg 111 kg    Examination:  Constitutional: No distress, eating breakfast, watching TV Eyes: No scleral icterus ENMT: mmm Neck: Normal, supple Respiratory: Clear bilaterally, no wheezing, no crackles, good air movement Cardiovascular: Regular rate and rhythm, no murmurs.  No peripheral edema Abdomen: Soft, NT, ND, positive bowel sounds Musculoskeletal: no clubbing / cyanosis. Skin: No rashes Neurologic: Nonfocal, equal strength but generalized weakness present  Data Reviewed: I have independently reviewed following labs and imaging studies    CBC: Recent Labs  Lab May 27, 2019 0445 05/02/19 0510 05/03/19 0435 05/04/19 0515 05/04/19 0941 05/05/19 0455 05/06/19 0640  WBC 7.5 6.5 7.1 7.1  --  6.6 5.6  NEUTROABS 6.0 5.0 5.7 5.7  --  5.1  --   HGB 8.8* 8.9* 8.8* 9.0* 8.2* 8.8* 10.4*  HCT 28.8* 28.7* 28.7* 29.1* 24.0* 28.0* 33.0*  MCV 99.0 98.0 97.3 97.0  --  96.2 94.6  PLT 106* 98* 102* 99*  --  92* 90*   Basic Metabolic Panel: Recent Labs  Lab 05/01/19 0445 05/02/19 0510 05/03/19 0435 05/04/19 0515 05/04/19 0941 05/05/19 0455 05/06/19 0640  NA 148* 150* 150* 151* 151* 149* 142  K 4.3 4.5 4.4 4.1 3.9 4.1 3.9  CL 118* 121* 118* 118*  --  115* 107  CO2 26 25 25 28   --  27 26  GLUCOSE 162* 113* 108* 148*  --  222* 176*  BUN 60* 62* 65* 66*  --  68* 55*  CREATININE 1.18 1.27* 1.28* 1.26*  --  1.22 1.16  CALCIUM 8.2* 8.5* 8.7* 8.7*  --  8.7* 8.6*  MG 2.3 2.2 2.2 2.1  --  2.1  --    GFR: Estimated Creatinine Clearance: 80.6 mL/min (by C-G formula based on SCr of 1.16 mg/dL). Liver Function Tests: Recent Labs  Lab 05/01/19 0445 05/02/19 0510 05/03/19 0435 05/04/19 0515 05/05/19 0455  AST 47* 47* 47* 41 29  ALT 90* 80* 79* 72* 62*  ALKPHOS 84 83 92 90 94  BILITOT 0.3 0.2* 0.4 0.4 0.3  PROT 5.3* 5.5* 5.7* 5.8* 5.7*  ALBUMIN 2.1* 2.0* 2.1* 2.2* 2.2*    No results for input(s): LIPASE, AMYLASE in the last 168 hours. Recent Labs  Lab 05/04/19 1043  AMMONIA 22   Coagulation Profile: No results for input(s): INR, PROTIME in the last 168 hours. Cardiac Enzymes: No results for input(s): CKTOTAL, CKMB, CKMBINDEX, TROPONINI in the last 168 hours. BNP (last 3 results) No results for input(s): PROBNP in the last 8760 hours. HbA1C: No results for input(s): HGBA1C in the last 72 hours. CBG: Recent Labs  Lab 05/05/19 1210 05/05/19 1557 05/05/19 2027 05/06/19 0419 05/06/19 0443  GLUCAP 224* 265* 305* 142* 131*   Lipid Profile: No results for input(s): CHOL, HDL, LDLCALC, TRIG, CHOLHDL, LDLDIRECT in the last 72 hours. Thyroid Function Tests: No results for input(s): TSH, T4TOTAL, FREET4, T3FREE, THYROIDAB in the last 72 hours. Anemia Panel: No results for input(s): VITAMINB12, FOLATE, FERRITIN, TIBC, IRON, RETICCTPCT in the last 72 hours. Urine analysis:    Component Value Date/Time   COLORURINE YELLOW 04/25/2019 0826   APPEARANCEUR HAZY (A) 04/25/2019 0826   LABSPEC 1.015 04/25/2019 0826   PHURINE 5.0 04/25/2019 0826   GLUCOSEU 150 (A) 04/25/2019 0826   HGBUR LARGE (A) 04/25/2019 0826   BILIRUBINUR NEGATIVE 04/25/2019 0826   KETONESUR NEGATIVE 04/25/2019 0826   PROTEINUR 30 (A) 04/25/2019 0826   UROBILINOGEN 0.2 12/22/2011 2130   NITRITE NEGATIVE 04/25/2019 0826   LEUKOCYTESUR NEGATIVE 04/25/2019 0826   Sepsis Labs: Invalid input(s): PROCALCITONIN, LACTICIDVEN  No results found for this or any previous visit (from the past 240 hour(s)).   Radiology Studies: Dg Swallowing Func-speech Pathology  Result Date: 05/05/2019 Objective Swallowing Evaluation: Type of Study: MBS-Modified Barium Swallow Study  Patient Details Name: Robert [name] MRN: [id number] Date of Birth: Nov 15, 1950 Today's Date: 05/05/2019 Time: SLP Start Time (ACUTE ONLY): 1030 -SLP Stop Time (ACUTE ONLY): 1100 SLP Time Calculation (min) (ACUTE ONLY): 30 min  Past Medical History: Past Medical History: Diagnosis Date . Anxiety  . COPD (chronic obstructive pulmonary disease) ([place])  . Coronary artery disease  . Full dentures  . GERD (gastroesophageal reflux disease)   Rolaids as needed . High cholesterol  . History of MI (myocardial infarction)  . Hypertension   med.  dosage increased 04/2016; has been on BP med. x 10 yrs. . Incarcerated umbilical hernia 0000000 . Inguinal hernia 05/2016  bilateral  . Insulin dependent diabetes mellitus ([place])  . Ischemic cardiomyopathy   a. 03/2015 EF 40-45% by LV gram. . Left leg cellulitis 10/08/2016 . Rheumatoid arthritis(714.0)  Past Surgical History: Past Surgical History: Procedure Laterality Date . CARDIAC CATHETERIZATION N/A 02/01/2015  Procedure: Left Heart Cath and Coronary Angiography;  Surgeon: Belva Crome, MD;  Location: [place] [facility];  Service: Cardiovascular;  Laterality: N/A; . CARDIAC CATHETERIZATION N/A 03/22/2015  Procedure: Left Heart Cath and Coronary Angiography;  Surgeon: Leonie Man, MD;  Location: [place] [facility];  Service: Cardiovascular;  Laterality: N/A; . CARDIAC CATHETERIZATION  09/05/2002; 03/09/2003; 04/10/2005;06/02/2005; 05/09/2006; 02/09/2007; 03/01/2007 . COLONOSCOPY  2008  Dr. Gala Romney: diverticulosis, hyperplastic polyp. . CORONARY ANGIOPLASTY  11/14/2012; 02/01/2015 . CORONARY ANGIOPLASTY WITH STENT PLACEMENT  05/16/2012  "1; makes total ~ 4" . CORONARY STENT INTERVENTION N/A 06/06/2017  Procedure: CORONARY STENT INTERVENTION;  Surgeon: Belva Crome, MD;  Location: [place] [facility];  Service: Cardiovascular;  Laterality: N/A; . INSERTION OF MESH Bilateral 05/24/2016  Procedure: INSERTION OF MESH;  Surgeon: Coralie Keens, MD;  Location: [place];  Service: General;  Laterality: Bilateral; . LAPAROSCOPIC CHOLECYSTECTOMY   . LAPAROSCOPIC INGUINAL HERNIA WITH UMBILICAL HERNIA Bilateral 05/24/2016  Procedure: BILATERAL LAPAROSCOPIC INGUINAL HERNIA REPAIR WITH MESH AND UMBILICAL HERNIA  REPAIR WITH MESH;  Surgeon: Coralie Keens, MD;  Location: [place];  Service: General;  Laterality: Bilateral; . LEFT HEART CATH AND CORONARY ANGIOGRAPHY N/A 06/06/2017  Procedure: LEFT HEART CATH AND CORONARY ANGIOGRAPHY;  Surgeon: Belva Crome, MD;  Location: [place] [facility];  Service: Cardiovascular;  Laterality: N/A; . LEFT HEART CATHETERIZATION WITH CORONARY ANGIOGRAM N/A 12/22/2011  Procedure: LEFT HEART CATHETERIZATION WITH CORONARY ANGIOGRAM;  Surgeon: Troy Sine, MD;  Location: [facility] CATH LAB;  Service: Cardiovascular;  Laterality: N/A; . LEFT HEART CATHETERIZATION WITH CORONARY ANGIOGRAM Bilateral 05/16/2012  Procedure: LEFT HEART CATHETERIZATION WITH CORONARY ANGIOGRAM;  Surgeon: Lorretta Harp, MD;  Location: [facility] CATH LAB;  Service: Cardiovascular;  Laterality: Bilateral; . LEFT HEART CATHETERIZATION WITH CORONARY ANGIOGRAM N/A 11/14/2012  Procedure: LEFT HEART CATHETERIZATION WITH CORONARY ANGIOGRAM;  Surgeon: Troy Sine, MD;  Location: [facility] CATH LAB;  Service: Cardiovascular;  Laterality: N/A; . LEFT HEART CATHETERIZATION WITH CORONARY ANGIOGRAM N/A 09/01/2013  Procedure: LEFT HEART CATHETERIZATION WITH CORONARY ANGIOGRAM;  Surgeon: Leonie Man, MD;  Location: [facility] CATH LAB;  Service: Cardiovascular;  Laterality: N/A; . [place]; 1973; 1985  x 3 . NM MYOCAR PERF WALL MOTION  08/30/2009  No significant ischemia . OPEN REDUCTION INTERNAL FIXATION (ORIF) DISTAL RADIAL FRACTURE Right 12/10/2016  Procedure: OPEN REDUCTION INTERNAL FIXATION (ORIF) DISTAL RADIAL FRACTURE;  Surgeon: Iran Planas, MD;  Location: [place];  Service: Orthopedics;  Laterality: Right; . PERCUTANEOUS CORONARY INTERVENTION-BALLOON ONLY  12/22/2011  Procedure: PERCUTANEOUS CORONARY INTERVENTION-BALLOON ONLY;  Surgeon: Troy Sine, MD;  Location: [facility] CATH LAB;  Service: Cardiovascular;; . PERCUTANEOUS CORONARY INTERVENTION-BALLOON ONLY  11/14/2012  Procedure: PERCUTANEOUS CORONARY INTERVENTION-BALLOON  ONLY;  Surgeon: Troy Sine, MD;  Location: [facility] CATH LAB;  Service: Cardiovascular;; . PERCUTANEOUS CORONARY STENT INTERVENTION (PCI-S)  05/16/2012  Procedure: PERCUTANEOUS CORONARY STENT INTERVENTION (PCI-S);  Surgeon: Lorretta Harp, MD;  Location: [facility] CATH LAB;  Service: Cardiovascular;; . PERCUTANEOUS CORONARY STENT INTERVENTION (PCI-S)  09/01/2013  Procedure: PERCUTANEOUS CORONARY STENT INTERVENTION (PCI-S);  Surgeon: Leonie Man, MD;  Location: [place] CATH LAB;  Service: Cardiovascular;; HPI: 68 year old male with history of COPD, CAD, diabetes mellitus, ischemic cardiomyopathy, RA, presented to the hospital and was admitted on 04/11/2019 with shortness of breath, he was found to be significantly hypoxic, tested positive for COVID-19, was intubated and transferred to [facility] for further management. Intubated 8/1-8/2, reintubated 8/3-8/22.  No data recorded Assessment / Plan / Recommendation CHL IP CLINICAL IMPRESSIONS 05/05/2019 Clinical Impression Pt demonstrates a moderate pharyngeal dysphagia with primary deficits associated with laryngeal closure and pharyngeal strength. Pt initially tested with Cortrak in place, and then removed mid test. Pt demonstrates decreased vestibular closure resulting in consistent penetration across consistencies. With thin liquid, there was also mild sensed aspiration (without ejection) as glottic closure released while significant penetrates were still present. Performance was similar between honey thick liquids and nectar, though nectar residue transited a bit easier. In these instances, even with consecutive swallows with large boluses pt penetrated to the cords and had mild to moderate vallecular residue post swallow. Subsequent swallows and throat clears (sensed but cueing needed for appropriate effort) ejected penetrate and cleared residue. Again, this also improved somewhat after Cortrak removal. A chin tuck was attempted but provided no significant benefit. Pt is recommended  to initaite a dys 3 (mech soft) diet with nectar thick liquids with reminders for intermittent throat clearing and second swallows. Expect function to improve as vocal quality also improves but RMT may be beneficial for pharyngeal and respiratory strength.  SLP Visit Diagnosis Dysphagia, oropharyngeal phase (R13.12) Attention and concentration deficit following -- Frontal lobe and executive function deficit following -- Impact on safety and function Moderate aspiration risk   CHL IP TREATMENT RECOMMENDATION 05/05/2019 Treatment Recommendations Therapy as outlined in treatment plan below   Prognosis 05/05/2019 Prognosis for Safe Diet Advancement Good Barriers to Reach Goals -- Barriers/Prognosis Comment -- CHL IP DIET RECOMMENDATION 05/05/2019 SLP Diet Recommendations Dysphagia 3 (Mech soft) solids;Nectar thick liquid Liquid Administration via Cup;Straw Medication Administration Whole meds with liquid Compensations Multiple dry swallows after each bite/sip;Clear throat intermittently Postural Changes Seated upright at 90 degrees;Remain semi-upright after after feeds/meals (Comment)   CHL IP OTHER RECOMMENDATIONS 05/03/2019 Recommended Consults -- Oral Care Recommendations -- Other Recommendations Order thickener from pharmacy   CHL IP FOLLOW UP RECOMMENDATIONS 05/05/2019 Follow up Recommendations Inpatient Rehab   CHL IP FREQUENCY AND DURATION 05/05/2019 Speech Therapy Frequency (ACUTE ONLY) min 2x/week Treatment Duration 2 weeks      CHL IP ORAL PHASE 05/05/2019 Oral Phase WFL Oral - Pudding Teaspoon -- Oral - Pudding Cup -- Oral - Honey Teaspoon -- Oral - Honey Cup -- Oral - Nectar Teaspoon -- Oral - Nectar Cup -- Oral - Nectar Straw -- Oral - Thin Teaspoon -- Oral - Thin Cup -- Oral - Thin Straw -- Oral - Puree -- Oral - Mech Soft -- Oral - Regular -- Oral - Multi-Consistency -- Oral - Pill -- Oral Phase - Comment --  CHL IP PHARYNGEAL PHASE 05/05/2019 Pharyngeal Phase Impaired Pharyngeal- Pudding Teaspoon -- Pharyngeal  -- Pharyngeal- Pudding Cup -- Pharyngeal -- Pharyngeal- Honey Teaspoon -- Pharyngeal -- Pharyngeal- Honey Cup Penetration/Aspiration during swallow;Reduced epiglottic inversion;Reduced airway/laryngeal closure;Pharyngeal residue - valleculae Pharyngeal Material enters airway, remains ABOVE vocal cords and not ejected out Pharyngeal- Nectar Teaspoon -- Pharyngeal -- Pharyngeal- Nectar Cup Penetration/Aspiration during swallow;Reduced epiglottic inversion;Reduced airway/laryngeal closure;Pharyngeal residue - valleculae Pharyngeal Material enters airway, remains ABOVE vocal cords and not ejected out;Material does not enter airway;Material enters airway, remains ABOVE vocal cords then ejected out;Material  enters airway, CONTACTS cords and then ejected out Pharyngeal- Nectar Straw Penetration/Aspiration during swallow;Reduced epiglottic inversion;Reduced airway/laryngeal closure;Pharyngeal residue - valleculae Pharyngeal Material enters airway, remains ABOVE vocal cords and not ejected out;Material does not enter airway;Material enters airway, remains ABOVE vocal cords then ejected out;Material enters airway, CONTACTS cords and then ejected out Pharyngeal- Thin Teaspoon -- Pharyngeal -- Pharyngeal- Thin Cup -- Pharyngeal -- Pharyngeal- Thin Straw Penetration/Aspiration during swallow;Penetration/Apiration after swallow;Trace aspiration;Pharyngeal residue - valleculae;Reduced airway/laryngeal closure;Reduced epiglottic inversion Pharyngeal Material enters airway, passes BELOW cords and not ejected out despite cough attempt by patient Pharyngeal- Puree Reduced epiglottic inversion;Pharyngeal residue - valleculae Pharyngeal -- Pharyngeal- Mechanical Soft -- Pharyngeal -- Pharyngeal- Regular Pharyngeal residue - valleculae;Reduced epiglottic inversion Pharyngeal -- Pharyngeal- Multi-consistency -- Pharyngeal -- Pharyngeal- Pill -- Pharyngeal -- Pharyngeal Comment --  No flowsheet data found. [name], [place] [facility] Pager [phone] Office [phone] [name] 05/05/2019, 11:52 AM                 Marzetta Board, MD, PhD [facility]  Contact via  [email or web address]  [place] P: [phone] F: [phone]

## 2019-05-06 NOTE — Progress Notes (Signed)
   D/w [facility] MD Dr Marzetta Board - patient on 2 LNC and in floor and stable  Ccm can sign off    SIGNATURE    Dr. Brand Males, M.D., F.C.C.P,  Pulmonary and Critical Care Medicine Staff Physician, [place] Director - Interstitial Lung Disease  Program  [facility] [place] [facility] [place], [place], [address]  Pager: [phone], If no answer or between  15:00h - 7:00h: call [phone] Telephone: [phone]  10:17 AM 05/06/2019

## 2019-05-06 NOTE — Progress Notes (Signed)
Telephone call to patient's daughter, Rupinder Therrell, updated on plan of care for shift and answered all questions.

## 2019-05-06 NOTE — Consult Note (Signed)
I have placed a request via Secure Chat to Dr. Cruzita Lederer  requesting photos of the wound areas of concern to be placed in the EMR.    [place], [place], [place]

## 2019-05-06 NOTE — Care Management (Signed)
Case manager contacted patient's daughter [name] as requested. Answered her questions concerning next step for her dad, assured her that [facility] is following his progress and we will wait for repeat COVID test results. She understands that he will need negative tests to be able to go to CIR. [name] voiced appreciation for call. Case manager will continue to follow for disposition.       Ricki Miller, RN BSN Case Manager [phone]

## 2019-05-07 LAB — SARS CORONAVIRUS 2 BY RT PCR (HOSPITAL ORDER, PERFORMED IN ~~LOC~~ HOSPITAL LAB): SARS Coronavirus 2: POSITIVE — AB

## 2019-05-07 LAB — COMPREHENSIVE METABOLIC PANEL
ALT: 57 U/L — ABNORMAL HIGH (ref 0–44)
AST: 31 U/L (ref 15–41)
Albumin: 2.3 g/dL — ABNORMAL LOW (ref 3.5–5.0)
Alkaline Phosphatase: 98 U/L (ref 38–126)
Anion gap: 8 (ref 5–15)
BUN: 51 mg/dL — ABNORMAL HIGH (ref 8–23)
CO2: 27 mmol/L (ref 22–32)
Calcium: 8.7 mg/dL — ABNORMAL LOW (ref 8.9–10.3)
Chloride: 106 mmol/L (ref 98–111)
Creatinine, Ser: 1.25 mg/dL — ABNORMAL HIGH (ref 0.61–1.24)
GFR calc Af Amer: >60 mL/min (ref >60)
GFR calc non Af Amer: 60 mL/min — ABNORMAL LOW (ref >60)
Glucose, Bld: 175 mg/dL — ABNORMAL HIGH (ref 70–99)
Potassium: 4 mmol/L (ref 3.5–5.1)
Sodium: 141 mmol/L (ref 135–145)
Total Bilirubin: 0.6 mg/dL (ref 0.3–1.2)
Total Protein: 5.9 g/dL — ABNORMAL LOW (ref 6.5–8.1)

## 2019-05-07 LAB — CBC
HCT: 27.2 % — ABNORMAL LOW (ref 39.0–52.0)
Hemoglobin: 8.7 g/dL — ABNORMAL LOW (ref 13.0–17.0)
MCH: 30.3 pg (ref 26.0–34.0)
MCHC: 32 g/dL (ref 30.0–36.0)
MCV: 94.8 fL (ref 80.0–100.0)
Platelets: 93 10*3/uL — ABNORMAL LOW (ref 150–400)
RBC: 2.87 MIL/uL — ABNORMAL LOW (ref 4.22–5.81)
RDW: 17.5 % — ABNORMAL HIGH (ref 11.5–15.5)
WBC: 5.6 10*3/uL (ref 4.0–10.5)
nRBC: 0 % (ref 0.0–0.2)

## 2019-05-07 LAB — GLUCOSE, CAPILLARY
Glucose-Capillary: 132 mg/dL — ABNORMAL HIGH (ref 70–99)
Glucose-Capillary: 149 mg/dL — ABNORMAL HIGH (ref 70–99)
Glucose-Capillary: 164 mg/dL — ABNORMAL HIGH (ref 70–99)
Glucose-Capillary: 190 mg/dL — ABNORMAL HIGH (ref 70–99)
Glucose-Capillary: 196 mg/dL — ABNORMAL HIGH (ref 70–99)
Glucose-Capillary: 205 mg/dL — ABNORMAL HIGH (ref 70–99)

## 2019-05-07 LAB — NOVEL CORONAVIRUS, NAA (HOSP ORDER, SEND-OUT TO REF LAB; TAT 18-24 HRS): SARS-CoV-2, NAA: NOT DETECTED

## 2019-05-07 NOTE — Progress Notes (Signed)
Physical Therapy Treatment Patient Details Name: Robert [name] MRN: [id number] DOB: [birth year] Today's Date: 05/07/2019    History of Present Illness 68 yo male [place] HTN,/CAD,GERD, DM,RA, T3 compression fx, wrist fx, COPD,ischemic myopathy presented to the hospital and was admitted on 04/11/2019 with shortness of breath, he was found to be significantly hypoxic, tested positive for COVID-19, was intubated and transferred to [facility] for further management    PT Comments    The patient is making  Gains in strength and  Ability to assist in mobility. Stood in [place] with max assist of 2. Continue mobility and strengthening  .   Follow Up Recommendations  CIR     Equipment Recommendations  (tba)    Recommendations for Other Services Rehab consult     Precautions / Restrictions Precautions Precautions: Fall Precaution Comments: scrotal edema; foley Required Braces or Orthoses: Other Brace Other Brace: B Prevalon boots    Mobility  Bed Mobility Overal bed mobility: Needs Assistance Bed Mobility: Supine to Sit Rolling: +2 for physical assistance;Mod assist Sidelying to sit: Max assist       General bed mobility comments: increased ability to move legs and to use RUE to push up into sitting  Transfers Overall transfer level: Needs assistance   Transfers: Sit to/from Stand Sit to Stand: Max assist;+2 physical assistance         General transfer comment: Staff will need to use Maximove, use of bed pad  to assist standing x 2 for flaps to be lowered and raised., transfer to recliner  Ambulation/Gait                 Stairs             Wheelchair Mobility    Modified Rankin (Stroke Patients Only)       Balance Overall balance assessment: Needs assistance   Sitting balance-Leahy Scale: Poor Sitting balance - Comments: required to maint support     Standing balance-Leahy Scale: Poor Standing balance comment: while in stedy, leaning forward over bar,  assist to sit back up into upright. while in chair, grips armrests and leans forward.                            Cognition Arousal/Alertness: Awake/alert Behavior During Therapy: WFL for tasks assessed/performed Overall Cognitive Status: Impaired/Different from baseline Area of Impairment: Attention;Awareness;Problem solving                   Current Attention Level: Selective       Awareness: Emergent Problem Solving: Slow processing General Comments: improving, spraking with family on [place], stated; we're having a party in here."      Exercises General Exercises - Upper Extremity Shoulder Flexion: AAROM;AROM;Strengthening;Both;10 reps;Supine Shoulder Extension: AROM;AAROM;Both;10 reps;Supine Shoulder ABduction: AROM;AAROM;Both;10 reps;Supine Elbow Flexion: AROM;AAROM;Both;10 reps;Seated Elbow Extension: AROM;AAROM;Both;10 reps;Seated Digit Composite Flexion: AROM;Both;10 reps    General Comments General comments (skin integrity, edema, etc.): Pt more interactive; joking appropriately with therapists; appropirate conversation with his son on the phone at end of sesison      Pertinent Vitals/Pain Pain Assessment: Faces Faces Pain Scale: Hurts little more Pain Location: general discomfort with mobility, scrotal pain sitting Pain Descriptors / Indicators: Discomfort;Grimacing Pain Intervention(s): Monitored during session;Repositioned    Home Living                      Prior Function  PT Goals (current goals can now be found in the care plan section) Acute Rehab PT Goals Patient Stated Goal: to get out of bed    Frequency    Min 2X/week      PT Plan Current plan remains appropriate    Co-evaluation PT/OT/SLP Co-Evaluation/Treatment: Yes Reason for Co-Treatment: For patient/therapist safety PT goals addressed during session: Mobility/safety with mobility OT goals addressed during session: ADL's and  self-care;Strengthening/ROM      AM-PAC PT "6 Clicks" Mobility   Outcome Measure  Help needed turning from your back to your side while in a flat bed without using bedrails?: A Lot Help needed moving from lying on your back to sitting on the side of a flat bed without using bedrails?: Total Help needed moving to and from a bed to a chair (including a wheelchair)?: Total Help needed standing up from a chair using your arms (e.g., wheelchair or bedside chair)?: Total Help needed to walk in hospital room?: Total Help needed climbing 3-5 steps with a railing? : Total 6 Click Score: 7    End of Session Equipment Utilized During Treatment: Gait belt Activity Tolerance: Patient limited by fatigue Patient left: in chair;with call bell/phone within reach Nurse Communication: Mobility status;Need for lift equipment PT Visit Diagnosis: Muscle weakness (generalized) (M62.81)     Time: [id number] PT Time Calculation (min) (ACUTE ONLY): 42 min  Charges:  $Therapeutic Activity: 23-37 mins                     Tresa Endo PT Acute Rehabilitation Services  Office [phone]    Claretha Cooper 05/07/2019, 4:07 PM

## 2019-05-07 NOTE — Progress Notes (Signed)
Occupational Therapy Treatment Patient Details Name: Robert [name] MRN: [id number] DOB: [birth year] Today's Date: 05/07/2019    History of present illness 68 yo male [place] HTN,/CAD,GERD, DM,RA, T3 compression fx, wrist fx, COPD,ischemic myopathy presented to the hospital and was admitted on 04/11/2019 with shortness of breath, he was found to be significantly hypoxic, tested positive for COVID-19, was intubated and transferred to [facility] for further management   OT comments  Pt able to mobilize to chair with +2 Max A with use of Denna Haggard (3rd person used to maneuver flaps). Recommend staff use Maximove to mobilize pt back to bed. Pt with increased strength RUE (L shoulder deficits at baseline). Able to maintain upright midlein trunk control with min A. Increaesd aiblity to feed self but unable to tell this therapist his restrictions regarding need for nectar thick liquids. Pt more interactive with therapists and joking around. VSS on 3L. Feel pt is appropriate for rehab at [facility]. Pt on phone with son at end of session and updated son on progress. Pt/family very appreciative.   Follow Up Recommendations  CIR;Supervision/Assistance - 24 hour    Equipment Recommendations  3 in 1 bedside commode    Recommendations for Other Services Rehab consult    Precautions / Restrictions Precautions Precautions: Fall Precaution Comments: scrotal edema; foley Required Braces or Orthoses: Other Brace Other Brace: B Prevalon boots       Mobility Bed Mobility Overal bed mobility: Needs Assistance Bed Mobility: Supine to Sit Rolling: +2 for physical assistance;Mod assist         General bed mobility comments: increased ability to move legs and to use RUE to push up into sitting  Transfers Overall transfer level: Needs assistance   Transfers: Sit to/from Stand Sit to Stand: Max assist;+2 physical assistance(bed pad used)         General transfer comment: Staff will need to use Maximove     Balance Overall balance assessment: Needs assistance   Sitting balance-Leahy Scale: Poor Sitting balance - Comments: A required to maintina support     Standing balance-Leahy Scale: Zero                             ADL either performed or assessed with clinical judgement   ADL Overall ADL's : Needs assistance/impaired Eating/Feeding: Minimal assistance;Sitting Eating/Feeding Details (indicate cue type and reason): nectar thick liquids; able to feed/drink using R hand Grooming: Minimal assistance                               Functional mobility during ADLs: Maximal assistance;+2 for physical assistance       Vision       Perception     Praxis      Cognition Arousal/Alertness: Awake/alert Behavior During Therapy: WFL for tasks assessed/performed Overall Cognitive Status: Impaired/Different from baseline Area of Impairment: Attention;Awareness;Problem solving                   Current Attention Level: Selective       Awareness: Emergent Problem Solving: Slow processing General Comments: improving        Exercises Exercises: General Upper Extremity General Exercises - Upper Extremity Shoulder Flexion: AAROM;AROM;Strengthening;Both;10 reps;Supine Shoulder Extension: AROM;AAROM;Both;10 reps;Supine Shoulder ABduction: AROM;AAROM;Both;10 reps;Supine Elbow Flexion: AROM;AAROM;Both;10 reps;Seated Elbow Extension: AROM;AAROM;Both;10 reps;Seated Digit Composite Flexion: AROM;Both;10 reps   Shoulder Instructions       General Comments Pt  more interactive; joking appropriately with therapists; appropirate conversation with his son on the phone at end of sesison    Pertinent Vitals/ Pain       Pain Assessment: Faces Faces Pain Scale: Hurts little more Pain Location: general discomfort with mobility Pain Descriptors / Indicators: Discomfort;Grimacing Pain Intervention(s): Limited activity within patient's  tolerance;Repositioned  Home Living                                          Prior Functioning/Environment              Frequency  Min 3X/week        Progress Toward Goals  OT Goals(current goals can now be found in the care plan section)  Progress towards OT goals: Progressing toward goals  Acute Rehab OT Goals Patient Stated Goal: to get out of bed OT Goal Formulation: With patient Time For Goal Achievement: 05/19/19 Potential to Achieve Goals: Good ADL Goals Pt Will Perform Eating: with set-up;with supervision;sitting;with adaptive utensils Pt Will Perform Grooming: with set-up;with supervision;sitting Pt Will Perform Upper Body Bathing: with set-up;with supervision;sitting Pt/caregiver will Perform Home Exercise Program: Increased strength;Both right and left upper extremity;With theraband;With written HEP provided;With minimal assist Additional ADL Goal #1: Pt will complete sit - stand with Mod A +2 in preparation for toilet transfers Additional ADL Goal #2: Pt will participate in 15 minute ADL task with SpO2 remaining above 90.  Plan Discharge plan remains appropriate    Co-evaluation    PT/OT/SLP Co-Evaluation/Treatment: Yes Reason for Co-Treatment: Complexity of the patient's impairments (multi-system involvement);To address functional/ADL transfers;For patient/therapist safety   OT goals addressed during session: ADL's and self-care;Strengthening/ROM      AM-PAC OT "6 Clicks" Daily Activity     Outcome Measure   Help from another person eating meals?: A Little Help from another person taking care of personal grooming?: A Little Help from another person toileting, which includes using toliet, bedpan, or urinal?: Total Help from another person bathing (including washing, rinsing, drying)?: A Lot Help from another person to put on and taking off regular upper body clothing?: A Lot Help from another person to put on and taking off regular  lower body clothing?: Total 6 Click Score: 12    End of Session Equipment Utilized During Treatment: Gait belt;Oxygen(2L`)  OT Visit Diagnosis: Other abnormalities of gait and mobility (R26.89);Muscle weakness (generalized) (M62.81);Other symptoms and signs involving cognitive function;Pain Pain - part of body: Knee;Hip(scrotum)   Activity Tolerance Patient tolerated treatment well   Patient Left in chair;with call bell/phone within reach;with chair alarm set   Nurse Communication Mobility status;Need for lift equipment(Maximove)        Time: [id number] OT Time Calculation (min): 55 min  Charges: OT General Charges $OT Visit: 1 Visit OT Treatments $Self Care/Home Management : 8-22 mins $Therapeutic Exercise: 8-22 mins  Maurie Boettcher, OT/L   Acute OT Clinical Specialist [place] Pager [phone] Office [phone]    [facility] 05/07/2019, 1:37 PM

## 2019-05-07 NOTE — Plan of Care (Signed)
  Problem: Skin Integrity: Goal: Risk for impaired skin integrity will decrease Outcome: Progressing  Pt's wound care completed.  CHG Bath given.  Foley care given.  Pt turned on side.  Specialty mattress delivered.  Pt to be transferred into chair for lunch and back to bed after lunch.  Q2 turn for pt.     Problem: Safety: Goal: Ability to remain free from injury will improve Outcome: Progressing   Bed alarm and chair alarm on.  Pt educated on how to use the call bell for assistance.  Valuables within reach.  Pt comfortable in bed.

## 2019-05-07 NOTE — Progress Notes (Signed)
Called pt's daughter and updated her on her dads plan of care.  She appreciated the update.

## 2019-05-07 NOTE — Progress Notes (Signed)
PROGRESS NOTE  Robert [name] [id number] DOB: [birth year] DOA: 04/11/2019 PCP: Redmond School, MD   LOS: 25 days   Brief Narrative / Interim history: 68 year old male with history of COPD, CAD, diabetes mellitus, ischemic cardiomyopathy, RA, presented to the hospital and was admitted on 04/11/2019 with shortness of breath, he was found to be significantly hypoxic, tested positive for COVID-19, was intubated and transferred to [facility] for further management.  Hospital course complicated by persistent encephalopathy and difficulty weaning due to tachypnea and agitation, he failed extubation once on 8/2.  Hospital course was also complicated by MRSA pneumonia with septic shock at the beginning of August status post completed treatment. Patient eventually was eventually successfully extubated on 8/22 and transferred to the floor on 8/24  Significant events: 8/1 admission to [facility] ICU 8/2 extubated 8/3 early AM agitated, placed on BIPAP, required re-intubation 8/4 started on steroids for COPD exacerbation 8/8 septic shock requiring pressor support 8/9 off pressors, respiratory cultures +s.aureus 8/10 persistent encephalopathy off sedation, CT head ordered 8/11 encephalopathy improved 8/12 difficulty weaning due to tachypnea, ETT exchanged due to clot burden 8/13 PICC line inserted and subclavian central line removed 8/14 Antibiotics coverage broaden due to persistent fever and resp distress, pan cultured  8/19 more alert, tolerating pressure support 8/20 failed voiding trial, Foley needed to be reinserted 8/22 successfully extubated  8/24 transfer to the floor  Subjective: -Denies any chest pain, report generalized weakness, reports some pain in the left side,.  Assessment & Plan: Principal Problem:   Pneumonia due to COVID-19 virus Active Problems:   Rheumatoid arthritis ([place])   Hyperlipidemia   COPD (chronic obstructive pulmonary disease) (HCC)   Controlled type 2 diabetes mellitus with  diabetic peripheral angiopathy without gangrene, with long-term current use of insulin (HCC)   Essential hypertension   Ischemic cardiomyopathy   Coronary artery disease   Dyslipidemia (high LDL; low HDL)   Acute respiratory failure with hypoxemia (HCC)   COVID-19 virus detected   Acute respiratory distress syndrome (ARDS) due to COVID-19 virus   Atrial fibrillation with RVR (HCC)   COPD (chronic obstructive pulmonary disease) with emphysema (HCC)   AKI (acute kidney injury) ([place])   Diabetes mellitus type 2, uncontrolled, with complications (HCC)   Pressure injury of skin   Staphylococcal pneumonia ([place])   HCAP (healthcare-associated pneumonia)   Acute Hypoxic Respiratory Failure due to Covid-19 Viral Illness, COPD exacerbation -Extubated on 8/22, remains stable from respiratory standpoint, remains on 2 L nasal cannula this morning -Continue his inhalers for COPD, no longer wheezing -Repeat COVID-19 testing today as he may need SNF/CIR  Fever: Temp (24hrs), Avg:98.2 F (36.8 C), Min:97.6 F (36.4 C), Max:98.7 F (37.1 C)  Antibiotics:  Cefepime 7/31 >8/4  Flagyl 7/31  Remdesivir 8/1 >8/5  Actemra 8/1  Decadron/solumedrol 8/1 >8/8  Zosyn 8/8>8/9 Azithro 8/7>8/9 Cefepime 8/14 >8/19 Vanc 7/31, 8/8>8/22  COVID-19 interventions: Remdesivir: 8/1 >> 8/5 Steroids: decadron / solumedrol 8/1 >> 8/5 Actemra: 8/1 Convalescent Plasma: none  Vitamin C and Zinc: yes   COVID-19 Labs  Recent Labs    05/05/19 0455  DDIMER 2.51*  CRP 3.8*    Lab Results  Component Value Date   SARSCOV2NAA NOT DETECTED 05/06/2019   SARSCOV2NAA POSITIVE (A) 04/11/2019   SARSCOV2NAA Detected (A) 04/11/2019    Active Problems MRSA HCAP /septic shock -Off pressors now -Completed treatment with vancomycin for 3 weeks, last day was 8/22 -Respiratory status improving, extubated on 8/22 AM, currently stable on the floor  Paroxysmal A.  fib with RVR -Currently in sinus, on metoprolol 25mg  3  times daily -He was on IV heparin but no anticoagulation is on hold due to bleeding with ETT clotting as well as bleeding from the subclavian line -No further evidence of A. fib on telemetry, his A. fib episode most likely related to his acute illness secondary to COVID-19 infection and respiratory failure . has not recurred.  Will need anticoagulation if A. fib recurs.  Acute on chronic diastolic CHF, essential hypertension, history of CAD -History of PCI in 2018 with in-stent restenosis, currently on aspirin and DVT prophylaxis -Resume Plavix -Continue Lasix, closely monitor renal function, change to oral Lasix starting tomorrow  Deconditioning/ICU induced muscle weakness -Aggressive PT/OT -On dysphagia 3 with thick liquids per speech  Diet Orders (From admission, onward)    Start     Ordered   05/05/19 1123  DIET DYS 3 Room service appropriate? Yes; Fluid consistency: Nectar Thick  Diet effective now    Question Answer Comment  Room service appropriate? Yes   Fluid consistency: Nectar Thick      05/05/19 1122         Hypernatremia -Resolved, sodium now normal  Acute kidney injury -Creatinine peaked as high as 2.39 on 04/24/2019, possibly multifactorial in the setting of diuresis and use of vancomycin -He was also found to have retention requiring Foley catheter placement. -Creatinine 1.16 this morning, monitor  Acute urinary obstruction -In the setting of ICU stay. Failed voiding trial on 8/20 and Foley had to be placed back.  Continue Flomax -We will attempt voiding trial today  ETT clotting/bleeding from the subclavian line -Patient was on therapeutic heparin, ET tube was clotted and exchange on 04/23/2019.  Currently on heparin prophylaxis -Hemoglobin stable  Thrombocytopenia -Chronic, stable  Type 2 diabetes mellitus, uncontrolled with hyperglycemia with vascular disease and neuropathy at baseline -CBGs fair, continue to monitor  CBG (last 3)  Recent Labs     05/07/19 0333 05/07/19 0732 05/07/19 1207  GLUCAP 149* 132* 164*   RA -He is on Actemra and methotrexate at home, currently on hold  Pressure ulcer of medial sacrum bilateral buttocks Pressure Injury 04/17/19 Sacrum Medial Deep Tissue Injury - Purple or maroon localized area of discolored intact skin or blood-filled blister due to damage of underlying soft tissue from pressure and/or shear. dark purple with some skin sloughing on lowe (Active)  04/17/19 0730  Location: Sacrum  Location Orientation: Medial  Staging: Deep Tissue Injury - Purple or maroon localized area of discolored intact skin or blood-filled blister due to damage of underlying soft tissue from pressure and/or shear.  Wound Description (Comments): dark purple with some skin sloughing on lower sacral area  Present on Admission: No     Pressure Injury 04/17/19 Buttocks Right;Lower Unstageable - Full thickness tissue loss in which the base of the ulcer is covered by slough (yellow, tan, gray, green or brown) and/or eschar (tan, brown or black) in the wound bed. (Active)  04/17/19 0730  Location: Buttocks  Location Orientation: Right;Lower  Staging: Unstageable - Full thickness tissue loss in which the base of the ulcer is covered by slough (yellow, tan, gray, green or brown) and/or eschar (tan, brown or black) in the wound bed.  Wound Description (Comments):   Present on Admission: No     Pressure Injury 04/17/19 Buttocks Left;Lower WOC updated to Stage 3 on 05/06/19 at the time of assessment (Active)  04/17/19 0730  Location: Buttocks  Location Orientation: Left;Lower  Staging:  Wound Description (Comments): WOC updated to Stage 3 on 05/06/19 at the time of assessment  Present on Admission: No     Scheduled Meds: . albuterol  2 puff Inhalation Q6H  . aspirin  81 mg Oral Daily  . bethanechol  10 mg Oral BID  . chlorhexidine  15 mL Mouth Rinse BID  . Chlorhexidine Gluconate Cloth  6 each Topical Daily  .  clopidogrel  75 mg Oral Daily  . collagenase   Topical Daily  . enoxaparin (LOVENOX) injection  50 mg Subcutaneous Q24H  . feeding supplement (ENSURE ENLIVE)  237 mL Oral TID BM  . folic acid  1 mg Oral Daily  . furosemide  40 mg Oral Daily  . gabapentin  100 mg Oral Q8H  . insulin aspart  0-15 Units Subcutaneous Q4H  . insulin glargine  15 Units Subcutaneous Daily  . mouth rinse  15 mL Mouth Rinse q12n4p  . metoprolol tartrate  25 mg Oral TID  . mometasone-formoterol  2 puff Inhalation BID  . multivitamin with minerals  1 tablet Oral Daily  . sodium chloride flush  10-40 mL Intracatheter Q12H  . tamsulosin  0.8 mg Oral QPC supper   Continuous Infusions: . sodium chloride Stopped (05/05/19 1136)   PRN Meds:.sodium chloride, acetaminophen, dextrose, hydrALAZINE, Resource ThickenUp Clear, sodium chloride flush, Thrombi-Pad   DVT prophylaxis: Lovenox Code Status: Full code Family Communication: Discussed with patient Disposition Plan: Requested CIR consult  Consultants:   PCCM  Micro data: 7/31 blood >NGTD 7/31 sars-cov-2 >positive 8/1 Resp CX > Normal flora 8/7 Resp CX >MRSA 8/7 BCX > NGTD 8/8 UCX > NGTD 8/15 Resp culture>  MRSA    Objective: Vitals:   05/07/19 0030 05/07/19 0235 05/07/19 0445 05/07/19 0733  BP: 127/61  125/65 125/70  Pulse:   78 81  Resp: (!) 23  18 (!) 23  Temp: 98 F (36.7 C)  97.6 F (36.4 C) 98.4 F (36.9 C)  TempSrc: Oral     SpO2: 94%  92% 94%  Weight:  106.1 kg    Height:        Intake/Output Summary (Last 24 hours) at 05/07/2019 1420 Last data filed at 05/07/2019 1300 Gross per 24 hour  Intake 1320 ml  Output 4300 ml  Net -2980 ml   Filed Weights   05/05/19 0300 05/06/19 0500 05/07/19 0235  Weight: 104.2 kg 111 kg 106.1 kg    Examination:  Awake Alert, Oriented X 3, No new F.N deficits, Normal affect Symmetrical Chest wall movement, Good air movement bilaterally, CTAB RRR,No Gallops,Rubs or new Murmurs, No Parasternal  Heave +ve B.Sounds, Abd Soft, No tenderness, No rebound - guarding or rigidity. No Cyanosis, Clubbing or edema, No new Rash or bruise     Data Reviewed: I have independently reviewed following labs and imaging studies    CBC: Recent Labs  Lab 05-04-2019 0445 05/02/19 0510 05/03/19 0435 05/04/19 0515 05/04/19 0941 05/05/19 0455 05/06/19 0640 05/07/19 0130  WBC 7.5 6.5 7.1 7.1  --  6.6 5.6 5.6  NEUTROABS 6.0 5.0 5.7 5.7  --  5.1  --   --   HGB 8.8* 8.9* 8.8* 9.0* 8.2* 8.8* 10.4* 8.7*  HCT 28.8* 28.7* 28.7* 29.1* 24.0* 28.0* 33.0* 27.2*  MCV 99.0 98.0 97.3 97.0  --  96.2 94.6 94.8  PLT 106* 98* 102* 99*  --  92* 90* 93*   Basic Metabolic Panel: Recent Labs  Lab May 04, 2019 0445 05/02/19 0510 05/03/19 0435 05/04/19 0515 05/04/19 0941  05/05/19 0455 05/06/19 0640 05/07/19 0130  NA 148* 150* 150* 151* 151* 149* 142 141  K 4.3 4.5 4.4 4.1 3.9 4.1 3.9 4.0  CL 118* 121* 118* 118*  --  115* 107 106  CO2 26 25 25 28   --  27 26 27   GLUCOSE 162* 113* 108* 148*  --  222* 176* 175*  BUN 60* 62* 65* 66*  --  68* 55* 51*  CREATININE 1.18 1.27* 1.28* 1.26*  --  1.22 1.16 1.25*  CALCIUM 8.2* 8.5* 8.7* 8.7*  --  8.7* 8.6* 8.7*  MG 2.3 2.2 2.2 2.1  --  2.1  --   --    GFR: Estimated Creatinine Clearance: 73.2 mL/min (A) (by C-G formula based on SCr of 1.25 mg/dL (H)). Liver Function Tests: Recent Labs  Lab 05/02/19 0510 05/03/19 0435 05/04/19 0515 05/05/19 0455 05/07/19 0130  AST 47* 47* 41 29 31  ALT 80* 79* 72* 62* 57*  ALKPHOS 83 92 90 94 98  BILITOT 0.2* 0.4 0.4 0.3 0.6  PROT 5.5* 5.7* 5.8* 5.7* 5.9*  ALBUMIN 2.0* 2.1* 2.2* 2.2* 2.3*   No results for input(s): LIPASE, AMYLASE in the last 168 hours. Recent Labs  Lab 05/04/19 1043  AMMONIA 22   Coagulation Profile: No results for input(s): INR, PROTIME in the last 168 hours. Cardiac Enzymes: No results for input(s): CKTOTAL, CKMB, CKMBINDEX, TROPONINI in the last 168 hours. BNP (last 3 results) No results for  input(s): PROBNP in the last 8760 hours. HbA1C: No results for input(s): HGBA1C in the last 72 hours. CBG: Recent Labs  Lab 05/06/19 1940 05/06/19 2341 05/07/19 0333 05/07/19 0732 05/07/19 1207  GLUCAP 398* 190* 149* 132* 164*   Lipid Profile: No results for input(s): CHOL, HDL, LDLCALC, TRIG, CHOLHDL, LDLDIRECT in the last 72 hours. Thyroid Function Tests: No results for input(s): TSH, T4TOTAL, FREET4, T3FREE, THYROIDAB in the last 72 hours. Anemia Panel: No results for input(s): VITAMINB12, FOLATE, FERRITIN, TIBC, IRON, RETICCTPCT in the last 72 hours. Urine analysis:    Component Value Date/Time   COLORURINE YELLOW 04/25/2019 0826   APPEARANCEUR HAZY (A) 04/25/2019 0826   LABSPEC 1.015 04/25/2019 0826   PHURINE 5.0 04/25/2019 0826   GLUCOSEU 150 (A) 04/25/2019 0826   HGBUR LARGE (A) 04/25/2019 0826   BILIRUBINUR NEGATIVE 04/25/2019 0826   KETONESUR NEGATIVE 04/25/2019 0826   PROTEINUR 30 (A) 04/25/2019 0826   UROBILINOGEN 0.2 12/22/2011 2130   NITRITE NEGATIVE 04/25/2019 0826   LEUKOCYTESUR NEGATIVE 04/25/2019 0826   Sepsis Labs: Invalid input(s): PROCALCITONIN, LACTICIDVEN  Recent Results (from the past 240 hour(s))  Novel Coronavirus, NAA (hospital order; send-out to ref lab)     Status: None   Collection Time: 05/06/19 10:16 AM   Specimen: Nasopharyngeal Swab; Respiratory  Result Value Ref Range Status   SARS-CoV-2, NAA NOT DETECTED NOT DETECTED Final    Comment: (NOTE) This test was developed and its performance characteristics determined by [employer]. This test has not been FDA cleared or approved. This test has been authorized by FDA under an Emergency Use Authorization (EUA). This test is only authorized for the duration of time the declaration that circumstances exist justifying the authorization of the emergency use of in vitro diagnostic tests for detection of SARS-CoV-2 virus and/or diagnosis of COVID-19 infection under section 564(b)(1) of  the Act, 21 U.S.C. [id number]), unless the authorization is terminated or revoked sooner. When diagnostic testing is negative, the possibility of a false negative result should be considered in the  context of a patient's recent exposures and the presence of clinical signs and symptoms consistent with COVID-19. An individual without symptoms of COVID-19 and who is not shedding SARS-CoV-2 virus would expect to have a negative (not detected) result in this assay. Performed  At: [facility] [place], [place] [id number] Rush Farmer MD [id number]    [place]  Final    Comment: Performed at [facility] [address] [place], [place] [address]     Radiology Studies: No results found.   Phillips Climes MD [facility]  Contact via  [email or web address]  [place] P: [phone] F: [phone]

## 2019-05-08 LAB — GLUCOSE, CAPILLARY
Glucose-Capillary: 146 mg/dL — ABNORMAL HIGH (ref 70–99)
Glucose-Capillary: 191 mg/dL — ABNORMAL HIGH (ref 70–99)
Glucose-Capillary: 243 mg/dL — ABNORMAL HIGH (ref 70–99)
Glucose-Capillary: 291 mg/dL — ABNORMAL HIGH (ref 70–99)
Glucose-Capillary: 352 mg/dL — ABNORMAL HIGH (ref 70–99)

## 2019-05-08 LAB — CBC
HCT: 27.7 % — ABNORMAL LOW (ref 39.0–52.0)
Hemoglobin: 8.9 g/dL — ABNORMAL LOW (ref 13.0–17.0)
MCH: 30.3 pg (ref 26.0–34.0)
MCHC: 32.1 g/dL (ref 30.0–36.0)
MCV: 94.2 fL (ref 80.0–100.0)
Platelets: 96 10*3/uL — ABNORMAL LOW (ref 150–400)
RBC: 2.94 MIL/uL — ABNORMAL LOW (ref 4.22–5.81)
RDW: 17.4 % — ABNORMAL HIGH (ref 11.5–15.5)
WBC: 5.2 10*3/uL (ref 4.0–10.5)
nRBC: 0 % (ref 0.0–0.2)

## 2019-05-08 MED ORDER — ENOXAPARIN SODIUM 40 MG/0.4ML ~~LOC~~ SOLN
40.0000 mg | SUBCUTANEOUS | Status: DC
  Administered 2019-05-08 – 2019-05-13 (×6): 40 mg via SUBCUTANEOUS
  Filled 2019-05-08 (×6): qty 0.4

## 2019-05-08 NOTE — Progress Notes (Signed)
Occupational Therapy Treatment Patient Details Name: Robert [name] MRN: [id number] DOB: 23-Nov-1950 Today's Date: 05/08/2019    History of present illness 68 yo male [place] HTN,/CAD,GERD, DM,RA, T3 compression fx, wrist fx, COPD,ischemic myopathy presented to the hospital and was admitted on 04/11/2019 with shortness of breath, he was found to be significantly hypoxic, tested positive for COVID-19, was intubated and transferred to [facility] for further management   OT comments  Entered room with both legs close to being off bed with pt hitting bed control against bedside table trying to get nursing attention. States he could not find his call button, which was located beside him.Pt saying that he needed to have a BM. Pt assisted with rolling and self care at bed level after BM. Pt with increased BU/LE strength and using rails and able to push with RLE to help with mobility. Pt able to bath UB with set up and assist with rolling with Mod A for pericare and to change sacral dressings. Pt demonstrating increased awareness and improved cognition. Pt stated that he knew he couldn't get OOB by himself and that his legs just "slid off the bed." Excellent CIR candidate. Pt very appreciative. Encourage staff to mobilize OOB with [employer] daily. Will continue to follow acutely.   Follow Up Recommendations  CIR;Supervision/Assistance - 24 hour    Equipment Recommendations  3 in 1 bedside commode    Recommendations for Other Services Rehab consult    Precautions / Restrictions Precautions Precautions: Fall       Mobility Bed Mobility Overal bed mobility: Needs Assistance Bed Mobility: Rolling Rolling: Mod assist         General bed mobility comments: Pt able to bridge and assist with rolling today. Assist to hlep position arm and rail and pt pulling with using rail.  Transfers                 General transfer comment: encouraged staff to mobilize usingMaximove    Balance     Sitting  balance-Leahy Scale: Fair Sitting balance - Comments: supoprted in bed                                   ADL either performed or assessed with clinical judgement   ADL Overall ADL's : Needs assistance/impaired     Grooming: Minimal assistance;Bed level   Upper Body Bathing: Supervision/ safety;Set up;Bed level   Lower Body Bathing: Maximal assistance;Bed level Lower Body Bathing Details (indicate cue type and reason): Pt ablet o reach and clean groin area; would most likely benefit form long handled sponge             Toileting- Clothing Manipulation and Hygiene: Total assistance Toileting - Clothing Manipulation Details (indicate cue type and reason): foley; partly incontinent     Functional mobility during ADLs: Maximal assistance General ADL Comments: Educated pt on importance of him taking part and doin more for himself during ADL; pt verbalized understnaidng.     Vision       Perception     Praxis      Cognition Arousal/Alertness: Awake/alert Behavior During Therapy: WFL for tasks assessed/performed Overall Cognitive Status: Impaired/Different from baseline Area of Impairment: Attention;Awareness;Problem solving                   Current Attention Level: Selective       Awareness: Emergent Problem Solving: Slow processing General Comments: improving, increased awareness  by asking for bedpan although had already started using the bathroom        Exercises General Exercises - Upper Extremity Shoulder Flexion: AROM;AAROM;Both;10 reps;Supine Shoulder Extension: AROM;AAROM;Both;10 reps;Supine Shoulder ABduction: AROM;AAROM;Both;10 reps;Supine Elbow Flexion: AROM;Both;10 reps;Supine Elbow Extension: AROM;Both;10 reps;Supine   Shoulder Instructions       General Comments      Pertinent Vitals/ Pain       Pain Assessment: Faces Faces Pain Scale: Hurts a little bit Pain Location: general discomfort with mobility, scrotal pain  sitting Pain Descriptors / Indicators: Discomfort;Grimacing Pain Intervention(s): Limited activity within patient's tolerance  Home Living                                          Prior Functioning/Environment              Frequency  Min 3X/week        Progress Toward Goals  OT Goals(current goals can now be found in the care plan section)  Progress towards OT goals: Progressing toward goals  Acute Rehab OT Goals Patient Stated Goal: to use the bedpan OT Goal Formulation: With patient Time For Goal Achievement: 05/19/19 Potential to Achieve Goals: Good ADL Goals Pt Will Perform Eating: with set-up;with supervision;sitting;with adaptive utensils Pt Will Perform Grooming: with set-up;with supervision;sitting Pt Will Perform Upper Body Bathing: with set-up;with supervision;sitting Pt/caregiver will Perform Home Exercise Program: Increased strength;Both right and left upper extremity;With theraband;With written HEP provided;With minimal assist Additional ADL Goal #1: Pt will complete sit - stand with Mod A +2 in preparation for toilet transfers Additional ADL Goal #2: Pt will participate in 15 minute ADL task with SpO2 remaining above 90.  Plan Discharge plan remains appropriate    Co-evaluation                 AM-PAC OT "6 Clicks" Daily Activity     Outcome Measure   Help from another person eating meals?: A Little Help from another person taking care of personal grooming?: A Little Help from another person toileting, which includes using toliet, bedpan, or urinal?: Total Help from another person bathing (including washing, rinsing, drying)?: A Lot Help from another person to put on and taking off regular upper body clothing?: A Lot Help from another person to put on and taking off regular lower body clothing?: Total 6 Click Score: 12    End of Session Equipment Utilized During Treatment: Oxygen  OT Visit Diagnosis: Other abnormalities of  gait and mobility (R26.89);Muscle weakness (generalized) (M62.81);Other symptoms and signs involving cognitive function;Pain Pain - part of body: (butticks/scrotal area)   Activity Tolerance Patient tolerated treatment well   Patient Left in bed;with call bell/phone within reach;with nursing/sitter in room   Nurse Communication Mobility status;Need for lift equipment        Time: 1010-1047 OT Time Calculation (min): 37 min  Charges: OT General Charges $OT Visit: 1 Visit OT Treatments $Self Care/Home Management : 23-37 mins  Maurie Boettcher, OT/L   Acute OT Clinical Specialist [place] Pager [phone] Office [phone]    [facility] 05/08/2019, 1:15 PM

## 2019-05-08 NOTE — Progress Notes (Signed)
Attempted to call pt's family member for update on plan of care twice during the shift.  Pt's family member didn't answer both times.

## 2019-05-08 NOTE — Progress Notes (Signed)
Inpatient Rehab Admissions:  Inpatient Rehab Consult received.  Note that pt's most recent (+) test was on 05/07/2019 (previously negative on 8/25).  Will continue to follow until patient meets [facility] admissions guidelines.    Signed: Shann Medal, PT, DPT Admissions Coordinator [phone] 05/08/19  9:12 AM

## 2019-05-08 NOTE — Plan of Care (Signed)
  Problem: Respiratory: Goal: Will maintain a patent airway Outcome: Progressing Goal: Complications related to the disease process, condition or treatment will be avoided or minimized Outcome: Progressing  Pt using incentive spirometer and flutter valve every hour.  Pt's o2 titrated down to 1 L [place] satting at 94%.  Goal per MD to keep pt 93-94%.

## 2019-05-08 NOTE — Progress Notes (Signed)
  Speech Language Pathology Treatment: Dysphagia  Patient Details Name: Robert [name] MRN: [id number] DOB: [birth year] Today's Date: 05/08/2019 Time: [id number] SLP Time Calculation (min) (ACUTE ONLY): 8 min  Assessment / Plan / Recommendation Clinical Impression  Pt still mildly dysphonic. Attempted trials of thin liquids though immediate cough response after each sips consistently persists. Discussed the significant with pt. He reported he just wanted cold drinks. Since pt drinks so quickly, adding ice to his drinks is unlikely to dilute them at all. Pt drank 4 oz of nectar thick cranberry juice with a straw without coughing, though SLP did provide min verbal cues for a preventative throat clear. Recommend pt continue current diet. Will f/u for upgrade next week.   HPI HPI: 68 year old male with history of COPD, CAD, diabetes mellitus, ischemic cardiomyopathy, RA, presented to the hospital and was admitted on 04/11/2019 with shortness of breath, he was found to be significantly hypoxic, tested positive for COVID-19, was intubated and transferred to [facility] for further management. Intubated 8/1-8/2, reintubated 8/3-8/22.       SLP Plan  Continue with current plan of care       Recommendations  Diet recommendations: Nectar-thick liquid;Dysphagia 3 (mechanical soft) Liquids provided via: Cup;Straw Medication Administration: Whole meds with puree Supervision: Patient able to self feed Compensations: Multiple dry swallows after each bite/sip;Clear throat intermittently Postural Changes and/or Swallow Maneuvers: Seated upright 90 degrees                Plan: Continue with current plan of care       GO               [name], MA CCC-SLP  Acute Rehabilitation Services Pager [phone] Office [phone]  [name] 05/08/2019, 2:23 PM

## 2019-05-08 NOTE — Progress Notes (Signed)
His hgb trended down to 8.7 yesterday. His BMI is <35 and ddimer<5. We will adjust his lovenox to standard dose.   Change Lovenox 40mg  SQ qday  Onnie Boer, PharmD, BCIDP, AAHIVP, CPP Infectious Disease Pharmacist 05/08/2019 10:09 AM

## 2019-05-08 NOTE — Progress Notes (Signed)
PROGRESS NOTE  Robert [name] [id number] DOB: Mar 26, 1951 DOA: 04/11/2019 PCP: Redmond School, MD   LOS: 26 days   Brief Narrative / Interim history:  68 year old male with history of COPD, CAD, diabetes mellitus, ischemic cardiomyopathy, RA, presented to the hospital and was admitted on 04/11/2019 with shortness of breath, he was found to be significantly hypoxic, tested positive for COVID-19, was intubated and transferred to [facility] for further management.  Hospital course complicated by persistent encephalopathy and difficulty weaning due to tachypnea and agitation, he failed extubation once on 8/2.  Hospital course was also complicated by MRSA pneumonia with septic shock at the beginning of August status post completed treatment. Patient eventually was eventually successfully extubated on 8/22 and transferred to the floor on 8/24  Significant events: 8/1 admission to [facility] ICU 8/2 extubated 8/3 early AM agitated, placed on BIPAP, required re-intubation 8/4 started on steroids for COPD exacerbation 8/8 septic shock requiring pressor support 8/9 off pressors, respiratory cultures +s.aureus 8/10 persistent encephalopathy off sedation, CT head ordered 8/11 encephalopathy improved 8/12 difficulty weaning due to tachypnea, ETT exchanged due to clot burden 8/13 PICC line inserted and subclavian central line removed 8/14 Antibiotics coverage broaden due to persistent fever and resp distress, pan cultured  8/19 more alert, tolerating pressure support 8/20 failed voiding trial, Foley needed to be reinserted 8/22 successfully extubated  8/24 transfer to the floor  Subjective: -Denies any chest pain, report generalized weakness, reports he spent 1 hour on the recliner yesterday.  Assessment & Plan: Principal Problem:   Pneumonia due to COVID-19 virus Active Problems:   Rheumatoid arthritis ([place])   Hyperlipidemia   COPD (chronic obstructive pulmonary disease) (HCC)   Controlled type 2  diabetes mellitus with diabetic peripheral angiopathy without gangrene, with long-term current use of insulin (HCC)   Essential hypertension   Ischemic cardiomyopathy   Coronary artery disease   Dyslipidemia (high LDL; low HDL)   Acute respiratory failure with hypoxemia (HCC)   COVID-19 virus detected   Acute respiratory distress syndrome (ARDS) due to COVID-19 virus   Atrial fibrillation with RVR (HCC)   COPD (chronic obstructive pulmonary disease) with emphysema (HCC)   AKI (acute kidney injury) ([place])   Diabetes mellitus type 2, uncontrolled, with complications (HCC)   Pressure injury of skin   Staphylococcal pneumonia ([place])   HCAP (healthcare-associated pneumonia)   Acute Hypoxic Respiratory Failure due to Covid-19 Viral Illness, COPD exacerbation -Extubated on 8/22, remains stable from respiratory standpoint, this morning he remains on 2 L nasal cannula, discussed with staff, they will try to wean down, patient reports he had history of smoking, 1.5 pack/day until admission, might be having some underlining COPD, and may still have some oxygen requirement at baseline. -Continue his inhalers for COPD, no longer wheezing  Fever: Temp (24hrs), Avg:98 F (36.7 C), Min:97.6 F (36.4 C), Max:98.2 F (36.8 C)  Antibiotics:  Cefepime 7/31 >8/4  Flagyl 7/31  Remdesivir 8/1 >8/5  Actemra 8/1  Decadron/solumedrol 8/1 >8/8  Zosyn 8/8>8/9 Azithro 8/7>8/9 Cefepime 8/14 >8/19 Vanc 7/31, 8/8>8/22  COVID-19 interventions: Remdesivir: 8/1 >> 8/5 Steroids: decadron / solumedrol 8/1 >> 8/5 Actemra: 8/1 Convalescent Plasma: none  Vitamin C and Zinc: yes   COVID-19 Labs  No results for input(s): DDIMER, FERRITIN, LDH, CRP in the last 72 hours.  Lab Results  Component Value Date   SARSCOV2NAA POSITIVE (A) 05/07/2019   SARSCOV2NAA NOT DETECTED 05/06/2019   SARSCOV2NAA POSITIVE (A) 04/11/2019   SARSCOV2NAA Detected (A) 04/11/2019    Active  Problems MRSA HCAP /septic shock  -Off pressors now -Completed treatment with vancomycin for 3 weeks, last day was 8/22 -Respiratory status improving, extubated on 8/22 AM, currently stable on the floor  Paroxysmal A. fib with RVR -Currently in sinus, on metoprolol 25mg  3 times daily -He was on IV heparin but no anticoagulation is on hold due to bleeding with ETT clotting as well as bleeding from the subclavian line -No further evidence of A. fib on telemetry, his A. fib episode most likely related to his acute illness secondary to COVID-19 infection and respiratory failure .  So far remains in sinus rhythm, with no A. fib recurrence,   Will need anticoagulation if A. fib recurs.  Acute on chronic diastolic CHF, essential hypertension, history of CAD -History of PCI in 2018 with in-stent restenosis, -He denies any chest pain, continue with aspirin, Plavix. -Continue Lasix, closely monitor renal function.  Deconditioning/ICU induced muscle weakness -Aggressive PT/OT -On dysphagia 3 with thick liquids per speech, will need continuous SLP follow-up  Diet Orders (From admission, onward)    Start     Ordered   05/05/19 1123  DIET DYS 3 Room service appropriate? Yes; Fluid consistency: Nectar Thick  Diet effective now    Question Answer Comment  Room service appropriate? Yes   Fluid consistency: Nectar Thick      05/05/19 1122         Hypernatremia -Resolved, sodium now normal  Acute kidney injury -Creatinine peaked as high as 2.39 on 04/24/2019, possibly multifactorial in the setting of diuresis and use of vancomycin -He was also found to have retention requiring Foley catheter placement. -Creatinine 1.16 this morning, monitor  Acute urinary obstruction -In the setting of ICU stay. Failed voiding trial on 8/20 and Foley had to be placed back.  Continue Flomax -We will attempt voiding trial today  ETT clotting/bleeding from the subclavian line -Patient was on therapeutic heparin, ET tube was clotted and exchange  on 04/23/2019.  Currently on heparin prophylaxis -Hemoglobin stable  Thrombocytopenia -Chronic, stable  Type 2 diabetes mellitus, uncontrolled with hyperglycemia with vascular disease and neuropathy at baseline -CBGs fair, continue to monitor  CBG (last 3)  Recent Labs    05/08/19 0013 05/08/19 0340 05/08/19 0758  GLUCAP 243* 191* 146*   RA -He is on Actemra and methotrexate at home, currently on hold  Pressure ulcer of medial sacrum bilateral buttocks Pressure Injury 04/17/19 Sacrum Medial Deep Tissue Injury - Purple or maroon localized area of discolored intact skin or blood-filled blister due to damage of underlying soft tissue from pressure and/or shear. dark purple with some skin sloughing on lowe (Active)  04/17/19 0730  Location: Sacrum  Location Orientation: Medial  Staging: Deep Tissue Injury - Purple or maroon localized area of discolored intact skin or blood-filled blister due to damage of underlying soft tissue from pressure and/or shear.  Wound Description (Comments): dark purple with some skin sloughing on lower sacral area  Present on Admission: No     Pressure Injury 04/17/19 Buttocks Right;Lower Unstageable - Full thickness tissue loss in which the base of the ulcer is covered by slough (yellow, tan, gray, green or brown) and/or eschar (tan, brown or black) in the wound bed. (Active)  04/17/19 0730  Location: Buttocks  Location Orientation: Right;Lower  Staging: Unstageable - Full thickness tissue loss in which the base of the ulcer is covered by slough (yellow, tan, gray, green or brown) and/or eschar (tan, brown or black) in the wound bed.  Wound  Description (Comments):   Present on Admission: No     Pressure Injury 04/17/19 Buttocks Left;Lower WOC updated to Stage 3 on 05/06/19 at the time of assessment (Active)  04/17/19 0730  Location: Buttocks  Location Orientation: Left;Lower  Staging:   Wound Description (Comments): WOC updated to Stage 3 on 05/06/19 at  the time of assessment  Present on Admission: No     Scheduled Meds: . albuterol  2 puff Inhalation Q6H  . aspirin  81 mg Oral Daily  . bethanechol  10 mg Oral BID  . chlorhexidine  15 mL Mouth Rinse BID  . Chlorhexidine Gluconate Cloth  6 each Topical Daily  . clopidogrel  75 mg Oral Daily  . collagenase   Topical Daily  . enoxaparin (LOVENOX) injection  40 mg Subcutaneous Q24H  . feeding supplement (ENSURE ENLIVE)  237 mL Oral TID BM  . folic acid  1 mg Oral Daily  . furosemide  40 mg Oral Daily  . gabapentin  100 mg Oral Q8H  . insulin aspart  0-15 Units Subcutaneous Q4H  . insulin glargine  15 Units Subcutaneous Daily  . mouth rinse  15 mL Mouth Rinse q12n4p  . metoprolol tartrate  25 mg Oral TID  . mometasone-formoterol  2 puff Inhalation BID  . multivitamin with minerals  1 tablet Oral Daily  . sodium chloride flush  10-40 mL Intracatheter Q12H  . tamsulosin  0.8 mg Oral QPC supper   Continuous Infusions: . sodium chloride Stopped (05/05/19 1136)   PRN Meds:.sodium chloride, acetaminophen, dextrose, hydrALAZINE, Resource ThickenUp Clear, sodium chloride flush, Thrombi-Pad   DVT prophylaxis: Lovenox Code Status: Full code Family Communication: Discussed with patient, discussed with daughter via phone today Disposition Plan: Need placement, likely will benefit from CIR, but he is still testing positive for COVID-19  Consultants:   PCCM  Micro data: 7/31 blood >NGTD 7/31 sars-cov-2 >positive 8/1 Resp CX > Normal flora 8/7 Resp CX >MRSA 8/7 BCX > NGTD 8/8 UCX > NGTD 8/15 Resp culture>  MRSA    Objective: Vitals:   05/07/19 2126 05/08/19 0011 05/08/19 0337 05/08/19 0822  BP:  107/63 (!) 116/52 123/72  Pulse: 83 75  79  Resp:      Temp:  98.1 F (36.7 C) 98.1 F (36.7 C) 97.6 F (36.4 C)  TempSrc:  Oral Oral Oral  SpO2:  96% 91% 94%  Weight:      Height:        Intake/Output Summary (Last 24 hours) at 05/08/2019 1110 Last data filed at 05/07/2019  1800 Gross per 24 hour  Intake 500 ml  Output 1900 ml  Net -1400 ml   Filed Weights   05/05/19 0300 05/06/19 0500 05/07/19 0235  Weight: 104.2 kg 111 kg 106.1 kg    Examination:  Awake Alert, Oriented X 3, No new F.N deficits, Normal affect Symmetrical Chest wall movement, Good air movement bilaterally, CTAB RRR,No Gallops,Rubs or new Murmurs, No Parasternal Heave +ve B.Sounds, Abd Soft, No tenderness, No rebound - guarding or rigidity. No Cyanosis, Clubbing or edema, No new Rash or bruise      Data Reviewed: I have independently reviewed following labs and imaging studies    CBC: Recent Labs  Lab 2019/05/12 0510 05/03/19 0435 05/04/19 0515 05/04/19 0941 05/05/19 0455 05/06/19 0640 05/07/19 0130  WBC 6.5 7.1 7.1  --  6.6 5.6 5.6  NEUTROABS 5.0 5.7 5.7  --  5.1  --   --   HGB 8.9* 8.8* 9.0* 8.2*  8.8* 10.4* 8.7*  HCT 28.7* 28.7* 29.1* 24.0* 28.0* 33.0* 27.2*  MCV 98.0 97.3 97.0  --  96.2 94.6 94.8  PLT 98* 102* 99*  --  92* 90* 93*   Basic Metabolic Panel: Recent Labs  Lab 05/02/19 0510 05/03/19 0435 05/04/19 0515 05/04/19 0941 05/05/19 0455 05/06/19 0640 05/07/19 0130  NA 150* 150* 151* 151* 149* 142 141  K 4.5 4.4 4.1 3.9 4.1 3.9 4.0  CL 121* 118* 118*  --  115* 107 106  CO2 25 25 28   --  27 26 27   GLUCOSE 113* 108* 148*  --  222* 176* 175*  BUN 62* 65* 66*  --  68* 55* 51*  CREATININE 1.27* 1.28* 1.26*  --  1.22 1.16 1.25*  CALCIUM 8.5* 8.7* 8.7*  --  8.7* 8.6* 8.7*  MG 2.2 2.2 2.1  --  2.1  --   --    GFR: Estimated Creatinine Clearance: 73.2 mL/min (A) (by C-G formula based on SCr of 1.25 mg/dL (H)). Liver Function Tests: Recent Labs  Lab 05/02/19 0510 05/03/19 0435 05/04/19 0515 05/05/19 0455 05/07/19 0130  AST 47* 47* 41 29 31  ALT 80* 79* 72* 62* 57*  ALKPHOS 83 92 90 94 98  BILITOT 0.2* 0.4 0.4 0.3 0.6  PROT 5.5* 5.7* 5.8* 5.7* 5.9*  ALBUMIN 2.0* 2.1* 2.2* 2.2* 2.3*   No results for input(s): LIPASE, AMYLASE in the last 168 hours.  Recent Labs  Lab 05/04/19 1043  AMMONIA 22   Coagulation Profile: No results for input(s): INR, PROTIME in the last 168 hours. Cardiac Enzymes: No results for input(s): CKTOTAL, CKMB, CKMBINDEX, TROPONINI in the last 168 hours. BNP (last 3 results) No results for input(s): PROBNP in the last 8760 hours. HbA1C: No results for input(s): HGBA1C in the last 72 hours. CBG: Recent Labs  Lab 05/07/19 1552 05/07/19 2025 05/08/19 0013 05/08/19 0340 05/08/19 0758  GLUCAP 205* 196* 243* 191* 146*   Lipid Profile: No results for input(s): CHOL, HDL, LDLCALC, TRIG, CHOLHDL, LDLDIRECT in the last 72 hours. Thyroid Function Tests: No results for input(s): TSH, T4TOTAL, FREET4, T3FREE, THYROIDAB in the last 72 hours. Anemia Panel: No results for input(s): VITAMINB12, FOLATE, FERRITIN, TIBC, IRON, RETICCTPCT in the last 72 hours. Urine analysis:    Component Value Date/Time   COLORURINE YELLOW 04/25/2019 0826   APPEARANCEUR HAZY (A) 04/25/2019 0826   LABSPEC 1.015 04/25/2019 0826   PHURINE 5.0 04/25/2019 0826   GLUCOSEU 150 (A) 04/25/2019 0826   HGBUR LARGE (A) 04/25/2019 0826   BILIRUBINUR NEGATIVE 04/25/2019 0826   KETONESUR NEGATIVE 04/25/2019 0826   PROTEINUR 30 (A) 04/25/2019 0826   UROBILINOGEN 0.2 12/22/2011 2130   NITRITE NEGATIVE 04/25/2019 0826   LEUKOCYTESUR NEGATIVE 04/25/2019 0826   Sepsis Labs: Invalid input(s): PROCALCITONIN, LACTICIDVEN  Recent Results (from the past 240 hour(s))  Novel Coronavirus, NAA (hospital order; send-out to ref lab)     Status: None   Collection Time: 05/06/19 10:16 AM   Specimen: Nasopharyngeal Swab; Respiratory  Result Value Ref Range Status   SARS-CoV-2, NAA NOT DETECTED NOT DETECTED Final    Comment: (NOTE) This test was developed and its performance characteristics determined by [employer]. This test has not been FDA cleared or approved. This test has been authorized by FDA under an Emergency Use Authorization (EUA).  This test is only authorized for the duration of time the declaration that circumstances exist justifying the authorization of the emergency use of in vitro diagnostic tests for detection  of SARS-CoV-2 virus and/or diagnosis of COVID-19 infection under section 564(b)(1) of the Act, 21 U.S.C. [id number]), unless the authorization is terminated or revoked sooner. When diagnostic testing is negative, the possibility of a false negative result should be considered in the context of a patient's recent exposures and the presence of clinical signs and symptoms consistent with COVID-19. An individual without symptoms of COVID-19 and who is not shedding SARS-CoV-2 virus would expect to have a negative (not detected) result in this assay. Performed  At: [facility] [place], [place] [id number] Rush Farmer MD [id number]    [place]  Final    Comment: Performed at [facility] [address], [place], [place] [address]  SARS Coronavirus 2 [facility] order, Performed in [facility] hospital lab)     Status: Abnormal   Collection Time: 05/07/19  5:23 PM  Result Value Ref Range Status   SARS Coronavirus 2 POSITIVE (A) NEGATIVE Final    Comment: CRITICAL RESULT CALLED TO, READ BACK BY AND VERIFIED WITH: RN T SCEGEN AT 2143 05/07/19 CRUICKSHANK A (NOTE) If result is NEGATIVE SARS-CoV-2 target nucleic acids are NOT DETECTED. The SARS-CoV-2 RNA is generally detectable in upper and lower  respiratory specimens during the acute phase of infection. The lowest  concentration of SARS-CoV-2 viral copies this assay can detect is 250  copies / mL. A negative result does not preclude SARS-CoV-2 infection  and should not be used as the sole basis for treatment or other  patient management decisions.  A negative result may occur with  improper specimen collection / handling, submission of specimen other  than nasopharyngeal swab,  presence of viral mutation(s) within the  areas targeted by this assay, and inadequate number of viral copies  (<250 copies / mL). A negative result must be combined with clinical  observations, patient history, and epidemiological information. If result is POSITIVE SARS-CoV-2 target nucleic acids a re DETECTED. The SARS-CoV-2 RNA is generally detectable in upper and lower  respiratory specimens during the acute phase of infection.  Positive  results are indicative of active infection with SARS-CoV-2.  Clinical  correlation with patient history and other diagnostic information is  necessary to determine patient infection status.  Positive results do  not rule out bacterial infection or co-infection with other viruses. If result is PRESUMPTIVE POSTIVE SARS-CoV-2 nucleic acids MAY BE PRESENT.   A presumptive positive result was obtained on the submitted specimen  and confirmed on repeat testing.  While 2019 novel coronavirus  (SARS-CoV-2) nucleic acids may be present in the submitted sample  additional confirmatory testing may be necessary for epidemiological  and / or clinical management purposes  to differentiate between  SARS-CoV-2 and other Sarbecovirus currently known to infect humans.  If clinically indicated additional testing with an alternate test  methodolo gy [phone]) is advised. The SARS-CoV-2 RNA is generally  detectable in upper and lower respiratory specimens during the acute  phase of infection. The expected result is Negative. Fact Sheet for Patients:  StrictlyIdeas.no Fact Sheet for Healthcare Providers: BankingDealers.co.za This test is not yet approved or cleared by the [place] FDA and has been authorized for detection and/or diagnosis of SARS-CoV-2 by FDA under an Emergency Use Authorization (EUA).  This EUA will remain in effect (meaning this test can be used) for the duration of the COVID-19 declaration under  Section 564(b)(1) of the Act, 21 U.S.C. section 360bbb-3(b)(1), unless the authorization is terminated or revoked sooner. Performed at [facility], [place] [address], [place], [place] [address]      Radiology Studies: No results found.   Phillips Climes MD [facility]  Contact via  [email or web address]  [place] P: [phone] F: [phone]

## 2019-05-08 NOTE — Progress Notes (Addendum)
Physical Therapy Treatment Patient Details Name: Robert [name] MRN: [id number] DOB: Jun 07, 1951 Today's Date: 05/08/2019    History of Present Illness 68 yo male [name] HTN,/CAD,GERD, DM,RA, T3 compression fx, wrist fx, COPD,ischemic myopathy presented to the hospital and was admitted on 04/11/2019 with shortness of breath, he was found to be significantly hypoxic, tested positive for COVID-19, was intubated and transferred to [facility] for further management    PT Comments    The patient continues to progress in strength and functional mobility. Has aantigravity hip flexion and knee extension, able to lean forward while sitting in recliner. Pt on 3 L [place], SPO@ 94%. Continue PT for mobility and strengtheneing.   Follow Up Recommendations  CIR     Equipment Recommendations  (tba)    Recommendations for Other Services Rehab consult     Precautions / Restrictions Precautions Precautions: Fall Precaution Comments: scrotal edema; foley Other Brace: B Prevalon boots    Mobility  Bed Mobility Overal bed mobility: Needs Assistance Bed Mobility: Rolling Rolling: Mod assist;+2 for safety/equipment;+2 for physical assistance         General bed mobility comments: rolled multiple times for lift pad to be  placed  Transfers       Sit to Stand: +2 physical assistance;Total assist;+2 safety/equipment         General transfer comment: total lift to recliner  Ambulation/Gait                 Stairs             Wheelchair Mobility    Modified Rankin (Stroke Patients Only)       Balance Overall balance assessment: Needs assistance   Sitting balance-Leahy Scale: Fair Sitting balance - Comments: while oin recliner, pt. pulled self to sitting upright x 6, sat x 1 minute upright free of support except hands.                                    Cognition Arousal/Alertness: Awake/alert Behavior During Therapy: WFL for tasks assessed/performed Overall  Cognitive Status: Impaired/Different from baseline Area of Impairment: Attention;Awareness;Problem solving                   Current Attention Level: Sustained       Awareness: Emergent Problem Solving: Slow processing General Comments: much improved in MS, follows instructions for ther ex,      Exercises General Exercises - Upper Extremity Shoulder Flexion: AROM;AAROM;Both;10 reps;Seated;Theraband Shoulder Extension: AROM;AAROM;Both;10 reps;Supine Shoulder ABduction: AROM;AAROM;Both;10 reps;Supine Elbow Flexion: Strengthening;10 reps;Theraband;Seated Theraband Level (Elbow Flexion): Level 2 (Red) Elbow Extension: Both;10 reps;Seated;Theraband;Strengthening Theraband Level (Elbow Extension): Level 2 (Red) General Exercises - Lower Extremity Long Arc Quad: 20 reps;Both;Seated Hip Flexion/Marching: AROM;10 reps;Both;Seated    General Comments        Pertinent Vitals/Pain Pain Assessment: Faces Faces Pain Scale: Hurts a little bit Pain Location: general discomfort with mobility, scrotal pain sitting Pain Descriptors / Indicators: Discomfort;Grimacing Pain Intervention(s): Repositioned;Monitored during session    Home Living                      Prior Function            PT Goals (current goals can now be found in the care plan section) Acute Rehab PT Goals Patient Stated Goal: to use the bedpan Progress towards PT goals: Progressing toward goals    Frequency  Min 2X/week      PT Plan Current plan remains appropriate    Co-evaluation              AM-PAC PT "6 Clicks" Mobility   Outcome Measure  Help needed turning from your back to your side while in a flat bed without using bedrails?: A Lot Help needed moving from lying on your back to sitting on the side of a flat bed without using bedrails?: Total Help needed moving to and from a bed to a chair (including a wheelchair)?: Total Help needed standing up from a chair using your arms  (e.g., wheelchair or bedside chair)?: Total Help needed to walk in hospital room?: Total Help needed climbing 3-5 steps with a railing? : Total 6 Click Score: 7    End of Session Equipment Utilized During Treatment: Oxygen Activity Tolerance: Patient tolerated treatment well Patient left: in chair;with call bell/phone within reach;with chair alarm set;with nursing/sitter in room Nurse Communication: Mobility status;Need for lift equipment PT Visit Diagnosis: Muscle weakness (generalized) (M62.81)     Time: [id number] PT Time Calculation (min) (ACUTE ONLY): 47 min  Charges:  $Therapeutic Exercise: 23-37 mins $Therapeutic Activity: 8-22 mins                     Tresa Endo PT Acute Rehabilitation Services  Office [phone]    Claretha Cooper 05/08/2019, 1:57 PM

## 2019-05-09 LAB — COMPREHENSIVE METABOLIC PANEL
ALT: 53 U/L — ABNORMAL HIGH (ref 0–44)
AST: 26 U/L (ref 15–41)
Albumin: 2.3 g/dL — ABNORMAL LOW (ref 3.5–5.0)
Alkaline Phosphatase: 111 U/L (ref 38–126)
Anion gap: 9 (ref 5–15)
BUN: 36 mg/dL — ABNORMAL HIGH (ref 8–23)
CO2: 27 mmol/L (ref 22–32)
Calcium: 9 mg/dL (ref 8.9–10.3)
Chloride: 105 mmol/L (ref 98–111)
Creatinine, Ser: 1.38 mg/dL — ABNORMAL HIGH (ref 0.61–1.24)
GFR calc Af Amer: >60 mL/min (ref >60)
GFR calc non Af Amer: 53 mL/min — ABNORMAL LOW (ref >60)
Glucose, Bld: 184 mg/dL — ABNORMAL HIGH (ref 70–99)
Potassium: 4.1 mmol/L (ref 3.5–5.1)
Sodium: 141 mmol/L (ref 135–145)
Total Bilirubin: 0.2 mg/dL — ABNORMAL LOW (ref 0.3–1.2)
Total Protein: 6 g/dL — ABNORMAL LOW (ref 6.5–8.1)

## 2019-05-09 LAB — CBC
HCT: 27.9 % — ABNORMAL LOW (ref 39.0–52.0)
Hemoglobin: 8.9 g/dL — ABNORMAL LOW (ref 13.0–17.0)
MCH: 29.9 pg (ref 26.0–34.0)
MCHC: 31.9 g/dL (ref 30.0–36.0)
MCV: 93.6 fL (ref 80.0–100.0)
Platelets: 97 10*3/uL — ABNORMAL LOW (ref 150–400)
RBC: 2.98 MIL/uL — ABNORMAL LOW (ref 4.22–5.81)
RDW: 17.1 % — ABNORMAL HIGH (ref 11.5–15.5)
WBC: 4.8 10*3/uL (ref 4.0–10.5)
nRBC: 0 % (ref 0.0–0.2)

## 2019-05-09 LAB — GLUCOSE, CAPILLARY
Glucose-Capillary: 138 mg/dL — ABNORMAL HIGH (ref 70–99)
Glucose-Capillary: 158 mg/dL — ABNORMAL HIGH (ref 70–99)
Glucose-Capillary: 171 mg/dL — ABNORMAL HIGH (ref 70–99)
Glucose-Capillary: 173 mg/dL — ABNORMAL HIGH (ref 70–99)
Glucose-Capillary: 194 mg/dL — ABNORMAL HIGH (ref 70–99)
Glucose-Capillary: 222 mg/dL — ABNORMAL HIGH (ref 70–99)
Glucose-Capillary: 248 mg/dL — ABNORMAL HIGH (ref 70–99)

## 2019-05-09 NOTE — Progress Notes (Signed)
Inpatient Diabetes Program Recommendations  AACE/ADA: New Consensus Statement on Inpatient Glycemic Control (2015)  Target Ranges:  Prepandial:   less than 140 mg/dL      Peak postprandial:   less than 180 mg/dL (1-2 hours)      Critically ill patients:  140 - 180 mg/dL   Lab Results  Component Value Date   GLUCAP 138 (H) 05/09/2019   HGBA1C 7.4 (H) 04/11/2019    Review of Glycemic Control Results for Robert [name], Robert [name] (MRN [id number]) as of 05/09/2019 10:25  Ref. Range 05/08/2019 07:58 05/08/2019 11:22 05/08/2019 17:08 05/08/2019 20:43 05/08/2019 23:49  Glucose-Capillary Latest Ref Range: 70 - 99 mg/dL 146 (H)  Novolog 2 units 291 (H)  Novolog 8 units  Lantus 15 units 352 (H)  Novolog 15 units 248 (H)  Novolog 5 units 194 (H)  Novolog 3 units   Current orders for Inpatient glycemic control:  Lantus 15 units Daily Novolog 0-15 units Q4 hours  Inpatient Diabetes Program Recommendations:    Patient could benefit from Novolog 5 units tid meal coverage if patient consumes at least 50% of meals.  Thanks,  Tama Headings RN, MSN, BC-ADM Inpatient Diabetes Coordinator Team Pager [phone] (8a-5p)

## 2019-05-09 NOTE — Progress Notes (Signed)
Occupational Therapy Treatment Patient Details Name: Robert [name] MRN: [id number] DOB: Dec 22, 1950 Today's Date: 05/09/2019    History of present illness 68 yo male [place] HTN,/CAD,GERD, DM,RA, T3 compression fx, wrist fx, COPD,ischemic myopathy presented to the hospital and was admitted on 04/11/2019 with shortness of breath, he was found to be significantly hypoxic, tested positive for COVID-19, was intubated and transferred to [facility] for further management   OT comments  Continues to make steady progress. Maximove lifted to chair then completed ADL tasks, strengthening activities and exercises.Pt was able to lean forward to pull up both socks.  BUE/LE strength continues to improve. Apparent L RTC insufficiency  - pt reports he had problems with his shoulder PTA. Cognition impaired, but improving. Nursing states pt was trying to get OOB on his own upon her entry to room. Educated pt on importance of calling staff for assistance - pt verbalized understanding. Pt may benefit from tele safety sitter to reduce risk of falls. Will work toward sit - stand with use of Stedy next week. Pt issued level 1 theraband - asked nsg to encourage pt to complete on his own. VSS throughout session. Continue to recommend CIR for rehab.   Follow Up Recommendations  CIR;Supervision/Assistance - 24 hour    Equipment Recommendations  3 in 1 bedside commode    Recommendations for Other Services Rehab consult    Precautions / Restrictions Precautions Precautions: Fall Precaution Comments: scrotal edema; sacrak wound Required Braces or Orthoses: Other Brace(B prevalon boots) Restrictions Weight Bearing Restrictions: No       Mobility Bed Mobility                  Transfers Overall transfer level: Needs assistance               General transfer comment: working  on anterior weight shift with chair push ups in preparation for standing.    Balance     Sitting balance-Leahy Scale: Fair Sitting  balance - Comments: able to reposition self in recliner; S with anterior/posteiror weight shifts and lateral weight shifts                                   ADL either performed or assessed with clinical judgement   ADL Overall ADL's : Needs assistance/impaired Eating/Feeding: Supervision/ safety;Set up;Sitting(nectar thick liquids)   Grooming: Supervision/safety;Set up;Sitting               Lower Body Dressing: Maximal assistance Lower Body Dressing Details (indicate cue type and reason): in seated position, able to reach down to pull up sock       Toileting - Clothing Manipulation Details (indicate cue type and reason): condom cath placed. Pt excited about having foley removed             Vision       Perception     Praxis      Cognition Arousal/Alertness: Awake/alert Behavior During Therapy: WFL for tasks assessed/performed Overall Cognitive Status: Impaired/Different from baseline Area of Impairment: Attention;Awareness;Problem solving                   Current Attention Level: Selective       Awareness: Emergent Problem Solving: Slow processing General Comments: Nursing states pt had 2 legs off the bed and stated he was trying to get to the chair; pt states "my mind is off"  Exercises Exercises: Other exercises General Exercises - Upper Extremity Shoulder Flexion: Strengthening;Both;15 reps;Seated;Theraband Theraband Level (Shoulder Flexion): Level 1 (Yellow) Shoulder Extension: Strengthening;Both;15 reps;Seated;Theraband Theraband Level (Shoulder Extension): Level 1 (Yellow) Shoulder ABduction: Strengthening;Both;15 reps;Theraband Theraband Level (Shoulder Abduction): Level 1 (Yellow) Elbow Flexion: Strengthening;Both;15 reps;Seated;Theraband Theraband Level (Elbow Flexion): Level 1 (Yellow) Elbow Extension: Strengthening;Both;15 reps;Seated;Theraband Theraband Level (Elbow Extension): Level 1 (Yellow) Chair Push Up: 5  reps General Exercises - Lower Extremity Hip Flexion/Marching: 10 reps;Both;Seated Other Exercises Other Exercises: Shoulder PNF diagonals. Pt able to reach to pull top of sock; limitted on L due to RTC insufficiency   Shoulder Instructions       General Comments Pt given word search puzzle. TV turned off to increase concentration. Pt stated it was "nice to actually do something"    Pertinent Vitals/ Pain       Pain Assessment: Faces Faces Pain Scale: Hurts a little bit Pain Location: general discomfort with mobility, scrotal pain sitting Pain Descriptors / Indicators: Discomfort;Grimacing Pain Intervention(s): Limited activity within patient's tolerance;Repositioned  Home Living                                          Prior Functioning/Environment              Frequency  Min 3X/week        Progress Toward Goals  OT Goals(current goals can now be found in the care plan section)  Progress towards OT goals: Progressing toward goals  Acute Rehab OT Goals Patient Stated Goal: to get stronger OT Goal Formulation: With patient Time For Goal Achievement: 05/19/19 Potential to Achieve Goals: Good ADL Goals Pt Will Perform Eating: with set-up;with supervision;sitting;with adaptive utensils Pt Will Perform Grooming: with set-up;with supervision;sitting Pt Will Perform Upper Body Bathing: with set-up;with supervision;sitting Pt/caregiver will Perform Home Exercise Program: Increased strength;Both right and left upper extremity;With theraband;With written HEP provided;With minimal assist Additional ADL Goal #1: Pt will complete sit - stand with Mod A +2 in preparation for toilet transfers Additional ADL Goal #2: Pt will participate in 15 minute ADL task with SpO2 remaining above 90.  Plan Discharge plan remains appropriate    Co-evaluation                 AM-PAC OT "6 Clicks" Daily Activity     Outcome Measure   Help from another person eating  meals?: A Little Help from another person taking care of personal grooming?: A Little Help from another person toileting, which includes using toliet, bedpan, or urinal?: A Lot Help from another person bathing (including washing, rinsing, drying)?: A Lot Help from another person to put on and taking off regular upper body clothing?: A Lot Help from another person to put on and taking off regular lower body clothing?: A Lot 6 Click Score: 14    End of Session Equipment Utilized During Treatment: Oxygen  OT Visit Diagnosis: Other abnormalities of gait and mobility (R26.89);Muscle weakness (generalized) (M62.81);Other symptoms and signs involving cognitive function;Pain Pain - part of body: (buttocks)   Activity Tolerance Patient tolerated treatment well   Patient Left in chair;with call bell/phone within reach;with chair alarm set   Nurse Communication Mobility status;Need for lift equipment;Other (comment)(encourage theraband ex)        Time: 1600-1640 OT Time Calculation (min): 40 min  Charges: OT General Charges $OT Visit: 1 Visit OT Treatments $Self Care/Home Management :  8-22 mins $Therapeutic Activity: 8-22 mins $Neuromuscular Re-education: 8-22 mins  Maurie Boettcher, OT/L   Acute OT Clinical Specialist Acute Rehabilitation Services Pager [phone] Office [phone]    [facility] 05/09/2019, 5:05 PM

## 2019-05-09 NOTE — Progress Notes (Signed)
PROGRESS NOTE  Robert [name] [id number] DOB: 1950-12-23 DOA: 04/11/2019 PCP: Redmond School, MD   LOS: 27 days   Brief Narrative / Interim history:  68 year old male with history of COPD, CAD, diabetes mellitus, ischemic cardiomyopathy, RA, presented to the hospital and was admitted on 04/11/2019 with shortness of breath, he was found to be significantly hypoxic, tested positive for COVID-19, was intubated and transferred to [facility] for further management.  Hospital course complicated by persistent encephalopathy and difficulty weaning due to tachypnea and agitation, he failed extubation once on 8/2.  Hospital course was also complicated by MRSA pneumonia with septic shock at the beginning of August status post completed treatment. Patient eventually was eventually successfully extubated on 8/22 and transferred to the floor on 8/24  Significant events: 8/1 admission to [facility] ICU 8/2 extubated 8/3 early AM agitated, placed on BIPAP, required re-intubation 8/4 started on steroids for COPD exacerbation 8/8 septic shock requiring pressor support 8/9 off pressors, respiratory cultures +s.aureus 8/10 persistent encephalopathy off sedation, CT head ordered 8/11 encephalopathy improved 8/12 difficulty weaning due to tachypnea, ETT exchanged due to clot burden 8/13 PICC line inserted and subclavian central line removed 8/14 Antibiotics coverage broaden due to persistent fever and resp distress, pan cultured  8/19 more alert, tolerating pressure support 8/20 failed voiding trial, Foley needed to be reinserted 8/22 successfully extubated  8/24 transfer to the floor  Subjective: -Reported episode of coughing this morning when he was eating, otherwise denies fever, chest pain, shortness of breath .  Assessment & Plan: Principal Problem:   Pneumonia due to COVID-19 virus Active Problems:   Rheumatoid arthritis ([place])   Hyperlipidemia   COPD (chronic obstructive pulmonary disease) (HCC)  Controlled type 2 diabetes mellitus with diabetic peripheral angiopathy without gangrene, with long-term current use of insulin (HCC)   Essential hypertension   Ischemic cardiomyopathy   Coronary artery disease   Dyslipidemia (high LDL; low HDL)   Acute respiratory failure with hypoxemia (HCC)   COVID-19 virus detected   Acute respiratory distress syndrome (ARDS) due to COVID-19 virus   Atrial fibrillation with RVR (HCC)   COPD (chronic obstructive pulmonary disease) with emphysema (HCC)   AKI (acute kidney injury) ([place])   Diabetes mellitus type 2, uncontrolled, with complications (HCC)   Pressure injury of skin   Staphylococcal pneumonia ([place])   HCAP (healthcare-associated pneumonia)   Acute Hypoxic Respiratory Failure due to Covid-19 Viral Illness, COPD exacerbation -Extubated on 8/22, remains stable from respiratory standpoint, this morning he remains on 2 L nasal cannula, discussed with staff, they will try to wean down, patient reports he had history of smoking, 1.5 pack/day until admission, might be having some underlining COPD, and may still need some oxygen requirement at baseline. -Continue his inhalers for COPD, no longer wheezing  Fever: Temp (24hrs), Avg:98.3 F (36.8 C), Min:98 F (36.7 C), Max:98.6 F (37 C)  Antibiotics:  Cefepime 7/31 >8/4  Flagyl 7/31  Remdesivir 8/1 >8/5  Actemra 8/1  Decadron/solumedrol 8/1 >8/8  Zosyn 8/8>8/9 Azithro 8/7>8/9 Cefepime 8/14 >8/19 Vanc 7/31, 8/8>8/22  COVID-19 interventions: Remdesivir: 8/1 >> 8/5 Steroids: decadron / solumedrol 8/1 >> 8/5 Actemra: 8/1 Convalescent Plasma: none  Vitamin C and Zinc: yes   COVID-19 Labs  No results for input(s): DDIMER, FERRITIN, LDH, CRP in the last 72 hours.  Lab Results  Component Value Date   SARSCOV2NAA POSITIVE (A) 05/07/2019   SARSCOV2NAA NOT DETECTED 05/06/2019   SARSCOV2NAA POSITIVE (A) 04/11/2019   SARSCOV2NAA Detected (A) 04/11/2019  Active Problems MRSA HCAP  /septic shock -Off pressors now -Completed treatment with vancomycin for 3 weeks, last day was 8/22 -Respiratory status improving, extubated on 8/22 AM, currently stable on the floor  Paroxysmal A. fib with RVR -Currently in sinus, on metoprolol 25mg  3 times daily -He was on IV heparin but no anticoagulation is on hold due to bleeding with ETT clotting as well as bleeding from the subclavian line -No further evidence of A. fib on telemetry, his A. fib episode most likely related to his acute illness secondary to COVID-19 infection and respiratory failure .  So far remains in sinus rhythm, with no A. fib recurrence,   Will need anticoagulation if A. fib recurs.  Acute on chronic diastolic CHF, essential hypertension, history of CAD -History of PCI in 2018 with in-stent restenosis, -He denies any chest pain, continue with aspirin, Plavix. -6, creatinine bumped to 1.38 today, will hold Lasix for now as he appears to be euvolemic  Deconditioning/ICU induced muscle weakness -Aggressive PT/OT -On dysphagia 3 with thick liquids per speech, continue to be followed by SLP,  Hypernatremia -Resolved, sodium now normal  Acute kidney injury -Creatinine peaked as high as 2.39 on 04/24/2019, possibly multifactorial in the setting of diuresis and use of vancomycin -He was also found to have retention requiring Foley catheter placement. -At 1.38 today, will hold Lasix  Acute urinary obstruction -In the setting of ICU stay. Failed voiding trial on 8/20 and Foley had to be placed back.  Continue Flomax -We will attempt voiding trial today  ETT clotting/bleeding from the subclavian line -Patient was on therapeutic heparin, ET tube was clotted and exchange on 04/23/2019.  Currently on heparin prophylaxis -Hemoglobin stable  Thrombocytopenia -Chronic, stable  Type 2 diabetes mellitus, uncontrolled with hyperglycemia with vascular disease and neuropathy at baseline -CBGs fair, continue to monitor   CBG (last 3)  Recent Labs    05/09/19 0429 05/09/19 0744 05/09/19 1119  GLUCAP 158* 138* 171*   RA -He is on Actemra and methotrexate at home, currently on hold  Pressure ulcer of medial sacrum bilateral buttocks Pressure Injury 04/17/19 Sacrum Medial Deep Tissue Injury - Purple or maroon localized area of discolored intact skin or blood-filled blister due to damage of underlying soft tissue from pressure and/or shear. dark purple with some skin sloughing on lowe (Active)  04/17/19 0730  Location: Sacrum  Location Orientation: Medial  Staging: Deep Tissue Injury - Purple or maroon localized area of discolored intact skin or blood-filled blister due to damage of underlying soft tissue from pressure and/or shear.  Wound Description (Comments): dark purple with some skin sloughing on lower sacral area  Present on Admission: No     Pressure Injury 04/17/19 Buttocks Right;Lower Unstageable - Full thickness tissue loss in which the base of the ulcer is covered by slough (yellow, tan, gray, green or brown) and/or eschar (tan, brown or black) in the wound bed. (Active)  04/17/19 0730  Location: Buttocks  Location Orientation: Right;Lower  Staging: Unstageable - Full thickness tissue loss in which the base of the ulcer is covered by slough (yellow, tan, gray, green or brown) and/or eschar (tan, brown or black) in the wound bed.  Wound Description (Comments):   Present on Admission: No     Pressure Injury 04/17/19 Buttocks Left;Lower WOC updated to Stage 3 on 05/06/19 at the time of assessment (Active)  04/17/19 0730  Location: Buttocks  Location Orientation: Left;Lower  Staging:   Wound Description (Comments): WOC updated to Stage 3  on 05/06/19 at the time of assessment  Present on Admission: No     Scheduled Meds: . albuterol  2 puff Inhalation Q6H  . aspirin  81 mg Oral Daily  . bethanechol  10 mg Oral BID  . chlorhexidine  15 mL Mouth Rinse BID  . Chlorhexidine Gluconate Cloth  6  each Topical Daily  . clopidogrel  75 mg Oral Daily  . collagenase   Topical Daily  . enoxaparin (LOVENOX) injection  40 mg Subcutaneous Q24H  . feeding supplement (ENSURE ENLIVE)  237 mL Oral TID BM  . folic acid  1 mg Oral Daily  . furosemide  40 mg Oral Daily  . gabapentin  100 mg Oral Q8H  . insulin aspart  0-15 Units Subcutaneous Q4H  . insulin glargine  15 Units Subcutaneous Daily  . mouth rinse  15 mL Mouth Rinse q12n4p  . metoprolol tartrate  25 mg Oral TID  . mometasone-formoterol  2 puff Inhalation BID  . multivitamin with minerals  1 tablet Oral Daily  . sodium chloride flush  10-40 mL Intracatheter Q12H  . tamsulosin  0.8 mg Oral QPC supper   Continuous Infusions: . sodium chloride Stopped (05/05/19 1136)   PRN Meds:.sodium chloride, acetaminophen, dextrose, hydrALAZINE, Resource ThickenUp Clear, sodium chloride flush, Thrombi-Pad   DVT prophylaxis: Lovenox Code Status: Full code Family Communication: Discussed with patient, discussed with daughter via phone 8/27 Disposition Plan: Need placement, likely will benefit from CIR, but he is still testing positive for COVID-19  Consultants:   PCCM  Micro data: 7/31 blood >NGTD 7/31 sars-cov-2 >positive 8/1 Resp CX > Normal flora 8/7 Resp CX >MRSA 8/7 BCX > NGTD 8/8 UCX > NGTD 8/15 Resp culture>  MRSA    Objective: Vitals:   05/08/19 2355 05/09/19 0418 05/09/19 0745 05/09/19 1042  BP: 128/74 132/74 138/77 132/71  Pulse: 80 81 84 84  Resp: (!) 24 20 (!) 21 16  Temp:  98.2 F (36.8 C) 98 F (36.7 C)   TempSrc:  Oral Oral   SpO2: 97% 93% 95% 92%  Weight:      Height:        Intake/Output Summary (Last 24 hours) at 05/09/2019 1205 Last data filed at 05/09/2019 1042 Gross per 24 hour  Intake 370 ml  Output 2125 ml  Net -1755 ml   Filed Weights   05/05/19 0300 05/06/19 0500 05/07/19 0235  Weight: 104.2 kg 111 kg 106.1 kg    Examination:  Awake Alert, Oriented X 3, No new F.N deficits, Normal  affect Symmetrical Chest wall movement, Good air movement bilaterally, CTAB RRR,No Gallops,Rubs or new Murmurs, No Parasternal Heave +ve B.Sounds, Abd Soft, No tenderness, No rebound - guarding or rigidity. No Cyanosis, Clubbing or edema, No new Rash or bruise     Data Reviewed: I have independently reviewed following labs and imaging studies    CBC: Recent Labs  Lab 07-May-2019 0435 05/04/19 0515  05/05/19 0455 05/06/19 0640 05/07/19 0130 05/08/19 1208 05/09/19 0058  WBC 7.1 7.1  --  6.6 5.6 5.6 5.2 4.8  NEUTROABS 5.7 5.7  --  5.1  --   --   --   --   HGB 8.8* 9.0*   < > 8.8* 10.4* 8.7* 8.9* 8.9*  HCT 28.7* 29.1*   < > 28.0* 33.0* 27.2* 27.7* 27.9*  MCV 97.3 97.0  --  96.2 94.6 94.8 94.2 93.6  PLT 102* 99*  --  92* 90* 93* 96* 97*   < > =  values in this interval not displayed.   Basic Metabolic Panel: Recent Labs  Lab 05/03/19 0435 05/04/19 0515 05/04/19 0941 05/05/19 0455 05/06/19 0640 05/07/19 0130 05/09/19 0058  NA 150* 151* 151* 149* 142 141 141  K 4.4 4.1 3.9 4.1 3.9 4.0 4.1  CL 118* 118*  --  115* 107 106 105  CO2 25 28  --  27 26 27 27   GLUCOSE 108* 148*  --  222* 176* 175* 184*  BUN 65* 66*  --  68* 55* 51* 36*  CREATININE 1.28* 1.26*  --  1.22 1.16 1.25* 1.38*  CALCIUM 8.7* 8.7*  --  8.7* 8.6* 8.7* 9.0  MG 2.2 2.1  --  2.1  --   --   --    GFR: Estimated Creatinine Clearance: 66.3 mL/min (A) (by C-G formula based on SCr of 1.38 mg/dL (H)). Liver Function Tests: Recent Labs  Lab 05/03/19 0435 05/04/19 0515 05/05/19 0455 05/07/19 0130 05/09/19 0058  AST 47* 41 29 31 26   ALT 79* 72* 62* 57* 53*  ALKPHOS 92 90 94 98 111  BILITOT 0.4 0.4 0.3 0.6 0.2*  PROT 5.7* 5.8* 5.7* 5.9* 6.0*  ALBUMIN 2.1* 2.2* 2.2* 2.3* 2.3*   No results for input(s): LIPASE, AMYLASE in the last 168 hours. Recent Labs  Lab 05/04/19 1043  AMMONIA 22   Coagulation Profile: No results for input(s): INR, PROTIME in the last 168 hours. Cardiac Enzymes: No results for  input(s): CKTOTAL, CKMB, CKMBINDEX, TROPONINI in the last 168 hours. BNP (last 3 results) No results for input(s): PROBNP in the last 8760 hours. HbA1C: No results for input(s): HGBA1C in the last 72 hours. CBG: Recent Labs  Lab 05/08/19 2043 05/08/19 2349 05/09/19 0429 05/09/19 0744 05/09/19 1119  GLUCAP 248* 194* 158* 138* 171*   Lipid Profile: No results for input(s): CHOL, HDL, LDLCALC, TRIG, CHOLHDL, LDLDIRECT in the last 72 hours. Thyroid Function Tests: No results for input(s): TSH, T4TOTAL, FREET4, T3FREE, THYROIDAB in the last 72 hours. Anemia Panel: No results for input(s): VITAMINB12, FOLATE, FERRITIN, TIBC, IRON, RETICCTPCT in the last 72 hours. Urine analysis:    Component Value Date/Time   COLORURINE YELLOW 04/25/2019 0826   APPEARANCEUR HAZY (A) 04/25/2019 0826   LABSPEC 1.015 04/25/2019 0826   PHURINE 5.0 04/25/2019 0826   GLUCOSEU 150 (A) 04/25/2019 0826   HGBUR LARGE (A) 04/25/2019 0826   BILIRUBINUR NEGATIVE 04/25/2019 0826   KETONESUR NEGATIVE 04/25/2019 0826   PROTEINUR 30 (A) 04/25/2019 0826   UROBILINOGEN 0.2 12/22/2011 2130   NITRITE NEGATIVE 04/25/2019 0826   LEUKOCYTESUR NEGATIVE 04/25/2019 0826   Sepsis Labs: Invalid input(s): PROCALCITONIN, LACTICIDVEN  Recent Results (from the past 240 hour(s))  Novel Coronavirus, NAA (hospital order; send-out to ref lab)     Status: None   Collection Time: 05/06/19 10:16 AM   Specimen: Nasopharyngeal Swab; Respiratory  Result Value Ref Range Status   SARS-CoV-2, NAA NOT DETECTED NOT DETECTED Final    Comment: (NOTE) This test was developed and its performance characteristics determined by [employer]. This test has not been FDA cleared or approved. This test has been authorized by FDA under an Emergency Use Authorization (EUA). This test is only authorized for the duration of time the declaration that circumstances exist justifying the authorization of the emergency use of in vitro diagnostic  tests for detection of SARS-CoV-2 virus and/or diagnosis of COVID-19 infection under section 564(b)(1) of the Act, 21 U.S.C. [id number]), unless the authorization is terminated or revoked sooner. When  diagnostic testing is negative, the possibility of a false negative result should be considered in the context of a patient's recent exposures and the presence of clinical signs and symptoms consistent with COVID-19. An individual without symptoms of COVID-19 and who is not shedding SARS-CoV-2 virus would expect to have a negative (not detected) result in this assay. Performed  At: [facility] [place], [place] [id number] Rush Farmer MD [id number]    [place]  Final    Comment: Performed at [facility] [address], [place], [place] [address]  SARS Coronavirus 2 [facility] order, Performed in [facility] hospital lab)     Status: Abnormal   Collection Time: 05/07/19  5:23 PM  Result Value Ref Range Status   SARS Coronavirus 2 POSITIVE (A) NEGATIVE Final    Comment: CRITICAL RESULT CALLED TO, READ BACK BY AND VERIFIED WITH: RN T SCEGEN AT 2143 05/07/19 CRUICKSHANK A (NOTE) If result is NEGATIVE SARS-CoV-2 target nucleic acids are NOT DETECTED. The SARS-CoV-2 RNA is generally detectable in upper and lower  respiratory specimens during the acute phase of infection. The lowest  concentration of SARS-CoV-2 viral copies this assay can detect is 250  copies / mL. A negative result does not preclude SARS-CoV-2 infection  and should not be used as the sole basis for treatment or other  patient management decisions.  A negative result may occur with  improper specimen collection / handling, submission of specimen other  than nasopharyngeal swab, presence of viral mutation(s) within the  areas targeted by this assay, and inadequate number of viral copies  (<250 copies / mL). A negative result must be combined with  clinical  observations, patient history, and epidemiological information. If result is POSITIVE SARS-CoV-2 target nucleic acids a re DETECTED. The SARS-CoV-2 RNA is generally detectable in upper and lower  respiratory specimens during the acute phase of infection.  Positive  results are indicative of active infection with SARS-CoV-2.  Clinical  correlation with patient history and other diagnostic information is  necessary to determine patient infection status.  Positive results do  not rule out bacterial infection or co-infection with other viruses. If result is PRESUMPTIVE POSTIVE SARS-CoV-2 nucleic acids MAY BE PRESENT.   A presumptive positive result was obtained on the submitted specimen  and confirmed on repeat testing.  While 2019 novel coronavirus  (SARS-CoV-2) nucleic acids may be present in the submitted sample  additional confirmatory testing may be necessary for epidemiological  and / or clinical management purposes  to differentiate between  SARS-CoV-2 and other Sarbecovirus currently known to infect humans.  If clinically indicated additional testing with an alternate test  methodolo gy [phone]) is advised. The SARS-CoV-2 RNA is generally  detectable in upper and lower respiratory specimens during the acute  phase of infection. The expected result is Negative. Fact Sheet for Patients:  StrictlyIdeas.no Fact Sheet for Healthcare Providers: BankingDealers.co.za This test is not yet approved or cleared by the [place] FDA and has been authorized for detection and/or diagnosis of SARS-CoV-2 by FDA under an Emergency Use Authorization (EUA).  This EUA will remain in effect (meaning this test can be used) for the duration of the COVID-19 declaration under Section 564(b)(1) of the Act, 21 U.S.C. section 360bbb-3(b)(1), unless the authorization is terminated or revoked sooner. Performed at [facility],  [place] [address] [place], [place] [address]      Radiology Studies: No results found.   Phillips Climes MD [facility]  Contact via  [email or web address]  [place] P: [phone] F: [phone]

## 2019-05-10 DIAGNOSIS — R338 Other retention of urine: Secondary | ICD-10-CM

## 2019-05-10 LAB — CBC
HCT: 27.2 % — ABNORMAL LOW (ref 39.0–52.0)
Hemoglobin: 8.6 g/dL — ABNORMAL LOW (ref 13.0–17.0)
MCH: 29.9 pg (ref 26.0–34.0)
MCHC: 31.6 g/dL (ref 30.0–36.0)
MCV: 94.4 fL (ref 80.0–100.0)
Platelets: 103 10*3/uL — ABNORMAL LOW (ref 150–400)
RBC: 2.88 MIL/uL — ABNORMAL LOW (ref 4.22–5.81)
RDW: 17.2 % — ABNORMAL HIGH (ref 11.5–15.5)
WBC: 6.3 10*3/uL (ref 4.0–10.5)
nRBC: 0 % (ref 0.0–0.2)

## 2019-05-10 LAB — COMPREHENSIVE METABOLIC PANEL
ALT: 52 U/L — ABNORMAL HIGH (ref 0–44)
AST: 28 U/L (ref 15–41)
Albumin: 2.2 g/dL — ABNORMAL LOW (ref 3.5–5.0)
Alkaline Phosphatase: 112 U/L (ref 38–126)
Anion gap: 8 (ref 5–15)
BUN: 31 mg/dL — ABNORMAL HIGH (ref 8–23)
CO2: 30 mmol/L (ref 22–32)
Calcium: 8.9 mg/dL (ref 8.9–10.3)
Chloride: 103 mmol/L (ref 98–111)
Creatinine, Ser: 1.31 mg/dL — ABNORMAL HIGH (ref 0.61–1.24)
GFR calc Af Amer: >60 mL/min (ref >60)
GFR calc non Af Amer: 56 mL/min — ABNORMAL LOW (ref >60)
Glucose, Bld: 127 mg/dL — ABNORMAL HIGH (ref 70–99)
Potassium: 4.1 mmol/L (ref 3.5–5.1)
Sodium: 141 mmol/L (ref 135–145)
Total Bilirubin: 0.4 mg/dL (ref 0.3–1.2)
Total Protein: 6 g/dL — ABNORMAL LOW (ref 6.5–8.1)

## 2019-05-10 LAB — GLUCOSE, CAPILLARY
Glucose-Capillary: 114 mg/dL — ABNORMAL HIGH (ref 70–99)
Glucose-Capillary: 220 mg/dL — ABNORMAL HIGH (ref 70–99)
Glucose-Capillary: 230 mg/dL — ABNORMAL HIGH (ref 70–99)
Glucose-Capillary: 265 mg/dL — ABNORMAL HIGH (ref 70–99)
Glucose-Capillary: 313 mg/dL — ABNORMAL HIGH (ref 70–99)

## 2019-05-10 MED ORDER — SENNOSIDES-DOCUSATE SODIUM 8.6-50 MG PO TABS
3.0000 | ORAL_TABLET | Freq: Two times a day (BID) | ORAL | Status: DC
  Administered 2019-05-10 – 2019-05-21 (×8): 3 via ORAL
  Filled 2019-05-10 (×15): qty 3

## 2019-05-10 MED ORDER — BISACODYL 10 MG RE SUPP
10.0000 mg | Freq: Once | RECTAL | Status: DC
  Filled 2019-05-10: qty 1

## 2019-05-10 NOTE — Progress Notes (Signed)
PROGRESS NOTE  Robert [name] [id number] DOB: 1951/07/02 DOA: 04/11/2019 PCP: Redmond School, MD   LOS: 28 days   Brief Narrative / Interim history:  68 year old male with history of COPD, CAD, diabetes mellitus, ischemic cardiomyopathy, RA, presented to the hospital and was admitted on 04/11/2019 with shortness of breath, he was found to be significantly hypoxic, tested positive for COVID-19, was intubated and transferred to [facility] for further management.  Hospital course complicated by persistent encephalopathy and difficulty weaning due to tachypnea and agitation, he failed extubation once on 8/2.  Hospital course was also complicated by MRSA pneumonia with septic shock at the beginning of August status post completed treatment. Patient eventually was eventually successfully extubated on 8/22 and transferred to the floor on 8/24  Significant events: 8/1 admission to [facility] ICU 8/2 extubated 8/3 early AM agitated, placed on BIPAP, required re-intubation 8/4 started on steroids for COPD exacerbation 8/8 septic shock requiring pressor support 8/9 off pressors, respiratory cultures +s.aureus 8/10 persistent encephalopathy off sedation, CT head ordered 8/11 encephalopathy improved 8/12 difficulty weaning due to tachypnea, ETT exchanged due to clot burden 8/13 PICC line inserted and subclavian central line removed 8/14 Antibiotics coverage broaden due to persistent fever and resp distress, pan cultured  8/19 more alert, tolerating pressure support 8/20 failed voiding trial, Foley needed to be reinserted 8/22 successfully extubated  8/24 transfer to the floor  Subjective: -He had urinary retention overnight, where he required foley reinsertion.  Assessment & Plan: Principal Problem:   Pneumonia due to COVID-19 virus Active Problems:   Rheumatoid arthritis ([place])   Hyperlipidemia   COPD (chronic obstructive pulmonary disease) (HCC)   Controlled type 2 diabetes mellitus with diabetic  peripheral angiopathy without gangrene, with long-term current use of insulin (HCC)   Essential hypertension   Ischemic cardiomyopathy   Coronary artery disease   Dyslipidemia (high LDL; low HDL)   Acute respiratory failure with hypoxemia (HCC)   COVID-19 virus detected   Acute respiratory distress syndrome (ARDS) due to COVID-19 virus   Atrial fibrillation with RVR (HCC)   COPD (chronic obstructive pulmonary disease) with emphysema (HCC)   AKI (acute kidney injury) ([place])   Diabetes mellitus type 2, uncontrolled, with complications (HCC)   Pressure injury of skin   Staphylococcal pneumonia ([place])   HCAP (healthcare-associated pneumonia)   Acute Hypoxic Respiratory Failure due to Covid-19 Viral Illness, COPD exacerbation -Extubated on 8/22, remains stable from respiratory standpoint, this morning he remains on 2 L nasal cannula, discussed with staff, they will try to wean down, patient reports he had history of smoking, 1.5 pack/day until admission, might be having some underlining COPD, and may still need some oxygen requirement at baseline. -Continue his inhalers for COPD, no longer wheezing  Fever: Temp (24hrs), Avg:98.1 F (36.7 C), Min:97.7 F (36.5 C), Max:98.4 F (36.9 C)  Antibiotics:  Cefepime 7/31 >8/4  Flagyl 7/31  Remdesivir 8/1 >8/5  Actemra 8/1  Decadron/solumedrol 8/1 >8/8  Zosyn 8/8>8/9 Azithro 8/7>8/9 Cefepime 8/14 >8/19 Vanc 7/31, 8/8>8/22  COVID-19 interventions: Remdesivir: 8/1 >> 8/5 Steroids: decadron / solumedrol 8/1 >> 8/5 Actemra: 8/1 Convalescent Plasma: none  Vitamin C and Zinc: yes   COVID-19 Labs  No results for input(s): DDIMER, FERRITIN, LDH, CRP in the last 72 hours.  Lab Results  Component Value Date   Haena (A) 05/07/2019   SARSCOV2NAA NOT DETECTED 05/06/2019   SARSCOV2NAA POSITIVE (A) 04/11/2019   SARSCOV2NAA Detected (A) 04/11/2019    Active Problems MRSA HCAP /septic shock -Off  pressors now -Completed  treatment with vancomycin for 3 weeks, last day was 8/22 -Respiratory status improving, extubated on 8/22 AM, currently stable on the floor  Paroxysmal A. fib with RVR -Currently in sinus, on metoprolol 25mg  3 times daily -He was on IV heparin but no anticoagulation is on hold due to bleeding with ETT clotting as well as bleeding from the subclavian line -No further evidence of A. fib on telemetry, his A. fib episode most likely related to his acute illness secondary to COVID-19 infection and respiratory failure .  So far remains in sinus rhythm, with no A. fib recurrence,   Will need anticoagulation if A. fib recurs.  Acute on chronic diastolic CHF, essential hypertension, history of CAD -History of PCI in 2018 with in-stent restenosis, -He denies any chest pain, continue with aspirin, Plavix. -Creatinine 1.3 today, will continue to hold lasix, he appears to be euvolemic  Deconditioning/ICU induced muscle weakness -Aggressive PT/OT -On dysphagia 3 with thick liquids per speech, continue to be followed by SLP,  Hypernatremia -Resolved, sodium now normal  Acute kidney injury -Creatinine peaked as high as 2.39 on 04/24/2019, possibly multifactorial in the setting of diuresis and use of vancomycin -He was also found to have retention requiring Foley catheter placement. -At 1.3 today, will hold Lasix  Acute urinary retention. -In the setting of ICU stay. Failed voiding trial on 8/20 and Foley had to be placed back.  He failed another voiding attempt yesterday, Foley catheter inserted earlier today, continue with Flomax.  ETT clotting/bleeding from the subclavian line -Patient was on therapeutic heparin, ET tube was clotted and exchange on 04/23/2019.  Currently on heparin prophylaxis -Hemoglobin stable  Thrombocytopenia -Chronic, stable  Type 2 diabetes mellitus, uncontrolled with hyperglycemia with vascular disease and neuropathy at baseline -CBGs fair, continue to monitor  CBG (last  3)  Recent Labs    05/09/19 2359 05/10/19 0728 05/10/19 1147  GLUCAP 230* 114* 265*   RA -He is on Actemra and methotrexate at home, currently on hold  Pressure ulcer of medial sacrum bilateral buttocks Pressure Injury 04/17/19 Sacrum Medial Deep Tissue Injury - Purple or maroon localized area of discolored intact skin or blood-filled blister due to damage of underlying soft tissue from pressure and/or shear. dark purple with some skin sloughing on lowe (Active)  04/17/19 0730  Location: Sacrum  Location Orientation: Medial  Staging: Deep Tissue Injury - Purple or maroon localized area of discolored intact skin or blood-filled blister due to damage of underlying soft tissue from pressure and/or shear.  Wound Description (Comments): dark purple with some skin sloughing on lower sacral area  Present on Admission: No     Pressure Injury 04/17/19 Buttocks Right;Lower Unstageable - Full thickness tissue loss in which the base of the ulcer is covered by slough (yellow, tan, gray, green or brown) and/or eschar (tan, brown or black) in the wound bed. (Active)  04/17/19 0730  Location: Buttocks  Location Orientation: Right;Lower  Staging: Unstageable - Full thickness tissue loss in which the base of the ulcer is covered by slough (yellow, tan, gray, green or brown) and/or eschar (tan, brown or black) in the wound bed.  Wound Description (Comments):   Present on Admission: No     Pressure Injury 04/17/19 Buttocks Left;Lower WOC updated to Stage 3 on 05/06/19 at the time of assessment (Active)  04/17/19 0730  Location: Buttocks  Location Orientation: Left;Lower  Staging:   Wound Description (Comments): WOC updated to Stage 3 on 05/06/19 at the time  of assessment  Present on Admission: No     Scheduled Meds: . albuterol  2 puff Inhalation Q6H  . aspirin  81 mg Oral Daily  . bethanechol  10 mg Oral BID  . chlorhexidine  15 mL Mouth Rinse BID  . Chlorhexidine Gluconate Cloth  6 each  Topical Daily  . clopidogrel  75 mg Oral Daily  . collagenase   Topical Daily  . enoxaparin (LOVENOX) injection  40 mg Subcutaneous Q24H  . feeding supplement (ENSURE ENLIVE)  237 mL Oral TID BM  . folic acid  1 mg Oral Daily  . gabapentin  100 mg Oral Q8H  . insulin aspart  0-15 Units Subcutaneous Q4H  . insulin glargine  15 Units Subcutaneous Daily  . mouth rinse  15 mL Mouth Rinse q12n4p  . metoprolol tartrate  25 mg Oral TID  . mometasone-formoterol  2 puff Inhalation BID  . multivitamin with minerals  1 tablet Oral Daily  . sodium chloride flush  10-40 mL Intracatheter Q12H  . tamsulosin  0.8 mg Oral QPC supper   Continuous Infusions: . sodium chloride Stopped (05/05/19 1136)   PRN Meds:.sodium chloride, acetaminophen, dextrose, hydrALAZINE, Resource ThickenUp Clear, sodium chloride flush, Thrombi-Pad   DVT prophylaxis: Lovenox Code Status: Full code Family Communication: Discussed with patient, discussed with daughter via phone 8/27 Disposition Plan: Need placement, likely will benefit from CIR, but he is still testing positive for COVID-19  Consultants:   PCCM  Micro data: 7/31 blood >NGTD 7/31 sars-cov-2 >positive 8/1 Resp CX > Normal flora 8/7 Resp CX >MRSA 8/7 BCX > NGTD 8/8 UCX > NGTD 8/15 Resp culture>  MRSA    Objective: Vitals:   05/10/19 0738 05/10/19 0842 05/10/19 1053 05/10/19 1215  BP: 126/69   122/69  Pulse: 79  88 86  Resp: (!) 23   (!) 22  Temp: 98.2 F (36.8 C)   98.4 F (36.9 C)  TempSrc: Oral   Oral  SpO2: 92% (!) 88% 95% 95%  Weight:      Height:        Intake/Output Summary (Last 24 hours) at 05/10/2019 1434 Last data filed at 05/10/2019 1215 Gross per 24 hour  Intake 960 ml  Output 2875 ml  Net -1915 ml   Filed Weights   05/05/19 0300 05/06/19 0500 05/07/19 0235  Weight: 104.2 kg 111 kg 106.1 kg    Examination:  Awake Alert, Oriented X 3, No new F.N deficits, Normal affect Symmetrical Chest wall movement, Good air  movement bilaterally, CTAB RRR,No Gallops,Rubs or new Murmurs, No Parasternal Heave +ve B.Sounds, Abd Soft, No tenderness, No rebound - guarding or rigidity. No Cyanosis, Clubbing or edema, No new Rash or bruise     Data Reviewed: I have independently reviewed following labs and imaging studies    CBC: Recent Labs  Lab 30-May-2019 0515  05/05/19 0455 05/06/19 0640 05/07/19 0130 05/08/19 1208 05/09/19 0058 05/10/19 0650  WBC 7.1  --  6.6 5.6 5.6 5.2 4.8 6.3  NEUTROABS 5.7  --  5.1  --   --   --   --   --   HGB 9.0*   < > 8.8* 10.4* 8.7* 8.9* 8.9* 8.6*  HCT 29.1*   < > 28.0* 33.0* 27.2* 27.7* 27.9* 27.2*  MCV 97.0  --  96.2 94.6 94.8 94.2 93.6 94.4  PLT 99*  --  92* 90* 93* 96* 97* 103*   < > = values in this interval not displayed.   Basic  Metabolic Panel: Recent Labs  Lab 05/04/19 0515  05/05/19 0455 05/06/19 0640 05/07/19 0130 05/09/19 0058 05/10/19 0650  NA 151*   < > 149* 142 141 141 141  K 4.1   < > 4.1 3.9 4.0 4.1 4.1  CL 118*  --  115* 107 106 105 103  CO2 28  --  27 26 27 27 30   GLUCOSE 148*  --  222* 176* 175* 184* 127*  BUN 66*  --  68* 55* 51* 36* 31*  CREATININE 1.26*  --  1.22 1.16 1.25* 1.38* 1.31*  CALCIUM 8.7*  --  8.7* 8.6* 8.7* 9.0 8.9  MG 2.1  --  2.1  --   --   --   --    < > = values in this interval not displayed.   GFR: Estimated Creatinine Clearance: 69.8 mL/min (A) (by C-G formula based on SCr of 1.31 mg/dL (H)). Liver Function Tests: Recent Labs  Lab 05/04/19 0515 05/05/19 0455 05/07/19 0130 05/09/19 0058 05/10/19 0650  AST 41 29 31 26 28   ALT 72* 62* 57* 53* 52*  ALKPHOS 90 94 98 111 112  BILITOT 0.4 0.3 0.6 0.2* 0.4  PROT 5.8* 5.7* 5.9* 6.0* 6.0*  ALBUMIN 2.2* 2.2* 2.3* 2.3* 2.2*   No results for input(s): LIPASE, AMYLASE in the last 168 hours. Recent Labs  Lab 05/04/19 1043  AMMONIA 22   Coagulation Profile: No results for input(s): INR, PROTIME in the last 168 hours. Cardiac Enzymes: No results for input(s): CKTOTAL,  CKMB, CKMBINDEX, TROPONINI in the last 168 hours. BNP (last 3 results) No results for input(s): PROBNP in the last 8760 hours. HbA1C: No results for input(s): HGBA1C in the last 72 hours. CBG: Recent Labs  Lab 05/09/19 1609 05/09/19 2327 05/09/19 2359 05/10/19 0728 05/10/19 1147  GLUCAP 222* 173* 230* 114* 265*   Lipid Profile: No results for input(s): CHOL, HDL, LDLCALC, TRIG, CHOLHDL, LDLDIRECT in the last 72 hours. Thyroid Function Tests: No results for input(s): TSH, T4TOTAL, FREET4, T3FREE, THYROIDAB in the last 72 hours. Anemia Panel: No results for input(s): VITAMINB12, FOLATE, FERRITIN, TIBC, IRON, RETICCTPCT in the last 72 hours. Urine analysis:    Component Value Date/Time   COLORURINE YELLOW 04/25/2019 0826   APPEARANCEUR HAZY (A) 04/25/2019 0826   LABSPEC 1.015 04/25/2019 0826   PHURINE 5.0 04/25/2019 0826   GLUCOSEU 150 (A) 04/25/2019 0826   HGBUR LARGE (A) 04/25/2019 0826   BILIRUBINUR NEGATIVE 04/25/2019 0826   KETONESUR NEGATIVE 04/25/2019 0826   PROTEINUR 30 (A) 04/25/2019 0826   UROBILINOGEN 0.2 12/22/2011 2130   NITRITE NEGATIVE 04/25/2019 0826   LEUKOCYTESUR NEGATIVE 04/25/2019 0826   Sepsis Labs: Invalid input(s): PROCALCITONIN, LACTICIDVEN  Recent Results (from the past 240 hour(s))  Novel Coronavirus, NAA (hospital order; send-out to ref lab)     Status: None   Collection Time: 05/06/19 10:16 AM   Specimen: Nasopharyngeal Swab; Respiratory  Result Value Ref Range Status   SARS-CoV-2, NAA NOT DETECTED NOT DETECTED Final    Comment: (NOTE) This test was developed and its performance characteristics determined by [employer]. This test has not been FDA cleared or approved. This test has been authorized by FDA under an Emergency Use Authorization (EUA). This test is only authorized for the duration of time the declaration that circumstances exist justifying the authorization of the emergency use of in vitro diagnostic tests for  detection of SARS-CoV-2 virus and/or diagnosis of COVID-19 infection under section 564(b)(1) of the Act, 21 U.S.C. [id number]),  unless the authorization is terminated or revoked sooner. When diagnostic testing is negative, the possibility of a false negative result should be considered in the context of a patient's recent exposures and the presence of clinical signs and symptoms consistent with COVID-19. An individual without symptoms of COVID-19 and who is not shedding SARS-CoV-2 virus would expect to have a negative (not detected) result in this assay. Performed  At: [facility] [place], [place] [id number] Rush Farmer MD [id number]    [place]  Final    Comment: Performed at [facility] [address] [place], [place] [address]  SARS Coronavirus 2 [facility] order, Performed in [facility] hospital lab)     Status: Abnormal   Collection Time: 05/07/19  5:23 PM  Result Value Ref Range Status   SARS Coronavirus 2 POSITIVE (A) NEGATIVE Final    Comment: CRITICAL RESULT CALLED TO, READ BACK BY AND VERIFIED WITH: RN T SCEGEN AT 2143 05/07/19 CRUICKSHANK A (NOTE) If result is NEGATIVE SARS-CoV-2 target nucleic acids are NOT DETECTED. The SARS-CoV-2 RNA is generally detectable in upper and lower  respiratory specimens during the acute phase of infection. The lowest  concentration of SARS-CoV-2 viral copies this assay can detect is 250  copies / mL. A negative result does not preclude SARS-CoV-2 infection  and should not be used as the sole basis for treatment or other  patient management decisions.  A negative result may occur with  improper specimen collection / handling, submission of specimen other  than nasopharyngeal swab, presence of viral mutation(s) within the  areas targeted by this assay, and inadequate number of viral copies  (<250 copies / mL). A negative result must be combined with clinical   observations, patient history, and epidemiological information. If result is POSITIVE SARS-CoV-2 target nucleic acids a re DETECTED. The SARS-CoV-2 RNA is generally detectable in upper and lower  respiratory specimens during the acute phase of infection.  Positive  results are indicative of active infection with SARS-CoV-2.  Clinical  correlation with patient history and other diagnostic information is  necessary to determine patient infection status.  Positive results do  not rule out bacterial infection or co-infection with other viruses. If result is PRESUMPTIVE POSTIVE SARS-CoV-2 nucleic acids MAY BE PRESENT.   A presumptive positive result was obtained on the submitted specimen  and confirmed on repeat testing.  While 2019 novel coronavirus  (SARS-CoV-2) nucleic acids may be present in the submitted sample  additional confirmatory testing may be necessary for epidemiological  and / or clinical management purposes  to differentiate between  SARS-CoV-2 and other Sarbecovirus currently known to infect humans.  If clinically indicated additional testing with an alternate test  methodolo gy [phone]) is advised. The SARS-CoV-2 RNA is generally  detectable in upper and lower respiratory specimens during the acute  phase of infection. The expected result is Negative. Fact Sheet for Patients:  StrictlyIdeas.no Fact Sheet for Healthcare Providers: BankingDealers.co.za This test is not yet approved or cleared by the [place] FDA and has been authorized for detection and/or diagnosis of SARS-CoV-2 by FDA under an Emergency Use Authorization (EUA).  This EUA will remain in effect (meaning this test can be used) for the duration of the COVID-19 declaration under Section 564(b)(1) of the Act, 21 U.S.C. section 360bbb-3(b)(1), unless the authorization is terminated or revoked sooner. Performed at [facility], [place]  [address], [place], [place] [address]      Radiology Studies: No  results found.   Phillips Climes MD [facility]  Contact via  [email or web address]  [place] P: [phone] F: [phone]

## 2019-05-10 NOTE — Progress Notes (Signed)
Bladder scan revels only 240ml in bladder encouraged to drink more.

## 2019-05-10 NOTE — Progress Notes (Signed)
Responding to a call from pts daughter- apparently he couldn't find call light lying next to him- He reports feeling full in his bladder- the nurse responding to the call performed another scan- depending on the setting she obtained ranges from 0 to >1000- obtained order to replace foley-placed 52fr indwelling cath- no complications got immediate return 2066ml yellow urine- clamped it off at that point- tol well felt immediate relief

## 2019-05-11 LAB — GLUCOSE, CAPILLARY
Glucose-Capillary: 126 mg/dL — ABNORMAL HIGH (ref 70–99)
Glucose-Capillary: 144 mg/dL — ABNORMAL HIGH (ref 70–99)
Glucose-Capillary: 144 mg/dL — ABNORMAL HIGH (ref 70–99)
Glucose-Capillary: 184 mg/dL — ABNORMAL HIGH (ref 70–99)
Glucose-Capillary: 213 mg/dL — ABNORMAL HIGH (ref 70–99)
Glucose-Capillary: 270 mg/dL — ABNORMAL HIGH (ref 70–99)

## 2019-05-11 LAB — CBC
HCT: 27.5 % — ABNORMAL LOW (ref 39.0–52.0)
Hemoglobin: 8.8 g/dL — ABNORMAL LOW (ref 13.0–17.0)
MCH: 30.3 pg (ref 26.0–34.0)
MCHC: 32 g/dL (ref 30.0–36.0)
MCV: 94.8 fL (ref 80.0–100.0)
Platelets: 94 10*3/uL — ABNORMAL LOW (ref 150–400)
RBC: 2.9 MIL/uL — ABNORMAL LOW (ref 4.22–5.81)
RDW: 16.9 % — ABNORMAL HIGH (ref 11.5–15.5)
WBC: 5.3 10*3/uL (ref 4.0–10.5)
nRBC: 0 % (ref 0.0–0.2)

## 2019-05-11 LAB — COMPREHENSIVE METABOLIC PANEL
ALT: 58 U/L — ABNORMAL HIGH (ref 0–44)
AST: 33 U/L (ref 15–41)
Albumin: 2.3 g/dL — ABNORMAL LOW (ref 3.5–5.0)
Alkaline Phosphatase: 130 U/L — ABNORMAL HIGH (ref 38–126)
Anion gap: 8 (ref 5–15)
BUN: 34 mg/dL — ABNORMAL HIGH (ref 8–23)
CO2: 31 mmol/L (ref 22–32)
Calcium: 8.8 mg/dL — ABNORMAL LOW (ref 8.9–10.3)
Chloride: 99 mmol/L (ref 98–111)
Creatinine, Ser: 1.29 mg/dL — ABNORMAL HIGH (ref 0.61–1.24)
GFR calc Af Amer: >60 mL/min (ref >60)
GFR calc non Af Amer: 57 mL/min — ABNORMAL LOW (ref >60)
Glucose, Bld: 173 mg/dL — ABNORMAL HIGH (ref 70–99)
Potassium: 3.8 mmol/L (ref 3.5–5.1)
Sodium: 138 mmol/L (ref 135–145)
Total Bilirubin: 0.4 mg/dL (ref 0.3–1.2)
Total Protein: 6.3 g/dL — ABNORMAL LOW (ref 6.5–8.1)

## 2019-05-11 NOTE — Progress Notes (Signed)
Pt called desk to report he needed help back to bed-found him on side of bed in between rails- ordered telesitter- and have given them report

## 2019-05-11 NOTE — Progress Notes (Signed)
PROGRESS NOTE  Robert [name] [id number] DOB: July 15, 1951 DOA: 04/11/2019 PCP: Redmond School, MD   LOS: 29 days   Brief Narrative / Interim history:  68 year old male with history of COPD, CAD, diabetes mellitus, ischemic cardiomyopathy, RA, presented to the hospital and was admitted on 04/11/2019 with shortness of breath, he was found to be significantly hypoxic, tested positive for COVID-19, was intubated and transferred to [facility] for further management.  Hospital course complicated by persistent encephalopathy and difficulty weaning due to tachypnea and agitation, he failed extubation once on 8/2.  Hospital course was also complicated by MRSA pneumonia with septic shock at the beginning of August status post completed treatment. Patient eventually was eventually successfully extubated on 8/22 and transferred to the floor on 8/24  Significant events: 8/1 admission to [facility] ICU 8/2 extubated 8/3 early AM agitated, placed on BIPAP, required re-intubation 8/4 started on steroids for COPD exacerbation 8/8 septic shock requiring pressor support 8/9 off pressors, respiratory cultures +s.aureus 8/10 persistent encephalopathy off sedation, CT head ordered 8/11 encephalopathy improved 8/12 difficulty weaning due to tachypnea, ETT exchanged due to clot burden 8/13 PICC line inserted and subclavian central line removed 8/14 Antibiotics coverage broaden due to persistent fever and resp distress, pan cultured  8/19 more alert, tolerating pressure support 8/20 failed voiding trial, Foley needed to be reinserted 8/22 successfully extubated  8/24 transfer to the floor  Subjective: -Patient was mildly confused overnight, where he required tele-sitter  Assessment & Plan: Principal Problem:   Pneumonia due to COVID-19 virus Active Problems:   Rheumatoid arthritis ([place])   Hyperlipidemia   COPD (chronic obstructive pulmonary disease) ([place])   Controlled type 2 diabetes mellitus with diabetic  peripheral angiopathy without gangrene, with long-term current use of insulin (HCC)   Essential hypertension   Ischemic cardiomyopathy   Coronary artery disease   Dyslipidemia (high LDL; low HDL)   Acute respiratory failure with hypoxemia (HCC)   COVID-19 virus detected   Acute respiratory distress syndrome (ARDS) due to COVID-19 virus   Atrial fibrillation with RVR (HCC)   COPD (chronic obstructive pulmonary disease) with emphysema (HCC)   AKI (acute kidney injury) ([place])   Diabetes mellitus type 2, uncontrolled, with complications (HCC)   Pressure injury of skin   Staphylococcal pneumonia ([place])   HCAP (healthcare-associated pneumonia)   Acute Hypoxic Respiratory Failure due to Covid-19 Viral Illness, COPD exacerbation -Extubated on 8/22, remains stable from respiratory standpoint, this morning he remains on 2 L nasal cannula, discussed with staff, they will try to wean down, patient reports he had history of smoking, 1.5 pack/day until admission, might be having some underlining COPD, and may still need some oxygen requirement at baseline. -Continue his inhalers for COPD, no longer wheezing  Fever: Temp (24hrs), Avg:98.5 F (36.9 C), Min:98 F (36.7 C), Max:99.2 F (37.3 C)  Antibiotics:  Cefepime 7/31 >8/4  Flagyl 7/31  Remdesivir 8/1 >8/5  Actemra 8/1  Decadron/solumedrol 8/1 >8/8  Zosyn 8/8>8/9 Azithro 8/7>8/9 Cefepime 8/14 >8/19 Vanc 7/31, 8/8>8/22  COVID-19 interventions: Remdesivir: 8/1 >> 8/5 Steroids: decadron / solumedrol 8/1 >> 8/5 Actemra: 8/1 Convalescent Plasma: none  Vitamin C and Zinc: yes   COVID-19 Labs  No results for input(s): DDIMER, FERRITIN, LDH, CRP in the last 72 hours.  Lab Results  Component Value Date   [place] (A) 05/07/2019   SARSCOV2NAA NOT DETECTED 05/06/2019   SARSCOV2NAA POSITIVE (A) 04/11/2019   SARSCOV2NAA Detected (A) 04/11/2019    Active Problems MRSA HCAP /septic shock -Off pressors  now -Completed  treatment with vancomycin for 3 weeks, last day was 8/22 -Respiratory status improving, extubated on 8/22 AM, currently stable on the floor  Paroxysmal A. fib with RVR -Currently in sinus, on metoprolol 25mg  3 times daily -He was on IV heparin but no anticoagulation is on hold due to bleeding with ETT clotting as well as bleeding from the subclavian line -No further evidence of A. fib on telemetry, his A. fib episode most likely related to his acute illness secondary to COVID-19 infection and respiratory failure .  So far remains in sinus rhythm, with no A. fib recurrence,   Will need anticoagulation if A. fib recurs.  Acute on chronic diastolic CHF, essential hypertension, history of CAD -History of PCI in 2018 with in-stent restenosis, -He denies any chest pain, continue with aspirin, Plavix. -Creatinine 1.3 today, will continue to hold lasix, he appears to be euvolemic  Deconditioning/ICU induced muscle weakness -Aggressive PT/OT -On dysphagia 3 with thick liquids per speech, continue to be followed by SLP,  Hypernatremia -Resolved, sodium now normal  Acute kidney injury -Creatinine peaked as high as 2.39 on 04/24/2019, possibly multifactorial in the setting of diuresis and use of vancomycin -He was also found to have retention requiring Foley catheter placement. -At 1.3 today, will hold Lasix  Acute urinary retention. -In the setting of ICU stay. Failed voiding trial on 8/20 and Foley had to be placed back.  He failed another voiding attempt yesterday, Foley catheter inserted earlier today, continue with Flomax.  ETT clotting/bleeding from the subclavian line -Patient was on therapeutic heparin, ET tube was clotted and exchange on 04/23/2019.  Currently on heparin prophylaxis -Hemoglobin stable  Thrombocytopenia -Chronic, stable  Type 2 diabetes mellitus, uncontrolled with hyperglycemia with vascular disease and neuropathy at baseline -CBGs fair, continue to monitor  CBG (last  3)  Recent Labs    05/11/19 0458 05/11/19 0805 05/11/19 1254  GLUCAP 126* 144* 144*   RA -He is on Actemra and methotrexate at home, currently on hold  Pressure ulcer of medial sacrum bilateral buttocks Pressure Injury 04/17/19 Sacrum Medial Deep Tissue Injury - Purple or maroon localized area of discolored intact skin or blood-filled blister due to damage of underlying soft tissue from pressure and/or shear. dark purple with some skin sloughing on lowe (Active)  04/17/19 0730  Location: Sacrum  Location Orientation: Medial  Staging: Deep Tissue Injury - Purple or maroon localized area of discolored intact skin or blood-filled blister due to damage of underlying soft tissue from pressure and/or shear.  Wound Description (Comments): dark purple with some skin sloughing on lower sacral area  Present on Admission: No     Pressure Injury 04/17/19 Buttocks Right;Lower Unstageable - Full thickness tissue loss in which the base of the ulcer is covered by slough (yellow, tan, gray, green or brown) and/or eschar (tan, brown or black) in the wound bed. (Active)  04/17/19 0730  Location: Buttocks  Location Orientation: Right;Lower  Staging: Unstageable - Full thickness tissue loss in which the base of the ulcer is covered by slough (yellow, tan, gray, green or brown) and/or eschar (tan, brown or black) in the wound bed.  Wound Description (Comments):   Present on Admission: No     Pressure Injury 04/17/19 Buttocks Left;Lower WOC updated to Stage 3 on 05/06/19 at the time of assessment (Active)  04/17/19 0730  Location: Buttocks  Location Orientation: Left;Lower  Staging:   Wound Description (Comments): WOC updated to Stage 3 on 05/06/19 at the time of  assessment  Present on Admission: No     Scheduled Meds: . albuterol  2 puff Inhalation Q6H  . aspirin  81 mg Oral Daily  . bethanechol  10 mg Oral BID  . bisacodyl  10 mg Rectal Once  . chlorhexidine  15 mL Mouth Rinse BID  .  Chlorhexidine Gluconate Cloth  6 each Topical Daily  . clopidogrel  75 mg Oral Daily  . collagenase   Topical Daily  . enoxaparin (LOVENOX) injection  40 mg Subcutaneous Q24H  . feeding supplement (ENSURE ENLIVE)  237 mL Oral TID BM  . folic acid  1 mg Oral Daily  . gabapentin  100 mg Oral Q8H  . insulin aspart  0-15 Units Subcutaneous Q4H  . insulin glargine  15 Units Subcutaneous Daily  . mouth rinse  15 mL Mouth Rinse q12n4p  . metoprolol tartrate  25 mg Oral TID  . mometasone-formoterol  2 puff Inhalation BID  . multivitamin with minerals  1 tablet Oral Daily  . senna-docusate  3 tablet Oral BID  . sodium chloride flush  10-40 mL Intracatheter Q12H  . tamsulosin  0.8 mg Oral QPC supper   Continuous Infusions: . sodium chloride Stopped (May 10, 2019 1136)   PRN Meds:.sodium chloride, acetaminophen, dextrose, hydrALAZINE, Resource ThickenUp Clear, sodium chloride flush, Thrombi-Pad   DVT prophylaxis: Lovenox Code Status: Full code Family Communication: Discussed with patient, discussed with daughter via phone 8/29 Disposition Plan: Need placement, likely will benefit from CIR, but he is still testing positive for COVID-19, will recheck another test today.  Consultants:   PCCM  Micro data: 7/31 blood >NGTD 7/31 sars-cov-2 >positive 8/1 Resp CX > Normal flora 8/7 Resp CX >MRSA 8/7 BCX > NGTD 8/8 UCX > NGTD 8/15 Resp culture>  MRSA    Objective: Vitals:   05/10/19 2350 05/11/19 0535 05/11/19 0800 05/11/19 1018  BP: 113/73 122/61 (!) 125/93   Pulse: 78 78 66 82  Resp: 20 20 19    Temp: 98 F (36.7 C) 99 F (37.2 C) 99.2 F (37.3 C)   TempSrc: Oral Oral Oral   SpO2: 96% 96% 98%   Weight:      Height:        Intake/Output Summary (Last 24 hours) at 05/11/2019 1358 Last data filed at 05/11/2019 1030 Gross per 24 hour  Intake 780 ml  Output 2750 ml  Net -1970 ml   Filed Weights   May 10, 2019 0300 05/06/19 0500 05/07/19 0235  Weight: 104.2 kg 111 kg 106.1 kg     Examination:  Awake Alert, Oriented X 3, No new F.N deficits, Normal affect Symmetrical Chest wall movement, Good air movement bilaterally, CTAB RRR,No Gallops,Rubs or new Murmurs, No Parasternal Heave +ve B.Sounds, Abd Soft, No tenderness, No rebound - guarding or rigidity. No Cyanosis, Clubbing or edema, No new Rash or bruise      Data Reviewed: I have independently reviewed following labs and imaging studies    CBC: Recent Labs  Lab 05/10/19 0455  05/07/19 0130 05/08/19 1208 05/09/19 0058 05/10/19 0650 05/11/19 0123  WBC 6.6   < > 5.6 5.2 4.8 6.3 5.3  NEUTROABS 5.1  --   --   --   --   --   --   HGB 8.8*   < > 8.7* 8.9* 8.9* 8.6* 8.8*  HCT 28.0*   < > 27.2* 27.7* 27.9* 27.2* 27.5*  MCV 96.2   < > 94.8 94.2 93.6 94.4 94.8  PLT 92*   < > 93* 96*  97* 103* 94*   < > = values in this interval not displayed.   Basic Metabolic Panel: Recent Labs  Lab 05/05/19 0455 05/06/19 0640 05/07/19 0130 05/09/19 0058 05/10/19 0650 05/11/19 0123  NA 149* 142 141 141 141 138  K 4.1 3.9 4.0 4.1 4.1 3.8  CL 115* 107 106 105 103 99  CO2 27 26 27 27 30 31   GLUCOSE 222* 176* 175* 184* 127* 173*  BUN 68* 55* 51* 36* 31* 34*  CREATININE 1.22 1.16 1.25* 1.38* 1.31* 1.29*  CALCIUM 8.7* 8.6* 8.7* 9.0 8.9 8.8*  MG 2.1  --   --   --   --   --    GFR: Estimated Creatinine Clearance: 70.9 mL/min (A) (by C-G formula based on SCr of 1.29 mg/dL (H)). Liver Function Tests: Recent Labs  Lab 05/05/19 0455 05/07/19 0130 05/09/19 0058 05/10/19 0650 05/11/19 0123  AST 29 31 26 28  33  ALT 62* 57* 53* 52* 58*  ALKPHOS 94 98 111 112 130*  BILITOT 0.3 0.6 0.2* 0.4 0.4  PROT 5.7* 5.9* 6.0* 6.0* 6.3*  ALBUMIN 2.2* 2.3* 2.3* 2.2* 2.3*   No results for input(s): LIPASE, AMYLASE in the last 168 hours. No results for input(s): AMMONIA in the last 168 hours. Coagulation Profile: No results for input(s): INR, PROTIME in the last 168 hours. Cardiac Enzymes: No results for input(s): CKTOTAL, CKMB,  CKMBINDEX, TROPONINI in the last 168 hours. BNP (last 3 results) No results for input(s): PROBNP in the last 8760 hours. HbA1C: No results for input(s): HGBA1C in the last 72 hours. CBG: Recent Labs  Lab 05/10/19 2106 05/10/19 2351 05/11/19 0458 05/11/19 0805 05/11/19 1254  GLUCAP 220* 213* 126* 144* 144*   Lipid Profile: No results for input(s): CHOL, HDL, LDLCALC, TRIG, CHOLHDL, LDLDIRECT in the last 72 hours. Thyroid Function Tests: No results for input(s): TSH, T4TOTAL, FREET4, T3FREE, THYROIDAB in the last 72 hours. Anemia Panel: No results for input(s): VITAMINB12, FOLATE, FERRITIN, TIBC, IRON, RETICCTPCT in the last 72 hours. Urine analysis:    Component Value Date/Time   COLORURINE YELLOW 04/25/2019 0826   APPEARANCEUR HAZY (A) 04/25/2019 0826   LABSPEC 1.015 04/25/2019 0826   PHURINE 5.0 04/25/2019 0826   GLUCOSEU 150 (A) 04/25/2019 0826   HGBUR LARGE (A) 04/25/2019 0826   BILIRUBINUR NEGATIVE 04/25/2019 0826   KETONESUR NEGATIVE 04/25/2019 0826   PROTEINUR 30 (A) 04/25/2019 0826   UROBILINOGEN 0.2 12/22/2011 2130   NITRITE NEGATIVE 04/25/2019 0826   LEUKOCYTESUR NEGATIVE 04/25/2019 0826   Sepsis Labs: Invalid input(s): PROCALCITONIN, LACTICIDVEN  Recent Results (from the past 240 hour(s))  Novel Coronavirus, NAA (hospital order; send-out to ref lab)     Status: None   Collection Time: 05/06/19 10:16 AM   Specimen: Nasopharyngeal Swab; Respiratory  Result Value Ref Range Status   SARS-CoV-2, NAA NOT DETECTED NOT DETECTED Final    Comment: (NOTE) This test was developed and its performance characteristics determined by [employer]. This test has not been FDA cleared or approved. This test has been authorized by FDA under an Emergency Use Authorization (EUA). This test is only authorized for the duration of time the declaration that circumstances exist justifying the authorization of the emergency use of in vitro diagnostic tests for detection of  SARS-CoV-2 virus and/or diagnosis of COVID-19 infection under section 564(b)(1) of the Act, 21 U.S.C. [id number]), unless the authorization is terminated or revoked sooner. When diagnostic testing is negative, the possibility of a false negative result should be considered  in the context of a patient's recent exposures and the presence of clinical signs and symptoms consistent with COVID-19. An individual without symptoms of COVID-19 and who is not shedding SARS-CoV-2 virus would expect to have a negative (not detected) result in this assay. Performed  At: [facility], [place] [id number] Rush Farmer MD [id number]    [place]  Final    Comment: Performed at [facility] [address], [place], [place] [address]  SARS Coronavirus 2 [facility] order, Performed in [facility] hospital lab)     Status: Abnormal   Collection Time: 05/07/19  5:23 PM  Result Value Ref Range Status   SARS Coronavirus 2 POSITIVE (A) NEGATIVE Final    Comment: CRITICAL RESULT CALLED TO, READ BACK BY AND VERIFIED WITH: RN T SCEGEN AT 2143 05/07/19 CRUICKSHANK A (NOTE) If result is NEGATIVE SARS-CoV-2 target nucleic acids are NOT DETECTED. The SARS-CoV-2 RNA is generally detectable in upper and lower  respiratory specimens during the acute phase of infection. The lowest  concentration of SARS-CoV-2 viral copies this assay can detect is 250  copies / mL. A negative result does not preclude SARS-CoV-2 infection  and should not be used as the sole basis for treatment or other  patient management decisions.  A negative result may occur with  improper specimen collection / handling, submission of specimen other  than nasopharyngeal swab, presence of viral mutation(s) within the  areas targeted by this assay, and inadequate number of viral copies  (<250 copies / mL). A negative result must be combined with clinical  observations,  patient history, and epidemiological information. If result is POSITIVE SARS-CoV-2 target nucleic acids a re DETECTED. The SARS-CoV-2 RNA is generally detectable in upper and lower  respiratory specimens during the acute phase of infection.  Positive  results are indicative of active infection with SARS-CoV-2.  Clinical  correlation with patient history and other diagnostic information is  necessary to determine patient infection status.  Positive results do  not rule out bacterial infection or co-infection with other viruses. If result is PRESUMPTIVE POSTIVE SARS-CoV-2 nucleic acids MAY BE PRESENT.   A presumptive positive result was obtained on the submitted specimen  and confirmed on repeat testing.  While 2019 novel coronavirus  (SARS-CoV-2) nucleic acids may be present in the submitted sample  additional confirmatory testing may be necessary for epidemiological  and / or clinical management purposes  to differentiate between  SARS-CoV-2 and other Sarbecovirus currently known to infect humans.  If clinically indicated additional testing with an alternate test  methodolo gy [phone]) is advised. The SARS-CoV-2 RNA is generally  detectable in upper and lower respiratory specimens during the acute  phase of infection. The expected result is Negative. Fact Sheet for Patients:  StrictlyIdeas.no Fact Sheet for Healthcare Providers: BankingDealers.co.za This test is not yet approved or cleared by the [place] FDA and has been authorized for detection and/or diagnosis of SARS-CoV-2 by FDA under an Emergency Use Authorization (EUA).  This EUA will remain in effect (meaning this test can be used) for the duration of the COVID-19 declaration under Section 564(b)(1) of the Act, 21 U.S.C. section 360bbb-3(b)(1), unless the authorization is terminated or revoked sooner. Performed at [facility], [place] [address],  [place],  [address]      Radiology Studies: No results found.   Phillips Climes MD [facility]  Contact via  [email or web address]  [place] P: [phone] F: [phone]

## 2019-05-12 LAB — GLUCOSE, CAPILLARY
Glucose-Capillary: 136 mg/dL — ABNORMAL HIGH (ref 70–99)
Glucose-Capillary: 160 mg/dL — ABNORMAL HIGH (ref 70–99)
Glucose-Capillary: 162 mg/dL — ABNORMAL HIGH (ref 70–99)
Glucose-Capillary: 177 mg/dL — ABNORMAL HIGH (ref 70–99)
Glucose-Capillary: 197 mg/dL — ABNORMAL HIGH (ref 70–99)
Glucose-Capillary: 197 mg/dL — ABNORMAL HIGH (ref 70–99)
Glucose-Capillary: 218 mg/dL — ABNORMAL HIGH (ref 70–99)

## 2019-05-12 NOTE — Progress Notes (Signed)
  Speech Language Pathology Treatment: Dysphagia  Patient Details Name: Robert [name] MRN: [id number] DOB: [birth year] Today's Date: 05/12/2019 Time: [id number] SLP Time Calculation (min) (ACUTE ONLY): 13 min  Assessment / Plan / Recommendation Clinical Impression  Pt continues to have immediate coughing with sips of thin liquid given in small volumes via cup or spoon. Only a single cough was noted across an entire container of nectar thick liquids, also consumed via larger boluses. He acknowledges that he was more confused last week, and that he cannot remember what his voice sounded like then, but he agrees that it is hoarse today. Recommend continuing current diet with additional SLP f/u to facilitate return to more advanced consistencies.    HPI HPI: 68 year old male with history of COPD, CAD, diabetes mellitus, ischemic cardiomyopathy, RA, presented to the hospital and was admitted on 04/11/2019 with shortness of breath, he was found to be significantly hypoxic, tested positive for COVID-19, was intubated and transferred to [facility] for further management. Intubated 8/1-8/2, reintubated 8/3-8/22.       SLP Plan  Continue with current plan of care       Recommendations  Diet recommendations: Dysphagia 3 (mechanical soft);Nectar-thick liquid Liquids provided via: Cup;Straw Medication Administration: Whole meds with puree Supervision: Patient able to self feed Compensations: Multiple dry swallows after each bite/sip;Clear throat intermittently Postural Changes and/or Swallow Maneuvers: Seated upright 90 degrees                Oral Care Recommendations: Oral care BID Follow up Recommendations: Inpatient Rehab SLP Visit Diagnosis: Dysphagia, oropharyngeal phase (R13.12) Plan: Continue with current plan of care       GO                Robert [name] Robert [name] 05/12/2019, 2:26 PM  Pollyann Glen, M.A. [employer] Acute Environmental education officer [phone] Office [phone]

## 2019-05-12 NOTE — Progress Notes (Signed)
PROGRESS NOTE  Robert [name] [id number] DOB: 10/25/1950 DOA: 04/11/2019 PCP: Redmond School, MD   LOS: 30 days   Brief Narrative / Interim history:  68 year old male with history of COPD, CAD, diabetes mellitus, ischemic cardiomyopathy, RA, presented to the hospital and was admitted on 04/11/2019 with shortness of breath, he was found to be significantly hypoxic, tested positive for COVID-19, was intubated and transferred to [facility] for further management.  Hospital course complicated by persistent encephalopathy and difficulty weaning due to tachypnea and agitation, he failed extubation once on 8/2.  Hospital course was also complicated by MRSA pneumonia with septic shock at the beginning of August status post completed treatment. Patient eventually was eventually successfully extubated on 8/22 and transferred to the floor on 8/24  Significant events: 8/1 admission to [facility] ICU 8/2 extubated 8/3 early AM agitated, placed on BIPAP, required re-intubation 8/4 started on steroids for COPD exacerbation 8/8 septic shock requiring pressor support 8/9 off pressors, respiratory cultures +s.aureus 8/10 persistent encephalopathy off sedation, CT head ordered 8/11 encephalopathy improved 8/12 difficulty weaning due to tachypnea, ETT exchanged due to clot burden 8/13 PICC line inserted and subclavian central line removed 8/14 Antibiotics coverage broaden due to persistent fever and resp distress, pan cultured  8/19 more alert, tolerating pressure support 8/20 failed voiding trial, Foley needed to be reinserted 8/22 successfully extubated  8/24 transfer to the floor  Subjective: -No significant events overnight as discussed with staff.  Assessment & Plan: Principal Problem:   Pneumonia due to COVID-19 virus Active Problems:   Rheumatoid arthritis ([place])   Hyperlipidemia   COPD (chronic obstructive pulmonary disease) (HCC)   Controlled type 2 diabetes mellitus with diabetic peripheral  angiopathy without gangrene, with long-term current use of insulin (HCC)   Essential hypertension   Ischemic cardiomyopathy   Coronary artery disease   Dyslipidemia (high LDL; low HDL)   Acute respiratory failure with hypoxemia (HCC)   COVID-19 virus detected   Acute respiratory distress syndrome (ARDS) due to COVID-19 virus   Atrial fibrillation with RVR (HCC)   COPD (chronic obstructive pulmonary disease) with emphysema (HCC)   AKI (acute kidney injury) ([place])   Diabetes mellitus type 2, uncontrolled, with complications (HCC)   Pressure injury of skin   Staphylococcal pneumonia ([place])   HCAP (healthcare-associated pneumonia)   Acute Hypoxic Respiratory Failure due to Covid-19 Viral Illness, COPD exacerbation -Extubated on 8/22, remains stable from respiratory standpoint, this morning he remains on 2 L nasal cannula, discussed with staff, they will try to wean down, patient reports he had history of smoking, 1.5 pack/day until admission, might be having some underlining COPD, and may still need some oxygen requirement at baseline. -Continue his inhalers for COPD, no longer wheezing  Fever: Temp (24hrs), Avg:98.6 F (37 C), Min:98.3 F (36.8 C), Max:98.9 F (37.2 C)  Antibiotics:  Cefepime 7/31 >8/4  Flagyl 7/31  Remdesivir 8/1 >8/5  Actemra 8/1  Decadron/solumedrol 8/1 >8/8  Zosyn 8/8>8/9 Azithro 8/7>8/9 Cefepime 8/14 >8/19 Vanc 7/31, 8/8>8/22  COVID-19 interventions: Remdesivir: 8/1 >> 8/5 Steroids: decadron / solumedrol 8/1 >> 8/5 Actemra: 8/1 Convalescent Plasma: none  Vitamin C and Zinc: yes   COVID-19 Labs  No results for input(s): DDIMER, FERRITIN, LDH, CRP in the last 72 hours.  Lab Results  Component Value Date   [place] (A) 05/07/2019   SARSCOV2NAA NOT DETECTED 05/06/2019   SARSCOV2NAA POSITIVE (A) 04/11/2019   SARSCOV2NAA Detected (A) 04/11/2019    Active Problems MRSA HCAP /septic shock -Off pressors now -  Completed treatment with  vancomycin for 3 weeks, last day was 8/22 -Respiratory status improving, extubated on 8/22 AM, currently stable on the floor  Paroxysmal A. fib with RVR -Currently in sinus, on metoprolol 25mg  3 times daily -He was on IV heparin but no anticoagulation is on hold due to bleeding with ETT clotting as well as bleeding from the subclavian line -No further evidence of A. fib on telemetry, his A. fib episode most likely related to his acute illness secondary to COVID-19 infection and respiratory failure .  So far remains in sinus rhythm, with no A. fib recurrence,   Will need anticoagulation if A. fib recurs.  Acute on chronic diastolic CHF, essential hypertension, history of CAD -History of PCI in 2018 with in-stent restenosis, -He denies any chest pain, continue with aspirin, Plavix. -Creatinine 1.3 today, will continue to hold lasix, he appears to be euvolemic  Deconditioning/ICU induced muscle weakness -Aggressive PT/OT -On dysphagia 3 with thick liquids per speech, continue to be followed by SLP,  Hypernatremia -Resolved, sodium now normal  Acute kidney injury -Creatinine peaked as high as 2.39 on 04/24/2019, possibly multifactorial in the setting of diuresis and use of vancomycin -He was also found to have retention requiring Foley catheter placement. -At 1.3 today, will hold Lasix  Acute urinary retention. -In the setting of ICU stay. Failed voiding trial on 8/20 and Foley had to be placed back.  He failed another voiding attempt yesterday, Foley catheter inserted earlier today, continue with Flomax.  ETT clotting/bleeding from the subclavian line -Patient was on therapeutic heparin, ET tube was clotted and exchange on 04/23/2019.  Currently on heparin prophylaxis -Hemoglobin stable  Thrombocytopenia -Chronic, stable  Type 2 diabetes mellitus, uncontrolled with hyperglycemia with vascular disease and neuropathy at baseline -CBGs fair, continue to monitor  CBG (last 3)  Recent  Labs    05/12/19 0415 05/12/19 0738 05/12/19 1127  GLUCAP 162* 136* 197*   RA -He is on Actemra and methotrexate at home, currently on hold  Pressure ulcer of medial sacrum bilateral buttocks -Wound care service consulted, will follow recommendations Pressure Injury 04/17/19 Sacrum Medial Deep Tissue Injury - Purple or maroon localized area of discolored intact skin or blood-filled blister due to damage of underlying soft tissue from pressure and/or shear. dark purple with some skin sloughing on lowe (Active)  04/17/19 0730  Location: Sacrum  Location Orientation: Medial  Staging: Deep Tissue Injury - Purple or maroon localized area of discolored intact skin or blood-filled blister due to damage of underlying soft tissue from pressure and/or shear.  Wound Description (Comments): dark purple with some skin sloughing on lower sacral area  Present on Admission: No     Pressure Injury 04/17/19 Buttocks Right;Lower Unstageable - Full thickness tissue loss in which the base of the ulcer is covered by slough (yellow, tan, gray, green or brown) and/or eschar (tan, brown or black) in the wound bed. (Active)  04/17/19 0730  Location: Buttocks  Location Orientation: Right;Lower  Staging: Unstageable - Full thickness tissue loss in which the base of the ulcer is covered by slough (yellow, tan, gray, green or brown) and/or eschar (tan, brown or black) in the wound bed.  Wound Description (Comments):   Present on Admission: No     Pressure Injury 04/17/19 Buttocks Left;Lower WOC updated to Stage 3 on 05/06/19 at the time of assessment (Active)  04/17/19 0730  Location: Buttocks  Location Orientation: Left;Lower  Staging:   Wound Description (Comments): WOC updated to Stage 3  on 05/06/19 at the time of assessment  Present on Admission: No     Scheduled Meds:  albuterol  2 puff Inhalation Q6H   aspirin  81 mg Oral Daily   bethanechol  10 mg Oral BID   bisacodyl  10 mg Rectal Once    chlorhexidine  15 mL Mouth Rinse BID   Chlorhexidine Gluconate Cloth  6 each Topical Daily   clopidogrel  75 mg Oral Daily   collagenase   Topical Daily   enoxaparin (LOVENOX) injection  40 mg Subcutaneous Q24H   feeding supplement (ENSURE ENLIVE)  237 mL Oral TID BM   folic acid  1 mg Oral Daily   gabapentin  100 mg Oral Q8H   insulin aspart  0-15 Units Subcutaneous Q4H   insulin glargine  15 Units Subcutaneous Daily   mouth rinse  15 mL Mouth Rinse q12n4p   metoprolol tartrate  25 mg Oral TID   mometasone-formoterol  2 puff Inhalation BID   multivitamin with minerals  1 tablet Oral Daily   senna-docusate  3 tablet Oral BID   sodium chloride flush  10-40 mL Intracatheter Q12H   tamsulosin  0.8 mg Oral QPC supper   Continuous Infusions:  sodium chloride Stopped (05/05/19 1136)   PRN Meds:.sodium chloride, acetaminophen, dextrose, hydrALAZINE, Resource ThickenUp Clear, sodium chloride flush, Thrombi-Pad   DVT prophylaxis: Lovenox Code Status: Full code Family Communication: Discussed with patient, discussed with daughter via phone 8/29 Disposition Plan: Need placement, likely will benefit from CIR, but he is still testing positive for COVID-19, will recheck another test today.  Consultants:   PCCM  Micro data: 7/31 blood >NGTD 7/31 sars-cov-2 >positive 8/1 Resp CX > Normal flora 8/7 Resp CX >MRSA 8/7 BCX > NGTD 8/8 UCX > NGTD 8/15 Resp culture>  MRSA    Objective: Vitals:   05/12/19 0035 05/12/19 0420 05/12/19 0742 05/12/19 1130  BP: 111/65 117/68 (!) 121/56 110/62  Pulse: 78 79 81 79  Resp: (!) 24 18 (!) 21 (!) 24  Temp: 98.3 F (36.8 C) 98.7 F (37.1 C) 98.3 F (36.8 C) 98.3 F (36.8 C)  TempSrc: Oral Oral  Oral  SpO2: 93% 98% 93% 92%  Weight:      Height:        Intake/Output Summary (Last 24 hours) at 05/12/2019 1325 Last data filed at 05/12/2019 0600 Gross per 24 hour  Intake 240 ml  Output 1525 ml  Net -1285 ml   Filed Weights     05/05/19 0300 05/06/19 0500 May 22, 2019 0235  Weight: 104.2 kg 111 kg 106.1 kg    Examination:  Awake Alert, Oriented X 3, No new F.N deficits, Normal affect Symmetrical Chest wall movement, Good air movement bilaterally, CTAB RRR,No Gallops,Rubs or new Murmurs, No Parasternal Heave +ve B.Sounds, Abd Soft, No tenderness, No rebound - guarding or rigidity. No Cyanosis, Clubbing or edema, No new Rash or bruise         Data Reviewed: I have independently reviewed following labs and imaging studies    CBC: Recent Labs  Lab May 22, 2019 0130 05/08/19 1208 05/09/19 0058 05/10/19 0650 05/11/19 0123  WBC 5.6 5.2 4.8 6.3 5.3  HGB 8.7* 8.9* 8.9* 8.6* 8.8*  HCT 27.2* 27.7* 27.9* 27.2* 27.5*  MCV 94.8 94.2 93.6 94.4 94.8  PLT 93* 96* 97* 103* 94*   Basic Metabolic Panel: Recent Labs  Lab 05/06/19 0640 05/22/2019 0130 05/09/19 0058 05/10/19 0650 05/11/19 0123  NA 142 141 141 141 138  K 3.9 4.0  4.1 4.1 3.8  CL 107 106 105 103 99  CO2 26 27 27 30 31   GLUCOSE 176* 175* 184* 127* 173*  BUN 55* 51* 36* 31* 34*  CREATININE 1.16 1.25* 1.38* 1.31* 1.29*  CALCIUM 8.6* 8.7* 9.0 8.9 8.8*   GFR: Estimated Creatinine Clearance: 70.9 mL/min (A) (by C-G formula based on SCr of 1.29 mg/dL (H)). Liver Function Tests: Recent Labs  Lab 05/07/19 0130 05/09/19 0058 05/10/19 0650 05/11/19 0123  AST 31 26 28  33  ALT 57* 53* 52* 58*  ALKPHOS 98 111 112 130*  BILITOT 0.6 0.2* 0.4 0.4  PROT 5.9* 6.0* 6.0* 6.3*  ALBUMIN 2.3* 2.3* 2.2* 2.3*   No results for input(s): LIPASE, AMYLASE in the last 168 hours. No results for input(s): AMMONIA in the last 168 hours. Coagulation Profile: No results for input(s): INR, PROTIME in the last 168 hours. Cardiac Enzymes: No results for input(s): CKTOTAL, CKMB, CKMBINDEX, TROPONINI in the last 168 hours. BNP (last 3 results) No results for input(s): PROBNP in the last 8760 hours. HbA1C: No results for input(s): HGBA1C in the last 72  hours. CBG: Recent Labs  Lab 05/11/19 1940 05/12/19 0033 05/12/19 0415 05/12/19 0738 05/12/19 1127  GLUCAP 270* 197* 162* 136* 197*   Lipid Profile: No results for input(s): CHOL, HDL, LDLCALC, TRIG, CHOLHDL, LDLDIRECT in the last 72 hours. Thyroid Function Tests: No results for input(s): TSH, T4TOTAL, FREET4, T3FREE, THYROIDAB in the last 72 hours. Anemia Panel: No results for input(s): VITAMINB12, FOLATE, FERRITIN, TIBC, IRON, RETICCTPCT in the last 72 hours. Urine analysis:    Component Value Date/Time   COLORURINE YELLOW 04/25/2019 0826   APPEARANCEUR HAZY (A) 04/25/2019 0826   LABSPEC 1.015 04/25/2019 0826   PHURINE 5.0 04/25/2019 0826   GLUCOSEU 150 (A) 04/25/2019 0826   HGBUR LARGE (A) 04/25/2019 0826   BILIRUBINUR NEGATIVE 04/25/2019 0826   KETONESUR NEGATIVE 04/25/2019 0826   PROTEINUR 30 (A) 04/25/2019 0826   UROBILINOGEN 0.2 12/22/2011 2130   NITRITE NEGATIVE 04/25/2019 0826   LEUKOCYTESUR NEGATIVE 04/25/2019 0826   Sepsis Labs: Invalid input(s): PROCALCITONIN, LACTICIDVEN  Recent Results (from the past 240 hour(s))  Novel Coronavirus, NAA (hospital order; send-out to ref lab)     Status: None   Collection Time: 05/06/19 10:16 AM   Specimen: Nasopharyngeal Swab; Respiratory  Result Value Ref Range Status   SARS-CoV-2, NAA NOT DETECTED NOT DETECTED Final    Comment: (NOTE) This test was developed and its performance characteristics determined by [employer]. This test has not been FDA cleared or approved. This test has been authorized by FDA under an Emergency Use Authorization (EUA). This test is only authorized for the duration of time the declaration that circumstances exist justifying the authorization of the emergency use of in vitro diagnostic tests for detection of SARS-CoV-2 virus and/or diagnosis of COVID-19 infection under section 564(b)(1) of the Act, 21 U.S.C. [id number]), unless the authorization is terminated or revoked sooner.  When diagnostic testing is negative, the possibility of a false negative result should be considered in the context of a patient's recent exposures and the presence of clinical signs and symptoms consistent with COVID-19. An individual without symptoms of COVID-19 and who is not shedding SARS-CoV-2 virus would expect to have a negative (not detected) result in this assay. Performed  At: [facility] [place], [place] [id number] Rush Farmer MD [id number]    [place]  Final    Comment: Performed at [facility]  [address] [place], [place] [address]  SARS Coronavirus 2 [facility] order, Performed in [facility] hospital lab)     Status: Abnormal   Collection Time: 05/07/19  5:23 PM  Result Value Ref Range Status   SARS Coronavirus 2 POSITIVE (A) NEGATIVE Final    Comment: CRITICAL RESULT CALLED TO, READ BACK BY AND VERIFIED WITH: RN T SCEGEN AT 2143 05/07/19 CRUICKSHANK A (NOTE) If result is NEGATIVE SARS-CoV-2 target nucleic acids are NOT DETECTED. The SARS-CoV-2 RNA is generally detectable in upper and lower  respiratory specimens during the acute phase of infection. The lowest  concentration of SARS-CoV-2 viral copies this assay can detect is 250  copies / mL. A negative result does not preclude SARS-CoV-2 infection  and should not be used as the sole basis for treatment or other  patient management decisions.  A negative result may occur with  improper specimen collection / handling, submission of specimen other  than nasopharyngeal swab, presence of viral mutation(s) within the  areas targeted by this assay, and inadequate number of viral copies  (<250 copies / mL). A negative result must be combined with clinical  observations, patient history, and epidemiological information. If result is POSITIVE SARS-CoV-2 target nucleic acids a re DETECTED. The SARS-CoV-2 RNA is generally detectable in upper and  lower  respiratory specimens during the acute phase of infection.  Positive  results are indicative of active infection with SARS-CoV-2.  Clinical  correlation with patient history and other diagnostic information is  necessary to determine patient infection status.  Positive results do  not rule out bacterial infection or co-infection with other viruses. If result is PRESUMPTIVE POSTIVE SARS-CoV-2 nucleic acids MAY BE PRESENT.   A presumptive positive result was obtained on the submitted specimen  and confirmed on repeat testing.  While 2019 novel coronavirus  (SARS-CoV-2) nucleic acids may be present in the submitted sample  additional confirmatory testing may be necessary for epidemiological  and / or clinical management purposes  to differentiate between  SARS-CoV-2 and other Sarbecovirus currently known to infect humans.  If clinically indicated additional testing with an alternate test  methodolo gy [phone]) is advised. The SARS-CoV-2 RNA is generally  detectable in upper and lower respiratory specimens during the acute  phase of infection. The expected result is Negative. Fact Sheet for Patients:  StrictlyIdeas.no Fact Sheet for Healthcare Providers: BankingDealers.co.za This test is not yet approved or cleared by the [place] FDA and has been authorized for detection and/or diagnosis of SARS-CoV-2 by FDA under an Emergency Use Authorization (EUA).  This EUA will remain in effect (meaning this test can be used) for the duration of the COVID-19 declaration under Section 564(b)(1) of the Act, 21 U.S.C. section 360bbb-3(b)(1), unless the authorization is terminated or revoked sooner. Performed at [facility], [place] [address], [place], [place] [address]      Radiology Studies: No results found.   Phillips Climes MD [facility]  Contact via  [email or web address]  [place] P: [phone] F:  [phone]

## 2019-05-12 NOTE — Consult Note (Signed)
Margaret Nurse wound follow up Patient receiving care in [place].  Consult completed remotely with the assistance and bedside assessment of primary RN, Eliezer Lofts. Wound type: Unstageable PIs to bilateral buttocks and coccyx Measurement: To be provided by the bedside RN in the flowsheet section Wound bed: Left buttock wound is 100% yellow.  Right buttock wound is 100% brown eschar with a narrow ring of yellow.  The coccyx wound is 100% yellow. Drainage (amount, consistency, odor) none reported Periwound: intact  Dressing procedure/placement/frequency: Apply Santyl to right and left buttock wounds and yellow area on coccyx  in a nickel thick layer. Cover with a saline moistened gauze, then vaseline gauze and then foam dressings.  Change daily. Monitor the wound area(s) for worsening of condition such as: Signs/symptoms of infection,  Increase in size,  Development of or worsening of odor, Development of pain, or increased pain at the affected locations.  Notify the medical team if any of these develop.  Thank you for the consult.  Discussed plan of care with the bedside nurse. Val Riles, RN, MSN, CWOCN, CNS-BC, pager [phone]

## 2019-05-12 NOTE — Progress Notes (Addendum)
Occupational Therapy Treatment Patient Details Name: Robert [name] MRN: [id number] DOB: 11-27-1950 Today's Date: 05/12/2019    History of present illness 68 yo male [place] HTN,/CAD,GERD, DM,RA, T3 compression fx, wrist fx, COPD,ischemic myopathy presented to the hospital and was admitted on 04/11/2019 with shortness of breath, he was found to be significantly hypoxic, tested positive for COVID-19, was intubated and transferred to [facility] for further management   OT comments  Making excellent progress. Demonstrating increased awareness and insight into deficits. Pt able to recount event regarding moving to the [place] and stated "It was like I was having a dream that I was going to [place] so I was getting out of bed to go". Able to stand x 3 from bed using Stedy with +2 Max A. +2 Mod assist from [place]. Able to complete grooming with set up. Able to feed himself but is frustrated about dropping items frequently - gave pt ideas to compensate for weakness. Pt very motivated to get stronger and return to PLOF. Spoke with daughter regarding pt's progress toward end of session. Will continue to follow acutely. Excellent CIR candidate.   Follow Up Recommendations  CIR;Supervision/Assistance - 24 hour    Equipment Recommendations  3 in 1 bedside commode    Recommendations for Other Services Rehab consult    Precautions / Restrictions Precautions Precautions: Fall Precaution Comments: sacral wound Other Brace: B Prevalon boots       Mobility Bed Mobility Overal bed mobility: Needs Assistance Bed Mobility: Rolling;Sidelying to Sit;Sit to Sidelying Rolling: Supervision Sidelying to sit: Min guard     Sit to sidelying: Min guard;Min assist General bed mobility comments: very min assist with legs  back onto bed, performed x 2 suo to sit.  Transfers Overall transfer level: Needs assistance   Transfers: Sit to/from Stand Sit to Stand: +2 physical assistance;+2 safety/equipment;Max  assist(Mod A from elevated Stedy plates; Max A +2 from bed)         General transfer comment: with use of sheet at pelvis to assist rising from the bed and STEDY seat. Performed x 3. stood for 30 secs x 2. holding to front bar, assisting to pull self up into standing    Balance Overall balance assessment: Needs assistance Sitting-balance support: Bilateral upper extremity supported;Feet supported Sitting balance-Leahy Scale: Fair         Standing balance comment: while in stedy, able to keep trunk upright while flexed @ 30 degrees at hips                           ADL either performed or assessed with clinical judgement   ADL Overall ADL's : Needs assistance/impaired Eating/Feeding: Set up;Supervision/ safety(nectar thick liquids)   Grooming: Supervision/safety;Set up;Sitting;Oral care;Wash/dry face   Upper Body Bathing: Supervision/ safety;Set up;Bed level   Lower Body Bathing: Moderate assistance;Bed level   Upper Body Dressing : Moderate assistance;Sitting   Lower Body Dressing: Maximal assistance;+2 for safety/equipment;Cueing for safety;Sit to/from stand               Functional mobility during ADLs: +2 for physical assistance;Moderate assistance(using Stedy)       Vision       Perception     Praxis      Cognition Arousal/Alertness: Awake/alert Behavior During Therapy: WFL for tasks assessed/performed Overall Cognitive Status: Impaired/Different from baseline Area of Impairment: Attention;Memory;Safety/judgement;Awareness;Problem solving  Current Attention Level: Selective Memory: Decreased short-term memory   Safety/Judgement: Decreased awareness of safety;Decreased awareness of deficits Awareness: Emergent Problem Solving: Slow processing General Comments: Pt reported that he was trying to go to [place] this am when he was trying to get out of bed. 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        Exercises General Exercises - Upper Extremity Shoulder Flexion: Strengthening;Both;15 reps;Seated;Theraband Theraband Level (Shoulder Flexion): Level 1 (Yellow) Shoulder Extension: Strengthening;Both;15 reps;Seated;Theraband Theraband Level (Shoulder Extension): Level 1 (Yellow) Shoulder ABduction: Strengthening;Both;15 reps;Theraband Theraband Level (Shoulder Abduction): Level 1 (Yellow) Elbow Flexion: Strengthening;Both;15 reps;Seated;Theraband Theraband Level (Elbow Flexion): Level 1 (Yellow) Elbow Extension: Strengthening;Both;15 reps;Seated;Theraband Theraband Level (Elbow Extension): Level 1 (Yellow) Wrist Flexion: AAROM;Both;10 reps Wrist Extension: 10 reps;AAROM;Both Digit Composite Flexion: AROM;Both;10 reps Composite Extension: AROM;Strengthening;Both;10 reps;Supine Other Exercises Other Exercises: Pt able to hold BLE in bridge position and push through BLE while scooting up in bedf   Shoulder Instructions       General Comments      Pertinent Vitals/ Pain       Pain Assessment: Faces Faces Pain Scale: Hurts little more Pain Location: buttock Pain Descriptors / Indicators: Discomfort;Grimacing Pain Intervention(s): Limited activity within patient's tolerance;Repositioned  Home Living                                          Prior Functioning/Environment              Frequency  Min 3X/week        Progress Toward Goals  OT Goals(current goals can now be found in the care plan section)  Progress towards OT goals: Progressing toward goals  Acute Rehab OT Goals Patient Stated Goal: to get stronger OT Goal Formulation: With patient Time For Goal Achievement: 05/19/19 Potential to Achieve Goals: Good ADL Goals Pt Will Perform Eating: with set-up;with supervision;sitting;with adaptive utensils Pt Will Perform Grooming: with set-up;with supervision;sitting Pt Will Perform Upper Body Bathing: with  set-up;with supervision;sitting Pt/caregiver will Perform Home Exercise Program: Increased strength;Both right and left upper extremity;With theraband;With written HEP provided;With minimal assist Additional ADL Goal #1: Pt will complete sit - stand with Mod A +2 in preparation for toilet transfers Additional ADL Goal #2: Pt will participate in 15 minute ADL task with SpO2 remaining above 90.  Plan Discharge plan remains appropriate    Co-evaluation    PT/OT/SLP Co-Evaluation/Treatment: Yes Reason for Co-Treatment: Complexity of the patient's impairments (multi-system involvement);Necessary to address cognition/behavior during functional activity;For patient/therapist safety PT goals addressed during session: Mobility/safety with mobility OT goals addressed during session: ADL's and self-care;Strengthening/ROM      AM-PAC OT "6 Clicks" Daily Activity     Outcome Measure   Help from another person eating meals?: A Little Help from another person taking care of personal grooming?: A Little Help from another person toileting, which includes using toliet, bedpan, or urinal?: A Lot Help from another person bathing (including washing, rinsing, drying)?: A Lot Help from another person to put on and taking off regular upper body clothing?: A Lot Help from another person to put on and taking off regular lower body clothing?: A Lot 6 Click Score: 14    End of Session Equipment Utilized During Treatment: Oxygen  OT Visit Diagnosis: Other abnormalities of gait and mobility (R26.89);Muscle weakness (generalized) (M62.81);Other symptoms and signs involving cognitive function;Pain  Pain - part of body: (buttocks)   Activity Tolerance Patient tolerated treatment well   Patient Left in chair;with call bell/phone within reach;with chair alarm set   Nurse Communication Mobility status;Need for lift equipment;Other (comment)        [id number] OT Time Calculation (min): 38 min  Charges:  OT General Charges $OT Visit: 1 Visit OT Treatments $Self Care/Home Management : 8-22 mins $Neuromuscular Re-education: 8-22 mins  Maurie Boettcher, OT/L   Acute OT Clinical Specialist Acute Rehabilitation Services Pager [phone] Office [phone]    [facility] 05/12/2019, 6:10 PM

## 2019-05-12 NOTE — Progress Notes (Signed)
Physical Therapy Treatment Patient Details Name: Robert [name] MRN: [id number] DOB: [birth year] Today's Date: 05/12/2019    History of Present Illness 68 yo male [place] HTN,/CAD,GERD, DM,RA, T3 compression fx, wrist fx, COPD,ischemic myopathy presented to the hospital and was admitted on 04/11/2019 with shortness of breath, he was found to be significantly hypoxic, tested positive for COVID-19, was intubated and transferred to [facility] for further management    PT Comments    The patient is making excellent progress in strength and mobility. Today able to sit up to bed edge and return to supine with min guard. Patient able to assist with standing in STEDy x 3 and trunk in upright posture. Recommend CIR.   Follow Up Recommendations  CIR     Equipment Recommendations  (tba)    Recommendations for Other Services Rehab consult     Precautions / Restrictions Precautions Precautions: Fall Precaution Comments: scrotal edema; sacral wound, has telesitter    Mobility  Bed Mobility   Bed Mobility: Rolling;Sidelying to Sit;Sit to Sidelying Rolling: Supervision Sidelying to sit: Min guard     Sit to sidelying: Min guard;Min assist General bed mobility comments: very min assist with legs  back onto bed, performed x 2 suo to sit.  Transfers Overall transfer level: Needs assistance   Transfers: Sit to/from Stand Sit to Stand: +2 physical assistance;+2 safety/equipment;Max assist         General transfer comment: with use of sheet at pelvis to assist rising from the bed and STEDY seat. Performed x 3. stood for 30 secs x 2. holding to front bar, assisting to pull self up into standing  Ambulation/Gait                 Stairs             Wheelchair Mobility    Modified Rankin (Stroke Patients Only)       Balance Overall balance assessment: Needs assistance Sitting-balance support: Bilateral upper extremity supported;Feet supported Sitting balance-Leahy Scale:  Fair         Standing balance comment: while in stedy, able to keep trunk upright                            Cognition Arousal/Alertness: Awake/alert Behavior During Therapy: WFL for tasks assessed/performed   Area of Impairment: Attention;Awareness;Problem solving                   Current Attention Level: Sustained       Awareness: Emergent   General Comments: pt oriented to time, stated he had a dream about cousins from [place].      Exercises      General Comments        Pertinent Vitals/Pain Pain Assessment: Faces Faces Pain Scale: Hurts little more Pain Location: buttock Pain Descriptors / Indicators: Discomfort;Grimacing Pain Intervention(s): Monitored during session    Home Living                      Prior Function            PT Goals (current goals can now be found in the care plan section) Progress towards PT goals: Progressing toward goals    Frequency    Min 2X/week      PT Plan Current plan remains appropriate    Co-evaluation PT/OT/SLP Co-Evaluation/Treatment: Yes Reason for Co-Treatment: For patient/therapist safety PT goals addressed during session: Mobility/safety with mobility OT  goals addressed during session: ADL's and self-care      AM-PAC PT "6 Clicks" Mobility   Outcome Measure  Help needed turning from your back to your side while in a flat bed without using bedrails?: A Little Help needed moving from lying on your back to sitting on the side of a flat bed without using bedrails?: A Little Help needed moving to and from a bed to a chair (including a wheelchair)?: A Lot Help needed standing up from a chair using your arms (e.g., wheelchair or bedside chair)?: A Lot Help needed to walk in hospital room?: Total Help needed climbing 3-5 steps with a railing? : Total 6 Click Score: 12    End of Session   Activity Tolerance: Patient tolerated treatment well Patient left: in bed;with call bell/phone  within reach Nurse Communication: Mobility status;Need for lift equipment PT Visit Diagnosis: Muscle weakness (generalized) (M62.81)     Time: [id number] PT Time Calculation (min) (ACUTE ONLY): 38 min  Charges:  $Therapeutic Activity: 23-37 mins                     Tresa Endo PT Acute Rehabilitation Services Pager [phone] Office [phone]    Robert [name] 05/12/2019, 5:25 PM

## 2019-05-12 NOTE — Progress Notes (Addendum)
Nutrition Follow-up  DOCUMENTATION CODES:   Obesity unspecified  INTERVENTION:   Ensure Enlive po TID, each supplement provides 350 kcal and 20 grams of protein- pt prefers strawberry flavor  Pt receiving Hormel Shake daily with Breakfast which provides 520 kcals and 22 g of protein  Magic cup TID with meals, each supplement provides 290 kcal and 9 grams of protein, automatically on meal trays to optimize nutritional intake.   Encourage PO intake   If intake does not improve with interventions recommend consider short term feeding tube to improve nutrition and aid in healing.   NUTRITION DIAGNOSIS:   Inadequate oral intake related to poor appetite(loss of taste) as evidenced by per patient/family report(PO intake documented as <25%).  Progressing   GOAL:   Patient will meet greater than or equal to 90% of their needs  Progressing   MONITOR:   PO intake, Supplement acceptance, Diet advancement, Skin  ASSESSMENT:   68 yo male admitted with ARDS from COVID-19 pneumonia. PMH includes HLD, anxiety, ischemic cardiomyopathy, CAD, COPD, RA, HTN, DM-on insulin.  8/2 - 8/22 intubated 8/24 pt on room air, passed MBS just ordered Dysphagia 3 with nectar thick liquids, Cortrak removed by RN for MBS  Pt may tx to rehab.   Visited pt who reports that he has no appetite and cannot taste any of his food. We discussed role of a short term feeding tube for nutrition, he would prefer to avoid this and only have it placed if necessary. We discussed importance of nutrition and wound healing. He is willing to try and consume supplements to improve his nutrition. Discussed with MD.   Labs reviewed. Sodium 149 (H) CBG's: [phone]  Medications reviewed and include folic acid, novolog, lantus, MVI, senokot-s, flomax  Weight stable. Mild edema   Diet Order:   Diet Order            DIET DYS 3 Room service appropriate? Yes; Fluid consistency: Nectar Thick  Diet effective now              EDUCATION NEEDS:   Education needs have been addressed  Skin:  Skin Assessment: Skin Integrity Issues: Skin Integrity Issues:: Stage III DTI: sacrum, buttocks Stage III: buttocks Unstageable: buttocks  Last BM:  8/30 x 3  Height:   Ht Readings from Last 1 Encounters:  05/02/19 6' (1.829 m)    Weight:   Wt Readings from Last 1 Encounters:  05/07/19 106.1 kg    Ideal Body Weight:  80.9 kg  BMI:  Body mass index is 31.72 kg/m.  Estimated Nutritional Needs:   Kcal:  2400-2600  Protein:  130-160 grams  Fluid:  2 L  Muse, LDN, CNSC [phone] Pager [phone] After Hours Pager

## 2019-05-12 NOTE — Progress Notes (Signed)
Pt sat up in chair for four hours today (10 AM until 2 PM).  Lift utilized.  Tolerated well.

## 2019-05-13 ENCOUNTER — Inpatient Hospital Stay (HOSPITAL_COMMUNITY): Payer: Medicare HMO

## 2019-05-13 DIAGNOSIS — I484 Atypical atrial flutter: Secondary | ICD-10-CM

## 2019-05-13 LAB — COMPREHENSIVE METABOLIC PANEL
ALT: 73 U/L — ABNORMAL HIGH (ref 0–44)
AST: 36 U/L (ref 15–41)
Albumin: 2.6 g/dL — ABNORMAL LOW (ref 3.5–5.0)
Alkaline Phosphatase: 187 U/L — ABNORMAL HIGH (ref 38–126)
Anion gap: 10 (ref 5–15)
BUN: 24 mg/dL — ABNORMAL HIGH (ref 8–23)
CO2: 29 mmol/L (ref 22–32)
Calcium: 9.3 mg/dL (ref 8.9–10.3)
Chloride: 99 mmol/L (ref 98–111)
Creatinine, Ser: 1.36 mg/dL — ABNORMAL HIGH (ref 0.61–1.24)
GFR calc Af Amer: >60 mL/min (ref >60)
GFR calc non Af Amer: 54 mL/min — ABNORMAL LOW (ref >60)
Glucose, Bld: 356 mg/dL — ABNORMAL HIGH (ref 70–99)
Potassium: 4.6 mmol/L (ref 3.5–5.1)
Sodium: 138 mmol/L (ref 135–145)
Total Bilirubin: 0.4 mg/dL (ref 0.3–1.2)
Total Protein: 7.3 g/dL (ref 6.5–8.1)

## 2019-05-13 LAB — TROPONIN I (HIGH SENSITIVITY): Troponin I (High Sensitivity): 14 ng/L (ref <18)

## 2019-05-13 LAB — BASIC METABOLIC PANEL
Anion gap: 7 (ref 5–15)
BUN: 25 mg/dL — ABNORMAL HIGH (ref 8–23)
CO2: 32 mmol/L (ref 22–32)
Calcium: 8.9 mg/dL (ref 8.9–10.3)
Chloride: 98 mmol/L (ref 98–111)
Creatinine, Ser: 1.32 mg/dL — ABNORMAL HIGH (ref 0.61–1.24)
GFR calc Af Amer: >60 mL/min (ref >60)
GFR calc non Af Amer: 56 mL/min — ABNORMAL LOW (ref >60)
Glucose, Bld: 124 mg/dL — ABNORMAL HIGH (ref 70–99)
Potassium: 3.9 mmol/L (ref 3.5–5.1)
Sodium: 137 mmol/L (ref 135–145)

## 2019-05-13 LAB — MAGNESIUM: Magnesium: 2 mg/dL (ref 1.7–2.4)

## 2019-05-13 LAB — CBC
HCT: 28.5 % — ABNORMAL LOW (ref 39.0–52.0)
Hemoglobin: 9.2 g/dL — ABNORMAL LOW (ref 13.0–17.0)
MCH: 30.6 pg (ref 26.0–34.0)
MCHC: 32.3 g/dL (ref 30.0–36.0)
MCV: 94.7 fL (ref 80.0–100.0)
Platelets: 112 10*3/uL — ABNORMAL LOW (ref 150–400)
RBC: 3.01 MIL/uL — ABNORMAL LOW (ref 4.22–5.81)
RDW: 16.9 % — ABNORMAL HIGH (ref 11.5–15.5)
WBC: 5.1 10*3/uL (ref 4.0–10.5)
nRBC: 0 % (ref 0.0–0.2)

## 2019-05-13 LAB — CBC WITH DIFFERENTIAL/PLATELET
Abs Immature Granulocytes: 0.03 10*3/uL (ref 0.00–0.07)
Basophils Absolute: 0 10*3/uL (ref 0.0–0.1)
Basophils Relative: 0 %
Eosinophils Absolute: 0.5 10*3/uL (ref 0.0–0.5)
Eosinophils Relative: 5 %
HCT: 34.1 % — ABNORMAL LOW (ref 39.0–52.0)
Hemoglobin: 10.7 g/dL — ABNORMAL LOW (ref 13.0–17.0)
Immature Granulocytes: 0 %
Lymphocytes Relative: 16 %
Lymphs Abs: 1.6 10*3/uL (ref 0.7–4.0)
MCH: 30.1 pg (ref 26.0–34.0)
MCHC: 31.4 g/dL (ref 30.0–36.0)
MCV: 95.8 fL (ref 80.0–100.0)
Monocytes Absolute: 0.6 10*3/uL (ref 0.1–1.0)
Monocytes Relative: 6 %
Neutro Abs: 7.4 10*3/uL (ref 1.7–7.7)
Neutrophils Relative %: 73 %
Platelets: 175 10*3/uL (ref 150–400)
RBC: 3.56 MIL/uL — ABNORMAL LOW (ref 4.22–5.81)
RDW: 16.2 % — ABNORMAL HIGH (ref 11.5–15.5)
WBC: 10.2 10*3/uL (ref 4.0–10.5)
nRBC: 0 % (ref 0.0–0.2)

## 2019-05-13 LAB — POCT I-STAT 7, (LYTES, BLD GAS, ICA,H+H)
Acid-Base Excess: 4 mmol/L — ABNORMAL HIGH (ref 0.0–2.0)
Bicarbonate: 31 mmol/L — ABNORMAL HIGH (ref 20.0–28.0)
Calcium, Ion: 1.33 mmol/L (ref 1.15–1.40)
HCT: 29 % — ABNORMAL LOW (ref 39.0–52.0)
Hemoglobin: 9.9 g/dL — ABNORMAL LOW (ref 13.0–17.0)
O2 Saturation: 79 %
Patient temperature: 98.3
Potassium: 4.4 mmol/L (ref 3.5–5.1)
Sodium: 136 mmol/L (ref 135–145)
TCO2: 33 mmol/L — ABNORMAL HIGH (ref 22–32)
pCO2 arterial: 61.7 mmHg — ABNORMAL HIGH (ref 32.0–48.0)
pH, Arterial: 7.308 — ABNORMAL LOW (ref 7.350–7.450)
pO2, Arterial: 48 mmHg — ABNORMAL LOW (ref 83.0–108.0)

## 2019-05-13 LAB — GLUCOSE, CAPILLARY
Glucose-Capillary: 126 mg/dL — ABNORMAL HIGH (ref 70–99)
Glucose-Capillary: 101 mg/dL — ABNORMAL HIGH (ref 70–99)
Glucose-Capillary: 172 mg/dL — ABNORMAL HIGH (ref 70–99)
Glucose-Capillary: 305 mg/dL — ABNORMAL HIGH (ref 70–99)

## 2019-05-13 LAB — NOVEL CORONAVIRUS, NAA (HOSP ORDER, SEND-OUT TO REF LAB; TAT 18-24 HRS): SARS-CoV-2, NAA: NOT DETECTED

## 2019-05-13 LAB — D-DIMER, QUANTITATIVE: D-Dimer, Quant: 3.55 ug/mL-FEU — ABNORMAL HIGH (ref 0.00–0.50)

## 2019-05-13 LAB — LACTIC ACID, PLASMA: Lactic Acid, Venous: 1.5 mmol/L (ref 0.5–1.9)

## 2019-05-13 MED ORDER — LORAZEPAM 2 MG/ML IJ SOLN: 1.0000 mg | Freq: Once | INTRAMUSCULAR | Status: DC

## 2019-05-13 MED ORDER — POLYETHYLENE GLYCOL 3350 17 G PO PACK: 34.0000 g | PACK | Freq: Once | ORAL | Status: DC

## 2019-05-13 MED ORDER — BISACODYL 10 MG RE SUPP: 10.0000 mg | Freq: Once | RECTAL | Status: DC

## 2019-05-13 MED ORDER — MAGNESIUM SULFATE 2 GM/50ML IV SOLN
2.0000 g | Freq: Once | INTRAVENOUS | Status: AC
  Administered 2019-05-14: 2 g via INTRAVENOUS
  Filled 2019-05-13: qty 50

## 2019-05-13 NOTE — Progress Notes (Signed)
Erica Dodoo CN and Dr Olevia Bowens here and in room assisting with pt.  Pt condition is slightly improving. Pt is now on 15 L/m HFNC and   15 L non rebreather,  Remains diaphoregic and mouth breathing in the 30's.  New orders being given and carried out. Waiting for ICU bed at this point.

## 2019-05-13 NOTE — Progress Notes (Signed)
RT called to assess patient earlier in this shift. RT unavailable due to code in ICU.  RN requested splitter for [place]/NRB.  Upon entering room patient is on 15L [place]. Humidity added by RT.  NRB on patient however bag is not inflated.  SP02 maintaing 90-92%.  NRB removed. MD at bedside. Patient awaiting transfer to ICU.  ABG to be obtained.

## 2019-05-13 NOTE — Progress Notes (Signed)
Night shift progressive unit coverage/ICU transfer note.  The patient was seen after the staff found him trying to get out of bed.  He was anxious, confused, diaphoretic, tachycardic and very dyspneic.  His O2 sat was about 70% on 2 LPM of oxygen via [place].  He had fecal incontinence.  This was increased to 6 LPM, which increase the O2 sat to the 80s.  His vital signs had a moment were BP 124/68, HR 132 R 35, SpO2 80-84.  The patient still remained confused.  He was subsequently put on HF [place]/NRB oxygen and transferred back to the ICU for closer monitoring.  His vitals have improved in the ICU.    Pulse 85  82  respirations 22   BP 135/77  -  100 %  -  - KH   General: Somnolent, still mildly confused. HEENT: Normocephalic, [place] Neck: Supple, no JVD. Lungs: Decreased breath sounds throughout with scattered crackles Cardiovascular: S1-S2, RRR Abdomen: Nondistended, obese, soft. Extremities: No edema, clubbing or cyanosis. Neuro: Somnolent, but grossly nonfocal.  EKG Showing new ST segment changes on lateral leads  Specimen Type: Blood    D-Dimer, Quant 3.55High  ug/mL-FEU  Troponin I (High Sensitivity) [id number]   Collected: 05/13/19 2220   Updated: 05/13/19 2322    Troponin I (High Sensitivity) 14 ng/L  Lactic acid, plasma [id number]   Collected: 05/13/19 2220   Updated: 05/13/19 2317    Lactic Acid, Venous 1.5 mmol/L  Comprehensive metabolic panel [id number] (Abnormal)   Collected: 05/13/19 2220   Updated: 05/13/19 2316   Specimen Type: Blood    Sodium 138 mmol/L   Potassium 4.6 mmol/L   Chloride 99 mmol/L   CO2 29 mmol/L   Glucose, Bld 356High  mg/dL   BUN 24High  mg/dL   Creatinine, Ser 1.36High  mg/dL   Calcium 9.3 mg/dL   Total Protein 7.3 g/dL   Albumin 2.6Low  g/dL   AST 36 U/L   ALT 73High  U/L   Alkaline Phosphatase 187High  U/L   Total Bilirubin 0.4 mg/dL   GFR calc non Af Amer 54Low  mL/min   GFR calc Af Amer >60 mL/min   Anion gap 10  Magnesium [id number]    Collected: 05/13/19 2220   Updated: 05/13/19 2316   Specimen Type: Blood    Magnesium 2.0 mg/dL  I-STAT 7, (LYTES, BLD GAS, ICA, H+H) [id number] (Abnormal)   Collected: 05/13/19 2305   Updated: 05/13/19 2308    pH, Arterial 7.308Low    pCO2 arterial 61.7High  mmHg   pO2, Arterial 48.0Low  mmHg   Bicarbonate 31.0High  mmol/L   TCO2 33High  mmol/L   O2 Saturation 79.0 %   Acid-Base Excess 4.0High  mmol/L   Sodium 136 mmol/L   Potassium 4.4 mmol/L   Calcium, Ion 1.33 mmol/L   HCT 29.0Low  %   Hemoglobin 9.9Low  g/dL   Patient temperature 98.3 F   Collection site RADIAL, ALLEN'S TEST ACCEPTABLE   Drawn by RT   Sample type ARTERIAL  CBC with Differential/Platelet [id number] (Abnormal)   Collected: 05/13/19 2220   Updated: 05/13/19 2248   Specimen Type: Blood    WBC 10.2 K/uL   RBC 3.56Low  MIL/uL   Hemoglobin 10.7Low  g/dL   HCT 34.1Low  %   MCV 95.8 fL   MCH 30.1 pg   MCHC 31.4 g/dL   RDW 16.2High  %   Platelets 175 K/uL   nRBC 0.0 %   Neutrophils Relative % 73 %  Neutro Abs 7.4 K/uL   Lymphocytes Relative 16 %   Lymphs Abs 1.6 K/uL   Monocytes Relative 6 %   Monocytes Absolute 0.6 K/uL   Eosinophils Relative 5 %   Eosinophils Absolute 0.5 K/uL   Basophils Relative 0 %   Basophils Absolute 0.0 K/uL   Immature Granulocytes 0 %   Abs Immature Granulocytes 0.03 K/uL    PORTABLE CHEST 1 VIEW  COMPARISON:  04/25/2019  FINDINGS: Unchanged heart size with mild cardiomegaly. Unchanged mediastinal contours. Aortic atherosclerosis and coronary stent visualized. Opacification of the lower right hemithorax may represent combination of pleural fluid and airspace disease. Right costophrenic angle not included in the field of view. There is a left pleural effusion, increased from prior. Pulmonary and interstitial opacities edema has progressed.  IMPRESSION: 1. Opacification of the lower right hemithorax may represent combination of pleural fluid and airspace  disease, pneumonia versus aspiration. 2. Increased left pleural effusion since prior exam. Progressive interstitial opacities from prior likely combination of pulmonary edema and atypical pneumonia.   Electronically Signed   By: Keith Rake M.D.   On: 05/13/2019 23:22  Acute respiratory failure Aspiration vs PE vs cardiac event. Transferred to the ICU for closer monitoring.  DM type II, RA, Hyperlipidemia, hypertension, CAD, COPD, etc. Continue current treatment.  Tennis Must, MD  This document was prepared using Dragon voice recognition software and may contain some unintended transcription errors.  About 55 minutes of critical care time were spent during the process of these emergent event.

## 2019-05-13 NOTE — Progress Notes (Signed)
Dr Jeannetta Nap paged and given changes in pt condition. [name]  Charge nurse called.  Float nurse called also for help.  Pt incontinent of stool and no other changes noted, still diaphoregic, tachypnic, tachycardic, and anxious.  BP 124/68, HR 132 R 35, SpO2 80-84. Pt now sitting in high fowlers and [place] changed out for High Flow Coker at 10 L/m.  SpO2 probe changed out and sats are still in the 80's. Float RN brought 2200 meds to be given. Oral suspension metroprol, 11 Units novolog insulin given as per sliding scale for FSBS 305.

## 2019-05-13 NOTE — Progress Notes (Signed)
PROGRESS NOTE  Robert [name] [id number] DOB: 1951/07/26 DOA: 04/11/2019 PCP: Redmond School, MD   LOS: 31 days   Brief Narrative / Interim history:  68 year old male with history of COPD, CAD, diabetes mellitus, ischemic cardiomyopathy, RA, presented to the hospital and was admitted on 04/11/2019 with shortness of breath, he was found to be significantly hypoxic, tested positive for COVID-19, was intubated and transferred to [facility] for further management.  Hospital course complicated by persistent encephalopathy and difficulty weaning due to tachypnea and agitation, he failed extubation once on 8/2.  Hospital course was also complicated by MRSA pneumonia with septic shock at the beginning of August status post completed treatment. Patient eventually was eventually successfully extubated on 8/22 and transferred to the floor on 8/24  Significant events: 8/1 admission to [facility] ICU 8/2 extubated 8/3 early AM agitated, placed on BIPAP, required re-intubation 8/4 started on steroids for COPD exacerbation 8/8 septic shock requiring pressor support 8/9 off pressors, respiratory cultures +s.aureus 8/10 persistent encephalopathy off sedation, CT head ordered 8/11 encephalopathy improved 8/12 difficulty weaning due to tachypnea, ETT exchanged due to clot burden 8/13 PICC line inserted and subclavian central line removed 8/14 Antibiotics coverage broaden due to persistent fever and resp distress, pan cultured  8/19 more alert, tolerating pressure support 8/20 failed voiding trial, Foley needed to be reinserted 8/22 successfully extubated  8/24 transfer to the floor  Subjective: -Patient reports one episode of vomiting earlier today, good bowel movement, no chest pain, no shortness of breath.  Assessment & Plan: Principal Problem:   Pneumonia due to COVID-19 virus Active Problems:   Rheumatoid arthritis ([place])   Hyperlipidemia   COPD (chronic obstructive pulmonary disease) (HCC)   Controlled  type 2 diabetes mellitus with diabetic peripheral angiopathy without gangrene, with long-term current use of insulin (HCC)   Essential hypertension   Ischemic cardiomyopathy   Coronary artery disease   Dyslipidemia (high LDL; low HDL)   Acute respiratory failure with hypoxemia (HCC)   COVID-19 virus detected   Acute respiratory distress syndrome (ARDS) due to COVID-19 virus   Atrial fibrillation with RVR (HCC)   COPD (chronic obstructive pulmonary disease) with emphysema (HCC)   AKI (acute kidney injury) ([place])   Diabetes mellitus type 2, uncontrolled, with complications (HCC)   Pressure injury of skin   Staphylococcal pneumonia ([place])   HCAP (healthcare-associated pneumonia)   Acute Hypoxic Respiratory Failure due to Covid-19 Viral Illness, COPD exacerbation -Extubated on 8/22, remains stable from respiratory standpoint, this morning he remains on 2 L nasal cannula, discussed with staff, they will try to wean down, patient reports he had history of smoking, 1.5 pack/day until admission, might be having some underlining COPD, and may still need some oxygen requirement at baseline. -Continue his inhalers for COPD, no longer wheezing  Fever: Temp (24hrs), Avg:98.3 F (36.8 C), Min:98 F (36.7 C), Max:98.6 F (37 C)  Antibiotics:  Cefepime 7/31 >8/4  Flagyl 7/31  Remdesivir 8/1 >8/5  Actemra 8/1  Decadron/solumedrol 8/1 >8/8  Zosyn 8/8>8/9 Azithro 8/7>8/9 Cefepime 8/14 >8/19 Vanc 7/31, 8/8>8/22  COVID-19 interventions: Remdesivir: 8/1 >> 8/5 Steroids: decadron / solumedrol 8/1 >> 8/5 Actemra: 8/1 Convalescent Plasma: none  Vitamin C and Zinc: yes   COVID-19 Labs  No results for input(s): DDIMER, FERRITIN, LDH, CRP in the last 72 hours.  Lab Results  Component Value Date   SARSCOV2NAA POSITIVE (A) 05/07/2019   SARSCOV2NAA NOT DETECTED 05/06/2019   SARSCOV2NAA POSITIVE (A) 04/11/2019   SARSCOV2NAA Detected (A) 04/11/2019  Active Problems MRSA HCAP /septic  shock -Off pressors now -Completed treatment with vancomycin for 3 weeks, last day was 8/22 -Respiratory status improving, extubated on 8/22 AM, currently stable on the floor  Paroxysmal A. fib with RVR -Currently in sinus, on metoprolol 25mg  3 times daily -He was on IV heparin but no anticoagulation is on hold due to bleeding with ETT clotting as well as bleeding from the subclavian line -No further evidence of A. fib on telemetry, his A. fib episode most likely related to his acute illness secondary to COVID-19 infection and respiratory failure .  So far remains in sinus rhythm, with no A. fib recurrence,   Will need anticoagulation if A. fib recurs.  Acute on chronic diastolic CHF, essential hypertension, history of CAD -History of PCI in 2018 with in-stent restenosis, -He denies any chest pain, continue with aspirin, Plavix. -Creatinine 1.3 today, will continue to hold lasix, he appears to be euvolemic  Deconditioning/ICU induced muscle weakness -Aggressive PT/OT -On dysphagia 3 with thick liquids per speech, continue to be followed by SLP,  Hypernatremia -Resolved, sodium now normal  Acute kidney injury -Creatinine peaked as high as 2.39 on 04/24/2019, possibly multifactorial in the setting of diuresis and use of vancomycin -He was also found to have retention requiring Foley catheter placement. -At 1.3 today, will hold Lasix  Acute urinary retention. -In the setting of ICU stay. Failed voiding trial on 8/20 and Foley had to be placed back.  He failed another voiding attempt yesterday, Foley catheter inserted earlier today, continue with Flomax.  ETT clotting/bleeding from the subclavian line -Patient was on therapeutic heparin, ET tube was clotted and exchange on 04/23/2019.  Currently on heparin prophylaxis -Hemoglobin stable  Thrombocytopenia -Chronic, stable  Type 2 diabetes mellitus, uncontrolled with hyperglycemia with vascular disease and neuropathy at baseline -CBGs  fair, continue to monitor  CBG (last 3)  Recent Labs    05/13/19 0421 05/13/19 0832 05/13/19 1213  GLUCAP 126* 101* 172*   RA -He is on Actemra and methotrexate at home, currently on hold  Pressure ulcer of medial sacrum bilateral buttocks -Wound care service consulted, will follow recommendations Pressure Injury 04/17/19 Sacrum Medial Deep Tissue Injury - Purple or maroon localized area of discolored intact skin or blood-filled blister due to damage of underlying soft tissue from pressure and/or shear. dark purple with some skin sloughing on lowe (Active)  04/17/19 0730  Location: Sacrum  Location Orientation: Medial  Staging: Deep Tissue Injury - Purple or maroon localized area of discolored intact skin or blood-filled blister due to damage of underlying soft tissue from pressure and/or shear.  Wound Description (Comments): dark purple with some skin sloughing on lower sacral area  Present on Admission: No     Pressure Injury 04/17/19 Buttocks Right;Lower Unstageable - Full thickness tissue loss in which the base of the ulcer is covered by slough (yellow, tan, gray, green or brown) and/or eschar (tan, brown or black) in the wound bed. (Active)  04/17/19 0730  Location: Buttocks  Location Orientation: Right;Lower  Staging: Unstageable - Full thickness tissue loss in which the base of the ulcer is covered by slough (yellow, tan, gray, green or brown) and/or eschar (tan, brown or black) in the wound bed.  Wound Description (Comments):   Present on Admission: No     Pressure Injury 04/17/19 Buttocks Left;Lower WOC updated to Stage 3 on 05/06/19 at the time of assessment (Active)  04/17/19 0730  Location: Buttocks  Location Orientation: Left;Lower  Staging:  Wound Description (Comments): WOC updated to Stage 3 on 05/06/19 at the time of assessment  Present on Admission: No     Scheduled Meds:  albuterol  2 puff Inhalation Q6H   aspirin  81 mg Oral Daily   bethanechol  10 mg  Oral BID   bisacodyl  10 mg Rectal Once   chlorhexidine  15 mL Mouth Rinse BID   Chlorhexidine Gluconate Cloth  6 each Topical Daily   clopidogrel  75 mg Oral Daily   collagenase   Topical Daily   enoxaparin (LOVENOX) injection  40 mg Subcutaneous Q24H   feeding supplement (ENSURE ENLIVE)  237 mL Oral TID BM   folic acid  1 mg Oral Daily   gabapentin  100 mg Oral Q8H   insulin aspart  0-15 Units Subcutaneous Q4H   insulin glargine  15 Units Subcutaneous Daily   mouth rinse  15 mL Mouth Rinse q12n4p   metoprolol tartrate  25 mg Oral TID   mometasone-formoterol  2 puff Inhalation BID   multivitamin with minerals  1 tablet Oral Daily   senna-docusate  3 tablet Oral BID   sodium chloride flush  10-40 mL Intracatheter Q12H   tamsulosin  0.8 mg Oral QPC supper   Continuous Infusions:  sodium chloride Stopped (05/05/19 1136)   PRN Meds:.sodium chloride, acetaminophen, dextrose, hydrALAZINE, Resource ThickenUp Clear, sodium chloride flush, Thrombi-Pad   DVT prophylaxis: Lovenox Code Status: Full code Family Communication: Discussed with patient, discussed with daughter via phone 8/29 Disposition Plan: Need placement, likely will benefit from CIR, but he is still testing positive for COVID-19, will recheck another test today.  Consultants:   PCCM  Micro data: 7/31 blood >NGTD 7/31 sars-cov-2 >positive 8/1 Resp CX > Normal flora 8/7 Resp CX >MRSA 8/7 BCX > NGTD 8/8 UCX > NGTD 8/15 Resp culture>  MRSA    Objective: Vitals:   05/13/19 0419 05/13/19 0700 05/13/19 1200 05/13/19 1300  BP: 122/70 133/79 (!) 146/77 130/63  Pulse: 79 80 (!) 58 81  Resp:   (!) 21   Temp:  98 F (36.7 C)  98.5 F (36.9 C)  TempSrc:  Oral  Oral  SpO2: 94% 91% 99% 98%  Weight:      Height:        Intake/Output Summary (Last 24 hours) at 05/13/2019 1423 Last data filed at 05/13/2019 1300 Gross per 24 hour  Intake 650 ml  Output 500 ml  Net 150 ml   Filed Weights    05/05/19 0300 05/06/19 0500 05/07/19 0235  Weight: 104.2 kg 111 kg 106.1 kg    Examination:  Awake Alert, Oriented X 3, No new F.N deficits, Normal affect Symmetrical Chest wall movement, Good air movement bilaterally, CTAB RRR,No Gallops,Rubs or new Murmurs, No Parasternal Heave +ve B.Sounds, Abd Soft, No tenderness, No rebound - guarding or rigidity. No Cyanosis, Clubbing or edema, No new Rash or bruise          Data Reviewed: I have independently reviewed following labs and imaging studies    CBC: Recent Labs  Lab 05/23/19 1208 05/09/19 0058 05/10/19 0650 05/11/19 0123 05/13/19 0206  WBC 5.2 4.8 6.3 5.3 5.1  HGB 8.9* 8.9* 8.6* 8.8* 9.2*  HCT 27.7* 27.9* 27.2* 27.5* 28.5*  MCV 94.2 93.6 94.4 94.8 94.7  PLT 96* 97* 103* 94* [id number]*   Basic Metabolic Panel: Recent Labs  Lab 05/07/19 0130 05/09/19 0058 05/10/19 0650 05/11/19 0123 05/13/19 0206  NA 141 141 141 138 137  K 4.0  4.1 4.1 3.8 3.9  CL 106 105 103 99 98  CO2 27 27 30 31  32  GLUCOSE 175* 184* 127* 173* 124*  BUN 51* 36* 31* 34* 25*  CREATININE 1.25* 1.38* 1.31* 1.29* 1.32*  CALCIUM 8.7* 9.0 8.9 8.8* 8.9   GFR: Estimated Creatinine Clearance: 69.3 mL/min (A) (by C-G formula based on SCr of 1.32 mg/dL (H)). Liver Function Tests: Recent Labs  Lab 05/07/19 0130 05/09/19 0058 05/10/19 0650 05/11/19 0123  AST 31 26 28  33  ALT 57* 53* 52* 58*  ALKPHOS 98 111 112 130*  BILITOT 0.6 0.2* 0.4 0.4  PROT 5.9* 6.0* 6.0* 6.3*  ALBUMIN 2.3* 2.3* 2.2* 2.3*   No results for input(s): LIPASE, AMYLASE in the last 168 hours. No results for input(s): AMMONIA in the last 168 hours. Coagulation Profile: No results for input(s): INR, PROTIME in the last 168 hours. Cardiac Enzymes: No results for input(s): CKTOTAL, CKMB, CKMBINDEX, TROPONINI in the last 168 hours. BNP (last 3 results) No results for input(s): PROBNP in the last 8760 hours. HbA1C: No results for input(s): HGBA1C in the last 72 hours. CBG: Recent  Labs  Lab 05/12/19 1552 05/12/19 2335 05/13/19 0421 05/13/19 0832 05/13/19 1213  GLUCAP 218* 177* 126* 101* 172*   Lipid Profile: No results for input(s): CHOL, HDL, LDLCALC, TRIG, CHOLHDL, LDLDIRECT in the last 72 hours. Thyroid Function Tests: No results for input(s): TSH, T4TOTAL, FREET4, T3FREE, THYROIDAB in the last 72 hours. Anemia Panel: No results for input(s): VITAMINB12, FOLATE, FERRITIN, TIBC, IRON, RETICCTPCT in the last 72 hours. Urine analysis:    Component Value Date/Time   COLORURINE YELLOW 04/25/2019 0826   APPEARANCEUR HAZY (A) 04/25/2019 0826   LABSPEC 1.015 04/25/2019 0826   PHURINE 5.0 04/25/2019 0826   GLUCOSEU 150 (A) 04/25/2019 0826   HGBUR LARGE (A) 04/25/2019 0826   BILIRUBINUR NEGATIVE 04/25/2019 0826   KETONESUR NEGATIVE 04/25/2019 0826   PROTEINUR 30 (A) 04/25/2019 0826   UROBILINOGEN 0.2 12/22/2011 2130   NITRITE NEGATIVE 04/25/2019 0826   LEUKOCYTESUR NEGATIVE 04/25/2019 0826   Sepsis Labs: Invalid input(s): PROCALCITONIN, LACTICIDVEN  Recent Results (from the past 240 hour(s))  Novel Coronavirus, NAA (hospital order; send-out to ref lab)     Status: None   Collection Time: 05/06/19 10:16 AM   Specimen: Nasopharyngeal Swab; Respiratory  Result Value Ref Range Status   SARS-CoV-2, NAA NOT DETECTED NOT DETECTED Final    Comment: (NOTE) This test was developed and its performance characteristics determined by [employer]. This test has not been FDA cleared or approved. This test has been authorized by FDA under an Emergency Use Authorization (EUA). This test is only authorized for the duration of time the declaration that circumstances exist justifying the authorization of the emergency use of in vitro diagnostic tests for detection of SARS-CoV-2 virus and/or diagnosis of COVID-19 infection under section 564(b)(1) of the Act, 21 U.S.C. [id number]), unless the authorization is terminated or revoked sooner. When diagnostic  testing is negative, the possibility of a false negative result should be considered in the context of a patient's recent exposures and the presence of clinical signs and symptoms consistent with COVID-19. An individual without symptoms of COVID-19 and who is not shedding SARS-CoV-2 virus would expect to have a negative (not detected) result in this assay. Performed  At: [facility] [place] [employer], [place] [id number] Rush Farmer MD [id number]    [place]  Final    Comment: Performed at [facility], [address] [place], [place] [address]  SARS Coronavirus 2 [facility] order, Performed in [facility] hospital lab)     Status: Abnormal   Collection Time: 05/07/19  5:23 PM  Result Value Ref Range Status   SARS Coronavirus 2 POSITIVE (A) NEGATIVE Final    Comment: CRITICAL RESULT CALLED TO, READ BACK BY AND VERIFIED WITH: RN T SCEGEN AT 2143 05/07/19 CRUICKSHANK A (NOTE) If result is NEGATIVE SARS-CoV-2 target nucleic acids are NOT DETECTED. The SARS-CoV-2 RNA is generally detectable in upper and lower  respiratory specimens during the acute phase of infection. The lowest  concentration of SARS-CoV-2 viral copies this assay can detect is 250  copies / mL. A negative result does not preclude SARS-CoV-2 infection  and should not be used as the sole basis for treatment or other  patient management decisions.  A negative result may occur with  improper specimen collection / handling, submission of specimen other  than nasopharyngeal swab, presence of viral mutation(s) within the  areas targeted by this assay, and inadequate number of viral copies  (<250 copies / mL). A negative result must be combined with clinical  observations, patient history, and epidemiological information. If result is POSITIVE SARS-CoV-2 target nucleic acids a re DETECTED. The SARS-CoV-2 RNA is generally detectable in upper and lower  respiratory  specimens during the acute phase of infection.  Positive  results are indicative of active infection with SARS-CoV-2.  Clinical  correlation with patient history and other diagnostic information is  necessary to determine patient infection status.  Positive results do  not rule out bacterial infection or co-infection with other viruses. If result is PRESUMPTIVE POSTIVE SARS-CoV-2 nucleic acids MAY BE PRESENT.   A presumptive positive result was obtained on the submitted specimen  and confirmed on repeat testing.  While 2019 novel coronavirus  (SARS-CoV-2) nucleic acids may be present in the submitted sample  additional confirmatory testing may be necessary for epidemiological  and / or clinical management purposes  to differentiate between  SARS-CoV-2 and other Sarbecovirus currently known to infect humans.  If clinically indicated additional testing with an alternate test  methodolo gy [phone]) is advised. The SARS-CoV-2 RNA is generally  detectable in upper and lower respiratory specimens during the acute  phase of infection. The expected result is Negative. Fact Sheet for Patients:  StrictlyIdeas.no Fact Sheet for Healthcare Providers: BankingDealers.co.za This test is not yet approved or cleared by the [place] FDA and has been authorized for detection and/or diagnosis of SARS-CoV-2 by FDA under an Emergency Use Authorization (EUA).  This EUA will remain in effect (meaning this test can be used) for the duration of the COVID-19 declaration under Section 564(b)(1) of the Act, 21 U.S.C. section 360bbb-3(b)(1), unless the authorization is terminated or revoked sooner. Performed at [facility], [place] [address], [place], [place] [address]      Radiology Studies: No results found.   Phillips Climes MD [facility]  Contact via  [email or web address]  [place] P: [phone] F: [phone]

## 2019-05-13 NOTE — Progress Notes (Signed)
Walked into pt room as he is trying to crawl out of bed. Pt is very anxious, diaphoregic, gasping for air, tachycardic PSO2 low 70's on O2  at 2L/min via [place]. Increased to 6 L/min [place].  Pt still mouth breathing and sating in low 80's now.  Resp 38.

## 2019-05-13 NOTE — Plan of Care (Signed)
Pts daughter was called and update. No questions or concerns at this time.

## 2019-05-14 ENCOUNTER — Other Ambulatory Visit: Payer: Self-pay

## 2019-05-14 ENCOUNTER — Inpatient Hospital Stay (HOSPITAL_COMMUNITY): Payer: Medicare HMO

## 2019-05-14 DIAGNOSIS — I361 Nonrheumatic tricuspid (valve) insufficiency: Secondary | ICD-10-CM

## 2019-05-14 DIAGNOSIS — I5033 Acute on chronic diastolic (congestive) heart failure: Secondary | ICD-10-CM

## 2019-05-14 DIAGNOSIS — I340 Nonrheumatic mitral (valve) insufficiency: Secondary | ICD-10-CM

## 2019-05-14 DIAGNOSIS — R7989 Other specified abnormal findings of blood chemistry: Secondary | ICD-10-CM

## 2019-05-14 LAB — CBC
HCT: 27.7 % — ABNORMAL LOW (ref 39.0–52.0)
Hemoglobin: 8.9 g/dL — ABNORMAL LOW (ref 13.0–17.0)
MCH: 30.5 pg (ref 26.0–34.0)
MCHC: 32.1 g/dL (ref 30.0–36.0)
MCV: 94.9 fL (ref 80.0–100.0)
Platelets: 127 10*3/uL — ABNORMAL LOW (ref 150–400)
RBC: 2.92 MIL/uL — ABNORMAL LOW (ref 4.22–5.81)
RDW: 16.2 % — ABNORMAL HIGH (ref 11.5–15.5)
WBC: 6.7 10*3/uL (ref 4.0–10.5)
nRBC: 0 % (ref 0.0–0.2)

## 2019-05-14 LAB — GLUCOSE, CAPILLARY
Glucose-Capillary: 348 mg/dL — ABNORMAL HIGH (ref 70–99)
Glucose-Capillary: 268 mg/dL — ABNORMAL HIGH (ref 70–99)
Glucose-Capillary: 141 mg/dL — ABNORMAL HIGH (ref 70–99)
Glucose-Capillary: 128 mg/dL — ABNORMAL HIGH (ref 70–99)
Glucose-Capillary: 114 mg/dL — ABNORMAL HIGH (ref 70–99)
Glucose-Capillary: 221 mg/dL — ABNORMAL HIGH (ref 70–99)

## 2019-05-14 LAB — COMPREHENSIVE METABOLIC PANEL
ALT: 64 U/L — ABNORMAL HIGH (ref 0–44)
AST: 31 U/L (ref 15–41)
Albumin: 2.4 g/dL — ABNORMAL LOW (ref 3.5–5.0)
Alkaline Phosphatase: 156 U/L — ABNORMAL HIGH (ref 38–126)
Anion gap: 9 (ref 5–15)
BUN: 26 mg/dL — ABNORMAL HIGH (ref 8–23)
CO2: 31 mmol/L (ref 22–32)
Calcium: 9.3 mg/dL (ref 8.9–10.3)
Chloride: 97 mmol/L — ABNORMAL LOW (ref 98–111)
Creatinine, Ser: 1.3 mg/dL — ABNORMAL HIGH (ref 0.61–1.24)
GFR calc Af Amer: >60 mL/min (ref >60)
GFR calc non Af Amer: 57 mL/min — ABNORMAL LOW (ref >60)
Glucose, Bld: 154 mg/dL — ABNORMAL HIGH (ref 70–99)
Potassium: 4.3 mmol/L (ref 3.5–5.1)
Sodium: 137 mmol/L (ref 135–145)
Total Bilirubin: 0.1 mg/dL — ABNORMAL LOW (ref 0.3–1.2)
Total Protein: 6.6 g/dL (ref 6.5–8.1)

## 2019-05-14 LAB — BRAIN NATRIURETIC PEPTIDE: B Natriuretic Peptide: 383.8 pg/mL — ABNORMAL HIGH (ref 0.0–100.0)

## 2019-05-14 LAB — MRSA PCR SCREENING: MRSA by PCR: POSITIVE — AB

## 2019-05-14 LAB — ECHOCARDIOGRAM LIMITED
Height: 72 in
Weight: 3742.53 oz

## 2019-05-14 LAB — PROCALCITONIN: Procalcitonin: 0.49 ng/mL

## 2019-05-14 LAB — TROPONIN I (HIGH SENSITIVITY)
Troponin I (High Sensitivity): 221 ng/L (ref <18)
Troponin I (High Sensitivity): 1269 ng/L (ref <18)
Troponin I (High Sensitivity): 675 ng/L (ref <18)
Troponin I (High Sensitivity): 480 ng/L (ref <18)

## 2019-05-14 LAB — HEPARIN LEVEL (UNFRACTIONATED): Heparin Unfractionated: <0.1 IU/mL — ABNORMAL LOW (ref 0.30–0.70)

## 2019-05-14 MED ORDER — HEPARIN BOLUS VIA INFUSION
4000.0000 [IU] | Freq: Once | INTRAVENOUS | Status: AC
  Administered 2019-05-14: 4000 [IU] via INTRAVENOUS
  Filled 2019-05-14: qty 4000

## 2019-05-14 MED ORDER — FOLIC ACID 5 MG/ML IJ SOLN
1.0000 mg | Freq: Every day | INTRAMUSCULAR | Status: DC
  Administered 2019-05-14 – 2019-05-16 (×3): 1 mg via INTRAVENOUS
  Filled 2019-05-14 (×4): qty 0.2

## 2019-05-14 MED ORDER — SODIUM CHLORIDE 0.9 % IV SOLN
3.0000 g | Freq: Four times a day (QID) | INTRAVENOUS | Status: AC
  Administered 2019-05-14 – 2019-05-18 (×19): 3 g via INTRAVENOUS
  Filled 2019-05-14 (×4): qty 8
  Filled 2019-05-14 (×2): qty 3
  Filled 2019-05-14 (×3): qty 8
  Filled 2019-05-14 (×5): qty 3
  Filled 2019-05-14: qty 8
  Filled 2019-05-14: qty 3
  Filled 2019-05-14 (×3): qty 8
  Filled 2019-05-14: qty 3

## 2019-05-14 MED ORDER — ATORVASTATIN CALCIUM 80 MG PO TABS
80.0000 mg | ORAL_TABLET | Freq: Every day | ORAL | Status: DC
  Administered 2019-05-15 – 2019-05-22 (×8): 80 mg via ORAL
  Filled 2019-05-14: qty 1
  Filled 2019-05-14: qty 2
  Filled 2019-05-14: qty 1
  Filled 2019-05-14 (×5): qty 2

## 2019-05-14 MED ORDER — FUROSEMIDE 10 MG/ML IJ SOLN
80.0000 mg | Freq: Once | INTRAMUSCULAR | Status: DC
  Filled 2019-05-14: qty 8

## 2019-05-14 MED ORDER — METOPROLOL TARTRATE 25 MG/10 ML ORAL SUSPENSION
50.0000 mg | Freq: Two times a day (BID) | ORAL | Status: DC
  Administered 2019-05-14 – 2019-05-15 (×3): 50 mg via ORAL
  Filled 2019-05-14 (×4): qty 20

## 2019-05-14 MED ORDER — ASPIRIN 81 MG PO CHEW
324.0000 mg | CHEWABLE_TABLET | Freq: Once | ORAL | Status: AC
  Administered 2019-05-14: 324 mg via ORAL
  Filled 2019-05-14: qty 4

## 2019-05-14 MED ORDER — ASPIRIN 81 MG PO CHEW
81.0000 mg | CHEWABLE_TABLET | Freq: Every day | ORAL | Status: DC
  Administered 2019-05-15 – 2019-05-21 (×6): 81 mg via ORAL
  Filled 2019-05-14 (×7): qty 1

## 2019-05-14 MED ORDER — ORAL CARE MOUTH RINSE
15.0000 mL | Freq: Two times a day (BID) | OROMUCOSAL | Status: DC
  Administered 2019-05-14 – 2019-05-21 (×14): 15 mL via OROMUCOSAL

## 2019-05-14 MED ORDER — HEPARIN (PORCINE) 25000 UT/250ML-% IV SOLN
1550.0000 [IU]/h | INTRAVENOUS | Status: DC
  Administered 2019-05-14: 09:00:00 1250 [IU]/h via INTRAVENOUS
  Administered 2019-05-15: 01:00:00 1550 [IU]/h via INTRAVENOUS
  Filled 2019-05-14 (×2): qty 250

## 2019-05-14 MED ORDER — METOPROLOL TARTRATE 5 MG/5ML IV SOLN
2.5000 mg | Freq: Once | INTRAVENOUS | Status: AC
  Administered 2019-05-14: 11:00:00 2.5 mg via INTRAVENOUS
  Filled 2019-05-14: qty 5

## 2019-05-14 MED ORDER — FUROSEMIDE 10 MG/ML IJ SOLN
80.0000 mg | Freq: Once | INTRAMUSCULAR | Status: AC
  Administered 2019-05-14: 08:00:00 80 mg via INTRAVENOUS
  Filled 2019-05-14: qty 8

## 2019-05-14 MED ORDER — FUROSEMIDE 10 MG/ML IJ SOLN
80.0000 mg | Freq: Two times a day (BID) | INTRAMUSCULAR | Status: DC
  Filled 2019-05-14: qty 8

## 2019-05-14 NOTE — Progress Notes (Addendum)
NAME:  Robert [name], MRN:  [id number], DOB:  06-08-1951, LOS: 60 ADMISSION DATE:  04/11/2019, CONSULTATION DATE:  8/1 REFERRING MD:  Roderic Palau, CHIEF COMPLAINT:  dyspnea   Brief History   68 year old male admitted on 8/1 in the setting of ARDS from COVID-19 pneumonia. Initially presented to [facility] on 7/31 and required intubation. Post-procedure in the ED, patient awakened and self-extubated and required re-intubation. At [facility], [facility], [facility], tolerated PS and subsequently extubated on 8/2 however required intubation within 24 hours. Hospital course complicated by septic shock 2/2 MRSA pneumonia and volume overload.  On September 1 after vomiting, aspirating, having an elevated troponin.  Past Medical History  Anxiety COPD GERD CAD Hypertension Hyperlipidemia Rheumatoid arthritis History of systolic heart failure, however 08/2018 Echo showed LVEF 50-55%, left atrium   Significant Hospital Events   8/1 admission to [facility] ICU 8/2 extubated 8/3 early AM agitated, placed on BIPAP, required re-intubation 8/4 started on steroids for COPD exacerbation 8/8 septic shock requiring pressor support 8/9 off pressors, respiratory cultures +s.aureus 8/10 persistent encephalopathy off sedation, CT head ordered 8/11 encephalopathy improved 8/12 difficulty weaning due to tachypnea, ETT exchanged due to dried blood clot burden 8/13 Bleeding fromsubclavian CVL. Exchanged for PICC line 8/17 started weaning on PSV 14/5 8/22 extubated  9/1 back to ICU after aspiration event, elevated troponin  Consults:  PCCM  Procedures:  8/1 ETT >8/2, 8/3 >8/22 8/1 L subclavian CVL >8/13 8/13 Rt PICC  Significant Diagnostic Tests:  08/2018 Echo> LVEF 50-55%, left atrium dilated  CT head 8/11-mild age-related atrophy and chronic microvascular ischemic changes. Old lacunar infarct.  Micro Data:  7/31 blood >NGTD 7/31 sars-cov-2 >positive 8/1 Resp CX > Normal flora 8/7 Resp CX >MRSA 8/7 BCX > NGTD 8/8 UCX >  NGTD 8/15 Resp culture>  MRSA   Antimicrobials:  Cefepime 7/31 >8/4  Flagyl 7/31  Remdesivir 8/1 >8/5  Actemra 8/1  Decadron/solumedrol 8/1 >8/8  Zosyn 8/8>8/9 Azithro 8/7>8/9  Cefepime 8/14 > Vanc 7/31, 8/8>8/22  Interim history/subjective:  Transferred back to the ICU overnight in setting of aspiration, elevated troponin This morning says his breathing is better Nurse notices that he aspirates on most food  Objective   Blood pressure 129/66, pulse 78, temperature 98.4 F (36.9 C), temperature source Oral, resp. rate (!) 21, height 6' (1.829 m), weight 106.1 kg, SpO2 97 %.        Intake/Output Summary (Last 24 hours) at 05/14/2019 1441 Last data filed at 05/14/2019 1300 Gross per 24 hour  Intake 621.44 ml  Output 3060 ml  Net -2438.56 ml   Filed Weights   05/05/19 0300 05/06/19 0500 05/07/19 0235  Weight: 104.2 kg 111 kg 106.1 kg    Examination:  General:  Resting comfortably in bed HENT: NCAT OP clear PULM: Crackes R base B, normal effort CV: RRR, no mgr GI: BS+, soft, nontender MSK: normal bulk and tone Neuro: awake, alert, no distress, MAEW   Resolved Hospital Problem list     Assessment & Plan:  Aspiration pneumonia: N.p.o. Speech therapy evaluation Pulmonary toilet as able: Incentive spirometry, flutter valve Unasyn  COPD: Albuterol scheduled Dul[place] scheduled  Demand ischemia: Administer oxygen to maintain O2 saturation greater than 92% Aspirin Consider transfer to [facility] given advanced coronary artery disease history  COVID: Appears to have resolved his syndrome of croup SARS-CoV-2 infection Repeat SARS-CoV-2 swab now, consider transfer to [facility] cardiac intensive care unit    Best practice:   Per [place]  CBC: Recent Labs  Lab 05/10/19 0650 05/11/19 0123 05/13/19 0206 05/13/19 2220 05/13/19 2305 05/14/19 0801  WBC 6.3 5.3 5.1 10.2  --  6.7  NEUTROABS  --   --   --  7.4  --   --   HGB 8.6* 8.8* 9.2*  10.7* 9.9* 8.9*  HCT 27.2* 27.5* 28.5* 34.1* 29.0* 27.7*  MCV 94.4 94.8 94.7 95.8  --  94.9  PLT 103* 94* 112* 175  --  127*    Basic Metabolic Panel: Recent Labs  Lab 05/10/19 0650 05/11/19 0123 05/13/19 0206 05/13/19 2220 05/13/19 2305 05/14/19 0801  NA 141 138 137 138 136 137  K 4.1 3.8 3.9 4.6 4.4 4.3  CL 103 99 98 99  --  97*  CO2 30 31 32 29  --  31  GLUCOSE 127* 173* 124* 356*  --  154*  BUN 31* 34* 25* 24*  --  26*  CREATININE 1.31* 1.29* 1.32* 1.36*  --  1.30*  CALCIUM 8.9 8.8* 8.9 9.3  --  9.3  MG  --   --   --  2.0  --   --    GFR: Estimated Creatinine Clearance: 70.4 mL/min (A) (by C-G formula based on SCr of 1.3 mg/dL (H)). Recent Labs  Lab 05/11/19 0123 05/13/19 0206 05/13/19 2220 05/14/19 0801  PROCALCITON  --   --   --  0.49  WBC 5.3 5.1 10.2 6.7  LATICACIDVEN  --   --  1.5  --     Liver Function Tests: Recent Labs  Lab 05/09/19 0058 05/10/19 0650 05/11/19 0123 05/13/19 2220 05/14/19 0801  AST 26 28 33 36 31  ALT 53* 52* 58* 73* 64*  ALKPHOS 111 112 130* 187* 156*  BILITOT 0.2* 0.4 0.4 0.4 0.1*  PROT 6.0* 6.0* 6.3* 7.3 6.6  ALBUMIN 2.3* 2.2* 2.3* 2.6* 2.4*   No results for input(s): LIPASE, AMYLASE in the last 168 hours. No results for input(s): AMMONIA in the last 168 hours.  ABG    Component Value Date/Time   PHART 7.308 (L) 05/13/2019 2305   PCO2ART 61.7 (H) 05/13/2019 2305   PO2ART 48.0 (L) 05/13/2019 2305   HCO3 31.0 (H) 05/13/2019 2305   TCO2 33 (H) 05/13/2019 2305   ACIDBASEDEF 1.0 04/24/2019 0424   O2SAT 79.0 05/13/2019 2305     Coagulation Profile: No results for input(s): INR, PROTIME in the last 168 hours.  Cardiac Enzymes: No results for input(s): CKTOTAL, CKMB, CKMBINDEX, TROPONINI in the last 168 hours.  HbA1C: Hgb A1c MFr Bld  Date/Time Value Ref Range Status  04/11/2019 09:50 PM 7.4 (H) 4.8 - 5.6 % Final    Comment:    (NOTE) Pre diabetes:          5.7%-6.4% Diabetes:              >6.4% Glycemic control  for   <7.0% adults with diabetes   06/07/2017 12:02 AM 9.1 (H) 4.8 - 5.6 % Final    Comment:    (NOTE) Pre diabetes:          5.7%-6.4% Diabetes:              >6.4% Glycemic control for   <7.0% adults with diabetes     CBG: Recent Labs  Lab 05/13/19 2016 05/14/19 0122 05/14/19 0403 05/14/19 0759 05/14/19 1212  GLUCAP 305* 348* 268* 141* 128*     Critical care time: n/a    Roselie Awkward, MD [place] P[facility]M Pager: [phone]  Cell: [phone] If no response, call [phone]

## 2019-05-14 NOTE — Progress Notes (Signed)
PT Cancellation Note  Patient Details Name: Robert [name] MRN: [id number] DOB: 1951/04/14   Cancelled Treatment:    Reason Eval/Treat Not Completed: Medical issues which prohibited therapy, medical decline, transfer to ICU.    Claretha Cooper 05/14/2019, 9:35 AM

## 2019-05-14 NOTE — Progress Notes (Signed)
  Speech Language Pathology Treatment: Dysphagia  Patient Details Name: Robert [name] MRN: [id number] DOB: 10-03-1950 Today's Date: 05/14/2019 Time: 1540-1600 SLP Time Calculation (min) (ACUTE ONLY): 20 min  Assessment / Plan / Recommendation Clinical Impression  Pt had probable aspiration event(s) on step down and RN reports significant regurgitation with meal this am. Today pt appears similar to prior baseline, still dysphonic, but alert. SLp offered sip sof nectar thick liquid, but provided verbal instruction to slow rate, which pt followed. Slight throat clear noted, as in prior observation, but no coughing or regurgitation as RN saw with mech soft meal. Also gave pureed solid without difficulty. Strongly suspect pt was not tolerating solid textures. He has dentures, but was a fast eater, was likely not sitting upright for most meals and may have been eating very impulsively. Overall do not suspect a gross decline in swallow ability, but potentially a need for a softer diet and increased safety precautions. While in ICU with max supervision, will trial a puree diet and honey thick liquids and f/u for tolerance.    HPI HPI: 68 year old male with history of COPD, CAD, diabetes mellitus, ischemic cardiomyopathy, RA, presented to the hospital and was admitted on 04/11/2019 with shortness of breath, he was found to be significantly hypoxic, tested positive for COVID-19, was intubated and transferred to [facility] for further management. Intubated 8/1-8/2, reintubated 8/3-8/22.       SLP Plan  Continue with current plan of care       Recommendations  Diet recommendations: Dysphagia 1 (puree);Honey-thick liquid Liquids provided via: Cup;No straw                Oral Care Recommendations: Oral care BID Follow up Recommendations: Inpatient Rehab SLP Visit Diagnosis: Dysphagia, oropharyngeal phase (R13.12) Plan: Continue with current plan of care       GO               Robert Baltimore, MA  [place] Pager [phone] Office [phone]  [name] 05/14/2019, 4:14 PM

## 2019-05-14 NOTE — Progress Notes (Signed)
OT Cancellation Note  Patient Details Name: Robert [name] MRN: [id number] DOB: [birth year]   Cancelled Treatment:    Reason Eval/Treat Not Completed: Medical issues which prohibited therapy. Decline in medical status - transferred to ICU. Troponins elevated. Will follow and assess when medically appropriate.   Ramond Dial, OT/L   Acute OT Clinical Specialist Acute Rehabilitation Services Pager [phone] Office [phone]  05/14/2019, 8:49 AM

## 2019-05-14 NOTE — Progress Notes (Signed)
ANTICOAGULATION CONSULT NOTE - Initial Consult  Pharmacy Consult for heparin Indication: chest pain/ACS  Allergies  Allergen Reactions  . Ivp Dye [Iodinated Diagnostic Agents] Itching and Rash    Patient Measurements: Height: 6' (182.9 cm) Weight: 233 lb 14.5 oz (106.1 kg) IBW/kg (Calculated) : 77.6 Heparin Dosing Weight: 99 kg   Vital Signs: Temp: 98 F (36.7 C) (09/02 0746) Temp Source: Oral (09/02 0746) BP: 132/68 (09/02 0630) Pulse Rate: 75 (09/02 0630)  Labs: Recent Labs    05/13/19 0206 05/13/19 2220 05/13/19 2305 05/14/19 0210  HGB 9.2* 10.7* 9.9*  --   HCT 28.5* 34.1* 29.0*  --   PLT 112* 175  --   --   CREATININE 1.32* 1.36*  --   --   TROPONINIHS  --  14  --  221*    Estimated Creatinine Clearance: 67.3 mL/min (A) (by C-G formula based on SCr of 1.36 mg/dL (H)).   Medical History: Past Medical History:  Diagnosis Date  . Anxiety   . COPD (chronic obstructive pulmonary disease) ([place])   . Coronary artery disease   . Full dentures   . GERD (gastroesophageal reflux disease)    Rolaids as needed  . High cholesterol   . History of MI (myocardial infarction)   . Hypertension    med. dosage increased 04/2016; has been on BP med. x 10 yrs.  . Incarcerated umbilical hernia 0000000  . Inguinal hernia 05/2016   bilateral   . Insulin dependent diabetes mellitus ([place])   . Ischemic cardiomyopathy    a. 03/2015 EF 40-45% by LV gram.  . Left leg cellulitis 10/08/2016  . Rheumatoid arthritis(714.0)     Medications:  Medications Prior to Admission  Medication Sig Dispense Refill Last Dose  . acarbose (PRECOSE) 100 MG tablet Take 100 mg by mouth 3 (three) times daily with meals.    unknown  . acetaminophen (TYLENOL) 650 MG CR tablet Take 650 mg by mouth every 8 (eight) hours as needed for pain.   unknown  . ALPRAZolam (XANAX) 0.5 MG tablet Take 0.5 mg by mouth 3 (three) times daily as needed for anxiety.    unknown  . AMBULATORY NON FORMULARY MEDICATION Take  600 mg by mouth daily at 12 noon. Medication Name: Dalcetrapib vs placebo (Patient taking differently: Take 600 mg by mouth every evening. Medication Name: Dalcetrapib vs placebo: Cholesterol)   unknown  . aspirin EC 81 MG EC tablet Take 1 tablet (81 mg total) by mouth daily.   unknown  . atorvastatin (LIPITOR) 80 MG tablet TAKE 1 TABLET DAILY AT 6 PM. (Patient taking differently: Take 80 mg by mouth daily at 6 PM. ) 90 tablet 3 unknown  . clopidogrel (PLAVIX) 75 MG tablet TAKE (1) TABLET BY MOUTH ONCE DAILY. 90 tablet 0 unknown  . clotrimazole-betamethasone (LOTRISONE) cream Apply 1 application topically 2 (two) times daily.   unknown  . fish oil-omega-3 fatty acids 1000 MG capsule Take 1 g by mouth daily.    unknown  . folic acid (FOLVITE) 1 MG tablet Take 1 mg by mouth daily.    unknown  . furosemide (LASIX) 40 MG tablet Take 80mg  po bid for 5 days then 80mg  po daily (Patient taking differently: Take 80 mg by mouth daily. ) 180 tablet 3 unknown  . gabapentin (NEURONTIN) 300 MG capsule Take 300 mg by mouth 3 (three) times daily as needed (for pain).    unknown  . glipiZIDE (GLUCOTROL) 10 MG tablet Take 10 mg by mouth  2 (two) times daily.   unknown  . Insulin Glargine (TOUJEO SOLOSTAR [place]) Inject 80 Units into the skin daily.   unknown  . isosorbide mononitrate (IMDUR) 60 MG 24 hr tablet Take 1 tablet (60 mg total) by mouth daily. 90 tablet 1 unknown  . losartan (COZAAR) 25 MG tablet Take 0.5 tablets (12.5 mg total) by mouth daily. 30 tablet 6 unknown  . metFORMIN (GLUCOPHAGE) 1000 MG tablet Take 1 tablet (1,000 mg total) by mouth 2 (two) times daily with a meal. (Patient taking differently: Take 1,000-1,250 mg by mouth See admin instructions. Take 1500 mg by mouth in the morning and 1000 mg in the evening)   unknown  . methotrexate 50 MG/2ML injection Inject 2 mLs into the skin once a week. Takes every Thursday   unknown  . metoprolol tartrate (LOPRESSOR) 25 MG tablet TAKE 1 TABLET TWICE DAILY. KEEP  JULY APPOINTMENT. (Patient taking differently: Take 12.5 mg by mouth 2 (two) times daily. ) 180 tablet 0 unknown  . nitroGLYCERIN (NITROSTAT) 0.4 MG SL tablet Place 1 tablet (0.4 mg total) under the tongue every 5 (five) minutes x 3 doses as needed for chest pain. 25 tablet 2 unknown  . potassium chloride SA (K-DUR) 20 MEQ tablet 1 TABLET BY MOUTH DAILY. 30 tablet 1 unknown  . predniSONE (DELTASONE) 5 MG tablet Take 5 mg by mouth as directed. Filled on 04-10-19 # 48 dose pak ...   unknown  . primidone (MYSOLINE) 50 MG tablet Take 100 mg by mouth daily.    unknown  . Tocilizumab 162 MG/0.9ML SOSY Inject 162 mg into the skin every Monday. ACTEMRA   unknown    Assessment: 41 YOM with prolonged COVID-19 associated hospital stay now with acute change in mental status and bump in troponin concerning for ACS. Pharmacy consulted to start IV heparin. Of note, he has been on prophylactic Lovenox dosing with last dose given yesterday afternoon. He also has had issues with bleeding from endotracheal tube while he was intubated.   H/H remains low stable. Plt 175>127k. SCr 1.3  Goal of Therapy:  Heparin level 0.3-0.5 units/ml Monitor platelets by anticoagulation protocol: Yes   Plan:  -Lovenox SQ has been d/c'ed  -Heparin 4000 units IV bolus, then start IV heparin infusion at 1250 -F/u 6 hr HL -Monitor daily HL, CBC and s/s of bleeding  Albertina Parr, PharmD., BCPS Clinical Pharmacist Clinical phone for 05/14/19 until 5pm: [phone]

## 2019-05-14 NOTE — Progress Notes (Signed)
ANTICOAGULATION CONSULT NOTE - Initial Consult  Pharmacy Consult for heparin Indication: chest pain/ACS  Allergies  Allergen Reactions  . Ivp Dye [Iodinated Diagnostic Agents] Itching and Rash    Patient Measurements: Height: 6' (182.9 cm) Weight: 233 lb 14.5 oz (106.1 kg) IBW/kg (Calculated) : 77.6 Heparin Dosing Weight: 99 kg   Vital Signs: Temp: 98.4 F (36.9 C) (09/02 1200) Temp Source: Oral (09/02 1200) BP: 129/66 (09/02 1300) Pulse Rate: 78 (09/02 1300)  Labs: Recent Labs    05/13/19 0206 05/13/19 2220 05/13/19 2305 05/14/19 0210 05/14/19 0801 05/14/19 1504  HGB 9.2* 10.7* 9.9*  --  8.9*  --   HCT 28.5* 34.1* 29.0*  --  27.7*  --   PLT 112* 175  --   --  127*  --   HEPARINUNFRC  --   --   --   --   --  <0.10*  CREATININE 1.32* 1.36*  --   --  1.30*  --   TROPONINIHS  --  14  --  221* 1,269*  --     Estimated Creatinine Clearance: 70.4 mL/min (A) (by C-G formula based on SCr of 1.3 mg/dL (H)).   Medical History: Past Medical History:  Diagnosis Date  . Anxiety   . COPD (chronic obstructive pulmonary disease) ([place])   . Coronary artery disease   . Full dentures   . GERD (gastroesophageal reflux disease)    Rolaids as needed  . High cholesterol   . History of MI (myocardial infarction)   . Hypertension    med. dosage increased 04/2016; has been on BP med. x 10 yrs.  . Incarcerated umbilical hernia 0000000  . Inguinal hernia 05/2016   bilateral   . Insulin dependent diabetes mellitus ([place])   . Ischemic cardiomyopathy    a. 03/2015 EF 40-45% by LV gram.  . Left leg cellulitis 10/08/2016  . Rheumatoid arthritis(714.0)     Medications:  Medications Prior to Admission  Medication Sig Dispense Refill Last Dose  . acarbose (PRECOSE) 100 MG tablet Take 100 mg by mouth 3 (three) times daily with meals.    unknown  . acetaminophen (TYLENOL) 650 MG CR tablet Take 650 mg by mouth every 8 (eight) hours as needed for pain.   unknown  . ALPRAZolam (XANAX) 0.5 MG  tablet Take 0.5 mg by mouth 3 (three) times daily as needed for anxiety.    unknown  . AMBULATORY NON FORMULARY MEDICATION Take 600 mg by mouth daily at 12 noon. Medication Name: Dalcetrapib vs placebo (Patient taking differently: Take 600 mg by mouth every evening. Medication Name: Dalcetrapib vs placebo: Cholesterol)   unknown  . aspirin EC 81 MG EC tablet Take 1 tablet (81 mg total) by mouth daily.   unknown  . atorvastatin (LIPITOR) 80 MG tablet TAKE 1 TABLET DAILY AT 6 PM. (Patient taking differently: Take 80 mg by mouth daily at 6 PM. ) 90 tablet 3 unknown  . clopidogrel (PLAVIX) 75 MG tablet TAKE (1) TABLET BY MOUTH ONCE DAILY. 90 tablet 0 unknown  . clotrimazole-betamethasone (LOTRISONE) cream Apply 1 application topically 2 (two) times daily.   unknown  . fish oil-omega-3 fatty acids 1000 MG capsule Take 1 g by mouth daily.    unknown  . folic acid (FOLVITE) 1 MG tablet Take 1 mg by mouth daily.    unknown  . furosemide (LASIX) 40 MG tablet Take 80mg  po bid for 5 days then 80mg  po daily (Patient taking differently: Take 80 mg by mouth  daily. ) 180 tablet 3 unknown  . gabapentin (NEURONTIN) 300 MG capsule Take 300 mg by mouth 3 (three) times daily as needed (for pain).    unknown  . glipiZIDE (GLUCOTROL) 10 MG tablet Take 10 mg by mouth 2 (two) times daily.   unknown  . Insulin Glargine (TOUJEO SOLOSTAR [place]) Inject 80 Units into the skin daily.   unknown  . isosorbide mononitrate (IMDUR) 60 MG 24 hr tablet Take 1 tablet (60 mg total) by mouth daily. 90 tablet 1 unknown  . losartan (COZAAR) 25 MG tablet Take 0.5 tablets (12.5 mg total) by mouth daily. 30 tablet 6 unknown  . metFORMIN (GLUCOPHAGE) 1000 MG tablet Take 1 tablet (1,000 mg total) by mouth 2 (two) times daily with a meal. (Patient taking differently: Take 1,000-1,250 mg by mouth See admin instructions. Take 1500 mg by mouth in the morning and 1000 mg in the evening)   unknown  . methotrexate 50 MG/2ML injection Inject 2 mLs into the  skin once a week. Takes every Thursday   unknown  . metoprolol tartrate (LOPRESSOR) 25 MG tablet TAKE 1 TABLET TWICE DAILY. KEEP JULY APPOINTMENT. (Patient taking differently: Take 12.5 mg by mouth 2 (two) times daily. ) 180 tablet 0 unknown  . nitroGLYCERIN (NITROSTAT) 0.4 MG SL tablet Place 1 tablet (0.4 mg total) under the tongue every 5 (five) minutes x 3 doses as needed for chest pain. 25 tablet 2 unknown  . potassium chloride SA (K-DUR) 20 MEQ tablet 1 TABLET BY MOUTH DAILY. 30 tablet 1 unknown  . predniSONE (DELTASONE) 5 MG tablet Take 5 mg by mouth as directed. Filled on 04-10-19 # 48 dose pak ...   unknown  . primidone (MYSOLINE) 50 MG tablet Take 100 mg by mouth daily.    unknown  . Tocilizumab 162 MG/0.9ML SOSY Inject 162 mg into the skin every Monday. ACTEMRA   unknown    Assessment: 67 YOM with prolonged COVID-19 associated hospital stay now with acute change in mental status and bump in troponin concerning for ACS. Pharmacy consulted to start IV heparin. Of note, he has been on prophylactic Lovenox dosing with last dose given yesterday afternoon. He also has had issues with bleeding from endotracheal tube while he was intubated.   H/H remains low stable. Plt 175>127k. SCr 1.3  HL undetectable. RN reports red-colored urine and clots in urine. Infusion was not paused.   Goal of Therapy:  Heparin level 0.3-0.5 units/ml Monitor platelets by anticoagulation protocol: Yes   Plan:  -No bolus -Increase heparin infusion to 1550 units/hr -F/u 6 hr HL, CBC -Monitor daily HL, CBC and s/s of bleeding  Ulice Dash, PharmD, BCPS Clinical Pharmacist

## 2019-05-14 NOTE — Progress Notes (Addendum)
Ativan not given d/t no IV access. And by time was placed pt was worn out and sedate.  Daughter Maudie Mercury notified of change in pt's status and that he was being transferred back to ICU.

## 2019-05-14 NOTE — Progress Notes (Signed)
  Echocardiogram 2D Echocardiogram limited has been performed.  Darlina Sicilian M 05/14/2019, 8:38 AM

## 2019-05-14 NOTE — Progress Notes (Signed)
CRITICAL VALUE ALERT  Critical Value:  Troponin 221  Date & Time Notied:  05/14/2019 0405  Provider Notified: Olevia Bowens

## 2019-05-14 NOTE — Progress Notes (Signed)
Pt had foley removed earlier today, was in and out cathed for 2 L, has hematuria and blood ooze from meatus (on heparin gtt, levels being drawn now.   Pt c/o need to void, unable to void in urinal, bladder scan done, 743 ml, pt refuses I & O despite education/reassurance. Discussed with charge nurse, will hold off on in and out for now.

## 2019-05-14 NOTE — Progress Notes (Signed)
Assisted tele visit to patient with provider Dr Meda Coffee (Cardiology).  Maryelizabeth Rowan, RN

## 2019-05-14 NOTE — Consult Note (Addendum)
Cardiology Consultation:   Due to the COVID-19 pandemic, this visit was completed with telemedicine (audio/video) technology to reduce patient and provider exposure as well as to preserve personal protective equipment.   Patient ID: Robert [name] MRN: [id number]; DOB: [birth year]  Admit date: 04/11/2019 Date of Consult: 05/14/2019  Primary Care Provider: Redmond School, MD Primary Cardiologist: Sanda Klein, MD  Primary Electrophysiologist:  None    Patient Profile:   Robert [name] is a 68 y.o. male with a PMH of premature onset CAD with multiple interventions c/b in-stent restenosis, chronic combined CHF (EF 40-45% 05/2017 with improvement to normal on subsequent echos) ischemic cardiomyopathy, PAD with chronic occlusion of L popliteal artery (medically managed), DM type 2, COPD, RA, who is being seen today for the evaluation of elevated troponins at the request of Dr. Waldron Labs.  History of Present Illness:   Mr. Mole initially presented to the ED 04/11/2019 with complaints of SOB, found to be significantly hypoxic and tested positive for COVID-19. He was intubated and transferred to [facility] for further management. His hospital course has ben complicated by persistent encephalopathy, MRSA PNA with septic shock s/p antibiotics course, acute on chronic CHF, and atrial fibrillation with RVR. He was extubated 05/03/2019 and unfortunately developed worsening respiratory failure with increased work of breathing and was transferred back to the ICU after experiencing acute onset diaphoresis, tachycardia, tachypnea, and confusion. Differential included aspiration, PE, or cardiac event. CXR showed worsening RLL opacity c/f aspiration PNA. HE had no complaints of chest pain at that time. Troponins 221>1269. EKG with sinus rhythm with nonspecific T wave abnormalities, no STE/D. Echo with EF 50-55%, G3DD, moderate basal septal hypertrophy, poor acoustical windows so unable to determine RWMA, moderate MR, and  mildly dilated LA. Cardiology asked to evaluate for elevated troponins. Patient reports improvement in breathing since last night. No complaints of LE edema, orthopnea, or PND.  He was last evaluated by cardiology at an outpatient visit with Dr. Sallyanne Kuster 03/25/2019 just prior to his [place] admission. He was grieving the death of his wife, who passed away in May Robert [name] due to complications of an abdominal infection. Otherwise he was stable from a cardiac standpoint with no complaints of chest pain and unchanged DOE. His imdur was decreased to 60mg  daily, otherwise no medication changes occurred. His last ischemic evaluation was a LHC 05/2017 after presenting with a STEMI which showed repeat stent thrombus in the LCx managed with angioplasty, with CTO of the pRCA and stable moderate stenosis in LAD. He was evaluated for possible CABG but felt he would not benefit from revascularization due to poor targets and chronic occlusions.   Past Medical History:  Diagnosis Date   Anxiety    COPD (chronic obstructive pulmonary disease) (HCC)    Coronary artery disease    Full dentures    GERD (gastroesophageal reflux disease)    Rolaids as needed   High cholesterol    History of MI (myocardial infarction)    Hypertension    med. dosage increased 04/2016; has been on BP med. x 10 yrs.   Incarcerated umbilical hernia 0000000   Inguinal hernia 05/2016   bilateral    Insulin dependent diabetes mellitus ([place])    Ischemic cardiomyopathy    a. 03/2015 EF 40-45% by LV gram.   Left leg cellulitis 10/08/2016   Rheumatoid arthritis(714.0)     Past Surgical History:  Procedure Laterality Date   CARDIAC CATHETERIZATION N/A 02/01/2015   Procedure: Left Heart Cath and Coronary Angiography;  Surgeon:  Belva Crome, MD;  Location: [place] [facility];  Service: Cardiovascular;  Laterality: N/A;   CARDIAC CATHETERIZATION N/A 03/22/2015   Procedure: Left Heart Cath and Coronary Angiography;  Surgeon: Leonie Man, MD;  Location: [place] [facility];  Service: Cardiovascular;  Laterality: N/A;   CARDIAC CATHETERIZATION  09/05/2002; 03/09/2003; 04/10/2005;06/02/2005; 05/09/2006; 02/09/2007; 03/01/2007   COLONOSCOPY  2008   Dr. Gala Romney: diverticulosis, hyperplastic polyp.   CORONARY ANGIOPLASTY  11/14/2012; 02/01/2015   CORONARY ANGIOPLASTY WITH STENT PLACEMENT  05/16/2012   "1; makes total ~ 4"   CORONARY STENT INTERVENTION N/A 06/06/2017   Procedure: CORONARY STENT INTERVENTION;  Surgeon: Belva Crome, MD;  Location: [place] [facility];  Service: Cardiovascular;  Laterality: N/A;   INSERTION OF MESH Bilateral 05/24/2016   Procedure: INSERTION OF MESH;  Surgeon: Coralie Keens, MD;  Location: [place];  Service: General;  Laterality: Bilateral;   LAPAROSCOPIC CHOLECYSTECTOMY     LAPAROSCOPIC INGUINAL HERNIA WITH UMBILICAL HERNIA Bilateral 05/24/2016   Procedure: BILATERAL LAPAROSCOPIC INGUINAL HERNIA REPAIR WITH MESH AND UMBILICAL HERNIA REPAIR WITH MESH;  Surgeon: Coralie Keens, MD;  Location: [place];  Service: General;  Laterality: Bilateral;   LEFT HEART CATH AND CORONARY ANGIOGRAPHY N/A 06/06/2017   Procedure: LEFT HEART CATH AND CORONARY ANGIOGRAPHY;  Surgeon: Belva Crome, MD;  Location: [place] [facility];  Service: Cardiovascular;  Laterality: N/A;   LEFT HEART CATHETERIZATION WITH CORONARY ANGIOGRAM N/A 12/22/2011   Procedure: LEFT HEART CATHETERIZATION WITH CORONARY ANGIOGRAM;  Surgeon: Troy Sine, MD;  Location: [facility] CATH LAB;  Service: Cardiovascular;  Laterality: N/A;   LEFT HEART CATHETERIZATION WITH CORONARY ANGIOGRAM Bilateral 05/16/2012   Procedure: LEFT HEART CATHETERIZATION WITH CORONARY ANGIOGRAM;  Surgeon: Lorretta Harp, MD;  Location: [facility] CATH LAB;  Service: Cardiovascular;  Laterality: Bilateral;   LEFT HEART CATHETERIZATION WITH CORONARY ANGIOGRAM N/A 11/14/2012   Procedure: LEFT HEART CATHETERIZATION WITH CORONARY ANGIOGRAM;  Surgeon:  Troy Sine, MD;  Location: [facility] CATH LAB;  Service: Cardiovascular;  Laterality: N/A;   LEFT HEART CATHETERIZATION WITH CORONARY ANGIOGRAM N/A 09/01/2013   Procedure: LEFT HEART CATHETERIZATION WITH CORONARY ANGIOGRAM;  Surgeon: Leonie Man, MD;  Location: [facility] CATH LAB;  Service: Cardiovascular;  Laterality: N/A;   LUMBAR LAMINECTOMY  1972; 1973; 1985   x [place]  08/30/2009   No significant ischemia   OPEN REDUCTION INTERNAL FIXATION (ORIF) DISTAL RADIAL FRACTURE Right 12/10/2016   Procedure: OPEN REDUCTION INTERNAL FIXATION (ORIF) DISTAL RADIAL FRACTURE;  Surgeon: Iran Planas, MD;  Location: [place];  Service: Orthopedics;  Laterality: Right;   PERCUTANEOUS CORONARY INTERVENTION-BALLOON ONLY  12/22/2011   Procedure: PERCUTANEOUS CORONARY INTERVENTION-BALLOON ONLY;  Surgeon: Troy Sine, MD;  Location: [facility] CATH LAB;  Service: Cardiovascular;;   PERCUTANEOUS CORONARY INTERVENTION-BALLOON ONLY  11/14/2012   Procedure: PERCUTANEOUS CORONARY INTERVENTION-BALLOON ONLY;  Surgeon: Troy Sine, MD;  Location: [facility] CATH LAB;  Service: Cardiovascular;;   PERCUTANEOUS CORONARY STENT INTERVENTION (PCI-S)  05/16/2012   Procedure: PERCUTANEOUS CORONARY STENT INTERVENTION (PCI-S);  Surgeon: Lorretta Harp, MD;  Location: [facility] CATH LAB;  Service: Cardiovascular;;   PERCUTANEOUS CORONARY STENT INTERVENTION (PCI-S)  09/01/2013   Procedure: PERCUTANEOUS CORONARY STENT INTERVENTION (PCI-S);  Surgeon: Leonie Man, MD;  Location: [facility] CATH LAB;  Service: Cardiovascular;;     Home Medications:  Prior to Admission medications   Medication Sig Start Date End Date Taking? Authorizing Provider  acarbose (PRECOSE) 100 MG tablet  Take 100 mg by mouth 3 (three) times daily with meals.  03/08/17  Yes [provider]  acetaminophen (TYLENOL) 650 MG CR tablet Take 650 mg by mouth every 8 (eight) hours as needed for pain.   Yes [provider]  ALPRAZolam Duanne Moron) 0.5 MG tablet  Take 0.5 mg by mouth 3 (three) times daily as needed for anxiety.    Yes [provider]  AMBULATORY NON FORMULARY MEDICATION Take 600 mg by mouth daily at 12 noon. Medication Name: Dalcetrapib vs placebo Patient taking differently: Take 600 mg by mouth every evening. Medication Name: Dalcetrapib vs placebo: Cholesterol 12/15/16  Yes Jettie Booze, MD  aspirin EC 81 MG EC tablet Take 1 tablet (81 mg total) by mouth daily. 11/15/12  Yes Brett Canales, PA-C  atorvastatin (LIPITOR) 80 MG tablet TAKE 1 TABLET DAILY AT 6 PM. Patient taking differently: Take 80 mg by mouth daily at 6 PM.  10/30/18  Yes Croitoru, Mihai, MD  clopidogrel (PLAVIX) 75 MG tablet TAKE (1) TABLET BY MOUTH ONCE DAILY. 02/17/19  Yes Croitoru, Mihai, MD  clotrimazole-betamethasone (LOTRISONE) cream Apply 1 application topically 2 (two) times daily.   Yes [provider]  fish oil-omega-3 fatty acids 1000 MG capsule Take 1 g by mouth daily.    Yes [provider]  folic acid (FOLVITE) 1 MG tablet Take 1 mg by mouth daily.    Yes [provider]  furosemide (LASIX) 40 MG tablet Take 80mg  po bid for 5 days then 80mg  po daily Patient taking differently: Take 80 mg by mouth daily.  02/17/19  Yes Croitoru, Mihai, MD  gabapentin (NEURONTIN) 300 MG capsule Take 300 mg by mouth 3 (three) times daily as needed (for pain).  03/10/17  Yes [provider]  glipiZIDE (GLUCOTROL) 10 MG tablet Take 10 mg by mouth 2 (two) times daily. 02/12/17  Yes [provider]  Insulin Glargine (TOUJEO SOLOSTAR [place]) Inject 80 Units into the skin daily.   Yes [provider]  isosorbide mononitrate (IMDUR) 60 MG 24 hr tablet Take 1 tablet (60 mg total) by mouth daily. 03/25/19  Yes Croitoru, Mihai, MD  losartan (COZAAR) 25 MG tablet Take 0.5 tablets (12.5 mg total) by mouth daily. 06/12/17  Yes Bhagat, Bhavinkumar, PA  metFORMIN (GLUCOPHAGE) 1000 MG tablet Take 1 tablet (1,000 mg total) by mouth 2 (two)  times daily with a meal. Patient taking differently: Take 1,000-1,250 mg by mouth See admin instructions. Take 1500 mg by mouth in the morning and 1000 mg in the evening 03/23/15  Yes Bhagat, Bhavinkumar, PA  methotrexate 50 MG/2ML injection Inject 2 mLs into the skin once a week. Takes every Thursday 12/01/17  Yes [provider]  metoprolol tartrate (LOPRESSOR) 25 MG tablet TAKE 1 TABLET TWICE DAILY. KEEP JULY APPOINTMENT. Patient taking differently: Take 12.5 mg by mouth 2 (two) times daily.  02/24/19  Yes Croitoru, Mihai, MD  nitroGLYCERIN (NITROSTAT) 0.4 MG SL tablet Place 1 tablet (0.4 mg total) under the tongue every 5 (five) minutes x 3 doses as needed for chest pain. 04/24/16  Yes Croitoru, Mihai, MD  potassium chloride SA (K-DUR) 20 MEQ tablet 1 TABLET BY MOUTH DAILY. 03/13/19  Yes Croitoru, Mihai, MD  predniSONE (DELTASONE) 5 MG tablet Take 5 mg by mouth as directed. Filled on 04-10-19 # 48 dose pak ...   Yes [provider]  primidone (MYSOLINE) 50 MG tablet Take 100 mg by mouth daily.    Yes [provider]  Tocilizumab 162 MG/0.9ML SOSY Inject 162 mg into the skin every Monday. ACTEMRA   Yes [provider]    Inpatient Medications: Scheduled Meds:  albuterol  2 puff Inhalation Q6H   [START ON 05/15/2019] aspirin  81 mg Oral Daily   atorvastatin  80 mg Oral q1800   bethanechol  10 mg Oral BID   bisacodyl  10 mg Rectal Once   bisacodyl  10 mg Rectal Once   Chlorhexidine Gluconate Cloth  6 each Topical Daily   clopidogrel  75 mg Oral Daily   collagenase   Topical Daily   feeding supplement (ENSURE ENLIVE)  237 mL Oral TID BM   folic acid  1 mg Intravenous Daily   [START ON 05/15/2019] furosemide  80 mg Intravenous Once   furosemide  80 mg Intravenous BID   gabapentin  100 mg Oral Q8H   insulin aspart  0-15 Units Subcutaneous Q4H   insulin glargine  15 Units Subcutaneous Daily   LORazepam  1 mg Intravenous Once   mouth rinse  15 mL  Mouth Rinse BID   metoprolol tartrate  50 mg Oral BID   mometasone-formoterol  2 puff Inhalation BID   multivitamin with minerals  1 tablet Oral Daily   polyethylene glycol  34 g Oral Once   senna-docusate  3 tablet Oral BID   sodium chloride flush  10-40 mL Intracatheter Q12H   tamsulosin  0.8 mg Oral QPC supper   Continuous Infusions:  sodium chloride Stopped (05/05/19 1136)   ampicillin-sulbactam (UNASYN) IV 3 g (05/14/19 1733)   heparin 1,550 Units/hr (05/14/19 1732)   PRN Meds: sodium chloride, acetaminophen, dextrose, hydrALAZINE, Resource ThickenUp Clear, sodium chloride flush, Thrombi-Pad  Allergies:    Allergies  Allergen Reactions   Ivp Dye [Iodinated Diagnostic Agents] Itching and Rash    Social History:   Social History   Socioeconomic History   Marital status: Married    Spouse name: Not on file   Number of children: Not on file   Years of education: Not on file   Highest education level: Not on file  Occupational History   Occupation: tractor trailer [place] resource strain: Not on file   Food insecurity    Worry: Not on file    Inability: Not on file   Transportation needs    Medical: Not on file    Non-medical: Not on file  Tobacco Use   Smoking status: Current Some Day Smoker    Packs/day: 1.00    Types: Cigarettes    Last attempt to quit: 06/11/2015    Years since quitting: 3.9   Smokeless tobacco: Never Used   Tobacco comment: pt states smoking less than half a pack  Substance and Sexual Activity   Alcohol use: No   Drug use: No   Sexual activity: Yes  Lifestyle   Physical activity    Days per week: Not on file    Minutes per session: Not on file   Stress: Not on file  Relationships   Social connections    Talks on phone: Not on file    Gets together: Not on file    Attends religious service: Not on file    Active member of club or organization: Not on file    Attends meetings of  clubs or organizations: Not on file    Relationship status: Not on file   Intimate partner violence    Fear of current or ex partner: Not  on file    Emotionally abused: Not on file    Physically abused: Not on file    Forced sexual activity: Not on file  Other Topics Concern   Not on file  Social History Narrative   Not on file    Family History:    Family History  Problem Relation Age of Onset   Heart disease Father    Colon cancer Brother        early 8s   Hypertension Mother    Heart disease Mother    Colon cancer Mother        died age 77. late 76s early 20s at diagnosis.    Lung cancer Mother      ROS:  Please see the history of present illness.   All other ROS reviewed and negative.     Physical Exam/Data:   Vitals:   05/14/19 1000 05/14/19 1100 05/14/19 1200 05/14/19 1300  BP: 132/73  126/65 129/66  Pulse: 91 80 79 78  Resp: (!) 28 (!) 21 18 (!) 21  Temp:   98.4 F (36.9 C)   TempSrc:   Oral   SpO2: 95% 95% 94% 97%  Weight:      Height:        Intake/Output Summary (Last 24 hours) at 05/14/2019 1818 Last data filed at 05/14/2019 1800 Gross per 24 hour  Intake 621.44 ml  Output 3310 ml  Net -2688.56 ml   Last 3 Weights 05/07/2019 05/06/2019 05/05/2019  Weight (lbs) 233 lb 14.5 oz 244 lb 11.4 oz 229 lb 11.5 oz  Weight (kg) 106.1 kg 111 kg 104.2 kg     Body mass index is 31.72 kg/m.   VITAL SIGNS:  reviewed GEN:  no acute distress EYES:  sclerae anicteric, EOMI - Extraocular Movements Intact RESPIRATORY:  normal respiratory effort, symmetric expansion CARDIOVASCULAR:  no peripheral edema SKIN:  chronic venous stasis skin changes to LE MUSCULOSKELETAL:  no obvious deformities. NEURO:  alert and oriented x 3, no obvious focal deficit PSYCH:  normal affect  EKG:  The EKG was personally reviewed and demonstrates:  sinus rhythm with nonspecific T wave abnormalities, no STE/D  Relevant CV Studies: Echocardiogram 05/14/2019: IMPRESSIONS     1. The left ventricle has low normal systolic function, with an ejection fraction of 50-55%. The cavity size was mildly dilated. moderate basal septal hypertrophy. Left ventricular diastolic Doppler parameters are consistent with restrictive filling. Elevated left ventricular end-diastolic pressure. Inadequate for regional wall motion assessement due to poor acoutsical windows and inability to adequately visualize the endocardium. Recommend limited definity contrast study.  2. The right ventricle has normal systolc function. The cavity was normal. There is no increase in right ventricular wall thickness. Right ventricular systolic pressure could not be assessed.  3. Left atrial size was mildly dilated.  4. There is mild mitral annular calcification present. Mitral valve regurgitation is mild to moderate by color flow Doppler.  5. Aortic valve regurgitation was not assessed by color flow Doppler.  6. The aortic root and ascending aorta are normal in size and structure.  7. The inferior vena cava was dilated in size with >50% respiratory variability.  Laboratory Data:  Chemistry Recent Labs  Lab 05/13/19 0206 05/13/19 2220 05/13/19 2305 05/14/19 0801  NA 137 138 136 137  K 3.9 4.6 4.4 4.3  CL 98 99  --  97*  CO2 32 29  --  31  GLUCOSE 124* 356*  --  154*  BUN 25* 24*  --  26*  CREATININE 1.32* 1.36*  --  1.30*  CALCIUM 8.9 9.3  --  9.3  GFRNONAA 56* 54*  --  57*  GFRAA >60 >60  --  >60  ANIONGAP 7 10  --  9    Recent Labs  Lab 05/11/19 0123 05/13/19 2220 05/14/19 0801  PROT 6.3* 7.3 6.6  ALBUMIN 2.3* 2.6* 2.4*  AST 33 36 31  ALT 58* 73* 64*  ALKPHOS 130* 187* 156*  BILITOT 0.4 0.4 0.1*   Hematology Recent Labs  Lab 05/13/19 0206 05/13/19 2220 05/13/19 2305 05/14/19 0801  WBC 5.1 10.2  --  6.7  RBC 3.01* 3.56*  --  2.92*  HGB 9.2* 10.7* 9.9* 8.9*  HCT 28.5* 34.1* 29.0* 27.7*  MCV 94.7 95.8  --  94.9  MCH 30.6 30.1  --  30.5  MCHC 32.3 31.4  --  32.1  RDW 16.9*  16.2*  --  16.2*  PLT 112* 175  --  127*   Cardiac EnzymesNo results for input(s): TROPONINI in the last 168 hours. No results for input(s): TROPIPOC in the last 168 hours.  BNP Recent Labs  Lab 05/14/19 0801  BNP 383.8*    DDimer  Recent Labs  Lab 05/13/19 2220  DDIMER 3.55*    Radiology/Studies:  Dg Abd 1 View  Result Date: 05/13/2019 CLINICAL DATA:  Vomiting EXAM: ABDOMEN - 1 VIEW COMPARISON:  04/30/2019 FINDINGS: Nonobstructive pattern of bowel gas with scattered gas present to the rectum. Scattered stool in the colon and high density ingested material in the cecal base. No free air in the abdomen on supine radiographs. A previously seen enteric feeding tube and esophagogastric tube have been removed. IMPRESSION: Nonobstructive pattern of bowel gas with scattered gas present to the rectum. Scattered stool in the colon and high density ingested material in the cecal base. Electronically Signed   By: Eddie Candle M.D.   On: 05/13/2019 16:15   Dg Chest Port 1 View  Result Date: 05/13/2019 CLINICAL DATA:  Dyspnea. COVID-19 positive. EXAM: PORTABLE CHEST 1 VIEW COMPARISON:  04/25/2019 FINDINGS: Unchanged heart size with mild cardiomegaly. Unchanged mediastinal contours. Aortic atherosclerosis and coronary stent visualized. Opacification of the lower right hemithorax may represent combination of pleural fluid and airspace disease. Right costophrenic angle not included in the field of view. There is a left pleural effusion, increased from prior. Pulmonary and interstitial opacities edema has progressed. IMPRESSION: 1. Opacification of the lower right hemithorax may represent combination of pleural fluid and airspace disease, pneumonia versus aspiration. 2. Increased left pleural effusion since prior exam. Progressive interstitial opacities from prior likely combination of pulmonary edema and atypical pneumonia. Electronically Signed   By: Keith Rake M.D.   On: 05/13/2019 23:22     @[email or web address] @   Assessment and Plan:   1. Elevated troponin in patient with known CAD: patient with acute on chronic respiratory distress overnight with associated tachycardia and diaphoresis. CXR with c/f aspiration PNA. Troponin 221>1269. EKG with non-specific T wave abnormalities but overall non-ischemic. He has no complaints of chest pain. He has extensive CAD with multiple interventions with last [facility] 05/2017 with angioplasty for management of in-stent restenosis of LCx stent with CTO of RCA, and moderate LAD stenosis. Echo this admission with EF 50-55%, unable to assess for RWMA due to poor acoustic windows. Likely this is demand ischemia in the setting of aspiration.  - Will check another troponin now - if downward trend, do not anticipate further cardiac work-up. - Continue aspirin and plavix -  Continue metoprolol   2. Acute on chronic combined CHF: CXR with increase in pleural effusions. BNP up a bit from earlier this admission from 247>383. EF improved to 50-55%, up from 40-45% 05/2017, though now with G3DD. He has been intermittently diuresed with IV lasix this admission.  - Favor continued IV lasix 80mg  tomorrow, then reassess for further diuresis need - Continue to monitor daily weights and strict I&Os - Continue to monitor electrolytes and replete as needed to maintain K>4, Mg>2  3. HTN: BP intermittently elevated but overall stable - Continue current regimen  4. PAD: known occlusion in L popliteal artery with collaterals. No complaints of claudication - Continue aspirin and statin  5. Acute Hypoxic respiratory failure due to COVID-19, aspiration PNA, and acute on chronic CHF: Admitted 04/11/2019 with acute respiratory failure due to COVID-19. Required intubation. Extubated 8/22. Recurrent respiratory distress 05/13/2019 with CXR c/f aspiration PNA + volume overload. - Continue diuresis as above - Continue PNA management per primary team.    For questions or updates,  please contact [place] Please consult [email or web address] for contact info under   Signed, Ena Dawley, MD  05/14/2019 6:18 PM    The patient was seen, examined and discussed with Abigail Butts, PA-C  and I agree with the above.   68 y.o. male with a PMH of premature onset CAD with multiple interventions c/b in-stent restenosis, chronic combined CHF (EF 40-45% 05/2017 with improvement to normal on subsequent echos) ischemic cardiomyopathy, PAD with chronic occlusion of L popliteal artery (medically managed), DM type 2, COPD, RA, who is being seen today for the evaluation of elevated troponins at the request of Dr. Waldron Labs. Complicated history as above, admitted on ED 04/11/2019 - diagnosed with Covid and has been dealing complications of Covid related pneumonia/MRSA pneumonia/septic shock/respiratory failure, intubated 3x in the course of a month. He was extubated 05/03/2019 and unfortunately developed worsening respiratory failure with increased work of breathing and was transferred back to the ICU after experiencing acute onset diaphoresis, tachycardia, tachypnea, and confusion. Differential included aspiration, PE, or cardiac event. CXR showed worsening RLL opacity c/f aspiration PNA. HE had no complaints of chest pain at that time. Troponins 221>1269. EKG with sinus rhythm with nonspecific T wave abnormalities, no STE/D, unchanged from the prior done on 04/30/2019. Echo with EF 50-55%, G3DD, moderate basal septal hypertrophy, poor acoustical windows so unable to determine RWMA, moderate MR, and mildly dilated LA, unchanged from prior (personally reviewed).  I have spoken with the patient over the camera in his room, he appears comfortable, denies any chest pain or SOB, he has no LE edema.   I have reviewed his echocardiogram, he has LVEF 50-55% with no regional wall motion abnormalities, he has grade 3 diastolic dysfunction with significantly elevated filling pressures. His CXR from yesterday  shows pulmonary edema vs infiltrates, B/L pleural effusion, he was started on lasix 80 mg iv BID. I agree with that and would continue. His bed scale is broken so no weights in the last 5 days but the nurse is going to check his einght on a portable scale. We will follow, I&O, weights, crea closely. I will follow troponin. Most probably a result of CHF and hypoxia during recurrent resp failure/aspiration. Conservative management is recommended in the absence of symptoms and no new wall motion abnormalities.  Ena Dawley, MD 05/14/2019

## 2019-05-14 NOTE — Progress Notes (Addendum)
PROGRESS NOTE  Robert [name] [id number] DOB: 1950/12/31 DOA: 04/11/2019 PCP: Redmond School, MD   LOS: 32 days   Brief Narrative / Interim history:  68 year old male with history of COPD, CAD, diabetes mellitus, ischemic cardiomyopathy, RA, presented to the hospital and was admitted on 04/11/2019 with shortness of breath, he was found to be significantly hypoxic, tested positive for COVID-19, was intubated and transferred to [facility] for further management.  Hospital course complicated by persistent encephalopathy and difficulty weaning due to tachypnea and agitation, he failed extubation once on 8/2.  Hospital course was also complicated by MRSA pneumonia with septic shock at the beginning of August status post completed treatment. Patient eventually was eventually successfully extubated on 8/22 and transferred to the floor on 8/24  Significant events: 8/1 admission to [facility] ICU 8/2 extubated 8/3 early AM agitated, placed on BIPAP, required re-intubation 8/4 started on steroids for COPD exacerbation 8/8 septic shock requiring pressor support 8/9 off pressors, respiratory cultures +s.aureus 8/10 persistent encephalopathy off sedation, CT head ordered 8/11 encephalopathy improved 8/12 difficulty weaning due to tachypnea, ETT exchanged due to clot burden 8/13 PICC line inserted and subclavian central line removed 8/14 Antibiotics coverage broaden due to persistent fever and resp distress, pan cultured  8/19 more alert, tolerating pressure support 8/20 failed voiding trial, Foley needed to be reinserted 8/22 successfully extubated  8/24 transfer to the floor 9/1  transferred to ICU given worsening respiratory failure, increased work of breathing  Subjective: -It was noted to be hypoxic overnight with increased work of breathing, transferred to ICU, he reports some dyspnea, but denies any chest pain .  Assessment & Plan: Principal Problem:   Pneumonia due to COVID-19 virus Active  Problems:   Rheumatoid arthritis ([place])   Hyperlipidemia   COPD (chronic obstructive pulmonary disease) (HCC)   Controlled type 2 diabetes mellitus with diabetic peripheral angiopathy without gangrene, with long-term current use of insulin (HCC)   Essential hypertension   Ischemic cardiomyopathy   Coronary artery disease   Dyslipidemia (high LDL; low HDL)   Acute respiratory failure with hypoxemia (HCC)   COVID-19 virus detected   Acute respiratory distress syndrome (ARDS) due to COVID-19 virus   Atrial fibrillation with RVR (HCC)   COPD (chronic obstructive pulmonary disease) with emphysema (HCC)   AKI (acute kidney injury) (HCC)   Diabetes mellitus type 2, uncontrolled, with complications (HCC)   Pressure injury of skin   Staphylococcal pneumonia ([place])   HCAP (healthcare-associated pneumonia)   Acute Hypoxic Respiratory Failure due to Covid-19 Viral Illness, COPD exacerbation -Extubated on 8/22, respiratory status remained stable till 9/1, he was on 1-2 L nasal cannula, felt most likely due to baseline COPD especially he was smoking 1.5 packs/day till date of admission to the hospital . -Continue his inhalers for COPD, no longer wheezing. -Patient with worsening respiratory failure this a.m., he is currently on 6 L nasal cannula, this is most likely in the setting of aspiration pneumonia, and likely cardiac event as well. -Patient received one-time 60 mg IV Lasix today  Fever: Temp (24hrs), Avg:98.4 F (36.9 C), Min:98 F (36.7 C), Max:98.8 F (37.1 C)  Antibiotics:  Cefepime 7/31 >8/4  Flagyl 7/31  Remdesivir 8/1 >8/5  Actemra 8/1  Decadron/solumedrol 8/1 >8/8  Zosyn 8/8>8/9 Azithro 8/7>8/9 Cefepime 8/14 >8/19 Vanc 7/31, 8/8>8/22  COVID-19 interventions: Remdesivir: 8/1 >> 8/5 Steroids: decadron / solumedrol 8/1 >> 8/5 Actemra: 8/1 Convalescent Plasma: none  Vitamin C and Zinc: yes   COVID-19 Labs  Recent Labs  05/13/19 2220  DDIMER 3.55*    Lab  Results  Component Value Date   SARSCOV2NAA NOT DETECTED 05/11/2019   SARSCOV2NAA POSITIVE (A) 05/07/2019   SARSCOV2NAA NOT DETECTED 05/06/2019   SARSCOV2NAA POSITIVE (A) 04/11/2019   NSTEMI/CAD -Patient with known history of CAD ,He is also known to have chronic total occlusion of the right coronary artery.  He has a 60% stenosis in the proximal LAD and a 60% stenosis in the ostial first diagonal artery, Known  frequent problems with in-stent restenosis after multiple intervention. -Patient's troponin trending up significantly 12>221>1236, he has some EKG changes in inferior leads, he denies any chest pain (reports his previous MIs were accompanied by chest pain), NSTEMI versus demand ischemia provoked by Aspiration  pneumonia/hypoxic event,started on heparin GTT, will give full dose aspirin, continue with Plavix, will resume his high-dose statin, 2D echo has obtained, reading is pending. -Cardiology  has been consulted regarding further recommendation.  Aspiration pneumonia -Chest x-ray this morning with worsening right lower lung opacity, patient reports couple episodes of coughing with diet, for now we will keep n.p.o., will reconsult SLP, and continue with Unasyn.   Paroxysmal A. fib with RVR -Currently in sinus, on metoprolol  -He was on IV heparin but no anticoagulation is on hold due to bleeding with ETT clotting as well as bleeding from the subclavian line -No further evidence of A. fib on telemetry, his A. fib episode most likely related to his acute illness secondary to COVID-19 infection and respiratory failure .  So far remains in sinus rhythm, with no A. fib recurrence,   Will need anticoagulation if A. fib recurs.  MRSA HCAP /septic shock -Off pressors now -Completed treatment with vancomycin for 3 weeks, last day was 8/22 -Respiratory status improving, extubated on 8/22 AM, currently stable on the floor   Acute on chronic diastolic CHF, essential hypertension, history of  CAD -History of PCI in 2018 with in-stent restenosis, -He denies any chest pain, continue with aspirin, Plavix. -Creatinine 1.3 today, will continue to hold lasix, he appears to be euvolemic  Deconditioning/ICU induced muscle weakness -Aggressive PT/OT -On dysphagia 3 with thick liquids per speech, continue to be followed by SLP,  Hypernatremia -Resolved, sodium now normal  Acute kidney injury -Creatinine peaked as high as 2.39 on 04/24/2019, possibly multifactorial in the setting of diuresis and use of vancomycin -He was also found to have retention requiring Foley catheter placement. -At 1.3 today,   Acute urinary retention. -In the setting of ICU stay. Failed voiding trial on 8/20 and Foley had to be placed back.  He failed another voiding attempt yesterday, Foley catheter inserted earlier today, continue with Flomax.  ETT clotting/bleeding from the subclavian line -Patient was on therapeutic heparin, ET tube was clotted and exchange on 04/23/2019.  Currently on heparin prophylaxis -Hemoglobin stable  Thrombocytopenia -Chronic, stable  Type 2 diabetes mellitus, uncontrolled with hyperglycemia with vascular disease and neuropathy at baseline -CBGs fair, continue to monitor  CBG (last 3)  Recent Labs    05/14/19 0122 05/14/19 0403 05/14/19 0759  GLUCAP 348* 268* 141*   RA -He is on Actemra and methotrexate at home, currently on hold  Pressure ulcer of medial sacrum bilateral buttocks -Wound care service consulted, will follow recommendations Pressure Injury 04/17/19 Sacrum Medial Deep Tissue Injury - Purple or maroon localized area of discolored intact skin or blood-filled blister due to damage of underlying soft tissue from pressure and/or shear. dark purple with some skin sloughing on lowe (Active)  04/17/19 0730  Location: Sacrum  Location Orientation: Medial  Staging: Deep Tissue Injury - Purple or maroon localized area of discolored intact skin or blood-filled  blister due to damage of underlying soft tissue from pressure and/or shear.  Wound Description (Comments): dark purple with some skin sloughing on lower sacral area  Present on Admission: No     Pressure Injury 04/17/19 Buttocks Right;Lower Unstageable - Full thickness tissue loss in which the base of the ulcer is covered by slough (yellow, tan, gray, green or brown) and/or eschar (tan, brown or black) in the wound bed. (Active)  04/17/19 0730  Location: Buttocks  Location Orientation: Right;Lower  Staging: Unstageable - Full thickness tissue loss in which the base of the ulcer is covered by slough (yellow, tan, gray, green or brown) and/or eschar (tan, brown or black) in the wound bed.  Wound Description (Comments):   Present on Admission: No     Pressure Injury 04/17/19 Buttocks Left;Lower WOC updated to Stage 3 on 05/06/19 at the time of assessment (Active)  04/17/19 0730  Location: Buttocks  Location Orientation: Left;Lower  Staging:   Wound Description (Comments): WOC updated to Stage 3 on 05/06/19 at the time of assessment  Present on Admission: No     Scheduled Meds: . albuterol  2 puff Inhalation Q6H  . aspirin  324 mg Oral Once  . [START ON 05/15/2019] aspirin  81 mg Oral Daily  . bethanechol  10 mg Oral BID  . bisacodyl  10 mg Rectal Once  . bisacodyl  10 mg Rectal Once  . chlorhexidine  15 mL Mouth Rinse BID  . Chlorhexidine Gluconate Cloth  6 each Topical Daily  . clopidogrel  75 mg Oral Daily  . collagenase   Topical Daily  . feeding supplement (ENSURE ENLIVE)  237 mL Oral TID BM  . folic acid  1 mg Oral Daily  . gabapentin  100 mg Oral Q8H  . insulin aspart  0-15 Units Subcutaneous Q4H  . insulin glargine  15 Units Subcutaneous Daily  . LORazepam  1 mg Intravenous Once  . mouth rinse  15 mL Mouth Rinse q12n4p  . metoprolol tartrate  25 mg Oral TID  . metoprolol tartrate  2.5 mg Intravenous Once  . mometasone-formoterol  2 puff Inhalation BID  . multivitamin with  minerals  1 tablet Oral Daily  . polyethylene glycol  34 g Oral Once  . senna-docusate  3 tablet Oral BID  . sodium chloride flush  10-40 mL Intracatheter Q12H  . tamsulosin  0.8 mg Oral QPC supper   Continuous Infusions: . sodium chloride Stopped (05/05/19 1136)  . ampicillin-sulbactam (UNASYN) IV 3 g (05/14/19 0439)  . heparin 1,250 Units/hr (05/14/19 0834)   PRN Meds:.sodium chloride, acetaminophen, dextrose, hydrALAZINE, Resource ThickenUp Clear, sodium chloride flush, Thrombi-Pad   DVT prophylaxis: Lovenox Code Status: Full code Family Communication: Discussed with patient, will discuss with daughter today Disposition Plan: Need placement, likely will benefit from CIR. Consultants:   PCCM  Cardiology  Micro data: 7/31 blood >NGTD 7/31 sars-cov-2 >positive 8/1 Resp CX > Normal flora 8/7 Resp CX >MRSA 8/7 BCX > NGTD 8/8 UCX > NGTD 8/15 Resp culture>  MRSA    Objective: Vitals:   05/14/19 0615 05/14/19 0630 05/14/19 0746 05/14/19 0755  BP: 130/68 132/68    Pulse: 79 75    Resp: 18 18    Temp:   98 F (36.7 C)   TempSrc:   Oral   SpO2: 96% 98%  97%  Weight:      Height:        Intake/Output Summary (Last 24 hours) at 05/14/2019 1012 Last data filed at 05/14/2019 1000 Gross per 24 hour  Intake 430 ml  Output 2735 ml  Net -2305 ml   Filed Weights   05/05/19 0300 05/06/19 0500 05/07/19 0235  Weight: 104.2 kg 111 kg 106.1 kg    Examination:  Awake Alert, Oriented X 3, No new F.N deficits, in mild respiratory distress, pale Symmetrical Chest wall movement, increased work of breathing, mildly tachypneic, right lung rails RRR,No Gallops,Rubs or new Murmurs, No Parasternal Heave +ve B.Sounds, Abd Soft, No tenderness, No rebound - guarding or rigidity. No Cyanosis, Clubbing or edema, patient with bilateral gluteal/sacral pressure ulcer, please review pictures in chart.   Data Reviewed: I have independently reviewed following labs and imaging studies     CBC: Recent Labs  Lab 05/10/19 0650 05/11/19 0123 05/13/19 0206 05/13/19 2220 05/13/19 2305 05/14/19 0801  WBC 6.3 5.3 5.1 10.2  --  6.7  NEUTROABS  --   --   --  7.4  --   --   HGB 8.6* 8.8* 9.2* 10.7* 9.9* 8.9*  HCT 27.2* 27.5* 28.5* 34.1* 29.0* 27.7*  MCV 94.4 94.8 94.7 95.8  --  94.9  PLT 103* 94* 112* 175  --  [id number]   Basic Metabolic Panel: Recent Labs  Lab 05/10/19 0650 05/11/19 0123 05/13/19 0206 05/13/19 2220 05/13/19 2305 05/14/19 0801  NA 141 138 137 138 136 137  K 4.1 3.8 3.9 4.6 4.4 4.3  CL 103 99 98 99  --  97*  CO2 30 31 32 29  --  31  GLUCOSE 127* 173* 124* 356*  --  154*  BUN 31* 34* 25* 24*  --  26*  CREATININE 1.31* 1.29* 1.32* 1.36*  --  1.30*  CALCIUM 8.9 8.8* 8.9 9.3  --  9.3  MG  --   --   --  2.0  --   --    GFR: Estimated Creatinine Clearance: 70.4 mL/min (A) (by C-G formula based on SCr of 1.3 mg/dL (H)). Liver Function Tests: Recent Labs  Lab 05/09/19 0058 05/10/19 0650 05/11/19 0123 05/13/19 2220 05/14/19 0801  AST 26 28 33 36 31  ALT 53* 52* 58* 73* 64*  ALKPHOS 111 112 130* 187* 156*  BILITOT 0.2* 0.4 0.4 0.4 0.1*  PROT 6.0* 6.0* 6.3* 7.3 6.6  ALBUMIN 2.3* 2.2* 2.3* 2.6* 2.4*   No results for input(s): LIPASE, AMYLASE in the last 168 hours. No results for input(s): AMMONIA in the last 168 hours. Coagulation Profile: No results for input(s): INR, PROTIME in the last 168 hours. Cardiac Enzymes: No results for input(s): CKTOTAL, CKMB, CKMBINDEX, TROPONINI in the last 168 hours. BNP (last 3 results) No results for input(s): PROBNP in the last 8760 hours. HbA1C: No results for input(s): HGBA1C in the last 72 hours. CBG: Recent Labs  Lab 05/13/19 1213 05/13/19 2016 05/14/19 0122 05/14/19 0403 05/14/19 0759  GLUCAP 172* 305* 348* 268* 141*   Lipid Profile: No results for input(s): CHOL, HDL, LDLCALC, TRIG, CHOLHDL, LDLDIRECT in the last 72 hours. Thyroid Function Tests: No results for input(s): TSH, T4TOTAL, FREET4,  T3FREE, THYROIDAB in the last 72 hours. Anemia Panel: No results for input(s): VITAMINB12, FOLATE, FERRITIN, TIBC, IRON, RETICCTPCT in the last 72 hours. Urine analysis:    Component Value Date/Time   COLORURINE YELLOW 04/25/2019 0826   APPEARANCEUR HAZY (A) 04/25/2019 [id number]  LABSPEC 1.015 04/25/2019 0826   PHURINE 5.0 04/25/2019 0826   GLUCOSEU 150 (A) 04/25/2019 0826   HGBUR LARGE (A) 04/25/2019 0826   BILIRUBINUR NEGATIVE 04/25/2019 0826   KETONESUR NEGATIVE 04/25/2019 0826   PROTEINUR 30 (A) 04/25/2019 0826   UROBILINOGEN 0.2 12/22/2011 2130   NITRITE NEGATIVE 04/25/2019 0826   LEUKOCYTESUR NEGATIVE 04/25/2019 0826   Sepsis Labs: Invalid input(s): PROCALCITONIN, LACTICIDVEN  Recent Results (from the past 240 hour(s))  Novel Coronavirus, NAA (hospital order; send-out to ref lab)     Status: None   Collection Time: 05/06/19 10:16 AM   Specimen: Nasopharyngeal Swab; Respiratory  Result Value Ref Range Status   SARS-CoV-2, NAA NOT DETECTED NOT DETECTED Final    Comment: (NOTE) This test was developed and its performance characteristics determined by [employer]. This test has not been FDA cleared or approved. This test has been authorized by FDA under an Emergency Use Authorization (EUA). This test is only authorized for the duration of time the declaration that circumstances exist justifying the authorization of the emergency use of in vitro diagnostic tests for detection of SARS-CoV-2 virus and/or diagnosis of COVID-19 infection under section 564(b)(1) of the Act, 21 U.S.C. [id number]), unless the authorization is terminated or revoked sooner. When diagnostic testing is negative, the possibility of a false negative result should be considered in the context of a patient's recent exposures and the presence of clinical signs and symptoms consistent with COVID-19. An individual without symptoms of COVID-19 and who is not shedding SARS-CoV-2 virus would expect to  have a negative (not detected) result in this assay. Performed  At: [facility], [place], [place] [id number] Rush Farmer MD [id number]    [place]  Final    Comment: Performed at [place] [address] [place], [place] [address]  SARS Coronavirus 2 [facility] order, Performed in [facility] hospital lab)     Status: Abnormal   Collection Time: 05/07/19  5:23 PM  Result Value Ref Range Status   SARS Coronavirus 2 POSITIVE (A) NEGATIVE Final    Comment: CRITICAL RESULT CALLED TO, READ BACK BY AND VERIFIED WITH: RN T SCEGEN AT 2143 05/07/19 CRUICKSHANK A (NOTE) If result is NEGATIVE SARS-CoV-2 target nucleic acids are NOT DETECTED. The SARS-CoV-2 RNA is generally detectable in upper and lower  respiratory specimens during the acute phase of infection. The lowest  concentration of SARS-CoV-2 viral copies this assay can detect is 250  copies / mL. A negative result does not preclude SARS-CoV-2 infection  and should not be used as the sole basis for treatment or other  patient management decisions.  A negative result may occur with  improper specimen collection / handling, submission of specimen other  than nasopharyngeal swab, presence of viral mutation(s) within the  areas targeted by this assay, and inadequate number of viral copies  (<250 copies / mL). A negative result must be combined with clinical  observations, patient history, and epidemiological information. If result is POSITIVE SARS-CoV-2 target nucleic acids a re DETECTED. The SARS-CoV-2 RNA is generally detectable in upper and lower  respiratory specimens during the acute phase of infection.  Positive  results are indicative of active infection with SARS-CoV-2.  Clinical  correlation with patient history and other diagnostic information is  necessary to determine patient infection status.  Positive results do  not rule out bacterial infection or  co-infection with other viruses. If result is PRESUMPTIVE POSTIVE SARS-CoV-2 nucleic acids MAY BE PRESENT.  A presumptive positive result was obtained on the submitted specimen  and confirmed on repeat testing.  While 2019 novel coronavirus  (SARS-CoV-2) nucleic acids may be present in the submitted sample  additional confirmatory testing may be necessary for epidemiological  and / or clinical management purposes  to differentiate between  SARS-CoV-2 and other Sarbecovirus currently known to infect humans.  If clinically indicated additional testing with an alternate test  methodolo gy [phone]) is advised. The SARS-CoV-2 RNA is generally  detectable in upper and lower respiratory specimens during the acute  phase of infection. The expected result is Negative. Fact Sheet for Patients:  StrictlyIdeas.no Fact Sheet for Healthcare Providers: BankingDealers.co.za This test is not yet approved or cleared by the [place] FDA and has been authorized for detection and/or diagnosis of SARS-CoV-2 by FDA under an Emergency Use Authorization (EUA).  This EUA will remain in effect (meaning this test can be used) for the duration of the COVID-19 declaration under Section 564(b)(1) of the Act, 21 U.S.C. section 360bbb-3(b)(1), unless the authorization is terminated or revoked sooner. Performed at [facility] [place] [place] [address] [place], [place] [address]   Novel Coronavirus, NAA (hospital order; send-out to ref lab)     Status: None   Collection Time: 05/11/19  1:58 PM   Specimen: Nasopharyngeal Swab; Respiratory  Result Value Ref Range Status   SARS-CoV-2, NAA NOT DETECTED NOT DETECTED Final    Comment: (NOTE) This nucleic acid amplification test was developed and its perfomance characteristics determined by [employer]. Nucleic acid amplification tests include PCR and TMA. This test has not been FDA cleared or  approved. This test has been authorized by FDA under an Emergency Use Authorization (EUA). This test is only authorized for the duration of time the declaration that circumstances exist justifying the authorization of the emergency use of in vitro diagnostic tests for detection of SARS-CoV-2 virus and/or diagnosis of COVID-19 infection under section 564(b)(1) of the Act, 21 U.S.C. [id number]) (1), unless the authorization is terminated or revoked sooner. When diagnostic testing is negative, the possibility of a false negative result should be considered in the context of a patient's recent exposures and the presence of clinical signs and symptoms consistent with COVID-19. An individual without symptoms of COVID- 19 and who is not shedding SARS-CoV-2 vir [place] would expect to have a negative (not detected) result in this assay. Performed At: [facility] [place], [place] [id number] Rush Farmer MD [id number]    [place]  Final    Comment: Performed at [place] [address], [place], [place] [address]  MRSA PCR Screening     Status: Abnormal   Collection Time: 05/13/19 11:29 PM   Specimen: Nasopharyngeal  Result Value Ref Range Status   MRSA by PCR POSITIVE (A) NEGATIVE Final    Comment:        The GeneXpert MRSA Assay (FDA approved for NASAL specimens only), is one component of a comprehensive MRSA colonization surveillance program. It is not intended to diagnose MRSA infection nor to guide or monitor treatment for MRSA infections. CRITICAL RESULT CALLED TO, READ BACK BY AND VERIFIED WITH: MOSLEY, L. RN @0800  ON 9.2.2020 BY [facility] Performed at [place] [address] [place], [place] [address]      Radiology Studies: Dg Abd 1 View  Result Date: 05/13/2019 CLINICAL DATA:  Vomiting EXAM: ABDOMEN - 1 VIEW COMPARISON:  04/30/2019 FINDINGS: Nonobstructive pattern of bowel gas  with  scattered gas present to the rectum. Scattered stool in the colon and high density ingested material in the cecal base. No free air in the abdomen on supine radiographs. A previously seen enteric feeding tube and esophagogastric tube have been removed. IMPRESSION: Nonobstructive pattern of bowel gas with scattered gas present to the rectum. Scattered stool in the colon and high density ingested material in the cecal base. Electronically Signed   By: Eddie Candle M.D.   On: 05/13/2019 16:15   Dg Chest Port 1 View  Result Date: 05/13/2019 CLINICAL DATA:  Dyspnea. COVID-19 positive. EXAM: PORTABLE CHEST 1 VIEW COMPARISON:  04/25/2019 FINDINGS: Unchanged heart size with mild cardiomegaly. Unchanged mediastinal contours. Aortic atherosclerosis and coronary stent visualized. Opacification of the lower right hemithorax may represent combination of pleural fluid and airspace disease. Right costophrenic angle not included in the field of view. There is a left pleural effusion, increased from prior. Pulmonary and interstitial opacities edema has progressed. IMPRESSION: 1. Opacification of the lower right hemithorax may represent combination of pleural fluid and airspace disease, pneumonia versus aspiration. 2. Increased left pleural effusion since prior exam. Progressive interstitial opacities from prior likely combination of pulmonary edema and atypical pneumonia. Electronically Signed   By: Keith Rake M.D.   On: 05/13/2019 23:22     Phillips Climes MD [facility]  Contact via  [email or web address]  [place] P: [phone] F: [phone]

## 2019-05-15 ENCOUNTER — Inpatient Hospital Stay (HOSPITAL_COMMUNITY): Payer: Medicare HMO

## 2019-05-15 DIAGNOSIS — I5042 Chronic combined systolic (congestive) and diastolic (congestive) heart failure: Secondary | ICD-10-CM

## 2019-05-15 DIAGNOSIS — J15212 Pneumonia due to Methicillin resistant Staphylococcus aureus: Secondary | ICD-10-CM | POA: Diagnosis present

## 2019-05-15 DIAGNOSIS — J690 Pneumonitis due to inhalation of food and vomit: Secondary | ICD-10-CM | POA: Diagnosis present

## 2019-05-15 DIAGNOSIS — I5031 Acute diastolic (congestive) heart failure: Secondary | ICD-10-CM

## 2019-05-15 DIAGNOSIS — I214 Non-ST elevation (NSTEMI) myocardial infarction: Secondary | ICD-10-CM | POA: Diagnosis present

## 2019-05-15 DIAGNOSIS — A419 Sepsis, unspecified organism: Secondary | ICD-10-CM | POA: Diagnosis present

## 2019-05-15 LAB — CBC
HCT: 26.5 % — ABNORMAL LOW (ref 39.0–52.0)
HCT: 27.8 % — ABNORMAL LOW (ref 39.0–52.0)
Hemoglobin: 8.5 g/dL — ABNORMAL LOW (ref 13.0–17.0)
Hemoglobin: 8.9 g/dL — ABNORMAL LOW (ref 13.0–17.0)
MCH: 30 pg (ref 26.0–34.0)
MCH: 30.2 pg (ref 26.0–34.0)
MCHC: 32 g/dL (ref 30.0–36.0)
MCHC: 32.1 g/dL (ref 30.0–36.0)
MCV: 93.6 fL (ref 80.0–100.0)
MCV: 94.2 fL (ref 80.0–100.0)
Platelets: 132 10*3/uL — ABNORMAL LOW (ref 150–400)
Platelets: 147 10*3/uL — ABNORMAL LOW (ref 150–400)
RBC: 2.83 MIL/uL — ABNORMAL LOW (ref 4.22–5.81)
RBC: 2.95 MIL/uL — ABNORMAL LOW (ref 4.22–5.81)
RDW: 16.4 % — ABNORMAL HIGH (ref 11.5–15.5)
RDW: 16.5 % — ABNORMAL HIGH (ref 11.5–15.5)
WBC: 6.5 10*3/uL (ref 4.0–10.5)
WBC: 7.1 10*3/uL (ref 4.0–10.5)
nRBC: 0 % (ref 0.0–0.2)
nRBC: 0 % (ref 0.0–0.2)

## 2019-05-15 LAB — COMPREHENSIVE METABOLIC PANEL
ALT: 54 U/L — ABNORMAL HIGH (ref 0–44)
AST: 26 U/L (ref 15–41)
Albumin: 2.2 g/dL — ABNORMAL LOW (ref 3.5–5.0)
Alkaline Phosphatase: 140 U/L — ABNORMAL HIGH (ref 38–126)
Anion gap: 9 (ref 5–15)
BUN: 24 mg/dL — ABNORMAL HIGH (ref 8–23)
CO2: 30 mmol/L (ref 22–32)
Calcium: 8.9 mg/dL (ref 8.9–10.3)
Chloride: 100 mmol/L (ref 98–111)
Creatinine, Ser: 1.31 mg/dL — ABNORMAL HIGH (ref 0.61–1.24)
GFR calc Af Amer: >60 mL/min (ref >60)
GFR calc non Af Amer: 56 mL/min — ABNORMAL LOW (ref >60)
Glucose, Bld: 196 mg/dL — ABNORMAL HIGH (ref 70–99)
Potassium: 4.1 mmol/L (ref 3.5–5.1)
Sodium: 139 mmol/L (ref 135–145)
Total Bilirubin: 0.4 mg/dL (ref 0.3–1.2)
Total Protein: 6.4 g/dL — ABNORMAL LOW (ref 6.5–8.1)

## 2019-05-15 LAB — GLUCOSE, CAPILLARY
Glucose-Capillary: 202 mg/dL — ABNORMAL HIGH (ref 70–99)
Glucose-Capillary: 218 mg/dL — ABNORMAL HIGH (ref 70–99)
Glucose-Capillary: 229 mg/dL — ABNORMAL HIGH (ref 70–99)
Glucose-Capillary: 240 mg/dL — ABNORMAL HIGH (ref 70–99)
Glucose-Capillary: 253 mg/dL — ABNORMAL HIGH (ref 70–99)
Glucose-Capillary: 276 mg/dL — ABNORMAL HIGH (ref 70–99)

## 2019-05-15 LAB — HEPARIN LEVEL (UNFRACTIONATED)
Heparin Unfractionated: 0.11 IU/mL — ABNORMAL LOW (ref 0.30–0.70)
Heparin Unfractionated: 0.17 IU/mL — ABNORMAL LOW (ref 0.30–0.70)

## 2019-05-15 LAB — NOVEL CORONAVIRUS, NAA (HOSP ORDER, SEND-OUT TO REF LAB; TAT 18-24 HRS): SARS-CoV-2, NAA: NOT DETECTED

## 2019-05-15 LAB — PROCALCITONIN: Procalcitonin: 0.7 ng/mL

## 2019-05-15 LAB — TROPONIN I (HIGH SENSITIVITY): Troponin I (High Sensitivity): 338 ng/L (ref <18)

## 2019-05-15 MED ORDER — INSULIN ASPART 100 UNIT/ML ~~LOC~~ SOLN
0.0000 [IU] | SUBCUTANEOUS | Status: DC
  Administered 2019-05-15: 16:00:00 11 [IU] via SUBCUTANEOUS
  Administered 2019-05-15: 7 [IU] via SUBCUTANEOUS
  Administered 2019-05-16: 01:00:00 11 [IU] via SUBCUTANEOUS
  Administered 2019-05-16: 15 [IU] via SUBCUTANEOUS
  Administered 2019-05-16 (×2): 3 [IU] via SUBCUTANEOUS
  Administered 2019-05-16: 20 [IU] via SUBCUTANEOUS

## 2019-05-15 MED ORDER — HEPARIN (PORCINE) 25000 UT/250ML-% IV SOLN: 1750.0000 [IU]/h | INTRAVENOUS | Status: DC

## 2019-05-15 MED ORDER — INSULIN GLARGINE 100 UNIT/ML ~~LOC~~ SOLN
22.0000 [IU] | Freq: Every day | SUBCUTANEOUS | Status: DC
  Administered 2019-05-16 – 2019-05-18 (×3): 22 [IU] via SUBCUTANEOUS
  Filled 2019-05-15 (×3): qty 0.22

## 2019-05-15 MED ORDER — FUROSEMIDE 10 MG/ML IJ SOLN
40.0000 mg | Freq: Two times a day (BID) | INTRAMUSCULAR | Status: DC
  Administered 2019-05-15 – 2019-05-16 (×2): 40 mg via INTRAVENOUS
  Filled 2019-05-15 (×2): qty 4

## 2019-05-15 NOTE — Progress Notes (Signed)
Patient unable to void. Bladder scanned patient 383 residual. Notified Dr. Sherral Hammers regarding bladder scan results. Per Dr. Sherral Hammers verbal order placed Foley Cath for urinary retention.  Jenny Reichmann, RN and [place], RN placed urinary catheter.

## 2019-05-15 NOTE — Progress Notes (Signed)
Progress Note  Patient Name: Robert [name] Date of Encounter: 05/15/2019  Primary Cardiologist: Sanda Klein, MD   Subjective   The patient denies any chest pain, his SOB is stable, he doesn't feel any changes since yesterday.  Inpatient Medications    Scheduled Meds: . albuterol  2 puff Inhalation Q6H  . aspirin  81 mg Oral Daily  . atorvastatin  80 mg Oral q1800  . bethanechol  10 mg Oral BID  . bisacodyl  10 mg Rectal Once  . bisacodyl  10 mg Rectal Once  . Chlorhexidine Gluconate Cloth  6 each Topical Daily  . clopidogrel  75 mg Oral Daily  . collagenase   Topical Daily  . feeding supplement (ENSURE ENLIVE)  237 mL Oral TID BM  . folic acid  1 mg Intravenous Daily  . furosemide  80 mg Intravenous Once  . furosemide  80 mg Intravenous BID  . gabapentin  100 mg Oral Q8H  . insulin aspart  0-15 Units Subcutaneous Q4H  . insulin glargine  15 Units Subcutaneous Daily  . LORazepam  1 mg Intravenous Once  . mouth rinse  15 mL Mouth Rinse BID  . metoprolol tartrate  50 mg Oral BID  . mometasone-formoterol  2 puff Inhalation BID  . multivitamin with minerals  1 tablet Oral Daily  . polyethylene glycol  34 g Oral Once  . senna-docusate  3 tablet Oral BID  . sodium chloride flush  10-40 mL Intracatheter Q12H  . tamsulosin  0.8 mg Oral QPC supper   Continuous Infusions: . sodium chloride Stopped (05/05/19 1136)  . ampicillin-sulbactam (UNASYN) IV 3 g (05/15/19 1238)   PRN Meds: sodium chloride, acetaminophen, dextrose, hydrALAZINE, Resource ThickenUp Clear, sodium chloride flush, Thrombi-Pad   Vital Signs    Vitals:   05/15/19 0700 05/15/19 0758 05/15/19 0900 05/15/19 1107  BP: 138/78  107/63   Pulse: 82   95  Resp: 18   (!) 24  Temp:   98.2 F (36.8 C)   TempSrc:   Oral   SpO2: 94% 92%  93%  Weight:      Height:        Intake/Output Summary (Last 24 hours) at 05/15/2019 1251 Last data filed at 05/15/2019 0700 Gross per 24 hour  Intake 1412.86 ml  Output  3968 ml  Net -2555.14 ml   Last 3 Weights 05/15/2019 05/07/2019 05/06/2019  Weight (lbs) (No Data) 233 lb 14.5 oz 244 lb 11.4 oz  Weight (kg) (No Data) 106.1 kg 111 kg      ECG    No new tracing- Personally Reviewed  Physical Exam   GEN: No acute distress.   Neck: No JVD Cardiac: RRR, no murmurs, rubs, or gallops.  Respiratory: Clear to auscultation bilaterally. GI: Soft, nontender, non-distended  MS: No edema; No deformity. Neuro:  Nonfocal  Psych: Normal affect   Labs    High Sensitivity Troponin:   Recent Labs  Lab 05/14/19 0210 05/14/19 0801 05/14/19 1655 05/14/19 2020 05/15/19 0610  TROPONINIHS 221* 1,269* 675* 480* 338*     Chemistry Recent Labs  Lab 05/13/19 2220 05/13/19 2305 05/14/19 0801 05/15/19 0610  NA 138 136 137 139  K 4.6 4.4 4.3 4.1  CL 99  --  97* 100  CO2 29  --  31 30  GLUCOSE 356*  --  154* 196*  BUN 24*  --  26* 24*  CREATININE 1.36*  --  1.30* 1.31*  CALCIUM 9.3  --  9.3  8.9  PROT 7.3  --  6.6 6.4*  ALBUMIN 2.6*  --  2.4* 2.2*  AST 36  --  31 26  ALT 73*  --  64* 54*  ALKPHOS 187*  --  156* 140*  BILITOT 0.4  --  0.1* 0.4  GFRNONAA 54*  --  57* 56*  GFRAA >60  --  >60 >60  ANIONGAP 10  --  9 9    Hematology Recent Labs  Lab 05/14/19 0801 05/14/19 2340 05/15/19 0610  WBC 6.7 7.1 6.5  RBC 2.92* 2.95* 2.83*  HGB 8.9* 8.9* 8.5*  HCT 27.7* 27.8* 26.5*  MCV 94.9 94.2 93.6  MCH 30.5 30.2 30.0  MCHC 32.1 32.0 32.1  RDW 16.2* 16.5* 16.4*  PLT 127* 132* 147*   BNP Recent Labs  Lab 05/14/19 0801  BNP 383.8*    DDimer  Recent Labs  Lab 05/13/19 2220  DDIMER 3.55*    Radiology    Dg Abd 1 View  Result Date: 05/13/2019 CLINICAL DATA:  Vomiting EXAM: ABDOMEN - 1 VIEW COMPARISON:  04/30/2019 FINDINGS: Nonobstructive pattern of bowel gas with scattered gas present to the rectum. Scattered stool in the colon and high density ingested material in the cecal base. No free air in the abdomen on supine radiographs. A previously  seen enteric feeding tube and esophagogastric tube have been removed. IMPRESSION: Nonobstructive pattern of bowel gas with scattered gas present to the rectum. Scattered stool in the colon and high density ingested material in the cecal base. Electronically Signed   By: Eddie Candle M.D.   On: 05/13/2019 16:15   Dg Chest Port 1 View  Result Date: 05/13/2019 CLINICAL DATA:  Dyspnea. COVID-19 positive. EXAM: PORTABLE CHEST 1 VIEW COMPARISON:  04/25/2019 FINDINGS: Unchanged heart size with mild cardiomegaly. Unchanged mediastinal contours. Aortic atherosclerosis and coronary stent visualized. Opacification of the lower right hemithorax may represent combination of pleural fluid and airspace disease. Right costophrenic angle not included in the field of view. There is a left pleural effusion, increased from prior. Pulmonary and interstitial opacities edema has progressed. IMPRESSION: 1. Opacification of the lower right hemithorax may represent combination of pleural fluid and airspace disease, pneumonia versus aspiration. 2. Increased left pleural effusion since prior exam. Progressive interstitial opacities from prior likely combination of pulmonary edema and atypical pneumonia. Electronically Signed   By: Keith Rake M.D.   On: 05/13/2019 23:22   Cardiac Studies    Patient Profile     68 y.o. male   Assessment & Plan    1. Elevated troponin in patient with known CAD 2. Acute on chronic combined CHF 3. HTN 4. PAD 5. 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  [facility]. Complicated history as above, admitted on ED 04/11/2019 - diagnosed with Covid and has been dealing complications of Covid related pneumonia/MRSA pneumonia/septic shock/respiratory failure, intubated 3x in the course of a month. He was extubated 05/03/2019 and unfortunately developed worsening respiratory failure with increased work of breathing and was transferred back to the ICU after experiencing acute onset diaphoresis, tachycardia, tachypnea, and confusion. Differential included aspiration, PE, or cardiac event.  CXR showed worsening RLL opacity c/f aspiration PNA. HE had no complaints of chest pain at that time. Troponins 221>1269. EKG with sinus rhythm with nonspecific T wave abnormalities, no STE/D, unchanged from the prior done on 04/30/2019. Echo with EF 50-55%, G3DD, moderate basal septal hypertrophy, poor acoustical windows so unable to determine RWMA, moderate MR, and mildly dilated LA, unchanged from prior. I have reviewed his echocardiogram, he has LVEF 50-55% with no regional wall motion abnormalities, he has grade 3 diastolic dysfunction with significantly elevated filling pressures. His CXR from yesterday shows pulmonary edema vs infiltrates, B/L pleural effusion, he was started on lasix 80 mg iv BID.  I have spoken with the patient over the camera in his room, he appears comfortable, denies any chest pain or SOB, he has no LE edema.   He was started on lasix 80 mg iv BID diuresed 2.3 L overnight.  His electrolytes and creatinine are stable.  He is stable, denies any symptoms changes, still some SOB, no chest pain. weights are not available as his bed scale is broken, I will obtain a chest x-ray, decrease lasix to 40 mg iv BID. His troponin is now downtrending and heparin can be discontinued.  Elevated troponin most probably sec to CHF and hypoxia during recurrent resp failure/aspiration. Conservative management is recommended in the absence of symptoms and no new wall motion  abnormalities.  [facility] will sign off.   Medication Recommendations:  As above Other recommendations (labs, testing, etc):  CXR Follow up as an outpatient:  As needed  For questions or updates, please contact [facility] Please consult [email or web address] for contact info under    Signed, Ena Dawley, MD  05/15/2019, 12:51 PM

## 2019-05-15 NOTE — Progress Notes (Signed)
Patient called his family members and updated them on his condition.  He face timed his daughter while I was in the room. He gave her updates on his plan of care.  His daughter did not have any questions for me. Patient stated he keeps in close contact with his children about his condition and care.

## 2019-05-15 NOTE — Plan of Care (Signed)
  Problem: Education: Goal: Knowledge of General Education information will improve Description: Including pain rating scale, medication(s)/side effects and non-pharmacologic comfort measures Outcome: Progressing   Problem: Clinical Measurements: Goal: Ability to maintain clinical measurements within normal limits will improve Outcome: Progressing Goal: Will remain free from infection Outcome: Progressing Goal: Respiratory complications will improve Outcome: Progressing Goal: Cardiovascular complication will be avoided Outcome: Progressing   Problem: Activity: Goal: Risk for activity intolerance will decrease Outcome: Progressing   Problem: Nutrition: Goal: Adequate nutrition will be maintained Outcome: Progressing   Problem: Coping: Goal: Level of anxiety will decrease Outcome: Progressing   Problem: Pain Managment: Goal: General experience of comfort will improve Outcome: Progressing   Problem: Safety: Goal: Ability to remain free from injury will improve Outcome: Progressing   Problem: Education: Goal: Knowledge of risk factors and measures for prevention of condition will improve Outcome: Progressing   Problem: Coping: Goal: Psychosocial and spiritual needs will be supported Outcome: Progressing   Problem: Respiratory: Goal: Will maintain a patent airway Outcome: Progressing

## 2019-05-15 NOTE — Progress Notes (Signed)
  Speech Language Pathology Treatment: Dysphagia  Patient Details Name: Robert [name] MRN: [id number] DOB: 1951-03-10 Today's Date: 05/15/2019 Time: 0832-0908 SLP Time Calculation (min) (ACUTE ONLY): 36 min  Assessment / Plan / Recommendation Clinical Impression  Pt was seen this morning with his breakfast tray, unhappy with current diet textures but also acknowledging and accepting of rationale. He needs Min cues to slow his pacing, especially with solids as he tries to take additional bites before swallowing what is in his mouth. He does at times take large sips with honey thick liquids. An occasional immediate cough is noted after moments of impulsivity, but when he takes slow, small bites and sips he has no coughing. His voice remains stable and he even has minimal delayed throat clearing compared to this SLP's previous visits. Given the above as well as what appears to be improving respiratory status compared to previous date, recommend maintaining current, more conservative diet as he appears to be better tolerating it. He would benefit from careful monitoring and possibly instrumental swallow study before diet progression occurs.    HPI HPI: 68 year old male with history of COPD, CAD, diabetes mellitus, ischemic cardiomyopathy, RA, presented to the hospital and was admitted on 04/11/2019 with shortness of breath, he was found to be significantly hypoxic, tested positive for COVID-19, was intubated and transferred to [facility] for further management. Intubated 8/1-8/2, reintubated 8/3-8/22.       SLP Plan  Continue with current plan of care       Recommendations  Diet recommendations: Dysphagia 1 (puree);Honey-thick liquid Liquids provided via: Cup;No straw Medication Administration: Whole meds with puree Supervision: Patient able to self feed Compensations: Multiple dry swallows after each bite/sip;Clear throat intermittently Postural Changes and/or Swallow Maneuvers: Seated upright 90  degrees                Oral Care Recommendations: Oral care BID Follow up Recommendations: Inpatient Rehab SLP Visit Diagnosis: Dysphagia, oropharyngeal phase (R13.12) Plan: Continue with current plan of care       GO                Venita Sheffield Aslin Farinas 05/15/2019, 9:28 AM  Pollyann Glen, M.A. [place] [facility] Environmental education officer [phone] Office [phone]

## 2019-05-15 NOTE — Progress Notes (Signed)
ANTICOAGULATION CONSULT NOTE - [place] for heparin Indication: chest pain/ACS  Allergies  Allergen Reactions  . Ivp Dye [Iodinated Diagnostic Agents] Itching and Rash    Patient Measurements: Height: 6' (182.9 cm) Weight: 233 lb 14.5 oz (106.1 kg) IBW/kg (Calculated) : 77.6 Heparin Dosing Weight: 99 kg   Vital Signs: Temp: 98.1 F (36.7 C) (09/03 0000) Temp Source: Oral (09/03 0000) BP: 122/73 (09/03 0300) Pulse Rate: 90 (09/03 0300)  Labs: Recent Labs    05/13/19 0206 05/13/19 2220 05/13/19 2305  05/14/19 0801 05/14/19 1504 05/14/19 1655 05/14/19 2020 05/14/19 2340  HGB 9.2* 10.7* 9.9*  --  8.9*  --   --   --  8.9*  HCT 28.5* 34.1* 29.0*  --  27.7*  --   --   --  27.8*  PLT 112* 175  --   --  127*  --   --   --  132*  HEPARINUNFRC  --   --   --   --   --  <0.10*  --   --  0.11*  CREATININE 1.32* 1.36*  --   --  1.30*  --   --   --   --   TROPONINIHS  --  14  --    < > 1,269*  --  675* 480*  --    < > = values in this interval not displayed.    Estimated Creatinine Clearance: 70.4 mL/min (A) (by C-G formula based on SCr of 1.3 mg/dL (H)).   Medical History: Past Medical History:  Diagnosis Date  . Anxiety   . COPD (chronic obstructive pulmonary disease) ([place])   . Coronary artery disease   . Full dentures   . GERD (gastroesophageal reflux disease)    Rolaids as needed  . High cholesterol   . History of MI (myocardial infarction)   . Hypertension    med. dosage increased 04/2016; has been on BP med. x 10 yrs.  . Incarcerated umbilical hernia 0000000  . Inguinal hernia 05/2016   bilateral   . Insulin dependent diabetes mellitus ([place])   . Ischemic cardiomyopathy    a. 03/2015 EF 40-45% by LV gram.  . Left leg cellulitis 10/08/2016  . Rheumatoid arthritis(714.0)     Medications:  Medications Prior to Admission  Medication Sig Dispense Refill Last Dose  . acarbose (PRECOSE) 100 MG tablet Take 100 mg by mouth 3 (three) times  daily with meals.    unknown  . acetaminophen (TYLENOL) 650 MG CR tablet Take 650 mg by mouth every 8 (eight) hours as needed for pain.   unknown  . ALPRAZolam (XANAX) 0.5 MG tablet Take 0.5 mg by mouth 3 (three) times daily as needed for anxiety.    unknown  . AMBULATORY NON FORMULARY MEDICATION Take 600 mg by mouth daily at 12 noon. Medication Name: Dalcetrapib vs placebo (Patient taking differently: Take 600 mg by mouth every evening. Medication Name: Dalcetrapib vs placebo: Cholesterol)   unknown  . aspirin EC 81 MG EC tablet Take 1 tablet (81 mg total) by mouth daily.   unknown  . atorvastatin (LIPITOR) 80 MG tablet TAKE 1 TABLET DAILY AT 6 PM. (Patient taking differently: Take 80 mg by mouth daily at 6 PM. ) 90 tablet 3 unknown  . clopidogrel (PLAVIX) 75 MG tablet TAKE (1) TABLET BY MOUTH ONCE DAILY. 90 tablet 0 unknown  . clotrimazole-betamethasone (LOTRISONE) cream Apply 1 application topically 2 (two) times daily.   unknown  .  fish oil-omega-3 fatty acids 1000 MG capsule Take 1 g by mouth daily.    unknown  . folic acid (FOLVITE) 1 MG tablet Take 1 mg by mouth daily.    unknown  . furosemide (LASIX) 40 MG tablet Take 80mg  po bid for 5 days then 80mg  po daily (Patient taking differently: Take 80 mg by mouth daily. ) 180 tablet 3 unknown  . gabapentin (NEURONTIN) 300 MG capsule Take 300 mg by mouth 3 (three) times daily as needed (for pain).    unknown  . glipiZIDE (GLUCOTROL) 10 MG tablet Take 10 mg by mouth 2 (two) times daily.   unknown  . Insulin Glargine (TOUJEO SOLOSTAR [place]) Inject 80 Units into the skin daily.   unknown  . isosorbide mononitrate (IMDUR) 60 MG 24 hr tablet Take 1 tablet (60 mg total) by mouth daily. 90 tablet 1 unknown  . losartan (COZAAR) 25 MG tablet Take 0.5 tablets (12.5 mg total) by mouth daily. 30 tablet 6 unknown  . metFORMIN (GLUCOPHAGE) 1000 MG tablet Take 1 tablet (1,000 mg total) by mouth 2 (two) times daily with a meal. (Patient taking differently: Take  1,000-1,250 mg by mouth See admin instructions. Take 1500 mg by mouth in the morning and 1000 mg in the evening)   unknown  . methotrexate 50 MG/2ML injection Inject 2 mLs into the skin once a week. Takes every Thursday   unknown  . metoprolol tartrate (LOPRESSOR) 25 MG tablet TAKE 1 TABLET TWICE DAILY. KEEP JULY APPOINTMENT. (Patient taking differently: Take 12.5 mg by mouth 2 (two) times daily. ) 180 tablet 0 unknown  . nitroGLYCERIN (NITROSTAT) 0.4 MG SL tablet Place 1 tablet (0.4 mg total) under the tongue every 5 (five) minutes x 3 doses as needed for chest pain. 25 tablet 2 unknown  . potassium chloride SA (K-DUR) 20 MEQ tablet 1 TABLET BY MOUTH DAILY. 30 tablet 1 unknown  . predniSONE (DELTASONE) 5 MG tablet Take 5 mg by mouth as directed. Filled on 04-10-19 # 48 dose pak ...   unknown  . primidone (MYSOLINE) 50 MG tablet Take 100 mg by mouth daily.    unknown  . Tocilizumab 162 MG/0.9ML SOSY Inject 162 mg into the skin every Monday. ACTEMRA   unknown    Assessment: 84 YOM with prolonged COVID-19 associated hospital stay now with acute change in mental status and bump in troponin concerning for ACS. Pharmacy consulted to start IV heparin. Of note, he has been on prophylactic Lovenox dosing with last dose given yesterday afternoon. He also has had issues with bleeding from endotracheal tube while he was intubated.   H/H remains low stable. Plt 175>127k. SCr 1.3  PM update: HL this AM is 0.11, subtherapeutic. RN reports that pt was not able to urinate and there could not check if there was blood in urine. However, there was some blood from meatus when pt was condom cathed.  - Hgb, plt stable    Goal of Therapy:  Heparin level 0.3-0.5 units/ml Monitor platelets by anticoagulation protocol: Yes   Plan:  -No bolus -Increase heparin infusion to 1750 units/hr -F/u 6 hr HL -Monitor daily HL, CBC and s/s of bleeding   Royetta Asal, PharmD, BCPS 05/15/2019 3:48 AM

## 2019-05-15 NOTE — Progress Notes (Signed)
Inpatient Diabetes Program Recommendations  AACE/ADA: New Consensus Statement on Inpatient Glycemic Control (2015)  Target Ranges:  Prepandial:   less than 140 mg/dL      Peak postprandial:   less than 180 mg/dL (1-2 hours)      Critically ill patients:  140 - 180 mg/dL   Lab Results  Component Value Date   GLUCAP 229 (H) 05/15/2019   HGBA1C 7.4 (H) 04/11/2019    Review of Glycemic Control  Diabetes history: DM2 Outpatient Diabetes medications: Precose 100 mg tid, glipizide 10 mg bid, Toujeo 80 units QD, metformin 1500 in am and 1000 mg QPM Current orders for Inpatient glycemic control: Lantus 15 units QHS, Novolog 0-15 units Q4H.   HgbA1C - 7.4% Blood sugars > 180 mg/dL. May benefit from insulin titration.  Inpatient Diabetes Program Recommendations:     Increase Lantus to 20 units QD. May need mc insulin if eating > 50% meal.  Will follow closely.  Thank you. Lorenda Peck, RD, LDN, CDE Inpatient Diabetes Coordinator [phone]

## 2019-05-15 NOTE — Progress Notes (Addendum)
PROGRESS NOTE    Robert [name]  [id number] DOB: 1950-09-12 DOA: 04/11/2019 PCP: Redmond School, MD   Brief Narrative:  68 year old WM PMHx COPD, CAD, diabetes mellitus, ischemic cardiomyopathy, RA,   Presented to the hospital and was admitted on 04/11/2019 with shortness of breath, he was found to be significantly hypoxic, tested positive for COVID-19, was intubated and transferred to [facility] for further management.  Hospital course complicated by persistent encephalopathy and difficulty weaning due to tachypnea and agitation, he failed extubation once on 8/2.  Hospital course was also complicated by MRSA pneumonia with septic shock at the beginning of August status post completed treatment. Patient eventually was eventually successfully extubated on 8/22 and transferred to the floor on 8/24   Subjective: 9/3 A/O x4, negative CP, negative S OB, negative abdominal pain.  Patient unable to void yesterday x2.  In and out cath x2 yielded bloody/bloody clot urine.  States does not want to have a Foley placed again unless absolutely necessary.  Afebrile overnight   Assessment & Plan:   Principal Problem:   Pneumonia due to COVID-19 virus Active Problems:   Rheumatoid arthritis ([place])   Hyperlipidemia   COPD (chronic obstructive pulmonary disease) (HCC)   Controlled type 2 diabetes mellitus with diabetic peripheral angiopathy without gangrene, with long-term current use of insulin (HCC)   Essential hypertension   Chronic combined systolic and diastolic CHF (congestive heart failure) (HCC)   Ischemic cardiomyopathy   Coronary artery disease   Dyslipidemia (high LDL; low HDL)   Acute respiratory failure with hypoxemia (HCC)   COVID-19 virus detected   Acute respiratory distress syndrome (ARDS) due to COVID-19 virus   Atrial fibrillation with RVR (HCC)   COPD (chronic obstructive pulmonary disease) with emphysema (HCC)   AKI (acute kidney injury) ([place])   Diabetes mellitus type 2,  uncontrolled, with complications (HCC)   Pressure injury of skin   Staphylococcal pneumonia ([place])   HCAP (healthcare-associated pneumonia)   Aspiration pneumonia of both lower lobes due to gastric secretions (HCC)   MRSA pneumonia (HCC)   Severe sepsis with septic shock (HCC)   NSTEMI (non-ST elevated myocardial infarction) ([place])   Acute Hypoxic Respiratory Failure with hypoxia/ Covid-19 pneumonia/ COPD exacerbation -Patient continues to smoke 1.5 PPD until admission to the hospital - Titrate O2 to maintain SPO2 89 to 93% - Currently on 8 L/min O2, SPO2 92% - Completed course of Remdesivir - Completed course of Decadron/Solu-Medrol - Completed Actemra 8/1 - Flutter valve - Albuterol QID - PT/OT consult pending  Aspiration pneumonia -9/1 PCXR shows continued aspiration pneumonia see below -Complete 5-day course of Unasyn -Speech evaluated patient dysphagia 1 diet with honey thick liquid.  MRSA /septic shock -Off pressors - Completed course antibiotic  NSTEMI/CAD - Hx CAD -Known chronic total occlusion of RCA, 60% stenosis proximal LAD and 60% stenosis ostial first diagonal artery. 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 -Patient's troponin troponin trending up significantly [phone], -Medical management per cardiology -Strict in and out -17.4 L -Daily weight Filed Weights   05/05/19 0300 05/06/19 0500 05/07/19 0235  Weight: 104.2 kg 111 kg 106.1 kg  - Lasix 80 mg BID - Hydralazine PRN - Metoprolol 50 mg BID  Acute on chronic systolic and diastolic CHF - See NSTEMI  Paroxysmal atrial fib with RVR -Currently NSR - See NSTEMI  Diabetes type 2 uncontrolled with complication (neuropathy, vascular disease)  - 7/31 hemoglobin A1c= 7.4 -9/3 increase Lantus 22 units -9/3 increase resistant SSI  Deconditioning/ICU induced muscle wasting -  9/3 PT OT consult pending  Hypernatremia -Resolved  Acute kidney injury (baseline Cr 0.80) Lab Results   Component Value Date   CREATININE 1.31 (H) 05/15/2019   CREATININE 1.30 (H) 05/14/2019   CREATININE 1.36 (H) 05/13/2019  - Not yet back to baseline  Acute  urinary retention - Patient has had multiple voiding trial failures.  Last one was 9/2 overnight. - Patient with in and out cath x2 overnight bloody with blood clots. - Heparin has been DC'd, reinsert Foley if urine does not clear will require urology consult - Continue Flomax 0.8 mg daily  ETT clotting/bleeding from subclavian line -ET tube clotted exchanged on 8/12 -Resolved s/p discontinuation of heparin on 9/3  Thrombocytopenia - Improving  RA -He is on Actemra and methotrexate at home, currently on hold  Pressure ulcer of medial sacrum bilateral buttocks -Wound care service consulted, will follow recommendations Pressure Injury 04/17/19 Sacrum Medial Deep Tissue Injury - Purple or maroon localized area of discolored intact skin or blood-filled blister due to damage of underlying soft tissue from pressure and/or shear. dark purple with some skin sloughing on lowe (Active)  04/17/19 0730  Location: Sacrum  Location Orientation: Medial  Staging: Deep Tissue Injury - Purple or maroon localized area of discolored intact skin or blood-filled blister due to damage of underlying soft tissue from pressure and/or shear.  Wound Description (Comments): dark purple with some skin sloughing on lower sacral area  Present on Admission: No     Pressure Injury 04/17/19 Buttocks Right;Lower Unstageable - Full thickness tissue loss in which the base of the ulcer is covered by slough (yellow, tan, gray, green or brown) and/or eschar (tan, brown or black) in the wound bed. (Active)  04/17/19 0730  Location: Buttocks  Location Orientation: Right;Lower  Staging: Unstageable - Full thickness tissue loss in which the base of the ulcer is covered by slough (yellow, tan, gray, green or brown) and/or eschar (tan, brown or black) in the wound bed.   Wound Description (Comments):   Present on Admission: No     Pressure Injury 04/17/19 Buttocks Left;Lower WOC updated to Stage 3 on 05/06/19 at the time of assessment (Active)  04/17/19 0730  Location: Buttocks  Location Orientation: Left;Lower  Staging:   Wound Description (Comments): WOC updated to Stage 3 on 05/06/19 at the time of assessment  Present on Admission: No    Goals of care - 9/3 PT/OT consult.  Patient with ischemic cardiomyopathy, s/p COVID pneumonia evaluate for SNF.  Work with patient daily.      DVT prophylaxis: SCD Code Status: Full Family Communication: 9/3 spoke with Dortha Kern (daughter) counseled her on plan of care.  Answered all questions Disposition Plan: TBD   Consultants:  PCCM     Procedures/Significant Events:  8/1 admission to [facility] ICU 8/2 extubated 8/3 early AM agitated, placed on BIPAP, required re-intubation 8/4 started on steroids for COPD exacerbation 8/8 septic shock requiring pressor support 8/9 off pressors, respiratory cultures +s.aureus 8/10 persistent encephalopathy off sedation, CT head ordered 8/11 encephalopathy improved 8/12 difficulty weaning due to tachypnea, ETT exchanged due to clot burden 8/13 PICC line inserted and subclavian central line removed 8/14 Antibiotics coverage broaden due to persistent fever and resp distress, pan cultured  8/19 more alert, tolerating pressure support 8/20 failed voiding trial, Foley needed to be reinserted 8/22 successfully extubated  8/24 transfer to the floor 9/1  transferred to ICU given worsening respiratory failure, increased work of breathing 9/1 PCXR:-Opacification of the lower right  hemithorax may represent combination of pleural fluid and airspace disease, pneumonia vs aspiration. -Increased left pleural effusion since prior exam.  -Progressive interstitial opacities from prior likely combination of pulmonary edema and atypical pneumonia. 9/2 echocardiogram: LVEF =  50-55%.-mildly dilated. moderate basal septal hypertrophy.  -Left ventricular diastolic Doppler parameters are consistent with restrictive filling.  Mitral valve regurgitation: mild -moderate .   I have personally reviewed and interpreted all radiology studies and my findings are as above.  VENTILATOR SETTINGS: Nasal cannula 2 L/min SPO2 93%   Cultures 7/31 blood negative final 7/31 RIGHT arm negative 7/31 urine negative 8/1 tracheal aspirate respiratory flora 8/7 blood right AC negative 8/7 blood RIGHT hand negative 8/7 tracheal aspirate positive MRSA 8/8 urine negative 8/14 blood LEFT hand negative 8/14 blood RIGHT hand negative 8/15 tracheal aspirate positive MRSA 9/1 MRSA by PCR positive      Antimicrobials: Anti-infectives (From admission, onward)   Start     Stop   05/14/19 0430  Ampicillin-Sulbactam (UNASYN) 3 g in sodium chloride 0.9 % 100 mL IVPB         04/30/19 2200  vancomycin (VANCOCIN) 1,500 mg in sodium chloride 0.9 % 500 mL IVPB     05/03/19 1900   04/29/19 0900  vancomycin (VANCOCIN) 1,500 mg in sodium chloride 0.9 % 500 mL IVPB  Status:  Discontinued     04/29/19 2324   04/27/19 1800  ceFEPIme (MAXIPIME) 2 g in sodium chloride 0.9 % 100 mL IVPB  Status:  Discontinued     04/29/19 1316   04/26/19 1630  vancomycin (VANCOCIN) 1,750 mg in sodium chloride 0.9 % 500 mL IVPB  Status:  Discontinued     04/29/19 0847   04/25/19 1400  vancomycin (VANCOCIN) IVPB 1000 mg/200 mL premix     04/25/19 1456   04/25/19 1300  ceFEPIme (MAXIPIME) 2 g in sodium chloride 0.9 % 100 mL IVPB  Status:  Discontinued     04/27/19 1319   04/23/19 1921  vancomycin variable dose per unstable renal function (pharmacist dosing)  Status:  Discontinued     04/25/19 1252   04/22/19 2200  dextrose 5 % 250 mL with vancomycin (VANCOCIN) 1,250 mg infusion  Status:  Discontinued     04/23/19 1921   04/20/19 1400  vancomycin (VANCOCIN) 1,250 mg in sodium chloride 0.9 % 250 mL IVPB  Status:   Discontinued     04/20/19 1215   04/20/19 1400  vancomycin (VANCOCIN) 1,250 mg in sodium chloride 0.9 % 250 mL IVPB  Status:  Discontinued     04/20/19 1220   04/20/19 1400  Vancomycin HCl in Dextrose 1.25-5 GM/250ML-% SOLN 1,250 mg  Status:  Discontinued     04/20/19 1221   04/20/19 1400  Vancomycin 1250mg  in D5W  Status:  Discontinued     04/20/19 1233   04/20/19 1400  Vancomycin 1250mg  in D5W  Status:  Discontinued     04/22/19 1324   04/19/19 2200  piperacillin-tazobactam (ZOSYN) IVPB 3.375 g  Status:  Discontinued     04/21/19 1533   04/19/19 1430  vancomycin (VANCOCIN) 2,000 mg in sodium chloride 0.9 % 500 mL IVPB     04/19/19 1806   04/19/19 1400  piperacillin-tazobactam (ZOSYN) IVPB 3.375 g     04/19/19 1530   04/19/19 1000  azithromycin (ZITHROMAX) tablet 250 mg  Status:  Discontinued     04/20/19 1655   04/18/19 1300  azithromycin (ZITHROMAX) tablet 500 mg     04/18/19 1351  04/13/19 1000  remdesivir 100 mg in sodium chloride 0.9 % 250 mL IVPB     04/16/19 1103   04/12/19 1000  remdesivir 200 mg in sodium chloride 0.9 % 250 mL IVPB     04/12/19 1317   04/12/19 0600  ceFEPIme (MAXIPIME) 2 g in sodium chloride 0.9 % 100 mL IVPB  Status:  Discontinued     04/15/19 1452   04/12/19 0200  vancomycin (VANCOCIN) IVPB 750 mg/150 ml premix  Status:  Discontinued     04/12/19 1455   04/11/19 2130  ceFEPIme (MAXIPIME) 2 g in sodium chloride 0.9 % 100 mL IVPB     04/11/19 2257   04/11/19 2130  metroNIDAZOLE (FLAGYL) IVPB 500 mg     04/11/19 2356   04/11/19 2130  vancomycin (VANCOCIN) IVPB 1000 mg/200 mL premix     04/12/19 0105       Devices    LINES / TUBES:      Continuous Infusions: . sodium chloride Stopped (05/05/19 1136)  . ampicillin-sulbactam (UNASYN) IV 3 g (05/15/19 1238)     Objective: Vitals:   05/15/19 0700 05/15/19 0758 05/15/19 0900 05/15/19 1107  BP: 138/78  107/63   Pulse: 82   95  Resp: 18   (!) 24  Temp:   98.2 F (36.8 C)   TempSrc:    Oral   SpO2: 94% 92%  93%  Weight:      Height:        Intake/Output Summary (Last 24 hours) at 05/15/2019 1326 Last data filed at 05/15/2019 0700 Gross per 24 hour  Intake 1303.55 ml  Output 3968 ml  Net -2664.45 ml   Filed Weights   05/05/19 0300 05/06/19 0500 05/07/19 0235  Weight: 104.2 kg 111 kg 106.1 kg    Examination:  General: A/O x4, positive acute respiratory distress Eyes: negative scleral hemorrhage, negative anisocoria, negative icterus ENT: Negative Runny nose, negative gingival bleeding, Neck:  Negative scars, masses, torticollis, lymphadenopathy, JVD Lungs: Clear to auscultation bilaterally without wheezes or crackles Cardiovascular: Regular rate and rhythm without murmur gallop or rub normal S1 and S2 Abdomen: negative abdominal pain, nondistended, positive soft, bowel sounds, no rebound, no ascites, no appreciable mass Extremities: No significant cyanosis, clubbing, or edema bilateral lower extremities Skin: Negative rashes, lesions, ulcers Psychiatric:  Negative depression, negative anxiety, negative fatigue, negative mania  Central nervous system:  Cranial nerves II through XII intact, tongue/uvula midline, all extremities muscle strength 5/5, sensation intact throughout, negative dysarthria, negative expressive aphasia, negative receptive aphasia.  .     Data Reviewed: Care during the described time interval was provided by me .  I have reviewed this patient's available data, including medical history, events of note, physical examination, and all test results as part of my evaluation.   CBC: Recent Labs  Lab 05/13/19 0206 05/13/19 2220 05/13/19 2305 05/14/19 0801 05/14/19 2340 05/15/19 0610  WBC 5.1 10.2  --  6.7 7.1 6.5  NEUTROABS  --  7.4  --   --   --   --   HGB 9.2* 10.7* 9.9* 8.9* 8.9* 8.5*  HCT 28.5* 34.1* 29.0* 27.7* 27.8* 26.5*  MCV 94.7 95.8  --  94.9 94.2 93.6  PLT 112* 175  --  127* 132* [id number]*   Basic Metabolic Panel: Recent Labs   Lab 05/11/19 0123 05/13/19 0206 05/13/19 2220 05/13/19 2305 05/14/19 0801 05/15/19 0610  NA 138 137 138 136 137 139  K 3.8 3.9 4.6 4.4 4.3 4.1  CL  99 98 99  --  97* 100  CO2 31 32 29  --  31 30  GLUCOSE 173* 124* 356*  --  154* 196*  BUN 34* 25* 24*  --  26* 24*  CREATININE 1.29* 1.32* 1.36*  --  1.30* 1.31*  CALCIUM 8.8* 8.9 9.3  --  9.3 8.9  MG  --   --  2.0  --   --   --    GFR: Estimated Creatinine Clearance: 69.8 mL/min (A) (by C-G formula based on SCr of 1.31 mg/dL (H)). Liver Function Tests: Recent Labs  Lab 05/10/19 0650 05/11/19 0123 05/13/19 2220 05/14/19 0801 05/15/19 0610  AST 28 33 36 31 26  ALT 52* 58* 73* 64* 54*  ALKPHOS 112 130* 187* 156* 140*  BILITOT 0.4 0.4 0.4 0.1* 0.4  PROT 6.0* 6.3* 7.3 6.6 6.4*  ALBUMIN 2.2* 2.3* 2.6* 2.4* 2.2*   No results for input(s): LIPASE, AMYLASE in the last 168 hours. No results for input(s): AMMONIA in the last 168 hours. Coagulation Profile: No results for input(s): INR, PROTIME in the last 168 hours. Cardiac Enzymes: No results for input(s): CKTOTAL, CKMB, CKMBINDEX, TROPONINI in the last 168 hours. BNP (last 3 results) No results for input(s): PROBNP in the last 8760 hours. HbA1C: No results for input(s): HGBA1C in the last 72 hours. CBG: Recent Labs  Lab 05/14/19 2045 05/15/19 0006 05/15/19 0340 05/15/19 0936 05/15/19 1223  GLUCAP 221* 218* 202* 229* 240*   Lipid Profile: No results for input(s): CHOL, HDL, LDLCALC, TRIG, CHOLHDL, LDLDIRECT in the last 72 hours. Thyroid Function Tests: No results for input(s): TSH, T4TOTAL, FREET4, T3FREE, THYROIDAB in the last 72 hours. Anemia Panel: No results for input(s): VITAMINB12, FOLATE, FERRITIN, TIBC, IRON, RETICCTPCT in the last 72 hours. Urine analysis:    Component Value Date/Time   COLORURINE YELLOW 04/25/2019 0826   APPEARANCEUR HAZY (A) 04/25/2019 0826   LABSPEC 1.015 04/25/2019 0826   PHURINE 5.0 04/25/2019 0826   GLUCOSEU 150 (A) 04/25/2019 0826    HGBUR LARGE (A) 04/25/2019 0826   BILIRUBINUR NEGATIVE 04/25/2019 0826   KETONESUR NEGATIVE 04/25/2019 0826   PROTEINUR 30 (A) 04/25/2019 0826   UROBILINOGEN 0.2 12/22/2011 2130   NITRITE NEGATIVE 04/25/2019 0826   LEUKOCYTESUR NEGATIVE 04/25/2019 0826   Sepsis Labs: @LABRCNTIP (procalcitonin:4,lacticidven:4)  ) Recent Results (from the past 240 hour(s))  Novel Coronavirus, NAA (hospital order; send-out to ref lab)     Status: None   Collection Time: 05/06/19 10:16 AM   Specimen: Nasopharyngeal Swab; Respiratory  Result Value Ref Range Status   SARS-CoV-2, NAA NOT DETECTED NOT DETECTED Final    Comment: (NOTE) This test was developed and its performance characteristics determined by [employer]. This test has not been FDA cleared or approved. This test has been authorized by FDA under an Emergency Use Authorization (EUA). This test is only authorized for the duration of time the declaration that circumstances exist justifying the authorization of the emergency use of in vitro diagnostic tests for detection of SARS-CoV-2 virus and/or diagnosis of COVID-19 infection under section 564(b)(1) of the Act, 21 U.S.C. [id number]), unless the authorization is terminated or revoked sooner. When diagnostic testing is negative, the possibility of a false negative result should be considered in the context of a patient's recent exposures and the presence of clinical signs and symptoms consistent with COVID-19. An individual without symptoms of COVID-19 and who is not shedding SARS-CoV-2 virus would expect to have a negative (not detected) result in this assay.  Performed  At: [facility] [place], [place] [id number] Rush Farmer MD [id number]    [place]  Final    Comment: Performed at [facility] [address] [place], [place] [address]  SARS Coronavirus 2 [facility] order, Performed in [facility]  hospital lab)     Status: Abnormal   Collection Time: 05/07/19  5:23 PM  Result Value Ref Range Status   SARS Coronavirus 2 POSITIVE (A) NEGATIVE Final    Comment: CRITICAL RESULT CALLED TO, READ BACK BY AND VERIFIED WITH: RN T SCEGEN AT 2143 05/07/19 CRUICKSHANK A (NOTE) If result is NEGATIVE SARS-CoV-2 target nucleic acids are NOT DETECTED. The SARS-CoV-2 RNA is generally detectable in upper and lower  respiratory specimens during the acute phase of infection. The lowest  concentration of SARS-CoV-2 viral copies this assay can detect is 250  copies / mL. A negative result does not preclude SARS-CoV-2 infection  and should not be used as the sole basis for treatment or other  patient management decisions.  A negative result may occur with  improper specimen collection / handling, submission of specimen other  than nasopharyngeal swab, presence of viral mutation(s) within the  areas targeted by this assay, and inadequate number of viral copies  (<250 copies / mL). A negative result must be combined with clinical  observations, patient history, and epidemiological information. If result is POSITIVE SARS-CoV-2 target nucleic acids a re DETECTED. The SARS-CoV-2 RNA is generally detectable in upper and lower  respiratory specimens during the acute phase of infection.  Positive  results are indicative of active infection with SARS-CoV-2.  Clinical  correlation with patient history and other diagnostic information is  necessary to determine patient infection status.  Positive results do  not rule out bacterial infection or co-infection with other viruses. If result is PRESUMPTIVE POSTIVE SARS-CoV-2 nucleic acids MAY BE PRESENT.   A presumptive positive result was obtained on the submitted specimen  and confirmed on repeat testing.  While 2019 novel coronavirus  (SARS-CoV-2) nucleic acids may be present in the submitted sample  additional confirmatory testing may be necessary for  epidemiological  and / or clinical management purposes  to differentiate between  SARS-CoV-2 and other Sarbecovirus currently known to infect humans.  If clinically indicated additional testing with an alternate test  methodolo gy [phone]) is advised. The SARS-CoV-2 RNA is generally  detectable in upper and lower respiratory specimens during the acute  phase of infection. The expected result is Negative. Fact Sheet for Patients:  StrictlyIdeas.no Fact Sheet for Healthcare Providers: BankingDealers.co.za This test is not yet approved or cleared by the [place] FDA and has been authorized for detection and/or diagnosis of SARS-CoV-2 by FDA under an Emergency Use Authorization (EUA).  This EUA will remain in effect (meaning this test can be used) for the duration of the COVID-19 declaration under Section 564(b)(1) of the Act, 21 U.S.C. section 360bbb-3(b)(1), unless the authorization is terminated or revoked sooner. Performed at [facility], [place] [address] [place], [place] [address]   Novel Coronavirus, NAA (hospital order; send-out to ref lab)     Status: None   Collection Time: 05/11/19  1:58 PM   Specimen: Nasopharyngeal Swab; Respiratory  Result Value Ref Range Status   SARS-CoV-2, NAA NOT DETECTED NOT DETECTED Final    Comment: (NOTE) This nucleic acid amplification test was developed and its perfomance characteristics determined by [employer]. Nucleic acid amplification tests include PCR and TMA. This  test has not been FDA cleared or approved. This test has been authorized by FDA under an Emergency Use Authorization (EUA). This test is only authorized for the duration of time the declaration that circumstances exist justifying the authorization of the emergency use of in vitro diagnostic tests for detection of SARS-CoV-2 virus and/or diagnosis of COVID-19 infection under section 564(b)(1) of the  Act, 21 U.S.C. [id number]) (1), unless the authorization is terminated or revoked sooner. When diagnostic testing is negative, the possibility of a false negative result should be considered in the context of a patient's recent exposures and the presence of clinical signs and symptoms consistent with COVID-19. An individual without symptoms of COVID- 19 and who is not shedding SARS-CoV-2 vir [place] would expect to have a negative (not detected) result in this assay. Performed At: [facility] [place], [place] [id number] Rush Farmer MD [id number]    [place]  Final    Comment: Performed at [facility] [address] [place], [place] [address]  MRSA PCR Screening     Status: Abnormal   Collection Time: 05/13/19 11:29 PM   Specimen: Nasopharyngeal  Result Value Ref Range Status   MRSA by PCR POSITIVE (A) NEGATIVE Final    Comment:        The GeneXpert MRSA Assay (FDA approved for NASAL specimens only), is one component of a comprehensive MRSA colonization surveillance program. It is not intended to diagnose MRSA infection nor to guide or monitor treatment for MRSA infections. CRITICAL RESULT CALLED TO, READ BACK BY AND VERIFIED WITH: MOSLEY, L. RN @0800  ON 9.2.2020 BY [facility] Performed at [facility], [place] [address] [place], [place] [address]          Radiology Studies: Dg Abd 1 View  Result Date: 05/13/2019 CLINICAL DATA:  Vomiting EXAM: ABDOMEN - 1 VIEW COMPARISON:  04/30/2019 FINDINGS: Nonobstructive pattern of bowel gas with scattered gas present to the rectum. Scattered stool in the colon and high density ingested material in the cecal base. No free air in the abdomen on supine radiographs. A previously seen enteric feeding tube and esophagogastric tube have been removed. IMPRESSION: Nonobstructive pattern of bowel gas with scattered gas present to the rectum. Scattered stool in  the colon and high density ingested material in the cecal base. Electronically Signed   By: Eddie Candle M.D.   On: 05/13/2019 16:15   Dg Chest Port 1 View  Result Date: 05/13/2019 CLINICAL DATA:  Dyspnea. COVID-19 positive. EXAM: PORTABLE CHEST 1 VIEW COMPARISON:  04/25/2019 FINDINGS: Unchanged heart size with mild cardiomegaly. Unchanged mediastinal contours. Aortic atherosclerosis and coronary stent visualized. Opacification of the lower right hemithorax may represent combination of pleural fluid and airspace disease. Right costophrenic angle not included in the field of view. There is a left pleural effusion, increased from prior. Pulmonary and interstitial opacities edema has progressed. IMPRESSION: 1. Opacification of the lower right hemithorax may represent combination of pleural fluid and airspace disease, pneumonia versus aspiration. 2. Increased left pleural effusion since prior exam. Progressive interstitial opacities from prior likely combination of pulmonary edema and atypical pneumonia. Electronically Signed   By: Keith Rake M.D.   On: 05/13/2019 23:22        Scheduled Meds: . albuterol  2 puff Inhalation Q6H  . aspirin  81 mg Oral Daily  . atorvastatin  80 mg Oral q1800  . bethanechol  10 mg Oral BID  . bisacodyl  10 mg Rectal Once  .  bisacodyl  10 mg Rectal Once  . Chlorhexidine Gluconate Cloth  6 each Topical Daily  . clopidogrel  75 mg Oral Daily  . collagenase   Topical Daily  . feeding supplement (ENSURE ENLIVE)  237 mL Oral TID BM  . folic acid  1 mg Intravenous Daily  . furosemide  40 mg Intravenous BID  . furosemide  80 mg Intravenous Once  . gabapentin  100 mg Oral Q8H  . insulin aspart  0-20 Units Subcutaneous Q4H  . [START ON 05/16/2019] insulin glargine  22 Units Subcutaneous Daily  . LORazepam  1 mg Intravenous Once  . mouth rinse  15 mL Mouth Rinse BID  . metoprolol tartrate  50 mg Oral BID  . mometasone-formoterol  2 puff Inhalation BID  . multivitamin  with minerals  1 tablet Oral Daily  . polyethylene glycol  34 g Oral Once  . senna-docusate  3 tablet Oral BID  . sodium chloride flush  10-40 mL Intracatheter Q12H  . tamsulosin  0.8 mg Oral QPC supper   Continuous Infusions: . sodium chloride Stopped (05/05/19 1136)  . ampicillin-sulbactam (UNASYN) IV 3 g (05/15/19 1238)     LOS: 33 days   The patient is critically ill with multiple organ systems failure and requires high complexity decision making for assessment and support, frequent evaluation and titration of therapies, application of advanced monitoring technologies and extensive interpretation of multiple databases. Critical Care Time devoted to patient care services described in this note  Time spent: 40 minutes     Naama Sappington, Geraldo Docker, MD [facility] Pager [phone]  If 7PM-7AM, please contact night-coverage [email or web address] Password [email or web address] 05/15/2019, 1:26 PM

## 2019-05-15 NOTE — Progress Notes (Signed)
[id number]  0230 Bladder Scan > 999 mL,  Performed in and out cath, no resistance met, 750 mL bloody urine, several clots including clot on end of catheter when withdrawn, PVR = 518 mL. Explained to pt importance of repeating cath to fully empty bladder. Pt hesitant but agrees. Second I&O cath done with sterile technique, again, no resistance met, 600 mL bloody urine with a few clots. Unable to perform PVR as ladder Scanner battery died and CN unable to locate fresh battery.

## 2019-05-15 NOTE — Progress Notes (Signed)
Inpatient Rehab Admissions Coordinator:    Note decline in status requiring return to ICU.  Will continue to follow for timing of rehab assessment.    Shann Medal, PT, DPT Admissions Coordinator [phone] 05/15/19  12:08 PM

## 2019-05-15 NOTE — Progress Notes (Signed)
Pt video/facetimeing with daughter, update provided to daughter by RN on pt condition and plan.

## 2019-05-15 NOTE — Progress Notes (Signed)
   NAME:  Robert [name], MRN:  [id number], DOB:  07-Nov-1950, LOS: 43 ADMISSION DATE:  04/11/2019, CONSULTATION DATE:  8/1 REFERRING MD:  Roderic Palau, CHIEF COMPLAINT:  dyspnea    Seen briefly on rounds Oxygen weaned back down to [facility] No chest pain Troponin down trending Has had SLP evaluation Bladder outlet obstruction  rec transfer back to PCU or to [facility] if appropriate when sars cov 2 swab negative again for further cardiac evaluation  PCCM will sign off  Roselie Awkward, MD [place] [facility] Pager: [phone] Cell: [phone] If no response, call [phone]

## 2019-05-16 DIAGNOSIS — I214 Non-ST elevation (NSTEMI) myocardial infarction: Secondary | ICD-10-CM

## 2019-05-16 LAB — GLUCOSE, CAPILLARY
Glucose-Capillary: 124 mg/dL — ABNORMAL HIGH (ref 70–99)
Glucose-Capillary: 133 mg/dL — ABNORMAL HIGH (ref 70–99)
Glucose-Capillary: 208 mg/dL — ABNORMAL HIGH (ref 70–99)
Glucose-Capillary: 254 mg/dL — ABNORMAL HIGH (ref 70–99)
Glucose-Capillary: 303 mg/dL — ABNORMAL HIGH (ref 70–99)
Glucose-Capillary: 384 mg/dL — ABNORMAL HIGH (ref 70–99)

## 2019-05-16 LAB — COMPREHENSIVE METABOLIC PANEL
ALT: 58 U/L — ABNORMAL HIGH (ref 0–44)
AST: 31 U/L (ref 15–41)
Albumin: 2 g/dL — ABNORMAL LOW (ref 3.5–5.0)
Alkaline Phosphatase: 132 U/L — ABNORMAL HIGH (ref 38–126)
Anion gap: 10 (ref 5–15)
BUN: 23 mg/dL (ref 8–23)
CO2: 30 mmol/L (ref 22–32)
Calcium: 8.6 mg/dL — ABNORMAL LOW (ref 8.9–10.3)
Chloride: 97 mmol/L — ABNORMAL LOW (ref 98–111)
Creatinine, Ser: 1.34 mg/dL — ABNORMAL HIGH (ref 0.61–1.24)
GFR calc Af Amer: >60 mL/min (ref >60)
GFR calc non Af Amer: 55 mL/min — ABNORMAL LOW (ref >60)
Glucose, Bld: 191 mg/dL — ABNORMAL HIGH (ref 70–99)
Potassium: 3.5 mmol/L (ref 3.5–5.1)
Sodium: 137 mmol/L (ref 135–145)
Total Bilirubin: 0.2 mg/dL — ABNORMAL LOW (ref 0.3–1.2)
Total Protein: 6.3 g/dL — ABNORMAL LOW (ref 6.5–8.1)

## 2019-05-16 LAB — MAGNESIUM: Magnesium: 1.7 mg/dL (ref 1.7–2.4)

## 2019-05-16 LAB — CBC
HCT: 25.1 % — ABNORMAL LOW (ref 39.0–52.0)
Hemoglobin: 8 g/dL — ABNORMAL LOW (ref 13.0–17.0)
MCH: 30.2 pg (ref 26.0–34.0)
MCHC: 31.9 g/dL (ref 30.0–36.0)
MCV: 94.7 fL (ref 80.0–100.0)
Platelets: 149 10*3/uL — ABNORMAL LOW (ref 150–400)
RBC: 2.65 MIL/uL — ABNORMAL LOW (ref 4.22–5.81)
RDW: 16.4 % — ABNORMAL HIGH (ref 11.5–15.5)
WBC: 6 10*3/uL (ref 4.0–10.5)
nRBC: 0 % (ref 0.0–0.2)

## 2019-05-16 LAB — PROCALCITONIN: Procalcitonin: 0.53 ng/mL

## 2019-05-16 MED ORDER — FOLIC ACID 1 MG PO TABS
1.0000 mg | ORAL_TABLET | Freq: Every day | ORAL | Status: DC
  Administered 2019-05-17 – 2019-05-22 (×5): 1 mg via ORAL
  Filled 2019-05-16 (×6): qty 1

## 2019-05-16 MED ORDER — POTASSIUM CHLORIDE CRYS ER 20 MEQ PO TBCR
50.0000 meq | EXTENDED_RELEASE_TABLET | Freq: Once | ORAL | Status: AC
  Administered 2019-05-16: 10:00:00 50 meq via ORAL
  Filled 2019-05-16: qty 3

## 2019-05-16 MED ORDER — FUROSEMIDE 10 MG/ML IJ SOLN
40.0000 mg | Freq: Every day | INTRAMUSCULAR | Status: DC
  Administered 2019-05-17: 10:00:00 40 mg via INTRAVENOUS
  Filled 2019-05-16: qty 4

## 2019-05-16 MED ORDER — MAGNESIUM OXIDE 400 (241.3 MG) MG PO TABS
400.0000 mg | ORAL_TABLET | Freq: Once | ORAL | Status: AC
  Administered 2019-05-16: 10:00:00 400 mg via ORAL
  Filled 2019-05-16: qty 1

## 2019-05-16 MED ORDER — METOPROLOL TARTRATE 50 MG PO TABS
50.0000 mg | ORAL_TABLET | Freq: Two times a day (BID) | ORAL | Status: DC
  Administered 2019-05-16 – 2019-05-22 (×12): 50 mg via ORAL
  Filled 2019-05-16: qty 2
  Filled 2019-05-16 (×2): qty 1
  Filled 2019-05-16 (×2): qty 2
  Filled 2019-05-16: qty 1
  Filled 2019-05-16: qty 2
  Filled 2019-05-16 (×6): qty 1

## 2019-05-16 MED ORDER — INSULIN ASPART 100 UNIT/ML ~~LOC~~ SOLN
0.0000 [IU] | Freq: Three times a day (TID) | SUBCUTANEOUS | Status: DC
  Administered 2019-05-16: 7 [IU] via SUBCUTANEOUS
  Administered 2019-05-17: 11 [IU] via SUBCUTANEOUS
  Administered 2019-05-17: 23:00:00 7 [IU] via SUBCUTANEOUS
  Administered 2019-05-17: 18:00:00 15 [IU] via SUBCUTANEOUS
  Administered 2019-05-17: 3 [IU] via SUBCUTANEOUS
  Administered 2019-05-18: 22:00:00 7 [IU] via SUBCUTANEOUS
  Administered 2019-05-18: 4 [IU] via SUBCUTANEOUS
  Administered 2019-05-18: 7 [IU] via SUBCUTANEOUS
  Administered 2019-05-18: 13:00:00 4 [IU] via SUBCUTANEOUS
  Administered 2019-05-19: 22:00:00 11 [IU] via SUBCUTANEOUS
  Administered 2019-05-19 (×2): 4 [IU] via SUBCUTANEOUS
  Administered 2019-05-21: 09:00:00 3 [IU] via SUBCUTANEOUS

## 2019-05-16 MED ORDER — GABAPENTIN 100 MG PO CAPS
100.0000 mg | ORAL_CAPSULE | Freq: Three times a day (TID) | ORAL | Status: DC
  Administered 2019-05-16 – 2019-05-22 (×18): 100 mg via ORAL
  Filled 2019-05-16 (×24): qty 1

## 2019-05-16 NOTE — Progress Notes (Addendum)
PROGRESS NOTE    Robert [name]  [id number] DOB: Jan 15, 1951 DOA: 04/11/2019 PCP: Redmond School, MD   Brief Narrative:  68 year old WM PMHx COPD, CAD, diabetes mellitus, ischemic cardiomyopathy, RA,   Presented to the hospital and was admitted on 04/11/2019 with shortness of breath, he was found to be significantly hypoxic, tested positive for COVID-19, was intubated and transferred to [facility] for further management.  Hospital course complicated by persistent encephalopathy and difficulty weaning due to tachypnea and agitation, he failed extubation once on 8/2.  Hospital course was also complicated by MRSA pneumonia with septic shock at the beginning of August status post completed treatment. Patient eventually was eventually successfully extubated on 8/22 and transferred to the floor on 8/24   Subjective: 9/4 A/O x4, negative CP, positive S OB, negative abdominal pain.  Becoming anxious about discharge plan. Afebrile last 24 hours     Assessment & Plan:   Principal Problem:   Pneumonia due to COVID-19 virus Active Problems:   Rheumatoid arthritis ([place])   Hyperlipidemia   COPD (chronic obstructive pulmonary disease) (HCC)   Controlled type 2 diabetes mellitus with diabetic peripheral angiopathy without gangrene, with long-term current use of insulin (HCC)   Essential hypertension   Chronic combined systolic and diastolic CHF (congestive heart failure) (HCC)   Ischemic cardiomyopathy   Coronary artery disease   Dyslipidemia (high LDL; low HDL)   Acute respiratory failure with hypoxemia (HCC)   COVID-19 virus detected   Acute respiratory distress syndrome (ARDS) due to COVID-19 virus   Atrial fibrillation with RVR (HCC)   COPD (chronic obstructive pulmonary disease) with emphysema (HCC)   AKI (acute kidney injury) ([place])   Diabetes mellitus type 2, uncontrolled, with complications (HCC)   Pressure injury of skin   Staphylococcal pneumonia ([place])   HCAP (healthcare-associated  pneumonia)   Aspiration pneumonia of both lower lobes due to gastric secretions (HCC)   MRSA pneumonia (HCC)   Severe sepsis with septic shock (HCC)   NSTEMI (non-ST elevated myocardial infarction) ([place])   Acute Hypoxic Respiratory Failure with hypoxia/ Covid-19 pneumonia/ COPD exacerbation -Patient continues to smoke 1.5 PPD until admission to the hospital - Titrate O2 to maintain SPO2 89 to 93% - Currently on 8 L/min O2, SPO2 92% - Completed course of Remdesivir - Completed course of Decadron/Solu-Medrol - Completed Actemra 8/1 - Flutter valve - Albuterol QID - PT/OT consult pending -Ambulate patient every shift -Ambulatory SPO2 pending  Aspiration pneumonia -9/1 PCXR shows continued aspiration pneumonia see below -Complete 5-day course of Unasyn -Speech evaluated patient dysphagia 1 diet with honey thick liquid.  MRSA /septic shock -Off pressors - Completed course antibiotic  NSTEMI/CAD - Hx CAD -Known chronic total occlusion of RCA, 60% stenosis proximal LAD and 60% stenosis ostial first diagonal artery. 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 -Patient's troponin troponin trending up significantly 12>221>1236, -Medical management per cardiology -Strict in and out -17.4 L -Daily weight Filed Weights   05/06/19 0500 05/07/19 0235  Weight: 111 kg 106.1 kg  - 9/4 decrease Lasix 40 mg daily (creatinine trending up) - Hydralazine PRN - Metoprolol 50 mg BID  Acute on chronic systolic and diastolic CHF - See NSTEMI  Paroxysmal atrial fib with RVR -Currently NSR - See NSTEMI  Diabetes type 2 uncontrolled with complication (neuropathy, vascular disease)  - 7/31 hemoglobin A1c= 7.4 -9/3 increase Lantus 22 units -9/3 increase resistant SSI  Deconditioning/ICU induced muscle wasting -9/3 PT OT consult pending  Hypernatremia -Resolved  Hypokalemia - Potassium goal> 4 -  Potassium p.o. 50 mEq  Hypomagnesmia - Magnesium goal>2 - Magnesium oxide  400 mg  Acute kidney injury (baseline Cr 0.80) Lab Results  Component Value Date   CREATININE 1.34 (H) 05/16/2019   CREATININE 1.31 (H) 05/15/2019   CREATININE 1.30 (H) 05/14/2019  - Not yet back to baseline  Acute  urinary retention - Patient has had multiple voiding trial failures.  Last one was 9/2 overnight. - Patient with in and out cath x2 overnight bloody with blood clots. - Heparin has been DC'd, reinsert Foley if urine does not clear will require urology consult - Continue Flomax 0.8 mg daily  ETT clotting/bleeding from subclavian line -ET tube clotted exchanged on 8/12 -Resolved s/p discontinuation of heparin on 9/3  Thrombocytopenia - Improving  RA -He is on Actemra and methotrexate at home, currently on hold  Pressure ulcer of medial sacrum bilateral buttocks -Wound care service consulted, will follow recommendations Pressure Injury 04/17/19 Sacrum Medial Deep Tissue Injury - Purple or maroon localized area of discolored intact skin or blood-filled blister due to damage of underlying soft tissue from pressure and/or shear. dark purple with some skin sloughing on lowe (Active)  04/17/19 0730  Location: Sacrum  Location Orientation: Medial  Staging: Deep Tissue Injury - Purple or maroon localized area of discolored intact skin or blood-filled blister due to damage of underlying soft tissue from pressure and/or shear.  Wound Description (Comments): dark purple with some skin sloughing on lower sacral area  Present on Admission: No     Pressure Injury 04/17/19 Buttocks Right;Lower Unstageable - Full thickness tissue loss in which the base of the ulcer is covered by slough (yellow, tan, gray, green or brown) and/or eschar (tan, brown or black) in the wound bed. (Active)  04/17/19 0730  Location: Buttocks  Location Orientation: Right;Lower  Staging: Unstageable - Full thickness tissue loss in which the base of the ulcer is covered by slough (yellow, tan, gray, green or  brown) and/or eschar (tan, brown or black) in the wound bed.  Wound Description (Comments):   Present on Admission: No     Pressure Injury 04/17/19 Buttocks Left;Lower WOC updated to Stage 3 on 05/06/19 at the time of assessment (Active)  04/17/19 0730  Location: Buttocks  Location Orientation: Left;Lower  Staging:   Wound Description (Comments): WOC updated to Stage 3 on 05/06/19 at the time of assessment  Present on Admission: No    Goals of care - 9/4 PT/OT consult.  Patient with ischemic cardiomyopathy, s/p COVID pneumonia evaluate for SNF vs CIR.  Work with patient daily.      DVT prophylaxis: SCD Code Status: Full Family Communication: 9/4 spoke with Dortha Kern (daughter) counseled her on plan of care.  Answered all questions Disposition Plan: TBD   Consultants:  PCCM     Procedures/Significant Events:  8/1 admission to [facility] ICU 8/2 extubated 8/3 early AM agitated, placed on BIPAP, required re-intubation 8/4 started on steroids for COPD exacerbation 8/8 septic shock requiring pressor support 8/9 off pressors, respiratory cultures +s.aureus 8/10 persistent encephalopathy off sedation, CT head ordered 8/11 encephalopathy improved 8/12 difficulty weaning due to tachypnea, ETT exchanged due to clot burden 8/13 PICC line inserted and subclavian central line removed 8/14 Antibiotics coverage broaden due to persistent fever and resp distress, pan cultured  8/19 more alert, tolerating pressure support 8/20 failed voiding trial, Foley needed to be reinserted 8/22 successfully extubated  8/24 transfer to the floor 9/1  transferred to ICU given worsening respiratory failure, increased work  of breathing 9/1 PCXR:-Opacification of the lower right hemithorax may represent combination of pleural fluid and airspace disease, pneumonia vs aspiration. -Increased left pleural effusion since prior exam.  -Progressive interstitial opacities from prior likely combination of  pulmonary edema and atypical pneumonia. 9/2 echocardiogram: LVEF = 50-55%.-mildly dilated. moderate basal septal hypertrophy.  -Left ventricular diastolic Doppler parameters are consistent with restrictive filling.  Mitral valve regurgitation: mild -moderate .   I have personally reviewed and interpreted all radiology studies and my findings are as above.  VENTILATOR SETTINGS: Nasal cannula 2 L/min SPO2 93%   Cultures 7/31 blood negative final 7/31 RIGHT arm negative 7/31 urine negative 8/1 tracheal aspirate respiratory flora 8/7 blood right AC negative 8/7 blood RIGHT hand negative 8/7 tracheal aspirate positive MRSA 8/8 urine negative 8/14 blood LEFT hand negative 8/14 blood RIGHT hand negative 8/15 tracheal aspirate positive MRSA 9/1 MRSA by PCR positive      Antimicrobials: Anti-infectives (From admission, onward)   Start     Stop   05/14/19 0430  Ampicillin-Sulbactam (UNASYN) 3 g in sodium chloride 0.9 % 100 mL IVPB     05/18/19 2359   04/30/19 2200  vancomycin (VANCOCIN) 1,500 mg in sodium chloride 0.9 % 500 mL IVPB     05/03/19 1900   04/29/19 0900  vancomycin (VANCOCIN) 1,500 mg in sodium chloride 0.9 % 500 mL IVPB  Status:  Discontinued     04/29/19 2324   04/27/19 1800  ceFEPIme (MAXIPIME) 2 g in sodium chloride 0.9 % 100 mL IVPB  Status:  Discontinued     04/29/19 1316   04/26/19 1630  vancomycin (VANCOCIN) 1,750 mg in sodium chloride 0.9 % 500 mL IVPB  Status:  Discontinued     04/29/19 0847   04/25/19 1400  vancomycin (VANCOCIN) IVPB 1000 mg/200 mL premix     04/25/19 1456   04/25/19 1300  ceFEPIme (MAXIPIME) 2 g in sodium chloride 0.9 % 100 mL IVPB  Status:  Discontinued     04/27/19 1319   04/23/19 1921  vancomycin variable dose per unstable renal function (pharmacist dosing)  Status:  Discontinued     04/25/19 1252   04/22/19 2200  dextrose 5 % 250 mL with vancomycin (VANCOCIN) 1,250 mg infusion  Status:  Discontinued     04/23/19 1921   04/20/19  1400  vancomycin (VANCOCIN) 1,250 mg in sodium chloride 0.9 % 250 mL IVPB  Status:  Discontinued     04/20/19 1215   04/20/19 1400  vancomycin (VANCOCIN) 1,250 mg in sodium chloride 0.9 % 250 mL IVPB  Status:  Discontinued     04/20/19 1220   04/20/19 1400  Vancomycin HCl in Dextrose 1.25-5 GM/250ML-% SOLN 1,250 mg  Status:  Discontinued     04/20/19 1221   04/20/19 1400  Vancomycin 1250mg  in D5W  Status:  Discontinued     04/20/19 1233   04/20/19 1400  Vancomycin 1250mg  in D5W  Status:  Discontinued     04/22/19 1324   04/19/19 2200  piperacillin-tazobactam (ZOSYN) IVPB 3.375 g  Status:  Discontinued     04/21/19 1533   04/19/19 1430  vancomycin (VANCOCIN) 2,000 mg in sodium chloride 0.9 % 500 mL IVPB     04/19/19 1806   04/19/19 1400  piperacillin-tazobactam (ZOSYN) IVPB 3.375 g     04/19/19 1530   04/19/19 1000  azithromycin (ZITHROMAX) tablet 250 mg  Status:  Discontinued     04/20/19 1655   04/18/19 1300  azithromycin (ZITHROMAX) tablet 500 mg  04/18/19 1351   04/13/19 1000  remdesivir 100 mg in sodium chloride 0.9 % 250 mL IVPB     04/16/19 1103   04/12/19 1000  remdesivir 200 mg in sodium chloride 0.9 % 250 mL IVPB     04/12/19 1317   04/12/19 0600  ceFEPIme (MAXIPIME) 2 g in sodium chloride 0.9 % 100 mL IVPB  Status:  Discontinued     04/15/19 1452   04/12/19 0200  vancomycin (VANCOCIN) IVPB 750 mg/150 ml premix  Status:  Discontinued     04/12/19 1455   04/11/19 2130  ceFEPIme (MAXIPIME) 2 g in sodium chloride 0.9 % 100 mL IVPB     04/11/19 2257   04/11/19 2130  metroNIDAZOLE (FLAGYL) IVPB 500 mg     04/11/19 2356   04/11/19 2130  vancomycin (VANCOCIN) IVPB 1000 mg/200 mL premix     04/12/19 0105       Devices    LINES / TUBES:      Continuous Infusions:  sodium chloride Stopped (05/05/19 1136)   ampicillin-sulbactam (UNASYN) IV 3 g (05/16/19 0541)     Objective: Vitals:   05/15/19 0700 05/15/19 0758 05/15/19 0900 05/15/19 1107  BP: 138/78  107/63    Pulse: 82   95  Resp: 18   (!) 24  Temp:   98.2 F (36.8 C)   TempSrc:   Oral   SpO2: 94% 92%  93%  Weight:      Height:        Intake/Output Summary (Last 24 hours) at 05/16/2019 0743 Last data filed at 05/16/2019 0500 Gross per 24 hour  Intake 781.86 ml  Output 2600 ml  Net -1818.14 ml   Filed Weights   05/06/19 0500 05/07/19 0235  Weight: 111 kg 106.1 kg   Physical Exam:  General: A/O x4, positive acute respiratory distress Eyes: negative scleral hemorrhage, negative anisocoria, negative icterus ENT: Negative Runny nose, negative gingival bleeding, Neck:  Negative scars, masses, torticollis, lymphadenopathy, JVD Lungs: Clear to auscultation bilaterally without wheezes or crackles Cardiovascular: Regular rate and rhythm without murmur gallop or rub normal S1 and S2 Abdomen: negative abdominal pain, nondistended, positive soft, bowel sounds, no rebound, no ascites, no appreciable mass Extremities: No significant cyanosis, clubbing, or edema bilateral lower extremities Skin: Negative rashes, lesions, ulcers Psychiatric:  Negative depression, negative anxiety, negative fatigue, negative mania  Central nervous system:  Cranial nerves II through XII intact, tongue/uvula midline, all extremities muscle strength 5/5, sensation intact throughout, negative dysarthria, negative expressive aphasia, negative receptive aphasia. .     Data Reviewed: Care during the described time interval was provided by me .  I have reviewed this patient's available data, including medical history, events of note, physical examination, and all test results as part of my evaluation.   CBC: Recent Labs  Lab 05/13/19 2220 05/13/19 2305 05/14/19 0801 05/14/19 2340 05/15/19 0610 05/16/19 0139  WBC 10.2  --  6.7 7.1 6.5 6.0  NEUTROABS 7.4  --   --   --   --   --   HGB 10.7* 9.9* 8.9* 8.9* 8.5* 8.0*  HCT 34.1* 29.0* 27.7* 27.8* 26.5* 25.1*  MCV 95.8  --  94.9 94.2 93.6 94.7  PLT 175  --  127* 132*  147* [id number]*   Basic Metabolic Panel: Recent Labs  Lab 05/13/19 0206 05/13/19 2220 05/13/19 2305 05/14/19 0801 05/15/19 0610 05/16/19 0139  NA 137 138 136 137 139 137  K 3.9 4.6 4.4 4.3 4.1 3.5  CL 98  99  --  97* 100 97*  CO2 32 29  --  31 30 30   GLUCOSE 124* 356*  --  154* 196* 191*  BUN 25* 24*  --  26* 24* 23  CREATININE 1.32* 1.36*  --  1.30* 1.31* 1.34*  CALCIUM 8.9 9.3  --  9.3 8.9 8.6*  MG  --  2.0  --   --   --  1.7   GFR: Estimated Creatinine Clearance: 68.3 mL/min (A) (by C-G formula based on SCr of 1.34 mg/dL (H)). Liver Function Tests: Recent Labs  Lab 05/11/19 0123 05/13/19 2220 05/14/19 0801 05/15/19 0610 05/16/19 0139  AST 33 36 31 26 31   ALT 58* 73* 64* 54* 58*  ALKPHOS 130* 187* 156* 140* 132*  BILITOT 0.4 0.4 0.1* 0.4 0.2*  PROT 6.3* 7.3 6.6 6.4* 6.3*  ALBUMIN 2.3* 2.6* 2.4* 2.2* 2.0*   No results for input(s): LIPASE, AMYLASE in the last 168 hours. No results for input(s): AMMONIA in the last 168 hours. Coagulation Profile: No results for input(s): INR, PROTIME in the last 168 hours. Cardiac Enzymes: No results for input(s): CKTOTAL, CKMB, CKMBINDEX, TROPONINI in the last 168 hours. BNP (last 3 results) No results for input(s): PROBNP in the last 8760 hours. HbA1C: No results for input(s): HGBA1C in the last 72 hours. CBG: Recent Labs  Lab 05/15/19 1223 05/15/19 1606 05/15/19 2059 05/15/19 2356 05/16/19 0436  GLUCAP 240* 253* 276* 254* 124*   Lipid Profile: No results for input(s): CHOL, HDL, LDLCALC, TRIG, CHOLHDL, LDLDIRECT in the last 72 hours. Thyroid Function Tests: No results for input(s): TSH, T4TOTAL, FREET4, T3FREE, THYROIDAB in the last 72 hours. Anemia Panel: No results for input(s): VITAMINB12, FOLATE, FERRITIN, TIBC, IRON, RETICCTPCT in the last 72 hours. Urine analysis:    Component Value Date/Time   COLORURINE YELLOW 04/25/2019 0826   APPEARANCEUR HAZY (A) 04/25/2019 0826   LABSPEC 1.015 04/25/2019 0826   PHURINE 5.0  04/25/2019 0826   GLUCOSEU 150 (A) 04/25/2019 0826   HGBUR LARGE (A) 04/25/2019 0826   BILIRUBINUR NEGATIVE 04/25/2019 0826   KETONESUR NEGATIVE 04/25/2019 0826   PROTEINUR 30 (A) 04/25/2019 0826   UROBILINOGEN 0.2 12/22/2011 2130   NITRITE NEGATIVE 04/25/2019 0826   LEUKOCYTESUR NEGATIVE 04/25/2019 0826   Sepsis Labs: @LABRCNTIP (procalcitonin:4,lacticidven:4)  ) Recent Results (from the past 240 hour(s))  Novel Coronavirus, NAA (hospital order; send-out to ref lab)     Status: None   Collection Time: 05/06/19 10:16 AM   Specimen: Nasopharyngeal Swab; Respiratory  Result Value Ref Range Status   SARS-CoV-2, NAA NOT DETECTED NOT DETECTED Final    Comment: (NOTE) This test was developed and its performance characteristics determined by [employer]. This test has not been FDA cleared or approved. This test has been authorized by FDA under an Emergency Use Authorization (EUA). This test is only authorized for the duration of time the declaration that circumstances exist justifying the authorization of the emergency use of in vitro diagnostic tests for detection of SARS-CoV-2 virus and/or diagnosis of COVID-19 infection under section 564(b)(1) of the Act, 21 U.S.C. [id number]), unless the authorization is terminated or revoked sooner. When diagnostic testing is negative, the possibility of a false negative result should be considered in the context of a patient's recent exposures and the presence of clinical signs and symptoms consistent with COVID-19. An individual without symptoms of COVID-19 and who is not shedding SARS-CoV-2 virus would expect to have a negative (not detected) result in this assay. Performed  At:  [facility] [place], [place] [id number] Rush Farmer MD [id number]    Coronavirus Source NASOPHARYNGEAL  Final    Comment: Performed at [place] [address] [place], [place] [address]  SARS  Coronavirus 2 [facility] order, Performed in [facility] hospital lab)     Status: Abnormal   Collection Time: 05/07/19  5:23 PM  Result Value Ref Range Status   SARS Coronavirus 2 POSITIVE (A) NEGATIVE Final    Comment: CRITICAL RESULT CALLED TO, READ BACK BY AND VERIFIED WITH: RN T SCEGEN AT 2143 05/07/19 CRUICKSHANK A (NOTE) If result is NEGATIVE SARS-CoV-2 target nucleic acids are NOT DETECTED. The SARS-CoV-2 RNA is generally detectable in upper and lower  respiratory specimens during the acute phase of infection. The lowest  concentration of SARS-CoV-2 viral copies this assay can detect is 250  copies / mL. A negative result does not preclude SARS-CoV-2 infection  and should not be used as the sole basis for treatment or other  patient management decisions.  A negative result may occur with  improper specimen collection / handling, submission of specimen other  than nasopharyngeal swab, presence of viral mutation(s) within the  areas targeted by this assay, and inadequate number of viral copies  (<250 copies / mL). A negative result must be combined with clinical  observations, patient history, and epidemiological information. If result is POSITIVE SARS-CoV-2 target nucleic acids a re DETECTED. The SARS-CoV-2 RNA is generally detectable in upper and lower  respiratory specimens during the acute phase of infection.  Positive  results are indicative of active infection with SARS-CoV-2.  Clinical  correlation with patient history and other diagnostic information is  necessary to determine patient infection status.  Positive results do  not rule out bacterial infection or co-infection with other viruses. If result is PRESUMPTIVE POSTIVE SARS-CoV-2 nucleic acids MAY BE PRESENT.   A presumptive positive result was obtained on the submitted specimen  and confirmed on repeat testing.  While 2019 novel coronavirus  (SARS-CoV-2) nucleic acids may be present in the submitted sample    additional confirmatory testing may be necessary for epidemiological  and / or clinical management purposes  to differentiate between  SARS-CoV-2 and other Sarbecovirus currently known to infect humans.  If clinically indicated additional testing with an alternate test  methodolo gy [phone]) is advised. The SARS-CoV-2 RNA is generally  detectable in upper and lower respiratory specimens during the acute  phase of infection. The expected result is Negative. Fact Sheet for Patients:  StrictlyIdeas.no Fact Sheet for Healthcare Providers: BankingDealers.co.za This test is not yet approved or cleared by the [place] FDA and has been authorized for detection and/or diagnosis of SARS-CoV-2 by FDA under an Emergency Use Authorization (EUA).  This EUA will remain in effect (meaning this test can be used) for the duration of the COVID-19 declaration under Section 564(b)(1) of the Act, 21 U.S.C. section 360bbb-3(b)(1), unless the authorization is terminated or revoked sooner. Performed at [facility], [place] [address] [place], [place] [address]   Novel Coronavirus, NAA (hospital order; send-out to ref lab)     Status: None   Collection Time: 05/11/19  1:58 PM   Specimen: Nasopharyngeal Swab; Respiratory  Result Value Ref Range Status   SARS-CoV-2, NAA NOT DETECTED NOT DETECTED Final    Comment: (NOTE) This nucleic acid amplification test was developed and its perfomance characteristics determined by [employer]. Nucleic acid amplification tests include PCR and TMA. This test has  not been FDA cleared or approved. This test has been authorized by FDA under an Emergency Use Authorization (EUA). This test is only authorized for the duration of time the declaration that circumstances exist justifying the authorization of the emergency use of in vitro diagnostic tests for detection of SARS-CoV-2 virus and/or  diagnosis of COVID-19 infection under section 564(b)(1) of the Act, 21 U.S.C. [id number]) (1), unless the authorization is terminated or revoked sooner. When diagnostic testing is negative, the possibility of a false negative result should be considered in the context of a patient's recent exposures and the presence of clinical signs and symptoms consistent with COVID-19. An individual without symptoms of COVID- 19 and who is not shedding SARS-CoV-2 vir [place] would expect to have a negative (not detected) result in this assay. Performed At: [facility] [place], [place] [id number] Rush Farmer MD [id number]    [place]  Final    Comment: Performed at [facility] [address] [place], [place] [address]  MRSA PCR Screening     Status: Abnormal   Collection Time: 05/13/19 11:29 PM   Specimen: Nasopharyngeal  Result Value Ref Range Status   MRSA by PCR POSITIVE (A) NEGATIVE Final    Comment:        The GeneXpert MRSA Assay (FDA approved for NASAL specimens only), is one component of a comprehensive MRSA colonization surveillance program. It is not intended to diagnose MRSA infection nor to guide or monitor treatment for MRSA infections. CRITICAL RESULT CALLED TO, READ BACK BY AND VERIFIED WITH: Shelly Bombard. RN @0800  ON 9.2.2020 BY [facility] Performed at [facility], [place] [address] [place], [place] [address]   Novel Coronavirus, NAA (hospital order; send-out to ref lab)     Status: None   Collection Time: 05/14/19 12:17 PM   Specimen: Nasopharyngeal Swab; Respiratory  Result Value Ref Range Status   SARS-CoV-2, NAA NOT DETECTED NOT DETECTED Final    Comment: (NOTE) This nucleic acid amplification test was developed and its performance characteristics determined by [employer]. Nucleic acid amplification tests include PCR and TMA. This test has not been FDA cleared or approved.  This test has been authorized by FDA under an Emergency Use Authorization (EUA). This test is only authorized for the duration of time the declaration that circumstances exist justifying the authorization of the emergency use of in vitro diagnostic tests for detection of SARS-CoV-2 virus and/or diagnosis of COVID-19 infection under section 564(b)(1) of the Act, 21 U.S.C. [id number]) (1), unless the authorization is terminated or revoked sooner. When diagnostic testing is negative, the possibility of a false negative result should be considered in the context of a patient's recent exposures and the presence of clinical signs and symptoms consistent with COVID-19. An individual without symptoms of COVID- 19 and who is not shedding SARS-CoV-2 vi rus would expect to have a negative (not detected) result in this assay. Performed At: [facility] [place], [place] [id number] Rush Farmer MD [id number]    [place]  Final    Comment: Performed at [place] [address] [place], [place] [address]         Radiology Studies: Dg Chest Port 1 View  Result Date: 05/15/2019 CLINICAL DATA:  Pneumonia EXAM: PORTABLE CHEST 1 VIEW COMPARISON:  05/13/2019 FINDINGS: Multifocal airspace opacities are again noted bilaterally. The heart size remains enlarged. Aortic calcifications are noted. There are likely small bilateral pleural effusions, right greater  than left. There is no pneumothorax. No evidence for an acute osseous abnormality. There are prominent interstitial lung markings bilaterally. IMPRESSION: 1. Stable multifocal airspace opacities bilaterally. 2. Likely small bilateral pleural effusions, right greater than left. 3. Cardiomegaly with findings of volume overload. Electronically Signed   By: Constance Holster M.D.   On: 05/15/2019 22:44        Scheduled Meds:  albuterol  2 puff Inhalation Q6H   aspirin  81 mg  Oral Daily   atorvastatin  80 mg Oral q1800   bethanechol  10 mg Oral BID   bisacodyl  10 mg Rectal Once   bisacodyl  10 mg Rectal Once   Chlorhexidine Gluconate Cloth  6 each Topical Daily   clopidogrel  75 mg Oral Daily   collagenase   Topical Daily   feeding supplement (ENSURE ENLIVE)  237 mL Oral TID BM   folic acid  1 mg Intravenous Daily   furosemide  40 mg Intravenous BID   furosemide  80 mg Intravenous Once   gabapentin  100 mg Oral Q8H   insulin aspart  0-20 Units Subcutaneous Q4H   insulin glargine  22 Units Subcutaneous Daily   LORazepam  1 mg Intravenous Once   mouth rinse  15 mL Mouth Rinse BID   metoprolol tartrate  50 mg Oral BID   mometasone-formoterol  2 puff Inhalation BID   multivitamin with minerals  1 tablet Oral Daily   polyethylene glycol  34 g Oral Once   senna-docusate  3 tablet Oral BID   sodium chloride flush  10-40 mL Intracatheter Q12H   tamsulosin  0.8 mg Oral QPC supper   Continuous Infusions:  sodium chloride Stopped (05/05/19 1136)   ampicillin-sulbactam (UNASYN) IV 3 g (05/16/19 0541)     LOS: 34 days   The patient is critically ill with multiple organ systems failure and requires high complexity decision making for assessment and support, frequent evaluation and titration of therapies, application of advanced monitoring technologies and extensive interpretation of multiple databases. Critical Care Time devoted to patient care services described in this note  Time spent: 40 minutes     S[place]onia Nutter, Geraldo Docker, MD [facility] Pager [phone]  If 7PM-7AM, please contact night-coverage [email or web address] Password [facility] 05/16/2019, 7:43 AM

## 2019-05-16 NOTE — PMR Pre-admission (Signed)
PMR Admission Coordinator Pre-Admission Assessment  Patient: Robert [name] is an 68 y.o., male MRN: [id number] DOB: 1951-05-23 Height: 6' (182.9 cm) Weight: (No scale on the bed)  Insurance Information HMO: yes    PPO:      PCP:      IPA:      80/20:      OTHER:  PRIMARY: [employer]      Policy#: [id number]      Subscriber: patient CM Name: case auto approved, ref #[id number] with updates due to clinical intake fax 7 days from admission (9/16) at [phone]    Phone#:      Fax#: [phone] Pre-Cert#: [id number] case auto approved     Employer:  Benefits:  Phone #: [phone]     Name:  Eff. Date: 09/11/2017     Deduct: $0 for in network providers      Out of Pocket Max: $3,400 (met $450)      Life Max: n/a CIR: $295/day for days 1-6      SNF: 20 full days Outpatient:      Co-Pay: $10-$40/visit Home Health: 100%      Co-Pay:  DME: 80%     Co-Pay: 20% Providers:  SECONDARY:       Policy#:       Subscriber:  CM Name:       Phone#:      Fax#:  Pre-Cert#:       Employer:  Benefits:  Phone #:      Name:  Eff. Date:      Deduct:       Out of Pocket Max:       Life Max:  CIR:       SNF:  Outpatient:      Co-Pay:  Home Health:       Co-Pay:  DME:      Co-Pay:   Medicaid Application Date:       Case Manager:  Disability Application Date:       Case Worker:   The "Data Collection Information Summary" for patients in Inpatient Rehabilitation Facilities with attached "Privacy Act [place] Records" was provided and verbally reviewed with: Patient  Emergency Contact Information Contact Information    Name Relation Home Work Mobile   [place] Daughter   [phone]      Current Medical History  Patient Admitting Diagnosis: 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  He was found to be significantly hypoxic,  tested positive for COVID-19 and was intubated and transferred to [facility]. His hospital course was complicated by failed attempts at extubation, MRSA pneumonia, septic shock, and agitation.  Pt was successfully extubated on 8/22, but was unfortunately transferred back to ICU on same day due to acute onset of diaphoresis, tachycardia, tachypnea, and confusion.  CXR consistent with aspiration pneumonia.  Pt with elevated troponins and cardiology was consulted.  EKG unchanged from the one on 8/19, ECHO unchanged from prior.  He was started on lasix, cardiology signed off on 9/3.  On 9/7 pt with sudden onset of respiratory failure in the setting of flash pulmonary edema, requiring reintubation and transfer back to ICU.  He was successfully extubated on 9/8 and weaned down to 1L o2 via nasal cannula.  Pt transferred to [facility] on 9/9 for Cardiology consult and trial with CPAP.  Cardiology felt  elevated troponins due to demand ischemia and did not recommend further inpatient treatment.  Therapy evaluations have been completed and pt was recommended for CIR.     Patient's medical records from [facility] and [facility] have been reviewed by the rehabilitation admission coordinator and physician.  Past Medical History  Past Medical History:  Diagnosis Date  . Anxiety   . COPD (chronic obstructive pulmonary disease) ([place])   . Coronary artery disease   . Full dentures   . GERD (gastroesophageal reflux disease)    Rolaids as needed  . High cholesterol   . History of MI (myocardial infarction)   . Hypertension    med. dosage increased 04/2016; has been on BP med. x 10 yrs.  . Incarcerated umbilical hernia 51/8841  . Inguinal hernia 05/2016   bilateral   . Insulin dependent diabetes mellitus ([place])   . Ischemic cardiomyopathy    a. 03/2015 EF 40-45% by LV gram.  . Left leg cellulitis 10/08/2016  . Rheumatoid arthritis(714.0)     Family History   family history includes Colon cancer in his  brother and mother; Heart disease in his father and mother; Hypertension in his mother; Lung cancer in his mother.  Prior Rehab/Hospitalizations Has the patient had prior rehab or hospitalizations prior to admission? Yes  Has the patient had major surgery during 100 days prior to admission? Yes   Current Medications  Current Facility-Administered Medications:  .  0.9 %  sodium chloride infusion, , Intravenous, PRN, Charlynne Cousins, MD, Stopped at 05/20/19 6606 .  acarbose (PRECOSE) tablet 100 mg, 100 mg, Oral, TID WC, Charlynne Cousins, MD, 100 mg at 05/21/19 1826 .  acetaminophen (TYLENOL) tablet 650 mg, 650 mg, Oral, Q6H PRN, Charlynne Cousins, MD, 650 mg at 05/20/19 2137 .  albuterol (VENTOLIN HFA) 108 (90 Base) MCG/ACT inhaler 2 puff, 2 puff, Inhalation, Q6H PRN, Charlynne Cousins, MD .  ALPRAZolam Duanne Moron) tablet 0.5 mg, 0.5 mg, Oral, TID PRN, Charlynne Cousins, MD .  apixaban Arne Cleveland) tablet 5 mg, 5 mg, Oral, BID, Shade, Christine E, RPH, 5 mg at 05/22/19 0935 .  atorvastatin (LIPITOR) tablet 80 mg, 80 mg, Oral, q1800, Charlynne Cousins, MD, 80 mg at 05/21/19 1648 .  bethanechol (URECHOLINE) tablet 10 mg, 10 mg, Oral, BID, Charlynne Cousins, MD, 10 mg at 05/21/19 2300 .  clopidogrel (PLAVIX) tablet 75 mg, 75 mg, Oral, Daily, Charlynne Cousins, MD, 75 mg at 05/22/19 0935 .  collagenase (SANTYL) ointment, , Topical, Daily, Charlynne Cousins, MD .  diphenhydrAMINE (BENADRYL) injection 12.5 mg, 12.5 mg, Intravenous, Q6H PRN, Charlynne Cousins, MD, 12.5 mg at 05/20/19 0028 .  feeding supplement (ENSURE ENLIVE) (ENSURE ENLIVE) liquid 237 mL, 237 mL, Oral, TID BM, Charlynne Cousins, MD, 237 mL at 05/22/19 3016 .  folic acid (FOLVITE) tablet 1 mg, 1 mg, Oral, Daily, Charlynne Cousins, MD, 1 mg at 05/22/19 0109 .  furosemide (LASIX) tablet 40 mg, 40 mg, Oral, Daily, Sherren Mocha, MD, 40 mg at 05/22/19 0936 .  gabapentin (NEURONTIN) capsule 100 mg, 100 mg,  Oral, Q8H, Charlynne Cousins, MD, 100 mg at 05/22/19 0545 .  hydrALAZINE (APRESOLINE) injection 10-40 mg, 10-40 mg, Intravenous, Q4H PRN, Charlynne Cousins, MD, 20 mg at 05/19/19 1708 .  insulin aspart (novoLOG) injection 0-5 Units, 0-5 Units, Subcutaneous, QHS, Charlynne Cousins, MD, 4 Units at 05/21/19 2141 .  insulin aspart (novoLOG) injection 0-9 Units, 0-9 Units, Subcutaneous, TID WC,  Charlynne Cousins, MD, 3 Units at 05/22/19 [phone] .  insulin aspart (novoLOG) injection 3 Units, 3 Units, Subcutaneous, TID WC, Charlynne Cousins, MD, 3 Units at 05/22/19 [phone] .  metoprolol tartrate (LOPRESSOR) tablet 50 mg, 50 mg, Oral, BID, Charlynne Cousins, MD, 50 mg at 05/22/19 0935 .  mometasone-formoterol (DULERA) 200-5 MCG/ACT inhaler 2 puff, 2 puff, Inhalation, BID, Charlynne Cousins, MD, 2 puff at 05/21/19 0747 .  multivitamin with minerals tablet 1 tablet, 1 tablet, Oral, Daily, Charlynne Cousins, MD, 1 tablet at 05/22/19 0935 .  mupirocin ointment (BACTROBAN) 2 %, , Nasal, BID, Charlynne Cousins, MD .  nystatin (MYCOSTATIN/NYSTOP) topical powder, , Topical, TID, Charlynne Cousins, MD .  potassium chloride SA (K-DUR) CR tablet 40 mEq, 40 mEq, Oral, Once, Sherren Mocha, MD .  Resource ThickenUp Clear, , Oral, PRN, Charlynne Cousins, MD .  tamsulosin [facility]) capsule 0.8 mg, 0.8 mg, Oral, QPC supper, Charlynne Cousins, MD, 0.8 mg at 05/21/19 1647  Patients Current Diet:  Diet Order            DIET DYS 2 Room service appropriate? Yes; Fluid consistency: Nectar Thick  Diet effective now              Precautions / Restrictions Precautions Precautions: Fall Precaution Comments: sacral wound Other Brace: B Prevalon boots Restrictions Weight Bearing Restrictions: No   Has the patient had 2 or more falls or a fall with injury in the past year? No  Prior Activity Level Community (5-7x/wk): went out of the house daily, occasional use of cane, driving  Prior  Functional Level Self Care: Did the patient need help bathing, dressing, using the toilet or eating? Independent  Indoor Mobility: Did the patient need assistance with walking from room to room (with or without device)? Independent  Stairs: Did the patient need assistance with internal or external stairs (with or without device)? Independent  Functional Cognition: Did the patient need help planning regular tasks such as shopping or remembering to take medications? Independent  Home Assistive Devices / Equipment Home Assistive Devices/Equipment: Dentures (specify type), Cane (specify quad or straight)(upper and lower) Home Equipment: Cane - single point  Prior Device Use: Indicate devices/aids used by the patient prior to current illness, exacerbation or injury? occasional cane  Current Functional Level Cognition  Overall Cognitive Status: Impaired/Different from baseline Difficult to assess due to: Intubated Current Attention Level: Selective Orientation Level: Oriented X4 Safety/Judgement: Decreased awareness of safety, Decreased awareness of deficits General Comments: pt able to recall he is in the hospital, educated of name of hospital and pt able to recall end of session. pt does require education/cues for safety and of his current deficits    Extremity Assessment (includes Sensation/Coordination)  Upper Extremity Assessment: Generalized weakness RUE Deficits / Details: overall 3+/5 RUE Coordination: decreased fine motor, decreased gross motor LUE Deficits / Details: L RTC insufficiency at baleine  Lower Extremity Assessment: Defer to PT evaluation RLE Deficits / Details: active movements, very weak , decreasd hip internal rotation. LLE Deficits / Details: similar to right in active movement, no hip tightness    ADLs  Overall ADL's : Needs assistance/impaired Eating/Feeding: Supervision/ safety, Set up Eating/Feeding Details (indicate cue type and reason): honey thick  liquids Grooming: Minimal assistance, Sitting Grooming Details (indicate cue type and reason): difficulty reaching hair Upper Body Bathing: Minimal assistance, Sitting Lower Body Bathing: Maximal assistance, Sit to/from stand Lower Body Bathing Details (indicate cue type and reason): Pt ablet  o reach and clean groin area; would most likely benefit form long handled sponge Upper Body Dressing : Moderate assistance, Sitting Lower Body Dressing: Maximal assistance, Sit to/from stand Lower Body Dressing Details (indicate cue type and reason): assist to adjust socks Toileting- Clothing Manipulation and Hygiene: Total assistance Toileting - Clothing Manipulation Details (indicate cue type and reason): condom cath placed. Pt excited about having foley removed Functional mobility during ADLs: +2 for physical assistance, Moderate assistance, Maximal assistance(using Stedy) General ADL Comments: generalized weakness affecting ADL    Mobility  Overal bed mobility: Needs Assistance Bed Mobility: Supine to Sit, Sit to Supine Rolling: Supervision Sidelying to sit: Min assist Supine to sit: Mod assist, +2 for physical assistance, +2 for safety/equipment Sit to supine: Mod assist, +2 for physical assistance, +2 for safety/equipment Sit to sidelying: Min guard, Min assist General bed mobility comments: HOB elevated and use of rail; incr time and cues for technique; assist for LEs and trunk elevation, assist to scoot hips towards EOB    Transfers  Overall transfer level: Needs assistance Transfer via Lift Equipment: Stedy Transfers: Sit to/from Stand Sit to Stand: +2 physical assistance, +2 safety/equipment, Mod assist, From elevated surface General transfer comment: sit<>stand from EOB and from seat of Stedy    Ambulation / Gait / Stairs / Office manager / Balance Dynamic Sitting Balance Sitting balance - Comments: able to reposition self in recliner; S with anterior/posteiror  weight shifts and lateral weight shifts Balance Overall balance assessment: Needs assistance Sitting-balance support: No upper extremity supported, Feet supported Sitting balance-Leahy Scale: Fair Sitting balance - Comments: able to reposition self in recliner; S with anterior/posteiror weight shifts and lateral weight shifts Standing balance support: Bilateral upper extremity supported Standing balance-Leahy Scale: Poor Standing balance comment: while in stedy, able to keep trunk upright with cues to squeeze buttocks    Special needs/care consideration BiPAP/CPAP no CPM no Continuous Drip IV Ampicillin Dialysis no        Days n/a Life Vest no Oxygen on 1L via nasal cannula Special Bed no Trach Size no Wound Vac (area) no      Location n/a Skin  WOC Nurse wound follow up Patient receiving care in [place].  F/U on bilateral buttock and coccyx wounds performed via Telehealth and assistance of primary RN, Josh. Wound type: all three are unstageable Measurement: To be provided by the bedside RN in the flowsheet section Wound MVH:QIONG buttock wound bed is 100% dark brown and yellow. Coccyx and left buttock wounds are 100% yellow. Drainage (amount, consistency, odor) none Periwound: intact Dressing procedure/placement/frequency: continue with Santyl and saline moistened gauze.  If there is not significant improvement next week, it might be a good idea to try Dakin's solution for a short while. Monitor the wound area(s) for worsening of condition such as: Signs/symptoms of infection,  Increase in size,  Development of or worsening of odor, Development of pain, or increased pain at the affected locations.  Notify the medical team if any of these develop.  Bowel mgmt: incontinent, last BM 9/3 Bladder mgmt: continent Diabetic mgmt: yes Behavioral consideration no Chemo/radiation no   Previous Home Environment (from acute therapy documentation) Living Arrangements: Spouse/significant  other, Children Available Help at Discharge: Family Type of Home: House Home Layout: Multi-level, Able to live on main level with bedroom/bathroom Home Access: Stairs to enter Entrance Stairs-Rails: Left Entrance Stairs-Number of Steps: 3 Bathroom Shower/Tub: Chiropodist: Handicapped [place]:  No Additional Comments: info  from previous encounter  Discharge Living Setting Plans for Discharge Living Setting: Lives with (comment)(step-son) Type of Home at Discharge: House Discharge Home Layout: Two level, Able to live on main level with bedroom/bathroom Discharge Home Access: Stairs to enter Entrance Stairs-Rails: Can reach both Entrance Stairs-Number of Steps: 3 Discharge Bathroom Shower/Tub: Tub/shower unit Discharge Bathroom Toilet: Handicapped height Discharge Bathroom Accessibility: Yes How Accessible: Accessible via walker Does the patient have any problems obtaining your medications?: No  Social/Family/Support Systems Anticipated Caregiver: daughter Maudie Mercury), son [name]), and step son Harrell Gave) Anticipated Caregiver's Contact Information: Maudie Mercury [phone] Ability/Limitations of Caregiver: pt states Harrell Gave is mentally handicapped but able to take care of himself and that Maudie Mercury and [name] can help if needed.  They both confirm this.  Caregiver Availability: 24/7 Discharge Plan Discussed with Primary Caregiver: Yes Is Caregiver In Agreement with Plan?: Yes Does Caregiver/Family have Issues with Lodging/Transportation while Pt is in Rehab?: No  Goals/Additional Needs Patient/Family Goal for Rehab: PT/OT/SLP supervision to mod I Expected length of stay: 14-18 days Dietary Needs: dys 1/honey Pt/Family Agrees to Admission and willing to participate: Yes Program Orientation Provided & Reviewed with Pt/Caregiver Including Roles  & Responsibilities: Yes  Decrease burden of Care through IP rehab admission: n/a  Possible  need for SNF placement upon discharge: No. Family prepared to take pt home with 24/7 supervision/min assist if needed.    Patient Condition: I have reviewed medical records from [facility], spoken with CM, and patient, son and daughter. I discussed via phone for inpatient rehabilitation assessment.  Patient will benefit from ongoing PT, OT and SLP, can actively participate in 3 hours of therapy a day 5 days of the week, and can make measurable gains during the admission.  Patient will also benefit from the coordinated team approach during an Inpatient Acute Rehabilitation admission.  The patient will receive intensive therapy as well as Rehabilitation physician, nursing, social worker, and care management interventions.  Due to bladder management, bowel management, safety, skin/wound care, disease management, medication administration, pain management and patient education the patient requires 24 hour a day rehabilitation nursing.  The patient is currently mod +2 with mobility and basic ADLs.  Discharge setting and therapy post discharge at home with home health is anticipated.  Patient has agreed to participate in the Acute Inpatient Rehabilitation Program and will admit today.  Preadmission Screen Completed By:  Michel Santee, PT, DPT 05/22/2019 11:31 AM ______________________________________________________________________   Discussed status with Dr. Naaman Plummer on 05/22/19  at 11:31 AM  and received approval for admission today.  Admission Coordinator:  Michel Santee, PT, DPT time 11:31 AM Sudie Grumbling 05/22/19    Assessment/Plan: Diagnosis: debility after Covid respiratory failure 1. Does the need for close, 24 hr/day Medical supervision in concert with the patient's rehab needs make it unreasonable for this patient to be served in a less intensive setting? Yes 2. Co-Morbidities requiring supervision/potential complications: COPD, CAD, DM, RA CHF 3. Due to bladder management, bowel management,  safety, skin/wound care, disease management, medication administration, pain management and patient education, does the patient require 24 hr/day rehab nursing? Yes 4. Does the patient require coordinated care of a physician, rehab nurse, PT (1-2 hrs/day, 5 days/week), OT (1-2 hrs/day, 5 days/week) and SLP (1-2 hrs/day, 5 days/week) to address physical and functional deficits in the context of the above medical diagnosis(es)? Yes Addressing deficits in the following areas: balance, endurance, locomotion, strength, transferring, bowel/bladder control, bathing, dressing, feeding, grooming, toileting,  cognition, swallowing and psychosocial support 5. Can the patient actively participate in an intensive therapy program of at least 3 hrs of therapy 5 days a week? Yes 6. The potential for patient to make measurable gains while on inpatient rehab is excellent 7. Anticipated functional outcomes upon discharge from inpatients are: modified independent and supervision PT, modified independent and supervision OT, modified independent and supervision SLP 8. Estimated rehab length of stay to reach the above functional goals is: 14-18 days 9. Anticipated D/C setting: Home 10. Anticipated post D/C treatments: [place] therapy 11. Overall Rehab/Functional Prognosis: excellent  MD Signature: Meredith Staggers, MD, [facility] 05/22/2019

## 2019-05-16 NOTE — Progress Notes (Signed)
Inpatient Rehab Admissions:  Inpatient Rehab Consult received.  I spoke with the pt, his daughter, and his son over the phone for rehabilitation assessment and to discuss goals and expectations of an inpatient rehab admission.  They are all open to CIR following acute hospitalization and agree that they are able to provide 24/7 supervision, if needed.  Will open insurance for prior authorization for possible admission early next week, pending approval.   Signed: Shann Medal, PT, DPT Admissions Coordinator [phone] 05/16/19  2:56 PM

## 2019-05-16 NOTE — Progress Notes (Addendum)
Physical Therapy Treatment Patient Details Name: Robert [name] MRN: [id number] DOB: 12-Jun-1951 Today's Date: 05/16/2019    History of Present Illness 68 yo male [place] HTN,/CAD,GERD, DM,RA, T3 compression fx, wrist fx, COPD,ischemic myopathy presented to the hospital and was admitted on 04/11/2019 with shortness of breath, he was found to be significantly hypoxic, tested positive for COVID-19, was intubated and transferred to [facility] for further management. intubated 3x in the course of a month. He was extubated 05/03/2019 and unfortunately developed worsening respiratory failure with increased work of breathing and was transferred back to the ICU. Cardiology consulted with elevated troponins and felt this was due to CHF and hypoxia due to respiratory failure c/w aspiration.     PT Comments    Patient able to tolerate standing x3 reps using stedy (pulls on front bar to come to stand with +2 moderate assist). He is very motivated and continues to make slow progress despite multiple medical set-backs.     Follow Up Recommendations  CIR     Equipment Recommendations  Other (comment)(tba)    Recommendations for Other Services Rehab consult     Precautions / Restrictions Precautions Precautions: Fall Precaution Comments: sacral wound Required Braces or Orthoses: Other Brace(B prevalon boots) Other Brace: B Prevalon boots Restrictions Weight Bearing Restrictions: No    Mobility  Bed Mobility Overal bed mobility: Needs Assistance Bed Mobility: Sidelying to Sit   Sidelying to sit: Min guard       General bed mobility comments: HOB elevated and use of rail; incr time and cues for technique  Transfers Overall transfer level: Needs assistance   Transfers: Sit to/from Stand Sit to Stand: +2 physical assistance;+2 safety/equipment;Mod assist         General transfer comment: from elevated bed, from seat of stedy, and from recliner (built up with pillows)  Ambulation/Gait                  Stairs             Wheelchair Mobility    Modified Rankin (Stroke Patients Only)       Balance Overall balance assessment: Needs assistance Sitting-balance support: No upper extremity supported;Feet supported Sitting balance-Leahy Scale: Fair     Standing balance support: Bilateral upper extremity supported Standing balance-Leahy Scale: Poor Standing balance comment: while in stedy, able to keep trunk upright with cues to squeeze buttocks                            Cognition Arousal/Alertness: Awake/alert Behavior During Therapy: WFL for tasks assessed/performed Overall Cognitive Status: Impaired/Different from baseline Area of Impairment: Attention;Memory;Safety/judgement;Awareness;Problem solving                   Current Attention Level: Selective Memory: Decreased short-term memory;Decreased recall of precautions   Safety/Judgement: Decreased awareness of safety;Decreased awareness of deficits Awareness: Emergent Problem Solving: Slow processing;Requires verbal cues;Requires tactile cues General Comments: unable to recall his swallowing precautions (no straws, thickened liquids) despite discussion earlier in session      Exercises      General Comments        Pertinent Vitals/Pain Pain Assessment: No/denies pain Faces Pain Scale: No hurt    Home Living                      Prior Function            PT Goals (current goals can now  be found in the care plan section) Acute Rehab PT Goals Patient Stated Goal: to get stronger PT Goal Formulation: With patient Time For Goal Achievement: 05/30/19 Potential to Achieve Goals: Fair Progress towards PT goals: Progressing toward goals(slow due to medical complications/setbacks)    Frequency    Min 3X/week      PT Plan Current plan remains appropriate;Frequency needs to be updated    Co-evaluation PT/OT/SLP Co-Evaluation/Treatment: Yes Reason for  Co-Treatment: To address functional/ADL transfers;For patient/therapist safety PT goals addressed during session: Mobility/safety with mobility;Strengthening/ROM        AM-PAC PT "6 Clicks" Mobility   Outcome Measure  Help needed turning from your back to your side while in a flat bed without using bedrails?: A Little Help needed moving from lying on your back to sitting on the side of a flat bed without using bedrails?: A Little Help needed moving to and from a bed to a chair (including a wheelchair)?: A Lot Help needed standing up from a chair using your arms (e.g., wheelchair or bedside chair)?: A Lot Help needed to walk in hospital room?: Total Help needed climbing 3-5 steps with a railing? : Total 6 Click Score: 12    End of Session Equipment Utilized During Treatment: Oxygen Activity Tolerance: Patient tolerated treatment well Patient left: with call bell/phone within reach;in chair;with chair alarm set;with nursing/sitter in room Nurse Communication: Mobility status;Need for lift equipment(stedy vs maximove (pad under pt)) PT Visit Diagnosis: Muscle weakness (generalized) (M62.81)     Time: 1010-1033 PT Time Calculation (min) (ACUTE ONLY): 23 min  Charges:  $Therapeutic Activity: 8-22 mins                       Barry Brunner, PT       [place] [facility] Mon[facility] Engebretsen 05/16/2019, 1:21 PM

## 2019-05-16 NOTE — Progress Notes (Signed)
Occupational Therapy Treatment Patient Details Name: Robert [name] MRN: [id number] DOB: 1951/07/20 Today's Date: 05/16/2019    History of present illness 68 yo male [place] HTN,/CAD,GERD, DM,RA, T3 compression fx, wrist fx, COPD,ischemic myopathy presented to the hospital and was admitted on 04/11/2019 with shortness of breath, he was found to be significantly hypoxic, tested positive for COVID-19, was intubated and transferred to [facility] for further management. intubated 3x in the course of a month. He was extubated 05/03/2019 and unfortunately developed worsening respiratory failure with increased work of breathing and was transferred back to the ICU. Cardiology consulted with elevated troponins and felt this was due to CHF and hypoxia due to respiratory failure c/w aspiration.    OT comments  Pt able to mobilize to recliner with +2 Mod A using Stedy from elevated surface. Mod A with UB ADL and Max A with LB ADL. VSS throughout session.  Pt overall weaker after ICU admission, however, continues to be an excellent CIR candidate. Pt very motivated to participate with OT and is able to tolerate 3 hrs therapy/day. Pt asking about CIR. Will continue to follow acutely.  Recommend pt get OOB daily with nursing using Stedy +2 A Recommend Geomat/pressure relieving cushion and air mattress - charge nurse notified.   Follow Up Recommendations  CIR;Supervision/Assistance - 24 hour    Equipment Recommendations  3 in 1 bedside commode    Recommendations for Other Services Rehab consult    Precautions / Restrictions Precautions Precautions: Fall Precaution Comments: sacral wound Required Braces or Orthoses: Other Brace(B prevalon boots) Other Brace: B Prevalon boots Restrictions Weight Bearing Restrictions: No       Mobility Bed Mobility Overal bed mobility: Needs Assistance Bed Mobility: Sidelying to Sit   Sidelying to sit: Min assist       General bed mobility comments: HOB elevated and use  of rail; incr time and cues for technique  Transfers Overall transfer level: Needs assistance   Transfers: Sit to/from Stand Sit to Stand: +2 physical assistance;+2 safety/equipment;Mod assist         General transfer comment: from elevated bed, from seat of stedy, and from recliner (built up with pillows)    Balance Overall balance assessment: Needs assistance Sitting-balance support: No upper extremity supported;Feet supported Sitting balance-Leahy Scale: Fair     Standing balance support: Bilateral upper extremity supported Standing balance-Leahy Scale: Poor Standing balance comment: while in stedy, able to keep trunk upright with cues to squeeze buttocks                           ADL either performed or assessed with clinical judgement   ADL Overall ADL's : Needs assistance/impaired Eating/Feeding: Supervision/ safety;Set up Eating/Feeding Details (indicate cue type and reason): honey thick liquids Grooming: Minimal assistance;Sitting   Upper Body Bathing: Minimal assistance;Sitting   Lower Body Bathing: Maximal assistance;Sit to/from stand   Upper Body Dressing : Moderate assistance;Sitting   Lower Body Dressing: Maximal assistance;Sit to/from stand               Functional mobility during ADLs: +2 for physical assistance;Moderate assistance;Maximal assistance(using Stedy) General ADL Comments: generalized weakness affecting ADL     Vision   Additional Comments: difficulty seeing - does not have glasses   Perception     Praxis      Cognition Arousal/Alertness: Awake/alert Behavior During Therapy: WFL for tasks assessed/performed Overall Cognitive Status: Impaired/Different from baseline Area of Impairment: Attention;Memory;Safety/judgement;Awareness;Problem solving  Current Attention Level: Selective Memory: Decreased short-term memory;Decreased recall of precautions   Safety/Judgement: Decreased awareness of  safety;Decreased awareness of deficits Awareness: Emergent Problem Solving: Slow processing;Requires verbal cues;Requires tactile cues General Comments: unable to recall his swallowing precautions (no straws, thickened liquids) despite discussion earlier in session        Exercises Exercises: General Upper Extremity General Exercises - Upper Extremity Shoulder Flexion: AROM;Strengthening;Both;15 reps;Supine Shoulder Extension: Strengthening;Both;15 reps;Supine Elbow Flexion: Strengthening;Both;15 reps;Supine Elbow Extension: Strengthening;Both;15 reps;Supine   Shoulder Instructions       General Comments      Pertinent Vitals/ Pain       Pain Assessment: Faces Faces Pain Scale: Hurts little more Pain Location: buttock Pain Descriptors / Indicators: Discomfort;Grimacing Pain Intervention(s): Limited activity within patient's tolerance;Repositioned  Home Living                                          Prior Functioning/Environment              Frequency  Min 3X/week        Progress Toward Goals  OT Goals(current goals can now be found in the care plan section)  Progress towards OT goals: Progressing toward goals  Acute Rehab OT Goals Patient Stated Goal: to get stronger OT Goal Formulation: With patient Time For Goal Achievement: 05/23/19 Potential to Achieve Goals: Good ADL Goals Pt Will Perform Eating: with set-up;with supervision;sitting;with adaptive utensils Pt Will Perform Grooming: with set-up;with supervision;sitting Pt Will Perform Upper Body Bathing: with set-up;with supervision;sitting Pt/caregiver will Perform Home Exercise Program: Increased strength;Both right and left upper extremity;With theraband;With written HEP provided;With minimal assist Additional ADL Goal #1: Pt will complete sit - stand with Mod A +2 in preparation for toilet transfers Additional ADL Goal #2: Pt will participate in 15 minute ADL task with SpO2  remaining above 90.  Plan Discharge plan remains appropriate    Co-evaluation    PT/OT/SLP Co-Evaluation/Treatment: Yes Reason for Co-Treatment: To address functional/ADL transfers PT goals addressed during session: Mobility/safety with mobility;Strengthening/ROM OT goals addressed during session: ADL's and self-care;Strengthening/ROM      AM-PAC OT "6 Clicks" Daily Activity     Outcome Measure   Help from another person eating meals?: A Little Help from another person taking care of personal grooming?: A Little Help from another person toileting, which includes using toliet, bedpan, or urinal?: A Lot Help from another person bathing (including washing, rinsing, drying)?: A Lot Help from another person to put on and taking off regular upper body clothing?: A Lot Help from another person to put on and taking off regular lower body clothing?: A Lot 6 Click Score: 14    End of Session Equipment Utilized During Treatment: Oxygen  OT Visit Diagnosis: Other abnormalities of gait and mobility (R26.89);Muscle weakness (generalized) (M62.81);Other symptoms and signs involving cognitive function;Pain Pain - part of body: (buttocks)   Activity Tolerance Patient tolerated treatment well   Patient Left in chair;with call bell/phone within reach;with chair alarm set   Nurse Communication Mobility status;Need for lift equipment(Stedy; If too fatigued, use Maximove)        [id number] OT Time Calculation (min): 41 min  Charges: OT General Charges $OT Visit: 1 Visit OT Treatments $Self Care/Home Management : 23-37 mins  Maurie Boettcher, OT/L   Acute OT Clinical Specialist [place] Pager [phone] Office [phone]    [facility] 05/16/2019, 2:29  PM

## 2019-05-16 NOTE — Plan of Care (Addendum)
Updated patient's daughter [name]. All questions answered.   Patient sitting up in chair. Offered to put patient back to bed because of wound to backside. Patient wants to sit up to eat lunch then go back to bed.   13:00 patient transferred back to bed with lift. Patient encouraged to use IS and flutter valve.   18:00 patient transferred to air mattress bed.

## 2019-05-16 NOTE — Progress Notes (Signed)
  Speech Language Pathology Treatment: Dysphagia  Patient Details Name: Robert [name] MRN: [id number] DOB: December 14, 1950 Today's Date: 05/16/2019 Time: [id number] SLP Time Calculation (min) (ACUTE ONLY): 14 min  Assessment / Plan / Recommendation Clinical Impression  Visited pt at the end of lunch meal. He wanted more to drink and was willing to eat another pureed item on tray that he had left. Pt still observed to be quite impulsive with puree; heaped spoon, rapid bites, loading mouth prior to swallow. Pt responded to verbal cue to slow rate, bolus size and follow bites with sips. He also preferred drinking his honey thick coke with a spoon as long as ice was present and he tolerated this very well. He again reiterated that his vocal quality was hoarse at baseline. Also suspect there was some baseline dysphagia. Pt to continue current diet and recommendations. Would not advance until pt can be given closer supervision and assist with meals on a regular basis, likely at next level of care. But will consider FEES for further objective testing.   HPI HPI: 68 year old male with history of COPD, CAD, diabetes mellitus, ischemic cardiomyopathy, RA, presented to the hospital and was admitted on 04/11/2019 with shortness of breath, he was found to be significantly hypoxic, tested positive for COVID-19, was intubated and transferred to [facility] for further management. Intubated 8/1-8/2, reintubated 8/3-8/22.       SLP Plan  Continue with current plan of care       Recommendations  Diet recommendations: Dysphagia 1 (puree);Honey-thick liquid Liquids provided via: Cup;No straw;Teaspoon Medication Administration: Whole meds with puree Supervision: Patient able to self feed Compensations: Multiple dry swallows after each bite/sip;Clear throat intermittently Postural Changes and/or Swallow Maneuvers: Seated upright 90 degrees                Oral Care Recommendations: Oral care BID Follow up Recommendations:  Inpatient Rehab SLP Visit Diagnosis: Dysphagia, oropharyngeal phase (R13.12) Plan: Continue with current plan of care       GO               [name], MA [place] Pager [phone] Office [phone]  [name] 05/16/2019, 1:28 PM

## 2019-05-16 NOTE — Progress Notes (Signed)
 Nutrition Follow-up  DOCUMENTATION CODES:   Obesity unspecified  INTERVENTION:   Ensure Enlive po TID, each supplement provides 350 kcal and 20 grams of protein- pt prefers strawberry flavor  Pt receiving Hormel Shake daily with Breakfast which provides 520 kcals and 22 g of protein  Magic cup TID with meals, each supplement provides 290 kcal and 9 grams of protein, automatically on meal trays to optimize nutritional intake.   Continue MVI with Minerals  If intake does not improve with interventions recommend consider short term feeding tube to improve nutrition and aid in healing.   NUTRITION DIAGNOSIS:   Inadequate oral intake related to poor appetite(loss of taste) as evidenced by per patient/family report(PO intake documented as <25%).  Being addressed via supplements  GOAL:   Patient will meet greater than or equal to 90% of their needs  Progressing  MONITOR:   PO intake, Supplement acceptance, Diet advancement, Skin  REASON FOR ASSESSMENT:   Ventilator, Consult Enteral/tube feeding initiation and management  ASSESSMENT:   68 yo male admitted with ARDS from COVID-19 pneumonia. PMH includes HLD, anxiety, ischemic cardiomyopathy, CAD, COPD, RA, HTN, DM-on insulin.  8/2 - 8/22 intubated 8/24 pt on room air, passed MBS just ordered Dysphagia 3 with nectar thick liquids, Cortrak removed by RN for [facility] 9/02 Diet downgraded to Dysphagia I, Honey  Pt working with PT OT and SLP; PT recommending CIR.  Recorded po intake 25-75% of meals; pt taking some Ensure Enlive per [facility]  Limited with options for oral nutrition supplements give current diet consistency of Honey Thick liquids; Magic Cup ordered on all meal trays. Pt cannot have Pro-Stat protein modular at present as nectar thick at baseline and cannot be thickened.   Labs: Creatine 1.34, BUN wdl, CBGs 124-384 Meds: ss novolog, lantus   Diet Order:   Diet Order            DIET - DYS 1 Room service appropriate?  Yes; Fluid consistency: Honey Thick  Diet effective now              EDUCATION NEEDS:   Education needs have been addressed  Skin:  Skin Assessment: Skin Integrity Issues: Skin Integrity Issues:: Stage III DTI: sacrum, buttocks Stage III: buttocks Unstageable: buttocks  Last BM:  9/3  Height:   Ht Readings from Last 1 Encounters:  05/07/19 6' (1.829 m)    Weight:   Wt Readings from Last 1 Encounters:  03/25/19 106.6 kg    Ideal Body Weight:  80.9 kg  BMI:  Body mass index is 31.72 kg/m.  Estimated Nutritional Needs:   Kcal:  2400-2600  Protein:  130-160 grams  Fluid:  2 L     Raudel Bazen MS, RDN, LDN, CNSC [phone] Pager  [phone] Weekend/On-Call Pager

## 2019-05-17 DIAGNOSIS — M05711 Rheumatoid arthritis with rheumatoid factor of right shoulder without organ or systems involvement: Secondary | ICD-10-CM

## 2019-05-17 LAB — COMPREHENSIVE METABOLIC PANEL
ALT: 68 U/L — ABNORMAL HIGH (ref 0–44)
AST: 37 U/L (ref 15–41)
Albumin: 2.2 g/dL — ABNORMAL LOW (ref 3.5–5.0)
Alkaline Phosphatase: 144 U/L — ABNORMAL HIGH (ref 38–126)
Anion gap: 10 (ref 5–15)
BUN: 25 mg/dL — ABNORMAL HIGH (ref 8–23)
CO2: 32 mmol/L (ref 22–32)
Calcium: 8.5 mg/dL — ABNORMAL LOW (ref 8.9–10.3)
Chloride: 95 mmol/L — ABNORMAL LOW (ref 98–111)
Creatinine, Ser: 1.52 mg/dL — ABNORMAL HIGH (ref 0.61–1.24)
GFR calc Af Amer: 55 mL/min — ABNORMAL LOW (ref >60)
GFR calc non Af Amer: 47 mL/min — ABNORMAL LOW (ref >60)
Glucose, Bld: 175 mg/dL — ABNORMAL HIGH (ref 70–99)
Potassium: 4.3 mmol/L (ref 3.5–5.1)
Sodium: 137 mmol/L (ref 135–145)
Total Bilirubin: 0.5 mg/dL (ref 0.3–1.2)
Total Protein: 6.6 g/dL (ref 6.5–8.1)

## 2019-05-17 LAB — GLUCOSE, CAPILLARY
Glucose-Capillary: 225 mg/dL — ABNORMAL HIGH (ref 70–99)
Glucose-Capillary: 147 mg/dL — ABNORMAL HIGH (ref 70–99)
Glucose-Capillary: 286 mg/dL — ABNORMAL HIGH (ref 70–99)
Glucose-Capillary: 328 mg/dL — ABNORMAL HIGH (ref 70–99)
Glucose-Capillary: 221 mg/dL — ABNORMAL HIGH (ref 70–99)

## 2019-05-17 LAB — CBC
HCT: 26.2 % — ABNORMAL LOW (ref 39.0–52.0)
Hemoglobin: 8.3 g/dL — ABNORMAL LOW (ref 13.0–17.0)
MCH: 29.7 pg (ref 26.0–34.0)
MCHC: 31.7 g/dL (ref 30.0–36.0)
MCV: 93.9 fL (ref 80.0–100.0)
Platelets: 159 10*3/uL (ref 150–400)
RBC: 2.79 MIL/uL — ABNORMAL LOW (ref 4.22–5.81)
RDW: 16 % — ABNORMAL HIGH (ref 11.5–15.5)
WBC: 5.9 10*3/uL (ref 4.0–10.5)
nRBC: 0 % (ref 0.0–0.2)

## 2019-05-17 LAB — MAGNESIUM: Magnesium: 1.7 mg/dL (ref 1.7–2.4)

## 2019-05-17 MED ORDER — MAGNESIUM OXIDE 400 (241.3 MG) MG PO TABS
400.0000 mg | ORAL_TABLET | Freq: Once | ORAL | Status: AC
  Administered 2019-05-17: 13:00:00 400 mg via ORAL
  Filled 2019-05-17: qty 1

## 2019-05-17 MED ORDER — INSULIN ASPART 100 UNIT/ML ~~LOC~~ SOLN
8.0000 [IU] | Freq: Three times a day (TID) | SUBCUTANEOUS | Status: DC
  Administered 2019-05-17 – 2019-05-18 (×2): 8 [IU] via SUBCUTANEOUS

## 2019-05-17 NOTE — Plan of Care (Addendum)
Updated patient's daughter [name], all questions answered.   14:00 patient set up in recliner with lift.   16:00 patient assisted back to bed with lift.

## 2019-05-17 NOTE — Progress Notes (Addendum)
PROGRESS NOTE    Robert [name]  [id number] DOB: 1951-09-05 DOA: 04/11/2019 PCP: Redmond School, MD   Brief Narrative:  68 year old WM PMHx COPD, CAD, diabetes mellitus, ischemic cardiomyopathy, RA,   Presented to the hospital and was admitted on 04/11/2019 with shortness of breath, he was found to be significantly hypoxic, tested positive for COVID-19, was intubated and transferred to [facility] for further management.  Hospital course complicated by persistent encephalopathy and difficulty weaning due to tachypnea and agitation, he failed extubation once on 8/2.  Hospital course was also complicated by MRSA pneumonia with septic shock at the beginning of August status post completed treatment. Patient eventually was eventually successfully extubated on 8/22 and transferred to the floor on 8/24   Subjective: 9/5 A/O x4, negative CP, positive S OB, negative abdominal pain.  Patient ready for discharge    Assessment & Plan:   Principal Problem:   Pneumonia due to COVID-19 virus Active Problems:   Rheumatoid arthritis ([place])   Hyperlipidemia   COPD (chronic obstructive pulmonary disease) (HCC)   Controlled type 2 diabetes mellitus with diabetic peripheral angiopathy without gangrene, with long-term current use of insulin (HCC)   Essential hypertension   Chronic combined systolic and diastolic CHF (congestive heart failure) (HCC)   Ischemic cardiomyopathy   Coronary artery disease   Dyslipidemia (high LDL; low HDL)   Acute respiratory failure with hypoxemia (HCC)   COVID-19 virus detected   Acute respiratory distress syndrome (ARDS) due to COVID-19 virus   Atrial fibrillation with RVR (HCC)   COPD (chronic obstructive pulmonary disease) with emphysema (HCC)   AKI (acute kidney injury) ([place])   Diabetes mellitus type 2, uncontrolled, with complications (HCC)   Pressure injury of skin   Staphylococcal pneumonia ([place])   HCAP (healthcare-associated pneumonia)   Aspiration pneumonia of  both lower lobes due to gastric secretions (HCC)   MRSA pneumonia (HCC)   Severe sepsis with septic shock (HCC)   NSTEMI (non-ST elevated myocardial infarction) ([place])   Acute Hypoxic Respiratory Failure with hypoxia/ Covid-19 pneumonia/ COPD exacerbation -Patient continues to smoke 1.5 PPD until admission to the hospital - Titrate O2 to maintain SPO2 89 to 93% - Currently on 8 L/min O2, SPO2 92% - Completed course of Remdesivir - Completed course of Decadron/Solu-Medrol - Completed Actemra 8/1 - Flutter valve - Albuterol QID - PT/OT consult pending -Ambulate patient every shift -We will need to obtain ambulatory SPO2 was prior to discharge  Aspiration pneumonia -9/1 PCXR shows continued aspiration pneumonia see below -Complete 5-day course of Unasyn -Speech evaluated patient dysphagia 1 diet with honey thick liquid.  MRSA /septic shock -Off pressors - Completed course antibiotic  NSTEMI/CAD - Hx CAD -Known chronic total occlusion of RCA, 60% stenosis proximal LAD and 60% stenosis ostial first diagonal artery. 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 -Patient's troponin troponin trending up significantly 12>221>1236, -Medical management per cardiology -Strict in and out -23.4 L -Daily weight Filed Weights   05/06/19 0500 05/07/19 0235  Weight: 111 kg 106.1 kg  - 9/5 Lasix 40 mg daily ( Hold creatinine trending up) - Hydralazine PRN - Metoprolol 50 mg BID  Acute on chronic systolic and diastolic CHF - See NSTEMI  Paroxysmal atrial fib with RVR -Currently NSR - See NSTEMI  Diabetes type 2 uncontrolled with complication (neuropathy, vascular disease)  - 7/31 hemoglobin A1c= 7.4 -9/3 increase Lantus 22 units -9/3 increase resistant SSI -9/5 NovoLog 8 units qac -Lipid panel pending  Deconditioning/ICU induced muscle wasting -9/3 PT OT  consult pending  Hypernatremia -Resolved  Hypokalemia - Potassium goal> 4  Hypomagnesmia - Magnesium goal>2  -Magnesium oxide 400 mg  Acute kidney injury (baseline Cr 0.80) Lab Results  Component Value Date   CREATININE 1.52 (H) 05/17/2019   CREATININE 1.34 (H) 05/16/2019   CREATININE 1.31 (H) 05/15/2019  - Not yet back to baseline  Acute  urinary retention - Patient has had multiple voiding trial failures.  Last one was 9/2 overnight. - Patient with in and out cath x2 overnight bloody with blood clots. - Heparin has been DC'd, reinsert Foley if urine does not clear will require urology consult - Continue Flomax 0.8 mg daily  ETT clotting/bleeding from subclavian line -ET tube clotted exchanged on 8/12 -Resolved s/p discontinuation of heparin on 9/3  Thrombocytopenia - Improving continue to monitor  RA -He is on Actemra and methotrexate at home, currently on hold  Pressure ulcer of medial sacrum bilateral buttocks -Wound care service consulted, will follow recommendations Pressure Injury 04/17/19 Sacrum Medial Deep Tissue Injury - Purple or maroon localized area of discolored intact skin or blood-filled blister due to damage of underlying soft tissue from pressure and/or shear. dark purple with some skin sloughing on lowe (Active)  04/17/19 0730  Location: Sacrum  Location Orientation: Medial  Staging: Deep Tissue Injury - Purple or maroon localized area of discolored intact skin or blood-filled blister due to damage of underlying soft tissue from pressure and/or shear.  Wound Description (Comments): dark purple with some skin sloughing on lower sacral area  Present on Admission: No     Pressure Injury 04/17/19 Buttocks Right;Lower Unstageable - Full thickness tissue loss in which the base of the ulcer is covered by slough (yellow, tan, gray, green or brown) and/or eschar (tan, brown or black) in the wound bed. (Active)  04/17/19 0730  Location: Buttocks  Location Orientation: Right;Lower  Staging: Unstageable - Full thickness tissue loss in which the base of the ulcer is covered  by slough (yellow, tan, gray, green or brown) and/or eschar (tan, brown or black) in the wound bed.  Wound Description (Comments):   Present on Admission: No     Pressure Injury 04/17/19 Buttocks Left;Lower WOC updated to Stage 3 on 05/06/19 at the time of assessment (Active)  04/17/19 0730  Location: Buttocks  Location Orientation: Left;Lower  Staging:   Wound Description (Comments): WOC updated to Stage 3 on 05/06/19 at the time of assessment  Present on Admission: No    Goals of care - 9/4 PT/OT consult.  Patient with ischemic cardiomyopathy, s/p COVID pneumonia evaluate for SNF vs CIR.  Work with patient daily.      DVT prophylaxis: SCD Code Status: Full Family Communication: 9/5 spoke with Dortha Kern (daughter) counseled her on plan of care.  Answered all questions  Disposition Plan: TBD   Consultants:  PCCM     Procedures/Significant Events:  8/1 admission to [facility] ICU 8/2 extubated 8/3 early AM agitated, placed on BIPAP, required re-intubation 8/4 started on steroids for COPD exacerbation 8/8 septic shock requiring pressor support 8/9 off pressors, respiratory cultures +s.aureus 8/10 persistent encephalopathy off sedation, CT head ordered 8/11 encephalopathy improved 8/12 difficulty weaning due to tachypnea, ETT exchanged due to clot burden 8/13 PICC line inserted and subclavian central line removed 8/14 Antibiotics coverage broaden due to persistent fever and resp distress, pan cultured  8/19 more alert, tolerating pressure support 8/20 failed voiding trial, Foley needed to be reinserted 8/22 successfully extubated  8/24 transfer to the floor 9/1  transferred to ICU given worsening respiratory failure, increased work of breathing 9/1 PCXR:-Opacification of the lower right hemithorax may represent combination of pleural fluid and airspace disease, pneumonia vs aspiration. -Increased left pleural effusion since prior exam.  -Progressive interstitial  opacities from prior likely combination of pulmonary edema and atypical pneumonia. 9/2 echocardiogram: LVEF = 50-55%.-mildly dilated. moderate basal septal hypertrophy.  -Left ventricular diastolic Doppler parameters are consistent with restrictive filling.  Mitral valve regurgitation: mild -moderate .   I have personally reviewed and interpreted all radiology studies and my findings are as above.  VENTILATOR SETTINGS: Nasal cannula 1 L/min SPO2 90%     Cultures 7/31 blood negative final 7/31 RIGHT arm negative 7/31 urine negative 8/1 tracheal aspirate respiratory flora 8/7 blood right AC negative 8/7 blood RIGHT hand negative 8/7 tracheal aspirate positive MRSA 8/8 urine negative 8/14 blood LEFT hand negative 8/14 blood RIGHT hand negative 8/15 tracheal aspirate positive MRSA 9/1 MRSA by PCR positive      Antimicrobials: Anti-infectives (From admission, onward)   Start     Stop   05/14/19 0430  Ampicillin-Sulbactam (UNASYN) 3 g in sodium chloride 0.9 % 100 mL IVPB     05/18/19 2359   04/30/19 2200  vancomycin (VANCOCIN) 1,500 mg in sodium chloride 0.9 % 500 mL IVPB     05/03/19 1900   04/29/19 0900  vancomycin (VANCOCIN) 1,500 mg in sodium chloride 0.9 % 500 mL IVPB  Status:  Discontinued     04/29/19 2324   04/27/19 1800  ceFEPIme (MAXIPIME) 2 g in sodium chloride 0.9 % 100 mL IVPB  Status:  Discontinued     04/29/19 1316   04/26/19 1630  vancomycin (VANCOCIN) 1,750 mg in sodium chloride 0.9 % 500 mL IVPB  Status:  Discontinued     04/29/19 0847   04/25/19 1400  vancomycin (VANCOCIN) IVPB 1000 mg/200 mL premix     04/25/19 1456   04/25/19 1300  ceFEPIme (MAXIPIME) 2 g in sodium chloride 0.9 % 100 mL IVPB  Status:  Discontinued     04/27/19 1319   04/23/19 1921  vancomycin variable dose per unstable renal function (pharmacist dosing)  Status:  Discontinued     04/25/19 1252   04/22/19 2200  dextrose 5 % 250 mL with vancomycin (VANCOCIN) 1,250 mg infusion  Status:   Discontinued     04/23/19 1921   04/20/19 1400  vancomycin (VANCOCIN) 1,250 mg in sodium chloride 0.9 % 250 mL IVPB  Status:  Discontinued     04/20/19 1215   04/20/19 1400  vancomycin (VANCOCIN) 1,250 mg in sodium chloride 0.9 % 250 mL IVPB  Status:  Discontinued     04/20/19 1220   04/20/19 1400  Vancomycin HCl in Dextrose 1.25-5 GM/250ML-% SOLN 1,250 mg  Status:  Discontinued     04/20/19 1221   04/20/19 1400  Vancomycin 1250mg  in D5W  Status:  Discontinued     04/20/19 1233   04/20/19 1400  Vancomycin 1250mg  in D5W  Status:  Discontinued     04/22/19 1324   04/19/19 2200  piperacillin-tazobactam (ZOSYN) IVPB 3.375 g  Status:  Discontinued     04/21/19 1533   04/19/19 1430  vancomycin (VANCOCIN) 2,000 mg in sodium chloride 0.9 % 500 mL IVPB     04/19/19 1806   04/19/19 1400  piperacillin-tazobactam (ZOSYN) IVPB 3.375 g     04/19/19 1530   04/19/19 1000  azithromycin (ZITHROMAX) tablet 250 mg  Status:  Discontinued     04/20/19  1655   04/18/19 1300  azithromycin (ZITHROMAX) tablet 500 mg     04/18/19 1351   04/13/19 1000  remdesivir 100 mg in sodium chloride 0.9 % 250 mL IVPB     04/16/19 1103   04/12/19 1000  remdesivir 200 mg in sodium chloride 0.9 % 250 mL IVPB     04/12/19 1317   04/12/19 0600  ceFEPIme (MAXIPIME) 2 g in sodium chloride 0.9 % 100 mL IVPB  Status:  Discontinued     04/15/19 1452   04/12/19 0200  vancomycin (VANCOCIN) IVPB 750 mg/150 ml premix  Status:  Discontinued     04/12/19 1455   04/11/19 2130  ceFEPIme (MAXIPIME) 2 g in sodium chloride 0.9 % 100 mL IVPB     04/11/19 2257   04/11/19 2130  metroNIDAZOLE (FLAGYL) IVPB 500 mg     04/11/19 2356   04/11/19 2130  vancomycin (VANCOCIN) IVPB 1000 mg/200 mL premix     04/12/19 0105       Devices    LINES / TUBES:      Continuous Infusions: . sodium chloride Stopped (05/05/19 1136)  . ampicillin-sulbactam (UNASYN) IV 3 g (05/17/19 0533)     Objective: Vitals:   05/16/19 2343 05/17/19 0510  05/17/19 0654 05/17/19 0831  BP: 105/60 129/78  134/71  Pulse: 77 90  94  Resp: 19 (!) 26 19 (!) 24  Temp:  97.6 F (36.4 C)  98 F (36.7 C)  TempSrc:  Oral  Oral  SpO2: 96% 96%  90%  Weight:      Height:        Intake/Output Summary (Last 24 hours) at 05/17/2019 [id number] Last data filed at 05/17/2019 0550 Gross per 24 hour  Intake 1240.06 ml  Output 2750 ml  Net -1509.94 ml   Filed Weights   05/06/19 0500 05/07/19 0235  Weight: 111 kg 106.1 kg   Physical Exam:  General: A/O x4, positive acute respiratory distress Eyes: negative scleral hemorrhage, negative anisocoria, negative icterus ENT: Negative Runny nose, negative gingival bleeding, Neck:  Negative scars, masses, torticollis, lymphadenopathy, JVD Lungs: Clear to auscultation bilaterally without wheezes or crackles Cardiovascular: Regular rate and rhythm without murmur gallop or rub normal S1 and S2 Abdomen: negative abdominal pain, nondistended, positive soft, bowel sounds, no rebound, no ascites, no appreciable mass Extremities: No significant cyanosis, clubbing, or edema bilateral lower extremities Skin: Negative rashes, lesions, ulcers Psychiatric:  Negative depression, negative anxiety, negative fatigue, negative mania  Central nervous system:  Cranial nerves II through XII intact, tongue/uvula midline, all extremities muscle strength 5/5, sensation intact throughout,  negative dysarthria, negative expressive aphasia, negative receptive aphasia.       Data Reviewed: Care during the described time interval was provided by me .  I have reviewed this patient's available data, including medical history, events of note, physical examination, and all test results as part of my evaluation.   CBC: Recent Labs  Lab 05/13/19 2220  05/14/19 0801 05/14/19 2340 05/15/19 0610 05/16/19 0139 05/17/19 0114  WBC 10.2  --  6.7 7.1 6.5 6.0 5.9  NEUTROABS 7.4  --   --   --   --   --   --   HGB 10.7*   < > 8.9* 8.9* 8.5* 8.0* 8.3*   HCT 34.1*   < > 27.7* 27.8* 26.5* 25.1* 26.2*  MCV 95.8  --  94.9 94.2 93.6 94.7 93.9  PLT 175  --  127* 132* 147* 149* 159   < > =  values in this interval not displayed.   Basic Metabolic Panel: Recent Labs  Lab 05/13/19 2220 05/13/19 2305 05/14/19 0801 05/15/19 0610 05/16/19 0139 05/17/19 0114  NA 138 136 137 139 137 137  K 4.6 4.4 4.3 4.1 3.5 4.3  CL 99  --  97* 100 97* 95*  CO2 29  --  31 30 30  32  GLUCOSE 356*  --  154* 196* 191* 175*  BUN 24*  --  26* 24* 23 25*  CREATININE 1.36*  --  1.30* 1.31* 1.34* 1.52*  CALCIUM 9.3  --  9.3 8.9 8.6* 8.5*  MG 2.0  --   --   --  1.7 1.7   GFR: Estimated Creatinine Clearance: 60.2 mL/min (A) (by C-G formula based on SCr of 1.52 mg/dL (H)). Liver Function Tests: Recent Labs  Lab 05/13/19 2220 05/14/19 0801 05/15/19 0610 05/16/19 0139 05/17/19 0114  AST 36 31 26 31  37  ALT 73* 64* 54* 58* 68*  ALKPHOS 187* 156* 140* 132* 144*  BILITOT 0.4 0.1* 0.4 0.2* 0.5  PROT 7.3 6.6 6.4* 6.3* 6.6  ALBUMIN 2.6* 2.4* 2.2* 2.0* 2.2*   No results for input(s): LIPASE, AMYLASE in the last 168 hours. No results for input(s): AMMONIA in the last 168 hours. Coagulation Profile: No results for input(s): INR, PROTIME in the last 168 hours. Cardiac Enzymes: No results for input(s): CKTOTAL, CKMB, CKMBINDEX, TROPONINI in the last 168 hours. BNP (last 3 results) No results for input(s): PROBNP in the last 8760 hours. HbA1C: No results for input(s): HGBA1C in the last 72 hours. CBG: Recent Labs  Lab 05/16/19 0436 05/16/19 0731 05/16/19 1141 05/16/19 1631 05/16/19 2109  GLUCAP 124* 133* 384* 303* 208*   Lipid Profile: No results for input(s): CHOL, HDL, LDLCALC, TRIG, CHOLHDL, LDLDIRECT in the last 72 hours. Thyroid Function Tests: No results for input(s): TSH, T4TOTAL, FREET4, T3FREE, THYROIDAB in the last 72 hours. Anemia Panel: No results for input(s): VITAMINB12, FOLATE, FERRITIN, TIBC, IRON, RETICCTPCT in the last 72 hours. Urine  analysis:    Component Value Date/Time   COLORURINE YELLOW 04/25/2019 0826   APPEARANCEUR HAZY (A) 04/25/2019 0826   LABSPEC 1.015 04/25/2019 0826   PHURINE 5.0 04/25/2019 0826   GLUCOSEU 150 (A) 04/25/2019 0826   HGBUR LARGE (A) 04/25/2019 0826   BILIRUBINUR NEGATIVE 04/25/2019 0826   KETONESUR NEGATIVE 04/25/2019 0826   PROTEINUR 30 (A) 04/25/2019 0826   UROBILINOGEN 0.2 12/22/2011 2130   NITRITE NEGATIVE 04/25/2019 0826   LEUKOCYTESUR NEGATIVE 04/25/2019 0826   Sepsis Labs: @LABRCNTIP (procalcitonin:4,lacticidven:4)  ) Recent Results (from the past 240 hour(s))  SARS Coronavirus 2 [facility] order, Performed in [place] hospital lab)     Status: Abnormal   Collection Time: 05/07/19  5:23 PM  Result Value Ref Range Status   SARS Coronavirus 2 POSITIVE (A) NEGATIVE Final    Comment: CRITICAL RESULT CALLED TO, READ BACK BY AND VERIFIED WITH: RN T SCEGEN AT 2143 05/07/19 CRUICKSHANK A (NOTE) If result is NEGATIVE SARS-CoV-2 target nucleic acids are NOT DETECTED. The SARS-CoV-2 RNA is generally detectable in upper and lower  respiratory specimens during the acute phase of infection. The lowest  concentration of SARS-CoV-2 viral copies this assay can detect is 250  copies / mL. A negative result does not preclude SARS-CoV-2 infection  and should not be used as the sole basis for treatment or other  patient management decisions.  A negative result may occur with  improper specimen collection / handling, submission of specimen other  than nasopharyngeal swab, presence of viral mutation(s) within the  areas targeted by this assay, and inadequate number of viral copies  (<250 copies / mL). A negative result must be combined with clinical  observations, patient history, and epidemiological information. If result is POSITIVE SARS-CoV-2 target nucleic acids a re DETECTED. The SARS-CoV-2 RNA is generally detectable in upper and lower  respiratory specimens during the acute phase of  infection.  Positive  results are indicative of active infection with SARS-CoV-2.  Clinical  correlation with patient history and other diagnostic information is  necessary to determine patient infection status.  Positive results do  not rule out bacterial infection or co-infection with other viruses. If result is PRESUMPTIVE POSTIVE SARS-CoV-2 nucleic acids MAY BE PRESENT.   A presumptive positive result was obtained on the submitted specimen  and confirmed on repeat testing.  While 2019 novel coronavirus  (SARS-CoV-2) nucleic acids may be present in the submitted sample  additional confirmatory testing may be necessary for epidemiological  and / or clinical management purposes  to differentiate between  SARS-CoV-2 and other Sarbecovirus currently known to infect humans.  If clinically indicated additional testing with an alternate test  methodolo gy [phone]) is advised. The SARS-CoV-2 RNA is generally  detectable in upper and lower respiratory specimens during the acute  phase of infection. The expected result is Negative. Fact Sheet for Patients:  StrictlyIdeas.no Fact Sheet for Healthcare Providers: BankingDealers.co.za This test is not yet approved or cleared by the [place] FDA and has been authorized for detection and/or diagnosis of SARS-CoV-2 by FDA under an Emergency Use Authorization (EUA).  This EUA will remain in effect (meaning this test can be used) for the duration of the COVID-19 declaration under Section 564(b)(1) of the Act, 21 U.S.C. section 360bbb-3(b)(1), unless the authorization is terminated or revoked sooner. Performed at [facility], [place] [address] [place], [place] [address]   Novel Coronavirus, NAA (hospital order; send-out to ref lab)     Status: None   Collection Time: 05/11/19  1:58 PM   Specimen: Nasopharyngeal Swab; Respiratory  Result Value Ref Range Status   SARS-CoV-2, NAA  NOT DETECTED NOT DETECTED Final    Comment: (NOTE) This nucleic acid amplification test was developed and its perfomance characteristics determined by [employer]. Nucleic acid amplification tests include PCR and TMA. This test has not been FDA cleared or approved. This test has been authorized by FDA under an Emergency Use Authorization (EUA). This test is only authorized for the duration of time the declaration that circumstances exist justifying the authorization of the emergency use of in vitro diagnostic tests for detection of SARS-CoV-2 virus and/or diagnosis of COVID-19 infection under section 564(b)(1) of the Act, 21 U.S.C. [id number]) (1), unless the authorization is terminated or revoked sooner. When diagnostic testing is negative, the possibility of a false negative result should be considered in the context of a patient's recent exposures and the presence of clinical signs and symptoms consistent with COVID-19. An individual without symptoms of COVID- 19 and who is not shedding SARS-CoV-2 vir [place] would expect to have a negative (not detected) result in this assay. Performed At: [facility] [place], [place] [id number] Rush Farmer MD [id number]    [place]  Final    Comment: Performed at [facility] [address] [place], [place] [address]  MRSA PCR Screening     Status: Abnormal   Collection Time: 05/13/19 11:29 PM   Specimen:  Nasopharyngeal  Result Value Ref Range Status   MRSA by PCR POSITIVE (A) NEGATIVE Final    Comment:        The GeneXpert MRSA Assay (FDA approved for NASAL specimens only), is one component of a comprehensive MRSA colonization surveillance program. It is not intended to diagnose MRSA infection nor to guide or monitor treatment for MRSA infections. CRITICAL RESULT CALLED TO, READ BACK BY AND VERIFIED WITH: MOSLEY, L. RN @0800  ON 9.2.2020 BY [facility] Performed  at [facility] [address], [place], [place] [address]   Novel Coronavirus, NAA (hospital order; send-out to ref lab)     Status: None   Collection Time: 05/14/19 12:17 PM   Specimen: Nasopharyngeal Swab; Respiratory  Result Value Ref Range Status   SARS-CoV-2, NAA NOT DETECTED NOT DETECTED Final    Comment: (NOTE) This nucleic acid amplification test was developed and its performance characteristics determined by [employer]. Nucleic acid amplification tests include PCR and TMA. This test has not been FDA cleared or approved. This test has been authorized by FDA under an Emergency Use Authorization (EUA). This test is only authorized for the duration of time the declaration that circumstances exist justifying the authorization of the emergency use of in vitro diagnostic tests for detection of SARS-CoV-2 virus and/or diagnosis of COVID-19 infection under section 564(b)(1) of the Act, 21 U.S.C. [id number]) (1), unless the authorization is terminated or revoked sooner. When diagnostic testing is negative, the possibility of a false negative result should be considered in the context of a patient's recent exposures and the presence of clinical signs and symptoms consistent with COVID-19. An individual without symptoms of COVID- 19 and who is not shedding SARS-CoV-2 vi rus would expect to have a negative (not detected) result in this assay. Performed At: [facility] [place], [place] [id number] Rush Farmer MD [id number]    [place]  Final    Comment: Performed at [facility] [address] [place], [place] [address]         Radiology Studies: Dg Chest Port 1 View  Result Date: 05/15/2019 CLINICAL DATA:  Pneumonia EXAM: PORTABLE CHEST 1 VIEW COMPARISON:  05/13/2019 FINDINGS: Multifocal airspace opacities are again noted bilaterally. The heart size remains enlarged. Aortic  calcifications are noted. There are likely small bilateral pleural effusions, right greater than left. There is no pneumothorax. No evidence for an acute osseous abnormality. There are prominent interstitial lung markings bilaterally. IMPRESSION: 1. Stable multifocal airspace opacities bilaterally. 2. Likely small bilateral pleural effusions, right greater than left. 3. Cardiomegaly with findings of volume overload. Electronically Signed   By: Constance Holster M.D.   On: 05/15/2019 22:44        Scheduled Meds: . albuterol  2 puff Inhalation Q6H  . aspirin  81 mg Oral Daily  . atorvastatin  80 mg Oral q1800  . bethanechol  10 mg Oral BID  . bisacodyl  10 mg Rectal Once  . bisacodyl  10 mg Rectal Once  . Chlorhexidine Gluconate Cloth  6 each Topical Daily  . clopidogrel  75 mg Oral Daily  . collagenase   Topical Daily  . feeding supplement (ENSURE ENLIVE)  237 mL Oral TID BM  . folic acid  1 mg Oral Daily  . furosemide  40 mg Intravenous Daily  . furosemide  80 mg Intravenous Once  . gabapentin  100 mg Oral Q8H  . insulin aspart  0-20 Units Subcutaneous TID AC &  HS  . insulin glargine  22 Units Subcutaneous Daily  . LORazepam  1 mg Intravenous Once  . mouth rinse  15 mL Mouth Rinse BID  . metoprolol tartrate  50 mg Oral BID  . mometasone-formoterol  2 puff Inhalation BID  . multivitamin with minerals  1 tablet Oral Daily  . polyethylene glycol  34 g Oral Once  . senna-docusate  3 tablet Oral BID  . sodium chloride flush  10-40 mL Intracatheter Q12H  . tamsulosin  0.8 mg Oral QPC supper   Continuous Infusions: . sodium chloride Stopped (05/05/19 1136)  . ampicillin-sulbactam (UNASYN) IV 3 g (05/17/19 0533)     LOS: 35 days   The patient is critically ill with multiple organ systems failure and requires high complexity decision making for assessment and support, frequent evaluation and titration of therapies, application of advanced monitoring technologies and extensive  interpretation of multiple databases. Critical Care Time devoted to patient care services described in this note  Time spent: 40 minutes     Alyxandra Tenbrink, Geraldo Docker, MD [facility] Pager [phone]  If 7PM-7AM, please contact night-coverage [email or web address] Password [email or web address] 05/17/2019, 9:52 AM

## 2019-05-18 DIAGNOSIS — L8992 Pressure ulcer of unspecified site, stage 2: Secondary | ICD-10-CM | POA: Diagnosis present

## 2019-05-18 DIAGNOSIS — E7849 Other hyperlipidemia: Secondary | ICD-10-CM | POA: Diagnosis not present

## 2019-05-18 DIAGNOSIS — L89132 Pressure ulcer of right lower back, stage 2: Secondary | ICD-10-CM

## 2019-05-18 LAB — CBC
HCT: 25.6 % — ABNORMAL LOW (ref 39.0–52.0)
Hemoglobin: 8.3 g/dL — ABNORMAL LOW (ref 13.0–17.0)
MCH: 30.7 pg (ref 26.0–34.0)
MCHC: 32.4 g/dL (ref 30.0–36.0)
MCV: 94.8 fL (ref 80.0–100.0)
Platelets: 155 10*3/uL (ref 150–400)
RBC: 2.7 MIL/uL — ABNORMAL LOW (ref 4.22–5.81)
RDW: 15.9 % — ABNORMAL HIGH (ref 11.5–15.5)
WBC: 5.9 10*3/uL (ref 4.0–10.5)
nRBC: 0 % (ref 0.0–0.2)

## 2019-05-18 LAB — GLUCOSE, CAPILLARY
Glucose-Capillary: 161 mg/dL — ABNORMAL HIGH (ref 70–99)
Glucose-Capillary: 186 mg/dL — ABNORMAL HIGH (ref 70–99)
Glucose-Capillary: 231 mg/dL — ABNORMAL HIGH (ref 70–99)
Glucose-Capillary: 202 mg/dL — ABNORMAL HIGH (ref 70–99)

## 2019-05-18 LAB — LIPID PANEL
Cholesterol: 83 mg/dL (ref 0–200)
HDL: 28 mg/dL — ABNORMAL LOW (ref >40)
LDL Cholesterol: 44 mg/dL (ref 0–99)
Total CHOL/HDL Ratio: 3 RATIO
Triglycerides: 57 mg/dL (ref <150)
VLDL: 11 mg/dL (ref 0–40)

## 2019-05-18 LAB — COMPREHENSIVE METABOLIC PANEL
ALT: 79 U/L — ABNORMAL HIGH (ref 0–44)
AST: 44 U/L — ABNORMAL HIGH (ref 15–41)
Albumin: 2.1 g/dL — ABNORMAL LOW (ref 3.5–5.0)
Alkaline Phosphatase: 131 U/L — ABNORMAL HIGH (ref 38–126)
Anion gap: 10 (ref 5–15)
BUN: 22 mg/dL (ref 8–23)
CO2: 33 mmol/L — ABNORMAL HIGH (ref 22–32)
Calcium: 8.5 mg/dL — ABNORMAL LOW (ref 8.9–10.3)
Chloride: 95 mmol/L — ABNORMAL LOW (ref 98–111)
Creatinine, Ser: 1.4 mg/dL — ABNORMAL HIGH (ref 0.61–1.24)
GFR calc Af Amer: >60 mL/min (ref >60)
GFR calc non Af Amer: 52 mL/min — ABNORMAL LOW (ref >60)
Glucose, Bld: 125 mg/dL — ABNORMAL HIGH (ref 70–99)
Potassium: 3.9 mmol/L (ref 3.5–5.1)
Sodium: 138 mmol/L (ref 135–145)
Total Bilirubin: 0.2 mg/dL — ABNORMAL LOW (ref 0.3–1.2)
Total Protein: 6.4 g/dL — ABNORMAL LOW (ref 6.5–8.1)

## 2019-05-18 LAB — MAGNESIUM: Magnesium: 1.7 mg/dL (ref 1.7–2.4)

## 2019-05-18 MED ORDER — INSULIN GLARGINE 100 UNIT/ML ~~LOC~~ SOLN
26.0000 [IU] | Freq: Every day | SUBCUTANEOUS | Status: DC
  Administered 2019-05-19: 09:00:00 26 [IU] via SUBCUTANEOUS
  Filled 2019-05-18: qty 0.26

## 2019-05-18 MED ORDER — INSULIN ASPART 100 UNIT/ML ~~LOC~~ SOLN
12.0000 [IU] | Freq: Three times a day (TID) | SUBCUTANEOUS | Status: DC
  Administered 2019-05-18 – 2019-05-19 (×3): 12 [IU] via SUBCUTANEOUS

## 2019-05-18 MED ORDER — MAGNESIUM OXIDE 400 (241.3 MG) MG PO TABS
400.0000 mg | ORAL_TABLET | Freq: Two times a day (BID) | ORAL | Status: AC
  Administered 2019-05-18 – 2019-05-19 (×2): 400 mg via ORAL
  Filled 2019-05-18 (×2): qty 1

## 2019-05-18 NOTE — Plan of Care (Addendum)
Updated patient's daughter [name], all questions answered.   14:15 patient set up in chair with lift.   16:00 patient assisted back to bed with lift.

## 2019-05-18 NOTE — Progress Notes (Addendum)
PROGRESS NOTE    Robert [name]  [id number] DOB: [birth year] DOA: 04/11/2019 PCP: Redmond School, MD   Brief Narrative:  68 year old WM PMHx COPD, CAD, diabetes mellitus, ischemic cardiomyopathy, RA,   Presented to the hospital and was admitted on 04/11/2019 with shortness of breath, he was found to be significantly hypoxic, tested positive for COVID-19, was intubated and transferred to [facility] for further management.  Hospital course complicated by persistent encephalopathy and difficulty weaning due to tachypnea and agitation, he failed extubation once on 8/2.  Hospital course was also complicated by MRSA pneumonia with septic shock at the beginning of August status post completed treatment. Patient eventually was eventually successfully extubated on 8/22 and transferred to the floor on 8/24   Subjective: 9/6 A/O x4, negative CP, positive S OB, negative abdominal pain, positive weakness.  Patient very motivated to start physical therapy   Assessment & Plan:   Principal Problem:   Pneumonia due to COVID-19 virus Active Problems:   Rheumatoid arthritis ([place])   Hyperlipidemia   COPD (chronic obstructive pulmonary disease) (HCC)   Controlled type 2 diabetes mellitus with diabetic peripheral angiopathy without gangrene, with long-term current use of insulin (HCC)   Essential hypertension   Chronic combined systolic and diastolic CHF (congestive heart failure) (HCC)   Ischemic cardiomyopathy   Coronary artery disease   Dyslipidemia (high LDL; low HDL)   Acute respiratory failure with hypoxemia (HCC)   COVID-19 virus detected   Acute respiratory distress syndrome (ARDS) due to COVID-19 virus   Atrial fibrillation with RVR (HCC)   COPD (chronic obstructive pulmonary disease) with emphysema (HCC)   AKI (acute kidney injury) ([place])   Diabetes mellitus type 2, uncontrolled, with complications (HCC)   Pressure injury of skin   Staphylococcal pneumonia ([place])   HCAP (healthcare-associated  pneumonia)   Aspiration pneumonia of both lower lobes due to gastric secretions (HCC)   MRSA pneumonia (HCC)   Severe sepsis with septic shock (HCC)   NSTEMI (non-ST elevated myocardial infarction) ([place])   Stage II decubitus ulcer ([place])   Acute Hypoxic Respiratory Failure with hypoxia/ Covid-19 pneumonia/ COPD exacerbation -Patient continues to smoke 1.5 PPD until admission to the hospital - Titrate O2 to maintain SPO2 89 to 93% - Currently on 8 L/min O2, SPO2 92% - Completed course of Remdesivir - Completed course of Decadron/Solu-Medrol - Completed Actemra 8/1 - Flutter valve - Albuterol QID - PT/OT consult pending -Ambulate patient every shift -We will need to obtain ambulatory SPO2 was prior to discharge  Aspiration pneumonia -9/1 PCXR shows continued aspiration pneumonia see below -Complete 5-day course of Unasyn -Speech evaluated patient dysphagia 1 diet with honey thick liquid.  MRSA /septic shock -Off pressors - Completed course antibiotic  NSTEMI/CAD - Hx CAD -Known chronic total occlusion of RCA, 60% stenosis proximal LAD and 60% stenosis ostial first diagonal artery. 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 -Patient's troponin troponin trending up significantly [id number], -Medical management per cardiology -Strict in and out -22.5 L -Daily weight Filed Weights   05/06/19 0500 05/07/19 0235  Weight: 111 kg 106.1 kg  - 9/5 Lasix 40 mg daily ( Hold creatinine trending up) - Hydralazine PRN - Metoprolol 50 mg BID  Acute on chronic systolic and diastolic CHF - See NSTEMI  Paroxysmal atrial fib with RVR -Currently NSR - See NSTEMI  Diabetes type 2 uncontrolled with complication (neuropathy, vascular disease)  - 7/31 hemoglobin A1c= 7.4 -9/6  Increase Lantus 26 units -Resistant SSI -9/6 increase Novolog 12 units qac  HLD -Lipid panel within AHA/ADA guidelines -LDL<70 - Continue Lipitor 80 mg daily;  Deconditioning/ICU induced muscle  wasting -9/3 PT OT consult; recommend CIR  Hypernatremia -Resolved  Hypokalemia - Potassium goal> 4  Hypomagnesmia - Magnesium goal>2 -Magnesium oxide 400 mg BID  Acute kidney injury (baseline Cr 0.80) Lab Results  Component Value Date   CREATININE 1.40 (H) 05/18/2019   CREATININE 1.52 (H) 05/17/2019   CREATININE 1.34 (H) 05/16/2019  - Not yet back to baseline see NSTEMI  Acute  urinary retention - Patient has had multiple voiding trial failures.  Last one was 9/2 overnight. - Patient with in and out cath x2 overnight bloody with blood clots. - Heparin has been DC'd, reinsert Foley if urine does not clear will require urology consult - Continue Flomax 0.8 mg daily  ETT clotting/bleeding from subclavian line -ET tube clotted exchanged on 8/12 -Resolved s/p discontinuation of heparin on 9/3  Thrombocytopenia - Improving continue to monitor: Stable  RA -He is on Actemra and methotrexate at home, currently on hold  Pressure ulcer of medial sacrum bilateral buttocks stage II -Wound care service consulted, will follow recommendations Pressure Injury 04/17/19 Sacrum Medial Deep Tissue Injury - Purple or maroon localized area of discolored intact skin or blood-filled blister due to damage of underlying soft tissue from pressure and/or shear. dark purple with some skin sloughing on lowe (Active)  04/17/19 0730  Location: Sacrum  Location Orientation: Medial  Staging: Deep Tissue Injury - Purple or maroon localized area of discolored intact skin or blood-filled blister due to damage of underlying soft tissue from pressure and/or shear.  Wound Description (Comments): dark purple with some skin sloughing on lower sacral area  Present on Admission: No     Pressure Injury 04/17/19 Buttocks Right;Lower Unstageable - Full thickness tissue loss in which the base of the ulcer is covered by slough (yellow, tan, gray, green or brown) and/or eschar (tan, brown or black) in the wound bed.  (Active)  04/17/19 0730  Location: Buttocks  Location Orientation: Right;Lower  Staging: Unstageable - Full thickness tissue loss in which the base of the ulcer is covered by slough (yellow, tan, gray, green or brown) and/or eschar (tan, brown or black) in the wound bed.  Wound Description (Comments):   Present on Admission: No     Pressure Injury 04/17/19 Buttocks Left;Lower WOC updated to Stage 3 on 05/06/19 at the time of assessment (Active)  04/17/19 0730  Location: Buttocks  Location Orientation: Left;Lower  Staging:   Wound Description (Comments): WOC updated to Stage 3 on 05/06/19 at the time of assessment  Present on Admission: No    Goals of care - 9/4 PT/OT consult.  Patient with ischemic cardiomyopathy, s/p COVID pneumonia evaluate for SNF vs CIR.  Work with patient daily.      DVT prophylaxis: SCD Code Status: Full Family Communication: 9/6 spoke with Dortha Kern (daughter) counseled her on plan of care.  Answered all questions  Disposition Plan: TBD   Consultants:  PCCM     Procedures/Significant Events:  8/1 admission to [facility] ICU 8/2 extubated 8/3 early AM agitated, placed on BIPAP, required re-intubation 8/4 started on steroids for COPD exacerbation 8/8 septic shock requiring pressor support 8/9 off pressors, respiratory cultures +s.aureus 8/10 persistent encephalopathy off sedation, CT head ordered 8/11 encephalopathy improved 8/12 difficulty weaning due to tachypnea, ETT exchanged due to clot burden 8/13 PICC line inserted and subclavian central line removed 8/14 Antibiotics coverage broaden due to persistent fever and resp distress,  pan cultured  8/19 more alert, tolerating pressure support 8/20 failed voiding trial, Foley needed to be reinserted 8/22 successfully extubated  8/24 transfer to the floor 9/1  transferred to ICU given worsening respiratory failure, increased work of breathing 9/1 PCXR:-Opacification of the lower right hemithorax  may represent combination of pleural fluid and airspace disease, pneumonia vs aspiration. -Increased left pleural effusion since prior exam.  -Progressive interstitial opacities from prior likely combination of pulmonary edema and atypical pneumonia. 9/2 echocardiogram: LVEF = 50-55%.-mildly dilated. moderate basal septal hypertrophy.  -Left ventricular diastolic Doppler parameters are consistent with restrictive filling.  Mitral valve regurgitation: mild -moderate .   I have personally reviewed and interpreted all radiology studies and my findings are as above.  VENTILATOR SETTINGS: Nasal cannula 1 L/min SPO2 90%     Cultures 7/31 blood negative final 7/31 RIGHT arm negative 7/31 urine negative 8/1 tracheal aspirate respiratory flora 8/7 blood right AC negative 8/7 blood RIGHT hand negative 8/7 tracheal aspirate positive MRSA 8/8 urine negative 8/14 blood LEFT hand negative 8/14 blood RIGHT hand negative 8/15 tracheal aspirate positive MRSA 9/1 MRSA by PCR positive      Antimicrobials: Anti-infectives (From admission, onward)   Start     Stop   05/14/19 0430  Ampicillin-Sulbactam (UNASYN) 3 g in sodium chloride 0.9 % 100 mL IVPB     05/18/19 2359   04/30/19 2200  vancomycin (VANCOCIN) 1,500 mg in sodium chloride 0.9 % 500 mL IVPB     05/03/19 1900   04/29/19 0900  vancomycin (VANCOCIN) 1,500 mg in sodium chloride 0.9 % 500 mL IVPB  Status:  Discontinued     04/29/19 2324   04/27/19 1800  ceFEPIme (MAXIPIME) 2 g in sodium chloride 0.9 % 100 mL IVPB  Status:  Discontinued     04/29/19 1316   04/26/19 1630  vancomycin (VANCOCIN) 1,750 mg in sodium chloride 0.9 % 500 mL IVPB  Status:  Discontinued     04/29/19 0847   04/25/19 1400  vancomycin (VANCOCIN) IVPB 1000 mg/200 mL premix     04/25/19 1456   04/25/19 1300  ceFEPIme (MAXIPIME) 2 g in sodium chloride 0.9 % 100 mL IVPB  Status:  Discontinued     04/27/19 1319   04/23/19 1921  vancomycin variable dose per unstable  renal function (pharmacist dosing)  Status:  Discontinued     04/25/19 1252   04/22/19 2200  dextrose 5 % 250 mL with vancomycin (VANCOCIN) 1,250 mg infusion  Status:  Discontinued     04/23/19 1921   04/20/19 1400  vancomycin (VANCOCIN) 1,250 mg in sodium chloride 0.9 % 250 mL IVPB  Status:  Discontinued     04/20/19 1215   04/20/19 1400  vancomycin (VANCOCIN) 1,250 mg in sodium chloride 0.9 % 250 mL IVPB  Status:  Discontinued     04/20/19 1220   04/20/19 1400  Vancomycin HCl in Dextrose 1.25-5 GM/250ML-% SOLN 1,250 mg  Status:  Discontinued     04/20/19 1221   04/20/19 1400  Vancomycin 1250mg  in D5W  Status:  Discontinued     04/20/19 1233   04/20/19 1400  Vancomycin 1250mg  in D5W  Status:  Discontinued     04/22/19 1324   04/19/19 2200  piperacillin-tazobactam (ZOSYN) IVPB 3.375 g  Status:  Discontinued     04/21/19 1533   04/19/19 1430  vancomycin (VANCOCIN) 2,000 mg in sodium chloride 0.9 % 500 mL IVPB     04/19/19 1806   04/19/19 1400  piperacillin-tazobactam (  ZOSYN) IVPB 3.375 g     04/19/19 1530   04/19/19 1000  azithromycin (ZITHROMAX) tablet 250 mg  Status:  Discontinued     04/20/19 1655   04/18/19 1300  azithromycin (ZITHROMAX) tablet 500 mg     04/18/19 1351   04/13/19 1000  remdesivir 100 mg in sodium chloride 0.9 % 250 mL IVPB     04/16/19 1103   04/12/19 1000  remdesivir 200 mg in sodium chloride 0.9 % 250 mL IVPB     04/12/19 1317   04/12/19 0600  ceFEPIme (MAXIPIME) 2 g in sodium chloride 0.9 % 100 mL IVPB  Status:  Discontinued     04/15/19 1452   04/12/19 0200  vancomycin (VANCOCIN) IVPB 750 mg/150 ml premix  Status:  Discontinued     04/12/19 1455   04/11/19 2130  ceFEPIme (MAXIPIME) 2 g in sodium chloride 0.9 % 100 mL IVPB     04/11/19 2257   04/11/19 2130  metroNIDAZOLE (FLAGYL) IVPB 500 mg     04/11/19 2356   04/11/19 2130  vancomycin (VANCOCIN) IVPB 1000 mg/200 mL premix     04/12/19 0105       Devices    LINES / TUBES:      Continuous  Infusions: . sodium chloride Stopped (05/05/19 1136)  . ampicillin-sulbactam (UNASYN) IV 3 g (05/18/19 1244)     Objective: Vitals:   05/18/19 0000 05/18/19 0545 05/18/19 0825 05/18/19 1156  BP: 113/65 105/63 134/76 (!) 144/78  Pulse: 83 87 86 90  Resp: 20 18 20  (!) 25  Temp: 98.9 F (37.2 C) 99.4 F (37.4 C) 98.6 F (37 C) 98.6 F (37 C)  TempSrc: Oral Oral Oral Oral  SpO2: (!) 87% 92% 93% 95%  Weight:      Height:        Intake/Output Summary (Last 24 hours) at 05/18/2019 1711 Last data filed at 05/18/2019 1500 Gross per 24 hour  Intake 1960 ml  Output 1600 ml  Net 360 ml   Filed Weights   05/06/19 0500 05/07/19 0235  Weight: 111 kg 106.1 kg   Physical Exam:  General: A/O x4, positive acute respiratory distress Eyes: negative scleral hemorrhage, negative anisocoria, negative icterus ENT: Negative Runny nose, negative gingival bleeding, Neck:  Negative scars, masses, torticollis, lymphadenopathy, JVD Lungs: Clear to auscultation bilaterally without wheezes or crackles Cardiovascular: Regular rate and rhythm without murmur gallop or rub normal S1 and S2 Abdomen: negative abdominal pain, nondistended, positive soft, bowel sounds, no rebound, no ascites, no appreciable mass Extremities: No significant cyanosis, clubbing, or edema bilateral lower extremities Skin: Bilateral buttock stage II ulcer healing well Psychiatric:  Negative depression, negative anxiety, negative fatigue, negative mania Central nervous system:  Cranial nerves II through XII intact, tongue/uvula midline, all extremities muscle strength 5/5, sensation intact throughout, negative dysarthria, negative expressive aphasia, negative receptive aphasia.      Data Reviewed: Care during the described time interval was provided by me .  I have reviewed this patient's available data, including medical history, events of note, physical examination, and all test results as part of my evaluation.   CBC: Recent  Labs  Lab 05/13/19 2220  05/14/19 2340 05/15/19 0610 05/16/19 0139 05/17/19 0114 05/18/19 0112  WBC 10.2   < > 7.1 6.5 6.0 5.9 5.9  NEUTROABS 7.4  --   --   --   --   --   --   HGB 10.7*   < > 8.9* 8.5* 8.0* 8.3* 8.3*  HCT 34.1*   < > 27.8* 26.5* 25.1* 26.2* 25.6*  MCV 95.8   < > 94.2 93.6 94.7 93.9 94.8  PLT 175   < > 132* 147* 149* 159 155   < > = values in this interval not displayed.   Basic Metabolic Panel: Recent Labs  Lab 05/13/19 2220  05/14/19 0801 05/15/19 0610 05/16/19 0139 05/17/19 0114 05/18/19 0112  NA 138   < > 137 139 137 137 138  K 4.6   < > 4.3 4.1 3.5 4.3 3.9  CL 99  --  97* 100 97* 95* 95*  CO2 29  --  31 30 30  32 33*  GLUCOSE 356*  --  154* 196* 191* 175* 125*  BUN 24*  --  26* 24* 23 25* 22  CREATININE 1.36*  --  1.30* 1.31* 1.34* 1.52* 1.40*  CALCIUM 9.3  --  9.3 8.9 8.6* 8.5* 8.5*  MG 2.0  --   --   --  1.7 1.7 1.7   < > = values in this interval not displayed.   GFR: Estimated Creatinine Clearance: 65.3 mL/min (A) (by C-G formula based on SCr of 1.4 mg/dL (H)). Liver Function Tests: Recent Labs  Lab 05/14/19 0801 05/15/19 0610 05/16/19 0139 05/17/19 0114 05/18/19 0112  AST 31 26 31  37 44*  ALT 64* 54* 58* 68* 79*  ALKPHOS 156* 140* 132* 144* 131*  BILITOT 0.1* 0.4 0.2* 0.5 0.2*  PROT 6.6 6.4* 6.3* 6.6 6.4*  ALBUMIN 2.4* 2.2* 2.0* 2.2* 2.1*   No results for input(s): LIPASE, AMYLASE in the last 168 hours. No results for input(s): AMMONIA in the last 168 hours. Coagulation Profile: No results for input(s): INR, PROTIME in the last 168 hours. Cardiac Enzymes: No results for input(s): CKTOTAL, CKMB, CKMBINDEX, TROPONINI in the last 168 hours. BNP (last 3 results) No results for input(s): PROBNP in the last 8760 hours. HbA1C: No results for input(s): HGBA1C in the last 72 hours. CBG: Recent Labs  Lab 05/17/19 1210 05/17/19 1628 05/17/19 2110 05/18/19 0849 05/18/19 1154  GLUCAP 286* 328* 221* 161* 186*   Lipid Profile:  Recent Labs    05/18/19 0112  CHOL 83  HDL 28*  LDLCALC 44  TRIG 57  CHOLHDL 3.0   Thyroid Function Tests: No results for input(s): TSH, T4TOTAL, FREET4, T3FREE, THYROIDAB in the last 72 hours. Anemia Panel: No results for input(s): VITAMINB12, FOLATE, FERRITIN, TIBC, IRON, RETICCTPCT in the last 72 hours. Urine analysis:    Component Value Date/Time   COLORURINE YELLOW 04/25/2019 0826   APPEARANCEUR HAZY (A) 04/25/2019 0826   LABSPEC 1.015 04/25/2019 0826   PHURINE 5.0 04/25/2019 0826   GLUCOSEU 150 (A) 04/25/2019 0826   HGBUR LARGE (A) 04/25/2019 0826   BILIRUBINUR NEGATIVE 04/25/2019 0826   KETONESUR NEGATIVE 04/25/2019 0826   PROTEINUR 30 (A) 04/25/2019 0826   UROBILINOGEN 0.2 12/22/2011 2130   NITRITE NEGATIVE 04/25/2019 0826   LEUKOCYTESUR NEGATIVE 04/25/2019 0826   Sepsis Labs: @LABRCNTIP (procalcitonin:4,lacticidven:4)  ) Recent Results (from the past 240 hour(s))  Novel Coronavirus, NAA (hospital order; send-out to ref lab)     Status: None   Collection Time: 05/11/19  1:58 PM   Specimen: Nasopharyngeal Swab; Respiratory  Result Value Ref Range Status   SARS-CoV-2, NAA NOT DETECTED NOT DETECTED Final    Comment: (NOTE) This nucleic acid amplification test was developed and its perfomance characteristics determined by [employer]. Nucleic acid amplification tests include PCR and TMA. This test has not been FDA  cleared or approved. This test has been authorized by FDA under an Emergency Use Authorization (EUA). This test is only authorized for the duration of time the declaration that circumstances exist justifying the authorization of the emergency use of in vitro diagnostic tests for detection of SARS-CoV-2 virus and/or diagnosis of COVID-19 infection under section 564(b)(1) of the Act, 21 U.S.C. [id number]) (1), unless the authorization is terminated or revoked sooner. When diagnostic testing is negative, the possibility of a false negative  result should be considered in the context of a patient's recent exposures and the presence of clinical signs and symptoms consistent with COVID-19. An individual without symptoms of COVID- 19 and who is not shedding SARS-CoV-2 vir [place] would expect to have a negative (not detected) result in this assay. Performed At: [facility] [place], [place] [id number] Rush Farmer MD [id number]    [place]  Final    Comment: Performed at [facility] [address], [place], [place] [address]  MRSA PCR Screening     Status: Abnormal   Collection Time: 05/13/19 11:29 PM   Specimen: Nasopharyngeal  Result Value Ref Range Status   MRSA by PCR POSITIVE (A) NEGATIVE Final    Comment:        The GeneXpert MRSA Assay (FDA approved for NASAL specimens only), is one component of a comprehensive MRSA colonization surveillance program. It is not intended to diagnose MRSA infection nor to guide or monitor treatment for MRSA infections. CRITICAL RESULT CALLED TO, READ BACK BY AND VERIFIED WITH: MOSLEY, L. RN @0800  ON 9.2.2020 BY [facility] Performed at [facility], [place] [address] [place], [place] [address]   Novel Coronavirus, NAA (hospital order; send-out to ref lab)     Status: None   Collection Time: 05/14/19 12:17 PM   Specimen: Nasopharyngeal Swab; Respiratory  Result Value Ref Range Status   SARS-CoV-2, NAA NOT DETECTED NOT DETECTED Final    Comment: (NOTE) This nucleic acid amplification test was developed and its performance characteristics determined by [employer]. Nucleic acid amplification tests include PCR and TMA. This test has not been FDA cleared or approved. This test has been authorized by FDA under an Emergency Use Authorization (EUA). This test is only authorized for the duration of time the declaration that circumstances exist justifying the authorization of the emergency use of  in vitro diagnostic tests for detection of SARS-CoV-2 virus and/or diagnosis of COVID-19 infection under section 564(b)(1) of the Act, 21 U.S.C. [id number]) (1), unless the authorization is terminated or revoked sooner. When diagnostic testing is negative, the possibility of a false negative result should be considered in the context of a patient's recent exposures and the presence of clinical signs and symptoms consistent with COVID-19. An individual without symptoms of COVID- 19 and who is not shedding SARS-CoV-2 vi rus would expect to have a negative (not detected) result in this assay. Performed At: [facility] [place], [place] [id number] Rush Farmer MD [id number]    [place]  Final    Comment: Performed at [facility] [address] [place], [place] [address]         Radiology Studies: No results found.      Scheduled Meds: . albuterol  2 puff Inhalation Q6H  . aspirin  81 mg Oral Daily  . atorvastatin  80 mg Oral q1800  . bethanechol  10 mg Oral BID  . bisacodyl  10 mg Rectal Once  .  bisacodyl  10 mg Rectal Once  . Chlorhexidine Gluconate Cloth  6 each Topical Daily  . clopidogrel  75 mg Oral Daily  . collagenase   Topical Daily  . feeding supplement (ENSURE ENLIVE)  237 mL Oral TID BM  . folic acid  1 mg Oral Daily  . furosemide  80 mg Intravenous Once  . gabapentin  100 mg Oral Q8H  . insulin aspart  0-20 Units Subcutaneous TID AC & HS  . insulin aspart  12 Units Subcutaneous TID WC  . [START ON 05/19/2019] insulin glargine  26 Units Subcutaneous Daily  . LORazepam  1 mg Intravenous Once  . magnesium oxide  400 mg Oral BID  . mouth rinse  15 mL Mouth Rinse BID  . metoprolol tartrate  50 mg Oral BID  . mometasone-formoterol  2 puff Inhalation BID  . multivitamin with minerals  1 tablet Oral Daily  . polyethylene glycol  34 g Oral Once  . senna-docusate  3 tablet Oral BID  . sodium  chloride flush  10-40 mL Intracatheter Q12H  . tamsulosin  0.8 mg Oral QPC supper   Continuous Infusions: . sodium chloride Stopped (05/05/19 1136)  . ampicillin-sulbactam (UNASYN) IV 3 g (05/18/19 1244)     LOS: 36 days   The patient is critically ill with multiple organ systems failure and requires high complexity decision making for assessment and support, frequent evaluation and titration of therapies, application of advanced monitoring technologies and extensive interpretation of multiple databases. Critical Care Time devoted to patient care services described in this note  Time spent: 40 minutes     Odella Appelhans, Geraldo Docker, MD [facility] Pager [phone]  If 7PM-7AM, please contact night-coverage [email or web address] Password [email or web address] 05/18/2019, 5:11 PM

## 2019-05-19 ENCOUNTER — Inpatient Hospital Stay (HOSPITAL_COMMUNITY): Payer: Medicare HMO

## 2019-05-19 ENCOUNTER — Other Ambulatory Visit: Payer: Self-pay

## 2019-05-19 DIAGNOSIS — L89302 Pressure ulcer of unspecified buttock, stage 2: Secondary | ICD-10-CM

## 2019-05-19 LAB — COMPREHENSIVE METABOLIC PANEL
ALT: 80 U/L — ABNORMAL HIGH (ref 0–44)
AST: 37 U/L (ref 15–41)
Albumin: 2.5 g/dL — ABNORMAL LOW (ref 3.5–5.0)
Alkaline Phosphatase: 138 U/L — ABNORMAL HIGH (ref 38–126)
Anion gap: 12 (ref 5–15)
BUN: 23 mg/dL (ref 8–23)
CO2: 28 mmol/L (ref 22–32)
Calcium: 9.1 mg/dL (ref 8.9–10.3)
Chloride: 96 mmol/L — ABNORMAL LOW (ref 98–111)
Creatinine, Ser: 1.34 mg/dL — ABNORMAL HIGH (ref 0.61–1.24)
GFR calc Af Amer: >60 mL/min (ref >60)
GFR calc non Af Amer: 55 mL/min — ABNORMAL LOW (ref >60)
Glucose, Bld: 266 mg/dL — ABNORMAL HIGH (ref 70–99)
Potassium: 4.3 mmol/L (ref 3.5–5.1)
Sodium: 136 mmol/L (ref 135–145)
Total Bilirubin: 0.6 mg/dL (ref 0.3–1.2)
Total Protein: 7.3 g/dL (ref 6.5–8.1)

## 2019-05-19 LAB — POCT I-STAT 7, (LYTES, BLD GAS, ICA,H+H)
Acid-Base Excess: 7 mmol/L — ABNORMAL HIGH (ref 0.0–2.0)
Acid-Base Excess: 7 mmol/L — ABNORMAL HIGH (ref 0.0–2.0)
Bicarbonate: 34.8 mmol/L — ABNORMAL HIGH (ref 20.0–28.0)
Bicarbonate: 32.8 mmol/L — ABNORMAL HIGH (ref 20.0–28.0)
Calcium, Ion: 1.3 mmol/L (ref 1.15–1.40)
Calcium, Ion: 1.26 mmol/L (ref 1.15–1.40)
HCT: 30 % — ABNORMAL LOW (ref 39.0–52.0)
HCT: 28 % — ABNORMAL LOW (ref 39.0–52.0)
Hemoglobin: 10.2 g/dL — ABNORMAL LOW (ref 13.0–17.0)
Hemoglobin: 9.5 g/dL — ABNORMAL LOW (ref 13.0–17.0)
O2 Saturation: 99 %
O2 Saturation: 100 %
Patient temperature: 98.6
Patient temperature: 97.7
Potassium: 4.1 mmol/L (ref 3.5–5.1)
Potassium: 4.6 mmol/L (ref 3.5–5.1)
Sodium: 136 mmol/L (ref 135–145)
Sodium: 136 mmol/L (ref 135–145)
TCO2: 37 mmol/L — ABNORMAL HIGH (ref 22–32)
TCO2: 34 mmol/L — ABNORMAL HIGH (ref 22–32)
pCO2 arterial: 65.8 mmHg (ref 32.0–48.0)
pCO2 arterial: 52.4 mmHg — ABNORMAL HIGH (ref 32.0–48.0)
pH, Arterial: 7.331 — ABNORMAL LOW (ref 7.350–7.450)
pH, Arterial: 7.403 (ref 7.350–7.450)
pO2, Arterial: 180 mmHg — ABNORMAL HIGH (ref 83.0–108.0)
pO2, Arterial: 390 mmHg — ABNORMAL HIGH (ref 83.0–108.0)

## 2019-05-19 LAB — TROPONIN I (HIGH SENSITIVITY)
Troponin I (High Sensitivity): 46 ng/L — ABNORMAL HIGH (ref <18)
Troponin I (High Sensitivity): 49 ng/L — ABNORMAL HIGH (ref <18)

## 2019-05-19 LAB — GLUCOSE, CAPILLARY
Glucose-Capillary: 172 mg/dL — ABNORMAL HIGH (ref 70–99)
Glucose-Capillary: 162 mg/dL — ABNORMAL HIGH (ref 70–99)
Glucose-Capillary: 281 mg/dL — ABNORMAL HIGH (ref 70–99)

## 2019-05-19 LAB — PROTIME-INR
INR: 1.2 (ref 0.8–1.2)
Prothrombin Time: 15.4 seconds — ABNORMAL HIGH (ref 11.4–15.2)

## 2019-05-19 LAB — MAGNESIUM
Magnesium: 2 mg/dL (ref 1.7–2.4)
Magnesium: 1.9 mg/dL (ref 1.7–2.4)

## 2019-05-19 LAB — APTT: aPTT: 40 seconds — ABNORMAL HIGH (ref 24–36)

## 2019-05-19 MED ORDER — ALBUTEROL SULFATE (2.5 MG/3ML) 0.083% IN NEBU: 2.5000 mg | INHALATION_SOLUTION | Freq: Four times a day (QID) | RESPIRATORY_TRACT | Status: DC

## 2019-05-19 MED ORDER — MUPIROCIN 2 % EX OINT
TOPICAL_OINTMENT | Freq: Two times a day (BID) | CUTANEOUS | Status: DC
  Administered 2019-05-19 – 2019-05-22 (×6): via NASAL
  Filled 2019-05-19 (×2): qty 22

## 2019-05-19 MED ORDER — MIDAZOLAM HCL 2 MG/2ML IJ SOLN
1.0000 mg | INTRAMUSCULAR | Status: DC | PRN
  Filled 2019-05-19: qty 2

## 2019-05-19 MED ORDER — INSULIN ASPART 100 UNIT/ML ~~LOC~~ SOLN: 15.0000 [IU] | Freq: Three times a day (TID) | SUBCUTANEOUS | Status: DC

## 2019-05-19 MED ORDER — FENTANYL 2500MCG IN NS 250ML (10MCG/ML) PREMIX INFUSION
0.0000 ug/h | INTRAVENOUS | Status: DC
  Administered 2019-05-19: 19:00:00 25 ug/h via INTRAVENOUS
  Administered 2019-05-20: 08:00:00 300 ug/h via INTRAVENOUS
  Filled 2019-05-19 (×2): qty 250

## 2019-05-19 MED ORDER — HEPARIN (PORCINE) 25000 UT/250ML-% IV SOLN
1600.0000 [IU]/h | INTRAVENOUS | Status: DC
  Administered 2019-05-19: 1600 [IU]/h via INTRAVENOUS
  Filled 2019-05-19: qty 250

## 2019-05-19 MED ORDER — FENTANYL CITRATE (PF) 100 MCG/2ML IJ SOLN
INTRAMUSCULAR | Status: AC
  Administered 2019-05-19: 19:00:00 100 ug
  Filled 2019-05-19: qty 2

## 2019-05-19 MED ORDER — DIPHENHYDRAMINE HCL 50 MG/ML IJ SOLN
12.5000 mg | Freq: Four times a day (QID) | INTRAMUSCULAR | Status: DC | PRN
  Administered 2019-05-20: 12.5 mg via INTRAVENOUS
  Filled 2019-05-19: qty 1

## 2019-05-19 MED ORDER — MIDAZOLAM HCL 2 MG/2ML IJ SOLN: 1.0000 mg | INTRAMUSCULAR | Status: DC | PRN

## 2019-05-19 MED ORDER — IPRATROPIUM-ALBUTEROL 0.5-2.5 (3) MG/3ML IN SOLN
3.0000 mL | Freq: Four times a day (QID) | RESPIRATORY_TRACT | Status: DC
  Administered 2019-05-19 – 2019-05-20 (×3): 3 mL via RESPIRATORY_TRACT
  Filled 2019-05-19 (×3): qty 3

## 2019-05-19 MED ORDER — MIDAZOLAM HCL 2 MG/2ML IJ SOLN
INTRAMUSCULAR | Status: AC
  Administered 2019-05-19: 19:00:00 4 mg
  Filled 2019-05-19: qty 4

## 2019-05-19 MED ORDER — COLLAGENASE 250 UNIT/GM EX OINT
TOPICAL_OINTMENT | Freq: Every day | CUTANEOUS | Status: DC
  Administered 2019-05-19: 1 via TOPICAL
  Administered 2019-05-20 – 2019-05-22 (×3): via TOPICAL
  Filled 2019-05-19 (×2): qty 30

## 2019-05-19 MED ORDER — FENTANYL BOLUS VIA INFUSION
25.0000 ug | INTRAVENOUS | Status: DC | PRN
  Administered 2019-05-19 – 2019-05-20 (×3): 25 ug via INTRAVENOUS
  Filled 2019-05-19: qty 25

## 2019-05-19 MED ORDER — ROCURONIUM BROMIDE 10 MG/ML (PF) SYRINGE
PREFILLED_SYRINGE | INTRAVENOUS | Status: AC
  Filled 2019-05-19: qty 10

## 2019-05-19 MED ORDER — INSULIN GLARGINE 100 UNIT/ML ~~LOC~~ SOLN
28.0000 [IU] | Freq: Every day | SUBCUTANEOUS | Status: DC
  Filled 2019-05-19: qty 0.28

## 2019-05-19 MED ORDER — MIDAZOLAM HCL 2 MG/2ML IJ SOLN
1.0000 mg | INTRAMUSCULAR | Status: DC | PRN
  Administered 2019-05-20: 02:00:00 1 mg via INTRAVENOUS

## 2019-05-19 MED ORDER — ETOMIDATE 2 MG/ML IV SOLN
INTRAVENOUS | Status: AC
  Administered 2019-05-19: 19:00:00 20 mg
  Filled 2019-05-19: qty 10

## 2019-05-19 MED ORDER — HEPARIN BOLUS VIA INFUSION
2500.0000 [IU] | Freq: Once | INTRAVENOUS | Status: AC
  Administered 2019-05-19: 21:00:00 2500 [IU] via INTRAVENOUS
  Filled 2019-05-19: qty 2500

## 2019-05-19 MED ORDER — MORPHINE SULFATE (PF) 2 MG/ML IV SOLN
2.0000 mg | Freq: Once | INTRAVENOUS | Status: AC
  Administered 2019-05-19: 17:00:00 2 mg via INTRAVENOUS
  Filled 2019-05-19: qty 1

## 2019-05-19 MED ORDER — MAGNESIUM SULFATE IN D5W 1-5 GM/100ML-% IV SOLN
1.0000 g | Freq: Once | INTRAVENOUS | Status: AC
  Administered 2019-05-19: 21:00:00 1 g via INTRAVENOUS
  Filled 2019-05-19: qty 100

## 2019-05-19 MED ORDER — FENTANYL CITRATE (PF) 100 MCG/2ML IJ SOLN: 25.0000 ug | Freq: Once | INTRAMUSCULAR | Status: DC

## 2019-05-19 NOTE — Progress Notes (Signed)
  Speech Language Pathology Treatment: Dysphagia  Patient Details Name: Robert [name] MRN: [id number] DOB: October 14, 1950 Today's Date: 05/19/2019 Time: [id number] SLP Time Calculation (min) (ACUTE ONLY): 19 min  Assessment / Plan / Recommendation Clinical Impression  Pt upright in bed, has lunch tray but did not eat any of it. RN states he wants staff to feed him, but he states it is because it is nasty. Trialed thin/nectar thick coke; pt took small sips consistently with one verbal cue. No coughing or throat clearing observed. Though pt appears to be improving with swallow function, he still remains largely unsupervised with most meals. Can consider MBS for potential diet upgrade if pt is going to remain at [facility], but diet upgrade would be more appropriate in a rehab setting where therapy and staff can provide increased supervision and safety. If pt is not going to be admitted to CIR in the day or two, then repeat MBS will need to be done here.   HPI HPI: 68 year old male with history of COPD, CAD, diabetes mellitus, ischemic cardiomyopathy, RA, presented to the hospital and was admitted on 04/11/2019 with shortness of breath, he was found to be significantly hypoxic, tested positive for COVID-19, was intubated and transferred to [facility] for further management. Intubated 8/1-8/2, reintubated 8/3-8/22.       SLP Plan  MBS;Continue with current plan of care       Recommendations  Diet recommendations: Honey-thick liquid;Dysphagia 1 (puree) Liquids provided via: Cup;No straw Medication Administration: Whole meds with puree Supervision: Patient able to self feed Compensations: Multiple dry swallows after each bite/sip;Clear throat intermittently Postural Changes and/or Swallow Maneuvers: Seated upright 90 degrees                Plan: MBS;Continue with current plan of care       GO               [name], MA [place] Pager [phone] Office  [phone]  [name] 05/19/2019, 1:58 PM

## 2019-05-19 NOTE — Progress Notes (Addendum)
PROGRESS NOTE    Robert [name]  [id number] DOB: Aug 01, 1951 DOA: 04/11/2019 PCP: Redmond School, MD   Brief Narrative:  68 year old WM PMHx COPD, CAD, diabetes mellitus, ischemic cardiomyopathy, RA,   Presented to the hospital and was admitted on 04/11/2019 with shortness of breath, he was found to be significantly hypoxic, tested positive for COVID-19, was intubated and transferred to [facility] for further management.  Hospital course complicated by persistent encephalopathy and difficulty weaning due to tachypnea and agitation, he failed extubation once on 8/2.  Hospital course was also complicated by MRSA pneumonia with septic shock at the beginning of August status post completed treatment. Patient eventually was eventually successfully extubated on 8/22 and transferred to the floor on 8/24   Subjective: 9/7 A/O x4, negative CP, positive S OB, negative abdominal pain, positive continued weakness.  Continue anxiousness about getting started on his rehab.  At night patient desatting and requiring HFNC 15 L O2.   Assessment & Plan:   Principal Problem:   Pneumonia due to COVID-19 virus Active Problems:   Rheumatoid arthritis ([place])   Hyperlipidemia   COPD (chronic obstructive pulmonary disease) (HCC)   Controlled type 2 diabetes mellitus with diabetic peripheral angiopathy without gangrene, with long-term current use of insulin (HCC)   Essential hypertension   Chronic combined systolic and diastolic CHF (congestive heart failure) (HCC)   Ischemic cardiomyopathy   Coronary artery disease   Dyslipidemia (high LDL; low HDL)   Acute respiratory failure with hypoxemia (HCC)   COVID-19 virus detected   Acute respiratory distress syndrome (ARDS) due to COVID-19 virus   Atrial fibrillation with RVR (HCC)   COPD (chronic obstructive pulmonary disease) with emphysema (HCC)   AKI (acute kidney injury) ([place])   Diabetes mellitus type 2, uncontrolled, with complications (HCC)   Pressure injury  of skin   Staphylococcal pneumonia ([place])   HCAP (healthcare-associated pneumonia)   Aspiration pneumonia of both lower lobes due to gastric secretions (HCC)   MRSA pneumonia (HCC)   Severe sepsis with septic shock (HCC)   NSTEMI (non-ST elevated myocardial infarction) ([place])   Stage II decubitus ulcer ([place])   Acute Hypoxic Respiratory Failure with hypoxia/ Covid-19 pneumonia/ COPD exacerbation -Patient continues to smoke 1.5 PPD until admission to the hospital - Titrate O2 to maintain SPO2 89 to 93% - Currently on 8 L/min O2, SPO2 92% - Completed course of Remdesivir - Completed course of Decadron/Solu-Medrol - Completed Actemra 8/1 - Flutter valve - Albuterol QID -PT/OT recommends CIR. -Ambulate patient every shift -We will need to obtain ambulatory SPO2 was prior to discharge - 9/7 patient desats at night which requires 15 L O2 via HF [place] CIR unable to accommodate this flow rate.  Discussed case with Dr. Lamonte Sakai PCCM concurs that since patient has COVID x2 negative tests try patient on CPAP auto titration.  In addition Claiborne Billings from [place] will check with their administration to ensure that if CPAP works for patient tonight they can accept him in [place] with 2 negative  COVID tests on CPAP  ADDENDUM; just prior to patient leaving an ambulance respiratory status declined abruptly without cause.  Attempts to stabilize were unsuccessful.  Patient transported up to ICU and intubated.  Because of rapid decline appears to be more likely cardiac in nature.  Aspiration pneumonia -9/1 PCXR shows continued aspiration pneumonia see below -Complete 5-day course of Unasyn -Speech evaluated patient dysphagia 1 diet with honey thick liquid.  MRSA /septic shock -Off pressors - Completed course antibiotic  NSTEMI/CAD - Hx CAD -Known chronic total occlusion of RCA, 60% stenosis proximal LAD and 60% stenosis ostial first diagonal artery. 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  -Patient's troponin troponin trending up significantly [phone], -Medical management per cardiology -Strict in and out -23.8 L -Daily weight Filed Weights   05/06/19 0500 05/07/19 0235  Weight: 111 kg 106.1 kg  - 9/5 Lasix 40 mg daily ( Hold creatinine trending up) - Hydralazine PRN - Metoprolol 50 mg BID  Acute on chronic systolic and diastolic CHF - See NSTEMI  Paroxysmal atrial fib with RVR -Currently NSR - See NSTEMI  Diabetes type 2 uncontrolled with complication (neuropathy, vascular disease)  - 7/31 hemoglobin A1c= 7.4 -9/7 increase Lantus 28 units -Resistant SSI -9/7 increase NovoLog 15 unitsqac   HLD -Lipid panel within AHA/ADA guidelines -LDL<70 - Continue Lipitor 80 mg daily;  Deconditioning/ICU induced muscle wasting -9/3 PT OT consult; recommend CIR  Hypernatremia -Resolved  Hypokalemia - Potassium goal> 4  Hypomagnesmia - Magnesium goal>2  Acute kidney injury (baseline Cr 0.80) Lab Results  Component Value Date   CREATININE 1.40 (H) 05/18/2019   CREATININE 1.52 (H) 05/17/2019   CREATININE 1.34 (H) 05/16/2019  - Not yet back to baseline see NSTEMI  Acute  urinary retention - Patient has had multiple voiding trial failures.  Last one was 9/2 overnight. - Patient with in and out cath x2 overnight bloody with blood clots. - Heparin has been DC'd, reinsert Foley if urine does not clear will require urology consult - Continue Flomax 0.8 mg daily  ETT clotting/bleeding from subclavian line -ET tube clotted exchanged on 8/12 -Resolved s/p discontinuation of heparin on 9/3  Thrombocytopenia - Improving continue to monitor: Stable  RA -He is on Actemra and methotrexate at home, currently on hold  Pressure ulcer of medial sacrum bilateral buttocks stage II -Wound care service consulted, will follow recommendations Pressure Injury 04/17/19 Sacrum Medial Deep Tissue Injury - Purple or maroon localized area of discolored intact skin or  blood-filled blister due to damage of underlying soft tissue from pressure and/or shear. dark purple with some skin sloughing on lowe (Active)  04/17/19 0730  Location: Sacrum  Location Orientation: Medial  Staging: Deep Tissue Injury - Purple or maroon localized area of discolored intact skin or blood-filled blister due to damage of underlying soft tissue from pressure and/or shear.  Wound Description (Comments): dark purple with some skin sloughing on lower sacral area  Present on Admission: No     Pressure Injury 04/17/19 Buttocks Right;Lower Unstageable - Full thickness tissue loss in which the base of the ulcer is covered by slough (yellow, tan, gray, green or brown) and/or eschar (tan, brown or black) in the wound bed. (Active)  04/17/19 0730  Location: Buttocks  Location Orientation: Right;Lower  Staging: Unstageable - Full thickness tissue loss in which the base of the ulcer is covered by slough (yellow, tan, gray, green or brown) and/or eschar (tan, brown or black) in the wound bed.  Wound Description (Comments):   Present on Admission: No     Pressure Injury 04/17/19 Buttocks Left;Lower WOC updated to Stage 3 on 05/06/19 at the time of assessment (Active)  04/17/19 0730  Location: Buttocks  Location Orientation: Left;Lower  Staging:   Wound Description (Comments): WOC updated to Stage 3 on 05/06/19 at the time of assessment  Present on Admission: No    Goals of care - 9/4 PT/OT consult.  Patient with ischemic cardiomyopathy, s/p COVID pneumonia evaluate for SNF vs CIR.  Work with patient daily.      DVT prophylaxis: SCD Code Status: Full Family Communication: 9/7 spoke with Dortha Kern (daughter) counseled her on plan of care.  Answered all questions  Disposition Plan: TBD   Consultants:  PCCM     Procedures/Significant Events:  8/1 admission to [facility] ICU 8/2 extubated 8/3 early AM agitated, placed on BIPAP, required re-intubation 8/4 started on steroids for  COPD exacerbation 8/8 septic shock requiring pressor support 8/9 off pressors, respiratory cultures +s.aureus 8/10 persistent encephalopathy off sedation, CT head ordered 8/11 encephalopathy improved 8/12 difficulty weaning due to tachypnea, ETT exchanged due to clot burden 8/13 PICC line inserted and subclavian central line removed 8/14 Antibiotics coverage broaden due to persistent fever and resp distress, pan cultured  8/19 more alert, tolerating pressure support 8/20 failed voiding trial, Foley needed to be reinserted 8/22 successfully extubated  8/24 transfer to the floor 9/1  transferred to ICU given worsening respiratory failure, increased work of breathing 9/1 PCXR:-Opacification of the lower right hemithorax may represent combination of pleural fluid and airspace disease, pneumonia vs aspiration. -Increased left pleural effusion since prior exam.  -Progressive interstitial opacities from prior likely combination of pulmonary edema and atypical pneumonia. 9/2 echocardiogram: LVEF = 50-55%.-mildly dilated. moderate basal septal hypertrophy.  -Left ventricular diastolic Doppler parameters are consistent with restrictive filling.  Mitral valve regurgitation: mild -moderate .   I have personally reviewed and interpreted all radiology studies and my findings are as above.  VENTILATOR SETTINGS: 9/7 HFNC Flow rate 4 L/min SPO2 90%     Cultures 7/31 blood negative final 7/31 RIGHT arm negative 7/31 urine negative 8/1 tracheal aspirate respiratory flora 8/7 blood right AC negative 8/7 blood RIGHT hand negative 8/7 tracheal aspirate positive MRSA 8/8 urine negative 8/14 blood LEFT hand negative 8/14 blood RIGHT hand negative 8/15 tracheal aspirate positive MRSA 9/1 MRSA by PCR positive      Antimicrobials: Anti-infectives (From admission, onward)   Start     Stop   05/14/19 0430  Ampicillin-Sulbactam (UNASYN) 3 g in sodium chloride 0.9 % 100 mL IVPB     05/18/19  2359   04/30/19 2200  vancomycin (VANCOCIN) 1,500 mg in sodium chloride 0.9 % 500 mL IVPB     05/03/19 1900   04/29/19 0900  vancomycin (VANCOCIN) 1,500 mg in sodium chloride 0.9 % 500 mL IVPB  Status:  Discontinued     04/29/19 2324   04/27/19 1800  ceFEPIme (MAXIPIME) 2 g in sodium chloride 0.9 % 100 mL IVPB  Status:  Discontinued     04/29/19 1316   04/26/19 1630  vancomycin (VANCOCIN) 1,750 mg in sodium chloride 0.9 % 500 mL IVPB  Status:  Discontinued     04/29/19 0847   04/25/19 1400  vancomycin (VANCOCIN) IVPB 1000 mg/200 mL premix     04/25/19 1456   04/25/19 1300  ceFEPIme (MAXIPIME) 2 g in sodium chloride 0.9 % 100 mL IVPB  Status:  Discontinued     04/27/19 1319   04/23/19 1921  vancomycin variable dose per unstable renal function (pharmacist dosing)  Status:  Discontinued     04/25/19 1252   04/22/19 2200  dextrose 5 % 250 mL with vancomycin (VANCOCIN) 1,250 mg infusion  Status:  Discontinued     04/23/19 1921   04/20/19 1400  vancomycin (VANCOCIN) 1,250 mg in sodium chloride 0.9 % 250 mL IVPB  Status:  Discontinued     04/20/19 1215   04/20/19 1400  vancomycin (VANCOCIN) 1,250 mg  in sodium chloride 0.9 % 250 mL IVPB  Status:  Discontinued     04/20/19 1220   04/20/19 1400  Vancomycin HCl in Dextrose 1.25-5 GM/250ML-% SOLN 1,250 mg  Status:  Discontinued     04/20/19 1221   04/20/19 1400  Vancomycin 1250mg  in D5W  Status:  Discontinued     04/20/19 1233   04/20/19 1400  Vancomycin 1250mg  in D5W  Status:  Discontinued     04/22/19 1324   04/19/19 2200  piperacillin-tazobactam (ZOSYN) IVPB 3.375 g  Status:  Discontinued     04/21/19 1533   04/19/19 1430  vancomycin (VANCOCIN) 2,000 mg in sodium chloride 0.9 % 500 mL IVPB     04/19/19 1806   04/19/19 1400  piperacillin-tazobactam (ZOSYN) IVPB 3.375 g     04/19/19 1530   04/19/19 1000  azithromycin (ZITHROMAX) tablet 250 mg  Status:  Discontinued     04/20/19 1655   04/18/19 1300  azithromycin (ZITHROMAX) tablet 500 mg      04/18/19 1351   04/13/19 1000  remdesivir 100 mg in sodium chloride 0.9 % 250 mL IVPB     04/16/19 1103   04/12/19 1000  remdesivir 200 mg in sodium chloride 0.9 % 250 mL IVPB     04/12/19 1317   04/12/19 0600  ceFEPIme (MAXIPIME) 2 g in sodium chloride 0.9 % 100 mL IVPB  Status:  Discontinued     04/15/19 1452   04/12/19 0200  vancomycin (VANCOCIN) IVPB 750 mg/150 ml premix  Status:  Discontinued     04/12/19 1455   04/11/19 2130  ceFEPIme (MAXIPIME) 2 g in sodium chloride 0.9 % 100 mL IVPB     04/11/19 2257   04/11/19 2130  metroNIDAZOLE (FLAGYL) IVPB 500 mg     04/11/19 2356   04/11/19 2130  vancomycin (VANCOCIN) IVPB 1000 mg/200 mL premix     04/12/19 0105       Devices    LINES / TUBES:      Continuous Infusions: . sodium chloride Stopped (05/05/19 1136)     Objective: Vitals:   05/19/19 0700 05/19/19 0739 05/19/19 0800 05/19/19 0900  BP:  (!) 144/87 (!) 143/82 136/70  Pulse: 99 94 97 (!) 101  Resp: (!) 22 18 19  (!) 23  Temp:  98 F (36.7 C)    TempSrc:  Oral    SpO2: 100% 100% 99% (!) 87%  Weight:      Height:        Intake/Output Summary (Last 24 hours) at 05/19/2019 0942 Last data filed at 05/19/2019 0432 Gross per 24 hour  Intake 1060 ml  Output 1900 ml  Net -840 ml   Filed Weights   05/06/19 0500 05/07/19 0235  Weight: 111 kg 106.1 kg   Physical Exam:  General: A/O x4, positive  acute respiratory distress Eyes: negative scleral hemorrhage, negative anisocoria, negative icterus ENT: Negative Runny nose, negative gingival bleeding, Neck:  Negative scars, masses, torticollis, lymphadenopathy, JVD Lungs: Clear to auscultation bilaterally without wheezes or crackles Cardiovascular: Regular rate and rhythm without murmur gallop or rub normal S1 and S2 Abdomen: negative abdominal pain, nondistended, positive soft, bowel sounds, no rebound, no ascites, no appreciable mass Extremities: No significant cyanosis, clubbing, or edema bilateral lower  extremities Skin: Bilateral buttock stage II ulcer healing well Psychiatric:  Negative depression, negative anxiety, negative fatigue, negative mania  Central nervous system:  Cranial nerves II through XII intact, tongue/uvula midline, all extremities muscle strength 5/5, sensation intact throughout,  negative dysarthria, negative expressive aphasia, negative receptive aphasia.       Data Reviewed: Care during the described time interval was provided by me .  I have reviewed this patient's available data, including medical history, events of note, physical examination, and all test results as part of my evaluation.   CBC: Recent Labs  Lab 05/13/19 2220  05/14/19 2340 05/15/19 0610 05/16/19 0139 05/17/19 0114 05/18/19 0112  WBC 10.2   < > 7.1 6.5 6.0 5.9 5.9  NEUTROABS 7.4  --   --   --   --   --   --   HGB 10.7*   < > 8.9* 8.5* 8.0* 8.3* 8.3*  HCT 34.1*   < > 27.8* 26.5* 25.1* 26.2* 25.6*  MCV 95.8   < > 94.2 93.6 94.7 93.9 94.8  PLT 175   < > 132* 147* 149* 159 155   < > = values in this interval not displayed.   Basic Metabolic Panel: Recent Labs  Lab 05/13/19 2220  05/14/19 0801 05/15/19 0610 05/16/19 0139 05/17/19 0114 05/18/19 0112 05/19/19 0405  NA 138   < > 137 139 137 137 138  --   K 4.6   < > 4.3 4.1 3.5 4.3 3.9  --   CL 99  --  97* 100 97* 95* 95*  --   CO2 29  --  31 30 30  32 33*  --   GLUCOSE 356*  --  154* 196* 191* 175* 125*  --   BUN 24*  --  26* 24* 23 25* 22  --   CREATININE 1.36*  --  1.30* 1.31* 1.34* 1.52* 1.40*  --   CALCIUM 9.3  --  9.3 8.9 8.6* 8.5* 8.5*  --   MG 2.0  --   --   --  1.7 1.7 1.7 2.0   < > = values in this interval not displayed.   GFR: Estimated Creatinine Clearance: 65.3 mL/min (A) (by C-G formula based on SCr of 1.4 mg/dL (H)). Liver Function Tests: Recent Labs  Lab 05/14/19 0801 05/15/19 0610 05/16/19 0139 05/17/19 0114 05/18/19 0112  AST 31 26 31  37 44*  ALT 64* 54* 58* 68* 79*  ALKPHOS 156* 140* 132* 144* 131*   BILITOT 0.1* 0.4 0.2* 0.5 0.2*  PROT 6.6 6.4* 6.3* 6.6 6.4*  ALBUMIN 2.4* 2.2* 2.0* 2.2* 2.1*   No results for input(s): LIPASE, AMYLASE in the last 168 hours. No results for input(s): AMMONIA in the last 168 hours. Coagulation Profile: No results for input(s): INR, PROTIME in the last 168 hours. Cardiac Enzymes: No results for input(s): CKTOTAL, CKMB, CKMBINDEX, TROPONINI in the last 168 hours. BNP (last 3 results) No results for input(s): PROBNP in the last 8760 hours. HbA1C: No results for input(s): HGBA1C in the last 72 hours. CBG: Recent Labs  Lab 05/18/19 0849 05/18/19 1154 05/18/19 1716 05/18/19 2025 05/19/19 0815  GLUCAP 161* 186* 231* 202* 172*   Lipid Profile: Recent Labs    05/18/19 0112  CHOL 83  HDL 28*  LDLCALC 44  TRIG 57  CHOLHDL 3.0   Thyroid Function Tests: No results for input(s): TSH, T4TOTAL, FREET4, T3FREE, THYROIDAB in the last 72 hours. Anemia Panel: No results for input(s): VITAMINB12, FOLATE, FERRITIN, TIBC, IRON, RETICCTPCT in the last 72 hours. Urine analysis:    Component Value Date/Time   COLORURINE YELLOW 04/25/2019 0826   APPEARANCEUR HAZY (A) 04/25/2019 0826   LABSPEC 1.015 04/25/2019 0826   PHURINE 5.0 04/25/2019  [id number]   GLUCOSEU 150 (A) 04/25/2019 0826   HGBUR LARGE (A) 04/25/2019 0826   BILIRUBINUR NEGATIVE 04/25/2019 0826   KETONESUR NEGATIVE 04/25/2019 0826   PROTEINUR 30 (A) 04/25/2019 0826   UROBILINOGEN 0.2 12/22/2011 2130   NITRITE NEGATIVE 04/25/2019 0826   LEUKOCYTESUR NEGATIVE 04/25/2019 0826   Sepsis Labs: @LABRCNTIP (procalcitonin:4,lacticidven:4)  ) Recent Results (from the past 240 hour(s))  Novel Coronavirus, NAA (hospital order; send-out to ref lab)     Status: None   Collection Time: 05/11/19  1:58 PM   Specimen: Nasopharyngeal Swab; Respiratory  Result Value Ref Range Status   SARS-CoV-2, NAA NOT DETECTED NOT DETECTED Final    Comment: (NOTE) This nucleic acid amplification test was developed and its  perfomance characteristics determined by [employer]. Nucleic acid amplification tests include PCR and TMA. This test has not been FDA cleared or approved. This test has been authorized by FDA under an Emergency Use Authorization (EUA). This test is only authorized for the duration of time the declaration that circumstances exist justifying the authorization of the emergency use of in vitro diagnostic tests for detection of SARS-CoV-2 virus and/or diagnosis of COVID-19 infection under section 564(b)(1) of the Act, 21 U.S.C. [id number]) (1), unless the authorization is terminated or revoked sooner. When diagnostic testing is negative, the possibility of a false negative result should be considered in the context of a patient's recent exposures and the presence of clinical signs and symptoms consistent with COVID-19. An individual without symptoms of COVID- 19 and who is not shedding SARS-CoV-2 vir [place] would expect to have a negative (not detected) result in this assay. Performed At: [facility] [place], [place] [id number] Rush Farmer MD [id number]    [place]  Final    Comment: Performed at [facility] [address], [place], [place] [address]  MRSA PCR Screening     Status: Abnormal   Collection Time: 05/13/19 11:29 PM   Specimen: Nasopharyngeal  Result Value Ref Range Status   MRSA by PCR POSITIVE (A) NEGATIVE Final    Comment:        The GeneXpert MRSA Assay (FDA approved for NASAL specimens only), is one component of a comprehensive MRSA colonization surveillance program. It is not intended to diagnose MRSA infection nor to guide or monitor treatment for MRSA infections. CRITICAL RESULT CALLED TO, READ BACK BY AND VERIFIED WITH: MOSLEY, L. RN @0800  ON 9.2.2020 BY [facility] Performed at [facility], [place] [address] [place], [place] [address]   Novel Coronavirus, NAA  (hospital order; send-out to ref lab)     Status: None   Collection Time: 05/14/19 12:17 PM   Specimen: Nasopharyngeal Swab; Respiratory  Result Value Ref Range Status   SARS-CoV-2, NAA NOT DETECTED NOT DETECTED Final    Comment: (NOTE) This nucleic acid amplification test was developed and its performance characteristics determined by [employer]. Nucleic acid amplification tests include PCR and TMA. This test has not been FDA cleared or approved. This test has been authorized by FDA under an Emergency Use Authorization (EUA). This test is only authorized for the duration of time the declaration that circumstances exist justifying the authorization of the emergency use of in vitro diagnostic tests for detection of SARS-CoV-2 virus and/or diagnosis of COVID-19 infection under section 564(b)(1) of the Act, 21 U.S.C. [id number]) (1), unless the authorization is terminated or revoked sooner. When diagnostic testing is negative, the possibility of a false negative result should be considered in  the context of a patient's recent exposures and the presence of clinical signs and symptoms consistent with COVID-19. An individual without symptoms of COVID- 19 and who is not shedding SARS-CoV-2 vi rus would expect to have a negative (not detected) result in this assay. Performed At: [facility] [place], [place] [id number] Rush Farmer MD [id number]    [place]  Final    Comment: Performed at [facility] [address] [place], [place] [address]         Radiology Studies: No results found.      Scheduled Meds: . albuterol  2 puff Inhalation Q6H  . aspirin  81 mg Oral Daily  . atorvastatin  80 mg Oral q1800  . bethanechol  10 mg Oral BID  . bisacodyl  10 mg Rectal Once  . bisacodyl  10 mg Rectal Once  . Chlorhexidine Gluconate Cloth  6 each Topical Daily  . clopidogrel  75 mg Oral Daily  .  collagenase   Topical Daily  . feeding supplement (ENSURE ENLIVE)  237 mL Oral TID BM  . folic acid  1 mg Oral Daily  . furosemide  80 mg Intravenous Once  . gabapentin  100 mg Oral Q8H  . insulin aspart  0-20 Units Subcutaneous TID AC & HS  . insulin aspart  12 Units Subcutaneous TID WC  . insulin glargine  26 Units Subcutaneous Daily  . LORazepam  1 mg Intravenous Once  . mouth rinse  15 mL Mouth Rinse BID  . metoprolol tartrate  50 mg Oral BID  . mometasone-formoterol  2 puff Inhalation BID  . multivitamin with minerals  1 tablet Oral Daily  . polyethylene glycol  34 g Oral Once  . senna-docusate  3 tablet Oral BID  . sodium chloride flush  10-40 mL Intracatheter Q12H  . tamsulosin  0.8 mg Oral QPC supper   Continuous Infusions: . sodium chloride Stopped (05/05/19 1136)     LOS: 37 days   The patient is critically ill with multiple organ systems failure and requires high complexity decision making for assessment and support, frequent evaluation and titration of therapies, application of advanced monitoring technologies and extensive interpretation of multiple databases. Critical Care Time devoted to patient care services described in this note  Time spent: 40 minutes     WOODS, Geraldo Docker, MD [facility] Pager [phone]  If 7PM-7AM, please contact night-coverage [email or web address] Password [facility] 05/19/2019, 9:42 AM

## 2019-05-19 NOTE — Consult Note (Signed)
NAME:  Robert [name], MRN:  [id number], DOB:  07-30-1951, LOS: 83 ADMISSION DATE:  04/11/2019, CONSULTATION DATE:  8/1 REFERRING MD:  Roderic Palau, CHIEF COMPLAINT:  dyspnea   Brief History   68 year old male admitted on 8/1 in the setting of ARDS from COVID-19 pneumonia. Initially presented to [facility] on 7/31 and required intubation. Post-procedure in the ED, patient awakened and self-extubated and required re-intubation. At [facility], tolerated PS and subsequently extubated on 8/2 however required intubation within 24 hours. Hospital course complicated by septic shock 2/2 MRSA pneumonia and volume overload.   Back to ICU on September 1 after vomiting, aspirating, having an elevated troponin. Improved and weaned to 6L/min [place]  Was stable until afternoon of 9/7 when he was noted to become diaphoretic, cool, clammy.  He complained of dyspnea and had associated hypoxemia requiring escalation to 1.00 NRB.  He received hydralazine for associated hypertension, also single dose morphine for his dyspnea.  Progressed to respiratory distress and then respiratory failure.  Able to deny chest pain, notes dyspnea.  Moved to the ICU for further care.    Past Medical History  Anxiety COPD GERD CAD Hypertension Hyperlipidemia Rheumatoid arthritis History of systolic heart failure, however 08/2018 Echo showed LVEF 50-55%, left atrium   Significant Hospital Events   8/1 admission to [facility] ICU 8/2 extubated 8/3 early AM agitated, placed on BIPAP, required re-intubation 8/4 started on steroids for COPD exacerbation 8/8 septic shock requiring pressor support 8/9 off pressors, respiratory cultures +s.aureus 8/10 persistent encephalopathy off sedation, CT head ordered 8/11 encephalopathy improved 8/12 difficulty weaning due to tachypnea, ETT exchanged due to dried blood clot burden 8/13 Bleeding fromsubclavian CVL. Exchanged for PICC line 8/17 started weaning on PSV 14/5 8/22 extubated  9/1 back to ICU after  aspiration event, elevated troponin  9/7 back to ICU with hypoxemic respiratory failure, intubated    Consults:  PCCM  Procedures:  8/1 ETT >8/2, 8/3 >8/22 8/1 L subclavian CVL >8/13 8/13 Rt PICC ETT 9/7 >>   Significant Diagnostic Tests:  08/2018 Echo> LVEF 50-55%, left atrium dilated  CT head 8/11-mild age-related atrophy and chronic microvascular ischemic changes. Old lacunar infarct.  Micro Data:  7/31 blood >NGTD 7/31 sars-cov-2 >positive 8/1 Resp CX > Normal flora 8/7 Resp CX >MRSA 8/7 BCX > NGTD 8/8 UCX > NGTD 8/15 Resp culture>  MRSA   Antimicrobials:  Cefepime 7/31 >8/4  Flagyl 7/31  Remdesivir 8/1 >8/5  Actemra 8/1  Decadron/solumedrol 8/1 >8/8  Zosyn 8/8>8/9 Azithro 8/7>8/9  Cefepime 8/14 >off Vanc 7/31, 8/8>8/22  Unasyn 9/1 > completed  Interim history/subjective:  Evolving respiratory distress and then hypoxemic failure as above.  He does state that he is dyspneic, denies any chest pain.  No report that he was eating/drinking (has a history of aspiration events)  Objective   Blood pressure (!) 196/110, pulse (!) 118, temperature 98 F (36.7 C), temperature source Oral, resp. rate (!) 35, height 6' (1.829 m), weight 106.1 kg, SpO2 98 %.        Intake/Output Summary (Last 24 hours) at 05/19/2019 1831 Last data filed at 05/19/2019 0432 Gross per 24 hour  Intake 240 ml  Output 1500 ml  Net -1260 ml   Filed Weights   05/06/19 0500 05/07/19 0235  Weight: 111 kg 106.1 kg    Examination:  General: Ill-appearing man, diaphoretic, market respiratory distress HENT: Oropharynx clear, moist, no significant upper airway secretions PULM: Diffuse rhonchi bilaterally without wheezes CV: Regular, distant, no murmur,  chronic venous stasis changes bilateral lower extremities GI: Soft, nondistended, positive bowel sounds MSK: No deformities Neuro: Awake, able to nod to questions, unable to speak due to dyspnea, does follow commands.    Resolved Hospital Problem list     Assessment & Plan:   Recurrent acute hypoxemic respiratory failure.  Clinical history suspicious for cardiac cause.  No reported aspiration event.  His COVID testing has been negative x2 (in preparation for transfer to [facility]) so doubt residual or recurrent pneumonitis due to viral process -Intubate now -Plan ventilation with PRVC 8 cc/kg pending chest x-ray to assess for degree of airspace disease, consider ARDS protocol -Could consider empiric antibiotics although no clear evidence for a pneumonia at this point -ECG, serial troponin ordered and pending.  Would favor empiric heparin infusion for possible acute MI, cardiac decompensation -N.p.o.  Presumed CAD, demand ischemia documented previously in this hospitalization -Heparin infusion as above -ASA, Plavix as ordered -Lipitor as ordered -ECG, serial troponins -He will need a cardiac evaluation once he is stable to undergo.  Should be able to do so now that he is COVID negative  COPD: -Change Dulera to scheduled DuoNeb for now   COVID: Negative x2, last was 9/2 -No real indication to repeat at this time  Hyperglycemia -Lantus 28 units once daily -Meal coverage 15 units -Sliding-scale protocol   Best practice:  PPI Initiating heparin infusion 9/7  Labs   CBC: Recent Labs  Lab 05/13/19 2220  05/14/19 2340 05/15/19 0610 05/16/19 0139 05/17/19 0114 05/18/19 0112 05/19/19 1745  WBC 10.2   < > 7.1 6.5 6.0 5.9 5.9  --   NEUTROABS 7.4  --   --   --   --   --   --   --   HGB 10.7*   < > 8.9* 8.5* 8.0* 8.3* 8.3* 10.2*  HCT 34.1*   < > 27.8* 26.5* 25.1* 26.2* 25.6* 30.0*  MCV 95.8   < > 94.2 93.6 94.7 93.9 94.8  --   PLT 175   < > 132* 147* 149* 159 155  --    < > = values in this interval not displayed.    Basic Metabolic Panel: Recent Labs  Lab 05/13/19 2220  05/14/19 0801 05/15/19 0610 05/16/19 0139 05/17/19 0114 05/18/19 0112 05/19/19 0405 05/19/19 1745  NA 138   < > 137  139 137 137 138  --  136  K 4.6   < > 4.3 4.1 3.5 4.3 3.9  --  4.1  CL 99  --  97* 100 97* 95* 95*  --   --   CO2 29  --  31 30 30  32 33*  --   --   GLUCOSE 356*  --  154* 196* 191* 175* 125*  --   --   BUN 24*  --  26* 24* 23 25* 22  --   --   CREATININE 1.36*  --  1.30* 1.31* 1.34* 1.52* 1.40*  --   --   CALCIUM 9.3  --  9.3 8.9 8.6* 8.5* 8.5*  --   --   MG 2.0  --   --   --  1.7 1.7 1.7 2.0  --    < > = values in this interval not displayed.   GFR: Estimated Creatinine Clearance: 65.3 mL/min (A) (by C-G formula based on SCr of 1.4 mg/dL (H)). Recent Labs  Lab 05/13/19 2220 05/14/19 0801  05/15/19 0610 05/16/19 0139 05/17/19 0114 05/18/19 0112  PROCALCITON  --  0.49  --  0.70 0.53  --   --   WBC 10.2 6.7   < > 6.5 6.0 5.9 5.9  LATICACIDVEN 1.5  --   --   --   --   --   --    < > = values in this interval not displayed.    Liver Function Tests: Recent Labs  Lab 05/14/19 0801 05/15/19 0610 05/16/19 0139 05/17/19 0114 05/18/19 0112  AST 31 26 31  37 44*  ALT 64* 54* 58* 68* 79*  ALKPHOS 156* 140* 132* 144* 131*  BILITOT 0.1* 0.4 0.2* 0.5 0.2*  PROT 6.6 6.4* 6.3* 6.6 6.4*  ALBUMIN 2.4* 2.2* 2.0* 2.2* 2.1*   No results for input(s): LIPASE, AMYLASE in the last 168 hours. No results for input(s): AMMONIA in the last 168 hours.  ABG    Component Value Date/Time   PHART 7.331 (L) 05/19/2019 1745   PCO2ART 65.8 (HH) 05/19/2019 1745   PO2ART 180.0 (H) 05/19/2019 1745   HCO3 34.8 (H) 05/19/2019 1745   TCO2 37 (H) 05/19/2019 1745   ACIDBASEDEF 1.0 04/24/2019 0424   O2SAT 99.0 05/19/2019 1745     Coagulation Profile: No results for input(s): INR, PROTIME in the last 168 hours.  Cardiac Enzymes: No results for input(s): CKTOTAL, CKMB, CKMBINDEX, TROPONINI in the last 168 hours.  HbA1C: Hgb A1c MFr Bld  Date/Time Value Ref Range Status  04/11/2019 09:50 PM 7.4 (H) 4.8 - 5.6 % Final    Comment:    (NOTE) Pre diabetes:          5.7%-6.4% Diabetes:               >6.4% Glycemic control for   <7.0% adults with diabetes   06/07/2017 12:02 AM 9.1 (H) 4.8 - 5.6 % Final    Comment:    (NOTE) Pre diabetes:          5.7%-6.4% Diabetes:              >6.4% Glycemic control for   <7.0% adults with diabetes     CBG: Recent Labs  Lab 05/18/19 1154 05/18/19 1716 05/18/19 2025 05/19/19 0815 05/19/19 1118  GLUCAP 186* 231* 202* 172* 162*      Review of Systems:   Patient unable to give much history.  Clearly dyspneic, with respiratory distress.  He is able to deny any chest pain either currently or leading up to this event.  Past Medical History  He,  has a past medical history of Anxiety, COPD (chronic obstructive pulmonary disease) ([place]), Coronary artery disease, Full dentures, GERD (gastroesophageal reflux disease), High cholesterol, History of MI (myocardial infarction), Hypertension, Incarcerated umbilical hernia (0000000), Inguinal hernia (05/2016), Insulin dependent diabetes mellitus ([place]), Ischemic cardiomyopathy, Left leg cellulitis (10/08/2016), and Rheumatoid arthritis(714.0).   Surgical History    Past Surgical History:  Procedure Laterality Date  . CARDIAC CATHETERIZATION N/A 02/01/2015   Procedure: Left Heart Cath and Coronary Angiography;  Surgeon: Belva Crome, MD;  Location: [place] CV LAB;  Service: Cardiovascular;  Laterality: N/A;  . CARDIAC CATHETERIZATION N/A 03/22/2015   Procedure: Left Heart Cath and Coronary Angiography;  Surgeon: Leonie Man, MD;  Location: [place] [facility];  Service: Cardiovascular;  Laterality: N/A;  . CARDIAC CATHETERIZATION  09/05/2002; 03/09/2003; 04/10/2005;06/02/2005; 05/09/2006; 02/09/2007; 03/01/2007  . COLONOSCOPY  2008   Dr. Gala Romney: diverticulosis, hyperplastic polyp.  . CORONARY ANGIOPLASTY  11/14/2012; 02/01/2015  . CORONARY ANGIOPLASTY WITH STENT PLACEMENT  05/16/2012   "1; makes total ~  4"  . CORONARY STENT INTERVENTION N/A 06/06/2017   Procedure: CORONARY STENT INTERVENTION;  Surgeon:  Belva Crome, MD;  Location: [place] [facility];  Service: Cardiovascular;  Laterality: N/A;  . INSERTION OF MESH Bilateral 05/24/2016   Procedure: INSERTION OF MESH;  Surgeon: Coralie Keens, MD;  Location: [place];  Service: General;  Laterality: Bilateral;  . LAPAROSCOPIC CHOLECYSTECTOMY    . LAPAROSCOPIC INGUINAL HERNIA WITH UMBILICAL HERNIA Bilateral 05/24/2016   Procedure: BILATERAL LAPAROSCOPIC INGUINAL HERNIA REPAIR WITH MESH AND UMBILICAL HERNIA REPAIR WITH MESH;  Surgeon: Coralie Keens, MD;  Location: [place];  Service: General;  Laterality: Bilateral;  . LEFT HEART CATH AND CORONARY ANGIOGRAPHY N/A 06/06/2017   Procedure: LEFT HEART CATH AND CORONARY ANGIOGRAPHY;  Surgeon: Belva Crome, MD;  Location: [place] [facility];  Service: Cardiovascular;  Laterality: N/A;  . LEFT HEART CATHETERIZATION WITH CORONARY ANGIOGRAM N/A 12/22/2011   Procedure: LEFT HEART CATHETERIZATION WITH CORONARY ANGIOGRAM;  Surgeon: Troy Sine, MD;  Location: [facility] CATH LAB;  Service: Cardiovascular;  Laterality: N/A;  . LEFT HEART CATHETERIZATION WITH CORONARY ANGIOGRAM Bilateral 05/16/2012   Procedure: LEFT HEART CATHETERIZATION WITH CORONARY ANGIOGRAM;  Surgeon: Lorretta Harp, MD;  Location: [facility] CATH LAB;  Service: Cardiovascular;  Laterality: Bilateral;  . LEFT HEART CATHETERIZATION WITH CORONARY ANGIOGRAM N/A 11/14/2012   Procedure: LEFT HEART CATHETERIZATION WITH CORONARY ANGIOGRAM;  Surgeon: Troy Sine, MD;  Location: [facility] CATH LAB;  Service: Cardiovascular;  Laterality: N/A;  . LEFT HEART CATHETERIZATION WITH CORONARY ANGIOGRAM N/A 09/01/2013   Procedure: LEFT HEART CATHETERIZATION WITH CORONARY ANGIOGRAM;  Surgeon: Leonie Man, MD;  Location: [facility] CATH LAB;  Service: Cardiovascular;  Laterality: N/A;  . [place]; 1973; 1985   x 3  . NM MYOCAR PERF WALL MOTION  08/30/2009   No significant ischemia  . OPEN REDUCTION INTERNAL FIXATION (ORIF) DISTAL  RADIAL FRACTURE Right 12/10/2016   Procedure: OPEN REDUCTION INTERNAL FIXATION (ORIF) DISTAL RADIAL FRACTURE;  Surgeon: Iran Planas, MD;  Location: [place];  Service: Orthopedics;  Laterality: Right;  . PERCUTANEOUS CORONARY INTERVENTION-BALLOON ONLY  12/22/2011   Procedure: PERCUTANEOUS CORONARY INTERVENTION-BALLOON ONLY;  Surgeon: Troy Sine, MD;  Location: [facility] CATH LAB;  Service: Cardiovascular;;  . PERCUTANEOUS CORONARY INTERVENTION-BALLOON ONLY  11/14/2012   Procedure: PERCUTANEOUS CORONARY INTERVENTION-BALLOON ONLY;  Surgeon: Troy Sine, MD;  Location: [facility] CATH LAB;  Service: Cardiovascular;;  . PERCUTANEOUS CORONARY STENT INTERVENTION (PCI-S)  05/16/2012   Procedure: PERCUTANEOUS CORONARY STENT INTERVENTION (PCI-S);  Surgeon: Lorretta Harp, MD;  Location: [facility] CATH LAB;  Service: Cardiovascular;;  . PERCUTANEOUS CORONARY STENT INTERVENTION (PCI-S)  09/01/2013   Procedure: PERCUTANEOUS CORONARY STENT INTERVENTION (PCI-S);  Surgeon: Leonie Man, MD;  Location: [facility] CATH LAB;  Service: Cardiovascular;;     Social History   reports that he has been smoking cigarettes. He has been smoking about 1.00 pack per day. He has never used smokeless tobacco. He reports that he does not drink alcohol or use drugs.   Family History   His family history includes Colon cancer in his brother and mother; Heart disease in his father and mother; Hypertension in his mother; Lung cancer in his mother.   Allergies Allergies  Allergen Reactions  . Ivp Dye [Iodinated Diagnostic Agents] Itching and Rash     Home Medications  Prior to Admission medications   Medication Sig Start Date End Date Taking? Authorizing Provider  acarbose (PRECOSE) 100 MG tablet Take 100 mg  by mouth 3 (three) times daily with meals.  03/08/17  Yes [provider]  acetaminophen (TYLENOL) 650 MG CR tablet Take 650 mg by mouth every 8 (eight) hours as needed for pain.   Yes [provider]  ALPRAZolam Duanne Moron) 0.5 MG  tablet Take 0.5 mg by mouth 3 (three) times daily as needed for anxiety.    Yes [provider]  AMBULATORY NON FORMULARY MEDICATION Take 600 mg by mouth daily at 12 noon. Medication Name: Dalcetrapib vs placebo Patient taking differently: Take 600 mg by mouth every evening. Medication Name: Dalcetrapib vs placebo: Cholesterol 12/15/16  Yes Jettie Booze, MD  aspirin EC 81 MG EC tablet Take 1 tablet (81 mg total) by mouth daily. 11/15/12  Yes Brett Canales, PA-C  atorvastatin (LIPITOR) 80 MG tablet TAKE 1 TABLET DAILY AT 6 PM. Patient taking differently: Take 80 mg by mouth daily at 6 PM.  10/30/18  Yes Croitoru, Mihai, MD  clopidogrel (PLAVIX) 75 MG tablet TAKE (1) TABLET BY MOUTH ONCE DAILY. 02/17/19  Yes Croitoru, Mihai, MD  clotrimazole-betamethasone (LOTRISONE) cream Apply 1 application topically 2 (two) times daily.   Yes [provider]  fish oil-omega-3 fatty acids 1000 MG capsule Take 1 g by mouth daily.    Yes [provider]  folic acid (FOLVITE) 1 MG tablet Take 1 mg by mouth daily.    Yes [provider]  furosemide (LASIX) 40 MG tablet Take 80mg  po bid for 5 days then 80mg  po daily Patient taking differently: Take 80 mg by mouth daily.  02/17/19  Yes Croitoru, Mihai, MD  gabapentin (NEURONTIN) 300 MG capsule Take 300 mg by mouth 3 (three) times daily as needed (for pain).  03/10/17  Yes [provider]  glipiZIDE (GLUCOTROL) 10 MG tablet Take 10 mg by mouth 2 (two) times daily. 02/12/17  Yes [provider]  Insulin Glargine (TOUJEO SOLOSTAR [place]) Inject 80 Units into the skin daily.   Yes [provider]  isosorbide mononitrate (IMDUR) 60 MG 24 hr tablet Take 1 tablet (60 mg total) by mouth daily. 03/25/19  Yes Croitoru, Mihai, MD  losartan (COZAAR) 25 MG tablet Take 0.5 tablets (12.5 mg total) by mouth daily. 06/12/17  Yes Bhagat, Bhavinkumar, PA  metFORMIN (GLUCOPHAGE) 1000 MG tablet Take 1 tablet (1,000 mg total) by mouth 2  (two) times daily with a meal. Patient taking differently: Take 1,000-1,250 mg by mouth See admin instructions. Take 1500 mg by mouth in the morning and 1000 mg in the evening 03/23/15  Yes Bhagat, Bhavinkumar, PA  methotrexate 50 MG/2ML injection Inject 2 mLs into the skin once a week. Takes every Thursday 12/01/17  Yes [provider]  metoprolol tartrate (LOPRESSOR) 25 MG tablet TAKE 1 TABLET TWICE DAILY. KEEP JULY APPOINTMENT. Patient taking differently: Take 12.5 mg by mouth 2 (two) times daily.  02/24/19  Yes Croitoru, Mihai, MD  nitroGLYCERIN (NITROSTAT) 0.4 MG SL tablet Place 1 tablet (0.4 mg total) under the tongue every 5 (five) minutes x 3 doses as needed for chest pain. 04/24/16  Yes Croitoru, Mihai, MD  potassium chloride SA (K-DUR) 20 MEQ tablet 1 TABLET BY MOUTH DAILY. 03/13/19  Yes Croitoru, Mihai, MD  predniSONE (DELTASONE) 5 MG tablet Take 5 mg by mouth as directed. Filled on 04-10-19 # 48 dose pak ...   Yes [provider]  primidone (MYSOLINE) 50 MG tablet Take 100 mg by mouth daily.    Yes [provider]  Tocilizumab 162 MG/0.9ML  SOSY Inject 162 mg into the skin every Monday. ACTEMRA   Yes [provider]     Critical care time: 56 min      Baltazar Apo, MD, PhD 05/19/2019, 6:54 PM [facility] [phone] or if no answer [phone]

## 2019-05-19 NOTE — Progress Notes (Signed)
Entered pt room and pt c/o of being hot. Noted pt O2 sats dropping into 80's and increased pt O2 to 10L. Pt temp 98.6 and CBG 206. Pt stating he still can't breathe and using accessory muscles to breathe. Nonrebreather placed on pt at 15L. Pt O2 sats recovered to 100% Placed ice packs around pt to help him cool down and obtained vitals. BP 203/105, MAP 131. Paged Dr Sherral Hammers and administered prn hydralazine. Administered morphine per Dr. Sherral Hammers and pt noted to be very diaphoretic. Rapid response notified.

## 2019-05-19 NOTE — Progress Notes (Signed)
eLink Physician-Brief Progress Note Patient Name: Robert [name] DOB: Jun 18, 1951 MRN: [id number]   Date of Service  05/19/2019  HPI/Events of Note  AFIB with controlled ventricular response. HR + 92-95. BP = 109/71. K+ = 4.3, Mg++ = 1.9 and Creatinine = 1.34. Patient currently on Metoprolol PO.  eICU Interventions  Will order: 1. Replace Mg++. 2. Continue present management.      Intervention Category Major Interventions: Arrhythmia - evaluation and management  [name] 05/19/2019, 8:45 PM

## 2019-05-19 NOTE — Progress Notes (Signed)
Inpatient Rehabilitation-Admissions Coordinator   Was notified by Dr. Sherral Hammers of pt's readiness for rehab. Noted that pt's O2 demand is up to 15L at nighttime. With current nighttime demand, CIR is not an appropriate setting. Will continue to follow to see how pt does tonight.   [name], OTR/L  Rehab Admissions Coordinator  [phone] 05/19/2019 1:39 PM

## 2019-05-19 NOTE — Progress Notes (Signed)
ANTICOAGULATION CONSULT NOTE - Initial Consult  Pharmacy Consult for Heparin Indication: chest pain/ACS  Allergies  Allergen Reactions  . Ivp Dye [Iodinated Diagnostic Agents] Itching and Rash    Patient Measurements: Height: 6' (182.9 cm) Weight: (No scale on the bed) IBW/kg (Calculated) : 77.6 Heparin Dosing Weight: 99 kg   Vital Signs: Temp: 98 F (36.7 C) (09/07 0739) Temp Source: Oral (09/07 0739) BP: 196/110 (09/07 1716) Pulse Rate: 103 (09/07 1821)  Labs: Recent Labs    05/17/19 0114 05/18/19 0112 05/19/19 1745  HGB 8.3* 8.3* 10.2*  HCT 26.2* 25.6* 30.0*  PLT 159 155  --   CREATININE 1.52* 1.40*  --     Estimated Creatinine Clearance: 65.3 mL/min (A) (by C-G formula based on SCr of 1.4 mg/dL (H)).   Medical History: Past Medical History:  Diagnosis Date  . Anxiety   . COPD (chronic obstructive pulmonary disease) ([place])   . Coronary artery disease   . Full dentures   . GERD (gastroesophageal reflux disease)    Rolaids as needed  . High cholesterol   . History of MI (myocardial infarction)   . Hypertension    med. dosage increased 04/2016; has been on BP med. x 10 yrs.  . Incarcerated umbilical hernia 0000000  . Inguinal hernia 05/2016   bilateral   . Insulin dependent diabetes mellitus ([place])   . Ischemic cardiomyopathy    a. 03/2015 EF 40-45% by LV gram.  . Left leg cellulitis 10/08/2016  . Rheumatoid arthritis(714.0)     Medications:  Medications Prior to Admission  Medication Sig Dispense Refill Last Dose  . acarbose (PRECOSE) 100 MG tablet Take 100 mg by mouth 3 (three) times daily with meals.    unknown  . acetaminophen (TYLENOL) 650 MG CR tablet Take 650 mg by mouth every 8 (eight) hours as needed for pain.   unknown  . ALPRAZolam (XANAX) 0.5 MG tablet Take 0.5 mg by mouth 3 (three) times daily as needed for anxiety.    unknown  . AMBULATORY NON FORMULARY MEDICATION Take 600 mg by mouth daily at 12 noon. Medication Name: Dalcetrapib vs placebo  (Patient taking differently: Take 600 mg by mouth every evening. Medication Name: Dalcetrapib vs placebo: Cholesterol)   unknown  . aspirin EC 81 MG EC tablet Take 1 tablet (81 mg total) by mouth daily.   unknown  . atorvastatin (LIPITOR) 80 MG tablet TAKE 1 TABLET DAILY AT 6 PM. (Patient taking differently: Take 80 mg by mouth daily at 6 PM. ) 90 tablet 3 unknown  . clopidogrel (PLAVIX) 75 MG tablet TAKE (1) TABLET BY MOUTH ONCE DAILY. 90 tablet 0 unknown  . clotrimazole-betamethasone (LOTRISONE) cream Apply 1 application topically 2 (two) times daily.   unknown  . fish oil-omega-3 fatty acids 1000 MG capsule Take 1 g by mouth daily.    unknown  . folic acid (FOLVITE) 1 MG tablet Take 1 mg by mouth daily.    unknown  . furosemide (LASIX) 40 MG tablet Take 80mg  po bid for 5 days then 80mg  po daily (Patient taking differently: Take 80 mg by mouth daily. ) 180 tablet 3 unknown  . gabapentin (NEURONTIN) 300 MG capsule Take 300 mg by mouth 3 (three) times daily as needed (for pain).    unknown  . glipiZIDE (GLUCOTROL) 10 MG tablet Take 10 mg by mouth 2 (two) times daily.   unknown  . Insulin Glargine (TOUJEO SOLOSTAR [place]) Inject 80 Units into the skin daily.   unknown  .  isosorbide mononitrate (IMDUR) 60 MG 24 hr tablet Take 1 tablet (60 mg total) by mouth daily. 90 tablet 1 unknown  . losartan (COZAAR) 25 MG tablet Take 0.5 tablets (12.5 mg total) by mouth daily. 30 tablet 6 unknown  . metFORMIN (GLUCOPHAGE) 1000 MG tablet Take 1 tablet (1,000 mg total) by mouth 2 (two) times daily with a meal. (Patient taking differently: Take 1,000-1,250 mg by mouth See admin instructions. Take 1500 mg by mouth in the morning and 1000 mg in the evening)   unknown  . methotrexate 50 MG/2ML injection Inject 2 mLs into the skin once a week. Takes every Thursday   unknown  . metoprolol tartrate (LOPRESSOR) 25 MG tablet TAKE 1 TABLET TWICE DAILY. KEEP JULY APPOINTMENT. (Patient taking differently: Take 12.5 mg by mouth 2  (two) times daily. ) 180 tablet 0 unknown  . nitroGLYCERIN (NITROSTAT) 0.4 MG SL tablet Place 1 tablet (0.4 mg total) under the tongue every 5 (five) minutes x 3 doses as needed for chest pain. 25 tablet 2 unknown  . potassium chloride SA (K-DUR) 20 MEQ tablet 1 TABLET BY MOUTH DAILY. 30 tablet 1 unknown  . predniSONE (DELTASONE) 5 MG tablet Take 5 mg by mouth as directed. Filled on 04-10-19 # 48 dose pak ...   unknown  . primidone (MYSOLINE) 50 MG tablet Take 100 mg by mouth daily.    unknown  . Tocilizumab 162 MG/0.9ML SOSY Inject 162 mg into the skin every Monday. ACTEMRA   unknown    Assessment: 44 YOM with prolonged COVID-19 associated hospital stay now with recurrent acute hypoxemic respiratory failure, suspicious for cardiac cause. Pharmacy consulted to start IV heparin for ACS.   Of note, previously started on IV heparin 9/2 for same then discontinued 9/3 due to bleeding from endotracheal tube during previous intubation and blood clots in urine (has catheter for retention); conservative medical management recommended by Cardiology.  Aspirin & plavix continued.   Hgb low but improved 10.2; Plts 155k yesterday  SCr 1.4  PTT, PT/INR pending   Goal of Therapy:  Heparin level 0.3-0.5 units/ml Monitor platelets by anticoagulation protocol: Yes   Plan:   Reduced dose heparin bolus 2500 units IV x 1   Heparin infusion 1600 units/hr  Heparin level in 8hrs  Monitor daily heparin level, CBC and s/s of bleeding  Peggyann Juba, PharmD, BCPS Pharmacy: [phone] 05/19/2019 6:47 PM

## 2019-05-19 NOTE — Progress Notes (Signed)
[facility] Progress Note Patient Name: Robert [name] DOB: Sep 29, 1950 MRN: [id number]   Date of Service  05/19/2019  HPI/Events of Note  Patient c/o facial itching - Request for Benadryl.   eICU Interventions  Will order: 1. Benadrl 12.5 mg IV Q 6 hours PRN itching.      Intervention Category Major Interventions: Other:  Lysle Dingwall 05/19/2019, 11:52 PM

## 2019-05-19 NOTE — Progress Notes (Signed)
Pt arrived to unit after rapid response transfer. Pt awake and responding appropriately. Pt in respiratory distress using accessory muscles and reports difficulty with breathing. 100% NRB in place. Dr. Sherral Hammers and Dr. Lamonte Sakai at bedside. Pt intubated.  4 mg Versed, 100 mcg of Fentanyl and 20 mg of Etomidate given as ordered. Pt tolerated well. Vital signs remained stable throughout intubation. IV Fentanyl infusion started.

## 2019-05-19 NOTE — Progress Notes (Signed)
Pt transported from 203-3 to 202-3 without event.  Will continue to monitor.

## 2019-05-19 NOTE — Procedures (Signed)
Intubation Procedure Note Robert [name] [id number] 04/27/1951  Procedure: Intubation Indications: Respiratory insufficiency  Procedure Details Consent: Risks of procedure as well as the alternatives and risks of each were explained to the (patient/caregiver).  Consent for procedure obtained. Time Out: Verified patient identification, verified procedure, site/side was marked, verified correct patient position, special equipment/implants available, medications/allergies/relevent history reviewed, required imaging and test results available.  Performed  Maximum sterile technique was used including cap, gloves, gown, hand hygiene, mask and sheet.   7.5 ETT placed on first pass using 4 Mac glidescope. Secured at 23cm lip. Positioning confirmed by direct visualization, ETCO2 and auscultation. No complications noted.      Evaluation Hemodynamic Status: BP stable throughout; O2 sats: stable throughout Patient's Current Condition: stable Complications: No apparent complications Patient did tolerate procedure well. Chest X-ray ordered to verify placement.  CXR: pending.   Baltazar Apo, MD, PhD 05/19/2019, 6:29 PM [facility] [phone] or if no answer [phone]

## 2019-05-19 NOTE — Progress Notes (Addendum)
Educated pt on incentive spirometer and flutter valve use. Pt stated he was using both just once per day. Encouraged pt to use once per hour instead and observed use.

## 2019-05-20 DIAGNOSIS — I248 Other forms of acute ischemic heart disease: Secondary | ICD-10-CM | POA: Diagnosis not present

## 2019-05-20 DIAGNOSIS — J810 Acute pulmonary edema: Secondary | ICD-10-CM | POA: Diagnosis not present

## 2019-05-20 LAB — CBC WITH DIFFERENTIAL/PLATELET
Abs Immature Granulocytes: 0.05 10*3/uL (ref 0.00–0.07)
Basophils Absolute: 0.1 10*3/uL (ref 0.0–0.1)
Basophils Relative: 1 %
Eosinophils Absolute: 0.2 10*3/uL (ref 0.0–0.5)
Eosinophils Relative: 3 %
HCT: 26.2 % — ABNORMAL LOW (ref 39.0–52.0)
Hemoglobin: 8.3 g/dL — ABNORMAL LOW (ref 13.0–17.0)
Immature Granulocytes: 1 %
Lymphocytes Relative: 17 %
Lymphs Abs: 1.2 10*3/uL (ref 0.7–4.0)
MCH: 29.7 pg (ref 26.0–34.0)
MCHC: 31.7 g/dL (ref 30.0–36.0)
MCV: 93.9 fL (ref 80.0–100.0)
Monocytes Absolute: 0.5 10*3/uL (ref 0.1–1.0)
Monocytes Relative: 6 %
Neutro Abs: 5.4 10*3/uL (ref 1.7–7.7)
Neutrophils Relative %: 72 %
Platelets: 245 10*3/uL (ref 150–400)
RBC: 2.79 MIL/uL — ABNORMAL LOW (ref 4.22–5.81)
RDW: 15.9 % — ABNORMAL HIGH (ref 11.5–15.5)
WBC: 7.4 10*3/uL (ref 4.0–10.5)
nRBC: 0 % (ref 0.0–0.2)

## 2019-05-20 LAB — COMPREHENSIVE METABOLIC PANEL
ALT: 68 U/L — ABNORMAL HIGH (ref 0–44)
AST: 27 U/L (ref 15–41)
Albumin: 2.3 g/dL — ABNORMAL LOW (ref 3.5–5.0)
Alkaline Phosphatase: 120 U/L (ref 38–126)
Anion gap: 13 (ref 5–15)
BUN: 25 mg/dL — ABNORMAL HIGH (ref 8–23)
CO2: 28 mmol/L (ref 22–32)
Calcium: 9.1 mg/dL (ref 8.9–10.3)
Chloride: 96 mmol/L — ABNORMAL LOW (ref 98–111)
Creatinine, Ser: 1.61 mg/dL — ABNORMAL HIGH (ref 0.61–1.24)
GFR calc Af Amer: 51 mL/min — ABNORMAL LOW (ref >60)
GFR calc non Af Amer: 44 mL/min — ABNORMAL LOW (ref >60)
Glucose, Bld: 87 mg/dL (ref 70–99)
Potassium: 4.2 mmol/L (ref 3.5–5.1)
Sodium: 137 mmol/L (ref 135–145)
Total Bilirubin: 0.3 mg/dL (ref 0.3–1.2)
Total Protein: 7 g/dL (ref 6.5–8.1)

## 2019-05-20 LAB — HEPARIN LEVEL (UNFRACTIONATED)
Heparin Unfractionated: <0.1 IU/mL — ABNORMAL LOW (ref 0.30–0.70)
Heparin Unfractionated: 0.19 IU/mL — ABNORMAL LOW (ref 0.30–0.70)
Heparin Unfractionated: 0.4 IU/mL (ref 0.30–0.70)

## 2019-05-20 LAB — GLUCOSE, CAPILLARY
Glucose-Capillary: 215 mg/dL — ABNORMAL HIGH (ref 70–99)
Glucose-Capillary: 81 mg/dL (ref 70–99)
Glucose-Capillary: 79 mg/dL (ref 70–99)
Glucose-Capillary: 90 mg/dL (ref 70–99)
Glucose-Capillary: 71 mg/dL (ref 70–99)

## 2019-05-20 LAB — MAGNESIUM: Magnesium: 2.2 mg/dL (ref 1.7–2.4)

## 2019-05-20 LAB — PHOSPHORUS: Phosphorus: 4.6 mg/dL (ref 2.5–4.6)

## 2019-05-20 MED ORDER — HEPARIN BOLUS VIA INFUSION
2000.0000 [IU] | Freq: Once | INTRAVENOUS | Status: AC
  Administered 2019-05-20: 14:00:00 2000 [IU] via INTRAVENOUS
  Filled 2019-05-20: qty 2000

## 2019-05-20 MED ORDER — ALBUTEROL SULFATE HFA 108 (90 BASE) MCG/ACT IN AERS
2.0000 | INHALATION_SPRAY | Freq: Four times a day (QID) | RESPIRATORY_TRACT | Status: DC | PRN
  Filled 2019-05-20: qty 6.7

## 2019-05-20 MED ORDER — MOMETASONE FURO-FORMOTEROL FUM 200-5 MCG/ACT IN AERO
2.0000 | INHALATION_SPRAY | Freq: Two times a day (BID) | RESPIRATORY_TRACT | Status: DC
  Administered 2019-05-20 – 2019-05-21 (×2): 2 via RESPIRATORY_TRACT
  Filled 2019-05-20: qty 8.8

## 2019-05-20 MED ORDER — HEPARIN BOLUS VIA INFUSION
2500.0000 [IU] | Freq: Once | INTRAVENOUS | Status: AC
  Administered 2019-05-20: 07:00:00 2500 [IU] via INTRAVENOUS
  Filled 2019-05-20: qty 2500

## 2019-05-20 MED ORDER — HEPARIN (PORCINE) 25000 UT/250ML-% IV SOLN
2100.0000 [IU]/h | INTRAVENOUS | Status: DC
  Administered 2019-05-20 – 2019-05-21 (×3): 2100 [IU]/h via INTRAVENOUS
  Filled 2019-05-20 (×2): qty 250

## 2019-05-20 MED ORDER — NYSTATIN 100000 UNIT/GM EX POWD
Freq: Three times a day (TID) | CUTANEOUS | Status: DC
  Administered 2019-05-20 – 2019-05-22 (×6): via TOPICAL
  Filled 2019-05-20 (×2): qty 15

## 2019-05-20 MED ORDER — MOMETASONE FURO-FORMOTEROL FUM 200-5 MCG/ACT IN AERO: 2.0000 | INHALATION_SPRAY | Freq: Two times a day (BID) | RESPIRATORY_TRACT | Status: DC

## 2019-05-20 MED ORDER — HEPARIN (PORCINE) 25000 UT/250ML-% IV SOLN
1900.0000 [IU]/h | INTRAVENOUS | Status: DC
  Administered 2019-05-20: 11:00:00 1900 [IU]/h via INTRAVENOUS
  Filled 2019-05-20: qty 250

## 2019-05-20 NOTE — Progress Notes (Addendum)
ANTICOAGULATION CONSULT NOTE - [place] for Heparin Indication: chest pain/ACS  Allergies  Allergen Reactions  . Ivp Dye [Iodinated Diagnostic Agents] Itching and Rash    Patient Measurements: Height: 6' (182.9 cm) Weight: (No scale on the bed) IBW/kg (Calculated) : 77.6 Heparin Dosing Weight: 99 kg   Vital Signs: Temp: 98.6 F (37 C) (09/08 0346) Temp Source: Axillary (09/08 0346) BP: 92/61 (09/08 0500) Pulse Rate: 87 (09/08 0346)  Labs: Recent Labs    05/18/19 0112 05/19/19 1745 05/19/19 1915 05/19/19 1943 05/19/19 2115 05/20/19 0530  HGB 8.3* 10.2*  --  9.5*  --  8.3*  HCT 25.6* 30.0*  --  28.0*  --  26.2*  PLT 155  --   --   --   --  245  APTT  --   --  40*  --   --   --   LABPROT  --   --  15.4*  --   --   --   INR  --   --  1.2  --   --   --   HEPARINUNFRC  --   --   --   --   --  <0.10*  CREATININE 1.40*  --  1.34*  --   --   --   TROPONINIHS  --   --  46*  --  49*  --     Estimated Creatinine Clearance: 68.3 mL/min (A) (by C-G formula based on SCr of 1.34 mg/dL (H)).   Medical History: Past Medical History:  Diagnosis Date  . Anxiety   . COPD (chronic obstructive pulmonary disease) ([place])   . Coronary artery disease   . Full dentures   . GERD (gastroesophageal reflux disease)    Rolaids as needed  . High cholesterol   . History of MI (myocardial infarction)   . Hypertension    med. dosage increased 04/2016; has been on BP med. x 10 yrs.  . Incarcerated umbilical hernia 0000000  . Inguinal hernia 05/2016   bilateral   . Insulin dependent diabetes mellitus ([place])   . Ischemic cardiomyopathy    a. 03/2015 EF 40-45% by LV gram.  . Left leg cellulitis 10/08/2016  . Rheumatoid arthritis(714.0)     Medications:  Medications Prior to Admission  Medication Sig Dispense Refill Last Dose  . acarbose (PRECOSE) 100 MG tablet Take 100 mg by mouth 3 (three) times daily with meals.    unknown  . acetaminophen (TYLENOL) 650 MG CR  tablet Take 650 mg by mouth every 8 (eight) hours as needed for pain.   unknown  . ALPRAZolam (XANAX) 0.5 MG tablet Take 0.5 mg by mouth 3 (three) times daily as needed for anxiety.    unknown  . AMBULATORY NON FORMULARY MEDICATION Take 600 mg by mouth daily at 12 noon. Medication Name: Dalcetrapib vs placebo (Patient taking differently: Take 600 mg by mouth every evening. Medication Name: Dalcetrapib vs placebo: Cholesterol)   unknown  . aspirin EC 81 MG EC tablet Take 1 tablet (81 mg total) by mouth daily.   unknown  . atorvastatin (LIPITOR) 80 MG tablet TAKE 1 TABLET DAILY AT 6 PM. (Patient taking differently: Take 80 mg by mouth daily at 6 PM. ) 90 tablet 3 unknown  . clopidogrel (PLAVIX) 75 MG tablet TAKE (1) TABLET BY MOUTH ONCE DAILY. 90 tablet 0 unknown  . clotrimazole-betamethasone (LOTRISONE) cream Apply 1 application topically 2 (two) times daily.   unknown  . fish oil-omega-3  fatty acids 1000 MG capsule Take 1 g by mouth daily.    unknown  . folic acid (FOLVITE) 1 MG tablet Take 1 mg by mouth daily.    unknown  . furosemide (LASIX) 40 MG tablet Take 80mg  po bid for 5 days then 80mg  po daily (Patient taking differently: Take 80 mg by mouth daily. ) 180 tablet 3 unknown  . gabapentin (NEURONTIN) 300 MG capsule Take 300 mg by mouth 3 (three) times daily as needed (for pain).    unknown  . glipiZIDE (GLUCOTROL) 10 MG tablet Take 10 mg by mouth 2 (two) times daily.   unknown  . Insulin Glargine (TOUJEO SOLOSTAR [place]) Inject 80 Units into the skin daily.   unknown  . isosorbide mononitrate (IMDUR) 60 MG 24 hr tablet Take 1 tablet (60 mg total) by mouth daily. 90 tablet 1 unknown  . losartan (COZAAR) 25 MG tablet Take 0.5 tablets (12.5 mg total) by mouth daily. 30 tablet 6 unknown  . metFORMIN (GLUCOPHAGE) 1000 MG tablet Take 1 tablet (1,000 mg total) by mouth 2 (two) times daily with a meal. (Patient taking differently: Take 1,000-1,250 mg by mouth See admin instructions. Take 1500 mg by mouth in  the morning and 1000 mg in the evening)   unknown  . methotrexate 50 MG/2ML injection Inject 2 mLs into the skin once a week. Takes every Thursday   unknown  . metoprolol tartrate (LOPRESSOR) 25 MG tablet TAKE 1 TABLET TWICE DAILY. KEEP JULY APPOINTMENT. (Patient taking differently: Take 12.5 mg by mouth 2 (two) times daily. ) 180 tablet 0 unknown  . nitroGLYCERIN (NITROSTAT) 0.4 MG SL tablet Place 1 tablet (0.4 mg total) under the tongue every 5 (five) minutes x 3 doses as needed for chest pain. 25 tablet 2 unknown  . potassium chloride SA (K-DUR) 20 MEQ tablet 1 TABLET BY MOUTH DAILY. 30 tablet 1 unknown  . predniSONE (DELTASONE) 5 MG tablet Take 5 mg by mouth as directed. Filled on 04-10-19 # 48 dose pak ...   unknown  . primidone (MYSOLINE) 50 MG tablet Take 100 mg by mouth daily.    unknown  . Tocilizumab 162 MG/0.9ML SOSY Inject 162 mg into the skin every Monday. ACTEMRA   unknown    Assessment: 41 YOM with prolonged COVID-19 associated hospital stay now with recurrent acute hypoxemic respiratory failure, suspicious for cardiac cause. Pharmacy consulted to start IV heparin for ACS.   Of note, previously started on IV heparin 9/2 for same then discontinued 9/3 due to bleeding from endotracheal tube during previous intubation and blood clots in urine (has catheter for retention); conservative medical management recommended by Cardiology.  Aspirin & plavix continued.   Hgb decreased to 8.3; Plts 245k yesterday  SCr 1.4  No line or bleeding issues per RN    Goal of Therapy:  Heparin level 0.3-0.5 units/ml Monitor platelets by anticoagulation protocol: Yes   Plan:   Reduced dose heparin bolus 2500 units IV x 1   Increase heparin infusion 1900 units/hr  Heparin level in 6 hrs  Monitor daily heparin level, CBC and s/s of bleeding   Royetta Asal, PharmD, BCPS 05/20/2019 6:55 AM

## 2019-05-20 NOTE — Progress Notes (Addendum)
PROGRESS NOTE    Robert [name]  [id number] DOB: 07-23-1951 DOA: 04/11/2019 PCP: Redmond School, MD   Brief Narrative:  68 year old WM PMHx COPD, CAD, diabetes mellitus, ischemic cardiomyopathy, RA,   Presented to the hospital and was admitted on 04/11/2019 with shortness of breath, he was found to be significantly hypoxic, tested positive for COVID-19, was intubated and transferred to [facility] for further management.  Hospital course complicated by persistent encephalopathy and difficulty weaning due to tachypnea and agitation, he failed extubation once on 8/2.  Hospital course was also complicated by MRSA pneumonia with septic shock at the beginning of August status post completed treatment. Patient eventually was eventually successfully extubated on 8/22 and transferred to the floor on 8/24   Subjective: 9/8 extubated today, follows some commands but positive altered mental status most likely secondary to fentanyl drip.  Negative S OB, negative abdominal pain, negative CP.     Assessment & Plan:   Principal Problem:   Pneumonia due to COVID-19 virus Active Problems:   Rheumatoid arthritis ([place])   Hyperlipidemia   COPD (chronic obstructive pulmonary disease) (HCC)   Controlled type 2 diabetes mellitus with diabetic peripheral angiopathy without gangrene, with long-term current use of insulin (HCC)   Essential hypertension   Chronic combined systolic and diastolic CHF (congestive heart failure) (HCC)   Ischemic cardiomyopathy   Coronary artery disease   Dyslipidemia (high LDL; low HDL)   Acute respiratory failure with hypoxemia (HCC)   COVID-19 virus detected   Acute respiratory distress syndrome (ARDS) due to COVID-19 virus   Atrial fibrillation with RVR (HCC)   COPD (chronic obstructive pulmonary disease) with emphysema (HCC)   AKI (acute kidney injury) ([place])   Diabetes mellitus type 2, uncontrolled, with complications (HCC)   Pressure injury of skin   Staphylococcal  pneumonia ([place])   HCAP (healthcare-associated pneumonia)   Aspiration pneumonia of both lower lobes due to gastric secretions (HCC)   MRSA pneumonia (HCC)   Severe sepsis with septic shock (HCC)   NSTEMI (non-ST elevated myocardial infarction) ([place])   Stage II decubitus ulcer ([place])   Demand ischemia ([place])   Flash pulmonary edema (HCC)   Acute Hypoxic Respiratory Failure with hypoxia/ Covid-19 pneumonia/ COPD exacerbation -Patient continues to smoke 1.5 PPD until admission to the hospital - Titrate O2 to maintain SPO2 89 to 93% - Currently on 8 L/min O2, SPO2 92% - Completed course of Remdesivir - Completed course of Decadron/Solu-Medrol - Completed Actemra 8/1 - Flutter valve - Albuterol QID -PT/OT recommends CIR. -Ambulate patient every shift -We will need to obtain ambulatory SPO2 was prior to discharge - 9/7 patient desats at night which requires 15 L O2 via HF [place] CIR unable to accommodate this flow rate.  Discussed case with Dr. Lamonte Sakai PCCM concurs that since patient has COVID x2 negative tests try patient on CPAP auto titration.  In addition Claiborne Billings from [facility] will check with their administration to ensure that if CPAP works for patient tonight they can accept him in [place] with 2 negative  COVID tests on CPAP 9/7 ADDENDUM; just prior to patient leaving an ambulance respiratory status declined abruptly without cause.  Attempts to stabilize were unsuccessful.  Patient transported up to ICU and intubated.  Because of rapid decline appears to be more likely cardiac in nature.     Aspiration pneumonia -9/1 PCXR shows continued aspiration pneumonia see below -Complete 5-day course of Unasyn -Speech evaluated patient dysphagia 1 diet with honey thick liquid.  MRSA Earlie Lou  shock -Off pressors - Completed course antibiotic  NSTEMI/CAD - Hx CAD -On 9/2 echocardiogram LVEF 50 to 55%; see results below -Known chronic total occlusion of RCA, 60% stenosis proximal LAD and 60% stenosis  ostial first diagonal artery. 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 -Patient's troponin troponin trending up significantly [phone], -Medical management per cardiology -Strict in and out -22 L -Daily weight Filed Weights   05/06/19 0500 05/07/19 0235  Weight: 111 kg 106.1 kg  - 9/5 Lasix 40 mg daily ( Hold creatinine trending up) - Hydralazine PRN - Metoprolol 50 mg BID  Acute on chronic systolic and diastolic CHF/demand ischemia - See NSTEMI - 9/7 mildly elevated troponin not consistent with MI.  (Troponin high high-sensitivity; 46/49); more consistent with Cardiac Demand  Ischemia--> Pulmonary Edema -9/8 discussed case Dr. Marijo File cardiology agree patient requires further ischemic work-up.  Will see patient tomorrow at [facility] after [place].  Patient to remain on triad service as primary second to multiple medical problems.  Requested through Flow  Management 2H or 26M bed  Paroxysmal atrial fib with RVR -Currently NSR - See NSTEMI  Diabetes type 2 uncontrolled with complication (neuropathy, vascular disease)  - 7/31 hemoglobin A1c= 7.4 -9/7 increase Lantus 28 units (hold) -Resistant SSI -9/7 increase NovoLog 15 unitsqac (hold) -Restart scheduled insulin once patient begins to eat again.  HLD -Lipid panel within AHA/ADA guidelines -LDL<70 - Continue Lipitor 80 mg daily;  Deconditioning/ICU induced muscle wasting -9/3 PT OT consult; recommend CIR  Hypernatremia -Resolved  Hypokalemia - Potassium goal> 4  Hypomagnesmia - Magnesium goal>2  Acute kidney injury (baseline Cr 0.80) Lab Results  Component Value Date   CREATININE 1.61 (H) 05/20/2019   CREATININE 1.34 (H) 05/19/2019   CREATININE 1.40 (H) 05/18/2019  - Not yet back to baseline see NSTEMI  Acute  urinary retention - Patient has had multiple voiding trial failures.  Last one was 9/2 overnight. - Patient with in and out cath x2 overnight bloody with blood clots. -  Heparin has been DC'd, reinsert Foley if urine does not clear will require urology consult - Continue Flomax 0.8 mg daily -9/8 urine clear but patient still has not passed a voiding trial once at: May want to consult urology.  ETT clotting/bleeding from subclavian line -ET tube clotted exchanged on 8/12 -Resolved s/p discontinuation of heparin on 9/3  Thrombocytopenia - Improving continue to monitor: Stable  RA -He is on Actemra and methotrexate at home, currently on hold  Pressure ulcer of medial sacrum bilateral buttocks stage II -Wound care service consulted, will follow recommendations Pressure Injury 04/17/19 Sacrum Medial Deep Tissue Injury - Purple or maroon localized area of discolored intact skin or blood-filled blister due to damage of underlying soft tissue from pressure and/or shear. dark purple with some skin sloughing on lowe (Active)  04/17/19 0730  Location: Sacrum  Location Orientation: Medial  Staging: Deep Tissue Injury - Purple or maroon localized area of discolored intact skin or blood-filled blister due to damage of underlying soft tissue from pressure and/or shear.  Wound Description (Comments): dark purple with some skin sloughing on lower sacral area  Present on Admission: No     Pressure Injury 04/17/19 Buttocks Right;Lower Unstageable - Full thickness tissue loss in which the base of the ulcer is covered by slough (yellow, tan, gray, green or brown) and/or eschar (tan, brown or black) in the wound bed. (Active)  04/17/19 0730  Location: Buttocks  Location Orientation: Right;Lower  Staging: Unstageable -  Full thickness tissue loss in which the base of the ulcer is covered by slough (yellow, tan, gray, green or brown) and/or eschar (tan, brown or black) in the wound bed.  Wound Description (Comments):   Present on Admission: No     Pressure Injury 04/17/19 Buttocks Left;Lower WOC updated to Stage 3 on 05/06/19 at the time of assessment (Active)  04/17/19 0730   Location: Buttocks  Location Orientation: Left;Lower  Staging:   Wound Description (Comments): WOC updated to Stage 3 on 05/06/19 at the time of assessment  Present on Admission: No    Goals of care - 9/4 PT/OT consult.  Patient with ischemic cardiomyopathy, s/p COVID pneumonia evaluate for SNF vs CIR.  Work with patient daily.      DVT prophylaxis: SCD Code Status: Full Family Communication: 9/8 spoke with Dortha Kern (daughter) counseled her on plan of care.  Answered all questions  Disposition Plan: TBD   Consultants:  PCCM Phone consult cardiology Dr. Marijo File    Procedures/Significant Events:  8/1 admission to [facility] ICU 8/2 extubated 8/3 early AM agitated, placed on BIPAP, required re-intubation 8/4 started on steroids for COPD exacerbation 8/8 septic shock requiring pressor support 8/9 off pressors, respiratory cultures +s.aureus 8/10 persistent encephalopathy off sedation, CT head ordered 8/11 encephalopathy improved 8/12 difficulty weaning due to tachypnea, ETT exchanged due to clot burden 8/13 PICC line inserted and subclavian central line removed 8/14 Antibiotics coverage broaden due to persistent fever and resp distress, pan cultured  8/19 more alert, tolerating pressure support 8/20 failed voiding trial, Foley needed to be reinserted 8/22 successfully extubated  8/24 transfer to the floor 9/1  transferred to ICU given worsening respiratory failure, increased work of breathing 9/1 PCXR:-Opacification of the lower right hemithorax may represent combination of pleural fluid and airspace disease, pneumonia vs aspiration. -Increased left pleural effusion since prior exam.  -Progressive interstitial opacities from prior likely combination of pulmonary edema and atypical pneumonia. 9/2 echocardiogram: LVEF = 50-55%.-mildly dilated. moderate basal septal hypertrophy.  -Left ventricular diastolic Doppler parameters are consistent with restrictive filling.   Mitral valve regurgitation: mild -moderate . 9/7 PCXR;-interval placement of endotracheal and nasogastric tubes asabove. - Bilateral pulmonary infiltrates suspicious for multifocal infection and/or edema. 9/7 ADDENDUM; just prior to patient leaving an ambulance respiratory status declined abruptly without cause.  Attempts to stabilize were unsuccessful.  Patient transported up to ICU and intubated.  Because of rapid decline appears to be more likely cardiac in nature. 9/7 intubated secondary to respiratory distress>> 9/8 extubated    I have personally reviewed and interpreted all radiology studies and my findings are as above.  VENTILATOR SETTINGS: 9/8 [employer] Flow rate 5 L/min SPO2 90%     Cultures 7/31 blood negative final 7/31 RIGHT arm negative 7/31 urine negative 8/1 tracheal aspirate respiratory flora 8/7 blood right AC negative 8/7 blood RIGHT hand negative 8/7 tracheal aspirate positive MRSA 8/8 urine negative 8/14 blood LEFT hand negative 8/14 blood RIGHT hand negative 8/15 tracheal aspirate positive MRSA 9/1 MRSA by PCR positive      Antimicrobials: Anti-infectives (From admission, onward)   Start     Stop   05/14/19 0430  Ampicillin-Sulbactam (UNASYN) 3 g in sodium chloride 0.9 % 100 mL IVPB     05/18/19 2359   04/30/19 2200  vancomycin (VANCOCIN) 1,500 mg in sodium chloride 0.9 % 500 mL IVPB     05/03/19 1900   04/29/19 0900  vancomycin (VANCOCIN) 1,500 mg in sodium chloride 0.9 % 500 mL  IVPB  Status:  Discontinued     04/29/19 2324   04/27/19 1800  ceFEPIme (MAXIPIME) 2 g in sodium chloride 0.9 % 100 mL IVPB  Status:  Discontinued     04/29/19 1316   04/26/19 1630  vancomycin (VANCOCIN) 1,750 mg in sodium chloride 0.9 % 500 mL IVPB  Status:  Discontinued     04/29/19 0847   04/25/19 1400  vancomycin (VANCOCIN) IVPB 1000 mg/200 mL premix     04/25/19 1456   04/25/19 1300  ceFEPIme (MAXIPIME) 2 g in sodium chloride 0.9 % 100 mL IVPB  Status:  Discontinued      04/27/19 1319   04/23/19 1921  vancomycin variable dose per unstable renal function (pharmacist dosing)  Status:  Discontinued     04/25/19 1252   04/22/19 2200  dextrose 5 % 250 mL with vancomycin (VANCOCIN) 1,250 mg infusion  Status:  Discontinued     04/23/19 1921   04/20/19 1400  vancomycin (VANCOCIN) 1,250 mg in sodium chloride 0.9 % 250 mL IVPB  Status:  Discontinued     04/20/19 1215   04/20/19 1400  vancomycin (VANCOCIN) 1,250 mg in sodium chloride 0.9 % 250 mL IVPB  Status:  Discontinued     04/20/19 1220   04/20/19 1400  Vancomycin HCl in Dextrose 1.25-5 GM/250ML-% SOLN 1,250 mg  Status:  Discontinued     04/20/19 1221   04/20/19 1400  Vancomycin 1250mg  in D5W  Status:  Discontinued     04/20/19 1233   04/20/19 1400  Vancomycin 1250mg  in D5W  Status:  Discontinued     04/22/19 1324   04/19/19 2200  piperacillin-tazobactam (ZOSYN) IVPB 3.375 g  Status:  Discontinued     04/21/19 1533   04/19/19 1430  vancomycin (VANCOCIN) 2,000 mg in sodium chloride 0.9 % 500 mL IVPB     04/19/19 1806   04/19/19 1400  piperacillin-tazobactam (ZOSYN) IVPB 3.375 g     04/19/19 1530   04/19/19 1000  azithromycin (ZITHROMAX) tablet 250 mg  Status:  Discontinued     04/20/19 1655   04/18/19 1300  azithromycin (ZITHROMAX) tablet 500 mg     04/18/19 1351   04/13/19 1000  remdesivir 100 mg in sodium chloride 0.9 % 250 mL IVPB     04/16/19 1103   04/12/19 1000  remdesivir 200 mg in sodium chloride 0.9 % 250 mL IVPB     04/12/19 1317   04/12/19 0600  ceFEPIme (MAXIPIME) 2 g in sodium chloride 0.9 % 100 mL IVPB  Status:  Discontinued     04/15/19 1452   04/12/19 0200  vancomycin (VANCOCIN) IVPB 750 mg/150 ml premix  Status:  Discontinued     04/12/19 1455   04/11/19 2130  ceFEPIme (MAXIPIME) 2 g in sodium chloride 0.9 % 100 mL IVPB     04/11/19 2257   04/11/19 2130  metroNIDAZOLE (FLAGYL) IVPB 500 mg     04/11/19 2356   04/11/19 2130  vancomycin (VANCOCIN) IVPB 1000 mg/200 mL premix      04/12/19 0105       Devices    LINES / TUBES:      Continuous Infusions:  sodium chloride Stopped (05/20/19 0603)   fentaNYL infusion INTRAVENOUS Stopped (05/20/19 0837)   heparin       Objective: Vitals:   05/20/19 0900 05/20/19 1000 05/20/19 1009 05/20/19 1100  BP: 109/62 116/69  120/64  Pulse:   (!) 102   Resp: 16 19 20 15   Temp:  TempSrc:      SpO2:  97% 93% 90%  Weight:      Height:        Intake/Output Summary (Last 24 hours) at 05/20/2019 1402 Last data filed at 05/20/2019 0800 Gross per 24 hour  Intake 627.05 ml  Output 710 ml  Net -82.95 ml   Filed Weights   05/06/19 0500 05/07/19 0235  Weight: 111 kg 106.1 kg   Physical Exam:  General: Alert/oriented x1 (does not know where, when, why), most likely secondary to fentanyl drip, positive  acute respiratory distress Eyes: negative scleral hemorrhage, negative anisocoria, negative icterus ENT: Negative Runny nose, negative gingival bleeding, Neck:  Negative scars, masses, torticollis, lymphadenopathy, JVD Lungs: Clear to auscultation bilaterally without wheezes or crackles Cardiovascular: Regular rate and rhythm without murmur gallop or rub normal S1 and S2 Abdomen: negative abdominal pain, nondistended, positive soft, bowel sounds, no rebound, no ascites, no appreciable mass Extremities: No significant cyanosis, clubbing, or edema bilateral lower extremities Skin: Negative rashes, lesions, ulcers Psychiatric:  Negative depression, negative anxiety, negative fatigue, negative mania  Central nervous system:  Cranial nerves II through XII intact, tongue/uvula midline, all extremities muscle strength 5/5, sensation intact throughout, negative dysarthria, negative expressive aphasia, negative receptive aphasia.        Data Reviewed: Care during the described time interval was provided by me .  I have reviewed this patient's available data, including medical history, events of note, physical  examination, and all test results as part of my evaluation.   CBC: Recent Labs  Lab 05/13/19 2220  05/15/19 0610 05/16/19 0139 05/17/19 0114 05/18/19 0112 05/19/19 1745 05/19/19 1943 05/20/19 0530  WBC 10.2   < > 6.5 6.0 5.9 5.9  --   --  7.4  NEUTROABS 7.4  --   --   --   --   --   --   --  5.4  HGB 10.7*   < > 8.5* 8.0* 8.3* 8.3* 10.2* 9.5* 8.3*  HCT 34.1*   < > 26.5* 25.1* 26.2* 25.6* 30.0* 28.0* 26.2*  MCV 95.8   < > 93.6 94.7 93.9 94.8  --   --  93.9  PLT 175   < > 147* 149* 159 155  --   --  245   < > = values in this interval not displayed.   Basic Metabolic Panel: Recent Labs  Lab 05/16/19 0139 05/17/19 0114 05/18/19 0112 05/19/19 0405 05/19/19 1745 05/19/19 1915 05/19/19 1943 05/20/19 0530  NA 137 137 138  --  136 136 136 137  K 3.5 4.3 3.9  --  4.1 4.3 4.6 4.2  CL 97* 95* 95*  --   --  96*  --  96*  CO2 30 32 33*  --   --  28  --  28  GLUCOSE 191* 175* 125*  --   --  266*  --  87  BUN 23 25* 22  --   --  23  --  25*  CREATININE 1.34* 1.52* 1.40*  --   --  1.34*  --  1.61*  CALCIUM 8.6* 8.5* 8.5*  --   --  9.1  --  9.1  MG 1.7 1.7 1.7 2.0  --  1.9  --  2.2  PHOS  --   --   --   --   --   --   --  4.6   GFR: Estimated Creatinine Clearance: 56.8 mL/min (A) (by C-G formula based on SCr of  1.61 mg/dL (H)). Liver Function Tests: Recent Labs  Lab 05/16/19 0139 05/17/19 0114 05/18/19 0112 05/19/19 1915 05/20/19 0530  AST 31 37 44* 37 27  ALT 58* 68* 79* 80* 68*  ALKPHOS 132* 144* 131* 138* 120  BILITOT 0.2* 0.5 0.2* 0.6 0.3  PROT 6.3* 6.6 6.4* 7.3 7.0  ALBUMIN 2.0* 2.2* 2.1* 2.5* 2.3*   No results for input(s): LIPASE, AMYLASE in the last 168 hours. No results for input(s): AMMONIA in the last 168 hours. Coagulation Profile: Recent Labs  Lab 05/19/19 1915  INR 1.2   Cardiac Enzymes: No results for input(s): CKTOTAL, CKMB, CKMBINDEX, TROPONINI in the last 168 hours. BNP (last 3 results) No results for input(s): PROBNP in the last 8760  hours. HbA1C: No results for input(s): HGBA1C in the last 72 hours. CBG: Recent Labs  Lab 05/19/19 1118 05/19/19 1648 05/19/19 2152 05/20/19 0848 05/20/19 1257  GLUCAP 162* 215* 281* 81 79   Lipid Profile: Recent Labs    05/18/19 0112  CHOL 83  HDL 28*  LDLCALC 44  TRIG 57  CHOLHDL 3.0   Thyroid Function Tests: No results for input(s): TSH, T4TOTAL, FREET4, T3FREE, THYROIDAB in the last 72 hours. Anemia Panel: No results for input(s): VITAMINB12, FOLATE, FERRITIN, TIBC, IRON, RETICCTPCT in the last 72 hours. Urine analysis:    Component Value Date/Time   COLORURINE YELLOW 04/25/2019 0826   APPEARANCEUR HAZY (A) 04/25/2019 0826   LABSPEC 1.015 04/25/2019 0826   PHURINE 5.0 04/25/2019 0826   GLUCOSEU 150 (A) 04/25/2019 0826   HGBUR LARGE (A) 04/25/2019 0826   BILIRUBINUR NEGATIVE 04/25/2019 0826   KETONESUR NEGATIVE 04/25/2019 0826   PROTEINUR 30 (A) 04/25/2019 0826   UROBILINOGEN 0.2 12/22/2011 2130   NITRITE NEGATIVE 04/25/2019 0826   LEUKOCYTESUR NEGATIVE 04/25/2019 0826   Sepsis Labs: @LABRCNTIP (procalcitonin:4,lacticidven:4)  ) Recent Results (from the past 240 hour(s))  Novel Coronavirus, NAA (hospital order; send-out to ref lab)     Status: None   Collection Time: 05/11/19  1:58 PM   Specimen: Nasopharyngeal Swab; Respiratory  Result Value Ref Range Status   SARS-CoV-2, NAA NOT DETECTED NOT DETECTED Final    Comment: (NOTE) This nucleic acid amplification test was developed and its perfomance characteristics determined by [employer]. Nucleic acid amplification tests include PCR and TMA. This test has not been FDA cleared or approved. This test has been authorized by FDA under an Emergency Use Authorization (EUA). This test is only authorized for the duration of time the declaration that circumstances exist justifying the authorization of the emergency use of in vitro diagnostic tests for detection of SARS-CoV-2 virus and/or diagnosis of  COVID-19 infection under section 564(b)(1) of the Act, 21 U.S.C. [id number]) (1), unless the authorization is terminated or revoked sooner. When diagnostic testing is negative, the possibility of a false negative result should be considered in the context of a patient's recent exposures and the presence of clinical signs and symptoms consistent with COVID-19. An individual without symptoms of COVID- 19 and who is not shedding SARS-CoV-2 vir [place] would expect to have a negative (not detected) result in this assay. Performed At: [facility] [place], [place] [id number] Rush Farmer MD [id number]    [place]  Final    Comment: Performed at [facility] [address], [place], [place] [address]  MRSA PCR Screening     Status: Abnormal   Collection Time: 05/13/19 11:29 PM   Specimen: Nasopharyngeal  Result Value Ref Range Status  MRSA by PCR POSITIVE (A) NEGATIVE Final    Comment:        The GeneXpert MRSA Assay (FDA approved for NASAL specimens only), is one component of a comprehensive MRSA colonization surveillance program. It is not intended to diagnose MRSA infection nor to guide or monitor treatment for MRSA infections. CRITICAL RESULT CALLED TO, READ BACK BY AND VERIFIED WITH: MOSLEY, L. RN @0800  ON 9.2.2020 BY [facility] Performed at [facility], [place] [address] [place], [place] [address]   Novel Coronavirus, NAA (hospital order; send-out to ref lab)     Status: None   Collection Time: 05/14/19 12:17 PM   Specimen: Nasopharyngeal Swab; Respiratory  Result Value Ref Range Status   SARS-CoV-2, NAA NOT DETECTED NOT DETECTED Final    Comment: (NOTE) This nucleic acid amplification test was developed and its performance characteristics determined by [employer]. Nucleic acid amplification tests include PCR and TMA. This test has not been FDA cleared or approved. This test has  been authorized by FDA under an Emergency Use Authorization (EUA). This test is only authorized for the duration of time the declaration that circumstances exist justifying the authorization of the emergency use of in vitro diagnostic tests for detection of SARS-CoV-2 virus and/or diagnosis of COVID-19 infection under section 564(b)(1) of the Act, 21 U.S.C. [id number]) (1), unless the authorization is terminated or revoked sooner. When diagnostic testing is negative, the possibility of a false negative result should be considered in the context of a patient's recent exposures and the presence of clinical signs and symptoms consistent with COVID-19. An individual without symptoms of COVID- 19 and who is not shedding SARS-CoV-2 vi rus would expect to have a negative (not detected) result in this assay. Performed At: [facility] [place], [place] [id number] Rush Farmer MD [id number]    [place]  Final    Comment: Performed at [facility] [address] [place], [place] [address]         Radiology Studies: Dg Chest Port 1 View  Result Date: 05/19/2019 CLINICAL DATA:  Intubation and OG placement EXAM: PORTABLE CHEST 1 VIEW COMPARISON:  Chest radiograph 05/19/2019 FINDINGS: Interval placement of an endotracheal tube with tip terminating between the thoracic inlet and carina. Placement of a nasogastric tube with tip extending below the diaphragm beyond the field of view. Stable cardiomediastinal contours. Bilateral pulmonary infiltrates involving the left mid to lower lung and right lower lung suspicious for multifocal infection and/or edema. No pneumothorax. Possible small left pleural effusion. IMPRESSION: 1. Interval placement of endotracheal and nasogastric tubes as above. 2. Bilateral pulmonary infiltrates suspicious for multifocal infection and/or edema. Electronically Signed   By: Audie Pinto M.D.   On:  05/19/2019 19:32   Dg Chest Port 1 View  Result Date: 05/19/2019 CLINICAL DATA:  Respiratory distress EXAM: PORTABLE CHEST 1 VIEW COMPARISON:  Chest radiographs 05/15/2019, 05/13/2019 FINDINGS: Stable cardiomediastinal contours with enlarged heart size. Central venous congestion. Worsening bilateral multifocal airspace opacities. Possible small left pleural effusion. There is no pneumothorax. No acute osseous abnormality in the visualized bones. IMPRESSION: Worsening bilateral multifocal airspace opacities may represent pulmonary edema or multifocal pneumonia. Electronically Signed   By: Audie Pinto M.D.   On: 05/19/2019 17:47   Dg Abd Portable 1v  Result Date: 05/19/2019 CLINICAL DATA:  Intubation.  OG tube placement.  COVID-19 pneumonia. EXAM: PORTABLE ABDOMEN - 1 VIEW COMPARISON:  One-view abdomen 05/13/2019. FINDINGS: The side port of the NG tube is  in the fundus the stomach. Heart size normal. Bilateral lower lobe pneumonia is present. A left pleural effusion is present. IMPRESSION: 1. The side port of the NG tube is in the fundus of the stomach. 2. Bilateral lower lobe airspace disease consistent with known COVID-19 pneumonia. 3. Left pleural effusion. Electronically Signed   By: San Morelle M.D.   On: 05/19/2019 19:42        Scheduled Meds:  aspirin  81 mg Oral Daily   atorvastatin  80 mg Oral q1800   bethanechol  10 mg Oral BID   Chlorhexidine Gluconate Cloth  6 each Topical Daily   clopidogrel  75 mg Oral Daily   collagenase   Topical Daily   feeding supplement (ENSURE ENLIVE)  237 mL Oral TID BM   folic acid  1 mg Oral Daily   gabapentin  100 mg Oral Q8H   heparin  2,000 Units Intravenous Once   insulin aspart  0-20 Units Subcutaneous TID AC & HS   ipratropium-albuterol  3 mL Nebulization Q6H   mouth rinse  15 mL Mouth Rinse BID   metoprolol tartrate  50 mg Oral BID   multivitamin with minerals  1 tablet Oral Daily   mupirocin ointment   Nasal BID    nystatin   Topical TID   senna-docusate  3 tablet Oral BID   sodium chloride flush  10-40 mL Intracatheter Q12H   tamsulosin  0.8 mg Oral QPC supper   Continuous Infusions:  sodium chloride Stopped (05/20/19 0603)   fentaNYL infusion INTRAVENOUS Stopped (05/20/19 0837)   heparin       LOS: 38 days   The patient is critically ill with multiple organ systems failure and requires high complexity decision making for assessment and support, frequent evaluation and titration of therapies, application of advanced monitoring technologies and extensive interpretation of multiple databases. Critical Care Time devoted to patient care services described in this note  Time spent: 40 minutes     Jaspreet Hollings, Geraldo Docker, MD [facility] Pager [phone]  If 7PM-7AM, please contact night-coverage [email or web address] Password [email or web address] 05/20/2019, 2:02 PM

## 2019-05-20 NOTE — Progress Notes (Signed)
ANTICOAGULATION CONSULT NOTE - [facility] for Heparin Indication: chest pain/ACS  Allergies  Allergen Reactions  . Ivp Dye [Iodinated Diagnostic Agents] Itching and Rash    Patient Measurements: Height: 6' (182.9 cm) Weight: (No scale on the bed) IBW/kg (Calculated) : 77.6 Heparin Dosing Weight: 99 kg   Vital Signs: Temp: 98.5 F (36.9 C) (09/08 2000) Temp Source: Oral (09/08 2000) BP: 144/78 (09/08 2000) Pulse Rate: 102 (09/08 1009)  Labs: Recent Labs    05/18/19 0112 05/19/19 1745 05/19/19 1915 05/19/19 1943 05/19/19 2115 05/20/19 0530 05/20/19 1240 05/20/19 2020  HGB 8.3* 10.2*  --  9.5*  --  8.3*  --   --   HCT 25.6* 30.0*  --  28.0*  --  26.2*  --   --   PLT 155  --   --   --   --  245  --   --   APTT  --   --  40*  --   --   --   --   --   LABPROT  --   --  15.4*  --   --   --   --   --   INR  --   --  1.2  --   --   --   --   --   HEPARINUNFRC  --   --   --   --   --  <0.10* 0.19* 0.40  CREATININE 1.40*  --  1.34*  --   --  1.61*  --   --   TROPONINIHS  --   --  46*  --  49*  --   --   --     Estimated Creatinine Clearance: 56.8 mL/min (A) (by C-G formula based on SCr of 1.61 mg/dL (H)).   Medications: Infusions: . sodium chloride Stopped (05/20/19 0603)  . fentaNYL infusion INTRAVENOUS Stopped (05/20/19 0837)  . heparin 2,100 Units/hr (05/20/19 1800)     Assessment: 17 YOM with prolonged COVID-19 associated hospital stay now with recurrent acute hypoxemic respiratory failure, suspicious for cardiac cause. Pharmacy consulted to start IV heparin for ACS.   Of note, previously started on IV heparin 9/2 for same then discontinued 9/3 due to bleeding from endotracheal tube during previous intubation and blood clots in urine (has catheter for retention); conservative medical management recommended by Cardiology.  Aspirin & plavix continued.   Heparin level 0.19, subtherapeutic but increased on heparin at 1900 units/hr  Hgb  low/stable at 8.3; Plts inc to 245k   SCr increased to 1.6  No line or bleeding issues per RN.  Urine is clear, no s/s hematuria.  ETT removed this morning w/o complications.  9:21 PM: Heparin level is therapeutic (0.4), no bleeding or complications reported.  Goal of Therapy:  Heparin level 0.3-0.5 units/ml Monitor platelets by anticoagulation protocol: Yes   Plan:   Continue heparin infusion at 2100 units/hr  Monitor daily heparin level, CBC and s/s of bleeding  Peggyann Juba, PharmD, BCPS Pharmacy: [phone] Clinical pharmacist phone 7am- 5pm: [phone] 05/20/2019 9:21 PM

## 2019-05-20 NOTE — Progress Notes (Signed)
Nutrition Follow-up  DOCUMENTATION CODES:   Obesity unspecified  INTERVENTION:   Recommend place small bore feeding tube with CO2 detector for enteral nutrition support. Even if diet is advanced, doubt intake will be adequate to meet increased nutrition needs for healing and recovery.  Osmolite 1.5 at 60 ml/h with Pro-stat 30 ml QID would provide 2560 kcal, 150 gm protein, 1097 ml free water daily  NUTRITION DIAGNOSIS:   Inadequate oral intake related to poor appetite(loss of taste) as evidenced by per patient/family report(PO intake documented as <25%).  Ongoing   GOAL:   Patient will meet greater than or equal to 90% of their needs  Unmet  MONITOR:   PO intake, Supplement acceptance, Diet advancement, Skin  ASSESSMENT:   68 yo male admitted with ARDS from COVID-19 pneumonia. PMH includes HLD, anxiety, ischemic cardiomyopathy, CAD, COPD, RA, HTN, DM-on insulin.  Patient decompensated and required intubation yesterday. He was extubated earlier today. OGT was placed 9/7, removed with extubation today. Previous Cortrak tube was removed on 8/24 for MBS. PO intake has been minimal for the past two weeks. He has been on and off of dysphagia diets with variable intake, overall < 50% meal completion.  S/P SLP evaluation after extubation today. Patient with coughing after trial of ice chips and teaspoons of honey thick liquids. Remains NPO for now.   Labs reviewed.  CBG's: 81-79 today  Medications reviewed and include folic acid, novolog, MVI, senokot-s, flomax.  Mild edema to BLE documented today. I/O -2.4 L since admission.  Diet Order:   Diet Order            Diet NPO time specified Except for: Other (See Comments)  Diet effective now              EDUCATION NEEDS:   Education needs have been addressed  Skin:  Skin Assessment: Skin Integrity Issues: Skin Integrity Issues:: Stage III DTI: sacrum, buttocks Stage III: buttocks Unstageable: buttocks  Last BM:   9/7  Height:   Ht Readings from Last 1 Encounters:  05/19/19 6' (1.829 m)    Weight:   Wt Readings from Last 1 Encounters:  03/25/19 106.6 kg    Ideal Body Weight:  80.9 kg  BMI:  Body mass index is 31.72 kg/m.  Estimated Nutritional Needs:   Kcal:  2400-2600  Protein:  130-160 grams  Fluid:  2 L    Molli Barrows, RD, LDN, [facility] Pager [phone] After Hours Pager [phone]

## 2019-05-20 NOTE — Procedures (Signed)
Extubation Procedure Note  Patient Details:   Name: Robert [name] DOB: December 31, 1950 MRN: [id number]   Airway Documentation:    Vent end date: 05/20/19 Vent end time: 1000   Evaluation  O2 sats: stable throughout Complications: No apparent complications Patient did tolerate procedure well. Bilateral Breath Sounds: Clear, Diminished   Yes   Positive cuff leak noted.  Patient placed on [place] 5 L with humidity. No stridor note.   [name] 05/20/2019, 10:11 AM

## 2019-05-20 NOTE — Progress Notes (Signed)
Occupational Therapy Treatment Patient Details Name: Robert [name] MRN: [id number] DOB: [birth year] Today's Date: 05/20/2019    History of present illness 68 yo male [place] HTN,/CAD,GERD, DM,RA, T3 compression fx, wrist fx, COPD,ischemic myopathy presented to the hospital and was admitted on 04/11/2019 with shortness of breath, he was found to be significantly hypoxic, tested positive for COVID-19, was intubated and transferred to [facility] for further management. intubated 3x in the course of a month. He was extubated 05/03/2019 and unfortunately developed worsening respiratory failure with increased work of breathing and was transferred back to the ICU. Cardiology consulted with elevated troponins and felt this was due to CHF and hypoxia due to respiratory failure c/w aspiration. 9/7 Pt transferred to ICU and intubed,  with Acute respiratory failure with hypoxemia due to recurrent cardiogenic edema from acute hypertension in setting of diastolic heart failure and CAD. pt extubated 9/8    OT comments  Pt seen for progression of functional activity/mobility, presents supine in bed willing and motivated to work with therapies. Session somewhat limited due to decreased BP with sitting/standing activity (see below). Pt tolerating functional transfers (sit<>stand) with use of Stedy and two person assist. Pt on 5L HFNC with SpO2 overall maintaining >90%. Pt continues to have limitations due to weakness, decreased activity tolerance and impaired cognition including decreased insight into current deficits. Feel he remains appropriate candidate for CIR level therapies at time of discharge. Will continue to follow acutely.  BP start of session: 120/65  Sitting EOB: 114/62  After first sit<>stand (taken in sitting): 100/61  After return to supine: 114/67    Follow Up Recommendations  CIR;Supervision/Assistance - 24 hour    Equipment Recommendations  3 in 1 bedside commode    Recommendations for Other Services  Rehab consult    Precautions / Restrictions Precautions Precautions: Fall Precaution Comments: sacral wound Required Braces or Orthoses: Other Brace(B prevalon boots) Other Brace: B Prevalon boots Restrictions Weight Bearing Restrictions: No       Mobility Bed Mobility Overal bed mobility: Needs Assistance Bed Mobility: Supine to Sit;Sit to Supine     Supine to sit: Mod assist;+2 for physical assistance;+2 for safety/equipment Sit to supine: Mod assist;+2 for physical assistance;+2 for safety/equipment   General bed mobility comments: HOB elevated and use of rail; incr time and cues for technique; assist for LEs and trunk elevation, assist to scoot hips towards EOB  Transfers Overall transfer level: Needs assistance   Transfers: Sit to/from Stand Sit to Stand: +2 physical assistance;+2 safety/equipment;Mod assist         General transfer comment: sit<>stand from EOB and from seat of Stedy    Balance Overall balance assessment: Needs assistance Sitting-balance support: No upper extremity supported;Feet supported Sitting balance-Leahy Scale: Fair     Standing balance support: Bilateral upper extremity supported Standing balance-Leahy Scale: Poor Standing balance comment: while in stedy, able to keep trunk upright with cues to squeeze buttocks                           ADL either performed or assessed with clinical judgement   ADL Overall ADL's : Needs assistance/impaired     Grooming: Minimal assistance;Sitting               Lower Body Dressing: Maximal assistance;Sit to/from stand Lower Body Dressing Details (indicate cue type and reason): assist to adjust socks             Functional mobility during ADLs: +  2 for physical assistance;Moderate assistance;Maximal assistance(using Stedy) General ADL Comments: generalized weakness affecting ADL     Vision       Perception     Praxis      Cognition Arousal/Alertness:  Awake/alert Behavior During Therapy: WFL for tasks assessed/performed Overall Cognitive Status: Impaired/Different from baseline Area of Impairment: Attention;Memory;Safety/judgement;Awareness;Problem solving                   Current Attention Level: Selective Memory: Decreased short-term memory;Decreased recall of precautions   Safety/Judgement: Decreased awareness of safety;Decreased awareness of deficits Awareness: Emergent Problem Solving: Slow processing;Requires verbal cues;Requires tactile cues General Comments: pt able to recall he is in the hospital, educated of name of hospital and pt able to recall end of session. pt does require education/cues for safety and of his current deficits        Exercises     Shoulder Instructions       General Comments      Pertinent Vitals/ Pain       Pain Assessment: No/denies pain  Home Living                                          Prior Functioning/Environment              Frequency  Min 3X/week        Progress Toward Goals  OT Goals(current goals can now be found in the care plan section)  Progress towards OT goals: Progressing toward goals  Acute Rehab OT Goals Patient Stated Goal: to get stronger OT Goal Formulation: With patient Time For Goal Achievement: 05/23/19 Potential to Achieve Goals: Good ADL Goals Pt Will Perform Eating: with set-up;with supervision;sitting;with adaptive utensils Pt Will Perform Grooming: with set-up;with supervision;sitting Pt Will Perform Upper Body Bathing: with set-up;with supervision;sitting Pt/caregiver will Perform Home Exercise Program: Increased strength;Both right and left upper extremity;With theraband;With written HEP provided;With minimal assist Additional ADL Goal #1: Pt will complete sit - stand with Mod A +2 in preparation for toilet transfers Additional ADL Goal #2: Pt will participate in 15 minute ADL task with SpO2 remaining above 90.   Plan Discharge plan remains appropriate    Co-evaluation    PT/OT/SLP Co-Evaluation/Treatment: Yes Reason for Co-Treatment: Complexity of the patient's impairments (multi-system involvement);For patient/therapist safety   OT goals addressed during session: ADL's and self-care      AM-PAC OT "6 Clicks" Daily Activity     Outcome Measure   Help from another person eating meals?: A Little Help from another person taking care of personal grooming?: A Little Help from another person toileting, which includes using toliet, bedpan, or urinal?: A Lot Help from another person bathing (including washing, rinsing, drying)?: A Lot Help from another person to put on and taking off regular upper body clothing?: A Lot Help from another person to put on and taking off regular lower body clothing?: A Lot 6 Click Score: 14    End of Session Equipment Utilized During Treatment: Oxygen;Gait belt  OT Visit Diagnosis: Other abnormalities of gait and mobility (R26.89);Muscle weakness (generalized) (M62.81);Other symptoms and signs involving cognitive function   Activity Tolerance Patient tolerated treatment well   Patient Left with call bell/phone within reach;in bed;with restraints reapplied   Nurse Communication Mobility status        Time: [id number] OT Time Calculation (min): 31 min  Charges: OT General Charges $  OT Visit: 1 Visit OT Treatments $Therapeutic Activity: 8-22 mins  Lou Cal, OT [facility] Pager [phone] Office [phone]    Robert [name] 05/20/2019, 4:46 PM

## 2019-05-20 NOTE — Plan of Care (Signed)
Pt has been extremely agitated and required application of soft wrist restraints. Continuous Fentanyl gtt was increased but cautiously because of hypotension.  Explained to the pt that he had to keep his arms straight so that the IV pump could deliver the meds to him intravenously. The pt has complaints of itching and it seems to be worse in the face. Reported the problem to the [facility] representative/RN who reported the issue to the oncall MD, Dr. Oletta Darter. Received order for Benadryl.

## 2019-05-20 NOTE — Progress Notes (Signed)
NAME:  Robert [name], MRN:  [id number], DOB:  May 03, 1951, LOS: 35 ADMISSION DATE:  04/11/2019, CONSULTATION DATE:  8/1 REFERRING MD:  Roderic Palau, CHIEF COMPLAINT:  dyspnea   Brief History   68 y/o male with COPD admitted with ARDS from [place] [facility] on 8/1, eventually extubated 8/22.  Returned to the ICU with aspiration and an MI on 9/1, recovered, but the developed respiratory failure again on 9/7 in the setting of hypertension and atrial fibrillation with RVR.    Past Medical History  Anxiety COPD GERD CAD Hypertension Hyperlipidemia Rheumatoid arthritis History of systolic heart failure, however 08/2018 Echo showed LVEF 50-55%, left atrium   Significant Hospital Events   8/1 admission to [facility] ICU 8/2 extubated 8/3 early AM agitated, placed on BIPAP, required re-intubation 8/4 started on steroids for COPD exacerbation 8/8 septic shock requiring pressor support 8/9 off pressors, respiratory cultures +s.aureus 8/10 persistent encephalopathy off sedation, CT head ordered 8/11 encephalopathy improved 8/12 difficulty weaning due to tachypnea, ETT exchanged due to dried blood clot burden 8/13 Bleeding fromsubclavian CVL. Exchanged for PICC line 8/17 started weaning on PSV 14/5 8/22 extubated  9/1 back to ICU after aspiration event, elevated troponin  9/7 back to ICU with hypoxemic respiratory failure, intubated  Consults:  PCCM  Procedures:  8/1 ETT >8/2, 8/3 >8/22 8/1 L subclavian CVL >8/13 8/13 Rt PICC ETT 9/7 >>   Significant Diagnostic Tests:  08/2018 Echo> LVEF 50-55%, left atrium dilated  CT head 8/11-mild age-related atrophy and chronic microvascular ischemic changes. Old lacunar infarct.  Micro Data:  7/31 blood >NGTD 7/31 sars-cov-2 >positive 8/1 Resp CX > Normal flora 8/7 Resp CX >MRSA 8/7 BCX > NGTD 8/8 UCX > NGTD 8/15 Resp culture>MRSA   Antimicrobials/COVID Treatment:  Cefepime 7/31 >8/4  Flagyl 7/31  Remdesivir 8/1 >8/5  Actemra 8/1   Decadron/solumedrol 8/1 >8/8  Zosyn 8/8>8/9 Azithro 8/7>8/9  Cefepime 8/14 >off Vanc 7/31, 8/8>8/22  Unasyn 9/1 > completed  Interim history/subjective:   Patient was intubated last night By report, he was stable at 1600, but by 1730 he was dyspneic, hypoxemic, and diaphoretic.  He was minimally responsive, hypertensive.  In atrial fib but not in afib with RVR.  He was moved to the ICU and intubated.  The initial concern overnight was that he was having a primary cardiac event.  CXR showed bilateral airspace disease. Diuresed some overnight. This morning he hs feeling better, following commands on the vent, has been passing an SBT for over an hour, oxygen saturation normal.   Objective   Blood pressure 92/61, pulse 87, temperature 98.6 F (37 C), temperature source Axillary, resp. rate 20, height 6' (1.829 m), weight 106.1 kg, SpO2 93 %.    Vent Mode: PRVC FiO2 (%):  [50 %-100 %] 50 % Set Rate:  [20 bmp] 20 bmp Vt Set:  [620 mL] 620 mL PEEP:  [5 cmH20] 5 cmH20 Plateau Pressure:  [17 cmH20-24 cmH20] 17 cmH20   Intake/Output Summary (Last 24 hours) at 05/20/2019 0728 Last data filed at 05/20/2019 [id number] Gross per 24 hour  Intake 527.18 ml  Output 710 ml  Net -182.82 ml   Filed Weights   05/06/19 0500 05/07/19 0235  Weight: 111 kg 106.1 kg    Examination:  General:  In bed on vent HENT: NCAT ETT in place PULM: CTA B, vent supported breathing CV: RRR, no mgr GI: BS+, soft, nontender MSK: normal bulk and tone Neuro: drowsy on vent, but able to wake up, follow commands  9/7 CXR shows bilateral airspace disease, batwing pattern   Resolved Hospital Problem list     Assessment & Plan:  Acute respiratory failure with hypoxemia due to recurrent cardiogenic edema from acute hypertension in setting of diastolic heart failure and CAD: most likely explanation for last night's episode Monitor I/O closely Extubate this morning, monitor respiratory status closely Aspiration  precautions Continue metoprolol Consider adding low dose nitrate if pressure tolerates Would benefit from cardiology consultation  DDx includes primary neuro event, tia? Seems less likely but reasonable to check given atrial fib > neuro exam after extubation > head CT today > MRI brain tomorrow  Atrial fib Tele Metoprolol Heparin  Mild AKI Monitor renal function, UOP  COVID resolved Off airborne precautions  COPD: not in exacerbation Prn bronchodilators  Best practice:  Diet: NPO after extubation, advance if neuro exam intact Pain/Anxiety/Delirium protocol (if indicated): d/c today VAP protocol (if indicated): yes DVT prophylaxis: heparin infusion GI prophylaxis: n/a post exacerbation Glucose control: SSI Mobility: bed rest today Code Status: Full Family Communication: will clarify with TRH Disposition: remain in ICU  Labs   CBC: Recent Labs  Lab 05/13/19 2220  05/15/19 0610 05/16/19 0139 05/17/19 0114 05/18/19 0112 05/19/19 1745 05/19/19 1943 05/20/19 0530  WBC 10.2   < > 6.5 6.0 5.9 5.9  --   --  7.4  NEUTROABS 7.4  --   --   --   --   --   --   --  5.4  HGB 10.7*   < > 8.5* 8.0* 8.3* 8.3* 10.2* 9.5* 8.3*  HCT 34.1*   < > 26.5* 25.1* 26.2* 25.6* 30.0* 28.0* 26.2*  MCV 95.8   < > 93.6 94.7 93.9 94.8  --   --  93.9  PLT 175   < > 147* 149* 159 155  --   --  245   < > = values in this interval not displayed.    Basic Metabolic Panel: Recent Labs  Lab 05/16/19 0139 05/17/19 0114 05/18/19 0112 05/19/19 0405 05/19/19 1745 05/19/19 1915 05/19/19 1943 05/20/19 0530  NA 137 137 138  --  136 136 136 137  K 3.5 4.3 3.9  --  4.1 4.3 4.6 4.2  CL 97* 95* 95*  --   --  96*  --  96*  CO2 30 32 33*  --   --  28  --  28  GLUCOSE 191* 175* 125*  --   --  266*  --  87  BUN 23 25* 22  --   --  23  --  25*  CREATININE 1.34* 1.52* 1.40*  --   --  1.34*  --  1.61*  CALCIUM 8.6* 8.5* 8.5*  --   --  9.1  --  9.1  MG 1.7 1.7 1.7 2.0  --  1.9  --  2.2  PHOS  --   --    --   --   --   --   --  4.6   GFR: Estimated Creatinine Clearance: 56.8 mL/min (A) (by C-G formula based on SCr of 1.61 mg/dL (H)). Recent Labs  Lab 05/13/19 2220 05/14/19 0801  05/15/19 0610 05/16/19 0139 05/17/19 0114 05/18/19 0112 05/20/19 0530  PROCALCITON  --  0.49  --  0.70 0.53  --   --   --   WBC 10.2 6.7   < > 6.5 6.0 5.9 5.9 7.4  LATICACIDVEN 1.5  --   --   --   --   --   --   --    < > =  values in this interval not displayed.    Liver Function Tests: Recent Labs  Lab 05/16/19 0139 05/17/19 0114 05/18/19 0112 05/19/19 1915 05/20/19 0530  AST 31 37 44* 37 27  ALT 58* 68* 79* 80* 68*  ALKPHOS 132* 144* 131* 138* 120  BILITOT 0.2* 0.5 0.2* 0.6 0.3  PROT 6.3* 6.6 6.4* 7.3 7.0  ALBUMIN 2.0* 2.2* 2.1* 2.5* 2.3*   No results for input(s): LIPASE, AMYLASE in the last 168 hours. No results for input(s): AMMONIA in the last 168 hours.  ABG    Component Value Date/Time   PHART 7.403 05/19/2019 1943   PCO2ART 52.4 (H) 05/19/2019 1943   PO2ART 390.0 (H) 05/19/2019 1943   HCO3 32.8 (H) 05/19/2019 1943   TCO2 34 (H) 05/19/2019 1943   ACIDBASEDEF 1.0 04/24/2019 0424   O2SAT 100.0 05/19/2019 1943     Coagulation Profile: Recent Labs  Lab 05/19/19 1915  INR 1.2    Cardiac Enzymes: No results for input(s): CKTOTAL, CKMB, CKMBINDEX, TROPONINI in the last 168 hours.  HbA1C: Hgb A1c MFr Bld  Date/Time Value Ref Range Status  04/11/2019 09:50 PM 7.4 (H) 4.8 - 5.6 % Final    Comment:    (NOTE) Pre diabetes:          5.7%-6.4% Diabetes:              >6.4% Glycemic control for   <7.0% adults with diabetes   06/07/2017 12:02 AM 9.1 (H) 4.8 - 5.6 % Final    Comment:    (NOTE) Pre diabetes:          5.7%-6.4% Diabetes:              >6.4% Glycemic control for   <7.0% adults with diabetes     CBG: Recent Labs  Lab 05/18/19 1716 05/18/19 2025 05/19/19 0815 05/19/19 1118 05/19/19 2152  GLUCAP 231* 202* 172* 162* 281*       Critical care time: 40  minutes    Roselie Awkward, MD [facility] Pager: [phone] Cell: [phone] If no response, call [phone]

## 2019-05-20 NOTE — Progress Notes (Signed)
Wasted 250 mL bag of fentanyl with William Hamburger RN in [employer]

## 2019-05-20 NOTE — Progress Notes (Signed)
Physical Therapy Treatment Patient Details Name: Robert [name] MRN: [id number] DOB: Feb 16, 1951 Today's Date: 05/20/2019    History of Present Illness 68 yo male with PMH HTN,/CAD,GERD, DM,RA, T3 compression fx, wrist fx, COPD,ischemic myopathy presented to the hospital and was admitted on 04/11/2019 with shortness of breath, he was found to be significantly hypoxic, tested positive for COVID-19, was intubated and transferred to [facility] for further management. intubated 3x in the course of a month. He was extubated 05/03/2019 and unfortunately developed worsening respiratory failure with increased work of breathing and was transferred back to the ICU. Cardiology consulted with elevated troponins and felt this was due to CHF and hypoxia due to respiratory failure c/w aspiration. 9/7 Pt transferred to ICU and intubated, with Acute respiratory failure with hypoxemia due to recurrent cardiogenic edema from acute hypertension in setting of diastolic heart failure and CAD. pt extubated 9/8     PT Comments    Overall, pt mobilizing as well as he did when last seen (before return to ICU), however cognition more impaired and BP dropped in standing. (supine 120/65, sit 114/62, stand 100/61). SaO2 >90% throughout session on 5L HFNC. Patient inconsistent and not reliable re: whether he was experiencing dizziness with drop in BP. Patient not placed in recliner due to confusion and incr fall risk and RN appreciative.     Follow Up Recommendations  CIR     Equipment Recommendations  Other (comment)(tba)    Recommendations for Other Services       Precautions / Restrictions Precautions Precautions: Fall Precaution Comments: sacral wound Required Braces or Orthoses: Other Brace(B prevalon boots) Other Brace: B Prevalon boots Restrictions Weight Bearing Restrictions: No    Mobility  Bed Mobility Overal bed mobility: Needs Assistance Bed Mobility: Supine to Sit;Sit to Supine     Supine to sit: Mod  assist;+2 for physical assistance;+2 for safety/equipment Sit to supine: Mod assist;+2 for physical assistance;+2 for safety/equipment   General bed mobility comments: HOB elevated and use of rail; incr time and cues for technique; assist for LEs and trunk elevation, assist to scoot hips towards EOB  Transfers Overall transfer level: Needs assistance   Transfers: Sit to/from Stand Sit to Stand: +2 physical assistance;+2 safety/equipment;Mod assist;From elevated surface         General transfer comment: sit<>stand from EOB and from seat of Stedy  Ambulation/Gait                 Stairs             Wheelchair Mobility    Modified Rankin (Stroke Patients Only)       Balance Overall balance assessment: Needs assistance Sitting-balance support: No upper extremity supported;Feet supported Sitting balance-Leahy Scale: Fair Sitting balance - Comments: able to reposition self in recliner; S with anterior/posteiror weight shifts and lateral weight shifts   Standing balance support: Bilateral upper extremity supported Standing balance-Leahy Scale: Poor Standing balance comment: while in stedy, able to keep trunk upright with cues to squeeze buttocks                            Cognition Arousal/Alertness: Awake/alert Behavior During Therapy: WFL for tasks assessed/performed Overall Cognitive Status: Impaired/Different from baseline Area of Impairment: Attention;Memory;Safety/judgement;Awareness;Problem solving                   Current Attention Level: Selective Memory: Decreased short-term memory;Decreased recall of precautions   Safety/Judgement: Decreased awareness of safety;Decreased awareness  of deficits Awareness: Emergent Problem Solving: Slow processing;Requires verbal cues;Requires tactile cues General Comments: pt able to recall he is in the hospital, educated of name of hospital and pt able to recall end of session. pt does require  education/cues for safety and of his current deficits      Exercises      General Comments        Pertinent Vitals/Pain Pain Assessment: No/denies pain    Home Living                      Prior Function            PT Goals (current goals can now be found in the care plan section) Acute Rehab PT Goals Patient Stated Goal: to get stronger Time For Goal Achievement: 05/30/19 Potential to Achieve Goals: Fair Progress towards PT goals: Not progressing toward goals - comment(another medical set-back; goals still approp)    Frequency    Min 3X/week      PT Plan Current plan remains appropriate;Frequency needs to be updated    Co-evaluation PT/OT/SLP Co-Evaluation/Treatment: Yes Reason for Co-Treatment: Complexity of the patient's impairments (multi-system involvement);For patient/therapist safety PT goals addressed during session: Mobility/safety with mobility;Strengthening/ROM OT goals addressed during session: ADL's and self-care      AM-PAC PT "6 Clicks" Mobility   Outcome Measure  Help needed turning from your back to your side while in a flat bed without using bedrails?: A Little Help needed moving from lying on your back to sitting on the side of a flat bed without using bedrails?: A Little Help needed moving to and from a bed to a chair (including a wheelchair)?: A Lot Help needed standing up from a chair using your arms (e.g., wheelchair or bedside chair)?: A Lot Help needed to walk in hospital room?: Total Help needed climbing 3-5 steps with a railing? : Total 6 Click Score: 12    End of Session Equipment Utilized During Treatment: Oxygen(5L HFNC) Activity Tolerance: Patient tolerated treatment well Patient left: with nursing/sitter in room;in bed(in ICU) Nurse Communication: Mobility status PT Visit Diagnosis: Muscle weakness (generalized) (M62.81)     Time: [id number] PT Time Calculation (min) (ACUTE ONLY): 31 min  Charges:  $Therapeutic  Activity: 8-22 mins                       Barry Brunner, PT       Rexanne Mano 05/20/2019, 4:58 PM

## 2019-05-20 NOTE — Evaluation (Signed)
Clinical/Bedside Swallow Evaluation Patient Details  Name: Robert [name] MRN: [id number] Date of Birth: 14-Dec-1950  Today's Date: 05/20/2019 Time: SLP Start Time (ACUTE ONLY): 1330 SLP Stop Time (ACUTE ONLY): 1404 SLP Time Calculation (min) (ACUTE ONLY): 34 min  Past Medical History:  Past Medical History:  Diagnosis Date  . Anxiety   . COPD (chronic obstructive pulmonary disease) ([place])   . Coronary artery disease   . Full dentures   . GERD (gastroesophageal reflux disease)    Rolaids as needed  . High cholesterol   . History of MI (myocardial infarction)   . Hypertension    med. dosage increased 04/2016; has been on BP med. x 10 yrs.  . Incarcerated umbilical hernia 0000000  . Inguinal hernia 05/2016   bilateral   . Insulin dependent diabetes mellitus ([place])   . Ischemic cardiomyopathy    a. 03/2015 EF 40-45% by LV gram.  . Left leg cellulitis 10/08/2016  . Rheumatoid arthritis(714.0)    Past Surgical History:  Past Surgical History:  Procedure Laterality Date  . CARDIAC CATHETERIZATION N/A 02/01/2015   Procedure: Left Heart Cath and Coronary Angiography;  Surgeon: Belva Crome, MD;  Location: [place] [facility];  Service: Cardiovascular;  Laterality: N/A;  . CARDIAC CATHETERIZATION N/A 03/22/2015   Procedure: Left Heart Cath and Coronary Angiography;  Surgeon: Leonie Man, MD;  Location: [place] [facility];  Service: Cardiovascular;  Laterality: N/A;  . CARDIAC CATHETERIZATION  09/05/2002; 03/09/2003; 04/10/2005;06/02/2005; 05/09/2006; 02/09/2007; 03/01/2007  . COLONOSCOPY  2008   Dr. Gala Romney: diverticulosis, hyperplastic polyp.  . CORONARY ANGIOPLASTY  11/14/2012; 02/01/2015  . CORONARY ANGIOPLASTY WITH STENT PLACEMENT  05/16/2012   "1; makes total ~ 4"  . CORONARY STENT INTERVENTION N/A 06/06/2017   Procedure: CORONARY STENT INTERVENTION;  Surgeon: Belva Crome, MD;  Location: [place] [facility];  Service: Cardiovascular;  Laterality: N/A;  . INSERTION OF MESH Bilateral 05/24/2016    Procedure: INSERTION OF MESH;  Surgeon: Coralie Keens, MD;  Location: [place];  Service: General;  Laterality: Bilateral;  . LAPAROSCOPIC CHOLECYSTECTOMY    . LAPAROSCOPIC INGUINAL HERNIA WITH UMBILICAL HERNIA Bilateral 05/24/2016   Procedure: BILATERAL LAPAROSCOPIC INGUINAL HERNIA REPAIR WITH MESH AND UMBILICAL HERNIA REPAIR WITH MESH;  Surgeon: Coralie Keens, MD;  Location: [place];  Service: General;  Laterality: Bilateral;  . LEFT HEART CATH AND CORONARY ANGIOGRAPHY N/A 06/06/2017   Procedure: LEFT HEART CATH AND CORONARY ANGIOGRAPHY;  Surgeon: Belva Crome, MD;  Location: [place] [facility];  Service: Cardiovascular;  Laterality: N/A;  . LEFT HEART CATHETERIZATION WITH CORONARY ANGIOGRAM N/A 12/22/2011   Procedure: LEFT HEART CATHETERIZATION WITH CORONARY ANGIOGRAM;  Surgeon: Troy Sine, MD;  Location: [facility] CATH LAB;  Service: Cardiovascular;  Laterality: N/A;  . LEFT HEART CATHETERIZATION WITH CORONARY ANGIOGRAM Bilateral 05/16/2012   Procedure: LEFT HEART CATHETERIZATION WITH CORONARY ANGIOGRAM;  Surgeon: Lorretta Harp, MD;  Location: [facility] C[facility] LAB;  Service: Cardiovascular;  Laterality: Bilateral;  . LEFT HEART CATHETERIZATION WITH CORONARY ANGIOGRAM N/A 11/14/2012   Procedure: LEFT HEART CATHETERIZATION WITH CORONARY ANGIOGRAM;  Surgeon: Troy Sine, MD;  Location: [facility] CATH LAB;  Service: Cardiovascular;  Laterality: N/A;  . LEFT HEART CATHETERIZATION WITH CORONARY ANGIOGRAM N/A 09/01/2013   Procedure: LEFT HEART CATHETERIZATION WITH CORONARY ANGIOGRAM;  Surgeon: Leonie Man, MD;  Location: [facility] CATH LAB;  Service: Cardiovascular;  Laterality: N/A;  . [place]; 1973; 1985   x 3  . NM [facility]  PERF WALL MOTION  08/30/2009   No significant ischemia  . OPEN REDUCTION INTERNAL FIXATION (ORIF) DISTAL RADIAL FRACTURE Right 12/10/2016   Procedure: OPEN REDUCTION INTERNAL FIXATION (ORIF) DISTAL RADIAL FRACTURE;  Surgeon: Iran Planas, MD;   Location: [place];  Service: Orthopedics;  Laterality: Right;  . PERCUTANEOUS CORONARY INTERVENTION-BALLOON ONLY  12/22/2011   Procedure: PERCUTANEOUS CORONARY INTERVENTION-BALLOON ONLY;  Surgeon: Troy Sine, MD;  Location: [facility] CATH LAB;  Service: Cardiovascular;;  . PERCUTANEOUS CORONARY INTERVENTION-BALLOON ONLY  11/14/2012   Procedure: PERCUTANEOUS CORONARY INTERVENTION-BALLOON ONLY;  Surgeon: Troy Sine, MD;  Location: [facility] CATH LAB;  Service: Cardiovascular;;  . PERCUTANEOUS CORONARY STENT INTERVENTION (PCI-S)  05/16/2012   Procedure: PERCUTANEOUS CORONARY STENT INTERVENTION (PCI-S);  Surgeon: Lorretta Harp, MD;  Location: [facility] CATH LAB;  Service: Cardiovascular;;  . PERCUTANEOUS CORONARY STENT INTERVENTION (PCI-S)  09/01/2013   Procedure: PERCUTANEOUS CORONARY STENT INTERVENTION (PCI-S);  Surgeon: Leonie Man, MD;  Location: [facility] CATH LAB;  Service: Cardiovascular;;   HPI:  68 year old male with history of COPD, CAD, diabetes mellitus, ischemic cardiomyopathy, RA, presented to the hospital and was admitted on 04/11/2019 with shortness of breath, he was found to be significantly hypoxic, tested positive for COVID-19, was intubated and transferred to [facility] for further management. Intubated 8/1-8/2, reintubated 8/3-8/22.   Had MBS after extubationk,s tarted on Dys 3, nect thick liquids. Had an aspiration event and cardiac event on 9/1 and went back to the ICU. Restarted on puree and honey thick liquids via teaspoon which he toelrated for another week. on 9/7 he was again found hypoxemic with elevated BP. Cardiac event again suspected, but CXR not convincing for new aspiration per MD and pt not eating at the time also per MD. pt was intubated for less than 24 hours.    Assessment / Plan / Recommendation Clinical Impression  Pt function appears similar, just slightly weaker, to observation yesterday on 9/7 prior to distress event. Oral mech still WNL, swallow response present. Moderate dysphonia still  noted though vocal quality a little softer (pt reports he was dysphonic prior to admit). He is also slightly more confused and restless. Trial of ice chips resulted in immediate coughing, as did teaspoons of honey thick liquids, which pt was previously tolerating. Pt was only able to swallow pudding/puree texture without immediate signs of aspiration. Recommend pudding and purees today, meds whole in pudding texture. Will f/u for diet advancement, would really like an instrumental test at this point, particularly a FEES, though it is not currently available in this setting. Hope for transfer to [facility] for better access to a range of services.  SLP Visit Diagnosis: Dysphagia, oropharyngeal phase (R13.12)    Aspiration Risk  Severe aspiration risk    Diet Recommendation NPO except meds   Medication Administration: Whole meds with puree Supervision: Full supervision/cueing for compensatory strategies Compensations: Slow rate;Small sips/bites Postural Changes: Seated upright at 90 degrees    Other  Recommendations     Follow up Recommendations Inpatient Rehab      Frequency and Duration min 2x/week  2 weeks       Prognosis Prognosis for Safe Diet Advancement: Good      Swallow Study   General HPI: 68 year old male with history of COPD, CAD, diabetes mellitus, ischemic cardiomyopathy, RA, presented to the hospital and was admitted on 04/11/2019 with shortness of breath, he was found to be significantly hypoxic, tested positive for COVID-19, was intubated and transferred to [facility] for further management. Intubated 8/1-8/2, reintubated 8/3-8/22.  Had MBS after extubationk,s tarted on Dys 3, nect thick liquids. Had an aspiration event and cardiac event on 9/1 and went back to the ICU. Restarted on puree and honey thick liquids via teaspoon which he toelrated for another week. on 9/7 he was again found hypoxemic with elevated BP. Cardiac event again suspected, but CXR not convincing for new aspiration  per MD and pt not eating at the time also per MD. pt was intubated for less than 24 hours.  Type of Study: Bedside Swallow Evaluation Diet Prior to this Study: NPO Temperature Spikes Noted: No Respiratory Status: Nasal cannula History of Recent Intubation: Yes Length of Intubations (days): (22 days, one day) Date extubated: 05/20/19 Behavior/Cognition: Alert;Cooperative;Confused Oral Cavity Assessment: Within Functional Limits Oral Care Completed by SLP: Recent completion by staff Oral Cavity - Dentition: Edentulous Vision: Functional for self-feeding Self-Feeding Abilities: Needs assist;Total assist(wants to be fed) Patient Positioning: Upright in bed Baseline Vocal Quality: Hoarse;Low vocal intensity Volitional Cough: Strong Volitional Swallow: Able to elicit    Oral/Motor/Sensory Function Overall Oral Motor/Sensory Function: Within functional limits   Ice Chips Ice chips: Impaired Presentation: Spoon Pharyngeal Phase Impairments: Cough - Immediate   Thin Liquid Thin Liquid: Not tested    Nectar Thick Nectar Thick Liquid: Not tested   Honey Thick Honey Thick Liquid: Impaired Presentation: Spoon Pharyngeal Phase Impairments: Cough - Immediate   Puree Puree: Within functional limits   Solid     Solid: Not tested     [name], MA [place] Pager [phone] Office [phone]  [name] 05/20/2019,2:05 PM

## 2019-05-20 NOTE — Progress Notes (Signed)
ANTICOAGULATION CONSULT NOTE - [place] for Heparin Indication: chest pain/ACS  Allergies  Allergen Reactions  . Ivp Dye [Iodinated Diagnostic Agents] Itching and Rash    Patient Measurements: Height: 6' (182.9 cm) Weight: (No scale on the bed) IBW/kg (Calculated) : 77.6 Heparin Dosing Weight: 99 kg   Vital Signs: Temp: 99.3 F (37.4 C) (09/08 0800) Temp Source: Oral (09/08 0800) BP: 120/64 (09/08 1100) Pulse Rate: 102 (09/08 1009)  Labs: Recent Labs    05/18/19 0112 05/19/19 1745 05/19/19 1915 05/19/19 1943 05/19/19 2115 05/20/19 0530  HGB 8.3* 10.2*  --  9.5*  --  8.3*  HCT 25.6* 30.0*  --  28.0*  --  26.2*  PLT 155  --   --   --   --  245  APTT  --   --  Robert*  --   --   --   LABPROT  --   --  15.4*  --   --   --   INR  --   --  1.2  --   --   --   HEPARINUNFRC  --   --   --   --   --  <0.10*  CREATININE 1.Robert*  --  1.34*  --   --  1.61*  TROPONINIHS  --   --  46*  --  49*  --     Estimated Creatinine Clearance: 56.8 mL/min (A) (by C-G formula based on SCr of 1.61 mg/dL (H)).   Medications: Infusions: . sodium chloride Stopped (05/20/19 0603)  . fentaNYL infusion INTRAVENOUS Stopped (05/20/19 0837)  . heparin 1,900 Units/hr (05/20/19 1041)     Assessment: Robert [name] with prolonged COVID-19 associated hospital stay now with recurrent acute hypoxemic respiratory failure, suspicious for cardiac cause. Pharmacy consulted to start IV heparin for ACS.   Of note, previously started on IV heparin 9/2 for same then discontinued 9/3 due to bleeding from endotracheal tube during previous intubation and blood clots in urine (has catheter for retention); conservative medical management recommended by Cardiology.  Aspirin & plavix continued.   Heparin level 0.19, subtherapeutic but increased on heparin at 1900 units/hr  Hgb low/stable at 8.3; Plts inc to 245k   SCr increased to 1.6  No line or bleeding issues per RN.  Urine is clear, no s/s  hematuria.  ETT removed this morning w/o complications.  Goal of Therapy:  Heparin level 0.3-0.5 units/ml Monitor platelets by anticoagulation protocol: Yes   Plan:   Reduced dose heparin bolus 2000 units IV x 1   Increase heparin infusion 2100 units/hr  Heparin level in 6 hrs  Monitor daily heparin level, CBC and s/s of bleeding   Gretta Arab PharmD, BCPS Clinical pharmacist phone 7am- 5pm: [phone] 05/20/2019 1:25 PM

## 2019-05-21 ENCOUNTER — Telehealth: Payer: Self-pay | Admitting: Emergency Medicine

## 2019-05-21 DIAGNOSIS — I5043 Acute on chronic combined systolic (congestive) and diastolic (congestive) heart failure: Secondary | ICD-10-CM

## 2019-05-21 LAB — COMPREHENSIVE METABOLIC PANEL
ALT: 63 U/L — ABNORMAL HIGH (ref 0–44)
AST: 36 U/L (ref 15–41)
Albumin: 2.3 g/dL — ABNORMAL LOW (ref 3.5–5.0)
Alkaline Phosphatase: 111 U/L (ref 38–126)
Anion gap: 11 (ref 5–15)
BUN: 26 mg/dL — ABNORMAL HIGH (ref 8–23)
CO2: 29 mmol/L (ref 22–32)
Calcium: 9 mg/dL (ref 8.9–10.3)
Chloride: 100 mmol/L (ref 98–111)
Creatinine, Ser: 1.59 mg/dL — ABNORMAL HIGH (ref 0.61–1.24)
GFR calc Af Amer: 52 mL/min — ABNORMAL LOW (ref >60)
GFR calc non Af Amer: 45 mL/min — ABNORMAL LOW (ref >60)
Glucose, Bld: 85 mg/dL (ref 70–99)
Potassium: 4.3 mmol/L (ref 3.5–5.1)
Sodium: 140 mmol/L (ref 135–145)
Total Bilirubin: 0.5 mg/dL (ref 0.3–1.2)
Total Protein: 6.7 g/dL (ref 6.5–8.1)

## 2019-05-21 LAB — CBC WITH DIFFERENTIAL/PLATELET
Abs Immature Granulocytes: 0.05 10*3/uL (ref 0.00–0.07)
Basophils Absolute: 0 10*3/uL (ref 0.0–0.1)
Basophils Relative: 0 %
Eosinophils Absolute: 0.3 10*3/uL (ref 0.0–0.5)
Eosinophils Relative: 4 %
HCT: 27.2 % — ABNORMAL LOW (ref 39.0–52.0)
Hemoglobin: 8.5 g/dL — ABNORMAL LOW (ref 13.0–17.0)
Immature Granulocytes: 1 %
Lymphocytes Relative: 17 %
Lymphs Abs: 1.3 10*3/uL (ref 0.7–4.0)
MCH: 29.6 pg (ref 26.0–34.0)
MCHC: 31.3 g/dL (ref 30.0–36.0)
MCV: 94.8 fL (ref 80.0–100.0)
Monocytes Absolute: 0.5 10*3/uL (ref 0.1–1.0)
Monocytes Relative: 7 %
Neutro Abs: 5.1 10*3/uL (ref 1.7–7.7)
Neutrophils Relative %: 71 %
Platelets: 210 10*3/uL (ref 150–400)
RBC: 2.87 MIL/uL — ABNORMAL LOW (ref 4.22–5.81)
RDW: 15.9 % — ABNORMAL HIGH (ref 11.5–15.5)
WBC: 7.2 10*3/uL (ref 4.0–10.5)
nRBC: 0 % (ref 0.0–0.2)

## 2019-05-21 LAB — HEPARIN LEVEL (UNFRACTIONATED): Heparin Unfractionated: 0.49 IU/mL (ref 0.30–0.70)

## 2019-05-21 LAB — MAGNESIUM: Magnesium: 2.2 mg/dL (ref 1.7–2.4)

## 2019-05-21 LAB — GLUCOSE, CAPILLARY
Glucose-Capillary: 126 mg/dL — ABNORMAL HIGH (ref 70–99)
Glucose-Capillary: 180 mg/dL — ABNORMAL HIGH (ref 70–99)
Glucose-Capillary: 239 mg/dL — ABNORMAL HIGH (ref 70–99)
Glucose-Capillary: 302 mg/dL — ABNORMAL HIGH (ref 70–99)

## 2019-05-21 LAB — PHOSPHORUS: Phosphorus: 5.1 mg/dL — ABNORMAL HIGH (ref 2.5–4.6)

## 2019-05-21 MED ORDER — FUROSEMIDE 40 MG PO TABS: 80.0000 mg | ORAL_TABLET | Freq: Every day | ORAL | Status: DC

## 2019-05-21 MED ORDER — INSULIN ASPART 100 UNIT/ML ~~LOC~~ SOLN
0.0000 [IU] | Freq: Every day | SUBCUTANEOUS | Status: DC
  Administered 2019-05-21: 4 [IU] via SUBCUTANEOUS

## 2019-05-21 MED ORDER — INSULIN ASPART 100 UNIT/ML ~~LOC~~ SOLN
3.0000 [IU] | Freq: Three times a day (TID) | SUBCUTANEOUS | Status: DC
  Administered 2019-05-21 – 2019-05-22 (×5): 3 [IU] via SUBCUTANEOUS

## 2019-05-21 MED ORDER — ALPRAZOLAM 0.5 MG PO TABS: 0.5000 mg | ORAL_TABLET | Freq: Three times a day (TID) | ORAL | Status: DC | PRN

## 2019-05-21 MED ORDER — FUROSEMIDE 40 MG PO TABS
40.0000 mg | ORAL_TABLET | Freq: Every day | ORAL | Status: DC
  Administered 2019-05-22: 40 mg via ORAL
  Filled 2019-05-21: qty 1

## 2019-05-21 MED ORDER — INSULIN ASPART 100 UNIT/ML ~~LOC~~ SOLN
0.0000 [IU] | Freq: Three times a day (TID) | SUBCUTANEOUS | Status: DC
  Administered 2019-05-21: 3 [IU] via SUBCUTANEOUS
  Administered 2019-05-21: 12:00:00 2 [IU] via SUBCUTANEOUS
  Administered 2019-05-22: 3 [IU] via SUBCUTANEOUS
  Administered 2019-05-22: 5 [IU] via SUBCUTANEOUS
  Administered 2019-05-22: 3 [IU] via SUBCUTANEOUS

## 2019-05-21 MED ORDER — ACARBOSE 50 MG PO TABS
100.0000 mg | ORAL_TABLET | Freq: Three times a day (TID) | ORAL | Status: DC
  Administered 2019-05-21 – 2019-05-22 (×3): 100 mg via ORAL
  Filled 2019-05-21: qty 1
  Filled 2019-05-21 (×3): qty 2
  Filled 2019-05-21: qty 1
  Filled 2019-05-21 (×4): qty 2

## 2019-05-21 MED ORDER — APIXABAN 5 MG PO TABS
5.0000 mg | ORAL_TABLET | Freq: Two times a day (BID) | ORAL | Status: DC
  Administered 2019-05-21 – 2019-05-22 (×3): 5 mg via ORAL
  Filled 2019-05-21 (×3): qty 1

## 2019-05-21 NOTE — Progress Notes (Signed)
ANTICOAGULATION CONSULT NOTE - [place] for Heparin Indication: chest pain/ACS  Allergies  Allergen Reactions  . Ivp Dye [Iodinated Diagnostic Agents] Itching and Rash    Patient Measurements: Height: 6' (182.9 cm) Weight: (No scale on the bed) IBW/kg (Calculated) : 77.6 Heparin Dosing Weight: 99 kg   Vital Signs: Temp: 97.8 F (36.6 C) (09/09 0325) Temp Source: Oral (09/09 0325) BP: 128/76 (09/09 0400)  Labs: Recent Labs    05/19/19 1915 05/19/19 1943 05/19/19 2115  05/20/19 0530 05/20/19 1240 05/20/19 2020 05/21/19 0340  HGB  --  9.5*  --   --  8.3*  --   --  8.5*  HCT  --  28.0*  --   --  26.2*  --   --  27.2*  PLT  --   --   --   --  245  --   --  210  APTT 40*  --   --   --   --   --   --   --   LABPROT 15.4*  --   --   --   --   --   --   --   INR 1.2  --   --   --   --   --   --   --   HEPARINUNFRC  --   --   --    < > <0.10* 0.19* 0.40 0.49  CREATININE 1.34*  --   --   --  1.61*  --   --  1.59*  TROPONINIHS 46*  --  49*  --   --   --   --   --    < > = values in this interval not displayed.    Estimated Creatinine Clearance: 57.5 mL/min (A) (by C-G formula based on SCr of 1.59 mg/dL (H)).   Medications: Infusions: . sodium chloride Stopped (05/20/19 0603)  . fentaNYL infusion INTRAVENOUS Stopped (05/20/19 0837)  . heparin 2,100 Units/hr (05/21/19 0355)     Assessment: 5 YOM with prolonged COVID-19 associated hospital stay now with recurrent acute hypoxemic respiratory failure, suspicious for cardiac cause. Pharmacy consulted to start IV heparin for ACS.   Of note, previously started on IV heparin 9/2 for same then discontinued 9/3 due to bleeding from endotracheal tube during previous intubation and blood clots in urine (has catheter for retention); conservative medical management recommended by Cardiology.  Aspirin & plavix continued.   Heparin level 0.49, therapeutic   on heparin at 2100 units/hr  Hgb low/stable at  8.5; Plts inc to 210k   SCr stable at  1.59  No line or bleeding issues per RN.     Goal of Therapy:  Heparin level 0.3-0.5 units/ml Monitor platelets by anticoagulation protocol: Yes   Plan:   Continue heparin infusion at 2100 units/hr  Monitor daily heparin level, CBC and s/s of bleeding  Royetta Asal, PharmD, BCPS 05/21/2019 5:31 AM
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
## 2019-05-21 NOTE — Progress Notes (Signed)
Progress Note  Patient Name: Robert [name] Date of Encounter: 05/21/2019  Primary Cardiologist: Sanda Klein, MD   Subjective   Feeling well. Transferred here from [facility] this afternoon with plans for CIR when he is medically stable. Breathing is stable and he denies shortness of breath at rest. No chest pain.  Inpatient Medications    Scheduled Meds: . acarbose  100 mg Oral TID WC  . apixaban  5 mg Oral BID  . aspirin  81 mg Oral Daily  . atorvastatin  80 mg Oral q1800  . bethanechol  10 mg Oral BID  . clopidogrel  75 mg Oral Daily  . collagenase   Topical Daily  . feeding supplement (ENSURE ENLIVE)  237 mL Oral TID BM  . folic acid  1 mg Oral Daily  . gabapentin  100 mg Oral Q8H  . insulin aspart  0-5 Units Subcutaneous QHS  . insulin aspart  0-9 Units Subcutaneous TID WC  . insulin aspart  3 Units Subcutaneous TID WC  . metoprolol tartrate  50 mg Oral BID  . mometasone-formoterol  2 puff Inhalation BID  . multivitamin with minerals  1 tablet Oral Daily  . mupirocin ointment   Nasal BID  . nystatin   Topical TID  . tamsulosin  0.8 mg Oral QPC supper   Continuous Infusions: . sodium chloride Stopped (05/20/19 0603)   PRN Meds: sodium chloride, acetaminophen, albuterol, ALPRAZolam, diphenhydrAMINE, hydrALAZINE, Resource ThickenUp Clear   Vital Signs    Vitals:   05/21/19 1200 05/21/19 1300 05/21/19 1442 05/21/19 1638  BP:   138/74 140/63  Pulse:   88 92  Resp: (!) 24 (!) 21 16 16   Temp:   98.9 F (37.2 C) 98.6 F (37 C)  TempSrc:   Oral Oral  SpO2: 95% 95% 96% 90%  Weight:      Height:        Intake/Output Summary (Last 24 hours) at 05/21/2019 1726 Last data filed at 05/21/2019 1400 Gross per 24 hour  Intake 1415.51 ml  Output 2315 ml  Net -899.49 ml   Last 3 Weights 05/16/2019 05/15/2019 05/07/2019  Weight (lbs) (No Data) (No Data) 233 lb 14.5 oz  Weight (kg) (No Data) (No Data) 106.1 kg      Telemetry    Normal sinus rhythm -  Personally Reviewed   Physical Exam  Alert, oriented male in NAD on O2 per nasal cannula GEN: No acute distress.   Neck: No JVD Cardiac: RRR, no murmurs, rubs, or gallops.  Respiratory: Prolonged expiration, diminished air movement bilaterally GI: Soft, nontender, non-distended  MS: No edema; No deformity. Neuro:  Nonfocal  Psych: Normal affect   Labs    High Sensitivity Troponin:   Recent Labs  Lab 05/14/19 1655 05/14/19 2020 05/15/19 0610 05/19/19 1915 05/19/19 2115  TROPONINIHS 675* 480* 338* 46* 49*      Chemistry Recent Labs  Lab 05/19/19 1915 05/19/19 1943 05/20/19 0530 05/21/19 0340  NA 136 136 137 140  K 4.3 4.6 4.2 4.3  CL 96*  --  96* 100  CO2 28  --  28 29  GLUCOSE 266*  --  87 85  BUN 23  --  25* 26*  CREATININE 1.34*  --  1.61* 1.59*  CALCIUM 9.1  --  9.1 9.0  PROT 7.3  --  7.0 6.7  ALBUMIN 2.5*  --  2.3* 2.3*  AST 37  --  27 36  ALT 80*  --  68* 63*  ALKPHOS 138*  --  120 111  BILITOT 0.6  --  0.3 0.5  GFRNONAA 55*  --  44* 45*  GFRAA >60  --  51* 52*  ANIONGAP 12  --  13 11     Hematology Recent Labs  Lab 05/18/19 0112  05/19/19 1943 05/20/19 0530 05/21/19 0340  WBC 5.9  --   --  7.4 7.2  RBC 2.70*  --   --  2.79* 2.87*  HGB 8.3*   < > 9.5* 8.3* 8.5*  HCT 25.6*   < > 28.0* 26.2* 27.2*  MCV 94.8  --   --  93.9 94.8  MCH 30.7  --   --  29.7 29.6  MCHC 32.4  --   --  31.7 31.3  RDW 15.9*  --   --  15.9* 15.9*  PLT 155  --   --  245 210   < > = values in this interval not displayed.    BNPNo results for input(s): BNP, PROBNP in the last 168 hours.   DDimer No results for input(s): DDIMER in the last 168 hours.   Radiology    Dg Chest Port 1 View  Result Date: 05/19/2019 CLINICAL DATA:  Intubation and OG placement EXAM: PORTABLE CHEST 1 VIEW COMPARISON:  Chest radiograph 05/19/2019 FINDINGS: Interval placement of an endotracheal tube with tip terminating between the thoracic inlet and carina. Placement of a nasogastric tube with  tip extending below the diaphragm beyond the field of view. Stable cardiomediastinal contours. Bilateral pulmonary infiltrates involving the left mid to lower lung and right lower lung suspicious for multifocal infection and/or edema. No pneumothorax. Possible small left pleural effusion. IMPRESSION: 1. Interval placement of endotracheal and nasogastric tubes as above. 2. Bilateral pulmonary infiltrates suspicious for multifocal infection and/or edema. Electronically Signed   By: Audie Pinto M.D.   On: 05/19/2019 19:32   Dg Chest Port 1 View  Result Date: 05/19/2019 CLINICAL DATA:  Respiratory distress EXAM: PORTABLE CHEST 1 VIEW COMPARISON:  Chest radiographs 05/15/2019, 05/13/2019 FINDINGS: Stable cardiomediastinal contours with enlarged heart size. Central venous congestion. Worsening bilateral multifocal airspace opacities. Possible small left pleural effusion. There is no pneumothorax. No acute osseous abnormality in the visualized bones. IMPRESSION: Worsening bilateral multifocal airspace opacities may represent pulmonary edema or multifocal pneumonia. Electronically Signed   By: Audie Pinto M.D.   On: 05/19/2019 17:47   Dg Abd Portable 1v  Result Date: 05/19/2019 CLINICAL DATA:  Intubation.  OG tube placement.  COVID-19 pneumonia. EXAM: PORTABLE ABDOMEN - 1 VIEW COMPARISON:  One-view abdomen 05/13/2019. FINDINGS: The side port of the NG tube is in the fundus the stomach. Heart size normal. Bilateral lower lobe pneumonia is present. A left pleural effusion is present. IMPRESSION: 1. The side port of the NG tube is in the fundus of the stomach. 2. Bilateral lower lobe airspace disease consistent with known COVID-19 pneumonia. 3. Left pleural effusion. Electronically Signed   By: San Morelle M.D.   On: 05/19/2019 19:42    Cardiac Studies   Echo 05/14/2019: IMPRESSIONS    1. The left ventricle has low normal systolic function, with an ejection fraction of 50-55%. The cavity  size was mildly dilated. moderate basal septal hypertrophy. Left ventricular diastolic Doppler parameters are consistent with restrictive filling.  Elevated left ventricular end-diastolic pressure. Inadequate for regional wall motion assessement due to poor acoutsical windows and inability to adequately visualize the endocardium. Recommend limited definity contrast study.  2. The right  ventricle has normal systolc function. The cavity was normal. There is no increase in right ventricular wall thickness. Right ventricular systolic pressure could not be assessed.  3. Left atrial size was mildly dilated.  4. There is mild mitral annular calcification present. Mitral valve regurgitation is mild to moderate by color flow Doppler.  5. Aortic valve regurgitation was not assessed by color flow Doppler.  6. The aortic root and ascending aorta are normal in size and structure.  7. The inferior vena cava was dilated in size with >50% respiratory variability.  Patient Profile     68 y.o. male with COPD and ischemic heart disease, with prolonged hospitalization for respiratory failure secondary to Covid-19 infection, now transferred to [facility] for medical stabilization prior to [place]    1. Demand ischemia: pt with known CAD, HS-trop peak at 1,269, now trended down. Maintained on antiplatelet Rx with ASA and clopidogrel. Would stop ASA in setting of chronic oral anticoagulation to avoid excessive bleeding risk. No chest pain or sign of true ACS. Would treat medically. 2. Acute on chronic combined systolic and diastolic CHF: no evidence of volume overload on exam. Probably best to keep on a maintenance loop diuretic. His echo shows restrictive filling and he is at risk of pulmonary edema compromising his already tenuous respiratory status. Will add lasix 40 mg PO daily and would check BMET at least twice/week. 3. PAF: maintaining sinus rhythm at present. Telemetry reviewed.  Continue apixaban for anticoagulation. Continue metoprolol.   Overall Mr Schacht looks remarkably good considering his prolonged hospitalization with respiratory failure. We will follow with you. thanks   For questions or updates, please contact [place] Please consult [email or web address] for contact info under        Signed, Sherren Mocha, MD  05/21/2019, 5:26 PM

## 2019-05-21 NOTE — Plan of Care (Signed)
RN called and updated the pt's daughter, [name] over the phone. The pt took meds one at a time in chocolate pudding with HOB>45 degrees. Bathed patient and changed the three dressings on the buttocks. Pt voiced pain in hip and his inability to turn to the right side. Another RN assisted with turning and changing the sheet and pads beneath the pt. Pt frequently takes his oxygen out of his nose and drops his oxygen saturations. Pt is disoriented to place, time and situation which is worse when he is awakened but he easily reorients.

## 2019-05-21 NOTE — Plan of Care (Signed)

## 2019-05-21 NOTE — Progress Notes (Signed)
ANTICOAGULATION CONSULT NOTE - [place] for apixaban Indication: Afib  Allergies  Allergen Reactions  . Ivp Dye [Iodinated Diagnostic Agents] Itching and Rash    Patient Measurements: Height: 6' (182.9 cm) Weight: (No scale on the bed) IBW/kg (Calculated) : 77.6 Heparin Dosing Weight: 99 kg   Vital Signs: Temp: 97.8 F (36.6 C) (09/09 0753) Temp Source: Axillary (09/09 0753) BP: 124/63 (09/09 0900) Pulse Rate: 85 (09/09 0903)  Labs: Recent Labs    05/19/19 1915 05/19/19 1943 05/19/19 2115  05/20/19 0530 05/20/19 1240 05/20/19 2020 05/21/19 0340  HGB  --  9.5*  --   --  8.3*  --   --  8.5*  HCT  --  28.0*  --   --  26.2*  --   --  27.2*  PLT  --   --   --   --  245  --   --  210  APTT 40*  --   --   --   --   --   --   --   LABPROT 15.4*  --   --   --   --   --   --   --   INR 1.2  --   --   --   --   --   --   --   HEPARINUNFRC  --   --   --    < > <0.10* 0.19* 0.40 0.49  CREATININE 1.34*  --   --   --  1.61*  --   --  1.59*  TROPONINIHS 46*  --  49*  --   --   --   --   --    < > = values in this interval not displayed.    Estimated Creatinine Clearance: 57.5 mL/min (A) (by C-G formula based on SCr of 1.59 mg/dL (H)).   Medications: Infusions: . sodium chloride Stopped (05/20/19 0603)     Assessment: 29 YOM with prolonged COVID-19 associated hospital stay now with recurrent acute hypoxemic respiratory failure, suspicious for cardiac cause. Pharmacy initially consulted for IV heparin for ACS, now consulted for apixaban for Afib.   Of note, previously started on IV heparin 9/2, then discontinued 9/3 due to bleeding from endotracheal tube during previous intubation and blood clots in urine (has catheter for retention); conservative medical management recommended by Cardiology.  Aspirin & plavix continued.   Heparin level 0.49  Hgb low/stable at 8.5; Plts inc to 210k   SCr elevated at 1.59, CrCl ~ 57 ml/min  No line or bleeding  issues per RN.    Goal of Therapy:  Heparin level 0.3-0.5 units/ml Monitor platelets by anticoagulation protocol: Yes   Plan:  At 12:00 stop heparin drip and give the first dose of apixaban. Apixaban 5 mg PO BID Continue to monitor CBC closely given bleeding history.  Gretta Arab PharmD, BCPS Clinical pharmacist phone 7am- 5pm: [phone] 05/21/2019 10:45 AM

## 2019-05-21 NOTE — Progress Notes (Signed)
NAME:  Robert [name], MRN:  [id number], DOB:  [birth year], LOS: 45 ADMISSION DATE:  04/11/2019, CONSULTATION DATE:  8/1 REFERRING MD:  Roderic Palau, CHIEF COMPLAINT:  dyspnea   Brief History   68 y/o male with COPD admitted with ARDS from [place] [facility] on 8/1, eventually extubated 8/22.  Returned to the ICU with aspiration and an MI on 9/1, recovered, but the developed respiratory failure again on 9/7 in the setting of hypertension and atrial fibrillation with RVR.    Past Medical History  Anxiety COPD GERD CAD Hypertension Hyperlipidemia Rheumatoid arthritis History of systolic heart failure, however 08/2018 Echo showed LVEF 50-55%, left atrium   Significant Hospital Events   8/1 admission to [facility] ICU 8/2 extubated 8/3 early AM agitated, placed on BIPAP, required re-intubation 8/4 started on steroids for COPD exacerbation 8/8 septic shock requiring pressor support 8/9 off pressors, respiratory cultures +s.aureus 8/10 persistent encephalopathy off sedation, CT head ordered 8/11 encephalopathy improved 8/12 difficulty weaning due to tachypnea, ETT exchanged due to dried blood clot burden 8/13 Bleeding fromsubclavian CVL. Exchanged for PICC line 8/17 started weaning on PSV 14/5 8/22 extubated  9/1 back to ICU after aspiration event, elevated troponin  9/7 back to ICU with hypoxemic respiratory failure, intubated  9/8 extubated, SLP showed severe aspiration risk   Consults:  PCCM  Procedures:  8/1 ETT >8/2, 8/3 >8/22 8/1 L subclavian CVL >8/13 8/13 Rt PICC ETT 9/7 >> 9/8  Significant Diagnostic Tests:  08/2018 Echo> LVEF 50-55%, left atrium dilated  CT head 8/11-mild age-related atrophy and chronic microvascular ischemic changes. Old lacunar infarct.  Micro Data:  7/31 blood >NGTD 7/31 sars-cov-2 >positive 8/1 Resp CX > Normal flora 8/7 Resp CX >MRSA 8/7 BCX > NGTD 8/8 UCX > NGTD 8/15 Resp culture>MRSA   Antimicrobials/COVID Treatment:  Cefepime 7/31  >8/4  Flagyl 7/31  Remdesivir 8/1 >8/5  Actemra 8/1  Decadron/solumedrol 8/1 >8/8  Zosyn 8/8>8/9 Azithro 8/7>8/9  Cefepime 8/14 >off Vanc 7/31, 8/8>8/22  Unasyn 9/1 > completed  Interim history/subjective:   Extubated yesterday  Slept OK Feels OK Nearly off oxygen  Objective   Blood pressure 129/73, pulse (!) 102, temperature 97.8 F (36.6 C), temperature source Oral, resp. rate 18, height 6' (1.829 m), weight 106.1 kg, SpO2 95 %.    Vent Mode: PSV;CPAP FiO2 (%):  [40 %-50 %] 40 % Set Rate:  [20 bmp] 20 bmp Vt Set:  [620 mL] 620 mL PEEP:  [5 cmH20] 5 cmH20 Pressure Support:  [5 cmH20] 5 cmH20 Plateau Pressure:  [18 cmH20] 18 cmH20   Intake/Output Summary (Last 24 hours) at 05/21/2019 0728 Last data filed at 05/21/2019 [id number] Gross per 24 hour  Intake 862.5 ml  Output 1890 ml  Net -1027.5 ml   Filed Weights   05/06/19 0500 05/07/19 0235  Weight: 111 kg 106.1 kg    Examination:  General:  Resting comfortably in bed HENT: NCAT OP clear PULM: few crackles bases B, normal effort CV: RRR, no mgr GI: BS+, soft, nontender MSK: normal bulk and tone Neuro: awake, alert, no distress, MAEW  9/7 CXR shows bilateral airspace disease, batwing pattern   Resolved Hospital Problem list     Assessment & Plan:  Acute respiratory failure with hypoxemia due to recurrent cardiogenic edema from acute hypertension in setting of diastolic heart failure and CAD on 9/7: respiratory failure with hypoxemia, resolved Monitor I/O closely  Continue metoprolol ? Value to adding low dose nitrate Cardiology input would be valuable Tele  Severe aspiration  risk: NPO FEES at [facility]  Nocturnal hypoxemia: OSA? > trial CPAP qHS on [facility] 2[facility] > will need outpatient sleep study  DDx of decompensation 9/7 includes primary neuro event, tia? Seems less likely based on today's exam Consider MRI brain Monitor neuro exam   Atrial fib Tele Metoprolol Heparin> convert to eliquis   Mild AKI: stable Monitor BMET and UOP Replace electrolytes as needed  COVID resolved Off airborne precautions  COPD: not in exacerbation Prn bronchodilators  Physical deconditioning:  To [facility] for CIR  Best practice:  Diet: NPO Pain/Anxiety/Delirium protocol (if indicated): d/c today VAP protocol (if indicated): yes DVT prophylaxis: heparin infusion GI prophylaxis: n/a post exacerbation Glucose control: SSI Mobility: bed rest today Code Status: Full Family Communication: will clarify with TRH Disposition: remain in ICU  Labs   CBC: Recent Labs  Lab 05/16/19 0139 05/17/19 0114 05/18/19 0112 05/19/19 1745 05/19/19 1943 05/20/19 0530 05/21/19 0340  WBC 6.0 5.9 5.9  --   --  7.4 7.2  NEUTROABS  --   --   --   --   --  5.4 5.1  HGB 8.0* 8.3* 8.3* 10.2* 9.5* 8.3* 8.5*  HCT 25.1* 26.2* 25.6* 30.0* 28.0* 26.2* 27.2*  MCV 94.7 93.9 94.8  --   --  93.9 94.8  PLT 149* 159 155  --   --  245 [id number]    Basic Metabolic Panel: Recent Labs  Lab 05/17/19 0114 05/18/19 0112 05/19/19 0405 05/19/19 1745 05/19/19 1915 05/19/19 1943 05/20/19 0530 05/21/19 0340  NA 137 138  --  136 136 136 137 140  K 4.3 3.9  --  4.1 4.3 4.6 4.2 4.3  CL 95* 95*  --   --  96*  --  96* 100  CO2 32 33*  --   --  28  --  28 29  GLUCOSE 175* 125*  --   --  266*  --  87 85  BUN 25* 22  --   --  23  --  25* 26*  CREATININE 1.52* 1.40*  --   --  1.34*  --  1.61* 1.59*  CALCIUM 8.5* 8.5*  --   --  9.1  --  9.1 9.0  MG 1.7 1.7 2.0  --  1.9  --  2.2 2.2  PHOS  --   --   --   --   --   --  4.6 5.1*   GFR: Estimated Creatinine Clearance: 57.5 mL/min (A) (by C-G formula based on SCr of 1.59 mg/dL (H)). Recent Labs  Lab 05/14/19 0801  05/15/19 0610 05/16/19 0139 05/17/19 0114 05/18/19 0112 05/20/19 0530 05/21/19 0340  PROCALCITON 0.49  --  0.70 0.53  --   --   --   --   WBC 6.7   < > 6.5 6.0 5.9 5.9 7.4 7.2   < > = values in this interval not displayed.    Liver Function Tests: Recent Labs   Lab 05/17/19 0114 05/18/19 0112 05/19/19 1915 05/20/19 0530 05/21/19 0340  AST 37 44* 37 27 36  ALT 68* 79* 80* 68* 63*  ALKPHOS 144* 131* 138* 120 111  BILITOT 0.5 0.2* 0.6 0.3 0.5  PROT 6.6 6.4* 7.3 7.0 6.7  ALBUMIN 2.2* 2.1* 2.5* 2.3* 2.3*   No results for input(s): LIPASE, AMYLASE in the last 168 hours. No results for input(s): AMMONIA in the last 168 hours.  ABG    Component Value Date/Time   PHART 7.403 05/19/2019 1943  PCO2ART 52.4 (H) 05/19/2019 1943   PO2ART 390.0 (H) 05/19/2019 1943   HCO3 32.8 (H) 05/19/2019 1943   TCO2 34 (H) 05/19/2019 1943   ACIDBASEDEF 1.0 04/24/2019 0424   O2SAT 100.0 05/19/2019 1943     Coagulation Profile: Recent Labs  Lab 05/19/19 1915  INR 1.2    Cardiac Enzymes: No results for input(s): CKTOTAL, CKMB, CKMBINDEX, TROPONINI in the last 168 hours.  HbA1C: Hgb A1c MFr Bld  Date/Time Value Ref Range Status  04/11/2019 09:50 PM 7.4 (H) 4.8 - 5.6 % Final    Comment:    (NOTE) Pre diabetes:          5.7%-6.4% Diabetes:              >6.4% Glycemic control for   <7.0% adults with diabetes   06/07/2017 12:02 AM 9.1 (H) 4.8 - 5.6 % Final    Comment:    (NOTE) Pre diabetes:          5.7%-6.4% Diabetes:              >6.4% Glycemic control for   <7.0% adults with diabetes     CBG: Recent Labs  Lab 05/19/19 2152 05/20/19 0848 05/20/19 1257 05/20/19 1659 05/20/19 2133  GLUCAP 281* 81 79 90 71       Critical care time: n/a    Roselie Awkward, MD [facility] Pager: [phone] Cell: [phone] If no response, call [phone]

## 2019-05-21 NOTE — Discharge Instructions (Signed)

## 2019-05-21 NOTE — Progress Notes (Signed)
Pt picked up by transport to go to [facility] 2W11.  Called report to RN.  VSS at pickup.  Pt in NAD.

## 2019-05-21 NOTE — Consult Note (Signed)
[place] Nurse wound follow up Patient receiving care in [place].  F/U on bilateral buttock and coccyx wounds performed via Telehealth and assistance of primary RN, Josh. Wound type: all three are unstageable Measurement: To be provided by the bedside RN in the flowsheet section Wound [id number] buttock wound bed is 100% dark brown and yellow. Coccyx and left buttock wounds are 100% yellow. Drainage (amount, consistency, odor) none Periwound: intact Dressing procedure/placement/frequency: continue with Santyl and saline moistened gauze.  If there is not significant improvement next week, it might be a good idea to try Dakin's solution for a short while. Monitor the wound area(s) for worsening of condition such as: Signs/symptoms of infection,  Increase in size,  Development of or worsening of odor, Development of pain, or increased pain at the affected locations.  Notify the medical team if any of these develop.  Val Riles, RN, MSN, CWOCN, CNS-BC, pager [phone]

## 2019-05-21 NOTE — Progress Notes (Addendum)
[facility] PROGRESS NOTE    Progress Note  Robert [name]  [id number] DOB: 1951/01/09 DOA: 04/11/2019 PCP: Redmond School, MD     Brief Narrative:   Robert [name] is an 68 y.o. male past medical history of COPD, ischemic cardiomyopathy, diabetes mellitus and rheumatoid arthritis on chronic prednisone, admitted on 04/11/2019 for shortness of breath and COVID-19 positive and CXR showing bilateral and filtrates.  Intubated and transferred to [facility], difficulty weaning who failed extubation 04/13/2019, hospital course was complicated by MRSA pneumonia and septic shock for which he has completed treatment.  Eventually successfully extubated 06/03/2019 and transferred to the floor on 05/05/2019.  Developed worsening respiratory failure 05/13/2019 and transferred back to the ICU.  Significant events: 04/12/2019 admission to [facility] CICU 18 2020 extubated 04/14/2019 early a.m. agitation placed on BiPAP requiring intubation. 04/15/2019 started on steroids for COPD exacerbation. 04/19/2019 septic shock requiring pressor support 04/20/2019 off pressors respiratory cultures grew positive for staph aureus. 04/20/2019 persistent encephalopathy off sedation CT of the head showed no acute findings. 04/22/19/2020 encephalopathy improved 04/23/2019 wean was difficult due to tachypnea, his ETT was exchanged due to dry blood and clot burden. 04/24/2019 bleeding from subclavian central line, exchanged for a PICC line. 04/28/2019 started to wean on PRVC 14 and 5. 05/03/2019 successfully extubated. 05/13/2019 back in the ICU after after an aspiration event with elevated cardiac biomarkers. with elevated troponins which have trended, cardiology was consulted and recommended conservative management. 05/19/2019 back in the ICU for hypoxic respiratory failure requiring intubation due to an episode of flash pulmonary edema  04/20/2019 successfully extubated and now on 1 L of oxygen. 04/18/2019   Procedures: 8/1 ETT >8/2,  8/3 >8/22 8/1 L subclavian CVL >8/13 8/13 Rt PICC ETT 9/7>>9/18 August 2018 echo that showed an EF of 50% left atrium dilated. CT of the head on 08/01/2019 that showed mild related age atrophy chronic microvascular changes and old lacunar infarct no acute findings.  Microbiological data: 04/11/2019 blood cultures negative till date 04/11/2019 SARS-CoV-2 2+ 04/12/2019 respiratory culture showed normal flora. 04/18/2019 respiratory culture grew MRSA 04/18/2019 blood culture remain negative till date 04/19/2019 urine culture remain negative till date 04/26/2019 respiratory culture grew MRSA. 05/11/2019 SARS-CoV-2 test negative 05/14/2019 SARS-CoV-2 test negative  Antibiotic/COVID-19 treatment Cefepime 04/11/2019>> 04/15/2019 Flagyl 731 Remdesivir 04/12/2019>> 04/16/2019 Actemra 04/12/2019 Decadron/Solu-Medrol 04/12/2019>> 04/19/2019 Zosyn 04/19/2019>> 06/01/2019 Azithromycin 04/18/2019>> 04/20/2019 Cefepime 04/25/2019>> 2 04/29/2019 Vancomycin 04/25/2019>>> 05/02/2019 Unasyn 05/13/2019>> 05/18/2019  Assessment/Plan:   Acute respiratory failure with hypoxia multifactorial in the setting of COVID-19 pneumonia, COPD exacerbation/MRSA pneumonia and flash pulmonary edema (04/18/2019): With prolonged and complicated hospital stay successfully extubated on 05/20/2019. Patient continues to smoke 1 pack/day.  Is currently on 0.5- 1 L of oxygen satting 92%. He has completed his course of IV Remdesivir and steroids. He received 1 dose of Actemra on 04/12/2019. hysical therapy evaluated the patient and recommended CIR. We will need to obtain ambulatory SPO2 prior to discharge. On 05/19/2019 patient desatted which required 15 L of high flow nasal cannula, transferred to [facility] was delayed as CIR cannot accommodate this high flow rate.  Was intubated for 24 hours due to flash pulmonary edema. Patient decompensated again on 05/19/2019, reintubated and successfully extubated on 05/20/2019 has now been requiring less and less oxygen  until this morning he is on half a liter, he needed 1 L of oxygen overnight. He will be transferred to [facility], he has had 2 SARS-CoV-2 is negative. It was discussed with PCCM and patient can  be transferred to [facility] for CPAP with auto titration, in addition [name] from [facility] will like to watch the patient on CPAP overnight to see how he remained stable.  Aspiration pneumonia: He has completed a 5-day course of IV Unasyn. On 05/13/2019 chest x-ray show aspirate. Speech evaluated the patient and recommended a dysphagia 1 diet with honey thick liquid.  MRSA pneumonia/septic shock: Off pressors complete his course of IV antibiotics.  Elevaded cardiac biomarkers/CAD: On 05/14/2019 echocardiogram showed an EF of 50%. He has a known chronic total occlusion of the RCA, 60% stenosis of the LAD and first diagonal artery. 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 Cardiac biomarkers are trending down. Cardiology was curb sided who recommended medical management.  Acute on chronic systolic and diastolic heart failure/demand ischemia/flash pulmonary edema: The previous physician discussed with cardiology and he will need a ischemic work-up. Currently auto diuresing will start low-dose Lasix, continue check basic metabolic panels daily to monitor on potassium and creatinine improvement.  Paroxysmal atrial fibrillation with A. fib and RVR: Currently in normal sinus rhythm. Discontinue IV heparin transfer to oral Eliquis.  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 Resume his dysphagia 1 diet with honey thickened liquid. Continue sliding scale insulin check CBGs before meals and at bedtime.  Hyperlipidemia: Continue statins.  Severe deconditioning/due to prolonged ICU stay: Physical therapy evaluated the patient recommended CIR, CIR wants the patient to be tried on CPAP before admitting him to CIR.   He probably has obstructive sleep apnea that is why he is de-satting at night.  Hyponatremia: Likely due to hypovolemia resolved with IV fluid hydration.  Hyperkalemia: Keep potassium greater than 4. Replete orally as needed.  Hypomagnesemia: Replete orally, try to keep potassium greater than 2.  Acute kidney injury: With a baseline creatinine of 0.8. After his acute decompensation and worsening to 1.6, he has been auto diuresing and his creatinine today has improved to 1.5.  We will continue to watch him off Lasix and start him on Lasix in a few days if his creatinine continues to improve remember to resume his home dose of potassium 2.  ET tube clotting: ET tube exchange in 04/23/2019. Now resolved.  Acute urinary retention: Patient had an episode of blood clots overnight. Heparin had been DC'd. He is currently on Flomax on 05/20/2019 the urine started to clear patient has not passed his voiding trial. Spoke with urology who recommended to change the Foley catheter once a months, until he is finished with his rehabilitation in CIR and then he should follow-up with them as an outpatient.  Thrombocytopenia: Continue to monitor.  Rheumatoid arthritis: Was given a single dose of Actemra, continue to hold methotrexate.  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 dark purple with some skin sloughing on lowe (Active)  04/17/19 0730  Location: Sacrum  Location Orientation: Medial  Staging: Deep Tissue Injury - Purple or maroon localized area of discolored intact skin or blood-filled blister due to damage of underlying soft tissue from pressure and/or shear.  Wound Description (Comments): dark purple with some skin sloughing on lower sacral  area  Present on Admission: No     Pressure Injury 04/17/19 Buttocks Right;Lower Unstageable - Full thickness tissue loss  in which the base of the ulcer is covered by slough (yellow, tan, gray, green or brown) and/or eschar (tan, brown or black) in the wound bed. (Active)  04/17/19 0730  Location: Buttocks  Location Orientation: Right;Lower  Staging: Unstageable - Full thickness tissue loss in which the base of the ulcer is covered by slough (yellow, tan, gray, green or brown) and/or eschar (tan, brown or black) in the wound bed.  Wound Description (Comments):   Present on Admission: No     Pressure Injury 04/17/19 Buttocks Left;Lower WOC updated to Stage 3 on 05/06/19 at the time of assessment (Active)  04/17/19 0730  Location: Buttocks  Location Orientation: Left;Lower  Staging:   Wound Description (Comments): WOC updated to Stage 3 on 05/06/19 at the time of assessment  Present on Admission: No    Estimated body mass index is 31.72 kg/m as calculated from the following:   Height as of this encounter: 6' (1.829 m).   Weight as of this encounter: 106.1 kg. Malnutrition Type:  Nutrition Problem: Inadequate oral intake Etiology: poor appetite(loss of taste)   Malnutrition Characteristics:  Signs/Symptoms: per patient/family report(PO intake documented as <25%)   Nutrition Interventions:  Interventions: Ensure Enlive (each supplement provides 350kcal and 20 grams of protein), MVI, Magic cup, Hormel Shake, Refer to RD note for recommendations    DVT prophylaxis: Eliquis Family Communication:none Disposition Plan/Barrier to D/C: [facility] for C-pap Code Status:     Code Status Orders  (From admission, onward)         Start     Ordered   04/12/19 0647  Full code  Continuous     04/12/19 0646        Code Status History    Date Active Date Inactive Code Status Order ID Comments User Context   08/27/2018 0243 08/28/2018 1830 Full Code [id number]  Oswald Hillock, MD  Inpatient   06/06/2017 2355 06/11/2017 2011 Full Code [id number]  Belva Crome, MD Inpatient   03/03/2017 1947 03/05/2017 1741 Full Code [id number]  Truett Mainland, DO ED   12/09/2016 1624 12/13/2016 1854 Full Code [id number]  Greer Pickerel, MD Inpatient   10/08/2016 1600 10/09/2016 1544 Full Code [id number]  Jule Ser, DO Inpatient   03/22/2015 0844 03/23/2015 1644 Full Code [id number]  Leonie Man, MD Inpatient   03/20/2015 1507 03/22/2015 0844 Full Code [id number]  Jerline Pain, MD Inpatient   02/01/2015 0935 02/02/2015 1420 Full Code [id number]  Belva Crome, MD Inpatient   09/19/2013 0109 09/19/2013 1500 Full Code [id number]  Cletus Gash, MD Inpatient   09/01/2013 2125 09/03/2013 1610 Full Code [id number]  Brett Canales, PA-C Inpatient   12/21/2011 2337 12/23/2011 1227 Full Code [id number]  Ferne Coe, RN Inpatient   Advance Care Planning Activity        IV Access:    Peripheral IV   Procedures and diagnostic studies:   Dg Chest Port 1 View  Result Date: 05/19/2019 CLINICAL DATA:  Intubation and OG placement EXAM: PORTABLE CHEST 1 VIEW COMPARISON:  Chest radiograph 05/19/2019 FINDINGS: Interval placement of an endotracheal tube with tip terminating between the thoracic inlet and carina. Placement of a nasogastric tube with tip extending below the diaphragm beyond the field of view. Stable cardiomediastinal contours. Bilateral pulmonary infiltrates involving the left mid to lower lung and right lower lung suspicious for multifocal infection and/or edema. No pneumothorax. Possible small left pleural effusion. IMPRESSION: 1. Interval placement of endotracheal and nasogastric tubes as above. 2. Bilateral  pulmonary infiltrates suspicious for multifocal infection and/or edema. Electronically Signed   By: Audie Pinto M.D.   On: 05/19/2019 19:32   Dg Chest Port 1 View  Result Date: 05/19/2019 CLINICAL DATA:  Respiratory distress EXAM: PORTABLE CHEST 1 VIEW COMPARISON:  Chest  radiographs 05/15/2019, 05/13/2019 FINDINGS: Stable cardiomediastinal contours with enlarged heart size. Central venous congestion. Worsening bilateral multifocal airspace opacities. Possible small left pleural effusion. There is no pneumothorax. No acute osseous abnormality in the visualized bones. IMPRESSION: Worsening bilateral multifocal airspace opacities may represent pulmonary edema or multifocal pneumonia. Electronically Signed   By: Audie Pinto M.D.   On: 05/19/2019 17:47   Dg Abd Portable 1v  Result Date: 05/19/2019 CLINICAL DATA:  Intubation.  OG tube placement.  COVID-19 pneumonia. EXAM: PORTABLE ABDOMEN - 1 VIEW COMPARISON:  One-view abdomen 05/13/2019. FINDINGS: The side port of the NG tube is in the fundus the stomach. Heart size normal. Bilateral lower lobe pneumonia is present. A left pleural effusion is present. IMPRESSION: 1. The side port of the NG tube is in the fundus of the stomach. 2. Bilateral lower lobe airspace disease consistent with known COVID-19 pneumonia. 3. Left pleural effusion. Electronically Signed   By: San Morelle M.D.   On: 05/19/2019 19:42     Medical Consultants:    None.  Anti-Infectives:   None  Subjective:    Robert [name] relates he feels great he sitting up in bed texting and talkative and hope we will.  He relates he is hungry.  Objective:    Vitals:   05/21/19 0400 05/21/19 0500 05/21/19 0600 05/21/19 0700  BP: 128/76 117/71 130/75 129/73  Pulse:      Resp: (!) 23 18 (!) 22 18  Temp:      TempSrc:      SpO2: 100% 95% 92% 95%  Weight:      Height:       SpO2: 95 % O2 Flow Rate (L/min): 2 L/min FiO2 (%): 40 %(Titrated to 40%, sats 95%.)   Intake/Output Summary (Last 24 hours) at 05/21/2019 0732 Last data filed at 05/21/2019 0641 Gross per 24 hour  Intake 862.5 ml  Output 1890 ml  Net -1027.5 ml   Filed Weights   05/06/19 0500 05/07/19 0235  Weight: 111 kg 106.1 kg    Exam: General exam: In no acute  distress, in a good mood when I went to the room he was off oxygen satting 86% on room air. Respiratory system: Good air movement and crackles mainly on the right. Cardiovascular system: S1 & S2 heard, RRR. No JVD. Gastrointestinal system: Abdomen is nondistended, soft and nontender.  Central nervous system: Alert and oriented. No focal neurological deficits. Extremities: No pedal edema. Skin: No rashes, lesions or ulcers Psychiatry: Judgement and insight appear normal. Mood & affect appropriate.  In a good mood jovial and happy he has moving of [place] and going to CIR.   Data Reviewed:    Labs: Basic Metabolic Panel: Recent Labs  Lab 05/17/19 0114 05/18/19 0112 05/19/19 0405 05/19/19 1745 05/19/19 1915 05/19/19 1943 05/20/19 0530 05/21/19 0340  NA 137 138  --  136 136 136 137 140  K 4.3 3.9  --  4.1 4.3 4.6 4.2 4.3  CL 95* 95*  --   --  96*  --  96* 100  CO2 32 33*  --   --  28  --  28 29  GLUCOSE 175* 125*  --   --  266*  --  87 85  BUN 25* 22  --   --  23  --  25* 26*  CREATININE 1.52* 1.40*  --   --  1.34*  --  1.61* 1.59*  CALCIUM 8.5* 8.5*  --   --  9.1  --  9.1 9.0  MG 1.7 1.7 2.0  --  1.9  --  2.2 2.2  PHOS  --   --   --   --   --   --  4.6 5.1*   GFR Estimated Creatinine Clearance: 57.5 mL/min (A) (by C-G formula based on SCr of 1.59 mg/dL (H)). Liver Function Tests: Recent Labs  Lab 05/17/19 0114 05/18/19 0112 05/19/19 1915 05/20/19 0530 05/21/19 0340  AST 37 44* 37 27 36  ALT 68* 79* 80* 68* 63*  ALKPHOS 144* 131* 138* 120 111  BILITOT 0.5 0.2* 0.6 0.3 0.5  PROT 6.6 6.4* 7.3 7.0 6.7  ALBUMIN 2.2* 2.1* 2.5* 2.3* 2.3*   No results for input(s): LIPASE, AMYLASE in the last 168 hours. No results for input(s): AMMONIA in the last 168 hours. Coagulation profile Recent Labs  Lab 05/19/19 1915  INR 1.2   COVID-19 Labs  No results for input(s): DDIMER, FERRITIN, LDH, CRP in the last 72 hours.  Lab Results  Component Value Date   SARSCOV2NAA NOT  DETECTED 05/14/2019   SARSCOV2NAA NOT DETECTED 05/11/2019   SARSCOV2NAA POSITIVE (A) 05/07/2019   SARSCOV2NAA NOT DETECTED 05/06/2019    CBC: Recent Labs  Lab 05/16/19 0139 05/17/19 0114 05/18/19 0112 05/19/19 1745 05/19/19 1943 05/20/19 0530 05/21/19 0340  WBC 6.0 5.9 5.9  --   --  7.4 7.2  NEUTROABS  --   --   --   --   --  5.4 5.1  HGB 8.0* 8.3* 8.3* 10.2* 9.5* 8.3* 8.5*  HCT 25.1* 26.2* 25.6* 30.0* 28.0* 26.2* 27.2*  MCV 94.7 93.9 94.8  --   --  93.9 94.8  PLT 149* 159 155  --   --  245 210   Cardiac Enzymes: No results for input(s): CKTOTAL, CKMB, CKMBINDEX, TROPONINI in the last 168 hours. BNP (last 3 results) No results for input(s): PROBNP in the last 8760 hours. CBG: Recent Labs  Lab 05/19/19 2152 05/20/19 0848 05/20/19 1257 05/20/19 1659 05/20/19 2133  GLUCAP 281* 81 79 90 71   D-Dimer: No results for input(s): DDIMER in the last 72 hours. Hgb A1c: No results for input(s): HGBA1C in the last 72 hours. Lipid Profile: No results for input(s): CHOL, HDL, LDLCALC, TRIG, CHOLHDL, LDLDIRECT in the last 72 hours. Thyroid function studies: No results for input(s): TSH, T4TOTAL, T3FREE, THYROIDAB in the last 72 hours.  Invalid input(s): FREET3 Anemia work up: No results for input(s): VITAMINB12, FOLATE, FERRITIN, TIBC, IRON, RETICCTPCT in the last 72 hours. Sepsis Labs: Recent Labs  Lab 05/14/19 0801  05/15/19 0610 05/16/19 0139 05/17/19 0114 05/18/19 0112 05/20/19 0530 05/21/19 0340  PROCALCITON 0.49  --  0.70 0.53  --   --   --   --   WBC 6.7   < > 6.5 6.0 5.9 5.9 7.4 7.2   < > = values in this interval not displayed.   Microbiology Recent Results (from the past 240 hour(s))  Novel Coronavirus, NAA (hospital order; send-out to ref lab)     Status: None   Collection Time: 05/11/19  1:58 PM   Specimen: Nasopharyngeal Swab; Respiratory  Result Value Ref Range Status   SARS-CoV-2, NAA NOT DETECTED NOT DETECTED Final  Comment: (NOTE) This nucleic  acid amplification test was developed and its perfomance characteristics determined by [employer]. Nucleic acid amplification tests include PCR and TMA. This test has not been FDA cleared or approved. This test has been authorized by FDA under an Emergency Use Authorization (EUA). This test is only authorized for the duration of time the declaration that circumstances exist justifying the authorization of the emergency use of in vitro diagnostic tests for detection of SARS-CoV-2 virus and/or diagnosis of COVID-19 infection under section 564(b)(1) of the Act, 21 U.S.C. [id number]) (1), unless the authorization is terminated or revoked sooner. When diagnostic testing is negative, the possibility of a false negative result should be considered in the context of a patient's recent exposures and the presence of clinical signs and symptoms consistent with COVID-19. An individual without symptoms of COVID- 19 and who is not shedding SARS-CoV-2 vir [place] would expect to have a negative (not detected) result in this assay. Performed At: [place], [place] [id number] Rush Farmer MD [id number]    [place]  Final    Comment: Performed at [facility] [address], [place], [place] [address]  MRSA PCR Screening     Status: Abnormal   Collection Time: 05/13/19 11:29 PM   Specimen: Nasopharyngeal  Result Value Ref Range Status   MRSA by PCR POSITIVE (A) NEGATIVE Final    Comment:        The GeneXpert MRSA Assay (FDA approved for NASAL specimens only), is one component of a comprehensive MRSA colonization surveillance program. It is not intended to diagnose MRSA infection nor to guide or monitor treatment for MRSA infections. CRITICAL RESULT CALLED TO, READ BACK BY AND VERIFIED WITH: MOSLEY, L. RN @0800  ON 9.2.2020 BY [facility] Performed at [facility], [place] [address]  [place], [place] [address]   Novel Coronavirus, NAA (hospital order; send-out to ref lab)     Status: None   Collection Time: 05/14/19 12:17 PM   Specimen: Nasopharyngeal Swab; Respiratory  Result Value Ref Range Status   SARS-CoV-2, NAA NOT DETECTED NOT DETECTED Final    Comment: (NOTE) This nucleic acid amplification test was developed and its performance characteristics determined by [employer]. Nucleic acid amplification tests include PCR and TMA. This test has not been FDA cleared or approved. This test has been authorized by FDA under an Emergency Use Authorization (EUA). This test is only authorized for the duration of time the declaration that circumstances exist justifying the authorization of the emergency use of in vitro diagnostic tests for detection of SARS-CoV-2 virus and/or diagnosis of COVID-19 infection under section 564(b)(1) of the Act, 21 U.S.C. [id number]) (1), unless the authorization is terminated or revoked sooner. When diagnostic testing is negative, the possibility of a false negative result should be considered in the context of a patient's recent exposures and the presence of clinical signs and symptoms consistent with COVID-19. An individual without symptoms of COVID- 19 and who is not shedding SARS-CoV-2 vi rus would expect to have a negative (not detected) result in this assay. Performed At: [facility] [place], [place] [id number] Rush Farmer MD [id number]    [place]  Final    Comment: Performed at [facility] [address], [place], [place] [address]     Medications:    aspirin  81 mg Oral Daily   atorvastatin  80 mg Oral q1800   bethanechol  10 mg Oral  BID   Chlorhexidine Gluconate Cloth  6 each Topical Daily   clopidogrel  75 mg Oral Daily   collagenase   Topical Daily   feeding supplement (ENSURE ENLIVE)  237 mL Oral TID BM   folic acid  1 mg Oral  Daily   gabapentin  100 mg Oral Q8H   insulin aspart  0-20 Units Subcutaneous TID AC & HS   mouth rinse  15 mL Mouth Rinse BID   metoprolol tartrate  50 mg Oral BID   mometasone-formoterol  2 puff Inhalation BID   multivitamin with minerals  1 tablet Oral Daily   mupirocin ointment   Nasal BID   nystatin   Topical TID   senna-docusate  3 tablet Oral BID   sodium chloride flush  10-40 mL Intracatheter Q12H   tamsulosin  0.8 mg Oral QPC supper   Continuous Infusions:  sodium chloride Stopped (05/20/19 0603)   fentaNYL infusion INTRAVENOUS Stopped (05/20/19 0837)   heparin 2,100 Units/hr (05/21/19 0611)     LOS: 38 days   Charlynne Cousins  [facility]  05/21/2019, 7:32 AM

## 2019-05-22 ENCOUNTER — Inpatient Hospital Stay (HOSPITAL_COMMUNITY): Payer: Medicare HMO

## 2019-05-22 ENCOUNTER — Inpatient Hospital Stay (HOSPITAL_COMMUNITY)
Admission: RE | Admit: 2019-05-22 | Discharge: 2019-06-07 | DRG: 945 | Disposition: A | Discharge status: Home Health | Payer: Medicare HMO | Source: Intra-hospital | Attending: Physical Medicine & Rehabilitation | Admitting: Physical Medicine & Rehabilitation

## 2019-05-22 DIAGNOSIS — F064 Anxiety disorder due to known physiological condition: Secondary | ICD-10-CM | POA: Diagnosis not present

## 2019-05-22 DIAGNOSIS — R1310 Dysphagia, unspecified: Secondary | ICD-10-CM | POA: Diagnosis not present

## 2019-05-22 DIAGNOSIS — I130 Hypertensive heart and chronic kidney disease with heart failure and stage 1 through stage 4 chronic kidney disease, or unspecified chronic kidney disease: Secondary | ICD-10-CM | POA: Diagnosis not present

## 2019-05-22 DIAGNOSIS — U071 COVID-19: Secondary | ICD-10-CM

## 2019-05-22 DIAGNOSIS — J810 Acute pulmonary edema: Secondary | ICD-10-CM | POA: Diagnosis not present

## 2019-05-22 DIAGNOSIS — Z7982 Long term (current) use of aspirin: Secondary | ICD-10-CM

## 2019-05-22 DIAGNOSIS — G4700 Insomnia, unspecified: Secondary | ICD-10-CM | POA: Diagnosis not present

## 2019-05-22 DIAGNOSIS — T502X5A Adverse effect of carbonic-anhydrase inhibitors, benzothiadiazides and other diuretics, initial encounter: Secondary | ICD-10-CM | POA: Diagnosis present

## 2019-05-22 DIAGNOSIS — E1151 Type 2 diabetes mellitus with diabetic peripheral angiopathy without gangrene: Secondary | ICD-10-CM | POA: Diagnosis present

## 2019-05-22 DIAGNOSIS — M069 Rheumatoid arthritis, unspecified: Secondary | ICD-10-CM | POA: Diagnosis present

## 2019-05-22 DIAGNOSIS — L89152 Pressure ulcer of sacral region, stage 2: Secondary | ICD-10-CM | POA: Diagnosis not present

## 2019-05-22 DIAGNOSIS — Z8619 Personal history of other infectious and parasitic diseases: Secondary | ICD-10-CM

## 2019-05-22 DIAGNOSIS — I2510 Atherosclerotic heart disease of native coronary artery without angina pectoris: Secondary | ICD-10-CM | POA: Diagnosis present

## 2019-05-22 DIAGNOSIS — Z87891 Personal history of nicotine dependence: Secondary | ICD-10-CM

## 2019-05-22 DIAGNOSIS — R0602 Shortness of breath: Secondary | ICD-10-CM | POA: Diagnosis not present

## 2019-05-22 DIAGNOSIS — F419 Anxiety disorder, unspecified: Secondary | ICD-10-CM | POA: Diagnosis present

## 2019-05-22 DIAGNOSIS — I110 Hypertensive heart disease with heart failure: Secondary | ICD-10-CM | POA: Diagnosis present

## 2019-05-22 DIAGNOSIS — E7800 Pure hypercholesterolemia, unspecified: Secondary | ICD-10-CM | POA: Diagnosis present

## 2019-05-22 DIAGNOSIS — Z801 Family history of malignant neoplasm of trachea, bronchus and lung: Secondary | ICD-10-CM | POA: Diagnosis not present

## 2019-05-22 DIAGNOSIS — E1165 Type 2 diabetes mellitus with hyperglycemia: Secondary | ICD-10-CM | POA: Diagnosis not present

## 2019-05-22 DIAGNOSIS — F411 Generalized anxiety disorder: Secondary | ICD-10-CM | POA: Diagnosis not present

## 2019-05-22 DIAGNOSIS — I252 Old myocardial infarction: Secondary | ICD-10-CM

## 2019-05-22 DIAGNOSIS — K219 Gastro-esophageal reflux disease without esophagitis: Secondary | ICD-10-CM | POA: Diagnosis present

## 2019-05-22 DIAGNOSIS — E876 Hypokalemia: Secondary | ICD-10-CM | POA: Diagnosis not present

## 2019-05-22 DIAGNOSIS — H9190 Unspecified hearing loss, unspecified ear: Secondary | ICD-10-CM | POA: Diagnosis not present

## 2019-05-22 DIAGNOSIS — I5023 Acute on chronic systolic (congestive) heart failure: Secondary | ICD-10-CM | POA: Diagnosis not present

## 2019-05-22 DIAGNOSIS — R339 Retention of urine, unspecified: Secondary | ICD-10-CM | POA: Diagnosis present

## 2019-05-22 DIAGNOSIS — Z800 Family history of malignant neoplasm of digestive organs: Secondary | ICD-10-CM

## 2019-05-22 DIAGNOSIS — I255 Ischemic cardiomyopathy: Secondary | ICD-10-CM | POA: Diagnosis present

## 2019-05-22 DIAGNOSIS — Z955 Presence of coronary angioplasty implant and graft: Secondary | ICD-10-CM

## 2019-05-22 DIAGNOSIS — J9601 Acute respiratory failure with hypoxia: Secondary | ICD-10-CM | POA: Diagnosis not present

## 2019-05-22 DIAGNOSIS — I480 Paroxysmal atrial fibrillation: Secondary | ICD-10-CM | POA: Diagnosis not present

## 2019-05-22 DIAGNOSIS — I878 Other specified disorders of veins: Secondary | ICD-10-CM | POA: Diagnosis present

## 2019-05-22 DIAGNOSIS — J441 Chronic obstructive pulmonary disease with (acute) exacerbation: Secondary | ICD-10-CM | POA: Diagnosis not present

## 2019-05-22 DIAGNOSIS — R0603 Acute respiratory distress: Secondary | ICD-10-CM

## 2019-05-22 DIAGNOSIS — E669 Obesity, unspecified: Secondary | ICD-10-CM

## 2019-05-22 DIAGNOSIS — I5043 Acute on chronic combined systolic (congestive) and diastolic (congestive) heart failure: Secondary | ICD-10-CM | POA: Diagnosis not present

## 2019-05-22 DIAGNOSIS — E1169 Type 2 diabetes mellitus with other specified complication: Secondary | ICD-10-CM

## 2019-05-22 DIAGNOSIS — Z79899 Other long term (current) drug therapy: Secondary | ICD-10-CM

## 2019-05-22 DIAGNOSIS — J80 Acute respiratory distress syndrome: Secondary | ICD-10-CM

## 2019-05-22 DIAGNOSIS — Z8249 Family history of ischemic heart disease and other diseases of the circulatory system: Secondary | ICD-10-CM

## 2019-05-22 DIAGNOSIS — J449 Chronic obstructive pulmonary disease, unspecified: Secondary | ICD-10-CM | POA: Diagnosis present

## 2019-05-22 DIAGNOSIS — E1122 Type 2 diabetes mellitus with diabetic chronic kidney disease: Secondary | ICD-10-CM | POA: Diagnosis not present

## 2019-05-22 DIAGNOSIS — R7309 Other abnormal glucose: Secondary | ICD-10-CM | POA: Diagnosis not present

## 2019-05-22 DIAGNOSIS — I10 Essential (primary) hypertension: Secondary | ICD-10-CM

## 2019-05-22 DIAGNOSIS — Z91041 Radiographic dye allergy status: Secondary | ICD-10-CM | POA: Diagnosis not present

## 2019-05-22 DIAGNOSIS — Z794 Long term (current) use of insulin: Secondary | ICD-10-CM

## 2019-05-22 DIAGNOSIS — R5381 Other malaise: Secondary | ICD-10-CM | POA: Diagnosis not present

## 2019-05-22 DIAGNOSIS — L89153 Pressure ulcer of sacral region, stage 3: Secondary | ICD-10-CM | POA: Diagnosis present

## 2019-05-22 DIAGNOSIS — N183 Chronic kidney disease, stage 3 unspecified: Secondary | ICD-10-CM

## 2019-05-22 DIAGNOSIS — D638 Anemia in other chronic diseases classified elsewhere: Secondary | ICD-10-CM | POA: Diagnosis present

## 2019-05-22 DIAGNOSIS — Z09 Encounter for follow-up examination after completed treatment for conditions other than malignant neoplasm: Secondary | ICD-10-CM

## 2019-05-22 DIAGNOSIS — N179 Acute kidney failure, unspecified: Secondary | ICD-10-CM | POA: Diagnosis not present

## 2019-05-22 DIAGNOSIS — R05 Cough: Secondary | ICD-10-CM | POA: Diagnosis not present

## 2019-05-22 DIAGNOSIS — R6521 Severe sepsis with septic shock: Secondary | ICD-10-CM | POA: Diagnosis not present

## 2019-05-22 DIAGNOSIS — J15212 Pneumonia due to Methicillin resistant Staphylococcus aureus: Secondary | ICD-10-CM | POA: Diagnosis not present

## 2019-05-22 DIAGNOSIS — Z9911 Dependence on respirator [ventilator] status: Secondary | ICD-10-CM | POA: Diagnosis not present

## 2019-05-22 DIAGNOSIS — E119 Type 2 diabetes mellitus without complications: Secondary | ICD-10-CM | POA: Diagnosis not present

## 2019-05-22 LAB — COMPREHENSIVE METABOLIC PANEL
ALT: 55 U/L — ABNORMAL HIGH (ref 0–44)
AST: 27 U/L (ref 15–41)
Albumin: 2 g/dL — ABNORMAL LOW (ref 3.5–5.0)
Alkaline Phosphatase: 107 U/L (ref 38–126)
Anion gap: 11 (ref 5–15)
BUN: 16 mg/dL (ref 8–23)
CO2: 25 mmol/L (ref 22–32)
Calcium: 8.5 mg/dL — ABNORMAL LOW (ref 8.9–10.3)
Chloride: 99 mmol/L (ref 98–111)
Creatinine, Ser: 1.37 mg/dL — ABNORMAL HIGH (ref 0.61–1.24)
GFR calc Af Amer: >60 mL/min (ref >60)
GFR calc non Af Amer: 53 mL/min — ABNORMAL LOW (ref >60)
Glucose, Bld: 229 mg/dL — ABNORMAL HIGH (ref 70–99)
Potassium: 2.9 mmol/L — ABNORMAL LOW (ref 3.5–5.1)
Sodium: 135 mmol/L (ref 135–145)
Total Bilirubin: 0.4 mg/dL (ref 0.3–1.2)
Total Protein: 6.6 g/dL (ref 6.5–8.1)

## 2019-05-22 LAB — CBC WITH DIFFERENTIAL/PLATELET
Abs Immature Granulocytes: 0.04 10*3/uL (ref 0.00–0.07)
Basophils Absolute: 0 10*3/uL (ref 0.0–0.1)
Basophils Relative: 0 %
Eosinophils Absolute: 0.1 10*3/uL (ref 0.0–0.5)
Eosinophils Relative: 2 %
HCT: 24.3 % — ABNORMAL LOW (ref 39.0–52.0)
Hemoglobin: 7.8 g/dL — ABNORMAL LOW (ref 13.0–17.0)
Immature Granulocytes: 1 %
Lymphocytes Relative: 13 %
Lymphs Abs: 0.8 10*3/uL (ref 0.7–4.0)
MCH: 29.9 pg (ref 26.0–34.0)
MCHC: 32.1 g/dL (ref 30.0–36.0)
MCV: 93.1 fL (ref 80.0–100.0)
Monocytes Absolute: 0.4 10*3/uL (ref 0.1–1.0)
Monocytes Relative: 7 %
Neutro Abs: 4.5 10*3/uL (ref 1.7–7.7)
Neutrophils Relative %: 77 %
Platelets: 218 10*3/uL (ref 150–400)
RBC: 2.61 MIL/uL — ABNORMAL LOW (ref 4.22–5.81)
RDW: 15.4 % (ref 11.5–15.5)
WBC: 5.9 10*3/uL (ref 4.0–10.5)
nRBC: 0 % (ref 0.0–0.2)

## 2019-05-22 LAB — MAGNESIUM: Magnesium: 1.9 mg/dL (ref 1.7–2.4)

## 2019-05-22 LAB — GLUCOSE, CAPILLARY
Glucose-Capillary: 222 mg/dL — ABNORMAL HIGH (ref 70–99)
Glucose-Capillary: 282 mg/dL — ABNORMAL HIGH (ref 70–99)
Glucose-Capillary: 221 mg/dL — ABNORMAL HIGH (ref 70–99)
Glucose-Capillary: 291 mg/dL — ABNORMAL HIGH (ref 70–99)

## 2019-05-22 LAB — PHOSPHORUS: Phosphorus: 3.9 mg/dL (ref 2.5–4.6)

## 2019-05-22 MED ORDER — INSULIN ASPART 100 UNIT/ML ~~LOC~~ SOLN
0.0000 [IU] | Freq: Three times a day (TID) | SUBCUTANEOUS | Status: DC
  Administered 2019-05-23: 5 [IU] via SUBCUTANEOUS
  Administered 2019-05-23 – 2019-05-24 (×3): 2 [IU] via SUBCUTANEOUS
  Administered 2019-05-24 (×2): 3 [IU] via SUBCUTANEOUS
  Administered 2019-05-25: 2 [IU] via SUBCUTANEOUS
  Administered 2019-05-25 – 2019-05-26 (×3): 3 [IU] via SUBCUTANEOUS
  Administered 2019-05-26 – 2019-05-27 (×3): 2 [IU] via SUBCUTANEOUS
  Administered 2019-05-27: 08:00:00 1 [IU] via SUBCUTANEOUS
  Administered 2019-05-27: 17:00:00 2 [IU] via SUBCUTANEOUS
  Administered 2019-05-28 (×2): 1 [IU] via SUBCUTANEOUS
  Administered 2019-05-28 – 2019-05-29 (×3): 2 [IU] via SUBCUTANEOUS
  Administered 2019-05-29: 3 [IU] via SUBCUTANEOUS
  Administered 2019-05-30 (×2): 2 [IU] via SUBCUTANEOUS
  Administered 2019-05-31: 3 [IU] via SUBCUTANEOUS
  Administered 2019-06-01 (×2): 1 [IU] via SUBCUTANEOUS
  Administered 2019-06-01: 2 [IU] via SUBCUTANEOUS
  Administered 2019-06-02: 1 [IU] via SUBCUTANEOUS
  Administered 2019-06-03: 14:00:00 2 [IU] via SUBCUTANEOUS
  Administered 2019-06-04: 1 [IU] via SUBCUTANEOUS
  Administered 2019-06-04 – 2019-06-05 (×2): 2 [IU] via SUBCUTANEOUS
  Administered 2019-06-05: 12:00:00 3 [IU] via SUBCUTANEOUS
  Administered 2019-06-06: 1 [IU] via SUBCUTANEOUS
  Administered 2019-06-06: 3 [IU] via SUBCUTANEOUS

## 2019-05-22 MED ORDER — INSULIN ASPART 100 UNIT/ML ~~LOC~~ SOLN
0.0000 [IU] | Freq: Every day | SUBCUTANEOUS | Status: DC
  Administered 2019-05-22: 3 [IU] via SUBCUTANEOUS
  Administered 2019-05-24 – 2019-05-29 (×5): 2 [IU] via SUBCUTANEOUS

## 2019-05-22 MED ORDER — INSULIN ASPART 100 UNIT/ML ~~LOC~~ SOLN
3.0000 [IU] | Freq: Three times a day (TID) | SUBCUTANEOUS | Status: DC
  Administered 2019-05-23 – 2019-06-05 (×39): 3 [IU] via SUBCUTANEOUS

## 2019-05-22 MED ORDER — POTASSIUM CHLORIDE CRYS ER 20 MEQ PO TBCR: 20.0000 meq | EXTENDED_RELEASE_TABLET | Freq: Two times a day (BID) | ORAL | Status: DC

## 2019-05-22 MED ORDER — TRAZODONE HCL 50 MG PO TABS
25.0000 mg | ORAL_TABLET | Freq: Every evening | ORAL | Status: DC | PRN
  Administered 2019-05-29: 50 mg via ORAL
  Filled 2019-05-22: qty 1

## 2019-05-22 MED ORDER — PROCHLORPERAZINE EDISYLATE 10 MG/2ML IJ SOLN: 5.0000 mg | Freq: Four times a day (QID) | INTRAMUSCULAR | Status: DC | PRN

## 2019-05-22 MED ORDER — FUROSEMIDE 40 MG PO TABS: 40.0000 mg | ORAL_TABLET | Freq: Every day | ORAL | 0 refills | Status: DC

## 2019-05-22 MED ORDER — ACARBOSE 25 MG PO TABS
100.0000 mg | ORAL_TABLET | Freq: Three times a day (TID) | ORAL | Status: DC
  Administered 2019-05-23 – 2019-06-06 (×43): 100 mg via ORAL
  Filled 2019-05-22 (×4): qty 4
  Filled 2019-05-22: qty 2
  Filled 2019-05-22 (×2): qty 4
  Filled 2019-05-22: qty 2
  Filled 2019-05-22 (×2): qty 4
  Filled 2019-05-22: qty 2
  Filled 2019-05-22 (×7): qty 4
  Filled 2019-05-22 (×5): qty 2
  Filled 2019-05-22: qty 4
  Filled 2019-05-22: qty 2
  Filled 2019-05-22 (×10): qty 4
  Filled 2019-05-22: qty 1
  Filled 2019-05-22 (×3): qty 4
  Filled 2019-05-22: qty 2
  Filled 2019-05-22 (×5): qty 4
  Filled 2019-05-22: qty 2
  Filled 2019-05-22 (×4): qty 4

## 2019-05-22 MED ORDER — FLEET ENEMA 7-19 GM/118ML RE ENEM: 1.0000 | ENEMA | Freq: Once | RECTAL | Status: DC | PRN

## 2019-05-22 MED ORDER — APIXABAN 5 MG PO TABS: 5.0000 mg | ORAL_TABLET | Freq: Two times a day (BID) | ORAL | 0 refills | Status: DC

## 2019-05-22 MED ORDER — NYSTATIN 100000 UNIT/GM EX POWD
Freq: Three times a day (TID) | CUTANEOUS | Status: DC
  Administered 2019-05-23 – 2019-06-06 (×41): via TOPICAL
  Administered 2019-06-06: 1 g via TOPICAL
  Filled 2019-05-22: qty 15

## 2019-05-22 MED ORDER — RESOURCE THICKENUP CLEAR PO POWD
ORAL | Status: DC | PRN
  Filled 2019-05-22 (×2): qty 125

## 2019-05-22 MED ORDER — PROCHLORPERAZINE 25 MG RE SUPP: 12.5000 mg | Freq: Four times a day (QID) | RECTAL | Status: DC | PRN

## 2019-05-22 MED ORDER — FUROSEMIDE 10 MG/ML IJ SOLN
40.0000 mg | Freq: Once | INTRAMUSCULAR | Status: AC
  Administered 2019-05-22: 21:00:00 40 mg via INTRAVENOUS

## 2019-05-22 MED ORDER — COLLAGENASE 250 UNIT/GM EX OINT
TOPICAL_OINTMENT | Freq: Every day | CUTANEOUS | Status: AC
  Administered 2019-05-23 – 2019-05-28 (×6): via TOPICAL
  Administered 2019-05-29: 1 via TOPICAL
  Administered 2019-05-30 – 2019-06-01 (×3): via TOPICAL
  Filled 2019-05-22: qty 30

## 2019-05-22 MED ORDER — GUAIFENESIN-DM 100-10 MG/5ML PO SYRP
5.0000 mL | ORAL_SOLUTION | Freq: Four times a day (QID) | ORAL | Status: DC | PRN
  Administered 2019-05-25 – 2019-05-28 (×4): 10 mL via ORAL
  Filled 2019-05-22 (×4): qty 10

## 2019-05-22 MED ORDER — POTASSIUM CHLORIDE CRYS ER 20 MEQ PO TBCR
40.0000 meq | EXTENDED_RELEASE_TABLET | Freq: Once | ORAL | Status: DC
  Administered 2019-05-22: 40 meq via ORAL
  Filled 2019-05-22: qty 2

## 2019-05-22 MED ORDER — TAMSULOSIN HCL 0.4 MG PO CAPS
0.8000 mg | ORAL_CAPSULE | Freq: Every day | ORAL | Status: DC
  Administered 2019-05-23 – 2019-06-06 (×15): 0.8 mg via ORAL
  Filled 2019-05-22 (×15): qty 2

## 2019-05-22 MED ORDER — ATORVASTATIN CALCIUM 80 MG PO TABS
80.0000 mg | ORAL_TABLET | Freq: Every day | ORAL | Status: DC
  Administered 2019-05-23 – 2019-06-06 (×15): 80 mg via ORAL
  Filled 2019-05-22 (×15): qty 1

## 2019-05-22 MED ORDER — ADULT MULTIVITAMIN W/MINERALS CH: 1.0000 | ORAL_TABLET | Freq: Every day | ORAL | Status: DC

## 2019-05-22 MED ORDER — DIPHENHYDRAMINE HCL 12.5 MG/5ML PO ELIX: 12.5000 mg | ORAL_SOLUTION | Freq: Four times a day (QID) | ORAL | Status: DC | PRN

## 2019-05-22 MED ORDER — ADULT MULTIVITAMIN W/MINERALS CH
1.0000 | ORAL_TABLET | Freq: Every day | ORAL | Status: DC
  Administered 2019-05-23 – 2019-06-06 (×15): 1 via ORAL
  Filled 2019-05-22 (×15): qty 1

## 2019-05-22 MED ORDER — MOMETASONE FURO-FORMOTEROL FUM 200-5 MCG/ACT IN AERO
2.0000 | INHALATION_SPRAY | Freq: Two times a day (BID) | RESPIRATORY_TRACT | Status: DC
  Administered 2019-05-23 – 2019-06-06 (×28): 2 via RESPIRATORY_TRACT
  Filled 2019-05-22: qty 8.8

## 2019-05-22 MED ORDER — POTASSIUM CHLORIDE CRYS ER 20 MEQ PO TBCR
40.0000 meq | EXTENDED_RELEASE_TABLET | Freq: Once | ORAL | Status: AC
  Administered 2019-05-22: 23:00:00 40 meq via ORAL
  Filled 2019-05-22: qty 2

## 2019-05-22 MED ORDER — BISACODYL 10 MG RE SUPP: 10.0000 mg | Freq: Every day | RECTAL | Status: DC | PRN

## 2019-05-22 MED ORDER — ALBUTEROL SULFATE (2.5 MG/3ML) 0.083% IN NEBU
2.5000 mg | INHALATION_SOLUTION | Freq: Four times a day (QID) | RESPIRATORY_TRACT | Status: DC | PRN
  Administered 2019-05-23 – 2019-06-01 (×2): 2.5 mg via RESPIRATORY_TRACT
  Filled 2019-05-22 (×2): qty 3

## 2019-05-22 MED ORDER — ENSURE MAX PROTEIN PO LIQD
11.0000 [oz_av] | Freq: Two times a day (BID) | ORAL | Status: DC
  Administered 2019-05-23 – 2019-06-06 (×25): 11 [oz_av] via ORAL
  Filled 2019-05-22 (×32): qty 330

## 2019-05-22 MED ORDER — FUROSEMIDE 40 MG PO TABS: 40.0000 mg | ORAL_TABLET | Freq: Every day | ORAL | Status: DC

## 2019-05-22 MED ORDER — POLYETHYLENE GLYCOL 3350 17 G PO PACK: 17.0000 g | PACK | Freq: Every day | ORAL | Status: DC | PRN

## 2019-05-22 MED ORDER — TAMSULOSIN HCL 0.4 MG PO CAPS: 0.8000 mg | ORAL_CAPSULE | Freq: Every day | ORAL | 0 refills | Status: DC

## 2019-05-22 MED ORDER — MUPIROCIN 2 % EX OINT
TOPICAL_OINTMENT | Freq: Two times a day (BID) | CUTANEOUS | Status: DC
  Administered 2019-05-23 – 2019-06-06 (×28): via NASAL
  Administered 2019-06-06: 1 via NASAL
  Filled 2019-05-22: qty 22

## 2019-05-22 MED ORDER — POTASSIUM CHLORIDE CRYS ER 20 MEQ PO TBCR
40.0000 meq | EXTENDED_RELEASE_TABLET | Freq: Once | ORAL | Status: AC
  Administered 2019-05-22: 40 meq via ORAL
  Filled 2019-05-22: qty 2

## 2019-05-22 MED ORDER — ZOLPIDEM TARTRATE 5 MG PO TABS
5.0000 mg | ORAL_TABLET | Freq: Once | ORAL | Status: AC
  Administered 2019-05-22: 5 mg via ORAL
  Filled 2019-05-22: qty 1

## 2019-05-22 MED ORDER — METOPROLOL TARTRATE 50 MG PO TABS
50.0000 mg | ORAL_TABLET | Freq: Two times a day (BID) | ORAL | Status: DC
  Administered 2019-05-22 – 2019-06-06 (×31): 50 mg via ORAL
  Filled 2019-05-22 (×31): qty 1

## 2019-05-22 MED ORDER — POTASSIUM CHLORIDE CRYS ER 20 MEQ PO TBCR
20.0000 meq | EXTENDED_RELEASE_TABLET | Freq: Two times a day (BID) | ORAL | Status: DC
  Administered 2019-05-22: 22:00:00 20 meq via ORAL
  Filled 2019-05-22: qty 1

## 2019-05-22 MED ORDER — PRO-STAT SUGAR FREE PO LIQD
30.0000 mL | Freq: Two times a day (BID) | ORAL | Status: DC
  Administered 2019-05-23 – 2019-06-03 (×10): 30 mL via ORAL
  Filled 2019-05-22 (×24): qty 30

## 2019-05-22 MED ORDER — FOLIC ACID 1 MG PO TABS
1.0000 mg | ORAL_TABLET | Freq: Every day | ORAL | Status: DC
  Administered 2019-05-23 – 2019-06-06 (×15): 1 mg via ORAL
  Filled 2019-05-22 (×15): qty 1

## 2019-05-22 MED ORDER — APIXABAN 5 MG PO TABS
5.0000 mg | ORAL_TABLET | Freq: Two times a day (BID) | ORAL | Status: DC
  Administered 2019-05-22 – 2019-06-06 (×31): 5 mg via ORAL
  Filled 2019-05-22 (×31): qty 1

## 2019-05-22 MED ORDER — METOPROLOL TARTRATE 50 MG PO TABS: 50.0000 mg | ORAL_TABLET | Freq: Two times a day (BID) | ORAL | 0 refills | Status: DC

## 2019-05-22 MED ORDER — PROCHLORPERAZINE MALEATE 5 MG PO TABS: 5.0000 mg | ORAL_TABLET | Freq: Four times a day (QID) | ORAL | Status: DC | PRN

## 2019-05-22 MED ORDER — GABAPENTIN 100 MG PO CAPS
100.0000 mg | ORAL_CAPSULE | Freq: Three times a day (TID) | ORAL | Status: DC
  Administered 2019-05-22 – 2019-05-27 (×14): 100 mg via ORAL
  Filled 2019-05-22 (×15): qty 1

## 2019-05-22 MED ORDER — ALUM & MAG HYDROXIDE-SIMETH 200-200-20 MG/5ML PO SUSP
30.0000 mL | ORAL | Status: DC | PRN
  Administered 2019-05-25: 30 mL via ORAL
  Filled 2019-05-22: qty 30

## 2019-05-22 MED ORDER — ENSURE ENLIVE PO LIQD: 237.0000 mL | Freq: Three times a day (TID) | ORAL | 0 refills | Status: DC

## 2019-05-22 MED ORDER — ALPRAZOLAM 0.5 MG PO TABS
0.5000 mg | ORAL_TABLET | Freq: Three times a day (TID) | ORAL | Status: DC | PRN
  Administered 2019-05-22 – 2019-05-23 (×2): 0.5 mg via ORAL
  Filled 2019-05-22 (×2): qty 1

## 2019-05-22 MED ORDER — ACETAMINOPHEN 325 MG PO TABS
325.0000 mg | ORAL_TABLET | ORAL | Status: DC | PRN
  Administered 2019-05-23 – 2019-06-06 (×10): 650 mg via ORAL
  Filled 2019-05-22 (×10): qty 2

## 2019-05-22 MED ORDER — CLOPIDOGREL BISULFATE 75 MG PO TABS
75.0000 mg | ORAL_TABLET | Freq: Every day | ORAL | Status: DC
  Administered 2019-05-23 – 2019-06-06 (×15): 75 mg via ORAL
  Filled 2019-05-22 (×15): qty 1

## 2019-05-22 MED ORDER — FUROSEMIDE 10 MG/ML IJ SOLN
INTRAMUSCULAR | Status: AC
  Administered 2019-05-22: 21:00:00 40 mg via INTRAVENOUS
  Filled 2019-05-22: qty 4

## 2019-05-22 NOTE — H&P (Signed)
Physical Medicine and Rehabilitation Admission H&P    Chief Complaint  Patient presents with   Debility    HPI: Robert [name] is a 68 year old male with history of CAD with ICM, PAD, SCI with LLE weakness?, T2DM, RA- on Methotrexate/actemra, anxiety disorder, COPD who was originally admitted on 04/12/19 with severe SOB, fever and cough. He was intubated in ED and found to be positive for COVID 19 and was Remdesivir and Actemra as well as steroids and broad spectrum antibiotics for MRSA PNA. [name]  Hospital course significant for  COPD exacerbation, septic shock due to MRSA PNA, AKI with electrolyte abnormality, acute urinary retention with hematuria--foley in place, as well as development of sacral decub. He tolerated extubation by 05/03/19 but had worsening of hypoxia with increase WOB, A fib with RVR and found to have rise in trop -1,269 due to demand ischemia from recurrent aspiration as well as CHF.  He was started on IV heparin, treated with Unasyn X 5 days as well as IV lasix due to acute on chronic CHF.   He continued to require HF oxygen and CIR consulted due to patient's debilitated stage and has been following for medical stability.  On 05/19/19, he developed hypoxemic respiratory failure requiring re-intubation and A fib with RVR. He was transferred to [facility] 09/08 due to concerns of need for cardiac intervention.  He was treated with IV BB as well as IV diuresis with improvement and tolerated extubation later that day. As troponin trending down without signs of ACS, Cardiology recommended that CAD to be treated medically and he was transitioned to Eliquis 9/9.  Beside swallow done and patient started on dysphagia 2, nectar liquids. Respiratory status stable and he was cleared to start CIR.     Review of Systems  HENT: Positive for hearing loss. Negative for ear pain and tinnitus.   Eyes: Negative for blurred vision and double vision.  Respiratory: Positive for wheezing. Negative for cough,  sputum production and shortness of breath.   Cardiovascular: Negative for chest pain and palpitations.  Musculoskeletal: Positive for joint pain (bilateral hips and knees). Negative for myalgias and neck pain.  Neurological: Positive for dizziness and sensory change (numb and tingling bilateral feet). Negative for tingling and headaches.  Psychiatric/Behavioral: The patient is nervous/anxious (anxious about starting rehab) and has insomnia.     Past Medical History:  Diagnosis Date   Anxiety    COPD (chronic obstructive pulmonary disease) (HCC)    Coronary artery disease    Full dentures    GERD (gastroesophageal reflux disease)    Rolaids as needed   High cholesterol    History of MI (myocardial infarction)    Hypertension    med. dosage increased 04/2016; has been on BP med. x 10 yrs.   Incarcerated umbilical hernia 0000000   Inguinal hernia 05/2016   bilateral    Insulin dependent diabetes mellitus ([place])    Ischemic cardiomyopathy    a. 03/2015 EF 40-45% by LV gram.   Left leg cellulitis 10/08/2016   Rheumatoid arthritis(714.0)     Past Surgical History:  Procedure Laterality Date   CARDIAC CATHETERIZATION N/A 02/01/2015   Procedure: Left Heart Cath and Coronary Angiography;  Surgeon: Belva Crome, MD;  Location: [place] CV LAB;  Service: Cardiovascular;  Laterality: N/A;   CARDIAC CATHETERIZATION N/A 03/22/2015   Procedure: Left Heart Cath and Coronary Angiography;  Surgeon: Leonie Man, MD;  Location: [place] CV LAB;  Service: Cardiovascular;  Laterality: N/A;   CARDIAC CATHETERIZATION  09/05/2002; 03/09/2003; 04/10/2005;06/02/2005; 05/09/2006; 02/09/2007; 03/01/2007   COLONOSCOPY  2008   Dr. Gala Romney: diverticulosis, hyperplastic polyp.   CORONARY ANGIOPLASTY  11/14/2012; 02/01/2015   CORONARY ANGIOPLASTY WITH STENT PLACEMENT  05/16/2012   "1; makes total ~ 4"   CORONARY STENT INTERVENTION N/A 06/06/2017   Procedure: CORONARY STENT INTERVENTION;   Surgeon: Belva Crome, MD;  Location: [place] CV LAB;  Service: Cardiovascular;  Laterality: N/A;   INSERTION OF MESH Bilateral 05/24/2016   Procedure: INSERTION OF MESH;  Surgeon: Coralie Keens, MD;  Location: [place];  Service: General;  Laterality: Bilateral;   LAPAROSCOPIC CHOLECYSTECTOMY     LAPAROSCOPIC INGUINAL HERNIA WITH UMBILICAL HERNIA Bilateral 05/24/2016   Procedure: BILATERAL LAPAROSCOPIC INGUINAL HERNIA REPAIR WITH MESH AND UMBILICAL HERNIA REPAIR WITH MESH;  Surgeon: Coralie Keens, MD;  Location: [place];  Service: General;  Laterality: Bilateral;   LEFT HEART CATH AND CORONARY ANGIOGRAPHY N/A 06/06/2017   Procedure: LEFT HEART CATH AND CORONARY ANGIOGRAPHY;  Surgeon: Belva Crome, MD;  Location: [place] CV LAB;  Service: Cardiovascular;  Laterality: N/A;   LEFT HEART CATHETERIZATION WITH CORONARY ANGIOGRAM N/A 12/22/2011   Procedure: LEFT HEART CATHETERIZATION WITH CORONARY ANGIOGRAM;  Surgeon: Troy Sine, MD;  Location: [facility] CATH LAB;  Service: Cardiovascular;  Laterality: N/A;   LEFT HEART CATHETERIZATION WITH CORONARY ANGIOGRAM Bilateral 05/16/2012   Procedure: LEFT HEART CATHETERIZATION WITH CORONARY ANGIOGRAM;  Surgeon: Lorretta Harp, MD;  Location: [facility] CATH LAB;  Service: Cardiovascular;  Laterality: Bilateral;   LEFT HEART CATHETERIZATION WITH CORONARY ANGIOGRAM N/A 11/14/2012   Procedure: LEFT HEART CATHETERIZATION WITH CORONARY ANGIOGRAM;  Surgeon: Troy Sine, MD;  Location: [facility] CATH LAB;  Service: Cardiovascular;  Laterality: N/A;   LEFT HEART CATHETERIZATION WITH CORONARY ANGIOGRAM N/A 09/01/2013   Procedure: LEFT HEART CATHETERIZATION WITH CORONARY ANGIOGRAM;  Surgeon: Leonie Man, MD;  Location: [facility] CATH LAB;  Service: Cardiovascular;  Laterality: N/A;   LUMBAR LAMINECTOMY  1972; 1973; 1985   x [place]  08/30/2009   No significant ischemia   OPEN REDUCTION INTERNAL FIXATION  (ORIF) DISTAL RADIAL FRACTURE Right 12/10/2016   Procedure: OPEN REDUCTION INTERNAL FIXATION (ORIF) DISTAL RADIAL FRACTURE;  Surgeon: Iran Planas, MD;  Location: [place];  Service: Orthopedics;  Laterality: Right;   PERCUTANEOUS CORONARY INTERVENTION-BALLOON ONLY  12/22/2011   Procedure: PERCUTANEOUS CORONARY INTERVENTION-BALLOON ONLY;  Surgeon: Troy Sine, MD;  Location: [facility] CATH LAB;  Service: Cardiovascular;;   PERCUTANEOUS CORONARY INTERVENTION-BALLOON ONLY  11/14/2012   Procedure: PERCUTANEOUS CORONARY INTERVENTION-BALLOON ONLY;  Surgeon: Troy Sine, MD;  Location: [facility] CATH LAB;  Service: Cardiovascular;;   PERCUTANEOUS CORONARY STENT INTERVENTION (PCI-S)  05/16/2012   Procedure: PERCUTANEOUS CORONARY STENT INTERVENTION (PCI-S);  Surgeon: Lorretta Harp, MD;  Location: [facility] CATH LAB;  Service: Cardiovascular;;   PERCUTANEOUS CORONARY STENT INTERVENTION (PCI-S)  09/01/2013   Procedure: PERCUTANEOUS CORONARY STENT INTERVENTION (PCI-S);  Surgeon: Leonie Man, MD;  Location: [facility] CATH LAB;  Service: Cardiovascular;;    Family History  Problem Relation Age of Onset   Heart disease Father    Colon cancer Brother        early 84s   Hypertension Mother    Heart disease Mother    Colon cancer Mother        died age 37. late 12s early 22s at diagnosis.    Lung cancer Mother     Social History:  Widowed--wife passed away 5/20. Lives with son (does not work) and daughter brings meals/helps with home. He was independent with cane PTA. He reports that he used to smoke 1.5 PPD but quit prior to hospitalization (due to dyspnea/pressure).  He has never used smokeless tobacco. He reports that he does not drink alcohol or use drugs.    Allergies  Allergen Reactions   Ivp Dye [Iodinated Diagnostic Agents] Itching and Rash    Medications Prior to Admission  Medication Sig Dispense Refill   acarbose (PRECOSE) 100 MG tablet Take 100 mg by mouth 3 (three) times daily with meals.       acetaminophen (TYLENOL) 650 MG CR tablet Take 650 mg by mouth every 8 (eight) hours as needed for pain.     ALPRAZolam (XANAX) 0.5 MG tablet Take 0.5 mg by mouth 3 (three) times daily as needed for anxiety.      AMBULATORY NON FORMULARY MEDICATION Take 600 mg by mouth daily at 12 noon. Medication Name: Dalcetrapib vs placebo (Patient taking differently: Take 600 mg by mouth every evening. Medication Name: Dalcetrapib vs placebo: Cholesterol)     aspirin EC 81 MG EC tablet Take 1 tablet (81 mg total) by mouth daily.     atorvastatin (LIPITOR) 80 MG tablet TAKE 1 TABLET DAILY AT 6 PM. (Patient taking differently: Take 80 mg by mouth daily at 6 PM. ) 90 tablet 3   clopidogrel (PLAVIX) 75 MG tablet TAKE (1) TABLET BY MOUTH ONCE DAILY. 90 tablet 0   fish oil-omega-3 fatty acids 1000 MG capsule Take 1 g by mouth daily.      folic acid (FOLVITE) 1 MG tablet Take 1 mg by mouth daily.      furosemide (LASIX) 40 MG tablet Take 80mg  po bid for 5 days then 80mg  po daily (Patient taking differently: Take 80 mg by mouth daily. ) 180 tablet 3   gabapentin (NEURONTIN) 300 MG capsule Take 300 mg by mouth 3 (three) times daily as needed (for pain).      glipiZIDE (GLUCOTROL) 10 MG tablet Take 10 mg by mouth 2 (two) times daily.     Insulin Glargine (TOUJEO SOLOSTAR [place]) Inject 80 Units into the skin daily.     isosorbide mononitrate (IMDUR) 60 MG 24 hr tablet Take 1 tablet (60 mg total) by mouth daily. 90 tablet 1   losartan (COZAAR) 25 MG tablet Take 0.5 tablets (12.5 mg total) by mouth daily. 30 tablet 6   metFORMIN (GLUCOPHAGE) 1000 MG tablet Take 1 tablet (1,000 mg total) by mouth 2 (two) times daily with a meal. (Patient taking differently: Take 1,000-1,250 mg by mouth See admin instructions. Take 1500 mg by mouth in the morning and 1000 mg in the evening)     methotrexate 50 MG/2ML injection Inject 2 mLs into the skin once a week. Takes every Thursday     metoprolol tartrate (LOPRESSOR) 25 MG  tablet TAKE 1 TABLET TWICE DAILY. KEEP JULY APPOINTMENT. (Patient taking differently: Take 12.5 mg by mouth 2 (two) times daily. ) 180 tablet 0   nitroGLYCERIN (NITROSTAT) 0.4 MG SL tablet Place 1 tablet (0.4 mg total) under the tongue every 5 (five) minutes x 3 doses as needed for chest pain. 25 tablet 2   potassium chloride SA (K-DUR) 20 MEQ tablet 1 TABLET BY MOUTH DAILY. 30 tablet 1   predniSONE (DELTASONE) 5 MG tablet Take 5 mg by mouth as directed. Filled on 04-10-19 # 48 dose pak ...     primidone (  MYSOLINE) 50 MG tablet Take 100 mg by mouth daily.      Tocilizumab 162 MG/0.9ML SOSY Inject 162 mg into the skin every Monday. ACTEMRA      Drug Regimen Review  Drug regimen was reviewed and remains appropriate with no significant issues identified  Home: Home Living Family/patient expects to be discharged to:: Private residence Living Arrangements: Spouse/significant other, Children Available Help at Discharge: Family Type of Home: House Home Access: Stairs to enter Technical brewer of Steps: 3 Entrance Stairs-Rails: Left Home Layout: Multi-level, Able to live on main level with bedroom/bathroom Bathroom Shower/Tub: Chiropodist: Handicapped height Home Equipment: [place] - single point Additional Comments: info  from previous encounter   Functional History: Prior Function Level of Independence: Independent  Functional Status:  Mobility: Bed Mobility Overal bed mobility: Needs Assistance Bed Mobility: Supine to Sit, Sit to Supine Rolling: Supervision Sidelying to sit: Min assist Supine to sit: Min guard, HOB elevated(Assist for lines/safety) Sit to supine: Min assist Sit to sidelying: Min guard, Min assist General bed mobility comments: Assist to bring legs back up in bed Transfers Overall transfer level: Needs assistance Equipment used: Rolling walker (2 wheeled) Transfer via Lift Equipment: Stedy Transfers: Sit to/from Stand Sit to Stand:  +2 physical assistance, Mod assist, From elevated surface General transfer comment: Assist to bring hips up and for balance. Pt braces posterior legs against bed Ambulation/Gait Ambulation/Gait assistance: +2 physical assistance, Mod assist Gait Distance (Feet): 2 Feet Assistive device: Rolling walker (2 wheeled) Gait Pattern/deviations: Step-to pattern, Decreased step length - right, Decreased step length - left General Gait Details: Assist for balance and support. Pt side stepping up side of bed with posterior legs supported against bed. Knees in hyperextension Gait velocity: decr Gait velocity interpretation: <1.31 ft/sec, indicative of household ambulator    ADL: ADL Overall ADL's : Needs assistance/impaired Eating/Feeding: Supervision/ safety, Set up Eating/Feeding Details (indicate cue type and reason): honey thick liquids Grooming: Minimal assistance, Sitting Grooming Details (indicate cue type and reason): difficulty reaching hair Upper Body Bathing: Minimal assistance, Sitting Lower Body Bathing: Maximal assistance, Sit to/from stand Lower Body Bathing Details (indicate cue type and reason): Pt ablet o reach and clean groin area; would most likely benefit form long handled sponge Upper Body Dressing : Moderate assistance, Sitting Lower Body Dressing: Maximal assistance, Sit to/from stand Lower Body Dressing Details (indicate cue type and reason): assist to adjust socks Toileting- Clothing Manipulation and Hygiene: Total assistance Toileting - Clothing Manipulation Details (indicate cue type and reason): condom cath placed. Pt excited about having foley removed Functional mobility during ADLs: +2 for physical assistance, Moderate assistance, Maximal assistance(using Stedy) General ADL Comments: generalized weakness affecting ADL  Cognition: Cognition Overall Cognitive Status: Impaired/Different from baseline Orientation Level: Oriented X4 Cognition Arousal/Alertness:  Awake/alert Behavior During Therapy: WFL for tasks assessed/performed Overall Cognitive Status: Impaired/Different from baseline Area of Impairment: Memory, Safety/judgement, Awareness, Problem solving Current Attention Level: Selective Memory: Decreased short-term memory, Decreased recall of precautions Safety/Judgement: Decreased awareness of safety, Decreased awareness of deficits Awareness: Emergent Problem Solving: Slow processing, Requires verbal cues General Comments: Pt with improving cognition Difficult to assess due to: Intubated   Blood pressure (!) 141/74, pulse 86, temperature 98.1 F (36.7 C), temperature source Oral, resp. rate 18, height 6' (1.829 m), weight 106.1 kg, SpO2 93 %. Physical Exam  Nursing note and vitals reviewed. Constitutional: He is oriented to person, place, and time. He appears well-developed and well-nourished. No distress.  HENT:  Head: Normocephalic and  atraumatic.  Eyes: Pupils are equal, round, and reactive to light.  Neck: Normal range of motion.  Cardiovascular: Normal rate and regular rhythm.  No murmur heard. Respiratory: Effort normal. No stridor. He has wheezes (intermittent audible wheezes with increased WOB with activity in bed. ).  O2 via [place]  GI: Soft. Bowel sounds are normal. He exhibits distension.  Musculoskeletal:        General: No deformity or edema.     Comments: LLE with min edema and stasis changes on shin with dry flaky scab.   Neurological: He is alert and oriented to person, place, and time. No cranial nerve deficit.  Reasonable insight and awareness. Functional memory. Normal language. UE 3+ prox to 4/5 distally. RLE: 2+HF, 4-KE and 4/5 ADF/PF. LLE: 2/5 HF, 2+ KE and 2-/5 ADF/PF.   Skin: Skin is warm. No rash noted. He is not diaphoretic. No erythema.  Psychiatric: He has a normal mood and affect. His behavior is normal.  multiple decubiti noted--largest on right buttock      Results for orders placed or performed  during the hospital encounter of 04/11/19 (from the past 48 hour(s))  Glucose, capillary     Status: None   Collection Time: 05/20/19  4:59 PM  Result Value Ref Range   Glucose-Capillary 90 70 - 99 mg/dL  Heparin level (unfractionated)     Status: None   Collection Time: 05/20/19  8:20 PM  Result Value Ref Range   Heparin Unfractionated 0.40 0.30 - 0.70 IU/mL    Comment: (NOTE) If heparin results are below expected values, and patient dosage has  been confirmed, suggest follow up testing of antithrombin III levels. Performed at [facility], [place] [address] [place], [place] [address]   Glucose, capillary     Status: None   Collection Time: 05/20/19  9:33 PM  Result Value Ref Range   Glucose-Capillary 71 70 - 99 mg/dL  Magnesium     Status: None   Collection Time: 05/21/19  3:40 AM  Result Value Ref Range   Magnesium 2.2 1.7 - 2.4 mg/dL    Comment: Performed at [facility], [place] [address], [place], [place] [address]  Comprehensive metabolic panel     Status: Abnormal   Collection Time: 05/21/19  3:40 AM  Result Value Ref Range   Sodium 140 135 - 145 mmol/L   Potassium 4.3 3.5 - 5.1 mmol/L   Chloride 100 98 - 111 mmol/L   CO2 29 22 - 32 mmol/L   Glucose, Bld 85 70 - 99 mg/dL   BUN 26 (H) 8 - 23 mg/dL   Creatinine, Ser 1.59 (H) 0.61 - 1.24 mg/dL   Calcium 9.0 8.9 - 10.3 mg/dL   Total Protein 6.7 6.5 - 8.1 g/dL   Albumin 2.3 (L) 3.5 - 5.0 g/dL   AST 36 15 - 41 U/L   ALT 63 (H) 0 - 44 U/L   Alkaline Phosphatase 111 38 - 126 U/L   Total Bilirubin 0.5 0.3 - 1.2 mg/dL   GFR calc non Af Amer 45 (L) >60 mL/min   GFR calc Af Amer 52 (L) >60 mL/min   Anion gap 11 5 - 15    Comment: Performed at [facility], [place] [address], [place],  [address]  CBC with Differential/Platelet     Status: Abnormal   Collection Time: 05/21/19  3:40 AM  Result Value Ref Range   WBC 7.2 4.0 - 10.5 K/uL   RBC  2.87 (L) 4.22 - 5.81 MIL/uL     Hemoglobin 8.5 (L) 13.0 - 17.0 g/dL   HCT 27.2 (L) 39.0 - 52.0 %   MCV 94.8 80.0 - 100.0 fL   MCH 29.6 26.0 - 34.0 pg   MCHC 31.3 30.0 - 36.0 g/dL   RDW 15.9 (H) 11.5 - 15.5 %   Platelets 210 150 - 400 K/uL   nRBC 0.0 0.0 - 0.2 %   Neutrophils Relative % 71 %   Neutro Abs 5.1 1.7 - 7.7 K/uL   Lymphocytes Relative 17 %   Lymphs Abs 1.3 0.7 - 4.0 K/uL   Monocytes Relative 7 %   Monocytes Absolute 0.5 0.1 - 1.0 K/uL   Eosinophils Relative 4 %   Eosinophils Absolute 0.3 0.0 - 0.5 K/uL   Basophils Relative 0 %   Basophils Absolute 0.0 0.0 - 0.1 K/uL   Immature Granulocytes 1 %   Abs Immature Granulocytes 0.05 0.00 - 0.07 K/uL    Comment: Performed at [facility], [place] [address], [place], [place] [address]  Phosphorus     Status: Abnormal   Collection Time: 05/21/19  3:40 AM  Result Value Ref Range   Phosphorus 5.1 (H) 2.5 - 4.6 mg/dL    Comment: Performed at [facility], [place] [address], [place], [place] [address]  Heparin level (unfractionated)     Status: None   Collection Time: 05/21/19  3:40 AM  Result Value Ref Range   Heparin Unfractionated 0.49 0.30 - 0.70 IU/mL    Comment: (NOTE) If heparin results are below expected values, and patient dosage has  been confirmed, suggest follow up testing of antithrombin III levels. Performed at [facility], [place] [address], [place], [place] [address]   Glucose, capillary     Status: Abnormal   Collection Time: 05/21/19  7:39 AM  Result Value Ref Range   Glucose-Capillary 126 (H) 70 - 99 mg/dL  Glucose, capillary     Status: Abnormal   Collection Time: 05/21/19 11:40 AM  Result Value Ref Range   Glucose-Capillary 180 (H) 70 - 99 mg/dL  Glucose, capillary     Status: Abnormal   Collection Time: 05/21/19  4:30 PM  Result Value Ref Range   Glucose-Capillary 239 (H) 70 - 99 mg/dL  Glucose, capillary     Status: Abnormal   Collection Time: 05/21/19  9:17 PM  Result Value Ref  Range   Glucose-Capillary 302 (H) 70 - 99 mg/dL  Magnesium     Status: None   Collection Time: 05/22/19  4:07 AM  Result Value Ref Range   Magnesium 1.9 1.7 - 2.4 mg/dL    Comment: Performed at [facility], [place] [address], [place], [place] [address]  Comprehensive metabolic panel     Status: Abnormal   Collection Time: 05/22/19  4:07 AM  Result Value Ref Range   Sodium 135 135 - 145 mmol/L   Potassium 2.9 (L) 3.5 - 5.1 mmol/L   Chloride 99 98 - 111 mmol/L   CO2 25 22 - 32 mmol/L   Glucose, Bld 229 (H) 70 - 99 mg/dL   BUN 16 8 - 23 mg/dL   Creatinine, Ser 1.37 (H) 0.61 - 1.24 mg/dL   Calcium 8.5 (L) 8.9 - 10.3 mg/dL   Total Protein 6.6 6.5 - 8.1 g/dL   Albumin 2.0 (L) 3.5 - 5.0 g/dL   AST 27 15 - 41 U/L   ALT 55 (H) 0 -  44 U/L   Alkaline Phosphatase 107 38 - 126 U/L   Total Bilirubin 0.4 0.3 - 1.2 mg/dL   GFR calc non Af Amer 53 (L) >60 mL/min   GFR calc Af Amer >60 >60 mL/min   Anion gap 11 5 - 15    Comment: Performed at [place] [address] [place], [place] [address]  CBC with Differential/Platelet     Status: Abnormal   Collection Time: 05/22/19  4:07 AM  Result Value Ref Range   WBC 5.9 4.0 - 10.5 K/uL   RBC 2.61 (L) 4.22 - 5.81 MIL/uL   Hemoglobin 7.8 (L) 13.0 - 17.0 g/dL   HCT 24.3 (L) 39.0 - 52.0 %   MCV 93.1 80.0 - 100.0 fL   MCH 29.9 26.0 - 34.0 pg   MCHC 32.1 30.0 - 36.0 g/dL   RDW 15.4 11.5 - 15.5 %   Platelets 218 150 - 400 K/uL   nRBC 0.0 0.0 - 0.2 %   Neutrophils Relative % 77 %   Neutro Abs 4.5 1.7 - 7.7 K/uL   Lymphocytes Relative 13 %   Lymphs Abs 0.8 0.7 - 4.0 K/uL   Monocytes Relative 7 %   Monocytes Absolute 0.4 0.1 - 1.0 K/uL   Eosinophils Relative 2 %   Eosinophils Absolute 0.1 0.0 - 0.5 K/uL   Basophils Relative 0 %   Basophils Absolute 0.0 0.0 - 0.1 K/uL   Immature Granulocytes 1 %   Abs Immature Granulocytes 0.04 0.00 - 0.07 K/uL    Comment: Performed at [place] [address] [place], [place] [address]    Phosphorus     Status: None   Collection Time: 05/22/19  4:07 AM  Result Value Ref Range   Phosphorus 3.9 2.5 - 4.6 mg/dL    Comment: Performed at [place] [address] [place], [place] [address]  Glucose, capillary     Status: Abnormal   Collection Time: 05/22/19  7:33 AM  Result Value Ref Range   Glucose-Capillary 222 (H) 70 - 99 mg/dL  Glucose, capillary     Status: Abnormal   Collection Time: 05/22/19 11:56 AM  Result Value Ref Range   Glucose-Capillary 282 (H) 70 - 99 mg/dL   Dg Swallowing Func-speech Pathology  Result Date: 05/22/2019 Objective Swallowing Evaluation: Type of Study: MBS-Modified Barium Swallow Study  Patient Details Name: Robert [name] MRN: [id number] Date of Birth: [birth year] Today's Date: 05/22/2019 Time: SLP Start Time (ACUTE ONLY): 1008 -SLP Stop Time (ACUTE ONLY): 1026 SLP Time Calculation (min) (ACUTE ONLY): 18 min Past Medical History: Past Medical History: Diagnosis Date  Anxiety   COPD (chronic obstructive pulmonary disease) (HCC)   Coronary artery disease   Full dentures   GERD (gastroesophageal reflux disease)   Rolaids as needed  High cholesterol   History of MI (myocardial infarction)   Hypertension   med. dosage increased 04/2016; has been on BP med. x 10 yrs.  Incarcerated umbilical hernia 0000000  Inguinal hernia 05/2016  bilateral   Insulin dependent diabetes mellitus ([place])   Ischemic cardiomyopathy   a. 03/2015 EF 40-45% by LV gram.  Left leg cellulitis 10/08/2016  Rheumatoid arthritis(714.0)  Past Surgical History: Past Surgical History: Procedure Laterality Date  CARDIAC CATHETERIZATION N/A 02/01/2015  Procedure: Left Heart Cath and Coronary Angiography;  Surgeon: Belva Crome, MD;  Location: [place] CV LAB;  Service: Cardiovascular;  Laterality: N/A;  CARDIAC CATHETERIZATION N/A 03/22/2015  Procedure: Left Heart  Cath and Coronary Angiography;  Surgeon: Leonie Man, MD;  Location: [place] [facility] LAB;  Service: Cardiovascular;   Laterality: N/A;  CARDIAC CATHETERIZATION  09/05/2002; 03/09/2003; 04/10/2005;06/02/2005; 05/09/2006; 02/09/2007; 03/01/2007  COLONOSCOPY  2008  Dr. Gala Romney: diverticulosis, hyperplastic polyp.  CORONARY ANGIOPLASTY  11/14/2012; 02/01/2015  CORONARY ANGIOPLASTY WITH STENT PLACEMENT  05/16/2012  "1; makes total ~ 4"  CORONARY STENT INTERVENTION N/A 06/06/2017  Procedure: CORONARY STENT INTERVENTION;  Surgeon: Belva Crome, MD;  Location: [place] CV LAB;  Service: Cardiovascular;  Laterality: N/A;  INSERTION OF MESH Bilateral 05/24/2016  Procedure: INSERTION OF MESH;  Surgeon: Coralie Keens, MD;  Location: [place];  Service: General;  Laterality: Bilateral;  LAPAROSCOPIC CHOLECYSTECTOMY    LAPAROSCOPIC INGUINAL HERNIA WITH UMBILICAL HERNIA Bilateral 05/24/2016  Procedure: BILATERAL LAPAROSCOPIC INGUINAL HERNIA REPAIR WITH MESH AND UMBILICAL HERNIA REPAIR WITH MESH;  Surgeon: Coralie Keens, MD;  Location: [place];  Service: General;  Laterality: Bilateral;  LEFT HEART CATH AND CORONARY ANGIOGRAPHY N/A 06/06/2017  Procedure: LEFT HEART CATH AND CORONARY ANGIOGRAPHY;  Surgeon: Belva Crome, MD;  Location: [place] CV LAB;  Service: Cardiovascular;  Laterality: N/A;  LEFT HEART CATHETERIZATION WITH CORONARY ANGIOGRAM N/A 12/22/2011  Procedure: LEFT HEART CATHETERIZATION WITH CORONARY ANGIOGRAM;  Surgeon: Troy Sine, MD;  Location: [facility] CATH LAB;  Service: Cardiovascular;  Laterality: N/A;  LEFT HEART CATHETERIZATION WITH CORONARY ANGIOGRAM Bilateral 05/16/2012  Procedure: LEFT HEART CATHETERIZATION WITH CORONARY ANGIOGRAM;  Surgeon: Lorretta Harp, MD;  Location: [facility] CATH LAB;  Service: Cardiovascular;  Laterality: Bilateral;  LEFT HEART CATHETERIZATION WITH CORONARY ANGIOGRAM N/A 11/14/2012  Procedure: LEFT HEART CATHETERIZATION WITH CORONARY ANGIOGRAM;  Surgeon: Troy Sine, MD;  Location: [facility] CATH LAB;  Service: Cardiovascular;  Laterality: N/A;  LEFT HEART  CATHETERIZATION WITH CORONARY ANGIOGRAM N/A 09/01/2013  Procedure: LEFT HEART CATHETERIZATION WITH CORONARY ANGIOGRAM;  Surgeon: Leonie Man, MD;  Location: [facility] CATH LAB;  Service: Cardiovascular;  Laterality: N/A;  LUMBAR LAMINECTOMY  1972; 1973; 1985  x [place]  08/30/2009  No significant ischemia  OPEN REDUCTION INTERNAL FIXATION (ORIF) DISTAL RADIAL FRACTURE Right 12/10/2016  Procedure: OPEN REDUCTION INTERNAL FIXATION (ORIF) DISTAL RADIAL FRACTURE;  Surgeon: Iran Planas, MD;  Location: [place];  Service: Orthopedics;  Laterality: Right;  PERCUTANEOUS CORONARY INTERVENTION-BALLOON ONLY  12/22/2011  Procedure: PERCUTANEOUS CORONARY INTERVENTION-BALLOON ONLY;  Surgeon: Troy Sine, MD;  Location: [facility] CATH LAB;  Service: Cardiovascular;;  PERCUTANEOUS CORONARY INTERVENTION-BALLOON ONLY  11/14/2012  Procedure: PERCUTANEOUS CORONARY INTERVENTION-BALLOON ONLY;  Surgeon: Troy Sine, MD;  Location: [facility] CATH LAB;  Service: Cardiovascular;;  PERCUTANEOUS CORONARY STENT INTERVENTION (PCI-S)  05/16/2012  Procedure: PERCUTANEOUS CORONARY STENT INTERVENTION (PCI-S);  Surgeon: Lorretta Harp, MD;  Location: [facility] CATH LAB;  Service: Cardiovascular;;  PERCUTANEOUS CORONARY STENT INTERVENTION (PCI-S)  09/01/2013  Procedure: PERCUTANEOUS CORONARY STENT INTERVENTION (PCI-S);  Surgeon: Leonie Man, MD;  Location: [facility] CATH LAB;  Service: Cardiovascular;; HPI: 68 year old male with history of COPD, CAD, diabetes mellitus, ischemic cardiomyopathy, RA, presented to the hospital and was admitted on 04/11/2019 with shortness of breath, he was found to be significantly hypoxic, tested positive for COVID-19, was intubated and transferred to [facility] for further management. Intubated 8/1-8/2, reintubated 8/3-8/22.   Had MBS after extubationk,s tarted on Dys 3, nect thick liquids. Had an aspiration event and cardiac event on 9/1 and went back to the ICU. Restarted on puree and honey thick liquids via teaspoon which  he toelrated for  another week. on 9/7 he was again found hypoxemic with elevated BP. Cardiac event again suspected, but CXR not convincing for new aspiration per MD and pt not eating at the time also per MD. pt was intubated for less than 24 hours. Additionally pt reports a history of an accident in 2017 in which he had a TBI/spinal fx (unclear report) with resulting dysphagia. He also reports baseline dysphonia.  Subjective: Pt was pleasant and cooperative.  He was agreeable to MBS. Assessment / Plan / Recommendation CHL IP CLINICAL IMPRESSIONS 05/22/2019 Clinical Impression Robert [name] is a 68 year old male who presents with moderate oropharyngeal dysphagia with resultant sensed aspiration of thin liquid and laryngeal penetration of nectar-thick liquid on today's examination.  Deficits are primarily associated with reduced laryngeal closure and reduced pharyngeal strength.  Pt completed chin tuck maneuver with thin liquid and it eliminated aspiration; however, deep laryngeal penetration was observed.  No laryngeal penetration or aspiration was observed with honey-thick liquid, puree, or regular solids.  Oral phase was remarkable for prolonged mastication and reduced lingual strength resulting in oral residue with all consistencies.  Pharyngeal phase was remarkable for reduced BOT retraction, reduced hyloaryngeal elevation/excursion, and reduced pharyngeal constriction resulting in trace-moderate pharyngeal residue.  Pharyngeal residue increased with increased viscosity of the trials.  Pharyngoesophageal phase was remarkable for suspected decreased length of UES opening resulting in pharyngeal residue at the top of the UES.  During esophageal sweep, barium was observed in the esophagus following completion of trials.   Recommend Dysphagia 2 (fine chopped) solids and nectar-thick liquid.  Pt may benefit from using chin tuck maneuver with thin liquid therapeutically with SLP present for supervision.  SLP Visit  Diagnosis Dysphagia, oropharyngeal phase (R13.12) Attention and concentration deficit following -- Frontal lobe and executive function deficit following -- Impact on safety and function Moderate aspiration risk   CHL IP TREATMENT RECOMMENDATION 05/22/2019 Treatment Recommendations Therapy as outlined in treatment plan below   Prognosis 05/22/2019 Prognosis for Safe Diet Advancement Good Barriers to Reach Goals -- Barriers/Prognosis Comment -- CHL IP DIET RECOMMENDATION 05/22/2019 SLP Diet Recommendations Nectar thick liquid;Dysphagia 2 (Fine chop) solids Liquid Administration via Cup;Straw Medication Administration Whole meds with puree Compensations Slow rate;Small sips/bites;Follow solids with liquid Postural Changes Remain semi-upright after after feeds/meals (Comment);Seated upright at 90 degrees   CHL IP OTHER RECOMMENDATIONS 05/22/2019 Recommended Consults Consider esophageal assessment Oral Care Recommendations Oral care BID Other Recommendations Order thickener from pharmacy   CHL IP FOLLOW UP RECOMMENDATIONS 05/22/2019 Follow up Recommendations Inpatient Rehab   CHL IP FREQUENCY AND DURATION 05/22/2019 Speech Therapy Frequency (ACUTE ONLY) min 2x/week Treatment Duration 2 weeks      CHL IP ORAL PHASE 05/22/2019 Oral Phase Impaired Oral - Pudding Teaspoon -- Oral - Pudding Cup -- Oral - Honey Teaspoon -- Oral - Honey Cup Delayed oral transit Oral - Nectar Teaspoon Delayed oral transit;Lingual/palatal residue Oral - Nectar Cup Delayed oral transit;Lingual/palatal residue Oral - Nectar Straw Delayed oral transit;Lingual/palatal residue Oral - Thin Teaspoon Lingual/palatal residue Oral - Thin Cup Lingual/palatal residue Oral - Thin Straw Lingual/palatal residue Oral - Puree Delayed oral transit;Lingual/palatal residue Oral - Mech Soft -- Oral - Regular Impaired mastication;Delayed oral transit;Lingual/palatal residue Oral - Multi-Consistency -- Oral - Pill -- Oral Phase - Comment --  CHL IP PHARYNGEAL PHASE 05/22/2019  Pharyngeal Phase Impaired Pharyngeal- Pudding Teaspoon -- Pharyngeal -- Pharyngeal- Pudding Cup -- Pharyngeal -- Pharyngeal- Honey Teaspoon -- Pharyngeal -- Pharyngeal- Honey Cup Reduced laryngeal elevation;Reduced tongue base retraction;Pharyngeal  residue - valleculae;Pharyngeal residue - pyriform;Pharyngeal residue - cp segment;Reduced pharyngeal peristalsis;Reduced anterior laryngeal mobility Pharyngeal Material does not enter airway Pharyngeal- Nectar Teaspoon Delayed swallow initiation-pyriform sinuses;Reduced laryngeal elevation;Reduced airway/laryngeal closure;Pharyngeal residue - valleculae;Pharyngeal residue - pyriform;Reduced tongue base retraction;Reduced pharyngeal peristalsis;Reduced anterior laryngeal mobility Pharyngeal -- Pharyngeal- Nectar Cup Reduced pharyngeal peristalsis;Reduced anterior laryngeal mobility;Reduced laryngeal elevation;Reduced airway/laryngeal closure;Reduced tongue base retraction;Penetration/Aspiration before swallow;Pharyngeal residue - valleculae;Pharyngeal residue - pyriform Pharyngeal Material enters airway, remains ABOVE vocal cords then ejected out Pharyngeal- Nectar Straw Reduced pharyngeal peristalsis;Delayed swallow initiation-pyriform sinuses;Reduced laryngeal elevation;Reduced anterior laryngeal mobility;Reduced airway/laryngeal closure;Reduced tongue base retraction;Penetration/Aspiration before swallow;Pharyngeal residue - valleculae;Pharyngeal residue - pyriform Pharyngeal Material enters airway, remains ABOVE vocal cords and not ejected out Pharyngeal- Thin Teaspoon Delayed swallow initiation-pyriform sinuses;Reduced anterior laryngeal mobility;Reduced laryngeal elevation;Reduced airway/laryngeal closure;Reduced tongue base retraction;Penetration/Aspiration before swallow;Moderate aspiration;Pharyngeal residue - valleculae Pharyngeal Material enters airway, passes BELOW cords then ejected out Pharyngeal- Thin Cup Delayed swallow initiation-pyriform sinuses;Reduced  airway/laryngeal closure;Reduced tongue base retraction;Reduced anterior laryngeal mobility;Reduced laryngeal elevation;Penetration/Aspiration before swallow;Pharyngeal residue - valleculae;Pharyngeal residue - pyriform Pharyngeal Material enters airway, remains ABOVE vocal cords then ejected out Pharyngeal- Thin Straw Moderate aspiration;Delayed swallow initiation-pyriform sinuses;Reduced anterior laryngeal mobility;Reduced laryngeal elevation;Reduced airway/laryngeal closure;Reduced tongue base retraction;Penetration/Aspiration before swallow;Pharyngeal residue - valleculae;Pharyngeal residue - pyriform Pharyngeal Material enters airway, passes BELOW cords then ejected out Pharyngeal- Puree Reduced pharyngeal peristalsis;Reduced anterior laryngeal mobility;Reduced laryngeal elevation;Reduced tongue base retraction;Pharyngeal residue - valleculae;Pharyngeal residue - cp segment Pharyngeal -- Pharyngeal- Mechanical Soft -- Pharyngeal -- Pharyngeal- Regular Delayed swallow initiation-vallecula;Reduced laryngeal elevation;Reduced anterior laryngeal mobility;Reduced tongue base retraction;Pharyngeal residue - valleculae;Pharyngeal residue - pyriform Pharyngeal -- Pharyngeal- Multi-consistency -- Pharyngeal -- Pharyngeal- Pill -- Pharyngeal -- Pharyngeal Comment --  CHL IP CERVICAL ESOPHAGEAL PHASE 05/22/2019 Cervical Esophageal Phase -- Pudding Teaspoon -- Pudding Cup -- Honey Teaspoon -- Honey Cup -- Nectar Teaspoon -- Nectar Cup -- Nectar Straw -- Thin Teaspoon -- Thin Cup -- Thin Straw -- Puree -- Mechanical Soft -- Regular -- Multi-consistency -- Pill -- Cervical Esophageal Comment Barium observed in esophagus following po trials.  Elvia Collum [place] 05/22/2019, 12:02 PM  Bretta Bang, M.S., [place] [facility] Office: [phone]                 Medical Problem List and Plan: 1.  Functional and mobility deficits secondary to debility after Covid respiratory failure  -admit to inpatient  rehab 2.  A fib/Antithrombotics: -DVT/anticoagulation:  Pharmaceutical: Other (comment)--Eliquis.   -antiplatelet therapy:  On Plavix.  3. Pain Management: oxycodone prn.  4. Mood: LCSW to follow for evaluation and support.   -antipsychotic agents: N/A 5. Neuropsych: This patient is capable of making decisions on his own behalf. 6. Sacral decub/Skin/Wound Care:   -Will order air mattress.   -Santyl with damp to dry dressings to slough on decubiti.   -consider referral for hydrotherapy 7. Fluids/Electrolytes/Nutrition: Monitor I/O. Check lytes in am. Add CM/Low salt restrictions to diet.  8. CAD/ICM: Not surgical candidate. Treated medically with metoprolol, Plavix and lipitor. 9. Acute on chronic systolic CHF: Monitor for signs of over load and check weights daily. Now on Lasix daily. 10 Hypokalemia: Likely due to diuresis. Will schedule K dur. 11. T2DM: Hgb A1c- 7.7. Was on Tujeo, metformin and acarbose PTA.  Acarbose added back on 9/08 with novolog for meal coverage. Will continue to monitor BS ac/hs and resume home meds as indicated.  12. Anxiety disorder: Provide ego support. Continue Xanax prn.   -will have neuropsych see 13. RA bilateral knees/hips:  Managed by Dr. Amil Amen. Stable off methotrexate/ Actemra at this time.   14. Urinary retention: Continue foley due to sacral decub and immobility. Continue flomax--d/c urecholine as has foley in.   -consider voiding trial next week? 13. COPD: Now on Dulera BID with nebs prn,. Will attempt to wean oxygen as able.  14. HTN: Has supine HTN? Orthostatic changes noted with therapy. Will continue to monitor for now.   15. PAF: In NSR on metoprolol and Eliquis.       Bary Leriche, PA-C 05/22/2019

## 2019-05-22 NOTE — Progress Notes (Signed)
Progress Note  Patient Name: Robert [name] Date of Encounter: 05/22/2019  Primary Cardiologist: Sanda Klein, MD   Subjective   Reports some difficulty sleeping last night, otherwise doing well.  Denies chest pain.  No shortness of breath at rest.  Inpatient Medications    Scheduled Meds: . acarbose  100 mg Oral TID WC  . apixaban  5 mg Oral BID  . atorvastatin  80 mg Oral q1800  . bethanechol  10 mg Oral BID  . clopidogrel  75 mg Oral Daily  . collagenase   Topical Daily  . feeding supplement (ENSURE ENLIVE)  237 mL Oral TID BM  . folic acid  1 mg Oral Daily  . furosemide  40 mg Oral Daily  . gabapentin  100 mg Oral Q8H  . insulin aspart  0-5 Units Subcutaneous QHS  . insulin aspart  0-9 Units Subcutaneous TID WC  . insulin aspart  3 Units Subcutaneous TID WC  . metoprolol tartrate  50 mg Oral BID  . mometasone-formoterol  2 puff Inhalation BID  . multivitamin with minerals  1 tablet Oral Daily  . mupirocin ointment   Nasal BID  . nystatin   Topical TID  . potassium chloride  40 mEq Oral Once  . tamsulosin  0.8 mg Oral QPC supper   Continuous Infusions: . sodium chloride Stopped (05/20/19 0603)   PRN Meds: sodium chloride, acetaminophen, albuterol, ALPRAZolam, diphenhydrAMINE, hydrALAZINE, Resource ThickenUp Clear   Vital Signs    Vitals:   05/21/19 1442 05/21/19 1638 05/21/19 2134 05/21/19 2313  BP: 138/74 140/63 140/70 131/71  Pulse: 88 92 87 76  Resp: 16 16    Temp: 98.9 F (37.2 C) 98.6 F (37 C) (!) 97.4 F (36.3 C) 98.3 F (36.8 C)  TempSrc: Oral Oral Oral Oral  SpO2: 96% 90% 93% 93%  Weight:      Height:        Intake/Output Summary (Last 24 hours) at 05/22/2019 [id number] Last data filed at 05/21/2019 2048 Gross per 24 hour  Intake 548.22 ml  Output 800 ml  Net -251.78 ml   Last 3 Weights 05/16/2019 05/15/2019 05/07/2019  Weight (lbs) (No Data) (No Data) 233 lb 14.5 oz  Weight (kg) (No Data) (No Data) 106.1 kg      Telemetry    Normal sinus  rhythm- Personally Reviewed   Physical Exam  Alert, oriented, chronically ill-appearing male on O2 per nasal cannula in no respiratory distress GEN: No acute distress.   Neck: No JVD Cardiac: RRR, 2/6 holosystolic murmur at the apex Respiratory:  Prolonged expiratory phase otherwise clear GI: Soft, nontender, non-distended  MS: No edema; No deformity. Neuro:  Nonfocal  Psych: Normal affect   Labs    High Sensitivity Troponin:   Recent Labs  Lab 05/14/19 1655 05/14/19 2020 05/15/19 0610 05/19/19 1915 05/19/19 2115  TROPONINIHS 675* 480* 338* 46* 49*      Chemistry Recent Labs  Lab 05/20/19 0530 05/21/19 0340 05/22/19 0407  NA 137 140 135  K 4.2 4.3 2.9*  CL 96* 100 99  CO2 28 29 25   GLUCOSE 87 85 229*  BUN 25* 26* 16  CREATININE 1.61* 1.59* 1.37*  CALCIUM 9.1 9.0 8.5*  PROT 7.0 6.7 6.6  ALBUMIN 2.3* 2.3* 2.0*  AST 27 36 27  ALT 68* 63* 55*  ALKPHOS 120 111 107  BILITOT 0.3 0.5 0.4  GFRNONAA 44* 45* 53*  GFRAA 51* 52* >60  ANIONGAP 13 11 11  Hematology Recent Labs  Lab 05/20/19 0530 05/21/19 0340 05/22/19 0407  WBC 7.4 7.2 5.9  RBC 2.79* 2.87* 2.61*  HGB 8.3* 8.5* 7.8*  HCT 26.2* 27.2* 24.3*  MCV 93.9 94.8 93.1  MCH 29.7 29.6 29.9  MCHC 31.7 31.3 32.1  RDW 15.9* 15.9* 15.4  PLT 245 210 218    BNPNo results for input(s): BNP, PROBNP in the last 168 hours.   DDimer No results for input(s): DDIMER in the last 168 hours.   Radiology    No results found.   Patient Profile     68 y.o. male with COPD and ischemic heart disease, with prolonged hospitalization for respiratory failure secondary to Covid-19 infection, now transferred to [facility] for medical stabilization prior to [place]    1.  Demand ischemia with elevated troponin: Patient with known CAD, elevated enzymes in the setting of severe medical illness.  Continue antiplatelet therapy with clopidogrel, high intensity statin drug, and a beta-blocker.   No plans for further inpatient evaluation considering his stability, lack of angina, and comorbid conditions. 2.  Acute on chronic combined systolic and diastolic heart failure: Remains euvolemic on exam.  Will start furosemide 40 mg daily.  Noted to be hypokalemic today.  Will make sure that potassium is repleted. 3.  Paroxysmal atrial fibrillation: Maintaining sinus rhythm.  Patient continues on metoprolol, anticoagulated with apixaban.  From a cardiovascular perspective, the patient appears stable.  His respiratory status seems to be improved.  Suspect he will be transitioning to [facility] inpatient rehab in the near future.  For questions or updates, please contact [place] Please consult [email or web address] for contact info under     Signed, Sherren Mocha, MD  05/22/2019, 9:24 AM

## 2019-05-22 NOTE — Significant Event (Addendum)
Rapid Response Event Note  Overview: Time Called: 2020 Arrival Time: 2024 Event Type: Respiratory(Desat 75% and acute distress)  Initial Focused Assessment: Robert [name] just arrived to CIR from 2W. Initially was on 2L [place]. He became acutely hypoxic and increased WOB with sats 74%. Upon my arrival, Robert [name] is in significant respiratory distress with RR 28-30 with abdominal accessory muscle use. Temp 97.8 F, HR 124 RRR, 174/101, RR 28-30 with sats 94% at 8L [place]. BBS rales in bilateral bases up to his nipple line. No JVD. Pt denies CP but says he has significant SOB. Skin is warm, pink and very diaphoretic. Pam Love PA notified of pts condition. Lasix ordered.  PCXR results: Continued diffuse interstitial and airspace opacities concerning for edema or infection. Small left effusion. Read by Dr. Ardeen Garland.   Interventions: -Stat PCXR -Lasix 40 mg IV now -pt placed on [place]  at [place] (if not transferred): -Reassess VS in one hour -Titrate O2 for sats > 92% -Keep HOB elevated >30% -Pt ok in current location if he responds to lasix.  If SOB doesn't improve, then consider transfer back to inpatient care. -Notify primary service and/or RRRN for further assistance  Event Summary: 2125- HR 109, 146/80 (90), RR 24 with sats 95% on 6L HFNC. Pt's WOB improved.  2215-Pt resting well. HR 86, sats 100%. No distress. HFNC decreased to 4LNC  Pt stayed in room Call ended 2135 Madelynn Done

## 2019-05-22 NOTE — TOC Transition Note (Signed)
Transition of Care [facility]) - CM/SW Discharge Note   Patient Details  Name: Robert [name] MRN: [id number] Date of Birth: 1951-09-07  [facility] CM/SW Contact:  Pollie Friar, RN Phone Number: 05/22/2019, 12:40 PM   Clinical Narrative:    Pt discharging to CIR today. CM signing off.   Final next level of care: IP Rehab Facility Barriers to Discharge: No Barriers Identified   Patient Goals and CMS Choice        Discharge Placement                       Discharge Plan and Services                                     Social Determinants of Health (SDOH) Interventions     Readmission Risk Interventions No flowsheet data found.

## 2019-05-22 NOTE — Progress Notes (Signed)
Meredith Staggers, MD  Physician  Physical Medicine and Rehabilitation  PMR Pre-admission  Signed  Date of Service:  05/16/2019 3:09 PM      Related encounter: ED to Hosp-Admission (Current) from 04/11/2019 in [place] [facility]      Signed         Show:Clear all [x] Manual[x] Template[x] Copied  Added by: [x] Meredith Staggers, MD[x] Robert [name], Robert [name]  [] Hover for details PMR Admission Coordinator Pre-Admission Assessment  Patient: Robert [name] is an 68 y.o., male MRN: [id number] DOB: 1951/03/03 Height: 6' (182.9 cm) Weight: (No scale on the bed)  Insurance Information HMO: yes    PPO:      PCP:      IPA:      80/20:      OTHER:  PRIMARY: [employer]      Policy#: [id number]      Subscriber: patient CM Name: case auto approved, ref #[id number] with updates due to clinical intake fax 7 days from admission (9/16) at [phone]    Phone#:      Fax#: [phone] Pre-Cert#: [id number] case auto approved     Employer:  Benefits:  Phone #: [phone]     Name:  Eff. Date: 09/11/2017     Deduct: $0 for in network providers      Out of Pocket Max: $3,400 (met $450)      Life Max: n/a CIR: $295/day for days 1-6      SNF: 20 full days Outpatient:      Co-Pay: $10-$40/visit Home Health: 100%      Co-Pay:  DME: 80%     Co-Pay: 20% Providers:  SECONDARY:       Policy#:       Subscriber:  CM Name:       Phone#:      Fax#:  Pre-Cert#:       Employer:  Benefits:  Phone #:      Name:  Eff. Date:      Deduct:       Out of Pocket Max:       Life Max:  CIR:       SNF:  Outpatient:      Co-Pay:  Home Health:       Co-Pay:  DME:      Co-Pay:   Medicaid Application Date:       Case Manager:  Disability Application Date:       Case Worker:   The "Data Collection Information Summary" for patients in Inpatient Rehabilitation Facilities with attached "Privacy Act [place] Records" was provided and verbally reviewed with: Patient  Emergency Contact  Information         Contact Information    Name Relation Home Work Mobile   Butters Daughter   [phone]      Current Medical History  Patient Admitting Diagnosis: Post-COVID-19 debility  History of Present Illness: Robert [name] is a 67 y/o male with PMH of COPD, CAD, DM, RA, CHF, and cardiomyopathy admitted to [facility] on 04/11/2019 for shortness of breath.  He was found to be significantly hypoxic, tested positive for COVID-19 and was intubated and transferred to [facility]. His hospital course was complicated by failed attempts at extubation, MRSA pneumonia, septic shock, and agitation.  Robert [name] was successfully extubated on 8/22, but was unfortunately transferred back to ICU on same day due to acute onset of diaphoresis, tachycardia, tachypnea, and confusion.  CXR consistent with aspiration pneumonia.  Robert [name] with elevated troponins and cardiology  was consulted.  EKG unchanged from the one on 8/19, ECHO unchanged from prior.  He was started on lasix, cardiology signed off on 9/3.  On 9/7 Robert [name] with sudden onset of respiratory failure in the setting of flash pulmonary edema, requiring reintubation and transfer back to ICU.  He was successfully extubated on 9/8 and weaned down to 1L o2 via nasal cannula.  Robert [name] transferred to [facility] on 9/9 for Cardiology consult and trial with CPAP.  Cardiology felt elevated troponins due to demand ischemia and did not recommend further inpatient treatment.  Therapy evaluations have been completed and Robert [name] was recommended for CIR.     Patient's medical records from [facility] and [facility] have been reviewed by the rehabilitation admission coordinator and physician.  Past Medical History      Past Medical History:  Diagnosis Date  . Anxiety   . COPD (chronic obstructive pulmonary disease) ([place])   . Coronary artery disease   . Full dentures   . GERD (gastroesophageal reflux disease)    Rolaids as needed  . High cholesterol   .  History of MI (myocardial infarction)   . Hypertension    med. dosage increased 04/2016; has been on BP med. x 10 yrs.  . Incarcerated umbilical hernia 19/7588  . Inguinal hernia 05/2016   bilateral   . Insulin dependent diabetes mellitus ([place])   . Ischemic cardiomyopathy    a. 03/2015 EF 40-45% by LV gram.  . Left leg cellulitis 10/08/2016  . Rheumatoid arthritis(714.0)     Family History   family history includes Colon cancer in his brother and mother; Heart disease in his father and mother; Hypertension in his mother; Lung cancer in his mother.  Prior Rehab/Hospitalizations Has the patient had prior rehab or hospitalizations prior to admission? Yes  Has the patient had major surgery during 100 days prior to admission? Yes             Current Medications  Current Facility-Administered Medications:  .  0.9 %  sodium chloride infusion, , Intravenous, PRN, Robert Cousins, MD, Stopped at 05/20/19 3254 .  acarbose (PRECOSE) tablet 100 mg, 100 mg, Oral, TID WC, Robert Cousins, MD, 100 mg at 05/21/19 1826 .  acetaminophen (TYLENOL) tablet 650 mg, 650 mg, Oral, Q6H PRN, Robert Cousins, MD, 650 mg at 05/20/19 2137 .  albuterol (VENTOLIN HFA) 108 (90 Base) MCG/ACT inhaler 2 puff, 2 puff, Inhalation, Q6H PRN, Robert Cousins, MD .  ALPRAZolam Duanne Moron) tablet 0.5 mg, 0.5 mg, Oral, TID PRN, Robert Cousins, MD .  apixaban Arne Cleveland) tablet 5 mg, 5 mg, Oral, BID, Robert [name], Robert [name], RPH, 5 mg at 05/22/19 0935 .  atorvastatin (LIPITOR) tablet 80 mg, 80 mg, Oral, q1800, Robert Cousins, MD, 80 mg at 05/21/19 1648 .  bethanechol (URECHOLINE) tablet 10 mg, 10 mg, Oral, BID, Robert Cousins, MD, 10 mg at 05/21/19 2300 .  clopidogrel (PLAVIX) tablet 75 mg, 75 mg, Oral, Daily, Robert Cousins, MD, 75 mg at 05/22/19 0935 .  collagenase (SANTYL) ointment, , Topical, Daily, Robert Cousins, MD .  diphenhydrAMINE (BENADRYL) injection 12.5 mg, 12.5 mg,  Intravenous, Q6H PRN, Robert Cousins, MD, 12.5 mg at 05/20/19 0028 .  feeding supplement (ENSURE ENLIVE) (ENSURE ENLIVE) liquid 237 mL, 237 mL, Oral, TID BM, Robert Cousins, MD, 237 mL at 05/22/19 9826 .  folic acid (FOLVITE) tablet 1 mg, 1 mg, Oral, Daily, Robert Cousins, MD, 1 mg  at 05/22/19 0938 .  furosemide (LASIX) tablet 40 mg, 40 mg, Oral, Daily, Robert Mocha, MD, 40 mg at 05/22/19 0936 .  gabapentin (NEURONTIN) capsule 100 mg, 100 mg, Oral, Q8H, Robert Cousins, MD, 100 mg at 05/22/19 0545 .  hydrALAZINE (APRESOLINE) injection 10-40 mg, 10-40 mg, Intravenous, Q4H PRN, Robert Cousins, MD, 20 mg at 05/19/19 1708 .  insulin aspart (novoLOG) injection 0-5 Units, 0-5 Units, Subcutaneous, QHS, Robert Cousins, MD, 4 Units at 05/21/19 2141 .  insulin aspart (novoLOG) injection 0-9 Units, 0-9 Units, Subcutaneous, TID WC, Robert Cousins, MD, 3 Units at 05/22/19 [phone] .  insulin aspart (novoLOG) injection 3 Units, 3 Units, Subcutaneous, TID WC, Robert Cousins, MD, 3 Units at 05/22/19 [phone] .  metoprolol tartrate (LOPRESSOR) tablet 50 mg, 50 mg, Oral, BID, Robert Cousins, MD, 50 mg at 05/22/19 0935 .  mometasone-formoterol (DULERA) 200-5 MCG/ACT inhaler 2 puff, 2 puff, Inhalation, BID, Robert Cousins, MD, 2 puff at 05/21/19 0747 .  multivitamin with minerals tablet 1 tablet, 1 tablet, Oral, Daily, Robert Cousins, MD, 1 tablet at 05/22/19 0935 .  mupirocin ointment (BACTROBAN) 2 %, , Nasal, BID, Robert Cousins, MD .  nystatin (MYCOSTATIN/NYSTOP) topical powder, , Topical, TID, Robert Cousins, MD .  potassium chloride SA (K-DUR) CR tablet 40 mEq, 40 mEq, Oral, Once, Robert Mocha, MD .  Resource ThickenUp Clear, , Oral, PRN, Robert Cousins, MD .  tamsulosin [facility]) capsule 0.8 mg, 0.8 mg, Oral, QPC supper, Robert Cousins, MD, 0.8 mg at 05/21/19 1647  Patients Current Diet:     Diet Order                   DIET DYS 2 Room service appropriate? Yes; Fluid consistency: Nectar Thick  Diet effective now               Precautions / Restrictions Precautions Precautions: Fall Precaution Comments: sacral wound Other Brace: B Prevalon boots Restrictions Weight Bearing Restrictions: No   Has the patient had 2 or more falls or a fall with injury in the past year? No  Prior Activity Level Community (5-7x/wk): went out of the house daily, occasional use of cane, driving  Prior Functional Level Self Care: Did the patient need help bathing, dressing, using the toilet or eating? Independent  Indoor Mobility: Did the patient need assistance with walking from room to room (with or without device)? Independent  Stairs: Did the patient need assistance with internal or external stairs (with or without device)? Independent  Functional Cognition: Did the patient need help planning regular tasks such as shopping or remembering to take medications? Independent  Home Assistive Devices / Equipment Home Assistive Devices/Equipment: Dentures (specify type), Cane (specify quad or straight)(upper and lower) Home Equipment: Cane - single point  Prior Device Use: Indicate devices/aids used by the patient prior to current illness, exacerbation or injury? occasional cane  Current Functional Level Cognition  Overall Cognitive Status: Impaired/Different from baseline Difficult to assess due to: Intubated Current Attention Level: Selective Orientation Level: Oriented X4 Safety/Judgement: Decreased awareness of safety, Decreased awareness of deficits General Comments: Robert [name] able to recall he is in the hospital, educated of name of hospital and Robert [name] able to recall end of session. Robert [name] does require education/cues for safety and of his current deficits    Extremity Assessment (includes Sensation/Coordination)  Upper Extremity Assessment: Generalized weakness RUE Deficits / Details: overall 3+/5 RUE  Coordination: decreased fine motor, decreased gross motor  LUE Deficits / Details: L RTC insufficiency at baleine  Lower Extremity Assessment: Defer to Robert [name] evaluation RLE Deficits / Details: active movements, very weak , decreasd hip internal rotation. LLE Deficits / Details: similar to right in active movement, no hip tightness    ADLs  Overall ADL's : Needs assistance/impaired Eating/Feeding: Supervision/ safety, Set up Eating/Feeding Details (indicate cue type and reason): honey thick liquids Grooming: Minimal assistance, Sitting Grooming Details (indicate cue type and reason): difficulty reaching hair Upper Body Bathing: Minimal assistance, Sitting Lower Body Bathing: Maximal assistance, Sit to/from stand Lower Body Bathing Details (indicate cue type and reason): Robert [name] ablet o reach and clean groin area; would most likely benefit form long handled sponge Upper Body Dressing : Moderate assistance, Sitting Lower Body Dressing: Maximal assistance, Sit to/from stand Lower Body Dressing Details (indicate cue type and reason): assist to adjust socks Toileting- Clothing Manipulation and Hygiene: Total assistance Toileting - Clothing Manipulation Details (indicate cue type and reason): condom cath placed. Robert [name] excited about having foley removed Functional mobility during ADLs: +2 for physical assistance, Moderate assistance, Maximal assistance(using Stedy) General ADL Comments: generalized weakness affecting ADL    Mobility  Overal bed mobility: Needs Assistance Bed Mobility: Supine to Sit, Sit to Supine Rolling: Supervision Sidelying to sit: Min assist Supine to sit: Mod assist, +2 for physical assistance, +2 for safety/equipment Sit to supine: Mod assist, +2 for physical assistance, +2 for safety/equipment Sit to sidelying: Min guard, Min assist General bed mobility comments: HOB elevated and use of rail; incr time and cues for technique; assist for LEs and trunk elevation, assist to scoot  hips towards EOB    Transfers  Overall transfer level: Needs assistance Transfer via Lift Equipment: Stedy Transfers: Sit to/from Stand Sit to Stand: +2 physical assistance, +2 safety/equipment, Mod assist, From elevated surface General transfer comment: sit<>stand from EOB and from seat of Stedy    Ambulation / Gait / Stairs / Office manager / Balance Dynamic Sitting Balance Sitting balance - Comments: able to reposition self in recliner; S with anterior/posteiror weight shifts and lateral weight shifts Balance Overall balance assessment: Needs assistance Sitting-balance support: No upper extremity supported, Feet supported Sitting balance-Leahy Scale: Fair Sitting balance - Comments: able to reposition self in recliner; S with anterior/posteiror weight shifts and lateral weight shifts Standing balance support: Bilateral upper extremity supported Standing balance-Leahy Scale: Poor Standing balance comment: while in stedy, able to keep trunk upright with cues to squeeze buttocks    Special needs/care consideration BiPAP/CPAP no CPM no Continuous Drip IV Ampicillin Dialysis no        Days n/a Life Vest no Oxygen on 1L via nasal cannula Special Bed no Trach Size no Wound Vac (area) no      Location n/a Skin  WOC Nurse wound follow up Patient receiving care in [place]. F/U on bilateral buttock and coccyx wounds performed via Telehealth and assistance of primary RN, Robert [name]. Wound type:all three are unstageable Measurement:To be provided by the bedside RN in the flowsheet section Wound ZOX:WRUEA buttock wound bed is 100% dark brown and yellow. Coccyx and left buttock wounds are 100% yellow. Drainage (amount, consistency, odor)none Periwound:intact Dressing procedure/placement/frequency:continue with Santyl and saline moistened gauze. If there is not significant improvement next week, it might be a good idea to try Dakin's solution for a short while.  Monitor the wound area(s) for worsening of condition such as: Signs/symptoms of infection,  Increase in size,  Development of  or worsening of odor, Development of pain, or increased pain at the affected locations. Notify the medical team if any of these develop.  Bowel mgmt: incontinent, last BM 9/3 Bladder mgmt: continent Diabetic mgmt: yes Behavioral consideration no Chemo/radiation no   Previous Home Environment (from acute therapy documentation) Living Arrangements: Spouse/significant other, Children Available Help at Discharge: Family Type of Home: House Home Layout: Multi-level, Able to live on main level with bedroom/bathroom Home Access: Stairs to enter Entrance Stairs-Rails: Left Entrance Stairs-Number of Steps: 3 Bathroom Shower/Tub: Chiropodist: Handicapped [place]: No Additional Comments: info  from previous encounter  Discharge Living Setting Plans for Discharge Living Setting: Lives with (comment)(step-son) Type of Home at Discharge: House Discharge Home Layout: Two level, Able to live on main level with bedroom/bathroom Discharge Home Access: Stairs to enter Entrance Stairs-Rails: Can reach both Entrance Stairs-Number of Steps: 3 Discharge Bathroom Shower/Tub: Tub/shower unit Discharge Bathroom Toilet: Handicapped height Discharge Bathroom Accessibility: Yes How Accessible: Accessible via walker Does the patient have any problems obtaining your medications?: No  Social/Family/Support Systems Anticipated Caregiver: daughter Robert [name]), son Robert [name]), and step son Robert [name]) Anticipated Caregiver's Contact Information: Robert [name] [phone] Ability/Limitations of Caregiver: Robert [name] states Robert [name] is mentally handicapped but able to take care of himself and that Robert [name] and Robert [name] can help if needed.  They both confirm this.  Caregiver Availability: 24/7 Discharge Plan Discussed with Primary Caregiver: Yes Is  Caregiver In Agreement with Plan?: Yes Does Caregiver/Family have Issues with Lodging/Transportation while Robert [name] is in Rehab?: No  Goals/Additional Needs Patient/Family Goal for Rehab: Robert [name]/OT/SLP supervision to mod I Expected length of stay: 14-18 days Dietary Needs: dys 1/honey Robert [name]/Family Agrees to Admission and willing to participate: Yes Program Orientation Provided & Reviewed with Robert [name]/Caregiver Including Roles  & Responsibilities: Yes  Decrease burden of Care through IP rehab admission: n/a  Possible need for SNF placement upon discharge: No. Family prepared to take Robert [name] home with 24/7 supervision/min assist if needed.    Patient Condition: I have reviewed medical records from [facility], spoken with CM, and patient, son and daughter. I discussed via phone for inpatient rehabilitation assessment.  Patient will benefit from ongoing Robert [name], OT and SLP, can actively participate in 3 hours of therapy a day 5 days of the week, and can make measurable gains during the admission.  Patient will also benefit from the coordinated team approach during an Inpatient Acute Rehabilitation admission.  The patient will receive intensive therapy as well as Rehabilitation physician, nursing, social worker, and care management interventions.  Due to bladder management, bowel management, safety, skin/wound care, disease management, medication administration, pain management and patient education the patient requires 24 hour a day rehabilitation nursing.  The patient is currently mod +2 with mobility and basic ADLs.  Discharge setting and therapy post discharge at home with home health is anticipated.  Patient has agreed to participate in the Acute Inpatient Rehabilitation Program and will admit today.  Preadmission Screen Completed By:  Robert [name], Robert [name], Robert [name] 05/22/2019 11:31 AM ______________________________________________________________________   Discussed status with Dr. Naaman Plummer on 05/22/19  at 11:31 AM   and received approval for admission today.  Admission Coordinator:  Robert [name], Robert [name], Robert [name] time 11:31 AM Robert [name] 05/22/19    Assessment/Plan: Diagnosis: debility after Covid respiratory failure 1. Does the need for close, 24 hr/day Medical supervision in concert with the patient's rehab needs make it unreasonable for this patient to be served in a less intensive setting? Yes  2. Co-Morbidities requiring supervision/potential complications: COPD, CAD, DM, RA CHF 3. Due to bladder management, bowel management, safety, skin/wound care, disease management, medication administration, pain management and patient education, does the patient require 24 hr/day rehab nursing? Yes 4. Does the patient require coordinated care of a physician, rehab nurse, Robert [name] (1-2 hrs/day, 5 days/week), OT (1-2 hrs/day, 5 days/week) and SLP (1-2 hrs/day, 5 days/week) to address physical and functional deficits in the context of the above medical diagnosis(es)? Yes Addressing deficits in the following areas: balance, endurance, locomotion, strength, transferring, bowel/bladder control, bathing, dressing, feeding, grooming, toileting, cognition, swallowing and psychosocial support 5. Can the patient actively participate in an intensive therapy program of at least 3 hrs of therapy 5 days a week? Yes 6. The potential for patient to make measurable gains while on inpatient rehab is excellent 7. Anticipated functional outcomes upon discharge from inpatients are: modified independent and supervision Robert [name], modified independent and supervision OT, modified independent and supervision SLP 8. Estimated rehab length of stay to reach the above functional goals is: 14-18 days 9. Anticipated D/C setting: Home 10. Anticipated post D/C treatments: [place] therapy 11. Overall Rehab/Functional Prognosis: excellent  MD Signature: Meredith Staggers, MD, [place] [facility] 05/22/2019         Revision History

## 2019-05-22 NOTE — Progress Notes (Addendum)
Modified Barium Swallow Progress Note  Patient Details  Name: Robert [name] MRN: [id number] Date of Birth: 12-15-1950  Today's Date: 05/22/2019  Modified Barium Swallow completed.  Full report located under Chart Review in the Imaging Section.  Brief recommendations include the following:  Clinical Impression  Robert [name] is a 68 year old male who presents with moderate oropharyngeal dysphagia with resultant sensed aspiration of thin liquid and laryngeal penetration of nectar-thick liquid on today's examination.  Deficits are primarily associated with reduced laryngeal closure and reduced pharyngeal strength.  Pt completed chin tuck maneuver with thin liquid and it eliminated aspiration; however, deep laryngeal penetration was observed.  No laryngeal penetration or aspiration was observed with honey-thick liquid, puree, or regular solids.  Oral phase was remarkable for prolonged mastication and reduced lingual strength resulting in oral residue with all consistencies.  Pharyngeal phase was remarkable for reduced BOT retraction, reduced hyloaryngeal elevation/excursion, and reduced pharyngeal constriction resulting in trace-moderate pharyngeal residue.  Pharyngeal residue increased with increased viscosity of the trials.  Pharyngoesophageal phase was remarkable for suspected decreased length of UES opening resulting in pharyngeal residue at the top of the UES.  During esophageal sweep, barium was observed in the esophagus following completion of trials.   Recommend Dysphagia 2 (fine chopped) solids and nectar-thick liquid.  Pt may benefit from using chin tuck maneuver with thin liquid therapeutically with SLP present for supervision.    Swallow Evaluation Recommendations   Recommended Consults: Consider esophageal assessment   SLP Diet Recommendations: Nectar thick liquid;Dysphagia 2 (Fine chop) solids   Liquid Administration via: Cup;Straw   Medication Administration: Whole meds with  puree   Supervision: Patient able to self feed   Compensations: Slow rate;Small sips/bites;Follow solids with liquid   Postural Changes: Remain semi-upright after after feeds/meals (Comment);Seated upright at 90 degrees   Oral Care Recommendations: Oral care BID   Other Recommendations: Order thickener from Robert [name], M.S., Robert [name] Office: [phone]    Robert [name] 05/22/2019,11:48 AM

## 2019-05-22 NOTE — Progress Notes (Signed)
Inpatient Diabetes Program Recommendations  AACE/ADA: New Consensus Statement on Inpatient Glycemic Control   Target Ranges:  Prepandial:   less than 140 mg/dL      Peak postprandial:   less than 180 mg/dL (1-2 hours)      Critically ill patients:  140 - 180 mg/dL   Results for Robert [name], Robert [name] (MRN [id number]) as of 05/22/2019 11:12  Ref. Range 05/21/2019 07:39 05/21/2019 11:40 05/21/2019 16:30 05/21/2019 21:17 05/22/2019 07:33  Glucose-Capillary Latest Ref Range: 70 - 99 mg/dL 126 (H) 180 (H) 239 (H) 302 (H) 222 (H)   Review of Glycemic Control  Diabetes history: DM2 Outpatient Diabetes medications: Toujeo 80 units daily, Metformin 1500 mg QAM, Metformin 1000 mg QPM, Glipizide 10 mg BID Current orders for Inpatient glycemic control: Novolog 0-9 units TID with meals, Novolog 0-5 units QHS, Novolog 3 units TID with meals for meal coverage; Precose 100 mg TID with meals  Inpatient Diabetes Program Recommendations:   Insulin-Basal: If glucose continues to be consistently greater than 180 mg/dl, please consider ordering Lantus 10 units Q24H (based on 106 kg x 0.1 units).  Diet: If appropriate, please consider adding Carb Modified to Dys 2 diet.  Thanks, Barnie Alderman, RN, MSN, CDE Diabetes Coordinator Inpatient Diabetes Program [phone] (Team Pager from 8am to 5pm)

## 2019-05-22 NOTE — Discharge Summary (Signed)
Physician Discharge Summary  Robert [name] [id number] DOB: 22-Aug-1951 DOA: 04/11/2019  PCP: Redmond School, MD  Admit date: 04/11/2019 Discharge date: 05/22/2019  Admitted From: Home  Disposition:  CIR   Recommendations for Outpatient Follow-up and new medication changes:  1. Follow up with Dr. Gerarda Fraction 7 days post discharge.  2. Follow-up basic metabolic panel in 24 hours. 3. Resume furosemide 40 mg daily for diuresis.  4. Holding glipizide and long acting insulin for now. Resume metformin.  5. Metoprolol increased to 50 mg bid, holding on isosorbide and losartan. 6. Holding on methotrexate.   Home Health: na   Equipment/Devices: na    Discharge Condition: stable  CODE STATUS: full  Diet recommendation: no family at the bedside   Brief/Interim Summary: 68 year old male who presented with dyspnea.  He does have significant past history of anxiety, COPD, coronary artery disease, GERD, dyslipidemia, hypertension, type 2 diabetes mellitus,  ischemic cardiomyopathy, and rheumatoid arthritis.  He reported 2 days of progressive dyspnea, initially on exertion and then at rest.  Positive fever and cough.  He called EMS due to severe respiratory distress.  While in the emergency department he had increased work of breathing, respiratory distress with a rapid oxygen saturation requiring intubation and mechanical ventilation.  His lungs had diffuse wheezing bilaterally, heart S1-S2 present, tachycardic, abdomen was soft and nontender, no lower extremity edema.  His chest radiograph had a dense infiltrate on the left upper lobe, mid lung zone, and a second infiltrate on the right lower lobe.  SARS COVID-19 was positive.  Patient was admitted to the intensive care unit working diagnosis of severe hypoxic respiratory failure due to SARS [id number] pneumonia, complicated by sepsis.   Patient had a very prolonged hospital stay.  Developed MRSA pneumonia and further septic shock.  He had significant  difficulty with liberation of the mechanical ventilator, requiring 1 reintubation.   Finally liberated from mechanical ventilation September 9.  His COVID test has turned out negative.  Patient was deemed stable for further therapy at inpatient rehab.  1.  Acute hypoxic respiratory failure due to multilobar viral pneumonia, [id number], complicated by MRSA pneumonia and COPD exacerbation.   Patient had a prolonged hospital stay, he required invasive mechanical ventilation.  He received treatment with Remdesivir and Actemra for COVID-19 pneumonia.  He also received systemic corticosteroids and broad-spectrum antibiotic therapy including Zosyn, azithromycin, cefepime, vancomycin and Unasyn.  His respiratory cultures grew MRSA.  He was also unable to have an aspiration pneumonia.  Speech therapy recommended dysphagia 1 diet.  After aggressive pulmonary management he was extubated August 22.  September 7 he developed acute pulmonary edema that required reintubation.  After aggressive diuresis he was again liberated from mechanical ventilation September 8.  His oxygen requirements have been improving, today he is down to 1 L/min per nasal cannula with oxygen saturation at 93%.  His dyspnea also has improved, he is very weak and deconditioned, will continue therapy at inpatient rehab.  He has been retested for SARS COVID-19 on August 30 and September 2, both turn negative, meaning successful virus clearance.  2.  Septic shock due to MRSA pneumonia.  Patient was aggressively treated with vasopressors, broad-spectrum IV antibiotic therapy and mechanical ventilation.  He regained hemodynamic stability and he was liberated from critical care support.  3.  Acute on chronic systolic/diastolic heart failure.  Patient developed acute pulmonary edema, requiring diuresis with loop diuretics.  His biomarkers were elevated, likely related to demand ischemia.  By the time of his  discharge he seems to be euvolemic.  Continue 40 mg furosemide daily. Antiplatelet therapy with clopidogrel.   4.  Paroxysmal atrial fibrillation with rapid ventricular response.  Patient received rate control with av blockade, initially anticoagulated with IV heparin now transitioned to apixaban.  5.  Type 2 diabetes mellitus/dyslipidemia..  Patient was placed on insulin sliding scale for glucose coverage monitoring.  Continue statin therapy. At discharge will resume metformin. Hold on glipizide for now.   6.  Acute kidney injury with hypokalemia, hyponatremia and hypomagnesemia.  Patient received supportive medical therapy for his acute kidney injury, his electrolytes were corrected, by the time of discharge his creatinine is down to 1.37.  Recommend check renal panel in 24 hours.  7.  Acute urinary retention with hematuria./Not present on admission.  Patient required indwelling Foley catheter, recommendations to check catheter every 4 weeks, placed on Flomax.  Urology was consulted.  8.  Rheumatoid arthritis.  Methotrexate was discontinued, patient received Actemra while hospitalized.  9.  Stage II sacral pressure ulcer.  Unable to stage right and left buttock pressure ulcers.  Not present on admission.  Continue local wound care.  10. HTN. Continue metoprolol, hold on isosorbide and losartan.   Discharge Diagnoses:  Principal Problem:   Pneumonia due to COVID-19 virus Active Problems:   Rheumatoid arthritis ([place])   Hyperlipidemia   COPD (chronic obstructive pulmonary disease) (HCC)   Controlled type 2 diabetes mellitus with diabetic peripheral angiopathy without gangrene, with long-term current use of insulin (HCC)   Essential hypertension   Chronic combined systolic and diastolic CHF (congestive heart failure) (HCC)   Ischemic cardiomyopathy   Coronary artery disease   Dyslipidemia (high LDL; low HDL)   Acute respiratory failure with hypoxemia (HCC)   COVID-19 virus detected   Acute respiratory distress syndrome  (ARDS) due to COVID-19 virus   Atrial fibrillation with RVR (HCC)   COPD (chronic obstructive pulmonary disease) with emphysema (HCC)   AKI (acute kidney injury) ([place])   Diabetes mellitus type 2, uncontrolled, with complications (HCC)   Pressure injury of skin   Staphylococcal pneumonia ([place])   HCAP (healthcare-associated pneumonia)   Aspiration pneumonia of both lower lobes due to gastric secretions (HCC)   MRSA pneumonia (HCC)   Severe sepsis with septic shock ([place])   NSTEMI (non-ST elevated myocardial infarction) ([place])   Stage II decubitus ulcer ([place])   Demand ischemia ([place])   Flash pulmonary edema ([place])    Discharge Instructions   Allergies as of 05/22/2019      Reactions   Ivp Dye [iodinated Diagnostic Agents] Itching, Rash      Medication List    STOP taking these medications   ALPRAZolam 0.5 MG tablet Commonly known as: XANAX   AMBULATORY NON FORMULARY MEDICATION   aspirin 81 MG EC tablet   glipiZIDE 10 MG tablet Commonly known as: GLUCOTROL   isosorbide mononitrate 60 MG 24 hr tablet Commonly known as: IMDUR   losartan 25 MG tablet Commonly known as: COZAAR   methotrexate 50 MG/2ML injection   predniSONE 5 MG tablet Commonly known as: DELTASONE   Tocilizumab 162 MG/0.9ML [place]   TOUJEO SOLOSTAR [place]     TAKE these medications   acarbose 100 MG tablet Commonly known as: PRECOSE Take 100 mg by mouth 3 (three) times daily with meals.   acetaminophen 650 MG CR tablet Commonly known as: TYLENOL Take 650 mg by mouth every 8 (eight) hours as needed for pain.   apixaban 5 MG Tabs tablet Commonly  known as: ELIQUIS Take 1 tablet (5 mg total) by mouth 2 (two) times daily.   atorvastatin 80 MG tablet Commonly known as: LIPITOR TAKE 1 TABLET DAILY AT 6 PM. What changed: See the new instructions.   clopidogrel 75 MG tablet Commonly known as: PLAVIX TAKE (1) TABLET BY MOUTH ONCE DAILY.   feeding supplement (ENSURE ENLIVE) Liqd Take 237 mLs by mouth 3  (three) times daily between meals.   fish oil-omega-3 fatty acids 1000 MG capsule Take 1 g by mouth daily.   folic acid 1 MG tablet Commonly known as: FOLVITE Take 1 mg by mouth daily.   furosemide 40 MG tablet Commonly known as: LASIX Take 1 tablet (40 mg total) by mouth daily. Start taking on: May 23, 2019 What changed:   how much to take  how to take this  when to take this  additional instructions   gabapentin 300 MG capsule Commonly known as: NEURONTIN Take 300 mg by mouth 3 (three) times daily as needed (for pain).   metFORMIN 1000 MG tablet Commonly known as: GLUCOPHAGE Take 1 tablet (1,000 mg total) by mouth 2 (two) times daily with a meal. What changed:   how much to take  when to take this  additional instructions   metoprolol tartrate 50 MG tablet Commonly known as: LOPRESSOR Take 1 tablet (50 mg total) by mouth 2 (two) times daily. What changed:   medication strength  See the new instructions.   nitroGLYCERIN 0.4 MG SL tablet Commonly known as: NITROSTAT Place 1 tablet (0.4 mg total) under the tongue every 5 (five) minutes x 3 doses as needed for chest pain.   potassium chloride SA 20 MEQ tablet Commonly known as: K-DUR 1 TABLET BY MOUTH DAILY.   primidone 50 MG tablet Commonly known as: MYSOLINE Take 100 mg by mouth daily.   tamsulosin 0.4 MG Caps capsule Commonly known as: FLOMAX Take 2 capsules (0.8 mg total) by mouth daily after supper.       Allergies  Allergen Reactions  . Ivp Dye [Iodinated Diagnostic Agents] Itching and Rash    Consultations:  Cardiology   Urology    Procedures/Studies: Dg Abd 1 View  Result Date: 05/13/2019 CLINICAL DATA:  Vomiting EXAM: ABDOMEN - 1 VIEW COMPARISON:  04/30/2019 FINDINGS: Nonobstructive pattern of bowel gas with scattered gas present to the rectum. Scattered stool in the colon and high density ingested material in the cecal base. No free air in the abdomen on supine radiographs.  A previously seen enteric feeding tube and esophagogastric tube have been removed. IMPRESSION: Nonobstructive pattern of bowel gas with scattered gas present to the rectum. Scattered stool in the colon and high density ingested material in the cecal base. Electronically Signed   By: Eddie Candle M.D.   On: 05/13/2019 16:15   Dg Abd 1 View  Result Date: 04/30/2019 CLINICAL DATA:  NG tube placement EXAM: ABDOMEN - 1 VIEW COMPARISON:  None. FINDINGS: NG tube and feeding tube are in place, with the tip are both in the distal stomach. IMPRESSION: NG tube and feeding tube tip in the distal stomach. Electronically Signed   By: Rolm Baptise M.D.   On: 04/30/2019 08:22   Dg Chest Port 1 View  Result Date: 05/19/2019 CLINICAL DATA:  Intubation and OG placement EXAM: PORTABLE CHEST 1 VIEW COMPARISON:  Chest radiograph 05/19/2019 FINDINGS: Interval placement of an endotracheal tube with tip terminating between the thoracic inlet and carina. Placement of a nasogastric tube with tip extending below  the diaphragm beyond the field of view. Stable cardiomediastinal contours. Bilateral pulmonary infiltrates involving the left mid to lower lung and right lower lung suspicious for multifocal infection and/or edema. No pneumothorax. Possible small left pleural effusion. IMPRESSION: 1. Interval placement of endotracheal and nasogastric tubes as above. 2. Bilateral pulmonary infiltrates suspicious for multifocal infection and/or edema. Electronically Signed   By: Audie Pinto M.D.   On: 05/19/2019 19:32   Dg Chest Port 1 View  Result Date: 05/19/2019 CLINICAL DATA:  Respiratory distress EXAM: PORTABLE CHEST 1 VIEW COMPARISON:  Chest radiographs 05/15/2019, 05/13/2019 FINDINGS: Stable cardiomediastinal contours with enlarged heart size. Central venous congestion. Worsening bilateral multifocal airspace opacities. Possible small left pleural effusion. There is no pneumothorax. No acute osseous abnormality in the visualized  bones. IMPRESSION: Worsening bilateral multifocal airspace opacities may represent pulmonary edema or multifocal pneumonia. Electronically Signed   By: Audie Pinto M.D.   On: 05/19/2019 17:47   Dg Chest Port 1 View  Result Date: 05/15/2019 CLINICAL DATA:  Pneumonia EXAM: PORTABLE CHEST 1 VIEW COMPARISON:  05/13/2019 FINDINGS: Multifocal airspace opacities are again noted bilaterally. The heart size remains enlarged. Aortic calcifications are noted. There are likely small bilateral pleural effusions, right greater than left. There is no pneumothorax. No evidence for an acute osseous abnormality. There are prominent interstitial lung markings bilaterally. IMPRESSION: 1. Stable multifocal airspace opacities bilaterally. 2. Likely small bilateral pleural effusions, right greater than left. 3. Cardiomegaly with findings of volume overload. Electronically Signed   By: Constance Holster M.D.   On: 05/15/2019 22:44   Dg Chest Port 1 View  Result Date: 05/13/2019 CLINICAL DATA:  Dyspnea. COVID-19 positive. EXAM: PORTABLE CHEST 1 VIEW COMPARISON:  04/25/2019 FINDINGS: Unchanged heart size with mild cardiomegaly. Unchanged mediastinal contours. Aortic atherosclerosis and coronary stent visualized. Opacification of the lower right hemithorax may represent combination of pleural fluid and airspace disease. Right costophrenic angle not included in the field of view. There is a left pleural effusion, increased from prior. Pulmonary and interstitial opacities edema has progressed. IMPRESSION: 1. Opacification of the lower right hemithorax may represent combination of pleural fluid and airspace disease, pneumonia versus aspiration. 2. Increased left pleural effusion since prior exam. Progressive interstitial opacities from prior likely combination of pulmonary edema and atypical pneumonia. Electronically Signed   By: Keith Rake M.D.   On: 05/13/2019 23:22   Dg Chest Port 1 View  Result Date:  04/25/2019 CLINICAL DATA:  Fever. EXAM: PORTABLE CHEST 1 VIEW COMPARISON:  04/23/2019.  04/19/2019.  CT 11/29/2016. FINDINGS: Right PICC line noted with tip at cavoatrial junction. Endotracheal tube and feeding tube in stable position. Heart size stable. Diffuse bilateral pulmonary interstitial prominence unchanged from prior exam. Findings again consistent pneumonitis. No pleural effusion or pneumothorax. Gastric distention noted. IMPRESSION: 1. Interim placement right PICC line, its tip is in the cavoatrial junction. Endotracheal tube and feeding tube in stable position. 2. Persistent bilateral interstitial prominence consistent pneumonitis again noted. No interim change. 3.  Gastric distention. Electronically Signed   By: Marcello Moores  Register   On: 04/25/2019 09:34   Dg Chest Port 1 View  Result Date: 04/23/2019 CLINICAL DATA:  Intubated.  COVID-19 pneumonia. EXAM: PORTABLE CHEST 1 VIEW COMPARISON:  04/19/2019 chest radiograph. FINDINGS: Endotracheal tube tip is 5.0 cm above the carina. Enteric tube enters stomach with the tip not seen on this image. Left subclavian central venous catheter terminates in the middle third of the SVC. Stable cardiomediastinal silhouette with normal heart size. No pneumothorax. No pleural  effusion. Patchy hazy mid to lower lung opacities bilaterally not appreciably changed. IMPRESSION: 1. Well-positioned support structures.  No pneumothorax. 2. No significant change in hazy patchy bilateral mid to lower lung opacities compatible with multilobar pneumonia. Electronically Signed   By: Ilona Sorrel M.D.   On: 04/23/2019 15:37   Dg Abd Portable 1v  Result Date: 05/19/2019 CLINICAL DATA:  Intubation.  OG tube placement.  COVID-19 pneumonia. EXAM: PORTABLE ABDOMEN - 1 VIEW COMPARISON:  One-view abdomen 05/13/2019. FINDINGS: The side port of the NG tube is in the fundus the stomach. Heart size normal. Bilateral lower lobe pneumonia is present. A left pleural effusion is present.  IMPRESSION: 1. The side port of the NG tube is in the fundus of the stomach. 2. Bilateral lower lobe airspace disease consistent with known COVID-19 pneumonia. 3. Left pleural effusion. Electronically Signed   By: San Morelle M.D.   On: 05/19/2019 19:42   Dg Swallowing Func-speech Pathology  Result Date: 05/05/2019 Objective Swallowing Evaluation: Type of Study: MBS-Modified Barium Swallow Study  Patient Details Name: YORDI PANKRATZ MRN: [id number] Date of Birth: 10/22/1950 Today's Date: 05/05/2019 Time: SLP Start Time (ACUTE ONLY): 1030 -SLP Stop Time (ACUTE ONLY): 1100 SLP Time Calculation (min) (ACUTE ONLY): 30 min Past Medical History: Past Medical History: Diagnosis Date . Anxiety  . COPD (chronic obstructive pulmonary disease) ([place])  . Coronary artery disease  . Full dentures  . GERD (gastroesophageal reflux disease)   Rolaids as needed . High cholesterol  . History of MI (myocardial infarction)  . Hypertension   med. dosage increased 04/2016; has been on BP med. x 10 yrs. . Incarcerated umbilical hernia 0000000 . Inguinal hernia 05/2016  bilateral  . Insulin dependent diabetes mellitus ([place])  . Ischemic cardiomyopathy   a. 03/2015 EF 40-45% by LV gram. . Left leg cellulitis 10/08/2016 . Rheumatoid arthritis(714.0)  Past Surgical History: Past Surgical History: Procedure Laterality Date . CARDIAC CATHETERIZATION N/A 02/01/2015  Procedure: Left Heart Cath and Coronary Angiography;  Surgeon: Belva Crome, MD;  Location: [place] [facility];  Service: Cardiovascular;  Laterality: N/A; . CARDIAC CATHETERIZATION N/A 03/22/2015  Procedure: Left Heart Cath and Coronary Angiography;  Surgeon: Leonie Man, MD;  Location: [place] [facility];  Service: Cardiovascular;  Laterality: N/A; . CARDIAC CATHETERIZATION  09/05/2002; 03/09/2003; 04/10/2005;06/02/2005; 05/09/2006; 02/09/2007; 03/01/2007 . COLONOSCOPY  2008  Dr. Gala Romney: diverticulosis, hyperplastic polyp. . CORONARY ANGIOPLASTY  11/14/2012; 02/01/2015 . CORONARY  ANGIOPLASTY WITH STENT PLACEMENT  05/16/2012  "1; makes total ~ 4" . CORONARY STENT INTERVENTION N/A 06/06/2017  Procedure: CORONARY STENT INTERVENTION;  Surgeon: Belva Crome, MD;  Location: [place] [facility];  Service: Cardiovascular;  Laterality: N/A; . INSERTION OF MESH Bilateral 05/24/2016  Procedure: INSERTION OF MESH;  Surgeon: Coralie Keens, MD;  Location: [place];  Service: General;  Laterality: Bilateral; . LAPAROSCOPIC CHOLECYSTECTOMY   . LAPAROSCOPIC INGUINAL HERNIA WITH UMBILICAL HERNIA Bilateral 05/24/2016  Procedure: BILATERAL LAPAROSCOPIC INGUINAL HERNIA REPAIR WITH MESH AND UMBILICAL HERNIA REPAIR WITH MESH;  Surgeon: Coralie Keens, MD;  Location: [place];  Service: General;  Laterality: Bilateral; . LEFT HEART CATH AND CORONARY ANGIOGRAPHY N/A 06/06/2017  Procedure: LEFT HEART CATH AND CORONARY ANGIOGRAPHY;  Surgeon: Belva Crome, MD;  Location: [place] [facility];  Service: Cardiovascular;  Laterality: N/A; . LEFT HEART CATHETERIZATION WITH CORONARY ANGIOGRAM N/A 12/22/2011  Procedure: LEFT HEART CATHETERIZATION WITH CORONARY ANGIOGRAM;  Surgeon: Troy Sine, MD;  Location: [facility] CATH LAB;  Service:  Cardiovascular;  Laterality: N/A; . LEFT HEART CATHETERIZATION WITH CORONARY ANGIOGRAM Bilateral 05/16/2012  Procedure: LEFT HEART CATHETERIZATION WITH CORONARY ANGIOGRAM;  Surgeon: Lorretta Harp, MD;  Location: [facility] CATH LAB;  Service: Cardiovascular;  Laterality: Bilateral; . LEFT HEART CATHETERIZATION WITH CORONARY ANGIOGRAM N/A 11/14/2012  Procedure: LEFT HEART CATHETERIZATION WITH CORONARY ANGIOGRAM;  Surgeon: Troy Sine, MD;  Location: [facility] CATH LAB;  Service: Cardiovascular;  Laterality: N/A; . LEFT HEART CATHETERIZATION WITH CORONARY ANGIOGRAM N/A 09/01/2013  Procedure: LEFT HEART CATHETERIZATION WITH CORONARY ANGIOGRAM;  Surgeon: Leonie Man, MD;  Location: [facility] CATH LAB;  Service: Cardiovascular;  Laterality: N/A; . [facility]; 1973; 1985   x 3 . NM MYOCAR PERF WALL MOTION  08/30/2009  No significant ischemia . OPEN REDUCTION INTERNAL FIXATION (ORIF) DISTAL RADIAL FRACTURE Right 12/10/2016  Procedure: OPEN REDUCTION INTERNAL FIXATION (ORIF) DISTAL RADIAL FRACTURE;  Surgeon: Iran Planas, MD;  Location: [place];  Service: Orthopedics;  Laterality: Right; . PERCUTANEOUS CORONARY INTERVENTION-BALLOON ONLY  12/22/2011  Procedure: PERCUTANEOUS CORONARY INTERVENTION-BALLOON ONLY;  Surgeon: Troy Sine, MD;  Location: [facility] CATH LAB;  Service: Cardiovascular;; . PERCUTANEOUS CORONARY INTERVENTION-BALLOON ONLY  11/14/2012  Procedure: PERCUTANEOUS CORONARY INTERVENTION-BALLOON ONLY;  Surgeon: Troy Sine, MD;  Location: [facility] CATH LAB;  Service: Cardiovascular;; . PERCUTANEOUS CORONARY STENT INTERVENTION (PCI-S)  05/16/2012  Procedure: PERCUTANEOUS CORONARY STENT INTERVENTION (PCI-S);  Surgeon: Lorretta Harp, MD;  Location: [facility] CATH LAB;  Service: Cardiovascular;; . PERCUTANEOUS CORONARY STENT INTERVENTION (PCI-S)  09/01/2013  Procedure: PERCUTANEOUS CORONARY STENT INTERVENTION (PCI-S);  Surgeon: Leonie Man, MD;  Location: [facility] CATH LAB;  Service: Cardiovascular;; HPI: 68 year old male with history of COPD, CAD, diabetes mellitus, ischemic cardiomyopathy, RA, presented to the hospital and was admitted on 04/11/2019 with shortness of breath, he was found to be significantly hypoxic, tested positive for COVID-19, was intubated and transferred to [facility] for further management. Intubated 8/1-8/2, reintubated 8/3-8/22.  No data recorded Assessment / Plan / Recommendation CHL IP CLINICAL IMPRESSIONS 05/05/2019 Clinical Impression Pt demonstrates a moderate pharyngeal dysphagia with primary deficits associated with laryngeal closure and pharyngeal strength. Pt initially tested with Cortrak in place, and then removed mid test. Pt demonstrates decreased vestibular closure resulting in consistent penetration across consistencies. With thin liquid, there was also mild sensed  aspiration (without ejection) as glottic closure released while significant penetrates were still present. Performance was similar between honey thick liquids and nectar, though nectar residue transited a bit easier. In these instances, even with consecutive swallows with large boluses pt penetrated to the cords and had mild to moderate vallecular residue post swallow. Subsequent swallows and throat clears (sensed but cueing needed for appropriate effort) ejected penetrate and cleared residue. Again, this also improved somewhat after Cortrak removal. A chin tuck was attempted but provided no significant benefit. Pt is recommended to initaite a dys 3 (mech soft) diet with nectar thick liquids with reminders for intermittent throat clearing and second swallows. Expect function to improve as vocal quality also improves but RMT may be beneficial for pharyngeal and respiratory strength.  SLP Visit Diagnosis Dysphagia, oropharyngeal phase (R13.12) Attention and concentration deficit following -- Frontal lobe and executive function deficit following -- Impact on safety and function Moderate aspiration risk   CHL IP TREATMENT RECOMMENDATION 05/05/2019 Treatment Recommendations Therapy as outlined in treatment plan below   Prognosis 05/05/2019 Prognosis for Safe Diet Advancement Good Barriers to Reach Goals -- Barriers/Prognosis Comment -- CHL IP DIET RECOMMENDATION 05/05/2019 SLP Diet Recommendations Dysphagia 3 (Mech soft) solids;Nectar thick  liquid Liquid Administration via Cup;Straw Medication Administration Whole meds with liquid Compensations Multiple dry swallows after each bite/sip;Clear throat intermittently Postural Changes Seated upright at 90 degrees;Remain semi-upright after after feeds/meals (Comment)   CHL IP OTHER RECOMMENDATIONS 05/03/2019 Recommended Consults -- Oral Care Recommendations -- Other Recommendations Order thickener from pharmacy   CHL IP FOLLOW UP RECOMMENDATIONS 05/05/2019 Follow up Recommendations  Inpatient Rehab   CHL IP FREQUENCY AND DURATION 05/05/2019 Speech Therapy Frequency (ACUTE ONLY) min 2x/week Treatment Duration 2 weeks      CHL IP ORAL PHASE 05/05/2019 Oral Phase WFL Oral - Pudding Teaspoon -- Oral - Pudding Cup -- Oral - Honey Teaspoon -- Oral - Honey Cup -- Oral - Nectar Teaspoon -- Oral - Nectar Cup -- Oral - Nectar Straw -- Oral - Thin Teaspoon -- Oral - Thin Cup -- Oral - Thin Straw -- Oral - Puree -- Oral - Mech Soft -- Oral - Regular -- Oral - Multi-Consistency -- Oral - Pill -- Oral Phase - Comment --  CHL IP PHARYNGEAL PHASE 05/05/2019 Pharyngeal Phase Impaired Pharyngeal- Pudding Teaspoon -- Pharyngeal -- Pharyngeal- Pudding Cup -- Pharyngeal -- Pharyngeal- Honey Teaspoon -- Pharyngeal -- Pharyngeal- Honey Cup Penetration/Aspiration during swallow;Reduced epiglottic inversion;Reduced airway/laryngeal closure;Pharyngeal residue - valleculae Pharyngeal Material enters airway, remains ABOVE vocal cords and not ejected out Pharyngeal- Nectar Teaspoon -- Pharyngeal -- Pharyngeal- Nectar Cup Penetration/Aspiration during swallow;Reduced epiglottic inversion;Reduced airway/laryngeal closure;Pharyngeal residue - valleculae Pharyngeal Material enters airway, remains ABOVE vocal cords and not ejected out;Material does not enter airway;Material enters airway, remains ABOVE vocal cords then ejected out;Material enters airway, CONTACTS cords and then ejected out Pharyngeal- Nectar Straw Penetration/Aspiration during swallow;Reduced epiglottic inversion;Reduced airway/laryngeal closure;Pharyngeal residue - valleculae Pharyngeal Material enters airway, remains ABOVE vocal cords and not ejected out;Material does not enter airway;Material enters airway, remains ABOVE vocal cords then ejected out;Material enters airway, CONTACTS cords and then ejected out Pharyngeal- Thin Teaspoon -- Pharyngeal -- Pharyngeal- Thin Cup -- Pharyngeal -- Pharyngeal- Thin Straw Penetration/Aspiration during  swallow;Penetration/Apiration after swallow;Trace aspiration;Pharyngeal residue - valleculae;Reduced airway/laryngeal closure;Reduced epiglottic inversion Pharyngeal Material enters airway, passes BELOW cords and not ejected out despite cough attempt by patient Pharyngeal- Puree Reduced epiglottic inversion;Pharyngeal residue - valleculae Pharyngeal -- Pharyngeal- Mechanical Soft -- Pharyngeal -- Pharyngeal- Regular Pharyngeal residue - valleculae;Reduced epiglottic inversion Pharyngeal -- Pharyngeal- Multi-consistency -- Pharyngeal -- Pharyngeal- Pill -- Pharyngeal -- Pharyngeal Comment --  No flowsheet data found. [name], MA CCC-SLP Acute Rehabilitation Services Pager [phone] Office [phone] [name] 05/05/2019, 11:52 AM              [place] Ekg Site Rite  Result Date: 04/23/2019 If [facility] image not attached, placement could not be confirmed due to current cardiac rhythm.     Procedures:  8/1 ETT >8/2, 8/3 >8/22 8/1 L subclavian CVL >8/13 8/13 Rt PICC ETT 9/7>>9/18 August 2018 echo that showed an EF of 50% left atrium dilated. CT of the head on 08/01/2019 that showed mild related age atrophy chronic microvascular changes and old lacunar infarct no acute findings.  Significant events: 04/12/2019 admission to [facility] CICU 18 2020 extubated 04/14/2019 early a.m. agitation placed on BiPAP requiring intubation. 04/15/2019 started on steroids for COPD exacerbation. 04/19/2019 septic shock requiring pressor support 04/20/2019 off pressors respiratory cultures grew positive for staph aureus. 04/20/2019 persistent encephalopathy off sedation CT of the head showed no acute findings. 04/22/19/2020 encephalopathy improved 04/23/2019 wean was difficult due to tachypnea, his ETT was exchanged due to dry blood and clot burden. 04/24/2019  bleeding from subclavian central line, exchanged for a PICC line. 04/28/2019 started to wean on PRVC 14 and 5. 05/03/2019 successfully  extubated. 05/13/2019 back in the ICU after after an aspiration event with elevated cardiac biomarkers. with elevated troponins which have trended, cardiology was consulted and recommended conservative management. 05/19/2019 back in the ICU for hypoxic respiratory failure requiring intubation due to an episode of flash pulmonary edema  05/20/2019 successfully extubated and now on 1 L of oxygen.  Subjective: Patient is feeling better his dyspnea continue to improve, no nausea or vomiting, no chest pain. Continue to be very weak and deconditioned.   Discharge Exam: Vitals:   05/21/19 2313 05/22/19 0900  BP: 131/71 (!) 141/74  Pulse: 76 86  Resp:  18  Temp: 98.3 F (36.8 C) 98.1 F (36.7 C)  SpO2: 93% 93%   Vitals:   05/21/19 1638 05/21/19 2134 05/21/19 2313 05/22/19 0900  BP: 140/63 140/70 131/71 (!) 141/74  Pulse: 92 87 76 86  Resp: 16   18  Temp: 98.6 F (37 C) (!) 97.4 F (36.3 C) 98.3 F (36.8 C) 98.1 F (36.7 C)  TempSrc: Oral Oral Oral Oral  SpO2: 90% 93% 93% 93%  Weight:      Height:        General: Not in pain or dyspnea, deconditioned  Neurology: Awake and alert, non focal  E ENT: mild pallor, no icterus, oral mucosa moist Cardiovascular: No JVD. S1-S2 present, rhythmic, no gallops, rubs, or murmurs. No lower extremity edema. Pulmonary: positive breath sounds bilaterally, adequate air movement, no wheezing, rhonchi or rales. Gastrointestinal. Abdomen with no organomegaly, non tender, no rebound or guarding Skin. No rashes Musculoskeletal: no joint deformities   The results of significant diagnostics from this hospitalization (including imaging, microbiology, ancillary and laboratory) are listed below for reference.     Microbiology: Recent Results (from the past 240 hour(s))  MRSA PCR Screening     Status: Abnormal   Collection Time: 05/13/19 11:29 PM   Specimen: Nasopharyngeal  Result Value Ref Range Status   MRSA by PCR POSITIVE (A) NEGATIVE Final    Comment:         The GeneXpert MRSA Assay (FDA approved for NASAL specimens only), is one component of a comprehensive MRSA colonization surveillance program. It is not intended to diagnose MRSA infection nor to guide or monitor treatment for MRSA infections. CRITICAL RESULT CALLED TO, READ BACK BY AND VERIFIED WITH: MOSLEY, L. RN @0800  ON 9.2.2020 BY [facility] Performed at [facility] [address], [place], [place] [address]   Novel Coronavirus, NAA (hospital order; send-out to ref lab)     Status: None   Collection Time: 05/14/19 12:17 PM   Specimen: Nasopharyngeal Swab; Respiratory  Result Value Ref Range Status   SARS-CoV-2, NAA NOT DETECTED NOT DETECTED Final    Comment: (NOTE) This nucleic acid amplification test was developed and its performance characteristics determined by [employer]. Nucleic acid amplification tests include PCR and TMA. This test has not been FDA cleared or approved. This test has been authorized by FDA under an Emergency Use Authorization (EUA). This test is only authorized for the duration of time the declaration that circumstances exist justifying the authorization of the emergency use of in vitro diagnostic tests for detection of SARS-CoV-2 virus and/or diagnosis of COVID-19 infection under section 564(b)(1) of the Act, 21 U.S.C. PT:[id number]) (1), unless the authorization is terminated or revoked sooner. When diagnostic testing is negative, the possibility of a false  negative result should be considered in the context of a patient's recent exposures and the presence of clinical signs and symptoms consistent with COVID-19. An individual without symptoms of COVID- 19 and who is not shedding SARS-CoV-2 vi rus would expect to have a negative (not detected) result in this assay. Performed At: [facility] [place], [place] [id number] Rush Farmer MD [id number]    [place]  Final     Comment: Performed at [facility] [address] [place],  [address]     Labs: BNP (last 3 results) Recent Labs    08/26/18 2359 05/14/19 0801  BNP 247.0* 123XX123*   Basic Metabolic Panel: Recent Labs  Lab 05/18/19 0112 05/19/19 0405  05/19/19 1915 05/19/19 1943 05/20/19 0530 05/21/19 0340 05/22/19 0407  NA 138  --    < > 136 136 137 140 135  K 3.9  --    < > 4.3 4.6 4.2 4.3 2.9*  CL 95*  --   --  96*  --  96* 100 99  CO2 33*  --   --  28  --  28 29 25   GLUCOSE 125*  --   --  266*  --  87 85 229*  BUN 22  --   --  23  --  25* 26* 16  CREATININE 1.40*  --   --  1.34*  --  1.61* 1.59* 1.37*  CALCIUM 8.5*  --   --  9.1  --  9.1 9.0 8.5*  MG 1.7 2.0  --  1.9  --  2.2 2.2 1.9  PHOS  --   --   --   --   --  4.6 5.1* 3.9   < > = values in this interval not displayed.   Liver Function Tests: Recent Labs  Lab 05/18/19 0112 05/19/19 1915 05/20/19 0530 05/21/19 0340 05/22/19 0407  AST 44* 37 27 36 27  ALT 79* 80* 68* 63* 55*  ALKPHOS 131* 138* 120 111 107  BILITOT 0.2* 0.6 0.3 0.5 0.4  PROT 6.4* 7.3 7.0 6.7 6.6  ALBUMIN 2.1* 2.5* 2.3* 2.3* 2.0*   No results for input(s): LIPASE, AMYLASE in the last 168 hours. No results for input(s): AMMONIA in the last 168 hours. CBC: Recent Labs  Lab 05/17/19 0114 05/18/19 0112 05/19/19 1745 05/19/19 1943 05/20/19 0530 05/21/19 0340 05/22/19 0407  WBC 5.9 5.9  --   --  7.4 7.2 5.9  NEUTROABS  --   --   --   --  5.4 5.1 4.5  HGB 8.3* 8.3* 10.2* 9.5* 8.3* 8.5* 7.8*  HCT 26.2* 25.6* 30.0* 28.0* 26.2* 27.2* 24.3*  MCV 93.9 94.8  --   --  93.9 94.8 93.1  PLT 159 155  --   --  245 210 218   Cardiac Enzymes: No results for input(s): CKTOTAL, CKMB, CKMBINDEX, TROPONINI in the last 168 hours. BNP: Invalid input(s): POCBNP CBG: Recent Labs  Lab 05/21/19 0739 05/21/19 1140 05/21/19 1630 05/21/19 2117 05/22/19 0733  GLUCAP 126* 180* 239* 302* 222*   D-Dimer No results for input(s): DDIMER in  the last 72 hours. Hgb A1c No results for input(s): HGBA1C in the last 72 hours. Lipid Profile No results for input(s): CHOL, HDL, LDLCALC, TRIG, CHOLHDL, LDLDIRECT in the last 72 hours. Thyroid function studies No results for input(s): TSH, T4TOTAL, T3FREE, THYROIDAB in the last 72 hours.  Invalid input(s): FREET3 Anemia work up No results for input(s): VITAMINB12,  FOLATE, FERRITIN, TIBC, IRON, RETICCTPCT in the last 72 hours. Urinalysis    Component Value Date/Time   COLORURINE YELLOW 04/25/2019 0826   APPEARANCEUR HAZY (A) 04/25/2019 0826   LABSPEC 1.015 04/25/2019 0826   PHURINE 5.0 04/25/2019 0826   GLUCOSEU 150 (A) 04/25/2019 0826   HGBUR LARGE (A) 04/25/2019 0826   BILIRUBINUR NEGATIVE 04/25/2019 0826   KETONESUR NEGATIVE 04/25/2019 0826   PROTEINUR 30 (A) 04/25/2019 0826   UROBILINOGEN 0.2 12/22/2011 2130   NITRITE NEGATIVE 04/25/2019 0826   LEUKOCYTESUR NEGATIVE 04/25/2019 0826   Sepsis Labs Invalid input(s): PROCALCITONIN,  WBC,  LACTICIDVEN Microbiology Recent Results (from the past 240 hour(s))  MRSA PCR Screening     Status: Abnormal   Collection Time: 05/13/19 11:29 PM   Specimen: Nasopharyngeal  Result Value Ref Range Status   MRSA by PCR POSITIVE (A) NEGATIVE Final    Comment:        The GeneXpert MRSA Assay (FDA approved for NASAL specimens only), is one component of a comprehensive MRSA colonization surveillance program. It is not intended to diagnose MRSA infection nor to guide or monitor treatment for MRSA infections. CRITICAL RESULT CALLED TO, READ BACK BY AND VERIFIED WITH: MOSLEY, L. RN @0800  ON 9.2.2020 BY [facility] Performed at [facility] [address] [place], [place] [address]   Novel Coronavirus, NAA (hospital order; send-out to ref lab)     Status: None   Collection Time: 05/14/19 12:17 PM   Specimen: Nasopharyngeal Swab; Respiratory  Result Value Ref Range Status   SARS-CoV-2, NAA NOT DETECTED NOT DETECTED Final     Comment: (NOTE) This nucleic acid amplification test was developed and its performance characteristics determined by [employer]. Nucleic acid amplification tests include PCR and TMA. This test has not been FDA cleared or approved. This test has been authorized by FDA under an Emergency Use Authorization (EUA). This test is only authorized for the duration of time the declaration that circumstances exist justifying the authorization of the emergency use of in vitro diagnostic tests for detection of SARS-CoV-2 virus and/or diagnosis of COVID-19 infection under section 564(b)(1) of the Act, 21 U.S.C. [id number]) (1), unless the authorization is terminated or revoked sooner. When diagnostic testing is negative, the possibility of a false negative result should be considered in the context of a patient's recent exposures and the presence of clinical signs and symptoms consistent with COVID-19. An individual without symptoms of COVID- 19 and who is not shedding SARS-CoV-2 vi rus would expect to have a negative (not detected) result in this assay. Performed At: [facility] [place], [place] [id number] Rush Farmer MD [id number]    [facility]  Final    Comment: Performed at [facility] [address] [place], [place] [address]     Time coordinating discharge: 45 minutes  SIGNED:   Tawni Millers, MD  [facility] 05/22/2019, 11:11 AM

## 2019-05-22 NOTE — Progress Notes (Signed)
Pt arrived to unit at 1850, pt alert, vs noted in chart, O2 at 2L via N/C. Bed in low position, call bell in reach, bed alarm on and functioning.

## 2019-05-22 NOTE — TOC Transition Note (Signed)
Transition of Care [facility]) - CM/SW Discharge Note   Patient Details  Name: Robert [name] MRN: [id number] Date of Birth: [birth year]  [facility] CM/SW Contact:  Pollie Friar, RN Phone Number: 05/22/2019, 2:07 PM   Clinical Narrative:    Pt is discharging to CIR today. CM signing off.    Final next level of care: IP Rehab Facility Barriers to Discharge: No Barriers Identified   Patient Goals and CMS Choice        Discharge Placement                       Discharge Plan and Services                                     Social Determinants of Health (SDOH) Interventions     Readmission Risk Interventions No flowsheet data found.

## 2019-05-22 NOTE — Progress Notes (Signed)
Physical Therapy Treatment Patient Details Name: Robert [name] MRN: [id number] DOB: 28-Dec-1950 Today's Date: 05/22/2019    History of Present Illness 68 yo male with PMH HTN,/CAD,GERD, DM,RA, T3 compression fx, wrist fx, COPD,ischemic myopathy presented to the hospital and was admitted on 04/11/2019 with shortness of breath, he was found to be significantly hypoxic, tested positive for COVID-19, was intubated and transferred to [facility] for further management. intubated 3x in the course of a month. He was extubated 05/03/2019 and unfortunately developed worsening respiratory failure with increased work of breathing and was transferred back to the ICU. Cardiology consulted with elevated troponins and felt this was due to CHF and hypoxia due to respiratory failure c/w aspiration. 9/7 Pt transferred to ICU and intubated, with Acute respiratory failure with hypoxemia due to recurrent cardiogenic edema from acute hypertension in setting of diastolic heart failure and CAD. pt extubated 9/8. Pt now negative for Covid.    PT Comments    Pt with good improvement with mobility and cognition. Continue to recommend comprehensive inpatient rehab (CIR) for post-acute therapy needs.    Follow Up Recommendations  CIR     Equipment Recommendations  Other (comment)(tba)    Recommendations for Other Services       Precautions / Restrictions Precautions Precautions: Fall Precaution Comments: sacral wound Restrictions Weight Bearing Restrictions: No    Mobility  Bed Mobility Overal bed mobility: Needs Assistance Bed Mobility: Supine to Sit;Sit to Supine     Supine to sit: Min guard;HOB elevated(Assist for lines/safety) Sit to supine: Min assist   General bed mobility comments: Assist to bring legs back up in bed  Transfers Overall transfer level: Needs assistance Equipment used: Rolling walker (2 wheeled) Transfers: Sit to/from Stand Sit to Stand: +2 physical assistance;Mod assist;From elevated  surface         General transfer comment: Assist to bring hips up and for balance. Pt braces posterior legs against bed  Ambulation/Gait Ambulation/Gait assistance: +2 physical assistance;Mod assist Gait Distance (Feet): 2 Feet Assistive device: Rolling walker (2 wheeled) Gait Pattern/deviations: Step-to pattern;Decreased step length - right;Decreased step length - left Gait velocity: decr Gait velocity interpretation: <1.31 ft/sec, indicative of household ambulator General Gait Details: Assist for balance and support. Pt side stepping up side of bed with posterior legs supported against bed. Knees in hyperextension   Stairs             Wheelchair Mobility    Modified Rankin (Stroke Patients Only)       Balance Overall balance assessment: Needs assistance Sitting-balance support: No upper extremity supported;Feet supported Sitting balance-Leahy Scale: Fair     Standing balance support: Bilateral upper extremity supported Standing balance-Leahy Scale: Poor Standing balance comment: walker and mod assist for static standing                            Cognition Arousal/Alertness: Awake/alert Behavior During Therapy: WFL for tasks assessed/performed Overall Cognitive Status: Impaired/Different from baseline Area of Impairment: Memory;Safety/judgement;Awareness;Problem solving                     Memory: Decreased short-term memory;Decreased recall of precautions   Safety/Judgement: Decreased awareness of safety;Decreased awareness of deficits Awareness: Emergent Problem Solving: Slow processing;Requires verbal cues General Comments: Pt with improving cognition      Exercises      General Comments General comments (skin integrity, edema, etc.): Pt on 1L of O2 with SpO2 97% with activity  Pertinent Vitals/Pain Pain Assessment: No/denies pain    Home Living                      Prior Function            PT Goals  (current goals can now be found in the care plan section) Acute Rehab PT Goals Patient Stated Goal: to get stronger Progress towards PT goals: Progressing toward goals    Frequency    Min 3X/week      PT Plan Current plan remains appropriate    Co-evaluation              AM-PAC PT "6 Clicks" Mobility   Outcome Measure  Help needed turning from your back to your side while in a flat bed without using bedrails?: A Little Help needed moving from lying on your back to sitting on the side of a flat bed without using bedrails?: A Little Help needed moving to and from a bed to a chair (including a wheelchair)?: A Lot Help needed standing up from a chair using your arms (e.g., wheelchair or bedside chair)?: A Lot Help needed to walk in hospital room?: Total Help needed climbing 3-5 steps with a railing? : Total 6 Click Score: 12    End of Session Equipment Utilized During Treatment: Oxygen;Gait belt Activity Tolerance: Patient tolerated treatment well Patient left: in bed;with call bell/phone within reach;with bed alarm set Nurse Communication: Mobility status PT Visit Diagnosis: Muscle weakness (generalized) (M62.81)     Time: 1110-1130 PT Time Calculation (min) (ACUTE ONLY): 20 min  Charges:  $Therapeutic Activity: 8-22 mins                     [place] Pager [phone] Office [place] 05/22/2019, 11:49 AM

## 2019-05-22 NOTE — H&P (Signed)
Physical Medicine and Rehabilitation Admission H&P        Chief Complaint  Patient presents with  . Debility      HPI: Robert [name] is a 68 year old male with history of CAD with ICM, PAD, SCI with LLE weakness?, T2DM, RA- on Methotrexate/actemra, anxiety disorder, COPD who was originally admitted on 04/12/19 with severe SOB, fever and cough. He was intubated in ED and found to be positive for COVID 19 and was Remdesivir and Actemra as well as steroids and broad spectrum antibiotics for MRSA PNA. [name]  Hospital course significant for  COPD exacerbation, septic shock due to MRSA PNA, AKI with electrolyte abnormality, acute urinary retention with hematuria--foley in place, as well as development of sacral decub. He tolerated extubation by 05/03/19 but had worsening of hypoxia with increase WOB, A fib with RVR and found to have rise in trop -1,269 due to demand ischemia from recurrent aspiration as well as CHF.  He was started on IV heparin, treated with Unasyn X 5 days as well as IV lasix due to acute on chronic CHF.    He continued to require HF oxygen and CIR consulted due to patient's debilitated stage and has been following for medical stability.  On 05/19/19, he developed hypoxemic respiratory failure requiring re-intubation and A fib with RVR. He was transferred to [facility] 09/08 due to concerns of need for cardiac intervention.  He was treated with IV BB as well as IV diuresis with improvement and tolerated extubation later that day. As troponin trending down without signs of ACS, Cardiology recommended that CAD to be treated medically and he was transitioned to Eliquis 9/9.  Beside swallow done and patient started on dysphagia 2, nectar liquids. Respiratory status stable and he was cleared to start CIR.       Review of Systems  HENT: Positive for hearing loss. Negative for ear pain and tinnitus.   Eyes: Negative for blurred vision and double vision.  Respiratory: Positive for wheezing. Negative  for cough, sputum production and shortness of breath.   Cardiovascular: Negative for chest pain and palpitations.  Musculoskeletal: Positive for joint pain (bilateral hips and knees). Negative for myalgias and neck pain.  Neurological: Positive for dizziness and sensory change (numb and tingling bilateral feet). Negative for tingling and headaches.  Psychiatric/Behavioral: The patient is nervous/anxious (anxious about starting rehab) and has insomnia.           Past Medical History:  Diagnosis Date  . Anxiety    . COPD (chronic obstructive pulmonary disease) ([place])    . Coronary artery disease    . Full dentures    . GERD (gastroesophageal reflux disease)      Rolaids as needed  . High cholesterol    . History of MI (myocardial infarction)    . Hypertension      med. dosage increased 04/2016; has been on BP med. x 10 yrs.  . Incarcerated umbilical hernia 0000000  . Inguinal hernia 05/2016    bilateral   . Insulin dependent diabetes mellitus ([place])    . Ischemic cardiomyopathy      a. 03/2015 EF 40-45% by LV gram.  . Left leg cellulitis 10/08/2016  . Rheumatoid arthritis(714.0)            Past Surgical History:  Procedure Laterality Date  . CARDIAC CATHETERIZATION N/A 02/01/2015    Procedure: Left Heart Cath and Coronary Angiography;  Surgeon: Belva Crome, MD;  Location: [place]  CV LAB;  Service: Cardiovascular;  Laterality: N/A;  . CARDIAC CATHETERIZATION N/A 03/22/2015    Procedure: Left Heart Cath and Coronary Angiography;  Surgeon: Leonie Man, MD;  Location: [place] CV LAB;  Service: Cardiovascular;  Laterality: N/A;  . CARDIAC CATHETERIZATION   09/05/2002; 03/09/2003; 04/10/2005;06/02/2005; 05/09/2006; 02/09/2007; 03/01/2007  . COLONOSCOPY   2008    Dr. Gala Romney: diverticulosis, hyperplastic polyp.  . CORONARY ANGIOPLASTY   11/14/2012; 02/01/2015  . CORONARY ANGIOPLASTY WITH STENT PLACEMENT   05/16/2012    "1; makes total ~ 4"  . CORONARY STENT INTERVENTION N/A 06/06/2017     Procedure: CORONARY STENT INTERVENTION;  Surgeon: Belva Crome, MD;  Location: [place] CV LAB;  Service: Cardiovascular;  Laterality: N/A;  . INSERTION OF MESH Bilateral 05/24/2016    Procedure: INSERTION OF MESH;  Surgeon: Coralie Keens, MD;  Location: [place];  Service: General;  Laterality: Bilateral;  . LAPAROSCOPIC CHOLECYSTECTOMY      . LAPAROSCOPIC INGUINAL HERNIA WITH UMBILICAL HERNIA Bilateral 05/24/2016    Procedure: BILATERAL LAPAROSCOPIC INGUINAL HERNIA REPAIR WITH MESH AND UMBILICAL HERNIA REPAIR WITH MESH;  Surgeon: Coralie Keens, MD;  Location: [place];  Service: General;  Laterality: Bilateral;  . LEFT HEART CATH AND CORONARY ANGIOGRAPHY N/A 06/06/2017    Procedure: LEFT HEART CATH AND CORONARY ANGIOGRAPHY;  Surgeon: Belva Crome, MD;  Location: [place] CV LAB;  Service: Cardiovascular;  Laterality: N/A;  . LEFT HEART CATHETERIZATION WITH CORONARY ANGIOGRAM N/A 12/22/2011    Procedure: LEFT HEART CATHETERIZATION WITH CORONARY ANGIOGRAM;  Surgeon: Troy Sine, MD;  Location: [facility] CATH LAB;  Service: Cardiovascular;  Laterality: N/A;  . LEFT HEART CATHETERIZATION WITH CORONARY ANGIOGRAM Bilateral 05/16/2012    Procedure: LEFT HEART CATHETERIZATION WITH CORONARY ANGIOGRAM;  Surgeon: Lorretta Harp, MD;  Location: [facility] CATH LAB;  Service: Cardiovascular;  Laterality: Bilateral;  . LEFT HEART CATHETERIZATION WITH CORONARY ANGIOGRAM N/A 11/14/2012    Procedure: LEFT HEART CATHETERIZATION WITH CORONARY ANGIOGRAM;  Surgeon: Troy Sine, MD;  Location: [facility] CATH LAB;  Service: Cardiovascular;  Laterality: N/A;  . LEFT HEART CATHETERIZATION WITH CORONARY ANGIOGRAM N/A 09/01/2013    Procedure: LEFT HEART CATHETERIZATION WITH CORONARY ANGIOGRAM;  Surgeon: Leonie Man, MD;  Location: [facility] CATH LAB;  Service: Cardiovascular;  Laterality: N/A;  . [place]; 1973; 1985    x 3  . NM MYOCAR PERF WALL MOTION   08/30/2009    No  significant ischemia  . OPEN REDUCTION INTERNAL FIXATION (ORIF) DISTAL RADIAL FRACTURE Right 12/10/2016    Procedure: OPEN REDUCTION INTERNAL FIXATION (ORIF) DISTAL RADIAL FRACTURE;  Surgeon: Iran Planas, MD;  Location: [place];  Service: Orthopedics;  Laterality: Right;  . PERCUTANEOUS CORONARY INTERVENTION-BALLOON ONLY   12/22/2011    Procedure: PERCUTANEOUS CORONARY INTERVENTION-BALLOON ONLY;  Surgeon: Troy Sine, MD;  Location: [facility] CATH LAB;  Service: Cardiovascular;;  . PERCUTANEOUS CORONARY INTERVENTION-BALLOON ONLY   11/14/2012    Procedure: PERCUTANEOUS CORONARY INTERVENTION-BALLOON ONLY;  Surgeon: Troy Sine, MD;  Location: [facility] CATH LAB;  Service: Cardiovascular;;  . PERCUTANEOUS CORONARY STENT INTERVENTION (PCI-S)   05/16/2012    Procedure: PERCUTANEOUS CORONARY STENT INTERVENTION (PCI-S);  Surgeon: Lorretta Harp, MD;  Location: [facility] CATH LAB;  Service: Cardiovascular;;  . PERCUTANEOUS CORONARY STENT INTERVENTION (PCI-S)   09/01/2013    Procedure: PERCUTANEOUS CORONARY STENT INTERVENTION (PCI-S);  Surgeon: Leonie Man, MD;  Location: [facility] CATH LAB;  Service: Cardiovascular;;  Family History  Problem Relation Age of Onset  . Heart disease Father    . Colon cancer Brother          early 72s  . Hypertension Mother    . Heart disease Mother    . Colon cancer Mother          died age 47. late 72s early 80s at diagnosis.   . Lung cancer Mother       Social History:  Widowed--wife passed away 2023-02-17. Lives with son (does not work) and daughter brings meals/helps with home. He was independent with cane PTA. He reports that he used to smoke 1.5 PPD but quit prior to hospitalization (due to dyspnea/pressure).  He has never used smokeless tobacco. He reports that he does not drink alcohol or use drugs.         Allergies  Allergen Reactions  . Ivp Dye [Iodinated Diagnostic Agents] Itching and Rash           Medications Prior to Admission  Medication Sig Dispense Refill  .  acarbose (PRECOSE) 100 MG tablet Take 100 mg by mouth 3 (three) times daily with meals.       [name] acetaminophen (TYLENOL) 650 MG CR tablet Take 650 mg by mouth every 8 (eight) hours as needed for pain.      [name] ALPRAZolam (XANAX) 0.5 MG tablet Take 0.5 mg by mouth 3 (three) times daily as needed for anxiety.       . AMBULATORY NON FORMULARY MEDICATION Take 600 mg by mouth daily at 12 noon. Medication Name: Dalcetrapib vs placebo (Patient taking differently: Take 600 mg by mouth every evening. Medication Name: Dalcetrapib vs placebo: Cholesterol)      . aspirin EC 81 MG EC tablet Take 1 tablet (81 mg total) by mouth daily.      [name] atorvastatin (LIPITOR) 80 MG tablet TAKE 1 TABLET DAILY AT 6 PM. (Patient taking differently: Take 80 mg by mouth daily at 6 PM. ) 90 tablet 3  . clopidogrel (PLAVIX) 75 MG tablet TAKE (1) TABLET BY MOUTH ONCE DAILY. 90 tablet 0  . fish oil-omega-3 fatty acids 1000 MG capsule Take 1 g by mouth daily.       . folic acid (FOLVITE) 1 MG tablet Take 1 mg by mouth daily.       . furosemide (LASIX) 40 MG tablet Take 80mg  po bid for 5 days then 80mg  po daily (Patient taking differently: Take 80 mg by mouth daily. ) 180 tablet 3  . gabapentin (NEURONTIN) 300 MG capsule Take 300 mg by mouth 3 (three) times daily as needed (for pain).       [name] glipiZIDE (GLUCOTROL) 10 MG tablet Take 10 mg by mouth 2 (two) times daily.      . Insulin Glargine (TOUJEO SOLOSTAR [place]) Inject 80 Units into the skin daily.      . isosorbide mononitrate (IMDUR) 60 MG 24 hr tablet Take 1 tablet (60 mg total) by mouth daily. 90 tablet 1  . losartan (COZAAR) 25 MG tablet Take 0.5 tablets (12.5 mg total) by mouth daily. 30 tablet 6  . metFORMIN (GLUCOPHAGE) 1000 MG tablet Take 1 tablet (1,000 mg total) by mouth 2 (two) times daily with a meal. (Patient taking differently: Take 1,000-1,250 mg by mouth See admin instructions. Take 1500 mg by mouth in the morning and 1000 mg in the evening)      . methotrexate 50 MG/2ML  injection Inject 2 mLs into the skin  once a week. Takes every Thursday      . metoprolol tartrate (LOPRESSOR) 25 MG tablet TAKE 1 TABLET TWICE DAILY. KEEP JULY APPOINTMENT. (Patient taking differently: Take 12.5 mg by mouth 2 (two) times daily. ) 180 tablet 0  . nitroGLYCERIN (NITROSTAT) 0.4 MG SL tablet Place 1 tablet (0.4 mg total) under the tongue every 5 (five) minutes x 3 doses as needed for chest pain. 25 tablet 2  . potassium chloride SA (K-DUR) 20 MEQ tablet 1 TABLET BY MOUTH DAILY. 30 tablet 1  . predniSONE (DELTASONE) 5 MG tablet Take 5 mg by mouth as directed. Filled on 04-10-19 # 48 dose pak ...      . primidone (MYSOLINE) 50 MG tablet Take 100 mg by mouth daily.       . Tocilizumab 162 MG/0.9ML SOSY Inject 162 mg into the skin every Monday. ACTEMRA          Drug Regimen Review  Drug regimen was reviewed and remains appropriate with no significant issues identified   Home: Home Living Family/patient expects to be discharged to:: Private residence Living Arrangements: Spouse/significant other, Children Available Help at Discharge: Family Type of Home: House Home Access: Stairs to enter Technical brewer of Steps: 3 Entrance Stairs-Rails: Left Home Layout: Multi-level, Able to live on main level with bedroom/bathroom Bathroom Shower/Tub: Chiropodist: Handicapped height Home Equipment: Andrews - single point Additional Comments: info  from previous encounter   Functional History: Prior Function Level of Independence: Independent   Functional Status:  Mobility: Bed Mobility Overal bed mobility: Needs Assistance Bed Mobility: Supine to Sit, Sit to Supine Rolling: Supervision Sidelying to sit: Min assist Supine to sit: Min guard, HOB elevated(Assist for lines/safety) Sit to supine: Min assist Sit to sidelying: Min guard, Min assist General bed mobility comments: Assist to bring legs back up in bed Transfers Overall transfer level: Needs  assistance Equipment used: Rolling walker (2 wheeled) Transfer via Lift Equipment: Stedy Transfers: Sit to/from Stand Sit to Stand: +2 physical assistance, Mod assist, From elevated surface General transfer comment: Assist to bring hips up and for balance. Pt braces posterior legs against bed Ambulation/Gait Ambulation/Gait assistance: +2 physical assistance, Mod assist Gait Distance (Feet): 2 Feet Assistive device: Rolling walker (2 wheeled) Gait Pattern/deviations: Step-to pattern, Decreased step length - right, Decreased step length - left General Gait Details: Assist for balance and support. Pt side stepping up side of bed with posterior legs supported against bed. Knees in hyperextension Gait velocity: decr Gait velocity interpretation: <1.31 ft/sec, indicative of household ambulator     ADL: ADL Overall ADL's : Needs assistance/impaired Eating/Feeding: Supervision/ safety, Set up Eating/Feeding Details (indicate cue type and reason): honey thick liquids Grooming: Minimal assistance, Sitting Grooming Details (indicate cue type and reason): difficulty reaching hair Upper Body Bathing: Minimal assistance, Sitting Lower Body Bathing: Maximal assistance, Sit to/from stand Lower Body Bathing Details (indicate cue type and reason): Pt ablet o reach and clean groin area; would most likely benefit form long handled sponge Upper Body Dressing : Moderate assistance, Sitting Lower Body Dressing: Maximal assistance, Sit to/from stand Lower Body Dressing Details (indicate cue type and reason): assist to adjust socks Toileting- Clothing Manipulation and Hygiene: Total assistance Toileting - Clothing Manipulation Details (indicate cue type and reason): condom cath placed. Pt excited about having foley removed Functional mobility during ADLs: +2 for physical assistance, Moderate assistance, Maximal assistance(using Stedy) General ADL Comments: generalized weakness affecting ADL   Cognition:  Cognition Overall Cognitive Status: Impaired/Different from  baseline Orientation Level: Oriented X4 Cognition Arousal/Alertness: Awake/alert Behavior During Therapy: WFL for tasks assessed/performed Overall Cognitive Status: Impaired/Different from baseline Area of Impairment: Memory, Safety/judgement, Awareness, Problem solving Current Attention Level: Selective Memory: Decreased short-term memory, Decreased recall of precautions Safety/Judgement: Decreased awareness of safety, Decreased awareness of deficits Awareness: Emergent Problem Solving: Slow processing, Requires verbal cues General Comments: Pt with improving cognition Difficult to assess due to: Intubated     Blood pressure (!) 141/74, pulse 86, temperature 98.1 F (36.7 C), temperature source Oral, resp. rate 18, height 6' (1.829 m), weight 106.1 kg, SpO2 93 %. Physical Exam  Nursing note and vitals reviewed. Constitutional: He is oriented to person, place, and time. He appears well-developed and well-nourished. No distress.  HENT:  Head: Normocephalic and atraumatic.  Eyes: Pupils are equal, round, and reactive to light.  Neck: Normal range of motion.  Cardiovascular: Normal rate and regular rhythm.  No murmur heard. Respiratory: Effort normal. No stridor. He has wheezes (intermittent audible wheezes with increased WOB with activity in bed. ).  O2 via [place]  GI: Soft. Bowel sounds are normal. He exhibits distension.  Musculoskeletal:        General: No deformity or edema.     Comments: LLE with min edema and stasis changes on shin with dry flaky scab.   Neurological: He is alert and oriented to person, place, and time. No cranial nerve deficit.  Reasonable insight and awareness. Functional memory. Normal language. UE 3+ prox to 4/5 distally. RLE: 2+HF, 4-KE and 4/5 ADF/PF. LLE: 2/5 HF, 2+ KE and 2-/5 ADF/PF.   Skin: Skin is warm. No rash noted. He is not diaphoretic. No erythema.  Psychiatric: He has a normal mood and  affect. His behavior is normal.  multiple decubiti noted--largest on right buttock          Lab Results Last 48 Hours        Results for orders placed or performed during the hospital encounter of 04/11/19 (from the past 48 hour(s))  Glucose, capillary     Status: None    Collection Time: 05/20/19  4:59 PM  Result Value Ref Range    Glucose-Capillary 90 70 - 99 mg/dL  Heparin level (unfractionated)     Status: None    Collection Time: 05/20/19  8:20 PM  Result Value Ref Range    Heparin Unfractionated 0.40 0.30 - 0.70 IU/mL      Comment: (NOTE) If heparin results are below expected values, and patient dosage has  been confirmed, suggest follow up testing of antithrombin III levels. Performed at [facility], [place] [address], [place], [place] [address]    Glucose, capillary     Status: None    Collection Time: 05/20/19  9:33 PM  Result Value Ref Range    Glucose-Capillary 71 70 - 99 mg/dL  Magnesium     Status: None    Collection Time: 05/21/19  3:40 AM  Result Value Ref Range    Magnesium 2.2 1.7 - 2.4 mg/dL      Comment: Performed at [facility], [place] [address] [place], [place] [address]  Comprehensive metabolic panel     Status: Abnormal    Collection Time: 05/21/19  3:40 AM  Result Value Ref Range    Sodium 140 135 - 145 mmol/L    Potassium 4.3 3.5 - 5.1 mmol/L    Chloride 100 98 - 111 mmol/L    CO2 29 22 - 32 mmol/L  Glucose, Bld 85 70 - 99 mg/dL    BUN 26 (H) 8 - 23 mg/dL    Creatinine, Ser 1.59 (H) 0.61 - 1.24 mg/dL    Calcium 9.0 8.9 - 10.3 mg/dL    Total Protein 6.7 6.5 - 8.1 g/dL    Albumin 2.3 (L) 3.5 - 5.0 g/dL    AST 36 15 - 41 U/L    ALT 63 (H) 0 - 44 U/L    Alkaline Phosphatase 111 38 - 126 U/L    Total Bilirubin 0.5 0.3 - 1.2 mg/dL    GFR calc non Af Amer 45 (L) >60 mL/min    GFR calc Af Amer 52 (L) >60 mL/min    Anion gap 11 5 - 15      Comment: Performed at [facility], [place] [address], [place], [place] [address]  CBC with Differential/Platelet     Status: Abnormal    Collection Time: 05/21/19  3:40 AM  Result Value Ref Range    WBC 7.2 4.0 - 10.5 K/uL    RBC 2.87 (L) 4.22 - 5.81 MIL/uL    Hemoglobin 8.5 (L) 13.0 - 17.0 g/dL    HCT 27.2 (L) 39.0 - 52.0 %    MCV 94.8 80.0 - 100.0 fL    MCH 29.6 26.0 - 34.0 pg    MCHC 31.3 30.0 - 36.0 g/dL    RDW 15.9 (H) 11.5 - 15.5 %    Platelets 210 150 - 400 K/uL    nRBC 0.0 0.0 - 0.2 %    Neutrophils Relative % 71 %    Neutro Abs 5.1 1.7 - 7.7 K/uL    Lymphocytes Relative 17 %    Lymphs Abs 1.3 0.7 - 4.0 K/uL    Monocytes Relative 7 %    Monocytes Absolute 0.5 0.1 - 1.0 K/uL    Eosinophils Relative 4 %    Eosinophils Absolute 0.3 0.0 - 0.5 K/uL    Basophils Relative 0 %    Basophils Absolute 0.0 0.0 - 0.1 K/uL    Immature Granulocytes 1 %    Abs Immature Granulocytes 0.05 0.00 - 0.07 K/uL      Comment: Performed at [facility], [place] [address], [place], [place] [address]  Phosphorus     Status: Abnormal    Collection Time: 05/21/19  3:40 AM  Result Value Ref Range    Phosphorus 5.1 (H) 2.5 - 4.6 mg/dL      Comment: Performed at [facility], [place] [address] [place], [place] [address]  Heparin level (unfractionated)     Status: None    Collection Time: 05/21/19  3:40 AM  Result Value Ref Range    Heparin Unfractionated 0.49 0.30 - 0.70 IU/mL      Comment: (NOTE) If heparin results are below expected values, and patient dosage has  been confirmed, suggest follow up testing of antithrombin III levels. Performed at [facility], [place] [address] [place], [place] [address]    Glucose, capillary     Status: Abnormal    Collection Time: 05/21/19  7:39 AM  Result Value Ref Range    Glucose-Capillary 126 (H) 70 - 99 mg/dL  Glucose, capillary     Status: Abnormal    Collection Time: 05/21/19 11:40 AM  Result Value Ref Range    Glucose-Capillary 180 (H) 70 - 99 mg/dL   Glucose, capillary     Status: Abnormal    Collection Time: 05/21/19  4:30 PM  Result Value Ref Range    Glucose-Capillary 239 (H) 70 - 99 mg/dL  Glucose, capillary     Status: Abnormal    Collection Time: 05/21/19  9:17 PM  Result Value Ref Range    Glucose-Capillary 302 (H) 70 - 99 mg/dL  Magnesium     Status: None    Collection Time: 05/22/19  4:07 AM  Result Value Ref Range    Magnesium 1.9 1.7 - 2.4 mg/dL      Comment: Performed at [facility], [place] [address], [place], [place] [address]  Comprehensive metabolic panel     Status: Abnormal    Collection Time: 05/22/19  4:07 AM  Result Value Ref Range    Sodium 135 135 - 145 mmol/L    Potassium 2.9 (L) 3.5 - 5.1 mmol/L    Chloride 99 98 - 111 mmol/L    CO2 25 22 - 32 mmol/L    Glucose, Bld 229 (H) 70 - 99 mg/dL    BUN 16 8 - 23 mg/dL    Creatinine, Ser 1.37 (H) 0.61 - 1.24 mg/dL    Calcium 8.5 (L) 8.9 - 10.3 mg/dL    Total Protein 6.6 6.5 - 8.1 g/dL    Albumin 2.0 (L) 3.5 - 5.0 g/dL    AST 27 15 - 41 U/L    ALT 55 (H) 0 - 44 U/L    Alkaline Phosphatase 107 38 - 126 U/L    Total Bilirubin 0.4 0.3 - 1.2 mg/dL    GFR calc non Af Amer 53 (L) >60 mL/min    GFR calc Af Amer >60 >60 mL/min    Anion gap 11 5 - 15      Comment: Performed at [facility], [place] [address], [place], [place] [address]  CBC with Differential/Platelet     Status: Abnormal    Collection Time: 05/22/19  4:07 AM  Result Value Ref Range    WBC 5.9 4.0 - 10.5 K/uL    RBC 2.61 (L) 4.22 - 5.81 MIL/uL    Hemoglobin 7.8 (L) 13.0 - 17.0 g/dL    HCT 24.3 (L) 39.0 - 52.0 %    MCV 93.1 80.0 - 100.0 fL    MCH 29.9 26.0 - 34.0 pg    MCHC 32.1 30.0 - 36.0 g/dL    RDW 15.4 11.5 - 15.5 %    Platelets 218 150 - 400 K/uL    nRBC 0.0 0.0 - 0.2 %    Neutrophils Relative % 77 %    Neutro Abs 4.5 1.7 - 7.7 K/uL    Lymphocytes Relative 13 %    Lymphs Abs 0.8 0.7 - 4.0 K/uL    Monocytes Relative 7 %    Monocytes Absolute 0.4 0.1 - 1.0 K/uL    Eosinophils  Relative 2 %    Eosinophils Absolute 0.1 0.0 - 0.5 K/uL    Basophils Relative 0 %    Basophils Absolute 0.0 0.0 - 0.1 K/uL    Immature Granulocytes 1 %    Abs Immature Granulocytes 0.04 0.00 - 0.07 K/uL      Comment: Performed at [place] [address], [place], [place] [address]  Phosphorus     Status: None    Collection Time: 05/22/19  4:07 AM  Result Value Ref Range    Phosphorus 3.9 2.5 - 4.6 mg/dL      Comment: Performed at [place],  [place] [address]  Glucose, capillary     Status: Abnormal    Collection Time: 05/22/19  7:33 AM  Result Value Ref Range    Glucose-Capillary 222 (H) 70 - 99 mg/dL  Glucose, capillary     Status: Abnormal    Collection Time: 05/22/19 11:56 AM  Result Value Ref Range    Glucose-Capillary 282 (H) 70 - 99 mg/dL      Imaging Results (Last 48 hours)  Dg Swallowing Func-speech Pathology   Result Date: 05/22/2019 Objective Swallowing Evaluation: Type of Study: MBS-Modified Barium Swallow Study  Patient Details Name: YOREL OBARR MRN: [id number] Date of Birth: 23-Mar-1951 Today's Date: 05/22/2019 Time: SLP Start Time (ACUTE ONLY): 1008 -SLP Stop Time (ACUTE ONLY): 1026 SLP Time Calculation (min) (ACUTE ONLY): 18 min Past Medical History: Past Medical History: Diagnosis Date . Anxiety  . COPD (chronic obstructive pulmonary disease) ([place])  . Coronary artery disease  . Full dentures  . GERD (gastroesophageal reflux disease)   Rolaids as needed . High cholesterol  . History of MI (myocardial infarction)  . Hypertension   med. dosage increased 04/2016; has been on BP med. x 10 yrs. . Incarcerated umbilical hernia 0000000 . Inguinal hernia 05/2016  bilateral  . Insulin dependent diabetes mellitus ([place])  . Ischemic cardiomyopathy   a. 03/2015 EF 40-45% by LV gram. . Left leg cellulitis 10/08/2016 . Rheumatoid arthritis(714.0)  Past Surgical History: Past Surgical History: Procedure Laterality Date . CARDIAC CATHETERIZATION N/A  02/01/2015  Procedure: Left Heart Cath and Coronary Angiography;  Surgeon: Belva Crome, MD;  Location: [place] CV LAB;  Service: Cardiovascular;  Laterality: N/A; . CARDIAC CATHETERIZATION N/A 03/22/2015  Procedure: Left Heart Cath and Coronary Angiography;  Surgeon: Leonie Man, MD;  Location: [place] CV LAB;  Service: Cardiovascular;  Laterality: N/A; . CARDIAC CATHETERIZATION  09/05/2002; 03/09/2003; 04/10/2005;06/02/2005; 05/09/2006; 02/09/2007; 03/01/2007 . COLONOSCOPY  2008  Dr. Gala Romney: diverticulosis, hyperplastic polyp. . CORONARY ANGIOPLASTY  11/14/2012; 02/01/2015 . CORONARY ANGIOPLASTY WITH STENT PLACEMENT  05/16/2012  "1; makes total ~ 4" . CORONARY STENT INTERVENTION N/A 06/06/2017  Procedure: CORONARY STENT INTERVENTION;  Surgeon: Belva Crome, MD;  Location: [place] [facility];  Service: Cardiovascular;  Laterality: N/A; . INSERTION OF MESH Bilateral 05/24/2016  Procedure: INSERTION OF MESH;  Surgeon: Coralie Keens, MD;  Location: [place];  Service: General;  Laterality: Bilateral; . LAPAROSCOPIC CHOLECYSTECTOMY   . LAPAROSCOPIC INGUINAL HERNIA WITH UMBILICAL HERNIA Bilateral 05/24/2016  Procedure: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

## 2019-05-22 NOTE — Progress Notes (Signed)
Inpatient Rehab Admissions Coordinator:    Received approval from Dr. Cathlean Sauer for pt to admit to CIR today.  I will let pt/family and CM know.   Shann Medal, PT, DPT Admissions Coordinator [phone] 05/22/19  11:11 AM

## 2019-05-22 NOTE — Research (Signed)
DAL-Gene research study visit 12 completed while patient was here in Room 2w11. Patient doing much better, possible discharge today to Inpatient Rehab. Patient took his last dose of Study drug 11-Apr-2019. He has not restarted due to Inpatient hospitalization requiring Intubation (COVID-19). VS and medications will be obtained from Epic. I told patient I would follow up with him with study updates at a later time.

## 2019-05-23 ENCOUNTER — Inpatient Hospital Stay (HOSPITAL_COMMUNITY): Payer: Medicare HMO | Admitting: Speech Pathology

## 2019-05-23 ENCOUNTER — Inpatient Hospital Stay (HOSPITAL_COMMUNITY): Payer: Medicare HMO

## 2019-05-23 ENCOUNTER — Encounter (HOSPITAL_COMMUNITY): Payer: Self-pay

## 2019-05-23 ENCOUNTER — Inpatient Hospital Stay (HOSPITAL_COMMUNITY): Payer: Medicare HMO | Admitting: Physical Therapy

## 2019-05-23 DIAGNOSIS — F411 Generalized anxiety disorder: Secondary | ICD-10-CM

## 2019-05-23 DIAGNOSIS — E876 Hypokalemia: Secondary | ICD-10-CM

## 2019-05-23 LAB — GLUCOSE, CAPILLARY
Glucose-Capillary: 167 mg/dL — ABNORMAL HIGH (ref 70–99)
Glucose-Capillary: 269 mg/dL — ABNORMAL HIGH (ref 70–99)
Glucose-Capillary: 168 mg/dL — ABNORMAL HIGH (ref 70–99)
Glucose-Capillary: 183 mg/dL — ABNORMAL HIGH (ref 70–99)

## 2019-05-23 LAB — CBC WITH DIFFERENTIAL/PLATELET
Abs Immature Granulocytes: 0.04 10*3/uL (ref 0.00–0.07)
Basophils Absolute: 0 10*3/uL (ref 0.0–0.1)
Basophils Relative: 1 %
Eosinophils Absolute: 0.1 10*3/uL (ref 0.0–0.5)
Eosinophils Relative: 2 %
HCT: 28.5 % — ABNORMAL LOW (ref 39.0–52.0)
Hemoglobin: 9.1 g/dL — ABNORMAL LOW (ref 13.0–17.0)
Immature Granulocytes: 1 %
Lymphocytes Relative: 14 %
Lymphs Abs: 0.9 10*3/uL (ref 0.7–4.0)
MCH: 29.7 pg (ref 26.0–34.0)
MCHC: 31.9 g/dL (ref 30.0–36.0)
MCV: 93.1 fL (ref 80.0–100.0)
Monocytes Absolute: 0.4 10*3/uL (ref 0.1–1.0)
Monocytes Relative: 7 %
Neutro Abs: 4.7 10*3/uL (ref 1.7–7.7)
Neutrophils Relative %: 75 %
Platelets: 248 10*3/uL (ref 150–400)
RBC: 3.06 MIL/uL — ABNORMAL LOW (ref 4.22–5.81)
RDW: 15.4 % (ref 11.5–15.5)
WBC: 6.1 10*3/uL (ref 4.0–10.5)
nRBC: 0 % (ref 0.0–0.2)

## 2019-05-23 LAB — COMPREHENSIVE METABOLIC PANEL
ALT: 58 U/L — ABNORMAL HIGH (ref 0–44)
AST: 30 U/L (ref 15–41)
Albumin: 2.2 g/dL — ABNORMAL LOW (ref 3.5–5.0)
Alkaline Phosphatase: 105 U/L (ref 38–126)
Anion gap: 7 (ref 5–15)
BUN: 14 mg/dL (ref 8–23)
CO2: 30 mmol/L (ref 22–32)
Calcium: 8.8 mg/dL — ABNORMAL LOW (ref 8.9–10.3)
Chloride: 101 mmol/L (ref 98–111)
Creatinine, Ser: 1.3 mg/dL — ABNORMAL HIGH (ref 0.61–1.24)
GFR calc Af Amer: >60 mL/min (ref >60)
GFR calc non Af Amer: 57 mL/min — ABNORMAL LOW (ref >60)
Glucose, Bld: 179 mg/dL — ABNORMAL HIGH (ref 70–99)
Potassium: 4.3 mmol/L (ref 3.5–5.1)
Sodium: 138 mmol/L (ref 135–145)
Total Bilirubin: 0.3 mg/dL (ref 0.3–1.2)
Total Protein: 6.8 g/dL (ref 6.5–8.1)

## 2019-05-23 LAB — RETICULOCYTES
Immature Retic Fract: 11.4 % (ref 2.3–15.9)
RBC.: 3.06 MIL/uL — ABNORMAL LOW (ref 4.22–5.81)
Retic Count, Absolute: 51.4 10*3/uL (ref 19.0–186.0)
Retic Ct Pct: 1.7 % (ref 0.4–3.1)

## 2019-05-23 LAB — FERRITIN: Ferritin: 381 ng/mL — ABNORMAL HIGH (ref 24–336)

## 2019-05-23 LAB — IRON AND TIBC
Iron: 38 ug/dL — ABNORMAL LOW (ref 45–182)
Saturation Ratios: 17 % — ABNORMAL LOW (ref 17.9–39.5)
TIBC: 220 ug/dL — ABNORMAL LOW (ref 250–450)
UIBC: 182 ug/dL

## 2019-05-23 LAB — VITAMIN B12: Vitamin B-12: 412 pg/mL (ref 180–914)

## 2019-05-23 LAB — FOLATE: Folate: 37.6 ng/mL (ref >5.9)

## 2019-05-23 LAB — MAGNESIUM: Magnesium: 1.8 mg/dL (ref 1.7–2.4)

## 2019-05-23 MED ORDER — POTASSIUM CHLORIDE CRYS ER 20 MEQ PO TBCR
20.0000 meq | EXTENDED_RELEASE_TABLET | Freq: Three times a day (TID) | ORAL | Status: DC
  Administered 2019-05-23 – 2019-05-26 (×10): 20 meq via ORAL
  Filled 2019-05-23 (×10): qty 1

## 2019-05-23 MED ORDER — ALPRAZOLAM 0.5 MG PO TABS: 0.5000 mg | ORAL_TABLET | Freq: Two times a day (BID) | ORAL | Status: DC | PRN

## 2019-05-23 MED ORDER — IPRATROPIUM-ALBUTEROL 0.5-2.5 (3) MG/3ML IN SOLN
3.0000 mL | Freq: Three times a day (TID) | RESPIRATORY_TRACT | Status: DC
  Administered 2019-05-23 – 2019-05-26 (×8): 3 mL via RESPIRATORY_TRACT
  Filled 2019-05-23 (×8): qty 3

## 2019-05-23 MED ORDER — FUROSEMIDE 10 MG/ML IJ SOLN
40.0000 mg | Freq: Every day | INTRAMUSCULAR | Status: DC
  Administered 2019-05-23: 40 mg via INTRAVENOUS
  Filled 2019-05-23: qty 4

## 2019-05-23 MED ORDER — FUROSEMIDE 40 MG PO TABS
40.0000 mg | ORAL_TABLET | Freq: Every day | ORAL | Status: DC
  Administered 2019-05-24 – 2019-06-06 (×14): 40 mg via ORAL
  Filled 2019-05-23 (×14): qty 1

## 2019-05-23 MED ORDER — ALPRAZOLAM 0.5 MG PO TABS
0.5000 mg | ORAL_TABLET | Freq: Every day | ORAL | Status: DC
  Administered 2019-05-23 – 2019-06-06 (×15): 0.5 mg via ORAL
  Filled 2019-05-23 (×15): qty 1

## 2019-05-23 MED ORDER — INSULIN GLARGINE 100 UNIT/ML ~~LOC~~ SOLN
8.0000 [IU] | Freq: Every day | SUBCUTANEOUS | Status: DC
  Administered 2019-05-23 – 2019-05-25 (×3): 8 [IU] via SUBCUTANEOUS
  Filled 2019-05-23 (×4): qty 0.08

## 2019-05-23 NOTE — Progress Notes (Signed)
Patient c/o difficulty in breathing, patient noted in acute respiratory distress O2 sat 77%%, extremely anxious, RT on department, Rapid response notified and on call [place]. PA Informed of status change with order received for IV Lasix 40 mg and administered,refer to RT,MAR and Rapid Response documentation's, Closely monitored
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
## 2019-05-23 NOTE — Evaluation (Signed)
Speech Language Pathology Assessment and Plan  Patient Details  Name: Robert [name] MRN: [id number] Date of Birth: [birth year]  SLP Diagnosis: Dysphagia;Cognitive Impairments  Rehab Potential: Excellent ELOS: 3 weeks    Today's Date: 05/23/2019 SLP Individual Time: 6734-1937 SLP Individual Time Calculation (min): 55 min   Problem List:  Patient Active Problem List   Diagnosis Date Noted  . Physical debility 05/22/2019  . Demand ischemia ([place]) 05/20/2019  . Flash pulmonary edema ([place]) 05/20/2019  . Stage II decubitus ulcer ([place]) 05/18/2019  . Aspiration pneumonia of both lower lobes due to gastric secretions ([place]) 05/15/2019  . MRSA pneumonia ([place]) 05/15/2019  . Severe sepsis with septic shock ([place]) 05/15/2019  . NSTEMI (non-ST elevated myocardial infarction) ([place]) 05/15/2019  . Staphylococcal pneumonia ([place]) 04/20/2019  . HCAP (healthcare-associated pneumonia) 04/20/2019  . Pneumonia due to COVID-19 virus 04/18/2019  . COPD (chronic obstructive pulmonary disease) with emphysema ([place]) 04/18/2019  . AKI (acute kidney injury) ([place]) 04/18/2019  . Diabetes mellitus type 2, uncontrolled, with complications ([place]) 90/24/0973  . Pressure injury of skin 04/18/2019  . Atrial fibrillation with RVR ([place]) 04/15/2019  . COVID-19 virus detected 04/12/2019  . Acute respiratory distress syndrome (ARDS) due to COVID-19 virus 04/12/2019  . Hypoxia   . Sepsis ([place])   . PAD (peripheral artery disease) ([place]) 03/27/2019  . Pulmonary edema 08/27/2018  . Acute respiratory failure with hypoxemia ([place]) 08/27/2018  . Chronic combined systolic and diastolic heart failure ([place]) 08/27/2018  . Dyslipidemia (high LDL; low HDL) 03/29/2018  . Dysphagia 01/02/2018  . Diarrhea 01/02/2018  . Family history of colon cancer 01/02/2018  . Ischemic cardiomyopathy   . Insulin dependent diabetes mellitus ([place])   . Hypertension   . History of MI (myocardial infarction)   . GERD (gastroesophageal reflux disease)    . Full dentures   . Coronary artery disease   . Anxiety   . Acute ST elevation myocardial infarction (STEMI) of inferior wall ([place]) 06/06/2017  . Varicose veins of left lower extremity with complications 53/29/9242  . Cellulitis of left leg without foot   . Right radial fracture 12/11/2016  . T3 vertebral fracture ([place]) 12/11/2016  . Fall 12/09/2016  . Thrombocytopenia ([place]) 10/09/2016  . Left leg cellulitis 10/08/2016  . Diabetes ([place]) 10/08/2016  . Inguinal hernia 05/12/2016  . Incarcerated umbilical hernia 68/34/1962  . Chronic combined systolic and diastolic CHF (congestive heart failure) ([place]) 04/25/2016  . High cholesterol   . Controlled type 2 diabetes mellitus with diabetic peripheral angiopathy without gangrene, with long-term current use of insulin ([place])   . Essential hypertension   . Non-STEMI (non-ST elevated myocardial infarction) ([place])   . CAD S/P percutaneous coronary angioplasty   . Unstable angina ([place]) 03/20/2015  . ICM-EF 35% at cath 02/01/15 02/02/2015  . DM type 2 causing vascular disease, not at goal [facility]) 05/29/2014  . Dyspnea 10/20/2013  . COPD (chronic obstructive pulmonary disease) ([place]) 09/03/2013  . Hyperlipidemia   . Old myocardial infarction 09/01/2013  . Abnormal nuclear stress test 05/17/2012  . S/P CFX PTCAI for ISR 02/01/15 05/17/2012  . PVD, chronic LLE 12/22/2011  . Rheumatoid arthritis ([place]) 12/22/2011  . Contrast media allergy 12/22/2011  . Tobacco abuse 12/21/2011   Past Medical History:  Past Medical History:  Diagnosis Date  . Anxiety   . COPD (chronic obstructive pulmonary disease) ([place])   . Coronary artery disease   . Full dentures   . GERD (gastroesophageal reflux disease)    Rolaids as needed  .  High cholesterol   . History of MI (myocardial infarction)   . Hypertension    med. dosage increased 04/2016; has been on BP med. x 10 yrs.  . Incarcerated umbilical hernia 63/8756  . Inguinal hernia 05/2016   bilateral   . Insulin  dependent diabetes mellitus ([place])   . Ischemic cardiomyopathy    a. 03/2015 EF 40-45% by LV gram.  . Left leg cellulitis 10/08/2016  . Rheumatoid arthritis(714.0)    Past Surgical History:  Past Surgical History:  Procedure Laterality Date  . CARDIAC CATHETERIZATION N/A 02/01/2015   Procedure: Left Heart Cath and Coronary Angiography;  Surgeon: Belva Crome, MD;  Location: [place] CV LAB;  Service: Cardiovascular;  Laterality: N/A;  . CARDIAC CATHETERIZATION N/A 03/22/2015   Procedure: Left Heart Cath and Coronary Angiography;  Surgeon: Leonie Man, MD;  Location: [place] CV LAB;  Service: Cardiovascular;  Laterality: N/A;  . CARDIAC CATHETERIZATION  09/05/2002; 03/09/2003; 04/10/2005;06/02/2005; 05/09/2006; 02/09/2007; 03/01/2007  . COLONOSCOPY  2008   Dr. Gala Romney: diverticulosis, hyperplastic polyp.  . CORONARY ANGIOPLASTY  11/14/2012; 02/01/2015  . CORONARY ANGIOPLASTY WITH STENT PLACEMENT  05/16/2012   "1; makes total ~ 4"  . CORONARY STENT INTERVENTION N/A 06/06/2017   Procedure: CORONARY STENT INTERVENTION;  Surgeon: Belva Crome, MD;  Location: [place] CV LAB;  Service: Cardiovascular;  Laterality: N/A;  . INSERTION OF MESH Bilateral 05/24/2016   Procedure: INSERTION OF MESH;  Surgeon: Coralie Keens, MD;  Location: [place];  Service: General;  Laterality: Bilateral;  . LAPAROSCOPIC CHOLECYSTECTOMY    . LAPAROSCOPIC INGUINAL HERNIA WITH UMBILICAL HERNIA Bilateral 05/24/2016   Procedure: BILATERAL LAPAROSCOPIC INGUINAL HERNIA REPAIR WITH MESH AND UMBILICAL HERNIA REPAIR WITH MESH;  Surgeon: Coralie Keens, MD;  Location: [place];  Service: General;  Laterality: Bilateral;  . LEFT HEART CATH AND CORONARY ANGIOGRAPHY N/A 06/06/2017   Procedure: LEFT HEART CATH AND CORONARY ANGIOGRAPHY;  Surgeon: Belva Crome, MD;  Location: [place] CV LAB;  Service: Cardiovascular;  Laterality: N/A;  . LEFT HEART CATHETERIZATION WITH CORONARY ANGIOGRAM N/A  12/22/2011   Procedure: LEFT HEART CATHETERIZATION WITH CORONARY ANGIOGRAM;  Surgeon: Troy Sine, MD;  Location: [facility] CATH LAB;  Service: Cardiovascular;  Laterality: N/A;  . LEFT HEART CATHETERIZATION WITH CORONARY ANGIOGRAM Bilateral 05/16/2012   Procedure: LEFT HEART CATHETERIZATION WITH CORONARY ANGIOGRAM;  Surgeon: Lorretta Harp, MD;  Location: [facility] CATH LAB;  Service: Cardiovascular;  Laterality: Bilateral;  . LEFT HEART CATHETERIZATION WITH CORONARY ANGIOGRAM N/A 11/14/2012   Procedure: LEFT HEART CATHETERIZATION WITH CORONARY ANGIOGRAM;  Surgeon: Troy Sine, MD;  Location: [facility] CATH LAB;  Service: Cardiovascular;  Laterality: N/A;  . LEFT HEART CATHETERIZATION WITH CORONARY ANGIOGRAM N/A 09/01/2013   Procedure: LEFT HEART CATHETERIZATION WITH CORONARY ANGIOGRAM;  Surgeon: Leonie Man, MD;  Location: [facility] CATH LAB;  Service: Cardiovascular;  Laterality: N/A;  . [place]; 1973; 1985   x 3  . NM MYOCAR PERF WALL MOTION  08/30/2009   No significant ischemia  . OPEN REDUCTION INTERNAL FIXATION (ORIF) DISTAL RADIAL FRACTURE Right 12/10/2016   Procedure: OPEN REDUCTION INTERNAL FIXATION (ORIF) DISTAL RADIAL FRACTURE;  Surgeon: Iran Planas, MD;  Location: [place];  Service: Orthopedics;  Laterality: Right;  . PERCUTANEOUS CORONARY INTERVENTION-BALLOON ONLY  12/22/2011   Procedure: PERCUTANEOUS CORONARY INTERVENTION-BALLOON ONLY;  Surgeon: Troy Sine, MD;  Location: [facility] CATH LAB;  Service: Cardiovascular;;  . PERCUTANEOUS CORONARY INTERVENTION-BALLOON ONLY  11/14/2012   Procedure: PERCUTANEOUS  CORONARY INTERVENTION-BALLOON ONLY;  Surgeon: Troy Sine, MD;  Location: [facility] CATH LAB;  Service: Cardiovascular;;  . PERCUTANEOUS CORONARY STENT INTERVENTION (PCI-S)  05/16/2012   Procedure: PERCUTANEOUS CORONARY STENT INTERVENTION (PCI-S);  Surgeon: Lorretta Harp, MD;  Location: [facility] CATH LAB;  Service: Cardiovascular;;  . PERCUTANEOUS CORONARY STENT INTERVENTION (PCI-S)  09/01/2013    Procedure: PERCUTANEOUS CORONARY STENT INTERVENTION (PCI-S);  Surgeon: Leonie Man, MD;  Location: [facility] CATH LAB;  Service: Cardiovascular;;    Assessment / Plan / Recommendation Clinical Impression Patient is a 68 year old male with history of CAD with ICM, PAD, SCI with LLE weakness, T2DM, RA- on Methotrexate/actemra, anxiety disorder, COPD who was originally admitted on 04/12/19 with severe SOB, fever and cough. He was intubated in ED and found to be positive for COVID 19 and was Remdesivir and Actemra as well as steroids and broad spectrum antibiotics for MRSA PNA. [name] Hospital course significant for COPD exacerbation, septic shock due to MRSA PNA, AKI with electrolyte abnormality, acute urinary retention with hematuria--foley in place, as well as development of sacral decub. He tolerated extubation by 05/03/19 but had worsening of hypoxia with increase WOB, A fib with RVR and found to have rise in trop -1,269 due to demand ischemia from recurrent aspiration as well as CHF. He was started on IV heparin, treated with Unasyn X 5 days as well as IV lasix due to acute on chronic CHF. He continued to require HF oxygen and CIR consulted due to patient's debilitated stage and has been following for medical stability. On 05/19/19, he developed hypoxemic respiratory failure requiring re-intubation and A fib with RVR. He was transferred to [facility] 09/08 due to concerns of need for cardiac intervention. He was treated with IV BB as well as IV diuresis with improvement and tolerated extubation later that day. As troponin trending down without signs of ACS, Cardiology recommended that CAD to be treated medically and he was transitioned to Eliquis 9/9. Beside swallow done and patient started on dysphagia 2, nectar liquids. Respiratory status stable and he was cleared to start CIR and patient admitted 05/22/19.  Patient administered the Cognistat and demonstrates mild deficits in short-term recall and complex problem  solving with patient's overall attention and awareness [facility] for tasks assessed. Patient demonstrates a hoarse vocal quality but is 100% intelligible at the sentence level. Upon arrival to the session, patient was consuming his breakfast meal of Dys. 2 textures with nectar-thick liquids. Patient consumed large, sequential sips of nectar-thick liquids without overt s/s of aspiration but demonstrated mild throat clearing with solid textures, suspect due to impulsivity. Recommend patient continue current diet with full supervision to maximize safety with PO intake. Patient would benefit from skilled SLP intervention to maximize his swallowing and cognitive functioning prior to discharge.    Skilled Therapeutic Interventions          Administered a cognitive-linguistic evaluation and BSE, please see above for details. Educated patient in regards to current cognitive and swallowing deficits and goals of skilled SLP intervention. He verbalized understanding and agreement.    SLP Assessment  Patient will need skilled Speech Lanaguage Pathology Services during CIR admission    Recommendations  SLP Diet Recommendations: Nectar;Dysphagia 2 (Fine chop) Liquid Administration via: Cup Medication Administration: Whole meds with puree Supervision: Patient able to self feed;Full supervision/cueing for compensatory strategies Compensations: Slow rate;Small sips/bites;Follow solids with liquid;Minimize environmental distractions Oral Care Recommendations: Oral care BID Recommendations for Other Services: Neuropsych consult Patient destination: Home Follow up Recommendations:  Outpatient SLP;24 hour supervision/assistance Equipment Recommended: None recommended by SLP    SLP Frequency 3 to 5 out of 7 days   SLP Duration  SLP Intensity  SLP Treatment/Interventions 3 weeks  Minumum of 1-2 x/day, 30 to 90 minutes  Cognitive remediation/compensation;Internal/external aids;Cueing hierarchy;Environmental  controls;Patient/family education;Functional tasks;Dysphagia/aspiration precaution training    Pain Pain Assessment Pain Score: 0-No pain  Prior Functioning Type of Home: House  Lives With: Son Available Help at Discharge: Family Vocation: Retired  Industrial/product designer Term Goals: Week 1: SLP Short Term Goal 1 (Week 1): Patient will demonstrate functional problem solving for complex tasks with Mod I. SLP Short Term Goal 2 (Week 1): Patient will recall new, daily information with use of external aids with Mod I. SLP Short Term Goal 3 (Week 1): Patient will consume current diet with minimal overt s/s of aspiration with overall supervision level verbal cues for use of swallowing compensatory strategies. SLP Short Term Goal 4 (Week 1): Patient will demonstrate efficient mastication with complete oral clearance of regular textures without overt s/s of aspiration with supervision level verbal cues over 2 sessions prior to upgrade. SLP Short Term Goal 5 (Week 1): Patient will consume trials of thin liquids with minimal overt s/s of aspiration over 2 sessions to assess readiness for repeat MBS.  Refer to Care Plan for Long Term Goals  Recommendations for other services: Neuropsych  Discharge Criteria: Patient will be discharged from SLP if patient refuses treatment 3 consecutive times without medical reason, if treatment goals not met, if there is a change in medical status, if patient makes no progress towards goals or if patient is discharged from hospital.  The above assessment, treatment plan, treatment alternatives and goals were discussed and mutually agreed upon: by patient  Adden Strout 05/23/2019, 3:22 PM

## 2019-05-23 NOTE — Progress Notes (Signed)
Patient with respiratory distress due to flash pulmonary edema. Will repeat CXR in am and change lasix to IV daily. Will increase K dur to 20 meq tid for now and monitor weights daily.

## 2019-05-23 NOTE — IPOC Note (Signed)
Overall Plan of Care [facility]) Patient Details Name: Robert [name] MRN: [id number] DOB: December 31, 1950  Admitting Diagnosis: Physical debility  Hospital Problems: Principal Problem:   Physical debility     Functional Problem List: Nursing (P) Behavior, Bladder, Bowel, Endurance, Medication Management, Nutrition(Bladder Bowel Medication Management, Pain,Enduance, Nutriti )  PT Balance, Safety, Sensory, Endurance, Motor  OT Balance, Cognition, Endurance, Motor, Safety, Sensory, Skin Integrity  SLP Cognition  TR         Basic ADL's: OT Grooming, Bathing, Dressing, Toileting     Advanced  ADL's: OT       Transfers: PT Bed Mobility, Bed to Chair, Car, Manufacturing systems engineer, Metallurgist: PT Stairs, Emergency planning/management officer, Ambulation     Additional Impairments: OT    SLP Swallowing, Social Cognition   Memory, Problem Solving  TR      Anticipated Outcomes Item Anticipated Outcome  Self Feeding S  Swallowing  Supervision   Basic self-care  S/u UB; CGA LB  Toileting  CGA   Bathroom Transfers CGA  Bowel/Bladder  (P) Remain Continent of bladder after Foley is Discontiuent  Transfers  CGA with LRAD  Locomotion  CGA with LRAD  Communication     Cognition  Mod I  Pain  (P) less than 3  Safety/Judgment  (P) Free of fall and injuries while on Rehab   Therapy Plan: PT Intensity: Minimum of 1-2 x/day ,45 to 90 minutes PT Frequency: 5 out of 7 days(pt may need to be downgraded to 15/7 soon if he cannot tolerate full OT session later today) PT Duration Estimated Length of Stay: 3 weeks OT Intensity: Minimum of 1-2 x/day, 45 to 90 minutes OT Frequency: Total of 15 hours over 7 days of combined therapies OT Duration/Estimated Length of Stay: 3-3.5 weeks SLP Intensity: Minumum of 1-2 x/day, 30 to 90 minutes SLP Frequency: 3 to 5 out of 7 days SLP Duration/Estimated Length of Stay: 3 weeks   Due to the current state of emergency, patients may not be receiving  their 3-hours of Medicare-mandated therapy.   Team Interventions: Nursing Interventions (P) Patient/Family Education, Bladder Management, Bowel Management, Pain Management, Medication Management, Discharge Planning  PT interventions Ambulation/gait training, Community reintegration, DME/adaptive equipment instruction, Neuromuscular re-education, Psychosocial support, Stair training, UE/LE Strength taining/ROM, Wheelchair propulsion/positioning, UE/LE Coordination activities, Therapeutic Activities, Pain management, Skin care/wound management, Functional electrical stimulation, Discharge planning, Balance/vestibular training, Cognitive remediation/compensation, Disease management/prevention, Functional mobility training, Patient/family education, Splinting/orthotics, Therapeutic Exercise, Visual/perceptual remediation/compensation  OT Interventions UE/LE Strength taining/ROM, Therapeutic Activities, Skin care/wound managment, Pain management, Functional mobility training, DME/adaptive equipment instruction, Discharge planning, Training and development officer, Community reintegration, Disease mangement/prevention, Self Care/advanced ADL retraining, Therapeutic Exercise, UE/LE Coordination activities, Wheelchair propulsion/positioning, Patient/family education, Cognitive remediation/compensation, Neuromuscular re-education, Psychosocial support  SLP Interventions Cognitive remediation/compensation, Internal/external aids, English as a second language teacher, Environmental controls, Patient/family education, Functional tasks, Dysphagia/aspiration precaution training  TR Interventions    SW/CM Interventions Discharge Planning, Psychosocial Support, Patient/Family Education   Barriers to Discharge MD  Medical stability  Nursing      PT Home environment access/layout, New oxygen stairs to enter home  OT Decreased caregiver support, Lack of/limited family support, Incontinence, New oxygen    SLP      SW       Team Discharge  Planning: Destination: PT-Home ,OT- Home , SLP-Home Projected Follow-up: PT-24 hour supervision/assistance, Home health PT, OT-  Home health OT, SLP-Outpatient SLP, 24 hour supervision/assistance Projected Equipment Needs: PT-To be determined, OT- Tub/shower bench, 3 in 1 bedside  comode, SLP-None recommended by SLP Equipment Details: PT- , OT-  Patient/family involved in discharge planning: PT- Patient,  OT-Patient, SLP-Patient  MD ELOS: 21-24 days Medical Rehab Prognosis:  Excellent Assessment: The patient has been admitted for CIR therapies with the diagnosis of debility. The team will be addressing functional mobility, strength, stamina, balance, safety, adaptive techniques and equipment, self-care, bowel and bladder mgt, patient and caregiver education, wound care, cognition, swallowing. Goals have been set at supervision/CGA with self-care and mobility and supervision to mod I with cognition and swallowing.   Due to the current state of emergency, patients may not be receiving their 3 hours per day of Medicare-mandated therapy.    Meredith Staggers, MD, FAAPMR      See Team Conference Notes for weekly updates to the plan of care

## 2019-05-23 NOTE — Care Management (Signed)
[place] Individual Statement of Services  Patient Name:  Robert [name]  Date:  05/23/2019  Welcome to the [facility].  Our goal is to provide you with an individualized program based on your diagnosis and situation, designed to meet your specific needs.  With this comprehensive rehabilitation program, you will be expected to participate in at least 3 hours of rehabilitation therapies Monday-Friday, with modified therapy programming on the weekends.  Your rehabilitation program will include the following services:  Physical Therapy (PT), Occupational Therapy (OT), Speech Therapy (ST), 24 hour per day rehabilitation nursing, Therapeutic Recreaction (TR), Neuropsychology, Case Management (Social Worker), Rehabilitation Medicine, Nutrition Services and Pharmacy Services  Weekly team conferences will be held on Tuesdays to discuss your progress.  Your Social Worker will talk with you frequently to get your input and to update you on team discussions.  Team conferences with you and your family in attendance may also be held.  Expected length of stay: 3 weeks   Overall anticipated outcome: light physical assistance  Depending on your progress and recovery, your program may change. Your Social Worker will coordinate services and will keep you informed of any changes. Your Social Worker's name and contact numbers are listed  below.  The following services may also be recommended but are not provided by the [place] will be made to provide these services after discharge if needed.  Arrangements include referral to agencies that provide these services.  Your insurance has been verified to be:  [employer] Your primary doctor is:  [place]  Pertinent information will be shared with your doctor and your insurance  company.  Social Worker:  [place], [place] or [phone]   Information discussed with and copy given to patient by: Lennart Pall, 05/23/2019, 2:25 PM

## 2019-05-23 NOTE — Plan of Care (Signed)
Wound care completed.

## 2019-05-23 NOTE — Evaluation (Signed)
Occupational Therapy Assessment and Plan  Patient Details  Name: Robert [name] MRN: [id number] Date of Birth: 1951-01-04  OT Diagnosis: abnormal posture and muscle weakness (generalized) Rehab Potential:   ELOS: 3-3.5 weeks   Today's Date: 05/23/2019 OT Individual Time: 1415-1530 OT Individual Time Calculation (min): 75 min     Problem List:  Patient Active Problem List   Diagnosis Date Noted  . Physical debility 05/22/2019  . Demand ischemia ([place]) 05/20/2019  . Flash pulmonary edema ([place]) 05/20/2019  . Stage II decubitus ulcer ([place]) 05/18/2019  . Aspiration pneumonia of both lower lobes due to gastric secretions ([place]) 05/15/2019  . MRSA pneumonia ([place]) 05/15/2019  . Severe sepsis with septic shock ([place]) 05/15/2019  . NSTEMI (non-ST elevated myocardial infarction) ([place]) 05/15/2019  . Staphylococcal pneumonia ([place]) 04/20/2019  . HCAP (healthcare-associated pneumonia) 04/20/2019  . Pneumonia due to COVID-19 virus 04/18/2019  . COPD (chronic obstructive pulmonary disease) with emphysema ([place]) 04/18/2019  . AKI (acute kidney injury) ([place]) 04/18/2019  . Diabetes mellitus type 2, uncontrolled, with complications ([place]) 93/79/0240  . Pressure injury of skin 04/18/2019  . Atrial fibrillation with RVR ([place]) 04/15/2019  . COVID-19 virus detected 04/12/2019  . Acute respiratory distress syndrome (ARDS) due to COVID-19 virus 04/12/2019  . Hypoxia   . Sepsis ([place])   . PAD (peripheral artery disease) ([place]) 03/27/2019  . Pulmonary edema 08/27/2018  . Acute respiratory failure with hypoxemia ([place]) 08/27/2018  . Chronic combined systolic and diastolic heart failure ([place]) 08/27/2018  . Dyslipidemia (high LDL; low HDL) 03/29/2018  . Dysphagia 01/02/2018  . Diarrhea 01/02/2018  . Family history of colon cancer 01/02/2018  . Ischemic cardiomyopathy   . Insulin dependent diabetes mellitus ([place])   . Hypertension   . History of MI (myocardial infarction)   . GERD (gastroesophageal reflux  disease)   . Full dentures   . Coronary artery disease   . Anxiety   . Acute ST elevation myocardial infarction (STEMI) of inferior wall ([place]) 06/06/2017  . Varicose veins of left lower extremity with complications 97/35/3299  . Cellulitis of left leg without foot   . Right radial fracture 12/11/2016  . T3 vertebral fracture ([place]) 12/11/2016  . Fall 12/09/2016  . Thrombocytopenia ([place]) 10/09/2016  . Left leg cellulitis 10/08/2016  . Diabetes ([place]) 10/08/2016  . Inguinal hernia 05/12/2016  . Incarcerated umbilical hernia 24/26/8341  . Chronic combined systolic and diastolic CHF (congestive heart failure) ([place]) 04/25/2016  . High cholesterol   . Controlled type 2 diabetes mellitus with diabetic peripheral angiopathy without gangrene, with long-term current use of insulin ([place])   . Essential hypertension   . Non-STEMI (non-ST elevated myocardial infarction) ([place])   . CAD S/P percutaneous coronary angioplasty   . Unstable angina ([place]) 03/20/2015  . ICM-EF 35% at cath 02/01/15 02/02/2015  . DM type 2 causing vascular disease, not at goal [facility]) 05/29/2014  . Dyspnea 10/20/2013  . COPD (chronic obstructive pulmonary disease) ([place]) 09/03/2013  . Hyperlipidemia   . Old myocardial infarction 09/01/2013  . Abnormal nuclear stress test 05/17/2012  . S/P CFX PTCAI for ISR 02/01/15 05/17/2012  . PVD, chronic LLE 12/22/2011  . Rheumatoid arthritis ([place]) 12/22/2011  . Contrast media allergy 12/22/2011  . Tobacco abuse 12/21/2011    Past Medical History:  Past Medical History:  Diagnosis Date  . Anxiety   . COPD (chronic obstructive pulmonary disease) ([place])   . Coronary artery disease   . Full dentures   . GERD (gastroesophageal reflux disease)  Rolaids as needed  . High cholesterol   . History of MI (myocardial infarction)   . Hypertension    med. dosage increased 04/2016; has been on BP med. x 10 yrs.  . Incarcerated umbilical hernia 72/5366  . Inguinal hernia 05/2016   bilateral    . Insulin dependent diabetes mellitus ([place])   . Ischemic cardiomyopathy    a. 03/2015 EF 40-45% by LV gram.  . Left leg cellulitis 10/08/2016  . Rheumatoid arthritis(714.0)    Past Surgical History:  Past Surgical History:  Procedure Laterality Date  . CARDIAC CATHETERIZATION N/A 02/01/2015   Procedure: Left Heart Cath and Coronary Angiography;  Surgeon: Belva Crome, MD;  Location: [place] [facility];  Service: Cardiovascular;  Laterality: N/A;  . CARDIAC CATHETERIZATION N/A 03/22/2015   Procedure: Left Heart Cath and Coronary Angiography;  Surgeon: Leonie Man, MD;  Location: [place] CV [facility];  Service: Cardiovascular;  Laterality: N/A;  . CARDIAC CATHETERIZATION  09/05/2002; 03/09/2003; 04/10/2005;06/02/2005; 05/09/2006; 02/09/2007; 03/01/2007  . COLONOSCOPY  2008   Dr. Gala Romney: diverticulosis, hyperplastic polyp.  . CORONARY ANGIOPLASTY  11/14/2012; 02/01/2015  . CORONARY ANGIOPLASTY WITH STENT PLACEMENT  05/16/2012   "1; makes total ~ 4"  . CORONARY STENT INTERVENTION N/A 06/06/2017   Procedure: CORONARY STENT INTERVENTION;  Surgeon: Belva Crome, MD;  Location: [place] CV [facility];  Service: Cardiovascular;  Laterality: N/A;  . INSERTION OF MESH Bilateral 05/24/2016   Procedure: INSERTION OF MESH;  Surgeon: Coralie Keens, MD;  Location: [place];  Service: General;  Laterality: Bilateral;  . LAPAROSCOPIC CHOLECYSTECTOMY    . LAPAROSCOPIC INGUINAL HERNIA WITH UMBILICAL HERNIA Bilateral 05/24/2016   Procedure: BILATERAL LAPAROSCOPIC INGUINAL HERNIA REPAIR WITH MESH AND UMBILICAL HERNIA REPAIR WITH MESH;  Surgeon: Coralie Keens, MD;  Location: [place];  Service: General;  Laterality: Bilateral;  . LEFT HEART CATH AND CORONARY ANGIOGRAPHY N/A 06/06/2017   Procedure: LEFT HEART CATH AND CORONARY ANGIOGRAPHY;  Surgeon: Belva Crome, MD;  Location: [place] [facility];  Service: Cardiovascular;  Laterality: N/A;  . LEFT HEART CATHETERIZATION WITH CORONARY  ANGIOGRAM N/A 12/22/2011   Procedure: LEFT HEART CATHETERIZATION WITH CORONARY ANGIOGRAM;  Surgeon: Troy Sine, MD;  Location: [facility] CATH LAB;  Service: Cardiovascular;  Laterality: N/A;  . LEFT HEART CATHETERIZATION WITH CORONARY ANGIOGRAM Bilateral 05/16/2012   Procedure: LEFT HEART CATHETERIZATION WITH CORONARY ANGIOGRAM;  Surgeon: Lorretta Harp, MD;  Location: [facility] CATH LAB;  Service: Cardiovascular;  Laterality: Bilateral;  . LEFT HEART CATHETERIZATION WITH CORONARY ANGIOGRAM N/A 11/14/2012   Procedure: LEFT HEART CATHETERIZATION WITH CORONARY ANGIOGRAM;  Surgeon: Troy Sine, MD;  Location: [facility] CATH LAB;  Service: Cardiovascular;  Laterality: N/A;  . LEFT HEART CATHETERIZATION WITH CORONARY ANGIOGRAM N/A 09/01/2013   Procedure: LEFT HEART CATHETERIZATION WITH CORONARY ANGIOGRAM;  Surgeon: Leonie Man, MD;  Location: [facility] CATH LAB;  Service: Cardiovascular;  Laterality: N/A;  . Bode; 1973; 1985   x 3  . NM MYOCAR PERF WALL MOTION  08/30/2009   No significant ischemia  . OPEN REDUCTION INTERNAL FIXATION (ORIF) DISTAL RADIAL FRACTURE Right 12/10/2016   Procedure: OPEN REDUCTION INTERNAL FIXATION (ORIF) DISTAL RADIAL FRACTURE;  Surgeon: Iran Planas, MD;  Location: [place];  Service: Orthopedics;  Laterality: Right;  . PERCUTANEOUS CORONARY INTERVENTION-BALLOON ONLY  12/22/2011   Procedure: PERCUTANEOUS CORONARY INTERVENTION-BALLOON ONLY;  Surgeon: Troy Sine, MD;  Location: [facility] CATH LAB;  Service: Cardiovascular;;  . PERCUTANEOUS CORONARY INTERVENTION-BALLOON ONLY  11/14/2012   Procedure: PERCUTANEOUS CORONARY INTERVENTION-BALLOON ONLY;  Surgeon: Troy Sine, MD;  Location: [facility] CATH LAB;  Service: Cardiovascular;;  . PERCUTANEOUS CORONARY STENT INTERVENTION (PCI-S)  05/16/2012   Procedure: PERCUTANEOUS CORONARY STENT INTERVENTION (PCI-S);  Surgeon: Lorretta Harp, MD;  Location: [facility] CATH LAB;  Service: Cardiovascular;;  . PERCUTANEOUS CORONARY STENT INTERVENTION (PCI-S)   09/01/2013   Procedure: PERCUTANEOUS CORONARY STENT INTERVENTION (PCI-S);  Surgeon: Leonie Man, MD;  Location: [facility] CATH LAB;  Service: Cardiovascular;;    Assessment & Plan Clinical Impression:  Jaramiah Bossard is a 68 year old male with history of CAD with ICM, PAD, SCI with LLE weakness?, T2DM, RA- on Methotrexate/actemra, anxiety disorder, COPD who was originally admitted on 04/12/19 with severe SOB, fever and cough. He was intubated in ED and found to be positive for COVID 19 and was Remdesivir and Actemra as well as steroids and broad spectrum antibiotics for MRSA PNA. [name] Hospital course significant for COPD exacerbation, septic shock due to MRSA PNA, AKI with electrolyte abnormality, acute urinary retention with hematuria--foley in place, as well as development of sacral decub. He tolerated extubation by 05/03/19 but had worsening of hypoxia with increase WOB, A fib with RVR and found to have rise in trop -1,269 due to demand ischemia from recurrent aspiration as well as CHF. He was started on IV heparin, treated with Unasyn X 5 days as well as IV lasix due to acute on chronic CHF.   He continued to require HF oxygen and CIR consulted due to patient's debilitated stage and has been following for medical stability. On 05/19/19, he developed hypoxemic respiratory failure requiring re-intubation and A fib with RVR. He was transferred to [facility] 09/08 due to concerns of need for cardiac intervention. He was treated with IV BB as well as IV diuresis with improvement and tolerated extubation later that day. As troponin trending down without signs of ACS, Cardiology recommended that CAD to be treated medically and he was transitioned to Eliquis 9/9. Beside swallow done and patient started on dysphagia 2, nectar liquids. Respiratory status stable and he was cleared to start CIR.   Patient currently requires max with basic self-care skills secondary to muscle weakness, decreased cardiorespiratoy endurance,  decreased problem solving, decreased safety awareness and decreased memory and decreased sitting balance, decreased standing balance, decreased postural control and decreased balance strategies.  Prior to hospitalization, patient could complete BADL/IADL with independent .  Patient will benefit from skilled intervention to decrease level of assist with basic self-care skills and increase independence with basic self-care skills prior to discharge home with care partner.  Anticipate patient will require 24 hour supervision and minimal physical assistance and follow up home health.  OT - End of Session Activity Tolerance: Tolerates 10 - 20 min activity with multiple rests Endurance Deficit: Yes OT Assessment OT Barriers to Discharge: Decreased caregiver support;Lack of/limited family support;Incontinence;New oxygen OT Patient demonstrates impairments in the following area(s): Balance;Cognition;Endurance;Motor;Safety;Sensory;Skin Integrity OT Basic ADL's Functional Problem(s): Grooming;Bathing;Dressing;Toileting OT Transfers Functional Problem(s): Toilet;Tub/Shower OT Plan OT Intensity: Minimum of 1-2 x/day, 45 to 90 minutes OT Frequency: Total of 15 hours over 7 days of combined therapies OT Treatment/Interventions: UE/LE Strength taining/ROM;Therapeutic Activities;Skin care/wound managment;Pain management;Functional mobility training;DME/adaptive equipment instruction;Discharge planning;Balance/vestibular training;Community reintegration;Disease mangement/prevention;Self Care/advanced ADL retraining;Therapeutic Exercise;UE/LE Coordination activities;Wheelchair propulsion/positioning;Patient/family education;Cognitive remediation/compensation;Neuromuscular re-education;Psychosocial support OT Self Feeding Anticipated Outcome(s): S OT Basic Self-Care Anticipated Outcome(s): S/u UB; CGA LB OT Toileting Anticipated Outcome(s): CGA OT Bathroom Transfers Anticipated Outcome(s): CGA OT  Recommendation Patient  destination: Home Follow Up Recommendations: Home health OT Equipment Recommended: Tub/shower bench;3 in 1 bedside comode   Skilled Therapeutic Intervention Pt received in bed agreeable to tx. Pt educated on role/purpose of OT, CIR, ELOS and POC. Pt completes supine>sitting EOB with A for trunk elevation. Pt completes bathing and dressing EOB as stated below. Pt sit to stand with MOD A of 2 with hyperextension in BLE and elevated bed level. Tp requires VC for hand placement during transitional movement. Pt completes sit to stand in stedy with MAX fading to MOD A for power up from w/c and elevated bed. Pt declines oral care. Pt educated on use of sock aide and reacher. Pt able to return demo use to doff and don socks with MOD VC. Exited session with pt seated in bed, exit alarm on and call light in reach  OT Evaluation Precautions/Restrictions  Precautions Precautions: Fall Precaution Comments: sacral wound, supplemental oxygen Required Braces or Orthoses: Other Brace Other Brace: B Prevalon boots Restrictions Weight Bearing Restrictions: No General Chart Reviewed: Yes Family/Caregiver Present: No Vital Signs Therapy Vitals Temp: 98.6 F (37 C) Temp Source: Oral Pulse Rate: 70 Resp: 17 BP: 119/75 Patient Position (if appropriate): Lying Oxygen Therapy SpO2: 97 % O2 Device: Nasal Cannula O2 Flow Rate (L/min): 2 L/min Pain Pain Assessment Pain Score: 0-No pain Home Living/Prior Functioning Home Living Living Arrangements: Children Available Help at Discharge: Family Type of Home: House Home Access: Stairs to enter Technical brewer of Steps: 3 Entrance Stairs-Rails: Right, Left, Can reach both Home Layout: Multi-level, Able to live on main level with bedroom/bathroom Bathroom Shower/Tub: Tub/shower unit, Architectural technologist: Handicapped height  Lives With: Son Prior Function Level of Independence: Independent with basic ADLs, Independent  with transfers, Independent with gait  Able to Take Stairs?: Yes Driving: Yes Vocation: Retired Comments: occasional use of SPC ADL ADL Grooming: Other (comment)(refused) Upper Body Bathing: Supervision/safety Where Assessed-Upper Body Bathing: Edge of bed Lower Body Bathing: Maximal assistance Where Assessed-Lower Body Bathing: Edge of bed(standing EOB) Upper Body Dressing: Minimal assistance Where Assessed-Upper Body Dressing: Edge of bed Lower Body Dressing: Maximal assistance Where Assessed-Lower Body Dressing: Edge of bed Toileting: Not assessed Toilet Transfer: Dependent(stedy) Vision Baseline Vision/History: No visual deficits Patient Visual Report: No change from baseline Vision Assessment?: No apparent visual deficits Perception  Perception: Within Functional Limits Praxis Praxis: Intact Cognition Overall Cognitive Status: Impaired/Different from baseline Arousal/Alertness: Awake/alert Orientation Level: Person;Place;Situation Person: Oriented Place: Oriented Situation: Oriented Year: 2020 Month: September Day of Week: Correct Memory: Impaired Memory Impairment: Decreased short term memory Immediate Memory Recall: Bed;Blue Memory Recall Sock: With Cue Memory Recall Blue: Without Cue Memory Recall Bed: Without Cue Awareness: Appears intact Problem Solving: Impaired Problem Solving Impairment: Functional complex Executive Function: Reasoning;Organizing;Decision Making;Initiating Reasoning: Appears intact Sequencing: Appears intact Behaviors: Poor frustration tolerance;Restless Safety/Judgment: Impaired Sensation Sensation Light Touch: Impaired by gross assessment(reports impaired LLE) Hot/Cold: Not tested Proprioception: Appears Intact Stereognosis: Not tested Coordination Gross Motor Movements are Fluid and Coordinated: No Fine Motor Movements are Fluid and Coordinated: Yes Coordination and Movement Description: generalized weakness is  L UE and  LE Finger Nose Finger Test: Pt able complete B UE WNL Motor  Motor Motor: Abnormal postural alignment and control Motor - Skilled Clinical Observations: generalized deconditioning Mobility  Bed Mobility Bed Mobility: Supine to Sit;Scooting to HOB Supine to Sit: Moderate Assistance - Patient 50-74% Scooting to HOB: Maximal Assistance - Patient 25-49% Transfers Sit to Stand: 2 Helpers Stand to Sit: 2 Helpers  Trunk/Postural Assessment  Cervical Assessment Cervical Assessment: Exceptions to WFL(head forward) Thoracic Assessment Thoracic Assessment: Exceptions to WFL(rounded shoudlers) Lumbar Assessment Lumbar Assessment: Exceptions to WFL(posterior pelvic tilt) Postural Control Postural Control: Deficits on evaluation Trunk Control: Pt decreased trunk control due to generalized weakness Righting Reactions: delayed Protective Responses: delayed  Balance Balance Balance Assessed: Yes Static Sitting Balance Static Sitting - Balance Support: Feet supported Static Sitting - Level of Assistance: 5: Stand by assistance Dynamic Sitting Balance Sitting balance - Comments: Able to scoot to tip of w/c with Min A for lateral weight shifts Static Standing Balance Static Standing - Balance Support: Bilateral upper extremity supported Static Standing - Level of Assistance: 3: Mod assist(+2 present for safety) Static Standing - Comment/# of Minutes: RW during bathing Extremity/Trunk Assessment RUE Assessment RUE Assessment: Within Functional Limits Active Range of Motion (AROM) Comments: 120 degrees AROM General Strength Comments: 4/5 MMT; generalized weakness LUE Assessment LUE Assessment: Within Functional Limits Active Range of Motion (AROM) Comments: 115 degrees AROM General Strength Comments: 4/5 MMT, generalized weakness     Refer to Care Plan for Long Term Goals  Recommendations for other services: Therapeutic Recreation  Stress management   Discharge Criteria: Patient will  be discharged from OT if patient refuses treatment 3 consecutive times without medical reason, if treatment goals not met, if there is a change in medical status, if patient makes no progress towards goals or if patient is discharged from hospital.  The above assessment, treatment plan, treatment alternatives and goals were discussed and mutually agreed upon: by patient  Tonny Branch 05/23/2019, 4:28 PM

## 2019-05-23 NOTE — Progress Notes (Signed)
Responded to spiritual care consult. Called referral (Shelly B.) for further information with Pt.  Pt was alert and being taken from wheelchair to bed. Also, daughter was at bedside. Pt was processing his emotional struggle with his past. He mentioned isolation and his close experience with death, but he also stated that he's naturally a positive person.  Daughter stated they wanted to bring other family members to visit, but was a little disgruntled about having only one family member visit per day. I encouraged her that If Beans wanted another Chaplain consult, to ask Nurse. Robert [name] stated he was going to work hard in physical therapy so that his two week stay in therapy will be shortened.  I offered spiritual care with words of encouragement, ministry of presence, empathic listening and prayer. Chaplain available as needed.  Chaplain Fidel Levy [phone]

## 2019-05-23 NOTE — Progress Notes (Signed)
Patient information reviewed and entered into eRehab System by Becky Seira Cody, PPS coordinator. Information including medical coding, function ability, and quality indicators will be reviewed and updated through discharge.   

## 2019-05-23 NOTE — Discharge Instructions (Addendum)
Information on my medicine - ELIQUIS (apixaban)  This medication education was reviewed with me or my healthcare representative as part of my discharge preparation.  Why was Eliquis prescribed for you? Eliquis was prescribed for you to reduce the risk of a blood clot forming that can cause a stroke if you have a medical condition called atrial fibrillation (a type of irregular heartbeat).  What do You need to know about Eliquis ? Take your Eliquis TWICE DAILY - one tablet in the morning and one tablet in the evening with or without food. If you have difficulty swallowing the tablet whole please discuss with your pharmacist how to take the medication safely.  Take Eliquis exactly as prescribed by your doctor and DO NOT stop taking Eliquis without talking to the doctor who prescribed the medication.  Stopping may increase your risk of developing a stroke.  Refill your prescription before you run out.  After discharge, you should have regular check-up appointments with your healthcare provider that is prescribing your Eliquis.  In the future your dose may need to be changed if your kidney function or weight changes by a significant amount or as you get older.  What do you do if you miss a dose? If you miss a dose, take it as soon as you remember on the same day and resume taking twice daily.  Do not take more than one dose of ELIQUIS at the same time to make up a missed dose.  Important Safety Information A possible side effect of Eliquis is bleeding. You should call your healthcare provider right away if you experience any of the following: ? Bleeding from an injury or your nose that does not stop. ? Unusual colored urine (red or dark brown) or unusual colored stools (red or black). ? Unusual bruising for unknown reasons. ? A serious fall or if you hit your head (even if there is no bleeding).  Some medicines may interact with Eliquis and might increase your risk of bleeding  or clotting while on Eliquis. To help avoid this, consult your healthcare provider or pharmacist prior to using any new prescription or non-prescription medications, including herbals, vitamins, non-steroidal anti-inflammatory drugs (NSAIDs) and supplements.  This website has more information on Eliquis (apixaban): http://www.eliquis.com/eliquis/home    Inpatient Rehab Discharge Instructions  Robert [name] Discharge date and time: 06/06/19   Activities/Precautions/ Functional Status: Activity: no lifting, driving, or strenuous exercise till cleared by MD Diet: cardiac diet  Wound Care:  Cleanse buttock wound with normal saline (wipe off slough) then cover with layer of santyl, then cove with damp to dry dressing. Change daily and more frequently if soiled.     Functional status:  _X__ No restrictions     ___ Walk up steps independently ___ 24/7 supervision/assistance   ___ Walk up steps with assistance ___ Intermittent supervision/assistance  ___ Bathe/dress independently ___ Walk with walker     _X__ Bathe/dress with assistance ___ Walk Independently    ___ Shower independently ___ Walk with assistance    ___ Shower with assistance _X__ No alcohol     ___ Return to work/school ________  COMMUNITY REFERRALS UPON DISCHARGE:    Home Health:   PT, OT, SP, RN    [place]  Phone:[phone]  Date of last service:06/07/2019  Medical Equipment/Items Ordered:HAS ALL NEEDED EQUIPMENT WILL GET TUB BENCH ON OWN   Special Instructions: 1. Monitor blood sugars twice a day. Follow up with PCP for adjustment of  medications. 2. Continue to cath every 4-6 hours to keep volumes less than 350 cc.    My questions have been answered and I understand these instructions. I will adhere to these goals and the provided educational materials after my discharge from the hospital.  Patient/Caregiver Signature _______________________________ Date __________  Clinician Signature  _______________________________________ Date __________  Please bring this form and your medication list with you to all your follow-up doctor's appointments.

## 2019-05-23 NOTE — Evaluation (Signed)
Physical Therapy Assessment and Plan  Patient Details  Name: Robert [name] MRN: [id number] Date of Birth: 1951/05/29  PT Diagnosis: Abnormal posture, Abnormality of gait, Ataxic gait, Coordination disorder, Difficulty walking, Impaired cognition and Muscle weakness Rehab Potential: Good ELOS: 3 weeks   Today's Date: 05/23/2019 PT Individual Time: 5093-2671 PT Individual Time Calculation (min): 47 min    Problem List:  Patient Active Problem List   Diagnosis Date Noted  . Physical debility 05/22/2019  . Demand ischemia ([place]) 05/20/2019  . Flash pulmonary edema ([place]) 05/20/2019  . Stage II decubitus ulcer ([place]) 05/18/2019  . Aspiration pneumonia of both lower lobes due to gastric secretions ([place]) 05/15/2019  . MRSA pneumonia ([place]) 05/15/2019  . Severe sepsis with septic shock ([place]) 05/15/2019  . NSTEMI (non-ST elevated myocardial infarction) ([place]) 05/15/2019  . Staphylococcal pneumonia ([place]) 04/20/2019  . HCAP (healthcare-associated pneumonia) 04/20/2019  . Pneumonia due to COVID-19 virus 04/18/2019  . COPD (chronic obstructive pulmonary disease) with emphysema ([place]) 04/18/2019  . AKI (acute kidney injury) ([place]) 04/18/2019  . Diabetes mellitus type 2, uncontrolled, with complications ([place]) 24/58/0998  . Pressure injury of skin 04/18/2019  . Atrial fibrillation with RVR ([place]) 04/15/2019  . COVID-19 virus detected 04/12/2019  . Acute respiratory distress syndrome (ARDS) due to COVID-19 virus 04/12/2019  . Hypoxia   . Sepsis ([place])   . PAD (peripheral artery disease) ([place]) 03/27/2019  . Pulmonary edema 08/27/2018  . Acute respiratory failure with hypoxemia ([place]) 08/27/2018  . Chronic combined systolic and diastolic heart failure ([place]) 08/27/2018  . Dyslipidemia (high LDL; low HDL) 03/29/2018  . Dysphagia 01/02/2018  . Diarrhea 01/02/2018  . Family history of colon cancer 01/02/2018  . Ischemic cardiomyopathy   . Insulin dependent diabetes mellitus ([place])   . Hypertension   .  History of MI (myocardial infarction)   . GERD (gastroesophageal reflux disease)   . Full dentures   . Coronary artery disease   . Anxiety   . Acute ST elevation myocardial infarction (STEMI) of inferior wall ([place]) 06/06/2017  . Varicose veins of left lower extremity with complications 33/82/5053  . Cellulitis of left leg without foot   . Right radial fracture 12/11/2016  . T3 vertebral fracture ([place]) 12/11/2016  . Fall 12/09/2016  . Thrombocytopenia ([place]) 10/09/2016  . Left leg cellulitis 10/08/2016  . Diabetes ([place]) 10/08/2016  . Inguinal hernia 05/12/2016  . Incarcerated umbilical hernia 97/67/3419  . Chronic combined systolic and diastolic CHF (congestive heart failure) ([place]) 04/25/2016  . High cholesterol   . Controlled type 2 diabetes mellitus with diabetic peripheral angiopathy without gangrene, with long-term current use of insulin ([place])   . Essential hypertension   . Non-STEMI (non-ST elevated myocardial infarction) ([place])   . CAD S/P percutaneous coronary angioplasty   . Unstable angina ([place]) 03/20/2015  . ICM-EF 35% at cath 02/01/15 02/02/2015  . DM type 2 causing vascular disease, not at goal [facility]) 05/29/2014  . Dyspnea 10/20/2013  . COPD (chronic obstructive pulmonary disease) ([place]) 09/03/2013  . Hyperlipidemia   . Old myocardial infarction 09/01/2013  . Abnormal nuclear stress test 05/17/2012  . S/P CFX PTCAI for ISR 02/01/15 05/17/2012  . PVD, chronic LLE 12/22/2011  . Rheumatoid arthritis ([place]) 12/22/2011  . Contrast media allergy 12/22/2011  . Tobacco abuse 12/21/2011    Past Medical History:  Past Medical History:  Diagnosis Date  . Anxiety   . COPD (chronic obstructive pulmonary disease) ([place])   . Coronary artery disease   . Full dentures   .  GERD (gastroesophageal reflux disease)    Rolaids as needed  . High cholesterol   . History of MI (myocardial infarction)   . Hypertension    med. dosage increased 04/2016; has been on BP med. x 10 yrs.  .  Incarcerated umbilical hernia 50/0938  . Inguinal hernia 05/2016   bilateral   . Insulin dependent diabetes mellitus ([place])   . Ischemic cardiomyopathy    a. 03/2015 EF 40-45% by LV gram.  . Left leg cellulitis 10/08/2016  . Rheumatoid arthritis(714.0)    Past Surgical History:  Past Surgical History:  Procedure Laterality Date  . CARDIAC CATHETERIZATION N/A 02/01/2015   Procedure: Left Heart Cath and Coronary Angiography;  Surgeon: Belva Crome, MD;  Location: [place] [facility];  Service: Cardiovascular;  Laterality: N/A;  . CARDIAC CATHETERIZATION N/A 03/22/2015   Procedure: Left Heart Cath and Coronary Angiography;  Surgeon: Leonie Man, MD;  Location: [place] [facility];  Service: Cardiovascular;  Laterality: N/A;  . CARDIAC CATHETERIZATION  09/05/2002; 03/09/2003; 04/10/2005;06/02/2005; 05/09/2006; 02/09/2007; 03/01/2007  . COLONOSCOPY  2008   Dr. Gala Romney: diverticulosis, hyperplastic polyp.  . CORONARY ANGIOPLASTY  11/14/2012; 02/01/2015  . CORONARY ANGIOPLASTY WITH STENT PLACEMENT  05/16/2012   "1; makes total ~ 4"  . CORONARY STENT INTERVENTION N/A 06/06/2017   Procedure: CORONARY STENT INTERVENTION;  Surgeon: Belva Crome, MD;  Location: [place] [facility];  Service: Cardiovascular;  Laterality: N/A;  . INSERTION OF MESH Bilateral 05/24/2016   Procedure: INSERTION OF MESH;  Surgeon: Coralie Keens, MD;  Location: [place];  Service: General;  Laterality: Bilateral;  . LAPAROSCOPIC CHOLECYSTECTOMY    . LAPAROSCOPIC INGUINAL HERNIA WITH UMBILICAL HERNIA Bilateral 05/24/2016   Procedure: BILATERAL LAPAROSCOPIC INGUINAL HERNIA REPAIR WITH MESH AND UMBILICAL HERNIA REPAIR WITH MESH;  Surgeon: Coralie Keens, MD;  Location: [place];  Service: General;  Laterality: Bilateral;  . LEFT HEART CATH AND CORONARY ANGIOGRAPHY N/A 06/06/2017   Procedure: LEFT HEART CATH AND CORONARY ANGIOGRAPHY;  Surgeon: Belva Crome, MD;  Location: [place] [facility];  Service:  Cardiovascular;  Laterality: N/A;  . LEFT HEART CATHETERIZATION WITH CORONARY ANGIOGRAM N/A 12/22/2011   Procedure: LEFT HEART CATHETERIZATION WITH CORONARY ANGIOGRAM;  Surgeon: Troy Sine, MD;  Location: [facility] CATH LAB;  Service: Cardiovascular;  Laterality: N/A;  . LEFT HEART CATHETERIZATION WITH CORONARY ANGIOGRAM Bilateral 05/16/2012   Procedure: LEFT HEART CATHETERIZATION WITH CORONARY ANGIOGRAM;  Surgeon: Lorretta Harp, MD;  Location: [facility] CATH LAB;  Service: Cardiovascular;  Laterality: Bilateral;  . LEFT HEART CATHETERIZATION WITH CORONARY ANGIOGRAM N/A 11/14/2012   Procedure: LEFT HEART CATHETERIZATION WITH CORONARY ANGIOGRAM;  Surgeon: Troy Sine, MD;  Location: [facility] CATH LAB;  Service: Cardiovascular;  Laterality: N/A;  . LEFT HEART CATHETERIZATION WITH CORONARY ANGIOGRAM N/A 09/01/2013   Procedure: LEFT HEART CATHETERIZATION WITH CORONARY ANGIOGRAM;  Surgeon: Leonie Man, MD;  Location: [facility] CATH LAB;  Service: Cardiovascular;  Laterality: N/A;  . [place]; 1973; 1985   x 3  . NM MYOCAR PERF WALL MOTION  08/30/2009   No significant ischemia  . OPEN REDUCTION INTERNAL FIXATION (ORIF) DISTAL RADIAL FRACTURE Right 12/10/2016   Procedure: OPEN REDUCTION INTERNAL FIXATION (ORIF) DISTAL RADIAL FRACTURE;  Surgeon: Iran Planas, MD;  Location: [place];  Service: Orthopedics;  Laterality: Right;  . PERCUTANEOUS CORONARY INTERVENTION-BALLOON ONLY  12/22/2011   Procedure: PERCUTANEOUS CORONARY INTERVENTION-BALLOON ONLY;  Surgeon: Troy Sine, MD;  Location: [facility] CATH LAB;  Service: Cardiovascular;;  .  PERCUTANEOUS CORONARY INTERVENTION-BALLOON ONLY  11/14/2012   Procedure: PERCUTANEOUS CORONARY INTERVENTION-BALLOON ONLY;  Surgeon: Troy Sine, MD;  Location: [facility] CATH LAB;  Service: Cardiovascular;;  . PERCUTANEOUS CORONARY STENT INTERVENTION (PCI-S)  05/16/2012   Procedure: PERCUTANEOUS CORONARY STENT INTERVENTION (PCI-S);  Surgeon: Lorretta Harp, MD;  Location: [facility] CATH LAB;   Service: Cardiovascular;;  . PERCUTANEOUS CORONARY STENT INTERVENTION (PCI-S)  09/01/2013   Procedure: PERCUTANEOUS CORONARY STENT INTERVENTION (PCI-S);  Surgeon: Leonie Man, MD;  Location: [facility] CATH LAB;  Service: Cardiovascular;;    Assessment & Plan Clinical Impression: Patient is a 68 y.o. year old male with history of CAD with ICM, PAD, SCI with LLE weakness?, T2DM, RA- on Methotrexate/actemra, anxiety disorder, COPD who was originally admitted on 04/12/19 with severe SOB, fever and cough. He was intubated in ED and found to be positive for COVID 19 and was Remdesivir and Actemra as well as steroids and broad spectrum antibiotics for MRSA PNA. [name] Hospital course significant for COPD exacerbation, septic shock due to MRSA PNA, AKI with electrolyte abnormality, acute urinary retention with hematuria--foley in place, as well as development of sacral decub. He tolerated extubation by 05/03/19 but had worsening of hypoxia with increase WOB, A fib with RVR and found to have rise in trop -1,269 due to demand ischemia from recurrent aspiration as well as CHF. He was started on IV heparin, treated with Unasyn X 5 days as well as IV lasix due to acute on chronic CHF.   He continued to require HF oxygen and CIR consulted due to patient's debilitated stage and has been following for medical stability. On 05/19/19, he developed hypoxemic respiratory failure requiring re-intubation and A fib with RVR. He was transferred to [facility] 09/08 due to concerns of need for cardiac intervention. He was treated with IV BB as well as IV diuresis with improvement and tolerated extubation later that day. As troponin trending down without signs of ACS, Cardiology recommended that CAD to be treated medically and he was transitioned to Eliquis 9/9. Beside swallow done and patient started on dysphagia 2, nectar liquids. Respiratory status stable and he was cleared to start CIR. [name]  Patient transferred to CIR on 05/22/2019 .    Patient currently requires mod assist +2 with mobility secondary to muscle weakness, decreased cardiorespiratoy endurance, decreased coordination, decreased memory, and decreased standing balance, decreased postural control and decreased balance strategies.  Prior to hospitalization, patient was independent  with mobility and lived with Son(pt reports son has some learning disabilites but is retired from a job and can care for himself) in a House home.  Home access is 3Stairs to enter.  Patient will benefit from skilled PT intervention to maximize safe functional mobility, minimize fall risk and decrease caregiver burden for planned discharge home with 24 hour assist.  Anticipate patient will benefit from follow up [facility] at discharge.  PT - End of Session Activity Tolerance: Tolerates 30+ min activity with multiple rests;Decreased this session Endurance Deficit: Yes Endurance Deficit Description: 2/2 generalized deconditioning PT Assessment Rehab Potential (ACUTE/IP ONLY): Good PT Barriers to Discharge: Home environment access/layout;New oxygen PT Barriers to Discharge Comments: stairs to enter home PT Patient demonstrates impairments in the following area(s): Balance;Safety;Sensory;Endurance;Motor PT Transfers Functional Problem(s): Bed Mobility;Bed to Chair;Car;Furniture PT Locomotion Functional Problem(s): Stairs;Wheelchair Mobility;Ambulation PT Plan PT Intensity: Minimum of 1-2 x/day ,45 to 90 minutes PT Frequency: 5 out of 7 days(pt may need to be downgraded to 15/7 soon if he cannot tolerate full OT session later  today) PT Duration Estimated Length of Stay: 3 weeks PT Treatment/Interventions: Ambulation/gait training;Community reintegration;DME/adaptive equipment instruction;Neuromuscular re-education;Psychosocial support;Stair training;UE/LE Strength taining/ROM;Wheelchair propulsion/positioning;UE/LE Coordination activities;Therapeutic Activities;Pain management;Skin care/wound  management;Functional electrical stimulation;Discharge planning;Balance/vestibular training;Cognitive remediation/compensation;Disease management/prevention;Functional mobility training;Patient/family education;Splinting/orthotics;Therapeutic Exercise;Visual/perceptual remediation/compensation PT Transfers Anticipated Outcome(s): CGA with LRAD PT Locomotion Anticipated Outcome(s): CGA with LRAD PT Recommendation Recommendations for Other Services: Neuropsych consult;Therapeutic Recreation consult Therapeutic Recreation Interventions: Stress management Follow Up Recommendations: 24 hour supervision/assistance;Home health PT Patient destination: Home Equipment Recommended: To be determined  Skilled Therapeutic Intervention Patient received asleep in bed but awakened & agreeable to tx. Pt requires mod assist & hospital bed features for supine>sit and mod assist +2 for sit<>stand from EOB. Pt with B knee hyperextension & leaning back onto bed with pt reporting BLE weakness & fearful of standing/falling & unwilling to attempt stand pivot to chair. Pt transferred sit<>stand into stedy with mod assist +2 with improving ability to shift pelvis anteriorly & pt transported to recliner. Pt performed BLE long arc quads, hip flexion, and hip adduction squeezes, and BUE chest press with 1# weighted bar for strengthening. Provided pt with w/c but pt declines getting into it today. Pt reporting fatigue throughout session & missed time 2/2 fatigue. Pt left in recliner with chair alarm donned & call bell in lap.   Pt on 2L/min via nasal cannula during session & briefly removed (by OT specialist Jenn) while pt sat EOB, SpO2 was 88% so reapplied nasal cannula and SpO2 remained >90% with 2L/min.  PT Evaluation Precautions/Restrictions Precautions Precautions: Fall Precaution Comments: sacral wound, supplemental oxygen Required Braces or Orthoses: Other Brace Other Brace: B Prevalon boots Restrictions Weight  Bearing Restrictions: No  General Chart Reviewed: Yes Additional Pertinent History: CAD, PAD, SCI with LLE weakness, DM2, RA, anxiety, COPD, high cholesterol, hx of MI, HTN PT Amount of Missed Time (min): 13 Minutes PT Missed Treatment Reason: Patient fatigue Response to Previous Treatment: Patient with no complaints from previous session. Family/Caregiver Present: No  Pain Pt denied c/o pain.   Home Living/Prior Functioning Home Living Available Help at Discharge: Family(reports son & daughter will assist at d/c) Type of Home: House Home Access: Stairs to enter [employer] of Steps: 3 Entrance Stairs-Rails: Right;Left;Can reach both Home Layout: Multi-level;Able to live on main level with bedroom/bathroom  Lives With: Son(pt reports son has some learning disabilites but is retired from a job and can care for himself) Prior Function Level of Independence: Independent with basic ADLs;Independent with transfers;Independent with gait  Able to Take Stairs?: Yes Driving: Yes Vocation: Retired  Associate Professor Overall Cognitive Status: Difficult to assess Arousal/Alertness: Awake/alert Orientation Level: Oriented to person;Oriented to time;Oriented to situation(pt [place] but then is able to state he is in this situation 2/2 COVID 19) Memory: Impaired(pt initially reports he was married to wife for a couple days then states 70 years and they had a good marriage) Memory Impairment: Decreased long term memory Safety/Judgment: Other (comment)(to be assessed further, pt with good awareness, asking therapists to use gait belt, prefers to have 2 therapists assist vs 1, is aware of BLE buckling in standing)  Sensation Sensation Light Touch: Not tested Proprioception: Not tested Coordination Gross Motor Movements are Fluid and Coordinated: No Fine Motor Movements are Fluid and Coordinated: Yes Coordination and Movement Description: 2/2 generalized  weakness  Motor  Motor Motor: Abnormal postural alignment and control Motor - Skilled Clinical Observations: generalized deconditioning   Mobility Bed Mobility Bed Mobility: Supine to Sit Supine to  Sit: Moderate Assistance - Patient 50-74%(hospital bed features) Transfers Transfers: Sit to Stand;Stand to Sit Sit to Stand: Moderate Assistance - Patient 50-74%;2 Helpers(cuing for hand placement) Stand to Sit: Moderate Assistance - Patient 50-74%;2 Helpers Transfer via Lift Equipment: Stedy(pt transfers bed>recliner with use of stedy)  Locomotion  Gait Ambulation: No Gait Gait: No Stairs / Additional Locomotion Stairs: No Wheelchair Mobility Wheelchair Mobility: No   Trunk/Postural Assessment  Thoracic Assessment Thoracic Assessment: Exceptions to WFL(rounded shoulders) Lumbar Assessment Lumbar Assessment: Exceptions to WFL(posterior pelvic tilt) Postural Control Postural Control: Deficits on evaluation Righting Reactions: delayed Protective Responses: delayed   Balance Balance Balance Assessed: Yes Static Sitting Balance Static Sitting - Balance Support: Feet supported;Bilateral upper extremity supported Static Sitting - Level of Assistance: 5: Stand by assistance Static Standing Balance Static Standing - Balance Support: Bilateral upper extremity supported Static Standing - Level of Assistance: 3: Mod assist(2 helpers)  Extremity Assessment  Extremities not formally assessed. BLE grossly 3/5 as pt able to perform BLE long arc quads through full ROM, but pt requires significant support for static standing with pt reporting BLE knee buckling.    Refer to Care Plan for Long Term Goals  Recommendations for other services: Neuropsych and Therapeutic Recreation  Stress management  Discharge Criteria: Patient will be discharged from PT if patient refuses treatment 3 consecutive times without medical reason, if treatment goals not met, if there is a change in medical  status, if patient makes no progress towards goals or if patient is discharged from hospital.  The above assessment, treatment plan, treatment alternatives and goals were discussed and mutually agreed upon: by patient  Waunita Schooner 05/23/2019, 11:44 AM

## 2019-05-23 NOTE — Progress Notes (Signed)
Social Work Assessment and Plan   Patient Details  Name: Robert [name] MRN: [id number] Date of Birth: Jan 14, 1951  Today's Date: 05/23/2019  Problem List:  Patient Active Problem List   Diagnosis Date Noted  . Physical debility 05/22/2019  . Demand ischemia ([place]) 05/20/2019  . Flash pulmonary edema ([place]) 05/20/2019  . Stage II decubitus ulcer ([place]) 05/18/2019  . Aspiration pneumonia of both lower lobes due to gastric secretions ([place]) 05/15/2019  . MRSA pneumonia ([place]) 05/15/2019  . Severe sepsis with septic shock ([place]) 05/15/2019  . NSTEMI (non-ST elevated myocardial infarction) ([place]) 05/15/2019  . Staphylococcal pneumonia ([place]) 04/20/2019  . HCAP (healthcare-associated pneumonia) 04/20/2019  . Pneumonia due to COVID-19 virus 04/18/2019  . COPD (chronic obstructive pulmonary disease) with emphysema ([place]) 04/18/2019  . AKI (acute kidney injury) ([place]) 04/18/2019  . Diabetes mellitus type 2, uncontrolled, with complications ([place]) [id number]  . Pressure injury of skin 04/18/2019  . Atrial fibrillation with RVR ([place]) 04/15/2019  . COVID-19 virus detected 04/12/2019  . Acute respiratory distress syndrome (ARDS) due to COVID-19 virus 04/12/2019  . Hypoxia   . Sepsis ([place])   . PAD (peripheral artery disease) ([place]) 03/27/2019  . Pulmonary edema 08/27/2018  . Acute respiratory failure with hypoxemia ([place]) 08/27/2018  . Chronic combined systolic and diastolic heart failure ([place]) 08/27/2018  . Dyslipidemia (high LDL; low HDL) 03/29/2018  . Dysphagia 01/02/2018  . Diarrhea 01/02/2018  . Family history of colon cancer 01/02/2018  . Ischemic cardiomyopathy   . Insulin dependent diabetes mellitus ([place])   . Hypertension   . History of MI (myocardial infarction)   . GERD (gastroesophageal reflux disease)   . Full dentures   . Coronary artery disease   . Anxiety   . Acute ST elevation myocardial infarction (STEMI) of inferior wall ([place]) 06/06/2017  . Varicose veins of left lower extremity  with complications [id number]  . Cellulitis of left leg without foot   . Right radial fracture 12/11/2016  . T3 vertebral fracture ([place]) 12/11/2016  . Fall 12/09/2016  . Thrombocytopenia ([place]) 10/09/2016  . Left leg cellulitis 10/08/2016  . Diabetes ([place]) 10/08/2016  . Inguinal hernia 05/12/2016  . Incarcerated umbilical hernia [id number]  . Chronic combined systolic and diastolic CHF (congestive heart failure) ([place]) 04/25/2016  . High cholesterol   . Controlled type 2 diabetes mellitus with diabetic peripheral angiopathy without gangrene, with long-term current use of insulin ([place])   . Essential hypertension   . Non-STEMI (non-ST elevated myocardial infarction) ([place])   . CAD S/P percutaneous coronary angioplasty   . Unstable angina ([place]) 03/20/2015  . ICM-EF 35% at cath 02/01/15 02/02/2015  . DM type 2 causing vascular disease, not at goal [facility]) 05/29/2014  . Dyspnea 10/20/2013  . COPD (chronic obstructive pulmonary disease) ([place]) 09/03/2013  . Hyperlipidemia   . Old myocardial infarction 09/01/2013  . Abnormal nuclear stress test 05/17/2012  . S/P CFX PTCAI for ISR 02/01/15 05/17/2012  . PVD, chronic LLE 12/22/2011  . Rheumatoid arthritis ([place]) 12/22/2011  . Contrast media allergy 12/22/2011  . Tobacco abuse 12/21/2011   Past Medical History:  Past Medical History:  Diagnosis Date  . Anxiety   . COPD (chronic obstructive pulmonary disease) ([place])   . Coronary artery disease   . Full dentures   . GERD (gastroesophageal reflux disease)    Rolaids as needed  . High cholesterol   . History of MI (myocardial infarction)   . Hypertension    med. dosage increased 04/2016; has been on BP  med. x 10 yrs.  . Incarcerated umbilical hernia 0000000  . Inguinal hernia 05/2016   bilateral   . Insulin dependent diabetes mellitus ([place])   . Ischemic cardiomyopathy    a. 03/2015 EF 40-45% by LV gram.  . Left leg cellulitis 10/08/2016  . Rheumatoid arthritis(714.0)    Past Surgical  History:  Past Surgical History:  Procedure Laterality Date  . CARDIAC CATHETERIZATION N/A 02/01/2015   Procedure: Left Heart Cath and Coronary Angiography;  Surgeon: Belva Crome, MD;  Location: [place] CV LAB;  Service: Cardiovascular;  Laterality: N/A;  . CARDIAC CATHETERIZATION N/A 03/22/2015   Procedure: Left Heart Cath and Coronary Angiography;  Surgeon: Leonie Man, MD;  Location: [place] CV LAB;  Service: Cardiovascular;  Laterality: N/A;  . CARDIAC CATHETERIZATION  09/05/2002; 03/09/2003; 04/10/2005;06/02/2005; 05/09/2006; 02/09/2007; 03/01/2007  . COLONOSCOPY  2008   Dr. Gala Romney: diverticulosis, hyperplastic polyp.  . CORONARY ANGIOPLASTY  11/14/2012; 02/01/2015  . CORONARY ANGIOPLASTY WITH STENT PLACEMENT  05/16/2012   "1; makes total ~ 4"  . CORONARY STENT INTERVENTION N/A 06/06/2017   Procedure: CORONARY STENT INTERVENTION;  Surgeon: Belva Crome, MD;  Location: [place] CV LAB;  Service: Cardiovascular;  Laterality: N/A;  . INSERTION OF MESH Bilateral 05/24/2016   Procedure: INSERTION OF MESH;  Surgeon: Coralie Keens, MD;  Location: [place];  Service: General;  Laterality: Bilateral;  . LAPAROSCOPIC CHOLECYSTECTOMY    . LAPAROSCOPIC INGUINAL HERNIA WITH UMBILICAL HERNIA Bilateral 05/24/2016   Procedure: BILATERAL LAPAROSCOPIC INGUINAL HERNIA REPAIR WITH MESH AND UMBILICAL HERNIA REPAIR WITH MESH;  Surgeon: Coralie Keens, MD;  Location: [place];  Service: General;  Laterality: Bilateral;  . LEFT HEART CATH AND CORONARY ANGIOGRAPHY N/A 06/06/2017   Procedure: LEFT HEART CATH AND CORONARY ANGIOGRAPHY;  Surgeon: Belva Crome, MD;  Location: [place] CV LAB;  Service: Cardiovascular;  Laterality: N/A;  . LEFT HEART CATHETERIZATION WITH CORONARY ANGIOGRAM N/A 12/22/2011   Procedure: LEFT HEART CATHETERIZATION WITH CORONARY ANGIOGRAM;  Surgeon: Troy Sine, MD;  Location: [facility] CATH LAB;  Service: Cardiovascular;  Laterality: N/A;  . LEFT  HEART CATHETERIZATION WITH CORONARY ANGIOGRAM Bilateral 05/16/2012   Procedure: LEFT HEART CATHETERIZATION WITH CORONARY ANGIOGRAM;  Surgeon: Lorretta Harp, MD;  Location: [facility] CATH LAB;  Service: Cardiovascular;  Laterality: Bilateral;  . LEFT HEART CATHETERIZATION WITH CORONARY ANGIOGRAM N/A 11/14/2012   Procedure: LEFT HEART CATHETERIZATION WITH CORONARY ANGIOGRAM;  Surgeon: Troy Sine, MD;  Location: [facility] CATH LAB;  Service: Cardiovascular;  Laterality: N/A;  . LEFT HEART CATHETERIZATION WITH CORONARY ANGIOGRAM N/A 09/01/2013   Procedure: LEFT HEART CATHETERIZATION WITH CORONARY ANGIOGRAM;  Surgeon: Leonie Man, MD;  Location: [facility] CATH LAB;  Service: Cardiovascular;  Laterality: N/A;  . [place]; 1973; 1985   x 3  . NM MYOCAR PERF WALL MOTION  08/30/2009   No significant ischemia  . OPEN REDUCTION INTERNAL FIXATION (ORIF) DISTAL RADIAL FRACTURE Right 12/10/2016   Procedure: OPEN REDUCTION INTERNAL FIXATION (ORIF) DISTAL RADIAL FRACTURE;  Surgeon: Iran Planas, MD;  Location: [place];  Service: Orthopedics;  Laterality: Right;  . PERCUTANEOUS CORONARY INTERVENTION-BALLOON ONLY  12/22/2011   Procedure: PERCUTANEOUS CORONARY INTERVENTION-BALLOON ONLY;  Surgeon: Troy Sine, MD;  Location: [facility] CATH LAB;  Service: Cardiovascular;;  . PERCUTANEOUS CORONARY INTERVENTION-BALLOON ONLY  11/14/2012   Procedure: PERCUTANEOUS CORONARY INTERVENTION-BALLOON ONLY;  Surgeon: Troy Sine, MD;  Location: [facility] CATH LAB;  Service: Cardiovascular;;  . PERCUTANEOUS CORONARY STENT INTERVENTION (PCI-S)  05/16/2012   Procedure: PERCUTANEOUS CORONARY STENT INTERVENTION (PCI-S);  Surgeon: Lorretta Harp, MD;  Location: [facility] CATH LAB;  Service: Cardiovascular;;  . PERCUTANEOUS CORONARY STENT INTERVENTION (PCI-S)  09/01/2013   Procedure: PERCUTANEOUS CORONARY STENT INTERVENTION (PCI-S);  Surgeon: Leonie Man, MD;  Location: [facility] CATH LAB;  Service: Cardiovascular;;   Social History:  reports that he has  been smoking cigarettes. He has been smoking about 1.00 pack per day. He has never used smokeless tobacco. He reports that he does not drink alcohol or use drugs.  Family / Support Systems Marital Status: Widow/Widower How Long?: Jan 18, 2019 Patient Roles: Parent Children: daughter, Robert [name] @ [phone];  son, Robert [name] @ [phone] - both local;  step-son, Robert [name] (living in the home with pt) Anticipated Caregiver: daughter Robert [name]), son Robert [name]), and step son Robert [name]) Ability/Limitations of Caregiver: pt states Robert [name] is mentally handicapped but able to take care of himself and that Robert [name] and Robert [name] can help if needed.  They both confirm this.  Caregiver Availability: 24/7 Family Dynamics: Pt describes all his children as very supportive and denies any concerns about having needed assistance available at home.  Social History Preferred language: English Religion: Baptist Cultural Background: NA Read: Yes Write: Yes Employment Status: Retired Public relations account executive Issues: None Guardian/Conservator: None - per MD, pt is capable of making decisions on his own behalf.   Abuse/Neglect Abuse/Neglect Assessment Can Be Completed: Yes Physical Abuse: Denies Verbal Abuse: Denies Sexual Abuse: Denies Exploitation of patient/patient's resources: Denies Self-Neglect: Denies  Emotional Status Pt's affect, behavior and adjustment status: Pt lying in bed and appears very fatigued from morning therapies but agreeable to assessment interview.  Very pleasant and answers questions without difficulty.  He is VERY hopeful for a "quick recovery" and hopeful for home d/c "sooner rather than later."  He is realistic about his current level of debility and need for therapy.  He talks about his wife's death in Jan 18, 2023 and brother's death in Feb 17, 2023 both in this year.  Recognizes these losses have been "difficult" to deal with but feels that, overall, he is "doing OK" with mental health.  May  benefit from neuropsychology consult to further address mood. Recent Psychosocial Issues: As noted, wife and brother died in 01/18/2023 and Feb 17, 2023 of this year. Psychiatric History: Pt denies any h/o psychiatric issues, however, anxiety is noted on chart. Substance Abuse History: NA  Patient / Family Perceptions, Expectations & Goals Pt/Family understanding of illness & functional limitations: Pt and daughter have good understanding of his current level of debility, other chronic health issues and need for CIR based therapy. Premorbid pt/family roles/activities: Pt was completely independent PTA. Anticipated changes in roles/activities/participation: Per goals of CGA overall, family members to share in caregiver support role. Pt/family expectations/goals: "I hope I get home soon."  [employer]: None Premorbid Home Care/DME Agencies: None Transportation available at discharge: yes Resource referrals recommended: Neuropsychology  Discharge Planning Living Arrangements: Children Support Systems: Children Type of Residence: Private residence Insurance Resources: [employer] Financial Resources: Social Security Financial Screen Referred: No Living Expenses: Own Money Management: Patient Does the patient have any problems obtaining your medications?: No Home Management: pt and step-son Patient/Family Preliminary Plans: Pt to return to his home with step-son and other children providing 24/7 support Social Work Anticipated Follow Up Needs: HH/OP Expected length of stay: 3 weeks  Clinical Impression Very debilitated gentleman here following COVID illness and ready to begin therapies and "get home".  He reports having good support (can  cover 24/7) from local adult children - daughter confirms.  Of note, he did suffer loss of wife and brother in April/ May of this year but feels he is coping well with this.  He states he is motivated for CIR.  Will likely refer for  neuropsychology consult while here to assess mood further.    Robert [name] 05/23/2019, 3:00 PM

## 2019-05-23 NOTE — Progress Notes (Signed)
Closely monitored closely throughout shift, no further episodes noted of acute respiratory distress. Continue to monitor closely, O2 sats at 98 %  On nasal oxygen, restful

## 2019-05-24 ENCOUNTER — Inpatient Hospital Stay (HOSPITAL_COMMUNITY): Payer: Medicare HMO | Admitting: Physical Therapy

## 2019-05-24 ENCOUNTER — Inpatient Hospital Stay (HOSPITAL_COMMUNITY): Payer: Medicare HMO | Admitting: Speech Pathology

## 2019-05-24 ENCOUNTER — Inpatient Hospital Stay (HOSPITAL_COMMUNITY): Payer: Medicare HMO

## 2019-05-24 DIAGNOSIS — R5381 Other malaise: Principal | ICD-10-CM

## 2019-05-24 LAB — GLUCOSE, CAPILLARY
Glucose-Capillary: 234 mg/dL — ABNORMAL HIGH (ref 70–99)
Glucose-Capillary: 241 mg/dL — ABNORMAL HIGH (ref 70–99)
Glucose-Capillary: 160 mg/dL — ABNORMAL HIGH (ref 70–99)
Glucose-Capillary: 232 mg/dL — ABNORMAL HIGH (ref 70–99)

## 2019-05-24 NOTE — Progress Notes (Signed)
Occupational Therapy Session Note  Patient Details  Name: Robert [name] MRN: [id number] Date of Birth: 08/09/1951  Today's Date: 05/24/2019 OT Individual Time: 4098-1191 OT Individual Time Calculation (min): 69 min    Short Term Goals: Week 1:  OT Short Term Goal 1 (Week 1): Pt will complete BSC/toilet transfer with max A with LRAD OT Short Term Goal 2 (Week 1): Pt will thread BLE into pants with AE PRN and MIN A OT Short Term Goal 3 (Week 1): Pt will dof/don socks with AE and no VC to demo improved memory OT Short Term Goal 4 (Week 1): Pt will don shirt with S  Skilled Therapeutic Interventions/Progress Updates:    1:1. Pt recievd in bed with minimal pain in LEs and no intervention requested. Pt agreeable to bathe and dress this session. Pt supine>sitting EOB with bed rails and S overall. Pt completes UB bathing seated EOB with set up and increased time for rest breaks. Pt reports needing to toilet. MIN A sit to stand in stedy to transfer onto toilet with total A for components of toileting and increased time for continent bowel void. Pt declines bathing LEs. Pt threads BLE into pants with VC  For AE technique and MIN A using reacher. Pt changes socks with reacher and sock aide with MIN A to pull sock up foot d/t decreased arm strength. Pt sit to stand at sink with MOD A while OT advances pants past hips. Pt agreeable to go outside for BUE therex as follows with 1# dowel rod for global UE strengthening required for BADLs/transfers 2x15  Exited session with pt seated in bed with call light in reach and all needs met.   Therapy Documentation Precautions:  Precautions Precautions: Fall Precaution Comments: sacral wound, supplemental oxygen Required Braces or Orthoses: Other Brace Other Brace: B Prevalon boots Restrictions Weight Bearing Restrictions: No General: General PT Missed Treatment Reason: Patient fatigue Vital Signs: Therapy Vitals Temp: 97.9 F (36.6 C) Temp Source:  Oral Pulse Rate: 79 Resp: 17 BP: 132/78 Patient Position (if appropriate): Lying Oxygen Therapy SpO2: 96 % O2 Device: Nasal Cannula O2 Flow Rate (L/min): 2 L/min Pain:   ADL: ADL Grooming: Other (comment)(refused) Upper Body Bathing: Supervision/safety Where Assessed-Upper Body Bathing: Edge of bed Lower Body Bathing: Maximal assistance Where Assessed-Lower Body Bathing: Edge of bed(standing EOB) Upper Body Dressing: Minimal assistance Where Assessed-Upper Body Dressing: Edge of bed Lower Body Dressing: Maximal assistance Where Assessed-Lower Body Dressing: Edge of bed Toileting: Not assessed Toilet Transfer: Dependent(stedy) Vision   Perception    Praxis   Exercises:   Other Treatments:     Therapy/Group: Individual Therapy  Tonny Branch 05/24/2019, 2:10 PM

## 2019-05-24 NOTE — Progress Notes (Signed)
Physical Therapy Session Note  Patient Details  Name: Robert [name] MRN: [id number] Date of Birth: [birth year]  Today's Date: 05/24/2019 PT Individual Time: [id number] PT Individual Time Calculation (min): 45 min   Short Term Goals: Week 1:  PT Short Term Goal 1 (Week 1): Pt will complete bed mobility consistently with CGA. PT Short Term Goal 2 (Week 1): Pt will complete bed<>w/c transfers with mod assist +1. PT Short Term Goal 3 (Week 1): Pt will ambulate 15 ft with LRAD & max assist +1. PT Short Term Goal 4 (Week 1): Pt will propel w/c x 50 ft with BUE & supervision.  Skilled Therapeutic Interventions/Progress Updates:  Pt was seen bedside in the am. Pt willing to participate with therapy. Pt transferred supine to edge of bed with min A and verbal cues with side rail. Pt tolerated edge of bed about 30 minutes with S. While at edge of bed, pt stood x 6 with rolling walker and min to mod A with verbal cues. Pt's standing tolerance fluctuated from 15 to 30 seconds. While standing worked on Warden/ranger and pre gait activities. Pt very reluctant to transfer due to concern his legs wouldn't hold him up. Pt was able to side step down edge of bed with min A and rolling walker with verbal cues. Pt requested to return to supine due to fatigue.  Pt transferred edge of bed to supine with min A and verbal cues. Pt left sitting up in bed with call bell within reach and bed alarm on.  Therapy Documentation Precautions:  Precautions Precautions: Fall Precaution Comments: sacral wound, supplemental oxygen Required Braces or Orthoses: Other Brace Other Brace: B Prevalon boots Restrictions Weight Bearing Restrictions: No General: PT Amount of Missed Time (min): 15 Minutes PT Missed Treatment Reason: Patient fatigue Vital Signs: Oxygen Therapy SpO2: 91 % O2 Device: Nasal Cannula O2 Flow Rate (L/min): 1.5 L/min Pain: Pain Assessment Pain Scale: 0-10 Pain Score: 0-No pain  Therapy/Group: Individual  Therapy  Dub Amis 05/24/2019, 11:55 AM

## 2019-05-24 NOTE — Progress Notes (Signed)
Speech Language Pathology Daily Session Note  Patient Details  Name: Robert [name] MRN: [id number] Date of Birth: 15-Sep-1950  Today's Date: 05/24/2019 SLP Individual Time: [id number] SLP Individual Time Calculation (min): 40 min  Short Term Goals: Week 1: SLP Short Term Goal 1 (Week 1): Patient will demonstrate functional problem solving for complex tasks with Mod I. SLP Short Term Goal 2 (Week 1): Patient will recall new, daily information with use of external aids with Mod I. SLP Short Term Goal 3 (Week 1): Patient will consume current diet with minimal overt s/s of aspiration with overall supervision level verbal cues for use of swallowing compensatory strategies. SLP Short Term Goal 4 (Week 1): Patient will demonstrate efficient mastication with complete oral clearance of regular textures without overt s/s of aspiration with supervision level verbal cues over 2 sessions prior to upgrade. SLP Short Term Goal 5 (Week 1): Patient will consume trials of thin liquids with minimal overt s/s of aspiration over 2 sessions to assess readiness for repeat MBS.  Skilled Therapeutic Interventions: Patient received skilled SLP services targeting dysphagia. Assisted patient with oral care prior to trials of thin liquids. Patient completed trials of thin liquids x10 with use of chin tuck. Patient was able to implement use of chin tuck on 10/10 trials with min verbal cues. An immediate cough was noted x2 with trials of thin liquids. With trials of dysphagia 3 patient demonstrated functional mastication with no oral residues noted. Patient participated in swallowing exercises including: effortful swallow with ice chip boluses x10, masako x10, and lingual resistance x10 requiring mod verbal cues for completion. At the end of therapy session patient was upright in bed, bed alarm activated, and all needs within reach.  Pain Pain Assessment Pain Scale: 0-10 Pain Score: 0-No pain  Therapy/Group: Individual  Therapy  Cristy Folks, [place] 05/24/2019, 9:01 AM

## 2019-05-24 NOTE — Progress Notes (Signed)
ANTICOAGULATION CONSULT NOTE - [place] for apixaban Indication: Afib  Allergies  Allergen Reactions  . Ivp Dye [Iodinated Diagnostic Agents] Itching and Rash    Patient Measurements: Height: 6' (182.9 cm) Weight: 225 lb 8.5 oz (102.3 kg) IBW/kg (Calculated) : 77.6 Heparin Dosing Weight: 99 kg   Vital Signs: Temp: 98.4 F (36.9 C) (09/12 0337) Temp Source: Oral (09/12 0337) BP: 123/63 (09/12 0337) Pulse Rate: 79 (09/12 0337)  Labs: Recent Labs    05/22/19 0407 05/23/19 0608  HGB 7.8* 9.1*  HCT 24.3* 28.5*  PLT 218 248  CREATININE 1.37* 1.30*    Estimated Creatinine Clearance: 69.2 mL/min (A) (by C-G formula based on SCr of 1.3 mg/dL (H)).  Assessment: 10 YOM with prolonged COVID-19 associated hospital stay now with recurrent acute hypoxemic respiratory failure. Pharmacy initially consulted for IV heparin for ACS, now consulted for apixaban for Afib.   Of note, previously started on IV heparin 9/2, then discontinued 9/3 due to bleeding from endotracheal tube during previous intubation and blood clots in urine (has catheter for retention); conservative medical management recommended by Cardiology.  Aspirin & plavix continued.  Last Hgb 9.1, plt 248 on 9/11. No s/sx of bleeding.   Goal of Therapy:  Monitor platelets by anticoagulation protocol: Yes   Plan:  Continue Apixaban 5 mg PO BID Continue to monitor CBC closely given bleeding history.  Antonietta Jewel, PharmD, BCCCP Clinical Pharmacist  Phone: [phone]  Please check AMION for all [place] phone numbers After 10:00 PM, call [place] [phone] 05/24/2019 10:08 AM

## 2019-05-24 NOTE — Progress Notes (Signed)
[place] PHYSICAL MEDICINE & REHABILITATION PROGRESS NOTE   Subjective/Complaints:  No issues overnite per RN, denies any pain   ROS: Patient deniesnausea, vomiting, diarrhea, chest pain,.    Objective:   Dg Chest 2 View  Result Date: 05/23/2019 CLINICAL DATA:  Cough and shortness of breath. EXAM: CHEST - 2 VIEW COMPARISON:  05/22/2019 FINDINGS: Heart size is stable. Symmetric bilateral interstitial and lower lobe predominant airspace disease shows mild improvement since previous study, suggesting decreased pulmonary edema. Small left pleural effusion also appears decreased. Aortic atherosclerosis. IMPRESSION: Mild improvement in symmetric bilateral interstitial and airspace disease and small left pleural effusion, likely due to improving congestive heart failure. Electronically Signed   By: Marlaine Hind M.D.   On: 05/23/2019 09:37   Dg Chest Port 1 View  Result Date: 05/22/2019 CLINICAL DATA:  Acute respiratory distress EXAM: PORTABLE CHEST 1 VIEW COMPARISON:  05/19/2019 FINDINGS: Heart is borderline in size. Diffuse bilateral airspace opacities and interstitial prominence is similar prior study. Small left pleural effusion. Favor edema although infection is not excluded. No acute bony abnormality. IMPRESSION: Continued diffuse interstitial and airspace opacities concerning for edema or infection. Small left effusion. Electronically Signed   By: Rolm Baptise M.D.   On: 05/22/2019 20:43   Recent Labs    05/22/19 0407 05/23/19 0608  WBC 5.9 6.1  HGB 7.8* 9.1*  HCT 24.3* 28.5*  PLT 218 248   Recent Labs    05/22/19 0407 05/23/19 0608  NA 135 138  K 2.9* 4.3  CL 99 101  CO2 25 30  GLUCOSE 229* 179*  BUN 16 14  CREATININE 1.37* 1.30*  CALCIUM 8.5* 8.8*    Intake/Output Summary (Last 24 hours) at 05/24/2019 1045 Last data filed at 05/24/2019 0800 Gross per 24 hour  Intake 780 ml  Output 450 ml  Net 330 ml     Physical Exam: Vital Signs Blood pressure 123/63, pulse 79,  temperature 98.4 F (36.9 C), temperature source Oral, resp. rate 18, height 6' (1.829 m), weight 102.3 kg, SpO2 91 %.  Constitutional: No distress . Vital signs reviewed. HEENT: EOMI, oral membranes moist Neck: supple Cardiovascular: RRR without murmur. No JVD    Respiratory: scattered wheezes, O2 via [place]    GI: BS +, non-tender, non-distended  Musculoskeletal:  General: No deformityor edema.  Comments: LLE with min edema and stasis changes on shin with dry flaky scab--stable. Neurological: He is alertand oriented to person, place, and time. Nocranial nerve deficit.functional memory and insight.  UE 3+ prox to 4/5 distally. RLE: 2+HF, 4-KE and 4/5 ADF/PF. LLE: 2/5 HF, 2+ KE and 2-/5 ADF/PF.   Psychiatric: pleasant but sl anxious Skin: multiple decubiti noted--largest on right buttock below--unchanged from 9/10    Assessment/Plan: 1. Functional deficits secondary to debility which require 3+ hours per day of interdisciplinary therapy in a comprehensive inpatient rehab setting.  Physiatrist is providing close team supervision and 24 hour management of active medical problems listed below.  Physiatrist and rehab team continue to assess barriers to discharge/monitor patient progress toward functional and medical goals  Care Tool:  Bathing    Body parts bathed by patient: Right arm, Left arm, Chest, Abdomen, Front perineal area, Face   Body parts bathed by helper: Buttocks, Right upper leg, Left upper leg, Right lower leg, Left lower leg     Bathing assist Assist Level: Maximal Assistance - Patient 24 - 49%     Upper Body Dressing/Undressing Upper body dressing   What is the patient  wearing?: Pull over shirt    Upper body assist Assist Level: Minimal Assistance - Patient > 75%    Lower Body Dressing/Undressing Lower body dressing      What is the patient wearing?: Underwear/pull up     Lower body assist Assist for lower body dressing: Total Assistance  - Patient < 25%     Toileting Toileting Toileting Activity did not occur (Clothing management and hygiene only): N/A (no void or bm)  Toileting assist Assist for toileting: Total Assistance - Patient < 25%(has foley but also incontinent of BM)     Transfers Chair/bed transfer  Transfers assist  Chair/bed transfer activity did not occur: Safety/medical concerns  Chair/bed transfer assist level: 2 Helpers     Locomotion Ambulation   Ambulation assist   Ambulation activity did not occur: Safety/medical concerns          Walk 10 feet activity   Assist  Walk 10 feet activity did not occur: Safety/medical concerns        Walk 50 feet activity   Assist Walk 50 feet with 2 turns activity did not occur: Safety/medical concerns         Walk 150 feet activity   Assist Walk 150 feet activity did not occur: Safety/medical concerns         Walk 10 feet on uneven surface  activity   Assist Walk 10 feet on uneven surfaces activity did not occur: Safety/medical concerns         Wheelchair     Assist Will patient use wheelchair at discharge?: (TBD) Type of Wheelchair: Manual Wheelchair activity did not occur: Refused         Wheelchair 50 feet with 2 turns activity    Assist    Wheelchair 50 feet with 2 turns activity did not occur: Refused       Wheelchair 150 feet activity     Assist  Wheelchair 150 feet activity did not occur: Refused       Blood pressure 123/63, pulse 79, temperature 98.4 F (36.9 C), temperature source Oral, resp. rate 18, height 6' (1.829 m), weight 102.3 kg, SpO2 91 %.  Medical Problem List and Plan: 1.Functional and mobility deficitssecondary to debility after Covid respiratory failure --Patient is beginning CIR therapies today including PT, OT, and SLP  2.A fib/Antithrombotics: -DVT/anticoagulation:Pharmaceutical:Other (comment)--Eliquis. -antiplatelet therapy: On  Plavix. 3. Pain Management:oxycodone prn. 4. Mood:LCSW to follow for evaluation and support. -antipsychotic agents: N/A 5. Neuropsych: This patientiscapable of making decisions on hisown behalf. 6.Sacral decub/Skin/Wound Care: -requested air mattress. -Continue Santyl with damp to dry dressings to slough on decubiti, per RN, wound looks same as picture this am 9/12 -consider referral for hydrotherapy on Monday if no improvement over weekend.  7. Fluids/Electrolytes/Nutrition: encourage PO  -intake reasonable so far  -add protein supp for low albumin  -iron studies suggest anemia of chronic disease  -I personally reviewed the patient's labs today.   8. CAD/ICM: Not surgical candidate. Treated medically with metoprolol, Plavix and lipitor. 9. Acute on chronic systolic CHF: Monitor for signs of over load and check weights daily. Now on 40mg  iv Lasix daily---change to PO beginning 9/12.   Filed Weights   05/24/19 0337  Weight: 102.3 kg   ---needs daily weights! 10 Hypokalemia: k+ up to 4.3 today---continue kdur 11. T2DM: Hgb A1c- 7.7. Was on Toujeo, metformin and acarbose PTA. Acarbose added back on 9/08 with novolog for meal coverage.   -sugars labile, higher over last 24 hours  -hesitate  to resume metformin given his borderline Cr  -will start lantus 12U CBG (last 3)  Recent Labs    05/23/19 1648 05/23/19 2103 05/24/19 0627  GLUCAP 168* 183* 234*    - continue to monitor BS ac/hs and resume home meds as indicated.  12. Anxiety disorder: Provide ego support. Continue Xanax prn. 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 Amil Amen. Stable off methotrexate/ Actemra at this time.  14. Urinary retention: Continue foley due to sacral decub and immobility.  Continue  flomax  -no urecholine while foley's in. -consider voiding trialnext week 13. COPD: Now on Dulera BID with nebs prn,. wean oxygen as able but may have some longer term needs given his COVID ARDS  14. HTN: Has supine HTN? Orthostatic changes noted with therapy on acute. Initial orthostatics are not overwhelming this morning.    Vitals:   05/24/19 0818 05/24/19 0819  BP:    Pulse:    Resp:    Temp:    SpO2: 91% 91%   15. PAF: In NSR on metoprolol and Eliquis.    LOS: 2 days A FACE TO FACE EVALUATION WAS PERFORMED  Charlett Blake 05/24/2019, 10:45 AM

## 2019-05-24 NOTE — Plan of Care (Signed)
  Problem: Consults Goal: RH GENERAL PATIENT EDUCATION Description: See Patient Education module for education specifics. Outcome: Progressing Goal: Skin Care Protocol Initiated - if Braden Score 18 or less Description: If consults are not indicated, leave blank or document N/A Outcome: Progressing Goal: Nutrition Consult-if indicated Outcome: Progressing Goal: Diabetes Guidelines if Diabetic/Glucose > 140 Description: If diabetic or lab glucose is > 140 mg/dl - Initiate Diabetes/Hyperglycemia Guidelines & Document Interventions  Outcome: Progressing   Problem: RH BOWEL ELIMINATION Goal: RH STG MANAGE BOWEL WITH ASSISTANCE Description: STG Manage Bowel with  min Assistance. Outcome: Progressing Goal: RH STG MANAGE BOWEL W/MEDICATION W/ASSISTANCE Description: STG Manage Bowel with Medication with min Assistance. Outcome: Progressing   Problem: RH BLADDER ELIMINATION Goal: RH STG MANAGE BLADDER WITH EQUIPMENT WITH ASSISTANCE Description: STG Manage Bladder With Equipment With min Assistance Outcome: Progressing   Problem: RH SKIN INTEGRITY Goal: RH STG SKIN FREE OF INFECTION/BREAKDOWN Description: Skin will show improvement (wounds from admission) from reported measurements during wound assessments during rehab stay Outcome: Progressing Goal: RH STG MAINTAIN SKIN INTEGRITY WITH ASSISTANCE Description: STG Maintain Skin Integrity With min Assistance. Outcome: Progressing Goal: RH STG ABLE TO PERFORM INCISION/WOUND CARE W/ASSISTANCE Description: STG Able To Perform Incision/Wound Care With min Assistance. Outcome: Progressing   Problem: RH SAFETY Goal: RH STG ADHERE TO SAFETY PRECAUTIONS W/ASSISTANCE/DEVICE Description: STG Adhere to Safety Precautions With min Assistance/Device. Outcome: Progressing   Problem: RH PAIN MANAGEMENT Goal: RH STG PAIN MANAGED AT OR BELOW PT'S PAIN GOAL Description: Pt will report pain of less than 3 out of 10 Outcome: Progressing   Problem: RH  KNOWLEDGE DEFICIT GENERAL Goal: RH STG INCREASE KNOWLEDGE OF SELF CARE AFTER HOSPITALIZATION Description: Pt will be able to verbalize 2 ways to decrease risk of infection once he returns home Outcome: Progressing

## 2019-05-25 ENCOUNTER — Inpatient Hospital Stay (HOSPITAL_COMMUNITY): Payer: Medicare HMO

## 2019-05-25 LAB — GLUCOSE, CAPILLARY
Glucose-Capillary: 179 mg/dL — ABNORMAL HIGH (ref 70–99)
Glucose-Capillary: 163 mg/dL — ABNORMAL HIGH (ref 70–99)
Glucose-Capillary: 220 mg/dL — ABNORMAL HIGH (ref 70–99)
Glucose-Capillary: 220 mg/dL — ABNORMAL HIGH (ref 70–99)
Glucose-Capillary: 209 mg/dL — ABNORMAL HIGH (ref 70–99)

## 2019-05-25 NOTE — Progress Notes (Signed)
[place] PHYSICAL MEDICINE & REHABILITATION PROGRESS NOTE   Subjective/Complaints:   ROS: Patient deniesnausea, vomiting, diarrhea, chest pain,.    Objective:   No results found. Recent Labs    05/23/19 0608  WBC 6.1  HGB 9.1*  HCT 28.5*  PLT 248   Recent Labs    05/23/19 0608  NA 138  K 4.3  CL 101  CO2 30  GLUCOSE 179*  BUN 14  CREATININE 1.30*  CALCIUM 8.8*    Intake/Output Summary (Last 24 hours) at 05/25/2019 1025 Last data filed at 05/25/2019 1016 Gross per 24 hour  Intake 920 ml  Output 3950 ml  Net -3030 ml     Physical Exam: Vital Signs Blood pressure 128/67, pulse 78, temperature 98 F (36.7 C), temperature source Oral, resp. rate 19, height 6' (1.829 m), weight 102.3 kg, SpO2 95 %.  Constitutional: No distress . Vital signs reviewed. HEENT: EOMI, oral membranes moist Neck: supple Cardiovascular: RRR without murmur. No JVD    Respiratory: scattered wheezes, O2 via [place]    GI: BS +, non-tender, non-distended  Musculoskeletal:  General: No deformityor edema.  Comments: LLE with min edema and stasis changes on shin with dry flaky scab--stable. Neurological: He is alertand oriented to person, place, and time. Nocranial nerve deficit.functional memory and insight.  UE 3+ prox to 4/5 distally. RLE: 2+HF, 4-KE and 4/5 ADF/PF. LLE: 2/5 HF, 2+ KE and 2-/5 ADF/PF.   Psychiatric: pleasant but sl anxious Skin: multiple decubiti noted--largest on right buttock below--unchanged from 9/10    Assessment/Plan: 1. Functional deficits secondary to debility which require 3+ hours per day of interdisciplinary therapy in a comprehensive inpatient rehab setting.  Physiatrist is providing close team supervision and 24 hour management of active medical problems listed below.  Physiatrist and rehab team continue to assess barriers to discharge/monitor patient progress toward functional and medical goals  Care Tool:  Bathing    Body parts  bathed by patient: Right arm, Left arm, Chest, Abdomen, Face   Body parts bathed by helper: Buttocks, Right upper leg, Left upper leg, Right lower leg, Left lower leg     Bathing assist Assist Level: Supervision/Verbal cueing(UB ONLY)     Upper Body Dressing/Undressing Upper body dressing   What is the patient wearing?: Pull over shirt    Upper body assist Assist Level: Supervision/Verbal cueing    Lower Body Dressing/Undressing Lower body dressing      What is the patient wearing?: Pants     Lower body assist Assist for lower body dressing: Moderate Assistance - Patient 50 - 74%     Toileting Toileting Toileting Activity did not occur (Clothing management and hygiene only): N/A (no void or bm)  Toileting assist Assist for toileting: Total Assistance - Patient < 25%     Transfers Chair/bed transfer  Transfers assist  Chair/bed transfer activity did not occur: Safety/medical concerns  Chair/bed transfer assist level: 2 Helpers     Locomotion Ambulation   Ambulation assist   Ambulation activity did not occur: Safety/medical concerns          Walk 10 feet activity   Assist  Walk 10 feet activity did not occur: Safety/medical concerns        Walk 50 feet activity   Assist Walk 50 feet with 2 turns activity did not occur: Safety/medical concerns         Walk 150 feet activity   Assist Walk 150 feet activity did not occur: Safety/medical concerns  Walk 10 feet on uneven surface  activity   Assist Walk 10 feet on uneven surfaces activity did not occur: Safety/medical concerns         Wheelchair     Assist Will patient use wheelchair at discharge?: (TBD) Type of Wheelchair: Manual Wheelchair activity did not occur: Refused         Wheelchair 50 feet with 2 turns activity    Assist    Wheelchair 50 feet with 2 turns activity did not occur: Refused       Wheelchair 150 feet activity     Assist   Wheelchair 150 feet activity did not occur: Refused       Blood pressure 128/67, pulse 78, temperature 98 F (36.7 C), temperature source Oral, resp. rate 19, height 6' (1.829 m), weight 102.3 kg, SpO2 95 %.  Medical Problem List and Plan: 1.Functional and mobility deficitssecondary to debility after Covid respiratory failure --Patient is beginning CIR therapies today including PT, OT, and SLP  2.A fib/Antithrombotics: -DVT/anticoagulation:Pharmaceutical:Other (comment)--Eliquis. -antiplatelet therapy: On Plavix. 3. Pain Management:oxycodone prn. 4. Mood:LCSW to follow for evaluation and support. -antipsychotic agents: N/A 5. Neuropsych: This patientiscapable of making decisions on hisown behalf. 6.Sacral decub/Skin/Wound Care: -requested air mattress. -Continue Santyl with damp to dry dressings to slough on decubiti, per RN, wound looks same as picture this am 9/12 -consider referral for hydrotherapy on Monday if no improvement over weekend.  7. Fluids/Electrolytes/Nutrition: encourage PO  -intake reasonable so far  -add protein supp for low albumin  -iron studies suggest anemia of chronic disease  -I personally reviewed the patient's labs today.   8. CAD/ICM: Not surgical candidate. Treated medically with metoprolol, Plavix and lipitor. 9. Hx fluid overload: Monitor for signs of over load and check weights daily.Cont lasix 40mg  po daily   On chronic lasix with normal EF, monitor I/Os intake per pt is less than at home, no evidence of perpheral edema , lungs clear will moinitr on 40mg   (home dose 80mg )  Filed Weights   05/24/19 0337 05/25/19 0446  Weight: 102.3 kg 102.3 kg   ---needs daily weights! 10 Hypokalemia: k+ up to 4.3 today---continue kdur 11. T2DM: Hgb A1c- 7.7. Was on Toujeo, metformin and acarbose PTA. Acarbose added back on 9/08 with novolog for meal coverage.   -sugars  labile, higher over last 24 hours  -hesitate to resume metformin given his borderline Cr  -will start lantus 12U CBG (last 3)  Recent Labs    05/24/19 2118 05/25/19 0607 05/25/19 0911  GLUCAP 232* 179* 163*  still on high side in am will increase lantus   - continue to monitor BS ac/hs and resume home meds as indicated.  12. Anxiety disorder: Provide ego support. Continue Xanax prn. 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 Amil Amen. Stable off methotrexate/ Actemra at this time.  14. Urinary retention: Continue foley due to sacral decub and immobility.  Continue flomax  -no urecholine while foley's in. -consider voiding trialnext week 13. COPD: Now on Dulera BID with nebs prn,. wean oxygen as able but may have some longer term needs given his COVID ARDS  14. HTN: Has supine HTN? Orthostatic changes noted with therapy on acute. Initial orthostatics are not overwhelming this morning.    Vitals:   05/25/19 0446 05/25/19 0813  BP: 128/67   Pulse: 78   Resp: 19  Temp: 98 F (36.7 C)   SpO2: 93% 95%  controlled  15. PAF: In NSR normal rate  on metoprolol and Eliquis.   LOS: 3 days A FACE TO FACE EVALUATION WAS PERFORMED  Charlett Blake 05/25/2019, 10:25 AM

## 2019-05-25 NOTE — Progress Notes (Signed)
Occupational Therapy Session Note  Patient Details  Name: Robert [name] MRN: [id number] Date of Birth: 17-Jun-1951  Today's Date: 05/25/2019 OT Individual Time: 1530-1640 OT Individual Time Calculation (min): 70 min    Short Term Goals: Week 1:  OT Short Term Goal 1 (Week 1): Pt will complete BSC/toilet transfer with max A with LRAD OT Short Term Goal 2 (Week 1): Pt will thread BLE into pants with AE PRN and MIN A OT Short Term Goal 3 (Week 1): Pt will dof/don socks with AE and no VC to demo improved memory OT Short Term Goal 4 (Week 1): Pt will don shirt with S  Skilled Therapeutic Interventions/Progress Updates:    Pt received on [facility] over toilet with nursing staff present. Pt voided bowels and then stood in stedy with min A. Total A for peri hygiene, mod A to pull up pants in standing. Pt with no c/o pain throughout session. Skilled monitoring of vitals throughout session, 1 L [place] applied while pt sitting in w/c, Spo2 between 90-94%. Charlaine Dalton was used to transfer pt back to w/c. He sat at the sink and completed UB bathing and dressing with (S). Pt stood with mod A at the sink and required mod A for clothing management. Pt changed pants, threading over BLE using a reacher with mod A overall for standing balance assist. Pt required max A to don socks and shoes. Pt requested to leave room and complete UB exericse. Pt completed circuit with BUE holding a 1 # dowel, including biceps, triceps, deltoids, and pec's. Pt cued for breathing techniques and rest breaks. Pt returned to room and requested to use bathroom again. Pt completed stand pivot transfer with max A to toilet with cueing for grab bar use. Pt required max A to stand with use of stedy, fatigue setting in. Pt was transferred back to bed and left supine with 2 L [place] on and all needs met. Bed alarm set.    Therapy Documentation Precautions:  Precautions Precautions: Fall Precaution Comments: sacral wound, supplemental oxygen Required Braces or  Orthoses: Other Brace Other Brace: B Prevalon boots Restrictions Weight Bearing Restrictions: No   Therapy/Group: Individual Therapy  Curtis Sites 05/25/2019, 4:57 PM

## 2019-05-25 NOTE — Plan of Care (Signed)
  Problem: Consults Goal: RH GENERAL PATIENT EDUCATION Description: See Patient Education module for education specifics. Outcome: Progressing Goal: Skin Care Protocol Initiated - if Braden Score 18 or less Description: If consults are not indicated, leave blank or document N/A Outcome: Progressing Goal: Nutrition Consult-if indicated Outcome: Progressing Goal: Diabetes Guidelines if Diabetic/Glucose > 140 Description: If diabetic or lab glucose is > 140 mg/dl - Initiate Diabetes/Hyperglycemia Guidelines & Document Interventions  Outcome: Progressing   Problem: RH BOWEL ELIMINATION Goal: RH STG MANAGE BOWEL WITH ASSISTANCE Description: STG Manage Bowel with  min Assistance. Outcome: Progressing Goal: RH STG MANAGE BOWEL W/MEDICATION W/ASSISTANCE Description: STG Manage Bowel with Medication with min Assistance. Outcome: Progressing   Problem: RH BLADDER ELIMINATION Goal: RH STG MANAGE BLADDER WITH EQUIPMENT WITH ASSISTANCE Description: STG Manage Bladder With Equipment With min Assistance Outcome: Progressing   Problem: RH SKIN INTEGRITY Goal: RH STG SKIN FREE OF INFECTION/BREAKDOWN Description: Skin will show improvement (wounds from admission) from reported measurements during wound assessments during rehab stay Outcome: Progressing Goal: RH STG MAINTAIN SKIN INTEGRITY WITH ASSISTANCE Description: STG Maintain Skin Integrity With min Assistance. Outcome: Progressing Goal: RH STG ABLE TO PERFORM INCISION/WOUND CARE W/ASSISTANCE Description: STG Able To Perform Incision/Wound Care With min Assistance. Outcome: Progressing   Problem: RH SAFETY Goal: RH STG ADHERE TO SAFETY PRECAUTIONS W/ASSISTANCE/DEVICE Description: STG Adhere to Safety Precautions With min Assistance/Device. Outcome: Progressing   Problem: RH PAIN MANAGEMENT Goal: RH STG PAIN MANAGED AT OR BELOW PT'S PAIN GOAL Description: Pt will report pain of less than 3 out of 10 Outcome: Progressing   Problem: RH  KNOWLEDGE DEFICIT GENERAL Goal: RH STG INCREASE KNOWLEDGE OF SELF CARE AFTER HOSPITALIZATION Description: Pt will be able to verbalize 2 ways to decrease risk of infection once he returns home Outcome: Progressing

## 2019-05-26 ENCOUNTER — Inpatient Hospital Stay (HOSPITAL_COMMUNITY): Payer: Medicare HMO

## 2019-05-26 ENCOUNTER — Inpatient Hospital Stay (HOSPITAL_COMMUNITY): Payer: Medicare HMO | Admitting: Occupational Therapy

## 2019-05-26 ENCOUNTER — Inpatient Hospital Stay (HOSPITAL_COMMUNITY): Payer: Medicare HMO | Admitting: Speech Pathology

## 2019-05-26 ENCOUNTER — Other Ambulatory Visit: Payer: Self-pay

## 2019-05-26 ENCOUNTER — Encounter (HOSPITAL_COMMUNITY): Payer: Self-pay | Admitting: *Deleted

## 2019-05-26 DIAGNOSIS — I480 Paroxysmal atrial fibrillation: Secondary | ICD-10-CM

## 2019-05-26 LAB — BASIC METABOLIC PANEL
Anion gap: 10 (ref 5–15)
BUN: 25 mg/dL — ABNORMAL HIGH (ref 8–23)
CO2: 29 mmol/L (ref 22–32)
Calcium: 8.8 mg/dL — ABNORMAL LOW (ref 8.9–10.3)
Chloride: 97 mmol/L — ABNORMAL LOW (ref 98–111)
Creatinine, Ser: 1.27 mg/dL — ABNORMAL HIGH (ref 0.61–1.24)
GFR calc Af Amer: >60 mL/min (ref >60)
GFR calc non Af Amer: 58 mL/min — ABNORMAL LOW (ref >60)
Glucose, Bld: 161 mg/dL — ABNORMAL HIGH (ref 70–99)
Potassium: 4.5 mmol/L (ref 3.5–5.1)
Sodium: 136 mmol/L (ref 135–145)

## 2019-05-26 LAB — CBC
HCT: 26.7 % — ABNORMAL LOW (ref 39.0–52.0)
Hemoglobin: 8.5 g/dL — ABNORMAL LOW (ref 13.0–17.0)
MCH: 29.7 pg (ref 26.0–34.0)
MCHC: 31.8 g/dL (ref 30.0–36.0)
MCV: 93.4 fL (ref 80.0–100.0)
Platelets: 242 10*3/uL (ref 150–400)
RBC: 2.86 MIL/uL — ABNORMAL LOW (ref 4.22–5.81)
RDW: 15.5 % (ref 11.5–15.5)
WBC: 6.8 10*3/uL (ref 4.0–10.5)
nRBC: 0 % (ref 0.0–0.2)

## 2019-05-26 LAB — GLUCOSE, CAPILLARY
Glucose-Capillary: 166 mg/dL — ABNORMAL HIGH (ref 70–99)
Glucose-Capillary: 167 mg/dL — ABNORMAL HIGH (ref 70–99)
Glucose-Capillary: 214 mg/dL — ABNORMAL HIGH (ref 70–99)
Glucose-Capillary: 183 mg/dL — ABNORMAL HIGH (ref 70–99)

## 2019-05-26 MED ORDER — POTASSIUM CHLORIDE CRYS ER 20 MEQ PO TBCR
20.0000 meq | EXTENDED_RELEASE_TABLET | Freq: Two times a day (BID) | ORAL | Status: DC
  Administered 2019-05-26 – 2019-06-06 (×23): 20 meq via ORAL
  Filled 2019-05-26 (×23): qty 1

## 2019-05-26 MED ORDER — INSULIN GLARGINE 100 UNIT/ML ~~LOC~~ SOLN
14.0000 [IU] | Freq: Every day | SUBCUTANEOUS | Status: DC
  Administered 2019-05-26 – 2019-06-01 (×7): 14 [IU] via SUBCUTANEOUS
  Filled 2019-05-26 (×8): qty 0.14

## 2019-05-26 NOTE — Consult Note (Addendum)
[place] Nurse wound  Follow-up consult note Reason for Consult: Reassessment of bilat buttocks and sacrum pressure injuries Wound type: unstageable pressure injuries, all are 100% yellow slough Pressure Injury POA: No Measurement: Left buttock 1X.5cm Sacrum 2.5X.5cm Right buttock 5X3cm Drainage (amount, consistency, odor) small amt tan drainage, no odor Dressing procedure/placement/frequency: Pt has been receiving Santyl to provide enzymatic debridement of nonviable tissue.  PT to begin hydrotherapy, now that patient is no longer in isolation for Covid-19, to assist with removal of nonviable tissue. [place] team will re-assess weekly to determine if a change in the topical treatment is indicated at that time. Julien Girt MSN, RN, [place], [place], [place]

## 2019-05-26 NOTE — Progress Notes (Signed)
Physical Therapy Session Note  Patient Details  Name: Robert [name] MRN: [id number] Date of Birth: 07-Mar-1951  Today's Date: 05/26/2019 PT Individual Time: 1415-1520 PT Individual Time Calculation (min): 65 min   Short Term Goals: Week 1:  PT Short Term Goal 1 (Week 1): Pt will complete bed mobility consistently with CGA. PT Short Term Goal 2 (Week 1): Pt will complete bed<>w/c transfers with mod assist +1. PT Short Term Goal 3 (Week 1): Pt will ambulate 15 ft with LRAD & max assist +1. PT Short Term Goal 4 (Week 1): Pt will propel w/c x 50 ft with BUE & supervision.  Skilled Therapeutic Interventions/Progress Updates:   Pt resting in bed.  In flat bed, no rails, rolling L and sitting up with supervision.  Transfer training EOB, 5 x 1 squat/lift to elevate hips off of bed, mod assist.  Squat pivot with max assist to w/c.  Endurance activity, propelling w/c using bil UEs x 50' with supervision. Kinetron from w/c level at resistance 50 cm/sec, x 3 minutes.   Pre-gait/gait in parallell bars, scooting reciprocally to edge of seat, pulling up with bil hands, mod assist.  Pt tolerated standing and wt shifting L><R x 10; he had to sit suddenly due to fatigue.  Sit> stand 2nd bout with min assist, marching x 2 R/L.  Gait x 4' in parallel bars forward and backward, with min assistance for L knee stabilty.   Pt reported he needed to use toilet.  Toilet transfer using St[place] with min assist.  Pt managed clothes with supervision.  He had continent BM, no voiding.  Pt performed peri care in sitting, leaning R.  PT assisted for thoroughness.  Stedy transfer to return to bed.  PT suggested pt lie on his side whenever possible to avoid pressure on buttock wounds.  Alarm set and needs left at hand.     Therapy Documentation Precautions:  Precautions Precautions: Fall Precaution Comments: sacral wound, supplemental oxygen Required Braces or Orthoses: Other Brace Other Brace: B Prevalon  boots Restrictions Weight Bearing Restrictions: No  Pain: pt denied        Therapy/Group: Individual Therapy  Jada Fass 05/26/2019, 3:35 PM

## 2019-05-26 NOTE — Progress Notes (Signed)
Occupational Therapy Session Note  Patient Details  Name: Robert [name] MRN: [id number] Date of Birth: 25-Nov-1950  Today's Date: 05/26/2019 OT Individual Time: 1275-1700 OT Individual Time Calculation (min): 60 min  and Today's Date: 05/26/2019 OT Missed Time: 15 Minutes Missed Time Reason: Patient fatigue  Short Term Goals: Week 1:  OT Short Term Goal 1 (Week 1): Pt will complete BSC/toilet transfer with max A with LRAD OT Short Term Goal 2 (Week 1): Pt will thread BLE into pants with AE PRN and MIN A OT Short Term Goal 3 (Week 1): Pt will dof/don socks with AE and no VC to demo improved memory OT Short Term Goal 4 (Week 1): Pt will don shirt with S  Skilled Therapeutic Interventions/Progress Updates:     Pt greeted asleep in bed, easy to wake, but slow to get moving this morning. Pt agreeable to bathing/dressing at EOB. Pt came to sitting EOB with min A. UB bathing completed with set-up A and supervision for sitting balance. Stedy used for first sit<>stand with pt able to power up with min A. Pt was then able to remove unilateral UE from [facility] with encouragement from OT to wash his buttocks. Pt returned to sitting and OT assisted with donning clean pants 2.2 catheter. Pt agreeable to try to stand this time with RW. Pt requested gait belt donned prior to standing. Pt able to achieve sit<>stand with min A and RW. He then removed unilateral UE to help pull up pants. Pt took seated rest break, then agreeable to try to stand again at EOB. Min A sit<>stand w/ RW, then pt able to take side steps along EOB. Pt took another rest break, then practiced stepping forward and back with min A 3x, but did not feel safe and was too tired and fearful to take more steps. Pt reported max fatigue and requested to return to bed. Pt left semi-reclined in bed with bed alarm on and needs met. Pt missed 15 minutes of OT session 2/2 fatigue.   Therapy Documentation Precautions:  Precautions Precautions:  Fall Precaution Comments: sacral wound, supplemental oxygen Required Braces or Orthoses: Other Brace Other Brace: B Prevalon boots Restrictions Weight Bearing Restrictions: No General: General OT Amount of Missed Time: 15 Minutes Vital Signs: Therapy Vitals Temp: 98.6 F (37 C) Temp Source: Oral Pulse Rate: 69 Resp: 20 BP: (!) 112/58 Patient Position (if appropriate): Lying Oxygen Therapy SpO2: 96 % O2 Device: Nasal Cannula O2 Flow Rate (L/min): 2 L/min Pain:  denies pain   Therapy/Group: Individual Therapy  Valma Cava 05/26/2019, 9:04 AM

## 2019-05-26 NOTE — Progress Notes (Addendum)
[facility] PHYSICAL MEDICINE & REHABILITATION PROGRESS NOTE   Subjective/Complaints: No new complaints. Up with OT. Feels he's getting stronger  ROS: Patient denies fever, rash, sore throat, blurred vision, nausea, vomiting, diarrhea, cough, shortness of breath or chest pain, joint or back pain, headache, or mood change.    .    Objective:   No results found. Recent Labs    05/26/19 0727  WBC 6.8  HGB 8.5*  HCT 26.7*  PLT 242   Recent Labs    05/26/19 0727  NA 136  K 4.5  CL 97*  CO2 29  GLUCOSE 161*  BUN 25*  CREATININE 1.27*  CALCIUM 8.8*    Intake/Output Summary (Last 24 hours) at 05/26/2019 0923 Last data filed at 05/26/2019 0529 Gross per 24 hour  Intake 1062 ml  Output 3850 ml  Net -2788 ml     Physical Exam: Vital Signs Blood pressure (!) 112/58, pulse 69, temperature 98.6 F (37 C), temperature source Oral, resp. rate 20, height 6' (1.829 m), weight 95.7 kg, SpO2 96 %.  Constitutional: No distress . Vital signs reviewed. HEENT: EOMI, oral membranes moist Neck: supple Cardiovascular: RRR without murmur. No JVD    Respiratory: CTA Bilaterally without wheezes or rales. Normal effort    GI: BS +, non-tender, non-distended  Musculoskeletal:  General: No deformityor edema.  Comments: LLE with min edema and stasis changes on shin with dry flaky scab--stable. Neurological: He is alertand oriented to person, place, and time. Nocranial nerve deficit.functional memory and insight.  UE 3+ prox to 4+/5 distally. RLE: 2+ to 3-HF, 4-KE and 4/5 ADF/PF. LLE: 2+/5 HF, 2+ KE and 2+/5 ADF/PF.   Psychiatric: pleasant and calm Skin: multiple decubiti noted--largest on right buttock below--(9/14) sl improved from photo below at admit    Assessment/Plan: 1. Functional deficits secondary to debility which require 3+ hours per day of interdisciplinary therapy in a comprehensive inpatient rehab setting.  Physiatrist is providing close team supervision  and 24 hour management of active medical problems listed below.  Physiatrist and rehab team continue to assess barriers to discharge/monitor patient progress toward functional and medical goals  Care Tool:  Bathing    Body parts bathed by patient: Right arm, Left arm, Chest, Abdomen, Face   Body parts bathed by helper: Buttocks, Right upper leg, Left upper leg, Right lower leg, Left lower leg     Bathing assist Assist Level: Supervision/Verbal cueing(UB ONLY)     Upper Body Dressing/Undressing Upper body dressing   What is the patient wearing?: Pull over shirt    Upper body assist Assist Level: Supervision/Verbal cueing    Lower Body Dressing/Undressing Lower body dressing      What is the patient wearing?: Pants     Lower body assist Assist for lower body dressing: Moderate Assistance - Patient 50 - 74%     Toileting Toileting Toileting Activity did not occur (Clothing management and hygiene only): N/A (no void or bm)  Toileting assist Assist for toileting: Total Assistance - Patient < 25%     Transfers Chair/bed transfer  Transfers assist  Chair/bed transfer activity did not occur: Safety/medical concerns  Chair/bed transfer assist level: 2 Helpers     Locomotion Ambulation   Ambulation assist   Ambulation activity did not occur: Safety/medical concerns          Walk 10 feet activity   Assist  Walk 10 feet activity did not occur: Safety/medical concerns        Walk 50 feet  activity   Assist Walk 50 feet with 2 turns activity did not occur: Safety/medical concerns         Walk 150 feet activity   Assist Walk 150 feet activity did not occur: Safety/medical concerns         Walk 10 feet on uneven surface  activity   Assist Walk 10 feet on uneven surfaces activity did not occur: Safety/medical concerns         Wheelchair     Assist Will patient use wheelchair at discharge?: (TBD) Type of Wheelchair: Manual Wheelchair  activity did not occur: Refused         Wheelchair 50 feet with 2 turns activity    Assist    Wheelchair 50 feet with 2 turns activity did not occur: Refused       Wheelchair 150 feet activity     Assist  Wheelchair 150 feet activity did not occur: Refused       Blood pressure (!) 112/58, pulse 69, temperature 98.6 F (37 C), temperature source Oral, resp. rate 20, height 6' (1.829 m), weight 95.7 kg, SpO2 96 %.  Medical Problem List and Plan: 1.Functional and mobility deficitssecondary to debility after Covid respiratory failure ---Continue CIR therapies including PT, OT, and SLP  2.A fib/Antithrombotics: -DVT/anticoagulation:Pharmaceutical:Other (comment)--Eliquis. -antiplatelet therapy: On Plavix. 3. Pain Management:oxycodone prn. 4. Mood:LCSW to follow for evaluation and support. -antipsychotic agents: N/A 5. Neuropsych: This patientiscapable of making decisions on hisown behalf. 6.Sacral decub/Skin/Wound Care: -requested air mattress. -Continue Santyl with damp to dry dressings to slough on decubiti  -will request hydrotherapy evaluation today 7. Fluids/Electrolytes/Nutrition: encourage PO  -intake reasonable so far  -added protein supp for low albumin  -iron studies suggest anemia of chronic disease     8. CAD/ICM: Not surgical candidate. Treated medically with metoprolol, Plavix and lipitor. 9. Hx fluid overload: Monitor for signs of over load and check weights daily.Cont lasix 40mg  po daily    On chronic lasix with normal EF, monitor I/Os intake per pt is less than at home (home lasix  dose 80mg )  Filed Weights   05/24/19 0337 05/25/19 0446 05/26/19 0500  Weight: 102.3 kg 102.3 kg 95.7 kg   ---weight not accurate today -recheck tomorrow 10 Hypokalemia: k+ 4.5 today, continue kdur 11. T2DM: Hgb A1c- 7.7. Was on Toujeo, metformin and acarbose PTA. Acarbose added back on  9/08 with novolog for meal coverage.   -sugars labile, higher over last 24 hours  -hesitate to resume metformin given his borderline Cr  -increase lantus to 14u qhs CBG (last 3)  Recent Labs    05/25/19 1251 05/25/19 1724 05/25/19 2057  GLUCAP 220* 220* 209*    12. Anxiety disorder: Provide ego support. Continue Xanax prn. -requested neuropsych  -have scheduled 0.5mg  xanax at hs to help with sleep/breathing  -have reiewed relaxation strategies as well 13. RA bilateral knees/hips: Managed by Dr. Amil Amen. Stable off methotrexate/ Actemra at this time.  14. Urinary retention:     Continue flomax  -moving better -dc foley and begin voiding trial today 13. COPD: Now on Dulera BID with nebs prn,. wean oxygen as able but may have some longer term needs given his COVID ARDS  14. HTN:     Vitals:   05/26/19 0528 05/26/19 0847  BP: (!) 112/58   Pulse: 69   Resp: 20   Temp: 98.6 F (37 C)   SpO2: 95% 96%   -controlled 9/14 15. PAF: In NSR normal rate  on metoprolol and  Eliquis.   LOS: 4 days A FACE TO FACE EVALUATION WAS PERFORMED  Meredith Staggers 05/26/2019, 9:23 AM

## 2019-05-26 NOTE — Progress Notes (Signed)
Speech Language Pathology Daily Session Note  Patient Details  Name: Robert [name] MRN: [id number] Date of Birth: 08-01-1951  Today's Date: 05/26/2019 SLP Individual Time: [id number] SLP Individual Time Calculation (min): 45 min  Short Term Goals: Week 1: SLP Short Term Goal 1 (Week 1): Patient will demonstrate functional problem solving for complex tasks with Mod I. SLP Short Term Goal 2 (Week 1): Patient will recall new, daily information with use of external aids with Mod I. SLP Short Term Goal 3 (Week 1): Patient will consume current diet with minimal overt s/s of aspiration with overall supervision level verbal cues for use of swallowing compensatory strategies. SLP Short Term Goal 4 (Week 1): Patient will demonstrate efficient mastication with complete oral clearance of Dys. 3 textures without overt s/s of aspiration with supervision level verbal cues over 2 sessions prior to upgrade. SLP Short Term Goal 5 (Week 1): Patient will consume trials of thin liquids with minimal overt s/s of aspiration over 2 sessions to assess readiness for repeat MBS.  Skilled Therapeutic Interventions: Skilled treatment session focused on dysphagia and cognitive goals. SLP facilitated session by providing set-up assist with the suction toothbrush for oral care prior to trials. Patient consumed trials of ice chips without overt s/s of aspiration and thin liquids via cup with use of a chin tuck with overt cough X 1, suspect due to large bolus size. Suspect patient will be ready for repeat MBS later this week to assess possible upgrade. SLP also facilitated session by administering trials of Dys. 3 textures. Patient demonstrated efficient mastication with minimal oral residue and without overt s/s of aspiration. Recommend trial tray prior to upgrade. Patient recalled events from previous therapy sessions as well as pertinent information in regards to swallowing function with overall supervision level verbal cues.  Patient left upright in bed with alarm on and all needs within reach. Continue with current plan of care.      Pain Pain Assessment Pain Score: 0-No pain  Therapy/Group: Individual Therapy  Robert [name] 05/26/2019, 12:25 PM

## 2019-05-27 ENCOUNTER — Inpatient Hospital Stay (HOSPITAL_COMMUNITY): Payer: Medicare HMO | Admitting: Psychology

## 2019-05-27 ENCOUNTER — Inpatient Hospital Stay (HOSPITAL_COMMUNITY): Payer: Medicare HMO | Admitting: Physical Therapy

## 2019-05-27 ENCOUNTER — Inpatient Hospital Stay (HOSPITAL_COMMUNITY): Payer: Medicare HMO | Admitting: Occupational Therapy

## 2019-05-27 ENCOUNTER — Ambulatory Visit (HOSPITAL_COMMUNITY): Payer: Medicare HMO | Admitting: Speech Pathology

## 2019-05-27 LAB — GLUCOSE, CAPILLARY
Glucose-Capillary: 139 mg/dL — ABNORMAL HIGH (ref 70–99)
Glucose-Capillary: 175 mg/dL — ABNORMAL HIGH (ref 70–99)
Glucose-Capillary: 173 mg/dL — ABNORMAL HIGH (ref 70–99)
Glucose-Capillary: 203 mg/dL — ABNORMAL HIGH (ref 70–99)

## 2019-05-27 MED ORDER — GABAPENTIN 100 MG PO CAPS
200.0000 mg | ORAL_CAPSULE | Freq: Three times a day (TID) | ORAL | Status: DC
  Administered 2019-05-27 – 2019-06-07 (×32): 200 mg via ORAL
  Filled 2019-05-27 (×32): qty 2

## 2019-05-27 NOTE — Progress Notes (Signed)
Physical Therapy Session Note  Patient Details  Name: Robert [name] MRN: [id number] Date of Birth: 1951/06/03  Today's Date: 05/27/2019 PT Individual Time: [id number] PT Individual Time Calculation (min): 46 min   and  Today's Date: 05/27/2019 PT Missed Time: 14 Minutes Missed Time Reason: Nursing care  Short Term Goals: Week 1:  PT Short Term Goal 1 (Week 1): Pt will complete bed mobility consistently with CGA. PT Short Term Goal 2 (Week 1): Pt will complete bed<>w/c transfers with mod assist +1. PT Short Term Goal 3 (Week 1): Pt will ambulate 15 ft with LRAD & max assist +1. PT Short Term Goal 4 (Week 1): Pt will propel w/c x 50 ft with BUE & supervision.  Skilled Therapeutic Interventions/Progress Updates:    Pt received supine in bed with nursing staff present for in/out cath resulting in him missing 14 minutes of skilled physical therapy. Supine>sit, HOB partially elevated and using bedrails with supervision. Therapist donned B shoes max assist for time management sitting EOB with supervision. Sit<>stands using RW throughout session with min assist for lifting/lowering and pt demonstrating heavy reliance on B UE support - cuing for increased hip/knee extension. Stand pivot EOB>w/c using RW with min assist for lifting/balance.  Transported to/from gym in w/c for energy conservation. Ambulated 42ft, 91ft, and 50ft (seated break between each) all using RW with min assist for balance (+2 for w/c follow on 1st and 2nd walk, then no w/c follow 3rd walk) - demonstrates L foot drop during gait with toe drag in swing phase, pt reports ~2-26yr hx of chronic foot drop - donned posterior leaf spring AFO (needs a taller size) for 3rd walk with pt demonstrating improved foot clearance. After 1st walk SpO2 94% on RA and HR 78bpm.  Notified Pam, PA, on pt's need for L LE PRAFO at night and AFO for gait - educated pt on these as well. Stand pivot transfers EOM<>w/c using RW with min assist. Transported back to  room in w/c and pt left sitting in w/c with needs in reach and seat belt alarm on.  Therapy Documentation Precautions:  Precautions Precautions: Fall Precaution Comments: sacral wound, supplemental oxygen Required Braces or Orthoses: Other Brace Other Brace: B Prevalon boots Restrictions Weight Bearing Restrictions: No  Pain:   Denies pain during session.   Therapy/Group: Individual Therapy  Tawana Scale, PT, DPT 05/27/2019, 7:57 AM

## 2019-05-27 NOTE — Consult Note (Signed)
Neuropsychological Consultation   Patient:   Robert [name]   DOB:   Aug 29, 1951  MR Number:  [id number]  Location:  [place] [facility] [place] [id number] [place] [address] Dept: [place]: [phone]           Date of Service:   05/27/2019  Start Time:   2 PM End Time:   3 PM  Provider/Observer:  Ilean Skill, Psy.D.       Clinical Neuropsychologist       Billing Code/Service: [phone]  Chief Complaint:    Robert [name] is a 68 year old male with a history of CAD with ICM, peripheral arterial disease, SCI with lower left extremity weakness, diabetes, RA, anxiety disorder, COPD.  The patient was initially admitted on 04/12/2019 with severe shortness of breath, fever and cough.  He was intubated in the ED and found to be positive for COVID-19.  The patient was treated with Remdesivir, Actemra as well as steroids and broad-spectrum antibiotics for MRSA pneumonia.  The patient tolerated extubation on 05/03/2019 but had worsening of hypoxia with increased WOB, A. fib and RVR.  On 05/19/2019 he developed hypoxemia respiratory failure requiring reintubation and A. fib with RVR.  The patient was transferred to [facility] on 9/8 due to concerns of need of cardiac intervention.  Once his respiratory status stabilized he was cleared to start CIR.  Reason for Service:  The patient was referred for neuropsychological consultation due to coping and adjustment issues following a long hospital course.  Below is the HPI for the current admission.  [id number] Robert [name] is a 68 year old male with history of CAD with ICM, PAD, SCI with LLE weakness?, T2DM, RA- on Methotrexate/actemra, anxiety disorder, COPD who was originally admitted on 04/12/19 with severe SOB, fever and cough. He was intubated in ED and found to be positive for COVID 19 and was Remdesivir and Actemra as well as steroids and broad spectrum antibiotics for  MRSA PNA. [name] Hospital course significant for COPD exacerbation, septic shock due to MRSA PNA, AKI with electrolyte abnormality, acute urinary retention with hematuria--foley in place, as well as development of sacral decub. He tolerated extubation by 05/03/19 but had worsening of hypoxia with increase WOB, A fib with RVR and found to have rise in trop -1,269 due to demand ischemia from recurrent aspiration as well as CHF. He was started on IV heparin, treated with Unasyn X 5 days as well as IV lasix due to acute on chronic CHF.   He continued to require HF oxygen and CIR consulted due to patient's debilitated stage and has been following for medical stability. On 05/19/19, he developed hypoxemic respiratory failure requiring re-intubation and A fib with RVR. He was transferred to [facility] 09/08 due to concerns of need for cardiac intervention. He was treated with IV BB as well as IV diuresis with improvement and tolerated extubation later that day. As troponin trending down without signs of ACS, Cardiology recommended that CAD to be treated medically and he was transitioned to Eliquis 9/9. Beside swallow done and patient started on dysphagia 2, nectar liquids. Respiratory status stable and he was cleared to start CIR.   Current Status:  The patient reports that his mood is generally good and he is quite thankful that he was able to survive and he knows how severe his medical status was for the past month and a half.  The patient reports that he is feeling  stronger and is highly motivated to continue to work very hard through the therapies that he is doing to help him increase strength and physical stamina.  The patient denied any significant depression or anxiety symptoms.  The patient also denied any PTSD type symptoms at this time related to his hospital course and medical illness.  Behavioral Observation: Robert [name]  presents as a 67 y.o.-year-old Right Caucasian Male who appeared his stated age. his  dress was Appropriate and he was Well Groomed and his manners were Appropriate to the situation.  his participation was indicative of Appropriate and Redirectable behaviors.  There were any physical disabilities noted.  he displayed an appropriate level of cooperation and motivation.     Interactions:    Active Appropriate and Redirectable  Attention:   abnormal and attention span appeared shorter than expected for age  Memory:   within normal limits; recent and remote memory intact  Visuo-spatial:  not examined  Speech (Volume):  low  Speech:   normal; normal  Thought Process:  Coherent and Relevant  Though Content:  WNL; not suicidal and not homicidal  Orientation:   person, place, time/date and situation  Judgment:   Good  Planning:   Good  Affect:    Appropriate  Mood:    Euthymic  Insight:   Good  Intelligence:   normal  Medical History:   Past Medical History:  Diagnosis Date  . Anxiety   . COPD (chronic obstructive pulmonary disease) ([place])   . Coronary artery disease   . Full dentures   . GERD (gastroesophageal reflux disease)    Rolaids as needed  . High cholesterol   . History of MI (myocardial infarction)   . Hypertension    med. dosage increased 04/2016; has been on BP med. x 10 yrs.  . Incarcerated umbilical hernia 0000000  . Inguinal hernia 05/2016   bilateral   . Insulin dependent diabetes mellitus ([place])   . Ischemic cardiomyopathy    a. 03/2015 EF 40-45% by LV gram.  . Left leg cellulitis 10/08/2016  . Rheumatoid arthritis(714.0)        Psychiatric History:  The patient does have a history of anxiety but reports that his anxiety is generally well controlled although he has had some anxious moments and has been started on benzodiazepine.  The patient notes limitations of benzodiazepine and from a chronic standpoint but when he has had times of significant anxiety he has felt like his oxygen levels have fallen even though most the time his oxygen  levels are maintaining good status.  Family Med/Psych History:  Family History  Problem Relation Age of Onset  . Heart disease Father   . Colon cancer Brother        early 68s  . Hypertension Mother   . Heart disease Mother   . Colon cancer Mother        died age 48. late 37s early 37s at diagnosis.   . Lung cancer Mother      Impression/DX:  Robert [name] is a 68 year old male with a history of CAD with ICM, peripheral arterial disease, SCI with lower left extremity weakness, diabetes, RA, anxiety disorder, COPD.  The patient was initially admitted on 04/12/2019 with severe shortness of breath, fever and cough.  He was intubated in the ED and found to be positive for COVID-19.  The patient was treated with Remdesivir, Actemra as well as steroids and broad-spectrum antibiotics for MRSA pneumonia.  The patient tolerated extubation on  05/03/2019 but had worsening of hypoxia with increased WOB, A. fib and RVR.  On 05/19/2019 he developed hypoxemia respiratory failure requiring reintubation and A. fib with RVR.  The patient was transferred to [facility] on 9/8 due to concerns of need of cardiac intervention.  Once his respiratory status stabilized he was cleared to start CIR.  The patient reports that his mood is generally good and he is quite thankful that he was able to survive and he knows how severe his medical status was for the past month and a half.  The patient reports that he is feeling stronger and is highly motivated to continue to work very hard through the therapies that he is doing to help him increase strength and physical stamina.  The patient denied any significant depression or anxiety symptoms.  The patient also denied any PTSD type symptoms at this time related to his hospital course and medical illness.   Diagnosis:    Acute respiratory distress - Plan: DG Chest Port 1 [address] DG Chest Port 1 View  Follow up - Plan: DG Chest 2 View, DG Chest 2 View         Electronically  Signed   _______________________ Ilean Skill, Psy.D.

## 2019-05-27 NOTE — Progress Notes (Addendum)
[place] PHYSICAL MEDICINE & REHABILITATION PROGRESS NOTE   Subjective/Complaints: Unable to void. Affected his sleep. Breathing better  ROS: Patient denies fever, rash, sore throat, blurred vision, nausea, vomiting, diarrhea, cough, shortness of breath or chest pain, joint or back pain, headache, or mood change.     .    Objective:   No results found. Recent Labs    05/26/19 0727  WBC 6.8  HGB 8.5*  HCT 26.7*  PLT 242   Recent Labs    05/26/19 0727  NA 136  K 4.5  CL 97*  CO2 29  GLUCOSE 161*  BUN 25*  CREATININE 1.27*  CALCIUM 8.8*    Intake/Output Summary (Last 24 hours) at 05/27/2019 0953 Last data filed at 05/27/2019 0926 Gross per 24 hour  Intake 360 ml  Output 4000 ml  Net -3640 ml     Physical Exam: Vital Signs Blood pressure 121/70, pulse 67, temperature 98.6 F (37 C), temperature source Oral, resp. rate 15, height 6' (1.829 m), weight 95.7 kg, SpO2 96 %.  Constitutional: No distress . Vital signs reviewed. HEENT: EOMI, oral membranes moist Neck: supple Cardiovascular: RRR without murmur. No JVD    Respiratory: CTA Bilaterally without wheezes or rales. Normal effort    GI: BS +, non-tender, non-distended  Musculoskeletal:  General: No deformityor edema.  Comments: LLE with min edema and stasis changes on shin with dry flaky scab--stable. Neurological: He is alertand oriented to person, place, and time. Nocranial nerve deficit.functional memory and insight.  UE 3+ prox to 4+/5 distally. RLE: 2+ to 3-HF, 4-KE and 4/5 ADF/PF. LLE: 2+/5 HF, 2+ KE and 2+/5 ADF/PF.   Psychiatric: pleasant and calm Skin: multiple decubiti noted--largest on right buttock below--(9/15---essentially unchanged in appearance)      Assessment/Plan: 1. Functional deficits secondary to debility which require 3+ hours per day of interdisciplinary therapy in a comprehensive inpatient rehab setting.  Physiatrist is providing close team supervision and 24  hour management of active medical problems listed below.  Physiatrist and rehab team continue to assess barriers to discharge/monitor patient progress toward functional and medical goals  Care Tool:  Bathing    Body parts bathed by patient: Right arm, Left arm, Chest, Abdomen, Face   Body parts bathed by helper: Buttocks, Right upper leg, Left upper leg, Right lower leg, Left lower leg     Bathing assist Assist Level: Supervision/Verbal cueing(UB ONLY)     Upper Body Dressing/Undressing Upper body dressing   What is the patient wearing?: Pull over shirt    Upper body assist Assist Level: Supervision/Verbal cueing    Lower Body Dressing/Undressing Lower body dressing      What is the patient wearing?: Pants     Lower body assist Assist for lower body dressing: Moderate Assistance - Patient 50 - 74%     Toileting Toileting Toileting Activity did not occur Landscape architect and hygiene only): N/A (no void or bm)  Toileting assist Assist for toileting: Maximal Assistance - Patient 25 - 49%     Transfers Chair/bed transfer  Transfers assist  Chair/bed transfer activity did not occur: Safety/medical concerns  Chair/bed transfer assist level: 2 Helpers     Locomotion Ambulation   Ambulation assist   Ambulation activity did not occur: Safety/medical concerns          Walk 10 feet activity   Assist  Walk 10 feet activity did not occur: Safety/medical concerns        Walk 50 feet activity  Assist Walk 50 feet with 2 turns activity did not occur: Safety/medical concerns         Walk 150 feet activity   Assist Walk 150 feet activity did not occur: Safety/medical concerns         Walk 10 feet on uneven surface  activity   Assist Walk 10 feet on uneven surfaces activity did not occur: Safety/medical concerns         Wheelchair     Assist Will patient use wheelchair at discharge?: (TBD) Type of Wheelchair: Manual Wheelchair  activity did not occur: Refused         Wheelchair 50 feet with 2 turns activity    Assist    Wheelchair 50 feet with 2 turns activity did not occur: Refused       Wheelchair 150 feet activity     Assist  Wheelchair 150 feet activity did not occur: Refused       Blood pressure 121/70, pulse 67, temperature 98.6 F (37 C), temperature source Oral, resp. rate 15, height 6' (1.829 m), weight 95.7 kg, SpO2 96 %.  Medical Problem List and Plan: 1.Functional and mobility deficitssecondary to debility after Covid respiratory failure ---Continue CIR therapies including PT, OT, and SLP   -team conf today 2.A fib/Antithrombotics: -DVT/anticoagulation:Pharmaceutical:Other (comment)--Eliquis. -antiplatelet therapy: On Plavix. 3. Pain Management:oxycodone prn. 4. Mood:LCSW to follow for evaluation and support. -antipsychotic agents: N/A 5. Neuropsych: This patientiscapable of making decisions on hisown behalf. 6.Sacral decub/Skin/Wound Care: -requested air mattress. -Continue Santyl with damp to dry dressings to slough on decubiti  -  Hydrotherapy starting today  -discuss air mattress with team in conference 7. Fluids/Electrolytes/Nutrition: encourage PO  -intake reasonable so far  -continue protein supp for low albumin  -iron studies suggest anemia of chronic disease     8. CAD/ICM: Not surgical candidate. Treated medically with metoprolol, Plavix and lipitor. 9. Hx fluid overload: Monitor for signs of over load and check weights daily.Cont lasix 40mg  po daily    On chronic lasix with normal EF, monitor I/Os intake per pt is less than at home (home lasix  dose 80mg )  Filed Weights   05/24/19 0337 05/25/19 0446 05/26/19 0500  Weight: 102.3 kg 102.3 kg 95.7 kg   ---need weight today 9/15 10 Hypokalemia: k+ 4.5 today, continue kdur 11. T2DM: Hgb A1c- 7.7. Was on Toujeo, metformin and  acarbose PTA. Acarbose added back on 9/08 with novolog for meal coverage.    -hesitate to resume metformin given his borderline Cr  -increased lantus to 14u qhs 9/14  -sugars demonstrating some improvement CBG (last 3)  Recent Labs    05/26/19 1721 05/26/19 2130 05/27/19 0640  GLUCAP 214* 183* 139*    12. Anxiety disorder: Provide ego support. Continue Xanax prn. - neuropsych eval pending  -have scheduled 0.5mg  xanax at hs to help with sleep/breathing  -have reiewed relaxation strategies as well 13. RA bilateral knees/hips: Managed by Dr. Amil Amen. Stable off methotrexate/ Actemra at this time.  14. Urinary retention:     Continue flomax--0.8mg  qhs  Unable to void thus far  -no urecholine given his CAD -to toilet or BSC to void 13. COPD: Now on Dulera BID with nebs prn,. wean oxygen as able but may have some longer term needs given his COVID ARDS  14. HTN:     Vitals:   05/27/19 0220 05/27/19 0756  BP: 121/70   Pulse: 67   Resp: 15   Temp: 98.6 F (37 C)   SpO2: 96%  96%   -controlled 9/15 15. PAF: In NSR normal rate  on metoprolol and Eliquis.   LOS: 5 days A FACE TO FACE EVALUATION WAS PERFORMED  Robert [name] 05/27/2019, 9:53 AM

## 2019-05-27 NOTE — Progress Notes (Signed)
Social Work Patient ID: Robert [name], male   DOB: 1951-05-12, 68 y.o.   MRN: [id number]   Met with pt and daughter this afternoon to review team conference.  Both aware and agreeable with targeted d/c date of 10/1 and CGA goals overall.  Pt does ask, "why so long", however, he understands that he still needs to make a lot of gains to be ready for home.  He is brighter today and smiling/ joking a little with me and his daughter.  Neuropsychology to consult this afternoon as well.  Mehran Guderian, LCSW

## 2019-05-27 NOTE — Progress Notes (Signed)
Occupational Therapy Session Note  Patient Details  Name: Robert [name] MRN: [id number] Date of Birth: [birth year]  Today's Date: 05/27/2019 OT Individual Time: [id number] OT Individual Time Calculation (min): 45 min    Short Term Goals: Week 1:  OT Short Term Goal 1 (Week 1): Pt will complete BSC/toilet transfer with max A with LRAD OT Short Term Goal 2 (Week 1): Pt will thread BLE into pants with AE PRN and MIN A OT Short Term Goal 3 (Week 1): Pt will dof/don socks with AE and no VC to demo improved memory OT Short Term Goal 4 (Week 1): Pt will don shirt with S  Skilled Therapeutic Interventions/Progress Updates:    1:1 Self care retraining at sink level. Pt able to come the EOB with contact guard with extra time. Pt required frequent rest breaks. Pt able tolerate room air with sats 94%. Pt performed scoot pivot transfers with min A to the right bed to w/c. Pt performed upper body bathing with setup. Pt performed sit to stand from w/c with mod A. Contact guard to remain standing for balance. Pt was able to perform bathing periarea and buttocks with min A for balance and one UE for support. Pt with improved activity tolerance but still requires frequent breaks due to fatigue. Pt able to maintain swallowing precautions during the meal without cuing.   Left sitting up in the w/c.  Therapy Documentation Precautions:  Precautions Precautions: Fall Precaution Comments: sacral wound, supplemental oxygen Required Braces or Orthoses: Other Brace Other Brace: B Prevalon boots Restrictions Weight Bearing Restrictions: No Pain:  no c/o pain - reports difficulty with multiple cathing episodes last night   Therapy/Group: Individual Therapy  [name] [facility] 05/27/2019, 3:28 PM

## 2019-05-27 NOTE — Progress Notes (Signed)
Physical Therapy Wound Treatment Patient Details  Name: Robert [name] MRN: [id number] Date of Birth: 1950/12/09  Today's Date: 05/27/2019 PT Individual Time: 0930-1030 PT Individual Time Calculation (min): 60 min   Patient Active Problem List   Diagnosis Date Noted  . Physical debility 05/22/2019  . Demand ischemia ([place]) 05/20/2019  . Flash pulmonary edema ([place]) 05/20/2019  . Stage II decubitus ulcer ([place]) 05/18/2019  . Aspiration pneumonia of both lower lobes due to gastric secretions ([place]) 05/15/2019  . MRSA pneumonia ([place]) 05/15/2019  . Severe sepsis with septic shock ([place]) 05/15/2019  . NSTEMI (non-ST elevated myocardial infarction) ([place]) 05/15/2019  . Staphylococcal pneumonia ([place]) 04/20/2019  . HCAP (healthcare-associated pneumonia) 04/20/2019  . Pneumonia due to COVID-19 virus 04/18/2019  . COPD (chronic obstructive pulmonary disease) with emphysema ([place]) 04/18/2019  . AKI (acute kidney injury) ([place]) 04/18/2019  . Diabetes mellitus type 2, uncontrolled, with complications ([place]) 83/72/9021  . Pressure injury of skin 04/18/2019  . Atrial fibrillation with RVR ([place]) 04/15/2019  . COVID-19 virus detected 04/12/2019  . Acute respiratory distress syndrome (ARDS) due to COVID-19 virus 04/12/2019  . Hypoxia   . Sepsis ([place])   . PAD (peripheral artery disease) ([place]) 03/27/2019  . Pulmonary edema 08/27/2018  . Acute respiratory failure with hypoxemia ([place]) 08/27/2018  . Chronic combined systolic and diastolic heart failure ([place]) 08/27/2018  . Dyslipidemia (high LDL; low HDL) 03/29/2018  . Dysphagia 01/02/2018  . Diarrhea 01/02/2018  . Family history of colon cancer 01/02/2018  . Ischemic cardiomyopathy   . Insulin dependent diabetes mellitus ([place])   . Hypertension   . History of MI (myocardial infarction)   . GERD (gastroesophageal reflux disease)   . Full dentures   . Coronary artery disease   . Anxiety   . Acute ST elevation myocardial infarction (STEMI) of inferior wall  ([place]) 06/06/2017  . Varicose veins of left lower extremity with complications 11/55/2080  . Cellulitis of left leg without foot   . Right radial fracture 12/11/2016  . T3 vertebral fracture ([place]) 12/11/2016  . Fall 12/09/2016  . Thrombocytopenia ([place]) 10/09/2016  . Left leg cellulitis 10/08/2016  . Diabetes ([place]) 10/08/2016  . Inguinal hernia 05/12/2016  . Incarcerated umbilical hernia 22/33/6122  . Chronic combined systolic and diastolic CHF (congestive heart failure) ([place]) 04/25/2016  . High cholesterol   . Controlled type 2 diabetes mellitus with diabetic peripheral angiopathy without gangrene, with long-term current use of insulin ([place])   . Essential hypertension   . Non-STEMI (non-ST elevated myocardial infarction) ([place])   . CAD S/P percutaneous coronary angioplasty   . Unstable angina ([place]) 03/20/2015  . ICM-EF 35% at cath 02/01/15 02/02/2015  . DM type 2 causing vascular disease, not at goal [facility]) 05/29/2014  . Dyspnea 10/20/2013  . COPD (chronic obstructive pulmonary disease) ([place]) 09/03/2013  . Hyperlipidemia   . Old myocardial infarction 09/01/2013  . Abnormal nuclear stress test 05/17/2012  . S/P CFX PTCAI for ISR 02/01/15 05/17/2012  . PVD, chronic LLE 12/22/2011  . Rheumatoid arthritis ([place]) 12/22/2011  . Contrast media allergy 12/22/2011  . Tobacco abuse 12/21/2011   Past Medical History:  Diagnosis Date  . Anxiety   . COPD (chronic obstructive pulmonary disease) ([place])   . Coronary artery disease   . Full dentures   . GERD (gastroesophageal reflux disease)    Rolaids as needed  . High cholesterol   . History of MI (myocardial infarction)   . Hypertension    med. dosage increased 04/2016; has been  on BP med. x 10 yrs.  . Incarcerated umbilical hernia 53/9767  . Inguinal hernia 05/2016   bilateral   . Insulin dependent diabetes mellitus ([place])   . Ischemic cardiomyopathy    a. 03/2015 EF 40-45% by LV gram.  . Left leg cellulitis 10/08/2016  . Rheumatoid  arthritis(714.0)    Past Surgical History:  Procedure Laterality Date  . CARDIAC CATHETERIZATION N/A 02/01/2015   Procedure: Left Heart Cath and Coronary Angiography;  Surgeon: Belva Crome, MD;  Location: [place] CV LAB;  Service: Cardiovascular;  Laterality: N/A;  . CARDIAC CATHETERIZATION N/A 03/22/2015   Procedure: Left Heart Cath and Coronary Angiography;  Surgeon: Leonie Man, MD;  Location: [place] CV LAB;  Service: Cardiovascular;  Laterality: N/A;  . CARDIAC CATHETERIZATION  09/05/2002; 03/09/2003; 04/10/2005;06/02/2005; 05/09/2006; 02/09/2007; 03/01/2007  . COLONOSCOPY  2008   Dr. Gala Romney: diverticulosis, hyperplastic polyp.  . CORONARY ANGIOPLASTY  11/14/2012; 02/01/2015  . CORONARY ANGIOPLASTY WITH STENT PLACEMENT  05/16/2012   "1; makes total ~ 4"  . CORONARY STENT INTERVENTION N/A 06/06/2017   Procedure: CORONARY STENT INTERVENTION;  Surgeon: Belva Crome, MD;  Location: [place] CV LAB;  Service: Cardiovascular;  Laterality: N/A;  . INSERTION OF MESH Bilateral 05/24/2016   Procedure: INSERTION OF MESH;  Surgeon: Coralie Keens, MD;  Location: [place];  Service: General;  Laterality: Bilateral;  . LAPAROSCOPIC CHOLECYSTECTOMY    . LAPAROSCOPIC INGUINAL HERNIA WITH UMBILICAL HERNIA Bilateral 05/24/2016   Procedure: BILATERAL LAPAROSCOPIC INGUINAL HERNIA REPAIR WITH MESH AND UMBILICAL HERNIA REPAIR WITH MESH;  Surgeon: Coralie Keens, MD;  Location: [place];  Service: General;  Laterality: Bilateral;  . LEFT HEART CATH AND CORONARY ANGIOGRAPHY N/A 06/06/2017   Procedure: LEFT HEART CATH AND CORONARY ANGIOGRAPHY;  Surgeon: Belva Crome, MD;  Location: [place] CV LAB;  Service: Cardiovascular;  Laterality: N/A;  . LEFT HEART CATHETERIZATION WITH CORONARY ANGIOGRAM N/A 12/22/2011   Procedure: LEFT HEART CATHETERIZATION WITH CORONARY ANGIOGRAM;  Surgeon: Troy Sine, MD;  Location: [facility] CATH LAB;  Service: Cardiovascular;  Laterality: N/A;   . LEFT HEART CATHETERIZATION WITH CORONARY ANGIOGRAM Bilateral 05/16/2012   Procedure: LEFT HEART CATHETERIZATION WITH CORONARY ANGIOGRAM;  Surgeon: Lorretta Harp, MD;  Location: [facility] CATH LAB;  Service: Cardiovascular;  Laterality: Bilateral;  . LEFT HEART CATHETERIZATION WITH CORONARY ANGIOGRAM N/A 11/14/2012   Procedure: LEFT HEART CATHETERIZATION WITH CORONARY ANGIOGRAM;  Surgeon: Troy Sine, MD;  Location: [facility] CATH LAB;  Service: Cardiovascular;  Laterality: N/A;  . LEFT HEART CATHETERIZATION WITH CORONARY ANGIOGRAM N/A 09/01/2013   Procedure: LEFT HEART CATHETERIZATION WITH CORONARY ANGIOGRAM;  Surgeon: Leonie Man, MD;  Location: [facility] CATH LAB;  Service: Cardiovascular;  Laterality: N/A;  . [place]; 1973; 1985   x 3  . NM MYOCAR PERF WALL MOTION  08/30/2009   No significant ischemia  . OPEN REDUCTION INTERNAL FIXATION (ORIF) DISTAL RADIAL FRACTURE Right 12/10/2016   Procedure: OPEN REDUCTION INTERNAL FIXATION (ORIF) DISTAL RADIAL FRACTURE;  Surgeon: Iran Planas, MD;  Location: [place];  Service: Orthopedics;  Laterality: Right;  . PERCUTANEOUS CORONARY INTERVENTION-BALLOON ONLY  12/22/2011   Procedure: PERCUTANEOUS CORONARY INTERVENTION-BALLOON ONLY;  Surgeon: Troy Sine, MD;  Location[facility] C[facility] LAB;  Service: Cardiovascular;;  . PERCUTANEOUS CORONARY INTERVENTION-BALLOON ONLY  11/14/2012   Procedure: PERCUTANEOUS CORONARY INTERVENTION-BALLOON ONLY;  Surgeon: Troy Sine, MD;  Location: [facility] CATH LAB;  Service: Cardiovascular;;  . PERCUTANEOUS CORONARY STENT INTERVENTION (PCI-S)  05/16/2012  Procedure: PERCUTANEOUS CORONARY STENT INTERVENTION (PCI-S);  Surgeon: Lorretta Harp, MD;  Location: [facility] CATH LAB;  Service: Cardiovascular;;  . PERCUTANEOUS CORONARY STENT INTERVENTION (PCI-S)  09/01/2013   Procedure: PERCUTANEOUS CORONARY STENT INTERVENTION (PCI-S);  Surgeon: Leonie Man, MD;  Location: [facility] CATH LAB;  Service: Cardiovascular;;    Subjective  Subjective: Pt  pleasant and agreeable to hydrotherapy.  Patient and Family Stated Goals: Heal wound, keep it from getting worse Date of Onset: (Unknown) Prior Treatments: Santyl and wet>dry dressing changes  Pain Score: Pain Score: 0-No pain(pre med)  Objective  Cognition: WFL Communication: WFL   Wound Assessment  Pressure Injury 04/17/19 Sacrum Medial Unstageable - Full thickness tissue loss in which the base of the ulcer is covered by slough (yellow, tan, gray, green or brown) and/or eschar (tan, brown or black) in the wound bed. dark purple with some skin sloug (Active)  Wound Image   05/27/19 1132  Dressing Type ABD;Barrier Film (skin prep);Gauze (Comment);Moist to dry 05/27/19 1132  Dressing Clean;Dry;Intact;Changed 05/27/19 1132  Dressing Change Frequency Daily 05/27/19 1132  State of Healing Non-healing 05/27/19 1132  Site / Wound Assessment Yellow;Pink 05/27/19 1132  % Wound base Red or Granulating 5% 05/27/19 1132  % Wound base Yellow/Fibrinous Exudate 95% 05/27/19 1132  % Wound base Black/Eschar 0% 05/27/19 1132  Peri-wound Assessment Intact;Purple 05/27/19 1132  Wound Length (cm) 2 cm 05/27/19 1132  Wound Width (cm) 0.5 cm 05/27/19 1132  Wound Depth (cm) 0 cm 05/27/19 1132  Wound Surface Area (cm^2) 1 cm^2 05/27/19 1132  Wound Volume (cm^3) 0 cm^3 05/27/19 1132  Margins Unattached edges (unapproximated) 05/27/19 1132  Drainage Amount Minimal 05/27/19 1132  Drainage Description Purulent 05/27/19 1132  Treatment Debridement (Selective);Hydrotherapy (Pulse lavage);Packing (Saline gauze) 05/27/19 1132   Santyl applied to wound bed prior to applying dressing.    Pressure Injury 04/17/19 Buttocks Right;Lower Unstageable - Full thickness tissue loss in which the base of the ulcer is covered by slough (yellow, tan, gray, green or brown) and/or eschar (tan, brown or black) in the wound bed. (Active)  Wound Image Before debridement    After debridement 05/27/19 1132  Dressing Type ABD;Barrier  Film (skin prep);Gauze (Comment);Moist to dry 05/27/19 1132  Dressing Clean;Dry;Intact;Changed 05/27/19 1132  Dressing Change Frequency Daily 05/27/19 1132  State of Healing Eschar 05/27/19 1132  Site / Wound Assessment Brown;Yellow 05/27/19 1132  % Wound base Red or Granulating 0% 05/27/19 1132  % Wound base Yellow/Fibrinous Exudate 20% 05/27/19 1132  % Wound base Black/Eschar 80% 05/27/19 1132  % Wound base Other/Granulation Tissue (Comment) 0% 05/27/19 1132  Peri-wound Assessment Erythema (blanchable);Pink 05/27/19 1132  Wound Length (cm) 6 cm 05/27/19 1132  Wound Width (cm) 4 cm 05/27/19 1132  Wound Depth (cm) 0 cm 05/27/19 1132  Wound Surface Area (cm^2) 24 cm^2 05/27/19 1132  Wound Volume (cm^3) 0 cm^3 05/27/19 1132  Margins Unattached edges (unapproximated) 05/27/19 1132  Drainage Amount Minimal 05/27/19 1132  Drainage Description Purulent 05/27/19 1132  Treatment Debridement (Selective);Hydrotherapy (Pulse lavage);Packing (Saline gauze) 05/27/19 1132   Santyl applied to wound bed prior to applying dressing.    Pressure Injury 04/17/19 Buttocks Left;Lower Unstageable - Full thickness tissue loss in which the base of the ulcer is covered by slough (yellow, tan, gray, green or brown) and/or eschar (tan, brown or black) in the wound bed. WOC updated to Stage 3 on  (Active)  Wound Image   05/27/19 1132  Dressing Type ABD;Barrier Film (skin prep);Gauze (Comment);Moist to dry 05/27/19 1132  Dressing Changed;Clean;Dry;Intact 05/27/19 1132  Dressing Change Frequency Daily 05/27/19 1132  State of Healing Non-healing 05/27/19 1132  Site / Wound Assessment Yellow 05/27/19 1132  % Wound base Red or Granulating 5% 05/27/19 1132  % Wound base Yellow/Fibrinous Exudate 95% 05/27/19 1132  % Wound base Black/Eschar 0% 05/27/19 1132  Peri-wound Assessment Erythema (blanchable) 05/27/19 1132  Wound Length (cm) 1.5 cm 05/27/19 1132  Wound Width (cm) 0.5 cm 05/27/19 1132  Wound Depth (cm) 0 cm  05/27/19 1132  Wound Surface Area (cm^2) 0.75 cm^2 05/27/19 1132  Wound Volume (cm^3) 0 cm^3 05/27/19 1132  Margins Unattached edges (unapproximated) 05/27/19 1132  Drainage Amount Minimal 05/27/19 1132  Drainage Description Purulent 05/27/19 1132  Treatment Debridement (Selective);Hydrotherapy (Pulse lavage);Packing (Saline gauze) 05/27/19 1132   Santyl applied to wound bed prior to applying dressing.     Hydrotherapy Pulsed lavage therapy - wound location: bilateral buttock wounds and coccyx Pulsed Lavage with Suction (psi): 12 psi Pulsed Lavage with Suction - Normal Saline Used: 1000 mL Pulsed Lavage Tip: Tip with splash shield Selective Debridement Selective Debridement - Location: R buttock wound Selective Debridement - Tools Used: Forceps;Scalpel Selective Debridement - Tissue Removed: Owens Shark and yellow necrotic tissue   Wound Assessment and Plan  Wound Therapy - Assess/Plan/Recommendations Wound Therapy - Clinical Statement: Pt presents to hydrotherapy with bilateral buttock wounds and a central coccyx wound. Eschar removed from R buttock wound (see picture for "after" picture). Pt will benefit from continued hydrotherapy for continued selective removal of unviable tissue, to decrease bioburden, and promote wound bed healing.  Wound Therapy - Functional Problem List: Decreased tolerance for position changes due to skin breakdown, decreased mobility in general.  Factors Delaying/Impairing Wound Healing: Diabetes Mellitus;Immobility;Multiple medical problems Hydrotherapy Plan: Debridement;Dressing change;Patient/family education;Pulsatile lavage with suction Wound Therapy - Frequency: 6X / week Wound Therapy - Follow Up Recommendations: Home health RN Wound Plan: See above  Wound Therapy Goals- Improve the function of patient's integumentary system by progressing the wound(s) through the phases of wound healing (inflammation - proliferation - remodeling) by: Decrease Necrotic  Tissue to: 0 Decrease Necrotic Tissue - Progress: Goal set today Increase Granulation Tissue to: 100 Increase Granulation Tissue - Progress: Goal set today Goals/treatment plan/discharge plan were made with and agreed upon by patient/family: Yes Time For Goal Achievement: 7 days Wound Therapy - Potential for Goals: Good  Goals will be updated until maximal potential achieved or discharge criteria met.  Discharge criteria: when goals achieved, discharge from hospital, MD decision/surgical intervention, no progress towards goals, refusal/missing three consecutive treatments without notification or medical reason.  Thelma Comp 05/27/2019, 12:25 PM  Rolinda Roan, PT, DPT Acute Rehabilitation Services Pager: [phone] Office: [phone]

## 2019-05-27 NOTE — Progress Notes (Signed)
Orthopedic Tech Progress Note Patient Details:  Robert [name] [birth year] [id number]          [name] 05/27/2019, 2:56 PMCalled [facility] for left Prafo boot.

## 2019-05-27 NOTE — Progress Notes (Signed)
Speech Language Pathology Daily Session Note  Patient Details  Name: Robert [name] MRN: [id number] Date of Birth: [birth year]  Today's Date: 05/27/2019 SLP Individual Time: [id number] SLP Individual Time Calculation (min): 40 min  Short Term Goals: Week 1: SLP Short Term Goal 1 (Week 1): Patient will demonstrate functional problem solving for complex tasks with Mod I. SLP Short Term Goal 2 (Week 1): Patient will recall new, daily information with use of external aids with Mod I. SLP Short Term Goal 3 (Week 1): Patient will consume current diet with minimal overt s/s of aspiration with overall supervision level verbal cues for use of swallowing compensatory strategies. SLP Short Term Goal 4 (Week 1): Patient will demonstrate efficient mastication with complete oral clearance of Dys. 3 textures without overt s/s of aspiration with supervision level verbal cues over 2 sessions prior to upgrade. SLP Short Term Goal 5 (Week 1): Patient will consume trials of thin liquids with minimal overt s/s of aspiration over 2 sessions to assess readiness for repeat MBS.  Skilled Therapeutic Interventions: Skilled treatment session focused on dysphagia goals. SLP facilitated session by providing skilled observation with lunch meal of Dys. 3 textures with nectar-thick liquids. Patient demonstrated efficient mastication and minimal oral residue and independently utilized liquid washes. However, supervision verbal cues needed for use of small bites and a slow rate of self-feeding. Patient with overt cough X 1 at end of meal, suspect due to mixed consistencies (peach slices) despite utilizing a fork and attempting to let drain liquid. Recommend patient upgrade to Dys. 3 textures and continue nectar-thick liquids with full supervision. Patient's daughter present and educated in regards to patient's current swallowing function and goals of skilled SLP intervention. She verbalized understanding. Patient also educated on the  importance of eating meals OOB to maximize safety. Patient left upright in wheelchair with alarm on and all needs within reach. Continue with current plan of care.      Pain Pain Assessment Pain Scale: 0-10 Pain Score: 0-No pain(pre med)  Therapy/Group: Individual Therapy  Tamisha Nordstrom 05/27/2019, 12:56 PM

## 2019-05-27 NOTE — Progress Notes (Signed)
Called [employer] for left AFO brace.Orthopedic Tech Progress Note Patient Details:  Robert [name] June 06, 1951 [id number]  Patient ID: Robert [name], male   DOB: Aug 19, 1951, 68 y.o.   MRN: [id number]   [name] 05/27/2019, 12:14 PMCalled [employer] for AFO brace.

## 2019-05-27 NOTE — Patient Care Conference (Signed)
Inpatient RehabilitationTeam Conference and Plan of Care Update Date: 05/27/2019   Time: 10:00 AM    Patient Name: Robert [name]      Medical Record Number: [id number]  Date of Birth: [birth year] Sex: Male         Room/Bed: 4W18C/4W18C-01 Payor Info: Payor: [employer] MEDICARE / Plan: [place] HMO / Product Type: *No Product type* /    Admit Date/Time:  05/22/2019  6:54 PM  Primary Diagnosis:  Physical debility  Patient Active Problem List   Diagnosis Date Noted  . Physical debility 05/22/2019  . Demand ischemia ([place]) 05/20/2019  . Flash pulmonary edema ([place]) 05/20/2019  . Stage II decubitus ulcer ([place]) 05/18/2019  . Aspiration pneumonia of both lower lobes due to gastric secretions ([place]) 05/15/2019  . MRSA pneumonia ([place]) 05/15/2019  . Severe sepsis with septic shock ([place]) 05/15/2019  . NSTEMI (non-ST elevated myocardial infarction) ([place]) 05/15/2019  . Staphylococcal pneumonia ([place]) 04/20/2019  . HCAP (healthcare-associated pneumonia) 04/20/2019  . Pneumonia due to COVID-19 virus 04/18/2019  . COPD (chronic obstructive pulmonary disease) with emphysema ([place]) 04/18/2019  . AKI (acute kidney injury) ([place]) 04/18/2019  . Diabetes mellitus type 2, uncontrolled, with complications ([place]) [id number]  . Pressure injury of skin 04/18/2019  . Atrial fibrillation with RVR ([place]) 04/15/2019  . COVID-19 virus detected 04/12/2019  . Acute respiratory distress syndrome (ARDS) due to COVID-19 virus 04/12/2019  . Hypoxia   . Sepsis ([place])   . PAD (peripheral artery disease) ([place]) 03/27/2019  . Pulmonary edema 08/27/2018  . Acute respiratory failure with hypoxemia ([place]) 08/27/2018  . Chronic combined systolic and diastolic heart failure ([place]) 08/27/2018  . Dyslipidemia (high LDL; low HDL) 03/29/2018  . Dysphagia 01/02/2018  . Diarrhea 01/02/2018  . Family history of colon cancer 01/02/2018  . Ischemic cardiomyopathy   . Insulin dependent diabetes mellitus ([place])   . Hypertension   . History  of MI (myocardial infarction)   . GERD (gastroesophageal reflux disease)   . Full dentures   . Coronary artery disease   . Anxiety   . Acute ST elevation myocardial infarction (STEMI) of inferior wall ([place]) 06/06/2017  . Varicose veins of left lower extremity with complications [id number]  . Cellulitis of left leg without foot   . Right radial fracture 12/11/2016  . T3 vertebral fracture ([place]) 12/11/2016  . Fall 12/09/2016  . Thrombocytopenia ([place]) 10/09/2016  . Left leg cellulitis 10/08/2016  . Diabetes ([place]) 10/08/2016  . Inguinal hernia 05/12/2016  . Incarcerated umbilical hernia [id number]  . Chronic combined systolic and diastolic CHF (congestive heart failure) ([place]) 04/25/2016  . High cholesterol   . Controlled type 2 diabetes mellitus with diabetic peripheral angiopathy without gangrene, with long-term current use of insulin ([place])   . Essential hypertension   . Non-STEMI (non-ST elevated myocardial infarction) ([place])   . CAD S/P percutaneous coronary angioplasty   . Unstable angina ([place]) 03/20/2015  . ICM-EF 35% at cath 02/01/15 02/02/2015  . DM type 2 causing vascular disease, not at goal [facility]) 05/29/2014  . Dyspnea 10/20/2013  . COPD (chronic obstructive pulmonary disease) ([place]) 09/03/2013  . Hyperlipidemia   . Old myocardial infarction 09/01/2013  . Abnormal nuclear stress test 05/17/2012  . S/P CFX PTCAI for ISR 02/01/15 05/17/2012  . PVD, chronic LLE 12/22/2011  . Rheumatoid arthritis ([place]) 12/22/2011  . Contrast media allergy 12/22/2011  . Tobacco abuse 12/21/2011    Expected Discharge Date: Expected Discharge Date: 06/12/19  Team Members Present: Physician leading conference: Dr. Alger Simons  Social Worker Present: Lennart Pall, [place] Nurse Present: Isla Pence, RN PT Present: Donnamae Jude, PT OT Present: Laverle Hobby, OT SLP Present: Weston [name], SLP PPS Coordinator present : Gunnar Fusi, SLP     Current Status/Progress Goal Weekly Team Focus   Bowel/Bladder   Pt continent of bowel, LBM 9/14, need to collect hemo-occult. Pt had foley removed and has started voiding trial. Currently unable to void. I/O cath q3-4 while awake. Reported by day shift and noticed on current night shift, pt has cream-like discharge.  address current inability to void  assess toileting q shift and prn   Swallow/Nutrition/ Hydration   Dys. 2 textures with nectar-thick liquids, Supervision  Mod I  trials of upgraded textures and liquids and repeat MBS later this week   ADL's   mod A LB ADLs, (S) UB ADLs, can transfer with stedy or RW min A, limited by fatigue  CGA overall  functional activity tolerance, ADL retraining, ADL transfers   Mobility   S bed mobility, max assist squat pivot transfer, supervision w/c x 50', min assist gait x 4' in parallel bars  S bed, CGA basic and car tr, CGA gait x 100' iwht LRAD, CGA 3 steps bil rails, S w/c x 100'  activity tolerance, strengthening, pre-gait/gait training   Communication             Safety/Cognition/ Behavioral Observations  Supervision  Mod I  complex problem solving and recall   Pain   pt has had no c/o pain  remain pain free and address any new onset pain  assess pain q shift and prn   Skin   Abrasion on Left tibial w/ foam. Rash on groin, treated w/ nystatin. 3 Pressure sores (Large sore on right buttocks, small sore on left buttocks, small sore on sacrum) new wound care and hydrotherapy orders.   address current skin issues and prevent further breakdown  assess skin q shift and prn    Rehab Goals Patient on target to meet rehab goals: Yes *See Care Plan and progress notes for long and short-term goals.     Barriers to Discharge  Current Status/Progress Possible Resolutions Date Resolved   Nursing                  PT                    OT Decreased caregiver support;Lack of/limited family support;Incontinence;New oxygen                SLP                SW                Discharge  Planning/Teaching Needs:  Pt to d/c home with adult children covering 24/7 support.  Teaching to be planned closer to d/c.   Team Discussion:  COVID debility;  lg sacral wounds with hydrotherapy starting today.  MD would like air mattress ordered as well.  BSs labile.  Foley out yesterday but poor emptying so far.  Weaning down O2.  S - Mod assist with ADLs and CGA goals.  amb ~4' in // bars with goals @ 100' @ CGA.  Fear and anxiety are limiting pt at times - neuropsych to see today.  Revisions to Treatment Plan:  NA    Medical Summary Current Status: COVID respiratory failure and debility. large pressure wounds. urine retention, CAD, oxygen dependent, weaning. anxiety at times Weekly Focus/Goal: wound,  bladder, anxiety, nutrition, fatigue, swallow  Barriers to Discharge: Medical stability;Wound care   Possible Resolutions to Barriers: continue per medical problem list   Continued Need for Acute Rehabilitation Level of Care: The patient requires daily medical management by a physician with specialized training in physical medicine and rehabilitation for the following reasons: Direction of a multidisciplinary physical rehabilitation program to maximize functional independence : Yes Medical management of patient stability for increased activity during participation in an intensive rehabilitation regime.: Yes Analysis of laboratory values and/or radiology reports with any subsequent need for medication adjustment and/or medical intervention. : Yes   I attest that I was present, lead the team conference, and concur with the assessment and plan of the team.   Lennart Pall 05/27/2019, 2:29 PM    Team conference was held via web/ teleconference due to [place] - 19

## 2019-05-27 NOTE — Progress Notes (Signed)
Late entry, O2 has been off since 1st therapy and checked at 11:30, SAT was 95 to 96% RA.

## 2019-05-27 NOTE — Progress Notes (Signed)
Physical Therapy Session Note  Patient Details  Name: Robert [name] MRN: [id number] Date of Birth: 1951-05-14  Today's Date: 05/27/2019 PT Individual Time: 1515-1600 PT Individual Time Calculation (min): 45 min   Short Term Goals: Week 1:  PT Short Term Goal 1 (Week 1): Pt will complete bed mobility consistently with CGA. PT Short Term Goal 2 (Week 1): Pt will complete bed<>w/c transfers with mod assist +1. PT Short Term Goal 3 (Week 1): Pt will ambulate 15 ft with LRAD & max assist +1. PT Short Term Goal 4 (Week 1): Pt will propel w/c x 50 ft with BUE & supervision.  Skilled Therapeutic Interventions/Progress Updates:    Pt supine in bed upon PT arrival, agreeable to therapy tx and denies pain. Pt on room air throughout session. Pt transferred to sitting with supervision and cues for techniques. Therapist assisted with donning shoes and L AFO, stand pivot to w/c with mod assist for boost up to standing and min assist for stand pivot transfer with RW. Pt transported to the gym in w/c, orthotist present this session to determine need for L AFO. Pt ambulated x 20 ft and x 22 ft with RW and min assist, w/c follow for pt comfort, SpO2 92% following activity. Pt worked on LE strength and balance to perform standing marches in place 2 x 10 per LE, and x 10 mini squats with UE support. Pt worked on standing balance to perform horseshoe toss activity with CGA, single UE support on RW for balance. Pt performed multiple sit<>stands throughout session from w/c requiring mod assist for boost up to standing. Pt transported back to room, ambulated x 10 ft to bed with RW and min assist. Pt transferred to sidelying for pressure relief, left with needs in reach and bed alarm set.   Therapy Documentation Precautions:  Precautions Precautions: Fall Precaution Comments: sacral wound, supplemental oxygen Required Braces or Orthoses: Other Brace Other Brace: B Prevalon boots Restrictions Weight Bearing  Restrictions: No    Therapy/Group: Individual Therapy  Netta Corrigan, PT, DPT 05/27/2019, 12:52 PM

## 2019-05-28 ENCOUNTER — Inpatient Hospital Stay (HOSPITAL_COMMUNITY): Payer: Medicare HMO | Admitting: Speech Pathology

## 2019-05-28 ENCOUNTER — Inpatient Hospital Stay (HOSPITAL_COMMUNITY): Payer: Medicare HMO | Admitting: Occupational Therapy

## 2019-05-28 ENCOUNTER — Inpatient Hospital Stay (HOSPITAL_COMMUNITY): Payer: Medicare HMO

## 2019-05-28 DIAGNOSIS — R339 Retention of urine, unspecified: Secondary | ICD-10-CM

## 2019-05-28 DIAGNOSIS — E1165 Type 2 diabetes mellitus with hyperglycemia: Secondary | ICD-10-CM

## 2019-05-28 DIAGNOSIS — J449 Chronic obstructive pulmonary disease, unspecified: Secondary | ICD-10-CM

## 2019-05-28 DIAGNOSIS — F064 Anxiety disorder due to known physiological condition: Secondary | ICD-10-CM

## 2019-05-28 DIAGNOSIS — E876 Hypokalemia: Secondary | ICD-10-CM

## 2019-05-28 DIAGNOSIS — R7309 Other abnormal glucose: Secondary | ICD-10-CM

## 2019-05-28 DIAGNOSIS — Z794 Long term (current) use of insulin: Secondary | ICD-10-CM

## 2019-05-28 LAB — GLUCOSE, CAPILLARY
Glucose-Capillary: 133 mg/dL — ABNORMAL HIGH (ref 70–99)
Glucose-Capillary: 165 mg/dL — ABNORMAL HIGH (ref 70–99)
Glucose-Capillary: 144 mg/dL — ABNORMAL HIGH (ref 70–99)
Glucose-Capillary: 240 mg/dL — ABNORMAL HIGH (ref 70–99)

## 2019-05-28 MED ORDER — LIDOCAINE HCL URETHRAL/MUCOSAL 2 % EX GEL
1.0000 "application " | Freq: Once | CUTANEOUS | Status: AC
  Administered 2019-05-28: 1
  Filled 2019-05-28: qty 5

## 2019-05-28 NOTE — Progress Notes (Addendum)
[facility] PHYSICAL MEDICINE & REHABILITATION PROGRESS NOTE   Subjective/Complaints: Patient seen laying in bed this morning.  He states he slept well overnight.  He asks about a different mattress.  Discussed with nursing, attempting to obtain air loss mattress.  ROS: Denies CP, SOB, N/V/D  Objective:   No results found. Recent Labs    05/26/19 0727  WBC 6.8  HGB 8.5*  HCT 26.7*  PLT 242   Recent Labs    05/26/19 0727  NA 136  K 4.5  CL 97*  CO2 29  GLUCOSE 161*  BUN 25*  CREATININE 1.27*  CALCIUM 8.8*    Intake/Output Summary (Last 24 hours) at 05/28/2019 1135 Last data filed at 05/28/2019 1028 Gross per 24 hour  Intake 760 ml  Output 3900 ml  Net -3140 ml     Physical Exam: Vital Signs Blood pressure 120/66, pulse 85, temperature 98 F (36.7 C), temperature source Oral, resp. rate 14, height 6' (1.829 m), weight 102 kg, SpO2 90 %. Constitutional: No distress . Vital signs reviewed. HENT: Normocephalic.  Atraumatic. Eyes: EOMI. No discharge. Cardiovascular: No JVD. Respiratory: Normal effort.  No stridor. GI: Mildly distended. Skin: Decubitus ulcer not examined today Psych: Normal mood.  Normal behavior. Musc: No edema.  No tenderness. Neurological: Alert and oriented Motor: Bilateral upper extremities: 4/5 proximal distal Right lower extremity: 3/5 hip flexion, 4/5 knee extension, 4/5 ankle dorsiflexion Left lower extremity: 3/5 hip flexion, knee extension, ankle dorsiflexion  Assessment/Plan: 1. Functional deficits secondary to debility which require 3+ hours per day of interdisciplinary therapy in a comprehensive inpatient rehab setting.  Physiatrist is providing close team supervision and 24 hour management of active medical problems listed below.  Physiatrist and rehab team continue to assess barriers to discharge/monitor patient progress toward functional and medical goals  Care Tool:  Bathing    Body parts bathed by patient: Right arm, Left  arm, Chest, Abdomen, Face   Body parts bathed by helper: Buttocks, Right upper leg, Left upper leg, Right lower leg, Left lower leg     Bathing assist Assist Level: Supervision/Verbal cueing(UB ONLY)     Upper Body Dressing/Undressing Upper body dressing   What is the patient wearing?: Pull over shirt    Upper body assist Assist Level: Supervision/Verbal cueing    Lower Body Dressing/Undressing Lower body dressing      What is the patient wearing?: Pants     Lower body assist Assist for lower body dressing: Moderate Assistance - Patient 50 - 74%     Toileting Toileting Toileting Activity did not occur Landscape architect and hygiene only): N/A (no void or bm)  Toileting assist Assist for toileting: Maximal Assistance - Patient 25 - 49%     Transfers Chair/bed transfer  Transfers assist  Chair/bed transfer activity did not occur: Safety/medical concerns  Chair/bed transfer assist level: Minimal Assistance - Patient > 75%     Locomotion Ambulation   Ambulation assist   Ambulation activity did not occur: Safety/medical concerns  Assist level: Minimal Assistance - Patient > 75% Assistive device: Walker-rolling Max distance: 10 ft   Walk 10 feet activity   Assist  Walk 10 feet activity did not occur: Safety/medical concerns  Assist level: Minimal Assistance - Patient > 75% Assistive device: Walker-rolling   Walk 50 feet activity   Assist Walk 50 feet with 2 turns activity did not occur: Safety/medical concerns         Walk 150 feet activity   Assist Walk 150 feet  activity did not occur: Safety/medical concerns         Walk 10 feet on uneven surface  activity   Assist Walk 10 feet on uneven surfaces activity did not occur: Safety/medical concerns         Wheelchair     Assist Will patient use wheelchair at discharge?: (TBD) Type of Wheelchair: Manual Wheelchair activity did not occur: Refused         Wheelchair 50 feet  with 2 turns activity    Assist    Wheelchair 50 feet with 2 turns activity did not occur: Refused       Wheelchair 150 feet activity     Assist  Wheelchair 150 feet activity did not occur: Refused       Blood pressure 120/66, pulse 85, temperature 98 F (36.7 C), temperature source Oral, resp. rate 14, height 6' (1.829 m), weight 102 kg, SpO2 90 %.  Medical Problem List and Plan: 1.Functional and mobility deficitssecondary to debility after Covid respiratory failure  Continue CIR 2.A fib/Antithrombotics: -DVT/anticoagulation:Pharmaceutical:Other (comment)--Eliquis. -antiplatelet therapy: On Plavix. 3. Pain Management:oxycodone prn. 4. Mood:LCSW to follow for evaluation and support. -antipsychotic agents: N/A 5. Neuropsych: This patientiscapable of making decisions on hisown behalf. 6.Sacral decub/Skin/Wound Care: -Awaiting air mattress. -Continue Santyl with damp to dry dressings to slough on decubiti  - Hydrotherapy 7. Fluids/Electrolytes/Nutrition: encourage PO  -intake reasonable so far  -continue protein supp for low albumin  -iron studies suggest anemia of chronic disease 8. CAD/ICM: Not surgical candidate. Treated medically with metoprolol, Plavix and lipitor. 9. Hx fluid overload: Monitor for signs of over load and check weights daily.Cont lasix 40mg  po daily    On chronic lasix with normal EF, monitor I/Os intake per pt is less than at home (home lasix  dose 80mg )  Filed Weights   05/25/19 0446 05/26/19 0500 05/28/19 0536  Weight: 102.3 kg 95.7 kg 102 kg    ?  Stable on 9/16 10 Hypokalemia:  Potassium 4.5 on 9/14  Continue supplement 11. T2DM with hyperglycemia: Hgb A1c- 7.7. Was on Toujeo, metformin and acarbose PTA. Acarbose added back on 9/08 with novolog for meal coverage.    -hesitate to resume metformin given his borderline Cr  -increased lantus to 14u qhs 9/14  Labile on  9/16, will consider further adjustments CBG (last 3)  Recent Labs    05/27/19 1641 05/27/19 2113 05/28/19 0617  GLUCAP 173* 203* 133*    12. Anxiety disorder: Provide ego support. Continue Xanax prn.  -scheduled 0.5mg  xanax at hs to help with sleep/breathing  ?  Improving 13. RA bilateral knees/hips: Managed by Dr. Amil Amen. Stable off methotrexate/ Actemra at this time.  14. Urinary retention:     Continue flomax--0.8mg  qhs  Unable to void thus far  -no urecholine given his CAD -to toilet or BSC to void  Continue I/O caths, Urojet added 13. COPD: Now on Dulera BID with nebs prn  ?  Supplemental oxygen weaned 14. HTN:     Vitals:   05/28/19 0536 05/28/19 0754  BP: 120/66   Pulse: 71 85  Resp: 16 14  Temp: 98 F (36.7 C)   SpO2: 94% 90%   Controlled on 9/16 15. PAF: In NSR normal rate  on metoprolol and Eliquis.   LOS: 6 days A FACE TO FACE EVALUATION WAS PERFORMED  Jackson Coffield [name] 05/28/2019, 11:35 AM

## 2019-05-28 NOTE — Progress Notes (Signed)
Physical Therapy Wound Treatment Patient Details  Name: Robert [name] MRN: [id number] Date of Birth: [birth year]  Today's Date: 05/28/2019 PT Individual Time: 0930-1000 PT Individual Time Calculation (min): 30 min   Subjective  Subjective: Pt pleasant and agreeable to hydrotherapy.  Patient and Family Stated Goals: Heal wound, keep it from getting worse Date of Onset: (Unknown) Prior Treatments: Santyl and wet>dry dressing changes  Pain Score:  Pt reports minimal discomfort throughout treatment session.   Wound Assessment  Pressure Injury 04/17/19 Sacrum Medial Unstageable - Full thickness tissue loss in which the base of the ulcer is covered by slough (yellow, tan, gray, green or brown) and/or eschar (tan, brown or black) in the wound bed. dark purple with some skin sloug (Active)  Dressing Type ABD;Barrier Film (skin prep);Gauze (Comment);Moist to dry 05/28/19 1231  Dressing Clean;Dry;Intact;Changed 05/28/19 1231  Dressing Change Frequency Daily 05/28/19 1231  State of Healing Non-healing 05/28/19 1231  Site / Wound Assessment Yellow;Pink 05/28/19 1231  % Wound base Red or Granulating 5% 05/28/19 1231  % Wound base Yellow/Fibrinous Exudate 95% 05/28/19 1231  % Wound base Black/Eschar 0% 05/28/19 1231  Peri-wound Assessment Intact;Purple 05/28/19 1231  Wound Length (cm) 2 cm 05/27/19 1132  Wound Width (cm) 0.5 cm 05/27/19 1132  Wound Depth (cm) 0 cm 05/27/19 1132  Wound Surface Area (cm^2) 1 cm^2 05/27/19 1132  Wound Volume (cm^3) 0 cm^3 05/27/19 1132  Margins Unattached edges (unapproximated) 05/28/19 1231  Drainage Amount Minimal 05/28/19 1231  Drainage Description Purulent 05/28/19 1231  Treatment Hydrotherapy (Pulse lavage);Packing (Saline gauze) 05/28/19 1231   Santyl applied to wound bed prior to applying dressing.    Pressure Injury 04/17/19 Buttocks Right;Lower Unstageable - Full thickness tissue loss in which the base of the ulcer is covered by slough (yellow, tan, gray,  green or brown) and/or eschar (tan, brown or black) in the wound bed. (Active)  Dressing Type ABD;Barrier Film (skin prep);Gauze (Comment);Moist to dry 05/28/19 1231  Dressing Clean;Dry;Intact;Changed 05/28/19 1231  Dressing Change Frequency Daily 05/28/19 1231  State of Healing Eschar 05/28/19 1231  Site / Wound Assessment Brown;Yellow 05/28/19 1231  % Wound base Red or Granulating 0% 05/28/19 1231  % Wound base Yellow/Fibrinous Exudate 80% 05/28/19 1231  % Wound base Black/Eschar 20% 05/28/19 1231  % Wound base Other/Granulation Tissue (Comment) 0% 05/28/19 1231  Peri-wound Assessment Erythema (blanchable);Pink 05/28/19 1231  Wound Length (cm) 6 cm 05/27/19 1132  Wound Width (cm) 4 cm 05/27/19 1132  Wound Depth (cm) 0 cm 05/27/19 1132  Wound Surface Area (cm^2) 24 cm^2 05/27/19 1132  Wound Volume (cm^3) 0 cm^3 05/27/19 1132  Margins Unattached edges (unapproximated) 05/28/19 1231  Drainage Amount Minimal 05/28/19 1231  Drainage Description Purulent 05/28/19 1231  Treatment Debridement (Selective);Hydrotherapy (Pulse lavage);Packing (Saline gauze) 05/28/19 1231   Santyl applied to wound bed prior to applying dressing.    Pressure Injury 04/17/19 Buttocks Left;Lower Unstageable - Full thickness tissue loss in which the base of the ulcer is covered by slough (yellow, tan, gray, green or brown) and/or eschar (tan, brown or black) in the wound bed. WOC updated to Stage 3 on  (Active)  Dressing Type ABD;Barrier Film (skin prep);Gauze (Comment);Moist to dry 05/28/19 1231  Dressing Changed;Clean;Dry;Intact 05/28/19 1231  Dressing Change Frequency Daily 05/28/19 1231  State of Healing Non-healing 05/28/19 1231  Site / Wound Assessment Yellow 05/28/19 1231  % Wound base Red or Granulating 5% 05/28/19 1231  % Wound base Yellow/Fibrinous Exudate 95% 05/28/19 1231  % Wound base Black/Eschar 0%  05/28/19 1231  Peri-wound Assessment Erythema (blanchable) 05/28/19 1231  Wound Length (cm) 1.5 cm  05/27/19 1132  Wound Width (cm) 0.5 cm 05/27/19 1132  Wound Depth (cm) 0 cm 05/27/19 1132  Wound Surface Area (cm^2) 0.75 cm^2 05/27/19 1132  Wound Volume (cm^3) 0 cm^3 05/27/19 1132  Margins Unattached edges (unapproximated) 05/28/19 1231  Drainage Amount Minimal 05/28/19 1231  Drainage Description Purulent 05/28/19 1231  Treatment Hydrotherapy (Pulse lavage);Packing (Saline gauze) 05/28/19 1231   Santyl applied to wound bed prior to applying dressing.     Hydrotherapy Pulsed lavage therapy - wound location: bilateral buttock wounds and coccyx Pulsed Lavage with Suction (psi): 12 psi Pulsed Lavage with Suction - Normal Saline Used: 1000 mL Pulsed Lavage Tip: Tip with splash shield Selective Debridement Selective Debridement - Location: R buttock wound Selective Debridement - Tools Used: Forceps;Scalpel Selective Debridement - Tissue Removed: Owens Shark and yellow necrotic tissue   Wound Assessment and Plan  Wound Therapy - Assess/Plan/Recommendations Wound Therapy - Clinical Statement: Progressing well with removal of necrotic tissue on R buttock wound. Pt will benefit from continued hydrotherapy for continued selective removal of unviable tissue, to decrease bioburden, and promote wound bed healing.  Wound Therapy - Functional Problem List: Decreased tolerance for position changes due to skin breakdown, decreased mobility in general.  Factors Delaying/Impairing Wound Healing: Diabetes Mellitus;Immobility;Multiple medical problems Hydrotherapy Plan: Debridement;Dressing change;Patient/family education;Pulsatile lavage with suction Wound Therapy - Frequency: 6X / week Wound Therapy - Follow Up Recommendations: Home health RN Wound Plan: See above  Wound Therapy Goals- Improve the function of patient's integumentary system by progressing the wound(s) through the phases of wound healing (inflammation - proliferation - remodeling) by: Decrease Necrotic Tissue to: 0 Decrease Necrotic Tissue  - Progress: Progressing toward goal Increase Granulation Tissue to: 100 Increase Granulation Tissue - Progress: Progressing toward goal Goals/treatment plan/discharge plan were made with and agreed upon by patient/family: Yes Time For Goal Achievement: 7 days Wound Therapy - Potential for Goals: Good  Goals will be updated until maximal potential achieved or discharge criteria met.  Discharge criteria: when goals achieved, discharge from hospital, MD decision/surgical intervention, no progress towards goals, refusal/missing three consecutive treatments without notification or medical reason.  GP     Thelma Comp 05/28/2019, 12:39 PM   Rolinda Roan, PT, DPT Acute Rehabilitation Services Pager: [phone] Office: [phone]

## 2019-05-28 NOTE — Progress Notes (Signed)
Speech Language Pathology Daily Session Note  Patient Details  Name: Robert [name] MRN: [id number] Date of Birth: 05-09-1951  Today's Date: 05/28/2019 SLP Individual Time: [id number] SLP Individual Time Calculation (min): 55 min  Short Term Goals: Week 1: SLP Short Term Goal 1 (Week 1): Patient will demonstrate functional problem solving for complex tasks with Mod I. SLP Short Term Goal 2 (Week 1): Patient will recall new, daily information with use of external aids with Mod I. SLP Short Term Goal 3 (Week 1): Patient will consume current diet with minimal overt s/s of aspiration with overall supervision level verbal cues for use of swallowing compensatory strategies. SLP Short Term Goal 4 (Week 1): Patient will demonstrate efficient mastication with complete oral clearance of Dys. 3 textures without overt s/s of aspiration with supervision level verbal cues over 2 sessions prior to upgrade. SLP Short Term Goal 5 (Week 1): Patient will consume trials of thin liquids with minimal overt s/s of aspiration over 2 sessions to assess readiness for repeat MBS.  Skilled Therapeutic Interventions: Skilled treatment session focused on cognitive and dysphagia goals. SLP facilitated session by providing trials of thin liquids via cup with use of a chin tuck. Patient demonstrated subtle and intermittent overt s/s of aspiration, therefore, repeat MBS scheduled for tomorrow to assess swallow function and possible upgrade. Patient also independently recalled all of his current medications and their functions and verbalized his current medication management system that appears will be appropriate at discharge. Patient left supine in bed with alarm on and all needs within reach. Continue with current plan of care.      Pain No/Denies Pain  Therapy/Group: Individual Therapy  Josephina Melcher 05/28/2019, 12:48 PM

## 2019-05-28 NOTE — Progress Notes (Signed)
Patient's bladder scan volume was 521ml. He was catheterized, but it was painful for him. Nurse tech states that each time is a little worse. Communication put in for the physician for the use of a coude cath with lidocaine. Cath volume 841ml. No acute distress noted. Will continue to monitor

## 2019-05-28 NOTE — Progress Notes (Signed)
Occupational Therapy Session Note  Patient Details  Name: TRACI KINGSBURY MRN: [id number] Date of Birth: 11-May-1951  Today's Date: 05/28/2019 OT Individual Time: 1345-1445 OT Individual Time Calculation (min): 60 min    Short Term Goals: Week 1:  OT Short Term Goal 1 (Week 1): Pt will complete BSC/toilet transfer with max A with LRAD OT Short Term Goal 2 (Week 1): Pt will thread BLE into pants with AE PRN and MIN A OT Short Term Goal 3 (Week 1): Pt will dof/don socks with AE and no VC to demo improved memory OT Short Term Goal 4 (Week 1): Pt will don shirt with S Week 2:     Skilled Therapeutic Interventions/Progress Updates:    1:1 self care retraining at shower level. Pt ambulated from EOB to toilet with BSC over it with min A with AFO donned. Pt then transitioned over to shower with RW without AFO with min A. Pt showered with A for back, buttocks in standing and LEs. Pt able to perform sit to stand and standing balance with min A. Pt requires extra time for all tasks. Pt able to tolerate RA with sats >97% throughout session. PT donned clothes with AE with min A and returned to bed for dressing change on his buttocks. Pt requested to be scanned and cathed after trying to void on commode and in shower.   Therapy Documentation Precautions:  Precautions Precautions: Fall Precaution Comments: sacral wound, supplemental oxygen Required Braces or Orthoses: Other Brace Other Brace: B Prevalon boots Restrictions Weight Bearing Restrictions: No General:   Vital Signs: Therapy Vitals Temp: 98.1 F (36.7 C) Temp Source: Oral Pulse Rate: 67 Resp: 17 BP: 118/75 Oxygen Therapy SpO2: 98 % O2 Device: Room Air Pain:  no c/o pain    Therapy/Group: Individual Therapy  [name] [facility] 05/28/2019, 4:11 PM

## 2019-05-28 NOTE — Progress Notes (Signed)
Physical Therapy Session Note  Patient Details  Name: Robert [name] MRN: [id number] Date of Birth: 01-Nov-1950  Today's Date: 05/28/2019 PT Individual Time: 1030-1130 PT Individual Time Calculation (min): 60 min   Short Term Goals: Week 1:  PT Short Term Goal 1 (Week 1): Pt will complete bed mobility consistently with CGA. PT Short Term Goal 2 (Week 1): Pt will complete bed<>w/c transfers with mod assist +1. PT Short Term Goal 3 (Week 1): Pt will ambulate 15 ft with LRAD & max assist +1. PT Short Term Goal 4 (Week 1): Pt will propel w/c x 50 ft with BUE & supervision.     Skilled Therapeutic Interventions/Progress Updates:  Pt resting in bed.  Supine> sit with supervision.  He denied pain..  Sit> stand from raised bed with CGA, to RW.  Stand pivot to w/c with min assist.  PT instructed pt in slef stretching R/L hamstrings and heel cords in sitting, using foot stool, x 30 seconds x 3.  Pt reported long -term tightness bil LEs, exacerbated by cervical spine fx 2018.   Discussed ht of pt's bed, favorite chair and car seat; PT recommended that he have family measure these.  He may benefit from using an extra firm cushion in his favorite chair to raise seat ht; pt voiced understanding.    Sit> stand from w/c took 3 attempts, instuction in forwad wt shift and placement of L hand on front bar of RW.  Min>mod assist.  Gait training iwht RW, LAFO x 50' x 2 with CGA.  Up/down 1 step bil rails with mod assist.  Attempted 2nd step, but pt's L knee unstable for R foot to step up.  At end of session, pt seated in w/c.  PT discussed falls precautions with pt; he stated that he would call for assistance and not get up by himself.  Safety plan updated.    Therapy Documentation Precautions:  Precautions Precautions: Fall Precaution Comments: sacral wound, supplemental oxygen Required Braces or Orthoses: Other Brace Other Brace: B Prevalon boots Restrictions Weight Bearing Restrictions: No        Therapy/Group: Individual Therapy  Bernhard Koskinen 05/28/2019, 12:27 PM

## 2019-05-29 ENCOUNTER — Inpatient Hospital Stay (HOSPITAL_COMMUNITY): Payer: Medicare HMO | Admitting: Occupational Therapy

## 2019-05-29 ENCOUNTER — Inpatient Hospital Stay (HOSPITAL_COMMUNITY): Payer: Medicare HMO

## 2019-05-29 ENCOUNTER — Encounter (HOSPITAL_COMMUNITY): Payer: Medicare HMO | Admitting: Speech Pathology

## 2019-05-29 LAB — GLUCOSE, CAPILLARY
Glucose-Capillary: 192 mg/dL — ABNORMAL HIGH (ref 70–99)
Glucose-Capillary: 221 mg/dL — ABNORMAL HIGH (ref 70–99)
Glucose-Capillary: 185 mg/dL — ABNORMAL HIGH (ref 70–99)
Glucose-Capillary: 204 mg/dL — ABNORMAL HIGH (ref 70–99)

## 2019-05-29 LAB — OCCULT BLOOD X 1 CARD TO LAB, STOOL: Fecal Occult Bld: NEGATIVE

## 2019-05-29 MED ORDER — GLIMEPIRIDE 2 MG PO TABS
1.0000 mg | ORAL_TABLET | Freq: Every day | ORAL | Status: DC
  Administered 2019-05-29 – 2019-06-02 (×5): 1 mg via ORAL
  Filled 2019-05-29 (×5): qty 1

## 2019-05-29 NOTE — Progress Notes (Signed)
Speech Language Pathology Weekly Progress Note  Patient Details  Name: Robert [name] MRN: [id number] Date of Birth: 28-Apr-1951  Beginning of progress report period: May 23, 2019 End of progress report period: May 29, 2019  Short Term Goals: Week 1: SLP Short Term Goal 1 (Week 1): Patient will demonstrate functional problem solving for complex tasks with Mod I. SLP Short Term Goal 1 - Progress (Week 1): Met SLP Short Term Goal 2 (Week 1): Patient will recall new, daily information with use of external aids with Mod I. SLP Short Term Goal 2 - Progress (Week 1): Met SLP Short Term Goal 3 (Week 1): Patient will consume current diet with minimal overt s/s of aspiration with overall supervision level verbal cues for use of swallowing compensatory strategies. SLP Short Term Goal 3 - Progress (Week 1): Met SLP Short Term Goal 4 (Week 1): Patient will demonstrate efficient mastication with complete oral clearance of Dys. 3 textures without overt s/s of aspiration with supervision level verbal cues over 2 sessions prior to upgrade. SLP Short Term Goal 4 - Progress (Week 1): Met SLP Short Term Goal 5 (Week 1): Patient will consume trials of thin liquids with minimal overt s/s of aspiration over 2 sessions to assess readiness for repeat MBS. SLP Short Term Goal 5 - Progress (Week 1): Met    New Short Term Goals: Week 2: SLP Short Term Goal 1 (Week 2): Patient will consume current diet with minimal overt s/s of aspiration with Mod I for use of swallowing compensatory strategies. SLP Short Term Goal 2 (Week 2): Patient will demonstrate efficient mastication and complete oral clearance with trials of regular textures over 2 sessions with Mod I for use of swallowing compensatory strategies and without overt s/s of aspiration. SLP Short Term Goal 3 (Week 2): Patient will consume trials of thin liquids via cup with use of a chin tuck without overt s/s of aspiration with suprvision verbal cues over  3 sessions to assess readiness for repeat MBS. SLP Short Term Goal 4 (Week 2): Patient will complete pharyngeal strengthening exercies with supervision verbal cues.  Weekly Progress Updates: Patient has made excellent gains and has met 5 of 5 STGs this reporting period. Currently, patient is consuming Dys. 3 textures with nectar-thick liquids with minimal overt s/s of aspiration with full supervision for use of swallowing compensatory strategies. Patient had repeat MBS today and continues to demonstrate deep penetration and intermittent sensed aspiration of larger boluses of thin liquids. Although patient was unable to upgrade to thin liquids, the water protocol was initiated. Patient also demonstrates improved cognitive functioning and is overall Mod I for complex problem solving and recall of functional information. Patient and family education ongoing. Patient would benefit from continued skilled SLP intervention to maximize his swallowing function prior to discharge.       Intensity: Minumum of 1-2 x/day, 30 to 90 minutes Frequency: 3 to 5 out of 7 days Duration/Length of Stay: 06/12/19 Treatment/Interventions: Dysphagia/aspiration precaution training;Internal/external aids;Environmental Environmental consultant;Therapeutic Activities;Therapeutic Exercise;Patient/family education;Functional tasks   Robert [name], [place] 05/29/2019, 3:55 PM

## 2019-05-29 NOTE — Progress Notes (Signed)
Modified Barium Swallow Progress Note  Patient Details  Name: Robert [name] MRN: [id number] Date of Birth: 1950/10/13  Today's Date: 05/29/2019  Modified Barium Swallow completed.  Full report located under Chart Review in the Imaging Section.  Brief recommendations include the following:  Clinical Impression  Patient demonstrates minimally improved swallow function compared to his previous MBS. Patient continues to demonstrate moderate pharyngeal dysphagia due to decreased glottic closure and impaired pharyngeal strength. These deficits in result in intermittent, deep penetration of thin liquids via cup despite use of compensatory strategies and intermittent sensed aspiration with larger boluses of thin.  Although the chin tuck was ineffective in eliminating deep penetration of thin, it was effective in decreased vallecular residue with solid textures and reduced amount of penetrates with nectar-thick liquids. Recommend patient continue current diet of Dys. 3 textures with nectar-thick liquids but initiate use of a chin tuck to help minimize pharyngeal residue. Also recommend patient initiate the water protocol. Educated patient in regards to results and recommendations, he verbalized understanding.   Swallow Evaluation Recommendations       SLP Diet Recommendations: Nectar thick liquid;Dysphagia 3 (Mech soft) solids, Water Protocol   Liquid Administration via: Cup   Medication Administration: Whole meds with puree   Supervision: Patient able to self feed;Full supervision/cueing for compensatory strategies   Compensations: Slow rate;Small sips/bites;Follow solids with liquid;Minimize environmental distractions;Clear throat intermittently;Multiple dry swallows after each bite/sip;Chin tuck       Oral Care Recommendations: Oral care BID   Other Recommendations: Order thickener from pharmacy;Prohibited food (jello, ice cream, thin soups);Remove water pitcher;Have oral suction  available;Clarify dietary restrictions    [name] 05/29/2019,3:50 PM

## 2019-05-29 NOTE — Progress Notes (Signed)
[employer] PHYSICAL MEDICINE & REHABILITATION PROGRESS NOTE   Subjective/Complaints: sidelying in bed. Says he slept well. Hydrotherapy "not too bad". Feels that he's getting stronger  ROS: Patient denies fever, rash, sore throat, blurred vision, nausea, vomiting, diarrhea, cough, shortness of breath or chest pain, joint or back pain, headache, or mood change.    Objective:   No results found. No results for input(s): WBC, HGB, HCT, PLT in the last 72 hours. No results for input(s): NA, K, CL, CO2, GLUCOSE, BUN, CREATININE, CALCIUM in the last 72 hours.  Intake/Output Summary (Last 24 hours) at 05/29/2019 0953 Last data filed at 05/29/2019 0830 Gross per 24 hour  Intake 1450 ml  Output 4425 ml  Net -2975 ml     Physical Exam: Vital Signs Blood pressure 125/73, pulse 68, temperature 97.8 F (36.6 C), temperature source Oral, resp. rate 18, height 6' (1.829 m), weight 95.9 kg, SpO2 97 %. Constitutional: No distress . Vital signs reviewed. HEENT: EOMI, oral membranes moist Neck: supple. Voice stronger Cardiovascular: RRR without murmur. No JVD    Respiratory: CTA Bilaterally without wheezes or rales. Normal effort    GI: BS +, non-tender, non-distended  Skin: Decubitus ulcer with significant amount of debrided tissues. Remains about 90% fibronecrotic, depth 1+cm Psych: Normal mood.  Normal behavior. Musc: No edema.  No tenderness. Neurological: Alert and oriented Motor: Bilateral upper extremities: 4/5 proximal distal Right lower extremity: 3/5 hip flexion, 4/5 knee extension, 4/5 ankle dorsiflexion Left lower extremity: 3/5 hip flexion, knee extension, ankle dorsiflexion  Assessment/Plan: 1. Functional deficits secondary to debility which require 3+ hours per day of interdisciplinary therapy in a comprehensive inpatient rehab setting.  Physiatrist is providing close team supervision and 24 hour management of active medical problems listed below.  Physiatrist and rehab team  continue to assess barriers to discharge/monitor patient progress toward functional and medical goals  Care Tool:  Bathing    Body parts bathed by patient: Right arm, Left arm, Chest, Abdomen, Face   Body parts bathed by helper: Buttocks, Right upper leg, Left upper leg, Right lower leg, Left lower leg     Bathing assist Assist Level: Supervision/Verbal cueing(UB ONLY)     Upper Body Dressing/Undressing Upper body dressing   What is the patient wearing?: Pull over shirt    Upper body assist Assist Level: Supervision/Verbal cueing    Lower Body Dressing/Undressing Lower body dressing      What is the patient wearing?: Pants     Lower body assist Assist for lower body dressing: Moderate Assistance - Patient 50 - 74%     Toileting Toileting Toileting Activity did not occur Landscape architect and hygiene only): N/A (no void or bm)  Toileting assist Assist for toileting: Maximal Assistance - Patient 25 - 49%     Transfers Chair/bed transfer  Transfers assist  Chair/bed transfer activity did not occur: Safety/medical concerns  Chair/bed transfer assist level: Minimal Assistance - Patient > 75%     Locomotion Ambulation   Ambulation assist   Ambulation activity did not occur: Safety/medical concerns  Assist level: Minimal Assistance - Patient > 75% Assistive device: Walker-rolling Max distance: 10 ft   Walk 10 feet activity   Assist  Walk 10 feet activity did not occur: Safety/medical concerns  Assist level: Minimal Assistance - Patient > 75% Assistive device: Walker-rolling   Walk 50 feet activity   Assist Walk 50 feet with 2 turns activity did not occur: Safety/medical concerns  Walk 150 feet activity   Assist Walk 150 feet activity did not occur: Safety/medical concerns         Walk 10 feet on uneven surface  activity   Assist Walk 10 feet on uneven surfaces activity did not occur: Safety/medical concerns          Wheelchair     Assist Will patient use wheelchair at discharge?: (TBD) Type of Wheelchair: Manual Wheelchair activity did not occur: Refused         Wheelchair 50 feet with 2 turns activity    Assist    Wheelchair 50 feet with 2 turns activity did not occur: Refused       Wheelchair 150 feet activity     Assist  Wheelchair 150 feet activity did not occur: Refused       Blood pressure 125/73, pulse 68, temperature 97.8 F (36.6 C), temperature source Oral, resp. rate 18, height 6' (1.829 m), weight 95.9 kg, SpO2 97 %.  Medical Problem List and Plan: 1.Functional and mobility deficitssecondary to debility after Covid respiratory failure  Continue CIR PT, OT, SLP 2.A fib/Antithrombotics: -DVT/anticoagulation:Pharmaceutical:Other (comment)--Eliquis. -antiplatelet therapy: On Plavix. 3. Pain Management:oxycodone prn. 4. Mood:LCSW to follow for evaluation and support. -antipsychotic agents: N/A 5. Neuropsych: This patientiscapable of making decisions on hisown behalf. 6.Sacral decub/Skin/Wound Care: -  air mattress. -Continue Santyl with damp to dry dressings to slough on decubiti  - Hydrotherapy efforts appreciated! 7. Fluids/Electrolytes/Nutrition: encourage PO  -intake remains good  -continue protein supp for low albumin  -iron studies suggest anemia of chronic disease 8. CAD/ICM: Not surgical candidate. Treated medically with metoprolol, Plavix and lipitor. 9. Hx fluid overload: Monitor for signs of over load and check weights daily.Cont lasix 40mg  po daily    On chronic lasix with normal EF, monitor I/Os intake per pt is less than at home (home lasix  dose 80mg )  Filed Weights   05/26/19 0500 05/28/19 0536 05/29/19 0408  Weight: 95.7 kg 102 kg 95.9 kg     Stable on 9/17 10 Hypokalemia:  Potassium 4.5 on 9/14  Continue supplement 11. T2DM with hyperglycemia: Hgb A1c- 7.7. Was on  Toujeo, metformin and acarbose PTA. Acarbose added back on 9/08 with novolog for meal coverage.   -less than ideal glucose control   -hesitate to resume metformin given his borderline Cr  -will add amaryl 1mg  daily  -continue lantus 14u qhs 9/14--consider weaning if adjustments in orals successful   CBG (last 3)  Recent Labs    05/28/19 1701 05/28/19 2113 05/29/19 0633  GLUCAP 144* 240* 192*    12. Anxiety disorder: Provide ego support. Continue Xanax prn.  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 Amil Amen. Stable off methotrexate/ Actemra at this time.  14. Urinary retention:     Continue flomax--0.8mg  qhs  Remains unable to void thus far  -no urecholine given his CAD -to toilet or BSC to void  Continue I/O caths, Urojet added for comfort 13. COPD: Now on Dulera BID with nebs prn  ?  Supplemental oxygen weaned 14. HTN:     Vitals:   05/29/19 0415 05/29/19 0803  BP: 125/73   Pulse: 68   Resp: 18   Temp: 97.8 F (36.6 C)   SpO2: 98% 97%   Controlled on 9/17 15. PAF: In NSR normal rate  on metoprolol and Eliquis.   LOS: 7 days A FACE TO FACE EVALUATION WAS PERFORMED  Meredith Staggers 05/29/2019, 9:53 AM

## 2019-05-29 NOTE — Progress Notes (Signed)
Physical Therapy Wound Treatment Patient Details  Name: Robert [name] MRN: [id number] Date of Birth: [birth year]  Today's Date: 05/29/2019 PT Individual Time: 1330-1400 PT Individual Time Calculation (min): 30 min   Subjective  Subjective: Pt pleasant and agreeable to hydrotherapy. Daughter present for session today. Patient and Family Stated Goals: Heal wound, keep it from getting worse Date of Onset: (Unknown) Prior Treatments: Santyl and wet>dry dressing changes  Pain Score:  Pt tolerated well without premedication. Some tenderness reported during pulsed-lavage and debridement on inferolateral aspect of R buttock wound.   Wound Assessment  Pressure Injury 04/17/19 Sacrum Medial Unstageable - Full thickness tissue loss in which the base of the ulcer is covered by slough (yellow, tan, gray, green or brown) and/or eschar (tan, brown or black) in the wound bed. dark purple with some skin sloug (Active)  Dressing Type ABD;Barrier Film (skin prep);Gauze (Comment);Moist to dry 05/29/19 1408  Dressing Clean;Dry;Intact;Changed 05/29/19 1408  Dressing Change Frequency Daily 05/29/19 1408  State of Healing Non-healing 05/29/19 1408  Site / Wound Assessment Yellow;Pink 05/29/19 1408  % Wound base Red or Granulating 5% 05/29/19 1408  % Wound base Yellow/Fibrinous Exudate 95% 05/29/19 1408  % Wound base Black/Eschar 0% 05/29/19 1408  Peri-wound Assessment Intact;Purple 05/29/19 1408  Wound Length (cm) 2 cm 05/27/19 1132  Wound Width (cm) 0.5 cm 05/27/19 1132  Wound Depth (cm) 0 cm 05/27/19 1132  Wound Surface Area (cm^2) 1 cm^2 05/27/19 1132  Wound Volume (cm^3) 0 cm^3 05/27/19 1132  Margins Unattached edges (unapproximated) 05/29/19 1408  Drainage Amount Minimal 05/29/19 1408  Drainage Description Serosanguineous 05/29/19 1408  Treatment Hydrotherapy (Pulse lavage);Packing (Saline gauze) 05/29/19 1408   Santyl applied to wound bed prior to applying dressing.    Pressure Injury 04/17/19  Buttocks Right;Lower Unstageable - Full thickness tissue loss in which the base of the ulcer is covered by slough (yellow, tan, gray, green or brown) and/or eschar (tan, brown or black) in the wound bed. (Active)  Dressing Type ABD;Barrier Film (skin prep);Gauze (Comment);Moist to dry 05/29/19 1408  Dressing Clean;Dry;Intact;Changed 05/29/19 1408  Dressing Change Frequency Daily 05/29/19 1408  State of Healing Eschar 05/29/19 1408  Site / Wound Assessment Brown;Yellow 05/29/19 1408  % Wound base Red or Granulating 10% 05/29/19 1408  % Wound base Yellow/Fibrinous Exudate 80% 05/29/19 1408  % Wound base Black/Eschar 10% 05/29/19 1408  % Wound base Other/Granulation Tissue (Comment) 0% 05/29/19 1408  Peri-wound Assessment Erythema (blanchable);Pink 05/29/19 1408  Wound Length (cm) 6 cm 05/27/19 1132  Wound Width (cm) 4 cm 05/27/19 1132  Wound Depth (cm) 0 cm 05/27/19 1132  Wound Surface Area (cm^2) 24 cm^2 05/27/19 1132  Wound Volume (cm^3) 0 cm^3 05/27/19 1132  Margins Unattached edges (unapproximated) 05/29/19 1408  Drainage Amount Minimal 05/29/19 1408  Drainage Description Serosanguineous 05/29/19 1408  Treatment Hydrotherapy (Pulse lavage);Packing (Saline gauze);Debridement (Selective) 05/29/19 1408   Santyl applied to wound bed prior to applying dressing.    Pressure Injury 04/17/19 Buttocks Left;Lower Unstageable - Full thickness tissue loss in which the base of the ulcer is covered by slough (yellow, tan, gray, green or brown) and/or eschar (tan, brown or black) in the wound bed. WOC updated to Stage 3 on  (Active)  Dressing Type ABD;Barrier Film (skin prep);Gauze (Comment);Moist to dry 05/29/19 1408  Dressing Changed;Clean;Dry;Intact 05/29/19 1408  Dressing Change Frequency Daily 05/29/19 1408  State of Healing Non-healing 05/29/19 1408  Site / Wound Assessment Yellow 05/29/19 1408  % Wound base Red or Granulating 5% 05/29/19  1408  % Wound base Yellow/Fibrinous Exudate 95% 05/29/19  1408  % Wound base Black/Eschar 0% 05/29/19 1408  Peri-wound Assessment Erythema (blanchable) 05/29/19 1408  Wound Length (cm) 1.5 cm 05/27/19 1132  Wound Width (cm) 0.5 cm 05/27/19 1132  Wound Depth (cm) 0 cm 05/27/19 1132  Wound Surface Area (cm^2) 0.75 cm^2 05/27/19 1132  Wound Volume (cm^3) 0 cm^3 05/27/19 1132  Margins Unattached edges (unapproximated) 05/29/19 1408  Drainage Amount Minimal 05/29/19 1408  Drainage Description Serosanguineous 05/29/19 1408  Treatment Hydrotherapy (Pulse lavage);Packing (Saline gauze) 05/29/19 1408   Santyl applied to wound bed prior to applying dressing.     Hydrotherapy Pulsed lavage therapy - wound location: bilateral buttock wounds and coccyx Pulsed Lavage with Suction (psi): 12 psi Pulsed Lavage with Suction - Normal Saline Used: 1000 mL Pulsed Lavage Tip: Tip with splash shield Selective Debridement Selective Debridement - Location: R buttock wound Selective Debridement - Tools Used: Forceps;Scalpel Selective Debridement - Tissue Removed: Owens Shark and yellow necrotic tissue   Wound Assessment and Plan  Wound Therapy - Assess/Plan/Recommendations Wound Therapy - Clinical Statement: Progressing well with removal of necrotic tissue on R buttock wound. Pt's daughter present for session and questions answered. Pt will benefit from continued hydrotherapy for continued selective removal of unviable tissue, to decrease bioburden, and promote wound bed healing.  Wound Therapy - Functional Problem List: Decreased tolerance for position changes due to skin breakdown, decreased mobility in general.  Factors Delaying/Impairing Wound Healing: Diabetes Mellitus;Immobility;Multiple medical problems Hydrotherapy Plan: Debridement;Dressing change;Patient/family education;Pulsatile lavage with suction Wound Therapy - Frequency: 6X / week Wound Therapy - Follow Up Recommendations: Home health RN Wound Plan: See above  Wound Therapy Goals- Improve the function  of patient's integumentary system by progressing the wound(s) through the phases of wound healing (inflammation - proliferation - remodeling) by: Decrease Necrotic Tissue to: 0 Decrease Necrotic Tissue - Progress: Progressing toward goal Increase Granulation Tissue to: 100 Increase Granulation Tissue - Progress: Progressing toward goal Goals/treatment plan/discharge plan were made with and agreed upon by patient/family: Yes Time For Goal Achievement: 7 days Wound Therapy - Potential for Goals: Good  Goals will be updated until maximal potential achieved or discharge criteria met.  Discharge criteria: when goals achieved, discharge from hospital, MD decision/surgical intervention, no progress towards goals, refusal/missing three consecutive treatments without notification or medical reason.  GP     Thelma Comp 05/29/2019, 2:16 PM   Rolinda Roan, PT, DPT Acute Rehabilitation Services Pager: [phone] Office: [phone]

## 2019-05-29 NOTE — Progress Notes (Signed)
Occupational Therapy Session Note  Patient Details  Name: Robert [name] MRN: [id number] Date of Birth: June 20, 1951  Today's Date: 05/29/2019 OT Individual Time: 1030-1145 OT Individual Time Calculation (min): 75 min    Short Term Goals: Week 1:  OT Short Term Goal 1 (Week 1): Pt will complete BSC/toilet transfer with max A with LRAD OT Short Term Goal 2 (Week 1): Pt will thread BLE into pants with AE PRN and MIN A OT Short Term Goal 3 (Week 1): Pt will dof/don socks with AE and no VC to demo improved memory OT Short Term Goal 4 (Week 1): Pt will don shirt with S  Skilled Therapeutic Interventions/Progress Updates: patient in bed upon approach for OT and though he stated he did not have clean pants  and that he does not like to wear hospital scrub pants that he would get OOB and bathe at sink and participate as his endurance allowed.   He participated as follows:  Supine to EOB= S          Bed to w/c transfer= cues to scoot forward and bed before attempting sit to stand.   Initial difficulty but able to complete with close S after incorporating verbal cues for technique.    UB bathing & dressing=setup          LB bathing= rest breaks between parts of body and especially after sit to stand and dynamic standing to wash periarea and pull up pants           LB dressing= Total A for left shoe with AFO and moderate assistance for right shoes.   He was able to don socks with min A for correct placement of sock and assist to pull the socks completely onto the aide.    He lacked strength in his digits to pull on the socks this session.  Patient also partiicipated in upper extremity and core strengthening, as well as static and dynamic standing balance activities to increase balance, strength and safety for ADLs.     Rest breaks throughout the session, exhibiting carryover from education regarding work simplification and energy conservation    Continue OT Plan of Care Therapy  Documentation Precautions:  Precautions Precautions: Fall Precaution Comments: sacral wound, supplemental oxygen Required Braces or Orthoses: Other Brace Other Brace: B Prevalon boots Restrictions Weight Bearing Restrictions: No Pain: Pain Assessment Pain Scale: 0-10 Pain Score: 0-No pain   Therapy/Group: Individual Therapy  Robert [name] [facility] 05/29/2019, 11:13 AM

## 2019-05-29 NOTE — Progress Notes (Signed)
Physical Therapy Session Note  Patient Details  Name: Robert [name] MRN: [id number] Date of Birth: 1951-03-06  Today's Date: 05/29/2019 PT Individual Time: 1403-1500, [id number] PT Individual Time Calculation (min): 57 min , 31  Short Term Goals: Week 1:  PT Short Term Goal 1 (Week 1): Pt will complete bed mobility consistently with CGA. PT Short Term Goal 2 (Week 1): Pt will complete bed<>w/c transfers with mod assist +1. PT Short Term Goal 3 (Week 1): Pt will ambulate 15 ft with LRAD & max assist +1. PT Short Term Goal 4 (Week 1): Pt will propel w/c x 50 ft with BUE & supervision.      Skilled Therapeutic Interventions/Progress Updates:  Pt resting in bed; he denied pain.Luster Landsberg here to observe pt. Pt now has personal L AFO. He is using an air bed due to buttock wounds.   PT issued her a Home Measurement sheet .  He has 3-4 steps iwht bil rails to enter house.   Sit> stand from bed with mod assist.  Stand pivot with RW, min assist.  W/c propulsion using bil UEs and holding bil LEs up, x 100' with supervision.  Self stretching R/L heel cords and hamstrings in sitting in w/c x 30 sec x 3, using foot stool.  Pt remembered techniques iwht min cues.   Gait training with RW on level tile x 130' iwht mulitple turns, CGA.  Up/down 1 steps bil rails, min assist , step-to method.  Pt descended backwards.  Number of steps limited by fatigue.   Toilet transfer using wall bar and w/c armrest, min assist.  Pt had BM.  Peri care by pt in squatting position, min assist for balance.  Hand washing at w/c level, independent. Pt exhausted and asked to return to bed.  He was unable to do stand/pivot.  PT deflated air bed and pt transferred via squat pivot with mod assist to L.  Sit> supine with supervision.  Bed alarm set and needs left at hand.  Pt resting on his side.  tx 2:  Pt resting in bed.  He denied pain.  Therapeutic exercises performed with LE to increase strength for functional  mobility-supine: 10 x 2 cervical flexion, bil bridging, 5 x 2 R/L straight leg raises focusing on eccentric control, 15 x 1 glut sets. In L/R side lying, R/Lclam shells for hip abduction activation. 10 x 1 R/L shoulder protraction.  At end of session, bed alarm set and needs left at hand     Therapy Documentation Precautions:  Precautions Precautions: Fall Precaution Comments: sacral wound, supplemental oxygen Required Braces or Orthoses: Other Brace Other Brace: B Prevalon boots Restrictions Weight Bearing Restrictions: No     Therapy/Group: Individual Therapy  Johana Hopkinson 05/29/2019, 2:25 PM

## 2019-05-30 ENCOUNTER — Inpatient Hospital Stay (HOSPITAL_COMMUNITY): Payer: Medicare HMO

## 2019-05-30 ENCOUNTER — Inpatient Hospital Stay (HOSPITAL_COMMUNITY): Payer: Medicare HMO | Admitting: Speech Pathology

## 2019-05-30 ENCOUNTER — Inpatient Hospital Stay (HOSPITAL_COMMUNITY): Payer: Medicare HMO | Admitting: Occupational Therapy

## 2019-05-30 LAB — GLUCOSE, CAPILLARY
Glucose-Capillary: 156 mg/dL — ABNORMAL HIGH (ref 70–99)
Glucose-Capillary: 89 mg/dL (ref 70–99)
Glucose-Capillary: 189 mg/dL — ABNORMAL HIGH (ref 70–99)
Glucose-Capillary: 175 mg/dL — ABNORMAL HIGH (ref 70–99)

## 2019-05-30 MED ORDER — LIDOCAINE HCL URETHRAL/MUCOSAL 2 % EX GEL
1.0000 "application " | Freq: Every day | CUTANEOUS | Status: DC
  Administered 2019-05-30 – 2019-06-06 (×29): 1 via URETHRAL
  Filled 2019-05-30: qty 5
  Filled 2019-05-30: qty 15
  Filled 2019-05-30 (×7): qty 5

## 2019-05-30 MED ORDER — LIDOCAINE HCL URETHRAL/MUCOSAL 2 % EX GEL: 1.0000 "application " | CUTANEOUS | Status: DC | PRN

## 2019-05-30 NOTE — Plan of Care (Signed)
  Problem: RH Balance Goal: LTG Patient will maintain dynamic standing balance (PT) Description: LTG:  Patient will maintain dynamic standing balance with assistance during mobility activities (PT) Flowsheets (Taken 05/30/2019 1211) LTG: Pt will maintain dynamic standing balance during mobility activities with:: (upgraded due to progress) Supervision/Verbal cueing Note: upgraded due to progress   Problem: Sit to Stand Goal: LTG:  Patient will perform sit to stand with assistance level (PT) Description: LTG:  Patient will perform sit to stand with assistance level (PT) Flowsheets (Taken 05/30/2019 1211) LTG: PT will perform sit to stand in preparation for functional mobility with assistance level: (upgraded due to progress) Supervision/Verbal cueing Note: upgraded due to progress   Problem: RH Bed to Chair Transfers Goal: LTG Patient will perform bed/chair transfers w/assist (PT) Description: LTG: Patient will perform bed to chair transfers with assistance (PT). Flowsheets (Taken 05/30/2019 1211) LTG: Pt will perform Bed to Chair Transfers with assistance level: (upgraded due to progress) Supervision/Verbal cueing Note: upgraded due to progress   Problem: RH Ambulation Goal: LTG Patient will ambulate in controlled environment (PT) Description: LTG: Patient will ambulate in a controlled environment, # of feet with assistance (PT). Flowsheets (Taken 05/30/2019 1211) LTG: Pt will ambulate in controlled environ  assist needed:: (upgraded due to progress) Supervision/Verbal cueing LTG: Ambulation distance in controlled environment: 150' Note: upgraded due to progress

## 2019-05-30 NOTE — Progress Notes (Signed)
Physical Therapy Weekly Progress Note  Patient Details  Name: STEFFAN CANIGLIA MRN: [id number] Date of Birth: 1950-09-16  Beginning of progress report period: May 23, 2019 End of progress report period: May 30, 2019  Today's Date: 05/30/2019 PT Individual Time: 1100-1200 PT Individual Time Calculation (min): 60 min  Functional bed mobility with supervision to come to EOB. Initial sit <> stand with mod assist for anterior weightshift/facilitation and to power up into standing with cues for hand placement and technique. Pt requires min assist for functional transfer to w/c with cues for eccentric control. Stair negotiation training for home entry practice (goal directed by patient) x 4 steps ascending with bilateral rails and descending sideways with L rail - step to pattern and cues for which foot to lead with requiring min to mod assist to complete. Pt fatigued after and declined attempting again but very pleased with being able to do these for the first time. Dynamic gait training through obstacle course including gait over uneven surface and navigating turns through cones with RW with overall min assist and cues for safe positioning of RW as well as body positioning. Cues for sit <> stand technique (does better when pushing up with BUE from chair/mat than attempting to hold onto RW). Short distance gait with RW to mat table to instruct in supine low back stretches (reports discomfort and tightness).  Decreased clearance of LLE during gait requiring min assist for balance. Supervision bed mobility on flat surface and instructed in supine trunk rotation x 10 reps gentle ROM and then single knee to chest x 3 reps each side. Gait training with RW back to room x 180' with CGA to min assist with cues for attention to L foot clearance. Pt very pleased with progress overall. Discussed upgrading goals and overall progress.   Patient has met 4 of 4 short term goals. Pt is making excellent functional  progress with mobility. Pt is now tolerating activity on room air and not requiring supplemental O2 during functional mobility. Pt is overall min to mod assist requiring occasional mod assist for lower surface sit <> stands and for stair negotiation, otherwise min assist for transfers and gait with RW. Pt is demonstrating improvements with overall activity tolerance and endurance.  Patient continues to demonstrate the following deficits muscle weakness, muscle joint tightness and muscle paralysis, decreased cardiorespiratoy endurance, unbalanced muscle activation and decreased standing balance, decreased postural control and decreased balance strategies and therefore will continue to benefit from skilled PT intervention to increase functional independence with mobility.  Patient progressing toward long term goals..  Plan of care revisions: upgraded some LTG (dynamic standing balance, sit <> stands, basic transfers and controlled environment gait).  PT Short Term Goals Week 1:  PT Short Term Goal 1 (Week 1): Pt will complete bed mobility consistently with CGA. PT Short Term Goal 1 - Progress (Week 1): Met PT Short Term Goal 2 (Week 1): Pt will complete bed<>w/c transfers with mod assist +1. PT Short Term Goal 2 - Progress (Week 1): Met PT Short Term Goal 3 (Week 1): Pt will ambulate 15 ft with LRAD & max assist +1. PT Short Term Goal 3 - Progress (Week 1): Met PT Short Term Goal 4 (Week 1): Pt will propel w/c x 50 ft with BUE & supervision. PT Short Term Goal 4 - Progress (Week 1): Met Week 2:  PT Short Term Goal 1 (Week 2): Pt will perform sit <.> stands with CGA PT Short Term Goal 2 (Week  2): Pt will perform gait x 150' with CGA PT Short Term Goal 3 (Week 2): Pt will be able to perform 3 steps with min assist for home entry  Skilled Therapeutic Interventions/Progress Updates:  Ambulation/gait training;Community reintegration;DME/adaptive equipment instruction;Neuromuscular  re-education;Psychosocial support;Stair training;UE/LE Strength taining/ROM;Wheelchair propulsion/positioning;UE/LE Coordination activities;Therapeutic Activities;Pain management;Skin care/wound management;Functional electrical stimulation;Discharge planning;Balance/vestibular training;Cognitive remediation/compensation;Disease management/prevention;Functional mobility training;Patient/family education;Splinting/orthotics;Therapeutic Exercise;Visual/perceptual remediation/compensation   Therapy Documentation Precautions:  Precautions Precautions: Fall Precaution Comments: sacral wound, supplemental oxygen Required Braces or Orthoses: Other Brace Other Brace: B Prevalon boots Restrictions Weight Bearing Restrictions: No Vital Signs: Oxygen Therapy SpO2: 92-96 % with activity O2 Device: Room Air Pain: Reports generalized muscle soreness - reports taking tylenol  Therapy/Group: Individual Therapy  Canary Brim Ivory Broad, PT, DPT, CBIS  05/30/2019, 12:15 PM

## 2019-05-30 NOTE — Progress Notes (Signed)
[place] PHYSICAL MEDICINE & REHABILITATION PROGRESS NOTE   Subjective/Complaints: Pt up with therapy. Overall happy with progress. Frustrated that bladder is not emptying. Has urge to empty but is unable to do so.   ROS: Patient denies fever, rash, sore throat, blurred vision, nausea, vomiting, diarrhea, cough, shortness of breath or chest pain, joint or back pain, headache, or mood change.    Objective:   Dg Swallowing Func-speech Pathology  Result Date: 05/29/2019 Objective Swallowing Evaluation: Type of Study: MBS-Modified Barium Swallow Study  Patient Details Name: Robert [name] MRN: [id number] Date of Birth: 1951-04-25 Today's Date: 05/29/2019 Time: SLP Start Time (ACUTE ONLY): [id number] -SLP Stop Time (ACUTE ONLY): 0945 SLP Time Calculation (min) (ACUTE ONLY): 40 min Past Medical History: Past Medical History: Diagnosis Date . Anxiety  . COPD (chronic obstructive pulmonary disease) ([place])  . Coronary artery disease  . Full dentures  . GERD (gastroesophageal reflux disease)   Rolaids as needed . High cholesterol  . History of MI (myocardial infarction)  . Hypertension   med. dosage increased 04/2016; has been on BP med. x 10 yrs. . Incarcerated umbilical hernia 0000000 . Inguinal hernia 05/2016  bilateral  . Insulin dependent diabetes mellitus ([place])  . Ischemic cardiomyopathy   a. 03/2015 EF 40-45% by LV gram. . Left leg cellulitis 10/08/2016 . Rheumatoid arthritis(714.0)  Past Surgical History: Past Surgical History: Procedure Laterality Date . CARDIAC CATHETERIZATION N/A 02/01/2015  Procedure: Left Heart Cath and Coronary Angiography;  Surgeon: Belva Crome, MD;  Location: [place] [facility];  Service: Cardiovascular;  Laterality: N/A; . CARDIAC CATHETERIZATION N/A 03/22/2015  Procedure: Left Heart Cath and Coronary Angiography;  Surgeon: Leonie Man, MD;  Location: [place] [facility];  Service: Cardiovascular;  Laterality: N/A; . CARDIAC CATHETERIZATION  09/05/2002; 03/09/2003; 04/10/2005;06/02/2005;  05/09/2006; 02/09/2007; 03/01/2007 . COLONOSCOPY  2008  Dr. Gala Romney: diverticulosis, hyperplastic polyp. . CORONARY ANGIOPLASTY  11/14/2012; 02/01/2015 . CORONARY ANGIOPLASTY WITH STENT PLACEMENT  05/16/2012  "1; makes total ~ 4" . CORONARY STENT INTERVENTION N/A 06/06/2017  Procedure: CORONARY STENT INTERVENTION;  Surgeon: Belva Crome, MD;  Location: [place] [facility];  Service: Cardiovascular;  Laterality: N/A; . INSERTION OF MESH Bilateral 05/24/2016  Procedure: INSERTION OF MESH;  Surgeon: Coralie Keens, MD;  Location: [place];  Service: General;  Laterality: Bilateral; . LAPAROSCOPIC CHOLECYSTECTOMY   . LAPAROSCOPIC INGUINAL HERNIA WITH UMBILICAL HERNIA Bilateral 05/24/2016  Procedure: BILATERAL LAPAROSCOPIC INGUINAL HERNIA REPAIR WITH MESH AND UMBILICAL HERNIA REPAIR WITH MESH;  Surgeon: Coralie Keens, MD;  Location: [place];  Service: General;  Laterality: Bilateral; . LEFT HEART CATH AND CORONARY ANGIOGRAPHY N/A 06/06/2017  Procedure: LEFT HEART CATH AND CORONARY ANGIOGRAPHY;  Surgeon: Belva Crome, MD;  Location: [place] [facility];  Service: Cardiovascular;  Laterality: N/A; . LEFT HEART CATHETERIZATION WITH CORONARY ANGIOGRAM N/A 12/22/2011  Procedure: LEFT HEART CATHETERIZATION WITH CORONARY ANGIOGRAM;  Surgeon: [place] Sine, MD;  Location: [facility] CATH LAB;  Service: Cardiovascular;  Laterality: N/A; . LEFT HEART CATHETERIZATION WITH CORONARY ANGIOGRAM Bilateral 05/16/2012  Procedure: LEFT HEART CATHETERIZATION WITH CORONARY ANGIOGRAM;  Surgeon: Lorretta Harp, MD;  Location: [facility] CATH LAB;  Service: Cardiovascular;  Laterality: Bilateral; . LEFT HEART CATHETERIZATION WITH CORONARY ANGIOGRAM N/A 11/14/2012  Procedure: LEFT HEART CATHETERIZATION WITH CORONARY ANGIOGRAM;  Surgeon: [place] Sine, MD;  Location: [facility] CATH LAB;  Service: Cardiovascular;  Laterality: N/A; . LEFT HEART CATHETERIZATION WITH CORONARY ANGIOGRAM N/A 09/01/2013  Procedure: LEFT HEART CATHETERIZATION WITH  CORONARY ANGIOGRAM;  Surgeon: Leonie Man, MD;  Location: [facility] CATH LAB;  Service: Cardiovascular;  Laterality: N/A; . [place]; 1973; 1985  x 3 . NM MYOCAR PERF WALL MOTION  08/30/2009  No significant ischemia . OPEN REDUCTION INTERNAL FIXATION (ORIF) DISTAL RADIAL FRACTURE Right 12/10/2016  Procedure: OPEN REDUCTION INTERNAL FIXATION (ORIF) DISTAL RADIAL FRACTURE;  Surgeon: Iran Planas, MD;  Location: [place];  Service: Orthopedics;  Laterality: Right; . PERCUTANEOUS CORONARY INTERVENTION-BALLOON ONLY  12/22/2011  Procedure: PERCUTANEOUS CORONARY INTERVENTION-BALLOON ONLY;  Surgeon: [place] Sine, MD;  Location: [facility] CATH LAB;  Service: Cardiovascular;; . PERCUTANEOUS CORONARY INTERVENTION-BALLOON ONLY  11/14/2012  Procedure: PERCUTANEOUS CORONARY INTERVENTION-BALLOON ONLY;  Surgeon: [place] Sine, MD;  Location: [facility] CATH LAB;  Service: Cardiovascular;; . PERCUTANEOUS CORONARY STENT INTERVENTION (PCI-S)  05/16/2012  Procedure: PERCUTANEOUS CORONARY STENT INTERVENTION (PCI-S);  Surgeon: Lorretta Harp, MD;  Location: [facility] CATH LAB;  Service: Cardiovascular;; . PERCUTANEOUS CORONARY STENT INTERVENTION (PCI-S)  09/01/2013  Procedure: PERCUTANEOUS CORONARY STENT INTERVENTION (PCI-S);  Surgeon: Leonie Man, MD;  Location: [facility] CATH LAB;  Service: Cardiovascular;; HPI: See H&P  Subjective: Pt was pleasant and cooperative.  He was agreeable to MBS. Assessment / Plan / Recommendation CHL IP CLINICAL IMPRESSIONS 05/29/2019 Clinical Impression Patient demonstrates minimally improved swallow function compared to his previous MBS. Patient continues to demonstrate moderate pharyngeal dysphagia due to decreased glottic closure and impaired pharyngeal strength. These deficits in result in intermittent, deep penetration of thin liquids via cup despite use of compensatory strategies and intermittent sensed aspiration with larger boluses of thin.  Although the chin tuck was ineffective in eliminating deep penetration of  thin, it was effective in decreased vallecular residue with solid textures and reduced amount of penetrates with nectar-thick liquids. Recommend patient continue current diet of Dys. 3 textures with nectar-thick liquids but initiate use of a chin tuck to help minimize pharyngeal residue. Also recommend patient initiate the water protocol. Educated patient in regards to results and recommendations, he verbalized understanding. SLP Visit Diagnosis Dysphagia, pharyngeal phase (R13.13) Attention and concentration deficit following -- Frontal lobe and executive function deficit following -- Impact on safety and function Moderate aspiration risk   CHL IP TREATMENT RECOMMENDATION 05/29/2019 Treatment Recommendations Therapy as outlined in treatment plan below   Prognosis 05/29/2019 Prognosis for Safe Diet Advancement Good Barriers to Reach Goals Time post onset;Severity of deficits Barriers/Prognosis Comment -- CHL IP DIET RECOMMENDATION 05/29/2019 SLP Diet Recommendations Nectar thick liquid;Dysphagia 3 (Mech soft) solids Liquid Administration via Cup Medication Administration Whole meds with puree Compensations Slow rate;Small sips/bites;Follow solids with liquid;Minimize environmental distractions;Clear throat intermittently;Multiple dry swallows after each bite/sip;Chin tuck Postural Changes --   CHL IP OTHER RECOMMENDATIONS 05/29/2019 Recommended Consults -- Oral Care Recommendations Oral care BID Other Recommendations Order thickener from pharmacy;Prohibited food (jello, ice cream, thin soups);Remove water pitcher;Have oral suction available;Clarify dietary restrictions   CHL IP FOLLOW UP RECOMMENDATIONS 05/29/2019 Follow up Recommendations Inpatient Rehab   CHL IP FREQUENCY AND DURATION 05/29/2019 Speech Therapy Frequency (ACUTE ONLY) min 5x/week Treatment Duration 2 weeks      CHL IP ORAL PHASE 05/29/2019 Oral Phase WFL Oral - Pudding Teaspoon -- Oral - Pudding Cup -- Oral - Honey Teaspoon -- Oral - Honey Cup -- Oral -  Nectar Teaspoon -- Oral - Nectar Cup -- Oral - Nectar Straw -- Oral - Thin Teaspoon -- Oral - Thin Cup -- Oral - Thin Straw -- Oral - Puree -- Oral - Mech Soft -- Oral - Regular -- Oral -  Multi-Consistency -- Oral - Pill -- Oral Phase - Comment --  CHL IP PHARYNGEAL PHASE 05/29/2019 Pharyngeal Phase Impaired Pharyngeal- Pudding Teaspoon -- Pharyngeal -- Pharyngeal- Pudding Cup -- Pharyngeal -- Pharyngeal- Honey Teaspoon -- Pharyngeal -- Pharyngeal- Honey Cup NT Pharyngeal -- Pharyngeal- Nectar Teaspoon NT Pharyngeal -- Pharyngeal- Nectar Cup Penetration/Aspiration during swallow;Reduced epiglottic inversion;Pharyngeal residue - valleculae;Reduced airway/laryngeal closure Pharyngeal Material enters airway, remains ABOVE vocal cords then ejected out;Material enters airway, remains ABOVE vocal cords and not ejected out;Material does not enter airway Pharyngeal- Nectar Straw NT Pharyngeal -- Pharyngeal- Thin Teaspoon -- Pharyngeal -- Pharyngeal- Thin Cup Delayed swallow initiation-vallecula;Reduced airway/laryngeal closure;Penetration/Aspiration during swallow;Pharyngeal residue - valleculae Pharyngeal Material enters airway, CONTACTS cords and then ejected out;Material enters airway, CONTACTS cords and not ejected out Pharyngeal- Thin Straw Penetration/Aspiration during swallow;Pharyngeal residue - valleculae;Reduced airway/laryngeal closure;Reduced epiglottic inversion Pharyngeal Material enters airway, passes BELOW cords then ejected out;Material enters airway, CONTACTS cords and not ejected out;Material enters airway, CONTACTS cords and then ejected out Pharyngeal- Puree Reduced epiglottic inversion;Pharyngeal residue - valleculae Pharyngeal -- Pharyngeal- Mechanical Soft -- Pharyngeal -- Pharyngeal- Regular Pharyngeal residue - valleculae;Reduced epiglottic inversion Pharyngeal -- Pharyngeal- Multi-consistency -- Pharyngeal -- Pharyngeal- Pill -- Pharyngeal -- Pharyngeal Comment --  CHL IP CERVICAL ESOPHAGEAL PHASE  05/22/2019 Cervical Esophageal Phase -- Pudding Teaspoon -- Pudding Cup -- Honey Teaspoon -- Honey Cup -- Nectar Teaspoon -- Nectar Cup -- Nectar Straw -- Thin Teaspoon -- Thin Cup -- Thin Straw -- Puree -- Mechanical Soft -- Regular -- Multi-consistency -- Pill -- Cervical Esophageal Comment Barium observed in esophagus following po trials.  [place], [place] 05/29/2019, 3:51 PM    Weston [name], MA, CCC-SLP [phone]           No results for input(s): WBC, HGB, HCT, PLT in the last 72 hours. No results for input(s): NA, K, CL, CO2, GLUCOSE, BUN, CREATININE, CALCIUM in the last 72 hours.  Intake/Output Summary (Last 24 hours) at 05/30/2019 0930 Last data filed at 05/30/2019 0800 Gross per 24 hour  Intake 900 ml  Output 2825 ml  Net -1925 ml     Physical Exam: Vital Signs Blood pressure 120/67, pulse 69, temperature 98 F (36.7 C), temperature source Oral, resp. rate 16, height 6' (1.829 m), weight 96 kg, SpO2 91 %. Constitutional: No distress . Vital signs reviewed. HEENT: EOMI, oral membranes moist, voice stronger Neck: supple Cardiovascular: RRR without murmur. No JVD    Respiratory: CTA Bilaterally without wheezes or rales. Normal effort    GI: BS +, non-tender, non-distended  Skin: decubitus ulcer not visualized today. Psych: pleasant. cooperative Musc: No edema.  No tenderness. Neurological: Alert and oriented Motor: Bilateral upper extremities: 4/5 proximal distal Right lower extremity: 3+/5 hip flexion, 4/5 knee extension, 4/5 ankle dorsiflexion Left lower extremity: 3+/5 hip flexion, knee extension, ankle dorsiflexion  Assessment/Plan: 1. Functional deficits secondary to debility which require 3+ hours per day of interdisciplinary therapy in a comprehensive inpatient rehab setting.  Physiatrist is providing close team supervision and 24 hour management of active medical problems listed below.  Physiatrist and rehab team continue to assess barriers to discharge/monitor patient  progress toward functional and medical goals  Care Tool:  Bathing    Body parts bathed by patient: Right arm, Left arm, Chest, Abdomen, Face   Body parts bathed by helper: Buttocks, Right upper leg, Left upper leg, Right lower leg, Left lower leg     Bathing assist Assist Level: Supervision/Verbal cueing(UB ONLY)     Upper Body Dressing/Undressing Upper  body dressing   What is the patient wearing?: Pull over shirt    Upper body assist Assist Level: Set up assist    Lower Body Dressing/Undressing Lower body dressing      What is the patient wearing?: Pants     Lower body assist Assist for lower body dressing: Minimal Assistance - Patient > 75%     Toileting Toileting Toileting Activity did not occur (Clothing management and hygiene only): N/A (no void or bm)  Toileting assist Assist for toileting: Maximal Assistance - Patient 25 - 49%     Transfers Chair/bed transfer  Transfers assist  Chair/bed transfer activity did not occur: Safety/medical concerns  Chair/bed transfer assist level: Minimal Assistance - Patient > 75%     Locomotion Ambulation   Ambulation assist   Ambulation activity did not occur: Safety/medical concerns  Assist level: Minimal Assistance - Patient > 75% Assistive device: Walker-rolling Max distance: 10 ft   Walk 10 feet activity   Assist  Walk 10 feet activity did not occur: Safety/medical concerns  Assist level: Minimal Assistance - Patient > 75% Assistive device: Walker-rolling   Walk 50 feet activity   Assist Walk 50 feet with 2 turns activity did not occur: Safety/medical concerns         Walk 150 feet activity   Assist Walk 150 feet activity did not occur: Safety/medical concerns         Walk 10 feet on uneven surface  activity   Assist Walk 10 feet on uneven surfaces activity did not occur: Safety/medical concerns         Wheelchair     Assist Will patient use wheelchair at discharge?:  (TBD) Type of Wheelchair: Manual Wheelchair activity did not occur: Refused         Wheelchair 50 feet with 2 turns activity    Assist    Wheelchair 50 feet with 2 turns activity did not occur: Refused       Wheelchair 150 feet activity     Assist  Wheelchair 150 feet activity did not occur: Refused       Blood pressure 120/67, pulse 69, temperature 98 F (36.7 C), temperature source Oral, resp. rate 16, height 6' (1.829 m), weight 96 kg, SpO2 91 %.  Medical Problem List and Plan: 1.Functional and mobility deficitssecondary to debility after Covid respiratory failure  Continue CIR PT, OT, SLP 2.A fib/Antithrombotics: -DVT/anticoagulation:Pharmaceutical:Other (comment)--Eliquis. -antiplatelet therapy: On Plavix. 3. Pain Management:oxycodone prn. 4. Mood:LCSW to follow for evaluation and support. -antipsychotic agents: N/A 5. Neuropsych: This patientiscapable of making decisions on hisown behalf. 6.Sacral decub/Skin/Wound Care: - air mattress. -Continue Santyl with damp to dry dressings to slough on decubiti  - Hydrotherapy ongoing 7. Fluids/Electrolytes/Nutrition: encourage PO  -intake is solid  -continue protein supp for low albumin  -iron studies suggest anemia of chronic disease 8. CAD/ICM: Not surgical candidate. Treated medically with metoprolol, Plavix and lipitor. 9. Hx fluid overload: Monitor for signs of over load and check weights daily.Cont lasix 40mg  po daily    On chronic lasix with normal EF, monitor I/Os intake per pt is less than at home (home lasix  dose 80mg )  Filed Weights   05/28/19 0536 05/29/19 0408 05/30/19 0238  Weight: 102 kg 95.9 kg 96 kg     Stable on 9/18 10 Hypokalemia:  Potassium 4.5 on 9/14  Continue supplement 11. T2DM with hyperglycemia: Hgb A1c- 7.7. Was on Toujeo, metformin and acarbose PTA. Acarbose added back on 9/08 with novolog for meal  coverage.    -poor control still   -hesitate to resume metformin given his borderline Cr  -added amaryl 1mg  daily 9/17  -continue lantus 14u qhs 9/14--consider weaning as orals are titrated  -observe cbg's today before further changes   CBG (last 3)  Recent Labs    05/29/19 1707 05/29/19 2045 05/30/19 0639  GLUCAP 185* 204* 156*    12. Anxiety disorder: Provide ego support. Continue Xanax prn.  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 Amil Amen. Stable off methotrexate/ Actemra at this time.  14. Urinary retention:     Continue flomax--0.8mg  qhs  Remains unable to void thus far although has urge to void  -no cholinergics given his CAD--explained to patient -up to toilet or BSC to void  Continue I/O caths, lidocaine for comfort--needs to be applied before each cath 13. COPD: Now on Dulera BID with nebs prn  ?  Supplemental oxygen weaned 14. HTN:     Vitals:   05/29/19 2005 05/30/19 0238  BP: 130/66 120/67  Pulse: 82 69  Resp: 18 16  Temp: 98.6 F (37 C) 98 F (36.7 C)  SpO2: 93% 91%   Controlled on 9/18 15. PAF: In NSR normal rate  on metoprolol and Eliquis.   LOS: 8 days A FACE TO FACE EVALUATION WAS PERFORMED  Meredith Staggers 05/30/2019, 9:30 AM

## 2019-05-30 NOTE — Progress Notes (Signed)
Occupational Therapy Session Note  Patient Details  Name: Robert [name] MRN: [id number] Date of Birth: May 06, 1951  Today's Date: 05/30/2019 OT Individual Time: [id number] OT Individual Time Calculation (min): 59 min   Short Term Goals: Week 1:  OT Short Term Goal 1 (Week 1): Pt will complete BSC/toilet transfer with max A with LRAD OT Short Term Goal 2 (Week 1): Pt will thread BLE into pants with AE PRN and MIN A OT Short Term Goal 3 (Week 1): Pt will dof/don socks with AE and no VC to demo improved memory OT Short Term Goal 4 (Week 1): Pt will don shirt with S  Skilled Therapeutic Interventions/Progress Updates:    Pt greeted in bed with no c/o pain. His breakfast tray was present and pt was ready to eat. Supine<sit completed with supervision assist from flat bed using bedrail. He sat EOB for ~28 minutes while eating breakfast with mattress set at max firmess. Occasional Min A for sitting balance due to posterior LOBs with pt exhibiting posterior bias and Lt lean for majority of meal. Noted requirement of unilateral support on mattress when eating. Min vcs for swallowing precautions with pt having good carryover of chin tuck. OT thickened his soda to nectar consistency. Afterwards he wanted to get dressed. Mod A for sit<stand with RW, and Min A for stand pivot<w/c. Pt spot washed UB at the sink with setup. Assist for perihygiene in the back while standing with Min A for balance. He was able to doff LB clothing with use of reacher but able to thread LEs into pants without AE. Setup for overhead shirt. Assist to elevate pants in standing during fatigue and max A for footwear including Lt AFO. Mod A for sit<stand with RW, and Min A for transfer back to bed. He reported RN staff would be in shortly to change his sacral dressings and they would do this most easily in the bed. Pt was left in sidelying for pressure relief. All needs within reach and 4 bedrails up. Tx focus placed on activity tolerance,  sitting/standing balance, and ADL retraining.   02 sats on RA remained between 94-97% while engaged in functional activity today.    Therapy Documentation Precautions:  Precautions Precautions: Fall Precaution Comments: sacral wound, supplemental oxygen Required Braces or Orthoses: Other Brace Other Brace: B Prevalon boots Restrictions Weight Bearing Restrictions: No ADL: ADL Grooming: Other (comment)(refused) Upper Body Bathing: Supervision/safety Where Assessed-Upper Body Bathing: Edge of bed Lower Body Bathing: Maximal assistance Where Assessed-Lower Body Bathing: Edge of bed(standing EOB) Upper Body Dressing: Minimal assistance Where Assessed-Upper Body Dressing: Edge of bed Lower Body Dressing: Maximal assistance Where Assessed-Lower Body Dressing: Edge of bed Toileting: Not assessed Toilet Transfer: Dependent(stedy)    Therapy/Group: Individual Therapy  Verdell Dykman A Yakir Wenke 05/30/2019, 12:36 PM

## 2019-05-30 NOTE — Plan of Care (Signed)
  Problem: Consults Goal: Diabetes Guidelines if Diabetic/Glucose > 140 Description: If diabetic or lab glucose is > 140 mg/dl - Initiate Diabetes/Hyperglycemia Guidelines & Document Interventions  Outcome: Progressing   Problem: RH BLADDER ELIMINATION Goal: RH STG MANAGE BLADDER WITH EQUIPMENT WITH ASSISTANCE Description: STG Manage Bladder With Equipment With min Assistance Outcome: Progressing   Problem: RH SKIN INTEGRITY Goal: RH STG ABLE TO PERFORM INCISION/WOUND CARE W/ASSISTANCE Description: STG Able To Perform Incision/Wound Care With min Assistance. Outcome: Progressing   Problem: RH PAIN MANAGEMENT Goal: RH STG PAIN MANAGED AT OR BELOW PT'S PAIN GOAL Description: Pt will report pain of less than 3 out of 10 Outcome: Progressing

## 2019-05-30 NOTE — Progress Notes (Signed)
Physical Therapy Wound Treatment Patient Details  Name: Robert [name] MRN: [id number] Date of Birth: 24-Nov-1950  Today's Date: 05/30/2019 PT Individual Time: 1000-1040 PT Individual Time Calculation (min): 40 min   Subjective  Subjective: Pt pleasant and agreeable to hydrotherapy. Daughter present for session today. Patient and Family Stated Goals: Heal wound, keep it from getting worse Date of Onset: (Unknown) Prior Treatments: Santyl and wet>dry dressing changes  Pain Score: Pt reports mild pain with treatment. Tolerated well without premedication.  Wound Assessment  Pressure Injury 04/17/19 Sacrum Medial Unstageable - Full thickness tissue loss in which the base of the ulcer is covered by slough (yellow, tan, gray, green or brown) and/or eschar (tan, brown or black) in the wound bed. dark purple with some skin sloug (Active)  Dressing Type ABD;Gauze (Comment);Barrier Film (skin prep);Moist to dry 05/30/19 1051  Dressing Clean;Dry;Intact 05/30/19 1051  Dressing Change Frequency Daily 05/30/19 1051  State of Healing Non-healing 05/30/19 1051  Site / Wound Assessment Yellow 05/30/19 1051  % Wound base Red or Granulating 0% 05/30/19 1051  % Wound base Yellow/Fibrinous Exudate 100% 05/30/19 1051  % Wound base Black/Eschar 0% 05/30/19 1051  Peri-wound Assessment Intact;Purple 05/30/19 1051  Wound Length (cm) 2 cm 05/27/19 1132  Wound Width (cm) 0.5 cm 05/27/19 1132  Wound Depth (cm) 0 cm 05/27/19 1132  Wound Surface Area (cm^2) 1 cm^2 05/27/19 1132  Wound Volume (cm^3) 0 cm^3 05/27/19 1132  Margins Unattached edges (unapproximated) 05/30/19 1051  Drainage Amount Minimal 05/30/19 1051  Drainage Description Serosanguineous 05/30/19 1051  Treatment Debridement (Selective);Hydrotherapy (Pulse lavage);Packing (Saline gauze) 05/30/19 1051   Santyl applied to wound bed prior to applying dressing.    Pressure Injury 04/17/19 Buttocks Right;Lower Unstageable - Full thickness tissue loss in  which the base of the ulcer is covered by slough (yellow, tan, gray, green or brown) and/or eschar (tan, brown or black) in the wound bed. (Active)  Dressing Type ABD;Gauze (Comment);Barrier Film (skin prep);Moist to dry 05/30/19 1051  Dressing Clean;Dry;Intact 05/30/19 1051  Dressing Change Frequency Daily 05/30/19 1051  State of Healing Eschar 05/30/19 1051  Site / Wound Assessment Yellow;Pink;Brown 05/30/19 1051  % Wound base Red or Granulating 10% 05/30/19 1051  % Wound base Yellow/Fibrinous Exudate 85% 05/30/19 1051  % Wound base Black/Eschar 5% 05/30/19 1051  % Wound base Other/Granulation Tissue (Comment) 0% 05/30/19 1051  Peri-wound Assessment Erythema (blanchable);Pink 05/30/19 1051  Wound Length (cm) 6 cm 05/27/19 1132  Wound Width (cm) 4 cm 05/27/19 1132  Wound Depth (cm) 0 cm 05/27/19 1132  Wound Surface Area (cm^2) 24 cm^2 05/27/19 1132  Wound Volume (cm^3) 0 cm^3 05/27/19 1132  Margins Unattached edges (unapproximated) 05/30/19 1051  Drainage Amount Minimal 05/30/19 1051  Drainage Description Serosanguineous 05/30/19 1051  Treatment Debridement (Selective);Hydrotherapy (Pulse lavage);Packing (Saline gauze) 05/30/19 1051   Santyl applied to wound bed prior to applying dressing.    Pressure Injury 04/17/19 Buttocks Left;Lower Unstageable - Full thickness tissue loss in which the base of the ulcer is covered by slough (yellow, tan, gray, green or brown) and/or eschar (tan, brown or black) in the wound bed. WOC updated to Stage 3 on  (Active)  Dressing Type ABD;Gauze (Comment);Moist to dry;Barrier Film (skin prep) 05/30/19 1051  Dressing Clean;Dry;Intact 05/30/19 1051  Dressing Change Frequency Daily 05/30/19 1051  State of Healing Non-healing 05/30/19 1051  Site / Wound Assessment Yellow 05/30/19 1051  % Wound base Red or Granulating 0% 05/30/19 1051  % Wound base Yellow/Fibrinous Exudate 100% 05/30/19 1051  %  Wound base Black/Eschar 0% 05/30/19 1051  Peri-wound Assessment  Erythema (blanchable) 05/30/19 1051  Wound Length (cm) 1.5 cm 05/27/19 1132  Wound Width (cm) 0.5 cm 05/27/19 1132  Wound Depth (cm) 0 cm 05/27/19 1132  Wound Surface Area (cm^2) 0.75 cm^2 05/27/19 1132  Wound Volume (cm^3) 0 cm^3 05/27/19 1132  Margins Unattached edges (unapproximated) 05/30/19 1051  Drainage Amount Minimal 05/30/19 1051  Drainage Description Serosanguineous 05/30/19 1051  Treatment Debridement (Selective);Hydrotherapy (Pulse lavage);Packing (Saline gauze) 05/30/19 1051   Santyl applied to wound bed prior to applying dressing.     Hydrotherapy Pulsed lavage therapy - wound location: bilateral buttock wounds and coccyx Pulsed Lavage with Suction (psi): 12 psi Pulsed Lavage with Suction - Normal Saline Used: 1000 mL Pulsed Lavage Tip: Tip with splash shield Selective Debridement Selective Debridement - Location: bilateral buttock wounds and coccyx Selective Debridement - Tools Used: Forceps;Scalpel Selective Debridement - Tissue Removed: Owens Shark and yellow necrotic tissue   Wound Assessment and Plan  Wound Therapy - Assess/Plan/Recommendations Wound Therapy - Clinical Statement: Progressing well with removal of necrotic tissue on R buttock wound. Slough on L buttock wound and coccyx wound starting to soften. Pt's daughter present for session and questions answered. Pt will benefit from continued hydrotherapy for continued selective removal of unviable tissue, to decrease bioburden, and promote wound bed healing.  Wound Therapy - Functional Problem List: Decreased tolerance for position changes due to skin breakdown, decreased mobility in general.  Factors Delaying/Impairing Wound Healing: Diabetes Mellitus;Immobility;Multiple medical problems Hydrotherapy Plan: Debridement;Dressing change;Patient/family education;Pulsatile lavage with suction Wound Therapy - Frequency: 6X / week Wound Therapy - Follow Up Recommendations: Home health RN Wound Plan: See above  Wound  Therapy Goals- Improve the function of patient's integumentary system by progressing the wound(s) through the phases of wound healing (inflammation - proliferation - remodeling) by: Decrease Necrotic Tissue to: 0 Decrease Necrotic Tissue - Progress: Progressing toward goal Increase Granulation Tissue to: 100 Increase Granulation Tissue - Progress: Progressing toward goal Goals/treatment plan/discharge plan were made with and agreed upon by patient/family: Yes Time For Goal Achievement: 7 days Wound Therapy - Potential for Goals: Good  Goals will be updated until maximal potential achieved or discharge criteria met.  Discharge criteria: when goals achieved, discharge from hospital, MD decision/surgical intervention, no progress towards goals, refusal/missing three consecutive treatments without notification or medical reason.  GP     Thelma Comp 05/30/2019, 10:55 AM   Rolinda Roan, PT, DPT Acute Rehabilitation Services Pager: [phone] Office: [phone]

## 2019-05-30 NOTE — Progress Notes (Signed)
Speech Language Pathology Daily Session Note  Patient Details  Name: Robert [name] MRN: [id number] Date of Birth: December 20, 1950  Today's Date: 05/30/2019 SLP Individual Time: 1403-1500 SLP Individual Time Calculation (min): 57 min  Short Term Goals: Week 2: SLP Short Term Goal 1 (Week 2): Patient will consume current diet with minimal overt s/s of aspiration with Mod I for use of swallowing compensatory strategies. SLP Short Term Goal 2 (Week 2): Patient will demonstrate efficient mastication and complete oral clearance with trials of regular textures over 2 sessions with Mod I for use of swallowing compensatory strategies and without overt s/s of aspiration. SLP Short Term Goal 3 (Week 2): Patient will consume trials of thin liquids via cup with use of a chin tuck without overt s/s of aspiration with suprvision verbal cues over 3 sessions to assess readiness for repeat MBS. SLP Short Term Goal 4 (Week 2): Patient will complete pharyngeal strengthening exercies with supervision verbal cues.  Skilled Therapeutic Interventions:  Pt was seen for skilled ST targeting goals for dysphagia.  Pt consumed therapeutic trials of thin liquids (water) following oral care per the water protocol with no overt s/s of aspiration over ~8 oz and supervision faded to mod I use of swallowing precautions (chin tuck).  Pt's lunch had been delayed as the kitchen had delivered the wrong tray earlier.  As a result, SLP completed skilled observations during presentations of pt's currently prescribed diet after trials of water had ceased.  Pt consumed dys 3 textures and nectar thick liquids with mod I use of swallowing precautions.  Pt had x2 instances of coughing during meal; however, this appeared unrelated to PO intake.  SLP initiated skilled education regarding pharyngeal strengthening exercises. Pt was able to return demonstration of the effortful swallow, Masako, Mendelsohn, and supraglottic swallow with supervision  instructional cues.  A handout was provided to maximize carryover in between therapy sessions.  Pt was left in wheelchair with all needs within reach.  Continue per current plan of care.    Pain Pain Assessment Pain Scale: 0-10 Pain Score: 0-No pain  Therapy/Group: Individual Therapyd  Aydden Cumpian, Selinda Orion 05/30/2019, 3:30 PM

## 2019-05-31 ENCOUNTER — Inpatient Hospital Stay (HOSPITAL_COMMUNITY): Payer: Medicare HMO | Admitting: Occupational Therapy

## 2019-05-31 DIAGNOSIS — N183 Chronic kidney disease, stage 3 unspecified: Secondary | ICD-10-CM

## 2019-05-31 DIAGNOSIS — R1310 Dysphagia, unspecified: Secondary | ICD-10-CM

## 2019-05-31 DIAGNOSIS — E1165 Type 2 diabetes mellitus with hyperglycemia: Secondary | ICD-10-CM

## 2019-05-31 LAB — GLUCOSE, CAPILLARY
Glucose-Capillary: 106 mg/dL — ABNORMAL HIGH (ref 70–99)
Glucose-Capillary: 241 mg/dL — ABNORMAL HIGH (ref 70–99)
Glucose-Capillary: 120 mg/dL — ABNORMAL HIGH (ref 70–99)
Glucose-Capillary: 175 mg/dL — ABNORMAL HIGH (ref 70–99)

## 2019-05-31 NOTE — Progress Notes (Signed)
Physical Therapy Wound Treatment Patient Details  Name: Robert [name] MRN: [id number] Date of Birth: March 12, 1951  Today's Date: 05/31/2019 PT Individual Time: 0900-0940 PT Individual Time Calculation (min): 40 min   Subjective  Subjective: Pt pleasant and agreeable to hydrotherapy. Daughter present for session today. Patient and Family Stated Goals: Heal wound, keep it from getting worse Date of Onset: (Unknown) Prior Treatments: Santyl and wet>dry dressing changes  Pain Score:  Pt with increased pain this session. May want to premedicate for Monday.   Wound Assessment  Pressure Injury 04/17/19 Sacrum Medial Unstageable - Full thickness tissue loss in which the base of the ulcer is covered by slough (yellow, tan, gray, green or brown) and/or eschar (tan, brown or black) in the wound bed. dark purple with some skin sloug (Active)  Dressing Type ABD;Gauze (Comment);Barrier Film (skin prep);Moist to dry 05/31/19 1313  Dressing Changed;Clean;Dry;Intact 05/31/19 1313  Dressing Change Frequency Daily 05/31/19 1313  State of Healing Non-healing 05/31/19 1313  Site / Wound Assessment Yellow;Pink 05/31/19 1313  % Wound base Red or Granulating 5% 05/31/19 1313  % Wound base Yellow/Fibrinous Exudate 95% 05/31/19 1313  % Wound base Black/Eschar 0% 05/31/19 1313  Peri-wound Assessment Intact;Purple 05/31/19 1313  Wound Length (cm) 2 cm 05/27/19 1132  Wound Width (cm) 0.5 cm 05/27/19 1132  Wound Depth (cm) 0 cm 05/27/19 1132  Wound Surface Area (cm^2) 1 cm^2 05/27/19 1132  Wound Volume (cm^3) 0 cm^3 05/27/19 1132  Margins Unattached edges (unapproximated) 05/31/19 1313  Drainage Amount Minimal 05/31/19 1313  Drainage Description Serosanguineous 05/31/19 1313  Treatment Debridement (Selective);Hydrotherapy (Pulse lavage);Packing (Saline gauze) 05/31/19 1313   Santyl applied to wound bed prior to applying dressing.    Pressure Injury 04/17/19 Buttocks Right;Lower Unstageable - Full thickness  tissue loss in which the base of the ulcer is covered by slough (yellow, tan, gray, green or brown) and/or eschar (tan, brown or black) in the wound bed. (Active)  Dressing Type ABD;Gauze (Comment);Barrier Film (skin prep);Moist to dry 05/31/19 1313  Dressing Changed;Clean;Dry;Intact 05/31/19 1313  Dressing Change Frequency Daily 05/31/19 1313  State of Healing Eschar 05/31/19 1313  Site / Wound Assessment Yellow;Pink 05/31/19 1313  % Wound base Red or Granulating 10% 05/31/19 1313  % Wound base Yellow/Fibrinous Exudate 90% 05/31/19 1313  % Wound base Black/Eschar 0% 05/31/19 1313  % Wound base Other/Granulation Tissue (Comment) 0% 05/31/19 1313  Peri-wound Assessment Erythema (blanchable);Pink 05/31/19 1313  Wound Length (cm) 6 cm 05/27/19 1132  Wound Width (cm) 4 cm 05/27/19 1132  Wound Depth (cm) 0 cm 05/27/19 1132  Wound Surface Area (cm^2) 24 cm^2 05/27/19 1132  Wound Volume (cm^3) 0 cm^3 05/27/19 1132  Margins Unattached edges (unapproximated) 05/31/19 1313  Drainage Amount Minimal 05/31/19 1313  Drainage Description Serosanguineous 05/31/19 1313  Treatment Debridement (Selective);Hydrotherapy (Pulse lavage);Packing (Saline gauze) 05/31/19 1313   Santyl applied to wound bed prior to applying dressing.    Pressure Injury 04/17/19 Buttocks Left;Lower Unstageable - Full thickness tissue loss in which the base of the ulcer is covered by slough (yellow, tan, gray, green or brown) and/or eschar (tan, brown or black) in the wound bed. WOC updated to Stage 3 on  (Active)  Dressing Type ABD;Gauze (Comment);Moist to dry;Barrier Film (skin prep) 05/31/19 1313  Dressing Changed;Clean;Dry;Intact 05/31/19 1313  Dressing Change Frequency Daily 05/31/19 1313  State of Healing Non-healing 05/31/19 1313  Site / Wound Assessment Yellow;Pink 05/31/19 1313  % Wound base Red or Granulating 5% 05/31/19 1313  % Wound base Yellow/Fibrinous  Exudate 95% 05/31/19 1313  % Wound base Black/Eschar 0% 05/31/19  1313  Peri-wound Assessment Erythema (blanchable) 05/31/19 1313  Wound Length (cm) 1.5 cm 05/27/19 1132  Wound Width (cm) 0.5 cm 05/27/19 1132  Wound Depth (cm) 0 cm 05/27/19 1132  Wound Surface Area (cm^2) 0.75 cm^2 05/27/19 1132  Wound Volume (cm^3) 0 cm^3 05/27/19 1132  Margins Unattached edges (unapproximated) 05/31/19 1313  Drainage Amount Minimal 05/31/19 1313  Drainage Description Serosanguineous 05/31/19 1313  Treatment Debridement (Selective);Hydrotherapy (Pulse lavage);Packing (Saline gauze) 05/31/19 1313   Santyl applied to wound bed prior to applying dressing.     Hydrotherapy Pulsed lavage therapy - wound location: bilateral buttock wounds and coccyx Pulsed Lavage with Suction (psi): 12 psi Pulsed Lavage with Suction - Normal Saline Used: 1000 mL Pulsed Lavage Tip: Tip with splash shield Selective Debridement Selective Debridement - Location: bilateral buttock wounds and coccyx Selective Debridement - Tools Used: Forceps;Scalpel Selective Debridement - Tissue Removed: Owens Shark and yellow necrotic tissue   Wound Assessment and Plan  Wound Therapy - Assess/Plan/Recommendations Wound Therapy - Clinical Statement: Progressing well with removal of necrotic tissue on R buttock wound. Slough on L buttock wound and coccyx wound starting to soften. Pt's daughter present for session and questions answered. Pt will benefit from continued hydrotherapy for continued selective removal of unviable tissue, to decrease bioburden, and promote wound bed healing.  Wound Therapy - Functional Problem List: Decreased tolerance for position changes due to skin breakdown, decreased mobility in general.  Factors Delaying/Impairing Wound Healing: Diabetes Mellitus;Immobility;Multiple medical problems Hydrotherapy Plan: Debridement;Dressing change;Patient/family education;Pulsatile lavage with suction Wound Therapy - Frequency: 6X / week Wound Therapy - Follow Up Recommendations: Home health RN Wound  Plan: See above  Wound Therapy Goals- Improve the function of patient's integumentary system by progressing the wound(s) through the phases of wound healing (inflammation - proliferation - remodeling) by: Decrease Necrotic Tissue to: 0 Decrease Necrotic Tissue - Progress: Progressing toward goal Increase Granulation Tissue to: 100 Increase Granulation Tissue - Progress: Progressing toward goal Goals/treatment plan/discharge plan were made with and agreed upon by patient/family: Yes Time For Goal Achievement: 7 days Wound Therapy - Potential for Goals: Good  Goals will be updated until maximal potential achieved or discharge criteria met.  Discharge criteria: when goals achieved, discharge from hospital, MD decision/surgical intervention, no progress towards goals, refusal/missing three consecutive treatments without notification or medical reason.  GP     Thelma Comp 05/31/2019, 1:32 PM   Rolinda Roan, PT, DPT Acute Rehabilitation Services Pager: [phone] Office: [phone]

## 2019-05-31 NOTE — Progress Notes (Signed)
At 0117-scan=622ml, I & O cath=977ml. At 0606, bladder scan=569ml, I & O cath=833ml. Wanted RN to leave scheduled neurontin on bedside table, informed patient that we can't do that. "I'll take it later." Robert [name] A

## 2019-05-31 NOTE — Progress Notes (Signed)
Occupational Therapy Session Note  Patient Details  Name: Robert [name] MRN: [id number] Date of Birth: 09-14-1950  Today's Date: 05/31/2019 OT Individual Time: 1046-1200 OT Individual Time Calculation (min): 74 min    Short Term Goals: Week 1:  OT Short Term Goal 1 (Week 1): Pt will complete BSC/toilet transfer with max A with LRAD OT Short Term Goal 2 (Week 1): Pt will thread BLE into pants with AE PRN and MIN A OT Short Term Goal 3 (Week 1): Pt will dof/don socks with AE and no VC to demo improved memory OT Short Term Goal 4 (Week 1): Pt will don shirt with S  Skilled Therapeutic Interventions/Progress Updates:    Upon entering the room, pt supine in bed and agreeable to OT intervention without c/o pain this session. Pt performed supine >sit with supervision. OT setting up wheelchair and pt transferred from bed >wheelchair with min A.  Pt propelled wheelchair to sink for UB bathing with set up A. Pt performed grooming with supervision at sink. Pt donning socks with sock aide and needing assistance to don B shoes and AFO. Pt taking seated rest break. Pt ambulating 300' with RW and min guard progressing to close supervision with min cuing for forward gaze. Pt taking seated rest break once returning to room with O2 at 95% on RA. OT reviewing energy conservation education for self care and functional mobility for discharge. Pt actively engaged in education and asking appropriate questions. Pt remained in wheelchair with call bell and all needed items within reach.   Therapy Documentation Precautions:  Precautions Precautions: Fall Precaution Comments: sacral wound, supplemental oxygen Required Braces or Orthoses: Other Brace Other Brace: B Prevalon boots Restrictions Weight Bearing Restrictions: No General:   Vital Signs: Therapy Vitals Pulse Rate: 96 BP: (!) 145/71 Pain: Pain Assessment Pain Scale: 0-10 Pain Score: 0-No pain ADL: ADL Grooming: Other (comment)(refused) Upper  Body Bathing: Supervision/safety Where Assessed-Upper Body Bathing: Edge of bed Lower Body Bathing: Maximal assistance Where Assessed-Lower Body Bathing: Edge of bed(standing EOB) Upper Body Dressing: Minimal assistance Where Assessed-Upper Body Dressing: Edge of bed Lower Body Dressing: Maximal assistance Where Assessed-Lower Body Dressing: Edge of bed Toileting: Not assessed Toilet Transfer: Dependent(stedy)   Therapy/Group: Individual Therapy  Gypsy Decant 05/31/2019, 12:27 PM

## 2019-05-31 NOTE — Progress Notes (Signed)
[facility] PHYSICAL MEDICINE & REHABILITATION PROGRESS NOTE   Subjective/Complaints: Patient seen sitting up at the edge of his bed this morning.  He states he slept well overnight.  He states he does not like his new mattress either.  He has questions if he can do anything to improve bladder function.  ROS: Denies CP, SOB, N/V/D  Objective:   No results found. No results for input(s): WBC, HGB, HCT, PLT in the last 72 hours. No results for input(s): NA, K, CL, CO2, GLUCOSE, BUN, CREATININE, CALCIUM in the last 72 hours.  Intake/Output Summary (Last 24 hours) at 05/31/2019 1011 Last data filed at 05/31/2019 0730 Gross per 24 hour  Intake 1200 ml  Output 2850 ml  Net -1650 ml     Physical Exam: Vital Signs Blood pressure (!) 145/71, pulse 96, temperature 98.1 F (36.7 C), temperature source Oral, resp. rate 16, height 6' (1.829 m), weight 96 kg, SpO2 95 %. Constitutional: No distress . Vital signs reviewed. HENT: Normocephalic.  Atraumatic. Eyes: EOMI. No discharge. Cardiovascular: No JVD. Respiratory: Normal effort.  No stridor. GI: Non-distended. Skin: Decubitus ulcer not examined today Psych: Normal mood.  Normal behavior. Musc: No edema in extremities.  No tenderness in extremities. Neurological: Alert Motor: Bilateral upper extremities: 4/5 proximal to distal Right lower extremity: 3+/5 hip flexion, 4/5 knee extension, 4/5 ankle dorsiflexion, unchanged Left lower extremity: 3+/5 hip flexion, knee extension, ankle dorsiflexion, unchanged  Assessment/Plan: 1. Functional deficits secondary to debility which require 3+ hours per day of interdisciplinary therapy in a comprehensive inpatient rehab setting.  Physiatrist is providing close team supervision and 24 hour management of active medical problems listed below.  Physiatrist and rehab team continue to assess barriers to discharge/monitor patient progress toward functional and medical goals  Care Tool:  Bathing     Body parts bathed by patient: Right arm, Left arm, Chest, Abdomen, Face   Body parts bathed by helper: Buttocks, Right upper leg, Left upper leg, Right lower leg, Left lower leg     Bathing assist Assist Level: Supervision/Verbal cueing(UB ONLY)     Upper Body Dressing/Undressing Upper body dressing   What is the patient wearing?: Pull over shirt    Upper body assist Assist Level: Set up assist    Lower Body Dressing/Undressing Lower body dressing      What is the patient wearing?: Pants     Lower body assist Assist for lower body dressing: Minimal Assistance - Patient > 75%     Toileting Toileting Toileting Activity did not occur (Clothing management and hygiene only): N/A (no void or bm)  Toileting assist Assist for toileting: Maximal Assistance - Patient 25 - 49%     Transfers Chair/bed transfer  Transfers assist  Chair/bed transfer activity did not occur: Safety/medical concerns  Chair/bed transfer assist level: Minimal Assistance - Patient > 75%     Locomotion Ambulation   Ambulation assist   Ambulation activity did not occur: Safety/medical concerns  Assist level: Minimal Assistance - Patient > 75% Assistive device: Walker-rolling(AFO) Max distance: 180'   Walk 10 feet activity   Assist  Walk 10 feet activity did not occur: Safety/medical concerns  Assist level: Minimal Assistance - Patient > 75% Assistive device: Walker-rolling, Orthosis   Walk 50 feet activity   Assist Walk 50 feet with 2 turns activity did not occur: Safety/medical concerns  Assist level: Minimal Assistance - Patient > 75% Assistive device: Walker-rolling, Orthosis    Walk 150 feet activity   Assist Walk 150  feet activity did not occur: Safety/medical concerns  Assist level: Minimal Assistance - Patient > 75% Assistive device: Walker-rolling, Orthosis    Walk 10 feet on uneven surface  activity   Assist Walk 10 feet on uneven surfaces activity did not occur:  Safety/medical concerns   Assist level: Minimal Assistance - Patient > 75% Assistive device: Walker-rolling, Orthosis   Wheelchair     Assist Will patient use wheelchair at discharge?: (TBD) Type of Wheelchair: Manual Wheelchair activity did not occur: Refused         Wheelchair 50 feet with 2 turns activity    Assist    Wheelchair 50 feet with 2 turns activity did not occur: Refused       Wheelchair 150 feet activity     Assist  Wheelchair 150 feet activity did not occur: Refused       Blood pressure (!) 145/71, pulse 96, temperature 98.1 F (36.7 C), temperature source Oral, resp. rate 16, height 6' (1.829 m), weight 96 kg, SpO2 95 %.  Medical Problem List and Plan: 1.Functional and mobility deficitssecondary to debility after Covid respiratory failure  Continue CIR 2.A fib/Antithrombotics: -DVT/anticoagulation:Pharmaceutical:Other (comment)--Eliquis. -antiplatelet therapy: On Plavix. 3. Pain Management:oxycodone prn. 4. Mood:LCSW to follow for evaluation and support. -antipsychotic agents: N/A 5. Neuropsych: This patientiscapable of making decisions on hisown behalf. 6.Sacral decub/Skin/Wound Care: - air mattress. -Continue Santyl with damp to dry dressings to slough on decubiti  - Hydrotherapy ongoing 7. Fluids/Electrolytes/Nutrition: encourage PO  -continue protein supp for low albumin 8. CAD/ICM: Not surgical candidate. Treated medically with metoprolol, Plavix and lipitor. 9. Hx fluid overload: Monitor for signs of over load and check weights daily. Cont lasix 40mg  po daily    On chronic lasix with normal EF, monitor I/Os intake per pt is less than at home (home lasix  dose 80mg )  Filed Weights   05/28/19 0536 05/29/19 0408 05/30/19 0238  Weight: 102 kg 95.9 kg 96 kg    ?  Improving on 9/18 10 Hypokalemia:  Potassium 4.5 on 9/14  Continue supplement 11. T2DM with  hyperglycemia: Hgb A1c- 7.7. Was on Toujeo, metformin and acarbose PTA. Acarbose added back on 9/08 with novolog for meal coverage.    -hesitate to resume metformin given his borderline Cr  -added amaryl 1mg  daily 9/17  -continue lantus 14u qhs 9/14--consider weaning as orals are titrated     CBG (last 3)  Recent Labs    05/30/19 1711 05/30/19 2136 05/31/19 0620  GLUCAP 189* 175* 106*    Labile on 9/19 12. Anxiety disorder: Provide ego support. Continue Xanax prn.  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 Amil Amen. Stable off methotrexate/ Actemra at this time.  14. Urinary retention:     Continue flomax--0.8mg  qhs  Remains unable to void thus far although has urge to void  -no cholinergics given his CAD -up to toilet or BSC to void  Continue I/O caths, lidocaine for comfort--needs to be applied before each cath 13. COPD: Now on Dulera BID with nebs prn  Supplemental oxygen weaned 14. HTN:     Vitals:   05/31/19 0550 05/31/19 0850  BP: 137/70 (!) 145/71  Pulse: 76 96  Resp: 16   Temp: 98.1 F (36.7 C)   SpO2: 95%    Controlled on 9/19 15. PAF: In NSR normal rate  on metoprolol and Eliquis. 16.  Likely CKD III  Creatinine 1.27 on 9/14, labs ordered  for Monday  Encourage fluids 17.  Anemia  Hemoglobin 8.5 on 9/14, labs ordered for Monday 18.  Dysphagia  D3 nectars, advance diet as tolerated  LOS: 9 days A FACE TO FACE EVALUATION WAS PERFORMED  Sarim Rothman [name] 05/31/2019, 10:11 AM

## 2019-05-31 NOTE — Progress Notes (Signed)
Cath delayed by hydrotherapy session. Patient participated in self-cath. Educated about home catheter types and clean hygiene procedure. Placed and advanced catheter with mod assist from RN. Verbalized that the procedure was "not as bad as I thought" and eager to participate in next session.

## 2019-06-01 ENCOUNTER — Inpatient Hospital Stay (HOSPITAL_COMMUNITY): Payer: Medicare HMO

## 2019-06-01 LAB — GLUCOSE, CAPILLARY
Glucose-Capillary: 132 mg/dL — ABNORMAL HIGH (ref 70–99)
Glucose-Capillary: 199 mg/dL — ABNORMAL HIGH (ref 70–99)
Glucose-Capillary: 139 mg/dL — ABNORMAL HIGH (ref 70–99)
Glucose-Capillary: 176 mg/dL — ABNORMAL HIGH (ref 70–99)

## 2019-06-01 NOTE — Progress Notes (Signed)
I & O cath volumes are still high. Spoke with patient about trying to decrease his PO intake. At 2200, I & O cath=1200cc's. At [place], cath=700cc's. At 0630, I & O cath=600cc's. Offered to assist patient with self caths, "I'll wait until tomorrow." Robert [name] A

## 2019-06-01 NOTE — Progress Notes (Signed)
[facility] PHYSICAL MEDICINE & REHABILITATION PROGRESS NOTE   Subjective/Complaints: Patient seen sitting up at the edge of his bed this morning.  He states he slept well overnight.  He states he was able to ambulate to the gym and back yesterday.  ROS: Denies CP, SOB, N/V/D  Objective:   No results found. No results for input(s): WBC, HGB, HCT, PLT in the last 72 hours. No results for input(s): NA, K, CL, CO2, GLUCOSE, BUN, CREATININE, CALCIUM in the last 72 hours.  Intake/Output Summary (Last 24 hours) at 06/01/2019 1007 Last data filed at 06/01/2019 0801 Gross per 24 hour  Intake 1390 ml  Output 2600 ml  Net -1210 ml     Physical Exam: Vital Signs Blood pressure (!) 108/55, pulse 82, temperature 98.1 F (36.7 C), temperature source Oral, resp. rate 18, height 6' (1.829 m), weight 96 kg, SpO2 98 %. Constitutional: No distress . Vital signs reviewed. HENT: Normocephalic.  Atraumatic. Eyes: EOMI. No discharge. Cardiovascular: No JVD. Respiratory: Normal effort.  No stridor. GI: Non-distended. Skin: Decubitus ulcer not examined today. Psych: Normal mood.  Normal behavior. Musc: No edema in extremities.  No tenderness in extremities. Neurological: Alert Motor: Bilateral upper extremities: 4-4+/5 proximal to distal Right lower extremity: 4/5 hip flexion, 4/5 knee extension, 4/5 ankle dorsiflexion Left lower extremity: 4/5 hip flexion, knee extension, 2-/5 ankle dorsiflexion  Assessment/Plan: 1. Functional deficits secondary to debility which require 3+ hours per day of interdisciplinary therapy in a comprehensive inpatient rehab setting.  Physiatrist is providing close team supervision and 24 hour management of active medical problems listed below.  Physiatrist and rehab team continue to assess barriers to discharge/monitor patient progress toward functional and medical goals  Care Tool:  Bathing    Body parts bathed by patient: Right arm, Left arm, Chest, Abdomen,  Face(UB only)   Body parts bathed by helper: Buttocks, Right upper leg, Left upper leg, Right lower leg, Left lower leg     Bathing assist Assist Level: Set up assist     Upper Body Dressing/Undressing Upper body dressing   What is the patient wearing?: Pull over shirt    Upper body assist Assist Level: Set up assist    Lower Body Dressing/Undressing Lower body dressing      What is the patient wearing?: Pants     Lower body assist Assist for lower body dressing: Minimal Assistance - Patient > 75%     Toileting Toileting Toileting Activity did not occur (Clothing management and hygiene only): N/A (no void or bm)  Toileting assist Assist for toileting: Maximal Assistance - Patient 25 - 49%     Transfers Chair/bed transfer  Transfers assist  Chair/bed transfer activity did not occur: Safety/medical concerns  Chair/bed transfer assist level: Minimal Assistance - Patient > 75%     Locomotion Ambulation   Ambulation assist   Ambulation activity did not occur: Safety/medical concerns  Assist level: Contact Guard/Touching assist Assistive device: Walker-rolling Max distance: 300'   Walk 10 feet activity   Assist  Walk 10 feet activity did not occur: Safety/medical concerns  Assist level: Contact Guard/Touching assist Assistive device: Walker-rolling, Orthosis   Walk 50 feet activity   Assist Walk 50 feet with 2 turns activity did not occur: Safety/medical concerns  Assist level: Contact Guard/Touching assist Assistive device: Walker-rolling, Orthosis    Walk 150 feet activity   Assist Walk 150 feet activity did not occur: Safety/medical concerns  Assist level: Contact Guard/Touching assist Assistive device: Walker-rolling, Orthosis  Walk 10 feet on uneven surface  activity   Assist Walk 10 feet on uneven surfaces activity did not occur: Safety/medical concerns   Assist level: Contact Guard/Touching assist Assistive device:  Walker-rolling, Orthosis   Wheelchair     Assist Will patient use wheelchair at discharge?: (TBD) Type of Wheelchair: Manual Wheelchair activity did not occur: Refused         Wheelchair 50 feet with 2 turns activity    Assist    Wheelchair 50 feet with 2 turns activity did not occur: Refused       Wheelchair 150 feet activity     Assist  Wheelchair 150 feet activity did not occur: Refused       Blood pressure (!) 108/55, pulse 82, temperature 98.1 F (36.7 C), temperature source Oral, resp. rate 18, height 6' (1.829 m), weight 96 kg, SpO2 98 %.  Medical Problem List and Plan: 1.Functional and mobility deficitssecondary to debility after Covid respiratory failure  Continue CIR 2.A fib/Antithrombotics: -DVT/anticoagulation:Pharmaceutical:Other (comment)--Eliquis. -antiplatelet therapy: On Plavix. 3. Pain Management:oxycodone prn. 4. Mood:LCSW to follow for evaluation and support. -antipsychotic agents: N/A 5. Neuropsych: This patientiscapable of making decisions on hisown behalf. 6.Sacral decub/Skin/Wound Care: - air mattress. -Continue Santyl with damp to dry dressings to slough on decubiti  - Hydrotherapy ongoing 7. Fluids/Electrolytes/Nutrition: encourage PO  -continue protein supp for low albumin 8. CAD/ICM: Not surgical candidate. Treated medically with metoprolol, Plavix and lipitor. 9. Hx fluid overload: Monitor for signs of over load and check weights daily. Cont lasix 40mg  po daily    On chronic lasix with normal EF, monitor I/Os intake per pt is less than at home (home lasix  dose 80mg )  Filed Weights   05/28/19 0536 05/29/19 0408 05/30/19 0238  Weight: 102 kg 95.9 kg 96 kg    ?  Improving on 9/18, no repeat weights since then 10 Hypokalemia:  Potassium 4.5 on 9/14  Continue supplement 11. T2DM with hyperglycemia: Hgb A1c- 7.7. Was on Toujeo, metformin and acarbose PTA.  Acarbose added back on 9/08 with novolog for meal coverage.    -hesitate to resume metformin given his borderline Cr  -added amaryl 1mg  daily 9/17  -continue lantus 14u qhs 9/14--consider weaning as orals are titrated     CBG (last 3)  Recent Labs    05/31/19 1703 05/31/19 2117 06/01/19 0631  GLUCAP 120* 175* 132*    Labile 9/20,?  Stabilizing 12. Anxiety disorder: Provide ego support. Continue Xanax prn.  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 Amil Amen. Stable off methotrexate/ Actemra at this time.  14. Urinary retention:     Continue flomax--0.8mg  qhs  Remains unable to void thus far although has urge to void  -no cholinergics given his CAD -up to toilet or BSC to void  Continue I/O caths, lidocaine for comfort--needs to be applied before each cath 13. COPD: Now on Dulera BID with nebs prn  Supplemental oxygen weaned 14. HTN:     Vitals:   06/01/19 0854 06/01/19 0907  BP:  (!) 108/55  Pulse:  82  Resp:    Temp:    SpO2: 98%    Relatively controlled on 9/20 15. PAF: In NSR normal rate  on metoprolol and Eliquis. 16.  Likely CKD III  Creatinine 1.27 on 9/14, labs ordered for tomorrow  Encourage fluids 17.  Anemia  Hemoglobin 8.5 on 9/14, labs ordered for tomorrow 18.  Dysphagia  D3  nectars, advance diet as tolerated  LOS: 10 days A FACE TO FACE EVALUATION WAS PERFORMED   [name] 06/01/2019, 10:07 AM

## 2019-06-01 NOTE — Progress Notes (Signed)
Occupational Therapy Weekly Progress Note  Patient Details  Name: Robert [name] MRN: [id number] Date of Birth: 1951/03/28  Beginning of progress report period: May 23, 2019 End of progress report period: June 01, 2019  Today's Date: 06/01/2019 OT Individual Time: 1010-1110 OT Individual Time Calculation (min): 60 min    Patient has met 4 of 4 short term goals.  Pt has made excellent progress in his OT POC. Pt's endurance has improved and he is able to complete 300+ ft of functional mobility with CGA. Pt is able to dress with CGA and is learning to self-cath. Pt is making faster than expected progress and his d/c will likely be moved up!  Patient continues to demonstrate the following deficits: muscle weakness, decreased cardiorespiratoy endurance, decreased safety awareness and decreased standing balance and therefore will continue to benefit from skilled OT intervention to enhance overall performance with BADL.  Patient progressing toward long term goals..  Continue plan of care.  OT Short Term Goals Week 1:  OT Short Term Goal 1 (Week 1): Pt will complete BSC/toilet transfer with max A with LRAD OT Short Term Goal 1 - Progress (Week 1): Met OT Short Term Goal 2 (Week 1): Pt will thread BLE into pants with AE PRN and MIN A OT Short Term Goal 2 - Progress (Week 1): Met OT Short Term Goal 3 (Week 1): Pt will dof/don socks with AE and no VC to demo improved memory OT Short Term Goal 3 - Progress (Week 1): Met OT Short Term Goal 4 (Week 1): Pt will don shirt with S OT Short Term Goal 4 - Progress (Week 1): Met Week 2:  OT Short Term Goal 1 (Week 2): Pt will don pants with (S) OT Short Term Goal 2 (Week 2): Pt will demonstrate improved endurance by standing to complete 2 grooming tasks with no rest breaks OT Short Term Goal 3 (Week 2): Pt will complete ambulatory bathroom transfer with min A  Skilled Therapeutic Interventions/Progress Updates:    Pt received supine with no c/o  pain. Pt completed bed mobility with (S). All VSS. Pt reporting fatigue and needing more rest breaks EOB prior to mobility OOB. Pt completed stand pivot transfer ot the w.c with (S) using RW. Pt completed oral care and grooming tasks at the sink with (S). Pt used RW to complete functional mobility into the bathroom. He stood with RW over the toilet and attempted to initiate self-catherization. Pt unable to stedy hand enough in standing with simultaneous balance demands and was encouraged to sit down to cath instead. Pt did so and was able to insert catheter with min A. Pt completed 200 ft of functional mobility with RW with 1 seated rest break at 100 ft. Min cueing for widening BOS and L LE management. CGA provided throughout. Pt left sitting up in the w/c with all needs met, chair alarm set.   Therapy Documentation Precautions:  Precautions Precautions: Fall Precaution Comments: sacral wound, supplemental oxygen Required Braces or Orthoses: Other Brace Other Brace: B Prevalon boots Restrictions Weight Bearing Restrictions: No   Therapy/Group: Individual Therapy  Curtis Sites 06/01/2019, 12:26 PM

## 2019-06-01 NOTE — Progress Notes (Signed)
Patient self-cathed with min assist in sitting position supported by RN and OT.

## 2019-06-02 ENCOUNTER — Inpatient Hospital Stay (HOSPITAL_COMMUNITY): Payer: Medicare HMO

## 2019-06-02 ENCOUNTER — Inpatient Hospital Stay (HOSPITAL_COMMUNITY): Payer: Medicare HMO | Admitting: Speech Pathology

## 2019-06-02 LAB — BASIC METABOLIC PANEL
Anion gap: 9 (ref 5–15)
BUN: 27 mg/dL — ABNORMAL HIGH (ref 8–23)
CO2: 27 mmol/L (ref 22–32)
Calcium: 9.2 mg/dL (ref 8.9–10.3)
Chloride: 103 mmol/L (ref 98–111)
Creatinine, Ser: 1.23 mg/dL (ref 0.61–1.24)
GFR calc Af Amer: >60 mL/min (ref >60)
GFR calc non Af Amer: >60 mL/min (ref >60)
Glucose, Bld: 125 mg/dL — ABNORMAL HIGH (ref 70–99)
Potassium: 4.3 mmol/L (ref 3.5–5.1)
Sodium: 139 mmol/L (ref 135–145)

## 2019-06-02 LAB — CBC
HCT: 26.8 % — ABNORMAL LOW (ref 39.0–52.0)
Hemoglobin: 8.4 g/dL — ABNORMAL LOW (ref 13.0–17.0)
MCH: 29.1 pg (ref 26.0–34.0)
MCHC: 31.3 g/dL (ref 30.0–36.0)
MCV: 92.7 fL (ref 80.0–100.0)
Platelets: 220 10*3/uL (ref 150–400)
RBC: 2.89 MIL/uL — ABNORMAL LOW (ref 4.22–5.81)
RDW: 15.3 % (ref 11.5–15.5)
WBC: 6 10*3/uL (ref 4.0–10.5)
nRBC: 0 % (ref 0.0–0.2)

## 2019-06-02 LAB — GLUCOSE, CAPILLARY
Glucose-Capillary: 120 mg/dL — ABNORMAL HIGH (ref 70–99)
Glucose-Capillary: 148 mg/dL — ABNORMAL HIGH (ref 70–99)
Glucose-Capillary: 77 mg/dL (ref 70–99)
Glucose-Capillary: 153 mg/dL — ABNORMAL HIGH (ref 70–99)

## 2019-06-02 MED ORDER — INSULIN GLARGINE 100 UNIT/ML ~~LOC~~ SOLN
8.0000 [IU] | Freq: Every day | SUBCUTANEOUS | Status: DC
  Administered 2019-06-02: 8 [IU] via SUBCUTANEOUS
  Filled 2019-06-02 (×2): qty 0.08

## 2019-06-02 MED ORDER — GLIMEPIRIDE 2 MG PO TABS
2.0000 mg | ORAL_TABLET | Freq: Every day | ORAL | Status: DC
  Administered 2019-06-03 – 2019-06-06 (×4): 2 mg via ORAL
  Filled 2019-06-02 (×4): qty 1

## 2019-06-02 NOTE — Progress Notes (Signed)
[facility] PHYSICAL MEDICINE & REHABILITATION PROGRESS NOTE   Subjective/Complaints: Improving stamina. Walked back and forth to gym this weekend! Unable to void but doing his own I/O caths!  ROS: Patient denies fever, rash, sore throat, blurred vision, nausea, vomiting, diarrhea, cough, shortness of breath or chest pain, joint or back pain, headache, or mood change.    Objective:   No results found. Recent Labs    06/02/19 0702  WBC 6.0  HGB 8.4*  HCT 26.8*  PLT 220   Recent Labs    06/02/19 0702  NA 139  K 4.3  CL 103  CO2 27  GLUCOSE 125*  BUN 27*  CREATININE 1.23  CALCIUM 9.2    Intake/Output Summary (Last 24 hours) at 06/02/2019 1100 Last data filed at 06/02/2019 0600 Gross per 24 hour  Intake 885 ml  Output 1850 ml  Net -965 ml     Physical Exam: Vital Signs Blood pressure 117/75, pulse 73, temperature 98.9 F (37.2 C), temperature source Oral, resp. rate 18, height 6' (1.829 m), weight 96 kg, SpO2 96 %. Constitutional: No distress . Vital signs reviewed. HEENT: EOMI, oral membranes moist Neck: supple Cardiovascular: RRR without murmur. No JVD    Respiratory: CTA Bilaterally without wheezes or rales. Normal effort    GI: BS +, non-tender, non-distended  Skin: large decubitus with 50%granulation. Smaller one almost healed. Psych: Normal mood.  Normal behavior. Musc: No edema in extremities.  No tenderness in extremities. Neurological: Alert Motor: Bilateral upper extremities: 4-4+/5 proximal to distal Right lower extremity: 4/5 hip flexion, 4/5 knee extension, 4/5 ankle dorsiflexion Left lower extremity: 4/5 hip flexion, knee extension, 2-3/5 ankle dorsiflexion  Assessment/Plan: 1. Functional deficits secondary to debility which require 3+ hours per day of interdisciplinary therapy in a comprehensive inpatient rehab setting.  Physiatrist is providing close team supervision and 24 hour management of active medical problems listed below.  Physiatrist  and rehab team continue to assess barriers to discharge/monitor patient progress toward functional and medical goals  Care Tool:  Bathing    Body parts bathed by patient: Right arm, Left arm, Chest, Abdomen, Face, Front perineal area, Buttocks, Right upper leg, Left upper leg   Body parts bathed by helper: Buttocks, Right upper leg, Left upper leg, Right lower leg, Left lower leg     Bathing assist Assist Level: Set up assist     Upper Body Dressing/Undressing Upper body dressing   What is the patient wearing?: Pull over shirt    Upper body assist Assist Level: Set up assist    Lower Body Dressing/Undressing Lower body dressing      What is the patient wearing?: Pants     Lower body assist Assist for lower body dressing: Contact Guard/Touching assist     Toileting Toileting Toileting Activity did not occur (Clothing management and hygiene only): N/A (no void or bm)  Toileting assist Assist for toileting: Contact Guard/Touching assist     Transfers Chair/bed transfer  Transfers assist  Chair/bed transfer activity did not occur: Safety/medical concerns  Chair/bed transfer assist level: Contact Guard/Touching assist     Locomotion Ambulation   Ambulation assist   Ambulation activity did not occur: Safety/medical concerns  Assist level: Contact Guard/Touching assist Assistive device: Walker-rolling(AFO) Max distance: 150'   Walk 10 feet activity   Assist  Walk 10 feet activity did not occur: Safety/medical concerns  Assist level: Contact Guard/Touching assist Assistive device: Walker-rolling, Orthosis   Walk 50 feet activity   Assist Walk 50 feet  with 2 turns activity did not occur: Safety/medical concerns  Assist level: Contact Guard/Touching assist Assistive device: Walker-rolling, Orthosis    Walk 150 feet activity   Assist Walk 150 feet activity did not occur: Safety/medical concerns  Assist level: Contact Guard/Touching  assist Assistive device: Walker-rolling, Orthosis    Walk 10 feet on uneven surface  activity   Assist Walk 10 feet on uneven surfaces activity did not occur: Safety/medical concerns   Assist level: Contact Guard/Touching assist Assistive device: Walker-rolling, Orthosis   Wheelchair     Assist Will patient use wheelchair at discharge?: (TBD) Type of Wheelchair: Manual Wheelchair activity did not occur: Refused         Wheelchair 50 feet with 2 turns activity    Assist    Wheelchair 50 feet with 2 turns activity did not occur: Refused       Wheelchair 150 feet activity     Assist  Wheelchair 150 feet activity did not occur: Refused       Blood pressure 117/75, pulse 73, temperature 98.9 F (37.2 C), temperature source Oral, resp. rate 18, height 6' (1.829 m), weight 96 kg, SpO2 96 %.  Medical Problem List and Plan: 1.Functional and mobility deficitssecondary to debility after Covid respiratory failure  Continue CIR 2.A fib/Antithrombotics: -DVT/anticoagulation:Pharmaceutical:Other (comment)--Eliquis. -antiplatelet therapy: On Plavix. 3. Pain Management:oxycodone prn. 4. Mood:LCSW to follow for evaluation and support. -antipsychotic agents: N/A 5. Neuropsych: This patientiscapable of making decisions on hisown behalf. 6.Sacral decub/Skin/Wound Care: - air mattress. -Continue Santyl with damp to dry dressings to slough on decubiti---can dc wet to dry over smaller dressing  - Hydrotherapy ongoing--good progress! 7. Fluids/Electrolytes/Nutrition: encourage PO  -continue protein supp for low albumin 8. CAD/ICM: Not surgical candidate. Treated medically with metoprolol, Plavix and lipitor. 9. Hx fluid overload: Monitor for signs of over load and check weights daily. Cont lasix 40mg  po daily    On chronic lasix with normal EF, monitor I/Os intake per pt is less than at home (home lasix   dose 80mg )  Filed Weights   05/28/19 0536 05/29/19 0408 05/30/19 0238  Weight: 102 kg 95.9 kg 96 kg    ?  Improving on 9/18, need daily weights 10 Hypokalemia:  Potassium 4.5 on 9/14  Continue supplement 11. T2DM with hyperglycemia: Hgb A1c- 7.7. Was on Toujeo, metformin and acarbose PTA. Acarbose added back on 9/08 with novolog for meal coverage.    -hesitate to resume metformin given his borderline Cr  -added amaryl 1mg  daily 9/17--increase to 2mg  tomorrow  -  lantus 14u qhs 9/14--decrease to 8u     CBG (last 3)  Recent Labs    06/01/19 1649 06/01/19 2115 06/02/19 0650  GLUCAP 139* 176* 120*     12. Anxiety disorder: Provide ego support. Continue Xanax prn.  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 Amil Amen. Stable off methotrexate/ Actemra at this time.  14. Urinary retention:     Continue flomax--0.8mg  qhs  Remains unable to void thus far although has urge to void  -no cholinergics given his CAD -up to toilet or BSC to void  Continue I/O caths, lidocaine for comfort--needs to be applied before each cath  -pt doing self-caths  -refer to outpt urology for follow up 13. COPD: Now on Dulera BID with nebs prn  Supplemental oxygen weaned 14. HTN:     Vitals:   06/02/19 0345 06/02/19 0916  BP: 117/75   Pulse:  73   Resp: 18   Temp: 98.9 F (37.2 C)   SpO2: 94% 96%   Relatively controlled on 9/21 15. PAF: In NSR normal rate  on metoprolol and Eliquis. 16.  Likely CKD III  Creatinine 1.23 9/21  Encourage fluids 17.  Anemia  Hemoglobin 8.4 9/21 18.  Dysphagia  D3 nectars, advance diet as tolerated  LOS: 11 days A FACE TO FACE EVALUATION WAS PERFORMED  Meredith Staggers 06/02/2019, 11:00 AM

## 2019-06-02 NOTE — Progress Notes (Signed)
Speech Language Pathology Daily Session Note  Patient Details  Name: Robert [name] MRN: [id number] Date of Birth: 08/12/1951  Today's Date: 06/02/2019 SLP Individual Time: 1030-1104 SLP Individual Time Calculation (min): 34 min  Short Term Goals: Week 2: SLP Short Term Goal 1 (Week 2): Patient will consume current diet with minimal overt s/s of aspiration with Mod I for use of swallowing compensatory strategies. SLP Short Term Goal 2 (Week 2): Patient will demonstrate efficient mastication and complete oral clearance with trials of regular textures over 2 sessions with Mod I for use of swallowing compensatory strategies and without overt s/s of aspiration. SLP Short Term Goal 3 (Week 2): Patient will consume trials of thin liquids via cup with use of a chin tuck without overt s/s of aspiration with suprvision verbal cues over 3 sessions to assess readiness for repeat MBS. SLP Short Term Goal 4 (Week 2): Patient will complete pharyngeal strengthening exercies with supervision verbal cues.  Skilled Therapeutic Interventions: Pt was seen for skilled ST targeting dysphagia. SLP facilitated session with thin water trials in accordance with [place] Water Protocol. Pt completed thorough oral care with suction independently prior to PO intake. Pt consumed ~10-12 oz of water over 20 trials with no overt s/s aspiration. Pt demonstrated effective use of chin tuck maneuver throughout intake with only 1 verbal cue reminder. Following PO intake, pt verbally recalled and demonstrated 2 pharyngeal strengthening swallow exercises from previous session - the [place] and effortful swallow. SLP recommended pt complete 10 reps of each exercise at least 3 times throughout the day in order to maximize potential for pharyngeal strengthening. Of note, pt did inquire regarding opportunity for another MBS; SLP reminded pt last MBS was completed only 4 days ago, however it is likely SLP will repeat MBS once more closer to  discharge date in order to reassess pharyngeal swallow function and any potential for liquid advancement. Pt was left in wheelchair with seat alarm set and all needs within reach. Continue per current plan of care.       Pain Pain Assessment Pain Scale: 0-10 Pain Score: 0-No pain Faces Pain Scale: No hurt  Therapy/Group: Individual Therapy  Arbutus Leas 06/02/2019, 12:38 PM

## 2019-06-02 NOTE — Progress Notes (Signed)
Physical Therapy Session Note  Patient Details  Name: Robert [name] MRN: [id number] Date of Birth: [birth year]  Today's Date: 06/02/2019 PT Individual Time: 1345-1500 PT Individual Time Calculation (min): 75 min   Short Term Goals: Week 1:  PT Short Term Goal 1 (Week 1): Pt will complete bed mobility consistently with CGA. PT Short Term Goal 1 - Progress (Week 1): Met PT Short Term Goal 2 (Week 1): Pt will complete bed<>w/c transfers with mod assist +1. PT Short Term Goal 2 - Progress (Week 1): Met PT Short Term Goal 3 (Week 1): Pt will ambulate 15 ft with LRAD & max assist +1. PT Short Term Goal 3 - Progress (Week 1): Met PT Short Term Goal 4 (Week 1): Pt will propel w/c x 50 ft with BUE & supervision. PT Short Term Goal 4 - Progress (Week 1): Met Week 2:  PT Short Term Goal 1 (Week 2): Pt will perform sit <.> stands with CGA PT Short Term Goal 2 (Week 2): Pt will perform gait x 150' with CGA PT Short Term Goal 3 (Week 2): Pt will be able to perform 3 steps with min assist for home entry  Skilled Therapeutic Interventions/Progress Updates:    Received pt supine in bed. Pt stated that he has felt tired all day but was agreeable to PT. Today's session focused on LE balance, coordination, proprioception, and strengthening activities inside the parallel bars as well as ambulation and stair navigation. Pt performed tandem walking (forwards and backwards) and lateral side stepping inside parallel bars x4 laps with CGA-Min A for balance with verbal cueing for foot and hand placement and speed. Pt also performed static tandem balance and SLS inside the parallel bars with min A and verbal cues to look ahead in the mirror, hand placement, and weight shifting. Pt performed seated toe touches x 5 reps to decrease low back discomfort from standing. Pt navigated 4 steps x 3 trials min A with verbal cues to slow speed. Pt ambulated 182f with RW CGA with verbal cues to slow pace and focus on L LE placement.  Pt performed sit<>stands x 5 reps EOB with RW to improve LE strength. Pt was fatigued at the end of the session but denied any increase in pain. Nurse tech present at end of session to perform bladder scan.   Therapy Documentation Precautions:  Precautions Precautions: Fall Precaution Comments: sacral wound, L AFO Required Braces or Orthoses: Other Brace Other Brace: B Prevalon boots Restrictions Weight Bearing Restrictions: No     Pain: Pt c/o minor low back discomfort with activity during today's session. Repositioning and exercises were done to alleviate pain.    Therapy/Group: Individual Therapy  AAlfonse AlpersPT, DPT   ALars Masson PT, DPT, CBIS   06/02/2019, 3:08 PM

## 2019-06-02 NOTE — Plan of Care (Signed)
Goals upgraded 2/2 pt progress.   Problem: RH Balance Goal: LTG: Patient will maintain dynamic sitting balance (OT) Description: LTG:  Patient will maintain dynamic sitting balance with assistance during activities of daily living (OT) Flowsheets (Taken 06/02/2019 0808) LTG: Pt will maintain dynamic sitting balance during ADLs with: (goal upgraded 9/21- SD) Independent with assistive device   Problem: Sit to Stand Goal: LTG:  Patient will perform sit to stand in prep for activites of daily living with assistance level (OT) Description: LTG:  Patient will perform sit to stand in prep for activites of daily living with assistance level (OT) Flowsheets (Taken 06/02/2019 0808) LTG: PT will perform sit to stand in prep for activites of daily living with assistance level: (goal upgraded 9/21- SD) Supervision/Verbal cueing   Problem: RH Grooming Goal: LTG Patient will perform grooming w/assist,cues/equip (OT) Description: LTG: Patient will perform grooming with assist, with/without cues using equipment (OT) Flowsheets (Taken 06/02/2019 0808) LTG: Pt will perform grooming with assistance level of: (goal upgraded 9/21- SD) Independent with assistive device    Problem: RH Bathing Goal: LTG Patient will bathe all body parts with assist levels (OT) Description: LTG: Patient will bathe all body parts with assist levels (OT) Flowsheets (Taken 06/02/2019 0808) LTG: Pt will perform bathing with assistance level/cueing: (goal upgraded 9/21- SD) Supervision/Verbal cueing   Problem: RH Dressing Goal: LTG Patient will perform lower body dressing w/assist (OT) Description: LTG: Patient will perform lower body dressing with assist, with/without cues in positioning using equipment (OT) Flowsheets (Taken 06/02/2019 0808) LTG: Pt will perform lower body dressing with assistance level of: (goal upgraded 9/21- SD) Supervision/Verbal cueing   Problem: RH Toileting Goal: LTG Patient will perform toileting task (3/3  steps) with assistance level (OT) Description: LTG: Patient will perform toileting task (3/3 steps) with assistance level (OT)  Flowsheets (Taken 06/02/2019 0808) LTG: Pt will perform toileting task (3/3 steps) with assistance level: (goal upgraded 9/21- SD) Supervision/Verbal cueing   Problem: RH Toilet Transfers Goal: LTG Patient will perform toilet transfers w/assist (OT) Description: LTG: Patient will perform toilet transfers with assist, with/without cues using equipment (OT) Flowsheets (Taken 06/02/2019 0808) LTG: Pt will perform toilet transfers with assistance level of: (goal upgraded 9/21- SD) Supervision/Verbal cueing   Problem: RH Tub/Shower Transfers Goal: LTG Patient will perform tub/shower transfers w/assist (OT) Description: LTG: Patient will perform tub/shower transfers with assist, with/without cues using equipment (OT) Flowsheets (Taken 06/02/2019 0808) LTG: Pt will perform tub/shower stall transfers with assistance level of: (goal upgraded 9/21- SD) Supervision/Verbal cueing LTG: Pt will perform tub/shower transfers from: Tub/shower combination

## 2019-06-02 NOTE — Progress Notes (Signed)
Physical Therapy Session Note  Patient Details  Name: Robert [name] MRN: [id number] Date of Birth: 1950/12/16  Today's Date: 06/02/2019 PT Individual Time: [id number] PT Individual Time Calculation (min): 35 min   Short Term Goals: Week 2:  PT Short Term Goal 1 (Week 2): Pt will perform sit <.> stands with CGA PT Short Term Goal 2 (Week 2): Pt will perform gait x 150' with CGA PT Short Term Goal 3 (Week 2): Pt will be able to perform 3 steps with min assist for home entry  Skilled Therapeutic Interventions/Progress Updates:    PT assisted with donning of shoes and AFO for time management. Session focused on stair negotiation training for home entry practice (pt has picture of entry = 2 steps and then 1 small step with access to both rails) x 4 steps x 2 trials. Pt requires min assist to perform today with more difficulty with ascending than descending. Pt going up with bilateral hand rails and coming down with L rail sideways. Pt requires extended seated rest break due to fatigue. Gait training with RW x 150' with supervision to [place] with improve posture and stride length noted. End of session performed min assist stand pivot without AD and positioned in supine with supervision for hydro.  Therapy Documentation Precautions:  Precautions Precautions: Fall Precaution Comments: sacral wound,L AFO Required Braces or Orthoses: Other Brace Other Brace: B Prevalon boots Restrictions Weight Bearing Restrictions: No  Pain: Pain Assessment Pain Scale: 0-10 Pain Score: 0-No pain Faces Pain Scale: No hurt    Therapy/Group: Individual Therapy  Canary Brim Ivory Broad, PT, DPT, CBIS  06/02/2019, 10:43 AM

## 2019-06-02 NOTE — Progress Notes (Signed)
Physical Therapy Wound Treatment Patient Details  Name: Robert [name] MRN: [id number] Date of Birth: December 02, 1950  Today's Date: 06/02/2019 PT Individual Time:  4098-1191     Subjective  Subjective: Pt pleasant and agreeable to hydrotherapy. Daughter present for session today. Patient and Family Stated Goals: Heal wound, keep it from getting worse Date of Onset: (Unknown) Prior Treatments: Santyl and wet>dry dressing changes  Pain Score: Pt premedicated prior to session beginning.   Wound Assessment  Pressure Injury 04/17/19 Sacrum Medial Unstageable - Full thickness tissue loss in which the base of the ulcer is covered by slough (yellow, tan, gray, green or brown) and/or eschar (tan, brown or black) in the wound bed. dark purple with some skin sloug (Active)  Dressing Type ABD;Barrier Film (skin prep);Gauze (Comment);Moist to dry 06/02/19 1458  Dressing Clean;Dry;Intact;Changed 06/02/19 1458  Dressing Change Frequency Daily 06/02/19 1458  State of Healing Non-healing 06/02/19 1458  Site / Wound Assessment Yellow;Pink 06/02/19 1458  % Wound base Red or Granulating 5% 06/02/19 1458  % Wound base Yellow/Fibrinous Exudate 95% 06/02/19 1458  % Wound base Black/Eschar 0% 06/02/19 1458  Peri-wound Assessment Intact;Purple 06/02/19 1458  Wound Length (cm) 2 cm 05/27/19 1132  Wound Width (cm) 0.5 cm 05/27/19 1132  Wound Depth (cm) 0 cm 05/27/19 1132  Wound Surface Area (cm^2) 1 cm^2 05/27/19 1132  Wound Volume (cm^3) 0 cm^3 05/27/19 1132  Margins Unattached edges (unapproximated) 06/02/19 1458  Drainage Amount Minimal 06/02/19 1458  Drainage Description Serosanguineous 06/02/19 1458  Treatment Debridement (Selective);Hydrotherapy (Pulse lavage);Packing (Saline gauze) 06/02/19 1458   Santyl applied to wound bed prior to applying dressing.    Pressure Injury 04/17/19 Buttocks Right;Lower Unstageable - Full thickness tissue loss in which the base of the ulcer is covered by slough (yellow,  tan, gray, green or brown) and/or eschar (tan, brown or black) in the wound bed. (Active)  Dressing Type ABD;Barrier Film (skin prep);Gauze (Comment);Moist to dry 06/02/19 1458  Dressing Clean;Intact;Dry;Changed 06/02/19 1458  Dressing Change Frequency Daily 06/02/19 1458  State of Healing Eschar 06/02/19 1458  Site / Wound Assessment Yellow;Pink 06/02/19 1458  % Wound base Red or Granulating 30% 06/02/19 1458  % Wound base Yellow/Fibrinous Exudate 70% 06/02/19 1458  % Wound base Black/Eschar 0% 06/02/19 1458  % Wound base Other/Granulation Tissue (Comment) 0% 06/02/19 1458  Peri-wound Assessment Erythema (blanchable);Pink 06/02/19 1458  Wound Length (cm) 6 cm 05/27/19 1132  Wound Width (cm) 4 cm 05/27/19 1132  Wound Depth (cm) 0 cm 05/27/19 1132  Wound Surface Area (cm^2) 24 cm^2 05/27/19 1132  Wound Volume (cm^3) 0 cm^3 05/27/19 1132  Margins Unattached edges (unapproximated) 06/02/19 1458  Drainage Amount None 06/02/19 1458  Drainage Description Serosanguineous 06/02/19 1458  Treatment Debridement (Selective);Hydrotherapy (Pulse lavage);Packing (Saline gauze) 06/02/19 1458   Santyl applied to wound bed prior to applying dressing.    Pressure Injury 04/17/19 Buttocks Left;Lower Unstageable - Full thickness tissue loss in which the base of the ulcer is covered by slough (yellow, tan, gray, green or brown) and/or eschar (tan, brown or black) in the wound bed. WOC updated to Stage 3 on  (Active)  Dressing Type ABD;Barrier Film (skin prep);Gauze (Comment);Moist to dry 06/02/19 1458  Dressing Clean;Dry;Intact;Changed 06/02/19 1458  Dressing Change Frequency Daily 06/02/19 1458  State of Healing Non-healing 06/02/19 1458  Site / Wound Assessment Pink;Yellow 06/02/19 1458  % Wound base Red or Granulating 5% 06/02/19 1458  % Wound base Yellow/Fibrinous Exudate 95% 06/02/19 1458  % Wound base Black/Eschar 0% 06/02/19  1458  Peri-wound Assessment Erythema (blanchable) 06/02/19 1458  Wound  Length (cm) 1.5 cm 05/27/19 1132  Wound Width (cm) 0.5 cm 05/27/19 1132  Wound Depth (cm) 0 cm 05/27/19 1132  Wound Surface Area (cm^2) 0.75 cm^2 05/27/19 1132  Wound Volume (cm^3) 0 cm^3 05/27/19 1132  Margins Unattached edges (unapproximated) 06/02/19 1458  Drainage Amount None 06/02/19 1458  Drainage Description Serosanguineous 06/02/19 1458  Treatment Debridement (Selective);Hydrotherapy (Pulse lavage);Packing (Saline gauze) 06/02/19 1458   Santyl applied to wound bed prior to applying dressing.     Hydrotherapy Pulsed lavage therapy - wound location: bilateral buttock wounds and coccyx Pulsed Lavage with Suction (psi): 12 psi Pulsed Lavage with Suction - Normal Saline Used: 1000 mL Pulsed Lavage Tip: Tip with splash shield Selective Debridement Selective Debridement - Location: bilateral buttock wounds and coccyx Selective Debridement - Tools Used: Forceps;Scalpel Selective Debridement - Tissue Removed: yellow necrotic tissue   Wound Assessment and Plan  Wound Therapy - Assess/Plan/Recommendations Wound Therapy - Clinical Statement: Progressing well with removal of necrotic tissue on R buttock wound. Slough on L buttock wound and coccyx wound starting to soften. Pt's daughter present for session and questions answered. Pt will benefit from continued hydrotherapy for continued selective removal of unviable tissue, to decrease bioburden, and promote wound bed healing.  Wound Therapy - Functional Problem List: Decreased tolerance for position changes due to skin breakdown, decreased mobility in general.  Factors Delaying/Impairing Wound Healing: Diabetes Mellitus;Immobility;Multiple medical problems Hydrotherapy Plan: Debridement;Dressing change;Patient/family education;Pulsatile lavage with suction Wound Therapy - Frequency: 6X / week Wound Therapy - Follow Up Recommendations: Home health RN Wound Plan: See above  Wound Therapy Goals- Improve the function of patient's  integumentary system by progressing the wound(s) through the phases of wound healing (inflammation - proliferation - remodeling) by: Decrease Necrotic Tissue to: 0 Decrease Necrotic Tissue - Progress: Progressing toward goal Increase Granulation Tissue to: 100 Increase Granulation Tissue - Progress: Progressing toward goal Goals/treatment plan/discharge plan were made with and agreed upon by patient/family: Yes Time For Goal Achievement: 7 days Wound Therapy - Potential for Goals: Good  Goals will be updated until maximal potential achieved or discharge criteria met.  Discharge criteria: when goals achieved, discharge from hospital, MD decision/surgical intervention, no progress towards goals, refusal/missing three consecutive treatments without notification or medical reason.  GP     Thelma Comp 06/02/2019, 3:03 PM  Rolinda Roan, PT, DPT Acute Rehabilitation Services Pager: [phone] Office: [phone]

## 2019-06-02 NOTE — Progress Notes (Signed)
Occupational Therapy Session Note  Patient Details  Name: Robert [name] MRN: [id number] Date of Birth: August 09, 1951  Today's Date: 06/02/2019 OT Individual Time: 0730-0830 OT Individual Time Calculation (min): 60 min    Short Term Goals: Week 2:  OT Short Term Goal 1 (Week 2): Pt will don pants with (S) OT Short Term Goal 2 (Week 2): Pt will demonstrate improved endurance by standing to complete 2 grooming tasks with no rest breaks OT Short Term Goal 3 (Week 2): Pt will complete ambulatory bathroom transfer with min A  Skilled Therapeutic Interventions/Progress Updates:    Pt received supine with no c/o pain. Pt transitioned to EOB and his breakfast tray was delivered. Pt ate D3 meal with no overt 2/2 of aspiration. Pt adhered to all swallowing precautions set by SLP. Discussed home set up and reviewed home measurement with pt, including need for AE in the shower. Recommend TTB for tub use. Thickened pt a diet coke per request to nectar thick consistency. Pt completed short distance functional mobility with RW with close (S). Pt completed UB and LB bathing at the sink with (S). Pt donned shirt with set up assist. Pt able to ask for appropriate AE to assist with LB dressing. CGA for LB dressing. No changes in vital signs or SOB throughout session. Discussed with pt and MD and pt's CGA LT goals were upgraded to (S) and mod I overall. Pt attempted to complete standing level urination but was unable to void. Pt was taken down to the tub room and given demo re use of TTB- pt agrees best for use at home. Pt left sitting up in the w/c with all needs met.   Therapy Documentation Precautions:  Precautions Precautions: Fall Precaution Comments: sacral wound, supplemental oxygen Required Braces or Orthoses: Other Brace Other Brace: B Prevalon boots Restrictions Weight Bearing Restrictions: No  Therapy/Group: Individual Therapy  Curtis Sites 06/02/2019, 7:07 AM

## 2019-06-03 ENCOUNTER — Inpatient Hospital Stay (HOSPITAL_COMMUNITY): Payer: Medicare HMO | Admitting: Occupational Therapy

## 2019-06-03 ENCOUNTER — Inpatient Hospital Stay (HOSPITAL_COMMUNITY): Payer: Medicare HMO

## 2019-06-03 ENCOUNTER — Inpatient Hospital Stay (HOSPITAL_COMMUNITY): Payer: Medicare HMO | Admitting: Speech Pathology

## 2019-06-03 LAB — OCCULT BLOOD X 1 CARD TO LAB, STOOL: Fecal Occult Bld: NEGATIVE

## 2019-06-03 LAB — GLUCOSE, CAPILLARY
Glucose-Capillary: 118 mg/dL — ABNORMAL HIGH (ref 70–99)
Glucose-Capillary: 172 mg/dL — ABNORMAL HIGH (ref 70–99)
Glucose-Capillary: 115 mg/dL — ABNORMAL HIGH (ref 70–99)
Glucose-Capillary: 172 mg/dL — ABNORMAL HIGH (ref 70–99)

## 2019-06-03 MED ORDER — COLLAGENASE 250 UNIT/GM EX OINT
TOPICAL_OINTMENT | Freq: Every day | CUTANEOUS | Status: DC
  Administered 2019-06-04 – 2019-06-06 (×3): via TOPICAL
  Filled 2019-06-03: qty 30

## 2019-06-03 NOTE — Progress Notes (Signed)
Occupational Therapy Session Note  Patient Details  Name: Robert [name] MRN: [id number] Date of Birth: October 28, 1950  Today's Date: 06/03/2019 OT Individual Time: [id number] OT Individual Time Calculation (min): 75 min    Short Term Goals: Week 2:  OT Short Term Goal 1 (Week 2): Pt will don pants with (S) OT Short Term Goal 2 (Week 2): Pt will demonstrate improved endurance by standing to complete 2 grooming tasks with no rest breaks OT Short Term Goal 3 (Week 2): Pt will complete ambulatory bathroom transfer with min A  Skilled Therapeutic Interventions/Progress Updates:    Treatment session with focus on dynamic standing balance during self-care tasks.  Pt received supine in bed reporting tired but agreeable to therapy session.  Pt completed bed mobility with supervision and engaged in self-feeding seated at EOB.  Maintained sitting balance without any LOB.  Provided pt with diet coke thickened to nectar consistency per SLP/diet orders.  Pt ambulated to sink with RW with supervision standing from elevated EOB.  Pt engaged in bathing and grooming tasks with supervision with exception of requiring assist to don shoes.  Pt ambulated 150' with RW with supervision to CGA when fatigued.  Engaged in corn hole toss in standing, incorporating trunk rotation and stooping to pick bean bags from board.  Pt required min assist when bending to retrieve.  Pt reports increased difficulty and surprised at how difficult picking up items from (near) floor was.  Returned to room and ambulated to toilet with RW.  Pt left seated on toilet with RN alerted of pt position.   Therapy Documentation Precautions:  Precautions Precautions: Fall Precaution Comments: sacral wound, supplemental oxygen Required Braces or Orthoses: Other Brace Other Brace: B Prevalon boots Restrictions Weight Bearing Restrictions: No Vital Signs: Oxygen Therapy SpO2: 95 % O2 Device: Room Air Pain: Pain Assessment Pain Scale: 0-10 Pain  Score: 0-No pain   Therapy/Group: Individual Therapy  Simonne Come 06/03/2019, 9:06 AM

## 2019-06-03 NOTE — Progress Notes (Addendum)
Physical Therapy Wound Treatment Patient Details  Name: Robert [name] MRN: [id number] Date of Birth: [birth year]  Today's Date: 06/03/2019 PT Individual Time: 0940-1020 PT Individual Time Calculation (min): 40 min   Subjective  Subjective: Pt pleasant and agreeable to hydrotherapy.  Patient and Family Stated Goals: Heal wound, keep it from getting worse Date of Onset: (Unknown) Prior Treatments: Santyl and wet>dry dressing changes  Pain Score: Premedicated. Minimal pain reported throughout session.   Wound Assessment  Pressure Injury 04/17/19 Sacrum Medial Unstageable - Full thickness tissue loss in which the base of the ulcer is covered by slough (yellow, tan, gray, green or brown) and/or eschar (tan, brown or black) in the wound bed. dark purple with some skin sloug (Active)  Wound Image   06/03/19 1338  Dressing Type ABD;Barrier Film (skin prep);Gauze (Comment);Moist to dry 06/03/19 1338  Dressing Clean;Dry;Intact 06/03/19 1338  Dressing Change Frequency Daily 06/03/19 1338  State of Healing Non-healing 06/03/19 1338  Site / Wound Assessment Yellow;Pink 06/03/19 1338  % Wound base Red or Granulating 20% 06/03/19 1338  % Wound base Yellow/Fibrinous Exudate 80% 06/03/19 1338  % Wound base Black/Eschar 0% 06/03/19 1338  Peri-wound Assessment Intact;Purple 06/03/19 1338  Wound Length (cm) 2 cm 05/27/19 1132  Wound Width (cm) 0.5 cm 05/27/19 1132  Wound Depth (cm) 0 cm 05/27/19 1132  Wound Surface Area (cm^2) 1 cm^2 05/27/19 1132  Wound Volume (cm^3) 0 cm^3 05/27/19 1132  Margins Unattached edges (unapproximated) 06/03/19 1338  Drainage Amount Minimal 06/03/19 1338  Drainage Description Serosanguineous 06/03/19 1338  Treatment Debridement (Selective);Hydrotherapy (Pulse lavage);Packing (Saline gauze) 06/03/19 1338   Santyl applied to wound bed prior to applying dressing.    Pressure Injury 04/17/19 Buttocks Right;Lower Unstageable - Full thickness tissue loss in which the base of  the ulcer is covered by slough (yellow, tan, gray, green or brown) and/or eschar (tan, brown or black) in the wound bed. (Active)  Wound Image   06/03/19 1338  Dressing Type ABD;Barrier Film (skin prep);Gauze (Comment);Moist to dry 06/03/19 1338  Dressing Clean;Intact;Dry 06/03/19 1338  Dressing Change Frequency Daily 06/03/19 1338  State of Healing Eschar 06/03/19 1338  Site / Wound Assessment Yellow;Pink 06/03/19 1338  % Wound base Red or Granulating 30% 06/03/19 1338  % Wound base Yellow/Fibrinous Exudate 70% 06/03/19 1338  % Wound base Black/Eschar 0% 06/03/19 1338  % Wound base Other/Granulation Tissue (Comment) 0% 06/03/19 1338  Peri-wound Assessment Erythema (blanchable);Pink 06/03/19 1338  Wound Length (cm) 6 cm 05/27/19 1132  Wound Width (cm) 4 cm 05/27/19 1132  Wound Depth (cm) 0.5 cm 06/03/19 1338  Wound Surface Area (cm^2) 24 cm^2 05/27/19 1132  Wound Volume (cm^3) 0 cm^3 05/27/19 1132  Margins Unattached edges (unapproximated) 06/03/19 1338  Drainage Amount None 06/03/19 1338  Drainage Description Serosanguineous 06/03/19 1338  Treatment Debridement (Selective);Hydrotherapy (Pulse lavage);Packing (Saline gauze) 06/03/19 1338   Santyl applied to wound bed prior to applying dressing.    Pressure Injury 04/17/19 Buttocks Left;Lower Unstageable - Full thickness tissue loss in which the base of the ulcer is covered by slough (yellow, tan, gray, green or brown) and/or eschar (tan, brown or black) in the wound bed. WOC updated to Stage 3 on  (Active)  Wound Image   06/03/19 1338  Dressing Type ABD;Barrier Film (skin prep);Gauze (Comment);Moist to dry 06/03/19 1338  Dressing Clean;Dry;Intact 06/03/19 1338  Dressing Change Frequency Daily 06/03/19 1338  State of Healing Non-healing 06/03/19 1338  Site / Wound Assessment Pink;Yellow 06/03/19 1338  % Wound base Red  or Granulating 50% 06/03/19 1338  % Wound base Yellow/Fibrinous Exudate 50% 06/03/19 1338  % Wound base Black/Eschar 0%  06/03/19 1338  Peri-wound Assessment Erythema (blanchable) 06/03/19 1338  Wound Length (cm) 1.5 cm 05/27/19 1132  Wound Width (cm) 0.5 cm 05/27/19 1132  Wound Depth (cm) 0 cm 05/27/19 1132  Wound Surface Area (cm^2) 0.75 cm^2 05/27/19 1132  Wound Volume (cm^3) 0 cm^3 05/27/19 1132  Margins Unattached edges (unapproximated) 06/03/19 1338  Drainage Amount None 06/03/19 1338  Drainage Description Serosanguineous 06/03/19 1338  Treatment Debridement (Selective);Hydrotherapy (Pulse lavage);Packing (Saline gauze) 06/03/19 1338   Santyl applied to wound bed prior to applying dressing.     Hydrotherapy Pulsed lavage therapy - wound location: bilateral buttock wounds and coccyx Pulsed Lavage with Suction (psi): 12 psi Pulsed Lavage with Suction - Normal Saline Used: 1000 mL Pulsed Lavage Tip: Tip with splash shield Selective Debridement Selective Debridement - Location: bilateral buttock wounds and coccyx Selective Debridement - Tools Used: Forceps;Scalpel Selective Debridement - Tissue Removed: yellow necrotic tissue   Wound Assessment and Plan  Wound Therapy - Assess/Plan/Recommendations Wound Therapy - Clinical Statement: Progressing well with removal of necrotic tissue on R buttock wound. Slough on L buttock wound and coccyx wound starting to soften. Pt's daughter present for session and questions answered. Pt will benefit from continued hydrotherapy for continued selective removal of unviable tissue, to decrease bioburden, and promote wound bed healing.  Wound Therapy - Functional Problem List: Decreased tolerance for position changes due to skin breakdown, decreased mobility in general.  Factors Delaying/Impairing Wound Healing: Diabetes Mellitus;Immobility;Multiple medical problems Hydrotherapy Plan: Debridement;Dressing change;Patient/family education;Pulsatile lavage with suction Wound Therapy - Frequency: 6X / week Wound Therapy - Follow Up Recommendations: Home health RN Wound Plan:  See above  Wound Therapy Goals- Improve the function of patient's integumentary system by progressing the wound(s) through the phases of wound healing (inflammation - proliferation - remodeling) by: Decrease Necrotic Tissue to: 0 Decrease Necrotic Tissue - Progress: Progressing toward goal Increase Granulation Tissue to: 100 Increase Granulation Tissue - Progress: Progressing toward goal Goals/treatment plan/discharge plan were made with and agreed upon by patient/family: Yes Time For Goal Achievement: 7 days Wound Therapy - Potential for Goals: Good  Goals will be updated until maximal potential achieved or discharge criteria met.  Discharge criteria: when goals achieved, discharge from hospital, MD decision/surgical intervention, no progress towards goals, refusal/missing three consecutive treatments without notification or medical reason.  GP     Thelma Comp 06/03/2019, 1:48 PM

## 2019-06-03 NOTE — Progress Notes (Signed)
Speech Language Pathology Daily Session Note  Patient Details  Name: Robert [name] MRN: [id number] Date of Birth: 30-Sep-1950  Today's Date: 06/03/2019 SLP Individual Time: [id number] SLP Individual Time Calculation (min): 50 min  Short Term Goals: Week 2: SLP Short Term Goal 1 (Week 2): Patient will consume current diet with minimal overt s/s of aspiration with Mod I for use of swallowing compensatory strategies. SLP Short Term Goal 2 (Week 2): Patient will demonstrate efficient mastication and complete oral clearance with trials of regular textures over 2 sessions with Mod I for use of swallowing compensatory strategies and without overt s/s of aspiration. SLP Short Term Goal 3 (Week 2): Patient will consume trials of thin liquids via cup with use of a chin tuck without overt s/s of aspiration with suprvision verbal cues over 3 sessions to assess readiness for repeat MBS. SLP Short Term Goal 4 (Week 2): Patient will complete pharyngeal strengthening exercies with supervision verbal cues.  Skilled Therapeutic Interventions: Skilled treatment session focused on dysphagia goals. SLP facilitated session by introducing the EMST 150 device. Patient completed 25 repetitions with supervision verbal cues for accuracy and a self-percieved effort level of 4/10, however, it appeared patient was utilizing more effort than what he perceived.  Patient also performed pharyngeal strengthening exercises X 10 with overall Mod I. Patient performed oral care via the suction toothbrush and consumed trials of thin liquids via cup with use of a chin tuck without overt s/s of aspiration. Recommend continued trials with SLP. Patient educated on current swallowing function and participated in discharge planning in regards to moving up his date due to physical and cognitive progress. He verbalized understanding and also called his daughter to schedule family education for Friday.  Patient left upright in the bed with all needs  within reach. Continue with current plan of care.      Pain No/Denies Pain  Therapy/Group: Individual Therapy  [name] 06/03/2019, 2:57 PM

## 2019-06-03 NOTE — Progress Notes (Signed)
Physical Therapy Session Note  Patient Details  Name: Robert [name] MRN: [id number] Date of Birth: 1950-10-06  Today's Date: 06/03/2019 PT Individual Time: 1103-1200 PT Individual Time Calculation (min): 57 min   Short Term Goals: Week 1:  PT Short Term Goal 1 (Week 1): Pt will complete bed mobility consistently with CGA. PT Short Term Goal 1 - Progress (Week 1): Met PT Short Term Goal 2 (Week 1): Pt will complete bed<>w/c transfers with mod assist +1. PT Short Term Goal 2 - Progress (Week 1): Met PT Short Term Goal 3 (Week 1): Pt will ambulate 15 ft with LRAD & max assist +1. PT Short Term Goal 3 - Progress (Week 1): Met PT Short Term Goal 4 (Week 1): Pt will propel w/c x 50 ft with BUE & supervision. PT Short Term Goal 4 - Progress (Week 1): Met Week 2:  PT Short Term Goal 1 (Week 2): Pt will perform sit <.> stands with CGA PT Short Term Goal 2 (Week 2): Pt will perform gait x 150' with CGA PT Short Term Goal 3 (Week 2): Pt will be able to perform 3 steps with min assist for home entry  Skilled Therapeutic Interventions/Progress Updates:    Received pt in bathroom with RN, daughter present during today's session. Pt was agreeable to PT. Today's session focused on family/pt education with primary ADL's including gait, steps, car transfers, and functional transfers. Pt navigated 4 steps x 1 trial CGA with minor verbal cues for step sequence and hand placement to push from [facility]. Pt then navigated 4 steps x 1 trial with daughter. Daughter was educated on assist level, hand placement, step sequence, and to reinforce pt to take his time with activity. O2 sat was 92% and HR 111 bpm on room air. Pt ambulated 331f with RW CGA with verbal reminders to rest if/when needed. Pt performed car transfer x 1 trial CGA with verbal cues to remain close to RW and for hand placement prior to sitting. Pt performed sit<>stand using rehab apartment recliner using RW min A (Pt's recliner is much higher than the one  used today). Pt also performed tub transfer in rehab apartment using tub bench and RW CGA. Pt's daughter initially expressed concern for this activity at home, but after completing it in therapy, expressed confidence with task. Pt's daughter was educated on guarding and hand placement with stair navigation, assistance required with car transfers, safety during ambulation, and setup in bathroom at home for a tub transfer. Con[facility] session [facility], all needs within reach, seatbelt/chair alarm off per safety plan. Pt and daughter verbalized confidence with activities performed today.   Therapy Documentation Precautions:  Precautions Precautions: Fall Precaution Comments: sacral wound, L AFO Required Braces or Orthoses: Other Brace Other Brace: B Prevalon boots Restrictions Weight Bearing Restrictions: No   Pain:  Pt denied any pain during today's session.       Therapy/Group: Individual Therapy  AAlfonse AlpersPT, DPT    ALars Masson PT, DPT, CBIS 12:48 PM  06/03/2019, 12:10 PM

## 2019-06-03 NOTE — Progress Notes (Signed)
[facility] PHYSICAL MEDICINE & REHABILITATION PROGRESS NOTE   Subjective/Complaints: Up with therapy. Still no spontaneous voids. Doing I/O caths however.   ROS: Patient denies fever, rash, sore throat, blurred vision, nausea, vomiting, diarrhea, cough, shortness of breath or chest pain,  headache, or mood change.   Objective:   No results found. Recent Labs    06/02/19 0702  WBC 6.0  HGB 8.4*  HCT 26.8*  PLT 220   Recent Labs    06/02/19 0702  NA 139  K 4.3  CL 103  CO2 27  GLUCOSE 125*  BUN 27*  CREATININE 1.23  CALCIUM 9.2    Intake/Output Summary (Last 24 hours) at 06/03/2019 1058 Last data filed at 06/03/2019 0853 Gross per 24 hour  Intake 720 ml  Output 3775 ml  Net -3055 ml     Physical Exam: Vital Signs Blood pressure 118/76, pulse 70, temperature 98 F (36.7 C), temperature source Oral, resp. rate 15, height 6' (1.829 m), weight 100.5 kg, SpO2 95 %. Constitutional: No distress . Vital signs reviewed. HEENT: EOMI, oral membranes moist Neck: supple Cardiovascular: RRR without murmur. No JVD    Respiratory: CTA Bilaterally without wheezes or rales. Normal effort    GI: BS +, non-tender, non-distended  Skin: large decubitus on sacrum. dressed Psych: pleasant and cooperative Musc: No edema in extremities.  No tenderness in extremities. Neurological: Alert Motor: Bilateral upper extremities: 4-4+/5 proximal to distal Right lower extremity: 4/5 hip flexion, 4/5 knee extension, 4/5 ankle dorsiflexion Left lower extremity: 4/5 hip flexion, knee extension,  3-5 ankle dorsiflexion  Assessment/Plan: 1. Functional deficits secondary to debility which require 3+ hours per day of interdisciplinary therapy in a comprehensive inpatient rehab setting.  Physiatrist is providing close team supervision and 24 hour management of active medical problems listed below.  Physiatrist and rehab team continue to assess barriers to discharge/monitor patient progress toward  functional and medical goals  Care Tool:  Bathing    Body parts bathed by patient: Right arm, Left arm, Chest, Abdomen, Face   Body parts bathed by helper: Buttocks, Right upper leg, Left upper leg, Right lower leg, Left lower leg     Bathing assist Assist Level: Set up assist     Upper Body Dressing/Undressing Upper body dressing   What is the patient wearing?: Pull over shirt    Upper body assist Assist Level: Set up assist    Lower Body Dressing/Undressing Lower body dressing      What is the patient wearing?: Pants     Lower body assist Assist for lower body dressing: Contact Guard/Touching assist     Toileting Toileting Toileting Activity did not occur (Clothing management and hygiene only): N/A (no void or bm)  Toileting assist Assist for toileting: Contact Guard/Touching assist     Transfers Chair/bed transfer  Transfers assist  Chair/bed transfer activity did not occur: Safety/medical concerns  Chair/bed transfer assist level: Supervision/Verbal cueing     Locomotion Ambulation   Ambulation assist   Ambulation activity did not occur: Safety/medical concerns  Assist level: Contact Guard/Touching assist Assistive device: Orthosis Max distance: 171ft   Walk 10 feet activity   Assist  Walk 10 feet activity did not occur: Safety/medical concerns  Assist level: Contact Guard/Touching assist Assistive device: Walker-rolling, Orthosis   Walk 50 feet activity   Assist Walk 50 feet with 2 turns activity did not occur: Safety/medical concerns  Assist level: Contact Guard/Touching assist Assistive device: Walker-rolling, Orthosis    Walk 150 feet activity  Assist Walk 150 feet activity did not occur: Safety/medical concerns  Assist level: Contact Guard/Touching assist Assistive device: Walker-rolling, Orthosis    Walk 10 feet on uneven surface  activity   Assist Walk 10 feet on uneven surfaces activity did not occur: Safety/medical  concerns   Assist level: Contact Guard/Touching assist Assistive device: Walker-rolling, Orthosis   Wheelchair     Assist Will patient use wheelchair at discharge?: (TBD) Type of Wheelchair: Manual Wheelchair activity did not occur: Refused         Wheelchair 50 feet with 2 turns activity    Assist    Wheelchair 50 feet with 2 turns activity did not occur: Refused       Wheelchair 150 feet activity     Assist  Wheelchair 150 feet activity did not occur: Refused       Blood pressure 118/76, pulse 70, temperature 98 F (36.7 C), temperature source Oral, resp. rate 15, height 6' (1.829 m), weight 100.5 kg, SpO2 95 %.  Medical Problem List and Plan: 1.Functional and mobility deficitssecondary to debility after Covid respiratory failure  Continue CIR, team conference today  -Move up ELOS to 9/26 potentially 2.A fib/Antithrombotics: -DVT/anticoagulation:Pharmaceutical:Other (comment)--Eliquis. -antiplatelet therapy: On Plavix. 3. Pain Management:oxycodone prn. 4. Mood:LCSW to follow for evaluation and support. -antipsychotic agents: N/A 5. Neuropsych: This patientiscapable of making decisions on hisown behalf. 6.Sacral decub/Skin/Wound Care: - air mattress. -Continue Santyl with damp to dry dressings to slough on larger decubitus  - Hydrotherapy ongoing--good progress! 7. Fluids/Electrolytes/Nutrition: encourage PO  -continue protein supp for low albumin 8. CAD/ICM: Not surgical candidate. Treated medically with metoprolol, Plavix and lipitor. 9. Hx fluid overload: Monitor for signs of over load and check weights daily. Cont lasix 40mg  po daily    On chronic lasix with normal EF, monitor I/Os intake per pt is less than at home (home lasix  dose 80mg )  Filed Weights   05/29/19 0408 05/30/19 0238 06/03/19 0251  Weight: 95.9 kg 96 kg 100.5 kg      weight up substantially today. Question  accuracy. Looks euvolemic 10 Hypokalemia:  Potassium 4.3 on 9/21  Continue supplement 11. T2DM with hyperglycemia: Hgb A1c- 7.7. Was on Toujeo, metformin and acarbose PTA. Acarbose added back on 9/08 with novolog for meal coverage.    -hesitate to resume metformin given his borderline Cr  -added amaryl 1mg  daily 9/17--increased to 2mg  9/22  -  lantus 14u qhs 9/14--decreased to 8u last night---stop entirely tonight.   -dc mealtime insulin next     CBG (last 3)  Recent Labs    06/02/19 1719 06/02/19 2104 06/03/19 0557  GLUCAP 77 153* 118*     12. Anxiety disorder: Provide ego support. Continue Xanax prn.  -scheduled 0.5mg  xanax at hs to help with sleep/breathing  -is  Improving with functional and medical gains 13. RA bilateral knees/hips: Managed by Dr. Amil Amen. Stable off methotrexate/ Actemra at this time.  14. Urinary retention:     Continue flomax--0.8mg  qhs  Remains unable to void thus far although has urge to void  -no cholinergics given his CAD -up to toilet or BSC to void  Continue I/O caths, lidocaine for comfort--needs to be applied before each cath  -pt doing self-caths  -refer to outpt urology for follow up 13. COPD: Now on Dulera BID with nebs prn  Supplemental oxygen weaned 14. HTN:     Vitals:   06/03/19 0251 06/03/19 0819  BP: 118/76   Pulse: 70   Resp: 15  Temp: 98 F (36.7 C)   SpO2: 93% 95%   Relatively controlled on 9/21 15. PAF: In NSR normal rate  on metoprolol and Eliquis. 16.  Likely CKD III  Creatinine 1.23 9/21  Encourage fluids 17.  Anemia  Hemoglobin 8.4 9/21 18.  Dysphagia  D3 nectars, advance diet as tolerated  LOS: 12 days A FACE TO FACE EVALUATION WAS PERFORMED  Meredith Staggers 06/03/2019, 10:58 AM

## 2019-06-03 NOTE — Patient Care Conference (Addendum)
Inpatient RehabilitationTeam Conference and Plan of Care Update Date: 06/03/2019   Time: 10:10 AM    Patient Name: Robert [name]      Medical Record Number: [id number]  Date of Birth: Nov 27, 1950 Sex: Male         Room/Bed: 4W18C/4W18C-01 Payor Info: Payor: [employer] MEDICARE / Plan: [place] HMO / Product Type: *No Product type* /    Admit Date/Time:  05/22/2019  6:54 PM  Primary Diagnosis:  Physical debility  Patient Active Problem List   Diagnosis Date Noted  . CKD (chronic kidney disease), stage III ([place])   . Controlled type 2 diabetes mellitus with hyperglycemia, without long-term current use of insulin ([place])   . Urinary retention   . 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 Labile blood glucose   . Controlled type 2 diabetes mellitus with hyperglycemia, with long-term current use of insulin ([place])   . Hypokalemia   . Physical debility 05/22/2019  . Demand ischemia ([place]) 05/20/2019  . Flash pulmonary edema ([place]) 05/20/2019  . Stage II decubitus ulcer ([place]) 05/18/2019  . Aspiration pneumonia of both lower lobes due to gastric secretions ([place]) 05/15/2019  . MRSA pneumonia ([place]) 05/15/2019  . Severe sepsis with septic shock ([place]) 05/15/2019  . NSTEMI (non-ST elevated myocardial infarction) ([place]) 05/15/2019  . Staphylococcal pneumonia ([place]) 04/20/2019  . HCAP (healthcare-associated pneumonia) 04/20/2019  . Pneumonia due to COVID-19 virus 04/18/2019  . COPD (chronic obstructive pulmonary disease) with emphysema ([place]) 04/18/2019  . AKI (acute kidney injury) ([place]) 04/18/2019  . Diabetes mellitus type 2, uncontrolled, with complications ([place]) 25/01/3975  . Pressure injury of skin 04/18/2019  . Atrial fibrillation with RVR ([place]) 04/15/2019  . COVID-19 virus detected 04/12/2019  . Acute respiratory distress syndrome (ARDS) due to COVID-19 virus 04/12/2019  . Hypoxia   . Sepsis ([place])   . PAD (peripheral artery disease) ([place]) 03/27/2019  . Pulmonary edema 08/27/2018  . Acute  respiratory failure with hypoxemia ([place]) 08/27/2018  . Chronic combined systolic and diastolic heart failure ([place]) 08/27/2018  . Dyslipidemia (high LDL; low HDL) 03/29/2018  . Dysphagia 01/02/2018  . Diarrhea 01/02/2018  . Family history of colon cancer 01/02/2018  . Ischemic cardiomyopathy   . Insulin dependent diabetes mellitus ([place])   . Hypertension   . History of MI (myocardial infarction)   . GERD (gastroesophageal reflux disease)   . Full dentures   . Coronary artery disease   . Anxiety   . Acute ST elevation myocardial infarction (STEMI) of inferior wall ([place]) 06/06/2017  . Varicose veins of left lower extremity with complications 73/41/9379  . Cellulitis of left leg without foot   . Right radial fracture 12/11/2016  . T3 vertebral fracture ([place]) 12/11/2016  . Fall 12/09/2016  . Thrombocytopenia ([place]) 10/09/2016  . Left leg cellulitis 10/08/2016  . Diabetes ([place]) 10/08/2016  . Inguinal hernia 05/12/2016  . Incarcerated umbilical hernia 02/40/9735  . Chronic combined systolic and diastolic CHF (congestive heart failure) ([place]) 04/25/2016  . High cholesterol   . Controlled type 2 diabetes mellitus with diabetic peripheral angiopathy without gangrene, with long-term current use of insulin ([place])   . Essential hypertension   . Non-STEMI (non-ST elevated myocardial infarction) ([place])   . CAD S/P percutaneous coronary angioplasty   . Unstable angina ([place]) 03/20/2015  . ICM-EF 35% at cath 02/01/15 02/02/2015  . DM type 2 causing vascular disease, not at goal [facility]) 05/29/2014  . Dyspnea 10/20/2013  . COPD (chronic obstructive pulmonary disease) ([place]) 09/03/2013  .  Hyperlipidemia   . Old myocardial infarction 09/01/2013  . Abnormal nuclear stress test 05/17/2012  . S/P CFX PTCAI for ISR 02/01/15 05/17/2012  . PVD, chronic LLE 12/22/2011  . Rheumatoid arthritis ([place]) 12/22/2011  . Contrast media allergy 12/22/2011  . Tobacco abuse 12/21/2011    Expected Discharge Date: Expected  Discharge Date: 06/07/19  Team Members Present: Physician leading conference: Dr. Alger Simons Social Worker Present: Ovidio Kin, LCSW Nurse Present: Dorthula Nettles, RN PT Present: Lavone Nian, PT OT Present: Laverle Hobby, OT SLP Present: Weston [name], SLP PPS Coordinator present : Gunnar Fusi, SLP     Current Status/Progress Goal Weekly Team Focus  Bowel/Bladder   continenet of bowel, in/out cath Q4-5 hrs, self cath doing well, LBM 9/21  remain continent/ continue self caths  continue cath training   Swallow/Nutrition/ Hydration   Dys. 3 textures with nectar-thick liquids, Water protocol, Full Supervision  Mod I  trials of regular textures, trials of thin liquids   ADL's   (S) UB ADLS, CGA LB ADLs, CGA- (S) transfers overall. Endurance has improved significantly.  Goals upgraded to (S)- mod I overall  functional activity tolerance, ADL transfers, dynamic standing balance, ADL retraining   Mobility   supervsion bed mobility, CGA for transfers and gait > 150', min assist stairs  upgraded to supervision overall except CGA (stairs, car, and furniture transfer)  endurance, strengthening, stair negotiation, gait, balance retraining   Communication             Safety/Cognition/ Behavioral Observations  Mod I  Mod I  Goals Met   Pain   no c/o pain, has tylenol prn  pain scale <4/10  assess & treat as needed   Skin   unstageable ulcers to bil buttocks & sacrum/ being treated by PT  no new skin break down  assess q shift      *See Care Plan and progress notes for long and short-term goals.     Barriers to Discharge  Current Status/Progress Possible Resolutions Date Resolved   Nursing                  PT        no barriers noted           OT                  SLP                SW                Discharge Planning/Teaching Needs:  HOme with adult children who are planning on providing 24 hr care. Will need family education prior to DC home      Team  Discussion:  PT and OT have upgraded goals to supervision-CGA. Doing I & O caths. Medically progressing nicely in therapies. Endurance greatly improving. MD trying to get off insulin and on oral agents for diabetes. Will need family education prior to DC home. Speech possible MBS to upgrade diet currently on Dys 2 nectar thick diet.   Revisions to Treatment Plan:  DC 9/26    Medical Summary Current Status: improving strength. not urinating but has been self-cathing. wounds improving with hydrotherapy Weekly Focus/Goal: see medical progress notes  Barriers to Discharge: Medical stability;Wound care   Possible Resolutions to Barriers: ongoing mgt of medical issues above   Continued Need for Acute Rehabilitation Level of Care: The patient requires daily medical management by a physician with specialized training in  physical medicine and rehabilitation for the following reasons: Direction of a multidisciplinary physical rehabilitation program to maximize functional independence : Yes Medical management of patient stability for increased activity during participation in an intensive rehabilitation regime.: Yes Analysis of laboratory values and/or radiology reports with any subsequent need for medication adjustment and/or medical intervention. : Yes   I attest that I was present, lead the team conference, and concur with the assessment and plan of the team. Teleconference held due to COVID 19   Ramsha Lonigro, Gardiner Rhyme 06/03/2019, 2:44 PM

## 2019-06-03 NOTE — Progress Notes (Signed)
Social Work Patient ID: Robert [name], male   DOB: 23-Jun-1951, 68 y.o.   MRN: [id number]  Met with pt to discuss team conference progress toward his goals and the possibility of DC Sat 9/26. Courtney-SP feels this is appropriate and MD feels will be medically stable for DC then. Will need family training prior to DC, speech to set up with daughter. Work on discharge date.

## 2019-06-04 ENCOUNTER — Inpatient Hospital Stay (HOSPITAL_COMMUNITY): Payer: Medicare HMO

## 2019-06-04 ENCOUNTER — Inpatient Hospital Stay (HOSPITAL_COMMUNITY): Payer: Medicare HMO | Admitting: Speech Pathology

## 2019-06-04 ENCOUNTER — Inpatient Hospital Stay (HOSPITAL_COMMUNITY): Payer: Medicare HMO | Admitting: Physical Therapy

## 2019-06-04 LAB — GLUCOSE, CAPILLARY
Glucose-Capillary: 131 mg/dL — ABNORMAL HIGH (ref 70–99)
Glucose-Capillary: 156 mg/dL — ABNORMAL HIGH (ref 70–99)
Glucose-Capillary: 87 mg/dL (ref 70–99)
Glucose-Capillary: 156 mg/dL — ABNORMAL HIGH (ref 70–99)

## 2019-06-04 NOTE — Progress Notes (Signed)
Physical Therapy Session Note  Patient Details  Name: Robert [name] MRN: [id number] Date of Birth: 1950-11-06  Today's Date: 06/04/2019 PT Individual Time: 1410-1505 PT Individual Time Calculation (min): 55 min   Short Term Goals: Week 2:  PT Short Term Goal 1 (Week 2): Pt will perform sit <.> stands with CGA PT Short Term Goal 2 (Week 2): Pt will perform gait x 150' with CGA PT Short Term Goal 3 (Week 2): Pt will be able to perform 3 steps with min assist for home entry  Skilled Therapeutic Interventions/Progress Updates:  Pt received sitting on toilet with RN present, and agreeable to PT. Gait through room to sink with SUpervision assist and RW for pt to perform hand hygiene.   [facility]. Variable gait training over various surfaces including concrete sidewalk( 13f and 2073f and grass (1003fwith CGA for safety. Min cues for AD management and stride length to improve safety. CGA for step up/down curb with min cues for AD management.   Standing BLE therex sit<>stand 3 x 5. Mini squats 4 x 10. Cues for posture, UE placement improved hip hinge to prevent stress on knees, and full ROM to maximize strengthening aspect of movement. Sit<>stand performed with CGA-min assist throughout treatment depending on fatigue leve.   Patient returned to room and [facility] and all needs met.             Therapy Documentation Precautions:  Precautions Precautions: Fall Precaution Comments: sacral wound, supplemental oxygen Required Braces or Orthoses: Other Brace Other Brace: B Prevalon boots Restrictions Weight Bearing Restrictions: No    Vital Signs: Therapy Vitals Temp: 97.8 F (36.6 C) Temp Source: Oral Pulse Rate: 74 Resp: 20 BP: (!) 112/58 Patient Position (if appropriate): Sitting Oxygen Therapy SpO2: 96 % O2 Device: Room Air Pain: denies   Therapy/Group: Individual Therapy  AusLorie Phenix23/2020, 4:46 PM

## 2019-06-04 NOTE — Progress Notes (Signed)
Social Work Patient ID: Robert [name], male   DOB: 1951/01/01, 68 y.o.   MRN: [id number]  Met wit pt and daughter to discuss education going well and need for equipment. Pt reports he has a walker and will find a tub bench so no equipment needed. Will set up home health follow up. Daughter aware will need 24 hr care at discharge on Friday.

## 2019-06-04 NOTE — Progress Notes (Signed)
Physical Therapy Wound Treatment Patient Details  Name: Robert [name] MRN: [id number] Date of Birth: Aug 24, 1951  Today's Date: 06/04/2019 PT Individual Time: 0930-1006 PT Individual Time Calculation (min): 36 min   Subjective  Subjective: Pt pleasant and agreeable to hydrotherapy.  Patient and Family Stated Goals: Heal wound, keep it from getting worse Date of Onset: (Unknown) Prior Treatments: Santyl and wet>dry dressing changes  Pain Score:  RN gave pain meds during session. Pt tolerated well without complaints of pain.   Wound Assessment  Pressure Injury 04/17/19 Sacrum Medial Unstageable - Full thickness tissue loss in which the base of the ulcer is covered by slough (yellow, tan, gray, green or brown) and/or eschar (tan, brown or black) in the wound bed. dark purple with some skin sloug (Active)  Dressing Type ABD;Barrier Film (skin prep);Gauze (Comment);Moist to dry 06/04/19 1440  Dressing Clean;Dry;Intact;Changed 06/04/19 1440  Dressing Change Frequency Daily 06/04/19 1440  State of Healing Non-healing 06/04/19 1440  Site / Wound Assessment Yellow;Pink 06/04/19 1440  % Wound base Red or Granulating 25% 06/04/19 1440  % Wound base Yellow/Fibrinous Exudate 75% 06/04/19 1440  % Wound base Black/Eschar 0% 06/04/19 1440  Peri-wound Assessment Intact;Purple 06/04/19 1440  Wound Length (cm) 2 cm 05/27/19 1132  Wound Width (cm) 0.5 cm 05/27/19 1132  Wound Depth (cm) 0 cm 05/27/19 1132  Wound Surface Area (cm^2) 1 cm^2 05/27/19 1132  Wound Volume (cm^3) 0 cm^3 05/27/19 1132  Margins Unattached edges (unapproximated) 06/04/19 1440  Drainage Amount Minimal 06/04/19 1440  Drainage Description Serosanguineous 06/04/19 1440  Treatment Debridement (Selective);Hydrotherapy (Pulse lavage);Packing (Saline gauze) 06/04/19 1440   Santyl applied to wound bed prior to applying dressing.    Pressure Injury 04/17/19 Buttocks Right;Lower Unstageable - Full thickness tissue loss in which the base  of the ulcer is covered by slough (yellow, tan, gray, green or brown) and/or eschar (tan, brown or black) in the wound bed. (Active)  Dressing Type ABD;Barrier Film (skin prep);Gauze (Comment);Moist to dry 06/04/19 1440  Dressing Clean;Intact;Dry;Changed 06/04/19 1440  Dressing Change Frequency Daily 06/04/19 1440  State of Healing Eschar 06/04/19 1440  Site / Wound Assessment Yellow;Pink 06/04/19 1440  % Wound base Red or Granulating 40% 06/04/19 1440  % Wound base Yellow/Fibrinous Exudate 60% 06/04/19 1440  % Wound base Black/Eschar 0% 06/04/19 1440  % Wound base Other/Granulation Tissue (Comment) 0% 06/04/19 1440  Peri-wound Assessment Erythema (blanchable);Pink 06/04/19 1440  Wound Length (cm) 6 cm 05/27/19 1132  Wound Width (cm) 4 cm 05/27/19 1132  Wound Depth (cm) 0.5 cm 06/03/19 1338  Wound Surface Area (cm^2) 24 cm^2 05/27/19 1132  Wound Volume (cm^3) 0 cm^3 05/27/19 1132  Margins Unattached edges (unapproximated) 06/04/19 1440  Drainage Amount None 06/04/19 1440  Drainage Description Serosanguineous 06/04/19 1440  Treatment Debridement (Selective);Hydrotherapy (Pulse lavage);Packing (Saline gauze) 06/04/19 1440   Santyl applied to wound bed prior to applying dressing.    Pressure Injury 04/17/19 Buttocks Left;Lower Unstageable - Full thickness tissue loss in which the base of the ulcer is covered by slough (yellow, tan, gray, green or brown) and/or eschar (tan, brown or black) in the wound bed. WOC updated to Stage 3 on  (Active)  Dressing Type ABD;Barrier Film (skin prep);Gauze (Comment);Moist to dry 06/04/19 1440  Dressing Clean;Dry;Intact;Changed 06/04/19 1440  Dressing Change Frequency Daily 06/04/19 1440  State of Healing Non-healing 06/04/19 1440  Site / Wound Assessment Pink;Yellow 06/04/19 1440  % Wound base Red or Granulating 25% 06/04/19 1440  % Wound base Yellow/Fibrinous Exudate 75% 06/04/19  1440  % Wound base Black/Eschar 0% 06/04/19 1440  Peri-wound Assessment  Erythema (blanchable) 06/04/19 1440  Wound Length (cm) 1.5 cm 05/27/19 1132  Wound Width (cm) 0.5 cm 05/27/19 1132  Wound Depth (cm) 0 cm 05/27/19 1132  Wound Surface Area (cm^2) 0.75 cm^2 05/27/19 1132  Wound Volume (cm^3) 0 cm^3 05/27/19 1132  Margins Unattached edges (unapproximated) 06/04/19 1440  Drainage Amount None 06/04/19 1440  Drainage Description Serosanguineous 06/04/19 1440  Treatment Debridement (Selective);Hydrotherapy (Pulse lavage);Packing (Saline gauze) 06/04/19 1440   Santyl applied to wound bed prior to applying dressing.     Hydrotherapy Pulsed lavage therapy - wound location: bilateral buttock wounds and coccyx Pulsed Lavage with Suction (psi): 12 psi Pulsed Lavage with Suction - Normal Saline Used: 1000 mL Pulsed Lavage Tip: Tip with splash shield Selective Debridement Selective Debridement - Location: bilateral buttock wounds and coccyx Selective Debridement - Tools Used: Forceps;Scalpel Selective Debridement - Tissue Removed: yellow necrotic tissue   Wound Assessment and Plan  Wound Therapy - Assess/Plan/Recommendations Wound Therapy - Clinical Statement: Progressing well with removal of necrotic tissue on R buttock wound. Slough on L buttock wound and coccyx wound starting to soften. Pt will benefit from continued hydrotherapy for continued selective removal of unviable tissue, to decrease bioburden, and promote wound bed healing.  Wound Therapy - Functional Problem List: Decreased tolerance for position changes due to skin breakdown, decreased mobility in general.  Factors Delaying/Impairing Wound Healing: Diabetes Mellitus;Immobility;Multiple medical problems Hydrotherapy Plan: Debridement;Dressing change;Patient/family education;Pulsatile lavage with suction Wound Therapy - Frequency: 6X / week Wound Therapy - Follow Up Recommendations: Home health RN Wound Plan: See above  Wound Therapy Goals- Improve the function of patient's integumentary system by  progressing the wound(s) through the phases of wound healing (inflammation - proliferation - remodeling) by: Decrease Necrotic Tissue to: 0 Decrease Necrotic Tissue - Progress: Progressing toward goal Increase Granulation Tissue to: 100 Increase Granulation Tissue - Progress: Progressing toward goal Goals/treatment plan/discharge plan were made with and agreed upon by patient/family: Yes Time For Goal Achievement: 7 days Wound Therapy - Potential for Goals: Good  Goals will be updated until maximal potential achieved or discharge criteria met.  Discharge criteria: when goals achieved, discharge from hospital, MD decision/surgical intervention, no progress towards goals, refusal/missing three consecutive treatments without notification or medical reason.  GP     Thelma Comp 06/04/2019, 2:44 PM   Rolinda Roan, PT, DPT Acute Rehabilitation Services Pager: [phone] Office: [phone]

## 2019-06-04 NOTE — Consult Note (Signed)
[place] Nurse wound follow up Patient receiving care in [facility] 4W18.  Assessment of wounds made in conjunction with PT just before initiation of hydrotherapy. The wounds to the right and left buttock are cleaning up nicely with hydrotherapy and Santyl.  I recommend that the daily application of Santyl topped with saline moistened gauze to the right buttock and the very tiny linear wound at the top of the gluteal cleft.  The left buttock would be easily cared for with a small foam dressing, changed every 3 days and prn.  It is almost completely healed today. Val Riles, RN, MSN, CWOCN, CNS-BC, pager [phone]

## 2019-06-04 NOTE — Progress Notes (Signed)
Occupational Therapy Session Note  Patient Details  Name: Robert [name] MRN: [id number] Date of Birth: 26-Oct-1950  Today's Date: 06/04/2019 OT Individual Time: [id number] OT Individual Time Calculation (min): 72 min    Short Term Goals: Week 1:  OT Short Term Goal 1 (Week 1): Pt will complete BSC/toilet transfer with max A with LRAD OT Short Term Goal 1 - Progress (Week 1): Met OT Short Term Goal 2 (Week 1): Pt will thread BLE into pants with AE PRN and MIN A OT Short Term Goal 2 - Progress (Week 1): Met OT Short Term Goal 3 (Week 1): Pt will dof/don socks with AE and no VC to demo improved memory OT Short Term Goal 3 - Progress (Week 1): Met OT Short Term Goal 4 (Week 1): Pt will don shirt with S OT Short Term Goal 4 - Progress (Week 1): Met  Skilled Therapeutic Interventions/Progress Updates:    1;1. Pt received asleep in bed agreeable to tx requesting to eat first with set up and no need for cues while consuming dys 3 textures and thickened liquids (thick coke provided by OT). Pt with no c/o pain and requesting to was up at sink level. Pt sit to stand from low bed with RW and CGA. Pt transfers to w/c at sink with RW and S. Pt bathes UB with setup and grooms at sink with MOD I seated in w/c. Pt dresses with set up for UB and threading BLE into pants with AE. Pt requires CS for sit to stand when advancing pants past hips. Pt propels w/c to tx gym with set up. Pt educated on shoe button use and crossing into figure 4 to don shoes with shoe buttons. Exited session with pt seated in w/c, call light in reach and all needs met  Therapy Documentation Precautions:  Precautions Precautions: Fall Precaution Comments: sacral wound, supplemental oxygen Required Braces or Orthoses: Other Brace Other Brace: B Prevalon boots Restrictions Weight Bearing Restrictions: No General:   Vital Signs: Therapy Vitals Temp: 98.9 F (37.2 C) Temp Source: Oral Pulse Rate: 75 Resp: 16 BP:  122/69 Patient Position (if appropriate): Sitting Oxygen Therapy SpO2: 92 % O2 Device: Room Air Pain: Pain Assessment Pain Scale: 0-10 Pain Score: 5  Pain Type: Acute pain Pain Location: Back Pain Radiating Towards: shoulders Pain Descriptors / Indicators: Aching;Throbbing Pain Frequency: Occasional Pain Onset: Awakened from sleep Pain Intervention(s): Medication (See eMAR) ADL: ADL Grooming: Other (comment)(refused) Upper Body Bathing: Supervision/safety Where Assessed-Upper Body Bathing: Edge of bed Lower Body Bathing: Maximal assistance Where Assessed-Lower Body Bathing: Edge of bed(standing EOB) Upper Body Dressing: Minimal assistance Where Assessed-Upper Body Dressing: Edge of bed Lower Body Dressing: Maximal assistance Where Assessed-Lower Body Dressing: Edge of bed Toileting: Not assessed Toilet Transfer: Dependent(stedy) Vision   Perception    Praxis   Exercises:   Other Treatments:     Therapy/Group: Individual Therapy  Tonny Branch 06/04/2019, 7:18 AM

## 2019-06-04 NOTE — Progress Notes (Addendum)
Speech Language Pathology Daily Session Note  Patient Details  Name: Robert [name] MRN: [id number] Date of Birth: December 02, 1950  Today's Date: 06/04/2019 SLP Individual Time: 1100-1155 SLP Individual Time Calculation (min): 55 min  Short Term Goals: Week 2: SLP Short Term Goal 1 (Week 2): Patient will consume current diet with minimal overt s/s of aspiration with Mod I for use of swallowing compensatory strategies. SLP Short Term Goal 2 (Week 2): Patient will demonstrate efficient mastication and complete oral clearance with trials of regular textures over 2 sessions with Mod I for use of swallowing compensatory strategies and without overt s/s of aspiration. SLP Short Term Goal 3 (Week 2): Patient will consume trials of thin liquids via cup with use of a chin tuck without overt s/s of aspiration with suprvision verbal cues over 3 sessions to assess readiness for repeat MBS. SLP Short Term Goal 4 (Week 2): Patient will complete pharyngeal strengthening exercies with supervision verbal cues.  Skilled Therapeutic Interventions: Skilled treatment session focused on dysphagia goals. SLP facilitated session by providing supervision level verbal cues for patient to complete 25 repetitions of EMST at 30 cm H20 with a self-perceived effort level of 8/10. Patient also performed pharyngeal strengthening exercises with overall Mod I. Patient consumed trials of thin liquids with a chin tuck after oral care without overt s/s of aspiration. However, subtle and intermittent overt s/s of aspiration were noted with lunch meal of Dys. 3 textures with nectar-thick liquids, suspect due to residuals. Recommend patient continue current diet with plan for repeat MBS tomorrow to assess for possible upgrade. Patient left upright in bed with alarm on and all needs within reach. Continue with current plan of care.      Pain No/Denies Pain   Therapy/Group: Individual Therapy  [name] 06/04/2019, 12:15 PM

## 2019-06-04 NOTE — Progress Notes (Signed)
Social Work Discharge Note   The overall goal for the admission was met for: DC SAT 9/26  Discharge location: Yes-HOME  WITH ADULT CHILDREN WHO CAN PROVIDE 24 HR CARE  Length of Stay: Yes-16 DAYS  Discharge activity level: Yes-CGA LEVEL  Home/community participation: Yes  Services provided included: MD, RD, PT, OT, SLP, RN, CM, Pharmacy, Neuropsych and SW  Financial Services: Private Insurance: [employer] MEDICARE  Follow-up services arranged: [facility]-PT,OT,SP,RN-NO PREFERENCE AND NO EQUIPMENT NEEDS. PT TO GET ON HIS OWN  Comments (or additional information):DAUGHTER CAME IN FOR EDUCATION AND IT WENT WELL BOTH FEEL READY FOR DC SAT.  Patient/Family verbalized understanding of follow-up arrangements: Yes  Individual responsible for coordination of the follow-up plan: SELF & DAUGHTER  Confirmed correct DME delivered: ,  G 06/04/2019    ,  G 

## 2019-06-04 NOTE — Progress Notes (Signed)
[facility] PHYSICAL MEDICINE & REHABILITATION PROGRESS NOTE   Subjective/Complaints:  no issues overnite   ROS: Patient deniesnausea, vomiting, diarrhea, cough, shortness of breath or chest pain,   Objective:   No results found. Recent Labs    06/02/19 0702  WBC 6.0  HGB 8.4*  HCT 26.8*  PLT 220   Recent Labs    06/02/19 0702  NA 139  K 4.3  CL 103  CO2 27  GLUCOSE 125*  BUN 27*  CREATININE 1.23  CALCIUM 9.2    Intake/Output Summary (Last 24 hours) at 06/04/2019 0856 Last data filed at 06/04/2019 0735 Gross per 24 hour  Intake 1120 ml  Output 3725 ml  Net -2605 ml     Physical Exam: Vital Signs Blood pressure 122/69, pulse 75, temperature 98.9 F (37.2 C), temperature source Oral, resp. rate 16, height 6' (1.829 m), weight 102.4 kg, SpO2 95 %. Constitutional: No distress . Vital signs reviewed. HEENT: EOMI, oral membranes moist Neck: supple Cardiovascular: RRR without murmur. No JVD    Respiratory: CTA Bilaterally without wheezes or rales. Normal effort    GI: BS +, non-tender, non-distended  Skin: large decubitus on sacrum. dressed Psych: pleasant and cooperative Musc: No edema in extremities.  No tenderness in extremities. Neurological: Alert Motor: Bilateral upper extremities: 4-4+/5 proximal to distal Right lower extremity: 4/5 hip flexion, 4/5 knee extension, 4/5 ankle dorsiflexion Left lower extremity: 4/5 hip flexion, knee extension,  3-5 ankle dorsiflexion  Assessment/Plan: 1. Functional deficits secondary to debility which require 3+ hours per day of interdisciplinary therapy in a comprehensive inpatient rehab setting.  Physiatrist is providing close team supervision and 24 hour management of active medical problems listed below.  Physiatrist and rehab team continue to assess barriers to discharge/monitor patient progress toward functional and medical goals  Care Tool:  Bathing    Body parts bathed by patient: Right arm, Left arm, Chest,  Abdomen, Face   Body parts bathed by helper: Buttocks, Right upper leg, Left upper leg, Right lower leg, Left lower leg     Bathing assist Assist Level: Set up assist     Upper Body Dressing/Undressing Upper body dressing   What is the patient wearing?: Pull over shirt    Upper body assist Assist Level: Set up assist    Lower Body Dressing/Undressing Lower body dressing      What is the patient wearing?: Pants     Lower body assist Assist for lower body dressing: Contact Guard/Touching assist     Toileting Toileting Toileting Activity did not occur (Clothing management and hygiene only): N/A (no void or bm)  Toileting assist Assist for toileting: Contact Guard/Touching assist     Transfers Chair/bed transfer  Transfers assist  Chair/bed transfer activity did not occur: Safety/medical concerns  Chair/bed transfer assist level: Supervision/Verbal cueing     Locomotion Ambulation   Ambulation assist   Ambulation activity did not occur: Safety/medical concerns  Assist level: Contact Guard/Touching assist Assistive device: Walker-rolling Max distance: 323ft   Walk 10 feet activity   Assist  Walk 10 feet activity did not occur: Safety/medical concerns  Assist level: Contact Guard/Touching assist Assistive device: Walker-rolling, Orthosis   Walk 50 feet activity   Assist Walk 50 feet with 2 turns activity did not occur: Safety/medical concerns  Assist level: Contact Guard/Touching assist Assistive device: Walker-rolling, Orthosis    Walk 150 feet activity   Assist Walk 150 feet activity did not occur: Safety/medical concerns  Assist level: Contact Guard/Touching assist Assistive  device: Walker-rolling, Orthosis    Walk 10 feet on uneven surface  activity   Assist Walk 10 feet on uneven surfaces activity did not occur: Safety/medical concerns   Assist level: Contact Guard/Touching assist Assistive device: Walker-rolling, Orthosis    Wheelchair     Assist Will patient use wheelchair at discharge?: (TBD) Type of Wheelchair: Manual Wheelchair activity did not occur: Refused         Wheelchair 50 feet with 2 turns activity    Assist    Wheelchair 50 feet with 2 turns activity did not occur: Refused       Wheelchair 150 feet activity     Assist  Wheelchair 150 feet activity did not occur: Refused       Blood pressure 122/69, pulse 75, temperature 98.9 F (37.2 C), temperature source Oral, resp. rate 16, height 6' (1.829 m), weight 102.4 kg, SpO2 95 %.  Medical Problem List and Plan: 1.Functional and mobility deficitssecondary to debility after Covid respiratory failure  Continue CIR, team conference today  -Move up ELOS to 9/26 potentially 2.A fib/Antithrombotics: -DVT/anticoagulation:Pharmaceutical:Other (comment)--Eliquis. -antiplatelet therapy: On Plavix. 3. Pain Management:oxycodone prn. 4. Mood:LCSW to follow for evaluation and support. -antipsychotic agents: N/A 5. Neuropsych: This patientiscapable of making decisions on hisown behalf. 6.Sacral decub/Skin/Wound Care: - air mattress. -Continue Santyl with damp to dry dressings to slough on larger decubitus  - Hydrotherapy ongoing--good progress! 7. Fluids/Electrolytes/Nutrition: encourage PO  -continue protein supp for low albumin 8. CAD/ICM: Not surgical candidate. Treated medically with metoprolol, Plavix and lipitor. 9. Hx fluid overload: Monitor for signs of over load and check weights daily. Cont lasix 40mg  po daily no current signs although the wt looks up    On chronic lasix with normal EF, monitor I/Os intake per pt is less than at home (home lasix  dose 80mg )  Filed Weights   05/30/19 0238 06/03/19 0251 06/04/19 0506  Weight: 96 kg 100.5 kg 102.4 kg      weight up substantially today. Question accuracy. Looks euvolemic 10 Hypokalemia:  Potassium 4.3  on 9/21  Continue supplement 11. T2DM with hyperglycemia: Hgb A1c- 7.7. Was on Toujeo, metformin and acarbose PTA. Acarbose added back on 9/08 with novolog for meal coverage.    -hesitate to resume metformin given his borderline Cr  -added amaryl 1mg  daily 9/17--increased to 2mg  9/22  -  lantus 14u qhs 9/14--decreased to 8u last night---stop entirely tonight.   -dc mealtime insulin next     CBG (last 3)  Recent Labs    06/03/19 1704 06/03/19 2120 06/04/19 0643  GLUCAP 115* 172* 131*    Controlled 9/23 12. Anxiety disorder: Provide ego support. Continue Xanax prn.  -scheduled 0.5mg  xanax at hs to help with sleep/breathing  -is  Improving with functional and medical gains 13. RA bilateral knees/hips: Managed by Dr. Amil Amen. Stable off methotrexate/ Actemra at this time.  14. Urinary retention:     Continue flomax--0.8mg  qhs  Remains unable to void thus far although has urge to void  -no cholinergics given his CAD -up to toilet or BSC to void  Continue I/O caths, lidocaine for comfort--needs to be applied before each cath  -pt doing self-caths  -refer to outpt urology for follow up 13. COPD: Now on Dulera BID with nebs prn  Supplemental oxygen weaned 14. HTN:     Vitals:   06/04/19 0506 06/04/19 0741  BP: 122/69   Pulse: 75   Resp: 16   Temp: 98.9 F (37.2 C)   SpO2:  92% 95%   Relatively controlled on 9/23 15. PAF: In NSR normal rate  on metoprolol and Eliquis. 16.  Likely CKD III  Creatinine 1.23 9/21  Encourage fluids 17.  Anemia  Hemoglobin 8.4 9/21 18.  Dysphagia  D3 nectars, advance diet as tolerated  LOS: 13 days A FACE TO FACE EVALUATION WAS PERFORMED  Charlett Blake 06/04/2019, 8:56 AM

## 2019-06-05 ENCOUNTER — Encounter (HOSPITAL_COMMUNITY): Payer: Medicare HMO | Admitting: Speech Pathology

## 2019-06-05 ENCOUNTER — Inpatient Hospital Stay (HOSPITAL_COMMUNITY): Payer: Medicare HMO

## 2019-06-05 ENCOUNTER — Inpatient Hospital Stay (HOSPITAL_COMMUNITY): Payer: Medicare HMO | Admitting: Speech Pathology

## 2019-06-05 LAB — GLUCOSE, CAPILLARY
Glucose-Capillary: 95 mg/dL (ref 70–99)
Glucose-Capillary: 222 mg/dL — ABNORMAL HIGH (ref 70–99)
Glucose-Capillary: 164 mg/dL — ABNORMAL HIGH (ref 70–99)

## 2019-06-05 MED ORDER — VITAMIN C 500 MG PO TABS
250.0000 mg | ORAL_TABLET | Freq: Two times a day (BID) | ORAL | Status: DC
  Administered 2019-06-05 – 2019-06-06 (×4): 250 mg via ORAL
  Filled 2019-06-05: qty 0.5
  Filled 2019-06-05 (×3): qty 1

## 2019-06-05 MED ORDER — LOPERAMIDE HCL 2 MG PO CAPS: 2.0000 mg | ORAL_CAPSULE | ORAL | Status: DC | PRN

## 2019-06-05 NOTE — Discharge Summary (Signed)
Physician Discharge Summary  Patient ID: Robert [name] MRN: [id number] DOB/AGE: 19-May-1951 68 y.o.  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 EXAM: CHEST - 2 VIEW COMPARISON:  05/22/2019 FINDINGS: Heart size is stable. Symmetric bilateral interstitial and lower lobe predominant airspace disease shows mild improvement since previous study, suggesting decreased pulmonary edema. Small left pleural effusion also appears decreased. Aortic atherosclerosis. IMPRESSION: Mild improvement in symmetric bilateral interstitial and airspace disease and small left pleural effusion, likely due to improving congestive heart failure. Electronically Signed   By: Marlaine Hind M.D.   On: 05/23/2019 09:37   Dg Abd 1 View  Result Date: 05/13/2019 CLINICAL DATA:  Vomiting EXAM: ABDOMEN - 1 VIEW COMPARISON:  04/30/2019 FINDINGS: Nonobstructive pattern of bowel gas with scattered gas present to the rectum. Scattered stool in the colon and high density ingested material in the cecal base. No free air in the abdomen on supine radiographs. A previously seen enteric feeding tube and esophagogastric tube have been removed. IMPRESSION: Nonobstructive pattern of bowel gas with scattered gas present to the rectum. Scattered stool in the colon and high density ingested material in the cecal base. Electronically Signed   By: Eddie Candle M.D.   On: 05/13/2019 16:15   Dg Chest Port 1 View  Result Date: 05/22/2019 CLINICAL DATA:  Acute respiratory distress EXAM:  PORTABLE CHEST 1 VIEW COMPARISON:  05/19/2019 FINDINGS: Heart is borderline in size. Diffuse bilateral airspace opacities and interstitial prominence is similar prior study. Small left pleural effusion. Favor edema although infection is not excluded. No acute bony abnormality. IMPRESSION: Continued diffuse interstitial and airspace opacities concerning for edema or infection. Small left effusion. Electronically Signed   By: Rolm Baptise M.D.   On: 05/22/2019 20:43   Dg Chest Port 1 View  Result Date: 05/19/2019 CLINICAL DATA:  Intubation and OG placement EXAM: PORTABLE CHEST 1 VIEW COMPARISON:  Chest radiograph 05/19/2019 FINDINGS: Interval placement of an endotracheal tube with tip terminating between the thoracic inlet and carina. Placement of a nasogastric tube with tip extending below the diaphragm beyond the field of view. Stable cardiomediastinal contours. Bilateral pulmonary infiltrates involving the left mid to lower lung and right lower lung suspicious for multifocal infection and/or edema. No pneumothorax. Possible small left pleural effusion. IMPRESSION: 1. Interval placement of endotracheal and nasogastric tubes as above. 2. Bilateral pulmonary infiltrates suspicious for multifocal infection and/or edema. Electronically Signed   By: Audie Pinto M.D.   On: 05/19/2019 19:32   Dg Chest Port 1 View  Result Date: 05/19/2019 CLINICAL DATA:  Respiratory distress EXAM: PORTABLE CHEST 1 VIEW COMPARISON:  Chest radiographs 05/15/2019, 05/13/2019 FINDINGS: Stable cardiomediastinal contours with enlarged heart size. Central venous congestion. Worsening bilateral multifocal airspace opacities. Possible small left pleural effusion. There is no pneumothorax. No acute osseous abnormality in the visualized bones. IMPRESSION: Worsening bilateral multifocal airspace opacities may represent pulmonary edema or multifocal pneumonia. Electronically Signed   By: Audie Pinto M.D.   On: 05/19/2019 17:47   Dg  Chest Port 1 View  Result Date: 05/15/2019 CLINICAL  DATA:  Pneumonia EXAM: PORTABLE CHEST 1 VIEW COMPARISON:  05/13/2019 FINDINGS: Multifocal airspace opacities are again noted bilaterally. The heart size remains enlarged. Aortic calcifications are noted. There are likely small bilateral pleural effusions, right greater than left. There is no pneumothorax. No evidence for an acute osseous abnormality. There are prominent interstitial lung markings bilaterally. IMPRESSION: 1. Stable multifocal airspace opacities bilaterally. 2. Likely small bilateral pleural effusions, right greater than left. 3. Cardiomegaly with findings of volume overload. Electronically Signed   By: Constance Holster M.D.   On: 05/15/2019 22:44   Dg Chest Port 1 View  Result Date: 05/13/2019 CLINICAL DATA:  Dyspnea. COVID-19 positive. EXAM: PORTABLE CHEST 1 VIEW COMPARISON:  04/25/2019 FINDINGS: Unchanged heart size with mild cardiomegaly. Unchanged mediastinal contours. Aortic atherosclerosis and coronary stent visualized. Opacification of the lower right hemithorax may represent combination of pleural fluid and airspace disease. Right costophrenic angle not included in the field of view. There is a left pleural effusion, increased from prior. Pulmonary and interstitial opacities edema has progressed. IMPRESSION: 1. Opacification of the lower right hemithorax may represent combination of pleural fluid and airspace disease, pneumonia versus aspiration. 2. Increased left pleural effusion since prior exam. Progressive interstitial opacities from prior likely combination of pulmonary edema and atypical pneumonia. Electronically Signed   By: Keith Rake M.D.   On: 05/13/2019 23:22   Dg Abd Portable 1v  Result Date: 05/19/2019 CLINICAL DATA:  Intubation.  OG tube placement.  COVID-19 pneumonia. EXAM: PORTABLE ABDOMEN - 1 VIEW COMPARISON:  One-view abdomen 05/13/2019. FINDINGS: The side port of the NG tube is in the fundus the stomach.  Heart size normal. Bilateral lower lobe pneumonia is present. A left pleural effusion is present. IMPRESSION: 1. The side port of the NG tube is in the fundus of the stomach. 2. Bilateral lower lobe airspace disease consistent with known COVID-19 pneumonia. 3. Left pleural effusion. Electronically Signed   By: San Morelle M.D.   On: 05/19/2019 19:42   Dg Swallowing Func-speech Pathology  Result Date: 06/05/2019 Objective Swallowing Evaluation: Type of Study: MBS-Modified Barium Swallow Study  Patient Details Name: ETAN CORIELL MRN: [id number] Date of Birth: Feb 15, 1951 Today's Date: 06/05/2019 SLP Time Calculation (min) (ACUTE ONLY): 18 min Past Medical History: Past Medical History: Diagnosis Date . Anxiety  . COPD (chronic obstructive pulmonary disease) ([place])  . Coronary artery disease  . Full dentures  . GERD (gastroesophageal reflux disease)   Rolaids as needed . High cholesterol  . History of MI (myocardial infarction)  . Hypertension   med. dosage increased 04/2016; has been on BP med. x 10 yrs. . Incarcerated umbilical hernia 0000000 . Inguinal hernia 05/2016  bilateral  . Insulin dependent diabetes mellitus ([place])  . Ischemic cardiomyopathy   a. 03/2015 EF 40-45% by LV gram. . Left leg cellulitis 10/08/2016 . Rheumatoid arthritis(714.0)  Past Surgical History: Past Surgical History: Procedure Laterality Date . CARDIAC CATHETERIZATION N/A 02/01/2015  Procedure: Left Heart Cath and Coronary Angiography;  Surgeon: Belva Crome, MD;  Location: [place] [facility];  Service: Cardiovascular;  Laterality: N/A; . CARDIAC CATHETERIZATION N/A 03/22/2015  Procedure: Left Heart Cath and Coronary Angiography;  Surgeon: Leonie Man, MD;  Location: [place] [facility];  Service: Cardiovascular;  Laterality: N/A; . CARDIAC CATHETERIZATION  09/05/2002; 03/09/2003; 04/10/2005;06/02/2005; 05/09/2006; 02/09/2007; 03/01/2007 . COLONOSCOPY  2008  Dr. Gala Romney: diverticulosis, hyperplastic polyp. . CORONARY ANGIOPLASTY  11/14/2012;  02/01/2015 . CORONARY ANGIOPLASTY WITH STENT PLACEMENT  05/16/2012  "1; makes total ~ 4" .  CORONARY STENT INTERVENTION N/A 06/06/2017  Procedure: CORONARY STENT INTERVENTION;  Surgeon: Belva Crome, MD;  Location: [place] [facility];  Service: Cardiovascular;  Laterality: N/A; . INSERTION OF MESH Bilateral 05/24/2016  Procedure: INSERTION OF MESH;  Surgeon: Coralie Keens, MD;  Location: [place];  Service: General;  Laterality: Bilateral; . LAPAROSCOPIC CHOLECYSTECTOMY   . LAPAROSCOPIC INGUINAL HERNIA WITH UMBILICAL HERNIA Bilateral 05/24/2016  Procedure: BILATERAL LAPAROSCOPIC INGUINAL HERNIA REPAIR WITH MESH AND UMBILICAL HERNIA REPAIR WITH MESH;  Surgeon: Coralie Keens, MD;  Location: [place];  Service: General;  Laterality: Bilateral; . LEFT HEART CATH AND CORONARY ANGIOGRAPHY N/A 06/06/2017  Procedure: LEFT HEART CATH AND CORONARY ANGIOGRAPHY;  Surgeon: Belva Crome, MD;  Location: [place] [facility];  Service: Cardiovascular;  Laterality: N/A; . LEFT HEART CATHETERIZATION WITH CORONARY ANGIOGRAM N/A 12/22/2011  Procedure: LEFT HEART CATHETERIZATION WITH CORONARY ANGIOGRAM;  Surgeon: Troy Sine, MD;  Location: [facility] CATH LAB;  Service: Cardiovascular;  Laterality: N/A; . LEFT HEART CATHETERIZATION WITH CORONARY ANGIOGRAM Bilateral 05/16/2012  Procedure: LEFT HEART CATHETERIZATION WITH CORONARY ANGIOGRAM;  Surgeon: Lorretta Harp, MD;  Location: [facility] CATH LAB;  Service: Cardiovascular;  Laterality: Bilateral; . LEFT HEART CATHETERIZATION WITH CORONARY ANGIOGRAM N/A 11/14/2012  Procedure: LEFT HEART CATHETERIZATION WITH CORONARY ANGIOGRAM;  Surgeon: Troy Sine, MD;  Location: [facility] CATH LAB;  Service: Cardiovascular;  Laterality: N/A; . LEFT HEART CATHETERIZATION WITH CORONARY ANGIOGRAM N/A 09/01/2013  Procedure: LEFT HEART CATHETERIZATION WITH CORONARY ANGIOGRAM;  Surgeon: Leonie Man, MD;  Location: [facility] CATH LAB;  Service: Cardiovascular;  Laterality: N/A; . [place]; 1973; 1985  x 3 . NM MYOCAR PERF WALL MOTION  08/30/2009  No significant ischemia . OPEN REDUCTION INTERNAL FIXATION (ORIF) DISTAL RADIAL FRACTURE Right 12/10/2016  Procedure: OPEN REDUCTION INTERNAL FIXATION (ORIF) DISTAL RADIAL FRACTURE;  Surgeon: Iran Planas, MD;  Location: [place];  Service: Orthopedics;  Laterality: Right; . PERCUTANEOUS CORONARY INTERVENTION-BALLOON ONLY  12/22/2011  Procedure: PERCUTANEOUS CORONARY INTERVENTION-BALLOON ONLY;  Surgeon: Troy Sine, MD;  Location: [facility] CATH LAB;  Service: Cardiovascular;; . PERCUTANEOUS CORONARY INTERVENTION-BALLOON ONLY  11/14/2012  Procedure: PERCUTANEOUS CORONARY INTERVENTION-BALLOON ONLY;  Surgeon: Troy Sine, MD;  Location: [facility] CATH LAB;  Service: Cardiovascular;; . PERCUTANEOUS CORONARY STENT INTERVENTION (PCI-S)  05/16/2012  Procedure: PERCUTANEOUS CORONARY STENT INTERVENTION (PCI-S);  Surgeon: Lorretta Harp, MD;  Location: [facility] CATH LAB;  Service: Cardiovascular;; . PERCUTANEOUS CORONARY STENT INTERVENTION (PCI-S)  09/01/2013  Procedure: PERCUTANEOUS CORONARY STENT INTERVENTION (PCI-S);  Surgeon: Leonie Man, MD;  Location: [facility] CATH LAB;  Service: Cardiovascular;; HPI: See H&P  Subjective: Pt was pleasant and cooperative.  He was agreeable to MBS. Assessment / Plan / Recommendation CHL IP CLINICAL IMPRESSIONS 06/05/2019 Clinical Impression Pt presents with improved pharyngeal abilities over previous MBS. Pt continues to presents with mild pharyngeal dysphagia with swallow initiated at pyriform sinuses with varying levels of penetration during swallow of thin liquids. With cups sips, pt is able to protect airway but has some aspirates under vocal cords when consuming consecutive sips via straw. Pt with mild throat clear that was ineffective in clearing aspirates. Additionally, when consuming barium tablet with thin liquids, table lodged in vallecula with bolus of puree required to clear. Pt appears at reduced risk of aspiraiton when  following general aspiraiton precautions. Recommend continuing dysphagia 3 with thin liquids via cup, medicine whole in puree and intermittent supervision. SLP Visit Diagnosis Dysphagia, pharyngeal phase (R13.13) Attention and concentration deficit following -- Frontal  lobe and executive function deficit following -- Impact on safety and function Mild aspiration risk   CHL IP TREATMENT RECOMMENDATION 06/05/2019 Treatment Recommendations Therapy as outlined in treatment plan below   Prognosis 05/29/2019 Prognosis for Safe Diet Advancement Good Barriers to Reach Goals Time post onset;Severity of deficits Barriers/Prognosis Comment -- CHL IP DIET RECOMMENDATION 06/05/2019 SLP Diet Recommendations Dysphagia 3 (Mech soft) solids;Thin liquid Liquid Administration via Cup Medication Administration Whole meds with puree Compensations Minimize environmental distractions;Slow rate;Small sips/bites Postural Changes Seated upright at 90 degrees   CHL IP OTHER RECOMMENDATIONS 06/05/2019 Recommended Consults -- Oral Care Recommendations Oral care BID Other Recommendations --   CHL IP FOLLOW UP RECOMMENDATIONS 06/05/2019 Follow up Recommendations Inpatient Rehab   CHL IP FREQUENCY AND DURATION 05/29/2019 Speech Therapy Frequency (ACUTE ONLY) min 5x/week Treatment Duration 2 weeks      CHL IP ORAL PHASE 06/05/2019 Oral Phase WFL Oral - Pudding Teaspoon -- Oral - Pudding Cup -- Oral - Honey Teaspoon -- Oral - Honey Cup -- Oral - Nectar Teaspoon -- Oral - Nectar Cup -- Oral - Nectar Straw -- Oral - Thin Teaspoon -- Oral - Thin Cup -- Oral - Thin Straw -- Oral - Puree -- Oral - Mech Soft -- Oral - Regular -- Oral - Multi-Consistency -- Oral - Pill -- Oral Phase - Comment --  CHL IP PHARYNGEAL PHASE 06/05/2019 Pharyngeal Phase Impaired Pharyngeal- Pudding Teaspoon -- Pharyngeal -- Pharyngeal- Pudding Cup -- Pharyngeal -- Pharyngeal- Honey Teaspoon -- Pharyngeal -- Pharyngeal- Honey Cup NT Pharyngeal -- Pharyngeal- Nectar Teaspoon NT Pharyngeal  -- Pharyngeal- Nectar Cup -- Pharyngeal -- Pharyngeal- Nectar Straw NT Pharyngeal -- Pharyngeal- Thin Teaspoon Delayed swallow initiation-pyriform sinuses;Reduced anterior laryngeal mobility;Reduced laryngeal elevation;Penetration/Aspiration during swallow Pharyngeal Material enters airway, remains ABOVE vocal cords then ejected out Pharyngeal- Thin Cup Delayed swallow initiation-pyriform sinuses;Reduced anterior laryngeal mobility;Reduced laryngeal elevation;Penetration/Aspiration during swallow;Pharyngeal residue - valleculae Pharyngeal Material enters airway, remains ABOVE vocal cords then ejected out Pharyngeal- Thin Straw Delayed swallow initiation-pyriform sinuses;Reduced anterior laryngeal mobility;Reduced laryngeal elevation;Penetration/Aspiration during swallow Pharyngeal Material enters airway, CONTACTS cords and not ejected out Pharyngeal- Puree Delayed swallow initiation-vallecula Pharyngeal -- Pharyngeal- Mechanical Soft NT Pharyngeal -- Pharyngeal- Regular -- Pharyngeal -- Pharyngeal- Multi-consistency -- Pharyngeal -- Pharyngeal- Pill Delayed swallow initiation-vallecula;Reduced anterior laryngeal mobility;Reduced laryngeal elevation Pharyngeal -- Pharyngeal Comment barium tablet lodged in vallecula with silent aspiration of thin liquids, required puree to clear tablet  CHL IP CERVICAL ESOPHAGEAL PHASE 06/05/2019 Cervical Esophageal Phase WFL Pudding Teaspoon -- Pudding Cup -- Honey Teaspoon -- Honey Cup -- Nectar Teaspoon -- Nectar Cup -- Nectar Straw -- Thin Teaspoon -- Thin Cup -- Thin Straw -- Puree -- Mechanical Soft -- Regular -- Multi-consistency -- Pill -- Cervical Esophageal Comment -- Happi Overton 06/05/2019, 1:03 PM              Dg Swallowing Func-speech Pathology  Result Date: 05/29/2019 Objective Swallowing Evaluation: Type of Study: MBS-Modified Barium Swallow Study  Patient Details Name: RYYAN AREDONDO MRN: [id number] Date of Birth: 05/13/1951 Today's Date: 05/29/2019 Time: SLP Start  Time (ACUTE ONLY): [id number] -SLP Stop Time (ACUTE ONLY): 0945 SLP Time Calculation (min) (ACUTE ONLY): 40 min Past Medical History: Past Medical History: Diagnosis Date . Anxiety  . COPD (chronic obstructive pulmonary disease) ([place])  . Coronary artery disease  . Full dentures  . GERD (gastroesophageal reflux disease)   Rolaids as needed . High cholesterol  . History of MI (myocardial infarction)  . Hypertension   med. dosage increased 04/2016; has  been on BP med. x 10 yrs. . Incarcerated umbilical hernia 0000000 . Inguinal hernia 05/2016  bilateral  . Insulin dependent diabetes mellitus ([place])  . Ischemic cardiomyopathy   a. 03/2015 EF 40-45% by LV gram. . Left leg cellulitis 10/08/2016 . Rheumatoid arthritis(714.0)  Past Surgical History: Past Surgical History: Procedure Laterality Date . CARDIAC CATHETERIZATION N/A 02/01/2015  Procedure: Left Heart Cath and Coronary Angiography;  Surgeon: Belva Crome, MD;  Location: [place] [facility];  Service: Cardiovascular;  Laterality: N/A; . CARDIAC CATHETERIZATION N/A 03/22/2015  Procedure: Left Heart Cath and Coronary Angiography;  Surgeon: Leonie Man, MD;  Location: [place] [facility];  Service: Cardiovascular;  Laterality: N/A; . CARDIAC CATHETERIZATION  09/05/2002; 03/09/2003; 04/10/2005;06/02/2005; 05/09/2006; 02/09/2007; 03/01/2007 . COLONOSCOPY  2008  Dr. Gala Romney: diverticulosis, hyperplastic polyp. . CORONARY ANGIOPLASTY  11/14/2012; 02/01/2015 . CORONARY ANGIOPLASTY WITH STENT PLACEMENT  05/16/2012  "1; makes total ~ 4" . CORONARY STENT INTERVENTION N/A 06/06/2017  Procedure: CORONARY STENT INTERVENTION;  Surgeon: Belva Crome, MD;  Location: [place] [facility];  Service: Cardiovascular;  Laterality: N/A; . INSERTION OF MESH Bilateral 05/24/2016  Procedure: INSERTION OF MESH;  Surgeon: Coralie Keens, MD;  Location: [place];  Service: General;  Laterality: Bilateral; . LAPAROSCOPIC CHOLECYSTECTOMY   . LAPAROSCOPIC INGUINAL HERNIA WITH UMBILICAL HERNIA  Bilateral 05/24/2016  Procedure: BILATERAL LAPAROSCOPIC INGUINAL HERNIA REPAIR WITH MESH AND UMBILICAL HERNIA REPAIR WITH MESH;  Surgeon: Coralie Keens, MD;  Location: [place];  Service: General;  Laterality: Bilateral; . LEFT HEART CATH AND CORONARY ANGIOGRAPHY N/A 06/06/2017  Procedure: LEFT HEART CATH AND CORONARY ANGIOGRAPHY;  Surgeon: Belva Crome, MD;  Location: [place] [facility];  Service: Cardiovascular;  Laterality: N/A; . LEFT HEART CATHETERIZATION WITH CORONARY ANGIOGRAM N/A 12/22/2011  Procedure: LEFT HEART CATHETERIZATION WITH CORONARY ANGIOGRAM;  Surgeon: Troy Sine, MD;  Location: [facility] CATH LAB;  Service: Cardiovascular;  Laterality: N/A; . LEFT HEART CATHETERIZATION WITH CORONARY ANGIOGRAM Bilateral 05/16/2012  Procedure: LEFT HEART CATHETERIZATION WITH CORONARY ANGIOGRAM;  Surgeon: Lorretta Harp, MD;  Location: [facility] CATH LAB;  Service: Cardiovascular;  Laterality: Bilateral; . LEFT HEART CATHETERIZATION WITH CORONARY ANGIOGRAM N/A 11/14/2012  Procedure: LEFT HEART CATHETERIZATION WITH CORONARY ANGIOGRAM;  Surgeon: Troy Sine, MD;  Location: [facility] CATH LAB;  Service: Cardiovascular;  Laterality: N/A; . LEFT HEART CATHETERIZATION WITH CORONARY ANGIOGRAM N/A 09/01/2013  Procedure: LEFT HEART CATHETERIZATION WITH CORONARY ANGIOGRAM;  Surgeon: Leonie Man, MD;  Location: [facility] CATH LAB;  Service: Cardiovascular;  Laterality: N/A; . [place]; 1973; 1985  x 3 . NM MYOCAR PERF WALL MOTION  08/30/2009  No significant ischemia . OPEN REDUCTION INTERNAL FIXATION (ORIF) DISTAL RADIAL FRACTURE Right 12/10/2016  Procedure: OPEN REDUCTION INTERNAL FIXATION (ORIF) DISTAL RADIAL FRACTURE;  Surgeon: Iran Planas, MD;  Location: [place];  Service: Orthopedics;  Laterality: Right; . PERCUTANEOUS CORONARY INTERVENTION-BALLOON ONLY  12/22/2011  Procedure: PERCUTANEOUS CORONARY INTERVENTION-BALLOON ONLY;  Surgeon: Troy Sine, MD;  Location: [facility] CATH LAB;  Service: Cardiovascular;; .  PERCUTANEOUS CORONARY INTERVENTION-BALLOON ONLY  11/14/2012  Procedure: PERCUTANEOUS CORONARY INTERVENTION-BALLOON ONLY;  Surgeon: Troy Sine, MD;  Location: [facility] CATH LAB;  Service: Cardiovascular;; . PERCUTANEOUS CORONARY STENT INTERVENTION (PCI-S)  05/16/2012  Procedure: PERCUTANEOUS CORONARY STENT INTERVENTION (PCI-S);  Surgeon: Lorretta Harp, MD;  Location: [facility] CATH LAB;  Service: Cardiovascular;; . PERCUTANEOUS CORONARY STENT INTERVENTION (PCI-S)  09/01/2013  Procedure: PERCUTANEOUS CORONARY STENT INTERVENTION (PCI-S);  Surgeon: Leonie Man, MD;  Location: [facility] CATH  LAB;  Service: Cardiovascular;; HPI: See H&P  Subjective: Pt was pleasant and cooperative.  He was agreeable to MBS. Assessment / Plan / Recommendation CHL IP CLINICAL IMPRESSIONS 05/29/2019 Clinical Impression Patient demonstrates minimally improved swallow function compared to his previous MBS. Patient continues to demonstrate moderate pharyngeal dysphagia due to decreased glottic closure and impaired pharyngeal strength. These deficits in result in intermittent, deep penetration of thin liquids via cup despite use of compensatory strategies and intermittent sensed aspiration with larger boluses of thin.  Although the chin tuck was ineffective in eliminating deep penetration of thin, it was effective in decreased vallecular residue with solid textures and reduced amount of penetrates with nectar-thick liquids. Recommend patient continue current diet of Dys. 3 textures with nectar-thick liquids but initiate use of a chin tuck to help minimize pharyngeal residue. Also recommend patient initiate the water protocol. Educated patient in regards to results and recommendations, he verbalized understanding. SLP Visit Diagnosis Dysphagia, pharyngeal phase (R13.13) Attention and concentration deficit following -- Frontal lobe and executive function deficit following -- Impact on safety and function Moderate aspiration risk   CHL IP TREATMENT  RECOMMENDATION 05/29/2019 Treatment Recommendations Therapy as outlined in treatment plan below   Prognosis 05/29/2019 Prognosis for Safe Diet Advancement Good Barriers to Reach Goals Time post onset;Severity of deficits Barriers/Prognosis Comment -- CHL IP DIET RECOMMENDATION 05/29/2019 SLP Diet Recommendations Nectar thick liquid;Dysphagia 3 (Mech soft) solids Liquid Administration via Cup Medication Administration Whole meds with puree Compensations Slow rate;Small sips/bites;Follow solids with liquid;Minimize environmental distractions;Clear throat intermittently;Multiple dry swallows after each bite/sip;Chin tuck Postural Changes --   CHL IP OTHER RECOMMENDATIONS 05/29/2019 Recommended Consults -- Oral Care Recommendations Oral care BID Other Recommendations Order thickener from pharmacy;Prohibited food (jello, ice cream, thin soups);Remove water pitcher;Have oral suction available;Clarify dietary restrictions   CHL IP FOLLOW UP RECOMMENDATIONS 05/29/2019 Follow up Recommendations Inpatient Rehab   CHL IP FREQUENCY AND DURATION 05/29/2019 Speech Therapy Frequency (ACUTE ONLY) min 5x/week Treatment Duration 2 weeks      CHL IP ORAL PHASE 05/29/2019 Oral Phase WFL Oral - Pudding Teaspoon -- Oral - Pudding Cup -- Oral - Honey Teaspoon -- Oral - Honey Cup -- Oral - Nectar Teaspoon -- Oral - Nectar Cup -- Oral - Nectar Straw -- Oral - Thin Teaspoon -- Oral - Thin Cup -- Oral - Thin Straw -- Oral - Puree -- Oral - Mech Soft -- Oral - Regular -- Oral - Multi-Consistency -- Oral - Pill -- Oral Phase - Comment --  CHL IP PHARYNGEAL PHASE 05/29/2019 Pharyngeal Phase Impaired Pharyngeal- Pudding Teaspoon -- Pharyngeal -- Pharyngeal- Pudding Cup -- Pharyngeal -- Pharyngeal- Honey Teaspoon -- Pharyngeal -- Pharyngeal- Honey Cup NT Pharyngeal -- Pharyngeal- Nectar Teaspoon NT Pharyngeal -- Pharyngeal- Nectar Cup Penetration/Aspiration during swallow;Reduced epiglottic inversion;Pharyngeal residue - valleculae;Reduced  airway/laryngeal closure Pharyngeal Material enters airway, remains ABOVE vocal cords then ejected out;Material enters airway, remains ABOVE vocal cords and not ejected out;Material does not enter airway Pharyngeal- Nectar Straw NT Pharyngeal -- Pharyngeal- Thin Teaspoon -- Pharyngeal -- Pharyngeal- Thin Cup Delayed swallow initiation-vallecula;Reduced airway/laryngeal closure;Penetration/Aspiration during swallow;Pharyngeal residue - valleculae Pharyngeal Material enters airway, CONTACTS cords and then ejected out;Material enters airway, CONTACTS cords and not ejected out Pharyngeal- Thin Straw Penetration/Aspiration during swallow;Pharyngeal residue - valleculae;Reduced airway/laryngeal closure;Reduced epiglottic inversion Pharyngeal Material enters airway, passes BELOW cords then ejected out;Material enters airway, CONTACTS cords and not ejected out;Material enters airway, CONTACTS cords and then ejected out Pharyngeal- Puree Reduced epiglottic inversion;Pharyngeal residue - valleculae Pharyngeal -- Pharyngeal- Mechanical  Soft -- Pharyngeal -- Pharyngeal- Regular Pharyngeal residue - valleculae;Reduced epiglottic inversion Pharyngeal -- Pharyngeal- Multi-consistency -- Pharyngeal -- Pharyngeal- Pill -- Pharyngeal -- Pharyngeal Comment --  CHL IP CERVICAL ESOPHAGEAL PHASE 05/22/2019 Cervical Esophageal Phase -- Pudding Teaspoon -- Pudding Cup -- Honey Teaspoon -- Honey Cup -- Nectar Teaspoon -- Nectar Cup -- Nectar Straw -- Thin Teaspoon -- Thin Cup -- Thin Straw -- Puree -- Mechanical Soft -- Regular -- Multi-consistency -- Pill -- Cervical Esophageal Comment Barium observed in esophagus following po trials.  PAYNE, COURTNEY 05/29/2019, 3:51 PM    Weston [name], MA, CCC-SLP [phone]            Labs:  Basic Metabolic Panel: BMP Latest Ref Rng & Units 06/02/2019 05/26/2019 05/23/2019  Glucose 70 - 99 mg/dL 125(H) 161(H) 179(H)  BUN 8 - 23 mg/dL 27(H) 25(H) 14  Creatinine 0.61 - 1.24 mg/dL 1.23 1.27(H) 1.30(H)   BUN/Creat Ratio 10 - 24 - - -  Sodium 135 - 145 mmol/L 139 136 138  Potassium 3.5 - 5.1 mmol/L 4.3 4.5 4.3  Chloride 98 - 111 mmol/L 103 97(L) 101  CO2 22 - 32 mmol/L 27 29 30   Calcium 8.9 - 10.3 mg/dL 9.2 8.8(L) 8.8(L)    CBC: CBC Latest Ref Rng & Units 06/02/2019 05/26/2019 05/23/2019  WBC 4.0 - 10.5 K/uL 6.0 6.8 6.1  Hemoglobin 13.0 - 17.0 g/dL 8.4(L) 8.5(L) 9.1(L)  Hematocrit 39.0 - 52.0 % 26.8(L) 26.7(L) 28.5(L)  Platelets 150 - 400 K/uL 220 242 248    CBG: Recent Labs  Lab 06/05/19 0611 06/05/19 1115 06/05/19 1718 06/06/19 0639 06/06/19 1154  GLUCAP 95 222* 164* 120* 143*    Brief HPI:   GEVIN ORTON is a 68 y.o. male with history of CAD, ICM, PAD, SCI with LLE weakness, T2DM, RA- on methotrexate/actemra, anxiety disorder, COPD who was originally admitted on 04/12/19 with severe SOB, fever and cough. He was intubated in ED, found to be COVID 19 positive and treated with Remdesivir, Actemra, steroids and broad spectrum antibiotics. Hospital course significant for septic shock due to PNA, AKI with urinary retention, development of A fib with RVR with demand ischemia as well as issues with fluid overload. He tolerated extubation but continued to require HF oxygen.   Endurance was improving with plans for CIR but developed hypoxemic respiratory failure requiring reintubation. He was found to have A fib with RVR with elevated troponin without signs of ACS.  He was transferred to [facility] due to concerns of need for intervention and was treated with IV BB as well as IV diuresis.  Cardiology recommended medical treatment of CAD and IV heparin transitioned to Eliquis. He tolerated extubation and was started on dysphagia 2, nectars. Respiratory status has improved and he was cleared to start CIR due to debility.    Hospital Course: ARMONDO DEBEVEC was admitted to rehab 05/22/2019 for inpatient therapies to consist of PT, ST and OT at least three hours five days a week. Past admission  physiatrist, therapy team and rehab RN have worked together to provide customized collaborative inpatient rehab. He developed respiratory distress hours after admission requiring IV diuresis. He was maintained on lasix 40 mg daily and serial check of lytes showed that renal status is improving. Hypokalemia has resolved with increase in supplement. Heart rate has been stable and NSR on metoprolol.   His weight has been on upward trend but no signs of overload noted. His respiratory status has improved and he has been weaned off  oxygen. Respiratory status is stable on Dulera bid with intermittent dry cough. Xanax was scheduled at bedtime to help with anxiety and sleep disruption. He has been educated on relaxation techniques to help manage anxiety. His swallow function has improved and he is tolerating regular diet without sign of aspiration. Air mattress overlay was used for pressure relief measures with santyl and damp to dry dressings.  Protein supplements were added to help with wound healing. WOC was consulted for input and hydrotherapy was used help debride fibro-necrotic tissue. He is to follow up with wound clinic for further management after discharge.    Diabetes has been monitored with ac/hs CBG checks and SSI was use prn for tighter BS control.  Lantus was discontinued, Amaryl was added and titrated upwards for tighter control. Foley was discontinued as mobility improved but he continues to have difficulty voiding with urinary retention. He has been educated on I/O caths and is able to perform them  every 4-6 hours to keep bladder volumes <350 cc. Follow up CBC showed anemia of chronic disease is stable. He has made gains during rehab stay and is at supervision level. He will continue to receive follow up HHPT,HHOT and [place] by  after discharge   Rehab course: During patient's stay in rehab weekly team conferences were held to monitor patient's progress, set goals and discuss barriers to discharge.  At admission, patient required max assist with ADL tasks and mod assist +2 for mobility. He demonstrated mild deficits in short term recall and complex problem solving. Voice was hoarse but intelligible and he was tolerating dysphagia 2, nectar liquids without s/s of aspiration. He  has had improvement in activity tolerance, balance, postural control as well as ability to compensate for deficits. He is able to complete ADL tasks with supervision. He requires supervision for transfers and to ambulate 350' with RW.  He is tolerating dysphagia 3 diet with use of compensatory strategies. He is able to complete functional and complex tasks at modified independent level. Family education was completed with daughter regarding all aspects of care and safety.    Disposition: Home   Diet: Carb modified/heart healthy diet.   Special Instructions: 1. Recommend repeat CBC/BMET in 1-2 weeks to follow up on anemia and renal status.  2. In and out catherizations every 4-6 hours to keep volumes <350 cc. 3. Cleanse wound on buttock with normal saline daily --wipe dry and apply layer of santyl to yellow fibronecrotic tissue then cover with damp to dry dressing. Change daily.  4. Protein supplements 2-3 times a day to help promote wound healing.   Discharge Instructions    Ambulatory referral to Physical Medicine Rehab   Complete by: As directed    1-2 weeks transitional care appt      Allergies as of 06/07/2019      Reactions   Ivp Dye [iodinated Diagnostic Agents] Itching, Rash      Medication List    STOP taking these medications   feeding supplement (ENSURE ENLIVE) Liqd   metFORMIN 1000 MG tablet Commonly known as: GLUCOPHAGE   primidone 50 MG tablet Commonly known as: MYSOLINE     TAKE these medications   acarbose 100 MG tablet Commonly known as: PRECOSE Take 1 tablet (100 mg total) by mouth 3 (three) times daily with meals.   acetaminophen 650 MG CR tablet Commonly known as: TYLENOL Take  650 mg by mouth every 8 (eight) hours as needed for pain.   ALPRAZolam 0.5 MG tablet--Rx #45 pills  Commonly known as: XANAX Take 1 tablet (0.5 mg total) by mouth 3 (three) times daily as needed for anxiety or sleep.   apixaban 5 MG Tabs tablet Commonly known as: ELIQUIS Take 1 tablet (5 mg total) by mouth 2 (two) times daily.   ascorbic acid 250 MG tablet Commonly known as: VITAMIN C Take 1 tablet (250 mg total) by mouth 2 (two) times daily.   atorvastatin 80 MG tablet Commonly known as: LIPITOR Take 1 tablet (80 mg total) by mouth daily at 6 PM. What changed: See the new instructions.   clopidogrel 75 MG tablet Commonly known as: PLAVIX TAKE (1) TABLET BY MOUTH ONCE DAILY.   collagenase ointment Commonly known as: SANTYL Apply topically daily. And cover with damp to dry dressing daily. Change more frequently if soiled   fish oil-omega-3 fatty acids 1000 MG capsule Take 1 g by mouth daily.   folic acid 1 MG tablet Commonly known as: FOLVITE Take 1 tablet (1 mg total) by mouth daily.   furosemide 40 MG tablet Commonly known as: LASIX Take 1 tablet (40 mg total) by mouth daily.   gabapentin 100 MG capsule Commonly known as: NEURONTIN Take 2 capsules (200 mg total) by mouth every 8 (eight) hours. What changed:   medication strength  how much to take  when to take this  reasons to take this   glimepiride 2 MG tablet Commonly known as: AMARYL Take 1 tablet (2 mg total) by mouth daily with breakfast.   metoprolol tartrate 50 MG tablet Commonly known as: LOPRESSOR Take 1 tablet (50 mg total) by mouth 2 (two) times daily.   mometasone-formoterol 200-5 MCG/ACT Aero Commonly known as: DULERA Inhale 2 puffs into the lungs 2 (two) times daily.   multivitamin with minerals Tabs tablet Take 1 tablet by mouth daily.   nitroGLYCERIN 0.4 MG SL tablet Commonly known as: NITROSTAT Place 1 tablet (0.4 mg total) under the tongue every 5 (five) minutes x 3 doses as  needed for chest pain.   nystatin powder Commonly known as: MYCOSTATIN/NYSTOP Apply topically 3 (three) times daily.   potassium chloride SA 20 MEQ tablet Commonly known as: K-DUR Take 1 tablet (20 mEq total) by mouth 2 (two) times daily. What changed:   how much to take  how to take this  when to take this  additional instructions   tamsulosin 0.4 MG Caps capsule Commonly known as: FLOMAX Take 2 capsules (0.8 mg total) by mouth daily after supper.      Follow-up Information    Meredith Staggers, MD Follow up.   Specialty: Physical Medicine and Rehabilitation Why: office will call you with follow up appt Contact information: [address] [place] [address] [phone]        Redmond School, MD Follow up on 06/09/2019.   Specialty: Internal Medicine Why: for post hospital follow up Contact information: [address] [place] [id number] [phone]        Sanda Klein, MD. Call on 06/09/2019.   Specialty: Cardiology Why: for followup appointment Contact information: [address] [place] [address] [phone]        Bjorn Loser, MD Follow up on 06/11/2019.   Specialty: Urology Why: Be there at 10 am.  Contact information: [place] [address] [phone]           Signed: Bary Leriche 06/08/2019, 11:01 PM

## 2019-06-05 NOTE — Progress Notes (Signed)
Modified Barium Swallow Progress Note  Patient Details  Name: Robert [name] MRN: [id number] Date of Birth: 12/16/1950  Today's Date: 06/05/2019  Modified Barium Swallow completed.  Full report located under Chart Review in the Imaging Section.  Brief recommendations include the following:  Clinical Impression  Pt presents with improved pharyngeal abilities over previous MBS. Pt continues to presents with mild pharyngeal dysphagia with swallow initiated at pyriform sinuses with varying levels of penetration during swallow of thin liquids. With cups sips, pt is able to protect airway but has some aspirates under vocal cords when consuming consecutive sips via straw. Pt with mild throat clear that was ineffective in clearing aspirates. Additionally, when consuming barium tablet with thin liquids, table lodged in vallecula with bolus of puree required to clear. Pt appears at reduced risk of aspiraiton when following general aspiraiton precautions. Recommend continuing dysphagia 3 with thin liquids via cup, medicine whole in puree and intermittent supervision.   Swallow Evaluation Recommendations       SLP Diet Recommendations: Dysphagia 3 (Mech soft) solids;Thin liquid   Liquid Administration via: Cup   Medication Administration: Whole meds with puree   Supervision: Patient able to self feed;Intermittent supervision to cue for compensatory strategies   Compensations: Minimize environmental distractions;Slow rate;Small sips/bites   Postural Changes: Seated upright at 90 degrees   Oral Care Recommendations: Oral care BID        [name] 06/05/2019,1:00 PM

## 2019-06-05 NOTE — Progress Notes (Signed)
Physical Therapy Wound Treatment Patient Details  Name: Robert [name] MRN: [id number] Date of Birth: [birth year]  Today's Date: 06/05/2019 PT Individual Time: 0539-7673 PT Individual Time Calculation (min): 45 min   Subjective  Subjective: Pt pleasant and agreeable to hydrotherapy.  Patient and Family Stated Goals: Heal wound, keep it from getting worse Date of Onset: (Unknown) Prior Treatments: Santyl and wet>dry dressing changes  Pain Score:  Pt tolerated well without premedication this session.    Wound Assessment  Pressure Injury 04/17/19 Sacrum Medial Unstageable - Full thickness tissue loss in which the base of the ulcer is covered by slough (yellow, tan, gray, green or brown) and/or eschar (tan, brown or black) in the wound bed. dark purple with some skin sloug (Active)  Dressing Type ABD;Barrier Film (skin prep);Gauze (Comment);Moist to dry 06/05/19 1512  Dressing Clean;Dry;Intact;Changed 06/05/19 1512  Dressing Change Frequency Daily 06/05/19 1512  State of Healing Non-healing 06/05/19 1512  Site / Wound Assessment Yellow;Red 06/05/19 1512  % Wound base Red or Granulating 50% 06/05/19 1512  % Wound base Yellow/Fibrinous Exudate 50% 06/05/19 1512  % Wound base Black/Eschar 0% 06/05/19 1512  Peri-wound Assessment Intact;Purple 06/05/19 1512  Wound Length (cm) 2 cm 05/27/19 1132  Wound Width (cm) 0.5 cm 05/27/19 1132  Wound Depth (cm) 0 cm 05/27/19 1132  Wound Surface Area (cm^2) 1 cm^2 05/27/19 1132  Wound Volume (cm^3) 0 cm^3 05/27/19 1132  Margins Unattached edges (unapproximated) 06/05/19 1512  Drainage Amount Minimal 06/05/19 1512  Drainage Description Serosanguineous 06/05/19 1512  Treatment Debridement (Selective);Hydrotherapy (Pulse lavage);Packing (Saline gauze) 06/05/19 1512   Santyl applied to wound bed prior to applying dressing.    Pressure Injury 04/17/19 Buttocks Right;Lower Unstageable - Full thickness tissue loss in which the base of the ulcer is covered by  slough (yellow, tan, gray, green or brown) and/or eschar (tan, brown or black) in the wound bed. (Active)  Dressing Type ABD;Barrier Film (skin prep);Gauze (Comment);Moist to dry 06/05/19 1512  Dressing Clean;Intact;Dry;Changed 06/05/19 1512  Dressing Change Frequency Daily 06/05/19 1512  State of Healing Eschar 06/05/19 1512  Site / Wound Assessment Yellow;Pink 06/05/19 1512  % Wound base Red or Granulating 50% 06/05/19 1512  % Wound base Yellow/Fibrinous Exudate 50% 06/05/19 1512  % Wound base Black/Eschar 0% 06/05/19 1512  % Wound base Other/Granulation Tissue (Comment) 0% 06/05/19 1512  Peri-wound Assessment Erythema (blanchable);Pink 06/05/19 1512  Wound Length (cm) 6 cm 05/27/19 1132  Wound Width (cm) 4 cm 05/27/19 1132  Wound Depth (cm) 0.5 cm 06/03/19 1338  Wound Surface Area (cm^2) 24 cm^2 05/27/19 1132  Wound Volume (cm^3) 0 cm^3 05/27/19 1132  Margins Unattached edges (unapproximated) 06/05/19 1512  Drainage Amount None 06/05/19 1512  Drainage Description Serosanguineous 06/05/19 1512  Treatment Debridement (Selective);Hydrotherapy (Pulse lavage);Packing (Saline gauze) 06/05/19 1512   Santyl applied to wound bed prior to applying dressing.    Pressure Injury 04/17/19 Buttocks Left;Lower Unstageable - Full thickness tissue loss in which the base of the ulcer is covered by slough (yellow, tan, gray, green or brown) and/or eschar (tan, brown or black) in the wound bed. WOC updated to Stage 3 on  (Active)  Dressing Type ABD;Barrier Film (skin prep);Gauze (Comment);Moist to dry 06/05/19 1512  Dressing Clean;Dry;Intact;Changed 06/05/19 1512  Dressing Change Frequency Daily 06/05/19 1512  State of Healing Non-healing 06/05/19 1512  Site / Wound Assessment Pink;Yellow 06/05/19 1512  % Wound base Red or Granulating 75% 06/05/19 1512  % Wound base Yellow/Fibrinous Exudate 25% 06/05/19 1512  % Wound base  Black/Eschar 0% 06/05/19 1512  Peri-wound Assessment Erythema (blanchable)  06/05/19 1512  Wound Length (cm) 1.5 cm 05/27/19 1132  Wound Width (cm) 0.5 cm 05/27/19 1132  Wound Depth (cm) 0 cm 05/27/19 1132  Wound Surface Area (cm^2) 0.75 cm^2 05/27/19 1132  Wound Volume (cm^3) 0 cm^3 05/27/19 1132  Margins Unattached edges (unapproximated) 06/05/19 1512  Drainage Amount None 06/05/19 1512  Drainage Description Serosanguineous 06/05/19 1512  Treatment Debridement (Selective);Hydrotherapy (Pulse lavage);Packing (Saline gauze) 06/05/19 1512   Santyl applied to wound bed prior to applying dressing.     Hydrotherapy Pulsed lavage therapy - wound location: bilateral buttock wounds and coccyx Pulsed Lavage with Suction (psi): 12 psi Pulsed Lavage with Suction - Normal Saline Used: 1000 mL Pulsed Lavage Tip: Tip with splash shield Selective Debridement Selective Debridement - Location: bilateral buttock wounds and coccyx Selective Debridement - Tools Used: Forceps;Scalpel Selective Debridement - Tissue Removed: yellow necrotic tissue   Wound Assessment and Plan  Wound Therapy - Assess/Plan/Recommendations Wound Therapy - Clinical Statement: Progressing well with removal of necrotic tissue on R buttock wound. Slough on L buttock wound and coccyx wound progressing as well with L buttock wound almost completely pink. Pt will benefit from continued hydrotherapy for continued selective removal of unviable tissue, to decrease bioburden, and promote wound bed healing.  Wound Therapy - Functional Problem List: Decreased tolerance for position changes due to skin breakdown, decreased mobility in general.  Factors Delaying/Impairing Wound Healing: Diabetes Mellitus;Immobility;Multiple medical problems Hydrotherapy Plan: Debridement;Dressing change;Patient/family education;Pulsatile lavage with suction Wound Therapy - Frequency: 6X / week Wound Therapy - Follow Up Recommendations: Home health RN Wound Plan: See above  Wound Therapy Goals- Improve the function of patient's  integumentary system by progressing the wound(s) through the phases of wound healing (inflammation - proliferation - remodeling) by: Decrease Necrotic Tissue to: 0 Decrease Necrotic Tissue - Progress: Progressing toward goal Increase Granulation Tissue to: 100 Increase Granulation Tissue - Progress: Progressing toward goal Goals/treatment plan/discharge plan were made with and agreed upon by patient/family: Yes Time For Goal Achievement: 7 days Wound Therapy - Potential for Goals: Good  Goals will be updated until maximal potential achieved or discharge criteria met.  Discharge criteria: when goals achieved, discharge from hospital, MD decision/surgical intervention, no progress towards goals, refusal/missing three consecutive treatments without notification or medical reason.  GP     Thelma Comp 06/05/2019, 3:16 PM   Rolinda Roan, PT, DPT Acute Rehabilitation Services Pager: [phone] Office: [phone]

## 2019-06-05 NOTE — Progress Notes (Signed)
Occupational Therapy Session Note  Patient Details  Name: Robert [name] MRN: [id number] Date of Birth: 10/24/1950  Today's Date: 06/05/2019 OT Individual Time: 0700-0800 OT Individual Time Calculation (min): 60 min    Short Term Goals: Week 2:  OT Short Term Goal 1 (Week 2): Pt will don pants with (S) OT Short Term Goal 2 (Week 2): Pt will demonstrate improved endurance by standing to complete 2 grooming tasks with no rest breaks OT Short Term Goal 3 (Week 2): Pt will complete ambulatory bathroom transfer with min A     Skilled Therapeutic Interventions/Progress Updates:  Pt received supine in bed, ready to get to w/c and no c/o pain. Pt agreeable to OT tx today. Pt sits EOB with (S) and transfers to w/c with (S). Pt performed grooming tasks while seated in w/c at sink. Pt is mod I for UB bathing. Pt completed UB dressing with mod I with increased time while seated in w/c. Pt doffs socks with reacher and dons socks with mod I for increased time. Pt dons shoes with mod I with use of shoe buttons and shoe funnel. Pt completes oral care with mod I while seated in w/c at sink level. OT educated pt on strategies to assist donning shoe with AFO; provided pt with information regarding how to purchase a shoe funnel for home. Exited session pt in w/c with all needs met.      Therapy Documentation Precautions:  Precautions Precautions: Fall Precaution Comments: sacral wound, supplemental oxygen Required Braces or Orthoses: Other Brace Other Brace: B Prevalon boots Restrictions Weight Bearing Restrictions: No    Pain: Pain Assessment Pain Scale: 0-10 Pain Score: 0-No pain ADL: ADL Grooming: Other (comment)(refused) Upper Body Bathing: Supervision/safety Where Assessed-Upper Body Bathing: Edge of bed Lower Body Bathing: Maximal assistance Where Assessed-Lower Body Bathing: Edge of bed(standing EOB) Upper Body Dressing: Minimal assistance Where Assessed-Upper Body Dressing: Edge of  bed Lower Body Dressing: Maximal assistance Where Assessed-Lower Body Dressing: Edge of bed Toileting: Not assessed Toilet Transfer: Dependent(stedy)   Therapy/Group: Individual Therapy    06/05/2019, 9:11 AM  

## 2019-06-05 NOTE — Progress Notes (Signed)
[facility] PHYSICAL MEDICINE & REHABILITATION PROGRESS NOTE   Subjective/Complaints:  up with therapy. No new issues today. Excited about discharge this weekend. Right buttock pain  ROS: Patient denies fever, rash, sore throat, blurred vision, nausea, vomiting, diarrhea, cough, shortness of breath or chest pain,  headache, or mood change.    Objective:   No results found. No results for input(s): WBC, HGB, HCT, PLT in the last 72 hours. No results for input(s): NA, K, CL, CO2, GLUCOSE, BUN, CREATININE, CALCIUM in the last 72 hours.  Intake/Output Summary (Last 24 hours) at 06/05/2019 0918 Last data filed at 06/05/2019 0906 Gross per 24 hour  Intake 1280 ml  Output 5950 ml  Net -4670 ml     Physical Exam: Vital Signs Blood pressure 121/65, pulse 67, temperature 97.6 F (36.4 C), temperature source Oral, resp. rate 16, height 6' (1.829 m), weight 90.7 kg, SpO2 95 %. Constitutional: No distress . Vital signs reviewed. HEENT: EOMI, oral membranes moist Neck: supple Cardiovascular: RRR without murmur. No JVD    Respiratory: CTA Bilaterally without wheezes or rales. Normal effort    GI: BS +, non-tender, non-distended  Skin: decubitus wound stage III, 50% granulation Psych: pleasant and cooperative Musc: No edema in extremities.  No tenderness in extremities. Neurological: Alert Motor: Bilateral upper extremities: 4-4+/5 proximal to distal Right lower extremity: 4/5 hip flexion, 4/5 knee extension, 4/5 ankle dorsiflexion Left lower extremity: 4/5 hip flexion, knee extension,  3-5 ankle dorsiflexion Reasonable standing balance  Assessment/Plan: 1. Functional deficits secondary to debility which require 3+ hours per day of interdisciplinary therapy in a comprehensive inpatient rehab setting.  Physiatrist is providing close team supervision and 24 hour management of active medical problems listed below.  Physiatrist and rehab team continue to assess barriers to discharge/monitor  patient progress toward functional and medical goals  Care Tool:  Bathing    Body parts bathed by patient: Right arm, Left arm, Chest, Abdomen, Face   Body parts bathed by helper: Buttocks, Right upper leg, Left upper leg, Right lower leg, Left lower leg     Bathing assist Assist Level: Set up assist     Upper Body Dressing/Undressing Upper body dressing   What is the patient wearing?: Pull over shirt    Upper body assist Assist Level: Set up assist    Lower Body Dressing/Undressing Lower body dressing      What is the patient wearing?: Pants     Lower body assist Assist for lower body dressing: Contact Guard/Touching assist     Toileting Toileting Toileting Activity did not occur (Clothing management and hygiene only): N/A (no void or bm)  Toileting assist Assist for toileting: Contact Guard/Touching assist     Transfers Chair/bed transfer  Transfers assist  Chair/bed transfer activity did not occur: Safety/medical concerns  Chair/bed transfer assist level: Supervision/Verbal cueing     Locomotion Ambulation   Ambulation assist   Ambulation activity did not occur: Safety/medical concerns  Assist level: Contact Guard/Touching assist Assistive device: Walker-rolling Max distance: 392ft   Walk 10 feet activity   Assist  Walk 10 feet activity did not occur: Safety/medical concerns  Assist level: Contact Guard/Touching assist Assistive device: Walker-rolling, Orthosis   Walk 50 feet activity   Assist Walk 50 feet with 2 turns activity did not occur: Safety/medical concerns  Assist level: Contact Guard/Touching assist Assistive device: Walker-rolling, Orthosis    Walk 150 feet activity   Assist Walk 150 feet activity did not occur: Safety/medical concerns  Assist level:  Contact Guard/Touching assist Assistive device: Walker-rolling, Orthosis    Walk 10 feet on uneven surface  activity   Assist Walk 10 feet on uneven surfaces activity  did not occur: Safety/medical concerns   Assist level: Contact Guard/Touching assist Assistive device: Walker-rolling, Orthosis   Wheelchair     Assist Will patient use wheelchair at discharge?: (TBD) Type of Wheelchair: Manual Wheelchair activity did not occur: Refused         Wheelchair 50 feet with 2 turns activity    Assist    Wheelchair 50 feet with 2 turns activity did not occur: Refused       Wheelchair 150 feet activity     Assist  Wheelchair 150 feet activity did not occur: Refused       Blood pressure 121/65, pulse 67, temperature 97.6 F (36.4 C), temperature source Oral, resp. rate 16, height 6' (1.829 m), weight 90.7 kg, SpO2 95 %.  Medical Problem List and Plan: 1.Functional and mobility deficitssecondary to debility after Covid respiratory failure  Continue CIR, ELOS 9/26 2.A fib/Antithrombotics: -DVT/anticoagulation:Pharmaceutical:Other (comment)--Eliquis. -antiplatelet therapy: On Plavix. 3. Pain Management:oxycodone prn. 4. Mood:LCSW to follow for evaluation and support. -antipsychotic agents: N/A 5. Neuropsych: This patientiscapable of making decisions on hisown behalf. 6.Sacral decub/Skin/Wound Care: - air mattress. -Continue Santyl with damp to dry dressings to slough on larger decubitus  - Hydrotherapy ongoing--slowly progressing 7. Fluids/Electrolytes/Nutrition: encourage PO  -continue protein supp for low albumin 8. CAD/ICM: Not surgical candidate. Treated medically with metoprolol, Plavix and lipitor. 9. Hx fluid overload: Monitor for signs of over load and check weights daily. Cont lasix 40mg  po daily no current signs although the wt looks up    On chronic lasix with normal EF, monitor I/Os intake per pt is less than at home (home lasix  dose 80mg )  Filed Weights   06/03/19 0251 06/04/19 0506 06/05/19 0302  Weight: 100.5 kg 102.4 kg 90.7 kg      Weights are  all over the place, appears euvolemic 10 Hypokalemia:  Potassium 4.3 on 9/21  Continue supplement 11. T2DM with hyperglycemia: Hgb A1c- 7.7. Was on Toujeo, metformin and acarbose PTA. Acarbose added back on 9/08 with novolog for meal coverage.    -hesitate to resume metformin given his borderline Cr  -added amaryl 1mg  daily 9/17--increased to 2mg  9/22  -  lantus dced 9/22  -dc mealtime insulin today    CBG (last 3)  Recent Labs    06/04/19 1620 06/04/19 2134 06/05/19 0611  GLUCAP 87 156* 95    Controlled 9/24 12. Anxiety disorder: Provide ego support. Continue Xanax prn.  -scheduled 0.5mg  xanax at hs to help with sleep/breathing  -is  Improving with functional and medical gains 13. RA bilateral knees/hips: Managed by Dr. Amil Amen. Stable off methotrexate/ Actemra at this time.  14. Urinary retention:     Continue flomax--0.8mg  qhs  Remains unable to void thus far although has urge to void  -no cholinergics given his CAD -up to toilet or BSC to void  Continue I/O caths, lidocaine for comfort--needs to be applied before each cath  -pt doing self-caths  -make referal to outpt urology  13. COPD: Now on Dulera BID with nebs prn  Supplemental oxygen weaned 14. HTN:     Vitals:   06/04/19 2002 06/05/19 0302  BP: 122/64 121/65  Pulse: 73 67  Resp: 16 16  Temp: 98.1 F (36.7 C) 97.6 F (36.4 C)  SpO2: 97% 95%   Relatively controlled on 9/24 15. PAF: In  NSR normal rate  on metoprolol and Eliquis. 16.  Likely CKD III  Creatinine 1.23 9/21  Encourage fluids 17.  Anemia  Hemoglobin 8.4 9/21 18.  Dysphagia  D3 nectars, advance diet as tolerated  LOS: 14 days A FACE TO FACE EVALUATION WAS PERFORMED  Meredith Staggers 06/05/2019, 9:18 AM

## 2019-06-05 NOTE — Progress Notes (Signed)
Speech Language Pathology Daily Session Note  Patient Details  Name: VAUGHAN MOWAT MRN: [id number] Date of Birth: 1951-01-24  Today's Date: 06/05/2019 SLP Individual Time: 1415-1500 SLP Individual Time Calculation (min): 45 min and Today's Date: 06/05/2019 SLP Missed Time: 15 Minutes Missed Time Reason: Nursing care  Short Term Goals: Week 2: SLP Short Term Goal 1 (Week 2): Patient will consume current diet with minimal overt s/s of aspiration with Mod I for use of swallowing compensatory strategies. SLP Short Term Goal 2 (Week 2): Patient will demonstrate efficient mastication and complete oral clearance with trials of regular textures over 2 sessions with Mod I for use of swallowing compensatory strategies and without overt s/s of aspiration. SLP Short Term Goal 3 (Week 2): Patient will consume trials of thin liquids via cup with use of a chin tuck without overt s/s of aspiration with suprvision verbal cues over 3 sessions to assess readiness for repeat MBS. SLP Short Term Goal 4 (Week 2): Patient will complete pharyngeal strengthening exercies with supervision verbal cues.  Skilled Therapeutic Interventions: Skilled treatment session focused on dysphagia goals. Patient had repeat MBS today and was upgraded to thin liquids. SLP facilitated session by providing skilled observation with snack of Dys. 3 textures with thin liquids via cup. Patient consumed snack with overall Mod I for use of swallowing compensatory strategies with subtle and intermittent overt s/s of aspiration. Recommend patient continue current diet of Dys. 3 textures with thin liquids. SLP also provided continued education in regards to appropriate textures and need to continue pharyngeal strengthening exercises and EMST. Patient verbalized understanding and agreement. Patient left sitting EOB with all needs within reach. Continue with current plan of care.      Pain No/Denies Pain   Therapy/Group: Individual Therapy  Ambry Dix,  Daveena Elmore 06/05/2019, 3:08 PM

## 2019-06-05 NOTE — Progress Notes (Signed)
Social Work Patient ID: Robert [name], male   DOB: 1951/01/13, 68 y.o.   MRN: [id number] Referral made to [facility] for pt to be followed. Faxed information to them

## 2019-06-05 NOTE — Progress Notes (Signed)
Physical Therapy Session Note  Patient Details  Name: Robert [name] MRN: [id number] Date of Birth: [birth year]  Today's Date: 06/05/2019 PT Individual Time: 1300-1345 PT Individual Time Calculation (min): 45 min   Short Term Goals: Week 1:  PT Short Term Goal 1 (Week 1): Pt will complete bed mobility consistently with CGA. PT Short Term Goal 1 - Progress (Week 1): Met PT Short Term Goal 2 (Week 1): Pt will complete bed<>w/c transfers with mod assist +1. PT Short Term Goal 2 - Progress (Week 1): Met PT Short Term Goal 3 (Week 1): Pt will ambulate 15 ft with LRAD & max assist +1. PT Short Term Goal 3 - Progress (Week 1): Met PT Short Term Goal 4 (Week 1): Pt will propel w/c x 50 ft with BUE & supervision. PT Short Term Goal 4 - Progress (Week 1): Met Week 2:  PT Short Term Goal 1 (Week 2): Pt will perform sit <.> stands with CGA PT Short Term Goal 2 (Week 2): Pt will perform gait x 150' with CGA PT Short Term Goal 3 (Week 2): Pt will be able to perform 3 steps with min assist for home entry  Skilled Therapeutic Interventions/Progress Updates:    Received pt sitting in WC, pt in great spirits and agreeable to PT. Today's session focused on performing the DGI, stair navigation, gait, LE strength, and improved endurance. Pt navigated 4 steps x 2 trials CGA using bilateral hand rails. Pt continues to descend stairs sideways for safety. Pt ambulated 371f with RW CGA with verbal cues for energy conservation strategies if necessary. Pt completed DGI using RW with score 14/24 and required frequent rest breaks in between tasks. O2 sat 97% after DGI. Pt performed sit<>stands x 3 trials with RW CGA. Pt reported 8/10 fatigue at the end of the session but was proud of his accomplishments. PT assisted pt to bathroom and pt transferred WC<>toilet and toilet<>WC CGA. Concluded session with pt sitting in WC, all needs within reach, and no chair alarm per safety plan.   Therapy Documentation Precautions:   Precautions Precautions: Fall Precaution Comments: sacral wound, L AFO Required Braces or Orthoses: Other Brace Other Brace: B Prevalon boots Restrictions Weight Bearing Restrictions: No   Pain: Pt denied any pain during today's session.      Balance: Balance Balance Assessed: Yes Standardized Balance Assessment Standardized Balance Assessment: Dynamic Gait Index Dynamic Gait Index Level Surface: Mild Impairment Change in Gait Speed: Mild Impairment Gait with Horizontal Head Turns: Mild Impairment Gait with Vertical Head Turns: Mild Impairment Gait and Pivot Turn: Moderate Impairment Step Over Obstacle: Mild Impairment Step Around Obstacles: Mild Impairment Steps: Moderate Impairment Total Score: 14   Therapy/Group: Individual Therapy  AAlfonse AlpersPT, DPT   06/05/2019, 3:41 PM

## 2019-06-06 ENCOUNTER — Inpatient Hospital Stay (HOSPITAL_COMMUNITY): Payer: Medicare HMO

## 2019-06-06 ENCOUNTER — Encounter (HOSPITAL_COMMUNITY): Payer: Medicare HMO | Admitting: Speech Pathology

## 2019-06-06 ENCOUNTER — Encounter (HOSPITAL_COMMUNITY): Payer: Medicare HMO

## 2019-06-06 LAB — GLUCOSE, CAPILLARY
Glucose-Capillary: 120 mg/dL — ABNORMAL HIGH (ref 70–99)
Glucose-Capillary: 143 mg/dL — ABNORMAL HIGH (ref 70–99)
Glucose-Capillary: 213 mg/dL — ABNORMAL HIGH (ref 70–99)
Glucose-Capillary: 183 mg/dL — ABNORMAL HIGH (ref 70–99)

## 2019-06-06 MED ORDER — ACARBOSE 100 MG PO TABS: 100.0000 mg | ORAL_TABLET | Freq: Three times a day (TID) | ORAL | 0 refills | Status: AC

## 2019-06-06 MED ORDER — COLLAGENASE 250 UNIT/GM EX OINT: TOPICAL_OINTMENT | Freq: Every day | CUTANEOUS | 1 refills | Status: AC

## 2019-06-06 MED ORDER — MOMETASONE FURO-FORMOTEROL FUM 200-5 MCG/ACT IN AERO: 2.0000 | INHALATION_SPRAY | Freq: Two times a day (BID) | RESPIRATORY_TRACT | 0 refills | Status: AC

## 2019-06-06 MED ORDER — ATORVASTATIN CALCIUM 80 MG PO TABS: 80.0000 mg | ORAL_TABLET | Freq: Every day | ORAL | 0 refills | Status: DC

## 2019-06-06 MED ORDER — CLOPIDOGREL BISULFATE 75 MG PO TABS: ORAL_TABLET | ORAL | 0 refills | Status: DC

## 2019-06-06 MED ORDER — ASCORBIC ACID 250 MG PO TABS: 250.0000 mg | ORAL_TABLET | Freq: Two times a day (BID) | ORAL | 0 refills | Status: AC

## 2019-06-06 MED ORDER — ADULT MULTIVITAMIN W/MINERALS CH: 1.0000 | ORAL_TABLET | Freq: Every day | ORAL | Status: DC

## 2019-06-06 MED ORDER — TAMSULOSIN HCL 0.4 MG PO CAPS: 0.8000 mg | ORAL_CAPSULE | Freq: Every day | ORAL | 0 refills | Status: DC

## 2019-06-06 MED ORDER — FOLIC ACID 1 MG PO TABS: 1.0000 mg | ORAL_TABLET | Freq: Every day | ORAL | 0 refills | Status: AC

## 2019-06-06 MED ORDER — APIXABAN 5 MG PO TABS: 5.0000 mg | ORAL_TABLET | Freq: Two times a day (BID) | ORAL | 0 refills | Status: DC

## 2019-06-06 MED ORDER — ALPRAZOLAM 0.5 MG PO TABS: 0.5000 mg | ORAL_TABLET | Freq: Three times a day (TID) | ORAL | 0 refills | Status: AC | PRN

## 2019-06-06 MED ORDER — GLIMEPIRIDE 2 MG PO TABS: 2.0000 mg | ORAL_TABLET | Freq: Every day | ORAL | 0 refills | Status: AC

## 2019-06-06 MED ORDER — FUROSEMIDE 40 MG PO TABS: 40.0000 mg | ORAL_TABLET | Freq: Every day | ORAL | 0 refills | Status: DC

## 2019-06-06 MED ORDER — METOPROLOL TARTRATE 50 MG PO TABS: 50.0000 mg | ORAL_TABLET | Freq: Two times a day (BID) | ORAL | 0 refills | Status: DC

## 2019-06-06 MED ORDER — NYSTATIN 100000 UNIT/GM EX POWD: Freq: Three times a day (TID) | CUTANEOUS | 0 refills | Status: DC

## 2019-06-06 MED ORDER — GABAPENTIN 100 MG PO CAPS: 200.0000 mg | ORAL_CAPSULE | Freq: Three times a day (TID) | ORAL | 0 refills | Status: DC

## 2019-06-06 MED ORDER — POTASSIUM CHLORIDE CRYS ER 20 MEQ PO TBCR: 20.0000 meq | EXTENDED_RELEASE_TABLET | Freq: Two times a day (BID) | ORAL | 1 refills | Status: DC

## 2019-06-06 NOTE — Progress Notes (Signed)
[facility] [facility] [place] PHYSICAL MEDICINE & REHABILITATION PROGRESS NOTE   Subjective/Complaints:  feeling well and prepared for discharge  ROS: Patient denies fever, rash, sore throat, blurred vision, nausea, vomiting, diarrhea, cough, shortness of breath or chest pain, joint or back pain, headache, or mood change.    Objective:   Dg Swallowing Func-speech Pathology  Result Date: 06/05/2019 Objective Swallowing Evaluation: Type of Study: MBS-Modified Barium Swallow Study  Patient Details Name: Robert [name] MRN: [id number] Date of Birth: May 19, 1951 Today's Date: 06/05/2019 SLP Time Calculation (min) (ACUTE ONLY): 18 min Past Medical History: Past Medical History: Diagnosis Date . Anxiety  . COPD (chronic obstructive pulmonary disease) ([place])  . Coronary artery disease  . Full dentures  . GERD (gastroesophageal reflux disease)   Rolaids as needed . High cholesterol  . History of MI (myocardial infarction)  . Hypertension   med. dosage increased 04/2016; has been on BP med. x 10 yrs. . Incarcerated umbilical hernia 0000000 . Inguinal hernia 05/2016  bilateral  . Insulin dependent diabetes mellitus ([place])  . Ischemic cardiomyopathy   a. 03/2015 EF 40-45% by LV gram. . Left leg cellulitis 10/08/2016 . Rheumatoid arthritis(714.0)  Past Surgical History: Past Surgical History: Procedure Laterality Date . CARDIAC CATHETERIZATION N/A 02/01/2015  Procedure: Left Heart Cath and Coronary Angiography;  Surgeon: Belva Crome, MD;  Location: [place] [facility];  Service: Cardiovascular;  Laterality: N/A; . CARDIAC CATHETERIZATION N/A 03/22/2015  Procedure: Left Heart Cath and Coronary Angiography;  Surgeon: Leonie Man, MD;  Location: [place] [facility];  Service: Cardiovascular;  Laterality: N/A; . CARDIAC CATHETERIZATION  09/05/2002; 03/09/2003; 04/10/2005;06/02/2005; 05/09/2006; 02/09/2007; 03/01/2007 . COLONOSCOPY  2008  Dr. Gala Romney: diverticulosis, hyperplastic polyp. . CORONARY ANGIOPLASTY  11/14/2012; 02/01/2015 . CORONARY ANGIOPLASTY  WITH STENT PLACEMENT  05/16/2012  "1; makes total ~ 4" . CORONARY STENT INTERVENTION N/A 06/06/2017  Procedure: CORONARY STENT INTERVENTION;  Surgeon: Belva Crome, MD;  Location: [place] [facility];  Service: Cardiovascular;  Laterality: N/A; . INSERTION OF MESH Bilateral 05/24/2016  Procedure: INSERTION OF MESH;  Surgeon: Coralie Keens, MD;  Location: [place];  Service: General;  Laterality: Bilateral; . LAPAROSCOPIC CHOLECYSTECTOMY   . LAPAROSCOPIC INGUINAL HERNIA WITH UMBILICAL HERNIA Bilateral 05/24/2016  Procedure: BILATERAL LAPAROSCOPIC INGUINAL HERNIA REPAIR WITH MESH AND UMBILICAL HERNIA REPAIR WITH MESH;  Surgeon: Coralie Keens, MD;  Location: [place];  Service: General;  Laterality: Bilateral; . LEFT HEART CATH AND CORONARY ANGIOGRAPHY N/A 06/06/2017  Procedure: LEFT HEART CATH AND CORONARY ANGIOGRAPHY;  Surgeon: Belva Crome, MD;  Location: [place] [facility];  Service: Cardiovascular;  Laterality: N/A; . LEFT HEART CATHETERIZATION WITH CORONARY ANGIOGRAM N/A 12/22/2011  Procedure: LEFT HEART CATHETERIZATION WITH CORONARY ANGIOGRAM;  Surgeon: Troy Sine, MD;  Location: [facility] CATH LAB;  Service: Cardiovascular;  Laterality: N/A; . LEFT HEART CATHETERIZATION WITH CORONARY ANGIOGRAM Bilateral 05/16/2012  Procedure: LEFT HEART CATHETERIZATION WITH CORONARY ANGIOGRAM;  Surgeon: Lorretta Harp, MD;  Location: [facility] CATH LAB;  Service: Cardiovascular;  Laterality: Bilateral; . LEFT HEART CATHETERIZATION WITH CORONARY ANGIOGRAM N/A 11/14/2012  Procedure: LEFT HEART CATHETERIZATION WITH CORONARY ANGIOGRAM;  Surgeon: Troy Sine, MD;  Location: [facility] CATH LAB;  Service: Cardiovascular;  Laterality: N/A; . LEFT HEART CATHETERIZATION WITH CORONARY ANGIOGRAM N/A 09/01/2013  Procedure: LEFT HEART CATHETERIZATION WITH CORONARY ANGIOGRAM;  Surgeon: Leonie Man, MD;  Location: [facility] CATH LAB;  Service: Cardiovascular;  Laterality: N/A; . [place]; 1973; 1985  x 3 . NM  MYOCAR PERF  WALL MOTION  08/30/2009  No significant ischemia . OPEN REDUCTION INTERNAL FIXATION (ORIF) DISTAL RADIAL FRACTURE Right 12/10/2016  Procedure: OPEN REDUCTION INTERNAL FIXATION (ORIF) DISTAL RADIAL FRACTURE;  Surgeon: Iran Planas, MD;  Location: [place];  Service: Orthopedics;  Laterality: Right; . PERCUTANEOUS CORONARY INTERVENTION-BALLOON ONLY  12/22/2011  Procedure: PERCUTANEOUS CORONARY INTERVENTION-BALLOON ONLY;  Surgeon: Troy Sine, MD;  Location: [facility] CATH LAB;  Service: Cardiovascular;; . PERCUTANEOUS CORONARY INTERVENTION-BALLOON ONLY  11/14/2012  Procedure: PERCUTANEOUS CORONARY INTERVENTION-BALLOON ONLY;  Surgeon: Troy Sine, MD;  Location: [facility] CATH LAB;  Service: Cardiovascular;; . PERCUTANEOUS CORONARY STENT INTERVENTION (PCI-S)  05/16/2012  Procedure: PERCUTANEOUS CORONARY STENT INTERVENTION (PCI-S);  Surgeon: Lorretta Harp, MD;  Location: [facility] CATH LAB;  Service: Cardiovascular;; . PERCUTANEOUS CORONARY STENT INTERVENTION (PCI-S)  09/01/2013  Procedure: PERCUTANEOUS CORONARY STENT INTERVENTION (PCI-S);  Surgeon: Leonie Man, MD;  Location: [facility] CATH LAB;  Service: Cardiovascular;; HPI: See H&P  Subjective: Pt was pleasant and cooperative.  He was agreeable to MBS. Assessment / Plan / Recommendation CHL IP CLINICAL IMPRESSIONS 06/05/2019 Clinical Impression Pt presents with improved pharyngeal abilities over previous MBS. Pt continues to presents with mild pharyngeal dysphagia with swallow initiated at pyriform sinuses with varying levels of penetration during swallow of thin liquids. With cups sips, pt is able to protect airway but has some aspirates under vocal cords when consuming consecutive sips via straw. Pt with mild throat clear that was ineffective in clearing aspirates. Additionally, when consuming barium tablet with thin liquids, table lodged in vallecula with bolus of puree required to clear. Pt appears at reduced risk of aspiraiton when following general aspiraiton precautions.  Recommend continuing dysphagia 3 with thin liquids via cup, medicine whole in puree and intermittent supervision. SLP Visit Diagnosis Dysphagia, pharyngeal phase (R13.13) Attention and concentration deficit following -- Frontal lobe and executive function deficit following -- Impact on safety and function Mild aspiration risk   CHL IP TREATMENT RECOMMENDATION 06/05/2019 Treatment Recommendations Therapy as outlined in treatment plan below   Prognosis 05/29/2019 Prognosis for Safe Diet Advancement Good Barriers to Reach Goals Time post onset;Severity of deficits Barriers/Prognosis Comment -- CHL IP DIET RECOMMENDATION 06/05/2019 SLP Diet Recommendations Dysphagia 3 (Mech soft) solids;Thin liquid Liquid Administration via Cup Medication Administration Whole meds with puree Compensations Minimize environmental distractions;Slow rate;Small sips/bites Postural Changes Seated upright at 90 degrees   CHL IP OTHER RECOMMENDATIONS 06/05/2019 Recommended Consults -- Oral Care Recommendations Oral care BID Other Recommendations --   CHL IP FOLLOW UP RECOMMENDATIONS 06/05/2019 Follow up Recommendations Inpatient Rehab   CHL IP FREQUENCY AND DURATION 05/29/2019 Speech Therapy Frequency (ACUTE ONLY) min 5x/week Treatment Duration 2 weeks      CHL IP ORAL PHASE 06/05/2019 Oral Phase WFL Oral - Pudding Teaspoon -- Oral - Pudding Cup -- Oral - Honey Teaspoon -- Oral - Honey Cup -- Oral - Nectar Teaspoon -- Oral - Nectar Cup -- Oral - Nectar Straw -- Oral - Thin Teaspoon -- Oral - Thin Cup -- Oral - Thin Straw -- Oral - Puree -- Oral - Mech Soft -- Oral - Regular -- Oral - Multi-Consistency -- Oral - Pill -- Oral Phase - Comment --  CHL IP PHARYNGEAL PHASE 06/05/2019 Pharyngeal Phase Impaired Pharyngeal- Pudding Teaspoon -- Pharyngeal -- Pharyngeal- Pudding Cup -- Pharyngeal -- Pharyngeal- Honey Teaspoon -- Pharyngeal -- Pharyngeal- Honey Cup NT Pharyngeal -- Pharyngeal- Nectar Teaspoon NT Pharyngeal -- Pharyngeal- Nectar Cup -- Pharyngeal  -- Pharyngeal- Nectar Straw NT Pharyngeal -- Pharyngeal- Thin Teaspoon Delayed swallow initiation-pyriform  sinuses;Reduced anterior laryngeal mobility;Reduced laryngeal elevation;Penetration/Aspiration during swallow Pharyngeal Material enters airway, remains ABOVE vocal cords then ejected out Pharyngeal- Thin Cup Delayed swallow initiation-pyriform sinuses;Reduced anterior laryngeal mobility;Reduced laryngeal elevation;Penetration/Aspiration during swallow;Pharyngeal residue - valleculae Pharyngeal Material enters airway, remains ABOVE vocal cords then ejected out Pharyngeal- Thin Straw Delayed swallow initiation-pyriform sinuses;Reduced anterior laryngeal mobility;Reduced laryngeal elevation;Penetration/Aspiration during swallow Pharyngeal Material enters airway, CONTACTS cords and not ejected out Pharyngeal- Puree Delayed swallow initiation-vallecula Pharyngeal -- Pharyngeal- Mechanical Soft NT Pharyngeal -- Pharyngeal- Regular -- Pharyngeal -- Pharyngeal- Multi-consistency -- Pharyngeal -- Pharyngeal- Pill Delayed swallow initiation-vallecula;Reduced anterior laryngeal mobility;Reduced laryngeal elevation Pharyngeal -- Pharyngeal Comment barium tablet lodged in vallecula with silent aspiration of thin liquids, required puree to clear tablet  CHL IP CERVICAL ESOPHAGEAL PHASE 06/05/2019 Cervical Esophageal Phase WFL Pudding Teaspoon -- Pudding Cup -- Honey Teaspoon -- Honey Cup -- Nectar Teaspoon -- Nectar Cup -- Nectar Straw -- Thin Teaspoon -- Thin Cup -- Thin Straw -- Puree -- Mechanical Soft -- Regular -- Multi-consistency -- Pill -- Cervical Esophageal Comment -- Happi Overton 06/05/2019, 1:03 PM              No results for input(s): WBC, HGB, HCT, PLT in the last 72 hours. No results for input(s): NA, K, CL, CO2, GLUCOSE, BUN, CREATININE, CALCIUM in the last 72 hours.  Intake/Output Summary (Last 24 hours) at 06/06/2019 1108 Last data filed at 06/05/2019 2215 Gross per 24 hour  Intake 480 ml  Output  2350 ml  Net -1870 ml     Physical Exam: Vital Signs Blood pressure 113/73, pulse 70, temperature 97.8 F (36.6 C), resp. rate 18, height 6' (1.829 m), weight 90.7 kg, SpO2 94 %. Constitutional: No distress . Vital signs reviewed. HEENT: EOMI, oral membranes moist Neck: supple Cardiovascular: RRR without murmur. No JVD    Respiratory: CTA Bilaterally without wheezes or rales. Normal effort    GI: BS +, non-tender, non-distended  Skin: decubitus wound stage III, 50%+ granulation (I photo'ed but it didn't save)  Psych: pleasant and cooperative Musc: No edema in extremities.  No tenderness in extremities. Neurological: Alert Motor: Bilateral upper extremities: 4-4+/5 proximal to distal Right lower extremity: 4/5 hip flexion, 4/5 knee extension, 4/5 ankle dorsiflexion Left lower extremity: 4/5 hip flexion, knee extension,  3-5 ankle dorsiflexion Reasonable standing balance--still more proximal weakness  Assessment/Plan: 1. Functional deficits secondary to debility which require 3+ hours per day of interdisciplinary therapy in a comprehensive inpatient rehab setting.  Physiatrist is providing close team supervision and 24 hour management of active medical problems listed below.  Physiatrist and rehab team continue to assess barriers to discharge/monitor patient progress toward functional and medical goals  Care Tool:  Bathing    Body parts bathed by patient: Right arm, Left arm, Chest, Abdomen, Face, Front perineal area, Buttocks   Body parts bathed by helper: Buttocks, Right upper leg, Left upper leg, Right lower leg, Left lower leg     Bathing assist Assist Level: Set up assist     Upper Body Dressing/Undressing Upper body dressing   What is the patient wearing?: Pull over shirt    Upper body assist Assist Level: Set up assist    Lower Body Dressing/Undressing Lower body dressing      What is the patient wearing?: Pants     Lower body assist Assist for lower  body dressing: Contact Guard/Touching assist     Toileting Toileting Toileting Activity did not occur (Clothing management and hygiene only): N/A (no void or  bm)  Toileting assist Assist for toileting: Contact Guard/Touching assist(Pt had BM at 1:40pm)     Transfers Chair/bed transfer  Transfers assist  Chair/bed transfer activity did not occur: Safety/medical concerns  Chair/bed transfer assist level: Supervision/Verbal cueing     Locomotion Ambulation   Ambulation assist   Ambulation activity did not occur: Safety/medical concerns  Assist level: Contact Guard/Touching assist Assistive device: Walker-rolling Max distance: 380ft   Walk 10 feet activity   Assist  Walk 10 feet activity did not occur: Safety/medical concerns  Assist level: Contact Guard/Touching assist Assistive device: Walker-rolling   Walk 50 feet activity   Assist Walk 50 feet with 2 turns activity did not occur: Safety/medical concerns  Assist level: Contact Guard/Touching assist Assistive device: Walker-rolling    Walk 150 feet activity   Assist Walk 150 feet activity did not occur: Safety/medical concerns  Assist level: Contact Guard/Touching assist Assistive device: Walker-rolling    Walk 10 feet on uneven surface  activity   Assist Walk 10 feet on uneven surfaces activity did not occur: Safety/medical concerns   Assist level: Contact Guard/Touching assist Assistive device: Walker-rolling, Orthosis   Wheelchair     Assist Will patient use wheelchair at discharge?: (TBD) Type of Wheelchair: Manual Wheelchair activity did not occur: Refused         Wheelchair 50 feet with 2 turns activity    Assist    Wheelchair 50 feet with 2 turns activity did not occur: Refused       Wheelchair 150 feet activity     Assist  Wheelchair 150 feet activity did not occur: Refused       Blood pressure 113/73, pulse 70, temperature 97.8 F (36.6 C), resp. rate 18,  height 6' (1.829 m), weight 90.7 kg, SpO2 94 %.  Medical Problem List and Plan: 1.Functional and mobility deficitssecondary to debility after Covid respiratory failure  Finalize dc planning for 9/26  Patient to see Rehab MD/provider in the office for transitional care encounter in 1-2 weeks.  2.A fib/Antithrombotics: -DVT/anticoagulation:Pharmaceutical:Other (comment)--Eliquis. -antiplatelet therapy: On Plavix. 3. Pain Management:oxycodone prn. 4. Mood:LCSW to follow for evaluation and support. -antipsychotic agents: N/A 5. Neuropsych: This patientiscapable of making decisions on hisown behalf. 6.Sacral decub/Skin/Wound Care: - air mattress. -Continue Santyl with damp to dry dressings to slough on larger decubitus  - Hydrotherapy ongoing--  Progressing  -outpt wound care ctr follow up 7. Fluids/Electrolytes/Nutrition: encourage PO  -continue protein supp for low albumin 8. CAD/ICM: Not surgical candidate. Treated medically with metoprolol, Plavix and lipitor. 9. Hx fluid overload: Monitor for signs of over load and check weights daily. Cont lasix 40mg  po daily no current signs although the wt looks up    On chronic lasix with normal EF, monitor I/Os intake per pt is less than at home (home lasix  dose 80mg )  Filed Weights   06/03/19 0251 06/04/19 0506 06/05/19 0302  Weight: 100.5 kg 102.4 kg 90.7 kg      Weights remain inconsistent, appears euvolemic 10 Hypokalemia:  Potassium 4.3 on 9/21  Continue supplement 11. T2DM with hyperglycemia: Hgb A1c- 7.7. Was on Toujeo, metformin and acarbose PTA. Acarbose added back on 9/08 with novolog for meal coverage.    -hesitate to resume metformin given his borderline Cr  -added amaryl 1mg  daily 9/17--increased to 2mg  9/22  -  lantus dced 9/22  -dc mealtime insulin today    CBG (last 3)  Recent Labs    06/05/19 1115 06/05/19 1718 06/06/19 0639  GLUCAP 222* 164* 120*  Had been improving until yesterday---continue amaryl 2mg ---adjust after discharge if needed  12. Anxiety disorder: Provide ego support. Continue Xanax prn.  -scheduled 0.5mg  xanax at hs to help with sleep/breathing  -is  Improving with functional and medical gains 13. RA bilateral knees/hips: Managed by Dr. Amil Amen. Stable off methotrexate/ Actemra at this time.  14. Urinary retention:     Continue flomax--0.8mg  qhs  Remains unable to void thus far although has urge to void  -no cholinergics given his CAD -up to toilet or BSC to void  Continue I/O caths, lidocaine for comfort--needs to be applied before each cath  -pt doing self-caths  - outpt urology referral made 13. COPD: Now on Dulera BID with nebs prn  Supplemental oxygen weaned 14. HTN:     Vitals:   06/05/19 2100 06/06/19 0322  BP:  113/73  Pulse:  70  Resp:  18  Temp:  97.8 F (36.6 C)  SpO2: 91% 94%   Relatively controlled on 9/25 15. PAF: In NSR normal rate  on metoprolol and Eliquis. 16.  Likely CKD III  Creatinine 1.23 9/21  Encourage fluids 17.  Anemia  Hemoglobin 8.4 9/21 18.  Dysphagia  D3 nectars, advance diet as tolerated  LOS: 15 days A FACE TO FACE EVALUATION WAS PERFORMED  Meredith Staggers 06/06/2019, 11:08 AM

## 2019-06-06 NOTE — Progress Notes (Signed)
Occupational Therapy Discharge Summary  Patient Details  Name: Robert [name] MRN: [id number] Date of Birth: 04/27/1951  Today's Date: 06/06/2019 OT Individual Time: 1103-1130 OT Individual Time Calculation (min): 27 min   Pt received in w/c with daughter present for family education. Daughter educated on supervision for ADLs/transfers, w/c brake management, RW management, supervision of ambulation, providing A at hips for steadying PRN, posterior shower transfer method and energy conservation (handout provided). Pt reporting not receiving shower DME or wanting to buy AE as he is nearly able to reach his feet, however daughter educated on AE available to purchase. Daughter demonstrates competency with cuing and supervising bathroom transfers throughout session and all questions answered prior to direct handoff to SLP.   Patient has met 10 of 10 long term goals due to improved activity tolerance, improved balance, postural control, ability to compensate for deficits and improved coordination.  Patient to discharge at overall Supervision level.  Patient's care partner is independent to provide the necessary Supervision assistance at discharge.    Reasons goals not met: n/a  Recommendation:  Patient will benefit from ongoing skilled OT services in home health setting to continue to advance functional skills in the area of BADL and iADL.  Equipment: No equipment provided  Reasons for discharge: treatment goals met and discharge from hospital  Patient/family agrees with progress made and goals achieved: Yes  OT Discharge Precautions/Restrictions  Precautions Precautions: Fall Precaution Comments: sacral wound, supplemental oxygen Required Braces or Orthoses: Other Brace Other Brace: B Prevalon boots Restrictions Weight Bearing Restrictions: No General   Vital Signs Therapy Vitals Temp: 97.8 F (36.6 C) Pulse Rate: 70 Resp: 18 BP: 113/73 Patient Position (if appropriate):  Lying Oxygen Therapy SpO2: 94 % O2 Device: Room Air Pain Pain Assessment Pain Score: 0-No pain ADL ADL Eating: Independent Grooming: Independent Upper Body Bathing: Supervision/safety Where Assessed-Upper Body Bathing: Edge of bed Lower Body Bathing: Supervision/safety Where Assessed-Lower Body Bathing: Edge of bed(standing EOB) Upper Body Dressing: Modified independent (Device) Where Assessed-Upper Body Dressing: Edge of bed Lower Body Dressing: Supervision/safety Where Assessed-Lower Body Dressing: Edge of bed Toileting: Supervision/safety Toilet Transfer: Close supervision Tub/Shower Transfer: Close supervison Vision Baseline Vision/History: No visual deficits Patient Visual Report: No change from baseline Vision Assessment?: No apparent visual deficits Perception  Perception: Within Functional Limits Praxis Praxis: Intact Cognition Overall Cognitive Status: Within Functional Limits for tasks assessed Arousal/Alertness: Awake/alert Orientation Level: Oriented X4 Memory: Appears intact Awareness: Appears intact Problem Solving: Appears intact Reasoning: Appears intact Sequencing: Appears intact Safety/Judgment: Appears intact Sensation Sensation Light Touch: Impaired by gross assessment Proprioception: Appears Intact Coordination Gross Motor Movements are Fluid and Coordinated: Yes Fine Motor Movements are Fluid and Coordinated: Yes Coordination and Movement Description: generalized weakness is  L UE and LE Motor  Motor Motor: Abnormal postural alignment and control Motor - Skilled Clinical Observations: generalized deconditioning but improved since eval Mobility  Transfers Sit to Stand: Supervision/Verbal cueing Stand to Sit: Supervision/Verbal cueing  Trunk/Postural Assessment  Cervical Assessment Cervical Assessment: (head forward) Thoracic Assessment Thoracic Assessment: (rounded shoulders) Lumbar Assessment Lumbar Assessment: (post pelvic  tilt) Postural Control Postural Control: Within Functional Limits  Balance Balance Balance Assessed: Yes Static Standing Balance Static Standing - Balance Support: No upper extremity supported Static Standing - Level of Assistance: 6: Modified independent (Device/Increase time) Dynamic Standing Balance Dynamic Standing - Balance Support: Left upper extremity supported Dynamic Standing - Level of Assistance: 5: Stand by assistance Extremity/Trunk Assessment RUE Assessment RUE Assessment: Within Functional Limits Active Range of  Motion (AROM) Comments: 120 degrees AROM General Strength Comments: 4/5 MMT; generalized weakness LUE Assessment LUE Assessment: Within Functional Limits Active Range of Motion (AROM) Comments: 115 degrees AROM General Strength Comments: 4/5 MMT, generalized weakness   Tonny Branch 06/06/2019, 6:47 AM

## 2019-06-06 NOTE — Progress Notes (Signed)
Speech Language Pathology Discharge Summary  Patient Details  Name: Robert [name] MRN: [id number] Date of Birth: 1951-05-01  Today's Date: 06/06/2019 SLP Individual Time: 1130-1155 SLP Individual Time Calculation (min): 25 min   Skilled Therapeutic Interventions:  Skilled treatment session focused on completion of patient and family education with the patient's daughter. Both were educated in regards to patient's current swallowing function, diet recommendations, appropriate textures, compensatory strategies and medication administration. Both verbalized understanding and all questions answered. Patient left upright in the wheelchair with daughter present.   Patient has met 5 of 5 long term goals.  Patient to discharge at overall Modified Independent level.   Reasons goals not met: N/A   Clinical Impression/Discharge Summary: Patient has made excellent gains and has met 5 of 5 LTGs this admission. Currently, patient is consuming Dys. 3 textures with thin liquids with minimal overt s/s of aspiration and overall Mod I for use of swallowing compensatory strategies. Patient is consuming his medications whole in puree due to continued aspiration of thin liquids with pills. Patient also demonstrates improved cognitive functioning and is overall Mod I to complete functional and complex tasks safely in regards to problem solving and recall.  Patient and family education is complete and patient will discharge home with assistance from family. Patient would benefit from f/u SLP services to maximize his swallowing function.   Care Partner:  Caregiver Able to Provide Assistance: Yes     Recommendation:  24 hour supervision/assistance;Home Health SLP  Rationale for SLP Follow Up: Maximize swallowing safety   Equipment: N/A   Reasons for discharge: Treatment goals met;Discharged from hospital   Patient/Family Agrees with Progress Made and Goals Achieved: Yes    [place], [place] 06/06/2019, 6:36  AM

## 2019-06-06 NOTE — Progress Notes (Signed)
Physical Therapy Discharge Summary  Patient Details  Name: Robert [name] MRN: [id number] Date of Birth: 08/22/1951  Today's Date: 06/06/2019 PT Individual Time: 1300-1400  PT Individual Time Calculation (min): 60 min   Received pt sitting in WC, pt agreeable to PT. Today's session focused on functional tasks in preparation for D/C. Pt performed bed mobility independently but had the RW nearby for stability and safety. Pt performed car transfer supervision with RW. Pt navigated 12 steps using bilateral handrails CGA with verbal cues to slow pace. Pt ambulated 10ft over uneven surface with RW supervision and reported greater ease with this task during this session than previously. Pt picked up cup from floor using RW supervision. Pt ambulated 350ft on even surface with RW supervision with no c/o of fatigue or discomfort. Pt was given HEP consisting of standing mini squats, standing HS curls, standing hip abduction, and bilateral tandem balance all using countertop for support. Pt was given a copy of these exercises and verbalized understanding. Concluded session with pt sitting in WC, needs within reach, and alarm off per safety plan.    Patient has met 9 of 9 long term goals due to improved activity tolerance, improved balance, improved postural control, increased strength, improved attention, improved awareness and improved coordination.  Patient to discharge at an ambulatory level Supervision with RW. Patient's care partner is independent to provide the necessary physical assistance at discharge.  Reasons goals not met: N/A  Recommendation:  Patient will benefit from ongoing skilled PT services in home health setting to continue to advance safe functional mobility, address ongoing impairments in activity tolerance, strength, functional mobility, balance and coordination, and minimize fall risk.  Equipment: AFO, pt already had RW at home.   Reasons for discharge: treatment goals  met  Patient/family agrees with progress made and goals achieved: Yes  PT Discharge Precautions/Restrictions Precautions Precautions: Fall Precaution Comments: sacral wound Required Braces or Orthoses: Other Brace Other Brace: AFO   Pain  Pt denied any pain during today's session.     Cognition Overall Cognitive Status: Within Functional Limits for tasks assessed Arousal/Alertness: Awake/alert Orientation Level: Oriented X4 Attention: Focused Focused Attention: Appears intact Memory: Impaired Memory Impairment: Decreased short term memory Awareness: Appears intact Problem Solving: Appears intact Safety/Judgment: Appears intact Sensation Sensation Light Touch: Impaired Detail Light Touch Impaired Details: Absent LLE;Impaired LLE(Absent sensation below knee on LLE) Proprioception: Appears Intact Coordination Gross Motor Movements are Fluid and Coordinated: Yes Motor  Motor Motor: Abnormal postural alignment and control Motor - Skilled Clinical Observations: General deconditioning requires RW  Mobility Bed Mobility Bed Mobility: Rolling Right;Rolling Left;Sit to Supine;Supine to Sit Rolling Right: Independent Rolling Left: Independent Supine to Sit: Independent Sit to Supine: Independent Scooting to HOB: Independent Transfers Transfers: Sit to Stand;Stand to Sit;Stand Pivot Transfers Sit to Stand: Supervision/Verbal cueing Stand to Sit: Supervision/Verbal cueing Stand Pivot Transfers: Supervision/Verbal cueing Stand Pivot Transfer Details: Verbal cues for precautions/safety Transfer (Assistive device): Rolling walker Locomotion  Gait Ambulation: Yes Gait Assistance: Supervision/Verbal cueing Gait Distance (Feet): 350 Feet Assistive device: Rolling walker Gait Assistance Details: Verbal cues for precautions/safety Stairs / Additional Locomotion Stairs: Yes Stairs Assistance: Contact Guard/Touching assist Stair Management Technique: Two rails Number of  Stairs: 12 Height of Stairs: 6  Trunk/Postural Assessment  Postural Control Trunk Control: Decreased postural control and alignment, relies on RW.  Balance Static Sitting Balance Static Sitting - Level of Assistance: 6: Modified independent (Device/Increase time) Static Standing Balance Static Standing - Balance Support: Bilateral upper extremity supported(RW) Static   Standing - Level of Assistance: 5: Stand by assistance Dynamic Standing Balance Dynamic Standing - Balance Support: Bilateral upper extremity supported(RW) Dynamic Standing - Level of Assistance: 5: Stand by assistance Extremity Assessment      RLE Assessment RLE Assessment: Within Functional Limits LLE Assessment LLE Assessment: Exceptions to WFL General Strength Comments: L knee flexion 3-/5, L ankle PF/DF 1/5     PT, DPT   M  06/06/2019, 2:41 PM   

## 2019-06-06 NOTE — Progress Notes (Signed)
Physical Therapy Wound Treatment Patient Details  Name: Robert [name] MRN: [id number] Date of Birth: [birth year]  Today's Date: 06/06/2019 PT Individual Time: [phone] PT Individual Time Calculation (min): 37 min   Subjective  Subjective: Pt pleasant and agreeable to hydrotherapy.  Patient and Family Stated Goals: Heal wound, keep it from getting worse Date of Onset: (Unknown) Prior Treatments: Santyl and wet>dry dressing changes  Pain Score:    Wound Assessment  Pressure Injury 04/17/19 Sacrum Medial Unstageable - Full thickness tissue loss in which the base of the ulcer is covered by slough (yellow, tan, gray, green or brown) and/or eschar (tan, brown or black) in the wound bed. dark purple with some skin sloug (Active)  Dressing Type Barrier Film (skin prep);Gauze (Comment);Moist to dry 06/06/19 1034  Dressing Clean;Dry;Intact;Changed 06/06/19 1034  Dressing Change Frequency Daily 06/06/19 1034  State of Healing Non-healing 06/06/19 1034  Site / Wound Assessment Yellow;Red 06/06/19 1034  % Wound base Red or Granulating 75% 06/06/19 1034  % Wound base Yellow/Fibrinous Exudate 25% 06/06/19 1034  % Wound base Black/Eschar 0% 06/06/19 1034  Peri-wound Assessment Intact;Purple 06/06/19 1034  Wound Length (cm) 2 cm 05/27/19 1132  Wound Width (cm) 0.5 cm 05/27/19 1132  Wound Depth (cm) 0 cm 05/27/19 1132  Wound Surface Area (cm^2) 1 cm^2 05/27/19 1132  Wound Volume (cm^3) 0 cm^3 05/27/19 1132  Margins Unattached edges (unapproximated) 06/06/19 1034  Drainage Amount Minimal 06/06/19 1034  Drainage Description Serous 06/06/19 1034  Treatment Hydrotherapy (Pulse lavage);Packing (Saline gauze) 06/06/19 1034   Santyl applied to wound bed prior to applying dressing.   Pressure Injury 04/17/19 Buttocks Right;Lower Unstageable - Full thickness tissue loss in which the base of the ulcer is covered by slough (yellow, tan, gray, green or brown) and/or eschar (tan, brown or black) in the wound  bed. (Active)  Dressing Type Barrier Film (skin prep);Gauze (Comment);Moist to dry 06/06/19 1034  Dressing Clean;Intact;Dry;Changed 06/06/19 1034  Dressing Change Frequency Daily 06/06/19 1034  State of Healing Eschar 06/06/19 1034  Site / Wound Assessment Yellow;Pink 06/06/19 1034  % Wound base Red or Granulating 50% 06/06/19 1034  % Wound base Yellow/Fibrinous Exudate 50% 06/06/19 1034  % Wound base Black/Eschar 0% 06/06/19 1034  % Wound base Other/Granulation Tissue (Comment) 0% 06/06/19 1034  Peri-wound Assessment Erythema (blanchable);Pink 06/06/19 1034  Wound Length (cm) 6 cm 05/27/19 1132  Wound Width (cm) 4 cm 05/27/19 1132  Wound Depth (cm) 0.5 cm 06/03/19 1338  Wound Surface Area (cm^2) 24 cm^2 05/27/19 1132  Wound Volume (cm^3) 0 cm^3 05/27/19 1132  Margins Unattached edges (unapproximated) 06/06/19 1034  Drainage Amount None 06/06/19 1034  Drainage Description Serous 06/06/19 1034  Treatment Debridement (Selective);Hydrotherapy (Pulse lavage);Packing (Saline gauze) 06/06/19 1034   Santyl applied to wound bed prior to applying dressing.   Pressure Injury 04/17/19 Buttocks Left;Lower Unstageable - Full thickness tissue loss in which the base of the ulcer is covered by slough (yellow, tan, gray, green or brown) and/or eschar (tan, brown or black) in the wound bed. WOC updated to Stage 3 on  (Active)  Dressing Type Barrier Film (skin prep);Gauze (Comment) 06/06/19 1034  Dressing Clean;Dry;Intact;Changed 06/06/19 1034  Dressing Change Frequency Daily 06/06/19 1034  State of Healing Epithelialized 06/06/19 1034  Site / Wound Assessment Pink 06/06/19 1034  % Wound base Red or Granulating 100% 06/06/19 1034  % Wound base Yellow/Fibrinous Exudate 0% 06/06/19 1034  % Wound base Black/Eschar 0% 06/06/19 1034  Peri-wound Assessment Erythema (blanchable) 06/06/19 1034  Wound  Length (cm) 1.5 cm 05/27/19 1132  Wound Width (cm) 0.5 cm 05/27/19 1132  Wound Depth (cm) 0 cm 05/27/19 1132   Wound Surface Area (cm^2) 0.75 cm^2 05/27/19 1132  Wound Volume (cm^3) 0 cm^3 05/27/19 1132  Margins Unattached edges (unapproximated) 06/06/19 1034  Drainage Amount None 06/06/19 1034  Drainage Description Serosanguineous 06/05/19 1512  Treatment Hydrotherapy (Pulse lavage) 06/06/19 1034      Hydrotherapy Pulsed lavage therapy - wound location: bilateral buttock wounds and coccyx Pulsed Lavage with Suction (psi): 12 psi Pulsed Lavage with Suction - Normal Saline Used: 1000 mL Pulsed Lavage Tip: Tip with splash shield Selective Debridement Selective Debridement - Location: rt buttock wound Selective Debridement - Tools Used: Forceps;Scalpel Selective Debridement - Tissue Removed: very minimal yellow necrotic tissue from rt buttock   Wound Assessment and Plan  Wound Therapy - Assess/Plan/Recommendations Wound Therapy - Clinical Statement: Lt buttock wound now appears totally epitheliazed. Rt buttock wound with thin layer of adherent necrotic tissue Wound Therapy - Functional Problem List: Decreased tolerance for position changes due to skin breakdown, decreased mobility in general.  Factors Delaying/Impairing Wound Healing: Diabetes Mellitus;Immobility;Multiple medical problems Hydrotherapy Plan: Debridement;Dressing change;Patient/family education;Pulsatile lavage with suction Wound Therapy - Frequency: 6X / week Wound Therapy - Follow Up Recommendations: Home health RN Wound Plan: See above  Wound Therapy Goals- Improve the function of patient's integumentary system by progressing the wound(s) through the phases of wound healing (inflammation - proliferation - remodeling) by: Decrease Necrotic Tissue to: 0 Decrease Necrotic Tissue - Progress: Progressing toward goal Increase Granulation Tissue to: 100 Increase Granulation Tissue - Progress: Progressing toward goal  Goals will be updated until maximal potential achieved or discharge criteria met.  Discharge criteria: when goals  achieved, discharge from hospital, MD decision/surgical intervention, no progress towards goals, refusal/missing three consecutive treatments without notification or medical reason.  GP     Shary Decamp [facility] 06/06/2019, 10:41 AM [place] Pager [phone] Office [phone]

## 2019-06-07 LAB — GLUCOSE, CAPILLARY: Glucose-Capillary: 134 mg/dL — ABNORMAL HIGH (ref 70–99)

## 2019-06-07 NOTE — Progress Notes (Signed)
Robert [name] PHYSICAL MEDICINE & REHABILITATION PROGRESS NOTE   Subjective/Complaints:  no new complaints. Had a few questions re: his meds  ROS: Patient denies fever, rash, sore throat, blurred vision, nausea, vomiting, diarrhea, cough, shortness of breath or chest pain, joint or back pain, headache, or mood change.   Objective:   Dg Swallowing Func-speech Pathology  Result Date: 06/05/2019 Objective Swallowing Evaluation: Type of Study: MBS-Modified Barium Swallow Study  Patient Details Name: Robert [name] MRN: [id number] Date of Birth: Aug 25, 1951 Today's Date: 06/05/2019 SLP Time Calculation (min) (ACUTE ONLY): 18 min Past Medical History: Past Medical History: Diagnosis Date . Anxiety  . COPD (chronic obstructive pulmonary disease) ([place])  . Coronary artery disease  . Full dentures  . GERD (gastroesophageal reflux disease)   Rolaids as needed . High cholesterol  . History of MI (myocardial infarction)  . Hypertension   med. dosage increased 04/2016; has been on BP med. x 10 yrs. . Incarcerated umbilical hernia 0000000 . Inguinal hernia 05/2016  bilateral  . Insulin dependent diabetes mellitus ([place])  . Ischemic cardiomyopathy   a. 03/2015 EF 40-45% by LV gram. . Left leg cellulitis 10/08/2016 . Rheumatoid arthritis(714.0)  Past Surgical History: Past Surgical History: Procedure Laterality Date . CARDIAC CATHETERIZATION N/A 02/01/2015  Procedure: Left Heart Cath and Coronary Angiography;  Surgeon: Belva Crome, MD;  Location: [place] [facility];  Service: Cardiovascular;  Laterality: N/A; . CARDIAC CATHETERIZATION N/A 03/22/2015  Procedure: Left Heart Cath and Coronary Angiography;  Surgeon: Leonie Man, MD;  Location: [place] [facility];  Service: Cardiovascular;  Laterality: N/A; . CARDIAC CATHETERIZATION  09/05/2002; 03/09/2003; 04/10/2005;06/02/2005; 05/09/2006; 02/09/2007; 03/01/2007 . COLONOSCOPY  2008  Dr. Gala Romney: diverticulosis, hyperplastic polyp. . CORONARY ANGIOPLASTY  11/14/2012; 02/01/2015 . CORONARY  ANGIOPLASTY WITH STENT PLACEMENT  05/16/2012  "1; makes total ~ 4" . CORONARY STENT INTERVENTION N/A 06/06/2017  Procedure: CORONARY STENT INTERVENTION;  Surgeon: Belva Crome, MD;  Location: [place] [facility];  Service: Cardiovascular;  Laterality: N/A; . INSERTION OF MESH Bilateral 05/24/2016  Procedure: INSERTION OF MESH;  Surgeon: Coralie Keens, MD;  Location: [place];  Service: General;  Laterality: Bilateral; . LAPAROSCOPIC CHOLECYSTECTOMY   . LAPAROSCOPIC INGUINAL HERNIA WITH UMBILICAL HERNIA Bilateral 05/24/2016  Procedure: BILATERAL LAPAROSCOPIC INGUINAL HERNIA REPAIR WITH MESH AND UMBILICAL HERNIA REPAIR WITH MESH;  Surgeon: Coralie Keens, MD;  Location: [place];  Service: General;  Laterality: Bilateral; . LEFT HEART CATH AND CORONARY ANGIOGRAPHY N/A 06/06/2017  Procedure: LEFT HEART CATH AND CORONARY ANGIOGRAPHY;  Surgeon: Belva Crome, MD;  Location: [place] [facility];  Service: Cardiovascular;  Laterality: N/A; . LEFT HEART CATHETERIZATION WITH CORONARY ANGIOGRAM N/A 12/22/2011  Procedure: LEFT HEART CATHETERIZATION WITH CORONARY ANGIOGRAM;  Surgeon: Troy Sine, MD;  Location: [facility] CATH LAB;  Service: Cardiovascular;  Laterality: N/A; . LEFT HEART CATHETERIZATION WITH CORONARY ANGIOGRAM Bilateral 05/16/2012  Procedure: LEFT HEART CATHETERIZATION WITH CORONARY ANGIOGRAM;  Surgeon: Lorretta Harp, MD;  Location: [facility] CATH LAB;  Service: Cardiovascular;  Laterality: Bilateral; . LEFT HEART CATHETERIZATION WITH CORONARY ANGIOGRAM N/A 11/14/2012  Procedure: LEFT HEART CATHETERIZATION WITH CORONARY ANGIOGRAM;  Surgeon: Troy Sine, MD;  Location: [facility] CATH LAB;  Service: Cardiovascular;  Laterality: N/A; . LEFT HEART CATHETERIZATION WITH CORONARY ANGIOGRAM N/A 09/01/2013  Procedure: LEFT HEART CATHETERIZATION WITH CORONARY ANGIOGRAM;  Surgeon: Leonie Man, MD;  Location: [facility] CATH LAB;  Service: Cardiovascular;  Laterality: N/A; . [place]; 1973; 1985   x 3 .  NM MYOCAR PERF WALL MOTION  08/30/2009  No significant ischemia . OPEN REDUCTION INTERNAL FIXATION (ORIF) DISTAL RADIAL FRACTURE Right 12/10/2016  Procedure: OPEN REDUCTION INTERNAL FIXATION (ORIF) DISTAL RADIAL FRACTURE;  Surgeon: Iran Planas, MD;  Location: [place];  Service: Orthopedics;  Laterality: Right; . PERCUTANEOUS CORONARY INTERVENTION-BALLOON ONLY  12/22/2011  Procedure: PERCUTANEOUS CORONARY INTERVENTION-BALLOON ONLY;  Surgeon: Troy Sine, MD;  Location: [facility] CATH LAB;  Service: Cardiovascular;; . PERCUTANEOUS CORONARY INTERVENTION-BALLOON ONLY  11/14/2012  Procedure: PERCUTANEOUS CORONARY INTERVENTION-BALLOON ONLY;  Surgeon: Troy Sine, MD;  Location: [facility] CATH LAB;  Service: Cardiovascular;; . PERCUTANEOUS CORONARY STENT INTERVENTION (PCI-S)  05/16/2012  Procedure: PERCUTANEOUS CORONARY STENT INTERVENTION (PCI-S);  Surgeon: Lorretta Harp, MD;  Location: [facility] CATH LAB;  Service: Cardiovascular;; . PERCUTANEOUS CORONARY STENT INTERVENTION (PCI-S)  09/01/2013  Procedure: PERCUTANEOUS CORONARY STENT INTERVENTION (PCI-S);  Surgeon: Leonie Man, MD;  Location: [facility] CATH LAB;  Service: Cardiovascular;; HPI: See H&P  Subjective: Pt was pleasant and cooperative.  He was agreeable to MBS. Assessment / Plan / Recommendation CHL IP CLINICAL IMPRESSIONS 06/05/2019 Clinical Impression Pt presents with improved pharyngeal abilities over previous MBS. Pt continues to presents with mild pharyngeal dysphagia with swallow initiated at pyriform sinuses with varying levels of penetration during swallow of thin liquids. With cups sips, pt is able to protect airway but has some aspirates under vocal cords when consuming consecutive sips via straw. Pt with mild throat clear that was ineffective in clearing aspirates. Additionally, when consuming barium tablet with thin liquids, table lodged in vallecula with bolus of puree required to clear. Pt appears at reduced risk of aspiraiton when following general aspiraiton  precautions. Recommend continuing dysphagia 3 with thin liquids via cup, medicine whole in puree and intermittent supervision. SLP Visit Diagnosis Dysphagia, pharyngeal phase (R13.13) Attention and concentration deficit following -- Frontal lobe and executive function deficit following -- Impact on safety and function Mild aspiration risk   CHL IP TREATMENT RECOMMENDATION 06/05/2019 Treatment Recommendations Therapy as outlined in treatment plan below   Prognosis 05/29/2019 Prognosis for Safe Diet Advancement Good Barriers to Reach Goals Time post onset;Severity of deficits Barriers/Prognosis Comment -- CHL IP DIET RECOMMENDATION 06/05/2019 SLP Diet Recommendations Dysphagia 3 (Mech soft) solids;Thin liquid Liquid Administration via Cup Medication Administration Whole meds with puree Compensations Minimize environmental distractions;Slow rate;Small sips/bites Postural Changes Seated upright at 90 degrees   CHL IP OTHER RECOMMENDATIONS 06/05/2019 Recommended Consults -- Oral Care Recommendations Oral care BID Other Recommendations --   CHL IP FOLLOW UP RECOMMENDATIONS 06/05/2019 Follow up Recommendations Inpatient Rehab   CHL IP FREQUENCY AND DURATION 05/29/2019 Speech Therapy Frequency (ACUTE ONLY) min 5x/week Treatment Duration 2 weeks      CHL IP ORAL PHASE 06/05/2019 Oral Phase WFL Oral - Pudding Teaspoon -- Oral - Pudding Cup -- Oral - Honey Teaspoon -- Oral - Honey Cup -- Oral - Nectar Teaspoon -- Oral - Nectar Cup -- Oral - Nectar Straw -- Oral - Thin Teaspoon -- Oral - Thin Cup -- Oral - Thin Straw -- Oral - Puree -- Oral - Mech Soft -- Oral - Regular -- Oral - Multi-Consistency -- Oral - Pill -- Oral Phase - Comment --  CHL IP PHARYNGEAL PHASE 06/05/2019 Pharyngeal Phase Impaired Pharyngeal- Pudding Teaspoon -- Pharyngeal -- Pharyngeal- Pudding Cup -- Pharyngeal -- Pharyngeal- Honey Teaspoon -- Pharyngeal -- Pharyngeal- Honey Cup NT Pharyngeal -- Pharyngeal- Nectar Teaspoon NT Pharyngeal -- Pharyngeal- Nectar Cup --  Pharyngeal -- Pharyngeal- Nectar Straw NT Pharyngeal -- Pharyngeal- Thin Teaspoon  Delayed swallow initiation-pyriform sinuses;Reduced anterior laryngeal mobility;Reduced laryngeal elevation;Penetration/Aspiration during swallow Pharyngeal Material enters airway, remains ABOVE vocal cords then ejected out Pharyngeal- Thin Cup Delayed swallow initiation-pyriform sinuses;Reduced anterior laryngeal mobility;Reduced laryngeal elevation;Penetration/Aspiration during swallow;Pharyngeal residue - valleculae Pharyngeal Material enters airway, remains ABOVE vocal cords then ejected out Pharyngeal- Thin Straw Delayed swallow initiation-pyriform sinuses;Reduced anterior laryngeal mobility;Reduced laryngeal elevation;Penetration/Aspiration during swallow Pharyngeal Material enters airway, CONTACTS cords and not ejected out Pharyngeal- Puree Delayed swallow initiation-vallecula Pharyngeal -- Pharyngeal- Mechanical Soft NT Pharyngeal -- Pharyngeal- Regular -- Pharyngeal -- Pharyngeal- Multi-consistency -- Pharyngeal -- Pharyngeal- Pill Delayed swallow initiation-vallecula;Reduced anterior laryngeal mobility;Reduced laryngeal elevation Pharyngeal -- Pharyngeal Comment barium tablet lodged in vallecula with silent aspiration of thin liquids, required puree to clear tablet  CHL IP CERVICAL ESOPHAGEAL PHASE 06/05/2019 Cervical Esophageal Phase WFL Pudding Teaspoon -- Pudding Cup -- Honey Teaspoon -- Honey Cup -- Nectar Teaspoon -- Nectar Cup -- Nectar Straw -- Thin Teaspoon -- Thin Cup -- Thin Straw -- Puree -- Mechanical Soft -- Regular -- Multi-consistency -- Pill -- Cervical Esophageal Comment -- Robert [name] 06/05/2019, 1:03 PM              No results for input(s): WBC, HGB, HCT, PLT in the last 72 hours. No results for input(s): NA, K, CL, CO2, GLUCOSE, BUN, CREATININE, CALCIUM in the last 72 hours.  Intake/Output Summary (Last 24 hours) at 06/07/2019 0912 Last data filed at 06/07/2019 0800 Gross per 24 hour  Intake 960 ml   Output 4775 ml  Net -3815 ml     Physical Exam: Vital Signs Blood pressure 122/66, pulse 71, temperature 97.8 F (36.6 C), temperature source Oral, resp. rate 18, height 6' (1.829 m), weight 90.7 kg, SpO2 91 %. Constitutional: No distress . Vital signs reviewed. HEENT: EOMI, oral membranes moist Neck: supple Cardiovascular: RRR without murmur. No JVD    Respiratory: CTA Bilaterally without wheezes or rales. Normal effort    GI: BS +, non-tender, non-distended   Skin: decubitus not viewed today  Psych: pleasant and cooperative Musc: No edema in extremities.  No tenderness in extremities. Neurological: Alert Motor: Bilateral upper extremities: 4-4+/5 proximal to distal Right lower extremity: 4/5 hip flexion, 4/5 knee extension, 4/5 ankle dorsiflexion Left lower extremity: 4/5 hip flexion, knee extension,  3-5 ankle dorsiflexion Reasonable standing balance--still more proximal weakness  Assessment/Plan: 1. Functional deficits secondary to debility which require 3+ hours per day of interdisciplinary therapy in a comprehensive inpatient rehab setting.  Physiatrist is providing close team supervision and 24 hour management of active medical problems listed below.  Physiatrist and rehab team continue to assess barriers to discharge/monitor patient progress toward functional and medical goals  Care Tool:  Bathing    Body parts bathed by patient: Right arm, Left arm, Chest, Abdomen, Face, Front perineal area, Buttocks, Right upper leg, Right lower leg, Left lower leg   Body parts bathed by helper: Buttocks, Right upper leg, Left upper leg, Right lower leg, Left lower leg     Bathing assist Assist Level: Supervision/Verbal cueing     Upper Body Dressing/Undressing Upper body dressing   What is the patient wearing?: Pull over shirt    Upper body assist Assist Level: Supervision/Verbal cueing    Lower Body Dressing/Undressing Lower body dressing      What is the patient  wearing?: Pants     Lower body assist Assist for lower body dressing: Supervision/Verbal cueing     Toileting Toileting Toileting Activity did not occur Landscape architect and hygiene  only): N/A (no void or bm)  Toileting assist Assist for toileting: Supervision/Verbal cueing     Transfers Chair/bed transfer  Transfers assist  Chair/bed transfer activity did not occur: Safety/medical concerns  Chair/bed transfer assist level: Supervision/Verbal cueing     Locomotion Ambulation   Ambulation assist   Ambulation activity did not occur: Safety/medical concerns  Assist level: Supervision/Verbal cueing Assistive device: Walker-rolling Max distance: 319ft   Walk 10 feet activity   Assist  Walk 10 feet activity did not occur: Safety/medical concerns  Assist level: Supervision/Verbal cueing Assistive device: Walker-rolling, Orthosis   Walk 50 feet activity   Assist Walk 50 feet with 2 turns activity did not occur: Safety/medical concerns  Assist level: Supervision/Verbal cueing Assistive device: Walker-rolling, Orthosis    Walk 150 feet activity   Assist Walk 150 feet activity did not occur: Safety/medical concerns  Assist level: Supervision/Verbal cueing Assistive device: Walker-rolling, Orthosis    Walk 10 feet on uneven surface  activity   Assist Walk 10 feet on uneven surfaces activity did not occur: Safety/medical concerns   Assist level: Supervision/Verbal cueing Assistive device: Walker-rolling, Orthosis   Wheelchair     Assist Will patient use wheelchair at discharge?: (TBD) Type of Wheelchair: Manual Wheelchair activity did not occur: Refused         Wheelchair 50 feet with 2 turns activity    Assist    Wheelchair 50 feet with 2 turns activity did not occur: Refused       Wheelchair 150 feet activity     Assist  Wheelchair 150 feet activity did not occur: Refused       Blood pressure 122/66, pulse 71,  temperature 97.8 F (36.6 C), temperature source Oral, resp. rate 18, height 6' (1.829 m), weight 90.7 kg, SpO2 91 %.  Medical Problem List and Plan: 1.Functional and mobility deficitssecondary to debility after Covid respiratory failure  Dc home today  -HH, wound care, urology f/u  Patient to see Rehab MD/provider in the office for transitional care encounter in 1-2 weeks.  2.A fib/Antithrombotics: -DVT/anticoagulation:Pharmaceutical:Other (comment)--Eliquis. -antiplatelet therapy: On Plavix. 3. Pain Management:oxycodone prn. 4. Mood:LCSW to follow for evaluation and support. -antipsychotic agents: N/A 5. Neuropsych: This patientiscapable of making decisions on hisown behalf. 6.Sacral decub/Skin/Wound Care: - air mattress. -Continue Santyl with damp to dry dressings to slough on larger decubitus  - Hydrotherapy ongoing--  Progressing  -outpt wound care ctr follow up 7. Fluids/Electrolytes/Nutrition: encourage PO  -continue protein supp for low albumin 8. CAD/ICM: Not surgical candidate. Treated medically with metoprolol, Plavix and lipitor. 9. Hx fluid overload: Monitor for signs of over load and check weights daily. Cont lasix 40mg  po daily no current signs although the wt looks up    On chronic lasix with normal EF, monitor I/Os intake per pt is less than at home (home lasix  dose 80mg )  Filed Weights   06/03/19 0251 06/04/19 0506 06/05/19 0302  Weight: 100.5 kg 102.4 kg 90.7 kg      Weights readings inconsistent, appears euvolemic 10 Hypokalemia:  Potassium 4.3 on 9/21  Continue supplement 11. T2DM with hyperglycemia: Hgb A1c- 7.7. Was on Toujeo, metformin and acarbose PTA. Acarbose added back on 9/08 with novolog for meal coverage.    -hesitate to resume metformin given his borderline Cr  -added amaryl 1mg  daily 9/17--increased to 2mg  9/22  -  lantus dced 9/22  -dc mealtime insulin stopped 9/24    CBG  (last 3)  Recent Labs    06/06/19 1644 06/06/19 2143  06/07/19 0632  GLUCAP 213* 183* 134*    -reviewed meds with pt, will not resume metformin as above  -adjust amaryl as outp as needed--asked that pt record CBG's at home  12. Anxiety disorder: Provide ego support. Continue Xanax prn.   -is  Improving with functional and medical gains 13. RA bilateral knees/hips: Managed by Dr. Amil Amen. Stable off methotrexate/ Actemra at this time.  14. Urinary retention:     Continue flomax--0.8mg  qhs  Remains unable to void thus far although has urge to void  -no cholinergics given his CAD -up to toilet or BSC to void  Continue I/O caths, lidocaine for comfort--needs to be applied before each cath  -pt doing self-caths  - outpt urology referral made 13. COPD: Now on Dulera BID with nebs prn  Supplemental oxygen weaned 14. HTN:     Vitals:   06/06/19 2053 06/07/19 0553  BP: 125/67 122/66  Pulse: 81 71  Resp: 19 18  Temp: 98.4 F (36.9 C) 97.8 F (36.6 C)  SpO2: 95% 91%   Relatively controlled on 9/26 15. PAF: In NSR normal rate  on metoprolol and Eliquis. 16.  Likely CKD III  Creatinine 1.23 9/21  Encourage fluids 17.  Anemia  Hemoglobin 8.4 9/21 18.  Dysphagia  D3 nectars, advance diet as tolerated  LOS: 16 days A FACE TO FACE EVALUATION WAS PERFORMED  Meredith Staggers 06/07/2019, 9:12 AM

## 2019-06-07 NOTE — Progress Notes (Addendum)
Pt discharged to home accompanied by Nurse and NT wheeled down in wheelchair. All personal belongings in hand. Discharge teaching provided by Hadassah Pais on 06/06/2019. Pt refused last hydrotherapy and morning meds prior to discharge. No further questions nor concerns. Larina Earthly

## 2019-06-09 DIAGNOSIS — U071 COVID-19: Secondary | ICD-10-CM | POA: Diagnosis not present

## 2019-06-09 DIAGNOSIS — L89312 Pressure ulcer of right buttock, stage 2: Secondary | ICD-10-CM | POA: Diagnosis not present

## 2019-06-09 DIAGNOSIS — E1122 Type 2 diabetes mellitus with diabetic chronic kidney disease: Secondary | ICD-10-CM | POA: Diagnosis not present

## 2019-06-09 DIAGNOSIS — A419 Sepsis, unspecified organism: Secondary | ICD-10-CM | POA: Diagnosis not present

## 2019-06-09 DIAGNOSIS — I130 Hypertensive heart and chronic kidney disease with heart failure and stage 1 through stage 4 chronic kidney disease, or unspecified chronic kidney disease: Secondary | ICD-10-CM | POA: Diagnosis not present

## 2019-06-09 DIAGNOSIS — I5023 Acute on chronic systolic (congestive) heart failure: Secondary | ICD-10-CM | POA: Diagnosis not present

## 2019-06-09 DIAGNOSIS — R339 Retention of urine, unspecified: Secondary | ICD-10-CM | POA: Diagnosis not present

## 2019-06-09 DIAGNOSIS — L89310 Pressure ulcer of right buttock, unstageable: Secondary | ICD-10-CM | POA: Diagnosis not present

## 2019-06-09 DIAGNOSIS — I5043 Acute on chronic combined systolic (congestive) and diastolic (congestive) heart failure: Secondary | ICD-10-CM | POA: Diagnosis not present

## 2019-06-09 DIAGNOSIS — M069 Rheumatoid arthritis, unspecified: Secondary | ICD-10-CM | POA: Diagnosis not present

## 2019-06-09 DIAGNOSIS — I081 Rheumatic disorders of both mitral and tricuspid valves: Secondary | ICD-10-CM | POA: Diagnosis not present

## 2019-06-09 DIAGNOSIS — D631 Anemia in chronic kidney disease: Secondary | ICD-10-CM | POA: Diagnosis not present

## 2019-06-09 DIAGNOSIS — I0981 Rheumatic heart failure: Secondary | ICD-10-CM | POA: Diagnosis not present

## 2019-06-09 DIAGNOSIS — J1289 Other viral pneumonia: Secondary | ICD-10-CM | POA: Diagnosis not present

## 2019-06-09 DIAGNOSIS — N183 Chronic kidney disease, stage 3 unspecified: Secondary | ICD-10-CM | POA: Diagnosis not present

## 2019-06-10 ENCOUNTER — Telehealth: Payer: Self-pay

## 2019-06-10 NOTE — Telephone Encounter (Signed)
Transitional Care call-patient    1. Are you/is patient experiencing any problems since coming home? No Are there any questions regarding any aspect of care? No 2. Are there any questions regarding medications administration/dosing? What medication to take. Went over medication discharged with and what medications to d/c at discharge Are meds being taken as prescribed? Yes Patient should review meds with caller to confirm 3. Have there been any falls? No 4. Has Home Health been to the house and/or have they contacted you? Yes If not, have you tried to contact them? Can we help you contact them? 5. Are bowels and bladder emptying properly? Yes Are there any unexpected incontinence issues? No If applicable, is patient following bowel/bladder programs? 6. Any fevers, problems with breathing, unexpected pain? No 7. Are there any skin problems or new areas of breakdown? No 8. Has the patient/family member arranged specialty MD follow up (ie cardiology/neurology/renal/surgical/etc)? Yes Can we help arrange? 9. Does the patient need any other services or support that we can help arrange? No 10. Are caregivers following through as expected in assisting the patient? Yes 11. Has the patient quit smoking, drinking alcohol, or using drugs as recommended? Yes  Appointment time 1:00pm arrive time 12:40pm on 06/18/2019 with [place] [address]

## 2019-06-11 DIAGNOSIS — N183 Chronic kidney disease, stage 3 unspecified: Secondary | ICD-10-CM | POA: Diagnosis not present

## 2019-06-11 DIAGNOSIS — I5023 Acute on chronic systolic (congestive) heart failure: Secondary | ICD-10-CM | POA: Diagnosis not present

## 2019-06-11 DIAGNOSIS — J1289 Other viral pneumonia: Secondary | ICD-10-CM | POA: Diagnosis not present

## 2019-06-11 DIAGNOSIS — I130 Hypertensive heart and chronic kidney disease with heart failure and stage 1 through stage 4 chronic kidney disease, or unspecified chronic kidney disease: Secondary | ICD-10-CM | POA: Diagnosis not present

## 2019-06-11 DIAGNOSIS — D631 Anemia in chronic kidney disease: Secondary | ICD-10-CM | POA: Diagnosis not present

## 2019-06-11 DIAGNOSIS — U071 COVID-19: Secondary | ICD-10-CM | POA: Diagnosis not present

## 2019-06-11 DIAGNOSIS — R3914 Feeling of incomplete bladder emptying: Secondary | ICD-10-CM | POA: Diagnosis not present

## 2019-06-11 DIAGNOSIS — E1122 Type 2 diabetes mellitus with diabetic chronic kidney disease: Secondary | ICD-10-CM | POA: Diagnosis not present

## 2019-06-11 DIAGNOSIS — R350 Frequency of micturition: Secondary | ICD-10-CM | POA: Diagnosis not present

## 2019-06-11 DIAGNOSIS — R3912 Poor urinary stream: Secondary | ICD-10-CM | POA: Diagnosis not present

## 2019-06-11 DIAGNOSIS — M069 Rheumatoid arthritis, unspecified: Secondary | ICD-10-CM | POA: Diagnosis not present

## 2019-06-11 DIAGNOSIS — L89310 Pressure ulcer of right buttock, unstageable: Secondary | ICD-10-CM | POA: Diagnosis not present

## 2019-06-12 DIAGNOSIS — Z9049 Acquired absence of other specified parts of digestive tract: Secondary | ICD-10-CM | POA: Diagnosis not present

## 2019-06-12 DIAGNOSIS — Z872 Personal history of diseases of the skin and subcutaneous tissue: Secondary | ICD-10-CM | POA: Diagnosis not present

## 2019-06-12 DIAGNOSIS — L89319 Pressure ulcer of right buttock, unspecified stage: Secondary | ICD-10-CM | POA: Diagnosis not present

## 2019-06-12 DIAGNOSIS — Z955 Presence of coronary angioplasty implant and graft: Secondary | ICD-10-CM | POA: Diagnosis not present

## 2019-06-12 DIAGNOSIS — L89313 Pressure ulcer of right buttock, stage 3: Secondary | ICD-10-CM | POA: Diagnosis not present

## 2019-06-12 DIAGNOSIS — E119 Type 2 diabetes mellitus without complications: Secondary | ICD-10-CM | POA: Diagnosis not present

## 2019-06-12 DIAGNOSIS — I4891 Unspecified atrial fibrillation: Secondary | ICD-10-CM | POA: Diagnosis not present

## 2019-06-12 DIAGNOSIS — Z833 Family history of diabetes mellitus: Secondary | ICD-10-CM | POA: Diagnosis not present

## 2019-06-12 DIAGNOSIS — I10 Essential (primary) hypertension: Secondary | ICD-10-CM | POA: Diagnosis not present

## 2019-06-12 DIAGNOSIS — Z8619 Personal history of other infectious and parasitic diseases: Secondary | ICD-10-CM | POA: Diagnosis not present

## 2019-06-12 DIAGNOSIS — E785 Hyperlipidemia, unspecified: Secondary | ICD-10-CM | POA: Diagnosis not present

## 2019-06-13 DIAGNOSIS — N183 Chronic kidney disease, stage 3 unspecified: Secondary | ICD-10-CM | POA: Diagnosis not present

## 2019-06-13 DIAGNOSIS — J1289 Other viral pneumonia: Secondary | ICD-10-CM | POA: Diagnosis not present

## 2019-06-13 DIAGNOSIS — L89310 Pressure ulcer of right buttock, unstageable: Secondary | ICD-10-CM | POA: Diagnosis not present

## 2019-06-13 DIAGNOSIS — E1122 Type 2 diabetes mellitus with diabetic chronic kidney disease: Secondary | ICD-10-CM | POA: Diagnosis not present

## 2019-06-13 DIAGNOSIS — I130 Hypertensive heart and chronic kidney disease with heart failure and stage 1 through stage 4 chronic kidney disease, or unspecified chronic kidney disease: Secondary | ICD-10-CM | POA: Diagnosis not present

## 2019-06-13 DIAGNOSIS — M069 Rheumatoid arthritis, unspecified: Secondary | ICD-10-CM | POA: Diagnosis not present

## 2019-06-13 DIAGNOSIS — D631 Anemia in chronic kidney disease: Secondary | ICD-10-CM | POA: Diagnosis not present

## 2019-06-13 DIAGNOSIS — U071 COVID-19: Secondary | ICD-10-CM | POA: Diagnosis not present

## 2019-06-13 DIAGNOSIS — I5023 Acute on chronic systolic (congestive) heart failure: Secondary | ICD-10-CM | POA: Diagnosis not present

## 2019-06-17 DIAGNOSIS — I5023 Acute on chronic systolic (congestive) heart failure: Secondary | ICD-10-CM | POA: Diagnosis not present

## 2019-06-17 DIAGNOSIS — D631 Anemia in chronic kidney disease: Secondary | ICD-10-CM | POA: Diagnosis not present

## 2019-06-17 DIAGNOSIS — Z6829 Body mass index (BMI) 29.0-29.9, adult: Secondary | ICD-10-CM | POA: Diagnosis not present

## 2019-06-17 DIAGNOSIS — J1289 Other viral pneumonia: Secondary | ICD-10-CM | POA: Diagnosis not present

## 2019-06-17 DIAGNOSIS — F419 Anxiety disorder, unspecified: Secondary | ICD-10-CM | POA: Diagnosis not present

## 2019-06-17 DIAGNOSIS — E1122 Type 2 diabetes mellitus with diabetic chronic kidney disease: Secondary | ICD-10-CM | POA: Diagnosis not present

## 2019-06-17 DIAGNOSIS — N183 Chronic kidney disease, stage 3 unspecified: Secondary | ICD-10-CM | POA: Diagnosis not present

## 2019-06-17 DIAGNOSIS — E1140 Type 2 diabetes mellitus with diabetic neuropathy, unspecified: Secondary | ICD-10-CM | POA: Diagnosis not present

## 2019-06-17 DIAGNOSIS — M069 Rheumatoid arthritis, unspecified: Secondary | ICD-10-CM | POA: Diagnosis not present

## 2019-06-17 DIAGNOSIS — L89310 Pressure ulcer of right buttock, unstageable: Secondary | ICD-10-CM | POA: Diagnosis not present

## 2019-06-17 DIAGNOSIS — I130 Hypertensive heart and chronic kidney disease with heart failure and stage 1 through stage 4 chronic kidney disease, or unspecified chronic kidney disease: Secondary | ICD-10-CM | POA: Diagnosis not present

## 2019-06-17 DIAGNOSIS — Z23 Encounter for immunization: Secondary | ICD-10-CM | POA: Diagnosis not present

## 2019-06-17 DIAGNOSIS — J449 Chronic obstructive pulmonary disease, unspecified: Secondary | ICD-10-CM | POA: Diagnosis not present

## 2019-06-17 DIAGNOSIS — U071 COVID-19: Secondary | ICD-10-CM | POA: Diagnosis not present

## 2019-06-18 ENCOUNTER — Encounter: Payer: Self-pay | Admitting: *Deleted

## 2019-06-18 ENCOUNTER — Encounter: Payer: Medicare HMO | Admitting: Registered Nurse

## 2019-06-18 DIAGNOSIS — Z006 Encounter for examination for normal comparison and control in clinical research program: Secondary | ICD-10-CM

## 2019-06-18 NOTE — Research (Signed)
DAL[facility] Research study: telephone call to inform patient that kit # [id number] expires 12-Jul-2019. Patient states he started taking IP on 30/sep/2020. He will review kit numbers and stop taking medication from expired kit on 12-Jul-2019. I instructed him to call me back for any questions.

## 2019-06-19 DIAGNOSIS — M069 Rheumatoid arthritis, unspecified: Secondary | ICD-10-CM | POA: Diagnosis not present

## 2019-06-19 DIAGNOSIS — N183 Chronic kidney disease, stage 3 unspecified: Secondary | ICD-10-CM | POA: Diagnosis not present

## 2019-06-19 DIAGNOSIS — I5023 Acute on chronic systolic (congestive) heart failure: Secondary | ICD-10-CM | POA: Diagnosis not present

## 2019-06-19 DIAGNOSIS — J1289 Other viral pneumonia: Secondary | ICD-10-CM | POA: Diagnosis not present

## 2019-06-19 DIAGNOSIS — L89310 Pressure ulcer of right buttock, unstageable: Secondary | ICD-10-CM | POA: Diagnosis not present

## 2019-06-19 DIAGNOSIS — E1122 Type 2 diabetes mellitus with diabetic chronic kidney disease: Secondary | ICD-10-CM | POA: Diagnosis not present

## 2019-06-19 DIAGNOSIS — U071 COVID-19: Secondary | ICD-10-CM | POA: Diagnosis not present

## 2019-06-19 DIAGNOSIS — I130 Hypertensive heart and chronic kidney disease with heart failure and stage 1 through stage 4 chronic kidney disease, or unspecified chronic kidney disease: Secondary | ICD-10-CM | POA: Diagnosis not present

## 2019-06-19 DIAGNOSIS — D631 Anemia in chronic kidney disease: Secondary | ICD-10-CM | POA: Diagnosis not present

## 2019-06-20 ENCOUNTER — Encounter: Payer: Medicare HMO | Attending: Registered Nurse | Admitting: Registered Nurse

## 2019-06-20 ENCOUNTER — Other Ambulatory Visit: Payer: Self-pay

## 2019-06-20 ENCOUNTER — Encounter: Payer: Self-pay | Admitting: Registered Nurse

## 2019-06-20 VITALS — BP 138/69 | HR 75 | Temp 97.9°F | Ht 72.0 in | Wt 220.0 lb

## 2019-06-20 DIAGNOSIS — J1289 Other viral pneumonia: Secondary | ICD-10-CM | POA: Diagnosis not present

## 2019-06-20 DIAGNOSIS — E1165 Type 2 diabetes mellitus with hyperglycemia: Secondary | ICD-10-CM | POA: Insufficient documentation

## 2019-06-20 DIAGNOSIS — F064 Anxiety disorder due to known physiological condition: Secondary | ICD-10-CM | POA: Diagnosis not present

## 2019-06-20 DIAGNOSIS — D631 Anemia in chronic kidney disease: Secondary | ICD-10-CM | POA: Diagnosis not present

## 2019-06-20 DIAGNOSIS — N183 Chronic kidney disease, stage 3 unspecified: Secondary | ICD-10-CM | POA: Diagnosis not present

## 2019-06-20 DIAGNOSIS — E1122 Type 2 diabetes mellitus with diabetic chronic kidney disease: Secondary | ICD-10-CM | POA: Diagnosis not present

## 2019-06-20 DIAGNOSIS — R5381 Other malaise: Secondary | ICD-10-CM | POA: Diagnosis not present

## 2019-06-20 DIAGNOSIS — U071 COVID-19: Secondary | ICD-10-CM | POA: Diagnosis not present

## 2019-06-20 DIAGNOSIS — M069 Rheumatoid arthritis, unspecified: Secondary | ICD-10-CM | POA: Diagnosis not present

## 2019-06-20 DIAGNOSIS — R339 Retention of urine, unspecified: Secondary | ICD-10-CM | POA: Diagnosis not present

## 2019-06-20 DIAGNOSIS — I130 Hypertensive heart and chronic kidney disease with heart failure and stage 1 through stage 4 chronic kidney disease, or unspecified chronic kidney disease: Secondary | ICD-10-CM | POA: Diagnosis not present

## 2019-06-20 DIAGNOSIS — I5023 Acute on chronic systolic (congestive) heart failure: Secondary | ICD-10-CM | POA: Diagnosis not present

## 2019-06-20 DIAGNOSIS — L89310 Pressure ulcer of right buttock, unstageable: Secondary | ICD-10-CM | POA: Diagnosis not present

## 2019-06-20 NOTE — Progress Notes (Signed)
Subjective:    Patient ID: Robert [name], male    DOB: 05-Aug-1951, 68 y.o.   MRN: [id number]  HPI: Robert [name] is a 68 y.o. 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   Robert [name] called EMS on 04/11/2019 with respiratory distress, he was transported to [facility] with respiratory distress requiring intubation and mechanical ventilation. He was found to be SARS COVID-19 positive.   Robert [name] had a prolonged hospital stay, he developed MRSA pneumonia and further septic shock. AKI with urinary retention, Atrial Fibrillation with RVR.   See Discharge Summary: Dr. Cathlean Sauer and Reesa Chew PA-C for further details.  Chest X-Ray:  IMPRESSION: Bilateral bronchopneumonia left worse than right. This could be due to bacterial or viral pneumonia.  CT Head WO Contrast:  IMPRESSION: 1. No acute intracranial hemorrhage. 2. Mild age-related atrophy and chronic microvascular ischemic changes. 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  Robert [name] was admitted to inpatient rehabilitation on 05/22/2019 and discharged home on 06/07/2019. He is receiving outpatient therapy at [facility]. He states he has pain in his bilateral hips and bilateral knees. He rates his pain 2. Also reports he has a good appetite.   Daughter in room.   Pain Inventory Average Pain 2 Pain Right Now 2 My pain is burning  In the last 24 hours, has pain interfered with the following? General activity 0 Relation with others 0 Enjoyment of life 0 What TIME of day is your pain at its worst? morning Sleep (in general) Good  Pain is worse with: some activites Pain improves with: rest Relief from Meds: 6  Mobility walk with assistance use a walker how many minutes can you walk? 20-30 ability to climb steps?  yes do you  drive?  no  Function retired I need assistance with the following:  household duties  Neuro/Psych bladder control problems weakness trouble walking  Prior Studies Any changes since last visit?  no  Physicians involved in your care Primary care [place]   Family History  Problem Relation Age of Onset  . Heart disease Father   . Colon cancer Brother        early 9s  . Hypertension Mother   . Heart disease Mother   . Colon cancer Mother        died age 33. late 49s early 7s at diagnosis.   . Lung cancer Mother    Social History   Socioeconomic History  . Marital status: Widowed    Spouse name: Not on file  . Number of children: Not on file  . Years of education: Not on file  . Highest education level: Not on file  Occupational History  . Occupation: Clinical biochemist  Social Needs  . Financial resource strain: Not on file  . Food insecurity    Worry: Not on file    Inability: Not on file  . Transportation needs    Medical: Not on file    Non-medical: Not on file  Tobacco Use  . Smoking status: Current Some Day Smoker    Packs/day: 1.00    Types: Cigarettes    Last attempt to quit: 06/11/2015    Years since quitting: 4.0  . Smokeless tobacco: Never Used  . Tobacco comment: pt states smoking less than half a pack  Substance and Sexual Activity  . Alcohol use: No  . Drug use: No  . Sexual activity: Yes  Lifestyle  . Physical activity    Days per week: Not on file    Minutes per session: Not on file  . Stress: Not on file  Relationships  . Social Herbalist on phone: Not on file    Gets together: Not on file    Attends religious service: Not on file    Active member of club or organization: Not on file    Attends meetings of clubs or organizations: Not on file    Relationship status: Not on file  Other Topics Concern  . Not on file  Social History Narrative  . Not on file   Past Surgical History:  Procedure  Laterality Date  . CARDIAC CATHETERIZATION N/A 02/01/2015   Procedure: Left Heart Cath and Coronary Angiography;  Surgeon: Belva Crome, MD;  Location: [place] [facility];  Service: Cardiovascular;  Laterality: N/A;  . CARDIAC CATHETERIZATION N/A 03/22/2015   Procedure: Left Heart Cath and Coronary Angiography;  Surgeon: Leonie Man, MD;  Location: [place] [facility];  Service: Cardiovascular;  Laterality: N/A;  . CARDIAC CATHETERIZATION  09/05/2002; 03/09/2003; 04/10/2005;06/02/2005; 05/09/2006; 02/09/2007; 03/01/2007  . COLONOSCOPY  2008   Dr. Gala Romney: diverticulosis, hyperplastic polyp.  . CORONARY ANGIOPLASTY  11/14/2012; 02/01/2015  . CORONARY ANGIOPLASTY WITH STENT PLACEMENT  05/16/2012   "1; makes total ~ 4"  . CORONARY STENT INTERVENTION N/A 06/06/2017   Procedure: CORONARY STENT INTERVENTION;  Surgeon: Belva Crome, MD;  Location: [place] [facility];  Service: Cardiovascular;  Laterality: N/A;  . INSERTION OF MESH Bilateral 05/24/2016   Procedure: INSERTION OF MESH;  Surgeon: Coralie Keens, MD;  Location: [place];  Service: General;  Laterality: Bilateral;  . LAPAROSCOPIC CHOLECYSTECTOMY    . LAPAROSCOPIC INGUINAL HERNIA WITH UMBILICAL HERNIA Bilateral 05/24/2016   Procedure: BILATERAL LAPAROSCOPIC INGUINAL HERNIA REPAIR WITH MESH AND UMBILICAL HERNIA REPAIR WITH MESH;  Surgeon: Coralie Keens, MD;  Location: [place];  Service: General;  Laterality: Bilateral;  . LEFT HEART CATH AND CORONARY ANGIOGRAPHY N/A 06/06/2017   Procedure: LEFT HEART CATH AND CORONARY ANGIOGRAPHY;  Surgeon: Belva Crome, MD;  Location: [place] [facility];  Service: Cardiovascular;  Laterality: N/A;  . LEFT HEART CATHETERIZATION WITH CORONARY ANGIOGRAM N/A 12/22/2011   Procedure: LEFT HEART CATHETERIZATION WITH CORONARY ANGIOGRAM;  Surgeon: Troy Sine, MD;  Location: [facility] CATH LAB;  Service: Cardiovascular;  Laterality: N/A;  . LEFT HEART CATHETERIZATION WITH CORONARY ANGIOGRAM  Bilateral 05/16/2012   Procedure: LEFT HEART CATHETERIZATION WITH CORONARY ANGIOGRAM;  Surgeon: Lorretta Harp, MD;  Location: [facility] CATH LAB;  Service: Cardiovascular;  Laterality: Bilateral;  . LEFT HEART CATHETERIZATION WITH CORONARY ANGIOGRAM N/A 11/14/2012   Procedure: LEFT HEART CATHETERIZATION WITH CORONARY ANGIOGRAM;  Surgeon: Troy Sine, MD;  Location: [facility] CATH LAB;  Service: Cardiovascular;  Laterality: N/A;  . LEFT HEART CATHETERIZATION WITH CORONARY ANGIOGRAM N/A 09/01/2013   Procedure: LEFT HEART CATHETERIZATION WITH CORONARY ANGIOGRAM;  Surgeon: Leonie Man, MD;  Location: [facility] CATH LAB;  Service: Cardiovascular;  Laterality: N/A;  . [place]; 1973; 1985   x 3  . NM MYOCAR PERF WALL MOTION  08/30/2009   No significant ischemia  . OPEN REDUCTION INTERNAL FIXATION (ORIF) DISTAL RADIAL FRACTURE Right 12/10/2016   Procedure: OPEN REDUCTION INTERNAL FIXATION (ORIF) DISTAL RADIAL FRACTURE;  Surgeon: Iran Planas, MD;  Location: [place];  Service: Orthopedics;  Laterality: Right;  . PERCUTANEOUS CORONARY INTERVENTION-BALLOON ONLY  12/22/2011   Procedure: PERCUTANEOUS CORONARY INTERVENTION-BALLOON ONLY;  Surgeon: Troy Sine, MD;  Location: [facility] CATH LAB;  Service: Cardiovascular;;  . PERCUTANEOUS CORONARY INTERVENTION-BALLOON ONLY  11/14/2012   Procedure: PERCUTANEOUS CORONARY INTERVENTION-BALLOON ONLY;  Surgeon: Troy Sine, MD;  Location: [facility] CATH LAB;  Service: Cardiovascular;;  . PERCUTANEOUS CORONARY STENT INTERVENTION (PCI-S)  05/16/2012   Procedure: PERCUTANEOUS CORONARY STENT INTERVENTION (PCI-S);  Surgeon: Lorretta Harp, MD;  Location: [facility] CATH LAB;  Service: Cardiovascular;;  . PERCUTANEOUS CORONARY STENT INTERVENTION (PCI-S)  09/01/2013   Procedure: PERCUTANEOUS CORONARY STENT INTERVENTION (PCI-S);  Surgeon: Leonie Man, MD;  Location: [facility] CATH LAB;  Service: Cardiovascular;;   Past Medical History:  Diagnosis Date  . Anxiety   . COPD (chronic obstructive  pulmonary disease) ([place])   . Coronary artery disease   . Full dentures   . GERD (gastroesophageal reflux disease)    Rolaids as needed  . High cholesterol   . History of MI (myocardial infarction)   . Hypertension    med. dosage increased 04/2016; has been on BP med. x 10 yrs.  . Incarcerated umbilical hernia 0000000  . Inguinal hernia 05/2016   bilateral   . Insulin dependent diabetes mellitus ([place])   . Ischemic cardiomyopathy    a. 03/2015 EF 40-45% by LV gram.  . Left leg cellulitis 10/08/2016  . Rheumatoid arthritis(714.0)    BP 138/69   Pulse 75   Temp 97.9 F (36.6 C)   Ht 6' (1.829 m)   Wt 220 lb (99.8 kg)   SpO2 96%   BMI 29.84 kg/m   Opioid Risk Score:   Fall Risk Score:  `1  Depression screen PHQ 2/9  Depression screen PHQ 2/9 06/20/2019  Decreased Interest 0  Down, Depressed, Hopeless 0  PHQ - 2 Score 0    Review of Systems  Musculoskeletal: Positive for gait problem.  Neurological: Positive for weakness.  All other systems reviewed and are negative.      Objective:   Physical Exam Vitals signs and nursing note reviewed.  Constitutional:      Appearance: Normal appearance.  Neck:     Musculoskeletal: Normal range of motion and neck supple.  Cardiovascular:     Rate and Rhythm: Normal rate and regular rhythm.     Pulses: Normal pulses.     Heart sounds: Normal heart sounds.  Pulmonary:     Effort: Pulmonary effort is normal.     Breath sounds: Normal breath sounds.  Musculoskeletal:     Comments: Normal Muscle Bulk and Muscle Testing Reveals:  Upper Extremities: Full ROM and Muscle Strength 5/5  Lower Extremities: Right: Full ROM and Muscle Strength 5/5 Left: Decreased ROM and Muscle Strength 4/5 Wearing Left AFO Arises from Table Slowly using walker for support Narrow Based Gait   Skin:    General: Skin is warm and dry.  Neurological:     Mental Status: He is alert and oriented to person, place, and time.  Psychiatric:        Mood and  Affect: Mood normal.        Behavior: Behavior normal.           Assessment & Plan:  1.Physical disability: Continue Out patient therapy with [facility] 2. Urinary retention: Continue current medication regimen. Has a scheduled appointment with Urology.  3. Anxiety disorder due to brain injury: Continue current medication  regimen. PCP following. Continue to Monitor.  4. Controlled type 2 DM with hyperglycemia, without long-trm current use of insulin. Continue current medication regimen. PCP Following.   20 minutes of face to face patient care time was spent during this visit. All questions were encouraged and answered.  F/U with Dr. Posey Pronto in 4-6 weeks

## 2019-06-24 DIAGNOSIS — M069 Rheumatoid arthritis, unspecified: Secondary | ICD-10-CM | POA: Diagnosis not present

## 2019-06-24 DIAGNOSIS — D631 Anemia in chronic kidney disease: Secondary | ICD-10-CM | POA: Diagnosis not present

## 2019-06-24 DIAGNOSIS — I5023 Acute on chronic systolic (congestive) heart failure: Secondary | ICD-10-CM | POA: Diagnosis not present

## 2019-06-24 DIAGNOSIS — N183 Chronic kidney disease, stage 3 unspecified: Secondary | ICD-10-CM | POA: Diagnosis not present

## 2019-06-24 DIAGNOSIS — J1289 Other viral pneumonia: Secondary | ICD-10-CM | POA: Diagnosis not present

## 2019-06-24 DIAGNOSIS — E1122 Type 2 diabetes mellitus with diabetic chronic kidney disease: Secondary | ICD-10-CM | POA: Diagnosis not present

## 2019-06-24 DIAGNOSIS — U071 COVID-19: Secondary | ICD-10-CM | POA: Diagnosis not present

## 2019-06-24 DIAGNOSIS — I130 Hypertensive heart and chronic kidney disease with heart failure and stage 1 through stage 4 chronic kidney disease, or unspecified chronic kidney disease: Secondary | ICD-10-CM | POA: Diagnosis not present

## 2019-06-24 DIAGNOSIS — L89310 Pressure ulcer of right buttock, unstageable: Secondary | ICD-10-CM | POA: Diagnosis not present

## 2019-06-25 DIAGNOSIS — Z87891 Personal history of nicotine dependence: Secondary | ICD-10-CM

## 2019-06-25 DIAGNOSIS — M069 Rheumatoid arthritis, unspecified: Secondary | ICD-10-CM

## 2019-06-25 DIAGNOSIS — I2510 Atherosclerotic heart disease of native coronary artery without angina pectoris: Secondary | ICD-10-CM

## 2019-06-25 DIAGNOSIS — Z8614 Personal history of Methicillin resistant Staphylococcus aureus infection: Secondary | ICD-10-CM

## 2019-06-25 DIAGNOSIS — Z8701 Personal history of pneumonia (recurrent): Secondary | ICD-10-CM

## 2019-06-25 DIAGNOSIS — F419 Anxiety disorder, unspecified: Secondary | ICD-10-CM

## 2019-06-25 DIAGNOSIS — I872 Venous insufficiency (chronic) (peripheral): Secondary | ICD-10-CM

## 2019-06-25 DIAGNOSIS — U071 COVID-19: Secondary | ICD-10-CM

## 2019-06-25 DIAGNOSIS — R338 Other retention of urine: Secondary | ICD-10-CM

## 2019-06-25 DIAGNOSIS — E1165 Type 2 diabetes mellitus with hyperglycemia: Secondary | ICD-10-CM

## 2019-06-25 DIAGNOSIS — Z955 Presence of coronary angioplasty implant and graft: Secondary | ICD-10-CM

## 2019-06-25 DIAGNOSIS — J9601 Acute respiratory failure with hypoxia: Secondary | ICD-10-CM

## 2019-06-25 DIAGNOSIS — S069X0S Unspecified intracranial injury without loss of consciousness, sequela: Secondary | ICD-10-CM

## 2019-06-25 DIAGNOSIS — J441 Chronic obstructive pulmonary disease with (acute) exacerbation: Secondary | ICD-10-CM

## 2019-06-25 DIAGNOSIS — R29898 Other symptoms and signs involving the musculoskeletal system: Secondary | ICD-10-CM

## 2019-06-25 DIAGNOSIS — S14109S Unspecified injury at unspecified level of cervical spinal cord, sequela: Secondary | ICD-10-CM

## 2019-06-25 DIAGNOSIS — K219 Gastro-esophageal reflux disease without esophagitis: Secondary | ICD-10-CM

## 2019-06-25 DIAGNOSIS — I5023 Acute on chronic systolic (congestive) heart failure: Secondary | ICD-10-CM

## 2019-06-25 DIAGNOSIS — G4700 Insomnia, unspecified: Secondary | ICD-10-CM

## 2019-06-25 DIAGNOSIS — I480 Paroxysmal atrial fibrillation: Secondary | ICD-10-CM

## 2019-06-25 DIAGNOSIS — R1312 Dysphagia, oropharyngeal phase: Secondary | ICD-10-CM

## 2019-06-25 DIAGNOSIS — N183 Chronic kidney disease, stage 3 unspecified: Secondary | ICD-10-CM

## 2019-06-25 DIAGNOSIS — E7800 Pure hypercholesterolemia, unspecified: Secondary | ICD-10-CM

## 2019-06-25 DIAGNOSIS — D631 Anemia in chronic kidney disease: Secondary | ICD-10-CM

## 2019-06-25 DIAGNOSIS — Z9049 Acquired absence of other specified parts of digestive tract: Secondary | ICD-10-CM

## 2019-06-25 DIAGNOSIS — I130 Hypertensive heart and chronic kidney disease with heart failure and stage 1 through stage 4 chronic kidney disease, or unspecified chronic kidney disease: Secondary | ICD-10-CM

## 2019-06-25 DIAGNOSIS — E1151 Type 2 diabetes mellitus with diabetic peripheral angiopathy without gangrene: Secondary | ICD-10-CM

## 2019-06-25 DIAGNOSIS — E1122 Type 2 diabetes mellitus with diabetic chronic kidney disease: Secondary | ICD-10-CM

## 2019-06-25 DIAGNOSIS — I951 Orthostatic hypotension: Secondary | ICD-10-CM

## 2019-06-25 DIAGNOSIS — R490 Dysphonia: Secondary | ICD-10-CM

## 2019-06-25 DIAGNOSIS — J1289 Other viral pneumonia: Secondary | ICD-10-CM

## 2019-06-25 DIAGNOSIS — I255 Ischemic cardiomyopathy: Secondary | ICD-10-CM

## 2019-06-25 DIAGNOSIS — L89310 Pressure ulcer of right buttock, unstageable: Secondary | ICD-10-CM

## 2019-06-25 DIAGNOSIS — I252 Old myocardial infarction: Secondary | ICD-10-CM

## 2019-06-27 DIAGNOSIS — I5023 Acute on chronic systolic (congestive) heart failure: Secondary | ICD-10-CM | POA: Diagnosis not present

## 2019-06-27 DIAGNOSIS — J1289 Other viral pneumonia: Secondary | ICD-10-CM | POA: Diagnosis not present

## 2019-06-27 DIAGNOSIS — I130 Hypertensive heart and chronic kidney disease with heart failure and stage 1 through stage 4 chronic kidney disease, or unspecified chronic kidney disease: Secondary | ICD-10-CM | POA: Diagnosis not present

## 2019-06-27 DIAGNOSIS — N183 Chronic kidney disease, stage 3 unspecified: Secondary | ICD-10-CM | POA: Diagnosis not present

## 2019-06-27 DIAGNOSIS — D631 Anemia in chronic kidney disease: Secondary | ICD-10-CM | POA: Diagnosis not present

## 2019-06-27 DIAGNOSIS — E1122 Type 2 diabetes mellitus with diabetic chronic kidney disease: Secondary | ICD-10-CM | POA: Diagnosis not present

## 2019-06-27 DIAGNOSIS — M069 Rheumatoid arthritis, unspecified: Secondary | ICD-10-CM | POA: Diagnosis not present

## 2019-06-27 DIAGNOSIS — U071 COVID-19: Secondary | ICD-10-CM | POA: Diagnosis not present

## 2019-06-27 DIAGNOSIS — L89310 Pressure ulcer of right buttock, unstageable: Secondary | ICD-10-CM | POA: Diagnosis not present

## 2019-07-01 DIAGNOSIS — U071 COVID-19: Secondary | ICD-10-CM | POA: Diagnosis not present

## 2019-07-01 DIAGNOSIS — I5023 Acute on chronic systolic (congestive) heart failure: Secondary | ICD-10-CM | POA: Diagnosis not present

## 2019-07-01 DIAGNOSIS — L89310 Pressure ulcer of right buttock, unstageable: Secondary | ICD-10-CM | POA: Diagnosis not present

## 2019-07-01 DIAGNOSIS — D631 Anemia in chronic kidney disease: Secondary | ICD-10-CM | POA: Diagnosis not present

## 2019-07-01 DIAGNOSIS — N183 Chronic kidney disease, stage 3 unspecified: Secondary | ICD-10-CM | POA: Diagnosis not present

## 2019-07-01 DIAGNOSIS — I130 Hypertensive heart and chronic kidney disease with heart failure and stage 1 through stage 4 chronic kidney disease, or unspecified chronic kidney disease: Secondary | ICD-10-CM | POA: Diagnosis not present

## 2019-07-01 DIAGNOSIS — J1289 Other viral pneumonia: Secondary | ICD-10-CM | POA: Diagnosis not present

## 2019-07-01 DIAGNOSIS — E1122 Type 2 diabetes mellitus with diabetic chronic kidney disease: Secondary | ICD-10-CM | POA: Diagnosis not present

## 2019-07-01 DIAGNOSIS — M069 Rheumatoid arthritis, unspecified: Secondary | ICD-10-CM | POA: Diagnosis not present

## 2019-07-03 ENCOUNTER — Other Ambulatory Visit: Payer: Self-pay

## 2019-07-03 ENCOUNTER — Inpatient Hospital Stay (HOSPITAL_COMMUNITY)
Admission: EM | Admit: 2019-07-03 | Discharge: 2019-07-07 | DRG: 177 | Disposition: A | Discharge status: Home Health | Payer: Medicare HMO | Attending: Internal Medicine | Admitting: Internal Medicine

## 2019-07-03 ENCOUNTER — Emergency Department (HOSPITAL_COMMUNITY): Payer: Medicare HMO

## 2019-07-03 ENCOUNTER — Encounter (HOSPITAL_COMMUNITY): Payer: Self-pay

## 2019-07-03 DIAGNOSIS — Z7951 Long term (current) use of inhaled steroids: Secondary | ICD-10-CM | POA: Diagnosis not present

## 2019-07-03 DIAGNOSIS — I255 Ischemic cardiomyopathy: Secondary | ICD-10-CM | POA: Diagnosis present

## 2019-07-03 DIAGNOSIS — N182 Chronic kidney disease, stage 2 (mild): Secondary | ICD-10-CM | POA: Diagnosis present

## 2019-07-03 DIAGNOSIS — R0789 Other chest pain: Secondary | ICD-10-CM | POA: Diagnosis not present

## 2019-07-03 DIAGNOSIS — I5043 Acute on chronic combined systolic (congestive) and diastolic (congestive) heart failure: Secondary | ICD-10-CM | POA: Diagnosis not present

## 2019-07-03 DIAGNOSIS — I252 Old myocardial infarction: Secondary | ICD-10-CM

## 2019-07-03 DIAGNOSIS — IMO0002 Reserved for concepts with insufficient information to code with codable children: Secondary | ICD-10-CM | POA: Diagnosis present

## 2019-07-03 DIAGNOSIS — R1310 Dysphagia, unspecified: Secondary | ICD-10-CM | POA: Diagnosis not present

## 2019-07-03 DIAGNOSIS — I480 Paroxysmal atrial fibrillation: Secondary | ICD-10-CM | POA: Diagnosis present

## 2019-07-03 DIAGNOSIS — I10 Essential (primary) hypertension: Secondary | ICD-10-CM | POA: Diagnosis not present

## 2019-07-03 DIAGNOSIS — J449 Chronic obstructive pulmonary disease, unspecified: Secondary | ICD-10-CM | POA: Diagnosis present

## 2019-07-03 DIAGNOSIS — F064 Anxiety disorder due to known physiological condition: Secondary | ICD-10-CM | POA: Diagnosis not present

## 2019-07-03 DIAGNOSIS — D649 Anemia, unspecified: Secondary | ICD-10-CM | POA: Diagnosis present

## 2019-07-03 DIAGNOSIS — Z800 Family history of malignant neoplasm of digestive organs: Secondary | ICD-10-CM | POA: Diagnosis not present

## 2019-07-03 DIAGNOSIS — I2510 Atherosclerotic heart disease of native coronary artery without angina pectoris: Secondary | ICD-10-CM | POA: Diagnosis not present

## 2019-07-03 DIAGNOSIS — Z794 Long term (current) use of insulin: Secondary | ICD-10-CM | POA: Diagnosis not present

## 2019-07-03 DIAGNOSIS — I4891 Unspecified atrial fibrillation: Secondary | ICD-10-CM | POA: Diagnosis not present

## 2019-07-03 DIAGNOSIS — F17210 Nicotine dependence, cigarettes, uncomplicated: Secondary | ICD-10-CM | POA: Diagnosis present

## 2019-07-03 DIAGNOSIS — I5023 Acute on chronic systolic (congestive) heart failure: Secondary | ICD-10-CM | POA: Diagnosis not present

## 2019-07-03 DIAGNOSIS — Z7901 Long term (current) use of anticoagulants: Secondary | ICD-10-CM

## 2019-07-03 DIAGNOSIS — I130 Hypertensive heart and chronic kidney disease with heart failure and stage 1 through stage 4 chronic kidney disease, or unspecified chronic kidney disease: Secondary | ICD-10-CM | POA: Diagnosis present

## 2019-07-03 DIAGNOSIS — T17920A Food in respiratory tract, part unspecified causing asphyxiation, initial encounter: Secondary | ICD-10-CM | POA: Diagnosis not present

## 2019-07-03 DIAGNOSIS — I5042 Chronic combined systolic (congestive) and diastolic (congestive) heart failure: Secondary | ICD-10-CM | POA: Diagnosis present

## 2019-07-03 DIAGNOSIS — J690 Pneumonitis due to inhalation of food and vomit: Secondary | ICD-10-CM | POA: Diagnosis not present

## 2019-07-03 DIAGNOSIS — Z20828 Contact with and (suspected) exposure to other viral communicable diseases: Secondary | ICD-10-CM | POA: Diagnosis not present

## 2019-07-03 DIAGNOSIS — D631 Anemia in chronic kidney disease: Secondary | ICD-10-CM | POA: Diagnosis present

## 2019-07-03 DIAGNOSIS — E7800 Pure hypercholesterolemia, unspecified: Secondary | ICD-10-CM | POA: Diagnosis present

## 2019-07-03 DIAGNOSIS — E119 Type 2 diabetes mellitus without complications: Secondary | ICD-10-CM

## 2019-07-03 DIAGNOSIS — Z7902 Long term (current) use of antithrombotics/antiplatelets: Secondary | ICD-10-CM

## 2019-07-03 DIAGNOSIS — J1289 Other viral pneumonia: Secondary | ICD-10-CM | POA: Diagnosis not present

## 2019-07-03 DIAGNOSIS — Z8249 Family history of ischemic heart disease and other diseases of the circulatory system: Secondary | ICD-10-CM

## 2019-07-03 DIAGNOSIS — Z825 Family history of asthma and other chronic lower respiratory diseases: Secondary | ICD-10-CM

## 2019-07-03 DIAGNOSIS — E1122 Type 2 diabetes mellitus with diabetic chronic kidney disease: Secondary | ICD-10-CM | POA: Diagnosis present

## 2019-07-03 DIAGNOSIS — N183 Chronic kidney disease, stage 3 unspecified: Secondary | ICD-10-CM | POA: Diagnosis not present

## 2019-07-03 DIAGNOSIS — Z801 Family history of malignant neoplasm of trachea, bronchus and lung: Secondary | ICD-10-CM

## 2019-07-03 DIAGNOSIS — K219 Gastro-esophageal reflux disease without esophagitis: Secondary | ICD-10-CM | POA: Diagnosis not present

## 2019-07-03 DIAGNOSIS — Z8619 Personal history of other infectious and parasitic diseases: Secondary | ICD-10-CM | POA: Diagnosis not present

## 2019-07-03 DIAGNOSIS — J9601 Acute respiratory failure with hypoxia: Secondary | ICD-10-CM | POA: Diagnosis present

## 2019-07-03 DIAGNOSIS — J441 Chronic obstructive pulmonary disease with (acute) exacerbation: Secondary | ICD-10-CM | POA: Diagnosis present

## 2019-07-03 DIAGNOSIS — E785 Hyperlipidemia, unspecified: Secondary | ICD-10-CM | POA: Diagnosis present

## 2019-07-03 DIAGNOSIS — M069 Rheumatoid arthritis, unspecified: Secondary | ICD-10-CM | POA: Diagnosis not present

## 2019-07-03 DIAGNOSIS — U071 COVID-19: Secondary | ICD-10-CM | POA: Diagnosis not present

## 2019-07-03 DIAGNOSIS — Z91041 Radiographic dye allergy status: Secondary | ICD-10-CM

## 2019-07-03 DIAGNOSIS — R05 Cough: Secondary | ICD-10-CM | POA: Diagnosis not present

## 2019-07-03 DIAGNOSIS — R0602 Shortness of breath: Secondary | ICD-10-CM | POA: Diagnosis not present

## 2019-07-03 DIAGNOSIS — L89302 Pressure ulcer of unspecified buttock, stage 2: Secondary | ICD-10-CM | POA: Diagnosis not present

## 2019-07-03 DIAGNOSIS — R918 Other nonspecific abnormal finding of lung field: Secondary | ICD-10-CM | POA: Diagnosis not present

## 2019-07-03 DIAGNOSIS — L89310 Pressure ulcer of right buttock, unstageable: Secondary | ICD-10-CM | POA: Diagnosis not present

## 2019-07-03 DIAGNOSIS — E1165 Type 2 diabetes mellitus with hyperglycemia: Secondary | ICD-10-CM | POA: Diagnosis not present

## 2019-07-03 HISTORY — DX: COVID-19: U07.1

## 2019-07-03 HISTORY — DX: Systemic involvement of connective tissue, unspecified: M35.9

## 2019-07-03 LAB — CBC WITH DIFFERENTIAL/PLATELET
Abs Immature Granulocytes: 0.02 10*3/uL (ref 0.00–0.07)
Basophils Absolute: 0.1 10*3/uL (ref 0.0–0.1)
Basophils Relative: 1 %
Eosinophils Absolute: 0.2 10*3/uL (ref 0.0–0.5)
Eosinophils Relative: 4 %
HCT: 31.7 % — ABNORMAL LOW (ref 39.0–52.0)
Hemoglobin: 9.8 g/dL — ABNORMAL LOW (ref 13.0–17.0)
Immature Granulocytes: 0 %
Lymphocytes Relative: 18 %
Lymphs Abs: 0.9 10*3/uL (ref 0.7–4.0)
MCH: 28.5 pg (ref 26.0–34.0)
MCHC: 30.9 g/dL (ref 30.0–36.0)
MCV: 92.2 fL (ref 80.0–100.0)
Monocytes Absolute: 0.4 10*3/uL (ref 0.1–1.0)
Monocytes Relative: 7 %
Neutro Abs: 3.7 10*3/uL (ref 1.7–7.7)
Neutrophils Relative %: 70 %
Platelets: 183 10*3/uL (ref 150–400)
RBC: 3.44 MIL/uL — ABNORMAL LOW (ref 4.22–5.81)
RDW: 15 % (ref 11.5–15.5)
WBC: 5.2 10*3/uL (ref 4.0–10.5)
nRBC: 0 % (ref 0.0–0.2)

## 2019-07-03 LAB — COMPREHENSIVE METABOLIC PANEL
ALT: 27 U/L (ref 0–44)
AST: 21 U/L (ref 15–41)
Albumin: 3.1 g/dL — ABNORMAL LOW (ref 3.5–5.0)
Alkaline Phosphatase: 91 U/L (ref 38–126)
Anion gap: 7 (ref 5–15)
BUN: 18 mg/dL (ref 8–23)
CO2: 23 mmol/L (ref 22–32)
Calcium: 8.8 mg/dL — ABNORMAL LOW (ref 8.9–10.3)
Chloride: 106 mmol/L (ref 98–111)
Creatinine, Ser: 1.13 mg/dL (ref 0.61–1.24)
GFR calc Af Amer: >60 mL/min (ref >60)
GFR calc non Af Amer: >60 mL/min (ref >60)
Glucose, Bld: 143 mg/dL — ABNORMAL HIGH (ref 70–99)
Potassium: 4.1 mmol/L (ref 3.5–5.1)
Sodium: 136 mmol/L (ref 135–145)
Total Bilirubin: 0.7 mg/dL (ref 0.3–1.2)
Total Protein: 7.7 g/dL (ref 6.5–8.1)

## 2019-07-03 LAB — LACTIC ACID, PLASMA: Lactic Acid, Venous: 0.7 mmol/L (ref 0.5–1.9)

## 2019-07-03 NOTE — ED Provider Notes (Signed)
[facility] EMERGENCY DEPARTMENT Provider Note   CSN: [id number] Arrival date & time: 07/03/19  2227     History   Chief Complaint Chief Complaint  Patient presents with   Choked    HPI Robert [name] is a 68 y.o. male.     Patient went home with episode of "choking and grits.  ".  States he was feeling fine and he was eating some grits this evening.  He then "got choked" a couple episodes of spitting up and coughing up phlegm.  He states he now feels better.  This lasted for several minutes.  He feels like he is breathing comfortably at this time.  He denies having any chest pain but states he did have some burning after the episode of grits and took some Rolaids at home.  Parents have a history of COPD with recent prolonged hospitalization for respiratory failure in the setting of COVID pneumonia and respiratory failure.  He states he had multiple swallow evaluations while he was in the hospital and was told he could eat anything that "was not crunchy".  States his leg swelling is at baseline.  Denies any difficulty breathing now.  Denies any fever or cough.    patient admitted with Acute hypoxic respiratory failure due to multilobar viral pneumonia, [id number], complicated by MRSA pneumonia and COPD exacerbation.  He was discharged from rehab on September 25.  He was hospitalized for total of 58 days.  The history is provided by the patient and the EMS personnel.    Past Medical History:  Diagnosis Date   Anxiety    COPD (chronic obstructive pulmonary disease) (HCC)    Coronary artery disease    COVID-19    Full dentures    GERD (gastroesophageal reflux disease)    Rolaids as needed   High cholesterol    History of MI (myocardial infarction)    Hypertension    med. dosage increased 04/2016; has been on BP med. x 10 yrs.   Incarcerated umbilical hernia 0000000   Inguinal hernia 05/2016   bilateral    Insulin dependent diabetes mellitus    Ischemic  cardiomyopathy    a. 03/2015 EF 40-45% by LV gram.   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    Hypokalemia    Physical debility 05/22/2019   Demand ischemia ([place]) 05/20/2019   Flash pulmonary edema ([place]) 05/20/2019   Stage II decubitus ulcer ([place]) 05/18/2019   Aspiration pneumonia of both lower lobes due to gastric secretions ([place]) 05/15/2019   MRSA pneumonia ([place]) 05/15/2019   Severe sepsis with septic shock ([place]) 05/15/2019   NSTEMI (non-ST elevated myocardial infarction) ([place]) 05/15/2019   Staphylococcal pneumonia ([place]) 04/20/2019   HCAP (healthcare-associated pneumonia) 04/20/2019   Pneumonia due to COVID-19 virus 04/18/2019   COPD (chronic obstructive pulmonary disease) with emphysema ([place]) 04/18/2019   AKI (acute kidney injury) ([place]) 04/18/2019   Diabetes mellitus type 2, uncontrolled, with complications ([place]) [id number]   Pressure injury of skin 04/18/2019   Atrial fibrillation with RVR ([place]) 04/15/2019   COVID-19 virus detected 04/12/2019   Acute respiratory distress syndrome (ARDS) due to COVID-19 virus ([place]) 04/12/2019   Hypoxia    Sepsis (  [place])    PAD (peripheral artery disease) ([place]) 03/27/2019   Pulmonary edema 08/27/2018   Acute respiratory failure with hypoxemia (HCC) 08/27/2018   Chronic combined systolic and diastolic heart failure ([place]) 08/27/2018   Dyslipidemia (high LDL; low HDL) 03/29/2018   Dysphagia 01/02/2018   Diarrhea 01/02/2018   Family history of colon cancer 01/02/2018   Ischemic cardiomyopathy    Insulin dependent diabetes mellitus    Hypertension    History of MI (myocardial infarction)    GERD (gastroesophageal reflux  disease)    Full dentures    Coronary artery disease    Anxiety    Acute ST elevation myocardial infarction (STEMI) of inferior wall ([place]) 06/06/2017   Varicose veins of left lower extremity with complications [id number]   Cellulitis of left leg without foot    Right radial fracture 12/11/2016   T3 vertebral fracture ([place]) 12/11/2016   Fall 12/09/2016   Thrombocytopenia ([place]) 10/09/2016   Left leg cellulitis 10/08/2016   Diabetes ([place]) 10/08/2016   Inguinal hernia 05/12/2016   Incarcerated umbilical hernia [id number]   Chronic combined systolic and diastolic CHF (congestive heart failure) ([place]) 04/25/2016   High cholesterol    Controlled type 2 diabetes mellitus with diabetic peripheral angiopathy without gangrene, with long-term current use of insulin (HCC)    Essential hypertension    Non-STEMI (non-ST elevated myocardial infarction) (HCC)    CAD S/P percutaneous coronary angioplasty    Unstable angina ([place]) 03/20/2015   ICM-EF 35% at cath 02/01/15 02/02/2015   DM type 2 causing vascular disease, not at goal [facility]) 05/29/2014   Dyspnea 10/20/2013   COPD (chronic obstructive pulmonary disease) ([place]) 09/03/2013   Hyperlipidemia    Old myocardial infarction 09/01/2013   Abnormal nuclear stress test 05/17/2012   S/P CFX PTCAI for ISR 02/01/15 05/17/2012   PVD, chronic LLE 12/22/2011   Rheumatoid arthritis ([place]) 12/22/2011   Contrast media allergy 12/22/2011   Tobacco abuse 12/21/2011    Past Surgical History:  Procedure Laterality Date   CARDIAC CATHETERIZATION N/A 02/01/2015   Procedure: Left Heart Cath and Coronary Angiography;  Surgeon: Belva Crome, MD;  Location: [place] CV LAB;  Service: Cardiovascular;  Laterality: N/A;   CARDIAC CATHETERIZATION N/A 03/22/2015   Procedure: Left Heart Cath and Coronary Angiography;  Surgeon: Leonie Man, MD;  Location: [place] CV LAB;  Service: Cardiovascular;  Laterality: N/A;   CARDIAC  CATHETERIZATION  09/05/2002; 03/09/2003; 04/10/2005;06/02/2005; 05/09/2006; 02/09/2007; 03/01/2007   COLONOSCOPY  2008   Dr. Gala Romney: diverticulosis, hyperplastic polyp.   CORONARY ANGIOPLASTY  11/14/2012; 02/01/2015   CORONARY ANGIOPLASTY WITH STENT PLACEMENT  05/16/2012   "1; makes total ~ 4"   CORONARY STENT INTERVENTION N/A 06/06/2017   Procedure: CORONARY STENT INTERVENTION;  Surgeon: Belva Crome, MD;  Location: [place] CV LAB;  Service: Cardiovascular;  Laterality: N/A;   INSERTION OF MESH Bilateral 05/24/2016   Procedure: INSERTION OF MESH;  Surgeon: Coralie Keens, MD;  Location: [place];  Service: General;  Laterality: Bilateral;   LAPAROSCOPIC CHOLECYSTECTOMY     LAPAROSCOPIC INGUINAL HERNIA WITH UMBILICAL HERNIA Bilateral 05/24/2016   Procedure: BILATERAL LAPAROSCOPIC INGUINAL HERNIA REPAIR WITH MESH AND UMBILICAL HERNIA REPAIR WITH MESH;  Surgeon: Coralie Keens, MD;  Location: [place];  Service: General;  Laterality: Bilateral;   LEFT HEART CATH AND CORONARY ANGIOGRAPHY N/A 06/06/2017   Procedure: LEFT HEART CATH AND CORONARY ANGIOGRAPHY;  Surgeon: Belva Crome, MD;  Location: [place] CV LAB;  Service: Cardiovascular;  Laterality: N/A;   LEFT HEART CATHETERIZATION WITH CORONARY ANGIOGRAM N/A 12/22/2011   Procedure: LEFT HEART CATHETERIZATION WITH CORONARY ANGIOGRAM;  Surgeon: Troy Sine, MD;  Location: [facility] CATH LAB;  Service: Cardiovascular;  Laterality: N/A;   LEFT HEART CATHETERIZATION WITH CORONARY ANGIOGRAM Bilateral 05/16/2012   Procedure: LEFT HEART CATHETERIZATION WITH CORONARY ANGIOGRAM;  Surgeon: Lorretta Harp, MD;  Location: [facility] CATH LAB;  Service: Cardiovascular;  Laterality: Bilateral;   LEFT HEART CATHETERIZATION WITH CORONARY ANGIOGRAM N/A 11/14/2012   Procedure: LEFT HEART CATHETERIZATION WITH CORONARY ANGIOGRAM;  Surgeon: Troy Sine, MD;  Location: [facility] CATH LAB;  Service: Cardiovascular;  Laterality: N/A;   LEFT  HEART CATHETERIZATION WITH CORONARY ANGIOGRAM N/A 09/01/2013   Procedure: LEFT HEART CATHETERIZATION WITH CORONARY ANGIOGRAM;  Surgeon: Leonie Man, MD;  Location: [facility] CATH LAB;  Service: Cardiovascular;  Laterality: N/A;   LUMBAR LAMINECTOMY  1972; 1973; 1985   x [place]  08/30/2009   No significant ischemia   OPEN REDUCTION INTERNAL FIXATION (ORIF) DISTAL RADIAL FRACTURE Right 12/10/2016   Procedure: OPEN REDUCTION INTERNAL FIXATION (ORIF) DISTAL RADIAL FRACTURE;  Surgeon: Iran Planas, MD;  Location: [place];  Service: Orthopedics;  Laterality: Right;   PERCUTANEOUS CORONARY INTERVENTION-BALLOON ONLY  12/22/2011   Procedure: PERCUTANEOUS CORONARY INTERVENTION-BALLOON ONLY;  Surgeon: Troy Sine, MD;  Location: [facility] CATH LAB;  Service: Cardiovascular;;   PERCUTANEOUS CORONARY INTERVENTION-BALLOON ONLY  11/14/2012   Procedure: PERCUTANEOUS CORONARY INTERVENTION-BALLOON ONLY;  Surgeon: Troy Sine, MD;  Location: [facility] CATH LAB;  Service: Cardiovascular;;   PERCUTANEOUS CORONARY STENT INTERVENTION (PCI-S)  05/16/2012   Procedure: PERCUTANEOUS CORONARY STENT INTERVENTION (PCI-S);  Surgeon: Lorretta Harp, MD;  Location: [facility] CATH LAB;  Service: Cardiovascular;;   PERCUTANEOUS CORONARY STENT INTERVENTION (PCI-S)  09/01/2013   Procedure: PERCUTANEOUS CORONARY STENT INTERVENTION (PCI-S);  Surgeon: Leonie Man, MD;  Location: [facility] CATH LAB;  Service: Cardiovascular;;        Home Medications    Prior to Admission medications   Medication Sig Start Date End Date Taking? Authorizing Provider  acarbose (PRECOSE) 100 MG tablet Take 1 tablet (100 mg total) by mouth 3 (three) times daily with meals. 06/06/19   Love, Ivan Anchors, PA-C  acetaminophen (TYLENOL) 650 MG CR tablet Take 650 mg by mouth every 8 (eight) hours as needed for pain.    [provider]  ALPRAZolam Duanne Moron) 0.5 MG tablet Take 1 tablet (0.5 mg total) by mouth 3 (three) times daily as needed for anxiety  or sleep. 06/06/19   Love, Ivan Anchors, PA-C  apixaban (ELIQUIS) 5 MG TABS tablet Take 1 tablet (5 mg total) by mouth 2 (two) times daily. 06/06/19 07/06/19  Love, Ivan Anchors, PA-C  atorvastatin (LIPITOR) 80 MG tablet Take 1 tablet (80 mg total) by mouth daily at 6 PM. 06/06/19   Love, Ivan Anchors, PA-C  clopidogrel (PLAVIX) 75 MG tablet TAKE (1) TABLET BY MOUTH ONCE DAILY. 06/06/19   Love, Ivan Anchors, PA-C  collagenase (SANTYL) ointment Apply topically daily. And cover with damp to dry dressing daily. Change more frequently if soiled 06/06/19   Love, Ivan Anchors, PA-C  fish oil-omega-3 fatty acids 1000 MG capsule Take 1 g by mouth daily.     [provider]  folic acid (FOLVITE) 1 MG tablet Take 1 tablet (1 mg total) by mouth daily. 06/06/19   Love, Ivan Anchors, PA-C  furosemide (LASIX) 40 MG tablet Take 1 tablet (40 mg total) by mouth daily.  06/06/19 07/06/19  Love, Ivan Anchors, PA-C  gabapentin (NEURONTIN) 100 MG capsule Take 2 capsules (200 mg total) by mouth every 8 (eight) hours. 06/06/19   Love, Ivan Anchors, PA-C  glimepiride (AMARYL) 2 MG tablet Take 1 tablet (2 mg total) by mouth daily with breakfast. 06/06/19   Love, Ivan Anchors, PA-C  metoprolol tartrate (LOPRESSOR) 50 MG tablet Take 1 tablet (50 mg total) by mouth 2 (two) times daily. 06/06/19 07/06/19  Love, Ivan Anchors, PA-C  mometasone-formoterol (DULERA) 200-5 MCG/ACT AERO Inhale 2 puffs into the lungs 2 (two) times daily. 06/06/19   Love, Ivan Anchors, PA-C  Multiple Vitamin (MULTIVITAMIN WITH MINERALS) TABS tablet Take 1 tablet by mouth daily. 06/06/19   Love, Ivan Anchors, PA-C  nitroGLYCERIN (NITROSTAT) 0.4 MG SL tablet Place 1 tablet (0.4 mg total) under the tongue every 5 (five) minutes x 3 doses as needed for chest pain. 04/24/16   Croitoru, Mihai, MD  nystatin (MYCOSTATIN/NYSTOP) powder Apply topically 3 (three) times daily. 06/06/19   Love, Ivan Anchors, PA-C  potassium chloride SA (K-DUR) 20 MEQ tablet Take 1 tablet (20 mEq total) by mouth 2 (two) times daily. 06/06/19    Love, Ivan Anchors, PA-C  tamsulosin (FLOMAX) 0.4 MG CAPS capsule Take 2 capsules (0.8 mg total) by mouth daily after supper. 06/06/19 07/06/19  Bary Leriche, PA-C  vitamin C (VITAMIN C) 250 MG tablet Take 1 tablet (250 mg total) by mouth 2 (two) times daily. 06/06/19   Bary Leriche, PA-C    Family History Family History  Problem Relation Age of Onset   Heart disease Father    Colon cancer Brother        early 61s   Hypertension Mother    Heart disease Mother    Colon cancer Mother        died age 74. late 93s early 7s at diagnosis.    Lung cancer Mother     Social History Social History   Tobacco Use   Smoking status: Current Some Day Smoker    Packs/day: 1.00    Types: Cigarettes    Last attempt to quit: 06/11/2015    Years since quitting: 4.0   Smokeless tobacco: Never Used   Tobacco comment: pt states smoking less than half a pack  Substance Use Topics   Alcohol use: No   Drug use: No     Allergies   Ivp dye [iodinated diagnostic agents]   Review of Systems Review of Systems  Constitutional: Negative for activity change, appetite change and fever.  Eyes: Negative for visual disturbance.  Respiratory: Positive for cough, choking and chest tightness.   Cardiovascular: Negative for chest pain.  Gastrointestinal: Negative for abdominal pain, nausea and vomiting.  Genitourinary: Negative for dysuria and hematuria.  Musculoskeletal: Negative for arthralgias and myalgias.  Skin: Negative for rash.  Neurological: Negative for dizziness, weakness and headaches.   all other systems are negative except as noted in the HPI and PMH.     Physical Exam Updated Vital Signs BP (!) 143/84 (BP Location: Left Arm)    Pulse 87    Temp 98.8 F (37.1 C) (Oral)    Resp 18    Ht 6' (1.829 m)    Wt 99.8 kg    SpO2 94%    BMI 29.84 kg/m   Physical Exam Vitals signs and nursing note reviewed.  Constitutional:      General: He is not in acute distress.    Appearance:  He is well-developed. He  is ill-appearing.     Comments: No increased work of breathing, speaking full sentences, chronically ill-appearing  HENT:     Head: Normocephalic and atraumatic.     Mouth/Throat:     Pharynx: No oropharyngeal exudate.  Eyes:     Conjunctiva/sclera: Conjunctivae normal.     Pupils: Pupils are equal, round, and reactive to light.  Neck:     Musculoskeletal: Normal range of motion and neck supple.     Comments: No meningismus. Cardiovascular:     Rate and Rhythm: Normal rate and regular rhythm.     Heart sounds: Normal heart sounds. No murmur.  Pulmonary:     Effort: Pulmonary effort is normal. No respiratory distress.     Breath sounds: Normal breath sounds.     Comments: Diminished breath sounds bilaterally Chest:     Chest wall: No tenderness.  Abdominal:     Palpations: Abdomen is soft.     Tenderness: There is no abdominal tenderness. There is no guarding or rebound.  Musculoskeletal: Normal range of motion.        General: No tenderness.     Right lower leg: Edema present.     Left lower leg: Edema present.     Comments: +1 edema bilaterally  Skin:    General: Skin is warm.     Capillary Refill: Capillary refill takes less than 2 seconds.  Neurological:     Mental Status: He is alert and oriented to person, place, and time.     Cranial Nerves: No cranial nerve deficit.     Motor: No abnormal muscle tone.     Coordination: Coordination normal.     Comments: No ataxia on finger to nose bilaterally. No pronator drift. 5/5 strength throughout. CN 2-12 intact.Equal grip strength. Sensation intact.   Psychiatric:        Behavior: Behavior normal.      ED Treatments / Results  Labs (all labs ordered are listed, but only abnormal results are displayed) Labs Reviewed  CBC WITH DIFFERENTIAL/PLATELET - Abnormal; Notable for the following components:      Result Value   RBC 3.44 (*)    Hemoglobin 9.8 (*)    HCT 31.7 (*)    All other components  within normal limits  COMPREHENSIVE METABOLIC PANEL - Abnormal; Notable for the following components:   Glucose, Bld 143 (*)    Calcium 8.8 (*)    Albumin 3.1 (*)    All other components within normal limits  BRAIN NATRIURETIC PEPTIDE - Abnormal; Notable for the following components:   B Natriuretic Peptide 346.0 (*)    All other components within normal limits  D-DIMER, QUANTITATIVE (NOT AT [facility]) - Abnormal; Notable for the following components:   D-Dimer, Quant 3.29 (*)    All other components within normal limits  SARS CORONAVIRUS 2 (TAT 6-24 HRS)  CULTURE, BLOOD (ROUTINE X 2)  CULTURE, BLOOD (ROUTINE X 2)  LACTIC ACID, PLASMA  TROPONIN I (HIGH SENSITIVITY)  TROPONIN I (HIGH SENSITIVITY)    EKG EKG Interpretation  Date/Time:  Thursday July 03 2019 23:27:58 EDT Ventricular Rate:  85 PR Interval:    QRS Duration: 103 QT Interval:  374 QTC Calculation: 445 R Axis:   -14 Text Interpretation:  Sinus rhythm Paired ventricular premature complexes Interpretation limited secondary to artifact No significant change was found Confirmed by Ezequiel Essex [id number]) on 07/03/2019 11:51:11 PM   Radiology Dg Chest 2 View  Result Date: 07/03/2019 CLINICAL DATA:  Choked on grits. EXAM:  CHEST - 2 VIEW COMPARISON:  Chest radiograph 05/23/2019 CT 12/09/2016 FINDINGS: Chronic hyperinflation and emphysema. Improvement in bibasilar airspace disease from prior exam. Improvement in interstitial opacities, residual increase interstitial opacities and Kerley B-lines. Blunting of the left costophrenic angle most consistent with scarring. Unchanged heart size and mediastinal contours. Coronary stent is visualized. No pneumothorax. IMPRESSION: 1. Improving bibasilar airspace disease from prior exam. 2. Improving interstitial opacities with Kerley B-lines consistent with pulmonary edema, however improved from September radiographs. 3. Blunting of the left costophrenic angle suggests scarring and left  pleural thickening/small effusion. Electronically Signed   By: Keith Rake M.D.   On: 07/03/2019 23:19    Procedures Procedures (including critical care time)  Medications Ordered in ED Medications - No data to display   Initial Impression / Assessment and Plan / ED Course  I have reviewed the triage vital signs and the nursing notes.  Pertinent labs & imaging results that were available during my care of the patient were reviewed by me and considered in my medical decision making (see chart for details).       Patient with episode of choking.  Now improved.  No hypoxia.  Lungs are clear.  Chest x-ray shows improving pulmonary edema and airspace opacities.  Per last Speech therapy note, patient is to follow "Dys. 3 textures with thin liquids with minimal overt s/s of aspiration and overall Mod I for use of swallowing compensatory strategies. "  Attempted ambulation, patient desaturated to mid 70s.  He was placed on supplemental oxygen.  He reports increasing shortness of breath.  He is not wheezing and has diminished breath sounds.  He is given a dose of IV Lasix.  Concern for aspiration. D-dimer will be sent though patient does have a contrast allergy.  D-dimer is elevated.  Patient does have an IV contrast allergy.  Will order VQ scan for the morning.  He is in no respiratory distress on 2 L nasal cannula.  Denies chest pain.  X-ray shows improvement of previous airspace opacities.  Still have clinical concern for aspiration and is covered with IV Unasyn.  Will need overnight observation due to oxygen requirement.  Discussed with Dr. Olevia Bowens.  Robert [name] was evaluated in Emergency Department on 07/04/2019 for the symptoms described in the history of present illness. He was evaluated in the context of the global COVID-19 pandemic, which necessitated consideration that the patient might be at risk for infection with the SARS-CoV-2 virus that causes COVID-19. Institutional  protocols and algorithms that pertain to the evaluation of patients at risk for COVID-19 are in a state of rapid change based on information released by regulatory bodies including the CDC and federal and state organizations. These policies and algorithms were followed during the patient's care in the ED.  CRITICAL CARE Performed by: Ezequiel Essex Total critical care time: 35 minutes Critical care time was exclusive of separately billable procedures and treating other patients. Critical care was necessary to treat or prevent imminent or life-threatening deterioration. Critical care was time spent personally by me on the following activities: development of treatment plan with patient and/or surrogate as well as nursing, discussions with consultants, evaluation of patient's response to treatment, examination of patient, obtaining history from patient or surrogate, ordering and performing treatments and interventions, ordering and review of laboratory studies, ordering and review of radiographic studies, pulse oximetry and re-evaluation of patient's condition.   Final Clinical Impressions(s) / ED Diagnoses   Final diagnoses:  Acute respiratory failure with hypoxia (  [facility])    ED Discharge Orders    None       Laycee Fitzsimmons, Annie Main, MD 07/04/19 0201

## 2019-07-03 NOTE — ED Triage Notes (Signed)
Pt brought to ED via [place] after choking on grits at 1800. Pt with cough since choking. Pt denies SOB.

## 2019-07-03 NOTE — ED Notes (Signed)
Patient transported to X-ray 

## 2019-07-04 ENCOUNTER — Encounter (HOSPITAL_COMMUNITY): Payer: Self-pay | Admitting: Internal Medicine

## 2019-07-04 ENCOUNTER — Inpatient Hospital Stay (HOSPITAL_COMMUNITY): Payer: Medicare HMO

## 2019-07-04 DIAGNOSIS — Z8619 Personal history of other infectious and parasitic diseases: Secondary | ICD-10-CM | POA: Diagnosis not present

## 2019-07-04 DIAGNOSIS — Z825 Family history of asthma and other chronic lower respiratory diseases: Secondary | ICD-10-CM | POA: Diagnosis not present

## 2019-07-04 DIAGNOSIS — I130 Hypertensive heart and chronic kidney disease with heart failure and stage 1 through stage 4 chronic kidney disease, or unspecified chronic kidney disease: Secondary | ICD-10-CM | POA: Diagnosis present

## 2019-07-04 DIAGNOSIS — Z7902 Long term (current) use of antithrombotics/antiplatelets: Secondary | ICD-10-CM | POA: Diagnosis not present

## 2019-07-04 DIAGNOSIS — J9601 Acute respiratory failure with hypoxia: Secondary | ICD-10-CM | POA: Insufficient documentation

## 2019-07-04 DIAGNOSIS — F064 Anxiety disorder due to known physiological condition: Secondary | ICD-10-CM | POA: Diagnosis present

## 2019-07-04 DIAGNOSIS — D649 Anemia, unspecified: Secondary | ICD-10-CM | POA: Diagnosis present

## 2019-07-04 DIAGNOSIS — E785 Hyperlipidemia, unspecified: Secondary | ICD-10-CM | POA: Diagnosis present

## 2019-07-04 DIAGNOSIS — I252 Old myocardial infarction: Secondary | ICD-10-CM | POA: Diagnosis not present

## 2019-07-04 DIAGNOSIS — Z800 Family history of malignant neoplasm of digestive organs: Secondary | ICD-10-CM | POA: Diagnosis not present

## 2019-07-04 DIAGNOSIS — Z8249 Family history of ischemic heart disease and other diseases of the circulatory system: Secondary | ICD-10-CM | POA: Diagnosis not present

## 2019-07-04 DIAGNOSIS — Z7901 Long term (current) use of anticoagulants: Secondary | ICD-10-CM | POA: Diagnosis not present

## 2019-07-04 DIAGNOSIS — R0602 Shortness of breath: Secondary | ICD-10-CM | POA: Diagnosis not present

## 2019-07-04 DIAGNOSIS — I2510 Atherosclerotic heart disease of native coronary artery without angina pectoris: Secondary | ICD-10-CM | POA: Diagnosis present

## 2019-07-04 DIAGNOSIS — E7800 Pure hypercholesterolemia, unspecified: Secondary | ICD-10-CM | POA: Diagnosis present

## 2019-07-04 DIAGNOSIS — I10 Essential (primary) hypertension: Secondary | ICD-10-CM | POA: Diagnosis not present

## 2019-07-04 DIAGNOSIS — K219 Gastro-esophageal reflux disease without esophagitis: Secondary | ICD-10-CM | POA: Diagnosis present

## 2019-07-04 DIAGNOSIS — I5043 Acute on chronic combined systolic (congestive) and diastolic (congestive) heart failure: Secondary | ICD-10-CM | POA: Diagnosis present

## 2019-07-04 DIAGNOSIS — I255 Ischemic cardiomyopathy: Secondary | ICD-10-CM | POA: Diagnosis present

## 2019-07-04 DIAGNOSIS — I4891 Unspecified atrial fibrillation: Secondary | ICD-10-CM | POA: Diagnosis present

## 2019-07-04 DIAGNOSIS — Z20828 Contact with and (suspected) exposure to other viral communicable diseases: Secondary | ICD-10-CM | POA: Diagnosis present

## 2019-07-04 DIAGNOSIS — I480 Paroxysmal atrial fibrillation: Secondary | ICD-10-CM

## 2019-07-04 DIAGNOSIS — N182 Chronic kidney disease, stage 2 (mild): Secondary | ICD-10-CM | POA: Diagnosis present

## 2019-07-04 DIAGNOSIS — J690 Pneumonitis due to inhalation of food and vomit: Secondary | ICD-10-CM | POA: Diagnosis present

## 2019-07-04 DIAGNOSIS — J441 Chronic obstructive pulmonary disease with (acute) exacerbation: Secondary | ICD-10-CM | POA: Diagnosis present

## 2019-07-04 DIAGNOSIS — R1310 Dysphagia, unspecified: Secondary | ICD-10-CM | POA: Diagnosis not present

## 2019-07-04 DIAGNOSIS — M069 Rheumatoid arthritis, unspecified: Secondary | ICD-10-CM | POA: Diagnosis present

## 2019-07-04 DIAGNOSIS — E1122 Type 2 diabetes mellitus with diabetic chronic kidney disease: Secondary | ICD-10-CM | POA: Diagnosis present

## 2019-07-04 DIAGNOSIS — E1165 Type 2 diabetes mellitus with hyperglycemia: Secondary | ICD-10-CM

## 2019-07-04 DIAGNOSIS — N183 Chronic kidney disease, stage 3 unspecified: Secondary | ICD-10-CM | POA: Diagnosis present

## 2019-07-04 DIAGNOSIS — Z7951 Long term (current) use of inhaled steroids: Secondary | ICD-10-CM | POA: Diagnosis not present

## 2019-07-04 LAB — HEMOGLOBIN A1C
Hgb A1c MFr Bld: 6.7 % — ABNORMAL HIGH (ref 4.8–5.6)
Mean Plasma Glucose: 145.59 mg/dL

## 2019-07-04 LAB — SARS CORONAVIRUS 2 (TAT 6-24 HRS): SARS Coronavirus 2: POSITIVE — AB

## 2019-07-04 LAB — CBG MONITORING, ED: Glucose-Capillary: 175 mg/dL — ABNORMAL HIGH (ref 70–99)

## 2019-07-04 LAB — BRAIN NATRIURETIC PEPTIDE: B Natriuretic Peptide: 346 pg/mL — ABNORMAL HIGH (ref 0.0–100.0)

## 2019-07-04 LAB — GLUCOSE, CAPILLARY
Glucose-Capillary: 147 mg/dL — ABNORMAL HIGH (ref 70–99)
Glucose-Capillary: 271 mg/dL — ABNORMAL HIGH (ref 70–99)

## 2019-07-04 LAB — TROPONIN I (HIGH SENSITIVITY)
Troponin I (High Sensitivity): 9 ng/L (ref <18)
Troponin I (High Sensitivity): 9 ng/L (ref <18)

## 2019-07-04 LAB — D-DIMER, QUANTITATIVE: D-Dimer, Quant: 3.29 ug/mL-FEU — ABNORMAL HIGH (ref 0.00–0.50)

## 2019-07-04 MED ORDER — ACETAMINOPHEN 650 MG RE SUPP: 650.0000 mg | Freq: Four times a day (QID) | RECTAL | Status: DC | PRN

## 2019-07-04 MED ORDER — TECHNETIUM TO 99M ALBUMIN AGGREGATED
1.5000 | Freq: Once | INTRAVENOUS | Status: AC | PRN
  Administered 2019-07-04: 1.65 via INTRAVENOUS

## 2019-07-04 MED ORDER — FUROSEMIDE 10 MG/ML IJ SOLN
40.0000 mg | Freq: Every day | INTRAMUSCULAR | Status: DC
  Administered 2019-07-04 – 2019-07-05 (×2): 40 mg via INTRAVENOUS
  Filled 2019-07-04 (×2): qty 4

## 2019-07-04 MED ORDER — FUROSEMIDE 10 MG/ML IJ SOLN
40.0000 mg | Freq: Once | INTRAMUSCULAR | Status: AC
  Administered 2019-07-04: 40 mg via INTRAVENOUS
  Filled 2019-07-04: qty 4

## 2019-07-04 MED ORDER — INSULIN ASPART 100 UNIT/ML ~~LOC~~ SOLN
0.0000 [IU] | Freq: Three times a day (TID) | SUBCUTANEOUS | Status: DC
  Administered 2019-07-04: 2 [IU] via SUBCUTANEOUS
  Administered 2019-07-04: 1 [IU] via SUBCUTANEOUS
  Administered 2019-07-05: 7 [IU] via SUBCUTANEOUS
  Administered 2019-07-05: 1 [IU] via SUBCUTANEOUS
  Administered 2019-07-05: 5 [IU] via SUBCUTANEOUS
  Filled 2019-07-04: qty 1

## 2019-07-04 MED ORDER — SODIUM CHLORIDE 0.9 % IV SOLN
3.0000 g | Freq: Four times a day (QID) | INTRAVENOUS | Status: DC
  Administered 2019-07-04 – 2019-07-07 (×14): 3 g via INTRAVENOUS
  Filled 2019-07-04 (×2): qty 3
  Filled 2019-07-04: qty 8
  Filled 2019-07-04: qty 3
  Filled 2019-07-04 (×5): qty 8
  Filled 2019-07-04: qty 3
  Filled 2019-07-04: qty 8
  Filled 2019-07-04 (×2): qty 3
  Filled 2019-07-04: qty 8
  Filled 2019-07-04 (×2): qty 3
  Filled 2019-07-04 (×2): qty 8

## 2019-07-04 MED ORDER — ACETAMINOPHEN 325 MG PO TABS: 650.0000 mg | ORAL_TABLET | Freq: Four times a day (QID) | ORAL | Status: DC | PRN

## 2019-07-04 MED ORDER — DIPHENHYDRAMINE HCL 25 MG PO CAPS
25.0000 mg | ORAL_CAPSULE | Freq: Four times a day (QID) | ORAL | Status: DC | PRN
  Administered 2019-07-04: 25 mg via ORAL
  Filled 2019-07-04: qty 1

## 2019-07-04 MED ORDER — SODIUM CHLORIDE 0.9 % IV SOLN
3.0000 g | Freq: Once | INTRAVENOUS | Status: AC
  Administered 2019-07-04: 3 g via INTRAVENOUS
  Filled 2019-07-04: qty 8

## 2019-07-04 MED ORDER — SODIUM CHLORIDE 0.9 % IV SOLN: 2.0000 g | INTRAVENOUS | Status: DC

## 2019-07-04 MED ORDER — LOPERAMIDE HCL 2 MG PO CAPS
2.0000 mg | ORAL_CAPSULE | ORAL | Status: AC | PRN
  Administered 2019-07-04 – 2019-07-05 (×2): 2 mg via ORAL
  Filled 2019-07-04 (×2): qty 1

## 2019-07-04 MED ORDER — IPRATROPIUM-ALBUTEROL 0.5-2.5 (3) MG/3ML IN SOLN: 3.0000 mL | Freq: Three times a day (TID) | RESPIRATORY_TRACT | Status: DC

## 2019-07-04 NOTE — Progress Notes (Addendum)
PROGRESS NOTE  Robert [name] [id number] DOB: [birth year] DOA: 07/03/2019 PCP: Redmond School, MD  Brief History:  68 year old male with a history of COPD, coronary disease, paroxysmal atrial fibrillation on apixaban, hypertension, hyperlipidemia, diabetes mellitus type 2, CKD stage II presenting with a choking episode after eating some grits on 07/03/2019.  This resulted in a vigorous cough and spitting up some phlegm.  He had some chest burning at that time which was relieved with Rolaids.  Because of his shortness of breath and choking, he presented for further evaluation.  In the past week, the patient had denied any fevers, chills, chest pain, shortness breath, nausea, vomiting, or choking episodes.  He states that his lower extremity edema is about the same as usual.  He denied any worsening orthopnea or PND.  However he states that he is missed taking his Lasix over the past 2 to 3 days prior to admission.  In addition, the patient states that he has gained at least 30 pounds over the past week.  His last home weight was 220 pounds. Notably, the patient had a recent prolonged hospitalization from 04/11/2019 through 05/22/2019 when he suffered from [id number] pneumonia complicated by sepsis.  His hospitalization was complicated by respiratory failure requiring mechanical ventilation.  The patient developed MRSA pneumonia and aspiration.  He required reintubation after initial extubation.  His hospitalization was further complicated by acute on chronic systolic and diastolic CHF, paroxysmal atrial fibrillation, COPD exacerbation, and urinary retention.  He was subsequently discharged to [facility] where he stayed from 05/22/2019 through 06/07/2019.  He was discharged home on a dysphagia 3 diet In the emergency department, the patient was afebrile and hemodynamically stable.  With ambulation, the patient did desaturate to the 70% range.  As result, admission was requested for further evaluation.  BMP,  LFTs, and CBC were essentially unremarkable.  Troponin was 9x2.  EKG showed sinus rhythm with nonspecific T wave changes.  Chest x-ray showed interstitial prominence with improvement of his bibasilar opacities.  Assessment/Plan: Acute respiratory failure with hypoxia -Secondary to pulmonary edema>>> aspiration pneumonitis in the setting of underlying COPD -Presently stable on 2 L nasal cannula -Wean oxygen for saturation greater 92% -Personally reviewed chest x-ray--increased interstitial markings without consolidation  Acute on chronic systolic and diastolic CHF -[id number] echo EF 50-55%, normal RV -06/07/2017 echo EF 40-45% -Continue furosemide 40 mg IV daily -Daily weights -Accurate I's and O's  Aspiration pneumonitis -Speech therapy -Patient continue IV Unasyn  COPD -No wheezing on exam -Continue bronchodilators  Paroxysmal atrial fibrillation -Presently in sinus rhythm -Continue apixaban  -continue metoprolol tartrate  Diabetes mellitus type 2 -04/11/2019 hemoglobin A1c 7.4 -Holding Amaryl and Precose -NovoLog sliding scale  CKD stage II -Baseline creatinine 1.1-1.3 -Monitor with diuresis  Hyperlipidemia -Continue statin  Anxiety -Continue home dose of alprazolam  Essential hypertension -Continue metoprolol tartrate  Coronary artery disease -No chest pain presently -Personally reviewed EKG--sinus rhythm, nonspecific T wave changes    Total time spent 35 minutes.  Greater than 50% spent face to face counseling and coordinating care.     Disposition Plan:   Home in 1-2 days  Family Communication:  No Family at bedside  Consultants:  none  Code Status:  FULL   DVT Prophylaxis:  apixaban   Procedures: As Listed in Progress Note Above  Antibiotics: Unasyn 10/23>>>    Subjective: Pt complains of dry cough without hemoptysis.  Denies f/c,cp, n/v/d.,  He has some dyspnea on  exertion.  Denies abd pain, dysuria, hematuria, headache.  Objective:  Vitals:   07/04/19 0500 07/04/19 0530 07/04/19 0600 07/04/19 0630  BP: 139/88 (!) 142/80 (!) 143/73 136/82  Pulse: 90 76 79 80  Resp: 18 17 18 18   Temp:      TempSrc:      SpO2: 91% 96% 93% 91%  Weight:      Height:        Intake/Output Summary (Last 24 hours) at 07/04/2019 0736 Last data filed at 07/04/2019 0309 Gross per 24 hour  Intake -  Output 1075 ml  Net -1075 ml   Weight change:  Exam:   General:  Pt is alert, follows commands appropriately, not in acute distress  HEENT: No icterus, No thrush, No neck mass, [place]/AT  Cardiovascular: RRR, S1/S2, no rubs, no gallops+JVD  Respiratory: bibasilar crackles, no wheeze  Abdomen: Soft/+BS, non tender, non distended, no guarding  Extremities: 2+LEedema, No lymphangitis, No petechiae, No rashes, no synovitis   Data Reviewed: I have personally reviewed following labs and imaging studies Basic Metabolic Panel: Recent Labs  Lab 07/03/19 2313  NA 136  K 4.1  CL 106  CO2 23  GLUCOSE 143*  BUN 18  CREATININE 1.13  CALCIUM 8.8*   Liver Function Tests: Recent Labs  Lab 07/03/19 2313  AST 21  ALT 27  ALKPHOS 91  BILITOT 0.7  PROT 7.7  ALBUMIN 3.1*   No results for input(s): LIPASE, AMYLASE in the last 168 hours. No results for input(s): AMMONIA in the last 168 hours. Coagulation Profile: No results for input(s): INR, PROTIME in the last 168 hours. CBC: Recent Labs  Lab 07/03/19 2313  WBC 5.2  NEUTROABS 3.7  HGB 9.8*  HCT 31.7*  MCV 92.2  PLT 183   Cardiac Enzymes: No results for input(s): CKTOTAL, CKMB, CKMBINDEX, TROPONINI in the last 168 hours. BNP: Invalid input(s): POCBNP CBG: No results for input(s): GLUCAP in the last 168 hours. HbA1C: No results for input(s): HGBA1C in the last 72 hours. Urine analysis:    Component Value Date/Time   COLORURINE YELLOW 04/25/2019 0826   APPEARANCEUR HAZY (A) 04/25/2019 0826   LABSPEC 1.015 04/25/2019 0826   PHURINE 5.0 04/25/2019 0826   GLUCOSEU 150  (A) 04/25/2019 0826   HGBUR LARGE (A) 04/25/2019 0826   BILIRUBINUR NEGATIVE 04/25/2019 0826   KETONESUR NEGATIVE 04/25/2019 0826   PROTEINUR 30 (A) 04/25/2019 0826   UROBILINOGEN 0.2 12/22/2011 2130   NITRITE NEGATIVE 04/25/2019 0826   LEUKOCYTESUR NEGATIVE 04/25/2019 0826   Sepsis Labs: @LABRCNTIP (procalcitonin:4,lacticidven:4) )No results found for this or any previous visit (from the past 240 hour(s)).   Scheduled Meds: Continuous Infusions: . ampicillin-sulbactam (UNASYN) IV      Procedures/Studies: Dg Chest 2 View  Result Date: 07/03/2019 CLINICAL DATA:  Choked on grits. EXAM: CHEST - 2 VIEW COMPARISON:  Chest radiograph 05/23/2019 CT 12/09/2016 FINDINGS: Chronic hyperinflation and emphysema. Improvement in bibasilar airspace disease from prior exam. Improvement in interstitial opacities, residual increase interstitial opacities and Kerley B-lines. Blunting of the left costophrenic angle most consistent with scarring. Unchanged heart size and mediastinal contours. Coronary stent is visualized. No pneumothorax. IMPRESSION: 1. Improving bibasilar airspace disease from prior exam. 2. Improving interstitial opacities with Kerley B-lines consistent with pulmonary edema, however improved from September radiographs. 3. Blunting of the left costophrenic angle suggests scarring and left pleural thickening/small effusion. Electronically Signed   By: Keith Rake M.D.   On: 07/03/2019 23:19   Dg Swallowing Func-speech Pathology  Result  Date: 06/05/2019 Objective Swallowing Evaluation: Type of Study: MBS-Modified Barium Swallow Study  Patient Details Name: Robert [name] MRN: [id number] Date of Birth: Jan 22, 1951 Today's Date: 06/05/2019 SLP Time Calculation (min) (ACUTE ONLY): 18 min Past Medical History: Past Medical History: Diagnosis Date . Anxiety  . COPD (chronic obstructive pulmonary disease) ([place])  . Coronary artery disease  . Full dentures  . GERD (gastroesophageal reflux disease)    Rolaids as needed . High cholesterol  . History of MI (myocardial infarction)  . Hypertension   med. dosage increased 04/2016; has been on BP med. x 10 yrs. . Incarcerated umbilical hernia 0000000 . Inguinal hernia 05/2016  bilateral  . Insulin dependent diabetes mellitus ([place])  . Ischemic cardiomyopathy   a. 03/2015 EF 40-45% by LV gram. . Left leg cellulitis 10/08/2016 . Rheumatoid arthritis(714.0)  Past Surgical History: Past Surgical History: Procedure Laterality Date . CARDIAC CATHETERIZATION N/A 02/01/2015  Procedure: Left Heart Cath and Coronary Angiography;  Surgeon: Belva Crome, MD;  Location: [place] [facility];  Service: Cardiovascular;  Laterality: N/A; . CARDIAC CATHETERIZATION N/A 03/22/2015  Procedure: Left Heart Cath and Coronary Angiography;  Surgeon: Leonie Man, MD;  Location: [place] [facility];  Service: Cardiovascular;  Laterality: N/A; . CARDIAC CATHETERIZATION  09/05/2002; 03/09/2003; 04/10/2005;06/02/2005; 05/09/2006; 02/09/2007; 03/01/2007 . COLONOSCOPY  2008  Dr. Gala Romney: diverticulosis, hyperplastic polyp. . CORONARY ANGIOPLASTY  11/14/2012; 02/01/2015 . CORONARY ANGIOPLASTY WITH STENT PLACEMENT  05/16/2012  "1; makes total ~ 4" . CORONARY STENT INTERVENTION N/A 06/06/2017  Procedure: CORONARY STENT INTERVENTION;  Surgeon: Belva Crome, MD;  Location: [place] [facility];  Service: Cardiovascular;  Laterality: N/A; . INSERTION OF MESH Bilateral 05/24/2016  Procedure: INSERTION OF MESH;  Surgeon: Coralie Keens, MD;  Location: [place];  Service: General;  Laterality: Bilateral; . LAPAROSCOPIC CHOLECYSTECTOMY   . LAPAROSCOPIC INGUINAL HERNIA WITH UMBILICAL HERNIA Bilateral 05/24/2016  Procedure: BILATERAL LAPAROSCOPIC INGUINAL HERNIA REPAIR WITH MESH AND UMBILICAL HERNIA REPAIR WITH MESH;  Surgeon: Coralie Keens, MD;  Location: [place];  Service: General;  Laterality: Bilateral; . LEFT HEART CATH AND CORONARY ANGIOGRAPHY N/A 06/06/2017  Procedure: LEFT HEART CATH  AND CORONARY ANGIOGRAPHY;  Surgeon: Belva Crome, MD;  Location: [place] [facility];  Service: Cardiovascular;  Laterality: N/A; . LEFT HEART CATHETERIZATION WITH CORONARY ANGIOGRAM N/A 12/22/2011  Procedure: LEFT HEART CATHETERIZATION WITH CORONARY ANGIOGRAM;  Surgeon: Troy Sine, MD;  Location: [facility] CATH LAB;  Service: Cardiovascular;  Laterality: N/A; . LEFT HEART CATHETERIZATION WITH CORONARY ANGIOGRAM Bilateral 05/16/2012  Procedure: LEFT HEART CATHETERIZATION WITH CORONARY ANGIOGRAM;  Surgeon: Lorretta Harp, MD;  Location: [facility] CATH LAB;  Service: Cardiovascular;  Laterality: Bilateral; . LEFT HEART CATHETERIZATION WITH CORONARY ANGIOGRAM N/A 11/14/2012  Procedure: LEFT HEART CATHETERIZATION WITH CORONARY ANGIOGRAM;  Surgeon: Troy Sine, MD;  Location: [facility] CATH LAB;  Service: Cardiovascular;  Laterality: N/A; . LEFT HEART CATHETERIZATION WITH CORONARY ANGIOGRAM N/A 09/01/2013  Procedure: LEFT HEART CATHETERIZATION WITH CORONARY ANGIOGRAM;  Surgeon: Leonie Man, MD;  Location: [facility] CATH LAB;  Service: Cardiovascular;  Laterality: N/A; . [place]; 1973; 1985  x 3 . NM MYOCAR PERF WALL MOTION  08/30/2009  No significant ischemia . OPEN REDUCTION INTERNAL FIXATION (ORIF) DISTAL RADIAL FRACTURE Right 12/10/2016  Procedure: OPEN REDUCTION INTERNAL FIXATION (ORIF) DISTAL RADIAL FRACTURE;  Surgeon: Iran Planas, MD;  Location: [place];  Service: Orthopedics;  Laterality: Right; . PERCUTANEOUS CORONARY INTERVENTION-BALLOON ONLY  12/22/2011  Procedure: PERCUTANEOUS CORONARY INTERVENTION-BALLOON ONLY;  Surgeon: Troy Sine, MD;  Location: [facility] CATH LAB;  Service: Cardiovascular;; . PERCUTANEOUS CORONARY INTERVENTION-BALLOON ONLY  11/14/2012  Procedure: PERCUTANEOUS CORONARY INTERVENTION-BALLOON ONLY;  Surgeon: Troy Sine, MD;  Location: [facility] CATH LAB;  Service: Cardiovascular;; . PERCUTANEOUS CORONARY STENT INTERVENTION (PCI-S)  05/16/2012  Procedure: PERCUTANEOUS CORONARY STENT INTERVENTION (PCI-S);   Surgeon: Lorretta Harp, MD;  Location: [facility] CATH LAB;  Service: Cardiovascular;; . PERCUTANEOUS CORONARY STENT INTERVENTION (PCI-S)  09/01/2013  Procedure: PERCUTANEOUS CORONARY STENT INTERVENTION (PCI-S);  Surgeon: Leonie Man, MD;  Location: [facility] CATH LAB;  Service: Cardiovascular;; HPI: See H&P  Subjective: Pt was pleasant and cooperative.  He was agreeable to MBS. Assessment / Plan / Recommendation CHL IP CLINICAL IMPRESSIONS 06/05/2019 Clinical Impression Pt presents with improved pharyngeal abilities over previous MBS. Pt continues to presents with mild pharyngeal dysphagia with swallow initiated at pyriform sinuses with varying levels of penetration during swallow of thin liquids. With cups sips, pt is able to protect airway but has some aspirates under vocal cords when consuming consecutive sips via straw. Pt with mild throat clear that was ineffective in clearing aspirates. Additionally, when consuming barium tablet with thin liquids, table lodged in vallecula with bolus of puree required to clear. Pt appears at reduced risk of aspiraiton when following general aspiraiton precautions. Recommend continuing dysphagia 3 with thin liquids via cup, medicine whole in puree and intermittent supervision. SLP Visit Diagnosis Dysphagia, pharyngeal phase (R13.13) Attention and concentration deficit following -- Frontal lobe and executive function deficit following -- Impact on safety and function Mild aspiration risk   CHL IP TREATMENT RECOMMENDATION 06/05/2019 Treatment Recommendations Therapy as outlined in treatment plan below   Prognosis 05/29/2019 Prognosis for Safe Diet Advancement Good Barriers to Reach Goals Time post onset;Severity of deficits Barriers/Prognosis Comment -- CHL IP DIET RECOMMENDATION 06/05/2019 SLP Diet Recommendations Dysphagia 3 (Mech soft) solids;Thin liquid Liquid Administration via Cup Medication Administration Whole meds with puree Compensations Minimize environmental distractions;Slow  rate;Small sips/bites Postural Changes Seated upright at 90 degrees   CHL IP OTHER RECOMMENDATIONS 06/05/2019 Recommended Consults -- Oral Care Recommendations Oral care BID Other Recommendations --   CHL IP FOLLOW UP RECOMMENDATIONS 06/05/2019 Follow up Recommendations Inpatient Rehab   CHL IP FREQUENCY AND DURATION 05/29/2019 Speech Therapy Frequency (ACUTE ONLY) min 5x/week Treatment Duration 2 weeks      CHL IP ORAL PHASE 06/05/2019 Oral Phase WFL Oral - Pudding Teaspoon -- Oral - Pudding Cup -- Oral - Honey Teaspoon -- Oral - Honey Cup -- Oral - Nectar Teaspoon -- Oral - Nectar Cup -- Oral - Nectar Straw -- Oral - Thin Teaspoon -- Oral - Thin Cup -- Oral - Thin Straw -- Oral - Puree -- Oral - Mech Soft -- Oral - Regular -- Oral - Multi-Consistency -- Oral - Pill -- Oral Phase - Comment --  CHL IP PHARYNGEAL PHASE 06/05/2019 Pharyngeal Phase Impaired Pharyngeal- Pudding Teaspoon -- Pharyngeal -- Pharyngeal- Pudding Cup -- Pharyngeal -- Pharyngeal- Honey Teaspoon -- Pharyngeal -- Pharyngeal- Honey Cup NT Pharyngeal -- Pharyngeal- Nectar Teaspoon NT Pharyngeal -- Pharyngeal- Nectar Cup -- Pharyngeal -- Pharyngeal- Nectar Straw NT Pharyngeal -- Pharyngeal- Thin Teaspoon Delayed swallow initiation-pyriform sinuses;Reduced anterior laryngeal mobility;Reduced laryngeal elevation;Penetration/Aspiration during swallow Pharyngeal Material enters airway, remains ABOVE vocal cords then ejected out Pharyngeal- Thin Cup Delayed swallow initiation-pyriform sinuses;Reduced anterior laryngeal mobility;Reduced laryngeal elevation;Penetration/Aspiration during swallow;Pharyngeal residue - valleculae Pharyngeal Material enters airway, remains ABOVE vocal cords then ejected out Pharyngeal- Thin Straw Delayed swallow initiation-pyriform sinuses;Reduced anterior laryngeal mobility;Reduced laryngeal  elevation;Penetration/Aspiration during swallow Pharyngeal Material enters airway, CONTACTS cords and not ejected out Pharyngeal- Puree  Delayed swallow initiation-vallecula Pharyngeal -- Pharyngeal- Mechanical Soft NT Pharyngeal -- Pharyngeal- Regular -- Pharyngeal -- Pharyngeal- Multi-consistency -- Pharyngeal -- Pharyngeal- Pill Delayed swallow initiation-vallecula;Reduced anterior laryngeal mobility;Reduced laryngeal elevation Pharyngeal -- Pharyngeal Comment barium tablet lodged in vallecula with silent aspiration of thin liquids, required puree to clear tablet  CHL IP CERVICAL ESOPHAGEAL PHASE 06/05/2019 Cervical Esophageal Phase WFL Pudding Teaspoon -- Pudding Cup -- Honey Teaspoon -- Honey Cup -- Nectar Teaspoon -- Nectar Cup -- Nectar Straw -- Thin Teaspoon -- Thin Cup -- Thin Straw -- Puree -- Mechanical Soft -- Regular -- Multi-consistency -- Pill -- Cervical Esophageal Comment -- Happi Overton 06/05/2019, 1:03 PM               Orson Eva, DO  [facility] Pager [phone]  If 7PM-7AM, please contact night-coverage [email or web address] Password [email or web address] 07/04/2019, 7:36 AM   LOS: 0 days

## 2019-07-04 NOTE — ED Notes (Signed)
Diet mist given to patient

## 2019-07-04 NOTE — ED Notes (Signed)
Pt. Was sitting on the side of the bed prior to ambulating and was at 90% O2 saturation. Once beginning to ambulate he dropped to 85% before staff directed him back to his room. The Pt. O2 saturation dropped to 79% before staff was able to get him to lie down. Staff stayed at bedside and helped with the pt. Breathing until it returned to 90%. Pulse was also elevated to 125BPM.

## 2019-07-04 NOTE — ED Notes (Signed)
ED TO INPATIENT HANDOFF REPORT  ED Nurse Name and Phone #:  Dewaine Oats [phone]  S Name/Age/Gender Robert [name] 68 y.o. male Room/Bed: A304/A304-01  Code Status   Code Status: Full Code  Home/SNF/Other Home Patient oriented to: self, place, time and situation Is this baseline? Yes   Triage Complete: Triage complete  Chief Complaint Acute respiratory failure with hypoxia (HCC) [J96.01] Aspiration pneumonia, unspecified aspiration pneumonia type, unspecified laterality, unspecified part of lung ([place]) [J69.0]  Triage Note Pt brought to ED via [place] after choking on grits at 1800. Pt with cough since choking. Pt denies SOB.    Allergies Allergies  Allergen Reactions  . Ivp Dye [Iodinated Diagnostic Agents] Itching and Rash    Level of Care/Admitting Diagnosis ED Disposition    ED Disposition Condition [facility] Area: [facility] [id number]  Level of Care: Telemetry [5]  Covid Evaluation: Asymptomatic Screening Protocol (No Symptoms)  Diagnosis: Aspiration pneumonia [facility]) [id number]  Admitting Physician: Reubin Milan [id number]  Attending Physician: Reubin Milan [id number]  Estimated length of stay: past midnight tomorrow  Certification:: I certify this patient will need inpatient services for at least 2 midnights  PT Class (Do Not Modify): Inpatient [101]  PT Acc Code (Do Not Modify): Private [1]       B Medical/Surgery History Past Medical History:  Diagnosis Date  . Anxiety   . CHF (congestive heart failure) ([place])   . Collagen vascular disease ([place])   . COPD (chronic obstructive pulmonary disease) ([place])   . Coronary artery disease   . COVID-19   . Full dentures   . GERD (gastroesophageal reflux disease)    Rolaids as needed  . High cholesterol   . History of MI (myocardial infarction)   . Hypertension    med. dosage increased 04/2016; has been on BP med. x 10 yrs.  . Incarcerated umbilical hernia 0000000  .  Inguinal hernia 05/2016   bilateral   . Insulin dependent diabetes mellitus   . Ischemic cardiomyopathy    a. 03/2015 EF 40-45% by LV gram.  . Left leg cellulitis 10/08/2016  . Rheumatoid arthritis(714.0)    Past Surgical History:  Procedure Laterality Date  . CARDIAC CATHETERIZATION N/A 02/01/2015   Procedure: Left Heart Cath and Coronary Angiography;  Surgeon: Belva Crome, MD;  Location: [place] CV LAB;  Service: Cardiovascular;  Laterality: N/A;  . CARDIAC CATHETERIZATION N/A 03/22/2015   Procedure: Left Heart Cath and Coronary Angiography;  Surgeon: Leonie Man, MD;  Location: [place] CV LAB;  Service: Cardiovascular;  Laterality: N/A;  . CARDIAC CATHETERIZATION  09/05/2002; 03/09/2003; 04/10/2005;06/02/2005; 05/09/2006; 02/09/2007; 03/01/2007  . COLONOSCOPY  2008   Dr. Gala Romney: diverticulosis, hyperplastic polyp.  . CORONARY ANGIOPLASTY  11/14/2012; 02/01/2015  . CORONARY ANGIOPLASTY WITH STENT PLACEMENT  05/16/2012   "1; makes total ~ 4"  . CORONARY STENT INTERVENTION N/A 06/06/2017   Procedure: CORONARY STENT INTERVENTION;  Surgeon: Belva Crome, MD;  Location: [place] CV LAB;  Service: Cardiovascular;  Laterality: N/A;  . INSERTION OF MESH Bilateral 05/24/2016   Procedure: INSERTION OF MESH;  Surgeon: Coralie Keens, MD;  Location: [place];  Service: General;  Laterality: Bilateral;  . LAPAROSCOPIC CHOLECYSTECTOMY    . LAPAROSCOPIC INGUINAL HERNIA WITH UMBILICAL HERNIA Bilateral 05/24/2016   Procedure: BILATERAL LAPAROSCOPIC INGUINAL HERNIA REPAIR WITH MESH AND UMBILICAL HERNIA REPAIR WITH MESH;  Surgeon: Coralie Keens, MD;  Location: [place];  Service: General;  Laterality: Bilateral;  . LEFT HEART CATH AND CORONARY ANGIOGRAPHY N/A 06/06/2017   Procedure: LEFT HEART CATH AND CORONARY ANGIOGRAPHY;  Surgeon: Belva Crome, MD;  Location: [place] CV LAB;  Service: Cardiovascular;  Laterality: N/A;  . LEFT HEART CATHETERIZATION WITH  CORONARY ANGIOGRAM N/A 12/22/2011   Procedure: LEFT HEART CATHETERIZATION WITH CORONARY ANGIOGRAM;  Surgeon: Troy Sine, MD;  Location: [facility] CATH LAB;  Service: Cardiovascular;  Laterality: N/A;  . LEFT HEART CATHETERIZATION WITH CORONARY ANGIOGRAM Bilateral 05/16/2012   Procedure: LEFT HEART CATHETERIZATION WITH CORONARY ANGIOGRAM;  Surgeon: Lorretta Harp, MD;  Location: [facility] CATH LAB;  Service: Cardiovascular;  Laterality: Bilateral;  . LEFT HEART CATHETERIZATION WITH CORONARY ANGIOGRAM N/A 11/14/2012   Procedure: LEFT HEART CATHETERIZATION WITH CORONARY ANGIOGRAM;  Surgeon: Troy Sine, MD;  Location: [facility] CATH LAB;  Service: Cardiovascular;  Laterality: N/A;  . LEFT HEART CATHETERIZATION WITH CORONARY ANGIOGRAM N/A 09/01/2013   Procedure: LEFT HEART CATHETERIZATION WITH CORONARY ANGIOGRAM;  Surgeon: Leonie Man, MD;  Location: [facility] CATH LAB;  Service: Cardiovascular;  Laterality: N/A;  . [employer]; 1973; 1985   x 3  . NM MYOCAR PERF Tajanay Hurley MOTION  08/30/2009   No significant ischemia  . OPEN REDUCTION INTERNAL FIXATION (ORIF) DISTAL RADIAL FRACTURE Right 12/10/2016   Procedure: OPEN REDUCTION INTERNAL FIXATION (ORIF) DISTAL RADIAL FRACTURE;  Surgeon: Iran Planas, MD;  Location: [place];  Service: Orthopedics;  Laterality: Right;  . PERCUTANEOUS CORONARY INTERVENTION-BALLOON ONLY  12/22/2011   Procedure: PERCUTANEOUS CORONARY INTERVENTION-BALLOON ONLY;  Surgeon: Troy Sine, MD;  Location: [facility] CATH LAB;  Service: Cardiovascular;;  . PERCUTANEOUS CORONARY INTERVENTION-BALLOON ONLY  11/14/2012   Procedure: PERCUTANEOUS CORONARY INTERVENTION-BALLOON ONLY;  Surgeon: Troy Sine, MD;  Location: [facility] CATH LAB;  Service: Cardiovascular;;  . PERCUTANEOUS CORONARY STENT INTERVENTION (PCI-S)  05/16/2012   Procedure: PERCUTANEOUS CORONARY STENT INTERVENTION (PCI-S);  Surgeon: Lorretta Harp, MD;  Location: [facility] CATH LAB;  Service: Cardiovascular;;  . PERCUTANEOUS CORONARY STENT INTERVENTION  (PCI-S)  09/01/2013   Procedure: PERCUTANEOUS CORONARY STENT INTERVENTION (PCI-S);  Surgeon: Leonie Man, MD;  Location: [facility] CATH LAB;  Service: Cardiovascular;;     A IV Location/Drains/Wounds Patient Lines/Drains/Airways Status   Active Line/Drains/Airways    Name:   Placement date:   Placement time:   Site:   Days:   Peripheral IV 07/04/19 Right;Anterior Forearm   07/04/19    0050    Forearm   less than 1   Pressure Injury 04/17/19 Sacrum Medial Unstageable - Full thickness tissue loss in which the base of the ulcer is covered by slough (yellow, tan, gray, green or brown) and/or eschar (tan, brown or black) in the wound bed. dark purple with some skin sloug   04/17/19    0730     78   Pressure Injury 04/17/19 Buttocks Right;Lower Unstageable - Full thickness tissue loss in which the base of the ulcer is covered by slough (yellow, tan, gray, green or brown) and/or eschar (tan, brown or black) in the wound bed.   04/17/19    0730     78   Pressure Injury 04/17/19 Buttocks Left;Lower Unstageable - Full thickness tissue loss in which the base of the ulcer is covered by slough (yellow, tan, gray, green or brown) and/or eschar (tan, brown or black) in the wound bed. WOC updated to Stage 3 on    04/17/19    0730     78  Intake/Output Last 24 hours  Intake/Output Summary (Last 24 hours) at 07/04/2019 1419 Last data filed at 07/04/2019 1333 Gross per 24 hour  Intake 200 ml  Output 1375 ml  Net -1175 ml    Labs/Imaging Results for orders placed or performed during the hospital encounter of 07/03/19 (from the past 48 hour(s))  Lactic acid, plasma     Status: None   Collection Time: 07/03/19 11:13 PM  Result Value Ref Range   Lactic Acid, Venous 0.7 0.5 - 1.9 mmol/L    Comment: Performed at [facility], [address], [place], [place] [address]  CBC with Differential/Platelet     Status: Abnormal   Collection Time: 07/03/19 11:13 PM  Result Value Ref Range   WBC 5.2 4.0 -  10.5 K/uL   RBC 3.44 (L) 4.22 - 5.81 MIL/uL   Hemoglobin 9.8 (L) 13.0 - 17.0 g/dL   HCT 31.7 (L) 39.0 - 52.0 %   MCV 92.2 80.0 - 100.0 fL   MCH 28.5 26.0 - 34.0 pg   MCHC 30.9 30.0 - 36.0 g/dL   RDW 15.0 11.5 - 15.5 %   Platelets 183 150 - 400 K/uL   nRBC 0.0 0.0 - 0.2 %   Neutrophils Relative % 70 %   Neutro Abs 3.7 1.7 - 7.7 K/uL   Lymphocytes Relative 18 %   Lymphs Abs 0.9 0.7 - 4.0 K/uL   Monocytes Relative 7 %   Monocytes Absolute 0.4 0.1 - 1.0 K/uL   Eosinophils Relative 4 %   Eosinophils Absolute 0.2 0.0 - 0.5 K/uL   Basophils Relative 1 %   Basophils Absolute 0.1 0.0 - 0.1 K/uL   Immature Granulocytes 0 %   Abs Immature Granulocytes 0.02 0.00 - 0.07 K/uL    Comment: Performed at [facility], [address] [place], [place] [address]  Comprehensive metabolic panel     Status: Abnormal   Collection Time: 07/03/19 11:13 PM  Result Value Ref Range   Sodium 136 135 - 145 mmol/L   Potassium 4.1 3.5 - 5.1 mmol/L   Chloride 106 98 - 111 mmol/L   CO2 23 22 - 32 mmol/L   Glucose, Bld 143 (H) 70 - 99 mg/dL   BUN 18 8 - 23 mg/dL   Creatinine, Ser 1.13 0.61 - 1.24 mg/dL   Calcium 8.8 (L) 8.9 - 10.3 mg/dL   Total Protein 7.7 6.5 - 8.1 g/dL   Albumin 3.1 (L) 3.5 - 5.0 g/dL   AST 21 15 - 41 U/L   ALT 27 0 - 44 U/L   Alkaline Phosphatase 91 38 - 126 U/L   Total Bilirubin 0.7 0.3 - 1.2 mg/dL   GFR calc non Af Amer >60 >60 mL/min   GFR calc Af Amer >60 >60 mL/min   Anion gap 7 5 - 15    Comment: Performed at [facility], [address] [place], [place] [address]  Brain natriuretic peptide     Status: Abnormal   Collection Time: 07/03/19 11:13 PM  Result Value Ref Range   B Natriuretic Peptide 346.0 (H) 0.0 - 100.0 pg/mL    Comment: Performed at [facility], [address] [place], [place] [address]  Troponin I (High Sensitivity)     Status: None   Collection Time: 07/03/19 11:13 PM  Result Value Ref Range   Troponin I (High Sensitivity) 9 <18 ng/L    Comment:  (NOTE) Elevated high sensitivity troponin I (hsTnI) values and significant  changes across serial measurements  may suggest ACS but many other  chronic and acute conditions are known to elevate hsTnI results.  Refer to the "Links" section for chest pain algorithms and additional  guidance. Performed at [facility], [address] [place], [place] [address]   D-dimer, quantitative (not at [facility])     Status: Abnormal   Collection Time: 07/03/19 11:13 PM  Result Value Ref Range   D-Dimer, Quant 3.29 (H) 0.00 - 0.50 ug/mL-FEU    Comment: (NOTE) At the manufacturer cut-off of 0.50 ug/mL FEU, this assay has been documented to exclude PE with a sensitivity and negative predictive value of 97 to 99%.  At this time, this assay has not been approved by the FDA to exclude DVT/VTE. Results should be correlated with clinical presentation. Performed at [facility], [address] [place], [place] [address]   Blood culture (routine x 2)     Status: None (Preliminary result)   Collection Time: 07/04/19 12:50 AM   Specimen: Vein; Blood  Result Value Ref Range   Specimen Description BLOOD RIGHT ANTECUBITAL    Special Requests      BOTTLES DRAWN AEROBIC AND ANAEROBIC RIGHT ANTECUBITAL   Culture      NO GROWTH < 12 HOURS Performed at [facility], [address] [place], [place] [address]    Report Status PENDING   Blood culture (routine x 2)     Status: None (Preliminary result)   Collection Time: 07/04/19 12:56 AM   Specimen: Peripheral; Blood  Result Value Ref Range   Specimen Description BLOOD LEFT ANTECUBITAL    Special Requests      BOTTLES DRAWN AEROBIC AND ANAEROBIC Blood Culture adequate volume   Culture      NO GROWTH < 12 HOURS Performed at [facility], [address] [place], [place] [address]    Report Status PENDING   Troponin I (High Sensitivity)     Status: None   Collection Time: 07/04/19 12:56 AM  Result Value Ref Range   Troponin I (High Sensitivity) 9 <18 ng/L     Comment: (NOTE) Elevated high sensitivity troponin I (hsTnI) values and significant  changes across serial measurements may suggest ACS but many other  chronic and acute conditions are known to elevate hsTnI results.  Refer to the "Links" section for chest pain algorithms and additional  guidance. Performed at [facility], [address] [place], [place] [address]   CBG monitoring, ED     Status: Abnormal   Collection Time: 07/04/19  1:25 PM  Result Value Ref Range   Glucose-Capillary 175 (H) 70 - 99 mg/dL   Dg Chest 2 View  Result Date: 07/03/2019 CLINICAL DATA:  Choked on grits. EXAM: CHEST - 2 VIEW COMPARISON:  Chest radiograph 05/23/2019 CT 12/09/2016 FINDINGS: Chronic hyperinflation and emphysema. Improvement in bibasilar airspace disease from prior exam. Improvement in interstitial opacities, residual increase interstitial opacities and Ker[place] B-lines. Blunting of the left costophrenic angle most consistent with scarring. Unchanged heart size and mediastinal contours. Coronary stent is visualized. No pneumothorax. IMPRESSION: 1. Improving bibasilar airspace disease from prior exam. 2. Improving interstitial opacities with Ker[place] B-lines consistent with pulmonary edema, however improved from September radiographs. 3. Blunting of the left costophrenic angle suggests scarring and left pleural thickening/small effusion. Electronically Signed   By: Keith Rake M.D.   On: 07/03/2019 23:19   Nm Pulmonary Perfusion  Result Date: 07/04/2019 CLINICAL DATA:  Choked on food yesterday. Persistent chest pain and shortness of breath. EXAM: NUCLEAR MEDICINE PERFUSION LUNG  SCAN TECHNIQUE: Perfusion images were obtained in multiple projections after intravenous injection of radiopharmaceutical. Ventilation scans intentionally deferred if perfusion scan and chest x-ray adequate for interpretation during COVID 19 epidemic. RADIOPHARMACEUTICALS:  1.65 mCi Tc-77m MAA IV COMPARISON:  Chest x-ray 07/03/2019  FINDINGS: No segmental or subsegmental perfusion defects are identified to suggest pulmonary embolism. IMPRESSION: Negative perfusion lung scan for pulmonary embolism. Electronically Signed   By: Marijo Sanes M.D.   On: 07/04/2019 07:35    Pending Labs Unresulted Labs (From admission, onward)    Start     Ordered   07/05/19 0500  CBC with Differential/Platelet  Daily,   R     07/04/19 0217   07/05/19 0500  Comprehensive metabolic panel  Tomorrow morning,   R     07/04/19 0217   07/05/19 [id number]  Basic metabolic panel  Tomorrow morning,   R     07/04/19 1049   07/05/19 0500  Magnesium  Tomorrow morning,   R     07/04/19 1049   07/04/19 1050  Hemoglobin A1c  Once,   STAT    Comments: To assess prior glycemic control    07/04/19 1049   07/04/19 0217  Sputum culture  (Non-severe pneumonia (non-ICU care) in adult without resistant organism risk factors.)  Once,   STAT    Question:  Patient immune status  Answer:  Immunocompromised   07/04/19 0216   07/04/19 0029  SARS CORONAVIRUS 2 (TAT 6-24 HRS) Nasopharyngeal Nasopharyngeal Swab  (Asymptomatic/Tier 2 Patients Labs)  Once,   STAT    Question Answer Comment  Is this test for diagnosis or screening Screening   Symptomatic for COVID-19 as defined by CDC No   Hospitalized for COVID-19 Yes   Admitted to ICU for COVID-19 Yes   Previously tested for COVID-19 Yes   Resident in a congregate (group) care setting No   Employed in healthcare setting No      07/04/19 0028          Vitals/Pain Today's Vitals   07/04/19 1245 07/04/19 1300 07/04/19 1407 07/04/19 1418  BP: (!) 145/73 129/85 135/79   Pulse: 96 98 81   Resp: 20 19 19    Temp:   98.6 F (37 C)   TempSrc:   Oral   SpO2: 94% 94% 96%   Weight:      Height:      PainSc:    0-No pain    Isolation Precautions No active isolations  Medications Medications  acetaminophen (TYLENOL) tablet 650 mg (has no administration in time range)    Or  acetaminophen (TYLENOL) suppository  650 mg (has no administration in time range)  Ampicillin-Sulbactam (UNASYN) 3 g in sodium chloride 0.9 % 100 mL IVPB (0 g Intravenous Stopped 07/04/19 1333)  furosemide (LASIX) injection 40 mg (40 mg Intravenous Given 07/04/19 1102)  insulin aspart (novoLOG) injection 0-9 Units (2 Units Subcutaneous Given 07/04/19 1343)  ipratropium-albuterol (DUONEB) 0.5-2.5 (3) MG/3ML nebulizer solution 3 mL (3 mLs Nebulization Not Given 07/04/19 1322)  furosemide (LASIX) injection 40 mg (40 mg Intravenous Given 07/04/19 0056)  Ampicillin-Sulbactam (UNASYN) 3 g in sodium chloride 0.9 % 100 mL IVPB (0 g Intravenous Stopped 07/04/19 0145)  technetium albumin aggregated (MAA) injection solution 1.5 millicurie ([id number] millicuries Intravenous Contrast Given 07/04/19 0650)    Mobility walks Moderate fall risk   Focused Assessments    R Recommendations: See Admitting Provider Note  Report given to: Lilia Pro, RN [id number]  Additional Notes:

## 2019-07-04 NOTE — Progress Notes (Signed)
Verbal order given from Dr. Carles Collet to place patient on droplet/contact precautions due to COVID positive testing and patient history with COVID infection. Patient placed on precautions and taught to wear mask during staff entrance in room, patient verbalizes understanding.

## 2019-07-04 NOTE — Progress Notes (Signed)
CRITICAL VALUE ALERT  Critical Value:  COVID positive  Date & Time Notied:  07/04/2019 180  Provider Notified: Dr. Carles Collet paged via amion  Orders Received/Actions taken: No order given at this time

## 2019-07-04 NOTE — ED Notes (Signed)
Pt stated he needed an in and out cath. Bladder scan showed >200cc in bladder after urinating 350cc in urinal.  Pt asked if he wanted indwelling catheter, pt states he does not and that he will try the urinal again.

## 2019-07-04 NOTE — Progress Notes (Signed)
Pharmacy Antibiotic Note  Robert [name] is a 68 y.o. male admitted on 07/03/2019 with possible asp pna.  Pharmacy has been consulted for Unasyn dosing.  Plan: Unasyn 3gm IV q6h Will f/u renal function, micro data, and pt's clinical condition  Height: 6' (182.9 cm) Weight: 220 lb (99.8 kg) IBW/kg (Calculated) : 77.6  Temp (24hrs), Avg:98.8 F (37.1 C), Min:98.8 F (37.1 C), Max:98.8 F (37.1 C)  Recent Labs  Lab 07/03/19 2313  WBC 5.2  CREATININE 1.13  LATICACIDVEN 0.7    Estimated Creatinine Clearance: 78.7 mL/min (by C-G formula based on SCr of 1.13 mg/dL).    Allergies  Allergen Reactions  . Ivp Dye [Iodinated Diagnostic Agents] Itching and Rash    Antimicrobials this admission: 10/23 Unasyn>>   Microbiology results: 10/23 BCx:   Sputum:   SARS coronavirus:  Thank you for allowing pharmacy to be a part of this patient's care.  Sherlon Handing, PharmD, BCPS 07/04/2019 2:17 AM

## 2019-07-04 NOTE — TOC Initial Note (Signed)
Transition of Care [facility]) - Initial/Assessment Note    Patient Details  Name: Robert [name] MRN: [id number] Date of Birth: 03/16/1951  [facility]) CM/SW Contact:    Shade Flood, LCSW Phone Number: 07/04/2019, 3:04 PM  Clinical Narrative:                  Pt admitted from home. He is high risk for readmission. Pt status discussed with MD in [place] earlier this AM. Pt was at [facility] for almost two months over the summer. He has since discharged home and been independent in ADLs.   Pt is established with a PCP and does not have difficulty obtaining medications as needed.  Will monitor for home health and/or Home O2 needs.  TOC will follow.  Expected Discharge Plan: Home/Self Care Barriers to Discharge: Continued Medical Work up   Patient Goals and CMS Choice        Expected Discharge Plan and Services Expected Discharge Plan: Home/Self Care       Living arrangements for the past 2 months: Single Family Home                                      Prior Living Arrangements/Services Living arrangements for the past 2 months: Single Family Home Lives with:: Self Patient language and need for interpreter reviewed:: Yes Do you feel safe going back to the place where you live?: Yes      Need for Family Participation in Patient Care: No (Comment) Care giver support system in place?: Yes (comment)   Criminal Activity/Legal Involvement Pertinent to Current Situation/Hospitalization: No - Comment as needed  Activities of Daily Living Home Assistive Devices/Equipment: Cane (specify quad or straight), Walker (specify type), Grab bars in shower, Shower chair with back ADL Screening (condition at time of admission) Patient's cognitive ability adequate to safely complete daily activities?: Yes Is the patient deaf or have difficulty hearing?: No Does the patient have difficulty seeing, even when wearing glasses/contacts?: No Does the patient have  difficulty concentrating, remembering, or making decisions?: No Patient able to express need for assistance with ADLs?: Yes Does the patient have difficulty dressing or bathing?: No Independently performs ADLs?: Yes (appropriate for developmental age) Does the patient have difficulty walking or climbing stairs?: No Weakness of Legs: None Weakness of Arms/Hands: None  Permission Sought/Granted                  Emotional Assessment Appearance:: Appears stated age Attitude/Demeanor/Rapport: Engaged Affect (typically observed): Pleasant Orientation: : Oriented to Self, Oriented to Place, Oriented to  Time, Oriented to Situation Alcohol / Substance Use: Not Applicable Psych Involvement: No (comment)  Admission diagnosis:  Acute respiratory failure with hypoxia (HCC) [J96.01] Aspiration pneumonia, unspecified aspiration pneumonia type, unspecified laterality, unspecified part of lung ([place]) [J69.0] Patient Active Problem List   Diagnosis Date Noted  . Aspiration pneumonia ([place]) 07/04/2019  . Unspecified atrial fibrillation ([place]) 07/04/2019  . Anemia 07/04/2019  . Aspiration pneumonitis ([place]) 07/04/2019  . Acute respiratory failure with hypoxia ([place])   . CKD (chronic kidney disease), stage III   . Controlled type 2 diabetes mellitus with hyperglycemia, without long-term current use of insulin ([place])   . Urinary retention   . 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 Hypokalemia   . Physical debility 05/22/2019  . Demand ischemia ([place]) 05/20/2019  . Flash pulmonary edema ([place]) 05/20/2019  .  Stage II decubitus ulcer ([place]) 05/18/2019  . Aspiration pneumonia of both lower lobes due to gastric secretions ([place]) 05/15/2019  . MRSA pneumonia ([place]) 05/15/2019  . Severe sepsis with septic shock ([place]) 05/15/2019  . NSTEMI (non-ST elevated myocardial infarction) ([place]) 05/15/2019  . Staphylococcal pneumonia ([place]) 04/20/2019  . HCAP (healthcare-associated pneumonia) 04/20/2019  . Pneumonia  due to COVID-19 virus 04/18/2019  . COPD (chronic obstructive pulmonary disease) with emphysema ([place]) 04/18/2019  . AKI (acute kidney injury) ([place]) 04/18/2019  . Diabetes mellitus type 2, uncontrolled, with complications ([place]) [id number]  . Pressure injury of skin 04/18/2019  . Atrial fibrillation with RVR ([place]) 04/15/2019  . COVID-19 virus detected 04/12/2019  . Acute respiratory distress syndrome (ARDS) due to COVID-19 virus ([place]) 04/12/2019  . Hypoxia   . Sepsis ([place])   . PAD (peripheral artery disease) ([place]) 03/27/2019  . Pulmonary edema 08/27/2018  . Acute respiratory failure with hypoxemia ([place]) 08/27/2018  . Chronic combined systolic and diastolic heart failure ([place]) 08/27/2018  . Dyslipidemia (high LDL; low HDL) 03/29/2018  . Dysphagia 01/02/2018  . Diarrhea 01/02/2018  . Family history of colon cancer 01/02/2018  . Ischemic cardiomyopathy   . Insulin dependent diabetes mellitus   . Hypertension   . History of MI (myocardial infarction)   . GERD (gastroesophageal reflux disease)   . Full dentures   . Coronary artery disease   . Anxiety   . Acute ST elevation myocardial infarction (STEMI) of inferior wall ([place]) 06/06/2017  . Varicose veins of left lower extremity with complications [id number]  . Cellulitis of left leg without foot   . Right radial fracture 12/11/2016  . T3 vertebral fracture ([place]) 12/11/2016  . Fall 12/09/2016  . Thrombocytopenia ([place]) 10/09/2016  . Left leg cellulitis 10/08/2016  . Diabetes ([place]) 10/08/2016  . Inguinal hernia 05/12/2016  . Incarcerated umbilical hernia [id number]  . Chronic combined systolic and diastolic CHF (congestive heart failure) ([place]) 04/25/2016  . High cholesterol   . Controlled type 2 diabetes mellitus with diabetic peripheral angiopathy without gangrene, with long-term current use of insulin ([place])   . Essential hypertension   . Non-STEMI (non-ST elevated myocardial infarction) ([place])   . CAD S/P percutaneous coronary  angioplasty   . Unstable angina ([place]) 03/20/2015  . ICM-EF 35% at cath 02/01/15 02/02/2015  . DM type 2 causing vascular disease, not at goal [facility]) 05/29/2014  . Dyspnea 10/20/2013  . COPD (chronic obstructive pulmonary disease) ([place]) 09/03/2013  . Hyperlipidemia   . Old myocardial infarction 09/01/2013  . Abnormal nuclear stress test 05/17/2012  . S/P CFX PTCAI for ISR 02/01/15 05/17/2012  . PVD, chronic LLE 12/22/2011  . Rheumatoid arthritis ([place]) 12/22/2011  . Contrast media allergy 12/22/2011  . Tobacco abuse 12/21/2011   PCP:  Redmond School, MD Pharmacy:   [facility], [place] [id number] [place] [id number] Phone: [phone] Fax: [facility] [place], [place] [address] Phone: [phone] Fax: [phone]     Social Determinants of Health (SDOH) Interventions    Readmission Risk Interventions Readmission Risk Prevention Plan 07/04/2019  Transportation Screening Complete  Social Work Consult for [place] Planning/Counseling [place] Not Applicable  Medication Review Press photographer) Complete  Some recent data might be hidden

## 2019-07-04 NOTE — H&P (Signed)
History and Physical    Robert [name] [id number] DOB: 07-10-1951 DOA: 07/03/2019  PCP: Redmond School, MD   Patient coming from: Home.  I have personally briefly reviewed patient's old medical records in [place]  Chief Complaint: Cough and shortness of breath.  HPI: Robert [name] is a 68 y.o. male with medical history significant of anxiety, COPD, CAD, history of MI, GERD, hyperlipidemia, hypertension, incarcerated umbilical hernia, history of bilateral inguinal hernia, diabetes mellitus, history of left leg cellulitis, rheumatoid arthritis who was recently discharged from [facility] after a 60 days admission due to COVID-19 pneumonia with multiple complications.  While at [facility], he had difficulty swallowing, but passed all tests all other than dry compacted food items.  Tonight, while he was eating some grits in the evening he choke and had a coughing spell, which lasted several minutes.  He became short of breath and was initially hypoxic in the 80s when seen by EMS.  He was preemptively started on ampicillin/sulbactam in the emergency department for suspected aspiration pneumonia.  He denies recent fever, chills, sore throat or rhinorrhea.  No chest pain, palpitations, diaphoresis, but gets frequent lower extremity edema.  He denies abdominal pain, nausea or emesis, diarrhea or constipation, melena or hematochezia.  No dysuria, frequency or hematuria.  ED Course: Initial vital signs temperature 98.8 F, pulse 94, respiration 21, blood pressure 155/70 mmHg and O2 sat 94% on room air.  The  CBC shows a white count of 5.2, hemoglobin 9.8 g/dL and platelets 183.  D-dimer was 3.21 mcg/mL.  Lactic acid was 0.7 mmol/L.  CMP shows a glucose of 143 mg/dL and albumin of 3.1 g/dL.  Calcium is normal when corrected to albumin.  The rest of the CMP values are within expected range.  His BMP was 346.0 pg/mL.  Troponin x2 was normal.  Blood cultures x2 were drawn.  His chest radiograph  show improving bibasilar airspace disease from prior exam.  Please see images and full regular report for further detail.  Review of Systems: As per HPI otherwise 10 point review of systems negative.   Past Medical History:  Diagnosis Date   Anxiety    COPD (chronic obstructive pulmonary disease) (HCC)    Coronary artery disease    COVID-19    Full dentures    GERD (gastroesophageal reflux disease)    Rolaids as needed   High cholesterol    History of MI (myocardial infarction)    Hypertension    med. dosage increased 04/2016; has been on BP med. x 10 yrs.   Incarcerated umbilical hernia 0000000   Inguinal hernia 05/2016   bilateral    Insulin dependent diabetes mellitus    Ischemic cardiomyopathy    a. 03/2015 EF 40-45% by LV gram.   Left leg cellulitis 10/08/2016   Rheumatoid arthritis(714.0)     Past Surgical History:  Procedure Laterality Date   CARDIAC CATHETERIZATION N/A 02/01/2015   Procedure: Left Heart Cath and Coronary Angiography;  Surgeon: Belva Crome, MD;  Location: [place] [facility];  Service: Cardiovascular;  Laterality: N/A;   CARDIAC CATHETERIZATION N/A 03/22/2015   Procedure: Left Heart Cath and Coronary Angiography;  Surgeon: Leonie Man, MD;  Location: [place] [facility];  Service: Cardiovascular;  Laterality: N/A;   CARDIAC CATHETERIZATION  09/05/2002; 03/09/2003; 04/10/2005;06/02/2005; 05/09/2006; 02/09/2007; 03/01/2007   COLONOSCOPY  2008   Dr. Gala Romney: diverticulosis, hyperplastic polyp.   CORONARY ANGIOPLASTY  11/14/2012; 02/01/2015   CORONARY ANGIOPLASTY WITH STENT PLACEMENT  05/16/2012   "1; makes total ~ 4"   CORONARY STENT INTERVENTION N/A 06/06/2017   Procedure: CORONARY STENT INTERVENTION;  Surgeon: Belva Crome, MD;  Location: [place] [facility];  Service: Cardiovascular;  Laterality: N/A;   INSERTION OF MESH Bilateral 05/24/2016   Procedure: INSERTION OF MESH;  Surgeon: Coralie Keens, MD;  Location: [place];   Service: General;  Laterality: Bilateral;   LAPAROSCOPIC CHOLECYSTECTOMY     LAPAROSCOPIC INGUINAL HERNIA WITH UMBILICAL HERNIA Bilateral 05/24/2016   Procedure: BILATERAL LAPAROSCOPIC INGUINAL HERNIA REPAIR WITH MESH AND UMBILICAL HERNIA REPAIR WITH MESH;  Surgeon: Coralie Keens, MD;  Location: [place];  Service: General;  Laterality: Bilateral;   LEFT HEART CATH AND CORONARY ANGIOGRAPHY N/A 06/06/2017   Procedure: LEFT HEART CATH AND CORONARY ANGIOGRAPHY;  Surgeon: Belva Crome, MD;  Location: [place] [facility];  Service: Cardiovascular;  Laterality: N/A;   LEFT HEART CATHETERIZATION WITH CORONARY ANGIOGRAM N/A 12/22/2011   Procedure: LEFT HEART CATHETERIZATION WITH CORONARY ANGIOGRAM;  Surgeon: Troy Sine, MD;  Location: [facility] CATH LAB;  Service: Cardiovascular;  Laterality: N/A;   LEFT HEART CATHETERIZATION WITH CORONARY ANGIOGRAM Bilateral 05/16/2012   Procedure: LEFT HEART CATHETERIZATION WITH CORONARY ANGIOGRAM;  Surgeon: Lorretta Harp, MD;  Location: [facility] CATH LAB;  Service: Cardiovascular;  Laterality: Bilateral;   LEFT HEART CATHETERIZATION WITH CORONARY ANGIOGRAM N/A 11/14/2012   Procedure: LEFT HEART CATHETERIZATION WITH CORONARY ANGIOGRAM;  Surgeon: Troy Sine, MD;  Location: [facility] CATH LAB;  Service: Cardiovascular;  Laterality: N/A;   LEFT HEART CATHETERIZATION WITH CORONARY ANGIOGRAM N/A 09/01/2013   Procedure: LEFT HEART CATHETERIZATION WITH CORONARY ANGIOGRAM;  Surgeon: Leonie Man, MD;  Location: [facility] CATH LAB;  Service: Cardiovascular;  Laterality: N/A;   LUMBAR LAMINECTOMY  1972; 1973; 1985   x [place]  08/30/2009   No significant ischemia   OPEN REDUCTION INTERNAL FIXATION (ORIF) DISTAL RADIAL FRACTURE Right 12/10/2016   Procedure: OPEN REDUCTION INTERNAL FIXATION (ORIF) DISTAL RADIAL FRACTURE;  Surgeon: Iran Planas, MD;  Location: [place];  Service: Orthopedics;  Laterality: Right;   PERCUTANEOUS CORONARY  INTERVENTION-BALLOON ONLY  12/22/2011   Procedure: PERCUTANEOUS CORONARY INTERVENTION-BALLOON ONLY;  Surgeon: Troy Sine, MD;  Location: [facility] CATH LAB;  Service: Cardiovascular;;   PERCUTANEOUS CORONARY INTERVENTION-BALLOON ONLY  11/14/2012   Procedure: PERCUTANEOUS CORONARY INTERVENTION-BALLOON ONLY;  Surgeon: Troy Sine, MD;  Location: [facility] CATH LAB;  Service: Cardiovascular;;   PERCUTANEOUS CORONARY STENT INTERVENTION (PCI-S)  05/16/2012   Procedure: PERCUTANEOUS CORONARY STENT INTERVENTION (PCI-S);  Surgeon: Lorretta Harp, MD;  Location: [facility] CATH LAB;  Service: Cardiovascular;;   PERCUTANEOUS CORONARY STENT INTERVENTION (PCI-S)  09/01/2013   Procedure: PERCUTANEOUS CORONARY STENT INTERVENTION (PCI-S);  Surgeon: Leonie Man, MD;  Location: [facility] CATH LAB;  Service: Cardiovascular;;     reports that he has been smoking cigarettes. He has been smoking about 1.00 pack per day. He has never used smokeless tobacco. He reports that he does not drink alcohol or use drugs.  Allergies  Allergen Reactions   Ivp Dye [Iodinated Diagnostic Agents] Itching and Rash    Family History  Problem Relation Age of Onset   Heart disease Father    Colon cancer Brother        early 57s   Hypertension Mother    Heart disease Mother    Colon cancer Mother        died age 50. late 80s early 77s at diagnosis.  Lung cancer Mother    Prior to Admission medications   Medication Sig Start Date End Date Taking? Authorizing Provider  acarbose (PRECOSE) 100 MG tablet Take 1 tablet (100 mg total) by mouth 3 (three) times daily with meals. 06/06/19   Love, Ivan Anchors, PA-C  acetaminophen (TYLENOL) 650 MG CR tablet Take 650 mg by mouth every 8 (eight) hours as needed for pain.    [provider]  ALPRAZolam Duanne Moron) 0.5 MG tablet Take 1 tablet (0.5 mg total) by mouth 3 (three) times daily as needed for anxiety or sleep. 06/06/19   Love, Ivan Anchors, PA-C  apixaban (ELIQUIS) 5 MG TABS tablet Take 1  tablet (5 mg total) by mouth 2 (two) times daily. 06/06/19 07/06/19  Love, Ivan Anchors, PA-C  atorvastatin (LIPITOR) 80 MG tablet Take 1 tablet (80 mg total) by mouth daily at 6 PM. 06/06/19   Love, Ivan Anchors, PA-C  clopidogrel (PLAVIX) 75 MG tablet TAKE (1) TABLET BY MOUTH ONCE DAILY. 06/06/19   Love, Ivan Anchors, PA-C  collagenase (SANTYL) ointment Apply topically daily. And cover with damp to dry dressing daily. Change more frequently if soiled 06/06/19   Love, Ivan Anchors, PA-C  fish oil-omega-3 fatty acids 1000 MG capsule Take 1 g by mouth daily.     [provider]  folic acid (FOLVITE) 1 MG tablet Take 1 tablet (1 mg total) by mouth daily. 06/06/19   Love, Ivan Anchors, PA-C  furosemide (LASIX) 40 MG tablet Take 1 tablet (40 mg total) by mouth daily. 06/06/19 07/06/19  Love, Ivan Anchors, PA-C  gabapentin (NEURONTIN) 100 MG capsule Take 2 capsules (200 mg total) by mouth every 8 (eight) hours. 06/06/19   Love, Ivan Anchors, PA-C  glimepiride (AMARYL) 2 MG tablet Take 1 tablet (2 mg total) by mouth daily with breakfast. 06/06/19   Love, Ivan Anchors, PA-C  metoprolol tartrate (LOPRESSOR) 50 MG tablet Take 1 tablet (50 mg total) by mouth 2 (two) times daily. 06/06/19 07/06/19  Love, Ivan Anchors, PA-C  mometasone-formoterol (DULERA) 200-5 MCG/ACT AERO Inhale 2 puffs into the lungs 2 (two) times daily. 06/06/19   Love, Ivan Anchors, PA-C  Multiple Vitamin (MULTIVITAMIN WITH MINERALS) TABS tablet Take 1 tablet by mouth daily. 06/06/19   Love, Ivan Anchors, PA-C  nitroGLYCERIN (NITROSTAT) 0.4 MG SL tablet Place 1 tablet (0.4 mg total) under the tongue every 5 (five) minutes x 3 doses as needed for chest pain. 04/24/16   Croitoru, Mihai, MD  nystatin (MYCOSTATIN/NYSTOP) powder Apply topically 3 (three) times daily. 06/06/19   Love, Ivan Anchors, PA-C  potassium chloride SA (K-DUR) 20 MEQ tablet Take 1 tablet (20 mEq total) by mouth 2 (two) times daily. 06/06/19   Love, Ivan Anchors, PA-C  tamsulosin (FLOMAX) 0.4 MG CAPS capsule Take 2 capsules (0.8  mg total) by mouth daily after supper. 06/06/19 07/06/19  Bary Leriche, PA-C  vitamin C (VITAMIN C) 250 MG tablet Take 1 tablet (250 mg total) by mouth 2 (two) times daily. 06/06/19   Bary Leriche, PA-C    Physical Exam: Vitals:   07/03/19 2232 07/03/19 2233 07/03/19 2256 07/04/19 0100  BP: (!) 155/70  (!) 143/84 (!) 142/77  Pulse: 94  87 85  Resp: (!) 21  18 (!) 21  Temp: 98.8 F (37.1 C)     TempSrc: Oral     SpO2: 94%  94% 92%  Weight:  99.8 kg    Height:  6' (1.829 m)      Constitutional: Looks  chronically ill, but currently in NAD, calm, comfortable Eyes: PERRL, lids and conjunctivae normal ENMT: Mucous membranes are moist. Posterior pharynx clear of any exudate or lesions. Neck: normal, supple, no masses, no thyromegaly Respiratory: Mild rhonchi with decreased breath sounds bilaterally, no wheezing, no crackles. Normal respiratory effort. No accessory muscle use.  Cardiovascular: Regular rate and rhythm, no murmurs / rubs / gallops. No extremity edema. 2+ pedal pulses. No carotid bruits.  Abdomen: Nondistended, obese.  Bowel sounds positive.  Soft, no tenderness, no masses palpated. No hepatosplenomegaly. Musculoskeletal: no clubbing / cyanosis.  Good ROM, no contractures. Normal muscle tone.  Skin: Healing pressure ulcer. Neurologic: CN 2-12 grossly intact. Sensation intact, DTR normal. Strength 5/5 in all 4.  Psychiatric: Normal judgment and insight. Alert and oriented x 3. Normal mood.   Labs on Admission: I have personally reviewed following labs and imaging studies  CBC: Recent Labs  Lab 07/03/19 2313  WBC 5.2  NEUTROABS 3.7  HGB 9.8*  HCT 31.7*  MCV 92.2  PLT [id number]   Basic Metabolic Panel: Recent Labs  Lab 07/03/19 2313  NA 136  K 4.1  CL 106  CO2 23  GLUCOSE 143*  BUN 18  CREATININE 1.13  CALCIUM 8.8*   GFR: Estimated Creatinine Clearance: 78.7 mL/min (by C-G formula based on SCr of 1.13 mg/dL). Liver Function Tests: Recent Labs  Lab  07/03/19 2313  AST 21  ALT 27  ALKPHOS 91  BILITOT 0.7  PROT 7.7  ALBUMIN 3.1*   No results for input(s): LIPASE, AMYLASE in the last 168 hours. No results for input(s): AMMONIA in the last 168 hours. Coagulation Profile: No results for input(s): INR, PROTIME in the last 168 hours. Cardiac Enzymes: No results for input(s): CKTOTAL, CKMB, CKMBINDEX, TROPONINI in the last 168 hours. BNP (last 3 results) No results for input(s): PROBNP in the last 8760 hours. HbA1C: No results for input(s): HGBA1C in the last 72 hours. CBG: No results for input(s): GLUCAP in the last 168 hours. Lipid Profile: No results for input(s): CHOL, HDL, LDLCALC, TRIG, CHOLHDL, LDLDIRECT in the last 72 hours. Thyroid Function Tests: No results for input(s): TSH, T4TOTAL, FREET4, T3FREE, THYROIDAB in the last 72 hours. Anemia Panel: No results for input(s): VITAMINB12, FOLATE, FERRITIN, TIBC, IRON, RETICCTPCT in the last 72 hours. Urine analysis:    Component Value Date/Time   COLORURINE YELLOW 04/25/2019 0826   APPEARANCEUR HAZY (A) 04/25/2019 0826   LABSPEC 1.015 04/25/2019 0826   PHURINE 5.0 04/25/2019 0826   GLUCOSEU 150 (A) 04/25/2019 0826   HGBUR LARGE (A) 04/25/2019 0826   BILIRUBINUR NEGATIVE 04/25/2019 0826   KETONESUR NEGATIVE 04/25/2019 0826   PROTEINUR 30 (A) 04/25/2019 0826   UROBILINOGEN 0.2 12/22/2011 2130   NITRITE NEGATIVE 04/25/2019 0826   LEUKOCYTESUR NEGATIVE 04/25/2019 0826    Radiological Exams on Admission: Dg Chest 2 View  Result Date: 07/03/2019 CLINICAL DATA:  Choked on grits. EXAM: CHEST - 2 VIEW COMPARISON:  Chest radiograph 05/23/2019 CT 12/09/2016 FINDINGS: Chronic hyperinflation and emphysema. Improvement in bibasilar airspace disease from prior exam. Improvement in interstitial opacities, residual increase interstitial opacities and Kerley B-lines. Blunting of the left costophrenic angle most consistent with scarring. Unchanged heart size and mediastinal contours.  Coronary stent is visualized. No pneumothorax. IMPRESSION: 1. Improving bibasilar airspace disease from prior exam. 2. Improving interstitial opacities with Kerley B-lines consistent with pulmonary edema, however improved from September radiographs. 3. Blunting of the left costophrenic angle suggests scarring and left pleural thickening/small effusion. Electronically Signed  By: Keith Rake M.D.   On: 07/03/2019 23:19   Echo 05/14/2019   1. The left ventricle has low normal systolic function, with an ejection fraction of 50-55%. The cavity size was mildly dilated. moderate basal septal hypertrophy. Left ventricular diastolic Doppler parameters are consistent with restrictive filling.  Elevated left ventricular end-diastolic pressure. Inadequate for regional wall motion assessement due to poor acoutsical windows and inability to adequately visualize the endocardium. Recommend limited definity contrast study.  2. The right ventricle has normal systolc function. The cavity was normal. There is no increase in right ventricular wall thickness. Right ventricular systolic pressure could not be assessed.  3. Left atrial size was mildly dilated.  4. There is mild mitral annular calcification present. Mitral valve regurgitation is mild to moderate by color flow Doppler.  5. Aortic valve regurgitation was not assessed by color flow Doppler.  6. The aortic root and ascending aorta are normal in size and structure.  7. The inferior vena cava was dilated in size with >50% respiratory variability.  EKG: Independently reviewed.  Vent. rate 85 BPM PR interval * ms QRS duration 103 ms QT/QTc 374/445 ms P-R-T axes 59 -14 14 Sinus rhythm Paired ventricular premature complexes  Assessment/Plan Principal Problem:   Aspiration pneumonia (HCC) Admit to telemetry/inpatient. Continue supplemental oxygen. Bronchodilators as needed. Unasyn per pharmacy. Consider swallow evaluation if there is a  recurrence.  Active Problems:   COPD (chronic obstructive pulmonary disease) (HCC) Continue supplemental oxygen. Bronchodilators as needed.    Rheumatoid arthritis ([place]) No significant symptoms at this time. Continue acetaminophen as needed. He is also taking omega-3 fish rolls.    Hyperlipidemia Continue atorvastatin and Lovenox.    CAD S/P percutaneous coronary angioplasty Continue beta-blocker, statin and anticoagulation.    Essential hypertension Continue metoprolol 50 mg p.o. daily.  Monitor blood pressure and heart rate.    Diabetes ([place]) Carbohydrate modified diet. Continue arcabose and Amaryl. CBG before meals and bedtime.    GERD (gastroesophageal reflux disease) Oral Protonix.    Chronic combined systolic and diastolic heart failure (HCC) No signs of decompensation. Continue metoprolol, furosemide, sodium and fluid restriction.    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    Unspecified atrial fibrillation (HCC) CHA?DS?-VASc Score of at least 5. Continue apixaban. Continue metoprolol for rate control.    Anemia Monitor H&H.   DVT prophylaxis: On apixaban. Code Status: Full code. Family Communication:  Disposition Plan: Admit for IV antibiotics for 2 to 3 days. Consults called: Admission status: Inpatient/telemetry.   Reubin Milan MD [facility]  If 7PM-7AM, please contact night-coverage [email or web address]  07/04/2019, 2:39 AM   This document was prepared using Dragon voice recognition software and may contain some unintended transcription errors.

## 2019-07-04 NOTE — Evaluation (Signed)
Clinical/Bedside Swallow Evaluation Patient Details  Name: Robert [name] MRN: [id number] Date of Birth: Feb 23, 1951  Today's Date: 07/04/2019 Time: SLP Start Time (ACUTE ONLY): [id number] SLP Stop Time (ACUTE ONLY): 1257 SLP Time Calculation (min) (ACUTE ONLY): 22 min  Past Medical History:  Past Medical History:  Diagnosis Date  . Anxiety   . CHF (congestive heart failure) ([place])   . Collagen vascular disease ([place])   . COPD (chronic obstructive pulmonary disease) ([place])   . Coronary artery disease   . COVID-19   . Full dentures   . GERD (gastroesophageal reflux disease)    Rolaids as needed  . High cholesterol   . History of MI (myocardial infarction)   . Hypertension    med. dosage increased 04/2016; has been on BP med. x 10 yrs.  . Incarcerated umbilical hernia 0000000  . Inguinal hernia 05/2016   bilateral   . Insulin dependent diabetes mellitus   . Ischemic cardiomyopathy    a. 03/2015 EF 40-45% by LV gram.  . Left leg cellulitis 10/08/2016  . Rheumatoid arthritis(714.0)    Past Surgical History:  Past Surgical History:  Procedure Laterality Date  . CARDIAC CATHETERIZATION N/A 02/01/2015   Procedure: Left Heart Cath and Coronary Angiography;  Surgeon: Belva Crome, MD;  Location: [place] [facility];  Service: Cardiovascular;  Laterality: N/A;  . CARDIAC CATHETERIZATION N/A 03/22/2015   Procedure: Left Heart Cath and Coronary Angiography;  Surgeon: Leonie Man, MD;  Location: [place] [facility];  Service: Cardiovascular;  Laterality: N/A;  . CARDIAC CATHETERIZATION  09/05/2002; 03/09/2003; 04/10/2005;06/02/2005; 05/09/2006; 02/09/2007; 03/01/2007  . COLONOSCOPY  2008   Dr. Gala Romney: diverticulosis, hyperplastic polyp.  . CORONARY ANGIOPLASTY  11/14/2012; 02/01/2015  . CORONARY ANGIOPLASTY WITH STENT PLACEMENT  05/16/2012   "1; makes total ~ 4"  . CORONARY STENT INTERVENTION N/A 06/06/2017   Procedure: CORONARY STENT INTERVENTION;  Surgeon: Belva Crome, MD;  Location: [place] [facility];  Service: Cardiovascular;  Laterality: N/A;  . INSERTION OF MESH Bilateral 05/24/2016   Procedure: INSERTION OF MESH;  Surgeon: Coralie Keens, MD;  Location: [place];  Service: General;  Laterality: Bilateral;  . LAPAROSCOPIC CHOLECYSTECTOMY    . LAPAROSCOPIC INGUINAL HERNIA WITH UMBILICAL HERNIA Bilateral 05/24/2016   Procedure: BILATERAL LAPAROSCOPIC INGUINAL HERNIA REPAIR WITH MESH AND UMBILICAL HERNIA REPAIR WITH MESH;  Surgeon: Coralie Keens, MD;  Location: [place];  Service: General;  Laterality: Bilateral;  . LEFT HEART CATH AND CORONARY ANGIOGRAPHY N/A 06/06/2017   Procedure: LEFT HEART CATH AND CORONARY ANGIOGRAPHY;  Surgeon: Belva Crome, MD;  Location: [place] [facility];  Service: Cardiovascular;  Laterality: N/A;  . LEFT HEART CATHETERIZATION WITH CORONARY ANGIOGRAM N/A 12/22/2011   Procedure: LEFT HEART CATHETERIZATION WITH CORONARY ANGIOGRAM;  Surgeon: Troy Sine, MD;  Location: [facility] CATH LAB;  Service: Cardiovascular;  Laterality: N/A;  . LEFT HEART CATHETERIZATION WITH CORONARY ANGIOGRAM Bilateral 05/16/2012   Procedure: LEFT HEART CATHETERIZATION WITH CORONARY ANGIOGRAM;  Surgeon: Lorretta Harp, MD;  Location: [facility] CATH LAB;  Service: Cardiovascular;  Laterality: Bilateral;  . LEFT HEART CATHETERIZATION WITH CORONARY ANGIOGRAM N/A 11/14/2012   Procedure: LEFT HEART CATHETERIZATION WITH CORONARY ANGIOGRAM;  Surgeon: Troy Sine, MD;  Location: [facility] CATH LAB;  Service: Cardiovascular;  Laterality: N/A;  . LEFT HEART CATHETERIZATION WITH CORONARY ANGIOGRAM N/A 09/01/2013   Procedure: LEFT HEART CATHETERIZATION WITH CORONARY ANGIOGRAM;  Surgeon: Leonie Man, MD;  Location: [facility] CATH LAB;  Service: Cardiovascular;  Laterality: N/A;  . [place]; 1973; 1985   x 3  . NM MYOCAR PERF WALL MOTION  08/30/2009   No significant ischemia  . OPEN REDUCTION INTERNAL FIXATION (ORIF) DISTAL RADIAL FRACTURE Right 12/10/2016   Procedure:  OPEN REDUCTION INTERNAL FIXATION (ORIF) DISTAL RADIAL FRACTURE;  Surgeon: Iran Planas, MD;  Location: [place];  Service: Orthopedics;  Laterality: Right;  . PERCUTANEOUS CORONARY INTERVENTION-BALLOON ONLY  12/22/2011   Procedure: PERCUTANEOUS CORONARY INTERVENTION-BALLOON ONLY;  Surgeon: Troy Sine, MD;  Location: [facility] CATH LAB;  Service: Cardiovascular;;  . PERCUTANEOUS CORONARY INTERVENTION-BALLOON ONLY  11/14/2012   Procedure: PERCUTANEOUS CORONARY INTERVENTION-BALLOON ONLY;  Surgeon: Troy Sine, MD;  Location: [facility] CATH LAB;  Service: Cardiovascular;;  . PERCUTANEOUS CORONARY STENT INTERVENTION (PCI-S)  05/16/2012   Procedure: PERCUTANEOUS CORONARY STENT INTERVENTION (PCI-S);  Surgeon: Lorretta Harp, MD;  Location: [facility] CATH LAB;  Service: Cardiovascular;;  . PERCUTANEOUS CORONARY STENT INTERVENTION (PCI-S)  09/01/2013   Procedure: PERCUTANEOUS CORONARY STENT INTERVENTION (PCI-S);  Surgeon: Leonie Man, MD;  Location: [facility] CATH LAB;  Service: Cardiovascular;;   HPI:  Robert [name] is a 68 year old male with history of COPD, CAD, diabetes mellitus, ischemic cardiomyopathy, RA, presented to the hospital and was admitted on 04/11/2019 with shortness of breath, he was found to be significantly hypoxic, tested positive for COVID-19, was intubated and transferred to [facility] for further management. Intubated 8/1-8/2, reintubated 8/3-8/22. Pt experienced subsequent dysphagia and was initially on D1/puree and HTL but after recieving skilled ST in CIR and as of last treatment session 06/06/2019 was discharged from skilled ST on D3/mech soft and thin liquids. Pt experience a choking episode on 07/03/2019 while eating grits and was preemptively started on ampicillin/sulbactam in the ED for suspected aspiration PNA.   Assessment / Plan / Recommendation Clinical Impression  Pt was administered clinical swallowing evaluation while seated upright on the edge of the bed. Pt reports this "choking episode" was an  isolated incident and that this is the only time it has happened since d/c from recent hospitalization. Pt consumed thin liquids, regular textures and ice chips without overt s/sx of aspiration. Pt reported he continues to utilize a chin tuck when drinking thin liquids per recommendation from ST and that he "takes his time". Pt was eating grits when the episode occured and SLP provided education that grits have small pieces that can cause difficulty for people with heightened aspiration risk similarly to corn bread. Reviewed aspiration precautions and recommendation for Pt to avoid textures that have small pieces that are not easily cohesive and moist. Recommend initiate D3/mechanical soft diet with thin liquids. There are no further ST needs noted at this time. ST to sign off, thank you for this referral. SLP Visit Diagnosis: Dysphagia, unspecified (R13.10)    Aspiration Risk  Mild aspiration risk;Moderate aspiration risk    Diet Recommendation Dysphagia 3 (Mech soft);Thin liquid   Liquid Administration via: Cup Medication Administration: Whole meds with liquid Supervision: Patient able to self feed;Intermittent supervision to cue for compensatory strategies Compensations: Minimize environmental distractions;Slow rate;Small sips/bites;Follow solids with liquid Postural Changes: Seated upright at 90 degrees;Remain upright for at least 30 minutes after po intake    Other  Recommendations Oral Care Recommendations: Oral care BID     Swallow Study   General Date of Onset: 07/03/19 HPI: Robert [name] is a 68 year old male with history of COPD, CAD, diabetes mellitus, ischemic cardiomyopathy, RA, presented to the hospital and was admitted on 04/11/2019 with shortness  of breath, he was found to be significantly hypoxic, tested positive for COVID-19, was intubated and transferred to [facility] for further management. Intubated 8/1-8/2, reintubated 8/3-8/22. Pt experienced subsequent dysphagia and was initially  on D1/puree and HTL but after recieving skilled ST in CIR and as of last treatment session 06/06/2019 was discharged from skilled ST on D3/mech soft and thin liquids. Pt experience a choking episode on 07/03/2019 while eating grits and was preemptively started on ampicillin/sulbactam in the ED for suspected aspiration PNA. Type of Study: Bedside Swallow Evaluation Previous Swallow Assessment: ST from 04/2019, most recent MBS 06/05/2019 Diet Prior to this Study: Regular;Thin liquids Temperature Spikes Noted: No Respiratory Status: Nasal cannula History of Recent Intubation: No Behavior/Cognition: Alert;Cooperative;Pleasant mood Oral Cavity Assessment: Within Functional Limits Oral Care Completed by SLP: Recent completion by staff Oral Cavity - Dentition: Dentures, top;Dentures, bottom Vision: Functional for self-feeding Self-Feeding Abilities: Able to feed self Patient Positioning: Upright in bed Baseline Vocal Quality: Normal Volitional Cough: Strong Volitional Swallow: Able to elicit    Oral/Motor/Sensory Function Overall Oral Motor/Sensory Function: Within functional limits   Ice Chips Ice chips: Within functional limits   Thin Liquid Thin Liquid: Within functional limits    Nectar Thick Nectar Thick Liquid: Not tested   Honey Thick Honey Thick Liquid: Not tested   Puree Puree: Within functional limits   Solid     H. Roddie Mc, CCC-SLP Speech Language Pathologist  Solid: Within functional limits      Wende Bushy 07/04/2019,1:12 PM

## 2019-07-04 NOTE — ED Notes (Signed)
Pt transferred to [facility] Perf (V/Q scan)

## 2019-07-05 DIAGNOSIS — I4891 Unspecified atrial fibrillation: Secondary | ICD-10-CM

## 2019-07-05 DIAGNOSIS — J449 Chronic obstructive pulmonary disease, unspecified: Secondary | ICD-10-CM | POA: Diagnosis not present

## 2019-07-05 DIAGNOSIS — L89302 Pressure ulcer of unspecified buttock, stage 2: Secondary | ICD-10-CM | POA: Diagnosis not present

## 2019-07-05 DIAGNOSIS — E1122 Type 2 diabetes mellitus with diabetic chronic kidney disease: Secondary | ICD-10-CM | POA: Diagnosis not present

## 2019-07-05 DIAGNOSIS — Z794 Long term (current) use of insulin: Secondary | ICD-10-CM | POA: Diagnosis not present

## 2019-07-05 DIAGNOSIS — I480 Paroxysmal atrial fibrillation: Secondary | ICD-10-CM | POA: Diagnosis not present

## 2019-07-05 DIAGNOSIS — I5042 Chronic combined systolic (congestive) and diastolic (congestive) heart failure: Secondary | ICD-10-CM | POA: Diagnosis not present

## 2019-07-05 DIAGNOSIS — I10 Essential (primary) hypertension: Secondary | ICD-10-CM

## 2019-07-05 DIAGNOSIS — I5043 Acute on chronic combined systolic (congestive) and diastolic (congestive) heart failure: Secondary | ICD-10-CM

## 2019-07-05 LAB — COMPREHENSIVE METABOLIC PANEL
ALT: 26 U/L (ref 0–44)
AST: 21 U/L (ref 15–41)
Albumin: 3.1 g/dL — ABNORMAL LOW (ref 3.5–5.0)
Alkaline Phosphatase: 87 U/L (ref 38–126)
Anion gap: 11 (ref 5–15)
BUN: 20 mg/dL (ref 8–23)
CO2: 27 mmol/L (ref 22–32)
Calcium: 9.2 mg/dL (ref 8.9–10.3)
Chloride: 101 mmol/L (ref 98–111)
Creatinine, Ser: 1.1 mg/dL (ref 0.61–1.24)
GFR calc Af Amer: >60 mL/min (ref >60)
GFR calc non Af Amer: >60 mL/min (ref >60)
Glucose, Bld: 138 mg/dL — ABNORMAL HIGH (ref 70–99)
Potassium: 4.2 mmol/L (ref 3.5–5.1)
Sodium: 139 mmol/L (ref 135–145)
Total Bilirubin: 0.5 mg/dL (ref 0.3–1.2)
Total Protein: 7.3 g/dL (ref 6.5–8.1)

## 2019-07-05 LAB — CBC WITH DIFFERENTIAL/PLATELET
Abs Immature Granulocytes: 0.04 10*3/uL (ref 0.00–0.07)
Basophils Absolute: 0.1 10*3/uL (ref 0.0–0.1)
Basophils Relative: 1 %
Eosinophils Absolute: 0.3 10*3/uL (ref 0.0–0.5)
Eosinophils Relative: 5 %
HCT: 33.6 % — ABNORMAL LOW (ref 39.0–52.0)
Hemoglobin: 10.6 g/dL — ABNORMAL LOW (ref 13.0–17.0)
Immature Granulocytes: 1 %
Lymphocytes Relative: 18 %
Lymphs Abs: 1.1 10*3/uL (ref 0.7–4.0)
MCH: 28.6 pg (ref 26.0–34.0)
MCHC: 31.5 g/dL (ref 30.0–36.0)
MCV: 90.6 fL (ref 80.0–100.0)
Monocytes Absolute: 0.5 10*3/uL (ref 0.1–1.0)
Monocytes Relative: 9 %
Neutro Abs: 3.9 10*3/uL (ref 1.7–7.7)
Neutrophils Relative %: 66 %
Platelets: 187 10*3/uL (ref 150–400)
RBC: 3.71 MIL/uL — ABNORMAL LOW (ref 4.22–5.81)
RDW: 14.9 % (ref 11.5–15.5)
WBC: 5.9 10*3/uL (ref 4.0–10.5)
nRBC: 0 % (ref 0.0–0.2)

## 2019-07-05 LAB — GLUCOSE, CAPILLARY
Glucose-Capillary: 144 mg/dL — ABNORMAL HIGH (ref 70–99)
Glucose-Capillary: 308 mg/dL — ABNORMAL HIGH (ref 70–99)
Glucose-Capillary: 261 mg/dL — ABNORMAL HIGH (ref 70–99)
Glucose-Capillary: 397 mg/dL — ABNORMAL HIGH (ref 70–99)

## 2019-07-05 LAB — MAGNESIUM: Magnesium: 1.8 mg/dL (ref 1.7–2.4)

## 2019-07-05 MED ORDER — COLLAGENASE 250 UNIT/GM EX OINT
TOPICAL_OINTMENT | Freq: Every day | CUTANEOUS | Status: DC
  Administered 2019-07-06 – 2019-07-07 (×2): via TOPICAL
  Filled 2019-07-05: qty 30

## 2019-07-05 MED ORDER — FUROSEMIDE 10 MG/ML IJ SOLN
40.0000 mg | Freq: Once | INTRAMUSCULAR | Status: AC
  Administered 2019-07-05: 40 mg via INTRAVENOUS
  Filled 2019-07-05: qty 4

## 2019-07-05 MED ORDER — INSULIN ASPART 100 UNIT/ML ~~LOC~~ SOLN: 0.0000 [IU] | Freq: Three times a day (TID) | SUBCUTANEOUS | Status: DC

## 2019-07-05 MED ORDER — APIXABAN 5 MG PO TABS
5.0000 mg | ORAL_TABLET | Freq: Two times a day (BID) | ORAL | Status: DC
  Administered 2019-07-05 – 2019-07-07 (×5): 5 mg via ORAL
  Filled 2019-07-05 (×5): qty 1

## 2019-07-05 MED ORDER — MOMETASONE FURO-FORMOTEROL FUM 200-5 MCG/ACT IN AERO
2.0000 | INHALATION_SPRAY | Freq: Two times a day (BID) | RESPIRATORY_TRACT | Status: DC
  Administered 2019-07-05 – 2019-07-07 (×4): 2 via RESPIRATORY_TRACT
  Filled 2019-07-05: qty 8.8

## 2019-07-05 MED ORDER — FUROSEMIDE 40 MG PO TABS: 40.0000 mg | ORAL_TABLET | Freq: Every day | ORAL | Status: DC

## 2019-07-05 MED ORDER — ATORVASTATIN CALCIUM 40 MG PO TABS
80.0000 mg | ORAL_TABLET | Freq: Every day | ORAL | Status: DC
  Administered 2019-07-05 – 2019-07-06 (×2): 80 mg via ORAL
  Filled 2019-07-05 (×3): qty 2

## 2019-07-05 MED ORDER — ONDANSETRON HCL 4 MG/2ML IJ SOLN: 4.0000 mg | Freq: Four times a day (QID) | INTRAMUSCULAR | Status: DC | PRN

## 2019-07-05 MED ORDER — FOLIC ACID 1 MG PO TABS
1.0000 mg | ORAL_TABLET | Freq: Every day | ORAL | Status: DC
  Administered 2019-07-05 – 2019-07-07 (×3): 1 mg via ORAL
  Filled 2019-07-05 (×3): qty 1

## 2019-07-05 MED ORDER — INSULIN ASPART 100 UNIT/ML ~~LOC~~ SOLN
0.0000 [IU] | Freq: Every day | SUBCUTANEOUS | Status: DC
  Administered 2019-07-05: 5 [IU] via SUBCUTANEOUS

## 2019-07-05 MED ORDER — METOPROLOL TARTRATE 25 MG PO TABS
25.0000 mg | ORAL_TABLET | Freq: Two times a day (BID) | ORAL | Status: DC
  Administered 2019-07-05 – 2019-07-07 (×5): 25 mg via ORAL
  Filled 2019-07-05 (×5): qty 1

## 2019-07-05 NOTE — Progress Notes (Signed)
PROGRESS NOTE  Robert [name] [id number] DOB: June 19, 1951 DOA: 07/03/2019 PCP: Redmond School, MD  Brief History:  68 year old male with a history of COPD, coronary disease, paroxysmal atrial fibrillation on apixaban, hypertension, hyperlipidemia, diabetes mellitus type 2, CKD stage II presenting with a choking episode after eating some grits on 07/03/2019.  This resulted in a vigorous cough and spitting up some phlegm.  He had some chest burning at that time which was relieved with Rolaids.  Because of his shortness of breath and choking, he presented for further evaluation.  In the past week, the patient had denied any fevers, chills, chest pain, shortness breath, nausea, vomiting, or choking episodes.  He states that his lower extremity edema is about the same as usual.  He denied any worsening orthopnea or PND.  However he states that he is missed taking his Lasix over the past 2 to 3 days prior to admission.  In addition, the patient states that he has gained at least 30 pounds over the past week.  His last home weight was 220 pounds. Notably, the patient had a recent prolonged hospitalization from 04/11/2019 through 05/22/2019 when he suffered from [id number] pneumonia complicated by sepsis.  His hospitalization was complicated by respiratory failure requiring mechanical ventilation.  The patient developed MRSA pneumonia and aspiration.  He required reintubation after initial extubation.  His hospitalization was further complicated by acute on chronic systolic and diastolic CHF, paroxysmal atrial fibrillation, COPD exacerbation, and urinary retention.  He was subsequently discharged to CIR where he stayed from 05/22/2019 through 06/07/2019.  He was discharged home on a dysphagia 3 diet In the emergency department, the patient was afebrile and hemodynamically stable.  With ambulation, the patient did desaturate to the 70% range.  As result, admission was requested for further evaluation.  BMP,  LFTs, and CBC were essentially unremarkable.  Troponin was 9x2.  EKG showed sinus rhythm with nonspecific T wave changes.  Chest x-ray showed interstitial prominence with improvement of his bibasilar opacities.  Assessment/Plan: Acute respiratory failure with hypoxia -Secondary to pulmonary edema>>> aspiration pneumonitis in the setting of underlying COPD -Presently stable on 2 L nasal cannula>>>RA -Wean oxygen for saturation greater 92% -Personally reviewed chest x-ray--increased interstitial markings without consolidation  Acute on chronic systolic and diastolic CHF -[id number] echo EF 50-55%, normal RV -06/07/2017 echo EF 40-45% -Continue furosemide 40 mg IV bid -Daily weights -Accurate I's and O's--NEG 2.4L  COVID-19 Positive -patient is not infectious at this point in time -he had previous positive COVID tests 04/11/19 and 05/07/19 -he was treated with disease modifying agents -he is improving clinically not on any treatment -remain in droplet isolation  Aspiration pneumonitis -Speech therapy--dysphagia 3 diet with thin liquids -Patient continue IV Unasyn  COPD -No wheezing on exam -Continue bronchodilators  Paroxysmal atrial fibrillation -Presently in sinus rhythm -Continue apixaban  -continue metoprolol tartrate  Diabetes mellitus type 2 -1023/2020 hemoglobin A1c 6.7 -Holding Amaryl and Precose -NovoLog sliding scale  CKD stage II -Baseline creatinine 1.1-1.3 -Monitor with diuresis  Hyperlipidemia -Continue statin  Anxiety -Continue home dose of alprazolam  Essential hypertension -Continue metoprolol tartrate  Coronary artery disease -No chest pain presently -Personally reviewed EKG--sinus rhythm, nonspecific T wave changes  Gluteal stage 2 ulcer -present on admission -local care     Disposition Plan:   Home 10/25 or 1026 if stable Family Communication:  No Family at bedside  Consultants:  none  Code Status:  FULL   DVT  Prophylaxis:  apixaban   Procedures: As Listed in Progress Note Above  Antibiotics: Unasyn 10/23>>>   Subjective: Patient denies fevers, chills, headache, chest pain, dyspnea, nausea, vomiting, diarrhea, abdominal pain, dysuria, hematuria, hematochezia, and melena.   Objective: Vitals:   07/05/19 0409 07/05/19 0842 07/05/19 1000 07/05/19 1020  BP: (!) 120/56     Pulse: 75  84 82  Resp: 17  18 18   Temp: 98.2 F (36.8 C)     TempSrc:      SpO2: 96% 97% 99% 96%  Weight:      Height:        Intake/Output Summary (Last 24 hours) at 07/05/2019 1158 Last data filed at 07/05/2019 1000 Gross per 24 hour  Intake 580 ml  Output 1750 ml  Net -1170 ml   Weight change:  Exam:   General:  Pt is alert, follows commands appropriately, not in acute distress  HEENT: No icterus, No thrush, No neck mass, [place]/AT  Cardiovascular: RRR, S1/S2, no rubs, no gallops  Respiratory: fine bibasilar crackles, no wheeze  Abdomen: Soft/+BS, non tender, non distended, no guarding  Extremities: No edema, No lymphangitis, No petechiae, No rashes, no synovitis   Data Reviewed: I have personally reviewed following labs and imaging studies Basic Metabolic Panel: Recent Labs  Lab 07/03/19 2313 07/05/19 0618  NA 136 139  K 4.1 4.2  CL 106 101  CO2 23 27  GLUCOSE 143* 138*  BUN 18 20  CREATININE 1.13 1.10  CALCIUM 8.8* 9.2  MG  --  1.8   Liver Function Tests: Recent Labs  Lab 07/03/19 2313 07/05/19 0618  AST 21 21  ALT 27 26  ALKPHOS 91 87  BILITOT 0.7 0.5  PROT 7.7 7.3  ALBUMIN 3.1* 3.1*   No results for input(s): LIPASE, AMYLASE in the last 168 hours. No results for input(s): AMMONIA in the last 168 hours. Coagulation Profile: No results for input(s): INR, PROTIME in the last 168 hours. CBC: Recent Labs  Lab 07/03/19 2313 07/05/19 0618  WBC 5.2 5.9  NEUTROABS 3.7 3.9  HGB 9.8* 10.6*  HCT 31.7* 33.6*  MCV 92.2 90.6  PLT 183 187   Cardiac Enzymes: No results  for input(s): CKTOTAL, CKMB, CKMBINDEX, TROPONINI in the last 168 hours. BNP: Invalid input(s): POCBNP CBG: Recent Labs  Lab 07/04/19 1325 07/04/19 1609 07/04/19 2042 07/05/19 0727 07/05/19 1111  GLUCAP 175* 147* 271* 144* 308*   HbA1C: Recent Labs    07/03/19 2313  HGBA1C 6.7*   Urine analysis:    Component Value Date/Time   COLORURINE YELLOW 04/25/2019 0826   APPEARANCEUR HAZY (A) 04/25/2019 0826   LABSPEC 1.015 04/25/2019 0826   PHURINE 5.0 04/25/2019 0826   GLUCOSEU 150 (A) 04/25/2019 0826   HGBUR LARGE (A) 04/25/2019 0826   BILIRUBINUR NEGATIVE 04/25/2019 0826   KETONESUR NEGATIVE 04/25/2019 0826   PROTEINUR 30 (A) 04/25/2019 0826   UROBILINOGEN 0.2 12/22/2011 2130   NITRITE NEGATIVE 04/25/2019 0826   LEUKOCYTESUR NEGATIVE 04/25/2019 0826   Sepsis Labs: @LABRCNTIP (procalcitonin:4,lacticidven:4) ) Recent Results (from the past 240 hour(s))  SARS CORONAVIRUS 2 ([place] 6-24 HRS) Nasopharyngeal Nasopharyngeal Swab     Status: Abnormal   Collection Time: 07/04/19 12:29 AM   Specimen: Nasopharyngeal Swab  Result Value Ref Range Status   SARS Coronavirus 2 POSITIVE (A) NEGATIVE Final    Comment: RESULT CALLED TO, READ BACK BY AND VERIFIED WITH: M.TAYLOR RN 1807 07/04/2019 MCCORMICK K (NOTE) SARS-CoV-2 target nucleic acids are DETECTED. The SARS-CoV-2 RNA is generally  detectable in upper and lower respiratory specimens during the acute phase of infection. Positive results are indicative of active infection with SARS-CoV-2. Clinical  correlation with patient history and other diagnostic information is necessary to determine patient infection status. Positive results do  not rule out bacterial infection or co-infection with other viruses. The expected result is Negative. Fact Sheet for Patients: SugarRoll.be Fact Sheet for Healthcare Providers: https://www.woods-mathews.com/ This test is not yet approved or cleared by the  [place] FDA and  has been authorized for detection and/or diagnosis of SARS-CoV-2 by FDA under an Emergency Use Authorization (EUA). This EUA will remain  in effect (meaning this test can be used) f or the duration of the COVID-19 declaration under Section 564(b)(1) of the Act, 21 U.S.C. section 360bbb-3(b)(1), unless the authorization is terminated or revoked sooner. Performed at [facility], [place] [address], [place], [place] [address]   Blood culture (routine x 2)     Status: None (Preliminary result)   Collection Time: 07/04/19 12:50 AM   Specimen: BLOOD  Result Value Ref Range Status   Specimen Description BLOOD RIGHT ANTECUBITAL  Final   Special Requests   Final    BOTTLES DRAWN AEROBIC AND ANAEROBIC RIGHT ANTECUBITAL   Culture   Final    NO GROWTH 1 DAY Performed at [facility], [address] [place], [place] [address]    Report Status PENDING  Incomplete  Blood culture (routine x 2)     Status: None (Preliminary result)   Collection Time: 07/04/19 12:56 AM   Specimen: BLOOD  Result Value Ref Range Status   Specimen Description BLOOD LEFT ANTECUBITAL  Final   Special Requests   Final    BOTTLES DRAWN AEROBIC AND ANAEROBIC Blood Culture adequate volume   Culture   Final    NO GROWTH 1 DAY Performed at [facility], [address], [place],  [address]    Report Status PENDING  Incomplete     Scheduled Meds:  apixaban  5 mg Oral BID   atorvastatin  80 mg Oral [id number]   folic acid  1 mg Oral Daily   furosemide  40 mg Intravenous Daily   furosemide  40 mg Intravenous Once   [START ON 07/06/2019] furosemide  40 mg Oral Daily   insulin aspart  0-9 Units Subcutaneous TID WC   ipratropium-albuterol  3 mL Nebulization Q8H   metoprolol tartrate  25 mg Oral BID   Continuous Infusions:  ampicillin-sulbactam (UNASYN) IV 3 g (07/05/19 0625)    Procedures/Studies: Dg Chest 2 View  Result Date: 07/03/2019 CLINICAL DATA:  Choked on grits. EXAM:  CHEST - 2 VIEW COMPARISON:  Chest radiograph 05/23/2019 CT 12/09/2016 FINDINGS: Chronic hyperinflation and emphysema. Improvement in bibasilar airspace disease from prior exam. Improvement in interstitial opacities, residual increase interstitial opacities and Kerley B-lines. Blunting of the left costophrenic angle most consistent with scarring. Unchanged heart size and mediastinal contours. Coronary stent is visualized. No pneumothorax. IMPRESSION: 1. Improving bibasilar airspace disease from prior exam. 2. Improving interstitial opacities with Kerley B-lines consistent with pulmonary edema, however improved from September radiographs. 3. Blunting of the left costophrenic angle suggests scarring and left pleural thickening/small effusion. Electronically Signed   By: Keith Rake M.D.   On: 07/03/2019 23:19   Nm Pulmonary Perfusion  Result Date: 07/04/2019 CLINICAL DATA:  Choked on food yesterday. Persistent chest pain and shortness of breath. EXAM: NUCLEAR MEDICINE PERFUSION LUNG SCAN TECHNIQUE: Perfusion images were obtained in multiple projections after intravenous  injection of radiopharmaceutical. Ventilation scans intentionally deferred if perfusion scan and chest x-ray adequate for interpretation during COVID 19 epidemic. RADIOPHARMACEUTICALS:  1.65 mCi Tc-31m MAA IV COMPARISON:  Chest x-ray 07/03/2019 FINDINGS: No segmental or subsegmental perfusion defects are identified to suggest pulmonary embolism. IMPRESSION: Negative perfusion lung scan for pulmonary embolism. Electronically Signed   By: Marijo Sanes M.D.   On: 07/04/2019 07:35   Dg Swallowing Func-speech Pathology  Result Date: 06/05/2019 Objective Swallowing Evaluation: Type of Study: MBS-Modified Barium Swallow Study  Patient Details Name: Robert [name] MRN: [id number] Date of Birth: 09-07-1951 Today's Date: 06/05/2019 SLP Time Calculation (min) (ACUTE ONLY): 18 min Past Medical History: Past Medical History: Diagnosis Date  Anxiety    COPD (chronic obstructive pulmonary disease) ([place])   Coronary artery disease   Full dentures   GERD (gastroesophageal reflux disease)   Rolaids as needed  High cholesterol   History of MI (myocardial infarction)   Hypertension   med. dosage increased 04/2016; has been on BP med. x 10 yrs.  Incarcerated umbilical hernia 0000000  Inguinal hernia 05/2016  bilateral   Insulin dependent diabetes mellitus ([place])   Ischemic cardiomyopathy   a. 03/2015 EF 40-45% by LV gram.  Left leg cellulitis 10/08/2016  Rheumatoid arthritis(714.0)  Past Surgical History: Past Surgical History: Procedure Laterality Date  CARDIAC CATHETERIZATION N/A 02/01/2015  Procedure: Left Heart Cath and Coronary Angiography;  Surgeon: Belva Crome, MD;  Location: [place] [facility];  Service: Cardiovascular;  Laterality: N/A;  CARDIAC CATHETERIZATION N/A 03/22/2015  Procedure: Left Heart Cath and Coronary Angiography;  Surgeon: Leonie Man, MD;  Location: [place] [facility];  Service: Cardiovascular;  Laterality: N/A;  CARDIAC CATHETERIZATION  09/05/2002; 03/09/2003; 04/10/2005;06/02/2005; 05/09/2006; 02/09/2007; 03/01/2007  COLONOSCOPY  2008  Dr. Gala Romney: diverticulosis, hyperplastic polyp.  CORONARY ANGIOPLASTY  11/14/2012; 02/01/2015  CORONARY ANGIOPLASTY WITH STENT PLACEMENT  05/16/2012  "1; makes total ~ 4"  CORONARY STENT INTERVENTION N/A 06/06/2017  Procedure: CORONARY STENT INTERVENTION;  Surgeon: Belva Crome, MD;  Location: [place] [facility];  Service: Cardiovascular;  Laterality: N/A;  INSERTION OF MESH Bilateral 05/24/2016  Procedure: INSERTION OF MESH;  Surgeon: Coralie Keens, MD;  Location: [place];  Service: General;  Laterality: Bilateral;  LAPAROSCOPIC CHOLECYSTECTOMY    LAPAROSCOPIC INGUINAL HERNIA WITH UMBILICAL HERNIA Bilateral 05/24/2016  Procedure: BILATERAL LAPAROSCOPIC INGUINAL HERNIA REPAIR WITH MESH AND UMBILICAL HERNIA REPAIR WITH MESH;  Surgeon: Coralie Keens, MD;  Location: [place];  Service: General;  Laterality: Bilateral;  LEFT HEART CATH AND CORONARY ANGIOGRAPHY N/A 06/06/2017  Procedure: LEFT HEART CATH AND CORONARY ANGIOGRAPHY;  Surgeon: Belva Crome, MD;  Location: [place] [facility];  Service: Cardiovascular;  Laterality: N/A;  LEFT HEART CATHETERIZATION WITH CORONARY ANGIOGRAM N/A 12/22/2011  Procedure: LEFT HEART CATHETERIZATION WITH CORONARY ANGIOGRAM;  Surgeon: Troy Sine, MD;  Location: [facility] C[facility] LAB;  Service: Cardiovascular;  Laterality: N/A;  LEFT HEART CATHETERIZATION WITH CORONARY ANGIOGRAM Bilateral 05/16/2012  Procedure: LEFT HEART CATHETERIZATION WITH CORONARY ANGIOGRAM;  Surgeon: Lorretta Harp, MD;  Location: [facility] CATH LAB;  Service: Cardiovascular;  Laterality: Bilateral;  LEFT HEART CATHETERIZATION WITH CORONARY ANGIOGRAM N/A 11/14/2012  Procedure: LEFT HEART CATHETERIZATION WITH CORONARY ANGIOGRAM;  Surgeon: Troy Sine, MD;  Location: [facility] LAB;  Service: Cardiovascular;  Laterality: N/A;  LEFT HEART CATHETERIZATION WITH CORONARY ANGIOGRAM N/A 09/01/2013  Procedure: LEFT HEART CATHETERIZATION WITH CORONARY ANGIOGRAM;  Surgeon: Leonie Man, MD;  Location: [facility] CATH LAB;  Service: Cardiovascular;  Laterality: N/A;  LUMBAR LAMINECTOMY  1972; 1973; 1985  x [place]  08/30/2009  No significant ischemia  OPEN REDUCTION INTERNAL FIXATION (ORIF) DISTAL RADIAL FRACTURE Right 12/10/2016  Procedure: OPEN REDUCTION INTERNAL FIXATION (ORIF) DISTAL RADIAL FRACTURE;  Surgeon: Iran Planas, MD;  Location: [place];  Service: Orthopedics;  Laterality: Right;  PERCUTANEOUS CORONARY INTERVENTION-BALLOON ONLY  12/22/2011  Procedure: PERCUTANEOUS CORONARY INTERVENTION-BALLOON ONLY;  Surgeon: Troy Sine, MD;  Location: [facility] CATH LAB;  Service: Cardiovascular;;  PERCUTANEOUS CORONARY INTERVENTION-BALLOON ONLY  11/14/2012  Procedure: PERCUTANEOUS CORONARY INTERVENTION-BALLOON ONLY;  Surgeon: Troy Sine, MD;  Location: [facility] CATH LAB;  Service:  Cardiovascular;;  PERCUTANEOUS CORONARY STENT INTERVENTION (PCI-S)  05/16/2012  Procedure: PERCUTANEOUS CORONARY STENT INTERVENTION (PCI-S);  Surgeon: Lorretta Harp, MD;  Location: [facility] CATH LAB;  Service: Cardiovascular;;  PERCUTANEOUS CORONARY STENT INTERVENTION (PCI-S)  09/01/2013  Procedure: PERCUTANEOUS CORONARY STENT INTERVENTION (PCI-S);  Surgeon: Leonie Man, MD;  Location: [facility] CATH LAB;  Service: Cardiovascular;; HPI: See H&P  Subjective: Pt was pleasant and cooperative.  He was agreeable to MBS. Assessment / Plan / Recommendation CHL IP CLINICAL IMPRESSIONS 06/05/2019 Clinical Impression Pt presents with improved pharyngeal abilities over previous MBS. Pt continues to presents with mild pharyngeal dysphagia with swallow initiated at pyriform sinuses with varying levels of penetration during swallow of thin liquids. With cups sips, pt is able to protect airway but has some aspirates under vocal cords when consuming consecutive sips via straw. Pt with mild throat clear that was ineffective in clearing aspirates. Additionally, when consuming barium tablet with thin liquids, table lodged in vallecula with bolus of puree required to clear. Pt appears at reduced risk of aspiraiton when following general aspiraiton precautions. Recommend continuing dysphagia 3 with thin liquids via cup, medicine whole in puree and intermittent supervision. SLP Visit Diagnosis Dysphagia, pharyngeal phase (R13.13) Attention and concentration deficit following -- Frontal lobe and executive function deficit following -- Impact on safety and function Mild aspiration risk   CHL IP TREATMENT RECOMMENDATION 06/05/2019 Treatment Recommendations Therapy as outlined in treatment plan below   Prognosis 05/29/2019 Prognosis for Safe Diet Advancement Good Barriers to Reach Goals Time post onset;Severity of deficits Barriers/Prognosis Comment -- CHL IP DIET RECOMMENDATION 06/05/2019 SLP Diet Recommendations Dysphagia 3 (Mech soft) solids;Thin  liquid Liquid Administration via Cup Medication Administration Whole meds with puree Compensations Minimize environmental distractions;Slow rate;Small sips/bites Postural Changes Seated upright at 90 degrees   CHL IP OTHER RECOMMENDATIONS 06/05/2019 Recommended Consults -- Oral Care Recommendations Oral care BID Other Recommendations --   CHL IP FOLLOW UP RECOMMENDATIONS 06/05/2019 Follow up Recommendations Inpatient Rehab   CHL IP FREQUENCY AND DURATION 05/29/2019 Speech Therapy Frequency (ACUTE ONLY) min 5x/week Treatment Duration 2 weeks      CHL IP ORAL PHASE 06/05/2019 Oral Phase WFL Oral - Pudding Teaspoon -- Oral - Pudding Cup -- Oral - Honey Teaspoon -- Oral - Honey Cup -- Oral - Nectar Teaspoon -- Oral - Nectar Cup -- Oral - Nectar Straw -- Oral - Thin Teaspoon -- Oral - Thin Cup -- Oral - Thin Straw -- Oral - Puree -- Oral - Mech Soft -- Oral - Regular -- Oral - Multi-Consistency -- Oral - Pill -- Oral Phase - Comment --  CHL IP PHARYNGEAL PHASE 06/05/2019 Pharyngeal Phase Impaired Pharyngeal- Pudding Teaspoon -- Pharyngeal -- Pharyngeal- Pudding Cup -- Pharyngeal -- Pharyngeal- Honey Teaspoon -- Pharyngeal -- Pharyngeal- Honey Cup NT Pharyngeal -- Pharyngeal- Nectar Teaspoon NT Pharyngeal -- Pharyngeal- Nectar  Cup -- Pharyngeal -- Pharyngeal- Nectar Straw NT Pharyngeal -- Pharyngeal- Thin Teaspoon Delayed swallow initiation-pyriform sinuses;Reduced anterior laryngeal mobility;Reduced laryngeal elevation;Penetration/Aspiration during swallow Pharyngeal Material enters airway, remains ABOVE vocal cords then ejected out Pharyngeal- Thin Cup Delayed swallow initiation-pyriform sinuses;Reduced anterior laryngeal mobility;Reduced laryngeal elevation;Penetration/Aspiration during swallow;Pharyngeal residue - valleculae Pharyngeal Material enters airway, remains ABOVE vocal cords then ejected out Pharyngeal- Thin Straw Delayed swallow initiation-pyriform sinuses;Reduced anterior laryngeal mobility;Reduced laryngeal  elevation;Penetration/Aspiration during swallow Pharyngeal Material enters airway, CONTACTS cords and not ejected out Pharyngeal- Puree Delayed swallow initiation-vallecula Pharyngeal -- Pharyngeal- Mechanical Soft NT Pharyngeal -- Pharyngeal- Regular -- Pharyngeal -- Pharyngeal- Multi-consistency -- Pharyngeal -- Pharyngeal- Pill Delayed swallow initiation-vallecula;Reduced anterior laryngeal mobility;Reduced laryngeal elevation Pharyngeal -- Pharyngeal Comment barium tablet lodged in vallecula with silent aspiration of thin liquids, required puree to clear tablet  CHL IP CERVICAL ESOPHAGEAL PHASE 06/05/2019 Cervical Esophageal Phase WFL Pudding Teaspoon -- Pudding Cup -- Honey Teaspoon -- Honey Cup -- Nectar Teaspoon -- Nectar Cup -- Nectar Straw -- Thin Teaspoon -- Thin Cup -- Thin Straw -- Puree -- Mechanical Soft -- Regular -- Multi-consistency -- Pill -- Cervical Esophageal Comment -- Happi Overton 06/05/2019, 1:03 PM               Orson Eva, DO  [facility] Pager [phone]  If 7PM-7AM, please contact night-coverage [email or web address] Password [email or web address] 07/05/2019, 11:58 AM   LOS: 1 day

## 2019-07-06 LAB — CBC WITH DIFFERENTIAL/PLATELET
Abs Immature Granulocytes: 0.02 10*3/uL (ref 0.00–0.07)
Basophils Absolute: 0.1 10*3/uL (ref 0.0–0.1)
Basophils Relative: 1 %
Eosinophils Absolute: 0.3 10*3/uL (ref 0.0–0.5)
Eosinophils Relative: 5 %
HCT: 34.9 % — ABNORMAL LOW (ref 39.0–52.0)
Hemoglobin: 10.9 g/dL — ABNORMAL LOW (ref 13.0–17.0)
Immature Granulocytes: 0 %
Lymphocytes Relative: 26 %
Lymphs Abs: 1.4 10*3/uL (ref 0.7–4.0)
MCH: 28.1 pg (ref 26.0–34.0)
MCHC: 31.2 g/dL (ref 30.0–36.0)
MCV: 89.9 fL (ref 80.0–100.0)
Monocytes Absolute: 0.4 10*3/uL (ref 0.1–1.0)
Monocytes Relative: 8 %
Neutro Abs: 3.3 10*3/uL (ref 1.7–7.7)
Neutrophils Relative %: 60 %
Platelets: 187 10*3/uL (ref 150–400)
RBC: 3.88 MIL/uL — ABNORMAL LOW (ref 4.22–5.81)
RDW: 15 % (ref 11.5–15.5)
WBC: 5.5 10*3/uL (ref 4.0–10.5)
nRBC: 0 % (ref 0.0–0.2)

## 2019-07-06 LAB — BASIC METABOLIC PANEL
Anion gap: 9 (ref 5–15)
BUN: 20 mg/dL (ref 8–23)
CO2: 26 mmol/L (ref 22–32)
Calcium: 8.9 mg/dL (ref 8.9–10.3)
Chloride: 99 mmol/L (ref 98–111)
Creatinine, Ser: 1.07 mg/dL (ref 0.61–1.24)
GFR calc Af Amer: >60 mL/min (ref >60)
GFR calc non Af Amer: >60 mL/min (ref >60)
Glucose, Bld: 202 mg/dL — ABNORMAL HIGH (ref 70–99)
Potassium: 3.8 mmol/L (ref 3.5–5.1)
Sodium: 134 mmol/L — ABNORMAL LOW (ref 135–145)

## 2019-07-06 LAB — GLUCOSE, CAPILLARY
Glucose-Capillary: 199 mg/dL — ABNORMAL HIGH (ref 70–99)
Glucose-Capillary: 383 mg/dL — ABNORMAL HIGH (ref 70–99)
Glucose-Capillary: 199 mg/dL — ABNORMAL HIGH (ref 70–99)
Glucose-Capillary: 272 mg/dL — ABNORMAL HIGH (ref 70–99)

## 2019-07-06 LAB — MAGNESIUM: Magnesium: 1.8 mg/dL (ref 1.7–2.4)

## 2019-07-06 MED ORDER — INSULIN ASPART 100 UNIT/ML ~~LOC~~ SOLN
0.0000 [IU] | Freq: Every day | SUBCUTANEOUS | Status: DC
  Administered 2019-07-06: 3 [IU] via SUBCUTANEOUS

## 2019-07-06 MED ORDER — FUROSEMIDE 40 MG PO TABS
40.0000 mg | ORAL_TABLET | Freq: Every day | ORAL | Status: DC
  Administered 2019-07-07: 40 mg via ORAL
  Filled 2019-07-06: qty 1

## 2019-07-06 MED ORDER — FUROSEMIDE 40 MG PO TABS: 40.0000 mg | ORAL_TABLET | Freq: Every day | ORAL | Status: DC

## 2019-07-06 MED ORDER — FUROSEMIDE 10 MG/ML IJ SOLN
40.0000 mg | Freq: Two times a day (BID) | INTRAMUSCULAR | Status: AC
  Administered 2019-07-06: 40 mg via INTRAVENOUS
  Filled 2019-07-06 (×2): qty 4

## 2019-07-06 MED ORDER — INSULIN ASPART 100 UNIT/ML ~~LOC~~ SOLN
0.0000 [IU] | Freq: Three times a day (TID) | SUBCUTANEOUS | Status: DC
  Administered 2019-07-06: 3 [IU] via SUBCUTANEOUS
  Administered 2019-07-06: 15 [IU] via SUBCUTANEOUS
  Administered 2019-07-06: 3 [IU] via SUBCUTANEOUS
  Administered 2019-07-07: 5 [IU] via SUBCUTANEOUS
  Administered 2019-07-07: 11 [IU] via SUBCUTANEOUS

## 2019-07-06 MED ORDER — FUROSEMIDE 10 MG/ML IJ SOLN: 40.0000 mg | Freq: Every day | INTRAMUSCULAR | Status: DC

## 2019-07-06 MED ORDER — MAGNESIUM SULFATE 2 GM/50ML IV SOLN
2.0000 g | Freq: Once | INTRAVENOUS | Status: AC
  Administered 2019-07-06: 2 g via INTRAVENOUS
  Filled 2019-07-06: qty 50

## 2019-07-06 MED ORDER — INSULIN ASPART 100 UNIT/ML ~~LOC~~ SOLN
3.0000 [IU] | Freq: Three times a day (TID) | SUBCUTANEOUS | Status: DC
  Administered 2019-07-06 – 2019-07-07 (×5): 3 [IU] via SUBCUTANEOUS

## 2019-07-06 NOTE — Progress Notes (Signed)
PROGRESS NOTE  Robert [name] [id number] DOB: [birth year] DOA: 07/03/2019 PCP: Redmond School, MD  Brief History: 68 year old male with a history of COPD, coronary disease, paroxysmal atrial fibrillation on apixaban, hypertension, hyperlipidemia, diabetes mellitus type 2, CKD stage II presenting with a choking episode after eating some grits on 07/03/2019. This resulted in a vigorous cough and spitting up some phlegm. He had some chest burning at that time which was relieved with Rolaids. Because of his shortness of breath and choking, he presented for further evaluation. In the past week, the patient had denied any fevers, chills, chest pain, shortness breath, nausea, vomiting, or choking episodes. He states that his lower extremity edema is about the same as usual. He denied any worsening orthopnea or PND. However he states that he is missed taking his Lasix over the past 2 to 3 days prior to admission. In addition, the patient states that he has gained at least 30 pounds over the past week. His last home weight was 220 pounds. Notably, the patient had a recent prolonged hospitalization from 04/11/2019 through 05/22/2019 when he suffered from [id number] pneumonia complicated by sepsis. His hospitalization was complicated by respiratory failure requiring mechanical ventilation. The patient developed MRSA pneumonia and aspiration. He required reintubation after initial extubation. His hospitalization was further complicated by acute on chronic systolic and diastolic CHF, paroxysmal atrial fibrillation, COPD exacerbation, and urinary retention. He was subsequently discharged to [facility] where he stayed from 05/22/2019 through 06/07/2019. He was discharged home on a dysphagia 3 diet In the emergency department, the patient was afebrile and hemodynamically stable. With ambulation, the patient did desaturate to the 70% range. As result, admission was requested for further evaluation. BMP,  LFTs, and CBC were essentially unremarkable. Troponin was 9x2. EKG showed sinus rhythm with nonspecific T wave changes. Chest x-ray showed interstitial prominence with improvement of his bibasilar opacities.  Assessment/Plan: Acute respiratory failure with hypoxia -Secondary to pulmonary edema>>>aspiration pneumonitis in the setting of underlying COPD -Presently stable on 2 L nasal cannula>>>RA -Wean oxygen for saturation greater 92% -patient will need to go home on 2L [place] -I personally ambulated patient-->desaturated to 87% on RA  Acute on chronic systolic and diastolic CHF -[id number] echo EF 50-55%, normal RV -06/07/2017 echo EF 40-45% -remains mildly fluid overloaded -Continue furosemide 40 mg IV bid -Daily weights -Accurate I's and O's--NEG 3.5L  COVID-19 Positive -patient is not infectious at this point in time, does not have active disease presently -he had previous positive COVID tests 04/11/19 and 05/07/19 -he was treated with disease modifying agents -he is improving clinically not on any treatment -remain in droplet isolation  Aspiration pneumonitis -Speech therapy--dysphagia 3 diet with thin liquids -Patient continue IV Unasyn>>>amox/clav on 10/25  COPD -No wheezing on exam -Continue bronchodilators  Paroxysmal atrial fibrillation -Presently in sinus rhythm -Continue apixaban -continue metoprolol tartrate  Diabetes mellitus type 2 -1023/2020 hemoglobin A1c 6.7 -Holding Amaryl and Precose during hospitalization -NovoLog sliding scale -add novolog 3 units with meals -increase to moderate sliding scale  CKD stage II -Baseline creatinine1.1-1.3 -Monitor with diuresis  Hyperlipidemia -Continue statin  Anxiety -Continue home dose of alprazolam  Essential hypertension -Continue metoprolol tartrate  Coronary artery disease -No chest pain presently -Personally reviewed EKG--sinus rhythm, nonspecific T wave changes  Gluteal stage 2 ulcer  -present on admission -local care     Disposition Plan: Home 10/26 if stable Family Communication:NoFamily at bedside  Consultants:none  Code Status: FULL   DVT  Prophylaxis:apixaban   Procedures: As Listed in Progress Note Above  Antibiotics: Unasyn 10/23>>>10/25 Amox/clav 10/25>>>   Total time spent 35 minutes.  Greater than 50% spent face to face counseling and coordinating care.     Subjective: Patient denies fevers, chills, headache, chest pain, dyspnea, nausea, vomiting, diarrhea, abdominal pain, dysuria, hematuria, hematochezia, and melena.   Objective: Vitals:   07/05/19 1951 07/05/19 2041 07/06/19 0547 07/06/19 0851  BP:  136/66 (!) 125/50   Pulse: 74  72   Resp: 16 18 18    Temp:  97.7 F (36.5 C) 98 F (36.7 C)   TempSrc:  Oral Oral   SpO2: 95% 97% 94% 96%  Weight:      Height:        Intake/Output Summary (Last 24 hours) at 07/06/2019 0852 Last data filed at 07/06/2019 0707 Gross per 24 hour  Intake 1285.16 ml  Output 2650 ml  Net -1364.84 ml   Weight change:  Exam:   General:  Pt is alert, follows commands appropriately, not in acute distress  HEENT: No icterus, No thrush, No neck mass, [place]/AT  Cardiovascular: RRR, S1/S2, no rubs, no gallops  Respiratory: bibasilar crackles, no wheeze  Abdomen: Soft/+BS, non tender, non distended, no guarding  Extremities: trace LE edema, No lymphangitis, No petechiae, No rashes, no synovitis   Data Reviewed: I have personally reviewed following labs and imaging studies Basic Metabolic Panel: Recent Labs  Lab 07/03/19 2313 07/05/19 0618 07/06/19 0601  NA 136 139 134*  K 4.1 4.2 3.8  CL 106 101 99  CO2 23 27 26   GLUCOSE 143* 138* 202*  BUN 18 20 20   CREATININE 1.13 1.10 1.07  CALCIUM 8.8* 9.2 8.9  MG  --  1.8 1.8   Liver Function Tests: Recent Labs  Lab 07/03/19 2313 07/05/19 0618  AST 21 21  ALT 27 26  ALKPHOS 91 87  BILITOT 0.7 0.5  PROT 7.7 7.3   ALBUMIN 3.1* 3.1*   No results for input(s): LIPASE, AMYLASE in the last 168 hours. No results for input(s): AMMONIA in the last 168 hours. Coagulation Profile: No results for input(s): INR, PROTIME in the last 168 hours. CBC: Recent Labs  Lab 07/03/19 2313 07/05/19 0618 07/06/19 0601  WBC 5.2 5.9 5.5  NEUTROABS 3.7 3.9 3.3  HGB 9.8* 10.6* 10.9*  HCT 31.7* 33.6* 34.9*  MCV 92.2 90.6 89.9  PLT 183 187 187   Cardiac Enzymes: No results for input(s): CKTOTAL, CKMB, CKMBINDEX, TROPONINI in the last 168 hours. BNP: Invalid input(s): POCBNP CBG: Recent Labs  Lab 07/05/19 0727 07/05/19 1111 07/05/19 1606 07/05/19 2247 07/06/19 0735  GLUCAP 144* 308* 261* 397* 199*   HbA1C: Recent Labs    07/03/19 2313  HGBA1C 6.7*   Urine analysis:    Component Value Date/Time   COLORURINE YELLOW 04/25/2019 0826   APPEARANCEUR HAZY (A) 04/25/2019 0826   LABSPEC 1.015 04/25/2019 0826   PHURINE 5.0 04/25/2019 0826   GLUCOSEU 150 (A) 04/25/2019 0826   HGBUR LARGE (A) 04/25/2019 0826   BILIRUBINUR NEGATIVE 04/25/2019 0826   KETONESUR NEGATIVE 04/25/2019 0826   PROTEINUR 30 (A) 04/25/2019 0826   UROBILINOGEN 0.2 12/22/2011 2130   NITRITE NEGATIVE 04/25/2019 0826   LEUKOCYTESUR NEGATIVE 04/25/2019 0826   Sepsis Labs: @LABRCNTIP (procalcitonin:4,lacticidven:4) ) Recent Results (from the past 240 hour(s))  SARS CORONAVIRUS 2 ([employer] [employer] 6-24 HRS) Nasopharyngeal Nasopharyngeal Swab     Status: Abnormal   Collection Time: 07/04/19 12:29 AM   Specimen: Nasopharyngeal Swab  Result Value  Ref Range Status   SARS Coronavirus 2 POSITIVE (A) NEGATIVE Final    Comment: RESULT CALLED TO, READ BACK BY AND VERIFIED WITH: M.TAYLOR RN 1807 07/04/2019 MCCORMICK K (NOTE) SARS-CoV-2 target nucleic acids are DETECTED. The SARS-CoV-2 RNA is generally detectable in upper and lower respiratory specimens during the acute phase of infection. Positive results are indicative of active infection with SARS-CoV-2.  Clinical  correlation with patient history and other diagnostic information is necessary to determine patient infection status. Positive results do  not rule out bacterial infection or co-infection with other viruses. The expected result is Negative. Fact Sheet for Patients: SugarRoll.be Fact Sheet for Healthcare Providers: https://www.woods-mathews.com/ This test is not yet approved or cleared by the [place] FDA and  has been authorized for detection and/or diagnosis of SARS-CoV-2 by FDA under an Emergency Use Authorization (EUA). This EUA will remain  in effect (meaning this test can be used) f or the duration of the COVID-19 declaration under Section 564(b)(1) of the Act, 21 U.S.C. section 360bbb-3(b)(1), unless the authorization is terminated or revoked sooner. Performed at [facility], [place] [address], [place], [place] [address]   Blood culture (routine x 2)     Status: None (Preliminary result)   Collection Time: 07/04/19 12:50 AM   Specimen: BLOOD  Result Value Ref Range Status   Specimen Description BLOOD RIGHT ANTECUBITAL  Final   Special Requests   Final    BOTTLES DRAWN AEROBIC AND ANAEROBIC RIGHT ANTECUBITAL   Culture   Final    NO GROWTH 1 DAY Performed at [facility], [address], [place], [place] [address]    Report Status PENDING  Incomplete  Blood culture (routine x 2)     Status: None (Preliminary result)   Collection Time: 07/04/19 12:56 AM   Specimen: BLOOD  Result Value Ref Range Status   Specimen Description BLOOD LEFT ANTECUBITAL  Final   Special Requests   Final    BOTTLES DRAWN AEROBIC AND ANAEROBIC Blood Culture adequate volume   Culture   Final    NO GROWTH 1 DAY Performed at [facility], [address] [place], [place] [address]    Report Status PENDING  Incomplete     Scheduled Meds: . apixaban  5 mg Oral BID  . atorvastatin  80 mg Oral q1800  . collagenase   Topical Daily  . folic  acid  1 mg Oral Daily  . furosemide  40 mg Intravenous Daily  . insulin aspart  0-15 Units Subcutaneous TID WC  . insulin aspart  0-5 Units Subcutaneous QHS  . insulin aspart  3 Units Subcutaneous TID WC  . metoprolol tartrate  25 mg Oral BID  . mometasone-formoterol  2 puff Inhalation BID   Continuous Infusions: . ampicillin-sulbactam (UNASYN) IV 3 g (07/06/19 0612)    Procedures/Studies: Dg Chest 2 View  Result Date: 07/03/2019 CLINICAL DATA:  Choked on grits. EXAM: CHEST - 2 VIEW COMPARISON:  Chest radiograph 05/23/2019 CT 12/09/2016 FINDINGS: Chronic hyperinflation and emphysema. Improvement in bibasilar airspace disease from prior exam. Improvement in interstitial opacities, residual increase interstitial opacities and Kerley B-lines. Blunting of the left costophrenic angle most consistent with scarring. Unchanged heart size and mediastinal contours. Coronary stent is visualized. No pneumothorax. IMPRESSION: 1. Improving bibasilar airspace disease from prior exam. 2. Improving interstitial opacities with Kerley B-lines consistent with pulmonary edema, however improved from September radiographs. 3. Blunting of the left costophrenic angle suggests scarring and left pleural thickening/small effusion. Electronically Signed  By: Keith Rake M.D.   On: 07/03/2019 23:19   [facility] Pulmonary Perfusion  Result Date: 07/04/2019 CLINICAL DATA:  Choked on food yesterday. Persistent chest pain and shortness of breath. EXAM: NUCLEAR MEDICINE PERFUSION LUNG SCAN TECHNIQUE: Perfusion images were obtained in multiple projections after intravenous injection of radiopharmaceutical. Ventilation scans intentionally deferred if perfusion scan and chest x-ray adequate for interpretation during COVID 19 epidemic. RADIOPHARMACEUTICALS:  1.65 mCi Tc-31m MAA IV COMPARISON:  Chest x-ray 07/03/2019 FINDINGS: No segmental or subsegmental perfusion defects are identified to suggest pulmonary embolism. IMPRESSION:  Negative perfusion lung scan for pulmonary embolism. Electronically Signed   By: Marijo Sanes M.D.   On: 07/04/2019 07:35    Orson Eva, DO  [facility] Pager [phone]  If 7PM-7AM, please contact night-coverage [email or web address] Password [email or web address] 07/06/2019, 8:52 AM   LOS: 2 days

## 2019-07-06 NOTE — Progress Notes (Signed)
SATURATION QUALIFICATIONS: (This note is used to comply with regulatory documentation for home oxygen)  Patient Saturations on Room Air at Rest = 96%  Patient Saturations on Room Air while Ambulating = 87%  Patient Saturations on 2 Liters of oxygen while Ambulating = 96%  Please briefly explain why patient needs home oxygen: To maintain 02 sat at 90% or above during ambulation.   Orson Eva, DO

## 2019-07-06 NOTE — Progress Notes (Signed)
Pt requested to begin self cath as he does at home, stated he didn't feel that he was emptying this bladder completely with each void. Bladder scan performed after pt voided 150 ml yellow urine with results of no noted residual. Dr. Carles Collet notified of pt's request and bladder scan findings. Order received to perform I&O cath for residual. Pt voided 200 ml of urine, then I&O cath performed with 1000 ml of clear urine removed. Pt states immediate relief. MD notified.

## 2019-07-07 DIAGNOSIS — F064 Anxiety disorder due to known physiological condition: Secondary | ICD-10-CM

## 2019-07-07 LAB — BASIC METABOLIC PANEL
Anion gap: 10 (ref 5–15)
BUN: 20 mg/dL (ref 8–23)
CO2: 27 mmol/L (ref 22–32)
Calcium: 9.1 mg/dL (ref 8.9–10.3)
Chloride: 99 mmol/L (ref 98–111)
Creatinine, Ser: 1.16 mg/dL (ref 0.61–1.24)
GFR calc Af Amer: >60 mL/min (ref >60)
GFR calc non Af Amer: >60 mL/min (ref >60)
Glucose, Bld: 197 mg/dL — ABNORMAL HIGH (ref 70–99)
Potassium: 4.2 mmol/L (ref 3.5–5.1)
Sodium: 136 mmol/L (ref 135–145)

## 2019-07-07 LAB — GLUCOSE, CAPILLARY
Glucose-Capillary: 203 mg/dL — ABNORMAL HIGH (ref 70–99)
Glucose-Capillary: 307 mg/dL — ABNORMAL HIGH (ref 70–99)

## 2019-07-07 LAB — MAGNESIUM: Magnesium: 2.1 mg/dL (ref 1.7–2.4)

## 2019-07-07 MED ORDER — TAMSULOSIN HCL 0.4 MG PO CAPS: 0.8000 mg | ORAL_CAPSULE | Freq: Every day | ORAL | 0 refills | Status: AC

## 2019-07-07 MED ORDER — AMOXICILLIN-POT CLAVULANATE 875-125 MG PO TABS
1.0000 | ORAL_TABLET | Freq: Two times a day (BID) | ORAL | Status: DC
  Administered 2019-07-07: 1 via ORAL
  Filled 2019-07-07: qty 1

## 2019-07-07 MED ORDER — AMOXICILLIN-POT CLAVULANATE 875-125 MG PO TABS: 1.0000 | ORAL_TABLET | Freq: Two times a day (BID) | ORAL | 0 refills | Status: DC

## 2019-07-07 NOTE — Progress Notes (Signed)
Pharmacy Antibiotic Note  Today is day #4 of Unasyn therapy for this 68 yo male admitted with possible aspiration pneumonia.  Patient is currently afebrile and WBC count is WNL. Cultures have shown no growth to date.  Plan: Unasyn 3gm IV q6h Will f/u renal function, micro data, and pt's clinical condition  Height: 6' (182.9 cm) Weight: 220 lb (99.8 kg) IBW/kg (Calculated) : 77.6  Temp (24hrs), Avg:98.3 F (36.8 C), Min:98.2 F (36.8 C), Max:98.3 F (36.8 C)  Recent Labs  Lab 07/03/19 2313 07/05/19 0618 07/06/19 0601 07/07/19 0636  WBC 5.2 5.9 5.5  --   CREATININE 1.13 1.10 1.07 1.16  LATICACIDVEN 0.7  --   --   --     Estimated Creatinine Clearance: 76.6 mL/min (by C-G formula based on SCr of 1.16 mg/dL).     Antimicrobials this admission: 10/23 Unasyn>>  Microbiology results: 10/23 BC x2: NG x3 days  10/23 SARS coronavirus: positive  Thank you for allowing pharmacy to be a part of this patient's care.  Despina Pole, Pharm. D. Clinical Pharmacist 07/07/2019 10:59 AM

## 2019-07-07 NOTE — Discharge Summary (Signed)
Physician Discharge Summary  Robert [name] [id number] DOB: 29-Jul-1951 DOA: 07/03/2019  PCP: Redmond School, MD  Admit date: 07/03/2019 Discharge date: 07/07/2019  Admitted From: Home Disposition:  Home   Recommendations for Outpatient Follow-up:  1. Follow up with PCP in 1-2 weeks 2. Please obtain BMP/CBC in one week   Home Health: YES Equipment/Devices: HHPT, 2L [place]  Discharge Condition: Stable CODE STATUS: FULL Diet recommendation: Heart Healthy / Carb Modified--dysphagia 3   Brief/Interim Summary: 68 year old male with a history of COPD, coronary disease, paroxysmal atrial fibrillation on apixaban, hypertension, hyperlipidemia, diabetes mellitus type 2, CKD stage II presenting with a choking episode after eating some grits on 07/03/2019. This resulted in a vigorous cough and spitting up some phlegm. He had some chest burning at that time which was relieved with Rolaids. Because of his shortness of breath and choking, he presented for further evaluation. In the past week, the patient had denied any fevers, chills, chest pain, shortness breath, nausea, vomiting, or choking episodes. He states that his lower extremity edema is about the same as usual. He denied any worsening orthopnea or PND. However he states that he is missed taking his Lasix over the past 2 to 3 days prior to admission. In addition, the patient states that he has gained at least 30 pounds over the past week. His last home weight was 220 pounds. Notably, the patient had a recent prolonged hospitalization from 04/11/2019 through 05/22/2019 when he suffered from [id number] pneumonia complicated by sepsis. His hospitalization was complicated by respiratory failure requiring mechanical ventilation. The patient developed MRSA pneumonia and aspiration. He required reintubation after initial extubation. His hospitalization was further complicated by acute on chronic systolic and diastolic CHF, paroxysmal atrial  fibrillation, COPD exacerbation, and urinary retention. He was subsequently discharged to CIR where he stayed from 05/22/2019 through 06/07/2019. He was discharged home on a dysphagia 3 diet In the emergency department, the patient was afebrile and hemodynamically stable. With ambulation, the patient did desaturate to the 70% range. As result, admission was requested for further evaluation. BMP, LFTs, and CBC were essentially unremarkable. Troponin was 9x2. EKG showed sinus rhythm with nonspecific T wave changes. Chest x-ray showed interstitial prominence with improvement of his bibasilar opacities.  Discharge Diagnoses:   Acute respiratory failure with hypoxia -Secondary to pulmonary edema>>>aspiration pneumonitis in the setting of underlying COPD -Presently stable on 2 L nasal cannula>>>RA -Wean oxygen for saturation greater 92% -patient will need to go home on 2L [place] -I personally ambulated patient-->desaturated to 87% on RA  Acute on chronic systolic and diastolic CHF -[id number] echo EF 50-55%, normal RV -06/07/2017 echo EF 40-45% -Continue furosemide 40 mg IVbid>>>home with lasix 40 mg po daily -Daily weights- -Accurate I's and O's--NEG 7.9 L  COVID-19 Positive -patient is not infectious at this point in time, does not have active disease presently -he had previous positive COVID tests 04/11/19 and 05/07/19 -he was treated with disease modifying agents -he is improving clinically not on any treatment -remain in droplet isolation  Aspiration pneumonitis -Speech therapy--dysphagia 3 diet with thin liquids -Patient continue IV Unasyn>>>amox/clav x 2 more days after d/c to finish 5 days  COPD -No wheezing on exam -Continue bronchodilators  Paroxysmal atrial fibrillation -Presently in sinus rhythm -Continue apixaban  -continue metoprolol tartrate -on day of d/c, pt informed me he cannot afford apixaban $85 copay--discussed with case management--->pt will have to apply for  financial assistance through company -home with plavix for now -f/u Dr. Sallyanne Kuster  Diabetes mellitus  type 2 -1023/2020 hemoglobin A1c6.7 -Holding Amaryl and Precose during hospitalization--restart after d/c -NovoLog sliding scale -add novolog 3 units with meals -increase to moderate sliding scale  CKD stage II -Baseline creatinine1.1-1.3 -Monitor with diuresis -serum creatinine 1.16 on day of d/c  Hyperlipidemia -Continue statin  Anxiety -Continue home dose of alprazolam  Essential hypertension -Continue metoprolol tartrate  Coronary artery disease -No chest pain presently -Personally reviewed EKG--sinus rhythm, nonspecific T wave changes  Gluteal stage 2 ulcer -present on admission -local care   Discharge Instructions   Allergies as of 07/07/2019      Reactions   Ivp Dye [iodinated Diagnostic Agents] Itching, Rash      Medication List    STOP taking these medications   Actemra 162 MG/0.9ML [place] Generic drug: Tocilizumab   Methotrexate (PF) 25 MG/0.4ML Soaj   multivitamin with minerals Tabs tablet     TAKE these medications   acarbose 100 MG tablet Commonly known as: PRECOSE Take 1 tablet (100 mg total) by mouth 3 (three) times daily with meals.   acetaminophen 650 MG CR tablet Commonly known as: TYLENOL Take 650 mg by mouth every 8 (eight) hours as needed for pain.   ALPRAZolam 0.5 MG tablet Commonly known as: XANAX Take 1 tablet (0.5 mg total) by mouth 3 (three) times daily as needed for anxiety or sleep.   amoxicillin-clavulanate 875-125 MG tablet Commonly known as: AUGMENTIN Take 1 tablet by mouth every 12 (twelve) hours.   apixaban 5 MG Tabs tablet Commonly known as: ELIQUIS Take 1 tablet (5 mg total) by mouth 2 (two) times daily.   ascorbic acid 250 MG tablet Commonly known as: VITAMIN C Take 1 tablet (250 mg total) by mouth 2 (two) times daily.   atorvastatin 80 MG tablet Commonly known as: LIPITOR Take 1 tablet (80 mg  total) by mouth daily at 6 PM.   clopidogrel 75 MG tablet Commonly known as: PLAVIX TAKE (1) TABLET BY MOUTH ONCE DAILY. What changed:   how much to take  how to take this  when to take this   collagenase ointment Commonly known as: SANTYL Apply topically daily. And cover with damp to dry dressing daily. Change more frequently if soiled   fish oil-omega-3 fatty acids 1000 MG capsule Take 1 g by mouth daily.   folic acid 1 MG tablet Commonly known as: FOLVITE Take 1 tablet (1 mg total) by mouth daily.   furosemide 40 MG tablet Commonly known as: LASIX Take 1 tablet (40 mg total) by mouth daily.   gabapentin 100 MG capsule Commonly known as: NEURONTIN Take 2 capsules (200 mg total) by mouth every 8 (eight) hours. What changed: how much to take   glimepiride 2 MG tablet Commonly known as: AMARYL Take 1 tablet (2 mg total) by mouth daily with breakfast.   metoprolol tartrate 50 MG tablet Commonly known as: LOPRESSOR Take 1 tablet (50 mg total) by mouth 2 (two) times daily. What changed: how much to take   mometasone-formoterol 200-5 MCG/ACT Aero Commonly known as: DULERA Inhale 2 puffs into the lungs 2 (two) times daily.   nitroGLYCERIN 0.4 MG SL tablet Commonly known as: NITROSTAT Place 1 tablet (0.4 mg total) under the tongue every 5 (five) minutes x 3 doses as needed for chest pain.   nystatin powder Commonly known as: MYCOSTATIN/NYSTOP Apply topically 3 (three) times daily. What changed:   how much to take  when to take this  reasons to take this   potassium chloride SA  20 MEQ tablet Commonly known as: KLOR-CON Take 1 tablet (20 mEq total) by mouth 2 (two) times daily.   tamsulosin 0.4 MG Caps capsule Commonly known as: FLOMAX Take 2 capsules (0.8 mg total) by mouth daily after supper.            Durable Medical Equipment  (From admission, onward)         Start     Ordered   07/06/19 0912  For home use only DME oxygen  Once    Question  Answer Comment  Length of Need 6 Months   Mode or (Route) Nasal cannula   Liters per Minute 2   Frequency Continuous (stationary and portable oxygen unit needed)   Oxygen conserving device Yes   Oxygen delivery system Gas      07/06/19 0911         Follow-up Information    [facility] Follow up on 07/11/2019.   Specialty: Family Medicine Why: Please follow up with Dr. Gerarda Fraction on Friday, October 30th at 11:15am. Contact information: [place] [address] [phone]          Allergies  Allergen Reactions   Ivp Dye [Iodinated Diagnostic Agents] Itching and Rash    Consultations:  none   Procedures/Studies: Dg Chest 2 View  Result Date: 07/03/2019 CLINICAL DATA:  Choked on grits. EXAM: CHEST - 2 VIEW COMPARISON:  Chest radiograph 05/23/2019 CT 12/09/2016 FINDINGS: Chronic hyperinflation and emphysema. Improvement in bibasilar airspace disease from prior exam. Improvement in interstitial opacities, residual increase interstitial opacities and Kerley B-lines. Blunting of the left costophrenic angle most consistent with scarring. Unchanged heart size and mediastinal contours. Coronary stent is visualized. No pneumothorax. IMPRESSION: 1. Improving bibasilar airspace disease from prior exam. 2. Improving interstitial opacities with Kerley B-lines consistent with pulmonary edema, however improved from September radiographs. 3. Blunting of the left costophrenic angle suggests scarring and left pleural thickening/small effusion. Electronically Signed   By: Keith Rake M.D.   On: 07/03/2019 23:19   Nm Pulmonary Perfusion  Result Date: 07/04/2019 CLINICAL DATA:  Choked on food yesterday. Persistent chest pain and shortness of breath. EXAM: NUCLEAR MEDICINE PERFUSION LUNG SCAN TECHNIQUE: Perfusion images were obtained in multiple projections after intravenous injection of radiopharmaceutical. Ventilation scans intentionally deferred if  perfusion scan and chest x-ray adequate for interpretation during COVID 19 epidemic. RADIOPHARMACEUTICALS:  1.65 mCi Tc-75m MAA IV COMPARISON:  Chest x-ray 07/03/2019 FINDINGS: No segmental or subsegmental perfusion defects are identified to suggest pulmonary embolism. IMPRESSION: Negative perfusion lung scan for pulmonary embolism. Electronically Signed   By: Marijo Sanes M.D.   On: 07/04/2019 07:35         Discharge Exam: Vitals:   07/07/19 0551 07/07/19 0835  BP: 138/80   Pulse: 70   Resp: 18   Temp: 98.3 F (36.8 C)   SpO2: 96% 99%   Vitals:   07/06/19 2119 07/06/19 2124 07/07/19 0551 07/07/19 0835  BP:  (!) 142/71 138/80   Pulse:  69 70   Resp:  16 18   Temp:  98.2 F (36.8 C) 98.3 F (36.8 C)   TempSrc:  Oral Oral   SpO2: 97% 98% 96% 99%  Weight:      Height:        General: Pt is alert, awake, not in acute distress Cardiovascular: RRR, S1/S2 +, no rubs, no gallops Respiratory: fine bibasilar crackles Abdominal: Soft, NT, ND, bowel sounds + Extremities: trace LE edema, no cyanosis  The results of significant diagnostics from this hospitalization (including imaging, microbiology, ancillary and laboratory) are listed below for reference.    Significant Diagnostic Studies: Dg Chest 2 View  Result Date: 07/03/2019 CLINICAL DATA:  Choked on grits. EXAM: CHEST - 2 VIEW COMPARISON:  Chest radiograph 05/23/2019 CT 12/09/2016 FINDINGS: Chronic hyperinflation and emphysema. Improvement in bibasilar airspace disease from prior exam. Improvement in interstitial opacities, residual increase interstitial opacities and Kerley B-lines. Blunting of the left costophrenic angle most consistent with scarring. Unchanged heart size and mediastinal contours. Coronary stent is visualized. No pneumothorax. IMPRESSION: 1. Improving bibasilar airspace disease from prior exam. 2. Improving interstitial opacities with Kerley B-lines consistent with pulmonary edema, however improved from  September radiographs. 3. Blunting of the left costophrenic angle suggests scarring and left pleural thickening/small effusion. Electronically Signed   By: Keith Rake M.D.   On: 07/03/2019 23:19   Nm Pulmonary Perfusion  Result Date: 07/04/2019 CLINICAL DATA:  Choked on food yesterday. Persistent chest pain and shortness of breath. EXAM: NUCLEAR MEDICINE PERFUSION LUNG SCAN TECHNIQUE: Perfusion images were obtained in multiple projections after intravenous injection of radiopharmaceutical. Ventilation scans intentionally deferred if perfusion scan and chest x-ray adequate for interpretation during COVID 19 epidemic. RADIOPHARMACEUTICALS:  1.65 mCi Tc-102m MAA IV COMPARISON:  Chest x-ray 07/03/2019 FINDINGS: No segmental or subsegmental perfusion defects are identified to suggest pulmonary embolism. IMPRESSION: Negative perfusion lung scan for pulmonary embolism. Electronically Signed   By: Marijo Sanes M.D.   On: 07/04/2019 07:35     Microbiology: Recent Results (from the past 240 hour(s))  SARS CORONAVIRUS 2 (Antoria Lanza 6-24 HRS) Nasopharyngeal Nasopharyngeal Swab     Status: Abnormal   Collection Time: 07/04/19 12:29 AM   Specimen: Nasopharyngeal Swab  Result Value Ref Range Status   SARS Coronavirus 2 POSITIVE (A) NEGATIVE Final    Comment: RESULT CALLED TO, READ BACK BY AND VERIFIED WITH: M.TAYLOR RN 1807 07/04/2019 MCCORMICK K (NOTE) SARS-CoV-2 target nucleic acids are DETECTED. The SARS-CoV-2 RNA is generally detectable in upper and lower respiratory specimens during the acute phase of infection. Positive results are indicative of active infection with SARS-CoV-2. Clinical  correlation with patient history and other diagnostic information is necessary to determine patient infection status. Positive results do  not rule out bacterial infection or co-infection with other viruses. The expected result is Negative. Fact Sheet for Patients: SugarRoll.be Fact  Sheet for Healthcare Providers: https://www.woods-mathews.com/ This test is not yet approved or cleared by the [place] FDA and  has been authorized for detection and/or diagnosis of SARS-CoV-2 by FDA under an Emergency Use Authorization (EUA). This EUA will remain  in effect (meaning this test can be used) f or the duration of the COVID-19 declaration under Section 564(b)(1) of the Act, 21 U.S.C. section 360bbb-3(b)(1), unless the authorization is terminated or revoked sooner. Performed at [facility], [place] [address], [place], [place] [address]   Blood culture (routine x 2)     Status: None (Preliminary result)   Collection Time: 07/04/19 12:50 AM   Specimen: BLOOD  Result Value Ref Range Status   Specimen Description BLOOD RIGHT ANTECUBITAL  Final   Special Requests   Final    BOTTLES DRAWN AEROBIC AND ANAEROBIC RIGHT ANTECUBITAL   Culture   Final    NO GROWTH 3 DAYS Performed at [facility], [address], [place], [place] [address]    Report Status PENDING  Incomplete  Blood culture (routine x 2)     Status: None (Preliminary  result)   Collection Time: 07/04/19 12:56 AM   Specimen: BLOOD  Result Value Ref Range Status   Specimen Description BLOOD LEFT ANTECUBITAL  Final   Special Requests   Final    BOTTLES DRAWN AEROBIC AND ANAEROBIC Blood Culture adequate volume   Culture   Final    NO GROWTH 3 DAYS Performed at [facility], [address], [place], [place] [address]    Report Status PENDING  Incomplete     Labs: Basic Metabolic Panel: Recent Labs  Lab 07/03/19 2313 07/05/19 0618 07/06/19 0601 07/07/19 0636  NA 136 139 134* 136  K 4.1 4.2 3.8 4.2  CL 106 101 99 99  CO2 23 27 26 27   GLUCOSE 143* 138* 202* 197*  BUN 18 20 20 20   CREATININE 1.13 1.10 1.07 1.16  CALCIUM 8.8* 9.2 8.9 9.1  MG  --  1.8 1.8 2.1   Liver Function Tests: Recent Labs  Lab 07/03/19 2313 07/05/19 0618  AST 21 21  ALT 27 26  ALKPHOS 91 87  BILITOT 0.7  0.5  PROT 7.7 7.3  ALBUMIN 3.1* 3.1*   No results for input(s): LIPASE, AMYLASE in the last 168 hours. No results for input(s): AMMONIA in the last 168 hours. CBC: Recent Labs  Lab 07/03/19 2313 07/05/19 0618 07/06/19 0601  WBC 5.2 5.9 5.5  NEUTROABS 3.7 3.9 3.3  HGB 9.8* 10.6* 10.9*  HCT 31.7* 33.6* 34.9*  MCV 92.2 90.6 89.9  PLT 183 187 187   Cardiac Enzymes: No results for input(s): CKTOTAL, CKMB, CKMBINDEX, TROPONINI in the last 168 hours. BNP: Invalid input(s): POCBNP CBG: Recent Labs  Lab 07/06/19 1118 07/06/19 1628 07/06/19 2126 07/07/19 0738 07/07/19 1102  GLUCAP 383* 199* 272* 203* 307*    Time coordinating discharge:  36 minutes  Signed:  Orson Eva, DO [facility] Pager: [phone] 07/07/2019, 1:41 PM

## 2019-07-07 NOTE — Care Management (Signed)
Home Health Agency InformationSorted ascending, Select to sort descending  Quality of Patient Care RatingSelect to sort ascending or descending . A rating of 4 or more stars means the agency performed better than most other agencies on selected measures. . A rating of 3 to 3 stars means that the agency performed about the same as most agencies. . A rating of fewer than 3 stars means that the agency's performance was below the average of other agencies on selected measures." class="info" href="javascript:void(0)". A rating of 4 or more stars means the agency performed better than most other agencies on selected measures. . A rating of 3 to 3 stars means that the agency performed about the same as most agencies. . A rating of fewer than 3 stars means that the agency's performance was below the average of other agencies on selected measures." class="info" href="javascript:void(0)"  Patient Survey Summary RatingSelect to sort ascending or descending    [facility] [phone]   [place] to my Favorites  Quality of Patient Care Rating4 out of 5 stars Patient Survey Summary Rating4 out of [place] [phone]   [place] to my Favorites  Quality of Patient Care Rating3 out of 5 stars Patient Survey Summary Rating5 out of [place] [phone]   [employer] to my Favorites  Quality of Patient Care Rating3 out of 5 stars Patient Survey Summary Rating4 out of [place] [phone]   [place] to my [place]  out of 5 stars Patient Survey Summary Rating4 out of [place] [phone]   [place] to my [place]  out of 5 stars Patient Survey Summary Rating3 out of [place] [phone]   [place] to my  Favorites  Quality of Patient Care Rating4 out of 5 stars Patient Survey Summary Rating4 out of [place] [phone]   [place] to my Favorites  Quality of Patient Care Rating4 out of 5 stars Patient Survey Summary Rating4 out of [place] [phone]   [place] to my Favorites  Quality of Patient Care Rating4 out of 5 stars Patient Survey Summary Rating4 out of [place] AGE [phone]   [place] AGE to my Favorites  Quality of Patient Care Rating3 out of 5 stars Patient Survey Summary Rating3 out of 5 stars  [facility] [place] [phone]   Add [facility] [place] to my Favorites  Quality of Patient Care Rating3  out of 5 stars Patient Survey Summary Rating4 out of [place] [phone]   [place] to my Favorites  Quality of Patient Care Rating3 out of 5 stars Patient Survey Summary Rating4 out of 5 stars  [facility] [phone]   Add [facility] to my Favorites  Quality of Patient Care Rating3  out of 5 stars Patient Survey Summary Rating3 out of [place] [phone]   [place]  to my Favorites  Quality of Patient Care Rating3  out of 5 stars Not Available11  [facility] [phone]   Add [facility] to my Favorites  Quality of Patient Care Rating4  out of 5 stars Patient Survey Summary Rating3 out of 5 stars  Viewing 1 - 14 of 14 results  

## 2019-07-07 NOTE — TOC Transition Note (Signed)
Transition of Care [facility]) - CM/SW Discharge Note   Patient Details  Name: TRAYLEN HARTSHORN MRN: [id number] Date of Birth: March 06, 1951  [facility]) CM/SW Contact:  [place] Mckeough, Chauncey Reading, RN Phone Number: 07/07/2019, 1:48 PM   Clinical Narrative:   Patient having issues affording copay for Eliquis. He has used a 30 day coupon for Eliquis in the past. He does have prescription coverage. Will place [facility] referral for help with medication cost and follow up on Eliquis financial assistance programs.    Final next level of care: [place] Barriers to Discharge: Continued Medical Work up   Patient Goals and CMS Choice Patient states their goals for this hospitalization and ongoing recovery are:: return home CMS Medicare.gov Compare Post Acute Care list provided to:: Patient Choice offered to / list presented to : Patient  Discharge Placement                       Discharge Plan and Services   Discharge Planning Services: CM Consult Post Acute Care Choice: Home Health          DME Arranged: Oxygen DME Agency: [facility] Date DME Agency Contacted: 07/07/19 Time DME Agency Contacted: [id number] Representative spoke with at DME Agency: [place]: PT [place]: [place] Date [place]: 07/07/19 Time [place]: [id number] Representative spoke with at [place]: [place] ([place]) Interventions     Readmission Risk Interventions Readmission Risk Prevention Plan 07/07/2019 07/04/2019  Transportation Screening - Complete  PCP or Specialist Appt within 3-5 Days Complete -  HRI or Home Care Consult Complete -  Social Work Consult for [place] Planning/Counseling - Complete  Palliative Care Screening - Not Applicable  Medication Review Press photographer) - Complete  Some recent data might be hidden

## 2019-07-07 NOTE — Progress Notes (Signed)
Inpatient Diabetes Program Recommendations  AACE/ADA: New Consensus Statement on Inpatient Glycemic Control (2015)  Target Ranges:  Prepandial:   less than 140 mg/dL      Peak postprandial:   less than 180 mg/dL (1-2 hours)      Critically ill patients:  140 - 180 mg/dL   Lab Results  Component Value Date   GLUCAP 307 (H) 07/07/2019   HGBA1C 6.7 (H) 07/03/2019    Review of Glycemic Control Results for Robert [name], Robert [name] (MRN [id number]) as of 07/07/2019 12:00  Ref. Range 07/06/2019 07:35 07/06/2019 11:18 07/06/2019 16:28 07/06/2019 21:26 07/07/2019 07:38 07/07/2019 11:02  Glucose-Capillary Latest Ref Range: 70 - 99 mg/dL 199 (H) 383 (H) 199 (H) 272 (H) 203 (H) 307 (H)   Diabetes history: DM 2 Outpatient Diabetes medications: Amaryl 2 mg Daily Current orders for Inpatient glycemic control: Novolog 0-15 units tid + hs Novolog 3 units tid meal coverage  Inpatient Diabetes Program Recommendations:    Glucose trends still increase after Novolog 3 units meal coverage with meals.  Consider increasing meal coverage to Novolog 5 units tid if eating at least 50% of meals.  Thanks,  Tama Headings RN, MSN, BC-ADM Inpatient Diabetes Coordinator Team Pager [phone] (8a-5p)

## 2019-07-07 NOTE — TOC Transition Note (Signed)
Transition of Care [facility]) - CM/SW Discharge Note   Patient Details  Name: Robert [name] MRN: [id number] Date of Birth: 1950/10/21  Transition of Care [facility]) CM/SW Contact:  Vallie Teters, Chauncey Reading, RN Phone Number: 07/07/2019, 10:50 AM   Clinical Narrative:   Home O2 eval completed by attending. Patient qualifies, elects [facility]. Home health PT ordered, patient reports he recently finished with [facility] PT,  would like to begin services again.     Final next level of care: [place] Barriers to Discharge: Continued Medical Work up   Patient Goals and CMS Choice Patient states their goals for this hospitalization and ongoing recovery are:: return home CMS Medicare.gov Compare Post Acute Care list provided to:: Patient Choice offered to / list presented to : Patient     Discharge Plan and Services   Discharge Planning Services: CM Consult Post Acute Care Choice: Home Health          DME Arranged: Oxygen DME Agency: [facility] Date DME Agency Contacted: 07/07/19 Time DME Agency Contacted: [id number] Representative spoke with at DME Agency: [place]: PT [place]: [place] Date [place]: 07/07/19 Time [place]: [id number] Representative spoke with at [place]: [place] ([place]) Interventions     Readmission Risk Interventions Readmission Risk Prevention Plan 07/07/2019 07/04/2019  Transportation Screening - Complete  PCP or Specialist Appt within 3-5 Days Complete -  HRI or Home Care Consult Complete -  Social Work Consult for [place] Planning/Counseling - Complete  Palliative Care Screening - Not Applicable  Medication Review Press photographer) - Complete  Some recent data might be hidden

## 2019-07-07 NOTE — Care Management Important Message (Signed)
Important Message  Patient Details  Name: Robert [name] MRN: [id number] Date of Birth: [birth year]   Medicare Important Message Given:  Yes(given to RN to deliver to patient due to contact precautions)     Tommy Medal 07/07/2019, 1:25 PM

## 2019-07-08 ENCOUNTER — Other Ambulatory Visit: Payer: Self-pay

## 2019-07-08 DIAGNOSIS — D631 Anemia in chronic kidney disease: Secondary | ICD-10-CM | POA: Diagnosis not present

## 2019-07-08 DIAGNOSIS — I130 Hypertensive heart and chronic kidney disease with heart failure and stage 1 through stage 4 chronic kidney disease, or unspecified chronic kidney disease: Secondary | ICD-10-CM | POA: Diagnosis not present

## 2019-07-08 DIAGNOSIS — E1122 Type 2 diabetes mellitus with diabetic chronic kidney disease: Secondary | ICD-10-CM | POA: Diagnosis not present

## 2019-07-08 DIAGNOSIS — J1289 Other viral pneumonia: Secondary | ICD-10-CM | POA: Diagnosis not present

## 2019-07-08 DIAGNOSIS — N183 Chronic kidney disease, stage 3 unspecified: Secondary | ICD-10-CM | POA: Diagnosis not present

## 2019-07-08 DIAGNOSIS — U071 COVID-19: Secondary | ICD-10-CM | POA: Diagnosis not present

## 2019-07-08 DIAGNOSIS — I5023 Acute on chronic systolic (congestive) heart failure: Secondary | ICD-10-CM | POA: Diagnosis not present

## 2019-07-08 DIAGNOSIS — M069 Rheumatoid arthritis, unspecified: Secondary | ICD-10-CM | POA: Diagnosis not present

## 2019-07-08 DIAGNOSIS — L89310 Pressure ulcer of right buttock, unstageable: Secondary | ICD-10-CM | POA: Diagnosis not present

## 2019-07-08 NOTE — Patient Outreach (Signed)
Robert [name] [facility]) Care Management  Robert [name]  07/08/2019   Robert [name] March 29, 1951 [id number]  Subjective: Telephone call to patient for follow up from discharge on 07/07/2019 for COPD/pneumonia. Patient states he feels good and glad to be back home. Patient lives in the home with his step-son and has assist from his daughter as well.    Patient with history of COPD, coronary disease, paroxysmal atrial fibrillation on apixaban, hypertension, hyperlipidemia, diabetes mellitus type 2, CHF,CKD stage II, previous COVID-19 presenting with a choking episode after eating some grits on 07/03/2019.  Patient was in acute respiratory failure related to aspiration and pulmonary edema.    Patient states he has had a rough time the last few months with all he has been through.  Patient has home health through [facility] and they will continue per patient.  He has a sacral wound, which he is told is healing.  The home health nurse changes the dressing along with his daughter on the days that home health does not come.  Patient states he was sent home with oxygen but has not needed it. Discussed with patient COPD and signs of worsening and importance of MD follow up. Patient has follow up scheduled for 07/11/2019 per patient but patient will call to confirm.  Patient does have transportation to appointments. Discussed with patient health safety such as wearing a mask, avoiding those that are sick and hand washing.  He verbalized understanding.    Patient takes medications as prescribed but has trouble affording his Eliquis. Discussed [facility] services.  Patient agreeable to nurse and pharmacy at this time.        Objective:   Encounter Medications:  Outpatient Encounter Medications as of 07/08/2019  Medication Sig  . acarbose (PRECOSE) 100 MG tablet Take 1 tablet (100 mg total) by mouth 3 (three) times daily with meals.  [name] acetaminophen (TYLENOL) 650 MG CR tablet Take 650 mg by mouth every 8  (eight) hours as needed for pain.  [name] ALPRAZolam (XANAX) 0.5 MG tablet Take 1 tablet (0.5 mg total) by mouth 3 (three) times daily as needed for anxiety or sleep.  [name] amoxicillin-clavulanate (AUGMENTIN) 875-125 MG tablet Take 1 tablet by mouth every 12 (twelve) hours.  [name] atorvastatin (LIPITOR) 80 MG tablet Take 1 tablet (80 mg total) by mouth daily at 6 PM.  . clopidogrel (PLAVIX) 75 MG tablet TAKE (1) TABLET BY MOUTH ONCE DAILY. (Patient taking differently: Take 75 mg by mouth daily. TAKE (1) TABLET BY MOUTH ONCE DAILY.)  . collagenase (SANTYL) ointment Apply topically daily. And cover with damp to dry dressing daily. Change more frequently if soiled  . fish oil-omega-3 fatty acids 1000 MG capsule Take 1 g by mouth daily.   . folic acid (FOLVITE) 1 MG tablet Take 1 tablet (1 mg total) by mouth daily.  [name] gabapentin (NEURONTIN) 100 MG capsule Take 2 capsules (200 mg total) by mouth every 8 (eight) hours. (Patient taking differently: Take 300 mg by mouth every 8 (eight) hours. )  . glimepiride (AMARYL) 2 MG tablet Take 1 tablet (2 mg total) by mouth daily with breakfast.  . mometasone-formoterol (DULERA) 200-5 MCG/ACT AERO Inhale 2 puffs into the lungs 2 (two) times daily.  . nitroGLYCERIN (NITROSTAT) 0.4 MG SL tablet Place 1 tablet (0.4 mg total) under the tongue every 5 (five) minutes x 3 doses as needed for chest pain.  [name] nystatin (MYCOSTATIN/NYSTOP) powder Apply topically 3 (three) times daily. (Patient taking differently: Apply 1 g topically  daily as needed. )  . potassium chloride SA (K-DUR) 20 MEQ tablet Take 1 tablet (20 mEq total) by mouth 2 (two) times daily.  . tamsulosin (FLOMAX) 0.4 MG CAPS capsule Take 2 capsules (0.8 mg total) by mouth daily after supper.  . vitamin C (VITAMIN C) 250 MG tablet Take 1 tablet (250 mg total) by mouth 2 (two) times daily.  [name] apixaban (ELIQUIS) 5 MG TABS tablet Take 1 tablet (5 mg total) by mouth 2 (two) times daily. (Patient not taking: Reported on 07/04/2019)   . furosemide (LASIX) 40 MG tablet Take 1 tablet (40 mg total) by mouth daily.  . metoprolol tartrate (LOPRESSOR) 50 MG tablet Take 1 tablet (50 mg total) by mouth 2 (two) times daily. (Patient taking differently: Take 25 mg by mouth 2 (two) times daily. )   No facility-administered encounter medications on file as of 07/08/2019.     Functional Status:  In your present state of health, do you have any difficulty performing the following activities: 07/08/2019 07/04/2019  Hearing? N -  Vision? N -  Difficulty concentrating or making decisions? N -  Walking or climbing stairs? N -  Comment - -  Dressing or bathing? N -  Comment - -  Doing errands, shopping? N N  Preparing Food and eating ? N -  Using the Toilet? N -  In the past six months, have you accidently leaked urine? N -  Do you have problems with loss of bowel control? N -  Managing your Medications? N -  Managing your Finances? N -  Housekeeping or managing your Housekeeping? N -  Some recent data might be hidden    Fall/Depression Screening: Fall Risk  07/08/2019 06/20/2019  Falls in the past year? 0 0   PHQ 2/9 Scores 07/08/2019 06/20/2019  PHQ - 2 Score 0 0    Assessment: Patient with recurrent hospitalizations related to COVID-19, COPD and Heart failure.    Plan:  [facility] CM Care Plan Problem One     Most Recent Value  Care Plan Problem One  Recent hospitalization related to COPD/pneumonia  Role Documenting the Problem One  Care Management Telephonic [place] for Problem One  Active  [facility] Long Term Goal   Patient will not readmit to hospital with COPD/pneumonia within the next 31 days.  [facility] Long Term Goal Start Date  07/08/19  Interventions for Problem One Long Term Goal  RN CM discussed with patient COPD and pneumonia and signs of worsening.   Discussed with patient current clinical condition since hospital discharge,  confirmed that patient has all  medications and denies concerns around medications,   reviewed post-hospital discharge instructions with patient and confirmed that patient has reliable transportation to all scheduled provider appointments post-hospital discharge.  [facility] CM Short Term Goal #1   Patient will follow up with PCP within 14 days  [facility] CM Short Term Goal #1 Start Date  07/08/19  Interventions for Short Term Goal #1  RN CM discussed importance of follow up appointments.  [facility] CM Short Term Goal #2   Patient will have home health services restarted within the next 14 days  [facility] CM Short Term Goal #2 Start Date  07/08/19  Interventions for Short Term Goal #2  Patient previously involved with [facility] home care and has been in contact since hospitalization.  Patient states he thinks visit scheduled for tomorrow.     RN CM will provide ongoing education and support to patient through phone  calls.   RN CM will send welcome packet with consent to patient.   RN CM will send initial barriers letter, assessment, and care plan to primary care physician.   RN CM will contact patient in the next two weeks and patient agrees to next contact.    Jone Baseman, RN, MSN [place] Management Care Management Coordinator Direct Line [phone] Cell [phone] Toll Free: [phone]  Fax: [phone]

## 2019-07-09 ENCOUNTER — Other Ambulatory Visit: Payer: Self-pay | Admitting: Pharmacist

## 2019-07-09 DIAGNOSIS — N183 Chronic kidney disease, stage 3 unspecified: Secondary | ICD-10-CM | POA: Diagnosis not present

## 2019-07-09 DIAGNOSIS — I0981 Rheumatic heart failure: Secondary | ICD-10-CM | POA: Diagnosis not present

## 2019-07-09 DIAGNOSIS — L89312 Pressure ulcer of right buttock, stage 2: Secondary | ICD-10-CM | POA: Diagnosis not present

## 2019-07-09 DIAGNOSIS — I5043 Acute on chronic combined systolic (congestive) and diastolic (congestive) heart failure: Secondary | ICD-10-CM | POA: Diagnosis not present

## 2019-07-09 DIAGNOSIS — E1122 Type 2 diabetes mellitus with diabetic chronic kidney disease: Secondary | ICD-10-CM | POA: Diagnosis not present

## 2019-07-09 DIAGNOSIS — D631 Anemia in chronic kidney disease: Secondary | ICD-10-CM | POA: Diagnosis not present

## 2019-07-09 DIAGNOSIS — I130 Hypertensive heart and chronic kidney disease with heart failure and stage 1 through stage 4 chronic kidney disease, or unspecified chronic kidney disease: Secondary | ICD-10-CM | POA: Diagnosis not present

## 2019-07-09 DIAGNOSIS — I081 Rheumatic disorders of both mitral and tricuspid valves: Secondary | ICD-10-CM | POA: Diagnosis not present

## 2019-07-09 DIAGNOSIS — A419 Sepsis, unspecified organism: Secondary | ICD-10-CM | POA: Diagnosis not present

## 2019-07-09 LAB — CULTURE, BLOOD (ROUTINE X 2)
Culture: NO GROWTH
Culture: NO GROWTH
Special Requests: ADEQUATE

## 2019-07-09 NOTE — Patient Outreach (Signed)
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  Patient re-admitted 10/22- 07/07/2019 for aspiration PNA requiring mechanical ventilation, acute on chronic CHF, PAF, AECOPD, and urinary retention.  Per discharge notes, patient reported he could not afford recently started apixaban.    Upon review of patient, PCP is Dr. Redmond School who has contract with [facility].  PharmD Ewell Poe works at PCP office.  Call placed to PharmD and I was able to speak directly with pharmacist to review patient and transfer referral.  Per pharmacist, patient has appointment on Friday with PCP and she will contact him regarding patient assistance program application for apixaban   Plan: Will close [facility] pharmacy case as patient will be contacted by PharmD at PCP office  Ralene Bathe, PharmD, [place] [phone]

## 2019-07-10 ENCOUNTER — Other Ambulatory Visit: Payer: Self-pay | Admitting: Pharmacist

## 2019-07-10 ENCOUNTER — Other Ambulatory Visit: Payer: Self-pay

## 2019-07-10 DIAGNOSIS — J449 Chronic obstructive pulmonary disease, unspecified: Secondary | ICD-10-CM | POA: Diagnosis not present

## 2019-07-10 NOTE — Patient Outreach (Signed)
[place] [facility]) Care Management  07/10/2019  MERLYN BATTAGLIA [birth year] [id number]   EMMI- General Discharge RED ON EMMI ALERT Day # 1 Date: 07/09/2019 Red Alert Reason:  Other questions/problems? Yes  Outreach attempt:spoke with patient. He states that the only concern he had was that the eliquis is $124/month per pharmacy and not the correct amount he told this CM earlier this week.  Advised patient that pharmacist at his MD office will discuss patient assistance for eliquis on his visit to the MD office on tomorrow per [facility] pharmacist note.  He verbalized understanding. Patient declines any further questions or concerns at this time.     Plan: RN CM will contact patient in the month of November and patient agreeable.    Jone Baseman, RN, MSN [facility] Care Management Care Management Coordinator Direct Line [phone] Toll Free: [phone]  Fax: [phone]

## 2019-07-10 NOTE — Patient Outreach (Signed)
[place] [facility]) Care Management  [place] 07/10/2019  Robert [name] July 23, 1951 [id number]  Reason for call: Incoming call from patient to [facility].  Request from [facility] for pharmacy to reach out to patient.   Successful call to patient today.  We reviewed that pharmacist at Dr. Nolon Rod office will be helping him apply for Eliquis at office visit tomorrow.  I advised him to bring in copy of his proof of income and print out of co-pays from pharmacy since 2020 as these documents will be required for application process.  Patient voiced understanding and appreciation.  He has no other questions at this time.  Plan: Keep [facility] pharmacy case closed.   Ralene Bathe, PharmD, [place] [phone]

## 2019-07-11 DIAGNOSIS — J9620 Acute and chronic respiratory failure, unspecified whether with hypoxia or hypercapnia: Secondary | ICD-10-CM | POA: Diagnosis not present

## 2019-07-11 DIAGNOSIS — J189 Pneumonia, unspecified organism: Secondary | ICD-10-CM | POA: Diagnosis not present

## 2019-07-11 DIAGNOSIS — U071 COVID-19: Secondary | ICD-10-CM | POA: Diagnosis not present

## 2019-07-11 DIAGNOSIS — Z6829 Body mass index (BMI) 29.0-29.9, adult: Secondary | ICD-10-CM | POA: Diagnosis not present

## 2019-07-14 ENCOUNTER — Other Ambulatory Visit: Payer: Self-pay

## 2019-07-14 ENCOUNTER — Ambulatory Visit: Payer: Medicare HMO

## 2019-07-14 DIAGNOSIS — D631 Anemia in chronic kidney disease: Secondary | ICD-10-CM | POA: Diagnosis not present

## 2019-07-14 DIAGNOSIS — N183 Chronic kidney disease, stage 3 unspecified: Secondary | ICD-10-CM | POA: Diagnosis not present

## 2019-07-14 DIAGNOSIS — I0981 Rheumatic heart failure: Secondary | ICD-10-CM | POA: Diagnosis not present

## 2019-07-14 DIAGNOSIS — A419 Sepsis, unspecified organism: Secondary | ICD-10-CM | POA: Diagnosis not present

## 2019-07-14 DIAGNOSIS — R339 Retention of urine, unspecified: Secondary | ICD-10-CM | POA: Diagnosis not present

## 2019-07-14 DIAGNOSIS — I130 Hypertensive heart and chronic kidney disease with heart failure and stage 1 through stage 4 chronic kidney disease, or unspecified chronic kidney disease: Secondary | ICD-10-CM | POA: Diagnosis not present

## 2019-07-14 DIAGNOSIS — L89312 Pressure ulcer of right buttock, stage 2: Secondary | ICD-10-CM | POA: Diagnosis not present

## 2019-07-14 DIAGNOSIS — I081 Rheumatic disorders of both mitral and tricuspid valves: Secondary | ICD-10-CM | POA: Diagnosis not present

## 2019-07-14 DIAGNOSIS — E1122 Type 2 diabetes mellitus with diabetic chronic kidney disease: Secondary | ICD-10-CM | POA: Diagnosis not present

## 2019-07-14 DIAGNOSIS — I5043 Acute on chronic combined systolic (congestive) and diastolic (congestive) heart failure: Secondary | ICD-10-CM | POA: Diagnosis not present

## 2019-07-14 NOTE — Patient Outreach (Signed)
Robert [name] [facility]) Care Management  07/14/2019  [name] 10-29-1950 [id number]   EMMI- General Discharge RED ON EMMI ALERT Day # 4 Date: 07/12/2019 Red Alert Reason:  Lost interest in things? Yes  Other questions/problems? Yes    Outreach attempt: spoke with patient. He reports he is doing good.  Robert [name] came out to visit and he saw Dr. Gerarda Fraction last week.  He reports that his visit went well. He states that he is working with the pharmacist at the MD office about his eliquis.  He states he is taking ASA and plavix.  He reports that patient he goes back to the doctor on 08/06/2019.  Addressed red alert.  Patient states he was not sure about what the machine said.  He denies any problems right now and denies any other questions.  Advised patient on listening to his body and not over doing it.  Discussed worsening breathing and when to seek medical assistance.  He verbalized understanding. He denies any questions or concerns.    Plan: RN CM will outreach patient again in two weeks and patient agreeable.    Robert Baseman, RN, MSN [facility] Care Management Care Management Coordinator Direct Line [phone] Toll Free: [phone]  Fax: [phone]

## 2019-07-15 DIAGNOSIS — Z6829 Body mass index (BMI) 29.0-29.9, adult: Secondary | ICD-10-CM | POA: Diagnosis not present

## 2019-07-15 DIAGNOSIS — R201 Hypoesthesia of skin: Secondary | ICD-10-CM | POA: Diagnosis not present

## 2019-07-15 DIAGNOSIS — J449 Chronic obstructive pulmonary disease, unspecified: Secondary | ICD-10-CM | POA: Diagnosis not present

## 2019-07-15 DIAGNOSIS — B351 Tinea unguium: Secondary | ICD-10-CM | POA: Diagnosis not present

## 2019-07-15 DIAGNOSIS — E1129 Type 2 diabetes mellitus with other diabetic kidney complication: Secondary | ICD-10-CM | POA: Diagnosis not present

## 2019-07-16 DIAGNOSIS — I081 Rheumatic disorders of both mitral and tricuspid valves: Secondary | ICD-10-CM | POA: Diagnosis not present

## 2019-07-16 DIAGNOSIS — I130 Hypertensive heart and chronic kidney disease with heart failure and stage 1 through stage 4 chronic kidney disease, or unspecified chronic kidney disease: Secondary | ICD-10-CM | POA: Diagnosis not present

## 2019-07-16 DIAGNOSIS — E1122 Type 2 diabetes mellitus with diabetic chronic kidney disease: Secondary | ICD-10-CM | POA: Diagnosis not present

## 2019-07-16 DIAGNOSIS — L89312 Pressure ulcer of right buttock, stage 2: Secondary | ICD-10-CM | POA: Diagnosis not present

## 2019-07-16 DIAGNOSIS — A419 Sepsis, unspecified organism: Secondary | ICD-10-CM | POA: Diagnosis not present

## 2019-07-16 DIAGNOSIS — D631 Anemia in chronic kidney disease: Secondary | ICD-10-CM | POA: Diagnosis not present

## 2019-07-16 DIAGNOSIS — I5043 Acute on chronic combined systolic (congestive) and diastolic (congestive) heart failure: Secondary | ICD-10-CM | POA: Diagnosis not present

## 2019-07-16 DIAGNOSIS — I0981 Rheumatic heart failure: Secondary | ICD-10-CM | POA: Diagnosis not present

## 2019-07-16 DIAGNOSIS — N183 Chronic kidney disease, stage 3 unspecified: Secondary | ICD-10-CM | POA: Diagnosis not present

## 2019-07-17 DIAGNOSIS — S81801A Unspecified open wound, right lower leg, initial encounter: Secondary | ICD-10-CM | POA: Diagnosis not present

## 2019-07-17 DIAGNOSIS — L89319 Pressure ulcer of right buttock, unspecified stage: Secondary | ICD-10-CM | POA: Diagnosis not present

## 2019-07-17 DIAGNOSIS — L89159 Pressure ulcer of sacral region, unspecified stage: Secondary | ICD-10-CM | POA: Diagnosis not present

## 2019-07-17 DIAGNOSIS — L89329 Pressure ulcer of left buttock, unspecified stage: Secondary | ICD-10-CM | POA: Diagnosis not present

## 2019-07-18 DIAGNOSIS — I5043 Acute on chronic combined systolic (congestive) and diastolic (congestive) heart failure: Secondary | ICD-10-CM | POA: Diagnosis not present

## 2019-07-18 DIAGNOSIS — I0981 Rheumatic heart failure: Secondary | ICD-10-CM | POA: Diagnosis not present

## 2019-07-18 DIAGNOSIS — E1122 Type 2 diabetes mellitus with diabetic chronic kidney disease: Secondary | ICD-10-CM | POA: Diagnosis not present

## 2019-07-18 DIAGNOSIS — N183 Chronic kidney disease, stage 3 unspecified: Secondary | ICD-10-CM | POA: Diagnosis not present

## 2019-07-18 DIAGNOSIS — L89312 Pressure ulcer of right buttock, stage 2: Secondary | ICD-10-CM | POA: Diagnosis not present

## 2019-07-18 DIAGNOSIS — I130 Hypertensive heart and chronic kidney disease with heart failure and stage 1 through stage 4 chronic kidney disease, or unspecified chronic kidney disease: Secondary | ICD-10-CM | POA: Diagnosis not present

## 2019-07-18 DIAGNOSIS — A419 Sepsis, unspecified organism: Secondary | ICD-10-CM | POA: Diagnosis not present

## 2019-07-18 DIAGNOSIS — D631 Anemia in chronic kidney disease: Secondary | ICD-10-CM | POA: Diagnosis not present

## 2019-07-18 DIAGNOSIS — I081 Rheumatic disorders of both mitral and tricuspid valves: Secondary | ICD-10-CM | POA: Diagnosis not present

## 2019-07-21 DIAGNOSIS — N183 Chronic kidney disease, stage 3 unspecified: Secondary | ICD-10-CM | POA: Diagnosis not present

## 2019-07-21 DIAGNOSIS — I081 Rheumatic disorders of both mitral and tricuspid valves: Secondary | ICD-10-CM | POA: Diagnosis not present

## 2019-07-21 DIAGNOSIS — A419 Sepsis, unspecified organism: Secondary | ICD-10-CM | POA: Diagnosis not present

## 2019-07-21 DIAGNOSIS — D631 Anemia in chronic kidney disease: Secondary | ICD-10-CM | POA: Diagnosis not present

## 2019-07-21 DIAGNOSIS — I5043 Acute on chronic combined systolic (congestive) and diastolic (congestive) heart failure: Secondary | ICD-10-CM | POA: Diagnosis not present

## 2019-07-21 DIAGNOSIS — L89312 Pressure ulcer of right buttock, stage 2: Secondary | ICD-10-CM | POA: Diagnosis not present

## 2019-07-21 DIAGNOSIS — I130 Hypertensive heart and chronic kidney disease with heart failure and stage 1 through stage 4 chronic kidney disease, or unspecified chronic kidney disease: Secondary | ICD-10-CM | POA: Diagnosis not present

## 2019-07-21 DIAGNOSIS — E1122 Type 2 diabetes mellitus with diabetic chronic kidney disease: Secondary | ICD-10-CM | POA: Diagnosis not present

## 2019-07-21 DIAGNOSIS — I0981 Rheumatic heart failure: Secondary | ICD-10-CM | POA: Diagnosis not present

## 2019-07-22 ENCOUNTER — Ambulatory Visit: Payer: Medicare HMO

## 2019-07-28 ENCOUNTER — Other Ambulatory Visit: Payer: Self-pay

## 2019-07-28 NOTE — Patient Outreach (Signed)
[place] [facility]) Care Management  [place]  07/28/2019   Robert [name] 05-16-1951 [id number]  Subjective: Telephone call to patient for follow up.  Patient reports he is doing good.  Home health continues to evaluate wound to sacrum  Patient reports that his daughter changed it yesterday and said it is looking good. He states he has not seen it in about 2 weeks as daughter will take a picture sometime of the area and show it to him. He continues to get around with a cane.  He denies any problems with his breathing. Discussed his COPD and worsening symptoms.  He verbalized understanding and denies any needs.    Objective:   Encounter Medications:  Outpatient Encounter Medications as of 07/28/2019  Medication Sig  . acarbose (PRECOSE) 100 MG tablet Take 1 tablet (100 mg total) by mouth 3 (three) times daily with meals.  [name] acetaminophen (TYLENOL) 650 MG CR tablet Take 650 mg by mouth every 8 (eight) hours as needed for pain.  [name] ALPRAZolam (XANAX) 0.5 MG tablet Take 1 tablet (0.5 mg total) by mouth 3 (three) times daily as needed for anxiety or sleep.  [name] amoxicillin-clavulanate (AUGMENTIN) 875-125 MG tablet Take 1 tablet by mouth every 12 (twelve) hours.  [name] apixaban (ELIQUIS) 5 MG TABS tablet Take 1 tablet (5 mg total) by mouth 2 (two) times daily. (Patient not taking: Reported on 07/04/2019)  . atorvastatin (LIPITOR) 80 MG tablet Take 1 tablet (80 mg total) by mouth daily at 6 PM.  . clopidogrel (PLAVIX) 75 MG tablet TAKE (1) TABLET BY MOUTH ONCE DAILY. (Patient taking differently: Take 75 mg by mouth daily. TAKE (1) TABLET BY MOUTH ONCE DAILY.)  . collagenase (SANTYL) ointment Apply topically daily. And cover with damp to dry dressing daily. Change more frequently if soiled  . fish oil-omega-3 fatty acids 1000 MG capsule Take 1 g by mouth daily.   . folic acid (FOLVITE) 1 MG tablet Take 1 tablet (1 mg total) by mouth daily.  . furosemide (LASIX) 40 MG tablet Take 1  tablet (40 mg total) by mouth daily.  [name] gabapentin (NEURONTIN) 100 MG capsule Take 2 capsules (200 mg total) by mouth every 8 (eight) hours. (Patient taking differently: Take 300 mg by mouth every 8 (eight) hours. )  . glimepiride (AMARYL) 2 MG tablet Take 1 tablet (2 mg total) by mouth daily with breakfast.  . metoprolol tartrate (LOPRESSOR) 50 MG tablet Take 1 tablet (50 mg total) by mouth 2 (two) times daily. (Patient taking differently: Take 25 mg by mouth 2 (two) times daily. )  . mometasone-formoterol (DULERA) 200-5 MCG/ACT AERO Inhale 2 puffs into the lungs 2 (two) times daily.  . nitroGLYCERIN (NITROSTAT) 0.4 MG SL tablet Place 1 tablet (0.4 mg total) under the tongue every 5 (five) minutes x 3 doses as needed for chest pain.  [name] nystatin (MYCOSTATIN/NYSTOP) powder Apply topically 3 (three) times daily. (Patient taking differently: Apply 1 g topically daily as needed. )  . potassium chloride SA (K-DUR) 20 MEQ tablet Take 1 tablet (20 mEq total) by mouth 2 (two) times daily.  . tamsulosin (FLOMAX) 0.4 MG CAPS capsule Take 2 capsules (0.8 mg total) by mouth daily after supper.  . vitamin C (VITAMIN C) 250 MG tablet Take 1 tablet (250 mg total) by mouth 2 (two) times daily.   No facility-administered encounter medications on file as of 07/28/2019.     Functional Status:  In your present state of health, do you  have any difficulty performing the following activities: 07/08/2019 07/04/2019  Hearing? N -  Vision? N -  Difficulty concentrating or making decisions? N -  Walking or climbing stairs? N -  Comment - -  Dressing or bathing? N -  Comment - -  Doing errands, shopping? N N  Preparing Food and eating ? N -  Using the Toilet? N -  In the past six months, have you accidently leaked urine? N -  Do you have problems with loss of bowel control? N -  Managing your Medications? N -  Managing your Finances? N -  Housekeeping or managing your Housekeeping? N -  Some recent data might be  hidden    Fall/Depression Screening: Fall Risk  07/08/2019 06/20/2019  Falls in the past year? 0 0   PHQ 2/9 Scores 07/08/2019 06/20/2019  PHQ - 2 Score 0 0    Assessment:  Patient continues to manage disease processes. No re-admit to hospital.  Plan:  [facility] CM Care Plan Problem One     Most Recent Value  Care Plan Problem One  Recent hospitalization related to COPD/pneumonia  Role Documenting the Problem One  Care Management Telephonic [place] for Problem One  Active  [facility] Long Term Goal   Patient will not readmit to hospital with COPD/pneumonia within the next 31 days.  [facility] Long Term Goal Start Date  07/08/19  Interventions for Problem One Long Term Goal  RN CM retitrated signs of COPD worsening and use of inhalers as prescribed.   [facility] CM Short Term Goal #1   Patient will follow up with PCP within 14 days  [facility] CM Short Term Goal #1 Start Date  07/08/19  [facility] CM Short Term Goal #1 Met Date  07/14/19  [facility] CM Short Term Goal #2   Patient will have home health services restarted within the next 14 days  [facility] CM Short Term Goal #2 Start Date  07/08/19  [facility] CM Short Term Goal #2 Met Date  07/14/19     RN CM will contact patient in the month of December and patient agreeable.    Jone Baseman, RN, MSN [place] Management Care Management Coordinator Direct Line [phone] Cell [phone] Toll Free: [phone]  Fax: [phone]

## 2019-07-30 DIAGNOSIS — I081 Rheumatic disorders of both mitral and tricuspid valves: Secondary | ICD-10-CM | POA: Diagnosis not present

## 2019-07-30 DIAGNOSIS — E1122 Type 2 diabetes mellitus with diabetic chronic kidney disease: Secondary | ICD-10-CM | POA: Diagnosis not present

## 2019-07-30 DIAGNOSIS — A419 Sepsis, unspecified organism: Secondary | ICD-10-CM | POA: Diagnosis not present

## 2019-07-30 DIAGNOSIS — I130 Hypertensive heart and chronic kidney disease with heart failure and stage 1 through stage 4 chronic kidney disease, or unspecified chronic kidney disease: Secondary | ICD-10-CM | POA: Diagnosis not present

## 2019-07-30 DIAGNOSIS — N183 Chronic kidney disease, stage 3 unspecified: Secondary | ICD-10-CM | POA: Diagnosis not present

## 2019-07-30 DIAGNOSIS — E119 Type 2 diabetes mellitus without complications: Secondary | ICD-10-CM | POA: Diagnosis not present

## 2019-07-30 DIAGNOSIS — L89312 Pressure ulcer of right buttock, stage 2: Secondary | ICD-10-CM | POA: Diagnosis not present

## 2019-07-30 DIAGNOSIS — I0981 Rheumatic heart failure: Secondary | ICD-10-CM | POA: Diagnosis not present

## 2019-07-30 DIAGNOSIS — D631 Anemia in chronic kidney disease: Secondary | ICD-10-CM | POA: Diagnosis not present

## 2019-07-30 DIAGNOSIS — I5043 Acute on chronic combined systolic (congestive) and diastolic (congestive) heart failure: Secondary | ICD-10-CM | POA: Diagnosis not present

## 2019-08-01 ENCOUNTER — Telehealth: Payer: Self-pay | Admitting: *Deleted

## 2019-08-01 NOTE — Telephone Encounter (Signed)
[facility] Research Study:  Called patient to check on his status. He was recently in the hospital 07-03-19---07-07-2019. He stated that his son had fixed some grits and he was eating he got strangled and according to the doctors he aspirated and that's was what caused the aspiration pneumonia. He was in the hospital 5 days and discharged on antibiotics.He states that he did have some dysphagia only while in hospital but that has completed resolved. He states that he did not take any of his study drug while in the hospital. He restarted study drug 07/08/2019. He also stated that he started in new study drug IP kit the day I called him (06/18/2019/).  (PI Dr. Irish Lack updated.)

## 2019-08-04 ENCOUNTER — Encounter: Payer: Medicare HMO | Attending: Registered Nurse | Admitting: Physical Medicine & Rehabilitation

## 2019-08-04 ENCOUNTER — Other Ambulatory Visit: Payer: Self-pay

## 2019-08-04 ENCOUNTER — Encounter: Payer: Self-pay | Admitting: Physical Medicine & Rehabilitation

## 2019-08-04 VITALS — BP 136/81 | HR 85 | Temp 97.7°F | Ht 72.0 in | Wt 216.6 lb

## 2019-08-04 DIAGNOSIS — G8929 Other chronic pain: Secondary | ICD-10-CM | POA: Diagnosis not present

## 2019-08-04 DIAGNOSIS — R5381 Other malaise: Secondary | ICD-10-CM | POA: Diagnosis not present

## 2019-08-04 DIAGNOSIS — E1165 Type 2 diabetes mellitus with hyperglycemia: Secondary | ICD-10-CM | POA: Diagnosis not present

## 2019-08-04 DIAGNOSIS — M25561 Pain in right knee: Secondary | ICD-10-CM | POA: Insufficient documentation

## 2019-08-04 DIAGNOSIS — F064 Anxiety disorder due to known physiological condition: Secondary | ICD-10-CM | POA: Insufficient documentation

## 2019-08-04 DIAGNOSIS — M25562 Pain in left knee: Secondary | ICD-10-CM | POA: Insufficient documentation

## 2019-08-04 DIAGNOSIS — R339 Retention of urine, unspecified: Secondary | ICD-10-CM | POA: Diagnosis not present

## 2019-08-04 DIAGNOSIS — R269 Unspecified abnormalities of gait and mobility: Secondary | ICD-10-CM | POA: Diagnosis not present

## 2019-08-04 MED ORDER — DICLOFENAC SODIUM 1 % EX GEL: 2.0000 g | Freq: Four times a day (QID) | CUTANEOUS | 1 refills | Status: DC

## 2019-08-04 NOTE — Progress Notes (Signed)
Subjective:    Patient ID: Robert [name], male    DOB: Nov 09, 1950, 68 y.o.   MRN: [id number]  HPI Male with history of CAD, ICM, PAD, SCI with LLE weakness, T2DM, RA- on methotrexate/actemra, anxiety disorder, COPD presents for follow up for debility post Covid.  Last clinic visit on 06/20/2019 with NP, notes reviewed.  Since that time, he was admitted for aspiration pneumonitis.  Notes reviewed. Patient states he is volitionally urinating without difficulty. Buttock wound is healing and he is following with wound care. He has followed up with PCP. He sees Cards in Jan. He sees Urology in 2 weeks. CBGs have been controlled. He notes b/l knee pain. Denies swallowing difficulties. Denies fall.   Therapies: Completed, cont HEP Mobility: Quad cane at times.   Pain Inventory Average Pain 3 Pain Right Now 2 My pain is aching  In the last 24 hours, has pain interfered with the following? General activity 0 Relation with others 0 Enjoyment of life 0 What TIME of day is your pain at its worst? morning Sleep (in general) Good  Pain is worse with: walking and bending Pain improves with: rest Relief from Meds: 0  Mobility use a cane ability to climb steps?  yes  Function retired  Neuro/Psych trouble walking  Prior Studies Any changes since last visit?  yes  Physicians involved in your care Any changes since last visit?  no   Family History  Problem Relation Age of Onset  . Heart disease Father   . Colon cancer Brother        early 49s  . Hypertension Mother   . Heart disease Mother   . Colon cancer Mother        died age 95. late 28s early 4s at diagnosis.   . Lung cancer Mother    Social History   Socioeconomic History  . Marital status: Widowed    Spouse name: Not on file  . Number of children: Not on file  . Years of education: Not on file  . Highest education level: Not on file  Occupational History  . Occupation: Clinical biochemist  Social Needs  .  Financial resource strain: Not on file  . Food insecurity    Worry: Never true    Inability: Never true  . Transportation needs    Medical: No    Non-medical: No  Tobacco Use  . Smoking status: Current Some Day Smoker    Packs/day: 1.00    Types: Cigarettes    Last attempt to quit: 06/11/2015    Years since quitting: 4.1  . Smokeless tobacco: Never Used  . Tobacco comment: pt states smoking less than half a pack  Substance and Sexual Activity  . Alcohol use: No  . Drug use: No  . Sexual activity: Yes  Lifestyle  . Physical activity    Days per week: Not on file    Minutes per session: Not on file  . Stress: Not on file  Relationships  . Social Herbalist on phone: Not on file    Gets together: Not on file    Attends religious service: Not on file    Active member of club or organization: Not on file    Attends meetings of clubs or organizations: Not on file    Relationship status: Not on file  Other Topics Concern  . Not on file  Social History Narrative  . Not on file   Past Surgical History:  Procedure Laterality Date  . CARDIAC CATHETERIZATION N/A 02/01/2015   Procedure: Left Heart Cath and Coronary Angiography;  Surgeon: Belva Crome, MD;  Location: [place] [facility];  Service: Cardiovascular;  Laterality: N/A;  . CARDIAC CATHETERIZATION N/A 03/22/2015   Procedure: Left Heart Cath and Coronary Angiography;  Surgeon: Leonie Man, MD;  Location: [place] [facility];  Service: Cardiovascular;  Laterality: N/A;  . CARDIAC CATHETERIZATION  09/05/2002; 03/09/2003; 04/10/2005;06/02/2005; 05/09/2006; 02/09/2007; 03/01/2007  . COLONOSCOPY  2008   Dr. Gala Romney: diverticulosis, hyperplastic polyp.  . CORONARY ANGIOPLASTY  11/14/2012; 02/01/2015  . CORONARY ANGIOPLASTY WITH STENT PLACEMENT  05/16/2012   "1; makes total ~ 4"  . CORONARY STENT INTERVENTION N/A 06/06/2017   Procedure: CORONARY STENT INTERVENTION;  Surgeon: Belva Crome, MD;  Location: [place] [facility];   Service: Cardiovascular;  Laterality: N/A;  . INSERTION OF MESH Bilateral 05/24/2016   Procedure: INSERTION OF MESH;  Surgeon: Coralie Keens, MD;  Location: [place];  Service: General;  Laterality: Bilateral;  . LAPAROSCOPIC CHOLECYSTECTOMY    . LAPAROSCOPIC INGUINAL HERNIA WITH UMBILICAL HERNIA Bilateral 05/24/2016   Procedure: BILATERAL LAPAROSCOPIC INGUINAL HERNIA REPAIR WITH MESH AND UMBILICAL HERNIA REPAIR WITH MESH;  Surgeon: Coralie Keens, MD;  Location: [place];  Service: General;  Laterality: Bilateral;  . LEFT HEART CATH AND CORONARY ANGIOGRAPHY N/A 06/06/2017   Procedure: LEFT HEART CATH AND CORONARY ANGIOGRAPHY;  Surgeon: Belva Crome, MD;  Location: [place] [facility];  Service: Cardiovascular;  Laterality: N/A;  . LEFT HEART CATHETERIZATION WITH CORONARY ANGIOGRAM N/A 12/22/2011   Procedure: LEFT HEART CATHETERIZATION WITH CORONARY ANGIOGRAM;  Surgeon: Troy Sine, MD;  Location: [facility] CATH LAB;  Service: Cardiovascular;  Laterality: N/A;  . LEFT HEART CATHETERIZATION WITH CORONARY ANGIOGRAM Bilateral 05/16/2012   Procedure: LEFT HEART CATHETERIZATION WITH CORONARY ANGIOGRAM;  Surgeon: Lorretta Harp, MD;  Location: [facility] CATH LAB;  Service: Cardiovascular;  Laterality: Bilateral;  . LEFT HEART CATHETERIZATION WITH CORONARY ANGIOGRAM N/A 11/14/2012   Procedure: LEFT HEART CATHETERIZATION WITH CORONARY ANGIOGRAM;  Surgeon: Troy Sine, MD;  Location: [facility] CATH LAB;  Service: Cardiovascular;  Laterality: N/A;  . LEFT HEART CATHETERIZATION WITH CORONARY ANGIOGRAM N/A 09/01/2013   Procedure: LEFT HEART CATHETERIZATION WITH CORONARY ANGIOGRAM;  Surgeon: Leonie Man, MD;  Location: [facility] CATH LAB;  Service: Cardiovascular;  Laterality: N/A;  . [place]; 1973; 1985   x 3  . NM MYOCAR PERF WALL MOTION  08/30/2009   No significant ischemia  . OPEN REDUCTION INTERNAL FIXATION (ORIF) DISTAL RADIAL FRACTURE Right 12/10/2016   Procedure: OPEN  REDUCTION INTERNAL FIXATION (ORIF) DISTAL RADIAL FRACTURE;  Surgeon: Iran Planas, MD;  Location: [place];  Service: Orthopedics;  Laterality: Right;  . PERCUTANEOUS CORONARY INTERVENTION-BALLOON ONLY  12/22/2011   Procedure: PERCUTANEOUS CORONARY INTERVENTION-BALLOON ONLY;  Surgeon: Troy Sine, MD;  Location: [facility] CATH LAB;  Service: Cardiovascular;;  . PERCUTANEOUS CORONARY INTERVENTION-BALLOON ONLY  11/14/2012   Procedure: PERCUTANEOUS CORONARY INTERVENTION-BALLOON ONLY;  Surgeon: Troy Sine, MD;  Location: [facility] CATH LAB;  Service: Cardiovascular;;  . PERCUTANEOUS CORONARY STENT INTERVENTION (PCI-S)  05/16/2012   Procedure: PERCUTANEOUS CORONARY STENT INTERVENTION (PCI-S);  Surgeon: Lorretta Harp, MD;  Location: [facility] CATH LAB;  Service: Cardiovascular;;  . PERCUTANEOUS CORONARY STENT INTERVENTION (PCI-S)  09/01/2013   Procedure: PERCUTANEOUS CORONARY STENT INTERVENTION (PCI-S);  Surgeon: Leonie Man, MD;  Location: [facility] CATH LAB;  Service: Cardiovascular;;   Past Medical History:  Diagnosis Date  .  Anxiety   . CHF (congestive heart failure) ([place])   . Collagen vascular disease ([place])   . COPD (chronic obstructive pulmonary disease) ([place])   . Coronary artery disease   . COVID-19   . Full dentures   . GERD (gastroesophageal reflux disease)    Rolaids as needed  . High cholesterol   . History of MI (myocardial infarction)   . Hypertension    med. dosage increased 04/2016; has been on BP med. x 10 yrs.  . Incarcerated umbilical hernia 0000000  . Inguinal hernia 05/2016   bilateral   . Insulin dependent diabetes mellitus   . Ischemic cardiomyopathy    a. 03/2015 EF 40-45% by LV gram.  . Left leg cellulitis 10/08/2016  . Rheumatoid arthritis(714.0)    BP 136/81   Pulse 85   Temp 97.7 F (36.5 C)   Ht 6' (1.829 m)   Wt 216 lb 9.6 oz (98.2 kg)   SpO2 94%   BMI 29.38 kg/m   Opioid Risk Score:   Fall Risk Score:  `1  Depression screen PHQ 2/9  Depression screen [facility] 2/9 07/08/2019  06/20/2019  Decreased Interest 0 0  Down, Depressed, Hopeless 0 0  PHQ - 2 Score 0 0   Review of Systems  Constitutional: Negative for fever.  Musculoskeletal: Positive for arthralgias and gait problem.  Neurological: Positive for numbness. Negative for weakness.  All other systems reviewed and are negative.      Objective:   Physical Exam Constitutional: No distress . Vital signs reviewed. HENT: Normocephalic.  Atraumatic. Eyes: EOMI. No discharge. Cardiovascular: No JVD. Respiratory: Normal effort.  No stridor. GI: Non-distended. Skin: Sacral ulcer not examined Psych: Normal mood.  Normal behavior. Musc: No edema in extremities.  No tenderness in extremities. Neurological:  Alert Motor: Bilateral upper extremities: 4+-5/5 proximal to distal Right lower extremity: 4+/5 hip flexion, 4+/5 knee extension, 4+/5 ankle dorsiflexion Left lower extremity: 4+/5 hip flexion, knee extension,  3-5 ankle dorsiflexion Vascular change LLE    Assessment & Plan:  Male with history of CAD, ICM, PAD, SCI with LLE weakness, T2DM, RA- on methotrexate/actemra, anxiety disorder, COPD presents for follow up for debility post Covid.  1.  Functional and mobility deficits secondary to debility after Covid respiratory failure  Cont HEP  2. Pain Management:   Related to RA b/l knees  Will order xrays of knees  Discussed potential injections if necessary  3. Sacral decub/Skin/Wound Care:              Healing  Cont follow up with wound care center  4. Urinary retention:    Resolved  5. Gait abnormality  Cont HEP  Cont cane for safety PRN

## 2019-08-06 DIAGNOSIS — D631 Anemia in chronic kidney disease: Secondary | ICD-10-CM | POA: Diagnosis not present

## 2019-08-06 DIAGNOSIS — I0981 Rheumatic heart failure: Secondary | ICD-10-CM | POA: Diagnosis not present

## 2019-08-06 DIAGNOSIS — L89312 Pressure ulcer of right buttock, stage 2: Secondary | ICD-10-CM | POA: Diagnosis not present

## 2019-08-06 DIAGNOSIS — I130 Hypertensive heart and chronic kidney disease with heart failure and stage 1 through stage 4 chronic kidney disease, or unspecified chronic kidney disease: Secondary | ICD-10-CM | POA: Diagnosis not present

## 2019-08-06 DIAGNOSIS — I5043 Acute on chronic combined systolic (congestive) and diastolic (congestive) heart failure: Secondary | ICD-10-CM | POA: Diagnosis not present

## 2019-08-06 DIAGNOSIS — A419 Sepsis, unspecified organism: Secondary | ICD-10-CM | POA: Diagnosis not present

## 2019-08-06 DIAGNOSIS — E1122 Type 2 diabetes mellitus with diabetic chronic kidney disease: Secondary | ICD-10-CM | POA: Diagnosis not present

## 2019-08-06 DIAGNOSIS — I081 Rheumatic disorders of both mitral and tricuspid valves: Secondary | ICD-10-CM | POA: Diagnosis not present

## 2019-08-06 DIAGNOSIS — N183 Chronic kidney disease, stage 3 unspecified: Secondary | ICD-10-CM | POA: Diagnosis not present

## 2019-08-10 DIAGNOSIS — J449 Chronic obstructive pulmonary disease, unspecified: Secondary | ICD-10-CM | POA: Diagnosis not present

## 2019-08-11 DIAGNOSIS — I5042 Chronic combined systolic (congestive) and diastolic (congestive) heart failure: Secondary | ICD-10-CM | POA: Diagnosis not present

## 2019-08-11 DIAGNOSIS — I130 Hypertensive heart and chronic kidney disease with heart failure and stage 1 through stage 4 chronic kidney disease, or unspecified chronic kidney disease: Secondary | ICD-10-CM | POA: Diagnosis not present

## 2019-08-11 DIAGNOSIS — E1122 Type 2 diabetes mellitus with diabetic chronic kidney disease: Secondary | ICD-10-CM | POA: Diagnosis not present

## 2019-08-11 DIAGNOSIS — I2510 Atherosclerotic heart disease of native coronary artery without angina pectoris: Secondary | ICD-10-CM | POA: Diagnosis not present

## 2019-08-12 ENCOUNTER — Other Ambulatory Visit: Payer: Self-pay

## 2019-08-12 ENCOUNTER — Ambulatory Visit (HOSPITAL_COMMUNITY)
Admission: RE | Admit: 2019-08-12 | Discharge: 2019-08-12 | Disposition: A | Discharge status: Home / Self Care | Payer: Medicare HMO | Source: Ambulatory Visit | Attending: Physical Medicine & Rehabilitation | Admitting: Physical Medicine & Rehabilitation

## 2019-08-12 DIAGNOSIS — M1712 Unilateral primary osteoarthritis, left knee: Secondary | ICD-10-CM | POA: Diagnosis not present

## 2019-08-12 DIAGNOSIS — M25562 Pain in left knee: Secondary | ICD-10-CM | POA: Insufficient documentation

## 2019-08-12 DIAGNOSIS — G8929 Other chronic pain: Secondary | ICD-10-CM | POA: Diagnosis not present

## 2019-08-12 DIAGNOSIS — M1711 Unilateral primary osteoarthritis, right knee: Secondary | ICD-10-CM | POA: Diagnosis not present

## 2019-08-12 DIAGNOSIS — M25561 Pain in right knee: Secondary | ICD-10-CM | POA: Diagnosis not present

## 2019-08-14 DIAGNOSIS — Z872 Personal history of diseases of the skin and subcutaneous tissue: Secondary | ICD-10-CM | POA: Diagnosis not present

## 2019-08-14 DIAGNOSIS — Z8619 Personal history of other infectious and parasitic diseases: Secondary | ICD-10-CM | POA: Diagnosis not present

## 2019-08-14 DIAGNOSIS — L89319 Pressure ulcer of right buttock, unspecified stage: Secondary | ICD-10-CM | POA: Diagnosis not present

## 2019-08-19 ENCOUNTER — Other Ambulatory Visit: Payer: Self-pay

## 2019-08-19 NOTE — Patient Outreach (Signed)
[employer] [facility]) Care Management  Robert [name]  08/19/2019   Robert [name] 10-25-1950 [id number]  Subjective: Telephone call to patient for follow up.  Patient reports he is doing ok.  He reports that his buttock area is healed. Discussed continuing to monitor area in order to make sure area does not re-open.  He verbalized understanding.  Patient reports he continues to manage his COPD and that he has no changes in his breathing.  Discussed signs of changes and when to notify physician. Patient to see Dr. Gerarda Fraction next week to discuss his balance.  He denies falls but he feels his balance is off.  Patient reports some knee pain as well and will get cortisone injections next week.  Discussed his sugars and the cortisone shots.  He states he will discuss sugars and cortisone shot with Dr. Gerarda Fraction.  Patient denies any needs.    Objective:   Encounter Medications:  Outpatient Encounter Medications as of 08/19/2019  Medication Sig  . acarbose (PRECOSE) 100 MG tablet Take 1 tablet (100 mg total) by mouth 3 (three) times daily with meals.  [name] acetaminophen (TYLENOL) 650 MG CR tablet Take 650 mg by mouth every 8 (eight) hours as needed for pain.  [name] ALPRAZolam (XANAX) 0.5 MG tablet Take 1 tablet (0.5 mg total) by mouth 3 (three) times daily as needed for anxiety or sleep.  [name] amoxicillin-clavulanate (AUGMENTIN) 875-125 MG tablet Take 1 tablet by mouth every 12 (twelve) hours.  [name] apixaban (ELIQUIS) 5 MG TABS tablet Take 1 tablet (5 mg total) by mouth 2 (two) times daily. (Patient not taking: Reported on 07/04/2019)  . atorvastatin (LIPITOR) 80 MG tablet Take 1 tablet (80 mg total) by mouth daily at 6 PM.  . clopidogrel (PLAVIX) 75 MG tablet TAKE (1) TABLET BY MOUTH ONCE DAILY. (Patient taking differently: Take 75 mg by mouth daily. TAKE (1) TABLET BY MOUTH ONCE DAILY.)  . collagenase (SANTYL) ointment Apply topically daily. And cover with damp to dry dressing daily. Change more frequently if  soiled  . diclofenac Sodium (VOLTAREN) 1 % GEL Apply 2 g topically 4 (four) times daily.  . fish oil-omega-3 fatty acids 1000 MG capsule Take 1 g by mouth daily.   . folic acid (FOLVITE) 1 MG tablet Take 1 tablet (1 mg total) by mouth daily.  . furosemide (LASIX) 40 MG tablet Take 1 tablet (40 mg total) by mouth daily.  [name] gabapentin (NEURONTIN) 100 MG capsule Take 2 capsules (200 mg total) by mouth every 8 (eight) hours. (Patient taking differently: Take 300 mg by mouth every 8 (eight) hours. )  . glimepiride (AMARYL) 2 MG tablet Take 1 tablet (2 mg total) by mouth daily with breakfast.  . metoprolol tartrate (LOPRESSOR) 50 MG tablet Take 1 tablet (50 mg total) by mouth 2 (two) times daily. (Patient taking differently: Take 25 mg by mouth 2 (two) times daily. )  . mometasone-formoterol (DULERA) 200-5 MCG/ACT AERO Inhale 2 puffs into the lungs 2 (two) times daily.  . nitroGLYCERIN (NITROSTAT) 0.4 MG SL tablet Place 1 tablet (0.4 mg total) under the tongue every 5 (five) minutes x 3 doses as needed for chest pain.  [name] nystatin (MYCOSTATIN/NYSTOP) powder Apply topically 3 (three) times daily. (Patient taking differently: Apply 1 g topically daily as needed. )  . potassium chloride SA (K-DUR) 20 MEQ tablet Take 1 tablet (20 mEq total) by mouth 2 (two) times daily.  . vitamin C (VITAMIN C) 250 MG tablet Take 1 tablet (  250 mg total) by mouth 2 (two) times daily.   No facility-administered encounter medications on file as of 08/19/2019.     Functional Status:  In your present state of health, do you have any difficulty performing the following activities: 07/08/2019 07/04/2019  Hearing? N -  Vision? N -  Difficulty concentrating or making decisions? N -  Walking or climbing stairs? N -  Comment - -  Dressing or bathing? N -  Comment - -  Doing errands, shopping? N N  Preparing Food and eating ? N -  Using the Toilet? N -  In the past six months, have you accidently leaked urine? N -  Do you have  problems with loss of bowel control? N -  Managing your Medications? N -  Managing your Finances? N -  Housekeeping or managing your Housekeeping? N -  Some recent data might be hidden    Fall/Depression Screening: Fall Risk  08/04/2019 07/08/2019 06/20/2019  Falls in the past year? 0 0 0   PHQ 2/9 Scores 07/08/2019 06/20/2019  PHQ - 2 Score 0 0    Assessment: Patient continues to manage chronic illnesses and no rehospitalization.    Plan:  [facility] CM Care Plan Problem One     Most Recent Value  Care Plan Problem One  Recent hospitalization related to COPD/pneumonia  Role Documenting the Problem One  Care Management Telephonic [place] for Problem One  Active  [facility] Long Term Goal   Patient will not readmit to hospital with COPD/pneumonia within the next 31 days.  [facility] Long Term Goal Start Date  07/08/19  [facility] Long Term Goal Met Date  08/19/19  [facility] CM Short Term Goal #1   Over the next 30 days, patient will demonstrate and/or verbalize understanding of self-health management for long term care of COPD  [facility] CM Short Term Goal #1 Start Date  08/19/19  Interventions for Short Term Goal #1  RN CM reviewed with patient ways of monitoring COPD, signs & symptoms, and when to notify physician.     RN CM will contact in the month of January and patient agreeable.    Jone Baseman, RN, MSN [place] [place] [facility] Care Management Coordinator Direct Line [phone] Cell [phone] Toll Free: [phone]  Fax: [phone]

## 2019-08-22 ENCOUNTER — Telehealth: Payer: Self-pay | Admitting: Cardiovascular Disease

## 2019-08-22 NOTE — Telephone Encounter (Signed)
Robert [name] has been made aware that the patient is no longer on Aspirin. The patient is aware.

## 2019-08-22 NOTE — Telephone Encounter (Signed)
The patient has been made aware that he should no longer be taking aspirin. He should be on the Plavix and Eliquis. He has verbalized his understanding.

## 2019-08-22 NOTE — Telephone Encounter (Signed)
  Robert [name] from [facility]  is asking if Robert [name] should be taking all three of aspirin 81 mg, apixaban (ELIQUIS) 5 MG TABS tablet and clopidogrel (PLAVIX) 75 MG tablet. She would like for our office to contact her and the patient.

## 2019-08-22 NOTE — Telephone Encounter (Signed)
Patient calling wanting to know if he should continue taking the Plavix and baby Asprin with the Eliquis he was prescribed in the hospital.

## 2019-08-25 DIAGNOSIS — Z6829 Body mass index (BMI) 29.0-29.9, adult: Secondary | ICD-10-CM | POA: Diagnosis not present

## 2019-08-25 DIAGNOSIS — H532 Diplopia: Secondary | ICD-10-CM | POA: Diagnosis not present

## 2019-08-25 DIAGNOSIS — E1129 Type 2 diabetes mellitus with other diabetic kidney complication: Secondary | ICD-10-CM | POA: Diagnosis not present

## 2019-08-25 DIAGNOSIS — R42 Dizziness and giddiness: Secondary | ICD-10-CM | POA: Diagnosis not present

## 2019-08-28 ENCOUNTER — Encounter: Payer: Medicare HMO | Attending: Registered Nurse | Admitting: Physical Medicine & Rehabilitation

## 2019-08-28 ENCOUNTER — Other Ambulatory Visit: Payer: Self-pay

## 2019-08-28 ENCOUNTER — Encounter: Payer: Self-pay | Admitting: Physical Medicine & Rehabilitation

## 2019-08-28 VITALS — BP 113/72 | HR 78 | Temp 97.9°F | Ht 72.0 in | Wt 219.0 lb

## 2019-08-28 DIAGNOSIS — F064 Anxiety disorder due to known physiological condition: Secondary | ICD-10-CM | POA: Diagnosis not present

## 2019-08-28 DIAGNOSIS — M25562 Pain in left knee: Secondary | ICD-10-CM | POA: Diagnosis not present

## 2019-08-28 DIAGNOSIS — M1711 Unilateral primary osteoarthritis, right knee: Secondary | ICD-10-CM | POA: Diagnosis not present

## 2019-08-28 DIAGNOSIS — R339 Retention of urine, unspecified: Secondary | ICD-10-CM | POA: Diagnosis not present

## 2019-08-28 DIAGNOSIS — R5381 Other malaise: Secondary | ICD-10-CM

## 2019-08-28 DIAGNOSIS — M25561 Pain in right knee: Secondary | ICD-10-CM | POA: Diagnosis not present

## 2019-08-28 DIAGNOSIS — R269 Unspecified abnormalities of gait and mobility: Secondary | ICD-10-CM | POA: Diagnosis not present

## 2019-08-28 DIAGNOSIS — G8929 Other chronic pain: Secondary | ICD-10-CM | POA: Insufficient documentation

## 2019-08-28 DIAGNOSIS — M2170 Unequal limb length (acquired), unspecified site: Secondary | ICD-10-CM | POA: Diagnosis not present

## 2019-08-28 DIAGNOSIS — E1165 Type 2 diabetes mellitus with hyperglycemia: Secondary | ICD-10-CM | POA: Diagnosis not present

## 2019-08-28 NOTE — Progress Notes (Addendum)
Subjective:    Patient ID: Robert [name], male    DOB: [birth year], 68 y.o.   MRN: [id number]  HPI Male with history of CAD, ICM, PAD, SCI with LLE weakness, T2DM, RA- on methotrexate/actemra, anxiety disorder, COPD presents for follow up for debility post Covid.  Last clinic visit on 08/04/2019.  Since that time, pt states he continues to do HEP.  Denies falls. He did obtain xrays of his knees. Sacral ulcer has healed. Using cane for ambulation.   Pain Inventory Average Pain 5 Pain Right Now 5 My pain is aching  In the last 24 hours, has pain interfered with the following? General activity 2 Relation with others 0 Enjoyment of life 0 What TIME of day is your pain at its worst? morning Sleep (in general) Good  Pain is worse with: walking and bending Pain improves with: rest Relief from Meds: 0  Mobility use a cane ability to climb steps?  yes  Function retired  Neuro/Psych trouble walking  Prior Studies Any changes since last visit?  yes  Physicians involved in your care Any changes since last visit?  no   Family History  Problem Relation Age of Onset  . Heart disease Father   . Colon cancer Brother        early 73s  . Hypertension Mother   . Heart disease Mother   . Colon cancer Mother        died age 73. late 12s early 51s at diagnosis.   . Lung cancer Mother    Social History   Socioeconomic History  . Marital status: Widowed    Spouse name: Not on file  . Number of children: Not on file  . Years of education: Not on file  . Highest education level: Not on file  Occupational History  . Occupation: Clinical biochemist  Tobacco Use  . Smoking status: Current Some Day Smoker    Packs/day: 1.00    Types: Cigarettes    Last attempt to quit: 06/11/2015    Years since quitting: 4.2  . Smokeless tobacco: Never Used  . Tobacco comment: pt states smoking less than half a pack  Substance and Sexual Activity  . Alcohol use: No  . Drug use: No  .  Sexual activity: Yes  Other Topics Concern  . Not on file  Social History Narrative  . Not on file   Social Determinants of Health   Financial Resource Strain:   . Difficulty of Paying Living Expenses: Not on file  Food Insecurity: No Food Insecurity  . Worried About Charity fundraiser in the Last Year: Never true  . Ran Out of Food in the Last Year: Never true  Transportation Needs: No Transportation Needs  . Lack of Transportation (Medical): No  . Lack of Transportation (Non-Medical): No  Physical Activity:   . Days of Exercise per Week: Not on file  . Minutes of Exercise per Session: Not on file  Stress:   . Feeling of Stress : Not on file  Social Connections:   . Frequency of Communication with Friends and Family: Not on file  . Frequency of Social Gatherings with Friends and Family: Not on file  . Attends Religious Services: Not on file  . Active Member of Clubs or Organizations: Not on file  . Attends Archivist Meetings: Not on file  . Marital Status: Not on file   Past Surgical History:  Procedure Laterality Date  . CARDIAC CATHETERIZATION N/A  02/01/2015   Procedure: Left Heart Cath and Coronary Angiography;  Surgeon: Belva Crome, MD;  Location: [facility];  Service: Cardiovascular;  Laterality: N/A;  . CARDIAC CATHETERIZATION N/A 03/22/2015   Procedure: Left Heart Cath and Coronary Angiography;  Surgeon: Leonie Man, MD;  Location: [place] [facility];  Service: Cardiovascular;  Laterality: N/A;  . CARDIAC CATHETERIZATION  09/05/2002; 03/09/2003; 04/10/2005;06/02/2005; 05/09/2006; 02/09/2007; 03/01/2007  . COLONOSCOPY  2008   Dr. Gala Romney: diverticulosis, hyperplastic polyp.  . CORONARY ANGIOPLASTY  11/14/2012; 02/01/2015  . CORONARY ANGIOPLASTY WITH STENT PLACEMENT  05/16/2012   "1; makes total ~ 4"  . CORONARY STENT INTERVENTION N/A 06/06/2017   Procedure: CORONARY STENT INTERVENTION;  Surgeon: Belva Crome, MD;  Location: [place] [facility];  Service:  Cardiovascular;  Laterality: N/A;  . INSERTION OF MESH Bilateral 05/24/2016   Procedure: INSERTION OF MESH;  Surgeon: Coralie Keens, MD;  Location: [place];  Service: General;  Laterality: Bilateral;  . LAPAROSCOPIC CHOLECYSTECTOMY    . LAPAROSCOPIC INGUINAL HERNIA WITH UMBILICAL HERNIA Bilateral 05/24/2016   Procedure: BILATERAL LAPAROSCOPIC INGUINAL HERNIA REPAIR WITH MESH AND UMBILICAL HERNIA REPAIR WITH MESH;  Surgeon: Coralie Keens, MD;  Location: [place];  Service: General;  Laterality: Bilateral;  . LEFT HEART CATH AND CORONARY ANGIOGRAPHY N/A 06/06/2017   Procedure: LEFT HEART CATH AND CORONARY ANGIOGRAPHY;  Surgeon: Belva Crome, MD;  Location: [place] [facility];  Service: Cardiovascular;  Laterality: N/A;  . LEFT HEART CATHETERIZATION WITH CORONARY ANGIOGRAM N/A 12/22/2011   Procedure: LEFT HEART CATHETERIZATION WITH CORONARY ANGIOGRAM;  Surgeon: Troy Sine, MD;  [facility] CATH LAB;  Service: Cardiovascular;  Laterality: N/A;  . LEFT HEART CATHETERIZATION WITH CORONARY ANGIOGRAM Bilateral 05/16/2012   Procedure: LEFT HEART CATHETERIZATION WITH CORONARY ANGIOGRAM;  Surgeon: Lorretta Harp, MD;  Location: [facility] CATH LAB;  Service: Cardiovascular;  Laterality: Bilateral;  . LEFT HEART CATHETERIZATION WITH CORONARY ANGIOGRAM N/A 11/14/2012   Procedure: LEFT HEART CATHETERIZATION WITH CORONARY ANGIOGRAM;  Surgeon: Troy Sine, MD;  Location: [facility] CATH LAB;  Service: Cardiovascular;  Laterality: N/A;  . LEFT HEART CATHETERIZATION WITH CORONARY ANGIOGRAM N/A 09/01/2013   Procedure: LEFT HEART CATHETERIZATION WITH CORONARY ANGIOGRAM;  Surgeon: Leonie Man, MD;  Location: [facility] CATH LAB;  Service: Cardiovascular;  Laterality: N/A;  . [place]; 1973; 1985   x 3  . NM MYOCAR PERF WALL MOTION  08/30/2009   No significant ischemia  . OPEN REDUCTION INTERNAL FIXATION (ORIF) DISTAL RADIAL FRACTURE Right 12/10/2016   Procedure: OPEN REDUCTION  INTERNAL FIXATION (ORIF) DISTAL RADIAL FRACTURE;  Surgeon: Iran Planas, MD;  Location: [place];  Service: Orthopedics;  Laterality: Right;  . PERCUTANEOUS CORONARY INTERVENTION-BALLOON ONLY  12/22/2011   Procedure: PERCUTANEOUS CORONARY INTERVENTION-BALLOON ONLY;  Surgeon: Troy Sine, MD;  Location: [facility] CATH LAB;  Service: Cardiovascular;;  . PERCUTANEOUS CORONARY INTERVENTION-BALLOON ONLY  11/14/2012   Procedure: PERCUTANEOUS CORONARY INTERVENTION-BALLOON ONLY;  Surgeon: Troy Sine, MD;  Location: [facility] CATH LAB;  Service: Cardiovascular;;  . PERCUTANEOUS CORONARY STENT INTERVENTION (PCI-S)  05/16/2012   Procedure: PERCUTANEOUS CORONARY STENT INTERVENTION (PCI-S);  Surgeon: Lorretta Harp, MD;  Location: [facility] CATH LAB;  Service: Cardiovascular;;  . PERCUTANEOUS CORONARY STENT INTERVENTION (PCI-S)  09/01/2013   Procedure: PERCUTANEOUS CORONARY STENT INTERVENTION (PCI-S);  Surgeon: Leonie Man, MD;  Location: [facility] CATH LAB;  Service: Cardiovascular;;   Past Medical History:  Diagnosis Date  . Anxiety   . CHF (congestive  heart failure) ([place])   . Collagen vascular disease ([place])   . COPD (chronic obstructive pulmonary disease) ([place])   . Coronary artery disease   . COVID-19   . Full dentures   . GERD (gastroesophageal reflux disease)    Rolaids as needed  . High cholesterol   . History of MI (myocardial infarction)   . Hypertension    med. dosage increased 04/2016; has been on BP med. x 10 yrs.  . Incarcerated umbilical hernia 0000000  . Inguinal hernia 05/2016   bilateral   . Insulin dependent diabetes mellitus   . Ischemic cardiomyopathy    a. 03/2015 EF 40-45% by LV gram.  . Left leg cellulitis 10/08/2016  . Rheumatoid arthritis(714.0)    BP 113/72   Pulse 78   Temp 97.9 F (36.6 C)   Ht 6' (1.829 m)   Wt 219 lb (99.3 kg)   SpO2 95%   BMI 29.70 kg/m   Opioid Risk Score:   Fall Risk Score:  `1  Depression screen PHQ 2/9  Depression screen [facility] 2/9 07/08/2019 06/20/2019    Decreased Interest 0 0  Down, Depressed, Hopeless 0 0  PHQ - 2 Score 0 0  Some recent data might be hidden   Review of Systems  Constitutional: Negative.   HENT: Negative.   Eyes: Negative.   Respiratory: Negative.   Cardiovascular: Negative.   Gastrointestinal: Negative.   Endocrine: Negative.   Genitourinary: Negative.   Musculoskeletal: Positive for arthralgias and gait problem.  Skin: Negative.   Allergic/Immunologic: Negative.   Hematological: Negative.   Psychiatric/Behavioral: Negative.       Objective:   Physical Exam  Constitutional: No distress . Vital signs reviewed. HENT: Normocephalic.  Atraumatic. Eyes: EOMI. No discharge. Cardiovascular: No JVD. Respiratory: Normal effort.  No stridor. GI: Non-distended. Skin: Warm and dry.  Intact. Psych: Normal mood.  Normal behavior. Musc: Right knee TTP +Leg length discrepancy on right Neurological:  Alert Motor: Bilateral upper extremities: 4+-5/5 proximal to distal Right lower extremity: 4+/5 hip flexion, 4+/5 knee extension, 4+/5 ankle dorsiflexion Left lower extremity: 4+/5 hip flexion, knee extension,  3-/5 ankle dorsiflexion Vascular change LLE    Assessment & Plan:  Male with history of CAD, ICM, PAD, SCI with LLE weakness, T2DM, RA- on methotrexate/actemra, anxiety disorder, COPD presents for follow up for debility post Covid.  1.  Functional and mobility deficits secondary to debility after Covid respiratory failure  Cont HEP  2. Pain Management:   Related to RA b/l knees  No benefit with Voltaren gel  Xrays of knees personally reviewed, right Mod OA, left Mild OA  Will schedule for Synvisc injection  Will refer for shoe lift for right leg  Trial compression sleeve to knee  3. Gait abnormality  Cont HEP  Cont cane for safety PRN

## 2019-09-09 DIAGNOSIS — J449 Chronic obstructive pulmonary disease, unspecified: Secondary | ICD-10-CM | POA: Diagnosis not present

## 2019-09-11 ENCOUNTER — Other Ambulatory Visit: Payer: Self-pay | Admitting: Physical Medicine and Rehabilitation

## 2019-09-11 DIAGNOSIS — I5042 Chronic combined systolic (congestive) and diastolic (congestive) heart failure: Secondary | ICD-10-CM | POA: Diagnosis not present

## 2019-09-11 DIAGNOSIS — I2510 Atherosclerotic heart disease of native coronary artery without angina pectoris: Secondary | ICD-10-CM | POA: Diagnosis not present

## 2019-09-11 DIAGNOSIS — I130 Hypertensive heart and chronic kidney disease with heart failure and stage 1 through stage 4 chronic kidney disease, or unspecified chronic kidney disease: Secondary | ICD-10-CM | POA: Diagnosis not present

## 2019-09-11 DIAGNOSIS — E1122 Type 2 diabetes mellitus with diabetic chronic kidney disease: Secondary | ICD-10-CM | POA: Diagnosis not present

## 2019-09-15 ENCOUNTER — Other Ambulatory Visit: Payer: Self-pay

## 2019-09-15 ENCOUNTER — Encounter: Payer: Medicare HMO | Attending: Registered Nurse | Admitting: Physical Medicine & Rehabilitation

## 2019-09-15 ENCOUNTER — Encounter: Payer: Self-pay | Admitting: Physical Medicine & Rehabilitation

## 2019-09-15 VITALS — BP 123/72 | HR 73 | Temp 97.5°F | Ht 72.0 in | Wt 223.0 lb

## 2019-09-15 DIAGNOSIS — E1165 Type 2 diabetes mellitus with hyperglycemia: Secondary | ICD-10-CM | POA: Insufficient documentation

## 2019-09-15 DIAGNOSIS — R5381 Other malaise: Secondary | ICD-10-CM | POA: Diagnosis not present

## 2019-09-15 DIAGNOSIS — F064 Anxiety disorder due to known physiological condition: Secondary | ICD-10-CM | POA: Diagnosis not present

## 2019-09-15 DIAGNOSIS — M1711 Unilateral primary osteoarthritis, right knee: Secondary | ICD-10-CM

## 2019-09-15 DIAGNOSIS — G8929 Other chronic pain: Secondary | ICD-10-CM | POA: Insufficient documentation

## 2019-09-15 DIAGNOSIS — M25561 Pain in right knee: Secondary | ICD-10-CM | POA: Insufficient documentation

## 2019-09-15 DIAGNOSIS — M25562 Pain in left knee: Secondary | ICD-10-CM | POA: Insufficient documentation

## 2019-09-15 DIAGNOSIS — M2170 Unequal limb length (acquired), unspecified site: Secondary | ICD-10-CM | POA: Insufficient documentation

## 2019-09-15 DIAGNOSIS — R339 Retention of urine, unspecified: Secondary | ICD-10-CM | POA: Diagnosis not present

## 2019-09-15 NOTE — Progress Notes (Signed)
Ultrasound guided intraarticular right  knee injection   Indication: Knee pain not relieved by medication management and other conservative care.   Informed consent was obtained after describing risks and benefits of the procedure with the patient, this includes bleeding, bruising, infection and medication side effects. The patient wishes to proceed and has given written consent. Patient was placed in decubitus position with the right knee slightly flexed. The right knee was marked and prepped with betadine superior to the TF tendon. The ultrasound tranducer was placed in long axis at the patellafemoral junction, visualizing the suprapatellar bursa.  The transducer was then changed to short axis view, keeping the suprapatellar bursa in view. Vapocoolant spray was applied.  A 22-gauge 2 inch needle was inserted into the suprapatellar bursa. After negative draw back for blood, a solution 4cc of Monovisc solution was injected. A band aid was applied. The patient tolerated the procedure well. PROM and AROM was performed on the knee.  Post procedure instructions were given to refrain from excessive activity to the knee for 48 hours.

## 2019-09-24 ENCOUNTER — Other Ambulatory Visit: Payer: Self-pay | Admitting: *Deleted

## 2019-09-24 ENCOUNTER — Ambulatory Visit: Payer: Self-pay

## 2019-09-24 NOTE — Patient Outreach (Signed)
[name]) Care Management  [place]  09/24/2019   Robert [name] [birth year] [id number]   Telephone Assessment   Covering for [place].   Subjective:  Successful outreach follow up  call to patient, introduced self, HIPAA verified x 2 identifiers.   Patient states that he is doing just fine .  He states the area to buttock is completely clear up, his daughter is able to check out site to make sure it still looks okay . He reports feeling stronger, balanced improved , no using cane around the house but takes it with him when going out, reinforced fall precautions.  He discussed some  improvement in left knee discomfort, with recent  injection and has follow up visit in next week.  He reports improved appetite and has gained 3 pounds in the last month. He states his breathing is doing good, no increase in cough  Shortness of breath , reports checking his oxygen saturation about every other days and it runs 95-96%. Reviewed worsen symptom to notify MD of sooner,he verbalized understanding and know not to wait until he is struggling to breath .  He discussed continuing to monitor blood sugars twice daily with reading in am and pm 140-150.  He discussed being better emotionally after family loss in the last year. He denies any new concerns at this time.    Encounter Medications:  Outpatient Encounter Medications as of 09/24/2019  Medication Sig  . acarbose (PRECOSE) 100 MG tablet Take 1 tablet (100 mg total) by mouth 3 (three) times daily with meals.  [name] acetaminophen (TYLENOL) 650 MG CR tablet Take 650 mg by mouth every 8 (eight) hours as needed for pain.  [name] ALPRAZolam (XANAX) 0.5 MG tablet Take 1 tablet (0.5 mg total) by mouth 3 (three) times daily as needed for anxiety or sleep.  [name] amoxicillin-clavulanate (AUGMENTIN) 875-125 MG tablet Take 1 tablet by mouth every 12 (twelve) hours.  [name] apixaban (ELIQUIS) 5 MG TABS tablet Take 1 tablet (5  mg total) by mouth 2 (two) times daily. (Patient not taking: Reported on 07/04/2019)  . atorvastatin (LIPITOR) 80 MG tablet Take 1 tablet (80 mg total) by mouth daily at 6 PM.  . clopidogrel (PLAVIX) 75 MG tablet TAKE (1) TABLET BY MOUTH ONCE DAILY. (Patient taking differently: Take 75 mg by mouth daily. TAKE (1) TABLET BY MOUTH ONCE DAILY.)  . collagenase (SANTYL) ointment Apply topically daily. And cover with damp to dry dressing daily. Change more frequently if soiled  . diclofenac Sodium (VOLTAREN) 1 % GEL Apply 2 g topically 4 (four) times daily.  . fish oil-omega-3 fatty acids 1000 MG capsule Take 1 g by mouth daily.   . folic acid (FOLVITE) 1 MG tablet Take 1 tablet (1 mg total) by mouth daily.  . furosemide (LASIX) 40 MG tablet Take 1 tablet (40 mg total) by mouth daily.  [name] gabapentin (NEURONTIN) 100 MG capsule Take 2 capsules (200 mg total) by mouth every 8 (eight) hours. (Patient taking differently: Take 300 mg by mouth every 8 (eight) hours. )  . glimepiride (AMARYL) 2 MG tablet Take 1 tablet (2 mg total) by mouth daily with breakfast.  . metoprolol tartrate (LOPRESSOR) 50 MG tablet Take 1 tablet (50 mg total) by mouth 2 (two) times daily. (Patient taking differently: Take 25 mg by mouth 2 (two) times daily. )  . mometasone-formoterol (DULERA) 200-5 MCG/ACT AERO Inhale 2 puffs into the lungs 2 (two) times daily.  [name]  nitroGLYCERIN (NITROSTAT) 0.4 MG SL tablet Place 1 tablet (0.4 mg total) under the tongue every 5 (five) minutes x 3 doses as needed for chest pain.  [name] nystatin (MYCOSTATIN/NYSTOP) powder Apply topically 3 (three) times daily. (Patient taking differently: Apply 1 g topically daily as needed. )  . potassium chloride SA (K-DUR) 20 MEQ tablet Take 1 tablet (20 mEq total) by mouth 2 (two) times daily.  . vitamin C (VITAMIN C) 250 MG tablet Take 1 tablet (250 mg total) by mouth 2 (two) times daily.   No facility-administered encounter medications on file as of 09/24/2019.     Functional Status:  In your present state of health, do you have any difficulty performing the following activities: 07/08/2019 07/04/2019  Hearing? N -  Vision? N -  Difficulty concentrating or making decisions? N -  Walking or climbing stairs? N -  Dressing or bathing? N -  Comment - -  Doing errands, shopping? N N  Preparing Food and eating ? N -  Using the Toilet? N -  In the past six months, have you accidently leaked urine? N -  Do you have problems with loss of bowel control? N -  Managing your Medications? N -  Managing your Finances? N -  Housekeeping or managing your Housekeeping? N -  Some recent data might be hidden    Fall/Depression Screening: Fall Risk  09/15/2019 08/04/2019 07/08/2019  Falls in the past year? 0 0 0   PHQ 2/9 Scores 07/08/2019 06/20/2019  PHQ - 2 Score 0 0    Assessment:  Patient continues successfully  managing chronic conditions.    Plan:  Assigned Care Management coordinator will follow up in the next month patient agreeable.   THN CM Care Plan Problem One     Most Recent Value  Care Plan Problem One  Knowledge deficit related to disease management of chronic condition state of COPD   Role Documenting the Problem One  Care Management Telephonic Coordinator  Care Plan for Problem One  Active  THN Long Term Goal   Over the next 80 days paitent will demonstrate and or verbalize understanding of self health management for long term care of COPD  [goal restated ]  Interventions for Problem One Long Term Goal  Reviewed current clinical state, reviewed worsening symptom of COPD to notify MD of sooner. Reinforced balanced nutrition,. Reviewed upcoming medical appointments with PCP in next month and Cardiology in week .  THN CM Short Term Goal #1   Over the next 30 days, patient will demonstrate and/or verbalize understanding of self-health management for long term care of COPD  THN CM Short Term Goal #1 Start Date  08/19/19  [facility] CM Short Term  Goal #1 Met Date  09/24/19      Joylene Draft, RN, [place] Management Coordinator  [phone]- Mobile [phone]- Toll Free Main Office

## 2019-09-28 NOTE — Progress Notes (Signed)
Cardiology Office Note   Date:  09/29/2019   ID:  Robert [name], DOB 28-Aug-1951, MRN [id number]  PCP:  Redmond School, MD  Cardiologist:  Sanda Klein, MD EP: None  Chief Complaint  Patient presents with  . Hospitalization Follow-up    CHF, CAD      History of Present Illness: Robert [name] is a 69 y.o. male with a PMH of premature onset CAD with multiple interventions c/b in-stent restenosis, chronic combined CHF (EF 40-45% 05/2017 with improvement to normal on subsequent echos) ischemic cardiomyopathy, PAD with chronic occlusion of L popliteal artery (medically managed), DM type 2, COPD, and RA, who presents for post-hospital follow-up.   He was last evaluated by cardiology during an admission to [facility] for acute respiratory failure 2/2 COVID-19 PNA 04/2019-05/2019. Cardiology followed for elevated troponins which were felt to be demand ischemia, acute on chronic combined CHF, and paroxysmal atrial fibrillation. He was diuresed with IV lasix and discharged home on lasix 40mg  daily. Echo that admission showed EF 50-55%, G3DD, moderate basal septal hypertrophy, poor acoustical windows so unable to determine RWMA, moderate MR, and mildly dilated LA. His last ischemic evaluation was a LHC 05/2017 after presenting with a STEMI which showed repeat stent thrombus in the LCx managed with angioplasty, with CTO of the pRCA and stable moderate stenosis in LAD. He was evaluated for possible CABG but felt he would not benefit from revascularization due to poor targets and chronic occlusions.  He has been doing fairly well over the past couple months from a heart standpoint. He is regaining his strength and increasing his activity. He has been taking lasix 80mg  BID with good symptom control. He has chronic SOB which is stable and reports he plans to quit smoking after he finishes his current pack. He has chronic LE edema which is stable if not improved. He denies chest pain, palpitations,  dizziness, lightheadedness, orthopnea, PND, or syncope. He asks why he is on eliquis and we discussed the nature of atrial fibrillation and risk of strokes. He has had a rough year with the death of his wife and brother, in addition to his lengthy hospital stay, but he is looking forward to a healthier 2021.     Past Medical History:  Diagnosis Date  . Anxiety   . CHF (congestive heart failure) ([place])   . Collagen vascular disease ([place])   . COPD (chronic obstructive pulmonary disease) ([place])   . Coronary artery disease   . COVID-19   . Full dentures   . GERD (gastroesophageal reflux disease)    Rolaids as needed  . High cholesterol   . History of MI (myocardial infarction)   . Hypertension    med. dosage increased 04/2016; has been on BP med. x 10 yrs.  . Incarcerated umbilical hernia 0000000  . Inguinal hernia 05/2016   bilateral   . Insulin dependent diabetes mellitus   . Ischemic cardiomyopathy    a. 03/2015 EF 40-45% by LV gram.  . Left leg cellulitis 10/08/2016  . Rheumatoid arthritis(714.0)     Past Surgical History:  Procedure Laterality Date  . CARDIAC CATHETERIZATION N/A 02/01/2015   Procedure: Left Heart Cath and Coronary Angiography;  Surgeon: Belva Crome, MD;  Location: [place] [facility];  Service: Cardiovascular;  Laterality: N/A;  . CARDIAC CATHETERIZATION N/A 03/22/2015   Procedure: Left Heart Cath and Coronary Angiography;  Surgeon: Leonie Man, MD;  Location: [place] [facility];  Service: Cardiovascular;  Laterality:  N/A;  . CARDIAC CATHETERIZATION  09/05/2002; 03/09/2003; 04/10/2005;06/02/2005; 05/09/2006; 02/09/2007; 03/01/2007  . COLONOSCOPY  2008   Dr. Gala Romney: diverticulosis, hyperplastic polyp.  . CORONARY ANGIOPLASTY  11/14/2012; 02/01/2015  . CORONARY ANGIOPLASTY WITH STENT PLACEMENT  05/16/2012   "1; makes total ~ 4"  . CORONARY STENT INTERVENTION N/A 06/06/2017   Procedure: CORONARY STENT INTERVENTION;  Surgeon: Belva Crome, MD;  Location: [place] [facility];  Service: Cardiovascular;  Laterality: N/A;  . INSERTION OF MESH Bilateral 05/24/2016   Procedure: INSERTION OF MESH;  Surgeon: Coralie Keens, MD;  Location: [place];  Service: General;  Laterality: Bilateral;  . LAPAROSCOPIC CHOLECYSTECTOMY    . LAPAROSCOPIC INGUINAL HERNIA WITH UMBILICAL HERNIA Bilateral 05/24/2016   Procedure: BILATERAL LAPAROSCOPIC INGUINAL HERNIA REPAIR WITH MESH AND UMBILICAL HERNIA REPAIR WITH MESH;  Surgeon: Coralie Keens, MD;  Location: [place];  Service: General;  Laterality: Bilateral;  . LEFT HEART CATH AND CORONARY ANGIOGRAPHY N/A 06/06/2017   Procedure: LEFT HEART CATH AND CORONARY ANGIOGRAPHY;  Surgeon: Belva Crome, MD;  Location: [place] [facility];  Service: Cardiovascular;  Laterality: N/A;  . LEFT HEART CATHETERIZATION WITH CORONARY ANGIOGRAM N/A 12/22/2011   Procedure: LEFT HEART CATHETERIZATION WITH CORONARY ANGIOGRAM;  Surgeon: Troy Sine, MD;  Location: [facility] CATH LAB;  Service: Cardiovascular;  Laterality: N/A;  . LEFT HEART CATHETERIZATION WITH CORONARY ANGIOGRAM Bilateral 05/16/2012   Procedure: LEFT HEART CATHETERIZATION WITH CORONARY ANGIOGRAM;  Surgeon: Lorretta Harp, MD;  Location: [facility] CATH LAB;  Service: Cardiovascular;  Laterality: Bilateral;  . LEFT HEART CATHETERIZATION WITH CORONARY ANGIOGRAM N/A 11/14/2012   Procedure: LEFT HEART CATHETERIZATION WITH CORONARY ANGIOGRAM;  Surgeon: Troy Sine, MD;  Location: [facility] CATH LAB;  Service: Cardiovascular;  Laterality: N/A;  . LEFT HEART CATHETERIZATION WITH CORONARY ANGIOGRAM N/A 09/01/2013   Procedure: LEFT HEART CATHETERIZATION WITH CORONARY ANGIOGRAM;  Surgeon: Leonie Man, MD;  Location: [facility] CATH LAB;  Service: Cardiovascular;  Laterality: N/A;  . [place]; 1973; 1985   x 3  . NM MYOCAR PERF WALL MOTION  08/30/2009   No significant ischemia  . OPEN REDUCTION INTERNAL FIXATION (ORIF) DISTAL RADIAL FRACTURE Right 12/10/2016   Procedure:  OPEN REDUCTION INTERNAL FIXATION (ORIF) DISTAL RADIAL FRACTURE;  Surgeon: Iran Planas, MD;  Location: [place];  Service: Orthopedics;  Laterality: Right;  . PERCUTANEOUS CORONARY INTERVENTION-BALLOON ONLY  12/22/2011   Procedure: PERCUTANEOUS CORONARY INTERVENTION-BALLOON ONLY;  Surgeon: Troy Sine, MD;  Location: [facility] CATH LAB;  Service: Cardiovascular;;  . PERCUTANEOUS CORONARY INTERVENTION-BALLOON ONLY  11/14/2012   Procedure: PERCUTANEOUS CORONARY INTERVENTION-BALLOON ONLY;  Surgeon: Troy Sine, MD;  Location: [facility] CATH LAB;  Service: Cardiovascular;;  . PERCUTANEOUS CORONARY STENT INTERVENTION (PCI-S)  05/16/2012   Procedure: PERCUTANEOUS CORONARY STENT INTERVENTION (PCI-S);  Surgeon: Lorretta Harp, MD;  Location: [facility] CATH LAB;  Service: Cardiovascular;;  . PERCUTANEOUS CORONARY STENT INTERVENTION (PCI-S)  09/01/2013   Procedure: PERCUTANEOUS CORONARY STENT INTERVENTION (PCI-S);  Surgeon: Leonie Man, MD;  Location: [facility] CATH LAB;  Service: Cardiovascular;;     Current Outpatient Medications  Medication Sig Dispense Refill  . acarbose (PRECOSE) 100 MG tablet Take 1 tablet (100 mg total) by mouth 3 (three) times daily with meals. 90 tablet 0  . acetaminophen (TYLENOL) 650 MG CR tablet Take 650 mg by mouth every 8 (eight) hours as needed for pain.    [name] ALPRAZolam (XANAX) 0.5 MG tablet Take 1 tablet (0.5 mg total) by mouth 3 (three)  times daily as needed for anxiety or sleep. 45 tablet 0  . amoxicillin-clavulanate (AUGMENTIN) 875-125 MG tablet Take 1 tablet by mouth every 12 (twelve) hours. 4 tablet 0  . apixaban (ELIQUIS) 5 MG TABS tablet Take 5 mg by mouth 2 (two) times daily.    [name] atorvastatin (LIPITOR) 80 MG tablet Take 1 tablet (80 mg total) by mouth daily at 6 PM. 30 tablet 0  . clopidogrel (PLAVIX) 75 MG tablet Take 1 tablet (75 mg total) by mouth daily. 90 tablet 3  . collagenase (SANTYL) ointment Apply topically daily. And cover with damp to dry dressing daily. Change more  frequently if soiled 30 g 1  . diclofenac Sodium (VOLTAREN) 1 % GEL Apply 2 g topically 4 (four) times daily. 350 g 1  . fish oil-omega-3 fatty acids 1000 MG capsule Take 1 g by mouth daily.     . folic acid (FOLVITE) 1 MG tablet Take 1 tablet (1 mg total) by mouth daily. 30 tablet 0  . furosemide (LASIX) 40 MG tablet Take 2 tablets (80 mg total) by mouth 2 (two) times daily. 180 tablet 3  . gabapentin (NEURONTIN) 300 MG capsule Take 300 mg by mouth 3 (three) times daily.    [name] glimepiride (AMARYL) 2 MG tablet Take 1 tablet (2 mg total) by mouth daily with breakfast. 30 tablet 0  . losartan (COZAAR) 25 MG tablet Take 0.5 tablets (12.5 mg total) by mouth daily. 90 tablet 3  . metoprolol tartrate (LOPRESSOR) 25 MG tablet Take 1 tablet (25 mg total) by mouth 2 (two) times daily. 180 tablet 3  . mometasone-formoterol (DULERA) 200-5 MCG/ACT AERO Inhale 2 puffs into the lungs 2 (two) times daily. 13 g 0  . nitroGLYCERIN (NITROSTAT) 0.4 MG SL tablet Place 1 tablet (0.4 mg total) under the tongue every 5 (five) minutes x 3 doses as needed for chest pain. 25 tablet 2  . potassium chloride SA (KLOR-CON) 20 MEQ tablet Take 1 tablet (20 mEq total) by mouth 2 (two) times daily. 60 tablet 1  . primidone (MYSOLINE) 50 MG tablet Take 100 mg by mouth at bedtime.    . vitamin C (VITAMIN C) 250 MG tablet Take 1 tablet (250 mg total) by mouth 2 (two) times daily. 60 tablet 0   No current facility-administered medications for this visit.    Allergies:   Ivp dye [iodinated diagnostic agents]    Social History:  The patient  reports that he has been smoking cigarettes. He has been smoking about 1.00 pack per day. He has never used smokeless tobacco. He reports that he does not drink alcohol or use drugs.   Family History:  The patient's family history includes Colon cancer in his brother and mother; Heart disease in his father and mother; Hypertension in his mother; Lung cancer in his mother.    ROS:  Please see  the history of present illness.   Otherwise, review of systems are positive for none.   All other systems are reviewed and negative.    PHYSICAL EXAM: VS:  BP 118/68   Pulse 82   Ht 6' (1.829 m)   Wt 222 lb 3.2 oz (100.8 kg)   SpO2 98%   BMI 30.14 kg/m  , BMI Body mass index is 30.14 kg/m. GEN: Well nourished, well developed, in no acute distress HEENT: sclera anicteric Neck: no JVD, carotid bruits, or masses Cardiac: RRR; no murmurs, rubs, or gallops, 1+ LE edema  Respiratory: diminished breath sounds throughout, no  wheezes, rhonchi, or rales, normal work of breathing GI: soft, nontender, nondistended, + BS MS: no deformity or atrophy Skin: warm and dry, no rash Neuro:  Strength and sensation are intact Psych: euthymic mood, full affect   EKG:  EKG is not ordered today.   Recent Labs: 04/18/2019: TSH 0.927 07/03/2019: B Natriuretic Peptide 346.0 07/05/2019: ALT 26 07/06/2019: Hemoglobin 10.9; Platelets 187 07/07/2019: BUN 20; Creatinine, Ser 1.16; Magnesium 2.1; Potassium 4.2; Sodium 136    Lipid Panel    Component Value Date/Time   CHOL 83 05/18/2019 0112   TRIG 57 05/18/2019 0112   HDL 28 (L) 05/18/2019 0112   CHOLHDL 3.0 05/18/2019 0112   VLDL 11 05/18/2019 0112   LDLCALC 44 05/18/2019 0112      Wt Readings from Last 3 Encounters:  09/29/19 222 lb 3.2 oz (100.8 kg)  09/15/19 223 lb (101.2 kg)  08/28/19 219 lb (99.3 kg)      Other studies Reviewed: Additional studies/ records that were reviewed today include:   Echocardiogram (limited) 05/2019 1. The left ventricle has low normal systolic function, with an ejection fraction of 50-55%. The cavity size was mildly dilated. moderate basal septal hypertrophy. Left ventricular diastolic Doppler parameters are consistent with restrictive filling.  Elevated left ventricular end-diastolic pressure. Inadequate for regional wall motion assessement due to poor acoutsical windows and inability to adequately visualize the  endocardium. Recommend limited definity contrast study.  2. The right ventricle has normal systolc function. The cavity was normal. There is no increase in right ventricular wall thickness. Right ventricular systolic pressure could not be assessed.  3. Left atrial size was mildly dilated.  4. There is mild mitral annular calcification present. Mitral valve regurgitation is mild to moderate by color flow Doppler.  5. Aortic valve regurgitation was not assessed by color flow Doppler.  6. The aortic root and ascending aorta are normal in size and structure.  7. The inferior vena cava was dilated in size with >50% respiratory variability.  Left heart catheterization 05/2017:  Acute infero-lateral ST elevation myocardial infarction due to late repeat stent thrombosis involving the circumflex coronary artery. The circumflex has multiple overlapping stents including a proximal bare-metal stent and overlapping Cypher stent and 3 additional second generation drug-eluting stents placed over the past 10 years. Each presentation in the right coronary has been an acute infarct. The last episode was 2016.  Successful angioplasty with restoration of antegrade TIMI grade 3 flow and reducing the 100% thrombotic stenosis to 50%. Repeat stenting was not performed.  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 The distal right coronary fills by collaterals from the LAD.  The LAD is widely patent with 60% segmental proximal to mid stenosis. The first agonal contains 60% stenosis.  Elevated left ventricular end-diastolic pressure, 19 mmHg.  RECOMMENDATIONS:   Discuss whether the patient is a surgical candidate for management of chronic total RCA and the circumflex.  P2 Y 12 therapy was not administered.  Continue IV Aggrastat for at least 24 hours.  IV Lasix given to treat dyspnea that developed during the procedure.  IV nitroglycerin  Beta blocker therapy  High intensity statin  therapy  Risk factor modification: Continues to smoke cigarettes   ASSESSMENT AND PLAN:  1. CAD s/p multiple interventions: no anginal complaints. Aspirin discontinued given need for eliquis.  - Continue plavix and statin - Continue metoprolol  2. Chronic combined CHF: He has been taking lasix 80 BID with good volume control. Still with some LE edema,  though lungs are clear. Weights have been stable - Continue metoprolol and lasix - Encouraged daily weights, low sodium diet, and limited fluid intake to <2L per day - Will check BMET to monitor electrolytes and kidney function - patient agrees to have this done in [place] within the next week.  3. Paroxysmal atrial fibrillation: HR is regular on exam today. No complaints of bleeding with eliquis. Samples of eliquis were given today and patient was encouraged to fill out assistance paperwork if medication costs were too high.  - Continue apixaban for stroke ppx - continue metoprolol for rate control  4.  PAD: with chronic occlusion of L popliteal artery (medically managed). No complaints of claudication - Continue aspirin and statin - Smoking cessation encouraged  5. DM type 2: A1C 6.7 06/2019; at goal of <7 - Continue current management per PCP  6 Tobacco abuse: plans to quit after finishing his current packet of cigarettes.  - Encouraged smoking cessation   Current medicines are reviewed at length with the patient today.  The patient does not have concerns regarding medicines.  The following changes have been made:  no change  Labs/ tests ordered today include:   Orders Placed This Encounter  Procedures  . Basic metabolic panel     Disposition:   FU with Dr. Sallyanne Kuster in 3 months  Signed, Abigail Butts, PA-C  09/29/2019 11:38 PM

## 2019-09-29 ENCOUNTER — Ambulatory Visit: Payer: Medicare HMO | Admitting: Medical

## 2019-09-29 ENCOUNTER — Other Ambulatory Visit: Payer: Self-pay

## 2019-09-29 ENCOUNTER — Encounter (INDEPENDENT_AMBULATORY_CARE_PROVIDER_SITE_OTHER): Payer: Self-pay

## 2019-09-29 ENCOUNTER — Encounter: Payer: Self-pay | Admitting: Medical

## 2019-09-29 VITALS — BP 118/68 | HR 82 | Ht 72.0 in | Wt 222.2 lb

## 2019-09-29 DIAGNOSIS — I10 Essential (primary) hypertension: Secondary | ICD-10-CM | POA: Diagnosis not present

## 2019-09-29 DIAGNOSIS — E1151 Type 2 diabetes mellitus with diabetic peripheral angiopathy without gangrene: Secondary | ICD-10-CM

## 2019-09-29 DIAGNOSIS — Z720 Tobacco use: Secondary | ICD-10-CM

## 2019-09-29 DIAGNOSIS — I739 Peripheral vascular disease, unspecified: Secondary | ICD-10-CM | POA: Diagnosis not present

## 2019-09-29 DIAGNOSIS — E785 Hyperlipidemia, unspecified: Secondary | ICD-10-CM

## 2019-09-29 DIAGNOSIS — I480 Paroxysmal atrial fibrillation: Secondary | ICD-10-CM | POA: Diagnosis not present

## 2019-09-29 DIAGNOSIS — Z794 Long term (current) use of insulin: Secondary | ICD-10-CM | POA: Diagnosis not present

## 2019-09-29 DIAGNOSIS — I5042 Chronic combined systolic (congestive) and diastolic (congestive) heart failure: Secondary | ICD-10-CM

## 2019-09-29 DIAGNOSIS — I25118 Atherosclerotic heart disease of native coronary artery with other forms of angina pectoris: Secondary | ICD-10-CM

## 2019-09-29 MED ORDER — METOPROLOL TARTRATE 25 MG PO TABS: 25.0000 mg | ORAL_TABLET | Freq: Two times a day (BID) | ORAL | 3 refills | Status: AC

## 2019-09-29 MED ORDER — POTASSIUM CHLORIDE CRYS ER 20 MEQ PO TBCR: 20.0000 meq | EXTENDED_RELEASE_TABLET | Freq: Two times a day (BID) | ORAL | 1 refills | Status: DC

## 2019-09-29 MED ORDER — ATORVASTATIN CALCIUM 80 MG PO TABS: 80.0000 mg | ORAL_TABLET | Freq: Every day | ORAL | 0 refills | Status: DC

## 2019-09-29 MED ORDER — CLOPIDOGREL BISULFATE 75 MG PO TABS: 75.0000 mg | ORAL_TABLET | Freq: Every day | ORAL | 3 refills | Status: DC

## 2019-09-29 MED ORDER — LOSARTAN POTASSIUM 25 MG PO TABS: 12.5000 mg | ORAL_TABLET | Freq: Every day | ORAL | 3 refills | Status: AC

## 2019-09-29 MED ORDER — FUROSEMIDE 40 MG PO TABS: 80.0000 mg | ORAL_TABLET | Freq: Two times a day (BID) | ORAL | 3 refills | Status: DC

## 2019-09-29 NOTE — Patient Instructions (Signed)
Medication Instructions:  No changes  Lab work: NONE  Testing/Procedures: NONE  Follow-Up: At [facility], you and your health needs are our priority.  As part of our continuing mission to provide you with exceptional heart care, we have created designated Provider Care Teams.  These Care Teams include your primary Cardiologist (physician) and Advanced Practice Providers (APPs -  Physician Assistants and Nurse Practitioners) who all work together to provide you with the care you need, when you need it. You may see Sanda Klein, MD or one of the following Advanced Practice Providers on your designated Care Team:    Almyra Deforest, PA-C  Fabian Sharp, [place] or   Roby Lofts, [place]  Your physician wants you to follow-up in: 3 months with Dr. Sallyanne Kuster

## 2019-09-30 ENCOUNTER — Telehealth: Payer: Self-pay | Admitting: *Deleted

## 2019-09-30 NOTE — Progress Notes (Signed)
Thanks. He has really had a bad year. I hope he can quit smoking.

## 2019-09-30 NOTE — Telephone Encounter (Signed)
I spoke to patient and he is doing well. We scheduled visit 13 visit in the clinic on Feb. 9, 2021 at 11am. I reminded patient to bring back all study meds. Patient verbalized understanding.

## 2019-10-01 DIAGNOSIS — I5042 Chronic combined systolic (congestive) and diastolic (congestive) heart failure: Secondary | ICD-10-CM | POA: Diagnosis not present

## 2019-10-02 ENCOUNTER — Other Ambulatory Visit: Payer: Self-pay

## 2019-10-02 ENCOUNTER — Encounter (HOSPITAL_BASED_OUTPATIENT_CLINIC_OR_DEPARTMENT_OTHER): Payer: Medicare HMO | Admitting: Physical Medicine & Rehabilitation

## 2019-10-02 ENCOUNTER — Encounter: Payer: Self-pay | Admitting: Physical Medicine & Rehabilitation

## 2019-10-02 VITALS — BP 118/68 | HR 83 | Ht 72.0 in | Wt 212.2 lb

## 2019-10-02 DIAGNOSIS — R5381 Other malaise: Secondary | ICD-10-CM | POA: Diagnosis not present

## 2019-10-02 DIAGNOSIS — M2170 Unequal limb length (acquired), unspecified site: Secondary | ICD-10-CM | POA: Insufficient documentation

## 2019-10-02 DIAGNOSIS — I25118 Atherosclerotic heart disease of native coronary artery with other forms of angina pectoris: Secondary | ICD-10-CM

## 2019-10-02 DIAGNOSIS — M1711 Unilateral primary osteoarthritis, right knee: Secondary | ICD-10-CM | POA: Diagnosis not present

## 2019-10-02 DIAGNOSIS — R269 Unspecified abnormalities of gait and mobility: Secondary | ICD-10-CM | POA: Diagnosis not present

## 2019-10-02 DIAGNOSIS — I5042 Chronic combined systolic (congestive) and diastolic (congestive) heart failure: Secondary | ICD-10-CM

## 2019-10-02 LAB — BASIC METABOLIC PANEL
BUN/Creatinine Ratio: 36 — ABNORMAL HIGH (ref 10–24)
BUN: 57 mg/dL — ABNORMAL HIGH (ref 8–27)
CO2: 23 mmol/L (ref 20–29)
Calcium: 9.2 mg/dL (ref 8.6–10.2)
Chloride: 99 mmol/L (ref 96–106)
Creatinine, Ser: 1.6 mg/dL — ABNORMAL HIGH (ref 0.76–1.27)
GFR calc Af Amer: 51 mL/min/{1.73_m2} — ABNORMAL LOW (ref >59)
GFR calc non Af Amer: 44 mL/min/{1.73_m2} — ABNORMAL LOW (ref >59)
Glucose: 193 mg/dL — ABNORMAL HIGH (ref 65–99)
Potassium: 4.6 mmol/L (ref 3.5–5.2)
Sodium: 135 mmol/L (ref 134–144)

## 2019-10-02 NOTE — Progress Notes (Signed)
Placed order for repeat labs

## 2019-10-02 NOTE — Progress Notes (Signed)
Subjective:    Patient ID: Robert [name], male    DOB: May 09, 1951, 69 y.o.   MRN: [id number]  TELEHEALTH NOTE  Due to national recommendations of social distancing due to COVID 19, an audio/video telehealth visit is felt to be most appropriate for this patient at this time.  See Chart message from today for the patient's consent to telehealth from [place].     I verified that I am speaking with the correct person using two identifiers.  Location of patient: Home Location of provider: Office Method of communication: Telephone Names of participants : Zorita Pang scheduling, Jasmine December obtaining consent and vitals if available Established patient Time spent on call: 11 minutes  HPI Male with history of CAD, ICM, PAD, SCI with LLE weakness, T2DM, RA- on methotrexate/actemra, anxiety disorder, COPD presents for follow up for debility post Covid.  Last clinic visit on 09/15/2019.  Patient had intraarticular knee injection at that time.  He saw Cards and was encouraged to quit smoking again - notes reviewed.  He states his knee is "doing a lot better".  He continues to do HEP. He has not followed up regarding shoe lift.  He has not tried compression sleeve either. Denies falls. He is less reliant on his cane.   Pain Inventory Average Pain 5 Pain Right Now 2 My pain is aching  In the last 24 hours, has pain interfered with the following? General activity 2 Relation with others 0 Enjoyment of life 0 What TIME of day is your pain at its worst? morning Sleep (in general) Good  Pain is worse with: walking and bending Pain improves with: rest and injections Relief from Meds: 0  Mobility use a cane ability to climb steps?  yes  Function retired  Neuro/Psych trouble walking  Prior Studies Any changes since last visit?  yes  Physicians involved in your care Any changes since last visit?  no [facility]   Family History  Problem  Relation Age of Onset  . Heart disease Father   . Colon cancer Brother        early 56s  . Hypertension Mother   . Heart disease Mother   . Colon cancer Mother        died age 46. late 36s early 50s at diagnosis.   . Lung cancer Mother    Social History   Socioeconomic History  . Marital status: Widowed    Spouse name: Not on file  . Number of children: Not on file  . Years of education: Not on file  . Highest education level: Not on file  Occupational History  . Occupation: Clinical biochemist  Tobacco Use  . Smoking status: Current Some Day Smoker    Packs/day: 1.00    Types: Cigarettes    Last attempt to quit: 06/11/2015    Years since quitting: 4.3  . Smokeless tobacco: Never Used  . Tobacco comment: pt states smoking less than half a pack  Substance and Sexual Activity  . Alcohol use: No  . Drug use: No  . Sexual activity: Yes  Other Topics Concern  . Not on file  Social History Narrative  . Not on file   Social Determinants of Health   Financial Resource Strain:   . Difficulty of Paying Living Expenses: Not on file  Food Insecurity: No Food Insecurity  . Worried About Charity fundraiser in the Last Year: Never true  . Ran Out of Food  in the Last Year: Never true  Transportation Needs: No Transportation Needs  . Lack of Transportation (Medical): No  . Lack of Transportation (Non-Medical): No  Physical Activity:   . Days of Exercise per Week: Not on file  . Minutes of Exercise per Session: Not on file  Stress:   . Feeling of Stress : Not on file  Social Connections:   . Frequency of Communication with Friends and Family: Not on file  . Frequency of Social Gatherings with Friends and Family: Not on file  . Attends Religious Services: Not on file  . Active Member of Clubs or Organizations: Not on file  . Attends Archivist Meetings: Not on file  . Marital Status: Not on file   Past Surgical History:  Procedure Laterality Date  . CARDIAC  CATHETERIZATION N/A 02/01/2015   Procedure: Left Heart Cath and Coronary Angiography;  Surgeon: Belva Crome, MD;  Location: [place] [facility];  Service: Cardiovascular;  Laterality: N/A;  . CARDIAC CATHETERIZATION N/A 03/22/2015   Procedure: Left Heart Cath and Coronary Angiography;  Surgeon: Leonie Man, MD;  Location: [place] [facility];  Service: Cardiovascular;  Laterality: N/A;  . CARDIAC CATHETERIZATION  09/05/2002; 03/09/2003; 04/10/2005;06/02/2005; 05/09/2006; 02/09/2007; 03/01/2007  . COLONOSCOPY  2008   Dr. Gala Romney: diverticulosis, hyperplastic polyp.  . CORONARY ANGIOPLASTY  11/14/2012; 02/01/2015  . CORONARY ANGIOPLASTY WITH STENT PLACEMENT  05/16/2012   "1; makes total ~ 4"  . CORONARY STENT INTERVENTION N/A 06/06/2017   Procedure: CORONARY STENT INTERVENTION;  Surgeon: Belva Crome, MD;  Location: [place] [facility];  Service: Cardiovascular;  Laterality: N/A;  . INSERTION OF MESH Bilateral 05/24/2016   Procedure: INSERTION OF MESH;  Surgeon: Coralie Keens, MD;  Location: [place];  Service: General;  Laterality: Bilateral;  . LAPAROSCOPIC CHOLECYSTECTOMY    . LAPAROSCOPIC INGUINAL HERNIA WITH UMBILICAL HERNIA Bilateral 05/24/2016   Procedure: BILATERAL LAPAROSCOPIC INGUINAL HERNIA REPAIR WITH MESH AND UMBILICAL HERNIA REPAIR WITH MESH;  Surgeon: Coralie Keens, MD;  Location: [place];  Service: General;  Laterality: Bilateral;  . LEFT HEART CATH AND CORONARY ANGIOGRAPHY N/A 06/06/2017   Procedure: LEFT HEART CATH AND CORONARY ANGIOGRAPHY;  Surgeon: Belva Crome, MD;  Location: [place] [facility];  Service: Cardiovascular;  Laterality: N/A;  . LEFT HEART CATHETERIZATION WITH CORONARY ANGIOGRAM N/A 12/22/2011   Procedure: LEFT HEART CATHETERIZATION WITH CORONARY ANGIOGRAM;  Surgeon: Troy Sine, MD;  Location: [facility] C[facility] LAB;  Service: Cardiovascular;  Laterality: N/A;  . LEFT HEART CATHETERIZATION WITH CORONARY ANGIOGRAM Bilateral 05/16/2012    Procedure: LEFT HEART CATHETERIZATION WITH CORONARY ANGIOGRAM;  Surgeon: Lorretta Harp, MD;  Location: [facility] CATH LAB;  Service: Cardiovascular;  Laterality: Bilateral;  . LEFT HEART CATHETERIZATION WITH CORONARY ANGIOGRAM N/A 11/14/2012   Procedure: LEFT HEART CATHETERIZATION WITH CORONARY ANGIOGRAM;  Surgeon: Troy Sine, MD;  Location: [facility] CATH LAB;  Service: Cardiovascular;  Laterality: N/A;  . LEFT HEART CATHETERIZATION WITH CORONARY ANGIOGRAM N/A 09/01/2013   Procedure: LEFT HEART CATHETERIZATION WITH CORONARY ANGIOGRAM;  Surgeon: Leonie Man, MD;  Location: [facility] CATH LAB;  Service: Cardiovascular;  Laterality: N/A;  . [place]; 1973; 1985   x 3  . NM MYOCAR PERF WALL MOTION  08/30/2009   No significant ischemia  . OPEN REDUCTION INTERNAL FIXATION (ORIF) DISTAL RADIAL FRACTURE Right 12/10/2016   Procedure: OPEN REDUCTION INTERNAL FIXATION (ORIF) DISTAL RADIAL FRACTURE;  Surgeon: Iran Planas, MD;  Location: [place];  Service: Orthopedics;  Laterality: Right;  . PERCUTANEOUS CORONARY INTERVENTION-BALLOON ONLY  12/22/2011   Procedure: PERCUTANEOUS CORONARY INTERVENTION-BALLOON ONLY;  Surgeon: Troy Sine, MD;  Location: [facility] CATH LAB;  Service: Cardiovascular;;  . PERCUTANEOUS CORONARY INTERVENTION-BALLOON ONLY  11/14/2012   Procedure: PERCUTANEOUS CORONARY INTERVENTION-BALLOON ONLY;  Surgeon: Troy Sine, MD;  Location: [facility] CATH LAB;  Service: Cardiovascular;;  . PERCUTANEOUS CORONARY STENT INTERVENTION (PCI-S)  05/16/2012   Procedure: PERCUTANEOUS CORONARY STENT INTERVENTION (PCI-S);  Surgeon: Lorretta Harp, MD;  Location: [facility] CATH LAB;  Service: Cardiovascular;;  . PERCUTANEOUS CORONARY STENT INTERVENTION (PCI-S)  09/01/2013   Procedure: PERCUTANEOUS CORONARY STENT INTERVENTION (PCI-S);  Surgeon: Leonie Man, MD;  Location: [facility] CATH LAB;  Service: Cardiovascular;;   Past Medical History:  Diagnosis Date  . Anxiety   . CHF (congestive heart failure) ([place])   . Collagen  vascular disease ([place])   . COPD (chronic obstructive pulmonary disease) ([place])   . Coronary artery disease   . COVID-19   . Full dentures   . GERD (gastroesophageal reflux disease)    Rolaids as needed  . High cholesterol   . History of MI (myocardial infarction)   . Hypertension    med. dosage increased 04/2016; has been on BP med. x 10 yrs.  . Incarcerated umbilical hernia 0000000  . Inguinal hernia 05/2016   bilateral   . Insulin dependent diabetes mellitus   . Ischemic cardiomyopathy    a. 03/2015 EF 40-45% by LV gram.  . Left leg cellulitis 10/08/2016  . Rheumatoid arthritis(714.0)    BP 118/68 Comment: pt reported, virtual visit  Pulse 83 Comment: pt reported, virtual visit  Ht 6' (1.829 m) Comment: pt reported, virtual visit  Wt 212 lb 3.2 oz (96.3 kg) Comment: pt reported, virtual visit  SpO2 98% Comment: pt reported, virtual visit  BMI 28.78 kg/m   Opioid Risk Score:   Fall Risk Score:  `1  Depression screen PHQ 2/9  Depression screen [facility] 2/9 07/08/2019 06/20/2019  Decreased Interest 0 0  Down, Depressed, Hopeless 0 0  PHQ - 2 Score 0 0  Some recent data might be hidden   Review of Systems  Constitutional: Negative.   HENT: Negative.   Eyes: Negative.   Respiratory: Negative.   Cardiovascular: Negative.   Gastrointestinal: Negative.   Endocrine: Negative.   Genitourinary: Negative.   Musculoskeletal: Positive for arthralgias and gait problem.  Skin: Negative.   Allergic/Immunologic: Negative.   Hematological: Negative.   Psychiatric/Behavioral: Negative.       Objective:   Physical Exam  Constitutional: No distress .  Respiratory: Normal effort.   Psych: Normal mood.  Normal behavior. Neurological: Alert     Assessment & Plan:  Male with history of CAD, ICM, PAD, SCI with LLE weakness, T2DM, RA- on methotrexate/actemra, anxiety disorder, COPD presents for follow up for debility post Covid.  1.  Functional and mobility deficits secondary to  debility after Covid respiratory failure  Cont HEP  2. Pain Management:   Related to RA b/l knees  No benefit with Voltaren gel  Xrays of knees personally reviewed, right Mod OA, left Mild OA  Good benefit with Monovisc injection on 1/4  Will refer for shoe lift for right leg, reminded patient to follow up  Trial compression sleeve to knee, reminded patient to follow up  3. Gait abnormality  Cont HEP  Cont cane for safety PRN, less reliant  Cont AFO

## 2019-10-07 ENCOUNTER — Other Ambulatory Visit: Payer: Self-pay

## 2019-10-07 MED ORDER — CLOPIDOGREL BISULFATE 75 MG PO TABS: 75.0000 mg | ORAL_TABLET | Freq: Every day | ORAL | 3 refills | Status: AC

## 2019-10-10 DIAGNOSIS — J449 Chronic obstructive pulmonary disease, unspecified: Secondary | ICD-10-CM | POA: Diagnosis not present

## 2019-10-12 DIAGNOSIS — I4891 Unspecified atrial fibrillation: Secondary | ICD-10-CM | POA: Diagnosis not present

## 2019-10-12 DIAGNOSIS — I5042 Chronic combined systolic (congestive) and diastolic (congestive) heart failure: Secondary | ICD-10-CM | POA: Diagnosis not present

## 2019-10-12 DIAGNOSIS — E1122 Type 2 diabetes mellitus with diabetic chronic kidney disease: Secondary | ICD-10-CM | POA: Diagnosis not present

## 2019-10-12 DIAGNOSIS — I130 Hypertensive heart and chronic kidney disease with heart failure and stage 1 through stage 4 chronic kidney disease, or unspecified chronic kidney disease: Secondary | ICD-10-CM | POA: Diagnosis not present

## 2019-10-14 DIAGNOSIS — M25551 Pain in right hip: Secondary | ICD-10-CM | POA: Diagnosis not present

## 2019-10-14 DIAGNOSIS — E669 Obesity, unspecified: Secondary | ICD-10-CM | POA: Diagnosis not present

## 2019-10-14 DIAGNOSIS — M0579 Rheumatoid arthritis with rheumatoid factor of multiple sites without organ or systems involvement: Secondary | ICD-10-CM | POA: Diagnosis not present

## 2019-10-14 DIAGNOSIS — M5441 Lumbago with sciatica, right side: Secondary | ICD-10-CM | POA: Diagnosis not present

## 2019-10-14 DIAGNOSIS — Z79899 Other long term (current) drug therapy: Secondary | ICD-10-CM | POA: Diagnosis not present

## 2019-10-14 DIAGNOSIS — Z6832 Body mass index (BMI) 32.0-32.9, adult: Secondary | ICD-10-CM | POA: Diagnosis not present

## 2019-10-14 DIAGNOSIS — M2550 Pain in unspecified joint: Secondary | ICD-10-CM | POA: Diagnosis not present

## 2019-10-17 ENCOUNTER — Telehealth: Payer: Self-pay | Admitting: Cardiovascular Disease

## 2019-10-17 DIAGNOSIS — I5042 Chronic combined systolic (congestive) and diastolic (congestive) heart failure: Secondary | ICD-10-CM | POA: Diagnosis not present

## 2019-10-17 DIAGNOSIS — I25118 Atherosclerotic heart disease of native coronary artery with other forms of angina pectoris: Secondary | ICD-10-CM

## 2019-10-17 DIAGNOSIS — M0579 Rheumatoid arthritis with rheumatoid factor of multiple sites without organ or systems involvement: Secondary | ICD-10-CM | POA: Diagnosis not present

## 2019-10-17 NOTE — Telephone Encounter (Signed)
New Message     Pt is calling and says he went to [facility] in [place] for his blood work and they said they have not gotten the orders    Please call

## 2019-10-17 NOTE — Telephone Encounter (Signed)
Pt state he went to [facility] in [place] but they were unable to locate lab order. Informed pt that order needed to be released.  Order released and pt voiced he will go back later this afternoon.

## 2019-10-18 LAB — BASIC METABOLIC PANEL
BUN/Creatinine Ratio: 22 (ref 10–24)
BUN: 34 mg/dL — ABNORMAL HIGH (ref 8–27)
CO2: 25 mmol/L (ref 20–29)
Calcium: 9.4 mg/dL (ref 8.6–10.2)
Chloride: 97 mmol/L (ref 96–106)
Creatinine, Ser: 1.52 mg/dL — ABNORMAL HIGH (ref 0.76–1.27)
GFR calc Af Amer: 54 mL/min/{1.73_m2} — ABNORMAL LOW (ref >59)
GFR calc non Af Amer: 47 mL/min/{1.73_m2} — ABNORMAL LOW (ref >59)
Glucose: 260 mg/dL — ABNORMAL HIGH (ref 65–99)
Potassium: 4.4 mmol/L (ref 3.5–5.2)
Sodium: 135 mmol/L (ref 134–144)

## 2019-10-20 DIAGNOSIS — I10 Essential (primary) hypertension: Secondary | ICD-10-CM | POA: Diagnosis not present

## 2019-10-20 DIAGNOSIS — Z6830 Body mass index (BMI) 30.0-30.9, adult: Secondary | ICD-10-CM | POA: Diagnosis not present

## 2019-10-20 DIAGNOSIS — E1129 Type 2 diabetes mellitus with other diabetic kidney complication: Secondary | ICD-10-CM | POA: Diagnosis not present

## 2019-10-20 DIAGNOSIS — Z1389 Encounter for screening for other disorder: Secondary | ICD-10-CM | POA: Diagnosis not present

## 2019-10-20 DIAGNOSIS — M1991 Primary osteoarthritis, unspecified site: Secondary | ICD-10-CM | POA: Diagnosis not present

## 2019-10-20 DIAGNOSIS — Z0001 Encounter for general adult medical examination with abnormal findings: Secondary | ICD-10-CM | POA: Diagnosis not present

## 2019-10-20 DIAGNOSIS — E7849 Other hyperlipidemia: Secondary | ICD-10-CM | POA: Diagnosis not present

## 2019-10-20 DIAGNOSIS — E119 Type 2 diabetes mellitus without complications: Secondary | ICD-10-CM | POA: Diagnosis not present

## 2019-10-21 ENCOUNTER — Encounter: Payer: Medicare HMO | Admitting: *Deleted

## 2019-10-21 ENCOUNTER — Other Ambulatory Visit: Payer: Self-pay

## 2019-10-21 VITALS — BP 121/64 | HR 74 | Wt 227.8 lb

## 2019-10-21 DIAGNOSIS — I2510 Atherosclerotic heart disease of native coronary artery without angina pectoris: Secondary | ICD-10-CM

## 2019-10-21 NOTE — Research (Signed)
Patient was seen for visit 39 in [facility] gene Study. Vitals were stable. Patient using cane to ambulate.I reconciled adverse events and concomitant medications with patient.Patient is very happy with study medication and he thinks it has helped him with his heart disease. I consented patient to new addendum for Dal-Gene study. I allowed him time to ask questions. I gave him copy of consent. Patient verbalized undersatnding We are expecting study to end soon but no official date has been set. We talked about what we need to do when study ends.

## 2019-10-22 ENCOUNTER — Other Ambulatory Visit: Payer: Self-pay

## 2019-10-22 NOTE — Patient Outreach (Addendum)
[facility]) Care Management  [place]  10/22/2019   KHRIS FRISCHMAN 10-Jun-1951 [id number]  Subjective: Telephone call to patient for monthly check in.  Patient reports he is doing good.  He states that he saw Dr. Gerarda Fraction yesterday and visit went well but A1c was 8.3. Discussed diet and plan to lower A1c.  Patient denies any new problems with breathing. Discussed COPD symptoms and importance of notifying physician. He verbalized understanding and patient denies needs.  Discussed COVID-19 vaccine. Patient states he has been trying to get vaccine with [facility] and just has to call when the slots open back up. Advised patient that appointments for [facility] open on Fridays at noon.  Objective:   Encounter Medications:  Outpatient Encounter Medications as of 10/22/2019  Medication Sig  . acarbose (PRECOSE) 100 MG tablet Take 1 tablet (100 mg total) by mouth 3 (three) times daily with meals.  [name] acetaminophen (TYLENOL) 650 MG CR tablet Take 650 mg by mouth every 8 (eight) hours as needed for pain.  [name] ALPRAZolam (XANAX) 0.5 MG tablet Take 1 tablet (0.5 mg total) by mouth 3 (three) times daily as needed for anxiety or sleep.  [name] amoxicillin-clavulanate (AUGMENTIN) 875-125 MG tablet Take 1 tablet by mouth every 12 (twelve) hours.  [name] apixaban (ELIQUIS) 5 MG TABS tablet Take 5 mg by mouth 2 (two) times daily.  [name] atorvastatin (LIPITOR) 80 MG tablet Take 1 tablet (80 mg total) by mouth daily at 6 PM.  . clopidogrel (PLAVIX) 75 MG tablet Take 1 tablet (75 mg total) by mouth daily.  . collagenase (SANTYL) ointment Apply topically daily. And cover with damp to dry dressing daily. Change more frequently if soiled  . diclofenac Sodium (VOLTAREN) 1 % GEL Apply 2 g topically 4 (four) times daily.  . fish oil-omega-3 fatty acids 1000 MG capsule Take 1 g by mouth daily.   . folic acid (FOLVITE) 1 MG tablet Take 1 tablet (1 mg total) by mouth daily.  . furosemide (LASIX) 40 MG  tablet Take 2 tablets (80 mg total) by mouth 2 (two) times daily.  [name] gabapentin (NEURONTIN) 300 MG capsule Take 300 mg by mouth 3 (three) times daily.  [name] glimepiride (AMARYL) 2 MG tablet Take 1 tablet (2 mg total) by mouth daily with breakfast.  . losartan (COZAAR) 25 MG tablet Take 0.5 tablets (12.5 mg total) by mouth daily.  . metoprolol tartrate (LOPRESSOR) 25 MG tablet Take 1 tablet (25 mg total) by mouth 2 (two) times daily.  . mometasone-formoterol (DULERA) 200-5 MCG/ACT AERO Inhale 2 puffs into the lungs 2 (two) times daily.  . nitroGLYCERIN (NITROSTAT) 0.4 MG SL tablet Place 1 tablet (0.4 mg total) under the tongue every 5 (five) minutes x 3 doses as needed for chest pain.  . potassium chloride SA (KLOR-CON) 20 MEQ tablet Take 1 tablet (20 mEq total) by mouth 2 (two) times daily.  . primidone (MYSOLINE) 50 MG tablet Take 100 mg by mouth at bedtime.  . vitamin C (VITAMIN C) 250 MG tablet Take 1 tablet (250 mg total) by mouth 2 (two) times daily.   No facility-administered encounter medications on file as of 10/22/2019.    Functional Status:  In your present state of health, do you have any difficulty performing the following activities: 07/08/2019 07/04/2019  Hearing? N -  Vision? N -  Difficulty concentrating or making decisions? N -  Walking or climbing stairs? N -  Dressing or bathing? N -  Comment - -  Doing errands, shopping? N N  Preparing Food and eating ? N -  Using the Toilet? N -  In the past six months, have you accidently leaked urine? N -  Do you have problems with loss of bowel control? N -  Managing your Medications? N -  Managing your Finances? N -  Housekeeping or managing your Housekeeping? N -  Some recent data might be hidden    Fall/Depression Screening: Fall Risk  10/22/2019 10/02/2019 09/15/2019  Falls in the past year? 1 0 0  Comment Fall at son's house in January 2021- no injury - -  Number falls in past yr: 0 - -  Injury with Fall? 0 - -   PHQ 2/9  Scores 07/08/2019 06/20/2019  PHQ - 2 Score 0 0    Assessment: Patient continues to recovery and managing chronic illnesses.  Plan:  [facility] CM Care Plan Problem One     Most Recent Value  Care Plan Problem One  Knowledge deficit related to disease management of chronic condition state of COPD   Role Documenting the Problem One  Care Management Telephonic Coordinator  Care Plan for Problem One  Active  [facility] Long Term Goal   Over the next 40 days paitent will demonstrate and or verbalize understanding of self health management for long term care of COPD  [goal restated ]  [facility]N Long Term Goal Start Date  10/22/19  Interventions for Problem One Long Term Goal  Discussed patient status.  Reviewed COPD symptoms and importance of notifying physician.      RN CM will contact in the month of April and patient agreeable.  Jone Baseman, RN, MSN [place] Management Care Management Coordinator Direct Line [phone] Cell [phone] Toll Free: [phone]  Fax: [phone]

## 2019-11-09 DIAGNOSIS — I130 Hypertensive heart and chronic kidney disease with heart failure and stage 1 through stage 4 chronic kidney disease, or unspecified chronic kidney disease: Secondary | ICD-10-CM | POA: Diagnosis not present

## 2019-11-09 DIAGNOSIS — J449 Chronic obstructive pulmonary disease, unspecified: Secondary | ICD-10-CM | POA: Diagnosis not present

## 2019-11-09 DIAGNOSIS — E1122 Type 2 diabetes mellitus with diabetic chronic kidney disease: Secondary | ICD-10-CM | POA: Diagnosis not present

## 2019-11-09 DIAGNOSIS — I4891 Unspecified atrial fibrillation: Secondary | ICD-10-CM | POA: Diagnosis not present

## 2019-11-09 DIAGNOSIS — I5042 Chronic combined systolic (congestive) and diastolic (congestive) heart failure: Secondary | ICD-10-CM | POA: Diagnosis not present

## 2019-11-11 ENCOUNTER — Telehealth: Payer: Self-pay | Admitting: *Deleted

## 2019-11-11 NOTE — Telephone Encounter (Signed)
I spoke to patient to let him know Dal-Gene Study will be ending March 19 ,2021. I scheduled patient for final visit on December 04, 2019 at 11:30am with Dr. Lia Foyer. I asked patient to bring back all study medications.

## 2019-12-04 ENCOUNTER — Encounter: Payer: Medicare HMO | Admitting: *Deleted

## 2019-12-04 ENCOUNTER — Other Ambulatory Visit: Payer: Self-pay

## 2019-12-04 VITALS — BP 140/68 | HR 97 | Wt 228.0 lb

## 2019-12-04 DIAGNOSIS — I2510 Atherosclerotic heart disease of native coronary artery without angina pectoris: Secondary | ICD-10-CM

## 2019-12-04 NOTE — Research (Signed)
Patient completed his final study visit for Dal-Gene Study. Patient had his last dose of study medication on 12/02/2019 per patient. Dr.Stuckey completed his physical exam.Patient had one new adverse event with exacerbation of diabetes.  We discused his concomitant medications and updated them. Patient signed addendum to consent dated Nov. 23,2020. Patient read the informed consent and verbalized understanding of study requirements and procedures. The patient was given the opportunity to ask study related questions and had questions answered. The patient signed addendum and copy was given to patient.

## 2019-12-08 DIAGNOSIS — J449 Chronic obstructive pulmonary disease, unspecified: Secondary | ICD-10-CM | POA: Diagnosis not present

## 2019-12-10 DIAGNOSIS — E1122 Type 2 diabetes mellitus with diabetic chronic kidney disease: Secondary | ICD-10-CM | POA: Diagnosis not present

## 2019-12-10 DIAGNOSIS — I4891 Unspecified atrial fibrillation: Secondary | ICD-10-CM | POA: Diagnosis not present

## 2019-12-10 DIAGNOSIS — I130 Hypertensive heart and chronic kidney disease with heart failure and stage 1 through stage 4 chronic kidney disease, or unspecified chronic kidney disease: Secondary | ICD-10-CM | POA: Diagnosis not present

## 2019-12-10 DIAGNOSIS — I5042 Chronic combined systolic (congestive) and diastolic (congestive) heart failure: Secondary | ICD-10-CM | POA: Diagnosis not present

## 2019-12-18 ENCOUNTER — Telehealth: Payer: Self-pay | Admitting: *Deleted

## 2019-12-18 NOTE — Telephone Encounter (Signed)
Called patient today for 15 day follow-up for Dal-Gene Study. Patient completed study March 25,2021 and stopped Dal-Gene Study drug. Patient is doing well with no new adverse events.Patient blood sugars are stablizing per patient with blood sugars running 90-100. Creatinine is still above baseline but has improved. Patient will follow-up with Dr. Sallyanne Kuster on April 20,2021. Patient has completed the Dal-Gene study per protocol. I thanked patient for his participation in the study.

## 2019-12-18 NOTE — Telephone Encounter (Signed)
Thanks

## 2019-12-24 ENCOUNTER — Other Ambulatory Visit: Payer: Self-pay

## 2019-12-24 NOTE — Patient Outreach (Signed)
[facility]) Care Management  [place]  12/24/2019   Robert [name] [birth year] [id number]  Subjective: Telephone call to patient for disease management check in. Patient reports he is doing pretty good.  He states he is using cane for ambulation.  He denies any recent falls.  He states that his breathing in worse in the am but gets better as the day goes on.  He reports he has not had to use his rescue inhaler.  Discussed COPD and management.  Patient reports that his sugars are under better control now.  He is now on Toujeo and his sugars are under better control.  Fasting sugars are from 90-105.  Encouraged patient to continue working on his sugar control.  He verbalizes understanding and voices no concerns.   Objective:   Encounter Medications:  Outpatient Encounter Medications as of 12/24/2019  Medication Sig  . acarbose (PRECOSE) 100 MG tablet Take 1 tablet (100 mg total) by mouth 3 (three) times daily with meals.  [name] acetaminophen (TYLENOL) 650 MG CR tablet Take 650 mg by mouth every 8 (eight) hours as needed for pain.  [name] ALPRAZolam (XANAX) 0.5 MG tablet Take 1 tablet (0.5 mg total) by mouth 3 (three) times daily as needed for anxiety or sleep.  [name] amoxicillin-clavulanate (AUGMENTIN) 875-125 MG tablet Take 1 tablet by mouth every 12 (twelve) hours.  [name] apixaban (ELIQUIS) 5 MG TABS tablet Take 5 mg by mouth 2 (two) times daily.  [name] atorvastatin (LIPITOR) 80 MG tablet Take 1 tablet (80 mg total) by mouth daily at 6 PM.  . clopidogrel (PLAVIX) 75 MG tablet Take 1 tablet (75 mg total) by mouth daily.  . collagenase (SANTYL) ointment Apply topically daily. And cover with damp to dry dressing daily. Change more frequently if soiled  . diclofenac Sodium (VOLTAREN) 1 % GEL Apply 2 g topically 4 (four) times daily.  . fish oil-omega-3 fatty acids 1000 MG capsule Take 1 g by mouth daily.   . folic acid (FOLVITE) 1 MG tablet Take 1 tablet (1 mg total) by mouth daily.  .  furosemide (LASIX) 40 MG tablet Take 2 tablets (80 mg total) by mouth 2 (two) times daily.  [name] gabapentin (NEURONTIN) 300 MG capsule Take 300 mg by mouth 3 (three) times daily.  [name] glimepiride (AMARYL) 2 MG tablet Take 1 tablet (2 mg total) by mouth daily with breakfast.  . losartan (COZAAR) 25 MG tablet Take 0.5 tablets (12.5 mg total) by mouth daily.  . metoprolol tartrate (LOPRESSOR) 25 MG tablet Take 1 tablet (25 mg total) by mouth 2 (two) times daily.  . mometasone-formoterol (DULERA) 200-5 MCG/ACT AERO Inhale 2 puffs into the lungs 2 (two) times daily.  . nitroGLYCERIN (NITROSTAT) 0.4 MG SL tablet Place 1 tablet (0.4 mg total) under the tongue every 5 (five) minutes x 3 doses as needed for chest pain.  . potassium chloride SA (KLOR-CON) 20 MEQ tablet Take 1 tablet (20 mEq total) by mouth 2 (two) times daily.  . primidone (MYSOLINE) 50 MG tablet Take 100 mg by mouth at bedtime.  . vitamin C (VITAMIN C) 250 MG tablet Take 1 tablet (250 mg total) by mouth 2 (two) times daily.   No facility-administered encounter medications on file as of 12/24/2019.    Functional Status:  In your present state of health, do you have any difficulty performing the following activities: 07/08/2019 07/04/2019  Hearing? N -  Vision? N -  Difficulty concentrating or making decisions? N -  Walking or climbing stairs? N -  Dressing or bathing? N -  Comment - -  Doing errands, shopping? N N  Preparing Food and eating ? N -  Using the Toilet? N -  In the past six months, have you accidently leaked urine? N -  Do you have problems with loss of bowel control? N -  Managing your Medications? N -  Managing your Finances? N -  Housekeeping or managing your Housekeeping? N -  Some recent data might be hidden    Fall/Depression Screening: Fall Risk  12/24/2019 10/22/2019 10/02/2019  Falls in the past year? 1 1 0  Comment Jan 2021 Fall at son's house in January 2021- no injury -  Number falls in past yr: 0 0 -   Injury with Fall? 0 0 -  Risk for fall due to : History of fall(s) - -  Follow up Falls prevention discussed - -   PHQ 2/9 Scores 07/08/2019 06/20/2019  PHQ - 2 Score 0 0    Assessment: Patient continues to manage chronic COPD and benefits from disease management follow up.    Plan:  [facility] CM Care Plan Problem One     Most Recent Value  Care Plan Problem One  Knowledge deficit related to disease management of chronic condition state of COPD   Role Documenting the Problem One  Care Management Telephonic Coordinator  Care Plan for Problem One  Active  [facility] Long Term Goal   Over the next 90 days paitent will demonstrate and or verbalize understanding of self health management for long term care of COPD  [goal restated ]  [facility]N Long Term Goal Start Date  10/22/19  Interventions for Problem One Long Term Goal  Patient pacing self with activities and using breathing techniques for shortness of breath.  Discussed COPD zones and rescue inhalers.  Patient has not had to use rescue inhalers.       RN CM will outreach patient in the month of June and patient agreeable.   Jone Baseman, RN, MSN [place] [facility] Care Management Coordinator Direct Line [phone] Cell [phone] Toll Free: [phone]  Fax: [phone]

## 2019-12-25 ENCOUNTER — Encounter: Payer: Medicare HMO | Attending: Registered Nurse | Admitting: Physical Medicine & Rehabilitation

## 2019-12-25 ENCOUNTER — Other Ambulatory Visit: Payer: Self-pay

## 2019-12-25 ENCOUNTER — Encounter: Payer: Self-pay | Admitting: Physical Medicine & Rehabilitation

## 2019-12-25 VITALS — BP 159/68 | HR 61 | Temp 98.5°F | Ht 72.0 in | Wt 225.0 lb

## 2019-12-25 DIAGNOSIS — M25562 Pain in left knee: Secondary | ICD-10-CM | POA: Insufficient documentation

## 2019-12-25 DIAGNOSIS — M25561 Pain in right knee: Secondary | ICD-10-CM | POA: Insufficient documentation

## 2019-12-25 DIAGNOSIS — M1711 Unilateral primary osteoarthritis, right knee: Secondary | ICD-10-CM | POA: Diagnosis not present

## 2019-12-25 DIAGNOSIS — E1165 Type 2 diabetes mellitus with hyperglycemia: Secondary | ICD-10-CM | POA: Insufficient documentation

## 2019-12-25 DIAGNOSIS — M2170 Unequal limb length (acquired), unspecified site: Secondary | ICD-10-CM | POA: Insufficient documentation

## 2019-12-25 DIAGNOSIS — R269 Unspecified abnormalities of gait and mobility: Secondary | ICD-10-CM

## 2019-12-25 DIAGNOSIS — R339 Retention of urine, unspecified: Secondary | ICD-10-CM | POA: Diagnosis not present

## 2019-12-25 DIAGNOSIS — G8929 Other chronic pain: Secondary | ICD-10-CM | POA: Insufficient documentation

## 2019-12-25 DIAGNOSIS — R5381 Other malaise: Secondary | ICD-10-CM | POA: Diagnosis not present

## 2019-12-25 DIAGNOSIS — F064 Anxiety disorder due to known physiological condition: Secondary | ICD-10-CM | POA: Insufficient documentation

## 2019-12-25 NOTE — Progress Notes (Addendum)
Subjective:    Patient ID: Robert [name], male    DOB: [birth year], 69 y.o.   MRN: [id number]  HPI Male with history of CAD, ICM, PAD, SCI with LLE weakness, T2DM, RA- on methotrexate/actemra, anxiety disorder, COPD presents for follow up for debility post Covid.  Last clinic visit on 10/02/19.  Since that time, pt states he had significant improvement with knee pain after viscosupplementation injection. He is trying to do HEP. He is wearing a compression sleeve on his right knee with good benefit.  He notes his left knee hurts more than his right knee now, but it is managable. He states his skin is frail and has sores at times in his legs when he showers.  He never obtained shoe lift. Denies falls.  He has question with AFO. He states he received his Covid vaccines.  Patient with questions regarding second AFO.   Pain Inventory Average Pain 0 Pain Right Now 0 My pain is na  In the last 24 hours, has pain interfered with the following? General activity 0 Relation with others 0 Enjoyment of life 0 What TIME of day is your pain at its worst? na Sleep (in general) Good  Pain is worse with: walking Pain improves with: rest Relief from Meds: na  Mobility walk with assistance use a cane how many minutes can you walk? 5 ability to climb steps?  yes do you drive?  yes  Function disabled: date disabled 2008 retired  Neuro/Psych tingling  Prior Studies Any changes since last visit?  no x-rays CT/MRI  Physicians involved in your care Any changes since last visit?  no   Family History  Problem Relation Age of Onset  . Heart disease Father   . Colon cancer Brother        early 94s  . Hypertension Mother   . Heart disease Mother   . Colon cancer Mother        died age 62. late 49s early 20s at diagnosis.   . Lung cancer Mother    Social History   Socioeconomic History  . Marital status: Widowed    Spouse name: Not on file  . Number of children: Not on file  . Years  of education: Not on file  . Highest education level: Not on file  Occupational History  . Occupation: Clinical biochemist  Tobacco Use  . Smoking status: Current Some Day Smoker    Packs/day: 1.00    Types: Cigarettes    Last attempt to quit: 06/11/2015    Years since quitting: 4.5  . Smokeless tobacco: Never Used  . Tobacco comment: pt states smoking less than half a pack  Substance and Sexual Activity  . Alcohol use: No  . Drug use: No  . Sexual activity: Yes  Other Topics Concern  . Not on file  Social History Narrative  . Not on file   Social Determinants of Health   Financial Resource Strain:   . Difficulty of Paying Living Expenses:   Food Insecurity: No Food Insecurity  . Worried About Charity fundraiser in the Last Year: Never true  . Ran Out of Food in the Last Year: Never true  Transportation Needs: No Transportation Needs  . Lack of Transportation (Medical): No  . Lack of Transportation (Non-Medical): No  Physical Activity:   . Days of Exercise per Week:   . Minutes of Exercise per Session:   Stress: No Stress Concern Present  . Feeling of Stress :  Not at all  Social Connections:   . Frequency of Communication with Friends and Family:   . Frequency of Social Gatherings with Friends and Family:   . Attends Religious Services:   . Active Member of Clubs or Organizations:   . Attends Archivist Meetings:   [name] Marital Status:    Past Surgical History:  Procedure Laterality Date  . CARDIAC CATHETERIZATION N/A 02/01/2015   Procedure: Left Heart Cath and Coronary Angiography;  Surgeon: Belva Crome, MD;  Location: [place] [facility];  Service: Cardiovascular;  Laterality: N/A;  . CARDIAC CATHETERIZATION N/A 03/22/2015   Procedure: Left Heart Cath and Coronary Angiography;  Surgeon: Leonie Man, MD;  Location: [facility];  Service: Cardiovascular;  Laterality: N/A;  . CARDIAC CATHETERIZATION  09/05/2002; 03/09/2003; 04/10/2005;06/02/2005;  05/09/2006; 02/09/2007; 03/01/2007  . COLONOSCOPY  2008   Dr. Gala Romney: diverticulosis, hyperplastic polyp.  . CORONARY ANGIOPLASTY  11/14/2012; 02/01/2015  . CORONARY ANGIOPLASTY WITH STENT PLACEMENT  05/16/2012   "1; makes total ~ 4"  . CORONARY STENT INTERVENTION N/A 06/06/2017   Procedure: CORONARY STENT INTERVENTION;  Surgeon: Belva Crome, MD;  Location: [place];  Service: Cardiovascular;  Laterality: N/A;  . INSERTION OF MESH Bilateral 05/24/2016   Procedure: INSERTION OF MESH;  Surgeon: Coralie Keens, MD;  Location: [facility];  Service: General;  Laterality: Bilateral;  . LAPAROSCOPIC CHOLECYSTECTOMY    . LAPAROSCOPIC INGUINAL HERNIA WITH UMBILICAL HERNIA Bilateral 05/24/2016   Procedure: BILATERAL LAPAROSCOPIC INGUINAL HERNIA REPAIR WITH MESH AND UMBILICAL HERNIA REPAIR WITH MESH;  Surgeon: Coralie Keens, MD;  Location: [facility];  Service: General;  Laterality: Bilateral;  . LEFT HEART CATH AND CORONARY ANGIOGRAPHY N/A 06/06/2017   Procedure: LEFT HEART CATH AND CORONARY ANGIOGRAPHY;  Surgeon: Belva Crome, MD;  Location: [facility];  Service: Cardiovascular;  Laterality: N/A;  . LEFT HEART CATHETERIZATION WITH CORONARY ANGIOGRAM N/A 12/22/2011   Procedure: LEFT HEART CATHETERIZATION WITH CORONARY ANGIOGRAM;  Surgeon: Troy Sine, MD;  Location: [facility];  Service: Cardiovascular;  Laterality: N/A;  . LEFT HEART CATHETERIZATION WITH CORONARY ANGIOGRAM Bilateral 05/16/2012   Procedure: LEFT HEART CATHETERIZATION WITH CORONARY ANGIOGRAM;  Surgeon: Lorretta Harp, MD;  Location: [facility] C[facility] LAB;  Service: Cardiovascular;  Laterality: Bilateral;  . LEFT HEART CATHETERIZATION WITH CORONARY ANGIOGRAM N/A 11/14/2012   Procedure: LEFT HEART CATHETERIZATION WITH CORONARY ANGIOGRAM;  Surgeon: Troy Sine, MD;  Location: [facility] CATH LAB;  Service: Cardiovascular;  Laterality: N/A;  . LEFT HEART CATHETERIZATION WITH CORONARY ANGIOGRAM N/A 09/01/2013    Procedure: LEFT HEART CATHETERIZATION WITH CORONARY ANGIOGRAM;  Surgeon: Leonie Man, MD;  Location: [facility] CATH LAB;  Service: Cardiovascular;  Laterality: N/A;  . [place]; 1973; 1985   x 3  . NM MYOCAR PERF WALL MOTION  08/30/2009   No significant ischemia  . OPEN REDUCTION INTERNAL FIXATION (ORIF) DISTAL RADIAL FRACTURE Right 12/10/2016   Procedure: OPEN REDUCTION INTERNAL FIXATION (ORIF) DISTAL RADIAL FRACTURE;  Surgeon: Iran Planas, MD;  Location: [place];  Service: Orthopedics;  Laterality: Right;  . PERCUTANEOUS CORONARY INTERVENTION-BALLOON ONLY  12/22/2011   Procedure: PERCUTANEOUS CORONARY INTERVENTION-BALLOON ONLY;  Surgeon: Troy Sine, MD;  Location: [facility] CATH LAB;  Service: Cardiovascular;;  . PERCUTANEOUS CORONARY INTERVENTION-BALLOON ONLY  11/14/2012   Procedure: PERCUTANEOUS CORONARY INTERVENTION-BALLOON ONLY;  Surgeon: Troy Sine, MD;  Location: [facility] CATH LAB;  Service: Cardiovascular;;  . PERCUTANEOUS CORONARY STENT INTERVENTION (PCI-S)  05/16/2012   Procedure: PERCUTANEOUS CORONARY STENT INTERVENTION (PCI-S);  Surgeon: Lorretta Harp, MD;  Location: [facility] CATH LAB;  Service: Cardiovascular;;  . PERCUTANEOUS CORONARY STENT INTERVENTION (PCI-S)  09/01/2013   Procedure: PERCUTANEOUS CORONARY STENT INTERVENTION (PCI-S);  Surgeon: Leonie Man, MD;  Location: [facility] CATH LAB;  Service: Cardiovascular;;   Past Medical History:  Diagnosis Date  . Anxiety   . CHF (congestive heart failure) ([place])   . Collagen vascular disease ([place])   . COPD (chronic obstructive pulmonary disease) ([place])   . Coronary artery disease   . COVID-19   . Full dentures   . GERD (gastroesophageal reflux disease)    Rolaids as needed  . High cholesterol   . History of MI (myocardial infarction)   . Hypertension    med. dosage increased 04/2016; has been on BP med. x 10 yrs.  . Incarcerated umbilical hernia 0000000  . Inguinal hernia 05/2016   bilateral   . Insulin dependent diabetes  mellitus   . Ischemic cardiomyopathy    a. 03/2015 EF 40-45% by LV gram.  . Left leg cellulitis 10/08/2016  . Rheumatoid arthritis(714.0)    BP (!) 159/68   Pulse 61   Temp 98.5 F (36.9 C)   Ht 6' (1.829 m)   Wt 225 lb (102.1 kg)   SpO2 95%   BMI 30.52 kg/m   Opioid Risk Score:   Fall Risk Score:  `1  Depression screen PHQ 2/9  Depression screen [facility] 2/9 07/08/2019 06/20/2019  Decreased Interest 0 0  Down, Depressed, Hopeless 0 0  PHQ - 2 Score 0 0  Some recent data might be hidden   Review of Systems  Constitutional: Negative.   HENT: Negative.   Eyes: Negative.   Respiratory: Negative.   Cardiovascular: Negative.   Gastrointestinal: Negative.   Endocrine: Negative.   Genitourinary: Negative.   Musculoskeletal: Positive for arthralgias and gait problem.  Skin: Negative.   Allergic/Immunologic: Negative.   Hematological: Negative.   Psychiatric/Behavioral: Negative.       Objective:   Physical Exam  Constitutional: No distress .  Respiratory: Normal effort.   Psych: Normal mood.  Normal behavior. Musc: Gait: Slow cadence Neurological: Alert Skin: open ulcer on left distal tibia     Assessment & Plan:  Male with history of CAD, ICM, PAD, SCI with LLE weakness, T2DM, RA- on methotrexate/actemra, anxiety disorder, COPD presents for follow up for debility post Covid.  1.  Functional and mobility deficits secondary to debility after Covid respiratory failure  Continue HEP  2. Pain Management:   Related to RA b/l knees  No benefit with Voltaren gel  Xrays of knees personally reviewed, right Mod OA, left Mild OA  Good benefit with compression sleeve - encouraged occasion use vs constant  Very good benefit with Monovisc injection on 1/4  Will refer for shoe lift for right leg, reminded patient to follow up again  3. Gait abnormality  Continue HEP  Continue cane for safety PRN, less reliant  Continue AFO  4. Left leg ulcer  Continue Santyl  Being followed  up by PCP

## 2019-12-26 DIAGNOSIS — H25813 Combined forms of age-related cataract, bilateral: Secondary | ICD-10-CM | POA: Diagnosis not present

## 2019-12-26 DIAGNOSIS — Z7984 Long term (current) use of oral hypoglycemic drugs: Secondary | ICD-10-CM | POA: Diagnosis not present

## 2019-12-26 DIAGNOSIS — H524 Presbyopia: Secondary | ICD-10-CM | POA: Diagnosis not present

## 2019-12-26 DIAGNOSIS — H5213 Myopia, bilateral: Secondary | ICD-10-CM | POA: Diagnosis not present

## 2019-12-26 DIAGNOSIS — H52223 Regular astigmatism, bilateral: Secondary | ICD-10-CM | POA: Diagnosis not present

## 2019-12-26 DIAGNOSIS — Z794 Long term (current) use of insulin: Secondary | ICD-10-CM | POA: Diagnosis not present

## 2019-12-26 DIAGNOSIS — E113393 Type 2 diabetes mellitus with moderate nonproliferative diabetic retinopathy without macular edema, bilateral: Secondary | ICD-10-CM | POA: Diagnosis not present

## 2019-12-30 ENCOUNTER — Encounter: Payer: Self-pay | Admitting: Cardiovascular Disease

## 2019-12-30 ENCOUNTER — Ambulatory Visit: Payer: Medicare HMO | Admitting: Cardiovascular Disease

## 2019-12-30 ENCOUNTER — Other Ambulatory Visit: Payer: Self-pay

## 2019-12-30 ENCOUNTER — Encounter: Payer: Self-pay | Admitting: *Deleted

## 2019-12-30 VITALS — BP 160/78 | HR 64 | Temp 97.3°F | Resp 12 | Ht 72.0 in | Wt 228.2 lb

## 2019-12-30 DIAGNOSIS — I2510 Atherosclerotic heart disease of native coronary artery without angina pectoris: Secondary | ICD-10-CM | POA: Diagnosis not present

## 2019-12-30 DIAGNOSIS — I10 Essential (primary) hypertension: Secondary | ICD-10-CM

## 2019-12-30 DIAGNOSIS — E1151 Type 2 diabetes mellitus with diabetic peripheral angiopathy without gangrene: Secondary | ICD-10-CM

## 2019-12-30 DIAGNOSIS — I5032 Chronic diastolic (congestive) heart failure: Secondary | ICD-10-CM | POA: Diagnosis not present

## 2019-12-30 DIAGNOSIS — I480 Paroxysmal atrial fibrillation: Secondary | ICD-10-CM | POA: Diagnosis not present

## 2019-12-30 DIAGNOSIS — M05719 Rheumatoid arthritis with rheumatoid factor of unspecified shoulder without organ or systems involvement: Secondary | ICD-10-CM | POA: Diagnosis not present

## 2019-12-30 DIAGNOSIS — I739 Peripheral vascular disease, unspecified: Secondary | ICD-10-CM

## 2019-12-30 DIAGNOSIS — J431 Panlobular emphysema: Secondary | ICD-10-CM

## 2019-12-30 DIAGNOSIS — E785 Hyperlipidemia, unspecified: Secondary | ICD-10-CM

## 2019-12-30 DIAGNOSIS — Z794 Long term (current) use of insulin: Secondary | ICD-10-CM

## 2019-12-30 MED ORDER — ATORVASTATIN CALCIUM 80 MG PO TABS: 80.0000 mg | ORAL_TABLET | Freq: Every day | ORAL | 1 refills | Status: AC

## 2019-12-30 MED ORDER — FUROSEMIDE 40 MG PO TABS: 80.0000 mg | ORAL_TABLET | Freq: Every day | ORAL | 1 refills | Status: DC

## 2019-12-30 NOTE — Patient Instructions (Signed)
Medication Instructions:  No changes *If you need a refill on your cardiac medications before your next appointment, please call your pharmacy*   Lab Work: None ordered If you have labs (blood work) drawn today and your tests are completely normal, you will receive your results only by: [name] MyChart Message (if you have MyChart) OR . A paper copy in the mail If you have any lab test that is abnormal or we need to change your treatment, we will call you to review the results.   Testing/Procedures: [employer] Cardiac Event Monitor Instructions Your physician has requested you wear your cardiac event monitor for 30 days, (1-30). [employer] may call or text to confirm a shipping address. The monitor will be sent to a land address via UPS. [employer] will not ship a monitor to a PO BOX. It typically takes 3-5 days to receive your monitor after it has been enrolled. [employer] will assist with USPS tracking if your package is delayed. The telephone number for [employer] is [phone]. Once you have received your monitor, please review the enclosed instructions. Instruction tutorials can also be viewed under help and settings on the enclosed cell phone. Your monitor has already been registered assigning a specific monitor serial # to you.  Applying the monitor Remove cell phone from case and turn it on. The cell phone works as Dealer and needs to be within [employer] of you at all times. The cell phone will need to be charged on a daily basis. We recommend you plug the cell phone into the enclosed charger at your bedside table every night.  Monitor batteries: You will receive two monitor batteries labelled #1 and #2. These are your recorders. Plug battery #2 onto the second connection on the enclosed charger. Keep one battery on the charger at all times. This will keep the monitor battery deactivated. It will also keep it fully charged for when you need to switch your monitor  batteries. A small light will be blinking on the battery emblem when it is charging. The light on the battery emblem will remain on when the battery is fully charged.  Open package of a Monitor strip. Insert battery #1 into black hood on strip and gently squeeze monitor battery onto connection as indicated in instruction booklet. Set aside while preparing skin.  Choose location for your strip, vertical or horizontal, as indicated in the instruction booklet. Shave to remove all hair from location. There cannot be any lotions, oils, powders, or colognes on skin where monitor is to be applied. Wipe skin clean with enclosed Saline wipe. Dry skin completely.  Peel paper labeled #1 off the back of the Monitor strip exposing the adhesive. Place the monitor on the chest in the vertical or horizontal position shown in the instruction booklet. One arrow on the monitor strip must be pointing upward. Carefully remove paper labeled #2, attaching remainder of strip to your skin. Try not to create any folds or wrinkles in the strip as you apply it.  Firmly press and release the circle in the center of the monitor battery. You will hear a small beep. This is turning the monitor battery on. The heart emblem on the monitor battery will light up every 5 seconds if the monitor battery in turned on and connected to the patient securely. Do not push and hold the circle down as this turns the monitor battery off. The cell phone will locate the monitor battery. A screen will appear on the cell phone checking the  connection of your monitor strip. This may read poor connection initially but change to good connection within the next minute. Once your monitor accepts the connection you will hear a series of 3 beeps followed by a climbing crescendo of beeps. A screen will appear on the cell phone showing the two monitor strip placement options. Touch the picture that demonstrates where you applied the monitor  strip.  Your monitor strip and battery are waterproof. You are able to shower, bathe, or swim with the monitor on. They just ask you do not submerge deeper than 3 feet underwater. We recommend removing the monitor if you are swimming in a lake, river, or ocean.  Your monitor battery will need to be switched to a fully charged monitor battery approximately once a week. The cell phone will alert you of an action which needs to be made.  On the cell phone, tap for details to reveal connection status, monitor battery status, and cell phone battery status. The green dots indicates your monitor is in good status. A red dot indicates there is something that needs your attention.  To record a symptom, click the circle on the monitor battery. In 30-60 seconds a list of symptoms will appear on the cell phone. Select your symptom and tap save. Your monitor will record a sustained or significant arrhythmia regardless of you clicking the button. Some patients do not feel the heart rhythm irregularities. [employer] will notify us of any serious or critical events.  Refer to instruction booklet for instructions on switching batteries, changing strips, the Do not disturb or Pause features, or any additional questions.  Call [employer] at [phone], to confirm your monitor is transmitting and record your baseline. They will answer any questions you may have regarding the monitor instructions at that time.  Returning the monitor to [place] all equipment back into blue box. Peel off strip of paper to expose adhesive and close box securely. There is a prepaid UPS shipping label on this box. Drop in a UPS drop box, or at a UPS facility like [employer]. You may also contact [employer] to arrange UPS to pick up monitor package at your home.    Follow-Up: At [facility], you and your health needs are our priority.  As part of our continuing mission to provide you with exceptional heart care, we  have created designated Provider Care Teams.  These Care Teams include your primary Cardiologist (physician) and Advanced Practice Providers (APPs -  Physician Assistants and Nurse Practitioners) who all work together to provide you with the care you need, when you need it.  We recommend signing up for the patient portal called "MyChart".  Sign up information is provided on this After Visit Summary.  MyChart is used to connect with patients for Virtual Visits (Telemedicine).  Patients are able to view lab/test results, encounter notes, upcoming appointments, etc.  Non-urgent messages can be sent to your provider as well.   To learn more about what you can do with MyChart, go to [email or web address].    Your next appointment:   6 month(s)  The format for your next appointment:   In Person  Provider:   You may see Sanda Klein, MD or one of the following Advanced Practice Providers on your designated Care Team:    Almyra Deforest, PA-C  Fabian Sharp, PA-C or   Roby Lofts, [place]

## 2019-12-30 NOTE — Progress Notes (Signed)
Patient ID: Robert [name], male   DOB: [birth year], 69 y.o.   MRN: [id number] Patient enrolled for [employer] to ship a 30 day cardiac event monitor to his home.

## 2019-12-30 NOTE — Progress Notes (Signed)
Cardiology Office Note    Date:  01/04/2020   ID:  Robert [name], DOB 02-Feb-1951, MRN [id number]  PCP:  Redmond School, MD  Cardiologist:   Sanda Klein, MD   Chief complaint: CAD, CHF   History of Present Illness:  Robert [name] is a 69 y.o. male with premature onset coronary artery disease and frequent problems with in-stent restenosis after multiple interventions in the left circumflex coronary artery, most recently presenting with late stent thrombosis in September 2018 with non-ST segment elevation myocardial infarction, treated with plain angioplasty without placement of new stent.  He is also known to have chronic total occlusion of the right coronary artery.  He has a 60% stenosis in the proximal LAD and a 60% stenosis in the ostial first diagonal artery.  He was briefly hospitalized in December with heart failure exacerbation.  He is generally doing well since then and denies any problems with angina at rest or with activity.  He continues to have NYHA functional class II exertional dyspnea that does not interfere with daily activity and has chronic mild swelling in his left foot.    Rheumatoid arthritis symptoms are well controlled on Actemra and methotrexate.  He is tolerating the clopidogrel without a rash.    Unfortunately, his wife passed away in February 11, 2023.  He is still clearly grieving from that.  She had complications of an abdominal infection and was treated at [facility].  Fortunately this happened during the coronavirus pandemic, which made it hard for him to be by her side during her hospitalization.  She eventually died at home.   In 04/13/2019 developed COVID-19 infection was extremely ill.  He was intubated for a long period of time and had a total of 58 days of hospitalization.  During that time he had paroxysmal atrial fibrillation and was started on anticoagulants.  After discharge and rehab he was readmitted with suspected aspiration pneumonia in  October 2020 and has recovered from that as well.  He has lost a lot of weight, but continues to try to have a positive outlook.  He is not smoking.  He is living with his stepson..   Diabetes control has improved and his last hemoglobin A1c was close to target at 7.3% (much better than 9% last September).  His hemoglobin is normal and his creatinine is 0.6. He is on maximum dose atorvastatin.  His HDL is still low at 23.  He was enrolled in the dalcetrapib DALGENE trial, but that trial has recently ended..                      Bypass surgery was considered , but it was felt that he would likely not benefit of this procedure since the distal right coronary artery was a poor target, the left circumflex coronary artery territory is mostly infarcted and the LAD stenosis is not critical.  In addition he developed severe thrombocytopenia during that hospital stay (platelets down to 38,000), possibly due to the use of Aggrastat.  By the time of hospital discharge his platelet count had recovered slightly to 58,000.  An additional disincentive to bypass surgery the fact that he has undergone left leg vein stripping, although he still appears to have adequate veins for bypass in his right lower extremity. Left ventricular ejection fraction has dropped as a result of his most recent infarction, now down to 40-45%.  At the time of left heart catheterization LVEDP was mildly increased to  19 mmHg. Additional medical problems include PAD, insulin requiring diabetes mellitus, dyslipidemia with very low HDL and rheumatoid arthritis.  He has been managed for years with methotrexate, which may also be partly responsible for tendency to pancytopenia), but now is on Actemra monotherapy with excellent clinical response.   summary of coronary interventions: -cath 2004 showed total occlusion of RCA and intermediate lesions LAD, high grade mid LCX treated with 4x18 mm BMS (Velocity).  - 2006 overlapping 3.5x23 mm Cypher inLCX   - 2007 new downstream stenosis at OM bifurcation treated with 3x18 mm Cypher stent  - 2008 acute stent thrombosis (allergic to plavix, temporarily stopped Ticlid) - thrombolysis/aspiration only  - April 2013 cutting balloon angioplasty for proximal LCX in stent restenosis (Dr. Claiborne Billings)  - September 2013 repeat restenosis treated with "stent in stent" Xience Expedition 3.25 x 23 mm (Dr. Gwenlyn Found).  - March 2014 left circumflex coronary artery PCI for restenosis, utilizing cutting balloon atherotomy/noncompliant balloon dilatation  - December 2014  Thrombotic occlusion of restenotic proximal left circumflex stent , new Promus Premier DES 3.5 mm x 24 mm; overlapping the proximal and distal stents -  May 2016 cutting balloon angioplasty for in-stent restenosis in the proximal left circumflex coronary artery -  July 2016 non STEMI ,  coronary angiogram unchanged,no intervention performed -  September 2018 non-STEMI, acute late thrombosis of the left circumflex stent, treated with angioplasty alone (Dr. Tamala Julian) -  No change in RCA or LAD anatomy during last several years.  -  Allergic to iodinated contrast, but did well with premedication. Allergic to clopidogrel but tolerates Brilinta and Effient well.   Past Medical History:  Diagnosis Date  . Anxiety   . CHF (congestive heart failure) ([place])   . Collagen vascular disease ([place])   . COPD (chronic obstructive pulmonary disease) ([place])   . Coronary artery disease   . COVID-19   . Full dentures   . GERD (gastroesophageal reflux disease)    Rolaids as needed  . High cholesterol   . History of MI (myocardial infarction)   . Hypertension    med. dosage increased 04/2016; has been on BP med. x 10 yrs.  . Incarcerated umbilical hernia 0000000  . Inguinal hernia 05/2016   bilateral   . Insulin dependent diabetes mellitus   . Ischemic cardiomyopathy    a. 03/2015 EF 40-45% by LV gram.  . Left leg cellulitis 10/08/2016  . Rheumatoid arthritis(714.0)      Past Surgical History:  Procedure Laterality Date  . CARDIAC CATHETERIZATION N/A 02/01/2015   Procedure: Left Heart Cath and Coronary Angiography;  Surgeon: Belva Crome, MD;  Location: [place] [facility];  Service: Cardiovascular;  Laterality: N/A;  . CARDIAC CATHETERIZATION N/A 03/22/2015   Procedure: Left Heart Cath and Coronary Angiography;  Surgeon: Leonie Man, MD;  Location: [place] [facility];  Service: Cardiovascular;  Laterality: N/A;  . CARDIAC CATHETERIZATION  09/05/2002; 03/09/2003; 04/10/2005;06/02/2005; 05/09/2006; 02/09/2007; 03/01/2007  . COLONOSCOPY  2008   Dr. Gala Romney: diverticulosis, hyperplastic polyp.  . CORONARY ANGIOPLASTY  11/14/2012; 02/01/2015  . CORONARY ANGIOPLASTY WITH STENT PLACEMENT  05/16/2012   "1; makes total ~ 4"  . CORONARY STENT INTERVENTION N/A 06/06/2017   Procedure: CORONARY STENT INTERVENTION;  Surgeon: Belva Crome, MD;  Location: [place] [facility];  Service: Cardiovascular;  Laterality: N/A;  . INSERTION OF MESH Bilateral 05/24/2016   Procedure: INSERTION OF MESH;  Surgeon: Coralie Keens, MD;  Location: [place];  Service: General;  Laterality: Bilateral;  . LAPAROSCOPIC CHOLECYSTECTOMY    . LAPAROSCOPIC INGUINAL HERNIA WITH UMBILICAL HERNIA Bilateral 05/24/2016   Procedure: BILATERAL LAPAROSCOPIC INGUINAL HERNIA REPAIR WITH MESH AND UMBILICAL HERNIA REPAIR WITH MESH;  Surgeon: Coralie Keens, MD;  Location: [place];  Service: General;  Laterality: Bilateral;  . LEFT HEART CATH AND CORONARY ANGIOGRAPHY N/A 06/06/2017   Procedure: LEFT HEART CATH AND CORONARY ANGIOGRAPHY;  Surgeon: Belva Crome, MD;  Location: [place] [facility];  Service: Cardiovascular;  Laterality: N/A;  . LEFT HEART CATHETERIZATION WITH CORONARY ANGIOGRAM N/A 12/22/2011   Procedure: LEFT HEART CATHETERIZATION WITH CORONARY ANGIOGRAM;  Surgeon: Troy Sine, MD;  Location[facility] C[facility] LAB;  Service: Cardiovascular;  Laterality: N/A;  . LEFT HEART  CATHETERIZATION WITH CORONARY ANGIOGRAM Bilateral 05/16/2012   Procedure: LEFT HEART CATHETERIZATION WITH CORONARY ANGIOGRAM;  Surgeon: Lorretta Harp, MD;  Location: [facility] CATH LAB;  Service: Cardiovascular;  Laterality: Bilateral;  . LEFT HEART CATHETERIZATION WITH CORONARY ANGIOGRAM N/A 11/14/2012   Procedure: LEFT HEART CATHETERIZATION WITH CORONARY ANGIOGRAM;  Surgeon: Troy Sine, MD;  Location: [facility] CATH LAB;  Service: Cardiovascular;  Laterality: N/A;  . LEFT HEART CATHETERIZATION WITH CORONARY ANGIOGRAM N/A 09/01/2013   Procedure: LEFT HEART CATHETERIZATION WITH CORONARY ANGIOGRAM;  Surgeon: Leonie Man, MD;  Location: [facility] CATH LAB;  Service: Cardiovascular;  Laterality: N/A;  . [place]; 1973; 1985   x 3  . NM MYOCAR PERF WALL MOTION  08/30/2009   No significant ischemia  . OPEN REDUCTION INTERNAL FIXATION (ORIF) DISTAL RADIAL FRACTURE Right 12/10/2016   Procedure: OPEN REDUCTION INTERNAL FIXATION (ORIF) DISTAL RADIAL FRACTURE;  Surgeon: Iran Planas, MD;  Location: [place];  Service: Orthopedics;  Laterality: Right;  . PERCUTANEOUS CORONARY INTERVENTION-BALLOON ONLY  12/22/2011   Procedure: PERCUTANEOUS CORONARY INTERVENTION-BALLOON ONLY;  Surgeon: Troy Sine, MD;  Location: [facility] CATH LAB;  Service: Cardiovascular;;  . PERCUTANEOUS CORONARY INTERVENTION-BALLOON ONLY  11/14/2012   Procedure: PERCUTANEOUS CORONARY INTERVENTION-BALLOON ONLY;  Surgeon: Troy Sine, MD;  Location: [facility] CATH LAB;  Service: Cardiovascular;;  . PERCUTANEOUS CORONARY STENT INTERVENTION (PCI-S)  05/16/2012   Procedure: PERCUTANEOUS CORONARY STENT INTERVENTION (PCI-S);  Surgeon: Lorretta Harp, MD;  Location: [place] CATH LAB;  Service: Cardiovascular;;  . PERCUTANEOUS CORONARY STENT INTERVENTION (PCI-S)  09/01/2013   Procedure: PERCUTANEOUS CORONARY STENT INTERVENTION (PCI-S);  Surgeon: Leonie Man, MD;  Location: [facility] CATH LAB;  Service: Cardiovascular;;    Current Medications: Outpatient Medications  Prior to Visit  Medication Sig Dispense Refill  . acarbose (PRECOSE) 100 MG tablet Take 1 tablet (100 mg total) by mouth 3 (three) times daily with meals. 90 tablet 0  . acetaminophen (TYLENOL) 650 MG CR tablet Take 650 mg by mouth every 8 (eight) hours as needed for pain.    [name] ALPRAZolam (XANAX) 0.5 MG tablet Take 1 tablet (0.5 mg total) by mouth 3 (three) times daily as needed for anxiety or sleep. 45 tablet 0  . apixaban (ELIQUIS) 5 MG TABS tablet Take 5 mg by mouth 2 (two) times daily.    . clopidogrel (PLAVIX) 75 MG tablet Take 1 tablet (75 mg total) by mouth daily. 90 tablet 3  . collagenase (SANTYL) ointment Apply topically daily. And cover with damp to dry dressing daily. Change more frequently if soiled 30 g 1  . fish oil-omega-3 fatty acids 1000 MG capsule Take 1 g by mouth daily.     . fluticasone (FLONASE) 50 MCG/ACT nasal spray     .  folic acid (FOLVITE) 1 MG tablet Take 1 tablet (1 mg total) by mouth daily. 30 tablet 0  . gabapentin (NEURONTIN) 300 MG capsule Take 300 mg by mouth 3 (three) times daily.    [name] glimepiride (AMARYL) 2 MG tablet Take 1 tablet (2 mg total) by mouth daily with breakfast. 30 tablet 0  . Insulin Glargine (TOUJEO SOLOSTAR [place]) Inject into the skin. 25 units    . losartan (COZAAR) 25 MG tablet Take 0.5 tablets (12.5 mg total) by mouth daily. 90 tablet 3  . methotrexate 50 MG/2ML injection     . metoprolol tartrate (LOPRESSOR) 25 MG tablet Take 1 tablet (25 mg total) by mouth 2 (two) times daily. 180 tablet 3  . mometasone-formoterol (DULERA) 200-5 MCG/ACT AERO Inhale 2 puffs into the lungs 2 (two) times daily. 13 g 0  . nitroGLYCERIN (NITROSTAT) 0.4 MG SL tablet Place 1 tablet (0.4 mg total) under the tongue every 5 (five) minutes x 3 doses as needed for chest pain. 25 tablet 2  . potassium chloride SA (KLOR-CON) 20 MEQ tablet Take 1 tablet (20 mEq total) by mouth 2 (two) times daily. 60 tablet 1  . primidone (MYSOLINE) 50 MG tablet Take 100 mg by mouth at  bedtime.    . terbinafine (LAMISIL) 250 MG tablet Take 250 mg by mouth daily.    . vitamin C (VITAMIN C) 250 MG tablet Take 1 tablet (250 mg total) by mouth 2 (two) times daily. 60 tablet 0  . atorvastatin (LIPITOR) 80 MG tablet Take 1 tablet (80 mg total) by mouth daily at 6 PM. 30 tablet 0  . diclofenac Sodium (VOLTAREN) 1 % GEL Apply 2 g topically 4 (four) times daily. 350 g 1  . furosemide (LASIX) 40 MG tablet Take 2 tablets (80 mg total) by mouth 2 (two) times daily. 180 tablet 3  . amoxicillin-clavulanate (AUGMENTIN) 875-125 MG tablet Take 1 tablet by mouth every 12 (twelve) hours. 4 tablet 0   No facility-administered medications prior to visit.     Allergies:   Ivp dye [iodinated diagnostic agents]   Social History   Socioeconomic History  . Marital status: Widowed    Spouse name: Not on file  . Number of children: Not on file  . Years of education: Not on file  . Highest education level: Not on file  Occupational History  . Occupation: Clinical biochemist  Tobacco Use  . Smoking status: Current Some Day Smoker    Packs/day: 1.00    Types: Cigarettes    Last attempt to quit: 06/11/2015    Years since quitting: 4.5  . Smokeless tobacco: Never Used  . Tobacco comment: pt states smoking less than half a pack  Substance and Sexual Activity  . Alcohol use: No  . Drug use: No  . Sexual activity: Yes  Other Topics Concern  . Not on file  Social History Narrative  . Not on file   Social Determinants of Health   Financial Resource Strain:   . Difficulty of Paying Living Expenses:   Food Insecurity: No Food Insecurity  . Worried About Charity fundraiser in the Last Year: Never true  . Ran Out of Food in the Last Year: Never true  Transportation Needs: No Transportation Needs  . Lack of Transportation (Medical): No  . Lack of Transportation (Non-Medical): No  Physical Activity:   . Days of Exercise per Week:   . Minutes of Exercise per Session:   Stress: No  Stress  Concern Present  . Feeling of Stress : Not at all  Social Connections:   . Frequency of Communication with Friends and Family:   . Frequency of Social Gatherings with Friends and Family:   . Attends Religious Services:   . Active Member of Clubs or Organizations:   . Attends Archivist Meetings:   [name] Marital Status:      Family History:  The patient's family history includes Colon cancer in his brother and mother; Heart disease in his father and mother; Hypertension in his mother; Lung cancer in his mother.   ROS:   Please see the history of present illness.    ROS All other systems are reviewed and are negative.   PHYSICAL EXAM:   VS:  BP (!) 160/78   Pulse 64   Temp (!) 97.3 F (36.3 C)   Resp 12   Ht 6' (1.829 m)   Wt 228 lb 3.2 oz (103.5 kg)   SpO2 96%   BMI 30.95 kg/m      General: Alert, oriented x3, no distress, mildly obese Head: no evidence of trauma, PERRL, EOMI, no exophtalmos or lid lag, no myxedema, no xanthelasma; normal ears, nose and oropharynx Neck: normal jugular venous pulsations and no hepatojugular reflux; brisk carotid pulses without delay and no carotid bruits Chest: clear to auscultation, no signs of consolidation by percussion or palpation, normal fremitus, symmetrical and full respiratory excursions Cardiovascular: normal position and quality of the apical impulse, regular rhythm, normal first and second heart sounds, no murmurs, rubs or gallops Abdomen: no tenderness or distention, no masses by palpation, no abnormal pulsatility or arterial bruits, normal bowel sounds, no hepatosplenomegaly Extremities: no clubbing, cyanosis; trivial edema left ankle; scars of venous stripping left calf.  2+ radial, ulnar and brachial pulses bilaterally; 2+ right femoral, posterior tibial and dorsalis pedis pulses; 2+ left femoral, posterior tibial and dorsalis pedis pulses; no subclavian or femoral bruits Neurological: grossly nonfocal Psych: Normal  mood and affect   Wt Readings from Last 3 Encounters:  12/30/19 228 lb 3.2 oz (103.5 kg)  12/25/19 225 lb (102.1 kg)  12/04/19 228 lb (103.4 kg)      Studies/Labs Reviewed:   EKG:  EKG is ordered today shows sinus rhythm with rare PACs and no ischemic repolarization abnormalities Recent Labs: 04/18/2019: TSH 0.927 07/03/2019: B Natriuretic Peptide 346.0 07/05/2019: ALT 26 07/06/2019: Hemoglobin 10.9; Platelets 187 07/07/2019: Magnesium 2.1 10/17/2019: BUN 34; Creatinine, Ser 1.52; Potassium 4.4; Sodium 135   Lipid Panel    Component Value Date/Time   CHOL 83 05/18/2019 0112   TRIG 57 05/18/2019 0112   HDL 28 (L) 05/18/2019 0112   CHOLHDL 3.0 05/18/2019 0112   VLDL 11 05/18/2019 0112   LDLCALC 44 05/18/2019 0112   ASSESSMENT:    1. Paroxysmal atrial fibrillation (HCC)   2. Coronary artery disease involving native coronary artery of native heart without angina pectoris   3. Chronic diastolic heart failure ([place])   4. PAD (peripheral artery disease) ([place])   5. Essential hypertension   6. Panlobular emphysema ([place])   7. Dyslipidemia (high LDL; low HDL)   8. Controlled type 2 diabetes mellitus with diabetic peripheral angiopathy without gangrene, with long-term current use of insulin ([place])   9. Rheumatoid arthritis involving shoulder with positive rheumatoid factor, unspecified laterality ([place])      PLAN:  In order of problems listed above:  1. CAD: After years of struggling with recurrent restenosis in the left circumflex coronary system he  has probably infarcted most of the posterior wall and he no longer has angina pectoris.  He is on clopidogrel therapy in addition to Eliquis.  He is not on aspirin due to increased bleeding risk. 2. CHF: Appears clinically euvolemic.  As always his left ankle is a little swollen but otherwise no signs of fluid excess.  Has mild chronic dyspnea related to COPD. 3. PAD: He has extensive obstructive disease in the lower extremities, but does  not have claudication..  He has known chronic occlusion of the left popliteal artery with collateral formation and three-vessel runoff.  Normal right ABI.  Left ABI 0.61 (September 2018).  This was evaluated by one of our vascular specialists and felt to be best suited for medical therapy.  September 2018 bilateral carotid artery duplex ultrasound was normal. 4. Parox AFib: He is asymptomatic.  He wonders whether he needs to be on lifelong anticoagulation.  His atrial fibrillation did occur during severe COVID-19 infection of the respiratory failure.  We will check an event monitor before deciding whether we could stop his Eliquis. 5. HTN: Blood pressure was high today, but this is atypical. 6. COPD: Does not have any wheezing today. 7. HLP: Excellent LDL.  His HDL is always low.  He was enrolled in the Dallergy trial which has now terminated.  Explained that we still do not have a commercially available effective drug to increase HDL cholesterol. 8. DM: Complicated by neuropathy and peripheral angiopathy.  After substantial weight loss he is now only on low-dose glimepiride and a reduced dose of Lantus.  I think he remains an excellent candidate for SGLT2 inhibitor therapy. 9. RA: On methotrexate.    Medication Adjustments/Labs and Tests Ordered: Current medicines are reviewed at length with the patient today.  Concerns regarding medicines are outlined above.  Medication changes, Labs and Tests ordered today are listed in the Patient Instructions below. Patient Instructions  Medication Instructions:  No changes *If you need a refill on your cardiac medications before your next appointment, please call your pharmacy*   Lab Work: None ordered If you have labs (blood work) drawn today and your tests are completely normal, you will receive your results only by: [name] MyChart Message (if you have MyChart) OR . A paper copy in the mail If you have any lab test that is abnormal or we need to change your  treatment, we will call you to review the results.   Testing/Procedures: [employer] Cardiac Event Monitor Instructions Your physician has requested you wear your cardiac event monitor for 30 days, (1-30). [employer] may call or text to confirm a shipping address. The monitor will be sent to a land address via UPS. [employer] will not ship a monitor to a PO BOX. It typically takes 3-5 days to receive your monitor after it has been enrolled. [employer] will assist with USPS tracking if your package is delayed. The telephone number for [employer] is [phone]. Once you have received your monitor, please review the enclosed instructions. Instruction tutorials can also be viewed under help and settings on the enclosed cell phone. Your monitor has already been registered assigning a specific monitor serial # to you.  Applying the monitor Remove cell phone from case and turn it on. The cell phone works as Dealer and needs to be within [employer] of you at all times. The cell phone will need to be charged on a daily basis. We recommend you plug the cell phone into the enclosed charger at your bedside  table every night.  Monitor batteries: You will receive two monitor batteries labelled #1 and #2. These are your recorders. Plug battery #2 onto the second connection on the enclosed charger. Keep one battery on the charger at all times. This will keep the monitor battery deactivated. It will also keep it fully charged for when you need to switch your monitor batteries. A small light will be blinking on the battery emblem when it is charging. The light on the battery emblem will remain on when the battery is fully charged.  Open package of a Monitor strip. Insert battery #1 into black hood on strip and gently squeeze monitor battery onto connection as indicated in instruction booklet. Set aside while preparing skin.  Choose location for your strip, vertical or horizontal, as indicated in  the instruction booklet. Shave to remove all hair from location. There cannot be any lotions, oils, powders, or colognes on skin where monitor is to be applied. Wipe skin clean with enclosed Saline wipe. Dry skin completely.  Peel paper labeled #1 off the back of the Monitor strip exposing the adhesive. Place the monitor on the chest in the vertical or horizontal position shown in the instruction booklet. One arrow on the monitor strip must be pointing upward. Carefully remove paper labeled #2, attaching remainder of strip to your skin. Try not to create any folds or wrinkles in the strip as you apply it.  Firmly press and release the circle in the center of the monitor battery. You will hear a small beep. This is turning the monitor battery on. The heart emblem on the monitor battery will light up every 5 seconds if the monitor battery in turned on and connected to the patient securely. Do not push and hold the circle down as this turns the monitor battery off. The cell phone will locate the monitor battery. A screen will appear on the cell phone checking the connection of your monitor strip. This may read poor connection initially but change to good connection within the next minute. Once your monitor accepts the connection you will hear a series of 3 beeps followed by a climbing crescendo of beeps. A screen will appear on the cell phone showing the two monitor strip placement options. Touch the picture that demonstrates where you applied the monitor strip.  Your monitor strip and battery are waterproof. You are able to shower, bathe, or swim with the monitor on. They just ask you do not submerge deeper than 3 feet underwater. We recommend removing the monitor if you are swimming in a lake, river, or ocean.  Your monitor battery will need to be switched to a fully charged monitor battery approximately once a week. The cell phone will alert you of an action which needs to be made.  On the  cell phone, tap for details to reveal connection status, monitor battery status, and cell phone battery status. The green dots indicates your monitor is in good status. A red dot indicates there is something that needs your attention.  To record a symptom, click the circle on the monitor battery. In 30-60 seconds a list of symptoms will appear on the cell phone. Select your symptom and tap save. Your monitor will record a sustained or significant arrhythmia regardless of you clicking the button. Some patients do not feel the heart rhythm irregularities. [employer] will notify us of any serious or critical events.  Refer to instruction booklet for instructions on switching batteries, changing strips, the Do not disturb or  Pause features, or any additional questions.  Call [employer] at [phone], to confirm your monitor is transmitting and record your baseline. They will answer any questions you may have regarding the monitor instructions at that time.  Returning the monitor to [place] all equipment back into blue box. Peel off strip of paper to expose adhesive and close box securely. There is a prepaid UPS shipping label on this box. Drop in a UPS drop box, or at a UPS facility like [employer]. You may also contact [employer] to arrange UPS to pick up monitor package at your home.    Follow-Up: At [facility], you and your health needs are our priority.  As part of our continuing mission to provide you with exceptional heart care, we have created designated Provider Care Teams.  These Care Teams include your primary Cardiologist (physician) and Advanced Practice Providers (APPs -  Physician Assistants and Nurse Practitioners) who all work together to provide you with the care you need, when you need it.  We recommend signing up for the patient portal called "MyChart".  Sign up information is provided on this After Visit Summary.  MyChart is used to connect with patients for  Virtual Visits (Telemedicine).  Patients are able to view lab/test results, encounter notes, upcoming appointments, etc.  Non-urgent messages can be sent to your provider as well.   To learn more about what you can do with MyChart, go to [email or web address].    Your next appointment:   6 month(s)  The format for your next appointment:   In Person  Provider:   You may see Sanda Klein, MD or one of the following Advanced Practice Providers on your designated Care Team:    Almyra Deforest, PA-C  Fabian Sharp, [place] or   Roby Lofts, PA-C        Signed, Sanda Klein, MD  01/04/2020 4:27 PM    [address], [place], [place]  [id number] Phone: [phone]; Fax: [phone]

## 2020-01-05 ENCOUNTER — Ambulatory Visit (INDEPENDENT_AMBULATORY_CARE_PROVIDER_SITE_OTHER): Payer: Medicare HMO

## 2020-01-05 DIAGNOSIS — I480 Paroxysmal atrial fibrillation: Secondary | ICD-10-CM

## 2020-01-08 DIAGNOSIS — J449 Chronic obstructive pulmonary disease, unspecified: Secondary | ICD-10-CM | POA: Diagnosis not present

## 2020-01-09 DIAGNOSIS — I130 Hypertensive heart and chronic kidney disease with heart failure and stage 1 through stage 4 chronic kidney disease, or unspecified chronic kidney disease: Secondary | ICD-10-CM | POA: Diagnosis not present

## 2020-01-09 DIAGNOSIS — E1122 Type 2 diabetes mellitus with diabetic chronic kidney disease: Secondary | ICD-10-CM | POA: Diagnosis not present

## 2020-01-09 DIAGNOSIS — I4891 Unspecified atrial fibrillation: Secondary | ICD-10-CM | POA: Diagnosis not present

## 2020-01-09 DIAGNOSIS — I5042 Chronic combined systolic (congestive) and diastolic (congestive) heart failure: Secondary | ICD-10-CM | POA: Diagnosis not present

## 2020-01-13 DIAGNOSIS — M2550 Pain in unspecified joint: Secondary | ICD-10-CM | POA: Diagnosis not present

## 2020-01-13 DIAGNOSIS — Z79899 Other long term (current) drug therapy: Secondary | ICD-10-CM | POA: Diagnosis not present

## 2020-01-13 DIAGNOSIS — M5441 Lumbago with sciatica, right side: Secondary | ICD-10-CM | POA: Diagnosis not present

## 2020-01-13 DIAGNOSIS — M25551 Pain in right hip: Secondary | ICD-10-CM | POA: Diagnosis not present

## 2020-01-13 DIAGNOSIS — E669 Obesity, unspecified: Secondary | ICD-10-CM | POA: Diagnosis not present

## 2020-01-13 DIAGNOSIS — M0579 Rheumatoid arthritis with rheumatoid factor of multiple sites without organ or systems involvement: Secondary | ICD-10-CM | POA: Diagnosis not present

## 2020-01-13 DIAGNOSIS — Z6831 Body mass index (BMI) 31.0-31.9, adult: Secondary | ICD-10-CM | POA: Diagnosis not present

## 2020-01-15 DIAGNOSIS — M79675 Pain in left toe(s): Secondary | ICD-10-CM | POA: Diagnosis not present

## 2020-01-15 DIAGNOSIS — L03031 Cellulitis of right toe: Secondary | ICD-10-CM | POA: Diagnosis not present

## 2020-01-15 DIAGNOSIS — M79674 Pain in right toe(s): Secondary | ICD-10-CM | POA: Diagnosis not present

## 2020-01-15 DIAGNOSIS — B351 Tinea unguium: Secondary | ICD-10-CM | POA: Diagnosis not present

## 2020-01-20 DIAGNOSIS — E113313 Type 2 diabetes mellitus with moderate nonproliferative diabetic retinopathy with macular edema, bilateral: Secondary | ICD-10-CM | POA: Diagnosis not present

## 2020-01-20 DIAGNOSIS — H2513 Age-related nuclear cataract, bilateral: Secondary | ICD-10-CM | POA: Diagnosis not present

## 2020-01-26 ENCOUNTER — Telehealth: Payer: Self-pay | Admitting: Cardiovascular Disease

## 2020-01-26 NOTE — Telephone Encounter (Signed)
The patient called in asking if he could end the 30 day monitor 3 days early since he is going out of town. He has been advised that this was fine and he can go ahead and mail it back.

## 2020-01-26 NOTE — Telephone Encounter (Signed)
*  STAT* If patient is at the pharmacy, call can be transferred to refill team.   1. Which medications need to be refilled? (please list name of each medication and dose if known) losartan (COZAAR) 25 MG tablet  2. Which pharmacy/location (including street and city if local pharmacy) is medication to be sent to? [place]  3. Do they need a 30 day or 90 day supply? 14 day   Caren Griffins with [facility] states that he patient has ran out of this medication and will not have any until his order from [place] comes in. She is requesting for him to get a 14 day supply sent to [facility] until his shipment comes in.

## 2020-01-26 NOTE — Telephone Encounter (Signed)
Patient states that he needs to speak with Dr. Victorino December nurse. He states that the message is so long he needs to speak with her directly.

## 2020-01-28 DIAGNOSIS — E113313 Type 2 diabetes mellitus with moderate nonproliferative diabetic retinopathy with macular edema, bilateral: Secondary | ICD-10-CM | POA: Diagnosis not present

## 2020-01-29 ENCOUNTER — Other Ambulatory Visit: Payer: Self-pay | Admitting: Cardiovascular Disease

## 2020-02-03 ENCOUNTER — Other Ambulatory Visit: Payer: Self-pay

## 2020-02-03 ENCOUNTER — Telehealth: Payer: Self-pay | Admitting: Cardiovascular Disease

## 2020-02-03 DIAGNOSIS — I4891 Unspecified atrial fibrillation: Secondary | ICD-10-CM | POA: Diagnosis not present

## 2020-02-03 DIAGNOSIS — J449 Chronic obstructive pulmonary disease, unspecified: Secondary | ICD-10-CM | POA: Diagnosis not present

## 2020-02-03 DIAGNOSIS — Z23 Encounter for immunization: Secondary | ICD-10-CM | POA: Diagnosis not present

## 2020-02-03 DIAGNOSIS — E1140 Type 2 diabetes mellitus with diabetic neuropathy, unspecified: Secondary | ICD-10-CM | POA: Diagnosis not present

## 2020-02-03 DIAGNOSIS — Z6831 Body mass index (BMI) 31.0-31.9, adult: Secondary | ICD-10-CM | POA: Diagnosis not present

## 2020-02-03 MED ORDER — POTASSIUM CHLORIDE CRYS ER 20 MEQ PO TBCR: 20.0000 meq | EXTENDED_RELEASE_TABLET | Freq: Two times a day (BID) | ORAL | 1 refills | Status: AC

## 2020-02-03 NOTE — Telephone Encounter (Signed)
Pt c/o medication issue:  1. Name of Medication: isosorbide 120 mg   2. How are you currently taking this medication (dosage and times per day)? Once a day  3. Are you having a reaction (difficulty breathing--STAT)? no  4. What is your medication issue? Patient states he needs a refill on the medication, but it is not on his current list of medications. He states it should be, because he has been taking it for years. He states it needs to be sent to [facility].

## 2020-02-03 NOTE — Telephone Encounter (Signed)
LMTCB

## 2020-02-04 NOTE — Telephone Encounter (Signed)
Returned the call to the patient. He was calling for a refill on the Isosorbide 120 mg once daily. This had originally been decreased at his appointment with Dr. Sallyanne Kuster in July of 2020 to 60 mg once daily. At his hospital discharge on 05/22/2019, they discontinued it. He stated that he has been taking it the whole time. He had a follow up with Dr. Sallyanne Kuster in April on 2021 and the patient stated that he forgot to mention that he was still taking 120 mg once daily. He kept taking it due to chest pains.   He stated that his blood pressures have been fine but did not have any specific readings. He has been advised that Dr. Sallyanne Kuster is out of the office until next week.

## 2020-02-05 NOTE — Telephone Encounter (Signed)
Ok to restart Imdur 120 mg daily - since that is what he is taking.  Dr H (for Dr. Loletha Grayer)

## 2020-02-06 NOTE — Telephone Encounter (Signed)
Returned the call to the patient. He was inquiring if he should take the 120 mg or reduce it to 60 mg once daily.  He said he has enough for 2 weeks so will wait for Dr. Sallyanne Kuster to return for an answer.

## 2020-02-07 DIAGNOSIS — J449 Chronic obstructive pulmonary disease, unspecified: Secondary | ICD-10-CM | POA: Diagnosis not present

## 2020-02-09 DIAGNOSIS — I5042 Chronic combined systolic (congestive) and diastolic (congestive) heart failure: Secondary | ICD-10-CM | POA: Diagnosis not present

## 2020-02-09 DIAGNOSIS — I130 Hypertensive heart and chronic kidney disease with heart failure and stage 1 through stage 4 chronic kidney disease, or unspecified chronic kidney disease: Secondary | ICD-10-CM | POA: Diagnosis not present

## 2020-02-09 DIAGNOSIS — E1122 Type 2 diabetes mellitus with diabetic chronic kidney disease: Secondary | ICD-10-CM | POA: Diagnosis not present

## 2020-02-09 DIAGNOSIS — I4891 Unspecified atrial fibrillation: Secondary | ICD-10-CM | POA: Diagnosis not present

## 2020-02-12 ENCOUNTER — Other Ambulatory Visit: Payer: Self-pay | Admitting: *Deleted

## 2020-02-12 MED ORDER — ISOSORBIDE MONONITRATE ER 60 MG PO TB24: 60.0000 mg | ORAL_TABLET | Freq: Every day | ORAL | 3 refills | Status: AC

## 2020-02-12 MED ORDER — ASPIRIN EC 81 MG PO TBEC: 81.0000 mg | DELAYED_RELEASE_TABLET | Freq: Every day | ORAL | 3 refills | Status: AC

## 2020-02-12 NOTE — Telephone Encounter (Signed)
Spoke to patient, aware of recommendations.  rx sent to [facility] per patient request.

## 2020-02-12 NOTE — Telephone Encounter (Signed)
Please continue Imdur 60 mg daily. Send in 77-month, 3RF, Rx if needed.

## 2020-02-16 ENCOUNTER — Telehealth: Payer: Self-pay | Admitting: *Deleted

## 2020-02-16 NOTE — Telephone Encounter (Signed)
I will sign prescription. Thanks.

## 2020-02-16 NOTE — Telephone Encounter (Signed)
Patient says he needs an Rx written for [employer] for an  Custom shoe to deal with leg discrepancy.  Apparently they did not receive the referral submitted last December.

## 2020-02-20 ENCOUNTER — Other Ambulatory Visit: Payer: Self-pay

## 2020-02-20 NOTE — Patient Outreach (Signed)
[facility]) Care Management  [place]  02/20/2020   Robert [name] Oct 30, 1950 [id number]  Subjective: Telephone call to patient for bi-monthly call.  Patient reports he is doing pretty good. He states that his COPD is good.  Discussed COPD and daily maintenance.  Patient reports seeing retina specialist about 2 weeks ago for retina swelling from diabetes. He states that his diabetes A1c was 6.2 last check and he is doing good with his diet.  He shares that he went out of town for a family reunion last month and did well except for having some extra fluid from not taking his fluid pill while traveling.  He states Dr. Gerarda Fraction had him take extra fluid pills for a few days and he did well after that.  Patient voices no concerns.   Objective:   Encounter Medications:  Outpatient Encounter Medications as of 02/20/2020  Medication Sig  . acarbose (PRECOSE) 100 MG tablet Take 1 tablet (100 mg total) by mouth 3 (three) times daily with meals.  [name] acetaminophen (TYLENOL) 650 MG CR tablet Take 650 mg by mouth every 8 (eight) hours as needed for pain.  [name] ALPRAZolam (XANAX) 0.5 MG tablet Take 1 tablet (0.5 mg total) by mouth 3 (three) times daily as needed for anxiety or sleep.  [name] aspirin EC 81 MG tablet Take 1 tablet (81 mg total) by mouth daily.  [name] atorvastatin (LIPITOR) 80 MG tablet Take 1 tablet (80 mg total) by mouth daily at 6 PM.  . clopidogrel (PLAVIX) 75 MG tablet Take 1 tablet (75 mg total) by mouth daily.  . collagenase (SANTYL) ointment Apply topically daily. And cover with damp to dry dressing daily. Change more frequently if soiled  . fish oil-omega-3 fatty acids 1000 MG capsule Take 1 g by mouth daily.   . fluticasone (FLONASE) 50 MCG/ACT nasal spray   . folic acid (FOLVITE) 1 MG tablet Take 1 tablet (1 mg total) by mouth daily.  . furosemide (LASIX) 40 MG tablet Take 2 tablets (80 mg total) by mouth daily.  [name] gabapentin (NEURONTIN) 300 MG capsule Take 300 mg by  mouth 3 (three) times daily.  [name] glimepiride (AMARYL) 2 MG tablet Take 1 tablet (2 mg total) by mouth daily with breakfast.  . Insulin Glargine (TOUJEO SOLOSTAR [place]) Inject into the skin. 25 units  . isosorbide mononitrate (IMDUR) 60 MG 24 hr tablet Take 1 tablet (60 mg total) by mouth daily.  [name] losartan (COZAAR) 25 MG tablet Take 0.5 tablets (12.5 mg total) by mouth daily.  . methotrexate 50 MG/2ML injection   . metoprolol tartrate (LOPRESSOR) 25 MG tablet Take 1 tablet (25 mg total) by mouth 2 (two) times daily.  . mometasone-formoterol (DULERA) 200-5 MCG/ACT AERO Inhale 2 puffs into the lungs 2 (two) times daily.  . nitroGLYCERIN (NITROSTAT) 0.4 MG SL tablet Place 1 tablet (0.4 mg total) under the tongue every 5 (five) minutes x 3 doses as needed for chest pain.  . potassium chloride SA (KLOR-CON) 20 MEQ tablet Take 1 tablet (20 mEq total) by mouth 2 (two) times daily.  . primidone (MYSOLINE) 50 MG tablet Take 100 mg by mouth at bedtime.  . terbinafine (LAMISIL) 250 MG tablet Take 250 mg by mouth daily.  . vitamin C (VITAMIN C) 250 MG tablet Take 1 tablet (250 mg total) by mouth 2 (two) times daily.   No facility-administered encounter medications on file as of 02/20/2020.    Functional Status:  In your present state  of health, do you have any difficulty performing the following activities: 07/08/2019 07/04/2019  Hearing? N -  Vision? N -  Difficulty concentrating or making decisions? N -  Walking or climbing stairs? N -  Dressing or bathing? N -  Comment - -  Doing errands, shopping? N N  Preparing Food and eating ? N -  Using the Toilet? N -  In the past six months, have you accidently leaked urine? N -  Do you have problems with loss of bowel control? N -  Managing your Medications? N -  Managing your Finances? N -  Housekeeping or managing your Housekeeping? N -  Some recent data might be hidden    Fall/Depression Screening: Fall Risk  02/20/2020 12/25/2019 12/24/2019  Falls in  the past year? 1 0 1  Comment - - Jan 2021  Number falls in past yr: 0 - 0  Injury with Fall? 0 - 0  Risk for fall due to : - - History of fall(s)  Follow up - - Falls prevention discussed   PHQ 2/9 Scores 02/20/2020 07/08/2019 06/20/2019  PHQ - 2 Score 0 0 0    Assessment: Patient continues to manage chronic conditions and benefits from disease management support.    Plan:  [facility] CM Care Plan Problem One     Most Recent Value  Care Plan Problem One Knowledge deficit related to disease management of chronic condition state of COPD   Role Documenting the Problem One Care Management Telephonic Coordinator  Care Plan for Problem One Active  [facility] Long Term Goal  Over the next 90 days paitent will demonstrate and or verbalize understanding of self health management for long term care of COPD   [facility] Long Term Goal Start Date 02/20/20  Interventions for Problem One Long Term Goal Patient in green zone.  Discussed COPD and continues management.       RN CM will contact patient in the month of September and patient agreeable.    Jone Baseman, RN, MSN [facility] Care Management Coordinator Direct Line [phone] Cell [phone] Toll Free: [phone]  Fax: [phone]

## 2020-03-09 DIAGNOSIS — J449 Chronic obstructive pulmonary disease, unspecified: Secondary | ICD-10-CM | POA: Diagnosis not present

## 2020-03-10 DIAGNOSIS — I4891 Unspecified atrial fibrillation: Secondary | ICD-10-CM | POA: Diagnosis not present

## 2020-03-10 DIAGNOSIS — I5042 Chronic combined systolic (congestive) and diastolic (congestive) heart failure: Secondary | ICD-10-CM | POA: Diagnosis not present

## 2020-03-10 DIAGNOSIS — I130 Hypertensive heart and chronic kidney disease with heart failure and stage 1 through stage 4 chronic kidney disease, or unspecified chronic kidney disease: Secondary | ICD-10-CM | POA: Diagnosis not present

## 2020-03-10 DIAGNOSIS — E1122 Type 2 diabetes mellitus with diabetic chronic kidney disease: Secondary | ICD-10-CM | POA: Diagnosis not present

## 2020-03-23 DIAGNOSIS — H2513 Age-related nuclear cataract, bilateral: Secondary | ICD-10-CM | POA: Diagnosis not present

## 2020-03-23 DIAGNOSIS — E113513 Type 2 diabetes mellitus with proliferative diabetic retinopathy with macular edema, bilateral: Secondary | ICD-10-CM | POA: Diagnosis not present

## 2020-04-08 DIAGNOSIS — J449 Chronic obstructive pulmonary disease, unspecified: Secondary | ICD-10-CM | POA: Diagnosis not present

## 2020-04-09 DIAGNOSIS — I130 Hypertensive heart and chronic kidney disease with heart failure and stage 1 through stage 4 chronic kidney disease, or unspecified chronic kidney disease: Secondary | ICD-10-CM | POA: Diagnosis not present

## 2020-04-09 DIAGNOSIS — E1122 Type 2 diabetes mellitus with diabetic chronic kidney disease: Secondary | ICD-10-CM | POA: Diagnosis not present

## 2020-04-09 DIAGNOSIS — I480 Paroxysmal atrial fibrillation: Secondary | ICD-10-CM | POA: Diagnosis not present

## 2020-04-09 DIAGNOSIS — I5042 Chronic combined systolic (congestive) and diastolic (congestive) heart failure: Secondary | ICD-10-CM | POA: Diagnosis not present

## 2020-04-12 ENCOUNTER — Other Ambulatory Visit: Payer: Self-pay

## 2020-04-12 MED ORDER — FUROSEMIDE 40 MG PO TABS: 80.0000 mg | ORAL_TABLET | Freq: Every day | ORAL | 1 refills | Status: AC

## 2020-04-12 NOTE — Telephone Encounter (Signed)
Rx(s) sent to pharmacy electronically.  

## 2020-04-27 DIAGNOSIS — E669 Obesity, unspecified: Secondary | ICD-10-CM | POA: Diagnosis not present

## 2020-04-27 DIAGNOSIS — M0579 Rheumatoid arthritis with rheumatoid factor of multiple sites without organ or systems involvement: Secondary | ICD-10-CM | POA: Diagnosis not present

## 2020-04-27 DIAGNOSIS — M25551 Pain in right hip: Secondary | ICD-10-CM | POA: Diagnosis not present

## 2020-04-27 DIAGNOSIS — M5441 Lumbago with sciatica, right side: Secondary | ICD-10-CM | POA: Diagnosis not present

## 2020-04-27 DIAGNOSIS — M2550 Pain in unspecified joint: Secondary | ICD-10-CM | POA: Diagnosis not present

## 2020-04-27 DIAGNOSIS — Z79899 Other long term (current) drug therapy: Secondary | ICD-10-CM | POA: Diagnosis not present

## 2020-04-27 DIAGNOSIS — Z6831 Body mass index (BMI) 31.0-31.9, adult: Secondary | ICD-10-CM | POA: Diagnosis not present

## 2020-05-09 DIAGNOSIS — J449 Chronic obstructive pulmonary disease, unspecified: Secondary | ICD-10-CM | POA: Diagnosis not present

## 2020-05-11 DIAGNOSIS — E1122 Type 2 diabetes mellitus with diabetic chronic kidney disease: Secondary | ICD-10-CM | POA: Diagnosis not present

## 2020-05-11 DIAGNOSIS — I130 Hypertensive heart and chronic kidney disease with heart failure and stage 1 through stage 4 chronic kidney disease, or unspecified chronic kidney disease: Secondary | ICD-10-CM | POA: Diagnosis not present

## 2020-05-11 DIAGNOSIS — I5042 Chronic combined systolic (congestive) and diastolic (congestive) heart failure: Secondary | ICD-10-CM | POA: Diagnosis not present

## 2020-05-11 DIAGNOSIS — I480 Paroxysmal atrial fibrillation: Secondary | ICD-10-CM | POA: Diagnosis not present

## 2020-05-16 ENCOUNTER — Emergency Department (HOSPITAL_COMMUNITY)
Admission: EM | Admit: 2020-05-16 | Discharge: 2020-05-16 | Disposition: A | Discharge status: Home / Self Care | Payer: Medicare HMO | Source: Home / Self Care | Attending: Emergency Medicine | Admitting: Emergency Medicine

## 2020-05-16 ENCOUNTER — Encounter (HOSPITAL_COMMUNITY): Payer: Self-pay

## 2020-05-16 ENCOUNTER — Emergency Department (HOSPITAL_COMMUNITY): Payer: Medicare HMO

## 2020-05-16 DIAGNOSIS — J431 Panlobular emphysema: Secondary | ICD-10-CM | POA: Diagnosis not present

## 2020-05-16 DIAGNOSIS — I314 Cardiac tamponade: Secondary | ICD-10-CM | POA: Diagnosis present

## 2020-05-16 DIAGNOSIS — N179 Acute kidney failure, unspecified: Secondary | ICD-10-CM | POA: Diagnosis not present

## 2020-05-16 DIAGNOSIS — E1122 Type 2 diabetes mellitus with diabetic chronic kidney disease: Secondary | ICD-10-CM | POA: Diagnosis present

## 2020-05-16 DIAGNOSIS — Z681 Body mass index (BMI) 19 or less, adult: Secondary | ICD-10-CM | POA: Diagnosis not present

## 2020-05-16 DIAGNOSIS — E1151 Type 2 diabetes mellitus with diabetic peripheral angiopathy without gangrene: Secondary | ICD-10-CM | POA: Diagnosis present

## 2020-05-16 DIAGNOSIS — E1165 Type 2 diabetes mellitus with hyperglycemia: Secondary | ICD-10-CM | POA: Diagnosis present

## 2020-05-16 DIAGNOSIS — R209 Unspecified disturbances of skin sensation: Secondary | ICD-10-CM | POA: Diagnosis not present

## 2020-05-16 DIAGNOSIS — R918 Other nonspecific abnormal finding of lung field: Secondary | ICD-10-CM | POA: Diagnosis not present

## 2020-05-16 DIAGNOSIS — Z794 Long term (current) use of insulin: Secondary | ICD-10-CM | POA: Diagnosis not present

## 2020-05-16 DIAGNOSIS — I739 Peripheral vascular disease, unspecified: Secondary | ICD-10-CM | POA: Diagnosis not present

## 2020-05-16 DIAGNOSIS — N170 Acute kidney failure with tubular necrosis: Secondary | ICD-10-CM | POA: Diagnosis not present

## 2020-05-16 DIAGNOSIS — I517 Cardiomegaly: Secondary | ICD-10-CM | POA: Diagnosis not present

## 2020-05-16 DIAGNOSIS — N1832 Chronic kidney disease, stage 3b: Secondary | ICD-10-CM | POA: Diagnosis present

## 2020-05-16 DIAGNOSIS — I110 Hypertensive heart disease with heart failure: Secondary | ICD-10-CM | POA: Diagnosis not present

## 2020-05-16 DIAGNOSIS — Z7189 Other specified counseling: Secondary | ICD-10-CM | POA: Diagnosis not present

## 2020-05-16 DIAGNOSIS — J9690 Respiratory failure, unspecified, unspecified whether with hypoxia or hypercapnia: Secondary | ICD-10-CM | POA: Diagnosis not present

## 2020-05-16 DIAGNOSIS — E1140 Type 2 diabetes mellitus with diabetic neuropathy, unspecified: Secondary | ICD-10-CM | POA: Diagnosis not present

## 2020-05-16 DIAGNOSIS — I8312 Varicose veins of left lower extremity with inflammation: Secondary | ICD-10-CM | POA: Diagnosis not present

## 2020-05-16 DIAGNOSIS — R0689 Other abnormalities of breathing: Secondary | ICD-10-CM | POA: Diagnosis not present

## 2020-05-16 DIAGNOSIS — I5031 Acute diastolic (congestive) heart failure: Secondary | ICD-10-CM | POA: Diagnosis not present

## 2020-05-16 DIAGNOSIS — R000 Tachycardia, unspecified: Secondary | ICD-10-CM | POA: Diagnosis not present

## 2020-05-16 DIAGNOSIS — E8770 Fluid overload, unspecified: Secondary | ICD-10-CM | POA: Diagnosis not present

## 2020-05-16 DIAGNOSIS — I4892 Unspecified atrial flutter: Secondary | ICD-10-CM | POA: Diagnosis present

## 2020-05-16 DIAGNOSIS — E1142 Type 2 diabetes mellitus with diabetic polyneuropathy: Secondary | ICD-10-CM | POA: Diagnosis present

## 2020-05-16 DIAGNOSIS — J9621 Acute and chronic respiratory failure with hypoxia: Secondary | ICD-10-CM | POA: Diagnosis present

## 2020-05-16 DIAGNOSIS — I25119 Atherosclerotic heart disease of native coronary artery with unspecified angina pectoris: Secondary | ICD-10-CM | POA: Diagnosis not present

## 2020-05-16 DIAGNOSIS — I4891 Unspecified atrial fibrillation: Secondary | ICD-10-CM | POA: Diagnosis not present

## 2020-05-16 DIAGNOSIS — E871 Hypo-osmolality and hyponatremia: Secondary | ICD-10-CM | POA: Diagnosis not present

## 2020-05-16 DIAGNOSIS — U071 COVID-19: Secondary | ICD-10-CM | POA: Insufficient documentation

## 2020-05-16 DIAGNOSIS — I5041 Acute combined systolic (congestive) and diastolic (congestive) heart failure: Secondary | ICD-10-CM | POA: Diagnosis not present

## 2020-05-16 DIAGNOSIS — J439 Emphysema, unspecified: Secondary | ICD-10-CM | POA: Diagnosis not present

## 2020-05-16 DIAGNOSIS — Z515 Encounter for palliative care: Secondary | ICD-10-CM | POA: Diagnosis not present

## 2020-05-16 DIAGNOSIS — R0902 Hypoxemia: Secondary | ICD-10-CM | POA: Diagnosis not present

## 2020-05-16 DIAGNOSIS — E785 Hyperlipidemia, unspecified: Secondary | ICD-10-CM | POA: Diagnosis present

## 2020-05-16 DIAGNOSIS — R0602 Shortness of breath: Secondary | ICD-10-CM | POA: Diagnosis not present

## 2020-05-16 DIAGNOSIS — Z79899 Other long term (current) drug therapy: Secondary | ICD-10-CM | POA: Insufficient documentation

## 2020-05-16 DIAGNOSIS — I959 Hypotension, unspecified: Secondary | ICD-10-CM | POA: Diagnosis not present

## 2020-05-16 DIAGNOSIS — I313 Pericardial effusion (noninflammatory): Secondary | ICD-10-CM | POA: Diagnosis not present

## 2020-05-16 DIAGNOSIS — J9601 Acute respiratory failure with hypoxia: Secondary | ICD-10-CM | POA: Diagnosis not present

## 2020-05-16 DIAGNOSIS — R600 Localized edema: Secondary | ICD-10-CM | POA: Diagnosis not present

## 2020-05-16 DIAGNOSIS — R069 Unspecified abnormalities of breathing: Secondary | ICD-10-CM | POA: Diagnosis not present

## 2020-05-16 DIAGNOSIS — J90 Pleural effusion, not elsewhere classified: Secondary | ICD-10-CM | POA: Diagnosis not present

## 2020-05-16 DIAGNOSIS — N183 Chronic kidney disease, stage 3 unspecified: Secondary | ICD-10-CM | POA: Insufficient documentation

## 2020-05-16 DIAGNOSIS — Z8616 Personal history of COVID-19: Secondary | ICD-10-CM | POA: Diagnosis not present

## 2020-05-16 DIAGNOSIS — I5021 Acute systolic (congestive) heart failure: Secondary | ICD-10-CM | POA: Diagnosis not present

## 2020-05-16 DIAGNOSIS — Z7901 Long term (current) use of anticoagulants: Secondary | ICD-10-CM | POA: Insufficient documentation

## 2020-05-16 DIAGNOSIS — I5043 Acute on chronic combined systolic (congestive) and diastolic (congestive) heart failure: Secondary | ICD-10-CM | POA: Diagnosis not present

## 2020-05-16 DIAGNOSIS — I509 Heart failure, unspecified: Secondary | ICD-10-CM | POA: Diagnosis present

## 2020-05-16 DIAGNOSIS — I5042 Chronic combined systolic (congestive) and diastolic (congestive) heart failure: Secondary | ICD-10-CM | POA: Diagnosis not present

## 2020-05-16 DIAGNOSIS — Z7982 Long term (current) use of aspirin: Secondary | ICD-10-CM | POA: Insufficient documentation

## 2020-05-16 DIAGNOSIS — D696 Thrombocytopenia, unspecified: Secondary | ICD-10-CM | POA: Diagnosis present

## 2020-05-16 DIAGNOSIS — F17210 Nicotine dependence, cigarettes, uncomplicated: Secondary | ICD-10-CM | POA: Insufficient documentation

## 2020-05-16 DIAGNOSIS — I130 Hypertensive heart and chronic kidney disease with heart failure and stage 1 through stage 4 chronic kidney disease, or unspecified chronic kidney disease: Secondary | ICD-10-CM | POA: Diagnosis present

## 2020-05-16 DIAGNOSIS — I5023 Acute on chronic systolic (congestive) heart failure: Secondary | ICD-10-CM | POA: Diagnosis not present

## 2020-05-16 DIAGNOSIS — Z66 Do not resuscitate: Secondary | ICD-10-CM | POA: Diagnosis present

## 2020-05-16 DIAGNOSIS — N1831 Chronic kidney disease, stage 3a: Secondary | ICD-10-CM | POA: Diagnosis not present

## 2020-05-16 DIAGNOSIS — R609 Edema, unspecified: Secondary | ICD-10-CM | POA: Diagnosis not present

## 2020-05-16 DIAGNOSIS — I484 Atypical atrial flutter: Secondary | ICD-10-CM | POA: Diagnosis not present

## 2020-05-16 DIAGNOSIS — R188 Other ascites: Secondary | ICD-10-CM | POA: Diagnosis present

## 2020-05-16 DIAGNOSIS — I2510 Atherosclerotic heart disease of native coronary artery without angina pectoris: Secondary | ICD-10-CM | POA: Diagnosis not present

## 2020-05-16 DIAGNOSIS — J811 Chronic pulmonary edema: Secondary | ICD-10-CM | POA: Diagnosis not present

## 2020-05-16 DIAGNOSIS — I312 Hemopericardium, not elsewhere classified: Secondary | ICD-10-CM | POA: Diagnosis present

## 2020-05-16 DIAGNOSIS — E875 Hyperkalemia: Secondary | ICD-10-CM | POA: Diagnosis not present

## 2020-05-16 DIAGNOSIS — D631 Anemia in chronic kidney disease: Secondary | ICD-10-CM | POA: Diagnosis present

## 2020-05-16 DIAGNOSIS — J189 Pneumonia, unspecified organism: Secondary | ICD-10-CM | POA: Diagnosis not present

## 2020-05-16 DIAGNOSIS — I10 Essential (primary) hypertension: Secondary | ICD-10-CM | POA: Diagnosis not present

## 2020-05-16 DIAGNOSIS — J449 Chronic obstructive pulmonary disease, unspecified: Secondary | ICD-10-CM | POA: Insufficient documentation

## 2020-05-16 LAB — TROPONIN I (HIGH SENSITIVITY)
Troponin I (High Sensitivity): 10 ng/L (ref <18)
Troponin I (High Sensitivity): 10 ng/L (ref <18)

## 2020-05-16 LAB — COMPREHENSIVE METABOLIC PANEL
ALT: 57 U/L — ABNORMAL HIGH (ref 0–44)
AST: 30 U/L (ref 15–41)
Albumin: 3.2 g/dL — ABNORMAL LOW (ref 3.5–5.0)
Alkaline Phosphatase: 97 U/L (ref 38–126)
Anion gap: 7 (ref 5–15)
BUN: 49 mg/dL — ABNORMAL HIGH (ref 8–23)
CO2: 24 mmol/L (ref 22–32)
Calcium: 8.3 mg/dL — ABNORMAL LOW (ref 8.9–10.3)
Chloride: 104 mmol/L (ref 98–111)
Creatinine, Ser: 1.63 mg/dL — ABNORMAL HIGH (ref 0.61–1.24)
GFR calc Af Amer: 50 mL/min — ABNORMAL LOW (ref >60)
GFR calc non Af Amer: 43 mL/min — ABNORMAL LOW (ref >60)
Glucose, Bld: 185 mg/dL — ABNORMAL HIGH (ref 70–99)
Potassium: 4.4 mmol/L (ref 3.5–5.1)
Sodium: 135 mmol/L (ref 135–145)
Total Bilirubin: 0.7 mg/dL (ref 0.3–1.2)
Total Protein: 6.4 g/dL — ABNORMAL LOW (ref 6.5–8.1)

## 2020-05-16 LAB — CBC WITH DIFFERENTIAL/PLATELET
Abs Immature Granulocytes: 0.03 10*3/uL (ref 0.00–0.07)
Basophils Absolute: 0 10*3/uL (ref 0.0–0.1)
Basophils Relative: 1 %
Eosinophils Absolute: 0.1 10*3/uL (ref 0.0–0.5)
Eosinophils Relative: 2 %
HCT: 44.9 % (ref 39.0–52.0)
Hemoglobin: 14.4 g/dL (ref 13.0–17.0)
Immature Granulocytes: 0 %
Lymphocytes Relative: 7 %
Lymphs Abs: 0.6 10*3/uL — ABNORMAL LOW (ref 0.7–4.0)
MCH: 31.4 pg (ref 26.0–34.0)
MCHC: 32.1 g/dL (ref 30.0–36.0)
MCV: 97.8 fL (ref 80.0–100.0)
Monocytes Absolute: 0.7 10*3/uL (ref 0.1–1.0)
Monocytes Relative: 9 %
Neutro Abs: 6.7 10*3/uL (ref 1.7–7.7)
Neutrophils Relative %: 81 %
Platelets: 160 10*3/uL (ref 150–400)
RBC: 4.59 MIL/uL (ref 4.22–5.81)
RDW: 15 % (ref 11.5–15.5)
WBC: 8.2 10*3/uL (ref 4.0–10.5)
nRBC: 0 % (ref 0.0–0.2)

## 2020-05-16 LAB — SARS CORONAVIRUS 2 BY RT PCR (HOSPITAL ORDER, PERFORMED IN ~~LOC~~ HOSPITAL LAB): SARS Coronavirus 2: NEGATIVE

## 2020-05-16 LAB — BRAIN NATRIURETIC PEPTIDE: B Natriuretic Peptide: 149 pg/mL — ABNORMAL HIGH (ref 0.0–100.0)

## 2020-05-16 MED ORDER — FUROSEMIDE 10 MG/ML IJ SOLN
80.0000 mg | Freq: Once | INTRAMUSCULAR | Status: AC
  Administered 2020-05-16: 80 mg via INTRAVENOUS
  Filled 2020-05-16: qty 8

## 2020-05-16 NOTE — Discharge Instructions (Addendum)
Your shortness of breath is due to your congestive heart failure.  Please continue to take your lasix at home.  Weigh yourself daily.  Call and follow up closely with your doctor for further care.  Return if you have any concerns.  Use home oxygen as needed.

## 2020-05-16 NOTE — ED Provider Notes (Signed)
[facility] EMERGENCY DEPARTMENT Provider Note   CSN: [id number] Arrival date & time: 05/16/20  1141     History Chief Complaint  Patient presents with  . Shortness of Breath    Robert [name] is a 69 y.o. male.  The history is provided by the patient and medical records. No language interpreter was used.  Shortness of Breath    69 year old male with significant history of CHF, COPD, CAD, previously diagnosed with COVID-19, hypertension, prior MI brought here via EMS from home for evaluation of shortness of breath.  Patient report gradual onset of shortness of breath ongoing for the past 7 to 8 days.  He described shortness of breath as having increased trouble breathing worse when he lays down with exertion.  He does endorse occasional cough which is not unusual for him.  He does check his weight regularly and noticed that he gains approximately 4 pounds yesterday.  He has been compliant with his Lasix but noticed no improvement of his symptoms.  He does not complain of any associated fever or chills no runny nose sneezing sore throat loss of taste or smell.  He denies nausea vomiting or diarrhea and he also denies having active chest pain.  He feels his symptoms likely related to CHF exacerbation.  He does have home oxygen but rarely uses them.  He has had Covid vaccination as well as was diagnosed with Covid in December of last year.  He denies any recent sick contact.   Past Medical History:  Diagnosis Date  . Anxiety   . CHF (congestive heart failure) ([place])   . Collagen vascular disease ([place])   . COPD (chronic obstructive pulmonary disease) ([place])   . Coronary artery disease   . COVID-19   . Full dentures   . GERD (gastroesophageal reflux disease)    Rolaids as needed  . High cholesterol   . History of MI (myocardial infarction)   . Hypertension    med. dosage increased 04/2016; has been on BP med. x 10 yrs.  . Incarcerated umbilical hernia 86/5784  . Inguinal hernia 05/2016     bilateral   . Insulin dependent diabetes mellitus   . Ischemic cardiomyopathy    a. 03/2015 EF 40-45% by LV gram.  . Left leg cellulitis 10/08/2016  . Rheumatoid arthritis(714.0)     Patient Active Problem List   Diagnosis Date Noted  . Abnormality of gait 10/02/2019  . Leg length discrepancy 10/02/2019  . Primary osteoarthritis of right knee 08/28/2019  . Chronic pain of both knees 08/04/2019  . Acute on chronic combined systolic and diastolic CHF (congestive heart failure) ([place]) 07/05/2019  . Aspiration pneumonia ([place]) 07/04/2019  . Unspecified atrial fibrillation ([place]) 07/04/2019  . Anemia 07/04/2019  . Aspiration pneumonitis ([place]) 07/04/2019  . Acute respiratory failure with hypoxia ([place])   . CKD (chronic kidney disease), stage III   . Controlled type 2 diabetes mellitus with hyperglycemia, without long-term current use of insulin ([place])   . Urinary retention   . 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 Hypokalemia   . Physical debility 05/22/2019  . Demand ischemia ([place]) 05/20/2019  . Flash pulmonary edema ([place]) 05/20/2019  . Stage II decubitus ulcer ([place]) 05/18/2019  . Aspiration pneumonia of both lower lobes due to gastric secretions ([place]) 05/15/2019  . MRSA pneumonia ([place]) 05/15/2019  . Severe sepsis with septic shock ([place]) 05/15/2019  . NSTEMI (non-ST elevated myocardial infarction) ([place]) 05/15/2019  . Staphylococcal pneumonia ([place]) 04/20/2019  .  HCAP (healthcare-associated pneumonia) 04/20/2019  . Pneumonia due to COVID-19 virus 04/18/2019  . COPD (chronic obstructive pulmonary disease) with emphysema ([place]) 04/18/2019  . AKI (acute kidney injury) ([place]) 04/18/2019  . Diabetes mellitus type 2, uncontrolled, with complications ([place]) 66/44/0347  . Pressure injury of skin 04/18/2019  . Atrial fibrillation with RVR ([place]) 04/15/2019  . COVID-19 virus detected 04/12/2019  . Acute respiratory distress syndrome (ARDS) due to COVID-19 virus ([place]) 04/12/2019  . Hypoxia   .  Sepsis ([place])   . PAD (peripheral artery disease) ([place]) 03/27/2019  . Pulmonary edema 08/27/2018  . Acute respiratory failure with hypoxemia ([place]) 08/27/2018  . Chronic combined systolic and diastolic heart failure ([place]) 08/27/2018  . Dyslipidemia (high LDL; low HDL) 03/29/2018  . Dysphagia 01/02/2018  . Diarrhea 01/02/2018  . Family history of colon cancer 01/02/2018  . Ischemic cardiomyopathy   . Insulin dependent diabetes mellitus   . Hypertension   . History of MI (myocardial infarction)   . GERD (gastroesophageal reflux disease)   . Full dentures   . Coronary artery disease   . Anxiety   . Acute ST elevation myocardial infarction (STEMI) of inferior wall ([place]) 06/06/2017  . Varicose veins of left lower extremity with complications 42/59/5638  . Cellulitis of left leg without foot   . Right radial fracture 12/11/2016  . T3 vertebral fracture ([place]) 12/11/2016  . Fall 12/09/2016  . Thrombocytopenia ([place]) 10/09/2016  . Left leg cellulitis 10/08/2016  . Diabetes ([place]) 10/08/2016  . Inguinal hernia 05/12/2016  . Incarcerated umbilical hernia 75/64/3329  . Chronic combined systolic and diastolic CHF (congestive heart failure) ([place]) 04/25/2016  . High cholesterol   . Controlled type 2 diabetes mellitus with diabetic peripheral angiopathy without gangrene, with long-term current use of insulin ([place])   . Essential hypertension   . Non-STEMI (non-ST elevated myocardial infarction) ([place])   . CAD S/P percutaneous coronary angioplasty   . Unstable angina ([place]) 03/20/2015  . ICM-EF 35% at cath 02/01/15 02/02/2015  . DM type 2 causing vascular disease, not at goal [facility]) 05/29/2014  . Dyspnea 10/20/2013  . COPD (chronic obstructive pulmonary disease) ([place]) 09/03/2013  . Hyperlipidemia   . Old myocardial infarction 09/01/2013  . Abnormal nuclear stress test 05/17/2012  . S/P CFX PTCAI for ISR 02/01/15 05/17/2012  . PVD, chronic LLE 12/22/2011  . Rheumatoid arthritis ([place]) 12/22/2011  .  Contrast media allergy 12/22/2011  . Tobacco abuse 12/21/2011    Past Surgical History:  Procedure Laterality Date  . CARDIAC CATHETERIZATION N/A 02/01/2015   Procedure: Left Heart Cath and Coronary Angiography;  Surgeon: Belva Crome, MD;  Location: [place] [facility];  Service: Cardiovascular;  Laterality: N/A;  . CARDIAC CATHETERIZATION N/A 03/22/2015   Procedure: Left Heart Cath and Coronary Angiography;  Surgeon: Leonie Man, MD;  Location: [place] [facility];  Service: Cardiovascular;  Laterality: N/A;  . CARDIAC CATHETERIZATION  09/05/2002; 03/09/2003; 04/10/2005;06/02/2005; 05/09/2006; 02/09/2007; 03/01/2007  . COLONOSCOPY  2008   Dr. Gala Romney: diverticulosis, hyperplastic polyp.  . CORONARY ANGIOPLASTY  11/14/2012; 02/01/2015  . CORONARY ANGIOPLASTY WITH STENT PLACEMENT  05/16/2012   "1; makes total ~ 4"  . CORONARY STENT INTERVENTION N/A 06/06/2017   Procedure: CORONARY STENT INTERVENTION;  Surgeon: Belva Crome, MD;  Location: [place] [facility];  Service: Cardiovascular;  Laterality: N/A;  . INSERTION OF MESH Bilateral 05/24/2016   Procedure: INSERTION OF MESH;  Surgeon: Coralie Keens, MD;  Location: [place];  Service: General;  Laterality: Bilateral;  .  LAPAROSCOPIC CHOLECYSTECTOMY    . LAPAROSCOPIC INGUINAL HERNIA WITH UMBILICAL HERNIA Bilateral 05/24/2016   Procedure: BILATERAL LAPAROSCOPIC INGUINAL HERNIA REPAIR WITH MESH AND UMBILICAL HERNIA REPAIR WITH MESH;  Surgeon: Coralie Keens, MD;  Location: [place];  Service: General;  Laterality: Bilateral;  . LEFT HEART CATH AND CORONARY ANGIOGRAPHY N/A 06/06/2017   Procedure: LEFT HEART CATH AND CORONARY ANGIOGRAPHY;  Surgeon: Belva Crome, MD;  Location: [place] [facility];  Service: Cardiovascular;  Laterality: N/A;  . LEFT HEART CATHETERIZATION WITH CORONARY ANGIOGRAM N/A 12/22/2011   Procedure: LEFT HEART CATHETERIZATION WITH CORONARY ANGIOGRAM;  Surgeon: Troy Sine, MD;  Location: [facility] CATH  LAB;  Service: Cardiovascular;  Laterality: N/A;  . LEFT HEART CATHETERIZATION WITH CORONARY ANGIOGRAM Bilateral 05/16/2012   Procedure: LEFT HEART CATHETERIZATION WITH CORONARY ANGIOGRAM;  Surgeon: Lorretta Harp, MD;  Location: [facility] CATH LAB;  Service: Cardiovascular;  Laterality: Bilateral;  . LEFT HEART CATHETERIZATION WITH CORONARY ANGIOGRAM N/A 11/14/2012   Procedure: LEFT HEART CATHETERIZATION WITH CORONARY ANGIOGRAM;  Surgeon: Troy Sine, MD;  Location: [facility] CATH LAB;  Service: Cardiovascular;  Laterality: N/A;  . LEFT HEART CATHETERIZATION WITH CORONARY ANGIOGRAM N/A 09/01/2013   Procedure: LEFT HEART CATHETERIZATION WITH CORONARY ANGIOGRAM;  Surgeon: Leonie Man, MD;  Location: [facility] CATH LAB;  Service: Cardiovascular;  Laterality: N/A;  . [place]; 1973; 1985   x 3  . NM MYOCAR PERF WALL MOTION  08/30/2009   No significant ischemia  . OPEN REDUCTION INTERNAL FIXATION (ORIF) DISTAL RADIAL FRACTURE Right 12/10/2016   Procedure: OPEN REDUCTION INTERNAL FIXATION (ORIF) DISTAL RADIAL FRACTURE;  Surgeon: Iran Planas, MD;  Location: [place];  Service: Orthopedics;  Laterality: Right;  . PERCUTANEOUS CORONARY INTERVENTION-BALLOON ONLY  12/22/2011   Procedure: PERCUTANEOUS CORONARY INTERVENTION-BALLOON ONLY;  Surgeon: Troy Sine, MD;  Location: [facility] CATH LAB;  Service: Cardiovascular;;  . PERCUTANEOUS CORONARY INTERVENTION-BALLOON ONLY  11/14/2012   Procedure: PERCUTANEOUS CORONARY INTERVENTION-BALLOON ONLY;  Surgeon: Troy Sine, MD;  Location: [facility] CATH LAB;  Service: Cardiovascular;;  . PERCUTANEOUS CORONARY STENT INTERVENTION (PCI-S)  05/16/2012   Procedure: PERCUTANEOUS CORONARY STENT INTERVENTION (PCI-S);  Surgeon: Lorretta Harp, MD;  Location: [facility] CATH LAB;  Service: Cardiovascular;;  . PERCUTANEOUS CORONARY STENT INTERVENTION (PCI-S)  09/01/2013   Procedure: PERCUTANEOUS CORONARY STENT INTERVENTION (PCI-S);  Surgeon: Leonie Man, MD;  Location: [facility] CATH LAB;  Service:  Cardiovascular;;       Family History  Problem Relation Age of Onset  . Heart disease Father   . Colon cancer Brother        early 74s  . Hypertension Mother   . Heart disease Mother   . Colon cancer Mother        died age 5. late 47s early 21s at diagnosis.   . Lung cancer Mother     Social History   Tobacco Use  . Smoking status: Current Some Day Smoker    Packs/day: 1.00    Types: Cigarettes    Last attempt to quit: 06/11/2015    Years since quitting: 4.9  . Smokeless tobacco: Never Used  . Tobacco comment: pt states smoking less than half a pack  Vaping Use  . Vaping Use: Never used  Substance Use Topics  . Alcohol use: No  . Drug use: No    Home Medications Prior to Admission medications   Medication Sig Start Date End Date Taking? Authorizing Provider  acarbose (PRECOSE) 100 MG tablet Take 1 tablet (100  mg total) by mouth 3 (three) times daily with meals. 06/06/19   Love, Ivan Anchors, PA-C  acetaminophen (TYLENOL) 650 MG CR tablet Take 650 mg by mouth every 8 (eight) hours as needed for pain.    [provider]  ALPRAZolam Duanne Moron) 0.5 MG tablet Take 1 tablet (0.5 mg total) by mouth 3 (three) times daily as needed for anxiety or sleep. 06/06/19   Love, Ivan Anchors, PA-C  aspirin EC 81 MG tablet Take 1 tablet (81 mg total) by mouth daily. 02/12/20   Croitoru, Mihai, MD  atorvastatin (LIPITOR) 80 MG tablet Take 1 tablet (80 mg total) by mouth daily at 6 PM. 12/30/19   Croitoru, Mihai, MD  clopidogrel (PLAVIX) 75 MG tablet Take 1 tablet (75 mg total) by mouth daily. 10/07/19   Kroeger, Lorelee Cover., PA-C  collagenase (SANTYL) ointment Apply topically daily. And cover with damp to dry dressing daily. Change more frequently if soiled 06/06/19   Love, Ivan Anchors, PA-C  fish oil-omega-3 fatty acids 1000 MG capsule Take 1 g by mouth daily.     [provider]  fluticasone Asencion Islam) 50 MCG/ACT nasal spray  11/19/19   [provider]  folic acid (FOLVITE) 1 MG tablet  Take 1 tablet (1 mg total) by mouth daily. 06/06/19   Love, Ivan Anchors, PA-C  furosemide (LASIX) 40 MG tablet Take 2 tablets (80 mg total) by mouth daily. 04/12/20   Croitoru, Mihai, MD  gabapentin (NEURONTIN) 300 MG capsule Take 300 mg by mouth 3 (three) times daily.    [provider]  glimepiride (AMARYL) 2 MG tablet Take 1 tablet (2 mg total) by mouth daily with breakfast. 06/06/19   Love, Ivan Anchors, PA-C  Insulin Glargine (TOUJEO SOLOSTAR [place]) Inject into the skin. 25 units    [provider]  isosorbide mononitrate (IMDUR) 60 MG 24 hr tablet Take 1 tablet (60 mg total) by mouth daily. 02/12/20 02/06/21  Croitoru, Mihai, MD  losartan (COZAAR) 25 MG tablet Take 0.5 tablets (12.5 mg total) by mouth daily. 09/29/19   Kroeger, Lorelee Cover., PA-C  methotrexate 50 MG/2ML injection  10/15/19   [provider]  metoprolol tartrate (LOPRESSOR) 25 MG tablet Take 1 tablet (25 mg total) by mouth 2 (two) times daily. 09/29/19   Kroeger, Lorelee Cover., PA-C  mometasone-formoterol (DULERA) 200-5 MCG/ACT AERO Inhale 2 puffs into the lungs 2 (two) times daily. 06/06/19   Love, Ivan Anchors, PA-C  nitroGLYCERIN (NITROSTAT) 0.4 MG SL tablet Place 1 tablet (0.4 mg total) under the tongue every 5 (five) minutes x 3 doses as needed for chest pain. 04/24/16   Croitoru, Mihai, MD  potassium chloride SA (KLOR-CON) 20 MEQ tablet Take 1 tablet (20 mEq total) by mouth 2 (two) times daily. 02/03/20   Kroeger, Lorelee Cover., PA-C  primidone (MYSOLINE) 50 MG tablet Take 100 mg by mouth at bedtime. 09/18/19   [provider]  terbinafine (LAMISIL) 250 MG tablet Take 250 mg by mouth daily. 11/26/19   [provider]  vitamin C (VITAMIN C) 250 MG tablet Take 1 tablet (250 mg total) by mouth 2 (two) times daily. 06/06/19   Love, Ivan Anchors, PA-C    Allergies    Ivp dye [iodinated diagnostic agents]  Review of Systems   Review of Systems  Respiratory: Positive for shortness of breath.   All other systems reviewed and  are negative.   Physical Exam Updated Vital Signs BP 132/82 (BP Location: Left Arm)   Pulse 92  Temp 97.9 F (36.6 C) (Oral)   Resp (!) 23   Ht 6\' 3"  (1.905 m)   Wt 111.5 kg   SpO2 100% Comment: 4 l  BMI 30.72 kg/m   Physical Exam Vitals and nursing note reviewed.  Constitutional:      General: He is not in acute distress.    Appearance: He is well-developed.     Comments: Elderly male appears older than stated age, laying in bed in no significant discomfort.  HENT:     Head: Atraumatic.     Nose:     Comments: Nasal cannula in place. Eyes:     Conjunctiva/sclera: Conjunctivae normal.  Neck:     Comments: No JVD Cardiovascular:     Rate and Rhythm: Normal rate and regular rhythm.     Pulses: Normal pulses.     Heart sounds: Normal heart sounds.  Pulmonary:     Breath sounds: Rales present. No wheezing or rhonchi.  Abdominal:     General: There is distension.     Tenderness: There is no abdominal tenderness.  Musculoskeletal:        General: Swelling (1+ pitting edema to bilateral lower extremities.) present.     Cervical back: Neck supple.  Skin:    Findings: No rash.  Neurological:     Mental Status: He is alert and oriented to person, place, and time.  Psychiatric:        Mood and Affect: Mood normal.     ED Results / Procedures / Treatments   Labs (all labs ordered are listed, but only abnormal results are displayed) Labs Reviewed  CBC WITH DIFFERENTIAL/PLATELET - Abnormal; Notable for the following components:      Result Value   Lymphs Abs 0.6 (*)    All other components within normal limits  COMPREHENSIVE METABOLIC PANEL - Abnormal; Notable for the following components:   Glucose, Bld 185 (*)    BUN 49 (*)    Creatinine, Ser 1.63 (*)    Calcium 8.3 (*)    Total Protein 6.4 (*)    Albumin 3.2 (*)    ALT 57 (*)    GFR calc non Af Amer 43 (*)    GFR calc Af Amer 50 (*)    All other components within normal limits  BRAIN NATRIURETIC PEPTIDE -  Abnormal; Notable for the following components:   B Natriuretic Peptide 149.0 (*)    All other components within normal limits  SARS CORONAVIRUS 2 BY RT PCR (HOSPITAL ORDER, [place] [facility])  TROPONIN I (HIGH SENSITIVITY)  TROPONIN I (HIGH SENSITIVITY)    EKG None  ED ECG REPORT   Date: 05/16/2020  Rate: 92  Rhythm: normal sinus rhythm  QRS Axis: normal  Intervals: normal  ST/T Wave abnormalities: nonspecific ST changes  Conduction Disutrbances:none  Narrative Interpretation:   Old EKG Reviewed: unchanged  I have personally reviewed the EKG tracing and agree with the computerized printout as noted.    Radiology DG Chest Port 1 View  Result Date: 05/16/2020 CLINICAL DATA:  Shortness of breath. EXAM: PORTABLE CHEST 1 VIEW COMPARISON:  July 03, 2019. FINDINGS: Stable cardiomegaly with central pulmonary vascular congestion. No pneumothorax is noted. Stable small left pleural effusion or pleural thickening is noted. Increased perihilar and basilar interstitial densities are noted concerning for edema. Bony thorax is unremarkable. IMPRESSION: Stable cardiomegaly with central pulmonary vascular congestion. Increased bilateral perihilar and basilar interstitial densities are noted concerning for edema. Aortic Atherosclerosis (ICD10-I70.0). Electronically  Signed   By: Marijo Conception M.D.   On: 05/16/2020 12:34    Procedures Procedures (including critical care time)  Medications Ordered in ED Medications  furosemide (LASIX) injection 80 mg (80 mg Intravenous Given 05/16/20 1413)  furosemide (LASIX) injection 80 mg (80 mg Intravenous Given 05/16/20 1453)    ED Course  I have reviewed the triage vital signs and the nursing notes.  Pertinent labs & imaging results that were available during my care of the patient were reviewed by me and considered in my medical decision making (see chart for details).    MDM Rules/Calculators/A&P                          BP  121/81   Pulse 95   Temp 97.9 F (36.6 C) (Oral)   Resp (!) 27   Ht 6\' 3"  (1.905 m)   Wt 111.5 kg   SpO2 98%   BMI 30.72 kg/m   Final Clinical Impression(s) / ED Diagnoses Final diagnoses:  Acute on chronic congestive heart failure, unspecified heart failure type ([place])    Rx / DC Orders ED Discharge Orders    None     1:15 PM Patient with significant history of CHF here with progressive worsening shortness of breath and fluid gain suggestive of CHF exacerbation.  Unknown provoking factors however he denies any active chest pain.  Upon arrival, O2 sats was at 90% on 2 L.  He also endorsed decreased urinary output.  Will check labs, give Lasix, and will monitor closely.  Care discussed with DR. Zammit.  2:19 PM Patient given initial dose of Lasix at 80 mg via IV.  Dr. Roderic Palau recommended an additional 80 mg IV dose of Lasix.   4:23 PM Pt is sitting, eating food, feeling much more comfortable.  COVID-19 test is currently pending however pt has had COVID infection previously and has been fully vaccinated.  Pt does have supplemental O2 at home. I encourage pt to use it as needed and to follow up closely with his PCP for further care.  We have discussed option of admission vs home.  Pt prefers going home. Return precaution discussed.   ARRICK DUTTON was evaluated in Emergency Department on 05/16/2020 for the symptoms described in the history of present illness. He was evaluated in the context of the global COVID-19 pandemic, which necessitated consideration that the patient might be at risk for infection with the SARS-CoV-2 virus that causes COVID-19. Institutional protocols and algorithms that pertain to the evaluation of patients at risk for COVID-19 are in a state of rapid change based on information released by regulatory bodies including the CDC and federal and state organizations. These policies and algorithms were followed during the patient's care in the ED.    Domenic Moras,  PA-C 05/16/20 1629    Milton Ferguson, MD 05/18/20 [phone]

## 2020-05-16 NOTE — ED Triage Notes (Signed)
Pt with increasing SOB for one week. Increase swelling in abdomen and legs. Now having to sleep with oxygen. sats were 90 % on 2 liters . Taking 80 mg lasix daily. Decreased urinary output.

## 2020-05-18 DIAGNOSIS — I10 Essential (primary) hypertension: Secondary | ICD-10-CM | POA: Diagnosis not present

## 2020-05-18 DIAGNOSIS — I8312 Varicose veins of left lower extremity with inflammation: Secondary | ICD-10-CM | POA: Diagnosis not present

## 2020-05-18 DIAGNOSIS — Z681 Body mass index (BMI) 19 or less, adult: Secondary | ICD-10-CM | POA: Diagnosis not present

## 2020-05-18 DIAGNOSIS — R600 Localized edema: Secondary | ICD-10-CM | POA: Diagnosis not present

## 2020-05-18 DIAGNOSIS — E1140 Type 2 diabetes mellitus with diabetic neuropathy, unspecified: Secondary | ICD-10-CM | POA: Diagnosis not present

## 2020-05-19 ENCOUNTER — Emergency Department (HOSPITAL_COMMUNITY): Payer: Medicare HMO

## 2020-05-19 ENCOUNTER — Other Ambulatory Visit: Payer: Self-pay

## 2020-05-19 ENCOUNTER — Inpatient Hospital Stay (HOSPITAL_COMMUNITY)
Admission: EM | Admit: 2020-05-19 | Discharge: 2020-07-12 | DRG: 286 | Disposition: E | Payer: Medicare HMO | Attending: Internal Medicine | Admitting: Internal Medicine

## 2020-05-19 ENCOUNTER — Encounter (HOSPITAL_COMMUNITY): Payer: Self-pay

## 2020-05-19 DIAGNOSIS — J439 Emphysema, unspecified: Secondary | ICD-10-CM | POA: Diagnosis not present

## 2020-05-19 DIAGNOSIS — N170 Acute kidney failure with tubular necrosis: Secondary | ICD-10-CM | POA: Diagnosis not present

## 2020-05-19 DIAGNOSIS — R339 Retention of urine, unspecified: Secondary | ICD-10-CM | POA: Diagnosis not present

## 2020-05-19 DIAGNOSIS — E1142 Type 2 diabetes mellitus with diabetic polyneuropathy: Secondary | ICD-10-CM | POA: Diagnosis present

## 2020-05-19 DIAGNOSIS — I70202 Unspecified atherosclerosis of native arteries of extremities, left leg: Secondary | ICD-10-CM | POA: Diagnosis present

## 2020-05-19 DIAGNOSIS — Z91041 Radiographic dye allergy status: Secondary | ICD-10-CM

## 2020-05-19 DIAGNOSIS — I4892 Unspecified atrial flutter: Secondary | ICD-10-CM | POA: Diagnosis present

## 2020-05-19 DIAGNOSIS — M069 Rheumatoid arthritis, unspecified: Secondary | ICD-10-CM | POA: Diagnosis present

## 2020-05-19 DIAGNOSIS — F17210 Nicotine dependence, cigarettes, uncomplicated: Secondary | ICD-10-CM | POA: Diagnosis present

## 2020-05-19 DIAGNOSIS — R188 Other ascites: Secondary | ICD-10-CM | POA: Diagnosis present

## 2020-05-19 DIAGNOSIS — I314 Cardiac tamponade: Secondary | ICD-10-CM | POA: Diagnosis present

## 2020-05-19 DIAGNOSIS — R0689 Other abnormalities of breathing: Secondary | ICD-10-CM | POA: Diagnosis not present

## 2020-05-19 DIAGNOSIS — I5021 Acute systolic (congestive) heart failure: Secondary | ICD-10-CM | POA: Diagnosis not present

## 2020-05-19 DIAGNOSIS — E1122 Type 2 diabetes mellitus with diabetic chronic kidney disease: Secondary | ICD-10-CM | POA: Diagnosis present

## 2020-05-19 DIAGNOSIS — E875 Hyperkalemia: Secondary | ICD-10-CM | POA: Diagnosis not present

## 2020-05-19 DIAGNOSIS — Z66 Do not resuscitate: Secondary | ICD-10-CM

## 2020-05-19 DIAGNOSIS — E1165 Type 2 diabetes mellitus with hyperglycemia: Secondary | ICD-10-CM | POA: Diagnosis present

## 2020-05-19 DIAGNOSIS — Z79899 Other long term (current) drug therapy: Secondary | ICD-10-CM

## 2020-05-19 DIAGNOSIS — Z8701 Personal history of pneumonia (recurrent): Secondary | ICD-10-CM

## 2020-05-19 DIAGNOSIS — N179 Acute kidney failure, unspecified: Secondary | ICD-10-CM | POA: Diagnosis not present

## 2020-05-19 DIAGNOSIS — R918 Other nonspecific abnormal finding of lung field: Secondary | ICD-10-CM | POA: Diagnosis not present

## 2020-05-19 DIAGNOSIS — E1151 Type 2 diabetes mellitus with diabetic peripheral angiopathy without gangrene: Secondary | ICD-10-CM

## 2020-05-19 DIAGNOSIS — Z7902 Long term (current) use of antithrombotics/antiplatelets: Secondary | ICD-10-CM

## 2020-05-19 DIAGNOSIS — I252 Old myocardial infarction: Secondary | ICD-10-CM

## 2020-05-19 DIAGNOSIS — J9601 Acute respiratory failure with hypoxia: Secondary | ICD-10-CM | POA: Diagnosis present

## 2020-05-19 DIAGNOSIS — Z801 Family history of malignant neoplasm of trachea, bronchus and lung: Secondary | ICD-10-CM

## 2020-05-19 DIAGNOSIS — Z7189 Other specified counseling: Secondary | ICD-10-CM | POA: Diagnosis not present

## 2020-05-19 DIAGNOSIS — I313 Pericardial effusion (noninflammatory): Secondary | ICD-10-CM | POA: Diagnosis present

## 2020-05-19 DIAGNOSIS — N1832 Chronic kidney disease, stage 3b: Secondary | ICD-10-CM | POA: Diagnosis present

## 2020-05-19 DIAGNOSIS — R0902 Hypoxemia: Secondary | ICD-10-CM | POA: Diagnosis not present

## 2020-05-19 DIAGNOSIS — I272 Pulmonary hypertension, unspecified: Secondary | ICD-10-CM | POA: Diagnosis present

## 2020-05-19 DIAGNOSIS — I4891 Unspecified atrial fibrillation: Secondary | ICD-10-CM | POA: Diagnosis not present

## 2020-05-19 DIAGNOSIS — R069 Unspecified abnormalities of breathing: Secondary | ICD-10-CM | POA: Diagnosis not present

## 2020-05-19 DIAGNOSIS — D696 Thrombocytopenia, unspecified: Secondary | ICD-10-CM | POA: Diagnosis present

## 2020-05-19 DIAGNOSIS — J449 Chronic obstructive pulmonary disease, unspecified: Secondary | ICD-10-CM | POA: Diagnosis not present

## 2020-05-19 DIAGNOSIS — E785 Hyperlipidemia, unspecified: Secondary | ICD-10-CM | POA: Diagnosis present

## 2020-05-19 DIAGNOSIS — J9621 Acute and chronic respiratory failure with hypoxia: Secondary | ICD-10-CM | POA: Diagnosis present

## 2020-05-19 DIAGNOSIS — Z515 Encounter for palliative care: Secondary | ICD-10-CM

## 2020-05-19 DIAGNOSIS — I509 Heart failure, unspecified: Secondary | ICD-10-CM | POA: Diagnosis present

## 2020-05-19 DIAGNOSIS — E669 Obesity, unspecified: Secondary | ICD-10-CM | POA: Diagnosis present

## 2020-05-19 DIAGNOSIS — J811 Chronic pulmonary edema: Secondary | ICD-10-CM | POA: Diagnosis not present

## 2020-05-19 DIAGNOSIS — I3139 Other pericardial effusion (noninflammatory): Secondary | ICD-10-CM

## 2020-05-19 DIAGNOSIS — N1831 Chronic kidney disease, stage 3a: Secondary | ICD-10-CM | POA: Diagnosis not present

## 2020-05-19 DIAGNOSIS — Z7951 Long term (current) use of inhaled steroids: Secondary | ICD-10-CM

## 2020-05-19 DIAGNOSIS — I312 Hemopericardium, not elsewhere classified: Secondary | ICD-10-CM | POA: Diagnosis present

## 2020-05-19 DIAGNOSIS — E871 Hypo-osmolality and hyponatremia: Secondary | ICD-10-CM | POA: Diagnosis not present

## 2020-05-19 DIAGNOSIS — D631 Anemia in chronic kidney disease: Secondary | ICD-10-CM | POA: Diagnosis present

## 2020-05-19 DIAGNOSIS — R000 Tachycardia, unspecified: Secondary | ICD-10-CM | POA: Diagnosis not present

## 2020-05-19 DIAGNOSIS — I480 Paroxysmal atrial fibrillation: Secondary | ICD-10-CM | POA: Diagnosis present

## 2020-05-19 DIAGNOSIS — I25119 Atherosclerotic heart disease of native coronary artery with unspecified angina pectoris: Secondary | ICD-10-CM | POA: Diagnosis not present

## 2020-05-19 DIAGNOSIS — Z8249 Family history of ischemic heart disease and other diseases of the circulatory system: Secondary | ICD-10-CM

## 2020-05-19 DIAGNOSIS — I5041 Acute combined systolic (congestive) and diastolic (congestive) heart failure: Secondary | ICD-10-CM | POA: Diagnosis not present

## 2020-05-19 DIAGNOSIS — Z955 Presence of coronary angioplasty implant and graft: Secondary | ICD-10-CM

## 2020-05-19 DIAGNOSIS — Z452 Encounter for adjustment and management of vascular access device: Secondary | ICD-10-CM

## 2020-05-19 DIAGNOSIS — N183 Chronic kidney disease, stage 3 unspecified: Secondary | ICD-10-CM | POA: Diagnosis present

## 2020-05-19 DIAGNOSIS — I5043 Acute on chronic combined systolic (congestive) and diastolic (congestive) heart failure: Secondary | ICD-10-CM | POA: Diagnosis present

## 2020-05-19 DIAGNOSIS — L89896 Pressure-induced deep tissue damage of other site: Secondary | ICD-10-CM | POA: Diagnosis not present

## 2020-05-19 DIAGNOSIS — Z6832 Body mass index (BMI) 32.0-32.9, adult: Secondary | ICD-10-CM

## 2020-05-19 DIAGNOSIS — Z8616 Personal history of COVID-19: Secondary | ICD-10-CM

## 2020-05-19 DIAGNOSIS — Z800 Family history of malignant neoplasm of digestive organs: Secondary | ICD-10-CM

## 2020-05-19 DIAGNOSIS — I5023 Acute on chronic systolic (congestive) heart failure: Secondary | ICD-10-CM | POA: Diagnosis not present

## 2020-05-19 DIAGNOSIS — I110 Hypertensive heart disease with heart failure: Secondary | ICD-10-CM | POA: Diagnosis not present

## 2020-05-19 DIAGNOSIS — I959 Hypotension, unspecified: Secondary | ICD-10-CM | POA: Diagnosis not present

## 2020-05-19 DIAGNOSIS — I878 Other specified disorders of veins: Secondary | ICD-10-CM | POA: Diagnosis present

## 2020-05-19 DIAGNOSIS — I2510 Atherosclerotic heart disease of native coronary artery without angina pectoris: Secondary | ICD-10-CM | POA: Diagnosis present

## 2020-05-19 DIAGNOSIS — R609 Edema, unspecified: Secondary | ICD-10-CM | POA: Diagnosis not present

## 2020-05-19 DIAGNOSIS — R34 Anuria and oliguria: Secondary | ICD-10-CM | POA: Diagnosis not present

## 2020-05-19 DIAGNOSIS — I517 Cardiomegaly: Secondary | ICD-10-CM | POA: Diagnosis not present

## 2020-05-19 DIAGNOSIS — I130 Hypertensive heart and chronic kidney disease with heart failure and stage 1 through stage 4 chronic kidney disease, or unspecified chronic kidney disease: Principal | ICD-10-CM | POA: Diagnosis present

## 2020-05-19 DIAGNOSIS — J90 Pleural effusion, not elsewhere classified: Secondary | ICD-10-CM | POA: Diagnosis not present

## 2020-05-19 DIAGNOSIS — J431 Panlobular emphysema: Secondary | ICD-10-CM | POA: Diagnosis not present

## 2020-05-19 DIAGNOSIS — Z794 Long term (current) use of insulin: Secondary | ICD-10-CM

## 2020-05-19 DIAGNOSIS — I255 Ischemic cardiomyopathy: Secondary | ICD-10-CM | POA: Diagnosis present

## 2020-05-19 DIAGNOSIS — I739 Peripheral vascular disease, unspecified: Secondary | ICD-10-CM | POA: Diagnosis not present

## 2020-05-19 DIAGNOSIS — I484 Atypical atrial flutter: Secondary | ICD-10-CM | POA: Diagnosis not present

## 2020-05-19 DIAGNOSIS — E8770 Fluid overload, unspecified: Secondary | ICD-10-CM | POA: Diagnosis not present

## 2020-05-19 DIAGNOSIS — I10 Essential (primary) hypertension: Secondary | ICD-10-CM | POA: Diagnosis not present

## 2020-05-19 DIAGNOSIS — I5031 Acute diastolic (congestive) heart failure: Secondary | ICD-10-CM

## 2020-05-19 DIAGNOSIS — J9690 Respiratory failure, unspecified, unspecified whether with hypoxia or hypercapnia: Secondary | ICD-10-CM | POA: Diagnosis not present

## 2020-05-19 DIAGNOSIS — R0602 Shortness of breath: Secondary | ICD-10-CM | POA: Diagnosis not present

## 2020-05-19 DIAGNOSIS — I5082 Biventricular heart failure: Secondary | ICD-10-CM | POA: Diagnosis present

## 2020-05-19 DIAGNOSIS — Z7984 Long term (current) use of oral hypoglycemic drugs: Secondary | ICD-10-CM

## 2020-05-19 DIAGNOSIS — R748 Abnormal levels of other serum enzymes: Secondary | ICD-10-CM | POA: Diagnosis not present

## 2020-05-19 DIAGNOSIS — Z7982 Long term (current) use of aspirin: Secondary | ICD-10-CM

## 2020-05-19 DIAGNOSIS — R209 Unspecified disturbances of skin sensation: Secondary | ICD-10-CM | POA: Diagnosis not present

## 2020-05-19 HISTORY — DX: Paroxysmal atrial fibrillation: I48.0

## 2020-05-19 LAB — BASIC METABOLIC PANEL
Anion gap: 10 (ref 5–15)
BUN: 69 mg/dL — ABNORMAL HIGH (ref 8–23)
CO2: 23 mmol/L (ref 22–32)
Calcium: 8.4 mg/dL — ABNORMAL LOW (ref 8.9–10.3)
Chloride: 103 mmol/L (ref 98–111)
Creatinine, Ser: 1.77 mg/dL — ABNORMAL HIGH (ref 0.61–1.24)
GFR calc Af Amer: 45 mL/min — ABNORMAL LOW (ref >60)
GFR calc non Af Amer: 39 mL/min — ABNORMAL LOW (ref >60)
Glucose, Bld: 223 mg/dL — ABNORMAL HIGH (ref 70–99)
Potassium: 4.4 mmol/L (ref 3.5–5.1)
Sodium: 136 mmol/L (ref 135–145)

## 2020-05-19 LAB — CBC WITH DIFFERENTIAL/PLATELET
Abs Immature Granulocytes: 0.04 10*3/uL (ref 0.00–0.07)
Basophils Absolute: 0 10*3/uL (ref 0.0–0.1)
Basophils Relative: 0 %
Eosinophils Absolute: 0.1 10*3/uL (ref 0.0–0.5)
Eosinophils Relative: 1 %
HCT: 44.7 % (ref 39.0–52.0)
Hemoglobin: 14.5 g/dL (ref 13.0–17.0)
Immature Granulocytes: 0 %
Lymphocytes Relative: 5 %
Lymphs Abs: 0.5 10*3/uL — ABNORMAL LOW (ref 0.7–4.0)
MCH: 31.7 pg (ref 26.0–34.0)
MCHC: 32.4 g/dL (ref 30.0–36.0)
MCV: 97.6 fL (ref 80.0–100.0)
Monocytes Absolute: 0.8 10*3/uL (ref 0.1–1.0)
Monocytes Relative: 8 %
Neutro Abs: 7.9 10*3/uL — ABNORMAL HIGH (ref 1.7–7.7)
Neutrophils Relative %: 86 %
Platelets: 182 10*3/uL (ref 150–400)
RBC: 4.58 MIL/uL (ref 4.22–5.81)
RDW: 14.7 % (ref 11.5–15.5)
WBC: 9.3 10*3/uL (ref 4.0–10.5)
nRBC: 0 % (ref 0.0–0.2)

## 2020-05-19 LAB — LACTIC ACID, PLASMA: Lactic Acid, Venous: 0.8 mmol/L (ref 0.5–1.9)

## 2020-05-19 LAB — TROPONIN I (HIGH SENSITIVITY)
Troponin I (High Sensitivity): 9 ng/L (ref <18)
Troponin I (High Sensitivity): 10 ng/L (ref <18)

## 2020-05-19 LAB — GLUCOSE, CAPILLARY: Glucose-Capillary: 421 mg/dL — ABNORMAL HIGH (ref 70–99)

## 2020-05-19 LAB — CBG MONITORING, ED: Glucose-Capillary: 335 mg/dL — ABNORMAL HIGH (ref 70–99)

## 2020-05-19 LAB — SARS CORONAVIRUS 2 BY RT PCR (HOSPITAL ORDER, PERFORMED IN ~~LOC~~ HOSPITAL LAB): SARS Coronavirus 2: NEGATIVE

## 2020-05-19 LAB — BRAIN NATRIURETIC PEPTIDE: B Natriuretic Peptide: 157 pg/mL — ABNORMAL HIGH (ref 0.0–100.0)

## 2020-05-19 MED ORDER — FUROSEMIDE 10 MG/ML IJ SOLN
80.0000 mg | Freq: Two times a day (BID) | INTRAMUSCULAR | Status: DC
  Administered 2020-05-19 – 2020-05-21 (×5): 80 mg via INTRAVENOUS
  Filled 2020-05-19 (×5): qty 8

## 2020-05-19 MED ORDER — ISOSORBIDE MONONITRATE ER 60 MG PO TB24
60.0000 mg | ORAL_TABLET | Freq: Every day | ORAL | Status: DC
  Administered 2020-05-20 – 2020-05-21 (×2): 60 mg via ORAL
  Filled 2020-05-19 (×4): qty 1

## 2020-05-19 MED ORDER — SODIUM CHLORIDE 0.9% FLUSH
3.0000 mL | Freq: Two times a day (BID) | INTRAVENOUS | Status: DC
  Administered 2020-05-19 – 2020-06-06 (×24): 3 mL via INTRAVENOUS

## 2020-05-19 MED ORDER — FLUTICASONE PROPIONATE 50 MCG/ACT NA SUSP
1.0000 | Freq: Every day | NASAL | Status: DC
  Administered 2020-05-22 – 2020-06-15 (×11): 1 via NASAL
  Filled 2020-05-19 (×2): qty 16

## 2020-05-19 MED ORDER — IPRATROPIUM BROMIDE HFA 17 MCG/ACT IN AERS: 2.0000 | INHALATION_SPRAY | Freq: Once | RESPIRATORY_TRACT | Status: DC

## 2020-05-19 MED ORDER — ALBUTEROL SULFATE HFA 108 (90 BASE) MCG/ACT IN AERS: 2.0000 | INHALATION_SPRAY | Freq: Once | RESPIRATORY_TRACT | Status: DC

## 2020-05-19 MED ORDER — ATORVASTATIN CALCIUM 80 MG PO TABS
80.0000 mg | ORAL_TABLET | Freq: Every day | ORAL | Status: DC
  Administered 2020-05-19 – 2020-05-24 (×6): 80 mg via ORAL
  Filled 2020-05-19 (×3): qty 2
  Filled 2020-05-19: qty 1
  Filled 2020-05-19 (×3): qty 2

## 2020-05-19 MED ORDER — INSULIN ASPART 100 UNIT/ML ~~LOC~~ SOLN
5.0000 [IU] | Freq: Once | SUBCUTANEOUS | Status: AC
  Administered 2020-05-19: 5 [IU] via SUBCUTANEOUS

## 2020-05-19 MED ORDER — FOLIC ACID 1 MG PO TABS
1.0000 mg | ORAL_TABLET | Freq: Every day | ORAL | Status: DC
  Administered 2020-05-19 – 2020-06-15 (×28): 1 mg via ORAL
  Filled 2020-05-19 (×28): qty 1

## 2020-05-19 MED ORDER — INSULIN ASPART 100 UNIT/ML ~~LOC~~ SOLN
0.0000 [IU] | Freq: Every day | SUBCUTANEOUS | Status: DC
  Administered 2020-05-20: 5 [IU] via SUBCUTANEOUS
  Administered 2020-05-21: 4 [IU] via SUBCUTANEOUS
  Administered 2020-05-22 – 2020-05-23 (×2): 5 [IU] via SUBCUTANEOUS

## 2020-05-19 MED ORDER — GABAPENTIN 300 MG PO CAPS
300.0000 mg | ORAL_CAPSULE | Freq: Three times a day (TID) | ORAL | Status: DC
  Administered 2020-05-19 – 2020-06-06 (×53): 300 mg via ORAL
  Filled 2020-05-19 (×53): qty 1

## 2020-05-19 MED ORDER — MOMETASONE FURO-FORMOTEROL FUM 200-5 MCG/ACT IN AERO
2.0000 | INHALATION_SPRAY | Freq: Two times a day (BID) | RESPIRATORY_TRACT | Status: DC
  Administered 2020-05-19 – 2020-06-13 (×47): 2 via RESPIRATORY_TRACT
  Filled 2020-05-19 (×2): qty 8.8

## 2020-05-19 MED ORDER — INSULIN ASPART 100 UNIT/ML ~~LOC~~ SOLN
0.0000 [IU] | Freq: Three times a day (TID) | SUBCUTANEOUS | Status: DC
  Administered 2020-05-19: 15 [IU] via SUBCUTANEOUS
  Administered 2020-05-20: 4 [IU] via SUBCUTANEOUS
  Administered 2020-05-20 (×2): 7 [IU] via SUBCUTANEOUS
  Administered 2020-05-21: 11 [IU] via SUBCUTANEOUS
  Administered 2020-05-21: 4 [IU] via SUBCUTANEOUS
  Administered 2020-05-21: 15 [IU] via SUBCUTANEOUS
  Administered 2020-05-22 (×2): 20 [IU] via SUBCUTANEOUS
  Administered 2020-05-22: 11 [IU] via SUBCUTANEOUS
  Administered 2020-05-23: 15 [IU] via SUBCUTANEOUS
  Administered 2020-05-23: 7 [IU] via SUBCUTANEOUS
  Administered 2020-05-23: 15 [IU] via SUBCUTANEOUS
  Administered 2020-05-24: 7 [IU] via SUBCUTANEOUS
  Administered 2020-05-24 (×2): 15 [IU] via SUBCUTANEOUS
  Filled 2020-05-19: qty 1

## 2020-05-19 MED ORDER — CLOPIDOGREL BISULFATE 75 MG PO TABS
75.0000 mg | ORAL_TABLET | Freq: Every day | ORAL | Status: DC
  Administered 2020-05-19 – 2020-05-24 (×6): 75 mg via ORAL
  Filled 2020-05-19 (×6): qty 1

## 2020-05-19 MED ORDER — ALBUTEROL SULFATE HFA 108 (90 BASE) MCG/ACT IN AERS: 4.0000 | INHALATION_SPRAY | Freq: Once | RESPIRATORY_TRACT | Status: DC

## 2020-05-19 MED ORDER — METOPROLOL TARTRATE 25 MG PO TABS
25.0000 mg | ORAL_TABLET | Freq: Two times a day (BID) | ORAL | Status: DC
  Administered 2020-05-19 – 2020-06-06 (×23): 25 mg via ORAL
  Filled 2020-05-19 (×29): qty 1

## 2020-05-19 MED ORDER — IPRATROPIUM-ALBUTEROL 0.5-2.5 (3) MG/3ML IN SOLN
3.0000 mL | RESPIRATORY_TRACT | Status: DC | PRN
  Administered 2020-05-24 – 2020-05-25 (×2): 3 mL via RESPIRATORY_TRACT
  Filled 2020-05-19 (×2): qty 3

## 2020-05-19 MED ORDER — INSULIN GLARGINE 100 UNIT/ML ~~LOC~~ SOLN
25.0000 [IU] | Freq: Every day | SUBCUTANEOUS | Status: DC
  Administered 2020-05-19 – 2020-05-23 (×5): 25 [IU] via SUBCUTANEOUS
  Filled 2020-05-19 (×6): qty 0.25

## 2020-05-19 MED ORDER — ASPIRIN EC 81 MG PO TBEC
81.0000 mg | DELAYED_RELEASE_TABLET | Freq: Every day | ORAL | Status: DC
  Administered 2020-05-19 – 2020-05-20 (×2): 81 mg via ORAL
  Filled 2020-05-19 (×2): qty 1

## 2020-05-19 MED ORDER — ONDANSETRON HCL 4 MG/2ML IJ SOLN: 4.0000 mg | Freq: Four times a day (QID) | INTRAMUSCULAR | Status: DC | PRN

## 2020-05-19 MED ORDER — SODIUM CHLORIDE 0.9 % IV SOLN: 250.0000 mL | INTRAVENOUS | Status: DC | PRN

## 2020-05-19 MED ORDER — METHYLPREDNISOLONE SODIUM SUCC 125 MG IJ SOLR
125.0000 mg | Freq: Once | INTRAMUSCULAR | Status: AC
  Administered 2020-05-19: 125 mg via INTRAVENOUS
  Filled 2020-05-19: qty 2

## 2020-05-19 MED ORDER — ACETAMINOPHEN 325 MG PO TABS: 650.0000 mg | ORAL_TABLET | ORAL | Status: DC | PRN

## 2020-05-19 MED ORDER — HEPARIN SODIUM (PORCINE) 5000 UNIT/ML IJ SOLN
5000.0000 [IU] | Freq: Three times a day (TID) | INTRAMUSCULAR | Status: DC
  Administered 2020-05-19 – 2020-05-20 (×3): 5000 [IU] via SUBCUTANEOUS
  Filled 2020-05-19 (×3): qty 1

## 2020-05-19 MED ORDER — PRIMIDONE 50 MG PO TABS
100.0000 mg | ORAL_TABLET | Freq: Every day | ORAL | Status: DC
  Administered 2020-05-19 – 2020-06-15 (×28): 100 mg via ORAL
  Filled 2020-05-19 (×33): qty 2

## 2020-05-19 MED ORDER — SODIUM CHLORIDE 0.9% FLUSH: 3.0000 mL | INTRAVENOUS | Status: DC | PRN

## 2020-05-19 MED ORDER — ALBUTEROL SULFATE HFA 108 (90 BASE) MCG/ACT IN AERS
2.0000 | INHALATION_SPRAY | Freq: Once | RESPIRATORY_TRACT | Status: AC
  Administered 2020-05-19: 2 via RESPIRATORY_TRACT
  Filled 2020-05-19: qty 6.7

## 2020-05-19 MED ORDER — FUROSEMIDE 10 MG/ML IJ SOLN
80.0000 mg | Freq: Once | INTRAMUSCULAR | Status: AC
  Administered 2020-05-19: 80 mg via INTRAVENOUS
  Filled 2020-05-19: qty 8

## 2020-05-19 NOTE — ED Triage Notes (Signed)
Pt presents to ED with complaints of continued increasing SOB. Pt placed on 4L of O2.

## 2020-05-19 NOTE — H&P (Addendum)
[facility] History and Physical  Robert [name] YBO:[id number] DOB: [birth year] DOA: 05/13/2020   PCP: Robert School, MD  Specialists: Dr. Sallyanne Kuster is his cardiologist  Chief Complaint: Shortness of breath and weight gain  HPI: Robert [name] is a 69 y.o. 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 He usually takes about 80 mg of Lasix once a day.  He has been compliant with his medication.  He presented to the emergency department on September 5.  He was given 2 doses of IV Lasix 80 mg each.  He felt better.  He was discharged home.  However he has gained a total of about 8 pounds in the last week or so.  He started developing shortness of breath in the last 24 hours and so he decided to come back to the hospital.  Denies any chest pain dizziness lightheadedness.  No syncopal episodes.  No fever or chills.  No nausea vomiting.  has also noticed some swelling in his legs although that has not changed much in the last several days.  Has history of orthopnea but no PND.  In the emergency department chest x-ray shows pulmonary edema.  He was noted to be hypoxic saturating in the late 80s.  He has oxygen at home but does not use it on a regular basis.  Due to refractory symptoms he will need hospitalization for further management.  Home Medications: Prior to Admission medications   Medication Sig Start Date End Date Taking? Authorizing Provider  acarbose (PRECOSE) 100 MG tablet Take 1 tablet (100 mg total) by mouth 3 (three) times daily with meals. 06/06/19  Yes Love, Ivan Anchors, PA-C  acetaminophen (TYLENOL) 650 MG CR tablet Take 650 mg by mouth every 8 (eight) hours as needed for pain.   Yes [provider]  aspirin EC 81 MG tablet Take 1 tablet (81 mg total) by mouth daily. 02/12/20  Yes Croitoru, Mihai, MD  atorvastatin (LIPITOR) 80 MG tablet Take 1 tablet (80 mg total) by mouth daily at 6 PM. 12/30/19  Yes Croitoru, Mihai, MD  clopidogrel (PLAVIX) 75 MG tablet Take 1 tablet (75 mg total) by mouth daily. 10/07/19  Yes Kroeger, Daleen Snook M., PA-C  fish oil-omega-3 fatty acids 1000 MG capsule Take 1 g by mouth daily.    Yes [provider]  fluticasone Asencion Islam) 50 MCG/ACT nasal spray  11/19/19  Yes [provider]  folic acid (FOLVITE) 1 MG tablet Take 1 tablet (1 mg total) by mouth daily. 06/06/19  Yes Love, Ivan Anchors, PA-C  furosemide (LASIX) 40 MG tablet Take 2 tablets (80 mg total) by mouth daily. 04/12/20  Yes Croitoru, Mihai, MD  gabapentin (NEURONTIN) 300 MG capsule Take 300 mg by mouth 3 (three) times daily.   Yes [provider]  glimepiride (AMARYL) 2 MG tablet Take 1 tablet (2 mg total) by mouth daily with breakfast. 06/06/19  Yes Love, Pamela S, PA-C  Insulin Glargine (TOUJEO SOLOSTAR [place]) Inject 25 Units into the skin at bedtime. 25 units    Yes [provider]  isosorbide mononitrate (IMDUR) 60 MG 24 hr tablet Take 1 tablet (60 mg total) by mouth daily. 02/12/20 02/06/21 Yes Croitoru, Mihai, MD  losartan (COZAAR) 25 MG tablet  Take 0.5 tablets (12.5 mg total) by mouth daily. 09/29/19  Yes Kroeger, Daleen Snook M., PA-C  metoprolol tartrate (LOPRESSOR) 25 MG tablet Take 1 tablet (25 mg total) by mouth 2 (two) times daily. 09/29/19  Yes Kroeger, Daleen Snook M., PA-C  mometasone-formoterol (DULERA) 200-5 MCG/ACT AERO Inhale 2 puffs into the lungs 2 (two) times daily. 06/06/19  Yes Love, Ivan Anchors, PA-C  nitroGLYCERIN (NITROSTAT) 0.4 MG SL tablet Place 1 tablet (0.4 mg total) under the tongue every 5 (five) minutes x 3 doses as needed for chest pain. 04/24/16  Yes Croitoru, Mihai, MD  potassium chloride SA (KLOR-CON) 20 MEQ tablet Take 1 tablet (20 mEq total) by mouth 2 (two)  times daily. 02/03/20  Yes Kroeger, Daleen Snook M., PA-C  primidone (MYSOLINE) 50 MG tablet Take 100 mg by mouth at bedtime. 09/18/19  Yes [provider]  ALPRAZolam Duanne Moron) 0.5 MG tablet Take 1 tablet (0.5 mg total) by mouth 3 (three) times daily as needed for anxiety or sleep. Patient not taking: Reported on 05/21/2020 06/06/19   Love, Ivan Anchors, PA-C  collagenase (SANTYL) ointment Apply topically daily. And cover with damp to dry dressing daily. Change more frequently if soiled 06/06/19   Love, Ivan Anchors, PA-C  terbinafine (LAMISIL) 250 MG tablet Take 250 mg by mouth daily. Patient not taking: Reported on 05/16/2020 11/26/19   [provider]  vitamin C (VITAMIN C) 250 MG tablet Take 1 tablet (250 mg total) by mouth 2 (two) times daily. Patient not taking: Reported on 06/01/2020 06/06/19   Bary Leriche, PA-C    Allergies:  Allergies  Allergen Reactions  . Ivp Dye [Iodinated Diagnostic Agents] Itching and Rash    Past Medical History: Past Medical History:  Diagnosis Date  . Anxiety   . CHF (congestive heart failure) ([place])   . Collagen vascular disease ([place])   . COPD (chronic obstructive pulmonary disease) ([place])   . Coronary artery disease   . COVID-19   . Full dentures   . GERD (gastroesophageal reflux disease)    Rolaids as needed  . High cholesterol   . History of MI (myocardial infarction)   . Hypertension    med. dosage increased 04/2016; has been on BP med. x 10 yrs.  . Incarcerated umbilical hernia 00/1749  . Inguinal hernia 05/2016   bilateral   . Insulin dependent diabetes mellitus   . Ischemic cardiomyopathy    a. 03/2015 EF 40-45% by LV gram.  . Left leg cellulitis 10/08/2016  . Rheumatoid arthritis(714.0)     Past Surgical History:  Procedure Laterality Date  . CARDIAC CATHETERIZATION N/A 02/01/2015   Procedure: Left Heart Cath and Coronary Angiography;  Surgeon: Belva Crome, MD;  Location: [place] [facility];  Service: Cardiovascular;  Laterality: N/A;  .  CARDIAC CATHETERIZATION N/A 03/22/2015   Procedure: Left Heart Cath and Coronary Angiography;  Surgeon: Leonie Man, MD;  Location: [place] [facility];  Service: Cardiovascular;  Laterality: N/A;  . CARDIAC CATHETERIZATION  09/05/2002; 03/09/2003; 04/10/2005;06/02/2005; 05/09/2006; 02/09/2007; 03/01/2007  . COLONOSCOPY  2008   Dr. Gala Romney: diverticulosis, hyperplastic polyp.  . CORONARY ANGIOPLASTY  11/14/2012; 02/01/2015  . CORONARY ANGIOPLASTY WITH STENT PLACEMENT  05/16/2012   "1; makes total ~ 4"  . CORONARY STENT INTERVENTION N/A 06/06/2017   Procedure: CORONARY STENT INTERVENTION;  Surgeon: Belva Crome, MD;  Location: [place] [facility];  Service: Cardiovascular;  Laterality: N/A;  . INSERTION OF MESH Bilateral 05/24/2016   Procedure: INSERTION OF MESH;  Surgeon:  Coralie Keens, MD;  Location: [place];  Service: General;  Laterality: Bilateral;  . LAPAROSCOPIC CHOLECYSTECTOMY    . LAPAROSCOPIC INGUINAL HERNIA WITH UMBILICAL HERNIA Bilateral 05/24/2016   Procedure: BILATERAL LAPAROSCOPIC INGUINAL HERNIA REPAIR WITH MESH AND UMBILICAL HERNIA REPAIR WITH MESH;  Surgeon: Coralie Keens, MD;  Location: [place];  Service: General;  Laterality: Bilateral;  . LEFT HEART CATH AND CORONARY ANGIOGRAPHY N/A 06/06/2017   Procedure: LEFT HEART CATH AND CORONARY ANGIOGRAPHY;  Surgeon: Belva Crome, MD;  Location: [place] [facility];  Service: Cardiovascular;  Laterality: N/A;  . LEFT HEART CATHETERIZATION WITH CORONARY ANGIOGRAM N/A 12/22/2011   Procedure: LEFT HEART CATHETERIZATION WITH CORONARY ANGIOGRAM;  Surgeon: Troy Sine, MD;  Location: [facility] CATH LAB;  Service: Cardiovascular;  Laterality: N/A;  . LEFT HEART CATHETERIZATION WITH CORONARY ANGIOGRAM Bilateral 05/16/2012   Procedure: LEFT HEART CATHETERIZATION WITH CORONARY ANGIOGRAM;  Surgeon: Lorretta Harp, MD;  Location: [facility] CATH LAB;  Service: Cardiovascular;  Laterality: Bilateral;  . LEFT HEART CATHETERIZATION  WITH CORONARY ANGIOGRAM N/A 11/14/2012   Procedure: LEFT HEART CATHETERIZATION WITH CORONARY ANGIOGRAM;  Surgeon: Troy Sine, MD;  Location: [facility] CATH LAB;  Service: Cardiovascular;  Laterality: N/A;  . LEFT HEART CATHETERIZATION WITH CORONARY ANGIOGRAM N/A 09/01/2013   Procedure: LEFT HEART CATHETERIZATION WITH CORONARY ANGIOGRAM;  Surgeon: Leonie Man, MD;  Location: [facility] CATH LAB;  Service: Cardiovascular;  Laterality: N/A;  . [place]; 1973; 1985   x 3  . NM MYOCAR PERF WALL MOTION  08/30/2009   No significant ischemia  . OPEN REDUCTION INTERNAL FIXATION (ORIF) DISTAL RADIAL FRACTURE Right 12/10/2016   Procedure: OPEN REDUCTION INTERNAL FIXATION (ORIF) DISTAL RADIAL FRACTURE;  Surgeon: Iran Planas, MD;  Location: [place];  Service: Orthopedics;  Laterality: Right;  . PERCUTANEOUS CORONARY INTERVENTION-BALLOON ONLY  12/22/2011   Procedure: PERCUTANEOUS CORONARY INTERVENTION-BALLOON ONLY;  Surgeon: Troy Sine, MD;  Location: [facility] CATH LAB;  Service: Cardiovascular;;  . PERCUTANEOUS CORONARY INTERVENTION-BALLOON ONLY  11/14/2012   Procedure: PERCUTANEOUS CORONARY INTERVENTION-BALLOON ONLY;  Surgeon: Troy Sine, MD;  Location: [facility] CATH LAB;  Service: Cardiovascular;;  . PERCUTANEOUS CORONARY STENT INTERVENTION (PCI-S)  05/16/2012   Procedure: PERCUTANEOUS CORONARY STENT INTERVENTION (PCI-S);  Surgeon: Lorretta Harp, MD;  Location: [place] CATH LAB;  Service: Cardiovascular;;  . PERCUTANEOUS CORONARY STENT INTERVENTION (PCI-S)  09/01/2013   Procedure: PERCUTANEOUS CORONARY STENT INTERVENTION (PCI-S);  Surgeon: Leonie Man, MD;  Location: [facility] CATH LAB;  Service: Cardiovascular;;    Social History: He lives locally.  Smokes cigarettes occasionally but not on a daily basis.  Does not drink any alcohol.  Usually independent with daily activities.  Family History:  Family History  Problem Relation Age of Onset  . Heart disease Father   . Colon cancer Brother        early 41s  .  Hypertension Mother   . Heart disease Mother   . Colon cancer Mother        died age 28. late 39s early 24s at diagnosis.   . Lung cancer Mother      Review of Systems - History obtained from the patient General ROS: positive for  - fatigue Psychological ROS: negative Ophthalmic ROS: negative ENT ROS: negative Allergy and Immunology ROS: negative Hematological and Lymphatic ROS: negative Endocrine ROS: negative Respiratory ROS: As in HPI Cardiovascular ROS: As in HPI Gastrointestinal ROS: no abdominal pain, change in bowel habits, or black or bloody stools Genito-Urinary ROS:  no dysuria, trouble voiding, or hematuria Musculoskeletal ROS: negative Neurological ROS: no TIA or stroke symptoms Dermatological ROS: negative  Physical Examination  Vitals:   05/15/2020 1400 06/05/2020 1430 05/16/2020 1500 05/28/2020 1530  BP: (!) 131/92 122/77 (!) 125/97 (!) 133/100  Pulse: (!) 131 79 (!) 54 (!) 51  Resp: (!) 30 (!) 26 (!) 26 (!) 28  Temp:      TempSrc:      SpO2: 100% 97% 98% 94%  Weight:      Height:        BP (!) 133/100   Pulse (!) 51   Temp 98.6 F (37 C) (Rectal)   Resp (!) 28   Ht 6\' 3"  (1.905 m)   Wt 108.4 kg   SpO2 94%   BMI 29.87 kg/m   General appearance: alert, cooperative, appears stated age and no distress Head: Normocephalic, without obvious abnormality, atraumatic Eyes: conjunctivae/corneas clear. PERRL, EOM's intact. Throat: lips, mucosa, and tongue normal; teeth and gums normal Neck: no adenopathy, no carotid bruit, no JVD, supple, symmetrical, trachea midline and thyroid not enlarged, symmetric, no tenderness/mass/nodules Back: symmetric, no curvature. ROM normal. No CVA tenderness. Resp: Normal effort at rest.  Crackles bilateral bases.  No wheezing appreciated. Cardio: regular rate and rhythm, S1, S2 normal, no murmur, click, rub or gallop GI: soft, non-tender; bowel sounds normal; no masses,  no organomegaly Extremities: 1+ pitting edema bilateral lower  extremities.  Chronic skin changes.  Extremities are cold to touch.  This is usual per patient. Pulses: Weak dorsalis pedis pulses bilaterally.  Difficult to palpate likely due to the edema. Skin: Chronic skin changes bilateral lower extremities Lymph nodes: Cervical, supraclavicular, and axillary nodes normal. Neurologic: Alert and oriented x3.  No obvious focal neurological deficits.   Labs on Admission: I have personally reviewed following labs and imaging studies  CBC: Recent Labs  Lab 05/16/20 1228 06/01/2020 1310  WBC 8.2 9.3  NEUTROABS 6.7 7.9*  HGB 14.4 14.5  HCT 44.9 44.7  MCV 97.8 97.6  PLT 160 562   Basic Metabolic Panel: Recent Labs  Lab 05/16/20 1228 06/10/2020 1310  NA 135 136  K 4.4 4.4  CL 104 103  CO2 24 23  GLUCOSE 185* 223*  BUN 49* 69*  CREATININE 1.63* 1.77*  CALCIUM 8.3* 8.4*   GFR: Estimated Creatinine Clearance: 53.9 mL/min (A) (by C-G formula based on SCr of 1.77 mg/dL (H)). Liver Function Tests: Recent Labs  Lab 05/16/20 1228  AST 30  ALT 57*  ALKPHOS 97  BILITOT 0.7  PROT 6.4*  ALBUMIN 3.2*     Radiological Exams on Admission: DG Chest Portable 1 View  Result Date: 05/17/2020 CLINICAL DATA:  Chest x-ray 05/16/2020, chest x-ray 07/03/2019 EXAM: PORTABLE CHEST 1 VIEW COMPARISON:  None. FINDINGS: The heart size and mediastinal contours are unchanged with an enlarged cardiac silhouette that may be partially due to a portable AP technique. Aortic arch and descending thoracic aortic calcifications. Vascular hilar fullness. Persistent bilateral perihilar and basilar patchy airspace opacities. Persistent trace left pleural effusion. Nonspecific blunting of the right costophrenic angle. No pneumothorax bilaterally. The visualized skeletal structures are unremarkable. Suggestion of a coronary stent. IMPRESSION: Similar appearing pulmonary edema with suggestion of a left small pleural effusion. Electronically Signed   By: Iven Finn M.D.   On:  05/15/2020 13:16    My interpretation of Electrocardiogram: EKG is a poor quality with a baseline wander.  Noted to have sinus tachycardia.  PVCs noted.  No obvious ischemic  changes but again EKG is poor quality and will need to be repeated.    Problem List  Principal Problem:   Acute CHF (congestive heart failure) (HCC) Active Problems:   COPD (chronic obstructive pulmonary disease) (HCC)   Controlled type 2 diabetes mellitus with diabetic peripheral angiopathy without gangrene, with long-term current use of insulin (HCC)   Acute respiratory failure with hypoxemia (HCC)   CKD (chronic kidney disease), stage III   Assessment: This is a 69 year old Caucasian male with a past medical history as stated earlier who comes in with 7 to 8-day history of worsening shortness of breath and weight gain.  Symptoms are consistent with CHF exacerbation.  He has a previous history of systolic dysfunction but his last echocardiogram from September 2020 showed EF of 50 to 55%.  He could have diastolic CHF.  COVID-19 test was negative.  Plan:  1. Acute diastolic CHF exacerbation/acute respiratory failure with hypoxia: Echocardiogram will be repeated.  Patient will be placed on intravenous diuretics.  Strict ins and outs and daily weights.  Cardiology will be consulted to assist with management.  Patient currently denies any chest pain.  EKG will be repeated in the morning.  Troponin is normal.  Currently requiring oxygen.  Apparently does have oxygen at home if needed but does not use it on a regular basis.  2.  Acute on chronic kidney disease stage IIIa: Baseline creatinine around 1.5.  Presented with a creatinine of 1.77.  Could be due to poor perfusion.  Monitor closely while he is getting high-dose diuretics.  Recheck renal function tomorrow morning.  Monitor urine output.  May need to involve nephrology if creatinine continues to worsen.  3.  History of coronary artery disease: Continue aspirin and  Plavix.  Currently denies any chest pain.  Continue beta-blocker and statin.  4.  Insulin-dependent diabetes mellitus with peripheral neuropathy: Continue insulin.  He is on Toujeo at home.  We will use Lantus here.  SSI.  Check HbA1c.  5.  History of COPD: Currently not wheezing.  He was given steroids in the ED.  Will not be continued.  Nebs as needed.  6.  Previous history of paroxysmal atrial fibrillation: Apparently his atrial fibrillation occurred in the setting of severe COVID-19 infection.  He was on Eliquis for a while but it was discontinued by his cardiologist according to the patient.  7. History of peripheral artery disease: He has extensive obstructive disease in lower extremities.  Outpatient management.  8.  Essential hypertension: Monitor blood pressure closely while he is getting diuresed.   DVT Prophylaxis: Subcutaneous heparin Code Status: Full code Family Communication: Discussed with the patient Disposition: Hopefully return home in improved Consults called: Cardiology Admission Status: Status is: Inpatient  Remains inpatient appropriate because:IV treatments appropriate due to intensity of illness or inability to take PO and Inpatient level of care appropriate due to severity of illness   Dispo: The patient is from: Home              Anticipated d/c is to: Home              Anticipated d/c date is: 3 days              Patient currently is not medically stable to d/c.   Severity of Illness: The appropriate patient status for this patient is INPATIENT. Inpatient status is judged to be reasonable and necessary in order to provide the required intensity of service to ensure the patient's safety.  The patient's presenting symptoms, physical exam findings, and initial radiographic and laboratory data in the context of their chronic comorbidities is felt to place them at high risk for further clinical deterioration. Furthermore, it is not anticipated that the patient will  be medically stable for discharge from the hospital within 2 midnights of admission. The following factors support the patient status of inpatient.   " The patient's presenting symptoms include shortness of breath. " The worrisome physical exam findings include this in the lungs, pedal edema. " The initial radiographic and laboratory data are worrisome because of pulmonary edema on chest x-ray. " The chronic co-morbidities include diabetes.   * I certify that at the point of admission it is my clinical judgment that the patient will require inpatient hospital care spanning beyond 2 midnights from the point of admission due to high intensity of service, high risk for further deterioration and high frequency of surveillance required.*  Further management decisions will depend on results of further testing and patient's response to treatment.   Maat Kafer [employer]  [facility] operational officer on [email or web address]  05/26/2020, 4:59 PM

## 2020-05-19 NOTE — ED Provider Notes (Signed)
[facility] EMERGENCY DEPARTMENT Provider Note   CSN: [id number] Arrival date & time: 05/30/2020  1222    History Chief Complaint  Patient presents with  . Shortness of Breath    Robert [name] is a 69 y.o. male with past medical history significant for CHF, COPD, CAD who presents for evaluation of shortness of breath.  Patient seen here in the emergency department 3 days ago with similar complaints.  Received 160 IV Lasix.  He has been taking his Lasix at home.  States his weight is up 6 or 7 pounds over the last 2 days.  He feels like he is not urinating like he should be.  Feels like his lower extremities and into his abdomen are swollen.  Increase shortness of breath.  He is unable to lay flat.  He is coughing up clear sputum.  He is vaccinated against Covid and had Covid last December.  Had negative Covid test 3 days ago.  Patient states compliance with his Lasix however he is not any improvement.  He denies fever, chills, nausea, vomiting, congestion, rhinorrhea, loss of taste and smell, abdominal pain, diarrhea, dysuria.  States he feels tight across his chest however denies overt chest pain.  He does have home oxygen "just in case" but does not use this.  Denies any known Covid exposures.  Denies issue aggravating or alleviating factors.  Current tobacco user  History obtained from patient and past medical records.  No interpreter is used  HPI     Past Medical History:  Diagnosis Date  . Anxiety   . CHF (congestive heart failure) ([place])   . Collagen vascular disease ([place])   . COPD (chronic obstructive pulmonary disease) ([place])   . Coronary artery disease   . COVID-19   . Full dentures   . GERD (gastroesophageal reflux disease)    Rolaids as needed  . High cholesterol   . History of MI (myocardial infarction)   . Hypertension    med. dosage increased 04/2016; has been on BP med. x 10 yrs.  . Incarcerated umbilical hernia 65/7846  . Inguinal hernia 05/2016   bilateral   .  Insulin dependent diabetes mellitus   . Ischemic cardiomyopathy    a. 03/2015 EF 40-45% by LV gram.  . Left leg cellulitis 10/08/2016  . Rheumatoid arthritis(714.0)     Patient Active Problem List   Diagnosis Date Noted  . Abnormality of gait 10/02/2019  . Leg length discrepancy 10/02/2019  . Primary osteoarthritis of right knee 08/28/2019  . Chronic pain of both knees 08/04/2019  . Acute on chronic combined systolic and diastolic CHF (congestive heart failure) ([place]) 07/05/2019  . Aspiration pneumonia ([place]) 07/04/2019  . Unspecified atrial fibrillation ([place]) 07/04/2019  . Anemia 07/04/2019  . Aspiration pneumonitis ([place]) 07/04/2019  . Acute respiratory failure with hypoxia ([place])   . CKD (chronic kidney disease), stage III   . Controlled type 2 diabetes mellitus with hyperglycemia, without long-term current use of insulin ([place])   . Urinary retention   . 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 Hypokalemia   . Physical debility 05/22/2019  . Demand ischemia ([place]) 05/20/2019  . Flash pulmonary edema ([place]) 05/20/2019  . Stage II decubitus ulcer ([place]) 05/18/2019  . Aspiration pneumonia of both lower lobes due to gastric secretions ([place]) 05/15/2019  . MRSA pneumonia ([place]) 05/15/2019  . Severe sepsis with septic shock ([place]) 05/15/2019  . NSTEMI (non-ST elevated myocardial infarction) ([place]) 05/15/2019  . Staphylococcal pneumonia ([place])  04/20/2019  . HCAP (healthcare-associated pneumonia) 04/20/2019  . Pneumonia due to COVID-19 virus 04/18/2019  . COPD (chronic obstructive pulmonary disease) with emphysema ([place]) 04/18/2019  . AKI (acute kidney injury) ([place]) 04/18/2019  . Diabetes mellitus type 2, uncontrolled, with complications ([place]) 10/62/6948  . Pressure injury of skin 04/18/2019  . Atrial fibrillation with RVR ([place]) 04/15/2019  . COVID-19 virus detected 04/12/2019  . Acute respiratory distress syndrome (ARDS) due to COVID-19 virus ([place]) 04/12/2019  . Hypoxia   . Sepsis ([place])   .  PAD (peripheral artery disease) ([place]) 03/27/2019  . Pulmonary edema 08/27/2018  . Acute respiratory failure with hypoxemia ([place]) 08/27/2018  . Chronic combined systolic and diastolic heart failure ([place]) 08/27/2018  . Dyslipidemia (high LDL; low HDL) 03/29/2018  . Dysphagia 01/02/2018  . Diarrhea 01/02/2018  . Family history of colon cancer 01/02/2018  . Ischemic cardiomyopathy   . Insulin dependent diabetes mellitus   . Hypertension   . History of MI (myocardial infarction)   . GERD (gastroesophageal reflux disease)   . Full dentures   . Coronary artery disease   . Anxiety   . Acute ST elevation myocardial infarction (STEMI) of inferior wall ([place]) 06/06/2017  . Varicose veins of left lower extremity with complications 54/62/7035  . Cellulitis of left leg without foot   . Right radial fracture 12/11/2016  . T3 vertebral fracture ([place]) 12/11/2016  . Fall 12/09/2016  . Thrombocytopenia ([place]) 10/09/2016  . Left leg cellulitis 10/08/2016  . Diabetes ([place]) 10/08/2016  . Inguinal hernia 05/12/2016  . Incarcerated umbilical hernia 00/93/8182  . Chronic combined systolic and diastolic CHF (congestive heart failure) ([place]) 04/25/2016  . High cholesterol   . Controlled type 2 diabetes mellitus with diabetic peripheral angiopathy without gangrene, with long-term current use of insulin ([place])   . Essential hypertension   . Non-STEMI (non-ST elevated myocardial infarction) ([place])   . CAD S/P percutaneous coronary angioplasty   . Unstable angina ([place]) 03/20/2015  . ICM-EF 35% at cath 02/01/15 02/02/2015  . DM type 2 causing vascular disease, not at goal [facility]) 05/29/2014  . Dyspnea 10/20/2013  . COPD (chronic obstructive pulmonary disease) ([place]) 09/03/2013  . Hyperlipidemia   . Old myocardial infarction 09/01/2013  . Abnormal nuclear stress test 05/17/2012  . S/P CFX PTCAI for ISR 02/01/15 05/17/2012  . PVD, chronic LLE 12/22/2011  . Rheumatoid arthritis ([place]) 12/22/2011  . Contrast media  allergy 12/22/2011  . Tobacco abuse 12/21/2011    Past Surgical History:  Procedure Laterality Date  . CARDIAC CATHETERIZATION N/A 02/01/2015   Procedure: Left Heart Cath and Coronary Angiography;  Surgeon: Belva Crome, MD;  Location: [place] [facility];  Service: Cardiovascular;  Laterality: N/A;  . CARDIAC CATHETERIZATION N/A 03/22/2015   Procedure: Left Heart Cath and Coronary Angiography;  Surgeon: Leonie Man, MD;  Location: [place] [facility];  Service: Cardiovascular;  Laterality: N/A;  . CARDIAC CATHETERIZATION  09/05/2002; 03/09/2003; 04/10/2005;06/02/2005; 05/09/2006; 02/09/2007; 03/01/2007  . COLONOSCOPY  2008   Dr. Gala Romney: diverticulosis, hyperplastic polyp.  . CORONARY ANGIOPLASTY  11/14/2012; 02/01/2015  . CORONARY ANGIOPLASTY WITH STENT PLACEMENT  05/16/2012   "1; makes total ~ 4"  . CORONARY STENT INTERVENTION N/A 06/06/2017   Procedure: CORONARY STENT INTERVENTION;  Surgeon: Belva Crome, MD;  Location: [place] [facility];  Service: Cardiovascular;  Laterality: N/A;  . INSERTION OF MESH Bilateral 05/24/2016   Procedure: INSERTION OF MESH;  Surgeon: Coralie Keens, MD;  Location: [place];  Service: General;  Laterality: Bilateral;  . LAPAROSCOPIC CHOLECYSTECTOMY    . LAPAROSCOPIC INGUINAL HERNIA WITH UMBILICAL HERNIA Bilateral 05/24/2016   Procedure: BILATERAL LAPAROSCOPIC INGUINAL HERNIA REPAIR WITH MESH AND UMBILICAL HERNIA REPAIR WITH MESH;  Surgeon: Coralie Keens, MD;  Location: [facility];  Service: General;  Laterality: Bilateral;  . LEFT HEART CATH AND CORONARY ANGIOGRAPHY N/A 06/06/2017   Procedure: LEFT HEART CATH AND CORONARY ANGIOGRAPHY;  Surgeon: Belva Crome, MD;  Location: [place] [facility];  Service: Cardiovascular;  Laterality: N/A;  . LEFT HEART CATHETERIZATION WITH CORONARY ANGIOGRAM N/A 12/22/2011   Procedure: LEFT HEART CATHETERIZATION WITH CORONARY ANGIOGRAM;  Surgeon: Troy Sine, MD;  Location: [facility] CATH LAB;  Service:  Cardiovascular;  Laterality: N/A;  . LEFT HEART CATHETERIZATION WITH CORONARY ANGIOGRAM Bilateral 05/16/2012   Procedure: LEFT HEART CATHETERIZATION WITH CORONARY ANGIOGRAM;  Surgeon: Lorretta Harp, MD;  Location: [facility] CATH LAB;  Service: Cardiovascular;  Laterality: Bilateral;  . LEFT HEART CATHETERIZATION WITH CORONARY ANGIOGRAM N/A 11/14/2012   Procedure: LEFT HEART CATHETERIZATION WITH CORONARY ANGIOGRAM;  Surgeon: Troy Sine, MD;  Location: [facility] CATH LAB;  Service: Cardiovascular;  Laterality: N/A;  . LEFT HEART CATHETERIZATION WITH CORONARY ANGIOGRAM N/A 09/01/2013   Procedure: LEFT HEART CATHETERIZATION WITH CORONARY ANGIOGRAM;  Surgeon: Leonie Man, MD;  Location: [facility] CATH LAB;  Service: Cardiovascular;  Laterality: N/A;  . [place]; 1973; 1985   x 3  . NM MYOCAR PERF WALL MOTION  08/30/2009   No significant ischemia  . OPEN REDUCTION INTERNAL FIXATION (ORIF) DISTAL RADIAL FRACTURE Right 12/10/2016   Procedure: OPEN REDUCTION INTERNAL FIXATION (ORIF) DISTAL RADIAL FRACTURE;  Surgeon: Iran Planas, MD;  Location: [place];  Service: Orthopedics;  Laterality: Right;  . PERCUTANEOUS CORONARY INTERVENTION-BALLOON ONLY  12/22/2011   Procedure: PERCUTANEOUS CORONARY INTERVENTION-BALLOON ONLY;  Surgeon: Troy Sine, MD;  Location: [facility] CATH LAB;  Service: Cardiovascular;;  . PERCUTANEOUS CORONARY INTERVENTION-BALLOON ONLY  11/14/2012   Procedure: PERCUTANEOUS CORONARY INTERVENTION-BALLOON ONLY;  Surgeon: Troy Sine, MD;  Location: [facility] CATH LAB;  Service: Cardiovascular;;  . PERCUTANEOUS CORONARY STENT INTERVENTION (PCI-S)  05/16/2012   Procedure: PERCUTANEOUS CORONARY STENT INTERVENTION (PCI-S);  Surgeon: Lorretta Harp, MD;  Location: [facility] CATH LAB;  Service: Cardiovascular;;  . PERCUTANEOUS CORONARY STENT INTERVENTION (PCI-S)  09/01/2013   Procedure: PERCUTANEOUS CORONARY STENT INTERVENTION (PCI-S);  Surgeon: Leonie Man, MD;  Location: [facility] CATH LAB;  Service: Cardiovascular;;        Family History  Problem Relation Age of Onset  . Heart disease Father   . Colon cancer Brother        early 56s  . Hypertension Mother   . Heart disease Mother   . Colon cancer Mother        died age 22. late 26s early 39s at diagnosis.   . Lung cancer Mother     Social History   Tobacco Use  . Smoking status: Current Some Day Smoker    Packs/day: 1.00    Types: Cigarettes    Last attempt to quit: 06/11/2015    Years since quitting: 4.9  . Smokeless tobacco: Never Used  . Tobacco comment: pt states smoking less than half a pack  Vaping Use  . Vaping Use: Never used  Substance Use Topics  . Alcohol use: No  . Drug use: No    Home Medications Prior to Admission medications   Medication Sig Start Date End Date Taking? Authorizing Provider  acarbose (PRECOSE) 100 MG tablet  Take 1 tablet (100 mg total) by mouth 3 (three) times daily with meals. 06/06/19  Yes Love, Ivan Anchors, PA-C  acetaminophen (TYLENOL) 650 MG CR tablet Take 650 mg by mouth every 8 (eight) hours as needed for pain.   Yes [provider]  aspirin EC 81 MG tablet Take 1 tablet (81 mg total) by mouth daily. 02/12/20  Yes Croitoru, Mihai, MD  atorvastatin (LIPITOR) 80 MG tablet Take 1 tablet (80 mg total) by mouth daily at 6 PM. 12/30/19  Yes Croitoru, Mihai, MD  clopidogrel (PLAVIX) 75 MG tablet Take 1 tablet (75 mg total) by mouth daily. 10/07/19  Yes Kroeger, Daleen Snook M., PA-C  fish oil-omega-3 fatty acids 1000 MG capsule Take 1 g by mouth daily.    Yes [provider]  fluticasone Asencion Islam) 50 MCG/ACT nasal spray  11/19/19  Yes [provider]  folic acid (FOLVITE) 1 MG tablet Take 1 tablet (1 mg total) by mouth daily. 06/06/19  Yes Love, Ivan Anchors, PA-C  furosemide (LASIX) 40 MG tablet Take 2 tablets (80 mg total) by mouth daily. 04/12/20  Yes Croitoru, Mihai, MD  gabapentin (NEURONTIN) 300 MG capsule Take 300 mg by mouth 3 (three) times daily.   Yes [provider]  glimepiride  (AMARYL) 2 MG tablet Take 1 tablet (2 mg total) by mouth daily with breakfast. 06/06/19  Yes Love, Pamela S, PA-C  Insulin Glargine (TOUJEO SOLOSTAR [place]) Inject 25 Units into the skin at bedtime. 25 units    Yes [provider]  isosorbide mononitrate (IMDUR) 60 MG 24 hr tablet Take 1 tablet (60 mg total) by mouth daily. 02/12/20 02/06/21 Yes Croitoru, Mihai, MD  losartan (COZAAR) 25 MG tablet Take 0.5 tablets (12.5 mg total) by mouth daily. 09/29/19  Yes Kroeger, Daleen Snook M., PA-C  metoprolol tartrate (LOPRESSOR) 25 MG tablet Take 1 tablet (25 mg total) by mouth 2 (two) times daily. 09/29/19  Yes Kroeger, Daleen Snook M., PA-C  mometasone-formoterol (DULERA) 200-5 MCG/ACT AERO Inhale 2 puffs into the lungs 2 (two) times daily. 06/06/19  Yes Love, Ivan Anchors, PA-C  nitroGLYCERIN (NITROSTAT) 0.4 MG SL tablet Place 1 tablet (0.4 mg total) under the tongue every 5 (five) minutes x 3 doses as needed for chest pain. 04/24/16  Yes Croitoru, Mihai, MD  potassium chloride SA (KLOR-CON) 20 MEQ tablet Take 1 tablet (20 mEq total) by mouth 2 (two) times daily. 02/03/20  Yes Kroeger, Daleen Snook M., PA-C  primidone (MYSOLINE) 50 MG tablet Take 100 mg by mouth at bedtime. 09/18/19  Yes [provider]  ALPRAZolam Duanne Moron) 0.5 MG tablet Take 1 tablet (0.5 mg total) by mouth 3 (three) times daily as needed for anxiety or sleep. Patient not taking: Reported on 05/15/2020 06/06/19   Love, Ivan Anchors, PA-C  collagenase (SANTYL) ointment Apply topically daily. And cover with damp to dry dressing daily. Change more frequently if soiled 06/06/19   Love, Ivan Anchors, PA-C  terbinafine (LAMISIL) 250 MG tablet Take 250 mg by mouth daily. Patient not taking: Reported on 06/08/2020 11/26/19   [provider]  vitamin C (VITAMIN C) 250 MG tablet Take 1 tablet (250 mg total) by mouth 2 (two) times daily. Patient not taking: Reported on 05/15/2020 06/06/19   Bary Leriche, PA-C    Allergies    Ivp dye [iodinated diagnostic agents]  Review  of Systems   Review of Systems  Constitutional: Positive for activity change, appetite change and fatigue.  HENT: Negative.   Respiratory: Positive for cough, chest  tightness and shortness of breath.   Cardiovascular: Positive for leg swelling.  Gastrointestinal: Negative.   Genitourinary: Negative.   Musculoskeletal: Negative.   Skin: Negative.   Neurological: Positive for weakness (Generalized). Negative for dizziness, tremors, seizures, syncope, facial asymmetry, speech difficulty, light-headedness, numbness and headaches.  All other systems reviewed and are negative.   Physical Exam Updated Vital Signs BP (!) 133/100   Pulse (!) 51   Temp 98.6 F (37 C) (Rectal)   Resp (!) 28   Ht 6\' 3"  (1.905 m)   Wt 108.4 kg   SpO2 94%   BMI 29.87 kg/m   Physical Exam Vitals and nursing note reviewed.  Constitutional:      General: He is not in acute distress.    Appearance: He is well-developed. He is not ill-appearing, toxic-appearing or diaphoretic.  HENT:     Head: Normocephalic and atraumatic.     Mouth/Throat:     Pharynx: Oropharynx is clear.  Eyes:     Pupils: Pupils are equal, round, and reactive to light.  Cardiovascular:     Rate and Rhythm: Regular rhythm. Tachycardia present.     Pulses: Normal pulses.     Heart sounds: Normal heart sounds.  Pulmonary:     Effort: Pulmonary effort is normal. Tachypnea present. No respiratory distress.     Comments: Hypoxic to 88% RA with tachypnea on arrival. Wheeze R>L with rales Chest:     Comments: Equal rise and fall to chest wall, No crepitus Abdominal:     General: Bowel sounds are normal. There is no distension.     Palpations: Abdomen is soft.     Comments: Distended. No fluid wave. No tender to palpation  Musculoskeletal:        General: Normal range of motion.     Cervical back: Normal range of motion and neck supple.     Right lower leg: No tenderness. Edema present.     Left lower leg: No tenderness. Edema present.      Comments: 3+ pitting edema to BL extremities. Erythema to LLE however patient states chronic  Skin:    General: Skin is warm and dry.     Capillary Refill: Capillary refill takes less than 2 seconds.     Findings: Erythema present.  Neurological:     Mental Status: He is alert.    ED Results / Procedures / Treatments   Labs (all labs ordered are listed, but only abnormal results are displayed) Labs Reviewed  CBC WITH DIFFERENTIAL/PLATELET - Abnormal; Notable for the following components:      Result Value   Neutro Abs 7.9 (*)    Lymphs Abs 0.5 (*)    All other components within normal limits  BASIC METABOLIC PANEL - Abnormal; Notable for the following components:   Glucose, Bld 223 (*)    BUN 69 (*)    Creatinine, Ser 1.77 (*)    Calcium 8.4 (*)    GFR calc non Af Amer 39 (*)    GFR calc Af Amer 45 (*)    All other components within normal limits  BRAIN NATRIURETIC PEPTIDE - Abnormal; Notable for the following components:   B Natriuretic Peptide 157.0 (*)    All other components within normal limits  CULTURE, BLOOD (ROUTINE X 2)  CULTURE, BLOOD (ROUTINE X 2)  SARS CORONAVIRUS 2 BY RT PCR (HOSPITAL ORDER, [facility])  LACTIC ACID, PLASMA  LACTIC ACID, PLASMA  TROPONIN I (HIGH SENSITIVITY)  TROPONIN  I (HIGH SENSITIVITY)    EKG EKG Interpretation  Date/Time:  Wednesday May 19 2020 12:56:03 EDT Ventricular Rate:  140 PR Interval:    QRS Duration: 99 QT Interval:  307 QTC Calculation: 467 R Axis:   -27 Text Interpretation: Sinus tachycardia Multiple ventricular premature complexes Non-specific ST-t changes Baseline wander Poor data quality Confirmed by Lajean Saver [phone]) on 05/24/2020 1:09:35 PM   Radiology DG Chest Portable 1 View  Result Date: 05/18/2020 CLINICAL DATA:  Chest x-ray 05/16/2020, chest x-ray 07/03/2019 EXAM: PORTABLE CHEST 1 VIEW COMPARISON:  None. FINDINGS: The heart size and mediastinal contours are unchanged  with an enlarged cardiac silhouette that may be partially due to a portable AP technique. Aortic arch and descending thoracic aortic calcifications. Vascular hilar fullness. Persistent bilateral perihilar and basilar patchy airspace opacities. Persistent trace left pleural effusion. Nonspecific blunting of the right costophrenic angle. No pneumothorax bilaterally. The visualized skeletal structures are unremarkable. Suggestion of a coronary stent. IMPRESSION: Similar appearing pulmonary edema with suggestion of a left small pleural effusion. Electronically Signed   By: Iven Finn M.D.   On: 05/26/2020 13:16     LAST ECHO 2020 EF 50%     Procedures .Critical Care Performed by: Nettie Elm, PA-C Authorized by: Nettie Elm, PA-C   Critical care provider statement:    Critical care time (minutes):  35   Critical care was necessary to treat or prevent imminent or life-threatening deterioration of the following conditions:  Respiratory failure, cardiac failure and circulatory failure   Critical care was time spent personally by me on the following activities:  Discussions with consultants, evaluation of patient's response to treatment, examination of patient, ordering and performing treatments and interventions, ordering and review of laboratory studies, ordering and review of radiographic studies, pulse oximetry, re-evaluation of patient's condition, obtaining history from patient or surrogate and review of old charts   (including critical care time)  Medications Ordered in ED Medications  albuterol (VENTOLIN HFA) 108 (90 Base) MCG/ACT inhaler 2 puff (2 puffs Inhalation Not Given 06/05/2020 1459)  albuterol (VENTOLIN HFA) 108 (90 Base) MCG/ACT inhaler 4 puff (4 puffs Inhalation Not Given 05/18/2020 1459)  methylPREDNISolone sodium succinate (SOLU-MEDROL) 125 mg/2 mL injection 125 mg (125 mg Intravenous Given 06/06/2020 1342)  furosemide (LASIX) injection 80 mg (80 mg Intravenous Given  05/24/2020 1342)  albuterol (VENTOLIN HFA) 108 (90 Base) MCG/ACT inhaler 2 puff (2 puffs Inhalation Given 06/08/2020 1344)    ED Course  I have reviewed the triage vital signs and the nursing notes.  Pertinent labs & imaging results that were available during my care of the patient were reviewed by me and considered in my medical decision making (see chart for details).  69 year old male presents for evaluation of shortness of breath.  Seen here in the ED 3 days ago for CHF exacerbation.  Given 160 IV Lasix as well as told to increase his home Lasix.  He has been doing such however still 6 or 7 pounds over his base weight.  He has significant lower extremity edema and some swelling into his abdomen however negative fluid wave.  He has got some expiratory wheeze right greater than left.  He is tachycardic into the 140s however afebrile.  We will hold on calling code sepsis given likely COPD versus CHF exacerbation.  As such we will give Lasix, steroids, breathing treatment and reassess.  Patient will likely need admission for acute hypoxic respiratory failure. Patient states up 6-7 pounds from baseline weight. Weighs  daily.  Labs and imaging personally reviewed and interpreted:  DG chest with pulmonary edema and small pleural effusion CBC without leukocytosis, hemoglobin stable BMP mild hyperglycemia at 223, creatinine 1.77 mildly up from 1.6 previously Lactic acid 0.8 Covid negative BNP 157 Trop 9 EKG without STEMI however no specific ST changes, sinus tachycardia.  Patient reassessed.  Feels mildly improved after Lasix, steroids and albuterol.  Has had 2 rounds of albuterol.  Sitting on edge of bed.  Becomes dyspneic with very little movement with HR into 130 and RR into 40's with movement..  Patient will need admission for acute hypoxic respiratory failure likely due to CHF exacerbation given he has failed outpatient treatment.  CONSULT with Dr. [name] with [facility] who agrees to evaluate patient for  admission.  Patient seen and evaluated by attending, Dr. Ashok Cordia who agrees above treatment, plan disposition  The patient appears reasonably stabilized for admission considering the current resources, flow, and capabilities available in the ED at this time, and I doubt any other [facility] requiring further screening and/or treatment in the ED prior to admission.    MDM Rules/Calculators/A&P                          Robert [name] was evaluated in Emergency Department on 05/29/2020 for the symptoms described in the history of present illness. He was evaluated in the context of the global COVID-19 pandemic, which necessitated consideration that the patient might be at risk for infection with the SARS-CoV-2 virus that causes COVID-19. Institutional protocols and algorithms that pertain to the evaluation of patients at risk for COVID-19 are in a state of rapid change based on information released by regulatory bodies including the CDC and federal and state organizations. These policies and algorithms were followed during the patient's care in the ED. Final Clinical Impression(s) / ED Diagnoses Final diagnoses:  Acute on chronic congestive heart failure, unspecified heart failure type ([place])  Acute respiratory failure with hypoxia [facility])    Rx / DC Orders ED Discharge Orders    None       Ameria Sanjurjo A, PA-C 06/05/2020 1618    Lajean Saver, MD 05/22/20 [phone]

## 2020-05-20 ENCOUNTER — Inpatient Hospital Stay (HOSPITAL_COMMUNITY): Payer: Medicare HMO

## 2020-05-20 DIAGNOSIS — J9621 Acute and chronic respiratory failure with hypoxia: Secondary | ICD-10-CM | POA: Diagnosis not present

## 2020-05-20 DIAGNOSIS — I130 Hypertensive heart and chronic kidney disease with heart failure and stage 1 through stage 4 chronic kidney disease, or unspecified chronic kidney disease: Secondary | ICD-10-CM | POA: Diagnosis not present

## 2020-05-20 DIAGNOSIS — I4891 Unspecified atrial fibrillation: Secondary | ICD-10-CM

## 2020-05-20 DIAGNOSIS — N170 Acute kidney failure with tubular necrosis: Secondary | ICD-10-CM | POA: Diagnosis not present

## 2020-05-20 DIAGNOSIS — I4892 Unspecified atrial flutter: Secondary | ICD-10-CM

## 2020-05-20 DIAGNOSIS — I5031 Acute diastolic (congestive) heart failure: Secondary | ICD-10-CM

## 2020-05-20 DIAGNOSIS — I5043 Acute on chronic combined systolic (congestive) and diastolic (congestive) heart failure: Secondary | ICD-10-CM | POA: Diagnosis not present

## 2020-05-20 LAB — GLUCOSE, CAPILLARY
Glucose-Capillary: 153 mg/dL — ABNORMAL HIGH (ref 70–99)
Glucose-Capillary: 227 mg/dL — ABNORMAL HIGH (ref 70–99)
Glucose-Capillary: 212 mg/dL — ABNORMAL HIGH (ref 70–99)
Glucose-Capillary: 271 mg/dL — ABNORMAL HIGH (ref 70–99)

## 2020-05-20 LAB — CBC WITH DIFFERENTIAL/PLATELET
Abs Immature Granulocytes: 0.03 10*3/uL (ref 0.00–0.07)
Basophils Absolute: 0 10*3/uL (ref 0.0–0.1)
Basophils Relative: 0 %
Eosinophils Absolute: 0 10*3/uL (ref 0.0–0.5)
Eosinophils Relative: 0 %
HCT: 43.3 % (ref 39.0–52.0)
Hemoglobin: 13.9 g/dL (ref 13.0–17.0)
Immature Granulocytes: 0 %
Lymphocytes Relative: 5 %
Lymphs Abs: 0.6 10*3/uL — ABNORMAL LOW (ref 0.7–4.0)
MCH: 31 pg (ref 26.0–34.0)
MCHC: 32.1 g/dL (ref 30.0–36.0)
MCV: 96.4 fL (ref 80.0–100.0)
Monocytes Absolute: 1.1 10*3/uL — ABNORMAL HIGH (ref 0.1–1.0)
Monocytes Relative: 10 %
Neutro Abs: 9 10*3/uL — ABNORMAL HIGH (ref 1.7–7.7)
Neutrophils Relative %: 85 %
Platelets: 188 10*3/uL (ref 150–400)
RBC: 4.49 MIL/uL (ref 4.22–5.81)
RDW: 14.5 % (ref 11.5–15.5)
WBC: 10.7 10*3/uL — ABNORMAL HIGH (ref 4.0–10.5)
nRBC: 0 % (ref 0.0–0.2)

## 2020-05-20 LAB — BASIC METABOLIC PANEL
Anion gap: 10 (ref 5–15)
BUN: 73 mg/dL — ABNORMAL HIGH (ref 8–23)
CO2: 24 mmol/L (ref 22–32)
Calcium: 8.8 mg/dL — ABNORMAL LOW (ref 8.9–10.3)
Chloride: 101 mmol/L (ref 98–111)
Creatinine, Ser: 1.71 mg/dL — ABNORMAL HIGH (ref 0.61–1.24)
GFR calc Af Amer: 47 mL/min — ABNORMAL LOW (ref >60)
GFR calc non Af Amer: 41 mL/min — ABNORMAL LOW (ref >60)
Glucose, Bld: 175 mg/dL — ABNORMAL HIGH (ref 70–99)
Potassium: 4.3 mmol/L (ref 3.5–5.1)
Sodium: 135 mmol/L (ref 135–145)

## 2020-05-20 LAB — ECHOCARDIOGRAM COMPLETE
Area-P 1/2: 4.46 cm2
Height: 72 in
S' Lateral: 4.21 cm
Weight: 3978.86 oz

## 2020-05-20 LAB — MRSA PCR SCREENING: MRSA by PCR: NEGATIVE

## 2020-05-20 LAB — HEMOGLOBIN A1C
Hgb A1c MFr Bld: 7.3 % — ABNORMAL HIGH (ref 4.8–5.6)
Mean Plasma Glucose: 162.81 mg/dL

## 2020-05-20 LAB — HIV ANTIBODY (ROUTINE TESTING W REFLEX): HIV Screen 4th Generation wRfx: NONREACTIVE

## 2020-05-20 MED ORDER — APIXABAN 5 MG PO TABS
5.0000 mg | ORAL_TABLET | Freq: Two times a day (BID) | ORAL | Status: DC
  Administered 2020-05-20 – 2020-05-24 (×9): 5 mg via ORAL
  Filled 2020-05-20 (×9): qty 1

## 2020-05-20 NOTE — Consult Note (Addendum)
Cardiology Consultation:   Patient ID: [name] MRN: [id number]; DOB: 08-12-1951  Admit date: 05/27/2020 Date of Consult: 05/20/2020  Primary Care Provider: Redmond School, MD [facility] Cardiologist: Sanda Klein, MD  [facility] Electrophysiologist:  None    Patient Profile:   Robert [name] is a 69 y.o. 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 Manuella Ghazi.  History of Present Illness:   [name] with above hx and recently seen by Dr. Sallyanne Kuster in April 2021.  He has CAD with multiple interventions and 08/2019 had CHF.  Normally NYHA functional class II.   CABG has been considered but it was felt that he would not benefit from procedure since the distal right coronary artery was a poor target, the left circumflex coronary artery territory is mostly infarcted and the LAD stenosis is not critical.  In addition he developed severe thrombocytopenia during that hospital stay (platelets down to 38,000), possibly due to the use of Aggrastat.  See below for multiple procedures.  Most recent cath 05/2017 with NSTEMI acute late thrombosis of the left circumflex stent, treated with angioplasty alone (Dr. Tamala Julian) Known chronic total occlusion of the proximal right coronary. The distal right coronary fills by collaterals from the LAD  The LAD is widely patent with 60% segmental proximal to mid stenosis. The first agonal contains 60% stenosis.  Recent event monitor 01/2020 with no a fib and Eliquis was stopped and on ASA 81 mg daily.  Echo 05/2019 with EF 50-55%, moderate basal septal hypertrophy.  Normal RV, mld to mod MR, LA mildly dilated. Hx mild carotid disease and PAD with Rt ABI 1.21 and left 0.61.   Pt presented 05/16/20 with SOB and wt gain, seen in ER and given IV lasix 80 mg total of 2 doses.  He improved per notes but not per  pt.and was discharged.   Since that time his wt has increased and he again developed SOB.  He is on 80 mg po lasix daily at home.   He began wearing his 02 all the time which helped but had to prop up to sleep and could not walk in house much due to SOB.    CXR with pulmonary edema.  sp02 in the 22s.  He was admitted for further management.    Echo pending.   No chest pain.    He has rec'd lasix 80 mg IV, two doses have been given and ordered every 12 hours   He is neg 850 cc no change in wt.    EKG:  The EKG was personally reviewed and demonstrates:  SR with 1st degree AV block , low voltage QRS, New T wave inversion V2, second EKG ST at 140 and non specific T abnormalities.  Today EKG SR vs Junctional tach rate at 90 and T wave inversion V2.  Telemetry:  Telemetry was personally reviewed and demonstrates:  SR with PVCs  Hs Troponin 9, 10  BNP 157 Na 135, K+ 4.3, BUN 73, Cr 1.71 WBC 10.7, Hgb 13.9, Hct 43.3 plts 188 hgb A1C 7.3  COVID neg.   Currently BP 118/58 P 115 afebrile and Resp 30 to 20 Afebrile.  Still with harsh prolonged cough, non productive.  Past Medical History:  Diagnosis Date  . Anxiety   . CHF (congestive heart failure) ([place])   .  Collagen vascular disease ([place])   . COPD (chronic obstructive pulmonary disease) ([place])   . Coronary artery disease   . COVID-19   . Full dentures   . GERD (gastroesophageal reflux disease)    Rolaids as needed  . High cholesterol   . History of MI (myocardial infarction)   . Hypertension    med. dosage increased 04/2016; has been on BP med. x 10 yrs.  . Incarcerated umbilical hernia [id number]  . Inguinal hernia 05/2016   bilateral   . Insulin dependent diabetes mellitus   . Ischemic cardiomyopathy    a. 03/2015 EF 40-45% by LV gram.  . Left leg cellulitis 10/08/2016  . Rheumatoid arthritis(714.0)     Past Surgical History:  Procedure Laterality Date  . CARDIAC CATHETERIZATION N/A 02/01/2015   Procedure: Left Heart Cath and Coronary  Angiography;  Surgeon: Belva Crome, MD;  Location: [place] CV [facility];  Service: Cardiovascular;  Laterality: N/A;  . CARDIAC CATHETERIZATION N/A 03/22/2015   Procedure: Left Heart Cath and Coronary Angiography;  Surgeon: Leonie Man, MD;  Location: [place] CV [facility];  Service: Cardiovascular;  Laterality: N/A;  . CARDIAC CATHETERIZATION  09/05/2002; 03/09/2003; 04/10/2005;06/02/2005; 05/09/2006; 02/09/2007; 03/01/2007  . COLONOSCOPY  2008   Dr. Gala Romney: diverticulosis, hyperplastic polyp.  . CORONARY ANGIOPLASTY  11/14/2012; 02/01/2015  . CORONARY ANGIOPLASTY WITH STENT PLACEMENT  05/16/2012   "1; makes total ~ 4"  . CORONARY STENT INTERVENTION N/A 06/06/2017   Procedure: CORONARY STENT INTERVENTION;  Surgeon: Belva Crome, MD;  Location: [place] [facility];  Service: Cardiovascular;  Laterality: N/A;  . INSERTION OF MESH Bilateral 05/24/2016   Procedure: INSERTION OF MESH;  Surgeon: Coralie Keens, MD;  Location: [place];  Service: General;  Laterality: Bilateral;  . LAPAROSCOPIC CHOLECYSTECTOMY    . LAPAROSCOPIC INGUINAL HERNIA WITH UMBILICAL HERNIA Bilateral 05/24/2016   Procedure: BILATERAL LAPAROSCOPIC INGUINAL HERNIA REPAIR WITH MESH AND UMBILICAL HERNIA REPAIR WITH MESH;  Surgeon: Coralie Keens, MD;  Location: [place];  Service: General;  Laterality: Bilateral;  . LEFT HEART CATH AND CORONARY ANGIOGRAPHY N/A 06/06/2017   Procedure: LEFT HEART CATH AND CORONARY ANGIOGRAPHY;  Surgeon: Belva Crome, MD;  Location: [place] [facility];  Service: Cardiovascular;  Laterality: N/A;  . LEFT HEART CATHETERIZATION WITH CORONARY ANGIOGRAM N/A 12/22/2011   Procedure: LEFT HEART CATHETERIZATION WITH CORONARY ANGIOGRAM;  Surgeon: Troy Sine, MD;  Location: [facility] CATH LAB;  Service: Cardiovascular;  Laterality: N/A;  . LEFT HEART CATHETERIZATION WITH CORONARY ANGIOGRAM Bilateral 05/16/2012   Procedure: LEFT HEART CATHETERIZATION WITH CORONARY ANGIOGRAM;  Surgeon:  Lorretta Harp, MD;  Location:  [facility] CATH LAB;  Service: Cardiovascular;  Laterality: Bilateral;  . LEFT HEART CATHETERIZATION WITH CORONARY ANGIOGRAM N/A 11/14/2012   Procedure: LEFT HEART CATHETERIZATION WITH CORONARY ANGIOGRAM;  Surgeon: Troy Sine, MD;  Location: [facility] CATH LAB;  Service: Cardiovascular;  Laterality: N/A;  . LEFT HEART CATHETERIZATION WITH CORONARY ANGIOGRAM N/A 09/01/2013   Procedure: LEFT HEART CATHETERIZATION WITH CORONARY ANGIOGRAM;  Surgeon: Leonie Man, MD;  Location: [facility] CATH LAB;  Service: Cardiovascular;  Laterality: N/A;  . [place]; 1973; 1985   x 3  . NM MYOCAR PERF WALL MOTION  08/30/2009   No significant ischemia  . OPEN REDUCTION INTERNAL FIXATION (ORIF) DISTAL RADIAL FRACTURE Right 12/10/2016   Procedure: OPEN REDUCTION INTERNAL FIXATION (ORIF) DISTAL RADIAL FRACTURE;  Surgeon: Iran Planas, MD;  Location: [place];  Service: Orthopedics;  Laterality: Right;  .  PERCUTANEOUS CORONARY INTERVENTION-BALLOON ONLY  12/22/2011   Procedure: PERCUTANEOUS CORONARY INTERVENTION-BALLOON ONLY;  Surgeon: Troy Sine, MD;  Location: [facility] CATH LAB;  Service: Cardiovascular;;  . PERCUTANEOUS CORONARY INTERVENTION-BALLOON ONLY  11/14/2012   Procedure: PERCUTANEOUS CORONARY INTERVENTION-BALLOON ONLY;  Surgeon: Troy Sine, MD;  Location: [facility] CATH LAB;  Service: Cardiovascular;;  . PERCUTANEOUS CORONARY STENT INTERVENTION (PCI-S)  05/16/2012   Procedure: PERCUTANEOUS CORONARY STENT INTERVENTION (PCI-S);  Surgeon: Lorretta Harp, MD;  Location: [facility] CATH LAB;  Service: Cardiovascular;;  . PERCUTANEOUS CORONARY STENT INTERVENTION (PCI-S)  09/01/2013   Procedure: PERCUTANEOUS CORONARY STENT INTERVENTION (PCI-S);  Surgeon: Leonie Man, MD;  Location: [facility] CATH LAB;  Service: Cardiovascular;;     Home Medications:  Prior to Admission medications   Medication Sig Start Date End Date Taking? Authorizing Provider  acarbose (PRECOSE) 100 MG tablet Take 1 tablet (100 mg  total) by mouth 3 (three) times daily with meals. 06/06/19  Yes Love, Ivan Anchors, PA-C  acetaminophen (TYLENOL) 650 MG CR tablet Take 650 mg by mouth every 8 (eight) hours as needed for pain.   Yes [provider]  aspirin EC 81 MG tablet Take 1 tablet (81 mg total) by mouth daily. 02/12/20  Yes Croitoru, Mihai, MD  atorvastatin (LIPITOR) 80 MG tablet Take 1 tablet (80 mg total) by mouth daily at 6 PM. 12/30/19  Yes Croitoru, Mihai, MD  clopidogrel (PLAVIX) 75 MG tablet Take 1 tablet (75 mg total) by mouth daily. 10/07/19  Yes Kroeger, Daleen Snook M., PA-C  fish oil-omega-3 fatty acids 1000 MG capsule Take 1 g by mouth daily.    Yes [provider]  fluticasone Asencion Islam) 50 MCG/ACT nasal spray  11/19/19  Yes [provider]  folic acid (FOLVITE) 1 MG tablet Take 1 tablet (1 mg total) by mouth daily. 06/06/19  Yes Love, Ivan Anchors, PA-C  furosemide (LASIX) 40 MG tablet Take 2 tablets (80 mg total) by mouth daily. 04/12/20  Yes Croitoru, Mihai, MD  gabapentin (NEURONTIN) 300 MG capsule Take 300 mg by mouth 3 (three) times daily.   Yes [provider]  glimepiride (AMARYL) 2 MG tablet Take 1 tablet (2 mg total) by mouth daily with breakfast. 06/06/19  Yes Love, Pamela S, PA-C  Insulin Glargine (TOUJEO SOLOSTAR [place]) Inject 25 Units into the skin at bedtime. 25 units    Yes [provider]  isosorbide mononitrate (IMDUR) 60 MG 24 hr tablet Take 1 tablet (60 mg total) by mouth daily. 02/12/20 02/06/21 Yes Croitoru, Mihai, MD  losartan (COZAAR) 25 MG tablet Take 0.5 tablets (12.5 mg total) by mouth daily. 09/29/19  Yes Kroeger, Daleen Snook M., PA-C  metoprolol tartrate (LOPRESSOR) 25 MG tablet Take 1 tablet (25 mg total) by mouth 2 (two) times daily. 09/29/19  Yes Kroeger, Daleen Snook M., PA-C  mometasone-formoterol (DULERA) 200-5 MCG/ACT AERO Inhale 2 puffs into the lungs 2 (two) times daily. 06/06/19  Yes Love, Ivan Anchors, PA-C  nitroGLYCERIN (NITROSTAT) 0.4 MG SL tablet Place 1 tablet (0.4 mg  total) under the tongue every 5 (five) minutes x 3 doses as needed for chest pain. 04/24/16  Yes Croitoru, Mihai, MD  potassium chloride SA (KLOR-CON) 20 MEQ tablet Take 1 tablet (20 mEq total) by mouth 2 (two) times daily. 02/03/20  Yes Kroeger, Daleen Snook M., PA-C  primidone (MYSOLINE) 50 MG tablet Take 100 mg by mouth at bedtime. 09/18/19  Yes [provider]  ALPRAZolam Duanne Moron) 0.5 MG tablet Take 1 tablet (0.5 mg total) by mouth 3 (three)  times daily as needed for anxiety or sleep. Patient not taking: Reported on 05/27/2020 06/06/19   Love, Ivan Anchors, PA-C  collagenase (SANTYL) ointment Apply topically daily. And cover with damp to dry dressing daily. Change more frequently if soiled 06/06/19   Love, Ivan Anchors, PA-C  terbinafine (LAMISIL) 250 MG tablet Take 250 mg by mouth daily. Patient not taking: Reported on 05/13/2020 11/26/19   [provider]  vitamin C (VITAMIN C) 250 MG tablet Take 1 tablet (250 mg total) by mouth 2 (two) times daily. Patient not taking: Reported on 05/18/2020 06/06/19   Bary Leriche, PA-C    Inpatient Medications: Scheduled Meds: . aspirin EC  81 mg Oral Daily  . atorvastatin  80 mg Oral q1800  . clopidogrel  75 mg Oral Daily  . fluticasone  1 spray Each Nare Daily  . folic acid  1 mg Oral Daily  . furosemide  80 mg Intravenous Q12H  . gabapentin  300 mg Oral TID  . heparin  5,000 Units Subcutaneous Q8H  . insulin aspart  0-20 Units Subcutaneous TID WC  . insulin aspart  0-5 Units Subcutaneous QHS  . insulin glargine  25 Units Subcutaneous QHS  . isosorbide mononitrate  60 mg Oral Daily  . metoprolol tartrate  25 mg Oral BID  . mometasone-formoterol  2 puff Inhalation BID  . primidone  100 mg Oral QHS  . sodium chloride flush  3 mL Intravenous Q12H   Continuous Infusions: . sodium chloride     PRN Meds: sodium chloride, acetaminophen, ipratropium-albuterol, ondansetron (ZOFRAN) IV, sodium chloride flush  Allergies:    Allergies  Allergen Reactions   . Ivp Dye [Iodinated Diagnostic Agents] Itching and Rash    Social History:   Social History   Socioeconomic History  . Marital status: Widowed    Spouse name: Not on file  . Number of children: Not on file  . Years of education: Not on file  . Highest education level: Not on file  Occupational History  . Occupation: Clinical biochemist  Tobacco Use  . Smoking status: Current Some Day Smoker    Packs/day: 1.00    Types: Cigarettes    Last attempt to quit: 06/11/2015    Years since quitting: 4.9  . Smokeless tobacco: Never Used  . Tobacco comment: pt states smoking less than half a pack  Vaping Use  . Vaping Use: Never used  Substance and Sexual Activity  . Alcohol use: No  . Drug use: No  . Sexual activity: Yes  Other Topics Concern  . Not on file  Social History Narrative  . Not on file   Social Determinants of Health   Financial Resource Strain:   . Difficulty of Paying Living Expenses: Not on file  Food Insecurity: No Food Insecurity  . Worried About Charity fundraiser in the Last Year: Never true  . Ran Out of Food in the Last Year: Never true  Transportation Needs: No Transportation Needs  . Lack of Transportation (Medical): No  . Lack of Transportation (Non-Medical): No  Physical Activity:   . Days of Exercise per Week: Not on file  . Minutes of Exercise per Session: Not on file  Stress: No Stress Concern Present  . Feeling of Stress : Not at all  Social Connections:   . Frequency of Communication with Friends and Family: Not on file  . Frequency of Social Gatherings with Friends and Family: Not on file  . Attends Religious Services:  Not on file  . Active Member of Clubs or Organizations: Not on file  . Attends Archivist Meetings: Not on file  . Marital Status: Not on file  Intimate Partner Violence:   . Fear of Current or Ex-Partner: Not on file  . Emotionally Abused: Not on file  . Physically Abused: Not on file  . Sexually Abused:  Not on file    Family History:    Family History  Problem Relation Age of Onset  . Heart disease Father   . Colon cancer Brother        early 26s  . Hypertension Mother   . Heart disease Mother   . Colon cancer Mother        died age 17. late 38s early 25s at diagnosis.   . Lung cancer Mother      ROS:  Please see the history of present illness.  General:no colds or fevers, + weight gain Skin:no rashes or ulcers HEENT:no blurred vision, no congestion CV:see HPI PUL:see HPI GI:no diarrhea constipation or melena, no indigestion GU:no hematuria, no dysuria MS:no joint pain, no claudication Neuro:no syncope, no lightheadedness Endo:+ diabetes, no thyroid disease  All other ROS reviewed and negative.     Physical Exam/Data:   Vitals:   06/03/2020 1953 05/18/2020 2038 05/20/20 0500 05/20/20 0748  BP: (!) 118/58     Pulse: (!) 55     Resp: 20     Temp: (!) 97.5 F (36.4 C)     TempSrc:      SpO2: 95% 94%  94%  Weight: 112.3 kg  112.8 kg   Height: 6' (1.829 m)       Intake/Output Summary (Last 24 hours) at 05/20/2020 0825 Last data filed at 05/18/2020 2252 Gross per 24 hour  Intake --  Output 850 ml  Net -850 ml   Last 3 Weights 05/20/2020 05/20/2020 06/08/2020  Weight (lbs) 248 lb 10.9 oz 247 lb 9.2 oz 239 lb  Weight (kg) 112.8 kg 112.3 kg 108.41 kg     Body mass index is 33.73 kg/m.  General:  Well nourished, well developed, in no acute distress until coughs HEENT: normal Lymph: no adenopathy Neck: no JVD sitting up in bed Endocrine:  No thryomegaly Vascular: No carotid bruits; FA pulses 1+ bilaterally but difficult to palpate with edema  Cardiac:  normal S1, S2; RRR; no murmur gallup rub or click Lungs:  diminished to auscultation bilaterally, some wheezing, + rhonchi no rales  Abd: soft, nontender, no hepatomegaly though with tight from CHF Ext: 2-3+ lower ext edema to knees, was higher Musculoskeletal:  No deformities, BUE and BLE strength normal and equal Skin:  warm and dry  Neuro:  Alert and oriented X 3 MAE follows commands, no focal abnormalities noted Psych:  Normal affect    Relevant CV Studies: summary of coronary interventions: -cath 2004 showed total occlusion of RCA and intermediate lesions LAD, high grade mid LCX treated with 4x18 mm BMS (Velocity).  - 2006 overlapping 3.5x23 mm Cypher inLCX  - 2007 new downstream stenosis at OM bifurcation treated with 3x18 mm Cypher stent  - 2008 acute stent thrombosis (allergic to plavix, temporarily stopped Ticlid) - thrombolysis/aspiration only  - April 2013 cutting balloon angioplasty for proximal LCX in stent restenosis (Dr. Claiborne Billings)  - September 2013 repeat restenosis treated with "stent in stent" Xience Expedition 3.25 x 23 mm (Dr. Gwenlyn Found).  - March 2014 left circumflex coronary artery PCI for restenosis, utilizing cutting balloon atherotomy/noncompliant balloon  dilatation  - December 2014 Thrombotic occlusion of restenotic proximal left circumflex stent , new Promus Premier DES 3.5 mm x 24 mm; overlapping the proximal and distal stents - May 2016 cutting balloon angioplasty for in-stent restenosis in the proximal left circumflex coronary artery - July 2016 non STEMI , coronary angiogram unchanged,no intervention performed -  September 2018 non-STEMI, acute late thrombosis of the left circumflex stent, treated with angioplasty alone (Dr. Tamala Julian) - No change in RCA or LAD anatomy during last several years.  - Allergic to iodinated contrast, but did well with premedication. At one time Allergic to clopidogrel but has tolerated for some time now.  Has tolerated Brilinta and Effient well.  last cath 2018 Diagnostic Dominance: Right  Intervention    Laboratory Data:  High Sensitivity Troponin:   Recent Labs  Lab 05/16/20 1228 05/16/20 1436 05/17/2020 1310 06/04/2020 1653  TROPONINIHS 10 10 9 10      Chemistry Recent Labs  Lab 05/16/20 1228 05/25/2020 1310 05/20/20 0629  NA 135  136 135  K 4.4 4.4 4.3  CL 104 103 101  CO2 24 23 24   GLUCOSE 185* 223* 175*  BUN 49* 69* 73*  CREATININE 1.63* 1.77* 1.71*  CALCIUM 8.3* 8.4* 8.8*  GFRNONAA 43* 39* 41*  GFRAA 50* 45* 47*  ANIONGAP 7 10 10     Recent Labs  Lab 05/16/20 1228  PROT 6.4*  ALBUMIN 3.2*  AST 30  ALT 57*  ALKPHOS 97  BILITOT 0.7   Hematology Recent Labs  Lab 05/16/20 1228 06/05/2020 1310 05/20/20 0629  WBC 8.2 9.3 10.7*  RBC 4.59 4.58 4.49  HGB 14.4 14.5 13.9  HCT 44.9 44.7 43.3  MCV 97.8 97.6 96.4  MCH 31.4 31.7 31.0  MCHC 32.1 32.4 32.1  RDW 15.0 14.7 14.5  PLT 160 182 188   BNP Recent Labs  Lab 05/16/20 1228 05/18/2020 1310  BNP 149.0* 157.0*    DDimer No results for input(s): DDIMER in the last 168 hours.   Radiology/Studies:  DG Chest Portable 1 View  Result Date: 05/20/2020 CLINICAL DATA:  Chest x-ray 05/16/2020, chest x-ray 07/03/2019 EXAM: PORTABLE CHEST 1 VIEW COMPARISON:  None. FINDINGS: The heart size and mediastinal contours are unchanged with an enlarged cardiac silhouette that may be partially due to a portable AP technique. Aortic arch and descending thoracic aortic calcifications. Vascular hilar fullness. Persistent bilateral perihilar and basilar patchy airspace opacities. Persistent trace left pleural effusion. Nonspecific blunting of the right costophrenic angle. No pneumothorax bilaterally. The visualized skeletal structures are unremarkable. Suggestion of a coronary stent. IMPRESSION: Similar appearing pulmonary edema with suggestion of a left small pleural effusion. Electronically Signed   By: Iven Finn M.D.   On: 05/21/2020 13:16   DG Chest Port 1 View  Result Date: 05/16/2020 CLINICAL DATA:  Shortness of breath. EXAM: PORTABLE CHEST 1 VIEW COMPARISON:  July 03, 2019. FINDINGS: Stable cardiomegaly with central pulmonary vascular congestion. No pneumothorax is noted. Stable small left pleural effusion or pleural thickening is noted. Increased perihilar and  basilar interstitial densities are noted concerning for edema. Bony thorax is unremarkable. IMPRESSION: Stable cardiomegaly with central pulmonary vascular congestion. Increased bilateral perihilar and basilar interstitial densities are noted concerning for edema. Aortic Atherosclerosis (ICD10-I70.0). Electronically Signed   By: Marijo Conception M.D.   On: 05/16/2020 12:34        No chest pain New York Heart Association (NYHA) Functional Class NYHA Class IV  Assessment and Plan:   1. Acute diastolic HF, echo  pending prior  and no angina and neg troponin, though some EKG changes.  On lasix 80 IV BID and neg 850, still with cough and SOB, may need to increase to 80 TID, will defer to Dr. Domenic Polite consider torsemide at discharge.  2. CAD with multiple interventions on LCX and known occluded RCA, non obstructive disease in LAD on last cath 2018.   Referred for CABG but was not a good candidate see above.   No angina. 3. PAF, with acute COVID in 03/2019 with vent dependence at that time.  Recent monitor with no a fib and anticoagulation was stopped in May.   4. HLD on lipitor 80 lipids 1 year ago with LDL 44 and HDL 28. Continue statin 5. COPD per IM 6. DM-2 insulin dependent per IM      For questions or updates, please contact Day Please consult [email or web address] for contact info under    Signed, Cecilie Kicks, NP  05/20/2020 8:25 AM    Attending note:  Patient seen and examined.  I reviewed his records and discussed the case with Ms. Ingold NP.  Mr. Brizzi is a patient of Dr. Sallyanne Kuster with known multivessel CAD being managed medically as per discussion above, paroxysmal to persistent atrial fibrillation, COPD, hyperlipidemia, and type 2 diabetes mellitus.  He is currently admitted reporting weight gain somewhere between 10 and 15 pounds over a period of several weeks, worsening shortness of breath, abdominal protuberance, and leg swelling.  He reports compliance with his diuretics as an  outpatient.  No reported angina symptoms were sense of palpitations.  On examination he is in no distress.  He is afebrile, heart rate in the 55V, systolic blood pressure 748O.  Lungs exhibit decreased breath sounds particularly at the bases, cardiac exam with somewhat irregular rhythm, indistinct PMI and soft systolic murmur.  Abdomen is protuberant, there is 2-3+ lower extremity edema and venous stasis.  Pertinent lab work includes creatinine 1.71, potassium 4.3, hemoglobin 13.9, high-sensitivity troponin I of 9 and 10, BNP 157, hemoglobin A1c 7.3%.  Chest x-ray reports left pleural effusion and pulmonary edema.  I personally reviewed his recent ECGs which are consistent with coarse atrial fibrillation versus atypical atrial flutter with variable conduction, not sinus rhythm.  Last ECG in sinus rhythm was back in April.  Duration of arrhythmia is uncertain.  Last echocardiogram in September 2020 revealed LVEF 50 to 55% with restrictive diastolic filling pattern and normal RV contraction.  Follow-up studies pending.  Patient presents with presumed acute diastolic heart failure, likely complicated by recurrent atrial fibrillation/flutter as evident by his ECG.  Obtain follow-up echocardiogram.  With CHA2DS2-VASc score of 5, switch back from aspirin to Eliquis.  May need to actually consider cardioversion once volume status is better stabilized.  Continue IV Lasix, agree that conversion to Demadex may be appropriate as an outpatient.  No evidence of ACS or clear need for further ischemic work-up at this time.  Satira Sark, M.D., F.A.C.C.

## 2020-05-20 NOTE — Progress Notes (Signed)
PROGRESS NOTE    LEORY ALLINSON  MCN:[id number] DOB: [birth year] DOA: 05/13/2020 PCP: Redmond School, MD   Brief Narrative:  Per HPI: Robert [name] is a 69 y.o. 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 He usually takes about 80 mg of Lasix once a day.  He has been compliant with his medication.  He presented to the emergency department on September 5.  He was given 2 doses of IV Lasix 80 mg each.  He felt better.  He was discharged home.  However he has gained a total of about 8 pounds in the last week or so.  He started developing shortness of breath in the last 24 hours and so he decided to come back to the hospital.  Denies any chest pain dizziness lightheadedness.  No syncopal episodes.  No fever or chills.  No nausea vomiting.  has also noticed some swelling in his legs although that has not changed much in the last several days.  Has history of orthopnea but no PND.  In the emergency department chest x-ray shows pulmonary edema.  He was noted to be hypoxic saturating in the late 80s.  He has oxygen at home but does not use it on a regular basis.  Due to refractory symptoms he will need hospitalization for further management.  9/9: Patient admitted with acute diastolic heart failure. Seen by cardiology with plans to continue IV Lasix diuresis. Approximate 10-15 pound weight gain noted recently. Patient to be started on Eliquis.  Assessment & Plan:   Principal Problem:   Acute CHF (congestive heart failure) (HCC) Active Problems:   COPD (chronic obstructive pulmonary disease) (HCC)   Controlled type 2 diabetes mellitus with diabetic peripheral angiopathy without gangrene, with long-term  current use of insulin (HCC)   Acute respiratory failure with hypoxemia (HCC)   CKD (chronic kidney disease), stage III  Acute on chronic hypoxemic respiratory failure secondary to acute diastolic CHF exacerbation -Continue IV Lasix 80 mg twice daily as ordered -Monitor intake and output -Wean oxygen down to 2 L baseline as tolerated -Appreciate cardiology evaluation and input  History of CAD -Multiple prior interventions with last cath 2018 -Not a good candidate for CABG -Continue aspirin and Plavix -Continue beta-blocker and statin  Insulin-dependent diabetes with peripheral neuropathy -Continue SSI as well as Lantus -Hemoglobin A1c ordered and pending  History of COPD -DuoNebs as needed -No acute bronchospasms noted  Prior history of paroxysmal atrial fibrillation -Per cardiology, plan to discontinue aspirin and start Eliquis -CHA2DS2-VASc score 5 -DC heparin  History of peripheral artery disease -Outpatient management  Essential hypertension -Monitor carefully with ongoing diuresis   DVT prophylaxis:Heparin to Eliquis Code Status: Full Family Communication: None at bedside Disposition Plan:  Status is: Inpatient  Remains inpatient appropriate because:IV treatments appropriate due to intensity of illness or inability to take PO and Inpatient level of care appropriate due to severity of illness   Dispo: The patient is from: Home              Anticipated d/c is to: Home              Anticipated d/c date is: 2 days  Patient currently is not medically stable to d/c. Patient requires ongoing IV Lasix diuresis.  Consultants:   Cardiology  Procedures:   See below  Antimicrobials:   None   Subjective: Patient seen and evaluated today with no new acute complaints or concerns. No acute concerns or events noted overnight. He states that his breathing is slowly improving. Continues to have persistent lower extremity edema.  Objective: Vitals:    05/16/2020 1730 06/04/2020 1953 05/17/2020 2038 05/20/20 0500  BP: 122/76 (!) 118/58    Pulse: (!) 107 (!) 55    Resp: (!) 30 20    Temp:  (!) 97.5 F (36.4 C)    TempSrc:      SpO2: 96% 95% 94%   Weight:  112.3 kg  112.8 kg  Height:  6' (1.829 m)      Intake/Output Summary (Last 24 hours) at 05/20/2020 0703 Last data filed at 06/05/2020 2252 Gross per 24 hour  Intake --  Output 850 ml  Net -850 ml   Filed Weights   05/23/2020 1240 05/30/2020 1953 05/20/20 0500  Weight: 108.4 kg 112.3 kg 112.8 kg    Examination:  General exam: Appears calm and comfortable  Respiratory system: Clear to auscultation. Respiratory effort normal. Currently on 4 L nasal cannula oxygen. Cardiovascular system: S1 & S2 heard, RRR. Bilateral lower extremity edema noted. Gastrointestinal system: Abdomen is nondistended, soft and nontender.  Central nervous system: Alert and oriented. No focal neurological deficits. Extremities: Symmetric 5 x 5 power. Skin: No rashes, lesions or ulcers Psychiatry: Judgement and insight appear normal. Mood & affect appropriate.     Data Reviewed: I have personally reviewed following labs and imaging studies  CBC: Recent Labs  Lab 05/16/20 1228 05/27/2020 1310 05/20/20 0629  WBC 8.2 9.3 10.7*  NEUTROABS 6.7 7.9* 9.0*  HGB 14.4 14.5 13.9  HCT 44.9 44.7 43.3  MCV 97.8 97.6 96.4  PLT 160 182 102   Basic Metabolic Panel: Recent Labs  Lab 05/16/20 1228 06/02/2020 1310  NA 135 136  K 4.4 4.4  CL 104 103  CO2 24 23  GLUCOSE 185* 223*  BUN 49* 69*  CREATININE 1.63* 1.77*  CALCIUM 8.3* 8.4*   GFR: Estimated Creatinine Clearance: 52.5 mL/min (A) (by C-G formula based on SCr of 1.77 mg/dL (H)). Liver Function Tests: Recent Labs  Lab 05/16/20 1228  AST 30  ALT 57*  ALKPHOS 97  BILITOT 0.7  PROT 6.4*  ALBUMIN 3.2*   No results for input(s): LIPASE, AMYLASE in the last 168 hours. No results for input(s): AMMONIA in the last 168 hours. Coagulation Profile: No  results for input(s): INR, PROTIME in the last 168 hours. Cardiac Enzymes: No results for input(s): CKTOTAL, CKMB, CKMBINDEX, TROPONINI in the last 168 hours. BNP (last 3 results) No results for input(s): PROBNP in the last 8760 hours. HbA1C: No results for input(s): HGBA1C in the last 72 hours. CBG: Recent Labs  Lab 05/18/2020 1733 05/20/2020 2042  GLUCAP 335* 421*   Lipid Profile: No results for input(s): CHOL, HDL, LDLCALC, TRIG, CHOLHDL, LDLDIRECT in the last 72 hours. Thyroid Function Tests: No results for input(s): TSH, T4TOTAL, FREET4, T3FREE, THYROIDAB in the last 72 hours. Anemia Panel: No results for input(s): VITAMINB12, FOLATE, FERRITIN, TIBC, IRON, RETICCTPCT in the last 72 hours. Sepsis Labs: Recent Labs  Lab 06/07/2020 1342  LATICACIDVEN 0.8    Recent Results (from the past 240 hour(s))  SARS Coronavirus 2 by RT PCR (hospital order, performed in [facility] hospital lab)  Nasopharyngeal Nasopharyngeal Swab     Status: None   Collection Time: 05/16/20  3:23 PM   Specimen: Nasopharyngeal Swab  Result Value Ref Range Status   SARS Coronavirus 2 NEGATIVE NEGATIVE Final    Comment: (NOTE) SARS-CoV-2 target nucleic acids are NOT DETECTED.  The SARS-CoV-2 RNA is generally detectable in upper and lower respiratory specimens during the acute phase of infection. The lowest concentration of SARS-CoV-2 viral copies this assay can detect is 250 copies / mL. A negative result does not preclude SARS-CoV-2 infection and should not be used as the sole basis for treatment or other patient management decisions.  A negative result may occur with improper specimen collection / handling, submission of specimen other than nasopharyngeal swab, presence of viral mutation(s) within the areas targeted by this assay, and inadequate number of viral copies (<250 copies / mL). A negative result must be combined with clinical observations, patient history, and epidemiological  information.  Fact Sheet for Patients:   StrictlyIdeas.no  Fact Sheet for Healthcare Providers: BankingDealers.co.za  This test is not yet approved or  cleared by the [place] FDA and has been authorized for detection and/or diagnosis of SARS-CoV-2 by FDA under an Emergency Use Authorization (EUA).  This EUA will remain in effect (meaning this test can be used) for the duration of the COVID-19 declaration under Section 564(b)(1) of the Act, 21 U.S.C. section 360bbb-3(b)(1), unless the authorization is terminated or revoked sooner.  Performed at [facility], [address] [place], [place] [address]   SARS Coronavirus 2 by RT PCR (hospital order, performed in [facility] hospital lab) Nasopharyngeal Nasopharyngeal Swab     Status: None   Collection Time: 05/18/2020  1:20 PM   Specimen: Nasopharyngeal Swab  Result Value Ref Range Status   SARS Coronavirus 2 NEGATIVE NEGATIVE Final    Comment: (NOTE) SARS-CoV-2 target nucleic acids are NOT DETECTED.  The SARS-CoV-2 RNA is generally detectable in upper and lower respiratory specimens during the acute phase of infection. The lowest concentration of SARS-CoV-2 viral copies this assay can detect is 250 copies / mL. A negative result does not preclude SARS-CoV-2 infection and should not be used as the sole basis for treatment or other patient management decisions.  A negative result may occur with improper specimen collection / handling, submission of specimen other than nasopharyngeal swab, presence of viral mutation(s) within the areas targeted by this assay, and inadequate number of viral copies (<250 copies / mL). A negative result must be combined with clinical observations, patient history, and epidemiological information.  Fact Sheet for Patients:   StrictlyIdeas.no  Fact Sheet for Healthcare Providers: BankingDealers.co.za  This  test is not yet approved or  cleared by the [place] FDA and has been authorized for detection and/or diagnosis of SARS-CoV-2 by FDA under an Emergency Use Authorization (EUA).  This EUA will remain in effect (meaning this test can be used) for the duration of the COVID-19 declaration under Section 564(b)(1) of the Act, 21 U.S.C. section 360bbb-3(b)(1), unless the authorization is terminated or revoked sooner.  Performed at [facility], [address], [place], [place] [address]   Blood culture (routine x 2)     Status: None (Preliminary result)   Collection Time: 05/14/2020  1:42 PM   Specimen: BLOOD LEFT HAND  Result Value Ref Range Status   Specimen Description   Final    BLOOD LEFT HAND BOTTLES DRAWN AEROBIC AND ANAEROBIC   Special Requests   Final    Blood  Culture adequate volume Performed at [facility], [address] [place], [place] [address]    Culture PENDING  Incomplete   Report Status PENDING  Incomplete  Blood culture (routine x 2)     Status: None (Preliminary result)   Collection Time: 06/03/2020  1:43 PM   Specimen: Left Antecubital; Blood  Result Value Ref Range Status   Specimen Description   Final    LEFT ANTECUBITAL BOTTLES DRAWN AEROBIC AND ANAEROBIC   Special Requests   Final    Blood Culture adequate volume Performed at [facility], [address], [place],  [address]    Culture PENDING  Incomplete   Report Status PENDING  Incomplete         Radiology Studies: DG Chest Portable 1 View  Result Date: 06/03/2020 CLINICAL DATA:  Chest x-ray 05/16/2020, chest x-ray 07/03/2019 EXAM: PORTABLE CHEST 1 VIEW COMPARISON:  None. FINDINGS: The heart size and mediastinal contours are unchanged with an enlarged cardiac silhouette that may be partially due to a portable AP technique. Aortic arch and descending thoracic aortic calcifications. Vascular hilar fullness. Persistent bilateral perihilar and basilar patchy airspace opacities. Persistent trace left  pleural effusion. Nonspecific blunting of the right costophrenic angle. No pneumothorax bilaterally. The visualized skeletal structures are unremarkable. Suggestion of a coronary stent. IMPRESSION: Similar appearing pulmonary edema with suggestion of a left small pleural effusion. Electronically Signed   By: Iven Finn M.D.   On: 06/10/2020 13:16        Scheduled Meds: . aspirin EC  81 mg Oral Daily  . atorvastatin  80 mg Oral q1800  . clopidogrel  75 mg Oral Daily  . fluticasone  1 spray Each Nare Daily  . folic acid  1 mg Oral Daily  . furosemide  80 mg Intravenous Q12H  . gabapentin  300 mg Oral TID  . heparin  5,000 Units Subcutaneous Q8H  . insulin aspart  0-20 Units Subcutaneous TID WC  . insulin aspart  0-5 Units Subcutaneous QHS  . insulin glargine  25 Units Subcutaneous QHS  . isosorbide mononitrate  60 mg Oral Daily  . metoprolol tartrate  25 mg Oral BID  . mometasone-formoterol  2 puff Inhalation BID  . primidone  100 mg Oral QHS  . sodium chloride flush  3 mL Intravenous Q12H   Continuous Infusions: . sodium chloride       LOS: 1 day    Time spent: 35 minutes    Wajiha Versteeg Darleen Crocker, DO [facility]  If 7PM-7AM, please contact night-coverage [email or web address] 05/20/2020, 7:03 AM

## 2020-05-20 NOTE — Progress Notes (Deleted)
Cardiology Clinic Note   Patient Name: Robert [name] Date of Encounter: 05/20/2020  Primary Care Provider:  Redmond School, MD Primary Cardiologist:  Sanda Klein, MD  Patient Profile    Robert [name] 69 year old male presents to the clinic today for follow-up evaluation of his shortness of breath and CHF.  Past Medical History    Past Medical History:  Diagnosis Date  . Anxiety   . CHF (congestive heart failure) ([place])   . Collagen vascular disease ([place])   . COPD (chronic obstructive pulmonary disease) ([place])   . Coronary artery disease   . COVID-19   . Full dentures   . GERD (gastroesophageal reflux disease)    Rolaids as needed  . High cholesterol   . History of MI (myocardial infarction)   . Hypertension    med. dosage increased 04/2016; has been on BP med. x 10 yrs.  . Incarcerated umbilical hernia 02/2693  . Inguinal hernia 05/2016   bilateral   . Insulin dependent diabetes mellitus   . Ischemic cardiomyopathy    a. 03/2015 EF 40-45% by LV gram.  . Left leg cellulitis 10/08/2016  . Rheumatoid arthritis(714.0)    Past Surgical History:  Procedure Laterality Date  . CARDIAC CATHETERIZATION N/A 02/01/2015   Procedure: Left Heart Cath and Coronary Angiography;  Surgeon: Belva Crome, MD;  Location: [place] [facility];  Service: Cardiovascular;  Laterality: N/A;  . CARDIAC CATHETERIZATION N/A 03/22/2015   Procedure: Left Heart Cath and Coronary Angiography;  Surgeon: Leonie Man, MD;  Location: [place] [facility];  Service: Cardiovascular;  Laterality: N/A;  . CARDIAC CATHETERIZATION  09/05/2002; 03/09/2003; 04/10/2005;06/02/2005; 05/09/2006; 02/09/2007; 03/01/2007  . COLONOSCOPY  2008   Dr. Gala Romney: diverticulosis, hyperplastic polyp.  . CORONARY ANGIOPLASTY  11/14/2012; 02/01/2015  . CORONARY ANGIOPLASTY WITH STENT PLACEMENT  05/16/2012   "1; makes total ~ 4"  . CORONARY STENT INTERVENTION N/A 06/06/2017   Procedure: CORONARY STENT INTERVENTION;  Surgeon: Belva Crome, MD;  Location: [place] [facility];  Service: Cardiovascular;  Laterality: N/A;  . INSERTION OF MESH Bilateral 05/24/2016   Procedure: INSERTION OF MESH;  Surgeon: Coralie Keens, MD;  Location: [facility];  Service: General;  Laterality: Bilateral;  . LAPAROSCOPIC CHOLECYSTECTOMY    . LAPAROSCOPIC INGUINAL HERNIA WITH UMBILICAL HERNIA Bilateral 05/24/2016   Procedure: BILATERAL LAPAROSCOPIC INGUINAL HERNIA REPAIR WITH MESH AND UMBILICAL HERNIA REPAIR WITH MESH;  Surgeon: Coralie Keens, MD;  Location: [facility];  Service: General;  Laterality: Bilateral;  . LEFT HEART CATH AND CORONARY ANGIOGRAPHY N/A 06/06/2017   Procedure: LEFT HEART CATH AND CORONARY ANGIOGRAPHY;  Surgeon: Belva Crome, MD;  Location: [place] [facility];  Service: Cardiovascular;  Laterality: N/A;  . LEFT HEART CATHETERIZATION WITH CORONARY ANGIOGRAM N/A 12/22/2011   Procedure: LEFT HEART CATHETERIZATION WITH CORONARY ANGIOGRAM;  Surgeon: Troy Sine, MD;  Location: [facility] CATH LAB;  Service: Cardiovascular;  Laterality: N/A;  . LEFT HEART CATHETERIZATION WITH CORONARY ANGIOGRAM Bilateral 05/16/2012   Procedure: LEFT HEART CATHETERIZATION WITH CORONARY ANGIOGRAM;  Surgeon: Lorretta Harp, MD;  Location: [facility] CATH LAB;  Service: Cardiovascular;  Laterality: Bilateral;  . LEFT HEART CATHETERIZATION WITH CORONARY ANGIOGRAM N/A 11/14/2012   Procedure: LEFT HEART CATHETERIZATION WITH CORONARY ANGIOGRAM;  Surgeon: Troy Sine, MD;  Location[facility] C[facility] LAB;  Service: Cardiovascular;  Laterality: N/A;  . LEFT HEART CATHETERIZATION WITH CORONARY ANGIOGRAM N/A 09/01/2013   Procedure: LEFT HEART CATHETERIZATION WITH CORONARY ANGIOGRAM;  Surgeon: Shanon Brow  Loren Racer, MD;  Location: [facility] CATH LAB;  Service: Cardiovascular;  Laterality: N/A;  . [place]; 1973; 1985   x 3  . NM MYOCAR PERF WALL MOTION  08/30/2009   No significant ischemia  . OPEN REDUCTION INTERNAL FIXATION (ORIF) DISTAL RADIAL  FRACTURE Right 12/10/2016   Procedure: OPEN REDUCTION INTERNAL FIXATION (ORIF) DISTAL RADIAL FRACTURE;  Surgeon: Iran Planas, MD;  Location: [place];  Service: Orthopedics;  Laterality: Right;  . PERCUTANEOUS CORONARY INTERVENTION-BALLOON ONLY  12/22/2011   Procedure: PERCUTANEOUS CORONARY INTERVENTION-BALLOON ONLY;  Surgeon: Troy Sine, MD;  Location: [facility] CATH LAB;  Service: Cardiovascular;;  . PERCUTANEOUS CORONARY INTERVENTION-BALLOON ONLY  11/14/2012   Procedure: PERCUTANEOUS CORONARY INTERVENTION-BALLOON ONLY;  Surgeon: Troy Sine, MD;  Location: [facility] CATH LAB;  Service: Cardiovascular;;  . PERCUTANEOUS CORONARY STENT INTERVENTION (PCI-S)  05/16/2012   Procedure: PERCUTANEOUS CORONARY STENT INTERVENTION (PCI-S);  Surgeon: Lorretta Harp, MD;  Location: [facility] CATH LAB;  Service: Cardiovascular;;  . PERCUTANEOUS CORONARY STENT INTERVENTION (PCI-S)  09/01/2013   Procedure: PERCUTANEOUS CORONARY STENT INTERVENTION (PCI-S);  Surgeon: Leonie Man, MD;  Location: [facility] CATH LAB;  Service: Cardiovascular;;    Allergies  Allergies  Allergen Reactions  . Ivp Dye [Iodinated Diagnostic Agents] Itching and Rash    History of Present Illness    Echocardiogram 05/20/2020 showed LVEF 35% in the setting of atrial fibrillation/flutter with controlled ventricular response.  Moderate to large circumferential pericardial effusion noted.  Diuresis continued and blood pressure closely monitored.  Currently admitted.  Home Medications    Prior to Admission medications   Medication Sig Start Date End Date Taking? Authorizing Provider  acarbose (PRECOSE) 100 MG tablet Take 1 tablet (100 mg total) by mouth 3 (three) times daily with meals. 06/06/19   Love, Ivan Anchors, PA-C  acetaminophen (TYLENOL) 650 MG CR tablet Take 650 mg by mouth every 8 (eight) hours as needed for pain.    [provider]  ALPRAZolam Duanne Moron) 0.5 MG tablet Take 1 tablet (0.5 mg total) by mouth 3 (three) times daily as needed for anxiety  or sleep. Patient not taking: Reported on 05/27/2020 06/06/19   Love, Ivan Anchors, PA-C  aspirin EC 81 MG tablet Take 1 tablet (81 mg total) by mouth daily. 02/12/20   Croitoru, Mihai, MD  atorvastatin (LIPITOR) 80 MG tablet Take 1 tablet (80 mg total) by mouth daily at 6 PM. 12/30/19   Croitoru, Mihai, MD  clopidogrel (PLAVIX) 75 MG tablet Take 1 tablet (75 mg total) by mouth daily. 10/07/19   Kroeger, Lorelee Cover., PA-C  collagenase (SANTYL) ointment Apply topically daily. And cover with damp to dry dressing daily. Change more frequently if soiled 06/06/19   Love, Ivan Anchors, PA-C  fish oil-omega-3 fatty acids 1000 MG capsule Take 1 g by mouth daily.     [provider]  fluticasone Asencion Islam) 50 MCG/ACT nasal spray  11/19/19   [provider]  folic acid (FOLVITE) 1 MG tablet Take 1 tablet (1 mg total) by mouth daily. 06/06/19   Love, Ivan Anchors, PA-C  furosemide (LASIX) 40 MG tablet Take 2 tablets (80 mg total) by mouth daily. 04/12/20   Croitoru, Mihai, MD  gabapentin (NEURONTIN) 300 MG capsule Take 300 mg by mouth 3 (three) times daily.    [provider]  glimepiride (AMARYL) 2 MG tablet Take 1 tablet (2 mg total) by mouth daily with breakfast. 06/06/19   Love, Ivan Anchors, PA-C  Insulin Glargine (TOUJEO SOLOSTAR [place]) Inject 25  Units into the skin at bedtime. 25 units     [provider]  isosorbide mononitrate (IMDUR) 60 MG 24 hr tablet Take 1 tablet (60 mg total) by mouth daily. 02/12/20 02/06/21  Croitoru, Mihai, MD  losartan (COZAAR) 25 MG tablet Take 0.5 tablets (12.5 mg total) by mouth daily. 09/29/19   Kroeger, Lorelee Cover., PA-C  metoprolol tartrate (LOPRESSOR) 25 MG tablet Take 1 tablet (25 mg total) by mouth 2 (two) times daily. 09/29/19   Kroeger, Lorelee Cover., PA-C  mometasone-formoterol (DULERA) 200-5 MCG/ACT AERO Inhale 2 puffs into the lungs 2 (two) times daily. 06/06/19   Love, Ivan Anchors, PA-C  nitroGLYCERIN (NITROSTAT) 0.4 MG SL tablet Place 1 tablet (0.4 mg total) under the  tongue every 5 (five) minutes x 3 doses as needed for chest pain. 04/24/16   Croitoru, Mihai, MD  potassium chloride SA (KLOR-CON) 20 MEQ tablet Take 1 tablet (20 mEq total) by mouth 2 (two) times daily. 02/03/20   Kroeger, Lorelee Cover., PA-C  primidone (MYSOLINE) 50 MG tablet Take 100 mg by mouth at bedtime. 09/18/19   [provider]  terbinafine (LAMISIL) 250 MG tablet Take 250 mg by mouth daily. Patient not taking: Reported on 05/27/2020 11/26/19   [provider]  vitamin C (VITAMIN C) 250 MG tablet Take 1 tablet (250 mg total) by mouth 2 (two) times daily. Patient not taking: Reported on 05/22/2020 06/06/19   Love, Ivan Anchors, PA-C    Family History    Family History  Problem Relation Age of Onset  . Heart disease Father   . Colon cancer Brother        early 57s  . Hypertension Mother   . Heart disease Mother   . Colon cancer Mother        died age 5. late 36s early 17s at diagnosis.   . Lung cancer Mother    He indicated that his mother is deceased. He indicated that his father is deceased. He indicated that his brother is alive. He indicated that his maternal grandfather is deceased. He indicated that his paternal grandmother is deceased. He indicated that his paternal grandfather is deceased.  Social History    Social History   Socioeconomic History  . Marital status: Widowed    Spouse name: Not on file  . Number of children: Not on file  . Years of education: Not on file  . Highest education level: Not on file  Occupational History  . Occupation: Clinical biochemist  Tobacco Use  . Smoking status: Current Some Day Smoker    Packs/day: 1.00    Types: Cigarettes    Last attempt to quit: 06/11/2015    Years since quitting: 4.9  . Smokeless tobacco: Never Used  . Tobacco comment: pt states smoking less than half a pack  Vaping Use  . Vaping Use: Never used  Substance and Sexual Activity  . Alcohol use: No  . Drug use: No  . Sexual activity: Yes  Other  Topics Concern  . Not on file  Social History Narrative  . Not on file   Social Determinants of Health   Financial Resource Strain:   . Difficulty of Paying Living Expenses: Not on file  Food Insecurity: No Food Insecurity  . Worried About Charity fundraiser in the Last Year: Never true  . Ran Out of Food in the Last Year: Never true  Transportation Needs: No Transportation Needs  . Lack of Transportation (Medical): No  . Lack  of Transportation (Non-Medical): No  Physical Activity:   . Days of Exercise per Week: Not on file  . Minutes of Exercise per Session: Not on file  Stress: No Stress Concern Present  . Feeling of Stress : Not at all  Social Connections:   . Frequency of Communication with Friends and Family: Not on file  . Frequency of Social Gatherings with Friends and Family: Not on file  . Attends Religious Services: Not on file  . Active Member of Clubs or Organizations: Not on file  . Attends Archivist Meetings: Not on file  . Marital Status: Not on file  Intimate Partner Violence:   . Fear of Current or Ex-Partner: Not on file  . Emotionally Abused: Not on file  . Physically Abused: Not on file  . Sexually Abused: Not on file     Review of Systems    General:  No chills, fever, night sweats or weight changes.  Cardiovascular:  No chest pain, dyspnea on exertion, edema, orthopnea, palpitations, paroxysmal nocturnal dyspnea. Dermatological: No rash, lesions/masses Respiratory: No cough, dyspnea Urologic: No hematuria, dysuria Abdominal:   No nausea, vomiting, diarrhea, bright red blood per rectum, melena, or hematemesis Neurologic:  No visual changes, wkns, changes in mental status. All other systems reviewed and are otherwise negative except as noted above.  Physical Exam    VS:  There were no vitals taken for this visit. , BMI There is no height or weight on file to calculate BMI. GEN: Well nourished, well developed, in no acute  distress. HEENT: normal. Neck: Supple, no JVD, carotid bruits, or masses. Cardiac: RRR, no murmurs, rubs, or gallops. No clubbing, cyanosis, edema.  Radials/DP/PT 2+ and equal bilaterally.  Respiratory:  Respirations regular and unlabored, clear to auscultation bilaterally. GI: Soft, nontender, nondistended, BS + x 4. MS: no deformity or atrophy. Skin: warm and dry, no rash. Neuro:  Strength and sensation are intact. Psych: Normal affect.  Accessory Clinical Findings    Recent Labs: 07/07/2019: Magnesium 2.1 05/16/2020: ALT 57 05/22/2020: B Natriuretic Peptide 157.0; BUN 69; Creatinine, Ser 1.77; Potassium 4.4; Sodium 136 05/20/2020: Hemoglobin 13.9; Platelets 188   Recent Lipid Panel    Component Value Date/Time   CHOL 83 05/18/2019 0112   TRIG 57 05/18/2019 0112   HDL 28 (L) 05/18/2019 0112   CHOLHDL 3.0 05/18/2019 0112   VLDL 11 05/18/2019 0112   LDLCALC 44 05/18/2019 0112    ECG personally reviewed by me today- *** - No acute changes  Assessment & Plan   1.  ***   Jossie Ng. Satoya Feeley NP-C    05/20/2020, 7:12 AM [place] [address] Office [phone] Fax [phone]  Notice: This dictation was prepared with Dragon dictation along with smaller phrase technology. Any transcriptional errors that result from this process are unintentional and may not be corrected upon review.

## 2020-05-20 NOTE — TOC Initial Note (Signed)
Transition of Care [facility]) - Initial/Assessment Note    Patient Details  Name: Robert [name] MRN: [id number] Date of Birth: 1950-10-14  Transition of Care [facility]) CM/SW Contact:    Natasha Bence, LCSW Phone Number: 05/20/2020, 2:59 PM  Clinical Narrative:                 Patient is a 69 year old male admitted for Acute congestive heart failure. CSW received CHF consult for patient. CSW contacted patient and inquired about patient's ability to sustain a heart healthy, monitor his fluid intake, and take he weight daily. Patient reported that he regularly performs all three task. CSW also inquired about patient's mobility and Hx with PT. Patient reported that at baseline, he is able to get around the home with minimum assistance. Patient reported that he has a Hx with [facility] for PT and would be agreeable to utilize them again if HHPT is recommended. Patient reporte that at this time, he is not willing to [facility] because he does not believe his physical mobility is limited enough to require SNF. TOC to follow.  Expected Discharge Plan: [place] Barriers to Discharge: Continued Medical Work up   Patient Goals and CMS Choice Patient states their goals for this hospitalization and ongoing recovery are:: Return home with [facility] if recommended   Choice offered to / list presented to : Patient  Expected Discharge Plan and Services Expected Discharge Plan: [place]       Living arrangements for the past 2 months: Single Family Home                                      Prior Living Arrangements/Services Living arrangements for the past 2 months: Single Family Home Lives with:: Self Patient language and need for interpreter reviewed:: Yes        Need for Family Participation in Patient Care: Yes (Comment) Care giver support system in place?: Yes (comment)   Criminal Activity/Legal Involvement Pertinent to Current Situation/Hospitalization: No -  Comment as needed  Activities of Daily Living Home Assistive Devices/Equipment: Cane (specify quad or straight), CBG Meter, Oxygen, Walker (specify type) ADL Screening (condition at time of admission) Patient's cognitive ability adequate to safely complete daily activities?: Yes Is the patient deaf or have difficulty hearing?: No Does the patient have difficulty seeing, even when wearing glasses/contacts?: No Does the patient have difficulty concentrating, remembering, or making decisions?: No Patient able to express need for assistance with ADLs?: Yes Does the patient have difficulty dressing or bathing?: No Independently performs ADLs?: Yes (appropriate for developmental age) Does the patient have difficulty walking or climbing stairs?: Yes Weakness of Legs: Left Weakness of Arms/Hands: Left  Permission Sought/Granted Permission sought to share information with : Family Supports Permission granted to share information with : Yes, Verbal Permission Granted     Permission granted to share info w AGENCY: Local HH agencies        Emotional Assessment     Affect (typically observed): Accepting, Appropriate Orientation: : Oriented to Self, Oriented to Situation, Oriented to Place, Oriented to  Time Alcohol / Substance Use: Not Applicable Psych Involvement: No (comment)  Admission diagnosis:  Acute CHF (congestive heart failure) (HCC) [I50.9] Acute respiratory failure with hypoxia (HCC) [J96.01] Acute on chronic congestive heart failure, unspecified heart failure type [facility]) [I50.9] Patient Active Problem List   Diagnosis  Date Noted  . Acute CHF (congestive heart failure) ([place]) 05/17/2020  . Abnormality of gait 10/02/2019  . Leg length discrepancy 10/02/2019  . Primary osteoarthritis of right knee 08/28/2019  . Chronic pain of both knees 08/04/2019  . Acute on chronic combined systolic and diastolic CHF (congestive heart failure) ([place]) 07/05/2019  . Aspiration pneumonia ([place])  07/04/2019  . Unspecified atrial fibrillation ([place]) 07/04/2019  . Anemia 07/04/2019  . Aspiration pneumonitis ([place]) 07/04/2019  . Acute respiratory failure with hypoxia ([place])   . CKD (chronic kidney disease), stage III   . Controlled type 2 diabetes mellitus with hyperglycemia, without long-term current use of insulin ([place])   . Urinary retention   . 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 Hypokalemia   . Physical debility 05/22/2019  . Demand ischemia ([place]) 05/20/2019  . Flash pulmonary edema ([place]) 05/20/2019  . Stage II decubitus ulcer ([place]) 05/18/2019  . Aspiration pneumonia of both lower lobes due to gastric secretions ([place]) 05/15/2019  . MRSA pneumonia ([place]) 05/15/2019  . Severe sepsis with septic shock ([place]) 05/15/2019  . NSTEMI (non-ST elevated myocardial infarction) ([place]) 05/15/2019  . Staphylococcal pneumonia ([place]) 04/20/2019  . HCAP (healthcare-associated pneumonia) 04/20/2019  . Pneumonia due to COVID-19 virus 04/18/2019  . COPD (chronic obstructive pulmonary disease) with emphysema ([place]) 04/18/2019  . AKI (acute kidney injury) ([place]) 04/18/2019  . Diabetes mellitus type 2, uncontrolled, with complications ([place]) 94/85/4627  . Pressure injury of skin 04/18/2019  . Atrial fibrillation with RVR ([place]) 04/15/2019  . COVID-19 virus detected 04/12/2019  . Acute respiratory distress syndrome (ARDS) due to COVID-19 virus ([place]) 04/12/2019  . Hypoxia   . Sepsis ([place])   . PAD (peripheral artery disease) ([place]) 03/27/2019  . Pulmonary edema 08/27/2018  . Acute respiratory failure with hypoxemia ([place]) 08/27/2018  . Chronic combined systolic and diastolic heart failure ([place]) 08/27/2018  . Dyslipidemia (high LDL; low HDL) 03/29/2018  . Dysphagia 01/02/2018  . Diarrhea 01/02/2018  . Family history of colon cancer 01/02/2018  . Ischemic cardiomyopathy   . Insulin dependent diabetes mellitus   . Hypertension   . History of MI (myocardial infarction)   . GERD (gastroesophageal reflux  disease)   . Full dentures   . Coronary artery disease   . Anxiety   . Acute ST elevation myocardial infarction (STEMI) of inferior wall ([place]) 06/06/2017  . Varicose veins of left lower extremity with complications 03/50/0938  . Cellulitis of left leg without foot   . Right radial fracture 12/11/2016  . T3 vertebral fracture ([place]) 12/11/2016  . Fall 12/09/2016  . Thrombocytopenia ([place]) 10/09/2016  . Left leg cellulitis 10/08/2016  . Diabetes ([place]) 10/08/2016  . Inguinal hernia 05/12/2016  . Incarcerated umbilical hernia 18/29/9371  . Chronic combined systolic and diastolic CHF (congestive heart failure) ([place]) 04/25/2016  . High cholesterol   . Controlled type 2 diabetes mellitus with diabetic peripheral angiopathy without gangrene, with long-term current use of insulin ([place])   . Essential hypertension   . Non-STEMI (non-ST elevated myocardial infarction) ([place])   . CAD S/P percutaneous coronary angioplasty   . Unstable angina ([place]) 03/20/2015  . ICM-EF 35% at cath 02/01/15 02/02/2015  . DM type 2 causing vascular disease, not at goal [facility]) 05/29/2014  . Dyspnea 10/20/2013  . COPD (chronic obstructive pulmonary disease) ([place]) 09/03/2013  . Hyperlipidemia   . Old myocardial infarction 09/01/2013  . Abnormal nuclear stress test 05/17/2012  . S/P CFX PTCAI for ISR 02/01/15 05/17/2012  . PVD, chronic  LLE 12/22/2011  . Rheumatoid arthritis ([place]) 12/22/2011  . Contrast media allergy 12/22/2011  . Tobacco abuse 12/21/2011   PCP:  Redmond School, MD Pharmacy:   [facility], [place] [address] [place] [address] Phone: [phone] Fax: [facility] [place], [place] [address] Phone: [phone] Fax: [phone]     Social Determinants of Health (SDOH) Interventions    Readmission Risk Interventions Readmission Risk Prevention Plan  07/07/2019 07/04/2019  Transportation Screening - Complete  PCP or Specialist Appt within 3-5 Days Complete -  HRI or Home Care Consult Complete -  Social Work Consult for [place] Planning/Counseling - Complete  Palliative Care Screening - Not Applicable  Medication Review Press photographer) - Complete  Some recent data might be hidden

## 2020-05-20 NOTE — Progress Notes (Signed)
*  PRELIMINARY RESULTS* Echocardiogram 2D Echocardiogram has been performed.  Leavy Cella 05/20/2020, 2:18 PM

## 2020-05-20 NOTE — Progress Notes (Addendum)
Nutrition Education Note  RD consulted for nutrition education regarding Acute CHF.  RD provided "Heart Failure Nutrition Therapy" handout from the Academy of Nutrition and Dietetics. Reviewed patient's dietary recall. Eating pattens is 2 meals daily. He usually eats egg sandwich for breakfast and occasionally has bacon or sausage. In the evening eats at home mostly. Currently step son is helping to prepare meals but patient says he is thinking  of moving in with family member.  Provided examples on ways to decrease sodium intake in diet. Discouraged intake of processed foods and use of salt shaker. Patient says he doesn't use salt shaker at all. We talked about Ms Deliah Boston as an option for flavor enhancement.   We reviewed fluid restriction 1500 ml currently. He admits to having difficulty limiting fluid intake.   Encouraged fresh fruits and vegetables as well as whole grain sources of carbohydrates to maximize fiber intake.  RD discussed why it is important for patient to adhere to diet recommendations, and emphasized the role of fluids, foods to avoid, and importance of weighing self daily. Teach back method used.  Expect good compliance.   Body mass index is 33.73 kg/m. Pt meets criteria for obese based on current BMI. Current BMI skew related to fluid status. Usual weight 220-225 lb per patient. Weight loss tips handout included in education materials.   Current diet order is CHO modified (1.5 L fluid), patient is consuming approximately 75-100% of meals at this time. Patient doesn't understand why he is getting a salt packet. He has not been adding salt to his meals. Recommend add Heart Healthy diet restriction.  Labs and medications reviewed. BMP Latest Ref Rng & Units 05/20/2020 05/20/2020 05/16/2020  Glucose 70 - 99 mg/dL 175(H) 223(H) 185(H)  BUN 8 - 23 mg/dL 73(H) 69(H) 49(H)  Creatinine 0.61 - 1.24 mg/dL 1.71(H) 1.77(H) 1.63(H)  BUN/Creat Ratio 10 - 24 - - -  Sodium 135 - 145 mmol/L 135  136 135  Potassium 3.5 - 5.1 mmol/L 4.3 4.4 4.4  Chloride 98 - 111 mmol/L 101 103 104  CO2 22 - 32 mmol/L 24 23 24   Calcium 8.9 - 10.3 mg/dL 8.8(L) 8.4(L) 8.3(L)    Nutrition diagnosis: Knowledge deficit related to Low Sodium Nutrition therapy as evidenced by patient report.  Plan: recommend consider changing diet order and adding Heart Healthy therapeutic restriction as well as CHO modified.  Colman Cater MS,RD,CSG,LDN Pager: Shea Evans

## 2020-05-20 NOTE — Progress Notes (Signed)
° °  Progress Note  Patient Name: Robert [name] Date of Encounter: 05/20/2020  Primary Cardiologist: Sanda Klein, MD  Echocardiogram shows LVEF approximately 35% in the setting of atrial fibrillation/flutter with overall controlled ventricular response.  There is also a moderate to large circumferential pericardial effusion which is new in comparison to last study and does show some evidence of hemodynamic significance, although not frank tamponade.  Continue with diuresis at this point with close follow-up of blood pressure.  We will continue to follow.  Signed, Rozann Lesches, MD  05/20/2020, 4:49 PM

## 2020-05-21 ENCOUNTER — Ambulatory Visit: Payer: Self-pay

## 2020-05-21 ENCOUNTER — Ambulatory Visit: Payer: Medicare HMO | Admitting: General Practice

## 2020-05-21 DIAGNOSIS — I5021 Acute systolic (congestive) heart failure: Secondary | ICD-10-CM

## 2020-05-21 DIAGNOSIS — I25119 Atherosclerotic heart disease of native coronary artery with unspecified angina pectoris: Secondary | ICD-10-CM

## 2020-05-21 LAB — BASIC METABOLIC PANEL
Anion gap: 8 (ref 5–15)
BUN: 73 mg/dL — ABNORMAL HIGH (ref 8–23)
CO2: 26 mmol/L (ref 22–32)
Calcium: 8.3 mg/dL — ABNORMAL LOW (ref 8.9–10.3)
Chloride: 99 mmol/L (ref 98–111)
Creatinine, Ser: 1.58 mg/dL — ABNORMAL HIGH (ref 0.61–1.24)
GFR calc Af Amer: 52 mL/min — ABNORMAL LOW (ref >60)
GFR calc non Af Amer: 45 mL/min — ABNORMAL LOW (ref >60)
Glucose, Bld: 171 mg/dL — ABNORMAL HIGH (ref 70–99)
Potassium: 4.2 mmol/L (ref 3.5–5.1)
Sodium: 133 mmol/L — ABNORMAL LOW (ref 135–145)

## 2020-05-21 LAB — TSH: TSH: 5.345 u[IU]/mL — ABNORMAL HIGH (ref 0.350–4.500)

## 2020-05-21 LAB — CBC
HCT: 44.3 % (ref 39.0–52.0)
Hemoglobin: 14 g/dL (ref 13.0–17.0)
MCH: 30.6 pg (ref 26.0–34.0)
MCHC: 31.6 g/dL (ref 30.0–36.0)
MCV: 96.9 fL (ref 80.0–100.0)
Platelets: 211 10*3/uL (ref 150–400)
RBC: 4.57 MIL/uL (ref 4.22–5.81)
RDW: 14.6 % (ref 11.5–15.5)
WBC: 8.8 10*3/uL (ref 4.0–10.5)
nRBC: 0 % (ref 0.0–0.2)

## 2020-05-21 LAB — GLUCOSE, CAPILLARY
Glucose-Capillary: 163 mg/dL — ABNORMAL HIGH (ref 70–99)
Glucose-Capillary: 279 mg/dL — ABNORMAL HIGH (ref 70–99)
Glucose-Capillary: 316 mg/dL — ABNORMAL HIGH (ref 70–99)
Glucose-Capillary: 317 mg/dL — ABNORMAL HIGH (ref 70–99)

## 2020-05-21 LAB — MAGNESIUM: Magnesium: 2.6 mg/dL — ABNORMAL HIGH (ref 1.7–2.4)

## 2020-05-21 LAB — T4, FREE: Free T4: 0.74 ng/dL (ref 0.61–1.12)

## 2020-05-21 NOTE — Care Management Important Message (Signed)
Important Message  Patient Details  Name: Robert [name] MRN: [id number] Date of Birth: [birth year]   Medicare Important Message Given:  Yes     Tommy Medal 05/21/2020, 1:47 PM

## 2020-05-21 NOTE — Progress Notes (Signed)
Progress Note  Patient Name: Robert [name] Date of Encounter: 05/21/2020  Primary Cardiologist: Sanda Klein, MD  Subjective   No chest pain or worsening shortness of breath, no sense of palpitations.  Inpatient Medications    Scheduled Meds: . apixaban  5 mg Oral BID  . atorvastatin  80 mg Oral q1800  . clopidogrel  75 mg Oral Daily  . fluticasone  1 spray Each Nare Daily  . folic acid  1 mg Oral Daily  . furosemide  80 mg Intravenous Q12H  . gabapentin  300 mg Oral TID  . insulin aspart  0-20 Units Subcutaneous TID WC  . insulin aspart  0-5 Units Subcutaneous QHS  . insulin glargine  25 Units Subcutaneous QHS  . isosorbide mononitrate  60 mg Oral Daily  . metoprolol tartrate  25 mg Oral BID  . mometasone-formoterol  2 puff Inhalation BID  . primidone  100 mg Oral QHS  . sodium chloride flush  3 mL Intravenous Q12H   Continuous Infusions: . sodium chloride     PRN Meds: sodium chloride, acetaminophen, ipratropium-albuterol, ondansetron (ZOFRAN) IV, sodium chloride flush   Vital Signs    Vitals:   05/20/20 0500 05/20/20 0748 05/20/20 1409 05/21/20 0823  BP:   112/88   Pulse:   91   Resp:   19   Temp:   98.6 F (37 C)   TempSrc:      SpO2:  94% 97% 97%  Weight: 112.8 kg     Height:        Intake/Output Summary (Last 24 hours) at 05/21/2020 0917 Last data filed at 05/21/2020 0226 Gross per 24 hour  Intake 480 ml  Output 2825 ml  Net -2345 ml   Filed Weights   05/27/2020 1240 05/15/2020 1953 05/20/20 0500  Weight: 108.4 kg 112.3 kg 112.8 kg    Telemetry    Persistent atrial flutter.  Personally reviewed.  ECG    No ECG reviewed.  Physical Exam   GEN:  Obese male.  No acute distress.   Neck:  Difficult to assess JVP. Cardiac:  Irregular, distant, no rub or gallop.  Respiratory: Nonlabored.  Decreased breath sounds at the bases. GI: Soft, nontender, bowel sounds present. MS:  Leg edema as before. Neuro:  Nonfocal. Psych: Alert and oriented x  3. Normal affect.  Labs    Chemistry Recent Labs  Lab 05/16/20 1228 05/16/20 1228 05/17/2020 1310 05/20/20 0629 05/21/20 0445  NA 135   < > 136 135 133*  K 4.4   < > 4.4 4.3 4.2  CL 104   < > 103 101 99  CO2 24   < > 23 24 26   GLUCOSE 185*   < > 223* 175* 171*  BUN 49*   < > 69* 73* 73*  CREATININE 1.63*   < > 1.77* 1.71* 1.58*  CALCIUM 8.3*   < > 8.4* 8.8* 8.3*  PROT 6.4*  --   --   --   --   ALBUMIN 3.2*  --   --   --   --   AST 30  --   --   --   --   ALT 57*  --   --   --   --   ALKPHOS 97  --   --   --   --   BILITOT 0.7  --   --   --   --   GFRNONAA 43*   < > 39*  41* 45*  GFRAA 50*   < > 45* 47* 52*  ANIONGAP 7   < > 10 10 8    < > = values in this interval not displayed.     Hematology Recent Labs  Lab 05/25/2020 1310 05/20/20 0629 05/21/20 0445  WBC 9.3 10.7* 8.8  RBC 4.58 4.49 4.57  HGB 14.5 13.9 14.0  HCT 44.7 43.3 44.3  MCV 97.6 96.4 96.9  MCH 31.7 31.0 30.6  MCHC 32.4 32.1 31.6  RDW 14.7 14.5 14.6  PLT 182 188 211    Cardiac Enzymes Recent Labs  Lab 05/16/20 1228 05/16/20 1436 05/23/2020 1310 05/16/2020 1653  TROPONINIHS 10 10 9 10     BNP Recent Labs  Lab 05/16/20 1228 06/06/2020 1310  BNP 149.0* 157.0*     Radiology    DG Chest Portable 1 View  Result Date: 06/07/2020 CLINICAL DATA:  Chest x-ray 05/16/2020, chest x-ray 07/03/2019 EXAM: PORTABLE CHEST 1 VIEW COMPARISON:  None. FINDINGS: The heart size and mediastinal contours are unchanged with an enlarged cardiac silhouette that may be partially due to a portable AP technique. Aortic arch and descending thoracic aortic calcifications. Vascular hilar fullness. Persistent bilateral perihilar and basilar patchy airspace opacities. Persistent trace left pleural effusion. Nonspecific blunting of the right costophrenic angle. No pneumothorax bilaterally. The visualized skeletal structures are unremarkable. Suggestion of a coronary stent. IMPRESSION: Similar appearing pulmonary edema with suggestion of  a left small pleural effusion. Electronically Signed   By: Iven Finn M.D.   On: 05/14/2020 13:16   ECHOCARDIOGRAM COMPLETE  Result Date: 05/20/2020    ECHOCARDIOGRAM REPORT   Patient Name:   Robert [name] Date of Exam: 05/20/2020 Medical Rec #:  [id number]      Height:       72.0 in Accession #:    [id number]     Weight:       248.7 lb Date of Birth:  Jan 29, 1951      BSA:          2.337 m Patient Age:    69 years       BP:           112/88 mmHg Patient Gender: M              HR:           91 bpm. Exam Location:  [facility] Procedure: 2D Echo Indications:    CHF-Acute Diastolic 220.25 / K27.06  History:        Patient has prior history of Echocardiogram examinations, most                 recent 05/14/2019. Previous Myocardial Infarction, COPD; Risk                 Factors:Diabetes, Dyslipidemia, Hypertension and Current Smoker.                 Acute Respiratory Failure.  Sonographer:    Leavy Cella RDCS (AE) Referring Phys: [place]  1. Left ventricular ejection fraction, by estimation, is approximately 35%. The left ventricle has moderately decreased function. Left ventricular endocardial border not optimally defined to evaluate regional wall motion. There is mild left ventricular hypertrophy. Left ventricular diastolic parameters are indeterminate.  2. Right ventricular systolic function is normal. The right ventricular size is normal. Tricuspid regurgitation signal is inadequate for assessing PA pressure.  3. Left atrial size was upper normal.  4. Moderate to large pericardial effusion. The pericardial effusion is  circumferential. There is undulation of the right atrial free wall, intermittent diastolic compression of the right ventricular free wall (no collapse), and respiratory variation of mitral outflow consistent with hemodynamic significance, although not diagnostic of tamponade (BP and HR noted above).  5. The mitral valve is grossly normal. No evidence of mitral valve  regurgitation.  6. The aortic valve is tricuspid. Aortic valve regurgitation is not visualized.  7. The inferior vena cava is dilated in size with <50% respiratory variability, suggesting right atrial pressure of 15 mmHg. FINDINGS  Left Ventricle: Left ventricular ejection fraction, by estimation, is 35%. The left ventricle has moderately decreased function. Left ventricular endocardial border not optimally defined to evaluate regional wall motion. The left ventricular internal cavity size was normal in size. There is mild left ventricular hypertrophy. Left ventricular diastolic parameters are indeterminate. Right Ventricle: The right ventricular size is normal. No increase in right ventricular wall thickness. Right ventricular systolic function is normal. Tricuspid regurgitation signal is inadequate for assessing PA pressure. Left Atrium: Left atrial size was upper normal. Right Atrium: Right atrial size was normal in size. Pericardium: A moderately sized pericardial effusion is present. The pericardial effusion is circumferential. Mitral Valve: The mitral valve is grossly normal. Mild mitral annular calcification. No evidence of mitral valve regurgitation. Tricuspid Valve: The tricuspid valve is grossly normal. Tricuspid valve regurgitation is trivial. Aortic Valve: The aortic valve is tricuspid. There is mild to moderate aortic valve annular calcification. Aortic valve regurgitation is not visualized. Pulmonic Valve: The pulmonic valve was grossly normal. Pulmonic valve regurgitation is trivial. Aorta: The aortic root is normal in size and structure. Venous: The inferior vena cava is dilated in size with less than 50% respiratory variability, suggesting right atrial pressure of 15 mmHg. IAS/Shunts: No atrial level shunt detected by color flow Doppler.  LEFT VENTRICLE PLAX 2D LVIDd:         4.86 cm  Diastology LVIDs:         4.21 cm  LV e' medial:    7.07 cm/s LV PW:         1.66 cm  LV E/e' medial:  13.6 LV IVS:         1.24 cm  LV e' lateral:   6.64 cm/s LVOT diam:     2.10 cm  LV E/e' lateral: 14.5 LVOT Area:     3.46 cm  RIGHT VENTRICLE RV S prime:     12.20 cm/s TAPSE (M-mode): 1.5 cm LEFT ATRIUM             Index       RIGHT ATRIUM           Index LA diam:        3.80 cm 1.63 cm/m  RA Area:     15.70 cm LA Vol (A2C):   83.5 ml 35.73 ml/m RA Volume:   39.50 ml  16.90 ml/m LA Vol (A4C):   53.3 ml 22.80 ml/m LA Biplane Vol: 67.3 ml 28.79 ml/m   AORTA Ao Root diam: 2.90 cm MITRAL VALVE MV Area (PHT): 4.46 cm    SHUNTS MV Decel Time: 170 msec    Systemic Diam: 2.10 cm MV E velocity: 96.40 cm/s MV A velocity: 38.10 cm/s MV E/A ratio:  2.53 Rozann Lesches MD Electronically signed by Rozann Lesches MD Signature Date/Time: 05/20/2020/4:45:40 PM    Final     Patient Profile     69 y.o. male with a history of multivessel CAD being managed medically, rheumatoid  arthritis, COPD, type 2 diabetes mellitus, hyperlipidemia, COVID-19 in July 2020, and previously documented atrial fibrillation now presenting with acute systolic heart failure and fluid overload as well as atrial fibrillation/flutter.  Assessment & Plan    1.  Atrial fibrillation/flutter, documented by present ECG and telemetry however duration is unclear.  Suspect this could be precipitant for acute systolic heart failure and fluid overload.  He was last in sinus rhythm back in April.  CHA2DS2-VASc score is 5, he has been placed on Eliquis.  Heart rate is in the 90s on Lopressor.  2.  Moderate to large circumferential pericardial effusion with features by echocardiography suggesting hemodynamic significance, although he is not in frank tamponade with stable blood pressure and heart rate.  Currently on IV Lasix for diuresis in the setting of acute systolic heart failure with fluid overload.  3.  Secondary cardiomyopathy, LVEF approximately 35% by follow-up echocardiogram, normal RV contraction.  Possibly secondary to recurrent atrial arrhythmias, LVEF  was normal by echocardiogram last year.  4.  Multivessel CAD with plan for medical therapy based on last angiogram in 2018.  He reports no active angina and is on Plavix as well as statin therapy.  5.  CKD stage IIIb, creatinine has improved somewhat down to 1.58.  I discussed echocardiogram results with the patient and general plan going forward.  He is hemodynamically stable at this time and would continue to focus on IV diuresis for both treatment of acute systolic heart failure with fluid overload and also hopefully further reduction in size of his pericardial effusion without need for pericardiocentesis.  Continue anticoagulation as he will ultimately need a cardioversion, potentially as an outpatient presuming his heart failure status continues to improve clinically.  As noted at presentation he was up 10 to 15 pounds over baseline, diuresed net output of 2600 cc last 24 hours on current dose of Lasix.  Continue Lopressor for heart rate control.  Follow-up limited echocardiogram on Monday for reevaluation of pericardial effusion presuming he continues to respond to current treatment with clinical improvement.  Signed, Rozann Lesches, MD  05/21/2020, 9:17 AM

## 2020-05-21 NOTE — Progress Notes (Signed)
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 He usually takes about 80 mg of Lasix once a day. He has been compliant with his medication. He presented to the emergency department on September 5. He was given 2 doses of IV Lasix 80 mg each. He felt better. He was discharged home. However he has gained a total of about 8 pounds in the last week or so. He started developing shortness of breath in the last 24 hours and so he decided to come back to the hospital. Denies any chest pain dizziness lightheadedness. No syncopal episodes. No fever or chills. No nausea vomiting. hasalso noticed some swelling in his legs although that has not changed much in the last several days. Has history of orthopnea but no PND.  In the emergency department chest x-ray shows pulmonary edema. He was noted to be hypoxic saturating in the late 80s. He has oxygen at home but does not use it on a regular basis. Due to refractory symptoms he will need hospitalization for further management.  9/9: Patient admitted with acute diastolic heart failure. Seen by cardiology with plans to continue IV Lasix diuresis. Approximate 10-15 pound weight gain noted recently. Patient to be started on Eliquis.  9/10: 2D echocardiogram on 9/9 revealing moderate to large pericardial effusion as well as LVEF of 35%.  He has diuresed quite well overnight with -24-hour fluid balance of -2.6 L.  Plan is to continue current diuresis with follow-up limited echo on Monday.  Assessment &  Plan:   Principal Problem:   Acute CHF (congestive heart failure) (HCC) Active Problems:   COPD (chronic obstructive pulmonary disease) (HCC)   Controlled type 2 diabetes mellitus with diabetic peripheral angiopathy without gangrene, with long-term current use of insulin (HCC)   Acute respiratory failure with hypoxemia (HCC)   CKD (chronic kidney disease), stage III   Acute on chronic hypoxemic respiratory failure secondary to acute diastolic CHF exacerbation -Continue IV Lasix 80 mg twice daily as ordered -Monitor intake and output -Wean oxygen down to room air if tolerated -Appreciate cardiology evaluation and input  Moderate to large circumferential pericardial effusion -Patient will need ongoing close monitoring inpatient -Continue diuresis as ordered -Plan for 2D echocardiogram-limited by 9/13  Secondary cardiomyopathy -Related to above -LVEF 35% with normal LVEF on 2D echocardiogram last year -Continue ongoing diuresis, appreciate cardiology recommendations  History of CAD -Multiple prior interventions with last cath 2018 -Not a good candidate for CABG -Continue aspirin and Plavix -Continue beta-blocker and statin  History of CKD stage IIIb -Creatinine level improving and currently 1.58 which is near baseline  Insulin-dependent diabetes with peripheral neuropathy -Continue SSI as well as Lantus -Hemoglobin A1c 7.3%  History of COPD -DuoNebs as needed -No acute bronchospasms noted  Prior history of paroxysmal atrial fibrillation -Per cardiology, plan to continue on Eliquis -CHA2DS2-VASc score 5   History of peripheral artery disease -Outpatient management  Essential hypertension-stable -Monitor carefully with ongoing diuresis   DVT prophylaxis:Heparin to Eliquis Code Status: Full Family Communication: None at bedside Disposition Plan:  Status is: Inpatient  Remains  inpatient appropriate because:IV treatments appropriate due to intensity of  illness or inability to take PO and Inpatient level of care appropriate due to severity of illness   Dispo: The patient is from: Home  Anticipated d/c is to: Home  Anticipated d/c date is: 3 days  Patient currently is not medically stable to d/c. Patient requires ongoing IV Lasix diuresis.  Plan for limited 2D echocardiogram by 9/13 per cardiology.  Consultants:   Cardiology  Procedures:   See below  Antimicrobials:   None   Subjective: Patient seen and evaluated today with no new acute complaints or concerns. No acute concerns or events noted overnight.  He denies any shortness of breath or chest pain.  Lower extremity edema improving.  Objective: Vitals:   06/07/2020 2038 05/20/20 0500 05/20/20 0748 05/20/20 1409  BP:    112/88  Pulse:    91  Resp:    19  Temp:    98.6 F (37 C)  TempSrc:      SpO2: 94%  94% 97%  Weight:  112.8 kg    Height:        Intake/Output Summary (Last 24 hours) at 05/21/2020 0717 Last data filed at 05/21/2020 0226 Gross per 24 hour  Intake 720 ml  Output 3325 ml  Net -2605 ml   Filed Weights   06/10/2020 1240 05/22/2020 1953 05/20/20 0500  Weight: 108.4 kg 112.3 kg 112.8 kg    Examination:  General exam: Appears calm and comfortable  Respiratory system: Clear to auscultation. Respiratory effort normal.  Currently on 2 L nasal cannula oxygen Cardiovascular system: S1 & S2 heard, RRR.  Gastrointestinal system: Abdomen is nondistended, soft and nontender.  Central nervous system: Alert and oriented. No focal neurological deficits. Extremities: 1+ pitting edema noted bilaterally. Skin: No rashes, lesions or ulcers Psychiatry: Judgement and insight appear normal. Mood & affect appropriate.     Data Reviewed: I have personally reviewed following labs and imaging studies  CBC: Recent Labs  Lab 05/16/20 1228 05/31/2020 1310 05/20/20 0629 05/21/20 0445  WBC 8.2 9.3 10.7* 8.8  NEUTROABS 6.7 7.9*  9.0*  --   HGB 14.4 14.5 13.9 14.0  HCT 44.9 44.7 43.3 44.3  MCV 97.8 97.6 96.4 96.9  PLT 160 182 188 161   Basic Metabolic Panel: Recent Labs  Lab 05/16/20 1228 05/15/2020 1310 05/20/20 0629 05/21/20 0445  NA 135 136 135 133*  K 4.4 4.4 4.3 4.2  CL 104 103 101 99  CO2 24 23 24 26   GLUCOSE 185* 223* 175* 171*  BUN 49* 69* 73* 73*  CREATININE 1.63* 1.77* 1.71* 1.58*  CALCIUM 8.3* 8.4* 8.8* 8.3*  MG  --   --   --  2.6*   GFR: Estimated Creatinine Clearance: 58.8 mL/min (A) (by C-G formula based on SCr of 1.58 mg/dL (H)). Liver Function Tests: Recent Labs  Lab 05/16/20 1228  AST 30  ALT 57*  ALKPHOS 97  BILITOT 0.7  PROT 6.4*  ALBUMIN 3.2*   No results for input(s): LIPASE, AMYLASE in the last 168 hours. No results for input(s): AMMONIA in the last 168 hours. Coagulation Profile: No results for input(s): INR, PROTIME in the last 168 hours. Cardiac Enzymes: No results for input(s): CKTOTAL, CKMB, CKMBINDEX, TROPONINI in the last 168 hours. BNP (last 3 results) No results for input(s): PROBNP in the last 8760 hours. HbA1C: Recent Labs    05/20/20 0629  HGBA1C 7.3*   CBG: Recent Labs  Lab 06/08/2020 2042 05/20/20 0755 05/20/20  1105 05/20/20 1638 05/20/20 2103  GLUCAP 421* 153* 227* 212* 271*   Lipid Profile: No results for input(s): CHOL, HDL, LDLCALC, TRIG, CHOLHDL, LDLDIRECT in the last 72 hours. Thyroid Function Tests: No results for input(s): TSH, T4TOTAL, FREET4, T3FREE, THYROIDAB in the last 72 hours. Anemia Panel: No results for input(s): VITAMINB12, FOLATE, FERRITIN, TIBC, IRON, RETICCTPCT in the last 72 hours. Sepsis Labs: Recent Labs  Lab 05/18/2020 1342  LATICACIDVEN 0.8    Recent Results (from the past 240 hour(s))  SARS Coronavirus 2 by RT PCR (hospital order, performed in [facility] hospital lab) Nasopharyngeal Nasopharyngeal Swab     Status: None   Collection Time: 05/16/20  3:23 PM   Specimen: Nasopharyngeal Swab  Result Value Ref  Range Status   SARS Coronavirus 2 NEGATIVE NEGATIVE Final    Comment: (NOTE) SARS-CoV-2 target nucleic acids are NOT DETECTED.  The SARS-CoV-2 RNA is generally detectable in upper and lower respiratory specimens during the acute phase of infection. The lowest concentration of SARS-CoV-2 viral copies this assay can detect is 250 copies / mL. A negative result does not preclude SARS-CoV-2 infection and should not be used as the sole basis for treatment or other patient management decisions.  A negative result may occur with improper specimen collection / handling, submission of specimen other than nasopharyngeal swab, presence of viral mutation(s) within the areas targeted by this assay, and inadequate number of viral copies (<250 copies / mL). A negative result must be combined with clinical observations, patient history, and epidemiological information.  Fact Sheet for Patients:   StrictlyIdeas.no  Fact Sheet for Healthcare Providers: BankingDealers.co.za  This test is not yet approved or  cleared by the [place] FDA and has been authorized for detection and/or diagnosis of SARS-CoV-2 by FDA under an Emergency Use Authorization (EUA).  This EUA will remain in effect (meaning this test can be used) for the duration of the COVID-19 declaration under Section 564(b)(1) of the Act, 21 U.S.C. section 360bbb-3(b)(1), unless the authorization is terminated or revoked sooner.  Performed at [facility], [address], [place], [place] [address]   SARS Coronavirus 2 by RT PCR (hospital order, performed in [facility] hospital lab) Nasopharyngeal Nasopharyngeal Swab     Status: None   Collection Time: 05/27/2020  1:20 PM   Specimen: Nasopharyngeal Swab  Result Value Ref Range Status   SARS Coronavirus 2 NEGATIVE NEGATIVE Final    Comment: (NOTE) SARS-CoV-2 target nucleic acids are NOT DETECTED.  The SARS-CoV-2 RNA is generally detectable  in upper and lower respiratory specimens during the acute phase of infection. The lowest concentration of SARS-CoV-2 viral copies this assay can detect is 250 copies / mL. A negative result does not preclude SARS-CoV-2 infection and should not be used as the sole basis for treatment or other patient management decisions.  A negative result may occur with improper specimen collection / handling, submission of specimen other than nasopharyngeal swab, presence of viral mutation(s) within the areas targeted by this assay, and inadequate number of viral copies (<250 copies / mL). A negative result must be combined with clinical observations, patient history, and epidemiological information.  Fact Sheet for Patients:   StrictlyIdeas.no  Fact Sheet for Healthcare Providers: BankingDealers.co.za  This test is not yet approved or  cleared by the [place] FDA and has been authorized for detection and/or diagnosis of SARS-CoV-2 by FDA under an Emergency Use Authorization (EUA).  This EUA will remain in effect (meaning this test can be used)  for the duration of the COVID-19 declaration under Section 564(b)(1) of the Act, 21 U.S.C. section 360bbb-3(b)(1), unless the authorization is terminated or revoked sooner.  Performed at [facility], [address] [place], [place] [address]   Blood culture (routine x 2)     Status: None (Preliminary result)   Collection Time: 05/15/2020  1:42 PM   Specimen: BLOOD LEFT HAND  Result Value Ref Range Status   Specimen Description   Final    BLOOD LEFT HAND BOTTLES DRAWN AEROBIC AND ANAEROBIC   Special Requests Blood Culture adequate volume  Final   Culture   Final    NO GROWTH < 24 HOURS Performed at [facility], [address] [place], [place] [address]    Report Status PENDING  Incomplete  Blood culture (routine x 2)     Status: None (Preliminary result)   Collection Time: 05/21/2020  1:43 PM    Specimen: Left Antecubital; Blood  Result Value Ref Range Status   Specimen Description   Final    LEFT ANTECUBITAL BOTTLES DRAWN AEROBIC AND ANAEROBIC   Special Requests Blood Culture adequate volume  Final   Culture   Final    NO GROWTH < 24 HOURS Performed at [facility], [address], [place], [place] [address]    Report Status PENDING  Incomplete  MRSA PCR Screening     Status: None   Collection Time: 05/20/20  5:36 PM   Specimen: Nasopharyngeal  Result Value Ref Range Status   MRSA by PCR NEGATIVE NEGATIVE Final    Comment:        The GeneXpert MRSA Assay (FDA approved for NASAL specimens only), is one component of a comprehensive MRSA colonization surveillance program. It is not intended to diagnose MRSA infection nor to guide or monitor treatment for MRSA infections. Performed at [facility], [address], [place], [place] [address]          Radiology Studies: DG Chest Portable 1 View  Result Date: 05/12/2020 CLINICAL DATA:  Chest x-ray 05/16/2020, chest x-ray 07/03/2019 EXAM: PORTABLE CHEST 1 VIEW COMPARISON:  None. FINDINGS: The heart size and mediastinal contours are unchanged with an enlarged cardiac silhouette that may be partially due to a portable AP technique. Aortic arch and descending thoracic aortic calcifications. Vascular hilar fullness. Persistent bilateral perihilar and basilar patchy airspace opacities. Persistent trace left pleural effusion. Nonspecific blunting of the right costophrenic angle. No pneumothorax bilaterally. The visualized skeletal structures are unremarkable. Suggestion of a coronary stent. IMPRESSION: Similar appearing pulmonary edema with suggestion of a left small pleural effusion. Electronically Signed   By: Iven Finn M.D.   On: 05/18/2020 13:16   ECHOCARDIOGRAM COMPLETE  Result Date: 05/20/2020    ECHOCARDIOGRAM REPORT   Patient Name:   Robert [name] Date of Exam: 05/20/2020 Medical Rec #:  [id number]      Height:       72.0  in Accession #:    [id number]     Weight:       248.7 lb Date of Birth:  04/30/1951      BSA:          2.337 m Patient Age:    40 years       BP:           112/88 mmHg Patient Gender: M              HR:           91 bpm. Exam Location:  [facility] Procedure: 2D Echo Indications:    CHF-Acute Diastolic 325.49 / I26.41  History:        Patient has prior history of Echocardiogram examinations, most                 recent 05/14/2019. Previous Myocardial Infarction, COPD; Risk                 Factors:Diabetes, Dyslipidemia, Hypertension and Current Smoker.                 Acute Respiratory Failure.  Sonographer:    Leavy Cella RDCS (AE) Referring Phys: [place]  1. Left ventricular ejection fraction, by estimation, is approximately 35%. The left ventricle has moderately decreased function. Left ventricular endocardial border not optimally defined to evaluate regional wall motion. There is mild left ventricular hypertrophy. Left ventricular diastolic parameters are indeterminate.  2. Right ventricular systolic function is normal. The right ventricular size is normal. Tricuspid regurgitation signal is inadequate for assessing PA pressure.  3. Left atrial size was upper normal.  4. Moderate to large pericardial effusion. The pericardial effusion is circumferential. There is undulation of the right atrial free wall, intermittent diastolic compression of the right ventricular free wall (no collapse), and respiratory variation of mitral outflow consistent with hemodynamic significance, although not diagnostic of tamponade (BP and HR noted above).  5. The mitral valve is grossly normal. No evidence of mitral valve regurgitation.  6. The aortic valve is tricuspid. Aortic valve regurgitation is not visualized.  7. The inferior vena cava is dilated in size with <50% respiratory variability, suggesting right atrial pressure of 15 mmHg. FINDINGS  Left Ventricle: Left ventricular ejection fraction, by  estimation, is 35%. The left ventricle has moderately decreased function. Left ventricular endocardial border not optimally defined to evaluate regional wall motion. The left ventricular internal cavity size was normal in size. There is mild left ventricular hypertrophy. Left ventricular diastolic parameters are indeterminate. Right Ventricle: The right ventricular size is normal. No increase in right ventricular wall thickness. Right ventricular systolic function is normal. Tricuspid regurgitation signal is inadequate for assessing PA pressure. Left Atrium: Left atrial size was upper normal. Right Atrium: Right atrial size was normal in size. Pericardium: A moderately sized pericardial effusion is present. The pericardial effusion is circumferential. Mitral Valve: The mitral valve is grossly normal. Mild mitral annular calcification. No evidence of mitral valve regurgitation. Tricuspid Valve: The tricuspid valve is grossly normal. Tricuspid valve regurgitation is trivial. Aortic Valve: The aortic valve is tricuspid. There is mild to moderate aortic valve annular calcification. Aortic valve regurgitation is not visualized. Pulmonic Valve: The pulmonic valve was grossly normal. Pulmonic valve regurgitation is trivial. Aorta: The aortic root is normal in size and structure. Venous: The inferior vena cava is dilated in size with less than 50% respiratory variability, suggesting right atrial pressure of 15 mmHg. IAS/Shunts: No atrial level shunt detected by color flow Doppler.  LEFT VENTRICLE PLAX 2D LVIDd:         4.86 cm  Diastology LVIDs:         4.21 cm  LV e' medial:    7.07 cm/s LV PW:         1.66 cm  LV E/e' medial:  13.6 LV IVS:        1.24 cm  LV e' lateral:   6.64 cm/s LVOT diam:     2.10 cm  LV E/e' lateral: 14.5 LVOT Area:     3.46  cm  RIGHT VENTRICLE RV S prime:     12.20 cm/s TAPSE (M-mode): 1.5 cm LEFT ATRIUM             Index       RIGHT ATRIUM           Index LA diam:        3.80 cm 1.63 cm/m  RA  Area:     15.70 cm LA Vol (A2C):   83.5 ml 35.73 ml/m RA Volume:   39.50 ml  16.90 ml/m LA Vol (A4C):   53.3 ml 22.80 ml/m LA Biplane Vol: 67.3 ml 28.79 ml/m   AORTA Ao Root diam: 2.90 cm MITRAL VALVE MV Area (PHT): 4.46 cm    SHUNTS MV Decel Time: 170 msec    Systemic Diam: 2.10 cm MV E velocity: 96.40 cm/s MV A velocity: 38.10 cm/s MV E/A ratio:  2.53 Rozann Lesches MD Electronically signed by Rozann Lesches MD Signature Date/Time: 05/20/2020/4:45:40 PM    Final         Scheduled Meds: . apixaban  5 mg Oral BID  . atorvastatin  80 mg Oral q1800  . clopidogrel  75 mg Oral Daily  . fluticasone  1 spray Each Nare Daily  . folic acid  1 mg Oral Daily  . furosemide  80 mg Intravenous Q12H  . gabapentin  300 mg Oral TID  . insulin aspart  0-20 Units Subcutaneous TID WC  . insulin aspart  0-5 Units Subcutaneous QHS  . insulin glargine  25 Units Subcutaneous QHS  . isosorbide mononitrate  60 mg Oral Daily  . metoprolol tartrate  25 mg Oral BID  . mometasone-formoterol  2 puff Inhalation BID  . primidone  100 mg Oral QHS  . sodium chloride flush  3 mL Intravenous Q12H   Continuous Infusions: . sodium chloride       LOS: 2 days    Time spent: 35 minutes    Tanairy Payeur Darleen Crocker, DO [facility]  If 7PM-7AM, please contact night-coverage [email or web address] 05/21/2020, 7:17 AM

## 2020-05-22 LAB — GLUCOSE, CAPILLARY
Glucose-Capillary: 282 mg/dL — ABNORMAL HIGH (ref 70–99)
Glucose-Capillary: 383 mg/dL — ABNORMAL HIGH (ref 70–99)
Glucose-Capillary: 353 mg/dL — ABNORMAL HIGH (ref 70–99)
Glucose-Capillary: 381 mg/dL — ABNORMAL HIGH (ref 70–99)

## 2020-05-22 LAB — BASIC METABOLIC PANEL
Anion gap: 9 (ref 5–15)
BUN: 82 mg/dL — ABNORMAL HIGH (ref 8–23)
CO2: 28 mmol/L (ref 22–32)
Calcium: 8.5 mg/dL — ABNORMAL LOW (ref 8.9–10.3)
Chloride: 97 mmol/L — ABNORMAL LOW (ref 98–111)
Creatinine, Ser: 1.69 mg/dL — ABNORMAL HIGH (ref 0.61–1.24)
GFR calc Af Amer: 48 mL/min — ABNORMAL LOW (ref >60)
GFR calc non Af Amer: 41 mL/min — ABNORMAL LOW (ref >60)
Glucose, Bld: 254 mg/dL — ABNORMAL HIGH (ref 70–99)
Potassium: 4.4 mmol/L (ref 3.5–5.1)
Sodium: 134 mmol/L — ABNORMAL LOW (ref 135–145)

## 2020-05-22 LAB — MAGNESIUM: Magnesium: 2.5 mg/dL — ABNORMAL HIGH (ref 1.7–2.4)

## 2020-05-22 MED ORDER — MELATONIN 3 MG PO TABS
3.0000 mg | ORAL_TABLET | Freq: Every day | ORAL | Status: DC
  Administered 2020-05-23 – 2020-06-14 (×22): 3 mg via ORAL
  Filled 2020-05-22 (×24): qty 1

## 2020-05-22 MED ORDER — DICLOFENAC SODIUM 1 % EX GEL
2.0000 g | Freq: Three times a day (TID) | CUTANEOUS | Status: DC | PRN
  Filled 2020-05-22: qty 100

## 2020-05-22 MED ORDER — FUROSEMIDE 80 MG PO TABS
80.0000 mg | ORAL_TABLET | Freq: Every day | ORAL | Status: DC
  Administered 2020-05-22: 80 mg via ORAL
  Filled 2020-05-22 (×2): qty 1

## 2020-05-22 MED ORDER — FUROSEMIDE 10 MG/ML IJ SOLN: 80.0000 mg | Freq: Two times a day (BID) | INTRAMUSCULAR | Status: DC

## 2020-05-22 MED ORDER — MUSCLE RUB 10-15 % EX CREA
TOPICAL_CREAM | CUTANEOUS | Status: DC | PRN
  Filled 2020-05-22: qty 85

## 2020-05-22 NOTE — Progress Notes (Signed)
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 He usually takes about 80 mg of Lasix once a day. He has been compliant with his medication. He presented to the emergency department on September 5. He was given 2 doses of IV Lasix 80 mg each. He felt better. He was discharged home. However he has gained a total of about 8 pounds in the last week or so. He started developing shortness of breath in the last 24 hours and so he decided to come back to the hospital. Denies any chest pain dizziness lightheadedness. No syncopal episodes. No fever or chills. No nausea vomiting. hasalso noticed some swelling in his legs although that has not changed much in the last several days. Has history of orthopnea but no PND.  In the emergency department chest x-ray shows pulmonary edema. He was noted to be hypoxic saturating in the late 80s. He has oxygen at home but does not use it on a regular basis. Due to refractory symptoms he will need hospitalization for further management.  9/9:Patient admitted with acute diastolic heart failure. Seen by cardiology with plans to continue IV Lasix diuresis. Approximate 10-15 pound weight gain noted recently. Patient to be started on Eliquis.  9/10: 2D echocardiogram on 9/9 revealing moderate to large pericardial effusion as well as LVEF of 35%.  He has diuresed quite well overnight with -24-hour fluid balance of -2.6 L.  Plan is to continue current diuresis with follow-up limited echo on Monday.  9/11:  Patient appears as though he is more euvolemic today, creatinine uptrending as well and blood pressures are softer.  Will plan to hold Imdur and discontinue IV Lasix today.  Metoprolol with holding parameters.  Resume home oral Lasix and follow lab work.  Wean oxygen as tolerated.  Still on 3 L nasal cannula oxygen.  Assessment & Plan:   Principal Problem:   Acute CHF (congestive heart failure) (HCC) Active Problems:   COPD (chronic obstructive pulmonary disease) (HCC)   Controlled type 2 diabetes mellitus with diabetic peripheral angiopathy without gangrene, with long-term current use of insulin (HCC)   Acute respiratory failure with hypoxemia (HCC)   CKD (chronic kidney disease), stage III   Acute on chronic hypoxemic respiratory failure secondary to acute diastolic CHF exacerbation -Discontinue IV Lasix and placed on oral Lasix per home dose -Monitor intake and output -Wean oxygen down to room air if tolerated -Appreciate cardiology evaluation and input by 9/13  Moderate to large circumferential pericardial effusion -Patient will need ongoing close monitoring inpatient -Continue diuresis as ordered -Plan for 2D echocardiogram-limited by 9/13  Secondary cardiomyopathy -Related to above -LVEF 35% with normal LVEF on 2D echocardiogram last year -Continue ongoing diuresis, appreciate cardiology recommendations  History of CAD -Multiple prior interventions with last cath 2018 -Not a good candidate for CABG -Continue aspirin and Plavix -Continue beta-blocker and statin  History of CKD stage IIIb -Creatinine level starting to uptrend -Plan to switch IV Lasix to oral and monitor blood pressures carefully -Recheck labs in a.m.  Insulin-dependent diabetes with peripheral neuropathy -Continue SSI as well  as Lantus -Hemoglobin A1c 7.3%  History of COPD -DuoNebs as needed -No acute bronchospasms noted  Prior history of paroxysmal atrial fibrillation -Per cardiology, plan  to continue on Eliquis -CHA2DS2-VASc score 5   History of peripheral artery disease -Outpatient management  Essential hypertension-stable -Monitor carefully with ongoing diuresis   DVT prophylaxis: Eliquis Code Status:Full Family Communication:None at bedside Disposition Plan: Status is: Inpatient  Remains inpatient appropriate because:IV treatments appropriate due to intensity of illness or inability to take PO and Inpatient level of care appropriate due to severity of illness   Dispo: The patient is from:Home Anticipated d/c is YJ:EHUD Anticipated d/c date is: 2-3 days Patient currently is not medically stable to d/c.Patient requires ongoing IV Lasix diuresis.  Plan for limited 2D echocardiogram by 9/13 per cardiology.  Consultants:  Cardiology  Procedures:  See below  Antimicrobials:   None  Subjective: Patient seen and evaluated today with no new acute complaints or concerns. No acute concerns or events noted overnight.  Objective: Vitals:   05/21/20 2218 05/22/20 0614 05/22/20 0748 05/22/20 0925  BP: 129/79 99/77  122/75  Pulse: 79 94  (!) 109  Resp: 20 20  20   Temp: 97.9 F (36.6 C) 98.6 F (37 C)    TempSrc:  Oral    SpO2: 97% 100% 97% 100%  Weight:      Height:        Intake/Output Summary (Last 24 hours) at 05/22/2020 1042 Last data filed at 05/22/2020 0935 Gross per 24 hour  Intake 6 ml  Output 400 ml  Net -394 ml   Filed Weights   06/06/2020 1240 05/13/2020 1953 05/20/20 0500  Weight: 108.4 kg 112.3 kg 112.8 kg    Examination:  General exam: Appears calm and comfortable  Respiratory system: Clear to auscultation. Respiratory effort normal.  He is currently on 3 L nasal cannula oxygen. Cardiovascular system: S1 & S2 heard, RRR. Gastrointestinal system: Abdomen is nondistended, soft and nontender.  Central nervous system: Alert and oriented. No focal neurological  deficits. Extremities: Edema is much improved. Skin: No rashes, lesions or ulcers Psychiatry: Judgement and insight appear normal. Mood & affect appropriate.     Data Reviewed: I have personally reviewed following labs and imaging studies  CBC: Recent Labs  Lab 05/16/20 1228 06/02/2020 1310 05/20/20 0629 05/21/20 0445  WBC 8.2 9.3 10.7* 8.8  NEUTROABS 6.7 7.9* 9.0*  --   HGB 14.4 14.5 13.9 14.0  HCT 44.9 44.7 43.3 44.3  MCV 97.8 97.6 96.4 96.9  PLT 160 182 188 149   Basic Metabolic Panel: Recent Labs  Lab 05/16/20 1228 05/18/2020 1310 05/20/20 0629 05/21/20 0445 05/22/20 0840  NA 135 136 135 133* 134*  K 4.4 4.4 4.3 4.2 4.4  CL 104 103 101 99 97*  CO2 24 23 24 26 28   GLUCOSE 185* 223* 175* 171* 254*  BUN 49* 69* 73* 73* 82*  CREATININE 1.63* 1.77* 1.71* 1.58* 1.69*  CALCIUM 8.3* 8.4* 8.8* 8.3* 8.5*  MG  --   --   --  2.6* 2.5*   GFR: Estimated Creatinine Clearance: 55 mL/min (A) (by C-G formula based on SCr of 1.69 mg/dL (H)). Liver Function Tests: Recent Labs  Lab 05/16/20 1228  AST 30  ALT 57*  ALKPHOS 97  BILITOT 0.7  PROT 6.4*  ALBUMIN 3.2*   No results for input(s): LIPASE, AMYLASE in the last 168 hours. No results for input(s): AMMONIA in the last 168 hours. Coagulation Profile: No results for input(s): INR,  PROTIME in the last 168 hours. Cardiac Enzymes: No results for input(s): CKTOTAL, CKMB, CKMBINDEX, TROPONINI in the last 168 hours. BNP (last 3 results) No results for input(s): PROBNP in the last 8760 hours. HbA1C: Recent Labs    05/20/20 0629  HGBA1C 7.3*   CBG: Recent Labs  Lab 05/21/20 0727 05/21/20 1120 05/21/20 1643 05/21/20 2142 05/22/20 0805  GLUCAP 163* 279* 316* 317* 282*   Lipid Profile: No results for input(s): CHOL, HDL, LDLCALC, TRIG, CHOLHDL, LDLDIRECT in the last 72 hours. Thyroid Function Tests: Recent Labs    05/21/20 0445  TSH 5.345*  FREET4 0.74   Anemia Panel: No results for input(s): VITAMINB12,  FOLATE, FERRITIN, TIBC, IRON, RETICCTPCT in the last 72 hours. Sepsis Labs: Recent Labs  Lab 06/04/2020 1342  LATICACIDVEN 0.8    Recent Results (from the past 240 hour(s))  SARS Coronavirus 2 by RT PCR (hospital order, performed in [facility] hospital lab) Nasopharyngeal Nasopharyngeal Swab     Status: None   Collection Time: 05/16/20  3:23 PM   Specimen: Nasopharyngeal Swab  Result Value Ref Range Status   SARS Coronavirus 2 NEGATIVE NEGATIVE Final    Comment: (NOTE) SARS-CoV-2 target nucleic acids are NOT DETECTED.  The SARS-CoV-2 RNA is generally detectable in upper and lower respiratory specimens during the acute phase of infection. The lowest concentration of SARS-CoV-2 viral copies this assay can detect is 250 copies / mL. A negative result does not preclude SARS-CoV-2 infection and should not be used as the sole basis for treatment or other patient management decisions.  A negative result may occur with improper specimen collection / handling, submission of specimen other than nasopharyngeal swab, presence of viral mutation(s) within the areas targeted by this assay, and inadequate number of viral copies (<250 copies / mL). A negative result must be combined with clinical observations, patient history, and epidemiological information.  Fact Sheet for Patients:   StrictlyIdeas.no  Fact Sheet for Healthcare Providers: BankingDealers.co.za  This test is not yet approved or  cleared by the [place] FDA and has been authorized for detection and/or diagnosis of SARS-CoV-2 by FDA under an Emergency Use Authorization (EUA).  This EUA will remain in effect (meaning this test can be used) for the duration of the COVID-19 declaration under Section 564(b)(1) of the Act, 21 U.S.C. section 360bbb-3(b)(1), unless the authorization is terminated or revoked sooner.  Performed at [facility], [address], [place], [place]  [address]   SARS Coronavirus 2 by RT PCR (hospital order, performed in [facility] hospital lab) Nasopharyngeal Nasopharyngeal Swab     Status: None   Collection Time: 06/01/2020  1:20 PM   Specimen: Nasopharyngeal Swab  Result Value Ref Range Status   SARS Coronavirus 2 NEGATIVE NEGATIVE Final    Comment: (NOTE) SARS-CoV-2 target nucleic acids are NOT DETECTED.  The SARS-CoV-2 RNA is generally detectable in upper and lower respiratory specimens during the acute phase of infection. The lowest concentration of SARS-CoV-2 viral copies this assay can detect is 250 copies / mL. A negative result does not preclude SARS-CoV-2 infection and should not be used as the sole basis for treatment or other patient management decisions.  A negative result may occur with improper specimen collection / handling, submission of specimen other than nasopharyngeal swab, presence of viral mutation(s) within the areas targeted by this assay, and inadequate number of viral copies (<250 copies / mL). A negative result must be combined with clinical observations, patient history, and epidemiological information.  Fact  Sheet for Patients:   StrictlyIdeas.no  Fact Sheet for Healthcare Providers: BankingDealers.co.za  This test is not yet approved or  cleared by the [place] FDA and has been authorized for detection and/or diagnosis of SARS-CoV-2 by FDA under an Emergency Use Authorization (EUA).  This EUA will remain in effect (meaning this test can be used) for the duration of the COVID-19 declaration under Section 564(b)(1) of the Act, 21 U.S.C. section 360bbb-3(b)(1), unless the authorization is terminated or revoked sooner.  Performed at [facility], [address] [place], [place] [address]   Blood culture (routine x 2)     Status: None (Preliminary result)   Collection Time: 05/12/2020  1:42 PM   Specimen: BLOOD LEFT HAND  Result Value Ref Range Status    Specimen Description   Final    BLOOD LEFT HAND BOTTLES DRAWN AEROBIC AND ANAEROBIC   Special Requests Blood Culture adequate volume  Final   Culture   Final    NO GROWTH 2 DAYS Performed at [facility], [address] [place], [place] [address]    Report Status PENDING  Incomplete  Blood culture (routine x 2)     Status: None (Preliminary result)   Collection Time: 05/18/2020  1:43 PM   Specimen: Left Antecubital; Blood  Result Value Ref Range Status   Specimen Description   Final    LEFT ANTECUBITAL BOTTLES DRAWN AEROBIC AND ANAEROBIC   Special Requests Blood Culture adequate volume  Final   Culture   Final    NO GROWTH 2 DAYS Performed at [facility], [address] [place], [place] [address]    Report Status PENDING  Incomplete  MRSA PCR Screening     Status: None   Collection Time: 05/20/20  5:36 PM   Specimen: Nasopharyngeal  Result Value Ref Range Status   MRSA by PCR NEGATIVE NEGATIVE Final    Comment:        The GeneXpert MRSA Assay (FDA approved for NASAL specimens only), is one component of a comprehensive MRSA colonization surveillance program. It is not intended to diagnose MRSA infection nor to guide or monitor treatment for MRSA infections. Performed at [facility], [address], [place], [place] [address]          Radiology Studies: ECHOCARDIOGRAM COMPLETE  Result Date: 05/20/2020    ECHOCARDIOGRAM REPORT   Patient Name:   REDA GETTIS Date of Exam: 05/20/2020 Medical Rec #:  [id number]      Height:       72.0 in Accession #:    [id number]     Weight:       248.7 lb Date of Birth:  05-12-1951      BSA:          2.337 m Patient Age:    28 years       BP:           112/88 mmHg Patient Gender: M              HR:           91 bpm. Exam Location:  [facility] Procedure: 2D Echo Indications:    CHF-Acute Diastolic 675.44 / B20.10  History:        Patient has prior history of Echocardiogram examinations, most                 recent 05/14/2019. Previous  Myocardial Infarction, COPD; Risk  Factors:Diabetes, Dyslipidemia, Hypertension and Current Smoker.                 Acute Respiratory Failure.  Sonographer:    Leavy Cella RDCS (AE) Referring Phys: [place]  1. Left ventricular ejection fraction, by estimation, is approximately 35%. The left ventricle has moderately decreased function. Left ventricular endocardial border not optimally defined to evaluate regional wall motion. There is mild left ventricular hypertrophy. Left ventricular diastolic parameters are indeterminate.  2. Right ventricular systolic function is normal. The right ventricular size is normal. Tricuspid regurgitation signal is inadequate for assessing PA pressure.  3. Left atrial size was upper normal.  4. Moderate to large pericardial effusion. The pericardial effusion is circumferential. There is undulation of the right atrial free wall, intermittent diastolic compression of the right ventricular free wall (no collapse), and respiratory variation of mitral outflow consistent with hemodynamic significance, although not diagnostic of tamponade (BP and HR noted above).  5. The mitral valve is grossly normal. No evidence of mitral valve regurgitation.  6. The aortic valve is tricuspid. Aortic valve regurgitation is not visualized.  7. The inferior vena cava is dilated in size with <50% respiratory variability, suggesting right atrial pressure of 15 mmHg. FINDINGS  Left Ventricle: Left ventricular ejection fraction, by estimation, is 35%. The left ventricle has moderately decreased function. Left ventricular endocardial border not optimally defined to evaluate regional wall motion. The left ventricular internal cavity size was normal in size. There is mild left ventricular hypertrophy. Left ventricular diastolic parameters are indeterminate. Right Ventricle: The right ventricular size is normal. No increase in right ventricular wall thickness. Right  ventricular systolic function is normal. Tricuspid regurgitation signal is inadequate for assessing PA pressure. Left Atrium: Left atrial size was upper normal. Right Atrium: Right atrial size was normal in size. Pericardium: A moderately sized pericardial effusion is present. The pericardial effusion is circumferential. Mitral Valve: The mitral valve is grossly normal. Mild mitral annular calcification. No evidence of mitral valve regurgitation. Tricuspid Valve: The tricuspid valve is grossly normal. Tricuspid valve regurgitation is trivial. Aortic Valve: The aortic valve is tricuspid. There is mild to moderate aortic valve annular calcification. Aortic valve regurgitation is not visualized. Pulmonic Valve: The pulmonic valve was grossly normal. Pulmonic valve regurgitation is trivial. Aorta: The aortic root is normal in size and structure. Venous: The inferior vena cava is dilated in size with less than 50% respiratory variability, suggesting right atrial pressure of 15 mmHg. IAS/Shunts: No atrial level shunt detected by color flow Doppler.  LEFT VENTRICLE PLAX 2D LVIDd:         4.86 cm  Diastology LVIDs:         4.21 cm  LV e' medial:    7.07 cm/s LV PW:         1.66 cm  LV E/e' medial:  13.6 LV IVS:        1.24 cm  LV e' lateral:   6.64 cm/s LVOT diam:     2.10 cm  LV E/e' lateral: 14.5 LVOT Area:     3.46 cm  RIGHT VENTRICLE RV S prime:     12.20 cm/s TAPSE (M-mode): 1.5 cm LEFT ATRIUM             Index       RIGHT ATRIUM           Index LA diam:        3.80 cm 1.63 cm/m  RA Area:  15.70 cm LA Vol (A2C):   83.5 ml 35.73 ml/m RA Volume:   39.50 ml  16.90 ml/m LA Vol (A4C):   53.3 ml 22.80 ml/m LA Biplane Vol: 67.3 ml 28.79 ml/m   AORTA Ao Root diam: 2.90 cm MITRAL VALVE MV Area (PHT): 4.46 cm    SHUNTS MV Decel Time: 170 msec    Systemic Diam: 2.10 cm MV E velocity: 96.40 cm/s MV A velocity: 38.10 cm/s MV E/A ratio:  2.53 Rozann Lesches MD Electronically signed by Rozann Lesches MD Signature  Date/Time: 05/20/2020/4:45:40 PM    Final         Scheduled Meds: . apixaban  5 mg Oral BID  . atorvastatin  80 mg Oral q1800  . clopidogrel  75 mg Oral Daily  . fluticasone  1 spray Each Nare Daily  . folic acid  1 mg Oral Daily  . furosemide  80 mg Intravenous BID  . gabapentin  300 mg Oral TID  . insulin aspart  0-20 Units Subcutaneous TID WC  . insulin aspart  0-5 Units Subcutaneous QHS  . insulin glargine  25 Units Subcutaneous QHS  . metoprolol tartrate  25 mg Oral BID  . mometasone-formoterol  2 puff Inhalation BID  . primidone  100 mg Oral QHS  . sodium chloride flush  3 mL Intravenous Q12H   Continuous Infusions: . sodium chloride       LOS: 3 days    Time spent: 30 minutes    Willow Reczek Darleen Crocker, DO [facility]  If 7PM-7AM, please contact night-coverage [email or web address] 05/22/2020, 10:42 AM

## 2020-05-22 NOTE — Progress Notes (Signed)
Robert [name] also notified of increased heart rate. Patient was sustaining in 130-140s. Denies chest pain, S.O.B., or palpitations. Awaiting response.

## 2020-05-22 NOTE — Progress Notes (Signed)
Received call from telemetry, Pt HR is sustaining in the upper 130-140s. Pt denies chest pain, S.O.B, or palpitation. Sitting on the side of the bed on the phone with family at that time. Mid-level notified. Awaiting orders.

## 2020-05-22 NOTE — Progress Notes (Signed)
Pt resting in bed with eyes closed. HR: 95 at this time.

## 2020-05-23 LAB — CBC
HCT: 46.1 % (ref 39.0–52.0)
Hemoglobin: 14.8 g/dL (ref 13.0–17.0)
MCH: 31 pg (ref 26.0–34.0)
MCHC: 32.1 g/dL (ref 30.0–36.0)
MCV: 96.6 fL (ref 80.0–100.0)
Platelets: 232 10*3/uL (ref 150–400)
RBC: 4.77 MIL/uL (ref 4.22–5.81)
RDW: 14.6 % (ref 11.5–15.5)
WBC: 9.8 10*3/uL (ref 4.0–10.5)
nRBC: 0 % (ref 0.0–0.2)

## 2020-05-23 LAB — GLUCOSE, CAPILLARY
Glucose-Capillary: 208 mg/dL — ABNORMAL HIGH (ref 70–99)
Glucose-Capillary: 350 mg/dL — ABNORMAL HIGH (ref 70–99)
Glucose-Capillary: 341 mg/dL — ABNORMAL HIGH (ref 70–99)
Glucose-Capillary: 370 mg/dL — ABNORMAL HIGH (ref 70–99)

## 2020-05-23 LAB — URINALYSIS, ROUTINE W REFLEX MICROSCOPIC
Bilirubin Urine: NEGATIVE
Glucose, UA: 150 mg/dL — AB
Ketones, ur: NEGATIVE mg/dL
Leukocytes,Ua: NEGATIVE
Nitrite: NEGATIVE
Protein, ur: 30 mg/dL — AB
Specific Gravity, Urine: 1.012 (ref 1.005–1.030)
pH: 5 (ref 5.0–8.0)

## 2020-05-23 LAB — MAGNESIUM: Magnesium: 2.5 mg/dL — ABNORMAL HIGH (ref 1.7–2.4)

## 2020-05-23 LAB — SODIUM, URINE, RANDOM: Sodium, Ur: 7 mmol/L

## 2020-05-23 LAB — BASIC METABOLIC PANEL
Anion gap: 9 (ref 5–15)
BUN: 90 mg/dL — ABNORMAL HIGH (ref 8–23)
CO2: 25 mmol/L (ref 22–32)
Calcium: 8.4 mg/dL — ABNORMAL LOW (ref 8.9–10.3)
Chloride: 94 mmol/L — ABNORMAL LOW (ref 98–111)
Creatinine, Ser: 1.99 mg/dL — ABNORMAL HIGH (ref 0.61–1.24)
GFR calc Af Amer: 39 mL/min — ABNORMAL LOW (ref >60)
GFR calc non Af Amer: 34 mL/min — ABNORMAL LOW (ref >60)
Glucose, Bld: 273 mg/dL — ABNORMAL HIGH (ref 70–99)
Potassium: 4.5 mmol/L (ref 3.5–5.1)
Sodium: 128 mmol/L — ABNORMAL LOW (ref 135–145)

## 2020-05-23 LAB — OSMOLALITY: Osmolality: 316 mOsm/kg — ABNORMAL HIGH (ref 275–295)

## 2020-05-23 LAB — OSMOLALITY, URINE: Osmolality, Ur: 391 mOsm/kg (ref 300–900)

## 2020-05-23 LAB — CREATININE, URINE, RANDOM: Creatinine, Urine: 104.37 mg/dL

## 2020-05-23 MED ORDER — FUROSEMIDE 10 MG/ML IJ SOLN
80.0000 mg | Freq: Two times a day (BID) | INTRAMUSCULAR | Status: DC
  Administered 2020-05-23: 80 mg via INTRAVENOUS
  Filled 2020-05-23: qty 8

## 2020-05-23 MED ORDER — FUROSEMIDE 10 MG/ML IJ SOLN
80.0000 mg | Freq: Three times a day (TID) | INTRAMUSCULAR | Status: DC
  Administered 2020-05-23 – 2020-05-24 (×3): 80 mg via INTRAVENOUS
  Filled 2020-05-23 (×3): qty 8

## 2020-05-23 NOTE — Consult Note (Signed)
[facility]  HISTORY AND PHYSICAL  Robert [name] is an 69 y.o. male.    Chief Complaint: SOB  HPI: Pt is a 81 M with a PMH sig for HTN, HLD, COPD, CHF with EF 35%, pericardial effusion, CKD 3b who is now seen in consultation at the request of Dr Manuella Ghazi for eval and recs re: AKi on CKD and vol overload.    Pt presented to [facility] 9/8 with SOB, weight gain.  Went to ED 05/16/20 and was discharged.  Had gained 8 lbs the 3 days prior to admission.  Was hypoxic in the 80s on RA in the ED.  Was admitted.    Cr on admission was 1.77.  Cr improved to 1.58 on 9/10 and up to 1.99 today.  Has been on varying doses of Lasix, had had PO lasix yesterday without much effect.  Very little UOP, did have some UOP today with one dose of IV Lasix.  In this setting we are asked to see.  Pt notes that his legs are very cold.  He thinks that his weights have stayed the same and that his SOB is about the same too.  PMH: Past Medical History:  Diagnosis Date  . Anxiety   . CHF (congestive heart failure) ([place])   . Collagen vascular disease ([place])   . COPD (chronic obstructive pulmonary disease) ([place])   . Coronary artery disease   . COVID-19   . Full dentures   . GERD (gastroesophageal reflux disease)    Rolaids as needed  . High cholesterol   . History of MI (myocardial infarction)   . Hypertension    med. dosage increased 04/2016; has been on BP med. x 10 yrs.  . Incarcerated umbilical hernia 16/6063  . Inguinal hernia 05/2016   bilateral   . Insulin dependent diabetes mellitus   . Ischemic cardiomyopathy    a. 03/2015 EF 40-45% by LV gram.  . Left leg cellulitis 10/08/2016  . Rheumatoid arthritis(714.0)    PSH: Past Surgical History:  Procedure Laterality Date  . CARDIAC CATHETERIZATION N/A 02/01/2015   Procedure: Left Heart Cath and Coronary Angiography;  Surgeon: Belva Crome, MD;  Location: [place] [facility];  Service: Cardiovascular;  Laterality: N/A;  . CARDIAC CATHETERIZATION N/A  03/22/2015   Procedure: Left Heart Cath and Coronary Angiography;  Surgeon: Leonie Man, MD;  Location: [place] [facility];  Service: Cardiovascular;  Laterality: N/A;  . CARDIAC CATHETERIZATION  09/05/2002; 03/09/2003; 04/10/2005;06/02/2005; 05/09/2006; 02/09/2007; 03/01/2007  . COLONOSCOPY  2008   Dr. Gala Romney: diverticulosis, hyperplastic polyp.  . CORONARY ANGIOPLASTY  11/14/2012; 02/01/2015  . CORONARY ANGIOPLASTY WITH STENT PLACEMENT  05/16/2012   "1; makes total ~ 4"  . CORONARY STENT INTERVENTION N/A 06/06/2017   Procedure: CORONARY STENT INTERVENTION;  Surgeon: Belva Crome, MD;  Location: [place] [facility];  Service: Cardiovascular;  Laterality: N/A;  . INSERTION OF MESH Bilateral 05/24/2016   Procedure: INSERTION OF MESH;  Surgeon: Coralie Keens, MD;  Location: [place];  Service: General;  Laterality: Bilateral;  . LAPAROSCOPIC CHOLECYSTECTOMY    . LAPAROSCOPIC INGUINAL HERNIA WITH UMBILICAL HERNIA Bilateral 05/24/2016   Procedure: BILATERAL LAPAROSCOPIC INGUINAL HERNIA REPAIR WITH MESH AND UMBILICAL HERNIA REPAIR WITH MESH;  Surgeon: Coralie Keens, MD;  Location: [place];  Service: General;  Laterality: Bilateral;  . LEFT HEART CATH AND CORONARY ANGIOGRAPHY N/A 06/06/2017   Procedure: LEFT HEART CATH AND CORONARY ANGIOGRAPHY;  Surgeon: Belva Crome, MD;  Location: [place] [facility];  Service: Cardiovascular;  Laterality: N/A;  . LEFT HEART CATHETERIZATION WITH CORONARY ANGIOGRAM N/A 12/22/2011   Procedure: LEFT HEART CATHETERIZATION WITH CORONARY ANGIOGRAM;  Surgeon: Troy Sine, MD;  Location: [facility] CATH LAB;  Service: Cardiovascular;  Laterality: N/A;  . LEFT HEART CATHETERIZATION WITH CORONARY ANGIOGRAM Bilateral 05/16/2012   Procedure: LEFT HEART CATHETERIZATION WITH CORONARY ANGIOGRAM;  Surgeon: Lorretta Harp, MD;  Location: [facility] CATH LAB;  Service: Cardiovascular;  Laterality: Bilateral;  . LEFT HEART CATHETERIZATION WITH CORONARY ANGIOGRAM N/A  11/14/2012   Procedure: LEFT HEART CATHETERIZATION WITH CORONARY ANGIOGRAM;  Surgeon: Troy Sine, MD;  Location: [facility] CATH LAB;  Service: Cardiovascular;  Laterality: N/A;  . LEFT HEART CATHETERIZATION WITH CORONARY ANGIOGRAM N/A 09/01/2013   Procedure: LEFT HEART CATHETERIZATION WITH CORONARY ANGIOGRAM;  Surgeon: Leonie Man, MD;  Location: [facility] CATH LAB;  Service: Cardiovascular;  Laterality: N/A;  . [place]; 1973; 1985   x 3  . NM MYOCAR PERF WALL MOTION  08/30/2009   No significant ischemia  . OPEN REDUCTION INTERNAL FIXATION (ORIF) DISTAL RADIAL FRACTURE Right 12/10/2016   Procedure: OPEN REDUCTION INTERNAL FIXATION (ORIF) DISTAL RADIAL FRACTURE;  Surgeon: Iran Planas, MD;  Location: [place];  Service: Orthopedics;  Laterality: Right;  . PERCUTANEOUS CORONARY INTERVENTION-BALLOON ONLY  12/22/2011   Procedure: PERCUTANEOUS CORONARY INTERVENTION-BALLOON ONLY;  Surgeon: Troy Sine, MD;  Location: [facility] CATH LAB;  Service: Cardiovascular;;  . PERCUTANEOUS CORONARY INTERVENTION-BALLOON ONLY  11/14/2012   Procedure: PERCUTANEOUS CORONARY INTERVENTION-BALLOON ONLY;  Surgeon: Troy Sine, MD;  Location: [facility] CATH LAB;  Service: Cardiovascular;;  . PERCUTANEOUS CORONARY STENT INTERVENTION (PCI-S)  05/16/2012   Procedure: PERCUTANEOUS CORONARY STENT INTERVENTION (PCI-S);  Surgeon: Lorretta Harp, MD;  Location: [facility] CATH LAB;  Service: Cardiovascular;;  . PERCUTANEOUS CORONARY STENT INTERVENTION (PCI-S)  09/01/2013   Procedure: PERCUTANEOUS CORONARY STENT INTERVENTION (PCI-S);  Surgeon: Leonie Man, MD;  Location: [facility] CATH LAB;  Service: Cardiovascular;;    Past Medical History:  Diagnosis Date  . Anxiety   . CHF (congestive heart failure) ([place])   . Collagen vascular disease ([place])   . COPD (chronic obstructive pulmonary disease) ([place])   . Coronary artery disease   . COVID-19   . Full dentures   . GERD (gastroesophageal reflux disease)    Rolaids as needed  . High cholesterol    . History of MI (myocardial infarction)   . Hypertension    med. dosage increased 04/2016; has been on BP med. x 10 yrs.  . Incarcerated umbilical hernia 35/3299  . Inguinal hernia 05/2016   bilateral   . Insulin dependent diabetes mellitus   . Ischemic cardiomyopathy    a. 03/2015 EF 40-45% by LV gram.  . Left leg cellulitis 10/08/2016  . Rheumatoid arthritis(714.0)     Medications:   Scheduled: . apixaban  5 mg Oral BID  . atorvastatin  80 mg Oral q1800  . clopidogrel  75 mg Oral Daily  . fluticasone  1 spray Each Nare Daily  . folic acid  1 mg Oral Daily  . furosemide  80 mg Intravenous TID  . gabapentin  300 mg Oral TID  . insulin aspart  0-20 Units Subcutaneous TID WC  . insulin aspart  0-5 Units Subcutaneous QHS  . insulin glargine  25 Units Subcutaneous QHS  . melatonin  3 mg Oral QHS  . metoprolol tartrate  25 mg Oral BID  . mometasone-formoterol  2 puff Inhalation BID  .  primidone  100 mg Oral QHS  . sodium chloride flush  3 mL Intravenous Q12H    Medications Prior to Admission  Medication Sig Dispense Refill  . acarbose (PRECOSE) 100 MG tablet Take 1 tablet (100 mg total) by mouth 3 (three) times daily with meals. 90 tablet 0  . acetaminophen (TYLENOL) 650 MG CR tablet Take 650 mg by mouth every 8 (eight) hours as needed for pain.    [name] aspirin EC 81 MG tablet Take 1 tablet (81 mg total) by mouth daily. 90 tablet 3  . atorvastatin (LIPITOR) 80 MG tablet Take 1 tablet (80 mg total) by mouth daily at 6 PM. 90 tablet 1  . clopidogrel (PLAVIX) 75 MG tablet Take 1 tablet (75 mg total) by mouth daily. 90 tablet 3  . fish oil-omega-3 fatty acids 1000 MG capsule Take 1 g by mouth daily.     . fluticasone (FLONASE) 50 MCG/ACT nasal spray     . folic acid (FOLVITE) 1 MG tablet Take 1 tablet (1 mg total) by mouth daily. 30 tablet 0  . furosemide (LASIX) 40 MG tablet Take 2 tablets (80 mg total) by mouth daily. 90 tablet 1  . gabapentin (NEURONTIN) 300 MG capsule Take 300 mg  by mouth 3 (three) times daily.    [name] glimepiride (AMARYL) 2 MG tablet Take 1 tablet (2 mg total) by mouth daily with breakfast. 30 tablet 0  . Insulin Glargine (TOUJEO SOLOSTAR [place]) Inject 25 Units into the skin at bedtime. 25 units     . isosorbide mononitrate (IMDUR) 60 MG 24 hr tablet Take 1 tablet (60 mg total) by mouth daily. 90 tablet 3  . losartan (COZAAR) 25 MG tablet Take 0.5 tablets (12.5 mg total) by mouth daily. 90 tablet 3  . metoprolol tartrate (LOPRESSOR) 25 MG tablet Take 1 tablet (25 mg total) by mouth 2 (two) times daily. 180 tablet 3  . mometasone-formoterol (DULERA) 200-5 MCG/ACT AERO Inhale 2 puffs into the lungs 2 (two) times daily. 13 g 0  . nitroGLYCERIN (NITROSTAT) 0.4 MG SL tablet Place 1 tablet (0.4 mg total) under the tongue every 5 (five) minutes x 3 doses as needed for chest pain. 25 tablet 2  . potassium chloride SA (KLOR-CON) 20 MEQ tablet Take 1 tablet (20 mEq total) by mouth 2 (two) times daily. 90 tablet 1  . primidone (MYSOLINE) 50 MG tablet Take 100 mg by mouth at bedtime.    . ALPRAZolam (XANAX) 0.5 MG tablet Take 1 tablet (0.5 mg total) by mouth 3 (three) times daily as needed for anxiety or sleep. (Patient not taking: Reported on 06/06/2020) 45 tablet 0  . collagenase (SANTYL) ointment Apply topically daily. And cover with damp to dry dressing daily. Change more frequently if soiled 30 g 1  . terbinafine (LAMISIL) 250 MG tablet Take 250 mg by mouth daily. (Patient not taking: Reported on 05/16/2020)    . vitamin C (VITAMIN C) 250 MG tablet Take 1 tablet (250 mg total) by mouth 2 (two) times daily. (Patient not taking: Reported on 05/21/2020) 60 tablet 0    ALLERGIES:   Allergies  Allergen Reactions  . Ivp Dye [Iodinated Diagnostic Agents] Itching and Rash    FAM HX: Family History  Problem Relation Age of Onset  . Heart disease Father   . Colon cancer Brother        early 9s  . Hypertension Mother   . Heart disease Mother   . Colon cancer Mother  died age 18. late 67s early 5s at diagnosis.   . Lung cancer Mother     Social History:   reports that he has been smoking cigarettes. He has been smoking about 1.00 pack per day. He has never used smokeless tobacco. He reports that he does not drink alcohol and does not use drugs.  ROS: ROS: all other systems reviewed and are negative except as per HPI  Blood pressure 99/71, pulse (!) 101, temperature (!) 97.5 F (36.4 C), temperature source Oral, resp. rate 20, height 6' (1.829 m), weight 116.3 kg, SpO2 95 %. PHYSICAL EXAM: Physical Exam  GEN: lying in bed, NAD HEENT EOMI PERRL NECK thick beard obscures JVD PULM bibasilar crackles bilaterally, otherwise poor air movement CV irregular, dim heart sounds ABD obese, distended+ fluid wave EXT 2+ LE edema.  Hands and especially feet from mid-shin down are cool NEURO AAO x 3 nonfocal SKIN no rashes MSK no effusions   Results for orders placed or performed during the hospital encounter of 05/14/2020 (from the past 48 hour(s))  Glucose, capillary     Status: Abnormal   Collection Time: 05/21/20  4:43 PM  Result Value Ref Range   Glucose-Capillary 316 (H) 70 - 99 mg/dL    Comment: Glucose reference range applies only to samples taken after fasting for at least 8 hours.  Glucose, capillary     Status: Abnormal   Collection Time: 05/21/20  9:42 PM  Result Value Ref Range   Glucose-Capillary 317 (H) 70 - 99 mg/dL    Comment: Glucose reference range applies only to samples taken after fasting for at least 8 hours.  Glucose, capillary     Status: Abnormal   Collection Time: 05/22/20  8:05 AM  Result Value Ref Range   Glucose-Capillary 282 (H) 70 - 99 mg/dL    Comment: Glucose reference range applies only to samples taken after fasting for at least 8 hours.   Comment 1 Notify RN    Comment 2 Document in Chart   Basic metabolic panel     Status: Abnormal   Collection Time: 05/22/20  8:40 AM  Result Value Ref Range   Sodium 134 (L) 135  - 145 mmol/L   Potassium 4.4 3.5 - 5.1 mmol/L   Chloride 97 (L) 98 - 111 mmol/L   CO2 28 22 - 32 mmol/L   Glucose, Bld 254 (H) 70 - 99 mg/dL    Comment: Glucose reference range applies only to samples taken after fasting for at least 8 hours.   BUN 82 (H) 8 - 23 mg/dL   Creatinine, Ser 1.69 (H) 0.61 - 1.24 mg/dL   Calcium 8.5 (L) 8.9 - 10.3 mg/dL   GFR calc non Af Amer 41 (L) >60 mL/min   GFR calc Af Amer 48 (L) >60 mL/min   Anion gap 9 5 - 15    Comment: Performed at [facility], [address], [place], [place] [address]  Magnesium     Status: Abnormal   Collection Time: 05/22/20  8:40 AM  Result Value Ref Range   Magnesium 2.5 (H) 1.7 - 2.4 mg/dL    Comment: Performed at [facility], [address], [place], [place] [address]  Glucose, capillary     Status: Abnormal   Collection Time: 05/22/20 11:51 AM  Result Value Ref Range   Glucose-Capillary 383 (H) 70 - 99 mg/dL    Comment: Glucose reference range applies only to samples taken after fasting for at least 8 hours.  Comment 1 Notify RN    Comment 2 Document in Chart   Glucose, capillary     Status: Abnormal   Collection Time: 05/22/20  4:38 PM  Result Value Ref Range   Glucose-Capillary 353 (H) 70 - 99 mg/dL    Comment: Glucose reference range applies only to samples taken after fasting for at least 8 hours.   Comment 1 Notify RN    Comment 2 Document in Chart   Glucose, capillary     Status: Abnormal   Collection Time: 05/22/20  9:08 PM  Result Value Ref Range   Glucose-Capillary 381 (H) 70 - 99 mg/dL    Comment: Glucose reference range applies only to samples taken after fasting for at least 8 hours.  Glucose, capillary     Status: Abnormal   Collection Time: 05/23/20  7:45 AM  Result Value Ref Range   Glucose-Capillary 208 (H) 70 - 99 mg/dL    Comment: Glucose reference range applies only to samples taken after fasting for at least 8 hours.  Basic metabolic panel     Status: Abnormal   Collection Time: 05/23/20   9:08 AM  Result Value Ref Range   Sodium 128 (L) 135 - 145 mmol/L   Potassium 4.5 3.5 - 5.1 mmol/L   Chloride 94 (L) 98 - 111 mmol/L   CO2 25 22 - 32 mmol/L   Glucose, Bld 273 (H) 70 - 99 mg/dL    Comment: Glucose reference range applies only to samples taken after fasting for at least 8 hours.   BUN 90 (H) 8 - 23 mg/dL   Creatinine, Ser 1.99 (H) 0.61 - 1.24 mg/dL   Calcium 8.4 (L) 8.9 - 10.3 mg/dL   GFR calc non Af Amer 34 (L) >60 mL/min   GFR calc Af Amer 39 (L) >60 mL/min   Anion gap 9 5 - 15    Comment: Performed at [facility], [address] [place], [place] [address]  Magnesium     Status: Abnormal   Collection Time: 05/23/20  9:08 AM  Result Value Ref Range   Magnesium 2.5 (H) 1.7 - 2.4 mg/dL    Comment: Performed at [facility], [address] [place], [place] [address]  CBC     Status: None   Collection Time: 05/23/20  9:08 AM  Result Value Ref Range   WBC 9.8 4.0 - 10.5 K/uL   RBC 4.77 4.22 - 5.81 MIL/uL   Hemoglobin 14.8 13.0 - 17.0 g/dL   HCT 46.1 39 - 52 %   MCV 96.6 80.0 - 100.0 fL   MCH 31.0 26.0 - 34.0 pg   MCHC 32.1 30.0 - 36.0 g/dL   RDW 14.6 11.5 - 15.5 %   Platelets 232 150 - 400 K/uL   nRBC 0.0 0.0 - 0.2 %    Comment: Performed at [facility], [address] [place], [place] [address]  Glucose, capillary     Status: Abnormal   Collection Time: 05/23/20 11:16 AM  Result Value Ref Range   Glucose-Capillary 350 (H) 70 - 99 mg/dL    Comment: Glucose reference range applies only to samples taken after fasting for at least 8 hours.    No results found.  Assessment/Plan 1.  AKI on CKD 3b: secondary to likely acute CHF exacerbation.  Will get UA, don't think that renal US will be of high yield so will not order.  He is sig volume overloaded and needs aggressive diuresis.  Lasix 80 IV TID.  I am concerned that he is so volume overloaded in the setting of his EF and the pericardial effusion that we may not be able to make much headway with diuresis alone and  he may need an inotrope, but I will defer that decision to the expert judgement of cardiology, especially if his urine output remains poor.  Afib and softer pressures can also exacerbate AKI and I suspect that is playing a role too.    2.  Acute systolic CHF exacerbation with EF 35%: IV Lasix as above, cardiology following.  ? Inotrope needed as above.  Pt's hands and feet are cool- pt says that his legs are always this cold.  He does not seem to be in frank cardiogenic shock.  3.  Atrial fibrillation: on metoprolol 25 mg BID and Eliquis  4.  Moderate to large pericardial effusion: seems to be hemodynamically significant but not in tamponade.  Cardiology following  5.  Hyponatremia: hypervolemic hyponatremia, expect to improve with diuresis.  6.  Dispo: on the floor  Sadira Standard 05/23/2020, 2:12 PM

## 2020-05-23 NOTE — Progress Notes (Signed)
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 He usually takes about 80 mg of Lasix once a day. He has been compliant with his medication. He presented to the emergency department on September 5. He was given 2 doses of IV Lasix 80 mg each. He felt better. He was discharged home. However he has gained a total of about 8 pounds in the last week or so. He started developing shortness of breath in the last 24 hours and so he decided to come back to the hospital. Denies any chest pain dizziness lightheadedness. No syncopal episodes. No fever or chills. No nausea vomiting. hasalso noticed some swelling in his legs although that has not changed much in the last several days. Has history of orthopnea but no PND.  In the emergency department chest x-ray shows pulmonary edema. He was noted to be hypoxic saturating in the late 80s. He has oxygen at home but does not use it on a regular basis. Due to refractory symptoms he will need hospitalization for further management.  9/9:Patient admitted with acute diastolic heart failure. Seen by cardiology with plans to continue IV Lasix diuresis. Approximate 10-15 pound weight gain noted recently. Patient to be started on Eliquis.  9/10:2D echocardiogram on 9/9 revealing moderate to large pericardial effusion as well as LVEF of 35%. He has diuresed quite well overnight with -24-hour fluid balance of -2.6 L.Plan is to continue current diuresis with follow-up limited echo on Monday.  9/11:  Patient appears as though he is more euvolemic today, creatinine uptrending as well and blood pressures are softer.  Will plan to hold Imdur and discontinue IV Lasix today.  Metoprolol with holding parameters.  Resume home oral Lasix and follow lab work.  Wean oxygen as tolerated.  Still on 3 L nasal cannula oxygen.  9/12: Patient noted to have worsening renal function this morning with very minimal urine output noted overnight.  Urine studies ordered for further evaluation.  He was given 1 dose of IV Lasix this morning, but after labs have returned, these have been discontinued.  He continues to remain volume overloaded.  Consult to nephrology for further evaluation appreciated.  Assessment & Plan:   Principal Problem:   Acute CHF (congestive heart failure) (HCC) Active Problems:   COPD (chronic obstructive pulmonary disease) (HCC)   Controlled type 2 diabetes mellitus with diabetic peripheral angiopathy without gangrene, with long-term current use of insulin (HCC)   Acute respiratory failure with hypoxemia (HCC)   CKD (chronic kidney disease), stage III   Acute on chronic hypoxemic respiratory failure secondary to acute diastolic CHF exacerbation -Discontinue Lasix given worsening renal function -Monitor intake and output -Wean oxygen down toroom air if tolerated -Appreciate cardiology evaluation and input by 9/13 -Baseline weight approximately 230 pounds and he is currently at 68.  Moderate to large circumferential pericardial effusion -Patient will need ongoing close monitoring inpatient -Hold further diuresis on account of AKI -Plan for 2D echocardiogram-limited by 9/13  Secondary cardiomyopathy -Related to above -LVEF 35% with normal LVEF on 2D echocardiogram last year -Hold further diuresis for  now  Hyponatremia likely in setting of volume overload -Sodium currently 128 -Repeat labs in a.m. 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  Insulin-dependent diabetes with peripheral neuropathy -Continue SSI as well as Lantus -Noted hyperglycemia, that has improved -Continue to follow and adjust as needed -Hemoglobin A1c7.3%  History of COPD -DuoNebs as needed -No acute bronchospasms noted  Prior history of paroxysmal atrial fibrillation -Per cardiology, plan tocontinue on Eliquis -CHA2DS2-VASc score 5   History of peripheral artery disease -Outpatient management  Essential hypertension-stable -Monitor carefully  -Hold further diuresis for now   DVT prophylaxis: Eliquis Code Status:Full Family Communication:None at bedside Disposition Plan: Status is: Inpatient  Remains inpatient appropriate because:IV treatments appropriate due to intensity of illness or inability to take PO and Inpatient level of care appropriate due to severity of illness   Dispo: The patient is from:Home Anticipated d/c is XV:QMGQ Anticipated d/c date is:2-3days Patient currently is not medically stable to d/c.Patient requires ongoing IV Lasix diuresis.Plan for limited 2D echocardiogram by 9/13 per cardiology.  Consultants:  Cardiology  Nephrology  Procedures:  See below  Antimicrobials:   None   Subjective: Patient seen and evaluated today with very poor urine output noted overnight.  He continues to have lower extremity edema and remains on nasal cannula oxygen.  He denies any chest pain or shortness of  breath.  Objective: Vitals:   05/22/20 2106 05/23/20 0500 05/23/20 0641 05/23/20 0931  BP: 129/88  116/68 99/71  Pulse: (!) 56  (!) 58 (!) 101  Resp: 16  20   Temp: (!) 97.3 F (36.3 C)  (!) 97.5 F (36.4 C)   TempSrc:   Oral   SpO2: 98%  (!) 85% 95%  Weight:  116.3 kg    Height:        Intake/Output Summary (Last 24 hours) at 05/23/2020 1112 Last data filed at 05/23/2020 0900 Gross per 24 hour  Intake 580 ml  Output --  Net 580 ml   Filed Weights   06/02/2020 1953 05/20/20 0500 05/23/20 0500  Weight: 112.3 kg 112.8 kg 116.3 kg    Examination:  General exam: Appears calm and comfortable  Respiratory system: Clear to auscultation. Respiratory effort normal.  Currently on 2 L nasal cannula oxygen. Cardiovascular system: S1 & S2 heard, RRR.  Gastrointestinal system: Abdomen is nondistended, soft and nontender.  Central nervous system: Alert and oriented. No focal neurological deficits. Extremities: Bilateral 2+ pitting edema to upper shin Skin: No rashes, lesions or ulcers Psychiatry: Judgement and insight appear normal. Mood & affect appropriate.     Data Reviewed: I have personally reviewed following labs and imaging studies  CBC: Recent Labs  Lab 05/16/20 1228 05/27/2020 1310 05/20/20 0629 05/21/20 0445 05/23/20 0908  WBC 8.2 9.3 10.7* 8.8 9.8  NEUTROABS 6.7 7.9* 9.0*  --   --   HGB 14.4 14.5 13.9 14.0 14.8  HCT 44.9 44.7 43.3 44.3 46.1  MCV 97.8 97.6 96.4 96.9 96.6  PLT 160 182 188 211 676   Basic Metabolic Panel: Recent Labs  Lab 06/02/2020 1310 05/20/20 0629 05/21/20  0445 05/22/20 0840 05/23/20 0908  NA 136 135 133* 134* 128*  K 4.4 4.3 4.2 4.4 4.5  CL 103 101 99 97* 94*  CO2 23 24 26 28 25   GLUCOSE 223* 175* 171* 254* 273*  BUN 69* 73* 73* 82* 90*  CREATININE 1.77* 1.71* 1.58* 1.69* 1.99*  CALCIUM 8.4* 8.8* 8.3* 8.5* 8.4*  MG  --   --  2.6* 2.5* 2.5*   GFR: Estimated Creatinine Clearance: 47.4 mL/min (A) (by C-G formula based on SCr of 1.99  mg/dL (H)). Liver Function Tests: Recent Labs  Lab 05/16/20 1228  AST 30  ALT 57*  ALKPHOS 97  BILITOT 0.7  PROT 6.4*  ALBUMIN 3.2*   No results for input(s): LIPASE, AMYLASE in the last 168 hours. No results for input(s): AMMONIA in the last 168 hours. Coagulation Profile: No results for input(s): INR, PROTIME in the last 168 hours. Cardiac Enzymes: No results for input(s): CKTOTAL, CKMB, CKMBINDEX, TROPONINI in the last 168 hours. BNP (last 3 results) No results for input(s): PROBNP in the last 8760 hours. HbA1C: No results for input(s): HGBA1C in the last 72 hours. CBG: Recent Labs  Lab 05/22/20 0805 05/22/20 1151 05/22/20 1638 05/22/20 2108 05/23/20 0745  GLUCAP 282* 383* 353* 381* 208*   Lipid Profile: No results for input(s): CHOL, HDL, LDLCALC, TRIG, CHOLHDL, LDLDIRECT in the last 72 hours. Thyroid Function Tests: Recent Labs    05/21/20 0445  TSH 5.345*  FREET4 0.74   Anemia Panel: No results for input(s): VITAMINB12, FOLATE, FERRITIN, TIBC, IRON, RETICCTPCT in the last 72 hours. Sepsis Labs: Recent Labs  Lab 05/13/2020 1342  LATICACIDVEN 0.8    Recent Results (from the past 240 hour(s))  SARS Coronavirus 2 by RT PCR (hospital order, performed in [facility] hospital lab) Nasopharyngeal Nasopharyngeal Swab     Status: None   Collection Time: 05/16/20  3:23 PM   Specimen: Nasopharyngeal Swab  Result Value Ref Range Status   SARS Coronavirus 2 NEGATIVE NEGATIVE Final    Comment: (NOTE) SARS-CoV-2 target nucleic acids are NOT DETECTED.  The SARS-CoV-2 RNA is generally detectable in upper and lower respiratory specimens during the acute phase of infection. The lowest concentration of SARS-CoV-2 viral copies this assay can detect is 250 copies / mL. A negative result does not preclude SARS-CoV-2 infection and should not be used as the sole basis for treatment or other patient management decisions.  A negative result may occur with improper specimen  collection / handling, submission of specimen other than nasopharyngeal swab, presence of viral mutation(s) within the areas targeted by this assay, and inadequate number of viral copies (<250 copies / mL). A negative result must be combined with clinical observations, patient history, and epidemiological information.  Fact Sheet for Patients:   StrictlyIdeas.no  Fact Sheet for Healthcare Providers: BankingDealers.co.za  This test is not yet approved or  cleared by the [place] FDA and has been authorized for detection and/or diagnosis of SARS-CoV-2 by FDA under an Emergency Use Authorization (EUA).  This EUA will remain in effect (meaning this test can be used) for the duration of the COVID-19 declaration under Section 564(b)(1) of the Act, 21 U.S.C. section 360bbb-3(b)(1), unless the authorization is terminated or revoked sooner.  Performed at [facility], [address], [place], [place] [address]   SARS Coronavirus 2 by RT PCR (hospital order, performed in [facility] hospital lab) Nasopharyngeal Nasopharyngeal Swab     Status: None   Collection Time: 06/02/2020  1:20 PM  Specimen: Nasopharyngeal Swab  Result Value Ref Range Status   SARS Coronavirus 2 NEGATIVE NEGATIVE Final    Comment: (NOTE) SARS-CoV-2 target nucleic acids are NOT DETECTED.  The SARS-CoV-2 RNA is generally detectable in upper and lower respiratory specimens during the acute phase of infection. The lowest concentration of SARS-CoV-2 viral copies this assay can detect is 250 copies / mL. A negative result does not preclude SARS-CoV-2 infection and should not be used as the sole basis for treatment or other patient management decisions.  A negative result may occur with improper specimen collection / handling, submission of specimen other than nasopharyngeal swab, presence of viral mutation(s) within the areas targeted by this assay, and inadequate number of  viral copies (<250 copies / mL). A negative result must be combined with clinical observations, patient history, and epidemiological information.  Fact Sheet for Patients:   StrictlyIdeas.no  Fact Sheet for Healthcare Providers: BankingDealers.co.za  This test is not yet approved or  cleared by the [place] FDA and has been authorized for detection and/or diagnosis of SARS-CoV-2 by FDA under an Emergency Use Authorization (EUA).  This EUA will remain in effect (meaning this test can be used) for the duration of the COVID-19 declaration under Section 564(b)(1) of the Act, 21 U.S.C. section 360bbb-3(b)(1), unless the authorization is terminated or revoked sooner.  Performed at [facility], [address], [place], [place] [address]   Blood culture (routine x 2)     Status: None (Preliminary result)   Collection Time: 05/21/2020  1:42 PM   Specimen: BLOOD LEFT HAND  Result Value Ref Range Status   Specimen Description   Final    BLOOD LEFT HAND BOTTLES DRAWN AEROBIC AND ANAEROBIC   Special Requests Blood Culture adequate volume  Final   Culture   Final    NO GROWTH 3 DAYS Performed at [facility], [address], [place], [place] [address]    Report Status PENDING  Incomplete  Blood culture (routine x 2)     Status: None (Preliminary result)   Collection Time: 05/27/2020  1:43 PM   Specimen: Left Antecubital; Blood  Result Value Ref Range Status   Specimen Description   Final    LEFT ANTECUBITAL BOTTLES DRAWN AEROBIC AND ANAEROBIC   Special Requests Blood Culture adequate volume  Final   Culture   Final    NO GROWTH 3 DAYS Performed at [facility], [address], [place], [place] [address]    Report Status PENDING  Incomplete  MRSA PCR Screening     Status: None   Collection Time: 05/20/20  5:36 PM   Specimen: Nasopharyngeal  Result Value Ref Range Status   MRSA by PCR NEGATIVE NEGATIVE Final    Comment:        The  GeneXpert MRSA Assay (FDA approved for NASAL specimens only), is one component of a comprehensive MRSA colonization surveillance program. It is not intended to diagnose MRSA infection nor to guide or monitor treatment for MRSA infections. Performed at [facility], [address] [place], [place] [address]          Radiology Studies: No results found.      Scheduled Meds: . apixaban  5 mg Oral BID  . atorvastatin  80 mg Oral q1800  . clopidogrel  75 mg Oral Daily  . fluticasone  1 spray Each Nare Daily  . folic acid  1 mg Oral Daily  . gabapentin  300 mg Oral TID  . insulin aspart  0-20 Units Subcutaneous TID WC  . insulin aspart  0-5 Units Subcutaneous QHS  . insulin glargine  25 Units Subcutaneous QHS  . melatonin  3 mg Oral QHS  . metoprolol tartrate  25 mg Oral BID  . mometasone-formoterol  2 puff Inhalation BID  . primidone  100 mg Oral QHS  . sodium chloride flush  3 mL Intravenous Q12H   Continuous Infusions: . sodium chloride       LOS: 4 days    Time spent: 35 minutes    Chaseton Yepiz Darleen Crocker, DO [facility]  If 7PM-7AM, please contact night-coverage [email or web address] 05/23/2020, 11:12 AM

## 2020-05-24 ENCOUNTER — Inpatient Hospital Stay (HOSPITAL_COMMUNITY): Payer: Medicare HMO

## 2020-05-24 DIAGNOSIS — I3139 Other pericardial effusion (noninflammatory): Secondary | ICD-10-CM

## 2020-05-24 DIAGNOSIS — I313 Pericardial effusion (noninflammatory): Secondary | ICD-10-CM

## 2020-05-24 LAB — CULTURE, BLOOD (ROUTINE X 2)
Culture: NO GROWTH
Culture: NO GROWTH
Special Requests: ADEQUATE
Special Requests: ADEQUATE

## 2020-05-24 LAB — COMPREHENSIVE METABOLIC PANEL
ALT: 227 U/L — ABNORMAL HIGH (ref 0–44)
AST: 125 U/L — ABNORMAL HIGH (ref 15–41)
Albumin: 3.1 g/dL — ABNORMAL LOW (ref 3.5–5.0)
Alkaline Phosphatase: 134 U/L — ABNORMAL HIGH (ref 38–126)
Anion gap: 11 (ref 5–15)
BUN: 105 mg/dL — ABNORMAL HIGH (ref 8–23)
CO2: 25 mmol/L (ref 22–32)
Calcium: 8.8 mg/dL — ABNORMAL LOW (ref 8.9–10.3)
Chloride: 94 mmol/L — ABNORMAL LOW (ref 98–111)
Creatinine, Ser: 2.09 mg/dL — ABNORMAL HIGH (ref 0.61–1.24)
GFR calc Af Amer: 37 mL/min — ABNORMAL LOW (ref >60)
GFR calc non Af Amer: 32 mL/min — ABNORMAL LOW (ref >60)
Glucose, Bld: 247 mg/dL — ABNORMAL HIGH (ref 70–99)
Potassium: 4.7 mmol/L (ref 3.5–5.1)
Sodium: 130 mmol/L — ABNORMAL LOW (ref 135–145)
Total Bilirubin: 0.5 mg/dL (ref 0.3–1.2)
Total Protein: 6.3 g/dL — ABNORMAL LOW (ref 6.5–8.1)

## 2020-05-24 LAB — ECHOCARDIOGRAM LIMITED
Height: 72 in
Weight: 4084.68 oz

## 2020-05-24 LAB — MAGNESIUM: Magnesium: 2.6 mg/dL — ABNORMAL HIGH (ref 1.7–2.4)

## 2020-05-24 LAB — HEPARIN LEVEL (UNFRACTIONATED): Heparin Unfractionated: >2.2 IU/mL — ABNORMAL HIGH (ref 0.30–0.70)

## 2020-05-24 LAB — APTT: aPTT: 29 seconds (ref 24–36)

## 2020-05-24 LAB — GLUCOSE, CAPILLARY
Glucose-Capillary: 206 mg/dL — ABNORMAL HIGH (ref 70–99)
Glucose-Capillary: 338 mg/dL — ABNORMAL HIGH (ref 70–99)
Glucose-Capillary: 340 mg/dL — ABNORMAL HIGH (ref 70–99)
Glucose-Capillary: 263 mg/dL — ABNORMAL HIGH (ref 70–99)

## 2020-05-24 LAB — SEDIMENTATION RATE: Sed Rate: 5 mm/hr (ref 0–16)

## 2020-05-24 MED ORDER — SODIUM CHLORIDE 0.9 % IV SOLN: 250.0000 mL | INTRAVENOUS | Status: DC | PRN

## 2020-05-24 MED ORDER — FUROSEMIDE 10 MG/ML IJ SOLN
160.0000 mg | Freq: Three times a day (TID) | INTRAVENOUS | Status: DC
  Administered 2020-05-24 – 2020-05-26 (×4): 160 mg via INTRAVENOUS
  Filled 2020-05-24: qty 10
  Filled 2020-05-24 (×4): qty 16
  Filled 2020-05-24: qty 10
  Filled 2020-05-24 (×4): qty 16

## 2020-05-24 MED ORDER — SODIUM CHLORIDE 0.9% FLUSH
3.0000 mL | Freq: Two times a day (BID) | INTRAVENOUS | Status: DC
  Administered 2020-05-24: 3 mL via INTRAVENOUS

## 2020-05-24 MED ORDER — INSULIN GLARGINE 100 UNIT/ML ~~LOC~~ SOLN
20.0000 [IU] | Freq: Once | SUBCUTANEOUS | Status: AC
  Administered 2020-05-24: 20 [IU] via SUBCUTANEOUS
  Filled 2020-05-24 (×2): qty 0.2

## 2020-05-24 MED ORDER — SODIUM CHLORIDE 0.9 % IV SOLN: INTRAVENOUS | Status: DC

## 2020-05-24 MED ORDER — INSULIN ASPART 100 UNIT/ML ~~LOC~~ SOLN
0.0000 [IU] | SUBCUTANEOUS | Status: DC
  Administered 2020-05-24: 11 [IU] via SUBCUTANEOUS
  Administered 2020-05-25: 7 [IU] via SUBCUTANEOUS
  Administered 2020-05-25: 4 [IU] via SUBCUTANEOUS
  Administered 2020-05-25: 7 [IU] via SUBCUTANEOUS
  Administered 2020-05-25 – 2020-05-26 (×4): 4 [IU] via SUBCUTANEOUS
  Administered 2020-05-26: 20 [IU] via SUBCUTANEOUS

## 2020-05-24 MED ORDER — HEPARIN (PORCINE) 25000 UT/250ML-% IV SOLN
1400.0000 [IU]/h | INTRAVENOUS | Status: DC
  Administered 2020-05-24: 1400 [IU]/h via INTRAVENOUS
  Filled 2020-05-24: qty 250

## 2020-05-24 MED ORDER — INSULIN GLARGINE 100 UNIT/ML ~~LOC~~ SOLN
30.0000 [IU] | Freq: Every day | SUBCUTANEOUS | Status: DC
  Filled 2020-05-24: qty 0.3

## 2020-05-24 MED ORDER — INSULIN ASPART 100 UNIT/ML ~~LOC~~ SOLN
6.0000 [IU] | Freq: Three times a day (TID) | SUBCUTANEOUS | Status: DC
  Administered 2020-05-24 – 2020-06-04 (×31): 6 [IU] via SUBCUTANEOUS

## 2020-05-24 MED ORDER — SODIUM CHLORIDE 0.9% FLUSH: 3.0000 mL | INTRAVENOUS | Status: DC | PRN

## 2020-05-24 NOTE — Progress Notes (Signed)
Paracetensis procedure discussed in detail with patient and his wife. We discussed an overeal complication rate of <2 percent including but not limited to serious and immediate mechanical complications of pericardiocentesis including myocardial puncture or laceration, vascular injury (coronary, intercostal, internal mammary, or intraabdominal), pneumothorax, tension pneumopericardium, air embolism, and arrhythmia (ventricular and supraventricular). The patient understands risks of procedure and agrees to proceed.    Carlyle Dolly MD

## 2020-05-24 NOTE — Progress Notes (Signed)
Report called to Noell at [facility] on 6E.

## 2020-05-24 NOTE — Progress Notes (Addendum)
Progress Note  Patient Name: CORDAY WYKA Date of Encounter: 05/24/2020  Primary Cardiologist: Sanda Klein, MD  Subjective   Reports he still feels terrible and has not felt much improvement since admission. Still SOB with minimal activity and significant edema. He reports he thought there was talk of doing what sounds like a thoracentesis down at [facility] but I do not see this outlined in notes.  Inpatient Medications    Scheduled Meds: . apixaban  5 mg Oral BID  . atorvastatin  80 mg Oral q1800  . clopidogrel  75 mg Oral Daily  . fluticasone  1 spray Each Nare Daily  . folic acid  1 mg Oral Daily  . gabapentin  300 mg Oral TID  . insulin aspart  0-20 Units Subcutaneous TID WC  . insulin aspart  0-5 Units Subcutaneous QHS  . insulin aspart  6 Units Subcutaneous TID WC  . insulin glargine  30 Units Subcutaneous QHS  . melatonin  3 mg Oral QHS  . metoprolol tartrate  25 mg Oral BID  . mometasone-formoterol  2 puff Inhalation BID  . primidone  100 mg Oral QHS  . sodium chloride flush  3 mL Intravenous Q12H   Continuous Infusions: . sodium chloride    . furosemide     PRN Meds: sodium chloride, acetaminophen, diclofenac Sodium, ipratropium-albuterol, Muscle Rub, ondansetron (ZOFRAN) IV, sodium chloride flush   Vital Signs    Vitals:   05/23/20 2315 05/24/20 0512 05/24/20 0827 05/24/20 0851  BP: 126/83 (!) 118/99  117/69  Pulse: 71   60  Resp: 20 20  (!) 24  Temp: 98.2 F (36.8 C) 98.3 F (36.8 C)    TempSrc:  Oral    SpO2: 93%  90% 92%  Weight:  115.8 kg    Height:        Intake/Output Summary (Last 24 hours) at 05/24/2020 1020 Last data filed at 05/23/2020 2329 Gross per 24 hour  Intake --  Output 800 ml  Net -800 ml   Last 3 Weights 05/24/2020 05/23/2020 05/20/2020  Weight (lbs) 255 lb 4.7 oz 256 lb 4.8 oz 248 lb 10.9 oz  Weight (kg) 115.8 kg 116.257 kg 112.8 kg     Telemetry    Suspected atrial flutter with generally low voltage (rhythm appears similar  to admit) - Personally Reviewed  Physical Exam   GEN: No acute distress.  HEENT: Normocephalic, atraumatic, sclera non-icteric. Neck: Elevated JVD Cardiac: RRR no murmurs, rubs, or gallops.  Radials/DP/PT 1+ and equal bilaterally.  Respiratory: Diffusely diminished bilaterally. Breathing is unlabored. GI: Distended but nontender, BS +x 4. MS: no deformity. Extremities: No clubbing or cyanosis. Lower extremities and hands are cool with mottling of both shins and feet with 3+ BLE edema Neuro:  AAOx3. Follows commands. Psych:  Responds to questions appropriately with a normal affect.  Labs    High Sensitivity Troponin:   Recent Labs  Lab 05/16/20 1228 05/16/20 1436 05/14/2020 1310 05/29/2020 1653  TROPONINIHS 10 10 9 10       Cardiac EnzymesNo results for input(s): TROPONINI in the last 168 hours. No results for input(s): TROPIPOC in the last 168 hours.   Chemistry Recent Labs  Lab 05/21/20 0445 05/22/20 0840 05/23/20 0908  NA 133* 134* 128*  K 4.2 4.4 4.5  CL 99 97* 94*  CO2 26 28 25   GLUCOSE 171* 254* 273*  BUN 73* 82* 90*  CREATININE 1.58* 1.69* 1.99*  CALCIUM 8.3* 8.5* 8.4*  GFRNONAA 45* 41*  34*  GFRAA 52* 48* 39*  ANIONGAP 8 9 9      Hematology Recent Labs  Lab 05/20/20 0629 05/21/20 0445 05/23/20 0908  WBC 10.7* 8.8 9.8  RBC 4.49 4.57 4.77  HGB 13.9 14.0 14.8  HCT 43.3 44.3 46.1  MCV 96.4 96.9 96.6  MCH 31.0 30.6 31.0  MCHC 32.1 31.6 32.1  RDW 14.5 14.6 14.6  PLT 188 211 232    BNP Recent Labs  Lab 06/08/2020 1310  BNP 157.0*     DDimer No results for input(s): DDIMER in the last 168 hours.   Radiology    No results found.  Cardiac Studies   2D echo 05/2020   1. Left ventricular ejection fraction, by estimation, is approximately  35%. The left ventricle has moderately decreased function. Left  ventricular endocardial border not optimally defined to evaluate regional  wall motion. There is mild left ventricular  hypertrophy. Left  ventricular diastolic parameters are indeterminate.  2. Right ventricular systolic function is normal. The right ventricular  size is normal. Tricuspid regurgitation signal is inadequate for assessing  PA pressure.  3. Left atrial size was upper normal.  4. Moderate to large pericardial effusion. The pericardial effusion is  circumferential. There is undulation of the right atrial free wall,  intermittent diastolic compression of the right ventricular free wall (no  collapse), and respiratory variation of  mitral outflow consistent with hemodynamic significance, although not  diagnostic of tamponade (BP and HR noted above).  5. The mitral valve is grossly normal. No evidence of mitral valve  regurgitation.  6. The aortic valve is tricuspid. Aortic valve regurgitation is not  visualized.  7. The inferior vena cava is dilated in size with <50% respiratory  variability, suggesting right atrial pressure of 15 mmHg.   Patient Profile     69 y.o. 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 He was previously on anticoagulation for PAF but this was discontinued due to f/u heart monitoring showing NSR. He presented to [facility] with worsening SOB, weight gain and edema. Originally seen in ED 05/16/20 and sent home after Lasix but continued to worsen prompting return to ED 05/29/2020. On arrival found to be hypoxic and with development of coarse atrial fib versus atypical atrial flutter. He was also found to have new LV dysfunction with LVEF 35%, moderate-large pericardial effusion, and AKI on CKD stage III with poor UOP.  Assessment & Plan    1. Acute systolic CHF with new cardiomyopathy - etiology of declined LVEF unclear, possibly due to recurrent atrial arrhythmias but cannot exclude progression in coronary disease - nephrology now following and increasing  Lasix to 170m TID given lack of UOP and clinical improvement with previous dosing  - I am concerned about his physical examination and lack of clinical improvement and will discuss further management with MD (weight remains 27 pounds up from prior OP weight in April) - remains on low dose metoprolol at present time  2. Atrial fibrillation/flutter of unclear recurrent duration - rate appears controlled on telemetry - would likely benefit from consideration of cardioversion but would need to ensure no further procedures necessary - with patient's lack of clinical improvement, question whether we should hold Eliquis and transition to heparin in case procedures are needed  3. Moderate-large pericardial effusion - repeat echocardiogram read pending  4. Multivessel CAD s/p multiple prior  PCIs - Plavix has been continued given hx of stent thrombosis  - continue statin - hsTroponin negative  5. AKI on CKD stage III - nephrology now following given lack of clinical improvement and rising Cr  For questions or updates, please contact [place] Please consult [email or web address] for contact info under Cardiology/STEMI.  Signed, Charlie Pitter, PA-C 05/24/2020, 10:20 AM    Attending note  Patient seen and discussed with PA Dunn, I agree with her documentation. 69 yo male admitted with acute systolic HF (LVEF 12/4459 90-12%, 05/2020 admission LVEF 30-35%). Unclear etiology of cardiomyopathy, does have a prior history of CAD. Newly diagnossed afib during this admission.   Incomplete I/Os this admission, no significant weight loss. He has been on lasix 67m bid, just increased to 1680mtid by nephrology. Difficultly diuresing with uptrending Cr to 2.09 today, elevated liver enzyems.  Follow progress with high dose diuretics today -Baseline weight approximately 230 pounds and he is currently at 255  - would ask for RHC after pericardiocentesis as mentioned below. Would consider LHC with LVEF drop to look  at coronaries but would avoid now given his AKI.   - concern for possible low flow HF, pending filling pressures and CI may require inotropic support. Extremities are cool, difficultly diuresiing, worsening renal function.    Afib - from notes episode in 03/2019 in setting of covid - no recurrence by outpatient monitor, anticoag was stopped - recurrent afib this admission - he is on lopressor 2584mid for rate control -started on eliquiis this admit, will d/c in case invasive procedures become indicated, start hep gtt.    Pericardial effusion - large mod to large effusion, bordeline tamponade findings but no clinical tamponade though ongoing SOB.  - TSH 5.3, BUN on admit 49, CRP/ESR pending.  - f/u repeat limited echo today shows moderate to large effusion, most prominent anterior RV, early/pending tamponade signs - would consider [facility] nonemergent (he did receive eliquis 840 this AM and he is not in clinical tamponade to warrant procedure now). After pericardiocentesis would ask for RHC to assess filling pressures and CI, if low CI may need inotropic support to help assist diuresis.   CAD - no evidence of acute event - has been on long term plavix for history of stent thrombosis   Addendum Discussed case with Dr SmiTamala Juliano reviewed echo, effusion is large enough and approachable for pericardiocentesis. Has recommended patient be off eliquis 24-48 hrs prior to procedure, last dose was 840 AM today Sept 13. [facility] and also ask for RHC after to evaluate filling pressures and cardiac output due to concern for possible low flow HF as reported above. Will schedule for tomorrow, if clinically stable would defer to performing physician if prefer additional 24 hrs off eliquis for procedure. Stop heparin at 4AM.   JonCarlyle Dolly

## 2020-05-24 NOTE — Progress Notes (Signed)
Inpatient Diabetes Program Recommendations  AACE/ADA: New Consensus Statement on Inpatient Glycemic Control (2015)  Target Ranges:  Prepandial:   less than 140 mg/dL      Peak postprandial:   less than 180 mg/dL (1-2 hours)      Critically ill patients:  140 - 180 mg/dL   Lab Results  Component Value Date   GLUCAP 206 (H) 05/24/2020   HGBA1C 7.3 (H) 05/20/2020    Review of Glycemic Control Results for LOTT, SEELBACH (MRN [id number]) as of 05/24/2020 08:25  Ref. Range 05/23/2020 07:45 05/23/2020 11:16 05/23/2020 16:17 05/23/2020 21:09 05/24/2020 08:01  Glucose-Capillary Latest Ref Range: 70 - 99 mg/dL 208 (H) 350 (H) 341 (H) 370 (H) 206 (H)   Diabetes history: Type 2 DM Outpatient Diabetes medications:  Precose 100 mg tid Amaryl 2 mg daily Toujeo 25 units q HS  Current orders for Inpatient glycemic control:  Novolog resistant tid with meals and HS Lantus 25 units q HS Inpatient Diabetes Program Recommendations:    Please consider adding Novolog meal coverage 6 units tid with meals (hold if patient eats less than 50%).  Also consider adding Lantus 30 units q HS.   Thanks  Adah Perl, RN, BC-ADM Inpatient Diabetes Coordinator Pager [phone] (8a-5p)

## 2020-05-24 NOTE — Progress Notes (Signed)
ANTICOAGULATION CONSULT NOTE - Initial Consult  Pharmacy Consult for  Heparin dosing Indication: atrial fibrillation  Allergies  Allergen Reactions  . Ivp Dye [Iodinated Diagnostic Agents] Itching and Rash    Patient Measurements: Height: 6' (182.9 cm) Weight: 115.8 kg (255 lb 4.7 oz) IBW/kg (Calculated) : 77.6 Heparin Dosing Weight: HEPARIN DW (KG): 101.6  Vital Signs: Temp: 97.6 F (36.4 C) (09/13 1645) Temp Source: Oral (09/13 1645) BP: 124/59 (09/13 1645) Pulse Rate: 95 (09/13 1645)  Labs: Recent Labs    05/22/20 0840 05/23/20 0908 05/24/20 1005 05/24/20 1307  HGB  --  14.8  --   --   HCT  --  46.1  --   --   PLT  --  232  --   --   APTT  --   --   --  29  HEPARINUNFRC  --   --   --  >2.20*  CREATININE 1.69* 1.99* 2.09*  --     Estimated Creatinine Clearance: 45.1 mL/min (A) (by C-G formula based on SCr of 2.09 mg/dL (H)).   Medical History: Past Medical History:  Diagnosis Date  . Anxiety   . CHF (congestive heart failure) ([place])   . Collagen vascular disease ([place])   . COPD (chronic obstructive pulmonary disease) ([place])   . Coronary artery disease   . COVID-19   . Full dentures   . GERD (gastroesophageal reflux disease)    Rolaids as needed  . High cholesterol   . History of MI (myocardial infarction)   . Hypertension    med. dosage increased 04/2016; has been on BP med. x 10 yrs.  . Incarcerated umbilical hernia 05/8118  . Inguinal hernia 05/2016   bilateral   . Insulin dependent diabetes mellitus   . Ischemic cardiomyopathy    a. 03/2015 EF 40-45% by LV gram.  . Left leg cellulitis 10/08/2016  . Rheumatoid arthritis(714.0)      Assessment: Pharmacy consulted to dose heparin infusion for this 69 yo male with  atrial fibrillation. Patient has been taking Eliquis and his last dose was at 0840 this morning.  Will need to delay start of heparin infusion for 12 hours and monitor aPTTs until heparin level correlates with aPTT.   Baseline CBC is WNL.      Goal of Therapy:  Heparin level 0.3-0.7 units/ml Monitor platelets by anticoagulation protocol: Yes   Plan:  Start heparin infusion at 1400 units/hr at 2100 tonight. Stop at 4am tomorrow per Dr Harl Bowie for procedure.  Continue to monitor H&H and platelets  Donna Christen Melton Walls 05/24/2020,5:03 PM

## 2020-05-24 NOTE — H&P (Signed)
Contrast allergy Mod to large pericardial effusion without hypotension or paradox Eliquis given today Pericardiocentesis tomorrow late or on Wednesday Creat 2.1

## 2020-05-24 NOTE — Progress Notes (Signed)
Admit: 05/30/2020 LOS: 5  48M AoCKD3 2/2 decompensated AoC sCHF exacerbation  Subjective:  . Poor UOP on Lasix 80 TID . Rpt TTE this AM . Remains sig LEE  . AM Labs pending  09/12 0701 - 09/13 0700 In: 340 [P.O.:340] Out: 800 [Urine:800]  Filed Weights   05/20/20 0500 05/23/20 0500 05/24/20 0512  Weight: 112.8 kg 116.3 kg 115.8 kg    Scheduled Meds: . apixaban  5 mg Oral BID  . atorvastatin  80 mg Oral q1800  . clopidogrel  75 mg Oral Daily  . fluticasone  1 spray Each Nare Daily  . folic acid  1 mg Oral Daily  . furosemide  80 mg Intravenous TID  . gabapentin  300 mg Oral TID  . insulin aspart  0-20 Units Subcutaneous TID WC  . insulin aspart  0-5 Units Subcutaneous QHS  . insulin aspart  6 Units Subcutaneous TID WC  . insulin glargine  30 Units Subcutaneous QHS  . melatonin  3 mg Oral QHS  . metoprolol tartrate  25 mg Oral BID  . mometasone-formoterol  2 puff Inhalation BID  . primidone  100 mg Oral QHS  . sodium chloride flush  3 mL Intravenous Q12H   Continuous Infusions: . sodium chloride     PRN Meds:.sodium chloride, acetaminophen, diclofenac Sodium, ipratropium-albuterol, Muscle Rub, ondansetron (ZOFRAN) IV, sodium chloride flush  Current Labs: reviewed, AML pending   Physical Exam:  Blood pressure 117/69, pulse 60, temperature 98.3 F (36.8 C), temperature source Oral, resp. rate (!) 24, height 6' (1.829 m), weight 115.8 kg, SpO2 92 %. Obese, lying in bed 4+ LEE Cold LEs Regular, no rub Course on ant auscultation Distended abdomen  A 1. AoCKD3b from #2 2. AoC sCHF exacerbation, hypervoelmic, pericardial effusion 3. Hyponatremia, hypervolemic, from #2 4. DM2 5. COPD  P . Inc lasix to 160 IV TID . Might need to add metolazone . ? Inotrope, per cardiology . Fu AM Labs . 1.2L fluid restriction.   . AFib, rate control, on DOAC . Medication Issues; o Preferred narcotic agents for pain control are hydromorphone, fentanyl, and methadone. Morphine  should not be used.  o Baclofen should be avoided o Avoid oral sodium phosphate and magnesium citrate based laxatives / bowel preps    Pearson Grippe MD 05/24/2020, 9:21 AM  Recent Labs  Lab 05/21/20 0445 05/22/20 0840 05/23/20 0908  NA 133* 134* 128*  K 4.2 4.4 4.5  CL 99 97* 94*  CO2 26 28 25   GLUCOSE 171* 254* 273*  BUN 73* 82* 90*  CREATININE 1.58* 1.69* 1.99*  CALCIUM 8.3* 8.5* 8.4*   Recent Labs  Lab 06/03/2020 1310 05/24/2020 1310 05/20/20 0629 05/21/20 0445 05/23/20 0908  WBC 9.3   < > 10.7* 8.8 9.8  NEUTROABS 7.9*  --  9.0*  --   --   HGB 14.5   < > 13.9 14.0 14.8  HCT 44.7   < > 43.3 44.3 46.1  MCV 97.6   < > 96.4 96.9 96.6  PLT 182   < > 188 211 232   < > = values in this interval not displayed.

## 2020-05-24 NOTE — Progress Notes (Signed)
Orders written for RHC/pericardiocentesis per d/w Dr. Harl Bowie. I reviewed plan with Dr. Tamala Julian, no contrast is used in procedure so no need for pre-medication for procedure (pt has dye allergy). Dr. Harl Bowie requested heparin be stopped at 4am so I notified pharmacy team. Also discussed insulin dosing with IM since he will be NPO after midnight, although increased dosing was planned for tonight given CBGs>300. Dr. Manuella Ghazi will assist with adjusting regimen. Trystan Akhtar PA-C

## 2020-05-24 NOTE — Progress Notes (Signed)
*  PRELIMINARY RESULTS* Echocardiogram Limited 2-D Echocardiogram has been performed.  Robert [name] 05/24/2020, 9:24 AM

## 2020-05-24 NOTE — Progress Notes (Signed)
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 He usually takes about 80 mg of Lasix once a day. He has been compliant with his medication. He presented to the emergency department on September 5. He was given 2 doses of IV Lasix 80 mg each. He felt better. He was discharged home. However he has gained a total of about 8 pounds in the last week or so. He started developing shortness of breath in the last 24 hours and so he decided to come back to the hospital. Denies any chest pain dizziness lightheadedness. No syncopal episodes. No fever or chills. No nausea vomiting. hasalso noticed some swelling in his legs although that has not changed much in the last several days. Has history of orthopnea but no PND.  In the emergency department chest x-ray shows pulmonary edema. He was noted to be hypoxic saturating in the late 80s. He has oxygen at home but does not use it on a regular basis. Due to refractory symptoms he will need hospitalization for further management.  9/9:Patient admitted with acute diastolic heart failure. Seen by cardiology with plans to continue IV Lasix diuresis. Approximate 10-15 pound weight gain noted recently. Patient to be started on Eliquis.  9/10:2D echocardiogram on 9/9 revealing moderate to large pericardial effusion as well as LVEF of 35%. He has diuresed quite well overnight with -24-hour fluid balance of -2.6 L.Plan is to continue current diuresis with follow-up limited echo on  Monday.  9/11:Patient appears as though he is more euvolemic today, creatinine uptrending as well and blood pressures are softer. Will plan to hold Imdur and discontinue IV Lasix today. Metoprolol with holding parameters. Resume home oral Lasix and follow lab work. Wean oxygen as tolerated. Still on 3 L nasal cannula oxygen.  9/12: Patient noted to have worsening renal function this morning with very minimal urine output noted overnight.  Urine studies ordered for further evaluation.  He was given 1 dose of IV Lasix this morning, but after labs have returned, these have been discontinued.  He continues to remain volume overloaded.  Consult to nephrology for further evaluation appreciated.  9/13: Seen and evaluated by nephrology yesterday due to concern for worsening AKI along with significant volume overload and poor urine output.  Aggressive diuresis initiated with Lasix IV 3 times daily, now with dose up to 160mg .  Limited 2D echocardiogram performed as well with read pending as well as further recommendations from cardiology.  He is requiring increased amounts of oxygen today.  Assessment & Plan:   Principal Problem:   Acute CHF (congestive heart failure) (HCC) Active Problems:   COPD (chronic obstructive pulmonary disease) (HCC)   Controlled type 2 diabetes mellitus with diabetic peripheral angiopathy without gangrene, with long-term current use of insulin (HCC)   Acute respiratory failure with hypoxemia (HCC)   CKD (chronic kidney disease), stage III   Acute on chronic hypoxemic respiratory failure secondary to acute diastolic CHF exacerbation-worsening -Lasix dose increased to 160 mg IV 3 times daily per nephrology -Monitor intake and output -Wean oxygen down [place] air if tolerated -  Appreciate cardiology evaluation and inputby 9/13 -Baseline weight approximately 230 pounds and he is currently at 8 -May require inotropes or even metolazone to assist with further  diuresis  Moderate to large circumferential pericardial effusion -Patient will need ongoing close monitoring inpatient -Hold further diuresis on account of AKI -Limited 2D echocardiogram performed today with results pending  Secondary cardiomyopathy -Related to above -LVEF 35% with normal LVEF on 2D echocardiogram last year -Continue aggressive diuresis per nephrology  Hyponatremia likely in setting of volume overload-improving -Sodium currently 130 -Repeat labs in a.m. -TSH recently 5.345, and free T4 0.74  History of CAD -Multiple prior interventions with last cath 2018 -Not a good candidate for CABG -Continue aspirin and Plavix -Continue beta-blocker and statin  AKI on CKD stage IIIb  -Creatinine levelstarting to uptrend, now up to 2.09 -Patient still with considerable volume overload, now on aggressive IV Lasix per nephrology -Appreciate ongoing nephrology evaluation -Monitor intake and output -Recheck labs in a.m.  Insulin-dependent diabetes with peripheral neuropathy -Continue SSI as well as Lantus with doses increased given ongoing hyperglycemia -Continue to follow and adjust as needed -Hemoglobin A1c7.3%  History of COPD -DuoNebs as needed -No acute bronchospasms noted  Prior history of paroxysmal atrial fibrillation -Per cardiology, plan tocontinue on Eliquis, but may require heparin drip soon if planning procedures -CHA2DS2-VASc score 5   History of peripheral artery disease -Outpatient management  Essential hypertension-stable -Monitor carefully  while on aggressive IV diuresis -May require inotropic support if blood pressures decrease   DVT prophylaxis:Eliquis Code Status:Full Family Communication:Spoke with Daughter in law [name] [phone] on 9/13 Disposition Plan: Status is: Inpatient  Remains inpatient appropriate because:IV treatments appropriate due to intensity of illness or inability to take PO and Inpatient level of  care appropriate due to severity of illness   Dispo: The patient is from:Home Anticipated d/c is JJ:[id number] Anticipated d/c date is:>3days Patient currently is not medically stable to d/c.Patient requires ongoing IV Lasix diuresis.  Consultants:  Cardiology  Nephrology  Procedures:  See below  Antimicrobials:   None  Subjective: Patient seen and evaluated today with aggressive diuresis ordered, but with poor diuresis noted overnight.  He is on increasing amounts of oxygen as well.  2D echocardiogram performed this morning to reevaluate pericardial effusion.  Objective: Vitals:   05/23/20 2315 05/24/20 0512 05/24/20 0827 05/24/20 0851  BP: 126/83 (!) 118/99  117/69  Pulse: 71   60  Resp: 20 20  (!) 24  Temp: 98.2 F (36.8 C) 98.3 F (36.8 C)    TempSrc:  Oral    SpO2: 93%  90% 92%  Weight:  115.8 kg    Height:        Intake/Output Summary (Last 24 hours) at 05/24/2020 1144 Last data filed at 05/23/2020 2329 Gross per 24 hour  Intake --  Output 800 ml  Net -800 ml   Filed Weights   05/20/20 0500 05/23/20 0500 05/24/20 0512  Weight: 112.8 kg 116.3 kg 115.8 kg    Examination:  General exam: Appears calm and comfortable  Respiratory system: Clear to auscultation. Respiratory effort normal.  Currently on 5 L nasal cannula oxygen. Cardiovascular system: S1 & S2 heard, RRR.  Gastrointestinal system: Abdomen is nondistended, soft and nontender.  Central nervous system: Alert and oriented. No focal neurological deficits. Extremities: 2+ pitting edema bilaterally with cold extremities. Skin: No rashes, lesions or ulcers Psychiatry: Flat affect    Data Reviewed: I have personally reviewed following labs and imaging studies  CBC: Recent Labs  Lab 06/01/2020 1310 05/20/20 0629 05/21/20 0445 05/23/20 0908  WBC 9.3 10.7* 8.8 9.8  NEUTROABS 7.9* 9.0*  --   --   HGB 14.5 13.9 14.0 14.8  HCT 44.7 43.3 44.3 46.1   MCV 97.6 96.4 96.9 96.6  PLT 182 188 211 081   Basic Metabolic Panel: Recent Labs  Lab 05/20/20 0629 05/21/20 0445 05/22/20 0840 05/23/20 0908 05/24/20 1005  NA 135 133* 134* 128* 130*  K 4.3 4.2 4.4 4.5 4.7  CL 101 99 97* 94* 94*  CO2 24 26 28 25 25   GLUCOSE 175* 171* 254* 273* 247*  BUN 73* 73* 82* 90* 105*  CREATININE 1.71* 1.58* 1.69* 1.99* 2.09*  CALCIUM 8.8* 8.3* 8.5* 8.4* 8.8*  MG  --  2.6* 2.5* 2.5* 2.6*   GFR: Estimated Creatinine Clearance: 45.1 mL/min (A) (by C-G formula based on SCr of 2.09 mg/dL (H)). Liver Function Tests: Recent Labs  Lab 05/24/20 1005  AST 125*  ALT 227*  ALKPHOS 134*  BILITOT 0.5  PROT 6.3*  ALBUMIN 3.1*   No results for input(s): LIPASE, AMYLASE in the last 168 hours. No results for input(s): AMMONIA in the last 168 hours. Coagulation Profile: No results for input(s): INR, PROTIME in the last 168 hours. Cardiac Enzymes: No results for input(s): CKTOTAL, CKMB, CKMBINDEX, TROPONINI in the last 168 hours. BNP (last 3 results) No results for input(s): PROBNP in the last 8760 hours. HbA1C: No results for input(s): HGBA1C in the last 72 hours. CBG: Recent Labs  Lab 05/23/20 1116 05/23/20 1617 05/23/20 2109 05/24/20 0801 05/24/20 1122  GLUCAP 350* 341* 370* 206* 338*   Lipid Profile: No results for input(s): CHOL, HDL, LDLCALC, TRIG, CHOLHDL, LDLDIRECT in the last 72 hours. Thyroid Function Tests: No results for input(s): TSH, T4TOTAL, FREET4, T3FREE, THYROIDAB in the last 72 hours. Anemia Panel: No results for input(s): VITAMINB12, FOLATE, FERRITIN, TIBC, IRON, RETICCTPCT in the last 72 hours. Sepsis Labs: Recent Labs  Lab 06/04/2020 1342  LATICACIDVEN 0.8    Recent Results (from the past 240 hour(s))  SARS Coronavirus 2 by RT PCR (hospital order, performed in [facility] hospital lab) Nasopharyngeal Nasopharyngeal Swab     Status: None   Collection Time: 05/16/20  3:23 PM   Specimen: Nasopharyngeal Swab  Result Value  Ref Range Status   SARS Coronavirus 2 NEGATIVE NEGATIVE Final    Comment: (NOTE) SARS-CoV-2 target nucleic acids are NOT DETECTED.  The SARS-CoV-2 RNA is generally detectable in upper and lower respiratory specimens during the acute phase of infection. The lowest concentration of SARS-CoV-2 viral copies this assay can detect is 250 copies / mL. A negative result does not preclude SARS-CoV-2 infection and should not be used as the sole basis for treatment or other patient management decisions.  A negative result may occur with improper specimen collection / handling, submission of specimen other than nasopharyngeal swab, presence of viral mutation(s) within the areas targeted by this assay, and inadequate number of viral copies (<250 copies / mL). A negative result must be combined with clinical observations, patient history, and epidemiological information.  Fact Sheet for Patients:   StrictlyIdeas.no  Fact Sheet for Healthcare Providers: BankingDealers.co.za  This test is not yet approved or  cleared by the [place] FDA and has been authorized for detection and/or diagnosis of SARS-CoV-2 by FDA under an Emergency Use Authorization (EUA).  This EUA will remain in effect (meaning this test can be used) for the duration of the COVID-19 declaration under Section 564(b)(1)  of the Act, 21 U.S.C. section 360bbb-3(b)(1), unless the authorization is terminated or revoked sooner.  Performed at [facility], [address], [place], [place] [address]   SARS Coronavirus 2 by RT PCR (hospital order, performed in [facility] hospital lab) Nasopharyngeal Nasopharyngeal Swab     Status: None   Collection Time: 05/12/2020  1:20 PM   Specimen: Nasopharyngeal Swab  Result Value Ref Range Status   SARS Coronavirus 2 NEGATIVE NEGATIVE Final    Comment: (NOTE) SARS-CoV-2 target nucleic acids are NOT DETECTED.  The SARS-CoV-2 RNA is generally  detectable in upper and lower respiratory specimens during the acute phase of infection. The lowest concentration of SARS-CoV-2 viral copies this assay can detect is 250 copies / mL. A negative result does not preclude SARS-CoV-2 infection and should not be used as the sole basis for treatment or other patient management decisions.  A negative result may occur with improper specimen collection / handling, submission of specimen other than nasopharyngeal swab, presence of viral mutation(s) within the areas targeted by this assay, and inadequate number of viral copies (<250 copies / mL). A negative result must be combined with clinical observations, patient history, and epidemiological information.  Fact Sheet for Patients:   StrictlyIdeas.no  Fact Sheet for Healthcare Providers: BankingDealers.co.za  This test is not yet approved or  cleared by the [place] FDA and has been authorized for detection and/or diagnosis of SARS-CoV-2 by FDA under an Emergency Use Authorization (EUA).  This EUA will remain in effect (meaning this test can be used) for the duration of the COVID-19 declaration under Section 564(b)(1) of the Act, 21 U.S.C. section 360bbb-3(b)(1), unless the authorization is terminated or revoked sooner.  Performed at [facility], [address], [place], [place] [address]   Blood culture (routine x 2)     Status: None (Preliminary result)   Collection Time: 05/12/2020  1:42 PM   Specimen: BLOOD LEFT HAND  Result Value Ref Range Status   Specimen Description   Final    BLOOD LEFT HAND BOTTLES DRAWN AEROBIC AND ANAEROBIC   Special Requests Blood Culture adequate volume  Final   Culture   Final    NO GROWTH 3 DAYS Performed at [facility], [address], [place], [place] [address]    Report Status PENDING  Incomplete  Blood culture (routine x 2)     Status: None (Preliminary result)   Collection Time: 05/29/2020  1:43 PM    Specimen: Left Antecubital; Blood  Result Value Ref Range Status   Specimen Description   Final    LEFT ANTECUBITAL BOTTLES DRAWN AEROBIC AND ANAEROBIC   Special Requests Blood Culture adequate volume  Final   Culture   Final    NO GROWTH 3 DAYS Performed at [facility], [address], [place], [place] [address]    Report Status PENDING  Incomplete  MRSA PCR Screening     Status: None   Collection Time: 05/20/20  5:36 PM   Specimen: Nasopharyngeal  Result Value Ref Range Status   MRSA by PCR NEGATIVE NEGATIVE Final    Comment:        The GeneXpert MRSA Assay (FDA approved for NASAL specimens only), is one component of a comprehensive MRSA colonization surveillance program. It is not intended to diagnose MRSA infection nor to guide or monitor treatment for MRSA infections. Performed at [facility], [address] [place], [place] [address]          Radiology Studies: No results  found.      Scheduled Meds: . apixaban  5 mg Oral BID  . atorvastatin  80 mg Oral q1800  . clopidogrel  75 mg Oral Daily  . fluticasone  1 spray Each Nare Daily  . folic acid  1 mg Oral Daily  . gabapentin  300 mg Oral TID  . insulin aspart  0-20 Units Subcutaneous TID WC  . insulin aspart  0-5 Units Subcutaneous QHS  . insulin aspart  6 Units Subcutaneous TID WC  . insulin glargine  30 Units Subcutaneous QHS  . melatonin  3 mg Oral QHS  . metoprolol tartrate  25 mg Oral BID  . mometasone-formoterol  2 puff Inhalation BID  . primidone  100 mg Oral QHS  . sodium chloride flush  3 mL Intravenous Q12H   Continuous Infusions: . sodium chloride    . furosemide       LOS: 5 days    Time spent: 35 minutes    Jaylyne Breese Darleen Crocker, DO [facility]  If 7PM-7AM, please contact night-coverage [email or web address] 05/24/2020, 11:44 AM

## 2020-05-24 NOTE — Progress Notes (Signed)
°   05/24/20 2113  Assess: MEWS Score  Temp 97.9 F (36.6 C)  BP 126/84  Pulse Rate (!) 113  ECG Heart Rate (!) 113  Resp (!) 22  SpO2 95 %  O2 Device Nasal Cannula  O2 Flow Rate (L/min) 5 L/min  Assess: MEWS Score  MEWS Temp 0  MEWS Systolic 0  MEWS Pulse 2  MEWS RR 1  MEWS LOC 0  MEWS Score 3  MEWS Score Color Yellow  Assess: if the MEWS score is Yellow or Red  Were vital signs taken at a resting state? Yes  Focused Assessment Change from prior assessment (see assessment flowsheet)  Early Detection of Sepsis Score *See Row Information* Medium  MEWS guidelines implemented *See Row Information* Yes  Treat  MEWS Interventions Administered scheduled meds/treatments (scheduled metoprolol)  Take Vital Signs  Increase Vital Sign Frequency  Yellow: Q 2hr X 2 then Q 4hr X 2, if remains yellow, continue Q 4hrs  Escalate  MEWS: Escalate Yellow: discuss with charge nurse/RN and consider discussing with provider and RRT  Notify: Charge Nurse/RN  Name of Charge Nurse/RN Notified Christina   Date Charge Nurse/RN Notified 05/24/20  Time Charge Nurse/RN Notified 2335  Document  Patient Outcome Other (Comment) (continue increased vital sign monitoring )  Progress note created (see row info) Yes

## 2020-05-24 NOTE — Progress Notes (Signed)
ANTICOAGULATION CONSULT NOTE - Initial Consult  Pharmacy Consult for  Heparin dosing Indication: atrial fibrillation  Allergies  Allergen Reactions  . Ivp Dye [Iodinated Diagnostic Agents] Itching and Rash    Patient Measurements: Height: 6' (182.9 cm) Weight: 115.8 kg (255 lb 4.7 oz) IBW/kg (Calculated) : 77.6 Heparin Dosing Weight: HEPARIN DW (KG): 101.6  Vital Signs: Temp: 98.3 F (36.8 C) (09/13 0512) Temp Source: Oral (09/13 0512) BP: 117/69 (09/13 0851) Pulse Rate: 60 (09/13 0851)  Labs: Recent Labs    05/22/20 0840 05/23/20 0908 05/24/20 1005  HGB  --  14.8  --   HCT  --  46.1  --   PLT  --  232  --   CREATININE 1.69* 1.99* 2.09*    Estimated Creatinine Clearance: 45.1 mL/min (A) (by C-G formula based on SCr of 2.09 mg/dL (H)).   Medical History: Past Medical History:  Diagnosis Date  . Anxiety   . CHF (congestive heart failure) ([place])   . Collagen vascular disease ([place])   . COPD (chronic obstructive pulmonary disease) ([place])   . Coronary artery disease   . COVID-19   . Full dentures   . GERD (gastroesophageal reflux disease)    Rolaids as needed  . High cholesterol   . History of MI (myocardial infarction)   . Hypertension    med. dosage increased 04/2016; has been on BP med. x 10 yrs.  . Incarcerated umbilical hernia [id number]/3734  . Inguinal hernia 05/2016   bilateral   . Insulin dependent diabetes mellitus   . Ischemic cardiomyopathy    a. 03/2015 EF 40-45% by LV gram.  . Left leg cellulitis 10/08/2016  . Rheumatoid arthritis(714.0)      Assessment: Pharmacy consulted to dose heparin infusion for this 69 yo male with  atrial fibrillation. Patient has been taking Eliquis and his last dose was at 0840 this morning.  Will need to delay start of heparin infusion for 12 hours and monitor aPTTs until heparin level correlates with aPTT.   Baseline CBC is WNL.     Goal of Therapy:  Heparin level 0.3-0.7 units/ml Monitor platelets by anticoagulation  protocol: Yes   Plan:  Start heparin infusion at 1400 units/hr at 2100 tonight Check first aPTT at 0500 on 05/24/2020 Check anti-Xa level, CBC and aPTT daily while on heparin Continue to monitor H&H and platelets  Despina Pole 05/24/2020,12:30 PM

## 2020-05-25 ENCOUNTER — Inpatient Hospital Stay (HOSPITAL_COMMUNITY): Payer: Medicare HMO

## 2020-05-25 ENCOUNTER — Encounter (HOSPITAL_COMMUNITY): Admission: EM | Disposition: E | Payer: Self-pay | Source: Home / Self Care | Attending: Internal Medicine

## 2020-05-25 ENCOUNTER — Encounter (HOSPITAL_COMMUNITY): Payer: Self-pay | Admitting: Interventional Cardiology

## 2020-05-25 DIAGNOSIS — I2510 Atherosclerotic heart disease of native coronary artery without angina pectoris: Secondary | ICD-10-CM

## 2020-05-25 DIAGNOSIS — N179 Acute kidney failure, unspecified: Secondary | ICD-10-CM

## 2020-05-25 DIAGNOSIS — I313 Pericardial effusion (noninflammatory): Secondary | ICD-10-CM

## 2020-05-25 DIAGNOSIS — J9601 Acute respiratory failure with hypoxia: Secondary | ICD-10-CM

## 2020-05-25 DIAGNOSIS — I509 Heart failure, unspecified: Secondary | ICD-10-CM

## 2020-05-25 DIAGNOSIS — I5041 Acute combined systolic (congestive) and diastolic (congestive) heart failure: Secondary | ICD-10-CM

## 2020-05-25 DIAGNOSIS — Z8616 Personal history of COVID-19: Secondary | ICD-10-CM

## 2020-05-25 DIAGNOSIS — I314 Cardiac tamponade: Secondary | ICD-10-CM

## 2020-05-25 DIAGNOSIS — N1832 Chronic kidney disease, stage 3b: Secondary | ICD-10-CM

## 2020-05-25 DIAGNOSIS — I484 Atypical atrial flutter: Secondary | ICD-10-CM

## 2020-05-25 HISTORY — PX: PERICARDIOCENTESIS: CATH118255

## 2020-05-25 HISTORY — PX: RIGHT HEART CATH: CATH118263

## 2020-05-25 LAB — BASIC METABOLIC PANEL
Anion gap: 10 (ref 5–15)
BUN: 118 mg/dL — ABNORMAL HIGH (ref 8–23)
CO2: 25 mmol/L (ref 22–32)
Calcium: 8.6 mg/dL — ABNORMAL LOW (ref 8.9–10.3)
Chloride: 95 mmol/L — ABNORMAL LOW (ref 98–111)
Creatinine, Ser: 2.18 mg/dL — ABNORMAL HIGH (ref 0.61–1.24)
GFR calc Af Amer: 35 mL/min — ABNORMAL LOW (ref >60)
GFR calc non Af Amer: 30 mL/min — ABNORMAL LOW (ref >60)
Glucose, Bld: 154 mg/dL — ABNORMAL HIGH (ref 70–99)
Potassium: 4.8 mmol/L (ref 3.5–5.1)
Sodium: 130 mmol/L — ABNORMAL LOW (ref 135–145)

## 2020-05-25 LAB — BODY FLUID CELL COUNT WITH DIFFERENTIAL
Eos, Fluid: 5 %
Lymphs, Fluid: 8 %
Monocyte-Macrophage-Serous Fluid: 4 % — ABNORMAL LOW (ref 50–90)
Neutrophil Count, Fluid: 83 % — ABNORMAL HIGH (ref 0–25)
Total Nucleated Cell Count, Fluid: 2244 cu mm — ABNORMAL HIGH (ref 0–1000)

## 2020-05-25 LAB — CBC
HCT: 44 % (ref 39.0–52.0)
Hemoglobin: 14.3 g/dL (ref 13.0–17.0)
MCH: 30.8 pg (ref 26.0–34.0)
MCHC: 32.5 g/dL (ref 30.0–36.0)
MCV: 94.8 fL (ref 80.0–100.0)
Platelets: 268 10*3/uL (ref 150–400)
RBC: 4.64 MIL/uL (ref 4.22–5.81)
RDW: 14.6 % (ref 11.5–15.5)
WBC: 11 10*3/uL — ABNORMAL HIGH (ref 4.0–10.5)
nRBC: 0 % (ref 0.0–0.2)

## 2020-05-25 LAB — POCT I-STAT EG7
Acid-base deficit: 2 mmol/L (ref 0.0–2.0)
Acid-base deficit: 3 mmol/L — ABNORMAL HIGH (ref 0.0–2.0)
Bicarbonate: 26.3 mmol/L (ref 20.0–28.0)
Bicarbonate: 26.9 mmol/L (ref 20.0–28.0)
Calcium, Ion: 1.22 mmol/L (ref 1.15–1.40)
Calcium, Ion: 1.24 mmol/L (ref 1.15–1.40)
HCT: 43 % (ref 39.0–52.0)
HCT: 44 % (ref 39.0–52.0)
Hemoglobin: 14.6 g/dL (ref 13.0–17.0)
Hemoglobin: 15 g/dL (ref 13.0–17.0)
O2 Saturation: 64 %
O2 Saturation: 64 %
Potassium: 5 mmol/L (ref 3.5–5.1)
Potassium: 5 mmol/L (ref 3.5–5.1)
Sodium: 133 mmol/L — ABNORMAL LOW (ref 135–145)
Sodium: 134 mmol/L — ABNORMAL LOW (ref 135–145)
TCO2: 28 mmol/L (ref 22–32)
TCO2: 29 mmol/L (ref 22–32)
pCO2, Ven: 62.2 mmHg — ABNORMAL HIGH (ref 44.0–60.0)
pCO2, Ven: 62.6 mmHg — ABNORMAL HIGH (ref 44.0–60.0)
pH, Ven: 7.234 — ABNORMAL LOW (ref 7.250–7.430)
pH, Ven: 7.241 — ABNORMAL LOW (ref 7.250–7.430)
pO2, Ven: 40 mmHg (ref 32.0–45.0)
pO2, Ven: 40 mmHg (ref 32.0–45.0)

## 2020-05-25 LAB — ECHOCARDIOGRAM LIMITED
Height: 72 in
Height: 72 in
Weight: 4144.65 oz
Weight: 4144.65 oz

## 2020-05-25 LAB — GLUCOSE, CAPILLARY
Glucose-Capillary: 110 mg/dL — ABNORMAL HIGH (ref 70–99)
Glucose-Capillary: 178 mg/dL — ABNORMAL HIGH (ref 70–99)
Glucose-Capillary: 180 mg/dL — ABNORMAL HIGH (ref 70–99)
Glucose-Capillary: 220 mg/dL — ABNORMAL HIGH (ref 70–99)
Glucose-Capillary: 241 mg/dL — ABNORMAL HIGH (ref 70–99)

## 2020-05-25 LAB — HIGH SENSITIVITY CRP: CRP, High Sensitivity: 6.49 mg/L — ABNORMAL HIGH (ref 0.00–3.00)

## 2020-05-25 SURGERY — RIGHT HEART CATH

## 2020-05-25 MED ORDER — DIPHENHYDRAMINE HCL 50 MG/ML IJ SOLN: 25.0000 mg | Freq: Once | INTRAMUSCULAR | Status: DC

## 2020-05-25 MED ORDER — LIDOCAINE HCL (PF) 1 % IJ SOLN
INTRAMUSCULAR | Status: DC | PRN
  Administered 2020-05-25: 10 mL
  Administered 2020-05-25: 2 mL

## 2020-05-25 MED ORDER — SODIUM CHLORIDE 0.9% FLUSH
3.0000 mL | INTRAVENOUS | Status: DC | PRN
  Administered 2020-06-03 (×2): 3 mL via INTRAVENOUS

## 2020-05-25 MED ORDER — HEPARIN (PORCINE) IN NACL 1000-0.9 UT/500ML-% IV SOLN
INTRAVENOUS | Status: DC | PRN
  Administered 2020-05-25: 500 mL

## 2020-05-25 MED ORDER — HEPARIN SODIUM (PORCINE) 5000 UNIT/ML IJ SOLN
5000.0000 [IU] | Freq: Three times a day (TID) | INTRAMUSCULAR | Status: DC
  Administered 2020-05-25 – 2020-05-28 (×8): 5000 [IU] via SUBCUTANEOUS
  Filled 2020-05-25 (×8): qty 1

## 2020-05-25 MED ORDER — METHYLPREDNISOLONE SODIUM SUCC 125 MG IJ SOLR: 125.0000 mg | Freq: Once | INTRAMUSCULAR | Status: DC

## 2020-05-25 MED ORDER — INFLUENZA VAC A&B SA ADJ QUAD 0.5 ML IM PRSY
0.5000 mL | PREFILLED_SYRINGE | INTRAMUSCULAR | Status: DC
  Filled 2020-05-25: qty 0.5

## 2020-05-25 MED ORDER — DIPHENHYDRAMINE HCL 50 MG/ML IJ SOLN
25.0000 mg | Freq: Once | INTRAMUSCULAR | Status: AC
  Administered 2020-05-25: 25 mg via INTRAVENOUS
  Filled 2020-05-25: qty 1

## 2020-05-25 MED ORDER — ONDANSETRON HCL 4 MG/2ML IJ SOLN: 4.0000 mg | Freq: Four times a day (QID) | INTRAMUSCULAR | Status: DC | PRN

## 2020-05-25 MED ORDER — LABETALOL HCL 5 MG/ML IV SOLN: 10.0000 mg | INTRAVENOUS | Status: AC | PRN

## 2020-05-25 MED ORDER — LIDOCAINE HCL (PF) 1 % IJ SOLN
INTRAMUSCULAR | Status: AC
  Filled 2020-05-25: qty 30

## 2020-05-25 MED ORDER — SODIUM CHLORIDE 0.9 % IV SOLN: 250.0000 mL | INTRAVENOUS | Status: DC | PRN

## 2020-05-25 MED ORDER — HEPARIN (PORCINE) IN NACL 1000-0.9 UT/500ML-% IV SOLN
INTRAVENOUS | Status: AC
  Filled 2020-05-25: qty 500

## 2020-05-25 MED ORDER — CHLORHEXIDINE GLUCONATE CLOTH 2 % EX PADS
6.0000 | MEDICATED_PAD | Freq: Every day | CUTANEOUS | Status: DC
  Administered 2020-05-25 – 2020-06-15 (×19): 6 via TOPICAL

## 2020-05-25 MED ORDER — HYDRALAZINE HCL 20 MG/ML IJ SOLN: 10.0000 mg | INTRAMUSCULAR | Status: AC | PRN

## 2020-05-25 MED ORDER — OXYCODONE HCL 5 MG PO TABS
5.0000 mg | ORAL_TABLET | ORAL | Status: DC | PRN
  Administered 2020-05-26: 5 mg via ORAL
  Administered 2020-05-27: 10 mg via ORAL
  Administered 2020-05-30 – 2020-06-13 (×10): 5 mg via ORAL
  Filled 2020-05-25 (×5): qty 1
  Filled 2020-05-25: qty 2
  Filled 2020-05-25 (×6): qty 1
  Filled 2020-05-25: qty 2

## 2020-05-25 MED ORDER — ACETAMINOPHEN 325 MG PO TABS: 650.0000 mg | ORAL_TABLET | ORAL | Status: DC | PRN

## 2020-05-25 MED ORDER — SODIUM CHLORIDE 0.9% FLUSH
3.0000 mL | Freq: Two times a day (BID) | INTRAVENOUS | Status: DC
  Administered 2020-05-25 – 2020-06-06 (×23): 3 mL via INTRAVENOUS

## 2020-05-25 MED ORDER — METHYLPREDNISOLONE SODIUM SUCC 125 MG IJ SOLR
125.0000 mg | Freq: Once | INTRAMUSCULAR | Status: AC
  Administered 2020-05-25: 125 mg via INTRAVENOUS
  Filled 2020-05-25: qty 2

## 2020-05-25 SURGICAL SUPPLY — 14 items
CABLE SURGICAL S-101-97-12 (CABLE) ×2 IMPLANT
CATH SWAN DBL LUMAN 5F 110 (CATHETERS) ×2 IMPLANT
CATH SWAN GANZ 7F STRAIGHT (CATHETERS) ×2 IMPLANT
GLIDESHEATH SLEND SS 6F .021 (SHEATH) ×2 IMPLANT
GLIDESHEATH SLENDER 7FR .021G (SHEATH) ×2 IMPLANT
GUIDEWIRE .025 260CM (WIRE) ×2 IMPLANT
KIT PERIVAC ST CATH 8.3FR (TRAY / TRAY PROCEDURE) ×2 IMPLANT
PACK CARDIAC CATHETERIZATION (CUSTOM PROCEDURE TRAY) ×2 IMPLANT
PROTECTION STATION PRESSURIZED (MISCELLANEOUS) ×3
SHEATH PINNACLE 6F 10CM (SHEATH) ×2 IMPLANT
SHEATH PROBE COVER 6X72 (BAG) ×2 IMPLANT
STATION PROTECTION PRESSURIZED (MISCELLANEOUS) IMPLANT
TRANSDUCER W/STOPCOCK (MISCELLANEOUS) ×2 IMPLANT
WIRE EMERALD 3MM-J .035X150CM (WIRE) ×2 IMPLANT

## 2020-05-25 NOTE — Progress Notes (Addendum)
INTERVENTIONAL CARDIOLOGY NOTE   After discussing with team members including Dr. Kipp Brood and Croitoru, will proceed with percutaneous pericardial drainage this morning.  Will perform right heart cath via right antecubital vein prior to placing the pericardial drain.  He is at increased risk for bleeding complications due to body habitus and concommitant anticoagulation and antiplatelet therapy.  This has been discussed in depth with the patient and his son [name].  All seem to understand the importance of the procedure and that risk of complications is heightened.

## 2020-05-25 NOTE — Progress Notes (Signed)
Progress Note  Patient Name: Robert [name] Date of Encounter: 06/03/2020  [facility] Cardiologist: Sanda Klein, MD   Subjective   Severe orthopnea. No chest pain. Worsened pedal edema. Borderline tachycardic (90s) and hypotensive (90-100/70s).  Inpatient Medications    Scheduled Meds:  atorvastatin  80 mg Oral q1800   diphenhydrAMINE  25 mg Intravenous Once   fluticasone  1 spray Each Nare Daily   folic acid  1 mg Oral Daily   gabapentin  300 mg Oral TID   insulin aspart  0-20 Units Subcutaneous Q4H   insulin aspart  6 Units Subcutaneous TID WC   melatonin  3 mg Oral QHS   methylPREDNISolone sodium succinate  125 mg Intravenous Once   metoprolol tartrate  25 mg Oral BID   mometasone-formoterol  2 puff Inhalation BID   primidone  100 mg Oral QHS   sodium chloride flush  3 mL Intravenous Q12H   sodium chloride flush  3 mL Intravenous Q12H   Continuous Infusions:  sodium chloride     sodium chloride     sodium chloride 10 mL/hr at 05/24/2020 0432   furosemide 160 mg (05/24/20 2125)   PRN Meds: sodium chloride, sodium chloride, acetaminophen, diclofenac Sodium, ipratropium-albuterol, Muscle Rub, ondansetron (ZOFRAN) IV, sodium chloride flush, sodium chloride flush   Vital Signs    Vitals:   05/31/2020 0216 06/01/2020 0358 06/03/2020 0424 05/30/2020 0813  BP: 94/68 105/72  104/72  Pulse: 94 (!) 114  95  Resp: (!) 24 (!) 22  20  Temp: 98.4 F (36.9 C) 98.4 F (36.9 C)  98.1 F (36.7 C)  TempSrc: Oral Oral  Oral  SpO2: 93% 90% 93% 97%  Weight:  117.5 kg    Height:        Intake/Output Summary (Last 24 hours) at 05/17/2020 0943 Last data filed at 05/19/2020 0417 Gross per 24 hour  Intake 829.79 ml  Output 400 ml  Net 429.79 ml   Last 3 Weights 06/05/2020 05/24/2020 05/24/2020  Weight (lbs) 259 lb 0.7 oz 258 lb 255 lb 4.7 oz  Weight (kg) 117.5 kg 117.028 kg 115.8 kg      Telemetry    Regular rhythm, probably atypical AFlutter with 3:1 AV  block, but could be sinus, very hard to see P waves - Personally Reviewed  ECG    Probably atypical atrial flutter low voltage, TWI I, aVL, V1-V4, poor R wave progression - Personally Reviewed  Physical Exam  Obese GEN: No acute distress.   Neck: JVD to angle of jaw Cardiac: very distant heart sounds, RRR, no murmurs, rubs, or gallops.  Respiratory: A few rales in left base, otherwise clear to auscultation bilaterally. GI: Soft, nontender, distended (chronically) MS: 3+ symmetrical dependent edema; No deformity. Neuro:  Nonfocal  Psych: Normal affect   Labs    High Sensitivity Troponin:   Recent Labs  Lab 05/16/20 1228 05/16/20 1436 06/08/2020 1310 05/24/2020 1653  TROPONINIHS 10 10 9 10       Chemistry Recent Labs  Lab 05/22/20 0840 05/23/20 0908 05/24/20 1005  NA 134* 128* 130*  K 4.4 4.5 4.7  CL 97* 94* 94*  CO2 28 25 25   GLUCOSE 254* 273* 247*  BUN 82* 90* 105*  CREATININE 1.69* 1.99* 2.09*  CALCIUM 8.5* 8.4* 8.8*  PROT  --   --  6.3*  ALBUMIN  --   --  3.1*  AST  --   --  125*  ALT  --   --  227*  ALKPHOS  --   --  134*  BILITOT  --   --  0.5  GFRNONAA 41* 34* 32*  GFRAA 48* 39* 37*  ANIONGAP 9 9 11      Hematology Recent Labs  Lab 05/20/20 0629 05/21/20 0445 05/23/20 0908  WBC 10.7* 8.8 9.8  RBC 4.49 4.57 4.77  HGB 13.9 14.0 14.8  HCT 43.3 44.3 46.1  MCV 96.4 96.9 96.6  MCH 31.0 30.6 31.0  MCHC 32.1 31.6 32.1  RDW 14.5 14.6 14.6  PLT 188 211 232    BNP Recent Labs  Lab 06/06/2020 1310  BNP 157.0*     DDimer No results for input(s): DDIMER in the last 168 hours.   Radiology    ECHOCARDIOGRAM LIMITED  Result Date: 05/24/2020    ECHOCARDIOGRAM LIMITED REPORT   Patient Name:   Robert [name] Date of Exam: 05/24/2020 Medical Rec #:  [id number]      Height:       72.0 in Accession #:    [id number]     Weight:       255.3 lb Date of Birth:  07-27-1951      BSA:          2.363 m Patient Age:    44 years       BP:           118/99 mmHg Patient  Gender: M              HR:           106 bpm. Exam Location:  [facility] Procedure: Limited Echo and Cardiac Doppler Indications:    Pericardial effusion 423.9 / I31.3  History:        Patient has prior history of Echocardiogram examinations, most                 recent 05/20/2020. Previous Myocardial Infarction, COPD; Risk                 Factors:Hypertension, Diabetes, Dyslipidemia and Current Smoker.                 Acute Respiratory Failure.  Sonographer:    Alvino Chapel RCS Referring Phys: [place]  1. Left ventricular ejection fraction, by estimation, is 35%. The left ventricle has moderately decreased function.  2. Overall oderate to large circumferential pericardial effusion.The effusion is large and most prominent anterior to the RV measuring 3.1 cm. Difficult to assess respiratory variation. Poor telemetry tracing, the inflow waves have irregular intervals, cannot distinguish if the respiratory variation is solely secondary to the irregular rhythm. There is RA diastolic indentation without clear collapse. There is indentation of the RV with some restriction of diastolic filling without frank collapse. Findings stable compared to 05/20/20 study.  3. The inferior vena cava is dilated in size with <50% respiratory variability, suggesting right atrial pressure of 15 mmHg. FINDINGS  Left Ventricle: Left ventricular ejection fraction, by estimation, is 35%. The left ventricle has moderately decreased function. Pericardium: Overall oderate to large circumferential pericardial effusion.The effusion is large and most prominent anterior to the RV measuring 3.1 cm. Difficult to assess respiratory variation. Poor telemetry tracing, the inflow waves have irregular intervals, cannot distinguish if the respiratory variation is solely secondary to the irregular rhythm. There is RA diastolic indentation without clear collapse. There is indentation of the RV with some restriction of diastolic filling  without frank collapse. Findings stable compared to 05/20/20 study. Venous: The  inferior vena cava is dilated in size with less than 50% respiratory variability, suggesting right atrial pressure of 15 mmHg. Carlyle Dolly MD Electronically signed by Carlyle Dolly MD Signature Date/Time: 05/24/2020/12:43:31 PM    Final     Cardiac Studies   Preliminary review of repeat echo this AM shows a large circumferential pericardial effusion, a severely plethoric inferior vena cava and intermittent RA collapse, consistent with tamponade physiology. Mitral and tricuspid inflow was not documented.  Patient Profile     69 y.o. 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 Found to have a large pericardial effusion with features of tamponade and a decline in LVEF to 35%. Transferred to [facility] for pericardiocentesis.  Assessment & Plan    1. Pericardial tamponade: concern re: pericardiocentesis with concomitant antiplatelet therapy and oral anticoagulant (last Eliquis dose was yesterday morning). Note that effusion developed when he was not taking anticoagulants (Eliquis started on admission to hospital 09/09). I do not think that the effusion appears loculated. Can delay the pericardiocentesis until PM to allow some more resolution of the anticoagulant effect, but I am concerned that he may decompensate if we delay until tomorrow. 2. CAD: extensive PCI to LCX for restenosis, but no PCI in 3 years. Can safely clopidogrel if surgery is contemplated, but I do not think that we can wait 5-7 days for resolution of antiplatelet effect. 3. Ac on CKD: it appears to me that since his COVID hospitalization a year ago his new baseline creat is approx. 1.6. Slight worsening during this admission likely due to reduced cardiac  output from tamponade. Avoid additional diuresis until effusion is drained. 4. AFib: rhythm is remarkably regular, but may represent atypical A flutter. Any attempts att CV will have to be delayed since we will have to hold the anticoagulation.     For questions or updates, please contact [place] Please consult [email or web address] for contact info under        Signed, Sanda Klein, MD  06/07/2020, 9:43 AM

## 2020-05-25 NOTE — Progress Notes (Signed)
  Echocardiogram 2D Echocardiogram limited during PERICARDIOCENTESIS has been performed.  Robert [name] M 06/03/2020, 12:19 PM

## 2020-05-25 NOTE — Consult Note (Signed)
[address]       [place] [address]             [phone]        Miquan D Daughety Hartwell Medical Record #[id number] Date of Birth: Dec 26, 1950  Referring: No ref. provider found Primary Care: Redmond School, MD Primary Cardiologist:Mihai Croitoru, MD  Chief Complaint:    Chief Complaint  Patient presents with  . Shortness of Breath    History of Present Illness:     69 year old male with multiple medical problems was admitted from [facility] with large pericardial effusion.  He has history of Covid 19 pneumonia in August of last year, history of coronary artery disease status post multiple PCI's and stent placements, and atrial fibrillation.  Over the last several weeks she has developed progressive exertional dyspnea, and lower extremity swelling.  Echocardiogram performed showed a circumferential pericardial effusion, and concern for tamponade.  Additionally has developed acute kidney injury which is likely related to his heart failure.   Past Medical and Surgical History: Previous Chest Surgery: No Previous Chest Radiation: No Diabetes Mellitus: Yes.  HbA1C 7.3 Creatinine: 2.18  Past Medical History:  Diagnosis Date  . Anxiety   . CHF (congestive heart failure) ([place])   . Collagen vascular disease ([place])   . COPD (chronic obstructive pulmonary disease) ([place])   . Coronary artery disease   . COVID-19   . Full dentures   . GERD (gastroesophageal reflux disease)    Rolaids as needed  . High cholesterol   . History of MI (myocardial infarction)   . Hypertension    med. dosage increased 04/2016; has been on BP med. x 10 yrs.  . Incarcerated umbilical hernia 06/9322  . Inguinal hernia 05/2016   bilateral   . Insulin dependent diabetes mellitus   . Ischemic cardiomyopathy    a. 03/2015 EF 40-45% by LV gram.  . Left leg cellulitis 10/08/2016  . Rheumatoid arthritis(714.0)     Past Surgical History:  Procedure Laterality Date  .  CARDIAC CATHETERIZATION N/A 02/01/2015   Procedure: Left Heart Cath and Coronary Angiography;  Surgeon: Belva Crome, MD;  Location: [place] [facility];  Service: Cardiovascular;  Laterality: N/A;  . CARDIAC CATHETERIZATION N/A 03/22/2015   Procedure: Left Heart Cath and Coronary Angiography;  Surgeon: Leonie Man, MD;  Location: [place] [facility];  Service: Cardiovascular;  Laterality: N/A;  . CARDIAC CATHETERIZATION  09/05/2002; 03/09/2003; 04/10/2005;06/02/2005; 05/09/2006; 02/09/2007; 03/01/2007  . COLONOSCOPY  2008   Dr. Gala Romney: diverticulosis, hyperplastic polyp.  . CORONARY ANGIOPLASTY  11/14/2012; 02/01/2015  . CORONARY ANGIOPLASTY WITH STENT PLACEMENT  05/16/2012   "1; makes total ~ 4"  . CORONARY STENT INTERVENTION N/A 06/06/2017   Procedure: CORONARY STENT INTERVENTION;  Surgeon: Belva Crome, MD;  Location: [place] [facility];  Service: Cardiovascular;  Laterality: N/A;  . INSERTION OF MESH Bilateral 05/24/2016   Procedure: INSERTION OF MESH;  Surgeon: Coralie Keens, MD;  Location: [place];  Service: General;  Laterality: Bilateral;  . LAPAROSCOPIC CHOLECYSTECTOMY    . LAPAROSCOPIC INGUINAL HERNIA WITH UMBILICAL HERNIA Bilateral 05/24/2016   Procedure: BILATERAL LAPAROSCOPIC INGUINAL HERNIA REPAIR WITH MESH AND UMBILICAL HERNIA REPAIR WITH MESH;  Surgeon: Coralie Keens, MD;  Location: [place];  Service: General;  Laterality: Bilateral;  . LEFT HEART CATH AND CORONARY ANGIOGRAPHY N/A 06/06/2017   Procedure: LEFT HEART CATH AND CORONARY ANGIOGRAPHY;  Surgeon: Belva Crome, MD;  Location: [place] [facility];  Service: Cardiovascular;  Laterality: N/A;  . LEFT HEART CATHETERIZATION WITH CORONARY ANGIOGRAM N/A 12/22/2011   Procedure: LEFT HEART CATHETERIZATION WITH CORONARY ANGIOGRAM;  Surgeon: Troy Sine, MD;  Location: [facility] CATH LAB;  Service: Cardiovascular;  Laterality: N/A;  . LEFT HEART CATHETERIZATION WITH CORONARY ANGIOGRAM Bilateral 05/16/2012    Procedure: LEFT HEART CATHETERIZATION WITH CORONARY ANGIOGRAM;  Surgeon: Lorretta Harp, MD;  Location: [facility] CATH LAB;  Service: Cardiovascular;  Laterality: Bilateral;  . LEFT HEART CATHETERIZATION WITH CORONARY ANGIOGRAM N/A 11/14/2012   Procedure: LEFT HEART CATHETERIZATION WITH CORONARY ANGIOGRAM;  Surgeon: Troy Sine, MD;  Location: [facility] CATH LAB;  Service: Cardiovascular;  Laterality: N/A;  . LEFT HEART CATHETERIZATION WITH CORONARY ANGIOGRAM N/A 09/01/2013   Procedure: LEFT HEART CATHETERIZATION WITH CORONARY ANGIOGRAM;  Surgeon: Leonie Man, MD;  Location: [facility] CATH LAB;  Service: Cardiovascular;  Laterality: N/A;  . [place]; 1973; 1985   x 3  . NM MYOCAR PERF WALL MOTION  08/30/2009   No significant ischemia  . OPEN REDUCTION INTERNAL FIXATION (ORIF) DISTAL RADIAL FRACTURE Right 12/10/2016   Procedure: OPEN REDUCTION INTERNAL FIXATION (ORIF) DISTAL RADIAL FRACTURE;  Surgeon: Iran Planas, MD;  Location: [place];  Service: Orthopedics;  Laterality: Right;  . PERCUTANEOUS CORONARY INTERVENTION-BALLOON ONLY  12/22/2011   Procedure: PERCUTANEOUS CORONARY INTERVENTION-BALLOON ONLY;  Surgeon: Troy Sine, MD;  Location: [facility] CATH LAB;  Service: Cardiovascular;;  . PERCUTANEOUS CORONARY INTERVENTION-BALLOON ONLY  11/14/2012   Procedure: PERCUTANEOUS CORONARY INTERVENTION-BALLOON ONLY;  Surgeon: Troy Sine, MD;  Location: [facility] CATH LAB;  Service: Cardiovascular;;  . PERCUTANEOUS CORONARY STENT INTERVENTION (PCI-S)  05/16/2012   Procedure: PERCUTANEOUS CORONARY STENT INTERVENTION (PCI-S);  Surgeon: Lorretta Harp, MD;  Location: [facility] CATH LAB;  Service: Cardiovascular;;  . PERCUTANEOUS CORONARY STENT INTERVENTION (PCI-S)  09/01/2013   Procedure: PERCUTANEOUS CORONARY STENT INTERVENTION (PCI-S);  Surgeon: Leonie Man, MD;  Location: [facility] CATH LAB;  Service: Cardiovascular;;      Social History   Tobacco Use  Smoking Status Current Some Day Smoker  . Packs/day: 1.00  . Types:  Cigarettes  . Last attempt to quit: 06/11/2015  . Years since quitting: 4.9  Smokeless Tobacco Never Used  Tobacco Comment   pt states smoking less than half a pack    Social History   Substance and Sexual Activity  Alcohol Use No     Allergies  Allergen Reactions  . Ivp Dye [Iodinated Diagnostic Agents] Itching and Rash    Medications: Asprin: Yes Statin: Yes Beta Blocker: Yes Ace Inhibitor: No Anti-Coagulation: Plavix and Eliquis.  Current Facility-Administered Medications  Medication Dose Route Frequency Provider Last Rate Last Admin  . 0.9 %  sodium chloride infusion  250 mL Intravenous PRN Bonnielee Haff, MD      . 0.9 %  sodium chloride infusion  250 mL Intravenous PRN Dunn, Dayna N, PA-C      . 0.9 %  sodium chloride infusion   Intravenous Continuous Dunn, Dayna N, PA-C 10 mL/hr at 05/13/2020 0432 New Bag at 05/31/2020 0432  . acetaminophen (TYLENOL) tablet 650 mg  650 mg Oral Q4H PRN Bonnielee Haff, MD      . atorvastatin (LIPITOR) tablet 80 mg  80 mg Oral q1800 Bonnielee Haff, MD   80 mg at 05/24/20 1655  . diclofenac Sodium (VOLTAREN) 1 % topical gel 2 g  2 g Topical TID PRN Manuella Ghazi, Pratik D, DO      . diphenhydrAMINE (  BENADRYL) injection 25 mg  25 mg Intravenous Once Belva Crome, MD      . fluticasone [facility]) 50 MCG/ACT nasal spray 1 spray  1 spray Each Nare Daily Bonnielee Haff, MD   1 spray at 06/05/2020 0820  . folic acid (FOLVITE) tablet 1 mg  1 mg Oral Daily Bonnielee Haff, MD   1 mg at 06/03/2020 0814  . furosemide (LASIX) 160 mg in dextrose 5 % 50 mL IVPB  160 mg Intravenous TID Rexene Agent, MD 66 mL/hr at 05/24/20 2125 160 mg at 05/24/20 2125  . gabapentin (NEURONTIN) capsule 300 mg  300 mg Oral TID Bonnielee Haff, MD   300 mg at 06/05/2020 8315  . [START ON 05/26/2020] influenza vaccine adjuvanted (FLUAD) injection 0.5 mL  0.5 mL Intramuscular Tomorrow-1000 Dessa Phi, DO      . insulin aspart (novoLOG) injection 0-20 Units  0-20 Units Subcutaneous Q4H  Shah, Pratik D, DO   4 Units at 06/03/2020 1761  . insulin aspart (novoLOG) injection 6 Units  6 Units Subcutaneous TID WC Shah, Pratik D, DO   6 Units at 05/24/20 1656  . ipratropium-albuterol (DUONEB) 0.5-2.5 (3) MG/3ML nebulizer solution 3 mL  3 mL Nebulization Q4H PRN Bonnielee Haff, MD   3 mL at 05/27/2020 0425  . melatonin tablet 3 mg  3 mg Oral QHS Blount, Xenia T, NP   3 mg at 05/23/20 2259  . methylPREDNISolone sodium succinate (SOLU-MEDROL) 125 mg/2 mL injection 125 mg  125 mg Intravenous Once Belva Crome, MD      . metoprolol tartrate (LOPRESSOR) tablet 25 mg  25 mg Oral BID Manuella Ghazi, Pratik D, DO   25 mg at 05/18/2020 0813  . mometasone-formoterol (DULERA) 200-5 MCG/ACT inhaler 2 puff  2 puff Inhalation BID Bonnielee Haff, MD   2 puff at 05/24/20 2044  . Muscle Rub CREA   Topical PRN Manuella Ghazi, Pratik D, DO      . ondansetron (ZOFRAN) injection 4 mg  4 mg Intravenous Q6H PRN Bonnielee Haff, MD      . primidone (MYSOLINE) tablet 100 mg  100 mg Oral QHS Bonnielee Haff, MD   100 mg at 05/24/20 2106  . sodium chloride flush (NS) 0.9 % injection 3 mL  3 mL Intravenous Q12H Bonnielee Haff, MD   3 mL at 05/24/20 2117  . sodium chloride flush (NS) 0.9 % injection 3 mL  3 mL Intravenous PRN Bonnielee Haff, MD      . sodium chloride flush (NS) 0.9 % injection 3 mL  3 mL Intravenous Q12H Dunn, Dayna N, PA-C   3 mL at 05/24/20 2124  . sodium chloride flush (NS) 0.9 % injection 3 mL  3 mL Intravenous PRN Dunn, Dayna N, PA-C        Medications Prior to Admission  Medication Sig Dispense Refill Last Dose  . acarbose (PRECOSE) 100 MG tablet Take 1 tablet (100 mg total) by mouth 3 (three) times daily with meals. 90 tablet 0 05/18/2020 at Unknown time  . acetaminophen (TYLENOL) 650 MG CR tablet Take 650 mg by mouth every 8 (eight) hours as needed for pain.     [name] aspirin EC 81 MG tablet Take 1 tablet (81 mg total) by mouth daily. 90 tablet 3 05/18/2020 at 0900  . atorvastatin (LIPITOR) 80 MG tablet Take 1 tablet  (80 mg total) by mouth daily at 6 PM. 90 tablet 1 05/18/2020 at Unknown time  . clopidogrel (PLAVIX) 75 MG tablet Take 1 tablet (  75 mg total) by mouth daily. 90 tablet 3 05/18/2020 at 2300  . fish oil-omega-3 fatty acids 1000 MG capsule Take 1 g by mouth daily.    05/18/2020 at Unknown time  . fluticasone (FLONASE) 50 MCG/ACT nasal spray      . folic acid (FOLVITE) 1 MG tablet Take 1 tablet (1 mg total) by mouth daily. 30 tablet 0 05/18/2020 at Unknown time  . furosemide (LASIX) 40 MG tablet Take 2 tablets (80 mg total) by mouth daily. 90 tablet 1 05/18/2020 at Unknown time  . gabapentin (NEURONTIN) 300 MG capsule Take 300 mg by mouth 3 (three) times daily.   06/08/2020 at Unknown time  . glimepiride (AMARYL) 2 MG tablet Take 1 tablet (2 mg total) by mouth daily with breakfast. 30 tablet 0 05/31/2020 at Unknown time  . Insulin Glargine (TOUJEO SOLOSTAR [place]) Inject 25 Units into the skin at bedtime. 25 units    05/18/2020 at Unknown time  . isosorbide mononitrate (IMDUR) 60 MG 24 hr tablet Take 1 tablet (60 mg total) by mouth daily. 90 tablet 3 05/12/2020 at Unknown time  . losartan (COZAAR) 25 MG tablet Take 0.5 tablets (12.5 mg total) by mouth daily. 90 tablet 3 05/18/2020 at Unknown time  . metoprolol tartrate (LOPRESSOR) 25 MG tablet Take 1 tablet (25 mg total) by mouth 2 (two) times daily. 180 tablet 3 05/16/2020 at 0900  . mometasone-formoterol (DULERA) 200-5 MCG/ACT AERO Inhale 2 puffs into the lungs 2 (two) times daily. 13 g 0 05/18/2020 at Unknown time  . nitroGLYCERIN (NITROSTAT) 0.4 MG SL tablet Place 1 tablet (0.4 mg total) under the tongue every 5 (five) minutes x 3 doses as needed for chest pain. 25 tablet 2   . potassium chloride SA (KLOR-CON) 20 MEQ tablet Take 1 tablet (20 mEq total) by mouth 2 (two) times daily. 90 tablet 1 05/18/2020 at Unknown time  . primidone (MYSOLINE) 50 MG tablet Take 100 mg by mouth at bedtime.   05/18/2020 at Unknown time  . ALPRAZolam (XANAX) 0.5 MG tablet Take 1 tablet (0.5 mg total)  by mouth 3 (three) times daily as needed for anxiety or sleep. (Patient not taking: Reported on 06/01/2020) 45 tablet 0 Not Taking at Unknown time  . collagenase (SANTYL) ointment Apply topically daily. And cover with damp to dry dressing daily. Change more frequently if soiled 30 g 1   . terbinafine (LAMISIL) 250 MG tablet Take 250 mg by mouth daily. (Patient not taking: Reported on 05/24/2020)   Not Taking at Unknown time  . vitamin C (VITAMIN C) 250 MG tablet Take 1 tablet (250 mg total) by mouth 2 (two) times daily. (Patient not taking: Reported on 05/24/2020) 60 tablet 0 Not Taking at Unknown time    Family History  Problem Relation Age of Onset  . Heart disease Father   . Colon cancer Brother        early 25s  . Hypertension Mother   . Heart disease Mother   . Colon cancer Mother        died age 60. late 38s early 88s at diagnosis.   . Lung cancer Mother      Review of Systems:   Review of Systems  Constitutional: Positive for malaise/fatigue.  Respiratory: Positive for cough and shortness of breath.   Cardiovascular: Positive for palpitations, orthopnea and leg swelling. Negative for chest pain.      Physical Exam: BP 104/72 (BP Location: Left Arm)   Pulse 95   Temp  98.1 F (36.7 C) (Oral)   Resp 20   Ht 6' (1.829 m)   Wt 117.5 kg   SpO2 97%   BMI 35.13 kg/m  Physical Exam Constitutional:      General: He is not in acute distress.    Appearance: He is ill-appearing. He is not toxic-appearing.  HENT:     Head: Normocephalic and atraumatic.  Cardiovascular:     Rate and Rhythm: Tachycardia present. Rhythm irregular.  Pulmonary:     Effort: Tachypnea present. No respiratory distress.  Abdominal:     Palpations: Abdomen is soft.  Musculoskeletal:     Right lower leg: Edema present.     Left lower leg: Edema present.  Neurological:     Mental Status: He is alert and oriented to person, place, and time.         I have independently reviewed the above radiologic  studies and discussed with the patient   Recent Lab Findings: Lab Results  Component Value Date   WBC 11.0 (H) 05/13/2020   HGB 14.3 05/15/2020   HCT 44.0 05/15/2020   PLT 268 05/24/2020   GLUCOSE 154 (H) 05/16/2020   CHOL 83 05/18/2019   TRIG 57 05/18/2019   HDL 28 (L) 05/18/2019   LDLCALC 44 05/18/2019   ALT 227 (H) 05/24/2020   AST 125 (H) 05/24/2020   NA 130 (L) 06/06/2020   K 4.8 06/08/2020   CL 95 (L) 06/02/2020   CREATININE 2.18 (H) 06/09/2020   BUN 118 (H) 05/13/2020   CO2 25 05/19/2020   TSH 5.345 (H) 05/21/2020   INR 1.2 05/19/2019   HGBA1C 7.3 (H) 05/20/2020      Assessment / Plan:   69 year old male with heart failure, and a circumferential pericardial effusion and signs of tamponade.  He is scheduled to undergo a pericardiocentesis later today.  If this is unsuccessful then we will take him emergently for pericardial window.  Ideally I would like some time for reversal of his antiplatelet therapy but given his current clinical status and will not be able to do this.  Pericardial decompression of the pericardiocentesis will definitely be beneficial given his tamponade physiology on echo.  If this is successful then we will plan to do his pericardial window on Thursday.     I  spent 30 minutes counseling the patient face to face.   Lajuana Matte 05/21/2020 10:38 AM

## 2020-05-25 NOTE — Progress Notes (Addendum)
INTERVENTIONAL CARDIOLOGY CONSULT NOTE   Spoke to Robert [name] this morning.  He has orthopnea when lying flat.  He had COVID-19 within the past 12 months and was in [place] 4 weeks.  He was admitted to [facility] for acute heart failure.  Found to have reduced EF and moderate to large pericardial effusion.  Received Eliquis yesterday a.m. but none since.  He is also on Plavix with most recent dose yesterday.  We will hold Plavix today.  Echocardiogram reviewed demonstrating circumferential effusion primarily inferior and anterior with mixed echogenicity raising question of possible thrombus.  On exam with the patient lying at 40 degrees his neck veins are flat.  His abdomen is distended suggesting ascites.  Laboratory data reveals mild elevation in liver enzymes, severe azotemia with creatinine greater than 100, and creatinine greater than 2.  OVERALL IMPRESSION:  The patient needs pericardial drainage.  The question is whether to proceed with percutaneous versus subxiphoid window.  Would get surgical consultation to evaluate for possible subxiphoid window.  Given that the patient is clinically stable and out of heart failure, recommend continued hold of Eliquis and Plavix and perhaps consider percutaneous pericardiocentesis tomorrow if he is turned down as a candidate for subxiphoid window.  Limited echo to reassess pericardial effusion and early a.m. tomorrow.

## 2020-05-25 NOTE — H&P (View-Only) (Signed)
Progress Note  Patient Name: Robert [name] Date of Encounter: 05/16/2020  [facility] Cardiologist: Sanda Klein, MD   Subjective   Severe orthopnea. No chest pain. Worsened pedal edema. Borderline tachycardic (90s) and hypotensive (90-100/70s).  Inpatient Medications    Scheduled Meds: . atorvastatin  80 mg Oral q1800  . diphenhydrAMINE  25 mg Intravenous Once  . fluticasone  1 spray Each Nare Daily  . folic acid  1 mg Oral Daily  . gabapentin  300 mg Oral TID  . insulin aspart  0-20 Units Subcutaneous Q4H  . insulin aspart  6 Units Subcutaneous TID WC  . melatonin  3 mg Oral QHS  . methylPREDNISolone sodium succinate  125 mg Intravenous Once  . metoprolol tartrate  25 mg Oral BID  . mometasone-formoterol  2 puff Inhalation BID  . primidone  100 mg Oral QHS  . sodium chloride flush  3 mL Intravenous Q12H  . sodium chloride flush  3 mL Intravenous Q12H   Continuous Infusions: . sodium chloride    . sodium chloride    . sodium chloride 10 mL/hr at 05/30/2020 0432  . furosemide 160 mg (05/24/20 2125)   PRN Meds: sodium chloride, sodium chloride, acetaminophen, diclofenac Sodium, ipratropium-albuterol, Muscle Rub, ondansetron (ZOFRAN) IV, sodium chloride flush, sodium chloride flush   Vital Signs    Vitals:   06/07/2020 0216 06/03/2020 0358 05/29/2020 0424 05/18/2020 0813  BP: 94/68 105/72  104/72  Pulse: 94 (!) 114  95  Resp: (!) 24 (!) 22  20  Temp: 98.4 F (36.9 C) 98.4 F (36.9 C)  98.1 F (36.7 C)  TempSrc: Oral Oral  Oral  SpO2: 93% 90% 93% 97%  Weight:  117.5 kg    Height:        Intake/Output Summary (Last 24 hours) at 05/23/2020 0943 Last data filed at 06/05/2020 0417 Gross per 24 hour  Intake 829.79 ml  Output 400 ml  Net 429.79 ml   Last 3 Weights 05/12/2020 05/24/2020 05/24/2020  Weight (lbs) 259 lb 0.7 oz 258 lb 255 lb 4.7 oz  Weight (kg) 117.5 kg 117.028 kg 115.8 kg      Telemetry    Regular rhythm, probably atypical AFlutter with 3:1 AV  block, but could be sinus, very hard to see P waves - Personally Reviewed  ECG    Probably atypical atrial flutter low voltage, TWI I, aVL, V1-V4, poor R wave progression - Personally Reviewed  Physical Exam  Obese GEN: No acute distress.   Neck: JVD to angle of jaw Cardiac: very distant heart sounds, RRR, no murmurs, rubs, or gallops.  Respiratory: A few rales in left base, otherwise clear to auscultation bilaterally. GI: Soft, nontender, distended (chronically) MS: 3+ symmetrical dependent edema; No deformity. Neuro:  Nonfocal  Psych: Normal affect   Labs    High Sensitivity Troponin:   Recent Labs  Lab 05/16/20 1228 05/16/20 1436 05/16/2020 1310 05/12/2020 1653  TROPONINIHS 10 10 9 10       Chemistry Recent Labs  Lab 05/22/20 0840 05/23/20 0908 05/24/20 1005  NA 134* 128* 130*  K 4.4 4.5 4.7  CL 97* 94* 94*  CO2 28 25 25   GLUCOSE 646* 273* 247*  BUN 82* 90* 105*  CREATININE 1.69* 1.99* 2.09*  CALCIUM 8.5* 8.4* 8.8*  PROT  --   --  6.3*  ALBUMIN  --   --  3.1*  AST  --   --  125*  ALT  --   --  227*  ALKPHOS  --   --  134*  BILITOT  --   --  0.5  GFRNONAA 41* 34* 32*  GFRAA 48* 39* 37*  ANIONGAP 9 9 11      Hematology Recent Labs  Lab 05/20/20 0629 05/21/20 0445 05/23/20 0908  WBC 10.7* 8.8 9.8  RBC 4.49 4.57 4.77  HGB 13.9 14.0 14.8  HCT 43.3 44.3 46.1  MCV 96.4 96.9 96.6  MCH 31.0 30.6 31.0  MCHC 32.1 31.6 32.1  RDW 14.5 14.6 14.6  PLT 188 211 232    BNP Recent Labs  Lab 06/03/2020 1310  BNP 157.0*     DDimer No results for input(s): DDIMER in the last 168 hours.   Radiology    ECHOCARDIOGRAM LIMITED  Result Date: 05/24/2020    ECHOCARDIOGRAM LIMITED REPORT   Patient Name:   REFUJIO HAYMER Date of Exam: 05/24/2020 Medical Rec #:  [id number]      Height:       72.0 in Accession #:    [id number]     Weight:       255.3 lb Date of Birth:  Mar 08, 1951      BSA:          2.363 m Patient Age:    34 years       BP:           118/99 mmHg Patient  Gender: M              HR:           106 bpm. Exam Location:  [facility] Procedure: Limited Echo and Cardiac Doppler Indications:    Pericardial effusion 423.9 / I31.3  History:        Patient has prior history of Echocardiogram examinations, most                 recent 05/20/2020. Previous Myocardial Infarction, COPD; Risk                 Factors:Hypertension, Diabetes, Dyslipidemia and Current Smoker.                 Acute Respiratory Failure.  Sonographer:    Alvino Chapel RCS Referring Phys: [place]  1. Left ventricular ejection fraction, by estimation, is 35%. The left ventricle has moderately decreased function.  2. Overall oderate to large circumferential pericardial effusion.The effusion is large and most prominent anterior to the RV measuring 3.1 cm. Difficult to assess respiratory variation. Poor telemetry tracing, the inflow waves have irregular intervals, cannot distinguish if the respiratory variation is solely secondary to the irregular rhythm. There is RA diastolic indentation without clear collapse. There is indentation of the RV with some restriction of diastolic filling without frank collapse. Findings stable compared to 05/20/20 study.  3. The inferior vena cava is dilated in size with <50% respiratory variability, suggesting right atrial pressure of 15 mmHg. FINDINGS  Left Ventricle: Left ventricular ejection fraction, by estimation, is 35%. The left ventricle has moderately decreased function. Pericardium: Overall oderate to large circumferential pericardial effusion.The effusion is large and most prominent anterior to the RV measuring 3.1 cm. Difficult to assess respiratory variation. Poor telemetry tracing, the inflow waves have irregular intervals, cannot distinguish if the respiratory variation is solely secondary to the irregular rhythm. There is RA diastolic indentation without clear collapse. There is indentation of the RV with some restriction of diastolic filling  without frank collapse. Findings stable compared to 05/20/20 study. Venous: The  inferior vena cava is dilated in size with less than 50% respiratory variability, suggesting right atrial pressure of 15 mmHg. Carlyle Dolly MD Electronically signed by Carlyle Dolly MD Signature Date/Time: 05/24/2020/12:43:31 PM    Final     Cardiac Studies   Preliminary review of repeat echo this AM shows a large circumferential pericardial effusion, a severely plethoric inferior vena cava and intermittent RA collapse, consistent with tamponade physiology. Mitral and tricuspid inflow was not documented.  Patient Profile     69 y.o. 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 Found to have a large pericardial effusion with features of tamponade and a decline in LVEF to 35%. Transferred to [facility] for pericardiocentesis.  Assessment & Plan    1. Pericardial tamponade: concern re: pericardiocentesis with concomitant antiplatelet therapy and oral anticoagulant (last Eliquis dose was yesterday morning). Note that effusion developed when he was not taking anticoagulants (Eliquis started on admission to hospital 09/09). I do not think that the effusion appears loculated. Can delay the pericardiocentesis until PM to allow some more resolution of the anticoagulant effect, but I am concerned that he may decompensate if we delay until tomorrow. 2. CAD: extensive PCI to LCX for restenosis, but no PCI in 3 years. Can safely clopidogrel if surgery is contemplated, but I do not think that we can wait 5-7 days for resolution of antiplatelet effect. 3. Ac on CKD: it appears to me that since his COVID hospitalization a year ago his new baseline creat is approx. 1.6. Slight worsening during this admission likely due to reduced cardiac  output from tamponade. Avoid additional diuresis until effusion is drained. 4. AFib: rhythm is remarkably regular, but may represent atypical A flutter. Any attempts att CV will have to be delayed since we will have to hold the anticoagulation.     For questions or updates, please contact [place] Please consult [email or web address] for contact info under        Signed, Sanda Klein, MD  05/13/2020, 9:43 AM

## 2020-05-25 NOTE — Care Management Important Message (Signed)
Important Message  Patient Details  Name: Robert [name] MRN: [id number] Date of Birth: Nov 22, 1950   Medicare Important Message Given:  Yes     Shelda Altes 06/01/2020, 10:50 AM

## 2020-05-25 NOTE — Progress Notes (Signed)
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 He usually takes about 80 mg of Lasix once a day. He has been compliant with his medication. He presented to the emergency department on September 5. He was given 2 doses of IV Lasix 80 mg each. He felt better. He was discharged home. However he has gained a total of about 8 pounds in the last week or so. He started developing shortness of breath in the last 24 hours and so he decided to come back to the hospital.   In the emergency department chest x-ray shows pulmonary edema. He was noted to be hypoxic saturating in the late 80s. He has oxygen at home but does not use it on a regular basis. Patient was admitted for acute heart failure. He was diuresed with IV Lasix. Echocardiogram revealed pericardial effusion. He continued to have volume overload, worsening kidney function. He was transferred to [facility] for further evaluation.  New events last 24 hours / Subjective: Denies any chest pain but admits to peripheral edema as well as shortness of breath.  Assessment & Plan:   Principal Problem:   Acute CHF (congestive heart failure) (HCC) Active Problems:   COPD (chronic obstructive pulmonary disease) (HCC)   Controlled type 2 diabetes mellitus with diabetic peripheral angiopathy without gangrene, with long-term current use of insulin (HCC)   Acute respiratory failure with hypoxemia (HCC)   CKD (chronic kidney disease), stage III   Pericardial effusion   Acute on chronic hypoxemic  respiratory failure secondary to acute diastolic CHF exacerbation -Remains on 4 L nasal cannula O2 this morning -EF 60 to 65% -Cardiology following -IV Lasix, Lopressor -Strict I's and O's and daily weight  Pericardial tamponade  -Cardiology planning for pericardiocentesis today. CTS following for possible pericardial window   AKI onCKD stage IIIb -New baseline creatinine around 1.6 -Nephrology following  Hyponatremia likely in setting of volume overload -Sodium levels remain stable  Paroxysmal atrial fibrillation -CHA2DS2-VASc score 5 -Eliquis on hold  History of CAD -Aspirin and Plavix on hold -Hold Lipitor in setting of elevated LFT  Elevated liver enzymes -Suspect secondary to congestive hepatopathy -Hold Lipitor -Monitor LFT  Insulin-dependent diabetes with peripheral neuropathy -Hemoglobin A1c7.3% -Continue sliding scale insulin -Continue Neurontin  History of COPD -DuoNebs as needed     DVT prophylaxis: IV heparin. Eliquis on hold   Code Status: Full Family Communication: None at bedside Disposition Plan:  Status is: Inpatient  Remains inpatient appropriate because:Ongoing diagnostic testing needed not appropriate for outpatient work up, IV treatments appropriate due to intensity of illness or inability to take PO and Inpatient level of care appropriate due to severity of illness   Dispo: The patient is from: Home              Anticipated d/c is to: Home              Anticipated d/c date is: > 3 days              Patient currently is not medically stable to d/c. Heart cath/pericardiocentesis planned  today. Remains critically ill with tamponade and AKI as well as respiratory failure.   Consultants:   Cardiology  Nephrology  Antimicrobials:  Anti-infectives (From admission, onward)   None        Objective: Vitals:   06/09/2020 0216 05/13/2020 0358 05/26/2020 0424 05/24/2020 0813  BP: 94/68 105/72  104/72  Pulse: 94 (!) 114  95  Resp:  (!) 24 (!) 22  20  Temp: 98.4 F (36.9 C) 98.4 F (36.9 C)  98.1 F (36.7 C)  TempSrc: Oral Oral  Oral  SpO2: 93% 90% 93% 97%  Weight:  117.5 kg    Height:        Intake/Output Summary (Last 24 hours) at 05/26/2020 1036 Last data filed at 05/12/2020 0417 Gross per 24 hour  Intake 829.79 ml  Output 400 ml  Net 429.79 ml   Filed Weights   05/24/20 0512 05/24/20 1815 05/12/2020 0358  Weight: 115.8 kg 117 kg 117.5 kg    Examination:  General exam: Appears calm   Respiratory system: +conversational dyspnea. On 4L [place] O2  Cardiovascular system: S1 & S2 heard, RRR. No murmurs. +pedal edema. Gastrointestinal system: Abdomen is nondistended, soft and nontender Central nervous system: Alert and oriented. No focal neurological deficits. Speech clear.  Extremities: Symmetric in appearance  Skin: No rashes, lesions or ulcers on exposed skin  Psychiatry: Judgement and insight appear normal. Mood & affect appropriate.   Data Reviewed: I have personally reviewed following labs and imaging studies  CBC: Recent Labs  Lab 05/21/2020 1310 05/20/20 0629 05/21/20 0445 05/23/20 0908 05/15/2020 0910  WBC 9.3 10.7* 8.8 9.8 11.0*  NEUTROABS 7.9* 9.0*  --   --   --   HGB 14.5 13.9 14.0 14.8 14.3  HCT 44.7 43.3 44.3 46.1 44.0  MCV 97.6 96.4 96.9 96.6 94.8  PLT 182 188 211 232 867   Basic Metabolic Panel: Recent Labs  Lab 05/20/20 0629 05/21/20 0445 05/22/20 0840 05/23/20 0908 05/24/20 1005  NA 135 133* 134* 128* 130*  K 4.3 4.2 4.4 4.5 4.7  CL 101 99 97* 94* 94*  CO2 24 26 28 25 25   GLUCOSE 175* 171* 254* 273* 247*  BUN 73* 73* 82* 90* 105*  CREATININE 1.71* 1.58* 1.69* 1.99* 2.09*  CALCIUM 8.8* 8.3* 8.5* 8.4* 8.8*  MG  --  2.6* 2.5* 2.5* 2.6*   GFR: Estimated Creatinine Clearance: 45.4 mL/min (A) (by C-G formula based on SCr of 2.09 mg/dL (H)). Liver Function Tests: Recent Labs  Lab 05/24/20 1005  AST 125*  ALT 227*  ALKPHOS 134*  BILITOT 0.5  PROT 6.3*  ALBUMIN 3.1*   No  results for input(s): LIPASE, AMYLASE in the last 168 hours. No results for input(s): AMMONIA in the last 168 hours. Coagulation Profile: No results for input(s): INR, PROTIME in the last 168 hours. Cardiac Enzymes: No results for input(s): CKTOTAL, CKMB, CKMBINDEX, TROPONINI in the last 168 hours. BNP (last 3 results) No results for input(s): PROBNP in the last 8760 hours. HbA1C: No results for input(s): HGBA1C in the last 72 hours. CBG: Recent Labs  Lab 05/24/20 1653 05/24/20 1959 05/14/2020 0052 06/10/2020 0403 05/21/2020 0822  GLUCAP 340* 263* 220* 178* 180*   Lipid Profile: No results for input(s): CHOL, HDL, LDLCALC, TRIG, CHOLHDL, LDLDIRECT in the last 72 hours. Thyroid Function Tests: No results for input(s): TSH, T4TOTAL, FREET4, T3FREE, THYROIDAB in the last 72 hours. Anemia Panel: No results for input(s): VITAMINB12, FOLATE, FERRITIN, TIBC, IRON, RETICCTPCT in the last 72  hours. Sepsis Labs: Recent Labs  Lab 05/16/2020 1342  LATICACIDVEN 0.8    Recent Results (from the past 240 hour(s))  SARS Coronavirus 2 by RT PCR (hospital order, performed in [facility] hospital lab) Nasopharyngeal Nasopharyngeal Swab     Status: None   Collection Time: 05/16/20  3:23 PM   Specimen: Nasopharyngeal Swab  Result Value Ref Range Status   SARS Coronavirus 2 NEGATIVE NEGATIVE Final    Comment: (NOTE) SARS-CoV-2 target nucleic acids are NOT DETECTED.  The SARS-CoV-2 RNA is generally detectable in upper and lower respiratory specimens during the acute phase of infection. The lowest concentration of SARS-CoV-2 viral copies this assay can detect is 250 copies / mL. A negative result does not preclude SARS-CoV-2 infection and should not be used as the sole basis for treatment or other patient management decisions.  A negative result may occur with improper specimen collection / handling, submission of specimen other than nasopharyngeal swab, presence of viral mutation(s) within  the areas targeted by this assay, and inadequate number of viral copies (<250 copies / mL). A negative result must be combined with clinical observations, patient history, and epidemiological information.  Fact Sheet for Patients:   StrictlyIdeas.no  Fact Sheet for Healthcare Providers: BankingDealers.co.za  This test is not yet approved or  cleared by the [place] FDA and has been authorized for detection and/or diagnosis of SARS-CoV-2 by FDA under an Emergency Use Authorization (EUA).  This EUA will remain in effect (meaning this test can be used) for the duration of the COVID-19 declaration under Section 564(b)(1) of the Act, 21 U.S.C. section 360bbb-3(b)(1), unless the authorization is terminated or revoked sooner.  Performed at [facility], [address], [place], [place] [address]   SARS Coronavirus 2 by RT PCR (hospital order, performed in [facility] hospital lab) Nasopharyngeal Nasopharyngeal Swab     Status: None   Collection Time: 05/17/2020  1:20 PM   Specimen: Nasopharyngeal Swab  Result Value Ref Range Status   SARS Coronavirus 2 NEGATIVE NEGATIVE Final    Comment: (NOTE) SARS-CoV-2 target nucleic acids are NOT DETECTED.  The SARS-CoV-2 RNA is generally detectable in upper and lower respiratory specimens during the acute phase of infection. The lowest concentration of SARS-CoV-2 viral copies this assay can detect is 250 copies / mL. A negative result does not preclude SARS-CoV-2 infection and should not be used as the sole basis for treatment or other patient management decisions.  A negative result may occur with improper specimen collection / handling, submission of specimen other than nasopharyngeal swab, presence of viral mutation(s) within the areas targeted by this assay, and inadequate number of viral copies (<250 copies / mL). A negative result must be combined with clinical observations, patient history,  and epidemiological information.  Fact Sheet for Patients:   StrictlyIdeas.no  Fact Sheet for Healthcare Providers: BankingDealers.co.za  This test is not yet approved or  cleared by the [place] FDA and has been authorized for detection and/or diagnosis of SARS-CoV-2 by FDA under an Emergency Use Authorization (EUA).  This EUA will remain in effect (meaning this test can be used) for the duration of the COVID-19 declaration under Section 564(b)(1) of the Act, 21 U.S.C. section 360bbb-3(b)(1), unless the authorization is terminated or revoked sooner.  Performed at [facility], [address] [place], [place] [address]   Blood culture (routine x 2)     Status: None   Collection Time: 05/30/2020  1:42 PM   Specimen: BLOOD LEFT HAND  Result Value Ref Range Status   Specimen Description   Final    BLOOD LEFT HAND BOTTLES DRAWN AEROBIC AND ANAEROBIC   Special Requests Blood Culture adequate volume  Final   Culture   Final    NO GROWTH 5 DAYS Performed at [facility], [address] [place], [place] [address]    Report Status 05/24/2020 FINAL  Final  Blood culture (routine x 2)     Status: None   Collection Time: 05/13/2020  1:43 PM   Specimen: Left Antecubital; Blood  Result Value Ref Range Status   Specimen Description   Final    LEFT ANTECUBITAL BOTTLES DRAWN AEROBIC AND ANAEROBIC   Special Requests Blood Culture adequate volume  Final   Culture   Final    NO GROWTH 5 DAYS Performed at [facility], [address], [place], [place] [address]    Report Status 05/24/2020 FINAL  Final  MRSA PCR Screening     Status: None   Collection Time: 05/20/20  5:36 PM   Specimen: Nasopharyngeal  Result Value Ref Range Status   MRSA by PCR NEGATIVE NEGATIVE Final    Comment:        The GeneXpert MRSA Assay (FDA approved for NASAL specimens only), is one component of a comprehensive MRSA colonization surveillance program. It is  not intended to diagnose MRSA infection nor to guide or monitor treatment for MRSA infections. Performed at [facility], [place], [address], [place], [place] [address]       Radiology Studies: ECHOCARDIOGRAM LIMITED  Result Date: 06/07/2020    ECHOCARDIOGRAM LIMITED REPORT   Patient Name:   Robert [name] Date of Exam: 06/05/2020 Medical Rec #:  [id number]      Height:       72.0 in Accession #:    [id number]     Weight:       259.0 lb Date of Birth:  [birth year]      BSA:          2.378 m Patient Age:    27 years       BP:           104/72 mmHg Patient Gender: M              HR:           92 bpm. Exam Location:  Inpatient Procedure: Limited Echo and Limited Color Doppler Indications:    I31.3 Pericardial effusion  History:        Patient has prior history of Echocardiogram examinations, most                 recent 05/24/2020. Cardiomyopathy and CHF, Previous Myocardial                 Infarction and CAD, COPD; Risk Factors:Hypertension, Diabetes                 and Dyslipidemia. COVID-19. GERD.  Sonographer:    Jonelle Sidle Dance Referring Phys: [place]  1. Left ventricular ejection fraction, by estimation, is 60 to 65%. The left ventricle has normal function. The left ventricle has no regional wall motion abnormalities.  2. Right ventricular systolic function is normal. The right ventricular size is normal. Tricuspid regurgitation signal is inadequate for assessing PA pressure.  3. Moderate to large pericardial effusion is present measuring 2.29cm at greatest diameter anterior to the RV free wall. The pericardial effusion is circumferential. There is minimal RA inversion but no RA  collapse. The RV cavity is small but no obvious  RV diastolic collapse. No MV inflow velocities were performed.  4. The mitral valve is normal in structure. Mild mitral valve regurgitation. No evidence of mitral stenosis.  5. The aortic valve is tricuspid. Aortic valve regurgitation is not visualized. Mild aortic  valve sclerosis is present, with no evidence of aortic valve stenosis.  6. The inferior vena cava is dilated in size with <50% respiratory variability, suggesting right atrial pressure of 15 mmHg.  7. No significant change in pericardial effusion from 05/24/2020 FINDINGS  Left Ventricle: Left ventricular ejection fraction, by estimation, is 60 to 65%. The left ventricle has normal function. The left ventricle has no regional wall motion abnormalities. The left ventricular internal cavity size was normal in size. There is  no left ventricular hypertrophy. Right Ventricle: The right ventricular size is normal. No increase in right ventricular wall thickness. Right ventricular systolic function is normal. Tricuspid regurgitation signal is inadequate for assessing PA pressure. Left Atrium: Left atrial size was normal in size. Right Atrium: Right atrial size was normal in size. Pericardium: A moderately sized pericardial effusion is present. The pericardial effusion is circumferential. Mitral Valve: The mitral valve is normal in structure. Mild mitral valve regurgitation. No evidence of mitral valve stenosis. Tricuspid Valve: The tricuspid valve is normal in structure. Tricuspid valve regurgitation is not demonstrated. No evidence of tricuspid stenosis. Aortic Valve: The aortic valve is tricuspid. Aortic valve regurgitation is not visualized. Mild aortic valve sclerosis is present, with no evidence of aortic valve stenosis. Pulmonic Valve: The pulmonic valve was normal in structure. Pulmonic valve regurgitation is not visualized. No evidence of pulmonic stenosis. Aorta: The aortic root is normal in size and structure. Venous: The inferior vena cava is dilated in size with less than 50% respiratory variability, suggesting right atrial pressure of 15 mmHg. IAS/Shunts: No atrial level shunt detected by color flow Doppler. IVC IVC diam: 3.50 cm Fransico Him MD Electronically signed by Fransico Him MD Signature Date/Time:  05/21/2020/9:58:41 AM    Final    ECHOCARDIOGRAM LIMITED  Result Date: 05/24/2020    ECHOCARDIOGRAM LIMITED REPORT   Patient Name:   Robert [name] Date of Exam: 05/24/2020 Medical Rec #:  [id number]      Height:       72.0 in Accession #:    [id number]     Weight:       255.3 lb Date of Birth:  1950/09/20      BSA:          2.363 m Patient Age:    54 years       BP:           118/99 mmHg Patient Gender: M              HR:           106 bpm. Exam Location:  [facility] Procedure: Limited Echo and Cardiac Doppler Indications:    Pericardial effusion 423.9 / I31.3  History:        Patient has prior history of Echocardiogram examinations, most                 recent 05/20/2020. Previous Myocardial Infarction, COPD; Risk                 Factors:Hypertension, Diabetes, Dyslipidemia and Current Smoker.                 Acute Respiratory Failure.  Sonographer:  Alvino Chapel RCS Referring Phys: [place]  1. Left ventricular ejection fraction, by estimation, is 35%. The left ventricle has moderately decreased function.  2. Overall oderate to large circumferential pericardial effusion.The effusion is large and most prominent anterior to the RV measuring 3.1 cm. Difficult to assess respiratory variation. Poor telemetry tracing, the inflow waves have irregular intervals, cannot distinguish if the respiratory variation is solely secondary to the irregular rhythm. There is RA diastolic indentation without clear collapse. There is indentation of the RV with some restriction of diastolic filling without frank collapse. Findings stable compared to 05/20/20 study.  3. The inferior vena cava is dilated in size with <50% respiratory variability, suggesting right atrial pressure of 15 mmHg. FINDINGS  Left Ventricle: Left ventricular ejection fraction, by estimation, is 35%. The left ventricle has moderately decreased function. Pericardium: Overall oderate to large circumferential pericardial effusion.The effusion  is large and most prominent anterior to the RV measuring 3.1 cm. Difficult to assess respiratory variation. Poor telemetry tracing, the inflow waves have irregular intervals, cannot distinguish if the respiratory variation is solely secondary to the irregular rhythm. There is RA diastolic indentation without clear collapse. There is indentation of the RV with some restriction of diastolic filling without frank collapse. Findings stable compared to 05/20/20 study. Venous: The inferior vena cava is dilated in size with less than 50% respiratory variability, suggesting right atrial pressure of 15 mmHg. Carlyle Dolly MD Electronically signed by Carlyle Dolly MD Signature Date/Time: 05/24/2020/12:43:31 PM    Final       Scheduled Meds: . atorvastatin  80 mg Oral q1800  . diphenhydrAMINE  25 mg Intravenous Once  . fluticasone  1 spray Each Nare Daily  . folic acid  1 mg Oral Daily  . gabapentin  300 mg Oral TID  . [START ON 05/26/2020] influenza vaccine adjuvanted  0.5 mL Intramuscular Tomorrow-1000  . insulin aspart  0-20 Units Subcutaneous Q4H  . insulin aspart  6 Units Subcutaneous TID WC  . melatonin  3 mg Oral QHS  . methylPREDNISolone sodium succinate  125 mg Intravenous Once  . metoprolol tartrate  25 mg Oral BID  . mometasone-formoterol  2 puff Inhalation BID  . primidone  100 mg Oral QHS  . sodium chloride flush  3 mL Intravenous Q12H  . sodium chloride flush  3 mL Intravenous Q12H   Continuous Infusions: . sodium chloride    . sodium chloride    . sodium chloride 10 mL/hr at 05/29/2020 0432  . furosemide 160 mg (05/24/20 2125)     LOS: 6 days      Time spent: 40 minutes   Dessa Phi, DO [facility] 05/19/2020, 10:36 AM   Available via Epic secure chat 7am-7pm After these hours, please refer to coverage provider listed on [email or web address]

## 2020-05-25 NOTE — Interval H&P Note (Signed)
Cath Lab Visit (complete for each Cath Lab visit)  Clinical Evaluation Leading to the Procedure:   ACS: No.  Non-ACS:    Anginal Classification: No Symptoms  Anti-ischemic medical therapy: No Therapy  Non-Invasive Test Results: No non-invasive testing performed  Prior CABG: No previous CABG      History and Physical Interval Note:  05/19/2020 11:25 AM  Robert [name]  has presented today for surgery, with the diagnosis of Pericardial effusion.  The various methods of treatment have been discussed with the patient and family. After consideration of risks, benefits and other options for treatment, the patient has consented to  Procedure(s): RIGHT HEART CATH (N/A) PERICARDIOCENTESIS (N/A) as a surgical intervention.  The patient's history has been reviewed, patient examined, no change in status, stable for surgery.  I have reviewed the patient's chart and labs.  Questions were answered to the patient's satisfaction.     Belva Crome III

## 2020-05-25 NOTE — Progress Notes (Signed)
  Echocardiogram 2D Echocardiogram has been performed.  Robert [name] 05/19/2020, 9:34 AM

## 2020-05-26 ENCOUNTER — Inpatient Hospital Stay (HOSPITAL_COMMUNITY): Payer: Medicare HMO

## 2020-05-26 DIAGNOSIS — I313 Pericardial effusion (noninflammatory): Secondary | ICD-10-CM

## 2020-05-26 DIAGNOSIS — J431 Panlobular emphysema: Secondary | ICD-10-CM

## 2020-05-26 DIAGNOSIS — E1165 Type 2 diabetes mellitus with hyperglycemia: Secondary | ICD-10-CM

## 2020-05-26 DIAGNOSIS — I314 Cardiac tamponade: Secondary | ICD-10-CM

## 2020-05-26 LAB — COMPREHENSIVE METABOLIC PANEL
ALT: 230 U/L — ABNORMAL HIGH (ref 0–44)
AST: 115 U/L — ABNORMAL HIGH (ref 15–41)
Albumin: 2.6 g/dL — ABNORMAL LOW (ref 3.5–5.0)
Alkaline Phosphatase: 149 U/L — ABNORMAL HIGH (ref 38–126)
Anion gap: 8 (ref 5–15)
BUN: 114 mg/dL — ABNORMAL HIGH (ref 8–23)
CO2: 26 mmol/L (ref 22–32)
Calcium: 8.6 mg/dL — ABNORMAL LOW (ref 8.9–10.3)
Chloride: 98 mmol/L (ref 98–111)
Creatinine, Ser: 2.18 mg/dL — ABNORMAL HIGH (ref 0.61–1.24)
GFR calc Af Amer: 35 mL/min — ABNORMAL LOW (ref >60)
GFR calc non Af Amer: 30 mL/min — ABNORMAL LOW (ref >60)
Glucose, Bld: 333 mg/dL — ABNORMAL HIGH (ref 70–99)
Potassium: 5.4 mmol/L — ABNORMAL HIGH (ref 3.5–5.1)
Sodium: 132 mmol/L — ABNORMAL LOW (ref 135–145)
Total Bilirubin: 0.6 mg/dL (ref 0.3–1.2)
Total Protein: 5.6 g/dL — ABNORMAL LOW (ref 6.5–8.1)

## 2020-05-26 LAB — CYTOLOGY - NON PAP

## 2020-05-26 LAB — GLUCOSE, CAPILLARY
Glucose-Capillary: 107 mg/dL — ABNORMAL HIGH (ref 70–99)
Glucose-Capillary: 124 mg/dL — ABNORMAL HIGH (ref 70–99)
Glucose-Capillary: 153 mg/dL — ABNORMAL HIGH (ref 70–99)
Glucose-Capillary: 178 mg/dL — ABNORMAL HIGH (ref 70–99)
Glucose-Capillary: 186 mg/dL — ABNORMAL HIGH (ref 70–99)
Glucose-Capillary: 353 mg/dL — ABNORMAL HIGH (ref 70–99)

## 2020-05-26 LAB — MAGNESIUM: Magnesium: 2.6 mg/dL — ABNORMAL HIGH (ref 1.7–2.4)

## 2020-05-26 LAB — CBC
HCT: 40.8 % (ref 39.0–52.0)
Hemoglobin: 13.2 g/dL (ref 13.0–17.0)
MCH: 30.4 pg (ref 26.0–34.0)
MCHC: 32.4 g/dL (ref 30.0–36.0)
MCV: 94 fL (ref 80.0–100.0)
Platelets: 229 10*3/uL (ref 150–400)
RBC: 4.34 MIL/uL (ref 4.22–5.81)
RDW: 14.5 % (ref 11.5–15.5)
WBC: 14.6 10*3/uL — ABNORMAL HIGH (ref 4.0–10.5)
nRBC: 0 % (ref 0.0–0.2)

## 2020-05-26 LAB — PROTEIN, BODY FLUID (OTHER): Total Protein, Body Fluid Other: 4.1 g/dL

## 2020-05-26 LAB — GLUCOSE, BODY FLUID OTHER: Glucose, Body Fluid Other: 82 mg/dL

## 2020-05-26 LAB — LD, BODY FLUID (OTHER): LD, Body Fluid: 940 IU/L

## 2020-05-26 LAB — ECHOCARDIOGRAM LIMITED
Height: 72 in
Weight: 4208.14 oz

## 2020-05-26 LAB — PLATELET INHIBITION P2Y12: Platelet Function  P2Y12: 120 [PRU] — ABNORMAL LOW (ref 182–335)

## 2020-05-26 LAB — PH, BODY FLUID: pH, Body Fluid: 7.2

## 2020-05-26 MED ORDER — INSULIN ASPART 100 UNIT/ML ~~LOC~~ SOLN
0.0000 [IU] | Freq: Three times a day (TID) | SUBCUTANEOUS | Status: DC
  Administered 2020-05-26: 3 [IU] via SUBCUTANEOUS
  Administered 2020-05-27: 15 [IU] via SUBCUTANEOUS
  Administered 2020-05-27: 20 [IU] via SUBCUTANEOUS
  Administered 2020-05-27: 7 [IU] via SUBCUTANEOUS
  Administered 2020-05-28: 11 [IU] via SUBCUTANEOUS
  Administered 2020-05-28: 7 [IU] via SUBCUTANEOUS
  Administered 2020-05-28: 3 [IU] via SUBCUTANEOUS
  Administered 2020-05-29: 4 [IU] via SUBCUTANEOUS
  Administered 2020-05-29: 7 [IU] via SUBCUTANEOUS
  Administered 2020-05-29 – 2020-05-30 (×3): 4 [IU] via SUBCUTANEOUS
  Administered 2020-05-30 – 2020-05-31 (×3): 3 [IU] via SUBCUTANEOUS
  Administered 2020-05-31: 11 [IU] via SUBCUTANEOUS
  Administered 2020-06-01: 15 [IU] via SUBCUTANEOUS
  Administered 2020-06-01 – 2020-06-02 (×3): 4 [IU] via SUBCUTANEOUS
  Administered 2020-06-02: 7 [IU] via SUBCUTANEOUS
  Administered 2020-06-02: 11 [IU] via SUBCUTANEOUS
  Administered 2020-06-03: 20 [IU] via SUBCUTANEOUS
  Administered 2020-06-03: 11 [IU] via SUBCUTANEOUS
  Administered 2020-06-04: 15 [IU] via SUBCUTANEOUS
  Administered 2020-06-04: 20 [IU] via SUBCUTANEOUS
  Administered 2020-06-04: 4 [IU] via SUBCUTANEOUS
  Administered 2020-06-05: 7 [IU] via SUBCUTANEOUS
  Administered 2020-06-05: 20 [IU] via SUBCUTANEOUS
  Administered 2020-06-05: 11 [IU] via SUBCUTANEOUS
  Administered 2020-06-06: 20 [IU] via SUBCUTANEOUS
  Administered 2020-06-06: 11 [IU] via SUBCUTANEOUS
  Administered 2020-06-06: 20 [IU] via SUBCUTANEOUS
  Administered 2020-06-07: 11 [IU] via SUBCUTANEOUS
  Administered 2020-06-07: 7 [IU] via SUBCUTANEOUS
  Administered 2020-06-07: 3 [IU] via SUBCUTANEOUS
  Administered 2020-06-08: 4 [IU] via SUBCUTANEOUS
  Administered 2020-06-08: 7 [IU] via SUBCUTANEOUS
  Administered 2020-06-10: 3 [IU] via SUBCUTANEOUS
  Administered 2020-06-10: 15 [IU] via SUBCUTANEOUS
  Administered 2020-06-11: 4 [IU] via SUBCUTANEOUS
  Administered 2020-06-11: 7 [IU] via SUBCUTANEOUS
  Administered 2020-06-11: 3 [IU] via SUBCUTANEOUS
  Administered 2020-06-12: 7 [IU] via SUBCUTANEOUS
  Administered 2020-06-12: 15 [IU] via SUBCUTANEOUS
  Administered 2020-06-13: 4 [IU] via SUBCUTANEOUS
  Administered 2020-06-13: 11 [IU] via SUBCUTANEOUS
  Administered 2020-06-13: 7 [IU] via SUBCUTANEOUS
  Administered 2020-06-14: 4 [IU] via SUBCUTANEOUS
  Administered 2020-06-14: 11 [IU] via SUBCUTANEOUS
  Administered 2020-06-14: 4 [IU] via SUBCUTANEOUS
  Administered 2020-06-15 (×3): 7 [IU] via SUBCUTANEOUS

## 2020-05-26 MED ORDER — SODIUM ZIRCONIUM CYCLOSILICATE 10 G PO PACK
10.0000 g | PACK | Freq: Once | ORAL | Status: AC
  Administered 2020-05-26: 10 g via ORAL
  Filled 2020-05-26: qty 1

## 2020-05-26 MED ORDER — FUROSEMIDE 10 MG/ML IJ SOLN
120.0000 mg | Freq: Two times a day (BID) | INTRAVENOUS | Status: DC
  Administered 2020-05-26 – 2020-05-27 (×2): 120 mg via INTRAVENOUS
  Filled 2020-05-26 (×2): qty 10
  Filled 2020-05-26: qty 12

## 2020-05-26 MED ORDER — PERFLUTREN LIPID MICROSPHERE
1.0000 mL | INTRAVENOUS | Status: AC | PRN
  Administered 2020-05-26: 4 mL via INTRAVENOUS
  Filled 2020-05-26: qty 10

## 2020-05-26 MED ORDER — METOLAZONE 5 MG PO TABS
5.0000 mg | ORAL_TABLET | Freq: Once | ORAL | Status: AC
  Administered 2020-05-26: 5 mg via ORAL
  Filled 2020-05-26: qty 1

## 2020-05-26 MED ORDER — AMIODARONE HCL 200 MG PO TABS
200.0000 mg | ORAL_TABLET | Freq: Two times a day (BID) | ORAL | Status: DC
  Administered 2020-05-26 – 2020-06-15 (×42): 200 mg via ORAL
  Filled 2020-05-26 (×42): qty 1

## 2020-05-26 MED ORDER — PERFLUTREN LIPID MICROSPHERE
1.0000 mL | INTRAVENOUS | Status: AC | PRN
  Filled 2020-05-26: qty 10

## 2020-05-26 NOTE — Progress Notes (Signed)
Inpatient Diabetes Program Recommendations  AACE/ADA: New Consensus Statement on Inpatient Glycemic Control   Target Ranges:  Prepandial:   less than 140 mg/dL      Peak postprandial:   less than 180 mg/dL (1-2 hours)      Critically ill patients:  140 - 180 mg/dL  Results for LANELL, DUBIE (MRN [id number]) as of 05/26/2020 10:04  Ref. Range 05/24/20 23:30 06/02/2020 00:52 06/04/2020 04:03 05/13/2020 08:22 05/23/2020 10:45 06/07/2020 13:48 06/08/2020 20:16 05/26/2020 00:07 05/26/2020 04:28 05/26/2020 08:39  Glucose-Capillary Latest Ref Range: 70 - 99 mg/dL     Lantus 20 units 220 (H)  Novolog 7 units 178 (H)  Novolog 4 units 180 (H)  Novolog 4 units       Solumedrol 125 mg 110 (H) 241 (H)  Novolog 7 units 353 (H)  Novolog 20 units 178 (H)  Novolog 4 units 107 (H)  Novolog 10 units    Review of Glycemic Control  Current orders for Inpatient glycemic control: Novolog 0-20 units Q4H, Novolog 6 units TID with meals  Inpatient Diabetes Program Recommendations:    Insulin: Please consider ordering Lantus 20 units daily.  Thanks, Barnie Alderman, RN, MSN, CDE Diabetes Coordinator Inpatient Diabetes Program [phone] (Team Pager from 8am to 5pm)

## 2020-05-26 NOTE — Progress Notes (Signed)
[facility] Progress Note  Name: Robert [name] MRN: [id number] DOB: 07/13/1951   Subjective:  Was transferred from [facility] to [facility]. I missed him on multiple exam attempts on 9/14 due to pericardiocentesis.  He had 2.9 liters UOP on 9/14 charted (a big increase).  He has been on lasix 160 mg IV three times a day.   Review of systems:  Reports shortness of breath Denies cp  Denies n/v   Intake/Output Summary (Last 24 hours) at 05/26/2020 0649 Last data filed at 05/26/2020 0000 Gross per 24 hour  Intake 128.51 ml  Output 2900 ml  Net -2771.49 ml    Vitals:  Vitals:   05/26/20 0300 05/26/20 0400 05/26/20 0500 05/26/20 0600  BP: 107/64 101/82 118/77 116/76  Pulse: 97 95 98 (!) 102  Resp: (!) 24 (!) 25 (!) 25 19  Temp:  98.3 F (36.8 C)    TempSrc:  Oral    SpO2: 96% (!) 89% 94% 91%  Weight:   119.3 kg   Height:         Physical Exam:  General adult male in bed chronically ill appearing HEENT normocephalic atraumatic extraocular movements intact sclera anicteric Neck supple trachea midline Lungs occ wheeze and inc work of breathing with exertion Heart S1S2 tachy Abdomen soft nontender obese habitus Extremities 2-3+ edema bilaterally  Psych normal mood and affect Neuro alert and oriented x3 provides hx and follows commands   Medications reviewed  Labs:  BMP Latest Ref Rng & Units 05/26/2020 06/08/2020 05/27/2020  Glucose 70 - 99 mg/dL 333(H) - -  BUN 8 - 23 mg/dL 114(H) - -  Creatinine 0.61 - 1.24 mg/dL 2.18(H) - -  BUN/Creat Ratio 10 - 24 - - -  Sodium 135 - 145 mmol/L 132(L) 134(L) 133(L)  Potassium 3.5 - 5.1 mmol/L 5.4(H) 5.0 5.0  Chloride 98 - 111 mmol/L 98 - -  CO2 22 - 32 mmol/L 26 - -  Calcium 8.9 - 10.3 mg/dL 8.6(L) - -     Assessment/Plan:   1. AKI on CKD3b  1. AKI 2/2 acute CHF and pericardial effusion 2. Add metolazone 5 mg once now and assess additional dosing 3. Getting lasix 160 mg IV now  4. Adjust lasix to 120 mg IV every  12 hours for now 5. Changed to renal carb modified diet   2. AoC sCHF exacerbation - would consult heart failure.  ? Inotrope, per cardiology.  Diuretics as above   3. Pericardial effusion s/p paracardiocentesis   4. Acute hypoxic resp failure - optimizing diuretics as above; copd per primary  5. Hyperkalemia - diuretics as above.  Changed to renal diet. lokelma once   6. Hyponatremia, hypervolemic - improving   7. DM2 per primary team   8. COPD - decreased resp reserve; per primary  9. AFib, rate control, anticoagulation per primary team   Claudia Desanctis, MD 05/26/2020 7:09 AM

## 2020-05-26 NOTE — Progress Notes (Signed)
  Echocardiogram 2D Echocardiogram has been performed.  Michiel Cowboy 05/26/2020, 1:37 PM

## 2020-05-26 NOTE — Consult Note (Signed)
   [facility] CM Inpatient Consult   05/26/2020  Robert [name] May 12, 1951 [id number]  [employer] [ACO] Patient:  [name]  Active member note:  Patient is currently active with [facility] for chronic disease management services.  Patient has been engaged by a [facility] Management Coordinator.community based plan of care has focused on disease management and community resource support.    Plan:  [facility] Care Management following.  Of note, [facility] Care Management services does not replace or interfere with any services that are needed or arranged by inpatient [facility] care management team.  For additional questions or referrals please contact:  Natividad Brood, RN BSN [facility] Liaison  [phone] business mobile phone Toll free office [phone]  [email or web address] number: [phone] [email or web address]

## 2020-05-26 NOTE — Progress Notes (Signed)
[facility]  PROGRESS NOTE  Robert [name] JQZ:[id number] DOB: 1950-09-16 DOA: 05/23/2020 PCP: Redmond School, MD Admit date - 05/18/2020   Admitting Physician Bonnielee Haff, MD  Outpatient Primary MD for the patient is Redmond School, MD  LOS - 7 Brief Narrative   Robert [name] is a 69 y.o. year old male with medical history significant for HTN, HLD, COPD, CHF, CKD stage IIIb who presented to [facility] on 8/8 with shortness of breath and weight gain over several weeks at that time patient was found to be hypoxic with SPO2 in the 80s on room air and was admitted for acute hypoxic respiratory failure presumed secondary to CHF exacerbation.  Hospital course complicated AKI on CKD in setting of significant volume overload and need for aggressive IV Lasix diuresis with poor urine output.  Patient was also found to have a new EF of 35% on TTE with large circumferential pericardial effusion noted with concern for evidence of developing tamponade   Subjective  Today denies any chest pain.  Feels breathing is stable.  A & P    Pericardial tamponade, status post pericardiocentesis on 9/14.  Minimal drainage currently.  Developed while patient was off home Eliquis -Appreciate cardiology recommendations, monitor drainage output -If fluid returns would need pericardial window  Acute on chronic systolic/diastolic CHF.  Previously difficult to assess his EF in the setting of tamponade physiology due to above -Plan for repeat TTE per cardiology -120 mg IV Lasix twice daily per nephrology, metolazone 5 mg x 1 per nephrology, monitor output -Continue to monitor output, daily weights-currently not a candidate for inotropes due to arrhythmia and ischemic substrate per cardiology  AKI on CKD stage IIIb and mild hyperkalemia.  Baseline creatinine 1.5-1.7.  Creatinine currently 2.18 suspect could be prerenal in the setting of tamponade physiology due to above as well as CHF exacerbation, would  expect improvement given recent pericardiocentesis and aggressive IV diuresis.  Potassium elevated at 5.4 -Lokelma 5 mg, x1, monitor potassium -avoid nephrotoxins, renal/carb modified diet  Acute proximal respiratory failure, likely multifactorial allergy include CH exacerbation, pericardial tamponade, COPD -Continue IV diuretics as mentioned above -Continue Dulera and as needed nebs -Wean O2 as able with goal SPO2 of present 88%  Elevated AST/ALT.  AST 115 ( 125 prior) ALT 230 (227prior) congestive hepatopathy related to CHF extubation on -continue to monitor -Hold home Lipitor -If does not continue to improve can check hepatitis panel  Paroxysmal atrial fibrillation.  CHA2DS2-VASc 5 -holding Eliquis given recent pericardiocentesis -Start oral amiodarone per cardiology, avoid other agents that potentially drop blood pressure and worsening perfusion  COPD.  Has some wheezing on exam likely aspiration in setting of CHF exacerbation, tamponade physiology addressed above.  Has no respiratory effort on 12 L nasal cannula -Duo nebs as needed every 4 hours, Dulera -Flutter valve, incentive spirometer  Hypervolemic hyponatremia in the setting of CHF exacerbation/tamponade physiology, stable -Monitor BMP -Expect improvement with IV diuresis  CAD, stable.  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 -Holding home aspirin, Plavix -TTE to assess EF status post pericardiocentesis  Type 2 diabetes.  FBG of 107 this a.m.,  Well controlled,A1c 7.3 did require -Currently on sliding scale every 4 hours will transition to 3 times daily with meals given patient is no longer n.p.o. and tolerating diet, NovoLog 6 units 3 times daily with meals -Holding home Amaryl and home Toujeo 25 units until ensure fasting blood  sugar able to sustain reinitiation of basal insulin  Peripheral neuropathy secondary to diabetes -continue gabapentin 300  mg 3 times daily  HTN -holding home losartan in setting of AKI -Continue home Lopressor  Family Communication  : None  Code Status : Full  Disposition Plan  :  Patient is from home. Anticipated d/c date:  Greater than 3 days. Barriers to d/c or necessity for inpatient status:  Close monitoring given pericardial drain in place after recent pericardiocentesis, needs aggressive IV diuresis for CHF activation, still requiring high amount of O2 Consults  : CT surgery, cardiology, nephrology  Procedures  : Pericardiocentesis, 9/14  DVT Prophylaxis  : Heparin Lab Results  Component Value Date   PLT 229 05/26/2020    Diet :  Diet Order            Diet renal/carb modified with fluid restriction Diet-HS Snack? Nothing; Fluid restriction: 1200 mL Fluid; Room service appropriate? Yes; Fluid consistency: Thin  Diet effective now                  Inpatient Medications Scheduled Meds: . amiodarone  200 mg Oral BID  . Chlorhexidine Gluconate Cloth  6 each Topical Daily  . fluticasone  1 spray Each Nare Daily  . folic acid  1 mg Oral Daily  . gabapentin  300 mg Oral TID  . heparin  5,000 Units Subcutaneous Q8H  . influenza vaccine adjuvanted  0.5 mL Intramuscular Tomorrow-1000  . insulin aspart  0-20 Units Subcutaneous Q4H  . insulin aspart  6 Units Subcutaneous TID WC  . melatonin  3 mg Oral QHS  . metoprolol tartrate  25 mg Oral BID  . mometasone-formoterol  2 puff Inhalation BID  . primidone  100 mg Oral QHS  . sodium chloride flush  3 mL Intravenous Q12H  . sodium chloride flush  3 mL Intravenous Q12H   Continuous Infusions: . sodium chloride    . sodium chloride    . furosemide     PRN Meds:.sodium chloride, sodium chloride, acetaminophen, diclofenac Sodium, ipratropium-albuterol, Muscle Rub, ondansetron (ZOFRAN) IV, oxyCODONE, perflutren lipid microspheres (DEFINITY) IV suspension, sodium chloride flush, sodium chloride flush  Antibiotics  :   Anti-infectives (From  admission, onward)   None       Objective   Vitals:   05/26/20 1100 05/26/20 1156 05/26/20 1200 05/26/20 1525  BP: 103/69  (!) 102/50   Pulse: 98  (!) 108   Resp: (!) 21  (!) 27   Temp: 98.6 F (37 C) 98.6 F (37 C)  98.4 F (36.9 C)  TempSrc: Oral Oral  Oral  SpO2: 91%  (!) 87%   Weight:      Height:        SpO2: (!) 87 % O2 Flow Rate (L/min): 11 L/min  Wt Readings from Last 3 Encounters:  05/26/20 119.3 kg  05/16/20 111.5 kg  12/30/19 103.5 kg     Intake/Output Summary (Last 24 hours) at 05/26/2020 1526 Last data filed at 05/26/2020 1115 Gross per 24 hour  Intake 488.51 ml  Output 3500 ml  Net -3011.49 ml    Physical Exam:     Awake Alert, Oriented X 3, Normal affect No new F.N deficits,  [place].AT, Normal respiratory effort on 12 L, wheezing heard, no conversational dyspnea RRR,No Gallops,Rubs or new Murmurs,  +ve B.Sounds, Abd Soft, No tenderness, No rebound, guarding or rigidity. 3+ pitting edema of bilateral lower extremities up to mid thigh with weeping of left lower  leg   I have personally reviewed the following:   Data Reviewed:  CBC Recent Labs  Lab 05/20/20 0629 05/20/20 0629 05/21/20 0445 05/21/20 0445 05/23/20 0908 06/05/2020 0910 05/22/2020 1214 06/09/2020 1215 05/26/20 0054  WBC 10.7*  --  8.8  --  9.8 11.0*  --   --  14.6*  HGB 13.9   < > 14.0   < > 14.8 14.3 15.0 14.6 13.2  HCT 43.3   < > 44.3   < > 46.1 44.0 44.0 43.0 40.8  PLT 188  --  211  --  232 268  --   --  229  MCV 96.4  --  96.9  --  96.6 94.8  --   --  94.0  MCH 31.0  --  30.6  --  31.0 30.8  --   --  30.4  MCHC 32.1  --  31.6  --  32.1 32.5  --   --  32.4  RDW 14.5  --  14.6  --  14.6 14.6  --   --  14.5  LYMPHSABS 0.6*  --   --   --   --   --   --   --   --   MONOABS 1.1*  --   --   --   --   --   --   --   --   EOSABS 0.0  --   --   --   --   --   --   --   --   BASOSABS 0.0  --   --   --   --   --   --   --   --    < > = values in this interval not displayed.     Chemistries  Recent Labs  Lab 05/21/20 0445 05/21/20 0445 05/22/20 0840 05/22/20 0840 05/23/20 0908 05/23/20 0908 05/24/20 1005 05/12/2020 0910 05/15/2020 1214 05/12/2020 1215 05/26/20 0054  NA 133*   < > 134*   < > 128*   < > 130* 130* 133* 134* 132*  K 4.2   < > 4.4   < > 4.5   < > 4.7 4.8 5.0 5.0 5.4*  CL 99   < > 97*  --  94*  --  94* 95*  --   --  98  CO2 26   < > 28  --  25  --  25 25  --   --  26  GLUCOSE 171*   < > 254*  --  273*  --  247* 154*  --   --  333*  BUN 73*   < > 82*  --  90*  --  105* 118*  --   --  114*  CREATININE 1.58*   < > 1.69*  --  1.99*  --  2.09* 2.18*  --   --  2.18*  CALCIUM 8.3*   < > 8.5*  --  8.4*  --  8.8* 8.6*  --   --  8.6*  MG 2.6*  --  2.5*  --  2.5*  --  2.6*  --   --   --  2.6*  AST  --   --   --   --   --   --  125*  --   --   --  115*  ALT  --   --   --   --   --   --  227*  --   --   --  230*  ALKPHOS  --   --   --   --   --   --  134*  --   --   --  149*  BILITOT  --   --   --   --   --   --  0.5  --   --   --  0.6   < > = values in this interval not displayed.   ------------------------------------------------------------------------------------------------------------------ No results for input(s): CHOL, HDL, LDLCALC, TRIG, CHOLHDL, LDLDIRECT in the last 72 hours.  Lab Results  Component Value Date   HGBA1C 7.3 (H) 05/20/2020   ------------------------------------------------------------------------------------------------------------------ No results for input(s): TSH, T4TOTAL, T3FREE, THYROIDAB in the last 72 hours.  Invalid input(s): FREET3 ------------------------------------------------------------------------------------------------------------------ No results for input(s): VITAMINB12, FOLATE, FERRITIN, TIBC, IRON, RETICCTPCT in the last 72 hours.  Coagulation profile No results for input(s): INR, PROTIME in the last 168 hours.  No results for input(s): DDIMER in the last 72 hours.  Cardiac Enzymes No results for  input(s): CKMB, TROPONINI, MYOGLOBIN in the last 168 hours.  Invalid input(s): CK ------------------------------------------------------------------------------------------------------------------    Component Value Date/Time   BNP 157.0 (H) 05/23/2020 1310    Micro Results Recent Results (from the past 240 hour(s))  SARS Coronavirus 2 by RT PCR (hospital order, performed in [facility] hospital lab) Nasopharyngeal Nasopharyngeal Swab     Status: None   Collection Time: 05/22/2020  1:20 PM   Specimen: Nasopharyngeal Swab  Result Value Ref Range Status   SARS Coronavirus 2 NEGATIVE NEGATIVE Final    Comment: (NOTE) SARS-CoV-2 target nucleic acids are NOT DETECTED.  The SARS-CoV-2 RNA is generally detectable in upper and lower respiratory specimens during the acute phase of infection. The lowest concentration of SARS-CoV-2 viral copies this assay can detect is 250 copies / mL. A negative result does not preclude SARS-CoV-2 infection and should not be used as the sole basis for treatment or other patient management decisions.  A negative result may occur with improper specimen collection / handling, submission of specimen other than nasopharyngeal swab, presence of viral mutation(s) within the areas targeted by this assay, and inadequate number of viral copies (<250 copies / mL). A negative result must be combined with clinical observations, patient history, and epidemiological information.  Fact Sheet for Patients:   StrictlyIdeas.no  Fact Sheet for Healthcare Providers: BankingDealers.co.za  This test is not yet approved or  cleared by the [place] FDA and has been authorized for detection and/or diagnosis of SARS-CoV-2 by FDA under an Emergency Use Authorization (EUA).  This EUA will remain in effect (meaning this test can be used) for the duration of the COVID-19 declaration under Section 564(b)(1) of the Act, 21  U.S.C. section 360bbb-3(b)(1), unless the authorization is terminated or revoked sooner.  Performed at [facility], [address] [place], [place] [address]   Blood culture (routine x 2)     Status: None   Collection Time: 05/22/2020  1:42 PM   Specimen: BLOOD LEFT HAND  Result Value Ref Range Status   Specimen Description   Final    BLOOD LEFT HAND BOTTLES DRAWN AEROBIC AND ANAEROBIC   Special Requests Blood Culture adequate volume  Final   Culture   Final    NO GROWTH 5 DAYS Performed at [facility], [address] [place], [place] [address]    Report Status 05/24/2020 FINAL  Final  Blood culture (routine x 2)  Status: None   Collection Time: 06/07/2020  1:43 PM   Specimen: Left Antecubital; Blood  Result Value Ref Range Status   Specimen Description   Final    LEFT ANTECUBITAL BOTTLES DRAWN AEROBIC AND ANAEROBIC   Special Requests Blood Culture adequate volume  Final   Culture   Final    NO GROWTH 5 DAYS Performed at [facility], [address] [place], [place] [address]    Report Status 05/24/2020 FINAL  Final  MRSA PCR Screening     Status: None   Collection Time: 05/20/20  5:36 PM   Specimen: Nasopharyngeal  Result Value Ref Range Status   MRSA by PCR NEGATIVE NEGATIVE Final    Comment:        The GeneXpert MRSA Assay (FDA approved for NASAL specimens only), is one component of a comprehensive MRSA colonization surveillance program. It is not intended to diagnose MRSA infection nor to guide or monitor treatment for MRSA infections. Performed at [facility], [address], [place], [place] [address]   Body fluid culture     Status: None (Preliminary result)   Collection Time: 06/05/2020 11:46 AM   Specimen: PATH Cytology Misc. fluid; Body Fluid  Result Value Ref Range Status   Specimen Description FLUID PERICARDIAL EFFUSION  Final   Special Requests NONE  Final   Gram Stain   Final    RARE WBC PRESENT,BOTH PMN AND MONONUCLEAR NO ORGANISMS SEEN     Culture   Final    NO GROWTH < 24 HOURS Performed at [facility], [address], [place], [place] [address]    Report Status PENDING  Incomplete    Radiology Reports CARDIAC CATHETERIZATION  Result Date: 05/26/2020  Hemodynamic findings consistent with mild pulmonary hypertension and tamponade.  CONCLUSIONS: Hemorrhagic pericardial effusion successfully drained with subxiphoid pericardiocentesis.  1000 cc of grossly bloody fluid was removed. Echo demonstration of near complete evacuation of the pericardial space. Hemodynamics post pericardiocentesis:  Right atrial mean 12 mmHg  Right ventricular pressure 47/11 mmHg  Pulmonary artery pressure 46/14 mmHg  Pulmonary vascular resistance 3.55 [place] units, WHO Group 2  Mean pulmonary capillary wedge pressure 16 mmHg  Cardiac output 4.5 L/min RECOMMENDATIONS:  Avoid anticoagulation  Repeat echo in a.m. to consider removing pericardial drain.  If long-term anticoagulation is required, will need pericardial window.   DG Chest Portable 1 View  Result Date: 06/05/2020 CLINICAL DATA:  Chest x-ray 05/16/2020, chest x-ray 07/03/2019 EXAM: PORTABLE CHEST 1 VIEW COMPARISON:  None. FINDINGS: The heart size and mediastinal contours are unchanged with an enlarged cardiac silhouette that may be partially due to a portable AP technique. Aortic arch and descending thoracic aortic calcifications. Vascular hilar fullness. Persistent bilateral perihilar and basilar patchy airspace opacities. Persistent trace left pleural effusion. Nonspecific blunting of the right costophrenic angle. No pneumothorax bilaterally. The visualized skeletal structures are unremarkable. Suggestion of a coronary stent. IMPRESSION: Similar appearing pulmonary edema with suggestion of a left small pleural effusion. Electronically Signed   By: Iven Finn M.D.   On: 05/27/2020 13:16   DG Chest Port 1 View  Result Date: 05/16/2020 CLINICAL DATA:  Shortness of breath. EXAM:  PORTABLE CHEST 1 VIEW COMPARISON:  July 03, 2019. FINDINGS: Stable cardiomegaly with central pulmonary vascular congestion. No pneumothorax is noted. Stable small left pleural effusion or pleural thickening is noted. Increased perihilar and basilar interstitial densities are noted concerning for edema. Bony thorax is unremarkable. IMPRESSION: Stable cardiomegaly with central pulmonary vascular congestion. Increased bilateral  perihilar and basilar interstitial densities are noted concerning for edema. Aortic Atherosclerosis (ICD10-I70.0). Electronically Signed   By: Marijo Conception M.D.   On: 05/16/2020 12:34   ECHOCARDIOGRAM COMPLETE  Result Date: 05/20/2020    ECHOCARDIOGRAM REPORT   Patient Name:   Robert [name] Date of Exam: 05/20/2020 Medical Rec #:  [id number]      Height:       72.0 in Accession #:    [id number]     Weight:       248.7 lb Date of Birth:  February 25, 1951      BSA:          2.337 m Patient Age:    86 years       BP:           112/88 mmHg Patient Gender: M              HR:           91 bpm. Exam Location:  [facility] Procedure: 2D Echo Indications:    CHF-Acute Diastolic 884.16 / S06.30  History:        Patient has prior history of Echocardiogram examinations, most                 recent 05/14/2019. Previous Myocardial Infarction, COPD; Risk                 Factors:Diabetes, Dyslipidemia, Hypertension and Current Smoker.                 Acute Respiratory Failure.  Sonographer:    Leavy Cella RDCS (AE) Referring Phys: [place]  1. Left ventricular ejection fraction, by estimation, is approximately 35%. The left ventricle has moderately decreased function. Left ventricular endocardial border not optimally defined to evaluate regional wall motion. There is mild left ventricular hypertrophy. Left ventricular diastolic parameters are indeterminate.  2. Right ventricular systolic function is normal. The right ventricular size is normal. Tricuspid regurgitation signal is  inadequate for assessing PA pressure.  3. Left atrial size was upper normal.  4. Moderate to large pericardial effusion. The pericardial effusion is circumferential. There is undulation of the right atrial free wall, intermittent diastolic compression of the right ventricular free wall (no collapse), and respiratory variation of mitral outflow consistent with hemodynamic significance, although not diagnostic of tamponade (BP and HR noted above).  5. The mitral valve is grossly normal. No evidence of mitral valve regurgitation.  6. The aortic valve is tricuspid. Aortic valve regurgitation is not visualized.  7. The inferior vena cava is dilated in size with <50% respiratory variability, suggesting right atrial pressure of 15 mmHg. FINDINGS  Left Ventricle: Left ventricular ejection fraction, by estimation, is 35%. The left ventricle has moderately decreased function. Left ventricular endocardial border not optimally defined to evaluate regional wall motion. The left ventricular internal cavity size was normal in size. There is mild left ventricular hypertrophy. Left ventricular diastolic parameters are indeterminate. Right Ventricle: The right ventricular size is normal. No increase in right ventricular wall thickness. Right ventricular systolic function is normal. Tricuspid regurgitation signal is inadequate for assessing PA pressure. Left Atrium: Left atrial size was upper normal. Right Atrium: Right atrial size was normal in size. Pericardium: A moderately sized pericardial effusion is present. The pericardial effusion is circumferential. Mitral Valve: The mitral valve is grossly normal. Mild mitral annular calcification. No evidence of mitral valve regurgitation. Tricuspid Valve: The tricuspid valve is grossly normal. Tricuspid valve regurgitation is trivial.  Aortic Valve: The aortic valve is tricuspid. There is mild to moderate aortic valve annular calcification. Aortic valve regurgitation is not visualized.  Pulmonic Valve: The pulmonic valve was grossly normal. Pulmonic valve regurgitation is trivial. Aorta: The aortic root is normal in size and structure. Venous: The inferior vena cava is dilated in size with less than 50% respiratory variability, suggesting right atrial pressure of 15 mmHg. IAS/Shunts: No atrial level shunt detected by color flow Doppler.  LEFT VENTRICLE PLAX 2D LVIDd:         4.86 cm  Diastology LVIDs:         4.21 cm  LV e' medial:    7.07 cm/s LV PW:         1.66 cm  LV E/e' medial:  13.6 LV IVS:        1.24 cm  LV e' lateral:   6.64 cm/s LVOT diam:     2.10 cm  LV E/e' lateral: 14.5 LVOT Area:     3.46 cm  RIGHT VENTRICLE RV S prime:     12.20 cm/s TAPSE (M-mode): 1.5 cm LEFT ATRIUM             Index       RIGHT ATRIUM           Index LA diam:        3.80 cm 1.63 cm/m  RA Area:     15.70 cm LA Vol (A2C):   83.5 ml 35.73 ml/m RA Volume:   39.50 ml  16.90 ml/m LA Vol (A4C):   53.3 ml 22.80 ml/m LA Biplane Vol: 67.3 ml 28.79 ml/m   AORTA Ao Root diam: 2.90 cm MITRAL VALVE MV Area (PHT): 4.46 cm    SHUNTS MV Decel Time: 170 msec    Systemic Diam: 2.10 cm MV E velocity: 96.40 cm/s MV A velocity: 38.10 cm/s MV E/A ratio:  2.53 Rozann Lesches MD Electronically signed by Rozann Lesches MD Signature Date/Time: 05/20/2020/4:45:40 PM    Final    ECHOCARDIOGRAM LIMITED  Result Date: 05/26/2020    ECHOCARDIOGRAM LIMITED REPORT   Patient Name:   Robert [name] Date of Exam: 05/26/2020 Medical Rec #:  [id number]      Height:       72.0 in Accession #:    [id number]     Weight:       263.0 lb Date of Birth:  Nov 16, 1950      BSA:          2.394 m Patient Age:    43 years       BP:           102/50 mmHg Patient Gender: M              HR:           100 bpm. Exam Location:  Inpatient Procedure: 2D Echo, Cardiac Doppler, Color Doppler and Intracardiac            Opacification Agent Indications:    Pericardial effusion 423.9 / I31.3  History:        Patient has prior history of Echocardiogram examinations,  most                 recent 06/02/2020. CAD and Previous Myocardial Infarction, COPD,                 Arrythmias:non-specific ST changes, Signs/Symptoms:Dyspnea; Risk  Factors:Hypertension, Diabetes, Dyslipidemia and Current Smoker.                 Pericardial effusion.  Sonographer:    Vickie Epley RDCS Referring Phys: [place]  1. Left ventricular ejection fraction, by estimation, is 35 to 40%. The left ventricle has moderately decreased function. The left ventricle demonstrates regional wall motion abnormalities (see scoring diagram/findings for description). There is moderate akinesis of the left ventricular, basal-mid inferior segment.  2. The mitral valve is normal in structure. No evidence of mitral valve regurgitation. No evidence of mitral stenosis.  3. The aortic valve is normal in structure. Aortic valve regurgitation is not visualized. FINDINGS  Left Ventricle: Left ventricular ejection fraction, by estimation, is 35 to 40%. The left ventricle has moderately decreased function. The left ventricle demonstrates regional wall motion abnormalities. Moderate akinesis of the left ventricular, basal-mid inferior segment. Definity contrast agent was given IV to delineate the left ventricular endocardial borders. Mitral Valve: The mitral valve is normal in structure. No evidence of mitral valve stenosis. Aortic Valve: The aortic valve is normal in structure. Aortic valve regurgitation is not visualized. Mertie Moores MD Electronically signed by Mertie Moores MD Signature Date/Time: 05/26/2020/1:47:33 PM    Final    ECHOCARDIOGRAM LIMITED  Result Date: 05/24/2020    ECHOCARDIOGRAM LIMITED REPORT   Patient Name:   Robert [name] Date of Exam: 05/17/2020 Medical Rec #:  [id number]      Height:       72.0 in Accession #:    [id number]     Weight:       259.0 lb Date of Birth:  1950/10/02      BSA:          2.378 m Patient Age:    74 years       BP:           105/72 mmHg Patient  Gender: M              HR:           99 bpm. Exam Location:  Inpatient Procedure: Limited Echo Indications:    Pericardial effusion 423.9 / I31.3  History:        Patient has prior history of Echocardiogram examinations, most                 recent 06/10/2020. CHF, COPD; Risk Factors:Hypertension and                 Dyslipidemia. GERD. Covid 19. Cardiomyopathy.  Sonographer:    Darlina Sicilian RDCS Referring Phys: [place]  Sonographer Comments: Echo performed during PERICARDIOCENTESIS IMPRESSIONS  1. Pre-pericardiocentesis images show moderate to large pericardial effusion more promient anteriorly. Patient underwent pericardiocentesis with post images revealing no residual pericardial effusion. FINDINGS  Pericardium: Pre-pericardiocentesis images show moderate to large pericardial effusion more promient anteriorly. Patient underwent pericardiocentesis with post images revealing no residual pericardial effusion. Fransico Him MD Electronically signed by Fransico Him MD Signature Date/Time: 06/06/2020/1:17:18 PM    Final    ECHOCARDIOGRAM LIMITED  Result Date: 05/31/2020    ECHOCARDIOGRAM LIMITED REPORT   Patient Name:   Robert [name] Date of Exam: 06/05/2020 Medical Rec #:  [id number]      Height:       72.0 in Accession #:    [id number]     Weight:       259.0 lb Date of Birth:  06/04/1951  BSA:          2.378 m Patient Age:    46 years       BP:           104/72 mmHg Patient Gender: M              HR:           92 bpm. Exam Location:  Inpatient Procedure: Limited Echo and Limited Color Doppler Indications:    I31.3 Pericardial effusion  History:        Patient has prior history of Echocardiogram examinations, most                 recent 05/24/2020. Cardiomyopathy and CHF, Previous Myocardial                 Infarction and CAD, COPD; Risk Factors:Hypertension, Diabetes                 and Dyslipidemia. COVID-19. GERD.  Sonographer:    Jonelle Sidle Dance Referring Phys: [place]  1. Left  ventricular ejection fraction, by estimation, is 60 to 65%. The left ventricle has normal function. The left ventricle has no regional wall motion abnormalities.  2. Right ventricular systolic function is normal. The right ventricular size is normal. Tricuspid regurgitation signal is inadequate for assessing PA pressure.  3. Moderate to large pericardial effusion is present measuring 2.29cm at greatest diameter anterior to the RV free wall. The pericardial effusion is circumferential. There is minimal RA inversion but no RA collapse. The RV cavity is small but no obvious  RV diastolic collapse. No MV inflow velocities were performed.  4. The mitral valve is normal in structure. Mild mitral valve regurgitation. No evidence of mitral stenosis.  5. The aortic valve is tricuspid. Aortic valve regurgitation is not visualized. Mild aortic valve sclerosis is present, with no evidence of aortic valve stenosis.  6. The inferior vena cava is dilated in size with <50% respiratory variability, suggesting right atrial pressure of 15 mmHg.  7. No significant change in pericardial effusion from 05/24/2020 FINDINGS  Left Ventricle: Left ventricular ejection fraction, by estimation, is 60 to 65%. The left ventricle has normal function. The left ventricle has no regional wall motion abnormalities. The left ventricular internal cavity size was normal in size. There is  no left ventricular hypertrophy. Right Ventricle: The right ventricular size is normal. No increase in right ventricular wall thickness. Right ventricular systolic function is normal. Tricuspid regurgitation signal is inadequate for assessing PA pressure. Left Atrium: Left atrial size was normal in size. Right Atrium: Right atrial size was normal in size. Pericardium: A moderately sized pericardial effusion is present. The pericardial effusion is circumferential. Mitral Valve: The mitral valve is normal in structure. Mild mitral valve regurgitation. No evidence of mitral  valve stenosis. Tricuspid Valve: The tricuspid valve is normal in structure. Tricuspid valve regurgitation is not demonstrated. No evidence of tricuspid stenosis. Aortic Valve: The aortic valve is tricuspid. Aortic valve regurgitation is not visualized. Mild aortic valve sclerosis is present, with no evidence of aortic valve stenosis. Pulmonic Valve: The pulmonic valve was normal in structure. Pulmonic valve regurgitation is not visualized. No evidence of pulmonic stenosis. Aorta: The aortic root is normal in size and structure. Venous: The inferior vena cava is dilated in size with less than 50% respiratory variability, suggesting right atrial pressure of 15 mmHg. IAS/Shunts: No atrial level shunt detected by color flow Doppler. IVC IVC diam: 3.50 cm  Fransico Him MD Electronically signed by Fransico Him MD Signature Date/Time: 06/03/2020/9:58:41 AM    Final    ECHOCARDIOGRAM LIMITED  Result Date: 05/24/2020    ECHOCARDIOGRAM LIMITED REPORT   Patient Name:   Robert [name] Date of Exam: 05/24/2020 Medical Rec #:  [id number]      Height:       72.0 in Accession #:    [id number]     Weight:       255.3 lb Date of Birth:  June 04, 1951      BSA:          2.363 m Patient Age:    26 years       BP:           118/99 mmHg Patient Gender: M              HR:           106 bpm. Exam Location:  [facility] Procedure: Limited Echo and Cardiac Doppler Indications:    Pericardial effusion 423.9 / I31.3  History:        Patient has prior history of Echocardiogram examinations, most                 recent 05/20/2020. Previous Myocardial Infarction, COPD; Risk                 Factors:Hypertension, Diabetes, Dyslipidemia and Current Smoker.                 Acute Respiratory Failure.  Sonographer:    Alvino Chapel RCS Referring Phys: [place]  1. Left ventricular ejection fraction, by estimation, is 35%. The left ventricle has moderately decreased function.  2. Overall oderate to large circumferential pericardial  effusion.The effusion is large and most prominent anterior to the RV measuring 3.1 cm. Difficult to assess respiratory variation. Poor telemetry tracing, the inflow waves have irregular intervals, cannot distinguish if the respiratory variation is solely secondary to the irregular rhythm. There is RA diastolic indentation without clear collapse. There is indentation of the RV with some restriction of diastolic filling without frank collapse. Findings stable compared to 05/20/20 study.  3. The inferior vena cava is dilated in size with <50% respiratory variability, suggesting right atrial pressure of 15 mmHg. FINDINGS  Left Ventricle: Left ventricular ejection fraction, by estimation, is 35%. The left ventricle has moderately decreased function. Pericardium: Overall oderate to large circumferential pericardial effusion.The effusion is large and most prominent anterior to the RV measuring 3.1 cm. Difficult to assess respiratory variation. Poor telemetry tracing, the inflow waves have irregular intervals, cannot distinguish if the respiratory variation is solely secondary to the irregular rhythm. There is RA diastolic indentation without clear collapse. There is indentation of the RV with some restriction of diastolic filling without frank collapse. Findings stable compared to 05/20/20 study. Venous: The inferior vena cava is dilated in size with less than 50% respiratory variability, suggesting right atrial pressure of 15 mmHg. Carlyle Dolly MD Electronically signed by Carlyle Dolly MD Signature Date/Time: 05/24/2020/12:43:31 PM    Final      Time Spent in minutes  >30     Desiree Hane M.D on 05/26/2020 at 3:26 PM  To page go to [email or web address] - password [facility]

## 2020-05-26 NOTE — Progress Notes (Signed)
Progress Note  Patient Name: Robert [name] Date of Encounter: 05/26/2020  [facility] Cardiologist: Sanda Klein, MD   Subjective   Had 1 liter hemopericardium drain yesterday.  Became very dyspneic around 0400h, when telemetry shows he converted from sinus rhythm to AFib with RVR. Good UO, starting to feel a little batter. Severe dependent edema with skin blistering and oozing from left calf. BP low normal. Creatinine unchanged despite prodigious diuresis (almost 3 l in last 24h, up from <1 l/day for previous 3 days).  Inpatient Medications    Scheduled Meds: . Chlorhexidine Gluconate Cloth  6 each Topical Daily  . fluticasone  1 spray Each Nare Daily  . folic acid  1 mg Oral Daily  . gabapentin  300 mg Oral TID  . heparin  5,000 Units Subcutaneous Q8H  . influenza vaccine adjuvanted  0.5 mL Intramuscular Tomorrow-1000  . insulin aspart  0-20 Units Subcutaneous Q4H  . insulin aspart  6 Units Subcutaneous TID WC  . melatonin  3 mg Oral QHS  . metoprolol tartrate  25 mg Oral BID  . mometasone-formoterol  2 puff Inhalation BID  . primidone  100 mg Oral QHS  . sodium chloride flush  3 mL Intravenous Q12H  . sodium chloride flush  3 mL Intravenous Q12H   Continuous Infusions: . sodium chloride    . sodium chloride    . furosemide     PRN Meds: sodium chloride, sodium chloride, acetaminophen, diclofenac Sodium, ipratropium-albuterol, Muscle Rub, ondansetron (ZOFRAN) IV, oxyCODONE, sodium chloride flush, sodium chloride flush   Vital Signs    Vitals:   05/26/20 0400 05/26/20 0500 05/26/20 0600 05/26/20 0820  BP: 101/82 118/77 116/76 110/62  Pulse: 95 98 (!) 102 93  Resp: (!) 25 (!) 25 19 (!) 28  Temp: 98.3 F (36.8 C)     TempSrc: Oral     SpO2: (!) 89% 94% 91% 90%  Weight:  119.3 kg    Height:        Intake/Output Summary (Last 24 hours) at 05/26/2020 0824 Last data filed at 05/26/2020 0755 Gross per 24 hour  Intake 128.51 ml  Output 3500 ml  Net  -3371.49 ml   Last 3 Weights 05/26/2020 05/30/2020 05/24/2020  Weight (lbs) 263 lb 0.1 oz 259 lb 0.7 oz 258 lb  Weight (kg) 119.3 kg 117.5 kg 117.028 kg      Telemetry    Afib with mild RVR - Personally Reviewed  ECG    No new tracing - Personally Reviewed  Physical Exam  Looks tired, still orthopneic GEN: No acute distress.   Neck: 10 cm JVD Cardiac: irregular, no murmurs, rubs, or gallops.  Respiratory: Clear to auscultation bilaterally. GI: Soft, nontender, non-distended  MS: 4+ edema both legs, blisters/oozing on L (chronically more swollen); No deformity. Neuro:  Nonfocal  Psych: Normal affect   Labs    High Sensitivity Troponin:   Recent Labs  Lab 05/16/20 1228 05/16/20 1436 05/22/2020 1310 06/05/2020 1653  TROPONINIHS 10 10 9 10       Chemistry Recent Labs  Lab 05/24/20 1005 05/24/20 1005 06/05/2020 0910 05/19/2020 0910 05/19/2020 1214 05/14/2020 1215 05/26/20 0054  NA 130*   < > 130*   < > 133* 134* 132*  K 4.7   < > 4.8   < > 5.0 5.0 5.4*  CL 94*  --  95*  --   --   --  98  CO2 25  --  25  --   --   --  26  GLUCOSE 247*  --  154*  --   --   --  333*  BUN 105*  --  118*  --   --   --  114*  CREATININE 2.09*  --  2.18*  --   --   --  2.18*  CALCIUM 8.8*  --  8.6*  --   --   --  8.6*  PROT 6.3*  --   --   --   --   --  5.6*  ALBUMIN 3.1*  --   --   --   --   --  2.6*  AST 125*  --   --   --   --   --  115*  ALT 227*  --   --   --   --   --  230*  ALKPHOS 134*  --   --   --   --   --  149*  BILITOT 0.5  --   --   --   --   --  0.6  GFRNONAA 32*  --  30*  --   --   --  30*  GFRAA 37*  --  35*  --   --   --  35*  ANIONGAP 11  --  10  --   --   --  8   < > = values in this interval not displayed.     Hematology Recent Labs  Lab 05/23/20 0908 05/23/20 0908 05/19/2020 0910 05/12/2020 0910 05/17/2020 1214 05/17/2020 1215 05/26/20 0054  WBC 9.8  --  11.0*  --   --   --  14.6*  RBC 4.77  --  4.64  --   --   --  4.34  HGB 14.8   < > 14.3   < > 15.0 14.6 13.2  HCT  46.1   < > 44.0   < > 44.0 43.0 40.8  MCV 96.6  --  94.8  --   --   --  94.0  MCH 31.0  --  30.8  --   --   --  30.4  MCHC 32.1  --  32.5  --   --   --  32.4  RDW 14.6  --  14.6  --   --   --  14.5  PLT 232  --  268  --   --   --  229   < > = values in this interval not displayed.    BNP Recent Labs  Lab 05/22/2020 1310  BNP 157.0*     DDimer No results for input(s): DDIMER in the last 168 hours.   Radiology    CARDIAC CATHETERIZATION  Result Date: 06/09/2020  Hemodynamic findings consistent with mild pulmonary hypertension and tamponade.  CONCLUSIONS: Hemorrhagic pericardial effusion successfully drained with subxiphoid pericardiocentesis.  1000 cc of grossly bloody fluid was removed. Echo demonstration of near complete evacuation of the pericardial space. Hemodynamics post pericardiocentesis:  Right atrial mean 12 mmHg  Right ventricular pressure 47/11 mmHg  Pulmonary artery pressure 46/14 mmHg  Pulmonary vascular resistance 3.55 [place] units, WHO Group 2  Mean pulmonary capillary wedge pressure 16 mmHg  Cardiac output 4.5 L/min RECOMMENDATIONS:  Avoid anticoagulation  Repeat echo in a.m. to consider removing pericardial drain.  If long-term anticoagulation is required, will need pericardial window.   ECHOCARDIOGRAM LIMITED  Result Date: 05/30/2020    ECHOCARDIOGRAM LIMITED REPORT   Patient Name:   WILLIS HOLQUIN Date  of Exam: 06/06/2020 Medical Rec #:  [id number]      Height:       72.0 in Accession #:    [id number]     Weight:       259.0 lb Date of Birth:  1950-11-08      BSA:          2.378 m Patient Age:    69 years       BP:           105/72 mmHg Patient Gender: M              HR:           99 bpm. Exam Location:  Inpatient Procedure: Limited Echo Indications:    Pericardial effusion 423.9 / I31.3  History:        Patient has prior history of Echocardiogram examinations, most                 recent 05/30/2020. CHF, COPD; Risk Factors:Hypertension and                  Dyslipidemia. GERD. Covid 19. Cardiomyopathy.  Sonographer:    Darlina Sicilian RDCS Referring Phys: [place]  Sonographer Comments: Echo performed during PERICARDIOCENTESIS IMPRESSIONS  1. Pre-pericardiocentesis images show moderate to large pericardial effusion more promient anteriorly. Patient underwent pericardiocentesis with post images revealing no residual pericardial effusion. FINDINGS  Pericardium: Pre-pericardiocentesis images show moderate to large pericardial effusion more promient anteriorly. Patient underwent pericardiocentesis with post images revealing no residual pericardial effusion. Fransico Him MD Electronically signed by Fransico Him MD Signature Date/Time: 06/01/2020/1:17:18 PM    Final    ECHOCARDIOGRAM LIMITED  Result Date: 06/09/2020    ECHOCARDIOGRAM LIMITED REPORT   Patient Name:   TASHI BAND Date of Exam: 05/18/2020 Medical Rec #:  [id number]      Height:       72.0 in Accession #:    [id number]     Weight:       259.0 lb Date of Birth:  11/29/1950      BSA:          2.378 m Patient Age:    39 years       BP:           104/72 mmHg Patient Gender: M              HR:           92 bpm. Exam Location:  Inpatient Procedure: Limited Echo and Limited Color Doppler Indications:    I31.3 Pericardial effusion  History:        Patient has prior history of Echocardiogram examinations, most                 recent 05/24/2020. Cardiomyopathy and CHF, Previous Myocardial                 Infarction and CAD, COPD; Risk Factors:Hypertension, Diabetes                 and Dyslipidemia. COVID-19. GERD.  Sonographer:    Jonelle Sidle Dance Referring Phys: [place]  1. Left ventricular ejection fraction, by estimation, is 60 to 65%. The left ventricle has normal function. The left ventricle has no regional wall motion abnormalities.  2. Right ventricular systolic function is normal. The right ventricular size is normal. Tricuspid regurgitation signal is inadequate for assessing PA  pressure.  3. Moderate to large pericardial effusion  is present measuring 2.29cm at greatest diameter anterior to the RV free wall. The pericardial effusion is circumferential. There is minimal RA inversion but no RA collapse. The RV cavity is small but no obvious  RV diastolic collapse. No MV inflow velocities were performed.  4. The mitral valve is normal in structure. Mild mitral valve regurgitation. No evidence of mitral stenosis.  5. The aortic valve is tricuspid. Aortic valve regurgitation is not visualized. Mild aortic valve sclerosis is present, with no evidence of aortic valve stenosis.  6. The inferior vena cava is dilated in size with <50% respiratory variability, suggesting right atrial pressure of 15 mmHg.  7. No significant change in pericardial effusion from 05/24/2020 FINDINGS  Left Ventricle: Left ventricular ejection fraction, by estimation, is 60 to 65%. The left ventricle has normal function. The left ventricle has no regional wall motion abnormalities. The left ventricular internal cavity size was normal in size. There is  no left ventricular hypertrophy. Right Ventricle: The right ventricular size is normal. No increase in right ventricular wall thickness. Right ventricular systolic function is normal. Tricuspid regurgitation signal is inadequate for assessing PA pressure. Left Atrium: Left atrial size was normal in size. Right Atrium: Right atrial size was normal in size. Pericardium: A moderately sized pericardial effusion is present. The pericardial effusion is circumferential. Mitral Valve: The mitral valve is normal in structure. Mild mitral valve regurgitation. No evidence of mitral valve stenosis. Tricuspid Valve: The tricuspid valve is normal in structure. Tricuspid valve regurgitation is not demonstrated. No evidence of tricuspid stenosis. Aortic Valve: The aortic valve is tricuspid. Aortic valve regurgitation is not visualized. Mild aortic valve sclerosis is present, with no evidence  of aortic valve stenosis. Pulmonic Valve: The pulmonic valve was normal in structure. Pulmonic valve regurgitation is not visualized. No evidence of pulmonic stenosis. Aorta: The aortic root is normal in size and structure. Venous: The inferior vena cava is dilated in size with less than 50% respiratory variability, suggesting right atrial pressure of 15 mmHg. IAS/Shunts: No atrial level shunt detected by color flow Doppler. IVC IVC diam: 3.50 cm Fransico Him MD Electronically signed by Fransico Him MD Signature Date/Time: 05/14/2020/9:58:41 AM    Final    ECHOCARDIOGRAM LIMITED  Result Date: 05/24/2020    ECHOCARDIOGRAM LIMITED REPORT   Patient Name:   AYUUB PENLEY Date of Exam: 05/24/2020 Medical Rec #:  [id number]      Height:       72.0 in Accession #:    [id number]     Weight:       255.3 lb Date of Birth:  1951/04/20      BSA:          2.363 m Patient Age:    55 years       BP:           118/99 mmHg Patient Gender: M              HR:           106 bpm. Exam Location:  [facility] Procedure: Limited Echo and Cardiac Doppler Indications:    Pericardial effusion 423.9 / I31.3  History:        Patient has prior history of Echocardiogram examinations, most                 recent 05/20/2020. Previous Myocardial Infarction, COPD; Risk                 Factors:Hypertension, Diabetes, Dyslipidemia and  Current Smoker.                 Acute Respiratory Failure.  Sonographer:    Alvino Chapel RCS Referring Phys: [place]  1. Left ventricular ejection fraction, by estimation, is 35%. The left ventricle has moderately decreased function.  2. Overall oderate to large circumferential pericardial effusion.The effusion is large and most prominent anterior to the RV measuring 3.1 cm. Difficult to assess respiratory variation. Poor telemetry tracing, the inflow waves have irregular intervals, cannot distinguish if the respiratory variation is solely secondary to the irregular rhythm. There is RA diastolic  indentation without clear collapse. There is indentation of the RV with some restriction of diastolic filling without frank collapse. Findings stable compared to 05/20/20 study.  3. The inferior vena cava is dilated in size with <50% respiratory variability, suggesting right atrial pressure of 15 mmHg. FINDINGS  Left Ventricle: Left ventricular ejection fraction, by estimation, is 35%. The left ventricle has moderately decreased function. Pericardium: Overall oderate to large circumferential pericardial effusion.The effusion is large and most prominent anterior to the RV measuring 3.1 cm. Difficult to assess respiratory variation. Poor telemetry tracing, the inflow waves have irregular intervals, cannot distinguish if the respiratory variation is solely secondary to the irregular rhythm. There is RA diastolic indentation without clear collapse. There is indentation of the RV with some restriction of diastolic filling without frank collapse. Findings stable compared to 05/20/20 study. Venous: The inferior vena cava is dilated in size with less than 50% respiratory variability, suggesting right atrial pressure of 15 mmHg. Carlyle Dolly MD Electronically signed by Carlyle Dolly MD Signature Date/Time: 05/24/2020/12:43:31 PM    Final     Cardiac Studies   Echo f/u pending today  Patient Profile     69 y.o. male with pericardial tamponade and biventricular failure complicated by acute on CKD, paroxysmal atrial fibrillation, on background of longstanding CAD and COPD, DM, PAD,  rheumatoid arthritis on immunosuppressive drugs. Pericardiocentesis 1000 mL on 9/14.  Assessment & Plan    1. Pericardial tamponade: successful pericardiocentesis yesterday, very little additional drainage overnight and fluid looks clearer. Etiology unclear (onset before anticoagulation was restarted). Repeat echo today. Pull drain later today or tomorrow morning if no reaccumulation. If fluid returns, needs pericardial window. Await  cytology. Consider evaluation for lung neoplasm with CT (after recovery of renal function). 2. Acute on chronic systolic and diastolic CHF: suspect more issues with pulmonary edema as right heart output improving following relief of tamponade. Aggressive diuresis with loop+thiazide diuretics. It seems LVEF has worsened, but images were challenging with large effusion and COPD. Repeat echo today. Dr. Royce Macadamia rx'ing diuretics. Would hold off inotropes due to arrhythmia and his ischemic substrate, as long as we are diuresing successfully. 3. AKI on CKD: following his COVID-19 infection a year ago, baseline creat seems to be around 1.6. Stable creat now at 2.1 in face of excellent negative fluid balance. Note mild hyperkalemia and hypermagnesemia. 4. PAFib: began around 40h. Anticoagulation is not appropriate currently. Rate control is fair. Will start oral amiodarone, try to avoid agents that would drop BP and worsen renal perfusion. No other appropriate antiarrhythmics due to his comorbid conditions. 5. COPD: NYHA class 2-3a dyspnea even when well compensated from heart point of view. 6. CAD: no angina, but LVEF appears to be lower. Reassess EF after pericardiocentesis.     For questions or updates, please contact [place] Please consult [email or web address] for contact info under  Signed, Sanda Klein, MD  05/26/2020, 8:24 AM

## 2020-05-27 DIAGNOSIS — J439 Emphysema, unspecified: Secondary | ICD-10-CM

## 2020-05-27 DIAGNOSIS — I10 Essential (primary) hypertension: Secondary | ICD-10-CM

## 2020-05-27 LAB — CBC
HCT: 43.1 % (ref 39.0–52.0)
Hemoglobin: 14.1 g/dL (ref 13.0–17.0)
MCH: 31.5 pg (ref 26.0–34.0)
MCHC: 32.7 g/dL (ref 30.0–36.0)
MCV: 96.2 fL (ref 80.0–100.0)
Platelets: 216 10*3/uL (ref 150–400)
RBC: 4.48 MIL/uL (ref 4.22–5.81)
RDW: 14.6 % (ref 11.5–15.5)
WBC: 12.5 10*3/uL — ABNORMAL HIGH (ref 4.0–10.5)
nRBC: 0 % (ref 0.0–0.2)

## 2020-05-27 LAB — COMPREHENSIVE METABOLIC PANEL
ALT: 226 U/L — ABNORMAL HIGH (ref 0–44)
AST: 130 U/L — ABNORMAL HIGH (ref 15–41)
Albumin: 2.8 g/dL — ABNORMAL LOW (ref 3.5–5.0)
Alkaline Phosphatase: 154 U/L — ABNORMAL HIGH (ref 38–126)
Anion gap: 10 (ref 5–15)
BUN: 119 mg/dL — ABNORMAL HIGH (ref 8–23)
CO2: 26 mmol/L (ref 22–32)
Calcium: 8.6 mg/dL — ABNORMAL LOW (ref 8.9–10.3)
Chloride: 97 mmol/L — ABNORMAL LOW (ref 98–111)
Creatinine, Ser: 2.2 mg/dL — ABNORMAL HIGH (ref 0.61–1.24)
GFR calc Af Amer: 35 mL/min — ABNORMAL LOW (ref >60)
GFR calc non Af Amer: 30 mL/min — ABNORMAL LOW (ref >60)
Glucose, Bld: 229 mg/dL — ABNORMAL HIGH (ref 70–99)
Potassium: 5.3 mmol/L — ABNORMAL HIGH (ref 3.5–5.1)
Sodium: 133 mmol/L — ABNORMAL LOW (ref 135–145)
Total Bilirubin: 0.4 mg/dL (ref 0.3–1.2)
Total Protein: 5.9 g/dL — ABNORMAL LOW (ref 6.5–8.1)

## 2020-05-27 LAB — GLUCOSE, CAPILLARY
Glucose-Capillary: 174 mg/dL — ABNORMAL HIGH (ref 70–99)
Glucose-Capillary: 206 mg/dL — ABNORMAL HIGH (ref 70–99)
Glucose-Capillary: 226 mg/dL — ABNORMAL HIGH (ref 70–99)
Glucose-Capillary: 298 mg/dL — ABNORMAL HIGH (ref 70–99)
Glucose-Capillary: 310 mg/dL — ABNORMAL HIGH (ref 70–99)
Glucose-Capillary: 387 mg/dL — ABNORMAL HIGH (ref 70–99)

## 2020-05-27 LAB — MAGNESIUM: Magnesium: 2.6 mg/dL — ABNORMAL HIGH (ref 1.7–2.4)

## 2020-05-27 MED ORDER — METOLAZONE 5 MG PO TABS
5.0000 mg | ORAL_TABLET | Freq: Once | ORAL | Status: AC
  Administered 2020-05-27: 5 mg via ORAL
  Filled 2020-05-27: qty 1

## 2020-05-27 MED ORDER — FUROSEMIDE 10 MG/ML IJ SOLN
120.0000 mg | Freq: Once | INTRAVENOUS | Status: DC
  Filled 2020-05-27: qty 12

## 2020-05-27 MED ORDER — DM-GUAIFENESIN ER 30-600 MG PO TB12
1.0000 | ORAL_TABLET | Freq: Two times a day (BID) | ORAL | Status: DC
  Administered 2020-05-27 – 2020-06-15 (×39): 1 via ORAL
  Filled 2020-05-27 (×39): qty 1

## 2020-05-27 MED ORDER — METOLAZONE 5 MG PO TABS
10.0000 mg | ORAL_TABLET | Freq: Once | ORAL | Status: AC
  Administered 2020-05-27: 10 mg via ORAL
  Filled 2020-05-27: qty 2

## 2020-05-27 MED ORDER — FUROSEMIDE 10 MG/ML IJ SOLN: 120.0000 mg | Freq: Two times a day (BID) | INTRAVENOUS | Status: DC

## 2020-05-27 MED ORDER — SODIUM ZIRCONIUM CYCLOSILICATE 10 G PO PACK
10.0000 g | PACK | Freq: Once | ORAL | Status: AC
  Administered 2020-05-27: 10 g via ORAL
  Filled 2020-05-27: qty 1

## 2020-05-27 MED ORDER — FUROSEMIDE 10 MG/ML IJ SOLN
10.0000 mg/h | INTRAVENOUS | Status: DC
  Administered 2020-05-27 (×2): 10 mg/h via INTRAVENOUS
  Filled 2020-05-27: qty 25

## 2020-05-27 MED ORDER — INSULIN GLARGINE 100 UNIT/ML ~~LOC~~ SOLN
10.0000 [IU] | Freq: Every day | SUBCUTANEOUS | Status: DC
  Administered 2020-05-27 – 2020-06-02 (×7): 10 [IU] via SUBCUTANEOUS
  Filled 2020-05-27 (×7): qty 0.1

## 2020-05-27 MED ORDER — FUROSEMIDE 10 MG/ML IJ SOLN
120.0000 mg | Freq: Two times a day (BID) | INTRAVENOUS | Status: DC
  Filled 2020-05-27: qty 12

## 2020-05-27 NOTE — Progress Notes (Signed)
Inpatient Diabetes Program Recommendations  AACE/ADA: New Consensus Statement on Inpatient Glycemic Control   Target Ranges:  Prepandial:   less than 140 mg/dL      Peak postprandial:   less than 180 mg/dL (1-2 hours)      Critically ill patients:  140 - 180 mg/dL   Results for Robert [name], UPTAIN (MRN [id number]) as of 05/27/2020 06:53  Ref. Range 05/26/2020 00:07 05/26/2020 04:28 05/26/2020 08:39 05/26/2020 11:29 05/26/2020 15:24 05/26/2020 20:42 05/27/2020 00:08 05/27/2020 06:34  Glucose-Capillary Latest Ref Range: 70 - 99 mg/dL 353 (H)  Novolog 20 units 178 (H)  Novolog 4 units 107 (H)  Novolog 10 units 153 (H)  Novolog 10 units 124 (H)  Novolog 9 units 186 (H) 206 (H) 226 (H)   Review of Glycemic Control  Diabetes history: DM2 Outpatient Diabetes medications: Toujeo 25 units QHS, Amaryl 2 mg QAM Current orders for Inpatient glycemic control: Novolog 0-20 units TID with meals, Novolog 6 units TID with meals  Inpatient Diabetes Program Recommendations:    Insulin: Fasting glucose 226 mg/dl this morning. Please consider ordering low dose basal insulin (such as Lantus 10 units Q24H).   Thanks, Barnie Alderman, RN, MSN, CDE Diabetes Coordinator Inpatient Diabetes Program [phone] (Team Pager from 8am to 5pm)

## 2020-05-27 NOTE — Progress Notes (Signed)
Progress Note  Patient Name: Robert [name] Date of Encounter: 05/27/2020  [facility] Cardiologist: Robert Klein, MD   Subjective   Still quite dyspneic, orthopneic. 3+ pedal and tibial edema. No significant drainage from pericardium, fluid now a clear deep pink. Remains in AFib, rate controlled. Excellent UO and stable creat at 2.2. Weight 248.5 lb (baseline weight before tamponade was 219-228 lb).  Inpatient Medications    Scheduled Meds:  amiodarone  200 mg Oral BID   Chlorhexidine Gluconate Cloth  6 each Topical Daily   fluticasone  1 spray Each Nare Daily   folic acid  1 mg Oral Daily   gabapentin  300 mg Oral TID   heparin  5,000 Units Subcutaneous Q8H   influenza vaccine adjuvanted  0.5 mL Intramuscular Tomorrow-1000   insulin aspart  0-20 Units Subcutaneous TID WC   insulin aspart  6 Units Subcutaneous TID WC   melatonin  3 mg Oral QHS   metoprolol tartrate  25 mg Oral BID   mometasone-formoterol  2 puff Inhalation BID   primidone  100 mg Oral QHS   sodium chloride flush  3 mL Intravenous Q12H   sodium chloride flush  3 mL Intravenous Q12H   Continuous Infusions:  sodium chloride     sodium chloride     [START ON 05/28/2020] furosemide     furosemide     PRN Meds: sodium chloride, sodium chloride, acetaminophen, ipratropium-albuterol, Muscle Rub, ondansetron (ZOFRAN) IV, oxyCODONE, sodium chloride flush, sodium chloride flush   Vital Signs    Vitals:   05/27/20 0400 05/27/20 0500 05/27/20 0600 05/27/20 0700  BP: 115/83 114/62 114/68 122/71  Pulse: 96 91 99 99  Resp: (!) 24 (!) 23 (!) 21 (!) 28  Temp: 97.7 F (36.5 C)     TempSrc: Oral     SpO2: 92% 94% (!) 89% 92%  Weight:  112.7 kg    Height:        Intake/Output Summary (Last 24 hours) at 05/27/2020 0749 Last data filed at 05/27/2020 0700 Gross per 24 hour  Intake 720 ml  Output 2575 ml  Net -1855 ml   Last 3 Weights 05/27/2020 05/26/2020 05/31/2020  Weight (lbs) 248 lb  7.3 oz 263 lb 0.1 oz 259 lb 0.7 oz  Weight (kg) 112.7 kg 119.3 kg 117.5 kg      Telemetry    AFib, rate controlled - Personally Reviewed  ECG    No new tracing - Personally Reviewed  Physical Exam  Orthopneic GEN: No acute distress.   Neck: No JVD Cardiac: irregular, no murmurs, rubs, or gallops.  Respiratory: very distant breath sounds, diminished in both bases, but otw clear to auscultation bilaterally. GI: Soft, nontender, non-distended  MS: No edema; No deformity. Neuro:  Nonfocal  Psych: Normal affect   Labs    High Sensitivity Troponin:   Recent Labs  Lab 05/16/20 1228 05/16/20 1436 06/01/2020 1310 06/06/2020 1653  TROPONINIHS 10 10 9 10       Chemistry Recent Labs  Lab 05/24/20 1005 05/24/20 1005 05/14/2020 0910 05/24/2020 1214 05/21/2020 1215 05/26/20 0054 05/27/20 0548  NA 130*   < > 130*   < > 134* 132* 133*  K 4.7   < > 4.8   < > 5.0 5.4* 5.3*  CL 94*   < > 95*  --   --  98 97*  CO2 25   < > 25  --   --  26 26  GLUCOSE 247*   < >  154*  --   --  333* 229*  BUN 105*   < > 118*  --   --  114* 119*  CREATININE 2.09*   < > 2.18*  --   --  2.18* 2.20*  CALCIUM 8.8*   < > 8.6*  --   --  8.6* 8.6*  PROT 6.3*  --   --   --   --  5.6* 5.9*  ALBUMIN 3.1*  --   --   --   --  2.6* 2.8*  AST 125*  --   --   --   --  115* 130*  ALT 227*  --   --   --   --  230* 226*  ALKPHOS 134*  --   --   --   --  149* 154*  BILITOT 0.5  --   --   --   --  0.6 0.4  GFRNONAA 32*   < > 30*  --   --  30* 30*  GFRAA 37*   < > 35*  --   --  35* 35*  ANIONGAP 11   < > 10  --   --  8 10   < > = values in this interval not displayed.     Hematology Recent Labs  Lab 05/14/2020 0910 06/05/2020 1214 06/06/2020 1215 05/26/20 0054 05/27/20 0548  WBC 11.0*  --   --  14.6* 12.5*  RBC 4.64  --   --  4.34 4.48  HGB 14.3   < > 14.6 13.2 14.1  HCT 44.0   < > 43.0 40.8 43.1  MCV 94.8  --   --  94.0 96.2  MCH 30.8  --   --  30.4 31.5  MCHC 32.5  --   --  32.4 32.7  RDW 14.6  --   --  14.5 14.6   PLT 268  --   --  229 216   < > = values in this interval not displayed.    BNPNo results for input(s): BNP, PROBNP in the last 168 hours.   DDimer No results for input(s): DDIMER in the last 168 hours.   Radiology    CARDIAC CATHETERIZATION  Result Date: 06/02/2020  Hemodynamic findings consistent with mild pulmonary hypertension and tamponade.  CONCLUSIONS: Hemorrhagic pericardial effusion successfully drained with subxiphoid pericardiocentesis.  1000 cc of grossly bloody fluid was removed. Echo demonstration of near complete evacuation of the pericardial space. Hemodynamics post pericardiocentesis:  Right atrial mean 12 mmHg  Right ventricular pressure 47/11 mmHg  Pulmonary artery pressure 46/14 mmHg  Pulmonary vascular resistance 3.55 Woods units, WHO Group 2  Mean pulmonary capillary wedge pressure 16 mmHg  Cardiac output 4.5 L/min RECOMMENDATIONS:  Avoid anticoagulation  Repeat echo in a.m. to consider removing pericardial drain.  If long-term anticoagulation is required, will need pericardial window.   ECHOCARDIOGRAM LIMITED  Result Date: 05/26/2020    ECHOCARDIOGRAM LIMITED REPORT   Patient Name:   Robert [name] Date of Exam: 05/26/2020 Medical Rec #:  [id number]      Height:       72.0 in Accession #:    [id number]     Weight:       263.0 lb Date of Birth:  1951-08-11      BSA:          2.394 m Patient Age:    69 years       BP:  102/50 mmHg Patient Gender: M              HR:           100 bpm. Exam Location:  Inpatient Procedure: 2D Echo, Cardiac Doppler, Color Doppler and Intracardiac            Opacification Agent Indications:    Pericardial effusion 423.9 / I31.3  History:        Patient has prior history of Echocardiogram examinations, most                 recent 05/31/2020. CAD and Previous Myocardial Infarction, COPD,                 Arrythmias:non-specific ST changes, Signs/Symptoms:Dyspnea; Risk                 Factors:Hypertension, Diabetes, Dyslipidemia and  Current Smoker.                 Pericardial effusion.  Sonographer:    Robert [name] RDCS Referring Phys: Robert [name]  1. Left ventricular ejection fraction, by estimation, is 35 to 40%. The left ventricle has moderately decreased function. The left ventricle demonstrates regional wall motion abnormalities (see scoring diagram/findings for description). There is moderate akinesis of the left ventricular, basal-mid inferior segment.  2. The mitral valve is normal in structure. No evidence of mitral valve regurgitation. No evidence of mitral stenosis.  3. The aortic valve is normal in structure. Aortic valve regurgitation is not visualized. FINDINGS  Left Ventricle: Left ventricular ejection fraction, by estimation, is 35 to 40%. The left ventricle has moderately decreased function. The left ventricle demonstrates regional wall motion abnormalities. Moderate akinesis of the left ventricular, basal-mid inferior segment. Definity contrast agent was given IV to delineate the left ventricular endocardial borders. Mitral Valve: The mitral valve is normal in structure. No evidence of mitral valve stenosis. Aortic Valve: The aortic valve is normal in structure. Aortic valve regurgitation is not visualized. Mertie Moores MD Electronically signed by Mertie Moores MD Signature Date/Time: 05/26/2020/1:47:33 PM    Final    ECHOCARDIOGRAM LIMITED  Result Date: 05/14/2020    ECHOCARDIOGRAM LIMITED REPORT   Patient Name:   ISREAL MOLINE Date of Exam: 05/27/2020 Medical Rec #:  [id number]      Height:       72.0 in Accession #:    [id number]     Weight:       259.0 lb Date of Birth:  1950/09/15      BSA:          2.378 m Patient Age:    89 years       BP:           105/72 mmHg Patient Gender: M              HR:           99 bpm. Exam Location:  Inpatient Procedure: Limited Echo Indications:    Pericardial effusion 423.9 / I31.3  History:        Patient has prior history of Echocardiogram examinations, most                  recent 06/09/2020. CHF, COPD; Risk Factors:Hypertension and                 Dyslipidemia. GERD. Covid 19. Cardiomyopathy.  Sonographer:    Darlina Sicilian RDCS Referring Phys: [place] Comments: Echo performed  during PERICARDIOCENTESIS IMPRESSIONS  1. Pre-pericardiocentesis images show moderate to large pericardial effusion more promient anteriorly. Patient underwent pericardiocentesis with post images revealing no residual pericardial effusion. FINDINGS  Pericardium: Pre-pericardiocentesis images show moderate to large pericardial effusion more promient anteriorly. Patient underwent pericardiocentesis with post images revealing no residual pericardial effusion. Fransico Him MD Electronically signed by Fransico Him MD Signature Date/Time: 05/28/2020/1:17:18 PM    Final    ECHOCARDIOGRAM LIMITED  Result Date: 05/19/2020    ECHOCARDIOGRAM LIMITED REPORT   Patient Name:   ANANIAS KOLANDER Date of Exam: 06/08/2020 Medical Rec #:  [id number]      Height:       72.0 in Accession #:    [id number]     Weight:       259.0 lb Date of Birth:  08-16-1951      BSA:          2.378 m Patient Age:    64 years       BP:           104/72 mmHg Patient Gender: M              HR:           92 bpm. Exam Location:  Inpatient Procedure: Limited Echo and Limited Color Doppler Indications:    I31.3 Pericardial effusion  History:        Patient has prior history of Echocardiogram examinations, most                 recent 05/24/2020. Cardiomyopathy and CHF, Previous Myocardial                 Infarction and CAD, COPD; Risk Factors:Hypertension, Diabetes                 and Dyslipidemia. COVID-19. GERD.  Sonographer:    Jonelle Sidle Dance Referring Phys: [place]  1. Left ventricular ejection fraction, by estimation, is 60 to 65%. The left ventricle has normal function. The left ventricle has no regional wall motion abnormalities.  2. Right ventricular systolic function is normal. The right ventricular  size is normal. Tricuspid regurgitation signal is inadequate for assessing PA pressure.  3. Moderate to large pericardial effusion is present measuring 2.29cm at greatest diameter anterior to the RV free wall. The pericardial effusion is circumferential. There is minimal RA inversion but no RA collapse. The RV cavity is small but no obvious  RV diastolic collapse. No MV inflow velocities were performed.  4. The mitral valve is normal in structure. Mild mitral valve regurgitation. No evidence of mitral stenosis.  5. The aortic valve is tricuspid. Aortic valve regurgitation is not visualized. Mild aortic valve sclerosis is present, with no evidence of aortic valve stenosis.  6. The inferior vena cava is dilated in size with <50% respiratory variability, suggesting right atrial pressure of 15 mmHg.  7. No significant change in pericardial effusion from 05/24/2020 FINDINGS  Left Ventricle: Left ventricular ejection fraction, by estimation, is 60 to 65%. The left ventricle has normal function. The left ventricle has no regional wall motion abnormalities. The left ventricular internal cavity size was normal in size. There is  no left ventricular hypertrophy. Right Ventricle: The right ventricular size is normal. No increase in right ventricular wall thickness. Right ventricular systolic function is normal. Tricuspid regurgitation signal is inadequate for assessing PA pressure. Left Atrium: Left atrial size was normal in size. Right Atrium: Right atrial size was normal in size. Pericardium:  A moderately sized pericardial effusion is present. The pericardial effusion is circumferential. Mitral Valve: The mitral valve is normal in structure. Mild mitral valve regurgitation. No evidence of mitral valve stenosis. Tricuspid Valve: The tricuspid valve is normal in structure. Tricuspid valve regurgitation is not demonstrated. No evidence of tricuspid stenosis. Aortic Valve: The aortic valve is tricuspid. Aortic valve regurgitation  is not visualized. Mild aortic valve sclerosis is present, with no evidence of aortic valve stenosis. Pulmonic Valve: The pulmonic valve was normal in structure. Pulmonic valve regurgitation is not visualized. No evidence of pulmonic stenosis. Aorta: The aortic root is normal in size and structure. Venous: The inferior vena cava is dilated in size with less than 50% respiratory variability, suggesting right atrial pressure of 15 mmHg. IAS/Shunts: No atrial level shunt detected by color flow Doppler. IVC IVC diam: 3.50 cm Fransico Him MD Electronically signed by Fransico Him MD Signature Date/Time: 06/06/2020/9:58:41 AM    Final     Cardiac Studies   As above  Patient Profile     69 y.o. male with pericardial tamponade and biventricular failure complicated by acute on CKD, paroxysmal atrial fibrillation, on background of longstanding CAD and COPD, DM, PAD,  rheumatoid arthritis on immunosuppressive drugs. Pericardiocentesis 1000 mL on 9/14.  Assessment & Plan    1. Pericardial tamponade: will DC drain today (very little additional drainage overnight and fluid looks clearer). Etiology unclear (onset before anticoagulation was restarted).  If fluid returns, needs pericardial window. Cytology: - "No malignant cells identified, Satisfactory but limited for evaluation, scant cellularity". Consider evaluation for lung neoplasm with CT (after recovery of renal function). 2. Acute on chronic systolic and diastolic CHF: at least 20 lb above usual dry weight. Would hold off inotropes due to arrhythmia and his ischemic substrate, as long as we are diuresing successfully. 3. AKI on CKD: following his COVID-19 infection a year ago, baseline creat seems to be around 1.6. Stable creat now at 2.1-2.2, BUN 110-120 in face of excellent negative fluid balance. Note mild hyperkalemia and hypermagnesemia. 4. PAFib: began around 33h. Anticoagulation is not appropriate currently. Rate control is fair. Will start oral  amiodarone, try to avoid agents that would drop BP and worsen renal perfusion. No other appropriate antiarrhythmics due to his comorbid conditions. 5. COPD: NYHA class 2-3a dyspnea even when well compensated from heart point of view. Was not on O2 at home. 6. CAD: no angina, but LVEF appears to be lower. Ischemic reevaluation after recovery. Not a candidate for angiography right now. His wall motion abnormality matches the expected infarction in the left circumflex territory. Probably [place] as OP in a few weeks.     For questions or updates, please contact [place] Please consult [email or web address] for contact info under        Signed, Robert Klein, MD  05/27/2020, 7:49 AM

## 2020-05-27 NOTE — Plan of Care (Signed)
  Problem: Education: Goal: Knowledge of General Education information will improve Description: Including pain rating scale, medication(s)/side effects and non-pharmacologic comfort measures Outcome: Progressing   Problem: Clinical Measurements: Goal: Will remain free from infection Outcome: Progressing Goal: Diagnostic test results will improve Outcome: Progressing Goal: Cardiovascular complication will be avoided Outcome: Progressing   Problem: Nutrition: Goal: Adequate nutrition will be maintained Outcome: Progressing   Problem: Coping: Goal: Level of anxiety will decrease Outcome: Progressing   Problem: Elimination: Goal: Will not experience complications related to bowel motility Outcome: Progressing Goal: Will not experience complications related to urinary retention Outcome: Progressing   Problem: Pain Managment: Goal: General experience of comfort will improve Outcome: Progressing   Problem: Safety: Goal: Ability to remain free from injury will improve Outcome: Progressing   Problem: Education: Goal: Ability to demonstrate management of disease process will improve Outcome: Progressing Goal: Ability to verbalize understanding of medication therapies will improve Outcome: Progressing   Problem: Cardiac: Goal: Ability to achieve and maintain adequate cardiopulmonary perfusion will improve Outcome: Progressing   Problem: Education: Goal: Understanding of CV disease, CV risk reduction, and recovery process will improve Outcome: Progressing   Problem: Cardiovascular: Goal: Ability to achieve and maintain adequate cardiovascular perfusion will improve Outcome: Progressing Goal: Vascular access site(s) Level 0-1 will be maintained Outcome: Progressing   Problem: Health Behavior/Discharge Planning: Goal: Ability to manage health-related needs will improve Outcome: Not Progressing   Problem: Clinical Measurements: Goal: Ability to maintain clinical  measurements within normal limits will improve Outcome: Not Progressing Goal: Respiratory complications will improve Outcome: Not Progressing   Problem: Activity: Goal: Risk for activity intolerance will decrease Outcome: Not Progressing   Problem: Skin Integrity: Goal: Risk for impaired skin integrity will decrease Outcome: Not Progressing   Problem: Activity: Goal: Capacity to carry out activities will improve Outcome: Not Progressing   Problem: Activity: Goal: Ability to return to baseline activity level will improve Outcome: Not Progressing

## 2020-05-27 NOTE — Progress Notes (Signed)
[facility] Progress Note  Name: Robert [name] MRN: [id number] DOB: 1951/01/11   Subjective:  He had 1.7 liters UF day shift on 9/15 (some approximated and per nursing verbal report 9/16) and a toal of 2.2 and 1 unmeasured void over 9/15 as charted now.  He's been on 12 liters oxygen and isn't on any at home.  Does have hx of COPD.  He would want dialysis if it were to become indicated and we discussed the risks, benefits, and indications.   Review of systems:  Reports shortness of breath  Denies cp  Denies n/v   Intake/Output Summary (Last 24 hours) at 05/27/2020 0638 Last data filed at 05/27/2020 0450 Gross per 24 hour  Intake 720 ml  Output 2225 ml  Net -1505 ml    Vitals:  Vitals:   05/27/20 0300 05/27/20 0400 05/27/20 0500 05/27/20 0600  BP: 121/64 115/83 114/62 114/68  Pulse: 92 96 91 99  Resp: (!) 25 (!) 24 (!) 23 (!) 21  Temp:  97.7 F (36.5 C)    TempSrc:  Oral    SpO2: 92% 92% 94% (!) 89%  Weight:   112.7 kg   Height:         Physical Exam:  General adult male in bed chronically ill appearing   HEENT normocephalic atraumatic extraocular movements intact sclera anicteric Neck supple trachea midline Lungs clearer; with occ wheeze and inc work of breathing with exertion Heart S1S2 tachy Abdomen soft nontender obese habitus Extremities 2-3+ edema bilaterally  Psych normal mood and affect Neuro alert and oriented x3 provides hx and follows commands   Medications reviewed  Labs:  BMP Latest Ref Rng & Units 05/26/2020 05/30/2020 06/02/2020  Glucose 70 - 99 mg/dL 333(H) - -  BUN 8 - 23 mg/dL 114(H) - -  Creatinine 0.61 - 1.24 mg/dL 2.18(H) - -  BUN/Creat Ratio 10 - 24 - - -  Sodium 135 - 145 mmol/L 132(L) 134(L) 133(L)  Potassium 3.5 - 5.1 mmol/L 5.4(H) 5.0 5.0  Chloride 98 - 111 mmol/L 98 - -  CO2 22 - 32 mmol/L 26 - -  Calcium 8.9 - 10.3 mg/dL 8.6(L) - -     Assessment/Plan:   1. AKI on CKD3b  1. AKI 2/2 acute CHF and pericardial  effusion.  Improved UOP after pericardiocentesis.  He would want dialysis if it were to become indicated 2. Lasix to 120 mg IV every 12 hours  3. Metolazone 10 mg PO once now 4. Labs pending - monitoring for need for dialysis  5. On renal carb modified diet   2. AoC sCHF exacerbation - appreciate cardiology.  Diuretics as above   3. Pericardial effusion s/p paracardiocentesis   4. Acute hypoxic resp failure - optimizing diuretics as above; copd per primary  5. Hyperkalemia - diuretics as above.  Changed to renal diet  S/p lokelma on 9/15    6. Hyponatremia, hypervolemic - improving   7. DM2 per primary team   8. COPD - decreased resp reserve; per primary  9. AFib, rate control, anticoagulation per primary team   Claudia Desanctis, MD 05/27/2020 6:38 AM

## 2020-05-27 NOTE — Progress Notes (Signed)
[facility]  PROGRESS NOTE  Robert [name] KVQ:[id number] DOB: [birth year] DOA: 05/23/2020 PCP: Redmond School, MD Admit date - 05/15/2020   Admitting Physician Bonnielee Haff, MD  Outpatient Primary MD for the patient is Redmond School, MD  LOS - 8 Brief Narrative   Robert [name] is a 69 y.o. year old male with medical history significant for HTN, HLD, COPD, CHF, CKD stage IIIb who presented to [facility] on 8/8 with shortness of breath and weight gain over several weeks at that time patient was found to be hypoxic with SPO2 in the 80s on room air and was admitted for acute hypoxic respiratory failure presumed secondary to CHF exacerbation.  Hospital course complicated AKI on CKD in setting of significant volume overload and need for aggressive IV Lasix diuresis with poor urine output.  Patient was also found to have a new EF of 35% on TTE with large circumferential pericardial effusion noted with concern for evidence of developing tamponade   Subjective  Today notes his breathing is labored. No chest pain. Feels he needs help bringing up phlegm with his cough  A & P    Pericardial tamponade, status post pericardiocentesis on 9/14.  Drain removed on 9/16 given minimal output  Developed while patient was off home Eliquis. 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  EF 35-40% ( TTE, 05/26/20) with wall motion abnormalities  Attributable to pericardial effusion. Net negative 8 L this admission, still with great output but very volume overloaded on exam with 3+ pitting edema of bilateral lower extremities and persistently high O2 requirments.  -Cards recommmmends outpatient lexiscan for ischemic evaluation after recovery in a few weeks -120 mg IV Lasix twice daily per nephrology, metolazone 10 mg x 1 per nephrology, monitor output -Continue to monitor output,  daily weights -currently not a candidate for inotropes due to arrhythmia and ischemic substrate per cardiology  AKI on CKD stage IIIb and mild hyperkalemia.  Baseline creatinine 1.5-1.7.  Creatinine currently 2.2 and BUN 118.  suspect could be prerenal in the setting of tamponade physiology due to above as well as CHF exacerbation, would expect improvement given recent pericardiocentesis and aggressive IV diuresis.  Potassium elevated at 5.4. Still maintaining adequate output -Lokelma 5 mg, x1, monitor potassium -avoid nephrotoxins, renal/carb modified diet --continue to closely monitor, he would be agreeable to HD if needed  Acute hypoxic respiratory failure, likely multifactorial allergy include CHF exacerbation, pericardial tamponade, COPD, fluid overload with AKI on CKC+D -Continue IV diuretics as mentioned above -Continue Dulera and as needed nebs -Wean O2 as able with goal SPO2 of present 88% --incentive spirometry, flutter valve, mucinex  Elevated AST/ALT.  AST 115 ( 125 prior) ALT 230 (227prior) congestive hepatopathy related to CHF extubation on -continue to monitor -Hold home Lipitor -If does not continue to improve can check hepatitis panel  Paroxysmal atrial fibrillation.  CHA2DS2-VASc 5 -holding Eliquis given recent pericardiocentesis -Start oral amiodarone per cardiology, avoid other agents that potentially drop blood pressure and worsening perfusion  COPD.  Has some wheezing on exam likely aspiration in setting of CHF exacerbation, tamponade physiology addressed above.  Has no respiratory effort on 12 L nasal cannula -Duo nebs as needed every 4 hours, Dulera -Flutter valve, incentive spirometer  Hypervolemic hyponatremia in the setting of CHF exacerbation/tamponade physiology, stable -Monitor BMP -Expect improvement with IV diuresis  CAD, stable.  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 -Holding home  aspirin, Plavix -TTE to assess EF status post pericardiocentesis  Type 2 diabetes.  FBG in 200s this am, hyperglycemic now A1c 7.3  - Continue NovoLog 6 units 3 times daily with meals, Sliding scale Novolog 0-20 U TID with meals --Start Lantus 10 U --closely monitor CBG -Holding home Amaryl and home Toujeo 25 units until ensure fasting blood sugar able to sustain reinitiation of basal insulin  Peripheral neuropathy secondary to diabetes -continue gabapentin 300 mg 3 times daily  HTN -holding home losartan in setting of AKI -Continue home Lopressor  Family Communication  : None  Code Status : Full  Disposition Plan  :  Patient is from home. Anticipated d/c date:  Greater than 3 days. Barriers to d/c or necessity for inpatient status:  Close monitoring  needs aggressive IV diuresis , still requiring high amount of O2 Consults  : CT surgery, cardiology, nephrology  Procedures  : Pericardiocentesis, 9/14  DVT Prophylaxis  : Heparin Lab Results  Component Value Date   PLT 216 05/27/2020    Diet :  Diet Order            Diet renal/carb modified with fluid restriction Diet-HS Snack? Nothing; Fluid restriction: 1200 mL Fluid; Room service appropriate? Yes; Fluid consistency: Thin  Diet effective now                  Inpatient Medications Scheduled Meds: . amiodarone  200 mg Oral BID  . Chlorhexidine Gluconate Cloth  6 each Topical Daily  . fluticasone  1 spray Each Nare Daily  . folic acid  1 mg Oral Daily  . gabapentin  300 mg Oral TID  . heparin  5,000 Units Subcutaneous Q8H  . influenza vaccine adjuvanted  0.5 mL Intramuscular Tomorrow-1000  . insulin aspart  0-20 Units Subcutaneous TID WC  . insulin aspart  6 Units Subcutaneous TID WC  . insulin glargine  10 Units Subcutaneous Daily  . melatonin  3 mg Oral QHS  . metoprolol tartrate  25 mg Oral BID  . mometasone-formoterol  2 puff Inhalation BID  . primidone  100 mg Oral QHS  . sodium chloride flush  3 mL  Intravenous Q12H  . sodium chloride flush  3 mL Intravenous Q12H   Continuous Infusions: . sodium chloride    . sodium chloride    . [START ON 05/28/2020] furosemide    . furosemide     PRN Meds:.sodium chloride, sodium chloride, acetaminophen, ipratropium-albuterol, Muscle Rub, ondansetron (ZOFRAN) IV, oxyCODONE, sodium chloride flush, sodium chloride flush  Antibiotics  :   Anti-infectives (From admission, onward)   None       Objective   Vitals:   05/27/20 1200 05/27/20 1205 05/27/20 1400 05/27/20 1522  BP: 101/75  101/89   Pulse: 89  88   Resp: (!) 30  20   Temp:  (!) 96.1 F (35.6 C)  98.4 F (36.9 C)  TempSrc:  Axillary  Oral  SpO2: (!) 89%  94%   Weight:      Height:        SpO2: 94 % O2 Flow Rate (L/min): 12 L/min  Wt Readings from Last 3 Encounters:  05/27/20 112.7 kg  05/16/20 111.5 kg  12/30/19 103.5 kg     Intake/Output Summary (Last 24 hours) at 05/27/2020 1536 Last data filed at 05/27/2020 1424 Gross per 24 hour  Intake 660 ml  Output 2525  ml  Net -1865 ml    Physical Exam:     Awake Alert, Oriented X 3, Normal affect No new F.N deficits,  [place].AT, Normal respiratory effort on 12 L, rhonchi heard, mild conversational dyspnea RRR,No Gallops,Rubs or new Murmurs,  +ve B.Sounds, Abd Soft, No tenderness, No rebound, guarding or rigidity. 3+ pitting edema of bilateral lower extremities up to mid thigh with weeping of left lower leg and open blister at distal left leg   I have personally reviewed the following:   Data Reviewed:  CBC Recent Labs  Lab 05/21/20 0445 05/21/20 0445 05/23/20 0908 05/23/20 0908 05/26/2020 0910 05/18/2020 1214 05/19/2020 1215 05/26/20 0054 05/27/20 0548  WBC 8.8  --  9.8  --  11.0*  --   --  14.6* 12.5*  HGB 14.0   < > 14.8   < > 14.3 15.0 14.6 13.2 14.1  HCT 44.3   < > 46.1   < > 44.0 44.0 43.0 40.8 43.1  PLT 211  --  232  --  268  --   --  229 216  MCV 96.9  --  96.6  --  94.8  --   --  94.0 96.2  MCH 30.6   --  31.0  --  30.8  --   --  30.4 31.5  MCHC 31.6  --  32.1  --  32.5  --   --  32.4 32.7  RDW 14.6  --  14.6  --  14.6  --   --  14.5 14.6   < > = values in this interval not displayed.    Chemistries  Recent Labs  Lab 05/22/20 0840 05/22/20 0840 05/23/20 0908 05/23/20 0908 05/24/20 1005 05/24/20 1005 05/17/2020 0910 05/24/2020 1214 05/17/2020 1215 05/26/20 0054 05/27/20 0548  NA 134*   < > 128*   < > 130*   < > 130* 133* 134* 132* 133*  K 4.4   < > 4.5   < > 4.7   < > 4.8 5.0 5.0 5.4* 5.3*  CL 97*   < > 94*  --  94*  --  95*  --   --  98 97*  CO2 28   < > 25  --  25  --  25  --   --  26 26  GLUCOSE 254*   < > 273*  --  247*  --  154*  --   --  333* 229*  BUN 82*   < > 90*  --  105*  --  118*  --   --  114* 119*  CREATININE 1.69*   < > 1.99*  --  2.09*  --  2.18*  --   --  2.18* 2.20*  CALCIUM 8.5*   < > 8.4*  --  8.8*  --  8.6*  --   --  8.6* 8.6*  MG 2.5*  --  2.5*  --  2.6*  --   --   --   --  2.6* 2.6*  AST  --   --   --   --  125*  --   --   --   --  115* 130*  ALT  --   --   --   --  227*  --   --   --   --  230* 226*  ALKPHOS  --   --   --   --  134*  --   --   --   --  149* 154*  BILITOT  --   --   --   --  0.5  --   --   --   --  0.6 0.4   < > = values in this interval not displayed.   ------------------------------------------------------------------------------------------------------------------ No results for input(s): CHOL, HDL, LDLCALC, TRIG, CHOLHDL, LDLDIRECT in the last 72 hours.  Lab Results  Component Value Date   HGBA1C 7.3 (H) 05/20/2020   ------------------------------------------------------------------------------------------------------------------ No results for input(s): TSH, T4TOTAL, T3FREE, THYROIDAB in the last 72 hours.  Invalid input(s): FREET3 ------------------------------------------------------------------------------------------------------------------ No results for input(s): VITAMINB12, FOLATE, FERRITIN, TIBC, IRON, RETICCTPCT in the  last 72 hours.  Coagulation profile No results for input(s): INR, PROTIME in the last 168 hours.  No results for input(s): DDIMER in the last 72 hours.  Cardiac Enzymes No results for input(s): CKMB, TROPONINI, MYOGLOBIN in the last 168 hours.  Invalid input(s): CK ------------------------------------------------------------------------------------------------------------------    Component Value Date/Time   BNP 157.0 (H) 05/28/2020 1310    Micro Results Recent Results (from the past 240 hour(s))  SARS Coronavirus 2 by RT PCR (hospital order, performed in [facility] hospital lab) Nasopharyngeal Nasopharyngeal Swab     Status: None   Collection Time: 05/21/2020  1:20 PM   Specimen: Nasopharyngeal Swab  Result Value Ref Range Status   SARS Coronavirus 2 NEGATIVE NEGATIVE Final    Comment: (NOTE) SARS-CoV-2 target nucleic acids are NOT DETECTED.  The SARS-CoV-2 RNA is generally detectable in upper and lower respiratory specimens during the acute phase of infection. The lowest concentration of SARS-CoV-2 viral copies this assay can detect is 250 copies / mL. A negative result does not preclude SARS-CoV-2 infection and should not be used as the sole basis for treatment or other patient management decisions.  A negative result may occur with improper specimen collection / handling, submission of specimen other than nasopharyngeal swab, presence of viral mutation(s) within the areas targeted by this assay, and inadequate number of viral copies (<250 copies / mL). A negative result must be combined with clinical observations, patient history, and epidemiological information.  Fact Sheet for Patients:   StrictlyIdeas.no  Fact Sheet for Healthcare Providers: BankingDealers.co.za  This test is not yet approved or  cleared by the [place] FDA and has been authorized for detection and/or diagnosis of SARS-CoV-2 by FDA under an  Emergency Use Authorization (EUA).  This EUA will remain in effect (meaning this test can be used) for the duration of the COVID-19 declaration under Section 564(b)(1) of the Act, 21 U.S.C. section 360bbb-3(b)(1), unless the authorization is terminated or revoked sooner.  Performed at [facility], [address], [place], [place] [address]   Blood culture (routine x 2)     Status: None   Collection Time: 05/27/2020  1:42 PM   Specimen: BLOOD LEFT HAND  Result Value Ref Range Status   Specimen Description   Final    BLOOD LEFT HAND BOTTLES DRAWN AEROBIC AND ANAEROBIC   Special Requests Blood Culture adequate volume  Final   Culture   Final    NO GROWTH 5 DAYS Performed at [facility], [address] [place], [place] [address]    Report Status 05/24/2020 FINAL  Final  Blood culture (routine x 2)     Status: None   Collection Time: 06/07/2020  1:43 PM   Specimen: Left Antecubital; Blood  Result Value Ref Range Status   Specimen Description   Final    LEFT ANTECUBITAL BOTTLES DRAWN AEROBIC AND ANAEROBIC  Special Requests Blood Culture adequate volume  Final   Culture   Final    NO GROWTH 5 DAYS Performed at [facility], [address], [place], [place] [address]    Report Status 05/24/2020 FINAL  Final  MRSA PCR Screening     Status: None   Collection Time: 05/20/20  5:36 PM   Specimen: Nasopharyngeal  Result Value Ref Range Status   MRSA by PCR NEGATIVE NEGATIVE Final    Comment:        The GeneXpert MRSA Assay (FDA approved for NASAL specimens only), is one component of a comprehensive MRSA colonization surveillance program. It is not intended to diagnose MRSA infection nor to guide or monitor treatment for MRSA infections. Performed at [facility], [address] [place], [place] [address]   Body fluid culture     Status: None (Preliminary result)   Collection Time: 05/23/2020 11:46 AM   Specimen: PATH Cytology Misc. fluid; Body Fluid  Result Value Ref Range Status    Specimen Description FLUID PERICARDIAL EFFUSION  Final   Special Requests NONE  Final   Gram Stain   Final    RARE WBC PRESENT,BOTH PMN AND MONONUCLEAR NO ORGANISMS SEEN    Culture   Final    NO GROWTH 2 DAYS Performed at [facility], [address], [place], [place] [address]    Report Status PENDING  Incomplete    Radiology Reports CARDIAC CATHETERIZATION  Result Date: 06/07/2020  Hemodynamic findings consistent with mild pulmonary hypertension and tamponade.  CONCLUSIONS: Hemorrhagic pericardial effusion successfully drained with subxiphoid pericardiocentesis.  1000 cc of grossly bloody fluid was removed. Echo demonstration of near complete evacuation of the pericardial space. Hemodynamics post pericardiocentesis:  Right atrial mean 12 mmHg  Right ventricular pressure 47/11 mmHg  Pulmonary artery pressure 46/14 mmHg  Pulmonary vascular resistance 3.55 [place] units, WHO Group 2  Mean pulmonary capillary wedge pressure 16 mmHg  Cardiac output 4.5 L/min RECOMMENDATIONS:  Avoid anticoagulation  Repeat echo in a.m. to consider removing pericardial drain.  If long-term anticoagulation is required, will need pericardial window.   DG Chest Portable 1 View  Result Date: 05/31/2020 CLINICAL DATA:  Chest x-ray 05/16/2020, chest x-ray 07/03/2019 EXAM: PORTABLE CHEST 1 VIEW COMPARISON:  None. FINDINGS: The heart size and mediastinal contours are unchanged with an enlarged cardiac silhouette that may be partially due to a portable AP technique. Aortic arch and descending thoracic aortic calcifications. Vascular hilar fullness. Persistent bilateral perihilar and basilar patchy airspace opacities. Persistent trace left pleural effusion. Nonspecific blunting of the right costophrenic angle. No pneumothorax bilaterally. The visualized skeletal structures are unremarkable. Suggestion of a coronary stent. IMPRESSION: Similar appearing pulmonary edema with suggestion of a left small pleural  effusion. Electronically Signed   By: Iven Finn M.D.   On: 05/14/2020 13:16   DG Chest Port 1 View  Result Date: 05/16/2020 CLINICAL DATA:  Shortness of breath. EXAM: PORTABLE CHEST 1 VIEW COMPARISON:  July 03, 2019. FINDINGS: Stable cardiomegaly with central pulmonary vascular congestion. No pneumothorax is noted. Stable small left pleural effusion or pleural thickening is noted. Increased perihilar and basilar interstitial densities are noted concerning for edema. Bony thorax is unremarkable. IMPRESSION: Stable cardiomegaly with central pulmonary vascular congestion. Increased bilateral perihilar and basilar interstitial densities are noted concerning for edema. Aortic Atherosclerosis (ICD10-I70.0). Electronically Signed   By: Marijo Conception M.D.   On: 05/16/2020 12:34   ECHOCARDIOGRAM COMPLETE  Result Date: 05/20/2020    ECHOCARDIOGRAM REPORT  Patient Name:   MARISSA LOWREY Date of Exam: 05/20/2020 Medical Rec #:  [id number]      Height:       72.0 in Accession #:    [id number]     Weight:       248.7 lb Date of Birth:  25-Apr-1951      BSA:          2.337 m Patient Age:    70 years       BP:           112/88 mmHg Patient Gender: M              HR:           91 bpm. Exam Location:  [facility] Procedure: 2D Echo Indications:    CHF-Acute Diastolic 433.29 / J18.84  History:        Patient has prior history of Echocardiogram examinations, most                 recent 05/14/2019. Previous Myocardial Infarction, COPD; Risk                 Factors:Diabetes, Dyslipidemia, Hypertension and Current Smoker.                 Acute Respiratory Failure.  Sonographer:    Leavy Cella RDCS (AE) Referring Phys: [place]  1. Left ventricular ejection fraction, by estimation, is approximately 35%. The left ventricle has moderately decreased function. Left ventricular endocardial border not optimally defined to evaluate regional wall motion. There is mild left ventricular hypertrophy. Left  ventricular diastolic parameters are indeterminate.  2. Right ventricular systolic function is normal. The right ventricular size is normal. Tricuspid regurgitation signal is inadequate for assessing PA pressure.  3. Left atrial size was upper normal.  4. Moderate to large pericardial effusion. The pericardial effusion is circumferential. There is undulation of the right atrial free wall, intermittent diastolic compression of the right ventricular free wall (no collapse), and respiratory variation of mitral outflow consistent with hemodynamic significance, although not diagnostic of tamponade (BP and HR noted above).  5. The mitral valve is grossly normal. No evidence of mitral valve regurgitation.  6. The aortic valve is tricuspid. Aortic valve regurgitation is not visualized.  7. The inferior vena cava is dilated in size with <50% respiratory variability, suggesting right atrial pressure of 15 mmHg. FINDINGS  Left Ventricle: Left ventricular ejection fraction, by estimation, is 35%. The left ventricle has moderately decreased function. Left ventricular endocardial border not optimally defined to evaluate regional wall motion. The left ventricular internal cavity size was normal in size. There is mild left ventricular hypertrophy. Left ventricular diastolic parameters are indeterminate. Right Ventricle: The right ventricular size is normal. No increase in right ventricular wall thickness. Right ventricular systolic function is normal. Tricuspid regurgitation signal is inadequate for assessing PA pressure. Left Atrium: Left atrial size was upper normal. Right Atrium: Right atrial size was normal in size. Pericardium: A moderately sized pericardial effusion is present. The pericardial effusion is circumferential. Mitral Valve: The mitral valve is grossly normal. Mild mitral annular calcification. No evidence of mitral valve regurgitation. Tricuspid Valve: The tricuspid valve is grossly normal. Tricuspid valve  regurgitation is trivial. Aortic Valve: The aortic valve is tricuspid. There is mild to moderate aortic valve annular calcification. Aortic valve regurgitation is not visualized. Pulmonic Valve: The pulmonic valve was grossly normal. Pulmonic valve regurgitation is trivial. Aorta: The aortic root is normal in  size and structure. Venous: The inferior vena cava is dilated in size with less than 50% respiratory variability, suggesting right atrial pressure of 15 mmHg. IAS/Shunts: No atrial level shunt detected by color flow Doppler.  LEFT VENTRICLE PLAX 2D LVIDd:         4.86 cm  Diastology LVIDs:         4.21 cm  LV e' medial:    7.07 cm/s LV PW:         1.66 cm  LV E/e' medial:  13.6 LV IVS:        1.24 cm  LV e' lateral:   6.64 cm/s LVOT diam:     2.10 cm  LV E/e' lateral: 14.5 LVOT Area:     3.46 cm  RIGHT VENTRICLE RV S prime:     12.20 cm/s TAPSE (M-mode): 1.5 cm LEFT ATRIUM             Index       RIGHT ATRIUM           Index LA diam:        3.80 cm 1.63 cm/m  RA Area:     15.70 cm LA Vol (A2C):   83.5 ml 35.73 ml/m RA Volume:   39.50 ml  16.90 ml/m LA Vol (A4C):   53.3 ml 22.80 ml/m LA Biplane Vol: 67.3 ml 28.79 ml/m   AORTA Ao Root diam: 2.90 cm MITRAL VALVE MV Area (PHT): 4.46 cm    SHUNTS MV Decel Time: 170 msec    Systemic Diam: 2.10 cm MV E velocity: 96.40 cm/s MV A velocity: 38.10 cm/s MV E/A ratio:  2.53 Rozann Lesches MD Electronically signed by Rozann Lesches MD Signature Date/Time: 05/20/2020/4:45:40 PM    Final    ECHOCARDIOGRAM LIMITED  Result Date: 05/26/2020    ECHOCARDIOGRAM LIMITED REPORT   Patient Name:   AUGUSTEN LIPKIN Date of Exam: 05/26/2020 Medical Rec #:  [id number]      Height:       72.0 in Accession #:    [id number]     Weight:       263.0 lb Date of Birth:  07/12/1951      BSA:          2.394 m Patient Age:    89 years       BP:           102/50 mmHg Patient Gender: M              HR:           100 bpm. Exam Location:  Inpatient Procedure: 2D Echo, Cardiac Doppler, Color  Doppler and Intracardiac            Opacification Agent Indications:    Pericardial effusion 423.9 / I31.3  History:        Patient has prior history of Echocardiogram examinations, most                 recent 06/05/2020. CAD and Previous Myocardial Infarction, COPD,                 Arrythmias:non-specific ST changes, Signs/Symptoms:Dyspnea; Risk                 Factors:Hypertension, Diabetes, Dyslipidemia and Current Smoker.                 Pericardial effusion.  Sonographer:    Vickie Epley RDCS Referring Phys: [place]  1. Left ventricular  ejection fraction, by estimation, is 35 to 40%. The left ventricle has moderately decreased function. The left ventricle demonstrates regional wall motion abnormalities (see scoring diagram/findings for description). There is moderate akinesis of the left ventricular, basal-mid inferior segment.  2. The mitral valve is normal in structure. No evidence of mitral valve regurgitation. No evidence of mitral stenosis.  3. The aortic valve is normal in structure. Aortic valve regurgitation is not visualized. FINDINGS  Left Ventricle: Left ventricular ejection fraction, by estimation, is 35 to 40%. The left ventricle has moderately decreased function. The left ventricle demonstrates regional wall motion abnormalities. Moderate akinesis of the left ventricular, basal-mid inferior segment. Definity contrast agent was given IV to delineate the left ventricular endocardial borders. Mitral Valve: The mitral valve is normal in structure. No evidence of mitral valve stenosis. Aortic Valve: The aortic valve is normal in structure. Aortic valve regurgitation is not visualized. Mertie Moores MD Electronically signed by Mertie Moores MD Signature Date/Time: 05/26/2020/1:47:33 PM    Final    ECHOCARDIOGRAM LIMITED  Result Date: 05/21/2020    ECHOCARDIOGRAM LIMITED REPORT   Patient Name:   DWYNE HASEGAWA Date of Exam: 05/27/2020 Medical Rec #:  [id number]      Height:        72.0 in Accession #:    [id number]     Weight:       259.0 lb Date of Birth:  01-09-1951      BSA:          2.378 m Patient Age:    30 years       BP:           105/72 mmHg Patient Gender: M              HR:           99 bpm. Exam Location:  Inpatient Procedure: Limited Echo Indications:    Pericardial effusion 423.9 / I31.3  History:        Patient has prior history of Echocardiogram examinations, most                 recent 05/24/2020. CHF, COPD; Risk Factors:Hypertension and                 Dyslipidemia. GERD. Covid 19. Cardiomyopathy.  Sonographer:    Darlina Sicilian RDCS Referring Phys: [place]  Sonographer Comments: Echo performed during PERICARDIOCENTESIS IMPRESSIONS  1. Pre-pericardiocentesis images show moderate to large pericardial effusion more promient anteriorly. Patient underwent pericardiocentesis with post images revealing no residual pericardial effusion. FINDINGS  Pericardium: Pre-pericardiocentesis images show moderate to large pericardial effusion more promient anteriorly. Patient underwent pericardiocentesis with post images revealing no residual pericardial effusion. Fransico Him MD Electronically signed by Fransico Him MD Signature Date/Time: 05/31/2020/1:17:18 PM    Final    ECHOCARDIOGRAM LIMITED  Result Date: 05/22/2020    ECHOCARDIOGRAM LIMITED REPORT   Patient Name:   KODI STEIL Date of Exam: 06/08/2020 Medical Rec #:  [id number]      Height:       72.0 in Accession #:    [id number]     Weight:       259.0 lb Date of Birth:  1951/03/25      BSA:          2.378 m Patient Age:    9 years       BP:           104/72 mmHg Patient Gender: M  HR:           92 bpm. Exam Location:  Inpatient Procedure: Limited Echo and Limited Color Doppler Indications:    I31.3 Pericardial effusion  History:        Patient has prior history of Echocardiogram examinations, most                 recent 05/24/2020. Cardiomyopathy and CHF, Previous Myocardial                 Infarction and  CAD, COPD; Risk Factors:Hypertension, Diabetes                 and Dyslipidemia. COVID-19. GERD.  Sonographer:    Jonelle Sidle Dance Referring Phys: [place]  1. Left ventricular ejection fraction, by estimation, is 60 to 65%. The left ventricle has normal function. The left ventricle has no regional wall motion abnormalities.  2. Right ventricular systolic function is normal. The right ventricular size is normal. Tricuspid regurgitation signal is inadequate for assessing PA pressure.  3. Moderate to large pericardial effusion is present measuring 2.29cm at greatest diameter anterior to the RV free wall. The pericardial effusion is circumferential. There is minimal RA inversion but no RA collapse. The RV cavity is small but no obvious  RV diastolic collapse. No MV inflow velocities were performed.  4. The mitral valve is normal in structure. Mild mitral valve regurgitation. No evidence of mitral stenosis.  5. The aortic valve is tricuspid. Aortic valve regurgitation is not visualized. Mild aortic valve sclerosis is present, with no evidence of aortic valve stenosis.  6. The inferior vena cava is dilated in size with <50% respiratory variability, suggesting right atrial pressure of 15 mmHg.  7. No significant change in pericardial effusion from 05/24/2020 FINDINGS  Left Ventricle: Left ventricular ejection fraction, by estimation, is 60 to 65%. The left ventricle has normal function. The left ventricle has no regional wall motion abnormalities. The left ventricular internal cavity size was normal in size. There is  no left ventricular hypertrophy. Right Ventricle: The right ventricular size is normal. No increase in right ventricular wall thickness. Right ventricular systolic function is normal. Tricuspid regurgitation signal is inadequate for assessing PA pressure. Left Atrium: Left atrial size was normal in size. Right Atrium: Right atrial size was normal in size. Pericardium: A moderately sized  pericardial effusion is present. The pericardial effusion is circumferential. Mitral Valve: The mitral valve is normal in structure. Mild mitral valve regurgitation. No evidence of mitral valve stenosis. Tricuspid Valve: The tricuspid valve is normal in structure. Tricuspid valve regurgitation is not demonstrated. No evidence of tricuspid stenosis. Aortic Valve: The aortic valve is tricuspid. Aortic valve regurgitation is not visualized. Mild aortic valve sclerosis is present, with no evidence of aortic valve stenosis. Pulmonic Valve: The pulmonic valve was normal in structure. Pulmonic valve regurgitation is not visualized. No evidence of pulmonic stenosis. Aorta: The aortic root is normal in size and structure. Venous: The inferior vena cava is dilated in size with less than 50% respiratory variability, suggesting right atrial pressure of 15 mmHg. IAS/Shunts: No atrial level shunt detected by color flow Doppler. IVC IVC diam: 3.50 cm Fransico Him MD Electronically signed by Fransico Him MD Signature Date/Time: 06/04/2020/9:58:41 AM    Final    ECHOCARDIOGRAM LIMITED  Result Date: 05/24/2020    ECHOCARDIOGRAM LIMITED REPORT   Patient Name:   [name] BESKE Date of Exam: 05/24/2020 Medical Rec #:  [id number]  Height:       72.0 in Accession #:    [id number]     Weight:       255.3 lb Date of Birth:  08-Jul-1951      BSA:          2.363 m Patient Age:    75 years       BP:           118/99 mmHg Patient Gender: M              HR:           106 bpm. Exam Location:  [facility] Procedure: Limited Echo and Cardiac Doppler Indications:    Pericardial effusion 423.9 / I31.3  History:        Patient has prior history of Echocardiogram examinations, most                 recent 05/20/2020. Previous Myocardial Infarction, COPD; Risk                 Factors:Hypertension, Diabetes, Dyslipidemia and Current Smoker.                 Acute Respiratory Failure.  Sonographer:    Alvino Chapel RCS Referring Phys: [place]  1. Left ventricular ejection fraction, by estimation, is 35%. The left ventricle has moderately decreased function.  2. Overall oderate to large circumferential pericardial effusion.The effusion is large and most prominent anterior to the RV measuring 3.1 cm. Difficult to assess respiratory variation. Poor telemetry tracing, the inflow waves have irregular intervals, cannot distinguish if the respiratory variation is solely secondary to the irregular rhythm. There is RA diastolic indentation without clear collapse. There is indentation of the RV with some restriction of diastolic filling without frank collapse. Findings stable compared to 05/20/20 study.  3. The inferior vena cava is dilated in size with <50% respiratory variability, suggesting right atrial pressure of 15 mmHg. FINDINGS  Left Ventricle: Left ventricular ejection fraction, by estimation, is 35%. The left ventricle has moderately decreased function. Pericardium: Overall oderate to large circumferential pericardial effusion.The effusion is large and most prominent anterior to the RV measuring 3.1 cm. Difficult to assess respiratory variation. Poor telemetry tracing, the inflow waves have irregular intervals, cannot distinguish if the respiratory variation is solely secondary to the irregular rhythm. There is RA diastolic indentation without clear collapse. There is indentation of the RV with some restriction of diastolic filling without frank collapse. Findings stable compared to 05/20/20 study. Venous: The inferior vena cava is dilated in size with less than 50% respiratory variability, suggesting right atrial pressure of 15 mmHg. Carlyle Dolly MD Electronically signed by Carlyle Dolly MD Signature Date/Time: 05/24/2020/12:43:31 PM    Final      Time Spent in minutes  >30     Desiree Hane M.D on 05/27/2020 at 3:36 PM  To page go to [email or web address] - password [facility]

## 2020-05-28 ENCOUNTER — Inpatient Hospital Stay (HOSPITAL_COMMUNITY): Payer: Medicare HMO

## 2020-05-28 DIAGNOSIS — I5043 Acute on chronic combined systolic (congestive) and diastolic (congestive) heart failure: Secondary | ICD-10-CM

## 2020-05-28 DIAGNOSIS — R209 Unspecified disturbances of skin sensation: Secondary | ICD-10-CM

## 2020-05-28 DIAGNOSIS — I739 Peripheral vascular disease, unspecified: Secondary | ICD-10-CM

## 2020-05-28 DIAGNOSIS — I509 Heart failure, unspecified: Secondary | ICD-10-CM

## 2020-05-28 DIAGNOSIS — I313 Pericardial effusion (noninflammatory): Secondary | ICD-10-CM

## 2020-05-28 LAB — COMPREHENSIVE METABOLIC PANEL
ALT: 220 U/L — ABNORMAL HIGH (ref 0–44)
AST: 92 U/L — ABNORMAL HIGH (ref 15–41)
Albumin: 2.9 g/dL — ABNORMAL LOW (ref 3.5–5.0)
Alkaline Phosphatase: 180 U/L — ABNORMAL HIGH (ref 38–126)
Anion gap: 10 (ref 5–15)
BUN: 120 mg/dL — ABNORMAL HIGH (ref 8–23)
CO2: 29 mmol/L (ref 22–32)
Calcium: 8.9 mg/dL (ref 8.9–10.3)
Chloride: 95 mmol/L — ABNORMAL LOW (ref 98–111)
Creatinine, Ser: 1.98 mg/dL — ABNORMAL HIGH (ref 0.61–1.24)
GFR calc Af Amer: 39 mL/min — ABNORMAL LOW (ref >60)
GFR calc non Af Amer: 34 mL/min — ABNORMAL LOW (ref >60)
Glucose, Bld: 131 mg/dL — ABNORMAL HIGH (ref 70–99)
Potassium: 4 mmol/L (ref 3.5–5.1)
Sodium: 134 mmol/L — ABNORMAL LOW (ref 135–145)
Total Bilirubin: 0.8 mg/dL (ref 0.3–1.2)
Total Protein: 6.2 g/dL — ABNORMAL LOW (ref 6.5–8.1)

## 2020-05-28 LAB — CBC
HCT: 43.2 % (ref 39.0–52.0)
Hemoglobin: 14.1 g/dL (ref 13.0–17.0)
MCH: 30.9 pg (ref 26.0–34.0)
MCHC: 32.6 g/dL (ref 30.0–36.0)
MCV: 94.5 fL (ref 80.0–100.0)
Platelets: 264 10*3/uL (ref 150–400)
RBC: 4.57 MIL/uL (ref 4.22–5.81)
RDW: 14.4 % (ref 11.5–15.5)
WBC: 10.8 10*3/uL — ABNORMAL HIGH (ref 4.0–10.5)
nRBC: 0 % (ref 0.0–0.2)

## 2020-05-28 LAB — RENAL FUNCTION PANEL
Albumin: 2.8 g/dL — ABNORMAL LOW (ref 3.5–5.0)
Anion gap: 14 (ref 5–15)
BUN: 123 mg/dL — ABNORMAL HIGH (ref 8–23)
CO2: 25 mmol/L (ref 22–32)
Calcium: 8.6 mg/dL — ABNORMAL LOW (ref 8.9–10.3)
Chloride: 94 mmol/L — ABNORMAL LOW (ref 98–111)
Creatinine, Ser: 1.76 mg/dL — ABNORMAL HIGH (ref 0.61–1.24)
GFR calc Af Amer: 45 mL/min — ABNORMAL LOW (ref >60)
GFR calc non Af Amer: 39 mL/min — ABNORMAL LOW (ref >60)
Glucose, Bld: 236 mg/dL — ABNORMAL HIGH (ref 70–99)
Phosphorus: 4.2 mg/dL (ref 2.5–4.6)
Potassium: 4.2 mmol/L (ref 3.5–5.1)
Sodium: 133 mmol/L — ABNORMAL LOW (ref 135–145)

## 2020-05-28 LAB — BODY FLUID CULTURE: Culture: NO GROWTH

## 2020-05-28 LAB — GLUCOSE, CAPILLARY
Glucose-Capillary: 149 mg/dL — ABNORMAL HIGH (ref 70–99)
Glucose-Capillary: 278 mg/dL — ABNORMAL HIGH (ref 70–99)
Glucose-Capillary: 227 mg/dL — ABNORMAL HIGH (ref 70–99)
Glucose-Capillary: 197 mg/dL — ABNORMAL HIGH (ref 70–99)

## 2020-05-28 LAB — HEPARIN LEVEL (UNFRACTIONATED): Heparin Unfractionated: 1.09 IU/mL — ABNORMAL HIGH (ref 0.30–0.70)

## 2020-05-28 MED ORDER — PRISMASOL BGK 4/2.5 32-4-2.5 MEQ/L REPLACEMENT SOLN: Status: DC

## 2020-05-28 MED ORDER — IPRATROPIUM-ALBUTEROL 0.5-2.5 (3) MG/3ML IN SOLN
3.0000 mL | Freq: Four times a day (QID) | RESPIRATORY_TRACT | Status: DC
  Administered 2020-05-28 – 2020-05-29 (×5): 3 mL via RESPIRATORY_TRACT
  Filled 2020-05-28 (×5): qty 3

## 2020-05-28 MED ORDER — PRISMASOL BGK 4/2.5 32-4-2.5 MEQ/L IV SOLN: INTRAVENOUS | Status: DC

## 2020-05-28 MED ORDER — HEPARIN (PORCINE) 25000 UT/250ML-% IV SOLN
1200.0000 [IU]/h | INTRAVENOUS | Status: DC
  Administered 2020-05-28: 1200 [IU]/h via INTRAVENOUS
  Filled 2020-05-28: qty 250

## 2020-05-28 MED ORDER — METOLAZONE 5 MG PO TABS
10.0000 mg | ORAL_TABLET | Freq: Once | ORAL | Status: AC
  Administered 2020-05-28: 10 mg via ORAL
  Filled 2020-05-28: qty 2

## 2020-05-28 MED ORDER — HEPARIN SODIUM (PORCINE) 1000 UNIT/ML DIALYSIS
1000.0000 [IU] | INTRAMUSCULAR | Status: DC | PRN
  Administered 2020-05-30: 2800 [IU] via INTRAVENOUS_CENTRAL
  Filled 2020-05-28 (×3): qty 6

## 2020-05-28 NOTE — Progress Notes (Signed)
ABI's have been completed. Preliminary results can be found in CV Proc through chart review.   05/28/20 12:42 PM Robert [name] RVT

## 2020-05-28 NOTE — Progress Notes (Addendum)
[facility]  PROGRESS NOTE  SAMAR DASS IZT:[id number] DOB: 10/16/1950 DOA: 06/01/2020 PCP: Redmond School, MD Admit date - 05/25/2020   Admitting Physician Bonnielee Haff, MD  Outpatient Primary MD for the patient is Redmond School, MD  LOS - 9 Brief Narrative   BERNARR LONGSWORTH is a 69 y.o. year old male with medical history significant for COVID Pneu tihe hospitalization from 04/11/19-05/22/19 ( needing mechanical ventilation and course complicated by MRSA pneumonia)HTN, HLD, COPD, CHF, CKD stage IIIb who presented to [facility] on 9/8 with shortness of breath and weight gain over several weeks at that time patient was found to be hypoxic with SPO2 in the 80s on room air and was admitted for acute hypoxic respiratory failure presumed secondary to CHF exacerbation.  Hospital course complicated AKI on CKD in setting of significant volume overload and need for aggressive IV Lasix diuresis with poor urine output.  Patient was also found to have a new EF of 35% on TTE with large circumferential pericardial effusion noted with concern for evidence of developing tamponade. S/p pericardiocentesis on /14 with drain removed on 9/16. Hospital course complicated by persistent volume overload and high concern for need for HD despite aggressive IV diuresis   Subjective  Today still coughing. Nephrology recommended HD and he declined When I spoke with him and reiterated our concerns about his volume status and need for assistance with HD he stated he was amenable to re-discussing HD if possible. No chest pain. No fevers A & P    Pericardial tamponade, status post pericardiocentesis on 9/14.  Drain removed on 9/16 given minimal output  Developed while patient was off home Eliquis. No malignant cells on fluid studies -Appreciate cardiology recommendations -If fluid returns would need pericardial window  Acute on chronic systolic/diastolic CHF, persists.  EF 35-40% ( TTE, 05/26/20) with wall  motion abnormalities  Attributable to pericardial effusion. Net negative 10 L this admission, still with great output but very volume overloaded on exam with 3+ pitting edema of bilateral lower extremities, ascites, and persistently high O2 requirements.  -Cards recommmmends outpatient lexiscan for ischemic evaluation after recovery in a few weeks -Switched from bolus lasix to continuous lasix dripper nephrology, high concern needs HD -Continue to monitor output, daily weights -currently not a candidate for inotropes due to arrhythmia and ischemic substrate per cardiology --lopressor(home)  AKI on CKD stage IIIb and mild hyperkalemia, stable.  Baseline creatinine 1.5-1.7.  Creatinine currently 1.98 and BUN 120.  suspect could be prerenal in the setting of tamponade physiology due to above as well as CHF exacerbation, would expect improvement given recent pericardiocentesis and aggressive IV diuresis.  Potassium now wnl -avoid nephrotoxins, renal/carb modified diet --continue to closely monitor, nephrology recommending HD, patient initially declined on 9/17 but now agreeable to discuss  Acute hypoxic respiratory failure, likely multifactorial etiolgy include CHF exacerbation, pericardial tamponade, COPD, fluid overload with AKI on CKD. CXR shows either pulmonary edema vs opacities, I favor edema given volume overload on exam without any fever or leukocytosis. Also notes enlarging right pleural effusion. Requiring 12 L HF to maintain saturation of 90%(he states he uses 2-3 L as needed at home) -Continue IV diuretics as mentioned above -Continue Dulera and schedule duo-nebs --Consult PCCM given persistently high O2 requirementxs -Wean O2 as able with goal SPO2 of present 88% --incentive spirometry, flutter valve, mucinex  Elevated AST/ALT.  AST 115 ( 125 prior) ALT 230 (227prior) congestive hepatopathy related to CHF exacerbation -continue to monitor -Hold home  Lipitor -If does not continue to  improve can check hepatitis panel  Paroxysmal atrial fibrillation, rate controlled  CHA2DS2-VASc 5 -holding Eliquis given recent pericardiocentesis -Cardiology recommends starting heparin with no bolus dosing, and to avoid DOAC until we ensure hemopericardium is reaccumulating based off repeat TTE later -Start oral amiodarone per cardiology, avoid other agents that potentially drop blood pressure and worsening perfusion  COPD, mild exacerbation possible.  Has some wheezing on exam, likely biggest driver is vascular congestion in setting of CHF exacerbation, tamponade physiology addressed above.  Has normal respiratory effort on 12 L nasal cannula -Duo nebs as needed every 4 hours, Dulera -Flutter valve, incentive spirometer  Hypervolemic hyponatremia in the setting of CHF exacerbation/tamponade physiology, stable -Monitor BMP -Expect improvement with IV diuresis  CAD, stable.  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 -Holding home aspirin, Plavix -TTE to assess EF status post pericardiocentesis  Type 2 diabetes.  FBG in 149 this am,A1c 7.3  - Continue NovoLog 6 units 3 times daily with meals, Sliding scale Novolog 0-20 U TID with meals --Lantus 10 U --closely monitor CBG -Holding home Amaryl and home Toujeo 25 units until ensure fasting blood sugar able to sustain reinitiation of basal insulin  Peripheral neuropathy secondary to diabetes -continue gabapentin 300 mg 3 times daily  HTN, at goal -holding home losartan in setting of AKI -Continue home Lopressor  PAD.  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  Underwent ABI 17 which showed resting left ankle-brachial index concerning for moderate left lower extremity arterial disease -We will likely not obtain CT angio given inability to control contrast dose and treat blockage given new onset need for dialysis per  vascular -Vascular surgery was consulted will continue to follow peripherally please get him scheduled for angiography once medically stable, may need to occur as outpatient  Family Communication  : None  Code Status : Full  Disposition Plan  :  Patient is from home. Anticipated d/c date:  Greater than 3 days. Barriers to d/c or necessity for inpatient status:  Close monitoring  needs aggressive IV diuresis , still requiring high amount of O2 Consults  : CT surgery, cardiology, nephrology, PCCM  Procedures  : Pericardiocentesis, 9/14  DVT Prophylaxis  : Heparin Lab Results  Component Value Date   PLT 264 05/28/2020    Diet :  Diet Order            Diet renal/carb modified with fluid restriction Diet-HS Snack? Nothing; Fluid restriction: 1200 mL Fluid; Room service appropriate? Yes; Fluid consistency: Thin  Diet effective now                  Inpatient Medications Scheduled Meds: . amiodarone  200 mg Oral BID  . Chlorhexidine Gluconate Cloth  6 each Topical Daily  . dextromethorphan-guaiFENesin  1 tablet Oral BID  . fluticasone  1 spray Each Nare Daily  . folic acid  1 mg Oral Daily  . gabapentin  300 mg Oral TID  . heparin  5,000 Units Subcutaneous Q8H  . influenza vaccine adjuvanted  0.5 mL Intramuscular Tomorrow-1000  . insulin aspart  0-20 Units Subcutaneous TID WC  . insulin aspart  6 Units Subcutaneous TID WC  . insulin glargine  10 Units Subcutaneous Daily  . ipratropium-albuterol  3 mL Nebulization Q6H  . melatonin  3 mg Oral QHS  . metoprolol tartrate  25  mg Oral BID  . mometasone-formoterol  2 puff Inhalation BID  . primidone  100 mg Oral QHS  . sodium chloride flush  3 mL Intravenous Q12H  . sodium chloride flush  3 mL Intravenous Q12H   Continuous Infusions: . sodium chloride    . sodium chloride    . furosemide (LASIX) infusion 10 mg/hr (05/28/20 0900)   PRN Meds:.sodium chloride, sodium chloride, acetaminophen, Muscle Rub, ondansetron (ZOFRAN) IV,  oxyCODONE, sodium chloride flush, sodium chloride flush  Antibiotics  :   Anti-infectives (From admission, onward)   None       Objective   Vitals:   05/28/20 0600 05/28/20 0700 05/28/20 0800 05/28/20 0900  BP:  (!) 129/99 (!) 129/95 137/80  Pulse:  93 (!) 102 (!) 109  Resp:  (!) 28 (!) 21 (!) 29  Temp:      TempSrc:      SpO2:  91% 94% 92%  Weight: 111.6 kg     Height:        SpO2: 92 % O2 Flow Rate (L/min): 12 L/min  Wt Readings from Last 3 Encounters:  05/28/20 111.6 kg  05/16/20 111.5 kg  12/30/19 103.5 kg     Intake/Output Summary (Last 24 hours) at 05/28/2020 0956 Last data filed at 05/28/2020 0900 Gross per 24 hour  Intake 739.37 ml  Output 3170 ml  Net -2430.63 ml    Physical Exam:     Awake Alert, Oriented X 3, Normal affect No new F.N deficits,  [place].AT, Normal respiratory effort on 12 L HF [place], rhonchi heard, occasional wheeze,  mild conversational dyspnea RRR,No Gallops,Rubs or new Murmurs,  +ve B.Sounds, Abd Soft and distended, No tenderness, No rebound, guarding or rigidity. 3+ pitting edema of bilateral lower extremities up to mid thigh with weeping of left lower leg and open blister at distal left leg,    I have personally reviewed the following:   Data Reviewed:  CBC Recent Labs  Lab 05/23/20 0908 05/23/20 0908 05/15/2020 0910 05/18/2020 0910 05/27/2020 1214 06/03/2020 1215 05/26/20 0054 05/27/20 0548 05/28/20 0030  WBC 9.8  --  11.0*  --   --   --  14.6* 12.5* 10.8*  HGB 14.8   < > 14.3   < > 15.0 14.6 13.2 14.1 14.1  HCT 46.1   < > 44.0   < > 44.0 43.0 40.8 43.1 43.2  PLT 232  --  268  --   --   --  229 216 264  MCV 96.6  --  94.8  --   --   --  94.0 96.2 94.5  MCH 31.0  --  30.8  --   --   --  30.4 31.5 30.9  MCHC 32.1  --  32.5  --   --   --  32.4 32.7 32.6  RDW 14.6  --  14.6  --   --   --  14.5 14.6 14.4   < > = values in this interval not displayed.    Chemistries  Recent Labs  Lab 05/22/20 0840 05/22/20 0840  05/23/20 0908 05/23/20 0908 05/24/20 1005 05/24/20 1005 06/09/2020 0910 05/13/2020 0910 05/16/2020 1214 05/18/2020 1215 05/26/20 0054 05/27/20 0548 05/28/20 0030  NA 134*   < > 128*   < > 130*   < > 130*   < > 133* 134* 132* 133* 134*  K 4.4   < > 4.5   < > 4.7   < > 4.8   < >  5.0 5.0 5.4* 5.3* 4.0  CL 97*   < > 94*   < > 94*  --  95*  --   --   --  98 97* 95*  CO2 28   < > 25   < > 25  --  25  --   --   --  26 26 29   GLUCOSE 254*   < > 273*   < > 247*  --  154*  --   --   --  333* 229* 131*  BUN 82*   < > 90*   < > 105*  --  118*  --   --   --  114* 119* 120*  CREATININE 1.69*   < > 1.99*   < > 2.09*  --  2.18*  --   --   --  2.18* 2.20* 1.98*  CALCIUM 8.5*   < > 8.4*   < > 8.8*  --  8.6*  --   --   --  8.6* 8.6* 8.9  MG 2.5*  --  2.5*  --  2.6*  --   --   --   --   --  2.6* 2.6*  --   AST  --   --   --   --  125*  --   --   --   --   --  115* 130* 92*  ALT  --   --   --   --  227*  --   --   --   --   --  230* 226* 220*  ALKPHOS  --   --   --   --  134*  --   --   --   --   --  149* 154* 180*  BILITOT  --   --   --   --  0.5  --   --   --   --   --  0.6 0.4 0.8   < > = values in this interval not displayed.   ------------------------------------------------------------------------------------------------------------------ No results for input(s): CHOL, HDL, LDLCALC, TRIG, CHOLHDL, LDLDIRECT in the last 72 hours.  Lab Results  Component Value Date   HGBA1C 7.3 (H) 05/20/2020   ------------------------------------------------------------------------------------------------------------------ No results for input(s): TSH, T4TOTAL, T3FREE, THYROIDAB in the last 72 hours.  Invalid input(s): FREET3 ------------------------------------------------------------------------------------------------------------------ No results for input(s): VITAMINB12, FOLATE, FERRITIN, TIBC, IRON, RETICCTPCT in the last 72 hours.  Coagulation profile No results for input(s): INR, PROTIME in the last 168  hours.  No results for input(s): DDIMER in the last 72 hours.  Cardiac Enzymes No results for input(s): CKMB, TROPONINI, MYOGLOBIN in the last 168 hours.  Invalid input(s): CK ------------------------------------------------------------------------------------------------------------------    Component Value Date/Time   BNP 157.0 (H) 05/30/2020 1310    Micro Results Recent Results (from the past 240 hour(s))  SARS Coronavirus 2 by RT PCR (hospital order, performed in [facility] hospital lab) Nasopharyngeal Nasopharyngeal Swab     Status: None   Collection Time: 05/28/2020  1:20 PM   Specimen: Nasopharyngeal Swab  Result Value Ref Range Status   SARS Coronavirus 2 NEGATIVE NEGATIVE Final    Comment: (NOTE) SARS-CoV-2 target nucleic acids are NOT DETECTED.  The SARS-CoV-2 RNA is generally detectable in upper and lower respiratory specimens during the acute phase of infection. The lowest concentration of SARS-CoV-2 viral copies this assay can detect is 250 copies / mL. A negative result does not preclude SARS-CoV-2 infection and should not  be used as the sole basis for treatment or other patient management decisions.  A negative result may occur with improper specimen collection / handling, submission of specimen other than nasopharyngeal swab, presence of viral mutation(s) within the areas targeted by this assay, and inadequate number of viral copies (<250 copies / mL). A negative result must be combined with clinical observations, patient history, and epidemiological information.  Fact Sheet for Patients:   StrictlyIdeas.no  Fact Sheet for Healthcare Providers: BankingDealers.co.za  This test is not yet approved or  cleared by the [place] FDA and has been authorized for detection and/or diagnosis of SARS-CoV-2 by FDA under an Emergency Use Authorization (EUA).  This EUA will remain in effect (meaning this test can be used)  for the duration of the COVID-19 declaration under Section 564(b)(1) of the Act, 21 U.S.C. section 360bbb-3(b)(1), unless the authorization is terminated or revoked sooner.  Performed at [facility], [address] [place], [place] [address]   Blood culture (routine x 2)     Status: None   Collection Time: 06/08/2020  1:42 PM   Specimen: BLOOD LEFT HAND  Result Value Ref Range Status   Specimen Description   Final    BLOOD LEFT HAND BOTTLES DRAWN AEROBIC AND ANAEROBIC   Special Requests Blood Culture adequate volume  Final   Culture   Final    NO GROWTH 5 DAYS Performed at [facility], [address] [place], [place] [address]    Report Status 05/24/2020 FINAL  Final  Blood culture (routine x 2)     Status: None   Collection Time: 06/07/2020  1:43 PM   Specimen: Left Antecubital; Blood  Result Value Ref Range Status   Specimen Description   Final    LEFT ANTECUBITAL BOTTLES DRAWN AEROBIC AND ANAEROBIC   Special Requests Blood Culture adequate volume  Final   Culture   Final    NO GROWTH 5 DAYS Performed at [facility], [address], [place], [place] [address]    Report Status 05/24/2020 FINAL  Final  MRSA PCR Screening     Status: None   Collection Time: 05/20/20  5:36 PM   Specimen: Nasopharyngeal  Result Value Ref Range Status   MRSA by PCR NEGATIVE NEGATIVE Final    Comment:        The GeneXpert MRSA Assay (FDA approved for NASAL specimens only), is one component of a comprehensive MRSA colonization surveillance program. It is not intended to diagnose MRSA infection nor to guide or monitor treatment for MRSA infections. Performed at [facility], [address] [place], [place] [address]   Body fluid culture     Status: None   Collection Time: 05/28/2020 11:46 AM   Specimen: PATH Cytology Misc. fluid; Body Fluid  Result Value Ref Range Status   Specimen Description FLUID PERICARDIAL EFFUSION  Final   Special Requests NONE  Final   Gram Stain   Final    RARE WBC  PRESENT,BOTH PMN AND MONONUCLEAR NO ORGANISMS SEEN    Culture   Final    NO GROWTH 3 DAYS Performed at [facility], [address] [place], [place] [address]    Report Status 05/28/2020 FINAL  Final    Radiology Reports CARDIAC CATHETERIZATION  Result Date: 05/26/2020  Hemodynamic findings consistent with mild pulmonary hypertension and tamponade.  CONCLUSIONS: Hemorrhagic pericardial effusion successfully drained with subxiphoid pericardiocentesis.  1000 cc of grossly bloody fluid was removed. Echo demonstration of near complete evacuation of the  pericardial space. Hemodynamics post pericardiocentesis:  Right atrial mean 12 mmHg  Right ventricular pressure 47/11 mmHg  Pulmonary artery pressure 46/14 mmHg  Pulmonary vascular resistance 3.55 [place] units, WHO Group 2  Mean pulmonary capillary wedge pressure 16 mmHg  Cardiac output 4.5 L/min RECOMMENDATIONS:  Avoid anticoagulation  Repeat echo in a.m. to consider removing pericardial drain.  If long-term anticoagulation is required, will need pericardial window.   DG CHEST PORT 1 VIEW  Result Date: 05/28/2020 CLINICAL DATA:  Acute respiratory failure with hypoxia. Follow-up exam. EXAM: PORTABLE CHEST 1 VIEW COMPARISON:  05/29/2020 and 05/16/2020. FINDINGS: There is increased opacity at the right lung base compared to the previous exam, with opacity no obscuring the hemidiaphragm. Lungs demonstrate persistent bilateral interstitial thickening with hazy airspace type opacity in the perihilar to lower lungs. No pneumothorax. IMPRESSION: 1. Interval worsening of lung aeration on the right with increased right lung base opacity. This is most likely due to an enlarged pleural effusion. 2. Persistent interstitial and hazy airspace opacities, which may reflect multifocal infection or pulmonary edema. Additional lung base opacity on the left is likely a combination of a small effusion and atelectasis, also stable. Electronically Signed   By:  Lajean Manes M.D.   On: 05/28/2020 08:39   DG Chest Portable 1 View  Result Date: 05/28/2020 CLINICAL DATA:  Chest x-ray 05/16/2020, chest x-ray 07/03/2019 EXAM: PORTABLE CHEST 1 VIEW COMPARISON:  None. FINDINGS: The heart size and mediastinal contours are unchanged with an enlarged cardiac silhouette that may be partially due to a portable AP technique. Aortic arch and descending thoracic aortic calcifications. Vascular hilar fullness. Persistent bilateral perihilar and basilar patchy airspace opacities. Persistent trace left pleural effusion. Nonspecific blunting of the right costophrenic angle. No pneumothorax bilaterally. The visualized skeletal structures are unremarkable. Suggestion of a coronary stent. IMPRESSION: Similar appearing pulmonary edema with suggestion of a left small pleural effusion. Electronically Signed   By: Iven Finn M.D.   On: 05/13/2020 13:16   DG Chest Port 1 View  Result Date: 05/16/2020 CLINICAL DATA:  Shortness of breath. EXAM: PORTABLE CHEST 1 VIEW COMPARISON:  July 03, 2019. FINDINGS: Stable cardiomegaly with central pulmonary vascular congestion. No pneumothorax is noted. Stable small left pleural effusion or pleural thickening is noted. Increased perihilar and basilar interstitial densities are noted concerning for edema. Bony thorax is unremarkable. IMPRESSION: Stable cardiomegaly with central pulmonary vascular congestion. Increased bilateral perihilar and basilar interstitial densities are noted concerning for edema. Aortic Atherosclerosis (ICD10-I70.0). Electronically Signed   By: Marijo Conception M.D.   On: 05/16/2020 12:34   ECHOCARDIOGRAM COMPLETE  Result Date: 05/20/2020    ECHOCARDIOGRAM REPORT   Patient Name:   RICH PAPROCKI Date of Exam: 05/20/2020 Medical Rec #:  [id number]      Height:       72.0 in Accession #:    [id number]     Weight:       248.7 lb Date of Birth:  May 25, 1951      BSA:          2.337 m Patient Age:    68 years       BP:            112/88 mmHg Patient Gender: M              HR:           91 bpm. Exam Location:  [facility] Procedure: 2D Echo Indications:    CHF-Acute Diastolic 062.37 / S28.31  History:        Patient has prior history of Echocardiogram examinations, most                 recent 05/14/2019. Previous Myocardial Infarction, COPD; Risk                 Factors:Diabetes, Dyslipidemia, Hypertension and Current Smoker.                 Acute Respiratory Failure.  Sonographer:    Leavy Cella RDCS (AE) Referring Phys: [place]  1. Left ventricular ejection fraction, by estimation, is approximately 35%. The left ventricle has moderately decreased function. Left ventricular endocardial border not optimally defined to evaluate regional wall motion. There is mild left ventricular hypertrophy. Left ventricular diastolic parameters are indeterminate.  2. Right ventricular systolic function is normal. The right ventricular size is normal. Tricuspid regurgitation signal is inadequate for assessing PA pressure.  3. Left atrial size was upper normal.  4. Moderate to large pericardial effusion. The pericardial effusion is circumferential. There is undulation of the right atrial free wall, intermittent diastolic compression of the right ventricular free wall (no collapse), and respiratory variation of mitral outflow consistent with hemodynamic significance, although not diagnostic of tamponade (BP and HR noted above).  5. The mitral valve is grossly normal. No evidence of mitral valve regurgitation.  6. The aortic valve is tricuspid. Aortic valve regurgitation is not visualized.  7. The inferior vena cava is dilated in size with <50% respiratory variability, suggesting right atrial pressure of 15 mmHg. FINDINGS  Left Ventricle: Left ventricular ejection fraction, by estimation, is 35%. The left ventricle has moderately decreased function. Left ventricular endocardial border not optimally defined to evaluate regional wall  motion. The left ventricular internal cavity size was normal in size. There is mild left ventricular hypertrophy. Left ventricular diastolic parameters are indeterminate. Right Ventricle: The right ventricular size is normal. No increase in right ventricular wall thickness. Right ventricular systolic function is normal. Tricuspid regurgitation signal is inadequate for assessing PA pressure. Left Atrium: Left atrial size was upper normal. Right Atrium: Right atrial size was normal in size. Pericardium: A moderately sized pericardial effusion is present. The pericardial effusion is circumferential. Mitral Valve: The mitral valve is grossly normal. Mild mitral annular calcification. No evidence of mitral valve regurgitation. Tricuspid Valve: The tricuspid valve is grossly normal. Tricuspid valve regurgitation is trivial. Aortic Valve: The aortic valve is tricuspid. There is mild to moderate aortic valve annular calcification. Aortic valve regurgitation is not visualized. Pulmonic Valve: The pulmonic valve was grossly normal. Pulmonic valve regurgitation is trivial. Aorta: The aortic root is normal in size and structure. Venous: The inferior vena cava is dilated in size with less than 50% respiratory variability, suggesting right atrial pressure of 15 mmHg. IAS/Shunts: No atrial level shunt detected by color flow Doppler.  LEFT VENTRICLE PLAX 2D LVIDd:         4.86 cm  Diastology LVIDs:         4.21 cm  LV e' medial:    7.07 cm/s LV PW:         1.66 cm  LV E/e' medial:  13.6 LV IVS:        1.24 cm  LV e' lateral:   6.64 cm/s LVOT diam:     2.10 cm  LV E/e' lateral: 14.5 LVOT Area:     3.46 cm  RIGHT VENTRICLE RV S prime:     12.20 cm/s TAPSE (M-mode):  1.5 cm LEFT ATRIUM             Index       RIGHT ATRIUM           Index LA diam:        3.80 cm 1.63 cm/m  RA Area:     15.70 cm LA Vol (A2C):   83.5 ml 35.73 ml/m RA Volume:   39.50 ml  16.90 ml/m LA Vol (A4C):   53.3 ml 22.80 ml/m LA Biplane Vol: 67.3 ml 28.79  ml/m   AORTA Ao Root diam: 2.90 cm MITRAL VALVE MV Area (PHT): 4.46 cm    SHUNTS MV Decel Time: 170 msec    Systemic Diam: 2.10 cm MV E velocity: 96.40 cm/s MV A velocity: 38.10 cm/s MV E/A ratio:  2.53 Rozann Lesches MD Electronically signed by Rozann Lesches MD Signature Date/Time: 05/20/2020/4:45:40 PM    Final    ECHOCARDIOGRAM LIMITED  Result Date: 05/26/2020    ECHOCARDIOGRAM LIMITED REPORT   Patient Name:   KAL CHAIT Date of Exam: 05/26/2020 Medical Rec #:  [id number]      Height:       72.0 in Accession #:    [id number]     Weight:       263.0 lb Date of Birth:  [birth year]      BSA:          2.394 m Patient Age:    3 years       BP:           102/50 mmHg Patient Gender: M              HR:           100 bpm. Exam Location:  Inpatient Procedure: 2D Echo, Cardiac Doppler, Color Doppler and Intracardiac            Opacification Agent Indications:    Pericardial effusion 423.9 / I31.3  History:        Patient has prior history of Echocardiogram examinations, most                 recent 05/24/2020. CAD and Previous Myocardial Infarction, COPD,                 Arrythmias:non-specific ST changes, Signs/Symptoms:Dyspnea; Risk                 Factors:Hypertension, Diabetes, Dyslipidemia and Current Smoker.                 Pericardial effusion.  Sonographer:    Vickie Epley RDCS Referring Phys: [place]  1. Left ventricular ejection fraction, by estimation, is 35 to 40%. The left ventricle has moderately decreased function. The left ventricle demonstrates regional wall motion abnormalities (see scoring diagram/findings for description). There is moderate akinesis of the left ventricular, basal-mid inferior segment.  2. The mitral valve is normal in structure. No evidence of mitral valve regurgitation. No evidence of mitral stenosis.  3. The aortic valve is normal in structure. Aortic valve regurgitation is not visualized. FINDINGS  Left Ventricle: Left ventricular ejection fraction, by  estimation, is 35 to 40%. The left ventricle has moderately decreased function. The left ventricle demonstrates regional wall motion abnormalities. Moderate akinesis of the left ventricular, basal-mid inferior segment. Definity contrast agent was given IV to delineate the left ventricular endocardial borders. Mitral Valve: The mitral valve is normal in structure. No evidence of mitral valve stenosis. Aortic Valve: The aortic  valve is normal in structure. Aortic valve regurgitation is not visualized. Mertie Moores MD Electronically signed by Mertie Moores MD Signature Date/Time: 05/26/2020/1:47:33 PM    Final    ECHOCARDIOGRAM LIMITED  Result Date: 06/02/2020    ECHOCARDIOGRAM LIMITED REPORT   Patient Name:   AHMED INNISS Date of Exam: 06/10/2020 Medical Rec #:  [id number]      Height:       72.0 in Accession #:    [id number]     Weight:       259.0 lb Date of Birth:  [birth year]      BSA:          2.378 m Patient Age:    82 years       BP:           105/72 mmHg Patient Gender: M              HR:           99 bpm. Exam Location:  Inpatient Procedure: Limited Echo Indications:    Pericardial effusion 423.9 / I31.3  History:        Patient has prior history of Echocardiogram examinations, most                 recent 05/20/2020. CHF, COPD; Risk Factors:Hypertension and                 Dyslipidemia. GERD. Covid 19. Cardiomyopathy.  Sonographer:    Darlina Sicilian RDCS Referring Phys: [place]  Sonographer Comments: Echo performed during PERICARDIOCENTESIS IMPRESSIONS  1. Pre-pericardiocentesis images show moderate to large pericardial effusion more promient anteriorly. Patient underwent pericardiocentesis with post images revealing no residual pericardial effusion. FINDINGS  Pericardium: Pre-pericardiocentesis images show moderate to large pericardial effusion more promient anteriorly. Patient underwent pericardiocentesis with post images revealing no residual pericardial effusion. Fransico Him MD  Electronically signed by Fransico Him MD Signature Date/Time: 05/31/2020/1:17:18 PM    Final    ECHOCARDIOGRAM LIMITED  Result Date: 06/10/2020    ECHOCARDIOGRAM LIMITED REPORT   Patient Name:   TAGE FEGGINS Date of Exam: 05/28/2020 Medical Rec #:  [id number]      Height:       72.0 in Accession #:    [id number]     Weight:       259.0 lb Date of Birth:  1951/08/03      BSA:          2.378 m Patient Age:    26 years       BP:           104/72 mmHg Patient Gender: M              HR:           92 bpm. Exam Location:  Inpatient Procedure: Limited Echo and Limited Color Doppler Indications:    I31.3 Pericardial effusion  History:        Patient has prior history of Echocardiogram examinations, most                 recent 05/24/2020. Cardiomyopathy and CHF, Previous Myocardial                 Infarction and CAD, COPD; Risk Factors:Hypertension, Diabetes                 and Dyslipidemia. COVID-19. GERD.  Sonographer:    Jonelle Sidle Dance Referring Phys: [place]  1. Left ventricular ejection  fraction, by estimation, is 60 to 65%. The left ventricle has normal function. The left ventricle has no regional wall motion abnormalities.  2. Right ventricular systolic function is normal. The right ventricular size is normal. Tricuspid regurgitation signal is inadequate for assessing PA pressure.  3. Moderate to large pericardial effusion is present measuring 2.29cm at greatest diameter anterior to the RV free wall. The pericardial effusion is circumferential. There is minimal RA inversion but no RA collapse. The RV cavity is small but no obvious  RV diastolic collapse. No MV inflow velocities were performed.  4. The mitral valve is normal in structure. Mild mitral valve regurgitation. No evidence of mitral stenosis.  5. The aortic valve is tricuspid. Aortic valve regurgitation is not visualized. Mild aortic valve sclerosis is present, with no evidence of aortic valve stenosis.  6. The inferior vena cava is  dilated in size with <50% respiratory variability, suggesting right atrial pressure of 15 mmHg.  7. No significant change in pericardial effusion from 05/24/2020 FINDINGS  Left Ventricle: Left ventricular ejection fraction, by estimation, is 60 to 65%. The left ventricle has normal function. The left ventricle has no regional wall motion abnormalities. The left ventricular internal cavity size was normal in size. There is  no left ventricular hypertrophy. Right Ventricle: The right ventricular size is normal. No increase in right ventricular wall thickness. Right ventricular systolic function is normal. Tricuspid regurgitation signal is inadequate for assessing PA pressure. Left Atrium: Left atrial size was normal in size. Right Atrium: Right atrial size was normal in size. Pericardium: A moderately sized pericardial effusion is present. The pericardial effusion is circumferential. Mitral Valve: The mitral valve is normal in structure. Mild mitral valve regurgitation. No evidence of mitral valve stenosis. Tricuspid Valve: The tricuspid valve is normal in structure. Tricuspid valve regurgitation is not demonstrated. No evidence of tricuspid stenosis. Aortic Valve: The aortic valve is tricuspid. Aortic valve regurgitation is not visualized. Mild aortic valve sclerosis is present, with no evidence of aortic valve stenosis. Pulmonic Valve: The pulmonic valve was normal in structure. Pulmonic valve regurgitation is not visualized. No evidence of pulmonic stenosis. Aorta: The aortic root is normal in size and structure. Venous: The inferior vena cava is dilated in size with less than 50% respiratory variability, suggesting right atrial pressure of 15 mmHg. IAS/Shunts: No atrial level shunt detected by color flow Doppler. IVC IVC diam: 3.50 cm Fransico Him MD Electronically signed by Fransico Him MD Signature Date/Time: 05/28/2020/9:58:41 AM    Final    ECHOCARDIOGRAM LIMITED  Result Date: 05/24/2020    ECHOCARDIOGRAM  LIMITED REPORT   Patient Name:   AMARA MANALANG Date of Exam: 05/24/2020 Medical Rec #:  [id number]      Height:       72.0 in Accession #:    [id number]     Weight:       255.3 lb Date of Birth:  1950/10/09      BSA:          2.363 m Patient Age:    63 years       BP:           118/99 mmHg Patient Gender: M              HR:           106 bpm. Exam Location:  [facility] Procedure: Limited Echo and Cardiac Doppler Indications:    Pericardial effusion 423.9 / I31.3  History:  Patient has prior history of Echocardiogram examinations, most                 recent 05/20/2020. Previous Myocardial Infarction, COPD; Risk                 Factors:Hypertension, Diabetes, Dyslipidemia and Current Smoker.                 Acute Respiratory Failure.  Sonographer:    Alvino Chapel RCS Referring Phys: [place]  1. Left ventricular ejection fraction, by estimation, is 35%. The left ventricle has moderately decreased function.  2. Overall oderate to large circumferential pericardial effusion.The effusion is large and most prominent anterior to the RV measuring 3.1 cm. Difficult to assess respiratory variation. Poor telemetry tracing, the inflow waves have irregular intervals, cannot distinguish if the respiratory variation is solely secondary to the irregular rhythm. There is RA diastolic indentation without clear collapse. There is indentation of the RV with some restriction of diastolic filling without frank collapse. Findings stable compared to 05/20/20 study.  3. The inferior vena cava is dilated in size with <50% respiratory variability, suggesting right atrial pressure of 15 mmHg. FINDINGS  Left Ventricle: Left ventricular ejection fraction, by estimation, is 35%. The left ventricle has moderately decreased function. Pericardium: Overall oderate to large circumferential pericardial effusion.The effusion is large and most prominent anterior to the RV measuring 3.1 cm. Difficult to assess respiratory  variation. Poor telemetry tracing, the inflow waves have irregular intervals, cannot distinguish if the respiratory variation is solely secondary to the irregular rhythm. There is RA diastolic indentation without clear collapse. There is indentation of the RV with some restriction of diastolic filling without frank collapse. Findings stable compared to 05/20/20 study. Venous: The inferior vena cava is dilated in size with less than 50% respiratory variability, suggesting right atrial pressure of 15 mmHg. Carlyle Dolly MD Electronically signed by Carlyle Dolly MD Signature Date/Time: 05/24/2020/12:43:31 PM    Final      Time Spent in minutes  >30     Desiree Hane M.D on 05/28/2020 at 9:56 AM  To page go to [email or web address] - password [facility]

## 2020-05-28 NOTE — Procedures (Signed)
Central Venous Catheter Insertion Procedure Note  KAYLEM GIDNEY  [id number]  06-15-1951  Date:05/28/20  Time:2:25 PM   Provider Performing:Pete Johnette Abraham Kary Kos   Procedure: Insertion of Non-tunneled Central Venous Catheter(36556)with US guidance (97847)    Indication(s) Hemodialysis  Consent Risks of the procedure as well as the alternatives and risks of each were explained to the patient and/or caregiver.  Consent for the procedure was obtained and is signed in the bedside chart  Anesthesia Topical only with 1% lidocaine      Timeout Verified patient identification, verified procedure, site/side was marked, verified correct patient position, special equipment/implants available, medications/allergies/relevant history reviewed, required imaging and test results available.  Sterile Technique Maximal sterile technique including full sterile barrier drape, hand hygiene, sterile gown, sterile gloves, mask, hair covering, sterile ultrasound probe cover (if used).  Procedure Description Area of catheter insertion was cleaned with chlorhexidine and draped in sterile fashion.   With real-time ultrasound guidance a HD catheter was placed into the left internal jugular vein.  Nonpulsatile blood flow and easy flushing noted in all ports.  The catheter was sutured in place and sterile dressing applied.  Complications/Tolerance None; patient tolerated the procedure well. Chest X-ray is ordered to verify placement for internal jugular or subclavian cannulation.  Chest x-ray is not ordered for femoral cannulation.  EBL Minimal  Specimen(s) None Erick Colace ACNP-BC [place] Pager # [phone] OR # [phone] if no answer

## 2020-05-28 NOTE — Progress Notes (Addendum)
[facility] Progress Note  Name: Robert [name] MRN: [id number] DOB: Oct 09, 1950   Subjective:  He had 10 mg metolazone yesterday AM and yesterday afternoon; yesterday afternoon was transitioned to lasix gtt at 10 mg/hr.  He had 2.5 liters UOP over 9/16.   He's been on 12 liters oxygen and isn't on any at home.   He would want dialysis if it were to become indicated and we discussed the risks, benefits, and indications.  I have offered HD today 9/17 and he thins breathing is overall "ok" and doesn't want to try HD yet.   Review of systems: Reports shortness of breath - about the same as yesterday but states he is "doing fine" Denies cp  Denies n/v   Intake/Output Summary (Last 24 hours) at 05/28/2020 0621 Last data filed at 05/28/2020 0500 Gross per 24 hour  Intake 300 ml  Output 2870 ml  Net -2570 ml    Vitals:  Vitals:   05/28/20 0300 05/28/20 0400 05/28/20 0430 05/28/20 0500  BP: 99/74 114/78  115/82  Pulse: 88 90 86 81  Resp: 18 15 19 18   Temp:  (!) 97.5 F (36.4 C)    TempSrc:  Oral    SpO2: 94% 93% 93% 93%  Weight:      Height:         Physical Exam:  General adult male in bed chronically ill appearing   HEENT normocephalic atraumatic extraocular movements intact sclera anicteric Neck supple trachea midline Lungs clearer few basilar crackles; inc work of breathing with exertion but able to speak and no distress at rest Heart S1S2 tachy Abdomen soft nontender obese habitus Extremities 3+ edema bilaterally  Psych normal mood and affect Neuro alert and oriented x3 provides hx and follows commands   Medications reviewed  Labs:  BMP Latest Ref Rng & Units 05/28/2020 05/27/2020 05/26/2020  Glucose 70 - 99 mg/dL 131(H) 229(H) 333(H)  BUN 8 - 23 mg/dL 120(H) 119(H) 114(H)  Creatinine 0.61 - 1.24 mg/dL 1.98(H) 2.20(H) 2.18(H)  BUN/Creat Ratio 10 - 24 - - -  Sodium 135 - 145 mmol/L 134(L) 133(L) 132(L)  Potassium 3.5 - 5.1 mmol/L 4.0 5.3(H) 5.4(H)   Chloride 98 - 111 mmol/L 95(L) 97(L) 98  CO2 22 - 32 mmol/L 29 26 26   Calcium 8.9 - 10.3 mg/dL 8.9 8.6(L) 8.6(L)     Assessment/Plan:   1. AKI on CKD3b  1. AKI 2/2 acute CHF and pericardial effusion.  Improved UOP after pericardiocentesis.  He would want dialysis if it were to become indicated 2. Lasix gtt 10 mg/hr  3. Metolazone 10 mg PO once now  4. Offered HD today and he declined - he is approaching the need for dialysis and we discussed would need to reassess tomorrow and would rec starting if no improvement in urine output  2. AoC sCHF exacerbation - appreciate cardiology.  Diuretics as above   3. Pericardial effusion s/p paracardiocentesis   4. Acute hypoxic resp failure - optimizing diuretics as above; copd per primary  5. Hyperkalemia - diuretics as above.  Changed to renal diet  S/p lokelma on 9/15 & 9/16  6. Hyponatremia, hypervolemic - improving   7. DM2 per primary team   8. COPD - decreased resp reserve; per primary  9. AFib, rate control, anticoagulation per primary team   Claudia Desanctis, MD 05/28/2020  6:35 AM  Patient remains overloaded and is now amenable to starting dialysis today.  Will start CRRT. 4K fluids.  UF  goal keep even x 2 hours then net neg 50 to 100 an hour as tolerated to start.  Pulmonary was consulted and they are placing a nontunneled catheter.  Will discontinue lasix gtt.   Claudia Desanctis, MD 05/28/2020  12:14 PM

## 2020-05-28 NOTE — Consult Note (Signed)
NAME:  Robert [name], MRN:  [id number], DOB:  1950-09-23, LOS: 79 ADMISSION DATE:  05/31/2020, CONSULTATION DATE:  05/28/2020 REFERRING MD:  Dr. Teryl Lucy, CHIEF COMPLAINT:  Hypoxic    Brief History   69 year old gentleman past medical history of hypertension, hyperlipidemia, COPD, congestive heart failure, CKD stage IIIb.  Patient also has new systolic and diastolic cardiomyopathy, acute on chronic with ejection fraction 35 to 40%.  Patient with increasing oxygen requirements.  New enlarging pleural effusion.  Decision made for need of CVVHD after discussion with nephrology.  Pulmonary critical care consulted.  History of present illness   This is a 69 year old gentleman past medical history of hypertension hyperlipidemia, COPD, congestive heart failure, CKD 3B.  New systolic diastolic cardiomyopathy with an EF of 35 to 40%.  Patient has been on maximal diuretics with tapering urine output.  He had declined HD over the past couple of days hoping that the diuretics would continue to work.  Unfortunately had increasing oxygen requirements.  Chest x-ray completed today with a large right-sided pleural effusion as well as fluid retention in his bilateral lower extremities and pulmonary vascular congestion.  Decision was made for consultation pulmonary critical care due to increasing oxygen requirements and consideration for CVVHD.  I discussed case with nephrology.  They are in agreement to initiate CVVHD at this time.  Past Medical History   Past Medical History:  Diagnosis Date  . Anxiety   . CHF (congestive heart failure) ([place])   . Collagen vascular disease ([place])   . COPD (chronic obstructive pulmonary disease) ([place])   . Coronary artery disease   . COVID-19   . Full dentures   . GERD (gastroesophageal reflux disease)    Rolaids as needed  . High cholesterol   . History of MI (myocardial infarction)   . Hypertension    med. dosage increased 04/2016; has been on BP med. x 10 yrs.  . Incarcerated  umbilical hernia [id number]/2633  . Inguinal hernia 05/2016   bilateral   . Insulin dependent diabetes mellitus   . Ischemic cardiomyopathy    a. 03/2015 EF 40-45% by LV gram.  . Left leg cellulitis 10/08/2016  . Rheumatoid arthritis(714.0)      Significant Hospital Events   ICU admission  Consults:  Pulmonary critical care Cardiology Nephrology Hospitalist  Procedures:  05/28/2020: HD catheter  Significant Diagnostic Tests:    Micro Data:    Antimicrobials:     Interim history/subjective:  Per HPI above.  Patient does have complaints of increasing shortness of breath.  Objective   Blood pressure 123/81, pulse 88, temperature 97.6 F (36.4 C), temperature source Oral, resp. rate (!) 25, height 6' (1.829 m), weight 111.6 kg, SpO2 90 %.        Intake/Output Summary (Last 24 hours) at 05/28/2020 1151 Last data filed at 05/28/2020 1120 Gross per 24 hour  Intake 679.37 ml  Output 3320 ml  Net -2640.63 ml   Filed Weights   05/26/20 0500 05/27/20 0500 05/28/20 0600  Weight: 119.3 kg 112.7 kg 111.6 kg    Examination: General: Elderly male sitting up in bed tripod position short of breath. HENT: NCAT sclera clear tracking appropriately Lungs: Diminished breath sounds in the bilateral bases worse on the right than the left, no crackles no wheeze Cardiovascular: regular rate rhythm S1-S2 Abdomen: Soft nontender, mildly distended Extremities: Bilateral lower extremity dependent edema Neuro: Alert following commands no deficit GU: Deferred  Pressure Injury 07/04/19 Buttocks Right;Upper Stage II -  Partial thickness  loss of dermis presenting as a shallow open ulcer with a red, pink wound bed without slough. (Active)  07/04/19 1429  Location: Buttocks  Location Orientation: Right;Upper  Staging: Stage II -  Partial thickness loss of dermis presenting as a shallow open ulcer with a red, pink wound bed without slough.  Wound Description (Comments):   Present on Admission: Yes      Pressure Injury 05/26/20 Foot Left;Lateral Deep Tissue Pressure Injury - Purple or maroon localized area of discolored intact skin or blood-filled blister due to damage of underlying soft tissue from pressure and/or shear. dark purple circular, 5cm x 5 c (Active)  05/26/20 0820  Location: Foot  Location Orientation: Left;Lateral  Staging: Deep Tissue Pressure Injury - Purple or maroon localized area of discolored intact skin or blood-filled blister due to damage of underlying soft tissue from pressure and/or shear.  Wound Description (Comments): dark purple circular, 5cm x 5 cm  Present on Admission: Yes     Resolved Hospital Problem list     Assessment & Plan:   Acute hypoxemic respiratory failure requiring high flow nasal cannula. Bilateral pulmonary edema, vascular congestion Bilateral pleural effusions Positive cumulative fluid balance Plan: Initiate CVVHD for volume removal. Continue diuretics for the meantime. Discussed offering therapeutic thoracentesis however would prefer slow pull with CVVHD for the time being if needed we can consider [place].  Acute on chronic systolic diastolic heart failure Pericardial effusion w/ tamponade status post pericardiocentesis Plan: Continue diuresis Volume control at this time will be initiated with CVVHD.  AKI on CKD stage IIIb. Hyperkalemia Hyponatremia Plan: CVVHD per nephrology  A. fib Plan: Heparin held for catheter placement. Restart heparin after procedure.  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  Labs   CBC: Recent Labs  Lab 05/23/20 0908 05/23/20 0908 05/16/2020 0910 06/03/2020 0910 06/05/2020 1214 05/27/2020 1215 05/26/20 0054 05/27/20 0548 05/28/20 0030  WBC 9.8  --  11.0*  --   --   --  14.6* 12.5* 10.8*  HGB 14.8   < > 14.3   < > 15.0 14.6 13.2 14.1 14.1  HCT 46.1   < > 44.0   < > 44.0 43.0 40.8 43.1 43.2  MCV 96.6  --  94.8  --   --   --  94.0 96.2 94.5  PLT 232  --  268  --   --   --  229 216 264   < > = values in this interval not displayed.    Basic Metabolic Panel: Recent Labs  Lab 05/22/20 0840 05/22/20 0840 05/23/20 0908 05/23/20 0908 05/24/20 1005 05/24/20 1005 06/04/2020 0910 05/20/2020 0910 06/04/2020 1214 06/09/2020 1215 05/26/20 0054 05/27/20 0548 05/28/20 0030  NA 134*   < > 128*   < > 130*   < > 130*   < > 133* 134* 132* 133* 134*  K 4.4   < > 4.5   < > 4.7   < > 4.8   < > 5.0 5.0 5.4* 5.3* 4.0  CL 97*   < > 94*   < > 94*  --  95*  --   --   --  98 97* 95*  CO2 28   < > 25   < > 25  --  25  --   --   --  26 26 29   GLUCOSE 254*   < > 273*   < > 247*  --  154*  --   --   --  333* 229* 131*  BUN 82*   < > 90*   < > 105*  --  118*  --   --   --  114* 119* 120*  CREATININE 1.69*   < > 1.99*   < > 2.09*  --  2.18*  --   --   --  2.18* 2.20* 1.98*  CALCIUM 8.5*   < > 8.4*   < > 8.8*  --  8.6*  --   --   --  8.6* 8.6* 8.9  MG 2.5*  --  2.5*  --  2.6*  --   --   --   --   --  2.6* 2.6*  --    < > = values in this interval not displayed.   GFR: Estimated Creatinine Clearance: 46.7 mL/min (A) (by C-G formula based on SCr of 1.98 mg/dL (H)). Recent Labs  Lab 05/14/2020 0910 05/26/20 0054 05/27/20 0548 05/28/20 0030  WBC 11.0* 14.6* 12.5* 10.8*    Liver Function Tests: Recent Labs  Lab 05/24/20 1005 05/26/20 0054 05/27/20 0548 05/28/20 0030  AST 125* 115* 130* 92*  ALT 227* 230* 226* 220*  ALKPHOS 134* 149* 154* 180*  BILITOT 0.5 0.6 0.4 0.8  PROT 6.3* 5.6* 5.9* 6.2*  ALBUMIN 3.1* 2.6* 2.8* 2.9*   No results for  input(s): LIPASE, AMYLASE in the last 168 hours. No results for input(s): AMMONIA in the last 168 hours.  ABG    Component Value Date/Time   PHART 7.403 05/19/2019 1943   PCO2ART 52.4 (H) 05/19/2019 1943   PO2ART 390.0 (H) 05/19/2019 1943   HCO3 26.3 05/15/2020 1215   TCO2 28 05/30/2020 1215   ACIDBASEDEF 3.0 (H) 06/04/2020 1215   O2SAT 64.0 05/21/2020 1215     Coagulation Profile: No results for input(s): INR, PROTIME in the last 168 hours.  Cardiac Enzymes: No results for input(s): CKTOTAL, CKMB, CKMBINDEX, TROPONINI in the last 168 hours.  HbA1C: Hgb A1c MFr Bld  Date/Time Value Ref Range Status  05/20/2020 06:29 AM 7.3 (H) 4.8 - 5.6 % Final    Comment:    (NOTE) Pre diabetes:          5.7%-6.4%  Diabetes:              >6.4%  Glycemic control for   <7.0% adults with diabetes   07/03/2019 11:13 PM 6.7 (H) 4.8 - 5.6 % Final    Comment:    (NOTE) Pre diabetes:          5.7%-6.4% Diabetes:              >6.4% Glycemic control for   <7.0% adults with diabetes     CBG: Recent Labs  Lab 05/27/20 0819 05/27/20 1124 05/27/20 1523 05/27/20 2219 05/28/20 0620  GLUCAP 298* 310* 387* 174* 149*    Review of Systems:   Review of Systems  Constitutional: Negative for chills, fever, malaise/fatigue and weight loss.  HENT: Negative for hearing loss, sore throat and tinnitus.   Eyes: Negative for blurred vision and double vision.  Respiratory: Positive for shortness of breath.  Negative for cough, hemoptysis, sputum production, wheezing and stridor.   Cardiovascular: Positive for orthopnea and leg swelling. Negative for chest pain, palpitations and PND.  Gastrointestinal: Negative for abdominal pain, constipation, diarrhea, heartburn, nausea and vomiting.  Genitourinary: Negative for dysuria, hematuria and urgency.  Musculoskeletal: Negative for joint pain and myalgias.  Skin: Negative for itching and rash.  Neurological: Negative for dizziness, tingling, weakness and  headaches.  Endo/Heme/Allergies: Negative for environmental allergies. Bruises/bleeds easily.  Psychiatric/Behavioral: Negative for depression. The patient is not nervous/anxious and does not have insomnia.   All other systems reviewed and are negative.    Past Medical History  He,  has a past medical history of Anxiety, CHF (congestive heart failure) ([place]), Collagen vascular disease ([place]), COPD (chronic obstructive pulmonary disease) ([place]), Coronary artery disease, COVID-19, Full dentures, GERD (gastroesophageal reflux disease), High cholesterol, History of MI (myocardial infarction), Hypertension, Incarcerated umbilical hernia (78/6767), Inguinal hernia (05/2016), Insulin dependent diabetes mellitus, Ischemic cardiomyopathy, Left leg cellulitis (10/08/2016), and Rheumatoid arthritis(714.0).   Surgical History    Past Surgical History:  Procedure Laterality Date  . CARDIAC CATHETERIZATION N/A 02/01/2015   Procedure: Left Heart Cath and Coronary Angiography;  Surgeon: Belva Crome, MD;  Location: [place] [facility];  Service: Cardiovascular;  Laterality: N/A;  . CARDIAC CATHETERIZATION N/A 03/22/2015   Procedure: Left Heart Cath and Coronary Angiography;  Surgeon: Leonie Man, MD;  Location: [place] [facility];  Service: Cardiovascular;  Laterality: N/A;  . CARDIAC CATHETERIZATION  09/05/2002; 03/09/2003; 04/10/2005;06/02/2005; 05/09/2006; 02/09/2007; 03/01/2007  . COLONOSCOPY  2008   Dr. Gala Romney: diverticulosis, hyperplastic polyp.  . CORONARY ANGIOPLASTY  11/14/2012; 02/01/2015  . CORONARY ANGIOPLASTY WITH STENT PLACEMENT  05/16/2012   "1; makes total ~ 4"  . CORONARY STENT INTERVENTION N/A 06/06/2017   Procedure: CORONARY STENT INTERVENTION;  Surgeon: Belva Crome, MD;  Location: [place] [facility];  Service: Cardiovascular;  Laterality: N/A;  . INSERTION OF MESH Bilateral 05/24/2016   Procedure: INSERTION OF MESH;  Surgeon: Coralie Keens, MD;  Location: [place];  Service:  General;  Laterality: Bilateral;  . LAPAROSCOPIC CHOLECYSTECTOMY    . LAPAROSCOPIC INGUINAL HERNIA WITH UMBILICAL HERNIA Bilateral 05/24/2016   Procedure: BILATERAL LAPAROSCOPIC INGUINAL HERNIA REPAIR WITH MESH AND UMBILICAL HERNIA REPAIR WITH MESH;  Surgeon: Coralie Keens, MD;  Location: [place];  Service: General;  Laterality: Bilateral;  . LEFT HEART CATH AND CORONARY ANGIOGRAPHY N/A 06/06/2017   Procedure: LEFT HEART CATH AND CORONARY ANGIOGRAPHY;  Surgeon: Belva Crome, MD;  Location: [place] [facility];  Service: Cardiovascular;  Laterality: N/A;  . LEFT HEART CATHETERIZATION WITH CORONARY ANGIOGRAM N/A 12/22/2011   Procedure: LEFT HEART CATHETERIZATION WITH CORONARY ANGIOGRAM;  Surgeon: Troy Sine, MD;  Location: [facility] CATH LAB;  Service: Cardiovascular;  Laterality: N/A;  . LEFT HEART CATHETERIZATION WITH CORONARY ANGIOGRAM Bilateral 05/16/2012   Procedure: LEFT HEART CATHETERIZATION WITH CORONARY ANGIOGRAM;  Surgeon: Lorretta Harp, MD;  Location: [facility] CATH LAB;  Service: Cardiovascular;  Laterality: Bilateral;  . LEFT HEART CATHETERIZATION WITH CORONARY ANGIOGRAM N/A 11/14/2012   Procedure: LEFT HEART CATHETERIZATION WITH CORONARY ANGIOGRAM;  Surgeon: Troy Sine, MD;  Location: [facility] CATH LAB;  Service: Cardiovascular;  Laterality: N/A;  . LEFT HEART CATHETERIZATION WITH CORONARY ANGIOGRAM N/A 09/01/2013   Procedure: LEFT HEART CATHETERIZATION WITH CORONARY ANGIOGRAM;  Surgeon: Leonie Man, MD;  Location: [facility] CATH LAB;  Service: Cardiovascular;  Laterality: N/A;  . [place]; 1973; 1985   x  3  . NM MYOCAR PERF WALL MOTION  08/30/2009   No significant ischemia  . OPEN REDUCTION INTERNAL FIXATION (ORIF) DISTAL RADIAL FRACTURE Right 12/10/2016   Procedure: OPEN REDUCTION INTERNAL FIXATION (ORIF) DISTAL RADIAL FRACTURE;  Surgeon: Iran Planas, MD;  Location: [place];  Service: Orthopedics;  Laterality: Right;  . PERCUTANEOUS CORONARY INTERVENTION-BALLOON ONLY   12/22/2011   Procedure: PERCUTANEOUS CORONARY INTERVENTION-BALLOON ONLY;  Surgeon: Troy Sine, MD;  Location[facility] CATH LAB;  Service: Cardiovascular;;  . PERCUTANEOUS CORONARY INTERVENTION-BALLOON ONLY  11/14/2012   Procedure: PERCUTANEOUS CORONARY INTERVENTION-BALLOON ONLY;  Surgeon: Troy Sine, MD;  Location: [facility] CATH LAB;  Service: Cardiovascular;;  . PERCUTANEOUS CORONARY STENT INTERVENTION (PCI-S)  05/16/2012   Procedure: PERCUTANEOUS CORONARY STENT INTERVENTION (PCI-S);  Surgeon: Lorretta Harp, MD;  Location: [facility] CATH LAB;  Service: Cardiovascular;;  . PERCUTANEOUS CORONARY STENT INTERVENTION (PCI-S)  09/01/2013   Procedure: PERCUTANEOUS CORONARY STENT INTERVENTION (PCI-S);  Surgeon: Leonie Man, MD;  Location: [facility] CATH LAB;  Service: Cardiovascular;;  . PERICARDIOCENTESIS N/A 06/06/2020   Procedure: PERICARDIOCENTESIS;  Surgeon: Belva Crome, MD;  Location: [place] [facility];  Service: Cardiovascular;  Laterality: N/A;  . RIGHT HEART CATH N/A 06/06/2020   Procedure: RIGHT HEART CATH;  Surgeon: Belva Crome, MD;  Location: [place] [facility];  Service: Cardiovascular;  Laterality: N/A;     Social History   reports that he has been smoking cigarettes. He has been smoking about 1.00 pack per day. He has never used smokeless tobacco. He reports that he does not drink alcohol and does not use drugs.   Family History   His family history includes Colon cancer in his brother and mother; Heart disease in his father and mother; Hypertension in his mother; Lung cancer in his mother.   Allergies Allergies  Allergen Reactions  . Ivp Dye [Iodinated Diagnostic Agents] Itching and Rash     Home Medications  Prior to Admission medications   Medication Sig Start Date End Date Taking? Authorizing Provider  acarbose (PRECOSE) 100 MG tablet Take 1 tablet (100 mg total) by mouth 3 (three) times daily with meals. 06/06/19  Yes Love, Ivan Anchors, PA-C  acetaminophen (TYLENOL) 650 MG CR tablet  Take 650 mg by mouth every 8 (eight) hours as needed for pain.   Yes [provider]  aspirin EC 81 MG tablet Take 1 tablet (81 mg total) by mouth daily. 02/12/20  Yes Croitoru, Mihai, MD  atorvastatin (LIPITOR) 80 MG tablet Take 1 tablet (80 mg total) by mouth daily at 6 PM. 12/30/19  Yes Croitoru, Mihai, MD  clopidogrel (PLAVIX) 75 MG tablet Take 1 tablet (75 mg total) by mouth daily. 10/07/19  Yes Kroeger, Daleen Snook M., PA-C  fish oil-omega-3 fatty acids 1000 MG capsule Take 1 g by mouth daily.    Yes [provider]  fluticasone Asencion Islam) 50 MCG/ACT nasal spray  11/19/19  Yes [provider]  folic acid (FOLVITE) 1 MG tablet Take 1 tablet (1 mg total) by mouth daily. 06/06/19  Yes Love, Ivan Anchors, PA-C  furosemide (LASIX) 40 MG tablet Take 2 tablets (80 mg total) by mouth daily. 04/12/20  Yes Croitoru, Mihai, MD  gabapentin (NEURONTIN) 300 MG capsule Take 300 mg by mouth 3 (three) times daily.   Yes [provider]  glimepiride (AMARYL) 2 MG tablet Take 1 tablet (2 mg total) by mouth daily with breakfast. 06/06/19  Yes Love, Ivan Anchors, PA-C  Insulin Glargine (TOUJEO SOLOSTAR [place]) Inject 25  Units into the skin at bedtime. 25 units    Yes [provider]  isosorbide mononitrate (IMDUR) 60 MG 24 hr tablet Take 1 tablet (60 mg total) by mouth daily. 02/12/20 02/06/21 Yes Croitoru, Mihai, MD  losartan (COZAAR) 25 MG tablet Take 0.5 tablets (12.5 mg total) by mouth daily. 09/29/19  Yes Kroeger, Daleen Snook M., PA-C  metoprolol tartrate (LOPRESSOR) 25 MG tablet Take 1 tablet (25 mg total) by mouth 2 (two) times daily. 09/29/19  Yes Kroeger, Daleen Snook M., PA-C  mometasone-formoterol (DULERA) 200-5 MCG/ACT AERO Inhale 2 puffs into the lungs 2 (two) times daily. 06/06/19  Yes Love, Ivan Anchors, PA-C  nitroGLYCERIN (NITROSTAT) 0.4 MG SL tablet Place 1 tablet (0.4 mg total) under the tongue every 5 (five) minutes x 3 doses as needed for chest pain. 04/24/16  Yes Croitoru, Mihai, MD  potassium  chloride SA (KLOR-CON) 20 MEQ tablet Take 1 tablet (20 mEq total) by mouth 2 (two) times daily. 02/03/20  Yes Kroeger, Daleen Snook M., PA-C  primidone (MYSOLINE) 50 MG tablet Take 100 mg by mouth at bedtime. 09/18/19  Yes [provider]  ALPRAZolam Duanne Moron) 0.5 MG tablet Take 1 tablet (0.5 mg total) by mouth 3 (three) times daily as needed for anxiety or sleep. Patient not taking: Reported on 05/27/2020 06/06/19   Love, Ivan Anchors, PA-C  collagenase (SANTYL) ointment Apply topically daily. And cover with damp to dry dressing daily. Change more frequently if soiled 06/06/19   Love, Ivan Anchors, PA-C  terbinafine (LAMISIL) 250 MG tablet Take 250 mg by mouth daily. Patient not taking: Reported on 06/10/2020 11/26/19   [provider]  vitamin C (VITAMIN C) 250 MG tablet Take 1 tablet (250 mg total) by mouth 2 (two) times daily. Patient not taking: Reported on 05/18/2020 06/06/19   Bary Leriche, PA-C     This patient is critically ill with multiple organ system failure; which, requires frequent high complexity decision making, assessment, support, evaluation, and titration of therapies. This was completed through the application of advanced monitoring technologies and extensive interpretation of multiple databases. During this encounter critical care time was devoted to patient care services described in this note for 32 minutes.  Garner Nash, DO [facility] 05/28/2020 11:51 AM

## 2020-05-28 NOTE — Consult Note (Addendum)
Hospital Consult    Reason for Consult:  Evaluate blood flow left leg Requesting Physician:  Lonny Prude MRN #:  [id number]  History of Present Illness: This is a 69 y.o. male who was admitted to the hospital 9 days ago with shortness of breath and weight gain over several weeks and was found to be hypoxic and thought to be secondary to CHF exacerbation.  His hospital course has been complicated by AKI on CKD with volume overload in need of aggressive diuresis with poor UOP.  TTE revealed EF of 35% and large pericardial effusion.  He underwent pericardiocentesis on 9/14.  His drain was removed on 9/16.    He has past medical hx significant for covid PNA and required hospitalization last year for about 6 weeks requiring ventilation.  Also has HTN, HLD, COPD, DM with peripheral neuropathy.  He has hx of PAF.  His eliquis is on hold due to pericardial effusion.  Heparin is being restarted today per cardiology.  He has hx of MVCAD and s/p multiple PCI-his asa/plavix are also on hold.    He has hx of left leg vein ablation by Dr. Donnetta Hutching in 2018.    Pt states prior to his hospitalization, he did not have any issues walking and denied any cramping in his calves.  He denies any pain at this time.  He states he cannot feel his left foot but this is not new.  He states that at the end of March 2018, he fell down the stairs and broke his big toe and his foot has been discolored since then.  He states that the area of blistering on his leg are new to this hospitalization.  RN reports that his doppler signals are sometimes good and other times, they are soft.  He does not have any issues with his right leg.    Arterial duplex from 2018 in [place] reveals triphasic waveforms throughout the popliteal artery, monophasic distally.  TBI at that time was 0.33.  Moderately severe LLE arterial occlusive disease probably both femoral popliteal and trifurcation components.  Repeat ABI at our office in July 2018 revealed  0.61/0.33 on the left.  His CFA measured 1.9cm AP by 1.9cm transverse. The left distal SFA had a 50-74% stenosis.  The left popliteal artery appears to be occluded with distal vessel and tibio-peroneal trunk appear to fill via collateral vessels. The right leg did not have any hemodynamically significant stenosis.  ABI in 2012 revealed moderate arterial insufficiency with ABI of 0.73 at [facility].   The pt was on a statin for cholesterol management prior to admit-his liver function enzymes are elevated and this is on hold.  The pt was on a daily aspirin prior to admit.   Other AC:  Plavix prior to admit The pt is on BB for hypertension.   The pt is diabetic.   Tobacco hx:  current  Past Medical History:  Diagnosis Date  . Anxiety   . CHF (congestive heart failure) ([place])   . Collagen vascular disease ([place])   . COPD (chronic obstructive pulmonary disease) ([place])   . Coronary artery disease   . COVID-19   . Full dentures   . GERD (gastroesophageal reflux disease)    Rolaids as needed  . High cholesterol   . History of MI (myocardial infarction)   . Hypertension    med. dosage increased 04/2016; has been on BP med. x 10 yrs.  . Incarcerated umbilical hernia 57/8469  . Inguinal hernia  05/2016   bilateral   . Insulin dependent diabetes mellitus   . Ischemic cardiomyopathy    a. 03/2015 EF 40-45% by LV gram.  . Left leg cellulitis 10/08/2016  . Rheumatoid arthritis(714.0)     Past Surgical History:  Procedure Laterality Date  . CARDIAC CATHETERIZATION N/A 02/01/2015   Procedure: Left Heart Cath and Coronary Angiography;  Surgeon: Belva Crome, MD;  Location: [place] [facility];  Service: Cardiovascular;  Laterality: N/A;  . CARDIAC CATHETERIZATION N/A 03/22/2015   Procedure: Left Heart Cath and Coronary Angiography;  Surgeon: Leonie Man, MD;  Location: [place] [facility];  Service: Cardiovascular;  Laterality: N/A;  . CARDIAC CATHETERIZATION  09/05/2002; 03/09/2003;  04/10/2005;06/02/2005; 05/09/2006; 02/09/2007; 03/01/2007  . COLONOSCOPY  2008   Dr. Gala Romney: diverticulosis, hyperplastic polyp.  . CORONARY ANGIOPLASTY  11/14/2012; 02/01/2015  . CORONARY ANGIOPLASTY WITH STENT PLACEMENT  05/16/2012   "1; makes total ~ 4"  . CORONARY STENT INTERVENTION N/A 06/06/2017   Procedure: CORONARY STENT INTERVENTION;  Surgeon: Belva Crome, MD;  Location: [place] [facility];  Service: Cardiovascular;  Laterality: N/A;  . INSERTION OF MESH Bilateral 05/24/2016   Procedure: INSERTION OF MESH;  Surgeon: Coralie Keens, MD;  Location: [place];  Service: General;  Laterality: Bilateral;  . LAPAROSCOPIC CHOLECYSTECTOMY    . LAPAROSCOPIC INGUINAL HERNIA WITH UMBILICAL HERNIA Bilateral 05/24/2016   Procedure: BILATERAL LAPAROSCOPIC INGUINAL HERNIA REPAIR WITH MESH AND UMBILICAL HERNIA REPAIR WITH MESH;  Surgeon: Coralie Keens, MD;  Location: [place];  Service: General;  Laterality: Bilateral;  . LEFT HEART CATH AND CORONARY ANGIOGRAPHY N/A 06/06/2017   Procedure: LEFT HEART CATH AND CORONARY ANGIOGRAPHY;  Surgeon: Belva Crome, MD;  Location: [place] [facility];  Service: Cardiovascular;  Laterality: N/A;  . LEFT HEART CATHETERIZATION WITH CORONARY ANGIOGRAM N/A 12/22/2011   Procedure: LEFT HEART CATHETERIZATION WITH CORONARY ANGIOGRAM;  Surgeon: Troy Sine, MD;  Location: [facility] CATH LAB;  Service: Cardiovascular;  Laterality: N/A;  . LEFT HEART CATHETERIZATION WITH CORONARY ANGIOGRAM Bilateral 05/16/2012   Procedure: LEFT HEART CATHETERIZATION WITH CORONARY ANGIOGRAM;  Surgeon: Lorretta Harp, MD;  Location: [facility] CATH LAB;  Service: Cardiovascular;  Laterality: Bilateral;  . LEFT HEART CATHETERIZATION WITH CORONARY ANGIOGRAM N/A 11/14/2012   Procedure: LEFT HEART CATHETERIZATION WITH CORONARY ANGIOGRAM;  Surgeon: Troy Sine, MD;  Location: [facility] CATH LAB;  Service: Cardiovascular;  Laterality: N/A;  . LEFT HEART CATHETERIZATION WITH CORONARY  ANGIOGRAM N/A 09/01/2013   Procedure: LEFT HEART CATHETERIZATION WITH CORONARY ANGIOGRAM;  Surgeon: Leonie Man, MD;  Location: [facility] CATH LAB;  Service: Cardiovascular;  Laterality: N/A;  . [place]; 1973; 1985   x 3  . NM MYOCAR PERF WALL MOTION  08/30/2009   No significant ischemia  . OPEN REDUCTION INTERNAL FIXATION (ORIF) DISTAL RADIAL FRACTURE Right 12/10/2016   Procedure: OPEN REDUCTION INTERNAL FIXATION (ORIF) DISTAL RADIAL FRACTURE;  Surgeon: Iran Planas, MD;  Location: [place];  Service: Orthopedics;  Laterality: Right;  . PERCUTANEOUS CORONARY INTERVENTION-BALLOON ONLY  12/22/2011   Procedure: PERCUTANEOUS CORONARY INTERVENTION-BALLOON ONLY;  Surgeon: Troy Sine, MD;  Location: [facility] CATH LAB;  Service: Cardiovascular;;  . PERCUTANEOUS CORONARY INTERVENTION-BALLOON ONLY  11/14/2012   Procedure: PERCUTANEOUS CORONARY INTERVENTION-BALLOON ONLY;  Surgeon: Troy Sine, MD;  Location: [facility] CATH LAB;  Service: Cardiovascular;;  . PERCUTANEOUS CORONARY STENT INTERVENTION (PCI-S)  05/16/2012   Procedure: PERCUTANEOUS CORONARY STENT INTERVENTION (PCI-S);  Surgeon: Lorretta Harp, MD;  Location: [facility]  CATH LAB;  Service: Cardiovascular;;  . PERCUTANEOUS CORONARY STENT INTERVENTION (PCI-S)  09/01/2013   Procedure: PERCUTANEOUS CORONARY STENT INTERVENTION (PCI-S);  Surgeon: Leonie Man, MD;  Location: [facility] CATH LAB;  Service: Cardiovascular;;  . PERICARDIOCENTESIS N/A 06/05/2020   Procedure: PERICARDIOCENTESIS;  Surgeon: Belva Crome, MD;  Location: [place] [facility];  Service: Cardiovascular;  Laterality: N/A;  . RIGHT HEART CATH N/A 05/24/2020   Procedure: RIGHT HEART CATH;  Surgeon: Belva Crome, MD;  Location: [place] [facility];  Service: Cardiovascular;  Laterality: N/A;    Allergies  Allergen Reactions  . Ivp Dye [Iodinated Diagnostic Agents] Itching and Rash    Prior to Admission medications   Medication Sig Start Date End Date Taking? Authorizing Provider  acarbose  (PRECOSE) 100 MG tablet Take 1 tablet (100 mg total) by mouth 3 (three) times daily with meals. 06/06/19  Yes Love, Ivan Anchors, PA-C  acetaminophen (TYLENOL) 650 MG CR tablet Take 650 mg by mouth every 8 (eight) hours as needed for pain.   Yes [provider]  aspirin EC 81 MG tablet Take 1 tablet (81 mg total) by mouth daily. 02/12/20  Yes Croitoru, Mihai, MD  atorvastatin (LIPITOR) 80 MG tablet Take 1 tablet (80 mg total) by mouth daily at 6 PM. 12/30/19  Yes Croitoru, Mihai, MD  clopidogrel (PLAVIX) 75 MG tablet Take 1 tablet (75 mg total) by mouth daily. 10/07/19  Yes Kroeger, Daleen Snook M., PA-C  fish oil-omega-3 fatty acids 1000 MG capsule Take 1 g by mouth daily.    Yes [provider]  fluticasone Asencion Islam) 50 MCG/ACT nasal spray  11/19/19  Yes [provider]  folic acid (FOLVITE) 1 MG tablet Take 1 tablet (1 mg total) by mouth daily. 06/06/19  Yes Love, Ivan Anchors, PA-C  furosemide (LASIX) 40 MG tablet Take 2 tablets (80 mg total) by mouth daily. 04/12/20  Yes Croitoru, Mihai, MD  gabapentin (NEURONTIN) 300 MG capsule Take 300 mg by mouth 3 (three) times daily.   Yes [provider]  glimepiride (AMARYL) 2 MG tablet Take 1 tablet (2 mg total) by mouth daily with breakfast. 06/06/19  Yes Love, Pamela S, PA-C  Insulin Glargine (TOUJEO SOLOSTAR [place]) Inject 25 Units into the skin at bedtime. 25 units    Yes [provider]  isosorbide mononitrate (IMDUR) 60 MG 24 hr tablet Take 1 tablet (60 mg total) by mouth daily. 02/12/20 02/06/21 Yes Croitoru, Mihai, MD  losartan (COZAAR) 25 MG tablet Take 0.5 tablets (12.5 mg total) by mouth daily. 09/29/19  Yes Kroeger, Daleen Snook M., PA-C  metoprolol tartrate (LOPRESSOR) 25 MG tablet Take 1 tablet (25 mg total) by mouth 2 (two) times daily. 09/29/19  Yes Kroeger, Daleen Snook M., PA-C  mometasone-formoterol (DULERA) 200-5 MCG/ACT AERO Inhale 2 puffs into the lungs 2 (two) times daily. 06/06/19  Yes Love, Ivan Anchors, PA-C  nitroGLYCERIN  (NITROSTAT) 0.4 MG SL tablet Place 1 tablet (0.4 mg total) under the tongue every 5 (five) minutes x 3 doses as needed for chest pain. 04/24/16  Yes Croitoru, Mihai, MD  potassium chloride SA (KLOR-CON) 20 MEQ tablet Take 1 tablet (20 mEq total) by mouth 2 (two) times daily. 02/03/20  Yes Kroeger, Daleen Snook M., PA-C  primidone (MYSOLINE) 50 MG tablet Take 100 mg by mouth at bedtime. 09/18/19  Yes [provider]  ALPRAZolam Duanne Moron) 0.5 MG tablet Take 1 tablet (0.5 mg total) by mouth 3 (three) times daily as needed for anxiety or sleep. Patient not taking:  Reported on 06/10/2020 06/06/19   Bary Leriche, PA-C  collagenase (SANTYL) ointment Apply topically daily. And cover with damp to dry dressing daily. Change more frequently if soiled 06/06/19   Love, Ivan Anchors, PA-C  terbinafine (LAMISIL) 250 MG tablet Take 250 mg by mouth daily. Patient not taking: Reported on 05/21/2020 11/26/19   [provider]  vitamin C (VITAMIN C) 250 MG tablet Take 1 tablet (250 mg total) by mouth 2 (two) times daily. Patient not taking: Reported on 05/29/2020 06/06/19   Love, Ivan Anchors, PA-C    Social History   Socioeconomic History  . Marital status: Widowed    Spouse name: Not on file  . Number of children: Not on file  . Years of education: Not on file  . Highest education level: Not on file  Occupational History  . Occupation: Clinical biochemist  Tobacco Use  . Smoking status: Current Some Day Smoker    Packs/day: 1.00    Types: Cigarettes    Last attempt to quit: 06/11/2015    Years since quitting: 4.9  . Smokeless tobacco: Never Used  . Tobacco comment: pt states smoking less than half a pack  Vaping Use  . Vaping Use: Never used  Substance and Sexual Activity  . Alcohol use: No  . Drug use: No  . Sexual activity: Yes  Other Topics Concern  . Not on file  Social History Narrative  . Not on file   Social Determinants of Health   Financial Resource Strain:   . Difficulty of Paying  Living Expenses: Not on file  Food Insecurity: No Food Insecurity  . Worried About Charity fundraiser in the Last Year: Never true  . Ran Out of Food in the Last Year: Never true  Transportation Needs: No Transportation Needs  . Lack of Transportation (Medical): No  . Lack of Transportation (Non-Medical): No  Physical Activity:   . Days of Exercise per Week: Not on file  . Minutes of Exercise per Session: Not on file  Stress: No Stress Concern Present  . Feeling of Stress : Not at all  Social Connections:   . Frequency of Communication with Friends and Family: Not on file  . Frequency of Social Gatherings with Friends and Family: Not on file  . Attends Religious Services: Not on file  . Active Member of Clubs or Organizations: Not on file  . Attends Archivist Meetings: Not on file  . Marital Status: Not on file  Intimate Partner Violence:   . Fear of Current or Ex-Partner: Not on file  . Emotionally Abused: Not on file  . Physically Abused: Not on file  . Sexually Abused: Not on file     Family History  Problem Relation Age of Onset  . Heart disease Father   . Colon cancer Brother        early 71s  . Hypertension Mother   . Heart disease Mother   . Colon cancer Mother        died age 65. late 84s early 34s at diagnosis.   . Lung cancer Mother     ROS: [x]  Positive   [ ]  Negative   [ ]  All sytems reviewed and are negative  Cardiac: []  chest pain/pressure [x]  hx MI [x]  hx MVCAD [x]  CHF [x]  SOB lying flat  Vascular: []  pain in legs while walking []  pain in legs at rest []  pain in legs at night [x]  non-healing ulcer-left lateral foot []   hx of DVT [x]  swelling in legs  Pulmonary: [x]  COPD [x]  covid PNA 2020  Neurologic: []  weakness or numbness in []  arms []  legs []  hx of CVA []  mini stroke [] difficulty speaking or slurred speech []  temporary loss of vision in one eye  Hematologic: [x]  bleeding problems-hemorrhagic pericardial  effusion  Endocrine:   [x]  diabetes  GI [x]  GERD  GU: [x]  AKI on CKD/renal failure []  HD--[]  M/W/F or []  T/T/S  Psychiatric: []  anxiety []  depression  Musculoskeletal: [x]  RA  Integumentary: []  rashes [x]  ulcers  Constitutional: []  fever  []  chills  Physical Examination  Vitals:   05/28/20 1149 05/28/20 1200  BP:  100/70  Pulse:  93  Resp:  (!) 22  Temp: 97.6 F (36.4 C)   SpO2:  (!) 88%   Body mass index is 33.37 kg/m.  General:  WDWN in NAD Gait: Not observed HENT: WNL, normocephalic Pulmonary: normal non-labored breathing, without Rales, rhonchi,  wheezing Cardiac: irregular Abdomen:  soft, NT/ND, no masses; aortic pulse is not palpable Skin: without rashes Vascular Exam/Pulses:  Right Left  Radial Unable to palpate 2+ (normal)  Ulnar 1+ (weak) Unable to palpate  Femoral 2+ (normal) 2+ (normal)  Popliteal Unable to palpate Unable to palpate  DP Monophasic Faint monophasic  PT Brisk biphasic Faint monophasic   Extremities:       Musculoskeletal: no muscle wasting or atrophy  Neurologic: A&O X 3;  No focal weakness or paresthesias are detected; speech is fluent/normal Psychiatric:  The pt has Normal affect.   CBC    Component Value Date/Time   WBC 10.8 (H) 05/28/2020 0030   RBC 4.57 05/28/2020 0030   HGB 14.1 05/28/2020 0030   HGB 16.3 09/25/2017 1135   HCT 43.2 05/28/2020 0030   HCT 47.1 09/25/2017 1135   PLT 264 05/28/2020 0030   PLT 159 09/25/2017 1135   MCV 94.5 05/28/2020 0030   MCV 91 09/25/2017 1135   MCH 30.9 05/28/2020 0030   MCHC 32.6 05/28/2020 0030   RDW 14.4 05/28/2020 0030   RDW 14.5 09/25/2017 1135   LYMPHSABS 0.6 (L) 05/20/2020 0629   MONOABS 1.1 (H) 05/20/2020 0629   EOSABS 0.0 05/20/2020 0629   BASOSABS 0.0 05/20/2020 0629    BMET    Component Value Date/Time   NA 134 (L) 05/28/2020 0030   NA 135 10/17/2019 1628   K 4.0 05/28/2020 0030   CL 95 (L) 05/28/2020 0030   CO2 29 05/28/2020 0030   GLUCOSE 131  (H) 05/28/2020 0030   BUN 120 (H) 05/28/2020 0030   BUN 34 (H) 10/17/2019 1628   CREATININE 1.98 (H) 05/28/2020 0030   CALCIUM 8.9 05/28/2020 0030   GFRNONAA 34 (L) 05/28/2020 0030   GFRAA 39 (L) 05/28/2020 0030    COAGS: Lab Results  Component Value Date   INR 1.2 05/19/2019   INR 1.1 04/11/2019   INR 1.02 10/08/2016     Non-Invasive Vascular Imaging:   ABI 05/28/2020: ABI  Right 1.15    Left  0.67     ABI and LLE arterial duplex 2018 at [facility] Repeat ABI at our office in July 2018 revealed 0.61/0.33 on the left.  His CFA measured 1.9cm AP by 1.9cm transverse. The left distal SFA had a 50-74% stenosis.  The left popliteal artery appears to be occluded with distal vessel and tibio-peroneal trunk appear to fill via collateral vessels. The right leg did not have any hemodynamically significant stenosis.    ASSESSMENT/PLAN: This is a  70 y.o. male with hx of moderate LLE arterial disease dating back years.  - In July 2018, pt had decreased ABI/TBI of 0.61/0.33 and arterial duplex revealed 50-74% distal left SFA stenosis and left popliteal artery appeared to be occluded with distal vessel and tibio-peroneal trunk filled via collateral vessels.   He is now hospitalized with CHF, AKI on CKD with decreased doppler flow left foot with small wound and the dorsum of the great toe and lateral foot that most likely will not heal.   He is unable to wiggle his toes and sensation is not in tact, but per pt, this is chronic.   -He does have hx of LLE laser ablation of the GSV in 2018.  -pt will need either arteriogram or CTA with runoff, however, pt with AKI on CKD.  He had temporary dialysis catheter placed today for dialysis.   -cardiology gave ok to restart heparin today but holding on DOAC.   Continue holding this for possible arteriogram.  -Dr. Donzetta Matters to see pt this afternoon   Leontine Locket, PA-C [facility] [phone]  I have independently interviewed and  examined the patient at the bedside.  Per the patient and the medical record appears that he has chronic changes of his left lower extremity.  Given new onset need for dialysis would hold on angiography till has improved renal function and also improved pulmonary function given the necessity to lie flat for procedure.  Would likely not obtain CT angio given inability to control contrast dose and inability to treat blockage which appears to be at the very least SFA occlusion.  We'll follow peripherally to get him scheduled for angiography when medically safe, may need to occur as an outpatient.    Kianah Harries C. Donzetta Matters, MD [facility] [place] Office: [phone] Pager: [phone]

## 2020-05-28 NOTE — Progress Notes (Signed)
ANTICOAGULATION CONSULT NOTE - Initial Consult  Pharmacy Consult for heparin Indication: atrial fibrillation  Allergies  Allergen Reactions  . Ivp Dye [Iodinated Diagnostic Agents] Itching and Rash    Patient Measurements: Height: 6' (182.9 cm) Weight: 111.6 kg (246 lb 0.5 oz) IBW/kg (Calculated) : 77.6 Heparin Dosing Weight: 103  Vital Signs: Temp: 97.5 F (36.4 C) (09/17 0400) Temp Source: Oral (09/17 0400) BP: 137/80 (09/17 0900) Pulse Rate: 109 (09/17 0900)  Labs: Recent Labs    05/26/20 0054 05/26/20 0054 05/27/20 0548 05/28/20 0030  HGB 13.2   < > 14.1 14.1  HCT 40.8  --  43.1 43.2  PLT 229  --  216 264  CREATININE 2.18*  --  2.20* 1.98*   < > = values in this interval not displayed.    Estimated Creatinine Clearance: 46.7 mL/min (A) (by C-G formula based on SCr of 1.98 mg/dL (H)).   Medical History: Past Medical History:  Diagnosis Date  . Anxiety   . CHF (congestive heart failure) ([name])   . Collagen vascular disease ([place])   . COPD (chronic obstructive pulmonary disease) ([place])   . Coronary artery disease   . COVID-19   . Full dentures   . GERD (gastroesophageal reflux disease)    Rolaids as needed  . High cholesterol   . History of MI (myocardial infarction)   . Hypertension    med. dosage increased 04/2016; has been on BP med. x 10 yrs.  . Incarcerated umbilical hernia 11/1592  . Inguinal hernia 05/2016   bilateral   . Insulin dependent diabetes mellitus   . Ischemic cardiomyopathy    a. 03/2015 EF 40-45% by LV gram.  . Left leg cellulitis 10/08/2016  . Rheumatoid arthritis(714.0)     Assessment: 6 yoM on apixaban PTA for hx AFib admitted with pericardial tamponade s/p pericardiocentesis. Anticoagulation has been held, drain now removed 9/16. Pharmacy asked to restart IV heparin with no bolus, will target lower goal. CBC wnl this morning.  Goal of Therapy:  Heparin level 0.3-0.5 units/ml Monitor platelets by anticoagulation protocol: Yes    Plan:  -Heparin 1200 units/h no bolus -Check heparin level in 8h  Arrie Senate, PharmD, BCPS Clinical Pharmacist [phone] Please check AMION for all [place] numbers 05/28/2020

## 2020-05-28 NOTE — Progress Notes (Signed)
Progress Note  Patient Name: Robert [name] Date of Encounter: 05/28/2020  [facility] Cardiologist: Sanda Klein, MD   Subjective   Excellent diuresis, breathing remains difficult. Improved renal parameters. Edema improved, especially in left lower limb.  Left foot noted to be a little cool and more cyanotic. Weight 246 lb: down 12 lb from peak during this admission. Estimated dry weight in 219-228 lb range pre-tamponade.  Inpatient Medications    Scheduled Meds: . amiodarone  200 mg Oral BID  . Chlorhexidine Gluconate Cloth  6 each Topical Daily  . dextromethorphan-guaiFENesin  1 tablet Oral BID  . fluticasone  1 spray Each Nare Daily  . folic acid  1 mg Oral Daily  . gabapentin  300 mg Oral TID  . heparin  5,000 Units Subcutaneous Q8H  . influenza vaccine adjuvanted  0.5 mL Intramuscular Tomorrow-1000  . insulin aspart  0-20 Units Subcutaneous TID WC  . insulin aspart  6 Units Subcutaneous TID WC  . insulin glargine  10 Units Subcutaneous Daily  . ipratropium-albuterol  3 mL Nebulization Q6H  . melatonin  3 mg Oral QHS  . metoprolol tartrate  25 mg Oral BID  . mometasone-formoterol  2 puff Inhalation BID  . primidone  100 mg Oral QHS  . sodium chloride flush  3 mL Intravenous Q12H  . sodium chloride flush  3 mL Intravenous Q12H   Continuous Infusions: . sodium chloride    . sodium chloride    . furosemide (LASIX) infusion 10 mg/hr (05/28/20 0900)   PRN Meds: sodium chloride, sodium chloride, acetaminophen, Muscle Rub, ondansetron (ZOFRAN) IV, oxyCODONE, sodium chloride flush, sodium chloride flush   Vital Signs    Vitals:   05/28/20 0600 05/28/20 0700 05/28/20 0800 05/28/20 0900  BP:  (!) 129/99 (!) 129/95 137/80  Pulse:  93 (!) 102 (!) 109  Resp:  (!) 28 (!) 21 (!) 29  Temp:      TempSrc:      SpO2:  91% 94% 92%  Weight: 111.6 kg     Height:        Intake/Output Summary (Last 24 hours) at 05/28/2020 0944 Last data filed at 05/28/2020 0900 Gross  per 24 hour  Intake 739.37 ml  Output 3170 ml  Net -2430.63 ml   Last 3 Weights 05/28/2020 05/27/2020 05/26/2020  Weight (lbs) 246 lb 0.5 oz 248 lb 7.3 oz 263 lb 0.1 oz  Weight (kg) 111.6 kg 112.7 kg 119.3 kg      Telemetry    AFib, rate controlled - Personally Reviewed  ECG    No new tracing - Personally Reviewed  Physical Exam  Looks tired GEN: No acute distress.   Neck: 10 cm JVD Cardiac: irregular, no murmurs, rubs, or gallops.  Respiratory: bilateral wheezes GI: Soft, nontender, non-distended  MS: L 1+ ankle edema, 2+ pedal edema; clean based superficial popped blister anterior shin; R 3+ pitting edema of foot and lower half of calf; No deformity. Cool slightly dusky L foot with chronic hyperpigmentation. Neuro:  Nonfocal  Psych: Normal affect   Labs    High Sensitivity Troponin:   Recent Labs  Lab 05/16/20 1228 05/16/20 1436 05/24/2020 1310 05/13/2020 1653  TROPONINIHS 10 10 9 10       Chemistry Recent Labs  Lab 05/26/20 0054 05/27/20 0548 05/28/20 0030  NA 132* 133* 134*  K 5.4* 5.3* 4.0  CL 98 97* 95*  CO2 26 26 29   GLUCOSE 333* 229* 131*  BUN 114* 119* 120*  CREATININE 2.18* 2.20* 1.98*  CALCIUM 8.6* 8.6* 8.9  PROT 5.6* 5.9* 6.2*  ALBUMIN 2.6* 2.8* 2.9*  AST 115* 130* 92*  ALT 230* 226* 220*  ALKPHOS 149* 154* 180*  BILITOT 0.6 0.4 0.8  GFRNONAA 30* 30* 34*  GFRAA 35* 35* 39*  ANIONGAP 8 10 10      Hematology Recent Labs  Lab 05/26/20 0054 05/27/20 0548 05/28/20 0030  WBC 14.6* 12.5* 10.8*  RBC 4.34 4.48 4.57  HGB 13.2 14.1 14.1  HCT 40.8 43.1 43.2  MCV 94.0 96.2 94.5  MCH 30.4 31.5 30.9  MCHC 32.4 32.7 32.6  RDW 14.5 14.6 14.4  PLT 229 216 264    BNPNo results for input(s): BNP, PROBNP in the last 168 hours.   DDimer No results for input(s): DDIMER in the last 168 hours.   Radiology    DG CHEST PORT 1 VIEW  Result Date: 05/28/2020 CLINICAL DATA:  Acute respiratory failure with hypoxia. Follow-up exam. EXAM: PORTABLE CHEST 1  VIEW COMPARISON:  05/29/2020 and 05/16/2020. FINDINGS: There is increased opacity at the right lung base compared to the previous exam, with opacity no obscuring the hemidiaphragm. Lungs demonstrate persistent bilateral interstitial thickening with hazy airspace type opacity in the perihilar to lower lungs. No pneumothorax. IMPRESSION: 1. Interval worsening of lung aeration on the right with increased right lung base opacity. This is most likely due to an enlarged pleural effusion. 2. Persistent interstitial and hazy airspace opacities, which may reflect multifocal infection or pulmonary edema. Additional lung base opacity on the left is likely a combination of a small effusion and atelectasis, also stable. Electronically Signed   By: Lajean Manes M.D.   On: 05/28/2020 08:39   ECHOCARDIOGRAM LIMITED  Result Date: 05/26/2020    ECHOCARDIOGRAM LIMITED REPORT   Patient Name:   Robert [name] Date of Exam: 05/26/2020 Medical Rec #:  [id number]      Height:       72.0 in Accession #:    [id number]     Weight:       263.0 lb Date of Birth:  1951/06/24      BSA:          2.394 m Patient Age:    69 years       BP:           102/50 mmHg Patient Gender: M              HR:           100 bpm. Exam Location:  Inpatient Procedure: 2D Echo, Cardiac Doppler, Color Doppler and Intracardiac            Opacification Agent Indications:    Pericardial effusion 423.9 / I31.3  History:        Patient has prior history of Echocardiogram examinations, most                 recent 05/27/2020. CAD and Previous Myocardial Infarction, COPD,                 Arrythmias:non-specific ST changes, Signs/Symptoms:Dyspnea; Risk                 Factors:Hypertension, Diabetes, Dyslipidemia and Current Smoker.                 Pericardial effusion.  Sonographer:    Vickie Epley RDCS Referring Phys: [place]  1. Left ventricular ejection fraction, by estimation, is 35 to 40%. The left ventricle  has moderately decreased function. The  left ventricle demonstrates regional wall motion abnormalities (see scoring diagram/findings for description). There is moderate akinesis of the left ventricular, basal-mid inferior segment.  2. The mitral valve is normal in structure. No evidence of mitral valve regurgitation. No evidence of mitral stenosis.  3. The aortic valve is normal in structure. Aortic valve regurgitation is not visualized. FINDINGS  Left Ventricle: Left ventricular ejection fraction, by estimation, is 35 to 40%. The left ventricle has moderately decreased function. The left ventricle demonstrates regional wall motion abnormalities. Moderate akinesis of the left ventricular, basal-mid inferior segment. Definity contrast agent was given IV to delineate the left ventricular endocardial borders. Mitral Valve: The mitral valve is normal in structure. No evidence of mitral valve stenosis. Aortic Valve: The aortic valve is normal in structure. Aortic valve regurgitation is not visualized. Mertie Moores MD Electronically signed by Mertie Moores MD Signature Date/Time: 05/26/2020/1:47:33 PM    Final     Cardiac Studies   As above  Patient Profile     69 y.o. male with pericardial tamponade and biventricular failure complicated by acute on CKD, paroxysmalatrial fibrillation, on background of longstanding CAD and COPD, DM, PAD, rheumatoid arthritis on immunosuppressive drugs. Pericardiocentesis 1000 mL on 9/14.   Assessment & Plan    1. Pericardial tamponade:pericardiocentesis 9/14, drain DC'd 9/16). Etiology unclear (onset before anticoagulation was restarted).  No clinical signs of recurrence, we are still dealing with the 35-40 lb fluid gain that gradually occurred before hospitalization. Recheck limited echo on Monday. If fluid returns, needs pericardial window. Cytology: - "No malignant cells identified, Satisfactory but limited for evaluation, scant cellularity". Consider evaluation for lung neoplasm with CT (after recovery of  renal function). 2. 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 Diuresing well on hi-dose loop diuretic + thiazide. He has had venous ablation in LLE, which may explain worse residual swelling on right.. 3. AKI on RAQ:[id number] his COVID-19 infection a year ago, baseline creat seems to be around 1.6. Peaked at 2.2 and now down to 1.98, BUN 120 in face of excellent negative fluid balance. Note resolution of mild hyperkalemia. 4. PAFib:recurrent paroxysmal. Reasonably rate controlled. Anticoagulation is not appropriate currently. Rate control is fair. On oral amiodarone since yesterday, try to avoid agents that would drop BP and worsen renal perfusion. No other appropriate antiarrhythmics due to his comorbid conditions. 5. COPD:chronic NYHA class 2-3a dyspnea even when well compensated from heart point of view. Was not on O2 at home. 6. CAD:no angina, but LVEF appears to be lower. Ischemic reevaluation after recovery. Not a candidate for angiography right now. His wall motion abnormality matches the expected infarction in the left circumflex territory. Probably [place] as OP in a few weeks. 7. PAD: he has known hi-grade stenosis in L SFA and popliteal artery occlusion with reconstitution via collaterals per [place] in 2018. However, since he is off anticoagulation and antiplatelet agents, need to reassess for worsening arterial supply. Check Doppler. Has seen Dr. Donnetta Hutching in the past.    Overall better and I think he is appropriate for transfer out of ICU.  Will likely need at least another 4 days of IV diuretics and needs repeat echo for pericardial effusion early next week. OK to restart heparin IV for AFib and PAD, but will avoid bolus and avoid DOAC until we are confident hemopericardium is not re-accumulating.  For questions or updates, please contact [place] Please consult [email or web address] for contact info under  Signed, Sanda Klein,  MD  05/28/2020, 9:44 AM

## 2020-05-29 ENCOUNTER — Encounter (HOSPITAL_COMMUNITY): Payer: Self-pay | Admitting: Internal Medicine

## 2020-05-29 LAB — HEPARIN LEVEL (UNFRACTIONATED)
Heparin Unfractionated: 0.22 IU/mL — ABNORMAL LOW (ref 0.30–0.70)
Heparin Unfractionated: 0.39 IU/mL (ref 0.30–0.70)

## 2020-05-29 LAB — COMPREHENSIVE METABOLIC PANEL
ALT: 179 U/L — ABNORMAL HIGH (ref 0–44)
AST: 79 U/L — ABNORMAL HIGH (ref 15–41)
Albumin: 2.9 g/dL — ABNORMAL LOW (ref 3.5–5.0)
Alkaline Phosphatase: 157 U/L — ABNORMAL HIGH (ref 38–126)
Anion gap: 9 (ref 5–15)
BUN: 78 mg/dL — ABNORMAL HIGH (ref 8–23)
CO2: 26 mmol/L (ref 22–32)
Calcium: 8.5 mg/dL — ABNORMAL LOW (ref 8.9–10.3)
Chloride: 99 mmol/L (ref 98–111)
Creatinine, Ser: 1.31 mg/dL — ABNORMAL HIGH (ref 0.61–1.24)
GFR calc Af Amer: >60 mL/min (ref >60)
GFR calc non Af Amer: 56 mL/min — ABNORMAL LOW (ref >60)
Glucose, Bld: 170 mg/dL — ABNORMAL HIGH (ref 70–99)
Potassium: 4.2 mmol/L (ref 3.5–5.1)
Sodium: 134 mmol/L — ABNORMAL LOW (ref 135–145)
Total Bilirubin: 0.8 mg/dL (ref 0.3–1.2)
Total Protein: 6.1 g/dL — ABNORMAL LOW (ref 6.5–8.1)

## 2020-05-29 LAB — RENAL FUNCTION PANEL
Albumin: 2.9 g/dL — ABNORMAL LOW (ref 3.5–5.0)
Anion gap: 6 (ref 5–15)
BUN: 58 mg/dL — ABNORMAL HIGH (ref 8–23)
CO2: 28 mmol/L (ref 22–32)
Calcium: 8.3 mg/dL — ABNORMAL LOW (ref 8.9–10.3)
Chloride: 101 mmol/L (ref 98–111)
Creatinine, Ser: 1.21 mg/dL (ref 0.61–1.24)
GFR calc Af Amer: >60 mL/min (ref >60)
GFR calc non Af Amer: >60 mL/min (ref >60)
Glucose, Bld: 170 mg/dL — ABNORMAL HIGH (ref 70–99)
Phosphorus: 2.3 mg/dL — ABNORMAL LOW (ref 2.5–4.6)
Potassium: 4.5 mmol/L (ref 3.5–5.1)
Sodium: 135 mmol/L (ref 135–145)

## 2020-05-29 LAB — CBC
HCT: 41.4 % (ref 39.0–52.0)
Hemoglobin: 13.3 g/dL (ref 13.0–17.0)
MCH: 30.2 pg (ref 26.0–34.0)
MCHC: 32.1 g/dL (ref 30.0–36.0)
MCV: 93.9 fL (ref 80.0–100.0)
Platelets: 177 10*3/uL (ref 150–400)
RBC: 4.41 MIL/uL (ref 4.22–5.81)
RDW: 14.2 % (ref 11.5–15.5)
WBC: 8.5 10*3/uL (ref 4.0–10.5)
nRBC: 0 % (ref 0.0–0.2)

## 2020-05-29 LAB — PHOSPHORUS: Phosphorus: 2.9 mg/dL (ref 2.5–4.6)

## 2020-05-29 LAB — GLUCOSE, CAPILLARY
Glucose-Capillary: 177 mg/dL — ABNORMAL HIGH (ref 70–99)
Glucose-Capillary: 228 mg/dL — ABNORMAL HIGH (ref 70–99)
Glucose-Capillary: 174 mg/dL — ABNORMAL HIGH (ref 70–99)
Glucose-Capillary: 129 mg/dL — ABNORMAL HIGH (ref 70–99)

## 2020-05-29 LAB — APTT
aPTT: 116 seconds — ABNORMAL HIGH (ref 24–36)
aPTT: 37 seconds — ABNORMAL HIGH (ref 24–36)
aPTT: 52 seconds — ABNORMAL HIGH (ref 24–36)

## 2020-05-29 LAB — MAGNESIUM: Magnesium: 2.5 mg/dL — ABNORMAL HIGH (ref 1.7–2.4)

## 2020-05-29 MED ORDER — HEPARIN (PORCINE) 25000 UT/250ML-% IV SOLN
1300.0000 [IU]/h | INTRAVENOUS | Status: DC
  Administered 2020-05-29 – 2020-05-31 (×3): 1200 [IU]/h via INTRAVENOUS
  Administered 2020-06-01: 1300 [IU]/h via INTRAVENOUS
  Filled 2020-05-29 (×4): qty 250

## 2020-05-29 MED ORDER — IPRATROPIUM-ALBUTEROL 0.5-2.5 (3) MG/3ML IN SOLN
3.0000 mL | Freq: Two times a day (BID) | RESPIRATORY_TRACT | Status: DC
  Administered 2020-05-29 – 2020-06-07 (×19): 3 mL via RESPIRATORY_TRACT
  Filled 2020-05-29 (×19): qty 3

## 2020-05-29 MED ORDER — SODIUM CHLORIDE 0.9% FLUSH: 10.0000 mL | INTRAVENOUS | Status: DC | PRN

## 2020-05-29 MED ORDER — SODIUM CHLORIDE 0.9% FLUSH
10.0000 mL | Freq: Two times a day (BID) | INTRAVENOUS | Status: DC
  Administered 2020-05-29: 20 mL
  Administered 2020-05-29 – 2020-06-06 (×17): 10 mL

## 2020-05-29 MED ORDER — ALBUTEROL SULFATE (2.5 MG/3ML) 0.083% IN NEBU: 2.5000 mg | INHALATION_SOLUTION | Freq: Four times a day (QID) | RESPIRATORY_TRACT | Status: DC | PRN

## 2020-05-29 NOTE — Progress Notes (Signed)
Meta for heparin Indication: atrial fibrillation  Allergies  Allergen Reactions  . Ivp Dye [Iodinated Diagnostic Agents] Itching and Rash    Patient Measurements: Height: 6' (182.9 cm) Weight: 107.5 kg (236 lb 15.9 oz) IBW/kg (Calculated) : 77.6 Heparin Dosing Weight: 103  Vital Signs: Temp: 97.6 F (36.4 C) (09/18 0750) Temp Source: Oral (09/18 0600) BP: 119/69 (09/18 1100) Pulse Rate: 86 (09/18 1100)  Labs: Recent Labs    05/27/20 0548 05/27/20 0548 05/28/20 0030 05/28/20 1623 05/28/20 2311 05/28/20 2350 05/29/20 0343 05/29/20 1011  HGB 14.1   < > 14.1  --   --   --  13.3  --   HCT 43.1  --  43.2  --   --   --  41.4  --   PLT 216  --  264  --   --   --  177  --   APTT  --   --   --   --   --  116*  --  37*  HEPARINUNFRC  --   --   --   --  1.09*  --   --  0.22*  CREATININE 2.20*   < > 1.98* 1.76*  --   --  1.31*  --    < > = values in this interval not displayed.    Estimated Creatinine Clearance: 69.3 mL/min (A) (by C-G formula based on SCr of 1.31 mg/dL (H)).   Assessment: 89 yoM on apixaban PTA for hx AFib admitted with pericardial tamponade s/p pericardiocentesis. Anticoagulation has been held, drain now removed 9/16. Pharmacy asked to restart IV heparin with no bolus, will target lower goal. Last dose of apixaban given 9/13 a.m.   Heparin level and aPTT now below goal. No issues with line or bleeding reported per RN.  Levels collected by phelbotomy away from IV infusion site.  Suspect elevated levels earlier this AM may have been collected closer to where heparin was running.  Goal of Therapy:  Heparin level 0.3-0.5 units/ml; PTT 66-84 sec Monitor platelets by anticoagulation protocol: Yes   Plan:  -Increase IV heparin to 1200 units/hr. -Repeat heparin level and APTT in 8 hrs. -Daily heparin level and CBC.  [place] Crane, Roylene Reason, [facility] Clinical Pharmacist  05/29/2020 11:28 AM   [facility] pharmacy phone  numbers are listed on [email or web address]

## 2020-05-29 NOTE — Progress Notes (Signed)
[facility] Progress Note  Name: Robert [name] MRN: [id number] DOB: 06-09-1951   Subjective:  Started CRRT yesterday 4pm for ongoing suboptimal diuresis and volume overload.  Clinically says feels about the same.  +orthopnea unchanged.  Tired.  Daughter bedside.  Review of systems: Reports shortness of breath - about the same as yesterday but states he is "doing fine" Denies cp  Denies n/v   Intake/Output Summary (Last 24 hours) at 05/29/2020 1208 Last data filed at 05/29/2020 1100 Gross per 24 hour  Intake 1108.4 ml  Output 3091 ml  Net -1982.6 ml    Vitals:  Vitals:   05/29/20 1030 05/29/20 1100 05/29/20 1130 05/29/20 1144  BP: 112/60 119/69 (!) 124/93   Pulse: 90 86 81   Resp: (!) 25 (!) 22 (!) 26   Temp:    (!) 96.4 F (35.8 C)  TempSrc:    Axillary  SpO2: 94% 96% 94%   Weight:      Height:         Physical Exam:  General adult male in bed chronically ill appearing   HEENT normocephalic atraumatic extraocular movements intact sclera anicteric Neck supple trachea midline Lungs clearer few basilar crackles; inc work of breathing at rest Heart S1S2 tachy Abdomen soft nontender obese habitus Extremities 3+ edema bilaterally  Psych normal mood and affect Neuro alert and oriented x3 provides hx and follows commands   Medications reviewed  Labs:  BMP Latest Ref Rng & Units 05/29/2020 05/28/2020 05/28/2020  Glucose 70 - 99 mg/dL 170(H) 236(H) 131(H)  BUN 8 - 23 mg/dL 78(H) 123(H) 120(H)  Creatinine 0.61 - 1.24 mg/dL 1.31(H) 1.76(H) 1.98(H)  BUN/Creat Ratio 10 - 24 - - -  Sodium 135 - 145 mmol/L 134(L) 133(L) 134(L)  Potassium 3.5 - 5.1 mmol/L 4.2 4.2 4.0  Chloride 98 - 111 mmol/L 99 94(L) 95(L)  CO2 22 - 32 mmol/L 26 25 29   Calcium 8.9 - 10.3 mg/dL 8.5(L) 8.6(L) 8.9     Assessment/Plan:   1. AKI on CKD3b  1. AKI 2/2 acute CHF and pericardial effusion.  2. Suboptimal diuresis so started UF with CRRT 9/17 -- currently net neg 50-179mL/hr,  increase to 100-155mL/hr net neg as tolerated 3. K 4.2, Phos 2.9 - cont currentfluids  2. AoC sCHF exacerbation - appreciate cardiology.   3. Pericardial effusion s/p paracardiocentesis   4. Acute hypoxic resp failure - optimizing diuretics as above; copd per primary  5. Hyperkalemia - resolved  6. Hyponatremia, hypervolemic - improving   7. DM2 per primary team   8. COPD - decreased resp reserve; per primary  9. AFib, rate control, anticoagulation per primary team   Justin Mend, MD 05/29/2020  12:08 PM

## 2020-05-29 NOTE — Progress Notes (Signed)
NAME:  Robert [name], MRN:  [id number], DOB:  1951/06/25, LOS: 78 ADMISSION DATE:  05/21/2020, CONSULTATION DATE:  05/28/2020 REFERRING MD:  Dr. Teryl Lucy, CHIEF COMPLAINT:  Hypoxic    Brief History   69 year old gentleman past medical history of hypertension, hyperlipidemia, COPD, congestive heart failure, CKD stage IIIb.  Patient also has new systolic and diastolic cardiomyopathy, acute on chronic with ejection fraction 35 to 40%.  Patient with increasing oxygen requirements.  New enlarging pleural effusion.  Decision made for need of CVVHD after discussion with nephrology.  Pulmonary critical care consulted.  History of present illness   This is a 69 year old gentleman past medical history of hypertension hyperlipidemia, COPD, congestive heart failure, CKD 3B.  New systolic diastolic cardiomyopathy with an EF of 35 to 40%.  Patient has been on maximal diuretics with tapering urine output.  He had declined HD over the past couple of days hoping that the diuretics would continue to work.  Unfortunately had increasing oxygen requirements.  Chest x-ray completed today with a large right-sided pleural effusion as well as fluid retention in his bilateral lower extremities and pulmonary vascular congestion.  Decision was made for consultation pulmonary critical care due to increasing oxygen requirements and consideration for CVVHD.  I discussed case with nephrology.  They are in agreement to initiate CVVHD at this time.  Past Medical History   Past Medical History:  Diagnosis Date  . Anxiety   . CHF (congestive heart failure) ([place])   . Collagen vascular disease ([place])   . COPD (chronic obstructive pulmonary disease) ([place])   . Coronary artery disease   . COVID-19   . Full dentures   . GERD (gastroesophageal reflux disease)    Rolaids as needed  . High cholesterol   . History of MI (myocardial infarction)   . Hypertension    med. dosage increased 04/2016; has been on BP med. x 10 yrs.  . Incarcerated  umbilical hernia 40/9811  . Inguinal hernia 05/2016   bilateral   . Insulin dependent diabetes mellitus   . Ischemic cardiomyopathy    a. 03/2015 EF 40-45% by LV gram.  . Left leg cellulitis 10/08/2016  . PAF (paroxysmal atrial fibrillation) ([facility])    on home amio+metoprolol, plavix  . Rheumatoid arthritis(714.0)      Significant Hospital Events   ICU admission  Consults:  Pulmonary critical care Cardiology Nephrology Hospitalist  Procedures:  05/28/2020: HD catheter  Significant Diagnostic Tests:    Micro Data:    Antimicrobials:     Interim history/subjective:   Resting comfortable in chair on CVVHD.  Still requiring nasal cannula O2 supplementation.  Objective   Blood pressure 106/84, pulse 87, temperature 97.8 F (36.6 C), temperature source Oral, resp. rate (!) 23, height 6' (1.829 m), weight 107.5 kg, SpO2 94 %.        Intake/Output Summary (Last 24 hours) at 05/29/2020 0724 Last data filed at 05/29/2020 9147 Gross per 24 hour  Intake 1180.29 ml  Output 4208 ml  Net -3027.71 ml   Filed Weights   05/27/20 0500 05/28/20 0600 05/29/20 0500  Weight: 112.7 kg 111.6 kg 107.5 kg    Examination: General: Elderly male sitting in chair less short of breath than yesterday, tolerating CVVHD HENT: NCAT, sclera clear, tracking appropriately Lungs: Diminished breath sounds bilateral bases no crackles no wheeze Cardiovascular: Regular rhythm, S1-S2 Abdomen: Soft nontender mildly distended Extremities: Bilateral lower extremity edema Neuro: Alert following commands GU: Deferred  Pressure Injury 07/04/19 Buttocks Right;Upper Stage II -  Partial thickness loss of dermis presenting as a shallow open ulcer with a red, pink wound bed without slough. (Active)  07/04/19 1429  Location: Buttocks  Location Orientation: Right;Upper  Staging: Stage II -  Partial thickness loss of dermis presenting as a shallow open ulcer with a red, pink wound bed without slough.  Wound  Description (Comments):   Present on Admission: Yes     Pressure Injury 05/26/20 Foot Left;Lateral Deep Tissue Pressure Injury - Purple or maroon localized area of discolored intact skin or blood-filled blister due to damage of underlying soft tissue from pressure and/or shear. dark purple circular, 5cm x 5 c (Active)  05/26/20 0820  Location: Foot  Location Orientation: Left;Lateral  Staging: Deep Tissue Pressure Injury - Purple or maroon localized area of discolored intact skin or blood-filled blister due to damage of underlying soft tissue from pressure and/or shear.  Wound Description (Comments): dark purple circular, 5cm x 5 cm  Present on Admission: Yes    Resolved Hospital Problem list     Assessment & Plan:   Acute hypoxemic respiratory failure requiring high flow nasal cannula. Bilateral pulmonary edema, vascular congestion Bilateral pleural effusions Positive cumulative fluid balance Plan: Continue CVVHD for volume removal If pleural effusions do not resolve can offer thora in future  Acute on chronic systolic diastolic heart failure Pericardial effusion w/ tamponade status post pericardiocentesis Plan: Volume removal with cvvhd  Once euvolemic can start GDT HF regimen   AKI on CKD stage IIIb. Hyperkalemia Hyponatremia Plan: CVVHD Monitor pressures. Doesn't need pressor support at this time.   A. 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   Best practice:  Diet: Regular Pain/Anxiety/Delirium protocol (if indicated): Not apple VAP protocol (if indicated): N/A DVT prophylaxis: Heparin  GI prophylaxis: None PPI Glucose control: SSI Mobility: Bedrest Code Status: Full code Family Communication: we will update family, they are at bedside  Disposition: ICU.  Labs   CBC: Recent Labs  Lab  06/02/2020 0910 05/20/2020 1214 05/30/2020 1215 05/26/20 0054 05/27/20 0548 05/28/20 0030 05/29/20 0343  WBC 11.0*  --   --  14.6* 12.5* 10.8* 8.5  HGB 14.3   < > 14.6 13.2 14.1 14.1 13.3  HCT 44.0   < > 43.0 40.8 43.1 43.2 41.4  MCV 94.8  --   --  94.0 96.2 94.5 93.9  PLT 268  --   --  229 216 264 177   < > = values in this interval not displayed.    Basic Metabolic Panel: Recent Labs  Lab 05/23/20 0908 05/23/20 0908 05/24/20 1005 06/02/2020 0910 05/26/20 0054 05/27/20 0548 05/28/20 0030 05/28/20 1623 05/29/20 0343  NA 128*   < > 130*   < > 132* 133* 134* 133* 134*  K 4.5   < > 4.7   < > 5.4* 5.3* 4.0 4.2 4.2  CL 94*   < > 94*   < > 98 97* 95* 94* 99  CO2 25   < > 25   < > 26 26 29 25 26   GLUCOSE 273*   < > 247*   < > 333* 229* 131* 236* 170*  BUN 90*   < > 105*   < > 114* 119* 120* 123* 78*  CREATININE 1.99*   < > 2.09*   < > 2.18* 2.20* 1.98* 1.76* 1.31*  CALCIUM 8.4*   < > 8.8*   < > 8.6* 8.6* 8.9 8.6* 8.5*  MG 2.5*  --  2.6*  --  2.6* 2.6*  --   --  2.5*  PHOS  --   --   --   --   --   --   --  4.2 2.9   < > = values in this interval not displayed.   GFR: Estimated Creatinine Clearance: 69.3 mL/min (A) (by C-G formula based on SCr of 1.31 mg/dL (H)). Recent Labs  Lab 05/26/20 0054 05/27/20 0548 05/28/20 0030 05/29/20 0343  WBC 14.6* 12.5* 10.8* 8.5    Liver Function Tests: Recent Labs  Lab 05/24/20 1005 05/24/20 1005 05/26/20 0054 05/27/20 0548 05/28/20 0030 05/28/20 1623 05/29/20 0343  AST 125*  --  115* 130* 92*  --  79*  ALT 227*  --  230* 226* 220*  --  179*  ALKPHOS 134*  --  149* 154* 180*  --  157*  BILITOT 0.5  --  0.6 0.4 0.8  --  0.8  PROT 6.3*  --  5.6* 5.9* 6.2*  --  6.1*  ALBUMIN 3.1*   < > 2.6* 2.8* 2.9* 2.8* 2.9*   < > = values in this interval not displayed.   No results for input(s): LIPASE, AMYLASE in the last 168 hours. No results for input(s): AMMONIA in the last 168 hours.  ABG    Component Value Date/Time   PHART 7.403  05/19/2019 1943   PCO2ART 52.4 (H) 05/19/2019 1943   PO2ART 390.0 (H) 05/19/2019 1943   HCO3 26.3 05/16/2020 1215   TCO2 28 05/23/2020 1215   ACIDBASEDEF 3.0 (H) 05/19/2020 1215   O2SAT 64.0 05/20/2020 1215     Coagulation Profile: No results for input(s): INR, PROTIME in the last 168 hours.  Cardiac Enzymes: No results for input(s): CKTOTAL, CKMB, CKMBINDEX, TROPONINI in the last 168 hours.  HbA1C: Hgb A1c MFr Bld  Date/Time Value Ref Range Status  05/20/2020 06:29 AM 7.3 (H) 4.8 - 5.6 % Final    Comment:    (NOTE) Pre diabetes:          5.7%-6.4%  Diabetes:              >6.4%  Glycemic control for   <7.0% adults with diabetes   07/03/2019 11:13 PM 6.7 (H) 4.8 - 5.6 % Final    Comment:    (NOTE) Pre diabetes:          5.7%-6.4% Diabetes:              >6.4% Glycemic control for   <7.0% adults with diabetes     CBG: Recent Labs  Lab 05/28/20 0620 05/28/20 1146 05/28/20 1605 05/28/20 2210 05/29/20 0622  GLUCAP 149* 278* 227* 197* 177*     Past Medical History  He,  has a past medical history of Anxiety, CHF (congestive heart failure) ([place]), Collagen vascular disease ([place]), COPD (chronic obstructive pulmonary disease) ([place]), Coronary artery disease, COVID-19, Full dentures, GERD (gastroesophageal reflux disease), High cholesterol, History of MI (myocardial infarction), Hypertension, Incarcerated umbilical hernia (00/1749), Inguinal hernia (05/2016), Insulin dependent diabetes mellitus, Ischemic cardiomyopathy, Left leg cellulitis (10/08/2016), PAF (paroxysmal atrial fibrillation) ([place]), and Rheumatoid arthritis(714.0).   Surgical History    Past Surgical History:  Procedure Laterality Date  . CARDIAC CATHETERIZATION N/A 02/01/2015   Procedure: Left Heart Cath and Coronary Angiography;  Surgeon: Belva Crome, MD;  Location: [place] [facility];  Service: Cardiovascular;  Laterality: N/A;  . CARDIAC CATHETERIZATION N/A 03/22/2015   Procedure: Left Heart Cath and  Coronary Angiography;  Surgeon: Leonie Man, MD;  Location: [place] [facility];  Service: Cardiovascular;  Laterality: N/A;  . CARDIAC CATHETERIZATION  09/05/2002; 03/09/2003; 04/10/2005;06/02/2005; 05/09/2006; 02/09/2007; 03/01/2007  . COLONOSCOPY  2008   Dr. Gala Romney: diverticulosis, hyperplastic polyp.  . CORONARY ANGIOPLASTY  11/14/2012; 02/01/2015  . CORONARY ANGIOPLASTY WITH STENT PLACEMENT  05/16/2012   "1; makes total ~ 4"  . CORONARY STENT INTERVENTION N/A 06/06/2017   Procedure: CORONARY STENT INTERVENTION;  Surgeon: Belva Crome, MD;  Location: [place] [facility];  Service: Cardiovascular;  Laterality: N/A;  . INSERTION OF MESH Bilateral 05/24/2016   Procedure: INSERTION OF MESH;  Surgeon: Coralie Keens, MD;  Location: [place];  Service: General;  Laterality: Bilateral;  . LAPAROSCOPIC CHOLECYSTECTOMY    . LAPAROSCOPIC INGUINAL HERNIA WITH UMBILICAL HERNIA Bilateral 05/24/2016   Procedure: BILATERAL LAPAROSCOPIC INGUINAL HERNIA REPAIR WITH MESH AND UMBILICAL HERNIA REPAIR WITH MESH;  Surgeon: Coralie Keens, MD;  Location: [facility];  Service: General;  Laterality: Bilateral;  . LEFT HEART CATH AND CORONARY ANGIOGRAPHY N/A 06/06/2017   Procedure: LEFT HEART CATH AND CORONARY ANGIOGRAPHY;  Surgeon: Belva Crome, MD;  Location: [place] [facility];  Service: Cardiovascular;  Laterality: N/A;  . LEFT HEART CATHETERIZATION WITH CORONARY ANGIOGRAM N/A 12/22/2011   Procedure: LEFT HEART CATHETERIZATION WITH CORONARY ANGIOGRAM;  Surgeon: Troy Sine, MD;  Location: [facility] CATH LAB;  Service: Cardiovascular;  Laterality: N/A;  . LEFT HEART CATHETERIZATION WITH CORONARY ANGIOGRAM Bilateral 05/16/2012   Procedure: LEFT HEART CATHETERIZATION WITH CORONARY ANGIOGRAM;  Surgeon: Lorretta Harp, MD;  Location: [facility] CATH LAB;  Service: Cardiovascular;  Laterality: Bilateral;  . LEFT HEART CATHETERIZATION WITH CORONARY ANGIOGRAM N/A 11/14/2012   Procedure: LEFT HEART  CATHETERIZATION WITH CORONARY ANGIOGRAM;  Surgeon: Troy Sine, MD;  Location: [facility] CATH LAB;  Service: Cardiovascular;  Laterality: N/A;  . LEFT HEART CATHETERIZATION WITH CORONARY ANGIOGRAM N/A 09/01/2013   Procedure: LEFT HEART CATHETERIZATION WITH CORONARY ANGIOGRAM;  Surgeon: Leonie Man, MD;  Location: [facility] CATH LAB;  Service: Cardiovascular;  Laterality: N/A;  . [place]; 1973; 1985   x 3  . NM MYOCAR PERF WALL MOTION  08/30/2009   No significant ischemia  . OPEN REDUCTION INTERNAL FIXATION (ORIF) DISTAL RADIAL FRACTURE Right 12/10/2016   Procedure: OPEN REDUCTION INTERNAL FIXATION (ORIF) DISTAL RADIAL FRACTURE;  Surgeon: Iran Planas, MD;  Location: [place];  Service: Orthopedics;  Laterality: Right;  . PERCUTANEOUS CORONARY INTERVENTION-BALLOON ONLY  12/22/2011   Procedure: PERCUTANEOUS CORONARY INTERVENTION-BALLOON ONLY;  Surgeon: Troy Sine, MD;  Location: [facility] CATH LAB;  Service: Cardiovascular;;  . PERCUTANEOUS CORONARY INTERVENTION-BALLOON ONLY  11/14/2012   Procedure: PERCUTANEOUS CORONARY INTERVENTION-BALLOON ONLY;  Surgeon: Troy Sine, MD;  Location: [facility] CATH LAB;  Service: Cardiovascular;;  . PERCUTANEOUS CORONARY STENT INTERVENTION (PCI-S)  05/16/2012   Procedure: PERCUTANEOUS CORONARY STENT INTERVENTION (PCI-S);  Surgeon: Lorretta Harp, MD;  Location: [facility] CATH LAB;  Service: Cardiovascular;;  . PERCUTANEOUS CORONARY STENT INTERVENTION (PCI-S)  09/01/2013   Procedure: PERCUTANEOUS CORONARY STENT INTERVENTION (PCI-S);  Surgeon: Leonie Man, MD;  Location: [facility] CATH LAB;  Service: Cardiovascular;;  . PERICARDIOCENTESIS N/A 05/29/2020   Procedure: PERICARDIOCENTESIS;  Surgeon: Belva Crome, MD;  Location: [place] [facility];  Service: Cardiovascular;  Laterality: N/A;  . RIGHT HEART CATH N/A 05/29/2020   Procedure: RIGHT HEART CATH;  Surgeon:  Belva Crome, MD;  Location: [place] [facility];  Service: Cardiovascular;  Laterality: N/A;     Social History    reports that he has been smoking cigarettes. He has been smoking about 1.00 pack per day. He has never used smokeless tobacco. He reports that he does not drink alcohol and does not use drugs.   Family History   His family history includes Colon cancer in his brother and mother; Heart disease in his father and mother; Hypertension in his mother; Lung cancer in his mother.   Allergies Allergies  Allergen Reactions  . Ivp Dye [Iodinated Diagnostic Agents] Itching and Rash     Home Medications  Prior to Admission medications   Medication Sig Start Date End Date Taking? Authorizing Provider  acarbose (PRECOSE) 100 MG tablet Take 1 tablet (100 mg total) by mouth 3 (three) times daily with meals. 06/06/19  Yes Love, Ivan Anchors, PA-C  acetaminophen (TYLENOL) 650 MG CR tablet Take 650 mg by mouth every 8 (eight) hours as needed for pain.   Yes [provider]  aspirin EC 81 MG tablet Take 1 tablet (81 mg total) by mouth daily. 02/12/20  Yes Croitoru, Mihai, MD  atorvastatin (LIPITOR) 80 MG tablet Take 1 tablet (80 mg total) by mouth daily at 6 PM. 12/30/19  Yes Croitoru, Mihai, MD  clopidogrel (PLAVIX) 75 MG tablet Take 1 tablet (75 mg total) by mouth daily. 10/07/19  Yes Kroeger, Daleen Snook M., PA-C  fish oil-omega-3 fatty acids 1000 MG capsule Take 1 g by mouth daily.    Yes [provider]  fluticasone Asencion Islam) 50 MCG/ACT nasal spray  11/19/19  Yes [provider]  folic acid (FOLVITE) 1 MG tablet Take 1 tablet (1 mg total) by mouth daily. 06/06/19  Yes Love, Ivan Anchors, PA-C  furosemide (LASIX) 40 MG tablet Take 2 tablets (80 mg total) by mouth daily. 04/12/20  Yes Croitoru, Mihai, MD  gabapentin (NEURONTIN) 300 MG capsule Take 300 mg by mouth 3 (three) times daily.   Yes [provider]  glimepiride (AMARYL) 2 MG tablet Take 1 tablet (2 mg total) by mouth daily with breakfast. 06/06/19  Yes Love, Pamela S, PA-C  Insulin Glargine (TOUJEO SOLOSTAR Vincent) Inject 25 Units into the  skin at bedtime. 25 units    Yes [provider]  isosorbide mononitrate (IMDUR) 60 MG 24 hr tablet Take 1 tablet (60 mg total) by mouth daily. 02/12/20 02/06/21 Yes Croitoru, Mihai, MD  losartan (COZAAR) 25 MG tablet Take 0.5 tablets (12.5 mg total) by mouth daily. 09/29/19  Yes Kroeger, Daleen Snook M., PA-C  metoprolol tartrate (LOPRESSOR) 25 MG tablet Take 1 tablet (25 mg total) by mouth 2 (two) times daily. 09/29/19  Yes Kroeger, Daleen Snook M., PA-C  mometasone-formoterol (DULERA) 200-5 MCG/ACT AERO Inhale 2 puffs into the lungs 2 (two) times daily. 06/06/19  Yes Love, Ivan Anchors, PA-C  nitroGLYCERIN (NITROSTAT) 0.4 MG SL tablet Place 1 tablet (0.4 mg total) under the tongue every 5 (five) minutes x 3 doses as needed for chest pain. 04/24/16  Yes Croitoru, Mihai, MD  potassium chloride SA (KLOR-CON) 20 MEQ tablet Take 1 tablet (20 mEq total) by mouth 2 (two) times daily. 02/03/20  Yes Kroeger, Daleen Snook M., PA-C  primidone (MYSOLINE) 50 MG tablet Take 100 mg by mouth at bedtime. 09/18/19  Yes [provider]  ALPRAZolam Duanne Moron) 0.5 MG tablet Take 1 tablet (0.5 mg total) by mouth 3 (three) times daily as needed for anxiety or sleep. Patient  not taking: Reported on 06/09/2020 06/06/19   Love, Ivan Anchors, PA-C  collagenase (SANTYL) ointment Apply topically daily. And cover with damp to dry dressing daily. Change more frequently if soiled 06/06/19   Love, Ivan Anchors, PA-C  terbinafine (LAMISIL) 250 MG tablet Take 250 mg by mouth daily. Patient not taking: Reported on 05/18/2020 11/26/19   [provider]  vitamin C (VITAMIN C) 250 MG tablet Take 1 tablet (250 mg total) by mouth 2 (two) times daily. Patient not taking: Reported on 06/03/2020 06/06/19   Bary Leriche, PA-C     This patient is critically ill with multiple organ system failure; which, requires frequent high complexity decision making, assessment, support, evaluation, and titration of therapies. This was completed through the application of  advanced monitoring technologies and extensive interpretation of multiple databases. During this encounter critical care time was devoted to patient care services described in this note for 33 minutes.  Garner Nash, DO [place] [facility] 05/29/2020 10:39 AM

## 2020-05-29 NOTE — Progress Notes (Signed)
[place] for heparin Indication: atrial fibrillation  Allergies  Allergen Reactions  . Ivp Dye [Iodinated Diagnostic Agents] Itching and Rash    Patient Measurements: Height: 6' (182.9 cm) Weight: 107.5 kg (236 lb 15.9 oz) IBW/kg (Calculated) : 77.6 Heparin Dosing Weight: 103  Vital Signs: Temp: 95.9 F (35.5 C) (09/18 1927) Temp Source: Oral (09/18 1927) BP: 122/85 (09/18 2000) Pulse Rate: 79 (09/18 2000)  Labs: Recent Labs    05/27/20 0548 05/27/20 0548 05/28/20 0030 05/28/20 0030 05/28/20 1623 05/28/20 2311 05/28/20 2350 05/29/20 0343 05/29/20 1011 05/29/20 1541 05/29/20 1921  HGB 14.1   < > 14.1  --   --   --   --  13.3  --   --   --   HCT 43.1  --  43.2  --   --   --   --  41.4  --   --   --   PLT 216  --  264  --   --   --   --  177  --   --   --   APTT  --   --   --   --   --   --  116*  --  37*  --  52*  HEPARINUNFRC  --   --   --   --   --  1.09*  --   --  0.22*  --  0.39  CREATININE 2.20*   < > 1.98*   < > 1.76*  --   --  1.31*  --  1.21  --    < > = values in this interval not displayed.    Estimated Creatinine Clearance: 75.1 mL/min (by C-G formula based on SCr of 1.21 mg/dL).   Assessment: 82 yoM on apixaban PTA for hx AFib admitted with pericardial tamponade s/p pericardiocentesis. Anticoagulation has been held, drain now removed 9/16. Pharmacy asked to restart IV heparin with no bolus, will target lower goal. Last dose of apixaban given 9/13 a.m.   Heparin level and aPTT now below goal. No issues with line or bleeding reported per RN.   Heparin drip 1200 uts/hr HL 0.39 at goal   Goal of Therapy:  Heparin level 0.3-0.5 units/ml; PTT 66-84 sec Monitor platelets by anticoagulation protocol: Yes   Plan:  Continue  IV heparin  1200 units/hr. -Daily heparin level and CBC.    Bonnita Nasuti Pharm.D. CPP, BCPS Clinical Pharmacist [phone] 05/29/2020 8:59 PM      [facility] pharmacy phone numbers are listed on  [email or web address]

## 2020-05-29 NOTE — Progress Notes (Signed)
[place] for heparin Indication: atrial fibrillation  Allergies  Allergen Reactions  . Ivp Dye [Iodinated Diagnostic Agents] Itching and Rash    Patient Measurements: Height: 6' (182.9 cm) Weight: 111.6 kg (246 lb 0.5 oz) IBW/kg (Calculated) : 77.6 Heparin Dosing Weight: 103  Vital Signs: Temp: 93.9 F (34.4 C) (09/17 2348) Temp Source: Oral (09/17 2348) BP: 109/74 (09/18 0000) Pulse Rate: 78 (09/18 0000)  Labs: Recent Labs    05/26/20 0054 05/26/20 0054 05/27/20 0548 05/28/20 0030 05/28/20 1623 05/28/20 2311 05/28/20 2350  HGB 13.2   < > 14.1 14.1  --   --   --   HCT 40.8  --  43.1 43.2  --   --   --   PLT 229  --  216 264  --   --   --   APTT  --   --   --   --   --   --  116*  HEPARINUNFRC  --   --   --   --   --  1.09*  --   CREATININE 2.18*   < > 2.20* 1.98* 1.76*  --   --    < > = values in this interval not displayed.    Estimated Creatinine Clearance: 52.5 mL/min (A) (by C-G formula based on SCr of 1.76 mg/dL (H)).   Assessment: 55 yoM on apixaban PTA for hx AFib admitted with pericardial tamponade s/p pericardiocentesis. Anticoagulation has been held, drain now removed 9/16. Pharmacy asked to restart IV heparin with no bolus, will target lower goal. Last dose of apixaban given 9/13 a.m.   Heparin level 1.09, PTT 116 sec (both above goal but not correlating yet - apixaban may still be affecting heparin level). No issues with line or bleeding reported per RN.  Goal of Therapy:  Heparin level 0.3-0.5 units/ml; PTT 66-84 sec Monitor platelets by anticoagulation protocol: Yes   Plan:  -Hold heparin x 1 hour -Restart heparin at 950 units/hr -F/u 8hr heparin level and PTT  Sherlon Handing, PharmD, BCPS Please see [email or web address] for complete clinical pharmacist phone list 05/29/2020

## 2020-05-29 NOTE — Progress Notes (Signed)
Progress Note  Patient Name: Robert [name] Date of Encounter: 05/29/2020  [facility] Cardiologist: Sanda Klein, MD   Subjective   Ongoing SOB but improving.   Inpatient Medications    Scheduled Meds: . amiodarone  200 mg Oral BID  . Chlorhexidine Gluconate Cloth  6 each Topical Daily  . dextromethorphan-guaiFENesin  1 tablet Oral BID  . fluticasone  1 spray Each Nare Daily  . folic acid  1 mg Oral Daily  . gabapentin  300 mg Oral TID  . influenza vaccine adjuvanted  0.5 mL Intramuscular Tomorrow-1000  . insulin aspart  0-20 Units Subcutaneous TID WC  . insulin aspart  6 Units Subcutaneous TID WC  . insulin glargine  10 Units Subcutaneous Daily  . ipratropium-albuterol  3 mL Nebulization Q6H  . melatonin  3 mg Oral QHS  . metoprolol tartrate  25 mg Oral BID  . mometasone-formoterol  2 puff Inhalation BID  . primidone  100 mg Oral QHS  . sodium chloride flush  10-40 mL Intracatheter Q12H  . sodium chloride flush  3 mL Intravenous Q12H  . sodium chloride flush  3 mL Intravenous Q12H   Continuous Infusions: .  prismasol BGK 4/2.5 500 mL/hr at 05/29/20 0246  .  prismasol BGK 4/2.5 300 mL/hr at 05/28/20 1635  . sodium chloride    . sodium chloride    . heparin 950 Units/hr (05/29/20 0900)  . prismasol BGK 4/2.5 1,800 mL/hr at 05/29/20 0402   PRN Meds: sodium chloride, sodium chloride, acetaminophen, heparin, Muscle Rub, ondansetron (ZOFRAN) IV, oxyCODONE, sodium chloride flush, sodium chloride flush, sodium chloride flush   Vital Signs    Vitals:   05/29/20 0830 05/29/20 0900 05/29/20 0930 05/29/20 1000  BP: 100/62 109/74 119/76 122/67  Pulse: 89 93 93 93  Resp: (!) 22 (!) 21 (!) 23 (!) 26  Temp:      TempSrc:      SpO2: 95% 92% 95% 95%  Weight:      Height:        Intake/Output Summary (Last 24 hours) at 05/29/2020 1018 Last data filed at 05/29/2020 1000 Gross per 24 hour  Intake 1089.42 ml  Output 3615 ml  Net -2525.58 ml   Last 3 Weights  05/29/2020 05/28/2020 05/27/2020  Weight (lbs) 236 lb 15.9 oz 246 lb 0.5 oz 248 lb 7.3 oz  Weight (kg) 107.5 kg 111.6 kg 112.7 kg      Telemetry    Rate controlled afib - Personally Reviewed  ECG    n/a- Personally Reviewed  Physical Exam   GEN: No acute distress.   Neck: No JVD Cardiac: irreg, no murmurs, rubs, or gallops.  Respiratory: Clear to auscultation bilaterally. GI: Soft, nontender, non-distended  MS: No edema; No deformity. Neuro:  Nonfocal  Psych: Normal affect   Labs    High Sensitivity Troponin:   Recent Labs  Lab 05/16/20 1228 05/16/20 1436 06/04/2020 1310 05/26/2020 1653  TROPONINIHS 10 10 9 10       Chemistry Recent Labs  Lab 05/27/20 0548 05/27/20 0548 05/28/20 0030 05/28/20 1623 05/29/20 0343  NA 133*   < > 134* 133* 134*  K 5.3*   < > 4.0 4.2 4.2  CL 97*   < > 95* 94* 99  CO2 26   < > 29 25 26   GLUCOSE 229*   < > 131* 236* 170*  BUN 119*   < > 120* 123* 78*  CREATININE 2.20*   < > 1.98* 1.76* 1.31*  CALCIUM 8.6*   < > 8.9 8.6* 8.5*  PROT 5.9*  --  6.2*  --  6.1*  ALBUMIN 2.8*   < > 2.9* 2.8* 2.9*  AST 130*  --  92*  --  79*  ALT 226*  --  220*  --  179*  ALKPHOS 154*  --  180*  --  157*  BILITOT 0.4  --  0.8  --  0.8  GFRNONAA 30*   < > 34* 39* 56*  GFRAA 35*   < > 39* 45* >60  ANIONGAP 10   < > 10 14 9    < > = values in this interval not displayed.     Hematology Recent Labs  Lab 05/27/20 0548 05/28/20 0030 05/29/20 0343  WBC 12.5* 10.8* 8.5  RBC 4.48 4.57 4.41  HGB 14.1 14.1 13.3  HCT 43.1 43.2 41.4  MCV 96.2 94.5 93.9  MCH 31.5 30.9 30.2  MCHC 32.7 32.6 32.1  RDW 14.6 14.4 14.2  PLT 216 264 177    BNPNo results for input(s): BNP, PROBNP in the last 168 hours.   DDimer No results for input(s): DDIMER in the last 168 hours.   Radiology    DG CHEST PORT 1 VIEW  Result Date: 05/28/2020 CLINICAL DATA:  Central line placement. EXAM: PORTABLE CHEST 1 VIEW COMPARISON:  May 28, 2020 FINDINGS: A new left-sided central  line is identified. The distal tips of the double lumen catheter are in the region of central SVC. No pneumothorax. Bilateral pulmonary infiltrates and a probable right-sided pleural effusion are stable. No other changes. IMPRESSION: No pneumothorax after left central line placement. The distal tip of the new left central line is near the caval atrial junction. Persistent small right effusion and bilateral pulmonary infiltrates. Electronically Signed   By: Dorise Bullion III M.D   On: 05/28/2020 15:07   DG CHEST PORT 1 VIEW  Result Date: 05/28/2020 CLINICAL DATA:  Acute respiratory failure with hypoxia. Follow-up exam. EXAM: PORTABLE CHEST 1 VIEW COMPARISON:  06/02/2020 and 05/16/2020. FINDINGS: There is increased opacity at the right lung base compared to the previous exam, with opacity no obscuring the hemidiaphragm. Lungs demonstrate persistent bilateral interstitial thickening with hazy airspace type opacity in the perihilar to lower lungs. No pneumothorax. IMPRESSION: 1. Interval worsening of lung aeration on the right with increased right lung base opacity. This is most likely due to an enlarged pleural effusion. 2. Persistent interstitial and hazy airspace opacities, which may reflect multifocal infection or pulmonary edema. Additional lung base opacity on the left is likely a combination of a small effusion and atelectasis, also stable. Electronically Signed   By: Lajean Manes M.D.   On: 05/28/2020 08:39   VAS [place] ABI WITH/WO TBI  Result Date: 05/28/2020 LOWER EXTREMITY DOPPLER STUDY Indications: Cool left foot. High Risk Factors: Hypertension, hyperlipidemia. Other Factors: PAD: he has known hi-grade stenosis in L SFA and popliteal artery                occlusion with reconstitution via collaterals per [place] in 2018.  Limitations: Today's exam was limited due to patient positioning, patient unable              to lie flat and an open wound. Comparison Study: No prior studies. Performing Technologist:  Carlos Levering RVT  Examination Guidelines: A complete evaluation includes at minimum, Doppler waveform signals and systolic blood pressure reading at the level of bilateral brachial, anterior tibial, and posterior tibial arteries, when vessel  segments are accessible. Bilateral testing is considered an integral part of a complete examination. Photoelectric Plethysmograph (PPG) waveforms and toe systolic pressure readings are included as required and additional duplex testing as needed. Limited examinations for reoccurring indications may be performed as noted.  ABI Findings: +--------+------------------+-----+---------+--------------+ Right   Rt Pressure (mmHg)IndexWaveform Comment        +--------+------------------+-----+---------+--------------+ Brachial                                Restricted arm +--------+------------------+-----+---------+--------------+ PTA     130               1.15 triphasic               +--------+------------------+-----+---------+--------------+ DP      126               1.12 biphasic                +--------+------------------+-----+---------+--------------+ +--------+------------------+-----+----------+-------+ Left    Lt Pressure (mmHg)IndexWaveform  Comment +--------+------------------+-----+----------+-------+ [id number]                    triphasic         +--------+------------------+-----+----------+-------+ PTA     76                0.67 monophasic        +--------+------------------+-----+----------+-------+ DP      59                0.52 monophasic        +--------+------------------+-----+----------+-------+ +-------+-----------+-----------+------------+------------+ ABI/TBIToday's ABIToday's TBIPrevious ABIPrevious TBI +-------+-----------+-----------+------------+------------+ Right  1.15                                           +-------+-----------+-----------+------------+------------+ Left   0.67                                            +-------+-----------+-----------+------------+------------+ The values are likely inaccurate due to patient positioning, and cuff placement. The patient's head was elevated to 30 due to 02 desaturation with anything lower. The left ankle cuff was placed just distal to the knee due to an open wound on the anterior, mid/distal calf. Unable to obtain toe pressures due to the patient's 02 desaturation.  Summary: Right: Resting right ankle-brachial index is within normal range. No evidence of significant right lower extremity arterial disease. Left: Resting left ankle-brachial index indicates moderate left lower extremity arterial disease.  *See table(s) above for measurements and observations.  Electronically signed by Servando Snare MD on 05/28/2020 at 3:40:55 PM.    Final     Cardiac Studies    Patient Profile     69 y.o. 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 He was previously on anticoagulation for PAF but this was discontinued due to f/u heart monitoring showing NSR. He presented to [facility] with worsening SOB, weight gain and edema. Originally seen in ED 05/16/20 and sent home after Lasix but continued to worsen prompting return to ED 05/28/2020. On arrival found to be hypoxic and with development of coarse atrial  fib versus atypical atrial flutter. He was also found to have new LV dysfunction with LVEF 35%, moderate-large pericardial effusion, and AKI on CKD stage III with poor UOP.  Assessment & Plan    1. Pericardial effusion/Tamponade - s/p pericardiocentesis, 1L of grossly bloody fluid - pericardial drain removed - etiology remains unclear, effusion occurred prior to starting anticoagution - plan for limited echo Monday, if significant recurrence would need window - likely plan for CT chest once medically recovered to  eval for potential malignancy  2. Acute systolic HF - presented massively fluid overloaded. Had tamponade physiology but also new systolic dysfunction LVEF 35-40% - diuresis initially very limited, after pericardiocentesis output did increase. Followed by renal, required very high dose diuretics - diuresis limited by significant renal dysfunction.  - started HD, fluid removal per nephrology - medical therapy limited by renal dysfunciton, avoiding ACE/ARB/ARNI, also soft bp's at times. On lopressor 25mg  bid, intermittent low bp's have not consolidated to long acting toprol yet  3. Afib - from notes episode in 03/2019 in setting of covid - no recurrence by outpatient monitor, anticoag was stopped - recurrent afib this admission - on oral amio, lopressor.  - was off anticoag due to hemorragic pericardial effusion, now back on hep gtt. COntinue hep gtt in ICU setting.   - rates controlled on current regimen   4. History of CAD - prior interventions - new drop in LVEF, given renal function would not pursue cath - no signs of ACS   5. AKI on CKD - plans for HD per nephrology   6. PAD - per vascular, followed as inpatient   he has known hi-grade stenosis in L SFA and popliteal artery occlusion with reconstitution via collaterals per [place] in 2018 For questions or updates, please contact [place] Please consult [email or web address] for contact info under        Signed, Carlyle Dolly, MD  05/29/2020, 10:18 AM

## 2020-05-30 LAB — RENAL FUNCTION PANEL
Albumin: 2.9 g/dL — ABNORMAL LOW (ref 3.5–5.0)
Anion gap: 6 (ref 5–15)
BUN: 38 mg/dL — ABNORMAL HIGH (ref 8–23)
CO2: 27 mmol/L (ref 22–32)
Calcium: 8.4 mg/dL — ABNORMAL LOW (ref 8.9–10.3)
Chloride: 102 mmol/L (ref 98–111)
Creatinine, Ser: 1.32 mg/dL — ABNORMAL HIGH (ref 0.61–1.24)
GFR calc Af Amer: >60 mL/min (ref >60)
GFR calc non Af Amer: 55 mL/min — ABNORMAL LOW (ref >60)
Glucose, Bld: 127 mg/dL — ABNORMAL HIGH (ref 70–99)
Phosphorus: 2.3 mg/dL — ABNORMAL LOW (ref 2.5–4.6)
Potassium: 4.8 mmol/L (ref 3.5–5.1)
Sodium: 135 mmol/L (ref 135–145)

## 2020-05-30 LAB — COMPREHENSIVE METABOLIC PANEL
ALT: 179 U/L — ABNORMAL HIGH (ref 0–44)
AST: 76 U/L — ABNORMAL HIGH (ref 15–41)
Albumin: 2.9 g/dL — ABNORMAL LOW (ref 3.5–5.0)
Alkaline Phosphatase: 172 U/L — ABNORMAL HIGH (ref 38–126)
Anion gap: 9 (ref 5–15)
BUN: 51 mg/dL — ABNORMAL HIGH (ref 8–23)
CO2: 25 mmol/L (ref 22–32)
Calcium: 8.2 mg/dL — ABNORMAL LOW (ref 8.9–10.3)
Chloride: 101 mmol/L (ref 98–111)
Creatinine, Ser: 1.49 mg/dL — ABNORMAL HIGH (ref 0.61–1.24)
GFR calc Af Amer: 55 mL/min — ABNORMAL LOW (ref >60)
GFR calc non Af Amer: 48 mL/min — ABNORMAL LOW (ref >60)
Glucose, Bld: 128 mg/dL — ABNORMAL HIGH (ref 70–99)
Potassium: 4.2 mmol/L (ref 3.5–5.1)
Sodium: 135 mmol/L (ref 135–145)
Total Bilirubin: 0.8 mg/dL (ref 0.3–1.2)
Total Protein: 6.4 g/dL — ABNORMAL LOW (ref 6.5–8.1)

## 2020-05-30 LAB — CBC
HCT: 42.3 % (ref 39.0–52.0)
Hemoglobin: 13.5 g/dL (ref 13.0–17.0)
MCH: 30.1 pg (ref 26.0–34.0)
MCHC: 31.9 g/dL (ref 30.0–36.0)
MCV: 94.4 fL (ref 80.0–100.0)
Platelets: 141 10*3/uL — ABNORMAL LOW (ref 150–400)
RBC: 4.48 MIL/uL (ref 4.22–5.81)
RDW: 14.5 % (ref 11.5–15.5)
WBC: 11.3 10*3/uL — ABNORMAL HIGH (ref 4.0–10.5)
nRBC: 0 % (ref 0.0–0.2)

## 2020-05-30 LAB — GLUCOSE, CAPILLARY
Glucose-Capillary: 124 mg/dL — ABNORMAL HIGH (ref 70–99)
Glucose-Capillary: 199 mg/dL — ABNORMAL HIGH (ref 70–99)
Glucose-Capillary: 161 mg/dL — ABNORMAL HIGH (ref 70–99)
Glucose-Capillary: 115 mg/dL — ABNORMAL HIGH (ref 70–99)

## 2020-05-30 LAB — MAGNESIUM: Magnesium: 2.6 mg/dL — ABNORMAL HIGH (ref 1.7–2.4)

## 2020-05-30 LAB — PHOSPHORUS: Phosphorus: 2.8 mg/dL (ref 2.5–4.6)

## 2020-05-30 LAB — HEPARIN LEVEL (UNFRACTIONATED): Heparin Unfractionated: 0.5 IU/mL (ref 0.30–0.70)

## 2020-05-30 MED ORDER — IPRATROPIUM-ALBUTEROL 0.5-2.5 (3) MG/3ML IN SOLN
RESPIRATORY_TRACT | Status: AC
  Filled 2020-05-30: qty 3

## 2020-05-30 MED ORDER — TAMSULOSIN HCL 0.4 MG PO CAPS
0.4000 mg | ORAL_CAPSULE | Freq: Every day | ORAL | Status: DC
  Administered 2020-05-30 – 2020-06-15 (×17): 0.4 mg via ORAL
  Filled 2020-05-30 (×20): qty 1

## 2020-05-30 MED ORDER — LOPERAMIDE HCL 2 MG PO CAPS
2.0000 mg | ORAL_CAPSULE | ORAL | Status: DC | PRN
  Administered 2020-05-30 – 2020-05-31 (×2): 4 mg via ORAL
  Filled 2020-05-30 (×2): qty 2

## 2020-05-30 NOTE — Progress Notes (Signed)
[facility] Progress Note  Name: Robert [name] MRN: [id number] DOB: 1951-06-19   Subjective:  Tolerated ^ UF with CRRT yesterday - net neg 2.4L.  Says feeling breathing is appreciably better.  Able to stand to urinate now and flomax added.  No new issues.  Review of systems: Denies cp  Denies n/v, appetite ok, no dysgeusia   Intake/Output Summary (Last 24 hours) at 05/30/2020 0859 Last data filed at 05/30/2020 0800 Gross per 24 hour  Intake 759.44 ml  Output 3406 ml  Net -2646.56 ml    Vitals:  Vitals:   05/30/20 0700 05/30/20 0734 05/30/20 0800 05/30/20 0826  BP: 108/70  115/65   Pulse: 94  (!) 190   Resp: (!) 23  (!) 29   Temp:  (!) 96.1 F (35.6 C)    TempSrc:  Axillary    SpO2: (!) 88%  94% 94%  Weight:      Height:         Physical Exam:  General adult male in bed chronically ill appearing   HEENT normocephalic atraumatic extraocular movements intact sclera anicteric Neck supple trachea midline Lungs clearer few basilar crackles; WOB appears normal at rest now Heart S1S2 tachy Abdomen soft nontender obese habitus Extremities 3+ edema bilaterally, slightly improved c/w yesterday  Psych normal mood and affect Neuro alert and oriented x3 provides hx and follows commands   Medications reviewed  Labs:  BMP Latest Ref Rng & Units 05/30/2020 05/29/2020 05/29/2020  Glucose 70 - 99 mg/dL 128(H) 170(H) 170(H)  BUN 8 - 23 mg/dL 51(H) 58(H) 78(H)  Creatinine 0.61 - 1.24 mg/dL 1.49(H) 1.21 1.31(H)  BUN/Creat Ratio 10 - 24 - - -  Sodium 135 - 145 mmol/L 135 135 134(L)  Potassium 3.5 - 5.1 mmol/L 4.2 4.5 4.2  Chloride 98 - 111 mmol/L 101 101 99  CO2 22 - 32 mmol/L 25 28 26   Calcium 8.9 - 10.3 mg/dL 8.2(L) 8.3(L) 8.5(L)     Assessment/Plan:   1. AKI on CKD3b  1. AKI 2/2 acute CHF and pericardial effusion.  2. Suboptimal diuresis so started UF with CRRT 9/17 -- currently 100-192mL/hr net neg as tolerated 3. K 4.2, Phos 2.9 - cont  currentfluids  2. AoC sCHF exacerbation - appreciate cardiology.   3. Pericardial effusion s/p paracardiocentesis   4. Acute hypoxic resp failure - volume per CRRT; copd per primary  5. Hyperkalemia - resolved  6. Hyponatremia, hypervolemic - improved   7. DM2 per primary team   8. COPD - decreased resp reserve; per primary  9. AFib, rate control, anticoagulation per primary team   Justin Mend, MD 05/30/2020  8:59 AM

## 2020-05-30 NOTE — Plan of Care (Signed)
  Problem: Education: Goal: Knowledge of General Education information will improve Description: Including pain rating scale, medication(s)/side effects and non-pharmacologic comfort measures Outcome: Progressing   Problem: Health Behavior/Discharge Planning: Goal: Ability to manage health-related needs will improve Outcome: Progressing   Problem: Clinical Measurements: Goal: Ability to maintain clinical measurements within normal limits will improve Outcome: Progressing Goal: Will remain free from infection Outcome: Progressing Goal: Diagnostic test results will improve Outcome: Progressing Goal: Respiratory complications will improve Outcome: Progressing Goal: Cardiovascular complication will be avoided Outcome: Progressing   Problem: Activity: Goal: Risk for activity intolerance will decrease Outcome: Progressing   Problem: Nutrition: Goal: Adequate nutrition will be maintained Outcome: Progressing   Problem: Coping: Goal: Level of anxiety will decrease Outcome: Progressing   Problem: Elimination: Goal: Will not experience complications related to bowel motility Outcome: Progressing   Problem: Pain Managment: Goal: General experience of comfort will improve Outcome: Progressing   Problem: Safety: Goal: Ability to remain free from injury will improve Outcome: Progressing   Problem: Skin Integrity: Goal: Risk for impaired skin integrity will decrease Outcome: Progressing   Problem: Education: Goal: Ability to demonstrate management of disease process will improve Outcome: Progressing Goal: Ability to verbalize understanding of medication therapies will improve Outcome: Progressing   Problem: Cardiac: Goal: Ability to achieve and maintain adequate cardiopulmonary perfusion will improve Outcome: Progressing   Problem: Education: Goal: Understanding of CV disease, CV risk reduction, and recovery process will improve Outcome: Progressing   Problem:  Activity: Goal: Ability to return to baseline activity level will improve Outcome: Progressing   Problem: Cardiovascular: Goal: Ability to achieve and maintain adequate cardiovascular perfusion will improve Outcome: Progressing Goal: Vascular access site(s) Level 0-1 will be maintained Outcome: Progressing   Problem: Health Behavior/Discharge Planning: Goal: Ability to safely manage health-related needs after discharge will improve Outcome: Progressing

## 2020-05-30 NOTE — Progress Notes (Signed)
[facility] for heparin Indication: atrial fibrillation  Allergies  Allergen Reactions  . Ivp Dye [Iodinated Diagnostic Agents] Itching and Rash    Patient Measurements: Height: 6' (182.9 cm) Weight: 104.6 kg (230 lb 9.6 oz) IBW/kg (Calculated) : 77.6 Heparin Dosing Weight: 103  Vital Signs: Temp: 96.4 F (35.8 C) (09/19 1112) Temp Source: Axillary (09/19 1112) BP: 93/79 (09/19 1200) Pulse Rate: 85 (09/19 1100)  Labs: Recent Labs    05/28/20 0030 05/28/20 0030 05/28/20 1623 05/28/20 2311 05/28/20 2350 05/29/20 0343 05/29/20 1011 05/29/20 1541 05/29/20 1921 05/30/20 0416  HGB 14.1   < >  --   --   --  13.3  --   --   --  13.5  HCT 43.2  --   --   --   --  41.4  --   --   --  42.3  PLT 264  --   --   --   --  177  --   --   --  141*  APTT  --   --   --   --  116*  --  37*  --  52*  --   HEPARINUNFRC  --   --    < >   < >  --   --  0.22*  --  0.39 0.50  CREATININE 1.98*   < >   < >  --   --  1.31*  --  1.21  --  1.49*   < > = values in this interval not displayed.    Estimated Creatinine Clearance: 60.2 mL/min (A) (by C-G formula based on SCr of 1.49 mg/dL (H)).   Assessment: 89 yoM on apixaban PTA for hx AFib admitted with pericardial tamponade s/p pericardiocentesis. Anticoagulation has been held, drain now removed 9/16. Pharmacy asked to restart IV heparin with no bolus, will target lower goal. Last dose of apixaban given 9/13 a.m.   Heparin level now at goal. No issues with line or bleeding reported per RN.   Heparin drip 1200 uts/hr.  Goal of Therapy:  Heparin level 0.3-0.5 units/ml; PTT 66-84 sec Monitor platelets by anticoagulation protocol: Yes   Plan:  Continue  IV heparin  1200 units/hr. -Daily heparin level and CBC.  [place] Crane, Roylene Reason, BCCP Clinical Pharmacist  05/30/2020 1:03 PM   [facility] pharmacy phone numbers are listed on [email or web address]     [facility] pharmacy phone numbers are listed on [email or web address]

## 2020-05-30 NOTE — Progress Notes (Signed)
Progress Note  Patient Name: Robert [name] Date of Encounter: 05/30/2020  [facility] Cardiologist: Sanda Klein, MD   Subjective   Some improvement in SOB  Inpatient Medications    Scheduled Meds: . amiodarone  200 mg Oral BID  . Chlorhexidine Gluconate Cloth  6 each Topical Daily  . dextromethorphan-guaiFENesin  1 tablet Oral BID  . fluticasone  1 spray Each Nare Daily  . folic acid  1 mg Oral Daily  . gabapentin  300 mg Oral TID  . influenza vaccine adjuvanted  0.5 mL Intramuscular Tomorrow-1000  . insulin aspart  0-20 Units Subcutaneous TID WC  . insulin aspart  6 Units Subcutaneous TID WC  . insulin glargine  10 Units Subcutaneous Daily  . ipratropium-albuterol  3 mL Nebulization BID  . melatonin  3 mg Oral QHS  . metoprolol tartrate  25 mg Oral BID  . mometasone-formoterol  2 puff Inhalation BID  . primidone  100 mg Oral QHS  . sodium chloride flush  10-40 mL Intracatheter Q12H  . sodium chloride flush  3 mL Intravenous Q12H  . sodium chloride flush  3 mL Intravenous Q12H   Continuous Infusions: .  prismasol BGK 4/2.5 500 mL/hr at 05/30/20 0026  .  prismasol BGK 4/2.5 300 mL/hr at 05/30/20 0031  . sodium chloride    . sodium chloride    . heparin 1,200 Units/hr (05/30/20 0900)  . prismasol BGK 4/2.5 1,800 mL/hr at 05/30/20 0825   PRN Meds: sodium chloride, sodium chloride, acetaminophen, albuterol, heparin, loperamide, Muscle Rub, ondansetron (ZOFRAN) IV, oxyCODONE, sodium chloride flush, sodium chloride flush, sodium chloride flush   Vital Signs    Vitals:   05/30/20 0734 05/30/20 0800 05/30/20 0826 05/30/20 0900  BP:  115/65  107/78  Pulse:  (!) 190  88  Resp:  (!) 29  (!) 25  Temp: (!) 96.1 F (35.6 C)     TempSrc: Axillary     SpO2:  94% 94% 95%  Weight:      Height:        Intake/Output Summary (Last 24 hours) at 05/30/2020 0910 Last data filed at 05/30/2020 0900 Gross per 24 hour  Intake 761.94 ml  Output 3376 ml  Net -2614.06 ml    Last 3 Weights 05/30/2020 05/29/2020 05/28/2020  Weight (lbs) 230 lb 9.6 oz 236 lb 15.9 oz 246 lb 0.5 oz  Weight (kg) 104.6 kg 107.5 kg 111.6 kg      Telemetry    Rate controlled afib - Personally Reviewed  ECG    n/a - Personally Reviewed  Physical Exam   GEN: No acute distress.   Neck: No JVD Cardiac:irreg Respiratory: crackles bilaterla bases GI: Soft, nontender, non-distended  MS: 1-2+ bilateral LE edema; No deformity. Neuro:  Nonfocal  Psych: Normal affect   Labs    High Sensitivity Troponin:   Recent Labs  Lab 05/16/20 1228 05/16/20 1436 05/15/2020 1310 05/17/2020 1653  TROPONINIHS 10 10 9 10       Chemistry Recent Labs  Lab 05/28/20 0030 05/28/20 1623 05/29/20 0343 05/29/20 1541 05/30/20 0416  NA 134*   < > 134* 135 135  K 4.0   < > 4.2 4.5 4.2  CL 95*   < > 99 101 101  CO2 29   < > 26 28 25   GLUCOSE 131*   < > 170* 170* 128*  BUN 120*   < > 78* 58* 51*  CREATININE 1.98*   < > 1.31* 1.21 1.49*  CALCIUM 8.9   < > 8.5* 8.3* 8.2*  PROT 6.2*  --  6.1*  --  6.4*  ALBUMIN 2.9*   < > 2.9* 2.9* 2.9*  AST 92*  --  79*  --  76*  ALT 220*  --  179*  --  179*  ALKPHOS 180*  --  157*  --  172*  BILITOT 0.8  --  0.8  --  0.8  GFRNONAA 34*   < > 56* >60 48*  GFRAA 39*   < > >60 >60 55*  ANIONGAP 10   < > 9 6 9    < > = values in this interval not displayed.     Hematology Recent Labs  Lab 05/28/20 0030 05/29/20 0343 05/30/20 0416  WBC 10.8* 8.5 11.3*  RBC 4.57 4.41 4.48  HGB 14.1 13.3 13.5  HCT 43.2 41.4 42.3  MCV 94.5 93.9 94.4  MCH 30.9 30.2 30.1  MCHC 32.6 32.1 31.9  RDW 14.4 14.2 14.5  PLT 264 177 141*    BNPNo results for input(s): BNP, PROBNP in the last 168 hours.   DDimer No results for input(s): DDIMER in the last 168 hours.   Radiology    DG CHEST PORT 1 VIEW  Result Date: 05/28/2020 CLINICAL DATA:  Central line placement. EXAM: PORTABLE CHEST 1 VIEW COMPARISON:  May 28, 2020 FINDINGS: A new left-sided central line is  identified. The distal tips of the double lumen catheter are in the region of central SVC. No pneumothorax. Bilateral pulmonary infiltrates and a probable right-sided pleural effusion are stable. No other changes. IMPRESSION: No pneumothorax after left central line placement. The distal tip of the new left central line is near the caval atrial junction. Persistent small right effusion and bilateral pulmonary infiltrates. Electronically Signed   By: Dorise Bullion III M.D   On: 05/28/2020 15:07   VAS [place] ABI WITH/WO TBI  Result Date: 05/28/2020 LOWER EXTREMITY DOPPLER STUDY Indications: Cool left foot. High Risk Factors: Hypertension, hyperlipidemia. Other Factors: PAD: he has known hi-grade stenosis in L SFA and popliteal artery                occlusion with reconstitution via collaterals per [place] in 2018.  Limitations: Today's exam was limited due to patient positioning, patient unable              to lie flat and an open wound. Comparison Study: No prior studies. Performing Technologist: Carlos Levering RVT  Examination Guidelines: A complete evaluation includes at minimum, Doppler waveform signals and systolic blood pressure reading at the level of bilateral brachial, anterior tibial, and posterior tibial arteries, when vessel segments are accessible. Bilateral testing is considered an integral part of a complete examination. Photoelectric Plethysmograph (PPG) waveforms and toe systolic pressure readings are included as required and additional duplex testing as needed. Limited examinations for reoccurring indications may be performed as noted.  ABI Findings: +--------+------------------+-----+---------+--------------+ Right   Rt Pressure (mmHg)IndexWaveform Comment        +--------+------------------+-----+---------+--------------+ Brachial                                Restricted arm +--------+------------------+-----+---------+--------------+ PTA     130               1.15 triphasic                +--------+------------------+-----+---------+--------------+ DP      126  1.12 biphasic                +--------+------------------+-----+---------+--------------+ +--------+------------------+-----+----------+-------+ Left    Lt Pressure (mmHg)IndexWaveform  Comment +--------+------------------+-----+----------+-------+ [id number]                    triphasic         +--------+------------------+-----+----------+-------+ PTA     76                0.67 monophasic        +--------+------------------+-----+----------+-------+ DP      59                0.52 monophasic        +--------+------------------+-----+----------+-------+ +-------+-----------+-----------+------------+------------+ ABI/TBIToday's ABIToday's TBIPrevious ABIPrevious TBI +-------+-----------+-----------+------------+------------+ Right  1.15                                           +-------+-----------+-----------+------------+------------+ Left   0.67                                           +-------+-----------+-----------+------------+------------+ The values are likely inaccurate due to patient positioning, and cuff placement. The patient's head was elevated to 30 due to 02 desaturation with anything lower. The left ankle cuff was placed just distal to the knee due to an open wound on the anterior, mid/distal calf. Unable to obtain toe pressures due to the patient's 02 desaturation.  Summary: Right: Resting right ankle-brachial index is within normal range. No evidence of significant right lower extremity arterial disease. Left: Resting left ankle-brachial index indicates moderate left lower extremity arterial disease.  *See table(s) above for measurements and observations.  Electronically signed by Servando Snare MD on 05/28/2020 at 3:40:55 PM.    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 He was previously on anticoagulation for PAF but this was discontinued due to f/u heart monitoring showing NSR. He presented to [facility] with worsening SOB, weight gain and edema. Originally seen in ED 05/16/20 and sent home after Lasix but continued to worsen prompting return to ED9/04/2020. On arrival found to be hypoxic and with development of coarse atrial fib versus atypical atrial flutter. He was also found to have new LV dysfunction with LVEF 35%, moderate-large pericardial effusion, and AKI on CKD stage III with poor UOP.  Assessment & Plan    1. Pericardial effusion/Tamponade - s/p pericardiocentesis, 1L of grossly bloody fluid - pericardial drain removed - etiology remains unclear, effusion occurred prior to starting anticoagution - plan for limited echo Monday, if significant recurrence would need window - likely plan for CT chest once medically recovered to eval for potential malignancy  - limited echo has been ordered for tomorrow  2. Acute systolic HF - presented massively fluid overloaded. Had tamponade physiology but also new systolic dysfunction LVEF 35-40% - diuresis initially very limited, after pericardiocentesis output did increase. Followed by renal, required very high dose diuretics - diuresis limited by significant renal dysfunction.  - started HD, fluid removal per nephrology - medical therapy limited by renal dysfunciton, avoiding  ACE/ARB/ARNI, also soft bp's at times. On lopressor 25mg  bid, intermittent low bp's have not consolidated to long acting toprol yet  - continue current meds, soft bp's at times. Needs to be able to tolerate HD as well, would not titrate meds.   3. Afib - from notes episode in 03/2019 in setting of covid - no recurrence by outpatient monitor, anticoag was stopped - recurrent afib this admission - on oral amio,  lopressor.  - was off anticoag due to hemorragic pericardial effusion, now back on hep gtt. COntinue hep gtt in ICU setting.   - continue lopressor, amio, hep gtt.    4. History of CAD - prior interventions - new drop in LVEF, given renal function would not pursue cath - no signs of ACS   5. AKI on CKD - HD per nephrology   6. PAD - per vascular, followed as inpatient  he has known hi-grade stenosis in L SFA and popliteal artery occlusion with reconstitution via collaterals per [place] in 2018  For questions or updates, please contact [place] Please consult [email or web address] for contact info under        Signed, Carlyle Dolly, MD  05/30/2020, 9:10 AM

## 2020-05-30 NOTE — Progress Notes (Signed)
NAME:  Robert [name], MRN:  [id number], DOB:  1951/08/06, LOS: 15 ADMISSION DATE:  05/14/2020, CONSULTATION DATE:  05/28/2020 REFERRING MD:  Dr. Teryl Lucy, CHIEF COMPLAINT:  Hypoxic    Brief History   69 year old gentleman past medical history of hypertension, hyperlipidemia, COPD, congestive heart failure, CKD stage IIIb.  Patient also has new systolic and diastolic cardiomyopathy, acute on chronic with ejection fraction 35 to 40%.  Patient with increasing oxygen requirements.  New enlarging pleural effusion.  Decision made for need of CVVHD after discussion with nephrology.  Pulmonary critical care consulted.  History of present illness   This is a 69 year old gentleman past medical history of hypertension hyperlipidemia, COPD, congestive heart failure, CKD 3B.  New systolic diastolic cardiomyopathy with an EF of 35 to 40%.  Patient has been on maximal diuretics with tapering urine output.  He had declined HD over the past couple of days hoping that the diuretics would continue to work.  Unfortunately had increasing oxygen requirements.  Chest x-ray completed today with a large right-sided pleural effusion as well as fluid retention in his bilateral lower extremities and pulmonary vascular congestion.  Decision was made for consultation pulmonary critical care due to increasing oxygen requirements and consideration for CVVHD.  I discussed case with nephrology.  They are in agreement to initiate CVVHD at this time.  Past Medical History   Past Medical History:  Diagnosis Date  . Anxiety   . CHF (congestive heart failure) ([place])   . Collagen vascular disease ([place])   . COPD (chronic obstructive pulmonary disease) ([place])   . Coronary artery disease   . COVID-19   . Full dentures   . GERD (gastroesophageal reflux disease)    Rolaids as needed  . High cholesterol   . History of MI (myocardial infarction)   . Hypertension    med. dosage increased 04/2016; has been on BP med. x 10 yrs.  . Incarcerated  umbilical hernia 56/3893  . Inguinal hernia 05/2016   bilateral   . Insulin dependent diabetes mellitus   . Ischemic cardiomyopathy    a. 03/2015 EF 40-45% by LV gram.  . Left leg cellulitis 10/08/2016  . PAF (paroxysmal atrial fibrillation) ([facility])    on home amio+metoprolol, plavix  . Rheumatoid arthritis(714.0)      Significant Hospital Events   ICU admission  Consults:  Pulmonary critical care Cardiology Nephrology Hospitalist  Procedures:  05/28/2020: HD catheter  Significant Diagnostic Tests:    Micro Data:    Antimicrobials:     Interim history/subjective:   No issues overnight.  Pressure remained stable.  Yesterday had trouble with urinary retention in and out cath x2.  Would like to avoid Foley.  Objective   Blood pressure 107/78, pulse 88, temperature (!) 96.1 F (35.6 C), temperature source Axillary, resp. rate (!) 25, height 6' (1.829 m), weight 104.6 kg, SpO2 95 %.        Intake/Output Summary (Last 24 hours) at 05/30/2020 0958 Last data filed at 05/30/2020 0900 Gross per 24 hour  Intake 761.94 ml  Output 3759 ml  Net -2997.06 ml   Filed Weights   05/28/20 0600 05/29/20 0500 05/30/20 0500  Weight: 111.6 kg 107.5 kg 104.6 kg    Examination: General: Elderly male sitting in chair on CVVHD HENT: NCAT, tracking appropriately Lungs: Bilateral diminished breath sounds in the bases no crackles no wheeze Cardiovascular: Regular rate rhythm S1-S2 Abdomen: Soft nontender nondistended Extremities: Persistent bilateral lower extremity edema Neuro: Alert oriented following commands sitting  up in chair GU: Deferred  Pressure Injury 07/04/19 Buttocks Right;Upper Stage II -  Partial thickness loss of dermis presenting as a shallow open ulcer with a red, pink wound bed without slough. (Active)  07/04/19 1429  Location: Buttocks  Location Orientation: Right;Upper  Staging: Stage II -  Partial thickness loss of dermis presenting as a shallow open ulcer with a  red, pink wound bed without slough.  Wound Description (Comments):   Present on Admission: Yes     Pressure Injury 05/26/20 Foot Left;Lateral Deep Tissue Pressure Injury - Purple or maroon localized area of discolored intact skin or blood-filled blister due to damage of underlying soft tissue from pressure and/or shear. dark purple circular, 5cm x 5 c (Active)  05/26/20 0820  Location: Foot  Location Orientation: Left;Lateral  Staging: Deep Tissue Pressure Injury - Purple or maroon localized area of discolored intact skin or blood-filled blister due to damage of underlying soft tissue from pressure and/or shear.  Wound Description (Comments): dark purple circular, 5cm x 5 cm  Present on Admission: Yes    Resolved Hospital Problem list     Assessment & Plan:   Acute hypoxemic respiratory failure requiring high flow nasal cannula. Bilateral pulmonary edema, vascular congestion Bilateral pleural effusions Positive cumulative fluid balance Plan: If bilateral pleural effusions do not resolve with ultrafiltration can consider thoracentesis. Wean FiO2 to maintain SPO2 greater than 90%.  Acute on chronic systolic diastolic heart failure Pericardial effusion w/ tamponade status post pericardiocentesis Plan: Volume removal with CVVHD Goal-directed heart failure therapy regimen per cardiology Patient not requiring vasopressors at this time.  AKI on CKD stage IIIb. Hyperkalemia Hyponatremia Plan: CVVHD per nephrology  Urinary retention Plan: Continue in and out catheterization when patient feels as if he needs to urinate. He does not want to do scheduled in and out caths as he feels like that is too traumatic. We will start Flomax.  A. 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  Best practice:  Diet: Regular Pain/Anxiety/Delirium protocol (if indicated): Not apple VAP protocol (if indicated): N/A DVT prophylaxis: Heparin  GI prophylaxis: None PPI Glucose control: SSI Mobility: Bedrest Code Status: Full code Family Communication: We will continue to update family. Disposition: ICU.  Labs   CBC: Recent Labs  Lab 05/26/20 0054 05/27/20 0548 05/28/20 0030 05/29/20 0343 05/30/20 0416  WBC 14.6* 12.5* 10.8* 8.5 11.3*  HGB 13.2 14.1 14.1 13.3 13.5  HCT 40.8 43.1 43.2 41.4 42.3  MCV 94.0 96.2 94.5 93.9 94.4  PLT 229 216 264 177 141*    Basic Metabolic Panel: Recent Labs  Lab 05/24/20 1005 05/12/2020 0910 05/26/20 0054 05/26/20 0054 05/27/20 0548 05/27/20 0548 05/28/20 0030 05/28/20 1623 05/29/20 0343 05/29/20 1541 05/30/20 0416  NA 130*   < > 132*   < > 133*   < > 134* 133* 134* 135 135  K 4.7   < > 5.4*   < > 5.3*   < > 4.0 4.2 4.2 4.5 4.2  CL 94*   < > 98   < > 97*   < > 95* 94* 99 101 101  CO2 25   < > 26   < > 26   < > 29 25 26 28 25   GLUCOSE 247*   < > 333*   < > 229*   < >  131* 236* 170* 170* 128*  BUN 105*   < > 114*   < > 119*   < > 120* 123* 78* 58* 51*  CREATININE 2.09*   < > 2.18*   < > 2.20*   < > 1.98* 1.76* 1.31* 1.21 1.49*  CALCIUM 8.8*   < > 8.6*   < > 8.6*   < > 8.9 8.6* 8.5* 8.3* 8.2*  MG 2.6*  --  2.6*  --  2.6*  --   --   --  2.5*  --  2.6*  PHOS  --   --   --   --   --   --   --  4.2 2.9 2.3* 2.8   < > = values in this interval not displayed.   GFR: Estimated Creatinine Clearance: 60.2 mL/min (A) (by C-G formula based on SCr of 1.49 mg/dL (H)). Recent Labs  Lab 05/27/20 0548 05/28/20 0030 05/29/20 0343 05/30/20 0416  WBC 12.5* 10.8* 8.5 11.3*    Liver Function Tests: Recent Labs  Lab 05/26/20 0054 05/26/20 0054 05/27/20 0548 05/27/20 0548 05/28/20 0030 05/28/20 1623 05/29/20 0343 05/29/20 1541 05/30/20 0416  AST 115*  --  130*  --  92*  --  79*  --  76*  ALT 230*  --  226*  --  220*  --  179*   --  179*  ALKPHOS 149*  --  154*  --  180*  --  157*  --  172*  BILITOT 0.6  --  0.4  --  0.8  --  0.8  --  0.8  PROT 5.6*  --  5.9*  --  6.2*  --  6.1*  --  6.4*  ALBUMIN 2.6*   < > 2.8*   < > 2.9* 2.8* 2.9* 2.9* 2.9*   < > = values in this interval not displayed.   No results for input(s): LIPASE, AMYLASE in the last 168 hours. No results for input(s): AMMONIA in the last 168 hours.  ABG    Component Value Date/Time   PHART 7.403 05/19/2019 1943   PCO2ART 52.4 (H) 05/19/2019 1943   PO2ART 390.0 (H) 05/19/2019 1943   HCO3 26.3 06/01/2020 1215   TCO2 28 05/29/2020 1215   ACIDBASEDEF 3.0 (H) 05/16/2020 1215   O2SAT 64.0 05/31/2020 1215     Coagulation Profile: No results for input(s): INR, PROTIME in the last 168 hours.  Cardiac Enzymes: No results for input(s): CKTOTAL, CKMB, CKMBINDEX, TROPONINI in the last 168 hours.  HbA1C: Hgb A1c MFr Bld  Date/Time Value Ref Range Status  05/20/2020 06:29 AM 7.3 (H) 4.8 - 5.6 % Final    Comment:    (NOTE) Pre diabetes:          5.7%-6.4%  Diabetes:              >6.4%  Glycemic control for   <7.0% adults with diabetes   07/03/2019 11:13 PM 6.7 (H) 4.8 - 5.6 % Final    Comment:    (NOTE) Pre diabetes:          5.7%-6.4% Diabetes:              >6.4% Glycemic control for   <7.0% adults with diabetes     CBG: Recent Labs  Lab 05/29/20 0622 05/29/20 1141 05/29/20 1526 05/29/20 2208 05/30/20 0608  GLUCAP 177* 228* 174* 129* 124*     Past Medical History  He,  has a past medical history of Anxiety,  CHF (congestive heart failure) ([facility]), Collagen vascular disease ([place]), COPD (chronic obstructive pulmonary disease) ([place]), Coronary artery disease, COVID-19, Full dentures, GERD (gastroesophageal reflux disease), High cholesterol, History of MI (myocardial infarction), Hypertension, Incarcerated umbilical hernia (93/7169), Inguinal hernia (05/2016), Insulin dependent diabetes mellitus, Ischemic cardiomyopathy, Left leg  cellulitis (10/08/2016), PAF (paroxysmal atrial fibrillation) ([place]), and Rheumatoid arthritis(714.0).   Surgical History    Past Surgical History:  Procedure Laterality Date  . CARDIAC CATHETERIZATION N/A 02/01/2015   Procedure: Left Heart Cath and Coronary Angiography;  Surgeon: Belva Crome, MD;  Location: [place] [facility];  Service: Cardiovascular;  Laterality: N/A;  . CARDIAC CATHETERIZATION N/A 03/22/2015   Procedure: Left Heart Cath and Coronary Angiography;  Surgeon: Leonie Man, MD;  Location: [facility] [place] [facility];  Service: Cardiovascular;  Laterality: N/A;  . CARDIAC CATHETERIZATION  09/05/2002; 03/09/2003; 04/10/2005;06/02/2005; 05/09/2006; 02/09/2007; 03/01/2007  . COLONOSCOPY  2008   Dr. Gala Romney: diverticulosis, hyperplastic polyp.  . CORONARY ANGIOPLASTY  11/14/2012; 02/01/2015  . CORONARY ANGIOPLASTY WITH STENT PLACEMENT  05/16/2012   "1; makes total ~ 4"  . CORONARY STENT INTERVENTION N/A 06/06/2017   Procedure: CORONARY STENT INTERVENTION;  Surgeon: Belva Crome, MD;  Location: [facility];  Service: Cardiovascular;  Laterality: N/A;  . INSERTION OF MESH Bilateral 05/24/2016   Procedure: INSERTION OF MESH;  Surgeon: Coralie Keens, MD;  Location: [facility];  Service: General;  Laterality: Bilateral;  . LAPAROSCOPIC CHOLECYSTECTOMY    . LAPAROSCOPIC INGUINAL HERNIA WITH UMBILICAL HERNIA Bilateral 05/24/2016   Procedure: BILATERAL LAPAROSCOPIC INGUINAL HERNIA REPAIR WITH MESH AND UMBILICAL HERNIA REPAIR WITH MESH;  Surgeon: Coralie Keens, MD;  Location: [facility];  Service: General;  Laterality: Bilateral;  . LEFT HEART CATH AND CORONARY ANGIOGRAPHY N/A 06/06/2017   Procedure: LEFT HEART CATH AND CORONARY ANGIOGRAPHY;  Surgeon: Belva Crome, MD;  Location: [place] [facility];  Service: Cardiovascular;  Laterality: N/A;  . LEFT HEART CATHETERIZATION WITH CORONARY ANGIOGRAM N/A 12/22/2011   Procedure: LEFT HEART CATHETERIZATION WITH CORONARY  ANGIOGRAM;  Surgeon: Troy Sine, MD;  Location: [facility] CATH LAB;  Service: Cardiovascular;  Laterality: N/A;  . LEFT HEART CATHETERIZATION WITH CORONARY ANGIOGRAM Bilateral 05/16/2012   Procedure: LEFT HEART CATHETERIZATION WITH CORONARY ANGIOGRAM;  Surgeon: Lorretta Harp, MD;  Location: [facility] CATH LAB;  Service: Cardiovascular;  Laterality: Bilateral;  . LEFT HEART CATHETERIZATION WITH CORONARY ANGIOGRAM N/A 11/14/2012   Procedure: LEFT HEART CATHETERIZATION WITH CORONARY ANGIOGRAM;  Surgeon: Troy Sine, MD;  Location: [facility] CATH LAB;  Service: Cardiovascular;  Laterality: N/A;  . LEFT HEART CATHETERIZATION WITH CORONARY ANGIOGRAM N/A 09/01/2013   Procedure: LEFT HEART CATHETERIZATION WITH CORONARY ANGIOGRAM;  Surgeon: Leonie Man, MD;  Location: [facility] CATH LAB;  Service: Cardiovascular;  Laterality: N/A;  . [place]; 1973; 1985   x 3  . NM MYOCAR PERF WALL MOTION  08/30/2009   No significant ischemia  . OPEN REDUCTION INTERNAL FIXATION (ORIF) DISTAL RADIAL FRACTURE Right 12/10/2016   Procedure: OPEN REDUCTION INTERNAL FIXATION (ORIF) DISTAL RADIAL FRACTURE;  Surgeon: Iran Planas, MD;  Location: [place];  Service: Orthopedics;  Laterality: Right;  . PERCUTANEOUS CORONARY INTERVENTION-BALLOON ONLY  12/22/2011   Procedure: PERCUTANEOUS CORONARY INTERVENTION-BALLOON ONLY;  Surgeon: Troy Sine, MD;  Location: [facility] CATH LAB;  Service: Cardiovascular;;  . PERCUTANEOUS CORONARY INTERVENTION-BALLOON ONLY  11/14/2012   Procedure: PERCUTANEOUS CORONARY INTERVENTION-BALLOON ONLY;  Surgeon: Troy Sine, MD;  Location: [facility] C[facility] LAB;  Service: Cardiovascular;;  .  PERCUTANEOUS CORONARY STENT INTERVENTION (PCI-S)  05/16/2012   Procedure: PERCUTANEOUS CORONARY STENT INTERVENTION (PCI-S);  Surgeon: Lorretta Harp, MD;  Location: [facility] CATH LAB;  Service: Cardiovascular;;  . PERCUTANEOUS CORONARY STENT INTERVENTION (PCI-S)  09/01/2013   Procedure: PERCUTANEOUS CORONARY STENT INTERVENTION (PCI-S);   Surgeon: Leonie Man, MD;  Location: [facility] CATH LAB;  Service: Cardiovascular;;  . PERICARDIOCENTESIS N/A 05/17/2020   Procedure: PERICARDIOCENTESIS;  Surgeon: Belva Crome, MD;  Location: [place] [facility];  Service: Cardiovascular;  Laterality: N/A;  . RIGHT HEART CATH N/A 05/28/2020   Procedure: RIGHT HEART CATH;  Surgeon: Belva Crome, MD;  Location: [place] [facility];  Service: Cardiovascular;  Laterality: N/A;     Social History   reports that he has been smoking cigarettes. He has been smoking about 1.00 pack per day. He has never used smokeless tobacco. He reports that he does not drink alcohol and does not use drugs.   Family History   His family history includes Colon cancer in his brother and mother; Heart disease in his father and mother; Hypertension in his mother; Lung cancer in his mother.   Allergies Allergies  Allergen Reactions  . Ivp Dye [Iodinated Diagnostic Agents] Itching and Rash     Home Medications  Prior to Admission medications   Medication Sig Start Date End Date Taking? Authorizing Provider  acarbose (PRECOSE) 100 MG tablet Take 1 tablet (100 mg total) by mouth 3 (three) times daily with meals. 06/06/19  Yes Love, Ivan Anchors, PA-C  acetaminophen (TYLENOL) 650 MG CR tablet Take 650 mg by mouth every 8 (eight) hours as needed for pain.   Yes [provider]  aspirin EC 81 MG tablet Take 1 tablet (81 mg total) by mouth daily. 02/12/20  Yes Croitoru, Mihai, MD  atorvastatin (LIPITOR) 80 MG tablet Take 1 tablet (80 mg total) by mouth daily at 6 PM. 12/30/19  Yes Croitoru, Mihai, MD  clopidogrel (PLAVIX) 75 MG tablet Take 1 tablet (75 mg total) by mouth daily. 10/07/19  Yes Kroeger, Daleen Snook M., PA-C  fish oil-omega-3 fatty acids 1000 MG capsule Take 1 g by mouth daily.    Yes [provider]  fluticasone Asencion Islam) 50 MCG/ACT nasal spray  11/19/19  Yes [provider]  folic acid (FOLVITE) 1 MG tablet Take 1 tablet (1 mg total) by mouth daily.  06/06/19  Yes Love, Ivan Anchors, PA-C  furosemide (LASIX) 40 MG tablet Take 2 tablets (80 mg total) by mouth daily. 04/12/20  Yes Croitoru, Mihai, MD  gabapentin (NEURONTIN) 300 MG capsule Take 300 mg by mouth 3 (three) times daily.   Yes [provider]  glimepiride (AMARYL) 2 MG tablet Take 1 tablet (2 mg total) by mouth daily with breakfast. 06/06/19  Yes Love, Pamela S, PA-C  Insulin Glargine (TOUJEO SOLOSTAR [place]) Inject 25 Units into the skin at bedtime. 25 units    Yes [provider]  isosorbide mononitrate (IMDUR) 60 MG 24 hr tablet Take 1 tablet (60 mg total) by mouth daily. 02/12/20 02/06/21 Yes Croitoru, Mihai, MD  losartan (COZAAR) 25 MG tablet Take 0.5 tablets (12.5 mg total) by mouth daily. 09/29/19  Yes Kroeger, Daleen Snook M., PA-C  metoprolol tartrate (LOPRESSOR) 25 MG tablet Take 1 tablet (25 mg total) by mouth 2 (two) times daily. 09/29/19  Yes Kroeger, Daleen Snook M., PA-C  mometasone-formoterol (DULERA) 200-5 MCG/ACT AERO Inhale 2 puffs into the lungs 2 (two) times daily. 06/06/19  Yes Love, Ivan Anchors, PA-C  nitroGLYCERIN (NITROSTAT)  0.4 MG SL tablet Place 1 tablet (0.4 mg total) under the tongue every 5 (five) minutes x 3 doses as needed for chest pain. 04/24/16  Yes Croitoru, Mihai, MD  potassium chloride SA (KLOR-CON) 20 MEQ tablet Take 1 tablet (20 mEq total) by mouth 2 (two) times daily. 02/03/20  Yes Kroeger, Daleen Snook M., PA-C  primidone (MYSOLINE) 50 MG tablet Take 100 mg by mouth at bedtime. 09/18/19  Yes [provider]  ALPRAZolam Duanne Moron) 0.5 MG tablet Take 1 tablet (0.5 mg total) by mouth 3 (three) times daily as needed for anxiety or sleep. Patient not taking: Reported on 05/17/2020 06/06/19   Love, Ivan Anchors, PA-C  collagenase (SANTYL) ointment Apply topically daily. And cover with damp to dry dressing daily. Change more frequently if soiled 06/06/19   Love, Ivan Anchors, PA-C  terbinafine (LAMISIL) 250 MG tablet Take 250 mg by mouth daily. Patient not taking: Reported on  05/21/2020 11/26/19   [provider]  vitamin C (VITAMIN C) 250 MG tablet Take 1 tablet (250 mg total) by mouth 2 (two) times daily. Patient not taking: Reported on 05/28/2020 06/06/19   Bary Leriche, PA-C     This patient is critically ill with multiple organ system failure; which, requires frequent high complexity decision making, assessment, support, evaluation, and titration of therapies. This was completed through the application of advanced monitoring technologies and extensive interpretation of multiple databases. During this encounter critical care time was devoted to patient care services described in this note for 31 minutes.  Garner Nash, DO [facility] 05/30/2020 9:58 AM

## 2020-05-31 ENCOUNTER — Inpatient Hospital Stay (HOSPITAL_COMMUNITY): Payer: Medicare HMO

## 2020-05-31 DIAGNOSIS — I313 Pericardial effusion (noninflammatory): Secondary | ICD-10-CM

## 2020-05-31 LAB — RENAL FUNCTION PANEL
Albumin: 3 g/dL — ABNORMAL LOW (ref 3.5–5.0)
Albumin: 3 g/dL — ABNORMAL LOW (ref 3.5–5.0)
Anion gap: 8 (ref 5–15)
Anion gap: 7 (ref 5–15)
BUN: 29 mg/dL — ABNORMAL HIGH (ref 8–23)
BUN: 24 mg/dL — ABNORMAL HIGH (ref 8–23)
CO2: 25 mmol/L (ref 22–32)
CO2: 26 mmol/L (ref 22–32)
Calcium: 8.5 mg/dL — ABNORMAL LOW (ref 8.9–10.3)
Calcium: 8.5 mg/dL — ABNORMAL LOW (ref 8.9–10.3)
Chloride: 102 mmol/L (ref 98–111)
Chloride: 101 mmol/L (ref 98–111)
Creatinine, Ser: 1.28 mg/dL — ABNORMAL HIGH (ref 0.61–1.24)
Creatinine, Ser: 1.26 mg/dL — ABNORMAL HIGH (ref 0.61–1.24)
GFR calc Af Amer: >60 mL/min (ref >60)
GFR calc Af Amer: >60 mL/min (ref >60)
GFR calc non Af Amer: 58 mL/min — ABNORMAL LOW (ref >60)
GFR calc non Af Amer: 59 mL/min — ABNORMAL LOW (ref >60)
Glucose, Bld: 128 mg/dL — ABNORMAL HIGH (ref 70–99)
Glucose, Bld: 133 mg/dL — ABNORMAL HIGH (ref 70–99)
Phosphorus: 2.4 mg/dL — ABNORMAL LOW (ref 2.5–4.6)
Phosphorus: 2.2 mg/dL — ABNORMAL LOW (ref 2.5–4.6)
Potassium: 4.3 mmol/L (ref 3.5–5.1)
Potassium: 4.4 mmol/L (ref 3.5–5.1)
Sodium: 135 mmol/L (ref 135–145)
Sodium: 134 mmol/L — ABNORMAL LOW (ref 135–145)

## 2020-05-31 LAB — ECHOCARDIOGRAM LIMITED
Height: 72 in
Weight: 3579.2 oz

## 2020-05-31 LAB — GLUCOSE, CAPILLARY
Glucose-Capillary: 126 mg/dL — ABNORMAL HIGH (ref 70–99)
Glucose-Capillary: 260 mg/dL — ABNORMAL HIGH (ref 70–99)
Glucose-Capillary: 124 mg/dL — ABNORMAL HIGH (ref 70–99)
Glucose-Capillary: 147 mg/dL — ABNORMAL HIGH (ref 70–99)

## 2020-05-31 LAB — CBC
HCT: 43.2 % (ref 39.0–52.0)
Hemoglobin: 13.8 g/dL (ref 13.0–17.0)
MCH: 30.3 pg (ref 26.0–34.0)
MCHC: 31.9 g/dL (ref 30.0–36.0)
MCV: 94.7 fL (ref 80.0–100.0)
Platelets: 125 10*3/uL — ABNORMAL LOW (ref 150–400)
RBC: 4.56 MIL/uL (ref 4.22–5.81)
RDW: 14.6 % (ref 11.5–15.5)
WBC: 11.7 10*3/uL — ABNORMAL HIGH (ref 4.0–10.5)
nRBC: 0 % (ref 0.0–0.2)

## 2020-05-31 LAB — HEPARIN LEVEL (UNFRACTIONATED): Heparin Unfractionated: 0.28 IU/mL — ABNORMAL LOW (ref 0.30–0.70)

## 2020-05-31 LAB — MAGNESIUM: Magnesium: 2.6 mg/dL — ABNORMAL HIGH (ref 1.7–2.4)

## 2020-05-31 MED ORDER — ENSURE ENLIVE PO LIQD
237.0000 mL | Freq: Two times a day (BID) | ORAL | Status: DC
  Administered 2020-06-03 – 2020-06-13 (×13): 237 mL via ORAL

## 2020-05-31 MED ORDER — "THROMBI-PAD 3""X3"" EX PADS"
1.0000 | MEDICATED_PAD | Freq: Once | CUTANEOUS | Status: AC
  Administered 2020-05-31: 1 via TOPICAL
  Filled 2020-05-31: qty 1

## 2020-05-31 MED ORDER — B COMPLEX-C PO TABS
1.0000 | ORAL_TABLET | Freq: Every day | ORAL | Status: DC
  Administered 2020-05-31 – 2020-06-15 (×16): 1 via ORAL
  Filled 2020-05-31 (×16): qty 1

## 2020-05-31 MED ORDER — "THROMBI-PAD 3""X3"" EX PADS"
1.0000 | MEDICATED_PAD | Freq: Once | CUTANEOUS | Status: AC
  Administered 2020-06-01: 1 via TOPICAL
  Filled 2020-05-31: qty 1

## 2020-05-31 NOTE — Progress Notes (Signed)
NAME:  Robert [name], MRN:  [id number], DOB:  08-17-1951, LOS: 65 ADMISSION DATE:  06/09/2020, CONSULTATION DATE:  05/28/2020 REFERRING MD:  Dr. Teryl Lucy, CHIEF COMPLAINT:  Hypoxic    Brief History   69 year old gentleman past medical history of hypertension, hyperlipidemia, COPD, congestive heart failure, CKD stage IIIb.  Patient also has new systolic and diastolic cardiomyopathy, acute on chronic with ejection fraction 35 to 40%.  Patient with increasing oxygen requirements.  New enlarging pleural effusion.  Decision made for need of CVVHD after discussion with nephrology.  Pulmonary critical care consulted.  History of present illness   This is a 69 year old gentleman past medical history of hypertension hyperlipidemia, COPD, congestive heart failure, CKD 3B.  New systolic diastolic cardiomyopathy with an EF of 35 to 40%.  Patient has been on maximal diuretics with tapering urine output.  He had declined HD over the past couple of days hoping that the diuretics would continue to work.  Unfortunately had increasing oxygen requirements.  Chest x-ray completed today with a large right-sided pleural effusion as well as fluid retention in his bilateral lower extremities and pulmonary vascular congestion.  Decision was made for consultation pulmonary critical care due to increasing oxygen requirements and consideration for CVVHD.  I discussed case with nephrology.  They are in agreement to initiate CVVHD at this time.  Past Medical History   Past Medical History:  Diagnosis Date  . Anxiety   . CHF (congestive heart failure) ([place])   . Collagen vascular disease ([place])   . COPD (chronic obstructive pulmonary disease) ([place])   . Coronary artery disease   . COVID-19   . Full dentures   . GERD (gastroesophageal reflux disease)    Rolaids as needed  . High cholesterol   . History of MI (myocardial infarction)   . Hypertension    med. dosage increased 04/2016; has been on BP med. x 10 yrs.  . Incarcerated  umbilical hernia 40/8144  . Inguinal hernia 05/2016   bilateral   . Insulin dependent diabetes mellitus   . Ischemic cardiomyopathy    a. 03/2015 EF 40-45% by LV gram.  . Left leg cellulitis 10/08/2016  . PAF (paroxysmal atrial fibrillation) ([facility])    on home amio+metoprolol, plavix  . Rheumatoid arthritis(714.0)      Significant Hospital Events   ICU admission  Consults:  Pulmonary critical care Cardiology Nephrology Hospitalist  Procedures:  05/28/2020: HD catheter  Significant Diagnostic Tests:    Micro Data:    Antimicrobials:     Interim history/subjective:   Tolerating CVVHD.  Awake alert sitting up at side of bed today.  -19L.  Objective   Blood pressure 101/77, pulse 84, temperature (!) 96.5 F (35.8 C), temperature source Axillary, resp. rate (!) 24, height 6' (1.829 m), weight 101.5 kg, SpO2 93 %.        Intake/Output Summary (Last 24 hours) at 05/31/2020 0711 Last data filed at 05/31/2020 0700 Gross per 24 hour  Intake 1301.25 ml  Output 4378 ml  Net -3076.75 ml   Filed Weights   05/29/20 0500 05/30/20 0500 05/31/20 0500  Weight: 107.5 kg 104.6 kg 101.5 kg    Examination: General: Elderly male sitting up in chair tolerating CVVHD HENT: NCAT, tracking appropriately Lungs: Bilateral diminished breath sounds in the bases no crackles no wheeze normal effort no accessory muscle use Cardiovascular: Regular rhythm S1-S2 Abdomen: Soft nontender nondistended Extremities: Persistent bilateral lower extremity edema, however this is improved. Neuro: Alert oriented following commands GU: Deferred  Pressure Injury 07/04/19 Buttocks Right;Upper Stage II -  Partial thickness loss of dermis presenting as a shallow open ulcer with a red, pink wound bed without slough. (Active)  07/04/19 1429  Location: Buttocks  Location Orientation: Right;Upper  Staging: Stage II -  Partial thickness loss of dermis presenting as a shallow open ulcer with a red, pink wound bed  without slough.  Wound Description (Comments):   Present on Admission: Yes     Pressure Injury 05/26/20 Foot Left;Lateral Deep Tissue Pressure Injury - Purple or maroon localized area of discolored intact skin or blood-filled blister due to damage of underlying soft tissue from pressure and/or shear. dark purple circular, 5cm x 5 c (Active)  05/26/20 0820  Location: Foot  Location Orientation: Left;Lateral  Staging: Deep Tissue Pressure Injury - Purple or maroon localized area of discolored intact skin or blood-filled blister due to damage of underlying soft tissue from pressure and/or shear.  Wound Description (Comments): dark purple circular, 5cm x 5 cm  Present on Admission: Yes    Resolved Hospital Problem list     Assessment & Plan:   Acute hypoxemic respiratory failure requiring high flow nasal cannula. Bilateral pulmonary edema, vascular congestion Bilateral pleural effusions Positive cumulative fluid balance Plan: Bilateral pleural effusions if they do not resolve can consider therapeutic thoracentesis Chest x-ray today.  I suspect they are improving with CVVHD.  As he is 19 L negative. Continue to wean FiO2 to maintain SPO2 greater than 90%  Acute on chronic systolic diastolic heart failure Pericardial effusion w/ tamponade status post pericardiocentesis Plan: Volume removal with CVVHD Goal-directed heart failure regimen per cardiology services  AKI on CKD stage IIIb. Hyperkalemia Hyponatremia Plan: CVVHD per nephrology  Urinary retention Plan: Continue Flomax  A. 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  Labs   CBC: Recent Labs  Lab 05/27/20 0548 05/28/20 0030 05/29/20 0343 05/30/20 0416 05/31/20 0401  WBC 12.5* 10.8* 8.5 11.3* 11.7*  HGB 14.1 14.1 13.3 13.5 13.8  HCT 43.1 43.2 41.4 42.3 43.2  MCV 96.2 94.5 93.9 94.4 94.7  PLT 216 264 177 141* 125*    Basic Metabolic Panel: Recent Labs  Lab 05/26/20 0054 05/26/20 0054 05/27/20 0548 05/28/20 0030 05/29/20 0343 05/29/20 1541 05/30/20 0416 05/30/20 1605 05/31/20 0401  NA 132*   < > 133*   < > 134* 135 135 135 135  K 5.4*   < > 5.3*   < > 4.2 4.5 4.2 4.8 4.3  CL 98   < > 97*   < > 99 101 101 102 102  CO2 26   < > 26   < > 26 28 25 27 25   GLUCOSE 333*   < > 229*   < > 170* 170* 128* 127* 128*  BUN 114*   < > 119*   < > 78* 58* 51* 38* 29*  CREATININE 2.18*   < > 2.20*   < > 1.31* 1.21 1.49* 1.32* 1.28*  CALCIUM 8.6*   < > 8.6*   < > 8.5* 8.3* 8.2* 8.4* 8.5*  MG 2.6*  --  2.6*  --  2.5*  --  2.6*  --  2.6*  PHOS  --   --   --    < > 2.9 2.3* 2.8 2.3* 2.4*   < > = values in this interval not displayed.   GFR: Estimated Creatinine Clearance: 69.1 mL/min (A) (by C-G formula based on SCr of 1.28 mg/dL (H)). Recent Labs  Lab 05/28/20 0030 05/29/20 0343 05/30/20 0416 05/31/20 0401  WBC 10.8* 8.5 11.3* 11.7*    Liver Function Tests: Recent Labs  Lab 05/26/20 0054 05/26/20 0054 05/27/20 0548 05/27/20 0548 05/28/20 0030 05/28/20 1623 05/29/20 0343 05/29/20 1541 05/30/20 0416 05/30/20 1605 05/31/20 0401  AST 115*  --  130*  --  92*  --  79*  --  76*  --   --   ALT 230*  --  226*  --  220*  --  179*  --  179*  --   --   ALKPHOS 149*  --  154*  --  180*  --  157*  --  172*  --   --   BILITOT 0.6  --  0.4  --  0.8  --  0.8  --  0.8  --   --   PROT 5.6*  --  5.9*  --  6.2*  --  6.1*  --  6.4*  --   --   ALBUMIN 2.6*   < > 2.8*   < > 2.9*   < > 2.9* 2.9* 2.9* 2.9*  3.0*   < > = values in this interval not displayed.   No results for input(s): LIPASE, AMYLASE in the last 168 hours. No results for input(s): AMMONIA in the last 168 hours.  ABG    Component Value Date/Time   PHART 7.403 05/19/2019 1943   PCO2ART 52.4 (H) 05/19/2019 1943   PO2ART 390.0 (H) 05/19/2019 1943   HCO3 26.3 05/27/2020 1215   TCO2 28 05/30/2020 1215   ACIDBASEDEF 3.0 (H) 06/08/2020 1215   O2SAT 64.0 06/08/2020 1215     Coagulation Profile: No results for input(s): INR, PROTIME in the last 168 hours.  Cardiac Enzymes: No results for input(s): CKTOTAL, CKMB, CKMBINDEX, TROPONINI in the last 168 hours.  HbA1C: Hgb A1c MFr Bld  Date/Time Value Ref Range Status  05/20/2020 06:29 AM 7.3 (H) 4.8 - 5.6 % Final    Comment:    (NOTE) Pre diabetes:          5.7%-6.4%  Diabetes:              >6.4%  Glycemic control for   <7.0% adults with diabetes   07/03/2019 11:13 PM 6.7 (H) 4.8 - 5.6 % Final    Comment:    (NOTE) Pre diabetes:          5.7%-6.4% Diabetes:              >6.4% Glycemic control for   <7.0% adults with diabetes     CBG: Recent Labs  Lab 05/30/20 0608 05/30/20 1110 05/30/20 1515 05/30/20 2205 05/31/20 0600  GLUCAP 124* 199* 161* 115* 126*     This patient is critically ill with multiple organ system failure; which, requires frequent high complexity decision making, assessment, support, evaluation, and titration of therapies. This was completed through the application of advanced monitoring technologies and extensive interpretation of multiple databases. During this encounter critical care time was devoted to patient care services described in this note for 31 minutes.  Garner Nash, DO [facility] 05/31/2020 7:11 AM

## 2020-05-31 NOTE — Progress Notes (Signed)
Patient ID: Robert [name], male   DOB: [birth year], 69 y.o.   MRN: [id number] S: tolerated UF of 3 liters overnight. O:BP 107/71   Pulse 87   Temp 97.6 F (36.4 C) (Oral)   Resp (!) 21   Ht 6' (1.829 m)   Wt 101.5 kg   SpO2 96%   BMI 30.34 kg/m   Intake/Output Summary (Last 24 hours) at 05/31/2020 1035 Last data filed at 05/31/2020 1000 Gross per 24 hour  Intake 1121.64 ml  Output 4190 ml  Net -3068.36 ml   Intake/Output: I/O last 3 completed shifts: In: 1535.3 [P.O.:1105; I.V.:430.3] Out: 6206 [Urine:150; WNIOE:7035]  Intake/Output this shift:  Total I/O In: 156.4 [P.O.:120; I.V.:36.4] Out: 555 [Other:555] Weight change: -3.13 kg Gen: chronically ill-appearing in NAD CVS: RRR no rub Resp: bibasilar crackles Abd: +BS, soft, NT/ND Ext:3+ edema BLE  Recent Labs  Lab 05/26/20 0054 05/26/20 0054 05/27/20 0548 05/27/20 0548 05/28/20 0030 05/28/20 1623 05/29/20 0343 05/29/20 1541 05/30/20 0416 05/30/20 1605 05/31/20 0401  NA 132*   < > 133*   < > 134* 133* 134* 135 135 135 135  K 5.4*   < > 5.3*   < > 4.0 4.2 4.2 4.5 4.2 4.8 4.3  CL 98   < > 97*   < > 95* 94* 99 101 101 102 102  CO2 26   < > 26   < > 29 25 26 28 25 27 25   GLUCOSE 333*   < > 229*   < > 131* 236* 170* 170* 128* 127* 128*  BUN 114*   < > 119*   < > 120* 123* 78* 58* 51* 38* 29*  CREATININE 2.18*   < > 2.20*   < > 1.98* 1.76* 1.31* 1.21 1.49* 1.32* 1.28*  ALBUMIN 2.6*   < > 2.8*   < > 2.9* 2.8* 2.9* 2.9* 2.9* 2.9* 3.0*  CALCIUM 8.6*   < > 8.6*   < > 8.9 8.6* 8.5* 8.3* 8.2* 8.4* 8.5*  PHOS  --   --   --   --   --  4.2 2.9 2.3* 2.8 2.3* 2.4*  AST 115*  --  130*  --  92*  --  79*  --  76*  --   --   ALT 230*  --  226*  --  220*  --  179*  --  179*  --   --    < > = values in this interval not displayed.   Liver Function Tests: Recent Labs  Lab 05/28/20 0030 05/28/20 1623 05/29/20 0343 05/29/20 1541 05/30/20 0416 05/30/20 1605 05/31/20 0401  AST 92*  --  79*  --  76*  --   --   ALT 220*  --   179*  --  179*  --   --   ALKPHOS 180*  --  157*  --  172*  --   --   BILITOT 0.8  --  0.8  --  0.8  --   --   PROT 6.2*  --  6.1*  --  6.4*  --   --   ALBUMIN 2.9*   < > 2.9*   < > 2.9* 2.9* 3.0*   < > = values in this interval not displayed.   No results for input(s): LIPASE, AMYLASE in the last 168 hours. No results for input(s): AMMONIA in the last 168 hours. CBC: Recent Labs  Lab 05/27/20 0548 05/27/20 0548 05/28/20 0030 05/28/20 0030  05/29/20 0343 05/30/20 0416 05/31/20 0401  WBC 12.5*   < > 10.8*   < > 8.5 11.3* 11.7*  HGB 14.1   < > 14.1   < > 13.3 13.5 13.8  HCT 43.1   < > 43.2   < > 41.4 42.3 43.2  MCV 96.2  --  94.5  --  93.9 94.4 94.7  PLT 216   < > 264   < > 177 141* 125*   < > = values in this interval not displayed.   Cardiac Enzymes: No results for input(s): CKTOTAL, CKMB, CKMBINDEX, TROPONINI in the last 168 hours. CBG: Recent Labs  Lab 05/30/20 0608 05/30/20 1110 05/30/20 1515 05/30/20 2205 05/31/20 0600  GLUCAP 124* 199* 161* 115* 126*    Iron Studies: No results for input(s): IRON, TIBC, TRANSFERRIN, FERRITIN in the last 72 hours. Studies/Results: DG CHEST PORT 1 VIEW  Result Date: 05/31/2020 CLINICAL DATA:  Reason for exam: pleural effusion EXAM: PORTABLE CHEST - 1 VIEW COMPARISON:  05/28/2020 FINDINGS: Stable left IJ hemodialysis catheter to the proximal SVC. Elevation of the right diaphragmatic leaflet with probable small adjacent effusion. Small left effusion as before. Patchy ill-defined airspace opacities in the lung bases left greater than right as before. Heart size upper limits normal. Coronary stent. Aortic Atherosclerosis (ICD10-170.0). No pneumothorax. Visualized bones unremarkable. IMPRESSION: Stable small  pleural effusions and bibasilar airspace disease. Electronically Signed   By: Lucrezia Europe M.D.   On: 05/31/2020 07:48   ECHOCARDIOGRAM LIMITED  Result Date: 05/31/2020    ECHOCARDIOGRAM LIMITED REPORT   Patient Name:   Robert [name]  Date of Exam: 05/31/2020 Medical Rec #:  [id number]      Height:       72.0 in Accession #:    [id number]     Weight:       223.7 lb Date of Birth:  [birth year]      BSA:          2.234 m Patient Age:    69 years       BP:           99/65 mmHg Patient Gender: M              HR:           94 bpm. Exam Location:  Inpatient Procedure: Limited Color Doppler and Color Doppler Indications:    pericardial effusion 423.9  History:        Patient has prior history of Echocardiogram examinations, most                 recent 05/26/2020. COPD and chronic kidney disease; Risk                 Factors:Dyslipidemia and Diabetes.  Sonographer:    Johny Chess Referring Phys: [id number] [place]  1. Left ventricular ejection fraction, by estimation, is 35 to 40%. The left ventricle has moderately decreased function. The left ventricle demonstrates global hypokinesis.  2. Trivial mitral valve regurgitation.  3. Tricuspid regurgitation signal is inadequate for assessing PA pressure.  4. The inferior vena cava is dilated in size with <50% respiratory variability, suggesting right atrial pressure of 15 mmHg.  5. There is no evidence of pericardial effusion. FINDINGS  Left Ventricle: Left ventricular ejection fraction, by estimation, is 35 to 40%. The left ventricle has moderately decreased function. The left ventricle demonstrates global hypokinesis. Right Ventricle: Tricuspid regurgitation signal is inadequate for assessing PA pressure. Pericardium: There  is no evidence of pericardial effusion. Mitral Valve: Trivial mitral valve regurgitation. Venous: The inferior vena cava is dilated in size with less than 50% respiratory variability, suggesting right atrial pressure of 15 mmHg. IVC IVC diam: 2.60 cm Fransico Him MD Electronically signed by Fransico Him MD Signature Date/Time: 05/31/2020/9:59:44 AM    Final    . amiodarone  200 mg Oral BID  . Chlorhexidine Gluconate Cloth  6 each Topical Daily  .  dextromethorphan-guaiFENesin  1 tablet Oral BID  . fluticasone  1 spray Each Nare Daily  . folic acid  1 mg Oral Daily  . gabapentin  300 mg Oral TID  . influenza vaccine adjuvanted  0.5 mL Intramuscular Tomorrow-1000  . insulin aspart  0-20 Units Subcutaneous TID WC  . insulin aspart  6 Units Subcutaneous TID WC  . insulin glargine  10 Units Subcutaneous Daily  . ipratropium-albuterol  3 mL Nebulization BID  . melatonin  3 mg Oral QHS  . metoprolol tartrate  25 mg Oral BID  . mometasone-formoterol  2 puff Inhalation BID  . primidone  100 mg Oral QHS  . sodium chloride flush  10-40 mL Intracatheter Q12H  . sodium chloride flush  3 mL Intravenous Q12H  . sodium chloride flush  3 mL Intravenous Q12H  . tamsulosin  0.4 mg Oral Daily    BMET    Component Value Date/Time   NA 135 05/31/2020 0401   NA 135 10/17/2019 1628   K 4.3 05/31/2020 0401   CL 102 05/31/2020 0401   CO2 25 05/31/2020 0401   GLUCOSE 128 (H) 05/31/2020 0401   BUN 29 (H) 05/31/2020 0401   BUN 34 (H) 10/17/2019 1628   CREATININE 1.28 (H) 05/31/2020 0401   CALCIUM 8.5 (L) 05/31/2020 0401   GFRNONAA 58 (L) 05/31/2020 0401   GFRAA >60 05/31/2020 0401   CBC    Component Value Date/Time   WBC 11.7 (H) 05/31/2020 0401   RBC 4.56 05/31/2020 0401   HGB 13.8 05/31/2020 0401   HGB 16.3 09/25/2017 1135   HCT 43.2 05/31/2020 0401   HCT 47.1 09/25/2017 1135   PLT 125 (L) 05/31/2020 0401   PLT 159 09/25/2017 1135   MCV 94.7 05/31/2020 0401   MCV 91 09/25/2017 1135   MCH 30.3 05/31/2020 0401   MCHC 31.9 05/31/2020 0401   RDW 14.6 05/31/2020 0401   RDW 14.5 09/25/2017 1135   LYMPHSABS 0.6 (L) 05/20/2020 0629   MONOABS 1.1 (H) 05/20/2020 0629   EOSABS 0.0 05/20/2020 0629   BASOSABS 0.0 05/20/2020 0629     Assessment/Plan:  1. AKI/CKD stage 3b- in setting of acute decompensated CHF with pericardial effusion.  Sub-optimal response to IV diuretics and started on CRRT 05/28/20 for better UF.  No significant UOP  since starting CRRT.  Had been producing over 2.5 liters with IV diuretics.  Continue to follow for now.  2. Acute on chronic systolic CHF- cardiology following.  Holding off on IV contrast due to AKI/CKD 3. Pericardial effusion s/p pericardiocentesis with improvement 4. Acute hypoxic respiratory failure due to CHF.  Improved with volume removal 5. Hyponatremia- due to CHF, improving with CRRT 6. COPD 7. DM type 2 per primary 8. A fib- currently rate controlled and on anticoagulation.  Per cardiology.  Donetta Potts, MD [employer] [phone]

## 2020-05-31 NOTE — Progress Notes (Signed)
Progress Note  Patient Name: Robert [name] Date of Encounter: 05/31/2020  Primary Cardiologist:   Sanda Klein, MD   Subjective   Breathing better but fatigued.  Denies pain.    Inpatient Medications    Scheduled Meds: . amiodarone  200 mg Oral BID  . Chlorhexidine Gluconate Cloth  6 each Topical Daily  . dextromethorphan-guaiFENesin  1 tablet Oral BID  . fluticasone  1 spray Each Nare Daily  . folic acid  1 mg Oral Daily  . gabapentin  300 mg Oral TID  . influenza vaccine adjuvanted  0.5 mL Intramuscular Tomorrow-1000  . insulin aspart  0-20 Units Subcutaneous TID WC  . insulin aspart  6 Units Subcutaneous TID WC  . insulin glargine  10 Units Subcutaneous Daily  . ipratropium-albuterol  3 mL Nebulization BID  . melatonin  3 mg Oral QHS  . metoprolol tartrate  25 mg Oral BID  . mometasone-formoterol  2 puff Inhalation BID  . primidone  100 mg Oral QHS  . sodium chloride flush  10-40 mL Intracatheter Q12H  . sodium chloride flush  3 mL Intravenous Q12H  . sodium chloride flush  3 mL Intravenous Q12H  . tamsulosin  0.4 mg Oral Daily   Continuous Infusions: .  prismasol BGK 4/2.5 500 mL/hr at 05/31/20 0758  .  prismasol BGK 4/2.5 300 mL/hr at 05/30/20 1834  . sodium chloride    . sodium chloride    . heparin 1,200 Units/hr (05/31/20 0800)  . prismasol BGK 4/2.5 1,800 mL/hr at 05/31/20 0648   PRN Meds: sodium chloride, sodium chloride, acetaminophen, albuterol, heparin, loperamide, Muscle Rub, ondansetron (ZOFRAN) IV, oxyCODONE, sodium chloride flush, sodium chloride flush, sodium chloride flush   Vital Signs    Vitals:   05/31/20 0700 05/31/20 0751 05/31/20 0800 05/31/20 0829  BP: 101/77  103/70   Pulse: 84  91   Resp: (!) 24  (!) 22   Temp:  97.6 F (36.4 C)    TempSrc:  Oral    SpO2: 93%  94% 92%  Weight:      Height:        Intake/Output Summary (Last 24 hours) at 05/31/2020 2595 Last data filed at 05/31/2020 0800 Gross per 24 hour  Intake 1169.33  ml  Output 3989 ml  Net -2819.67 ml   Filed Weights   05/29/20 0500 05/30/20 0500 05/31/20 0500  Weight: 107.5 kg 104.6 kg 101.5 kg    Telemetry    Atrial fib with controlled rate and premature ectopic complexes - Personally Reviewed  ECG    NA - Personally Reviewed  Physical Exam   GEN: No acute distress.   Neck: No  JVD Cardiac: Irregular RR, no murmurs, rubs, or gallops.  Respiratory:    Decreased breath sounds on the right base.  GI: Soft, nontender, non-distended  MS:   Moderate to severe diffuse edema edema; No deformity. Neuro:  Nonfocal  Psych: Normal affect   Labs    Chemistry Recent Labs  Lab 05/28/20 0030 05/28/20 1623 05/29/20 0343 05/29/20 1541 05/30/20 0416 05/30/20 1605 05/31/20 0401  NA 134*   < > 134*   < > 135 135 135  K 4.0   < > 4.2   < > 4.2 4.8 4.3  CL 95*   < > 99   < > 101 102 102  CO2 29   < > 26   < > 25 27 25   GLUCOSE 131*   < > 170*   < >  128* 127* 128*  BUN 120*   < > 78*   < > 51* 38* 29*  CREATININE 1.98*   < > 1.31*   < > 1.49* 1.32* 1.28*  CALCIUM 8.9   < > 8.5*   < > 8.2* 8.4* 8.5*  PROT 6.2*  --  6.1*  --  6.4*  --   --   ALBUMIN 2.9*   < > 2.9*   < > 2.9* 2.9* 3.0*  AST 92*  --  79*  --  76*  --   --   ALT 220*  --  179*  --  179*  --   --   ALKPHOS 180*  --  157*  --  172*  --   --   BILITOT 0.8  --  0.8  --  0.8  --   --   GFRNONAA 34*   < > 56*   < > 48* 55* 58*  GFRAA 39*   < > >60   < > 55* >60 >60  ANIONGAP 10   < > 9   < > 9 6 8    < > = values in this interval not displayed.     Hematology Recent Labs  Lab 05/29/20 0343 05/30/20 0416 05/31/20 0401  WBC 8.5 11.3* 11.7*  RBC 4.41 4.48 4.56  HGB 13.3 13.5 13.8  HCT 41.4 42.3 43.2  MCV 93.9 94.4 94.7  MCH 30.2 30.1 30.3  MCHC 32.1 31.9 31.9  RDW 14.2 14.5 14.6  PLT 177 141* 125*    Cardiac EnzymesNo results for input(s): TROPONINI in the last 168 hours. No results for input(s): TROPIPOC in the last 168 hours.   BNPNo results for input(s): BNP, PROBNP  in the last 168 hours.   DDimer No results for input(s): DDIMER in the last 168 hours.   Radiology    DG CHEST PORT 1 VIEW  Result Date: 05/31/2020 CLINICAL DATA:  Reason for exam: pleural effusion EXAM: PORTABLE CHEST - 1 VIEW COMPARISON:  05/28/2020 FINDINGS: Stable left IJ hemodialysis catheter to the proximal SVC. Elevation of the right diaphragmatic leaflet with probable small adjacent effusion. Small left effusion as before. Patchy ill-defined airspace opacities in the lung bases left greater than right as before. Heart size upper limits normal. Coronary stent. Aortic Atherosclerosis (ICD10-170.0). No pneumothorax. Visualized bones unremarkable. IMPRESSION: Stable small  pleural effusions and bibasilar airspace disease. Electronically Signed   By: Lucrezia Europe M.D.   On: 05/31/2020 07:48    Cardiac Studies   ECHO:  Repeat prelim with only mild residual pericardial effusionl   Patient Profile     69 y.o. 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 He was previously on anticoagulation for PAF but this was discontinued due to f/u heart monitoring showing NSR. He presented to [facility] with worsening SOB, weight gain and edema. Originally seen in ED 05/16/20 and sent home after Lasix but continued to worsen prompting return to ED9/04/2020. On arrival found to be hypoxic and with development of coarse atrial fib versus atypical atrial flutter. He was also found to have new LV dysfunction with LVEF 35%, moderate-large pericardial effusion, and AKI on CKD stage III with poor UOP.  Assessment & Plan    Pericardial effusion/tamponade:  Status post pericardiocentesis with 1 liter of fluid removed and drain subsequently removed.  Limited echo today reviewed  without significant accumulation of effusion.   Acute systolic HF:  CXR with elevated right hemidiaphragm  and small stable right effusions with vascular congestion.    He is net negative 19 liters.      Continue diuresis per renal    Atrial fib:   On amiodarone and beta blocker.   Hold off on oral anticoagulation as this was a frankly bloody effusion.    On heparin.  We will need to make a decision about oral anticoagulation pending any further need for pericardial fluid management.  I am inclined not to suggest DOAC/warfarin in the short term given bloody effusion.   I might suggest this in a month or so if he remains without effusion on follow up echo and volume is managed in one way or another.      CAD:  Not pursuing invasive evaluation with renal insufficiency.  Consider out patient ischemia work up.     AKI:  Discussed with nephrology today and volume management continues.  How, not currently considered to be in need of permanent renal replacement therapy so we continue to avoid nephrotoxic agents (ACE/ARB/ARNI/contrast).      Creat continues to improve.   PAD:  Medical management perr vascular he has known hi-grade stenosis in L SFA and popliteal artery occlusion with reconstitution via collaterals per [place] in 2018.  Continue medical management.    For questions or updates, please contact [facility] Please consult [email or web address] for contact info under Cardiology/STEMI.   Signed, Minus Breeding, MD  05/31/2020, 9:07 AM

## 2020-05-31 NOTE — Progress Notes (Signed)
  Echocardiogram 2D Echocardiogram has been performed.  Johny Chess 05/31/2020, 9:43 AM

## 2020-05-31 NOTE — Progress Notes (Signed)
[facility] for heparin Indication: atrial fibrillation  Allergies  Allergen Reactions   Ivp Dye [Iodinated Diagnostic Agents] Itching and Rash    Patient Measurements: Height: 6' (182.9 cm) Weight: 101.5 kg (223 lb 11.2 oz) IBW/kg (Calculated) : 77.6 Heparin Dosing Weight: 103  Vital Signs: Temp: 97.6 F (36.4 C) (09/20 0751) Temp Source: Oral (09/20 0751) BP: 103/70 (09/20 0800) Pulse Rate: 91 (09/20 0800)  Labs: Recent Labs    05/28/20 1623 05/28/20 2311 05/28/20 2350 05/29/20 0343 05/29/20 0343 05/29/20 1011 05/29/20 1541 05/29/20 1921 05/30/20 0416 05/30/20 1605 05/31/20 0401  HGB  --   --   --  13.3   < >  --   --   --  13.5  --  13.8  HCT  --   --   --  41.4  --   --   --   --  42.3  --  43.2  PLT  --   --   --  177  --   --   --   --  141*  --  125*  APTT  --   --  116*  --   --  37*  --  52*  --   --   --   HEPARINUNFRC  --    < >  --   --   --  0.22*  --  0.39 0.50  --  0.28*  CREATININE   < >  --   --  1.31*   < >  --    < >  --  1.49* 1.32* 1.28*   < > = values in this interval not displayed.    Estimated Creatinine Clearance: 69.1 mL/min (A) (by C-G formula based on SCr of 1.28 mg/dL (H)).   Assessment: 7 yoM on apixaban PTA for hx AFib admitted with pericardial tamponade s/p pericardiocentesis. Anticoagulation has been held, drain now removed 9/16. Pharmacy asked to restart IV heparin with no bolus, will target lower goal. Last dose of apixaban given 9/13 a.m.   Heparin level just below goal at 0.28 this morning. No issues with line or bleeding reported per RN. Hgb stable, plt trending down to 125.  Heparin drip currently at 1200 uts/hr.  Goal of Therapy:  Heparin level 0.3-0.5 units/ml Monitor platelets by anticoagulation protocol: Yes   Plan:  Increase IV heparin to 1300 units/hr. -Daily heparin level and CBC.  Erin Hearing PharmD., BCPS Clinical Pharmacist 05/31/2020 9:25 AM      [facility] pharmacy  phone numbers are listed on [email or web address]

## 2020-05-31 NOTE — Progress Notes (Signed)
Initial Nutrition Assessment  DOCUMENTATION CODES:   Not applicable  INTERVENTION:   Liberalized diet to carb modified    Ensure Enlive po BID, each supplement provides 350 kcal and 20 grams of protein  Add b complex with vitamin C to account for losses with CRRT  NUTRITION DIAGNOSIS:   Increased nutrient needs related to acute illness (AKI on CRRT) as evidenced by estimated needs.  GOAL:   Patient will meet greater than or equal to 90% of their needs  MONITOR:   PO intake, Supplement acceptance, Weight trends, I & O's, Labs  REASON FOR ASSESSMENT:   Rounds    ASSESSMENT:   Patient with PMH significant for CHF, COPD, CAD, previous COVID 36, GERD, HTN, incarcerated umbilical hernia, bilateral inguinal hernia, DM, ischemic cardiomyopathy, and PAF. Presents this admission with bilateral pulmonary edema.   9/14- s/p pericardiocentesis  9/17- start CRRT  Pt discussed during ICU rounds and with RN.   On CRRT, down 19L.   Patient denies loss in appetite PTA. States he typically eats two meals daily that consist of protein, grains, and vegetables. Appetite this admission great. Last 5 meal completions charted as 75-100%. Discussed the importance of protein intake for preservation of lean body mass while on CRRT. Pt willing to try Ensure.   Pt endorses losing from 240 lb to 213 lb last year when admitted at [facility] for 58 days. States since then pt has maintained his weight. Weight this admission likely reflects fluid accumulation.   Medications: folic acid, SS novolog, lantus  Labs: Phosphorus 2.4 (L) Mg 2.6 (H) CBG 124-260  NUTRITION - FOCUSED PHYSICAL EXAM:    Most Recent Value  Orbital Region No depletion  Upper Arm Region Mild depletion  Thoracic and Lumbar Region Unable to assess  Buccal Region No depletion  Temple Region Mild depletion  Clavicle Bone Region Moderate depletion  Clavicle and Acromion Bone Region Moderate depletion  Scapular Bone Region Unable to  assess  Dorsal Hand Mild depletion  Patellar Region No depletion  Anterior Thigh Region No depletion  Posterior Calf Region No depletion  Edema (RD Assessment) Moderate  [bilateral lower extremites]  Hair Reviewed  Eyes Reviewed  Mouth Reviewed  Skin Reviewed  Nails Reviewed     Diet Order:   Diet Order            Diet renal/carb modified with fluid restriction Diet-HS Snack? Nothing; Fluid restriction: 1200 mL Fluid; Room service appropriate? Yes; Fluid consistency: Thin  Diet effective now                 EDUCATION NEEDS:   Education needs have been addressed  Skin:  Skin Assessment: Skin Integrity Issues: Skin Integrity Issues:: DTI DTI: L left  Last BM:  9/20  Height:   Ht Readings from Last 1 Encounters:  05/29/20 6' (1.829 m)    Weight:   Wt Readings from Last 1 Encounters:  05/31/20 101.5 kg    BMI:  Body mass index is 30.34 kg/m.  Estimated Nutritional Needs:   Kcal:  2400-2600 kcal  Protein:  120-135 grams  Fluid:  >/= 2 L/day   Mariana Single RD, LDN Clinical Nutrition Pager listed in [place]

## 2020-06-01 LAB — RENAL FUNCTION PANEL
Albumin: 3 g/dL — ABNORMAL LOW (ref 3.5–5.0)
Albumin: 2.8 g/dL — ABNORMAL LOW (ref 3.5–5.0)
Anion gap: 9 (ref 5–15)
Anion gap: 8 (ref 5–15)
BUN: 19 mg/dL (ref 8–23)
BUN: 24 mg/dL — ABNORMAL HIGH (ref 8–23)
CO2: 25 mmol/L (ref 22–32)
CO2: 25 mmol/L (ref 22–32)
Calcium: 8.6 mg/dL — ABNORMAL LOW (ref 8.9–10.3)
Calcium: 8.1 mg/dL — ABNORMAL LOW (ref 8.9–10.3)
Chloride: 101 mmol/L (ref 98–111)
Chloride: 98 mmol/L (ref 98–111)
Creatinine, Ser: 1.32 mg/dL — ABNORMAL HIGH (ref 0.61–1.24)
Creatinine, Ser: 1.76 mg/dL — ABNORMAL HIGH (ref 0.61–1.24)
GFR calc Af Amer: >60 mL/min (ref >60)
GFR calc Af Amer: 45 mL/min — ABNORMAL LOW (ref >60)
GFR calc non Af Amer: 55 mL/min — ABNORMAL LOW (ref >60)
GFR calc non Af Amer: 39 mL/min — ABNORMAL LOW (ref >60)
Glucose, Bld: 167 mg/dL — ABNORMAL HIGH (ref 70–99)
Glucose, Bld: 335 mg/dL — ABNORMAL HIGH (ref 70–99)
Phosphorus: 2.7 mg/dL (ref 2.5–4.6)
Phosphorus: 2.9 mg/dL (ref 2.5–4.6)
Potassium: 4.5 mmol/L (ref 3.5–5.1)
Potassium: 4.8 mmol/L (ref 3.5–5.1)
Sodium: 135 mmol/L (ref 135–145)
Sodium: 131 mmol/L — ABNORMAL LOW (ref 135–145)

## 2020-06-01 LAB — POCT ACTIVATED CLOTTING TIME
Activated Clotting Time: 120 seconds
Activated Clotting Time: 131 seconds
Activated Clotting Time: 131 seconds
Activated Clotting Time: 164 seconds
Activated Clotting Time: 153 seconds
Activated Clotting Time: 158 seconds

## 2020-06-01 LAB — GLUCOSE, CAPILLARY
Glucose-Capillary: 160 mg/dL — ABNORMAL HIGH (ref 70–99)
Glucose-Capillary: 190 mg/dL — ABNORMAL HIGH (ref 70–99)
Glucose-Capillary: 340 mg/dL — ABNORMAL HIGH (ref 70–99)
Glucose-Capillary: 157 mg/dL — ABNORMAL HIGH (ref 70–99)

## 2020-06-01 LAB — CBC
HCT: 41 % (ref 39.0–52.0)
Hemoglobin: 13.2 g/dL (ref 13.0–17.0)
MCH: 30.8 pg (ref 26.0–34.0)
MCHC: 32.2 g/dL (ref 30.0–36.0)
MCV: 95.6 fL (ref 80.0–100.0)
Platelets: 106 10*3/uL — ABNORMAL LOW (ref 150–400)
RBC: 4.29 MIL/uL (ref 4.22–5.81)
RDW: 14.6 % (ref 11.5–15.5)
WBC: 11.6 10*3/uL — ABNORMAL HIGH (ref 4.0–10.5)
nRBC: 0 % (ref 0.0–0.2)

## 2020-06-01 LAB — MAGNESIUM: Magnesium: 2.6 mg/dL — ABNORMAL HIGH (ref 1.7–2.4)

## 2020-06-01 LAB — HEPARIN LEVEL (UNFRACTIONATED): Heparin Unfractionated: 0.38 IU/mL (ref 0.30–0.70)

## 2020-06-01 MED ORDER — SODIUM CHLORIDE 0.9 % IV SOLN
250.0000 [IU]/h | INTRAVENOUS | Status: DC
  Administered 2020-06-01: 250 [IU]/h via INTRAVENOUS_CENTRAL
  Administered 2020-06-02: 1550 [IU]/h via INTRAVENOUS_CENTRAL
  Administered 2020-06-02: 1150 [IU]/h via INTRAVENOUS_CENTRAL
  Filled 2020-06-01 (×4): qty 2

## 2020-06-01 MED ORDER — LIDOCAINE-EPINEPHRINE 2 %-1:100000 IJ SOLN
20.0000 mL | Freq: Once | INTRAMUSCULAR | Status: AC
  Administered 2020-06-01: 10 mL via INTRADERMAL
  Filled 2020-06-01: qty 20

## 2020-06-01 MED ORDER — HEPARIN BOLUS VIA INFUSION (CRRT)
1000.0000 [IU] | INTRAVENOUS | Status: DC | PRN
  Filled 2020-06-01: qty 1000

## 2020-06-01 MED ORDER — HEPARIN SODIUM (PORCINE) 5000 UNIT/ML IJ SOLN
5000.0000 [IU] | Freq: Three times a day (TID) | INTRAMUSCULAR | Status: DC
  Administered 2020-06-01 – 2020-06-03 (×6): 5000 [IU] via SUBCUTANEOUS
  Filled 2020-06-01 (×6): qty 1

## 2020-06-01 NOTE — Progress Notes (Signed)
NAME:  Robert [name], MRN:  [id number], DOB:  1951/08/05, LOS: 62 ADMISSION DATE:  05/18/2020, CONSULTATION DATE:  05/28/2020 REFERRING MD:  Dr. Teryl Lucy, CHIEF COMPLAINT:  Hypoxic    Brief History   69 year old gentleman past medical history of hypertension, hyperlipidemia, COPD, congestive heart failure, CKD stage IIIb.  Patient also has new systolic and diastolic cardiomyopathy, acute on chronic with ejection fraction 35 to 40%.  Patient with increasing oxygen requirements.  New enlarging pleural effusion.  Decision made for need of CVVHD after discussion with nephrology.  Pulmonary critical care consulted.  History of present illness   This is a 69 year old gentleman past medical history of hypertension hyperlipidemia, COPD, congestive heart failure, CKD 3B.  New systolic diastolic cardiomyopathy with an EF of 35 to 40%.  Patient has been on maximal diuretics with tapering urine output.  He had declined HD over the past couple of days hoping that the diuretics would continue to work.  Unfortunately had increasing oxygen requirements.  Chest x-ray completed today with a large right-sided pleural effusion as well as fluid retention in his bilateral lower extremities and pulmonary vascular congestion.  Decision was made for consultation pulmonary critical care due to increasing oxygen requirements and consideration for CVVHD.  I discussed case with nephrology.  They are in agreement to initiate CVVHD at this time.  Past Medical History   Past Medical History:  Diagnosis Date  . Anxiety   . CHF (congestive heart failure) ([place])   . Collagen vascular disease ([place])   . COPD (chronic obstructive pulmonary disease) ([place])   . Coronary artery disease   . COVID-19   . Full dentures   . GERD (gastroesophageal reflux disease)    Rolaids as needed  . High cholesterol   . History of MI (myocardial infarction)   . Hypertension    med. dosage increased 04/2016; has been on BP med. x 10 yrs.  . Incarcerated  umbilical hernia [id number]/2956  . Inguinal hernia 05/2016   bilateral   . Insulin dependent diabetes mellitus   . Ischemic cardiomyopathy    a. 03/2015 EF 40-45% by LV gram.  . Left leg cellulitis 10/08/2016  . PAF (paroxysmal atrial fibrillation) ([facility])    on home amio+metoprolol, plavix  . Rheumatoid arthritis(714.0)      Significant Hospital Events   ICU admission  Consults:  Pulmonary critical care Cardiology Nephrology Hospitalist  Procedures:  05/28/2020: HD catheter  Significant Diagnostic Tests:    Micro Data:    Antimicrobials:     Interim history/subjective:   No issues overnight. Continues to tolerate CVVHD. Does have ongoing oozing from the catheter insertion site. On heparin. -22 L fluid balance.  Objective   Blood pressure 100/67, pulse (!) 102, temperature (!) 97.1 F (36.2 C), resp. rate 19, height 6' (1.829 m), weight 101.5 kg, SpO2 95 %.        Intake/Output Summary (Last 24 hours) at 06/01/2020 0932 Last data filed at 06/01/2020 0900 Gross per 24 hour  Intake 1271.04 ml  Output 4713 ml  Net -3441.96 ml   Filed Weights   05/29/20 0500 05/30/20 0500 05/31/20 0500  Weight: 107.5 kg 104.6 kg 101.5 kg    Examination: General: Elderly male, sitting up in bed on CVVHD HENT: NCAT, tracking appropriately Neck: Left IJ catheter with oozing Lungs: Diminished breath sounds bilaterally no crackles no wheeze normal effort no accessory muscle use Cardiovascular: Regular rate rhythm, S1-S2 Abdomen: Soft nontender nondistended Extremities: Persistent bilateral lower extremity edema however improved  Neuro: Patient alert oriented following commands no focal deficit GU: Deferred  Pressure Injury 07/04/19 Buttocks Right;Upper Stage II -  Partial thickness loss of dermis presenting as a shallow open ulcer with a red, pink wound bed without slough. (Active)  07/04/19 1429  Location: Buttocks  Location Orientation: Right;Upper  Staging: Stage II -  Partial  thickness loss of dermis presenting as a shallow open ulcer with a red, pink wound bed without slough.  Wound Description (Comments):   Present on Admission: Yes     Pressure Injury 05/26/20 Foot Left;Lateral Deep Tissue Pressure Injury - Purple or maroon localized area of discolored intact skin or blood-filled blister due to damage of underlying soft tissue from pressure and/or shear. dark purple circular, 5cm x 5 c (Active)  05/26/20 0820  Location: Foot  Location Orientation: Left;Lateral  Staging: Deep Tissue Pressure Injury - Purple or maroon localized area of discolored intact skin or blood-filled blister due to damage of underlying soft tissue from pressure and/or shear.  Wound Description (Comments): dark purple circular, 5cm x 5 cm  Present on Admission: Yes    Resolved Hospital Problem list     Assessment & Plan:   Acute hypoxemic respiratory failure requiring high flow nasal cannula. Bilateral pulmonary edema, vascular congestion Bilateral pleural effusions Positive cumulative fluid balance Plan: Continue volume removal with CVVHD at this point -22 L. Discussed with nephrology this morning. If right-sided pleural effusion does not resolve with ongoing CVVHD may need to consider therapeutic tap.  Acute on chronic systolic diastolic heart failure Pericardial effusion w/ tamponade status post pericardiocentesis Plan: Goal-directed heart failure regimen per cardiology services Continue beta-blocker Volume removal CVVHD.  AKI on CKD stage IIIb. Hyperkalemia Hyponatremia Plan: CVVHD per nephrology  Urinary retention Plan: Continue Flomax  A. fib Plan: Heparin per pharmacy  PAD, stenosis of Left SFA and popliteal artery occlusion  Plan: Appreciate vascular surgery input Plans for outpatient follow-up  Pressure injury of skin  Plan: Per documentation of flow sheets above.  Best practice:  Diet: Regular Pain/Anxiety/Delirium protocol (if indicated): Not  apple VAP protocol (if indicated): N/A DVT prophylaxis: Heparin  GI prophylaxis: None PPI Glucose control: SSI Mobility: Bedrest Code Status: Full code Family Communication: team to update family  Disposition: ICU.  Labs   CBC: Recent Labs  Lab 05/28/20 0030 05/29/20 0343 05/30/20 0416 05/31/20 0401 06/01/20 0322  WBC 10.8* 8.5 11.3* 11.7* 11.6*  HGB 14.1 13.3 13.5 13.8 13.2  HCT 43.2 41.4 42.3 43.2 41.0  MCV 94.5 93.9 94.4 94.7 95.6  PLT 264 177 141* 125* 106*    Basic Metabolic Panel: Recent Labs  Lab 05/27/20 0548 05/28/20 0030 05/29/20 0343 05/29/20 1541 05/30/20 0416 05/30/20 1605 05/31/20 0401 05/31/20 1528 06/01/20 0322  NA 133*   < > 134*   < > 135 135 135 134* 135  K 5.3*   < > 4.2   < > 4.2 4.8 4.3 4.4 4.5  CL 97*   < > 99   < > 101 102 102 101 101  CO2 26   < > 26   < > 25 27 25 26 25   GLUCOSE 623*   < > 170*   < > 128* 127* 128* 133* 167*  BUN 119*   < > 78*   < > 51* 38* 29* 24* 19  CREATININE 2.20*   < > 1.31*   < > 1.49* 1.32* 1.28* 1.26* 1.32*  CALCIUM 8.6*   < > 8.5*   < >  8.2* 8.4* 8.5* 8.5* 8.6*  MG 2.6*  --  2.5*  --  2.6*  --  2.6*  --  2.6*  PHOS  --    < > 2.9   < > 2.8 2.3* 2.4* 2.2* 2.7   < > = values in this interval not displayed.   GFR: Estimated Creatinine Clearance: 67 mL/min (A) (by C-G formula based on SCr of 1.32 mg/dL (H)). Recent Labs  Lab 05/29/20 0343 05/30/20 0416 05/31/20 0401 06/01/20 0322  WBC 8.5 11.3* 11.7* 11.6*    Liver Function Tests: Recent Labs  Lab 05/26/20 0054 05/26/20 0054 05/27/20 0548 05/27/20 0548 05/28/20 0030 05/28/20 1623 05/29/20 0343 05/29/20 1541 05/30/20 0416 05/30/20 1605 05/31/20 0401 05/31/20 1528 06/01/20 0322  AST 115*  --  130*  --  92*  --  79*  --  76*  --   --   --   --   ALT 230*  --  226*  --  220*  --  179*  --  179*  --   --   --   --   ALKPHOS 149*  --  154*  --  180*  --  157*  --  172*  --   --   --   --   BILITOT 0.6  --  0.4  --  0.8  --  0.8  --  0.8  --    --   --   --   PROT 5.6*  --  5.9*  --  6.2*  --  6.1*  --  6.4*  --   --   --   --   ALBUMIN 2.6*   < > 2.8*   < > 2.9*   < > 2.9*   < > 2.9* 2.9* 3.0* 3.0* 3.0*   < > = values in this interval not displayed.   No results for input(s): LIPASE, AMYLASE in the last 168 hours. No results for input(s): AMMONIA in the last 168 hours.  ABG    Component Value Date/Time   PHART 7.403 05/19/2019 1943   PCO2ART 52.4 (H) 05/19/2019 1943   PO2ART 390.0 (H) 05/19/2019 1943   HCO3 26.3 06/01/2020 1215   TCO2 28 06/05/2020 1215   ACIDBASEDEF 3.0 (H) 05/12/2020 1215   O2SAT 64.0 05/31/2020 1215     Coagulation Profile: No results for input(s): INR, PROTIME in the last 168 hours.  Cardiac Enzymes: No results for input(s): CKTOTAL, CKMB, CKMBINDEX, TROPONINI in the last 168 hours.  HbA1C: Hgb A1c MFr Bld  Date/Time Value Ref Range Status  05/20/2020 06:29 AM 7.3 (H) 4.8 - 5.6 % Final    Comment:    (NOTE) Pre diabetes:          5.7%-6.4%  Diabetes:              >6.4%  Glycemic control for   <7.0% adults with diabetes   07/03/2019 11:13 PM 6.7 (H) 4.8 - 5.6 % Final    Comment:    (NOTE) Pre diabetes:          5.7%-6.4% Diabetes:              >6.4% Glycemic control for   <7.0% adults with diabetes     CBG: Recent Labs  Lab 05/31/20 0600 05/31/20 1113 05/31/20 1543 05/31/20 2149 06/01/20 0627  GLUCAP 126* 260* 124* 147* 160*     This patient is critically ill with multiple organ system failure; which, requires frequent high complexity  decision making, assessment, support, evaluation, and titration of therapies. This was completed through the application of advanced monitoring technologies and extensive interpretation of multiple databases. During this encounter critical care time was devoted to patient care services described in this note for 32 minutes.  Garner Nash, DO [facility] 06/01/2020 9:32 AM

## 2020-06-01 NOTE — Progress Notes (Signed)
Progress Note  Patient Name: Robert [name] Date of Encounter: 06/01/2020  Primary Cardiologist:   Robert Klein, MD   Subjective   He had oozing from his dialysis cath site all night.  Otherwise he feels OK without pain and he says that his breathing is OK.   Inpatient Medications    Scheduled Meds: . amiodarone  200 mg Oral BID  . B-complex with vitamin C  1 tablet Oral Daily  . Chlorhexidine Gluconate Cloth  6 each Topical Daily  . dextromethorphan-guaiFENesin  1 tablet Oral BID  . feeding supplement (ENSURE ENLIVE)  237 mL Oral BID BM  . fluticasone  1 spray Each Nare Daily  . folic acid  1 mg Oral Daily  . gabapentin  300 mg Oral TID  . influenza vaccine adjuvanted  0.5 mL Intramuscular Tomorrow-1000  . insulin aspart  0-20 Units Subcutaneous TID WC  . insulin aspart  6 Units Subcutaneous TID WC  . insulin glargine  10 Units Subcutaneous Daily  . ipratropium-albuterol  3 mL Nebulization BID  . melatonin  3 mg Oral QHS  . metoprolol tartrate  25 mg Oral BID  . mometasone-formoterol  2 puff Inhalation BID  . primidone  100 mg Oral QHS  . sodium chloride flush  10-40 mL Intracatheter Q12H  . sodium chloride flush  3 mL Intravenous Q12H  . sodium chloride flush  3 mL Intravenous Q12H  . tamsulosin  0.4 mg Oral Daily   Continuous Infusions: .  prismasol BGK 4/2.5 500 mL/hr at 05/31/20 1821  .  prismasol BGK 4/2.5 300 mL/hr at 06/01/20 0527  . sodium chloride    . sodium chloride    . heparin 1,300 Units/hr (06/01/20 0800)  . prismasol BGK 4/2.5 1,800 mL/hr at 06/01/20 0312   PRN Meds: sodium chloride, sodium chloride, acetaminophen, albuterol, heparin, loperamide, Muscle Rub, ondansetron (ZOFRAN) IV, oxyCODONE, sodium chloride flush, sodium chloride flush, sodium chloride flush   Vital Signs    Vitals:   06/01/20 0500 06/01/20 0600 06/01/20 0700 06/01/20 0800  BP: 96/74 105/74 101/62 107/84  Pulse: 91 78 92 (!) 176  Resp: 18 (!) 24 19 (!) 26  Temp:       TempSrc:      SpO2: 96% 91% 90% 96%  Weight:      Height:        Intake/Output Summary (Last 24 hours) at 06/01/2020 0806 Last data filed at 06/01/2020 0800 Gross per 24 hour  Intake 1150.06 ml  Output 4245 ml  Net -3094.94 ml   Filed Weights   05/29/20 0500 05/30/20 0500 05/31/20 0500  Weight: 107.5 kg 104.6 kg 101.5 kg    Telemetry    Atrial fib - Personally Reviewed  ECG    NA - Personally Reviewed  Physical Exam   GEN: No  acute distress.   Neck: No  JVD Cardiac: Irregular RR, no murmurs, rubs, or gallops.  Respiratory:    Decreased breath sounds.  GI: Soft, nontender, non-distended, normal bowel sounds  MS:  Moderate leg edema; No deformity. Neuro:   Nonfocal  Psych: Oriented and appropriate    Labs    Chemistry Recent Labs  Lab 05/28/20 0030 05/28/20 1623 05/29/20 0343 05/29/20 1541 05/30/20 0416 05/30/20 1605 05/31/20 0401 05/31/20 1528 06/01/20 0322  NA 134*   < > 134*   < > 135   < > 135 134* 135  K 4.0   < > 4.2   < > 4.2   < >  4.3 4.4 4.5  CL 95*   < > 99   < > 101   < > 102 101 101  CO2 29   < > 26   < > 25   < > 25 26 25   GLUCOSE 131*   < > 170*   < > 128*   < > 128* 133* 167*  BUN 120*   < > 78*   < > 51*   < > 29* 24* 19  CREATININE 1.98*   < > 1.31*   < > 1.49*   < > 1.28* 1.26* 1.32*  CALCIUM 8.9   < > 8.5*   < > 8.2*   < > 8.5* 8.5* 8.6*  PROT 6.2*  --  6.1*  --  6.4*  --   --   --   --   ALBUMIN 2.9*   < > 2.9*   < > 2.9*   < > 3.0* 3.0* 3.0*  AST 92*  --  79*  --  76*  --   --   --   --   ALT 220*  --  179*  --  179*  --   --   --   --   ALKPHOS 180*  --  157*  --  172*  --   --   --   --   BILITOT 0.8  --  0.8  --  0.8  --   --   --   --   GFRNONAA 34*   < > 56*   < > 48*   < > 58* 59* 55*  GFRAA 39*   < > >60   < > 55*   < > >60 >60 >60  ANIONGAP 10   < > 9   < > 9   < > 8 7 9    < > = values in this interval not displayed.     Hematology Recent Labs  Lab 05/30/20 0416 05/31/20 0401 06/01/20 0322  WBC 11.3* 11.7* 11.6*   RBC 4.48 4.56 4.29  HGB 13.5 13.8 13.2  HCT 42.3 43.2 41.0  MCV 94.4 94.7 95.6  MCH 30.1 30.3 30.8  MCHC 31.9 31.9 32.2  RDW 14.5 14.6 14.6  PLT 141* 125* 106*    Cardiac EnzymesNo results for input(s): TROPONINI in the last 168 hours. No results for input(s): TROPIPOC in the last 168 hours.   BNPNo results for input(s): BNP, PROBNP in the last 168 hours.   DDimer No results for input(s): DDIMER in the last 168 hours.   Radiology    DG CHEST PORT 1 VIEW  Result Date: 05/31/2020 CLINICAL DATA:  Reason for exam: pleural effusion EXAM: PORTABLE CHEST - 1 VIEW COMPARISON:  05/28/2020 FINDINGS: Stable left IJ hemodialysis catheter to the proximal SVC. Elevation of the right diaphragmatic leaflet with probable small adjacent effusion. Small left effusion as before. Patchy ill-defined airspace opacities in the lung bases left greater than right as before. Heart size upper limits normal. Coronary stent. Aortic Atherosclerosis (ICD10-170.0). No pneumothorax. Visualized bones unremarkable. IMPRESSION: Stable small  pleural effusions and bibasilar airspace disease. Electronically Signed   By: Lucrezia Europe M.D.   On: 05/31/2020 07:48   ECHOCARDIOGRAM LIMITED  Result Date: 05/31/2020    ECHOCARDIOGRAM LIMITED REPORT   Patient Name:   Robert [name] Date of Exam: 05/31/2020 Medical Rec #:  [id number]      Height:       72.0 in Accession #:  [id number]     Weight:       223.7 lb Date of Birth:  08/12/1951      BSA:          2.234 m Patient Age:    69 years       BP:           99/65 mmHg Patient Gender: M              HR:           94 bpm. Exam Location:  Inpatient Procedure: Limited Color Doppler and Color Doppler Indications:    pericardial effusion 423.9  History:        Patient has prior history of Echocardiogram examinations, most                 recent 05/26/2020. COPD and chronic kidney disease; Risk                 Factors:Dyslipidemia and Diabetes.  Sonographer:    Johny Chess Referring Phys:  [id number] [place]  1. Left ventricular ejection fraction, by estimation, is 35 to 40%. The left ventricle has moderately decreased function. The left ventricle demonstrates global hypokinesis.  2. Trivial mitral valve regurgitation.  3. Tricuspid regurgitation signal is inadequate for assessing PA pressure.  4. The inferior vena cava is dilated in size with <50% respiratory variability, suggesting right atrial pressure of 15 mmHg.  5. There is no evidence of pericardial effusion. FINDINGS  Left Ventricle: Left ventricular ejection fraction, by estimation, is 35 to 40%. The left ventricle has moderately decreased function. The left ventricle demonstrates global hypokinesis. Right Ventricle: Tricuspid regurgitation signal is inadequate for assessing PA pressure. Pericardium: There is no evidence of pericardial effusion. Mitral Valve: Trivial mitral valve regurgitation. Venous: The inferior vena cava is dilated in size with less than 50% respiratory variability, suggesting right atrial pressure of 15 mmHg. IVC IVC diam: 2.60 cm Fransico Him MD Electronically signed by Fransico Him MD Signature Date/Time: 05/31/2020/9:59:44 AM    Final     Cardiac Studies   ECHO:  1. Left ventricular ejection fraction, by estimation, is 35 to 40%. The  left ventricle has moderately decreased function. The left ventricle  demonstrates global hypokinesis.  2. Trivial mitral valve regurgitation.  3. Tricuspid regurgitation signal is inadequate for assessing PA  pressure.  4. The inferior vena cava is dilated in size with <50% respiratory  variability, suggesting right atrial pressure of 15 mmHg.  5. There is no evidence of pericardial effusion.   Patient Profile     69 y.o. 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 He was previously on anticoagulation  for PAF but this was discontinued due to f/u heart monitoring showing NSR. He [facility] to [facility] with worsening SOB, weight gain and edema. Originally seen in ED 05/16/20 and sent home after Lasix but continued to worsen prompting return to ED9/04/2020. On arrival found to be hypoxic and with development of coarse atrial fib versus atypical atrial flutter. He was also found to have new LV dysfunction with LVEF 35%, moderate-large pericardial effusion, and AKI on CKD stage III with poor UOP.  Assessment & Plan    Pericardial effusion/tamponade:  Status post pericardiocentesis with 1 liter of fluid removed and drain subsequently removed.  Hemorrhagic effusion.  No accumulation (minimal) on echo.  Continue volume management.    Acute systolic HF:  He is net negative but still has significant volume overload.    Atrial fib:   On amiodarone and beta blocker.   Hold off on oral anticoagulation as this was a frankly bloody effusion.    I hold of on DOAC and change to DVT prophylaxis heparin.  If in a month there is no re accumulation of fluid and he is further out from his documented bloody effusion and AKI with possible platelet dysfunction, I would suggest DOAC.    CAD:  Not pursuing invasive evaluation with renal insufficiency.  Consider out patient ischemia work up.     AKI:   Minimal urine output.  Volume managed through CRRT.    PAD:  Medical management perr vascular he has known hi-grade stenosis in L SFA and popliteal artery occlusion with reconstitution via collaterals per [place] in 2018.  Continue medical management.    For questions or updates, please contact [place] Please consult [email or web address] for contact info under Cardiology/STEMI.   Signed, Minus Breeding, MD  06/01/2020, 8:06 AM

## 2020-06-01 NOTE — Progress Notes (Addendum)
[place] for heparin Indication: atrial fibrillation  Allergies  Allergen Reactions  . Ivp Dye [Iodinated Diagnostic Agents] Itching and Rash    Patient Measurements: Height: 6' (182.9 cm) Weight: 101.5 kg (223 lb 11.2 oz) IBW/kg (Calculated) : 77.6 Heparin Dosing Weight: 103  Vital Signs: Temp: 97.1 F (36.2 C) (09/21 0800) Temp Source: Oral (09/21 0400) BP: 100/67 (09/21 0900) Pulse Rate: 102 (09/21 0900)  Labs: Recent Labs    05/29/20 1011 05/29/20 1541 05/29/20 1921 05/29/20 1921 05/30/20 0416 05/30/20 1605 05/31/20 0401 05/31/20 1528 06/01/20 0322  HGB  --   --   --    < > 13.5  --  13.8  --  13.2  HCT  --   --   --   --  42.3  --  43.2  --  41.0  PLT  --   --   --   --  141*  --  125*  --  106*  APTT 37*  --  52*  --   --   --   --   --   --   HEPARINUNFRC 0.22*  --  0.39   < > 0.50  --  0.28*  --  0.38  CREATININE  --    < >  --    < > 1.49*   < > 1.28* 1.26* 1.32*   < > = values in this interval not displayed.    Estimated Creatinine Clearance: 67 mL/min (A) (by C-G formula based on SCr of 1.32 mg/dL (H)).   Assessment: 63 yoM on apixaban PTA for hx AFib admitted with pericardial tamponade s/p pericardiocentesis. Anticoagulation has been held, drain now removed 9/16. Pharmacy asked to restart IV heparin with no bolus, will target lower goal. Last dose of apixaban given 9/13 a.m.   Heparin level at goal at 0.38 this morning. Oozing from CRRT line noted.  Hgb stable 13s, plt trending down to 106.  Heparin drip has now been stopped by cardiology, recommends f/up in a month for possible initiation of DOAC.   Goal of Therapy:  Heparin level 0.3-0.5 units/ml Monitor platelets by anticoagulation protocol: Yes   Plan:  Will sign off and follow peripherally  Erin Hearing PharmD., BCPS Clinical Pharmacist 06/01/2020 9:42 AM  [facility] pharmacy phone numbers are listed on [email or web address]

## 2020-06-01 NOTE — Progress Notes (Signed)
Patient ID: TENNIS MCKINNON, male   DOB: 08/18/1951, 69 y.o.   MRN: [id number] S: Feels well, no complaints.  O:BP 91/69   Pulse 98   Temp (!) 97.1 F (36.2 C)   Resp 19   Ht 6' (1.829 m)   Wt 101.5 kg   SpO2 96%   BMI 30.34 kg/m   Intake/Output Summary (Last 24 hours) at 06/01/2020 1018 Last data filed at 06/01/2020 1000 Gross per 24 hour  Intake 1270.59 ml  Output 4671 ml  Net -3400.41 ml   Intake/Output: I/O last 3 completed shifts: In: 1528.1 [P.O.:1075; I.V.:453.1] Out: 5102 [Other:6397]  Intake/Output this shift:  Total I/O In: 397.9 [P.O.:360; I.V.:37.9] Out: 808 [Other:808] Weight change:  Gen: NAD CVS: RRR Resp: CTA Abd: +BS, soft, NT/Nd Ext: 2+ edema to hips bilaterally.  Recent Labs  Lab 05/26/20 0054 05/26/20 0054 05/27/20 0548 05/27/20 0548 05/28/20 0030 05/28/20 1623 05/29/20 0343 05/29/20 1541 05/30/20 0416 05/30/20 1605 05/31/20 0401 05/31/20 1528 06/01/20 0322  NA 132*   < > 133*   < > 134*   < > 134* 135 135 135 135 134* 135  K 5.4*   < > 5.3*   < > 4.0   < > 4.2 4.5 4.2 4.8 4.3 4.4 4.5  CL 98   < > 97*   < > 95*   < > 99 101 101 102 102 101 101  CO2 26   < > 26   < > 29   < > 26 28 25 27 25 26 25   GLUCOSE 333*   < > 229*   < > 131*   < > 170* 170* 128* 127* 128* 133* 167*  BUN 114*   < > 119*   < > 120*   < > 78* 58* 51* 38* 29* 24* 19  CREATININE 2.18*   < > 2.20*   < > 1.98*   < > 1.31* 1.21 1.49* 1.32* 1.28* 1.26* 1.32*  ALBUMIN 2.6*   < > 2.8*   < > 2.9*   < > 2.9* 2.9* 2.9* 2.9* 3.0* 3.0* 3.0*  CALCIUM 8.6*   < > 8.6*   < > 8.9   < > 8.5* 8.3* 8.2* 8.4* 8.5* 8.5* 8.6*  PHOS  --   --   --   --   --    < > 2.9 2.3* 2.8 2.3* 2.4* 2.2* 2.7  AST 115*  --  130*  --  92*  --  79*  --  76*  --   --   --   --   ALT 230*  --  226*  --  220*  --  179*  --  179*  --   --   --   --    < > = values in this interval not displayed.   Liver Function Tests: Recent Labs  Lab 05/28/20 0030 05/28/20 1623 05/29/20 0343 05/29/20 1541 05/30/20 0416  05/30/20 1605 05/31/20 0401 05/31/20 1528 06/01/20 0322  AST 92*  --  79*  --  76*  --   --   --   --   ALT 220*  --  179*  --  179*  --   --   --   --   ALKPHOS 180*  --  157*  --  172*  --   --   --   --   BILITOT 0.8  --  0.8  --  0.8  --   --   --   --  PROT 6.2*  --  6.1*  --  6.4*  --   --   --   --   ALBUMIN 2.9*   < > 2.9*   < > 2.9*   < > 3.0* 3.0* 3.0*   < > = values in this interval not displayed.   No results for input(s): LIPASE, AMYLASE in the last 168 hours. No results for input(s): AMMONIA in the last 168 hours. CBC: Recent Labs  Lab 05/28/20 0030 05/28/20 0030 05/29/20 0343 05/29/20 0343 05/30/20 0416 05/31/20 0401 06/01/20 0322  WBC 10.8*   < > 8.5   < > 11.3* 11.7* 11.6*  HGB 14.1   < > 13.3   < > 13.5 13.8 13.2  HCT 43.2   < > 41.4   < > 42.3 43.2 41.0  MCV 94.5  --  93.9  --  94.4 94.7 95.6  PLT 264   < > 177   < > 141* 125* 106*   < > = values in this interval not displayed.   Cardiac Enzymes: No results for input(s): CKTOTAL, CKMB, CKMBINDEX, TROPONINI in the last 168 hours. CBG: Recent Labs  Lab 05/31/20 0600 05/31/20 1113 05/31/20 1543 05/31/20 2149 06/01/20 0627  GLUCAP 126* 260* 124* 147* 160*    Iron Studies: No results for input(s): IRON, TIBC, TRANSFERRIN, FERRITIN in the last 72 hours. Studies/Results: DG CHEST PORT 1 VIEW  Result Date: 05/31/2020 CLINICAL DATA:  Reason for exam: pleural effusion EXAM: PORTABLE CHEST - 1 VIEW COMPARISON:  05/28/2020 FINDINGS: Stable left IJ hemodialysis catheter to the proximal SVC. Elevation of the right diaphragmatic leaflet with probable small adjacent effusion. Small left effusion as before. Patchy ill-defined airspace opacities in the lung bases left greater than right as before. Heart size upper limits normal. Coronary stent. Aortic Atherosclerosis (ICD10-170.0). No pneumothorax. Visualized bones unremarkable. IMPRESSION: Stable small  pleural effusions and bibasilar airspace disease.  Electronically Signed   By: Lucrezia Europe M.D.   On: 05/31/2020 07:48   ECHOCARDIOGRAM LIMITED  Result Date: 05/31/2020    ECHOCARDIOGRAM LIMITED REPORT   Patient Name:   GILLIAN KLUEVER Date of Exam: 05/31/2020 Medical Rec #:  [id number]      Height:       72.0 in Accession #:    [id number]     Weight:       223.7 lb Date of Birth:  [birth year]      BSA:          2.234 m Patient Age:    43 years       BP:           99/65 mmHg Patient Gender: M              HR:           94 bpm. Exam Location:  Inpatient Procedure: Limited Color Doppler and Color Doppler Indications:    pericardial effusion 423.9  History:        Patient has prior history of Echocardiogram examinations, most                 recent 05/26/2020. COPD and chronic kidney disease; Risk                 Factors:Dyslipidemia and Diabetes.  Sonographer:    Johny Chess Referring Phys: [id number] [place]  1. Left ventricular ejection fraction, by estimation, is 35 to 40%. The left ventricle has moderately decreased function. The left ventricle demonstrates  global hypokinesis.  2. Trivial mitral valve regurgitation.  3. Tricuspid regurgitation signal is inadequate for assessing PA pressure.  4. The inferior vena cava is dilated in size with <50% respiratory variability, suggesting right atrial pressure of 15 mmHg.  5. There is no evidence of pericardial effusion. FINDINGS  Left Ventricle: Left ventricular ejection fraction, by estimation, is 35 to 40%. The left ventricle has moderately decreased function. The left ventricle demonstrates global hypokinesis. Right Ventricle: Tricuspid regurgitation signal is inadequate for assessing PA pressure. Pericardium: There is no evidence of pericardial effusion. Mitral Valve: Trivial mitral valve regurgitation. Venous: The inferior vena cava is dilated in size with less than 50% respiratory variability, suggesting right atrial pressure of 15 mmHg. IVC IVC diam: 2.60 cm Fransico Him MD Electronically  signed by Fransico Him MD Signature Date/Time: 05/31/2020/9:59:44 AM    Final    . amiodarone  200 mg Oral BID  . B-complex with vitamin C  1 tablet Oral Daily  . Chlorhexidine Gluconate Cloth  6 each Topical Daily  . dextromethorphan-guaiFENesin  1 tablet Oral BID  . feeding supplement (ENSURE ENLIVE)  237 mL Oral BID BM  . fluticasone  1 spray Each Nare Daily  . folic acid  1 mg Oral Daily  . gabapentin  300 mg Oral TID  . heparin  5,000 Units Subcutaneous Q8H  . influenza vaccine adjuvanted  0.5 mL Intramuscular Tomorrow-1000  . insulin aspart  0-20 Units Subcutaneous TID WC  . insulin aspart  6 Units Subcutaneous TID WC  . insulin glargine  10 Units Subcutaneous Daily  . ipratropium-albuterol  3 mL Nebulization BID  . lidocaine-EPINEPHrine  20 mL Intradermal Once  . melatonin  3 mg Oral QHS  . metoprolol tartrate  25 mg Oral BID  . mometasone-formoterol  2 puff Inhalation BID  . primidone  100 mg Oral QHS  . sodium chloride flush  10-40 mL Intracatheter Q12H  . sodium chloride flush  3 mL Intravenous Q12H  . sodium chloride flush  3 mL Intravenous Q12H  . tamsulosin  0.4 mg Oral Daily    BMET    Component Value Date/Time   NA 135 06/01/2020 0322   NA 135 10/17/2019 1628   K 4.5 06/01/2020 0322   CL 101 06/01/2020 0322   CO2 25 06/01/2020 0322   GLUCOSE 167 (H) 06/01/2020 0322   BUN 19 06/01/2020 0322   BUN 34 (H) 10/17/2019 1628   CREATININE 1.32 (H) 06/01/2020 0322   CALCIUM 8.6 (L) 06/01/2020 0322   GFRNONAA 55 (L) 06/01/2020 0322   GFRAA >60 06/01/2020 0322   CBC    Component Value Date/Time   WBC 11.6 (H) 06/01/2020 0322   RBC 4.29 06/01/2020 0322   HGB 13.2 06/01/2020 0322   HGB 16.3 09/25/2017 1135   HCT 41.0 06/01/2020 0322   HCT 47.1 09/25/2017 1135   PLT 106 (L) 06/01/2020 0322   PLT 159 09/25/2017 1135   MCV 95.6 06/01/2020 0322   MCV 91 09/25/2017 1135   MCH 30.8 06/01/2020 0322   MCHC 32.2 06/01/2020 0322   RDW 14.6 06/01/2020 0322   RDW 14.5  09/25/2017 1135   LYMPHSABS 0.6 (L) 05/20/2020 0629   MONOABS 1.1 (H) 05/20/2020 0629   EOSABS 0.0 05/20/2020 0629   BASOSABS 0.0 05/20/2020 0629    Assessment/Plan:  1. AKI/CKD stage 3b- in setting of acute decompensated CHF with pericardial effusion.  Sub-optimal response to IV diuretics and started on CRRT 05/28/20 for better UF.  No  significant UOP since starting CRRT.  Had been producing over 2.5 liters with IV diuretics.   1. Continue with CRRT and UF at 100-150 ml/hr 2. Urinary retention- cont with flomax. 3. Acute on chronic systolic CHF- cardiology following.  Holding off on IV contrast due to AKI/CKD 4. Pericardial effusion s/p pericardiocentesis with improvement 5. Acute hypoxic respiratory failure due to CHF.  Improved with volume removal 6. Hyponatremia- due to CHF, improving with CRRT 7. COPD 8. DM type 2 per primary 9. A fib- currently rate controlled and on anticoagulation.  Per cardiology. Donetta Potts, MD [employer] [phone]

## 2020-06-01 NOTE — Progress Notes (Signed)
Nephrology made aware filter pressure increased  To 350 less than one hour of changing filter. Heparin gtt stopped by Cardiology earlier in the day. MD made aware and new orders received. Will continue to monitor.

## 2020-06-02 LAB — CBC
HCT: 41.6 % (ref 39.0–52.0)
Hemoglobin: 13.1 g/dL (ref 13.0–17.0)
MCH: 30.3 pg (ref 26.0–34.0)
MCHC: 31.5 g/dL (ref 30.0–36.0)
MCV: 96.3 fL (ref 80.0–100.0)
Platelets: 75 10*3/uL — ABNORMAL LOW (ref 150–400)
RBC: 4.32 MIL/uL (ref 4.22–5.81)
RDW: 14.8 % (ref 11.5–15.5)
WBC: 17.3 10*3/uL — ABNORMAL HIGH (ref 4.0–10.5)
nRBC: 0 % (ref 0.0–0.2)

## 2020-06-02 LAB — RENAL FUNCTION PANEL
Albumin: 3 g/dL — ABNORMAL LOW (ref 3.5–5.0)
Albumin: 2.9 g/dL — ABNORMAL LOW (ref 3.5–5.0)
Anion gap: 7 (ref 5–15)
Anion gap: 6 (ref 5–15)
BUN: 22 mg/dL (ref 8–23)
BUN: 28 mg/dL — ABNORMAL HIGH (ref 8–23)
CO2: 24 mmol/L (ref 22–32)
CO2: 25 mmol/L (ref 22–32)
Calcium: 8 mg/dL — ABNORMAL LOW (ref 8.9–10.3)
Calcium: 8.4 mg/dL — ABNORMAL LOW (ref 8.9–10.3)
Chloride: 103 mmol/L (ref 98–111)
Chloride: 100 mmol/L (ref 98–111)
Creatinine, Ser: 1.7 mg/dL — ABNORMAL HIGH (ref 0.61–1.24)
Creatinine, Ser: 2.08 mg/dL — ABNORMAL HIGH (ref 0.61–1.24)
GFR calc Af Amer: 47 mL/min — ABNORMAL LOW (ref >60)
GFR calc Af Amer: 37 mL/min — ABNORMAL LOW (ref >60)
GFR calc non Af Amer: 41 mL/min — ABNORMAL LOW (ref >60)
GFR calc non Af Amer: 32 mL/min — ABNORMAL LOW (ref >60)
Glucose, Bld: 185 mg/dL — ABNORMAL HIGH (ref 70–99)
Glucose, Bld: 267 mg/dL — ABNORMAL HIGH (ref 70–99)
Phosphorus: 2.9 mg/dL (ref 2.5–4.6)
Phosphorus: 3.4 mg/dL (ref 2.5–4.6)
Potassium: 5.3 mmol/L — ABNORMAL HIGH (ref 3.5–5.1)
Potassium: 5.5 mmol/L — ABNORMAL HIGH (ref 3.5–5.1)
Sodium: 134 mmol/L — ABNORMAL LOW (ref 135–145)
Sodium: 131 mmol/L — ABNORMAL LOW (ref 135–145)

## 2020-06-02 LAB — POCT I-STAT EG7
Acid-base deficit: 1 mmol/L (ref 0.0–2.0)
Bicarbonate: 28.3 mmol/L — ABNORMAL HIGH (ref 20.0–28.0)
Calcium, Ion: 1.24 mmol/L (ref 1.15–1.40)
HCT: 45 % (ref 39.0–52.0)
Hemoglobin: 15.3 g/dL (ref 13.0–17.0)
O2 Saturation: 44 %
Potassium: 4.5 mmol/L (ref 3.5–5.1)
Sodium: 137 mmol/L (ref 135–145)
TCO2: 30 mmol/L (ref 22–32)
pCO2, Ven: 65.2 mmHg — ABNORMAL HIGH (ref 44.0–60.0)
pH, Ven: 7.246 — ABNORMAL LOW (ref 7.250–7.430)
pO2, Ven: 29 mmHg — CL (ref 32.0–45.0)

## 2020-06-02 LAB — POCT ACTIVATED CLOTTING TIME
Activated Clotting Time: 175 seconds
Activated Clotting Time: 175 seconds
Activated Clotting Time: 191 seconds
Activated Clotting Time: 202 seconds
Activated Clotting Time: 169 seconds
Activated Clotting Time: 180 seconds
Activated Clotting Time: 180 seconds
Activated Clotting Time: 186 seconds
Activated Clotting Time: 175 seconds
Activated Clotting Time: 191 seconds
Activated Clotting Time: 180 seconds
Activated Clotting Time: 191 seconds
Activated Clotting Time: 202 seconds

## 2020-06-02 LAB — GLUCOSE, CAPILLARY
Glucose-Capillary: 172 mg/dL — ABNORMAL HIGH (ref 70–99)
Glucose-Capillary: 222 mg/dL — ABNORMAL HIGH (ref 70–99)
Glucose-Capillary: 254 mg/dL — ABNORMAL HIGH (ref 70–99)
Glucose-Capillary: 166 mg/dL — ABNORMAL HIGH (ref 70–99)

## 2020-06-02 LAB — APTT: aPTT: 61 seconds — ABNORMAL HIGH (ref 24–36)

## 2020-06-02 LAB — MAGNESIUM: Magnesium: 2.6 mg/dL — ABNORMAL HIGH (ref 1.7–2.4)

## 2020-06-02 MED ORDER — HEPARIN SODIUM (PORCINE) 1000 UNIT/ML DIALYSIS
1000.0000 [IU] | INTRAMUSCULAR | Status: DC | PRN
  Administered 2020-06-02: 3000 [IU] via INTRAVENOUS_CENTRAL
  Filled 2020-06-02 (×3): qty 6

## 2020-06-02 MED ORDER — FUROSEMIDE 10 MG/ML IJ SOLN
120.0000 mg | Freq: Two times a day (BID) | INTRAVENOUS | Status: DC
  Administered 2020-06-03: 120 mg via INTRAVENOUS
  Filled 2020-06-02 (×3): qty 12

## 2020-06-02 MED ORDER — INSULIN GLARGINE 100 UNIT/ML ~~LOC~~ SOLN
20.0000 [IU] | Freq: Every day | SUBCUTANEOUS | Status: DC
  Administered 2020-06-02: 20 [IU] via SUBCUTANEOUS
  Filled 2020-06-02 (×2): qty 0.2

## 2020-06-02 MED ORDER — INSULIN GLARGINE 100 UNIT/ML ~~LOC~~ SOLN: 20.0000 [IU] | Freq: Every day | SUBCUTANEOUS | Status: DC

## 2020-06-02 NOTE — Progress Notes (Signed)
NAME:  Robert [name], MRN:  [id number], DOB:  03-18-1951, LOS: 31 ADMISSION DATE:  05/23/2020, CONSULTATION DATE:  05/28/2020 REFERRING MD:  Dr. Teryl Lucy, CHIEF COMPLAINT:  Hypoxic    Brief History   69 year old gentleman past medical history of hypertension, hyperlipidemia, COPD, congestive heart failure, CKD stage IIIb.  Patient also has new systolic and diastolic cardiomyopathy, acute on chronic with ejection fraction 35 to 40%.  Patient with increasing oxygen requirements.  New enlarging pleural effusion.  Decision made for need of CVVHD after discussion with nephrology.  Pulmonary critical care consulted.  History of present illness   This is a 69 year old gentleman past medical history of hypertension hyperlipidemia, COPD, congestive heart failure, CKD 3B.  New systolic diastolic cardiomyopathy with an EF of 35 to 40%.  Patient has been on maximal diuretics with tapering urine output.  He had declined HD over the past couple of days hoping that the diuretics would continue to work.  Unfortunately had increasing oxygen requirements.  Chest x-ray completed today with a large right-sided pleural effusion as well as fluid retention in his bilateral lower extremities and pulmonary vascular congestion.  Decision was made for consultation pulmonary critical care due to increasing oxygen requirements and consideration for CVVHD.  I discussed case with nephrology.  They are in agreement to initiate CVVHD at this time.  Past Medical History   Past Medical History:  Diagnosis Date  . Anxiety   . CHF (congestive heart failure) ([place])   . Collagen vascular disease ([place])   . COPD (chronic obstructive pulmonary disease) ([place])   . Coronary artery disease   . COVID-19   . Full dentures   . GERD (gastroesophageal reflux disease)    Rolaids as needed  . High cholesterol   . History of MI (myocardial infarction)   . Hypertension    med. dosage increased 04/2016; has been on BP med. x 10 yrs.  . Incarcerated  umbilical hernia [id number]  . Inguinal hernia 05/2016   bilateral   . Insulin dependent diabetes mellitus   . Ischemic cardiomyopathy    a. 03/2015 EF 40-45% by LV gram.  . Left leg cellulitis 10/08/2016  . PAF (paroxysmal atrial fibrillation) (H[facility])    on home amio+metoprolol, plavix  . Rheumatoid arthritis(714.0)      Significant Hospital Events   ICU admission  Consults:  Pulmonary critical care Cardiology Nephrology Hospitalist  Procedures:  05/28/2020: HD catheter  Significant Diagnostic Tests:    Micro Data:    Antimicrobials:  Pericardial fluid: NGTD Blood cx: NGTD MRSA PCR neg COVID neg  Interim history/subjective:  No events.  States breathing and energy levels improving.   Objective   Blood pressure (!) 80/51, pulse (!) 105, temperature (!) 97.5 F (36.4 C), temperature source Oral, resp. rate 19, height 6' (1.829 m), weight 96.9 kg, SpO2 96 %.        Intake/Output Summary (Last 24 hours) at 06/02/2020 0806 Last data filed at 06/02/2020 0800 Gross per 24 hour  Intake 1074.91 ml  Output 4197 ml  Net -3122.09 ml   Filed Weights   05/30/20 0500 05/31/20 0500 06/02/20 0600  Weight: 104.6 kg 101.5 kg 96.9 kg    Examination: Constitutional: chronically ill man in no acute distress Eyes: eyes are anicteric, reactive to light Ears, nose, mouth, and throat: mucous membranes moist, trachea midline, malampatti 4 Cardiovascular: heart sounds are irregular, ext are warm to touch. 3+ edema Respiratory: Diminished at bases with crackles on R Gastrointestinal: abdomen is soft  with + BS Skin: No rashes, normal turgor Neurologic: moves all 4 ext to command, weak Psychiatric: RASS 0, good insight   Pressure Injury 07/04/19 Buttocks Right;Upper Stage II -  Partial thickness loss of dermis presenting as a shallow open ulcer with a red, pink wound bed without slough. (Active)  07/04/19 1429  Location: Buttocks  Location Orientation: Right;Upper  Staging: Stage II  -  Partial thickness loss of dermis presenting as a shallow open ulcer with a red, pink wound bed without slough.  Wound Description (Comments):   Present on Admission: Yes     Pressure Injury 05/26/20 Foot Left;Lateral Deep Tissue Pressure Injury - Purple or maroon localized area of discolored intact skin or blood-filled blister due to damage of underlying soft tissue from pressure and/or shear. dark purple circular, 5cm x 5 c (Active)  05/26/20 0820  Location: Foot  Location Orientation: Left;Lateral  Staging: Deep Tissue Pressure Injury - Purple or maroon localized area of discolored intact skin or blood-filled blister due to damage of underlying soft tissue from pressure and/or shear.  Wound Description (Comments): dark purple circular, 5cm x 5 cm  Present on Admission: Yes   WBC up HgB stable Plts down  Resolved Hospital Problem list     Assessment & Plan:   Acute hypoxemic respiratory failure requiring high flow nasal cannula. Bilateral pulmonary edema, vascular congestion Bilateral pleural effusions Positive cumulative fluid balance - Continue to pull with CRRT - Plans to convert to iHD once filter is done  Acute on chronic systolic diastolic heart failure Pericardial effusion w/ tamponade status post pericardiocentesis -Goal-directed heart failure regimen per cardiology services -Continue beta-blocker -Volume removal CVVHD. -Check chest with [place] today  AKI on CKD stage IIIb. Hyperkalemia Hyponatremia - Plans to convert to iHD once filter is done  Urinary retention Now anuric -Continue PTA Flomax  A. 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  Muscular deconditioning- PT/OT consults  Best practice:  Diet: Regular Pain/Anxiety/Delirium  protocol (if indicated): N/A VAP protocol (if indicated): N/A DVT prophylaxis: Heparin  GI prophylaxis: None PPI Glucose control: SSI Mobility: Bedrest Code Status: Full code Family Communication: team to update family  Disposition: ICU.  The patient is critically ill with multiple organ systems failure and requires high complexity decision making for assessment and support, frequent evaluation and titration of therapies, application of advanced monitoring technologies and extensive interpretation of multiple databases. Critical Care Time devoted to patient care services described in this note independent of APP/resident time (if applicable)  is 38 minutes.   Erskine Emery MD [facility] 06/02/2020 8:15 AM Personal pager: [phone] If unanswered, please page CCM On-call: [phone]

## 2020-06-02 NOTE — Progress Notes (Signed)
Progress Note    06/02/2020 9:19 AM 8 Days Post-Op  Subjective:  currently without c/o pain. OOB to chair. No lower extremity rest pain   Vitals:   06/02/20 0831 06/02/20 0900  BP:  (!) 88/74  Pulse: (!) 106 94  Resp: (!) 21 14  Temp:    SpO2: 96% 94%    Physical Exam: Cardiac:  Reg rate, irreg rhythm  Lungs:  nonlabored Extremities:  Marked edema bilaterally with bullous lesion to left foot an ankle. Chronic left foot motor dysfunction Brisk right DP, PT and peroneal art Doppler signals Dampened monophasic left DP and PT Doppler signals   CBC    Component Value Date/Time   WBC 17.3 (H) 06/02/2020 0338   RBC 4.32 06/02/2020 0338   HGB 13.1 06/02/2020 0338   HGB 16.3 09/25/2017 1135   HCT 41.6 06/02/2020 0338   HCT 47.1 09/25/2017 1135   PLT 75 (L) 06/02/2020 0338   PLT 159 09/25/2017 1135   MCV 96.3 06/02/2020 0338   MCV 91 09/25/2017 1135   MCH 30.3 06/02/2020 0338   MCHC 31.5 06/02/2020 0338   RDW 14.8 06/02/2020 0338   RDW 14.5 09/25/2017 1135   LYMPHSABS 0.6 (L) 05/20/2020 0629   MONOABS 1.1 (H) 05/20/2020 0629   EOSABS 0.0 05/20/2020 0629   BASOSABS 0.0 05/20/2020 0629    BMET    Component Value Date/Time   NA 134 (L) 06/02/2020 0338   NA 135 10/17/2019 1628   K 5.3 (H) 06/02/2020 0338   CL 103 06/02/2020 0338   CO2 24 06/02/2020 0338   GLUCOSE 185 (H) 06/02/2020 0338   BUN 22 06/02/2020 0338   BUN 34 (H) 10/17/2019 1628   CREATININE 1.70 (H) 06/02/2020 0338   CALCIUM 8.0 (L) 06/02/2020 0338   GFRNONAA 41 (L) 06/02/2020 0338   GFRAA 47 (L) 06/02/2020 0338     Intake/Output Summary (Last 24 hours) at 06/02/2020 0919 Last data filed at 06/02/2020 0900 Gross per 24 hour  Intake 1161.93 ml  Output 4167 ml  Net -3005.07 ml    HOSPITAL MEDICATIONS Scheduled Meds: . amiodarone  200 mg Oral BID  . B-complex with vitamin C  1 tablet Oral Daily  . Chlorhexidine Gluconate Cloth  6 each Topical Daily  . dextromethorphan-guaiFENesin  1 tablet  Oral BID  . feeding supplement (ENSURE ENLIVE)  237 mL Oral BID BM  . fluticasone  1 spray Each Nare Daily  . folic acid  1 mg Oral Daily  . gabapentin  300 mg Oral TID  . heparin  5,000 Units Subcutaneous Q8H  . influenza vaccine adjuvanted  0.5 mL Intramuscular Tomorrow-1000  . insulin aspart  0-20 Units Subcutaneous TID WC  . insulin aspart  6 Units Subcutaneous TID WC  . insulin glargine  10 Units Subcutaneous Daily  . ipratropium-albuterol  3 mL Nebulization BID  . melatonin  3 mg Oral QHS  . metoprolol tartrate  25 mg Oral BID  . mometasone-formoterol  2 puff Inhalation BID  . primidone  100 mg Oral QHS  . sodium chloride flush  10-40 mL Intracatheter Q12H  . sodium chloride flush  3 mL Intravenous Q12H  . sodium chloride flush  3 mL Intravenous Q12H  . tamsulosin  0.4 mg Oral Daily   Continuous Infusions: .  prismasol BGK 4/2.5 500 mL/hr at 06/02/20 0344  .  prismasol BGK 4/2.5 300 mL/hr at 06/02/20 0100  . sodium chloride    . sodium chloride    .  heparin 10,000 units/ 20 mL infusion syringe 1,550 Units/hr (06/02/20 0908)  . prismasol BGK 4/2.5 1,800 mL/hr at 06/02/20 0740   PRN Meds:.sodium chloride, sodium chloride, acetaminophen, albuterol, heparin, loperamide, Muscle Rub, ondansetron (ZOFRAN) IV, oxyCODONE, sodium chloride flush, sodium chloride flush, sodium chloride flush  Assessment: Chronic LLE arterial disease, likely SFA occlusion, with initiation of CRRT secondary to acute kidney injury atop CKD this admission.  S/p pericardiocentesis of frankly bloody fluid. Hx a. Fib, DOAC held due to bloody pericardial effusion.  Plan: -Angiography with intervention when medically optimized>>inpt vs outpt -DVT prophylaxis:  heparin   Risa Grill, PA-C [facility] [phone] 06/02/2020  9:19 AM

## 2020-06-02 NOTE — Progress Notes (Signed)
Progress Note  Patient Name: Robert [name] Date of Encounter: 06/02/2020  Primary Cardiologist:   Sanda Klein, MD   Subjective   He feels good.  Weight is back to his baseline.  No pain.   Inpatient Medications    Scheduled Meds: . amiodarone  200 mg Oral BID  . B-complex with vitamin C  1 tablet Oral Daily  . Chlorhexidine Gluconate Cloth  6 each Topical Daily  . dextromethorphan-guaiFENesin  1 tablet Oral BID  . feeding supplement (ENSURE ENLIVE)  237 mL Oral BID BM  . fluticasone  1 spray Each Nare Daily  . folic acid  1 mg Oral Daily  . gabapentin  300 mg Oral TID  . heparin  5,000 Units Subcutaneous Q8H  . influenza vaccine adjuvanted  0.5 mL Intramuscular Tomorrow-1000  . insulin aspart  0-20 Units Subcutaneous TID WC  . insulin aspart  6 Units Subcutaneous TID WC  . insulin glargine  10 Units Subcutaneous Daily  . ipratropium-albuterol  3 mL Nebulization BID  . melatonin  3 mg Oral QHS  . metoprolol tartrate  25 mg Oral BID  . mometasone-formoterol  2 puff Inhalation BID  . primidone  100 mg Oral QHS  . sodium chloride flush  10-40 mL Intracatheter Q12H  . sodium chloride flush  3 mL Intravenous Q12H  . sodium chloride flush  3 mL Intravenous Q12H  . tamsulosin  0.4 mg Oral Daily   Continuous Infusions: .  prismasol BGK 4/2.5 500 mL/hr at 06/02/20 0344  .  prismasol BGK 4/2.5 300 mL/hr at 06/02/20 0100  . sodium chloride    . sodium chloride    . heparin 10,000 units/ 20 mL infusion syringe 1,550 Units/hr (06/02/20 0908)  . prismasol BGK 4/2.5 1,800 mL/hr at 06/02/20 0740   PRN Meds: sodium chloride, sodium chloride, acetaminophen, albuterol, heparin, loperamide, Muscle Rub, ondansetron (ZOFRAN) IV, oxyCODONE, sodium chloride flush, sodium chloride flush, sodium chloride flush   Vital Signs    Vitals:   06/02/20 0700 06/02/20 0800 06/02/20 0831 06/02/20 0900  BP: (!) 80/51 (!) 87/72  (!) 88/74  Pulse: (!) 105 95 (!) 106 94  Resp: 19 18 (!) 21 14   Temp:      TempSrc:      SpO2: 96%  96% 94%  Weight:      Height:        Intake/Output Summary (Last 24 hours) at 06/02/2020 0915 Last data filed at 06/02/2020 0900 Gross per 24 hour  Intake 1161.93 ml  Output 4167 ml  Net -3005.07 ml   Filed Weights   05/30/20 0500 05/31/20 0500 06/02/20 0600  Weight: 104.6 kg 101.5 kg 96.9 kg    Telemetry    Atrial fib rate controlled - Personally Reviewed  ECG    NA - Personally Reviewed  Physical Exam   GEN: No  acute distress.   Neck: No  JVD Cardiac: Irregular RR, no murmurs, rubs, or gallops.  Respiratory: Clear   to auscultation bilaterally. GI: Soft, nontender, non-distended, normal bowel sounds  MS:  Moderately severe edema; No deformity. Neuro:   Nonfocal  Psych: Oriented and appropriate     Labs    Chemistry Recent Labs  Lab 05/28/20 0030 05/28/20 1623 05/29/20 0343 05/29/20 1541 05/30/20 0416 05/30/20 1605 06/01/20 0322 06/01/20 0322 06/01/20 1600 06/01/20 2256 06/02/20 0338  NA 134*   < > 134*   < > 135   < > 135   < > 131* 137 134*  K 4.0   < > 4.2   < > 4.2   < > 4.5   < > 4.8 4.5 5.3*  CL 95*   < > 99   < > 101   < > 101  --  98  --  103  CO2 29   < > 26   < > 25   < > 25  --  25  --  24  GLUCOSE 131*   < > 170*   < > 128*   < > 167*  --  335*  --  185*  BUN 120*   < > 78*   < > 51*   < > 19  --  24*  --  22  CREATININE 1.98*   < > 1.31*   < > 1.49*   < > 1.32*  --  1.76*  --  1.70*  CALCIUM 8.9   < > 8.5*   < > 8.2*   < > 8.6*  --  8.1*  --  8.0*  PROT 6.2*  --  6.1*  --  6.4*  --   --   --   --   --   --   ALBUMIN 2.9*   < > 2.9*   < > 2.9*   < > 3.0*  --  2.8*  --  3.0*  AST 92*  --  79*  --  76*  --   --   --   --   --   --   ALT 220*  --  179*  --  179*  --   --   --   --   --   --   ALKPHOS 180*  --  157*  --  172*  --   --   --   --   --   --   BILITOT 0.8  --  0.8  --  0.8  --   --   --   --   --   --   GFRNONAA 34*   < > 56*   < > 48*   < > 55*  --  39*  --  41*  GFRAA 39*   < > >60   <  > 55*   < > >60  --  45*  --  47*  ANIONGAP 10   < > 9   < > 9   < > 9  --  8  --  7   < > = values in this interval not displayed.     Hematology Recent Labs  Lab 05/31/20 0401 05/31/20 0401 06/01/20 0322 06/01/20 2256 06/02/20 0338  WBC 11.7*  --  11.6*  --  17.3*  RBC 4.56  --  4.29  --  4.32  HGB 13.8   < > 13.2 15.3 13.1  HCT 43.2   < > 41.0 45.0 41.6  MCV 94.7  --  95.6  --  96.3  MCH 30.3  --  30.8  --  30.3  MCHC 31.9  --  32.2  --  31.5  RDW 14.6  --  14.6  --  14.8  PLT 125*  --  106*  --  75*   < > = values in this interval not displayed.    Cardiac EnzymesNo results for input(s): TROPONINI in the last 168 hours. No results for input(s): TROPIPOC in the last 168 hours.   BNPNo results for input(s): BNP, PROBNP  in the last 168 hours.   DDimer No results for input(s): DDIMER in the last 168 hours.   Radiology    ECHOCARDIOGRAM LIMITED  Result Date: 05/31/2020    ECHOCARDIOGRAM LIMITED REPORT   Patient Name:   Robert [name] Date of Exam: 05/31/2020 Medical Rec #:  [id number]      Height:       72.0 in Accession #:    [id number]     Weight:       223.7 lb Date of Birth:  07/13/1951      BSA:          2.234 m Patient Age:    18 years       BP:           99/65 mmHg Patient Gender: M              HR:           94 bpm. Exam Location:  Inpatient Procedure: Limited Color Doppler and Color Doppler Indications:    pericardial effusion 423.9  History:        Patient has prior history of Echocardiogram examinations, most                 recent 05/26/2020. COPD and chronic kidney disease; Risk                 Factors:Dyslipidemia and Diabetes.  Sonographer:    Johny Chess Referring Phys: [id number] [place]  1. Left ventricular ejection fraction, by estimation, is 35 to 40%. The left ventricle has moderately decreased function. The left ventricle demonstrates global hypokinesis.  2. Trivial mitral valve regurgitation.  3. Tricuspid regurgitation signal is  inadequate for assessing PA pressure.  4. The inferior vena cava is dilated in size with <50% respiratory variability, suggesting right atrial pressure of 15 mmHg.  5. There is no evidence of pericardial effusion. FINDINGS  Left Ventricle: Left ventricular ejection fraction, by estimation, is 35 to 40%. The left ventricle has moderately decreased function. The left ventricle demonstrates global hypokinesis. Right Ventricle: Tricuspid regurgitation signal is inadequate for assessing PA pressure. Pericardium: There is no evidence of pericardial effusion. Mitral Valve: Trivial mitral valve regurgitation. Venous: The inferior vena cava is dilated in size with less than 50% respiratory variability, suggesting right atrial pressure of 15 mmHg. IVC IVC diam: 2.60 cm Fransico Him MD Electronically signed by Fransico Him MD Signature Date/Time: 05/31/2020/9:59:44 AM    Final     Cardiac Studies   ECHO:  1. Left ventricular ejection fraction, by estimation, is 35 to 40%. The  left ventricle has moderately decreased function. The left ventricle  demonstrates global hypokinesis.  2. Trivial mitral valve regurgitation.  3. Tricuspid regurgitation signal is inadequate for assessing PA  pressure.  4. The inferior vena cava is dilated in size with <50% respiratory  variability, suggesting right atrial pressure of 15 mmHg.  5. There is no evidence of pericardial effusion.   Patient Profile     69 y.o. 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 He was previously on anticoagulation for PAF but this was discontinued due to f/u heart monitoring showing NSR. He presented to [facility] with worsening SOB, weight gain and edema. Originally seen in ED 05/16/20 and sent home after Lasix but continued to worsen prompting return to ED9/04/2020. On arrival  found to be hypoxic and with  development of coarse atrial fib versus atypical atrial flutter. He was also found to have new LV dysfunction with LVEF 35%, moderate-large pericardial effusion, and AKI on CKD stage III with poor UOP.  Assessment & Plan    Pericardial effusion/tamponade:  Status post pericardiocentesis with 1 liter of fluid removed and drain subsequently removed.  Hemorrhagic effusion.  No accumulation (minimal) on echo.  Continue volume management.    Acute systolic HF:  Net negative 25 liters.  Plan is for him to stop CRRT and see if he begins to make urine and to treat him with IV Lasix.  I will defer to renal the dosing.   Atrial fib:   On amiodarone and beta blocker.  (He has had to have multiple doses of the beta blocker held secondary to hypotension.  Rate control is predominanatly through amiodarone.)    Hold off on oral anticoagulation as this was a frankly bloody effusion.    I am holding off on DOAC and changed to DVT prophylaxis heparin.  If in a month there is no re accumulation of fluid . I would suggest DOAC.    CAD:  Not pursuing invasive evaluation with renal insufficiency.  Consider out patient ischemia work up.     AKI:   Minimal urine output.  Volume managed through CRRT.     For questions or updates, please contact [place] Please consult [email or web address] for contact info under Cardiology/STEMI.   Signed, Minus Breeding, MD  06/02/2020, 9:15 AM

## 2020-06-02 NOTE — Progress Notes (Signed)
Patient ID: Robert [name], male   DOB: 02-Dec-1950, 69 y.o.   MRN: [id number] S: Feels much better.  "my legs are flappy". O:BP (!) 80/51   Pulse (!) 105   Temp (!) 97.5 F (36.4 C) (Oral)   Resp 19   Ht 6' (1.829 m)   Wt 96.9 kg   SpO2 96%   BMI 28.97 kg/m   Intake/Output Summary (Last 24 hours) at 06/02/2020 0824 Last data filed at 06/02/2020 0800 Gross per 24 hour  Intake 1074.91 ml  Output 4197 ml  Net -3122.09 ml   Intake/Output: I/O last 3 completed shifts: In: 1723.7 [P.O.:1520; I.V.:203.7] Out: 6335 [Other:6335]  Intake/Output this shift:  Total I/O In: -  Out: 178 [Other:178] Weight change:  Gen: NAD sitting in chair eating breakfast CVS: tachy at 105 Resp: cta Abd: benign Ext: 1+ BLE edema R>L  Recent Labs  Lab 05/27/20 0548 05/27/20 0548 05/28/20 0030 05/28/20 1623 05/29/20 0343 05/29/20 1541 05/30/20 0416 05/30/20 0416 05/30/20 1605 05/31/20 0401 05/31/20 1528 06/01/20 0322 06/01/20 1600 06/01/20 2256 06/02/20 0338  NA 133*   < > 134*   < > 134*   < > 135   < > 135 135 134* 135 131* 137 134*  K 5.3*   < > 4.0   < > 4.2   < > 4.2   < > 4.8 4.3 4.4 4.5 4.8 4.5 5.3*  CL 97*   < > 95*   < > 99   < > 101  --  102 102 101 101 98  --  103  CO2 26   < > 29   < > 26   < > 25  --  27 25 26 25 25   --  24  GLUCOSE 229*   < > 131*   < > 170*   < > 128*  --  127* 128* 133* 167* 335*  --  185*  BUN 119*   < > 120*   < > 78*   < > 51*  --  38* 29* 24* 19 24*  --  22  CREATININE 2.20*   < > 1.98*   < > 1.31*   < > 1.49*  --  1.32* 1.28* 1.26* 1.32* 1.76*  --  1.70*  ALBUMIN 2.8*   < > 2.9*   < > 2.9*   < > 2.9*  --  2.9* 3.0* 3.0* 3.0* 2.8*  --  3.0*  CALCIUM 8.6*   < > 8.9   < > 8.5*   < > 8.2*  --  8.4* 8.5* 8.5* 8.6* 8.1*  --  8.0*  PHOS  --   --   --    < > 2.9   < > 2.8  --  2.3* 2.4* 2.2* 2.7 2.9  --  2.9  AST 130*  --  92*  --  79*  --  76*  --   --   --   --   --   --   --   --   ALT 226*  --  220*  --  179*  --  179*  --   --   --   --   --   --   --    --    < > = values in this interval not displayed.   Liver Function Tests: Recent Labs  Lab 05/28/20 0030 05/28/20 1623 05/29/20 0343 05/29/20 1541 05/30/20 0416 05/30/20 1605 06/01/20 0322 06/01/20 1600  06/02/20 0338  AST 92*  --  79*  --  76*  --   --   --   --   ALT 220*  --  179*  --  179*  --   --   --   --   ALKPHOS 180*  --  157*  --  172*  --   --   --   --   BILITOT 0.8  --  0.8  --  0.8  --   --   --   --   PROT 6.2*  --  6.1*  --  6.4*  --   --   --   --   ALBUMIN 2.9*   < > 2.9*   < > 2.9*   < > 3.0* 2.8* 3.0*   < > = values in this interval not displayed.   No results for input(s): LIPASE, AMYLASE in the last 168 hours. No results for input(s): AMMONIA in the last 168 hours. CBC: Recent Labs  Lab 05/29/20 0343 05/29/20 0343 05/30/20 0416 05/30/20 0416 05/31/20 0401 05/31/20 0401 06/01/20 0322 06/01/20 2256 06/02/20 0338  WBC 8.5   < > 11.3*   < > 11.7*  --  11.6*  --  17.3*  HGB 13.3   < > 13.5   < > 13.8   < > 13.2 15.3 13.1  HCT 41.4   < > 42.3   < > 43.2   < > 41.0 45.0 41.6  MCV 93.9  --  94.4  --  94.7  --  95.6  --  96.3  PLT 177   < > 141*   < > 125*  --  106*  --  75*   < > = values in this interval not displayed.   Cardiac Enzymes: No results for input(s): CKTOTAL, CKMB, CKMBINDEX, TROPONINI in the last 168 hours. CBG: Recent Labs  Lab 05/31/20 2149 06/01/20 0627 06/01/20 1139 06/01/20 1552 06/01/20 2211  GLUCAP 147* 160* 190* 340* 157*    Iron Studies: No results for input(s): IRON, TIBC, TRANSFERRIN, FERRITIN in the last 72 hours. Studies/Results: ECHOCARDIOGRAM LIMITED  Result Date: 05/31/2020    ECHOCARDIOGRAM LIMITED REPORT   Patient Name:   CHIN WACHTER Date of Exam: 05/31/2020 Medical Rec #:  [id number]      Height:       72.0 in Accession #:    [id number]     Weight:       223.7 lb Date of Birth:  [birth year]      BSA:          2.234 m Patient Age:    81 years       BP:           99/65 mmHg Patient Gender: M              HR:            94 bpm. Exam Location:  Inpatient Procedure: Limited Color Doppler and Color Doppler Indications:    pericardial effusion 423.9  History:        Patient has prior history of Echocardiogram examinations, most                 recent 05/26/2020. COPD and chronic kidney disease; Risk                 Factors:Dyslipidemia and Diabetes.  Sonographer:    Johny Chess Referring Phys: [id number] [place]  1. Left  ventricular ejection fraction, by estimation, is 35 to 40%. The left ventricle has moderately decreased function. The left ventricle demonstrates global hypokinesis.  2. Trivial mitral valve regurgitation.  3. Tricuspid regurgitation signal is inadequate for assessing PA pressure.  4. The inferior vena cava is dilated in size with <50% respiratory variability, suggesting right atrial pressure of 15 mmHg.  5. There is no evidence of pericardial effusion. FINDINGS  Left Ventricle: Left ventricular ejection fraction, by estimation, is 35 to 40%. The left ventricle has moderately decreased function. The left ventricle demonstrates global hypokinesis. Right Ventricle: Tricuspid regurgitation signal is inadequate for assessing PA pressure. Pericardium: There is no evidence of pericardial effusion. Mitral Valve: Trivial mitral valve regurgitation. Venous: The inferior vena cava is dilated in size with less than 50% respiratory variability, suggesting right atrial pressure of 15 mmHg. IVC IVC diam: 2.60 cm Fransico Him MD Electronically signed by Fransico Him MD Signature Date/Time: 05/31/2020/9:59:44 AM    Final    . amiodarone  200 mg Oral BID  . B-complex with vitamin C  1 tablet Oral Daily  . Chlorhexidine Gluconate Cloth  6 each Topical Daily  . dextromethorphan-guaiFENesin  1 tablet Oral BID  . feeding supplement (ENSURE ENLIVE)  237 mL Oral BID BM  . fluticasone  1 spray Each Nare Daily  . folic acid  1 mg Oral Daily  . gabapentin  300 mg Oral TID  . heparin  5,000 Units  Subcutaneous Q8H  . influenza vaccine adjuvanted  0.5 mL Intramuscular Tomorrow-1000  . insulin aspart  0-20 Units Subcutaneous TID WC  . insulin aspart  6 Units Subcutaneous TID WC  . insulin glargine  10 Units Subcutaneous Daily  . ipratropium-albuterol  3 mL Nebulization BID  . melatonin  3 mg Oral QHS  . metoprolol tartrate  25 mg Oral BID  . mometasone-formoterol  2 puff Inhalation BID  . primidone  100 mg Oral QHS  . sodium chloride flush  10-40 mL Intracatheter Q12H  . sodium chloride flush  3 mL Intravenous Q12H  . sodium chloride flush  3 mL Intravenous Q12H  . tamsulosin  0.4 mg Oral Daily    BMET    Component Value Date/Time   NA 134 (L) 06/02/2020 0338   NA 135 10/17/2019 1628   K 5.3 (H) 06/02/2020 0338   CL 103 06/02/2020 0338   CO2 24 06/02/2020 0338   GLUCOSE 185 (H) 06/02/2020 0338   BUN 22 06/02/2020 0338   BUN 34 (H) 10/17/2019 1628   CREATININE 1.70 (H) 06/02/2020 0338   CALCIUM 8.0 (L) 06/02/2020 0338   GFRNONAA 41 (L) 06/02/2020 0338   GFRAA 47 (L) 06/02/2020 0338   CBC    Component Value Date/Time   WBC 17.3 (H) 06/02/2020 0338   RBC 4.32 06/02/2020 0338   HGB 13.1 06/02/2020 0338   HGB 16.3 09/25/2017 1135   HCT 41.6 06/02/2020 0338   HCT 47.1 09/25/2017 1135   PLT 75 (L) 06/02/2020 0338   PLT 159 09/25/2017 1135   MCV 96.3 06/02/2020 0338   MCV 91 09/25/2017 1135   MCH 30.3 06/02/2020 0338   MCHC 31.5 06/02/2020 0338   RDW 14.8 06/02/2020 0338   RDW 14.5 09/25/2017 1135   LYMPHSABS 0.6 (L) 05/20/2020 0629   MONOABS 1.1 (H) 05/20/2020 0629   EOSABS 0.0 05/20/2020 0629   BASOSABS 0.0 05/20/2020 0629    Assessment/Plan:  1. AKI/CKD stage 3b- in setting of acute decompensated CHF with pericardial effusion. Sub-optimal response  to IV diuretics and started on CRRT 05/28/20 for better UF. No significant UOP since starting CRRT. Had been producing over 2.5 liters with IV diuretics.  1. Significant improvement of volume with CRRT 2. Will  stop CRRT today and resume IV lasix at 120 mg bid and follow response. 3. BP is still low and may not be able to tolerate IHD 4. Will need to follow UOP and Scr 2. Urinary retention- cont with flomax. 3. Hyperkalemia - will recheck later today 4. Acute on chronic systolic CHF- cardiology following. Holding off on IV contrast due to AKI/CKD 1. Marked improvement with CRRT and is net negative 25 liters since admission 2. Stop CRRT and resume IV lasix and follow UOP and daily weights.  5. Pericardial effusion s/p pericardiocentesis with improvement 6. Acute hypoxic respiratory failure due to CHF. Improved with volume removal 7. Hyponatremia- due to CHF, improving with CRRT 8. COPD 9. DM type 2 per primary 10. A fib- currently rate controlled and on anticoagulation. Per cardiology.  Donetta Potts, MD [employer] [phone]

## 2020-06-02 NOTE — Progress Notes (Addendum)
Inpatient Diabetes Program Recommendations  AACE/ADA: New Consensus Statement on Inpatient Glycemic Control (2015)  Target Ranges:  Prepandial:   less than 140 mg/dL      Peak postprandial:   less than 180 mg/dL (1-2 hours)      Critically ill patients:  140 - 180 mg/dL   Lab Results  Component Value Date   GLUCAP 172 (H) 06/02/2020   HGBA1C 7.3 (H) 05/20/2020    Review of Glycemic Control Results for Robert [name], Robert [name] (MRN [id number]) as of 06/02/2020 11:17  Ref. Range 06/01/2020 06:27 06/01/2020 11:39 06/01/2020 15:52 06/01/2020 22:11 06/02/2020 06:45  Glucose-Capillary Latest Ref Range: 70 - 99 mg/dL 160 (H) 190 (H) 340 (H) 157 (H) 172 (H)   Diabetes history: DM 2 Outpatient Diabetes medications:  Precose 100 mg tid with meals, Amaryl 2 mg with breakfast, Toujeo 25 units daily Current orders for Inpatient glycemic control:  Novolog resistant tid with meals Novolog 6 units tid with meals Lantus 10 units daily  Inpatient Diabetes Program Recommendations:   Patient received 23 units of Novolog correction on 06/01/20.   Please consider increasing Lantus to 20 units daily.   Thanks  Adah Perl, RN, BC-ADM Inpatient Diabetes Coordinator Pager [phone] (8a-5p)

## 2020-06-03 LAB — CBC
HCT: 36.7 % — ABNORMAL LOW (ref 39.0–52.0)
Hemoglobin: 11.5 g/dL — ABNORMAL LOW (ref 13.0–17.0)
MCH: 30.7 pg (ref 26.0–34.0)
MCHC: 31.3 g/dL (ref 30.0–36.0)
MCV: 98.1 fL (ref 80.0–100.0)
Platelets: 56 10*3/uL — ABNORMAL LOW (ref 150–400)
RBC: 3.74 MIL/uL — ABNORMAL LOW (ref 4.22–5.81)
RDW: 14.8 % (ref 11.5–15.5)
WBC: 11.4 10*3/uL — ABNORMAL HIGH (ref 4.0–10.5)
nRBC: 0 % (ref 0.0–0.2)

## 2020-06-03 LAB — GLUCOSE, CAPILLARY
Glucose-Capillary: 173 mg/dL — ABNORMAL HIGH (ref 70–99)
Glucose-Capillary: 336 mg/dL — ABNORMAL HIGH (ref 70–99)
Glucose-Capillary: 299 mg/dL — ABNORMAL HIGH (ref 70–99)
Glucose-Capillary: 395 mg/dL — ABNORMAL HIGH (ref 70–99)
Glucose-Capillary: 352 mg/dL — ABNORMAL HIGH (ref 70–99)

## 2020-06-03 LAB — RENAL FUNCTION PANEL
Albumin: 2.6 g/dL — ABNORMAL LOW (ref 3.5–5.0)
Anion gap: 8 (ref 5–15)
BUN: 41 mg/dL — ABNORMAL HIGH (ref 8–23)
CO2: 23 mmol/L (ref 22–32)
Calcium: 8.8 mg/dL — ABNORMAL LOW (ref 8.9–10.3)
Chloride: 100 mmol/L (ref 98–111)
Creatinine, Ser: 2.46 mg/dL — ABNORMAL HIGH (ref 0.61–1.24)
GFR calc Af Amer: 30 mL/min — ABNORMAL LOW (ref >60)
GFR calc non Af Amer: 26 mL/min — ABNORMAL LOW (ref >60)
Glucose, Bld: 173 mg/dL — ABNORMAL HIGH (ref 70–99)
Phosphorus: 4.9 mg/dL — ABNORMAL HIGH (ref 2.5–4.6)
Potassium: 5.3 mmol/L — ABNORMAL HIGH (ref 3.5–5.1)
Sodium: 131 mmol/L — ABNORMAL LOW (ref 135–145)

## 2020-06-03 LAB — DIC (DISSEMINATED INTRAVASCULAR COAGULATION)PANEL
D-Dimer, Quant: 5.16 ug/mL-FEU — ABNORMAL HIGH (ref 0.00–0.50)
Fibrinogen: 478 mg/dL — ABNORMAL HIGH (ref 210–475)
INR: 1.3 — ABNORMAL HIGH (ref 0.8–1.2)
Platelets: 55 10*3/uL — ABNORMAL LOW (ref 150–400)
Prothrombin Time: 15.3 seconds — ABNORMAL HIGH (ref 11.4–15.2)
Smear Review: NONE SEEN
aPTT: 35 seconds (ref 24–36)

## 2020-06-03 LAB — COOXEMETRY PANEL
Carboxyhemoglobin: 1.5 % (ref 0.5–1.5)
Methemoglobin: 1.3 % (ref 0.0–1.5)
O2 Saturation: 69.1 %
Total hemoglobin: 11.3 g/dL — ABNORMAL LOW (ref 12.0–16.0)

## 2020-06-03 LAB — MAGNESIUM: Magnesium: 2.4 mg/dL (ref 1.7–2.4)

## 2020-06-03 LAB — APTT: aPTT: 36 seconds (ref 24–36)

## 2020-06-03 MED ORDER — FUROSEMIDE 10 MG/ML IJ SOLN
120.0000 mg | Freq: Four times a day (QID) | INTRAVENOUS | Status: DC
  Administered 2020-06-03 – 2020-06-04 (×3): 120 mg via INTRAVENOUS
  Filled 2020-06-03 (×6): qty 12
  Filled 2020-06-03: qty 10
  Filled 2020-06-03 (×6): qty 12

## 2020-06-03 MED ORDER — MIDODRINE HCL 5 MG PO TABS
10.0000 mg | ORAL_TABLET | Freq: Three times a day (TID) | ORAL | Status: DC
  Administered 2020-06-03 – 2020-06-09 (×19): 10 mg via ORAL
  Filled 2020-06-03 (×19): qty 2

## 2020-06-03 MED ORDER — INSULIN DETEMIR 100 UNIT/ML ~~LOC~~ SOLN
15.0000 [IU] | Freq: Two times a day (BID) | SUBCUTANEOUS | Status: DC
  Administered 2020-06-03 – 2020-06-04 (×3): 15 [IU] via SUBCUTANEOUS
  Filled 2020-06-03 (×5): qty 0.15

## 2020-06-03 MED ORDER — SODIUM ZIRCONIUM CYCLOSILICATE 10 G PO PACK
10.0000 g | PACK | Freq: Two times a day (BID) | ORAL | Status: AC
  Administered 2020-06-03 – 2020-06-04 (×3): 10 g via ORAL
  Filled 2020-06-03 (×4): qty 1

## 2020-06-03 NOTE — Plan of Care (Signed)
  Problem: Education: Goal: Knowledge of General Education information will improve Description: Including pain rating scale, medication(s)/side effects and non-pharmacologic comfort measures Outcome: Progressing   Problem: Health Behavior/Discharge Planning: Goal: Ability to manage health-related needs will improve Outcome: Progressing   Problem: Clinical Measurements: Goal: Ability to maintain clinical measurements within normal limits will improve Outcome: Progressing Goal: Will remain free from infection Outcome: Progressing Goal: Cardiovascular complication will be avoided Outcome: Progressing   Problem: Nutrition: Goal: Adequate nutrition will be maintained Outcome: Progressing   

## 2020-06-03 NOTE — Progress Notes (Signed)
Progress Note  Patient Name: Robert [name] Date of Encounter: 06/03/2020  Primary Cardiologist:   Sanda Klein, MD   Subjective   No pain.  Breathing OK.  Ambulated.  Minimal urine output.    Inpatient Medications    Scheduled Meds: . amiodarone  200 mg Oral BID  . B-complex with vitamin C  1 tablet Oral Daily  . Chlorhexidine Gluconate Cloth  6 each Topical Daily  . dextromethorphan-guaiFENesin  1 tablet Oral BID  . feeding supplement (ENSURE ENLIVE)  237 mL Oral BID BM  . fluticasone  1 spray Each Nare Daily  . folic acid  1 mg Oral Daily  . gabapentin  300 mg Oral TID  . heparin  5,000 Units Subcutaneous Q8H  . influenza vaccine adjuvanted  0.5 mL Intramuscular Tomorrow-1000  . insulin aspart  0-20 Units Subcutaneous TID WC  . insulin aspart  6 Units Subcutaneous TID WC  . insulin glargine  20 Units Subcutaneous QHS  . ipratropium-albuterol  3 mL Nebulization BID  . melatonin  3 mg Oral QHS  . metoprolol tartrate  25 mg Oral BID  . mometasone-formoterol  2 puff Inhalation BID  . primidone  100 mg Oral QHS  . sodium chloride flush  10-40 mL Intracatheter Q12H  . sodium chloride flush  3 mL Intravenous Q12H  . sodium chloride flush  3 mL Intravenous Q12H  . tamsulosin  0.4 mg Oral Daily   Continuous Infusions: .  prismasol BGK 4/2.5 500 mL/hr at 06/02/20 0344  .  prismasol BGK 4/2.5 300 mL/hr at 06/02/20 0100  . sodium chloride    . sodium chloride    . furosemide    . heparin 10,000 units/ 20 mL infusion syringe 1,550 Units/hr (06/02/20 0929)  . prismasol BGK 4/2.5 1,800 mL/hr at 06/02/20 0956   PRN Meds: sodium chloride, sodium chloride, acetaminophen, albuterol, heparin, heparin, loperamide, Muscle Rub, ondansetron (ZOFRAN) IV, oxyCODONE, sodium chloride flush, sodium chloride flush, sodium chloride flush   Vital Signs    Vitals:   06/03/20 0100 06/03/20 0200 06/03/20 0300 06/03/20 0600  BP: (!) 80/51 (!) 77/59    Pulse: 97 95    Resp: (!) 24 20     Temp:   (!) 97.5 F (36.4 C)   TempSrc:   Oral   SpO2: 94% 99%    Weight:    99 kg  Height:        Intake/Output Summary (Last 24 hours) at 06/03/2020 0746 Last data filed at 06/02/2020 2200 Gross per 24 hour  Intake 1260 ml  Output 987 ml  Net 273 ml   Filed Weights   05/31/20 0500 06/02/20 0600 06/03/20 0600  Weight: 101.5 kg 96.9 kg 99 kg    Telemetry    Atrial fib with controlled rate.  - Personally Reviewed  ECG    NA - Personally Reviewed  Physical Exam   GEN: No  acute distress.   Neck: No  JVD Cardiac:  Irregular RR,  murmurs, rubs, or gallops.  Respiratory: Clear   to auscultation bilaterally. GI: Soft, nontender, non-distended, normal bowel sounds  MS:  Mild leg edema; No deformity. Neuro:   Nonfocal  Psych: Oriented and appropriate    Labs    Chemistry Recent Labs  Lab 05/28/20 0030 05/28/20 1623 05/29/20 0343 05/29/20 1541 05/30/20 0416 05/30/20 1605 06/02/20 0338 06/02/20 1600 06/03/20 0508  NA 134*   < > 134*   < > 135   < > 134* 131* 131*  K 4.0   < > 4.2   < > 4.2   < > 5.3* 5.5* 5.3*  CL 95*   < > 99   < > 101   < > 103 100 100  CO2 29   < > 26   < > 25   < > 24 25 23   GLUCOSE 131*   < > 170*   < > 128*   < > 185* 267* 173*  BUN 120*   < > 78*   < > 51*   < > 22 28* 41*  CREATININE 1.98*   < > 1.31*   < > 1.49*   < > 1.70* 2.08* 2.46*  CALCIUM 8.9   < > 8.5*   < > 8.2*   < > 8.0* 8.4* 8.8*  PROT 6.2*  --  6.1*  --  6.4*  --   --   --   --   ALBUMIN 2.9*   < > 2.9*   < > 2.9*   < > 3.0* 2.9* 2.6*  AST 92*  --  79*  --  76*  --   --   --   --   ALT 220*  --  179*  --  179*  --   --   --   --   ALKPHOS 180*  --  157*  --  172*  --   --   --   --   BILITOT 0.8  --  0.8  --  0.8  --   --   --   --   GFRNONAA 34*   < > 56*   < > 48*   < > 41* 32* 26*  GFRAA 39*   < > >60   < > 55*   < > 47* 37* 30*  ANIONGAP 10   < > 9   < > 9   < > 7 6 8    < > = values in this interval not displayed.     Hematology Recent Labs  Lab 06/01/20 0322  06/01/20 0322 06/01/20 2256 06/02/20 0338 06/03/20 0508  WBC 11.6*  --   --  17.3* 11.4*  RBC 4.29  --   --  4.32 3.74*  HGB 13.2   < > 15.3 13.1 11.5*  HCT 41.0   < > 45.0 41.6 36.7*  MCV 95.6  --   --  96.3 98.1  MCH 30.8  --   --  30.3 30.7  MCHC 32.2  --   --  31.5 31.3  RDW 14.6  --   --  14.8 14.8  PLT 106*  --   --  75* 56*   < > = values in this interval not displayed.    Cardiac EnzymesNo results for input(s): TROPONINI in the last 168 hours. No results for input(s): TROPIPOC in the last 168 hours.   BNPNo results for input(s): BNP, PROBNP in the last 168 hours.   DDimer No results for input(s): DDIMER in the last 168 hours.   Radiology    No results found.  Cardiac Studies   ECHO:  1. Left ventricular ejection fraction, by estimation, is 35 to 40%. The  left ventricle has moderately decreased function. The left ventricle  demonstrates global hypokinesis.  2. Trivial mitral valve regurgitation.  3. Tricuspid regurgitation signal is inadequate for assessing PA  pressure.  4. The inferior vena cava is dilated in size with <50% respiratory  variability, suggesting right  atrial pressure of 15 mmHg.  5. There is no evidence of pericardial effusion.   Patient Profile     69 y.o. 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 He was previously on anticoagulation for PAF but this was discontinued due to f/u heart monitoring showing NSR. He presented to [facility] with worsening SOB, weight gain and edema. Originally seen in ED 05/16/20 and sent home after Lasix but continued to worsen prompting return to ED9/04/2020. On arrival found to be hypoxic and with development of coarse atrial fib versus atypical atrial flutter. He was also found to have new LV dysfunction with LVEF 35%, moderate-large pericardial effusion, and AKI on CKD stage  III with poor UOP.  Assessment & Plan    Pericardial effusion/tamponade:  Status post pericardiocentesis with 1 liter of fluid removed.  Hemorrhagic effusion.  No accumulation (minimal) on echo.  Continue volume management.   I will plan a repeat echo in about one month.   Acute systolic HF:  Net negative 24 liters.  CRRT stopped yesterday with net positive intake and minimal urine output via cath.    Nephrology increased Lasix.  Unable to titrate meds with hypotension.   He has been progressively more hypotensive.  I will check a CoOx and CVP.    Atrial fib:   On amiodarone.  Beta blocker dose has been typically held secondary to low BP.   Hold off on oral anticoagulation as this was a frankly bloody effusion.    I am holding off on DOAC and changed to DVT prophylaxis heparin.  If in a month there is no re accumulation of fluid . I would suggest DOAC.     CAD:  Not pursuing invasive evaluation with renal insufficiency.  Consider out patient ischemia work up.   EF is reduced with regional wall motion abnormality.  He has no active ischemic symptoms.    AKI:   Minimal urine output.  See above. Creat rising.  Potassium is up.  He might need HD but BP will make this difficult.     For questions or updates, please contact [facility] Please consult [email or web address] for contact info under Cardiology/STEMI.   Signed, Minus Breeding, MD  06/03/2020, 7:46 AM

## 2020-06-03 NOTE — Progress Notes (Signed)
Patient ID: Robert [name], male   DOB: 10/14/1950, 69 y.o.   MRN: [id number] S: Feels well and was able to walk around the unit but he did not receive any IV lasix due to hypotension yesterday.  Nor did he receive any today. O:BP (!) 95/58   Pulse (!) 101   Temp 98.3 F (36.8 C) (Axillary)   Resp (!) 21   Ht 6' (1.829 m)   Wt 99 kg   SpO2 92%   BMI 29.60 kg/m   Intake/Output Summary (Last 24 hours) at 06/03/2020 0929 Last data filed at 06/03/2020 0800 Gross per 24 hour  Intake 1410 ml  Output 636 ml  Net 774 ml   Intake/Output: I/O last 3 completed shifts: In: 13 [P.O.:1610; I.V.:20] Out: 3192 [Urine:350; Other:2842]  Intake/Output this shift:  Total I/O In: 370 [P.O.:370] Out: -  Weight change: 2.1 kg Gen: NAD sitting up in chair VFI:EPPIR Resp: CTA Abd: +BS, soft, NT/ND Ext:1+ BLE edema  Recent Labs  Lab 05/28/20 0030 05/28/20 1623 05/29/20 0343 05/29/20 1541 05/30/20 0416 05/30/20 1605 05/31/20 0401 05/31/20 0401 05/31/20 1528 06/01/20 0322 06/01/20 1600 06/01/20 2256 06/02/20 0338 06/02/20 1600 06/03/20 0508  NA 134*   < > 134*   < > 135   < > 135   < > 134* 135 131* 137 134* 131* 131*  K 4.0   < > 4.2   < > 4.2   < > 4.3   < > 4.4 4.5 4.8 4.5 5.3* 5.5* 5.3*  CL 95*   < > 99   < > 101   < > 102  --  101 101 98  --  103 100 100  CO2 29   < > 26   < > 25   < > 25  --  26 25 25   --  24 25 23   GLUCOSE 131*   < > 170*   < > 128*   < > 128*  --  133* 167* 335*  --  185* 267* 173*  BUN 120*   < > 78*   < > 51*   < > 29*  --  24* 19 24*  --  22 28* 41*  CREATININE 1.98*   < > 1.31*   < > 1.49*   < > 1.28*  --  1.26* 1.32* 1.76*  --  1.70* 2.08* 2.46*  ALBUMIN 2.9*   < > 2.9*   < > 2.9*   < > 3.0*  --  3.0* 3.0* 2.8*  --  3.0* 2.9* 2.6*  CALCIUM 8.9   < > 8.5*   < > 8.2*   < > 8.5*  --  8.5* 8.6* 8.1*  --  8.0* 8.4* 8.8*  PHOS  --    < > 2.9   < > 2.8   < > 2.4*  --  2.2* 2.7 2.9  --  2.9 3.4 4.9*  AST 92*  --  79*  --  76*  --   --   --   --   --   --   --    --   --   --   ALT 220*  --  179*  --  179*  --   --   --   --   --   --   --   --   --   --    < > = values in this interval not displayed.   Liver  Function Tests: Recent Labs  Lab 05/28/20 0030 05/28/20 1623 05/29/20 0343 05/29/20 1541 05/30/20 0416 05/30/20 1605 06/02/20 0338 06/02/20 1600 06/03/20 0508  AST 92*  --  79*  --  76*  --   --   --   --   ALT 220*  --  179*  --  179*  --   --   --   --   ALKPHOS 180*  --  157*  --  172*  --   --   --   --   BILITOT 0.8  --  0.8  --  0.8  --   --   --   --   PROT 6.2*  --  6.1*  --  6.4*  --   --   --   --   ALBUMIN 2.9*   < > 2.9*   < > 2.9*   < > 3.0* 2.9* 2.6*   < > = values in this interval not displayed.   No results for input(s): LIPASE, AMYLASE in the last 168 hours. No results for input(s): AMMONIA in the last 168 hours. CBC: Recent Labs  Lab 05/30/20 0416 05/30/20 0416 05/31/20 0401 05/31/20 0401 06/01/20 0322 06/01/20 0322 06/01/20 2256 06/02/20 0338 06/03/20 0508  WBC 11.3*   < > 11.7*   < > 11.6*  --   --  17.3* 11.4*  HGB 13.5   < > 13.8   < > 13.2   < > 15.3 13.1 11.5*  HCT 42.3   < > 43.2   < > 41.0   < > 45.0 41.6 36.7*  MCV 94.4  --  94.7  --  95.6  --   --  96.3 98.1  PLT 141*   < > 125*   < > 106*  --   --  75* 56*   < > = values in this interval not displayed.   Cardiac Enzymes: No results for input(s): CKTOTAL, CKMB, CKMBINDEX, TROPONINI in the last 168 hours. CBG: Recent Labs  Lab 06/02/20 1117 06/02/20 1559 06/02/20 2155 06/03/20 0657 06/03/20 0813  GLUCAP 222* 254* 166* 173* 336*    Iron Studies: No results for input(s): IRON, TIBC, TRANSFERRIN, FERRITIN in the last 72 hours. Studies/Results: No results found. [name] amiodarone  200 mg Oral BID  . B-complex with vitamin C  1 tablet Oral Daily  . Chlorhexidine Gluconate Cloth  6 each Topical Daily  . dextromethorphan-guaiFENesin  1 tablet Oral BID  . feeding supplement (ENSURE ENLIVE)  237 mL Oral BID BM  . fluticasone  1 spray Each  Nare Daily  . folic acid  1 mg Oral Daily  . gabapentin  300 mg Oral TID  . influenza vaccine adjuvanted  0.5 mL Intramuscular Tomorrow-1000  . insulin aspart  0-20 Units Subcutaneous TID WC  . insulin aspart  6 Units Subcutaneous TID WC  . insulin detemir  15 Units Subcutaneous BID  . ipratropium-albuterol  3 mL Nebulization BID  . melatonin  3 mg Oral QHS  . metoprolol tartrate  25 mg Oral BID  . midodrine  10 mg Oral TID WC  . mometasone-formoterol  2 puff Inhalation BID  . primidone  100 mg Oral QHS  . sodium chloride flush  10-40 mL Intracatheter Q12H  . sodium chloride flush  3 mL Intravenous Q12H  . sodium chloride flush  3 mL Intravenous Q12H  . sodium zirconium cyclosilicate  10 g Oral BID  . tamsulosin  0.4 mg Oral  Daily    BMET    Component Value Date/Time   NA 131 (L) 06/03/2020 0508   NA 135 10/17/2019 1628   K 5.3 (H) 06/03/2020 0508   CL 100 06/03/2020 0508   CO2 23 06/03/2020 0508   GLUCOSE 173 (H) 06/03/2020 0508   BUN 41 (H) 06/03/2020 0508   BUN 34 (H) 10/17/2019 1628   CREATININE 2.46 (H) 06/03/2020 0508   CALCIUM 8.8 (L) 06/03/2020 0508   GFRNONAA 26 (L) 06/03/2020 0508   GFRAA 30 (L) 06/03/2020 0508   CBC    Component Value Date/Time   WBC 11.4 (H) 06/03/2020 0508   RBC 3.74 (L) 06/03/2020 0508   HGB 11.5 (L) 06/03/2020 0508   HGB 16.3 09/25/2017 1135   HCT 36.7 (L) 06/03/2020 0508   HCT 47.1 09/25/2017 1135   PLT 56 (L) 06/03/2020 0508   PLT 159 09/25/2017 1135   MCV 98.1 06/03/2020 0508   MCV 91 09/25/2017 1135   MCH 30.7 06/03/2020 0508   MCHC 31.3 06/03/2020 0508   RDW 14.8 06/03/2020 0508   RDW 14.5 09/25/2017 1135   LYMPHSABS 0.6 (L) 05/20/2020 0629   MONOABS 1.1 (H) 05/20/2020 0629   EOSABS 0.0 05/20/2020 0629   BASOSABS 0.0 05/20/2020 0629    Assessment/Plan:  1. AKI/CKD stage 3b- in setting of acute decompensated CHF with pericardial effusion. Sub-optimal response to IV diuretics and started on CRRT 05/28/20 for better UF.  No significant UOP since starting CRRT. Had been producing over 2.5 liters with IV diuretics.  1. Significant improvement of volume with CRRT which was stopped 06/02/20 2. Will resume IV lasix at 120 mg bid and follow response. 3. BP is still low and may not be able to tolerate IHD.  Discussed the possible need to resume CRRT. 4. Recommend Heart failure team involvement to assist improving cardiac funtion. 5. Will need to follow UOP and Scr 2. Urinary retention- cont with flomax. 3. Hyperkalemia - will recheck later today 4. Acute on chronic systolic CHF- cardiology following. Holding off on IV contrast due to AKI/CKD 1. Marked improvement with CRRT and is net negative 25 liters since admission 2. Stopped CRRT on 06/02/20 but IV lasix not given due to hypotension. 3. Cardiology following and await input.  5. Pericardial effusion s/p pericardiocentesis with improvement 6. Acute hypoxic respiratory failure due to CHF. Improved with volume removal 7. Hyponatremia- due to CHF, improving with CRRT 8. COPD 9. DM type 2 per primary 10. A fib- currently rate controlled and on anticoagulation. Per cardiology.  Donetta Potts, MD [employer] [phone]

## 2020-06-03 NOTE — Progress Notes (Signed)
NAME:  Robert [name], MRN:  [id number], DOB:  1951-05-17, LOS: 28 ADMISSION DATE:  06/07/2020, CONSULTATION DATE:  05/28/2020 REFERRING MD:  Dr. Teryl Lucy, CHIEF COMPLAINT:  Hypoxic    Brief History   69 year old gentleman past medical history of hypertension, hyperlipidemia, COPD, congestive heart failure, CKD stage IIIb.  Patient also has new systolic and diastolic cardiomyopathy, acute on chronic with ejection fraction 35 to 40%.  Patient with increasing oxygen requirements.  New enlarging pleural effusion.  Decision made for need of CVVHD after discussion with nephrology.  Pulmonary critical care consulted.  History of present illness   This is a 68 year old gentleman past medical history of hypertension hyperlipidemia, COPD, congestive heart failure, CKD 3B.  New systolic diastolic cardiomyopathy with an EF of 35 to 40%.  Patient has been on maximal diuretics with tapering urine output.  He had declined HD over the past couple of days hoping that the diuretics would continue to work.  Unfortunately had increasing oxygen requirements.  Chest x-ray completed today with a large right-sided pleural effusion as well as fluid retention in his bilateral lower extremities and pulmonary vascular congestion.  Decision was made for consultation pulmonary critical care due to increasing oxygen requirements and consideration for CVVHD.  I discussed case with nephrology.  They are in agreement to initiate CVVHD at this time.  Past Medical History   Past Medical History:  Diagnosis Date  . Anxiety   . CHF (congestive heart failure) ([place])   . Collagen vascular disease ([place])   . COPD (chronic obstructive pulmonary disease) ([place])   . Coronary artery disease   . COVID-19   . Full dentures   . GERD (gastroesophageal reflux disease)    Rolaids as needed  . High cholesterol   . History of MI (myocardial infarction)   . Hypertension    med. dosage increased 04/2016; has been on BP med. x 10 yrs.  . Incarcerated  umbilical hernia [id number]/9038  . Inguinal hernia 05/2016   bilateral   . Insulin dependent diabetes mellitus   . Ischemic cardiomyopathy    a. 03/2015 EF 40-45% by LV gram.  . Left leg cellulitis 10/08/2016  . PAF (paroxysmal atrial fibrillation) ([facility])    on home amio+metoprolol, plavix  . Rheumatoid arthritis(714.0)      Significant Hospital Events   ICU admission  Consults:  Pulmonary critical care Cardiology Nephrology Hospitalist  Procedures:  05/28/2020: HD catheter  Significant Diagnostic Tests:    Micro Data:    Antimicrobials:  Pericardial fluid: NGTD Blood cx: NGTD MRSA PCR neg COVID neg  Interim history/subjective:  No events, pressures remain borderline.  Denies pain.  Ongoing discussions regarding need for HD.   Objective   Blood pressure (!) 95/58, pulse (!) 101, temperature 98.3 F (36.8 C), temperature source Axillary, resp. rate (!) 21, height 6' (1.829 m), weight 99 kg, SpO2 92 %.        Intake/Output Summary (Last 24 hours) at 06/03/2020 0854 Last data filed at 06/03/2020 0800 Gross per 24 hour  Intake 1410 ml  Output 809 ml  Net 601 ml   Filed Weights   05/31/20 0500 06/02/20 0600 06/03/20 0600  Weight: 101.5 kg 96.9 kg 99 kg    Examination: Constitutional: chronically ill man in no acute distress Eyes: eyes are anicteric, reactive to light Ears, nose, mouth, and throat: mucous membranes moist, trachea midline, malampatti 4 Cardiovascular: heart sounds are irregular, ext are warm to touch. 3+ edema Respiratory: Diminished at bases with crackles  on R Gastrointestinal: abdomen is soft with + BS Skin: No rashes, normal turgor Neurologic: moves all 4 ext to command, weak Psychiatric: RASS 0, good insight   Pressure Injury 07/04/19 Buttocks Right;Upper Stage II -  Partial thickness loss of dermis presenting as a shallow open ulcer with a red, pink wound bed without slough. (Active)  07/04/19 1429  Location: Buttocks  Location Orientation:  Right;Upper  Staging: Stage II -  Partial thickness loss of dermis presenting as a shallow open ulcer with a red, pink wound bed without slough.  Wound Description (Comments):   Present on Admission: Yes     Pressure Injury 05/26/20 Foot Left;Lateral Deep Tissue Pressure Injury - Purple or maroon localized area of discolored intact skin or blood-filled blister due to damage of underlying soft tissue from pressure and/or shear. dark purple circular, 5cm x 5 c (Active)  05/26/20 0820  Location: Foot  Location Orientation: Left;Lateral  Staging: Deep Tissue Pressure Injury - Purple or maroon localized area of discolored intact skin or blood-filled blister due to damage of underlying soft tissue from pressure and/or shear.  Wound Description (Comments): dark purple circular, 5cm x 5 cm  Present on Admission: Yes   WBC up HgB stable Plts down  Resolved Hospital Problem list     Assessment & Plan:   Acute hypoxemic respiratory failure requiring high flow nasal cannula. Improved, to 2L this AM Bilateral pulmonary edema, vascular congestion Bilateral pleural effusions Positive cumulative fluid balance AKI on CKD stage IIIb. Urinary retention Hyperkalemia Hyponatremia - Ongoing discussions on whether further iHD needed, Bps preclude this AM - Will start midodrine to hopefully facilitate either diuretic trial or iHD - Lokelma to temporize K - Continue PTA Flomax, qshift bladder scans  Acute on chronic systolic diastolic heart failure Pericardial effusion w/ tamponade status post pericardiocentesis - Goal-directed heart failure regimen per cardiology services - Continue beta-blocker.  A. 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  Muscular deconditioning- PT/OT consults  Diabetes with hyperglycemia- switch lantus 20 qHS to levemir 15 BID, continue meal time insulin 6 units plus high sliding scale adjust PRN.    Best practice:  Diet: Diabetic Pain/Anxiety/Delirium protocol (if indicated): N/A VAP protocol (if indicated): N/A DVT prophylaxis: SCDs GI prophylaxis: None PPI Glucose control: see above Mobility: Up to chair, mobilize Code Status: Full code Family Communication: updated patient Disposition: should be okay for progressive.   Erskine Emery MD [facility] 06/03/2020 8:54 AM Personal pager: [phone] If unanswered, please page CCM On-call: [phone]

## 2020-06-03 NOTE — Evaluation (Signed)
Physical Therapy Evaluation Patient Details Name: Robert [name] MRN: [id number] DOB: [birth year] Today's Date: 06/03/2020   History of Present Illness  69 yo admitted with dyspnea, AFib with pericardial effusion with tamponade s/p pericardiocentesis 9/14. Course complicated by AKI on CKD with CRRT 9/17-9/22. PMhx: RA, COPD, CHF, CAD, covid, neuropathy, HTN, HLD  Clinical Impression  Pt pleasant sitting in chair reporting having been up for several hours. Pt on 2L at rest with SpO2 93% with desaturation with mobility and maintained 88-96% on 4L. Pt with good ability with transfers with limited activity tolerance and function who will benefit from acute therapy to maximize independence and safety for return home with family.   BP post activity 95/76 (84)    Follow Up Recommendations Home health PT    Equipment Recommendations  None recommended by PT    Recommendations for Other Services       Precautions / Restrictions Precautions Precautions: Fall Precaution Comments: watch sats      Mobility  Bed Mobility Overal bed mobility: Needs Assistance Bed Mobility: Sit to Supine       Sit to supine: Supervision   General bed mobility comments: supervision for lines  Transfers Overall transfer level: Needs assistance   Transfers: Sit to/from Stand Sit to Stand: Supervision         General transfer comment: supervision for lines with good hand placement and controlled descent to surface  Ambulation/Gait Ambulation/Gait assistance: Min guard Gait Distance (Feet): 130 Feet Assistive device: Rolling walker (2 wheeled) Gait Pattern/deviations: Step-through pattern;Decreased stride length;Trunk flexed   Gait velocity interpretation: 1.31 - 2.62 ft/sec, indicative of limited community ambulator General Gait Details: cues for posture, proximity to RW and safety. distance limited by fatigue. SpO2 88-96% on 4L with gait, HR 110  Stairs            Wheelchair Mobility     Modified Rankin (Stroke Patients Only)       Balance Overall balance assessment: Needs assistance   Sitting balance-Leahy Scale: Good       Standing balance-Leahy Scale: Fair                               Pertinent Vitals/Pain Pain Assessment: No/denies pain    Home Living Family/patient expects to be discharged to:: Private residence Living Arrangements: Other relatives Available Help at Discharge: Family Type of Home: House Home Access: Level entry     Home Layout: One level Home Equipment: Shower seat;Cane - single point;Walker - 2 wheels Additional Comments: sister in law will help and he will stay with her.    Prior Function Level of Independence: Independent               Hand Dominance        Extremity/Trunk Assessment   Upper Extremity Assessment Upper Extremity Assessment: Overall WFL for tasks assessed    Lower Extremity Assessment Lower Extremity Assessment: Overall WFL for tasks assessed    Cervical / Trunk Assessment Cervical / Trunk Assessment: Normal  Communication   Communication: No difficulties  Cognition Arousal/Alertness: Awake/alert Behavior During Therapy: WFL for tasks assessed/performed Overall Cognitive Status: Within Functional Limits for tasks assessed                                        General Comments      Exercises  Assessment/Plan    PT Assessment Patient needs continued PT services  PT Problem List Decreased mobility;Decreased activity tolerance;Decreased knowledge of use of DME;Cardiopulmonary status limiting activity       PT Treatment Interventions DME instruction;Therapeutic exercise;Gait training;Functional mobility training;Therapeutic activities;Patient/family education    PT Goals (Current goals can be found in the Care Plan section)  Acute Rehab PT Goals Patient Stated Goal: get to my sister-in-law's house PT Goal Formulation: With patient Time For Goal  Achievement: 06/17/20 Potential to Achieve Goals: Good    Frequency Min 3X/week   Barriers to discharge        Co-evaluation               AM-PAC PT "6 Clicks" Mobility  Outcome Measure Help needed turning from your back to your side while in a flat bed without using bedrails?: None Help needed moving from lying on your back to sitting on the side of a flat bed without using bedrails?: None Help needed moving to and from a bed to a chair (including a wheelchair)?: A Little Help needed standing up from a chair using your arms (e.g., wheelchair or bedside chair)?: A Little Help needed to walk in hospital room?: A Little Help needed climbing 3-5 steps with a railing? : A Little 6 Click Score: 20    End of Session Equipment Utilized During Treatment: Gait belt Activity Tolerance: Patient tolerated treatment well Patient left: in bed;with call bell/phone within reach Nurse Communication: Mobility status PT Visit Diagnosis: Other abnormalities of gait and mobility (R26.89);Difficulty in walking, not elsewhere classified (R26.2)    Time: 1020-1041 PT Time Calculation (min) (ACUTE ONLY): 21 min   Charges:   PT Evaluation $PT Eval Moderate Complexity: 1 Mod          [place], PT Acute Rehabilitation Services Pager: [phone] Office: [phone]   Robert [name] Robert [name] 06/03/2020, 12:55 PM

## 2020-06-03 NOTE — Evaluation (Signed)
Occupational Therapy Evaluation Patient Details Name: Robert [name] MRN: [id number] DOB: 1951/02/23 Today's Date: 06/03/2020    History of Present Illness 69 yo admitted with dyspnea, AFib with pericardial effusion with tamponade s/p pericardiocentesis 9/14. Course complicated by AKI on CKD with CRRT 9/17-9/22. PMhx: RA, COPD, CHF, CAD, covid, neuropathy, HTN, HLD   Clinical Impression   Pt was ambulating with a cane and independent in self care prior to admission. Presents with impaired standing balance and decreased activity tolerance requiring 4L 02 during exertion to maintain Sp02 above 90%. Pt overall requires up to min guard assist for ADL and ambulation with RW. Pt plans to discharge home with his sister in law when medically ready. Will follow acutely.    Follow Up Recommendations  Home health OT    Equipment Recommendations  None recommended by OT    Recommendations for Other Services       Precautions / Restrictions Precautions Precautions: Fall Precaution Comments: watch sats      Mobility Bed Mobility Overal bed mobility: Needs Assistance Bed Mobility: Sit to Supine       Sit to supine: Supervision   General bed mobility comments: supervision for lines  Transfers Overall transfer level: Needs assistance Equipment used: Rolling walker (2 wheeled) Transfers: Sit to/from Stand Sit to Stand: Supervision         General transfer comment: supervision for lines with good hand placement and controlled descent to surface    Balance Overall balance assessment: Needs assistance   Sitting balance-Leahy Scale: Good       Standing balance-Leahy Scale: Fair                             ADL either performed or assessed with clinical judgement   ADL Overall ADL's : Needs assistance/impaired Eating/Feeding: Independent   Grooming: Min guard;Standing   Upper Body Bathing: Set up;Sitting   Lower Body Bathing: Min guard;Sit to/from stand    Upper Body Dressing : Set up;Sitting   Lower Body Dressing: Min guard;Sit to/from stand   Toilet Transfer: Min guard;Ambulation;RW   Toileting- Water quality scientist and Hygiene: Min guard;Sit to/from stand       Functional mobility during ADLs: Min guard;Rolling walker       Vision Baseline Vision/History: Glaucoma;Cataracts;Wears glasses Wears Glasses: Distance only Patient Visual Report: No change from baseline       Perception     Praxis      Pertinent Vitals/Pain Pain Assessment: No/denies pain     Hand Dominance Right   Extremity/Trunk Assessment Upper Extremity Assessment Upper Extremity Assessment: Overall WFL for tasks assessed   Lower Extremity Assessment Lower Extremity Assessment: Defer to PT evaluation   Cervical / Trunk Assessment Cervical / Trunk Assessment: Normal   Communication Communication Communication: No difficulties   Cognition Arousal/Alertness: Awake/alert Behavior During Therapy: WFL for tasks assessed/performed Overall Cognitive Status: Within Functional Limits for tasks assessed                                     General Comments       Exercises     Shoulder Instructions      Home Living Family/patient expects to be discharged to:: Private residence Living Arrangements: Other relatives Available Help at Discharge: Family Type of Home: House Home Access: Level entry     Home Layout: One level  Bathroom Shower/Tub: Teacher, early years/pre: Handicapped height     Home Equipment: Marine scientist - single point;Walker - 2 wheels   Additional Comments: sister in law will help and he will stay with her.      Prior Functioning/Environment Level of Independence: Independent with assistive device(s)        Comments: walked with a cane        OT Problem List: Decreased activity tolerance;Impaired balance (sitting and/or standing);Decreased knowledge of use of DME or  AE;Cardiopulmonary status limiting activity      OT Treatment/Interventions: Self-care/ADL training;DME and/or AE instruction;Patient/family education;Therapeutic activities;Energy conservation    OT Goals(Current goals can be found in the care plan section) Acute Rehab OT Goals Patient Stated Goal: get to my sister-in-law's house OT Goal Formulation: With patient Time For Goal Achievement: 06/17/20 Potential to Achieve Goals: Good ADL Goals Pt Will Perform Grooming: with modified independence;standing (3 activities) Pt Will Perform Lower Body Bathing: with modified independence;sit to/from stand Pt Will Perform Lower Body Dressing: with modified independence;sit to/from stand Pt Will Transfer to Toilet: with modified independence;ambulating Pt Will Perform Toileting - Clothing Manipulation and hygiene: with modified independence;sit to/from stand Additional ADL Goal #1: Pt will generalize energy conservation strategies in ADL and mobility.  OT Frequency: Min 2X/week   Barriers to D/C:            Co-evaluation              AM-PAC OT "6 Clicks" Daily Activity     Outcome Measure Help from another person eating meals?: None Help from another person taking care of personal grooming?: A Little Help from another person toileting, which includes using toliet, bedpan, or urinal?: A Little Help from another person bathing (including washing, rinsing, drying)?: A Little Help from another person to put on and taking off regular upper body clothing?: None Help from another person to put on and taking off regular lower body clothing?: A Little 6 Click Score: 20   End of Session Equipment Utilized During Treatment: Gait belt;Rolling walker  Activity Tolerance: Patient tolerated treatment well Patient left: with call bell/phone within reach;in bed  OT Visit Diagnosis: Unsteadiness on feet (R26.81);Other abnormalities of gait and mobility (R26.89)                Time: 1020-1043 OT  Time Calculation (min): 23 min Charges:  OT General Charges $OT Visit: 1 Visit OT Evaluation $OT Eval Moderate Complexity: 1 Mod  Nestor Lewandowsky, OTR/L Acute Rehabilitation Services Pager: [phone] Office: [phone]  [name] 06/03/2020, 1:19 PM

## 2020-06-04 LAB — RENAL FUNCTION PANEL
Albumin: 2.6 g/dL — ABNORMAL LOW (ref 3.5–5.0)
Anion gap: 10 (ref 5–15)
BUN: 67 mg/dL — ABNORMAL HIGH (ref 8–23)
CO2: 21 mmol/L — ABNORMAL LOW (ref 22–32)
Calcium: 8.5 mg/dL — ABNORMAL LOW (ref 8.9–10.3)
Chloride: 91 mmol/L — ABNORMAL LOW (ref 98–111)
Creatinine, Ser: 2.88 mg/dL — ABNORMAL HIGH (ref 0.61–1.24)
GFR calc Af Amer: 25 mL/min — ABNORMAL LOW (ref >60)
GFR calc non Af Amer: 22 mL/min — ABNORMAL LOW (ref >60)
Glucose, Bld: 362 mg/dL — ABNORMAL HIGH (ref 70–99)
Phosphorus: 5.6 mg/dL — ABNORMAL HIGH (ref 2.5–4.6)
Potassium: 5.4 mmol/L — ABNORMAL HIGH (ref 3.5–5.1)
Sodium: 122 mmol/L — ABNORMAL LOW (ref 135–145)

## 2020-06-04 LAB — HEPATIC FUNCTION PANEL
ALT: 71 U/L — ABNORMAL HIGH (ref 0–44)
AST: 31 U/L (ref 15–41)
Albumin: 2.5 g/dL — ABNORMAL LOW (ref 3.5–5.0)
Alkaline Phosphatase: 135 U/L — ABNORMAL HIGH (ref 38–126)
Bilirubin, Direct: <0.1 mg/dL (ref 0.0–0.2)
Total Bilirubin: 0.5 mg/dL (ref 0.3–1.2)
Total Protein: 5.2 g/dL — ABNORMAL LOW (ref 6.5–8.1)

## 2020-06-04 LAB — GLUCOSE, CAPILLARY
Glucose-Capillary: 308 mg/dL — ABNORMAL HIGH (ref 70–99)
Glucose-Capillary: 437 mg/dL — ABNORMAL HIGH (ref 70–99)
Glucose-Capillary: 464 mg/dL — ABNORMAL HIGH (ref 70–99)
Glucose-Capillary: 189 mg/dL — ABNORMAL HIGH (ref 70–99)
Glucose-Capillary: 298 mg/dL — ABNORMAL HIGH (ref 70–99)

## 2020-06-04 LAB — HEPATITIS PANEL, ACUTE
HCV Ab: NONREACTIVE
Hep A IgM: NONREACTIVE
Hep B C IgM: NONREACTIVE
Hepatitis B Surface Ag: NONREACTIVE

## 2020-06-04 LAB — CBC
HCT: 36.4 % — ABNORMAL LOW (ref 39.0–52.0)
Hemoglobin: 11.7 g/dL — ABNORMAL LOW (ref 13.0–17.0)
MCH: 30.5 pg (ref 26.0–34.0)
MCHC: 32.1 g/dL (ref 30.0–36.0)
MCV: 95 fL (ref 80.0–100.0)
Platelets: 68 10*3/uL — ABNORMAL LOW (ref 150–400)
RBC: 3.83 MIL/uL — ABNORMAL LOW (ref 4.22–5.81)
RDW: 14.6 % (ref 11.5–15.5)
WBC: 14.6 10*3/uL — ABNORMAL HIGH (ref 4.0–10.5)
nRBC: 0 % (ref 0.0–0.2)

## 2020-06-04 LAB — HEPARIN INDUCED PLATELET AB (HIT ANTIBODY): Heparin Induced Plt Ab: 0.153 OD (ref 0.000–0.400)

## 2020-06-04 MED ORDER — CHLORHEXIDINE GLUCONATE CLOTH 2 % EX PADS
6.0000 | MEDICATED_PAD | Freq: Every day | CUTANEOUS | Status: DC
  Administered 2020-06-05 – 2020-06-07 (×3): 6 via TOPICAL

## 2020-06-04 MED ORDER — INSULIN DETEMIR 100 UNIT/ML ~~LOC~~ SOLN
25.0000 [IU] | Freq: Two times a day (BID) | SUBCUTANEOUS | Status: DC
  Administered 2020-06-04 – 2020-06-05 (×2): 25 [IU] via SUBCUTANEOUS
  Filled 2020-06-04 (×3): qty 0.25

## 2020-06-04 MED ORDER — HEPARIN SODIUM (PORCINE) 1000 UNIT/ML IJ SOLN
INTRAMUSCULAR | Status: AC
  Administered 2020-06-04: 2800 [IU]
  Filled 2020-06-04: qty 3

## 2020-06-04 MED ORDER — INSULIN ASPART 100 UNIT/ML ~~LOC~~ SOLN
8.0000 [IU] | Freq: Three times a day (TID) | SUBCUTANEOUS | Status: DC
  Administered 2020-06-04 – 2020-06-06 (×7): 8 [IU] via SUBCUTANEOUS

## 2020-06-04 MED ORDER — ALBUMIN HUMAN 25 % IV SOLN
INTRAVENOUS | Status: AC
  Administered 2020-06-04: 25 g
  Filled 2020-06-04: qty 100

## 2020-06-04 NOTE — Progress Notes (Signed)
Noted consult to check established line. Pt currently in hemodialysis; call made to HD RN to confirm if any current needs. She stated VAST assistance not needed. Consult cleared.

## 2020-06-04 NOTE — Progress Notes (Signed)
Progress Note  Patient Name: Robert [name] Date of Encounter: 06/04/2020  Primary Cardiologist:   Sanda Klein, MD   Subjective   He reports that he feels OK.  Denies pain or SOB.   Inpatient Medications    Scheduled Meds: . amiodarone  200 mg Oral BID  . B-complex with vitamin C  1 tablet Oral Daily  . Chlorhexidine Gluconate Cloth  6 each Topical Daily  . dextromethorphan-guaiFENesin  1 tablet Oral BID  . feeding supplement (ENSURE ENLIVE)  237 mL Oral BID BM  . fluticasone  1 spray Each Nare Daily  . folic acid  1 mg Oral Daily  . gabapentin  300 mg Oral TID  . influenza vaccine adjuvanted  0.5 mL Intramuscular Tomorrow-1000  . insulin aspart  0-20 Units Subcutaneous TID WC  . insulin aspart  6 Units Subcutaneous TID WC  . insulin detemir  15 Units Subcutaneous BID  . ipratropium-albuterol  3 mL Nebulization BID  . melatonin  3 mg Oral QHS  . metoprolol tartrate  25 mg Oral BID  . midodrine  10 mg Oral TID WC  . mometasone-formoterol  2 puff Inhalation BID  . primidone  100 mg Oral QHS  . sodium chloride flush  10-40 mL Intracatheter Q12H  . sodium chloride flush  3 mL Intravenous Q12H  . sodium chloride flush  3 mL Intravenous Q12H  . sodium zirconium cyclosilicate  10 g Oral BID  . tamsulosin  0.4 mg Oral Daily   Continuous Infusions: . sodium chloride    . sodium chloride    . furosemide 120 mg (06/04/20 0522)   PRN Meds: sodium chloride, sodium chloride, acetaminophen, albuterol, heparin, loperamide, Muscle Rub, ondansetron (ZOFRAN) IV, oxyCODONE, sodium chloride flush, sodium chloride flush, sodium chloride flush   Vital Signs    Vitals:   06/03/20 1944 06/03/20 2313 06/04/20 0332 06/04/20 0447  BP:  113/72 100/72   Pulse:  85 82   Resp: (!) 22 18 19    Temp:  97.8 F (36.6 C) 97.8 F (36.6 C)   TempSrc:  Oral Oral   SpO2:  94% 95%   Weight:    103.1 kg  Height:        Intake/Output Summary (Last 24 hours) at 06/04/2020 0753 Last data filed  at 06/04/2020 0457 Gross per 24 hour  Intake 872.32 ml  Output 400 ml  Net 472.32 ml   Filed Weights   06/02/20 0600 06/03/20 0600 06/04/20 0447  Weight: 96.9 kg 99 kg 103.1 kg    Telemetry    Atrial fib with controlled ventricular rate.  - Personally Reviewed  ECG    NA - Personally Reviewed  Physical Exam   GEN: No  acute distress.   Neck: No  JVD Cardiac: Irregular RR, no murmurs, rubs, or gallops.  Respiratory: Clear  to auscultation bilaterally. GI: Soft, nontender, non-distended, normal bowel sounds  MS:  Severe leg edema; No deformity. Neuro:   Nonfocal  Psych: Oriented and appropriate     Labs    Chemistry Recent Labs  Lab 05/29/20 0343 05/29/20 1541 05/30/20 0416 05/30/20 1605 06/02/20 1600 06/03/20 0508 06/04/20 0343  NA 134*   < > 135   < > 131* 131* 122*  K 4.2   < > 4.2   < > 5.5* 5.3* 5.4*  CL 99   < > 101   < > 100 100 91*  CO2 26   < > 25   < > 25  23 21*  GLUCOSE 170*   < > 128*   < > 267* 173* 362*  BUN 78*   < > 51*   < > 28* 41* 67*  CREATININE 1.31*   < > 1.49*   < > 2.08* 2.46* 2.88*  CALCIUM 8.5*   < > 8.2*   < > 8.4* 8.8* 8.5*  PROT 6.1*  --  6.4*  --   --   --   --   ALBUMIN 2.9*   < > 2.9*   < > 2.9* 2.6* 2.6*  AST 79*  --  76*  --   --   --   --   ALT 179*  --  179*  --   --   --   --   ALKPHOS 157*  --  172*  --   --   --   --   BILITOT 0.8  --  0.8  --   --   --   --   GFRNONAA 56*   < > 48*   < > 32* 26* 22*  GFRAA >60   < > 55*   < > 37* 30* 25*  ANIONGAP 9   < > 9   < > 6 8 10    < > = values in this interval not displayed.     Hematology Recent Labs  Lab 06/02/20 0338 06/02/20 0338 06/03/20 0508 06/03/20 1555 06/04/20 0343  WBC 17.3*  --  11.4*  --  14.6*  RBC 4.32  --  3.74*  --  3.83*  HGB 13.1  --  11.5*  --  11.7*  HCT 41.6  --  36.7*  --  36.4*  MCV 96.3  --  98.1  --  95.0  MCH 30.3  --  30.7  --  30.5  MCHC 31.5  --  31.3  --  32.1  RDW 14.8  --  14.8  --  14.6  PLT 75*   < > 56* 55* 68*   < > =  values in this interval not displayed.    Cardiac EnzymesNo results for input(s): TROPONINI in the last 168 hours. No results for input(s): TROPIPOC in the last 168 hours.   BNPNo results for input(s): BNP, PROBNP in the last 168 hours.   DDimer  Recent Labs  Lab 06/03/20 1555  DDIMER 5.16*     Radiology    No results found.  Cardiac Studies   ECHO:  1. Left ventricular ejection fraction, by estimation, is 35 to 40%. The  left ventricle has moderately decreased function. The left ventricle  demonstrates global hypokinesis.  2. Trivial mitral valve regurgitation.  3. Tricuspid regurgitation signal is inadequate for assessing PA  pressure.  4. The inferior vena cava is dilated in size with <50% respiratory  variability, suggesting right atrial pressure of 15 mmHg.  5. There is no evidence of pericardial effusion.   Patient Profile     69 y.o. 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 He was previously on anticoagulation for PAF but this was discontinued due to f/u heart monitoring showing NSR. He presented to [facility] with worsening SOB, weight gain and edema. Originally seen in ED 05/16/20 and sent home after Lasix but continued to worsen prompting return to ED9/04/2020. On arrival found to be hypoxic and with development of coarse atrial  fib versus atypical atrial flutter. He was also found to have new LV dysfunction with LVEF 35%, moderate-large pericardial effusion, and AKI on CKD stage III with poor UOP.  Assessment & Plan    Pericardial effusion/tamponade:    Status post pericardiocentesis with 1 liter of fluid removed.  Hemorrhagic effusion.  No accumulation (minimal) on echo.  Continue volume management.   I will plan a repeat echo in about one month.   Acute systolic HF:  Net negative 21 liters.   Going backward since CRRT  stopped.  CoOx 69.  CVP was 10 yesterday.   He has been on midodrine with his hypotension.   I don't think this represents cardiogenic shock and I am not convinced that inotropic therapy would reverse this.  I discussed this with Dr. Marval Regal.  See below.    Atrial fib:   On amiodarone.  Beta blocker dose has been held secondary to low BP.  Holding off on anticoagulation.  (See previous notes.)  CAD:  Not pursuing invasive evaluation with renal insufficiency.    AKI:   Minimal urine output.  Creat rising.  Na down.  Plan is for attempt at hemodialysis.    For questions or updates, please contact [place] Please consult [email or web address] for contact info under Cardiology/STEMI.   Signed, Minus Breeding, MD  06/04/2020, 7:53 AM

## 2020-06-04 NOTE — TOC Transition Note (Signed)
Transition of Care [facility]) - CM/SW Discharge Note   Patient Details  Name: Robert [name] MRN: [id number] Date of Birth: 29-Sep-1950  Transition of [facility] CM/SW Contact:  Zenon Mayo, RN Phone Number: 06/04/2020, 10:03 AM   Clinical Narrative:    Per previous TOC note , patient would like his [facility] services with Alvis Lemmings, NCM made referral to [facility] with [facility] for [place], [place].  He is able to take referral.  Soc will begin 24 to 48 hrs post dc.    Final next level of care: [place] Barriers to Discharge: Continued Medical Work up   Patient Goals and CMS Choice Patient states their goals for this hospitalization and ongoing recovery are:: Return home with [facility] if recommended CMS Medicare.gov Compare Post Acute Care list provided to:: Patient Choice offered to / list presented to : Patient  Discharge Placement                       Discharge Plan and Services                  DME Agency: NA       HH Arranged: PT, OT HH Agency: [place] Date [facility] Agency Contacted: 06/04/20 Time [place]: 1003 Representative spoke with at [place]: [place] ([place]) Interventions     Readmission Risk Interventions Readmission Risk Prevention Plan 07/07/2019 07/04/2019  Transportation Screening - Complete  PCP or Specialist Appt within 3-5 Days Complete -  HRI or Home Care Consult Complete -  Social Work Consult for [place] Planning/Counseling - Complete  Palliative Care Screening - Not Applicable  Medication Review Press photographer) - Complete  Some recent data might be hidden

## 2020-06-04 NOTE — Plan of Care (Signed)
  Problem: Education: Goal: Knowledge of General Education information will improve Description: Including pain rating scale, medication(s)/side effects and non-pharmacologic comfort measures Outcome: Progressing   Problem: Clinical Measurements: Goal: Ability to maintain clinical measurements within normal limits will improve Outcome: Progressing Goal: Will remain free from infection Outcome: Progressing Goal: Diagnostic test results will improve Outcome: Progressing Goal: Cardiovascular complication will be avoided Outcome: Progressing   Problem: Activity: Goal: Risk for activity intolerance will decrease Outcome: Progressing   Problem: Nutrition: Goal: Adequate nutrition will be maintained Outcome: Progressing   Problem: Coping: Goal: Level of anxiety will decrease Outcome: Progressing   Problem: Elimination: Goal: Will not experience complications related to bowel motility Outcome: Progressing   Problem: Pain Managment: Goal: General experience of comfort will improve Outcome: Progressing   Problem: Safety: Goal: Ability to remain free from injury will improve Outcome: Progressing   Problem: Education: Goal: Ability to demonstrate management of disease process will improve Outcome: Progressing Goal: Ability to verbalize understanding of medication therapies will improve Outcome: Progressing Goal: Individualized Educational Video(s) Outcome: Progressing

## 2020-06-04 NOTE — Progress Notes (Signed)
Patient ID: Robert [name], male   DOB: [birth year], 69 y.o.   MRN: [id number] S: no new complaints but knows that he is retaining fluid and gaining weight again. O:BP 100/72 (BP Location: Left Arm)   Pulse 82   Temp 97.8 F (36.6 C) (Oral)   Resp 19   Ht 6' (1.829 m)   Wt 103.1 kg   SpO2 93%   BMI 30.83 kg/m   Intake/Output Summary (Last 24 hours) at 06/04/2020 0924 Last data filed at 06/04/2020 0457 Gross per 24 hour  Intake 502.32 ml  Output 400 ml  Net 102.32 ml   Intake/Output: I/O last 3 completed shifts: In: 1212.3 [P.O.:1137; I.V.:22; IV Piggyback:53.3] Out: 400 [Urine:400]  Intake/Output this shift:  No intake/output data recorded. Weight change: 4.1 kg Gen: NAD CVS: IRR IRR Resp: decreased BS at bases Abd: +BS, soft Ext: 2+ pitting edema on right 1 + on left lower ext  Recent Labs  Lab 05/29/20 0343 05/29/20 1541 05/30/20 0416 05/30/20 1605 05/31/20 1528 05/31/20 1528 06/01/20 0322 06/01/20 1600 06/01/20 2256 06/02/20 0338 06/02/20 1600 06/03/20 0508 06/04/20 0343  NA 134*   < > 135   < > 134*   < > 135 131* 137 134* 131* 131* 122*  K 4.2   < > 4.2   < > 4.4   < > 4.5 4.8 4.5 5.3* 5.5* 5.3* 5.4*  CL 99   < > 101   < > 101  --  101 98  --  103 100 100 91*  CO2 26   < > 25   < > 26  --  25 25  --  24 25 23  21*  GLUCOSE 170*   < > 128*   < > 133*  --  167* 335*  --  185* 267* 173* 362*  BUN 78*   < > 51*   < > 24*  --  19 24*  --  22 28* 41* 67*  CREATININE 1.31*   < > 1.49*   < > 1.26*  --  1.32* 1.76*  --  1.70* 2.08* 2.46* 2.88*  ALBUMIN 2.9*   < > 2.9*   < > 3.0*  --  3.0* 2.8*  --  3.0* 2.9* 2.6* 2.6*  CALCIUM 8.5*   < > 8.2*   < > 8.5*  --  8.6* 8.1*  --  8.0* 8.4* 8.8* 8.5*  PHOS 2.9   < > 2.8   < > 2.2*  --  2.7 2.9  --  2.9 3.4 4.9* 5.6*  AST 79*  --  76*  --   --   --   --   --   --   --   --   --   --   ALT 179*  --  179*  --   --   --   --   --   --   --   --   --   --    < > = values in this interval not displayed.   Liver Function  Tests: Recent Labs  Lab 05/29/20 0343 05/29/20 1541 05/30/20 0416 05/30/20 1605 06/02/20 1600 06/03/20 0508 06/04/20 0343  AST 79*  --  76*  --   --   --   --   ALT 179*  --  179*  --   --   --   --   ALKPHOS 157*  --  172*  --   --   --   --  BILITOT 0.8  --  0.8  --   --   --   --   PROT 6.1*  --  6.4*  --   --   --   --   ALBUMIN 2.9*   < > 2.9*   < > 2.9* 2.6* 2.6*   < > = values in this interval not displayed.   No results for input(s): LIPASE, AMYLASE in the last 168 hours. No results for input(s): AMMONIA in the last 168 hours. CBC: Recent Labs  Lab 05/31/20 0401 05/31/20 0401 06/01/20 0322 06/01/20 2256 06/02/20 0338 06/02/20 0338 06/03/20 0508 06/03/20 1555 06/04/20 0343  WBC 11.7*   < > 11.6*  --  17.3*  --  11.4*  --  14.6*  HGB 13.8   < > 13.2   < > 13.1  --  11.5*  --  11.7*  HCT 43.2   < > 41.0   < > 41.6  --  36.7*  --  36.4*  MCV 94.7  --  95.6  --  96.3  --  98.1  --  95.0  PLT 125*   < > 106*  --  75*   < > 56* 55* 68*   < > = values in this interval not displayed.   Cardiac Enzymes: No results for input(s): CKTOTAL, CKMB, CKMBINDEX, TROPONINI in the last 168 hours. CBG: Recent Labs  Lab 06/03/20 0813 06/03/20 1109 06/03/20 1544 06/03/20 2113 06/04/20 0625  GLUCAP 336* 299* 395* 352* 308*    Iron Studies: No results for input(s): IRON, TIBC, TRANSFERRIN, FERRITIN in the last 72 hours. Studies/Results: No results found. [name] amiodarone  200 mg Oral BID  . B-complex with vitamin C  1 tablet Oral Daily  . Chlorhexidine Gluconate Cloth  6 each Topical Daily  . dextromethorphan-guaiFENesin  1 tablet Oral BID  . feeding supplement (ENSURE ENLIVE)  237 mL Oral BID BM  . fluticasone  1 spray Each Nare Daily  . folic acid  1 mg Oral Daily  . gabapentin  300 mg Oral TID  . influenza vaccine adjuvanted  0.5 mL Intramuscular Tomorrow-1000  . insulin aspart  0-20 Units Subcutaneous TID WC  . insulin aspart  6 Units Subcutaneous TID WC  . insulin  detemir  15 Units Subcutaneous BID  . ipratropium-albuterol  3 mL Nebulization BID  . melatonin  3 mg Oral QHS  . metoprolol tartrate  25 mg Oral BID  . midodrine  10 mg Oral TID WC  . mometasone-formoterol  2 puff Inhalation BID  . primidone  100 mg Oral QHS  . sodium chloride flush  10-40 mL Intracatheter Q12H  . sodium chloride flush  3 mL Intravenous Q12H  . sodium chloride flush  3 mL Intravenous Q12H  . tamsulosin  0.4 mg Oral Daily    BMET    Component Value Date/Time   NA 122 (L) 06/04/2020 0343   NA 135 10/17/2019 1628   K 5.4 (H) 06/04/2020 0343   CL 91 (L) 06/04/2020 0343   CO2 21 (L) 06/04/2020 0343   GLUCOSE 362 (H) 06/04/2020 0343   BUN 67 (H) 06/04/2020 0343   BUN 34 (H) 10/17/2019 1628   CREATININE 2.88 (H) 06/04/2020 0343   CALCIUM 8.5 (L) 06/04/2020 0343   GFRNONAA 22 (L) 06/04/2020 0343   GFRAA 25 (L) 06/04/2020 0343   CBC    Component Value Date/Time   WBC 14.6 (H) 06/04/2020 0343   RBC 3.83 (L) 06/04/2020 2992  HGB 11.7 (L) 06/04/2020 0343   HGB 16.3 09/25/2017 1135   HCT 36.4 (L) 06/04/2020 0343   HCT 47.1 09/25/2017 1135   PLT 68 (L) 06/04/2020 0343   PLT 159 09/25/2017 1135   MCV 95.0 06/04/2020 0343   MCV 91 09/25/2017 1135   MCH 30.5 06/04/2020 0343   MCHC 32.1 06/04/2020 0343   RDW 14.6 06/04/2020 0343   RDW 14.5 09/25/2017 1135   LYMPHSABS 0.6 (L) 05/20/2020 0629   MONOABS 1.1 (H) 05/20/2020 0629   EOSABS 0.0 05/20/2020 0629   BASOSABS 0.0 05/20/2020 0629     Assessment/Plan:  1. AKI/CKD stage 3b- in setting of acute decompensated CHF with pericardial effusion. Sub-optimal response to IV diuretics and started on CRRT 05/28/20 for better UF. No significant UOP since starting CRRT. Had been producing over 2.5 liters with IV diuretics.  1. Significant improvement of volume with CRRT which was stopped 06/02/20 but has since been gaining weight with poor response to IV lasix. 2. Plan for trial of IHD today. 3. Will increase IV lasix  to 160 mg bid and follow response. 4. BP is still low and may not be able to tolerate IHD.  Discussed the possible need to resume CRRT. 5. Continue midodrine 10 mg tid 6. Continue to follow UOP and Scr 2. Urinary retention- cont with flomax and intermittent straight caths 3. Hyperkalemia - will recheck later today 4. Acute on chronic systolic CHF- cardiology following. Holding off on IV contrast due to AKI/CKD 1. Marked improvement with CRRT and is net negative 25 liters since admission 2. Stopped CRRT on 06/02/20 but not responding to IV lasix.  Plan for IHD and UF as bp tolerates but may need to resume CRRT. 3. Cardiology following and await input. 5. Pericardial effusion s/p pericardiocentesis with improvement 6. Acute hypoxic respiratory failure due to CHF. Improved with volume removal 7. Hyponatremia- due to CHF, now worsening will plan on trial of IHD. 8. COPD 9. DM type 2 per primary 10. A fib- currently rate controlled and on anticoagulation. Per cardiology. Donetta Potts, MD [employer] [phone]

## 2020-06-04 NOTE — Progress Notes (Signed)
Nutrition Follow-up  DOCUMENTATION CODES:   Not applicable  INTERVENTION:   - Continue Ensure Enlive po BID, each supplement provides 350 kcal and 20 grams of protein  - Continue B-complex with vitamin C, will change to renal MVI if pt able to tolerate iHD  NUTRITION DIAGNOSIS:   Increased nutrient needs related to acute illness (AKI on CRRT) as evidenced by estimated needs.  Ongoing, pt now on trial of iHD  GOAL:   Patient will meet greater than or equal to 90% of their needs  Progressing  MONITOR:   PO intake, Supplement acceptance, Weight trends, I & O's, Labs  REASON FOR ASSESSMENT:   Rounds    ASSESSMENT:   Patient with PMH significant for CHF, COPD, CAD, previous COVID 42, GERD, HTN, incarcerated umbilical hernia, bilateral inguinal hernia, DM, ischemic cardiomyopathy, and PAF. Presents this admission with bilateral pulmonary edema.  9/14 - s/p pericardiocentesis  9/17 - start CRRT 9/22 - stop CRRT  Per Nephrology note, plan for trial of iHD today. Nephrology increasing Lasix dose. If pt unable to tolerate, will need to resume CRRT.  Spoke with pt at bedside. Pt reports appetite continues to be good and that he ate well at breakfast. Pt drinking 2 Ensure Enlive supplements daily. He does not think he can drink more than 2. RD encouraged continued adequate PO intake and supplement consumption.  Weight trending back up since CRRT d/c. Pt with +1 pitting generalized edema, non-pitting edema to BUE, and +2 pitting edema to BLE.  Meal Completion: 50-100%  Medications reviewed and include: B-complex with vitamin C, Ensure Enlive BID, folic acid, SSI, Novolog 6 units TID with meals, Levemir 15 units BID, IV Lasix  Labs reviewed: sodium 122, potassium 5.4, phosphorus 5.6 CBG's: 299-395 x 24 hours  UOP: 400 ml x 24 hours I/O's: -24.7 L since admit  Diet Order:   Diet Order            Diet Carb Modified Fluid consistency: Thin; Room service appropriate? Yes;  Fluid restriction: 1200 mL Fluid  Diet effective now                 EDUCATION NEEDS:   Education needs have been addressed  Skin:  Skin Assessment: Skin Integrity Issues: DTI: L left  Last BM:  06/02/20  Height:   Ht Readings from Last 1 Encounters:  05/29/20 6' (1.829 m)    Weight:   Wt Readings from Last 1 Encounters:  06/04/20 103.1 kg    BMI:  Body mass index is 30.83 kg/m.  Estimated Nutritional Needs:   Kcal:  2400-2600 kcal  Protein:  120-135 grams  Fluid:  1000 + UOP    Gaynell Face, MS, RD, LDN Inpatient Clinical Dietitian Please see AMi[facility] for contact information.

## 2020-06-04 NOTE — Progress Notes (Signed)
Results for DAYVEON, HALLEY (MRN [id number]) as of 06/04/2020 11:51  Ref. Range 06/03/2020 15:44 06/03/2020 21:13 06/04/2020 06:25 06/04/2020 11:30 06/04/2020 11:32  Glucose-Capillary Latest Ref Range: 70 - 99 mg/dL 395 (H) 352 (H) 308 (H) 437 (H) 464 (H)  Noted that blood sugars continue to be greater than 250 mg/dl.  Recommend increasing Levemir to 25 units BID, increase Novolog meal coverage to 8 units TID if blood sugars continue to be elevated.  Harvel Ricks RN BSN CDE Diabetes Coordinator Pager: [phone]  8am-5pm

## 2020-06-04 NOTE — Progress Notes (Signed)
PROGRESS NOTE  Robert [name]  DOB: [birth year]  PCP: Redmond School, MD RJJ:[id number]  DOA: 05/26/2020  LOS: 16 days   Chief Complaint  Patient presents with  . Shortness of Breath   Brief narrative: Robert [name] is a 69 y.o. male with PMH of insulin-dependent diabetes mitis, HTN, CAD/MI s/p multiple interventions, chronic combined systolic and diastolic CHF, CKD 3 COPD, peripheral neuropathy, rheumatoid arthritis. Patient presented to the ED on 06/04/2020 with a week history of weight gain, shortness of breath.  He was in respiratory failure.  Chest x-ray showed pulmonary edema. He was admitted for acute exacerbation of CHF. 9/9, echocardiogram showed new systolic diastolic cardiomyopathy with an EF of 35 to 40%.   9/13, repeat echo showed circumferential pericardial effusion and signs of tamponade.  Underwent pericardiocentesis by CT surgery. Patient was continued on maximal dose of IV diuretics with tapering urine output.  His oxygen requirement worsened.  Creatinine worsened.  9/17, Chest x-ray showed large right-sided pleural effusion as well as pulmonary vascular congestion.  Critical care and nephrology consultation were obtained.  HD catheter was placed.  Patient was started on CVVHD.  Subjective: Patient was seen and examined this morning. Pleasant elderly Caucasian male.  Lying down on bed. On 4 L oxygen by nasal cannula. Feels better.  Assessment/Plan: Acute hypoxemic respiratory failure -Multifactorial: CHF, pleural effusion, pericardial effusion -Improving oxygen requirement.  Currently on 4 L oxygen by nasal cannula.  Continue to monitor.  Acute exacerbation chronic combined systolic and diastolic CHF Essential hypertension -EF 35 to 40% -Currently on Lopressor 25 mg twice daily, Lasix 120 mg every 6 hours, midodrine 10 mg 3 times daily -Continue to monitor urine output, daily weight, blood pressure, renal function electrolytes. -Cardiology following.  Bilateral  pleural effusions -Improved pleural effusion on chest x-ray repeated on 9/20.  Pericardial effusion w/ tamponade - status post pericardiocentesis on 9/13  A. fib -Continue amiodarone 200 mg twice daily, Lopressor 25 mg twice daily -Heparin drip was stopped because of drop in platelets. -Currently not on anticoagulation.  AKI on CKD stage IIIb -CRRT done from 9/17-9/22 with significant volume improvement but since it was stopped, patient has been gaining weight and creatinine worsening despite high-dose of IV Lasix. -Nephrology following. May need intermittent hemodialysis. Recent Labs    05/30/20 0416 05/30/20 1605 05/31/20 0401 05/31/20 1528 06/01/20 0322 06/01/20 1600 06/02/20 0338 06/02/20 1600 06/03/20 0508 06/04/20 0343  BUN 51* 38* 29* 24* 19 24* 22 28* 41* 67*  CREATININE 1.49* 1.32* 1.28* 1.26* 1.32* 1.76* 1.70* 2.08* 2.46* 2.88*   Hyperkalemia/hyperphosphatemia -Related to renal failure. Lokelma per nephrology Recent Labs  Lab 05/30/20 0416 05/30/20 1605 05/31/20 0401 05/31/20 1528 06/01/20 0322 06/01/20 0322 06/01/20 1600 06/01/20 1600 06/01/20 2256 06/02/20 0338 06/02/20 1600 06/03/20 0508 06/04/20 0343  K 4.2   < > 4.3   < > 4.5   < > 4.8   < > 4.5 5.3* 5.5* 5.3* 5.4*  MG 2.6*  --  2.6*  --  2.6*  --   --   --   --  2.6*  --  2.4  --   PHOS 2.8   < > 2.4*   < > 2.7   < > 2.9  --   --  2.9 3.4 4.9* 5.6*   < > = values in this interval not displayed.   Hyponatremia -Sodium significantly worse at 122 today. Expect improvement with dialysis. Recent Labs  Lab 05/30/20 1605 05/31/20 0401 05/31/20 1528 06/01/20 0160  06/01/20 1600 06/01/20 2256 06/02/20 0338 06/02/20 1600 06/03/20 0508 06/04/20 0343  NA 135 135 134* 135 131* 137 134* 131* 131* 122*   Diabetes mellitus 2 with peripheral neuropathy -A1c 7.3 on 9/9 -Currently blood sugars running more than 400. -Diabetes care coordinator consult appreciated.   -Increase Levemir to 25 units twice  daily, scheduled NovoLog Premeal to 8 units 3 times daily and sliding-scale insulin. -Continue Neurontin 300 mg 3 times daily Recent Labs  Lab 06/03/20 1544 06/03/20 2113 06/04/20 0625 06/04/20 1130 06/04/20 1132  GLUCAP 395* 352* 308* 437* 464*   Thrombocytopenia -Platelet levels dropping, close to 55. Slightly better today. PF4 antibody sent. No immediate indication of argatroban. Recent Labs  Lab 05/29/20 0343 05/30/20 0416 05/31/20 0401 06/01/20 0322 06/02/20 0338 06/03/20 0508 06/03/20 1555 06/04/20 0343  PLT 177 141* 125* 106* 75* 56* 55* 68*   Leukocytosis -WBC count creeping up, 14.6 today.  -No fever. No other indication of sepsis. Recent Labs  Lab 05/31/20 0401 06/01/20 0322 06/02/20 0338 06/03/20 0508 06/04/20 0343  WBC 11.7* 11.6* 17.3* 11.4* 14.6*   Elevated liver enzymes -Last blood work on 9/19 showed elevated liver enzymes. Repeat labs today. -Obtain acute hepatitis panel as well Recent Labs  Lab 05/29/20 0343 05/29/20 1541 05/30/20 0416 05/30/20 1605 06/01/20 1600 06/02/20 0338 06/02/20 1600 06/03/20 0508 06/04/20 0343  AST 79*  --  76*  --   --   --   --   --   --   ALT 179*  --  179*  --   --   --   --   --   --   ALKPHOS 157*  --  172*  --   --   --   --   --   --   BILITOT 0.8  --  0.8  --   --   --   --   --   --   PROT 6.1*  --  6.4*  --   --   --   --   --   --   ALBUMIN 2.9*   < > 2.9*   < > 2.8* 3.0* 2.9* 2.6* 2.6*   < > = values in this interval not displayed.     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  Pressure injury of skin  - Per documentation of flow sheets  Muscular deconditioning - PT/OT consults  Mobility: Pending PT OT eval Code Status:   Code Status: Full Code  Nutritional status: Body mass index is 31.54 kg/m. Nutrition Problem: Increased  nutrient needs Etiology: acute illness (AKI on CRRT) Signs/Symptoms: estimated needs Diet Order            Diet Carb Modified Fluid consistency: Thin; Room service appropriate? Yes; Fluid restriction: 1200 mL Fluid  Diet effective now                 DVT prophylaxis: Place and maintain sequential compression device Start: 06/03/20 0900 Place TED hose Start: 05/31/20 0933 SCD's Start: 06/07/2020 1942   Antimicrobials:  None Fluid: None Consultants: Nephrology, cardiology, critical care Family Communication:  None at bedside  Status is: Inpatient  Remains inpatient appropriate because:Hemodynamically unstable, Ongoing diagnostic testing needed not appropriate for outpatient work up and IV treatments appropriate due to intensity of illness or inability to take PO   Dispo: The patient is  from: Home              Anticipated d/c is to: SNF most likely              Anticipated d/c date is: > 3 days              Patient currently is not medically stable to d/c.  Infusions:  . sodium chloride    . sodium chloride    . furosemide 120 mg (06/04/20 0522)    Scheduled Meds: . amiodarone  200 mg Oral BID  . B-complex with vitamin C  1 tablet Oral Daily  . Chlorhexidine Gluconate Cloth  6 each Topical Daily  . Chlorhexidine Gluconate Cloth  6 each Topical Q0600  . dextromethorphan-guaiFENesin  1 tablet Oral BID  . feeding supplement (ENSURE ENLIVE)  237 mL Oral BID BM  . fluticasone  1 spray Each Nare Daily  . folic acid  1 mg Oral Daily  . gabapentin  300 mg Oral TID  . influenza vaccine adjuvanted  0.5 mL Intramuscular Tomorrow-1000  . insulin aspart  0-20 Units Subcutaneous TID WC  . insulin aspart  8 Units Subcutaneous TID WC  . insulin detemir  25 Units Subcutaneous BID  . ipratropium-albuterol  3 mL Nebulization BID  . melatonin  3 mg Oral QHS  . metoprolol tartrate  25 mg Oral BID  . midodrine  10 mg Oral TID WC  . mometasone-formoterol  2 puff Inhalation BID  .  primidone  100 mg Oral QHS  . sodium chloride flush  10-40 mL Intracatheter Q12H  . sodium chloride flush  3 mL Intravenous Q12H  . sodium chloride flush  3 mL Intravenous Q12H  . tamsulosin  0.4 mg Oral Daily    Antimicrobials: Anti-infectives (From admission, onward)   None      PRN meds: sodium chloride, sodium chloride, acetaminophen, albuterol, heparin, loperamide, Muscle Rub, ondansetron (ZOFRAN) IV, oxyCODONE, sodium chloride flush, sodium chloride flush, sodium chloride flush   Objective: Vitals:   06/04/20 1152 06/04/20 1221  BP:  99/65  Pulse:  80  Resp:  (!) 23  Temp: 97.8 F (36.6 C) (!) 97.3 F (36.3 C)  SpO2:  97%    Intake/Output Summary (Last 24 hours) at 06/04/2020 1300 Last data filed at 06/04/2020 0457 Gross per 24 hour  Intake 337 ml  Output 400 ml  Net -63 ml   Filed Weights   06/03/20 0600 06/04/20 0447 06/04/20 1221  Weight: 99 kg 103.1 kg 105.5 kg   Weight change: 4.1 kg Body mass index is 31.54 kg/m.   Physical Exam: General exam: Appears calm and comfortable. Not in physical distress Skin: No rashes, lesions or ulcers. HEENT: Atraumatic, normocephalic, supple neck, no obvious bleeding Lungs: Diminished air entry in bases CVS: Regular rate and rhythm, no murmur GI/Abd soft, nontender, nondistended, bowel sound present CNS: Alert, awake, oriented to place and person Psychiatry: Mood appropriate Extremities: 1+ pedal edema bilaterally  Data Review: I have personally reviewed the laboratory data and studies available.  Recent Labs  Lab 05/31/20 0401 05/31/20 0401 06/01/20 0322 06/01/20 2256 06/02/20 0338 06/03/20 0508 06/03/20 1555 06/04/20 0343  WBC 11.7*  --  11.6*  --  17.3* 11.4*  --  14.6*  HGB 13.8   < > 13.2 15.3 13.1 11.5*  --  11.7*  HCT 43.2   < > 41.0 45.0 41.6 36.7*  --  36.4*  MCV 94.7  --  95.6  --  96.3  98.1  --  95.0  PLT 125*   < > 106*  --  75* 56* 55* 68*   < > = values in this interval not displayed.    Recent Labs  Lab 05/30/20 0416 05/30/20 1605 05/31/20 0401 05/31/20 1528 06/01/20 0322 06/01/20 0322 06/01/20 1600 06/01/20 1600 06/01/20 2256 06/02/20 0338 06/02/20 1600 06/03/20 0508 06/04/20 0343  NA 135   < > 135   < > 135   < > 131*   < > 137 134* 131* 131* 122*  K 4.2   < > 4.3   < > 4.5   < > 4.8   < > 4.5 5.3* 5.5* 5.3* 5.4*  CL 101   < > 102   < > 101   < > 98  --   --  103 100 100 91*  CO2 25   < > 25   < > 25   < > 25  --   --  24 25 23  21*  GLUCOSE 128*   < > 128*   < > 167*   < > 335*  --   --  185* 267* 173* 362*  BUN 51*   < > 29*   < > 19   < > 24*  --   --  22 28* 41* 67*  CREATININE 1.49*   < > 1.28*   < > 1.32*   < > 1.76*  --   --  1.70* 2.08* 2.46* 2.88*  CALCIUM 8.2*   < > 8.5*   < > 8.6*   < > 8.1*  --   --  8.0* 8.4* 8.8* 8.5*  MG 2.6*  --  2.6*  --  2.6*  --   --   --   --  2.6*  --  2.4  --   PHOS 2.8   < > 2.4*   < > 2.7   < > 2.9  --   --  2.9 3.4 4.9* 5.6*   < > = values in this interval not displayed.   F/u labs ordered  Signed, Terrilee Croak, MD [facility] 06/04/2020

## 2020-06-05 LAB — CBC
HCT: 33.5 % — ABNORMAL LOW (ref 39.0–52.0)
Hemoglobin: 10.6 g/dL — ABNORMAL LOW (ref 13.0–17.0)
MCH: 30.2 pg (ref 26.0–34.0)
MCHC: 31.6 g/dL (ref 30.0–36.0)
MCV: 95.4 fL (ref 80.0–100.0)
Platelets: 62 10*3/uL — ABNORMAL LOW (ref 150–400)
RBC: 3.51 MIL/uL — ABNORMAL LOW (ref 4.22–5.81)
RDW: 14.8 % (ref 11.5–15.5)
WBC: 8.7 10*3/uL (ref 4.0–10.5)
nRBC: 0 % (ref 0.0–0.2)

## 2020-06-05 LAB — GLUCOSE, CAPILLARY
Glucose-Capillary: 219 mg/dL — ABNORMAL HIGH (ref 70–99)
Glucose-Capillary: 281 mg/dL — ABNORMAL HIGH (ref 70–99)
Glucose-Capillary: 414 mg/dL — ABNORMAL HIGH (ref 70–99)
Glucose-Capillary: 484 mg/dL — ABNORMAL HIGH (ref 70–99)
Glucose-Capillary: 398 mg/dL — ABNORMAL HIGH (ref 70–99)
Glucose-Capillary: 371 mg/dL — ABNORMAL HIGH (ref 70–99)

## 2020-06-05 LAB — RENAL FUNCTION PANEL
Albumin: 2.7 g/dL — ABNORMAL LOW (ref 3.5–5.0)
Anion gap: 9 (ref 5–15)
BUN: 49 mg/dL — ABNORMAL HIGH (ref 8–23)
CO2: 25 mmol/L (ref 22–32)
Calcium: 8.5 mg/dL — ABNORMAL LOW (ref 8.9–10.3)
Chloride: 96 mmol/L — ABNORMAL LOW (ref 98–111)
Creatinine, Ser: 2.4 mg/dL — ABNORMAL HIGH (ref 0.61–1.24)
GFR calc Af Amer: 31 mL/min — ABNORMAL LOW (ref >60)
GFR calc non Af Amer: 27 mL/min — ABNORMAL LOW (ref >60)
Glucose, Bld: 218 mg/dL — ABNORMAL HIGH (ref 70–99)
Phosphorus: 4.9 mg/dL — ABNORMAL HIGH (ref 2.5–4.6)
Potassium: 4.8 mmol/L (ref 3.5–5.1)
Sodium: 130 mmol/L — ABNORMAL LOW (ref 135–145)

## 2020-06-05 LAB — COOXEMETRY PANEL
Carboxyhemoglobin: 1 % (ref 0.5–1.5)
Carboxyhemoglobin: 1.6 % — ABNORMAL HIGH (ref 0.5–1.5)
Methemoglobin: 0.7 % (ref 0.0–1.5)
Methemoglobin: 1.3 % (ref 0.0–1.5)
O2 Saturation: 52.6 %
O2 Saturation: 69.8 %
Total hemoglobin: 11.3 g/dL — ABNORMAL LOW (ref 12.0–16.0)
Total hemoglobin: 11.1 g/dL — ABNORMAL LOW (ref 12.0–16.0)

## 2020-06-05 MED ORDER — FUROSEMIDE 10 MG/ML IJ SOLN
20.0000 mg/h | INTRAVENOUS | Status: DC
  Administered 2020-06-05: 20 mg/h via INTRAVENOUS
  Filled 2020-06-05 (×2): qty 25

## 2020-06-05 MED ORDER — MILRINONE LACTATE IN DEXTROSE 20-5 MG/100ML-% IV SOLN
0.1250 ug/kg/min | INTRAVENOUS | Status: DC
  Administered 2020-06-05 – 2020-06-09 (×7): 0.25 ug/kg/min via INTRAVENOUS
  Administered 2020-06-09 – 2020-06-10 (×3): 0.125 ug/kg/min via INTRAVENOUS
  Filled 2020-06-05 (×11): qty 100

## 2020-06-05 MED ORDER — INSULIN DETEMIR 100 UNIT/ML ~~LOC~~ SOLN
30.0000 [IU] | Freq: Two times a day (BID) | SUBCUTANEOUS | Status: DC
  Administered 2020-06-05: 30 [IU] via SUBCUTANEOUS
  Filled 2020-06-05 (×3): qty 0.3

## 2020-06-05 MED ORDER — FUROSEMIDE 10 MG/ML IJ SOLN
120.0000 mg | Freq: Once | INTRAVENOUS | Status: AC
  Administered 2020-06-05: 120 mg via INTRAVENOUS
  Filled 2020-06-05: qty 12

## 2020-06-05 MED ORDER — INSULIN ASPART 100 UNIT/ML ~~LOC~~ SOLN
20.0000 [IU] | Freq: Once | SUBCUTANEOUS | Status: AC
  Administered 2020-06-05: 20 [IU] via SUBCUTANEOUS

## 2020-06-05 NOTE — Progress Notes (Signed)
PROGRESS NOTE  ACESON LABELL  DOB: [birth year]  PCP: Redmond School, MD OQH:[id number]  DOA: 05/14/2020  LOS: 17 days   Chief Complaint  Patient presents with  . Shortness of Breath   Brief narrative: Robert [name] is a 68 y.o. male with PMH of insulin-dependent diabetes mitis, HTN, CAD/MI s/p multiple interventions, chronic combined systolic and diastolic CHF, CKD 3 COPD, peripheral neuropathy, rheumatoid arthritis. Patient presented to the ED on 05/14/2020 with a week history of weight gain, shortness of breath.  He was in respiratory failure.  Chest x-ray showed pulmonary edema. He was admitted for acute exacerbation of CHF. 9/9, echocardiogram showed new systolic diastolic cardiomyopathy with an EF of 35 to 40%.   9/13, repeat echo showed circumferential pericardial effusion and signs of tamponade.  Underwent pericardiocentesis by CT surgery. Patient was continued on maximal dose of IV diuretics with tapering urine output.  His oxygen requirement worsened.  Creatinine worsened.  9/17, Chest x-ray showed large right-sided pleural effusion as well as pulmonary vascular congestion.  Critical care and nephrology consultation were obtained.  HD catheter was placed.  Patient was started on CVVHD.  Subjective: Patient was seen and examined this morning. Lying on bed.  Remains on 4 L oxygen by nasal cannula. Blood pressure running in 90s today.  Assessment/Plan: Acute hypoxemic respiratory failure -Multifactorial: CHF, pleural effusion, pericardial effusion -Improving oxygen requirement.  Currently on 4 L oxygen by nasal cannula.  Continue to monitor.  Acute exacerbation chronic combined systolic and diastolic CHF Essential hypertension -EF 35 to 40% -Currently on Lopressor, Lasix and midodrine.  Cardiology and nephrology following.  Noted a plan to switch to Lasix drip today.  -Continue to monitor urine output, daily weight, blood pressure, renal function electrolytes.  AKI on CKD  stage IIIb -CRRT done from 9/17-9/22 with significant volume improvement but since it was stopped, patient has been gaining weight and creatinine worsening despite high-dose of IV Lasix. -9/24, patient had hemodialysis done but was only able to remove 1.3 L.  Noted a plan to restart Lasix drip today.  Continue to monitor urine output. Recent Labs    05/30/20 1605 05/31/20 0401 05/31/20 1528 06/01/20 0322 06/01/20 1600 06/02/20 0338 06/02/20 1600 06/03/20 0508 06/04/20 0343 06/05/20 0500  BUN 38* 29* 24* 19 24* 22 28* 41* 67* 49*  CREATININE 1.32* 1.28* 1.26* 1.32* 1.76* 1.70* 2.08* 2.46* 2.88* 2.40*   Bilateral pleural effusions -Improved pleural effusion on chest x-ray repeated on 9/20.  Pericardial effusion w/ tamponade  - status post pericardiocentesis on 9/13  A. fib -Continue amiodarone 200 mg twice daily, Lopressor 25 mg twice daily -Heparin drip was stopped because of drop in platelets. -Currently not on anticoagulation.  Hyperkalemia/hyperphosphatemia -Related to renal failure. Lokelma per nephrology Recent Labs  Lab 05/30/20 0416 05/30/20 1605 05/31/20 0401 05/31/20 1528 06/01/20 0322 06/01/20 1600 06/02/20 0338 06/02/20 1600 06/03/20 0508 06/04/20 0343 06/05/20 0500  K 4.2   < > 4.3   < > 4.5   < > 5.3* 5.5* 5.3* 5.4* 4.8  MG 2.6*  --  2.6*  --  2.6*  --  2.6*  --  2.4  --   --   PHOS 2.8   < > 2.4*   < > 2.7   < > 2.9 3.4 4.9* 5.6* 4.9*   < > = values in this interval not displayed.   Hyponatremia -Sodium improved from 122 yesterday to 130 today.  Continue to monitor. Recent Labs  Lab 05/31/20 0401 05/31/20  1528 06/01/20 0322 06/01/20 1600 06/01/20 2256 06/02/20 0338 06/02/20 1600 06/03/20 0508 06/04/20 0343 06/05/20 0500  NA 135 134* 135 131* 137 134* 131* 131* 122* 130*   Diabetes mellitus 2 with peripheral neuropathy -A1c 7.3 on 9/9 -Diabetes care coordinator consult appreciated.  -Blood sugar still running high over 200  fasting -Increase Levemir to 30 units twice daily, scheduled NovoLog Premeal to 8 units 3 times daily and sliding-scale insulin. -Continue Neurontin 300 mg 3 times daily Recent Labs  Lab 06/04/20 1132 06/04/20 1747 06/04/20 2127 06/05/20 0632 06/05/20 1121  GLUCAP 464* 189* 298* 219* 281*   Thrombocytopenia -Platelet level running low.  PF4 antibody sent. No immediate indication of argatroban. Recent Labs  Lab 05/30/20 0416 05/31/20 0401 06/01/20 0322 06/02/20 0338 06/03/20 0508 06/03/20 1555 06/04/20 0343 06/05/20 0500  PLT 141* 125* 106* 75* 56* 55* 68* 62*   Elevated liver enzymes -Improving.  Negative acute hepatitis panel. Recent Labs  Lab 05/30/20 0416 05/30/20 1605 06/02/20 1600 06/03/20 0508 06/04/20 0343 06/04/20 1259 06/05/20 0500  AST 76*  --   --   --   --  31  --   ALT 179*  --   --   --   --  71*  --   ALKPHOS 172*  --   --   --   --  135*  --   BILITOT 0.8  --   --   --   --  0.5  --   PROT 6.4*  --   --   --   --  5.2*  --   ALBUMIN 2.9*   < > 2.9* 2.6* 2.6* 2.5* 2.7*   < > = values in this interval not displayed.   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  Pressure injury of skin  - Per documentation of flow sheets  Muscular deconditioning - PT/OT consults  Mobility: Pending PT OT eval Code Status:   Code Status: Full Code  Nutritional status: Body mass index is 31.28 kg/m. Nutrition Problem: Increased nutrient needs Etiology: acute illness (AKI on CRRT) Signs/Symptoms: estimated needs Diet Order            Diet Carb Modified Fluid consistency: Thin; Room service appropriate?  Yes; Fluid restriction: 1200 mL Fluid  Diet effective now                 DVT prophylaxis: Place and maintain sequential compression device Start: 06/03/20 0900 Place TED hose Start: 05/31/20 0933 SCD's Start: 05/22/2020 1942   Antimicrobials:  None Fluid: None Consultants: Nephrology, cardiology, critical care Family Communication:  None at bedside  Status is: Inpatient  Remains inpatient appropriate because:Hemodynamically unstable, Ongoing diagnostic testing needed not appropriate for outpatient work up and IV treatments appropriate due to intensity of illness or inability to take PO   Dispo: The patient is from: Home              Anticipated d/c is to: SNF most likely              Anticipated d/c date is: > 3 days  Patient currently is not medically stable to d/c.  Infusions:  . sodium chloride    . sodium chloride    . furosemide 120 mg (06/05/20 1317)   Followed by  . furosemide (LASIX) infusion    . milrinone 0.25 mcg/kg/min (06/05/20 1326)    Scheduled Meds: . amiodarone  200 mg Oral BID  . B-complex with vitamin C  1 tablet Oral Daily  . Chlorhexidine Gluconate Cloth  6 each Topical Daily  . Chlorhexidine Gluconate Cloth  6 each Topical Q0600  . dextromethorphan-guaiFENesin  1 tablet Oral BID  . feeding supplement (ENSURE ENLIVE)  237 mL Oral BID BM  . fluticasone  1 spray Each Nare Daily  . folic acid  1 mg Oral Daily  . gabapentin  300 mg Oral TID  . influenza vaccine adjuvanted  0.5 mL Intramuscular Tomorrow-1000  . insulin aspart  0-20 Units Subcutaneous TID WC  . insulin aspart  8 Units Subcutaneous TID WC  . insulin detemir  30 Units Subcutaneous BID  . ipratropium-albuterol  3 mL Nebulization BID  . melatonin  3 mg Oral QHS  . metoprolol tartrate  25 mg Oral BID  . midodrine  10 mg Oral TID WC  . mometasone-formoterol  2 puff Inhalation BID  . primidone  100 mg Oral QHS  . sodium chloride flush  10-40 mL Intracatheter Q12H  . sodium  chloride flush  3 mL Intravenous Q12H  . sodium chloride flush  3 mL Intravenous Q12H  . tamsulosin  0.4 mg Oral Daily    Antimicrobials: Anti-infectives (From admission, onward)   None      PRN meds: sodium chloride, sodium chloride, acetaminophen, albuterol, heparin, loperamide, Muscle Rub, ondansetron (ZOFRAN) IV, oxyCODONE, sodium chloride flush, sodium chloride flush, sodium chloride flush   Objective: Vitals:   06/05/20 0932 06/05/20 1229  BP:  90/76  Pulse: 88 83  Resp: 16 18  Temp:  98.5 F (36.9 C)  SpO2: 95% 92%    Intake/Output Summary (Last 24 hours) at 06/05/2020 1350 Last data filed at 06/05/2020 0800 Gross per 24 hour  Intake 240 ml  Output 1320 ml  Net -1080 ml   Filed Weights   06/04/20 0447 06/04/20 1221 06/05/20 0654  Weight: 103.1 kg 105.5 kg 104.6 kg   Weight change: 2.4 kg Body mass index is 31.28 kg/m.   Physical Exam: General exam: Appears calm and comfortable. Not in physical distress Skin: No rashes, lesions or ulcers. HEENT: Atraumatic, normocephalic, supple neck, no obvious bleeding Lungs: Diminished air entry in bases.  Remains on supplemental oxygen. CVS: Regular rate and rhythm, no murmur GI/Abd soft, nontender, nondistended, bowel sound present CNS: Alert, awake, oriented to place and person Psychiatry: Mood appropriate Extremities: 1+ pedal edema bilaterally  Data Review: I have personally reviewed the laboratory data and studies available.  Recent Labs  Lab 06/01/20 0322 06/01/20 0322 06/01/20 2256 06/02/20 0338 06/03/20 0508 06/03/20 1555 06/04/20 0343 06/05/20 0500  WBC 11.6*  --   --  17.3* 11.4*  --  14.6* 8.7  HGB 13.2   < > 15.3 13.1 11.5*  --  11.7* 10.6*  HCT 41.0   < > 45.0 41.6 36.7*  --  36.4* 33.5*  MCV 95.6  --   --  96.3 98.1  --  95.0 95.4  PLT 106*   < >  --  75* 56* 55* 68* 62*   < > = values in this interval not displayed.   Recent Labs  Lab  05/30/20 0416 05/30/20 1605 05/31/20 0401  05/31/20 1528 06/01/20 0322 06/01/20 1600 06/02/20 0338 06/02/20 1600 06/03/20 0508 06/04/20 0343 06/05/20 0500  NA 135   < > 135   < > 135   < > 134* 131* 131* 122* 130*  K 4.2   < > 4.3   < > 4.5   < > 5.3* 5.5* 5.3* 5.4* 4.8  CL 101   < > 102   < > 101   < > 103 100 100 91* 96*  CO2 25   < > 25   < > 25   < > 24 25 23  21* 25  GLUCOSE 128*   < > 128*   < > 167*   < > 185* 267* 173* 362* 218*  BUN 51*   < > 29*   < > 19   < > 22 28* 41* 67* 49*  CREATININE 1.49*   < > 1.28*   < > 1.32*   < > 1.70* 2.08* 2.46* 2.88* 2.40*  CALCIUM 8.2*   < > 8.5*   < > 8.6*   < > 8.0* 8.4* 8.8* 8.5* 8.5*  MG 2.6*  --  2.6*  --  2.6*  --  2.6*  --  2.4  --   --   PHOS 2.8   < > 2.4*   < > 2.7   < > 2.9 3.4 4.9* 5.6* 4.9*   < > = values in this interval not displayed.   F/u labs ordered  Signed, Terrilee Croak, MD [facility] 06/05/2020

## 2020-06-05 NOTE — Progress Notes (Addendum)
Patient's cbg is 18, dr dahal advised, verbal order given for 28 units of insulin coverage now---repeated back for understanding----rechecked patient's cbg one hour later, resulting at 484, advised dr Pietro Cassis, he has given one time order for 20- units insulin now, patient advised

## 2020-06-05 NOTE — Progress Notes (Signed)
Per dr Marval Regal, as long as MAP remains above 60, do not hold IV lasix for soft blood pressures, patient is asymptomatic and has no problems with ambulation due to low blood pressure, IV lasix drip will be ordered soon

## 2020-06-05 NOTE — Progress Notes (Signed)
Patient ID: EWELL BENASSI, male   DOB: 08-Mar-1951, 69 y.o.   MRN: [id number] S: No new complaints this morning.  Reports dialysis "wore me out".  Lasix dose was held last night due to hypotension (which is chronic and stable) O:BP (!) 84/60 (BP Location: Left Arm)   Pulse 82   Temp 98.6 F (37 C) (Oral)   Resp 16   Ht 6' (1.829 m)   Wt 104.6 kg   SpO2 99%   BMI 31.28 kg/m   Intake/Output Summary (Last 24 hours) at 06/05/2020 2202 Last data filed at 06/05/2020 0800 Gross per 24 hour  Intake 240 ml  Output 1320 ml  Net -1080 ml   Intake/Output: I/O last 3 completed shifts: In: -  Out: 5427 [Urine:400; Other:1320]  Intake/Output this shift:  Total I/O In: 240 [P.O.:240] Out: -  Weight change: 2.4 kg Gen: chronically ill-appearing WM in NAD CVS: no rub Resp: cta Abd: +BS, soft, NT/ND Ext:2+ edema of RLE 1 + edema LLE  Recent Labs  Lab 05/30/20 0416 05/30/20 1605 06/01/20 0322 06/01/20 0322 06/01/20 1600 06/01/20 2256 06/02/20 0338 06/02/20 1600 06/03/20 0508 06/04/20 0343 06/04/20 1259 06/05/20 0500  NA 135   < > 135   < > 131* 137 134* 131* 131* 122*  --  130*  K 4.2   < > 4.5   < > 4.8 4.5 5.3* 5.5* 5.3* 5.4*  --  4.8  CL 101   < > 101  --  98  --  103 100 100 91*  --  96*  CO2 25   < > 25  --  25  --  24 25 23  21*  --  25  GLUCOSE 128*   < > 167*  --  335*  --  185* 267* 173* 362*  --  218*  BUN 51*   < > 19  --  24*  --  22 28* 41* 67*  --  49*  CREATININE 1.49*   < > 1.32*  --  1.76*  --  1.70* 2.08* 2.46* 2.88*  --  2.40*  ALBUMIN 2.9*   < > 3.0*   < > 2.8*  --  3.0* 2.9* 2.6* 2.6* 2.5* 2.7*  CALCIUM 8.2*   < > 8.6*  --  8.1*  --  8.0* 8.4* 8.8* 8.5*  --  8.5*  PHOS 2.8   < > 2.7  --  2.9  --  2.9 3.4 4.9* 5.6*  --  4.9*  AST 76*  --   --   --   --   --   --   --   --   --  31  --   ALT 179*  --   --   --   --   --   --   --   --   --  71*  --    < > = values in this interval not displayed.   Liver Function Tests: Recent Labs  Lab 05/30/20 0416  05/30/20 1605 06/04/20 0343 06/04/20 1259 06/05/20 0500  AST 76*  --   --  31  --   ALT 179*  --   --  71*  --   ALKPHOS 172*  --   --  135*  --   BILITOT 0.8  --   --  0.5  --   PROT 6.4*  --   --  5.2*  --   ALBUMIN 2.9*   < >  2.6* 2.5* 2.7*   < > = values in this interval not displayed.   No results for input(s): LIPASE, AMYLASE in the last 168 hours. No results for input(s): AMMONIA in the last 168 hours. CBC: Recent Labs  Lab 06/01/20 0322 06/01/20 2256 06/02/20 0338 06/02/20 0338 06/03/20 0508 06/03/20 0508 06/03/20 1555 06/04/20 0343 06/05/20 0500  WBC 11.6*  --  17.3*   < > 11.4*  --   --  14.6* 8.7  HGB 13.2   < > 13.1   < > 11.5*  --   --  11.7* 10.6*  HCT 41.0   < > 41.6   < > 36.7*  --   --  36.4* 33.5*  MCV 95.6  --  96.3  --  98.1  --   --  95.0 95.4  PLT 106*  --  75*   < > 56*   < > 55* 68* 62*   < > = values in this interval not displayed.   Cardiac Enzymes: No results for input(s): CKTOTAL, CKMB, CKMBINDEX, TROPONINI in the last 168 hours. CBG: Recent Labs  Lab 06/04/20 1130 06/04/20 1132 06/04/20 1747 06/04/20 2127 06/05/20 0632  GLUCAP 437* 464* 189* 298* 219*    Iron Studies: No results for input(s): IRON, TIBC, TRANSFERRIN, FERRITIN in the last 72 hours. Studies/Results: No results found. [name] amiodarone  200 mg Oral BID  . B-complex with vitamin C  1 tablet Oral Daily  . Chlorhexidine Gluconate Cloth  6 each Topical Daily  . Chlorhexidine Gluconate Cloth  6 each Topical Q0600  . dextromethorphan-guaiFENesin  1 tablet Oral BID  . feeding supplement (ENSURE ENLIVE)  237 mL Oral BID BM  . fluticasone  1 spray Each Nare Daily  . folic acid  1 mg Oral Daily  . gabapentin  300 mg Oral TID  . influenza vaccine adjuvanted  0.5 mL Intramuscular Tomorrow-1000  . insulin aspart  0-20 Units Subcutaneous TID WC  . insulin aspart  8 Units Subcutaneous TID WC  . insulin detemir  25 Units Subcutaneous BID  . ipratropium-albuterol  3 mL Nebulization  BID  . melatonin  3 mg Oral QHS  . metoprolol tartrate  25 mg Oral BID  . midodrine  10 mg Oral TID WC  . mometasone-formoterol  2 puff Inhalation BID  . primidone  100 mg Oral QHS  . sodium chloride flush  10-40 mL Intracatheter Q12H  . sodium chloride flush  3 mL Intravenous Q12H  . sodium chloride flush  3 mL Intravenous Q12H  . tamsulosin  0.4 mg Oral Daily    BMET    Component Value Date/Time   NA 130 (L) 06/05/2020 0500   NA 135 10/17/2019 1628   K 4.8 06/05/2020 0500   CL 96 (L) 06/05/2020 0500   CO2 25 06/05/2020 0500   GLUCOSE 218 (H) 06/05/2020 0500   BUN 49 (H) 06/05/2020 0500   BUN 34 (H) 10/17/2019 1628   CREATININE 2.40 (H) 06/05/2020 0500   CALCIUM 8.5 (L) 06/05/2020 0500   GFRNONAA 27 (L) 06/05/2020 0500   GFRAA 31 (L) 06/05/2020 0500   CBC    Component Value Date/Time   WBC 8.7 06/05/2020 0500   RBC 3.51 (L) 06/05/2020 0500   HGB 10.6 (L) 06/05/2020 0500   HGB 16.3 09/25/2017 1135   HCT 33.5 (L) 06/05/2020 0500   HCT 47.1 09/25/2017 1135   PLT 62 (L) 06/05/2020 0500   PLT 159 09/25/2017 1135   MCV 95.4  06/05/2020 0500   MCV 91 09/25/2017 1135   MCH 30.2 06/05/2020 0500   MCHC 31.6 06/05/2020 0500   RDW 14.8 06/05/2020 0500   RDW 14.5 09/25/2017 1135   LYMPHSABS 0.6 (L) 05/20/2020 0629   MONOABS 1.1 (H) 05/20/2020 0629   EOSABS 0.0 05/20/2020 0629   BASOSABS 0.0 05/20/2020 0629    Assessment/Plan:  1. AKI/CKD stage 3b- in setting of acute decompensated CHF with pericardial effusion. Sub-optimal response to IV diuretics and started on CRRT 05/28/20 for better UF. No significant UOP since starting CRRT. Had been producing over 2.5 liters with IV diuretics.  1. Significant improvement of volume with CRRTwhich was stopped 06/02/20 but has since been gaining weight with poor response to IV lasix. 2. Only able to UF 1.3 L of fluid with HD on 06/04/20 3. Will restart lasix drip and see if his UOP improves over the weekend. 4. Plan for HD again on  Monday if no significant improvement of fluid overload 5. BP is still low and may not be able to tolerate IHD. Discussed the possible need to resume CRRT. 6. Continue midodrine 10 mg tid 7. Continue to follow UOP and Scr 8. Do not hold lasix if MAP is >60 and pt is asymptomatic 2. Urinary retention- cont with flomax and intermittent straight caths 3. Hyperkalemia - will recheck later today 4. Acute on chronic systolic CHF- cardiology following. Holding off on IV contrast due to AKI/CKD 1. Marked improvement with CRRT and is net negative 25 liters since admission 2. StoppedCRRT on 06/02/20 but not responding to IV lasix.  Plan for IHD and UF as bp tolerates but may need to resume CRRT. 3. Cardiology following and await input. 5. Pericardial effusion s/p pericardiocentesis with improvement 6. Acute hypoxic respiratory failure due to CHF. Improved with volume removal 7. Hyponatremia- due to CHF, now worsening will plan on trial of IHD. 8. COPD 9. DM type 2 per primary 10. A fib- currently rate controlled and on anticoagulation. Per cardiology.  Donetta Potts, MD [employer] [phone]

## 2020-06-05 NOTE — Progress Notes (Signed)
Progress Note  Patient Name: Robert [name] Date of Encounter: 06/05/2020  [facility] Cardiologist: Sanda Klein, MD   Subjective   Feels that he is improving. Walked a few steps in hallway. Still markedly edematous, even as weight is down >30 lbs from peak on this admission. Now getting closer to previous estimated "dry weight" of 220 lb, but still with marked dependent edema. BP low, but he is alert and asymptomatic. Remains in AFib, rate controlled.  Inpatient Medications    Scheduled Meds:  amiodarone  200 mg Oral BID   B-complex with vitamin C  1 tablet Oral Daily   Chlorhexidine Gluconate Cloth  6 each Topical Daily   Chlorhexidine Gluconate Cloth  6 each Topical Q0600   dextromethorphan-guaiFENesin  1 tablet Oral BID   feeding supplement (ENSURE ENLIVE)  237 mL Oral BID BM   fluticasone  1 spray Each Nare Daily   folic acid  1 mg Oral Daily   gabapentin  300 mg Oral TID   influenza vaccine adjuvanted  0.5 mL Intramuscular Tomorrow-1000   insulin aspart  0-20 Units Subcutaneous TID WC   insulin aspart  8 Units Subcutaneous TID WC   insulin detemir  25 Units Subcutaneous BID   ipratropium-albuterol  3 mL Nebulization BID   melatonin  3 mg Oral QHS   metoprolol tartrate  25 mg Oral BID   midodrine  10 mg Oral TID WC   mometasone-formoterol  2 puff Inhalation BID   primidone  100 mg Oral QHS   sodium chloride flush  10-40 mL Intracatheter Q12H   sodium chloride flush  3 mL Intravenous Q12H   sodium chloride flush  3 mL Intravenous Q12H   tamsulosin  0.4 mg Oral Daily   Continuous Infusions:  sodium chloride     sodium chloride     furosemide     Followed by   furosemide (LASIX) infusion     PRN Meds: sodium chloride, sodium chloride, acetaminophen, albuterol, heparin, loperamide, Muscle Rub, ondansetron (ZOFRAN) IV, oxyCODONE, sodium chloride flush, sodium chloride flush, sodium chloride flush   Vital Signs    Vitals:    06/05/20 0600 06/05/20 0654 06/05/20 0800 06/05/20 0932  BP: 96/60  (!) 84/60   Pulse: 82  82 88  Resp: 20  16 16   Temp:   98.6 F (37 C)   TempSrc:   Oral   SpO2: 95%  99% 95%  Weight:  104.6 kg    Height:        Intake/Output Summary (Last 24 hours) at 06/05/2020 0943 Last data filed at 06/05/2020 0800 Gross per 24 hour  Intake 240 ml  Output 1320 ml  Net -1080 ml   Last 3 Weights 06/05/2020 06/04/2020 06/04/2020  Weight (lbs) 230 lb 9.6 oz 232 lb 9.4 oz 227 lb 4.7 oz  Weight (kg) 104.6 kg 105.5 kg 103.1 kg      Telemetry    AFib - Personally Reviewed  ECG    No new tracing - Personally Reviewed  Physical Exam   GEN: No acute distress.   Neck: cannot see JVD with his obesity and really thick beard Cardiac: irregular, no murmurs, rubs, or gallops.  Respiratory: Clear to auscultation bilaterally. GI: Soft, nontender, non-distended  MS: 3+ pitting bilateral pretibial edema; No deformity. Neuro:  Nonfocal  Psych: Normal affect   Labs    High Sensitivity Troponin:   Recent Labs  Lab 05/16/20 1228 05/16/20 1436 05/29/2020 1310 05/14/2020 1653  TROPONINIHS 10  10 9 10       Chemistry Recent Labs  Lab 05/30/20 0416 05/30/20 1605 06/03/20 0508 06/03/20 0508 06/04/20 0343 06/04/20 1259 06/05/20 0500  NA 135   < > 131*  --  122*  --  130*  K 4.2   < > 5.3*  --  5.4*  --  4.8  CL 101   < > 100  --  91*  --  96*  CO2 25   < > 23  --  21*  --  25  GLUCOSE 128*   < > 173*  --  362*  --  218*  BUN 51*   < > 41*  --  67*  --  49*  CREATININE 1.49*   < > 2.46*  --  2.88*  --  2.40*  CALCIUM 8.2*   < > 8.8*  --  8.5*  --  8.5*  PROT 6.4*  --   --   --   --  5.2*  --   ALBUMIN 2.9*   < > 2.6*   < > 2.6* 2.5* 2.7*  AST 76*  --   --   --   --  31  --   ALT 179*  --   --   --   --  71*  --   ALKPHOS 172*  --   --   --   --  135*  --   BILITOT 0.8  --   --   --   --  0.5  --   GFRNONAA 48*   < > 26*  --  22*  --  27*  GFRAA 55*   < > 30*  --  25*  --  31*  ANIONGAP 9    < > 8  --  10  --  9   < > = values in this interval not displayed.     Hematology Recent Labs  Lab 06/03/20 0508 06/03/20 0508 06/03/20 1555 06/04/20 0343 06/05/20 0500  WBC 11.4*  --   --  14.6* 8.7  RBC 3.74*  --   --  3.83* 3.51*  HGB 11.5*  --   --  11.7* 10.6*  HCT 36.7*  --   --  36.4* 33.5*  MCV 98.1  --   --  95.0 95.4  MCH 30.7  --   --  30.5 30.2  MCHC 31.3  --   --  32.1 31.6  RDW 14.8  --   --  14.6 14.8  PLT 56*   < > 55* 68* 62*   < > = values in this interval not displayed.    BNPNo results for input(s): BNP, PROBNP in the last 168 hours.   DDimer  Recent Labs  Lab 06/03/20 1555  DDIMER 5.16*     Radiology    No results found.  Cardiac Studies   ECHO 05/31/2020:    1. Left ventricular ejection fraction, by estimation, is 35 to 40%. The  left ventricle has moderately decreased function. The left ventricle  demonstrates global hypokinesis.  2. Trivial mitral valve regurgitation.  3. Tricuspid regurgitation signal is inadequate for assessing PA  pressure.  4. The inferior vena cava is dilated in size with <50% respiratory  variability, suggesting right atrial pressure of 15 mmHg.  5. There is no evidence of pericardial effusion.   Patient Profile     70 y.o. male  with pericardial tamponade and biventricular failure (new reduction in LVEF)  complicated  by acute on CKD, paroxysmalatrial fibrillation, on background of longstanding CAD and COPD, DM, PAD, rheumatoid arthritis on immunosuppressive drugs. Pericardiocentesis 1000 mL on 9/14. On ultrafiltration last week, now transitioned back to IV diuretics. Net volume removal 26 liters since admission.  Assessment & Plan    1.  CHF: volume overload initially due to tamponade, compounded by chronic biventricular failure (CAD, COPD) and acute on chronic renal insufficiency. Low BP limits use of HF meds. I wonder if would benefit from inotropic support. Low BP and poor renal function, but otherwise  no overt signs of shock.  May enlist help from HF team. Will check Co-ox. 2. CAD: no angina, wall motion abnormalities largely fit territory of LCx which is probably occluded. 3. Ac on CKD: Dr. Marval Regal seeing, ordered furosemide drip. Oliguric. May need to restart dialysis. 4. AFib: rate controlled, not a candidate for cardioversion since anticoagulation on hold. On midodrine to support BP. As amio fully kicks in will retreat on the beta blocker dose. At some point we need to decide on restarting anticoagulation. 5.  Pericardial effusion: uncertain etiology. Drained 9/14. No recurrence on follow up echo so far. 6. COPD: chronic NYHA 2-3a dyspnea even when well compensated HF 7. PAD: he has known hi-grade stenosis in L SFA and popliteal artery occlusion with reconstitution via collaterals, left foot a little cool and dusky at times.     For questions or updates, please contact [facility] Please consult [email or web address] for contact info under        Signed, Sanda Klein, MD  06/05/2020, 9:44 AM

## 2020-06-06 LAB — RENAL FUNCTION PANEL
Albumin: 2.5 g/dL — ABNORMAL LOW (ref 3.5–5.0)
Albumin: 2.5 g/dL — ABNORMAL LOW (ref 3.5–5.0)
Anion gap: 11 (ref 5–15)
Anion gap: 11 (ref 5–15)
BUN: 68 mg/dL — ABNORMAL HIGH (ref 8–23)
BUN: 71 mg/dL — ABNORMAL HIGH (ref 8–23)
CO2: 22 mmol/L (ref 22–32)
CO2: 22 mmol/L (ref 22–32)
Calcium: 8.5 mg/dL — ABNORMAL LOW (ref 8.9–10.3)
Calcium: 8.2 mg/dL — ABNORMAL LOW (ref 8.9–10.3)
Chloride: 91 mmol/L — ABNORMAL LOW (ref 98–111)
Chloride: 90 mmol/L — ABNORMAL LOW (ref 98–111)
Creatinine, Ser: 2.93 mg/dL — ABNORMAL HIGH (ref 0.61–1.24)
Creatinine, Ser: 3.09 mg/dL — ABNORMAL HIGH (ref 0.61–1.24)
GFR calc Af Amer: 24 mL/min — ABNORMAL LOW (ref >60)
GFR calc Af Amer: 23 mL/min — ABNORMAL LOW (ref >60)
GFR calc non Af Amer: 21 mL/min — ABNORMAL LOW (ref >60)
GFR calc non Af Amer: 20 mL/min — ABNORMAL LOW (ref >60)
Glucose, Bld: 291 mg/dL — ABNORMAL HIGH (ref 70–99)
Glucose, Bld: 443 mg/dL — ABNORMAL HIGH (ref 70–99)
Phosphorus: 5.1 mg/dL — ABNORMAL HIGH (ref 2.5–4.6)
Phosphorus: 5.9 mg/dL — ABNORMAL HIGH (ref 2.5–4.6)
Potassium: 5.1 mmol/L (ref 3.5–5.1)
Potassium: 5.2 mmol/L — ABNORMAL HIGH (ref 3.5–5.1)
Sodium: 124 mmol/L — ABNORMAL LOW (ref 135–145)
Sodium: 123 mmol/L — ABNORMAL LOW (ref 135–145)

## 2020-06-06 LAB — CBC
HCT: 31.7 % — ABNORMAL LOW (ref 39.0–52.0)
HCT: 33.9 % — ABNORMAL LOW (ref 39.0–52.0)
Hemoglobin: 10.3 g/dL — ABNORMAL LOW (ref 13.0–17.0)
Hemoglobin: 10.7 g/dL — ABNORMAL LOW (ref 13.0–17.0)
MCH: 31.1 pg (ref 26.0–34.0)
MCH: 30.3 pg (ref 26.0–34.0)
MCHC: 32.5 g/dL (ref 30.0–36.0)
MCHC: 31.6 g/dL (ref 30.0–36.0)
MCV: 95.8 fL (ref 80.0–100.0)
MCV: 96 fL (ref 80.0–100.0)
Platelets: 80 10*3/uL — ABNORMAL LOW (ref 150–400)
Platelets: 91 10*3/uL — ABNORMAL LOW (ref 150–400)
RBC: 3.31 MIL/uL — ABNORMAL LOW (ref 4.22–5.81)
RBC: 3.53 MIL/uL — ABNORMAL LOW (ref 4.22–5.81)
RDW: 14.6 % (ref 11.5–15.5)
RDW: 14.7 % (ref 11.5–15.5)
WBC: 12 10*3/uL — ABNORMAL HIGH (ref 4.0–10.5)
WBC: 11.3 10*3/uL — ABNORMAL HIGH (ref 4.0–10.5)
nRBC: 0 % (ref 0.0–0.2)
nRBC: 0 % (ref 0.0–0.2)

## 2020-06-06 LAB — GLUCOSE, CAPILLARY
Glucose-Capillary: 259 mg/dL — ABNORMAL HIGH (ref 70–99)
Glucose-Capillary: 416 mg/dL — ABNORMAL HIGH (ref 70–99)
Glucose-Capillary: 437 mg/dL — ABNORMAL HIGH (ref 70–99)
Glucose-Capillary: 434 mg/dL — ABNORMAL HIGH (ref 70–99)
Glucose-Capillary: 290 mg/dL — ABNORMAL HIGH (ref 70–99)

## 2020-06-06 LAB — COOXEMETRY PANEL
Carboxyhemoglobin: 1.6 % — ABNORMAL HIGH (ref 0.5–1.5)
Methemoglobin: 1.3 % (ref 0.0–1.5)
O2 Saturation: 64.7 %
Total hemoglobin: 10.6 g/dL — ABNORMAL LOW (ref 12.0–16.0)

## 2020-06-06 MED ORDER — LIDOCAINE-PRILOCAINE 2.5-2.5 % EX CREA: 1.0000 "application " | TOPICAL_CREAM | CUTANEOUS | Status: DC | PRN

## 2020-06-06 MED ORDER — HEPARIN SODIUM (PORCINE) 1000 UNIT/ML DIALYSIS
1000.0000 [IU] | INTRAMUSCULAR | Status: DC | PRN
  Administered 2020-06-06: 2800 [IU] via INTRAVENOUS_CENTRAL
  Administered 2020-06-14 (×2): 1600 [IU] via INTRAVENOUS_CENTRAL
  Administered 2020-06-14: 2400 [IU] via INTRAVENOUS_CENTRAL
  Filled 2020-06-06: qty 3
  Filled 2020-06-06: qty 4
  Filled 2020-06-06: qty 6

## 2020-06-06 MED ORDER — LIDOCAINE HCL (PF) 1 % IJ SOLN: 5.0000 mL | INTRAMUSCULAR | Status: DC | PRN

## 2020-06-06 MED ORDER — GABAPENTIN 300 MG PO CAPS
300.0000 mg | ORAL_CAPSULE | Freq: Two times a day (BID) | ORAL | Status: DC
  Administered 2020-06-06 – 2020-06-15 (×19): 300 mg via ORAL
  Filled 2020-06-06 (×19): qty 1

## 2020-06-06 MED ORDER — NOREPINEPHRINE 16 MG/250ML-% IV SOLN
2.0000 ug/min | INTRAVENOUS | Status: DC
  Administered 2020-06-06: 2 ug/min via INTRAVENOUS
  Administered 2020-06-08: 7 ug/min via INTRAVENOUS
  Administered 2020-06-10: 5 ug/min via INTRAVENOUS
  Administered 2020-06-11: 7 ug/min via INTRAVENOUS
  Administered 2020-06-14: 8 ug/min via INTRAVENOUS
  Administered 2020-06-15: 7 ug/min via INTRAVENOUS
  Administered 2020-06-15: 8 ug/min via INTRAVENOUS
  Filled 2020-06-06 (×6): qty 250

## 2020-06-06 MED ORDER — INSULIN DETEMIR 100 UNIT/ML ~~LOC~~ SOLN
35.0000 [IU] | Freq: Two times a day (BID) | SUBCUTANEOUS | Status: DC
  Administered 2020-06-06 – 2020-06-07 (×4): 35 [IU] via SUBCUTANEOUS
  Filled 2020-06-06 (×7): qty 0.35

## 2020-06-06 MED ORDER — SODIUM CHLORIDE 0.9 % IV SOLN: 100.0000 mL | INTRAVENOUS | Status: DC | PRN

## 2020-06-06 MED ORDER — PRISMASOL BGK 4/2.5 32-4-2.5 MEQ/L REPLACEMENT SOLN: Status: DC

## 2020-06-06 MED ORDER — PENTAFLUOROPROP-TETRAFLUOROETH EX AERO: 1.0000 "application " | INHALATION_SPRAY | CUTANEOUS | Status: DC | PRN

## 2020-06-06 MED ORDER — SODIUM CHLORIDE 0.9 % IV SOLN
250.0000 [IU]/h | INTRAVENOUS | Status: DC
  Administered 2020-06-06: 250 [IU]/h via INTRAVENOUS_CENTRAL
  Administered 2020-06-07: 1700 [IU]/h via INTRAVENOUS_CENTRAL
  Administered 2020-06-07: 1100 [IU]/h via INTRAVENOUS_CENTRAL
  Administered 2020-06-07: 1700 [IU]/h via INTRAVENOUS_CENTRAL
  Administered 2020-06-07: 1100 [IU]/h via INTRAVENOUS_CENTRAL
  Administered 2020-06-08: 1700 [IU]/h via INTRAVENOUS_CENTRAL
  Administered 2020-06-08: 1750 [IU]/h via INTRAVENOUS_CENTRAL
  Administered 2020-06-08: 1850 [IU]/h via INTRAVENOUS_CENTRAL
  Administered 2020-06-08: 1700 [IU]/h via INTRAVENOUS_CENTRAL
  Administered 2020-06-08: 1850 [IU]/h via INTRAVENOUS_CENTRAL
  Administered 2020-06-09 (×2): 1350 [IU]/h via INTRAVENOUS_CENTRAL
  Administered 2020-06-09: 1850 [IU]/h via INTRAVENOUS_CENTRAL
  Administered 2020-06-09: 1800 [IU]/h via INTRAVENOUS_CENTRAL
  Administered 2020-06-10: 1400 [IU]/h via INTRAVENOUS_CENTRAL
  Administered 2020-06-10: 1350 [IU]/h via INTRAVENOUS_CENTRAL
  Administered 2020-06-10: 1400 [IU]/h via INTRAVENOUS_CENTRAL
  Administered 2020-06-11: 1650 [IU]/h via INTRAVENOUS_CENTRAL
  Administered 2020-06-11: 1950 [IU]/h via INTRAVENOUS_CENTRAL
  Administered 2020-06-11: 1600 [IU]/h via INTRAVENOUS_CENTRAL
  Administered 2020-06-11: 1700 [IU]/h via INTRAVENOUS_CENTRAL
  Administered 2020-06-11: 2050 [IU]/h via INTRAVENOUS_CENTRAL
  Administered 2020-06-11: 1600 [IU]/h via INTRAVENOUS_CENTRAL
  Administered 2020-06-11: 1700 [IU]/h via INTRAVENOUS_CENTRAL
  Administered 2020-06-11: 1550 [IU]/h via INTRAVENOUS_CENTRAL
  Administered 2020-06-11: 2000 [IU]/h via INTRAVENOUS_CENTRAL
  Administered 2020-06-11 – 2020-06-12 (×5): 2050 [IU]/h via INTRAVENOUS_CENTRAL
  Administered 2020-06-13 (×2): 1950 [IU]/h via INTRAVENOUS_CENTRAL
  Administered 2020-06-13: 2050 [IU]/h via INTRAVENOUS_CENTRAL
  Administered 2020-06-13 – 2020-06-14 (×3): 1950 [IU]/h via INTRAVENOUS_CENTRAL
  Filled 2020-06-06 (×33): qty 2

## 2020-06-06 MED ORDER — HEPARIN SODIUM (PORCINE) 1000 UNIT/ML DIALYSIS
1000.0000 [IU] | INTRAMUSCULAR | Status: DC | PRN
  Filled 2020-06-06: qty 1

## 2020-06-06 MED ORDER — PRISMASOL BGK 4/2.5 32-4-2.5 MEQ/L IV SOLN: INTRAVENOUS | Status: DC

## 2020-06-06 MED ORDER — HEPARIN (PORCINE) 2000 UNITS/L FOR CRRT
INTRAVENOUS_CENTRAL | Status: DC | PRN
  Filled 2020-06-06 (×2): qty 1000

## 2020-06-06 MED ORDER — HEPARIN BOLUS VIA INFUSION (CRRT)
1000.0000 [IU] | INTRAVENOUS | Status: DC | PRN
  Administered 2020-06-06: 1000 [IU] via INTRAVENOUS_CENTRAL
  Filled 2020-06-06: qty 1000

## 2020-06-06 MED ORDER — ALTEPLASE 2 MG IJ SOLR: 2.0000 mg | Freq: Once | INTRAMUSCULAR | Status: DC | PRN

## 2020-06-06 NOTE — Progress Notes (Signed)
PROGRESS NOTE  Robert [name]  DOB: 07-14-1951  PCP: Redmond School, MD ZOX:[id number]  DOA: 05/18/2020  LOS: 18 days   Chief Complaint  Patient presents with  . Shortness of Breath   Brief narrative: Robert [name] is a 69 y.o. male with PMH of insulin-dependent diabetes mitis, HTN, CAD/MI s/p multiple interventions, chronic combined systolic and diastolic CHF, CKD 3 COPD, peripheral neuropathy, rheumatoid arthritis. Patient presented to the ED on 06/03/2020 with a week history of weight gain, shortness of breath.  He was in respiratory failure.  Chest x-ray showed pulmonary edema. He was admitted for acute exacerbation of CHF. 9/9, echocardiogram showed new systolic diastolic cardiomyopathy with an EF of 35 to 40%.   9/13, repeat echo showed circumferential pericardial effusion and signs of tamponade.  Underwent pericardiocentesis by CT surgery. Patient was continued on maximal dose of IV diuretics with tapering urine output.  His oxygen requirement worsened.  Creatinine worsened.  9/17, Chest x-ray showed large right-sided pleural effusion as well as pulmonary vascular congestion.  Critical care and nephrology consultation were obtained.  HD catheter was placed.  Patient was started on CVVHD.  Subjective: Patient was seen and examined this morning. Sitting up at the edge of the bed.  Remains on 4 L oxygen by nasal cannula. Patient mentions that he has been on primidone for several years for tremors.  It is currently on hold and patient is getting tremors back.  He wants it resumed.  It seems that he is already on primidone since admission 550 mL of urine output in last 24 hours.  Assessment/Plan: Acute hypoxemic respiratory failure -Multifactorial: CHF, pleural effusion, pericardial effusion -Improving oxygen requirement.  Currently on 4 L oxygen by nasal cannula.  Continue to monitor.  Acute exacerbation chronic combined systolic and diastolic CHF Essential hypertension -EF 35 to  40% -Currently on Lopressor, Lasix and midodrine.  He has been on Lasix drip since yesterday.  Urine output 550 mL in last 24 hours. Cardiology and nephrology following.  -Continue to monitor urine output, daily weight, blood pressure, renal function electrolytes.  AKI on CKD stage IIIb -CRRT done from 9/17-9/22 with significant volume improvement but since it was stopped, patient has been gaining weight and creatinine worsening despite high-dose of IV Lasix. -9/24, patient had hemodialysis done but was only able to remove 1.3 L.  He has been on Lasix drip since yesterday.  Continue to monitor urine output and creatinine.  Creatinine seems worse today. Recent Labs    05/31/20 0401 05/31/20 1528 06/01/20 0322 06/01/20 1600 06/02/20 0338 06/02/20 1600 06/03/20 0508 06/04/20 0343 06/05/20 0500 06/06/20 0410  BUN 29* 24* 19 24* 22 28* 41* 67* 49* 68*  CREATININE 1.28* 1.26* 1.32* 1.76* 1.70* 2.08* 2.46* 2.88* 2.40* 2.93*   Bilateral pleural effusions -Improved pleural effusion on chest x-ray repeated on 9/20.  Pericardial effusion w/ tamponade  - status post pericardiocentesis on 9/13  A. fib -Continue amiodarone and Lopressor per cardiology. -Heparin drip was stopped because of drop in platelets. -Currently not on anticoagulation.  Hyperkalemia/hyperphosphatemia -Related to renal failure. Lokelma per nephrology Recent Labs  Lab 05/31/20 0401 05/31/20 1528 06/01/20 0322 06/01/20 1600 06/02/20 0338 06/02/20 0338 06/02/20 1600 06/03/20 0508 06/04/20 0343 06/05/20 0500 06/06/20 0410  K 4.3   < > 4.5   < > 5.3*   < > 5.5* 5.3* 5.4* 4.8 5.1  MG 2.6*  --  2.6*  --  2.6*  --   --  2.4  --   --   --  PHOS 2.4*   < > 2.7   < > 2.9   < > 3.4 4.9* 5.6* 4.9* 5.1*   < > = values in this interval not displayed.   Hyponatremia -Sodium level is fluctuating.  Dropped down from 130 yesterday to 124 today. Recent Labs  Lab 05/31/20 1528 06/01/20 0322 06/01/20 1600 06/01/20 2256  06/02/20 0338 06/02/20 1600 06/03/20 0508 06/04/20 0343 06/05/20 0500 06/06/20 0410  NA 134* 135 131* 137 134* 131* 131* 122* 130* 124*   Diabetes mellitus 2 with peripheral neuropathy -A1c 7.3 on 9/9 -Diabetes care coordinator consult appreciated.  -Blood sugar still running high over 200 fasting.  Insulin dose being adjusted. -Increase Levemir to 35 units twice daily, continue scheduled NovoLog Premeal to 8 units 3 times daily and sliding-scale insulin.  Continue Accu-Cheks -Continue Neurontin 300 mg 3 times daily Recent Labs  Lab 06/05/20 1607 06/05/20 1807 06/05/20 2003 06/05/20 2114 06/06/20 0623  GLUCAP 414* 484* 398* 371* 259*   Thrombocytopenia -Platelet level running low but fluctuating.  PF4 antibody sent. No immediate indication of argatroban. Recent Labs  Lab 05/31/20 0401 06/01/20 0322 06/02/20 0338 06/03/20 0508 06/03/20 1555 06/04/20 0343 06/05/20 0500 06/06/20 0410  PLT 125* 106* 75* 56* 55* 68* 62* 80*   Elevated liver enzymes -Improving.  Negative acute hepatitis panel. -Repeat liver function panel tomorrow. 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  Pressure injury of skin  - Per documentation of flow sheets  Muscular deconditioning - PT/OT consults  Mobility: PT OT eval. encourage ambulation.   Code  Status:   Code Status: Full Code  Nutritional status: Body mass index is 32.68 kg/m. Nutrition Problem: Increased nutrient needs Etiology: acute illness (AKI on CRRT) Signs/Symptoms: estimated needs Diet Order            Diet Carb Modified Fluid consistency: Thin; Room service appropriate? Yes; Fluid restriction: 1200 mL Fluid  Diet effective now                 DVT prophylaxis: Place and maintain sequential compression device Start: 06/03/20 0900 Place TED hose Start: 05/31/20 0933 SCD's Start: 05/20/2020 1942   Antimicrobials:  None Fluid: None Consultants: Nephrology, cardiology, critical care Family Communication:  None at bedside  Status is: Inpatient  Remains inpatient appropriate because:Hemodynamically unstable, Ongoing diagnostic testing needed not appropriate for outpatient work up and IV treatments appropriate due to intensity of illness or inability to take PO   Dispo: The patient is from: Home  Anticipated d/c is to: SNF most likely              Anticipated d/c date is: > 3 days              Patient currently is not medically stable to d/c.  Infusions:  . sodium chloride    . sodium chloride    . furosemide (LASIX) infusion 20 mg/hr (06/06/20 0400)  . milrinone 0.25 mcg/kg/min (06/06/20 0403)    Scheduled Meds: . amiodarone  200 mg Oral BID  . B-complex with vitamin C  1 tablet Oral Daily  . Chlorhexidine Gluconate Cloth  6 each Topical Daily  . Chlorhexidine Gluconate Cloth  6 each Topical Q0600  . dextromethorphan-guaiFENesin  1 tablet Oral BID  . feeding supplement (ENSURE ENLIVE)  237 mL Oral BID BM  . fluticasone  1 spray Each Nare Daily  . folic acid  1 mg Oral Daily  . gabapentin  300 mg Oral TID  . influenza vaccine adjuvanted  0.5 mL Intramuscular Tomorrow-1000  . insulin aspart  0-20 Units Subcutaneous TID WC  . insulin aspart  8 Units Subcutaneous TID WC  . insulin detemir  35 Units Subcutaneous BID  . ipratropium-albuterol  3  mL Nebulization BID  . melatonin  3 mg Oral QHS  . metoprolol tartrate  25 mg Oral BID  . midodrine  10 mg Oral TID WC  . mometasone-formoterol  2 puff Inhalation BID  . primidone  100 mg Oral QHS  . sodium chloride flush  10-40 mL Intracatheter Q12H  . sodium chloride flush  3 mL Intravenous Q12H  . sodium chloride flush  3 mL Intravenous Q12H  . tamsulosin  0.4 mg Oral Daily    Antimicrobials: Anti-infectives (From admission, onward)   None      PRN meds: sodium chloride, sodium chloride, acetaminophen, albuterol, heparin, loperamide, Muscle Rub, ondansetron (ZOFRAN) IV, oxyCODONE, sodium chloride flush, sodium chloride flush, sodium chloride flush   Objective: Vitals:   06/06/20 0740 06/06/20 0905  BP: 102/70   Pulse: 86   Resp: 17   Temp: 97.8 F (36.6 C)   SpO2: 91% 93%    Intake/Output Summary (Last 24 hours) at 06/06/2020 0919 Last data filed at 06/06/2020 0400 Gross per 24 hour  Intake 899.42 ml  Output 550 ml  Net 349.42 ml   Filed Weights   06/04/20 1221 06/05/20 0654 06/06/20 0341  Weight: 105.5 kg 104.6 kg 109.3 kg   Weight change: 3.8 kg Body mass index is 32.68 kg/m.   Physical Exam: General exam: Appears calm and comfortable. Not in physical distress Skin: No rashes, lesions or ulcers. HEENT: Atraumatic, normocephalic, supple neck, no obvious bleeding Lungs: Diminished air entry in bases. Remains on supplemental oxygen. CVS: Regular rate and rhythm, no murmur GI/Abd soft, nontender, nondistended, bowel sound present CNS: Alert, awake, oriented to place and person Psychiatry: Mood appropriate Extremities: 2+ pedal edema bilaterally  Data Review: I have personally reviewed the laboratory data and studies available.  Recent Labs  Lab 06/02/20 0338 06/02/20 0338 06/03/20 0508 06/03/20 1555 06/04/20 0343 06/05/20 0500 06/06/20 0410  WBC 17.3*  --  11.4*  --  14.6* 8.7 12.0*  HGB 13.1  --  11.5*  --  11.7* 10.6* 10.3*  HCT 41.6  --  36.7*   --  36.4* 33.5* 31.7*  MCV 96.3  --  98.1  --  95.0 95.4 95.8  PLT 75*   < > 56* 55* 68* 62* 80*   < > =  values in this interval not displayed.   Recent Labs  Lab 05/31/20 0401 05/31/20 1528 06/01/20 0322 06/01/20 1600 06/02/20 0338 06/02/20 0338 06/02/20 1600 06/03/20 0508 06/04/20 0343 06/05/20 0500 06/06/20 0410  NA 135   < > 135   < > 134*   < > 131* 131* 122* 130* 124*  K 4.3   < > 4.5   < > 5.3*   < > 5.5* 5.3* 5.4* 4.8 5.1  CL 102   < > 101   < > 103   < > 100 100 91* 96* 91*  CO2 25   < > 25   < > 24   < > 25 23 21* 25 22  GLUCOSE 128*   < > 167*   < > 185*   < > 267* 173* 362* 218* 291*  BUN 29*   < > 19   < > 22   < > 28* 41* 67* 49* 68*  CREATININE 1.28*   < > 1.32*   < > 1.70*   < > 2.08* 2.46* 2.88* 2.40* 2.93*  CALCIUM 8.5*   < > 8.6*   < > 8.0*   < > 8.4* 8.8* 8.5* 8.5* 8.5*  MG 2.6*  --  2.6*  --  2.6*  --   --  2.4  --   --   --   PHOS 2.4*   < > 2.7   < > 2.9   < > 3.4 4.9* 5.6* 4.9* 5.1*   < > = values in this interval not displayed.   F/u labs ordered  Signed, Terrilee Croak, MD [facility] 06/06/2020

## 2020-06-06 NOTE — Plan of Care (Signed)

## 2020-06-06 NOTE — Consult Note (Signed)
[place] Nurse Consult Note: Reason for Consult:This patient seen by Vascular surgery on 9/17 for this wound on the anterior left LE and another area on the medial aspect of the LE.  Nursing has been using a silicone foam dressing as topical management. WOC is asked to weigh in to see if there might be something more effective that will support and not detract from the Vascular POC. Wound type: Perfusion related, suspected mixed etiology in the presence of other comorbid conditions Pressure Injury POA: N/A Measurement: To be obtained by bedside RN today with first dressing change and added to the Nursing flow sheet Wound EHM:CNOB red, wet Drainage (amount, consistency, odor) Serous weeping Periwound:Ruddy red, discolored in deeper hues of red sometimes consistent with hemosiderin staining, but with more red tones than typically present. Dressing procedure/placement/frequency: There is a photo of this wound embedded within the note of Dr. Donzetta Matters (Vascular Surgery) one week ago on 05/28/20 along with a more medial presentation of a deeply hued and elevated area and it is noted again that the Vascular service is following.  We are asked for any suggestions to make a more effective topical dressing to add to the POC. Today I will add a folded layer of antimicrobial nonadherent (xeroform) gauze as a wound contact layer-the bismuth in this dressing has a drying effect and can often but not always assist in excess moisture levels/management of weeping dermatitis. Rather than a silicone foam to secure the dressing, I will suggest that Nursing top the xeroform with an ABD pad and secure with a few turns of Kerlix roll gauze/paper tape. I have provided guidance for daily dressings, but added a PRN indication for interim changes if drainage strike-through onto the exterior of the dressing occurs.  [place] nursing team will not follow, but will remain available to this patient, the nursing and medical teams.  Please re-consult  if needed. Thanks, Maudie Flakes, MSN, RN, [place], Arther Abbott  Pager# [phone]

## 2020-06-06 NOTE — Progress Notes (Addendum)
Patient ID: Robert [name], male   DOB: [birth year], 69 y.o.   MRN: [id number] S: No new complaints.  UOP of 550 overnight. O:BP 102/70 (BP Location: Right Arm)   Pulse 86   Temp 97.8 F (36.6 C) (Oral)   Resp 17   Ht 6' (1.829 m)   Wt 109.3 kg   SpO2 93%   BMI 32.68 kg/m   Intake/Output Summary (Last 24 hours) at 06/06/2020 1003 Last data filed at 06/06/2020 0400 Gross per 24 hour  Intake 899.42 ml  Output 550 ml  Net 349.42 ml   Intake/Output: I/O last 3 completed shifts: In: 1139.4 [P.O.:720; I.V.:357.4; IV Piggyback:62] Out: 550 [Urine:550]  Intake/Output this shift:  No intake/output data recorded. Weight change: 3.8 kg Gen: NAD, sitting upright in bed CVS: RRR Resp: decreased BS at bases Abd: distended +BS Ext: 2+ edema on RLE, 1+ on LLE  Recent Labs  Lab 06/01/20 1600 06/01/20 1600 06/01/20 2256 06/02/20 0338 06/02/20 1600 06/03/20 0508 06/04/20 0343 06/04/20 1259 06/05/20 0500 06/06/20 0410  NA 131*   < > 137 134* 131* 131* 122*  --  130* 124*  K 4.8   < > 4.5 5.3* 5.5* 5.3* 5.4*  --  4.8 5.1  CL 98  --   --  103 100 100 91*  --  96* 91*  CO2 25  --   --  24 25 23  21*  --  25 22  GLUCOSE 335*  --   --  185* 267* 173* 362*  --  218* 291*  BUN 24*  --   --  22 28* 41* 67*  --  49* 68*  CREATININE 1.76*  --   --  1.70* 2.08* 2.46* 2.88*  --  2.40* 2.93*  ALBUMIN 2.8*   < >  --  3.0* 2.9* 2.6* 2.6* 2.5* 2.7* 2.5*  CALCIUM 8.1*  --   --  8.0* 8.4* 8.8* 8.5*  --  8.5* 8.5*  PHOS 2.9  --   --  2.9 3.4 4.9* 5.6*  --  4.9* 5.1*  AST  --   --   --   --   --   --   --  31  --   --   ALT  --   --   --   --   --   --   --  71*  --   --    < > = values in this interval not displayed.   Liver Function Tests: Recent Labs  Lab 06/04/20 1259 06/05/20 0500 06/06/20 0410  AST 31  --   --   ALT 71*  --   --   ALKPHOS 135*  --   --   BILITOT 0.5  --   --   PROT 5.2*  --   --   ALBUMIN 2.5* 2.7* 2.5*   No results for input(s): LIPASE, AMYLASE in the last 168  hours. No results for input(s): AMMONIA in the last 168 hours. CBC: Recent Labs  Lab 06/02/20 0338 06/02/20 0338 06/03/20 0508 06/03/20 1555 06/04/20 0343 06/05/20 0500 06/06/20 0410  WBC 17.3*   < > 11.4*  --  14.6* 8.7 12.0*  HGB 13.1   < > 11.5*  --  11.7* 10.6* 10.3*  HCT 41.6   < > 36.7*  --  36.4* 33.5* 31.7*  MCV 96.3  --  98.1  --  95.0 95.4 95.8  PLT 75*   < > 56*   < >  68* 62* 80*   < > = values in this interval not displayed.   Cardiac Enzymes: No results for input(s): CKTOTAL, CKMB, CKMBINDEX, TROPONINI in the last 168 hours. CBG: Recent Labs  Lab 06/05/20 1607 06/05/20 1807 06/05/20 2003 06/05/20 2114 06/06/20 0623  GLUCAP 414* 484* 398* 371* 259*    Iron Studies: No results for input(s): IRON, TIBC, TRANSFERRIN, FERRITIN in the last 72 hours. Studies/Results: No results found. [name] amiodarone  200 mg Oral BID  . B-complex with vitamin C  1 tablet Oral Daily  . Chlorhexidine Gluconate Cloth  6 each Topical Daily  . Chlorhexidine Gluconate Cloth  6 each Topical Q0600  . dextromethorphan-guaiFENesin  1 tablet Oral BID  . feeding supplement (ENSURE ENLIVE)  237 mL Oral BID BM  . fluticasone  1 spray Each Nare Daily  . folic acid  1 mg Oral Daily  . gabapentin  300 mg Oral TID  . influenza vaccine adjuvanted  0.5 mL Intramuscular Tomorrow-1000  . insulin aspart  0-20 Units Subcutaneous TID WC  . insulin aspart  8 Units Subcutaneous TID WC  . insulin detemir  35 Units Subcutaneous BID  . ipratropium-albuterol  3 mL Nebulization BID  . melatonin  3 mg Oral QHS  . metoprolol tartrate  25 mg Oral BID  . midodrine  10 mg Oral TID WC  . mometasone-formoterol  2 puff Inhalation BID  . primidone  100 mg Oral QHS  . sodium chloride flush  10-40 mL Intracatheter Q12H  . sodium chloride flush  3 mL Intravenous Q12H  . sodium chloride flush  3 mL Intravenous Q12H  . tamsulosin  0.4 mg Oral Daily    BMET    Component Value Date/Time   NA 124 (L) 06/06/2020 0410    NA 135 10/17/2019 1628   K 5.1 06/06/2020 0410   CL 91 (L) 06/06/2020 0410   CO2 22 06/06/2020 0410   GLUCOSE 291 (H) 06/06/2020 0410   BUN 68 (H) 06/06/2020 0410   BUN 34 (H) 10/17/2019 1628   CREATININE 2.93 (H) 06/06/2020 0410   CALCIUM 8.5 (L) 06/06/2020 0410   GFRNONAA 21 (L) 06/06/2020 0410   GFRAA 24 (L) 06/06/2020 0410   CBC    Component Value Date/Time   WBC 12.0 (H) 06/06/2020 0410   RBC 3.31 (L) 06/06/2020 0410   HGB 10.3 (L) 06/06/2020 0410   HGB 16.3 09/25/2017 1135   HCT 31.7 (L) 06/06/2020 0410   HCT 47.1 09/25/2017 1135   PLT 80 (L) 06/06/2020 0410   PLT 159 09/25/2017 1135   MCV 95.8 06/06/2020 0410   MCV 91 09/25/2017 1135   MCH 31.1 06/06/2020 0410   MCHC 32.5 06/06/2020 0410   RDW 14.6 06/06/2020 0410   RDW 14.5 09/25/2017 1135   LYMPHSABS 0.6 (L) 05/20/2020 0629   MONOABS 1.1 (H) 05/20/2020 0629   EOSABS 0.0 05/20/2020 0629   BASOSABS 0.0 05/20/2020 0629   Assessment/Plan:  1. AKI/CKD stage 3b- in setting of acute decompensated CHF with pericardial effusion. Sub-optimal response to IV diuretics and started on CRRT 05/28/20 for better UF. No significant UOP since starting CRRT. Had been producing over 2.5 liters with IV diuretics.  1. Significant improvement of volume with CRRTwhich was stopped 9/22/21but has since been gaining weight with poor response to IV lasix. 2. Only able to UF 1.3 L of fluid with HD on 06/04/20 3. Discussed case with Dr. Haroldine Laws and agree that he would benefit from resuming CRRT with UF as  his weight has steadily increased since stopping and bp too low for significant UF with IHD. 4. Continue midodrine 10 mg tid, IV albumin with HD and linear sodium. 5. Continueto follow UOP and Scr 6. Stop lasix 7. Will likely need TDC this week if no significant improvement of UOP/renal function. 2. Urinary retention- cont with flomaxand intermittent straight caths 3. Hyperkalemia - will recheck later today 4. Acute on chronic  systolic CHF- cardiology following. Holding off on IV contrast due to AKI/CKD 1. Marked improvement with CRRT and is net negative 25 liters since admission 2. StoppedCRRT on 06/02/20 butnot responding to IV lasix. Plan for IHD and UF as bp tolerates but may need to resume CRRT. 3. Cardiology following and await input. 5. Pericardial effusion s/p pericardiocentesis with improvement 6. Acute hypoxic respiratory failure due to CHF. Improved with volume removal 7. Hyponatremia- due to CHF,now worsening will plan on trial of IHD. 8. COPD 9. DM type 2 per primary 10. A fib- currently rate controlled and on anticoagulation. Per cardiology. 11. Disposition- given biventricular failure, hypotension, and CAD, not sure he will do well with IHD.  Recommend Palliative care consult to help set goals/limits of care.  Donetta Potts, MD [employer] [phone]

## 2020-06-06 NOTE — Progress Notes (Signed)
Dr  Pietro Cassis notified of blood sugar of 416.  No new orders

## 2020-06-06 NOTE — Consult Note (Addendum)
Advanced Heart Failure Team Consult Note   Primary Physician: Redmond School, MD PCP-Cardiologist:  Sanda Klein, MD  Reason for Consultation: Advanced Heart Failure  HPI:    Robert [name] is seen today for evaluation of advanced HF at the request of Dr. Bonne Dolores  Mr. Hoeger is a 69 y.o. 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  He has been followed closely by Dr. Sallyanne Kuster and has struggled with recurrent ISR of LCX stent.  Most recent cath 05/2017 with NSTEMI acute late thrombosis of the left circumflex stent, treated with angioplasty alone (Dr. Tamala Julian) Known chronic total occlusion of the proximal right coronary. The distal right coronary fills by collaterals from the LAD  The LAD is widely patent with 60% segmental proximal to mid stenosis. The first diagonal contains 60% stenosis.  Echo 05/2019 with EF 50-55%, moderate basal septal hypertrophy.  Normal RV, mld to mod MR, LA mildly dilated. Hx mild carotid disease and PAD with Rt ABI 1.21 and left 0.61.   Admitted on 05/13/2020 with ADHF and marked volume overload. Echo 05/20/20 EF down to 35% RV normal. Moderate to large pericardial effusion without frank tamponade. On admit Cr 1.77 (baseline 1.3-1.5). Did not respond to IV lasix well initially.   On 05/20/2020 underwent RHC and pericardiocentesis with 1L bloody fluid (cytology negative for malignancy)  RHC AFTER pericardiocentesis RA 12 RV 47/11 PA 46/14 PCWP 16 CO 4.5 L/min PVR 3.6  Also seen by [facility] for leg wounds and felt to have severe PAD.   Post cath u/o picked up. On 9/15 developed AF with RVR.  O2 requirement climbed to 12L. Developed AKI with creatinine up to 2.2 on 05/27/20. CVVHD started on 05/28/20 for ongoing volume overload and worsening respiratory status. CVVHD stopped 06/02/20 and volume back up. Weight down to 213 on 9/22 (peak was 263 on 9/15). Weight back up yesterday to 230 and recorded as  240 today. Switched to [facility] on 06/04/20 but only able to UF 1.3L Renal started lasix gtt today at 20. Plan iHD tomorrow but may have to switch back to CVVHD   Repeat echo 05/31/20 EF 35-40% no effusion. Started on milrinone yesterday by Dr. Recardo Evangelist for co-ox 53%. Co-ox 65%  Urine output remains very sluggish. Sitting up on side of bed. Very SOB. + orthopnea.     Review of Systems: [y] = yes, [ ]  = no   . General: Weight gain Blue.Reese ]; Weight loss [ ] ; Anorexia [ ] ; Fatigue Blue.Reese ]; Fever [ ] ; Chills [ ] ; Weakness Blue.Reese ]  . Cardiac: Chest pain/pressure [ ] ; Resting SOB Blue.Reese ]; Exertional SOB [ y]; Orthopnea [ ] ; Pedal Edema Blue.Reese ]; Palpitations [ ] ; Syncope [ ] ; Presyncope [ ] ; Paroxysmal nocturnal dyspnea[ ]   . Pulmonary: Cough [ y]; Wheezing[ ] ; Hemoptysis[ ] ; Sputum [ ] ; Snoring [ ]   . GI: Vomiting[ ] ; Dysphagia[ ] ; Melena[ ] ; Hematochezia [ ] ; Heartburn[ ] ; Abdominal pain [ ] ; Constipation [ ] ; Diarrhea [ ] ; BRBPR [ ]   . GU: Hematuria[ ] ; Dysuria [ ] ; Nocturia[ ]   . Vascular: Pain in legs with walking [ ] ; Pain in feet with lying flat [ ] ; Non-healing sores [ y]; Stroke [ ] ; TIA [ ] ; Slurred speech [ ] ;  . Neuro: Headaches[ ] ; Vertigo[ ] ; Seizures[ ] ; Paresthesias[ ] ;Blurred vision [ ] ; Diplopia [ ] ; Vision changes [ ]   . Ortho/Skin: Arthritis Blue.Reese ]; Joint  pain Blue.Reese ]; Muscle pain [ y]; Joint swelling [ ] ; Back Pain [ ] ; Rash [ ]   . Psych: Depression[y ]; Anxiety[ ]   . Heme: Bleeding problems [ ] ; Clotting disorders [ ] ; Anemia [ ]   . Endocrine: Diabetes Blue.Reese ]; Thyroid dysfunction[ ]   Past Medical History: Past Medical History:  Diagnosis Date  . Anxiety   . CHF (congestive heart failure) ([place])   . Collagen vascular disease ([place])   . COPD (chronic obstructive pulmonary disease) ([place])   . Coronary artery disease   . COVID-19   . Full dentures   . GERD (gastroesophageal reflux disease)    Rolaids as needed  . High cholesterol   . History of MI (myocardial infarction)   . Hypertension    med.  dosage increased 04/2016; has been on BP med. x 10 yrs.  . Incarcerated umbilical hernia 15/1761  . Inguinal hernia 05/2016   bilateral   . Insulin dependent diabetes mellitus   . Ischemic cardiomyopathy    a. 03/2015 EF 40-45% by LV gram.  . Left leg cellulitis 10/08/2016  . PAF (paroxysmal atrial fibrillation) ([facility])    on home amio+metoprolol, plavix  . Rheumatoid arthritis(714.0)     Past Surgical History: Past Surgical History:  Procedure Laterality Date  . CARDIAC CATHETERIZATION N/A 02/01/2015   Procedure: Left Heart Cath and Coronary Angiography;  Surgeon: Belva Crome, MD;  Location: [place] [facility];  Service: Cardiovascular;  Laterality: N/A;  . CARDIAC CATHETERIZATION N/A 03/22/2015   Procedure: Left Heart Cath and Coronary Angiography;  Surgeon: Leonie Man, MD;  Location: [place] [facility];  Service: Cardiovascular;  Laterality: N/A;  . CARDIAC CATHETERIZATION  09/05/2002; 03/09/2003; 04/10/2005;06/02/2005; 05/09/2006; 02/09/2007; 03/01/2007  . COLONOSCOPY  2008   Dr. Gala Romney: diverticulosis, hyperplastic polyp.  . CORONARY ANGIOPLASTY  11/14/2012; 02/01/2015  . CORONARY ANGIOPLASTY WITH STENT PLACEMENT  05/16/2012   "1; makes total ~ 4"  . CORONARY STENT INTERVENTION N/A 06/06/2017   Procedure: CORONARY STENT INTERVENTION;  Surgeon: Belva Crome, MD;  Location: [place] [facility];  Service: Cardiovascular;  Laterality: N/A;  . INSERTION OF MESH Bilateral 05/24/2016   Procedure: INSERTION OF MESH;  Surgeon: Coralie Keens, MD;  Location: [place];  Service: General;  Laterality: Bilateral;  . LAPAROSCOPIC CHOLECYSTECTOMY    . LAPAROSCOPIC INGUINAL HERNIA WITH UMBILICAL HERNIA Bilateral 05/24/2016   Procedure: BILATERAL LAPAROSCOPIC INGUINAL HERNIA REPAIR WITH MESH AND UMBILICAL HERNIA REPAIR WITH MESH;  Surgeon: Coralie Keens, MD;  Location: [place];  Service: General;  Laterality: Bilateral;  . LEFT HEART CATH AND CORONARY ANGIOGRAPHY N/A  06/06/2017   Procedure: LEFT HEART CATH AND CORONARY ANGIOGRAPHY;  Surgeon: Belva Crome, MD;  Location: [place] [facility];  Service: Cardiovascular;  Laterality: N/A;  . LEFT HEART CATHETERIZATION WITH CORONARY ANGIOGRAM N/A 12/22/2011   Procedure: LEFT HEART CATHETERIZATION WITH CORONARY ANGIOGRAM;  Surgeon: Troy Sine, MD;  Location: [facility] CATH LAB;  Service: Cardiovascular;  Laterality: N/A;  . LEFT HEART CATHETERIZATION WITH CORONARY ANGIOGRAM Bilateral 05/16/2012   Procedure: LEFT HEART CATHETERIZATION WITH CORONARY ANGIOGRAM;  Surgeon: Lorretta Harp, MD;  Location: [facility] C[facility] LAB;  Service: Cardiovascular;  Laterality: Bilateral;  . LEFT HEART CATHETERIZATION WITH CORONARY ANGIOGRAM N/A 11/14/2012   Procedure: LEFT HEART CATHETERIZATION WITH CORONARY ANGIOGRAM;  Surgeon: Troy Sine, MD;  Location: [facility] CATH LAB;  Service: Cardiovascular;  Laterality: N/A;  . LEFT HEART CATHETERIZATION WITH CORONARY ANGIOGRAM N/A 09/01/2013   Procedure:  LEFT HEART CATHETERIZATION WITH CORONARY ANGIOGRAM;  Surgeon: Leonie Man, MD;  Location: [facility] CATH LAB;  Service: Cardiovascular;  Laterality: N/A;  . [place]; 1973; 1985   x 3  . NM MYOCAR PERF WALL MOTION  08/30/2009   No significant ischemia  . OPEN REDUCTION INTERNAL FIXATION (ORIF) DISTAL RADIAL FRACTURE Right 12/10/2016   Procedure: OPEN REDUCTION INTERNAL FIXATION (ORIF) DISTAL RADIAL FRACTURE;  Surgeon: Iran Planas, MD;  Location: [place];  Service: Orthopedics;  Laterality: Right;  . PERCUTANEOUS CORONARY INTERVENTION-BALLOON ONLY  12/22/2011   Procedure: PERCUTANEOUS CORONARY INTERVENTION-BALLOON ONLY;  Surgeon: Troy Sine, MD;  Location: [facility] CATH LAB;  Service: Cardiovascular;;  . PERCUTANEOUS CORONARY INTERVENTION-BALLOON ONLY  11/14/2012   Procedure: PERCUTANEOUS CORONARY INTERVENTION-BALLOON ONLY;  Surgeon: Troy Sine, MD;  Location: [facility] CATH LAB;  Service: Cardiovascular;;  . PERCUTANEOUS CORONARY STENT INTERVENTION (PCI-S)   05/16/2012   Procedure: PERCUTANEOUS CORONARY STENT INTERVENTION (PCI-S);  Surgeon: Lorretta Harp, MD;  Location: [facility] CATH LAB;  Service: Cardiovascular;;  . PERCUTANEOUS CORONARY STENT INTERVENTION (PCI-S)  09/01/2013   Procedure: PERCUTANEOUS CORONARY STENT INTERVENTION (PCI-S);  Surgeon: Leonie Man, MD;  Location: [facility] CATH LAB;  Service: Cardiovascular;;  . PERICARDIOCENTESIS N/A 05/19/2020   Procedure: PERICARDIOCENTESIS;  Surgeon: Belva Crome, MD;  Location: [place] [facility];  Service: Cardiovascular;  Laterality: N/A;  . RIGHT HEART CATH N/A 05/23/2020   Procedure: RIGHT HEART CATH;  Surgeon: Belva Crome, MD;  Location: [place] [facility];  Service: Cardiovascular;  Laterality: N/A;    Family History: Family History  Problem Relation Age of Onset  . Heart disease Father   . Colon cancer Brother        early 80s  . Hypertension Mother   . Heart disease Mother   . Colon cancer Mother        died age 56. late 69s early 11s at diagnosis.   . Lung cancer Mother     Social History: Social History   Socioeconomic History  . Marital status: Widowed    Spouse name: Not on file  . Number of children: Not on file  . Years of education: Not on file  . Highest education level: Not on file  Occupational History  . Occupation: Clinical biochemist  Tobacco Use  . Smoking status: Current Some Day Smoker    Packs/day: 1.00    Types: Cigarettes    Last attempt to quit: 06/11/2015    Years since quitting: 4.9  . Smokeless tobacco: Never Used  . Tobacco comment: pt states smoking less than half a pack  Vaping Use  . Vaping Use: Never used  Substance and Sexual Activity  . Alcohol use: No  . Drug use: No  . Sexual activity: Yes  Other Topics Concern  . Not on file  Social History Narrative  . Not on file   Social Determinants of Health   Financial Resource Strain:   . Difficulty of Paying Living Expenses: Not on file  Food Insecurity: No Food Insecurity  .  Worried About Charity fundraiser in the Last Year: Never true  . Ran Out of Food in the Last Year: Never true  Transportation Needs: No Transportation Needs  . Lack of Transportation (Medical): No  . Lack of Transportation (Non-Medical): No  Physical Activity:   . Days of Exercise per Week: Not on file  . Minutes of Exercise per Session: Not on file  Stress: No Stress Concern  Present  . Feeling of Stress : Not at all  Social Connections:   . Frequency of Communication with Friends and Family: Not on file  . Frequency of Social Gatherings with Friends and Family: Not on file  . Attends Religious Services: Not on file  . Active Member of Clubs or Organizations: Not on file  . Attends Archivist Meetings: Not on file  . Marital Status: Not on file    Allergies:  Allergies  Allergen Reactions  . Ivp Dye [Iodinated Diagnostic Agents] Itching and Rash    . amiodarone  200 mg Oral BID  . B-complex with vitamin C  1 tablet Oral Daily  . Chlorhexidine Gluconate Cloth  6 each Topical Daily  . Chlorhexidine Gluconate Cloth  6 each Topical Q0600  . dextromethorphan-guaiFENesin  1 tablet Oral BID  . feeding supplement (ENSURE ENLIVE)  237 mL Oral BID BM  . fluticasone  1 spray Each Nare Daily  . folic acid  1 mg Oral Daily  . gabapentin  300 mg Oral TID  . influenza vaccine adjuvanted  0.5 mL Intramuscular Tomorrow-1000  . insulin aspart  0-20 Units Subcutaneous TID WC  . insulin aspart  8 Units Subcutaneous TID WC  . insulin detemir  35 Units Subcutaneous BID  . ipratropium-albuterol  3 mL Nebulization BID  . melatonin  3 mg Oral QHS  . metoprolol tartrate  25 mg Oral BID  . midodrine  10 mg Oral TID WC  . mometasone-formoterol  2 puff Inhalation BID  . primidone  100 mg Oral QHS  . sodium chloride flush  10-40 mL Intracatheter Q12H  . sodium chloride flush  3 mL Intravenous Q12H  . sodium chloride flush  3 mL Intravenous Q12H  . tamsulosin  0.4 mg Oral Daily    .  sodium chloride    . sodium chloride    . furosemide (LASIX) infusion 20 mg/hr (06/06/20 0400)  . milrinone 0.25 mcg/kg/min (06/06/20 0403)     Objective:    Vital Signs:   Temp:  [97.5 F (36.4 C)-98.6 F (37 C)] 97.8 F (36.6 C) (09/26 0740) Pulse Rate:  [79-104] 86 (09/26 0740) Resp:  [14-20] 17 (09/26 0740) BP: (90-117)/(59-81) 102/70 (09/26 0740) SpO2:  [90 %-93 %] 93 % (09/26 0905) Weight:  [109.3 kg] 109.3 kg (09/26 0341) Last BM Date: 06/04/20  Weight change: Filed Weights   06/04/20 1221 06/05/20 0654 06/06/20 0341  Weight: 105.5 kg 104.6 kg 109.3 kg    Intake/Output:   Intake/Output Summary (Last 24 hours) at 06/06/2020 1149 Last data filed at 06/06/2020 0400 Gross per 24 hour  Intake 899.42 ml  Output 550 ml  Net 349.42 ml      Physical Exam    General:  Weak appearing. Sitting up on side of bed SOB HEENT: normal + large beard Neck: supple. JVP to ear . Carotids 2+ bilat; no bruits. No lymphadenopathy or thyromegaly appreciated. Cor: PMI nondisplaced. Irregular rate & rhythm. 2/^ TR Lungs: + crackles. Dull at basese Abdomen: soft, nontender, ++ distended. No hepatosplenomegaly. No bruits or masses. Good bowel sounds. Extremities: no cyanosis, clubbing, rash, 3+ edema + wound Neuro: alert & orientedx3, cranial nerves grossly intact. moves all 4 extremities w/o difficulty. Affect pleasant   Telemetry   AF 80-90s Personally reviewed  Labs   Basic Metabolic Panel: Recent Labs  Lab 05/31/20 0401 05/31/20 1528 06/01/20 0322 06/01/20 1600 06/02/20 6144 06/02/20 0338 06/02/20 1600 06/02/20 1600 06/03/20 3154 06/03/20 0086 06/04/20 7619  06/05/20 0500 06/06/20 0410  NA 135   < > 135   < > 134*   < > 131*  --  131*  --  122* 130* 124*  K 4.3   < > 4.5   < > 5.3*   < > 5.5*  --  5.3*  --  5.4* 4.8 5.1  CL 102   < > 101   < > 103   < > 100  --  100  --  91* 96* 91*  CO2 25   < > 25   < > 24   < > 25  --  23  --  21* 25 22  GLUCOSE 128*   < >  167*   < > 185*   < > 267*  --  173*  --  362* 218* 291*  BUN 29*   < > 19   < > 22   < > 28*  --  41*  --  67* 49* 68*  CREATININE 1.28*   < > 1.32*   < > 1.70*   < > 2.08*  --  2.46*  --  2.88* 2.40* 2.93*  CALCIUM 8.5*   < > 8.6*   < > 8.0*   < > 8.4*   < > 8.8*   < > 8.5* 8.5* 8.5*  MG 2.6*  --  2.6*  --  2.6*  --   --   --  2.4  --   --   --   --   PHOS 2.4*   < > 2.7   < > 2.9   < > 3.4  --  4.9*  --  5.6* 4.9* 5.1*   < > = values in this interval not displayed.    Liver Function Tests: Recent Labs  Lab 06/03/20 0508 06/04/20 0343 06/04/20 1259 06/05/20 0500 06/06/20 0410  AST  --   --  31  --   --   ALT  --   --  71*  --   --   ALKPHOS  --   --  135*  --   --   BILITOT  --   --  0.5  --   --   PROT  --   --  5.2*  --   --   ALBUMIN 2.6* 2.6* 2.5* 2.7* 2.5*   No results for input(s): LIPASE, AMYLASE in the last 168 hours. No results for input(s): AMMONIA in the last 168 hours.  CBC: Recent Labs  Lab 06/02/20 0338 06/02/20 0338 06/03/20 0508 06/03/20 1555 06/04/20 0343 06/05/20 0500 06/06/20 0410  WBC 17.3*  --  11.4*  --  14.6* 8.7 12.0*  HGB 13.1  --  11.5*  --  11.7* 10.6* 10.3*  HCT 41.6  --  36.7*  --  36.4* 33.5* 31.7*  MCV 96.3  --  98.1  --  95.0 95.4 95.8  PLT 75*   < > 56* 55* 68* 62* 80*   < > = values in this interval not displayed.    Cardiac Enzymes: No results for input(s): CKTOTAL, CKMB, CKMBINDEX, TROPONINI in the last 168 hours.  BNP: BNP (last 3 results) Recent Labs    07/03/19 2313 05/16/20 1228 06/07/2020 1310  BNP 346.0* 149.0* 157.0*    ProBNP (last 3 results) No results for input(s): PROBNP in the last 8760 hours.   CBG: Recent Labs  Lab 06/05/20 1807 06/05/20 2003 06/05/20 2114 06/06/20 0623 06/06/20 1148  GLUCAP 484*  398* 371* 259* 416*    Coagulation Studies: Recent Labs    06/03/20 1555  LABPROT 15.3*  INR 1.3*     Imaging    No results found.   Medications:     Current Medications: . amiodarone   200 mg Oral BID  . B-complex with vitamin C  1 tablet Oral Daily  . Chlorhexidine Gluconate Cloth  6 each Topical Daily  . Chlorhexidine Gluconate Cloth  6 each Topical Q0600  . dextromethorphan-guaiFENesin  1 tablet Oral BID  . feeding supplement (ENSURE ENLIVE)  237 mL Oral BID BM  . fluticasone  1 spray Each Nare Daily  . folic acid  1 mg Oral Daily  . gabapentin  300 mg Oral TID  . influenza vaccine adjuvanted  0.5 mL Intramuscular Tomorrow-1000  . insulin aspart  0-20 Units Subcutaneous TID WC  . insulin aspart  8 Units Subcutaneous TID WC  . insulin detemir  35 Units Subcutaneous BID  . ipratropium-albuterol  3 mL Nebulization BID  . melatonin  3 mg Oral QHS  . metoprolol tartrate  25 mg Oral BID  . midodrine  10 mg Oral TID WC  . mometasone-formoterol  2 puff Inhalation BID  . primidone  100 mg Oral QHS  . sodium chloride flush  10-40 mL Intracatheter Q12H  . sodium chloride flush  3 mL Intravenous Q12H  . sodium chloride flush  3 mL Intravenous Q12H  . tamsulosin  0.4 mg Oral Daily     Infusions: . sodium chloride    . sodium chloride    . furosemide (LASIX) infusion 20 mg/hr (06/06/20 0400)  . milrinone 0.25 mcg/kg/min (06/06/20 0403)      Assessment/Plan   1. Acute on chronic systolic HF due to iCM - Echo 05/2019 with EF 50-55%, - Echo 9/21 EF 35-40% with moderate to large pericardial effusion - Massively volume overloaded this admit and required CVVHD - Milrinone started on 9/25 for co-ox 53% Now 63% - Volume status improved with CVVHD (263 -> 213) now  trending back up quickly on iHD (wt 230-> 240) - No ACE/ARB, spiro due to low BP and AKI - on low-dose b-blocker for AF. With low co-ox will stop and assess AF rate  2. Pericardial effusion. hemorrhagic - s/p tap this admit with 1L bloody fluid - cytology negative - echo 05/31/20: no recurrence - unclear etiology. will need non-contrast chest CT at some point to exclude malignancy   3. AKI - likely  cardiorenal/ATN - CVVHD 9/17-9/22  Weight 263-> 213 (after CVVHD stopped weight 230 -> 240) - iHD 9/24 with only 1.3L off - lasix gtt started 9/26 but u/o remains sluggish  4. Acute on chronic hypoxic respiratory failure - predominantly due to volume overload but also has underlying COPD - need to resume CVVHD  5. CAD - stable no s/s angina currently - continue ASA/statin   6. P AFL - now on po amio. Rate controlled - not candidate for DC-CV currently as AC off due to hemorrhagic pericardial effusion   7. LE wounds  - venous stasis with PAD as well   Very complicated case. I worry prognosis will not be good. Currently markedly volume overloaded with worsening respiratory distress. Did not tolerate iHD well. I have d/w Dr. Marval Regal. Will move back to CCU for CVVHD. If kidneys don't recover significantly we may be headed toward palliative situation. Ok to continue milrinone for now. Can add NE as needed to support BP.  CRITICAL CARE Performed  by: Glori Bickers  Total critical care time: 60 minutes  Critical care time was exclusive of separately billable procedures and treating other patients.  Critical care was necessary to treat or prevent imminent or life-threatening deterioration.  Critical care was time spent personally by me (independent of midlevel providers or residents) on the following activities: development of treatment plan with patient and/or surrogate as well as nursing, discussions with consultants, evaluation of patient's response to treatment, examination of patient, obtaining history from patient or surrogate, ordering and performing treatments and interventions, ordering and review of laboratory studies, ordering and review of radiographic studies, pulse oximetry and re-evaluation of patient's condition.     Length of Stay: [place], MD  06/06/2020, 11:49 AM  Advanced Heart Failure Team Pager [phone] (M-F; 7a - 4p)  Please contact [facility] for night-coverage after hours (4p -7a ) and weekends on [email or web address]

## 2020-06-07 LAB — RENAL FUNCTION PANEL
Albumin: 2.5 g/dL — ABNORMAL LOW (ref 3.5–5.0)
Albumin: 2.5 g/dL — ABNORMAL LOW (ref 3.5–5.0)
Anion gap: 9 (ref 5–15)
Anion gap: 9 (ref 5–15)
BUN: 51 mg/dL — ABNORMAL HIGH (ref 8–23)
BUN: 40 mg/dL — ABNORMAL HIGH (ref 8–23)
CO2: 23 mmol/L (ref 22–32)
CO2: 24 mmol/L (ref 22–32)
Calcium: 8.1 mg/dL — ABNORMAL LOW (ref 8.9–10.3)
Calcium: 7.9 mg/dL — ABNORMAL LOW (ref 8.9–10.3)
Chloride: 96 mmol/L — ABNORMAL LOW (ref 98–111)
Chloride: 97 mmol/L — ABNORMAL LOW (ref 98–111)
Creatinine, Ser: 2.31 mg/dL — ABNORMAL HIGH (ref 0.61–1.24)
Creatinine, Ser: 1.92 mg/dL — ABNORMAL HIGH (ref 0.61–1.24)
GFR calc Af Amer: 33 mL/min — ABNORMAL LOW (ref >60)
GFR calc Af Amer: 41 mL/min — ABNORMAL LOW (ref >60)
GFR calc non Af Amer: 28 mL/min — ABNORMAL LOW (ref >60)
GFR calc non Af Amer: 35 mL/min — ABNORMAL LOW (ref >60)
Glucose, Bld: 155 mg/dL — ABNORMAL HIGH (ref 70–99)
Glucose, Bld: 221 mg/dL — ABNORMAL HIGH (ref 70–99)
Phosphorus: 3.8 mg/dL (ref 2.5–4.6)
Phosphorus: 3 mg/dL (ref 2.5–4.6)
Potassium: 5 mmol/L (ref 3.5–5.1)
Potassium: 4.8 mmol/L (ref 3.5–5.1)
Sodium: 128 mmol/L — ABNORMAL LOW (ref 135–145)
Sodium: 130 mmol/L — ABNORMAL LOW (ref 135–145)

## 2020-06-07 LAB — HEPATIC FUNCTION PANEL
ALT: 110 U/L — ABNORMAL HIGH (ref 0–44)
AST: 70 U/L — ABNORMAL HIGH (ref 15–41)
Albumin: 2.6 g/dL — ABNORMAL LOW (ref 3.5–5.0)
Alkaline Phosphatase: 144 U/L — ABNORMAL HIGH (ref 38–126)
Bilirubin, Direct: 0.1 mg/dL (ref 0.0–0.2)
Indirect Bilirubin: 0.5 mg/dL (ref 0.3–0.9)
Total Bilirubin: 0.6 mg/dL (ref 0.3–1.2)
Total Protein: 5.7 g/dL — ABNORMAL LOW (ref 6.5–8.1)

## 2020-06-07 LAB — CBC WITH DIFFERENTIAL/PLATELET
Abs Immature Granulocytes: 0.14 K/uL — ABNORMAL HIGH (ref 0.00–0.07)
Basophils Absolute: 0 K/uL (ref 0.0–0.1)
Basophils Relative: 0 %
Eosinophils Absolute: 0.2 K/uL (ref 0.0–0.5)
Eosinophils Relative: 1 %
HCT: 32.5 % — ABNORMAL LOW (ref 39.0–52.0)
Hemoglobin: 10.5 g/dL — ABNORMAL LOW (ref 13.0–17.0)
Immature Granulocytes: 1 %
Lymphocytes Relative: 3 %
Lymphs Abs: 0.5 K/uL — ABNORMAL LOW (ref 0.7–4.0)
MCH: 30.5 pg (ref 26.0–34.0)
MCHC: 32.3 g/dL (ref 30.0–36.0)
MCV: 94.5 fL (ref 80.0–100.0)
Monocytes Absolute: 1.4 K/uL — ABNORMAL HIGH (ref 0.1–1.0)
Monocytes Relative: 9 %
Neutro Abs: 14.5 K/uL — ABNORMAL HIGH (ref 1.7–7.7)
Neutrophils Relative %: 86 %
Platelets: 118 K/uL — ABNORMAL LOW (ref 150–400)
RBC: 3.44 MIL/uL — ABNORMAL LOW (ref 4.22–5.81)
RDW: 14.8 % (ref 11.5–15.5)
WBC: 16.8 K/uL — ABNORMAL HIGH (ref 4.0–10.5)
nRBC: 0 % (ref 0.0–0.2)

## 2020-06-07 LAB — POCT ACTIVATED CLOTTING TIME
Activated Clotting Time: 109 seconds
Activated Clotting Time: 120 seconds
Activated Clotting Time: 120 seconds
Activated Clotting Time: 131 seconds
Activated Clotting Time: 142 seconds
Activated Clotting Time: 142 seconds
Activated Clotting Time: 147 seconds
Activated Clotting Time: 153 seconds
Activated Clotting Time: 153 seconds
Activated Clotting Time: 153 seconds
Activated Clotting Time: 164 seconds
Activated Clotting Time: 175 seconds
Activated Clotting Time: 164 seconds
Activated Clotting Time: 169 seconds
Activated Clotting Time: 169 seconds
Activated Clotting Time: 175 seconds
Activated Clotting Time: 186 seconds
Activated Clotting Time: 186 seconds
Activated Clotting Time: 197 seconds
Activated Clotting Time: 202 s

## 2020-06-07 LAB — COOXEMETRY PANEL
Carboxyhemoglobin: 1.2 % (ref 0.5–1.5)
Methemoglobin: 0.8 % (ref 0.0–1.5)
O2 Saturation: 70.6 %
Total hemoglobin: 10.9 g/dL — ABNORMAL LOW (ref 12.0–16.0)

## 2020-06-07 LAB — GLUCOSE, CAPILLARY
Glucose-Capillary: 138 mg/dL — ABNORMAL HIGH (ref 70–99)
Glucose-Capillary: 292 mg/dL — ABNORMAL HIGH (ref 70–99)
Glucose-Capillary: 231 mg/dL — ABNORMAL HIGH (ref 70–99)
Glucose-Capillary: 96 mg/dL (ref 70–99)

## 2020-06-07 LAB — APTT: aPTT: 92 seconds — ABNORMAL HIGH (ref 24–36)

## 2020-06-07 LAB — MAGNESIUM: Magnesium: 2.1 mg/dL (ref 1.7–2.4)

## 2020-06-07 MED ORDER — HEPARIN SODIUM (PORCINE) 1000 UNIT/ML IJ SOLN
INTRAMUSCULAR | Status: AC
  Filled 2020-06-07: qty 4

## 2020-06-07 MED ORDER — ORAL CARE MOUTH RINSE
15.0000 mL | Freq: Two times a day (BID) | OROMUCOSAL | Status: DC
  Administered 2020-06-07 – 2020-06-18 (×14): 15 mL via OROMUCOSAL

## 2020-06-07 MED ORDER — ALBUMIN HUMAN 25 % IV SOLN
INTRAVENOUS | Status: AC
  Filled 2020-06-07: qty 100

## 2020-06-07 MED ORDER — INSULIN ASPART 100 UNIT/ML ~~LOC~~ SOLN
12.0000 [IU] | Freq: Three times a day (TID) | SUBCUTANEOUS | Status: DC
  Administered 2020-06-07 – 2020-06-15 (×21): 12 [IU] via SUBCUTANEOUS

## 2020-06-07 MED ORDER — SODIUM CHLORIDE 0.9 % IV SOLN
125.0000 mg | Freq: Every day | INTRAVENOUS | Status: AC
  Administered 2020-06-07 – 2020-06-12 (×6): 125 mg via INTRAVENOUS
  Filled 2020-06-07 (×6): qty 10

## 2020-06-07 NOTE — Progress Notes (Signed)
Patient ID: Robert [name], male   DOB: 1951-04-04, 69 y.o.   MRN: [id number] [facility] Progress Note   Assessment/ Plan:   1. Acute kidney Injury on chronic kidney disease stage IIIb: Secondary to acute exacerbation of congestive heart failure in the setting of pericardial effusion.  Failed IV diuresis with progressive worsening of respiratory failure for which she was switched back to CRRT.  Overnight has been net -1.9 L 2.  Acute exacerbation of systolic heart failure: Failed to respond to diuretics and currently ongoing extracorporeal fluid removal with CRRT/UF.  On midodrine 3 times a day. 3.  Pericardial effusion: Hemorrhagic and will require additional evaluation for malignancy when stable.  Initial cytology negative. 4.  Hyponatremia: Secondary to CHF exacerbation/acute kidney injury; monitor with CRRT/UF. 5.  Anemia: Secondary to chronic kidney disease and possibly exacerbated by acute illness/CHF decompensation.  Monitor for overt blood loss and will give intravenous iron/ESA.  Subjective:   Reports to be feeling somewhat better, breathing improving.   Objective:   BP 93/72   Pulse 100   Temp 97.7 F (36.5 C) (Oral)   Resp (!) 25   Ht 6' (1.829 m)   Wt 108.7 kg   SpO2 98%   BMI 32.50 kg/m   Intake/Output Summary (Last 24 hours) at 06/07/2020 1137 Last data filed at 06/07/2020 1100 Gross per 24 hour  Intake 858.4 ml  Output 2790 ml  Net -1931.6 ml   Weight change: -1.6 kg  Physical Exam: Gen: Comfortably sitting up in recliner, watching television CVS: Pulse regular tachycardia, distant S1-S2 Resp: Diminished breath sounds over bases, no distinct rales Abd: Soft, obese, nontender Ext: 3+ pitting lower extremity edema  Imaging: No results found.  Labs: BMET Recent Labs  Lab 06/02/20 1600 06/03/20 0508 06/04/20 0343 06/05/20 0500 06/06/20 0410 06/06/20 1620 06/07/20 0402  NA 131* 131* 122* 130* 124* 123* 128*  K 5.5* 5.3* 5.4* 4.8 5.1 5.2*  5.0  CL 100 100 91* 96* 91* 90* 96*  CO2 25 23 21* 25 22 22 23   GLUCOSE 267* 173* 362* 218* 291* 443* 155*  BUN 28* 41* 67* 49* 68* 71* 51*  CREATININE 2.08* 2.46* 2.88* 2.40* 2.93* 3.09* 2.31*  CALCIUM 8.4* 8.8* 8.5* 8.5* 8.5* 8.2* 8.1*  PHOS 3.4 4.9* 5.6* 4.9* 5.1* 5.9* 3.8   CBC Recent Labs  Lab 06/05/20 0500 06/06/20 0410 06/06/20 1620 06/07/20 0402  WBC 8.7 12.0* 11.3* 16.8*  NEUTROABS  --   --   --  14.5*  HGB 10.6* 10.3* 10.7* 10.5*  HCT 33.5* 31.7* 33.9* 32.5*  MCV 95.4 95.8 96.0 94.5  PLT 62* 80* 91* 118*    Medications:    . amiodarone  200 mg Oral BID  . B-complex with vitamin C  1 tablet Oral Daily  . Chlorhexidine Gluconate Cloth  6 each Topical Daily  . Chlorhexidine Gluconate Cloth  6 each Topical Q0600  . dextromethorphan-guaiFENesin  1 tablet Oral BID  . feeding supplement (ENSURE ENLIVE)  237 mL Oral BID BM  . fluticasone  1 spray Each Nare Daily  . folic acid  1 mg Oral Daily  . gabapentin  300 mg Oral BID  . influenza vaccine adjuvanted  0.5 mL Intramuscular Tomorrow-1000  . insulin aspart  0-20 Units Subcutaneous TID WC  . insulin aspart  12 Units Subcutaneous TID WC  . insulin detemir  35 Units Subcutaneous BID  . ipratropium-albuterol  3 mL Nebulization BID  . melatonin  3 mg Oral  QHS  . midodrine  10 mg Oral TID WC  . mometasone-formoterol  2 puff Inhalation BID  . primidone  100 mg Oral QHS  . sodium chloride flush  10-40 mL Intracatheter Q12H  . sodium chloride flush  3 mL Intravenous Q12H  . sodium chloride flush  3 mL Intravenous Q12H  . tamsulosin  0.4 mg Oral Daily      [name], MD 06/07/2020, 11:37 AM

## 2020-06-07 NOTE — Progress Notes (Signed)
OT Cancellation Note  Patient Details Name: Robert [name] MRN: [id number] DOB: April 20, 1951   Cancelled Treatment:    Reason Eval/Treat Not Completed: Fatigue/lethargy limiting ability to participate  [name] 06/07/2020, 2:33 PM  Nestor Lewandowsky, OTR/L Acute Rehabilitation Services Pager: [phone] Office: [phone]

## 2020-06-07 NOTE — Progress Notes (Signed)
Physical Therapy Treatment Patient Details Name: Robert [name] MRN: [id number] DOB: 07/11/1951 Today's Date: 06/07/2020    History of Present Illness 69 yo admitted with dyspnea, AFib with pericardial effusion with tamponade s/p pericardiocentesis 9/14. Course complicated by AKI on CKD with CRRT (9/17-9/22, restarted 9/26). PMhx: RA, COPD, CHF, CAD, covid, neuropathy, HTN, HLD    PT Comments    Pt agreeable to transfer OOB to chair and see how he feels. Pt back on CRRT and not overly excited about mobility and reports not feeling as well today. Pt on 3L and able to maintain sats on RA with transfer with return to 3L after seated in chair. Pt with education for HEP and continued benefit of mobility even with additional fluid and CRRT. Will continue to follow.   98 HR 97% on 3L 93/72 (80)    Follow Up Recommendations  Home health PT     Equipment Recommendations  None recommended by PT    Recommendations for Other Services       Precautions / Restrictions Precautions Precautions: Fall;Other (comment) Precaution Comments: CRRT    Mobility  Bed Mobility Overal bed mobility: Needs Assistance Bed Mobility: Supine to Sit     Supine to sit: Supervision;HOB elevated     General bed mobility comments: HOB 30 degrees with use of rail to pivot to side. Did not lower bed due to CRRT lines being finicky  Transfers Overall transfer level: Needs assistance   Transfers: Sit to/from Stand;Stand Pivot Transfers Sit to Stand: Min assist Stand pivot transfers: Min guard       General transfer comment: cues for hand placement with pt pulling up on stabilized RW today, good stability with pivot with decreased control of descent and cues for safety. Assist for line management  Ambulation/Gait             General Gait Details: pt declined even side stepping of standing marching   Stairs             Wheelchair Mobility    Modified Rankin (Stroke Patients Only)        Balance Overall balance assessment: Needs assistance   Sitting balance-Leahy Scale: Good       Standing balance-Leahy Scale: Fair Standing balance comment: bil UE support on RW                            Cognition Arousal/Alertness: Awake/alert Behavior During Therapy: WFL for tasks assessed/performed Overall Cognitive Status: Within Functional Limits for tasks assessed                                        Exercises General Exercises - Lower Extremity Long Arc Quad: AROM;Both;Seated;15 reps Hip ABduction/ADduction: AROM;Both;Seated;15 reps Hip Flexion/Marching: AROM;Both;Seated;15 reps    General Comments        Pertinent Vitals/Pain Pain Assessment: No/denies pain    Home Living                      Prior Function            PT Goals (current goals can now be found in the care plan section) Progress towards PT goals: Progressing toward goals (limited by medical complexity)    Frequency    Min 3X/week      PT Plan Current plan remains appropriate  Co-evaluation              AM-PAC PT "6 Clicks" Mobility   Outcome Measure  Help needed turning from your back to your side while in a flat bed without using bedrails?: A Little Help needed moving from lying on your back to sitting on the side of a flat bed without using bedrails?: A Little Help needed moving to and from a bed to a chair (including a wheelchair)?: A Little Help needed standing up from a chair using your arms (e.g., wheelchair or bedside chair)?: A Little Help needed to walk in hospital room?: A Little Help needed climbing 3-5 steps with a railing? : A Lot 6 Click Score: 17    End of Session   Activity Tolerance: Patient tolerated treatment well Patient left: in chair;with call bell/phone within reach;with nursing/sitter in room Nurse Communication: Mobility status PT Visit Diagnosis: Other abnormalities of gait and mobility  (R26.89);Difficulty in walking, not elsewhere classified (R26.2)     Time: 1287-8676 PT Time Calculation (min) (ACUTE ONLY): 19 min  Charges:  $Therapeutic Activity: 8-22 mins                     Deneka Greenwalt P, PT Acute Rehabilitation Services Pager: [phone] Office: [place] [facility] Janaya Broy 06/07/2020, 1:08 PM

## 2020-06-07 NOTE — Progress Notes (Signed)
PROGRESS NOTE  Robert [name]  DOB: 1951/08/26  PCP: Redmond School, MD DGL:[id number]  DOA: 05/30/2020  LOS: 19 days   Chief Complaint  Patient presents with  . Shortness of Breath   Brief narrative: Robert [name] is a 69 y.o. male with PMH of insulin-dependent diabetes mitis, HTN, CAD/MI s/p multiple interventions, chronic combined systolic and diastolic CHF, CKD 3 COPD, peripheral neuropathy, rheumatoid arthritis. Patient presented to the ED on 06/01/2020 with a week history of weight gain, shortness of breath.  He was in respiratory failure.  Chest x-ray showed pulmonary edema. He was admitted for acute exacerbation of CHF. 9/9, echocardiogram showed new systolic diastolic cardiomyopathy with an EF of 35 to 40%.   9/13, repeat echo showed circumferential pericardial effusion and signs of tamponade.  Underwent pericardiocentesis by CT surgery. Patient was continued on maximal dose of IV diuretics with tapering urine output.  His oxygen requirement worsened. Creatinine worsened.  9/17, Chest x-ray showed large right-sided pleural effusion as well as pulmonary vascular congestion.  Critical care and nephrology consultation were obtained.  HD catheter was placed.  Patient was started on CVVHD.  Subjective: Patient was seen and examined this morning. Propped up in bed.  Taking his breakfast.  Not in distress. Patient is back on CRRT since yesterday. Blood sugar level was over 400 yesterday.  Seems to be improving, fasting blood sugar 138 this morning. WBC count worsening.  Assessment/Plan: Acute hypoxemic respiratory failure -Multifactorial: CHF, pleural effusion, pericardial effusion -Continues to require 4 L oxygen by nasal cannula.  Acute exacerbation chronic combined systolic and diastolic CHF Essential hypertension -EF 35 to 40% -Patient remains massively volume overloaded.  Currently undergoing CRRT.  Heart failure team following.  Patient is on milrinone drip, Levophed  drip.  AKI on CKD stage IIIb -Likely cardiorenal.  Nephrology following.  Patient is on CRRT.   Recent Labs    06/01/20 0322 06/01/20 1600 06/02/20 0338 06/02/20 1600 06/03/20 0508 06/04/20 0343 06/05/20 0500 06/06/20 0410 06/06/20 1620 06/07/20 0402  BUN 19 24* 22 28* 41* 67* 49* 68* 71* 51*  CREATININE 1.32* 1.76* 1.70* 2.08* 2.46* 2.88* 2.40* 2.93* 3.09* 2.31*   Bilateral pleural effusions -Improved pleural effusion on chest x-ray repeated on 9/20.  Pericardial effusion w/ tamponade  -status post pericardiocentesis on 9/13  A. fib -On amiodarone.  Remains in A. fib.  Currently off anticoagulation because of hemorrhagic pericarditis. -Not a candidate for DCCV per cardiology.  Hyperkalemia/hyperphosphatemia -Related to renal failure.  Nephrology following Recent Labs  Lab 06/01/20 0322 06/01/20 1600 06/02/20 0338 06/02/20 1600 06/03/20 0508 06/03/20 0508 06/04/20 0343 06/05/20 0500 06/06/20 0410 06/06/20 1620 06/07/20 0402  K 4.5   < > 5.3*   < > 5.3*   < > 5.4* 4.8 5.1 5.2* 5.0  MG 2.6*  --  2.6*  --  2.4  --   --   --   --   --  2.1  PHOS 2.7   < > 2.9   < > 4.9*   < > 5.6* 4.9* 5.1* 5.9* 3.8   < > = values in this interval not displayed.   Hyponatremia -Sodium level is fluctuating.  128 today Recent Labs  Lab 06/01/20 1600 06/01/20 2256 06/02/20 0338 06/02/20 1600 06/03/20 0508 06/04/20 0343 06/05/20 0500 06/06/20 0410 06/06/20 1620 06/07/20 0402  NA 131* 137 134* 131* 131* 122* 130* 124* 123* 128*   Diabetes mellitus 2 with peripheral neuropathy -A1c 7.3 on 9/9 -Blood sugar level lower is  running more than 200 mostly.  It was up to 440 yesterday.  131 this morning. -Diabetes care coordinator consult appreciated.  -Continue Levemir to 35 units twice daily, increase scheduled NovoLog Premeal to 12 units 3 times daily and sliding-scale insulin.  Continue Accu-Cheks -Continue Neurontin 300 mg 3 times daily Recent Labs  Lab 06/06/20 1148  06/06/20 1543 06/06/20 1819 06/06/20 2205 06/07/20 0700  GLUCAP 416* 437* 434* 290* 138*   Thrombocytopenia -Platelet level running low but fluctuating.  PF4 antibody sent. No immediate indication of argatroban. -Platelet level is up to 118 today. Recent Labs  Lab 06/01/20 0322 06/02/20 0338 06/03/20 0508 06/03/20 1555 06/04/20 0343 06/05/20 0500 06/06/20 0410 06/06/20 1620 06/07/20 0402  PLT 106* 75* 56* 55* 68* 62* 80* 91* 118*   Elevated liver enzymes -Improving.  Negative acute hepatitis panel. -Repeat liver function panel today showed some worsening. 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  Pressure injury of skin  - Per documentation of flow sheets  Muscular deconditioning - PT/OT consults  Mobility: PT OT eval. encourage ambulation.   Code Status:   Code Status: Full Code  Nutritional status: Body mass index is 32.5 kg/m. Nutrition Problem: Increased nutrient needs Etiology: acute illness (AKI on CRRT) Signs/Symptoms: estimated needs Diet Order            Diet Carb Modified Fluid consistency: Thin; Room service appropriate? Yes; Fluid restriction: 1200 mL Fluid  Diet effective now                 DVT prophylaxis: heparin 10,000 units/ 20 mL infusion syringe Start: 06/06/20 1745 heparin bolus via infusion  syringe 1,000 Units Start: 06/06/20 1741 Place and maintain sequential compression device Start: 06/03/20 0900 Place TED hose Start: 05/31/20 0933 SCD's Start: 06/10/2020 1942   Antimicrobials:  None Fluid: None Consultants: Nephrology, cardiology Family Communication:  None at bedside  Status is: Inpatient  Remains inpatient appropriate because: Patient is on CRRT   Dispo: The patient is from: Home              Anticipated d/c is to: SNF most likely              Anticipated d/c date is: > 3 days              Patient currently is not medically stable to d/c.  Infusions:  .  prismasol BGK 4/2.5 500 mL/hr at 06/07/20 1017  .  prismasol BGK 4/2.5 300 mL/hr at 06/06/20 1507  . sodium chloride    . sodium chloride    . sodium chloride    .  sodium chloride    . heparin 10,000 units/ 20 mL infusion syringe 1,600 Units/hr (06/07/20 1126)  . milrinone 0.25 mcg/kg/min (06/07/20 1000)  . norepinephrine (LEVOPHED) Adult infusion 9 mcg/min (06/07/20 1000)  . prismasol BGK 4/2.5 1,500 mL/hr at 06/07/20 2563    Scheduled Meds: . amiodarone  200 mg Oral BID  . B-complex with vitamin C  1 tablet Oral Daily  . Chlorhexidine Gluconate Cloth  6 each Topical Daily  . Chlorhexidine Gluconate Cloth  6 each Topical Q0600  . dextromethorphan-guaiFENesin  1 tablet Oral BID  . feeding supplement (ENSURE ENLIVE)  237 mL Oral BID BM  . fluticasone  1 spray Each Nare Daily  . folic acid  1 mg Oral Daily  . gabapentin  300 mg Oral BID  . influenza vaccine adjuvanted  0.5 mL Intramuscular Tomorrow-1000  . insulin aspart  0-20 Units Subcutaneous TID WC  . insulin aspart  12 Units Subcutaneous TID WC  . insulin detemir  35 Units Subcutaneous BID  . ipratropium-albuterol  3 mL Nebulization BID  . melatonin  3 mg Oral QHS  . midodrine  10 mg Oral TID WC  . mometasone-formoterol  2 puff Inhalation BID  . primidone  100 mg Oral QHS  . sodium chloride flush  10-40 mL Intracatheter Q12H  . sodium chloride  flush  3 mL Intravenous Q12H  . sodium chloride flush  3 mL Intravenous Q12H  . tamsulosin  0.4 mg Oral Daily    Antimicrobials: Anti-infectives (From admission, onward)   None      PRN meds: sodium chloride, sodium chloride, sodium chloride, sodium chloride, acetaminophen, albuterol, alteplase, heparin, heparin, heparin, heparin, lidocaine (PF), lidocaine-prilocaine, loperamide, Muscle Rub, ondansetron (ZOFRAN) IV, oxyCODONE, pentafluoroprop-tetrafluoroeth, sodium chloride flush, sodium chloride flush, sodium chloride flush   Objective: Vitals:   06/07/20 1100 06/07/20 1136  BP: 93/72   Pulse: 100   Resp: (!) 25   Temp:  97.7 F (36.5 C)  SpO2: 98%     Intake/Output Summary (Last 24 hours) at 06/07/2020 1137 Last data filed at 06/07/2020 1100 Gross per 24 hour  Intake 858.4 ml  Output 2790 ml  Net -1931.6 ml   Filed Weights   06/06/20 0341 06/06/20 1700 06/07/20 0500  Weight: 109.3 kg 107.7 kg 108.7 kg   Weight change: -1.6 kg Body mass index is 32.5 kg/m.   Physical Exam: General exam: Appears calm and comfortable. Not in physical distress.  On CRRT Skin: No rashes, lesions or ulcers. HEENT: Atraumatic, normocephalic, supple neck, no obvious bleeding Lungs: Diminished air entry in bases. Remains on supplemental oxygen. CVS: Rate controlled A. fib  GI/Abd soft, nontender, nondistended, bowel sound present CNS: Alert, awake, oriented to place and person Psychiatry: Mood appropriate Extremities: 2+ pedal edema bilaterally  Data Review: I have personally reviewed the laboratory data and studies available.  Recent Labs  Lab 06/04/20 0343 06/05/20 0500 06/06/20 0410 06/06/20 1620 06/07/20 0402  WBC 14.6* 8.7 12.0* 11.3* 16.8*  NEUTROABS  --   --   --   --  14.5*  HGB 11.7* 10.6* 10.3* 10.7* 10.5*  HCT 36.4* 33.5* 31.7* 33.9* 32.5*  MCV 95.0 95.4 95.8 96.0 94.5  PLT 68* 62* 80* 91* 118*   Recent Labs  Lab 06/01/20 0322 06/01/20 1600 06/02/20 0338  06/02/20 1600 06/03/20 0508 06/03/20 0508 06/04/20 0343 06/05/20 0500 06/06/20 0410 06/06/20 1620 06/07/20 0402  NA 135   < > 134*   < > 131*   < > 122* 130*  124* 123* 128*  K 4.5   < > 5.3*   < > 5.3*   < > 5.4* 4.8 5.1 5.2* 5.0  CL 101   < > 103   < > 100   < > 91* 96* 91* 90* 96*  CO2 25   < > 24   < > 23   < > 21* 25 22 22 23   GLUCOSE 167*   < > 185*   < > 173*   < > 362* 218* 291* 443* 155*  BUN 19   < > 22   < > 41*   < > 67* 49* 68* 71* 51*  CREATININE 1.32*   < > 1.70*   < > 2.46*   < > 2.88* 2.40* 2.93* 3.09* 2.31*  CALCIUM 8.6*   < > 8.0*   < > 8.8*   < > 8.5* 8.5* 8.5* 8.2* 8.1*  MG 2.6*  --  2.6*  --  2.4  --   --   --   --   --  2.1  PHOS 2.7   < > 2.9   < > 4.9*   < > 5.6* 4.9* 5.1* 5.9* 3.8   < > = values in this interval not displayed.   F/u labs ordered  Signed, Terrilee Croak, MD [facility] 06/07/2020

## 2020-06-07 NOTE — Progress Notes (Addendum)
Advanced Heart Failure Rounding Note  PCP-Cardiologist: Sanda Klein, MD   Subjective:   Yesterday CVVHD restarted. Lasix drip stopped. Getting I &O cath. Had 600 cc of urine output.   Norepi increaesed from 3>9 mcg. On norepi 9 mcg + milrinone 0.25 mcg. CO-OX 71%.    WBC 11>16.   Complaining of weakness. Does not want to get out of bed. Denies SOB.    Objective:   Weight Range: 108.7 kg Body mass index is 32.5 kg/m.   Vital Signs:   Temp:  [93.2 F (34 C)-98.4 F (36.9 C)] 97.7 F (36.5 C) (09/27 0700) Pulse Rate:  [39-168] 113 (09/27 0715) Resp:  [11-30] 24 (09/27 0715) BP: (64-146)/(21-116) 107/66 (09/27 0715) SpO2:  [88 %-100 %] 98 % (09/27 0715) Weight:  [107.7 kg-108.7 kg] 108.7 kg (09/27 0500) Last BM Date: 06/06/20  Weight change: Filed Weights   06/06/20 0341 06/06/20 1700 06/07/20 0500  Weight: 109.3 kg 107.7 kg 108.7 kg    Intake/Output:   Intake/Output Summary (Last 24 hours) at 06/07/2020 0748 Last data filed at 06/07/2020 0700 Gross per 24 hour  Intake 689.57 ml  Output 2249 ml  Net -1559.43 ml      Physical Exam    General:  Appears weak.  No resp difficulty HEENT: Normal Neck: Supple. JVP 10-11  . Carotids 2+ bilat; no bruits. No lymphadenopathy or thyromegaly appreciated. LIJ  Cor: PMI nondisplaced. Irregular rate & rhythm. No rubs, gallops or murmurs. Lungs: Clear on 3 liters Hansen>  Abdomen: Soft, nontender, distended. No hepatosplenomegaly. No bruits or masses. Good bowel sounds. Extremities: No cyanosis, clubbing, rash, R and LLE 2-3+ edema Neuro: Alert & orientedx3, cranial nerves grossly intact. moves all 4 extremities w/o difficulty. Affect pleasant   Telemetry    A fib 100s   EKG   n/a  Labs    CBC Recent Labs    06/06/20 1620 06/07/20 0402  WBC 11.3* 16.8*  NEUTROABS  --  14.5*  HGB 10.7* 10.5*  HCT 33.9* 32.5*  MCV 96.0 94.5  PLT 91* 973*   Basic Metabolic Panel Recent Labs    06/06/20 1620  06/07/20 0402  NA 123* 128*  K 5.2* 5.0  CL 90* 96*  CO2 22 23  GLUCOSE 443* 155*  BUN 71* 51*  CREATININE 3.09* 2.31*  CALCIUM 8.2* 8.1*  MG  --  2.1  PHOS 5.9* 3.8   Liver Function Tests Recent Labs    06/04/20 1259 06/05/20 0500 06/06/20 1620 06/07/20 0402  AST 31  --   --  70*  ALT 71*  --   --  110*  ALKPHOS 135*  --   --  144*  BILITOT 0.5  --   --  0.6  PROT 5.2*  --   --  5.7*  ALBUMIN 2.5*   < > 2.5* 2.6*  2.5*   < > = values in this interval not displayed.   No results for input(s): LIPASE, AMYLASE in the last 72 hours. Cardiac Enzymes No results for input(s): CKTOTAL, CKMB, CKMBINDEX, TROPONINI in the last 72 hours.  BNP: BNP (last 3 results) Recent Labs    07/03/19 2313 05/16/20 1228 06/02/2020 1310  BNP 346.0* 149.0* 157.0*    ProBNP (last 3 results) No results for input(s): PROBNP in the last 8760 hours.   D-Dimer No results for input(s): DDIMER in the last 72 hours. Hemoglobin A1C No results for input(s): HGBA1C in the last 72 hours. Fasting Lipid Panel No results  for input(s): CHOL, HDL, LDLCALC, TRIG, CHOLHDL, LDLDIRECT in the last 72 hours. Thyroid Function Tests No results for input(s): TSH, T4TOTAL, T3FREE, THYROIDAB in the last 72 hours.  Invalid input(s): FREET3  Other results:   Imaging     No results found.   Medications:     Scheduled Medications: . amiodarone  200 mg Oral BID  . B-complex with vitamin C  1 tablet Oral Daily  . Chlorhexidine Gluconate Cloth  6 each Topical Daily  . Chlorhexidine Gluconate Cloth  6 each Topical Q0600  . dextromethorphan-guaiFENesin  1 tablet Oral BID  . feeding supplement (ENSURE ENLIVE)  237 mL Oral BID BM  . fluticasone  1 spray Each Nare Daily  . folic acid  1 mg Oral Daily  . gabapentin  300 mg Oral BID  . heparin sodium (porcine)      . influenza vaccine adjuvanted  0.5 mL Intramuscular Tomorrow-1000  . insulin aspart  0-20 Units Subcutaneous TID WC  . insulin aspart  8  Units Subcutaneous TID WC  . insulin detemir  35 Units Subcutaneous BID  . ipratropium-albuterol  3 mL Nebulization BID  . melatonin  3 mg Oral QHS  . midodrine  10 mg Oral TID WC  . mometasone-formoterol  2 puff Inhalation BID  . primidone  100 mg Oral QHS  . sodium chloride flush  10-40 mL Intracatheter Q12H  . sodium chloride flush  3 mL Intravenous Q12H  . sodium chloride flush  3 mL Intravenous Q12H  . tamsulosin  0.4 mg Oral Daily     Infusions: .  prismasol BGK 4/2.5 500 mL/hr at 06/07/20 0430  .  prismasol BGK 4/2.5 300 mL/hr at 06/06/20 1507  . sodium chloride    . sodium chloride    . sodium chloride    . sodium chloride    . albumin human    . heparin 10,000 units/ 20 mL infusion syringe 1,100 Units/hr (06/07/20 0742)  . milrinone 0.25 mcg/kg/min (06/07/20 0700)  . norepinephrine (LEVOPHED) Adult infusion 9 mcg/min (06/07/20 0700)  . prismasol BGK 4/2.5 1,500 mL/hr at 06/07/20 0650     PRN Medications:  sodium chloride, sodium chloride, sodium chloride, sodium chloride, acetaminophen, albuterol, alteplase, heparin, heparin, heparin, heparin, lidocaine (PF), lidocaine-prilocaine, loperamide, Muscle Rub, ondansetron (ZOFRAN) IV, oxyCODONE, pentafluoroprop-tetrafluoroeth, sodium chloride flush, sodium chloride flush, sodium chloride flush    Patient Profile   Mr. 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 Assessment/Plan  1. Acute on chronic systolic HF due to iCM - Echo 05/2019 with EF 50-55%, - Echo 9/21 EF 35-40% with moderate to large pericardial effusion - Massively volume overloaded this admit and required CVVHD - Milrinone started on 9/25 for co-ox 53% Now 63% - Volume status improved with CVVHD (263 -> 213) now  trending back up quickly on iHD (wt 230-> 240) - CVP 10 but with marked extremity edema. Repeat ECHO. RV normal on 9/921  - No ACE/ARB, spiro due to low BP  and AKI - Off bb with low CO-OX   2. Pericardial effusion. hemorrhagic - s/p tap this admit with 1L bloody fluid - cytology negative - echo 05/31/20: no recurrence - unclear etiology. will need non-contrast chest CT at some point to exclude malignancy   3. AKI - likely cardiorenal/ATN - CVVHD 9/17-9/22  Weight 263-> 213 (after CVVHD stopped weight 230 -> 240) - iHD 9/24 with only 1.3L off -  Restarted CVVHD 9/26  4. Acute on chronic hypoxic respiratory failure - predominantly due to volume overload but also has underlying COPD - need to resume CVVHD - Oxygen weaned to 3 liters.   5. CAD - No chest pain.  - continue ASA/statin   6. P AFL/Fib  - now on po amio.  - Remains in Afib.  - not candidate for DC-CV currently as AC off due to hemorrhagic pericardial effusion   7. LE wounds  - venous stasis with PAD as well   8. ID  WBC trending up. 11>16  - Hypotensive overnight. Started on norepi.  - Check blood and urine cultures.     Length of Stay: [place], NP  06/07/2020, 7:48 AM  Advanced Heart Failure Team Pager [phone] (M-F; 7a - 4p)  Please contact [facility] for night-coverage after hours (4p -7a ) and weekends on [email or web address]   Agree with above.   Back on CVVHC. BP low. NE started overnight. Co-ox ok. BP soft. Feels weak and SOB  General:  Weak appearing. No resp difficulty HEENT: normal Neck: supple. JVP to jaw  Carotids 2+ bilat; no bruits. No lymphadenopathy or thryomegaly appreciated. Cor: PMI nondisplaced. Irregular rate & rhythm. 2/6 TR Lungs: decreased at basese Abdomen: soft, nontender, nondistended. No hepatosplenomegaly. No bruits or masses. Good bowel sounds. Extremities: no cyanosis, clubbing, rash, 3+ edema + wound Neuro: alert & orientedx3, cranial nerves grossly intact. moves all 4 extremities w/o difficulty. Affect pleasant  He remains very tenuous. Massively volume overloaded. Now back on CVVHD with NE for support. Continue  with volume removal.WBC up. Recheck cultures.   Prognosis guarded. Has not tolerated iHD so will need some renal recovery to survive this.   CRITICAL CARE Performed by: Glori Bickers  Total critical care time: 35 minutes  Critical care time was exclusive of separately billable procedures and treating other patients.  Critical care was necessary to treat or prevent imminent or life-threatening deterioration.  Critical care was time spent personally by me (independent of midlevel providers or residents) on the following activities: development of treatment plan with patient and/or surrogate as well as nursing, discussions with consultants, evaluation of patient's response to treatment, examination of patient, obtaining history from patient or surrogate, ordering and performing treatments and interventions, ordering and review of laboratory studies, ordering and review of radiographic studies, pulse oximetry and re-evaluation of patient's condition.  Glori Bickers, MD  9:10 AM

## 2020-06-07 NOTE — Progress Notes (Signed)
Inpatient Diabetes Program Recommendations  AACE/ADA: New Consensus Statement on Inpatient Glycemic Control (2015)  Target Ranges:  Prepandial:   less than 140 mg/dL      Peak postprandial:   less than 180 mg/dL (1-2 hours)      Critically ill patients:  140 - 180 mg/dL   Lab Results  Component Value Date   GLUCAP 138 (H) 06/07/2020   HGBA1C 7.3 (H) 05/20/2020    Review of Glycemic Control  Diabetes history: DM 2 Outpatient Diabetes medications:  Precose 100 mg tid with meals, Amaryl 2 mg with breakfast, Toujeo 25 units daily Current orders for Inpatient glycemic control:  Novolog resistant tid with meals Novolog 8 units tid with meals Lantus 35 units bid  Inpatient Diabetes Program Recommendations:   Noted postprandial CBGs continue to be elevated. Consider: -Increase Novolog meal coverage to 12 units tid if eats 50% Secure chat sent Dr. Pietro Cassis.  Thank you, Robert [name]. Robert Rabalais, RN, MSN, CDE  Diabetes Coordinator Inpatient Glycemic Control Team Team Pager [phone] (8am-5pm) 06/07/2020 10:15 AM

## 2020-06-08 ENCOUNTER — Other Ambulatory Visit: Payer: Self-pay

## 2020-06-08 DIAGNOSIS — I5023 Acute on chronic systolic (congestive) heart failure: Secondary | ICD-10-CM

## 2020-06-08 LAB — RENAL FUNCTION PANEL
Albumin: 2.5 g/dL — ABNORMAL LOW (ref 3.5–5.0)
Albumin: 2.6 g/dL — ABNORMAL LOW (ref 3.5–5.0)
Anion gap: 8 (ref 5–15)
Anion gap: 8 (ref 5–15)
BUN: 30 mg/dL — ABNORMAL HIGH (ref 8–23)
BUN: 29 mg/dL — ABNORMAL HIGH (ref 8–23)
CO2: 25 mmol/L (ref 22–32)
CO2: 26 mmol/L (ref 22–32)
Calcium: 8.2 mg/dL — ABNORMAL LOW (ref 8.9–10.3)
Calcium: 8.5 mg/dL — ABNORMAL LOW (ref 8.9–10.3)
Chloride: 98 mmol/L (ref 98–111)
Chloride: 100 mmol/L (ref 98–111)
Creatinine, Ser: 1.67 mg/dL — ABNORMAL HIGH (ref 0.61–1.24)
Creatinine, Ser: 1.6 mg/dL — ABNORMAL HIGH (ref 0.61–1.24)
GFR calc Af Amer: 48 mL/min — ABNORMAL LOW (ref >60)
GFR calc Af Amer: 51 mL/min — ABNORMAL LOW (ref >60)
GFR calc non Af Amer: 42 mL/min — ABNORMAL LOW (ref >60)
GFR calc non Af Amer: 44 mL/min — ABNORMAL LOW (ref >60)
Glucose, Bld: 73 mg/dL (ref 70–99)
Glucose, Bld: 157 mg/dL — ABNORMAL HIGH (ref 70–99)
Phosphorus: 2.8 mg/dL (ref 2.5–4.6)
Phosphorus: 2.6 mg/dL (ref 2.5–4.6)
Potassium: 5 mmol/L (ref 3.5–5.1)
Potassium: 5.4 mmol/L — ABNORMAL HIGH (ref 3.5–5.1)
Sodium: 131 mmol/L — ABNORMAL LOW (ref 135–145)
Sodium: 134 mmol/L — ABNORMAL LOW (ref 135–145)

## 2020-06-08 LAB — POCT ACTIVATED CLOTTING TIME
Activated Clotting Time: 158 seconds
Activated Clotting Time: 186 seconds
Activated Clotting Time: 191 seconds
Activated Clotting Time: 191 seconds
Activated Clotting Time: 191 seconds
Activated Clotting Time: 191 seconds
Activated Clotting Time: 197 seconds
Activated Clotting Time: 197 seconds
Activated Clotting Time: 197 seconds
Activated Clotting Time: 208 seconds
Activated Clotting Time: 213 seconds

## 2020-06-08 LAB — GLUCOSE, CAPILLARY
Glucose-Capillary: 118 mg/dL — ABNORMAL HIGH (ref 70–99)
Glucose-Capillary: 135 mg/dL — ABNORMAL HIGH (ref 70–99)
Glucose-Capillary: 149 mg/dL — ABNORMAL HIGH (ref 70–99)
Glucose-Capillary: 162 mg/dL — ABNORMAL HIGH (ref 70–99)
Glucose-Capillary: 171 mg/dL — ABNORMAL HIGH (ref 70–99)
Glucose-Capillary: 227 mg/dL — ABNORMAL HIGH (ref 70–99)
Glucose-Capillary: 293 mg/dL — ABNORMAL HIGH (ref 70–99)
Glucose-Capillary: 70 mg/dL (ref 70–99)
Glucose-Capillary: 73 mg/dL (ref 70–99)
Glucose-Capillary: 80 mg/dL (ref 70–99)
Glucose-Capillary: 81 mg/dL (ref 70–99)

## 2020-06-08 LAB — CBC WITH DIFFERENTIAL/PLATELET
Abs Immature Granulocytes: 0.14 10*3/uL — ABNORMAL HIGH (ref 0.00–0.07)
Basophils Absolute: 0 10*3/uL (ref 0.0–0.1)
Basophils Relative: 0 %
Eosinophils Absolute: 0.2 10*3/uL (ref 0.0–0.5)
Eosinophils Relative: 2 %
HCT: 30.3 % — ABNORMAL LOW (ref 39.0–52.0)
Hemoglobin: 9.6 g/dL — ABNORMAL LOW (ref 13.0–17.0)
Immature Granulocytes: 1 %
Lymphocytes Relative: 5 %
Lymphs Abs: 0.7 10*3/uL (ref 0.7–4.0)
MCH: 30 pg (ref 26.0–34.0)
MCHC: 31.7 g/dL (ref 30.0–36.0)
MCV: 94.7 fL (ref 80.0–100.0)
Monocytes Absolute: 1.4 10*3/uL — ABNORMAL HIGH (ref 0.1–1.0)
Monocytes Relative: 10 %
Neutro Abs: 11.8 10*3/uL — ABNORMAL HIGH (ref 1.7–7.7)
Neutrophils Relative %: 82 %
Platelets: 110 10*3/uL — ABNORMAL LOW (ref 150–400)
RBC: 3.2 MIL/uL — ABNORMAL LOW (ref 4.22–5.81)
RDW: 14.9 % (ref 11.5–15.5)
WBC: 14.3 10*3/uL — ABNORMAL HIGH (ref 4.0–10.5)
nRBC: 0 % (ref 0.0–0.2)

## 2020-06-08 LAB — COOXEMETRY PANEL
Carboxyhemoglobin: 1.1 % (ref 0.5–1.5)
Methemoglobin: 0.7 % (ref 0.0–1.5)
O2 Saturation: 69.3 %
Total hemoglobin: 14.4 g/dL (ref 12.0–16.0)

## 2020-06-08 LAB — URINE CULTURE: Culture: NO GROWTH

## 2020-06-08 LAB — APTT: aPTT: 137 seconds — ABNORMAL HIGH (ref 24–36)

## 2020-06-08 LAB — MAGNESIUM: Magnesium: 2.4 mg/dL (ref 1.7–2.4)

## 2020-06-08 MED ORDER — SODIUM ZIRCONIUM CYCLOSILICATE 10 G PO PACK
10.0000 g | PACK | Freq: Once | ORAL | Status: AC
  Administered 2020-06-08: 10 g via ORAL
  Filled 2020-06-08: qty 1

## 2020-06-08 MED ORDER — INSULIN DETEMIR 100 UNIT/ML ~~LOC~~ SOLN
30.0000 [IU] | Freq: Two times a day (BID) | SUBCUTANEOUS | Status: DC
  Administered 2020-06-08 – 2020-06-15 (×16): 30 [IU] via SUBCUTANEOUS
  Filled 2020-06-08 (×18): qty 0.3

## 2020-06-08 NOTE — Patient Outreach (Signed)
[facility]) Care Management  06/08/2020  Robert [name] [birth year] [id number]   Patient with prolonged hospital admission. Patient disposition unknown at this time due to being medically unstable for discharge.   Plan: RN CM will close case at this time.  Jone Baseman, RN, MSN [facility] Care Management Coordinator Direct Line [phone] Cell [phone] Toll Free: [phone]  Fax: [phone]

## 2020-06-08 NOTE — Progress Notes (Signed)
Advanced Heart Failure Rounding Note  PCP-Cardiologist: Sanda Klein, MD   Subjective:    CVVHD restarted on 9/26. Pulling -150/hr. Lasix off/ U/o 300cc x 24 hr.   Weight not charted.   On norepi 6 mcg + milrinone 0.25 mcg. CO-OX 69%.    WBC 11>16>14.   Breathing better today. Denies orthopnea or PND. No CP. Feels weak and bloated.   Objective:   Weight Range: 108.7 kg Body mass index is 32.5 kg/m.   Vital Signs:   Temp:  [97.7 F (36.5 C)-97.9 F (36.6 C)] 97.9 F (36.6 C) (09/28 0000) Pulse Rate:  [48-189] 111 (09/28 0500) Resp:  [16-30] 24 (09/28 0500) BP: (81-124)/(51-86) 107/60 (09/28 0500) SpO2:  [86 %-100 %] 93 % (09/28 0500) Last BM Date: 06/06/20  Weight change: Filed Weights   06/06/20 0341 06/06/20 1700 06/07/20 0500  Weight: 109.3 kg 107.7 kg 108.7 kg    Intake/Output:   Intake/Output Summary (Last 24 hours) at 06/08/2020 0629 Last data filed at 06/08/2020 0500 Gross per 24 hour  Intake 915.56 ml  Output 3930 ml  Net -3014.44 ml      Physical Exam    General:  Sitting in bed. Weak appearing. No resp difficulty HEENT: normal Neck: supple. JVP to jaw Carotids 2+ bilat; no bruits. No lymphadenopathy or thryomegaly appreciated. Cor: PMI nondisplaced. Irregular rate & rhythm. 2/6 TR. Lungs: clear Abdomen: soft, nontender, +nondistended. No hepatosplenomegaly. No bruits or masses. Good bowel sounds. Extremities: no cyanosis, clubbing, rash, 3+ edema + wounds Neuro: alert & orientedx3, cranial nerves grossly intact. moves all 4 extremities w/o difficulty. Affect pleasant    Telemetry    A fib 100-110s Personally reviewed  Labs    CBC Recent Labs    06/07/20 0402 06/08/20 0405  WBC 16.8* 14.3*  NEUTROABS 14.5* 11.8*  HGB 10.5* 9.6*  HCT 32.5* 30.3*  MCV 94.5 94.7  PLT 118* 580*   Basic Metabolic Panel Recent Labs    06/07/20 0402 06/07/20 0402 06/07/20 1630 06/08/20 0405  NA 128*   < > 130* 131*  K 5.0   < > 4.8 5.0   CL 96*   < > 97* 98  CO2 23   < > 24 25  GLUCOSE 155*   < > 221* 73  BUN 51*   < > 40* 30*  CREATININE 2.31*   < > 1.92* 1.67*  CALCIUM 8.1*   < > 7.9* 8.2*  MG 2.1  --   --  2.4  PHOS 3.8   < > 3.0 2.8   < > = values in this interval not displayed.   Liver Function Tests Recent Labs    06/07/20 0402 06/07/20 0402 06/07/20 1630 06/08/20 0405  AST 70*  --   --   --   ALT 110*  --   --   --   ALKPHOS 144*  --   --   --   BILITOT 0.6  --   --   --   PROT 5.7*  --   --   --   ALBUMIN 2.6*   2.5*   < > 2.5* 2.5*   < > = values in this interval not displayed.   No results for input(s): LIPASE, AMYLASE in the last 72 hours. Cardiac Enzymes No results for input(s): CKTOTAL, CKMB, CKMBINDEX, TROPONINI in the last 72 hours.  BNP: BNP (last 3 results) Recent Labs    07/03/19 2313 05/16/20 1228 05/27/2020 1310  BNP 346.0* 149.0*  157.0*    ProBNP (last 3 results) No results for input(s): PROBNP in the last 8760 hours.   D-Dimer No results for input(s): DDIMER in the last 72 hours. Hemoglobin A1C No results for input(s): HGBA1C in the last 72 hours. Fasting Lipid Panel No results for input(s): CHOL, HDL, LDLCALC, TRIG, CHOLHDL, LDLDIRECT in the last 72 hours. Thyroid Function Tests No results for input(s): TSH, T4TOTAL, T3FREE, THYROIDAB in the last 72 hours.  Invalid input(s): FREET3  Other results:   Imaging    No results found.   Medications:     Scheduled Medications:  amiodarone  200 mg Oral BID   B-complex with vitamin C  1 tablet Oral Daily   Chlorhexidine Gluconate Cloth  6 each Topical Daily   Chlorhexidine Gluconate Cloth  6 each Topical Q0600   dextromethorphan-guaiFENesin  1 tablet Oral BID   feeding supplement (ENSURE ENLIVE)  237 mL Oral BID BM   fluticasone  1 spray Each Nare Daily   folic acid  1 mg Oral Daily   gabapentin  300 mg Oral BID   influenza vaccine adjuvanted  0.5 mL Intramuscular Tomorrow-1000   insulin aspart  0-20  Units Subcutaneous TID WC   insulin aspart  12 Units Subcutaneous TID WC   insulin detemir  35 Units Subcutaneous BID   mouth rinse  15 mL Mouth Rinse BID   melatonin  3 mg Oral QHS   midodrine  10 mg Oral TID WC   mometasone-formoterol  2 puff Inhalation BID   primidone  100 mg Oral QHS   tamsulosin  0.4 mg Oral Daily    Infusions:   prismasol BGK 4/2.5 500 mL/hr at 06/08/20 0605    prismasol BGK 4/2.5 300 mL/hr at 06/08/20 0601   sodium chloride     sodium chloride     ferric gluconate (FERRLECIT/NULECIT) IV Stopped (06/07/20 1407)   heparin 10,000 units/ 20 mL infusion syringe 1,700 Units/hr (06/08/20 0558)   milrinone 0.25 mcg/kg/min (06/08/20 0500)   norepinephrine (LEVOPHED) Adult infusion 7 mcg/min (06/08/20 0500)   prismasol BGK 4/2.5 1,500 mL/hr at 06/08/20 0305    PRN Medications: sodium chloride, sodium chloride, acetaminophen, albuterol, alteplase, heparin, heparin, heparin, heparin, lidocaine (PF), lidocaine-prilocaine, loperamide, Muscle Rub, ondansetron (ZOFRAN) IV, oxyCODONE, pentafluoroprop-tetrafluoroeth, sodium chloride flush    Patient Profile   Robert [name] a 69 y.o.malewith a 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 Assessment/Plan   1. Acute on chronic systolic HF due to iCM - Echo 05/2019 with EF 50-55%, - Echo 9/21 EF 35-40% with moderate to large pericardial effusion - Massively volume overloaded this admit and required CVVHD - Milrinone started on 9/25 for co-ox 53%. NE added to support CVVHD. Co-ox now 69% - Volume status improved with CVVHD (263 -> 213) now  trending back up quickly on iHD (wt 230-> 240) - CVVHD restarted on 9/26 with good UF. Remains overloaded -> continue to pull. Support with NE - No ACE/ARB, spiro due to low BP and AKI - Off bb with low CO-OX   2. Pericardial effusion. hemorrhagic - s/p tap this admit with 1L bloody fluid - cytology  negative - echo 05/31/20: no recurrence - unclear etiology. will need non-contrast chest CT at some point to exclude malignancy   3. AKI - likely cardiorenal/ATN - CVVHD 9/17-9/22  Weight 263-> 213 (after CVVHD stopped weight 230 -> 240) - iHD 9/24 with only 1.3L off - Restarted CVVHD  9/26 - Good UF. Remains overloaded -> continue to pull. Support with NE  4. Acute on chronic hypoxic respiratory failure - predominantly due to volume overload but also has underlying COPD - need to resume CVVHD - Oxygen weaned to 3 liters.   5. CAD - No s/s ischemia - continue ASA/statin   6. P AFL/Fib  - now on po amio.  - Remains in Afib.  - not candidate for DC-CV currently as AC off due to hemorrhagic pericardial effusion   7. LE wounds  - venous stasis with PAD as well   8. ID  - WBC trending up. 11>16 >14 - Hypotensive overnight. Started on norepi.  - Check blood and urine cultures.   CRITICAL CARE Performed by: Glori Bickers  Total critical care time: 35 minutes  Critical care time was exclusive of separately billable procedures and treating other patients.  Critical care was necessary to treat or prevent imminent or life-threatening deterioration.  Critical care was time spent personally by me (independent of midlevel providers or residents) on the following activities: development of treatment plan with patient and/or surrogate as well as nursing, discussions with consultants, evaluation of patient's response to treatment, examination of patient, obtaining history from patient or surrogate, ordering and performing treatments and interventions, ordering and review of laboratory studies, ordering and review of radiographic studies, pulse oximetry and re-evaluation of patient's condition.    Length of Stay: [place], MD  06/08/2020, 6:29 AM  Advanced Heart Failure Team Pager [phone] (M-F; 7a - 4p)  Please contact [facility] for night-coverage after  hours (4p -7a ) and weekends on [email or web address]

## 2020-06-08 NOTE — Progress Notes (Signed)
Patient ID: Robert [name], male   DOB: 1951/02/04, 69 y.o.   MRN: [id number] [facility] Progress Note   Assessment/ Plan:   1. Acute kidney Injury on chronic kidney disease stage IIIb: Anuric with 392mL UOP overnight. AKI secondary to acute exacerbation of congestive heart failure in the setting of pericardial effusion.  Restarted CRRT 2 days ago after failure to respond to diuretics.  Overnight has been net -3.4 L; the big unknown here is whether he will have meaningful renal recovery to allow staying off RRT (I suspect he will need chronic HD).  2.  Acute exacerbation of systolic heart failure: Failed to respond to diuretics and currently ongoing extracorporeal fluid removal with CRRT/UF.  On midodrine 3 times a day along with ongoing levophed and milrinone. 3.  Pericardial effusion: Hemorrhagic and will require additional evaluation for malignancy when stable.  Initial cytology negative. 4.  Hyponatremia: Secondary to CHF exacerbation/acute kidney injury; monitor with CRRT/UF. 5.  Anemia: Secondary to chronic kidney disease and possibly exacerbated by acute illness/CHF decompensation.  Monitor for overt blood loss and will give intravenous iron/ESA.  Subjective:   Denies any acute events overnight--feeling fatigued this morning.   Objective:   BP (!) 95/57   Pulse 64   Temp 97.9 F (36.6 C) (Oral)   Resp 19   Ht 6' (1.829 m)   Wt 108.7 kg   SpO2 94%   BMI 32.50 kg/m   Intake/Output Summary (Last 24 hours) at 06/08/2020 0707 Last data filed at 06/08/2020 0700 Gross per 24 hour  Intake 929.66 ml  Output 4330 ml  Net -3400.34 ml   Weight change:   Physical Exam: Gen: Comfortably sleeping in bed, easy to awaken and engage in conversation CVS: Pulse regular rhythm and normal rate, distant S1-S2 Resp: Diminished breath sounds over bases, no distinct rales Abd: Soft, obese, nontender Ext: 2-3+ pitting lower extremity edema  Imaging: No results  found.  Labs: BMET Recent Labs  Lab 06/04/20 0343 06/05/20 0500 06/06/20 0410 06/06/20 1620 06/07/20 0402 06/07/20 1630 06/08/20 0405  NA 122* 130* 124* 123* 128* 130* 131*  K 5.4* 4.8 5.1 5.2* 5.0 4.8 5.0  CL 91* 96* 91* 90* 96* 97* 98  CO2 21* 25 22 22 23 24 25   GLUCOSE 362* 218* 291* 443* 155* 221* 73  BUN 67* 49* 68* 71* 51* 40* 30*  CREATININE 2.88* 2.40* 2.93* 3.09* 2.31* 1.92* 1.67*  CALCIUM 8.5* 8.5* 8.5* 8.2* 8.1* 7.9* 8.2*  PHOS 5.6* 4.9* 5.1* 5.9* 3.8 3.0 2.8   CBC Recent Labs  Lab 06/06/20 0410 06/06/20 1620 06/07/20 0402 06/08/20 0405  WBC 12.0* 11.3* 16.8* 14.3*  NEUTROABS  --   --  14.5* 11.8*  HGB 10.3* 10.7* 10.5* 9.6*  HCT 31.7* 33.9* 32.5* 30.3*  MCV 95.8 96.0 94.5 94.7  PLT 80* 91* 118* 110*   Medications:    . amiodarone  200 mg Oral BID  . B-complex with vitamin C  1 tablet Oral Daily  . Chlorhexidine Gluconate Cloth  6 each Topical Daily  . Chlorhexidine Gluconate Cloth  6 each Topical Q0600  . dextromethorphan-guaiFENesin  1 tablet Oral BID  . feeding supplement (ENSURE ENLIVE)  237 mL Oral BID BM  . fluticasone  1 spray Each Nare Daily  . folic acid  1 mg Oral Daily  . gabapentin  300 mg Oral BID  . influenza vaccine adjuvanted  0.5 mL Intramuscular Tomorrow-1000  . insulin aspart  0-20 Units Subcutaneous TID WC  .  insulin aspart  12 Units Subcutaneous TID WC  . insulin detemir  35 Units Subcutaneous BID  . mouth rinse  15 mL Mouth Rinse BID  . melatonin  3 mg Oral QHS  . midodrine  10 mg Oral TID WC  . mometasone-formoterol  2 puff Inhalation BID  . primidone  100 mg Oral QHS  . tamsulosin  0.4 mg Oral Daily   [name], MD 06/08/2020, 7:07 AM

## 2020-06-08 NOTE — Progress Notes (Signed)
PROGRESS NOTE  Robert [name]  DOB: 20-Oct-1950  PCP: Redmond School, MD HYW:[id number]  DOA: 05/30/2020  LOS: 20 days   Chief Complaint  Patient presents with  . Shortness of Breath   Brief narrative: Robert [name] is a 69 y.o. male with PMH of insulin-dependent diabetes mitis, HTN, CAD/MI s/p multiple interventions, chronic combined systolic and diastolic CHF, CKD 3 COPD, peripheral neuropathy, rheumatoid arthritis. Patient presented to the ED on 05/25/2020 with a week history of weight gain, shortness of breath.  He was in respiratory failure.  Chest x-ray showed pulmonary edema. He was admitted for acute exacerbation of CHF. 9/9, echocardiogram showed new systolic diastolic cardiomyopathy with an EF of 35 to 40%.   9/13, repeat echo showed circumferential pericardial effusion and signs of tamponade.  Underwent pericardiocentesis by CT surgery. Patient was continued on maximal dose of IV diuretics with tapering urine output.  His oxygen requirement worsened. Creatinine worsened.  9/17, Chest x-ray showed large right-sided pleural effusion as well as pulmonary vascular congestion.  Critical care and nephrology consultation were obtained.  HD catheter was placed.  Patient was started on CVVHD.  Subjective: Patient was seen and examined this morning. Propped up in bed.  Not in distress.  Remains on CRRT. Patient is back on CRRT since yesterday. Blood sugar level was over 400 yesterday.  Seems to be improving, fasting blood sugar 138 this morning. WBC count worsening.  Assessment/Plan: Acute hypoxemic respiratory failure -Multifactorial: CHF, pleural effusion, pericardial effusion -Continues to require 4 L oxygen by nasal cannula.  Acute exacerbation chronic combined systolic and diastolic CHF Essential hypertension -EF 35 to 40% -Patient remains massively volume overloaded.  Currently undergoing CRRT.  Heart failure team following.  Patient is on milrinone drip, Levophed  drip.  AKI on CKD stage IIIb -Likely cardiorenal.  Nephrology following.  Patient is on CRRT.   Recent Labs    06/02/20 0338 06/02/20 1600 06/03/20 0508 06/04/20 0343 06/05/20 0500 06/06/20 0410 06/06/20 1620 06/07/20 0402 06/07/20 1630 06/08/20 0405  BUN 22 28* 41* 67* 49* 68* 71* 51* 40* 30*  CREATININE 1.70* 2.08* 2.46* 2.88* 2.40* 2.93* 3.09* 2.31* 1.92* 1.67*   Bilateral pleural effusions -Improved pleural effusion on chest x-ray repeated on 9/20.  Pericardial effusion w/ tamponade  -status post pericardiocentesis on 9/13  A. fib -On amiodarone.  Remains in A. fib.  Currently off anticoagulation because of hemorrhagic pericarditis. -Not a candidate for DCCV per cardiology.  Hyperkalemia/hyperphosphatemia -Related to renal failure.  Nephrology following Recent Labs  Lab 06/02/20 0338 06/02/20 1600 06/03/20 0508 06/04/20 0343 06/06/20 0410 06/06/20 1620 06/07/20 0402 06/07/20 1630 06/08/20 0405  K 5.3*   < > 5.3*   < > 5.1 5.2* 5.0 4.8 5.0  MG 2.6*  --  2.4  --   --   --  2.1  --  2.4  PHOS 2.9   < > 4.9*   < > 5.1* 5.9* 3.8 3.0 2.8   < > = values in this interval not displayed.   Hyponatremia -Sodium level is fluctuating.  131 today Recent Labs  Lab 06/02/20 0338 06/02/20 1600 06/03/20 0508 06/04/20 0343 06/05/20 0500 06/06/20 0410 06/06/20 1620 06/07/20 0402 06/07/20 1630 06/08/20 0405  NA 134* 131* 131* 122* 130* 124* 123* 128* 130* 131*   Diabetes mellitus 2 with peripheral neuropathy -A1c 7.3 on 9/9 -Blood sugar level lower is running more than 200 mostly.  It was up to 440 yesterday.  131 this morning. -Diabetes care coordinator consult  appreciated.  -Blood cell this morning was improved to 118. With renal clearance from dialysis, patient's insulin requirement can fluctuate. -This morning, I reduced Levemir to 30 units twice daily, continue scheduled NovoLog Premeal to 12 units 3 times daily and sliding-scale insulin.  Continue  Accu-Cheks -Continue Neurontin 300 mg 3 times daily Recent Labs  Lab 06/08/20 0411 06/08/20 0508 06/08/20 0639 06/08/20 0823 06/08/20 1131  GLUCAP 70 135* 118* 149* 227*   Thrombocytopenia -Platelet level running low but fluctuating.  PF4 antibody sent. No immediate indication of argatroban. -Platelet level is up to 110 today. Recent Labs  Lab 06/02/20 0338 06/03/20 0508 06/03/20 1555 06/04/20 0343 06/05/20 0500 06/06/20 0410 06/06/20 1620 06/07/20 0402 06/08/20 0405  PLT 75* 56* 55* 68* 62* 80* 91* 118* 110*   Elevated liver enzymes -Improving.  Negative acute hepatitis panel. -Repeat liver function panel today showed some worsening. Recent Labs  Lab 06/04/20 1259 06/05/20 0500 06/06/20 0410 06/06/20 1620 06/07/20 0402 06/07/20 1630 06/08/20 0405  AST 31  --   --   --  70*  --   --   ALT 71*  --   --   --  110*  --   --   ALKPHOS 135*  --   --   --  144*  --   --   BILITOT 0.5  --   --   --  0.6  --   --   PROT 5.2*  --   --   --  5.7*  --   --   ALBUMIN 2.5*   < > 2.5* 2.5* 2.6*  2.5* 2.5* 2.5*   < > = values in this interval not displayed.   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  Pressure injury of skin  - Per documentation of flow sheets  Muscular deconditioning - PT/OT consults  Goals of care -palliative care consultation ordered.  Mobility: PT OT eval. encourage ambulation.   Code Status:   Code Status: Full Code  Nutritional status: Body mass index is 32.5 kg/m. Nutrition Problem: Increased nutrient needs Etiology: acute illness (AKI on CRRT) Signs/Symptoms: estimated needs Diet  Order            Diet Carb Modified Fluid consistency: Thin; Room service appropriate? Yes; Fluid restriction: 1200 mL Fluid  Diet effective now                 DVT prophylaxis: heparin 10,000 units/ 20 mL infusion syringe Start: 06/06/20 1745 heparin bolus via infusion syringe 1,000 Units Start: 06/06/20 1741 Place and maintain sequential compression device Start: 06/03/20 0900 Place TED hose Start: 05/31/20 0933 SCD's Start: 05/26/2020 1942   Antimicrobials:  None Fluid: None Consultants: Nephrology, cardiology Family Communication:  None at bedside  Status is: Inpatient  Remains inpatient appropriate because: Patient is on CRRT   Dispo: The patient is from: Home              Anticipated d/c is to: SNF most likely              Anticipated d/c date is: > 3 days  Patient currently is not medically stable to d/c.  Infusions:  .  prismasol BGK 4/2.5 500 mL/hr at 06/08/20 0605  .  prismasol BGK 4/2.5 300 mL/hr at 06/08/20 0601  . sodium chloride    . sodium chloride    . ferric gluconate (FERRLECIT/NULECIT) IV 125 mg (06/08/20 1040)  . heparin 10,000 units/ 20 mL infusion syringe 1,750 Units/hr (06/08/20 0925)  . milrinone 0.25 mcg/kg/min (06/08/20 1000)  . norepinephrine (LEVOPHED) Adult infusion 5 mcg/min (06/08/20 1000)  . prismasol BGK 4/2.5 1,500 mL/hr at 06/08/20 9702    Scheduled Meds: . amiodarone  200 mg Oral BID  . B-complex with vitamin C  1 tablet Oral Daily  . Chlorhexidine Gluconate Cloth  6 each Topical Daily  . Chlorhexidine Gluconate Cloth  6 each Topical Q0600  . dextromethorphan-guaiFENesin  1 tablet Oral BID  . feeding supplement (ENSURE ENLIVE)  237 mL Oral BID BM  . fluticasone  1 spray Each Nare Daily  . folic acid  1 mg Oral Daily  . gabapentin  300 mg Oral BID  . influenza vaccine adjuvanted  0.5 mL Intramuscular Tomorrow-1000  . insulin aspart  0-20 Units Subcutaneous TID WC  . insulin aspart  12 Units Subcutaneous TID WC  .  insulin detemir  30 Units Subcutaneous BID  . mouth rinse  15 mL Mouth Rinse BID  . melatonin  3 mg Oral QHS  . midodrine  10 mg Oral TID WC  . mometasone-formoterol  2 puff Inhalation BID  . primidone  100 mg Oral QHS  . tamsulosin  0.4 mg Oral Daily    Antimicrobials: Anti-infectives (From admission, onward)   None      PRN meds: sodium chloride, sodium chloride, acetaminophen, albuterol, alteplase, heparin, heparin, heparin, heparin, lidocaine (PF), lidocaine-prilocaine, loperamide, Muscle Rub, ondansetron (ZOFRAN) IV, oxyCODONE, pentafluoroprop-tetrafluoroeth, sodium chloride flush   Objective: Vitals:   06/08/20 0900 06/08/20 1000  BP: 99/67 106/63  Pulse: 77 (!) 115  Resp: (!) 29 (!) 22  Temp:    SpO2: 95% 99%    Intake/Output Summary (Last 24 hours) at 06/08/2020 1135 Last data filed at 06/08/2020 1100 Gross per 24 hour  Intake 1325.35 ml  Output 4509 ml  Net -3183.65 ml   Filed Weights   06/06/20 0341 06/06/20 1700 06/07/20 0500  Weight: 109.3 kg 107.7 kg 108.7 kg   Weight change:  Body mass index is 32.5 kg/m.   Physical Exam: General exam: Appears calm and comfortable. Not in physical distress.  On CRRT Skin: No rashes, lesions or ulcers. HEENT: Atraumatic, normocephalic, supple neck, no obvious bleeding Lungs: Diminished air entry in bases. Remains on supplemental oxygen. CVS: Rate controlled A. fib  GI/Abd soft, nontender, nondistended, bowel sound present CNS: Alert, awake, oriented to place and person Psychiatry: Mood appropriate Extremities: 2+ pedal edema bilaterally  Data Review: I have personally reviewed the laboratory data and studies available.  Recent Labs  Lab 06/05/20 0500 06/06/20 0410 06/06/20 1620 06/07/20 0402 06/08/20 0405  WBC 8.7 12.0* 11.3* 16.8* 14.3*  NEUTROABS  --   --   --  14.5* 11.8*  HGB 10.6* 10.3* 10.7* 10.5* 9.6*  HCT 33.5* 31.7* 33.9* 32.5* 30.3*  MCV 95.4 95.8 96.0 94.5 94.7  PLT 62* 80* 91* 118* 110*    Recent Labs  Lab 06/02/20 0338 06/02/20 1600 06/03/20 0508 06/04/20 0343 06/06/20 0410 06/06/20 1620 06/07/20 0402 06/07/20 1630 06/08/20 0405  NA 134*   < > 131*   < > 124* 123* 128*  130* 131*  K 5.3*   < > 5.3*   < > 5.1 5.2* 5.0 4.8 5.0  CL 103   < > 100   < > 91* 90* 96* 97* 98  CO2 24   < > 23   < > 22 22 23 24 25   GLUCOSE 185*   < > 173*   < > 291* 443* 155* 221* 73  BUN 22   < > 41*   < > 68* 71* 51* 40* 30*  CREATININE 1.70*   < > 2.46*   < > 2.93* 3.09* 2.31* 1.92* 1.67*  CALCIUM 8.0*   < > 8.8*   < > 8.5* 8.2* 8.1* 7.9* 8.2*  MG 2.6*  --  2.4  --   --   --  2.1  --  2.4  PHOS 2.9   < > 4.9*   < > 5.1* 5.9* 3.8 3.0 2.8   < > = values in this interval not displayed.   F/u labs ordered  Signed, Terrilee Croak, MD [facility] 06/08/2020

## 2020-06-09 LAB — CBC WITH DIFFERENTIAL/PLATELET
Abs Immature Granulocytes: 0.2 10*3/uL — ABNORMAL HIGH (ref 0.00–0.07)
Basophils Absolute: 0.1 10*3/uL (ref 0.0–0.1)
Basophils Relative: 0 %
Eosinophils Absolute: 0.2 10*3/uL (ref 0.0–0.5)
Eosinophils Relative: 1 %
HCT: 31.7 % — ABNORMAL LOW (ref 39.0–52.0)
Hemoglobin: 10.1 g/dL — ABNORMAL LOW (ref 13.0–17.0)
Immature Granulocytes: 1 %
Lymphocytes Relative: 4 %
Lymphs Abs: 0.6 10*3/uL — ABNORMAL LOW (ref 0.7–4.0)
MCH: 30.5 pg (ref 26.0–34.0)
MCHC: 31.9 g/dL (ref 30.0–36.0)
MCV: 95.8 fL (ref 80.0–100.0)
Monocytes Absolute: 1.4 10*3/uL — ABNORMAL HIGH (ref 0.1–1.0)
Monocytes Relative: 10 %
Neutro Abs: 11.8 10*3/uL — ABNORMAL HIGH (ref 1.7–7.7)
Neutrophils Relative %: 84 %
Platelets: 106 10*3/uL — ABNORMAL LOW (ref 150–400)
RBC: 3.31 MIL/uL — ABNORMAL LOW (ref 4.22–5.81)
RDW: 15.3 % (ref 11.5–15.5)
WBC: 14.2 10*3/uL — ABNORMAL HIGH (ref 4.0–10.5)
nRBC: 0 % (ref 0.0–0.2)

## 2020-06-09 LAB — RENAL FUNCTION PANEL
Albumin: 2.6 g/dL — ABNORMAL LOW (ref 3.5–5.0)
Albumin: 2.5 g/dL — ABNORMAL LOW (ref 3.5–5.0)
Anion gap: 8 (ref 5–15)
Anion gap: 7 (ref 5–15)
BUN: 24 mg/dL — ABNORMAL HIGH (ref 8–23)
BUN: 23 mg/dL (ref 8–23)
CO2: 26 mmol/L (ref 22–32)
CO2: 26 mmol/L (ref 22–32)
Calcium: 8.4 mg/dL — ABNORMAL LOW (ref 8.9–10.3)
Calcium: 8.3 mg/dL — ABNORMAL LOW (ref 8.9–10.3)
Chloride: 99 mmol/L (ref 98–111)
Chloride: 97 mmol/L — ABNORMAL LOW (ref 98–111)
Creatinine, Ser: 1.47 mg/dL — ABNORMAL HIGH (ref 0.61–1.24)
Creatinine, Ser: 1.71 mg/dL — ABNORMAL HIGH (ref 0.61–1.24)
GFR calc Af Amer: 56 mL/min — ABNORMAL LOW (ref >60)
GFR calc Af Amer: 47 mL/min — ABNORMAL LOW (ref >60)
GFR calc non Af Amer: 49 mL/min — ABNORMAL LOW (ref >60)
GFR calc non Af Amer: 41 mL/min — ABNORMAL LOW (ref >60)
Glucose, Bld: 75 mg/dL (ref 70–99)
Glucose, Bld: 144 mg/dL — ABNORMAL HIGH (ref 70–99)
Phosphorus: 2.5 mg/dL (ref 2.5–4.6)
Phosphorus: 2.9 mg/dL (ref 2.5–4.6)
Potassium: 5.1 mmol/L (ref 3.5–5.1)
Potassium: 5 mmol/L (ref 3.5–5.1)
Sodium: 133 mmol/L — ABNORMAL LOW (ref 135–145)
Sodium: 130 mmol/L — ABNORMAL LOW (ref 135–145)

## 2020-06-09 LAB — HEPARIN LEVEL (UNFRACTIONATED): Heparin Unfractionated: 0.62 IU/mL (ref 0.30–0.70)

## 2020-06-09 LAB — POCT I-STAT EG7
Acid-Base Excess: 3 mmol/L — ABNORMAL HIGH (ref 0.0–2.0)
Bicarbonate: 29.8 mmol/L — ABNORMAL HIGH (ref 20.0–28.0)
Calcium, Ion: 1.19 mmol/L (ref 1.15–1.40)
HCT: 32 % — ABNORMAL LOW (ref 39.0–52.0)
Hemoglobin: 10.9 g/dL — ABNORMAL LOW (ref 13.0–17.0)
O2 Saturation: 42 %
Patient temperature: 97.9
Potassium: 4.9 mmol/L (ref 3.5–5.1)
Sodium: 133 mmol/L — ABNORMAL LOW (ref 135–145)
TCO2: 31 mmol/L (ref 22–32)
pCO2, Ven: 52 mmHg (ref 44.0–60.0)
pH, Ven: 7.365 (ref 7.250–7.430)
pO2, Ven: 25 mmHg — CL (ref 32.0–45.0)

## 2020-06-09 LAB — POCT ACTIVATED CLOTTING TIME
Activated Clotting Time: 224 seconds
Activated Clotting Time: 219 seconds
Activated Clotting Time: 224 seconds
Activated Clotting Time: 191 seconds
Activated Clotting Time: 186 seconds
Activated Clotting Time: 241 seconds
Activated Clotting Time: 246 seconds
Activated Clotting Time: 246 seconds
Activated Clotting Time: 235 seconds
Activated Clotting Time: 219 seconds
Activated Clotting Time: 224 seconds
Activated Clotting Time: 213 seconds
Activated Clotting Time: 213 seconds
Activated Clotting Time: 208 seconds
Activated Clotting Time: 191 seconds

## 2020-06-09 LAB — GLUCOSE, CAPILLARY
Glucose-Capillary: 72 mg/dL (ref 70–99)
Glucose-Capillary: 188 mg/dL — ABNORMAL HIGH (ref 70–99)
Glucose-Capillary: 82 mg/dL (ref 70–99)
Glucose-Capillary: 116 mg/dL — ABNORMAL HIGH (ref 70–99)
Glucose-Capillary: 133 mg/dL — ABNORMAL HIGH (ref 70–99)

## 2020-06-09 LAB — COOXEMETRY PANEL
Carboxyhemoglobin: 1.3 % (ref 0.5–1.5)
Methemoglobin: 0.7 % (ref 0.0–1.5)
O2 Saturation: 60.7 %
Total hemoglobin: 10.3 g/dL — ABNORMAL LOW (ref 12.0–16.0)

## 2020-06-09 LAB — APTT: aPTT: 169 seconds (ref 24–36)

## 2020-06-09 LAB — MAGNESIUM: Magnesium: 2.6 mg/dL — ABNORMAL HIGH (ref 1.7–2.4)

## 2020-06-09 MED ORDER — MIDODRINE HCL 5 MG PO TABS
15.0000 mg | ORAL_TABLET | Freq: Three times a day (TID) | ORAL | Status: DC
  Administered 2020-06-09 – 2020-06-16 (×22): 15 mg via ORAL
  Filled 2020-06-09 (×24): qty 3

## 2020-06-09 MED ORDER — PRISMASOL BGK 0/2.5 32-2.5 MEQ/L IV SOLN
INTRAVENOUS | Status: DC
  Filled 2020-06-09 (×8): qty 5000

## 2020-06-09 MED ORDER — PRISMASOL BGK 0/2.5 32-2.5 MEQ/L IV SOLN
INTRAVENOUS | Status: DC
  Filled 2020-06-09 (×15): qty 5000

## 2020-06-09 MED ORDER — PROSOURCE PLUS PO LIQD
30.0000 mL | Freq: Two times a day (BID) | ORAL | Status: DC
  Administered 2020-06-10 – 2020-06-15 (×12): 30 mL via ORAL
  Filled 2020-06-09 (×10): qty 30

## 2020-06-09 NOTE — Consult Note (Signed)
Consultation Note Date: 06/09/2020   Patient Name: Robert [name]  DOB: [birth year]  MRN: [id number]  Age / Sex: 69 y.o., male  PCP: Robert School, MD Referring Physician: Terrilee Croak, MD  Reason for Consultation: Establishing goals of care and Psychosocial/spiritual support  HPI/Patient Profile: 69 y.o. male   admitted on 05/18/2020 with a past medical history  of insulin-dependent diabetes mitis, HTN, CAD/MI s/p multiple interventions, chronic combined systolic and diastolic CHF, CKD 3 COPD, peripheral neuropathy, rheumatoid arthritis.  Patient presented to the ED on 05/14/2020 with a week history of weight gain, shortness of breath.  He was in respiratory failure.  Chest x-ray showed pulmonary edema. He was admitted for acute exacerbation of CHF.  9/9, echocardiogram showed new systolic diastolic cardiomyopathy with an EF of 35 to 40%.  9/13, repeat echo showed circumferential pericardial effusion and signs of tamponade.  Underwent pericardiocentesis by CT surgery.  Patient was continued on maximal dose of IV diuretics with tapering urine output.  His oxygen requirement worsened. Creatinine worsened.   9/17, Chest x-ray showed large right-sided pleural effusion as well as pulmonary vascular congestion.    Currently patient remains on CRRT, requiring 4 L nasal cannula to maintain O2 sats, EF 35 to 45%, dyspneic with minimal exertion, he is weak/deconditioned.  Patient is high risk for decompensation secondary to multiple comorbidities.  Of note patient had Covid-30 March 2019  Patient and his family face treatment option decisions, advanced directives and    Clinical Assessment and Goals of Care:  This NP Robert [name] reviewed medical records, received report from team, assessed the patient and then meet at the patient's bedside  to discuss diagnosis, prognosis, GOCs,  and options.   Concept of  Palliative Care was introduced as specialized medical care for people and their families living with serious illness.  If focuses on providing relief from the symptoms and stress of a serious illness.  The goal is to improve quality of life for both the patient and the family.  Created space and opportunity for patient and his family to explore their thoughts and feelings regarding current medical situation.   This is hard conversation to have and today is the first time they are all really discussing the details.       A  discussion was had today regarding advanced directives.  Concepts specific to code status, artifical feeding and hydration, continued IV antibiotics and rehospitalization was had.  The difference between a aggressive medical intervention path  and a palliative comfort care path for this patient at this time was had.  Values and goals of care important to patient and family were attempted to be elicited.  I spoke with daughter/ Robert [name] and daughter in law/Robert [name] and son Robert [name] regarding the above topics by telephone with [name] permission.   Discussed with patient the importance of continued conversation with his  family and the medical providers regarding overall plan of care and treatment options,  ensuring decisions are within the context of the patients values and GOCs.  A f/u meeting is scheduled for Monday at 3:00pm in patient's room with daughter, son and daughter in law.   This nurse practitioner informed  the patient/family and the attending that I will be out of the hospital thru next week.  A PMT provider will meet them in the room at that time.  Questions and concerns addressed.  Patient  encouraged to call with questions or concerns.     PMT will continue to support holistically.           No documented HPOA or AD.  Discussed with patient and his family importance of these documents with the assistance of Spiritual care department.  Patients wants to  discuss with his family.   Hopefully a MOST form can be completed during f/u family meeting  SUMMARY OF RECOMMENDATIONS    Code Status/Advance Care Planning:  DNR-documented today, family support his decision  No artificial feeding now or in the future    Palliative Prophylaxis:   Aspiration, Bowel Regimen, Delirium Protocol, Frequent Pain Assessment and Oral Care  Additional Recommendations (Limitations, Scope, Preferences):  Full Scope Treatment   Continue to treat the treatable and hope for improvement    Prognosis:   Unable to determine  Discharge Planning: To Be Determined      Primary Diagnoses: Present on Admission: . Acute respiratory failure with hypoxemia ([place]) . CKD (chronic kidney disease), stage III . COPD (chronic obstructive pulmonary disease) ([place]) . Hyperlipidemia . Essential hypertension . Acute on chronic combined systolic and diastolic CHF (congestive heart failure) ([place]) . Controlled type 2 diabetes mellitus with hyperglycemia, without long-term current use of insulin ([place]) . COPD (chronic obstructive pulmonary disease) with emphysema ([place])   I have reviewed the medical record, interviewed the patient and family, and examined the patient. The following aspects are pertinent.  Past Medical History:  Diagnosis Date  . Anxiety   . CHF (congestive heart failure) ([place])   . Collagen vascular disease ([place])   . COPD (chronic obstructive pulmonary disease) ([place])   . Coronary artery disease   . COVID-19   . Full dentures   . GERD (gastroesophageal reflux disease)    Rolaids as needed  . High cholesterol   . History of MI (myocardial infarction)   . Hypertension    med. dosage increased 04/2016; has been on BP med. x 10 yrs.  . Incarcerated umbilical hernia 85/0277  . Inguinal hernia 05/2016   bilateral   . Insulin dependent diabetes mellitus   . Ischemic cardiomyopathy    a. 03/2015 EF 40-45% by LV gram.  . Left leg cellulitis 10/08/2016  .  PAF (paroxysmal atrial fibrillation) ([facility])    on home amio+metoprolol, plavix  . Rheumatoid arthritis(714.0)    Social History   Socioeconomic History  . Marital status: Widowed    Spouse name: Not on file  . Number of children: Not on file  . Years of education: Not on file  . Highest education level: Not on file  Occupational History  . Occupation: Clinical biochemist  Tobacco Use  . Smoking status: Current Some Day Smoker    Packs/day: 1.00    Types: Cigarettes    Last attempt to quit: 06/11/2015    Years since quitting: 5.0  . Smokeless tobacco: Never Used  . Tobacco comment: pt states smoking less than half a pack  Vaping Use  . Vaping Use: Never used  Substance and Sexual Activity  . Alcohol use: No  . Drug use: No  .  Sexual activity: Yes  Other Topics Concern  . Not on file  Social History Narrative  . Not on file   Social Determinants of Health   Financial Resource Strain:   . Difficulty of Paying Living Expenses: Not on file  Food Insecurity: No Food Insecurity  . Worried About Charity fundraiser in the Last Year: Never true  . Ran Out of Food in the Last Year: Never true  Transportation Needs: No Transportation Needs  . Lack of Transportation (Medical): No  . Lack of Transportation (Non-Medical): No  Physical Activity:   . Days of Exercise per Week: Not on file  . Minutes of Exercise per Session: Not on file  Stress: No Stress Concern Present  . Feeling of Stress : Not at all  Social Connections:   . Frequency of Communication with Friends and Family: Not on file  . Frequency of Social Gatherings with Friends and Family: Not on file  . Attends Religious Services: Not on file  . Active Member of Clubs or Organizations: Not on file  . Attends Archivist Meetings: Not on file  . Marital Status: Not on file   Family History  Problem Relation Age of Onset  . Heart disease Father   . Colon cancer Brother        early 75s  . Hypertension  Mother   . Heart disease Mother   . Colon cancer Mother        died age 1. late 21s early 92s at diagnosis.   . Lung cancer Mother    Scheduled Meds: . amiodarone  200 mg Oral BID  . B-complex with vitamin C  1 tablet Oral Daily  . Chlorhexidine Gluconate Cloth  6 each Topical Daily  . Chlorhexidine Gluconate Cloth  6 each Topical Q0600  . dextromethorphan-guaiFENesin  1 tablet Oral BID  . feeding supplement (ENSURE ENLIVE)  237 mL Oral BID BM  . fluticasone  1 spray Each Nare Daily  . folic acid  1 mg Oral Daily  . gabapentin  300 mg Oral BID  . influenza vaccine adjuvanted  0.5 mL Intramuscular Tomorrow-1000  . insulin aspart  0-20 Units Subcutaneous TID WC  . insulin aspart  12 Units Subcutaneous TID WC  . insulin detemir  30 Units Subcutaneous BID  . mouth rinse  15 mL Mouth Rinse BID  . melatonin  3 mg Oral QHS  . midodrine  15 mg Oral TID WC  . mometasone-formoterol  2 puff Inhalation BID  . primidone  100 mg Oral QHS  . tamsulosin  0.4 mg Oral Daily   Continuous Infusions: . sodium chloride    . sodium chloride    . ferric gluconate (FERRLECIT/NULECIT) IV Stopped (06/09/20 0902)  . heparin 10,000 units/ 20 mL infusion syringe 1,700 Units/hr (06/09/20 1015)  . milrinone 0.125 mcg/kg/min (06/09/20 1000)  . norepinephrine (LEVOPHED) Adult infusion 7 mcg/min (06/09/20 1000)  . prismasol BGK 2/2.5 replacement solution 500 mL/hr at 06/09/20 0850  . prismasol BGK 2/2.5 replacement solution 200 mL/hr at 06/09/20 0849  . prismasol BGK 4/2.5 1,500 mL/hr at 06/09/20 0852   PRN Meds:.sodium chloride, sodium chloride, acetaminophen, albuterol, alteplase, heparin, heparin, heparin, heparin, lidocaine (PF), lidocaine-prilocaine, loperamide, Muscle Rub, ondansetron (ZOFRAN) IV, oxyCODONE, pentafluoroprop-tetrafluoroeth, sodium chloride flush Medications Prior to Admission:  Prior to Admission medications   Medication Sig Start Date End Date Taking? Authorizing Provider  acarbose  (PRECOSE) 100 MG tablet Take 1 tablet (100 mg total) by mouth 3 (three)  times daily with meals. 06/06/19  Yes Love, Ivan Anchors, PA-C  acetaminophen (TYLENOL) 650 MG CR tablet Take 650 mg by mouth every 8 (eight) hours as needed for pain.   Yes [provider]  aspirin EC 81 MG tablet Take 1 tablet (81 mg total) by mouth daily. 02/12/20  Yes Croitoru, Mihai, MD  atorvastatin (LIPITOR) 80 MG tablet Take 1 tablet (80 mg total) by mouth daily at 6 PM. 12/30/19  Yes Croitoru, Mihai, MD  clopidogrel (PLAVIX) 75 MG tablet Take 1 tablet (75 mg total) by mouth daily. 10/07/19  Yes Kroeger, Daleen Snook M., PA-C  fish oil-omega-3 fatty acids 1000 MG capsule Take 1 g by mouth daily.    Yes [provider]  fluticasone Asencion Islam) 50 MCG/ACT nasal spray  11/19/19  Yes [provider]  folic acid (FOLVITE) 1 MG tablet Take 1 tablet (1 mg total) by mouth daily. 06/06/19  Yes Love, Ivan Anchors, PA-C  furosemide (LASIX) 40 MG tablet Take 2 tablets (80 mg total) by mouth daily. 04/12/20  Yes Croitoru, Mihai, MD  gabapentin (NEURONTIN) 300 MG capsule Take 300 mg by mouth 3 (three) times daily.   Yes [provider]  glimepiride (AMARYL) 2 MG tablet Take 1 tablet (2 mg total) by mouth daily with breakfast. 06/06/19  Yes Love, Pamela S, PA-C  Insulin Glargine (TOUJEO SOLOSTAR [place]) Inject 25 Units into the skin at bedtime. 25 units    Yes [provider]  isosorbide mononitrate (IMDUR) 60 MG 24 hr tablet Take 1 tablet (60 mg total) by mouth daily. 02/12/20 02/06/21 Yes Croitoru, Mihai, MD  losartan (COZAAR) 25 MG tablet Take 0.5 tablets (12.5 mg total) by mouth daily. 09/29/19  Yes Kroeger, Daleen Snook M., PA-C  metoprolol tartrate (LOPRESSOR) 25 MG tablet Take 1 tablet (25 mg total) by mouth 2 (two) times daily. 09/29/19  Yes Kroeger, Daleen Snook M., PA-C  mometasone-formoterol (DULERA) 200-5 MCG/ACT AERO Inhale 2 puffs into the lungs 2 (two) times daily. 06/06/19  Yes Love, Ivan Anchors, PA-C  nitroGLYCERIN  (NITROSTAT) 0.4 MG SL tablet Place 1 tablet (0.4 mg total) under the tongue every 5 (five) minutes x 3 doses as needed for chest pain. 04/24/16  Yes Croitoru, Mihai, MD  potassium chloride SA (KLOR-CON) 20 MEQ tablet Take 1 tablet (20 mEq total) by mouth 2 (two) times daily. 02/03/20  Yes Kroeger, Daleen Snook M., PA-C  primidone (MYSOLINE) 50 MG tablet Take 100 mg by mouth at bedtime. 09/18/19  Yes [provider]  ALPRAZolam Duanne Moron) 0.5 MG tablet Take 1 tablet (0.5 mg total) by mouth 3 (three) times daily as needed for anxiety or sleep. Patient not taking: Reported on 05/23/2020 06/06/19   Love, Ivan Anchors, PA-C  collagenase (SANTYL) ointment Apply topically daily. And cover with damp to dry dressing daily. Change more frequently if soiled 06/06/19   Love, Ivan Anchors, PA-C  terbinafine (LAMISIL) 250 MG tablet Take 250 mg by mouth daily. Patient not taking: Reported on 05/16/2020 11/26/19   [provider]  vitamin C (VITAMIN C) 250 MG tablet Take 1 tablet (250 mg total) by mouth 2 (two) times daily. Patient not taking: Reported on 06/05/2020 06/06/19   Bary Leriche, PA-C   Allergies  Allergen Reactions  . Ivp Dye [Iodinated Diagnostic Agents] Itching and Rash   Review of Systems  Respiratory: Positive for shortness of breath.   Gastrointestinal: Positive for abdominal distention.  Neurological: Positive for weakness.    Physical Exam Constitutional:      Appearance:  He is ill-appearing.  Cardiovascular:     Rate and Rhythm: Normal rate.  Musculoskeletal:     Comments: - generalized weakness  Skin:    General: Skin is warm and dry.  Neurological:     Mental Status: He is alert.     Vital Signs: BP 104/62   Pulse 83   Temp 97.7 F (36.5 C) (Oral)   Resp 19   Ht 6' (1.829 m)   Wt 104.6 kg   SpO2 98%   BMI 31.28 kg/m  Pain Scale: 0-10 POSS *See Group Information*: S-Acceptable,Sleep, easy to arouse Pain Score: 0-No pain   SpO2: SpO2: 98 % O2 Device:SpO2: 98 % O2 Flow  Rate: .O2 Flow Rate (L/min): 3 L/min  IO: Intake/output summary:   Intake/Output Summary (Last 24 hours) at 06/09/2020 1029 Last data filed at 06/09/2020 1000 Gross per 24 hour  Intake 1336.11 ml  Output 5381 ml  Net -4044.89 ml    LBM: Last BM Date: 06/06/20 Baseline Weight: Weight: 108.4 kg Most recent weight: Weight: 104.6 kg     Palliative Assessment/Data: 30 %    Discussed with Dr Pietro Cassis and bedside RN Kenney Houseman  Time In: 1400 Time Out: 1515 Time Total: 75  minutes Greater than 50%  of this time was spent counseling and coordinating care related to the above assessment and plan.  Signed by: Robert Lessen, NP   Please contact Palliative Medicine Team phone at [phone] for questions and concerns.  For individual provider: See Shea Evans

## 2020-06-09 NOTE — Progress Notes (Signed)
PT Cancellation Note  Patient Details Name: Robert [name] MRN: [id number] DOB: [birth year]   Cancelled Treatment:    Reason Eval/Treat Not Completed: Other (comment) Checked back on pt in pm and he declined PT, stating he is going to get a bath in a few mins and wants to safe his energy.  Will f/u as able. Abran Richard, PT Acute Rehab Services Pager [phone] [facility] [place] 06/09/2020, 4:48 PM

## 2020-06-09 NOTE — Progress Notes (Signed)
PT Cancellation Note  Patient Details Name: Robert [name] MRN: [id number] DOB: 02-21-1951   Cancelled Treatment:    Reason Eval/Treat Not Completed: Other (comment) (pt reports he is willing to mobilize today but not yet ready)   Christphor Groft B Kemiya Batdorf 06/09/2020, 8:26 AM  [place] Pager: [phone] Office: [phone]

## 2020-06-09 NOTE — Progress Notes (Signed)
PROGRESS NOTE  Robert [name]  DOB: [birth year]  PCP: Redmond School, MD ZSW:[id number]  DOA: 05/12/2020  LOS: 21 days   Chief Complaint  Patient presents with  . Shortness of Breath   Brief narrative: Robert [name] is a 69 y.o. male with PMH of insulin-dependent diabetes mitis, HTN, CAD/MI s/p multiple interventions, chronic combined systolic and diastolic CHF, CKD 3 COPD, peripheral neuropathy, rheumatoid arthritis. Patient presented to the ED on 05/21/2020 with a week history of weight gain, shortness of breath.  He was in respiratory failure.  Chest x-ray showed pulmonary edema. He was admitted for acute exacerbation of CHF. 9/9, echocardiogram showed new systolic diastolic cardiomyopathy with an EF of 35 to 40%.   9/13, repeat echo showed circumferential pericardial effusion and signs of tamponade.  Underwent pericardiocentesis by CT surgery. Patient was continued on maximal dose of IV diuretics with tapering urine output.  His oxygen requirement worsened. Creatinine worsened.  9/17, Chest x-ray showed large right-sided pleural effusion as well as pulmonary vascular congestion.  Critical care and nephrology consultation were obtained.  HD catheter was placed.  Patient was started on CVVHD.  Subjective: Patient was seen and examined this morning. Remains in CRRT.  Taking his breakfast.  Blood sugar level less than 100 this morning.  Assessment/Plan: Acute hypoxemic respiratory failure -Multifactorial: CHF, pleural effusion, pericardial effusion -Continues to require 4 L oxygen by nasal cannula.  Acute exacerbation chronic combined systolic and diastolic CHF Essential hypertension -EF 35 to 40% -Patient remains massively volume overloaded.  Currently undergoing CRRT.  Heart failure team following.  Patient is on milrinone drip, Levophed drip.  AKI on CKD stage IIIb -Likely cardiorenal.  Nephrology following.  Patient is on CRRT.   Recent Labs    06/03/20 0508 06/04/20 0343  06/05/20 0500 06/06/20 0410 06/06/20 1620 06/07/20 0402 06/07/20 1630 06/08/20 0405 06/08/20 1644 06/09/20 0405  BUN 41* 67* 49* 68* 71* 51* 40* 30* 29* 24*  CREATININE 2.46* 2.88* 2.40* 2.93* 3.09* 2.31* 1.92* 1.67* 1.60* 1.47*   Bilateral pleural effusions -Improved pleural effusion on chest x-ray repeated on 9/20.  Pericardial effusion w/ tamponade  -status post pericardiocentesis on 9/13  A. fib -On amiodarone.  Remains in A. fib.  Currently off anticoagulation because of hemorrhagic pericarditis. -Not a candidate for DCCV per cardiology.  Hyperkalemia/hyperphosphatemia -Related to renal failure.  Nephrology following Recent Labs  Lab 06/03/20 0508 06/04/20 0343 06/07/20 0402 06/07/20 1630 06/08/20 0405 06/08/20 1644 06/09/20 0405  K 5.3*   < > 5.0 4.8 5.0 5.4* 5.1  MG 2.4  --  2.1  --  2.4  --  2.6*  PHOS 4.9*   < > 3.8 3.0 2.8 2.6 2.5   < > = values in this interval not displayed.   Hyponatremia -Sodium level is fluctuating.  [place]  Lab 06/03/20 0508 06/04/20 0343 06/05/20 0500 06/06/20 0410 06/06/20 1620 06/07/20 0402 06/07/20 1630 06/08/20 0405 06/08/20 1644 06/09/20 0405  NA 131* 122* 130* 124* 123* 128* 130* 131* 134* 133*   Diabetes mellitus 2 with peripheral neuropathy -A1c 7.3 on 9/9 -Blood sugar level lower is running more than 200 mostly.  It was up to 440 yesterday.  131 this morning. -Diabetes care coordinator consult appreciated.  -Currently on Levemir to 30 units twice daily, continue scheduled NovoLog Premeal to 12 units 3 times daily and sliding-scale insulin.  Continue Accu-Cheks -Continue Neurontin 300 mg 3 times daily Recent Labs  Lab 06/08/20 1701 06/08/20 2157 06/09/20 3220 06/09/20 2542  06/09/20 1112  GLUCAP 162* 81 72 188* 82   Thrombocytopenia -Platelet level running low but fluctuating.  PF4 antibody sent. No immediate indication of argatroban. -Platelet level is up to 106 today. Recent Labs  Lab  06/03/20 0508 06/03/20 1555 06/04/20 0343 06/05/20 0500 06/06/20 0410 06/06/20 1620 06/07/20 0402 06/08/20 0405 06/09/20 0405  PLT 56* 55* 68* 62* 80* 91* 118* 110* 106*   Elevated liver enzymes -Improving.  Negative acute hepatitis panel. -Repeat liver function panel today showed some worsening. Recent Labs  Lab 06/04/20 1259 06/05/20 0500 06/07/20 0402 06/07/20 1630 06/08/20 0405 06/08/20 1644 06/09/20 0405  AST 31  --  70*  --   --   --   --   ALT 71*  --  110*  --   --   --   --   ALKPHOS 135*  --  144*  --   --   --   --   BILITOT 0.5  --  0.6  --   --   --   --   PROT 5.2*  --  5.7*  --   --   --   --   ALBUMIN 2.5*   < > 2.6*  2.5* 2.5* 2.5* 2.6* 2.6*   < > = values in this interval not displayed.   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  Pressure injury of skin  - Per documentation of flow sheets  Muscular deconditioning - PT/OT consults  Goals of care -palliative care consultation ordered.  Mobility: PT OT eval. encourage ambulation.   Code Status:   Code Status: Full Code  Nutritional status: Body mass index is 31.28 kg/m. Nutrition Problem: Increased nutrient needs Etiology: acute illness (AKI on CRRT) Signs/Symptoms: estimated needs Diet Order            Diet Carb Modified Fluid consistency: Thin; Room service appropriate? Yes; Fluid restriction: 1200 mL Fluid  Diet effective now                 DVT prophylaxis: heparin 10,000 units/ 20 mL infusion syringe Start: 06/06/20 1745 heparin bolus via infusion syringe 1,000 Units Start: 06/06/20 1741 Place and maintain sequential  compression device Start: 06/03/20 0900 Place TED hose Start: 05/31/20 0933 SCD's Start: 06/05/2020 1942   Antimicrobials:  None Fluid: None Consultants: Nephrology, cardiology Family Communication:  None at bedside  Status is: Inpatient  Remains inpatient appropriate because: Patient is on CRRT   Dispo: The patient is from: Home              Anticipated d/c is to: SNF most likely              Anticipated d/c date is: > 3 days              Patient currently is not medically stable to d/c.  Infusions:  . sodium chloride    . sodium chloride    . ferric gluconate (FERRLECIT/NULECIT) IV Stopped (06/09/20 0902)  . heparin 10,000 units/ 20 mL infusion syringe 1,400 Units/hr (06/09/20 1317)  . milrinone 0.125 mcg/kg/min (06/09/20 1300)  . norepinephrine (LEVOPHED) Adult  infusion 5 mcg/min (06/09/20 1300)  . prismasol BGK 2/2.5 replacement solution 500 mL/hr at 06/09/20 0850  . prismasol BGK 2/2.5 replacement solution 200 mL/hr at 06/09/20 0849  . prismasol BGK 4/2.5 1,500 mL/hr at 06/09/20 1224    Scheduled Meds: . (feeding supplement) PROSource Plus  30 mL Oral BID BM  . amiodarone  200 mg Oral BID  . B-complex with vitamin C  1 tablet Oral Daily  . Chlorhexidine Gluconate Cloth  6 each Topical Daily  . Chlorhexidine Gluconate Cloth  6 each Topical Q0600  . dextromethorphan-guaiFENesin  1 tablet Oral BID  . feeding supplement (ENSURE ENLIVE)  237 mL Oral BID BM  . fluticasone  1 spray Each Nare Daily  . folic acid  1 mg Oral Daily  . gabapentin  300 mg Oral BID  . influenza vaccine adjuvanted  0.5 mL Intramuscular Tomorrow-1000  . insulin aspart  0-20 Units Subcutaneous TID WC  . insulin aspart  12 Units Subcutaneous TID WC  . insulin detemir  30 Units Subcutaneous BID  . mouth rinse  15 mL Mouth Rinse BID  . melatonin  3 mg Oral QHS  . midodrine  15 mg Oral TID WC  . mometasone-formoterol  2 puff Inhalation BID  . primidone  100 mg Oral QHS  . tamsulosin  0.4 mg Oral  Daily    Antimicrobials: Anti-infectives (From admission, onward)   None      PRN meds: sodium chloride, sodium chloride, acetaminophen, albuterol, alteplase, heparin, heparin, heparin, heparin, lidocaine (PF), lidocaine-prilocaine, loperamide, Muscle Rub, ondansetron (ZOFRAN) IV, oxyCODONE, pentafluoroprop-tetrafluoroeth, sodium chloride flush   Objective: Vitals:   06/09/20 1300 06/09/20 1330  BP: (!) 85/60 98/63  Pulse: 97 94  Resp: 16 20  Temp:    SpO2: 100% 100%    Intake/Output Summary (Last 24 hours) at 06/09/2020 1344 Last data filed at 06/09/2020 1317 Gross per 24 hour  Intake 1454.95 ml  Output 5424 ml  Net -3969.05 ml   Filed Weights   06/06/20 1700 06/07/20 0500 06/09/20 0600  Weight: 107.7 kg 108.7 kg 104.6 kg   Weight change:  Body mass index is 31.28 kg/m.   Physical Exam: General exam: Appears calm and comfortable.  Not in physical distress on CRRT Skin: No rashes, lesions or ulcers. HEENT: Atraumatic, normocephalic, supple neck, no obvious bleeding Lungs: Diminished air entry in bases. Remains on supplemental oxygen. CVS: Rate controlled A. fib  GI/Abd soft, nontender, nondistended, bowel sound present CNS: Alert, awake, oriented to place and person Psychiatry: Mood appropriate Extremities: 2+ pedal edema bilaterally  Data Review: I have personally reviewed the laboratory data and studies available.  Recent Labs  Lab 06/06/20 0410 06/06/20 1620 06/07/20 0402 06/08/20 0405 06/09/20 0405  WBC 12.0* 11.3* 16.8* 14.3* 14.2*  NEUTROABS  --   --  14.5* 11.8* 11.8*  HGB 10.3* 10.7* 10.5* 9.6* 10.1*  HCT 31.7* 33.9* 32.5* 30.3* 31.7*  MCV 95.8 96.0 94.5 94.7 95.8  PLT 80* 91* 118* 110* 106*   Recent Labs  Lab 06/03/20 0508 06/04/20 0343 06/07/20 0402 06/07/20 1630 06/08/20 0405 06/08/20 1644 06/09/20 0405  NA 131*   < > 128* 130* 131* 134* 133*  K 5.3*   < > 5.0 4.8 5.0 5.4* 5.1  CL 100   < > 96* 97* 98 100 99  CO2 23   < > 23 24 25  26 26   GLUCOSE 173*   < > 155* 221* 73 157* 75  BUN 41*   < >  51* 40* 30* 29* 24*  CREATININE 2.46*   < > 2.31* 1.92* 1.67* 1.60* 1.47*  CALCIUM 8.8*   < > 8.1* 7.9* 8.2* 8.5* 8.4*  MG 2.4  --  2.1  --  2.4  --  2.6*  PHOS 4.9*   < > 3.8 3.0 2.8 2.6 2.5   < > = values in this interval not displayed.   F/u labs ordered  Signed, Terrilee Croak, MD [facility] 06/09/2020

## 2020-06-09 NOTE — Progress Notes (Signed)
Advanced Heart Failure Rounding Note  PCP-Cardiologist: Sanda Klein, MD   Subjective:    CVVHD restarted on 9/26. Pulling up to -150/hr. Weight down to 230. (dry weight this admit 213-218)  Minimal urine output.   On norepi 7 mcg + milrinone 0.25 mcg. CO-OX 61%.    WBC 11>16>14> 14  Feels weak. Mildly SOB. + orthopnea.    Objective:   Weight Range: 104.6 kg Body mass index is 31.28 kg/m.   Vital Signs:   Temp:  [97.4 F (36.3 C)-98.3 F (36.8 C)] 97.7 F (36.5 C) (09/29 0800) Pulse Rate:  [59-120] 71 (09/29 0830) Resp:  [13-30] 21 (09/29 0830) BP: (78-122)/(49-74) 102/59 (09/29 0830) SpO2:  [90 %-100 %] 98 % (09/29 0837) Weight:  [104.6 kg] 104.6 kg (09/29 0600) Last BM Date: 06/06/20  Weight change: Filed Weights   06/06/20 1700 06/07/20 0500 06/09/20 0600  Weight: 107.7 kg 108.7 kg 104.6 kg    Intake/Output:   Intake/Output Summary (Last 24 hours) at 06/09/2020 0847 Last data filed at 06/09/2020 0810 Gross per 24 hour  Intake 1186.93 ml  Output 5167 ml  Net -3980.07 ml      Physical Exam    General:  Lying in bed. weak appearing. No resp difficulty HEENT: normal Neck: supple. JVP to jaw Carotids 2+ bilat; no bruits. No lymphadenopathy or thryomegaly appreciated. Cor: PMI nondisplaced. Irregular rate & rhythm. 2/6 TR Lungs: coarse Abdomen: soft, nontender, + distended. No hepatosplenomegaly. No bruits or masses. Good bowel sounds. Extremities: no cyanosis, clubbing, rash, 3+ edema Neuro: alert & orientedx3, cranial nerves grossly intact. moves all 4 extremities w/o difficulty. Affect pleasant   Telemetry    A fib 90-110s Personally reviewed  Labs    CBC Recent Labs    06/08/20 0405 06/09/20 0405  WBC 14.3* 14.2*  NEUTROABS 11.8* 11.8*  HGB 9.6* 10.1*  HCT 30.3* 31.7*  MCV 94.7 95.8  PLT 110* 242*   Basic Metabolic Panel Recent Labs    06/08/20 0405 06/08/20 0405 06/08/20 1644 06/09/20 0405  NA 131*   < > 134* 133*  K  5.0   < > 5.4* 5.1  CL 98   < > 100 99  CO2 25   < > 26 26  GLUCOSE 73   < > 157* 75  BUN 30*   < > 29* 24*  CREATININE 1.67*   < > 1.60* 1.47*  CALCIUM 8.2*   < > 8.5* 8.4*  MG 2.4  --   --  2.6*  PHOS 2.8   < > 2.6 2.5   < > = values in this interval not displayed.   Liver Function Tests Recent Labs    06/07/20 0402 06/07/20 1630 06/08/20 1644 06/09/20 0405  AST 70*  --   --   --   ALT 110*  --   --   --   ALKPHOS 144*  --   --   --   BILITOT 0.6  --   --   --   PROT 5.7*  --   --   --   ALBUMIN 2.6*  2.5*   < > 2.6* 2.6*   < > = values in this interval not displayed.   No results for input(s): LIPASE, AMYLASE in the last 72 hours. Cardiac Enzymes No results for input(s): CKTOTAL, CKMB, CKMBINDEX, TROPONINI in the last 72 hours.  BNP: BNP (last 3 results) Recent Labs    07/03/19 2313 05/16/20 1228 05/28/2020 1310  BNP  346.0* 149.0* 157.0*    ProBNP (last 3 results) No results for input(s): PROBNP in the last 8760 hours.   D-Dimer No results for input(s): DDIMER in the last 72 hours. Hemoglobin A1C No results for input(s): HGBA1C in the last 72 hours. Fasting Lipid Panel No results for input(s): CHOL, HDL, LDLCALC, TRIG, CHOLHDL, LDLDIRECT in the last 72 hours. Thyroid Function Tests No results for input(s): TSH, T4TOTAL, T3FREE, THYROIDAB in the last 72 hours.  Invalid input(s): FREET3  Other results:   Imaging    No results found.   Medications:     Scheduled Medications: . amiodarone  200 mg Oral BID  . B-complex with vitamin C  1 tablet Oral Daily  . Chlorhexidine Gluconate Cloth  6 each Topical Daily  . Chlorhexidine Gluconate Cloth  6 each Topical Q0600  . dextromethorphan-guaiFENesin  1 tablet Oral BID  . feeding supplement (ENSURE ENLIVE)  237 mL Oral BID BM  . fluticasone  1 spray Each Nare Daily  . folic acid  1 mg Oral Daily  . gabapentin  300 mg Oral BID  . influenza vaccine adjuvanted  0.5 mL Intramuscular Tomorrow-1000  .  insulin aspart  0-20 Units Subcutaneous TID WC  . insulin aspart  12 Units Subcutaneous TID WC  . insulin detemir  30 Units Subcutaneous BID  . mouth rinse  15 mL Mouth Rinse BID  . melatonin  3 mg Oral QHS  . midodrine  10 mg Oral TID WC  . mometasone-formoterol  2 puff Inhalation BID  . primidone  100 mg Oral QHS  . tamsulosin  0.4 mg Oral Daily    Infusions: . sodium chloride    . sodium chloride    . ferric gluconate (FERRLECIT/NULECIT) IV Stopped (06/08/20 1144)  . heparin 10,000 units/ 20 mL infusion syringe 1,750 Units/hr (06/09/20 0644)  . milrinone 0.25 mcg/kg/min (06/09/20 0800)  . norepinephrine (LEVOPHED) Adult infusion 7 mcg/min (06/09/20 0800)  . prismasol BGK 2/2.5 replacement solution    . prismasol BGK 2/2.5 replacement solution    . 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 Assessment/Plan   1. Acute on chronic systolic HF due to iCM - Echo 05/2019 with EF 50-55%, - Echo 9/21 EF 35-40% with moderate to large pericardial effusion - Massively volume overloaded this admit and required CVVHD - Milrinone started on 9/25 for co-ox 53%. NE added to support CVVHD. Co-ox now 69% - Volume status improved with CVVHD (263 -> 213) then trended back upto 240 with iHD - CVVHD restarted on 9/26. Continues with good UF but requiring NE and milrinone to support. Remains overloaded -> continue to pull. - No ACE/ARB, spiro due to low BP and AKI - Off bb with low CO-OX  - He appears unable to tolerate even CVVHD without inotrope support  (though this may be confounded by recent sepsis). At this point, I doubt he will be able to get effective iHD. Only hope for survival seems to be recovery of renal function which I am not optimistic about. Will need to involve palliative care. Will pull as much fluid as possible on CVVHD. Then attempt to get  back on iHD and wean inotropes (or wait for recovery). If fails, then only option would be Palliative Care.  - UNNA boots  2. Pericardial effusion. hemorrhagic - s/p tap this admit with 1L bloody fluid - cytology negative - echo 05/31/20: no recurrence - unclear etiology. will need non-contrast chest CT at some point to exclude malignancy   3. AKI - likely cardiorenal/ATN - CVVHD 9/17-9/22  Weight 263-> 213 (after CVVHD stopped weight 230 -> 240) - iHD 9/24 with only 1.3L off - Restarted CVVHD 9/26 - Good UF. Remains overloaded -> continue to pull. Support with NE. Increase midodrine to 15. Wean milrinone to 0.125.  - See plan abvoe  4. Acute on chronic hypoxic respiratory failure - predominantly due to volume overload but also has underlying COPD - need to resume CVVHD - on 4L HF  5. CAD - No s/s ischemia - continue ASA/statin   6. P AFL/Fib  - now on po amio.  - Remains in Afib.  - not candidate for DC-CV currently as AC off due to hemorrhagic pericardial effusion   7. LE wounds  - venous stasis with PAD as well   8. ID  - WBC stable. Afebrile - Culturese  CRITICAL CARE Performed by: Glori Bickers  Total critical care time: 35 minutes  Critical care time was exclusive of separately billable procedures and treating other patients.  Critical care was necessary to treat or prevent imminent or life-threatening deterioration.  Critical care was time spent personally by me (independent of midlevel providers or residents) on the following activities: development of treatment plan with patient and/or surrogate as well as nursing, discussions with consultants,  evaluation of patient's response to treatment, examination of patient, obtaining history from patient or surrogate, ordering and performing treatments and interventions, ordering and review of laboratory studies, ordering and review of radiographic studies, pulse oximetry and re-evaluation of patient's condition.    Length of Stay: [place], MD  06/09/2020, 8:47 AM  Advanced Heart Failure Team Pager [phone] (M-F; 7a - 4p)  Please contact [facility] for night-coverage after hours (4p -7a ) and weekends on [email or web address]

## 2020-06-09 NOTE — Progress Notes (Signed)
Patient ID: ANQUAN AZZARELLO, male   DOB: 06/03/1951, 69 y.o.   MRN: [id number] [facility] Progress Note   Assessment/ Plan:   1. Acute kidney Injury on chronic kidney disease stage IIIb: Anuric overnight. AKI secondary to acute exacerbation of congestive heart failure in the setting of pericardial effusion.  We will continue CRRT at this time while on Levophed and milrinone-net UF 3.5 L overnight but clinically still with significant volume overload.  I am doubtful that he will have sufficient renal recovery to come off of dialysis.  I will make changes to his CRRT prescription to help rectify hyperkalemia (this raises concern for tissue ischemia in the setting of ongoing CRRT/ultrafiltration).  2.  Acute exacerbation of systolic heart failure: Failed to respond to diuretics and currently ongoing extracorporeal fluid removal with CRRT/UF.  On midodrine 3 times a day along with ongoing levophed and milrinone. 3.  Pericardial effusion: Hemorrhagic and will require additional evaluation for malignancy when stable.  Initial cytology negative. 4.  Hyponatremia: Secondary to CHF exacerbation/acute kidney injury; monitor with CRRT/UF. 5.  Anemia: Secondary to chronic kidney disease and possibly exacerbated by acute illness/CHF decompensation.  Monitor for overt blood loss and will give intravenous iron/ESA.  Subjective:   Reports some shortness of breath with moderate exertion and feeling fatigued.  Labs overnight significant for hyperkalemia.   Objective:   BP (!) 104/56   Pulse (!) 120   Temp (!) 97.4 F (36.3 C) (Axillary)   Resp 18   Ht 6' (1.829 m)   Wt 104.6 kg   SpO2 97%   BMI 31.28 kg/m   Intake/Output Summary (Last 24 hours) at 06/09/2020 0725 Last data filed at 06/09/2020 0700 Gross per 24 hour  Intake 1497.62 ml  Output 5032 ml  Net -3534.38 ml   Weight change:   Physical Exam: Gen: Awake/alert, resting comfortably in bed. CVS: Pulse regular rhythm and normal rate,  distant S1-S2 Resp: Diminished breath sounds over bases, no distinct rales Abd: Soft, obese, nontender Ext: 2-3+ pitting lower extremity edema  Imaging: No results found.  Labs: BMET Recent Labs  Lab 06/06/20 0410 06/06/20 1620 06/07/20 0402 06/07/20 1630 06/08/20 0405 06/08/20 1644 06/09/20 0405  NA 124* 123* 128* 130* 131* 134* 133*  K 5.1 5.2* 5.0 4.8 5.0 5.4* 5.1  CL 91* 90* 96* 97* 98 100 99  CO2 22 22 23 24 25 26 26   GLUCOSE 291* 443* 155* 221* 73 157* 75  BUN 68* 71* 51* 40* 30* 29* 24*  CREATININE 2.93* 3.09* 2.31* 1.92* 1.67* 1.60* 1.47*  CALCIUM 8.5* 8.2* 8.1* 7.9* 8.2* 8.5* 8.4*  PHOS 5.1* 5.9* 3.8 3.0 2.8 2.6 2.5   CBC Recent Labs  Lab 06/06/20 1620 06/07/20 0402 06/08/20 0405 06/09/20 0405  WBC 11.3* 16.8* 14.3* 14.2*  NEUTROABS  --  14.5* 11.8* 11.8*  HGB 10.7* 10.5* 9.6* 10.1*  HCT 33.9* 32.5* 30.3* 31.7*  MCV 96.0 94.5 94.7 95.8  PLT 91* 118* 110* 106*   Medications:    . amiodarone  200 mg Oral BID  . B-complex with vitamin C  1 tablet Oral Daily  . Chlorhexidine Gluconate Cloth  6 each Topical Daily  . Chlorhexidine Gluconate Cloth  6 each Topical Q0600  . dextromethorphan-guaiFENesin  1 tablet Oral BID  . feeding supplement (ENSURE ENLIVE)  237 mL Oral BID BM  . fluticasone  1 spray Each Nare Daily  . folic acid  1 mg Oral Daily  . gabapentin  300 mg  Oral BID  . influenza vaccine adjuvanted  0.5 mL Intramuscular Tomorrow-1000  . insulin aspart  0-20 Units Subcutaneous TID WC  . insulin aspart  12 Units Subcutaneous TID WC  . insulin detemir  30 Units Subcutaneous BID  . mouth rinse  15 mL Mouth Rinse BID  . melatonin  3 mg Oral QHS  . midodrine  10 mg Oral TID WC  . mometasone-formoterol  2 puff Inhalation BID  . primidone  100 mg Oral QHS  . tamsulosin  0.4 mg Oral Daily   [name], MD 06/09/2020, 7:25 AM

## 2020-06-09 NOTE — Progress Notes (Signed)
OT Cancellation Note  Patient Details Name: Robert [name] MRN: [id number] DOB: [birth year]   Cancelled Treatment:    Reason Eval/Treat Not Completed: Fatigue/lethargy limiting ability to participate (attempted x 2)  [name] 06/09/2020, 2:46 PM

## 2020-06-09 NOTE — Progress Notes (Signed)
Nutrition Follow-up  DOCUMENTATION CODES:   Not applicable  INTERVENTION:   - Continue Ensure Enlive po BID, each supplement provides 350 kcal and 20 grams of protein  - Add ProSource Plus 30 ml po BID, each supplement provides 100 kcal and 15 grams of protein  - Double protein portions TID with meals  - Continue B-complex with vitamin C  NUTRITION DIAGNOSIS:   Increased nutrient needs related to acute illness (AKI on CRRT) as evidenced by estimated needs.  Ongoing  GOAL:   Patient will meet greater than or equal to 90% of their needs  Progressing  MONITOR:   PO intake, Supplement acceptance, Weight trends, I & O's, Labs  REASON FOR ASSESSMENT:   Rounds    ASSESSMENT:   Patient with PMH significant for CHF, COPD, CAD, previous COVID 25, GERD, HTN, incarcerated umbilical hernia, bilateral inguinal hernia, DM, ischemic cardiomyopathy, and PAF. Presents this admission with bilateral pulmonary edema.  9/14 - s/p pericardiocentesis  9/17 - start CRRT 9/22 - stop CRRT 9/24 - trial iHD 9/26 - restart CRRT  Pt remains on CRRT, pulling -150/hr. Weight trending back down but pt remains significantly vole overloaded with +4 pitting edema to BLE and +1 pitting edema to BUE. Pt requiring pressor support for CRRT. Per HF note, doubtful that pt will be able to get effective iHD and only hope is for recovery of renal function. Plan is to involve palliative care.  PO intake remains good with most meal completions charted as 100%. Pt accepting about 50% of Ensure Enlive supplements per [facility] documentation.  Meal Completion: 50-100%  Medications reviewed and include: B-complex with vitamin C, Ensure Enlive BID, folic acid, SSI, levemir 30 units BID, ferric gluconate, heparin, milrinone, levophed  Labs reviewed: sodium 133, magnesium 2.6 CBG's: 72-188 x 24 hours  CRRT UF: 5031 ml x 24 hours I/O's: -34.6 L since admit  Diet Order:   Diet Order            Diet Carb Modified  Fluid consistency: Thin; Room service appropriate? Yes; Fluid restriction: 1200 mL Fluid  Diet effective now                 EDUCATION NEEDS:   Education needs have been addressed  Skin:  Skin Assessment: Skin Integrity Issues: DTI: left foot  Last BM:  06/08/20 large type 4  Height:   Ht Readings from Last 1 Encounters:  05/29/20 6' (1.829 m)    Weight:   Wt Readings from Last 1 Encounters:  06/09/20 104.6 kg    BMI:  Body mass index is 31.28 kg/m.  Estimated Nutritional Needs:   Kcal:  2400-2600 kcal  Protein:  120-135 grams  Fluid:  2.0 L/day    Gaynell Face, MS, RD, LDN Inpatient Clinical Dietitian Please see AMiON for contact information.

## 2020-06-10 DIAGNOSIS — J9601 Acute respiratory failure with hypoxia: Secondary | ICD-10-CM | POA: Diagnosis not present

## 2020-06-10 DIAGNOSIS — I5042 Chronic combined systolic (congestive) and diastolic (congestive) heart failure: Secondary | ICD-10-CM | POA: Diagnosis not present

## 2020-06-10 DIAGNOSIS — N183 Chronic kidney disease, stage 3 unspecified: Secondary | ICD-10-CM | POA: Diagnosis not present

## 2020-06-10 DIAGNOSIS — Z66 Do not resuscitate: Secondary | ICD-10-CM

## 2020-06-10 DIAGNOSIS — Z515 Encounter for palliative care: Secondary | ICD-10-CM

## 2020-06-10 DIAGNOSIS — I509 Heart failure, unspecified: Secondary | ICD-10-CM | POA: Diagnosis not present

## 2020-06-10 DIAGNOSIS — I4891 Unspecified atrial fibrillation: Secondary | ICD-10-CM | POA: Diagnosis not present

## 2020-06-10 DIAGNOSIS — I130 Hypertensive heart and chronic kidney disease with heart failure and stage 1 through stage 4 chronic kidney disease, or unspecified chronic kidney disease: Secondary | ICD-10-CM | POA: Diagnosis not present

## 2020-06-10 DIAGNOSIS — E1122 Type 2 diabetes mellitus with diabetic chronic kidney disease: Secondary | ICD-10-CM | POA: Diagnosis not present

## 2020-06-10 LAB — HEPATIC FUNCTION PANEL
ALT: 151 U/L — ABNORMAL HIGH (ref 0–44)
AST: 101 U/L — ABNORMAL HIGH (ref 15–41)
Albumin: 2.6 g/dL — ABNORMAL LOW (ref 3.5–5.0)
Alkaline Phosphatase: 211 U/L — ABNORMAL HIGH (ref 38–126)
Bilirubin, Direct: <0.1 mg/dL (ref 0.0–0.2)
Total Bilirubin: 0.6 mg/dL (ref 0.3–1.2)
Total Protein: 6.3 g/dL — ABNORMAL LOW (ref 6.5–8.1)

## 2020-06-10 LAB — RENAL FUNCTION PANEL
Albumin: 2.6 g/dL — ABNORMAL LOW (ref 3.5–5.0)
Albumin: 2.3 g/dL — ABNORMAL LOW (ref 3.5–5.0)
Anion gap: 7 (ref 5–15)
Anion gap: 7 (ref 5–15)
BUN: 23 mg/dL (ref 8–23)
BUN: 25 mg/dL — ABNORMAL HIGH (ref 8–23)
CO2: 27 mmol/L (ref 22–32)
CO2: 23 mmol/L (ref 22–32)
Calcium: 8.6 mg/dL — ABNORMAL LOW (ref 8.9–10.3)
Calcium: 7.5 mg/dL — ABNORMAL LOW (ref 8.9–10.3)
Chloride: 98 mmol/L (ref 98–111)
Chloride: 103 mmol/L (ref 98–111)
Creatinine, Ser: 1.59 mg/dL — ABNORMAL HIGH (ref 0.61–1.24)
Creatinine, Ser: 1.37 mg/dL — ABNORMAL HIGH (ref 0.61–1.24)
GFR calc Af Amer: 51 mL/min — ABNORMAL LOW (ref >60)
GFR calc Af Amer: >60 mL/min (ref >60)
GFR calc non Af Amer: 44 mL/min — ABNORMAL LOW (ref >60)
GFR calc non Af Amer: 53 mL/min — ABNORMAL LOW (ref >60)
Glucose, Bld: 111 mg/dL — ABNORMAL HIGH (ref 70–99)
Glucose, Bld: 182 mg/dL — ABNORMAL HIGH (ref 70–99)
Phosphorus: 2.7 mg/dL (ref 2.5–4.6)
Phosphorus: 2.4 mg/dL — ABNORMAL LOW (ref 2.5–4.6)
Potassium: 4.7 mmol/L (ref 3.5–5.1)
Potassium: 4 mmol/L (ref 3.5–5.1)
Sodium: 132 mmol/L — ABNORMAL LOW (ref 135–145)
Sodium: 133 mmol/L — ABNORMAL LOW (ref 135–145)

## 2020-06-10 LAB — GLUCOSE, CAPILLARY
Glucose-Capillary: 86 mg/dL (ref 70–99)
Glucose-Capillary: 304 mg/dL — ABNORMAL HIGH (ref 70–99)
Glucose-Capillary: 140 mg/dL — ABNORMAL HIGH (ref 70–99)
Glucose-Capillary: 227 mg/dL — ABNORMAL HIGH (ref 70–99)

## 2020-06-10 LAB — POCT ACTIVATED CLOTTING TIME
Activated Clotting Time: 191 seconds
Activated Clotting Time: 180 seconds
Activated Clotting Time: 207 seconds
Activated Clotting Time: 202 seconds
Activated Clotting Time: 191 seconds
Activated Clotting Time: 191 seconds
Activated Clotting Time: 180 seconds
Activated Clotting Time: 180 seconds

## 2020-06-10 LAB — APTT: aPTT: 114 seconds — ABNORMAL HIGH (ref 24–36)

## 2020-06-10 LAB — CBC
HCT: 31.5 % — ABNORMAL LOW (ref 39.0–52.0)
Hemoglobin: 9.9 g/dL — ABNORMAL LOW (ref 13.0–17.0)
MCH: 30.1 pg (ref 26.0–34.0)
MCHC: 31.4 g/dL (ref 30.0–36.0)
MCV: 95.7 fL (ref 80.0–100.0)
Platelets: 93 10*3/uL — ABNORMAL LOW (ref 150–400)
RBC: 3.29 MIL/uL — ABNORMAL LOW (ref 4.22–5.81)
RDW: 15.2 % (ref 11.5–15.5)
WBC: 11.2 10*3/uL — ABNORMAL HIGH (ref 4.0–10.5)
nRBC: 0 % (ref 0.0–0.2)

## 2020-06-10 LAB — COOXEMETRY PANEL
Carboxyhemoglobin: 1.4 % (ref 0.5–1.5)
Methemoglobin: 0.9 % (ref 0.0–1.5)
O2 Saturation: 65.7 %
Total hemoglobin: 10.5 g/dL — ABNORMAL LOW (ref 12.0–16.0)

## 2020-06-10 LAB — HEPARIN LEVEL (UNFRACTIONATED): Heparin Unfractionated: 0.27 IU/mL — ABNORMAL LOW (ref 0.30–0.70)

## 2020-06-10 LAB — MAGNESIUM: Magnesium: 2.4 mg/dL (ref 1.7–2.4)

## 2020-06-10 NOTE — Progress Notes (Signed)
PROGRESS NOTE  Robert [name]  DOB: 10-11-1950  PCP: Redmond School, MD TTS:[id number]  DOA: 05/14/2020  LOS: 22 days   Chief Complaint  Patient presents with  . Shortness of Breath   Brief narrative: Robert [name] is a 69 y.o. male with PMH of insulin-dependent diabetes mitis, HTN, CAD/MI s/p multiple interventions, chronic combined systolic and diastolic CHF, CKD 3 COPD, peripheral neuropathy, rheumatoid arthritis. Patient presented to the ED on 05/18/2020 with a week history of weight gain, shortness of breath.  He was in respiratory failure.  Chest x-ray showed pulmonary edema. He was admitted for acute exacerbation of CHF. 9/9, echocardiogram showed new systolic diastolic cardiomyopathy with an EF of 35 to 40%.   9/13, repeat echo showed circumferential pericardial effusion and signs of tamponade.  Underwent pericardiocentesis by CT surgery. Patient was continued on maximal dose of IV diuretics with tapering urine output.  His oxygen requirement worsened. Creatinine worsened.  9/17, Chest x-ray showed large right-sided pleural effusion as well as pulmonary vascular congestion.  Critical care and nephrology consultation were obtained.  HD catheter was placed.  Patient was started on CVVHD.  Subjective: Patient was seen and examined this morning. Remains on CRRT. Blood sugar level less than 100 this morning.  Assessment/Plan: Acute hypoxemic respiratory failure -Multifactorial: CHF, pleural effusion, pericardial effusion -Continues to require 4 L oxygen by nasal cannula.  Acute exacerbation chronic combined systolic and diastolic CHF Essential hypertension -EF 35 to 40% -Patient remains volume overloaded.  Currently undergoing CRRT.  Heart failure team following.   AKI on CKD stage IIIb -Likely cardiorenal.  Nephrology following.  Patient is on CRRT.  Creatinine trend as below. Recent Labs    06/05/20 0500 06/06/20 0410 06/06/20 1620 06/07/20 0402 06/07/20 1630  06/08/20 0405 06/08/20 1644 06/09/20 0405 06/09/20 1600 06/10/20 0426  BUN 49* 68* 71* 51* 40* 30* 29* 24* 23 23  CREATININE 2.40* 2.93* 3.09* 2.31* 1.92* 1.67* 1.60* 1.47* 1.71* 1.59*   Bilateral pleural effusions -Improved pleural effusion on chest x-ray repeated on 9/20.  Pericardial effusion w/ tamponade  -status post pericardiocentesis on 9/13  A. fib -On amiodarone.  Remains in A. fib.  Currently off anticoagulation because of hemorrhagic pericarditis. -Not a candidate for DCCV per cardiology.  Hyperkalemia/hyperphosphatemia -Related to renal failure.  Trend as below Recent Labs  Lab 06/07/20 0402 06/07/20 1630 06/08/20 0405 06/08/20 0405 06/08/20 1644 06/09/20 0405 06/09/20 1600 06/09/20 1804 06/10/20 0426  K 5.0   < > 5.0   < > 5.4* 5.1 5.0 4.9 4.7  MG 2.1  --  2.4  --   --  2.6*  --   --  2.4  PHOS 3.8   < > 2.8  --  2.6 2.5 2.9  --  2.7   < > = values in this interval not displayed.   Hyponatremia -Sodium level is fluctuating.  Mostly between 130 and 135. Recent Labs  Lab 06/06/20 0410 06/06/20 1620 06/07/20 0402 06/07/20 1630 06/08/20 0405 06/08/20 1644 06/09/20 0405 06/09/20 1600 06/09/20 1804 06/10/20 0426  NA 124* 123* 128* 130* 131* 134* 133* 130* 133* 132*   Diabetes mellitus 2 with peripheral neuropathy -A1c 7.3 on 9/9 -Blood sugar level is now better on current regimen with Levemir 30 units twice daily, scheduled NovoLog Premeal at 12 units 3 times daily and sliding-scale insulin.  Continue Accu-Cheks -Continue Neurontin 300 mg 3 times daily. -Diabetes care coordinator consult appreciated. Recent Labs  Lab 06/09/20 0826 06/09/20 1112 06/09/20 1608 06/09/20 2156  06/10/20 0656  GLUCAP 188* 82 116* 133* 86   Thrombocytopenia -Platelet level running low but fluctuating.  PF4 antibody sent. No immediate indication of argatroban. -Platelet level is up to 106 today. Recent Labs  Lab 06/03/20 1555 06/04/20 0343 06/05/20 0500  06/06/20 0410 06/06/20 1620 06/07/20 0402 06/08/20 0405 06/09/20 0405 06/10/20 0426  PLT 55* 68* 62* 80* 91* 118* 110* 106* 93*   Elevated liver enzymes -Improving.  Negative acute hepatitis panel. -Repeat liver function panel today. Recent Labs  Lab 06/04/20 1259 06/05/20 0500 06/07/20 0402 06/07/20 1630 06/08/20 0405 06/08/20 1644 06/09/20 0405 06/09/20 1600 06/10/20 0426  AST 31  --  70*  --   --   --   --   --   --   ALT 71*  --  110*  --   --   --   --   --   --   ALKPHOS 135*  --  144*  --   --   --   --   --   --   BILITOT 0.5  --  0.6  --   --   --   --   --   --   PROT 5.2*  --  5.7*  --   --   --   --   --   --   ALBUMIN 2.5*   < > 2.6*  2.5*   < > 2.5* 2.6* 2.6* 2.5* 2.6*   < > = values in this interval not displayed.   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  Pressure injury of skin  - Per documentation of flow sheets  Muscular deconditioning - PT/OT consults  Goals of care -palliative care consultation ordered.  Mobility: PT OT eval. encourage ambulation.   Code Status:   Code Status: Full Code  Nutritional status: Body mass index is 31.78 kg/m. Nutrition Problem: Increased nutrient needs Etiology: acute illness (AKI on CRRT) Signs/Symptoms: estimated needs Diet Order            Diet Carb Modified Fluid consistency: Thin; Room service appropriate? Yes; Fluid restriction: 1200 mL Fluid  Diet effective now                 DVT prophylaxis: heparin 10,000 units/ 20 mL infusion syringe Start: 06/06/20 1745 heparin bolus via infusion syringe 1,000 Units Start: 06/06/20 1741 Place and  maintain sequential compression device Start: 06/03/20 0900 Place TED hose Start: 05/31/20 0933 SCD's Start: 05/20/2020 1942   Antimicrobials:  None Fluid: None Consultants: Nephrology, cardiology Family Communication:  None at bedside  Status is: Inpatient Remains inpatient appropriate because: Patient is on CRRT  Dispo: The patient is from: Home              Anticipated d/c is to: SNF most likely              Anticipated d/c date is: > 3 days              Patient currently is not medically stable to d/c.  Infusions:  . sodium chloride    . sodium chloride    .  ferric gluconate (FERRLECIT/NULECIT) IV 125 mg (06/10/20 1004)  . heparin 10,000 units/ 20 mL infusion syringe 1,400 Units/hr (06/10/20 0941)  . milrinone 0.125 mcg/kg/min (06/10/20 1000)  . norepinephrine (LEVOPHED) Adult infusion 5 mcg/min (06/10/20 1000)  . prismasol BGK 2/2.5 replacement solution 500 mL/hr at 06/10/20 0506  . prismasol BGK 2/2.5 replacement solution 200 mL/hr at 06/10/20 0508  . prismasol BGK 4/2.5 1,500 mL/hr at 06/10/20 1001    Scheduled Meds: . (feeding supplement) PROSource Plus  30 mL Oral BID BM  . amiodarone  200 mg Oral BID  . B-complex with vitamin C  1 tablet Oral Daily  . Chlorhexidine Gluconate Cloth  6 each Topical Daily  . Chlorhexidine Gluconate Cloth  6 each Topical Q0600  . dextromethorphan-guaiFENesin  1 tablet Oral BID  . feeding supplement (ENSURE ENLIVE)  237 mL Oral BID BM  . fluticasone  1 spray Each Nare Daily  . folic acid  1 mg Oral Daily  . gabapentin  300 mg Oral BID  . influenza vaccine adjuvanted  0.5 mL Intramuscular Tomorrow-1000  . insulin aspart  0-20 Units Subcutaneous TID WC  . insulin aspart  12 Units Subcutaneous TID WC  . insulin detemir  30 Units Subcutaneous BID  . mouth rinse  15 mL Mouth Rinse BID  . melatonin  3 mg Oral QHS  . midodrine  15 mg Oral TID WC  . mometasone-formoterol  2 puff Inhalation BID  . primidone  100 mg Oral QHS  . tamsulosin   0.4 mg Oral Daily    Antimicrobials: Anti-infectives (From admission, onward)   None      PRN meds: sodium chloride, sodium chloride, acetaminophen, albuterol, alteplase, heparin, heparin, heparin, heparin, lidocaine (PF), lidocaine-prilocaine, loperamide, Muscle Rub, ondansetron (ZOFRAN) IV, oxyCODONE, pentafluoroprop-tetrafluoroeth, sodium chloride flush   Objective: Vitals:   06/10/20 1000 06/10/20 1009  BP:  (!) 84/55  Pulse: (!) 109 (!) 108  Resp: (!) 33 (!) 26  Temp:    SpO2: 99% 92%    Intake/Output Summary (Last 24 hours) at 06/10/2020 1107 Last data filed at 06/10/2020 1004 Gross per 24 hour  Intake 1207.09 ml  Output 4393 ml  Net -3185.91 ml   Filed Weights   06/07/20 0500 06/09/20 0600 06/10/20 0600  Weight: 108.7 kg 104.6 kg 106.3 kg   Weight change: 1.7 kg Body mass index is 31.78 kg/m.   Physical Exam: General exam: Appears calm and comfortable.  Not in physical distress on CRRT Skin: No rashes, lesions or ulcers. HEENT: Atraumatic, normocephalic, supple neck, no obvious bleeding Lungs: Diminished air entry in bases. Remains on supplemental oxygen. CVS: Rate controlled A. fib  GI/Abd soft, nontender, nondistended, bowel sound present CNS: Alert, awake, oriented to place and person Psychiatry: Mood appropriate Extremities: 2+ pedal edema bilaterally  Data Review: I have personally reviewed the laboratory data and studies available.  Recent Labs  Lab 06/06/20 1620 06/06/20 1620 06/07/20 0402 06/08/20 0405 06/09/20 0405 06/09/20 1804 06/10/20 0426  WBC 11.3*  --  16.8* 14.3* 14.2*  --  11.2*  NEUTROABS  --   --  14.5* 11.8* 11.8*  --   --   HGB 10.7*   < > 10.5* 9.6* 10.1* 10.9* 9.9*  HCT 33.9*   < > 32.5* 30.3* 31.7* 32.0* 31.5*  MCV 96.0  --  94.5 94.7 95.8  --  95.7  PLT 91*  --  118* 110* 106*  --  93*   < > = values in this interval not displayed.  Recent Labs  Lab 06/07/20 0402 06/07/20 1630 06/08/20 0405 06/08/20 0405  06/08/20 1644 06/09/20 0405 06/09/20 1600 06/09/20 1804 06/10/20 0426  NA 128*   < > 131*   < > 134* 133* 130* 133* 132*  K 5.0   < > 5.0   < > 5.4* 5.1 5.0 4.9 4.7  CL 96*   < > 98  --  100 99 97*  --  98  CO2 23   < > 25  --  26 26 26   --  27  GLUCOSE 155*   < > 73  --  157* 75 144*  --  111*  BUN 51*   < > 30*  --  29* 24* 23  --  23  CREATININE 2.31*   < > 1.67*  --  1.60* 1.47* 1.71*  --  1.59*  CALCIUM 8.1*   < > 8.2*  --  8.5* 8.4* 8.3*  --  8.6*  MG 2.1  --  2.4  --   --  2.6*  --   --  2.4  PHOS 3.8   < > 2.8  --  2.6 2.5 2.9  --  2.7   < > = values in this interval not displayed.   F/u labs ordered  Signed, Terrilee Croak, MD [facility] 06/10/2020

## 2020-06-10 NOTE — Progress Notes (Signed)
Patient ID: Robert [name], male   DOB: Nov 07, 1950, 69 y.o.   MRN: [id number] [facility] Progress Note   Assessment/ Plan:   1. Acute kidney Injury on chronic kidney disease stage IIIb: Anuric. AKI secondary to acute exacerbation of congestive heart failure in the setting of pericardial effusion.  Continue CRRT at the current rate of ultrafiltration (net -150 cc/h) for volume unloading.  I am doubtful that he will have sufficient renal recovery to come off of renal replacement therapy.  2.  Acute exacerbation of systolic heart failure: Failed to respond to diuretics and currently ongoing extracorporeal fluid removal with CRRT/UF.  On midodrine 3 times a day along with ongoing levophed and milrinone. 3.  Pericardial effusion: Hemorrhagic and will require additional evaluation for malignancy when stable.  Initial cytology negative. 4.  Hyponatremia: Secondary to CHF exacerbation/acute kidney injury; monitor with CRRT/UF. 5.  Anemia: Secondary to chronic kidney disease and possibly exacerbated by acute illness/CHF decompensation.  Monitor for overt blood loss and will give intravenous iron/ESA.  Subjective:   Without acute events overnight, CRRT filter changed this morning.  Reports some shortness of breath with exertion.   Objective:   BP 93/68   Pulse (!) 115   Temp 98.4 F (36.9 C) (Oral)   Resp 20   Ht 6' (1.829 m)   Wt 106.3 kg   SpO2 98%   BMI 31.78 kg/m   Intake/Output Summary (Last 24 hours) at 06/10/2020 0800 Last data filed at 06/10/2020 0700 Gross per 24 hour  Intake 1325.19 ml  Output 4489 ml  Net -3163.81 ml   Weight change: 1.7 kg  Physical Exam: Gen: Awake/alert, resting comfortably in bed. CVS: Pulse regular rhythm and normal rate, distant S1-S2 Resp: Diminished breath sounds over bases, no distinct rales Abd: Soft, obese, nontender Ext: 2-3+ pitting lower extremity edema  Imaging: No results found.  Labs: BMET Recent Labs  Lab 06/07/20 0402  06/07/20 0402 06/07/20 1630 06/08/20 0405 06/08/20 1644 06/09/20 0405 06/09/20 1600 06/09/20 1804 06/10/20 0426  NA 128*   < > 130* 131* 134* 133* 130* 133* 132*  K 5.0   < > 4.8 5.0 5.4* 5.1 5.0 4.9 4.7  CL 96*  --  97* 98 100 99 97*  --  98  CO2 23  --  24 25 26 26 26   --  27  GLUCOSE 155*  --  221* 73 157* 75 144*  --  111*  BUN 51*  --  40* 30* 29* 24* 23  --  23  CREATININE 2.31*  --  1.92* 1.67* 1.60* 1.47* 1.71*  --  1.59*  CALCIUM 8.1*  --  7.9* 8.2* 8.5* 8.4* 8.3*  --  8.6*  PHOS 3.8  --  3.0 2.8 2.6 2.5 2.9  --  2.7   < > = values in this interval not displayed.   CBC Recent Labs  Lab 06/07/20 0402 06/07/20 0402 06/08/20 0405 06/09/20 0405 06/09/20 1804 06/10/20 0426  WBC 16.8*  --  14.3* 14.2*  --  11.2*  NEUTROABS 14.5*  --  11.8* 11.8*  --   --   HGB 10.5*   < > 9.6* 10.1* 10.9* 9.9*  HCT 32.5*   < > 30.3* 31.7* 32.0* 31.5*  MCV 94.5  --  94.7 95.8  --  95.7  PLT 118*  --  110* 106*  --  93*   < > = values in this interval not displayed.   Medications:    [name] (  feeding supplement) PROSource Plus  30 mL Oral BID BM  . amiodarone  200 mg Oral BID  . B-complex with vitamin C  1 tablet Oral Daily  . Chlorhexidine Gluconate Cloth  6 each Topical Daily  . Chlorhexidine Gluconate Cloth  6 each Topical Q0600  . dextromethorphan-guaiFENesin  1 tablet Oral BID  . feeding supplement (ENSURE ENLIVE)  237 mL Oral BID BM  . fluticasone  1 spray Each Nare Daily  . folic acid  1 mg Oral Daily  . gabapentin  300 mg Oral BID  . influenza vaccine adjuvanted  0.5 mL Intramuscular Tomorrow-1000  . insulin aspart  0-20 Units Subcutaneous TID WC  . insulin aspart  12 Units Subcutaneous TID WC  . insulin detemir  30 Units Subcutaneous BID  . mouth rinse  15 mL Mouth Rinse BID  . melatonin  3 mg Oral QHS  . midodrine  15 mg Oral TID WC  . mometasone-formoterol  2 puff Inhalation BID  . primidone  100 mg Oral QHS  . tamsulosin  0.4 mg Oral Daily   [name], MD 06/10/2020,  8:00 AM

## 2020-06-10 NOTE — Progress Notes (Signed)
Pt in chair having finished his lunch. Participated in seated grooming and UE AROM exercises. Tolerated well with intermittent rest breaks between 10 repetitions of each exercise.    06/10/20 1239  OT Visit Information  Last OT Received On 06/10/20  Assistance Needed +1  History of Present Illness 69 yo admitted with dyspnea, AFib with pericardial effusion with tamponade s/p pericardiocentesis 9/14. Course complicated by AKI on CKD with CRRT (9/17-9/22, restarted 9/26). PMhx: RA, COPD, CHF, CAD, covid, neuropathy, HTN, HLD  Precautions  Precautions Fall;Other (comment)  Precaution Comments CRRT  Pain Assessment  Pain Assessment No/denies pain  Cognition  Arousal/Alertness Awake/alert  Behavior During Therapy WFL for tasks assessed/performed  Overall Cognitive Status Within Functional Limits for tasks assessed  ADL  Grooming Set up;Sitting;Oral care  Bed Mobility  General bed mobility comments pt in chair  Balance  Overall balance assessment Needs assistance  Sitting balance-Leahy Scale Good  Exercises  Exercises General Upper Extremity  General Exercises - Upper Extremity  Shoulder Flexion AROM;Both;10 reps;Seated  Shoulder Horizontal ABduction AROM;Both;10 reps;Seated  Shoulder Horizontal ADduction AROM;Both;10 reps;Seated  Elbow Flexion AROM;Both;10 reps;Seated  Elbow Extension AROM;Both;10 reps;Seated  OT - End of Session  Patient left in chair;with call bell/phone within reach  OT Assessment/Plan  OT Plan Discharge plan remains appropriate  OT Visit Diagnosis Unsteadiness on feet (R26.81);Other abnormalities of gait and mobility (R26.89)  OT Frequency (ACUTE ONLY) Min 2X/week  Follow Up Recommendations Home health OT  OT Equipment None recommended by OT  AM-PAC OT "6 Clicks" Daily Activity Outcome Measure (Version 2)  Help from another person eating meals? 4  Help from another person taking care of personal grooming? 3  Help from another person toileting, which includes  using toliet, bedpan, or urinal? 3  Help from another person bathing (including washing, rinsing, drying)? 3  Help from another person to put on and taking off regular upper body clothing? 3  Help from another person to put on and taking off regular lower body clothing? 3  6 Click Score 19  OT Goal Progression  Progress towards OT goals Not progressing toward goals - comment (ongoing CRRT, decreased activity tolerance)  Acute Rehab OT Goals  Patient Stated Goal get to my sister-in-law's house  OT Goal Formulation With patient  Time For Goal Achievement 06/17/20  Potential to Achieve Goals Fair  OT Time Calculation  OT Start Time (ACUTE ONLY) 1133  OT Stop Time (ACUTE ONLY) 1150  OT Time Calculation (min) 17 min  OT General Charges  $OT Visit 1 Visit  OT Treatments  $Therapeutic Exercise 8-22 mins   Nestor Lewandowsky, OTR/L [place] Pager: [phone] Office: [phone]

## 2020-06-10 NOTE — Progress Notes (Signed)
Orthopedic Tech Progress Note Patient Details:  Robert [name] December 13, 1950 [id number]  Ortho Devices Type of Ortho Device: Louretta Parma boot Ortho Device/Splint Location: BLE Ortho Device/Splint Interventions: Ordered, Application   Post Interventions Patient Tolerated: Well Instructions Provided: Care of device, Poper ambulation with device   [name] 06/10/2020, 3:17 PM

## 2020-06-10 NOTE — Progress Notes (Signed)
Physical Therapy Treatment Patient Details Name: Robert [name] MRN: [id number] DOB: 1950-10-01 Today's Date: 06/10/2020    History of Present Illness 69 yo admitted with dyspnea, AFib with pericardial effusion with tamponade s/p pericardiocentesis 9/14. Course complicated by AKI on CKD with CRRT (9/17-9/22, restarted 9/26). PMhx: RA, COPD, CHF, CAD, covid, neuropathy, HTN, HLD    PT Comments    Pt pleasant and willing to mobilize with report of fatigue and SOB with activity needing rest breaks between transitional movements and HEP. Pt educated for importance of increased activity and transfers particularly on CRRT as noted weakness from last week when he was on CRRT holiday and able to walk in halls. Pt agreeable to continued effort with therapy and nursing.  HR 108-111 SpO2 92-98% on 3L    Follow Up Recommendations  Home health PT;Supervision/Assistance - 24 hour     Equipment Recommendations  None recommended by PT    Recommendations for Other Services       Precautions / Restrictions Precautions Precautions: Fall;Other (comment) Precaution Comments: CRRT    Mobility  Bed Mobility Overal bed mobility: Needs Assistance Bed Mobility: Supine to Sit     Supine to sit: HOB elevated;Min assist     General bed mobility comments: min assist to move legs toward EOB then pt able to complete swinging legs off of bed and elevating trunk from HOB 25 degrees. Increased time  Transfers Overall transfer level: Needs assistance   Transfers: Sit to/from Stand;Stand Pivot Transfers Sit to Stand: Mod assist;Min assist         General transfer comment: initial stand from bed with mod assist to rise with cues for hand placement and sequence, pt pivoted bed to recliner with RW with minassist. 2nd trial standing from recliner with min assist and cues with significant rest needed between trials  Ambulation/Gait             General Gait Details: pt declined side stepping and  unable to peroform standing marching as he could not lift LLE   Stairs             Wheelchair Mobility    Modified Rankin (Stroke Patients Only)       Balance Overall balance assessment: Needs assistance   Sitting balance-Leahy Scale: Good       Standing balance-Leahy Scale: Poor Standing balance comment: bil UE support on RW                            Cognition Arousal/Alertness: Awake/alert Behavior During Therapy: WFL for tasks assessed/performed Overall Cognitive Status: Within Functional Limits for tasks assessed                                        Exercises General Exercises - Lower Extremity Long Arc Quad: AROM;Both;Seated;20 reps Hip Flexion/Marching: AROM;Both;Seated;20 reps    General Comments        Pertinent Vitals/Pain Pain Assessment: 0-10 Pain Score: 3  Pain Location: LLE Pain Descriptors / Indicators: Aching Pain Intervention(s): Limited activity within patient's tolerance;Repositioned    Home Living                      Prior Function            PT Goals (current goals can now be found in the care plan section) Progress towards PT  goals: Progressing toward goals    Frequency    Min 3X/week      PT Plan Current plan remains appropriate    Co-evaluation              AM-PAC PT "6 Clicks" Mobility   Outcome Measure  Help needed turning from your back to your side while in a flat bed without using bedrails?: A Little Help needed moving from lying on your back to sitting on the side of a flat bed without using bedrails?: A Little Help needed moving to and from a bed to a chair (including a wheelchair)?: A Little Help needed standing up from a chair using your arms (e.g., wheelchair or bedside chair)?: A Lot Help needed to walk in hospital room?: A Lot Help needed climbing 3-5 steps with a railing? : Total 6 Click Score: 14    End of Session Equipment Utilized During Treatment:  Gait belt Activity Tolerance: Patient tolerated treatment well Patient left: in chair;with call bell/phone within reach;with nursing/sitter in room Nurse Communication: Mobility status PT Visit Diagnosis: Other abnormalities of gait and mobility (R26.89);Difficulty in walking, not elsewhere classified (R26.2)     Time: 3606-7703 PT Time Calculation (min) (ACUTE ONLY): 41 min  Charges:  $Therapeutic Exercise: 8-22 mins $Therapeutic Activity: 8-22 mins                     Isa Kohlenberg P, PT Acute Rehabilitation Services Pager: [phone] Office: [phone]    Sandy Salaam Esgar Barnick 06/10/2020, 12:32 PM

## 2020-06-10 NOTE — Progress Notes (Signed)
Advanced Heart Failure Rounding Note  PCP-Cardiologist: Sanda Klein, MD   Subjective:    CVVHD restarted on 9/26. Continues to pull at -150/hr. Circuit changed this am.   Weight recorded as up 4 pounds?  230 -> 234. (dry weight this admit 213-218)  Minimal urine output but says he feels like he has to pee and bladder scan showed 325cc  On norepi 5 mcg + milrinone 0.125 mcg. CO-OX 66%.    WBC 11>16>14> 14> 11.2  Feels weak. Remains SOB and fatigued. + bloated   Objective:   Weight Range: 106.3 kg Body mass index is 31.78 kg/m.   Vital Signs:   Temp:  [98.2 F (36.8 C)-98.4 F (36.9 C)] 98.3 F (36.8 C) (09/30 0815) Pulse Rate:  [80-118] 110 (09/30 0800) Resp:  [16-29] 29 (09/30 0800) BP: (82-116)/(53-69) 96/61 (09/30 0800) SpO2:  [94 %-100 %] 97 % (09/30 0831) Weight:  [106.3 kg] 106.3 kg (09/30 0600) Last BM Date: 06/06/20  Weight change: Filed Weights   06/07/20 0500 06/09/20 0600 06/10/20 0600  Weight: 108.7 kg 104.6 kg 106.3 kg    Intake/Output:   Intake/Output Summary (Last 24 hours) at 06/10/2020 0929 Last data filed at 06/10/2020 0900 Gross per 24 hour  Intake 1191.41 ml  Output 4205 ml  Net -3013.59 ml      Physical Exam    General:  Lying in bed. weak appearing. No resp difficulty HEENT: normal Neck: supple. JVP to jaw  Carotids 2+ bilat; no bruits. No lymphadenopathy or thryomegaly appreciated. Cor: PMI nondisplaced. Irregular rate & rhythm. 2/6 TR Lungs: + wheeze Abdomen: soft, nontender, ++ distended. No hepatosplenomegaly. No bruits or masses. Good bowel sounds. Extremities: no cyanosis, clubbing, rash, 3+ edema Neuro: alert & orientedx3, cranial nerves grossly intact. moves all 4 extremities w/o difficulty. Affect pleasant   Telemetry    A fib 90-110s Personally reviewed  Labs    CBC Recent Labs    06/08/20 0405 06/08/20 0405 06/09/20 0405 06/09/20 0405 06/09/20 1804 06/10/20 0426  WBC 14.3*   < > 14.2*  --   --   11.2*  NEUTROABS 11.8*  --  11.8*  --   --   --   HGB 9.6*   < > 10.1*   < > 10.9* 9.9*  HCT 30.3*   < > 31.7*   < > 32.0* 31.5*  MCV 94.7   < > 95.8  --   --  95.7  PLT 110*   < > 106*  --   --  93*   < > = values in this interval not displayed.   Basic Metabolic Panel Recent Labs    06/08/20 0405 06/08/20 1644 06/09/20 0405 06/09/20 0405 06/09/20 1600 06/09/20 1600 06/09/20 1804 06/10/20 0426  NA 131*   < > 133*   < > 130*   < > 133* 132*  K 5.0   < > 5.1   < > 5.0   < > 4.9 4.7  CL 98   < > 99   < > 97*  --   --  98  CO2 25   < > 26   < > 26  --   --  27  GLUCOSE 73   < > 75   < > 144*  --   --  111*  BUN 30*   < > 24*   < > 23  --   --  23  CREATININE 1.67*   < > 1.47*   < >  1.71*  --   --  1.59*  CALCIUM 8.2*   < > 8.4*   < > 8.3*  --   --  8.6*  MG 2.4  --  2.6*  --   --   --   --   --   PHOS 2.8   < > 2.5   < > 2.9  --   --  2.7   < > = values in this interval not displayed.   Liver Function Tests Recent Labs    06/09/20 1600 06/10/20 0426  ALBUMIN 2.5* 2.6*   No results for input(s): LIPASE, AMYLASE in the last 72 hours. Cardiac Enzymes No results for input(s): CKTOTAL, CKMB, CKMBINDEX, TROPONINI in the last 72 hours.  BNP: BNP (last 3 results) Recent Labs    07/03/19 2313 05/16/20 1228 06/10/2020 1310  BNP 346.0* 149.0* 157.0*    ProBNP (last 3 results) No results for input(s): PROBNP in the last 8760 hours.   D-Dimer No results for input(s): DDIMER in the last 72 hours. Hemoglobin A1C No results for input(s): HGBA1C in the last 72 hours. Fasting Lipid Panel No results for input(s): CHOL, HDL, LDLCALC, TRIG, CHOLHDL, LDLDIRECT in the last 72 hours. Thyroid Function Tests No results for input(s): TSH, T4TOTAL, T3FREE, THYROIDAB in the last 72 hours.  Invalid input(s): FREET3  Other results:   Imaging    No results found.   Medications:     Scheduled Medications:  (feeding supplement) PROSource Plus  30 mL Oral BID BM    amiodarone  200 mg Oral BID   B-complex with vitamin C  1 tablet Oral Daily   Chlorhexidine Gluconate Cloth  6 each Topical Daily   Chlorhexidine Gluconate Cloth  6 each Topical Q0600   dextromethorphan-guaiFENesin  1 tablet Oral BID   feeding supplement (ENSURE ENLIVE)  237 mL Oral BID BM   fluticasone  1 spray Each Nare Daily   folic acid  1 mg Oral Daily   gabapentin  300 mg Oral BID   influenza vaccine adjuvanted  0.5 mL Intramuscular Tomorrow-1000   insulin aspart  0-20 Units Subcutaneous TID WC   insulin aspart  12 Units Subcutaneous TID WC   insulin detemir  30 Units Subcutaneous BID   mouth rinse  15 mL Mouth Rinse BID   melatonin  3 mg Oral QHS   midodrine  15 mg Oral TID WC   mometasone-formoterol  2 puff Inhalation BID   primidone  100 mg Oral QHS   tamsulosin  0.4 mg Oral Daily    Infusions:  sodium chloride     sodium chloride     ferric gluconate (FERRLECIT/NULECIT) IV Stopped (06/09/20 0902)   heparin 10,000 units/ 20 mL infusion syringe 1,350 Units/hr (06/10/20 0517)   milrinone 0.125 mcg/kg/min (06/10/20 0700)   norepinephrine (LEVOPHED) Adult infusion 5 mcg/min (06/10/20 0700)   prismasol BGK 2/2.5 replacement solution 500 mL/hr at 06/10/20 0506   prismasol BGK 2/2.5 replacement solution 200 mL/hr at 06/10/20 0508   prismasol BGK 4/2.5 1,500 mL/hr at 06/10/20 0512    PRN Medications: sodium chloride, sodium chloride, acetaminophen, albuterol, alteplase, heparin, heparin, heparin, heparin, lidocaine (PF), lidocaine-prilocaine, loperamide, Muscle Rub, ondansetron (ZOFRAN) IV, oxyCODONE, pentafluoroprop-tetrafluoroeth, sodium chloride flush    Patient Profile   Mr. 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 Assessment/Plan   1. Acute on chronic systolic HF due to iCM - Echo  05/2019 with EF 50-55%, - Echo 9/21 EF 35-40% with moderate to large  pericardial effusion - Massively volume overloaded this admit and required CVVHD - Milrinone started on 9/25 for co-ox 53%. NE added to support CVVHD. Co-ox now 69% - Volume status improved with CVVHD (263 -> 213) then trended back upto 240 with iHD - CVVHD restarted on 9/26. Continues with good UF but requiring NE and milrinone to support. Weight reported as up 4 pounds this am. Will recheck. Stressed to care team need to get him 2-3L negative at least today.  - No ACE/ARB, spiro due to low BP and AKI - Off bb with low CO-OX  - He appears unable to tolerate even CVVHD without inotrope support (though this may be confounded by recent sepsis). At this point, I doubt he will be able to get effective iHD. (D/w Renal who agrees) - Only hope for survival seems to be recovery of renal function which I am not optimistic about that at all. Have consulted palliative care who will see today. Will pull as much fluid as possible on CVVHD. Then attempt to get back on iHD and wean inotropes (or wait for recovery). If fails, then only option would be Palliative Care.  - Focus on volume removal today. Maintain inotropes/pressors.  - Check in/out bleed cath - UNNA boots  2. Pericardial effusion. hemorrhagic - s/p tap this admit with 1L bloody fluid - cytology negative - echo 05/31/20: no recurrence - unclear etiology. will need non-contrast chest CT at some point to exclude malignancy - No change  3. AKI - likely cardiorenal/ATN - CVVHD 9/17-9/22  Weight 263-> 213 (after CVVHD stopped weight 230 -> 240) - iHD 9/24 with only 1.3L off - Restarted CVVHD 9/26 - Good UF. Remains overloaded -> continue to pull. Support with NE and midodrine 15. Milrinone down to 0.125. Can likely stop milrinone soon  - See plan abvoe  4. Acute on chronic hypoxic respiratory failure - predominantly due to volume overload but also has underlying COPD - need more fluid off today - on 4L HF  5. CAD - No s/s ischemia  ischemia - continue ASA/statin   6. P AFL/Fib  - now on po amio.  - Remains in Afib.  - not candidate for DC-CV currently as AC off due to hemorrhagic pericardial effusion   7. LE wounds  - venous stasis with PAD as well   8. ID  - WBC stable. Afebrile - Cultures NGTD  CRITICAL CARE Performed by: Glori Bickers  Total critical care time: 35 minutes  Critical care time was exclusive of separately billable procedures and treating other patients.  Critical care was necessary to treat or prevent imminent or life-threatening deterioration.  Critical care was time spent personally by me (independent of midlevel providers or residents) on the following activities: development of treatment plan with patient and/or surrogate as well as nursing, discussions with consultants, evaluation of patient's response to treatment, examination of patient, obtaining history from patient or surrogate, ordering and performing treatments and interventions, ordering and review of laboratory studies, ordering and review of radiographic studies, pulse oximetry and re-evaluation of patient's condition.    Length of Stay: [place], MD  06/10/2020, 9:29 AM  Advanced Heart Failure Team Pager [phone] (M-F; 7a - 4p)  Please contact [facility] for night-coverage after hours (4p -7a ) and weekends on [email or web address]

## 2020-06-11 LAB — POCT ACTIVATED CLOTTING TIME
Activated Clotting Time: 153 seconds
Activated Clotting Time: 169 seconds
Activated Clotting Time: 169 seconds
Activated Clotting Time: 169 seconds
Activated Clotting Time: 180 seconds
Activated Clotting Time: 180 seconds
Activated Clotting Time: 180 seconds
Activated Clotting Time: 186 seconds
Activated Clotting Time: 186 seconds
Activated Clotting Time: 186 seconds
Activated Clotting Time: 186 seconds
Activated Clotting Time: 186 seconds
Activated Clotting Time: 191 seconds
Activated Clotting Time: 213 seconds

## 2020-06-11 LAB — CBC
HCT: 32.1 % — ABNORMAL LOW (ref 39.0–52.0)
Hemoglobin: 10.1 g/dL — ABNORMAL LOW (ref 13.0–17.0)
MCH: 30.1 pg (ref 26.0–34.0)
MCHC: 31.5 g/dL (ref 30.0–36.0)
MCV: 95.8 fL (ref 80.0–100.0)
Platelets: 106 10*3/uL — ABNORMAL LOW (ref 150–400)
RBC: 3.35 MIL/uL — ABNORMAL LOW (ref 4.22–5.81)
RDW: 15.5 % (ref 11.5–15.5)
WBC: 11.8 10*3/uL — ABNORMAL HIGH (ref 4.0–10.5)
nRBC: 0 % (ref 0.0–0.2)

## 2020-06-11 LAB — RENAL FUNCTION PANEL
Albumin: 2.6 g/dL — ABNORMAL LOW (ref 3.5–5.0)
Albumin: 2.6 g/dL — ABNORMAL LOW (ref 3.5–5.0)
Anion gap: 7 (ref 5–15)
Anion gap: 8 (ref 5–15)
BUN: 26 mg/dL — ABNORMAL HIGH (ref 8–23)
BUN: 29 mg/dL — ABNORMAL HIGH (ref 8–23)
CO2: 25 mmol/L (ref 22–32)
CO2: 25 mmol/L (ref 22–32)
Calcium: 8.4 mg/dL — ABNORMAL LOW (ref 8.9–10.3)
Calcium: 8.7 mg/dL — ABNORMAL LOW (ref 8.9–10.3)
Chloride: 98 mmol/L (ref 98–111)
Chloride: 99 mmol/L (ref 98–111)
Creatinine, Ser: 1.54 mg/dL — ABNORMAL HIGH (ref 0.61–1.24)
Creatinine, Ser: 1.58 mg/dL — ABNORMAL HIGH (ref 0.61–1.24)
GFR calc Af Amer: 53 mL/min — ABNORMAL LOW (ref >60)
GFR calc Af Amer: 52 mL/min — ABNORMAL LOW (ref >60)
GFR calc non Af Amer: 46 mL/min — ABNORMAL LOW (ref >60)
GFR calc non Af Amer: 45 mL/min — ABNORMAL LOW (ref >60)
Glucose, Bld: 181 mg/dL — ABNORMAL HIGH (ref 70–99)
Glucose, Bld: 210 mg/dL — ABNORMAL HIGH (ref 70–99)
Phosphorus: 2.6 mg/dL (ref 2.5–4.6)
Phosphorus: 2.8 mg/dL (ref 2.5–4.6)
Potassium: 4.9 mmol/L (ref 3.5–5.1)
Potassium: 4.7 mmol/L (ref 3.5–5.1)
Sodium: 130 mmol/L — ABNORMAL LOW (ref 135–145)
Sodium: 132 mmol/L — ABNORMAL LOW (ref 135–145)

## 2020-06-11 LAB — HEPARIN LEVEL (UNFRACTIONATED)
Heparin Unfractionated: 0.18 IU/mL — ABNORMAL LOW (ref 0.30–0.70)
Heparin Unfractionated: 0.19 IU/mL — ABNORMAL LOW (ref 0.30–0.70)

## 2020-06-11 LAB — GLUCOSE, CAPILLARY
Glucose-Capillary: 139 mg/dL — ABNORMAL HIGH (ref 70–99)
Glucose-Capillary: 164 mg/dL — ABNORMAL HIGH (ref 70–99)
Glucose-Capillary: 203 mg/dL — ABNORMAL HIGH (ref 70–99)
Glucose-Capillary: 119 mg/dL — ABNORMAL HIGH (ref 70–99)

## 2020-06-11 LAB — COOXEMETRY PANEL
Carboxyhemoglobin: 1.4 % (ref 0.5–1.5)
Methemoglobin: 0.9 % (ref 0.0–1.5)
O2 Saturation: 65.1 %
Total hemoglobin: 10.6 g/dL — ABNORMAL LOW (ref 12.0–16.0)

## 2020-06-11 LAB — APTT: aPTT: 69 seconds — ABNORMAL HIGH (ref 24–36)

## 2020-06-11 LAB — MAGNESIUM: Magnesium: 2.4 mg/dL (ref 1.7–2.4)

## 2020-06-11 NOTE — Progress Notes (Addendum)
Advanced Heart Failure Rounding Note  PCP-Cardiologist: Sanda Klein, MD   Subjective:    On milrinone 0.125 + NE 7. Co-ox 65%.   Continues on CVVH. - 4.3L fluid removal yesterday. He made 550 cc in Urine yesterday (needed to have I/o cath). Wt down an additional 12 lb. CVP 11 today. Currently pulling 220 cc/hr but BP soft. Also getting 15 midodrine tid.   WBC 11>16>14> 14> 11.2>>11.8  Sitting up in bed. Feels "ok". No respiratory difficulty.    Objective:   Weight Range: 100.8 kg Body mass index is 30.14 kg/m.   Vital Signs:   Temp:  [97.6 F (36.4 C)-98.3 F (36.8 C)] 97.8 F (36.6 C) (10/01 0658) Pulse Rate:  [70-115] 93 (10/01 0700) Resp:  [14-33] 20 (10/01 0700) BP: (71-121)/(48-77) 99/59 (10/01 0700) SpO2:  [84 %-99 %] 93 % (10/01 0700) Weight:  [100.8 kg] 100.8 kg (10/01 0700) Last BM Date: 06/10/20  Weight change: Filed Weights   06/09/20 0600 06/10/20 0600 06/11/20 0700  Weight: 104.6 kg 106.3 kg 100.8 kg    Intake/Output:   Intake/Output Summary (Last 24 hours) at 06/11/2020 0806 Last data filed at 06/11/2020 0600 Gross per 24 hour  Intake 1060.81 ml  Output 4671 ml  Net -3610.19 ml      Physical Exam    CVP 11  General:  Fatigue appearing. Sitting up in bed. No resp difficulty HEENT: normal Neck: supple. JVP elevated to  jaw  Carotids 2+ bilat; no bruits. No lymphadenopathy or thryomegaly appreciated.  Cor: PMI nondisplaced. Irregular rate & rhythm. 2/6 TR Lungs: faint inspiratory wheezing anteriorly LLL Abdomen: soft, nontender, ++ distended. No hepatosplenomegaly. No bruits or masses. Good bowel sounds. Extremities: no cyanosis, clubbing, rash, 3+ bilateral LE edema + bilateral unna boots  Neuro: alert & orientedx3, cranial nerves grossly intact. moves all 4 extremities w/o difficulty. Affect pleasant   Telemetry    A fib 90- low 100s Personally reviewed  Labs    CBC Recent Labs    06/09/20 0405 06/09/20 1804 06/10/20 0426  06/11/20 0318  WBC 14.2*  --  11.2* 11.8*  NEUTROABS 11.8*  --   --   --   HGB 10.1*   < > 9.9* 10.1*  HCT 31.7*   < > 31.5* 32.1*  MCV 95.8  --  95.7 95.8  PLT 106*  --  93* 106*   < > = values in this interval not displayed.   Basic Metabolic Panel Recent Labs    06/10/20 0426 06/10/20 0426 06/10/20 1600 06/11/20 0318  NA 132*   < > 133* 130*  K 4.7   < > 4.0 4.9  CL 98   < > 103 98  CO2 27   < > 23 25  GLUCOSE 111*   < > 182* 181*  BUN 23   < > 25* 26*  CREATININE 1.59*   < > 1.37* 1.54*  CALCIUM 8.6*   < > 7.5* 8.4*  MG 2.4  --   --  2.4  PHOS 2.7   < > 2.4* 2.6   < > = values in this interval not displayed.   Liver Function Tests Recent Labs    06/10/20 1200 06/10/20 1200 06/10/20 1600 06/11/20 0318  AST 101*  --   --   --   ALT 151*  --   --   --   ALKPHOS 211*  --   --   --   BILITOT 0.6  --   --   --  PROT 6.3*  --   --   --   ALBUMIN 2.6*   < > 2.3* 2.6*   < > = values in this interval not displayed.   No results for input(s): LIPASE, AMYLASE in the last 72 hours. Cardiac Enzymes No results for input(s): CKTOTAL, CKMB, CKMBINDEX, TROPONINI in the last 72 hours.  BNP: BNP (last 3 results) Recent Labs    07/03/19 2313 05/16/20 1228 06/07/2020 1310  BNP 346.0* 149.0* 157.0*    ProBNP (last 3 results) No results for input(s): PROBNP in the last 8760 hours.   D-Dimer No results for input(s): DDIMER in the last 72 hours. Hemoglobin A1C No results for input(s): HGBA1C in the last 72 hours. Fasting Lipid Panel No results for input(s): CHOL, HDL, LDLCALC, TRIG, CHOLHDL, LDLDIRECT in the last 72 hours. Thyroid Function Tests No results for input(s): TSH, T4TOTAL, T3FREE, THYROIDAB in the last 72 hours.  Invalid input(s): FREET3  Other results:   Imaging    No results found.   Medications:     Scheduled Medications: . (feeding supplement) PROSource Plus  30 mL Oral BID BM  . amiodarone  200 mg Oral BID  . B-complex with vitamin C  1  tablet Oral Daily  . Chlorhexidine Gluconate Cloth  6 each Topical Daily  . Chlorhexidine Gluconate Cloth  6 each Topical Q0600  . dextromethorphan-guaiFENesin  1 tablet Oral BID  . feeding supplement (ENSURE ENLIVE)  237 mL Oral BID BM  . fluticasone  1 spray Each Nare Daily  . folic acid  1 mg Oral Daily  . gabapentin  300 mg Oral BID  . influenza vaccine adjuvanted  0.5 mL Intramuscular Tomorrow-1000  . insulin aspart  0-20 Units Subcutaneous TID WC  . insulin aspart  12 Units Subcutaneous TID WC  . insulin detemir  30 Units Subcutaneous BID  . mouth rinse  15 mL Mouth Rinse BID  . melatonin  3 mg Oral QHS  . midodrine  15 mg Oral TID WC  . mometasone-formoterol  2 puff Inhalation BID  . primidone  100 mg Oral QHS  . tamsulosin  0.4 mg Oral Daily    Infusions: . sodium chloride    . sodium chloride    . ferric gluconate (FERRLECIT/NULECIT) IV Stopped (06/10/20 1108)  . heparin 10,000 units/ 20 mL infusion syringe 1,700 Units/hr (06/11/20 0509)  . milrinone 0.125 mcg/kg/min (06/11/20 0600)  . norepinephrine (LEVOPHED) Adult infusion 7 mcg/min (06/11/20 0600)  . prismasol BGK 2/2.5 replacement solution 500 mL/hr at 06/11/20 0312  . prismasol BGK 2/2.5 replacement solution 200 mL/hr at 06/10/20 0508  . prismasol BGK 4/2.5 1,500 mL/hr at 06/11/20 0713    PRN Medications: sodium chloride, sodium chloride, acetaminophen, albuterol, alteplase, heparin, heparin, heparin, heparin, lidocaine (PF), lidocaine-prilocaine, loperamide, Muscle Rub, ondansetron (ZOFRAN) IV, oxyCODONE, pentafluoroprop-tetrafluoroeth, sodium chloride flush    Patient Profile   Mr. 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  Assessment/Plan   1. Acute on chronic systolic HF due to iCM - Echo 05/2019 with EF 50-55%, - Echo 9/21 EF 35-40% with moderate to large pericardial effusion - Massively volume overloaded this  admit and required CVVHD - Milrinone started on 9/25 for co-ox 53%. NE added to support CVVHD. Co-ox now 65% - Volume status improved with CVVHD (263 -> 213) then trended back upto 240 with iHD - CVVHD restarted on 9/26. Continues with good UF but  requiring NE and milrinone to support.  - Currently pulling 220 cc/hr but BP soft. Titrate NE as needed to allow for aggressive fluid removal - Continue midodrine at 15 tid  - No ACE/ARB, spiro due to low BP and AKI - Off bb with low CO-OX  - He appears unable to tolerate even CVVHD without inotrope support (though this may be confounded by recent sepsis). At this point, I doubt he will be able to get effective iHD. (D/w Renal who agrees) - Only hope for survival seems to be recovery of renal function which I am not optimistic about that at all. Palliative care now following. Will pull as much fluid as possible on CVVHD. Then attempt to get back on iHD and wean inotropes (or wait for recovery). If fails, then only option would be Palliative Care.  - Focus on volume removal today. Maintain inotropes/pressors.  - Continue UNNA boots  2. Pericardial effusion. hemorrhagic - s/p tap this admit with 1L bloody fluid - cytology negative - echo 05/31/20: no recurrence - unclear etiology. will need non-contrast chest CT at some point to exclude malignancy - No change  3. AKI - likely cardiorenal/ATN - CVVHD 9/17-9/22  Weight 263-> 213 (after CVVHD stopped weight 230 -> 240) - iHD 9/24 with only 1.3L off - Restarted CVVHD 9/26 - Good UF. Remains overloaded -> continue to pull. Support with NE and midodrine 15. Milrinone down to 0.125.   - See plan abvoe  4. Acute on chronic hypoxic respiratory failure - predominantly due to volume overload but also has underlying COPD - need more fluid off today - on 4L HF  5. CAD - No s/s ischemia ischemia - continue ASA/statin   6. P AFL/Fib  - now on po amio.  - Remains in Afib.  - not candidate for DC-CV  currently as AC off due to hemorrhagic pericardial effusion   7. LE wounds  - venous stasis with PAD as well   8. ID  - WBC stable. Afebrile - Cultures NGTD  Length of Stay: [address] PA-C  06/11/2020, 8:06 AM  Advanced Heart Failure Team Pager [phone] (M-F; 7a - 4p)  Please contact [facility] for night-coverage after hours (4p -7a ) and weekends on [email or web address]  Agree with above.  Remains on CVVHD pulling ~220/hr. Weight coming down. Breathing getting better but still not baseline. Remains in AF. Rate better controlled.  Remains on NE and milrinone + midodrine 15 tid  General:  Weak appearing. No resp difficulty HEENT: normal Neck: supple. JVP to jaw Carotids 2+ bilat; no bruits. No lymphadenopathy or thryomegaly appreciated. Cor: PMI nondisplaced. Irreg 2/6 TR Lungs: clear Abdomen: soft, nontender, nondistended. No hepatosplenomegaly. No bruits or masses. Good bowel sounds. Extremities: no cyanosis, clubbing, rash, 2+ edema Neuro: alert & orientedx3, cranial nerves grossly intact. moves all 4 extremities w/o difficulty. Affect pleasant  Remains very tenuous. Anuric. Still volume overloaded. Will continue CVVHD with pressor support. Can stop milrinone and continue NE and midodrine. I d/w Dr. Posey Pronto and we both agree that he will be unlikely to tolerate iHD but will continue to push forward for now. Plan CVVHD over the weekend and then stop and re-attempts iHD. If fails (and renal function not recovering) comfort care may be only option. Palliative Care team following.   CRITICAL CARE Performed by: Glori Bickers  Total critical care time: 35 minutes  Critical care time was exclusive of separately billable procedures and treating other patients.  Critical care was  necessary to treat or prevent imminent or life-threatening deterioration.  Critical care was time spent personally by me (independent of midlevel providers or residents) on the following  activities: development of treatment plan with patient and/or surrogate as well as nursing, discussions with consultants, evaluation of patient's response to treatment, examination of patient, obtaining history from patient or surrogate, ordering and performing treatments and interventions, ordering and review of laboratory studies, ordering and review of radiographic studies, pulse oximetry and re-evaluation of patient's condition.  Glori Bickers, MD  7:07 PM

## 2020-06-11 NOTE — Progress Notes (Signed)
Patient ID: Robert [name], male   DOB: 1951-01-12, 69 y.o.   MRN: [id number]  [facility] Progress Note   Assessment/ Plan:   1. Acute kidney Injury on chronic kidney disease stage IIIb: Anuric. AKI secondary to acute exacerbation of congestive heart failure in the setting of pericardial effusion.  Continue CRRT at this time for efforts at volume unloading-we had a brief discussion regarding his trajectory of care and will discuss with Dr. Haroldine Laws about timing of stopping CRRT leading up to family conference on Monday to reevaluate goals of care.  I suspect that we can stop CRRT on Sunday if respiratory status/CVP allows and attempt HD on Monday morning to allow for a more fruitful discussion on Monday afternoon.  2.  Acute exacerbation of systolic heart failure: Failed to respond to diuretics and currently ongoing extracorporeal fluid removal with CRRT/UF.  On low-dose Levophed and oral midodrine. 3.  Pericardial effusion: Hemorrhagic and will require additional evaluation for malignancy when stable.  Initial cytology negative. 4.  Hyponatremia: Secondary to CHF exacerbation/acute kidney injury; continue to monitor with CRRT/UF.  On fluid restriction 1.2 L/day. 5.  Anemia: Secondary to chronic kidney disease and possibly exacerbated by acute illness/CHF decompensation.  Monitor for overt blood loss and will give intravenous iron/ESA.  Subjective:   Continues to tolerate CRRT with net ultrafiltration of up to 220 cc/h on fixed dose Levophed 7 mics per hour.   Objective:   BP (!) 86/66   Pulse 90   Temp 97.8 F (36.6 C) (Oral)   Resp 20   Ht 6' (1.829 m)   Wt 100.8 kg   SpO2 95%   BMI 30.14 kg/m   Intake/Output Summary (Last 24 hours) at 06/11/2020 0725 Last data filed at 06/11/2020 0600 Gross per 24 hour  Intake 1069.42 ml  Output 4815 ml  Net -3745.58 ml   Weight change: -5.511 kg  Physical Exam: Gen: Awake/alert, resting comfortably in bed.  Nurses at bedside. CVS:  Pulse regular rhythm and normal rate, distant S1-S2 Resp: Diminished breath sounds over bases, no distinct rales Abd: Soft, obese, nontender Ext: 2-3+ pitting lower extremity edema  Imaging: No results found.  Labs: BMET Recent Labs  Lab 06/08/20 0405 06/08/20 0405 06/08/20 1644 06/09/20 0405 06/09/20 1600 06/09/20 1804 06/10/20 0426 06/10/20 1600 06/11/20 0318  NA 131*   < > 134* 133* 130* 133* 132* 133* 130*  K 5.0   < > 5.4* 5.1 5.0 4.9 4.7 4.0 4.9  CL 98  --  100 99 97*  --  98 103 98  CO2 25  --  26 26 26   --  27 23 25   GLUCOSE 73  --  157* 75 144*  --  111* 182* 181*  BUN 30*  --  29* 24* 23  --  23 25* 26*  CREATININE 1.67*  --  1.60* 1.47* 1.71*  --  1.59* 1.37* 1.54*  CALCIUM 8.2*  --  8.5* 8.4* 8.3*  --  8.6* 7.5* 8.4*  PHOS 2.8  --  2.6 2.5 2.9  --  2.7 2.4* 2.6   < > = values in this interval not displayed.   CBC Recent Labs  Lab 06/07/20 0402 06/07/20 0402 06/08/20 0405 06/08/20 0405 06/09/20 0405 06/09/20 1804 06/10/20 0426 06/11/20 0318  WBC 16.8*   < > 14.3*  --  14.2*  --  11.2* 11.8*  NEUTROABS 14.5*  --  11.8*  --  11.8*  --   --   --  HGB 10.5*   < > 9.6*   < > 10.1* 10.9* 9.9* 10.1*  HCT 32.5*   < > 30.3*   < > 31.7* 32.0* 31.5* 32.1*  MCV 94.5   < > 94.7  --  95.8  --  95.7 95.8  PLT 118*   < > 110*  --  106*  --  93* 106*   < > = values in this interval not displayed.   Medications:    . (feeding supplement) PROSource Plus  30 mL Oral BID BM  . amiodarone  200 mg Oral BID  . B-complex with vitamin C  1 tablet Oral Daily  . Chlorhexidine Gluconate Cloth  6 each Topical Daily  . Chlorhexidine Gluconate Cloth  6 each Topical Q0600  . dextromethorphan-guaiFENesin  1 tablet Oral BID  . feeding supplement (ENSURE ENLIVE)  237 mL Oral BID BM  . fluticasone  1 spray Each Nare Daily  . folic acid  1 mg Oral Daily  . gabapentin  300 mg Oral BID  . influenza vaccine adjuvanted  0.5 mL Intramuscular Tomorrow-1000  . insulin aspart  0-20  Units Subcutaneous TID WC  . insulin aspart  12 Units Subcutaneous TID WC  . insulin detemir  30 Units Subcutaneous BID  . mouth rinse  15 mL Mouth Rinse BID  . melatonin  3 mg Oral QHS  . midodrine  15 mg Oral TID WC  . mometasone-formoterol  2 puff Inhalation BID  . primidone  100 mg Oral QHS  . tamsulosin  0.4 mg Oral Daily   Robert Shiley, MD 06/11/2020, 7:25 AM

## 2020-06-11 NOTE — Progress Notes (Signed)
PROGRESS NOTE  Robert [name]  DOB: 10-15-1950  PCP: Redmond School, MD YHC:[id number]  DOA: 05/27/2020  LOS: 23 days   Chief Complaint  Patient presents with  . Shortness of Breath   Brief narrative: Robert [name] is a 69 y.o. male with PMH of insulin-dependent diabetes mitis, HTN, CAD/MI s/p multiple interventions, chronic combined systolic and diastolic CHF, CKD 3 COPD, peripheral neuropathy, rheumatoid arthritis. Patient presented to the ED on 05/18/2020 with a week history of weight gain, shortness of breath.  He was in respiratory failure.  Chest x-ray showed pulmonary edema. He was admitted for acute exacerbation of CHF. 9/9, echocardiogram showed new systolic diastolic cardiomyopathy with an EF of 35 to 40%.   9/13, repeat echo showed circumferential pericardial effusion and signs of tamponade.  Underwent pericardiocentesis by CT surgery. Patient was continued on maximal dose of IV diuretics with tapering urine output.  His oxygen requirement worsened. Creatinine worsened.  9/17, Chest x-ray showed large right-sided pleural effusion as well as pulmonary vascular congestion.  Critical care and nephrology consultation were obtained.  HD catheter was placed.  Patient was started on CVVHD.  Subjective: Patient was seen and examined this morning. Remains on CRRT. Blood sugar level fluctuating. Palliative care consultation appreciated.  Assessment/Plan: Diabetes mellitus 2 with peripheral neuropathy -A1c 7.3 on 9/9 -Blood sugar level is low today.  Currently on Levemir 30 units twice daily, scheduled NovoLog Premeal at 12 units 3 times daily and sliding-scale insulin.  Continue Accu-Cheks -Continue Neurontin 300 mg 3 times daily. -Diabetes care coordinator consult appreciated. Recent Labs  Lab 06/10/20 0656 06/10/20 1128 06/10/20 1546 06/10/20 2135 06/11/20 0629  GLUCAP 86 304* 140* 227* 139*   Acute hypoxemic respiratory failure -Multifactorial: CHF, pleural effusion,  pericardial effusion -Continues to require 4 L oxygen by nasal cannula.  Acute exacerbation chronic combined systolic and diastolic CHF Essential hypertension -EF 35 to 40% -Patient remains volume overloaded.  Currently undergoing CRRT.  Heart failure team following.   AKI on CKD stage IIIb -Likely cardiorenal.  Nephrology following.  Patient is on CRRT.  Creatinine trend as below. Recent Labs    06/06/20 1620 06/07/20 0402 06/07/20 1630 06/08/20 0405 06/08/20 1644 06/09/20 0405 06/09/20 1600 06/10/20 0426 06/10/20 1600 06/11/20 0318  BUN 71* 51* 40* 30* 29* 24* 23 23 25* 26*  CREATININE 3.09* 2.31* 1.92* 1.67* 1.60* 1.47* 1.71* 1.59* 1.37* 1.54*   Bilateral pleural effusions -Improved pleural effusion on chest x-ray repeated on 9/20.  Pericardial effusion w/ tamponade  -status post pericardiocentesis on 9/13  A. fib -On amiodarone.  Remains in A. fib.  Currently off anticoagulation because of hemorrhagic pericarditis. -Not a candidate for DCCV per cardiology.  Hyperkalemia/hyperphosphatemia -Related to renal failure.  Trend as below Recent Labs  Lab 06/07/20 0402 06/07/20 1630 06/08/20 0405 06/08/20 1644 06/09/20 0405 06/09/20 0405 06/09/20 1600 06/09/20 1804 06/10/20 0426 06/10/20 1600 06/11/20 0318  K 5.0   < > 5.0   < > 5.1   < > 5.0 4.9 4.7 4.0 4.9  MG 2.1  --  2.4  --  2.6*  --   --   --  2.4  --  2.4  PHOS 3.8   < > 2.8   < > 2.5  --  2.9  --  2.7 2.4* 2.6   < > = values in this interval not displayed.   Hyponatremia -Sodium level is fluctuating.  Mostly between 130 and 135. Recent Labs  Lab 06/07/20 0402 06/07/20 1630 06/08/20 0405  06/08/20 1644 06/09/20 0405 06/09/20 1600 06/09/20 1804 06/10/20 0426 06/10/20 1600 06/11/20 0318  NA 128* 130* 131* 134* 133* 130* 133* 132* 133* 130*   Thrombocytopenia -Platelet level running low but fluctuating.  PF4 antibody sent. No immediate indication of argatroban. -Platelet level is up to 106  today. Recent Labs  Lab 06/05/20 0500 06/06/20 0410 06/06/20 1620 06/07/20 0402 06/08/20 0405 06/09/20 0405 06/10/20 0426 06/11/20 0318  PLT 62* 80* 91* 118* 110* 106* 93* 106*   Elevated liver enzymes -Improving.  Negative acute hepatitis panel. -Repeat liver function panel today. Recent Labs  Lab 06/04/20 1259 06/05/20 0500 06/07/20 0402 06/07/20 1630 06/09/20 1600 06/10/20 0426 06/10/20 1200 06/10/20 1600 06/11/20 0318  AST 31  --  70*  --   --   --  101*  --   --   ALT 71*  --  110*  --   --   --  151*  --   --   ALKPHOS 135*  --  144*  --   --   --  211*  --   --   BILITOT 0.5  --  0.6  --   --   --  0.6  --   --   PROT 5.2*  --  5.7*  --   --   --  6.3*  --   --   ALBUMIN 2.5*   < > 2.6*  2.5*   < > 2.5* 2.6* 2.6* 2.3* 2.6*   < > = values in this interval not displayed.   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  Pressure injury of skin  - Per documentation of flow sheets  Muscular deconditioning - PT/OT consults  Goals of care -palliative care consultation appreciated.  DNR now.  Noted a plan of family meeting on Monday.  Mobility: PT OT eval. encourage ambulation.   Code Status:   Code Status: DNR  Nutritional status: Body mass index is 30.14 kg/m. Nutrition Problem: Increased nutrient needs Etiology: acute illness (AKI on CRRT) Signs/Symptoms: estimated needs Diet Order            Diet Carb Modified Fluid consistency: Thin; Room service appropriate? Yes; Fluid restriction: 1200 mL Fluid  Diet effective now                 DVT prophylaxis: heparin 10,000 units/ 20 mL infusion syringe  Start: 06/06/20 1745 heparin bolus via infusion syringe 1,000 Units Start: 06/06/20 1741 Place and maintain sequential compression device Start: 06/03/20 0900 Place TED hose Start: 05/31/20 0933 SCD's Start: 05/24/2020 1942   Antimicrobials:  None Fluid: None Consultants: Nephrology, cardiology Family Communication:  None at bedside  Status is: Inpatient Remains inpatient appropriate because: Patient is on CRRT  Dispo: The patient is from: Home              Anticipated d/c is to: SNF most likely              Anticipated d/c date is: > 3 days              Patient currently is not medically stable to  d/c.  Infusions:  . sodium chloride    . sodium chloride    . ferric gluconate (FERRLECIT/NULECIT) IV Stopped (06/10/20 1108)  . heparin 10,000 units/ 20 mL infusion syringe 1,750 Units/hr (06/11/20 0836)  . milrinone 0.125 mcg/kg/min (06/11/20 0900)  . norepinephrine (LEVOPHED) Adult infusion 7 mcg/min (06/11/20 0900)  . prismasol BGK 2/2.5 replacement solution 500 mL/hr at 06/11/20 0312  . prismasol BGK 2/2.5 replacement solution 200 mL/hr at 06/11/20 0919  . prismasol BGK 4/2.5 1,500 mL/hr at 06/11/20 6195    Scheduled Meds: . (feeding supplement) PROSource Plus  30 mL Oral BID BM  . amiodarone  200 mg Oral BID  . B-complex with vitamin C  1 tablet Oral Daily  . Chlorhexidine Gluconate Cloth  6 each Topical Daily  . Chlorhexidine Gluconate Cloth  6 each Topical Q0600  . dextromethorphan-guaiFENesin  1 tablet Oral BID  . feeding supplement (ENSURE ENLIVE)  237 mL Oral BID BM  . fluticasone  1 spray Each Nare Daily  . folic acid  1 mg Oral Daily  . gabapentin  300 mg Oral BID  . influenza vaccine adjuvanted  0.5 mL Intramuscular Tomorrow-1000  . insulin aspart  0-20 Units Subcutaneous TID WC  . insulin aspart  12 Units Subcutaneous TID WC  . insulin detemir  30 Units Subcutaneous BID  . mouth rinse  15 mL Mouth Rinse BID  . melatonin  3 mg Oral QHS  . midodrine  15 mg Oral  TID WC  . mometasone-formoterol  2 puff Inhalation BID  . primidone  100 mg Oral QHS  . tamsulosin  0.4 mg Oral Daily    Antimicrobials: Anti-infectives (From admission, onward)   None      PRN meds: sodium chloride, sodium chloride, acetaminophen, albuterol, alteplase, heparin, heparin, heparin, heparin, lidocaine (PF), lidocaine-prilocaine, loperamide, Muscle Rub, ondansetron (ZOFRAN) IV, oxyCODONE, pentafluoroprop-tetrafluoroeth, sodium chloride flush   Objective: Vitals:   06/11/20 0833 06/11/20 0900  BP:  (!) 107/58  Pulse:  98  Resp:  (!) 26  Temp:    SpO2: (!) 87% 91%    Intake/Output Summary (Last 24 hours) at 06/11/2020 0942 Last data filed at 06/11/2020 0900 Gross per 24 hour  Intake 1303.62 ml  Output 4847 ml  Net -3543.38 ml   Filed Weights   06/09/20 0600 06/10/20 0600 06/11/20 0700  Weight: 104.6 kg 106.3 kg 100.8 kg   Weight change: -5.511 kg Body mass index is 30.14 kg/m.   Physical Exam: General exam: Appears calm and comfortable.  Not in physical distress on CRRT Skin: No rashes, lesions or ulcers. HEENT: Atraumatic, normocephalic, supple neck, no obvious bleeding Lungs: Diminished air entry in bases. Remains on supplemental oxygen. CVS: Rate controlled A. fib  GI/Abd soft, nontender, nondistended, bowel sound present CNS: Alert, awake, oriented to place and person Psychiatry: Mood appropriate  Extremities: 2+ pedal edema bilaterally.  No calf tenderness  Data Review: I have personally reviewed the laboratory data and studies available.  Recent Labs  Lab 06/07/20 0402 06/07/20 0402 06/08/20 0405 06/09/20 0405 06/09/20 1804 06/10/20 0426 06/11/20 0318  WBC 16.8*  --  14.3* 14.2*  --  11.2* 11.8*  NEUTROABS 14.5*  --  11.8* 11.8*  --   --   --   HGB 10.5*   < > 9.6* 10.1* 10.9* 9.9* 10.1*  HCT 32.5*   < > 30.3* 31.7* 32.0* 31.5* 32.1*  MCV 94.5  --  94.7 95.8  --  95.7 95.8  PLT 118*  --  110* 106*  --  93* 106*   < > = values in this  interval not displayed.   Recent Labs  Lab 06/07/20 0402 06/07/20 1630 06/08/20 0405 06/08/20 1644 06/09/20 0405 06/09/20 0405 06/09/20 1600 06/09/20 1804 06/10/20 0426 06/10/20 1600 06/11/20 0318  NA 128*   < > 131*   < > 133*   < > 130* 133* 132* 133* 130*  K 5.0   < > 5.0   < > 5.1   < > 5.0 4.9 4.7 4.0 4.9  CL 96*   < > 98   < > 99  --  97*  --  98 103 98  CO2 23   < > 25   < > 26  --  26  --  27 23 25   GLUCOSE 155*   < > 73   < > 75  --  144*  --  111* 182* 181*  BUN 51*   < > 30*   < > 24*  --  23  --  23 25* 26*  CREATININE 2.31*   < > 1.67*   < > 1.47*  --  1.71*  --  1.59* 1.37* 1.54*  CALCIUM 8.1*   < > 8.2*   < > 8.4*  --  8.3*  --  8.6* 7.5* 8.4*  MG 2.1  --  2.4  --  2.6*  --   --   --  2.4  --  2.4  PHOS 3.8   < > 2.8   < > 2.5  --  2.9  --  2.7 2.4* 2.6   < > = values in this interval not displayed.   F/u labs ordered  Signed, Terrilee Croak, MD [facility] 06/11/2020

## 2020-06-11 NOTE — Progress Notes (Signed)
Physical Therapy Treatment Patient Details Name: Robert [name] MRN: [id number] DOB: 09-06-1951 Today's Date: 06/11/2020    History of Present Illness 69 yo admitted with dyspnea, AFib with pericardial effusion with tamponade s/p pericardiocentesis 9/14. Course complicated by AKI on CKD with CRRT (9/17-9/22, restarted 9/26). PMhx: RA, COPD, CHF, CAD, covid, neuropathy, HTN, HLD    PT Comments    Pt willing to mobilize but remains limited by fatigue with DOE 3/4 with standing and transfers. Pt with increased standing tolerance able to stand 2 min for marching and perform pivots but still unable to walk even short distances limited by CRRT. Pt with seated rest between transfers and HEP.  SpO2 88-97% on 4L throughout  HR 94 90/57 (67)   Follow Up Recommendations  Home health PT;Supervision/Assistance - 24 hour     Equipment Recommendations  None recommended by PT    Recommendations for Other Services       Precautions / Restrictions Precautions Precautions: Fall;Other (comment) Precaution Comments: CRRT    Mobility  Bed Mobility Overal bed mobility: Needs Assistance Bed Mobility: Supine to Sit     Supine to sit: HOB elevated;Supervision     General bed mobility comments: supervision for lines, HOB 30 degrees with use of rail and increased time. pt able to move legs and elevate trunk on his own  Transfers Overall transfer level: Needs assistance   Transfers: Sit to/from Stand;Stand Pivot Transfers Sit to Stand: Min assist Stand pivot transfers: Min assist       General transfer comment: min assist to rise from bed x 2 trials with cues for hand placement, pivot bed to chair with RW. Stand from chair with assist to rise  Ambulation/Gait                 Stairs             Wheelchair Mobility    Modified Rankin (Stroke Patients Only)       Balance Overall balance assessment: Needs assistance   Sitting balance-Leahy Scale: Good        Standing balance-Leahy Scale: Poor Standing balance comment: bil UE support on RW                            Cognition Arousal/Alertness: Awake/alert Behavior During Therapy: WFL for tasks assessed/performed Overall Cognitive Status: Within Functional Limits for tasks assessed                                        Exercises General Exercises - Lower Extremity Long Arc Quad: AROM;Both;Seated;20 reps Hip Flexion/Marching: AROM;Both;Seated;20 reps Toe Raises: AROM;Right;Seated;20 reps Heel Raises: AROM;Right;Seated;20 reps Other Exercises Other Exercises: standing marching bil LE x 30 reps, AROM    General Comments        Pertinent Vitals/Pain Pain Assessment: No/denies pain    Home Living                      Prior Function            PT Goals (current goals can now be found in the care plan section) Progress towards PT goals: Progressing toward goals    Frequency    Min 3X/week      PT Plan Current plan remains appropriate    Co-evaluation  AM-PAC PT "6 Clicks" Mobility   Outcome Measure  Help needed turning from your back to your side while in a flat bed without using bedrails?: A Little Help needed moving from lying on your back to sitting on the side of a flat bed without using bedrails?: A Little Help needed moving to and from a bed to a chair (including a wheelchair)?: A Little Help needed standing up from a chair using your arms (e.g., wheelchair or bedside chair)?: A Little Help needed to walk in hospital room?: A Lot Help needed climbing 3-5 steps with a railing? : Total 6 Click Score: 15    End of Session Equipment Utilized During Treatment: Gait belt Activity Tolerance: Patient tolerated treatment well Patient left: in chair;with call bell/phone within reach;with nursing/sitter in room Nurse Communication: Mobility status PT Visit Diagnosis: Other abnormalities of gait and mobility  (R26.89);Difficulty in walking, not elsewhere classified (R26.2)     Time: 8338-2505 PT Time Calculation (min) (ACUTE ONLY): 26 min  Charges:  $Therapeutic Exercise: 8-22 mins $Therapeutic Activity: 8-22 mins                     Alaiza Yau P, PT Acute Rehabilitation Services Pager: [phone] Office: [place] 06/11/2020, 1:39 PM

## 2020-06-11 DEATH — deceased

## 2020-06-12 DIAGNOSIS — I313 Pericardial effusion (noninflammatory): Secondary | ICD-10-CM | POA: Diagnosis not present

## 2020-06-12 LAB — CBC
HCT: 33.2 % — ABNORMAL LOW (ref 39.0–52.0)
Hemoglobin: 10.6 g/dL — ABNORMAL LOW (ref 13.0–17.0)
MCH: 31.1 pg (ref 26.0–34.0)
MCHC: 31.9 g/dL (ref 30.0–36.0)
MCV: 97.4 fL (ref 80.0–100.0)
Platelets: 94 10*3/uL — ABNORMAL LOW (ref 150–400)
RBC: 3.41 MIL/uL — ABNORMAL LOW (ref 4.22–5.81)
RDW: 15.9 % — ABNORMAL HIGH (ref 11.5–15.5)
WBC: 11.1 10*3/uL — ABNORMAL HIGH (ref 4.0–10.5)
nRBC: 0 % (ref 0.0–0.2)

## 2020-06-12 LAB — HEPARIN LEVEL (UNFRACTIONATED): Heparin Unfractionated: 0.44 IU/mL (ref 0.30–0.70)

## 2020-06-12 LAB — RENAL FUNCTION PANEL
Albumin: 2.5 g/dL — ABNORMAL LOW (ref 3.5–5.0)
Albumin: 2.5 g/dL — ABNORMAL LOW (ref 3.5–5.0)
Anion gap: 9 (ref 5–15)
Anion gap: 11 (ref 5–15)
BUN: 27 mg/dL — ABNORMAL HIGH (ref 8–23)
BUN: 35 mg/dL — ABNORMAL HIGH (ref 8–23)
CO2: 24 mmol/L (ref 22–32)
CO2: 24 mmol/L (ref 22–32)
Calcium: 8.7 mg/dL — ABNORMAL LOW (ref 8.9–10.3)
Calcium: 8.6 mg/dL — ABNORMAL LOW (ref 8.9–10.3)
Chloride: 98 mmol/L (ref 98–111)
Chloride: 96 mmol/L — ABNORMAL LOW (ref 98–111)
Creatinine, Ser: 1.51 mg/dL — ABNORMAL HIGH (ref 0.61–1.24)
Creatinine, Ser: 1.9 mg/dL — ABNORMAL HIGH (ref 0.61–1.24)
GFR calc Af Amer: 55 mL/min — ABNORMAL LOW (ref >60)
GFR calc Af Amer: 41 mL/min — ABNORMAL LOW (ref >60)
GFR calc non Af Amer: 47 mL/min — ABNORMAL LOW (ref >60)
GFR calc non Af Amer: 36 mL/min — ABNORMAL LOW (ref >60)
Glucose, Bld: 91 mg/dL (ref 70–99)
Glucose, Bld: 243 mg/dL — ABNORMAL HIGH (ref 70–99)
Phosphorus: 2.9 mg/dL (ref 2.5–4.6)
Phosphorus: 3.5 mg/dL (ref 2.5–4.6)
Potassium: 4.8 mmol/L (ref 3.5–5.1)
Potassium: 4.7 mmol/L (ref 3.5–5.1)
Sodium: 131 mmol/L — ABNORMAL LOW (ref 135–145)
Sodium: 131 mmol/L — ABNORMAL LOW (ref 135–145)

## 2020-06-12 LAB — APTT: aPTT: 174 seconds (ref 24–36)

## 2020-06-12 LAB — CULTURE, BLOOD (ROUTINE X 2)
Culture: NO GROWTH
Culture: NO GROWTH
Special Requests: ADEQUATE

## 2020-06-12 LAB — COOXEMETRY PANEL
Carboxyhemoglobin: 1.2 % (ref 0.5–1.5)
Methemoglobin: 0.7 % (ref 0.0–1.5)
O2 Saturation: 66.6 %
Total hemoglobin: 10.8 g/dL — ABNORMAL LOW (ref 12.0–16.0)

## 2020-06-12 LAB — GLUCOSE, CAPILLARY
Glucose-Capillary: 76 mg/dL (ref 70–99)
Glucose-Capillary: 309 mg/dL — ABNORMAL HIGH (ref 70–99)
Glucose-Capillary: 236 mg/dL — ABNORMAL HIGH (ref 70–99)
Glucose-Capillary: 171 mg/dL — ABNORMAL HIGH (ref 70–99)

## 2020-06-12 LAB — MAGNESIUM: Magnesium: 2.6 mg/dL — ABNORMAL HIGH (ref 1.7–2.4)

## 2020-06-12 MED ORDER — ATORVASTATIN CALCIUM 80 MG PO TABS
80.0000 mg | ORAL_TABLET | Freq: Every day | ORAL | Status: DC
  Administered 2020-06-12 – 2020-06-15 (×4): 80 mg via ORAL
  Filled 2020-06-12 (×4): qty 1

## 2020-06-12 MED ORDER — ASPIRIN 81 MG PO CHEW
81.0000 mg | CHEWABLE_TABLET | Freq: Every day | ORAL | Status: DC
  Administered 2020-06-12 – 2020-06-15 (×4): 81 mg via ORAL
  Filled 2020-06-12 (×4): qty 1

## 2020-06-12 NOTE — Progress Notes (Signed)
PROGRESS NOTE  [name]  DOB: 11-Jan-1951  PCP: Redmond School, MD PPJ:[id number]  DOA: 06/07/2020  LOS: 24 days   Chief Complaint  Patient presents with  . Shortness of Breath   Brief narrative: Robert [name] is a 69 y.o. male with PMH of insulin-dependent diabetes mitis, HTN, CAD/MI s/p multiple interventions, chronic combined systolic and diastolic CHF, CKD 3 COPD, peripheral neuropathy, rheumatoid arthritis. Patient presented to the ED on 05/13/2020 with a week history of weight gain, shortness of breath.  He was in respiratory failure.  Chest x-ray showed pulmonary edema. He was admitted for acute exacerbation of CHF.  9/9, echocardiogram showed new systolic diastolic cardiomyopathy with an EF of 35 to 40%.   9/13, repeat echo showed circumferential pericardial effusion and signs of tamponade.  Underwent pericardiocentesis by CT surgery. Patient was continued on maximal dose of IV diuretics with tapering urine output.  His oxygen requirement worsened. Creatinine worsened.  9/17, Chest x-ray showed large right-sided pleural effusion as well as pulmonary vascular congestion.  Critical care and nephrology consultation were obtained.  HD catheter was placed.  Patient was started on CRRT.  Subjective: No acute issues or events overnight, patient tolerating CRRT quite well, looking forward to attempted ambulation later today with PT but otherwise denies nausea, vomiting, diarrhea, constipation, headache, fevers, chills.  Assessment/Plan:  Uncontrolled diabetes mellitus 2 with peripheral neuropathy - A1c 7.3 on 9/9 - Well controlled on Lantus 30 units twice daily, scheduled NovoLog Premeal at 12 units 3 times daily and sliding-scale insulin.  Continue Accu-Cheks - Continue Neurontin 300 mg 3 times daily. - Diabetes care coordinator consult appreciated. Recent Labs  Lab 06/11/20 0629 06/11/20 1130 06/11/20 1621 06/11/20 2124 06/12/20 0701  GLUCAP 139* 164* 203* 119* 76   Acute  hypoxemic respiratory failure, imppproving - Multifactorial: CHF, pleural effusion, pericardial effusion SpO2: 100 % O2 Flow Rate (L/min): 6 L/min  Acute exacerbation chronic combined systolic and diastolic CHF Essential hypertension - EF 35 to 40% - Patient remains volume overloaded.  Currently undergoing CRRT.  Heart failure team following.   AKI on CKD stage IIIb - Likely cardiorenal.  Nephrology following.  Patient is on CRRT.  Creatinine trend as below. Recent Labs    06/07/20 1630 06/08/20 0405 06/08/20 1644 06/09/20 0405 06/09/20 1600 06/10/20 0426 06/10/20 1600 06/11/20 0318 06/11/20 1631 06/12/20 0351  BUN 40* 30* 29* 24* 23 23 25* 26* 29* 27*  CREATININE 1.92* 1.67* 1.60* 1.47* 1.71* 1.59* 1.37* 1.54* 1.58* 1.51*   Bilateral pleural effusions - Improved pleural effusion on chest x-ray repeated on 9/20.  Pericardial effusion w/ tamponade  - Status post pericardiocentesis on 9/13  A. fib -On amiodarone.  Remains in A. fib.  Currently off anticoagulation because of hemorrhagic pericarditis. -Not a candidate for DCCV per cardiology.  Hyperkalemia/hyperphosphatemia -Related to renal failure.  Trend as below Recent Labs  Lab 06/08/20 0405 06/08/20 1644 06/09/20 0405 06/09/20 1600 06/10/20 0426 06/10/20 1600 06/11/20 0318 06/11/20 1631 06/12/20 0351  K 5.0   < > 5.1   < > 4.7 4.0 4.9 4.7 4.8  MG 2.4  --  2.6*  --  2.4  --  2.4  --  2.6*  PHOS 2.8   < > 2.5   < > 2.7 2.4* 2.6 2.8 2.9   < > = values in this interval not displayed.   Hyponatremia -Sodium level is fluctuating.  Mostly between 130 and 135. Recent Labs  Lab 06/08/20 0405 06/08/20 1644 06/09/20 0405 06/09/20  1600 06/09/20 1804 06/10/20 0426 06/10/20 1600 06/11/20 0318 06/11/20 1631 06/12/20 0351  NA 131* 134* 133* 130* 133* 132* 133* 130* 132* 131*   Thrombocytopenia -Platelet level running low but fluctuating.  PF4 antibody sent. No immediate indication of argatroban. -Platelet  level is up to 106 today. Recent Labs  Lab 06/06/20 0410 06/06/20 1620 06/07/20 0402 06/08/20 0405 06/09/20 0405 06/10/20 0426 06/11/20 0318 06/12/20 0351  PLT 80* 91* 118* 110* 106* 93* 106* 94*   Elevated liver enzymes -Improving.  Negative acute hepatitis panel. -Repeat liver function panel today. Recent Labs  Lab 06/07/20 0402 06/07/20 1630 06/10/20 1200 06/10/20 1600 06/11/20 0318 06/11/20 1631 06/12/20 0351  AST 70*  --  101*  --   --   --   --   ALT 110*  --  151*  --   --   --   --   ALKPHOS 144*  --  211*  --   --   --   --   BILITOT 0.6  --  0.6  --   --   --   --   PROT 5.7*  --  6.3*  --   --   --   --   ALBUMIN 2.6*  2.5*   < > 2.6* 2.3* 2.6* 2.6* 2.5*   < > = values in this interval not displayed.   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  Pressure injury of skin  - Per documentation of flow sheets  Muscular deconditioning - PT/OT consults  Goals of care -palliative care consultation appreciated.  DNR now.  Noted a plan of family meeting on Monday.  Mobility: PT OT eval. encourage ambulation.   Code Status:   Code Status: DNR  Nutritional status: Body mass index is 30.38 kg/m. Nutrition Problem: Increased nutrient needs Etiology: acute illness (AKI on CRRT) Signs/Symptoms: estimated needs Diet Order            Diet Carb Modified Fluid consistency: Thin; Room service appropriate? Yes; Fluid restriction: 1200 mL Fluid  Diet effective now                 DVT prophylaxis: heparin 10,000 units/ 20 mL infusion syringe Start: 06/06/20 1745 heparin bolus via infusion syringe 1,000 Units  Start: 06/06/20 1741 Place and maintain sequential compression device Start: 06/03/20 0900 Place TED hose Start: 05/31/20 0933 SCD's Start: 06/05/2020 1942   Antimicrobials:  None Fluid: None Consultants: Nephrology, cardiology Family Communication:  None present  Status is: Inpatient Remains inpatient appropriate because: Patient is on CRRT  Dispo: The patient is from: Home              Anticipated d/c is to: SNF most likely              Anticipated d/c date is: > 3 days              Patient currently is not medically stable to d/c.  Infusions:  . sodium chloride    . sodium chloride    . ferric gluconate (FERRLECIT/NULECIT) IV 125 mg (06/11/20 1136)  . heparin 10,000 units/ 20  mL infusion syringe 2,050 Units/hr (06/12/20 0534)  . norepinephrine (LEVOPHED) Adult infusion 7 mcg/min (06/12/20 0700)  . prismasol BGK 2/2.5 replacement solution 500 mL/hr at 06/12/20 0038  . prismasol BGK 2/2.5 replacement solution 200 mL/hr at 06/11/20 1356  . prismasol BGK 4/2.5 1,500 mL/hr at 06/12/20 0418    Scheduled Meds: . (feeding supplement) PROSource Plus  30 mL Oral BID BM  . amiodarone  200 mg Oral BID  . B-complex with vitamin C  1 tablet Oral Daily  . Chlorhexidine Gluconate Cloth  6 each Topical Daily  . Chlorhexidine Gluconate Cloth  6 each Topical Q0600  . dextromethorphan-guaiFENesin  1 tablet Oral BID  . feeding supplement (ENSURE ENLIVE)  237 mL Oral BID BM  . fluticasone  1 spray Each Nare Daily  . folic acid  1 mg Oral Daily  . gabapentin  300 mg Oral BID  . influenza vaccine adjuvanted  0.5 mL Intramuscular Tomorrow-1000  . insulin aspart  0-20 Units Subcutaneous TID WC  . insulin aspart  12 Units Subcutaneous TID WC  . insulin detemir  30 Units Subcutaneous BID  . mouth rinse  15 mL Mouth Rinse BID  . melatonin  3 mg Oral QHS  . midodrine  15 mg Oral TID WC  . mometasone-formoterol  2 puff Inhalation BID  . primidone  100 mg Oral QHS  . tamsulosin  0.4 mg Oral Daily     Antimicrobials: Anti-infectives (From admission, onward)   None      PRN meds: sodium chloride, sodium chloride, acetaminophen, albuterol, alteplase, heparin, heparin, heparin, heparin, lidocaine (PF), lidocaine-prilocaine, loperamide, Muscle Rub, ondansetron (ZOFRAN) IV, oxyCODONE, pentafluoroprop-tetrafluoroeth, sodium chloride flush   Objective: Vitals:   06/12/20 0600 06/12/20 0630  BP: 99/67 101/68  Pulse: 89 83  Resp: (!) 26 (!) 22  Temp:    SpO2: 98% 91%    Intake/Output Summary (Last 24 hours) at 06/12/2020 0740 Last data filed at 06/12/2020 0700 Gross per 24 hour  Intake 1449.98 ml  Output 4519 ml  Net -3069.02 ml   Filed Weights   06/10/20 0600 06/11/20 0700 06/12/20 0630  Weight: 106.3 kg 100.8 kg 101.6 kg   Weight change: 0.811 kg Body mass index is 30.38 kg/m.   Physical Exam: General exam: Appears calm and comfortable.  Not in physical distress on CRRT Skin: No rashes, lesions or ulcers. HEENT: Atraumatic, normocephalic, large venous catheter left internal jugular with CRRT tubing without erythema, bandage clean dry intact Lungs: Diminished air entry in bases. Remains on supplemental oxygen. CVS: Rate controlled A. fib  GI/Abd soft, nontender, nondistended, bowel sound present CNS: Alert, awake, oriented to place and person Psychiatry: Mood appropriate  Extremities: 2+ pedal edema bilaterally.  No calf tenderness  Data Review: I have personally reviewed the laboratory data and studies available.  Recent Labs  Lab 06/07/20 0402 06/07/20 0402 06/08/20 0405 06/08/20 0405 06/09/20 0405 06/09/20 1804 06/10/20 0426 06/11/20 0318 06/12/20 0351  WBC 16.8*   < > 14.3*  --  14.2*  --  11.2* 11.8* 11.1*  NEUTROABS 14.5*  --  11.8*  --  11.8*  --   --   --   --   HGB 10.5*   < > 9.6*   < > 10.1* 10.9* 9.9* 10.1* 10.6*  HCT 32.5*   < > 30.3*   < > 31.7* 32.0* 31.5* 32.1* 33.2*  MCV 94.5   < > 94.7  --  95.8  --  95.7 95.8 97.4  PLT 118*   < >  110*   --  106*  --  93* 106* 94*   < > = values in this interval not displayed.   Recent Labs  Lab 06/08/20 0405 06/08/20 1644 06/09/20 0405 06/09/20 1600 06/10/20 0426 06/10/20 1600 06/11/20 0318 06/11/20 1631 06/12/20 0351  NA 131*   < > 133*   < > 132* 133* 130* 132* 131*  K 5.0   < > 5.1   < > 4.7 4.0 4.9 4.7 4.8  CL 98   < > 99   < > 98 103 98 99 98  CO2 25   < > 26   < > 27 23 25 25 24   GLUCOSE 73   < > 75   < > 111* 182* 181* 210* 91  BUN 30*   < > 24*   < > 23 25* 26* 29* 27*  CREATININE 1.67*   < > 1.47*   < > 1.59* 1.37* 1.54* 1.58* 1.51*  CALCIUM 8.2*   < > 8.4*   < > 8.6* 7.5* 8.4* 8.7* 8.7*  MG 2.4  --  2.6*  --  2.4  --  2.4  --  2.6*  PHOS 2.8   < > 2.5   < > 2.7 2.4* 2.6 2.8 2.9   < > = values in this interval not displayed.   F/u labs ordered  Signed, Little Ishikawa, DO [facility] 06/12/2020

## 2020-06-12 NOTE — Progress Notes (Signed)
Patient ID: Robert [name], male   DOB: [birth year], 69 y.o.   MRN: [id number] [facility] Progress Note   Assessment/ Plan:   1. Acute kidney Injury on chronic kidney disease stage IIIb: Anuric overnight based on bladder scan (surprisingly had about 500 cc the day prior seen on bladder scan). AKI secondary to acute exacerbation of congestive heart failure in the setting of pericardial effusion.  Continue CRRT at this time for ultrafiltration-to date he is net -30 L.  The plan is to continue CRRT through the weekend and stop it on Monday morning with attempt to transition to intermittent hemodialysis.  If he does not tolerate IHD, palliative care/conservative medical management will be undertaken.  2.  Acute exacerbation of systolic heart failure: Failed to respond to diuretics and currently ongoing extracorporeal fluid removal with CRRT/UF.  On low-dose Levophed and oral midodrine. 3.  Pericardial effusion: Hemorrhagic and will require additional evaluation for malignancy when stable.  Initial cytology negative. 4.  Hyponatremia: Secondary to CHF exacerbation/acute kidney injury; continue to monitor with CRRT/UF.  On fluid restriction 1.2 L/day. 5.  Anemia: Secondary to chronic kidney disease and possibly exacerbated by acute illness/CHF decompensation.  Monitor for overt blood loss and will give intravenous iron/ESA.  Subjective:   Tolerating CRRT without problems/increased pressor requirements.   Objective:   BP 101/68   Pulse 83   Temp 98.2 F (36.8 C)   Resp (!) 22   Ht 6' (1.829 m)   Wt 101.6 kg   SpO2 91%   BMI 30.38 kg/m   Intake/Output Summary (Last 24 hours) at 06/12/2020 0752 Last data filed at 06/12/2020 0700 Gross per 24 hour  Intake 1449.98 ml  Output 4519 ml  Net -3069.02 ml   Weight change: 0.811 kg  Physical Exam: Gen: Awake/alert, resting comfortably in bed.  CVS: Pulse regular rhythm and normal rate, distant S1-S2 Resp: Diminished breath sounds over  bases, no distinct rales Abd: Soft, obese, nontender Ext: Lower extremities in Unna boots  Imaging: No results found.  Labs: BMET Recent Labs  Lab 06/09/20 0405 06/09/20 0405 06/09/20 1600 06/09/20 1804 06/10/20 0426 06/10/20 1600 06/11/20 0318 06/11/20 1631 06/12/20 0351  NA 133*   < > 130* 133* 132* 133* 130* 132* 131*  K 5.1   < > 5.0 4.9 4.7 4.0 4.9 4.7 4.8  CL 99  --  97*  --  98 103 98 99 98  CO2 26  --  26  --  27 23 25 25 24   GLUCOSE 75  --  144*  --  111* 182* 181* 210* 91  BUN 24*  --  23  --  23 25* 26* 29* 27*  CREATININE 1.47*  --  1.71*  --  1.59* 1.37* 1.54* 1.58* 1.51*  CALCIUM 8.4*  --  8.3*  --  8.6* 7.5* 8.4* 8.7* 8.7*  PHOS 2.5  --  2.9  --  2.7 2.4* 2.6 2.8 2.9   < > = values in this interval not displayed.   CBC Recent Labs  Lab 06/07/20 0402 06/07/20 0402 06/08/20 0405 06/08/20 0405 06/09/20 0405 06/09/20 0405 06/09/20 1804 06/10/20 0426 06/11/20 0318 06/12/20 0351  WBC 16.8*   < > 14.3*   < > 14.2*  --   --  11.2* 11.8* 11.1*  NEUTROABS 14.5*  --  11.8*  --  11.8*  --   --   --   --   --   HGB 10.5*   < >  9.6*   < > 10.1*   < > 10.9* 9.9* 10.1* 10.6*  HCT 32.5*   < > 30.3*   < > 31.7*   < > 32.0* 31.5* 32.1* 33.2*  MCV 94.5   < > 94.7   < > 95.8  --   --  95.7 95.8 97.4  PLT 118*   < > 110*   < > 106*  --   --  93* 106* 94*   < > = values in this interval not displayed.   Medications:    . (feeding supplement) PROSource Plus  30 mL Oral BID BM  . amiodarone  200 mg Oral BID  . B-complex with vitamin C  1 tablet Oral Daily  . Chlorhexidine Gluconate Cloth  6 each Topical Daily  . Chlorhexidine Gluconate Cloth  6 each Topical Q0600  . dextromethorphan-guaiFENesin  1 tablet Oral BID  . feeding supplement (ENSURE ENLIVE)  237 mL Oral BID BM  . fluticasone  1 spray Each Nare Daily  . folic acid  1 mg Oral Daily  . gabapentin  300 mg Oral BID  . influenza vaccine adjuvanted  0.5 mL Intramuscular Tomorrow-1000  . insulin aspart  0-20  Units Subcutaneous TID WC  . insulin aspart  12 Units Subcutaneous TID WC  . insulin detemir  30 Units Subcutaneous BID  . mouth rinse  15 mL Mouth Rinse BID  . melatonin  3 mg Oral QHS  . midodrine  15 mg Oral TID WC  . mometasone-formoterol  2 puff Inhalation BID  . primidone  100 mg Oral QHS  . tamsulosin  0.4 mg Oral Daily   [name], MD 06/12/2020, 7:52 AM

## 2020-06-12 NOTE — Progress Notes (Signed)
Patient ID: Robert [name], male   DOB: 12-20-1950, 69 y.o.   MRN: [id number]     Advanced Heart Failure Rounding Note  PCP-Cardiologist: Robert Klein, MD   Subjective:    He remains on NE 7.  Co-ox 67%.  CVP 13-14, pulling CVVH 150 cc/hr net negative.   Sitting up in bed, coughing this morning.   Objective:   Weight Range: 101.6 kg Body mass index is 30.38 kg/m.   Vital Signs:   Temp:  [97.8 F (36.6 C)-98.2 F (36.8 C)] 98.2 F (36.8 C) (10/01 2359) Pulse Rate:  [69-112] 79 (10/02 0800) Resp:  [16-33] 26 (10/02 0800) BP: (86-121)/(50-72) 95/69 (10/02 0800) SpO2:  [85 %-100 %] 95 % (10/02 0800) Weight:  [101.6 kg] 101.6 kg (10/02 0630) Last BM Date: 06/11/20  Weight change: Filed Weights   06/10/20 0600 06/11/20 0700 06/12/20 0630  Weight: 106.3 kg 100.8 kg 101.6 kg    Intake/Output:   Intake/Output Summary (Last 24 hours) at 06/12/2020 0903 Last data filed at 06/12/2020 0700 Gross per 24 hour  Intake 1198.55 ml  Output 4179 ml  Net -2980.45 ml      Physical Exam    CVP 13-14 General: NAD Neck: JVP 12 cm, no thyromegaly or thyroid nodule.  Lungs: Bilateral rhonchi CV: Nondisplaced PMI.  Heart irregular S1/S2, no S3/S4, no murmur.  Trace ankle edema.  Abdomen: Soft, nontender, no hepatosplenomegaly, no distention.  Skin: Intact without lesions or rashes.  Neurologic: Alert and oriented x 3.  Psych: Normal affect. Extremities: No clubbing or cyanosis.  HEENT: Normal.    Telemetry    A fib 90s Personally reviewed  Labs    CBC Recent Labs    06/11/20 0318 06/12/20 0351  WBC 11.8* 11.1*  HGB 10.1* 10.6*  HCT 32.1* 33.2*  MCV 95.8 97.4  PLT 106* 94*   Basic Metabolic Panel Recent Labs    06/11/20 0318 06/11/20 0318 06/11/20 1631 06/12/20 0351  NA 130*   < > 132* 131*  K 4.9   < > 4.7 4.8  CL 98   < > 99 98  CO2 25   < > 25 24  GLUCOSE 181*   < > 210* 91  BUN 26*   < > 29* 27*  CREATININE 1.54*   < > 1.58* 1.51*  CALCIUM 8.4*   < >  8.7* 8.7*  MG 2.4  --   --  2.6*  PHOS 2.6   < > 2.8 2.9   < > = values in this interval not displayed.   Liver Function Tests Recent Labs    06/10/20 1200 06/10/20 1600 06/11/20 1631 06/12/20 0351  AST 101*  --   --   --   ALT 151*  --   --   --   ALKPHOS 211*  --   --   --   BILITOT 0.6  --   --   --   PROT 6.3*  --   --   --   ALBUMIN 2.6*   < > 2.6* 2.5*   < > = values in this interval not displayed.   No results for input(s): LIPASE, AMYLASE in the last 72 hours. Cardiac Enzymes No results for input(s): CKTOTAL, CKMB, CKMBINDEX, TROPONINI in the last 72 hours.  BNP: BNP (last 3 results) Recent Labs    07/03/19 2313 05/16/20 1228 05/15/2020 1310  BNP 346.0* 149.0* 157.0*    ProBNP (last 3 results) No results for input(s): PROBNP in the  last 8760 hours.   D-Dimer No results for input(s): DDIMER in the last 72 hours. Hemoglobin A1C No results for input(s): HGBA1C in the last 72 hours. Fasting Lipid Panel No results for input(s): CHOL, HDL, LDLCALC, TRIG, CHOLHDL, LDLDIRECT in the last 72 hours. Thyroid Function Tests No results for input(s): TSH, T4TOTAL, T3FREE, THYROIDAB in the last 72 hours.  Invalid input(s): FREET3  Other results:   Imaging    No results found.   Medications:     Scheduled Medications:  (feeding supplement) PROSource Plus  30 mL Oral BID BM   amiodarone  200 mg Oral BID   aspirin  81 mg Oral Daily   atorvastatin  80 mg Oral Daily   B-complex with vitamin C  1 tablet Oral Daily   Chlorhexidine Gluconate Cloth  6 each Topical Daily   Chlorhexidine Gluconate Cloth  6 each Topical Q0600   dextromethorphan-guaiFENesin  1 tablet Oral BID   feeding supplement (ENSURE ENLIVE)  237 mL Oral BID BM   fluticasone  1 spray Each Nare Daily   folic acid  1 mg Oral Daily   gabapentin  300 mg Oral BID   influenza vaccine adjuvanted  0.5 mL Intramuscular Tomorrow-1000   insulin aspart  0-20 Units Subcutaneous TID WC    insulin aspart  12 Units Subcutaneous TID WC   insulin detemir  30 Units Subcutaneous BID   mouth rinse  15 mL Mouth Rinse BID   melatonin  3 mg Oral QHS   midodrine  15 mg Oral TID WC   mometasone-formoterol  2 puff Inhalation BID   primidone  100 mg Oral QHS   tamsulosin  0.4 mg Oral Daily    Infusions:  sodium chloride     sodium chloride     ferric gluconate (FERRLECIT/NULECIT) IV 125 mg (06/11/20 1136)   heparin 10,000 units/ 20 mL infusion syringe 2,050 Units/hr (06/12/20 0534)   norepinephrine (LEVOPHED) Adult infusion 7 mcg/min (06/12/20 0700)   prismasol BGK 2/2.5 replacement solution 500 mL/hr at 06/12/20 0038   prismasol BGK 2/2.5 replacement solution 200 mL/hr at 06/11/20 1356   prismasol BGK 4/2.5 1,500 mL/hr at 06/12/20 0800    PRN Medications: sodium chloride, sodium chloride, acetaminophen, albuterol, alteplase, heparin, heparin, heparin, heparin, lidocaine (PF), lidocaine-prilocaine, loperamide, Muscle Rub, ondansetron (ZOFRAN) IV, oxyCODONE, pentafluoroprop-tetrafluoroeth, sodium chloride flush    Patient Profile   Robert [name] a 69 y.o.malewith a 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  Assessment/Plan   1. Acute on chronic systolic HF due to iCM - Echo 05/2019 with EF 50-55%, - Echo 9/21 EF 35-40% with moderate to large pericardial effusion - Massively volume overloaded this admit and required CVVHD - Milrinone started on 9/25 for co-ox 53%. NE added to support CVVHD. Co-ox now 65% - Volume status improved with CVVHD (263 -> 213) then trended back upto 240 with iHD - CVVHD restarted on 9/26, currently pulling 150 cc/hr net negative.  CVP 13-14 today, continue current UF.  - Continue midodrine at 15 tid.  Off milrinone but still requiring NE 7. Will try to wean slowly.  - Concerned that he will not be able to tolerate iHD (pressor dependent even on CVVH at this point).  Only hope  for survival seems to be recovery of renal function which I am not optimistic about that at all. Palliative care now following. Will pull as much fluid as possible on CVVHD. Then attempt to  get back on iHD and wean inotropes (or wait for recovery) => plan on stopping CVVH Monday. If fails, then only option would be Palliative Care.  - Continue UNNA boots  2. Pericardial effusion. hemorrhagic - s/p tap this admit with 1L bloody fluid - cytology negative - echo 05/31/20: no recurrence - unclear etiology. will need non-contrast chest CT at some point to exclude malignancy   3. AKI - likely cardiorenal/ATN - CVVHD 9/17-9/22  Weight 263-> 213 (after CVVHD stopped weight 230 -> 240) - iHD 9/24 with only 1.3L off - Restarted CVVHD 9/26 - Good UF. Remains overloaded -> continue to pull. Support with NE and midodrine 15. Milrinone off.    - See plan abvoe  4. Acute on chronic hypoxic respiratory failure - predominantly due to volume overload but also has underlying COPD - need more fluid off today - on 4L HF, coughing.   5. CAD - No s/s ischemia ischemia - continue ASA/statin   6. P AFL/Fib  - now on po amiodarone for rate control.   - Remains in Afib.  - not candidate for DC-CV currently as AC off due to hemorrhagic pericardial effusion   7. LE wounds  - venous stasis with PAD as well   8. ID  - WBC stable. Afebrile - Cultures NGTD  CRITICAL CARE Performed by: Loralie Champagne  Total critical care time: 35 minutes  Critical care time was exclusive of separately billable procedures and treating other patients.  Critical care was necessary to treat or prevent imminent or life-threatening deterioration.  Critical care was time spent personally by me (independent of midlevel providers or residents) on the following activities: development of treatment plan with patient and/or surrogate as well as nursing, discussions with consultants, evaluation of patient's response to treatment,  examination of patient, obtaining history from patient or surrogate, ordering and performing treatments and interventions, ordering and review of laboratory studies, ordering and review of radiographic studies, pulse oximetry and re-evaluation of patient's condition.  Loralie Champagne, MD  9:03 AM

## 2020-06-13 DIAGNOSIS — I313 Pericardial effusion (noninflammatory): Secondary | ICD-10-CM | POA: Diagnosis not present

## 2020-06-13 LAB — RENAL FUNCTION PANEL
Albumin: 2.6 g/dL — ABNORMAL LOW (ref 3.5–5.0)
Albumin: 2.6 g/dL — ABNORMAL LOW (ref 3.5–5.0)
Anion gap: 9 (ref 5–15)
Anion gap: 10 (ref 5–15)
BUN: 36 mg/dL — ABNORMAL HIGH (ref 8–23)
BUN: 41 mg/dL — ABNORMAL HIGH (ref 8–23)
CO2: 25 mmol/L (ref 22–32)
CO2: 24 mmol/L (ref 22–32)
Calcium: 8.9 mg/dL (ref 8.9–10.3)
Calcium: 8.5 mg/dL — ABNORMAL LOW (ref 8.9–10.3)
Chloride: 95 mmol/L — ABNORMAL LOW (ref 98–111)
Chloride: 96 mmol/L — ABNORMAL LOW (ref 98–111)
Creatinine, Ser: 2.2 mg/dL — ABNORMAL HIGH (ref 0.61–1.24)
Creatinine, Ser: 2.03 mg/dL — ABNORMAL HIGH (ref 0.61–1.24)
GFR calc Af Amer: 35 mL/min — ABNORMAL LOW (ref >60)
GFR calc Af Amer: 38 mL/min — ABNORMAL LOW (ref >60)
GFR calc non Af Amer: 30 mL/min — ABNORMAL LOW (ref >60)
GFR calc non Af Amer: 33 mL/min — ABNORMAL LOW (ref >60)
Glucose, Bld: 207 mg/dL — ABNORMAL HIGH (ref 70–99)
Glucose, Bld: 276 mg/dL — ABNORMAL HIGH (ref 70–99)
Phosphorus: 3.7 mg/dL (ref 2.5–4.6)
Phosphorus: 3.6 mg/dL (ref 2.5–4.6)
Potassium: 5.2 mmol/L — ABNORMAL HIGH (ref 3.5–5.1)
Potassium: 5.1 mmol/L (ref 3.5–5.1)
Sodium: 129 mmol/L — ABNORMAL LOW (ref 135–145)
Sodium: 130 mmol/L — ABNORMAL LOW (ref 135–145)

## 2020-06-13 LAB — CBC
HCT: 35.6 % — ABNORMAL LOW (ref 39.0–52.0)
Hemoglobin: 10.6 g/dL — ABNORMAL LOW (ref 13.0–17.0)
MCH: 29.6 pg (ref 26.0–34.0)
MCHC: 29.8 g/dL — ABNORMAL LOW (ref 30.0–36.0)
MCV: 99.4 fL (ref 80.0–100.0)
Platelets: 91 10*3/uL — ABNORMAL LOW (ref 150–400)
RBC: 3.58 MIL/uL — ABNORMAL LOW (ref 4.22–5.81)
RDW: 15.9 % — ABNORMAL HIGH (ref 11.5–15.5)
WBC: 10.5 10*3/uL (ref 4.0–10.5)
nRBC: 0 % (ref 0.0–0.2)

## 2020-06-13 LAB — POCT ACTIVATED CLOTTING TIME
Activated Clotting Time: 252 seconds
Activated Clotting Time: 213 seconds

## 2020-06-13 LAB — GLUCOSE, CAPILLARY
Glucose-Capillary: 166 mg/dL — ABNORMAL HIGH (ref 70–99)
Glucose-Capillary: 247 mg/dL — ABNORMAL HIGH (ref 70–99)
Glucose-Capillary: 287 mg/dL — ABNORMAL HIGH (ref 70–99)
Glucose-Capillary: 205 mg/dL — ABNORMAL HIGH (ref 70–99)

## 2020-06-13 LAB — APTT: aPTT: >200 seconds (ref 24–36)

## 2020-06-13 LAB — COOXEMETRY PANEL
Carboxyhemoglobin: 1 % (ref 0.5–1.5)
Methemoglobin: 0.8 % (ref 0.0–1.5)
O2 Saturation: 49.1 %
Total hemoglobin: 12.1 g/dL (ref 12.0–16.0)

## 2020-06-13 LAB — MAGNESIUM: Magnesium: 2.7 mg/dL — ABNORMAL HIGH (ref 1.7–2.4)

## 2020-06-13 LAB — HEPARIN LEVEL (UNFRACTIONATED): Heparin Unfractionated: 0.66 IU/mL (ref 0.30–0.70)

## 2020-06-13 MED ORDER — PRISMASOL BGK 0/2.5 32-2.5 MEQ/L IV SOLN
INTRAVENOUS | Status: DC
  Filled 2020-06-13 (×12): qty 5000

## 2020-06-13 NOTE — Progress Notes (Signed)
During the dressing change, it was noted that the sutures were no longer intact.  The dressing was changed with sterile technique and Dr. Posey Pronto and Dr. Avon Gully were notified.  Dr. Posey Pronto advised to leave in place, as long as the CRRT was still functional.  Currently, the CRRT machine is still functional with no alarms.  Will continue to monitor.

## 2020-06-13 NOTE — Progress Notes (Signed)
PROGRESS NOTE  Robert [name]  DOB: 1950-11-19  PCP: Redmond School, MD PIR:[id number]  DOA: 05/25/2020  LOS: 25 days   Chief Complaint  Patient presents with  . Shortness of Breath   Brief narrative: Robert [name] is a 69 y.o. male with PMH of insulin-dependent diabetes mitis, HTN, CAD/MI s/p multiple interventions, chronic combined systolic and diastolic CHF, CKD 3 COPD, peripheral neuropathy, rheumatoid arthritis. Patient presented to the ED on 06/07/2020 with a week history of weight gain, shortness of breath.  He was in respiratory failure.  Chest x-ray showed pulmonary edema. He was admitted for acute exacerbation of CHF.  9/9, echocardiogram showed new systolic diastolic cardiomyopathy with an EF of 35 to 40%.   9/13, repeat echo showed circumferential pericardial effusion and signs of tamponade.  Underwent pericardiocentesis by CT surgery. Patient was continued on maximal dose of IV diuretics with tapering urine output.  His oxygen requirement worsened. Creatinine worsened.  9/17, Chest x-ray showed large right-sided pleural effusion as well as pulmonary vascular congestion.  Critical care and nephrology consultation were obtained.  HD catheter was placed.  Patient was started on CRRT. 10/3, issue with CRRT cartridge leak - likely to resume in the next 24h - discussion with nephro/family on 10/4 about long term planning HD vs palliative if unable to tolerate  Subjective: CRRT unit clotted overnight, pending reinitiation, patient feels somewhat down and with low energy today but otherwise states he slept well denies nausea, vomiting, diarrhea, constipation, headache, fevers, chills.  Assessment/Plan:  Uncontrolled diabetes mellitus 2 with peripheral neuropathy - A1c 7.3 on 9/9 - Well controlled on Lantus 30 units twice daily, scheduled NovoLog Premeal at 12 units 3 times daily and sliding-scale insulin.  Continue Accu-Cheks - Continue Neurontin 300 mg 3 times daily. - Diabetes  care coordinator consult appreciated. Recent Labs  Lab 06/12/20 1153 06/12/20 1539 06/12/20 2149 06/13/20 0637 06/13/20 1120  GLUCAP 309* 236* 171* 166* 247*   Acute hypoxemic respiratory failure, improving - Multifactorial: CHF, pleural effusion, pericardial effusion SpO2: 98 % O2 Flow Rate (L/min): 6 L/min  Acute exacerbation chronic combined systolic and diastolic CHF Essential hypertension - EF 35 to 40% - Patient remains volume overloaded.  Currently undergoing CRRT.  Heart failure team following -appreciate insight and recommendations.   AKI on CKD stage IIIb - Likely cardiorenal.  Nephrology following.  Patient is on CRRT.  Creatinine trend as below. Recent Labs    06/08/20 1644 06/09/20 0405 06/09/20 1600 06/10/20 0426 06/10/20 1600 06/11/20 0318 06/11/20 1631 06/12/20 0351 06/12/20 1606 06/13/20 0313  BUN 29* 24* 23 23 25* 26* 29* 27* 35* 36*  CREATININE 1.60* 1.47* 1.71* 1.59* 1.37* 1.54* 1.58* 1.51* 1.90* 2.20*   Bilateral pleural effusions - Improved pleural effusion on chest x-ray repeated on 9/20.  Pericardial effusion w/ tamponade  - Status post pericardiocentesis on 9/13  A. fib -On amiodarone.  Remains in A. fib.  Currently off anticoagulation because of hemorrhagic pericarditis. -Not a candidate for DCCV per cardiology.  Hyperkalemia/hyperphosphatemia -Related to renal failure.  Trend as below Recent Labs  Lab 06/09/20 0405 06/09/20 1600 06/10/20 0426 06/10/20 1600 06/11/20 0318 06/11/20 1631 06/12/20 0351 06/12/20 1606 06/13/20 0313  K 5.1   < > 4.7   < > 4.9 4.7 4.8 4.7 5.2*  MG 2.6*  --  2.4  --  2.4  --  2.6*  --  2.7*  PHOS 2.5   < > 2.7   < > 2.6 2.8 2.9 3.5 3.7   < > =  values in this interval not displayed.   Hyponatremia -Sodium level is fluctuating.  Mostly between 130 and 135. Recent Labs  Lab 06/09/20 0405 06/09/20 1600 06/09/20 1804 06/10/20 0426 06/10/20 1600 06/11/20 0318 06/11/20 1631 06/12/20 0351  06/12/20 1606 06/13/20 0313  NA 133* 130* 133* 132* 133* 130* 132* 131* 131* 129*   Thrombocytopenia -Platelet level running low but fluctuating.  PF4 antibody sent. No immediate indication of argatroban. -Platelet level is up to 106 today. Recent Labs  Lab 06/06/20 1620 06/07/20 0402 06/08/20 0405 06/09/20 0405 06/10/20 0426 06/11/20 0318 06/12/20 0351 06/13/20 0313  PLT 91* 118* 110* 106* 93* 106* 94* 91*   Elevated liver enzymes -Improving.  Negative acute hepatitis panel. -Repeat liver function panel q72h x2 Recent Labs  Lab 06/07/20 0402 06/07/20 1630 06/10/20 1200 06/10/20 1600 06/11/20 0318 06/11/20 1631 06/12/20 0351 06/12/20 1606 06/13/20 0313  AST 70*  --  101*  --   --   --   --   --   --   ALT 110*  --  151*  --   --   --   --   --   --   ALKPHOS 144*  --  211*  --   --   --   --   --   --   BILITOT 0.6  --  0.6  --   --   --   --   --   --   PROT 5.7*  --  6.3*  --   --   --   --   --   --   ALBUMIN 2.6*  2.5*   < > 2.6*   < > 2.6* 2.6* 2.5* 2.5* 2.6*   < > = values in this interval not displayed.   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  Pressure injury of skin  - Per documentation of wound care/nursing  Muscular deconditioning - PT/OT consults  Goals of care -palliative care consultation appreciated.  DNR now.  Noted a plan of family meeting on Monday.  Plan is to discuss transition from CRRT to intermittent hemodialysis, if patient is able to tolerate likely then transition to outpatient dialysis for continued management.  If patient cannot tolerate intermittent hemodialysis in the setting of profound comorbidities as above as discussed the likely need to discuss further with palliative care as patient cannot remain on CRRT for prolonged period of time with any meaningful quality of  life.  Mobility: PT OT eval. encourage ambulation.   Code Status:   Code Status: DNR  Nutritional status: Body mass index is 28.76 kg/m. Nutrition Problem: Increased nutrient needs Etiology: acute illness (AKI on CRRT) Signs/Symptoms: estimated needs Diet Order            Diet Carb Modified Fluid consistency: Thin; Room service appropriate? Yes; Fluid restriction: 1200 mL Fluid  Diet effective now                 DVT prophylaxis: heparin 10,000 units/ 20 mL infusion syringe Start: 06/06/20 1745 heparin bolus via infusion syringe 1,000 Units Start: 06/06/20 1741 Place and maintain sequential compression device Start: 06/03/20 0900 Place TED hose Start: 05/31/20 0933 SCD's Start: 06/04/2020 1942   Antimicrobials:  None Fluid: None Consultants: Nephrology, cardiology Family Communication:  None present  Status is: Inpatient Remains inpatient appropriate because: Patient is on CRRT  Dispo: The patient is from: Home              Anticipated d/c is to: SNF most likely              Anticipated d/c date is: > 3 days              Patient currently is not medically stable to d/c.  Infusions:  . sodium chloride    . sodium chloride    . heparin 10,000 units/ 20 mL infusion syringe 1,950 Units/hr (06/13/20 1100)  . norepinephrine (LEVOPHED) Adult infusion 4 mcg/min (06/13/20 0800)  . prismasol bgk dialysis solution with potassium 1,500 mL/hr at 06/13/20 1100  . prismasol BGK 2/2.5 replacement solution 500 mL/hr at 06/13/20 1100  . prismasol BGK 2/2.5 replacement solution 200 mL/hr at 06/13/20 1100    Scheduled Meds: . (feeding supplement) PROSource Plus  30 mL Oral BID BM  . amiodarone  200 mg Oral BID  . aspirin  81 mg Oral Daily  . atorvastatin  80 mg Oral Daily  . B-complex with vitamin C  1 tablet Oral Daily  . Chlorhexidine Gluconate Cloth  6 each Topical Daily  . Chlorhexidine Gluconate Cloth  6 each Topical Q0600  . dextromethorphan-guaiFENesin  1 tablet Oral BID  .  feeding supplement (ENSURE ENLIVE)  237 mL Oral BID BM  . fluticasone  1 spray Each Nare Daily  . folic acid  1 mg Oral Daily  . gabapentin  300 mg Oral BID  . influenza vaccine adjuvanted  0.5 mL Intramuscular Tomorrow-1000  . insulin aspart  0-20 Units Subcutaneous TID WC  . insulin aspart  12 Units Subcutaneous TID WC  . insulin detemir  30 Units Subcutaneous BID  . mouth rinse  15 mL Mouth Rinse BID  . melatonin  3 mg Oral QHS  . midodrine  15 mg Oral TID WC  . mometasone-formoterol  2 puff Inhalation BID  . primidone  100 mg Oral QHS  . tamsulosin  0.4 mg Oral Daily    Antimicrobials: Anti-infectives (From admission, onward)   None      PRN meds: sodium chloride, sodium chloride, acetaminophen, albuterol, alteplase, heparin, heparin, heparin, heparin, lidocaine (PF), lidocaine-prilocaine, loperamide, Muscle Rub, ondansetron (ZOFRAN) IV, oxyCODONE, pentafluoroprop-tetrafluoroeth, sodium chloride flush   Objective: Vitals:   06/13/20 0911 06/13/20 1127  BP:    Pulse: 87   Resp: (!) 29   Temp:  97.7 F (36.5 C)  SpO2: 98%     Intake/Output Summary (Last 24 hours) at 06/13/2020 1137 Last data filed at 06/13/2020 0800 Gross per 24 hour  Intake 1409.46 ml  Output 6162 ml  Net -4752.54 ml   Filed Weights   06/11/20 0700 06/12/20 0630 06/13/20 0500  Weight: 100.8 kg 101.6 kg 96.2 kg   Weight change: -5.4 kg Body mass index is 28.76 kg/m.   Physical Exam: General exam: Appears calm and comfortable.  Not in physical distress on CRRT Skin: No rashes, lesions or ulcers. HEENT: Atraumatic, normocephalic, large venous catheter left internal jugular with CRRT tubing without erythema, bandage clean dry intact Lungs: Diminished air entry in bases. Remains on supplemental oxygen. CVS: Rate controlled A. fib  GI/Abd soft, nontender, nondistended, bowel sound present CNS: Alert, awake, oriented to place and person Psychiatry: Mood appropriate  Extremities: 2+ pedal edema  bilaterally.  No calf tenderness  Data Review: I have personally reviewed the laboratory data and studies available.  Recent Labs  Lab 06/07/20 0402 06/07/20 0402 06/08/20  0405 06/08/20 0405 06/09/20 0405 06/09/20 0405 06/09/20 1804 06/10/20 0426 06/11/20 0318 06/12/20 0351 06/13/20 0313  WBC 16.8*   < > 14.3*   < > 14.2*  --   --  11.2* 11.8* 11.1* 10.5  NEUTROABS 14.5*  --  11.8*  --  11.8*  --   --   --   --   --   --   HGB 10.5*   < > 9.6*   < > 10.1*   < > 10.9* 9.9* 10.1* 10.6* 10.6*  HCT 32.5*   < > 30.3*   < > 31.7*   < > 32.0* 31.5* 32.1* 33.2* 35.6*  MCV 94.5   < > 94.7   < > 95.8  --   --  95.7 95.8 97.4 99.4  PLT 118*   < > 110*   < > 106*  --   --  93* 106* 94* 91*   < > = values in this interval not displayed.   Recent Labs  Lab 06/09/20 0405 06/09/20 1600 06/10/20 0426 06/10/20 1600 06/11/20 0318 06/11/20 1631 06/12/20 0351 06/12/20 1606 06/13/20 0313  NA 133*   < > 132*   < > 130* 132* 131* 131* 129*  K 5.1   < > 4.7   < > 4.9 4.7 4.8 4.7 5.2*  CL 99   < > 98   < > 98 99 98 96* 95*  CO2 26   < > 27   < > 25 25 24 24 25   GLUCOSE 75   < > 111*   < > 181* 210* 91 243* 207*  BUN 24*   < > 23   < > 26* 29* 27* 35* 36*  CREATININE 1.47*   < > 1.59*   < > 1.54* 1.58* 1.51* 1.90* 2.20*  CALCIUM 8.4*   < > 8.6*   < > 8.4* 8.7* 8.7* 8.6* 8.9  MG 2.6*  --  2.4  --  2.4  --  2.6*  --  2.7*  PHOS 2.5   < > 2.7   < > 2.6 2.8 2.9 3.5 3.7   < > = values in this interval not displayed.    Signed, Little Ishikawa, DO [facility]  Pager: Epic chat  06/13/2020

## 2020-06-13 NOTE — Progress Notes (Signed)
Patient ID: Robert [name], male   DOB: 08/18/1951, 69 y.o.   MRN: [id number]     Advanced Heart Failure Rounding Note  PCP-Cardiologist: Sanda Klein, MD   Subjective:    He remains on NE 4.  Co-ox 49% but drawn early am.  CVP 13. CVVH currently off (filter clotted) but down 11 lbs yesterday.   Sitting up in bed, looks more comfortable today.   Objective:   Weight Range: 96.2 kg Body mass index is 28.76 kg/m.   Vital Signs:   Temp:  [97.4 F (36.3 C)-98.2 F (36.8 C)] 97.6 F (36.4 C) (10/03 0054) Pulse Rate:  [52-101] 87 (10/03 0911) Resp:  [16-33] 29 (10/03 0911) BP: (86-112)/(53-80) 97/59 (10/03 0800) SpO2:  [85 %-100 %] 98 % (10/03 0911) Weight:  [96.2 kg] 96.2 kg (10/03 0500) Last BM Date: 06/11/20  Weight change: Filed Weights   06/11/20 0700 06/12/20 0630 06/13/20 0500  Weight: 100.8 kg 101.6 kg 96.2 kg    Intake/Output:   Intake/Output Summary (Last 24 hours) at 06/13/2020 0930 Last data filed at 06/13/2020 0800 Gross per 24 hour  Intake 1702.55 ml  Output 6849 ml  Net -5146.45 ml      Physical Exam    CVP 13 General: NAD Neck: JVP 12 cm, no thyromegaly or thyroid nodule.  Lungs: Distant BS CV: Nondisplaced PMI.  Heart regular S1/S2, no S3/S4, no murmur.  No peripheral edema.   Abdomen: Soft, nontender, no hepatosplenomegaly, no distention.  Skin: Intact without lesions or rashes.  Neurologic: Alert and oriented x 3.  Psych: Normal affect. Extremities: No clubbing or cyanosis.  HEENT: Normal.    Telemetry    A fib 90s Personally reviewed  Labs    CBC Recent Labs    06/12/20 0351 06/13/20 0313  WBC 11.1* 10.5  HGB 10.6* 10.6*  HCT 33.2* 35.6*  MCV 97.4 99.4  PLT 94* 91*   Basic Metabolic Panel Recent Labs    06/12/20 0351 06/12/20 0351 06/12/20 1606 06/13/20 0313  NA 131*   < > 131* 129*  K 4.8   < > 4.7 5.2*  CL 98   < > 96* 95*  CO2 24   < > 24 25  GLUCOSE 91   < > 243* 207*  BUN 27*   < > 35* 36*  CREATININE 1.51*    < > 1.90* 2.20*  CALCIUM 8.7*   < > 8.6* 8.9  MG 2.6*  --   --  2.7*  PHOS 2.9   < > 3.5 3.7   < > = values in this interval not displayed.   Liver Function Tests Recent Labs    06/10/20 1200 06/10/20 1600 06/12/20 1606 06/13/20 0313  AST 101*  --   --   --   ALT 151*  --   --   --   ALKPHOS 211*  --   --   --   BILITOT 0.6  --   --   --   PROT 6.3*  --   --   --   ALBUMIN 2.6*   < > 2.5* 2.6*   < > = values in this interval not displayed.   No results for input(s): LIPASE, AMYLASE in the last 72 hours. Cardiac Enzymes No results for input(s): CKTOTAL, CKMB, CKMBINDEX, TROPONINI in the last 72 hours.  BNP: BNP (last 3 results) Recent Labs    07/03/19 2313 05/16/20 1228 06/07/2020 1310  BNP 346.0* 149.0* 157.0*    ProBNP (  last 3 results) No results for input(s): PROBNP in the last 8760 hours.   D-Dimer No results for input(s): DDIMER in the last 72 hours. Hemoglobin A1C No results for input(s): HGBA1C in the last 72 hours. Fasting Lipid Panel No results for input(s): CHOL, HDL, LDLCALC, TRIG, CHOLHDL, LDLDIRECT in the last 72 hours. Thyroid Function Tests No results for input(s): TSH, T4TOTAL, T3FREE, THYROIDAB in the last 72 hours.  Invalid input(s): FREET3  Other results:   Imaging    No results found.   Medications:     Scheduled Medications: . (feeding supplement) PROSource Plus  30 mL Oral BID BM  . amiodarone  200 mg Oral BID  . aspirin  81 mg Oral Daily  . atorvastatin  80 mg Oral Daily  . B-complex with vitamin C  1 tablet Oral Daily  . Chlorhexidine Gluconate Cloth  6 each Topical Daily  . Chlorhexidine Gluconate Cloth  6 each Topical Q0600  . dextromethorphan-guaiFENesin  1 tablet Oral BID  . feeding supplement (ENSURE ENLIVE)  237 mL Oral BID BM  . fluticasone  1 spray Each Nare Daily  . folic acid  1 mg Oral Daily  . gabapentin  300 mg Oral BID  . influenza vaccine adjuvanted  0.5 mL Intramuscular Tomorrow-1000  . insulin aspart   0-20 Units Subcutaneous TID WC  . insulin aspart  12 Units Subcutaneous TID WC  . insulin detemir  30 Units Subcutaneous BID  . mouth rinse  15 mL Mouth Rinse BID  . melatonin  3 mg Oral QHS  . midodrine  15 mg Oral TID WC  . mometasone-formoterol  2 puff Inhalation BID  . primidone  100 mg Oral QHS  . tamsulosin  0.4 mg Oral Daily    Infusions: . sodium chloride    . sodium chloride    . heparin 10,000 units/ 20 mL infusion syringe 1,950 Units/hr (06/13/20 0640)  . norepinephrine (LEVOPHED) Adult infusion 4 mcg/min (06/13/20 0800)  . prismasol bgk dialysis solution with potassium    . prismasol BGK 2/2.5 replacement solution Stopped (06/13/20 0811)  . 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  Assessment/Plan   1. Acute on chronic systolic HF due to iCM - Echo 05/2019 with EF 50-55%, - Echo 9/21 EF 35-40% with moderate to large pericardial effusion - Massively volume overloaded this admit and required CVVHD - Milrinone started on 9/25 for co-ox 53%. NE added to support CVVHD. Co-ox now 49% but drawn early am => repeat. - Volume status improved with CVVHD (263 -> 213) then trended back up to 240 with iHD - CVVHD restarted on 9/26.  CVP 13 today, suspect 1 more day CVVH, will aim for -150-200 cc/hr net UF today.   - Continue midodrine at 15 tid.  Off milrinone but still requiring NE 4. Will try to wean slowly.  - Concerned that he will not be able to tolerate iHD (pressor dependent even on CVVH at this point).  Only hope for  survival seems to be recovery of renal function which I am not optimistic about that at all. Palliative care now following. Will pull as much  fluid as possible on CVVHD. Then attempt to get back on iHD and wean inotropes (or wait for recovery) => plan on stopping CVVH Monday. If fails, then only option would be Palliative Care.  - Continue UNNA boots  2. Pericardial effusion. hemorrhagic - s/p tap this admit with 1L bloody fluid - cytology negative - echo 05/31/20: no recurrence - unclear etiology. will need non-contrast chest CT at some point to exclude malignancy   3. AKI - likely cardiorenal/ATN - CVVHD 9/17-9/22  Weight 263-> 213 (after CVVHD stopped weight 230 -> 240) - iHD 9/24 with only 1.3L off - Restarted CVVHD 9/26 - Good UF. Remains overloaded -> continue to pull. Support with NE and midodrine 15. Milrinone off.    - See plan abvoe  4. Acute on chronic hypoxic respiratory failure - predominantly due to volume overload but also has underlying COPD - need more fluid off today - on 4L HF, cough improved today.   5. CAD - No s/s ischemia ischemia - continue ASA/statin   6. P AFL/Fib  - now on po amiodarone for rate control.   - Remains in Afib.  - not candidate for DC-CV currently as AC off due to hemorrhagic pericardial effusion (only getting heparin in CVVH, not systemically).    7. LE wounds  - venous stasis with PAD as well   8. ID  - WBC stable. Afebrile - Cultures NGTD  9. Thrombocytopenia - He has had chronic mild thrombocytopenia, has been in 90s-100s.   CRITICAL CARE Performed by: Loralie Champagne  Total critical care time: 35 minutes  Critical care time was exclusive of separately billable procedures and treating other patients.  Critical care was necessary to treat or prevent imminent or life-threatening deterioration.  Critical care was time spent personally by me (independent of midlevel providers or residents) on the following activities:  development of treatment plan with patient and/or surrogate as well as nursing, discussions with consultants, evaluation of patient's response to treatment, examination of patient, obtaining history from patient or surrogate, ordering and performing treatments and interventions, ordering and review of laboratory studies, ordering and review of radiographic studies, pulse oximetry and re-evaluation of patient's condition.  Loralie Champagne, MD  9:30 AM

## 2020-06-13 NOTE — Progress Notes (Signed)
Patient ID: Robert [name], male   DOB: 1950-09-23, 69 y.o.   MRN: [id number] [facility] Progress Note   Assessment/ Plan:   1. Acute kidney Injury on chronic kidney disease stage IIIb: Anuric overnight without any evidence of renal recovery. AKI secondary to acute exacerbation of congestive heart failure in the setting of pericardial effusion.  With significant net negative fluid balance of 5 L overnight overall net negative about 33 L this hospitalization.  The plan is to continue CRRT for the next 24 hours and then discontinue tomorrow morning with the hope of transitioning to IHD while monitoring for renal recovery.  If unable to tolerate intermittent hemodialysis, transition to palliative care/conservative management.  Mild hyperkalemia noted on labs this morning likely from hyperglycemia, switch dialysate to 3K. 2.  Acute exacerbation of systolic heart failure: Failed to respond to diuretics and currently ongoing extracorporeal fluid removal with CRRT/UF.  Remains on Levophed intravenously and oral midodrine.  CVP pending from this morning 3.  Pericardial effusion: Hemorrhagic and will require additional evaluation for malignancy when stable.  Initial cytology negative. 4.  Hyponatremia: Secondary to CHF exacerbation/acute kidney injury; continue to monitor with CRRT/UF.  On fluid restriction 1.2 L/day. 5.  Anemia: Secondary to chronic kidney disease and possibly exacerbated by acute illness/CHF decompensation.  Monitor for overt blood loss and will give intravenous iron/ESA.  Subjective:   Tolerating CRRT without acute issues overnight.  Had episode of significant desaturation yesterday after being bathed.   Objective:   BP 94/70   Pulse (!) 52   Temp 97.6 F (36.4 C) (Oral)   Resp (!) 21   Ht 6' (1.829 m)   Wt 96.2 kg   SpO2 100%   BMI 28.76 kg/m   Intake/Output Summary (Last 24 hours) at 06/13/2020 0743 Last data filed at 06/13/2020 0700 Gross per 24 hour  Intake  2188.94 ml  Output 7225 ml  Net -5036.06 ml   Weight change: -5.4 kg  Physical Exam: Gen: Awake/alert, resting comfortably in bed.  CVS: Pulse regular rhythm and normal rate, distant S1-S2 Resp: Diminished breath sounds over bases, no distinct rales Abd: Soft, obese, nontender Ext: Lower extremities in Unna boots  Imaging: No results found.  Labs: BMET Recent Labs  Lab 06/10/20 0426 06/10/20 1600 06/11/20 0318 06/11/20 1631 06/12/20 0351 06/12/20 1606 06/13/20 0313  NA 132* 133* 130* 132* 131* 131* 129*  K 4.7 4.0 4.9 4.7 4.8 4.7 5.2*  CL 98 103 98 99 98 96* 95*  CO2 27 23 25 25 24 24 25   GLUCOSE 111* 182* 181* 210* 91 243* 207*  BUN 23 25* 26* 29* 27* 35* 36*  CREATININE 1.59* 1.37* 1.54* 1.58* 1.51* 1.90* 2.20*  CALCIUM 8.6* 7.5* 8.4* 8.7* 8.7* 8.6* 8.9  PHOS 2.7 2.4* 2.6 2.8 2.9 3.5 3.7   CBC Recent Labs  Lab 06/07/20 0402 06/07/20 0402 06/08/20 0405 06/08/20 0405 06/09/20 0405 06/09/20 1804 06/10/20 0426 06/11/20 0318 06/12/20 0351 06/13/20 0313  WBC 16.8*   < > 14.3*   < > 14.2*  --  11.2* 11.8* 11.1* 10.5  NEUTROABS 14.5*  --  11.8*  --  11.8*  --   --   --   --   --   HGB 10.5*   < > 9.6*   < > 10.1*   < > 9.9* 10.1* 10.6* 10.6*  HCT 32.5*   < > 30.3*   < > 31.7*   < > 31.5* 32.1* 33.2* 35.6*  MCV  94.5   < > 94.7   < > 95.8  --  95.7 95.8 97.4 99.4  PLT 118*   < > 110*   < > 106*  --  93* 106* 94* 91*   < > = values in this interval not displayed.   Medications:    . (feeding supplement) PROSource Plus  30 mL Oral BID BM  . amiodarone  200 mg Oral BID  . aspirin  81 mg Oral Daily  . atorvastatin  80 mg Oral Daily  . B-complex with vitamin C  1 tablet Oral Daily  . Chlorhexidine Gluconate Cloth  6 each Topical Daily  . Chlorhexidine Gluconate Cloth  6 each Topical Q0600  . dextromethorphan-guaiFENesin  1 tablet Oral BID  . feeding supplement (ENSURE ENLIVE)  237 mL Oral BID BM  . fluticasone  1 spray Each Nare Daily  . folic acid  1 mg Oral  Daily  . gabapentin  300 mg Oral BID  . influenza vaccine adjuvanted  0.5 mL Intramuscular Tomorrow-1000  . insulin aspart  0-20 Units Subcutaneous TID WC  . insulin aspart  12 Units Subcutaneous TID WC  . insulin detemir  30 Units Subcutaneous BID  . mouth rinse  15 mL Mouth Rinse BID  . melatonin  3 mg Oral QHS  . midodrine  15 mg Oral TID WC  . mometasone-formoterol  2 puff Inhalation BID  . primidone  100 mg Oral QHS  . tamsulosin  0.4 mg Oral Daily   [name], MD 06/13/2020, 7:43 AM

## 2020-06-14 ENCOUNTER — Inpatient Hospital Stay (HOSPITAL_COMMUNITY): Payer: Medicare HMO

## 2020-06-14 DIAGNOSIS — I509 Heart failure, unspecified: Secondary | ICD-10-CM | POA: Diagnosis not present

## 2020-06-14 DIAGNOSIS — N1832 Chronic kidney disease, stage 3b: Secondary | ICD-10-CM | POA: Diagnosis not present

## 2020-06-14 DIAGNOSIS — J9601 Acute respiratory failure with hypoxia: Secondary | ICD-10-CM | POA: Diagnosis not present

## 2020-06-14 DIAGNOSIS — Z7189 Other specified counseling: Secondary | ICD-10-CM

## 2020-06-14 DIAGNOSIS — I313 Pericardial effusion (noninflammatory): Secondary | ICD-10-CM | POA: Diagnosis not present

## 2020-06-14 LAB — COMPREHENSIVE METABOLIC PANEL
ALT: 139 U/L — ABNORMAL HIGH (ref 0–44)
AST: 67 U/L — ABNORMAL HIGH (ref 15–41)
Albumin: 2.6 g/dL — ABNORMAL LOW (ref 3.5–5.0)
Alkaline Phosphatase: 257 U/L — ABNORMAL HIGH (ref 38–126)
Anion gap: 8 (ref 5–15)
BUN: 37 mg/dL — ABNORMAL HIGH (ref 8–23)
CO2: 25 mmol/L (ref 22–32)
Calcium: 8.8 mg/dL — ABNORMAL LOW (ref 8.9–10.3)
Chloride: 97 mmol/L — ABNORMAL LOW (ref 98–111)
Creatinine, Ser: 1.95 mg/dL — ABNORMAL HIGH (ref 0.61–1.24)
GFR calc Af Amer: 40 mL/min — ABNORMAL LOW (ref >60)
GFR calc non Af Amer: 35 mL/min — ABNORMAL LOW (ref >60)
Glucose, Bld: 171 mg/dL — ABNORMAL HIGH (ref 70–99)
Potassium: 5.3 mmol/L — ABNORMAL HIGH (ref 3.5–5.1)
Sodium: 130 mmol/L — ABNORMAL LOW (ref 135–145)
Total Bilirubin: 0.4 mg/dL (ref 0.3–1.2)
Total Protein: 6.8 g/dL (ref 6.5–8.1)

## 2020-06-14 LAB — RENAL FUNCTION PANEL
Albumin: 2.6 g/dL — ABNORMAL LOW (ref 3.5–5.0)
Anion gap: 6 (ref 5–15)
BUN: 35 mg/dL — ABNORMAL HIGH (ref 8–23)
CO2: 29 mmol/L (ref 22–32)
Calcium: 8.8 mg/dL — ABNORMAL LOW (ref 8.9–10.3)
Chloride: 96 mmol/L — ABNORMAL LOW (ref 98–111)
Creatinine, Ser: 2 mg/dL — ABNORMAL HIGH (ref 0.61–1.24)
GFR calc Af Amer: 39 mL/min — ABNORMAL LOW (ref >60)
GFR calc non Af Amer: 34 mL/min — ABNORMAL LOW (ref >60)
Glucose, Bld: 168 mg/dL — ABNORMAL HIGH (ref 70–99)
Phosphorus: 3.4 mg/dL (ref 2.5–4.6)
Potassium: 5.5 mmol/L — ABNORMAL HIGH (ref 3.5–5.1)
Sodium: 131 mmol/L — ABNORMAL LOW (ref 135–145)

## 2020-06-14 LAB — CBC
HCT: 35.1 % — ABNORMAL LOW (ref 39.0–52.0)
Hemoglobin: 10.9 g/dL — ABNORMAL LOW (ref 13.0–17.0)
MCH: 30.7 pg (ref 26.0–34.0)
MCHC: 31.1 g/dL (ref 30.0–36.0)
MCV: 98.9 fL (ref 80.0–100.0)
Platelets: 123 10*3/uL — ABNORMAL LOW (ref 150–400)
RBC: 3.55 MIL/uL — ABNORMAL LOW (ref 4.22–5.81)
RDW: 15.8 % — ABNORMAL HIGH (ref 11.5–15.5)
WBC: 11.4 10*3/uL — ABNORMAL HIGH (ref 4.0–10.5)
nRBC: 0 % (ref 0.0–0.2)

## 2020-06-14 LAB — COOXEMETRY PANEL
Carboxyhemoglobin: 1.1 % (ref 0.5–1.5)
Methemoglobin: 0.8 % (ref 0.0–1.5)
O2 Saturation: 66.3 %
Total hemoglobin: 11.6 g/dL — ABNORMAL LOW (ref 12.0–16.0)

## 2020-06-14 LAB — MAGNESIUM: Magnesium: 2.6 mg/dL — ABNORMAL HIGH (ref 1.7–2.4)

## 2020-06-14 LAB — HEPARIN LEVEL (UNFRACTIONATED): Heparin Unfractionated: 0.4 IU/mL (ref 0.30–0.70)

## 2020-06-14 LAB — GLUCOSE, CAPILLARY
Glucose-Capillary: 189 mg/dL — ABNORMAL HIGH (ref 70–99)
Glucose-Capillary: 262 mg/dL — ABNORMAL HIGH (ref 70–99)
Glucose-Capillary: 169 mg/dL — ABNORMAL HIGH (ref 70–99)
Glucose-Capillary: 271 mg/dL — ABNORMAL HIGH (ref 70–99)

## 2020-06-14 LAB — APTT: aPTT: 109 seconds — ABNORMAL HIGH (ref 24–36)

## 2020-06-14 MED ORDER — MIDAZOLAM HCL 2 MG/2ML IJ SOLN
INTRAMUSCULAR | Status: AC
  Administered 2020-06-14: 1 mg
  Filled 2020-06-14: qty 2

## 2020-06-14 MED ORDER — SODIUM ZIRCONIUM CYCLOSILICATE 10 G PO PACK
10.0000 g | PACK | Freq: Once | ORAL | Status: AC
  Administered 2020-06-14: 10 g via ORAL
  Filled 2020-06-14: qty 1

## 2020-06-14 MED ORDER — FENTANYL CITRATE (PF) 100 MCG/2ML IJ SOLN
INTRAMUSCULAR | Status: AC
  Administered 2020-06-14: 25 ug
  Filled 2020-06-14: qty 2

## 2020-06-14 MED ORDER — CAMPHOR-MENTHOL 0.5-0.5 % EX LOTN
TOPICAL_LOTION | CUTANEOUS | Status: DC | PRN
  Filled 2020-06-14 (×2): qty 222

## 2020-06-14 MED ORDER — NEPRO/CARBSTEADY PO LIQD
237.0000 mL | Freq: Two times a day (BID) | ORAL | Status: DC
  Administered 2020-06-14 – 2020-06-15 (×2): 237 mL via ORAL

## 2020-06-14 MED ORDER — CHLORHEXIDINE GLUCONATE CLOTH 2 % EX PADS
6.0000 | MEDICATED_PAD | Freq: Every day | CUTANEOUS | Status: DC
  Administered 2020-06-14: 6 via TOPICAL

## 2020-06-14 NOTE — Progress Notes (Signed)
PROGRESS NOTE  Robert [name]  DOB: [birth year]  PCP: Redmond School, MD CXK:[id number]  DOA: 05/13/2020  LOS: 26 days   Chief Complaint  Patient presents with  . Shortness of Breath   Brief narrative: Robert [name] is a 69 y.o. male with PMH of insulin-dependent diabetes mitis, HTN, CAD/MI s/p multiple interventions, chronic combined systolic and diastolic CHF, CKD 3 COPD, peripheral neuropathy, rheumatoid arthritis. Patient presented to the ED on 06/07/2020 with a week history of weight gain, shortness of breath.  He was in respiratory failure.  Chest x-ray showed pulmonary edema. He was admitted for acute exacerbation of CHF.  9/9, echocardiogram showed new systolic diastolic cardiomyopathy with an EF of 35 to 40%.   9/13, repeat echo showed circumferential pericardial effusion and signs of tamponade.  Underwent pericardiocentesis by CT surgery. Patient was continued on maximal dose of IV diuretics with tapering urine output.  His oxygen requirement worsened. Creatinine worsened.  9/17, Chest x-ray showed large right-sided pleural effusion as well as pulmonary vascular congestion.  Critical care and nephrology consultation were obtained.  HD catheter was placed.  Patient was started on CRRT. 10/3, issue with CRRT cartridge leak - likely to resume in the next 24h - discussion with nephro/family on 10/4 about long term planning HD vs palliative if unable to tolerate 10/4 - Overnight issues with central line - likely need to be replaced  Subjective: CRRT line partially retracted overnight -CRRT continues to be held.  Patient did not sleep well overnight, feels somewhat fatigued and tired but denies overt chest pain, shortness of breath, nausea, vomiting, diarrhea, constipation, headache, fevers, chills.  He is looking forward to trial of hemodialysis today as this will likely dictate his ability to be discharged back home which is his main goal.  Assessment/Plan: Principal Problem:    Pericardial effusion Active Problems:   Hyperlipidemia   COPD (chronic obstructive pulmonary disease) ([place])   Controlled type 2 diabetes mellitus with diabetic peripheral angiopathy without gangrene, with long-term current use of insulin (HCC)   Essential hypertension   Acute respiratory failure with hypoxemia (HCC)   COPD (chronic obstructive pulmonary disease) with emphysema (HCC)   CKD (chronic kidney disease), stage III   Controlled type 2 diabetes mellitus with hyperglycemia, without long-term current use of insulin (HCC)   Acute on chronic combined systolic and diastolic CHF (congestive heart failure) (HCC)   Acute on chronic congestive heart failure (HCC)   Pericardial tamponade   DNR (do not resuscitate)   Palliative care by specialist    Uncontrolled diabetes mellitus 2 with peripheral neuropathy - A1c 7.3 on 9/9 - Well controlled on Lantus 30 units twice daily, scheduled NovoLog Premeal at 12 units 3 times daily and sliding-scale insulin.  Continue Accu-Cheks - Continue Neurontin 300 mg 3 times daily. - Diabetes care coordinator consult appreciated. Recent Labs  Lab 06/13/20 0637 06/13/20 1120 06/13/20 1543 06/13/20 2157 06/14/20 0645  GLUCAP 166* 247* 287* 205* 189*   Acute hypoxemic respiratory failure, improving - Multifactorial: CHF, pleural effusion, pericardial effusion as below SpO2: 95 % O2 Flow Rate (L/min): 6 L/min  Acute exacerbation chronic combined systolic and diastolic CHF Essential hypertension - EF 35 to 40% - Patient remains volume overloaded.  CRRT currently on hold due to line issues, attempt to transition to hemodialysis as below.   - Heart failure team following -appreciate insight and recommendations.   AKI on CKD stage IIIb - Likely cardiorenal.  Nephrology following.  Patient is on CRRT.  Creatinine  trend as below. Recent Labs    06/09/20 1600 06/10/20 0426 06/10/20 1600 06/11/20 0318 06/11/20 1631 06/12/20 0351 06/12/20 1606  06/13/20 0313 06/13/20 1932 06/14/20 0330  BUN 23 23 25* 26* 29* 27* 35* 36* 41* 35*  37*  CREATININE 1.71* 1.59* 1.37* 1.54* 1.58* 1.51* 1.90* 2.20* 2.03* 2.00*  1.95*   Bilateral pleural effusions - Improved pleural effusion on chest x-ray repeated on 9/20.  Pericardial effusion w/ tamponade  - Status post pericardiocentesis on 9/13, stable  A. fib, rate controlled - On amiodarone.  Remains in A. fib.   - Currently off anticoagulation because of hemorrhagic pericarditis. - Not a candidate for DCCV per cardiology.  Hyperkalemia/hyperphosphatemia, stable - Related to renal failure.   - Trend as below Recent Labs  Lab 06/10/20 0426 06/10/20 1600 06/11/20 0318 06/11/20 1631 06/12/20 0351 06/12/20 1606 06/13/20 0313 06/13/20 1932 06/14/20 0330  K 4.7   < > 4.9   < > 4.8 4.7 5.2* 5.1 5.5*  5.3*  MG 2.4  --  2.4  --  2.6*  --  2.7*  --  2.6*  PHOS 2.7   < > 2.6   < > 2.9 3.5 3.7 3.6 3.4   < > = values in this interval not displayed.   Hyponatremia, improving -Improving with CRRT, defer to nephrology for further management Recent Labs  Lab 06/09/20 1804 06/10/20 0426 06/10/20 1600 06/11/20 0318 06/11/20 1631 06/12/20 0351 06/12/20 1606 06/13/20 0313 06/13/20 1932 06/14/20 0330  NA 133* 132* 133* 130* 132* 131* 131* 129* 130* 131*  130*   Thrombocytopenia -Platelet level running low but fluctuating.  PF4 antibody sent. No immediate indication of argatroban. -Platelet level is up to 106 today. Recent Labs  Lab 06/08/20 0405 06/09/20 0405 06/10/20 0426 06/11/20 0318 06/12/20 0351 06/13/20 0313 06/14/20 0330  PLT 110* 106* 93* 106* 94* 91* 123*   Elevated liver enzymes, minimally improving -Improving.  Negative acute hepatitis panel. -Repeat liver function panel q72h x2 Recent Labs  Lab 06/10/20 1200 06/10/20 1600 06/12/20 0351 06/12/20 1606 06/13/20 0313 06/13/20 1932 06/14/20 0330  AST 101*  --   --   --   --   --  67*  ALT 151*  --   --    --   --   --  139*  ALKPHOS 211*  --   --   --   --   --  257*  BILITOT 0.6  --   --   --   --   --  0.4  PROT 6.3*  --   --   --   --   --  6.8  ALBUMIN 2.6*   < > 2.5* 2.5* 2.6* 2.6* 2.6*  2.6*   < > = values in this interval not displayed.   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  Pressure injury of skin  - Per documentation of wound care/nursing  Muscular deconditioning - PT/OT consults  Goals of care -palliative care consultation appreciated.  DNR now.  Noted a plan of family meeting on Monday.  Plan is to discuss transition from CRRT to intermittent hemodialysis, if patient is able to tolerate likely then transition to outpatient dialysis for continued management.  If patient cannot tolerate intermittent hemodialysis in the setting of profound comorbidities as above as discussed the likely need to discuss further with palliative care  as patient cannot remain on CRRT for prolonged period of time with any meaningful quality of life.  Mobility: PT OT eval. encourage ambulation.   Code Status:   Code Status: DNR  Nutritional status: Body mass index is 28.23 kg/m. Nutrition Problem: Increased nutrient needs Etiology: acute illness (AKI on CRRT) Signs/Symptoms: estimated needs Diet Order            Diet Carb Modified Fluid consistency: Thin; Room service appropriate? Yes; Fluid restriction: 1200 mL Fluid  Diet effective now                 DVT prophylaxis: heparin 10,000 units/ 20 mL infusion syringe Start: 06/06/20 1745 heparin bolus via infusion syringe 1,000 Units Start: 06/06/20 1741 Place and maintain sequential compression device Start: 06/03/20 0900 Place TED hose Start: 05/31/20 0933 SCD's Start: 05/16/2020 1942   Antimicrobials:  None Fluid: None Consultants: Nephrology, cardiology Family Communication:   None present  Status is: Inpatient Remains inpatient appropriate because: Patient is on CRRT transitioning to HD  Dispo: The patient is from: Home              Anticipated d/c is to: SNF most likely              Anticipated d/c date is: > 3 days              Patient currently is not medically stable to d/c.  Infusions:  . sodium chloride    . sodium chloride    . heparin 10,000 units/ 20 mL infusion syringe 1,950 Units/hr (06/14/20 0241)  . norepinephrine (LEVOPHED) Adult infusion 8 mcg/min (06/14/20 0700)  . prismasol bgk dialysis solution with potassium Stopped (06/14/20 0307)  . prismasol BGK 2/2.5 replacement solution Stopped (06/14/20 0307)  . prismasol BGK 2/2.5 replacement solution Stopped (06/14/20 0307)    Scheduled Meds: . (feeding supplement) PROSource Plus  30 mL Oral BID BM  . amiodarone  200 mg Oral BID  . aspirin  81 mg Oral Daily  . atorvastatin  80 mg Oral Daily  . B-complex with vitamin C  1 tablet Oral Daily  . Chlorhexidine Gluconate Cloth  6 each Topical Daily  . Chlorhexidine Gluconate Cloth  6 each Topical Q0600  . Chlorhexidine Gluconate Cloth  6 each Topical Q0600  . dextromethorphan-guaiFENesin  1 tablet Oral BID  . feeding supplement (ENSURE ENLIVE)  237 mL Oral BID BM  . fluticasone  1 spray Each Nare Daily  . folic acid  1 mg Oral Daily  . gabapentin  300 mg Oral BID  . influenza vaccine adjuvanted  0.5 mL Intramuscular Tomorrow-1000  . insulin aspart  0-20 Units Subcutaneous TID WC  . insulin aspart  12 Units Subcutaneous TID WC  . insulin detemir  30 Units Subcutaneous BID  . mouth rinse  15 mL Mouth Rinse BID  . melatonin  3 mg Oral QHS  . midodrine  15 mg Oral TID WC  . mometasone-formoterol  2 puff Inhalation BID  . primidone  100 mg Oral QHS  . sodium zirconium cyclosilicate  10 g Oral Once  . tamsulosin  0.4 mg Oral Daily    Antimicrobials: Anti-infectives (From admission, onward)   None      PRN meds: sodium chloride, sodium  chloride, acetaminophen, albuterol, alteplase, heparin, heparin, heparin, heparin, lidocaine (PF), lidocaine-prilocaine, loperamide, Muscle Rub, ondansetron (ZOFRAN) IV, oxyCODONE, pentafluoroprop-tetrafluoroeth, sodium chloride flush   Objective: Vitals:   06/14/20 0630 06/14/20 0700  BP: 105/62 (!) 117/99  Pulse:  71 80  Resp: (!) 38 (!) 29  Temp:    SpO2: 97% 95%    Intake/Output Summary (Last 24 hours) at 06/14/2020 0723 Last data filed at 06/14/2020 0700 Gross per 24 hour  Intake 1995.49 ml  Output 5101 ml  Net -3105.51 ml   Filed Weights   06/12/20 0630 06/13/20 0500 06/14/20 0500  Weight: 101.6 kg 96.2 kg 94.4 kg   Weight change: -1.8 kg Body mass index is 28.23 kg/m.   Physical Exam: General exam: Appears calm and comfortable.  Not in physical distress on CRRT Skin: No rashes, lesions or ulcers. HEENT: Atraumatic, normocephalic, large venous catheter left internal jugular with CRRT tubing without erythema, bandage clean dry intact Lungs: Diminished air entry in bases. Remains on supplemental oxygen. CVS: Rate controlled A. fib  GI/Abd soft, nontender, nondistended, bowel sound present CNS: Alert, awake, oriented to place and person Psychiatry: Mood appropriate  Extremities: 2+ pedal edema bilaterally.  No calf tenderness  Data Review: I have personally reviewed the laboratory data and studies available.  Recent Labs  Lab 06/08/20 0405 06/08/20 0405 06/09/20 0405 06/09/20 1804 06/10/20 0426 06/11/20 0318 06/12/20 0351 06/13/20 0313 06/14/20 0330  WBC 14.3*   < > 14.2*  --  11.2* 11.8* 11.1* 10.5 11.4*  NEUTROABS 11.8*  --  11.8*  --   --   --   --   --   --   HGB 9.6*   < > 10.1*   < > 9.9* 10.1* 10.6* 10.6* 10.9*  HCT 30.3*   < > 31.7*   < > 31.5* 32.1* 33.2* 35.6* 35.1*  MCV 94.7   < > 95.8  --  95.7 95.8 97.4 99.4 98.9  PLT 110*   < > 106*  --  93* 106* 94* 91* 123*   < > = values in this interval not displayed.   Recent Labs  Lab 06/10/20 0426  06/10/20 1600 06/11/20 0318 06/11/20 1631 06/12/20 0351 06/12/20 1606 06/13/20 0313 06/13/20 1932 06/14/20 0330  NA 132*   < > 130*   < > 131* 131* 129* 130* 131*  130*  K 4.7   < > 4.9   < > 4.8 4.7 5.2* 5.1 5.5*  5.3*  CL 98   < > 98   < > 98 96* 95* 96* 96*  97*  CO2 27   < > 25   < > 24 24 25 24 29  25   GLUCOSE 111*   < > 181*   < > 91 243* 207* 276* 168*  171*  BUN 23   < > 26*   < > 27* 35* 36* 41* 35*  37*  CREATININE 1.59*   < > 1.54*   < > 1.51* 1.90* 2.20* 2.03* 2.00*  1.95*  CALCIUM 8.6*   < > 8.4*   < > 8.7* 8.6* 8.9 8.5* 8.8*  8.8*  MG 2.4  --  2.4  --  2.6*  --  2.7*  --  2.6*  PHOS 2.7   < > 2.6   < > 2.9 3.5 3.7 3.6 3.4   < > = values in this interval not displayed.    Signed, Little Ishikawa, DO [facility]  Pager: Epic chat  06/14/2020

## 2020-06-14 NOTE — Progress Notes (Signed)
Daily Progress Note   Patient Name: Robert [name]       Date: 06/14/2020 DOB: July 06, 1951  Age: 69 y.o. MRN#: [id number] Attending Physician: Little Ishikawa, MD Primary Care Physician: Redmond School, MD Admit Date: 05/20/2020  Reason for Consultation/Follow-up: Establishing goals of care  Subjective: Patient awake, alert, oriented and able to participate in goals of care discussion. No complaints.   GOC: F/u GOC with patient and family at bedside including son [name]), daughter [name]), and DIL [name]).   Introduced role of palliative medicine. Patient and family spoke with Wadie Lessen, PMT provider last week.  Patient is a COVID-19 survivor. He was hospitalized for >50 days last summer 2020. Eventually returned home and living with step-son. Fairly independent and able to do for himself prior to admission including driving his 4-wheeler.   Discussed events leading up to admission and course of hospitalization including diagnoses, interventions, plan of care, and tenuous clinical condition. Reviewed recommendations from specialists including HF team and nephrology with concerns he will not tolerate iHD. Unfortunately, RN has had to titrate up levo this afternoon. Plan is for iHD today.   Patient and family do seem to understand how sick he is and that he may not recover from this.   Patient asks questions regarding what will happen if he does not tolerate dialysis. Frankly and compassionate explained transition to comfort focused care plan to ensure symptom management and relief from pain/suffering at EOL. Family tearful but does seem to understand severity of his condition.   [name] shares his belief that this is in God's hands and if it is his time to go, he is ready. He  speaks of seeing how today goes with dialysis and taking this one day at a time. Reassured of ongoing palliative support. Spiritual/emotional support provided.    Answered questions. PMT contact information given to family.    Length of Stay: 26  Current Medications: Scheduled Meds:  . (feeding supplement) PROSource Plus  30 mL Oral BID BM  . amiodarone  200 mg Oral BID  . aspirin  81 mg Oral Daily  . atorvastatin  80 mg Oral Daily  . B-complex with vitamin C  1 tablet Oral Daily  . Chlorhexidine Gluconate Cloth  6 each Topical Daily  . Chlorhexidine Gluconate Cloth  6 each Topical  H9622  . Chlorhexidine Gluconate Cloth  6 each Topical Q0600  . dextromethorphan-guaiFENesin  1 tablet Oral BID  . feeding supplement (ENSURE ENLIVE)  237 mL Oral BID BM  . fluticasone  1 spray Each Nare Daily  . folic acid  1 mg Oral Daily  . gabapentin  300 mg Oral BID  . influenza vaccine adjuvanted  0.5 mL Intramuscular Tomorrow-1000  . insulin aspart  0-20 Units Subcutaneous TID WC  . insulin aspart  12 Units Subcutaneous TID WC  . insulin detemir  30 Units Subcutaneous BID  . mouth rinse  15 mL Mouth Rinse BID  . melatonin  3 mg Oral QHS  . midodrine  15 mg Oral TID WC  . mometasone-formoterol  2 puff Inhalation BID  . primidone  100 mg Oral QHS  . tamsulosin  0.4 mg Oral Daily    Continuous Infusions: . sodium chloride    . sodium chloride    . norepinephrine (LEVOPHED) Adult infusion 8 mcg/min (06/14/20 0900)    PRN Meds: sodium chloride, sodium chloride, acetaminophen, albuterol, alteplase, heparin, heparin, lidocaine (PF), lidocaine-prilocaine, loperamide, Muscle Rub, ondansetron (ZOFRAN) IV, oxyCODONE, pentafluoroprop-tetrafluoroeth, sodium chloride flush  Physical Exam Vitals and nursing note reviewed.  Constitutional:      General: He is awake.     Appearance: He is ill-appearing.  HENT:     Head: Normocephalic and atraumatic.  Cardiovascular:     Rate and Rhythm: Rhythm  irregularly irregular.  Pulmonary:     Effort: No tachypnea, accessory muscle usage or respiratory distress.  Abdominal:     Tenderness: There is no abdominal tenderness.  Skin:    General: Skin is warm and dry.  Neurological:     Mental Status: He is alert and oriented to person, place, and time.  Psychiatric:        Mood and Affect: Mood normal.        Speech: Speech normal.        Behavior: Behavior normal.        Cognition and Memory: Cognition normal.             Vital Signs: BP (!) 100/52   Pulse 73   Temp 99.1 F (37.3 C) (Oral)   Resp (!) 21   Ht 6' (1.829 m)   Wt 94.4 kg   SpO2 100%   BMI 28.23 kg/m  SpO2: SpO2: 100 % O2 Device: O2 Device: Nasal Cannula O2 Flow Rate: O2 Flow Rate (L/min): 6 L/min  Intake/output summary:   Intake/Output Summary (Last 24 hours) at 06/14/2020 0943 Last data filed at 06/14/2020 0900 Gross per 24 hour  Intake 2001.54 ml  Output 4944 ml  Net -2942.46 ml   LBM: Last BM Date: 06/13/20 Baseline Weight: Weight: 108.4 kg Most recent weight: Weight: 94.4 kg       Palliative Assessment/Data: PPS 30%    Flowsheet Rows     Most Recent Value  Intake Tab  Referral Department Hospitalist  Unit at Time of Referral ICU  Palliative Care Primary Diagnosis Cardiac  Date Notified 06/08/20  Palliative Care Type New Palliative care  Reason for referral Clarify Goals of Care  Date of Admission 06/10/2020  Date first seen by Palliative Care 06/10/20  # of days Palliative referral response time 2 Day(s)  # of days IP prior to Palliative referral 20  Clinical Assessment  Psychosocial & Spiritual Assessment  Palliative Care Outcomes      Patient Active Problem List   Diagnosis Date Noted  .  DNR (do not resuscitate)   . Palliative care by specialist   . Pericardial tamponade 05/26/2020  . Pericardial effusion   . Acute on chronic congestive heart failure ([place]) 05/17/2020  . Abnormality of gait 10/02/2019  . Leg length discrepancy  10/02/2019  . Primary osteoarthritis of right knee 08/28/2019  . Chronic pain of both knees 08/04/2019  . Acute on chronic combined systolic and diastolic CHF (congestive heart failure) ([place]) 07/05/2019  . Aspiration pneumonia ([place]) 07/04/2019  . Unspecified atrial fibrillation ([place]) 07/04/2019  . Anemia 07/04/2019  . Aspiration pneumonitis ([place]) 07/04/2019  . Acute respiratory failure with hypoxia ([place])   . CKD (chronic kidney disease), stage III   . Controlled type 2 diabetes mellitus with hyperglycemia, without long-term current use of insulin ([place])   . Urinary retention   . 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 Hypokalemia   . Physical debility 05/22/2019  . Demand ischemia ([place]) 05/20/2019  . Flash pulmonary edema ([place]) 05/20/2019  . Stage II decubitus ulcer ([place]) 05/18/2019  . Aspiration pneumonia of both lower lobes due to gastric secretions ([place]) 05/15/2019  . MRSA pneumonia ([place]) 05/15/2019  . Severe sepsis with septic shock ([place]) 05/15/2019  . NSTEMI (non-ST elevated myocardial infarction) ([place]) 05/15/2019  . Staphylococcal pneumonia ([place]) 04/20/2019  . HCAP (healthcare-associated pneumonia) 04/20/2019  . Pneumonia due to COVID-19 virus 04/18/2019  . COPD (chronic obstructive pulmonary disease) with emphysema ([place]) 04/18/2019  . AKI (acute kidney injury) ([place]) 04/18/2019  . Diabetes mellitus type 2, uncontrolled, with complications ([place]) 47/82/9562  . Pressure injury of skin 04/18/2019  . Atrial fibrillation with RVR ([place]) 04/15/2019  . COVID-19 virus detected 04/12/2019  . Acute respiratory distress syndrome (ARDS) due to COVID-19 virus ([place]) 04/12/2019  . Hypoxia   . Sepsis ([place])   . PAD (peripheral artery disease) ([place]) 03/27/2019  . Pulmonary edema 08/27/2018  . Acute respiratory failure with hypoxemia ([place]) 08/27/2018  . Chronic combined systolic and diastolic heart failure ([place]) 08/27/2018  . Dyslipidemia (high LDL; low HDL) 03/29/2018  . Dysphagia 01/02/2018   . Diarrhea 01/02/2018  . Family history of colon cancer 01/02/2018  . Ischemic cardiomyopathy   . Insulin dependent diabetes mellitus   . Hypertension   . History of MI (myocardial infarction)   . GERD (gastroesophageal reflux disease)   . Full dentures   . Coronary artery disease   . Anxiety   . Acute ST elevation myocardial infarction (STEMI) of inferior wall ([place]) 06/06/2017  . Varicose veins of left lower extremity with complications 13/04/6577  . Cellulitis of left leg without foot   . Right radial fracture 12/11/2016  . T3 vertebral fracture ([place]) 12/11/2016  . Fall 12/09/2016  . Thrombocytopenia ([place]) 10/09/2016  . Left leg cellulitis 10/08/2016  . Diabetes ([place]) 10/08/2016  . Inguinal hernia 05/12/2016  . Incarcerated umbilical hernia 46/96/2952  . Chronic combined systolic and diastolic CHF (congestive heart failure) ([place]) 04/25/2016  . High cholesterol   . Controlled type 2 diabetes mellitus with diabetic peripheral angiopathy without gangrene, with long-term current use of insulin ([place])   . Essential hypertension   . Non-STEMI (non-ST elevated myocardial infarction) ([place])   . CAD S/P percutaneous coronary angioplasty   . Unstable angina ([place]) 03/20/2015  . ICM-EF 35% at cath 02/01/15 02/02/2015  . DM type 2 causing vascular disease, not at goal [facility]) 05/29/2014  . Dyspnea 10/20/2013  . COPD (chronic obstructive pulmonary disease) ([place]) 09/03/2013  . Hyperlipidemia   . Old myocardial infarction  09/01/2013  . Abnormal nuclear stress test 05/17/2012  . S/P CFX PTCAI for ISR 02/01/15 05/17/2012  . PVD, chronic LLE 12/22/2011  . Rheumatoid arthritis ([place]) 12/22/2011  . Contrast media allergy 12/22/2011  . Tobacco abuse 12/21/2011    Palliative Care Assessment & Plan   Patient Profile: 69 y.o. male   admitted on 05/15/2020 with a past medical history  of insulin-dependent diabetes mitis, HTN, CAD/MI s/p multiple interventions, chronic combined systolic and diastolic  CHF, CKD 3 COPD, peripheral neuropathy, rheumatoid arthritis.  Patient presented to the ED on09/30/2021with a week history of weight gain, shortness of breath. He was in respiratory failure. Chest x-ray showed pulmonary edema. He was admitted for acute exacerbation of CHF.  9/9, echocardiogram showed new systolic diastolic cardiomyopathy with an EF of 35 to 40%.   9/13, repeat echo showed circumferential pericardial effusion and signs of tamponade. Underwent pericardiocentesis by CT surgery.  Patient was continued on maximal dose of IV diuretics with tapering urine output. His oxygen requirement worsened. Creatinine worsened.   9/17, Chest x-ray showed large right-sided pleural effusion as well as pulmonary vascular congestion.   10/4: Off CRRT but remains oliguric. Plan is to try intermittent HD (or await renal recovery). HF team and nephrology concerned about patient's ability to tolerate iHD. Prognosis remains guarded with tenuous clinical condition.   Assessment: Acute on chronic systolic heart failure Ischemic cardiomyopathy with EF 35-40% Pericardial effusion s/p pericardiocentesis AKI, likely cardiorenal syndrome Afib/flutter LE wounds, venous stasis with PAD Thrombocytopenia Oliguria  Recommendations/Plan: DNR, otherwise continue current plan of care and full scope treatment. Watchful waiting. Trial of iHD today. Tenuous clinical status. Patient remains on levo. Patient/family do seem to understand severity of his condition. Patient speaks of being "ready" if it is his time to go. He does ask about next steps if he does not tolerate iHD. Explained comfort measures and focus on symptom management and relief from suffering at EOL. Reassured patient/family of ongoing palliative support pending clinical status. Ongoing palliative discussions.    Code Status: DNR   Code Status Orders  (From admission, onward)         Start     Ordered   06/10/20 1633  Do not  attempt resuscitation (DNR)  Continuous       Question Answer Comment  In the event of cardiac or respiratory ARREST Do not call a "code blue"   In the event of cardiac or respiratory ARREST Do not perform Intubation, CPR, defibrillation or ACLS   In the event of cardiac or respiratory ARREST Use medication by any route, position, wound care, and other measures to relive pain and suffering. May use oxygen, suction and manual treatment of airway obstruction as needed for comfort.      06/10/20 1633        Code Status History    Date Active Date Inactive Code Status Order ID Comments User Context   05/19/2020 1658 06/10/2020 1633 Full Code [id number]  Bonnielee Haff, MD ED   07/04/2019 0215 07/07/2019 1853 Full Code [id number]  Reubin Milan, MD ED   05/22/2019 1922 06/07/2019 1224 Full Code [id number]  Bary Leriche, PA-C Inpatient   04/12/2019 0646 05/22/2019 1855 Full Code [id number]  [place], [place], DO Inpatient   08/27/2018 0243 08/28/2018 1830 Full Code [id number]  Oswald Hillock, MD Inpatient   06/06/2017 2355 06/11/2017 2011 Full Code [id number]  Belva Crome, MD Inpatient   03/03/2017 1947 03/05/2017 1741 Full Code [id number]  Loma Boston  J, DO ED   12/09/2016 1624 12/13/2016 1854 Full Code [id number]  Greer Pickerel, MD Inpatient   10/08/2016 1600 10/09/2016 1544 Full Code [id number]  Jule Ser, DO Inpatient   03/22/2015 0844 03/23/2015 1644 Full Code [id number]  Leonie Man, MD Inpatient   03/20/2015 1507 03/22/2015 0844 Full Code [id number]  Jerline Pain, MD Inpatient   02/01/2015 0935 02/02/2015 1420 Full Code [id number]  Belva Crome, MD Inpatient   09/19/2013 0109 09/19/2013 1500 Full Code [id number]  Cletus Gash, MD Inpatient   09/01/2013 2125 09/03/2013 1610 Full Code [id number]  Robet Leu Inpatient   12/21/2011 2337 12/23/2011 1227 Full Code [id number]  Ferne Coe, RN Inpatient   Advance Care Planning Activity      Prognosis:  Guarded: tenuous clinical  condition with high risk for decompensation.   Discharge Planning: To Be Determined  Care plan was discussed with RN, patient, family at bedside (son, daughter, DIL), updated care team via secure epic chat  Thank you for allowing the Palliative Medicine Team to assist in the care of this patient.   Total Time 50 Prolonged Time Billed no      Greater than 50%  of this time was spent counseling and coordinating care related to the above assessment and plan.  Ihor Dow, DNP, FNP-C Palliative Medicine Team  Phone: [phone] Fax: [phone]  Please contact Palliative Medicine Team phone at [phone] for questions and concerns.

## 2020-06-14 NOTE — Progress Notes (Signed)
L IJ HD line noted to be pulled out farther upon assessment. Dressing was changed with sterile technique and reinforced. Dr. Hollie Salk, covering nephrology service, was called and it was decided to stop CRRT now as it was going to be discontinued later on this morning. Line remains in place as is. Patient was educated on being careful with movements that may dislodge tape or catheter further. RN will continue to monitor site.

## 2020-06-14 NOTE — Progress Notes (Signed)
Orthopedic Tech Progress Note Patient Details:  Robert [name] 1951/07/09 [id number]  Ortho Devices Type of Ortho Device: Louretta Parma boot Ortho Device/Splint Location: Bilateral Lower Extremity Ortho Device/Splint Interventions: Ordered, Application   Post Interventions Patient Tolerated: Well Instructions Provided: Adjustment of device, Care of device, Poper ambulation with device   [name] Lorel Monaco 06/14/2020, 4:50 PM

## 2020-06-14 NOTE — Progress Notes (Addendum)
Patient ID: Robert [name], male   DOB: July 29, 1951, 69 y.o.   MRN: [id number]     Advanced Heart Failure Rounding Note  PCP-Cardiologist: Sanda Klein, MD   Subjective:    Now off CRRT, discontinued overnight. Plan is to attempt iHD today however he remains on 8 of NE + 15 of midodrine tid. SBPs upper 80s- low 90s currently.   HD cath pulled back overnight. Needs HD cath replaced. Oliguric. 500 cc in UOP yesterday. K 5.5    Milrinone discontinued 10/1. Co-ox stable at 66%.   In rate controlled Afib.   Looks tired but no complaints. Denies dyspnea. No CP.    Objective:   Weight Range: 94.4 kg Body mass index is 28.23 kg/m.   Vital Signs:   Temp:  [97.7 F (36.5 C)-99.3 F (37.4 C)] 99.3 F (37.4 C) (10/04 0500) Pulse Rate:  [53-101] 80 (10/04 0700) Resp:  [15-38] 29 (10/04 0700) BP: (79-117)/(50-99) 117/99 (10/04 0700) SpO2:  [92 %-100 %] 95 % (10/04 0700) Weight:  [94.4 kg] 94.4 kg (10/04 0500) Last BM Date: 06/11/20  Weight change: Filed Weights   06/12/20 0630 06/13/20 0500 06/14/20 0500  Weight: 101.6 kg 96.2 kg 94.4 kg    Intake/Output:   Intake/Output Summary (Last 24 hours) at 06/14/2020 0815 Last data filed at 06/14/2020 0700 Gross per 24 hour  Intake 1751.74 ml  Output 4944 ml  Net -3192.26 ml      Physical Exam    General: fatigue appearing, no distress  Neck: JVP elevated to jaw, no thyromegaly or thyroid nodule. + Lt IJ HD cath  Lungs: decreased BS at the bases bilaterally  CV: Nondisplaced PMI.  Heart regular S1/S2, no S3/S4, no murmur.  Abdomen: Soft, nontender, no hepatosplenomegaly,  midly distended Skin: Intact without lesions or rashes.   Neurologic: Alert and oriented x 3.  Psych: Pleasant. Normal affect. Extremities: trace bilateral LEE, + bilateral unna boots  HEENT: Normal.    Telemetry    A fib 80s-90s Personally reviewed  Labs    CBC Recent Labs    06/13/20 0313 06/14/20 0330  WBC 10.5 11.4*  HGB 10.6* 10.9*  HCT  35.6* 35.1*  MCV 99.4 98.9  PLT 91* 382*   Basic Metabolic Panel Recent Labs    06/13/20 0313 06/13/20 0313 06/13/20 1932 06/14/20 0330  NA 129*   < > 130* 131*   130*  K 5.2*   < > 5.1 5.5*   5.3*  CL 95*   < > 96* 96*   97*  CO2 25   < > 24 29   25   GLUCOSE 207*   < > 276* 168*   171*  BUN 36*   < > 41* 35*   37*  CREATININE 2.20*   < > 2.03* 2.00*   1.95*  CALCIUM 8.9   < > 8.5* 8.8*   8.8*  MG 2.7*  --   --  2.6*  PHOS 3.7   < > 3.6 3.4   < > = values in this interval not displayed.   Liver Function Tests Recent Labs    06/13/20 1932 06/14/20 0330  AST  --  67*  ALT  --  139*  ALKPHOS  --  257*  BILITOT  --  0.4  PROT  --  6.8  ALBUMIN 2.6* 2.6*   2.6*   No results for input(s): LIPASE, AMYLASE in the last 72 hours. Cardiac Enzymes No results for input(s): CKTOTAL, CKMB, CKMBINDEX, TROPONINI  in the last 72 hours.  BNP: BNP (last 3 results) Recent Labs    07/03/19 2313 05/16/20 1228 05/23/2020 1310  BNP 346.0* 149.0* 157.0*    ProBNP (last 3 results) No results for input(s): PROBNP in the last 8760 hours.   D-Dimer No results for input(s): DDIMER in the last 72 hours. Hemoglobin A1C No results for input(s): HGBA1C in the last 72 hours. Fasting Lipid Panel No results for input(s): CHOL, HDL, LDLCALC, TRIG, CHOLHDL, LDLDIRECT in the last 72 hours. Thyroid Function Tests No results for input(s): TSH, T4TOTAL, T3FREE, THYROIDAB in the last 72 hours.  Invalid input(s): FREET3  Other results:   Imaging    No results found.   Medications:     Scheduled Medications:  (feeding supplement) PROSource Plus  30 mL Oral BID BM   amiodarone  200 mg Oral BID   aspirin  81 mg Oral Daily   atorvastatin  80 mg Oral Daily   B-complex with vitamin C  1 tablet Oral Daily   Chlorhexidine Gluconate Cloth  6 each Topical Daily   Chlorhexidine Gluconate Cloth  6 each Topical Q0600   Chlorhexidine Gluconate Cloth  6 each Topical Q0600    dextromethorphan-guaiFENesin  1 tablet Oral BID   feeding supplement (ENSURE ENLIVE)  237 mL Oral BID BM   fluticasone  1 spray Each Nare Daily   folic acid  1 mg Oral Daily   gabapentin  300 mg Oral BID   influenza vaccine adjuvanted  0.5 mL Intramuscular Tomorrow-1000   insulin aspart  0-20 Units Subcutaneous TID WC   insulin aspart  12 Units Subcutaneous TID WC   insulin detemir  30 Units Subcutaneous BID   mouth rinse  15 mL Mouth Rinse BID   melatonin  3 mg Oral QHS   midodrine  15 mg Oral TID WC   mometasone-formoterol  2 puff Inhalation BID   primidone  100 mg Oral QHS   sodium zirconium cyclosilicate  10 g Oral Once   tamsulosin  0.4 mg Oral Daily    Infusions:  sodium chloride     sodium chloride     heparin 10,000 units/ 20 mL infusion syringe 1,950 Units/hr (06/14/20 0241)   norepinephrine (LEVOPHED) Adult infusion 8 mcg/min (06/14/20 0700)   prismasol bgk dialysis solution with potassium Stopped (06/14/20 0307)   prismasol BGK 2/2.5 replacement solution Stopped (06/14/20 0307)   prismasol BGK 2/2.5 replacement solution Stopped (06/14/20 0307)    PRN Medications: sodium chloride, sodium chloride, acetaminophen, albuterol, alteplase, heparin, heparin, heparin, heparin, lidocaine (PF), lidocaine-prilocaine, loperamide, Muscle Rub, ondansetron (ZOFRAN) IV, oxyCODONE, pentafluoroprop-tetrafluoroeth, sodium chloride flush    Patient Profile   Robert [name] a 69 y.o.malewith a 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  Assessment/Plan   1. Acute on chronic systolic HF due to iCM - Echo 05/2019 with EF 50-55%, - Echo 9/21 EF 35-40% with moderate to large pericardial effusion - Massively volume overloaded this admit and required CVVHD - Milrinone started on 9/25 for co-ox 53%. NE added to support CVVHD. Milrinone discontinued 10/1. Co-ox stable at 66%.  - Volume status improved with  CVVHD (263 -> 213) then trended back up to 240 with iHD - CVVHD restarted on 9/26.  Wt down to 208 lb today (admit weight 239, peak weight 163, previous low this admit 213). CVVH discontinued 10/4 w/ plans to attempt iHD today - Concerned that he will not be able to  tolerate iHD (pressor dependent, on NE 8 + midodrine 15 tid).  Only hope for survival seems to be recovery of renal function which I remain pessimistic about. He remains oliguric. Palliative care now following. If he fails iHD, then only option would be Palliative Care. Family meeting scheduled again for this afternoon.  - Continue UNNA boots   2. Pericardial effusion. hemorrhagic - s/p tap this admit with 1L bloody fluid - cytology negative - echo 05/31/20: no recurrence - unclear etiology. Will need non-contrast chest CT at some point to exclude malignancy   3. AKI - likely cardiorenal/ATN - CVVHD 9/17-9/22  Weight 263-> 213 (after CVVHD stopped weight 230 -> 240) - iHD 9/24 with only 1.3L off - Restarted CVVHD 9/26, stopped 10/4 w/ plans to re-try iHD today  - remains oliguric, K 5.5  - iHD per nephrology      - See plan above   4. Acute on chronic hypoxic respiratory failure - predominantly due to volume overload but also has underlying COPD - continue HD for volume management    5. CAD - No s/s ischemia ischemia - continue ASA/statin   6. P AFL/Fib  - now on po amiodarone for rate control.   - Remains in Afib.  - not candidate for DC-CV currently as AC off due to hemorrhagic pericardial effusion (only getting heparin in CVVH, not systemically).     7. LE wounds  - venous stasis with PAD as well   8. ID  - WBC stable. Afebrile - Cultures NGTD  9. Thrombocytopenia - He has had chronic mild thrombocytopenia, has been in 90s-100s.  - trending up, 123K today   10. Hyperkalemia - 2/2 renal failure - K 5.5 - Give Lokelma  - HD per nephrology   Lyda Jester, PA-C  8:15 AM   Agree with above.    Weight is way down with CVVHD.  CVVHD now off. HD cath pulled back accidentally overnight.   Remains NE 8 + midodrine 15 tid for BP support. Co-ox 66% K 5.5. Breathing better.   Remains in AF  General:  Weak appearing. No resp difficulty HEENT: normal Neck: supple. LIJ cath pulled back  Carotids 2+ bilat; no bruits. No lymphadenopathy or thryomegaly appreciated. Cor: PMI nondisplaced. Irregular rate & rhythm. 2 /6 TR Lungs: clear Abdomen: soft, nontender, nondistended. No hepatosplenomegaly. No bruits or masses. Good bowel sounds. Extremities: no cyanosis, clubbing, rash, edema Neuro: alert & orientedx3, cranial nerves grossly intact. moves all 4 extremities w/o difficulty. Affect pleasant  Volume status likely as good as we can get it. Now off CVVHD. Remains oliguric. Plan is to try and convert to iHD (or await Renal recovery). Given timeline and pressor needs, I am not optimistic about either. We will have family meeting to discuss next steps if we cannot successfully transition to stable outpatient regimen.   HD cath only pulled back 2-3cm on exam but cath not pulling back well and unable to measure CVPs. Will move to RIJ.   CRITICAL CARE Performed by: Glori Bickers  Total critical care time: 35 minutes  Critical care time was exclusive of separately billable procedures and treating other patients.  Critical care was necessary to treat or prevent imminent or life-threatening deterioration.  Critical care was time spent personally by me (independent of midlevel providers or residents) on the following activities: development of treatment plan with patient and/or surrogate as well as nursing, discussions with consultants, evaluation of patient's response to treatment, examination of patient, obtaining history  from patient or surrogate, ordering and performing treatments and interventions, ordering and review of laboratory studies, ordering and review of radiographic studies,  pulse oximetry and re-evaluation of patient's condition.  Glori Bickers, MD  9:30 AM

## 2020-06-14 NOTE — Plan of Care (Signed)
  Problem: Clinical Measurements: Goal: Respiratory complications will improve Outcome: Progressing Goal: Cardiovascular complication will be avoided Outcome: Progressing   Problem: Activity: Goal: Risk for activity intolerance will decrease Outcome: Progressing   Problem: Nutrition: Goal: Adequate nutrition will be maintained Outcome: Progressing   Problem: Elimination: Goal: Will not experience complications related to bowel motility Outcome: Progressing   Problem: Pain Managment: Goal: General experience of comfort will improve Outcome: Progressing   Problem: Clinical Measurements: Goal: Ability to maintain clinical measurements within normal limits will improve Outcome: Not Progressing   Problem: Elimination: Goal: Will not experience complications related to urinary retention Outcome: Not Progressing

## 2020-06-14 NOTE — Progress Notes (Signed)
This RN called Robert [name] to find out when patient was going to be getting Robert tonight. She stated Dr. Johnney Ou gave orders to do Robert tomorrow instead of today. Patient and patient's son [name] notified. Patient currently stable. Will monitor.  Joellen Jersey, RN

## 2020-06-14 NOTE — Progress Notes (Signed)
Isabella Bowens, RN was was made aware that the Pt's HD tx has been moved to 06/14/2020 in the morning.

## 2020-06-14 NOTE — CV Procedure (Signed)
Central Venous Catheter Insertion Procedure Note ANAV LAMMERT [id number] [birth year]    Procedure: Insertion of Central Venous Catheter Indications: Hemodialysis   Procedure Details Consent: Risks of procedure as well as the alternatives and risks of each were explained to the (patient/caregiver).  Consent for procedure obtained. Time Out: Verified patient identification, verified procedure, site/side was marked, verified correct patient position, special equipment/implants available, medications/allergies/relevent history reviewed, required imaging and test results available.  Performed   Maximum sterile technique was used including antiseptics, cap, gloves, gown, hand hygiene, mask and sheet. Skin prep: Chlorhexidine; local anesthetic administered A antimicrobial bonded/coated triple lumen catheter was placed in the right internal jugular vein using the Seldinger technique and u/s guidance.   Evaluation Blood flow good Complications: No apparent complications Patient did tolerate procedure well. Chest X-ray ordered to verify placement.  CXR: pending   Glori Bickers, MD  12:29 PM

## 2020-06-14 NOTE — Progress Notes (Signed)
Inpatient Diabetes Program Recommendations  AACE/ADA: New Consensus Statement on Inpatient Glycemic Control (2015)  Target Ranges:  Prepandial:   less than 140 mg/dL      Peak postprandial:   less than 180 mg/dL (1-2 hours)      Critically ill patients:  140 - 180 mg/dL   Lab Results  Component Value Date   GLUCAP 262 (H) 06/14/2020   HGBA1C 7.3 (H) 05/20/2020    Review of Glycemic Control Results for Robert [name], Robert [name] (MRN [id number]) as of 06/14/2020 14:00  Ref. Range 06/13/2020 11:20 06/13/2020 15:43 06/13/2020 21:57 06/14/2020 06:45 06/14/2020 11:12  Glucose-Capillary Latest Ref Range: 70 - 99 mg/dL 247 (H) 287 (H) 205 (H) 189 (H) 262 (H)   Diabetes history: DM 2 Outpatient Diabetes medications: Precose 100 mg tid with meals, Amaryl 2 mg with breakfast, Toujeo 25 units daily Current orders for Inpatient glycemic control: Novolog resistant tid with meals Novolog 12 units tid with meals Lantus 30 units bid Inpatient Diabetes Program Recommendations:    If post-prandial blood sugars>200 mg/dL, may consider increasing Novolog meal coverage 15 units tid with meals.  Thanks,  Adah Perl, RN, BC-ADM Inpatient Diabetes Coordinator Pager [phone] (8a-5p)

## 2020-06-14 NOTE — Progress Notes (Signed)
Patient ID: Robert [name], male   DOB: 31-Oct-1950, 69 y.o.   MRN: [id number] [facility] Progress Note   Assessment/ Plan:   1. Acute kidney Injury on chronic kidney disease stage IIIb:  AKI secondary to acute exacerbation of congestive heart failure in the setting of pericardial effusion.  With significant net negative fluid balance of 3.1 L yesterday overall net negative about 33 L this hospitalization.  The plan is to hold CRRT now and trial a transition to IHD while monitoring for renal recovery which at this point is unlikely.  If unable to tolerate intermittent hemodialysis will need to transition to palliative care/conservative management.  His HD catheter will need to be replaced given how retracted it is -- I asked his RN to d/w primary team to replace bedside.  We will do HD this afternoon or tomorrow AM after the catheter is replaced.   2.  Acute exacerbation of systolic heart failure: Failed to respond to diuretics and has tolerated extracorporeal fluid removal with CRRT/UF, per above trial of HD.  Remains on Levophed intravenously and oral midodrine.  I'm concerned his hemodynamics will not tolerate HD. 3.  Pericardial effusion: Hemorrhagic and will require additional evaluation for malignancy when stable.  Initial cytology negative. 4.  Hyponatremia: Secondary to CHF exacerbation/acute kidney injury; continue to monitor with RRT.  On fluid restriction 1.2 L/day. 5.  Anemia: Secondary to chronic kidney disease and possibly exacerbated by acute illness/CHF decompensation.  Monitor for overt blood loss and will give intravenous iron/ESA. 6.  Hyperkalemia:  K 5.5 this AM, lokelma 10g po now.   Subjective:   CRRT stopped overnight with line displacement.  He is awake and eating breakfast this AM.  No new complaints.    Objective:   BP 105/62   Pulse 71   Temp 99.3 F (37.4 C) (Oral)   Resp (!) 38   Ht 6' (1.829 m)   Wt 94.4 kg   SpO2 97%   BMI 28.23 kg/m    Intake/Output Summary (Last 24 hours) at 06/14/2020 0650 Last data filed at 06/14/2020 0600 Gross per 24 hour  Intake 1990.73 ml  Output 5256 ml  Net -3265.27 ml   Weight change: -1.8 kg  Physical Exam: Gen: Awake/alert, eating full breakfast CVS: Pulse regular rhythm and normal rate, distant S1-S2 Resp: Diminished breath sounds over bases, no distinct rales Abd: Soft, obese, nontender Ext: Lower extremities in Unna boots Dialysis access:  20cm L IJ temp HD catheter with hub retracted to near ear.   Imaging: No results found.  Labs: BMET Recent Labs  Lab 06/11/20 0318 06/11/20 1631 06/12/20 0351 06/12/20 1606 06/13/20 0313 06/13/20 1932 06/14/20 0330  NA 130* 132* 131* 131* 129* 130* 131*  130*  K 4.9 4.7 4.8 4.7 5.2* 5.1 5.5*  5.3*  CL 98 99 98 96* 95* 96* 96*  97*  CO2 25 25 24 24 25 24 29  25   GLUCOSE 181* 210* 91 243* 207* 276* 168*  171*  BUN 26* 29* 27* 35* 36* 41* 35*  37*  CREATININE 1.54* 1.58* 1.51* 1.90* 2.20* 2.03* 2.00*  1.95*  CALCIUM 8.4* 8.7* 8.7* 8.6* 8.9 8.5* 8.8*  8.8*  PHOS 2.6 2.8 2.9 3.5 3.7 3.6 3.4   CBC Recent Labs  Lab 06/08/20 0405 06/08/20 0405 06/09/20 0405 06/09/20 1804 06/11/20 0318 06/12/20 0351 06/13/20 0313 06/14/20 0330  WBC 14.3*   < > 14.2*   < > 11.8* 11.1* 10.5 11.4*  NEUTROABS 11.8*  --  11.8*  --   --   --   --   --   HGB 9.6*   < > 10.1*   < > 10.1* 10.6* 10.6* 10.9*  HCT 30.3*   < > 31.7*   < > 32.1* 33.2* 35.6* 35.1*  MCV 94.7   < > 95.8   < > 95.8 97.4 99.4 98.9  PLT 110*   < > 106*   < > 106* 94* 91* 123*   < > = values in this interval not displayed.   Medications:    . (feeding supplement) PROSource Plus  30 mL Oral BID BM  . amiodarone  200 mg Oral BID  . aspirin  81 mg Oral Daily  . atorvastatin  80 mg Oral Daily  . B-complex with vitamin C  1 tablet Oral Daily  . Chlorhexidine Gluconate Cloth  6 each Topical Daily  . Chlorhexidine Gluconate Cloth  6 each Topical Q0600  .  dextromethorphan-guaiFENesin  1 tablet Oral BID  . feeding supplement (ENSURE ENLIVE)  237 mL Oral BID BM  . fluticasone  1 spray Each Nare Daily  . folic acid  1 mg Oral Daily  . gabapentin  300 mg Oral BID  . influenza vaccine adjuvanted  0.5 mL Intramuscular Tomorrow-1000  . insulin aspart  0-20 Units Subcutaneous TID WC  . insulin aspart  12 Units Subcutaneous TID WC  . insulin detemir  30 Units Subcutaneous BID  . mouth rinse  15 mL Mouth Rinse BID  . melatonin  3 mg Oral QHS  . midodrine  15 mg Oral TID WC  . mometasone-formoterol  2 puff Inhalation BID  . primidone  100 mg Oral QHS  . tamsulosin  0.4 mg Oral Daily   Jannifer Hick MD [facility] Pager [phone]

## 2020-06-15 DIAGNOSIS — I314 Cardiac tamponade: Secondary | ICD-10-CM | POA: Diagnosis not present

## 2020-06-15 DIAGNOSIS — I509 Heart failure, unspecified: Secondary | ICD-10-CM | POA: Diagnosis not present

## 2020-06-15 DIAGNOSIS — J9601 Acute respiratory failure with hypoxia: Secondary | ICD-10-CM | POA: Diagnosis not present

## 2020-06-15 DIAGNOSIS — N1832 Chronic kidney disease, stage 3b: Secondary | ICD-10-CM | POA: Diagnosis not present

## 2020-06-15 LAB — RENAL FUNCTION PANEL
Albumin: 2.4 g/dL — ABNORMAL LOW (ref 3.5–5.0)
Anion gap: 12 (ref 5–15)
BUN: 69 mg/dL — ABNORMAL HIGH (ref 8–23)
CO2: 22 mmol/L (ref 22–32)
Calcium: 9.6 mg/dL (ref 8.9–10.3)
Chloride: 92 mmol/L — ABNORMAL LOW (ref 98–111)
Creatinine, Ser: 2.96 mg/dL — ABNORMAL HIGH (ref 0.61–1.24)
GFR calc Af Amer: 24 mL/min — ABNORMAL LOW (ref >60)
GFR calc non Af Amer: 21 mL/min — ABNORMAL LOW (ref >60)
Glucose, Bld: 256 mg/dL — ABNORMAL HIGH (ref 70–99)
Phosphorus: 5.9 mg/dL — ABNORMAL HIGH (ref 2.5–4.6)
Potassium: 5.8 mmol/L — ABNORMAL HIGH (ref 3.5–5.1)
Sodium: 126 mmol/L — ABNORMAL LOW (ref 135–145)

## 2020-06-15 LAB — COOXEMETRY PANEL
Carboxyhemoglobin: 1.3 % (ref 0.5–1.5)
Methemoglobin: 0.9 % (ref 0.0–1.5)
O2 Saturation: 73.9 %
Total hemoglobin: 11.9 g/dL — ABNORMAL LOW (ref 12.0–16.0)

## 2020-06-15 LAB — CBC
HCT: 35.8 % — ABNORMAL LOW (ref 39.0–52.0)
Hemoglobin: 11.4 g/dL — ABNORMAL LOW (ref 13.0–17.0)
MCH: 31.1 pg (ref 26.0–34.0)
MCHC: 31.8 g/dL (ref 30.0–36.0)
MCV: 97.5 fL (ref 80.0–100.0)
Platelets: 190 10*3/uL (ref 150–400)
RBC: 3.67 MIL/uL — ABNORMAL LOW (ref 4.22–5.81)
RDW: 15.6 % — ABNORMAL HIGH (ref 11.5–15.5)
WBC: 12.3 10*3/uL — ABNORMAL HIGH (ref 4.0–10.5)
nRBC: 0 % (ref 0.0–0.2)

## 2020-06-15 LAB — GLUCOSE, CAPILLARY
Glucose-Capillary: 211 mg/dL — ABNORMAL HIGH (ref 70–99)
Glucose-Capillary: 203 mg/dL — ABNORMAL HIGH (ref 70–99)
Glucose-Capillary: 210 mg/dL — ABNORMAL HIGH (ref 70–99)

## 2020-06-15 LAB — HEPARIN LEVEL (UNFRACTIONATED): Heparin Unfractionated: <0.1 IU/mL — ABNORMAL LOW (ref 0.30–0.70)

## 2020-06-15 MED ORDER — MIDODRINE HCL 5 MG PO TABS
10.0000 mg | ORAL_TABLET | Freq: Once | ORAL | Status: AC
  Administered 2020-06-15: 10 mg via ORAL
  Filled 2020-06-15: qty 2

## 2020-06-15 MED ORDER — HEPARIN SODIUM (PORCINE) 1000 UNIT/ML IJ SOLN
INTRAMUSCULAR | Status: AC
  Administered 2020-06-15: 1000 [IU] via INTRAVENOUS_CENTRAL
  Filled 2020-06-15: qty 4

## 2020-06-15 MED ORDER — GLYCOPYRROLATE 0.2 MG/ML IJ SOLN: 0.2000 mg | INTRAMUSCULAR | Status: DC | PRN

## 2020-06-15 MED ORDER — HYDROMORPHONE HCL 1 MG/ML IJ SOLN: 0.5000 mg | INTRAMUSCULAR | Status: DC | PRN

## 2020-06-15 NOTE — Progress Notes (Signed)
Daily Progress Note   Patient Name: Robert [name]       Date: 06/15/2020 DOB: 04-06-1951  Age: 69 y.o. MRN#: [id number] Attending Physician: Little Ishikawa, MD Primary Care Physician: Redmond School, MD Admit Date: 05/26/2020  Reason for Consultation/Follow-up: Establishing goals of care  Subjective: Patient does wake to voice. He is drowsy but does wake to voice and answer questions appropriately. Appears very weak. Denies current pain or dyspnea  GOC: Visit with patient following poor toleration of dialysis this morning. Chart reviewed in detail.   Patient states "there is not much more they can do." He understands his diagnoses and poor prognosis. Knowing poor prognosis, discussed his EOL wishes. Patient is adamant that he does not wish to die at home. He would rather die in the hospital. He is at peace and "ready." He declines fears. It is important to see his family and allow his pastor to visit in the hospital. Reassured patient that I would advocate for unrestricted visitor access today while he is still awake and interactive.   Prepared patient for EOL expectations with worsening renal failure. Emphasized focus on comfort medications to ensure he does not suffer or struggle. Patient gives this NP permission to update his children via telephone.   Damaris Schooner with Surveyor, quantity, Lattie Haw. Permission granted for unrestricted visitor access today. I believe today is a good window for family and friends to visit as he continues to decline and he will likely become more uremic in the next few days with worsening kidney function and no further plans for dialysis. Drowsier today. Discussed with RN.   **Spoke with patient's daughter Maudie Mercury) and son [name]). Updated them on dialysis session this  morning. Discussed diagnoses and poor prognosis. Updated them on my conversation with Mr. Brubacher including his wishes to die in the hospital instead of at home. Prepared them that we will continue vasopressor support today but that he may decline quickly once this medication is discontinued. Discussed unrestricted visitor access this afternoon as today is a good window for them to visit while he is still awake and interactive. Family is appreciative and will visit today. Daughter will call pastor. Maudie Mercury and [name] understand poor prognosis and not surprised that he is nearing EOL. They shares that "his body is tired" and they know he is "ready." Answered  questions. Spiritual/emotional support provided. Encouraged them to call this NP with questions or concerns this afternoon. Reassured of ongoing support.    Length of Stay: 27  Current Medications: Scheduled Meds:  . (feeding supplement) PROSource Plus  30 mL Oral BID BM  . amiodarone  200 mg Oral BID  . aspirin  81 mg Oral Daily  . atorvastatin  80 mg Oral Daily  . B-complex with vitamin C  1 tablet Oral Daily  . Chlorhexidine Gluconate Cloth  6 each Topical Daily  . Chlorhexidine Gluconate Cloth  6 each Topical Q0600  . Chlorhexidine Gluconate Cloth  6 each Topical Q0600  . dextromethorphan-guaiFENesin  1 tablet Oral BID  . feeding supplement (NEPRO CARB STEADY)  237 mL Oral BID BM  . fluticasone  1 spray Each Nare Daily  . folic acid  1 mg Oral Daily  . gabapentin  300 mg Oral BID  . influenza vaccine adjuvanted  0.5 mL Intramuscular Tomorrow-1000  . insulin aspart  0-20 Units Subcutaneous TID WC  . insulin aspart  12 Units Subcutaneous TID WC  . insulin detemir  30 Units Subcutaneous BID  . mouth rinse  15 mL Mouth Rinse BID  . melatonin  3 mg Oral QHS  . midodrine  15 mg Oral TID WC  . mometasone-formoterol  2 puff Inhalation BID  . primidone  100 mg Oral QHS  . tamsulosin  0.4 mg Oral Daily    Continuous Infusions: . sodium  chloride    . sodium chloride    . norepinephrine (LEVOPHED) Adult infusion 8 mcg/min (06/15/20 1033)    PRN Meds: sodium chloride, sodium chloride, acetaminophen, albuterol, alteplase, camphor-menthol, heparin, lidocaine (PF), lidocaine-prilocaine, loperamide, Muscle Rub, ondansetron (ZOFRAN) IV, oxyCODONE, pentafluoroprop-tetrafluoroeth, sodium chloride flush  Physical Exam Vitals and nursing note reviewed.  Constitutional:      General: He is awake.     Appearance: He is ill-appearing.  HENT:     Head: Normocephalic and atraumatic.  Cardiovascular:     Rate and Rhythm: Rhythm irregularly irregular.  Pulmonary:     Effort: No tachypnea, accessory muscle usage or respiratory distress.  Abdominal:     Tenderness: There is no abdominal tenderness.  Skin:    General: Skin is warm and dry.  Neurological:     Mental Status: He is alert and oriented to person, place, and time.  Psychiatric:        Mood and Affect: Mood normal.        Speech: Speech normal.        Behavior: Behavior normal.        Cognition and Memory: Cognition normal.             Vital Signs: BP (!) 91/56   Pulse 89   Temp 99.3 F (37.4 C)   Resp (!) 22   Ht 6' (1.829 m)   Wt 93.6 kg   SpO2 100%   BMI 27.99 kg/m  SpO2: SpO2: 100 % O2 Device: O2 Device: Nasal Cannula O2 Flow Rate: O2 Flow Rate (L/min): 6 L/min  Intake/output summary:   Intake/Output Summary (Last 24 hours) at 06/15/2020 1148 Last data filed at 06/15/2020 1015 Gross per 24 hour  Intake 773.75 ml  Output 1869 ml  Net -1095.25 ml   LBM: Last BM Date: 06/13/20 Baseline Weight: Weight: 108.4 kg Most recent weight: Weight: 93.6 kg       Palliative Assessment/Data: PPS 30%    Flowsheet Rows     Most Recent Value  Intake Tab  Referral Department Hospitalist  Unit at Time of Referral ICU  Palliative Care Primary Diagnosis Cardiac  Date Notified 06/08/20  Palliative Care Type New Palliative care  Reason for referral Clarify  Goals of Care  Date of Admission 05/12/2020  Date first seen by Palliative Care 06/10/20  # of days Palliative referral response time 2 Day(s)  # of days IP prior to Palliative referral 20  Clinical Assessment  Psychosocial & Spiritual Assessment  Palliative Care Outcomes      Patient Active Problem List   Diagnosis Date Noted  . DNR (do not resuscitate)   . Palliative care by specialist   . Pericardial tamponade 05/26/2020  . Pericardial effusion   . Acute on chronic congestive heart failure ([place]) 05/30/2020  . Abnormality of gait 10/02/2019  . Leg length discrepancy 10/02/2019  . Primary osteoarthritis of right knee 08/28/2019  . Chronic pain of both knees 08/04/2019  . Acute on chronic combined systolic and diastolic CHF (congestive heart failure) ([place]) 07/05/2019  . Aspiration pneumonia ([place]) 07/04/2019  . Unspecified atrial fibrillation ([place]) 07/04/2019  . Anemia 07/04/2019  . Aspiration pneumonitis ([place]) 07/04/2019  . Acute respiratory failure with hypoxia ([place])   . CKD (chronic kidney disease), stage III   . Controlled type 2 diabetes mellitus with hyperglycemia, without long-term current use of insulin ([place])   . Urinary retention   . 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 Hypokalemia   . Physical debility 05/22/2019  . Demand ischemia ([place]) 05/20/2019  . Flash pulmonary edema ([place]) 05/20/2019  . Stage II decubitus ulcer ([place]) 05/18/2019  . Aspiration pneumonia of both lower lobes due to gastric secretions ([place]) 05/15/2019  . MRSA pneumonia ([place]) 05/15/2019  . Severe sepsis with septic shock ([place]) 05/15/2019  . NSTEMI (non-ST elevated myocardial infarction) ([place]) 05/15/2019  . Staphylococcal pneumonia ([place]) 04/20/2019  . HCAP (healthcare-associated pneumonia) 04/20/2019  . Pneumonia due to COVID-19 virus 04/18/2019  . COPD (chronic obstructive pulmonary disease) with emphysema ([place]) 04/18/2019  . AKI (acute kidney injury) ([place]) 04/18/2019  . Diabetes mellitus type  2, uncontrolled, with complications ([place]) 93/79/0240  . Pressure injury of skin 04/18/2019  . Atrial fibrillation with RVR ([place]) 04/15/2019  . COVID-19 virus detected 04/12/2019  . Acute respiratory distress syndrome (ARDS) due to COVID-19 virus ([place]) 04/12/2019  . Hypoxia   . Sepsis ([place])   . PAD (peripheral artery disease) ([place]) 03/27/2019  . Pulmonary edema 08/27/2018  . Acute respiratory failure with hypoxemia ([place]) 08/27/2018  . Chronic combined systolic and diastolic heart failure ([place]) 08/27/2018  . Dyslipidemia (high LDL; low HDL) 03/29/2018  . Dysphagia 01/02/2018  . Diarrhea 01/02/2018  . Family history of colon cancer 01/02/2018  . Ischemic cardiomyopathy   . Insulin dependent diabetes mellitus   . Hypertension   . History of MI (myocardial infarction)   . GERD (gastroesophageal reflux disease)   . Goals of care, counseling/discussion   . Coronary artery disease   . Anxiety   . Acute ST elevation myocardial infarction (STEMI) of inferior wall ([place]) 06/06/2017  . Varicose veins of left lower extremity with complications 97/35/3299  . Cellulitis of left leg without foot   . Right radial fracture 12/11/2016  . T3 vertebral fracture ([place]) 12/11/2016  . Fall 12/09/2016  . Thrombocytopenia ([place]) 10/09/2016  . Left leg cellulitis 10/08/2016  . Diabetes ([place]) 10/08/2016  . Inguinal hernia 05/12/2016  . Incarcerated umbilical hernia 24/26/8341  . Chronic combined systolic and diastolic CHF (congestive  heart failure) ([place]) 04/25/2016  . High cholesterol   . Controlled type 2 diabetes mellitus with diabetic peripheral angiopathy without gangrene, with long-term current use of insulin ([employer])   . Essential hypertension   . Non-STEMI (non-ST elevated myocardial infarction) ([place])   . CAD S/P percutaneous coronary angioplasty   . Unstable angina ([place]) 03/20/2015  . ICM-EF 35% at cath 02/01/15 02/02/2015  . DM type 2 causing vascular disease, not at goal [facility]) 05/29/2014  . Dyspnea  10/20/2013  . COPD (chronic obstructive pulmonary disease) ([place]) 09/03/2013  . Hyperlipidemia   . Old myocardial infarction 09/01/2013  . Abnormal nuclear stress test 05/17/2012  . S/P CFX PTCAI for ISR 02/01/15 05/17/2012  . PVD, chronic LLE 12/22/2011  . Rheumatoid arthritis ([place]) 12/22/2011  . Contrast media allergy 12/22/2011  . Tobacco abuse 12/21/2011    Palliative Care Assessment & Plan   Patient Profile: 69 y.o. male   admitted on 05/21/2020 with a past medical history  of insulin-dependent diabetes mitis, HTN, CAD/MI s/p multiple interventions, chronic combined systolic and diastolic CHF, CKD 3 COPD, peripheral neuropathy, rheumatoid arthritis.  Patient presented to the ED on9/8/2021with a week history of weight gain, shortness of breath. He was in respiratory failure. Chest x-ray showed pulmonary edema. He was admitted for acute exacerbation of CHF.  9/9, echocardiogram showed new systolic diastolic cardiomyopathy with an EF of 35 to 40%.   9/13, repeat echo showed circumferential pericardial effusion and signs of tamponade. Underwent pericardiocentesis by CT surgery.  Patient was continued on maximal dose of IV diuretics with tapering urine output. His oxygen requirement worsened. Creatinine worsened.   9/17, Chest x-ray showed large right-sided pleural effusion as well as pulmonary vascular congestion.   10/4: Off CRRT but remains oliguric. Plan is to try intermittent HD (or await renal recovery). HF team and nephrology concerned about patient's ability to tolerate iHD. Prognosis remains guarded with tenuous clinical condition.   10/5: Patient did not tolerate iHD. Remains on NE. No further dialysis.   Assessment: Acute on chronic systolic heart failure Ischemic cardiomyopathy with EF 35-40% Pericardial effusion s/p pericardiocentesis AKI, likely cardiorenal syndrome Afib/flutter LE wounds, venous stasis with  PAD Thrombocytopenia Oliguria  Recommendations/Plan: Unfortunately, patient did not tolerate iHD this morning. No plans for further HD. Likely transition to comfort measures in the next few days if renal function continues to decline.  Continue NE for now. Allow unrestricted visitor access today with concern he will be more uremic tomorrow. Discussed and approved by unit director. Updated RN.  Start prn low dose dilaudid for pain/dyspnea and robinul for secretions if patient becomes symptomatic.  Updated family via telephone. They plan to visit today and will contact his pastor for EOL visit. Patient/family understand poor prognosis. Patient does not want to die at home. Would prefer to remain hospitalized.  PMT will continue to follow and support patient/family.    Code Status: DNR   Code Status Orders  (From admission, onward)         Start     Ordered   06/10/20 1633  Do not attempt resuscitation (DNR)  Continuous       Question Answer Comment  In the event of cardiac or respiratory ARREST Do not call a "code blue"   In the event of cardiac or respiratory ARREST Do not perform Intubation, CPR, defibrillation or ACLS   In the event of cardiac or respiratory ARREST Use medication by any route, position, wound care, and other measures to relive pain and  suffering. May use oxygen, suction and manual treatment of airway obstruction as needed for comfort.      06/10/20 1633        Code Status History    Date Active Date Inactive Code Status Order ID Comments User Context   06/08/2020 1658 06/10/2020 1633 Full Code [id number]  Bonnielee Haff, MD ED   07/04/2019 0215 07/07/2019 1853 Full Code [id number]  Reubin Milan, MD ED   05/22/2019 1922 06/07/2019 1224 Full Code [id number]  Bary Leriche, PA-C Inpatient   04/12/2019 0646 05/22/2019 1855 Full Code [id number]  [place], [place], DO Inpatient   08/27/2018 0243 08/28/2018 1830 Full Code [id number]  Oswald Hillock, MD Inpatient    06/06/2017 2355 06/11/2017 2011 Full Code [id number]  Belva Crome, MD Inpatient   03/03/2017 1947 03/05/2017 1741 Full Code [id number]  Truett Mainland, DO ED   12/09/2016 1624 12/13/2016 1854 Full Code [id number]  Greer Pickerel, MD Inpatient   10/08/2016 1600 10/09/2016 1544 Full Code [id number]  Jule Ser, DO Inpatient   03/22/2015 0844 03/23/2015 1644 Full Code [id number]  Leonie Man, MD Inpatient   03/20/2015 1507 03/22/2015 0844 Full Code [id number]  Jerline Pain, MD Inpatient   02/01/2015 0935 02/02/2015 1420 Full Code [id number]  Belva Crome, MD Inpatient   09/19/2013 0109 09/19/2013 1500 Full Code [id number]  Cletus Gash, MD Inpatient   09/01/2013 2125 09/03/2013 1610 Full Code [id number]  Robet Leu Inpatient   12/21/2011 2337 12/23/2011 1227 Full Code [id number]  Ferne Coe, RN Inpatient   Advance Care Planning Activity      Prognosis: Poor prognosis  Discharge Planning: To Be Determined: anticipate hospital death once life-prolonging interventions are discontinued.  Care plan was discussed with RN, patient, family (son and daughter), updated care team via secure epic chat  Thank you for allowing the Palliative Medicine Team to assist in the care of this patient.   Total Time 45 Prolonged Time Billed no    Greater than 50% of this time was spent counseling and coordinating care related to the above assessment and plan.   Ihor Dow, DNP, FNP-C Palliative Medicine Team  Phone: [phone] Fax: [phone]  Please contact Palliative Medicine Team phone at [phone] for questions and concerns.

## 2020-06-15 NOTE — Progress Notes (Addendum)
Patient ID: Robert [name], male   DOB: 07/09/1951, 69 y.o.   MRN: [id number] [facility] Progress Note   Assessment/ Plan:   1. Acute kidney Injury on chronic kidney disease stage IIIb:  AKI secondary to acute exacerbation of congestive heart failure in the setting of pericardial effusion.  With significant net negative fluid balance of 28 L this hospitalization. At this point we are holding CRRT and attempting a trial of IHD.  In order to simulate a full HD treatment as it would be outpt we have turned off the NE gtt --> he's about 50% through his treatment currently so I will give an additional midodrine 10mg  po now as this is feasible outpt.  If unable to tolerate intermittent hemodialysis will need to transition to palliative care/conservative management.   2.  Acute exacerbation of systolic heart failure: Failed to respond to diuretics and has tolerated extracorporeal fluid removal with CRRT/UF, per above trial of HD.  Remains on Levophed intravenously and oral midodrine and per above we've turned off the leophed.  I'm concerned his hemodynamics will not tolerate HD. 3.  Pericardial effusion: Hemorrhagic and will require additional evaluation for malignancy when stable.  Initial cytology negative. 4.  Hyponatremia: Secondary to CHF exacerbation/acute kidney injury; continue to monitor with RRT.  On fluid restriction 1.2 L/day. 5.  Anemia: Secondary to chronic kidney disease and possibly exacerbated by acute illness/CHF decompensation.  Monitor for overt blood loss and will give intravenous iron/ESA. 6.  Hyperkalemia:  K 5.8 this AM, HD now. 7. Hypervolemic hyponatremia:  UF with HD.  ADDENDUM:  Saw during attempt at HD without IV vasopressor support --> BP into the 60s then 70s despite midodrine.  Pt felt terrible.  Dr. Haroldine Laws and myself had a lengthy conversation to continue what Robert [name] discussed with palliative care yesterday.  He decided to complete today's dialysis with  addition of vasopressors so he could tolerate and then forgo future dialysis treatments.  If his residual renal function does not support him he will fully transition to comfort measures in the coming days.  He is going to visit with family and is looking forward to that.  He seems at peace with his decision.  Call if I can help in anyway, but I'll follow peripherally now.  Subjective:   Seen on HD today - he says it really makes him fatigued and he tolerates it poorly.  He's think about the palliative care meeting yesterday - has an understanding of the issues at hand regarding tolerance of hemodialysis.    Objective:   BP (!) 69/50   Pulse 91   Temp 99.3 F (37.4 C) (Oral)   Resp 20   Ht 6' (1.829 m)   Wt 94.2 kg   SpO2 94%   BMI 28.17 kg/m   Intake/Output Summary (Last 24 hours) at 06/15/2020 0851 Last data filed at 06/15/2020 0600 Gross per 24 hour  Intake 763.17 ml  Output 350 ml  Net 413.17 ml   Weight change: 3.7 kg  Physical Exam: Gen: awake but looks much more fatigued and a bit uncomfortable CVS: Pulse regular rhythm and normal rate, distant S1-S2 Resp: Diminished breath sounds over bases, no distinct rales Abd: Soft, obese, nontender Ext: Lower extremities in [place] boots Dialysis access: RID temp HD catheter  Imaging: DG CHEST PORT 1 VIEW  Result Date: 06/14/2020 CLINICAL DATA:  Central line placement. EXAM: PORTABLE CHEST 1 VIEW COMPARISON:  Single-view of the chest earlier today. FINDINGS: New right  IJ central venous catheter tip projects just above the superior cavoatrial junction. Left IJ catheter is unchanged. No pneumothorax. Right worse than left airspace disease effusions are unchanged. Cardiomegaly. Atherosclerosis. IMPRESSION: New right IJ catheter projects just above the superior cavoatrial junction. No pneumothorax. No change in right worse than left airspace disease and effusions. Electronically Signed   By: Inge Rise M.D.   On: 06/14/2020 12:56    DG CHEST PORT 1 VIEW  Result Date: 06/14/2020 CLINICAL DATA:  Evaluate central line EXAM: PORTABLE CHEST 1 VIEW COMPARISON:  05/31/2020 FINDINGS: Slight retraction of the left IJ line with tip near the brachiocephalic SVC confluence. Interstitial and airspace opacity which has progressed. There is history of CHF and hypotension. Cardiomegaly. Coronary stenting. No visible effusion or pneumothorax IMPRESSION: 1. Slight shortening of the left IJ line, tip at the brachiocephalic SVC confluence. 2. Progressive pulmonary opacification. Electronically Signed   By: Monte Fantasia M.D.   On: 06/14/2020 09:10    Labs: BMET Recent Labs  Lab 06/11/20 1631 06/12/20 0351 06/12/20 1606 06/13/20 0313 06/13/20 1932 06/14/20 0330 06/15/20 0205  NA 132* 131* 131* 129* 130* 131*  130* 126*  K 4.7 4.8 4.7 5.2* 5.1 5.5*  5.3* 5.8*  CL 99 98 96* 95* 96* 96*  97* 92*  CO2 25 24 24 25 24 29  25 22   GLUCOSE 210* 91 243* 207* 276* 168*  171* 256*  BUN 29* 27* 35* 36* 41* 35*  37* 69*  CREATININE 1.58* 1.51* 1.90* 2.20* 2.03* 2.00*  1.95* 2.96*  CALCIUM 8.7* 8.7* 8.6* 8.9 8.5* 8.8*  8.8* 9.6  PHOS 2.8 2.9 3.5 3.7 3.6 3.4 5.9*   CBC Recent Labs  Lab 06/09/20 0405 06/09/20 1804 06/12/20 0351 06/13/20 0313 06/14/20 0330 06/15/20 0205  WBC 14.2*   < > 11.1* 10.5 11.4* 12.3*  NEUTROABS 11.8*  --   --   --   --   --   HGB 10.1*   < > 10.6* 10.6* 10.9* 11.4*  HCT 31.7*   < > 33.2* 35.6* 35.1* 35.8*  MCV 95.8   < > 97.4 99.4 98.9 97.5  PLT 106*   < > 94* 91* 123* 190   < > = values in this interval not displayed.   Medications:    . (feeding supplement) PROSource Plus  30 mL Oral BID BM  . amiodarone  200 mg Oral BID  . aspirin  81 mg Oral Daily  . atorvastatin  80 mg Oral Daily  . B-complex with vitamin C  1 tablet Oral Daily  . Chlorhexidine Gluconate Cloth  6 each Topical Daily  . Chlorhexidine Gluconate Cloth  6 each Topical Q0600  . Chlorhexidine Gluconate Cloth  6 each Topical  Q0600  . dextromethorphan-guaiFENesin  1 tablet Oral BID  . feeding supplement (NEPRO CARB STEADY)  237 mL Oral BID BM  . fluticasone  1 spray Each Nare Daily  . folic acid  1 mg Oral Daily  . gabapentin  300 mg Oral BID  . heparin sodium (porcine)      . influenza vaccine adjuvanted  0.5 mL Intramuscular Tomorrow-1000  . insulin aspart  0-20 Units Subcutaneous TID WC  . insulin aspart  12 Units Subcutaneous TID WC  . insulin detemir  30 Units Subcutaneous BID  . mouth rinse  15 mL Mouth Rinse BID  . melatonin  3 mg Oral QHS  . midodrine  10 mg Oral Once  . midodrine  15 mg Oral TID  WC  . mometasone-formoterol  2 puff Inhalation BID  . primidone  100 mg Oral QHS  . tamsulosin  0.4 mg Oral Daily   Jannifer Hick MD [facility] Pager [phone]

## 2020-06-15 NOTE — Progress Notes (Signed)
PROGRESS NOTE  Robert [name]  DOB: 1950/12/19  PCP: Redmond School, MD VOJ:[id number]  DOA: 06/07/2020  LOS: 27 days   Chief Complaint  Patient presents with  . Shortness of Breath   Brief narrative: Robert [name] is a 69 y.o. male with PMH of insulin-dependent diabetes mitis, HTN, CAD/MI s/p multiple interventions, chronic combined systolic and diastolic CHF, CKD 3 COPD, peripheral neuropathy, rheumatoid arthritis. Patient presented to the ED on 05/21/2020 with a week history of weight gain, shortness of breath.  He was in respiratory failure.  Chest x-ray showed pulmonary edema. He was admitted for acute exacerbation of CHF.  9/9, echocardiogram showed new systolic diastolic cardiomyopathy with an EF of 35 to 40%.   9/13, repeat echo showed circumferential pericardial effusion and signs of tamponade.  Underwent pericardiocentesis by CT surgery. Patient was continued on maximal dose of IV diuretics with tapering urine output.  His oxygen requirement worsened. Creatinine worsened.  9/17, Chest x-ray showed large right-sided pleural effusion as well as pulmonary vascular congestion.  Critical care and nephrology consultation were obtained.  HD catheter was placed.  Patient was started on CRRT. 10/3, issue with CRRT cartridge leak - likely to resume in the next 24h - discussion with nephro/family on 10/4 about long term planning HD vs palliative if unable to tolerate 10/4 - Overnight issues with central line - likely need to be replaced 10/5 -not tolerating dialysis very well, patient is being set up with palliative care per previous documentation  Subjective: No acute issues or events overnight, tolerating dialysis quite poorly this morning back on Levophed feeling quite poor despite pressure support, patient will further discuss hospice and palliative care with family  Assessment/Plan: Principal Problem:   Pericardial effusion Active Problems:   Hyperlipidemia   COPD (chronic  obstructive pulmonary disease) ([place])   Controlled type 2 diabetes mellitus with diabetic peripheral angiopathy without gangrene, with long-term current use of insulin ([place])   Essential hypertension   Goals of care, counseling/discussion   Acute respiratory failure with hypoxemia (HCC)   COPD (chronic obstructive pulmonary disease) with emphysema ([place])   CKD (chronic kidney disease), stage III   Controlled type 2 diabetes mellitus with hyperglycemia, without long-term current use of insulin (HCC)   Acute on chronic combined systolic and diastolic CHF (congestive heart failure) (HCC)   Acute on chronic congestive heart failure (HCC)   Pericardial tamponade   DNR (do not resuscitate)   Palliative care by specialist   Goals of care -Patient tolerating dialysis poorly, as previously discussed with cardiology, nephrology and palliative care we will continue to advance patient towards hospice care, disposition pending further needs, will discuss specifics with case management and palliative care.  Family present at meeting yesterday understood that this was a high likelihood given his poor clinical status and advancing cardiorenal disease.  Uncontrolled diabetes mellitus 2 with peripheral neuropathy - A1c 7.3 on 9/9 - Well controlled on Lantus 30 units twice daily, scheduled NovoLog Premeal at 12 units 3 times daily and sliding-scale insulin.  Continue Accu-Cheks - Continue Neurontin 300 mg 3 times daily. - Diabetes care coordinator consult appreciated. Recent Labs  Lab 06/13/20 2157 06/14/20 0645 06/14/20 1112 06/14/20 1640 06/14/20 2208  GLUCAP 205* 189* 262* 169* 271*   Acute hypoxemic respiratory failure, improving - Multifactorial: CHF, pleural effusion, pericardial effusion as below SpO2: 98 % O2 Flow Rate (L/min): 6 L/min  Acute exacerbation chronic combined systolic and diastolic CHF Essential hypertension - EF 35 to 40% - Patient  remains volume overloaded.  CRRT currently on  hold due to line issues, attempt to transition to hemodialysis as below.   - Heart failure team following -appreciate insight and recommendations.   AKI on CKD stage IIIb - Likely cardiorenal.  Nephrology following.  Patient is on CRRT.  Creatinine trend as below. Recent Labs    06/10/20 0426 06/10/20 1600 06/11/20 0318 06/11/20 1631 06/12/20 0351 06/12/20 1606 06/13/20 0313 06/13/20 1932 06/14/20 0330 06/15/20 0205  BUN 23 25* 26* 29* 27* 35* 36* 41* 35*  37* 69*  CREATININE 1.59* 1.37* 1.54* 1.58* 1.51* 1.90* 2.20* 2.03* 2.00*  1.95* 2.96*   Bilateral pleural effusions - Improved pleural effusion on chest x-ray repeated on 9/20.  Pericardial effusion w/ tamponade  - Status post pericardiocentesis on 9/13, stable  A. fib, rate controlled - On amiodarone.  Remains in A. fib.   - Currently off anticoagulation because of hemorrhagic pericarditis. - Not a candidate for DCCV per cardiology.  Hyperkalemia/hyperphosphatemia, stable - Related to renal failure.   - Trend as below Recent Labs  Lab 06/10/20 0426 06/10/20 1600 06/11/20 0318 06/11/20 1631 06/12/20 0351 06/12/20 0351 06/12/20 1606 06/13/20 0313 06/13/20 1932 06/14/20 0330 06/15/20 0205  K 4.7   < > 4.9   < > 4.8   < > 4.7 5.2* 5.1 5.5*  5.3* 5.8*  MG 2.4  --  2.4  --  2.6*  --   --  2.7*  --  2.6*  --   PHOS 2.7   < > 2.6   < > 2.9   < > 3.5 3.7 3.6 3.4 5.9*   < > = values in this interval not displayed.   Hyponatremia, improving -Improving with CRRT, defer to nephrology for further management Recent Labs  Lab 06/10/20 0426 06/10/20 1600 06/11/20 0318 06/11/20 1631 06/12/20 0351 06/12/20 1606 06/13/20 0313 06/13/20 1932 06/14/20 0330 06/15/20 0205  NA 132* 133* 130* 132* 131* 131* 129* 130* 131*  130* 126*   Thrombocytopenia -Platelet level running low but fluctuating.  PF4 antibody sent. No immediate indication of argatroban. -Platelet level is up to 106 today. Recent Labs  Lab  06/09/20 0405 06/10/20 0426 06/11/20 0318 06/12/20 0351 06/13/20 0313 06/14/20 0330 06/15/20 0205  PLT 106* 93* 106* 94* 91* 123* 190   Elevated liver enzymes, minimally improving -Improving.  Negative acute hepatitis panel. -Repeat liver function panel q72h x2 Recent Labs  Lab 06/10/20 1200 06/10/20 1600 06/12/20 1606 06/13/20 0313 06/13/20 1932 06/14/20 0330 06/15/20 0205  AST 101*  --   --   --   --  67*  --   ALT 151*  --   --   --   --  139*  --   ALKPHOS 211*  --   --   --   --  257*  --   BILITOT 0.6  --   --   --   --  0.4  --   PROT 6.3*  --   --   --   --  6.8  --   ALBUMIN 2.6*   < > 2.5* 2.6* 2.6* 2.6*  2.6* 2.4*   < > = values in this interval not displayed.   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  Pressure injury of skin  - Per documentation of wound care/nursing  Muscular deconditioning - PT/OT consults  Mobility:  PT OT eval. encourage ambulation.   Code Status:   Code Status: DNR  Nutritional status: Body mass index is 28.17 kg/m. Nutrition Problem: Increased nutrient needs Etiology: acute illness (AKI on CRRT) Signs/Symptoms: estimated needs Diet Order            Diet Carb Modified Fluid consistency: Thin; Room service appropriate? Yes; Fluid restriction: 1200 mL Fluid  Diet effective now                 DVT prophylaxis: Place and maintain sequential compression device Start: 06/03/20 0900 Place TED hose Start: 05/31/20 0933 SCD's Start: 05/28/2020 1942   Antimicrobials:  None Fluid: None Consultants: Nephrology, cardiology Family Communication:  None present  Status is: Inpatient Remains inpatient appropriate because: Patient is on CRRT transitioning to HD  Dispo: The patient is from: Home              Anticipated d/c is to: With hospice, home versus hospice house  pending              Anticipated d/c date is: 24 to 48 hours              Patient currently is medically stable for discharge as we transition to hospice, will likely discontinue dialysis, wean off pressors as able and set up for disposition in the next few days pending patient and family discussion for placement at home with hospice versus hospice house.  Infusions:  . sodium chloride    . sodium chloride    . norepinephrine (LEVOPHED) Adult infusion 7 mcg/min (06/15/20 0622)    Scheduled Meds: . (feeding supplement) PROSource Plus  30 mL Oral BID BM  . amiodarone  200 mg Oral BID  . aspirin  81 mg Oral Daily  . atorvastatin  80 mg Oral Daily  . B-complex with vitamin C  1 tablet Oral Daily  . Chlorhexidine Gluconate Cloth  6 each Topical Daily  . Chlorhexidine Gluconate Cloth  6 each Topical Q0600  . Chlorhexidine Gluconate Cloth  6 each Topical Q0600  . dextromethorphan-guaiFENesin  1 tablet Oral BID  . feeding supplement (NEPRO CARB STEADY)  237 mL Oral BID BM  . fluticasone  1 spray Each Nare Daily  . folic acid  1 mg Oral Daily  . gabapentin  300 mg Oral BID  . heparin sodium (porcine)      . influenza vaccine adjuvanted  0.5 mL Intramuscular Tomorrow-1000  . insulin aspart  0-20 Units Subcutaneous TID WC  . insulin aspart  12 Units Subcutaneous TID WC  . insulin detemir  30 Units Subcutaneous BID  . mouth rinse  15 mL Mouth Rinse BID  . melatonin  3 mg Oral QHS  . midodrine  15 mg Oral TID WC  . mometasone-formoterol  2 puff Inhalation BID  . primidone  100 mg Oral QHS  . tamsulosin  0.4 mg Oral Daily    Antimicrobials: Anti-infectives (From admission, onward)   None      PRN meds: sodium chloride, sodium chloride, acetaminophen, albuterol, alteplase, camphor-menthol, heparin, lidocaine (PF), lidocaine-prilocaine, loperamide, Muscle Rub, ondansetron (ZOFRAN) IV, oxyCODONE, pentafluoroprop-tetrafluoroeth, sodium chloride flush   Objective: Vitals:   06/15/20 0700  06/15/20 0707  BP: 92/63 (!) 94/59  Pulse: 84 86  Resp: (!) 25 (!) 26  Temp: 99.3 F (37.4 C)   SpO2: 100% 98%    Intake/Output Summary (Last 24 hours) at 06/15/2020 0732 Last data filed at 06/15/2020 0600 Gross per 24 hour  Intake 1010.02  ml  Output 350 ml  Net 660.02 ml   Filed Weights   06/14/20 0500 06/15/20 0600 06/15/20 0700  Weight: 94.4 kg 98.1 kg 94.2 kg   Weight change: 3.7 kg Body mass index is 28.17 kg/m.   Physical Exam: General exam: Appears fatigued, minimally tachypneic, somewhat lethargic but ANO x4 Skin: No rashes, lesions or ulcers. HEENT: Atraumatic, normocephalic, large venous catheter right IJ bandage clean dry intact Lungs: Diminished air entry in bases. Remains on supplemental oxygen. CVS: Rate controlled A. fib  GI/Abd soft, nontender, nondistended, bowel sound present CNS: Alert, awake, oriented to place and person Psychiatry: Mood appropriate, somewhat depressed Extremities: 2+ pedal edema bilaterally.  No calf tenderness  Data Review: I have personally reviewed the laboratory data and studies available.  Recent Labs  Lab 06/09/20 0405 06/09/20 1804 06/11/20 0318 06/12/20 0351 06/13/20 0313 06/14/20 0330 06/15/20 0205  WBC 14.2*   < > 11.8* 11.1* 10.5 11.4* 12.3*  NEUTROABS 11.8*  --   --   --   --   --   --   HGB 10.1*   < > 10.1* 10.6* 10.6* 10.9* 11.4*  HCT 31.7*   < > 32.1* 33.2* 35.6* 35.1* 35.8*  MCV 95.8   < > 95.8 97.4 99.4 98.9 97.5  PLT 106*   < > 106* 94* 91* 123* 190   < > = values in this interval not displayed.   Recent Labs  Lab 06/10/20 0426 06/10/20 1600 06/11/20 0318 06/11/20 1631 06/12/20 0351 06/12/20 0351 06/12/20 1606 06/13/20 0313 06/13/20 1932 06/14/20 0330 06/15/20 0205  NA 132*   < > 130*   < > 131*   < > 131* 129* 130* 131*  130* 126*  K 4.7   < > 4.9   < > 4.8   < > 4.7 5.2* 5.1 5.5*  5.3* 5.8*  CL 98   < > 98   < > 98   < > 96* 95* 96* 96*  97* 92*  CO2 27   < > 25   < > 24   < > 24 25 24 29   25 22   GLUCOSE 111*   < > 181*   < > 91   < > 243* 207* 276* 168*  171* 256*  BUN 23   < > 26*   < > 27*   < > 35* 36* 41* 35*  37* 69*  CREATININE 1.59*   < > 1.54*   < > 1.51*   < > 1.90* 2.20* 2.03* 2.00*  1.95* 2.96*  CALCIUM 8.6*   < > 8.4*   < > 8.7*   < > 8.6* 8.9 8.5* 8.8*  8.8* 9.6  MG 2.4  --  2.4  --  2.6*  --   --  2.7*  --  2.6*  --   PHOS 2.7   < > 2.6   < > 2.9   < > 3.5 3.7 3.6 3.4 5.9*   < > = values in this interval not displayed.    Signed, Little Ishikawa, DO [facility]  Pager: Epic chat  06/15/2020

## 2020-06-15 NOTE — Progress Notes (Signed)
Patient ID: Robert [name], male   DOB: 09/03/1951, 69 y.o.   MRN: [id number]     Advanced Heart Failure Rounding Note  PCP-Cardiologist: Robert Klein, MD   Subjective:    Was on NE 8 this am and midodrine 15 tid. NE turned off. Started on iHD and BP dropped quickly into the 70s. Felt terrible.   Feels weak and SOB.   Objective:   Weight Range: 94.2 kg Body mass index is 28.17 kg/m.   Vital Signs:   Temp:  [98.7 F (37.1 C)-99.3 F (37.4 C)] 99.3 F (37.4 C) (10/05 0700) Pulse Rate:  [42-109] 85 (10/05 1009) Resp:  [15-38] 21 (10/05 1009) BP: (69-117)/(34-100) 96/60 (10/05 1009) SpO2:  [60 %-100 %] 100 % (10/05 1009) Weight:  [94.2 kg-98.1 kg] 94.2 kg (10/05 0700) Last BM Date: 06/13/20  Weight change: Filed Weights   06/14/20 0500 06/15/20 0600 06/15/20 0700  Weight: 94.4 kg 98.1 kg 94.2 kg    Intake/Output:   Intake/Output Summary (Last 24 hours) at 06/15/2020 1017 Last data filed at 06/15/2020 0600 Gross per 24 hour  Intake 749.57 ml  Output 350 ml  Net 399.57 ml      Physical Exam    General:  In bed. WEak appearing. No resp difficulty HEENT: normal Neck: supple. RIj trialysis Carotids 2+ bilat; no bruits. No lymphadenopathy or thryomegaly appreciated. Cor: PMI nondisplaced. Irregular rate & rhythm. No rubs, gallops or murmurs. Lungs: clear Abdomen: soft, nontender, + distended. No hepatosplenomegaly. No bruits or masses. Good bowel sounds. Extremities: no cyanosis, clubbing, rash, 1-2+ edema + UNNA Neuro: alert & orientedx3, cranial nerves grossly intact. moves all 4 extremities w/o difficulty. Affect pleasant   Telemetry    A fib 80s Personally reviewed   Labs    CBC Recent Labs    06/14/20 0330 06/15/20 0205  WBC 11.4* 12.3*  HGB 10.9* 11.4*  HCT 35.1* 35.8*  MCV 98.9 97.5  PLT 123* 254   Basic Metabolic Panel Recent Labs    06/13/20 0313 06/13/20 1932 06/14/20 0330 06/15/20 0205  NA 129*   < > 131*  130* 126*  K 5.2*   < >  5.5*  5.3* 5.8*  CL 95*   < > 96*  97* 92*  CO2 25   < > 29  25 22   GLUCOSE 207*   < > 168*  171* 256*  BUN 36*   < > 35*  37* 69*  CREATININE 2.20*   < > 2.00*  1.95* 2.96*  CALCIUM 8.9   < > 8.8*  8.8* 9.6  MG 2.7*  --  2.6*  --   PHOS 3.7   < > 3.4 5.9*   < > = values in this interval not displayed.   Liver Function Tests Recent Labs    06/14/20 0330 06/15/20 0205  AST 67*  --   ALT 139*  --   ALKPHOS 257*  --   BILITOT 0.4  --   PROT 6.8  --   ALBUMIN 2.6*  2.6* 2.4*   No results for input(s): LIPASE, AMYLASE in the last 72 hours. Cardiac Enzymes No results for input(s): CKTOTAL, CKMB, CKMBINDEX, TROPONINI in the last 72 hours.  BNP: BNP (last 3 results) Recent Labs    07/03/19 2313 05/16/20 1228 06/06/2020 1310  BNP 346.0* 149.0* 157.0*    ProBNP (last 3 results) No results for input(s): PROBNP in the last 8760 hours.   D-Dimer No results for input(s): DDIMER in the last  72 hours. Hemoglobin A1C No results for input(s): HGBA1C in the last 72 hours. Fasting Lipid Panel No results for input(s): CHOL, HDL, LDLCALC, TRIG, CHOLHDL, LDLDIRECT in the last 72 hours. Thyroid Function Tests No results for input(s): TSH, T4TOTAL, T3FREE, THYROIDAB in the last 72 hours.  Invalid input(s): FREET3  Other results:   Imaging    DG CHEST PORT 1 VIEW  Result Date: 06/14/2020 CLINICAL DATA:  Central line placement. EXAM: PORTABLE CHEST 1 VIEW COMPARISON:  Single-view of the chest earlier today. FINDINGS: New right IJ central venous catheter tip projects just above the superior cavoatrial junction. Left IJ catheter is unchanged. No pneumothorax. Right worse than left airspace disease effusions are unchanged. Cardiomegaly. Atherosclerosis. IMPRESSION: New right IJ catheter projects just above the superior cavoatrial junction. No pneumothorax. No change in right worse than left airspace disease and effusions. Electronically Signed   By: Robert [name] M.D.   On:  06/14/2020 12:56     Medications:     Scheduled Medications: . (feeding supplement) PROSource Plus  30 mL Oral BID BM  . amiodarone  200 mg Oral BID  . aspirin  81 mg Oral Daily  . atorvastatin  80 mg Oral Daily  . B-complex with vitamin C  1 tablet Oral Daily  . Chlorhexidine Gluconate Cloth  6 each Topical Daily  . Chlorhexidine Gluconate Cloth  6 each Topical Q0600  . Chlorhexidine Gluconate Cloth  6 each Topical Q0600  . dextromethorphan-guaiFENesin  1 tablet Oral BID  . feeding supplement (NEPRO CARB STEADY)  237 mL Oral BID BM  . fluticasone  1 spray Each Nare Daily  . folic acid  1 mg Oral Daily  . gabapentin  300 mg Oral BID  . influenza vaccine adjuvanted  0.5 mL Intramuscular Tomorrow-1000  . insulin aspart  0-20 Units Subcutaneous TID WC  . insulin aspart  12 Units Subcutaneous TID WC  . insulin detemir  30 Units Subcutaneous BID  . mouth rinse  15 mL Mouth Rinse BID  . melatonin  3 mg Oral QHS  . midodrine  10 mg Oral Once  . midodrine  15 mg Oral TID WC  . mometasone-formoterol  2 puff Inhalation BID  . primidone  100 mg Oral QHS  . tamsulosin  0.4 mg Oral Daily    Infusions: . sodium chloride    . sodium chloride    . 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  Assessment/Plan   1. Acute on chronic systolic HF due to iCM - Echo 05/2019 with EF 50-55%, - Echo 9/21 EF 35-40% with moderate to large pericardial effusion - Massively volume overloaded this admit and required CVVHD - Milrinone started on 9/25  for co-ox 53%. NE added to support CVVHD. Milrinone discontinued 10/1. Co-ox stable at 66%.  - Volume status improved with CVVHD (263 -> 213) then trended back up to 240 with iHD - CVVHD restarted on 9/26.  Wt down to 207 lb today (admit weight 239, peak weight 163, previous low this admit 213). CVVH discontinued on Sunday - He has failed iHD today. Dr. Johnney Ou and I discussed next steps with him at bedside.  We do not think he will be able to tolerate iHD and he does not want to continue to pursue as he felt bad today on it. Will stop iHD after today's treatment. Will continue NE and hope for renal recovery. If no renal recovery will switch to comfort care and initiate comfort meds once he becomes uncomfortable with fluid overload or uremia. We discussed home hospice but he is not interested in that.  2. Pericardial effusion. hemorrhagic - s/p tap this admit with 1L bloody fluid - cytology negative - echo 05/31/20: no recurrence - unclear etiology.   3. AKI - likely cardiorenal/ATN - CVVHD 9/17-9/22  Weight 263-> 213 (after CVVHD stopped weight 230 -> 240) - iHD 9/24 with only 1.3L off - Restarted CVVHD 9/26, stopped 10/4 - failed iHD today Plan as above.   4. Acute on chronic hypoxic respiratory failure - predominantly due to volume overload but also has underlying COPD - respiratory status ok today with fluid removal    5. CAD - No s/s ischemia ischemia - continue ASA/statin   6. PAFL/Fib  - now on po amiodarone for rate control.   - Remains in Afib.  - not candidate for DC-CV currently as AC off due to hemorrhagic pericardial effusion (only getting heparin in CVVH, not systemically).    - no change  7. LE wounds  - venous stasis with PAD as well   CRITICAL CARE Performed by: Glori Bickers  Total critical care time: 45 minutes  Critical care time was exclusive of separately billable procedures and treating other patients.  Critical care was necessary to treat or  prevent imminent or life-threatening deterioration.  Critical care was time spent personally by me (independent of midlevel providers or residents) on the following activities: development of treatment plan with patient and/or surrogate as well as nursing, discussions with consultants, evaluation of patient's response to treatment, examination of patient, obtaining history from patient or surrogate, ordering and performing treatments and interventions, ordering and review of laboratory studies, ordering and review of radiographic studies, pulse oximetry and re-evaluation of patient's condition.      Glori Bickers, MD  10:17 AM

## 2020-06-15 NOTE — Progress Notes (Signed)
Occupational Therapy Treatment Patient Details Name: Robert [name] MRN: [id number] DOB: 08/08/1951 Today's Date: 06/15/2020    History of present illness 69 yo admitted with dyspnea, AFib with pericardial effusion with tamponade s/p pericardiocentesis 9/14. Course complicated by AKI on CKD with CRRT (9/17-9/22, restarted 9/26). PMhx: RA, COPD, CHF, CAD, covid, neuropathy, HTN, HLD   OT comments  Pt presents weak, fatigued, and on HD throughout session. HD RN in room throughout. OT session kept to bed level at RN request due. Pt able to participate in grooming task, reports that he had a good meeting with family and Palliative medicine yesterday and that he is "ready to go if it's his time" OT provided comfort. OT will continue to follow acutely.    Follow Up Recommendations  Home health OT    Equipment Recommendations  None recommended by OT    Recommendations for Other Services      Precautions / Restrictions Precautions Precautions: Fall Restrictions Weight Bearing Restrictions: No       Mobility Bed Mobility               General bed mobility comments: declined today  Transfers                 General transfer comment: deferred per RN and active HD session going on    Balance                                           ADL either performed or assessed with clinical judgement   ADL Overall ADL's : Needs assistance/impaired     Grooming: Oral care;Brushing hair;Wash/dry face;Wash/dry hands;Set up;Bed level Grooming Details (indicate cue type and reason): bed level due to HD session going on                                     Vision       Perception     Praxis      Cognition Arousal/Alertness: Awake/alert Behavior During Therapy: [facility] for tasks assessed/performed Overall Cognitive Status: Within Functional Limits for tasks assessed                                          Exercises     Shoulder Instructions       General Comments Pt said that he had a very nice meeting with his family and Palliative care yesterday    Pertinent Vitals/ Pain       Pain Assessment: No/denies pain Pain Intervention(s): Monitored during session  Home Living                                          Prior Functioning/Environment              Frequency  Min 2X/week        Progress Toward Goals  OT Goals(current goals can now be found in the care plan section)  Progress towards OT goals: Not progressing toward goals - comment (fatigue/decreased activity tolerance)  Acute Rehab OT Goals Patient Stated Goal: get to my sister-in-law's house OT Goal Formulation: With  patient Time For Goal Achievement: 06/17/20 Potential to Achieve Goals: [place] Discharge plan remains appropriate    Co-evaluation                 AM-PAC OT "6 Clicks" Daily Activity     Outcome Measure   Help from another person eating meals?: None Help from another person taking care of personal grooming?: A Little Help from another person toileting, which includes using toliet, bedpan, or urinal?: A Lot Help from another person bathing (including washing, rinsing, drying)?: A Lot Help from another person to put on and taking off regular upper body clothing?: A Little Help from another person to put on and taking off regular lower body clothing?: A Lot 6 Click Score: 16    End of Session Equipment Utilized During Treatment: Oxygen (via [place])  OT Visit Diagnosis: Unsteadiness on feet (R26.81);Other abnormalities of gait and mobility (R26.89)   Activity Tolerance Patient limited by fatigue   Patient Left in bed;Other (comment) (HOB elevated)   Nurse Communication Mobility status        Time: 2897-9150 OT Time Calculation (min): 11 min  Charges: OT General Charges $OT Visit: 1 Visit OT Treatments $Self Care/Home Management : 8-22 mins  Jesse Sans OTR/L Acute  Rehabilitation Services Pager: [phone] Office: [place] 06/15/2020, 10:25 AM

## 2020-06-15 NOTE — Progress Notes (Signed)
PT Cancellation Note  Patient Details Name: Robert [name] MRN: [id number] DOB: 06-Jul-1951   Cancelled Treatment:    Reason Eval/Treat Not Completed: (P) Fatigue/lethargy limiting ability to participate;Patient declined, no reason specified (Pt not feeling well and politely declined, will follow up per POC.  RN reports medical decline.  Family at bedside.)   Tracia Lacomb Eli Hose 06/15/2020, 2:44 PM  Erasmo Leventhal , PTA Acute Rehabilitation Services Pager [phone] Office [phone]

## 2020-06-16 DIAGNOSIS — I509 Heart failure, unspecified: Secondary | ICD-10-CM | POA: Diagnosis not present

## 2020-06-16 DIAGNOSIS — J9601 Acute respiratory failure with hypoxia: Secondary | ICD-10-CM | POA: Diagnosis not present

## 2020-06-16 DIAGNOSIS — I5023 Acute on chronic systolic (congestive) heart failure: Secondary | ICD-10-CM | POA: Diagnosis not present

## 2020-06-16 DIAGNOSIS — I313 Pericardial effusion (noninflammatory): Secondary | ICD-10-CM | POA: Diagnosis not present

## 2020-06-16 DIAGNOSIS — N179 Acute kidney failure, unspecified: Secondary | ICD-10-CM | POA: Diagnosis not present

## 2020-06-16 LAB — GLUCOSE, CAPILLARY: Glucose-Capillary: 153 mg/dL — ABNORMAL HIGH (ref 70–99)

## 2020-06-16 MED ORDER — SODIUM ZIRCONIUM CYCLOSILICATE 10 G PO PACK
10.0000 g | PACK | Freq: Once | ORAL | Status: AC
  Administered 2020-06-16: 10 g via ORAL
  Filled 2020-06-16: qty 1

## 2020-06-16 MED ORDER — FUROSEMIDE 10 MG/ML IJ SOLN
160.0000 mg | Freq: Once | INTRAVENOUS | Status: AC
  Administered 2020-06-16: 160 mg via INTRAVENOUS
  Filled 2020-06-16: qty 16

## 2020-06-16 MED ORDER — FUROSEMIDE 10 MG/ML IJ SOLN
160.0000 mg | Freq: Three times a day (TID) | INTRAVENOUS | Status: DC
  Filled 2020-06-16 (×2): qty 16

## 2020-06-16 MED ORDER — HYDROMORPHONE HCL 1 MG/ML IJ SOLN
0.5000 mg | INTRAMUSCULAR | Status: DC | PRN
  Administered 2020-06-18 (×2): 1 mg via INTRAVENOUS
  Filled 2020-06-16 (×2): qty 1

## 2020-06-16 MED ORDER — LORAZEPAM 2 MG/ML IJ SOLN
0.5000 mg | INTRAMUSCULAR | Status: DC | PRN
  Administered 2020-06-17: 0.5 mg via INTRAVENOUS
  Filled 2020-06-16: qty 1

## 2020-06-16 NOTE — Progress Notes (Signed)
PROGRESS NOTE  Robert [name]  DOB: 04-10-1951  PCP: Redmond School, MD [id number]  DOA: 05/27/2020  LOS: 28 days   Chief Complaint  Patient presents with  . Shortness of Breath   Brief narrative: Robert [name] is a 69 y.o. male with PMH of insulin-dependent diabetes mitis, HTN, CAD/MI s/p multiple interventions, chronic combined systolic and diastolic CHF, CKD 3 COPD, peripheral neuropathy, rheumatoid arthritis. Patient presented to the ED on 05/21/2020 with a week history of weight gain, shortness of breath.  He was in respiratory failure.  Chest x-ray showed pulmonary edema. He was admitted for acute exacerbation of CHF.  9/9, echocardiogram showed new systolic diastolic cardiomyopathy with an EF of 35 to 40%.   9/13, repeat echo showed circumferential pericardial effusion and signs of tamponade.  Underwent pericardiocentesis by CT surgery. Patient was continued on maximal dose of IV diuretics with tapering urine output.  His oxygen requirement worsened. Creatinine worsened.  9/17, Chest x-ray showed large right-sided pleural effusion as well as pulmonary vascular congestion.  Critical care and nephrology consultation were obtained.  HD catheter was placed.  Patient was started on CRRT. 10/3, issue with CRRT cartridge leak - likely to resume in the next 24h - discussion with nephro/family on 10/4 about long term planning HD vs palliative if unable to tolerate 10/4 - Overnight issues with central line - likely need to be replaced 10/5 -not tolerating dialysis very well, patient is being set up with palliative care per previous documentation 10/6 -transitioning to hospice/palliative/comfort measures, discontinue all telemetry transition to [address], remove visitation restrictions, expected demise and hospitalized patient does not wish to leave for hospice house or home.  Subjective: No acute issues or events overnight, patient comfortable, denies nausea, vomiting, diarrhea,  constipation, headache, fevers, chills.  Just complains of ongoing fatigue and weakness.  Assessment/Plan: Principal Problem:   Pericardial effusion Active Problems:   Hyperlipidemia   COPD (chronic obstructive pulmonary disease) (HCC)   Controlled type 2 diabetes mellitus with diabetic peripheral angiopathy without gangrene, with long-term current use of insulin (HCC)   Essential hypertension   Goals of care, counseling/discussion   Acute respiratory failure with hypoxemia (HCC)   COPD (chronic obstructive pulmonary disease) with emphysema (HCC)   CKD (chronic kidney disease), stage III   Controlled type 2 diabetes mellitus with hyperglycemia, without long-term current use of insulin (HCC)   Acute on chronic combined systolic and diastolic CHF (congestive heart failure) (HCC)   Acute on chronic congestive heart failure (HCC)   Pericardial tamponade   DNR (do not resuscitate)   Palliative care by specialist   Goals of care -Patient transitioning to full comfort measures at this point, palliative following we appreciate insight recommendations.  Family at bedside, discontinue all labs, telemetry monitoring and vitals.  Focus on comfort measures, expected hospital demise.  Uncontrolled diabetes mellitus 2 with peripheral neuropathy - A1c 7.3 on 9/9 - Well controlled on Lantus 30 units twice daily, scheduled NovoLog Premeal at 12 units 3 times daily and sliding-scale insulin.  Continue Accu-Cheks - Continue Neurontin 300 mg 3 times daily. - Diabetes care coordinator consult appreciated. Recent Labs  Lab 06/14/20 2208 06/15/20 0636 06/15/20 1123 06/15/20 1628 06/16/20 0619  GLUCAP 271* 211* 203* 210* 153*   Acute hypoxemic respiratory failure, improving - Multifactorial: CHF, pleural effusion, pericardial effusion as below SpO2: 100 % O2 Flow Rate (L/min): 4 L/min  Acute exacerbation chronic combined systolic and diastolic CHF Essential hypertension - EF 35 to 40% -  Patient  remains volume overloaded.  CRRT currently on hold due to line issues, attempt to transition to hemodialysis as below.   - Heart failure team following -appreciate insight and recommendations.   AKI on CKD stage IIIb - Likely cardiorenal.  Nephrology following.  Patient is on CRRT.  Creatinine trend as below. Recent Labs    06/10/20 0426 06/10/20 1600 06/11/20 0318 06/11/20 1631 06/12/20 0351 06/12/20 1606 06/13/20 0313 06/13/20 1932 06/14/20 0330 06/15/20 0205  BUN 23 25* 26* 29* 27* 35* 36* 41* 35*  37* 69*  CREATININE 1.59* 1.37* 1.54* 1.58* 1.51* 1.90* 2.20* 2.03* 2.00*  1.95* 2.96*   Bilateral pleural effusions - Improved pleural effusion on chest x-ray repeated on 9/20.  Pericardial effusion w/ tamponade  - Status post pericardiocentesis on 9/13, stable  A. fib, rate controlled - On amiodarone.  Remains in A. fib.   - Currently off anticoagulation because of hemorrhagic pericarditis. - Not a candidate for DCCV per cardiology.  Hyperkalemia/hyperphosphatemia, stable - Related to renal failure.   - Trend as below Recent Labs  Lab 06/10/20 0426 06/10/20 1600 06/11/20 0318 06/11/20 1631 06/12/20 0351 06/12/20 0351 06/12/20 1606 06/13/20 0313 06/13/20 1932 06/14/20 0330 06/15/20 0205  K 4.7   < > 4.9   < > 4.8   < > 4.7 5.2* 5.1 5.5*  5.3* 5.8*  MG 2.4  --  2.4  --  2.6*  --   --  2.7*  --  2.6*  --   PHOS 2.7   < > 2.6   < > 2.9   < > 3.5 3.7 3.6 3.4 5.9*   < > = values in this interval not displayed.   Hyponatremia, improving -Improving with CRRT, defer to nephrology for further management Recent Labs  Lab 06/10/20 0426 06/10/20 1600 06/11/20 0318 06/11/20 1631 06/12/20 0351 06/12/20 1606 06/13/20 0313 06/13/20 1932 06/14/20 0330 06/15/20 0205  NA 132* 133* 130* 132* 131* 131* 129* 130* 131*  130* 126*   Thrombocytopenia -Platelet level running low but fluctuating.  PF4 antibody sent. No immediate indication of argatroban. -Platelet level  is up to 106 today. Recent Labs  Lab 06/10/20 0426 06/11/20 0318 06/12/20 0351 06/13/20 0313 06/14/20 0330 06/15/20 0205  PLT 93* 106* 94* 91* 123* 190   Elevated liver enzymes, minimally improving -Improving.  Negative acute hepatitis panel. -Repeat liver function panel q72h x2 Recent Labs  Lab 06/10/20 1200 06/10/20 1600 06/12/20 1606 06/13/20 0313 06/13/20 1932 06/14/20 0330 06/15/20 0205  AST 101*  --   --   --   --  67*  --   ALT 151*  --   --   --   --  139*  --   ALKPHOS 211*  --   --   --   --  257*  --   BILITOT 0.6  --   --   --   --  0.4  --   PROT 6.3*  --   --   --   --  6.8  --   ALBUMIN 2.6*   < > 2.5* 2.6* 2.6* 2.6*  2.6* 2.4*   < > = values in this interval not displayed.   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  Pressure injury of skin  - Per documentation of wound care/nursing  Muscular deconditioning - PT/OT consults  Mobility: PT OT  eval. encourage ambulation.   Code Status:   Code Status: DNR  Nutritional status: Body mass index is 28.32 kg/m. Nutrition Problem: Increased nutrient needs Etiology: acute illness (AKI on CRRT) Signs/Symptoms: estimated needs Diet Order            Diet regular Room service appropriate? Yes; Fluid consistency: Thin  Diet effective now                 DVT prophylaxis: Place and maintain sequential compression device Start: 06/03/20 0900 Place TED hose Start: 05/31/20 0933 SCD's Start: 05/23/2020 1942   Antimicrobials:  None Fluid: None Consultants: Nephrology, cardiology Family Communication:  None present  Status is: Inpatient Remains inpatient appropriate because: Patient is on CRRT transitioning to HD  Dispo: The patient is from: Home              Anticipated d/c is: Hospital demise              Patient currently is medically stable  for ongoing hospice and palliative care.  Infusions:  . sodium chloride    . sodium chloride    . norepinephrine (LEVOPHED) Adult infusion 2 mcg/min (06/16/20 0600)    Scheduled Meds: . (feeding supplement) PROSource Plus  30 mL Oral BID BM  . amiodarone  200 mg Oral BID  . aspirin  81 mg Oral Daily  . atorvastatin  80 mg Oral Daily  . B-complex with vitamin C  1 tablet Oral Daily  . Chlorhexidine Gluconate Cloth  6 each Topical Daily  . Chlorhexidine Gluconate Cloth  6 each Topical Q0600  . Chlorhexidine Gluconate Cloth  6 each Topical Q0600  . dextromethorphan-guaiFENesin  1 tablet Oral BID  . feeding supplement (NEPRO CARB STEADY)  237 mL Oral BID BM  . fluticasone  1 spray Each Nare Daily  . folic acid  1 mg Oral Daily  . gabapentin  300 mg Oral BID  . influenza vaccine adjuvanted  0.5 mL Intramuscular Tomorrow-1000  . insulin aspart  0-20 Units Subcutaneous TID WC  . insulin aspart  12 Units Subcutaneous TID WC  . insulin detemir  30 Units Subcutaneous BID  . mouth rinse  15 mL Mouth Rinse BID  . melatonin  3 mg Oral QHS  . midodrine  15 mg Oral TID WC  . mometasone-formoterol  2 puff Inhalation BID  . primidone  100 mg Oral QHS  . tamsulosin  0.4 mg Oral Daily    Antimicrobials: Anti-infectives (From admission, onward)   None      PRN meds: sodium chloride, sodium chloride, acetaminophen, albuterol, alteplase, camphor-menthol, glycopyrrolate, heparin, HYDROmorphone (DILAUDID) injection, lidocaine (PF), lidocaine-prilocaine, loperamide, Muscle Rub, ondansetron (ZOFRAN) IV, oxyCODONE, pentafluoroprop-tetrafluoroeth, sodium chloride flush   Objective: Vitals:   06/16/20 0700 06/16/20 0737  BP: (!) 84/61 (!) 78/40  Pulse: 90 85  Resp: (!) 29 (!) 21  Temp:    SpO2: 98% 100%    Intake/Output Summary (Last 24 hours) at 06/16/2020 0750 Last data filed at 06/16/2020 0600 Gross per 24 hour  Intake 922.12 ml  Output 1919 ml  Net -996.88 ml   Filed Weights    06/15/20 0700 06/15/20 1015 06/16/20 0400  Weight: 94.2 kg 93.6 kg 94.7 kg   Weight change: -4.5 kg Body mass index is 28.32 kg/m.   Physical Exam: General exam: Appears fatigued, minimally tachypneic, somewhat lethargic but ANO x4 Skin: No rashes, lesions or ulcers. HEENT: Atraumatic, normocephalic, large venous catheter right IJ bandage clean dry intact Lungs: Diminished  air entry in bases. Remains on supplemental oxygen. CVS: Rate controlled A. fib  GI/Abd soft, nontender, nondistended, bowel sound present CNS: Alert, awake, oriented to place and person Psychiatry: Mood appropriate, somewhat depressed Extremities: 2+ pedal edema bilaterally.  No calf tenderness  Data Review: I have personally reviewed the laboratory data and studies available.  Recent Labs  Lab 06/11/20 0318 06/12/20 0351 06/13/20 0313 06/14/20 0330 06/15/20 0205  WBC 11.8* 11.1* 10.5 11.4* 12.3*  HGB 10.1* 10.6* 10.6* 10.9* 11.4*  HCT 32.1* 33.2* 35.6* 35.1* 35.8*  MCV 95.8 97.4 99.4 98.9 97.5  PLT 106* 94* 91* 123* 190   Recent Labs  Lab 06/10/20 0426 06/10/20 1600 06/11/20 0318 06/11/20 1631 06/12/20 0351 06/12/20 0351 06/12/20 1606 06/13/20 0313 06/13/20 1932 06/14/20 0330 06/15/20 0205  NA 132*   < > 130*   < > 131*   < > 131* 129* 130* 131*  130* 126*  K 4.7   < > 4.9   < > 4.8   < > 4.7 5.2* 5.1 5.5*  5.3* 5.8*  CL 98   < > 98   < > 98   < > 96* 95* 96* 96*  97* 92*  CO2 27   < > 25   < > 24   < > 24 25 24 29  25 22   GLUCOSE 111*   < > 181*   < > 91   < > 243* 207* 276* 168*  171* 256*  BUN 23   < > 26*   < > 27*   < > 35* 36* 41* 35*  37* 69*  CREATININE 1.59*   < > 1.54*   < > 1.51*   < > 1.90* 2.20* 2.03* 2.00*  1.95* 2.96*  CALCIUM 8.6*   < > 8.4*   < > 8.7*   < > 8.6* 8.9 8.5* 8.8*  8.8* 9.6  MG 2.4  --  2.4  --  2.6*  --   --  2.7*  --  2.6*  --   PHOS 2.7   < > 2.6   < > 2.9   < > 3.5 3.7 3.6 3.4 5.9*   < > = values in this interval not displayed.    Signed, Little Ishikawa, DO [facility]  Pager: Epic chat  06/16/2020

## 2020-06-16 NOTE — Progress Notes (Signed)
Pt arrived to dept in bed from [facility] with family, no c/o pain at present. Oriented to the room and dept.

## 2020-06-16 NOTE — Progress Notes (Signed)
Daily Progress Note   Patient Name: Robert [name]       Date: 06/16/2020 DOB: Apr 15, 1951  Age: 69 y.o. MRN#: [id number] Attending Physician: Little Ishikawa, MD Primary Care Physician: Redmond School, MD Admit Date: 05/24/2020  Reason for Consultation/Follow-up: Establishing goals of care  Subjective: Patient wakes to voice. Drowsy but interactive. Denies current pain or dyspnea.   GOC: F/u along with Darrick Grinder NP. Son, [name] at bedside. Multiple family members visited [facility] yesterday. Also appreciative of visit from his pastor. He again tells Amy and I he does not wish for further dialysis. Discussed transition to comfort focused care plan and discontinuation of interventions not aimed at comfort. Discussed emphasis on symptom management to ensure relief from pain and suffering. Discussed comfort feeds. Discussed transfer to 6N for comfort care and unrestricted visitor access. Patient/son understand and agree with plan of care. Again, patient confirms he would prefer to die in the hospital rather than home with hospice. Therapeutic listening and emotional support provided.   Patient would like foley catheter order for EOL if needed. He is not ready for it yet but would like nurses to be able to place if necessary. Per RN, he has required I/O cath 1-2x per shift for retention.   PMT contact information given and encouraged family to call this afternoon with needs.   Discussed with RN, Darrick Grinder NP, and Dr. Avon Gully.    Length of Stay: 28  Current Medications: Scheduled Meds:  . fluticasone  1 spray Each Nare Daily  . gabapentin  300 mg Oral BID  . mouth rinse  15 mL Mouth Rinse BID  . melatonin  3 mg Oral QHS  . midodrine  15 mg Oral TID WC  . mometasone-formoterol  2 puff  Inhalation BID  . sodium zirconium cyclosilicate  10 g Oral Once  . tamsulosin  0.4 mg Oral Daily    Continuous Infusions: . sodium chloride    . sodium chloride    . furosemide      PRN Meds: sodium chloride, sodium chloride, acetaminophen, albuterol, alteplase, camphor-menthol, glycopyrrolate, HYDROmorphone (DILAUDID) injection, lidocaine (PF), lidocaine-prilocaine, loperamide, LORazepam, Muscle Rub, ondansetron (ZOFRAN) IV, oxyCODONE, pentafluoroprop-tetrafluoroeth, sodium chloride flush  Physical Exam Vitals and nursing note reviewed.  Constitutional:      General: He is awake.  Appearance: He is ill-appearing.  HENT:     Head: Normocephalic and atraumatic.  Cardiovascular:     Rate and Rhythm: Rhythm irregularly irregular.  Pulmonary:     Effort: No tachypnea, accessory muscle usage or respiratory distress.  Abdominal:     Tenderness: There is no abdominal tenderness.  Skin:    General: Skin is warm and dry.  Neurological:     Mental Status: He is alert and oriented to person, place, and time.  Psychiatric:        Mood and Affect: Mood normal.        Speech: Speech normal.        Behavior: Behavior normal.        Cognition and Memory: Cognition normal.             Vital Signs: BP (!) 78/40   Pulse 85   Temp 98.5 F (36.9 C) (Oral)   Resp (!) 21   Ht 6' (1.829 m)   Wt 94.7 kg   SpO2 100%   BMI 28.32 kg/m  SpO2: SpO2: 100 % O2 Device: O2 Device: Room Air O2 Flow Rate: O2 Flow Rate (L/min): 4 L/min  Intake/output summary:   Intake/Output Summary (Last 24 hours) at 06/16/2020 1191 Last data filed at 06/16/2020 0600 Gross per 24 hour  Intake 684 ml  Output 1919 ml  Net -1235 ml   LBM: Last BM Date: 06/15/20 Baseline Weight: Weight: 108.4 kg Most recent weight: Weight: 94.7 kg       Palliative Assessment/Data: PPS 30%    Flowsheet Rows     Most Recent Value  Intake Tab  Referral Department Hospitalist  Unit at Time of Referral ICU   Palliative Care Primary Diagnosis Cardiac  Date Notified 06/08/20  Palliative Care Type New Palliative care  Reason for referral Clarify Goals of Care  Date of Admission 06/04/2020  Date first seen by Palliative Care 06/10/20  # of days Palliative referral response time 2 Day(s)  # of days IP prior to Palliative referral 20  Clinical Assessment  Psychosocial & Spiritual Assessment  Palliative Care Outcomes      Patient Active Problem List   Diagnosis Date Noted  . DNR (do not resuscitate)   . Palliative care by specialist   . Pericardial tamponade 05/26/2020  . Pericardial effusion   . Acute on chronic congestive heart failure ([place]) 05/30/2020  . Abnormality of gait 10/02/2019  . Leg length discrepancy 10/02/2019  . Primary osteoarthritis of right knee 08/28/2019  . Chronic pain of both knees 08/04/2019  . Acute on chronic combined systolic and diastolic CHF (congestive heart failure) ([place]) 07/05/2019  . Aspiration pneumonia ([place]) 07/04/2019  . Unspecified atrial fibrillation ([place]) 07/04/2019  . Anemia 07/04/2019  . Aspiration pneumonitis ([place]) 07/04/2019  . Acute respiratory failure with hypoxia ([place])   . CKD (chronic kidney disease), stage III   . Controlled type 2 diabetes mellitus with hyperglycemia, without long-term current use of insulin ([place])   . Urinary retention   . 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 Hypokalemia   . Physical debility 05/22/2019  . Demand ischemia ([place]) 05/20/2019  . Flash pulmonary edema ([place]) 05/20/2019  . Stage II decubitus ulcer ([place]) 05/18/2019  . Aspiration pneumonia of both lower lobes due to gastric secretions ([place]) 05/15/2019  . MRSA pneumonia ([place]) 05/15/2019  . Severe sepsis with septic shock ([place]) 05/15/2019  . NSTEMI (non-ST elevated myocardial infarction) ([place]) 05/15/2019  . Staphylococcal pneumonia ([place]) 04/20/2019  .  HCAP (healthcare-associated pneumonia) 04/20/2019  . Pneumonia due to COVID-19 virus 04/18/2019  . COPD  (chronic obstructive pulmonary disease) with emphysema ([place]) 04/18/2019  . AKI (acute kidney injury) ([place]) 04/18/2019  . Diabetes mellitus type 2, uncontrolled, with complications ([place]) 22/10/5425  . Pressure injury of skin 04/18/2019  . Atrial fibrillation with RVR ([place]) 04/15/2019  . COVID-19 virus detected 04/12/2019  . Acute respiratory distress syndrome (ARDS) due to COVID-19 virus ([place]) 04/12/2019  . Hypoxia   . Sepsis ([place])   . PAD (peripheral artery disease) ([place]) 03/27/2019  . Pulmonary edema 08/27/2018  . Acute respiratory failure with hypoxemia ([place]) 08/27/2018  . Chronic combined systolic and diastolic heart failure ([place]) 08/27/2018  . Dyslipidemia (high LDL; low HDL) 03/29/2018  . Dysphagia 01/02/2018  . Diarrhea 01/02/2018  . Family history of colon cancer 01/02/2018  . Ischemic cardiomyopathy   . Insulin dependent diabetes mellitus   . Hypertension   . History of MI (myocardial infarction)   . GERD (gastroesophageal reflux disease)   . Goals of care, counseling/discussion   . Coronary artery disease   . Anxiety   . Acute ST elevation myocardial infarction (STEMI) of inferior wall ([place]) 06/06/2017  . Varicose veins of left lower extremity with complications 03/04/7627  . Cellulitis of left leg without foot   . Right radial fracture 12/11/2016  . T3 vertebral fracture ([place]) 12/11/2016  . Fall 12/09/2016  . Thrombocytopenia ([place]) 10/09/2016  . Left leg cellulitis 10/08/2016  . Diabetes ([place]) 10/08/2016  . Inguinal hernia 05/12/2016  . Incarcerated umbilical hernia 31/51/7616  . Chronic combined systolic and diastolic CHF (congestive heart failure) ([place]) 04/25/2016  . High cholesterol   . Controlled type 2 diabetes mellitus with diabetic peripheral angiopathy without gangrene, with long-term current use of insulin ([place])   . Essential hypertension   . Non-STEMI (non-ST elevated myocardial infarction) ([place])   . CAD S/P percutaneous coronary angioplasty   . Unstable  angina ([place]) 03/20/2015  . ICM-EF 35% at cath 02/01/15 02/02/2015  . DM type 2 causing vascular disease, not at goal [facility]) 05/29/2014  . Dyspnea 10/20/2013  . COPD (chronic obstructive pulmonary disease) ([place]) 09/03/2013  . Hyperlipidemia   . Old myocardial infarction 09/01/2013  . Abnormal nuclear stress test 05/17/2012  . S/P CFX PTCAI for ISR 02/01/15 05/17/2012  . PVD, chronic LLE 12/22/2011  . Rheumatoid arthritis ([place]) 12/22/2011  . Contrast media allergy 12/22/2011  . Tobacco abuse 12/21/2011    Palliative Care Assessment & Plan   Patient Profile: 69 y.o. male   admitted on 05/26/2020 with a past medical history  of insulin-dependent diabetes mitis, HTN, CAD/MI s/p multiple interventions, chronic combined systolic and diastolic CHF, CKD 3 COPD, peripheral neuropathy, rheumatoid arthritis.  Patient presented to the ED on9/8/2021with a week history of weight gain, shortness of breath. He was in respiratory failure. Chest x-ray showed pulmonary edema. He was admitted for acute exacerbation of CHF.  9/9, echocardiogram showed new systolic diastolic cardiomyopathy with an EF of 35 to 40%.   9/13, repeat echo showed circumferential pericardial effusion and signs of tamponade. Underwent pericardiocentesis by CT surgery.  Patient was continued on maximal dose of IV diuretics with tapering urine output. His oxygen requirement worsened. Creatinine worsened.   9/17, Chest x-ray showed large right-sided pleural effusion as well as pulmonary vascular congestion.   10/4: Off CRRT but remains oliguric. Plan is to try intermittent HD (or await renal recovery). HF team and nephrology concerned about patient's ability to tolerate iHD. Prognosis  remains guarded with tenuous clinical condition.   10/5: Patient did not tolerate iHD. Remains on NE. No further dialysis.   Assessment: Acute on chronic systolic heart failure Ischemic cardiomyopathy with EF 35-40% Pericardial effusion s/p  pericardiocentesis AKI, likely cardiorenal syndrome Afib/flutter LE wounds, venous stasis with PAD Thrombocytopenia Oliguria  Recommendations/Plan: Unfortunately, patient did not tolerate iHD 06/15/20. No plans for further HD.  After further discuss with patient and son, transition to comfort measures only. NE will be discontinued this morning. Discontinue other intervention not aimed at comfort.  Comfort meds added to [facility].  Comfort feeds per patient/family request. Unrestricted visitor access as patient is nearing EOL. Transfer to 6N for comfort care. Patient does not wish to die at home. Would prefer to stay hospitalized.  Foley catheter if patient requests in the next coming days for EOL care/retention.  Keep HD cath in for now in case patient loses IV access.  PMT will continue to follow and support patient/family.   Code Status: DNR   Code Status Orders  (From admission, onward)         Start     Ordered   06/10/20 1633  Do not attempt resuscitation (DNR)  Continuous       Question Answer Comment  In the event of cardiac or respiratory ARREST Do not call a "code blue"   In the event of cardiac or respiratory ARREST Do not perform Intubation, CPR, defibrillation or ACLS   In the event of cardiac or respiratory ARREST Use medication by any route, position, wound care, and other measures to relive pain and suffering. May use oxygen, suction and manual treatment of airway obstruction as needed for comfort.      06/10/20 1633        Code Status History    Date Active Date Inactive Code Status Order ID Comments User Context   05/23/2020 1658 06/10/2020 1633 Full Code [id number]  Bonnielee Haff, MD ED   07/04/2019 0215 07/07/2019 1853 Full Code [id number]  Reubin Milan, MD ED   05/22/2019 1922 06/07/2019 1224 Full Code [id number]  Bary Leriche, PA-C Inpatient   04/12/2019 0646 05/22/2019 1855 Full Code [id number]  [place], McMullen, DO Inpatient   08/27/2018 0243 08/28/2018  1830 Full Code [id number]  Oswald Hillock, MD Inpatient   06/06/2017 2355 06/11/2017 2011 Full Code [id number]  Belva Crome, MD Inpatient   03/03/2017 1947 03/05/2017 1741 Full Code [id number]  Truett Mainland, DO ED   12/09/2016 1624 12/13/2016 1854 Full Code [id number]  Greer Pickerel, MD Inpatient   10/08/2016 1600 10/09/2016 1544 Full Code [id number]  Jule Ser, DO Inpatient   03/22/2015 0844 03/23/2015 1644 Full Code [id number]  Leonie Man, MD Inpatient   03/20/2015 1507 03/22/2015 0844 Full Code [id number]  Jerline Pain, MD Inpatient   02/01/2015 0935 02/02/2015 1420 Full Code [id number]  Belva Crome, MD Inpatient   09/19/2013 0109 09/19/2013 1500 Full Code [id number]  Cletus Gash, MD Inpatient   09/01/2013 2125 09/03/2013 1610 Full Code [id number]  Robet Leu Inpatient   12/21/2011 2337 12/23/2011 1227 Full Code [id number]  Ferne Coe, RN Inpatient   Advance Care Planning Activity      Prognosis: Poor prognosis  Discharge Planning: To Be Determined: anticipate hospital death once life-prolonging interventions are discontinued.  Care plan was discussed with RN, patient, family, Darrick Grinder, Dr. Avon Gully  Thank you for allowing the Palliative Medicine Team to assist in the care of this patient.  Total Time 35 Prolonged Time Billed no    Greater than 50% of this time was spent counseling and coordinating care related to the above assessment and plan.   Ihor Dow, DNP, FNP-C Palliative Medicine Team  Phone: [phone] Fax: [phone]  Please contact Palliative Medicine Team phone at [phone] for questions and concerns.

## 2020-06-16 NOTE — Progress Notes (Addendum)
Patient ID: Robert [name], male   DOB: 1950/12/28, 69 y.o.   MRN: [id number]     Advanced Heart Failure Rounding Note  PCP-Cardiologist: Sanda Klein, MD   Subjective:   On Norepi 2 mcg   Drowsy but wakes up. Asking for a biscuit. .   Objective:   Weight Range: 94.7 kg Body mass index is 28.32 kg/m.   Vital Signs:   Temp:  [97.8 F (36.6 C)-99.3 F (37.4 C)] 98.5 F (36.9 C) (10/06 0400) Pulse Rate:  [54-98] 85 (10/06 0737) Resp:  [18-36] 21 (10/06 0737) BP: (69-111)/(40-72) 78/40 (10/06 0737) SpO2:  [60 %-100 %] 100 % (10/06 0737) Weight:  [93.6 kg-94.7 kg] 94.7 kg (10/06 0400) Last BM Date: 06/15/20  Weight change: Filed Weights   06/15/20 0700 06/15/20 1015 06/16/20 0400  Weight: 94.2 kg 93.6 kg 94.7 kg    Intake/Output:   Intake/Output Summary (Last 24 hours) at 06/16/2020 0805 Last data filed at 06/16/2020 0600 Gross per 24 hour  Intake 684 ml  Output 1919 ml  Net -1235 ml      Physical Exam   General: Appears weak. No resp difficulty HEENT: normal Neck: supple. JVP to jaw. Carotids 2+ bilat; no bruits. No lymphadenopathy or thryomegaly appreciated. Cor: PMI nondisplaced. Irregular rate & rhythm. No rubs, gallops or murmurs. Lungs: clear Abdomen: soft, nontender, nondistended. No hepatosplenomegaly. No bruits or masses. Good bowel sounds. Extremities: no cyanosis, clubbing, rash, edema Neuro: alert & orientedx3, cranial nerves grossly intact. moves all 4 extremities w/o difficulty. Affect flat    Telemetry   A fib 80-90s   Labs    CBC Recent Labs    06/14/20 0330 06/15/20 0205  WBC 11.4* 12.3*  HGB 10.9* 11.4*  HCT 35.1* 35.8*  MCV 98.9 97.5  PLT 123* 630   Basic Metabolic Panel Recent Labs    06/14/20 0330 06/15/20 0205  NA 131*  130* 126*  K 5.5*  5.3* 5.8*  CL 96*  97* 92*  CO2 29  25 22   GLUCOSE 168*  171* 256*  BUN 35*  37* 69*  CREATININE 2.00*  1.95* 2.96*  CALCIUM 8.8*  8.8* 9.6  MG 2.6*  --   PHOS 3.4 5.9*    Liver Function Tests Recent Labs    06/14/20 0330 06/15/20 0205  AST 67*  --   ALT 139*  --   ALKPHOS 257*  --   BILITOT 0.4  --   PROT 6.8  --   ALBUMIN 2.6*  2.6* 2.4*   No results for input(s): LIPASE, AMYLASE in the last 72 hours. Cardiac Enzymes No results for input(s): CKTOTAL, CKMB, CKMBINDEX, TROPONINI in the last 72 hours.  BNP: BNP (last 3 results) Recent Labs    07/03/19 2313 05/16/20 1228 05/25/2020 1310  BNP 346.0* 149.0* 157.0*    ProBNP (last 3 results) No results for input(s): PROBNP in the last 8760 hours.   D-Dimer No results for input(s): DDIMER in the last 72 hours. Hemoglobin A1C No results for input(s): HGBA1C in the last 72 hours. Fasting Lipid Panel No results for input(s): CHOL, HDL, LDLCALC, TRIG, CHOLHDL, LDLDIRECT in the last 72 hours. Thyroid Function Tests No results for input(s): TSH, T4TOTAL, T3FREE, THYROIDAB in the last 72 hours.  Invalid input(s): FREET3  Other results:   Imaging    No results found.   Medications:     Scheduled Medications: . (feeding supplement) PROSource Plus  30 mL Oral BID BM  . amiodarone  200 mg  Oral BID  . aspirin  81 mg Oral Daily  . atorvastatin  80 mg Oral Daily  . B-complex with vitamin C  1 tablet Oral Daily  . Chlorhexidine Gluconate Cloth  6 each Topical Daily  . Chlorhexidine Gluconate Cloth  6 each Topical Q0600  . Chlorhexidine Gluconate Cloth  6 each Topical Q0600  . dextromethorphan-guaiFENesin  1 tablet Oral BID  . feeding supplement (NEPRO CARB STEADY)  237 mL Oral BID BM  . fluticasone  1 spray Each Nare Daily  . folic acid  1 mg Oral Daily  . gabapentin  300 mg Oral BID  . influenza vaccine adjuvanted  0.5 mL Intramuscular Tomorrow-1000  . insulin aspart  0-20 Units Subcutaneous TID WC  . insulin aspart  12 Units Subcutaneous TID WC  . insulin detemir  30 Units Subcutaneous BID  . mouth rinse  15 mL Mouth Rinse BID  . melatonin  3 mg Oral QHS  . midodrine  15 mg Oral  TID WC  . mometasone-formoterol  2 puff Inhalation BID  . primidone  100 mg Oral QHS  . tamsulosin  0.4 mg Oral Daily    Infusions: . sodium chloride    . sodium chloride    . norepinephrine (LEVOPHED) Adult infusion 2 mcg/min (06/16/20 0600)    PRN Medications: sodium chloride, sodium chloride, acetaminophen, albuterol, alteplase, camphor-menthol, glycopyrrolate, heparin, HYDROmorphone (DILAUDID) injection, lidocaine (PF), lidocaine-prilocaine, loperamide, Muscle Rub, ondansetron (ZOFRAN) IV, oxyCODONE, pentafluoroprop-tetrafluoroeth, sodium chloride flush    Patient Profile   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  Assessment/Plan   1. Acute on chronic systolic HF due to iCM - Echo 05/2019 with EF 50-55%, - Echo 9/21 EF 35-40% with moderate to large pericardial effusion - Massively volume overloaded this admit and required CVVHD - Milrinone started on 9/25 for co-ox 53%. NE added to support CVVHD. Milrinone discontinued 10/1. Co-ox stable at 66%.  - Volume status improved with CVVHD (263 -> 213) then trended back up to 240 with iHD - CVVHD restarted on 9/26.  Wt down to 207 lb today (admit weight 239, peak weight 163, previous low this admit 213). CVVH discontinued on Sunday - He has failed iHD.  -Planning to transition to comfort care today.  - Would stop norepi and remove RIJ. He has PIV for comfort meds.    2. Pericardial effusion. hemorrhagic - s/p tap this admit with 1L bloody fluid - cytology negative - echo 05/31/20: no recurrence - unclear etiology.   3. AKI - likely cardiorenal/ATN - CVVHD 9/17-9/22  Weight 263-> 213 (after CVVHD stopped weight 230 -> 240) - iHD 9/24 with only 1.3L off - Restarted CVVHD 9/26, stopped 10/4 - failed iHD 10/6. No plan to restart.   - Worsening renal function today.   4. Acute on chronic hypoxic respiratory failure -  predominantly due to volume overload but also has underlying COPD -Breathing a little more labored today.    5. CAD - No s/s ischemia ischemia - continue ASA/statin   6. PAFL/Fib  - now on po amiodarone for rate control.   - Remains in Afib.  - not candidate for DC-CV currently as AC off due to hemorrhagic pericardial effusion  7. LE wounds  - venous stasis with PAD as well   8. Hyperkalemia K 5.8   9. Robert [name]  Palliative Care following.  Unrestricted visitors.   ? Should be  able to remove RIJ and stop norepi. May be able to move to [place].   Discussed with Palliative Care at bedside.   Darrick Grinder, NP  8:05 AM   Agree with above. See my note as well.   Glori Bickers, MD  8:24 AM

## 2020-06-16 NOTE — Progress Notes (Signed)
Nutrition Brief Note  Chart reviewed. Pt now transitioning to comfort care.  No further nutrition interventions warranted at this time.  Please re-consult as needed.    Gaynell Face, MS, RD, LDN Inpatient Clinical Dietitian Please see AMiON for contact information.

## 2020-06-16 NOTE — Progress Notes (Addendum)
Patient ID: Robert [name], male   DOB: 1950/11/13, 69 y.o.   MRN: [id number]     Advanced Heart Failure Rounding Note  PCP-Cardiologist: Sanda Klein, MD   Subjective:    iHD stopped yesterday. Switched to comfort care. Remains on NE 2. Weight up 2 pounds. Cr 2.00 -> 3.0. K 5.8   400cc out overnight   Objective:   Weight Range: 94.7 kg Body mass index is 28.32 kg/m.   Vital Signs:   Temp:  [97.8 F (36.6 C)-99.3 F (37.4 C)] 98.5 F (36.9 C) (10/06 0400) Pulse Rate:  [54-98] 85 (10/06 0737) Resp:  [18-36] 21 (10/06 0737) BP: (69-111)/(40-72) 78/40 (10/06 0737) SpO2:  [60 %-100 %] 100 % (10/06 0737) Weight:  [93.6 kg-94.7 kg] 94.7 kg (10/06 0400) Last BM Date: 06/15/20  Weight change: Filed Weights   06/15/20 0700 06/15/20 1015 06/16/20 0400  Weight: 94.2 kg 93.6 kg 94.7 kg    Intake/Output:   Intake/Output Summary (Last 24 hours) at 06/16/2020 0811 Last data filed at 06/16/2020 0600 Gross per 24 hour  Intake 684 ml  Output 1919 ml  Net -1235 ml      Physical Exam    General:  In bed weak appearing. No resp difficulty HEENT: normal Neck: supple. JVP to jaw. RIJ Trialysis cath Carotids 2+ bilat; no bruits. No lymphadenopathy or thryomegaly appreciated. Cor: PMI nondisplaced. Irregular rate & rhythm. No rubs, gallops or murmurs. Lungs: clear Abdomen: soft, nontender, + distended. Good bowel sounds. Extremities: no cyanosis, clubbing, rash, 2+ edema Neuro: alert & orientedx3, cranial nerves grossly intact. moves all 4 extremities w/o difficulty. Affect pleasant   Telemetry    A fib 80-90s Personally reviewed   Labs    CBC Recent Labs    06/14/20 0330 06/15/20 0205  WBC 11.4* 12.3*  HGB 10.9* 11.4*  HCT 35.1* 35.8*  MCV 98.9 97.5  PLT 123* 824   Basic Metabolic Panel Recent Labs    06/14/20 0330 06/15/20 0205  NA 131*  130* 126*  K 5.5*  5.3* 5.8*  CL 96*  97* 92*  CO2 29  25 22   GLUCOSE 168*  171* 256*  BUN 35*  37* 69*   CREATININE 2.00*  1.95* 2.96*  CALCIUM 8.8*  8.8* 9.6  MG 2.6*  --   PHOS 3.4 5.9*   Liver Function Tests Recent Labs    06/14/20 0330 06/15/20 0205  AST 67*  --   ALT 139*  --   ALKPHOS 257*  --   BILITOT 0.4  --   PROT 6.8  --   ALBUMIN 2.6*  2.6* 2.4*   No results for input(s): LIPASE, AMYLASE in the last 72 hours. Cardiac Enzymes No results for input(s): CKTOTAL, CKMB, CKMBINDEX, TROPONINI in the last 72 hours.  BNP: BNP (last 3 results) Recent Labs    07/03/19 2313 05/16/20 1228 05/21/2020 1310  BNP 346.0* 149.0* 157.0*    ProBNP (last 3 results) No results for input(s): PROBNP in the last 8760 hours.   D-Dimer No results for input(s): DDIMER in the last 72 hours. Hemoglobin A1C No results for input(s): HGBA1C in the last 72 hours. Fasting Lipid Panel No results for input(s): CHOL, HDL, LDLCALC, TRIG, CHOLHDL, LDLDIRECT in the last 72 hours. Thyroid Function Tests No results for input(s): TSH, T4TOTAL, T3FREE, THYROIDAB in the last 72 hours.  Invalid input(s): FREET3  Other results:   Imaging    No results found.   Medications:     Scheduled Medications: [name] (  feeding supplement) PROSource Plus  30 mL Oral BID BM  . amiodarone  200 mg Oral BID  . aspirin  81 mg Oral Daily  . atorvastatin  80 mg Oral Daily  . B-complex with vitamin C  1 tablet Oral Daily  . Chlorhexidine Gluconate Cloth  6 each Topical Daily  . Chlorhexidine Gluconate Cloth  6 each Topical Q0600  . Chlorhexidine Gluconate Cloth  6 each Topical Q0600  . dextromethorphan-guaiFENesin  1 tablet Oral BID  . feeding supplement (NEPRO CARB STEADY)  237 mL Oral BID BM  . fluticasone  1 spray Each Nare Daily  . folic acid  1 mg Oral Daily  . gabapentin  300 mg Oral BID  . influenza vaccine adjuvanted  0.5 mL Intramuscular Tomorrow-1000  . insulin aspart  0-20 Units Subcutaneous TID WC  . insulin aspart  12 Units Subcutaneous TID WC  . insulin detemir  30 Units Subcutaneous BID  .  mouth rinse  15 mL Mouth Rinse BID  . melatonin  3 mg Oral QHS  . midodrine  15 mg Oral TID WC  . mometasone-formoterol  2 puff Inhalation BID  . primidone  100 mg Oral QHS  . tamsulosin  0.4 mg Oral Daily    Infusions: . sodium chloride    . sodium chloride    . norepinephrine (LEVOPHED) Adult infusion 2 mcg/min (06/16/20 0600)    PRN Medications: sodium chloride, sodium chloride, acetaminophen, albuterol, alteplase, camphor-menthol, glycopyrrolate, heparin, HYDROmorphone (DILAUDID) injection, lidocaine (PF), lidocaine-prilocaine, loperamide, Muscle Rub, ondansetron (ZOFRAN) IV, oxyCODONE, pentafluoroprop-tetrafluoroeth, sodium chloride flush    Patient Profile   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  Assessment/Plan   1. Acute on chronic systolic HF due to iCM - Echo 05/2019 with EF 50-55%, - Echo 9/21 EF 35-40% with moderate to large pericardial effusion - Massively volume overloaded this admit and required CVVHD - Milrinone started on 9/25 for co-ox 53%. NE added to support CVVHD. Milrinone discontinued 10/1. Co-ox stable at 66%.  - Volume status improved with CVVHD (263 -> 213) then trended back up to 240 with iHD - CVVHD restarted on 9/26.  Wt down to 207 lb today (admit weight 239, peak weight 163, previous low this admit 213). CVVH discontinued on Sunday - He has failed iHD again. Dr. Johnney Ou and I discussed next steps with him at bedside yesterday. We do not think he will be able to tolerate iHD and he does not want to continue to pursue as he felt bad today on it. Will continue NE and hope for renal recovery. If no renal recovery will switch to comfort care and initiate comfort meds once he becomes uncomfortable with fluid overload or uremia. Has seen Palliative Care team and is ready to switch to comfort care. We discussed home hospice but he is not interested in that. - Will  give one dose of lasix this am to help keep breathing comfortable  2. Pericardial effusion. hemorrhagic - s/p tap this admit with 1L bloody fluid - cytology negative - echo 05/31/20: no recurrence - unclear etiology.   3. AKI - likely cardiorenal/ATN - CVVHD 9/17-9/22  Weight 263-> 213 (after CVVHD stopped weight 230 -> 240) - iHD 9/24 with only 1.3L off - Restarted CVVHD 9/26, stopped 10/4 - failed iHD for second time  on 10/5  4. Acute on chronic hypoxic respiratory failure - predominantly due to volume  overload but also has underlying COPD - respiratory status ok today. Need to assure comfort  5. CAD - No s/s ischemia - continue ASA/statin   6. PAFL/Fib  - now on po amiodarone for rate control.   - Remains in Afib.  - not candidate for DC-CV currently as AC off due to hemorrhagic pericardial effusion (only getting heparin in CVVH, not systemically).    - no change  7. LE wounds  - venous stasis with PAD as well   CRITICAL CARE Performed by: Glori Bickers  Total critical care time: 35 minutes  Critical care time was exclusive of separately billable procedures and treating other patients.  Critical care was necessary to treat or prevent imminent or life-threatening deterioration.  Critical care was time spent personally by me (independent of midlevel providers or residents) on the following activities: development of treatment plan with patient and/or surrogate as well as nursing, discussions with consultants, evaluation of patient's response to treatment, examination of patient, obtaining history from patient or surrogate, ordering and performing treatments and interventions, ordering and review of laboratory studies, ordering and review of radiographic studies, pulse oximetry and re-evaluation of patient's condition.      Glori Bickers, MD  8:11 AM

## 2020-06-17 DIAGNOSIS — I5023 Acute on chronic systolic (congestive) heart failure: Secondary | ICD-10-CM | POA: Diagnosis not present

## 2020-06-17 DIAGNOSIS — I509 Heart failure, unspecified: Secondary | ICD-10-CM | POA: Diagnosis not present

## 2020-06-17 DIAGNOSIS — N179 Acute kidney failure, unspecified: Secondary | ICD-10-CM | POA: Diagnosis not present

## 2020-06-17 DIAGNOSIS — N1832 Chronic kidney disease, stage 3b: Secondary | ICD-10-CM | POA: Diagnosis not present

## 2020-06-17 DIAGNOSIS — I313 Pericardial effusion (noninflammatory): Secondary | ICD-10-CM | POA: Diagnosis not present

## 2020-06-17 DIAGNOSIS — J9601 Acute respiratory failure with hypoxia: Secondary | ICD-10-CM | POA: Diagnosis not present

## 2020-06-17 MED ORDER — FUROSEMIDE 10 MG/ML IJ SOLN
160.0000 mg | Freq: Once | INTRAVENOUS | Status: AC
  Administered 2020-06-17: 160 mg via INTRAVENOUS
  Filled 2020-06-17: qty 2

## 2020-06-17 MED ORDER — GLYCOPYRROLATE 0.2 MG/ML IJ SOLN
0.2000 mg | Freq: Two times a day (BID) | INTRAMUSCULAR | Status: DC
  Administered 2020-06-17 – 2020-06-18 (×2): 0.2 mg via INTRAVENOUS
  Filled 2020-06-17 (×3): qty 1

## 2020-06-17 NOTE — Progress Notes (Addendum)
Patient ID: Robert [name], male   DOB: May 26, 1951, 69 y.o.   MRN: [id number]     Advanced Heart Failure Rounding Note  PCP-Cardiologist: Robert Klein, MD   Subjective:    iHD stopped 10/5. Switched to comfort care.   Says he feels comfortable currently. Not SOB. No pain.   2 family members are currently present at bedside.     Objective:   Weight Range: 94.7 kg Body mass index is 28.32 kg/m.   Vital Signs:   Temp:  [97.4 F (36.3 C)-98 F (36.7 C)] 98 F (36.7 C) (10/07 0850) Pulse Rate:  [82-85] 85 (10/07 0850) Resp:  [17-18] 18 (10/07 0850) BP: (83-86)/(60-69) 83/60 (10/07 0850) SpO2:  [92 %-96 %] 92 % (10/07 0850) Last BM Date: 06/15/20  Weight change: Filed Weights   06/15/20 0700 06/15/20 1015 06/16/20 0400  Weight: 94.2 kg 93.6 kg 94.7 kg    Intake/Output:   Intake/Output Summary (Last 24 hours) at 06/17/2020 1403 Last data filed at 06/17/2020 0419 Gross per 24 hour  Intake --  Output 300 ml  Net -300 ml      Physical Exam    General: fatigue appearing. Sitting up in bed. No resp difficulty HEENT: normal Neck: supple. JVP elevated to jaw. + RIJ Trialysis cath, Carotids 2+ bilat; no bruits. No lymphadenopathy or thryomegaly appreciated. Cor: PMI nondisplaced. Irregular rate & rhythm. No rubs, gallops or murmurs. Lungs: clear, no wheezing  Abdomen: obese but soft, nontender, mildly distended. Good bowel sounds. Extremities: no cyanosis, clubbing, rash, 2+ edema, bilateral unna boots  Neuro: alert & orientedx3, cranial nerves grossly intact. moves all 4 extremities w/o difficulty. Affect pleasant GU: + foley ~150 cc urine    Telemetry   N/A    Labs    CBC Recent Labs    06/15/20 0205  WBC 12.3*  HGB 11.4*  HCT 35.8*  MCV 97.5  PLT 938   Basic Metabolic Panel Recent Labs    06/15/20 0205  NA 126*  K 5.8*  CL 92*  CO2 22  GLUCOSE 256*  BUN 69*  CREATININE 2.96*  CALCIUM 9.6  PHOS 5.9*   Liver Function Tests Recent Labs     06/15/20 0205  ALBUMIN 2.4*   No results for input(s): LIPASE, AMYLASE in the last 72 hours. Cardiac Enzymes No results for input(s): CKTOTAL, CKMB, CKMBINDEX, TROPONINI in the last 72 hours.  BNP: BNP (last 3 results) Recent Labs    07/03/19 2313 05/16/20 1228 05/18/2020 1310  BNP 346.0* 149.0* 157.0*    ProBNP (last 3 results) No results for input(s): PROBNP in the last 8760 hours.   D-Dimer No results for input(s): DDIMER in the last 72 hours. Hemoglobin A1C No results for input(s): HGBA1C in the last 72 hours. Fasting Lipid Panel No results for input(s): CHOL, HDL, LDLCALC, TRIG, CHOLHDL, LDLDIRECT in the last 72 hours. Thyroid Function Tests No results for input(s): TSH, T4TOTAL, T3FREE, THYROIDAB in the last 72 hours.  Invalid input(s): FREET3  Other results:   Imaging    No results found.   Medications:     Scheduled Medications: . fluticasone  1 spray Each Nare Daily  . mouth rinse  15 mL Mouth Rinse BID  . melatonin  3 mg Oral QHS  . midodrine  15 mg Oral TID WC  . mometasone-formoterol  2 puff Inhalation BID  . tamsulosin  0.4 mg Oral Daily    Infusions: . sodium chloride    . 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  Assessment/Plan   1. Acute on chronic systolic HF due to iCM - Echo 05/2019 with EF 50-55%, - Echo 9/21 EF 35-40% with moderate to large pericardial effusion - Massively volume overloaded this admit and required CVVHD - Milrinone started on 9/25 for co-ox 53%. NE added  to support CVVHD. Milrinone discontinued 10/1. Co-ox stable at 66% on 10/5.  - Volume status improved with CVVHD (263 -> 213) then trended back up to 240 with iHD - CVVHD restarted on 9/26.  Wt down to 208 lb today (admit weight 239, peak weight 163, previous low this admit 213). CVVH discontinued again on 10/5 - He has failed iHD again. Dr. Johnney [name] and I discussed next steps with him at bedside. We do not think he will be able to tolerate iHD and he does not want to continue to pursue as he felt bad on it. He has been switched to comfort care. We discussed home hospice but he is not interested in that. - he reports feeling comfortable currently, can give IV Lasix PRN to help keep breathing comfortable  2. Pericardial effusion. hemorrhagic - s/p tap this admit with 1L bloody fluid - cytology negative - echo 05/31/20: no recurrence - unclear etiology.   3. AKI - likely cardiorenal/ATN - CVVHD 9/17-9/22  Weight 263-> 213 (after CVVHD stopped weight 230 -> 240) - iHD 9/24 with only 1.3L off - Restarted CVVHD 9/26, stopped 10/4 - failed iHD for second time on 10/5 - no further lab draws   4. Acute on chronic hypoxic respiratory failure - predominantly due to volume overload but also has underlying COPD - respiratory status ok today. Need to assure comfort  5. CAD - No s/s ischemia - continue ASA/statin   6. PAFL/Fib  - Remains in Afib.  - off amio  - not candidate for DC-CV currently as AC off due to hemorrhagic pericardial effusion (only getting heparin in CVVH, not systemically).    - no change  7. LE wounds  - venous stasis with PAD as well    Robert Jester, Robert [name]  2:03 PM   Patient seen and examined with the above-signed Advanced Practice Provider and/or Housestaff. I personally reviewed laboratory data, imaging studies and relevant notes. I independently examined the patient and formulated the important aspects of the plan. I have edited the note to reflect any of my  changes or salient points. I have personally discussed the plan with the patient and/or family.  Lying in bed. Son in room with him.  Says he feels ok now but had some nausea/vomiting earlier today. Unable to hold food down. Intermittently short of breath. Not peeing much. "I know the end is near"  General:  Weak appearing. Lethargic but arouses and talks to me.  No resp difficulty HEENT: normal Neck: supple. JVP to ear  Carotids 2+ bilat; no bruits. No lymphadenopathy or thryomegaly appreciated. Cor: PMI nondisplaced. Irregular rate & rhythm. 2/6 TR Lungs: decreased throughout Abdomen: soft, nontender, ++distended. good bowel sounds. Extremities: no cyanosis, clubbing, rash, 2-3+ edema Neuro: lethargic but will awaken and communicate  Had a bit of a rough day but feeling better now. We  discussed starting a morphine gtt but he says he wants to hold off for now because "once I go on that I won;t be getting off it".  Will give one dose IV lasix to see if we can help with his breathing. He knows that comfort meds are available to him at any time.   Robert Bickers, MD  6:29 PM

## 2020-06-17 NOTE — Plan of Care (Signed)
  Problem: Education: Goal: Knowledge of the prescribed therapeutic regimen will improve Outcome: Progressing   Problem: Role Relationship: Goal: Family's ability to cope with current situation will improve Outcome: Progressing   Problem: Pain Management: Goal: Satisfaction with pain management regimen will improve Outcome: Progressing  Emotional support provided to patient and family. Patient stating, "I don't want meds, I don't want tests, but I do want you to come check on me.  I feel like they don't check on me at night."  Reassured patient and family staff would assess comfort frequently. Patient refusing pain meds, denies pain.

## 2020-06-17 NOTE — Progress Notes (Signed)
Daily Progress Note   Patient Name: Robert [name]       Date: 06/17/2020 DOB: 1950/09/24  Age: 69 y.o. MRN#: [id number] Attending Physician: Little Ishikawa, MD Primary Care Physician: Redmond School, MD Admit Date: 06/06/2020  Reason for Consultation/Follow-up: Establishing goals of care  Subjective: Patient wakes to voice. Drowsy and slightly more confused today (usually recognizes this NP when at bedside). Denies pain, dyspnea, or other complaints.   DIL, Kathy at bedside. Family has ensured that someone is with him at all times. Reiterated focus on comfort and symptom management medications. Educated on EOL expectations. Answered questions. Family has PMT contact information.   Length of Stay: 29  Current Medications: Scheduled Meds:  . fluticasone  1 spray Each Nare Daily  . mouth rinse  15 mL Mouth Rinse BID  . melatonin  3 mg Oral QHS  . midodrine  15 mg Oral TID WC  . mometasone-formoterol  2 puff Inhalation BID  . tamsulosin  0.4 mg Oral Daily    Continuous Infusions: . sodium chloride    . sodium chloride      PRN Meds: sodium chloride, sodium chloride, acetaminophen, albuterol, alteplase, camphor-menthol, glycopyrrolate, HYDROmorphone (DILAUDID) injection, lidocaine (PF), lidocaine-prilocaine, loperamide, LORazepam, Muscle Rub, ondansetron (ZOFRAN) IV, oxyCODONE, pentafluoroprop-tetrafluoroeth, sodium chloride flush  Physical Exam Vitals and nursing note reviewed.  Constitutional:      Appearance: He is ill-appearing.  HENT:     Head: Normocephalic and atraumatic.  Cardiovascular:     Rate and Rhythm: Rhythm irregularly irregular.  Pulmonary:     Effort: No tachypnea, accessory muscle usage or respiratory distress.  Abdominal:     Tenderness: There is no  abdominal tenderness.  Skin:    General: Skin is warm and dry.  Neurological:     Mental Status: He is easily aroused.     Comments: Drowsy, periods of confusion  Psychiatric:        Mood and Affect: Mood normal.        Speech: Speech normal.        Behavior: Behavior normal.        Cognition and Memory: Cognition normal.             Vital Signs: BP (!) 83/60 (BP Location: Right Arm)   Pulse 85   Temp 98 F (36.7  C)   Resp 18   Ht 6' (1.829 m)   Wt 94.7 kg   SpO2 92%   BMI 28.32 kg/m  SpO2: SpO2: 92 % O2 Device: O2 Device: Nasal Cannula O2 Flow Rate: O2 Flow Rate (L/min): 4 L/min  Intake/output summary:   Intake/Output Summary (Last 24 hours) at 06/17/2020 1137 Last data filed at 06/17/2020 0419 Gross per 24 hour  Intake 240 ml  Output 300 ml  Net -60 ml   LBM: Last BM Date: 06/15/20 Baseline Weight: Weight: 108.4 kg Most recent weight: Weight: 94.7 kg       Palliative Assessment/Data: PPS 20%    Flowsheet Rows     Most Recent Value  Intake Tab  Referral Department Hospitalist  Unit at Time of Referral ICU  Palliative Care Primary Diagnosis Cardiac  Date Notified 06/08/20  Palliative Care Type New Palliative care  Reason for referral Clarify Goals of Care  Date of Admission 05/17/2020  Date first seen by Palliative Care 06/10/20  # of days Palliative referral response time 2 Day(s)  # of days IP prior to Palliative referral 20  Clinical Assessment  Psychosocial & Spiritual Assessment  Palliative Care Outcomes      Patient Active Problem List   Diagnosis Date Noted  . DNR (do not resuscitate)   . Palliative care by specialist   . Pericardial tamponade 05/26/2020  . Pericardial effusion   . Acute on chronic congestive heart failure ([place]) 05/21/2020  . Abnormality of gait 10/02/2019  . Leg length discrepancy 10/02/2019  . Primary osteoarthritis of right knee 08/28/2019  . Chronic pain of both knees 08/04/2019  . Acute on chronic combined systolic  and diastolic CHF (congestive heart failure) ([place]) 07/05/2019  . Aspiration pneumonia ([place]) 07/04/2019  . Unspecified atrial fibrillation ([place]) 07/04/2019  . Anemia 07/04/2019  . Aspiration pneumonitis ([place]) 07/04/2019  . Acute respiratory failure with hypoxia ([place])   . CKD (chronic kidney disease), stage III   . Controlled type 2 diabetes mellitus with hyperglycemia, without long-term current use of insulin ([place])   . Urinary retention   . 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 Hypokalemia   . Physical debility 05/22/2019  . Demand ischemia ([place]) 05/20/2019  . Flash pulmonary edema ([place]) 05/20/2019  . Stage II decubitus ulcer ([place]) 05/18/2019  . Aspiration pneumonia of both lower lobes due to gastric secretions ([place]) 05/15/2019  . MRSA pneumonia ([place]) 05/15/2019  . Severe sepsis with septic shock ([place]) 05/15/2019  . NSTEMI (non-ST elevated myocardial infarction) ([place]) 05/15/2019  . Staphylococcal pneumonia ([place]) 04/20/2019  . HCAP (healthcare-associated pneumonia) 04/20/2019  . Pneumonia due to COVID-19 virus 04/18/2019  . COPD (chronic obstructive pulmonary disease) with emphysema ([place]) 04/18/2019  . AKI (acute kidney injury) ([place]) 04/18/2019  . Diabetes mellitus type 2, uncontrolled, with complications ([place]) 02/63/7858  . Pressure injury of skin 04/18/2019  . Atrial fibrillation with RVR ([place]) 04/15/2019  . COVID-19 virus detected 04/12/2019  . Acute respiratory distress syndrome (ARDS) due to COVID-19 virus ([place]) 04/12/2019  . Hypoxia   . Sepsis ([place])   . PAD (peripheral artery disease) ([place]) 03/27/2019  . Pulmonary edema 08/27/2018  . Acute respiratory failure with hypoxemia ([place]) 08/27/2018  . Chronic combined systolic and diastolic heart failure ([place]) 08/27/2018  . Dyslipidemia (high LDL; low HDL) 03/29/2018  . Dysphagia 01/02/2018  . Diarrhea 01/02/2018  . Family history of colon cancer 01/02/2018  . Ischemic cardiomyopathy   . Insulin dependent diabetes mellitus   .  Hypertension   . History of MI (myocardial infarction)   . GERD (gastroesophageal reflux disease)   . Goals of care, counseling/discussion   . Coronary artery disease   . Anxiety   . Acute ST elevation myocardial infarction (STEMI) of inferior wall ([place]) 06/06/2017  . Varicose veins of left lower extremity with complications 77/93/9030  . Cellulitis of left leg without foot   . Right radial fracture 12/11/2016  . T3 vertebral fracture ([place]) 12/11/2016  . Fall 12/09/2016  . Thrombocytopenia ([place]) 10/09/2016  . Left leg cellulitis 10/08/2016  . Diabetes ([place]) 10/08/2016  . Inguinal hernia 05/12/2016  . Incarcerated umbilical hernia 06/03/3006  . Chronic combined systolic and diastolic CHF (congestive heart failure) ([place]) 04/25/2016  . High cholesterol   . Controlled type 2 diabetes mellitus with diabetic peripheral angiopathy without gangrene, with long-term current use of insulin ([place])   . Essential hypertension   . Non-STEMI (non-ST elevated myocardial infarction) ([place])   . CAD S/P percutaneous coronary angioplasty   . Unstable angina ([place]) 03/20/2015  . ICM-EF 35% at cath 02/01/15 02/02/2015  . DM type 2 causing vascular disease, not at goal [facility]) 05/29/2014  . Dyspnea 10/20/2013  . COPD (chronic obstructive pulmonary disease) ([place]) 09/03/2013  . Hyperlipidemia   . Old myocardial infarction 09/01/2013  . Abnormal nuclear stress test 05/17/2012  . S/P CFX PTCAI for ISR 02/01/15 05/17/2012  . PVD, chronic LLE 12/22/2011  . Rheumatoid arthritis ([place]) 12/22/2011  . Contrast media allergy 12/22/2011  . Tobacco abuse 12/21/2011    Palliative Care Assessment & Plan   Patient Profile: 69 y.o. male   admitted on 05/18/2020 with a past medical history  of insulin-dependent diabetes mitis, HTN, CAD/MI s/p multiple interventions, chronic combined systolic and diastolic CHF, CKD 3 COPD, peripheral neuropathy, rheumatoid arthritis.  Patient presented to the ED on9/8/2021with a week  history of weight gain, shortness of breath. He was in respiratory failure. Chest x-ray showed pulmonary edema. He was admitted for acute exacerbation of CHF.  9/9, echocardiogram showed new systolic diastolic cardiomyopathy with an EF of 35 to 40%.   9/13, repeat echo showed circumferential pericardial effusion and signs of tamponade. Underwent pericardiocentesis by CT surgery.  Patient was continued on maximal dose of IV diuretics with tapering urine output. His oxygen requirement worsened. Creatinine worsened.   9/17, Chest x-ray showed large right-sided pleural effusion as well as pulmonary vascular congestion.   10/4: Off CRRT but remains oliguric. Plan is to try intermittent HD (or await renal recovery). HF team and nephrology concerned about patient's ability to tolerate iHD. Prognosis remains guarded with tenuous clinical condition.   10/5: Patient did not tolerate iHD. Remains on NE. No further dialysis.   Assessment: Acute on chronic systolic heart failure Ischemic cardiomyopathy with EF 35-40% Pericardial effusion s/p pericardiocentesis AKI, likely cardiorenal syndrome Afib/flutter LE wounds, venous stasis with PAD Thrombocytopenia Oliguria  Recommendations/Plan: Unfortunately, patient did not tolerate iHD 06/15/20. No plans for further HD.  Transitioned to [place] on 06/16/20. Comfort meds added to [facility].  Comfort feeds per patient/family request. Unrestricted visitor access as patient is nearing EOL. Transfer to 6N for comfort care. Patient does not wish to die at home. Would prefer to stay hospitalized.  Foley catheter if patient requests in the next coming days for EOL care/retention.  Keep HD cath in for now in case patient loses IV access.  PMT will continue to follow and support patient/family.   Code Status: DNR   Code Status Orders  (From  admission, onward)         Start     Ordered   06/10/20 1633  Do not attempt resuscitation (DNR)  Continuous        Question Answer Comment  In the event of cardiac or respiratory ARREST Do not call a "code blue"   In the event of cardiac or respiratory ARREST Do not perform Intubation, CPR, defibrillation or ACLS   In the event of cardiac or respiratory ARREST Use medication by any route, position, wound care, and other measures to relive pain and suffering. May use oxygen, suction and manual treatment of airway obstruction as needed for comfort.      06/10/20 1633        Code Status History    Date Active Date Inactive Code Status Order ID Comments User Context   06/03/2020 1658 06/10/2020 1633 Full Code [id number]  Bonnielee Haff, MD ED   07/04/2019 0215 07/07/2019 1853 Full Code [id number]  Reubin Milan, MD ED   05/22/2019 1922 06/07/2019 1224 Full Code [id number]  Bary Leriche, PA-C Inpatient   04/12/2019 0646 05/22/2019 1855 Full Code [id number]  Lamar, [place], DO Inpatient   08/27/2018 0243 08/28/2018 1830 Full Code [id number]  Oswald Hillock, MD Inpatient   06/06/2017 2355 06/11/2017 2011 Full Code [id number]  Belva Crome, MD Inpatient   03/03/2017 1947 03/05/2017 1741 Full Code [id number]  Truett Mainland, DO ED   12/09/2016 1624 12/13/2016 1854 Full Code [id number]  Greer Pickerel, MD Inpatient   10/08/2016 1600 10/09/2016 1544 Full Code [id number]  Jule Ser, DO Inpatient   03/22/2015 0844 03/23/2015 1644 Full Code [id number]  Leonie Man, MD Inpatient   03/20/2015 1507 03/22/2015 0844 Full Code [id number]  Jerline Pain, MD Inpatient   02/01/2015 0935 02/02/2015 1420 Full Code [id number]  Belva Crome, MD Inpatient   09/19/2013 0109 09/19/2013 1500 Full Code [id number]  Cletus Gash, MD Inpatient   09/01/2013 2125 09/03/2013 1610 Full Code [id number]  Robet Leu Inpatient   12/21/2011 2337 12/23/2011 1227 Full Code [id number]  Ferne Coe, RN Inpatient   Advance Care Planning Activity      Prognosis: Poor prognosis  Discharge Planning: Anticipate hospital death once  life-prolonging interventions are discontinued.  Care plan was discussed with RN, patient, DIL  Thank you for allowing the Palliative Medicine Team to assist in the care of this patient.   Total Time 15 Prolonged Time Billed no  .  Greater than 50% of this time was spent counseling and coordinating care related to the above assessment and plan.   Ihor Dow, DNP, FNP-C Palliative Medicine Team  Phone: [phone] Fax: [phone]  Please contact Palliative Medicine Team phone at [phone] for questions and concerns.

## 2020-06-17 NOTE — Progress Notes (Signed)
Orthopedic Tech Progress Note Patient Details:  Robert [name] 11-Jul-1951 [id number]  Ortho Devices Type of Ortho Device: Louretta Parma boot Ortho Device/Splint Location: BLE Ortho Device/Splint Interventions: Application   Post Interventions Patient Tolerated: Well Instructions Provided: Care of device, Poper ambulation with device   [name] 06/17/2020, 2:19 PM

## 2020-06-17 NOTE — Progress Notes (Signed)
PROGRESS NOTE  Robert [name]  DOB: Mar 28, 1951  PCP: Redmond School, MD TKP:[id number]  DOA: 05/13/2020  LOS: 29 days   Chief Complaint  Patient presents with  . Shortness of Breath   Brief narrative: Robert [name] is a 69 y.o. male with PMH of insulin-dependent diabetes mitis, HTN, CAD/MI s/p multiple interventions, chronic combined systolic and diastolic CHF, CKD 3 COPD, peripheral neuropathy, rheumatoid arthritis. Patient presented to the ED on 05/27/2020 with a week history of weight gain, shortness of breath.  He was in respiratory failure.  Chest x-ray showed pulmonary edema. He was admitted for acute exacerbation of CHF.  9/9, echocardiogram showed new systolic diastolic cardiomyopathy with an EF of 35 to 40%.   9/13, repeat echo showed circumferential pericardial effusion and signs of tamponade.  Underwent pericardiocentesis by CT surgery. Patient was continued on maximal dose of IV diuretics with tapering urine output.  His oxygen requirement worsened. Creatinine worsened.  9/17, Chest x-ray showed large right-sided pleural effusion as well as pulmonary vascular congestion.  Critical care and nephrology consultation were obtained.  HD catheter was placed.  Patient was started on CRRT. 10/3, issue with CRRT cartridge leak - likely to resume in the next 24h - discussion with nephro/family on 10/4 about long term planning HD vs palliative if unable to tolerate 10/4 - Overnight issues with central line - likely need to be replaced 10/5 -not tolerating dialysis very well, patient is being set up with palliative care per previous documentation 10/6 -transitioning to hospice/palliative/comfort measures, discontinue all telemetry transition to [address], remove visitation restrictions, expected demise and hospitalized patient does not wish to leave for hospice house or home.  Subjective: No acute issues or events overnight, patient comfortable, denies nausea, vomiting, diarrhea,  constipation, headache, fevers, chills.  Just complains of ongoing fatigue and weakness.  Assessment/Plan: Principal Problem:   Pericardial effusion Active Problems:   Hyperlipidemia   COPD (chronic obstructive pulmonary disease) (HCC)   Controlled type 2 diabetes mellitus with diabetic peripheral angiopathy without gangrene, with long-term current use of insulin (HCC)   Essential hypertension   Goals of care, counseling/discussion   Acute respiratory failure with hypoxemia (HCC)   COPD (chronic obstructive pulmonary disease) with emphysema (HCC)   CKD (chronic kidney disease), stage III   Controlled type 2 diabetes mellitus with hyperglycemia, without long-term current use of insulin (HCC)   Acute on chronic combined systolic and diastolic CHF (congestive heart failure) (HCC)   Acute on chronic congestive heart failure (HCC)   Pericardial tamponade   DNR (do not resuscitate)   Palliative care by specialist   Goals of care -Patient transitioning to full comfort measures at this point, palliative following we appreciate insight recommendations.  Family at bedside, discontinue all labs, telemetry monitoring and vitals.  Focus on comfort measures, expected hospital demise.  Uncontrolled diabetes mellitus 2 with peripheral neuropathy - A1c 7.3 on 9/9 - Well controlled on Lantus 30 units twice daily, scheduled NovoLog Premeal at 12 units 3 times daily and sliding-scale insulin.  Continue Accu-Cheks - Continue Neurontin 300 mg 3 times daily. - Diabetes care coordinator consult appreciated. Recent Labs  Lab 06/14/20 2208 06/15/20 0636 06/15/20 1123 06/15/20 1628 06/16/20 0619  GLUCAP 271* 211* 203* 210* 153*   Acute hypoxemic respiratory failure, improving - Multifactorial: CHF, pleural effusion, pericardial effusion as below SpO2: 96 % O2 Flow Rate (L/min): 4 L/min FiO2 (%): 21 %  Acute exacerbation chronic combined systolic and diastolic CHF Essential hypertension - EF  35 to  40% - Patient remains volume overloaded.  CRRT currently on hold due to line issues, attempt to transition to hemodialysis as below.   - Heart failure team following -appreciate insight and recommendations.   AKI on CKD stage IIIb - Likely cardiorenal.  Nephrology following.  Patient is on CRRT.  Creatinine trend as below. Recent Labs    06/10/20 0426 06/10/20 1600 06/11/20 0318 06/11/20 1631 06/12/20 0351 06/12/20 1606 06/13/20 0313 06/13/20 1932 06/14/20 0330 06/15/20 0205  BUN 23 25* 26* 29* 27* 35* 36* 41* 35*  37* 69*  CREATININE 1.59* 1.37* 1.54* 1.58* 1.51* 1.90* 2.20* 2.03* 2.00*  1.95* 2.96*   Bilateral pleural effusions - Improved pleural effusion on chest x-ray repeated on 9/20.  Pericardial effusion w/ tamponade  - Status post pericardiocentesis on 9/13, stable  A. fib, rate controlled - On amiodarone.  Remains in A. fib.   - Currently off anticoagulation because of hemorrhagic pericarditis. - Not a candidate for DCCV per cardiology.  Hyperkalemia/hyperphosphatemia, stable - Related to renal failure.   - Trend as below Recent Labs  Lab 06/11/20 0318 06/11/20 1631 06/12/20 0351 06/12/20 0351 06/12/20 1606 06/13/20 0313 06/13/20 1932 06/14/20 0330 06/15/20 0205  K 4.9   < > 4.8   < > 4.7 5.2* 5.1 5.5*  5.3* 5.8*  MG 2.4  --  2.6*  --   --  2.7*  --  2.6*  --   PHOS 2.6   < > 2.9   < > 3.5 3.7 3.6 3.4 5.9*   < > = values in this interval not displayed.   Hyponatremia, improving -Improving with CRRT, defer to nephrology for further management Recent Labs  Lab 06/10/20 1600 06/11/20 0318 06/11/20 1631 06/12/20 0351 06/12/20 1606 06/13/20 0313 06/13/20 1932 06/14/20 0330 06/15/20 0205  NA 133* 130* 132* 131* 131* 129* 130* 131*  130* 126*   Thrombocytopenia -Platelet level running low but fluctuating.  PF4 antibody sent. No immediate indication of argatroban. -Platelet level is up to 106 today. Recent Labs  Lab 06/11/20 0318  06/12/20 0351 06/13/20 0313 06/14/20 0330 06/15/20 0205  PLT 106* 94* 91* 123* 190   Elevated liver enzymes, minimally improving -Improving.  Negative acute hepatitis panel. -Repeat liver function panel q72h x2 Recent Labs  Lab 06/10/20 1200 06/10/20 1600 06/12/20 1606 06/13/20 0313 06/13/20 1932 06/14/20 0330 06/15/20 0205  AST 101*  --   --   --   --  67*  --   ALT 151*  --   --   --   --  139*  --   ALKPHOS 211*  --   --   --   --  257*  --   BILITOT 0.6  --   --   --   --  0.4  --   PROT 6.3*  --   --   --   --  6.8  --   ALBUMIN 2.6*   < > 2.5* 2.6* 2.6* 2.6*  2.6* 2.4*   < > = values in this interval not displayed.   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  Pressure injury of skin  - Per documentation of wound care/nursing  Muscular deconditioning - PT/OT consults  Mobility: PT OT eval. encourage ambulation.   Code Status:   Code Status: DNR  Nutritional status: Body mass index is 28.[place]  kg/m. Nutrition Problem: Increased nutrient needs Etiology: acute illness (AKI on CRRT) Signs/Symptoms: estimated needs Diet Order            Diet regular Room service appropriate? Yes; Fluid consistency: Thin  Diet effective now                 DVT prophylaxis: Place and maintain sequential compression device Start: 06/03/20 0900 Place TED hose Start: 05/31/20 0933 SCD's Start: 06/06/2020 1942   Antimicrobials:  None Fluid: None Consultants: Nephrology, cardiology Family Communication:  None present  Status is: Inpatient Remains inpatient appropriate because: Patient is on CRRT transitioning to HD  Dispo: The patient is from: Home              Anticipated d/c is: Hospital demise              Patient currently is medically stable for ongoing hospice and palliative care.  Infusions:  . sodium chloride     . sodium chloride      Scheduled Meds: . fluticasone  1 spray Each Nare Daily  . mouth rinse  15 mL Mouth Rinse BID  . melatonin  3 mg Oral QHS  . midodrine  15 mg Oral TID WC  . mometasone-formoterol  2 puff Inhalation BID  . tamsulosin  0.4 mg Oral Daily    Antimicrobials: Anti-infectives (From admission, onward)   None      PRN meds: sodium chloride, sodium chloride, acetaminophen, albuterol, alteplase, camphor-menthol, glycopyrrolate, HYDROmorphone (DILAUDID) injection, lidocaine (PF), lidocaine-prilocaine, loperamide, LORazepam, Muscle Rub, ondansetron (ZOFRAN) IV, oxyCODONE, pentafluoroprop-tetrafluoroeth, sodium chloride flush   Objective: Vitals:   06/16/20 1006 06/16/20 2020  BP:  (!) 86/69  Pulse:  82  Resp:  17  Temp:  (!) 97.4 F (36.3 C)  SpO2: 94% 96%    Intake/Output Summary (Last 24 hours) at 06/17/2020 0839 Last data filed at 06/17/2020 0419 Gross per 24 hour  Intake 244.93 ml  Output 300 ml  Net -55.07 ml   Filed Weights   06/15/20 0700 06/15/20 1015 06/16/20 0400  Weight: 94.2 kg 93.6 kg 94.7 kg   Weight change:  Body mass index is 28.32 kg/m.   Physical Exam: General exam: Appears fatigued, minimally tachypneic, somewhat lethargic but ANO x4 Skin: No rashes, lesions or ulcers. HEENT: Atraumatic, normocephalic, large venous catheter right IJ bandage clean dry intact Lungs: Diminished air entry in bases. Remains on supplemental oxygen. CVS: Rate controlled A. fib  GI/Abd soft, nontender, nondistended, bowel sound present CNS: Alert, awake, oriented to place and person Psychiatry: Mood appropriate, somewhat depressed Extremities: 2+ pedal edema bilaterally.  No calf tenderness  Data Review: I have personally reviewed the laboratory data and studies available.  Recent Labs  Lab 06/11/20 0318 06/12/20 0351 06/13/20 0313 06/14/20 0330 06/15/20 0205  WBC 11.8* 11.1* 10.5 11.4* 12.3*  HGB 10.1* 10.6* 10.6* 10.9* 11.4*  HCT 32.1* 33.2*  35.6* 35.1* 35.8*  MCV 95.8 97.4 99.4 98.9 97.5  PLT 106* 94* 91* 123* 190   Recent Labs  Lab 06/11/20 0318 06/11/20 1631 06/12/20 0351 06/12/20 0351 06/12/20 1606 06/13/20 0313 06/13/20 1932 06/14/20 0330 06/15/20 0205  NA 130*   < > 131*   < > 131* 129* 130* 131*  130* 126*  K 4.9   < > 4.8   < > 4.7 5.2* 5.1 5.5*  5.3* 5.8*  CL 98   < > 98   < > 96* 95* 96* 96*  97* 92*  CO2 25   < >  24   < > 24 25 24 29  25 22   GLUCOSE 181*   < > 91   < > 243* 207* 276* 168*  171* 256*  BUN 26*   < > 27*   < > 35* 36* 41* 35*  37* 69*  CREATININE 1.54*   < > 1.51*   < > 1.90* 2.20* 2.03* 2.00*  1.95* 2.96*  CALCIUM 8.4*   < > 8.7*   < > 8.6* 8.9 8.5* 8.8*  8.8* 9.6  MG 2.4  --  2.6*  --   --  2.7*  --  2.6*  --   PHOS 2.6   < > 2.9   < > 3.5 3.7 3.6 3.4 5.9*   < > = values in this interval not displayed.    Signed, Little Ishikawa, DO [facility]  Pager: Epic chat  06/17/2020

## 2020-06-18 DIAGNOSIS — I313 Pericardial effusion (noninflammatory): Secondary | ICD-10-CM | POA: Diagnosis not present

## 2020-06-18 DIAGNOSIS — I5023 Acute on chronic systolic (congestive) heart failure: Secondary | ICD-10-CM | POA: Diagnosis not present

## 2020-06-18 DIAGNOSIS — N179 Acute kidney failure, unspecified: Secondary | ICD-10-CM | POA: Diagnosis not present

## 2020-06-18 DIAGNOSIS — J9601 Acute respiratory failure with hypoxia: Secondary | ICD-10-CM | POA: Diagnosis not present

## 2020-06-18 DIAGNOSIS — I509 Heart failure, unspecified: Secondary | ICD-10-CM | POA: Diagnosis not present

## 2020-06-18 DIAGNOSIS — N1832 Chronic kidney disease, stage 3b: Secondary | ICD-10-CM | POA: Diagnosis not present

## 2020-06-28 ENCOUNTER — Ambulatory Visit: Payer: Medicare HMO | Admitting: Physical Medicine & Rehabilitation

## 2020-07-12 NOTE — Progress Notes (Addendum)
Patient ID: Robert [name], male   DOB: [birth year], 69 y.o.   MRN: [id number]     Advanced Heart Failure Rounding Note  PCP-Cardiologist: Sanda Klein, MD   Subjective:    iHD stopped 10/5. Switched to comfort care.   Family at bedside.   He has been sleeping more today, per family,  but still responsive. Awakes on command and squeezes hand.   Denies any pain. He has not requested morphine yet.   Objective:   Weight Range: 94.7 kg Body mass index is 28.32 kg/m.   Vital Signs:   Temp:  [97.6 F (36.4 C)] 97.6 F (36.4 C) (10/08 0457) Pulse Rate:  [81] 81 (10/08 0457) Resp:  [15] 15 (10/08 0457) BP: (84)/(53) 84/53 (10/08 0457) SpO2:  [97 %] 97 % (10/08 0457) Last BM Date: 06/15/20  Weight change: Filed Weights   06/15/20 0700 06/15/20 1015 06/16/20 0400  Weight: 94.2 kg 93.6 kg 94.7 kg    Intake/Output:   Intake/Output Summary (Last 24 hours) at 2020-06-23 1243 Last data filed at June 23, 2020 0503 Gross per 24 hour  Intake 220 ml  Output --  Net 220 ml      Physical Exam    General: lethargic but arousable. No resp difficulty HEENT: normal Neck: supple. JVP elevated to jaw. + RIJ Trialysis cath, Carotids 2+ bilat; no bruits. No lymphadenopathy or thryomegaly appreciated. Cor: PMI nondisplaced. Irregular rate & rhythm. No rubs, gallops or murmurs. Lungs: rhonchi on the left anteriorly. No wheezing  Abdomen: obese but soft, nontender, mildly distended. Good bowel sounds. Extremities: no cyanosis, clubbing, rash, 2+ edema, bilateral unna boots  Neuro: lethargic but arousable GU: + foley < 100 cc urine    Telemetry   N/A    Labs    CBC No results for input(s): WBC, NEUTROABS, HGB, HCT, MCV, PLT in the last 72 hours. Basic Metabolic Panel No results for input(s): NA, K, CL, CO2, GLUCOSE, BUN, CREATININE, CALCIUM, MG, PHOS in the last 72 hours. Liver Function Tests No results for input(s): AST, ALT, ALKPHOS, BILITOT, PROT, ALBUMIN in the last 72  hours. No results for input(s): LIPASE, AMYLASE in the last 72 hours. Cardiac Enzymes No results for input(s): CKTOTAL, CKMB, CKMBINDEX, TROPONINI in the last 72 hours.  BNP: BNP (last 3 results) Recent Labs    07/03/19 2313 05/16/20 1228 05/14/2020 1310  BNP 346.0* 149.0* 157.0*    ProBNP (last 3 results) No results for input(s): PROBNP in the last 8760 hours.   D-Dimer No results for input(s): DDIMER in the last 72 hours. Hemoglobin A1C No results for input(s): HGBA1C in the last 72 hours. Fasting Lipid Panel No results for input(s): CHOL, HDL, LDLCALC, TRIG, CHOLHDL, LDLDIRECT in the last 72 hours. Thyroid Function Tests No results for input(s): TSH, T4TOTAL, T3FREE, THYROIDAB in the last 72 hours.  Invalid input(s): FREET3  Other results:   Imaging    No results found.   Medications:     Scheduled Medications: . fluticasone  1 spray Each Nare Daily  . glycopyrrolate  0.2 mg Intravenous BID  . mouth rinse  15 mL Mouth Rinse BID  . melatonin  3 mg Oral QHS  . midodrine  15 mg Oral TID WC  . mometasone-formoterol  2 puff Inhalation BID  . tamsulosin  0.4 mg Oral Daily    Infusions: . sodium chloride    . sodium chloride      PRN Medications: sodium chloride, sodium chloride, acetaminophen, albuterol, alteplase, camphor-menthol, HYDROmorphone (DILAUDID) injection,  lidocaine (PF), lidocaine-prilocaine, loperamide, LORazepam, Muscle Rub, ondansetron (ZOFRAN) IV, oxyCODONE, pentafluoroprop-tetrafluoroeth, sodium chloride flush    Patient Profile   Mr. 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  Assessment/Plan   1. Acute on chronic systolic HF due to iCM - Echo 05/2019 with EF 50-55%, - Echo 9/21 EF 35-40% with moderate to large pericardial effusion - Massively volume overloaded this admit and required CVVHD - Milrinone started on 9/25 for co-ox 53%. NE added  to support CVVHD. Milrinone discontinued 10/1. Co-ox stable at 66% on 10/5.  - Volume status improved with CVVHD (263 -> 213) then trended back up to 240 with iHD - CVVHD restarted on 9/26.  Wt down to 208 lb today (admit weight 239, peak weight 163, previous low this admit 213). CVVH discontinued again on 10/5 - He has failed iHD again. Dr. Johnney Ou and I discussed next steps with him at bedside. We do not think he will be able to tolerate iHD and he does not want to continue to pursue as he felt bad on it. He has been switched to comfort care. We discussed home hospice but he is not interested in that. - more lethargic today but arousable. Denies any discomfort. Discussed w/ family that morphine is available for comfort use    2. Pericardial effusion. hemorrhagic - s/p tap this admit with 1L bloody fluid - cytology negative - echo 05/31/20: no recurrence - unclear etiology.   3. AKI - likely cardiorenal/ATN - CVVHD 9/17-9/22  Weight 263-> 213 (after CVVHD stopped weight 230 -> 240) - iHD 9/24 with only 1.3L off - Restarted CVVHD 9/26, stopped 10/4 - failed iHD for second time on 10/5 - no further lab draws   4. Acute on chronic hypoxic respiratory failure - predominantly due to volume overload but also has underlying COPD - respiratory status ok today. Need to assure comfort  5. CAD - No s/s ischemia - continue ASA/statin   6. PAFL/Fib  - Remains in Afib.  - off amio  - not candidate for DC-CV currently as AC off due to hemorrhagic pericardial effusion (only getting heparin in CVVH, not systemically).    - no change  7. LE wounds  - venous stasis with PAD as well    Lyda Jester, PA-C  12:43 PM   Patient seen and examined with the above-signed Advanced Practice Provider and/or Housestaff. I personally reviewed laboratory data, imaging studies and relevant notes. I independently examined the patient and formulated the important aspects of the plan. I have edited the  note to reflect any of my changes or salient points. I have personally discussed the plan with the patient and/or family.  He is lethargic and actively dying. Non-communicative. Agonal breathing at times.   Suspect he will pass in next 24 hours.  Discussed with family at bedside about how much we all admired him and his spirit.   Glori Bickers, MD  6:27 PM

## 2020-07-12 NOTE — Progress Notes (Signed)
Palliative Medicine RN Note: Symptom check earlier today. Son at bedside providing sips of [facility] via sponge. O2 [place] in mouth. Pt able to wake and deny pain but reports trouble breathing. RN will bring hydromorphone. I will have PMT weekend coverage follow tomorrow.  Marjie Skiff Wade Asebedo, RN, BSN, [facility] Palliative Medicine Team 07/10/20 4:06 PM Office [phone]

## 2020-07-12 NOTE — Progress Notes (Signed)
PROGRESS NOTE  Robert [name]  DOB: June 30, 1951  PCP: Redmond School, MD PIR:[id number]  DOA: 05/31/2020  LOS: 30 days   Chief Complaint  Patient presents with   Shortness of Breath   Brief narrative: Robert [name] is a 69 y.o. male with PMH of insulin-dependent diabetes mitis, HTN, CAD/MI s/p multiple interventions, chronic combined systolic and diastolic CHF, CKD 3 COPD, peripheral neuropathy, rheumatoid arthritis. Patient presented to the ED on 05/30/2020 with a week history of weight gain, shortness of breath.  He was in respiratory failure.  Chest x-ray showed pulmonary edema. He was admitted for acute exacerbation of CHF.  9/9, echocardiogram showed new systolic diastolic cardiomyopathy with an EF of 35 to 40%.   9/13, repeat echo showed circumferential pericardial effusion and signs of tamponade.  Underwent pericardiocentesis by CT surgery. Patient was continued on maximal dose of IV diuretics with tapering urine output.  His oxygen requirement worsened. Creatinine worsened.  9/17, Chest x-ray showed large right-sided pleural effusion as well as pulmonary vascular congestion.  Critical care and nephrology consultation were obtained.  HD catheter was placed.  Patient was started on CRRT. 10/3, issue with CRRT cartridge leak - likely to resume in the next 24h - discussion with nephro/family on 10/4 about long term planning HD vs palliative if unable to tolerate 10/4 - Overnight issues with central line - likely need to be replaced 10/5 -not tolerating dialysis very well, patient is being set up with palliative care per previous documentation 10/6 -transitioning to hospice/palliative/comfort measures, discontinue all telemetry transition to [address], remove visitation restrictions, expected demise and hospitalized patient does not wish to leave for hospice house or home. 10/8 -patient continues to be more somnolent today, appears uncomfortable at bedside minimally tachypneic, offered pain  medication to help alleviate respiratory symptoms and patient is agreeable, increase Dilaudid as necessary to improve respiratory symptoms and continue comfort measures.  Family at bedside agrees that he does appear more comfortable after pain medication has been given.  Subjective: No acute issues or events overnight, patient somewhat hesitant to accept pain medication with discussion with palliative care however after further discussion with nursing staff at bedside today patient agreed for low-dose Dilaudid with me and will hopefully be agreeable to increase doses as needed for comfort.  Assessment/Plan: Principal Problem:   Pericardial effusion Active Problems:   Hyperlipidemia   COPD (chronic obstructive pulmonary disease) (HCC)   Controlled type 2 diabetes mellitus with diabetic peripheral angiopathy without gangrene, with long-term current use of insulin (HCC)   Essential hypertension   Terminal care   Acute respiratory failure with hypoxemia (HCC)   COPD (chronic obstructive pulmonary disease) with emphysema (HCC)   CKD (chronic kidney disease), stage III   Controlled type 2 diabetes mellitus with hyperglycemia, without long-term current use of insulin (HCC)   Acute on chronic combined systolic and diastolic CHF (congestive heart failure) (HCC)   Acute on chronic congestive heart failure (HCC)   Pericardial tamponade   DNR (do not resuscitate)   Palliative care by specialist   Goals of care - Patient transitioning to full comfort measures at this point, palliative following we appreciate insight recommendations.  Family at bedside, discontinue all labs, telemetry monitoring and vitals.  Focus on comfort measures, expected hospital demise. - Increase Dilaudid as needed for respiratory distress.  Uncontrolled diabetes mellitus 2 with peripheral neuropathy - A1c 7.3 on 9/9 - Well controlled on Lantus 30 units twice daily, scheduled NovoLog Premeal at 12 units 3  times daily and  sliding-scale insulin.  Continue Accu-Cheks - Continue Neurontin 300 mg 3 times daily.  Acute hypoxemic respiratory failure, improving - Multifactorial: CHF, pleural effusion, pericardial effusion as below  Acute exacerbation chronic combined systolic and diastolic CHF Essential hypertension - EF 35 to 40% - Patient remains volume overloaded.  CRRT currently on hold due to line issues, attempt to transition to hemodialysis as below.   - Heart failure team following -appreciate insight and recommendations.   AKI on CKD stage IIIb - Likely cardiorenal.  Nephrology following.  Patient is on CRRT.  Creatinine trend as below.  Bilateral pleural effusions - Improved pleural effusion on chest x-ray repeated on 9/20.  Pericardial effusion w/ tamponade  - Status post pericardiocentesis on 9/13, stable  A. fib, rate controlled - On amiodarone.  Remains in A. fib.   - Currently off anticoagulation because of hemorrhagic pericarditis. - Not a candidate for DCCV per cardiology.  Hyperkalemia/hyperphosphatemia, stable - Related to renal failure.   - Trend as below  Hyponatremia, improving -Improving with CRRT, defer to nephrology for further management  Thrombocytopenia -Platelet level running low but fluctuating.  PF4 antibody sent. No immediate indication of argatroban.  Elevated liver enzymes, minimally improving - Negative acute hepatitis panel.  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  Pressure injury of skin  - Per documentation of wound care/nursing  Muscular deconditioning - PT/OT consults  Mobility: PT OT eval. encourage ambulation.   Code Status:   Code Status: DNR  Nutritional status: Body mass index is 28.32 kg/m. Nutrition Problem: Increased nutrient needs Etiology: acute illness (AKI on  CRRT) Signs/Symptoms: estimated needs Diet Order            Diet regular Room service appropriate? Yes; Fluid consistency: Thin  Diet effective now                 DVT prophylaxis: Place and maintain sequential compression device Start: 06/03/20 0900 Place TED hose Start: 05/31/20 0933 SCD's Start: 05/20/2020 1942   Antimicrobials:  None Fluid: None Consultants: Nephrology, cardiology Family Communication:  None present  Status is: Inpatient Remains inpatient appropriate because: Patient is on CRRT transitioning to HD  Dispo: The patient is from: Home              Anticipated d/c is: Hospital demise              Patient currently is medically stable for ongoing hospice and palliative care.  Infusions:   sodium chloride     sodium chloride      Scheduled Meds:  fluticasone  1 spray Each Nare Daily   glycopyrrolate  0.2 mg Intravenous BID   mouth rinse  15 mL Mouth Rinse BID   melatonin  3 mg Oral QHS   midodrine  15 mg Oral TID WC   mometasone-formoterol  2 puff Inhalation BID   tamsulosin  0.4 mg Oral Daily    Antimicrobials: Anti-infectives (From admission, onward)   None      PRN meds: sodium chloride, sodium chloride, acetaminophen, albuterol, alteplase, camphor-menthol, HYDROmorphone (DILAUDID) injection, lidocaine (PF), lidocaine-prilocaine, loperamide, LORazepam, Muscle Rub, ondansetron (ZOFRAN) IV, oxyCODONE, pentafluoroprop-tetrafluoroeth, sodium chloride flush   Objective: Vitals:   06/17/20 0850 Jul 13, 2020 0457  BP: (!) 83/60 (!) 84/53  Pulse: 85 81  Resp: 18 15  Temp: 98 F (36.7 C) 97.6 F (36.4  C)  SpO2: 92% 97%    Intake/Output Summary (Last 24 hours) at 2020/07/09 0818 Last data filed at 09-Jul-2020 0503 Gross per 24 hour  Intake 340 ml  Output --  Net 340 ml   Filed Weights   06/15/20 0700 06/15/20 1015 06/16/20 0400  Weight: 94.2 kg 93.6 kg 94.7 kg   Weight change:  Body mass index is 28.32 kg/m.   Physical  Exam: General exam: Appears fatigued, minimally tachypneic, moderately lethargic but ANO x4 Skin: No rashes, lesions or ulcers. HEENT: Atraumatic, normocephalic, large venous catheter right IJ bandage clean dry intact Lungs: Diminished air entry in bases. Remains on supplemental oxygen. CVS: Minimally tachycardic A. fib  GI/Abd soft, nontender, nondistended, bowel sound present CNS: Alert, awake, oriented to place and person Psychiatry: Mood appropriate, somewhat depressed Extremities: 2+ pedal edema bilaterally.  No calf tenderness  Data Review: I have personally reviewed the laboratory data and studies available.  Recent Labs  Lab 06/12/20 0351 06/13/20 0313 06/14/20 0330 06/15/20 0205  WBC 11.1* 10.5 11.4* 12.3*  HGB 10.6* 10.6* 10.9* 11.4*  HCT 33.2* 35.6* 35.1* 35.8*  MCV 97.4 99.4 98.9 97.5  PLT 94* 91* 123* 190   Recent Labs  Lab 06/12/20 0351 06/12/20 0351 06/12/20 1606 06/13/20 0313 06/13/20 1932 06/14/20 0330 06/15/20 0205  NA 131*   < > 131* 129* 130* 131*   130* 126*  K 4.8   < > 4.7 5.2* 5.1 5.5*   5.3* 5.8*  CL 98   < > 96* 95* 96* 96*   97* 92*  CO2 24   < > 24 25 24 29   25 22   GLUCOSE 91   < > 243* 207* 276* 168*   171* 256*  BUN 27*   < > 35* 36* 41* 35*   37* 69*  CREATININE 1.51*   < > 1.90* 2.20* 2.03* 2.00*   1.95* 2.96*  CALCIUM 8.7*   < > 8.6* 8.9 8.5* 8.8*   8.8* 9.6  MG 2.6*  --   --  2.7*  --  2.6*  --   PHOS 2.9   < > 3.5 3.7 3.6 3.4 5.9*   < > = values in this interval not displayed.    Signed, Little Ishikawa, DO [facility]  Pager: Epic chat  07-09-2020

## 2020-07-12 NOTE — Progress Notes (Signed)
HOSPITAL MEDICINE OVERNIGHT EVENT NOTE    Notified by nursing that patient has become unresponsive and has lost pulse and breath sounds.  Time of death 9:47 PM, pronounced by nursing.  Patient passed with family at bedside.  Patient was comfort measures and DNR and therefore ACLS measures were not initiated.  Nursing has already contacted donor services and patient placement for next steps.  Death certificate will be electronically sent to the attending provider for completion.  Vernelle Emerald  MD [facility]

## 2020-07-12 NOTE — Progress Notes (Signed)
This RN called to bedside by family concerning pt has passed. Pt DNR , no pulse, bilateral lung sounds auscultated- no sounds heard. Time of death 2145-12-10, pronounced by Beverline and Ny'Che, RN. Family at bedside, Son and Daughter. Physician on call notified, Inda Merlin, MD. [facility] notified and patient belongings given to family.

## 2020-07-12 NOTE — Death Summary Note (Signed)
Death Summary  Robert [name] EHU:[id number] DOB: 08/07/1951 DOA: 13-Jun-2020  PCP: Redmond School, MD  Admit date: 2020-06-13 Date of Death: 2020/07/14  Final Diagnoses:  Principal Problem:   Pericardial effusion Active Problems:   Hyperlipidemia   COPD (chronic obstructive pulmonary disease) ([place])   Controlled type 2 diabetes mellitus with diabetic peripheral angiopathy without gangrene, with long-term current use of insulin (HCC)   Essential hypertension   Terminal care   Acute respiratory failure with hypoxemia (HCC)   COPD (chronic obstructive pulmonary disease) with emphysema ([place])   CKD (chronic kidney disease), stage III   Controlled type 2 diabetes mellitus with hyperglycemia, without long-term current use of insulin (HCC)   Acute on chronic combined systolic and diastolic CHF (congestive heart failure) (HCC)   Acute on chronic congestive heart failure (HCC)   Pericardial tamponade   DNR (do not resuscitate)   Palliative care by specialist   History of present illness/Hospital Course:  Robert [name] is a 69 y.o. male with PMH of insulin-dependent diabetes mitis, HTN, CAD/MI s/p multiple interventions, chronic combined systolic and diastolic CHF, CKD 3 COPD, peripheral neuropathy, rheumatoid arthritis. Patient presented to the ED on 06-13-20 with a week history of weight gain, shortness of breath.  He was in respiratory failure.  Chest x-ray showed pulmonary edema. He was admitted for acute exacerbation of CHF.  9/9, echocardiogram showed new systolic diastolic cardiomyopathy with an EF of 35 to 40%.  9/13, repeat echo showed circumferential pericardial effusion and signs of tamponade.  Underwent pericardiocentesis by CT surgery. Patient was continued on maximal dose of IV diuretics with tapering urine output.  His oxygen requirement worsened. Creatinine worsened.  9/17, Chest x-ray showed large right-sided pleural effusion as well as pulmonary vascular congestion.  Critical  care and nephrology consultation were obtained.  HD catheter was placed.  Patient was started on CRRT. 10/3, issue with CRRT cartridge leak - likely to resume in the next 24h - discussion with nephro/family on 10/4 about long term planning HD vs palliative if unable to tolerate 10/4 - Overnight issues with central line - likely need to be replaced 10/5 -not tolerating dialysis very well, patient is being set up with palliative care per previous documentation 10/6 -transitioning to hospice/palliative/comfort measures, discontinue all telemetry transition to [address], remove visitation restrictions, expected demise and hospitalized patient does not wish to leave for hospice house or home. 10/8 -patient continues to be more somnolent today, appears uncomfortable at bedside minimally tachypneic, offered pain medication to help alleviate respiratory symptoms and patient is agreeable, increase Dilaudid as necessary to improve respiratory symptoms and continue comfort measures.  Family at bedside agrees that he does appear more comfortable after pain medication has been given - patient continued to decline and subsequently passed at 21:47 pronounced by nursing staff per protocol.   Time: 21:47  Signed:  Little Ishikawa DO  [facility] 07-14-2020, 2:41 PM

## 2020-07-12 DEATH — deceased

## 2021-03-25 IMAGING — DX PORTABLE CHEST - 1 VIEW
1 series · 1 of 1 positions shown · non-contrast
Comparison: Chest x-ray 04/14/2019.

CLINICAL DATA: 67-year-old male with history of fever.

EXAM:
PORTABLE CHEST 1 VIEW

[chest ap]
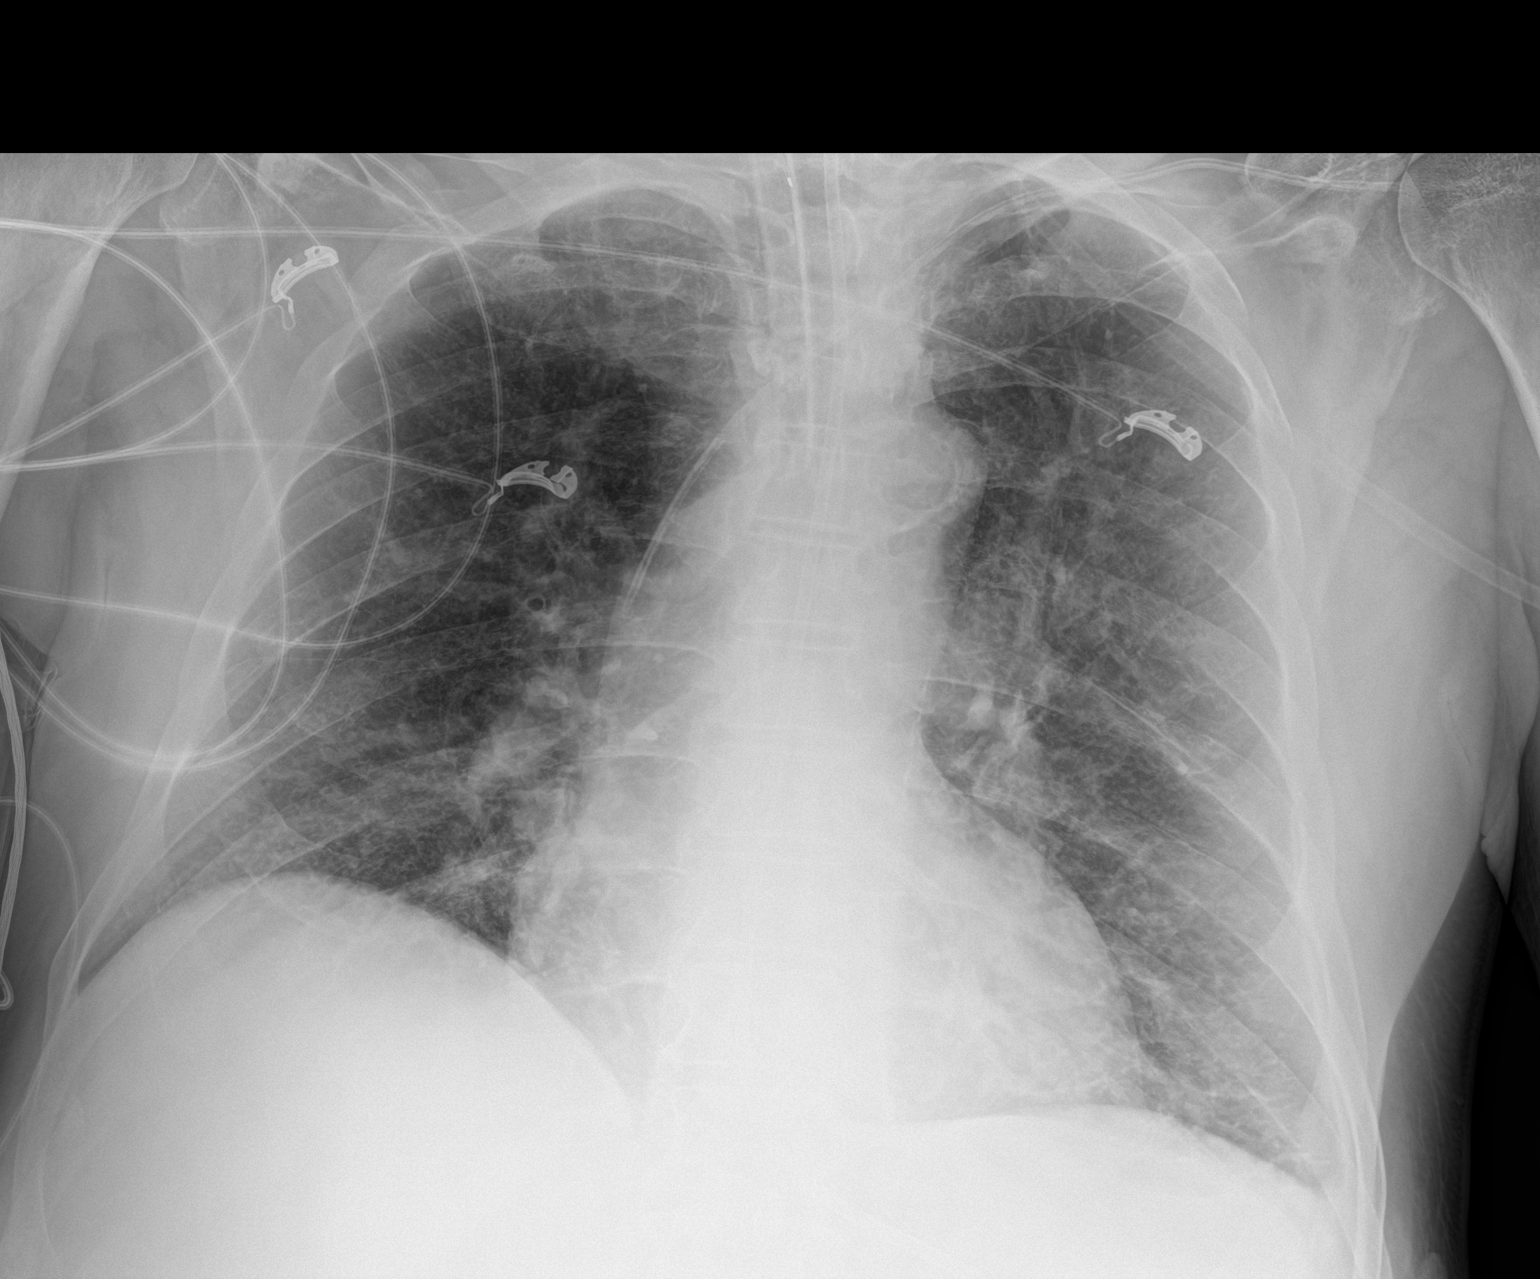

[1 of 1 positions shown; findings below may reference images not displayed]

FINDINGS: An endotracheal tube is in place with tip 3.9 cm above the carina.
There is a left-sided subclavian central venous catheter with tip
terminating in the mid superior vena cava. A feeding tube is seen
extending into the abdomen, however, the tip of the feeding tube
extends below the lower margin of the image. There continues to be
some patchy multifocal interstitial and airspace disease throughout
the mid to lower lungs bilaterally (left greater than right),
however, aeration has dramatically improved compared to the prior
study. Trace left pleural effusion. No right pleural effusion. No
evidence of pulmonary edema. Heart size is normal. Upper mediastinal
contours are within normal limits. Aortic atherosclerosis.
IMPRESSION: 1. Support apparatus, as above.
2. Persistent multilobar pneumonia with marked improved aeration
throughout the lungs bilaterally compared to the recent prior study.
3. Trace left pleural effusion.

## 2021-03-28 IMAGING — CT CT HEAD WITHOUT CONTRAST
2 of 4 series · 16 of 30 positions shown, 19 images · non-contrast
Comparison: Head CT dated 12/09/2016

CLINICAL DATA: 67-year-old male with focal neurologic deficit on
exam.

EXAM:
CT HEAD WITHOUT CONTRAST
TECHNIQUE: Contiguous axial images were obtained from the base of the skull
through the vertex without intravenous contrast.

[Series 3: head w/o thins · axial · non-contrast · 0.45mm/px · z∈[-30,+108]mm · 12 of 137 slices shown, 15 images]
[im 11/137  brain]
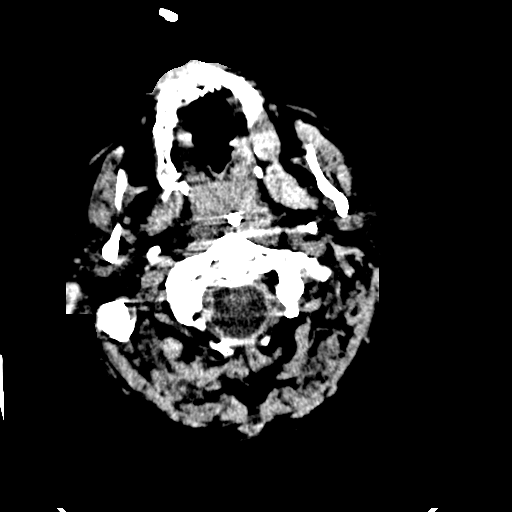
[im 11/137  bone]
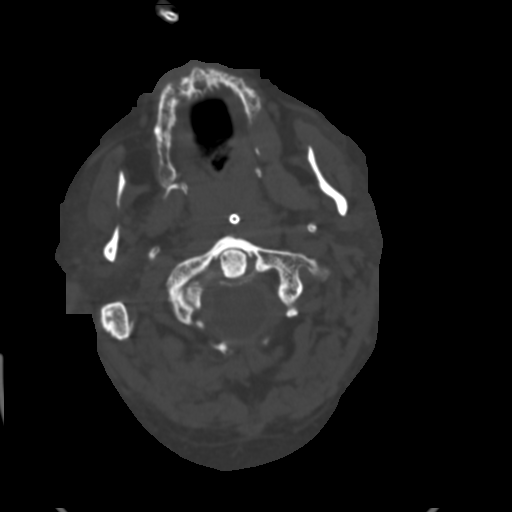
[im 21/137  brain]
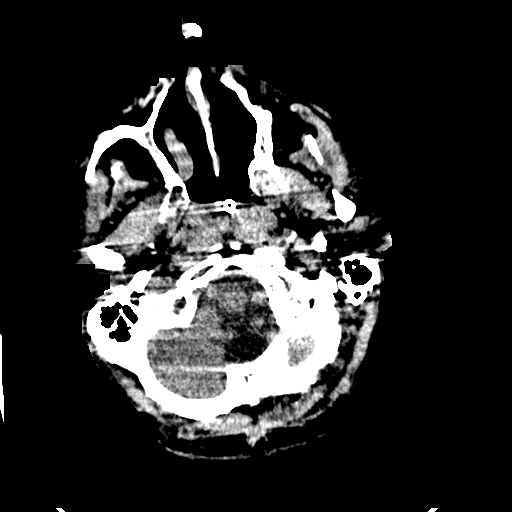
[im 32/137  brain]
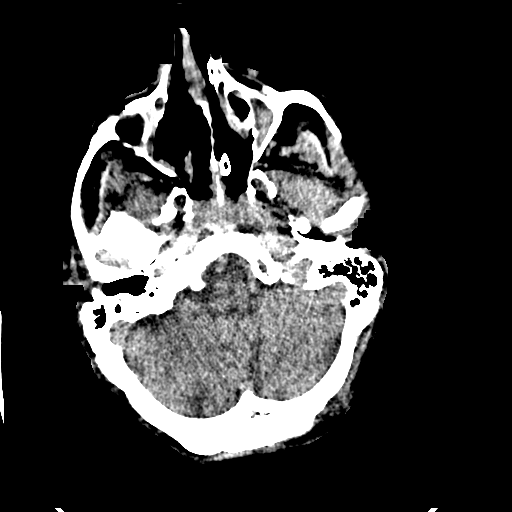
[im 42/137  brain]
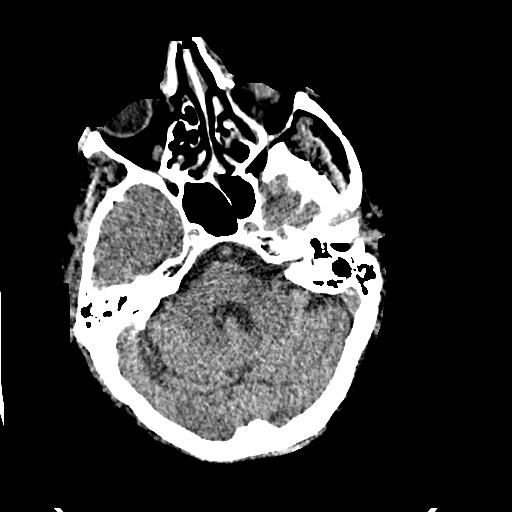
[im 53/137  brain]
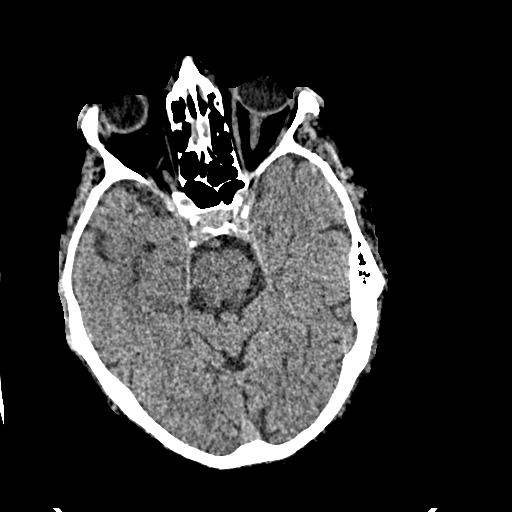
[im 53/137  bone]
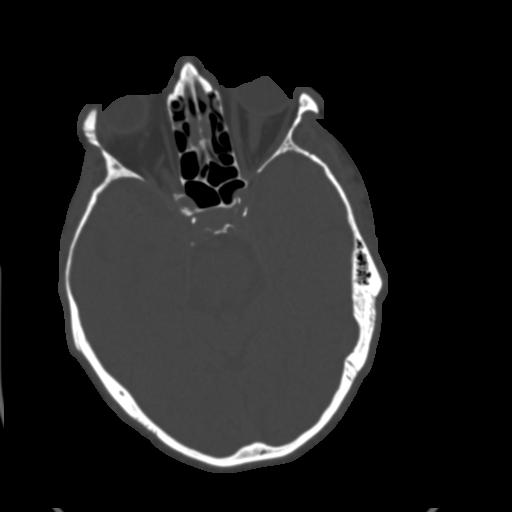
[im 63/137  brain]
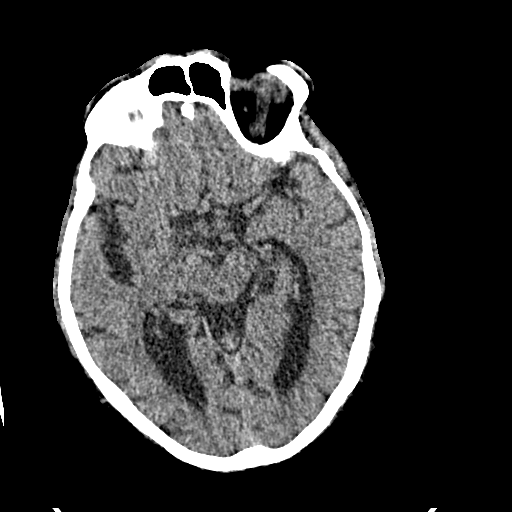
[im 74/137  brain]
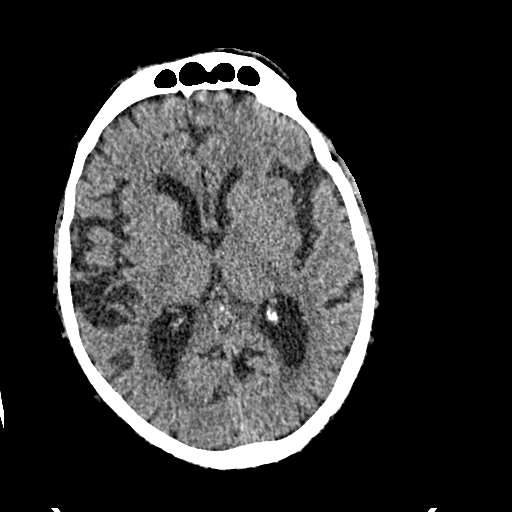
[im 84/137  brain]
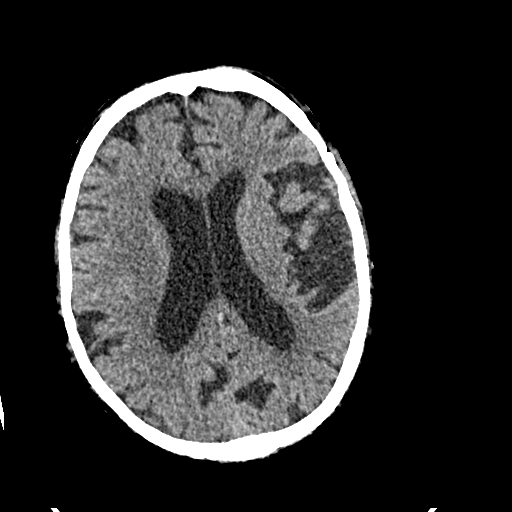
[im 95/137  brain]
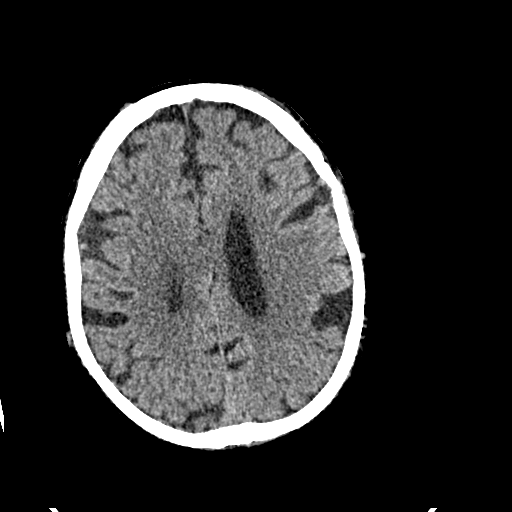
[im 95/137  bone]
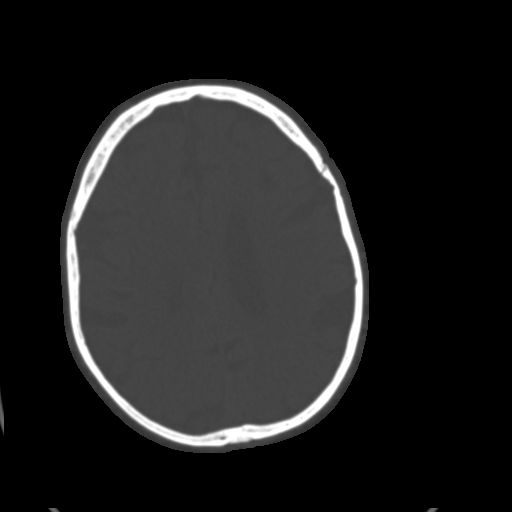
[im 105/137  brain]
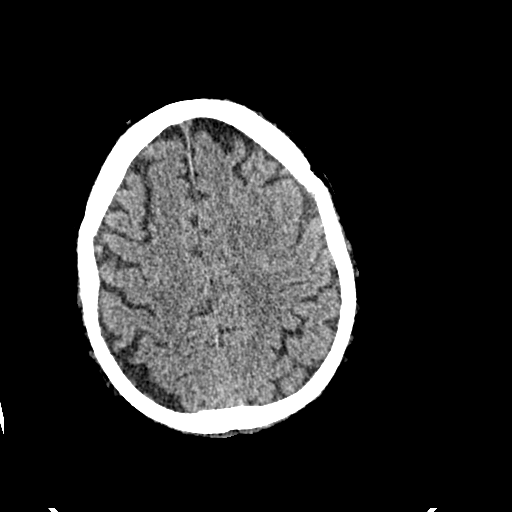
[im 116/137  brain]
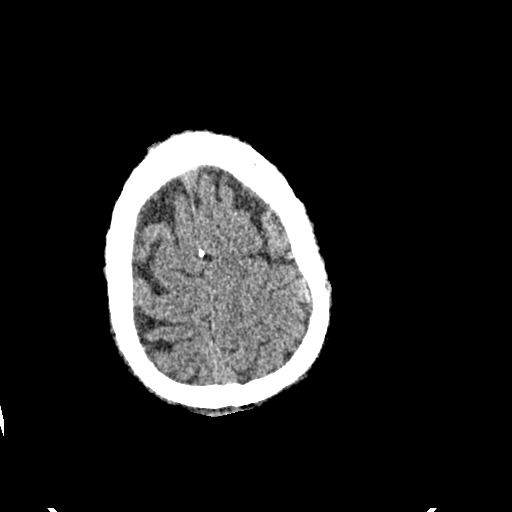
[im 126/137  brain]
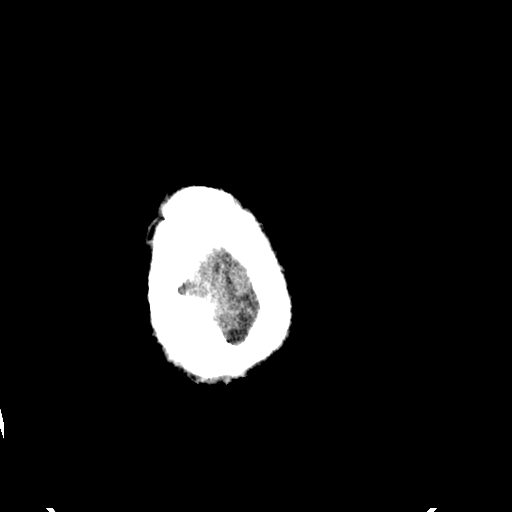

[Series 604: axial · axial · 0.45mm/px · z∈[+17,+83]mm · 4 of 79 slices shown]
[im 12/79  brain]
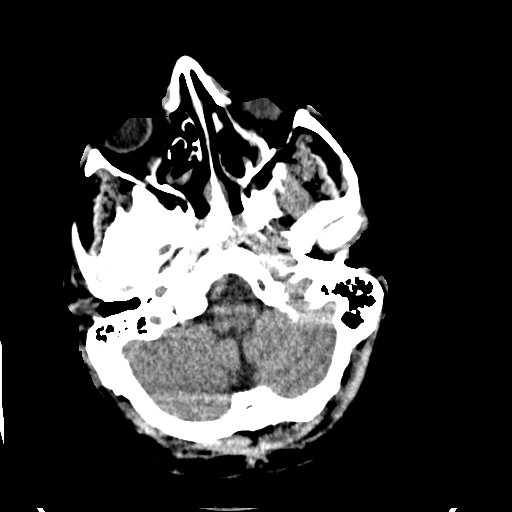
[im 23/79  brain]
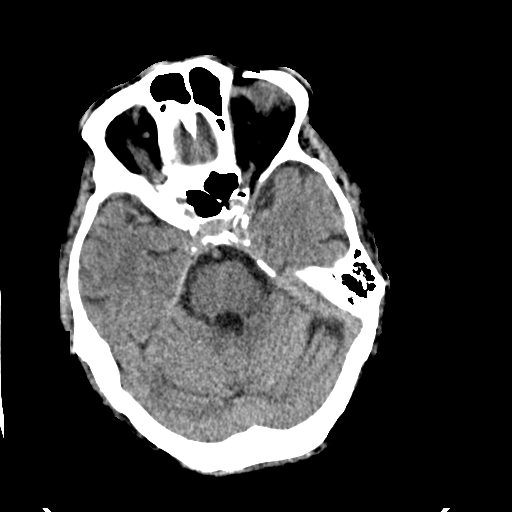
[im 34/79  brain]
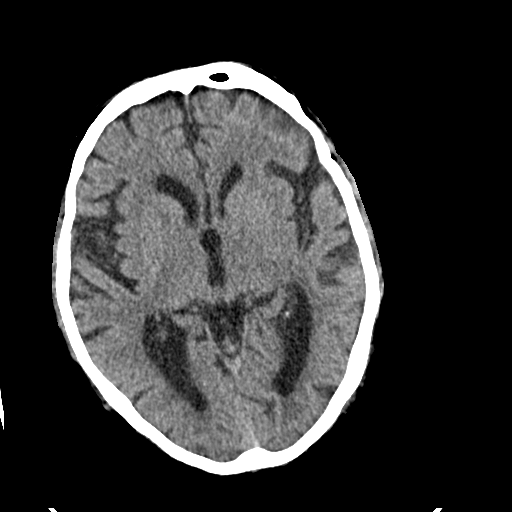
[im 45/79  brain]
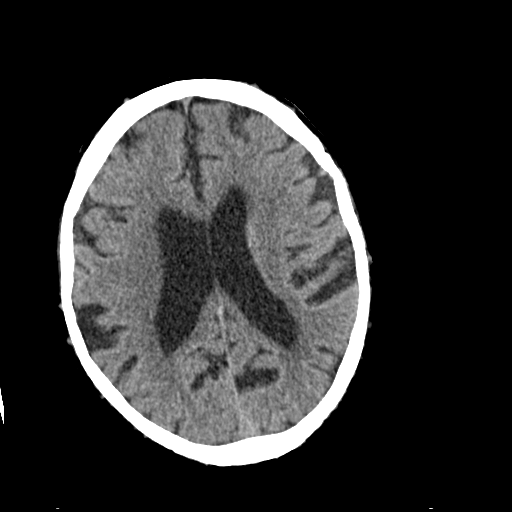

[16 of 30 positions shown; findings below may reference images not displayed]

FINDINGS: Brain: There is mild age-related atrophy and chronic microvascular
ischemic changes. Left lentiform nucleus old lacunar infarct noted.
There is no acute intracranial hemorrhage. No mass effect or midline
shift. No extra-axial fluid collection.

Vascular: No hyperdense vessel or unexpected calcification.

Skull: Normal. Negative for fracture or focal lesion.

Sinuses/Orbits: There is complete opacification of the left
maxillary sinus with chronic right maxillary sinus mucoperiosteal
thickening. No air-fluid level. The mastoid air cells are clear.

Other: An endotracheal and an enteric tube are partially visualized.
IMPRESSION: 1. No acute intracranial hemorrhage.
2. Mild age-related atrophy and chronic microvascular ischemic
changes. Left lentiform nucleus old lacunar infarct.
